# Patient Record
Sex: Female | Born: 1960 | Race: Black or African American | Hispanic: No | Marital: Single | Smoking: Former smoker
Health system: Southern US, Community
[De-identification: ages and names within clinical notes are randomized; demographics above are authoritative.]

## PROBLEM LIST (undated history)

## (undated) DIAGNOSIS — K08109 Complete loss of teeth, unspecified cause, unspecified class: Secondary | ICD-10-CM

## (undated) DIAGNOSIS — R002 Palpitations: Secondary | ICD-10-CM

## (undated) DIAGNOSIS — J449 Chronic obstructive pulmonary disease, unspecified: Secondary | ICD-10-CM

## (undated) DIAGNOSIS — I10 Essential (primary) hypertension: Secondary | ICD-10-CM

## (undated) DIAGNOSIS — D573 Sickle-cell trait: Secondary | ICD-10-CM

## (undated) DIAGNOSIS — R0602 Shortness of breath: Secondary | ICD-10-CM

## (undated) DIAGNOSIS — Z923 Personal history of irradiation: Secondary | ICD-10-CM

## (undated) DIAGNOSIS — J9620 Acute and chronic respiratory failure, unspecified whether with hypoxia or hypercapnia: Secondary | ICD-10-CM

## (undated) DIAGNOSIS — K219 Gastro-esophageal reflux disease without esophagitis: Secondary | ICD-10-CM

## (undated) DIAGNOSIS — C50919 Malignant neoplasm of unspecified site of unspecified female breast: Secondary | ICD-10-CM

## (undated) DIAGNOSIS — IMO0002 Reserved for concepts with insufficient information to code with codable children: Secondary | ICD-10-CM

## (undated) DIAGNOSIS — E663 Overweight: Secondary | ICD-10-CM

## (undated) DIAGNOSIS — D869 Sarcoidosis, unspecified: Secondary | ICD-10-CM

## (undated) DIAGNOSIS — D509 Iron deficiency anemia, unspecified: Secondary | ICD-10-CM

## (undated) DIAGNOSIS — K000 Anodontia: Secondary | ICD-10-CM

## (undated) DIAGNOSIS — F32A Depression, unspecified: Secondary | ICD-10-CM

## (undated) HISTORY — DX: Palpitations: R00.2

## (undated) HISTORY — PX: EYE SURGERY: SHX253

## (undated) HISTORY — DX: Reserved for concepts with insufficient information to code with codable children: IMO0002

## (undated) HISTORY — DX: Sarcoidosis, unspecified: D86.9

## (undated) HISTORY — DX: Essential (primary) hypertension: I10

## (undated) HISTORY — DX: Iron deficiency anemia, unspecified: D50.9

## (undated) HISTORY — DX: Overweight: E66.3

## (undated) HISTORY — DX: Acute and chronic respiratory failure, unspecified whether with hypoxia or hypercapnia: J96.20

---

## 1990-11-13 HISTORY — PX: OTHER SURGICAL HISTORY: SHX169

## 1998-08-31 ENCOUNTER — Encounter: Admission: RE | Admit: 1998-08-31 | Discharge: 1998-08-31 | Payer: Self-pay | Admitting: Family Medicine

## 1998-09-07 ENCOUNTER — Encounter: Admission: RE | Admit: 1998-09-07 | Discharge: 1998-09-07 | Payer: Self-pay | Admitting: Sports Medicine

## 1998-10-11 ENCOUNTER — Encounter: Admission: RE | Admit: 1998-10-11 | Discharge: 1998-10-11 | Payer: Self-pay | Admitting: Family Medicine

## 1999-01-12 ENCOUNTER — Encounter: Admission: RE | Admit: 1999-01-12 | Discharge: 1999-01-12 | Payer: Self-pay | Admitting: Family Medicine

## 1999-04-14 ENCOUNTER — Encounter: Admission: RE | Admit: 1999-04-14 | Discharge: 1999-04-14 | Payer: Self-pay | Admitting: Family Medicine

## 1999-04-16 ENCOUNTER — Emergency Department (HOSPITAL_COMMUNITY): Admission: EM | Admit: 1999-04-16 | Discharge: 1999-04-16 | Payer: Self-pay | Admitting: Emergency Medicine

## 1999-08-14 ENCOUNTER — Encounter: Payer: Self-pay | Admitting: Emergency Medicine

## 1999-08-14 ENCOUNTER — Emergency Department (HOSPITAL_COMMUNITY): Admission: EM | Admit: 1999-08-14 | Discharge: 1999-08-14 | Payer: Self-pay | Admitting: Emergency Medicine

## 1999-12-13 ENCOUNTER — Encounter: Admission: RE | Admit: 1999-12-13 | Discharge: 1999-12-13 | Payer: Self-pay | Admitting: Sports Medicine

## 2000-02-13 ENCOUNTER — Encounter: Admission: RE | Admit: 2000-02-13 | Discharge: 2000-02-13 | Payer: Self-pay | Admitting: Family Medicine

## 2000-07-18 ENCOUNTER — Encounter: Admission: RE | Admit: 2000-07-18 | Discharge: 2000-07-18 | Payer: Self-pay | Admitting: Family Medicine

## 2000-11-12 ENCOUNTER — Encounter: Payer: Self-pay | Admitting: Emergency Medicine

## 2000-11-12 ENCOUNTER — Emergency Department (HOSPITAL_COMMUNITY): Admission: EM | Admit: 2000-11-12 | Discharge: 2000-11-12 | Payer: Self-pay | Admitting: Emergency Medicine

## 2001-01-02 ENCOUNTER — Encounter: Admission: RE | Admit: 2001-01-02 | Discharge: 2001-01-02 | Payer: Self-pay | Admitting: Family Medicine

## 2001-03-21 ENCOUNTER — Encounter (INDEPENDENT_AMBULATORY_CARE_PROVIDER_SITE_OTHER): Payer: Self-pay | Admitting: *Deleted

## 2001-03-21 ENCOUNTER — Encounter: Admission: RE | Admit: 2001-03-21 | Discharge: 2001-03-21 | Payer: Self-pay | Admitting: Family Medicine

## 2001-03-22 ENCOUNTER — Other Ambulatory Visit: Admission: RE | Admit: 2001-03-22 | Discharge: 2001-03-22 | Payer: Self-pay | Admitting: Family Medicine

## 2001-06-24 ENCOUNTER — Emergency Department (HOSPITAL_COMMUNITY): Admission: EM | Admit: 2001-06-24 | Discharge: 2001-06-24 | Payer: Self-pay | Admitting: *Deleted

## 2001-06-25 ENCOUNTER — Encounter: Payer: Self-pay | Admitting: Emergency Medicine

## 2001-07-01 ENCOUNTER — Encounter: Admission: RE | Admit: 2001-07-01 | Discharge: 2001-07-01 | Payer: Self-pay | Admitting: Family Medicine

## 2001-08-20 ENCOUNTER — Encounter: Admission: RE | Admit: 2001-08-20 | Discharge: 2001-08-20 | Payer: Self-pay | Admitting: Family Medicine

## 2001-08-20 ENCOUNTER — Encounter: Payer: Self-pay | Admitting: *Deleted

## 2001-08-20 ENCOUNTER — Encounter: Admission: RE | Admit: 2001-08-20 | Discharge: 2001-08-20 | Payer: Self-pay | Admitting: *Deleted

## 2001-09-05 ENCOUNTER — Encounter: Admission: RE | Admit: 2001-09-05 | Discharge: 2001-09-05 | Payer: Self-pay | Admitting: Family Medicine

## 2001-11-22 ENCOUNTER — Encounter: Admission: RE | Admit: 2001-11-22 | Discharge: 2001-11-22 | Payer: Self-pay | Admitting: Family Medicine

## 2001-12-15 ENCOUNTER — Emergency Department (HOSPITAL_COMMUNITY): Admission: EM | Admit: 2001-12-15 | Discharge: 2001-12-16 | Payer: Self-pay | Admitting: Emergency Medicine

## 2001-12-25 ENCOUNTER — Encounter: Admission: RE | Admit: 2001-12-25 | Discharge: 2001-12-25 | Payer: Self-pay | Admitting: Family Medicine

## 2002-04-10 ENCOUNTER — Encounter: Admission: RE | Admit: 2002-04-10 | Discharge: 2002-04-10 | Payer: Self-pay | Admitting: Family Medicine

## 2002-04-22 ENCOUNTER — Encounter: Admission: RE | Admit: 2002-04-22 | Discharge: 2002-04-22 | Payer: Self-pay | Admitting: Family Medicine

## 2002-05-14 ENCOUNTER — Encounter: Admission: RE | Admit: 2002-05-14 | Discharge: 2002-05-14 | Payer: Self-pay | Admitting: Family Medicine

## 2002-06-10 ENCOUNTER — Encounter: Admission: RE | Admit: 2002-06-10 | Discharge: 2002-06-10 | Payer: Self-pay | Admitting: Sports Medicine

## 2002-10-28 ENCOUNTER — Encounter: Admission: RE | Admit: 2002-10-28 | Discharge: 2002-10-28 | Payer: Self-pay | Admitting: Family Medicine

## 2002-11-01 ENCOUNTER — Encounter: Payer: Self-pay | Admitting: Emergency Medicine

## 2002-11-01 ENCOUNTER — Emergency Department (HOSPITAL_COMMUNITY): Admission: EM | Admit: 2002-11-01 | Discharge: 2002-11-01 | Payer: Self-pay

## 2003-04-01 ENCOUNTER — Encounter: Admission: RE | Admit: 2003-04-01 | Discharge: 2003-04-01 | Payer: Self-pay | Admitting: Family Medicine

## 2003-05-15 ENCOUNTER — Encounter: Admission: RE | Admit: 2003-05-15 | Discharge: 2003-05-15 | Payer: Self-pay | Admitting: Family Medicine

## 2003-08-05 ENCOUNTER — Emergency Department (HOSPITAL_COMMUNITY): Admission: EM | Admit: 2003-08-05 | Discharge: 2003-08-05 | Payer: Self-pay

## 2003-08-05 ENCOUNTER — Encounter: Payer: Self-pay | Admitting: Emergency Medicine

## 2003-08-11 ENCOUNTER — Encounter: Admission: RE | Admit: 2003-08-11 | Discharge: 2003-08-11 | Payer: Self-pay | Admitting: Sports Medicine

## 2004-01-01 ENCOUNTER — Encounter: Admission: RE | Admit: 2004-01-01 | Discharge: 2004-01-01 | Payer: Self-pay | Admitting: Family Medicine

## 2004-01-11 ENCOUNTER — Encounter: Admission: RE | Admit: 2004-01-11 | Discharge: 2004-01-11 | Payer: Self-pay | Admitting: Family Medicine

## 2004-06-06 ENCOUNTER — Encounter: Admission: RE | Admit: 2004-06-06 | Discharge: 2004-06-06 | Payer: Self-pay | Admitting: Family Medicine

## 2004-06-16 ENCOUNTER — Encounter: Admission: RE | Admit: 2004-06-16 | Discharge: 2004-06-16 | Payer: Self-pay | Admitting: Family Medicine

## 2004-07-07 ENCOUNTER — Encounter: Admission: RE | Admit: 2004-07-07 | Discharge: 2004-07-07 | Payer: Self-pay | Admitting: Sports Medicine

## 2004-10-17 ENCOUNTER — Ambulatory Visit: Payer: Self-pay | Admitting: Family Medicine

## 2004-10-25 ENCOUNTER — Emergency Department (HOSPITAL_COMMUNITY): Admission: EM | Admit: 2004-10-25 | Discharge: 2004-10-25 | Payer: Self-pay | Admitting: Family Medicine

## 2004-11-17 ENCOUNTER — Ambulatory Visit: Payer: Self-pay | Admitting: Internal Medicine

## 2004-11-25 ENCOUNTER — Ambulatory Visit: Payer: Self-pay | Admitting: Family Medicine

## 2005-07-25 ENCOUNTER — Ambulatory Visit: Payer: Self-pay | Admitting: Family Medicine

## 2005-08-04 ENCOUNTER — Ambulatory Visit (HOSPITAL_COMMUNITY): Admission: RE | Admit: 2005-08-04 | Discharge: 2005-08-04 | Payer: Self-pay | Admitting: Sports Medicine

## 2005-08-24 ENCOUNTER — Ambulatory Visit: Payer: Self-pay | Admitting: Family Medicine

## 2005-09-26 ENCOUNTER — Ambulatory Visit (HOSPITAL_COMMUNITY): Admission: RE | Admit: 2005-09-26 | Discharge: 2005-09-26 | Payer: Self-pay | Admitting: Obstetrics & Gynecology

## 2005-10-02 ENCOUNTER — Ambulatory Visit: Payer: Self-pay | Admitting: Internal Medicine

## 2005-10-31 ENCOUNTER — Ambulatory Visit (HOSPITAL_COMMUNITY): Admission: RE | Admit: 2005-10-31 | Discharge: 2005-10-31 | Payer: Self-pay | Admitting: Obstetrics & Gynecology

## 2005-11-16 ENCOUNTER — Ambulatory Visit: Payer: Self-pay | Admitting: Internal Medicine

## 2005-11-30 ENCOUNTER — Ambulatory Visit: Payer: Self-pay | Admitting: Family Medicine

## 2005-12-20 ENCOUNTER — Ambulatory Visit: Payer: Self-pay | Admitting: Internal Medicine

## 2005-12-20 ENCOUNTER — Ambulatory Visit (HOSPITAL_COMMUNITY): Admission: RE | Admit: 2005-12-20 | Discharge: 2005-12-20 | Payer: Self-pay | Admitting: Obstetrics & Gynecology

## 2006-01-24 ENCOUNTER — Ambulatory Visit: Payer: Self-pay | Admitting: Family Medicine

## 2006-01-26 ENCOUNTER — Encounter: Admission: RE | Admit: 2006-01-26 | Discharge: 2006-01-26 | Payer: Self-pay | Admitting: Family Medicine

## 2006-02-05 ENCOUNTER — Ambulatory Visit: Payer: Self-pay | Admitting: Family Medicine

## 2006-03-13 ENCOUNTER — Ambulatory Visit: Payer: Self-pay | Admitting: Family Medicine

## 2006-03-20 ENCOUNTER — Ambulatory Visit: Payer: Self-pay | Admitting: Sports Medicine

## 2006-03-20 ENCOUNTER — Ambulatory Visit (HOSPITAL_COMMUNITY): Admission: RE | Admit: 2006-03-20 | Discharge: 2006-03-20 | Payer: Self-pay | Admitting: Family Medicine

## 2006-04-02 ENCOUNTER — Ambulatory Visit: Payer: Self-pay | Admitting: Family Medicine

## 2006-04-05 ENCOUNTER — Ambulatory Visit: Payer: Self-pay | Admitting: Sports Medicine

## 2006-06-22 ENCOUNTER — Ambulatory Visit: Payer: Self-pay | Admitting: Family Medicine

## 2006-08-10 ENCOUNTER — Ambulatory Visit: Payer: Self-pay | Admitting: Family Medicine

## 2006-10-18 ENCOUNTER — Ambulatory Visit: Payer: Self-pay | Admitting: Family Medicine

## 2006-10-19 ENCOUNTER — Encounter (INDEPENDENT_AMBULATORY_CARE_PROVIDER_SITE_OTHER): Payer: Self-pay | Admitting: *Deleted

## 2006-10-19 LAB — CONVERTED CEMR LAB

## 2006-12-07 ENCOUNTER — Ambulatory Visit: Payer: Self-pay | Admitting: Family Medicine

## 2006-12-07 ENCOUNTER — Encounter: Payer: Self-pay | Admitting: Family Medicine

## 2006-12-21 ENCOUNTER — Ambulatory Visit: Payer: Self-pay | Admitting: Family Medicine

## 2007-01-10 DIAGNOSIS — D869 Sarcoidosis, unspecified: Secondary | ICD-10-CM | POA: Insufficient documentation

## 2007-01-10 DIAGNOSIS — D259 Leiomyoma of uterus, unspecified: Secondary | ICD-10-CM | POA: Insufficient documentation

## 2007-01-10 DIAGNOSIS — M545 Low back pain, unspecified: Secondary | ICD-10-CM | POA: Insufficient documentation

## 2007-01-10 DIAGNOSIS — I10 Essential (primary) hypertension: Secondary | ICD-10-CM | POA: Insufficient documentation

## 2007-01-10 DIAGNOSIS — D509 Iron deficiency anemia, unspecified: Secondary | ICD-10-CM | POA: Insufficient documentation

## 2007-01-10 DIAGNOSIS — J309 Allergic rhinitis, unspecified: Secondary | ICD-10-CM | POA: Insufficient documentation

## 2007-01-10 DIAGNOSIS — J449 Chronic obstructive pulmonary disease, unspecified: Secondary | ICD-10-CM | POA: Insufficient documentation

## 2007-01-11 ENCOUNTER — Encounter (INDEPENDENT_AMBULATORY_CARE_PROVIDER_SITE_OTHER): Payer: Self-pay | Admitting: *Deleted

## 2007-02-11 ENCOUNTER — Telehealth: Payer: Self-pay | Admitting: *Deleted

## 2007-02-21 ENCOUNTER — Telehealth: Payer: Self-pay | Admitting: *Deleted

## 2007-03-07 ENCOUNTER — Encounter: Payer: Self-pay | Admitting: Family Medicine

## 2007-03-07 ENCOUNTER — Ambulatory Visit: Payer: Self-pay | Admitting: Sports Medicine

## 2007-03-07 LAB — CONVERTED CEMR LAB
BUN: 8 mg/dL (ref 6–23)
Blood in Urine, dipstick: NEGATIVE
Calcium: 9.3 mg/dL (ref 8.4–10.5)
Creatinine, Ser: 0.73 mg/dL (ref 0.40–1.20)
Glucose, Urine, Semiquant: NEGATIVE
Ketones, urine, test strip: NEGATIVE
Specific Gravity, Urine: 1.01
pH: 7.5

## 2007-03-18 ENCOUNTER — Encounter: Payer: Self-pay | Admitting: Family Medicine

## 2007-04-23 ENCOUNTER — Telehealth (INDEPENDENT_AMBULATORY_CARE_PROVIDER_SITE_OTHER): Payer: Self-pay | Admitting: *Deleted

## 2007-04-23 ENCOUNTER — Telehealth: Payer: Self-pay | Admitting: *Deleted

## 2007-05-15 ENCOUNTER — Ambulatory Visit: Payer: Self-pay | Admitting: Family Medicine

## 2007-05-15 LAB — CONVERTED CEMR LAB: Whiff Test: NEGATIVE

## 2007-06-10 ENCOUNTER — Ambulatory Visit: Payer: Self-pay | Admitting: Family Medicine

## 2007-06-27 ENCOUNTER — Telehealth (INDEPENDENT_AMBULATORY_CARE_PROVIDER_SITE_OTHER): Payer: Self-pay | Admitting: *Deleted

## 2007-07-22 ENCOUNTER — Telehealth: Payer: Self-pay | Admitting: *Deleted

## 2007-07-24 ENCOUNTER — Emergency Department (HOSPITAL_COMMUNITY): Admission: EM | Admit: 2007-07-24 | Discharge: 2007-07-25 | Payer: Self-pay | Admitting: Emergency Medicine

## 2007-08-05 ENCOUNTER — Ambulatory Visit: Payer: Self-pay | Admitting: Family Medicine

## 2007-08-16 ENCOUNTER — Encounter: Admission: RE | Admit: 2007-08-16 | Discharge: 2007-08-16 | Payer: Self-pay | Admitting: Sports Medicine

## 2007-08-19 ENCOUNTER — Encounter (INDEPENDENT_AMBULATORY_CARE_PROVIDER_SITE_OTHER): Payer: Self-pay | Admitting: Family Medicine

## 2007-09-30 ENCOUNTER — Telehealth: Payer: Self-pay | Admitting: *Deleted

## 2007-10-25 ENCOUNTER — Ambulatory Visit: Payer: Self-pay | Admitting: Family Medicine

## 2007-11-25 ENCOUNTER — Ambulatory Visit: Payer: Self-pay | Admitting: Sports Medicine

## 2007-11-25 ENCOUNTER — Encounter (INDEPENDENT_AMBULATORY_CARE_PROVIDER_SITE_OTHER): Payer: Self-pay | Admitting: Family Medicine

## 2007-11-27 ENCOUNTER — Encounter: Admission: RE | Admit: 2007-11-27 | Discharge: 2007-11-27 | Payer: Self-pay | Admitting: Family Medicine

## 2007-11-28 ENCOUNTER — Telehealth: Payer: Self-pay | Admitting: *Deleted

## 2007-11-28 DIAGNOSIS — N840 Polyp of corpus uteri: Secondary | ICD-10-CM | POA: Insufficient documentation

## 2007-12-02 ENCOUNTER — Encounter (INDEPENDENT_AMBULATORY_CARE_PROVIDER_SITE_OTHER): Payer: Self-pay | Admitting: *Deleted

## 2007-12-18 ENCOUNTER — Ambulatory Visit: Payer: Self-pay | Admitting: Internal Medicine

## 2007-12-24 ENCOUNTER — Encounter: Payer: Self-pay | Admitting: Internal Medicine

## 2007-12-25 ENCOUNTER — Emergency Department (HOSPITAL_COMMUNITY): Admission: EM | Admit: 2007-12-25 | Discharge: 2007-12-25 | Payer: Self-pay | Admitting: Emergency Medicine

## 2008-01-02 ENCOUNTER — Encounter: Payer: Self-pay | Admitting: Internal Medicine

## 2008-01-03 ENCOUNTER — Encounter (INDEPENDENT_AMBULATORY_CARE_PROVIDER_SITE_OTHER): Payer: Self-pay | Admitting: Family Medicine

## 2008-01-08 ENCOUNTER — Encounter (INDEPENDENT_AMBULATORY_CARE_PROVIDER_SITE_OTHER): Payer: Self-pay | Admitting: Family Medicine

## 2008-02-19 ENCOUNTER — Ambulatory Visit: Payer: Self-pay | Admitting: Internal Medicine

## 2008-03-26 ENCOUNTER — Ambulatory Visit: Payer: Self-pay | Admitting: Family Medicine

## 2008-03-26 ENCOUNTER — Encounter: Payer: Self-pay | Admitting: Family Medicine

## 2008-03-26 ENCOUNTER — Telehealth: Payer: Self-pay | Admitting: *Deleted

## 2008-03-27 ENCOUNTER — Telehealth: Payer: Self-pay | Admitting: *Deleted

## 2008-03-27 LAB — CONVERTED CEMR LAB
BUN: 9 mg/dL (ref 6–23)
MCHC: 31.5 g/dL (ref 30.0–36.0)
Potassium: 4.1 meq/L (ref 3.5–5.3)
RDW: 17.5 % — ABNORMAL HIGH (ref 11.5–15.5)
Sodium: 139 meq/L (ref 135–145)

## 2008-04-01 ENCOUNTER — Ambulatory Visit: Payer: Self-pay | Admitting: Internal Medicine

## 2008-04-15 ENCOUNTER — Encounter (INDEPENDENT_AMBULATORY_CARE_PROVIDER_SITE_OTHER): Payer: Self-pay | Admitting: Family Medicine

## 2008-04-15 ENCOUNTER — Ambulatory Visit: Payer: Self-pay | Admitting: Family Medicine

## 2008-04-15 DIAGNOSIS — D573 Sickle-cell trait: Secondary | ICD-10-CM | POA: Insufficient documentation

## 2008-04-15 LAB — CONVERTED CEMR LAB: Hemoglobin: 10.4 g/dL

## 2008-05-01 ENCOUNTER — Encounter (INDEPENDENT_AMBULATORY_CARE_PROVIDER_SITE_OTHER): Payer: Self-pay | Admitting: Family Medicine

## 2008-05-06 ENCOUNTER — Telehealth: Payer: Self-pay | Admitting: *Deleted

## 2008-06-11 ENCOUNTER — Telehealth: Payer: Self-pay | Admitting: *Deleted

## 2008-06-17 ENCOUNTER — Ambulatory Visit: Payer: Self-pay | Admitting: Family Medicine

## 2008-06-17 LAB — CONVERTED CEMR LAB: Hemoglobin: 11 g/dL

## 2008-06-23 ENCOUNTER — Telehealth (INDEPENDENT_AMBULATORY_CARE_PROVIDER_SITE_OTHER): Payer: Self-pay | Admitting: Family Medicine

## 2008-06-25 ENCOUNTER — Encounter: Payer: Self-pay | Admitting: Internal Medicine

## 2008-06-25 ENCOUNTER — Ambulatory Visit: Payer: Self-pay | Admitting: Internal Medicine

## 2008-06-26 ENCOUNTER — Ambulatory Visit: Payer: Self-pay | Admitting: Internal Medicine

## 2008-11-10 ENCOUNTER — Encounter (INDEPENDENT_AMBULATORY_CARE_PROVIDER_SITE_OTHER): Payer: Self-pay | Admitting: Family Medicine

## 2008-11-10 ENCOUNTER — Ambulatory Visit: Payer: Self-pay | Admitting: Family Medicine

## 2008-11-10 LAB — CONVERTED CEMR LAB: HDL: 60 mg/dL (ref >39)

## 2008-11-23 ENCOUNTER — Encounter (INDEPENDENT_AMBULATORY_CARE_PROVIDER_SITE_OTHER): Payer: Self-pay | Admitting: Family Medicine

## 2008-11-23 ENCOUNTER — Ambulatory Visit: Payer: Self-pay | Admitting: Gastroenterology

## 2008-11-30 ENCOUNTER — Ambulatory Visit: Payer: Self-pay | Admitting: Gastroenterology

## 2008-12-01 ENCOUNTER — Ambulatory Visit: Payer: Self-pay | Admitting: Family Medicine

## 2008-12-01 ENCOUNTER — Encounter (INDEPENDENT_AMBULATORY_CARE_PROVIDER_SITE_OTHER): Payer: Self-pay | Admitting: Family Medicine

## 2008-12-01 ENCOUNTER — Telehealth (INDEPENDENT_AMBULATORY_CARE_PROVIDER_SITE_OTHER): Payer: Self-pay | Admitting: Family Medicine

## 2008-12-02 LAB — CONVERTED CEMR LAB
HCT: 35.6 % — ABNORMAL LOW (ref 36.0–46.0)
MCV: 74 fL — ABNORMAL LOW (ref 78.0–100.0)
Platelets: 386 10*3/uL (ref 150–400)
WBC: 8.7 10*3/uL (ref 4.0–10.5)

## 2008-12-03 ENCOUNTER — Telehealth (INDEPENDENT_AMBULATORY_CARE_PROVIDER_SITE_OTHER): Payer: Self-pay | Admitting: *Deleted

## 2008-12-08 ENCOUNTER — Ambulatory Visit: Payer: Self-pay | Admitting: Internal Medicine

## 2009-01-01 ENCOUNTER — Encounter (INDEPENDENT_AMBULATORY_CARE_PROVIDER_SITE_OTHER): Payer: Self-pay | Admitting: Family Medicine

## 2009-01-01 ENCOUNTER — Ambulatory Visit: Payer: Self-pay | Admitting: Family Medicine

## 2009-01-04 ENCOUNTER — Telehealth (INDEPENDENT_AMBULATORY_CARE_PROVIDER_SITE_OTHER): Payer: Self-pay | Admitting: Family Medicine

## 2009-01-04 LAB — CONVERTED CEMR LAB
Calcium: 9.3 mg/dL (ref 8.4–10.5)
Chloride: 103 meq/L (ref 96–112)
Creatinine, Ser: 0.63 mg/dL (ref 0.40–1.20)
HCT: 37.8 % (ref 36.0–46.0)
MCHC: 31.2 g/dL (ref 30.0–36.0)
MCV: 77.3 fL — ABNORMAL LOW (ref 78.0–100.0)
Platelets: 373 10*3/uL (ref 150–400)
RDW: 17.3 % — ABNORMAL HIGH (ref 11.5–15.5)

## 2009-01-19 ENCOUNTER — Ambulatory Visit: Payer: Self-pay | Admitting: Family Medicine

## 2009-01-19 ENCOUNTER — Encounter (INDEPENDENT_AMBULATORY_CARE_PROVIDER_SITE_OTHER): Payer: Self-pay | Admitting: *Deleted

## 2009-01-19 ENCOUNTER — Encounter (INDEPENDENT_AMBULATORY_CARE_PROVIDER_SITE_OTHER): Payer: Self-pay | Admitting: Family Medicine

## 2009-01-26 ENCOUNTER — Encounter (INDEPENDENT_AMBULATORY_CARE_PROVIDER_SITE_OTHER): Payer: Self-pay | Admitting: Family Medicine

## 2009-02-16 ENCOUNTER — Ambulatory Visit: Payer: Self-pay | Admitting: Family Medicine

## 2009-02-16 ENCOUNTER — Encounter (INDEPENDENT_AMBULATORY_CARE_PROVIDER_SITE_OTHER): Payer: Self-pay | Admitting: Family Medicine

## 2009-02-16 ENCOUNTER — Encounter: Payer: Self-pay | Admitting: Family Medicine

## 2009-02-16 DIAGNOSIS — R8789 Other abnormal findings in specimens from female genital organs: Secondary | ICD-10-CM | POA: Insufficient documentation

## 2009-02-25 ENCOUNTER — Encounter: Payer: Self-pay | Admitting: Family Medicine

## 2009-03-15 ENCOUNTER — Encounter: Payer: Self-pay | Admitting: Family Medicine

## 2009-03-16 ENCOUNTER — Telehealth (INDEPENDENT_AMBULATORY_CARE_PROVIDER_SITE_OTHER): Payer: Self-pay | Admitting: *Deleted

## 2009-04-02 ENCOUNTER — Ambulatory Visit: Payer: Self-pay | Admitting: Internal Medicine

## 2009-04-16 ENCOUNTER — Ambulatory Visit: Payer: Self-pay | Admitting: Family Medicine

## 2009-04-28 ENCOUNTER — Telehealth: Payer: Self-pay | Admitting: Internal Medicine

## 2009-06-16 ENCOUNTER — Ambulatory Visit: Payer: Self-pay | Admitting: Family Medicine

## 2009-08-31 ENCOUNTER — Encounter: Payer: Self-pay | Admitting: Family Medicine

## 2009-08-31 ENCOUNTER — Ambulatory Visit: Payer: Self-pay | Admitting: Family Medicine

## 2009-09-02 ENCOUNTER — Emergency Department (HOSPITAL_COMMUNITY): Admission: EM | Admit: 2009-09-02 | Discharge: 2009-09-02 | Payer: Self-pay | Admitting: Emergency Medicine

## 2009-09-06 ENCOUNTER — Encounter: Payer: Self-pay | Admitting: Family Medicine

## 2009-09-07 ENCOUNTER — Telehealth: Payer: Self-pay | Admitting: *Deleted

## 2009-09-13 ENCOUNTER — Encounter: Payer: Self-pay | Admitting: Family Medicine

## 2009-09-13 ENCOUNTER — Ambulatory Visit: Payer: Self-pay | Admitting: Family Medicine

## 2009-09-13 LAB — CONVERTED CEMR LAB
CO2: 32 meq/L (ref 19–32)
Glucose, Bld: 79 mg/dL (ref 70–99)
Potassium: 3.5 meq/L (ref 3.5–5.3)
Sodium: 141 meq/L (ref 135–145)

## 2009-09-14 ENCOUNTER — Encounter: Payer: Self-pay | Admitting: Family Medicine

## 2009-10-11 ENCOUNTER — Encounter: Payer: Self-pay | Admitting: Family Medicine

## 2009-10-12 ENCOUNTER — Ambulatory Visit: Payer: Self-pay | Admitting: Internal Medicine

## 2009-10-28 ENCOUNTER — Ambulatory Visit: Payer: Self-pay | Admitting: Family Medicine

## 2009-10-28 ENCOUNTER — Encounter: Payer: Self-pay | Admitting: Family Medicine

## 2009-11-01 ENCOUNTER — Telehealth (INDEPENDENT_AMBULATORY_CARE_PROVIDER_SITE_OTHER): Payer: Self-pay | Admitting: *Deleted

## 2010-01-12 ENCOUNTER — Emergency Department (HOSPITAL_COMMUNITY): Admission: EM | Admit: 2010-01-12 | Discharge: 2010-01-12 | Payer: Self-pay | Admitting: Family Medicine

## 2010-01-13 ENCOUNTER — Telehealth: Payer: Self-pay | Admitting: Family Medicine

## 2010-01-18 ENCOUNTER — Encounter: Payer: Self-pay | Admitting: Family Medicine

## 2010-01-18 ENCOUNTER — Ambulatory Visit: Payer: Self-pay | Admitting: Internal Medicine

## 2010-03-02 ENCOUNTER — Ambulatory Visit: Payer: Self-pay | Admitting: Internal Medicine

## 2010-03-02 ENCOUNTER — Encounter: Payer: Self-pay | Admitting: Internal Medicine

## 2010-03-11 ENCOUNTER — Encounter: Payer: Self-pay | Admitting: Family Medicine

## 2010-03-11 ENCOUNTER — Observation Stay (HOSPITAL_COMMUNITY): Admission: EM | Admit: 2010-03-11 | Discharge: 2010-03-12 | Payer: Self-pay | Admitting: Emergency Medicine

## 2010-03-11 ENCOUNTER — Ambulatory Visit: Payer: Self-pay | Admitting: Family Medicine

## 2010-03-11 ENCOUNTER — Encounter: Payer: Self-pay | Admitting: Internal Medicine

## 2010-03-14 ENCOUNTER — Ambulatory Visit: Payer: Self-pay | Admitting: Family Medicine

## 2010-03-14 LAB — CONVERTED CEMR LAB
CO2: 32 meq/L
Chloride: 103 meq/L
Glucose, Bld: 70 mg/dL
Platelets: 342 10*3/uL
Potassium: 3.2 meq/L
Sodium: 140 meq/L

## 2010-03-17 ENCOUNTER — Encounter: Admission: RE | Admit: 2010-03-17 | Discharge: 2010-03-17 | Payer: Self-pay | Admitting: Family Medicine

## 2010-04-12 ENCOUNTER — Telehealth: Payer: Self-pay | Admitting: Internal Medicine

## 2010-05-12 ENCOUNTER — Telehealth: Payer: Self-pay | Admitting: Family Medicine

## 2010-05-13 ENCOUNTER — Ambulatory Visit: Payer: Self-pay | Admitting: Family Medicine

## 2010-05-13 DIAGNOSIS — R300 Dysuria: Secondary | ICD-10-CM | POA: Insufficient documentation

## 2010-05-13 DIAGNOSIS — N3000 Acute cystitis without hematuria: Secondary | ICD-10-CM | POA: Insufficient documentation

## 2010-05-13 LAB — CONVERTED CEMR LAB
Glucose, Urine, Semiquant: NEGATIVE
Ketones, urine, test strip: NEGATIVE
Specific Gravity, Urine: 1.015
pH: 7

## 2010-05-24 ENCOUNTER — Telehealth (INDEPENDENT_AMBULATORY_CARE_PROVIDER_SITE_OTHER): Payer: Self-pay | Admitting: *Deleted

## 2010-06-08 ENCOUNTER — Ambulatory Visit: Payer: Self-pay | Admitting: Internal Medicine

## 2010-06-15 ENCOUNTER — Telehealth (INDEPENDENT_AMBULATORY_CARE_PROVIDER_SITE_OTHER): Payer: Self-pay | Admitting: *Deleted

## 2010-06-17 ENCOUNTER — Ambulatory Visit: Payer: Self-pay | Admitting: Family Medicine

## 2010-06-17 DIAGNOSIS — N898 Other specified noninflammatory disorders of vagina: Secondary | ICD-10-CM | POA: Insufficient documentation

## 2010-06-27 ENCOUNTER — Telehealth (INDEPENDENT_AMBULATORY_CARE_PROVIDER_SITE_OTHER): Payer: Self-pay | Admitting: *Deleted

## 2010-07-20 ENCOUNTER — Telehealth (INDEPENDENT_AMBULATORY_CARE_PROVIDER_SITE_OTHER): Payer: Self-pay | Admitting: *Deleted

## 2010-08-11 ENCOUNTER — Telehealth (INDEPENDENT_AMBULATORY_CARE_PROVIDER_SITE_OTHER): Payer: Self-pay | Admitting: *Deleted

## 2010-10-01 ENCOUNTER — Emergency Department (HOSPITAL_COMMUNITY)
Admission: EM | Admit: 2010-10-01 | Discharge: 2010-10-01 | Payer: Self-pay | Source: Home / Self Care | Admitting: Emergency Medicine

## 2010-10-04 ENCOUNTER — Telehealth: Payer: Self-pay | Admitting: Internal Medicine

## 2010-10-27 ENCOUNTER — Ambulatory Visit: Payer: Self-pay | Admitting: Internal Medicine

## 2010-11-08 ENCOUNTER — Encounter: Payer: Self-pay | Admitting: Family Medicine

## 2010-11-08 ENCOUNTER — Ambulatory Visit
Admission: RE | Admit: 2010-11-08 | Discharge: 2010-11-08 | Payer: Self-pay | Source: Home / Self Care | Attending: Family Medicine | Admitting: Family Medicine

## 2010-11-08 DIAGNOSIS — N760 Acute vaginitis: Secondary | ICD-10-CM | POA: Insufficient documentation

## 2010-11-08 LAB — CONVERTED CEMR LAB
Chlamydia, DNA Probe: NEGATIVE
GC Probe Amp, Genital: NEGATIVE

## 2010-11-18 ENCOUNTER — Telehealth: Payer: Self-pay | Admitting: Internal Medicine

## 2010-12-03 ENCOUNTER — Encounter: Payer: Self-pay | Admitting: Obstetrics & Gynecology

## 2010-12-04 ENCOUNTER — Encounter: Payer: Self-pay | Admitting: Sports Medicine

## 2010-12-04 ENCOUNTER — Encounter: Payer: Self-pay | Admitting: Family Medicine

## 2010-12-09 ENCOUNTER — Telehealth (INDEPENDENT_AMBULATORY_CARE_PROVIDER_SITE_OTHER): Payer: Self-pay | Admitting: *Deleted

## 2010-12-13 NOTE — Progress Notes (Signed)
  Phone Note Other Incoming   Request: Send information Action Taken: Software engineer of Call: Request for records received from [facility]. Request forwarded to [facility] for records for the last 5 years.

## 2010-12-13 NOTE — Progress Notes (Signed)
Summary: samples of symbicort and spiriva  Phone Note Call from Patient   Caller: Patient Call For: Kennadie Brenner Summary of Call: pt would like samples of symbicort and spiriva. Initial call taken by: Rickard Patience,  October 04, 2010 9:27 AM  Follow-up for Phone Call        pt advised enough sampes at front to last until upcoming appt on 10-27-10.Carron Curie CMA  October 04, 2010 9:57 AM

## 2010-12-13 NOTE — Assessment & Plan Note (Signed)
Summary: Pulmonary/ ext ov with hfa teaching 75%   Primary Provider/Referring Provider:  Asher Muir MD  CC:  Acute visit.  Pt c/o increased SOB with or without exertion x 1wk.  She states that was was seen at urgent care 6 days ago and was given zpack and medrol dose pack.  She also c/o prod cough with green sputum x 1 wk.  .  History of Present Illness: 50  yowbf  quit smoking 12/05 diagnosed by transbronchial biopsy 5/92 with NCG inflammatory change consistent with sarcoid.  Since that time she's been off and on prednisone several times with symptoms of coughing dyspnea and skin involvement.  The last time she was off prednisone was in 2007 but she flared up with skin changes, anorexia and weight loss and therefore is been on prednisone ever since 2007 having tapered down now to 5 mg per day.  Overall she's been doing much better since she stopped smoking 12/05 and much better since change to Symbicort 160/4.5 2 two times a day, able to do adl's ok without limitations including some steps. No nocturnal exac, excess am sputum, nasal co's or over reflux symptoms.   June 26, 2008 returns for PFT's still prednisone 5mg  one daily plus symbicort 160, no new problems.  Apr 02, 2009 taper prednisone 5 mg one a/w a half found out she got nausea and weak for 2 weeks. no rash, no increase sob or need for rescue.  never able to go below 5 mg every other day.  October 12, 2009 ov 6 month followup.  Pt c/o wheezing x 3 wks.  She states that "clears up and then comes right back".  Also she c/o some increased SOIB since ran out of her symbicort 3 days ago.  no ocular or articular co's .  no rash. rec resume symbicort and prednsione 5mg  every other day  January 18, 2010 new doe x steps  c/o feeling dizzy and lightheaded x 4 days.  He also c/o slight headache.  Pt states that she has some tightness in chest that comes and goes and SOB with exertion. sputum is yellow to green in am with lots of congestion  but minimal sleeep disruption.  Pt denies any significant sore throat, dysphagia, itching, sneezing,  nasal congestion or excess secretions,  fever, chills, sweats, unintended wt loss, pleuritic or exertional cp, hempoptysis,  orthopnea pnd or leg swelling    Current Medications (verified): 1)  Symbicort 160-4.5 Mcg/act  Aero (Budesonide-Formoterol Fumarate) .... 2 Puffs Each Am and 2 Each Pm 2)  Hydrochlorothiazide 25 Mg Tabs (Hydrochlorothiazide) .... Take 1 Tablet By Mouth Once A Day 3)  Os-Cal 500 + D 500-400 Mg-Unit  Chew (Calcium Carbonate-Vitamin D) .Marland Kitchen.. 1 Tab By Mouth Two Times A Day 4)  Ferrous Sulfate 325 (65 Fe) Mg  Tabs (Ferrous Sulfate) .... Take One Tablet By Mouth Every Other Day 5)  Proair Hfa 108 (90 Base) Mcg/act  Aers (Albuterol Sulfate) .Marland Kitchen.. 1-2 Puffs Every 4-6 Hours As Needed 6)  Prednisone 5 Mg Tabs (Prednisone) .... As Directed 7)  Advil 200 Mg Tabs (Ibuprofen) .... As Needed 8)  Flonase 50 Mcg/act Susp (Fluticasone Propionate) .... 2 Sprays Each Nostril Daily As Needed For Sinus Pain and Swelling and Drainage 9)  Ibuprofen 800 Mg Tabs (Ibuprofen) .Marland Kitchen.. 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 Marga Melnick     -Daily prednisone since 2007 with ? adrenal insufficiency iatrogenic ? 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 (ICD-280.9)    Vital Signs:  Patient profile:   50 year old female Weight:      175 pounds O2 Sat:      93 % on Room air Temp:     98.0 degrees F oral Pulse rate:   99 / minute BP sitting:   122 / 82  (left arm)  Vitals Entered By: Vernie Murders (January 18, 2010 10:35 AM)  O2 Flow:  Room air  Physical Exam  Additional Exam:  wt 160 > 166 October 12, 2009 > 175 January 18, 2010  amb bf nad HEENT mild turbinate edema.  Oropharynx no thrush or excess pnd or cobblestoning.  No JVD or cervical adenopathy. Mild accessory muscle hypertrophy. Trachea midline, nl thryroid. Chest was hyperinflated by percussion with diminished breath sounds and moderate increased exp time without wheeze. Hoover sign positive at mid inspiration. Regular rate and rhythm without murmur gallop or rub or increase P2 or edema.  Abd: no hsm, nl excursion. Ext warm without cyanosis or clubbing.       CXR  Procedure date:  01/18/2010  Findings:      Comparison: 06/26/2008   Findings: The cardiac silhouette, mediastinal and hilar contours are within normal limits.  There are chronic interstitial scarring type changes/pulmonary fibrosis but no acute overlying pulmonary process.   IMPRESSION: Chronic lung changes/pulmonary fibrosis without definite acute overlying pulmonary findings.    Impression & Recommendations:  Problem # 1:  COPD (ICD-496) DDX of  difficult airways managment all start with A and  include Adherence, Ace Inhibitors, Acid Reflux, Active Sinus Disease, Alpha 1 Antitripsin deficiency, Anxiety masquerading as Airways dz,  ABPA,  allergy(esp in young), Aspiration (esp in elderly), Adverse effects of DPI,  Active smokers, plus one B  = Beta blocker use.Marland Kitchen    Active sinustis:  augmentin and short burst higher dose pred  Adherence:  I spent extra time with the patient today explaining optimal mdi  technique.  This improved from   50-75%  Acid Reflux: consider adding emprically if not improving.  Problem # 2:  SARCOIDOSIS (ICD-135) The goal with a chronic steroid dependent illness is always arriving at the lowest effective dose that controls the disease/symptoms and not accepting a set "formula" which is based on statistics that don't take into accound individual variability or the natural hx of the dz in every individual patient, which may well vary over time.   Try 10 as ceiling as 5 mg every other day as floor  Problem # 3:  RHINITIS, ALLERGIC (ICD-477.9) See instructions for specific recommendations  Her updated medication list for this problem includes:    Flonase 50 Mcg/act Susp (Fluticasone propionate) .Marland Kitchen... 2 sprays each nostril daily as needed for sinus pain and swelling and drainage  Medications Added to Medication List This Visit: 1)  Prednisone 5 Mg Tabs (Prednisone) .Marland KitchenMarland KitchenMarland Kitchen  5 mg every other day 2)  Augmentin 875-125 Mg Tabs (Amoxicillin-pot clavulanate) .... By mouth twice daily  Other Orders: T-2 View CXR (71020TC) T-2 View CXR (71020TC) Est. Patient Level IV (16109)  Patient Instructions: 1)  I emphasized that Flonase has no immediate benefit in terms of improving symptoms.  To help them reached the target tissue, the patient should use Afrin two puffs every 12 hours applied one min before using the nasal steroids.  Afrin should be stopped after no more than 5 days.  If the symptoms worsen, Afrin can be restarted after 5 days off of therapy to prevent rebound congestion from overuse of Afrin.  I also emphasized that in no way are nasal steroids a concern in terms of "addiction". 2)  Prednisone ceiling 5 mg 2 daily until better then wean to 5 mg every other day as a floor 3)  Augmentin should turn the mucus white, if not call back 4)  Please schedule a follow-up appointment in 6 weeks, sooner if needed with pft's on return 5)  Work on inhaler technique:  relax and blow all the way out then take a nice  smooth deep breath back in, triggering the inhaler at same time you start breathing in and hold breath for a few secs 6)    Prescriptions: AUGMENTIN 875-125 MG  TABS (AMOXICILLIN-POT CLAVULANATE) By mouth twice daily  #20 x 0   Entered and Authorized by:   Nyoka Cowden MD   Signed by:   Nyoka Cowden MD on 01/18/2010   Method used:   Electronically to        [facility]* (retail)       [address]       [place], [place]  [id number]       Ph: [phone]       Fax: [phone]   RxID:   [id number]    CXR  Procedure date:  01/18/2010  Findings:      Comparison: 06/26/2008   Findings: The cardiac silhouette, mediastinal and hilar contours are within normal limits.  There are chronic interstitial scarring type changes/pulmonary fibrosis but no acute overlying pulmonary process.   IMPRESSION: Chronic lung changes/pulmonary fibrosis without definite acute overlying pulmonary findings.

## 2010-12-13 NOTE — Assessment & Plan Note (Signed)
Summary: Pulmonary/ ext f/u ov, trial of spiriva   Primary Provider/Referring Provider:  Asher Muir MD  CC:  6 wk followup with PFT's.  Pt states that her breathing is much better.  She states that she has notices some wheezing with exertion and relates this to pollen allergy.Marland Kitchen  History of Present Illness: 49  yowbf  quit smoking 12/05 diagnosed by transbronchial biopsy 5/92 with NCG inflammatory change consistent with sarcoid.  Since that time she's been off and on prednisone several times with symptoms of coughing dyspnea and skin involvement.  The last time she was off prednisone was in 2007 but she flared up with skin changes, anorexia and weight loss and therefore is been on prednisone ever since 2007 having tapered down now to 5 mg per day.  Overall she's been doing much better since she stopped smoking 12/05 and much better since change to Symbicort 160/4.5 2 two times a day, able to do adl's ok without limitations including some steps. No nocturnal exac, excess am sputum, nasal co's or over reflux symptoms.   June 26, 2008 returns for PFT's still prednisone 5mg  one daily plus symbicort 160, no new problems.  Apr 02, 2009 taper prednisone 5 mg one a/w a half found out she got nausea and weak for 2 weeks. no rash, no increase sob or need for rescue.  never able to go below 5 mg every other day.  October 12, 2009 ov 6 month followup.  Pt c/o wheezing x 3 wks.  She states that "clears up and then comes right back".  Also she c/o some increased SOIB since ran out of her symbicort 3 days ago.  no ocular or articular co's .  no rash. rec resume symbicort and prednsione 5mg  every other day  March 02, 2010  6 wk followup with PFT's.  Pt states that her breathing is much better.  She states that she has notices some wheezing with exertion and relates this to pollen allergy. no sweats, unintended wt loss, pleuritic or exertional cp, hempoptysis,  orthopnea pnd or leg swelling  no rash, eyes  ok no new arthritis.Marland Kitchen  limited can't walk fast or exercies. Pt also denies any obvious fluctuation in symptoms with weather or environmental change or other alleviating or aggravating factors.       Current Medications (verified): 1)  Symbicort 160-4.5 Mcg/act  Aero (Budesonide-Formoterol Fumarate) .... 2 Puffs Each Am and 2 Each Pm 2)  Hydrochlorothiazide 25 Mg Tabs (Hydrochlorothiazide) .... Take 1 Tablet By Mouth Once A Day 3)  Os-Cal 500 + D 500-400 Mg-Unit  Chew (Calcium Carbonate-Vitamin D) .Marland Kitchen.. 1 Tab By Mouth Two Times A Day 4)  Ferrous Sulfate 325 (65 Fe) Mg  Tabs (Ferrous Sulfate) .... Take One Tablet By Mouth Every Other Day 5)  Proair Hfa 108 (90 Base) Mcg/act  Aers (Albuterol Sulfate) .Marland Kitchen.. 1-2 Puffs Every 4-6 Hours As Needed 6)  Prednisone 5 Mg Tabs (Prednisone) .... 5 Mg Every Other Day 7)  Advil 200 Mg Tabs (Ibuprofen) .... As Needed 8)  Flonase 50 Mcg/act Susp (Fluticasone Propionate) .... 2 Sprays Each Nostril Daily As Needed For Sinus Pain and Swelling and Drainage 9)  Ibuprofen 800 Mg Tabs (Ibuprofen) .Marland Kitchen.. 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 Marga Melnick     -Daily prednisone since 2007 with ? adrenal insufficiency iatrogenic ? 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 (ICD-280.9)    Vital Signs:  Patient profile:   50 year old female Weight:      166 pounds O2 Sat:      96 % on Room air Temp:     98.2 degrees F oral Pulse rate:   95 / minute BP sitting:   142 / 86  (left arm)  Vitals Entered By: Vernie Murders (March 02, 2010 11:13 AM)  O2 Flow:  Room air  Physical Exam  Additional Exam:  wt  166 October 12, 2009 > 175 January 18, 2010  amb bf nad HEENT mild turbinate edema.  Oropharynx no thrush or excess pnd or cobblestoning.  No JVD or cervical adenopathy. Mild accessory muscle hypertrophy. Trachea midline, nl thryroid. Chest was hyperinflated by percussion with diminished breath sounds and moderate increased exp time without wheeze. Hoover sign positive at mid inspiration. Regular rate and rhythm without murmur gallop or rub or increase P2 or edema.  Abd: no hsm, nl excursion. Ext warm without cyanosis or clubbing.     Impression & Recommendations:  Problem # 1:  COPD (ICD-496) GOLD III with no sign asthmatic component demonstrated while on symbicort so try adding spiriva  I spent extra time with the patient today explaining optimal MDI  technique.  This improved from  50-75%  I spent extra time with the patient today explaining optimal DPI  technique.  This improved from   75-90%.   Each maintenance medication was reviewed in detail including most importantly the difference between maintenance and as needed and under what circumstances the prns are to be used. See instructions for specific recommendations   Medications Added to Medication List This Visit: 1)  Spiriva Handihaler 18 Mcg Caps (Tiotropium bromide monohydrate) .... Two puffs in handihaler daily  Other Orders: Est. Patient Level IV (16109)  Patient Instructions: 1)  I think of spiriva in this setting like purchasing high octane fuel for an older car with lots of miles on it. It may help the  perfomance enough to warrant the purchase, but it won't change the longevity of the car or make it any easier parking it. It should improve peak performance if the patient is patient and lets the medicine work the way it's intended  - improving activity tolerance where limits on the mechanical ventilatory ceiling causing dynamic hyperinflation is the problem.  2)  If you like the benefit of spiriva, fill the prescription 3)  Return to office in 3 months, sooner if needed  Prescriptions: SPIRIVA HANDIHALER 18 MCG  CAPS (TIOTROPIUM BROMIDE MONOHYDRATE) Two puffs in handihaler daily  #34 x 11   Entered and Authorized by:   Nyoka Cowden MD   Signed by:   Nyoka Cowden MD on 03/02/2010   Method used:   Print then Give to Patient  RxID:   [id number]

## 2010-12-13 NOTE — Progress Notes (Signed)
Summary: samples  Phone Note Call from Patient   Caller: Patient Call For: wert Summary of Call: need samples of spiriva and symbicort leave message if no answer Initial call taken by: Rickard Patience,  May 24, 2010 2:08 PM  Follow-up for Phone Call        1 sample of each left out front lm for pt to p/u Follow-up by: Philipp Deputy CMA,  May 24, 2010 2:56 PM

## 2010-12-13 NOTE — Miscellaneous (Signed)
Summary: Orders Update pft charges  Clinical Lists Changes  Orders: Added new Service order of Carbon Monoxide diffusing w/capacity (94720) - Signed Added new Service order of Lung Volumes (94240) - Signed Added new Service order of Spirometry (Pre & Post) (94060) - Signed 

## 2010-12-13 NOTE — Assessment & Plan Note (Signed)
Summary: yeast inf?,df   Vital Signs:  Patient profile:   50 year old female Weight:      172.9 pounds Temp:     98.3 degrees F oral Pulse rate:   76 / minute Pulse rhythm:   regular BP sitting:   134 / 82  (left arm) Cuff size:   regular  Vitals Entered By: Loralee Pacas CMA (June 17, 2010 3:47 PM) CC: ?yeast infection   Primary Care Provider:  Asher Muir MD  CC:  ?yeast infection.  History of Present Illness: Vag discharge and itching.  Recent antibiotic therapy.  Not STD risk.  No  recent partners  Hx of repeated bacterial vaginosis.  Current Medications (verified): 1)  Symbicort 160-4.5 Mcg/act  Aero (Budesonide-Formoterol Fumarate) .... 2 Puffs Each Am and 2 Each Pm 2)  Hydrochlorothiazide 25 Mg Tabs (Hydrochlorothiazide) .... Take 1 Tablet By Mouth Once A Day 3)  Os-Cal 500 + D 500-400 Mg-Unit  Chew (Calcium Carbonate-Vitamin D) .Marland Kitchen.. 1 Tab By Mouth Two Times A Day 4)  Ferrous Sulfate 325 (65 Fe) Mg  Tabs (Ferrous Sulfate) .... Take One Tablet By Mouth Every Other Day 5)  Proair Hfa 108 (90 Base) Mcg/act  Aers (Albuterol Sulfate) .Marland Kitchen.. 1-2 Puffs Every 4-6 Hours As Needed 6)  Prednisone 5 Mg Tabs (Prednisone) .... 5 Mg Every Other Day 7)  Advil 200 Mg Tabs (Ibuprofen) .... As Needed 8)  Flonase 50 Mcg/act Susp (Fluticasone Propionate) .... 2 Sprays Each Nostril Daily As Needed For Sinus Pain and Swelling and Drainage 9)  Ibuprofen 800 Mg Tabs (Ibuprofen) .Marland Kitchen.. 1 Tab By Mouth Every 8 Hours As Needed For Pain and Swelling 10)  Spiriva Handihaler 18 Mcg  Caps (Tiotropium Bromide Monohydrate) .... Two Puffs in Handihaler Daily 11)  Tramadol Hcl 50 Mg Tabs (Tramadol Hcl) .Marland Kitchen.. 1 Tab By Mouth Q 6 Hours As Needed Pain 12)  Omeprazole 40 Mg Cpdr (Omeprazole) .Marland Kitchen.. 1 Tab By Mouth Daily For Stomach 13)  Diflucan 150 Mg Tabs (Fluconazole) .... Once Daily 14)  Metronidazole 500 Mg  Tabs (Metronidazole) .... One By Mouth Two Times A Day  Allergies (verified): No Known Drug  Allergies  Physical Exam  General:  Well-developed,well-nourished,in no acute distress; alert,appropriate and cooperative throughout examination Abdomen:  Bowel sounds positive,abdomen soft and non-tender without masses, organomegaly or hernias noted.   Impression & Recommendations:  Problem # 1:  VAGINAL DISCHARGE (ICD-623.5) Wet prep confirms bacterial vaginosis.  I will rx for yeast too, given itching and recent antibiotic exposure. Orders: Wet Prep- [facility] (29562) [facility]- Est Level  3 (13086)  Complete Medication List: 1)  Symbicort 160-4.5 Mcg/act Aero (Budesonide-formoterol fumarate) .... 2 puffs each am and 2 each pm 2)  Hydrochlorothiazide 25 Mg Tabs (Hydrochlorothiazide) .... Take 1 tablet by mouth once a day 3)  Os-cal 500 + D 500-400 Mg-unit Chew (Calcium carbonate-vitamin d) .Marland Kitchen.. 1 tab by mouth two times a day 4)  Ferrous Sulfate 325 (65 Fe) Mg Tabs (Ferrous sulfate) .... Take one tablet by mouth every other day 5)  Proair Hfa 108 (90 Base) Mcg/act Aers (Albuterol sulfate) .Marland Kitchen.. 1-2 puffs every 4-6 hours as needed 6)  Prednisone 5 Mg Tabs (Prednisone) .... 5 mg every other day 7)  Advil 200 Mg Tabs (Ibuprofen) .... As needed 8)  Flonase 50 Mcg/act Susp (Fluticasone propionate) .... 2 sprays each nostril daily as needed for sinus pain and swelling and drainage 9)  Ibuprofen 800 Mg Tabs (Ibuprofen) .Marland Kitchen.. 1 tab by mouth every 8  hours as needed for pain and swelling 10)  Spiriva Handihaler 18 Mcg Caps (Tiotropium bromide monohydrate) .... Two puffs in handihaler daily 11)  Tramadol Hcl 50 Mg Tabs (Tramadol hcl) .Marland Kitchen.. 1 tab by mouth q 6 hours as needed pain 12)  Omeprazole 40 Mg Cpdr (Omeprazole) .Marland Kitchen.. 1 tab by mouth daily for stomach 13)  Diflucan 150 Mg Tabs (Fluconazole) .... Once daily 14)  Metronidazole 500 Mg Tabs (Metronidazole) .... One by mouth two times a day Prescriptions: HYDROCHLOROTHIAZIDE 25 MG TABS (HYDROCHLOROTHIAZIDE) Take 1 tablet by mouth once a day  #30 x 12    Entered and Authorized by:   Doralee Albino MD   Signed by:   Doralee Albino MD on 06/17/2010   Method used:   Electronically to        [facility] [phone]* (retail)       [address]       [place], [place]  [address]       Ph: [phone]       Fax: [phone]   RxID:   [id number] METRONIDAZOLE 500 MG  TABS (METRONIDAZOLE) one by mouth two times a day  #20 x 0   Entered and Authorized by:   Doralee Albino MD   Signed by:   Doralee Albino MD on 06/17/2010   Method used:   Electronically to        [facility] [phone]* (retail)       [address]       [place], [place]  [address]       Ph: [phone]       Fax: [phone]   RxID:   [id number] DIFLUCAN 150 MG TABS (FLUCONAZOLE) once daily  #1 x 0   Entered and Authorized by:   Doralee Albino MD   Signed by:   Doralee Albino MD on 06/17/2010   Method used:   Electronically to        [facility] [phone]* (retail)       [address]       [place], [place]  [address]       Ph: [phone]       Fax: [phone]   RxID:   [id number]   Laboratory Results  Date/Time Received: June 17, 2010 3:52 PM  Date/Time Reported: June 17, 2010 3:59 PM   [employer] Source: vaginal WBC/hpf: 1-5 Bacteria/hpf: 3+  Cocci Clue cells/hpf: few  Positive whiff Yeast/hpf: none Trichomonas/hpf: none Comments: ...........test performed by...........Marland KitchenTerese Door, CMA

## 2010-12-13 NOTE — Progress Notes (Signed)
Summary: samples  Phone Note Call from Patient   Caller: Patient Call For: [facility] Summary of Call: pt requests samples of symbicort and spiriva. [phone]. Pt is at work so asks that you just leave a msg.  Initial call taken by: Tivis Ringer, CNA,  July 20, 2010 9:16 AM  Follow-up for Phone Call        1 sample of spiriva and symbibort 160 at front desk for pt to pick up -- left message on pt's VM as requested informing her samples at front desk to pick up and call office back for futher questions.  Spiriva: Lot # [id number], Exp Date July 2012 Symbicort 160: Lot # [id number], eXP dATE 09/2011 Follow-up by: Gweneth Dimitri RN,  July 20, 2010 9:58 AM

## 2010-12-13 NOTE — Assessment & Plan Note (Signed)
Summary: bladder inf,tcb   Vital Signs:  Patient profile:   50 year old female Height:      65.5 inches Weight:      174.8 pounds BMI:     28.75 Temp:     98.4 degrees F oral Pulse rate:   88 / minute BP sitting:   143 / 85  (left arm) Cuff size:   regular  Vitals Entered By: Gladstone Pih (May 13, 2010 3:15 PM) CC: C/O dysuria and voiding small amts Is Patient Diabetic? No Pain Assessment Patient in pain? no        Primary Provider:  Asher Muir MD  CC:  C/O dysuria and voiding small amts.  History of Present Illness: 1. UTI symptoms- Pt has had urinary frequency and hesitancy for a couple of days.  Also admits to dysuria and suprapubic pain.  She has not had an UTI in a long time, but states this feels like it did the last time she had one.  She does have some slight white discharge and states she took diflucan yesterday for this.  She has tried OTC NSAIDS for pain as well as cranberry pills.  She denies fever/chills, flank or back pain, n/v/d.  Habits & Providers  Alcohol-Tobacco-Diet     Tobacco Status: quit     Year Quit: 2005  Current Medications (verified): 1)  Symbicort 160-4.5 Mcg/act  Aero (Budesonide-Formoterol Fumarate) .... 2 Puffs Each Am and 2 Each Pm 2)  Hydrochlorothiazide 25 Mg Tabs (Hydrochlorothiazide) .... Take 1 Tablet By Mouth Once A Day 3)  Os-Cal 500 + D 500-400 Mg-Unit  Chew (Calcium Carbonate-Vitamin D) .Marland Kitchen.. 1 Tab By Mouth Two Times A Day 4)  Ferrous Sulfate 325 (65 Fe) Mg  Tabs (Ferrous Sulfate) .... Take One Tablet By Mouth Every Other Day 5)  Proair Hfa 108 (90 Base) Mcg/act  Aers (Albuterol Sulfate) .Marland Kitchen.. 1-2 Puffs Every 4-6 Hours As Needed 6)  Prednisone 5 Mg Tabs (Prednisone) .... 5 Mg Every Other Day 7)  Advil 200 Mg Tabs (Ibuprofen) .... As Needed 8)  Flonase 50 Mcg/act Susp (Fluticasone Propionate) .... 2 Sprays Each Nostril Daily As Needed For Sinus Pain and Swelling and Drainage 9)  Ibuprofen 800 Mg Tabs (Ibuprofen) .Marland Kitchen.. 1 Tab By  Mouth Every 8 Hours As Needed For Pain and Swelling 10)  Spiriva Handihaler 18 Mcg  Caps (Tiotropium Bromide Monohydrate) .... Two Puffs in Handihaler Daily 11)  Tramadol Hcl 50 Mg Tabs (Tramadol Hcl) .Marland Kitchen.. 1 Tab By Mouth Q 6 Hours As Needed Pain 12)  Omeprazole 40 Mg Cpdr (Omeprazole) .Marland Kitchen.. 1 Tab By Mouth Daily For Stomach 13)  Bactrim Ds 800-160 Mg Tabs (Sulfamethoxazole-Trimethoprim) .Marland Kitchen.. 1 By Mouth Bid  Allergies (verified): No Known Drug Allergies  Social History: Smoking Status:  quit  Review of Systems       Pertinent positives and negatives noted in HPI, Vitals signs noted   Physical Exam  General:  alert, well-developed, and well-nourished.   Lungs:  CTAB, normal respiratory effort.   Heart:  normal rate, regular rhythm, no murmur, no gallop, and no rub.   Abdomen:  soft with some mild suprapubic tenderness.  No CVA tenderness.     Impression & Recommendations:  Problem # 1:  ACUTE CYSTITIS (ICD-595.0)  Given prescription for bactrim.  UA and microscopy also shows component of possible BV.  If she doesn't improve suggest treating the BV once UTI is clear. Her updated medication list for this problem includes:  Bactrim Ds 800-160 Mg Tabs (Sulfamethoxazole-trimethoprim) .Marland Kitchen... 1 by mouth bid  Orders: [facility]- Est Level  3 (14782)  Complete Medication List: 1)  Symbicort 160-4.5 Mcg/act Aero (Budesonide-formoterol fumarate) .... 2 puffs each am and 2 each pm 2)  Hydrochlorothiazide 25 Mg Tabs (Hydrochlorothiazide) .... Take 1 tablet by mouth once a day 3)  Os-cal 500 + D 500-400 Mg-unit Chew (Calcium carbonate-vitamin d) .Marland Kitchen.. 1 tab by mouth two times a day 4)  Ferrous Sulfate 325 (65 Fe) Mg Tabs (Ferrous sulfate) .... Take one tablet by mouth every other day 5)  Proair Hfa 108 (90 Base) Mcg/act Aers (Albuterol sulfate) .Marland Kitchen.. 1-2 puffs every 4-6 hours as needed 6)  Prednisone 5 Mg Tabs (Prednisone) .... 5 mg every other day 7)  Advil 200 Mg Tabs (Ibuprofen) .... As  needed 8)  Flonase 50 Mcg/act Susp (Fluticasone propionate) .... 2 sprays each nostril daily as needed for sinus pain and swelling and drainage 9)  Ibuprofen 800 Mg Tabs (Ibuprofen) .Marland Kitchen.. 1 tab by mouth every 8 hours as needed for pain and swelling 10)  Spiriva Handihaler 18 Mcg Caps (Tiotropium bromide monohydrate) .... Two puffs in handihaler daily 11)  Tramadol Hcl 50 Mg Tabs (Tramadol hcl) .Marland Kitchen.. 1 tab by mouth q 6 hours as needed pain 12)  Omeprazole 40 Mg Cpdr (Omeprazole) .Marland Kitchen.. 1 tab by mouth daily for stomach 13)  Bactrim Ds 800-160 Mg Tabs (Sulfamethoxazole-trimethoprim) .Marland Kitchen.. 1 by mouth bid  Other Orders: Urinalysis-[facility] (00000)  Patient Instructions: 1)  You have a urinary tract infection.  Your prescription for bactrim ds has been sent to the [facility] pharmacy. Please take all of your antibiotic.  If you develop fever, chills, or feel like you are getting worse please call back.   Prescriptions: BACTRIM DS 800-160 MG TABS (SULFAMETHOXAZOLE-TRIMETHOPRIM) 1 by mouth bid  #14 x 0   Entered and Authorized by:   Denny Levy MD   Signed by:   Denny Levy MD on 05/13/2010   Method used:   Electronically to        [facility] [phone]* (retail)       [address]       [place], [place]  [address]       Ph: [phone]       Fax: [phone]   RxID:   [id number]   Laboratory Results   Urine Tests  Date/Time Received: May 13, 2010 3:18 PM  Date/Time Reported: May 13, 2010 3:49 PM   Routine Urinalysis   Color: yellow Appearance: Clear Glucose: negative   (Normal Range: Negative) Bilirubin: negative   (Normal Range: Negative) Ketone: negative   (Normal Range: Negative) Spec. Gravity: 1.015   (Normal Range: 1.003-1.035) Blood: small   (Normal Range: Negative) pH: 7.0   (Normal Range: 5.0-8.0) Protein: negative   (Normal Range: Negative) Urobilinogen: 0.2   (Normal Range: 0-1) Nitrite: negative   (Normal Range: Negative) Leukocyte Esterace: small   (Normal Range:  Negative)  Urine Microscopic WBC/HPF: 10-20 RBC/HPF: 0-4 Bacteria/HPF: 3+ Epithelial/HPF: 1-5 with few clue cells    Comments: ...............test performed by......Marland KitchenBonnie A. [place], MLS (ASCP)cm

## 2010-12-13 NOTE — Assessment & Plan Note (Signed)
Summary: hives & HFU,df   Vital Signs:  Patient profile:   50 year old female Height:      65.5 inches Weight:      171.4 pounds BMI:     28.19 Temp:     98.1 degrees F oral Pulse rate:   94 / minute BP sitting:   129 / 77  (left arm) Cuff size:   regular  Vitals Entered By: Gladstone Pih (Mar 14, 2010 10:59 AM) CC: Hosp F/U, for chest pain, htn, hives Is Patient Diabetic? No Pain Assessment Patient in pain? no        Primary Care Provider:  Asher Muir MD  CC:  Hosp F/U, for chest pain, htn, and hives.  History of Present Illness: 1.  hospital follow up for chest pain--hospitalized for chest pain, which responded to GI cocktail and nitro.  she states the GI cocktail actually helped more.  cardiac enzymes were negative and EKGs did not show signs of ischemia.  did not get a prescription for a PPI on discharge.  has had a few episodes of pain since that time.  not currently having pain.    2.  blood pressure--prescribed metoprolol.  did not take it b/c she thought it may have caused hives. bp great today at 129/77 on only hctz.    3.  hives--broke out saturday evening (4/30) after coming home from the hospital.  no trouble breathing.  benedryl helped and it resolved sunday evening.  she wondered if it was from the new meds prescribed during her hospital stay (tramadol and metoprolol).    Habits & Providers  Alcohol-Tobacco-Diet     Tobacco Status: current     Tobacco Counseling: to quit use of tobacco products     Year Quit: 2005  Current Medications (verified): 1)  Symbicort 160-4.5 Mcg/act  Aero (Budesonide-Formoterol Fumarate) .... 2 Puffs Each Am and 2 Each Pm 2)  Hydrochlorothiazide 25 Mg Tabs (Hydrochlorothiazide) .... Take 1 Tablet By Mouth Once A Day 3)  Os-Cal 500 + D 500-400 Mg-Unit  Chew (Calcium Carbonate-Vitamin D) .Marland Kitchen.. 1 Tab By Mouth Two Times A Day 4)  Ferrous Sulfate 325 (65 Fe) Mg  Tabs (Ferrous Sulfate) .... Take One Tablet By Mouth Every Other  Day 5)  Proair Hfa 108 (90 Base) Mcg/act  Aers (Albuterol Sulfate) .Marland Kitchen.. 1-2 Puffs Every 4-6 Hours As Needed 6)  Prednisone 5 Mg Tabs (Prednisone) .... 5 Mg Every Other Day 7)  Advil 200 Mg Tabs (Ibuprofen) .... As Needed 8)  Flonase 50 Mcg/act Susp (Fluticasone Propionate) .... 2 Sprays Each Nostril Daily As Needed For Sinus Pain and Swelling and Drainage 9)  Ibuprofen 800 Mg Tabs (Ibuprofen) .Marland Kitchen.. 1 Tab By Mouth Every 8 Hours As Needed For Pain and Swelling 10)  Spiriva Handihaler 18 Mcg  Caps (Tiotropium Bromide Monohydrate) .... Two Puffs in Handihaler Daily 11)  Tramadol Hcl 50 Mg Tabs (Tramadol Hcl) .Marland Kitchen.. 1 Tab By Mouth Q 6 Hours As Needed Pain  Allergies: No Known Drug Allergies  Social History: Smoking Status:  current  Review of Systems General:  Denies fever and loss of appetite. CV:  Complains of chest pain or discomfort; denies difficulty breathing at night, shortness of breath with exertion, and swelling of feet. Derm:  Denies dryness.  Physical Exam  General:  Well-developed,well-nourished,in no acute distress; alert,appropriate and cooperative throughout examination Lungs:  lungs clear to auscultation.  no increased work of breathing.  slightly prolonged expiration Heart:  Normal rate and regular  rhythm. S1 and S2 normal without gallop, murmur, click, rub or other extra sounds. Skin:  Intact without suspicious lesions or rashes Additional Exam:  vital signs reviewed    Impression & Recommendations:  Problem # 1:  CHEST PAIN (ICD-786.50) Assessment Improved  trial of PPI.  if not significantly improved, send to cardiology for stress test  Orders: [facility]- Est  Level 4 ([address]86)  Problem # 2:  HYPERTENSION, BENIGN SYSTEMIC (ICD-401.1) Assessment: Unchanged  at goal today on only hctz.  daughter (who is a Engineer, civil (consulting)) is to check her bp periodically.  pt to call if high.  for now hold off on the metop Her updated medication list for this problem includes:     Hydrochlorothiazide 25 Mg Tabs (Hydrochlorothiazide) .Marland Kitchen... Take 1 tablet by mouth once a day  Orders: [facility]- Est  Level 4 (57846)  Problem # 3:  URTICARIA (ICD-708.9) Assessment: New  resolved at this time.  not sure what the precipitant was.  she will call us if it happens again.    Orders: [facility]- Est  Level 4 ([address]295)  Problem # 4:  Preventive Health Care (ICD-V70.0) gave mammogram sheet.  past due for pap.  she plans to go to Dr Tamela Oddi at [facility] for pap.    Complete Medication List: 1)  Symbicort 160-4.5 Mcg/act Aero (Budesonide-formoterol fumarate) .... 2 puffs each am and 2 each pm 2)  Hydrochlorothiazide 25 Mg Tabs (Hydrochlorothiazide) .... Take 1 tablet by mouth once a day 3)  Os-cal 500 + D 500-400 Mg-unit Chew (Calcium carbonate-vitamin d) .Marland Kitchen.. 1 tab by mouth two times a day 4)  Ferrous Sulfate 325 (65 Fe) Mg Tabs (Ferrous sulfate) .... Take one tablet by mouth every other day 5)  Proair Hfa 108 (90 Base) Mcg/act Aers (Albuterol sulfate) .Marland Kitchen.. 1-2 puffs every 4-6 hours as needed 6)  Prednisone 5 Mg Tabs (Prednisone) .... 5 mg every other day 7)  Advil 200 Mg Tabs (Ibuprofen) .... As needed 8)  Flonase 50 Mcg/act Susp (Fluticasone propionate) .... 2 sprays each nostril daily as needed for sinus pain and swelling and drainage 9)  Ibuprofen 800 Mg Tabs (Ibuprofen) .Marland Kitchen.. 1 tab by mouth every 8 hours as needed for pain and swelling 10)  Spiriva Handihaler 18 Mcg Caps (Tiotropium bromide monohydrate) .... Two puffs in handihaler daily 11)  Tramadol Hcl 50 Mg Tabs (Tramadol hcl) .Marland Kitchen.. 1 tab by mouth q 6 hours as needed pain 12)  Omeprazole 40 Mg Cpdr (Omeprazole) .Marland Kitchen.. 1 tab by mouth daily for stomach 13)  Fluconazole 150 Mg Tabs (Fluconazole) .Marland Kitchen.. 1 tab by mouth x 1  Other Orders: Mammogram (Screening) (Mammo)  Patient Instructions: 1)  It was nice to see you today. 2)  Your blood pressure is great today.  Have your daughter check it periodically.  Call me if it is >140/90.    3)  For your pain, start taking omeprazole every day. 4)  Get your mammogram. 5)  Schedule your pap smear with Dr. Tamela Oddi. 6)  Please schedule a follow-up appointment in 1 month to make sure your chest pain is getting better.  Prescriptions: FLUCONAZOLE 150 MG TABS (FLUCONAZOLE) 1 tab by mouth X 1  #1 x 0   Entered and Authorized by:   Asher Muir MD   Signed by:   Asher Muir MD on 03/14/2010   Method used:   Electronically to        [facility] [address] (retail)       [address]       [place], [place]  [id number]       Ph: [phone]       Fax: [phone]   RxID:   [id number] OMEPRAZOLE 40 MG CPDR (OMEPRAZOLE) 1 tab by mouth daily for stomach  #30 x 3   Entered and Authorized by:   Asher Muir MD   Signed by:   Asher Muir MD on 03/14/2010   Method used:   Electronically to        [facility] (retail)       [address]       [place], [place]  [id number]       Ph: [phone]       Fax: [phone]   RxID:   [id number]    Prevention & Chronic Care Immunizations   Influenza vaccine: Fluvax 3+  (10/25/2007)   Influenza vaccine due: 10/24/2008    Tetanus booster: 06/13/2004: Done.   Tetanus booster due: 06/13/2014    Pneumococcal vaccine: Not documented  Colorectal Screening   Hemoccult: Not documented    Colonoscopy: Location:  [facility].    (11/30/2008)   Colonoscopy due: 12/2018  Other Screening   Pap smear: ATYPICAL SQUAMOUS CELLS OF UNDETERMINED SIGNIFICANCE  (02/16/2009)   Pap smear due: 10/20/2007    Mammogram: normal  (08/16/2007)   Mammogram action/deferral: Ordered  (03/14/2010)   Mammogram due: 08/15/2008   Smoking status: current  (03/14/2010)  Lipids   Total Cholesterol: 199  (11/10/2008)   LDL: 107  (03/14/2010)   LDL Direct: Not documented   HDL: 63  (03/14/2010)   Triglycerides: 63  (03/14/2010)  Hypertension   Last Blood Pressure: 129 /  77  (03/14/2010)   Serum creatinine: 0.67  (03/14/2010)   Serum potassium 3.2  (03/14/2010)    Hypertension flowsheet reviewed?: Yes   Progress toward BP goal: At goal  Self-Management Support :    Hypertension self-management support: Not documented    Hypertension self-management support not done because: Good outcomes  (03/14/2010)   Nursing Instructions: Schedule screening mammogram (see order)

## 2010-12-13 NOTE — Progress Notes (Signed)
Summary: samples of spiriva  Phone Note Call from Patient Call back at [phone]   Caller: Patient Call For: wert Summary of Call: samples of symbicort and spiriva leave mgs because pt is at work Initial call taken by: Rickard Patience,  August 11, 2010 1:57 PM  Follow-up for Phone Call        no samples avail of Symbicort.  Left 2 boxes of Samples of Spiriva at the front desk.  LMOM informing pt of this.  Arman Filter LPN  August 11, 2010 2:35 PM   Spiriva lot # [id number]   exp Feb 2012

## 2010-12-13 NOTE — Progress Notes (Signed)
Summary: triage   Phone Note Call from Patient Call back at Home Phone [phone]   Caller: Patient Summary of Call: Pt thinks she has a bladder infection and has made a work in appt for tomorrow, but can we leave a message on her voice mail at home suggesting something she can do in the meantime.   Initial call taken by: Clydell Hakim,  May 12, 2010 1:46 PM  Follow-up for Phone Call        LM to drink plenty of water & take otc pain reliever if in pain. if this is bad, see md at [facility] today. if she can wait, we will see her tomorrow Follow-up by: Golden Circle RN,  May 12, 2010 1:48 PM  Additional Follow-up for Phone Call Additional follow up Details #1::        agreed.  Thanks! Additional Follow-up by: Delbert Harness MD,  May 12, 2010 4:53 PM

## 2010-12-13 NOTE — Progress Notes (Signed)
Summary: triage   Phone Note Call from Patient Call back at Home Phone [phone]   Caller: Patient Summary of Call: Pt lost pill for yeast infection and wonders if this can be called back in or does she have to come in?   Initial call taken by: Clydell Hakim,  June 27, 2010 9:36 AM  Follow-up for Phone Call        LM. this was called in. Follow-up by: Golden Circle RN,  June 27, 2010 9:52 AM

## 2010-12-13 NOTE — Progress Notes (Signed)
Summary: samples  Phone Note Call from Patient Call back at Home Phone [phone]   Caller: Patient Call For: Samual Beals Reason for Call: Talk to Nurse Summary of Call: pt wants to know if she can get Symbicort inhakler and Spiriva Samples?  She's at work...leave a message if she doesn't answer. Initial call taken by: Eugene Gavia,  Apr 12, 2010 9:16 AM  Follow-up for Phone Call        1 box of Spriva and 2 boxes of Symbicort left at front office. LMOM advising pt of same. Zackery Barefoot CMA  Apr 12, 2010 9:25 AM     Prescriptions: SPIRIVA HANDIHALER 18 MCG  CAPS (TIOTROPIUM BROMIDE MONOHYDRATE) Two puffs in handihaler daily  #1 x 0   Entered by:   Zackery Barefoot CMA   Authorized by:   Nyoka Cowden MD   Signed by:   Zackery Barefoot CMA on 04/12/2010   Method used:   Samples Given   RxID:   [id number] SYMBICORT 160-4.5 MCG/ACT  AERO (BUDESONIDE-FORMOTEROL FUMARATE) 2 puffs each am and 2 each pm  #2 x 0   Entered by:   Zackery Barefoot CMA   Authorized by:   Nyoka Cowden MD   Signed by:   Zackery Barefoot CMA on 04/12/2010   Method used:   Samples Given   RxID:   [id number]

## 2010-12-13 NOTE — Progress Notes (Signed)
   Phone Note Outgoing Call   Summary of Call: would you mind calling Dr. Marcia Brash office to see if ms Kail showed for her appt?  if yes, can they send [place] records.  thanks Initial call taken by: Asher Muir MD,  January 13, 2010 3:47 PM  Follow-up for Phone Call        [place] and office answering service picked up...office closes at 12 on Fridays Follow-up by: Gladstone Pih,  January 14, 2010 12:14 PM

## 2010-12-13 NOTE — Letter (Signed)
Summary: Out of Work  [facility]  [address]   [place], [place] [address]   Phone: [phone]  Fax: [phone]    June 17, 2010   Employee:  [name]    To Whom It May Concern:   For Medical reasons, please excuse the above named employee from work for the following dates:  Start:   Friday 06/17/10  End:   Return Monday 06/20/10  If you need additional information, please feel free to contact our office.         Sincerely,    Doralee Albino MD

## 2010-12-13 NOTE — Miscellaneous (Signed)
Summary: CP   Clinical Lists Changes started last night.c/o back pain. now has CP & pressure on sternum. feels like a weight on her chest. SOB.denies nausea or sweatiness. due to description of her complaints & hx, sent to ED. she is in the car with her dtr now & on her way.Golden Circle RN  March 11, 2010 2:18 PM

## 2010-12-13 NOTE — Letter (Signed)
Summary: Out of Work  [employer]  [address]   [place], [place] [address]   Phone: [phone]  Fax: [phone]    March 02, 2010   Employee:  [name]    To Whom It May Concern:   For Medical reasons, please excuse the above named employee from work for the following dates:  Start:   03/02/10  End:   03/03/10  If you need additional information, please feel free to contact our office.         Sincerely,    Sandrea Hughs, MD

## 2010-12-13 NOTE — Assessment & Plan Note (Signed)
Summary: Pulmonary/ acute  ov with acute exac ? sinusitis   Primary Provider/Referring Provider:  Asher Muir MD  CC:  3 month followup.  Pt states that for the past 3 days she has had prod cough with green sputum in the am.  She states that she feels fine during the day and has not had any diss with breathing.Marland Kitchen  History of Present Illness: 49  yowbf  quit smoking 12/05 diagnosed by transbronchial biopsy 5/92 with NCG inflammatory change consistent with sarcoid.  Since that time she's been off and on prednisone several times with symptoms of coughing dyspnea and skin involvement.  The last time she was off prednisone was in 2007 but she flared up with skin changes, anorexia and weight loss and therefore is been on prednisone ever since 2007 having tapered down now to 5 mg per day.  Overall she's been doing much better since she stopped smoking 12/05 and much better since change to Symbicort 160/4.5 2 two times a day, able to do adl's ok without limitations including some steps. No nocturnal exac, excess am sputum, nasal co's or over reflux symptoms.   June 26, 2008 returns for PFT's still prednisone 5mg  one daily plus symbicort 160, no new problems.  Apr 02, 2009 taper prednisone 5 mg one a/w a half found out she got nausea and weak for 2 weeks. no rash, no increase sob or need for rescue.  never able to go below 5 mg every other day.  October 12, 2009 ov 6 month followup.  Pt c/o wheezing x 3 wks.  She states that "clears up and then comes right back".  Also she c/o some increased SOIB since ran out of her symbicort 3 days ago.  no ocular or articular co's .  no rash. rec resume symbicort and prednsione 5mg  every other day  March 02, 2010  6 wk followup with PFT's.  Pt states that her breathing is much better.   rec add spriva  July 27, 2011cc overall better on spiriva but for past 3 days she has had prod cough with green sputum in the am.  She states that she feels fine during the day  and has not had any problem with breathing.  Pt denies any significant sore throat, dysphagia, itching, sneezing,  fever, chills, sweats, unintended wt loss, pleuritic or exertional cp, hempoptysis, change in activity tolerance  orthopnea pnd or leg swelling. not using nasal steroids as rec  Current Medications (verified): 1)  Symbicort 160-4.5 Mcg/act  Aero (Budesonide-Formoterol Fumarate) .... 2 Puffs Each Am and 2 Each Pm 2)  Hydrochlorothiazide 25 Mg Tabs (Hydrochlorothiazide) .... Take 1 Tablet By Mouth Once A Day 3)  Os-Cal 500 + D 500-400 Mg-Unit  Chew (Calcium Carbonate-Vitamin D) .Marland Kitchen.. 1 Tab By Mouth Two Times A Day 4)  Ferrous Sulfate 325 (65 Fe) Mg  Tabs (Ferrous Sulfate) .... Take One Tablet By Mouth Every Other Day 5)  Proair Hfa 108 (90 Base) Mcg/act  Aers (Albuterol Sulfate) .Marland Kitchen.. 1-2 Puffs Every 4-6 Hours As Needed 6)  Prednisone 5 Mg Tabs (Prednisone) .... 5 Mg Every Other Day 7)  Advil 200 Mg Tabs (Ibuprofen) .... As Needed 8)  Flonase 50 Mcg/act Susp (Fluticasone Propionate) .... 2 Sprays Each Nostril Daily As Needed For Sinus Pain and Swelling and Drainage 9)  Ibuprofen 800 Mg Tabs (Ibuprofen) .Marland Kitchen.. 1 Tab By Mouth Every 8 Hours As Needed For Pain and Swelling 10)  Spiriva Handihaler 18 Mcg  Caps (Tiotropium Bromide Monohydrate) .Marland KitchenMarland KitchenMarland Kitchen  Two Puffs in Handihaler Daily 11)  Tramadol Hcl 50 Mg Tabs (Tramadol Hcl) .Marland Kitchen.. 1 Tab By Mouth Q 6 Hours As Needed Pain 12)  Omeprazole 40 Mg Cpdr (Omeprazole) .Marland Kitchen.. 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 Marga Melnick     -Daily prednisone since 2007 with ? adrenal insufficiency iatrogenic ? 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 (ICD-280.9)    Vital Signs:  Patient profile:   50 year old female Weight:      177.50 pounds O2 Sat:      94 % on Room air Temp:     98.2 degrees F oral Pulse rate:   78 / minute BP sitting:   114 / 80  (left arm)  Vitals Entered By: Vernie Murders (June 08, 2010 12:03 PM)  O2 Flow:  Room air  Physical Exam  Additional Exam:  wt  166 October 12, 2009 > 175 January 18, 2010 > 177 June 08, 2010  amb bf nad HEENT mild turbinate edema.  Oropharynx no thrush or excess pnd or cobblestoning.  No JVD or cervical adenopathy. Mild accessory muscle hypertrophy. Trachea midline, nl thryroid. Chest was hyperinflated by percussion with diminished breath sounds and moderate increased exp time without wheeze. Hoover sign positive at mid inspiration. Regular rate and rhythm without murmur gallop or rub or increase P2 or edema.  Abd: no hsm, nl excursion. Ext warm without cyanosis or clubbing.     Impression & Recommendations:  Problem # 1:  SARCOIDOSIS (ICD-135)  The goal with a chronic steroid dependent illness is always arriving at the lowest effective dose that controls the disease/symptoms and not accepting a set "formula" which is based on statistics that don't take into accound individual variability or the natural hx of the dz in every individual patient, which may well vary over time.   Try 10 as ceiling as 5 mg every other day as floor  Problem # 2:  COPD (ICD-496) better on spiriva, mild uri ? underlying rhinitis / sinusitis  non-adherent to nasal steroids  See instructions for specific recommendations   Medications Added to Medication List This Visit: 1)  Augmentin 875-125 Mg Tabs (Amoxicillin-pot clavulanate) .... One twice daily x 7 days with bfast and supper and a glass of water 2)  Diflucan 100 Mg Tabs (Fluconazole) .... One at end of augmentin course  Other Orders: Est. Patient Level III (03474)  Patient Instructions: 1)  Augmentin twice daily x 7 days and diflucan at end of treatment 2)  Return to office in 3 months, sooner if needed  Prescriptions: PREDNISONE 5 MG TABS (PREDNISONE) 5 mg every other day  #100 x 0   Entered and Authorized by:   Nyoka Cowden MD  Signed by:   Nyoka Cowden MD on 06/08/2010   Method used:   Electronically to        [facility] [phone]* (retail)       [address]       [place], [place]  [address]       Ph: [phone]       Fax: [phone]   RxID:   [id number] DIFLUCAN 100 MG TABS (FLUCONAZOLE) one at end of augmentin course  #1 x 0   Entered and Authorized by:   Nyoka Cowden MD   Signed by:   Nyoka Cowden MD on 06/08/2010   Method used:   Electronically to        [facility] [phone]* (retail)       [address]       [place], [place]  [address]       Ph: [phone]       Fax: [phone]   RxID:   [id number] AUGMENTIN 875-125 MG  TABS (AMOXICILLIN-POT CLAVULANATE) one twice daily x 7 days with bfast and supper and a glass of water  #14 x 0   Entered and Authorized by:   Nyoka Cowden MD   Signed by:   Nyoka Cowden MD on 06/08/2010   Method used:   Electronically to        [facility] [phone]* (retail)       [address]       [place], [place]  [address]       Ph: [phone]       Fax: [phone]   RxID:   [id number]

## 2010-12-13 NOTE — Letter (Signed)
Summary: Generic Letter  [facility]  [address]   [place], [place] [address]   Phone: [phone]  Fax: [phone]    01/18/2010  [name] [address] [place], [place]  [address]  Dear [name],   I just wanted to remind you to follow up with Dr. Tamela Oddi about your abnormal pap smear if you have not already done so.  Please call me if you have any questions or concerns.           Sincerely,   Erin Muir MD  Appended Document: Generic Letter mailed certified.  Appended Document: Generic Letter Letter Returned unable to forward.

## 2010-12-13 NOTE — Miscellaneous (Signed)
Summary: Hosp Adm + R3 Addendum: CP rule out   [name]   PCP: Lafonda Mosses  CC: CP  HPI: 50 YOF w/ PMHx/o sarcoidosis, HTN, COPD w/ 1 day hx/o CP. Pt states CP started yesterday evening after dinner. Pt states that CP persisted into early am where CP continued while pt was at work. Pt describes CP as intermittent in nature; occurring predominantly with exertion, not relieved by rest. Also describes chest pressure, like "elephant on my chest". CP is in central chest per pt with radiation to R arm and to back. Pt denies CP w/ deep inspiration. Pt denies nausea, diaphoresis, radiation to jaw/neck, dyspnea ass'd w/ CP. Pt denies cardiac history or history of similar symptoms ass'd w/ eating or COPD flares. Pt initially called [facility] about symptoms with referral to [facility] for further evaluation. In ED, CP relieved by NTG, no ischemic changes noted on EKG, negative CEs x1.   Allergies:  No Known Drug Allergies   Past Medical History: 1.   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   SARCOIDOSIS  with skin involvement (Dr. [name]) a.   -Positive transbronchial biopsy 02/19/91 by Dr. Marga Melnick b.   -Daily prednisone since 2007 with ? adrenal insufficiency iatrogenic 3.   ? Hx/o DVT 4.   Vertigo/Dizziness 5.   Hx/o Uterine fibroids 6.   Hx/o endometrial polyps 7.   Allergic Rhinitis 8.   HTN 9.   COPD  10.   Low back pain 11.   Iron deficiency anemia   Past Surgical History: Tubal ligation - 02/11/1985  Family History: crohn`s in daughter, M-lupus, htn, asthma, no Ca, DM, CAD  Social History: lives with twin children and grandson.  single;  >20 pack year history, quit in 2005; no EtOH  Risk Factors: Smoking Status: quit > 6 months (09/13/2009)  Medications: 1.   Symbicort 160-4.5 Mcg/act Aero (Budesonide-formoterol fumarate) .... 2 puffs each am and 2 each pm 2.   Hydrochlorothiazide 25 Mg Tabs (Hydrochlorothiazide) .... Take 1 tablet by mouth once a day 3.   Os-cal 500 + D  500-400 Mg-unit Chew (Calcium carbonate-vitamin d) .Marland Kitchen.. 1 tab by mouth two times a day 4.   Ferrous Sulfate 325 (65 Fe) Mg Tabs (Ferrous sulfate) .... Take one tablet by mouth every other day 5.   Proair Hfa 108 (90 Base) Mcg/act Aers (Albuterol sulfate) .Marland Kitchen.. 1-2 puffs every 4-6 hours as needed 6.   Prednisone 5 Mg Tabs (Prednisone) .... 5 mg every other day 7.   Advil 200 Mg Tabs (Ibuprofen) .... As needed 8.   Flonase 50 Mcg/act Susp (Fluticasone propionate) .... 2 sprays each nostril daily as needed for sinus pain and swelling and drainage 9.   Ibuprofen 800 Mg Tabs (Ibuprofen) .Marland Kitchen.. 1 tab by mouth every 8 hours as needed for pain and swelling 10.   Spiriva Handihaler 18 Mcg Caps (Tiotropium bromide monohydrate) .... Two puffs in handihaler daily  PE:  T-98.1  HR- 82   RR-18   BP- 147/86   SaO2 98% RA 1.   GEN: In bed, NAD 2.   HEENT: NCAT, EOMI 3.   CV: RRR, no rubs, gallops, murmurs auscultated 4.   PULM: Diffuse expiratory coarse breath sounds, no increased WOB noted 5.   ABD: S/NT/ND +bowel sounds 6.   EXT: 2+ peripheral pulses 7.   NEURO: CN II-XII grossly intact, grossly normal exam  LABS AND STUDIES:  Sodium (NA)  140               135-145          mEq/L  Potassium (K)                            3.2        l      3.5-5.1          mEq/L  Chloride                                 103               96-112           mEq/L  CO2                                      32                19-32            mEq/L  Glucose                                  70                70-99            mg/dL  BUN                                      6                 6-23             mg/dL  Creatinine                               0.67      Hemoglobin (HGB)                         12.3              12.0-15.0        g/dL  Hematocrit (HCT)                         36.2              36.0-46.0        %  MCV                                      81.7              78.0-100.0       fL   MCHC                                     33.8              30.0-36.0  g/dL  RDW                                      16.5       h      11.5-15.5        %  Platelet Count (PLT)                     342      1.   CE neg x 1 set (POC CE also negative)  2.   CXR: Stable chronic lung disease.  No superimposed acute findings are identified.  3.   EKG: NSR; normal axis and intervals; no ST elevation/depression or T wave inversion (03/11/2010 17:28)  A/P: 50 YOF w/ Typical CP 1.   CP:  Pt presenting w/ symptoms of typical CP (substernal pain, provoked by exertion, relieved by NTG). TIMI score 1. Plan to rule out MI in pt w/ cycling of CEs as well as repeat EKG in AM as well as repeat CXR in am. Also plan to risk stratify pt w/ TSH and FLP. Will also start pt on ASA and metoprolol for cardiac protection. PRN NTG for CP. Pt will likely need outpatient stress testing after discharge.  2.   Sarcoidosis/COPD: Plan to continue spiriva, symbicort, albuterol. Coarse breath sounds on exam-baseline, currently no concern for COPD exacerbation. Cont home prednisone.  3.   HTN: Continue HCTZ 4.   Iron Deficiency Anemia: Baseline Hgb 10.5-11.9; Hgb 12.3 on admission. Plan to continue ferrous sulfate.  5.   Allergic Rhinitis: Cont flonase 6.   FEN/GI: HLIV 7.   PPX: Heparin subq TID 8.   Dispo: Pending further evaluation.   Problems: Assessed HYPERTENSION, BENIGN SYSTEMIC as comment only - Mildly hypertensive in ED. Start beta blocker and follow while ruling out.  Her updated medication list for this problem includes:    Hydrochlorothiazide 25 Mg Tabs (Hydrochlorothiazide) .Marland Kitchen... Take 1 tablet by mouth once a day  Assessed SARCOIDOSIS as comment only - cotinue home dose of prednisone. CXR consistent with chronic lung disease.  Observations: Added new observation of GEN APPEAR: 98.1  147/86  82  98% RA (03/11/2010 17:28) Added new observation of PEADULT: Erin Soman  MD ~Additional Exam`PE comments  ~General`Gen appear (03/11/2010 17:28) Added new observation of PE COMMENTS: Sodium (NA)                              140               135-145          mEq/L  Potassium (K)                            3.2        l      3.5-5.1          mEq/L  Chloride                                 103               96-112           mEq/L  CO2  32                19-32            mEq/L  Glucose                                  70                70-99            mg/dL  BUN                                      6                 6-23             mg/dL  Creatinine                               0.67      Hemoglobin (HGB)                         12.3              12.0-15.0        g/dL  Hematocrit (HCT)                         36.2              36.0-46.0        %  MCV                                      81.7              78.0-100.0       fL  MCHC                                     33.8              30.0-36.0        g/dL  RDW                                      16.5       h      11.5-15.5        %  Platelet Count (PLT)                     342      CE neg x 1 set (POC CE also negative)  CXR: Stable chronic lung disease.  No superimposed acute findings are identified.  EKG: NSR; normal axis and intervals; no ST elevation/depression or T wave inversion (03/11/2010 17:28) Added new observation of HPI: R3 addendum -- agree with Dr. Mauro Kaufmann H&P -- see his note for further details  Briefly -- this is a 50 yo female with steroid-dependent sacroidosis and a history of HTN who presents to the ED with chest pain.  FPTS is called  to admit the patient for rule out of MI.  (03/11/2010 17:28) Added new observation of PRIMARY MD: Asher Muir MD (03/11/2010 17:28)      Primary Care Provider:  Asher Muir MD   History of Present Illness: R3 addendum -- agree with Dr. Mauro Kaufmann H&P -- see his note for further details  Briefly -- this is a 50 yo female with steroid-dependent  sacroidosis and a history of HTN who presents to the ED with chest pain.  FPTS is called to admit the patient for rule out of MI.    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 Marga Melnick     -Daily prednisone since 2007 with ? adrenal insufficiency iatrogenic ? 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 (ICD-280.9)    Past Surgical History: Last updated: 06/10/2007 Tubal ligation - 02/11/1985  Family History: Last updated: 11/25/2007 crohn`s in daughter, M-lupus, htn, asthma, no Ca, DM, CAD  Social History: Last updated: 12/18/2007 lives with twin children and grandson.  single;  >20 pack year history, quit in 2005; no EtOH   Physical Exam  General:  98.1  147/86  82  98% RA Additional Exam:  Sodium (NA)                              140               135-145          mEq/L  Potassium (K)                            3.2        l      3.5-5.1          mEq/L  Chloride                                 103               96-112           mEq/L  CO2                                      32                19-32            mEq/L  Glucose                                  70  70-99            mg/dL  BUN                                      6                 6-23             mg/dL   Creatinine                               0.67      Hemoglobin (HGB)                         12.3              12.0-15.0        g/dL  Hematocrit (HCT)                         36.2              36.0-46.0        %  MCV                                      81.7              78.0-100.0       fL  MCHC                                     33.8              30.0-36.0        g/dL  RDW                                      16.5       h      11.5-15.5        %  Platelet Count (PLT)                     342      CE neg x 1 set (POC CE also negative)  CXR: Stable chronic lung disease.  No superimposed acute findings are identified.  EKG: NSR; normal axis and intervals; no ST elevation/depression or T wave inversion   Impression & Recommendations:  Problem # 1:  chest pain received ASA and NTG in ED. will admit to observation on tele and cycle cardiac enzymes. EKG unremarkable and will repeat in the morning. Check TSH; low risk for DVT so will not order D-dimer. low-dose beta blocker at least while ruling out. FLP in am for further risk stratification. If rules out, may consider stress testing as an outpatient.   Problem # 2:  HYPERTENSION, BENIGN SYSTEMIC (ICD-401.1) Mildly hypertensive in ED. Start beta blocker and follow while ruling out.  Her updated medication list for this problem includes:    Hydrochlorothiazide 25 Mg Tabs (Hydrochlorothiazide) .Marland Kitchen... Take 1 tablet by mouth once a day  Problem # 3:  SARCOIDOSIS (ICD-135) cotinue home dose  of prednisone. CXR consistent with chronic lung disease.   Problem # 4:  FEN/GI HLIV, full diet.  Problem # 5:  Proph: Heparin three times a day for DVT proph.  Problem # 6:  Dispo:  23 hr obs on tele. Hopefully rules out; further w/u if rules in for MI.   Complete Medication List: 1)  Symbicort 160-4.5 Mcg/act Aero (Budesonide-formoterol fumarate) .... 2 puffs each am and 2 each pm 2)  Hydrochlorothiazide 25 Mg Tabs (Hydrochlorothiazide) .... Take 1  tablet by mouth once a day 3)  Os-cal 500 + D 500-400 Mg-unit Chew (Calcium carbonate-vitamin d) .Marland Kitchen.. 1 tab by mouth two times a day 4)  Ferrous Sulfate 325 (65 Fe) Mg Tabs (Ferrous sulfate) .... Take one tablet by mouth every other day 5)  Proair Hfa 108 (90 Base) Mcg/act Aers (Albuterol sulfate) .Marland Kitchen.. 1-2 puffs every 4-6 hours as needed 6)  Prednisone 5 Mg Tabs (Prednisone) .... 5 mg every other day 7)  Advil 200 Mg Tabs (Ibuprofen) .... As needed 8)  Flonase 50 Mcg/act Susp (Fluticasone propionate) .... 2 sprays each nostril daily as needed for sinus pain and swelling and drainage 9)  Ibuprofen 800 Mg Tabs (Ibuprofen) .Marland Kitchen.. 1 tab by mouth every 8 hours as needed for pain and swelling 10)  Spiriva Handihaler 18 Mcg Caps (Tiotropium bromide monohydrate) .... Two puffs in handihaler daily              X---------------------------------- Doree Albee MD [phone]    Appended Document: Hosp Adm + R3 Addendum: CP rule out R3: Erin Soman MD

## 2010-12-13 NOTE — Miscellaneous (Signed)
Summary: Consent for Medical Procedure  Consent for Medical Procedure   Imported By: Knox Royalty 12/03/2009 13:20:57  _____________________________________________________________________  External Attachment:    Type:   Image     Comment:   External Document

## 2010-12-14 ENCOUNTER — Telehealth (INDEPENDENT_AMBULATORY_CARE_PROVIDER_SITE_OTHER): Payer: Self-pay | Admitting: *Deleted

## 2010-12-15 NOTE — Letter (Signed)
Summary: [facility]  [address]   [place], [place] [address]   Phone: [phone]  Fax: [phone]    10/27/2010  AREYA LEMMERMAN [address] [place], [place]  [address]  Dear Ms. Hollingshed,  It was nicing seeing you today in the pulmonary clinic.  Ok to return to work October 26, 2010      Sincerely,   Sandrea Hughs MD

## 2010-12-15 NOTE — Progress Notes (Signed)
Summary: rx/ sinus  Phone Note Call from Patient Call back at Home Phone [phone]   Caller: Patient Call For: wert Summary of Call: pt wants new rx for flonase/ sinus congestion. [facility] [address].  Initial call taken by: Tivis Ringer, CNA,  December 09, 2010 10:38 AM  Follow-up for Phone Call        Pt informed that rx for Flonase was sent to Pharmacy. Abigail Miyamoto RN  December 09, 2010 12:01 PM     Prescriptions: Aleda Grana 50 MCG/ACT SUSP (FLUTICASONE PROPIONATE) 2 sprays each nostril daily as needed for sinus pain and swelling and drainage  #1 x 6   Entered by:   Abigail Miyamoto RN   Authorized by:   Nyoka Cowden MD   Signed by:   Abigail Miyamoto RN on 12/09/2010   Method used:   Electronically to        [facility] [phone]* (retail)       [address]       [place], [place]  [address]       Ph: [phone]       Fax: [phone]   RxID:   [id number]

## 2010-12-15 NOTE — Assessment & Plan Note (Signed)
Summary: ?bacterial infection/briscoe/bmc   Vital Signs:  Patient profile:   50 year old female Height:      65.5 inches Weight:      170.4 pounds BMI:     28.03 Temp:     97.9 degrees F oral Pulse rate:   88 / minute BP sitting:   133 / 77  (left arm) Cuff size:   large  Vitals Entered By: Jimmy Footman, CMA (November 08, 2010 12:02 PM) CC: vaginal itching, Vaginal discharge Is Patient Diabetic? No Pain Assessment Patient in pain? no        Primary Care Provider:  Asher Muir MD  CC:  vaginal itching and Vaginal discharge.  History of Present Illness:  Vaginal discharge      This is a 50 year old woman who presents with Vaginal discharge.  The patient complains of itching and vaginal burning, but denies burning on urination, fever, and pelvic pain.  The discharge is described as white.  The patient denies the following symptoms: genital sores, unusual vaginal bleeding, and painful intercourse.  Prior treatment has included prescription antifungals.    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  (ICD-280.9)  Current Medications (verified): 1)  Symbicort 160-4.5 Mcg/act  Aero (Budesonide-Formoterol Fumarate) .... 2 Puffs Each Am and 2 Each Pm 2)  Hydrochlorothiazide 25 Mg Tabs  (Hydrochlorothiazide) .... Take 1 Tablet By Mouth Once A Day 3)  Os-Cal 500 + D 500-400 Mg-Unit  Chew (Calcium Carbonate-Vitamin D) .Marland Kitchen.. 1 Tab By Mouth Two Times A Day 4)  Ferrous Sulfate 325 (65 Fe) Mg  Tabs (Ferrous Sulfate) .... Take One Tablet By Mouth Every Other Day 5)  Proair Hfa 108 (90 Base) Mcg/act  Aers (Albuterol Sulfate) .Marland Kitchen.. 1-2 Puffs Every 4-6 Hours As Needed 6)  Prednisone 5 Mg Tabs (Prednisone) .... 5 Mg  One Half Every Other Day 7)  Advil 200 Mg Tabs (Ibuprofen) .... As Needed 8)  Flonase 50 Mcg/act Susp (Fluticasone Propionate) .... 2 Sprays Each Nostril Daily As Needed For Sinus Pain and Swelling and Drainage 9)  Ibuprofen 800 Mg Tabs (Ibuprofen) .Marland Kitchen.. 1 Tab By Mouth Every 8 Hours As Needed For Pain and Swelling 10)  Spiriva Handihaler 18 Mcg  Caps (Tiotropium Bromide Monohydrate) .... Two Puffs in Handihaler Daily 11)  Tramadol Hcl 50 Mg Tabs (Tramadol Hcl) .Marland Kitchen.. 1 Tab By Mouth Q 6 Hours As Needed Pain 12)  Omeprazole 40 Mg Cpdr (Omeprazole) .Marland Kitchen.. 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 Marga Melnick     -Daily prednisone since 2007 with ? adrenal insufficiency iatrogenic ? 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 (ICD-280.9)    Past Surgical History: Last updated: 06/10/2007 Tubal ligation - 02/11/1985  Family History: Last updated: 11/25/2007 crohn`s in daughter, M-lupus, htn, asthma, no Ca, DM, CAD  Social History: Last updated: 12/18/2007 lives with twin children and grandson.  single;  >20 pack year history, quit in 2005; no EtOH  Risk Factors: Smoking Status: quit (05/13/2010)  Review of Systems  The patient denies anorexia, weight loss, weight gain, decreased hearing, chest pain, dyspnea on exertion, headaches, and abdominal pain.    Physical Exam  General:  alert, well-developed, and well-nourished.   Neck:  supple.   Abdomen:  soft and non-tender.   Genitalia:  normal introitus, mucosa pink and moist, no vaginal or cervical lesions, no friaility or hemorrhage, normal uterus size and position, no adnexal masses or tenderness, and vaginal discharge.     Impression & Recommendations:  Problem # 1:  VAGINAL TRICHOMONIASIS (ICD-131.01)  Treatment for pt.  Advised partner rx.  GC/Chlam testing done today.  Orders: [facility]- Est Level  3 (16109)  Complete Medication List: 1)  Symbicort 160-4.5 Mcg/act Aero (Budesonide-formoterol fumarate) .... 2 puffs each am and 2 each pm 2)  Hydrochlorothiazide 25 Mg Tabs (Hydrochlorothiazide) .... Take 1 tablet by mouth once a day 3)  Os-cal 500 + D 500-400 Mg-unit Chew (Calcium carbonate-vitamin d) .Marland Kitchen.. 1 tab by mouth two times a day 4)  Ferrous Sulfate 325 (65 Fe) Mg Tabs (Ferrous sulfate) .... Take one tablet by mouth every other day 5)  Proair Hfa 108 (90 Base) Mcg/act Aers (Albuterol sulfate) .Marland Kitchen.. 1-2 puffs every 4-6 hours as needed 6)  Prednisone 5 Mg Tabs (Prednisone) .... 5 mg  one half every other day 7)  Advil 200 Mg Tabs (Ibuprofen) .... As needed 8)  Flonase 50 Mcg/act Susp (Fluticasone propionate) .... 2 sprays each nostril daily as needed for sinus pain and swelling and drainage 9)   Ibuprofen 800 Mg Tabs (Ibuprofen) .Marland Kitchen.. 1 tab by mouth every 8 hours as needed for pain and swelling 10)  Spiriva Handihaler 18 Mcg Caps (Tiotropium bromide monohydrate) .... Two puffs in handihaler daily 11)  Tramadol Hcl 50 Mg Tabs (Tramadol hcl) .Marland Kitchen.. 1 tab by mouth q 6 hours as needed pain 12)  Omeprazole 40 Mg Cpdr (Omeprazole) .Marland Kitchen.. 1 tab by mouth daily for stomach 13)  Metronidazole 500 Mg Tabs (Metronidazole) .... 2 by mouth q am, and 2 bo q pm x 1 day  Other Orders: GC/Chlamydia-[facility] (87591/87491) Wet Prep- [facility] (60454) Prescriptions: METRONIDAZOLE 500 MG TABS (METRONIDAZOLE) 2 by mouth q am, and 2 bo q pm x 1 day  #4 x 0   Entered and Authorized by:   Tinnie Gens MD   Signed by:   Tinnie Gens MD on 11/08/2010   Method used:   Electronically to        [facility] [phone]* (retail)       [address]  [place], [place]  [address]       Ph: [phone]       Fax: [phone]   RxID:   [id number]    Orders Added: 1)  GC/Chlamydia-[facility] [87591/87491] 2)  Mellody Drown Prep- [facility] [34742] 3)  [facility]- Est Level  3 [59563]  Appended Document: Mellody Drown Prep Results     Lab Visit  Laboratory Results  Date/Time Received: November 08, 2010 12:30 PM  Date/Time Reported: November 08, 2010 1:00 PM   [employer] Source: Vaginal WBC/hpf: 5-10 Bacteria/hpf: 2+  Rods Clue cells/hpf: rare  Negative whiff Yeast/hpf: none Trichomonas/hpf: many Comments: ...............test performed by......Marland KitchenBonnie A. [place], MLS (ASCP)cm/ Milinda Antis MD   Orders Today:

## 2010-12-15 NOTE — Progress Notes (Signed)
Summary: Ok for hysterectomy  Phone Note From Other Clinic   Caller: frances w/ dr Ilene Qua moore Call For: wert Summary of Call: dr Tamela Oddi requests to speak to dr wert today if possible (no need to pull him out of a room) re: pt. dr's # [phone] Initial call taken by: Tivis Ringer, CNA,  November 18, 2010 10:10 AM  Follow-up for Phone Call        walking up to 2 miles per day so ok for hysterectomy Follow-up by: Nyoka Cowden MD,  November 18, 2010 1:10 PM

## 2010-12-15 NOTE — Assessment & Plan Note (Signed)
Summary: Pulmonary/ f/u sarcoid, try pred 2.5 mg qod   Primary Provider/Referring Provider:  Asher Muir MD  CC:  Cough- much better.  History of Present Illness: 49  yowbf  quit smoking 12/05 diagnosed by transbronchial biopsy 5/92 with NCG inflammatory change consistent with sarcoid.  Since that time she's been off and on prednisone several times with symptoms of coughing dyspnea and skin involvement.  The last time she was off prednisone was in 2007 but she flared up with skin changes, anorexia and weight loss and therefore is been on prednisone ever since 2007 having tapered down now to 5 mg per day.  Overall she's been doing much better since she stopped smoking 12/05 and much better since change to Symbicort 160/4.5 2 two times a day, able to do adl's ok without limitations including some steps. No nocturnal exac, excess am sputum, nasal co's or over reflux symptoms.   June 26, 2008 returns for PFT's still prednisone 5mg  one daily plus symbicort 160, no new problems.  Apr 02, 2009 taper prednisone 5 mg one a/w a half found out she got nausea and weak for 2 weeks. no rash, no increase sob or need for rescue.  never able to go below 5 mg every other day.  October 12, 2009 ov 6 month followup.  Pt c/o wheezing x 3 wks.  She states that "clears up and then comes right back".  Also she c/o some increased SOIB since ran out of her symbicort 3 days ago.  no ocular or articular co's .  no rash. rec resume symbicort and prednsione 5mg  every other day  March 02, 2010  6 wk followup with PFT's.  Pt states that her breathing is much better.   rec add spriva  July 27, 2011cc overall better on spiriva but for past 3 days she has had prod cough with green sputum in the am.   rec augemnt and continue pred 5 mg every other day  October 27, 2010 ov  @ 5 mg/day cc cough better, breathing better walking up to 2 miles a day on the job . Pt denies any significant sore throat, dysphagia, itching,  sneezing,  nasal congestion or excess secretions,  fever, chills, sweats, unintended wt loss, pleuritic or exertional cp, hempoptysis, change in activity tolerance  orthopnea pnd or leg swelling.   Current Medications (verified): 1)  Symbicort 160-4.5 Mcg/act  Aero (Budesonide-Formoterol Fumarate) .... 2 Puffs Each Am and 2 Each Pm 2)  Hydrochlorothiazide 25 Mg Tabs (Hydrochlorothiazide) .... Take 1 Tablet By Mouth Once A Day 3)  Os-Cal 500 + D 500-400 Mg-Unit  Chew (Calcium Carbonate-Vitamin D) .Marland Kitchen.. 1 Tab By Mouth Two Times A Day 4)  Ferrous Sulfate 325 (65 Fe) Mg  Tabs (Ferrous Sulfate) .... Take One Tablet By Mouth Every Other Day 5)  Proair Hfa 108 (90 Base) Mcg/act  Aers (Albuterol Sulfate) .Marland Kitchen.. 1-2 Puffs Every 4-6 Hours As Needed 6)  Prednisone 5 Mg Tabs (Prednisone) .... 5 Mg Every Other Day 7)  Advil 200 Mg Tabs (Ibuprofen) .... As Needed 8)  Flonase 50 Mcg/act Susp (Fluticasone Propionate) .... 2 Sprays Each Nostril Daily As Needed For Sinus Pain and Swelling and Drainage 9)  Ibuprofen 800 Mg Tabs (Ibuprofen) .Marland Kitchen.. 1 Tab By Mouth Every 8 Hours As Needed For Pain and Swelling 10)  Spiriva Handihaler 18 Mcg  Caps (Tiotropium Bromide Monohydrate) .... Two Puffs in Handihaler Daily 11)  Tramadol Hcl 50 Mg Tabs (Tramadol Hcl) .Marland Kitchen.. 1 Tab By Mouth  Q 6 Hours As Needed Pain 12)  Omeprazole 40 Mg Cpdr (Omeprazole) .Marland Kitchen.. 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 Marga Melnick     -Daily prednisone since 2007 with ? adrenal insufficiency iatrogenic ? 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 (ICD-280.9)    Vital Signs:  Patient profile:   50 year old female Weight:      170 pounds O2 Sat:      96 % on Room air Temp:     98.5 degrees F oral Pulse rate:   74 / minute BP sitting:   120 / 84  (left arm)  Vitals Entered By: Vernie Murders (October 27, 2010 11:27 AM)  O2 Flow:  Room air  Physical Exam  Additional Exam:  wt  166 October 12, 2009 > 175 January 18, 2010 > 177 June 08, 2010 > 170 October 28, 2010  amb bf nad HEENT mild turbinate edema.  Oropharynx no thrush or excess pnd or cobblestoning.  No JVD or cervical adenopathy. Mild accessory muscle hypertrophy. Trachea midline, nl thryroid. Chest was hyperinflated by percussion with diminished breath sounds and moderate increased exp time without wheeze. Hoover sign positive at mid inspiration. Regular rate and rhythm without murmur gallop or rub or increase P2 or edema.  Abd: no hsm, nl excursion. Ext warm without cyanosis or clubbing.     Impression & Recommendations:  Problem # 1:  SARCOIDOSIS (ICD-135)  The goal with a chronic steroid dependent illness is always arriving at the lowest effective dose that controls the disease/symptoms and not accepting a set "formula" which is based on statistics that don't take into accound individual variability or the natural hx of the dz in every individual patient, which may well vary over time.    2.5 mg every other day as new floor, watching for symptoms of adrenal insufficiency as well as sarcoid flare.   Each maintenance medication was reviewed in detail including most importantly the difference between  maintenance and as needed and under what circumstances the prns are to be used. See instructions for specific recommendations   Medications Added to Medication List This Visit: 1)  Prednisone 5 Mg Tabs (Prednisone) .... 5 mg  one half every other day  Other Orders: Est. Patient Level III (84132)  Patient Instructions: 1)  Prednisone 5 mg one half odd days 2)  Return to office in 3 months, sooner if needed  Prescriptions: SYMBICORT 160-4.5 MCG/ACT  AERO (BUDESONIDE-FORMOTEROL FUMARATE) 2 puffs each am and 2 each pm  #1 x 11   Entered and Authorized by:   Nyoka Cowden MD   Signed by:   Nyoka Cowden MD on 10/28/2010   Method used:   Electronically to  [facility] [address] [phone]* (retail)       [address]       [place], [place]  [address]       Ph: [phone]       Fax: [phone]   RxID:   [id number]

## 2010-12-21 NOTE — Progress Notes (Signed)
Summary: samples  Phone Note Call from Patient Call back at Home Phone [phone]   Caller: Patient Call For: wert Summary of Call: pt wants samples of pro air / symbicort and spiriva.  Initial call taken by: Tivis Ringer, CNA,  December 14, 2010 1:26 PM  Follow-up for Phone Call        Spoke with pt and advised will leave sample of symbicort and spiriva, but there is no proair available.  I sent in rx to her pharmacy for this.  Follow-up by: Vernie Murders,  December 14, 2010 3:21 PM    Prescriptions: PROAIR HFA 108 (90 BASE) MCG/ACT  AERS (ALBUTEROL SULFATE) 1-2 puffs every 4-6 hours as needed  #1 x 3   Entered by:   Vernie Murders   Authorized by:   Nyoka Cowden MD   Signed by:   Vernie Murders on 12/14/2010   Method used:   Electronically to        [facility] [phone]* (retail)       [address]       [place], [place]  [address]       Ph: [phone]       Fax: [phone]   RxID:   [id number]

## 2010-12-23 ENCOUNTER — Encounter: Payer: Self-pay | Admitting: Adult Health

## 2010-12-23 ENCOUNTER — Ambulatory Visit (INDEPENDENT_AMBULATORY_CARE_PROVIDER_SITE_OTHER): Payer: BC Managed Care – PPO | Admitting: Adult Health

## 2010-12-23 DIAGNOSIS — J309 Allergic rhinitis, unspecified: Secondary | ICD-10-CM

## 2010-12-23 DIAGNOSIS — D869 Sarcoidosis, unspecified: Secondary | ICD-10-CM

## 2010-12-27 ENCOUNTER — Other Ambulatory Visit: Payer: Self-pay | Admitting: Anesthesiology

## 2010-12-27 ENCOUNTER — Other Ambulatory Visit: Payer: Self-pay | Admitting: Obstetrics & Gynecology

## 2010-12-27 ENCOUNTER — Encounter (HOSPITAL_COMMUNITY): Payer: BC Managed Care – PPO

## 2010-12-27 ENCOUNTER — Ambulatory Visit (HOSPITAL_COMMUNITY)
Admission: RE | Admit: 2010-12-27 | Discharge: 2010-12-27 | Disposition: A | Discharge status: Home / Self Care | Payer: BC Managed Care – PPO | Source: Ambulatory Visit | Attending: Obstetrics & Gynecology | Admitting: Obstetrics & Gynecology

## 2010-12-27 DIAGNOSIS — Z01812 Encounter for preprocedural laboratory examination: Secondary | ICD-10-CM | POA: Insufficient documentation

## 2010-12-27 DIAGNOSIS — Z01818 Encounter for other preprocedural examination: Secondary | ICD-10-CM

## 2010-12-27 DIAGNOSIS — Z79899 Other long term (current) drug therapy: Secondary | ICD-10-CM | POA: Insufficient documentation

## 2010-12-27 DIAGNOSIS — D259 Leiomyoma of uterus, unspecified: Secondary | ICD-10-CM | POA: Insufficient documentation

## 2010-12-27 DIAGNOSIS — J984 Other disorders of lung: Secondary | ICD-10-CM | POA: Insufficient documentation

## 2010-12-27 DIAGNOSIS — I10 Essential (primary) hypertension: Secondary | ICD-10-CM | POA: Insufficient documentation

## 2010-12-27 LAB — BASIC METABOLIC PANEL
BUN: 6 mg/dL (ref 6–23)
Calcium: 9.4 mg/dL (ref 8.4–10.5)
Creatinine, Ser: 0.65 mg/dL (ref 0.4–1.2)
GFR calc non Af Amer: >60 mL/min (ref >60)
Glucose, Bld: 81 mg/dL (ref 70–99)
Potassium: 3.3 mEq/L — ABNORMAL LOW (ref 3.5–5.1)

## 2010-12-27 LAB — CBC
Hemoglobin: 11.1 g/dL — ABNORMAL LOW (ref 12.0–15.0)
MCHC: 32.6 g/dL (ref 30.0–36.0)
Platelets: 413 10*3/uL — ABNORMAL HIGH (ref 150–400)
RBC: 4.72 MIL/uL (ref 3.87–5.11)

## 2010-12-27 LAB — ABO/RH: ABO/RH(D): O POS

## 2010-12-27 LAB — HCG, SERUM, QUALITATIVE: Preg, Serum: NEGATIVE

## 2010-12-27 LAB — TYPE AND SCREEN: ABO/RH(D): O POS

## 2010-12-29 NOTE — Letter (Signed)
Summary: Out of Work  [facility]  [address]   [place], [place] [address]   Phone: [phone]  Fax: [phone]    December 23, 2010   Employee:  [name]    To Whom It May Concern:   For Medical reasons, please excuse the above named employee from work for the following dates:  Start:   Friday December 23, 2010  End:   Monday December 26, 2010 - may return to work on this date.  If you need additional information, please feel free to contact our office.         Sincerely,        Tammy Parrett, N.P.

## 2010-12-29 NOTE — Assessment & Plan Note (Signed)
Summary: Acute NP office visit - cough   Primary Provider/Referring Provider:  Asher Muir MD  CC:  cough occasionally producing clear mucus, tickle in throat, and spells cause SOB x3-4days - finished amoxicillin 4days ago given by NP at her work for sinus infection.  History of Present Illness: 80   yowbf  quit smoking 12/05 diagnosed by transbronchial biopsy 5/92 with NCG inflammatory change consistent with sarcoid.  Since that time she's been off and on prednisone several times with symptoms of coughing dyspnea and skin involvement.  The last time she was off prednisone was in 2007 but she flared up with skin changes, anorexia and weight loss and therefore is been on prednisone ever since 2007 having tapered down now to 5 mg per day.  Overall she's been doing much better since she stopped smoking 12/05 and much better since change to Symbicort 160/4.5 2 two times a day, able to do adl's ok without limitations including some steps. No nocturnal exac, excess am sputum, nasal co's or over reflux symptoms.   June 26, 2008 returns for PFT's still prednisone 5mg  one daily plus symbicort 160, no new problems.  Apr 02, 2009 taper prednisone 5 mg one a/w a half found out she got nausea and weak for 2 weeks. no rash, no increase sob or need for rescue.  never able to go below 5 mg every other day.  October 12, 2009 ov 6 month followup.  Pt c/o wheezing x 3 wks.  She states that "clears up and then comes right back".  Also she c/o some increased SOIB since ran out of her symbicort 3 days ago.  no ocular or articular co's .  no rash. rec resume symbicort and prednsione 5mg  every other day  March 02, 2010  6 wk followup with PFT's.  Pt states that her breathing is much better.   rec add spriva  July 27, 2011cc overall better on spiriva but for past 3 days she has had prod cough with green sputum in the am.   rec augemnt and continue pred 5 mg every other day  October 27, 2010 ov  @ 5 mg/day cc  cough better, breathing better walking up to 2 miles a day on the job .  >>rec pred 5mg  1/2 every other day  December 23, 2010--Presents for an acute office visit.Complains of cough occasionally producing clear mucus, tickle in throat, spells cause SOB x3-4days - Finished amoxicillin 4days ago given by NP at her work for sinus infection. Has drianage in throat feels constant tickle-worse at night. No discolored mucus. no fever. Denies chest pain,  orthopnea, hemoptysis, fever, n/v/d, edema, headache.  Medications Prior to Update: 1)  Spiriva Handihaler 18 Mcg  Caps (Tiotropium Bromide Monohydrate) .... Two Puffs in Handihaler Daily 2)  Symbicort 160-4.5 Mcg/act  Aero (Budesonide-Formoterol Fumarate) .... 2 Puffs Each Am and 2 Each Pm 3)  Hydrochlorothiazide 25 Mg Tabs (Hydrochlorothiazide) .... Take 1 Tablet By Mouth Once A Day 4)  Os-Cal 500 + D 500-400 Mg-Unit  Chew (Calcium Carbonate-Vitamin D) .Marland Kitchen.. 1 Tab By Mouth Two Times A Day 5)  Ferrous Sulfate 325 (65 Fe) Mg  Tabs (Ferrous Sulfate) .... Take One Tablet By Mouth Every Other Day 6)  Proair Hfa 108 (90 Base) Mcg/act  Aers (Albuterol Sulfate) .Marland Kitchen.. 1-2 Puffs Every 4-6 Hours As Needed 7)  Prednisone 5 Mg Tabs (Prednisone) .... 5 Mg  One Half Every Other Day 8)  Advil 200 Mg Tabs (Ibuprofen) .... As Needed 9)  Ibuprofen 800 Mg Tabs (Ibuprofen) .Marland Kitchen.. 1 Tab By Mouth Every 8 Hours As Needed For Pain and Swelling 10)  Flonase 50 Mcg/act Susp (Fluticasone Propionate) .... 2 Sprays Each Nostril Daily As Needed For Sinus Pain and Swelling and Drainage 11)  Tramadol Hcl 50 Mg Tabs (Tramadol Hcl) .Marland Kitchen.. 1 Tab By Mouth Q 6 Hours As Needed Pain 12)  Omeprazole 40 Mg Cpdr (Omeprazole) .Marland Kitchen.. 1 Tab By Mouth Daily For Stomach 13)  Metronidazole 500 Mg Tabs (Metronidazole) .... 2 By Mouth Q Am, and 2 Bo Q Pm X 1 Day  Current Medications (verified): 1)  Symbicort 160-4.5 Mcg/act  Aero (Budesonide-Formoterol Fumarate) .... 2 Puffs Each Am and 2 Each Pm 2)   Hydrochlorothiazide 25 Mg Tabs (Hydrochlorothiazide) .... Take 1 Tablet By Mouth Once A Day 3)  Os-Cal 500 + D 500-400 Mg-Unit  Chew (Calcium Carbonate-Vitamin D) .Marland Kitchen.. 1 Tab By Mouth Two Times A Day 4)  Ferrous Sulfate 325 (65 Fe) Mg  Tabs (Ferrous Sulfate) .... Take One Tablet By Mouth Every Other Day 5)  Proair Hfa 108 (90 Base) Mcg/act  Aers (Albuterol Sulfate) .Marland Kitchen.. 1-2 Puffs Every 4-6 Hours As Needed 6)  Prednisone 5 Mg Tabs (Prednisone) .... 5 Mg  One Half Every Other Day 7)  Advil 200 Mg Tabs (Ibuprofen) .... As Needed 8)  Flonase 50 Mcg/act Susp (Fluticasone Propionate) .... 2 Sprays Each Nostril Daily As Needed For Sinus Pain and Swelling and Drainage 9)  Spiriva Handihaler 18 Mcg  Caps (Tiotropium Bromide Monohydrate) .... Two Puffs in Handihaler Daily 10)  Omeprazole 40 Mg Cpdr (Omeprazole) .Marland Kitchen.. 1 Tab By Mouth Daily For Stomach  Allergies (verified): No Known Drug Allergies  Past History:  Family History: Last updated: 11/25/2007 crohn`s in daughter, M-lupus, htn, asthma, no Ca, DM, CAD  Social History: Last updated: 12/23/2010 lives with twin children and grandson.  single;  >20 pack year history, quit in 2005; no EtOH works for [employer]  Risk Factors: Smoking Status: quit (05/13/2010)  Social History: lives with twin children and grandson.  single;  >20 pack year history, quit in 2005; no EtOH works for [employer]  Vital Signs:  Patient profile:   50 year old female Height:      65.5 inches Weight:      173.25 pounds BMI:     28.49 O2 Sat:      94 % on Room air Temp:     98.8 degrees F oral Pulse rate:   82 / minute BP sitting:   114 / 66  (right arm) Cuff size:   regular  Vitals Entered By: Boone Master CNA/MA (December 23, 2010 10:36 AM)  O2 Flow:  Room air CC: cough occasionally producing clear mucus, tickle in throat, spells cause SOB x3-4days - finished amoxicillin 4days ago given by NP at her work for sinus infection Is  Patient Diabetic? No Comments Medications reviewed with patient Daytime contact number verified with patient. Boone Master CNA/MA  December 23, 2010 10:35 AM     Impression & Recommendations:  Problem # 1:  RHINITIS, ALLERGIC (ICD-477.9)  Flare  Plan: Saline nasal rinses  as needed  Flonase 2 puffs in am.  Begin Astepro 2 puffs at bedtime -finish sample. Zyrtec 10mg  at bedtime for 1 week and as needed drainage.  Continue on Prednisone 5mg   2 once daily for 5 days, then 1 daily for 5 days then 1/2 once daily for 5 days and back to 1/2 every other  day .  Please contact office for sooner follow up if symptoms do not improve or worsen  follow up Dr. [name] as planned and as needed Her updated medication list for this problem includes:    Flonase 50 Mcg/act Susp (Fluticasone propionate) .Marland Kitchen... 2 sprays each nostril daily as needed for sinus pain and swelling and drainage  Orders: Est. Patient Level III ([address])  Medications Added to Medication List This Visit: 1)  Prednisone 5 Mg Tabs (Prednisone) .... Taper as directed and then back  one half every other day  Patient Instructions: 1)  Saline nasal rinses  as needed  2)  Flonase 2 puffs in am.  3)  Begin Astepro 2 puffs at bedtime -finish sample. 4)  Zyrtec 10mg  at bedtime for 1 week and as needed drainage.  5)  Continue on Prednisone 5mg   2 once daily for 5 days, then 1 daily for 5 days then 1/2 once daily for 5 days and back to 1/2 every other day .  6)  Please contact office for sooner follow up if symptoms do not improve or worsen  7)  follow up Dr. [name] as planned and as needed Prescriptions: PREDNISONE 5 MG TABS (PREDNISONE) taper as directed and then back  one half every other day  #40 x 0   Entered and Authorized by:   Rubye Oaks NP   Signed by:   Rubye Oaks NP on 12/23/2010   Method used:   Electronically to        [facility] [phone] (retail)       [address]       [place], [place]  [address]       Ph:  [phone]       Fax: [phone]   RxID:   [id number]    Immunization History:  Influenza Immunization History:    Influenza:  historical (08/13/2010)  Pneumovax Immunization History:    Pneumovax:  historical (11/14/2007)

## 2010-12-30 ENCOUNTER — Other Ambulatory Visit: Payer: Self-pay | Admitting: Obstetrics & Gynecology

## 2010-12-30 ENCOUNTER — Ambulatory Visit (HOSPITAL_COMMUNITY)
Admission: RE | Admit: 2010-12-30 | Discharge: 2011-01-01 | Disposition: A | Discharge status: Home / Self Care | Payer: BC Managed Care – PPO | Attending: Obstetrics & Gynecology | Admitting: Obstetrics & Gynecology

## 2010-12-30 DIAGNOSIS — D259 Leiomyoma of uterus, unspecified: Secondary | ICD-10-CM | POA: Insufficient documentation

## 2010-12-30 DIAGNOSIS — J449 Chronic obstructive pulmonary disease, unspecified: Secondary | ICD-10-CM | POA: Insufficient documentation

## 2010-12-30 DIAGNOSIS — Z79899 Other long term (current) drug therapy: Secondary | ICD-10-CM | POA: Insufficient documentation

## 2010-12-30 DIAGNOSIS — Z01812 Encounter for preprocedural laboratory examination: Secondary | ICD-10-CM | POA: Insufficient documentation

## 2010-12-30 DIAGNOSIS — D573 Sickle-cell trait: Secondary | ICD-10-CM | POA: Insufficient documentation

## 2010-12-30 DIAGNOSIS — I10 Essential (primary) hypertension: Secondary | ICD-10-CM | POA: Insufficient documentation

## 2010-12-30 DIAGNOSIS — J4489 Other specified chronic obstructive pulmonary disease: Secondary | ICD-10-CM | POA: Insufficient documentation

## 2010-12-30 DIAGNOSIS — D869 Sarcoidosis, unspecified: Secondary | ICD-10-CM | POA: Insufficient documentation

## 2010-12-30 HISTORY — PX: OTHER SURGICAL HISTORY: SHX169

## 2010-12-31 LAB — BASIC METABOLIC PANEL
BUN: 12 mg/dL (ref 6–23)
CO2: 26 mEq/L (ref 19–32)
Chloride: 99 mEq/L (ref 96–112)
Glucose, Bld: 161 mg/dL — ABNORMAL HIGH (ref 70–99)
Potassium: 3.9 mEq/L (ref 3.5–5.1)

## 2010-12-31 LAB — CBC
HCT: 30.9 % — ABNORMAL LOW (ref 36.0–46.0)
Hemoglobin: 9.7 g/dL — ABNORMAL LOW (ref 12.0–15.0)
MCV: 72.7 fL — ABNORMAL LOW (ref 78.0–100.0)
RBC: 4.25 MIL/uL (ref 3.87–5.11)
RDW: 17.2 % — ABNORMAL HIGH (ref 11.5–15.5)
WBC: 16.6 10*3/uL — ABNORMAL HIGH (ref 4.0–10.5)

## 2011-01-01 NOTE — Op Note (Signed)
[name], [name]                ACCOUNT NO.:  [id number]  MEDICAL RECORD NO.:  [id number]           PATIENT TYPE:  O  LOCATION:  1528                         FACILITY:  [facility]  PHYSICIAN:  Roseanna Rainbow, M.D.DATE OF BIRTH:  03-Aug-1961  DATE OF PROCEDURE:  12/30/2010 DATE OF DISCHARGE:                              OPERATIVE REPORT   PREOPERATIVE DIAGNOSIS:  Symptomatic uterine fibroids.  POSTOPERATIVE DIAGNOSIS:  Symptomatic uterine fibroids.  PROCEDURE:  Total robotic assisted laparoscopic hysterectomy.  ANESTHESIA:  General endotracheal.  ESTIMATED BLOOD LOSS:  100 cc.  FLUIDS AND URINE OUTPUT:  As per anesthesiology.  COMPLICATIONS:  None.  SURGEON:  Roseanna Rainbow, M.D.  ASSISTANTS: 1. Charles A. Clearance Coots, M.D. 2. Telford Nab, R.N.  FINDINGS:  At the time of laparoscopy, there was a 6 cm to 8 cm in diameter pedunculated myoma with a fairly broad base emanating from the posterior cornual region on the left side.  The mid isthmic portions of the tubes were attenuated consistent with the history of the previous tubal ligation.  There were small, 5 mm in diameter hemorrhagic blebs near the prior tubal ligation on the left side.  The remainder of the anatomy was normal in appearance.  DESCRIPTION OF PROCEDURE:  The patient was taken to the operating room and placed in the supine position with the arms tucked.  General anesthesia was then induced.  She was placed in the semi-lithotomy position in McDonald stirrups with all the appropriate precautions. Shoulder blocks were placed.  The abdomen was prepped in the usual sterile fashion.  A time-out was performed to confirm the patient, procedure, antibiotic and allergy status.  The perineum and vagina were prepped with Betadine.  A Foley catheter was placed in the vagina.  A sterile speculum was placed in the vagina.  The anterior lip of the cervix was then grasped with a single-tooth tenaculum.  The cervix  was then dilated with [facility] dilators.  The ZUMI uterine manipulator with a medium colpotomizer ring was then placed.  A pneumo occluder was then placed over the uterine manipulator.  An OG tube was placed by anesthesiology.  A local anesthetic was then injected 3 cm below the costal margin in the midclavicular line in the left upper quadrant. Using a 5 mm Optiview, the abdomen was then entered under direct visualization.  The abdomen was then insufflated with CO2 gas.  The patient's abdominal pressure was appropriately maintained throughout the surgery.  She was then placed in steep Trendelenburg.  The incision sites were marked.  A 12-mm supraumbilical port was placed.  Bilateral 8- mm ports were then placed 10 cm lateral from the camera port.  The left upper quadrant port was then removed and the incision was extended to accommodate a 10-mm trocar and sleeve which were advanced under direct visualization.  The robot was then docked in the usual fashion. Starting on the patient's right, the round ligament was then transected.The anterior and posterior leaflets of the broad ligament were opened. A window was made in the broad ligament and the fallopian tube was then cauterized and transected.  The utero-ovarian ligament  was then cauterized and transected.  A bladder flap was created anteriorly from the patient's right side.  The uterine vessels were then skeletonized and transected.  A C-loop was created in the usual fashion.  Attention was then drawn to the left side.  The round ligament was then transected with cautery.  The anterior and posterior leaflets of the broad ligament were opened.  The bladder flap was then completed anteriorly.  A window was made in the broad ligament.  The fallopian tube was then cauterized and transected.  The utero-ovarian ligament was then cauterized and transected.  The uterine vessels were skeletonized.  The uterine vessels were then coagulated and were  then sealed with the bipolar instrument and transected.  The pneumo occluder was then insufflated.  A C-loop was created on the patient's left side in the usual fashion using monopolar cautery.  A colpotomy was started at 12 o'clock continued to 5 o'clock. The uterus was then deviated to the patient's left side and the colpotomy was completed using monopolar cautery.  The uterus was then delivered into the vagina.  A suture cut needle guide was then inserted and using a #0 Vicryl suture, the vaginal cuff was closed in a running fashion.  The abdomen and pelvis were then irrigated.  All the pedicles were inspected and were noted to be hemostatic.  The instruments were then removed and the port sites were noted to be hemostatic.  The robot was undocked in the usual fashion.  The subcutaneous layers for the port incisions that were larger than or equal to 10 mm were then reapproximated using figure-of-eight sutures of 0 Vicryl on a UR-6 needle.  The skin incisions were closed in a subcuticular fashion using 3-0 Monocryl.  A skin adhesive was then applied.  The pneumo occluder was removed from the vagina.  A sponge stick was then used to swab the vagina.  There was no active bleeding noted.  All the instrument and needle counts were correct x2 including the colpotomy ring.  The patient tolerated the procedure well and taken to the PACU awake and in stable condition.     Roseanna Rainbow, M.D.     Judee Clara  D:  12/30/2010  T:  12/30/2010  Job:  [id number]  Electronically Signed by Antionette Char M.D. on 01/01/2011 02:41:48 PM

## 2011-01-02 NOTE — Discharge Summary (Signed)
  [name], [name]                ACCOUNT NO.:  [id number]  MEDICAL RECORD NO.:  [id number]           PATIENT TYPE:  O  LOCATION:  1528                         FACILITY:  [facility]  PHYSICIAN:  Charles A. Clearance Coots, M.D.DATE OF BIRTH:  [birth year]  DATE OF ADMISSION:  12/30/2010 DATE OF DISCHARGE:  01/01/2011                              DISCHARGE SUMMARY   ADMITTING DIAGNOSIS:  Symptomatic uterine fibroids.  DISCHARGE DIAGNOSIS:  Symptomatic uterine fibroids, status post robotic- assisted total laparoscopic hysterectomy.  CONDITION ON DISCHARGE:  Discharged home in good condition.  REASON FOR ADMISSION:  A 50 year old black female, history of heavy painful periods presents for robotic-assisted total laparoscopic hysterectomy.  PAST MEDICAL HISTORY/ILLNESSES:  Sarcoidosis, hypertension, COPD.  MEDICATIONS:  Prednisone, albuterol inhaler, ibuprofen, Augmentin, Flonase, Symbicort, Spiriva, hydrochlorothiazide.  ALLERGIES:  No known drug allergies  SOCIAL HISTORY:  Positive tobacco.  Negative alcohol or recreational drug use.  REVIEW OF SYSTEMS:  PULMONARY:  Significant for occasional wheezing with history of sarcoidosis and chronic obstructive pulmonary disease. GENITOURINARY:  Painful heavy periods.  PHYSICAL EXAM:  GENERAL:  Well-nourished, well-developed female in no acute distress. VITAL SIGNS:  Afebrile.  Vital signs are Stable. LUNGS:  Clear to auscultation bilaterally. HEART:  Regular rate and rhythm. ABDOMEN:  Soft, nontender. PELVIC EXAM:  Uterus approximately 10 weeks size, irregular contour.  ADMITTING LABORATORY DATA:  Hemoglobin 11.1, hematocrit 34.1, white blood cell count 8700, platelets 413,000.  Sodium 138, potassium 3.3, creatinine 0.65, BUN 6, glucose 81.  HOSPITAL COURSE:  The patient underwent a robotic-assisted total laparoscopic hysterectomy on December 30, 2010.  There were no intraoperative complications.  Postoperative course was  uncomplicated. The patient was discharged home on postop day #2 in good condition.  DISCHARGE LABORATORY DATA:  Hemoglobin 9.7, hematocrit 30.9, white blood cell count 16,600, platelets 364,000.  Sodium 134, potassium 3.9, BUN 12, creatinine 0.81, glucose 161.  DISCHARGE DISPOSITION:  Medications, continue medications they were taking prior to admission to the hospital.  Percocet and ibuprofen were prescribed for pain.  Routine written instructions were given for discharge after laparoscopic hysterectomy.  The patient is to call office for followup appointment in 2 weeks.     Charles A. Clearance Coots, M.D.     CAH/MEDQ  D:  01/01/2011  T:  01/01/2011  Job:  [id number]  cc:   Telford Nab, R.N. [address] [place], [place] [address]  Roseanna Rainbow, M.D. Fax: [phone]  Electronically Signed by Coral Ceo M.D. on 01/02/2011 09:48:59 AM

## 2011-01-31 LAB — CBC
HCT: 36.2 % (ref 36.0–46.0)
MCHC: 33.8 g/dL (ref 30.0–36.0)
MCV: 81.7 fL (ref 78.0–100.0)
Platelets: 342 10*3/uL (ref 150–400)

## 2011-01-31 LAB — CARDIAC PANEL(CRET KIN+CKTOT+MB+TROPI)
CK, MB: 0.8 ng/mL (ref 0.3–4.0)
CK, MB: 0.8 ng/mL (ref 0.3–4.0)
Total CK: 63 U/L (ref 7–177)
Troponin I: 0.02 ng/mL (ref 0.00–0.06)
Troponin I: <0.01 ng/mL (ref 0.00–0.06)

## 2011-01-31 LAB — BASIC METABOLIC PANEL
BUN: 6 mg/dL (ref 6–23)
CO2: 32 mEq/L (ref 19–32)
Chloride: 103 mEq/L (ref 96–112)
Creatinine, Ser: 0.67 mg/dL (ref 0.4–1.2)
Glucose, Bld: 70 mg/dL (ref 70–99)
Potassium: 3.2 mEq/L — ABNORMAL LOW (ref 3.5–5.1)

## 2011-01-31 LAB — POCT CARDIAC MARKERS
CKMB, poc: <1 ng/mL — ABNORMAL LOW (ref 1.0–8.0)
Myoglobin, poc: 83.6 ng/mL (ref 12–200)
Troponin i, poc: <0.05 ng/mL (ref 0.00–0.09)

## 2011-01-31 LAB — LIPID PANEL
Cholesterol: 183 mg/dL (ref 0–200)
LDL Cholesterol: 107 mg/dL — ABNORMAL HIGH (ref 0–99)
Triglycerides: 63 mg/dL (ref <150)

## 2011-01-31 LAB — DIFFERENTIAL
Lymphs Abs: 1.4 10*3/uL (ref 0.7–4.0)
Monocytes Relative: 9 % (ref 3–12)
Neutro Abs: 6.5 10*3/uL (ref 1.7–7.7)
Neutrophils Relative %: 73 % (ref 43–77)

## 2011-02-02 ENCOUNTER — Telehealth: Payer: Self-pay | Admitting: Internal Medicine

## 2011-02-02 NOTE — Telephone Encounter (Signed)
Pt aware sample left upfront for pick up  [name], MA

## 2011-02-03 NOTE — H&P (Signed)
[name], [name]                ACCOUNT NO.:  [id number]  MEDICAL RECORD NO.:  [id number]           PATIENT TYPE:  O  LOCATION:  PADM                         FACILITY:  [facility]  PHYSICIAN:  Roseanna Rainbow, M.D.DATE OF BIRTH:  10-17-1961  DATE OF ADMISSION:  12/27/2010 DATE OF DISCHARGE:                             HISTORY & PHYSICAL   CHIEF COMPLAINT:  The patient is a 50 year old with symptomatic uterine fibroids, who presents for a total robotic hysterectomy.  HISTORY OF PRESENT ILLNESS:  The patient has a long history of menorrhagia.  She also carries a diagnosis of uterine fibroids for number of years.  In the context of the bleeding, there is a history of iron deficiency anemia.  Workup to date has included serial ultrasounds dating back to 2006 that have essentially shown a stable and large pedunculated fibroid.  A recent ultrasound showed this fibroid to be approximately 6 cm in diameter.  A recent hemoglobin on October 28, 2011 was 11.5.  The patient also complains of pelvic pain.  PAST GYNECOLOGIC HISTORY:  Please see the above.  There is also a history of biopsy-proven mild cervical dysplasia. There is a history of a bilateral tubal ligation.  Menstrual history, please see the above. Menstrual periods are described as heavy.  The flow is heavy.  There is moderate dysmenorrhea.  PAST MEDICAL HISTORY: 1. There is a history of sarcoidosis. 2. Chronic hypertension. 3. She also has a history of COPD.  SOCIAL HISTORY:  There is a remote history of tobacco use, status post successful smoking cessation in December 2005.  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  Denies illicit drug use.  ALLERGIES:  None known.  FAMILY HISTORY:  Remarkable for a mother with hypertension and asthma as well as a maternal grandmother.  There is also a family history of SLE.  PAST OBSTETRICAL HISTORY:  There is a history of a previous  cesarean delivery and a bilateral tubal ligation.  The patient has been pregnant 3 times.  She has 4 living children.  There is a history of 1 cesarean delivery.  MEDICATIONS: 1. Hydrochlorothiazide 25 mg. 2. Iron. 3. Multivitamin. 4. ProAir HFA inhalation aerosol solution 108 mcg. 5. Symbicort inhalation aerosol 160/4.5 mcg/ACT. 6. Prednisone oral tablet 5 mg. 7. Caltrate 600 plus D plus oral tablet chewable 600-400 mg/unit.  REVIEW OF SYSTEMS:  GU:  Please see the above.  PHYSICAL EXAMINATION:  VITAL SIGNS:  Temperature 99.1, pulse 87, blood pressure 146/81. GENERAL:  No apparent distress.  Well-developed, well-nourished African American female, in no apparent distress. ABDOMEN:  Nontender.  No organomegaly. GYN:  On bimanual exam, the uterus is anteverted, irregular in shape approximately 10 weeks and aggregate size.  There is a left-sided adnexal mass, mobile, slightly tender, firm.  ASSESSMENT:  Symptomatic uterine fibroids, possible intermittent torsion of the pedunculated myoma.  The patient also has a complicated pulmonary history with a history of sarcoidosis.  She is followed by Dr. [name] of Pulmonology.  The patient has been cleared for surgery by Dr. [name].  PLAN:  A total robotic  hysterectomy is planned.  The risks, benefits, and alternative forms of management were reviewed with the patient including but not limited to a possible laparotomy, bleeding, infection, possible injury to the abdominopelvic viscera or a possible thromboembolic complication.     Roseanna Rainbow, M.D.     [name]  D:  12/28/2010  T:  12/29/2010  Job:  [id number]  Electronically Signed by Antionette Char M.D. on 01/01/2011 02:43:03 PM

## 2011-02-08 ENCOUNTER — Encounter (HOSPITAL_COMMUNITY): Payer: Self-pay | Admitting: Radiology

## 2011-02-08 ENCOUNTER — Emergency Department (HOSPITAL_COMMUNITY): Payer: BC Managed Care – PPO

## 2011-02-08 ENCOUNTER — Emergency Department (HOSPITAL_COMMUNITY)
Admission: EM | Admit: 2011-02-08 | Discharge: 2011-02-08 | Disposition: A | Discharge status: Home / Self Care | Payer: BC Managed Care – PPO | Attending: Emergency Medicine | Admitting: Emergency Medicine

## 2011-02-08 DIAGNOSIS — I10 Essential (primary) hypertension: Secondary | ICD-10-CM | POA: Insufficient documentation

## 2011-02-08 DIAGNOSIS — D869 Sarcoidosis, unspecified: Secondary | ICD-10-CM | POA: Insufficient documentation

## 2011-02-08 DIAGNOSIS — R209 Unspecified disturbances of skin sensation: Secondary | ICD-10-CM | POA: Insufficient documentation

## 2011-02-08 DIAGNOSIS — J99 Respiratory disorders in diseases classified elsewhere: Secondary | ICD-10-CM | POA: Insufficient documentation

## 2011-02-08 DIAGNOSIS — R0602 Shortness of breath: Secondary | ICD-10-CM | POA: Insufficient documentation

## 2011-02-08 DIAGNOSIS — R002 Palpitations: Secondary | ICD-10-CM | POA: Insufficient documentation

## 2011-02-08 DIAGNOSIS — I498 Other specified cardiac arrhythmias: Secondary | ICD-10-CM | POA: Insufficient documentation

## 2011-02-08 DIAGNOSIS — I491 Atrial premature depolarization: Secondary | ICD-10-CM | POA: Insufficient documentation

## 2011-02-08 DIAGNOSIS — J984 Other disorders of lung: Secondary | ICD-10-CM | POA: Insufficient documentation

## 2011-02-08 HISTORY — DX: Essential (primary) hypertension: I10

## 2011-02-08 LAB — CBC
HCT: 33.6 % — ABNORMAL LOW (ref 36.0–46.0)
Hemoglobin: 11 g/dL — ABNORMAL LOW (ref 12.0–15.0)
RDW: 17.5 % — ABNORMAL HIGH (ref 11.5–15.5)
WBC: 7.4 10*3/uL (ref 4.0–10.5)

## 2011-02-08 LAB — POCT I-STAT, CHEM 8
Creatinine, Ser: 0.8 mg/dL (ref 0.4–1.2)
Hemoglobin: 12.2 g/dL (ref 12.0–15.0)
Potassium: 3.3 mEq/L — ABNORMAL LOW (ref 3.5–5.1)
Sodium: 139 mEq/L (ref 135–145)

## 2011-02-08 LAB — POCT CARDIAC MARKERS
CKMB, poc: <1 ng/mL — ABNORMAL LOW (ref 1.0–8.0)
Myoglobin, poc: 120 ng/mL (ref 12–200)

## 2011-02-08 MED ORDER — IOHEXOL 300 MG/ML  SOLN
100.0000 mL | Freq: Once | INTRAMUSCULAR | Status: AC | PRN
  Administered 2011-02-08: 100 mL via INTRAVENOUS

## 2011-02-16 LAB — POCT I-STAT, CHEM 8
Calcium, Ion: 1.21 mmol/L (ref 1.12–1.32)
Glucose, Bld: 85 mg/dL (ref 70–99)
HCT: 37 % (ref 36.0–46.0)
Hemoglobin: 12.6 g/dL (ref 12.0–15.0)
Potassium: 3.3 mEq/L — ABNORMAL LOW (ref 3.5–5.1)
TCO2: 30 mmol/L (ref 0–100)

## 2011-03-14 ENCOUNTER — Telehealth: Payer: Self-pay | Admitting: Internal Medicine

## 2011-03-14 NOTE — Telephone Encounter (Signed)
lmomtcb x1. Sample of each left upfront for pick up

## 2011-03-14 NOTE — Telephone Encounter (Signed)
erro- duplicate msg. Tivis Ringer

## 2011-03-14 NOTE — Telephone Encounter (Signed)
Pt called back and is aware samples up front for pick up.

## 2011-03-29 ENCOUNTER — Ambulatory Visit (INDEPENDENT_AMBULATORY_CARE_PROVIDER_SITE_OTHER): Payer: BC Managed Care – PPO | Admitting: Family Medicine

## 2011-03-29 ENCOUNTER — Encounter: Payer: Self-pay | Admitting: Family Medicine

## 2011-03-29 VITALS — BP 133/82 | HR 77 | Temp 97.7°F | Ht 64.0 in | Wt 170.4 lb

## 2011-03-29 DIAGNOSIS — M545 Low back pain, unspecified: Secondary | ICD-10-CM

## 2011-03-29 DIAGNOSIS — D869 Sarcoidosis, unspecified: Secondary | ICD-10-CM

## 2011-03-29 DIAGNOSIS — I10 Essential (primary) hypertension: Secondary | ICD-10-CM

## 2011-03-29 DIAGNOSIS — J4489 Other specified chronic obstructive pulmonary disease: Secondary | ICD-10-CM

## 2011-03-29 DIAGNOSIS — R42 Dizziness and giddiness: Secondary | ICD-10-CM

## 2011-03-29 DIAGNOSIS — J449 Chronic obstructive pulmonary disease, unspecified: Secondary | ICD-10-CM

## 2011-03-29 NOTE — Patient Instructions (Signed)
Follow-up in 1-2 weeksDizziness Dizziness is a common problem with numerous causes. Some of these causes are:  Middle ear problems.   Side effects of infections.   Aging.   Side effects of medicines.   Problems with circulation.  You have been examined and no life-threatening reasons were found for the dizziness. None of the problems listed above were found to be a cause that would require further hospital observation. Your caregiver feels it is safe for you to go home and to be observed by a family member or friend. Your caregiver feels nothing that is happening is a danger to you. HOME CARE INSTRUCTIONS  Drink enough water and fluids to keep your urine clear or pale yellow. This is especially important in very hot weather. As you grow older, it is also important in cold weather.   If you are dizzy from medicines, take them as directed. When taking blood pressure medicines, it is especially important to get up slowly.   Rise slowly from chairs and steady yourself until you feel okay.   In the morning, first sit up on the side of the bed. When this seems okay, stand slowly while holding onto something until you know your balance is fine.   If you get dizzy from standing still in one place for too long, be sure to move the legs often. Tighten and relax the muscles in the legs while standing.   If dizziness continues to be a problem, have someone stay with you for a day or two. Do this until you feel you are doing well enough to stay alone. Have that person call your caregiver if he or she notices changes in you that are concerning.  SEEK IMMEDIATE MEDICAL CARE IF:  Your dizziness or lightheadedness gets worse.   You feel sick to your stomach (nauseous) or throw up (vomit).   You develop problems with talking, walking, weakness, or using the arms, hands, or legs.   You are not thinking clearly or has difficulty forming sentences. It may take a friend or family member to determine if  your thinking is normal.   You develop chest pain, belly (abdominal) pain, or shortness of breath.   Your vision changes.   There are side effects from medicine which seem to be getting worse rather than better.  MAKE SURE YOU:  Understand these instructions.   Will watch your condition.   Will get help right away if you are not doing well or get worse.  Document Released: 04/25/2001 Document Re-Released: 01/24/2010 [facility] Patient Information 2011 [place], [place].

## 2011-03-29 NOTE — Progress Notes (Signed)
  Subjective:    Patient ID: [name], female    DOB: 1961/06/24, 50 y.o.   MRN: [id number]  HPI  HYPERTENSION  BP Readings from Last 3 Encounters:  03/29/11 135/86  12/23/10 114/66  11/08/10 133/77    Hypertension ROS: taking medications as instructed, no medication side effects noted, home BP monitoring in range of 130's systolic over 70's diastolic, no TIA's, no chest pain on exertion, no dyspnea on exertion, no swelling of ankles and no intermittent claudication symptoms   Dizziness:  Notes feeling dizzy for the past two weeks, when checked BP during an episode- was in 150's.  Dizziness lasts all day.  Described as lightheaded, rooms spinning.  No headache, fever, nausea, vomiting, change in vision, ringing in ears. No change with standing or rolling over in bed.  Self resolves.  Of note has hysterectomy 3 months ago and has histroy of iron def anemia.  Back pain:  Right upper back back x 2 days.  Onset upon awakening one morning.  No recent injury or overuse.  No numbess, weakness, tingling, change bowel or bladder habits.  No problams with arm movements.  Has taken some advil with some relief.      Review of Systems see HPI     Objective:   Physical Exam  Constitutional: She is oriented to person, place, and time. She appears well-developed and well-nourished.  HENT:  Head: Normocephalic and atraumatic.  Mouth/Throat: Oropharynx is clear and moist. No oropharyngeal exudate.  Eyes: Conjunctivae and EOM are normal. Pupils are equal, round, and reactive to light.  Neck: Normal range of motion. Neck supple. No JVD present. No thyromegaly present.  Cardiovascular: Normal rate and regular rhythm.   No murmur heard. Pulmonary/Chest: Effort normal and breath sounds normal. No respiratory distress. She has no wheezes.  Abdominal: Soft. Bowel sounds are normal.  Musculoskeletal: She exhibits no edema.       Normal ROM of shoulder and neck.  Tender to palpation over left  trapezius near shoulder blade.  Neurological: She is alert and oriented to person, place, and time. She has normal reflexes. She displays normal reflexes. She exhibits normal muscle tone. Coordination normal.       negative DIX-HALLPIKE  Psychiatric: She has a normal mood and affect.          Assessment & Plan:

## 2011-03-31 ENCOUNTER — Encounter: Payer: Self-pay | Admitting: Family Medicine

## 2011-03-31 DIAGNOSIS — R42 Dizziness and giddiness: Secondary | ICD-10-CM | POA: Insufficient documentation

## 2011-03-31 NOTE — Assessment & Plan Note (Signed)
Mild dizziness without signs of syncope or presyncope.  Exam today not consistent with orthostasis, BPPV, cerebella, or anemia.  Advised increase hydration with water, will follow-up in 1 week to reassess.

## 2011-03-31 NOTE — Assessment & Plan Note (Addendum)
Acute muscle strain.  Advised continue ibuprofen.

## 2011-03-31 NOTE — Assessment & Plan Note (Signed)
BP ok today continue current management

## 2011-04-17 ENCOUNTER — Other Ambulatory Visit: Payer: Self-pay | Admitting: Internal Medicine

## 2011-04-18 ENCOUNTER — Emergency Department (HOSPITAL_COMMUNITY)
Admission: EM | Admit: 2011-04-18 | Discharge: 2011-04-18 | Disposition: A | Discharge status: Home / Self Care | Payer: BC Managed Care – PPO | Attending: Emergency Medicine | Admitting: Emergency Medicine

## 2011-04-18 DIAGNOSIS — Z79899 Other long term (current) drug therapy: Secondary | ICD-10-CM | POA: Insufficient documentation

## 2011-04-18 DIAGNOSIS — I10 Essential (primary) hypertension: Secondary | ICD-10-CM | POA: Insufficient documentation

## 2011-04-18 DIAGNOSIS — R51 Headache: Secondary | ICD-10-CM | POA: Insufficient documentation

## 2011-04-18 DIAGNOSIS — R42 Dizziness and giddiness: Secondary | ICD-10-CM | POA: Insufficient documentation

## 2011-04-18 DIAGNOSIS — D869 Sarcoidosis, unspecified: Secondary | ICD-10-CM | POA: Insufficient documentation

## 2011-04-19 ENCOUNTER — Telehealth: Payer: Self-pay | Admitting: Internal Medicine

## 2011-04-19 NOTE — Telephone Encounter (Signed)
Spoke with pt and notified that 1 sample of spiriva and symbicort left up front for pick up.

## 2011-04-19 NOTE — Telephone Encounter (Signed)
Pat last seen by MW 12.15.11, upcoming appt 6.20.12.    LMOM TCB to verify the meds that pt is requesting samples of.

## 2011-04-24 ENCOUNTER — Ambulatory Visit (INDEPENDENT_AMBULATORY_CARE_PROVIDER_SITE_OTHER): Payer: BC Managed Care – PPO | Admitting: Family Medicine

## 2011-04-24 ENCOUNTER — Encounter: Payer: Self-pay | Admitting: Family Medicine

## 2011-04-24 VITALS — BP 120/80 | HR 89 | Temp 97.7°F | Wt 170.0 lb

## 2011-04-24 DIAGNOSIS — R42 Dizziness and giddiness: Secondary | ICD-10-CM

## 2011-04-24 LAB — CBC
MCH: 22.8 pg — ABNORMAL LOW (ref 26.0–34.0)
MCHC: 31.4 g/dL (ref 30.0–36.0)
Platelets: 386 10*3/uL (ref 150–400)
RBC: 4.87 MIL/uL (ref 3.87–5.11)
RDW: 17.6 % — ABNORMAL HIGH (ref 11.5–15.5)

## 2011-04-24 LAB — COMPREHENSIVE METABOLIC PANEL
ALT: 8 U/L (ref 0–35)
AST: 15 U/L (ref 0–37)
Alkaline Phosphatase: 72 U/L (ref 39–117)
Creat: 0.64 mg/dL (ref 0.50–1.10)
Total Bilirubin: 0.3 mg/dL (ref 0.3–1.2)

## 2011-04-24 LAB — TSH: TSH: 0.641 u[IU]/mL (ref 0.350–4.500)

## 2011-04-24 NOTE — Progress Notes (Signed)
  Subjective:    Patient ID: [name], female    DOB: 02/02/1961, 50 y.o.   MRN: [id number]  HPI Here for re-evaluation of dizziness.  Was seen in office several weeks ago with neg dix hallpike, described as room spinning at that time, thought to be due to mild dehydration.  Went to ER 6/5 was dx with vertigo, unclear etiology, normal orthostatics.  Has taken meclizine which helps, but causes too much sedation.  Feels overall a little better.  No longer feels like room is spinning, now "lightheaded" when she stands up or when she sits too long.  No trouble when turning her head, rolling over in bed  No weakness, numbness, tingling, ear ringing, change in hearing, nausea, vomiting, or headache.  Dizziness is constant every day.  Somewhat better but   Still disabling for work.  .reviewed pmh, psh, surgical history, medications, allergies. Reviewed ER discharge summary. Marland Kitchenpm  Review of Systems  Constitutional: Negative for fever and chills.  HENT: Negative for ear pain, neck pain and tinnitus.   Respiratory: Negative for cough and shortness of breath.   Cardiovascular: Negative for chest pain and palpitations.  Gastrointestinal: Negative for nausea, diarrhea, constipation and abdominal distention.  Musculoskeletal: Negative for myalgias and gait problem.  Skin: Negative for rash.  Neurological: Positive for dizziness and light-headedness. Negative for weakness, numbness and headaches.  Psychiatric/Behavioral: The patient is not nervous/anxious.        Objective:   Physical Exam  Constitutional: She is oriented to person, place, and time. She appears well-developed and well-nourished. No distress.  HENT:  Head: Normocephalic and atraumatic.  Right Ear: External ear normal.  Left Ear: External ear normal.  Mouth/Throat: Oropharynx is clear and moist. No oropharyngeal exudate.  Eyes: Conjunctivae are normal. Pupils are equal, round, and reactive to light.       Left amblyopia, from  childhood  Neck: Neck supple. No thyromegaly present.  Cardiovascular: Normal rate and regular rhythm.   No murmur heard. Pulmonary/Chest: Effort normal and breath sounds normal. No respiratory distress. She has no wheezes.  Abdominal: Soft. Bowel sounds are normal.  Musculoskeletal: Normal range of motion. She exhibits no edema.  Neurological: She is alert and oriented to person, place, and time. She has normal strength and normal reflexes. She displays no tremor and normal reflexes. No cranial nerve deficit. She exhibits normal muscle tone. She displays a negative Romberg sign. Coordination and gait normal.  Psychiatric: She has a normal mood and affect.   Neg pronator drift.       Assessment & Plan:

## 2011-04-24 NOTE — Assessment & Plan Note (Addendum)
Non-specific etiology.  No evidence of CVA, or central process.  Neuro exam normal.  No evidence of increased anxiety/stress.  Advised lower dose of meclizine- 10 mg daily or half of her previous  25 mg pill before bed.  Given red flags for return.  Otherwise expect to continue to improve slowly.  Will check TSH, CBC, CMET today.  Do not think she needs imaging given lack of neurological findings today.

## 2011-04-24 NOTE — Patient Instructions (Signed)
Taker half dose of meclizine or try bonine available OTC.  Follow-up if no improvement   Dizziness Dizziness is a common problem with numerous causes. Some of these causes are:  Middle ear problems.   Side effects of infections.   Aging.   Side effects of medicines.   Problems with circulation.  You have been examined and no life-threatening reasons were found for the dizziness. None of the problems listed above were found to be a cause that would require further hospital observation. Your caregiver feels it is safe for you to go home and to be observed by a family member or friend. Your caregiver feels nothing that is happening is a danger to you. HOME CARE INSTRUCTIONS  Drink enough water and fluids to keep your urine clear or pale yellow. This is especially important in very hot weather. As you grow older, it is also important in cold weather.   If you are dizzy from medicines, take them as directed. When taking blood pressure medicines, it is especially important to get up slowly.   Rise slowly from chairs and steady yourself until you feel okay.   In the morning, first sit up on the side of the bed. When this seems okay, stand slowly while holding onto something until you know your balance is fine.   If you get dizzy from standing still in one place for too long, be sure to move the legs often. Tighten and relax the muscles in the legs while standing.   If dizziness continues to be a problem, have someone stay with you for a day or two. Do this until you feel you are doing well enough to stay alone. Have that person call your caregiver if he or she notices changes in you that are concerning.  SEEK IMMEDIATE MEDICAL CARE IF:  Your dizziness or lightheadedness gets worse.   You feel sick to your stomach (nauseous) or throw up (vomit).   You develop problems with talking, walking, weakness, or using the arms, hands, or legs.   You are not thinking clearly or has difficulty  forming sentences. It may take a friend or family member to determine if your thinking is normal.   You develop chest pain, belly (abdominal) pain, or shortness of breath.   Your vision changes.   There are side effects from medicine which seem to be getting worse rather than better.  MAKE SURE YOU:  Understand these instructions.   Will watch your condition.   Will get help right away if you are not doing well or get worse.  Document Released: 04/25/2001 Document Re-Released: 01/24/2010 [facility] Patient Information 2011 [place], [place].

## 2011-04-28 ENCOUNTER — Encounter: Payer: Self-pay | Admitting: Internal Medicine

## 2011-04-28 ENCOUNTER — Encounter: Payer: Self-pay | Admitting: Family Medicine

## 2011-05-03 ENCOUNTER — Ambulatory Visit (INDEPENDENT_AMBULATORY_CARE_PROVIDER_SITE_OTHER)
Admission: RE | Admit: 2011-05-03 | Discharge: 2011-05-03 | Disposition: A | Discharge status: Home / Self Care | Payer: BC Managed Care – PPO | Source: Ambulatory Visit | Attending: Internal Medicine | Admitting: Internal Medicine

## 2011-05-03 ENCOUNTER — Encounter: Payer: Self-pay | Admitting: Internal Medicine

## 2011-05-03 ENCOUNTER — Encounter: Payer: Self-pay | Admitting: *Deleted

## 2011-05-03 ENCOUNTER — Ambulatory Visit (INDEPENDENT_AMBULATORY_CARE_PROVIDER_SITE_OTHER): Payer: BC Managed Care – PPO | Admitting: Internal Medicine

## 2011-05-03 VITALS — BP 130/84 | HR 83 | Temp 98.1°F | Ht 64.0 in | Wt 169.0 lb

## 2011-05-03 DIAGNOSIS — J449 Chronic obstructive pulmonary disease, unspecified: Secondary | ICD-10-CM

## 2011-05-03 DIAGNOSIS — D869 Sarcoidosis, unspecified: Secondary | ICD-10-CM

## 2011-05-03 NOTE — Patient Instructions (Addendum)
We will call you with your cxr results  Please schedule a follow up visit in 3 months but call sooner if needed   Ok to try off spiriva to see if it makes any difference with your breathing with exertion and if so restart it

## 2011-05-03 NOTE — Progress Notes (Signed)
Subjective:     Patient ID: [name], female   DOB: Sep 02, 1961, 50 y.o.   MRN: [id number]  HPI  38 yobf quit smoking 10/2004 dx by transbronchial biopsy 5/92 with NCG inflammatory change consistent with sarcoid. Since that time she's been off and on prednisone several times with symptoms of coughing dyspnea and skin involvement.    Overall she's been doing much better since she stopped smoking 12/05 and much better since change to Symbicort 160/4.5 2 two times a day, able to do adl's ok without limitations including some steps.  June 26, 2008 returns for PFT's still prednisone 5mg  one daily plus symbicort 160, no new problems.    March 02, 2010 6 wk followup with PFT's. Pt states that her breathing is much better. rec add spriva   July 27, 2011cc overall better on spiriva then prod cough x 3 d  with green sputum in the am. rec augemnt and continue pred 5 mg every other day   October 27, 2010 ov @ 5 mg/day cc cough better, breathing better walking up to 2 miles a day on the job . >>rec pred 5mg  1/2 every other day   Stopped prednisone even the qod late Feb 2012 > no flare of symptoms or nausea/fatigue so remained off   05/03/2011 ov/ Wert no flare of cough or sob off prednisone.  No ocular or articular symptoms, no rash  Sleeping ok without nocturnal  or early am exac of resp c/o's or need for noct saba. Pt denies any significant sore throat, dysphagia, itching, sneezing,  nasal congestion or excess/ purulent secretions,  fever, chills, sweats, unintended wt loss, pleuritic or exertional cp, hempoptysis, orthopnea pnd or leg swelling.    Also denies any obvious fluctuation of symptoms with weather or environmental changes or other aggravating or alleviating factors.      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 Marga Melnick  -Daily prednisone since 2007 with ? adrenal insufficiency iatrogenic > off completely Feb 2012 with no problem ? 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 (ICD-280.9)    Family History:  crohn`s in daughter, M-lupus, htn, asthma,  no Ca, DM, CAD   Social History:   lives with twin children and grandson. single; >20 pack year history, quit in 2005; no EtOH  works for [employer]  Risk Factors:  Smoking Status: quit (05/13/2010)          Review of Systems     Objective:   Physical Exam wt 166 October 12, 2009 > 175 January 18, 2010 > 177 June 08, 2010 > 170 October 28, 2010 >  169 05/03/11 amb bf nad  HEENT mild turbinate edema. Oropharynx no thrush or excess pnd or cobblestoning. No JVD or cervical adenopathy. Mild accessory muscle hypertrophy. Trachea midline, nl thryroid. Chest was hyperinflated by percussion with diminished breath sounds and moderate increased exp time without wheeze. Hoover sign positive at mid inspiration. Regular rate and rhythm without murmur  gallop or rub or increase P2 or edema. Abd: no hsm, nl excursion. Ext warm without cyanosis or clubbing.     cxr 05/03/11 Stable areas of scarring throughout the lungs. No active disease.   Assessment:         Plan:

## 2011-05-04 ENCOUNTER — Telehealth: Payer: Self-pay | Admitting: Internal Medicine

## 2011-05-04 NOTE — Telephone Encounter (Signed)
Notes Recorded by Vernie Murders, CMA on 05/04/2011 at 11:13 AM [email or web address] Notes Recorded by Sandrea Hughs, MD on 05/04/2011 at 8:53 AM Call pt: Reviewed cxr and no acute change so no change in recommendations made at [facility] Notes Recorded by Sandrea Hughs, MD on 05/04/2011 at 6:13 AM Call pt: Reviewed cxr and no acute change so no change in recommendations made at ov- no evidence active sarcoid    Called, spoke with pt.  She was informed of CXR results and recs as stated above per MW.  She verbalized understanding of this.

## 2011-05-04 NOTE — Progress Notes (Signed)
Quick Note:  Spoke with pt. She was informed of CXR results and recs per MW. She verbalized understanding of this. ______

## 2011-05-05 ENCOUNTER — Encounter: Payer: Self-pay | Admitting: Internal Medicine

## 2011-05-05 NOTE — Assessment & Plan Note (Addendum)
Ok off steroids since Feb 2012  The goal with a chronic steroid dependent illness is always arriving at the lowest effective dose that controls the disease/symptoms and not accepting a set "formula" which is based on statistics or guidelines that don't always take into account patient  variability or the natural hx of the dz in every individual patient, which may well vary over time.  For now therefore I recommend the patient maintain  Off steroids completely

## 2011-05-05 NOTE — Assessment & Plan Note (Addendum)
GOLD III but well compensated on present rx, reviewed optimal rx    Each maintenance medication was reviewed in detail including most importantly the difference between maintenance and as needed and under what circumstances the prns are to be used.  Please see instructions for details which were reviewed in writing and the patient given a copy.  See instructions for specific recommendations which were reviewed directly with the patient who was given a copy with highlighter outlining the key components.

## 2011-06-05 ENCOUNTER — Telehealth: Payer: Self-pay | Admitting: Internal Medicine

## 2011-06-05 ENCOUNTER — Encounter: Payer: Self-pay | Admitting: Internal Medicine

## 2011-06-05 ENCOUNTER — Encounter: Payer: Self-pay | Admitting: *Deleted

## 2011-06-05 ENCOUNTER — Ambulatory Visit (INDEPENDENT_AMBULATORY_CARE_PROVIDER_SITE_OTHER): Payer: BC Managed Care – PPO | Admitting: Internal Medicine

## 2011-06-05 DIAGNOSIS — J449 Chronic obstructive pulmonary disease, unspecified: Secondary | ICD-10-CM

## 2011-06-05 DIAGNOSIS — J4489 Other specified chronic obstructive pulmonary disease: Secondary | ICD-10-CM

## 2011-06-05 DIAGNOSIS — D869 Sarcoidosis, unspecified: Secondary | ICD-10-CM

## 2011-06-05 MED ORDER — PREDNISONE (PAK) 10 MG PO TABS: ORAL_TABLET | ORAL | Status: AC

## 2011-06-05 MED ORDER — AZITHROMYCIN 250 MG PO TABS: ORAL_TABLET | ORAL | Status: AC

## 2011-06-05 NOTE — Telephone Encounter (Signed)
Ov me or Tammy NP asap, bring all meds with her

## 2011-06-05 NOTE — Assessment & Plan Note (Addendum)
Acute flare in setting up uri with rhinitis/ ? Sinusitis (doubt)  Explained natural history of uri and why it's necessary in patients at risk to treat GERD aggressively  at least  short term   to reduce risk of evolving cyclical cough initially  triggered by epithelial injury and a heightened sensitivty to the effects of any upper airway irritants,  most importantly acid - related.  That is, the more sensitive the epithelium damaged for virus, the more the cough, the more the secondary reflux (especially in those prone to reflux) the more the irritation of the sensitive mucosa and so on in a cyclical pattern.   The proper method of use, as well as anticipated side effects, of this metered-dose inhaler are discussed and demonstrated to the patient. Improved from 50> 75% with coaching.    Each maintenance medication was reviewed in detail including most importantly the difference between maintenance and as needed and under what circumstances the prns are to be used.  Please see instructions for details which were reviewed in writing and the patient given a copy.

## 2011-06-05 NOTE — Progress Notes (Signed)
Subjective:     Patient ID: [name], female   DOB: 1961/07/25, 50 y.o.   MRN: [id number]  HPI  83 yobf quit smoking 10/2004 dx by transbronchial biopsy 5/92 with NCG inflammatory change consistent with sarcoid. Since that time she's been off and on prednisone several times with symptoms of coughing dyspnea and skin involvement.    Overall she's been doing much better since she stopped smoking 12/05 and much better since change to Symbicort 160/4.5 2 two times a day, able to do adl's ok without limitations including some steps.  June 26, 2008 returns for PFT's still prednisone 5mg  one daily plus symbicort 160, no new problems.    March 02, 2010 6 wk followup with PFT's. Pt states that her breathing is much better. rec add spriva   July 27, 2011cc overall better on spiriva then prod cough x 3 d  with green sputum in the am. rec augemnt and continue pred 5 mg every other day   October 27, 2010 ov @ 5 mg/day cc cough better, breathing better walking up to 2 miles a day on the job . >>rec pred 5mg  1/2 every other day   Stopped prednisone even the qod late Feb 2012 > no flare of symptoms or nausea/fatigue so remained off   05/03/2011 ov/ Wert no flare of cough or sob off prednisone.  No ocular or articular symptoms, no rash.  rec try off spiriva and just use the proaire.  06/05/2011 ov/Wert worse off spiriva but did not restart it,  Using proaire daily then sick x 3-4 days with green sputum and increased need for proaire for sob. Not using hfa optimally. Assoc with nasal congestion and ear pain bilaterally. No longer using ppi but denies overt HB  Pt denies any significant sore throat, dysphagia, itching, sneezing,   ,  fever, chills, sweats, unintended wt loss, pleuritic or exertional cp, hempoptysis, orthopnea pnd or leg swelling.    Also denies any obvious fluctuation of symptoms with weather or environmental changes or other aggravating or alleviating factors.       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 Marga Melnick  -Daily prednisone since 2007 with ? adrenal insufficiency iatrogenic > off completely Feb 2012 with no problem ? 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 (ICD-280.9)    Family History:  crohn`s in daughter, M-lupus, htn, asthma,  no Ca, DM, CAD   Social History:   lives with twin children and grandson. single; >20 pack year history, quit in 2005; no EtOH  works for [employer]  Risk Factors:  Smoking Status: quit (05/13/2010)          Review of Systems     Objective:   Physical Exam wt 166 October 12, 2009 >   170 October 28, 2010 >  169 05/03/11 > 165 06/05/2011  amb bf nad  HEENT mild turbinate edema. Oropharynx no thrush or excess pnd or cobblestoning. No JVD or cervical adenopathy. Mild accessory  muscle hypertrophy. Trachea midline, nl thryroid. Chest was hyperinflated by percussion with diminished breath sounds and moderate increased exp time without wheeze. Hoover sign positive at mid inspiration. Regular rate and rhythm without murmur gallop or rub or increase P2 or edema. Abd: no hsm, nl excursion. Ext warm without cyanosis or clubbing.     cxr 05/03/11 Stable areas of scarring throughout the lungs. No active disease.   Assessment:         Plan:

## 2011-06-05 NOTE — Telephone Encounter (Signed)
Called and spoke with pt. Pt c/o coughing up green sputum, ear ache, nasal congestion, increased sob, wheezing and tightness in chest.  Denies a temp.  Symptoms started approx 3 to 4 days ago.  Hasn't tried anything OTC yet.  Pt is requesting MW's recs.  Please advise. Thank you  NKDA  Pharmacy: [facility] on [address]

## 2011-06-05 NOTE — Telephone Encounter (Signed)
Pt set to see MW today at 11:30. Pt aware to bring all her meds. Carron Curie, CMA

## 2011-06-05 NOTE — Patient Instructions (Addendum)
I emphasized that nasal steroids(flonase)  have no immediate benefit in terms of improving symptoms.  To help them reach  the target tissue, the patient should use Afrin two puffs every 12 hours applied one min before using the nasal steroids.  Afrin should be stopped after no more than 5 days.  If the symptoms worsen, Afrin can be restarted after 5 days off of therapy to prevent rebound congestion from overuse of Afrin.  I also emphasized that in no way are nasal steroids a concern in terms of "addiction".   Prednisone 10 mg take  4 each am x 2 days,   2 each am x 2 days,  1 each am x2days and stop Zpack called in  Work on inhaler technique:  relax and gently blow all the way out then take a nice smooth deep breath back in, triggering the inhaler at same time you start breathing in.  Hold for up to 5 seconds if you can and out through nose.   Rinse and gargle with water when done  Leave off spiriva for now   Try prilosec 20mg   Take 30-60 min before first meal of the day and Pepcid 20 mg one bedtime until cough is completely gone for at least a week without the need for cough suppression  I think of reflux for chronic cough like I do oxygen for fire (doesn't cause the fire but once you get the oxygen suppressed it usually goes away regardless of the exact cause).   Please schedule a follow up visit in 3 months but call sooner if needed

## 2011-06-26 ENCOUNTER — Telehealth: Payer: Self-pay | Admitting: Internal Medicine

## 2011-06-26 NOTE — Telephone Encounter (Signed)
[id number] 

## 2011-06-27 NOTE — Telephone Encounter (Signed)
Pt returning call wants to know if she can be called back around 2p if possible.[name]

## 2011-06-27 NOTE — Telephone Encounter (Signed)
Pt c/o wheezing throughout the day. She is using her Proair several times during the day that gives slight relief. She also c/o chest tightness. 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 [name] as well. Pls advise.

## 2011-06-27 NOTE — Telephone Encounter (Signed)
Spoke with pt and advised if she saw another doctor and they gave her instructions she should follow them until she sees TP. Pt staets understanding. Carron Curie, CMA

## 2011-06-27 NOTE — Telephone Encounter (Signed)
LMOMTCB x 1 

## 2011-06-27 NOTE — Telephone Encounter (Signed)
Ov with all meds /inhalers/neb solutions etc in hand either me or Tammy NP ASAP

## 2011-06-27 NOTE — Telephone Encounter (Signed)
PT RETURNED CALL. I HAVE MADE HER AN APPT FOR THIS THURS W/ MW. I ALSO ADVISED HER TO BRING ALL MEDS/ INHALERS/ NASAL SPRAYS, ETC. AT TIME OF OV. PT SAYS THAT HER NP "AT HER WORKPLACE" SAW HER TODAY AND GAVE HER A RX FOR PREDNISONE. PT WANTS RECS RE: IF SHE SHOULD TAKE THIS "IN THE MEANTIME" BEFORE SEEING DR. [name] Thursday. Hazel Sams

## 2011-06-28 NOTE — Progress Notes (Signed)
Subjective:     Patient ID: [name], female   DOB: 08-08-1961, 50 y.o.   MRN: [id number]  HPI  62 yobf quit smoking 10/2004 dx by transbronchial biopsy 5/92 with NCG inflammatory change consistent with sarcoid. Since that time she's been off and on prednisone multiple  times with symptoms of coughing dyspnea and skin involvement.    Overall she's been doing much better since she stopped smoking 10/2004 and much better since change to Symbicort 160/4.5 2 two times a day, able to do adl's ok without limitations including some steps.  June 26, 2008 returns for PFT's still prednisone 5mg  one daily plus symbicort 160, no new problems > no change rx    March 02, 2010 6 wk followup with PFT's. Pt states that her breathing is much better. rec add spriva trial.   October 27, 2010 ov @ 5 mg/day cc cough better, breathing better walking up to 2 miles a day on the job . >>rec pred 5mg  1/2 every other day   Stopped prednisone even the qod late Feb 2012 > no flare of symptoms or nausea/fatigue so remained off   05/03/2011 ov/ [name] no flare of cough or sob off prednisone.  No ocular or articular symptoms, no rash.  rec try off spiriva and just use the proaire prn.  06/05/2011 ov/[name] worse off spiriva but did not restart it,  Using proaire daily then sick x 3-4 days with green sputum and increased need for proaire for sob. Not using hfa optimally. Assoc with nasal congestion and ear pain bilaterally. No longer using ppi but denies overt HB  rec approp use of nasal steroids reviewed Prednisone 10 mg take  4 each am x 2 days,   2 each am x 2 days,  1 each am x2days and stop Zpack called in Work on inhaler technique:   Leave off spiriva for now  Try prilosec 20mg   Take 30-60 min before first meal of the day and Pepcid 20 mg one bedtime  06/29/2011 f/u ov/[name] cc variable sob esp in am but no longer green sputum, sinuses feel clear.  Back on prednisone since 8/15 for predominantly rhinitis flare. No  more cough and overall back to baseline x occ sob not reproducible with exercise.  Sleeping ok without nocturnal  or early am exac of resp c/o's or need for noct saba. Pt denies any significant sore throat, dysphagia, itching, sneezing,  nasal congestion or excess/ purulent secretions,  fever, chills, sweats, unintended wt loss, pleuritic or exertional cp, hempoptysis, orthopnea pnd or leg swelling.    Also denies any obvious fluctuation of symptoms with weather or environmental changes or other aggravating or alleviating factors.       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 Marga Melnick  -h/o Daily prednisone since 2007   > off completely Feb 2012 with no problem ? 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 (ICD-280.9)  Health Maintenance.......................................................   [facility]  Family History:  crohn`s in daughter, M-lupus, htn, asthma,  no Ca, DM, CAD   Social History:   lives with twin children and grandson. single; >20 pack year history, quit in 2005; no EtOH  works for [employer]           Review of Systems     Objective:   Physical Exam wt 166 October 12, 2009 >   170 October 28, 2010 >  169 05/03/11 > 165 06/05/2011 >  169 06/29/2011  amb bf nad  HEENT mild turbinate edema. Oropharynx no thrush or excess pnd or cobblestoning. No JVD or  cervical adenopathy. Mild accessory muscle hypertrophy. Trachea midline, nl thryroid. Chest was hyperinflated by percussion with diminished breath sounds and moderate increased exp time without wheeze. Hoover sign positive at mid inspiration. Regular rate and rhythm without murmur gallop or rub or increase P2 or edema. Abd: no hsm, nl excursion. Ext warm without cyanosis or clubbing.     cxr 05/03/11 Stable areas of scarring throughout the lungs. No active disease.   Assessment:         Plan:

## 2011-06-29 ENCOUNTER — Encounter: Payer: Self-pay | Admitting: *Deleted

## 2011-06-29 ENCOUNTER — Other Ambulatory Visit: Payer: Self-pay | Admitting: Family Medicine

## 2011-06-29 ENCOUNTER — Ambulatory Visit (INDEPENDENT_AMBULATORY_CARE_PROVIDER_SITE_OTHER): Payer: BC Managed Care – PPO | Admitting: Internal Medicine

## 2011-06-29 ENCOUNTER — Encounter: Payer: Self-pay | Admitting: Internal Medicine

## 2011-06-29 ENCOUNTER — Ambulatory Visit: Payer: BC Managed Care – PPO | Admitting: Adult Health

## 2011-06-29 VITALS — BP 146/90 | HR 76 | Temp 98.2°F | Ht 65.0 in | Wt 169.4 lb

## 2011-06-29 DIAGNOSIS — D869 Sarcoidosis, unspecified: Secondary | ICD-10-CM

## 2011-06-29 NOTE — Patient Instructions (Signed)
Pepcid 20 mg one at bedtime  GERD (REFLUX)  is an extremely common cause of respiratory symptoms, many times with no significant heartburn at all.    It can be treated with medication, but also with lifestyle changes including avoidance of late meals, excessive alcohol, smoking cessation, and avoid fatty foods, chocolate, peppermint, colas, red wine, and acidic juices such as orange juice.  NO MINT OR MENTHOL PRODUCTS SO NO COUGH DROPS  USE SUGARLESS CANDY INSTEAD (jolley ranchers or Stover's)  NO OIL BASED VITAMINS (no fish oil)  Please schedule a follow up office visit in 4 weeks, sooner if needed with PFT's

## 2011-06-29 NOTE — Telephone Encounter (Signed)
Refill request

## 2011-06-29 NOTE — Assessment & Plan Note (Signed)
The goal with a chronic steroid dependent illness is always arriving at the lowest effective dose that controls the disease/symptoms and not accepting a set "formula" which is based on statistics or guidelines that don't always take into account patient  variability or the natural hx of the dz in every individual patient, which may well vary over time.  For now therefore I recommend the patient maintain  Off chronic prednisone  DDX of  difficult airways managment all start with A and  include Adherence, Ace Inhibitors, Acid Reflux, Active Sinus Disease, Alpha 1 Antitripsin deficiency, Anxiety masquerading as Airways dz,  ABPA,  allergy(esp in young), Aspiration (esp in elderly), Adverse effects of DPI,  Active smokers, plus two Bs  = Bronchiectasis and Beta blocker use..and one C= CHF   Adherence is always the initial "prime suspect" and is a multilayered concern that requires a "trust but verify" approach in every patient - starting with knowing how to use medications, especially inhalers, correctly, keeping up with refills and understanding the fundamental difference between maintenance and prns vs those medications only taken for a very short course and then stopped and not refilled. The proper method of use, as well as anticipated side effects, of this metered-dose inhaler are discussed and demonstrated to the patient. Improved to 75% with coaching.  ? Acid reflux > reviewed diet and hs h2  ? Active sinus dz > sinus ct if flares off prednisone

## 2011-07-05 ENCOUNTER — Encounter: Payer: Self-pay | Admitting: Family Medicine

## 2011-07-05 ENCOUNTER — Ambulatory Visit (INDEPENDENT_AMBULATORY_CARE_PROVIDER_SITE_OTHER): Payer: BC Managed Care – PPO | Admitting: Family Medicine

## 2011-07-05 VITALS — BP 126/80 | HR 73 | Ht 65.0 in | Wt 169.0 lb

## 2011-07-05 DIAGNOSIS — A630 Anogenital (venereal) warts: Secondary | ICD-10-CM | POA: Insufficient documentation

## 2011-07-05 DIAGNOSIS — D869 Sarcoidosis, unspecified: Secondary | ICD-10-CM

## 2011-07-05 DIAGNOSIS — N760 Acute vaginitis: Secondary | ICD-10-CM | POA: Insufficient documentation

## 2011-07-05 DIAGNOSIS — I10 Essential (primary) hypertension: Secondary | ICD-10-CM

## 2011-07-05 LAB — POCT WET PREP (WET MOUNT): Yeast Wet Prep HPF POC: NEGATIVE

## 2011-07-05 MED ORDER — IMIQUIMOD 5 % EX CREA: TOPICAL_CREAM | CUTANEOUS | Status: DC

## 2011-07-05 MED ORDER — METRONIDAZOLE 500 MG PO TABS: 500.0000 mg | ORAL_TABLET | Freq: Two times a day (BID) | ORAL | Status: AC

## 2011-07-05 NOTE — Assessment & Plan Note (Signed)
Positive for BV. Will treat with flagyl. Follow up prn.

## 2011-07-05 NOTE — Assessment & Plan Note (Signed)
At goal today without medication. Patient checks daily and taking HCTZ prn only. Will not make changes as her BP is typically at goal and hypotension causes side effects.

## 2011-07-05 NOTE — Assessment & Plan Note (Signed)
First outbreak according to patient. Possibly a complication of immunosuppressant therapy with prednisone. No recent sexual contacts (>1 year). Discussed treatment options being podophyllin or cryo in office. Will start with topical aldara three times weekly. Follow up in 3-4 weeks if not improved.

## 2011-07-05 NOTE — Assessment & Plan Note (Signed)
Off steroids and immunosuppression currently. No signs of exacerbation. Continue routine follow up per pulmonology.

## 2011-07-05 NOTE — Patient Instructions (Signed)
Nice to meet you. I will call your work with results tomorrow. Try aldara on your warts. Call for another appointment in 3 weeks if not improved. Your blood pressure is good today. Goal is <140/90.   Genital Warts Genital warts are caused by a virus called Human Papilloma Virus (HPV). HPV is the most common sexually transmitted disease, STD, and infection of the sex organs. There are about 100 different types of the HPV's and 4 of these can cause cancer of the cervix. Ninety percent of genital warts are caused by the HPV.  CAUSES This infection is spread by unprotected sex with an infected person. It can be spread by vaginal, anal, and oral sex. Many people will not know they are infected. They may be infected for years with little or no problems (symptoms). They can still unknowingly pass the infection to sexual partners. The most serious problem is that certain types of HPV is associated with cervical cancer but not genital warts. You are more likely to have HPV if you have other sexually transmitted disease. SYMPTOMS  Itching and irritation in the genital area.   Warts that bleed.   Painful sexual intercourse due to warts.  DIAGNOSIS  Warts are usually recognized with the naked eye in the vagina, on the vulva, the perineum, the anus and in the rectum.   With a Pap test.   With a biopsy.   With colposcopy (an instrument that magnifies the HPV cells that change color with certain solutions).  TREATMENT Warts can be removed by applying certain chemicals to the warts such as:  Podophllin.   Bichloroacedic acid.   Trichloroacedic acid.  Other treatments include:  Interferon injections.   Freezing (cryotherapy).   Laser treatment.   Removal of warts by burning them with electrified probe (electrocautery).   Surgery.  PREVENTION HPV vaccination can help prevent HPV infections that cause genital warts and that cause cancer of the cervix. It is recommended that the vaccination  be given to people between the ages 54 to 28 years old. The vaccine might not work as well or might not work at all if you already have HPV. It should not be given to pregnant women. HOME CARE INSTRUCTIONS  It is important to follow your caregiver's instructions. The warts will not go away without treatment. Repeat treatments often are needed to get rid of the warts. Even after it appears that the warts are gone, the normal tissue underneath often remains infected.   DO NOT try to treat genital warts with medicine used to treat hand warts. This type of medicine is strong and can burn the skin in the genital area, causing more damage.   WARNING: This infection is contagious. Tell your sexual partner(s) with whom you had sex before treatment that you have genital warts. They may be infected also and need treatment.   WARNING: Avoid sexual contact while being treated. Following treatment the use of condoms will help prevent reinfection. This virus is hard to wipe out. You may always be contagious.   DO NOT touch or scratch the warts. This is a viral infection and may spread to other parts of your body (auto-inoculation).   Women with genital warts should have a cervical cancer check (pap smear) at least once a year. This type of cancer is slow-growing and can be cured if found early. Chances of developing cervical cancer are increased with HPV.   Inform your obstetrician in the event of pregnancy. This virus can be passed  to the baby's respiratory tract. Discuss this with your caregiver.   Use a condom during sexual intercourse.   There are over-the-counter anti-itch creams you can use for itching, ask your caregiver before using it.   Ask your caregiver about HPV vaccination.   Have only one sex partner. Make sure your partner has only one sex partner.  SEEK MEDICAL CARE IF:  The treated skin becomes red, swollen, or painful.   An oral temperature above 101 or higher develops.   You feel  generally ill.   You feel little lumps (pimple-like) in and around your genital area.   You are bleeding or have painful sexual intercourse.  MAKE SURE YOU:   Understand these instructions.   Will watch your condition.   Will get help right away if you are not doing well or get worse.  Document Released: 10/27/2000 Document Re-Released: 04/19/2010 [facility] Patient Information 2011 [place], [place].

## 2011-07-05 NOTE — Progress Notes (Signed)
  Subjective:    Patient ID: [name], female    DOB: 15-May-1961, 50 y.o.   MRN: [id number]  HPI  1. Rectal irritation. Noticed some irritation and itching in rectal area soon after she finished a prednisone treatment. Has gotten vaginal candidiasis before and thinks this feels similar, but symptoms more in anal area. She feels bumps around the rectum while wiping. No pain with defecation.  Mild vaginal discharge. Denies pain or abnormal bleeding from rectum. Last sexual intercourse was over one year ago. Has not used any medication or ointments. Denies any history of STD. Had a hysterectomy for fibroids last year.  2. HTN. Checks BP at home q1-2 days. ONly takes HCTZ if BP is >140/90. Normally this value is in 120s/80s. Lowest has been 100s systolic, and she felt dizzy.   3. Sarcoid. Recently put on steroid taper for some sinus problems. Finished this course several days ago and no longer taking prednisone.  Review of Systems See HPI. Denies weight changes, fevers, chills, abnormal bleeding, new sexual contact, dysuria, vaginal pain.     Objective:   Physical Exam  Constitutional: She is oriented to person, place, and time. She appears well-developed and well-nourished. No distress.  HENT:  Head: Normocephalic and atraumatic.  Pulmonary/Chest: Effort normal. No respiratory distress.  Genitourinary: Vaginal discharge found.       Thick white vaginal discharge. Mucosa normal. Several peri-anal verrucous lesions. No ulcers or skin breakdown.  Anoscope exam reveal normal mucosa, no internal lesions or fissure.   Neurological: She is alert and oriented to person, place, and time. No cranial nerve deficit.          Assessment & Plan:

## 2011-08-01 ENCOUNTER — Ambulatory Visit (INDEPENDENT_AMBULATORY_CARE_PROVIDER_SITE_OTHER): Payer: BC Managed Care – PPO | Admitting: Internal Medicine

## 2011-08-01 ENCOUNTER — Encounter: Payer: Self-pay | Admitting: *Deleted

## 2011-08-01 DIAGNOSIS — D869 Sarcoidosis, unspecified: Secondary | ICD-10-CM

## 2011-08-01 DIAGNOSIS — J4489 Other specified chronic obstructive pulmonary disease: Secondary | ICD-10-CM

## 2011-08-01 DIAGNOSIS — J449 Chronic obstructive pulmonary disease, unspecified: Secondary | ICD-10-CM

## 2011-08-01 LAB — PULMONARY FUNCTION TEST

## 2011-08-01 MED ORDER — BUDESONIDE-FORMOTEROL FUMARATE 160-4.5 MCG/ACT IN AERO: 2.0000 | INHALATION_SPRAY | Freq: Two times a day (BID) | RESPIRATORY_TRACT | Status: DC

## 2011-08-01 NOTE — Progress Notes (Signed)
Subjective:     Patient ID: [name], female   DOB: 12-Oct-1961, 50 y.o.   MRN: [id number]  HPI  34 yobf quit smoking 10/2004 dx by transbronchial biopsy 5/92 with NCG inflammatory change consistent with sarcoid. Since that time she's been off and on prednisone multiple  times with symptoms of coughing dyspnea and skin involvement.    Overall she's been doing much better since she stopped smoking 10/2004 and much better since change to Symbicort 160/4.5 2 two times a day, able to do adl's ok without limitations including some steps.     June 26, 2008 returns for PFT's still prednisone 5mg  one daily plus symbicort 160, no new problems > no change rx    March 02, 2010 6 wk followup with PFT's. Pt states that her breathing is much better. rec add spriva trial.   October 27, 2010 ov @ 5 mg/day cc cough better, breathing better walking up to 2 miles a day on the job . >>rec pred 5mg  1/2 every other day   Stopped prednisone even the qod late Feb 2012 > no flare of symptoms or nausea/fatigue so remained off   05/03/2011 ov/ Arnet Hofferber no flare of cough or sob off prednisone.  No ocular or articular symptoms, no rash.  rec try off spiriva and just use the proaire prn.  06/05/2011 ov/Kenard Morawski worse off spiriva but did not restart it,  Using proaire daily then sick x 3-4 days with green sputum and increased need for proaire for sob. Not using hfa optimally. Assoc with nasal congestion and ear pain bilaterally. No longer using ppi but denies overt HB  rec approp use of nasal steroids reviewed Prednisone 10 mg take  4 each am x 2 days,   2 each am x 2 days,  1 each am x2days and stop Zpack called in Work on inhaler technique:   Leave off spiriva for now  Try prilosec 20mg   Take 30-60 min before first meal of the day and Pepcid 20 mg one bedtime  06/29/2011 f/u ov/Sugey Trevathan cc variable sob esp in am but no longer green sputum, sinuses feel clear.  Back on prednisone since 8/15 for predominantly rhinitis flare.  No more cough and overall back to baseline x occ sob not reproducible with exercise. rec Pepcid 20 mg one at bedtime GERD diet     08/01/2011 f/u ov/Djeneba Barsch cc breathing the best it's been in a long time. No limitations or cough or daytime saba rescue  Sleeping ok without nocturnal  or early am exac of resp c/o's or need for noct saba. Pt denies any significant sore throat, dysphagia, itching, sneezing,  nasal congestion or excess/ purulent secretions,  fever, chills, sweats, unintended wt loss, pleuritic or exertional cp, hempoptysis, orthopnea pnd or leg swelling.    Also denies any obvious fluctuation of symptoms with weather or environmental changes or other aggravating or alleviating factors.       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 Marga Melnick  -h/o Daily prednisone since 2007   > off completely Feb 2012 with no problem ? 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 (ICD-280.9)  Health Maintenance.......................................................   [facility]   Family History:  crohn`s in daughter, M-lupus, htn, asthma,  no Ca, DM, CAD   Social History:   lives with twin children and grandson. single; >20 pack year history, quit in 2005; no EtOH  works for [employer]               Objective:   Physical  Exam wt 166 October 12, 2009 >   170 October 28, 2010 > 169 06/29/2011 > 08/01/2011 168 amb bf nad  HEENT mild turbinate edema. Oropharynx no thrush or excess pnd or cobblestoning. No JVD or cervical adenopathy. Mild accessory muscle hypertrophy. Trachea midline, nl thryroid. Chest was hyperinflated by percussion with diminished breath sounds and moderate increased exp time without wheeze. Hoover sign positive at mid inspiration. Regular rate and rhythm without murmur gallop or rub or increase P2 or edema. Abd: no hsm, nl excursion. Ext warm without cyanosis or clubbing.     cxr 05/03/11 Stable areas of scarring throughout the lungs. No active disease.   Assessment:         Plan:

## 2011-08-01 NOTE — Progress Notes (Signed)
PFT done today. 

## 2011-08-01 NOTE — Patient Instructions (Signed)
Please schedule a follow up visit in 6 months but call sooner if needed  

## 2011-08-03 ENCOUNTER — Encounter: Payer: Self-pay | Admitting: Internal Medicine

## 2011-08-03 NOTE — Assessment & Plan Note (Signed)
Adequate control on present rx, reviewed  

## 2011-08-03 NOTE — Assessment & Plan Note (Addendum)
The goal with a chronic steroid dependent illness is always arriving at the lowest effective dose that controls the disease/symptoms and not accepting a set "formula" which is based on statistics or guidelines that don't always take into account patient  variability or the natural hx of the dz in every individual patient, which may well vary over time.  For now therefore I recommend the patient maintain  A floor of "Zero" systemic steroids while maintaining on an optimal topical rx in the form of symbicort 160 2bid     Each maintenance medication was reviewed in detail including most importantly the difference between maintenance and as needed and under what circumstances the prns are to be used.  Please see instructions for details which were reviewed in writing and the patient given a copy.

## 2011-08-04 LAB — COMPREHENSIVE METABOLIC PANEL
ALT: 10
AST: 20
Albumin: 2.8 — ABNORMAL LOW
Alkaline Phosphatase: 62
BUN: 7
CO2: 29
Calcium: 9
Chloride: 102
Creatinine, Ser: 0.71
GFR calc Af Amer: >60
GFR calc non Af Amer: >60
Glucose, Bld: 81
Potassium: 3.3 — ABNORMAL LOW
Sodium: 139
Total Bilirubin: 0.5
Total Protein: 7.1

## 2011-08-04 LAB — URINALYSIS, ROUTINE W REFLEX MICROSCOPIC
Hgb urine dipstick: NEGATIVE
Specific Gravity, Urine: 1.014
Urobilinogen, UA: 0.2
pH: 6.5

## 2011-08-04 LAB — DIFFERENTIAL
Basophils Relative: 0
Eosinophils Absolute: 0.2
Monocytes Absolute: 0.8
Monocytes Relative: 8

## 2011-08-04 LAB — CBC
Hemoglobin: 11.7 — ABNORMAL LOW
Platelets: 436 — ABNORMAL HIGH
RDW: 18 — ABNORMAL HIGH
WBC: 10

## 2011-08-04 LAB — POCT PREGNANCY, URINE
Operator id: 198171
Preg Test, Ur: NEGATIVE

## 2011-08-14 ENCOUNTER — Encounter: Payer: Self-pay | Admitting: Internal Medicine

## 2011-08-24 ENCOUNTER — Encounter: Payer: Self-pay | Admitting: Adult Health

## 2011-08-24 ENCOUNTER — Encounter: Payer: Self-pay | Admitting: *Deleted

## 2011-08-24 ENCOUNTER — Ambulatory Visit (INDEPENDENT_AMBULATORY_CARE_PROVIDER_SITE_OTHER): Payer: BC Managed Care – PPO | Admitting: Adult Health

## 2011-08-24 ENCOUNTER — Telehealth: Payer: Self-pay | Admitting: Internal Medicine

## 2011-08-24 DIAGNOSIS — J449 Chronic obstructive pulmonary disease, unspecified: Secondary | ICD-10-CM

## 2011-08-24 DIAGNOSIS — J4489 Other specified chronic obstructive pulmonary disease: Secondary | ICD-10-CM

## 2011-08-24 MED ORDER — PREDNISONE 10 MG PO TABS: ORAL_TABLET | ORAL | Status: AC

## 2011-08-24 NOTE — Patient Instructions (Signed)
Remain on Symbicort regularly everyday w/ no missed doses.  Prednisone taper over next week.  Please contact office for sooner follow up if symptoms do not improve or worsen or seek emergency care  follow up Dr. [name]  In 6 weeks

## 2011-08-24 NOTE — Assessment & Plan Note (Signed)
Mild flare   Plan;  Remain on Symbicort regularly everyday w/ no missed doses.  Prednisone taper over next week.  Please contact office for sooner follow up if symptoms do not improve or worsen or seek emergency care  follow up Dr. [name]  In 6 weeks

## 2011-08-24 NOTE — Progress Notes (Signed)
Subjective:     Patient ID: [name], female   DOB: 02-10-1961, 50 y.o.   MRN: [id number]  HPI 25 yobf quit smoking 10/2004 dx by transbronchial biopsy 5/92 with NCG inflammatory change consistent with sarcoid. Since that time she's been off and on prednisone multiple  times with symptoms of coughing dyspnea and skin involvement.    Overall she's been doing much better since she stopped smoking 10/2004 and much better since change to Symbicort 160/4.5 2 two times a day, able to do adl's ok without limitations including some steps.     June 26, 2008 returns for PFT's still prednisone 5mg  one daily plus symbicort 160, no new problems > no change rx    March 02, 2010 6 wk followup with PFT's. Pt states that her breathing is much better. rec add spriva trial.   October 27, 2010 ov @ 5 mg/day cc cough better, breathing better walking up to 2 miles a day on the job . >>rec pred 5mg  1/2 every other day   Stopped prednisone even the qod late Feb 2012 > no flare of symptoms or nausea/fatigue so remained off   05/03/2011 ov/ Wert no flare of cough or sob off prednisone.  No ocular or articular symptoms, no rash.  rec try off spiriva and just use the proaire prn.  06/05/2011 ov/Wert worse off spiriva but did not restart it,  Using proaire daily then sick x 3-4 days with green sputum and increased need for proaire for sob. Not using hfa optimally. Assoc with nasal congestion and ear pain bilaterally. No longer using ppi but denies overt HB  rec approp use of nasal steroids reviewed Prednisone 10 mg take  4 each am x 2 days,   2 each am x 2 days,  1 each am x2days and stop Zpack called in Work on inhaler technique:   Leave off spiriva for now  Try prilosec 20mg   Take 30-60 min before first meal of the day and Pepcid 20 mg one bedtime  06/29/2011 f/u ov/Wert cc variable sob esp in am but no longer green sputum, sinuses feel clear.  Back on prednisone since 8/15 for predominantly rhinitis flare. No  more cough and overall back to baseline x occ sob not reproducible with exercise. rec Pepcid 20 mg one at bedtime GERD diet     08/01/2011 f/u ov/Wert cc breathing the best it's been in a long time. No limitations or cough or daytime saba rescue >no changes   08/24/2011 Acute OV  PT complains of wheezing x 2 days.  sob with activity x 2 days--no cough but some sneezing. Some drainage at times. No fever or discolored mucus. Does not take symbicort on regular basis.  No otc used. No chest pain or edema.       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 Marga Melnick  -h/o Daily prednisone since 2007   > off completely Feb 2012 with no problem ? 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 (ICD-280.9)  Health Maintenance.......................................................   [facility]   Family History:  crohn`s in daughter, M-lupus, htn, asthma,  no Ca, DM, CAD   Social History:   lives with twin children and grandson. single; >20 pack year history, quit in 2005; no EtOH  works for [employer]               Objective:   Physical Exam wt 166 October 12, 2009 >   170 October 28, 2010 > 169 06/29/2011 > 08/01/2011 168 >>171 08/24/2011  amb bf nad  HEENT mild turbinate edema. Oropharynx no thrush or excess pnd or  cobblestoning. No JVD or cervical adenopathy. Mild accessory muscle hypertrophy. Trachea midline, nl thryroid. Lungs Coarse BS w/ faint exp wheeze . Regular rate and rhythm without murmur gallop or rub or increase P2 or edema. Abd: no hsm, nl excursion. Ext warm without cyanosis or clubbing.     cxr 05/03/11 Stable areas of scarring throughout the lungs. No active disease.   Assessment:         Plan:

## 2011-08-24 NOTE — Telephone Encounter (Signed)
Spoke with pt. She is c/o increased SOB and wheezing since weather change. OV with TP at 3:45 today

## 2011-08-25 LAB — CBC
HCT: 36.2
Hemoglobin: 11.9 — ABNORMAL LOW
MCV: 72.6 — ABNORMAL LOW
RBC: 4.98
WBC: 7.4

## 2011-08-25 LAB — POCT CARDIAC MARKERS: Myoglobin, poc: 94.7

## 2011-08-25 LAB — DIFFERENTIAL
Eosinophils Absolute: 0.1
Eosinophils Relative: 2
Lymphs Abs: 1.1
Monocytes Relative: 13 — ABNORMAL HIGH

## 2011-08-25 LAB — I-STAT 8, (EC8 V) (CONVERTED LAB)
Acid-Base Excess: 13 — ABNORMAL HIGH
Potassium: 3.7
TCO2: 39
pH, Ven: 7.539 — ABNORMAL HIGH

## 2011-08-25 LAB — POCT I-STAT CREATININE
Creatinine, Ser: 0.9
Operator id: 270651

## 2011-10-09 ENCOUNTER — Ambulatory Visit: Payer: BC Managed Care – PPO | Admitting: Internal Medicine

## 2012-01-02 ENCOUNTER — Other Ambulatory Visit: Payer: Self-pay | Admitting: Internal Medicine

## 2012-01-03 ENCOUNTER — Telehealth: Payer: Self-pay | Admitting: Internal Medicine

## 2012-01-03 MED ORDER — FLUTICASONE PROPIONATE 50 MCG/ACT NA SUSP: NASAL | Status: DC

## 2012-01-03 NOTE — Telephone Encounter (Signed)
Left message that Flonase Rx has been sent to pharmacy but pt needs to KEEP appt with MW for refills.

## 2012-01-08 ENCOUNTER — Encounter: Payer: Self-pay | Admitting: Sports Medicine

## 2012-01-08 ENCOUNTER — Ambulatory Visit (INDEPENDENT_AMBULATORY_CARE_PROVIDER_SITE_OTHER): Payer: BC Managed Care – PPO | Admitting: Sports Medicine

## 2012-01-08 VITALS — BP 120/78 | Temp 98.5°F | Ht 65.0 in | Wt 172.5 lb

## 2012-01-08 DIAGNOSIS — D573 Sickle-cell trait: Secondary | ICD-10-CM

## 2012-01-08 LAB — CBC
HCT: 37.7 % (ref 36.0–46.0)
MCH: 27 pg (ref 26.0–34.0)
MCHC: 33.2 g/dL (ref 30.0–36.0)
MCV: 81.4 fL (ref 78.0–100.0)
RDW: 15 % (ref 11.5–15.5)

## 2012-01-08 NOTE — Patient Instructions (Signed)
It was nice to meet you today.  You have viral upper respiratory infection.  At this point your body has everything it needs to fight off this infection.  You can continue to drink warm tea with honey, use other the counter ibuprofen, benadryl, and/or Sudafed for symptomatic relief.   I will check your hemoglobin and white blood cell count today due to your dizziness.  Please follow up as previously scheduled unless you have any issues as described as below.  Please let us know if there is anything else we can do.    If you are not better or your breathing worsens please contact us to be seen.  Upper Respiratory Infection, Adult An upper respiratory infection (URI) is also sometimes known as the common cold. The upper respiratory tract includes the nose, sinuses, throat, trachea, and bronchi. Bronchi are the airways leading to the lungs. Most people improve within 1 week, but symptoms can last up to 2 weeks. A residual cough may last even longer.   CAUSES Many different viruses can infect the tissues lining the upper respiratory tract. The tissues become irritated and inflamed and often become very moist. Mucus production is also common. A cold is contagious. You can easily spread the virus to others by oral contact. This includes kissing, sharing a glass, coughing, or sneezing. Touching your mouth or nose and then touching a surface, which is then touched by another person, can also spread the virus. SYMPTOMS   Symptoms typically develop 1 to 3 days after you come in contact with a cold virus. Symptoms vary from person to person. They may include:  Runny nose.     Sneezing.    Nasal congestion.     Sinus irritation.     Sore throat.     Loss of voice (laryngitis).     Cough.    Fatigue.    Muscle aches.     Loss of appetite.     Headache.    Low-grade fever.  DIAGNOSIS   You might diagnose your own cold based on familiar symptoms, since most people get a cold 2 to 3 times a  year. Your caregiver can confirm this based on your exam. Most importantly, your caregiver can check that your symptoms are not due to another disease such as strep throat, sinusitis, pneumonia, asthma, or epiglottitis. Blood tests, throat tests, and X-rays are not necessary to diagnose a common cold, but they may sometimes be helpful in excluding other more serious diseases. Your caregiver will decide if any further tests are required. RISKS AND COMPLICATIONS   You may be at risk for a more severe case of the common cold if you smoke cigarettes, have chronic heart disease (such as heart failure) or lung disease (such as asthma), or if you have a weakened immune system. The very young and very old are also at risk for more serious infections. Bacterial sinusitis, middle ear infections, and bacterial pneumonia can complicate the common cold. The common cold can worsen asthma and chronic obstructive pulmonary disease (COPD). Sometimes, these complications can require emergency medical care and may be life-threatening. PREVENTION   The best way to protect against getting a cold is to practice good hygiene. Avoid oral or hand contact with people with cold symptoms. Wash your hands often if contact occurs. There is no clear evidence that vitamin C, vitamin E, echinacea, or exercise reduces the chance of developing a cold. However, it is always recommended to get plenty of rest and practice  good nutrition. TREATMENT   Treatment is directed at relieving symptoms. There is no cure. Antibiotics are not effective, because the infection is caused by a virus, not by bacteria. Treatment may include:  Increased fluid intake. Sports drinks offer valuable electrolytes, sugars, and fluids.     Breathing heated mist or steam (vaporizer or shower).     Eating chicken soup or other clear broths, and maintaining good nutrition.     Getting plenty of rest.     Using gargles or lozenges for comfort.     Controlling  fevers with ibuprofen or acetaminophen as directed by your caregiver.     Increasing usage of your inhaler if you have asthma.  Zinc gel and zinc lozenges, taken in the first 24 hours of the common cold, can shorten the duration and lessen the severity of symptoms. Pain medicines may help with fever, muscle aches, and throat pain. A variety of non-prescription medicines are available to treat congestion and runny nose. Your caregiver can make recommendations and may suggest nasal or lung inhalers for other symptoms.   HOME CARE INSTRUCTIONS    Only take over-the-counter or prescription medicines for pain, discomfort, or fever as directed by your caregiver.     Use a warm mist humidifier or inhale steam from a shower to increase air moisture. This may keep secretions moist and make it easier to breathe.     Drink enough water and fluids to keep your urine clear or pale yellow.     Rest as needed.     Return to work when your temperature has returned to normal or as your caregiver advises. You may need to stay home longer to avoid infecting others. You can also use a face mask and careful hand washing to prevent spread of the virus.  SEEK MEDICAL CARE IF:    After the first few days, you feel you are getting worse rather than better.     You need your caregiver's advice about medicines to control symptoms.     You develop chills, worsening shortness of breath, or brown or red sputum. These may be signs of pneumonia.     You develop yellow or brown nasal discharge or pain in the face, especially when you bend forward. These may be signs of sinusitis.     You develop a fever, swollen neck glands, pain with swallowing, or white areas in the back of your throat. These may be signs of strep throat.  SEEK IMMEDIATE MEDICAL CARE IF:    You have a fever.     You develop severe or persistent headache, ear pain, sinus pain, or chest pain.     You develop wheezing, a prolonged cough, cough up  blood, or have a change in your usual mucus (if you have chronic lung disease).     You develop sore muscles or a stiff neck.  Document Released: 04/25/2001 Document Revised: 07/12/2011 Document Reviewed: 03/03/2011 [facility] Patient Information 2012 [place], [place].

## 2012-01-09 ENCOUNTER — Encounter: Payer: Self-pay | Admitting: Sports Medicine

## 2012-01-12 ENCOUNTER — Telehealth: Payer: Self-pay | Admitting: Internal Medicine

## 2012-01-12 MED ORDER — PREDNISONE 10 MG PO TABS: ORAL_TABLET | ORAL | Status: DC

## 2012-01-12 MED ORDER — AZITHROMYCIN 250 MG PO TABS: ORAL_TABLET | ORAL | Status: AC

## 2012-01-12 NOTE — Telephone Encounter (Signed)
Called, spoke with pt.  She c/o PND, chest congestion, cough, increased SOB, wheezing, slight chest tightness, and difficultly sleeping.  Symptoms started on Saturday with a sore throat which has now resolved.  States cough is prod with yellow mucus but it is very difficult to get mucus up.  She was seen by PCP, Dr. Berline Chough, on Monday and was told she had a viral resp infection.  Wasn't given any meds.  She doesn't feel any better and believes cough is worse.  She is using delsym cough drops with only some relief and started using albuterol inhaler yesterday which is helping.  There are no openings in office today -- pt would like something called in if possible d/t the weekend.  She did schedule f/u with Dr. [name] on Monday at 10:45 am and aware she will need to keep this.  Advised I will ask Dr. [name].    [facility]da - verified [facility] [address]

## 2012-01-12 NOTE — Telephone Encounter (Signed)
zpack Prednisone 10 mg take  4 each am x 2 days,   2 each am x 2 days,  1 each am x2days and stop  mucinex dm otc Can use albuterol up to every 2 hours but just over the weekend and come in 3/4 to regroup

## 2012-01-12 NOTE — Telephone Encounter (Signed)
Called spoke with patient, advised of MW's recs.  Pt okay with these recommendations and verbalized her understanding.  Pt will keep 3.4.13 appt with MW.  rx's sent to verified pharmacy.

## 2012-01-15 ENCOUNTER — Encounter: Payer: Self-pay | Admitting: Internal Medicine

## 2012-01-15 ENCOUNTER — Ambulatory Visit (INDEPENDENT_AMBULATORY_CARE_PROVIDER_SITE_OTHER): Payer: BC Managed Care – PPO | Admitting: Internal Medicine

## 2012-01-15 ENCOUNTER — Encounter: Payer: Self-pay | Admitting: Allergy

## 2012-01-15 DIAGNOSIS — D869 Sarcoidosis, unspecified: Secondary | ICD-10-CM

## 2012-01-15 DIAGNOSIS — J449 Chronic obstructive pulmonary disease, unspecified: Secondary | ICD-10-CM

## 2012-01-15 DIAGNOSIS — J4489 Other specified chronic obstructive pulmonary disease: Secondary | ICD-10-CM

## 2012-01-15 NOTE — Assessment & Plan Note (Addendum)
-   PFT's 06/26/08 FEV1 1.09 ratio  - PFTs 03/02/10 FEV1 1.06 ratio 34    PFT's 08/01/2011 FEV1 1.6 (42%)  41 ratio  DLCO 55 corrects 77 - HFA 75% 01/15/2012  - Spiriva trial March 02, 2010 > better but not worse off it 04/2011  GOLD III with aecopd so by definition difficult to control and high risk  DDX of  difficult airways managment all start with A and  include Adherence, Ace Inhibitors, Acid Reflux, Active Sinus Disease, Alpha 1 Antitripsin deficiency, Anxiety masquerading as Airways dz,  ABPA,  allergy(esp in young), Aspiration (esp in elderly), Adverse effects of DPI,  Active smokers, plus two Bs  = Bronchiectasis and Beta blocker use..and one C= CHF  Adherence is always the initial "prime suspect" and is a multilayered concern that requires a "trust but verify" approach in every patient - starting with knowing how to use medications, especially inhalers, correctly, keeping up with refills and understanding the fundamental difference between maintenance and prns vs those medications only taken for a very short course and then stopped and not refilled. The proper method of use, as well as anticipated side effects, of this metered-dose inhaler are discussed and demonstrated to the patient. Improve to 75% with coaching  See instructions for specific recommendations which were reviewed directly with the patient who was given a copy with highlighter outlining the key components.

## 2012-01-15 NOTE — Progress Notes (Signed)
Subjective:     Patient ID: [name], female   DOB: 08-28-1961    MRN: [id number]  Brief patient profile:  51 yobf quit smoking 10/2004 dx by transbronchial biopsy 5/92 with NCG inflammatory change consistent with sarcoid. Since that time she's been off and on prednisone multiple  times with symptoms of coughing dyspnea and skin involvement > weaned off chronic prednisone Feb 2012.   Overall she's been doing much better since she stopped smoking 10/2004 and much better since change to Symbicort 160/4.5 2 two times a day, able to do adl's ok without limitations including some steps.     June 26, 2008 returns for PFT's still prednisone 5mg  one daily plus symbicort 160, no new problems > no change rx    March 02, 2010 6 wk followup with PFT's. Pt states that her breathing is much better. rec add spriva trial.   October 27, 2010 ov @ 5 mg/day cc cough better, breathing better walking up to 2 miles a day on the job . >>rec pred 5mg  1/2 every other day   Stopped prednisone even the qod late Feb 2012 > no flare of symptoms or nausea/fatigue so remained off   05/03/2011 ov/ Conn Trombetta no flare of cough or sob off prednisone.  No ocular or articular symptoms, no rash.  rec try off spiriva and just use the proaire prn.  06/05/2011 ov/Kendra Woolford worse off spiriva but did not restart it,  Using proaire daily then sick x 3-4 days with green sputum and increased need for proaire for sob. Not using hfa optimally. Assoc with nasal congestion and ear pain bilaterally. No longer using ppi but denies overt HB  rec approp use of nasal steroids reviewed Prednisone 10 mg take  4 each am x 2 days,   2 each am x 2 days,  1 each am x2days and stop Zpack called in Work on inhaler technique:   Leave off spiriva for now  Try prilosec 20mg   Take 30-60 min before first meal of the day and Pepcid 20 mg one bedtime  06/29/2011 f/u ov/Charlot Gouin cc variable sob esp in am but no longer green sputum, sinuses feel clear.  Back on  prednisone since 8/15 for predominantly rhinitis flare. No more cough and overall back to baseline x occ sob not reproducible with exercise. rec Pepcid 20 mg one at bedtime GERD diet     08/01/2011 f/u ov/Ismail Graziani cc breathing the best it's been in a long time. No limitations or cough or daytime saba rescue >no changes   08/24/2011 Acute OV  PT complains of wheezing x 2 days.  sob with activity x 2 days--no cough but some sneezing. Some drainage at times. No fever or discolored mucus. Does not take symbicort on regular basis.  No otc used. No chest pain or edema.   rec Remain on Symbicort regularly everyday w/ no missed doses.  Prednisone taper over next week.  Please contact office for sooner follow up if symptoms do not improve or worsen or seek emergency care  follow up Dr. [name]  In 6 weeks> did not make appt  01/15/2012 f/u ov/Hye Trawick cc Pt c/o prod cough with thick, green sputum x 1 wk. She also states has slight increase SOB and chest tightness. Called in zpak and prednisone > Using rescue q 4-5 hours but improving vs onset and plans tooth extraction on 3/7. No fever or lateralizing cp, sputum turning llighter.  Sleeping ok without nocturnal  or early am exacerbation  of respiratory  c/o's or need for noct saba. Also denies any obvious fluctuation of symptoms with weather or environmental changes or other aggravating or alleviating factors except as outlined above   ROS  At present neg for  any significant sore throat, dysphagia, itching, sneezing,  nasal congestion or excess/ purulent secretions,  fever, chills, sweats, unintended wt loss, pleuritic or exertional cp, hempoptysis, orthopnea pnd or leg swelling.  Also denies presyncope, palpitations, heartburn, abdominal pain, nausea, vomiting, diarrhea  or change in bowel or urinary habits, dysuria,hematuria,  rash, arthralgias, visual complaints, headache, numbness weakness or ataxia.        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 Marga Melnick  -h/o Daily prednisone since 2007   > off completely Feb 2012 with no problem ? 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 (ICD-280.9)  Health Maintenance.......................................................   [facility]   Family History:  crohn`s in daughter, M-lupus, htn, asthma,  no Ca, DM, CAD   Social History:   lives with twin children and grandson. single; >20 pack year history, quit in 2005; no EtOH  works for [employer]               Objective:   Physical Exam wt 166 Oct 12, 2009 >170 October 28, 2010 > 169 06/29/2011 >>171 08/24/2011 > 01/15/2012  171  amb bf nad  HEENT mild turbinate edema. Oropharynx no thrush or excess pnd or cobblestoning. No JVD or cervical adenopathy. Mild accessory muscle hypertrophy. Trachea midline, nl thryroid. Lungs Coarse BS w/ faint exp wheeze . Regular rate and rhythm without murmur gallop or rub or increase P2 or edema. Abd: no hsm, nl excursion. Ext warm without cyanosis or clubbing.     cxr 05/03/11 Stable areas of scarring throughout the lungs. No active disease.   Assessment:         Plan:

## 2012-01-15 NOTE — Patient Instructions (Signed)
Cough medicine is mucinex dm  Work on inhaler technique:  relax and gently blow all the way out then take a nice smooth deep breath back in, triggering the inhaler at same time you start breathing in.  Hold for up to 5 seconds if you can.  Rinse and gargle with water when done   If your mouth or throat starts to bother you,   I suggest you time the inhaler to your dental care and after using the inhaler(s) brush teeth and tongue with a baking soda containing toothpaste and when you rinse this out, gargle with it first to see if this helps your mouth and throat.     Please schedule a follow up visit in 3 months but call sooner if needed

## 2012-01-15 NOTE — Assessment & Plan Note (Signed)
Positive transbronchial biopsy 02/19/91 by Dr. Marga Melnick  -Daily prednisone since 2007 with ? adrenal insufficiency iatrogenic > off completely Feb 2012 with no problem - PFT's 06/26/08 FEV1  1.09 ratio  - PFTs  03/02/10 FEV1  1.06 (42%) ratio 34 and no better p B2,  DLC0 48% corrects to 64% - PFT's 08/01/2011 FEV1 1.6 (42%)  41 ratio  DLCO 55 corrects 77 - HFA 50% 06/05/2011  > 75% 06/29/2011   No evidence of sarcoid activity, no need for chronic prednisone rx

## 2012-01-19 NOTE — Progress Notes (Signed)
HPI:  [name] is a 51 y.o. female who complains of congestion, sore throat, nasal blockage and post nasal drip for 3 days. She denies a history of chest pain, chills, fevers, shortness of breath and cough and has a history of sarcoidosis. Patient has hx of smoking cigarettes.  She reports this is typical for her infections.  She additionally reports dizziness and relates this to being anemic.   OBJECTIVE: She appears well, vital signs are as noted. Ears normal.  Throat and pharynx normal.  Neck supple. No adenopathy in the neck. Nose is congested. Sinuses non tender. The chest is clear, without wheezes or rales.  ASSESSMENT:  viral upper respiratory illness  PLAN: Symptomatic therapy suggested: push fluids, rest and return office visit prn if symptoms persist or worsen. Lack of antibiotic effectiveness discussed with her. Call or return to clinic prn if these symptoms worsen or fail to improve as anticipated.  Aware of patient's respiratory history and concerns she will worsen into a upper respiratory infection will require antibiotics however no acute indication at this time.   Dizziness likely due to acute viral illness however will check CBC

## 2012-01-25 ENCOUNTER — Ambulatory Visit: Payer: BC Managed Care – PPO | Admitting: Internal Medicine

## 2012-02-16 ENCOUNTER — Other Ambulatory Visit: Payer: Self-pay | Admitting: Family Medicine

## 2012-03-14 ENCOUNTER — Encounter: Payer: Self-pay | Admitting: Family Medicine

## 2012-03-14 ENCOUNTER — Ambulatory Visit (INDEPENDENT_AMBULATORY_CARE_PROVIDER_SITE_OTHER): Payer: BC Managed Care – PPO | Admitting: Family Medicine

## 2012-03-14 VITALS — BP 130/72 | Temp 98.7°F | Ht 65.0 in | Wt 167.0 lb

## 2012-03-14 DIAGNOSIS — R062 Wheezing: Secondary | ICD-10-CM

## 2012-03-14 DIAGNOSIS — A630 Anogenital (venereal) warts: Secondary | ICD-10-CM

## 2012-03-14 DIAGNOSIS — Z1231 Encounter for screening mammogram for malignant neoplasm of breast: Secondary | ICD-10-CM

## 2012-03-14 DIAGNOSIS — Z01419 Encounter for gynecological examination (general) (routine) without abnormal findings: Secondary | ICD-10-CM

## 2012-03-14 DIAGNOSIS — I10 Essential (primary) hypertension: Secondary | ICD-10-CM

## 2012-03-14 DIAGNOSIS — Z0000 Encounter for general adult medical examination without abnormal findings: Secondary | ICD-10-CM

## 2012-03-14 MED ORDER — HYDROCHLOROTHIAZIDE 25 MG PO TABS: 12.5000 mg | ORAL_TABLET | Freq: Every day | ORAL | Status: DC

## 2012-03-14 MED ORDER — PREDNISONE 10 MG PO TABS: ORAL_TABLET | ORAL | Status: DC

## 2012-03-14 MED ORDER — OMEPRAZOLE 20 MG PO CPDR: 20.0000 mg | DELAYED_RELEASE_CAPSULE | Freq: Every day | ORAL | Status: DC | PRN

## 2012-03-14 MED ORDER — PODOFILOX 0.5 % EX SOLN: Freq: Two times a day (BID) | CUTANEOUS | Status: AC

## 2012-03-14 MED ORDER — FLUTICASONE PROPIONATE 50 MCG/ACT NA SUSP: NASAL | Status: DC

## 2012-03-14 NOTE — Assessment & Plan Note (Addendum)
Airway reactivity may be related to sarcoidosis vs COPD. No focal sign or symptoms of pneumonia currently, therefore imaging not ordered. Patient to continue symbicort, albuterol prn. Will give short prednisone taper and advised to f/u with PCP or Dr. [name] if symptoms do not improve, or seek emergency care if having dyspnea, worsening of symptoms, fever, chest pain.

## 2012-03-14 NOTE — Progress Notes (Signed)
  Subjective:    Patient ID: [name], female    DOB: January 16, 1961, 51 y.o.   MRN: [id number]  HPI present for a physical today, but has several complaints.  1. Wheezing. Has sarcoid and dx of COPD. Taking albuterol prn but not helping much. She was treated with a prednisone taper last month given by pulmonologist Dr. [name]. States this typically helps her symptoms. Currently going on for a few days. She denies any fever, chills, productive cough, rhinorrhea, congestion, chest pain, nausea, dizziness, fatigue.   2. Genital warts. Tried aldara previously that did not help. Has one area outside of rectum she would like treated. Not painful or irritated. Asks if anything can be tried topically at home.  3. HTN. Has not taken HCTZ in 3 weeks. Only takes this prn when she feels a headache, tired or is >150 systolic. Denies edema.  4. HM. Due for mammogram.   Review of Systems See HPI otherwise negative. Nonsmoker.    Objective:   Physical Exam  Vitals reviewed. Constitutional: She is oriented to person, place, and time. She appears well-developed and well-nourished. No distress.  HENT:  Head: Normocephalic and atraumatic.  Mouth/Throat: Oropharynx is clear and moist.  Eyes: EOM are normal. Pupils are equal, round, and reactive to light.  Cardiovascular: Normal rate, regular rhythm, normal heart sounds and intact distal pulses.   No murmur heard. Pulmonary/Chest: Effort normal. No respiratory distress. She has wheezes. She has no rales. She exhibits no tenderness.       Bilateral faint exp wheezes and general diminished BS.  Abdominal: Soft.  Musculoskeletal: She exhibits no edema and no tenderness.  Neurological: She is alert and oriented to person, place, and time. Coordination normal.  Skin: No rash noted.  Psychiatric: She has a normal mood and affect.       Assessment & Plan:

## 2012-03-14 NOTE — Assessment & Plan Note (Signed)
Trial of topical at home podophilox (failed aldara). Advised patient to return to care for TCA treatment prn.

## 2012-03-14 NOTE — Patient Instructions (Signed)
Your wheezing may be due to sarcoidosis or COPD. If prednisone does not help, make sure to call your doctor.  You may try podophilox on anal wart only, do not put on vaginal area.  Make an appointment in 2-3 weeks for follow up as we can treat wart in the office also. Make sure to schedule mammogram. Make an appointment for check up in 6 months.  Only take half tablet HCTZ for blood pressure.

## 2012-03-14 NOTE — Assessment & Plan Note (Signed)
At goal today. Advised patient she may only require half tablet HCTZ if desired; however she has not been taking this regularly so I did not make strong recommendations. F/u labs at next visit.

## 2012-03-18 ENCOUNTER — Telehealth: Payer: Self-pay | Admitting: Family Medicine

## 2012-03-18 MED ORDER — FLUCONAZOLE 150 MG PO TABS: 150.0000 mg | ORAL_TABLET | Freq: Once | ORAL | Status: AC

## 2012-03-18 NOTE — Telephone Encounter (Signed)
Returned call to patient and informed of info from Dr. Tye Savoy.  Gaylene Brooks, RN

## 2012-03-18 NOTE — Telephone Encounter (Signed)
Diflucan sent to [facility] today.  If no improvement, please inform patient that she should schedule appointment with PCP.

## 2012-03-18 NOTE — Telephone Encounter (Signed)
Pt was given abx and now has yeast inf.  Wants to know if she can have something called in for this.  [facility] [address]

## 2012-03-18 NOTE — Telephone Encounter (Addendum)
Returned call to patient.  Was seen last week and given abx.  Now having vaginal itching with white discharge.  Denies odor.  Also, using Symbicort inhaler and rinses mouth out after each use.  Patient thinks she may have thrush and states her mouth "looks like what babies get with redness and white coating."  Wants to know if something can be called in for both symptoms.  Dr. Cristal Ford not in office and will route note to Dr. Tye Savoy.    Gaylene Brooks, RN

## 2012-03-19 ENCOUNTER — Ambulatory Visit (HOSPITAL_COMMUNITY)
Admission: RE | Admit: 2012-03-19 | Discharge: 2012-03-19 | Disposition: A | Discharge status: Home / Self Care | Payer: BC Managed Care – PPO | Source: Ambulatory Visit | Attending: Family Medicine | Admitting: Family Medicine

## 2012-03-19 ENCOUNTER — Telehealth: Payer: Self-pay | Admitting: Family Medicine

## 2012-03-19 DIAGNOSIS — Z1231 Encounter for screening mammogram for malignant neoplasm of breast: Secondary | ICD-10-CM | POA: Insufficient documentation

## 2012-03-19 NOTE — Telephone Encounter (Signed)
Spoke with patient and she took the Diflucan yesterday around 2:00 PM. Vaginal itching has improved but mouth irritation continues. Consulted with Dr. [place] and advised that should improve within 24 hours after diflucan . Appointment scheduled tomorrow for evaluation.

## 2012-03-19 NOTE — Telephone Encounter (Signed)
Patient is calling to speak to the nurse about sending in an Rx for something on her lips that is irritating.  She would like to speak to the nurse about whether this is something she needs to be seen for.

## 2012-03-20 ENCOUNTER — Ambulatory Visit: Payer: BC Managed Care – PPO | Admitting: Family Medicine

## 2012-04-04 ENCOUNTER — Telehealth: Payer: Self-pay | Admitting: Family Medicine

## 2012-04-04 NOTE — Telephone Encounter (Signed)
Will forward message to Dr. Cristal Ford  to please advise.

## 2012-04-04 NOTE — Telephone Encounter (Signed)
Please have patient come in for a MD visit.

## 2012-04-04 NOTE — Telephone Encounter (Signed)
Is going to have eye surgery and needs an EKG & CXR - wants to know if she needs to see doctor or can she just come and have this done by nurse?  He also wanted labs and she had labs done here in Feb.

## 2012-04-05 NOTE — Telephone Encounter (Signed)
LMOM for patient to call back to inform of below and have appointment scheduled for her

## 2012-04-11 ENCOUNTER — Ambulatory Visit (HOSPITAL_COMMUNITY)
Admission: RE | Admit: 2012-04-11 | Discharge: 2012-04-11 | Disposition: A | Discharge status: Home / Self Care | Payer: BC Managed Care – PPO | Source: Ambulatory Visit | Attending: Family Medicine | Admitting: Family Medicine

## 2012-04-11 ENCOUNTER — Ambulatory Visit (INDEPENDENT_AMBULATORY_CARE_PROVIDER_SITE_OTHER): Payer: BC Managed Care – PPO | Admitting: Family Medicine

## 2012-04-11 ENCOUNTER — Ambulatory Visit
Admission: RE | Admit: 2012-04-11 | Discharge: 2012-04-11 | Disposition: A | Discharge status: Home / Self Care | Payer: BC Managed Care – PPO | Source: Ambulatory Visit | Attending: Family Medicine | Admitting: Family Medicine

## 2012-04-11 ENCOUNTER — Encounter: Payer: Self-pay | Admitting: Family Medicine

## 2012-04-11 VITALS — BP 143/89 | HR 73 | Temp 97.7°F | Ht 65.0 in | Wt 174.9 lb

## 2012-04-11 DIAGNOSIS — Z01818 Encounter for other preprocedural examination: Secondary | ICD-10-CM

## 2012-04-11 DIAGNOSIS — D869 Sarcoidosis, unspecified: Secondary | ICD-10-CM

## 2012-04-11 DIAGNOSIS — Z419 Encounter for procedure for purposes other than remedying health state, unspecified: Secondary | ICD-10-CM

## 2012-04-11 DIAGNOSIS — I10 Essential (primary) hypertension: Secondary | ICD-10-CM

## 2012-04-11 DIAGNOSIS — B370 Candidal stomatitis: Secondary | ICD-10-CM

## 2012-04-11 DIAGNOSIS — Z01810 Encounter for preprocedural cardiovascular examination: Secondary | ICD-10-CM | POA: Insufficient documentation

## 2012-04-11 MED ORDER — FLUCONAZOLE 150 MG PO TABS: 150.0000 mg | ORAL_TABLET | Freq: Once | ORAL | Status: AC

## 2012-04-11 NOTE — Assessment & Plan Note (Signed)
Borderline, at goal. Consider increase in HCTZ or addition of CCB or ACEi at next visit if worsened.

## 2012-04-11 NOTE — Assessment & Plan Note (Signed)
No active exacerbations. Stable on inhaled medications.

## 2012-04-11 NOTE — Assessment & Plan Note (Signed)
Likely a very mild case of thrush with inhaled steroid use. Discussed using topical nystatin, however patient wishes to try diflucan as she also describes some vaginal irritation and itching consistent with candidiasis.

## 2012-04-11 NOTE — Assessment & Plan Note (Addendum)
Patient is low risk for surgery. EKG performed and reviewed today normal limits . No concerns for arrhythmia or ACS. We'll order chest x-ray per request of surgeon and forward most recent lab results to their office.

## 2012-04-11 NOTE — Patient Instructions (Signed)
Good luck with eye surgery. Will fax results to Dr. Karleen Hampshire. Make appointment for check up in 4-6 months. I have sent diflucan to [facility] for your thrush.

## 2012-04-11 NOTE — Progress Notes (Signed)
  Subjective:    Patient ID: [name], female    DOB: 1961/10/13, 51 y.o.   MRN: [id number]  HPI  1. Pre-operative clearance for eye malalignment/double vision. Patient is scheduled for eye surgery to improve alignment of her eyes in 6 days. She is to undergo general anesthesia. Her ophthalmologist request clearance with CBC, EKG and chest x-ray. Patient feels well recently. No history of arrhythmia or coronary artery disease.  Denies any exertional dyspnea, chest pain, palpitations, leg edema.   2. mouth irritation. Patient thinks she may have some mild thrush with intermittent symptoms of sandy gritty feeling in her in her buccal mucosa. At times this appears white. She admits to not rinsing her mouth after using Symbicort on a daily basis. Denies bleeding, ulceration.  3. Sarcoidosis. This is well controlled and not currently taking any immunosuppressive therapies. Stable on symbicort and albuterol as needed. Denies cough, chest pain, fevers.   4. HTN. Taking HCTZ half tablet. No side effects. BP Readings from Last 3 Encounters:  04/11/12 143/89  03/14/12 130/72  01/15/12 138/92    Patient Active Problem List  Diagnoses  . Sarcoidosis  . ANEMIA, IRON DEFICIENCY, UNSPEC.  Marland Kitchen SICKLE-CELL TRAIT  . HYPERTENSION, BENIGN SYSTEMIC  . RHINITIS, ALLERGIC  . COPD  . BACK PAIN, LOW  . Dizziness  . Anal wart  . Preoperative examination  . Thrush   Home medications reviewed.  Review of Systems See HPI otherwise negative.  reports that she quit smoking about 8 years ago. She does not have any smokeless tobacco history on file.    Objective:   Physical Exam  Vitals reviewed. Constitutional: She is oriented to person, place, and time. She appears well-developed and well-nourished. No distress.  HENT:  Head: Normocephalic and atraumatic.       Inner lining of the buccal mucosa with some very faint irritation noticed, no white plaques or ulcerations.  Eyes: EOM are normal. Pupils are  equal, round, and reactive to light.  Neck: Normal range of motion. Neck supple.  Cardiovascular: Normal rate, regular rhythm, normal heart sounds and intact distal pulses.  Exam reveals no gallop.   No murmur heard. Pulmonary/Chest: Effort normal and breath sounds normal. No respiratory distress. She has no wheezes. She has no rales. She exhibits no tenderness.  Abdominal: Soft.  Musculoskeletal: She exhibits no edema and no tenderness.  Neurological: She is alert and oriented to person, place, and time.  Skin: No rash noted.  Psychiatric: She has a normal mood and affect.    EKG: NSR, 71 bpm, normal intervals, mild atrial enlargement, no ischemic changes     Assessment & Plan:

## 2012-04-18 ENCOUNTER — Telehealth: Payer: Self-pay | Admitting: Internal Medicine

## 2012-04-18 NOTE — Telephone Encounter (Signed)
Sample up front and the pt made aware.

## 2012-04-24 ENCOUNTER — Ambulatory Visit (INDEPENDENT_AMBULATORY_CARE_PROVIDER_SITE_OTHER): Payer: BC Managed Care – PPO | Admitting: Internal Medicine

## 2012-04-24 ENCOUNTER — Encounter: Payer: Self-pay | Admitting: Internal Medicine

## 2012-04-24 VITALS — BP 160/96 | HR 88 | Temp 98.2°F | Ht 65.0 in | Wt 176.2 lb

## 2012-04-24 DIAGNOSIS — D869 Sarcoidosis, unspecified: Secondary | ICD-10-CM

## 2012-04-24 DIAGNOSIS — B370 Candidal stomatitis: Secondary | ICD-10-CM

## 2012-04-24 MED ORDER — FLUCONAZOLE 100 MG PO TABS: 100.0000 mg | ORAL_TABLET | Freq: Every day | ORAL | Status: AC

## 2012-04-24 NOTE — Patient Instructions (Addendum)
Diflucan 100 mg daily x 7 days - if mouth not better see Dr Warren Danes  Reduce symbicort 160 to 2 puffs each am but increase back to twice daily if breathing or coughing or need for rescue inhaler worsen  Work on inhaler technique:  relax and gently blow all the way out then take a nice smooth deep breath back in, triggering the inhaler at same time you start breathing in.  Hold for up to 5 seconds if you can.  Rinse and gargle with water when done   If your mouth or throat starts to bother you,   I suggest you time the inhaler to your dental care and after using the inhaler(s) brush teeth and tongue with a baking soda containing toothpaste and when you rinse this out, gargle with it first to see if this helps your mouth and throat.      Please schedule a follow up visit in 3 months but call sooner if needed

## 2012-04-24 NOTE — Progress Notes (Signed)
Subjective:     Patient ID: [name], female   DOB: 04-21-1961    MRN: [id number]  Brief patient profile:  51 yobf quit smoking 10/2004 dx by transbronchial biopsy 5/92 with NCG inflammatory change consistent with sarcoid. Since that time she's been off and on prednisone multiple  times with symptoms of coughing dyspnea and skin involvement > weaned off chronic prednisone Feb 2012.   Overall she's been doing much better since she stopped smoking 10/2004 and much better since change to Symbicort 160/4.5 2 two times a day, able to do adl's ok without limitations including some steps.     June 26, 2008 returns for PFT's still prednisone 5mg  one daily plus symbicort 160, no new problems > no change rx    March 02, 2010 6 wk followup with PFT's. Pt states that her breathing is much better. rec add spriva trial.   October 27, 2010 ov @ 5 mg/day cc cough better, breathing better walking up to 2 miles a day on the job . >>rec pred 5mg  1/2 every other day   Stopped prednisone even the qod late Feb 2012 > no flare of symptoms or nausea/fatigue so remained off   05/03/2011 ov/ [name] no flare of cough or sob off prednisone.  No ocular or articular symptoms, no rash.  rec try off spiriva and just use the proaire prn.  06/05/2011 ov/[name] worse off spiriva but did not restart it,  Using proaire daily then sick x 3-4 days with green sputum and increased need for proaire for sob. Not using hfa optimally. Assoc with nasal congestion and ear pain bilaterally. No longer using ppi but denies overt HB  rec approp use of nasal steroids reviewed Prednisone 10 mg take  4 each am x 2 days,   2 each am x 2 days,  1 each am x2days and stop Zpack called in Work on inhaler technique:   Leave off spiriva for now  Try prilosec 20mg   Take 30-60 min before first meal of the day and Pepcid 20 mg one bedtime  06/29/2011 f/u ov/Lendell Gallick cc variable sob esp in am but no longer green sputum, sinuses feel clear.  Back on  prednisone since 8/15 for predominantly rhinitis flare. No more cough and overall back to baseline x occ sob not reproducible with exercise. rec Pepcid 20 mg one at bedtime GERD diet     08/01/2011 f/u ov/[name] cc breathing the best it's been in a long time. No limitations or cough or daytime saba rescue >no changes   08/24/2011 Acute OV  PT complains of wheezing x 2 days.  sob with activity x 2 days--no cough but some sneezing. Some drainage at times. No fever or discolored mucus. Does not take symbicort on regular basis.  No otc used. No chest pain or edema.   rec Remain on Symbicort regularly everyday w/ no missed doses.  Prednisone taper over next week.  Please contact office for sooner follow up if symptoms do not improve or worsen or seek emergency care  follow up Dr. [name]  In 6 weeks> did not make appt  01/15/2012 f/u ov/[name] cc Pt c/o prod cough with thick, green sputum x 1 wk. She also states has slight increase SOB and chest tightness. Called in zpak and prednisone > Using rescue q 4-5 hours but improving vs onset and plans tooth extraction on 3/7. No fever or lateralizing cp, sputum turning llighter. rec Inhaler technique poor > reviewed   04/24/2012 f/u ov/[name]  cc sore mouth x weeks, indolent ponset/ persistent, given diflucan pill x one minimally better,  No sob/ cough.   Sleeping ok without nocturnal  or early am exacerbation  of respiratory  c/o's or need for noct saba. Also denies any obvious fluctuation of symptoms with weather or environmental changes or other aggravating or alleviating factors except as outlined above   ROS  At present neg for  any significant sore throat, dysphagia, itching, sneezing,  nasal congestion or excess/ purulent secretions,  fever, chills, sweats, unintended wt loss, pleuritic or exertional cp, hempoptysis, orthopnea pnd or leg swelling.  Also denies presyncope, palpitations, heartburn, abdominal pain, nausea, vomiting, diarrhea  or change in bowel  or urinary habits, dysuria,hematuria,  rash, arthralgias, visual complaints, headache, numbness weakness or ataxia.        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 Marga Melnick  -h/o Daily prednisone since 2007   > off completely Feb 2012 with no problem ? 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 (ICD-280.9)  Health Maintenance.......................................................   [facility]   Family History:  crohn`s in daughter, M-lupus, htn, asthma,  no Ca, DM, CAD   Social History:   lives with twin children and grandson. single; >20 pack year history, quit in 2005; no EtOH  works for [employer]               Objective:   Physical Exam wt 166 Oct 12, 2009 >170 October 28, 2010 > 169 06/29/2011 >>171 08/24/2011 > 01/15/2012  171 > 04/24/2012 176 amb bf nad  HEENT mild turbinate edema. Oropharynx moderate thrush but no excess pnd or cobblestoning. No JVD or cervical adenopathy. Mild accessory muscle hypertrophy. Trachea midline, nl thryroid. Lungs good air movement no wheeze.  Regular rate and rhythm without murmur gallop or rub or increase P2 or edema. Abd: no hsm, nl excursion. Ext warm without cyanosis or clubbing.   cxr 04/11/12 Stable chronic change. No active lung disease    Assessment:           Plan:

## 2012-04-25 NOTE — Assessment & Plan Note (Signed)
Only moderate clinically rx diflucan 100 x 7 days then ? Try spacer or lower dose of ICS

## 2012-04-25 NOTE — Assessment & Plan Note (Addendum)
Positive transbronchial biopsy 02/19/91 by Dr. Marga Melnick  -Daily prednisone since 2007 with ? adrenal insufficiency iatrogenic > off completely Feb 2012 with no problem - PFT's 06/26/08 FEV1  1.09 ratio  - PFTs  03/02/10 FEV1  1.06 (42%) ratio 34 and no better p B2,  DLC0 48% corrects to 64% - PFT's 08/01/2011 FEV1 1.6 (42%)  41 ratio  DLCO 55 corrects 77 - HFA  90% 04/24/2012   Very good control of symptoms s need for systemic steroids on max dose symbicort with only issue being mod thrush assoc with suboptimal hfa  The proper method of use, as well as anticipated side effects, of a metered-dose inhaler are discussed and demonstrated to the patient. Improved effectiveness after extensive coaching during this visit to a level of approximately  90%.  If not improving next step is try spacer.

## 2012-04-30 ENCOUNTER — Encounter: Payer: Self-pay | Admitting: Family Medicine

## 2012-04-30 ENCOUNTER — Other Ambulatory Visit (HOSPITAL_COMMUNITY)
Admission: RE | Admit: 2012-04-30 | Discharge: 2012-04-30 | Disposition: A | Discharge status: Home / Self Care | Payer: BC Managed Care – PPO | Source: Ambulatory Visit | Attending: Family Medicine | Admitting: Family Medicine

## 2012-04-30 ENCOUNTER — Ambulatory Visit (INDEPENDENT_AMBULATORY_CARE_PROVIDER_SITE_OTHER): Payer: BC Managed Care – PPO | Admitting: Family Medicine

## 2012-04-30 VITALS — BP 151/92 | HR 92 | Temp 98.2°F | Ht 65.0 in | Wt 174.9 lb

## 2012-04-30 DIAGNOSIS — Z113 Encounter for screening for infections with a predominantly sexual mode of transmission: Secondary | ICD-10-CM | POA: Insufficient documentation

## 2012-04-30 DIAGNOSIS — B9689 Other specified bacterial agents as the cause of diseases classified elsewhere: Secondary | ICD-10-CM

## 2012-04-30 DIAGNOSIS — A499 Bacterial infection, unspecified: Secondary | ICD-10-CM

## 2012-04-30 DIAGNOSIS — N760 Acute vaginitis: Secondary | ICD-10-CM

## 2012-04-30 LAB — POCT WET PREP (WET MOUNT)
Clue Cells Wet Prep Whiff POC: POSITIVE
WBC, Wet Prep HPF POC: <5

## 2012-04-30 MED ORDER — METRONIDAZOLE 500 MG PO TABS: 500.0000 mg | ORAL_TABLET | Freq: Two times a day (BID) | ORAL | Status: DC

## 2012-04-30 NOTE — Patient Instructions (Addendum)
Will call you on your cell phone with positive results [phone]

## 2012-04-30 NOTE — Assessment & Plan Note (Signed)
Metronidazole 500 bid x 7 days.

## 2012-04-30 NOTE — Progress Notes (Signed)
  Subjective:    Patient ID: [name], female    DOB: 1961-01-08, 51 y.o.   MRN: [id number]  HPI  Work in for vaginal discharge  White thin, 1 week.  No abdominal pain, pelvic pain, fever, dysuria.  No recent intercourse.  No antibiotics use.  Review of Systems See HPI    Objective:   Physical Exam GEN: NAD Pelvic Exam:        External: normal female genitalia without lesions or masses        Vagina: normal without lesions or masses        Samples for Wet prep, GC/Chlamydia obtained         Assessment & Plan:

## 2012-05-03 ENCOUNTER — Encounter: Payer: Self-pay | Admitting: Family Medicine

## 2012-05-10 ENCOUNTER — Encounter (HOSPITAL_BASED_OUTPATIENT_CLINIC_OR_DEPARTMENT_OTHER): Payer: Self-pay | Admitting: *Deleted

## 2012-05-13 ENCOUNTER — Encounter (HOSPITAL_BASED_OUTPATIENT_CLINIC_OR_DEPARTMENT_OTHER): Payer: Self-pay | Admitting: *Deleted

## 2012-05-13 NOTE — Progress Notes (Signed)
NPO AFTER MN. ARRIVES AT 0715. NEEDS ISTAT. CURRENT CXR AND EKG IN EPIC AND CHART. WILL TAKE PRILOSEC AND DO SYMBICORT INHALER AM OF SURG. WITH  SIPS OF WATER.

## 2012-05-21 NOTE — H&P (Signed)
Erin SAGONA is an 51 y.o. 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    Chief Complaint: Double vision and head tilt. HPI:  89 year/old BFc a h/o pulmonary sarcoidosis and controlled HTN  presents for elective repair of L 4th N Paresis via strabismus surgery to ablate diplopia and correct ocular alligment .  Past Medical History  Diagnosis Date  . Hypertension   . Sarcoidosis STABLE PER CXR JUNE 2013  . Long-term current use of steroids SYMBICORT INHALER  . Allergic rhinitis   . HTN (hypertension)   . Iron deficiency anemia   . COPD, mild FOLLOWED BY DR [name]  . Sickle cell trait   . 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 Cesarean section 1986    W/ BILATERAL TUBAL LIGATION  . Total robotic assisted laparoscopic hysterectomy 12-30-2010    SYMPTOMATIC UTERINE FIBROIDS  . Upper teeth extraction's 1992    Family History  Problem Relation Age of Onset  . Hyperlipidemia Mother   . Asthma Mother   . Lupus Mother   . Hypertension Father   . Hyperlipidemia Father   . Hypertension Sister   . Hypertension Brother   . Cancer Neg Hx   . Diabetes Neg Hx   . Coronary artery disease Neg Hx    Social History:  reports that she quit smoking about 8 years ago. She has never used smokeless tobacco. She reports that she does not drink alcohol or use illicit drugs.  Allergies: No Known Allergies  No prescriptions prior to admission    No results found for this or any previous visit (from the past 48 hour(s)). No results found.  Review of Systems  Constitutional: Negative.   HENT: Negative.   Eyes: Positive for double vision.       Chronic Left 4 th  Nerve palsy  Respiratory:       Sarcoidosis Pulmonary dz  Cardiovascular: Negative.   Gastrointestinal: Positive for heartburn.  Genitourinary: Negative.   Musculoskeletal: Positive for back pain.  Skin: Negative.   Neurological: Negative.     Endo/Heme/Allergies: Negative.   Psychiatric/Behavioral: Negative.     Height 5\' 5"  (1.651 m), weight 79.379 kg (175 lb). Physical Exam  Constitutional: She appears well-developed and well-nourished.  HENT:  Head: Normocephalic and atraumatic.  Eyes:         Diplopia : LHT and XT  Neck: Normal range of motion. Neck supple.  Cardiovascular: Normal rate.   Respiratory: Effort normal and breath sounds normal.  GI: Soft. Bowel sounds are normal.  Genitourinary: Vaginal discharge found.  Musculoskeletal: Normal range of motion.  Neurological: She is alert.  Skin: Skin is warm.  Psychiatric: She has a normal mood and affect. Her behavior is normal. Judgment and thought content normal.     Assessment/Plan L 4th N Palsy : L Superior oblique UA :LIO OA : LHT:  Compensatory torticollis. Schedule for : LSO tuck:  LIO myectomy : RIR recession on AS.  Alita Waldren A 05/21/2012, 6:32 PM

## 2012-05-22 ENCOUNTER — Encounter (HOSPITAL_BASED_OUTPATIENT_CLINIC_OR_DEPARTMENT_OTHER)
Admission: RE | Disposition: A | Discharge status: Home / Self Care | Payer: Self-pay | Source: Ambulatory Visit | Attending: Ophthalmology

## 2012-05-22 ENCOUNTER — Encounter (HOSPITAL_BASED_OUTPATIENT_CLINIC_OR_DEPARTMENT_OTHER): Payer: Self-pay | Admitting: Anesthesiology

## 2012-05-22 ENCOUNTER — Encounter (HOSPITAL_BASED_OUTPATIENT_CLINIC_OR_DEPARTMENT_OTHER): Payer: Self-pay | Admitting: *Deleted

## 2012-05-22 ENCOUNTER — Ambulatory Visit (HOSPITAL_BASED_OUTPATIENT_CLINIC_OR_DEPARTMENT_OTHER): Payer: BC Managed Care – PPO | Admitting: Anesthesiology

## 2012-05-22 ENCOUNTER — Ambulatory Visit (HOSPITAL_BASED_OUTPATIENT_CLINIC_OR_DEPARTMENT_OTHER)
Admission: RE | Admit: 2012-05-22 | Discharge: 2012-05-22 | Disposition: A | Discharge status: Home / Self Care | Payer: BC Managed Care – PPO | Source: Ambulatory Visit | Attending: Ophthalmology | Admitting: Ophthalmology

## 2012-05-22 DIAGNOSIS — A630 Anogenital (venereal) warts: Secondary | ICD-10-CM

## 2012-05-22 DIAGNOSIS — Z01818 Encounter for other preprocedural examination: Secondary | ICD-10-CM

## 2012-05-22 DIAGNOSIS — I10 Essential (primary) hypertension: Secondary | ICD-10-CM

## 2012-05-22 DIAGNOSIS — IMO0002 Reserved for concepts with insufficient information to code with codable children: Secondary | ICD-10-CM | POA: Insufficient documentation

## 2012-05-22 DIAGNOSIS — D509 Iron deficiency anemia, unspecified: Secondary | ICD-10-CM

## 2012-05-22 DIAGNOSIS — N760 Acute vaginitis: Secondary | ICD-10-CM

## 2012-05-22 DIAGNOSIS — M545 Low back pain, unspecified: Secondary | ICD-10-CM

## 2012-05-22 DIAGNOSIS — J449 Chronic obstructive pulmonary disease, unspecified: Secondary | ICD-10-CM

## 2012-05-22 DIAGNOSIS — J309 Allergic rhinitis, unspecified: Secondary | ICD-10-CM

## 2012-05-22 DIAGNOSIS — H532 Diplopia: Secondary | ICD-10-CM | POA: Insufficient documentation

## 2012-05-22 DIAGNOSIS — J4489 Other specified chronic obstructive pulmonary disease: Secondary | ICD-10-CM | POA: Insufficient documentation

## 2012-05-22 DIAGNOSIS — D573 Sickle-cell trait: Secondary | ICD-10-CM

## 2012-05-22 DIAGNOSIS — R42 Dizziness and giddiness: Secondary | ICD-10-CM

## 2012-05-22 DIAGNOSIS — D869 Sarcoidosis, unspecified: Secondary | ICD-10-CM

## 2012-05-22 DIAGNOSIS — B370 Candidal stomatitis: Secondary | ICD-10-CM

## 2012-05-22 DIAGNOSIS — H4910 Fourth [trochlear] nerve palsy, unspecified eye: Secondary | ICD-10-CM | POA: Insufficient documentation

## 2012-05-22 HISTORY — DX: Sickle-cell trait: D57.3

## 2012-05-22 HISTORY — PX: MEDIAN RECTUS REPAIR: SHX5301

## 2012-05-22 HISTORY — PX: ADJUSTABLE SUTURE MANIPULATION: SHX5290

## 2012-05-22 HISTORY — DX: Complete loss of teeth, unspecified cause, unspecified class: K08.109

## 2012-05-22 HISTORY — DX: Anodontia: K00.0

## 2012-05-22 HISTORY — DX: Chronic obstructive pulmonary disease, unspecified: J44.9

## 2012-05-22 LAB — POCT I-STAT 4, (NA,K, GLUC, HGB,HCT)
Glucose, Bld: 103 mg/dL — ABNORMAL HIGH (ref 70–99)
Hemoglobin: 14.6 g/dL (ref 12.0–15.0)
Potassium: 3.2 mEq/L — ABNORMAL LOW (ref 3.5–5.1)
Sodium: 144 mEq/L (ref 135–145)

## 2012-05-22 SURGERY — REPAIR, MUSCLE, MEDIAL RECTUS
Anesthesia: General | Site: Eye | Laterality: Bilateral | Wound class: Clean

## 2012-05-22 SURGERY — ADJUSTABLE SUTURE MANIPULATION
Anesthesia: Choice | Site: Eye | Laterality: Right | Wound class: Clean

## 2012-05-22 MED ORDER — ONDANSETRON HCL 4 MG/2ML IJ SOLN
INTRAMUSCULAR | Status: DC | PRN
  Administered 2012-05-22: 4 mg via INTRAVENOUS

## 2012-05-22 MED ORDER — IBUPROFEN 400 MG PO TABS
400.0000 mg | ORAL_TABLET | Freq: Four times a day (QID) | ORAL | Status: DC | PRN
  Administered 2012-05-22: 400 mg via ORAL

## 2012-05-22 MED ORDER — TOBRAMYCIN 0.3 % OP OINT
TOPICAL_OINTMENT | OPHTHALMIC | Status: DC | PRN
  Administered 2012-05-22: 1 via OPHTHALMIC

## 2012-05-22 MED ORDER — FENTANYL CITRATE 0.05 MG/ML IJ SOLN
INTRAMUSCULAR | Status: DC | PRN
  Administered 2012-05-22: 50 ug via INTRAVENOUS
  Administered 2012-05-22: 25 ug via INTRAVENOUS
  Administered 2012-05-22: 50 ug via INTRAVENOUS

## 2012-05-22 MED ORDER — PHENYLEPHRINE HCL 2.5 % OP SOLN
OPHTHALMIC | Status: DC | PRN
  Administered 2012-05-22: 2 [drp] via OPHTHALMIC

## 2012-05-22 MED ORDER — MIDAZOLAM HCL 5 MG/5ML IJ SOLN
INTRAMUSCULAR | Status: DC | PRN
  Administered 2012-05-22: 1 mg via INTRAVENOUS

## 2012-05-22 MED ORDER — LACTATED RINGERS IV SOLN
INTRAVENOUS | Status: DC
  Administered 2012-05-22 (×2): via INTRAVENOUS

## 2012-05-22 MED ORDER — PROPOFOL 10 MG/ML IV EMUL
INTRAVENOUS | Status: DC | PRN
  Administered 2012-05-22: 200 mg via INTRAVENOUS

## 2012-05-22 MED ORDER — BSS IO SOLN
INTRAOCULAR | Status: DC | PRN
  Administered 2012-05-22: 15 mL via INTRAOCULAR

## 2012-05-22 MED ORDER — TOBRADEX 0.3-0.1 % OP OINT: TOPICAL_OINTMENT | Freq: Two times a day (BID) | OPHTHALMIC | Status: AC

## 2012-05-22 MED ORDER — BSS IO SOLN
INTRAOCULAR | Status: DC | PRN
  Administered 2012-05-22: 30 mL via INTRAOCULAR

## 2012-05-22 MED ORDER — HYDROCODONE-ACETAMINOPHEN 7.5-750 MG PO TABS: 1.0000 | ORAL_TABLET | Freq: Four times a day (QID) | ORAL | Status: AC | PRN

## 2012-05-22 MED ORDER — GLYCOPYRROLATE 0.2 MG/ML IJ SOLN
INTRAMUSCULAR | Status: DC | PRN
  Administered 2012-05-22: 0.2 mg via INTRAVENOUS

## 2012-05-22 MED ORDER — FENTANYL CITRATE 0.05 MG/ML IJ SOLN
25.0000 ug | INTRAMUSCULAR | Status: DC | PRN
  Administered 2012-05-22: 25 ug via INTRAVENOUS

## 2012-05-22 MED ORDER — KETOROLAC TROMETHAMINE 30 MG/ML IJ SOLN
INTRAMUSCULAR | Status: DC | PRN
  Administered 2012-05-22: 30 mg via INTRAVENOUS

## 2012-05-22 MED ORDER — TETRACAINE HCL 0.5 % OP SOLN
OPHTHALMIC | Status: DC | PRN
  Administered 2012-05-22: 2 [drp] via OPHTHALMIC

## 2012-05-22 MED ORDER — DEXAMETHASONE SODIUM PHOSPHATE 4 MG/ML IJ SOLN
INTRAMUSCULAR | Status: DC | PRN
  Administered 2012-05-22: 10 mg via INTRAVENOUS

## 2012-05-22 MED ORDER — LIDOCAINE HCL (CARDIAC) 20 MG/ML IV SOLN
INTRAVENOUS | Status: DC | PRN
  Administered 2012-05-22: 60 mg via INTRAVENOUS

## 2012-05-22 SURGICAL SUPPLY — 29 items
6-0 ×1 IMPLANT
6-0 MERSILENE ×1 IMPLANT
APL SRG 3 HI ABS STRL LF PLS (MISCELLANEOUS) ×1
APPLICATOR DR MATTHEWS STRL (MISCELLANEOUS) ×2 IMPLANT
CAUTERY EYE LOW TEMP 1300F FIN (OPHTHALMIC RELATED) ×2 IMPLANT
CLOTH BEACON ORANGE TIMEOUT ST (SAFETY) ×2 IMPLANT
CORDS BIPOLAR (ELECTRODE) IMPLANT
COVER MAYO STAND STRL (DRAPES) ×2 IMPLANT
COVER TABLE BACK 60X90 (DRAPES) ×2 IMPLANT
DRAPE LG THREE QUARTER DISP (DRAPES) ×2 IMPLANT
DRAPE SURG 17X23 STRL (DRAPES) ×6 IMPLANT
GLOVE BIOGEL PI IND STRL 6.5 (GLOVE) IMPLANT
GLOVE BIOGEL PI INDICATOR 6.5 (GLOVE) ×1
GLOVE ECLIPSE 6.0 STRL STRAW (GLOVE) ×1 IMPLANT
GLOVE SURG SIGNA 7.5 PF LTX (GLOVE) ×2 IMPLANT
GOWN PREVENTION PLUS LG XLONG (DISPOSABLE) ×2 IMPLANT
GOWN SURGICAL LARGE (GOWNS) ×1 IMPLANT
INDICATOR STEAM BIO RAPID (STERILIZATION PRODUCTS) IMPLANT
NS IRRIG 500ML POUR BTL (IV SOLUTION) ×1 IMPLANT
PACK BASIN DAY SURGERY FS (CUSTOM PROCEDURE TRAY) ×2 IMPLANT
PAD EYE OVAL STERILE LF (GAUZE/BANDAGES/DRESSINGS) IMPLANT
SPEAR EYE SURGICAL ST (MISCELLANEOUS) ×2 IMPLANT
STRIP CLOSURE SKIN 1/2X4 (GAUZE/BANDAGES/DRESSINGS) ×2 IMPLANT
SUT VICRYL 6 0 S 29 12 (SUTURE) ×4 IMPLANT
SUT VICRYL 7 0 TG140 8 (SUTURE) IMPLANT
SUT VICRYL 8 0 TG140 8 (SUTURE) IMPLANT
TOWEL OR 17X24 6PK STRL BLUE (TOWEL DISPOSABLE) ×2 IMPLANT
TRAY DSU PREP LF (CUSTOM PROCEDURE TRAY) ×2 IMPLANT
WATER STERILE IRR 500ML POUR (IV SOLUTION) ×2 IMPLANT

## 2012-05-22 SURGICAL SUPPLY — 13 items
APL SRG 3 HI ABS STRL LF PLS (MISCELLANEOUS) ×1
APPLICATOR DR MATTHEWS STRL (MISCELLANEOUS) ×2 IMPLANT
CLOTH BEACON ORANGE TIMEOUT ST (SAFETY) ×2 IMPLANT
COVER MAYO STAND STRL (DRAPES) ×2 IMPLANT
GAUZE SPONGE 4X4 16PLY XRAY LF (GAUZE/BANDAGES/DRESSINGS) ×2 IMPLANT
GLOVE LITE  25/BX (GLOVE) ×2 IMPLANT
GLOVE SURG SIGNA 7.5 PF LTX (GLOVE) ×2 IMPLANT
MARKER SKIN DUAL TIP RULER LAB (MISCELLANEOUS) ×2 IMPLANT
NS IRRIG 500ML POUR BTL (IV SOLUTION) IMPLANT
PACK ICE SM (MISCELLANEOUS) ×2 IMPLANT
SUT VICRYL 8 0 TG140 8 (SUTURE) IMPLANT
SWAB ALCOHOL LATEX FREE (MISCELLANEOUS) ×12 IMPLANT
SYR 3ML 23GX1 SAFETY (SYRINGE) ×2 IMPLANT

## 2012-05-22 NOTE — Anesthesia Postprocedure Evaluation (Signed)
  Anesthesia Post-op Note  Patient: [name]  Procedure(s) Performed: Procedure(s) (LRB): MEDIAN RECTUS REPAIR (Bilateral)  Patient Location: PACU  Anesthesia Type: General  Level of Consciousness: oriented and sedated  Airway and Oxygen Therapy: Patient Spontanous Breathing  Post-op Pain: mild  Post-op Assessment: Post-op Vital signs reviewed, Patient's Cardiovascular Status Stable, Respiratory Function Stable and Patent Airway  Post-op Vital Signs: stable  Complications: No apparent anesthesia complications

## 2012-05-22 NOTE — Anesthesia Procedure Notes (Signed)
Procedure Name: LMA Insertion Date/Time: 05/22/2012 8:41 AM Performed by: Norva Pavlov Pre-anesthesia Checklist: Patient identified, Emergency Drugs available, Suction available and Patient being monitored Patient Re-evaluated:Patient Re-evaluated prior to inductionOxygen Delivery Method: Circle System Utilized Preoxygenation: Pre-oxygenation with 100% oxygen Intubation Type: IV induction Ventilation: Mask ventilation without difficulty LMA: LMA flexible inserted LMA Size: 4.0 Number of attempts: 1 Airway Equipment and Method: bite block Placement Confirmation: positive ETCO2 Tube secured with: Tape Dental Injury: Teeth and Oropharynx as per pre-operative assessment

## 2012-05-22 NOTE — Anesthesia Preprocedure Evaluation (Signed)
Anesthesia Evaluation  Patient identified by MRN, date of birth, ID band Patient awake    Reviewed: Allergy & Precautions, H&P , NPO status , Patient's Chart, lab work & pertinent test results, reviewed documented beta blocker date and time   Airway Mallampati: II TM Distance: >3 FB Neck ROM: Full    Dental  (+) Edentulous Upper and Edentulous Lower   Pulmonary former smoker,  Sarcoidosis breath sounds clear to auscultation        Cardiovascular hypertension, Pt. on medications negative cardio ROS  Rhythm:Regular Rate:Normal  Denies cardiac symptoms   Neuro/Psych negative neurological ROS  negative psych ROS   GI/Hepatic negative GI ROS, Neg liver ROS,   Endo/Other  negative endocrine ROS  Renal/GU negative Renal ROS  negative genitourinary   Musculoskeletal negative musculoskeletal ROS (+)   Abdominal   Peds negative pediatric ROS (+)  Hematology negative hematology ROS (+)   Anesthesia Other Findings   Reproductive/Obstetrics negative OB ROS                           Anesthesia Physical Anesthesia Plan  ASA: III  Anesthesia Plan: General   Post-op Pain Management:    Induction: Intravenous  Airway Management Planned: LMA  Additional Equipment:   Intra-op Plan:   Post-operative Plan: Extubation in OR  Informed Consent: I have reviewed the patients History and Physical, chart, labs and discussed the procedure including the risks, benefits and alternatives for the proposed anesthesia with the patient or authorized representative who has indicated his/her understanding and acceptance.     Plan Discussed with: CRNA and Surgeon  Anesthesia Plan Comments:         Anesthesia Quick Evaluation

## 2012-05-22 NOTE — Interval H&P Note (Signed)
History and Physical Interval Note:  05/22/2012 8:29 AM  [name]  has presented today for surgery, with the diagnosis of DIPLOPIA  AND HYPERTROPIA BOTH EYES  The various methods of treatment have been discussed with the patient and family. After consideration of risks, benefits and other options for treatment, the patient has consented to  Procedure(s) (LRB): EOMS repair  (Bilateral) as a surgical intervention .  The patient's history has been reviewed, patient examined, no change in status, stable for surgery.  I have reviewed the patients' chart and labs.  Questions were answered to the patient's satisfaction.     Acy Orsak A

## 2012-05-22 NOTE — Transfer of Care (Signed)
Immediate Anesthesia Transfer of Care Note  Patient: [name]  Procedure(s) Performed: Procedure(s) (LRB): MEDIAN RECTUS REPAIR (Bilateral)  Patient Location: PACU  Anesthesia Type: General  Level of Consciousness: awake, alert  and oriented  Airway & Oxygen Therapy: Patient Spontanous Breathing and Patient connected to face mask oxygen  Post-op Assessment: Report given to PACU RN and Post -op Vital signs reviewed and stable  Post vital signs: Reviewed and stable  Complications: No apparent anesthesia complications

## 2012-05-22 NOTE — Progress Notes (Signed)
Daughter left and patient had to wait for daughter to return.

## 2012-05-22 NOTE — Progress Notes (Signed)
Pt taken back to OR via stretcher for Dr Karleen Hampshire to adjust eye sutures.

## 2012-05-22 NOTE — Brief Op Note (Signed)
05/22/2012  10:32 AM  PATIENT:  [name]  51 y.o. female  PRE-OPERATIVE DIAGNOSIS:  DIPLOPIA  AND HYPERTROPIA  POST-OPERATIVE DIAGNOSIS:  S/P Strabismus repair ou   Procedures:   Left Superior oblique Tuck: LIO recess: RIR recession on Adjustable sutures: ADJUSTABLE SUTURE MANIPULATION: os   SURGEON:  Surgeon(s) and Role:    * Corinda Gubler, MD - Primary  PHYSICIAN ASSISTANT:   ASSISTANTS: none   ANESTHESIA:   general  EBL:  Total I/O In: 1000 [I.V.:1000] Out: -   BLOOD ADMINISTERED:none  DRAINS: none   LOCAL MEDICATIONS USED:  NONE  SPECIMEN:  No Specimen  DISPOSITION OF SPECIMEN:  N/A  COUNTS:  YES  TOURNIQUET:  * No tourniquets in log *  DICTATION: .Other Dictation: Dictation Number [id number]  PLAN OF CARE: Discharge to home after PACU  PATIENT DISPOSITION:  Short Stay   Delay start of Pharmacological VTE agent (>24hrs) due to surgical blood loss or risk of bleeding: no

## 2012-05-23 ENCOUNTER — Encounter (HOSPITAL_BASED_OUTPATIENT_CLINIC_OR_DEPARTMENT_OTHER): Payer: Self-pay

## 2012-05-23 ENCOUNTER — Encounter (HOSPITAL_BASED_OUTPATIENT_CLINIC_OR_DEPARTMENT_OTHER): Payer: Self-pay | Admitting: Ophthalmology

## 2012-05-23 NOTE — Op Note (Signed)
[name], [name]:  [id number]  MEDICAL RECORD NO.:  [id number]  LOCATION:                               FACILITY:  [facility]  PHYSICIAN:  Tyrone Apple. 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  PROCEDURE:  Left superior oblique tuck of 8 mm, left inferior oblique recession of 10 mm, right inferior rectus recession of 5 mm on adjustable suture.  SURGEON:  Tyrone Apple. Karleen Hampshire, MD  ANESTHESIA:  General with laryngeal mask airway.  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  This procedure is indicated to restore alignment of visual axis and restore single binocular vision.  The risks and benefits of the procedure explained to the patient's family prior to procedure.  Informed consent was obtained.  DESCRIPTION OF TECHNIQUE:  The patient was taken into the operating room, placed in supine position.  The entire face was prepped and draped in usual sterile fashion after induction of general anesthesia and establishment of laryngeal mask airway.  My attention was first directed to the left eye.  A lid speculum was placed.  Forced duction tests were performed and found to be negative.  The globe was held in superior temporal quadrant.  The eye was depressed and adducted.  An incision was made in the superior temporal fornix, taken down to the posterior subtenons space.  The left superior rectus tendon was then isolated on a Stevens hook and subsequently [employer].  This was used to hold globe in a depressed and adducted position.  Next, the superior oblique tendon was  isolated under direct visualization with 2 Stevens hooks, sequestered on a Green hook, and this was used to hold the globe in abducted and depressed position.  The superior oblique tendon was then carefully dissected free from its overlying muscle fascia and intermuscular septae.  A mark was then placed on the tendon at 8 mm from its insertion and sutures then passed at the 8 mm point and tendon was imbricated on 6- 0 Mersilene suture at this point and an 8 mm superior oblique tuck was then performed using the pre-placed sutures.  The sutures tied securely and the conjunctiva was repositioned.  My attention was then directed to the ipsilateral left inferior oblique tendon.  The globe was held in the inferior temporal quadrant.  An incision was made through the inferior temporal fornix, taken down to the posterior subtenons space, and the left inferior oblique muscle was isolated, coursing from its origin in the anterior floor of the orbit to its insertion in the posterior inferior temporal quadrant of the globe.  It was then carefully sequestered on 2 Stevens hooks and placed on Green hooks.  It was carefully dissected free from its overlying muscle fascia and intermuscular septum.  It was imbricated at its insertion of 6-0 Vicryl suture, taking  2 locking bites in the medial and temporal apices.  It was then detached from its insertion and recessed at 3 mm posterior to the insertion of the ipsilateral inferior rectus and 2 mm lateral and  was then reattached to the globe at this position using the pre-placed sutures.  Sutures tied securely and conjunctiva was repositioned.  My attention was then directed to the right inferior rectus tendon.  A lid speculum was placed.  Forced duction tests were performed and found to be negative.  The globe was then held in the inferior temporal quadrant. The eye was elevated and abducted and incision was made in the inferior temporal fornix, taken down to  the posterior subtenons space and the left inferior rectus tendon and was then isolated on 2 Stevens hooks, Subsequently a Green hook.  It was then carefully dissected free from its overlying muscle fascia and intermuscular septae were transected.  The tendon was then carefully dissected free from its capsulopalpebral fascia for a distance of approximately 18 mm to the insertion of the vortex vein.  It was then imbricated on 6-0 Vicryl sutures at its insertion, detached from the globe and recessed exactly 5 mm and placed on adjustable sutures for later adjustment.   A double pressure patch was applied to the left eye and the patient was awakened from anesthesia and transferred to recovery room awake and in stable condition.  The patient was subsequently allowed to recover for approximately 30 minutes and then was transferred back to the recovery room suite for adjustment of the sutures to orthophoria.   There were no apparent complications.     Casimiro Needle A. Karleen Hampshire, M.D.     MAS/MEDQ  D:  05/22/2012  T:  05/22/2012  Job:  [id number]

## 2012-06-17 ENCOUNTER — Telehealth: Payer: Self-pay | Admitting: Family Medicine

## 2012-06-17 NOTE — Telephone Encounter (Signed)
Advised patient that she will need to be seen for continuing symptoms. Appointment scheduled for tomorrow.

## 2012-06-17 NOTE — Telephone Encounter (Signed)
States that she was seen a few weeks ago for BV and she is still having symptoms and wants to know if something else can be called in.

## 2012-06-18 ENCOUNTER — Ambulatory Visit (INDEPENDENT_AMBULATORY_CARE_PROVIDER_SITE_OTHER): Payer: BC Managed Care – PPO | Admitting: Family Medicine

## 2012-06-18 ENCOUNTER — Encounter: Payer: Self-pay | Admitting: Family Medicine

## 2012-06-18 VITALS — BP 156/85 | HR 87 | Ht 65.0 in | Wt 175.0 lb

## 2012-06-18 DIAGNOSIS — N760 Acute vaginitis: Secondary | ICD-10-CM

## 2012-06-18 DIAGNOSIS — Z2089 Contact with and (suspected) exposure to other communicable diseases: Secondary | ICD-10-CM

## 2012-06-18 DIAGNOSIS — B370 Candidal stomatitis: Secondary | ICD-10-CM

## 2012-06-18 DIAGNOSIS — A499 Bacterial infection, unspecified: Secondary | ICD-10-CM

## 2012-06-18 DIAGNOSIS — Z20828 Contact with and (suspected) exposure to other viral communicable diseases: Secondary | ICD-10-CM

## 2012-06-18 DIAGNOSIS — I10 Essential (primary) hypertension: Secondary | ICD-10-CM

## 2012-06-18 DIAGNOSIS — Z202 Contact with and (suspected) exposure to infections with a predominantly sexual mode of transmission: Secondary | ICD-10-CM

## 2012-06-18 DIAGNOSIS — N898 Other specified noninflammatory disorders of vagina: Secondary | ICD-10-CM

## 2012-06-18 LAB — POCT WET PREP (WET MOUNT): Trichomonas Wet Prep HPF POC: NEGATIVE

## 2012-06-18 MED ORDER — NYSTATIN 100000 UNIT/ML MT SUSP: OROMUCOSAL | Status: DC

## 2012-06-18 MED ORDER — METRONIDAZOLE 0.75 % VA GEL: 1.0000 | VAGINAL | Status: AC

## 2012-06-18 MED ORDER — METRONIDAZOLE 500 MG PO TABS: 500.0000 mg | ORAL_TABLET | Freq: Two times a day (BID) | ORAL | Status: DC

## 2012-06-18 MED ORDER — HYDROCHLOROTHIAZIDE 25 MG PO TABS: 25.0000 mg | ORAL_TABLET | Freq: Every day | ORAL | Status: DC

## 2012-06-18 NOTE — Assessment & Plan Note (Signed)
Difficult to interpret Wet prep today due to residual cream.  Has several documented cases consistent with recurrent BV.  Will rx metronidazole bid x 1 week, the metrogel twice weekly for prophylaxis.  Gave patient edu handout on BV.

## 2012-06-18 NOTE — Progress Notes (Signed)
Subjective:     Patient ID: [name], female   DOB: 1961/07/21, 51 y.o.   MRN: [id number] Examined with Ms3, agree and have edited documentation as below:Kim Earnest Bailey, M.D.  HPI [name] is a 51 year old woman with a history of bacterial vaginosis who comes to clinic today complaining of vaginal itching.   She states she thought it was a yeast infection, and took monistat over the weekend. She used Monistat once on Saturday but has not noticed any improvement. She has had BV twice in the past 12 months and says that this feels very similar.   HTN:  Needs refill on HCTZ 12.5 mg daily  Thrush:  Notes oral thrush for past several days.  No pain.  Used inhaled corticosteroids, forgets to rinse sometimes but has never had ap roblem before.  Has noted no vaginal discharge, no pain with urination, no urgency, and no blood in her urine.   Review of Systems As per above.     Objective:   Physical Exam General: Well appearing woman in no acute distress GU: Normal appearing external vagina, white vaginal discharge noted, cervix was non-erythematous, non-friable.     Assessment:  1. Vaginal itching: BV v. Yeast infections. Will await results from wet prep and then call with instructions.

## 2012-06-18 NOTE — Assessment & Plan Note (Signed)
Was treated with oral fluconazole by pulm, seems to have recurred in setting of inhaled CS.  Advised diligence in rinsing mouth after use.  Will screen for HIV given unprotected sex.  Will rx nystatin oral suspension.

## 2012-06-18 NOTE — Assessment & Plan Note (Signed)
Elevated today.  Needs refill.  Will refill at higher dose- 25 mg- will return to see PCP in 1 month

## 2012-06-18 NOTE — Patient Instructions (Addendum)
Will call you with results of today's test  Also checked yoru HIV and syphilis test

## 2012-06-19 ENCOUNTER — Encounter: Payer: Self-pay | Admitting: Family Medicine

## 2012-06-19 LAB — HIV ANTIBODY (ROUTINE TESTING W REFLEX): HIV: NONREACTIVE

## 2012-06-28 ENCOUNTER — Ambulatory Visit (INDEPENDENT_AMBULATORY_CARE_PROVIDER_SITE_OTHER): Payer: BC Managed Care – PPO | Admitting: Internal Medicine

## 2012-06-28 ENCOUNTER — Encounter: Payer: Self-pay | Admitting: Internal Medicine

## 2012-06-28 VITALS — BP 130/68 | HR 82 | Temp 98.3°F | Ht 65.0 in | Wt 175.0 lb

## 2012-06-28 DIAGNOSIS — J449 Chronic obstructive pulmonary disease, unspecified: Secondary | ICD-10-CM

## 2012-06-28 MED ORDER — OMEPRAZOLE 20 MG PO CPDR: DELAYED_RELEASE_CAPSULE | ORAL | Status: DC

## 2012-06-28 MED ORDER — PREDNISONE (PAK) 10 MG PO TABS: ORAL_TABLET | ORAL | Status: AC

## 2012-06-28 NOTE — Progress Notes (Signed)
Subjective:     Patient ID: [name], female   DOB: 04-21-1961    MRN: [id number]  Brief patient profile:  51 yobf quit smoking 10/2004 dx by transbronchial biopsy 5/92 with NCG inflammatory change consistent with sarcoid. Since that time she's been off and on prednisone multiple  times with symptoms of coughing dyspnea and skin involvement > weaned off chronic prednisone Feb 2012.   Overall she's been doing much better since she stopped smoking 10/2004 and much better since change to Symbicort 160/4.5 2 two times a day, able to do adl's ok without limitations including some steps.     June 26, 2008 returns for PFT's still prednisone 5mg  one daily plus symbicort 160, no new problems > no change rx    March 02, 2010 6 wk followup with PFT's. Pt states that her breathing is much better. rec add spriva trial.   October 27, 2010 ov @ 5 mg/day cc cough better, breathing better walking up to 2 miles a day on the job . >>rec pred 5mg  1/2 every other day   Stopped prednisone even the qod late Feb 2012 > no flare of symptoms or nausea/fatigue so remained off   05/03/2011 ov/ [name] no flare of cough or sob off prednisone.  No ocular or articular symptoms, no rash.  rec try off spiriva and just use the proaire prn.  06/05/2011 ov/[name] worse off spiriva but did not restart it,  Using proaire daily then sick x 3-4 days with green sputum and increased need for proaire for sob. Not using hfa optimally. Assoc with nasal congestion and ear pain bilaterally. No longer using ppi but denies overt HB  rec approp use of nasal steroids reviewed Prednisone 10 mg take  4 each am x 2 days,   2 each am x 2 days,  1 each am x2days and stop Zpack called in Work on inhaler technique:   Leave off spiriva for now  Try prilosec 20mg   Take 30-60 min before first meal of the day and Pepcid 20 mg one bedtime  06/29/2011 f/u ov/[name] cc variable sob esp in am but no longer green sputum, sinuses feel clear.  Back on  prednisone since 8/15 for predominantly rhinitis flare. No more cough and overall back to baseline x occ sob not reproducible with exercise. rec Pepcid 20 mg one at bedtime GERD diet     08/01/2011 f/u ov/[name] cc breathing the best it's been in a long time. No limitations or cough or daytime saba rescue >no changes   08/24/2011 Acute OV  PT complains of wheezing x 2 days.  sob with activity x 2 days--no cough but some sneezing. Some drainage at times. No fever or discolored mucus. Does not take symbicort on regular basis.  No otc used. No chest pain or edema.   rec Remain on Symbicort regularly everyday w/ no missed doses.  Prednisone taper over next week.  Please contact office for sooner follow up if symptoms do not improve or worsen or seek emergency care  follow up Dr. [name]  In 6 weeks> did not make appt  01/15/2012 f/u ov/[name] cc Pt c/o prod cough with thick, green sputum x 1 wk. She also states has slight increase SOB and chest tightness. Called in zpak and prednisone > Using rescue q 4-5 hours but improving vs onset and plans tooth extraction on 3/7. No fever or lateralizing cp, sputum turning llighter. rec Inhaler technique poor > reviewed   04/24/2012 f/u ov/[name]  cc sore mouth x weeks, indolent ponset/ persistent, given diflucan pill x one minimally better,  No sob/ cough.  rec Diflucan 100 mg daily x 7 days - if mouth not better see Dr Warren Danes Reduce symbicort 160 to 2 puffs each am but increase back to twice daily if breathing or coughing or need for rescue inhaler worsen Work on inhaler technique   06/28/2012 f/u ov/[name] cc pt c/o wheezing, and increased SOB x 25month since eye surg.. pt states that she is having to use her HFA more frequently > up to twice daily.  No unusual cough, purulent sputum or sinus/hb symptoms on present rx. Throat feels irritated since put to sleep for eye surgery and not using ppi as rec.      Sleeping ok without nocturnal  or early am  exacerbation  of respiratory  c/o's or need for noct saba. Also denies any obvious fluctuation of symptoms with weather or environmental changes or other aggravating or alleviating factors except as outlined above   ROS  At present neg for  any significant sore throat, dysphagia, itching, sneezing,  nasal congestion or excess/ purulent secretions,  fever, chills, sweats, unintended wt loss, pleuritic or exertional cp, hempoptysis, orthopnea pnd or leg swelling.  Also denies presyncope, palpitations, heartburn, abdominal pain, nausea, vomiting, diarrhea  or change in bowel or urinary habits, dysuria,hematuria,  rash, arthralgias, visual complaints, headache, numbness weakness or ataxia.        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 Marga Melnick  -h/o Daily prednisone since 2007   > off completely Feb 2012 with no problem ? 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 (ICD-280.9)  Health Maintenance.......................................................   [facility]   Family History:  crohn`s in daughter, M-lupus, htn, asthma,  no Ca, DM, CAD   Social History:   lives with twin children and grandson. single; >20 pack year history, quit in 2005; no EtOH  works for [employer]               Objective:   Physical Exam wt 166  Oct 12, 2009 >170 October 28, 2010 > 169 06/29/2011 >>171 08/24/2011 > 01/15/2012  171 > 04/24/2012 176 > 06/28/2012  175 amb bf nad  HEENT mild turbinate edema. Oropharynx clear  but no excess pnd or cobblestoning. No JVD or cervical adenopathy. Mild accessory muscle hypertrophy. Trachea midline, nl thryroid. Lungs good air movement no wheeze.  Regular rate and rhythm without murmur gallop or rub or increase P2 or edema. Abd: no hsm, nl excursion. Ext warm without cyanosis or clubbing.   CXR  06/28/2012 :      Assessment:         Plan:

## 2012-06-28 NOTE — Assessment & Plan Note (Signed)
-   PFT's 06/26/08 FEV1 1.09 ratio  - PFTs 03/02/10 FEV1 1.06 ratio 34    PFT's 08/01/2011 FEV1 1.6 (42%)  41 ratio  DLCO 55 corrects 77 - HFA 75% 01/15/2012  - Spiriva trial March 02, 2010 > better but not worse off it 04/2011  DDX of  difficult airways managment all start with A and  include Adherence, Ace Inhibitors, Acid Reflux, Active Sinus Disease, Alpha 1 Antitripsin deficiency, Anxiety masquerading as Airways dz,  ABPA,  allergy(esp in young), Aspiration (esp in elderly), Adverse effects of DPI,  Active smokers, plus two Bs  = Bronchiectasis and Beta blocker use..and one C= CHF  Adherence is always the initial "prime suspect" and is a multilayered concern that requires a "trust but verify" approach in every patient - starting with knowing how to use medications, especially inhalers, correctly, keeping up with refills and understanding the fundamental difference between maintenance and prns vs those medications only taken for a very short course and then stopped and not refilled. The proper method of use, as well as anticipated side effects, of this metered-dose inhaler are discussed and demonstrated to the patient. 90%  ? Acid reflux > non adherent with rx and most likely present exac started with ET Trauma to the upper airway with GERD fueling the fire of upper airways inflammation > max rx reviewed

## 2012-06-28 NOTE — Patient Instructions (Addendum)
Restart prilosec (omeprazole) Take 30- 60 min before your first and last meals of the day   Prednisone 10 mg take  4 each am x 2 days,   2 each am x 2 days,  1 each am x2days and stop   GERD (REFLUX)  is an extremely common cause of respiratory symptoms, many times with no significant heartburn at all.    It can be treated with medication, but also with lifestyle changes including avoidance of late meals, excessive alcohol, smoking cessation, and avoid fatty foods, chocolate, peppermint, colas, red wine, and acidic juices such as orange juice.  NO MINT OR MENTHOL PRODUCTS SO NO COUGH DROPS  USE SUGARLESS CANDY INSTEAD (jolley ranchers or Stover's)  NO OIL BASED VITAMINS - use powdered substitutes.    Keep appt for September

## 2012-07-19 ENCOUNTER — Ambulatory Visit (INDEPENDENT_AMBULATORY_CARE_PROVIDER_SITE_OTHER): Payer: BC Managed Care – PPO | Admitting: Family Medicine

## 2012-07-19 ENCOUNTER — Encounter: Payer: Self-pay | Admitting: Family Medicine

## 2012-07-19 VITALS — BP 122/84 | HR 81 | Temp 98.1°F | Ht 65.0 in | Wt 167.0 lb

## 2012-07-19 DIAGNOSIS — I10 Essential (primary) hypertension: Secondary | ICD-10-CM

## 2012-07-19 DIAGNOSIS — B370 Candidal stomatitis: Secondary | ICD-10-CM

## 2012-07-19 DIAGNOSIS — R7301 Impaired fasting glucose: Secondary | ICD-10-CM

## 2012-07-19 DIAGNOSIS — K141 Geographic tongue: Secondary | ICD-10-CM

## 2012-07-19 DIAGNOSIS — R42 Dizziness and giddiness: Secondary | ICD-10-CM

## 2012-07-19 DIAGNOSIS — D509 Iron deficiency anemia, unspecified: Secondary | ICD-10-CM

## 2012-07-19 LAB — CBC
HCT: 39.2 % (ref 36.0–46.0)
MCHC: 33.9 g/dL (ref 30.0–36.0)
MCV: 80.2 fL (ref 78.0–100.0)
Platelets: 367 10*3/uL (ref 150–400)
RDW: 15 % (ref 11.5–15.5)
WBC: 6.6 10*3/uL (ref 4.0–10.5)

## 2012-07-19 LAB — BASIC METABOLIC PANEL
BUN: 11 mg/dL (ref 6–23)
Chloride: 105 mEq/L (ref 96–112)
Creat: 0.63 mg/dL (ref 0.50–1.10)
Potassium: 3.7 mEq/L (ref 3.5–5.3)

## 2012-07-19 LAB — GLUCOSE, CAPILLARY: Glucose-Capillary: 83 mg/dL (ref 70–99)

## 2012-07-19 NOTE — Patient Instructions (Signed)
Please start taking a multivitamin and a Vitamin C supplement.  Also, you can try over the counter dry mouth wash.    Please keep a log of your blood pressures, and make an appointment to see Dr. Cristal Ford in one month.

## 2012-07-22 DIAGNOSIS — K141 Geographic tongue: Secondary | ICD-10-CM | POA: Insufficient documentation

## 2012-07-22 NOTE — Assessment & Plan Note (Signed)
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

## 2012-07-22 NOTE — Assessment & Plan Note (Signed)
Orthostatics negative here in clinic, blood sugar is normal.  Suspect this is more likely to be related to BP, but will check BMET and CBC.

## 2012-07-22 NOTE — Assessment & Plan Note (Signed)
Reassured her- have asked her to take vitamin supplements and to use over the counter dry mouth wash.

## 2012-07-22 NOTE — Progress Notes (Signed)
  Subjective:    Patient ID: [name], female    DOB: October 22, 1961, 51 y.o.   MRN: [id number]  HPI  [name] comes in today with several complaints.   She has been having dizzy episodes for the past few days.  She has had these before.  She denies true vertigo, describes light headedness.  She denies vision changes or nausea/vomiting.  She also has headaches with these episodes.  She has thought maybe her blood pressure was running high when this happened. She denies chest pain or palpitations. She has anemia, and says she is supposed to take iron supplements but does not always remember.   Mouth- she has had white spots on her tongue for several months.  She has been treated with fluconazole and nystatin, which has not helped.  She describes a strange feeling in her mouth, and complains of dry mouth.  Past Medical History  Diagnosis Date  . Hypertension   . Sarcoidosis STABLE PER CXR JUNE 2013  . Long-term current use of steroids SYMBICORT INHALER  . Allergic rhinitis   . HTN (hypertension)   . Iron deficiency anemia   . COPD, mild FOLLOWED BY DR [name]  . Sickle cell trait   . 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 Hyperlipidemia Mother   . Asthma Mother   . Lupus Mother   . Hypertension Father   . Hyperlipidemia Father   . Hypertension Sister   . Hypertension Brother   . Cancer Neg Hx   . Diabetes Neg Hx   . Coronary artery disease Neg Hx    History  Substance Use Topics  . Smoking status: Former Smoker -- 1.0 packs/day for 20 years    Quit date: 11/14/2003  . Smokeless tobacco: Never Used  . 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 Lungs: clear to auscultation  bilaterally Heart: regular rate and rhythm, S1, S2 normal, no murmur, click, rub or gallop Extremities: extremities normal, atraumatic, no cyanosis or edema Neurologic: Cranial nerves: normal       Assessment & Plan:

## 2012-07-22 NOTE — Assessment & Plan Note (Signed)
First reading was elevated, but orthostatics negative, and normotensive.  Will ask her to keep log of BP's and take BP if dizzy, f/u next week with Dr. Cristal Ford.

## 2012-07-24 ENCOUNTER — Encounter: Payer: Self-pay | Admitting: Family Medicine

## 2012-07-30 ENCOUNTER — Ambulatory Visit: Payer: BC Managed Care – PPO | Admitting: Internal Medicine

## 2012-08-13 ENCOUNTER — Encounter: Payer: BC Managed Care – PPO | Admitting: Internal Medicine

## 2012-08-13 NOTE — Progress Notes (Signed)
 This encounter was created in error - please disregard.

## 2012-08-22 ENCOUNTER — Ambulatory Visit (INDEPENDENT_AMBULATORY_CARE_PROVIDER_SITE_OTHER): Payer: Self-pay | Admitting: Family Medicine

## 2012-08-22 ENCOUNTER — Encounter: Payer: Self-pay | Admitting: Family Medicine

## 2012-08-22 VITALS — BP 165/87 | HR 80 | Temp 99.5°F | Wt 174.0 lb

## 2012-08-22 DIAGNOSIS — N898 Other specified noninflammatory disorders of vagina: Secondary | ICD-10-CM

## 2012-08-22 DIAGNOSIS — N760 Acute vaginitis: Secondary | ICD-10-CM

## 2012-08-22 DIAGNOSIS — B9689 Other specified bacterial agents as the cause of diseases classified elsewhere: Secondary | ICD-10-CM

## 2012-08-22 DIAGNOSIS — D869 Sarcoidosis, unspecified: Secondary | ICD-10-CM

## 2012-08-22 DIAGNOSIS — L293 Anogenital pruritus, unspecified: Secondary | ICD-10-CM

## 2012-08-22 DIAGNOSIS — I10 Essential (primary) hypertension: Secondary | ICD-10-CM

## 2012-08-22 DIAGNOSIS — Z23 Encounter for immunization: Secondary | ICD-10-CM

## 2012-08-22 DIAGNOSIS — A499 Bacterial infection, unspecified: Secondary | ICD-10-CM

## 2012-08-22 LAB — POCT WET PREP (WET MOUNT)

## 2012-08-22 MED ORDER — FLUCONAZOLE 150 MG PO TABS: 150.0000 mg | ORAL_TABLET | Freq: Once | ORAL | Status: DC

## 2012-08-22 NOTE — Patient Instructions (Signed)
Nice to see you. Lets treat for yeast as the cause of itching.  Start taking hydrochlorothiazide every day. IF your BP stays over 140/90, will need another medication. Make appointment for check up in 1-2 months. Make sure to get the orange card application, this will help financially.

## 2012-08-23 NOTE — Assessment & Plan Note (Signed)
Elevated today without taking her medication for 2 days. Seems to have been controlled earlier in the month with monotherapy. Advised that she should take this daily for best results. Discussed followup in one to 2 months, consider addition of calcium channel blocker if this remains elevated.

## 2012-08-23 NOTE — Assessment & Plan Note (Signed)
She is off prednisone currently and doing well with inhalers. Patient to apply for orange card and hopefully may continue being followed by pulmonology clinic.

## 2012-08-23 NOTE — Assessment & Plan Note (Signed)
She did have clue cells and a history of recurrent BV. His typical symptoms arise, we discussed using prophylactic MetroGel weekly in the future.

## 2012-08-23 NOTE — Progress Notes (Signed)
  Subjective:    Patient ID: [name], female    DOB: Feb 18, 1961, 51 y.o.   MRN: [id number]  HPI  1. HTN. Recently control had improved since she started taking 25 mg hydrochlorothiazide. Unfortunately she has been taking this as needed and she checks her blood pressure early in the morning time when it is low she does not take the medication. Has not taken in 2 days and now BP is elevated. She denies any side effects of the medication. Denies any headaches, vision changes, edema, exertional dyspnea. BP Readings from Last 3 Encounters:  08/22/12 165/87  07/19/12 122/84  06/28/12 130/68   2. vaginal itching. Patient thinks she has another BV infection. Her only complaint is itching. This comes and goes over the past several weeks. It is a recurrent problem, she is having her blood glucose checked multiple times to rule out diabetes and have been negative. She denies any pelvic pain, odor, abdominal pain, dysuria, vaginal discharge, vaginal bleeding. She is postmenopausal.   3. COPD/sarcoidosis. Patient thinks her symptoms are controlled currently on her inhaler medications. Unfortunately she lost her insurance recently therefore had to no show her pulmonology appointment. She does continue plan of intermittent thrush, though no lesions in her mouth.  Review of Systems See HPI otherwise negative.  reports that she quit smoking about 8 years ago. She has never used smokeless tobacco.     Objective:   Physical Exam  Vitals reviewed. Constitutional: She is oriented to person, place, and time. She appears well-developed and well-nourished. No distress.  HENT:  Head: Normocephalic and atraumatic.  Mouth/Throat: Oropharynx is clear and moist.  Neck: Normal range of motion. Neck supple. No thyromegaly present.  Cardiovascular: Normal rate, regular rhythm and normal heart sounds.   No murmur heard. Pulmonary/Chest: Effort normal and breath sounds normal. No respiratory distress. She has no  wheezes. She has no rales.  Abdominal: Soft. Bowel sounds are normal. She exhibits no distension. There is no tenderness.  Genitourinary: Vagina normal and uterus normal.       Mild dry skin on labia majora. No erythema, ulcerations, lymphadenopathy, lesions noted.  Musculoskeletal: She exhibits no edema and no tenderness.  Neurological: She is alert and oriented to person, place, and time.  Skin: No rash noted. She is not diaphoretic.  Psychiatric: She has a normal mood and affect.        Assessment & Plan:

## 2012-08-23 NOTE — Assessment & Plan Note (Signed)
Wet prep shows only clue cells, though clinically does not sound like BP. Will give one-time fluconazole treatment in case this is another yeast infection. Advised patient that she may benefit from moisturizing in avoiding any triggers for atopic reaction in this area. Followup if not improved.

## 2012-08-30 ENCOUNTER — Encounter: Payer: Self-pay | Admitting: Internal Medicine

## 2012-08-30 ENCOUNTER — Ambulatory Visit (INDEPENDENT_AMBULATORY_CARE_PROVIDER_SITE_OTHER): Payer: Self-pay | Admitting: Internal Medicine

## 2012-08-30 VITALS — BP 124/90 | HR 83 | Temp 98.3°F | Ht 65.0 in | Wt 173.8 lb

## 2012-08-30 DIAGNOSIS — J449 Chronic obstructive pulmonary disease, unspecified: Secondary | ICD-10-CM

## 2012-08-30 MED ORDER — PREDNISONE (PAK) 10 MG PO TABS: ORAL_TABLET | ORAL | Status: DC

## 2012-08-30 MED ORDER — OMEPRAZOLE 20 MG PO CPDR: 20.0000 mg | DELAYED_RELEASE_CAPSULE | Freq: Every day | ORAL | Status: DC

## 2012-08-30 NOTE — Patient Instructions (Addendum)
Whenever you start having problems with your breathing/coughing/wheezing go ahead and increase the prilosec to Take 30- 60 min before your first and last meals of the day   Prednisone 10 mg take  4 each am x 2 days,   2 each am x 2 days,  1 each am x2days and stop   For cough use mucinex   Please schedule a follow up visit in 3 months but call sooner if needed

## 2012-08-30 NOTE — Progress Notes (Signed)
Subjective:     Patient ID: [name], female   DOB: 04-21-1961    MRN: [id number]  Brief patient profile:  51 yobf quit smoking 10/2004 dx by transbronchial biopsy 5/92 with NCG inflammatory change consistent with sarcoid. Since that time she's been off and on prednisone multiple  times with symptoms of coughing dyspnea and skin involvement > weaned off chronic prednisone Feb 2012.   Overall she's been doing much better since she stopped smoking 10/2004 and much better since change to Symbicort 160/4.5 2 two times a day, able to do adl's ok without limitations including some steps.     June 26, 2008 returns for PFT's still prednisone 5mg  one daily plus symbicort 160, no new problems > no change rx    March 02, 2010 6 wk followup with PFT's. Pt states that her breathing is much better. rec add spriva trial.   October 27, 2010 ov @ 5 mg/day cc cough better, breathing better walking up to 2 miles a day on the job . >>rec pred 5mg  1/2 every other day   Stopped prednisone even the qod late Feb 2012 > no flare of symptoms or nausea/fatigue so remained off   05/03/2011 ov/ Sakura Denis no flare of cough or sob off prednisone.  No ocular or articular symptoms, no rash.  rec try off spiriva and just use the proaire prn.  06/05/2011 ov/Allessandra Bernardi worse off spiriva but did not restart it,  Using proaire daily then sick x 3-4 days with green sputum and increased need for proaire for sob. Not using hfa optimally. Assoc with nasal congestion and ear pain bilaterally. No longer using ppi but denies overt HB  rec approp use of nasal steroids reviewed Prednisone 10 mg take  4 each am x 2 days,   2 each am x 2 days,  1 each am x2days and stop Zpack called in Work on inhaler technique:   Leave off spiriva for now  Try prilosec 20mg   Take 30-60 min before first meal of the day and Pepcid 20 mg one bedtime  06/29/2011 f/u ov/Geary Rufo cc variable sob esp in am but no longer green sputum, sinuses feel clear.  Back on  prednisone since 8/15 for predominantly rhinitis flare. No more cough and overall back to baseline x occ sob not reproducible with exercise. rec Pepcid 20 mg one at bedtime GERD diet     08/01/2011 f/u ov/Vera Furniss cc breathing the best it's been in a long time. No limitations or cough or daytime saba rescue >no changes   08/24/2011 Acute OV  PT complains of wheezing x 2 days.  sob with activity x 2 days--no cough but some sneezing. Some drainage at times. No fever or discolored mucus. Does not take symbicort on regular basis.  No otc used. No chest pain or edema.   rec Remain on Symbicort regularly everyday w/ no missed doses.  Prednisone taper over next week.  Please contact office for sooner follow up if symptoms do not improve or worsen or seek emergency care  follow up Dr. [name]  In 6 weeks> did not make appt  01/15/2012 f/u ov/Sary Bogie cc Pt c/o prod cough with thick, green sputum x 1 wk. She also states has slight increase SOB and chest tightness. Called in zpak and prednisone > Using rescue q 4-5 hours but improving vs onset and plans tooth extraction on 3/7. No fever or lateralizing cp, sputum turning llighter. rec Inhaler technique poor > reviewed   04/24/2012 f/u ov/Lorry Furber  cc sore mouth x weeks, indolent ponset/ persistent, given diflucan pill x one minimally better,  No sob/ cough.  rec Diflucan 100 mg daily x 7 days - if mouth not better see Dr Warren Danes Reduce symbicort 160 to 2 puffs each am but increase back to twice daily if breathing or coughing or need for rescue inhaler worsen Work on inhaler technique   06/28/2012 f/u ov/Tyrique Sporn cc pt c/o wheezing, and increased SOB x 48month since eye surg.. pt states that she is having to use her HFA more frequently > up to twice daily.  No unusual cough, purulent sputum or sinus/hb symptoms on present rx. Throat feels irritated since put to sleep for eye surgery and not using ppi as rec.  rec Restart prilosec (omeprazole) Take 30- 60 min before  your first and last meals of the day  Prednisone 10 mg take  4 each am x 2 days,   2 each am x 2 days,  1 each am x2days and stop  GERD diet  08/13/2012 f/u ov/Kraven Calk cc missed appt  08/30/2012 f/u ov/Danisha Brassfield cc better since last ov until 2 days prior to OV  With increase wheeze and cough and not using ppi qd ac. No obvious daytime variabilty or assoc chronic cough or cp or chest tightness,   overt sinus or hb symptoms. No unusual exp hx. Some better p saba   Sleeping ok without nocturnal  or early am exacerbation  of respiratory  c/o's or need for noct saba. Also denies any obvious fluctuation of symptoms with weather or environmental changes or other aggravating or alleviating factors except as outlined above   ROS  At present neg for  any significant sore throat, dysphagia, itching, sneezing,  nasal congestion or excess/ purulent secretions,  fever, chills, sweats, unintended wt loss, pleuritic or exertional cp, hempoptysis, orthopnea pnd or leg swelling.  Also denies presyncope, palpitations, heartburn, abdominal pain, nausea, vomiting, diarrhea  or change in bowel or urinary habits, dysuria,hematuria,  rash, arthralgias, visual complaints, headache, numbness weakness or ataxia.        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 Marga Melnick  -h/o Daily prednisone since 2007   > off completely Feb 2012 with no problem ? 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 (ICD-280.9)  Health Maintenance.......................................................   [facility]   Family History:  crohn`s in daughter, M-lupus, htn, asthma,  no Ca, DM, CAD   Social History:   lives with twin children and grandson. single; >20 pack year history, quit in 2005; no EtOH  Laid off  from works for [employer] Sept 2013              Objective:   Physical Exam wt 166 Oct 12, 2009 >170 October 28, 2010 > 169 06/29/2011 >>171 08/24/2011 > 01/15/2012  171 > 04/24/2012 176 > 06/28/2012  175> 08/13/2012 >  08/30/2012  173 amb bf nad  HEENT mild turbinate edema. Oropharynx clear  but no excess pnd or cobblestoning. No JVD or cervical adenopathy. Mild accessory muscle hypertrophy. Trachea midline, nl thryroid. Lungs good air movement no wheeze.  Regular rate and rhythm without murmur gallop or rub or increase P2 or edema. Abd: no hsm, nl excursion. Ext warm without cyanosis or clubbing.   CXR  04/11/12 Stable chronic change. No active lung disease.       Assessment:         Plan:

## 2012-08-31 NOTE — Assessment & Plan Note (Signed)
-   PFT's 06/26/08 FEV1 1.09 ratio  - PFTs 03/02/10 FEV1 1.06 ratio 34    PFT's 08/01/2011 FEV1 1.6 (42%)  41 ratio  DLCO 55 corrects 77 - HFA 75% 01/15/2012  - Spiriva trial March 02, 2010 > better but not worse off it 04/2011  Mild flare of copd ? Related to non-adherence to ppi rx rec  Prednisone 10 mg take  4 each am x 2 days,   2 each am x 2 days,  1 each am x2days and stop

## 2012-10-08 ENCOUNTER — Other Ambulatory Visit (HOSPITAL_COMMUNITY): Payer: Self-pay

## 2012-10-08 ENCOUNTER — Ambulatory Visit (HOSPITAL_COMMUNITY): Payer: Self-pay | Attending: Family Medicine

## 2012-10-08 ENCOUNTER — Ambulatory Visit (HOSPITAL_COMMUNITY)
Admission: RE | Admit: 2012-10-08 | Discharge: 2012-10-08 | Disposition: A | Discharge status: Home / Self Care | Payer: Self-pay | Source: Ambulatory Visit | Attending: Family Medicine | Admitting: Family Medicine

## 2012-10-08 ENCOUNTER — Ambulatory Visit (INDEPENDENT_AMBULATORY_CARE_PROVIDER_SITE_OTHER): Payer: Self-pay | Admitting: Family Medicine

## 2012-10-08 ENCOUNTER — Encounter: Payer: Self-pay | Admitting: Family Medicine

## 2012-10-08 VITALS — BP 131/79 | HR 79 | Temp 98.1°F | Wt 172.0 lb

## 2012-10-08 DIAGNOSIS — R079 Chest pain, unspecified: Secondary | ICD-10-CM

## 2012-10-08 DIAGNOSIS — J029 Acute pharyngitis, unspecified: Secondary | ICD-10-CM

## 2012-10-08 DIAGNOSIS — R0789 Other chest pain: Secondary | ICD-10-CM

## 2012-10-08 DIAGNOSIS — I10 Essential (primary) hypertension: Secondary | ICD-10-CM | POA: Insufficient documentation

## 2012-10-08 MED ORDER — OMEPRAZOLE 20 MG PO CPDR: 20.0000 mg | DELAYED_RELEASE_CAPSULE | Freq: Two times a day (BID) | ORAL | Status: DC

## 2012-10-08 MED ORDER — FAMOTIDINE 40 MG PO TABS: 40.0000 mg | ORAL_TABLET | Freq: Two times a day (BID) | ORAL | Status: DC

## 2012-10-08 NOTE — Patient Instructions (Addendum)
Nice to see you. Your pain is most likely from reflux again. Restart your omeprazole and blood pressure medications (on discount [facility] list). I do not suspect a bacterial infection in your throat. Make appointment in 1-2 weeks for follow up. Make appointment with Britta Mccreedy to get [facility] card for financial assistance.  If you have worsened chest pain, shortness of breath or other concerning symptoms then go to the ER.   Diet for Gastroesophageal Reflux Disease, Adult Reflux (acid reflux) is when acid from your stomach flows up into the esophagus. When acid comes in contact with the esophagus, the acid causes irritation and soreness (inflammation) in the esophagus. When reflux happens often or so severely that it causes damage to the esophagus, it is called gastroesophageal reflux disease (GERD). Nutrition therapy can help ease the discomfort of GERD. FOODS OR DRINKS TO AVOID OR LIMIT  Smoking or chewing tobacco. Nicotine is one of the most potent stimulants to acid production in the gastrointestinal tract.  Caffeinated and decaffeinated coffee and black tea.  Regular or low-calorie carbonated beverages or energy drinks (caffeine-free carbonated beverages are allowed).   Strong spices, such as black pepper, white pepper, red pepper, cayenne, curry powder, and chili powder.  Peppermint or spearmint.  Chocolate.  High-fat foods, including meats and fried foods. Extra added fats including oils, butter, salad dressings, and nuts. Limit these to less than 8 tsp per day.  Fruits and vegetables if they are not tolerated, such as citrus fruits or tomatoes.  Alcohol.  Any food that seems to aggravate your condition. If you have questions regarding your diet, call your caregiver or a registered dietitian. OTHER THINGS THAT MAY HELP GERD INCLUDE:   Eating your meals slowly, in a relaxed setting.  Eating 5 to 6 small meals per day instead of 3 large meals.  Eliminating food for a period of  time if it causes distress.  Not lying down until 3 hours after eating a meal.  Keeping the head of your bed raised 6 to 9 inches (15 to 23 cm) by using a foam wedge or blocks under the legs of the bed. Lying flat may make symptoms worse.  Being physically active. Weight loss may be helpful in reducing reflux in overweight or obese adults.  Wear loose fitting clothing EXAMPLE MEAL PLAN This meal plan is approximately 2,000 calories based on https://www.bernard.org/ meal planning guidelines. Breakfast   cup cooked oatmeal.  1 cup strawberries.  1 cup low-fat milk.  1 oz almonds. Snack  1 cup cucumber slices.  6 oz yogurt (made from low-fat or fat-free milk). Lunch  2 slice whole-wheat bread.  2 oz sliced [place].  2 tsp mayonnaise.  1 cup blueberries.  1 cup snap peas. Snack  6 whole-wheat crackers.  1 oz string cheese. Dinner   cup brown rice.  1 cup mixed veggies.  1 tsp olive oil.  3 oz grilled fish. Document Released: 10/30/2005 Document Revised: 01/22/2012 Document Reviewed: 09/15/2011 [facility] Patient Information 2013 [place], [place].

## 2012-10-09 DIAGNOSIS — R0789 Other chest pain: Secondary | ICD-10-CM | POA: Insufficient documentation

## 2012-10-09 DIAGNOSIS — J029 Acute pharyngitis, unspecified: Secondary | ICD-10-CM | POA: Insufficient documentation

## 2012-10-09 MED ORDER — HYDROCHLOROTHIAZIDE 25 MG PO TABS: 25.0000 mg | ORAL_TABLET | Freq: Every day | ORAL | Status: DC

## 2012-10-09 NOTE — Assessment & Plan Note (Signed)
Negative rapid strep and no true indication of bacterial infection on exam. Advised supportive care including fluids, salt water gargles and rest. The atypical chest discomfort has no typical or cardiac features and has been absent for many hours with a negative EKG today. I suspect this is related to her being off her PPI and using motrin recently perhaps some pill gastritis/esophagitis. Advised patient to restart either PPI or pepcid (given generic rx which may be cheaper). Advised if she experiences any worsening symptoms, fever, dyspnea or return of chest pain to seek emergency care or to call MD.

## 2012-10-09 NOTE — Assessment & Plan Note (Signed)
Resolved spontaneously after 2 hours, non-exertional or related to rest.  TIMI score is 0, very low risk of cardiac, normal EKG. While off PPI and taking motrin. Most likely a pill esophagitis or gastritis. Discussed avoiding NSAIDs and restart PPI or H2 blocker. Follow up if returns or becomes problematic.

## 2012-10-09 NOTE — Assessment & Plan Note (Signed)
Borderline above goal recently, perhaps worsened with motrin. [facility] as patient states she can afford 4$ list meds. Provided with info to contact Britta Mccreedy and get [facility] card approved for medication assistance. Advised f/u in 1-2 months, sooner if needed.

## 2012-10-09 NOTE — Progress Notes (Signed)
  Subjective:    Patient ID: [name], female    DOB: 1961/02/28, 51 y.o.   MRN: [id number]  HPI  1. Sore throat, ear pain. For past one week patient having sore throat, pain with swallowing and nasal congestion. Symptoms are not worsening, they are stable. She is using motrin for sore throat.  Also notes a mild discomfort in her right ear with swallowing. The reason she finally decided to come in was that she felt a discomfort in her right chest and to her right shouler this morning upon awakening. Felt intermittently for 2 hours then resolved 4-5 hours ago. Was not related to exertion nor relieved by rest. Notably patient has been off her medications because she lost insurance and cannot afford PPI.  Endorses some belching and indigestion recently since she has been off omeprazole. Also notes her BP to fluctuate between 130/70-150/90.  Patient had a previous episode of chest pain in 2012 hospitalized with diagnosis of GI related and resolved with PPI.   Review of Systems She denies any neck/face swelling, vision problems, ear drainage, dyspnea, wheezing, cough, fevers, palpitations, neck pain, active chest pain, leg swelling, nausea, diaphoresis, exertional angina episodes.     Objective:   Physical Exam  Vitals reviewed. Constitutional: She is oriented to person, place, and time. She appears well-developed and well-nourished. No distress.  HENT:  Head: Normocephalic and atraumatic.  Right Ear: External ear normal.  Left Ear: External ear normal.  Mouth/Throat: Oropharynx is clear and moist.       Mild nasal congestion and hoarseness. Slight pharyngeal irritation, no exudates. No LAD. Bilateral TM are normal, no pain in manipulation of pinna or facial tenderness.  No purulent drainage or sinus TTP.  Eyes: Conjunctivae normal and EOM are normal. Right eye exhibits no discharge. Left eye exhibits no discharge.  Neck: Normal range of motion. Neck supple. No thyromegaly present.    Cardiovascular: Normal rate, regular rhythm and normal heart sounds.  Exam reveals no gallop.   No murmur heard. Pulmonary/Chest: Effort normal and breath sounds normal. No respiratory distress. She has no wheezes. She has no rales. She exhibits no tenderness.  Abdominal: Soft. Bowel sounds are normal. She exhibits no distension. There is no tenderness. There is no rebound and no guarding.  Musculoskeletal: She exhibits no edema and no tenderness.  Lymphadenopathy:    She has no cervical adenopathy.  Neurological: She is alert and oriented to person, place, and time.  Skin: No rash noted. She is not diaphoretic.  Psychiatric: She has a normal mood and affect.   EKG: normal EKG 67 bpm, normal sinus rhythm, no ischemic changes. Borderline PR 188.      Assessment & Plan:

## 2012-10-16 ENCOUNTER — Encounter: Payer: Self-pay | Admitting: *Deleted

## 2012-10-16 NOTE — Progress Notes (Signed)
[facility]  Clinical Social Work  Clinical Social Work met with patient and patient's mother (who is a patient at cancer center) in CSW office to complete healthcare advance directives.  [name] designated her daughter, [name], as her primary healthcare agent.  [name] also completed her healthcare living will and expressed that she would not like life-prolonging measures in the scenarios indicated in the document.  CSW will send documents to medical records to be scanned into patient chart.  To access these documents, go to Chart Review >Media Tab > look under Advance Directives.  CSW encouraged patient to contact CSW with any additional questions or concerns.  Kathrin Penner, MSW, LCSW Clinical Social Worker [facility] [phone]

## 2012-10-22 ENCOUNTER — Ambulatory Visit (INDEPENDENT_AMBULATORY_CARE_PROVIDER_SITE_OTHER): Payer: Self-pay | Admitting: Family Medicine

## 2012-10-22 ENCOUNTER — Encounter: Payer: Self-pay | Admitting: Family Medicine

## 2012-10-22 VITALS — BP 110/70 | HR 90 | Temp 98.9°F | Ht 65.0 in | Wt 170.9 lb

## 2012-10-22 DIAGNOSIS — J329 Chronic sinusitis, unspecified: Secondary | ICD-10-CM

## 2012-10-22 DIAGNOSIS — R0789 Other chest pain: Secondary | ICD-10-CM

## 2012-10-22 DIAGNOSIS — I10 Essential (primary) hypertension: Secondary | ICD-10-CM

## 2012-10-22 MED ORDER — AMOXICILLIN 500 MG PO CAPS: 500.0000 mg | ORAL_CAPSULE | Freq: Three times a day (TID) | ORAL | Status: DC

## 2012-10-22 MED ORDER — FLUCONAZOLE 150 MG PO TABS: 150.0000 mg | ORAL_TABLET | Freq: Once | ORAL | Status: DC

## 2012-10-22 NOTE — Assessment & Plan Note (Signed)
Not bothersome today. Will try to get OTC acid suppression when money comes in.

## 2012-10-22 NOTE — Patient Instructions (Addendum)
You may have a bacterial infection that I cannot see. [facility]. Use diflucan if needed. Will make appointment with Britta Mccreedy for orange card. This will help with medications at health department. If you have fever, do not improve, have trouble swallowing or any concerns then call MD and schedule visit. If you have trouble breathing, go to ER. You can try OTC pepcid (famotidine) or omeprazole (prilosec) for generic also.

## 2012-10-22 NOTE — Assessment & Plan Note (Signed)
Improved control on HCTZ.

## 2012-10-22 NOTE — Assessment & Plan Note (Signed)
Throat irritation, green drainage and hoarseness now >14 days. Rapid strep negative last week. No indication of abscess formation or pulmonary disease. Will treat for bacterial sinusitis with amoxicillin. Patient uninsured and cannot afford augmentin currently. Advised continued supportive care also, nasal saline, anti-histamine. RTC if not improving in next few days or worsening, has dyspnea, fever, facial pain.  She is working on applying for orange card-to make appt with Britta Mccreedy.

## 2012-10-22 NOTE — Progress Notes (Signed)
  Subjective:    Patient ID: [name], female    DOB: 1961/02/29, 51 y.o.   MRN: [id number]  HPI  1. Throat pain. Going on over >14 days currently, feels irritation in right throat with swallowing that radiates to right ear, green sinus drainage currently. No improvement. She found an old amoxicillin rx and took 3 pills which seemed to improve slightly. She has noted small amount of wheezing intermittently, but no dyspnea. Has sarcoidosis and requires frequent prednisone bursts, does not feel like current symptoms warrant prednisone today. Denies fever, chills, chest pain, eye pain, temporal pain, abd pain, nausea.  2. GERD. Has not been able to afford rx for acid therapy. Is exploring option for OTC meds and waiting for orange card to come through.   Review of Systems See HPI otherwise negative.  reports that she quit smoking about 8 years ago. She has never used smokeless tobacco.     Objective:   Physical Exam  Vitals reviewed. Constitutional: She is oriented to person, place, and time. She appears well-developed. No distress.  HENT:  Head: Normocephalic and atraumatic.  Right Ear: External ear normal.  Left Ear: External ear normal.  Nose: Nose normal.  Mouth/Throat: Oropharynx is clear and moist. No oropharyngeal exudate.       Slight tonsillar hypertrophy. B TMs wnl. No dullness or erythema. No pain with manipulation of pinna. Ext canal wnl. No LAD or swelling noted.  NO sinus TTP. Slight hoarseness detected. No stridor or distress. No temporal TTP.  Eyes: EOM are normal. Pupils are equal, round, and reactive to light.       Slightly injected bilateral conj.  Neck: Normal range of motion. Neck supple. No thyromegaly present.  Cardiovascular: Normal rate, regular rhythm and normal heart sounds.   Pulmonary/Chest: Effort normal and breath sounds normal. No respiratory distress. She has no wheezes. She has no rales.  Musculoskeletal: She exhibits no edema.  Lymphadenopathy:     She has no cervical adenopathy.  Neurological: She is alert and oriented to person, place, and time.  Skin: No rash noted. She is not diaphoretic.  Psychiatric: She has a normal mood and affect.       Assessment & Plan:

## 2012-10-29 ENCOUNTER — Telehealth: Payer: Self-pay | Admitting: Internal Medicine

## 2012-10-29 NOTE — Telephone Encounter (Signed)
Pt aware we do not have any samples at this time. Nothing further was needed

## 2012-11-28 ENCOUNTER — Ambulatory Visit (INDEPENDENT_AMBULATORY_CARE_PROVIDER_SITE_OTHER): Payer: Self-pay | Admitting: Internal Medicine

## 2012-11-28 ENCOUNTER — Encounter: Payer: Self-pay | Admitting: Internal Medicine

## 2012-11-28 VITALS — BP 110/70 | HR 86 | Temp 98.1°F | Ht 65.0 in | Wt 170.6 lb

## 2012-11-28 DIAGNOSIS — D869 Sarcoidosis, unspecified: Secondary | ICD-10-CM

## 2012-11-28 DIAGNOSIS — J449 Chronic obstructive pulmonary disease, unspecified: Secondary | ICD-10-CM

## 2012-11-28 MED ORDER — BUDESONIDE-FORMOTEROL FUMARATE 160-4.5 MCG/ACT IN AERO: 2.0000 | INHALATION_SPRAY | Freq: Two times a day (BID) | RESPIRATORY_TRACT | Status: DC

## 2012-11-28 NOTE — Assessment & Plan Note (Addendum)
-   PFT's 06/26/08 FEV1 1.09 ratio  - PFTs 03/02/10 FEV1 1.06 ratio 34    PFT's 08/01/2011 FEV1 1.6 (42%)  41 ratio  DLCO 55 corrects 77 - HFA 75% 01/15/2012  - Spiriva trial March 02, 2010 > better but not worse off it 04/2011  GOLD III with tendency to flare of ab related to poorly controlled rhinitis > rx prednisone x 6 days and restart nasal steroids (samples given until can afford it)    Each maintenance medication was reviewed in detail including most importantly the difference between maintenance and as needed and under what circumstances the prns are to be used.  Please see instructions for details which were reviewed in writing and the patient given a copy.    F/u can be through [facility] practice with pulmonary f/u prn

## 2012-11-28 NOTE — Assessment & Plan Note (Signed)
Positive transbronchial biopsy 02/19/91 by Dr. Marga Melnick  -Daily prednisone since 2007 with ? adrenal insufficiency iatrogenic > off completely Feb 2012 with no problem - PFT's 06/26/08 FEV1  1.09 ratio  - PFTs  03/02/10 FEV1  1.06 (42%) ratio 34 and no better p B2,  DLC0 48% corrects to 64% - PFT's 08/01/2011 FEV1 1.6 (42%)  41 ratio  DLCO 55 corrects 77 - HFA  90% 04/24/2012   No evidence of active dz off pred since Feb 2012 so pulmonary f/u prn

## 2012-11-28 NOTE — Progress Notes (Signed)
Subjective:     Patient ID: [name], female   DOB: 1961-09-19    MRN: [id number]  Brief patient profile:  51 yobf quit smoking 10/2004 dx by transbronchial biopsy 5/92 with NCG inflammatory change consistent with sarcoid. Since that time she's been off and on prednisone multiple  times with symptoms of coughing dyspnea and skin involvement > weaned off chronic prednisone Feb 2012.   Overall she's been doing much better since she stopped smoking 10/2004 and much better since change to Symbicort 160/4.5 2 two times a day, able to do adl's ok without limitations including some steps.     June 26, 2008 returns for PFT's still prednisone 5mg  one daily plus symbicort 160, no new problems > no change rx    March 02, 2010 6 wk followup with PFT's. Pt states that her breathing is much better. rec add spriva trial.   October 27, 2010 ov @ 5 mg/day cc cough better, breathing better walking up to 2 miles a day on the job . >>rec pred 5mg  1/2 every other day   Stopped prednisone even the qod late Feb 2012 > no flare of symptoms or nausea/fatigue so remained off   05/03/2011 ov/ [name] no flare of cough or sob off prednisone.  No ocular or articular symptoms, no rash.  rec try off spiriva and just use the proaire prn.  06/05/2011 ov/[name] worse off spiriva but did not restart it,  Using proaire daily then sick x 3-4 days with green sputum and increased need for proaire for sob. Not using hfa optimally. Assoc with nasal congestion and ear pain bilaterally. No longer using ppi but denies overt HB  rec approp use of nasal steroids reviewed Prednisone 10 mg take  4 each am x 2 days,   2 each am x 2 days,  1 each am x2days and stop Zpack called in Work on inhaler technique:   Leave off spiriva for now  Try prilosec 20mg   Take 30-60 min before first meal of the day and Pepcid 20 mg one bedtime  06/29/2011 f/u ov/Casaundra Takacs cc variable sob esp in am but no longer green sputum, sinuses feel clear.  Back on  prednisone since 8/15 for predominantly rhinitis flare. No more cough and overall back to baseline x occ sob not reproducible with exercise. rec Pepcid 20 mg one at bedtime GERD diet     08/01/2011 f/u ov/[name] cc breathing the best it's been in a long time. No limitations or cough or daytime saba rescue >no changes   08/24/2011 Acute OV  PT complains of wheezing x 2 days.  sob with activity x 2 days--no cough but some sneezing. Some drainage at times. No fever or discolored mucus. Does not take symbicort on regular basis.  No otc used. No chest pain or edema.   rec Remain on Symbicort regularly everyday w/ no missed doses.  Prednisone taper over next week.  Please contact office for sooner follow up if symptoms do not improve or worsen or seek emergency care  follow up Dr. [name]  In 6 weeks> did not make appt  01/15/2012 f/u ov/[name] cc Pt c/o prod cough with thick, green sputum x 1 wk. She also states has slight increase SOB and chest tightness. Called in zpak and prednisone > Using rescue q 4-5 hours but improving vs onset and plans tooth extraction on 3/7. No fever or lateralizing cp, sputum turning llighter. rec Inhaler technique poor > reviewed   04/24/2012 f/u ov/[name]  cc sore mouth x weeks, indolent ponset/ persistent, given diflucan pill x one minimally better,  No sob/ cough.  rec Diflucan 100 mg daily x 7 days - if mouth not better see Dr Warren Danes Reduce symbicort 160 to 2 puffs each am but increase back to twice daily if breathing or coughing or need for rescue inhaler worsen Work on inhaler technique   06/28/2012 f/u ov/[name] cc pt c/o wheezing, and increased SOB x 49month since eye surg.. pt states that she is having to use her HFA more frequently > up to twice daily.  No unusual cough, purulent sputum or sinus/hb symptoms on present rx. Throat feels irritated since put to sleep for eye surgery and not using ppi as rec.  rec Restart prilosec (omeprazole) Take 30- 60 min before  your first and last meals of the day  Prednisone 10 mg take  4 each am x 2 days,   2 each am x 2 days,  1 each am x2days and stop  GERD diet  08/13/2012 f/u ov/[name] cc missed appt  08/30/2012 f/u ov/[name] cc better since last ov until 2 days prior to OV  With increase wheeze and cough and not using ppi qd ac. rec Whenever you start having problems with your breathing/coughing/wheezing go ahead and increase the prilosec to Take 30- 60 min before your first and last meals of the day  Prednisone 10 mg take  4 each am x 2 days,   2 each am x 2 days,  1 each am x2days and stop  For cough use mucinex    11/28/2012 f/u ov/[name] cc Worse sob  x 3 days with wheezing and cough-occ prod with yellow sputum assoc with nasal congestion off flonase cause can't afford it, filing for disability.   No obvious daytime variabilty or assoc cp or chest tightness,   overt sinus or hb symptoms. No unusual exp hx. Some better p saba   Sleeping ok without nocturnal  or early am exacerbation  of respiratory  c/o's or need for noct saba. Also denies any obvious fluctuation of symptoms with weather or environmental changes or other aggravating or alleviating factors except as outlined above   ROS  The following are not active complaints unless bolded sore throat, dysphagia, dental problems, itching, sneezing,  nasal congestion worse off flonase or excess/ purulent secretions, ear ache,   fever, chills, sweats, unintended wt loss, pleuritic or exertional cp, hemoptysis,  orthopnea pnd or leg swelling, presyncope, palpitations, heartburn, abdominal pain, anorexia, nausea, vomiting, diarrhea  or change in bowel or urinary habits, change in stools or urine, dysuria,hematuria,  rash, arthralgias, visual complaints, headache, numbness weakness or ataxia or problems with walking or coordination,  change in mood/affect or memory.            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 Marga Melnick  -h/o Daily prednisone since 2007   > off completely Feb 2012 with no problem ? 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 (ICD-280.9)  Health Maintenance.......................................................   [facility]   Family History:  crohn`s in daughter, M-lupus, htn, asthma,  no Ca, DM, CAD   Social History:   lives with twin children and grandson. single; >20 pack year history, quit in 2005; no EtOH  Laid off  from works for [employer] Sept 2013              Objective:   Physical Exam wt 166 Oct 12, 2009 >170 October 28, 2010 > 169 06/29/2011 >>171 08/24/2011 > 01/15/2012  171 > 04/24/2012 176 > 06/28/2012  175> 08/13/2012 > 08/30/2012  173 > 170 11/28/2012  amb bf nad  HEENT mild turbinate edema. Oropharynx clear  but no excess pnd or cobblestoning. No JVD or cervical adenopathy. Mild accessory muscle hypertrophy. Trachea midline, nl thryroid. Lungs good air movement no wheeze.  Regular rate and rhythm without murmur gallop or rub or increase P2 or edema. Abd: no hsm, nl excursion. Ext warm without cyanosis or clubbing.   CXR  04/11/12 Stable chronic change. No active lung disease.       Assessment:         Plan:

## 2012-11-28 NOTE — Patient Instructions (Addendum)
Whenever you start having problems with your breathing/coughing/wheezing go ahead and increase the prilosec to Take 30- 60 min before your first and last meals of the day   Prednisone 10 mg take  4 each am x 2 days,   2 each am x 2 days,  1 each am x2days and stop   For cough use mucinex  Or mucinex dm  Please see patient coordinator before you leave today for [employer] assistance for Symbicort 160  Return as needed to pulmonary clinic

## 2012-12-03 ENCOUNTER — Telehealth: Payer: Self-pay | Admitting: Internal Medicine

## 2012-12-03 MED ORDER — PREDNISONE 10 MG PO TABS: ORAL_TABLET | ORAL | Status: DC

## 2012-12-03 NOTE — Telephone Encounter (Signed)
Pt aware that her prescription has been sent in. I apologized for any inconvenience.

## 2012-12-19 ENCOUNTER — Emergency Department (HOSPITAL_COMMUNITY): Payer: Self-pay

## 2012-12-19 ENCOUNTER — Encounter (HOSPITAL_COMMUNITY): Payer: Self-pay | Admitting: Cardiology

## 2012-12-19 ENCOUNTER — Observation Stay (HOSPITAL_COMMUNITY)
Admission: EM | Admit: 2012-12-19 | Discharge: 2012-12-21 | Disposition: A | Discharge status: Home / Self Care | Payer: Self-pay | Attending: Family Medicine | Admitting: Family Medicine

## 2012-12-19 ENCOUNTER — Telehealth: Payer: Self-pay | Admitting: Family Medicine

## 2012-12-19 DIAGNOSIS — R0602 Shortness of breath: Secondary | ICD-10-CM | POA: Insufficient documentation

## 2012-12-19 DIAGNOSIS — D573 Sickle-cell trait: Secondary | ICD-10-CM | POA: Insufficient documentation

## 2012-12-19 DIAGNOSIS — Z87891 Personal history of nicotine dependence: Secondary | ICD-10-CM | POA: Insufficient documentation

## 2012-12-19 DIAGNOSIS — E785 Hyperlipidemia, unspecified: Secondary | ICD-10-CM | POA: Insufficient documentation

## 2012-12-19 DIAGNOSIS — R110 Nausea: Secondary | ICD-10-CM | POA: Insufficient documentation

## 2012-12-19 DIAGNOSIS — K219 Gastro-esophageal reflux disease without esophagitis: Secondary | ICD-10-CM | POA: Insufficient documentation

## 2012-12-19 DIAGNOSIS — J449 Chronic obstructive pulmonary disease, unspecified: Secondary | ICD-10-CM

## 2012-12-19 DIAGNOSIS — J4489 Other specified chronic obstructive pulmonary disease: Secondary | ICD-10-CM | POA: Insufficient documentation

## 2012-12-19 DIAGNOSIS — D869 Sarcoidosis, unspecified: Secondary | ICD-10-CM | POA: Insufficient documentation

## 2012-12-19 DIAGNOSIS — R079 Chest pain, unspecified: Principal | ICD-10-CM | POA: Insufficient documentation

## 2012-12-19 DIAGNOSIS — R911 Solitary pulmonary nodule: Secondary | ICD-10-CM

## 2012-12-19 DIAGNOSIS — R61 Generalized hyperhidrosis: Secondary | ICD-10-CM | POA: Insufficient documentation

## 2012-12-19 DIAGNOSIS — I10 Essential (primary) hypertension: Secondary | ICD-10-CM | POA: Insufficient documentation

## 2012-12-19 DIAGNOSIS — R0789 Other chest pain: Secondary | ICD-10-CM

## 2012-12-19 HISTORY — DX: Shortness of breath: R06.02

## 2012-12-19 LAB — BASIC METABOLIC PANEL
BUN: 10 mg/dL (ref 6–23)
CO2: 33 mEq/L — ABNORMAL HIGH (ref 19–32)
Calcium: 9.5 mg/dL (ref 8.4–10.5)
Chloride: 99 mEq/L (ref 96–112)
Creatinine, Ser: 0.7 mg/dL (ref 0.50–1.10)
Glucose, Bld: 94 mg/dL (ref 70–99)

## 2012-12-19 LAB — CBC
HCT: 39.8 % (ref 36.0–46.0)
MCH: 26.9 pg (ref 26.0–34.0)
MCV: 79.9 fL (ref 78.0–100.0)
RBC: 4.98 MIL/uL (ref 3.87–5.11)
WBC: 9.2 10*3/uL (ref 4.0–10.5)

## 2012-12-19 LAB — POCT I-STAT TROPONIN I: Troponin i, poc: 0 ng/mL (ref 0.00–0.08)

## 2012-12-19 MED ORDER — KETOROLAC TROMETHAMINE 30 MG/ML IJ SOLN
30.0000 mg | Freq: Once | INTRAMUSCULAR | Status: AC
  Administered 2012-12-19: 30 mg via INTRAVENOUS
  Filled 2012-12-19: qty 1

## 2012-12-19 MED ORDER — ASPIRIN 325 MG PO TABS
325.0000 mg | ORAL_TABLET | Freq: Once | ORAL | Status: AC
  Administered 2012-12-19: 325 mg via ORAL
  Filled 2012-12-19: qty 1

## 2012-12-19 MED ORDER — NITROGLYCERIN 0.4 MG SL SUBL
0.4000 mg | SUBLINGUAL_TABLET | SUBLINGUAL | Status: DC | PRN
  Administered 2012-12-19: 0.4 mg via SUBLINGUAL

## 2012-12-19 MED ORDER — SODIUM CHLORIDE 0.9 % IV SOLN
INTRAVENOUS | Status: DC
  Administered 2012-12-19: 21:00:00 via INTRAVENOUS

## 2012-12-19 MED ORDER — GI COCKTAIL ~~LOC~~
30.0000 mL | Freq: Once | ORAL | Status: AC
  Administered 2012-12-19: 30 mL via ORAL
  Filled 2012-12-19: qty 30

## 2012-12-19 NOTE — ED Notes (Signed)
Pt reports she woke up with midsternal chest pain, tingling in her left arm and burning in the back of her neck. Also reports a headache. Denies ant SOB but is having some nausea.

## 2012-12-19 NOTE — ED Notes (Signed)
Robet Leu, friend, [phone]

## 2012-12-19 NOTE — ED Provider Notes (Signed)
History     CSN: [id number]  Arrival date & time 12/19/12  1725   First MD Initiated Contact with Patient 12/19/12 2002      Chief Complaint  Patient presents with  . Chest Pain  . Gastrophageal Reflux    (Consider location/radiation/quality/duration/timing/severity/associated sxs/prior treatment) HPI Comments: [name] is a 52 y.o. female with a history of hypertension, GERD, sarcoidosis and 20-pack-year history of smoking presents the emergency department complaining of chest pain.  Onset of symptoms began 2 days ago and were described as an intermittent chest pressure worse while working and lasting approximately 10-15 minutes in duration.  This morning the patient woke up with a worse and constant substernal chest pressure associated shortness of breath, nausea, and diaphoresis at 4 a.m.  Chest pain has not been relieved since.  No known alleviating factors and patient is unaware if exacerbated by positions or exertion.  Pain is not worsened by food.  Chest pressure radiates down left shoulder 2 left hand with tingling.  Patient denies ever being evaluated by a cardiologist in the past before and has not had a stress test, echo or coronary CT imaging.  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  In addition patient reports that this chest pressure is different in comparison to her acid reflux as it does not burn and is unrelieved by antiacid medications.  Patient denies fevers, night sweats, chills, orthopnea, PND, leg swelling, palpitations, syncope.  No known family history of cardiac issues.  The history is provided by the patient.    Past Medical History  Diagnosis Date  . Hypertension   . Sarcoidosis STABLE PER CXR JUNE 2013  . Long-term current use of steroids SYMBICORT INHALER  . Allergic rhinitis   . HTN (hypertension)   . Iron deficiency  anemia   . COPD, mild FOLLOWED BY DR [name]  . Sickle cell trait   . 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 Cesarean section 1986    W/ BILATERAL TUBAL LIGATION  . Total robotic assisted laparoscopic hysterectomy 12-30-2010    SYMPTOMATIC UTERINE FIBROIDS  . Upper teeth extraction's 1992  . Median rectus repair 05/22/2012    Procedure: MEDIAN RECTUS REPAIR;  Surgeon: Corinda Gubler, MD;  Location: [facility];  Service: Ophthalmology;  Laterality: Bilateral;  INFERIOR RECTUS RESECTION WITH ADJUSTIBLE SUTURES RIGHT EYE  CAPIRAR OBLIQUE TUCK OF LEFT EYE  INFERIOR OBLIQUE MYECTOMY LEFT EYE  . Adjustable suture manipulation 05/22/2012    Procedure: ADJUSTABLE SUTURE MANIPULATION;  Surgeon: Corinda Gubler, MD;  Location: [facility];  Service: Ophthalmology;  Laterality: Right;    Family History  Problem Relation Age of Onset  . Hyperlipidemia Mother   . Asthma Mother   . Lupus Mother   . Hypertension Father   . Hyperlipidemia Father   . Hypertension Sister   . Hypertension Brother   . Cancer Neg Hx   . Diabetes Neg Hx   . Coronary artery disease Neg Hx     History  Substance Use Topics  . Smoking status: Former Smoker -- 1.0 packs/day for 20 years    Quit date: 11/14/2003  . Smokeless tobacco: Never Used  . Alcohol Use: No    OB History    Grav Para Term Preterm Abortions TAB SAB Ect Mult  Living                  Review of Systems  Constitutional: Positive for diaphoresis and activity change. Negative for fever, chills, fatigue and unexpected weight change.  HENT: Negative for congestion, neck pain and neck stiffness.   Eyes: Negative for visual disturbance.  Respiratory: Positive for chest tightness and shortness of breath. Negative for apnea, cough, wheezing and stridor.   Cardiovascular: Positive for chest pain. Negative for palpitations and leg swelling.  Gastrointestinal: Positive for nausea. Negative for  vomiting, abdominal pain, diarrhea and blood in stool.  Genitourinary: Negative for dysuria, urgency, hematuria and flank pain.  Musculoskeletal: Negative for myalgias, back pain and gait problem.  Skin: Negative for pallor.  Neurological: Negative for dizziness, syncope, light-headedness and headaches.  All other systems reviewed and are negative.    Allergies  Codeine  Home Medications   Current Outpatient Rx  Name  Route  Sig  Dispense  Refill  . ALBUTEROL SULFATE HFA 108 (90 BASE) MCG/ACT IN AERS   Inhalation   Inhale 2 puffs into the lungs every 4 (four) hours as needed. For shortness of breath or wheezing         . BUDESONIDE-FORMOTEROL FUMARATE 160-4.5 MCG/ACT IN AERO   Inhalation   Inhale 2 puffs into the lungs 2 (two) times daily. 2 puffs each am and 2 each pm   3 Inhaler   3   . FLUTICASONE PROPIONATE 50 MCG/ACT NA SUSP   Nasal   Place 2 sprays into the nose daily as needed. For stuffiness         . HYDROCHLOROTHIAZIDE 25 MG PO TABS   Oral   Take 25 mg by mouth daily.         . IBUPROFEN 200 MG PO TABS   Oral   Take 600 mg by mouth 2 (two) times daily as needed. For pain         . ADULT MULTIVITAMIN W/MINERALS CH   Oral   Take 1 tablet by mouth daily.         Marland Kitchen OMEPRAZOLE 20 MG PO CPDR      Take 30- 60 min before your first meal of the day         . OVER THE COUNTER MEDICATION   Both Eyes   Place 1 drop into both eyes daily as needed. Eye drops for itchy eyes           BP 155/80  Pulse 102  Temp 98.7 F (37.1 C) (Oral)  Resp 18  SpO2 100%  Physical Exam  Nursing note and vitals reviewed. Constitutional: She appears well-developed and well-nourished. No distress.  HENT:  Head: Normocephalic and atraumatic.  Eyes: Conjunctivae normal and EOM are normal. Pupils are equal, round, and reactive to light.  Neck: Normal range of motion. Neck supple. Normal carotid pulses and no JVD present. Carotid bruit is not present. No rigidity.  Normal range of motion present.  Cardiovascular: Normal rate, regular rhythm, S1 normal, S2 normal, normal heart sounds, intact distal pulses and normal pulses.  Exam reveals no gallop and no friction rub.   No murmur heard.      No pitting edema bilaterally, RRR, no aberrant sounds on auscultations, distal pulses intact, no carotid bruit or JVD.   Pulmonary/Chest: Effort normal and breath sounds normal. No accessory muscle usage or stridor. No respiratory distress. She exhibits no tenderness and no bony tenderness.  Abdominal: Bowel sounds are normal.  Soft non tender. Non pulsatile aorta.   Skin: Skin is warm, dry and intact. No rash noted. She is not diaphoretic. No cyanosis. Nails show no clubbing.    ED Course  Procedures (including critical care time)  Labs Reviewed  BASIC METABOLIC PANEL - Abnormal; Notable for the following:    Potassium 3.4 (*)     CO2 33 (*)     All other components within normal limits  CBC  POCT I-STAT TROPONIN I  TROPONIN I   Dg Chest 2 View  12/19/2012  *RADIOLOGY REPORT*  Clinical Data: Chest pain  CHEST - 2 VIEW  Comparison: 04/11/2012  Findings: Stable chronic interstitial markings with bilateral lower lobe scarring.  No focal consolidation. No pleural effusion or pneumothorax.  Heart is normal in size.  Mild degenerative changes of the visualized thoracolumbar spine.  IMPRESSION: No evidence of acute cardiopulmonary disease.  Stable chronic interstitial markings/scarring.   Original Report Authenticated By: Charline Bills, M.D.     No diagnosis found.    MDM  Chest pressure, admission to r/o unstable angina  Patient is a 52 year old who presented to the emergency department complaining of substernal chest pressure associated primarily with nausea that has been gradually worsening and become constant today and associated with diaphoresis. Concern for cardiac etiology of Chest Pain. No known PMHx of cardiac issues and no known fam hx. Risk  factors positive for 20 pack year hx. Family practice consulted and will see patient in the ED for likely admit. Pt does not meet criteria for CP protocol as she is not CP free and a further evaluation is recommended. Pt has been re-evaluated prior to consult and VSS, NAD, heart RRR, lungs CTAB. No ST elevation found on EKG and first round of cardiac enzymes negative. This case was discussed with Dr. Lynelle Doctor who agrees with plan to admit.          Jaci Carrel, [place] 12/19/12 2131

## 2012-12-19 NOTE — Telephone Encounter (Signed)
Patient is concerned that some symptoms she is having might be from elevated BP and she doesn't know what she should do.  She woke up with some chest pain and a burning sensation on the back of her neck.

## 2012-12-19 NOTE — ED Provider Notes (Addendum)
Medical screening examination/treatment/procedure(s) were performed by non-physician practitioner and as supervising physician I was immediately available for consultation/collaboration.   Celene Kras, MD 12/19/12 1610  Celene Kras, MD 12/20/12 [phone]

## 2012-12-19 NOTE — Telephone Encounter (Addendum)
Patient reports for couple of days has experienced some chest discomfort and discomfort in back of neck. . This morning when she woke up she had chest pain. She took BP this AM  but doesn't remember what it was. Not real high she states.  She did  take usual BP medication. Denies any chest pain now. Consulted with Dr. Deirdre Priest and he advises come in tomorrow for evaluation of BP and chest discomfort. but if develops chest pain again needs to go to ED.

## 2012-12-20 ENCOUNTER — Ambulatory Visit: Payer: Self-pay | Admitting: Family Medicine

## 2012-12-20 ENCOUNTER — Encounter (HOSPITAL_COMMUNITY): Payer: Self-pay | Admitting: General Practice

## 2012-12-20 DIAGNOSIS — D869 Sarcoidosis, unspecified: Secondary | ICD-10-CM

## 2012-12-20 DIAGNOSIS — R0789 Other chest pain: Secondary | ICD-10-CM

## 2012-12-20 DIAGNOSIS — J449 Chronic obstructive pulmonary disease, unspecified: Secondary | ICD-10-CM

## 2012-12-20 DIAGNOSIS — I10 Essential (primary) hypertension: Secondary | ICD-10-CM

## 2012-12-20 DIAGNOSIS — R072 Precordial pain: Secondary | ICD-10-CM

## 2012-12-20 DIAGNOSIS — R079 Chest pain, unspecified: Secondary | ICD-10-CM

## 2012-12-20 LAB — TROPONIN I
Troponin I: <0.3 ng/mL (ref <0.30)
Troponin I: <0.3 ng/mL (ref <0.30)
Troponin I: <0.3 ng/mL (ref <0.30)

## 2012-12-20 LAB — TSH: TSH: 1.293 u[IU]/mL (ref 0.350–4.500)

## 2012-12-20 LAB — LIPID PANEL: Total CHOL/HDL Ratio: 3.5 RATIO

## 2012-12-20 LAB — BASIC METABOLIC PANEL
Chloride: 103 mEq/L (ref 96–112)
GFR calc Af Amer: >90 mL/min (ref >90)
Potassium: 3.5 mEq/L (ref 3.5–5.1)
Sodium: 140 mEq/L (ref 135–145)

## 2012-12-20 MED ORDER — SIMVASTATIN 20 MG PO TABS
20.0000 mg | ORAL_TABLET | Freq: Every day | ORAL | Status: DC
  Administered 2012-12-20 – 2012-12-21 (×2): 20 mg via ORAL
  Filled 2012-12-20 (×2): qty 1

## 2012-12-20 MED ORDER — ALBUTEROL SULFATE HFA 108 (90 BASE) MCG/ACT IN AERS
2.0000 | INHALATION_SPRAY | RESPIRATORY_TRACT | Status: DC | PRN
  Filled 2012-12-20: qty 6.7

## 2012-12-20 MED ORDER — HEPARIN SODIUM (PORCINE) 5000 UNIT/ML IJ SOLN
5000.0000 [IU] | Freq: Three times a day (TID) | INTRAMUSCULAR | Status: DC
  Administered 2012-12-20 – 2012-12-21 (×4): 5000 [IU] via SUBCUTANEOUS
  Filled 2012-12-20 (×7): qty 1

## 2012-12-20 MED ORDER — SODIUM CHLORIDE 0.9 % IJ SOLN
3.0000 mL | Freq: Two times a day (BID) | INTRAMUSCULAR | Status: DC
  Administered 2012-12-20 – 2012-12-21 (×3): 3 mL via INTRAVENOUS

## 2012-12-20 MED ORDER — PANTOPRAZOLE SODIUM 40 MG PO TBEC
40.0000 mg | DELAYED_RELEASE_TABLET | Freq: Every day | ORAL | Status: DC
  Administered 2012-12-20 – 2012-12-21 (×2): 40 mg via ORAL
  Filled 2012-12-20 (×2): qty 1

## 2012-12-20 MED ORDER — ASPIRIN EC 81 MG PO TBEC
81.0000 mg | DELAYED_RELEASE_TABLET | Freq: Every day | ORAL | Status: DC
  Administered 2012-12-20 – 2012-12-21 (×2): 81 mg via ORAL
  Filled 2012-12-20 (×2): qty 1

## 2012-12-20 MED ORDER — ADULT MULTIVITAMIN W/MINERALS CH
1.0000 | ORAL_TABLET | Freq: Every day | ORAL | Status: DC
  Administered 2012-12-20 – 2012-12-21 (×2): 1 via ORAL
  Filled 2012-12-20 (×2): qty 1

## 2012-12-20 MED ORDER — FLUTICASONE PROPIONATE 50 MCG/ACT NA SUSP: 2.0000 | Freq: Every day | NASAL | Status: DC | PRN

## 2012-12-20 MED ORDER — BUDESONIDE-FORMOTEROL FUMARATE 160-4.5 MCG/ACT IN AERO
2.0000 | INHALATION_SPRAY | Freq: Two times a day (BID) | RESPIRATORY_TRACT | Status: DC
  Administered 2012-12-20 – 2012-12-21 (×3): 2 via RESPIRATORY_TRACT
  Filled 2012-12-20: qty 6

## 2012-12-20 MED ORDER — HYDROCHLOROTHIAZIDE 25 MG PO TABS
25.0000 mg | ORAL_TABLET | Freq: Every day | ORAL | Status: DC
  Administered 2012-12-20 – 2012-12-21 (×2): 25 mg via ORAL
  Filled 2012-12-20 (×2): qty 1

## 2012-12-20 NOTE — Care Management Utilization Note (Addendum)
CARE MANAGE MENT UTILIZATION REVIEW NOTE 12/20/2012     Patient:  [name], [name]   Account Number:  [id number]  Documented by:  Roxy Manns Johathan Province   Per Ur Regulation Reviewed for med. necessity/level of care/duration of stay

## 2012-12-20 NOTE — H&P (Signed)
[facility] Attending Admit Note Patient seen and examined by me on date of admission (Dec 19, 2012 @1030pm ), I discussed case with admitting resident and I agree with Dr Janeece Riggers assessment and plan as documented in this note. 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  Has been associated with diaphoresis and nausea, denies associated dyspnea.  She reports at time of my exam that she is pain-free after receiving NTG in the ED. She has a significant tobacco history and is an active smoker, but she denies any known CAD in her relatives (parents or siblings). Denies any recent leg pain or swelling.  On exam she is comfortable-appearing and alert, awake, cooperative.  No point tenderness on sternum or RSB where she indicates the pain has been.  Regular S1S2 PULM Clear bilaterally. Abd soft LEs; No disparate calf girth; no erythema or cords.  No tenderness in popliteal area.   ECG with NSR, notable Q waves in inferior leads which are present at least as far back as 03/2012 (difficult to read earlier ECG in chart due to poor quality scan).  Agree with plan for admission and cycling TroponinI, monitoring.  I favor Cardiology consult in setting of intermittent pain coming on at rest and resolving with NTG; abnormal ECG which favors prior event.  Smoking cessation counseling. I have a low index of suspicion for PE given her exam and history of waxing and waning without dyspnea or hypoxia or tachycardia. Paula Compton, MD

## 2012-12-20 NOTE — Progress Notes (Signed)
Seen, examined and discussed in rounds.  Agree with Dr. Terald Sleeper documentation and management.

## 2012-12-20 NOTE — H&P (Signed)
Family Medicine Teaching [facility] H&P Service Pager: [phone]  Patient name: [name] Medical record number: [id number] Date of birth: [birth year] Age: 52 y.o. Gender: female  Primary Care Provider: Lloyd Huger, MD Consultants: None  CC  Chest Pain  HPI  [name] is a 52 y.o. year old female presenting with 3 day history of chest pain and dyspnea.  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  She reports that it would first occur while working on a sewing machine.  Pain became persistent as of yesterday morning then progressively intensified prompting her evaluation in the emergency department.  She reports that this pain is somewhat similar to prior episodes she had 2 years ago when she was diagnosed with GERD.  However it does seem slightly different especially regarding her respiratory status.  She reports that it does radiate to her left arm into her jaw into her back.  She denies any diaphoresis or vomiting.  The chest pressure is midsternal.  She does have some shortness of breath especially with exertion it seems that it has worsened over the past 3 days with his discomfort but she does have significant itching on exertion due to her sarcoid at baseline.  Nitroglycerin and seemed to help her chest tightness however cause her a headache as well as worsening of her arm pain.  Also increased some stomach discomfort that is mid umbilical in character.  This abdominal pain was exacerbated with a GI cocktail  ROS   Constitutional  had been feeling well up until 3 days ago.  No dizziness no lightheadedness   Infectious  no fevers no chills   Resp  some chronic cough, chronic dyspnea.  Worsened as above   Cardiac  no palpitations, chest pain in pressure as above   GI  some nausea, some of abdominal pain as above.  No vomiting.  No melena, no hematochezia   GU  no dysuria, frequency      HISTORY  PMHx:  Past Medical History  Diagnosis Date  .  Hypertension   . Sarcoidosis STABLE PER CXR JUNE 2013  . Long-term current use of steroids SYMBICORT INHALER  . Allergic rhinitis   . HTN (hypertension)   . Iron deficiency anemia   . COPD, mild FOLLOWED BY DR [name]  . Sickle cell trait   . 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     PSHx: Past Surgical History  Procedure Date  . Cesarean section 1986    W/ BILATERAL TUBAL LIGATION  . Total robotic assisted laparoscopic hysterectomy 12-30-2010    SYMPTOMATIC UTERINE FIBROIDS  . Upper teeth extraction's 1992  . Median rectus repair 05/22/2012    Procedure: MEDIAN RECTUS REPAIR;  Surgeon: Corinda Gubler, MD;  Location: [facility];  Service: Ophthalmology;  Laterality: Bilateral;  INFERIOR RECTUS RESECTION WITH ADJUSTIBLE SUTURES RIGHT EYE   . Adjustable suture manipulation 05/22/2012    Procedure: ADJUSTABLE SUTURE MANIPULATION;  Surgeon: Corinda Gubler, MD;  Location: [facility];  Service: Ophthalmology;  Laterality: Right;    Social Hx: History   Social History  . Marital Status: Single    Spouse Name: N/A    Number of Children: N/A  . Years of Education: N/A   Social History Main Topics  . Smoking status: Former Smoker -- 1.0 packs/day for 20 years    Quit date: 11/14/2003  . Smokeless tobacco: Never Used  . Alcohol Use: No  . Drug Use: No  . Sexually Active: None  Other Topics Concern  . None   Social History Narrative   Lives with mother and his 3 children.  Works as a Psychologist, educational for sewing Administrator, arts)    Family Hx: Family History  Problem Relation Age of Onset  . Hyperlipidemia Mother   . Asthma Mother   . Lupus Mother   . Hypertension Father   . Hyperlipidemia Father   . Hypertension Sister   . Hypertension Brother   . Cancer Neg Hx   . Diabetes Neg Hx   . Coronary artery disease Neg Hx     Allergies: Allergies  Allergen Reactions  . Codeine     Avoids-felt bad when took before    Home Medications: Prescriptions prior to  admission  Medication Sig Dispense Refill  . albuterol (PROVENTIL HFA;VENTOLIN HFA) 108 (90 BASE) MCG/ACT inhaler Inhale 2 puffs into the lungs every 4 (four) hours as needed. For shortness of breath or wheezing      . budesonide-formoterol (SYMBICORT) 160-4.5 MCG/ACT inhaler Inhale 2 puffs into the lungs 2 (two) times daily. 2 puffs each am and 2 each pm  3 Inhaler  3  . fluticasone (FLONASE) 50 MCG/ACT nasal spray Place 2 sprays into the nose daily as needed. For stuffiness      . hydrochlorothiazide (HYDRODIURIL) 25 MG tablet Take 25 mg by mouth daily.      . Multiple Vitamin (MULTIVITAMIN WITH MINERALS) TABS Take 1 tablet by mouth daily.      Marland Kitchen omeprazole (PRILOSEC) 20 MG capsule Take 30- 60 min before your first meal of the day        OBJECTIVE  Vitals: Temp:  [98.7 F (37.1 C)] 98.7 F (37.1 C) (02/06 1733) Pulse Rate:  [79-102] 79  (02/06 2352) Resp:  [14-18] 14  (02/06 2352) BP: (100-155)/(70-80) 100/70 mmHg (02/06 2352) SpO2:  [96 %-100 %] 96 % (02/06 2352)  Weight: Wt Readings from Last 3 Encounters:  11/28/12 170 lb 9.6 oz (77.384 kg)  10/22/12 170 lb 14.4 oz (77.52 kg)  10/08/12 172 lb (78.019 kg)    I&Os: Yesterday:   This shift:     PE: GENERAL:  Adult African American female. In no discomfort; no respiratory distress. PSYCH: Alert and appropriately interactive; Insight:Good   H&N: AT/[place], trachea midline EENT:  MMM, no scleral icterus, EOMi HEART: RRR, S1/S2 heard, no murmur LUNGS: CTA B, no wheezes, no crackles CHEST: no reproducible pain with sternal palpation Abdomen: no epigastric pain, +BS soft , non tender EXTREMITIES: Moves all 4 extremities spontaneously, warm well perfused, no edema, bilateral DP and PT pulses 2/4.     LABS: CBC BMET   Lab 12/19/12 1750  WBC 9.2  HGB 13.4  HCT 39.8  PLT 303    Lab 12/19/12 1750  NA 140  K 3.4*  CL 99  CO2 33*  BUN 10  CREATININE 0.70  GLUCOSE 94  CALCIUM 9.5     URINE  STUDIES: None  MICRO: None  IMAGING: Dg Chest 2 View  12/19/2012  *RADIOLOGY REPORT*  Clinical Data: Chest pain  CHEST - 2 VIEW  Comparison: 04/11/2012  Findings: Stable chronic interstitial markings with bilateral lower lobe scarring.  No focal consolidation. No pleural effusion or pneumothorax.  Heart is normal in size.  Mild degenerative changes of the visualized thoracolumbar spine.  IMPRESSION: No evidence of acute cardiopulmonary disease.  Stable chronic interstitial markings/scarring.   Original Report Authenticated By: Charline Bills, M.D.      12-19-2012: EKG:   Medications:      .  aspirin EC  81 mg Oral Daily  . budesonide-formoterol  2 puff Inhalation BID  . heparin  5,000 Units Subcutaneous Q8H  . hydrochlorothiazide  25 mg Oral Daily  . multivitamin with minerals  1 tablet Oral Daily  . pantoprazole  40 mg Oral Daily  . sodium chloride  3 mL Intravenous Q12H    Assessment & Plan  LOS: 30 52 y.o. year old female with a three-day onset of chest pain.  Negative cardiac troponin and no signs of active ischemia on EKG in the emergency department.  FMTS asked to admit for ACS evaluation.  # Chest pain: Patient is currently chest pain-free however has recurrence of pain with minimal exertion.  NitroglycerinX1 did seem to help alleviate her pain.  Currently she is chest pain-free however with minimal exertion can be brought on.  Undergoing ACS evaluation.    Risk stratification labs  Cardiac enzymes cycling.    EKG in the morning.  If patient continues to have chest pain will need to consider a referral to cardiology for inpatient stress test especially the setting of her start with disease  Will obtain a 2-D echo given her history of sarcoidosis to evaluate for any type of restrictive cardiac disease low likelihood given stable blood pressures  ASA  Less likely GERD component given not improved with GI cocktail however patient does report not having eaten anything throughout  the day and this seems to have made it worse  # Hypertension: We'll continue her home hydrochlorothiazide   # Sarcoid: Followed closely by pulmonology.  We will continue her home medications at this time  No oxygen requirements currently  continue budesonide/formoterol  # GERD :  continue PPI by mouth daily   --- FEN  *SL -Cardiac Diet --- PPx: Heparin, PPI    Disposition   patiently placed on the cardiac evaluation floor.  Psych of cardiac enzymes.  Risk stratify.  If still I. exertional chest pain in the morning will need to consider cardiology consult for further stress testing while in hospital.  Will obtain 2-D echo to evaluate for restrictive disease given history of sarcoidosis   Andrena Mews, DO [facility] Resident - PGY-2 12/20/2012 12:38 AM

## 2012-12-20 NOTE — Progress Notes (Signed)
  Echocardiogram 2D Echocardiogram has been performed.  Cathie Beams 12/20/2012, 12:08 PM

## 2012-12-20 NOTE — Consult Note (Signed)
Reason for Consult: Chest pain Referring Physician: Family medicine teaching service  [name] is an 52 y.o. female.  HPI: [name] is 52 year old female with past medical history significant for hypertension, sarcoidosis, GERD, chronic anemia, history of tobacco abuse 30 pack years quit in 2005 was admitted because of recurrent chest pain since last 3 days off and on last night chest pain woke her up around 4 AM took Prilosec without much relief chest pain described as pressure grade 4-5/10 radiating to left shoulder and associated with tingling in left arm so decided to come to the ER for further evaluation. Patient denies any exertional chest pain but does complain of exertional dyspnea associated with feeling tired and weak. Denies any nausea vomiting diaphoresis denies any palpitation lightheadedness or syncopal episode. Denies any recent cardiac workup. Denies any relation of chest pain to food breathing or movement.  Past Medical History  Diagnosis Date  . Hypertension   . Sarcoidosis STABLE PER CXR JUNE 2013  . Long-term current use of steroids SYMBICORT INHALER  . Allergic rhinitis   . HTN (hypertension)   . Iron deficiency anemia   . COPD, mild FOLLOWED BY DR [name]  . Sickle cell trait   . 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 Shortness of breath     Past Surgical History  Procedure Date  . Cesarean section 1986    W/ BILATERAL TUBAL LIGATION  . Total robotic assisted laparoscopic hysterectomy 12-30-2010    SYMPTOMATIC UTERINE FIBROIDS  . Upper teeth extraction's 1992  . Median rectus repair 05/22/2012    Procedure: MEDIAN RECTUS REPAIR;  Surgeon: Corinda Gubler, MD;  Location: [facility];  Service: Ophthalmology;  Laterality: Bilateral;  INFERIOR RECTUS RESECTION WITH ADJUSTIBLE SUTURES RIGHT EYE   . Adjustable suture manipulation 05/22/2012    Procedure: ADJUSTABLE SUTURE MANIPULATION;  Surgeon: Corinda Gubler, MD;  Location: [facility];  Service:  Ophthalmology;  Laterality: Right;  . Eye surgery     Family History  Problem Relation Age of Onset  . Hyperlipidemia Mother   . Asthma Mother   . Lupus Mother   . Hypertension Father   . Hyperlipidemia Father   . Hypertension Sister   . Hypertension Brother   . Cancer Neg Hx   . Diabetes Neg Hx   . Coronary artery disease Neg Hx     Social History:  reports that she quit smoking about 9 years ago. She has never used smokeless tobacco. She reports that she does not drink alcohol or use illicit drugs.  Allergies:  Allergies  Allergen Reactions  . Codeine     Avoids-felt bad when took before    Medications: I have reviewed the patient's current medications.  Results for orders placed during the hospital encounter of 12/19/12 (from the past 48 hour(s))  CBC     Status: Normal   Collection Time   12/19/12  5:50 PM      Component Value Range Comment   WBC 9.2  4.0 - 10.5 K/uL    RBC 4.98  3.87 - 5.11 MIL/uL    Hemoglobin 13.4  12.0 - 15.0 g/dL    HCT 96.0  45.4 - 09.8 %    MCV 79.9  78.0 - 100.0 fL    MCH 26.9  26.0 - 34.0 pg    MCHC 33.7  30.0 - 36.0 g/dL    RDW 11.9  14.7 - 82.9 %    Platelets 303  150 - 400  K/uL   BASIC METABOLIC PANEL     Status: Abnormal   Collection Time   12/19/12  5:50 PM      Component Value Range Comment   Sodium 140  135 - 145 mEq/L    Potassium 3.4 (*) 3.5 - 5.1 mEq/L    Chloride 99  96 - 112 mEq/L    CO2 33 (*) 19 - 32 mEq/L    Glucose, Bld 94  70 - 99 mg/dL    BUN 10  6 - 23 mg/dL    Creatinine, Ser 1.61  0.50 - 1.10 mg/dL    Calcium 9.5  8.4 - 09.6 mg/dL    GFR calc non Af Amer >90  >90 mL/min    GFR calc Af Amer >90  >90 mL/min   POCT I-STAT TROPONIN I     Status: Normal   Collection Time   12/19/12  6:36 PM      Component Value Range Comment   Troponin i, poc 0.00  0.00 - 0.08 ng/mL    Comment 3            TROPONIN I     Status: Normal   Collection Time   12/19/12  9:35 PM      Component Value Range Comment   Troponin I <0.30   <0.30 ng/mL   TSH     Status: Normal   Collection Time   12/20/12 12:45 AM      Component Value Range Comment   TSH 1.293  0.350 - 4.500 uIU/mL   TROPONIN I     Status: Normal   Collection Time   12/20/12 12:45 AM      Component Value Range Comment   Troponin I <0.30  <0.30 ng/mL   BASIC METABOLIC PANEL     Status: Normal   Collection Time   12/20/12  6:30 AM      Component Value Range Comment   Sodium 140  135 - 145 mEq/L    Potassium 3.5  3.5 - 5.1 mEq/L    Chloride 103  96 - 112 mEq/L    CO2 30  19 - 32 mEq/L    Glucose, Bld 94  70 - 99 mg/dL    BUN 14  6 - 23 mg/dL    Creatinine, Ser 0.45  0.50 - 1.10 mg/dL    Calcium 9.2  8.4 - 40.9 mg/dL    GFR calc non Af Amer >90  >90 mL/min    GFR calc Af Amer >90  >90 mL/min   TROPONIN I     Status: Normal   Collection Time   12/20/12  6:30 AM      Component Value Range Comment   Troponin I <0.30  <0.30 ng/mL   LIPID PANEL     Status: Abnormal   Collection Time   12/20/12  6:30 AM      Component Value Range Comment   Cholesterol 187  0 - 200 mg/dL    Triglycerides 811  <914 mg/dL    HDL 53  >78 mg/dL    Total CHOL/HDL Ratio 3.5      VLDL 28  0 - 40 mg/dL    LDL Cholesterol 295 (*) 0 - 99 mg/dL   TROPONIN I     Status: Normal   Collection Time   12/20/12 12:54 PM      Component Value Range Comment   Troponin I <0.30  <0.30 ng/mL     Dg Chest 2 View  12/19/2012  *RADIOLOGY REPORT*  Clinical Data: Chest pain  CHEST - 2 VIEW  Comparison: 04/11/2012  Findings: Stable chronic interstitial markings with bilateral lower lobe scarring.  No focal consolidation. No pleural effusion or pneumothorax.  Heart is normal in size.  Mild degenerative changes of the visualized thoracolumbar spine.  IMPRESSION: No evidence of acute cardiopulmonary disease.  Stable chronic interstitial markings/scarring.   Original Report Authenticated By: Charline Bills, M.D.     Review of Systems  Constitutional: Negative for fever and chills.  Eyes: Negative for  blurred vision and double vision.  Respiratory: Negative for cough, hemoptysis and sputum production.   Cardiovascular: Positive for chest pain. Negative for palpitations, orthopnea, claudication and leg swelling.  Gastrointestinal: Negative for nausea, vomiting and abdominal pain.  Genitourinary: Negative for dysuria.  Neurological: Negative for dizziness and headaches.   Blood pressure 120/71, pulse 92, temperature 98.5 F (36.9 C), temperature source Oral, resp. rate 18, height 5\' 5"  (1.651 m), weight 78.336 kg (172 lb 11.2 oz), SpO2 95.00%. Physical Exam  Constitutional: She is oriented to person, place, and time. She appears well-developed and well-nourished.  HENT:  Head: Normocephalic and atraumatic.  Eyes: Conjunctivae normal are normal. Pupils are equal, round, and reactive to light. Left eye exhibits no discharge. No scleral icterus.  Neck: Normal range of motion. Neck supple. No JVD present. No tracheal deviation present. No thyromegaly present.  Cardiovascular: Normal rate, regular rhythm, normal heart sounds and intact distal pulses.  Exam reveals no gallop and no friction rub.   No murmur heard. Respiratory: Effort normal and breath sounds normal. No respiratory distress. She has no wheezes. She has no rales.  GI: Soft. Bowel sounds are normal. She exhibits no distension. There is no tenderness. There is no rebound and no guarding.  Musculoskeletal: She exhibits no edema and no tenderness.  Lymphadenopathy:    She has no cervical adenopathy.  Neurological: She is alert and oriented to person, place, and time.    Assessment/Plan: Recurrent chest pain rule out coronary insufficiency MI ruled out  Hypertension Sarcoidosis GERD Anemia Remote tobacco abuse Plan Continue present management Consider adding low-dose beta blocker  will schedule for nuclear stress test in a.m.  Kouper Spinella N 12/20/2012, 9:00 PM

## 2012-12-20 NOTE — Care Management Note (Addendum)
  Page 1 of 1   12/20/2012     3:06:07 PM   CARE MANAGEMENT NOTE 12/20/2012  Patient:  [name], [name]   Account Number:  [id number]  Date Initiated:  12/20/2012  Documentation initiated by:  DAVIS,TYMEEKA  Subjective/Objective Assessment:   52 yo female admitted with chestpain. PTA pt independent, self employed.     Action/Plan:   Home when stable   Anticipated DC Date:  12/21/2012   Anticipated DC Plan:  HOME/SELF CARE      DC Planning Services  CM consult  Medication Assistance  MATCH Program      Choice offered to / List presented to:             Status of service:  Completed, signed off Medicare Important Message given?   (If response is "NO", the following Medicare IM given date fields will be blank) Date Medicare IM given:   Date Additional Medicare IM given:    Discharge Disposition:  HOME/SELF CARE  Per UR Regulation:  Reviewed for med. necessity/level of care/duration of stay  If discussed at Long Length of Stay Meetings, dates discussed:    Comments:  12-20-12 [address] [place], [place] [phone] CM did speak to pt and CM is agreeable for the [facility] program. Match Letter will be in the Shadow Chart. MD will need to write Rx's and pt is to go to a pharmacy listed on Match Letter. RN please give the pt the Match Letter on day fo d/c along with Rx's. Thanks. CM did call MD to see if they can set her up with person in office that assists with the [facility] card. Pt is aware that she can only use the Match Program 1 time a year. No further needs form CM at this time.   12/20/12 1108 Tymeeka Davis,RN,BSN [phone]

## 2012-12-20 NOTE — Progress Notes (Signed)
Family Medicine Teaching Service Daily Progress Note Service Page: [phone]  Subjective:  Feels well, notes 1 episode of chest pain this am that was like her pain last noight. No dyspnea, no other complaints. Also having some L hand pain and neck discomfort.   Objective: Temp:  [97.8 F (36.6 C)-98.7 F (37.1 C)] 98 F (36.7 C) (02/07 0626) Pulse Rate:  [79-102] 88  (02/07 0626) Resp:  [14-18] 18  (02/07 0626) BP: (100-155)/(70-80) 118/71 mmHg (02/07 0959) SpO2:  [93 %-100 %] 93 % (02/07 0626) Weight:  [166 lb 3.2 oz (75.388 kg)] 166 lb 3.2 oz (75.388 kg) (02/07 0015)  Exam: Gen: NAD, alert, cooperative with exam HEENT: NCAT, EOMI, PERRL CV: RRR, good S1/S2, no murmur Resp: CTABL, no wheezes, non-labored Abd: SNTND, BS present, no guarding or organomegaly Ext: No edema, warm Neuro: Alert and oriented, No gross deficits   I have reviewed the patient's medications, labs, imaging, and diagnostic testing.  Notable results are summarized below.  CBC BMET   Lab 12/19/12 1750  WBC 9.2  HGB 13.4  HCT 39.8  PLT 303    Lab 12/20/12 0630 12/19/12 1750  NA 140 140  K 3.5 3.4*  CL 103 99  CO2 30 33*  BUN 14 10  CREATININE 0.73 0.70  GLUCOSE 94 94  CALCIUM 9.2 9.5       12/20/2012 06:30  Cholesterol 187  Triglycerides 139  HDL 53  LDL (calc) 106 (H)  VLDL 28  Total CHOL/HDL Ratio 3.5  TSH: 1.29     Lab 12/20/12 0630 12/20/12 0045 12/19/12 2135  TROPONINI <0.30 <0.30 <0.30     Imaging/Diagnostic Tests: CXR 12/19/2012 IMPRESSION:  No evidence of acute cardiopulmonary disease.  Stable chronic interstitial markings/scarring.  Plan: 52 y.o. year old female with PMH of HTN and sarcoidosis with a three-day onset of chest pain. Negative cardiac troponin and no signs of active ischemia on EKG in the emergency department  Chest pain - atypical - Exertional and relieved by nitro, short episode this am similar to priors - Troponins negative X 3 - Consulted cardiology-  appreciate reccs, likely stress test tomorrow.  - Repeat EKG without ischemic changes.  - With sarcoid we will get a TTE to rule out restrictive cardiac disease, although unlikely with stable BP - LDL elevated - start statin- needs inexpensive so will use pravastatin op,  - Daily aspirin - follow for cards reccs  HTN - well controlled - continue home HCTZ  Sarcoid - followed by pulmonology OP, continue symbicort  GERD - This pain unlike previous GERD - Continue PPI  Access to meds- states she has a hard time affording meds- consult care mngmt for help.   FEN/GI: Heart healthy, SLIV PPx: heparin Dispo: Home after cardiac work up Code Status: Elwyn Reach, MD 12/20/2012, 12:15 PM

## 2012-12-21 ENCOUNTER — Observation Stay (HOSPITAL_COMMUNITY): Payer: Self-pay

## 2012-12-21 ENCOUNTER — Encounter (HOSPITAL_COMMUNITY): Payer: Self-pay | Admitting: *Deleted

## 2012-12-21 DIAGNOSIS — R911 Solitary pulmonary nodule: Secondary | ICD-10-CM

## 2012-12-21 DIAGNOSIS — K219 Gastro-esophageal reflux disease without esophagitis: Secondary | ICD-10-CM | POA: Diagnosis present

## 2012-12-21 DIAGNOSIS — E785 Hyperlipidemia, unspecified: Secondary | ICD-10-CM | POA: Diagnosis present

## 2012-12-21 MED ORDER — TECHNETIUM TC 99M SESTAMIBI GENERIC - CARDIOLITE
10.0000 | Freq: Once | INTRAVENOUS | Status: AC | PRN
  Administered 2012-12-21: 10 via INTRAVENOUS

## 2012-12-21 MED ORDER — SIMVASTATIN 20 MG PO TABS: 20.0000 mg | ORAL_TABLET | Freq: Every day | ORAL | Status: DC

## 2012-12-21 MED ORDER — METOPROLOL TARTRATE 25 MG PO TABS
25.0000 mg | ORAL_TABLET | Freq: Two times a day (BID) | ORAL | Status: DC
  Administered 2012-12-21: 25 mg via ORAL
  Filled 2012-12-21 (×2): qty 1

## 2012-12-21 MED ORDER — REGADENOSON 0.4 MG/5ML IV SOLN
0.4000 mg | Freq: Once | INTRAVENOUS | Status: AC
  Administered 2012-12-21: 0.4 mg via INTRAVENOUS

## 2012-12-21 MED ORDER — HYDROCHLOROTHIAZIDE 12.5 MG PO TABS: 12.5000 mg | ORAL_TABLET | Freq: Every day | ORAL | Status: DC

## 2012-12-21 MED ORDER — ASPIRIN 81 MG PO TBEC: 81.0000 mg | DELAYED_RELEASE_TABLET | Freq: Every day | ORAL | Status: DC

## 2012-12-21 MED ORDER — FLUTICASONE PROPIONATE 50 MCG/ACT NA SUSP: 2.0000 | Freq: Every day | NASAL | Status: DC | PRN

## 2012-12-21 MED ORDER — TECHNETIUM TC 99M SESTAMIBI GENERIC - CARDIOLITE
30.0000 | Freq: Once | INTRAVENOUS | Status: AC | PRN
  Administered 2012-12-21: 30 via INTRAVENOUS

## 2012-12-21 MED ORDER — OMEPRAZOLE 20 MG PO CPDR: 20.0000 mg | DELAYED_RELEASE_CAPSULE | Freq: Every day | ORAL | Status: DC

## 2012-12-21 MED ORDER — PRAVASTATIN SODIUM 40 MG PO TABS: 40.0000 mg | ORAL_TABLET | Freq: Every day | ORAL | Status: DC

## 2012-12-21 MED ORDER — ALBUTEROL SULFATE HFA 108 (90 BASE) MCG/ACT IN AERS: 2.0000 | INHALATION_SPRAY | RESPIRATORY_TRACT | Status: DC | PRN

## 2012-12-21 MED ORDER — METOPROLOL TARTRATE 25 MG PO TABS: 25.0000 mg | ORAL_TABLET | Freq: Two times a day (BID) | ORAL | Status: DC

## 2012-12-21 MED ORDER — BUDESONIDE-FORMOTEROL FUMARATE 160-4.5 MCG/ACT IN AERO: 2.0000 | INHALATION_SPRAY | Freq: Two times a day (BID) | RESPIRATORY_TRACT | Status: DC

## 2012-12-21 NOTE — Discharge Summary (Signed)
Physician Discharge Summary  Patient ID: [name] MRN: [id number] DOB: 06/10/1961 Age: 52 y.o.  Admit date: 12/19/2012 Discharge date: 12/21/2012  PCP: Lloyd Huger, MD  Consultants:Dr. Sharyn Lull of Cardiology    Discharge Diagnosis: Principal Problem:   Chest pain, unknown etiology Active Problems:   Sarcoidosis   HYPERTENSION, BENIGN SYSTEMIC   COPD   GERD (gastroesophageal reflux disease)   Hyperlipidemia    Hospital Course 52 y.o. year old female with PMH of HTN and sarcoidosis with a three-day onset of chest pain. Negative cardiac troponin and no signs of active ischemia on EKG in the emergency department   1. Chest pain - see H+P for full details. Patient had substernal chest pain, relieved by nitro, and initially related to exertion. Was evaluated by cardiology due to constellation of symptoms.  Patient chest pain free for 24 hours before discharge. No swelling in calves or tenderness to suggest DVT as source of PE causing pain-additionally, no hypoxia, tachypnea, or tachycardia.   - Troponins negative X 3, EKGs without ischemic changes.   - lexiscan myoview EF 71% and no reversible ischemia or infarction. Normal wall motion. Normal study.   -advised beta blocker which patient was started on at discharge.   -Echo with mild LVH and EF 60%. No restrictive disease noted (concern was due to sarcoid)   - Risk stratification   -LDL elevated - pravastatin started and to be continued outpatient.    -TSH wnl   -a1c not ordered-could consider outpatient.   - Daily aspirin   -at time of discharge, previous ct in March 2013 with 8mm pulmonary nodule noted  and recommendations were q6-12 month follow up.  2. HTN-well controlled on home HCTZ.  3. Sarcoidosis-followed by pulmonology OP, continued symbicort and albuterol prn. Did have intermittent SOB which these were helpful for.  4. GERD-continued PPI. Pain felt to be different from typical GERD pain.  5. Access to meds- states she has  a hard time affording meds- consulted care mngmt for help. Can get meds for $3 and patient given info by care management.    Procedures/Imaging:  Dg Chest 2 View  12/19/2012  *RADIOLOGY REPORT*  Clinical Data: Chest pain  CHEST - 2 VIEW  Comparison: 04/11/2012  Findings: Stable chronic interstitial markings with bilateral lower lobe scarring.  No focal consolidation. No pleural effusion or pneumothorax.  Heart is normal in size.  Mild degenerative changes of the visualized thoracolumbar spine.  IMPRESSION: No evidence of acute cardiopulmonary disease.  Stable chronic interstitial markings/scarring.   Original Report Authenticated By: Charline Bills, M.D.    Nm Myocar Multi W/spect W/wall Motion / Ef  12/21/2012  *RADIOLOGY REPORT*  Clinical Data:  39 real female with chest pain, hypertension and COPD  MYOCARDIAL IMAGING WITH SPECT (REST AND PHARMACOLOGIC-STRESS) GATED LEFT VENTRICULAR WALL MOTION STUDY LEFT VENTRICULAR EJECTION FRACTION  Technique:  Resting myocardial SPECT imaging was initially performed after intravenous administration of radiopharmaceutical. Myocardial SPECT was subsequently performed after additional radiopharmaceutical injection during pharmacologic-stress supervised by the Cardiology staff.  Quantitative gated imaging was also performed to evaluate left ventricular wall motion, and estimate left ventricular ejection fraction.  Radiopharmaceutical:  Tc-78m cardiolite at rest and during stress.  Comparison: none  Findings:  Technique: Study is adequate. There is gut activity adjacent to the left ventricle the  Perfusion:  There are no relative decreased counts on stress or rest to suggest reversible ischemia or infarction. There are decreased counts within the inferolateral wall on rest and stress  which are felt relate to gut activity.  Wall motion:  No focal wall motion abnormality.  Normal contractility.  Left ventricular ejection fraction:  Calculated left ventricular  ejection fraction =  71%  IMPRESSION:  1.  No reversible ischemia or infarction. 2.  Normal wall motion. 3.  Left ventricular ejection fraction equal 71%   Original Report Authenticated By: Genevive Bi, M.D.     Labs  CBC  Recent Labs Lab 12/19/12 1750  WBC 9.2  HGB 13.4  HCT 39.8  PLT 303   BMET  Recent Labs Lab 12/19/12 1750 12/20/12 0630  NA 140 140  K 3.4* 3.5  CL 99 103  CO2 33* 30  BUN 10 14  CREATININE 0.70 0.73  CALCIUM 9.5 9.2  GLUCOSE 94 94   Patient condition at time of discharge/disposition: stable  Disposition-home   Follow up issues: 1. Make sure patient compliant with new medicines-statin, aspirin, metoprolol 2. Follow up chest pain.  3. at time of discharge, previous ct in March 2013 with 8mm pulmonary nodule noted  and recommendations were q6-12 month follow up.  Discharge follow up:  Follow-up Information   Follow up with Lloyd Huger, MD. Schedule an appointment as soon as possible for a visit in 1 week. (for hospital follow up. Can also see Dr. Ermalinda Memos, Dr. Birdie Sons, or Dr. Pollie Meyer)    Contact information:   [address] [place] [address] [phone]      Discharge Orders   Future Appointments Provider Department Dept Phone   12/27/2012 3:30 PM Durwin Reges, MD [facility] [phone]   Future Orders Complete By Expires     Call MD for:  difficulty breathing, headache or visual disturbances  As directed     Call MD for:  persistant dizziness or light-headedness  As directed     Call MD for:  persistant nausea and vomiting  As directed     Call MD for:  severe uncontrolled pain  As directed     Diet - low sodium heart healthy  As directed     Increase activity slowly  As directed        Discharge Medications   Medication List    STOP taking these medications       ibuprofen 200 MG tablet  Commonly known as:  ADVIL,MOTRIN     OVER THE COUNTER MEDICATION      TAKE these medications        albuterol 108 (90 BASE) MCG/ACT inhaler  Commonly known as:  PROVENTIL HFA;VENTOLIN HFA  Inhale 2 puffs into the lungs every 4 (four) hours as needed. For shortness of breath or wheezing     aspirin 81 MG EC tablet  Take 1 tablet (81 mg total) by mouth daily.     budesonide-formoterol 160-4.5 MCG/ACT inhaler  Commonly known as:  SYMBICORT  Inhale 2 puffs into the lungs 2 (two) times daily. 2 puffs each am and 2 each pm     fluticasone 50 MCG/ACT nasal spray  Commonly known as:  FLONASE  Place 2 sprays into the nose daily as needed. For stuffiness     hydrochlorothiazide 25 MG tablet  Commonly known as:  HYDRODIURIL  Take 25 mg by mouth daily.     metoprolol tartrate 25 MG tablet  Commonly known as:  LOPRESSOR  Take 1 tablet (25 mg total) by mouth 2 (two) times daily.     multivitamin with minerals Tabs  Take 1 tablet by mouth  daily.     omeprazole 20 MG capsule  Commonly known as:  PRILOSEC  Take 30- 60 min before your first meal of the day     pravastatin 40 MG tablet  Commonly known as:  PRAVACHOL  Take 1 tablet (40 mg total) by mouth daily.       Tana Conch, MD, PGY2 of [facility] 12/21/2012 2:01 PM

## 2012-12-21 NOTE — Progress Notes (Signed)
All data recorded on 908 invalid documented on wrong pt.

## 2012-12-21 NOTE — Discharge Summary (Signed)
Seen and examined.  Agree with DC as outlined by Dr. Durene Cal.

## 2012-12-21 NOTE — Progress Notes (Signed)
Seen and examined.  Discussed with Dr. Durene Cal.  Agree with his documentation and management. Denies chest pain.  Myoview neg.  Will check with cards, but OK to DC from what I can tell.

## 2012-12-21 NOTE — Progress Notes (Signed)
Subjective:  Seen a nuclear medicine Department. Denies any chest pain or shortness of breath. Exercised on Bruce protocol for approximately 2 minutes became short of breath and could not continue EKG did not show any ischemic changes. Switched to [employer]. Tolerated well no acute ischemic changes noted. Myoview result still pending Objective:  Vital Signs in the last 24 hours: Temp:  [98.1 F (36.7 C)-98.5 F (36.9 C)] 98.1 F (36.7 C) (02/08 0500) Pulse Rate:  [77-122] 112 (02/08 1059) Resp:  [18] 18 (02/08 0500) BP: (115-150)/(71-96) 133/75 mmHg (02/08 1059) SpO2:  [95 %-96 %] 96 % (02/08 0724) Weight:  [78.336 kg (172 lb 11.2 oz)] 78.336 kg (172 lb 11.2 oz) (02/07 1404)  Intake/Output from previous day: 02/07 0701 - 02/08 0700 In: 1080 [P.O.:1080] Out: -  Intake/Output from this shift:    Physical Exam: Neck: no adenopathy, no carotid bruit, no JVD and supple, symmetrical, trachea midline Lungs: clear to auscultation bilaterally Heart: regular rate and rhythm, S1, S2 normal, no murmur, click, rub or gallop Abdomen: soft, non-tender; bowel sounds normal; no masses,  no organomegaly Extremities: extremities normal, atraumatic, no cyanosis or edema  Lab Results:  Recent Labs  12/19/12 1750  WBC 9.2  HGB 13.4  PLT 303    Recent Labs  12/19/12 1750 12/20/12 0630  NA 140 140  K 3.4* 3.5  CL 99 103  CO2 33* 30  GLUCOSE 94 94  BUN 10 14  CREATININE 0.70 0.73    Recent Labs  12/20/12 0630 12/20/12 1254  TROPONINI <0.30 <0.30   Hepatic Function Panel No results found for this basename: PROT, ALBUMIN, AST, ALT, ALKPHOS, BILITOT, BILIDIR, IBILI,  in the last 72 hours  Recent Labs  12/20/12 0630  CHOL 187   No results found for this basename: PROTIME,  in the last 72 hours  Imaging: Imaging results have been reviewed and Dg Chest 2 View  12/19/2012  *RADIOLOGY REPORT*  Clinical Data: Chest pain  CHEST - 2 VIEW  Comparison: 04/11/2012  Findings:  Stable chronic interstitial markings with bilateral lower lobe scarring.  No focal consolidation. No pleural effusion or pneumothorax.  Heart is normal in size.  Mild degenerative changes of the visualized thoracolumbar spine.  IMPRESSION: No evidence of acute cardiopulmonary disease.  Stable chronic interstitial markings/scarring.   Original Report Authenticated By: Charline Bills, M.D.     Cardiac Studies:  Assessment/Plan:  Status post Recurrent chest pain rule out coronary insufficiency MI ruled out  Hypertension  Sarcoidosis  GERD  Anemia  Remote tobacco abuse  Plan Continue present management Okay to discharge from cardiac point of view if Myoview scan shows no evidence of reversible ischemia  LOS: 2 days    [name] N 12/21/2012, 11:07 AM

## 2012-12-21 NOTE — Progress Notes (Signed)
Family Medicine Teaching Service Daily Progress Note Service Page: [phone]  Subjective:  No chest pain. Did get SOB when trying to exercise today for treadmill but otherwise no dyspnea, no other complaints.   Objective: Temp:  [98.1 F (36.7 C)-98.5 F (36.9 C)] 98.1 F (36.7 C) (02/08 0500) Pulse Rate:  [77-92] 77 (02/08 0500) Resp:  [18] 18 (02/08 0500) BP: (115-122)/(71-76) 122/76 mmHg (02/08 0500) SpO2:  [95 %-96 %] 96 % (02/08 0724) Weight:  [172 lb 11.2 oz (78.336 kg)] 172 lb 11.2 oz (78.336 kg) (02/07 1404)  Exam:  Gen: NAD, alert, cooperative with exam HEENT: NCAT, EOMI, PERRL CV: RRR, good S1/S2, no murmur Resp: CTABL, no wheezes, non-labored Abd: SNTND, BS present, no guarding or organomegaly Ext: No edema, warm Neuro: Alert and oriented, No gross deficits  .med I have reviewed the patient's medications, labs, imaging, and diagnostic testing.  Notable results are summarized below.  CBC BMET   Recent Labs Lab 12/19/12 1750  WBC 9.2  HGB 13.4  HCT 39.8  PLT 303    Recent Labs Lab 12/19/12 1750 12/20/12 0630  NA 140 140  K 3.4* 3.5  CL 99 103  CO2 33* 30  BUN 10 14  CREATININE 0.70 0.73  GLUCOSE 94 94  CALCIUM 9.5 9.2       12/20/2012 06:30  Cholesterol 187  Triglycerides 139  HDL 53  LDL (calc) 106 (H)  VLDL 28  Total CHOL/HDL Ratio 3.5  TSH: 1.29     Recent Labs Lab 12/19/12 2135 12/20/12 0045 12/20/12 0630 12/20/12 1254  TROPONINI <0.30 <0.30 <0.30 <0.30     Imaging/Diagnostic Tests: CXR 12/19/2012 IMPRESSION:  No evidence of acute cardiopulmonary disease.  Stable chronic interstitial markings/scarring.  Plan: 52 y.o. year old female with PMH of HTN and sarcoidosis with a three-day onset of chest pain. Negative cardiac troponin and no signs of active ischemia on EKG in the emergency department  Chest pain - atypical. Exertional and relieved by nitro, - Troponins negative X 3, EKGs without ischemic changes.  - Consulted  cardiology (appreciate recs)  - lexiscan myoview EF 71% and no reversible ischemia or infarction. Normal wall motion. Normal study.  -Echo with mild LVH and EF 60%. No restrictive disease noted (concern was due to sarcoid) - LDL elevated - pravastatin started and to be continued outpatient.  - Daily aspirin - on outpatient basis, patient with 8mm nodule and recommendations were q6-12 month follow up. Possible cause of chest pain though no large expanding nodule noted on CXR.   HTN - well controlled majority of the time.  - continue home HCTZ  Sarcoid - followed by pulmonology OP, continue symbicort and albuterol prn  GERD - This pain unlike previous GERD - Continue PPI  Access to meds- states she has a hard time affording meds- consulted care mngmt for help. Can get meds for $3 and patient given info by care management.   FEN/GI: Heart healthy, SLIV PPx: heparin Dispo: d/c today.  Code Status: Full  Tana Conch, MD 12/21/2012, 9:57 AM

## 2012-12-27 ENCOUNTER — Ambulatory Visit: Payer: Self-pay | Admitting: Family Medicine

## 2013-01-09 ENCOUNTER — Ambulatory Visit (INDEPENDENT_AMBULATORY_CARE_PROVIDER_SITE_OTHER): Payer: Self-pay | Admitting: Family Medicine

## 2013-01-09 VITALS — BP 129/81 | HR 71 | Temp 99.1°F | Ht 65.0 in | Wt 173.0 lb

## 2013-01-09 DIAGNOSIS — M25519 Pain in unspecified shoulder: Secondary | ICD-10-CM

## 2013-01-09 DIAGNOSIS — I10 Essential (primary) hypertension: Secondary | ICD-10-CM

## 2013-01-09 DIAGNOSIS — B370 Candidal stomatitis: Secondary | ICD-10-CM

## 2013-01-09 DIAGNOSIS — R911 Solitary pulmonary nodule: Secondary | ICD-10-CM

## 2013-01-09 DIAGNOSIS — M25512 Pain in left shoulder: Secondary | ICD-10-CM

## 2013-01-09 MED ORDER — TRAMADOL HCL 50 MG PO TABS: 50.0000 mg | ORAL_TABLET | Freq: Three times a day (TID) | ORAL | Status: DC | PRN

## 2013-01-09 MED ORDER — FLUCONAZOLE 150 MG PO TABS: 150.0000 mg | ORAL_TABLET | Freq: Once | ORAL | Status: DC

## 2013-01-09 MED ORDER — NYSTATIN 100000 UNIT/ML MT SUSP: OROMUCOSAL | Status: DC

## 2013-01-09 NOTE — Assessment & Plan Note (Addendum)
At goal today on beta blocker. No changes. Her meds were all sent to [facility] pharmacy, so I advised her to get refills there because on discount list after her current supply runs out.

## 2013-01-09 NOTE — Assessment & Plan Note (Signed)
Will plan to repeat CT pulmonary nodule study after she gets orange card. She has sarcoidosis but does have a remote history of cigarette smoking.

## 2013-01-09 NOTE — Progress Notes (Signed)
  Subjective:    Patient ID: [name], female    DOB: Mar 03, 1961, 52 y.o.   MRN: [id number]  HPI Hospital f/u DC 2/8 for chest pain.  1. CP. Has not recurred since discharge. She underwent a normal DEXA scan mild this stress test. She is now taking a daily baby aspirin, beta blocker and omeprazole. She wants to restart exercise, but has not done this yet. Denies severe reflux, nausea, epigastric pain, chest pain, dyspnea, cough.  2. pulmonary nodule. On review of her records during hospital stay, it was discovered that she had an 8 mm left pulmonary nodule on a CT scan done over one year ago. Recommended outpatient followup in 6-12 months. Patient does have known sarcoidosis and a remote history of smoking, quit 8 years ago. She is awaiting an orange card and does not have medical insurance currently.  3. Thrush and vaginal yeast. Patient restarted on Symbicort during hospital stay area she could not afford this without insurance previous to that, but was given a 3 daughter supply of her medications. She has not been rinsing her mouth well and does report some oral thrush. Notably she also has a itching and white vaginal discharge consistent with the infection. She states she took some leftover antibiotics for a cold that she had a home in the past 3 weeks and believes is triggered the vaginal yeast. She refuses STD screening and exam. She has never had an A1c performed, though her previous random glucose testing has all been less than 100. She denies polyuria, polydipsia, polyphagia, dysuria.  4. left shoulder pain. Patient mentioned some left shoulder pain she would like to be evaluated, but we discussed deferring this to a future time, when we have more time and can order imaging after she gets her orange card. 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 They gave her tramadol in hospital which seemed to help. She has full ROM.  Review of Systems See HPI otherwise negative.  reports that  she quit smoking about 9 years ago. She has never used smokeless tobacco.     Objective:   Physical Exam  Vitals reviewed. Constitutional: She appears well-developed and well-nourished. No distress.  HENT:  Head: Normocephalic and atraumatic.  Bilateral pedal mucosa has a few superficial white plaques.  Eyes: EOM are normal.  Neck: Neck supple.  Cardiovascular: Normal rate, regular rhythm and normal heart sounds.   No murmur heard. Pulmonary/Chest: Effort normal and breath sounds normal. No respiratory distress. She has no wheezes. She has no rales.  Musculoskeletal:  Left shoulder active ROM intact. Some crepitus in shoulder noted.   Skin: She is not diaphoretic.  Psychiatric: She has a normal mood and affect.          Assessment & Plan:

## 2013-01-09 NOTE — Assessment & Plan Note (Signed)
She has failed on OTC vaginal suppository and has oral thrush. Will treat with fluconazole at this point. Discussed meticulous rinsing after inhaled steroid. Do not suspect glucose intolerance is contributing given her low normal CBGs during hospitalization.

## 2013-01-09 NOTE — Assessment & Plan Note (Signed)
We discussed postponing further evaluation pending orange card. She demonstrated full ROM and advised her to try some tylenol or motrin (not daily) for pain as needed, or tramadol at its worst. F/u in 2-4 weeks for trial of injection or xray.

## 2013-01-09 NOTE — Patient Instructions (Addendum)
Keep trying to get orange card. [name] is doing her best. Pick up your medications at [facility] when you need them. Please call if prescriptions are mixed up. Try tramadol for shoulder pain. Try diflucan for your yeast infection.  Make appointment in one month for check of your shoulder.  Will get a CT scan of your chest to check when you get orange card. Will send to sports medicine or try shoulder injection when you get orange card.

## 2013-01-29 ENCOUNTER — Encounter: Payer: Self-pay | Admitting: Internal Medicine

## 2013-01-29 ENCOUNTER — Ambulatory Visit (INDEPENDENT_AMBULATORY_CARE_PROVIDER_SITE_OTHER): Payer: Self-pay | Admitting: Internal Medicine

## 2013-01-29 VITALS — BP 130/90 | HR 64 | Temp 98.5°F | Ht 65.0 in | Wt 176.0 lb

## 2013-01-29 DIAGNOSIS — J309 Allergic rhinitis, unspecified: Secondary | ICD-10-CM

## 2013-01-29 DIAGNOSIS — J449 Chronic obstructive pulmonary disease, unspecified: Secondary | ICD-10-CM

## 2013-01-29 DIAGNOSIS — J4489 Other specified chronic obstructive pulmonary disease: Secondary | ICD-10-CM

## 2013-01-29 MED ORDER — PREDNISONE (PAK) 10 MG PO TABS: ORAL_TABLET | ORAL | Status: DC

## 2013-01-29 MED ORDER — OMEPRAZOLE 20 MG PO CPDR: DELAYED_RELEASE_CAPSULE | ORAL | Status: DC

## 2013-01-29 NOTE — Patient Instructions (Addendum)
Whenever you start having problems with your breathing/coughing/wheezing/sinus/ ears/ chest pain go ahead and increase the prilosec to Take 30- 60 min before your first and last meals of the day   [email or web address] is your source for generic medications, cash only though(no insurance)  Prednisone 10 mg take  4 each am x 2 days,   2 each am x 2 days,  1 each am x2days and stop   For cough use mucinex  Or mucinex dm  I emphasized that nasal steroids have no immediate benefit in terms of improving symptoms.  To help them reached the target tissue, the patient should use Afrin two puffs every 12 hours applied one min before using the nasal steroids.  Afrin should be stopped after no more than 5 days.  If the symptoms worsen, Afrin can be restarted after 5 days off of therapy to prevent rebound congestion from overuse of Afrin.  I also emphasized that in no way are nasal steroids a concern in terms of "addiction".    Pulmonary follow up as needed

## 2013-01-29 NOTE — Progress Notes (Signed)
Subjective:     Patient ID: [name], female   DOB: 04-21-1961    MRN: [id number]  Brief patient profile:  51 yobf quit smoking 10/2004 dx by transbronchial biopsy 5/92 with NCG inflammatory change consistent with sarcoid. Since that time she's been off and on prednisone multiple  times with symptoms of coughing dyspnea and skin involvement > weaned off chronic prednisone Feb 2012.   Overall she's been doing much better since she stopped smoking 10/2004 and much better since change to Symbicort 160/4.5 2 two times a day, able to do adl's ok without limitations including some steps.     June 26, 2008 returns for PFT's still prednisone 5mg  one daily plus symbicort 160, no new problems > no change rx    March 02, 2010 6 wk followup with PFT's. Pt states that her breathing is much better. rec add spriva trial.   October 27, 2010 ov @ 5 mg/day cc cough better, breathing better walking up to 2 miles a day on the job . >>rec pred 5mg  1/2 every other day   Stopped prednisone even the qod late Feb 2012 > no flare of symptoms or nausea/fatigue so remained off   05/03/2011 ov/ Icholas Irby no flare of cough or sob off prednisone.  No ocular or articular symptoms, no rash.  rec try off spiriva and just use the proaire prn.  06/05/2011 ov/Dayane Hillenburg worse off spiriva but did not restart it,  Using proaire daily then sick x 3-4 days with green sputum and increased need for proaire for sob. Not using hfa optimally. Assoc with nasal congestion and ear pain bilaterally. No longer using ppi but denies overt HB  rec approp use of nasal steroids reviewed Prednisone 10 mg take  4 each am x 2 days,   2 each am x 2 days,  1 each am x2days and stop Zpack called in Work on inhaler technique:   Leave off spiriva for now  Try prilosec 20mg   Take 30-60 min before first meal of the day and Pepcid 20 mg one bedtime  06/29/2011 f/u ov/Leandre Wien cc variable sob esp in am but no longer green sputum, sinuses feel clear.  Back on  prednisone since 8/15 for predominantly rhinitis flare. No more cough and overall back to baseline x occ sob not reproducible with exercise. rec Pepcid 20 mg one at bedtime GERD diet     08/01/2011 f/u ov/Tekoa Amon cc breathing the best it's been in a long time. No limitations or cough or daytime saba rescue >no changes   08/24/2011 Acute OV  PT complains of wheezing x 2 days.  sob with activity x 2 days--no cough but some sneezing. Some drainage at times. No fever or discolored mucus. Does not take symbicort on regular basis.  No otc used. No chest pain or edema.   rec Remain on Symbicort regularly everyday w/ no missed doses.  Prednisone taper over next week.  Please contact office for sooner follow up if symptoms do not improve or worsen or seek emergency care  follow up Dr. [name]  In 6 weeks> did not make appt  01/15/2012 f/u ov/Theta Leaf cc Pt c/o prod cough with thick, green sputum x 1 wk. She also states has slight increase SOB and chest tightness. Called in zpak and prednisone > Using rescue q 4-5 hours but improving vs onset and plans tooth extraction on 3/7. No fever or lateralizing cp, sputum turning llighter. rec Inhaler technique poor > reviewed   04/24/2012 f/u ov/Anselm Aumiller  cc sore mouth x weeks, indolent ponset/ persistent, given diflucan pill x one minimally better,  No sob/ cough.  rec Diflucan 100 mg daily x 7 days - if mouth not better see Dr Warren Danes Reduce symbicort 160 to 2 puffs each am but increase back to twice daily if breathing or coughing or need for rescue inhaler worsen Work on inhaler technique   06/28/2012 f/u ov/Briyah Wheelwright cc pt c/o wheezing, and increased SOB x 59month since eye surg.. pt states that she is having to use her HFA more frequently > up to twice daily.  No unusual cough, purulent sputum or sinus/hb symptoms on present rx. Throat feels irritated since put to sleep for eye surgery and not using ppi as rec.  rec Restart prilosec (omeprazole) Take 30- 60 min before  your first and last meals of the day  Prednisone 10 mg take  4 each am x 2 days,   2 each am x 2 days,  1 each am x2days and stop  GERD diet  08/13/2012 f/u ov/Jorie Zee cc missed appt  08/30/2012 f/u ov/Exzavier Ruderman cc better since last ov until 2 days prior to OV  With increase wheeze and cough and not using ppi qd ac. rec Whenever you start having problems with your breathing/coughing/wheezing go ahead and increase the prilosec to Take 30- 60 min before your first and last meals of the day  Prednisone 10 mg take  4 each am x 2 days,   2 each am x 2 days,  1 each am x2days and stop  For cough use mucinex    11/28/2012 f/u ov/Michaell Grider cc Worse sob  x 3 days with wheezing and cough-occ prod with yellow sputum assoc with nasal congestion off flonase cause can't afford it, filing for disability.  rec Whenever you start having problems with your breathing/coughing/wheezing go ahead and increase the prilosec to Take 30- 60 min before your first and last meals of the day  Prednisone 10 mg take  4 each am x 2 days,   2 each am x 2 days,  1 each am x2days and stop  For cough use mucinex  Or mucinex dm Please see patient coordinator before you leave today for [employer] assistance for Symbicort 160   01/29/2013 f/u ov/Ananda Sitzer cc nasal congestion much better p prednisone and then worse off it with cp > neg cardiac w/u, did not activate the action plan prev outlined  No obvious daytime variabilty or assoc cp or chest tightness,   overt   hb symptoms. No unusual exp hx. Some better p saba   Sleeping ok without nocturnal  or early am exacerbation  of respiratory  c/o's or need for noct saba. Also denies any obvious fluctuation of symptoms with weather or environmental changes or other aggravating or alleviating factors except as outlined above   ROS  The following are not active complaints unless bolded sore throat, dysphagia, dental problems, itching, sneezing,  nasal congestion worse off flonase or excess/ purulent  secretions, ear ache,   fever, chills, sweats, unintended wt loss, pleuritic or exertional cp, hemoptysis,  orthopnea pnd or leg swelling, presyncope, palpitations, heartburn, abdominal pain, anorexia, nausea, vomiting, diarrhea  or change in bowel or urinary habits, change in stools or urine, dysuria,hematuria,  rash, arthralgias, visual complaints, headache, numbness weakness or ataxia or problems with walking or coordination,  change in mood/affect or memory.            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 Marga Melnick  -h/o Daily prednisone since 2007   > off completely Feb 2012 with no problem ? 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 (ICD-280.9)  Health Maintenance.......................................................   [facility]   Family History:  crohn`s in daughter, M-lupus, htn, asthma,  no Ca, DM, CAD   Social History:   lives with twin children and grandson. single; >20 pack year history, quit in 2005; no EtOH  Laid off  from works for [employer] Sept 2013              Objective:   Physical Exam wt 166 Oct 12, 2009 >170 October 28, 2010 > 169 06/29/2011 >>171 08/24/2011 > 01/15/2012  171 > 04/24/2012 176 > 06/28/2012  175> 08/13/2012 > 08/30/2012  173 > 170 11/28/2012 > 01/29/2013  176  amb bf nad  HEENT  mild turbinate edema. Oropharynx clear  but no excess pnd or cobblestoning. No JVD or cervical adenopathy. Mild accessory muscle hypertrophy. Trachea midline, nl thryroid. Lungs good air movement no wheeze.  Regular rate and rhythm without murmur gallop or rub or increase P2 or edema. Abd: no hsm, nl excursion. Ext warm without cyanosis or clubbing.     CXR  12/19/12  No evidence of acute cardiopulmonary disease.  Stable chronic interstitial markings/scarring.        Assessment:         Plan:

## 2013-01-30 NOTE — Assessment & Plan Note (Signed)
I emphasized that nasal steroids have no immediate benefit in terms of improving symptoms.  To help them reached the target tissue, the patient should use Afrin two puffs every 12 hours applied one min before using the nasal steroids.  Afrin should be stopped after no more than 5 days.  If the symptoms worsen, Afrin can be restarted after 5 days off of therapy to prevent rebound congestion from overuse of Afrin.  I also emphasized that in no way are nasal steroids a concern in terms of "addiction".  No further pulmonary f/u needed

## 2013-02-01 NOTE — Assessment & Plan Note (Signed)
-   PFT's 06/26/08 FEV1 1.09 ratio  - PFTs 03/02/10 FEV1 1.06 ratio 34    PFT's 08/01/2011 FEV1 1.6 (42%)  41 ratio  DLCO 55 corrects 77 - HFA 75% 01/15/2012  - Spiriva trial March 02, 2010 > ? better but not worse off it 04/2011  Adequate control on present rx, reviewed     Each maintenance medication was reviewed in detail including most importantly the difference between maintenance and as needed and under what circumstances the prns are to be used.  Please see instructions for details which were reviewed in writing and the patient given a copy.

## 2013-02-06 ENCOUNTER — Ambulatory Visit (INDEPENDENT_AMBULATORY_CARE_PROVIDER_SITE_OTHER): Payer: Self-pay | Admitting: Family Medicine

## 2013-02-06 ENCOUNTER — Encounter: Payer: Self-pay | Admitting: Family Medicine

## 2013-02-06 VITALS — BP 136/75 | HR 65 | Temp 99.2°F | Ht 65.0 in | Wt 178.0 lb

## 2013-02-06 DIAGNOSIS — R062 Wheezing: Secondary | ICD-10-CM

## 2013-02-06 DIAGNOSIS — I10 Essential (primary) hypertension: Secondary | ICD-10-CM

## 2013-02-06 DIAGNOSIS — K219 Gastro-esophageal reflux disease without esophagitis: Secondary | ICD-10-CM

## 2013-02-06 DIAGNOSIS — M25519 Pain in unspecified shoulder: Secondary | ICD-10-CM

## 2013-02-06 DIAGNOSIS — R911 Solitary pulmonary nodule: Secondary | ICD-10-CM

## 2013-02-06 DIAGNOSIS — D869 Sarcoidosis, unspecified: Secondary | ICD-10-CM

## 2013-02-06 DIAGNOSIS — M25512 Pain in left shoulder: Secondary | ICD-10-CM

## 2013-02-06 MED ORDER — ALBUTEROL SULFATE (2.5 MG/3ML) 0.083% IN NEBU
2.5000 mg | INHALATION_SOLUTION | Freq: Once | RESPIRATORY_TRACT | Status: AC
  Administered 2013-02-06: 2.5 mg via RESPIRATORY_TRACT

## 2013-02-06 MED ORDER — IPRATROPIUM BROMIDE 0.02 % IN SOLN
0.5000 mg | Freq: Once | RESPIRATORY_TRACT | Status: AC
  Administered 2013-02-06: 0.5 mg via RESPIRATORY_TRACT

## 2013-02-06 MED ORDER — OMEPRAZOLE 20 MG PO CPDR: DELAYED_RELEASE_CAPSULE | ORAL | Status: DC

## 2013-02-06 NOTE — Patient Instructions (Signed)
The wheeze went away when I listened. Your BP is good today. Goal is <140/90. If you develop fever, cough, shortness of breath then call for appointment.  Try to do more walking for exercise.  If you decide you want something for your shoulder, we can try a shot.

## 2013-02-07 NOTE — Assessment & Plan Note (Signed)
Will repeat CT surveillance pending insurance coverage, for left pulmonary incidental nodule. Not likely related to her intermittent wheeze. Had negative CXR recently, so not likely enlarging rapidly.

## 2013-02-07 NOTE — Assessment & Plan Note (Signed)
Printed rx for pt to take to a discount pharmacy.

## 2013-02-07 NOTE — Assessment & Plan Note (Signed)
Will defer further evaluation per patient request unless worsens.

## 2013-02-07 NOTE — Assessment & Plan Note (Signed)
At goal. F/u in 3-6 months.

## 2013-02-07 NOTE — Assessment & Plan Note (Signed)
Today had isolated wheeze that cleared with deep breathing, no respiratory distress or dyspnea. She requests a neb treatment today, which didn't change much. She will continue prednisone taper per Dr. [name]. Reassured patient, advised to f/u if symptoms worsen, or she develops sign of infection.

## 2013-02-07 NOTE — Progress Notes (Signed)
  Subjective:    Patient ID: [name], female    DOB: 1961/06/20, 52 y.o.   MRN: [id number]  HPI  1. F/u BP. Compliant with meds. Reports good control at home.   2. Wheeze/COPD. She reports a right side wheeze intermittently. Has albuterol at home, doesn't seem to change the mild wheeze. Overall she feels well. Compliant with inhalers, just saw Dr. [name] last week who recommended she increase her PPI to BID.  No chest pain, dyspnea, cough, fever, chills, abdominal pain, fatigue, weakness.  She has an incidental left pulmonary nodule 8 mm due for CT surveillance (awaiting orange card or insurance coverage prior to ordering the scan).  3. Left shoulder pain. Patient is coping with the mild pain symptoms. She does not wish to discuss further evaluation today, refuses consideration for injection or therapy.   Review of Systems See HPI otherwise negative.  reports that she quit smoking about 9 years ago. She has never used smokeless tobacco.     Objective:   Physical Exam  Vitals reviewed. Constitutional: She is oriented to person, place, and time. She appears well-developed and well-nourished. No distress.  HENT:  Head: Normocephalic and atraumatic.  Mouth/Throat: Oropharynx is clear and moist. No oropharyngeal exudate.  Neck: Neck supple.  Cardiovascular: Normal rate, regular rhythm and normal heart sounds.   No murmur heard. Pulmonary/Chest: Effort normal.  A right faint inspiratory wheeze noted, this clears spontaneously with repeated deep breaths.  Good air movement bilaterally.  Musculoskeletal: She exhibits no edema.  Lymphadenopathy:    She has no cervical adenopathy.  Neurological: She is alert and oriented to person, place, and time.  Skin: No rash noted. She is not diaphoretic.        Assessment & Plan:

## 2013-04-25 ENCOUNTER — Telehealth: Payer: Self-pay | Admitting: Internal Medicine

## 2013-04-25 NOTE — Telephone Encounter (Signed)
Spoke to pt.  She is aware that we do not have any samples at this time. Offered to send in a prescription but states it will cost her $200 and she can't afford that. I advised that we could talk to Dr. [name] and see if we could change the medication. States that she will check back with [place] next week to see if we have any samples. Advised her that she can't live off of samples because we might not have them when she needs them. She doesn't want to change inhalers, states that she will just call back.

## 2013-04-28 ENCOUNTER — Other Ambulatory Visit: Payer: Self-pay | Admitting: Family Medicine

## 2013-04-28 DIAGNOSIS — Z1231 Encounter for screening mammogram for malignant neoplasm of breast: Secondary | ICD-10-CM

## 2013-04-30 ENCOUNTER — Telehealth: Payer: Self-pay | Admitting: Internal Medicine

## 2013-04-30 MED ORDER — BUDESONIDE-FORMOTEROL FUMARATE 160-4.5 MCG/ACT IN AERO: 2.0000 | INHALATION_SPRAY | Freq: Two times a day (BID) | RESPIRATORY_TRACT | Status: DC

## 2013-04-30 NOTE — Telephone Encounter (Signed)
Spoke with patient, patient requesting sample of symbicort Sample placed upfront for pickup Nothing further needed at this time.

## 2013-05-06 ENCOUNTER — Ambulatory Visit (HOSPITAL_COMMUNITY)
Admission: RE | Admit: 2013-05-06 | Discharge: 2013-05-06 | Disposition: A | Discharge status: Home / Self Care | Payer: BC Managed Care – PPO | Source: Ambulatory Visit | Attending: Family Medicine | Admitting: Family Medicine

## 2013-05-06 DIAGNOSIS — Z1231 Encounter for screening mammogram for malignant neoplasm of breast: Secondary | ICD-10-CM | POA: Insufficient documentation

## 2013-05-08 ENCOUNTER — Other Ambulatory Visit (HOSPITAL_COMMUNITY)
Admission: RE | Admit: 2013-05-08 | Discharge: 2013-05-08 | Disposition: A | Discharge status: Home / Self Care | Payer: BC Managed Care – PPO | Source: Ambulatory Visit | Attending: Family Medicine | Admitting: Family Medicine

## 2013-05-08 ENCOUNTER — Ambulatory Visit (INDEPENDENT_AMBULATORY_CARE_PROVIDER_SITE_OTHER): Payer: BC Managed Care – PPO | Admitting: Family Medicine

## 2013-05-08 ENCOUNTER — Encounter: Payer: Self-pay | Admitting: Family Medicine

## 2013-05-08 VITALS — BP 120/69 | HR 72 | Temp 99.5°F | Ht 65.0 in | Wt 171.0 lb

## 2013-05-08 DIAGNOSIS — N898 Other specified noninflammatory disorders of vagina: Secondary | ICD-10-CM

## 2013-05-08 DIAGNOSIS — J449 Chronic obstructive pulmonary disease, unspecified: Secondary | ICD-10-CM

## 2013-05-08 DIAGNOSIS — E785 Hyperlipidemia, unspecified: Secondary | ICD-10-CM

## 2013-05-08 DIAGNOSIS — Z113 Encounter for screening for infections with a predominantly sexual mode of transmission: Secondary | ICD-10-CM | POA: Insufficient documentation

## 2013-05-08 LAB — POCT WET PREP (WET MOUNT)

## 2013-05-08 NOTE — Assessment & Plan Note (Addendum)
Check wet prep and GC/ch. F/u results with treatment as needed. No need for further pap with hysterectomy.

## 2013-05-08 NOTE — Assessment & Plan Note (Signed)
Calculated risk level based on new guidelines 10 yr risk was 3.3 percent, does not require statin med. Will DC due to cost and not indicated with normal stress test this year.

## 2013-05-08 NOTE — Assessment & Plan Note (Signed)
Could consider repeat bone density testing if she becomes more reliant on steroids, normal 2008. Recommend increase calcium and vitamin D intake for now.

## 2013-05-08 NOTE — Progress Notes (Signed)
  Subjective:     [name] is a 52 y.o. female and is here for a comprehensive physical exam. The patient reports problems - having white vaginal discharge. she used an intravaginal yeast treatment several weeks ago. she requests STD screening though thinks she is low risk. no pelvic pain or bleeding.   Also complains of some right ear pressure and irritation in right side throat, post nasal drip for past 6-7 days. No fever, chills, facial pain.  Has hx of CIN-1 and conization, also reports a total hysterctomy 3 years ago. Was told she didn't need paps anymore. Done by Dr Tamela Oddi. Had normal mammogram recently, colonoscopy in 2011, bone density in 2008.   Has pulm sarcodosis and treated with 3 x 1 week prednisone bursts in past year. Nonsmoker.  HLD. Stopped taking pravastatin due to cost. This was started during hospitalization for chest pain rule out which no cardiac abnormalities were discovered, had normal lexiscan Feb 2014. No further chest pain since then. Patient follows with Dr. [name] for pulmonary sarcoidosis.  History   Social History  . Marital Status: Single    Spouse Name: N/A    Number of Children: N/A  . Years of Education: N/A   Occupational History  . Not on file.   Social History Main Topics  . Smoking status: Former Smoker -- 1.00 packs/day for 20 years    Quit date: 11/14/2003  . Smokeless tobacco: Never Used  . Alcohol Use: No  . Drug Use: No  . Sexually Active: Not on file   Other Topics Concern  . Not on file   Social History Narrative   Lives with mother and his 3 children.  Works as a Psychologist, educational for sewing Administrator, arts)         Health Maintenance  Topic Date Due  . Influenza Vaccine  07/14/2013  . Pap Smear  03/28/2014  . Tetanus/tdap  06/13/2014  . Mammogram  05/07/2015  . Colonoscopy  03/29/2019    The following portions of the patient's history were reviewed and updated as appropriate: allergies, current medications, past family  history, past medical history, past social history, past surgical history and problem list.  Review of Systems Pertinent items are noted in HPI.   Objective:    BP 120/69  Pulse 72  Temp(Src) 99.5 F (37.5 C) (Oral)  Ht 5\' 5"  (1.651 m)  Wt 171 lb (77.565 kg)  BMI 28.46 kg/m2 General appearance: alert, cooperative, appears stated age and no distress Head: Normocephalic, without obvious abnormality, atraumatic Eyes: conjunctivae/corneas clear. PERRL, EOM's intact.  Ears: normal TM's and external ear canals both ears Nose: Nares normal. Septum midline. Mucosa normal. No drainage or sinus tenderness. Throat: abnormal findings: right>left pharyngeal erythema Neck: no adenopathy and thyroid not enlarged, symmetric, no tenderness/mass/nodules Lungs: clear to auscultation bilaterally Heart: regular rate and rhythm, S1, S2 normal, no murmur, click, rub or gallop Abdomen: soft, non-tender; bowel sounds normal; no masses,  no organomegaly Pelvic: external genitalia normal, rectovaginal septum normal, uterus normal size, shape, and consistency and vaginal cuff appears normal, copious white vaginal discharge Extremities: extremities normal, atraumatic, no cyanosis or edema Skin: Skin color, texture, turgor normal. No rashes or lesions Neurologic: Grossly normal    Assessment:    Healthy female exam. Pulmonary sarcoidosis. Hx of TAH. Viral sinusitis.      Plan:     See After Visit Summary for Counseling Recommendations

## 2013-05-08 NOTE — Patient Instructions (Addendum)
Make sure to take calcium 1200 mg daily and vitamin D [phone] mg daily.  Use salt water gargles and saline nasal spray three times daily. If your sinus problems worsen or fail to improve, follow up in clinic.  Make an appointment in 3-4 months for check up and lab work.  Health Maintenance, Females A healthy lifestyle and preventative care can promote health and wellness.  Maintain regular health, dental, and eye exams.  Eat a healthy diet. Foods like vegetables, fruits, whole grains, low-fat dairy products, and lean protein foods contain the nutrients you need without too many calories. Decrease your intake of foods high in solid fats, added sugars, and salt. Get information about a proper diet from your caregiver, if necessary.  Regular physical exercise is one of the most important things you can do for your health. Most adults should get at least 150 minutes of moderate-intensity exercise (any activity that increases your heart rate and causes you to sweat) each week. In addition, most adults need muscle-strengthening exercises on 2 or more days a week.   Maintain a healthy weight. The body mass index (BMI) is a screening tool to identify possible weight problems. It provides an estimate of body fat based on height and weight. Your caregiver can help determine your BMI, and can help you achieve or maintain a healthy weight. For adults 20 years and older:  A BMI below 18.5 is considered underweight.  A BMI of 18.5 to 24.9 is normal.  A BMI of 25 to 29.9 is considered overweight.  A BMI of 30 and above is considered obese.  Maintain normal blood lipids and cholesterol by exercising and minimizing your intake of saturated fat. Eat a balanced diet with plenty of fruits and vegetables. Blood tests for lipids and cholesterol should begin at age 46 and be repeated every 5 years. If your lipid or cholesterol levels are high, you are over 50, or you are a high risk for heart disease, you may  need your cholesterol levels checked more frequently.Ongoing high lipid and cholesterol levels should be treated with medicines if diet and exercise are not effective.  If you smoke, find out from your caregiver how to quit. If you do not use tobacco, do not start.  If you are pregnant, do not drink alcohol. If you are breastfeeding, be very cautious about drinking alcohol. If you are not pregnant and choose to drink alcohol, do not exceed 1 drink per day. One drink is considered to be 12 ounces (355 mL) of beer, 5 ounces (148 mL) of wine, or 1.5 ounces (44 mL) of liquor.  Avoid use of street drugs. Do not share needles with anyone. Ask for help if you need support or instructions about stopping the use of drugs.  High blood pressure causes heart disease and increases the risk of stroke. Blood pressure should be checked at least every 1 to 2 years. Ongoing high blood pressure should be treated with medicines, if weight loss and exercise are not effective.  If you are 69 to 53 years old, ask your caregiver if you should take aspirin to prevent strokes.  Diabetes screening involves taking a blood sample to check your fasting blood sugar level. This should be done once every 3 years, after age 50, if you are within normal weight and without risk factors for diabetes. Testing should be considered at a younger age or be carried out more frequently if you are overweight and have at least 1 risk factor for  diabetes.  Breast cancer screening is essential preventative care for women. You should practice "breast self-awareness." This means understanding the normal appearance and feel of your breasts and may include breast self-examination. Any changes detected, no matter how small, should be reported to a caregiver. Women in their 55s and 30s should have a clinical breast exam (CBE) by a caregiver as part of a regular health exam every 1 to 3 years. After age 20, women should have a CBE every year. Starting at  age 52, women should consider having a mammogram (breast X-ray) every year. Women who have a family history of breast cancer should talk to their caregiver about genetic screening. Women at a high risk of breast cancer should talk to their caregiver about having an MRI and a mammogram every year.  The Pap test is a screening test for cervical cancer. Women should have a Pap test starting at age 16. Between ages 51 and 81, Pap tests should be repeated every 2 years. Beginning at age 52, you should have a Pap test every 3 years as long as the past 3 Pap tests have been normal. If you had a hysterectomy for a problem that was not cancer or a condition that could lead to cancer, then you no longer need Pap tests. If you are between ages 38 and 63, and you have had normal Pap tests going back 10 years, you no longer need Pap tests. If you have had past treatment for cervical cancer or a condition that could lead to cancer, you need Pap tests and screening for cancer for at least 20 years after your treatment. If Pap tests have been discontinued, risk factors (such as a new sexual partner) need to be reassessed to determine if screening should be resumed. Some women have medical problems that increase the chance of getting cervical cancer. In these cases, your caregiver may recommend more frequent screening and Pap tests.  The human papillomavirus (HPV) test is an additional test that may be used for cervical cancer screening. The HPV test looks for the virus that can cause the cell changes on the cervix. The cells collected during the Pap test can be tested for HPV. The HPV test could be used to screen women aged 73 years and older, and should be used in women of any age who have unclear Pap test results. After the age of 17, women should have HPV testing at the same frequency as a Pap test.  Colorectal cancer can be detected and often prevented. Most routine colorectal cancer screening begins at the age of 20 and  continues through age 48. However, your caregiver may recommend screening at an earlier age if you have risk factors for colon cancer. On a yearly basis, your caregiver may provide home test kits to check for hidden blood in the stool. Use of a small camera at the end of a tube, to directly examine the colon (sigmoidoscopy or colonoscopy), can detect the earliest forms of colorectal cancer. Talk to your caregiver about this at age 27, when routine screening begins. Direct examination of the colon should be repeated every 5 to 10 years through age 31, unless early forms of pre-cancerous polyps or small growths are found.  Hepatitis C blood testing is recommended for all people born from [birth year] through 1966 and any individual with known risks for hepatitis C.  Practice safe sex. Use condoms and avoid high-risk sexual practices to reduce the spread of sexually transmitted infections (STIs). Sexually active  women aged 67 and younger should be checked for Chlamydia, which is a common sexually transmitted infection. Older women with new or multiple partners should also be tested for Chlamydia. Testing for other STIs is recommended if you are sexually active and at increased risk.  Osteoporosis is a disease in which the bones lose minerals and strength with aging. This can result in serious bone fractures. The risk of osteoporosis can be identified using a bone density scan. Women ages 66 and over and women at risk for fractures or osteoporosis should discuss screening with their caregivers. Ask your caregiver whether you should be taking a calcium supplement or vitamin D to reduce the rate of osteoporosis.  Menopause can be associated with physical symptoms and risks. Hormone replacement therapy is available to decrease symptoms and risks. You should talk to your caregiver about whether hormone replacement therapy is right for you.  Use sunscreen with a sun protection factor (SPF) of 30 or greater. Apply sunscreen  liberally and repeatedly throughout the day. You should seek shade when your shadow is shorter than you. Protect yourself by wearing long sleeves, pants, a wide-brimmed hat, and sunglasses year round, whenever you are outdoors.  Notify your caregiver of new moles or changes in moles, especially if there is a change in shape or color. Also notify your caregiver if a mole is larger than the size of a pencil eraser.  Stay current with your immunizations. Document Released: 05/15/2011 Document Revised: 01/22/2012 Document Reviewed: 05/15/2011 [facility] Patient Information 2014 [place], [place].

## 2013-05-09 ENCOUNTER — Other Ambulatory Visit: Payer: Self-pay | Admitting: Family Medicine

## 2013-05-09 MED ORDER — FLUCONAZOLE 150 MG PO TABS: 150.0000 mg | ORAL_TABLET | Freq: Once | ORAL | Status: DC

## 2013-05-19 ENCOUNTER — Ambulatory Visit (INDEPENDENT_AMBULATORY_CARE_PROVIDER_SITE_OTHER): Payer: Self-pay | Admitting: Family Medicine

## 2013-05-19 VITALS — BP 132/76 | HR 82 | Temp 99.3°F | Ht 65.0 in | Wt 171.0 lb

## 2013-05-19 DIAGNOSIS — R21 Rash and other nonspecific skin eruption: Secondary | ICD-10-CM | POA: Insufficient documentation

## 2013-05-19 DIAGNOSIS — H60391 Other infective otitis externa, right ear: Secondary | ICD-10-CM

## 2013-05-19 DIAGNOSIS — J069 Acute upper respiratory infection, unspecified: Secondary | ICD-10-CM

## 2013-05-19 DIAGNOSIS — H6091 Unspecified otitis externa, right ear: Secondary | ICD-10-CM

## 2013-05-19 DIAGNOSIS — H60399 Other infective otitis externa, unspecified ear: Secondary | ICD-10-CM

## 2013-05-19 MED ORDER — CIPROFLOXACIN-DEXAMETHASONE 0.3-0.1 % OT SUSP: 4.0000 [drp] | Freq: Two times a day (BID) | OTIC | Status: DC

## 2013-05-19 MED ORDER — BENZONATATE 100 MG PO CAPS: 200.0000 mg | ORAL_CAPSULE | Freq: Three times a day (TID) | ORAL | Status: DC | PRN

## 2013-05-19 MED ORDER — GUAIFENESIN 200 MG PO TABS: 400.0000 mg | ORAL_TABLET | ORAL | Status: DC | PRN

## 2013-05-19 NOTE — Assessment & Plan Note (Signed)
Signs of local infection, most likely secondary to manipulation. P/ Topical abx for 7 days F/u as needed Discussed signs of worsening condition that should prompt re-evaluation.

## 2013-05-19 NOTE — Assessment & Plan Note (Signed)
No fever, chills or toxic appearance. Non- productive cough P/ Symptomatic treatment with guaifenesin and tessalon pearls. Use Albuterol Q4-6 PRN. Low threshold for treating COPD exacerbation if pt does not improve.  F/u as needed

## 2013-05-19 NOTE — Assessment & Plan Note (Signed)
No evident lesion seen. Most likely pruritus from dry skin.  P/ Mosturizer Keep good hydration

## 2013-05-19 NOTE — Patient Instructions (Addendum)
Otitis Externa Otitis externa is a bacterial or fungal infection of the outer ear canal. This is the area from the eardrum to the outside of the ear. Otitis externa is sometimes called "swimmer's ear." CAUSES  Possible causes of infection include:  Swimming in dirty water.  Moisture remaining in the ear after swimming or bathing.  Mild injury (trauma) to the ear.  Objects stuck in the ear (foreign body).  Cuts or scrapes (abrasions) on the outside of the ear. SYMPTOMS  The first symptom of infection is often itching in the ear canal. Later signs and symptoms may include swelling and redness of the ear canal, ear pain, and yellowish-white fluid (pus) coming from the ear. The ear pain may be worse when pulling on the earlobe. DIAGNOSIS  Your caregiver will perform a physical exam. A sample of fluid may be taken from the ear and examined for bacteria or fungi. TREATMENT  Antibiotic ear drops are often given for 10 to 14 days. Treatment may also include pain medicine or corticosteroids to reduce itching and swelling. PREVENTION   Keep your ear dry. Use the corner of a towel to absorb water out of the ear canal after swimming or bathing.  Avoid scratching or putting objects inside your ear. This can damage the ear canal or remove the protective wax that lines the canal. This makes it easier for bacteria and fungi to grow.  Avoid swimming in lakes, polluted water, or poorly chlorinated pools.  You may use ear drops made of rubbing alcohol and vinegar after swimming. Combine equal parts of white vinegar and alcohol in a bottle. Put 3 or 4 drops into each ear after swimming. HOME CARE INSTRUCTIONS   Apply antibiotic ear drops to the ear canal as prescribed by your caregiver.  Only take over-the-counter or prescription medicines for pain, discomfort, or fever as directed by your caregiver.  If you have diabetes, follow any additional treatment instructions from your caregiver.  Keep all  follow-up appointments as directed by your caregiver. SEEK MEDICAL CARE IF:   You have a fever.  Your ear is still red, swollen, painful, or draining pus after 3 days.  Your redness, swelling, or pain gets worse.  You have a severe headache.  You have redness, swelling, pain, or tenderness in the area behind your ear. MAKE SURE YOU:   Understand these instructions.  Will watch your condition.  Will get help right away if you are not doing well or get worse. Document Released: 10/30/2005 Document Revised: 01/22/2012 Document Reviewed: 11/16/2011 [employer] Patient Information 2014 [employer].  

## 2013-05-19 NOTE — Progress Notes (Signed)
Family Medicine Office Visit Note   Subjective:   Patient ID: [name], female  DOB: 05/17/1961, 52 y.o.. MRN: [id number]   Pt that comes today for same-day appointment complaining of right ear pain for about 2 weeks. She reports started with URI symptoms and felt congested, she has been also feeling sensation of fullness of her right ear and has tried to "clean it". Denies ear discharge, fever ,chills, nausea, or vomiting.   She has PMHx of Sarcoidosis and reports her respiratory symptoms are nasal congestion that has progressed to  Dry no--productive cough and since yesterday she has noticed mild wheezing that has responded to Albuterol at home.  She also reports abdominal itching started yesterday. No other areas of pruritus, no skin rashes noted per pt.   Review of Systems:  Pt denies SOB, chest pain, palpitations, headaches, dizziness, numbness or weakness.  Objective:   Physical Exam: Gen:  NAD HEENT: Moist mucous membranes. Right ear canal  erythematous with yellowish debris present. TM is intact. No signs of effusion or bulging. Left ear normal   CV: Regular rate and rhythm, no murmurs rubs or gallops PULM: Clear to auscultation bilaterally. Mils scattered wheezes, no rales. ABD: Soft, non tender, non distended, normal bowel sounds EXT: No edema Neuro: Alert and oriented x3. No focalization Skin: signs of dry skin (scaly patches) generalized but more evident on abdomen and extremities. No erythema. Pt has stretch marks present in abdomen with same characteristics described above.  Assessment & Plan:

## 2013-05-22 ENCOUNTER — Telehealth: Payer: Self-pay | Admitting: Family Medicine

## 2013-05-22 NOTE — Telephone Encounter (Signed)
Patient is calling because the antibiotic was too expensive and she would like something more affordable called in for her.  Also the pharmacy did not receive the Rx for Guaifenesin.

## 2013-05-22 NOTE — Telephone Encounter (Signed)
I believe this message was for Dr. Paulina Fusi, not Leveda Anna

## 2013-05-23 NOTE — Telephone Encounter (Signed)
Hey Day,  Think you saw this patient on 7/7.  Thanks, Twana First. Amil Moseman, DO of [facility] 05/23/2013, 1:40 PM

## 2013-05-29 ENCOUNTER — Encounter: Payer: Self-pay | Admitting: Internal Medicine

## 2013-05-29 ENCOUNTER — Ambulatory Visit (INDEPENDENT_AMBULATORY_CARE_PROVIDER_SITE_OTHER): Payer: BC Managed Care – PPO | Admitting: Internal Medicine

## 2013-05-29 VITALS — BP 140/80 | HR 78 | Temp 98.4°F | Ht 64.5 in | Wt 174.2 lb

## 2013-05-29 DIAGNOSIS — J069 Acute upper respiratory infection, unspecified: Secondary | ICD-10-CM

## 2013-05-29 DIAGNOSIS — D869 Sarcoidosis, unspecified: Secondary | ICD-10-CM

## 2013-05-29 MED ORDER — FLUCONAZOLE 150 MG PO TABS: 150.0000 mg | ORAL_TABLET | Freq: Every day | ORAL | Status: DC

## 2013-05-29 MED ORDER — PREDNISONE (PAK) 10 MG PO TABS: ORAL_TABLET | ORAL | Status: DC

## 2013-05-29 MED ORDER — TRAMADOL HCL 50 MG PO TABS: ORAL_TABLET | ORAL | Status: DC

## 2013-05-29 MED ORDER — AMOXICILLIN-POT CLAVULANATE 875-125 MG PO TABS: 1.0000 | ORAL_TABLET | Freq: Two times a day (BID) | ORAL | Status: DC

## 2013-05-29 NOTE — Patient Instructions (Addendum)
Whenever you start having problems with your breathing/coughing/wheezing/sinus/ ears/ chest pain go ahead and increase the prilosec to Take 30- 60 min before your first and last meals of the day   [email or web address] is your source for generic medications, cash only though(no insurance)  Prednisone 10 mg take  4 each am x 2 days,   2 each am x 2 days,  1 each am x2days and stop   For cough use mucinex  Or mucinex dm 1200 mg every 12 hours and supplement with ultram / tramadol 50 mg every 4 hours  augmentin 857 mg twice daily x 10 days  I emphasized that nasal steroids have no immediate benefit in terms of improving symptoms.  To help them reached the target tissue, the patient should use Afrin two puffs every 12 hours applied one min before using the nasal steroids.  Afrin should be stopped after no more than 5 days.  If the symptoms worsen, Afrin can be restarted after 5 days off of therapy to prevent rebound congestion from overuse of Afrin.  I also emphasized that in no way are nasal steroids a concern in terms of "addiction".    See Tammy NP w/in 2 weeks with all your medications, even over the counter meds, separated in two separate bags, the ones you take no matter what vs the ones you stop once you feel better and take only as needed when you feel you need them.   Tammy  will generate for you a new user friendly medication calendar that will put [place] all on the same page re: your medication use.     Without this process, it simply isn't possible to assure that we are providing  your outpatient care  with  the attention to detail we feel you deserve.   If we cannot assure that you're getting that kind of care,  then we cannot manage your problem effectively from this clinic.  Once you have seen Tammy and we are sure that we're all on the same page with your medication use she will arrange follow up with me.

## 2013-05-29 NOTE — Progress Notes (Addendum)
Subjective:     Patient ID: [name], female   DOB: [birth year]    MRN: [id number]  Brief patient profile:  52 yobf quit smoking 10/2004 dx by transbronchial biopsy 5/92 with NCG inflammatory change consistent with sarcoid. Since that time she's been off and on prednisone multiple  times with symptoms of coughing dyspnea and skin involvement > weaned off chronic prednisone Feb 2012.   Overall she's been doing much better since she stopped smoking 10/2004 and much better since change to Symbicort 160/4.5 2 two times a day, able to do adl's ok without limitations including some steps.   Stopped prednisone even the qod late Feb 2012 > no flare of symptoms or nausea/fatigue so remained off   08/24/2011 Acute OV  PT complains of wheezing x 2 days.  sob with activity x 2 days--no cough but some sneezing. Some drainage at times. No fever or discolored mucus. Does not take symbicort on regular basis.  No otc used. No chest pain or edema.   rec Remain on Symbicort regularly everyday w/ no missed doses.  Prednisone taper over next week.  Please contact office for sooner follow up if symptoms do not improve or worsen or seek emergency care  follow up Dr. [name]  In 6 weeks> did not make appt   08/13/2012 f/u ov/Sayed Apostol cc missed appt  08/30/2012 f/u ov/Iseah Plouff cc better since last ov until 2 days prior to OV  With increase wheeze and cough and not using ppi qd ac. rec Whenever you start having problems with your breathing/coughing/wheezing go ahead and increase the prilosec to Take 30- 60 min before your first and last meals of the day  Prednisone 10 mg take  4 each am x 2 days,   2 each am x 2 days,  1 each am x2days and stop  For cough use mucinex    11/28/2012 f/u ov/Orva Riles cc Worse sob  x 3 days with wheezing and cough-occ prod with yellow sputum assoc with nasal congestion off flonase cause can't afford it, filing for disability.  rec Whenever you start having problems with your  breathing/coughing/wheezing go ahead and increase the prilosec to Take 30- 60 min before your first and last meals of the day  Prednisone 10 mg take  4 each am x 2 days,   2 each am x 2 days,  1 each am x2days and stop  For cough use mucinex  Or mucinex dm Please see patient coordinator before you leave today for [employer] assistance for Symbicort 160   01/29/2013 f/u ov/Verlene Glantz cc nasal congestion much better p prednisone and then worse off it with cp > neg cardiac w/u, did not activate the action plan prev outlined rec  Whenever you start having problems with your breathing/coughing/wheezing/sinus/ ears/ chest pain go ahead and increase the prilosec to Take 30- 60 min before your first and last meals of the day  [email or web address] is your source for generic medications, cash only though(no insurance) Prednisone 10 mg take  4 each am x 2 days,   2 each am x 2 days,  1 each am x2days and stop  For cough use mucinex  Or mucinex dm I emphasized that nasal steroids have no immediate benefit in terms of improving symptoms.  To help them reached the target tissue, the patient should use Afrin two puffs every 12 hours applied one min before using the nasal steroids.  Afrin should be stopped after no more than 5 days.  If  the symptoms worsen, Afrin can be restarted after 5 days off of therapy to prevent rebound congestion from overuse of Afrin.  I also emphasized that in no way are nasal steroids a concern in terms of "addiction".    05/29/2013 f/u ov/Kadarious Dikes re flare of resp symptoms, again did not active action plan Chief Complaint  Patient presents with  . Acute Visit    Pt c/o prod cough with yellow to clear sputum, increased SOB, wheezing and bilateral ear pain for approx 2 wks.     Sob only with exertion, responsive to saba with increased use over baseline including noct .  Symptoms were acute with good chronic control of symptoms on symbicort 160 2bid  No obvious daytime variabilty or assoc cp or  chest tightness,   overt   hb symptoms. No unusual exp hx. Some better p saba   . Also denies any obvious fluctuation of symptoms with weather or environmental changes or other aggravating or alleviating factors except as outlined above   ROS  The following are not active complaints unless bolded sore throat, dysphagia, dental problems, itching, sneezing,  nasal congestion worse off flonase or excess/ purulent secretions, ear ache,   fever, chills, sweats, unintended wt loss, pleuritic or exertional cp, hemoptysis,  orthopnea pnd or leg swelling, presyncope, palpitations, heartburn, abdominal pain, anorexia, nausea, vomiting, diarrhea  or change in bowel or urinary habits, change in stools or urine, dysuria,hematuria,  rash, arthralgias, visual complaints, headache, numbness weakness or ataxia or problems with walking or coordination,  change in mood/affect or memory.            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 Marga Melnick  -h/o Daily prednisone since 2007   > off completely Feb 2012 with no problem ? 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 (ICD-280.9)  Health Maintenance.......................................................   [facility]   Family History:  crohn`s in daughter, M-lupus, htn, asthma,  no Ca, DM, CAD   Social  History:   lives with twin children and grandson. single; >20 pack year history, quit in 2005; no EtOH  Laid off  from works for [employer] Sept 2013              Objective:   Physical Exam wt 166 Oct 12, 2009 >170 October 28, 2010 > 169 06/29/2011 >>171 08/24/2011 > 01/15/2012  171 > 04/24/2012 176 > 06/28/2012  175> 08/13/2012 > 08/30/2012  173 > 170 11/28/2012 > 01/29/2013  176 > 174  amb bf nad  HEENT mild turbinate edema. Oropharynx clear  but no excess pnd or cobblestoning. No JVD or cervical adenopathy. Mild accessory muscle hypertrophy. Trachea midline, nl thryroid. Lungs good air movement no wheeze.  Regular rate and rhythm without murmur gallop or rub or increase P2 or edema. Abd: no hsm, nl excursion. Ext warm without cyanosis or clubbing.  CXR  12/19/12  No evidence of acute cardiopulmonary disease.  Stable chronic interstitial markings/scarring.        Assessment:

## 2013-05-31 NOTE — Assessment & Plan Note (Signed)
Explained natural history of uri and why it's necessary in patients at risk to treat GERD aggressively  at least  short term   to reduce risk of evolving cyclical cough initially  triggered by epithelial injury and a heightened sensitivty to the effects of any upper airway irritants,  most importantly acid - related.  That is, the more sensitive the epithelium damaged for virus, the more the cough, the more the secondary reflux (especially in those prone to reflux) the more the irritation of the sensitive mucosa and so on in a cyclical pattern.  She continues to consistently  fail to use the action plans we've already been over multiples times and struggling with the concept of medication reconciliation.  To keep things simple, I have asked the patient to first separate medicines that are perceived as maintenance, that is to be taken daily "no matter what", from those medicines that are taken on only on an as-needed basis and I have given the patient examples of both, and then return to see our NP to generate a  detailed  medication calendar which should be followed until the next physician sees the patient and updates it.    See instructions for specific recommendations which were reviewed directly with the patient who was given a copy with highlighter outlining the key components.

## 2013-05-31 NOTE — Assessment & Plan Note (Addendum)
Positive transbronchial biopsy 02/19/91 by Dr. Marga Melnick  -Daily prednisone since 2007 with ? adrenal insufficiency iatrogenic > off completely Feb 2012 with no problem - PFT's 06/26/08 FEV1  1.09 ratio  - PFTs  03/02/10 FEV1  1.06 (42%) ratio 34 and no better p B2,  DLC0 48% corrects to 64% - PFT's 08/01/2011 FEV1 1.6 (42%)  41 ratio  DLCO 55 corrects 77 - HFA  90% 04/24/2012      No evidence of active dz off chronic steroids x 2 years > no change rx x ok to use pred short term to control rhinitis flare

## 2013-06-16 ENCOUNTER — Encounter: Payer: Self-pay | Admitting: Adult Health

## 2013-06-16 ENCOUNTER — Ambulatory Visit (INDEPENDENT_AMBULATORY_CARE_PROVIDER_SITE_OTHER): Payer: BC Managed Care – PPO | Admitting: Adult Health

## 2013-06-16 VITALS — BP 124/84 | HR 70 | Temp 98.3°F | Ht 64.0 in | Wt 173.0 lb

## 2013-06-16 DIAGNOSIS — J449 Chronic obstructive pulmonary disease, unspecified: Secondary | ICD-10-CM

## 2013-06-16 NOTE — Progress Notes (Signed)
Subjective:     Patient ID: [name], female   DOB: [birth year]    MRN: [id number]  Brief patient profile:  52 yobf quit smoking 10/2004 dx by transbronchial biopsy 5/92 with NCG inflammatory change consistent with sarcoid. Since that time she's been off and on prednisone multiple  times with symptoms of coughing dyspnea and skin involvement > weaned off chronic prednisone Feb 2012.   Overall she's been doing much better since she stopped smoking 10/2004 and much better since change to Symbicort 160/4.5 2 two times a day, able to do adl's ok without limitations including some steps.   Stopped prednisone even the qod late Feb 2012 > no flare of symptoms or nausea/fatigue so remained off   08/24/2011 Acute OV  PT complains of wheezing x 2 days.  sob with activity x 2 days--no cough but some sneezing. Some drainage at times. No fever or discolored mucus. Does not take symbicort on regular basis.  No otc used. No chest pain or edema.   rec Remain on Symbicort regularly everyday w/ no missed doses.  Prednisone taper over next week.  Please contact office for sooner follow up if symptoms do not improve or worsen or seek emergency care  follow up Dr. [name]  In 6 weeks> did not make appt   08/13/2012 f/u ov/Wert cc missed appt  08/30/2012 f/u ov/Wert cc better since last ov until 2 days prior to OV  With increase wheeze and cough and not using ppi qd ac. rec Whenever you start having problems with your breathing/coughing/wheezing go ahead and increase the prilosec to Take 30- 60 min before your first and last meals of the day  Prednisone 10 mg take  4 each am x 2 days,   2 each am x 2 days,  1 each am x2days and stop  For cough use mucinex    11/28/2012 f/u ov/Wert cc Worse sob  x 3 days with wheezing and cough-occ prod with yellow sputum assoc with nasal congestion off flonase cause can't afford it, filing for disability.  rec Whenever you start having problems with your  breathing/coughing/wheezing go ahead and increase the prilosec to Take 30- 60 min before your first and last meals of the day  Prednisone 10 mg take  4 each am x 2 days,   2 each am x 2 days,  1 each am x2days and stop  For cough use mucinex  Or mucinex dm Please see patient coordinator before you leave today for [employer] assistance for Symbicort 160   01/29/2013 f/u ov/Wert cc nasal congestion much better p prednisone and then worse off it with cp > neg cardiac w/u, did not activate the action plan prev outlined rec  Whenever you start having problems with your breathing/coughing/wheezing/sinus/ ears/ chest pain go ahead and increase the prilosec to Take 30- 60 min before your first and last meals of the day  [email or web address] is your source for generic medications, cash only though(no insurance) Prednisone 10 mg take  4 each am x 2 days,   2 each am x 2 days,  1 each am x2days and stop  For cough use mucinex  Or mucinex dm I emphasized that nasal steroids have no immediate benefit in terms of improving symptoms.  To help them reached the target tissue, the patient should use Afrin two puffs every 12 hours applied one min before using the nasal steroids.  Afrin should be stopped after no more than 5 days.  If  the symptoms worsen, Afrin can be restarted after 5 days off of therapy to prevent rebound congestion from overuse of Afrin.  I also emphasized that in no way are nasal steroids a concern in terms of "addiction".    05/29/2013 f/u ov/Wert re flare of resp symptoms, again did not active action plan Chief Complaint  Patient presents with  . Acute Visit    Pt c/o prod cough with yellow to clear sputum, increased SOB, wheezing and bilateral ear pain for approx 2 wks.     Sob only with exertion, responsive to saba with increased use over baseline including noct .  Symptoms were acute with good chronic control of symptoms on symbicort 160 2bid >>augmentin and steroid taper   06/16/2013  Follow and med review  Patient returns for a two-week followup and medication review. Reviewed all her medications organized them into a medication calendar with patient education. Patient was given a [employer] payment card to to help with co -payments for her symbicort inhaler.  Patient had a COPD exacerbation. Last visit. Was given Augmentin and a steroid taper. Patient reports that she is feeling much improved with decreased cough, congestion, and wheezing. She denies any hemoptysis, orthopnea, PND, or leg swelling  Med Cal-- pt reports breathing is doing well since last visit, infection sx are gone-- denies any other concerns at this time   ROS  The following are not active complaints unless bolded sore throat, dysphagia, dental problems, itching, sneezing,    or excess/ purulent secretions, ear ache,   fever, chills, sweats, unintended wt loss, pleuritic or exertional cp, hemoptysis,  orthopnea pnd or leg swelling, presyncope, palpitations, heartburn, abdominal pain, anorexia, nausea, vomiting, diarrhea  or change in bowel or urinary habits, change in stools or urine, dysuria,hematuria,  rash, arthralgias, visual complaints, headache, numbness weakness or ataxia or problems with walking or coordination,  change in mood/affect or memory.            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 Marga Melnick  -h/o Daily prednisone since 2007   > off completely Feb 2012 with no problem ? 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 (ICD-280.9)  Health Maintenance.......................................................   [facility]   Family History:  crohn`s in daughter, M-lupus, htn, asthma,  no Ca, DM, CAD   Social History:   lives with twin children and grandson. single; >20 pack year history, quit in 2005; no EtOH  Laid off  from works for [employer] Sept 2013              Objective:   Physical Exam wt 166 Oct 12, 2009 >170 October 28, 2010 > 169 06/29/2011 >>171 08/24/2011 > 01/15/2012  171 > 04/24/2012 176 > 06/28/2012  175> 08/13/2012 > 08/30/2012  173 > 170 11/28/2012 > 01/29/2013  176 > 174 >173 06/16/2013  amb bf nad  HEENT  mild turbinate edema. Oropharynx clear  but no excess pnd or cobblestoning. No JVD or cervical adenopathy. Mild accessory muscle hypertrophy. Trachea midline, nl thryroid. Lungs good air movement no wheeze.  Regular rate and rhythm without murmur gallop or rub or increase P2 or edema. Abd: no hsm, nl excursion. Ext warm without cyanosis or clubbing.     CXR  12/19/12  No evidence of acute cardiopulmonary disease.  Stable chronic interstitial markings/scarring.        Assessment:

## 2013-06-16 NOTE — Patient Instructions (Addendum)
Follow medication calendar closely and bring to each visit. Follow up Dr. [name] in 3 months and as needed

## 2013-06-16 NOTE — Assessment & Plan Note (Signed)
Recent exacerbation, now resolved Patient's medications were reviewed today and patient education was given. Computerized medication calendar was adjusted/completed  Plan  Patient continue on current regimen and follow back up Dr. [name] in 3 months and as needed

## 2013-07-01 NOTE — Addendum Note (Signed)
Addended by: Boone Master E on: 07/01/2013 05:19 PM   Modules accepted: Orders

## 2013-07-02 ENCOUNTER — Telehealth: Payer: Self-pay | Admitting: Internal Medicine

## 2013-07-02 NOTE — Telephone Encounter (Signed)
ATC patient, no answer LMOMTCB 

## 2013-07-03 MED ORDER — BUDESONIDE-FORMOTEROL FUMARATE 160-4.5 MCG/ACT IN AERO: 2.0000 | INHALATION_SPRAY | Freq: Two times a day (BID) | RESPIRATORY_TRACT | Status: DC

## 2013-07-03 NOTE — Telephone Encounter (Signed)
LMTCBx2 to see what inhaler the pt is needing. Carron Curie, CMA

## 2013-07-03 NOTE — Telephone Encounter (Signed)
Pt is needing sample of symbicort. I have left this at front and pt is aware. Carron Curie, CMA

## 2013-07-11 ENCOUNTER — Telehealth: Payer: Self-pay | Admitting: Family Medicine

## 2013-07-11 MED ORDER — HYDROCHLOROTHIAZIDE 12.5 MG PO TABS: 12.5000 mg | ORAL_TABLET | Freq: Every day | ORAL | Status: DC

## 2013-07-11 NOTE — Telephone Encounter (Signed)
Spoke with [employer].  Patient has 6 refills on HCTZ and Rx is ready to pick up.  Patient informed.  Gaylene Brooks, RN

## 2013-07-11 NOTE — Telephone Encounter (Signed)
[facility] at [address] has faxed over refill request for HCTZ for this patient and she is out of her meds and needs this refilled before the holiday weekend.

## 2013-07-15 ENCOUNTER — Other Ambulatory Visit: Payer: Self-pay | Admitting: *Deleted

## 2013-07-15 MED ORDER — METOPROLOL TARTRATE 25 MG PO TABS: 25.0000 mg | ORAL_TABLET | Freq: Two times a day (BID) | ORAL | Status: DC

## 2013-07-30 ENCOUNTER — Encounter: Payer: Self-pay | Admitting: Family Medicine

## 2013-07-30 ENCOUNTER — Ambulatory Visit (INDEPENDENT_AMBULATORY_CARE_PROVIDER_SITE_OTHER): Payer: BC Managed Care – PPO | Admitting: Family Medicine

## 2013-07-30 VITALS — BP 136/69 | HR 66 | Temp 98.7°F | Ht 65.0 in | Wt 171.0 lb

## 2013-07-30 DIAGNOSIS — I10 Essential (primary) hypertension: Secondary | ICD-10-CM

## 2013-07-30 DIAGNOSIS — Z23 Encounter for immunization: Secondary | ICD-10-CM

## 2013-07-30 NOTE — Progress Notes (Signed)
[name] is a 52 y.o. female who presents today for HTN.    Pt BP well controlled today at 136/69.  Currently compliant taking metoprolol and HCTZ w/o palpitations, edema, fatigue.    Past Medical History  Diagnosis Date  . Hypertension   . Sarcoidosis STABLE PER CXR JUNE 2013  . Long-term current use of steroids SYMBICORT INHALER  . Allergic rhinitis   . HTN (hypertension)   . Iron deficiency anemia   . COPD, mild FOLLOWED BY DR [name]  . Sickle cell trait   . 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 Shortness of breath     History  Smoking status  . Former Smoker -- 1.00 packs/day for 20 years  . Quit date: 11/14/2003  Smokeless tobacco  . Never Used    Family History  Problem Relation Age of Onset  . Hyperlipidemia Mother   . Asthma Mother   . Lupus Mother   . Hypertension Father   . Hyperlipidemia Father   . Hypertension Sister   . Hypertension Brother   . Cancer Neg Hx   . Diabetes Neg Hx   . Coronary artery disease Neg Hx     Current Outpatient Prescriptions on File Prior to Visit  Medication Sig Dispense Refill  . albuterol (PROVENTIL HFA;VENTOLIN HFA) 108 (90 BASE) MCG/ACT inhaler Inhale 2 puffs into the lungs every 4 (four) hours as needed. For shortness of breath or wheezing  1 Inhaler  5  . aspirin EC 81 MG EC tablet Take 1 tablet (81 mg total) by mouth daily.  30 tablet  11  . budesonide-formoterol (SYMBICORT) 160-4.5 MCG/ACT inhaler Inhale 2 puffs into the lungs 2 (two) times daily. 2 puffs each am and 2 each pm  1 Inhaler  0  . Calcium Carb-Cholecalciferol (CALCIUM-VITAMIN D) 600-400 MG-UNIT TABS Take 1 tablet by mouth every morning.      . ferrous sulfate 325 (65 FE) MG tablet Take 325 mg by mouth every other day.      . hydrochlorothiazide (HYDRODIURIL) 12.5 MG tablet Take 1 tablet (12.5 mg total) by mouth daily.  30 tablet  0  . metoprolol tartrate (LOPRESSOR) 25 MG tablet Take 1 tablet (25 mg total) by mouth 2 (two) times daily.  60 tablet  11  . mometasone  (NASONEX) 50 MCG/ACT nasal spray Place 2 sprays into the nose at bedtime as needed.      Marland Kitchen omeprazole (PRILOSEC) 20 MG capsule Take 30- 60 min before your first and last meals of the day  180 capsule  3  . traMADol (ULTRAM) 50 MG tablet 1-2 every 4 hours as needed for cough or pain  40 tablet  0   No current facility-administered medications on file prior to visit.    ROS: Per HPI.  All other systems reviewed and are negative.   Physical Exam Filed Vitals:   07/30/13 1428  BP: 136/69  Pulse: 66  Temp: 98.7 F (37.1 C)    Physical Examination: General appearance - alert, well appearing, and in no distress Chest - clear to auscultation, no wheezes, rales or rhonchi, symmetric air entry Heart - normal rate and regular rhythm, no murmurs noted

## 2013-07-30 NOTE — Assessment & Plan Note (Signed)
Continues to be at goal, continue on metoprolol and hydrodiuril.  Will see back in 6-12 months.  At that time consider BMET, Lipid Profile, and appropriate age CA screening.

## 2013-07-30 NOTE — Patient Instructions (Signed)
Ms. [name], it was nice seeing you today.  We will see you back as needed or in 6-12 months.  Continue to follow along with your pulmonologist.  Thank you, Dr. Paulina Fusi

## 2013-08-04 ENCOUNTER — Telehealth: Payer: Self-pay | Admitting: Internal Medicine

## 2013-08-04 MED ORDER — BUDESONIDE-FORMOTEROL FUMARATE 160-4.5 MCG/ACT IN AERO: 2.0000 | INHALATION_SPRAY | Freq: Two times a day (BID) | RESPIRATORY_TRACT | Status: DC

## 2013-08-04 NOTE — Telephone Encounter (Signed)
Pt aware 1 sample left upfront for pick up. Nothing further needed 

## 2013-08-26 ENCOUNTER — Telehealth: Payer: Self-pay | Admitting: Internal Medicine

## 2013-08-26 NOTE — Telephone Encounter (Signed)
Spoke w/ pt stated she needed sample of symbicort 160 Advised pt would leave sample at front desk for her to pick up

## 2013-09-10 ENCOUNTER — Ambulatory Visit (INDEPENDENT_AMBULATORY_CARE_PROVIDER_SITE_OTHER): Payer: Self-pay | Admitting: Family Medicine

## 2013-09-10 ENCOUNTER — Encounter: Payer: Self-pay | Admitting: Family Medicine

## 2013-09-10 VITALS — BP 159/89 | HR 75 | Temp 99.4°F | Ht 65.0 in | Wt 162.0 lb

## 2013-09-10 DIAGNOSIS — I10 Essential (primary) hypertension: Secondary | ICD-10-CM

## 2013-09-10 DIAGNOSIS — H538 Other visual disturbances: Secondary | ICD-10-CM | POA: Insufficient documentation

## 2013-09-10 LAB — BASIC METABOLIC PANEL
Chloride: 99 mEq/L (ref 96–112)
Potassium: 3.7 mEq/L (ref 3.5–5.3)

## 2013-09-10 LAB — POCT GLYCOSYLATED HEMOGLOBIN (HGB A1C): Hemoglobin A1C: 5.9

## 2013-09-10 NOTE — Patient Instructions (Signed)
[name], it was nice seeing you today.  We will get a few labs on you today to see if this is caused by high sugars or differences in your potassium or sodium.  As well, if this continues or gets worse, please call your eye doctor immediately and if you can not get in touch with him, please go to the emergency room.  We will see you back in 3-4 months or sooner if needed.  Thanks, Dr. Paulina Fusi

## 2013-09-10 NOTE — Assessment & Plan Note (Addendum)
Pt BP on repeat 142/70 today, which I doubt would be contributing to her blurred vision.  Continue currently on HCTZ and Metoprolol, will continue at current dose for now.

## 2013-09-10 NOTE — Assessment & Plan Note (Addendum)
Pt with blurred vision for the past 2 weeks, constant while awake, that is unchanged when has glasses on vs not on.  She had seen an ophthalmologist about two weeks ago, Dr. Aura Camps in [facility], who preformed a full exam on her including dilation with retinal and macular evaluation, glaucoma testing, and thorough evaluation which was all negative.  Her EOMI are intact today with no nystagmus, PERRLA B/L, no visible cataracts, no macular edema/retinal/venous occlusions present.  She does have sarcoidosis which could be manifesting as this as well, which would need ophtho evaluation again.  As well, denies any Amaurosis fugax concerning for ischemia, retinal artery occlusion/vein occlusion.  Her visual acuity today is 20/20 R eye, 20/50 L eye (normal for her), and 20/20 both eyes.  Lastly, will get A1C today to evaluate for possible diabetes causing her blurred vision and BMET for electrolyte disturbance, and Recommend visine and if continues to follow up with her eye doctor again, and to send myself the transactions.  Will f/u in the next three months, precepted the case with Dr. Renold Don.

## 2013-09-10 NOTE — Progress Notes (Signed)
[name] is a 52 y.o. female who presents today for blurred vision and HTN  HTN - Repeat at 142/70 today, compliant with medication.  Denies any palpitations, chest pain, edema, fatigue.    Blurred Vision - Pt with blurred vision for the past 2 weeks, constant while awake, that is unchanged when has glasses on vs not on.  She had seen an ophthalmologist about two weeks ago, Dr. Aura Camps in [facility], who preformed a full exam on her including dilation with retinal and macular evaluation, glaucoma testing, and thorough evaluation which was all negative.  Her EOMI are intact today with no nystagmus, PERRLA B/L, no visible cataracts, no macular edema/retinal/venous occlusions present.  She does have sarcoidosis which could be manifesting as this as well, which would need ophtho evaluation again.  As well, denies any Amaurosis fugax concerning for ischemia, retinal artery occlusion/vein occlusion.  Her visual acuity today is 20/20 R eye, 20/50 L eye (normal for her), and 20/20 both eyes.  Past Medical History  Diagnosis Date  . Hypertension   . Sarcoidosis STABLE PER CXR JUNE 2013  . Long-term current use of steroids SYMBICORT INHALER  . Allergic rhinitis   . HTN (hypertension)   . Iron deficiency anemia   . COPD, mild FOLLOWED BY DR [name]  . Sickle cell trait   . 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 Shortness of breath     History  Smoking status  . Former Smoker -- 1.00 packs/day for 20 years  . Quit date: 11/14/2003  Smokeless tobacco  . Never Used    Family History  Problem Relation Age of Onset  . Hyperlipidemia Mother   . Asthma Mother   . Lupus Mother   . Hypertension Father   . Hyperlipidemia Father   . Hypertension Sister   . Hypertension Brother   . Cancer Neg Hx   . Diabetes Neg Hx   . Coronary artery disease Neg Hx     Current Outpatient Prescriptions on File Prior to Visit  Medication Sig Dispense Refill  . albuterol (PROVENTIL HFA;VENTOLIN HFA) 108 (90 BASE)  MCG/ACT inhaler Inhale 2 puffs into the lungs every 4 (four) hours as needed. For shortness of breath or wheezing  1 Inhaler  5  . aspirin EC 81 MG EC tablet Take 1 tablet (81 mg total) by mouth daily.  30 tablet  11  . budesonide-formoterol (SYMBICORT) 160-4.5 MCG/ACT inhaler Inhale 2 puffs into the lungs 2 (two) times daily. 2 puffs each am and 2 each pm  1 Inhaler  0  . Calcium Carb-Cholecalciferol (CALCIUM-VITAMIN D) 600-400 MG-UNIT TABS Take 1 tablet by mouth every morning.      . ferrous sulfate 325 (65 FE) MG tablet Take 325 mg by mouth every other day.      . hydrochlorothiazide (HYDRODIURIL) 12.5 MG tablet Take 1 tablet (12.5 mg total) by mouth daily.  30 tablet  0  . metoprolol tartrate (LOPRESSOR) 25 MG tablet Take 1 tablet (25 mg total) by mouth 2 (two) times daily.  60 tablet  11  . mometasone (NASONEX) 50 MCG/ACT nasal spray Place 2 sprays into the nose at bedtime as needed.      Marland Kitchen omeprazole (PRILOSEC) 20 MG capsule Take 30- 60 min before your first and last meals of the day  180 capsule  3  . traMADol (ULTRAM) 50 MG tablet 1-2 every 4 hours as needed for cough or pain  40 tablet  0   No current  facility-administered medications on file prior to visit.    ROS: Per HPI.  All other systems reviewed and are negative.   Physical Exam Filed Vitals:   09/10/13 1616  BP: 159/89  Pulse: 75  Temp: 99.4 F (37.4 C)    Physical Examination: General appearance - alert, well appearing, and in no distress Mental status - alert, oriented to person, place, and time Eyes - funduscopic exam normal, discs flat and sharp, no background diabetic retinopathy is noted, PERRLA B/L, EOMI B/L, Visual Acuity 20/20 R eye, 20/50 L eye (she states normal for her), and 20/20 both eyes  Ears - bilateral TM's and external ear canals normal Nose - normal and patent, no erythema, discharge or polyps Mouth - Nml Neck - carotids upstroke normal bilaterally, no bruits Chest - clear to auscultation, no  wheezes, rales or rhonchi, symmetric air entry Heart - normal rate and regular rhythm, no murmurs noted    Chemistry      Component Value Date/Time   NA 140 12/20/2012 0630   K 3.5 12/20/2012 0630   CL 103 12/20/2012 0630   CO2 30 12/20/2012 0630   BUN 14 12/20/2012 0630   CREATININE 0.73 12/20/2012 0630   CREATININE 0.63 07/19/2012 1136      Component Value Date/Time   CALCIUM 9.2 12/20/2012 0630   ALKPHOS 72 04/24/2011 1610   AST 15 04/24/2011 1610   ALT 8 04/24/2011 1610   BILITOT 0.3 04/24/2011 1610

## 2013-09-18 ENCOUNTER — Encounter: Payer: Self-pay | Admitting: Internal Medicine

## 2013-09-18 ENCOUNTER — Ambulatory Visit (INDEPENDENT_AMBULATORY_CARE_PROVIDER_SITE_OTHER): Payer: Self-pay | Admitting: Internal Medicine

## 2013-09-18 VITALS — BP 120/80 | HR 67 | Temp 98.5°F | Ht 64.0 in | Wt 171.0 lb

## 2013-09-18 DIAGNOSIS — K219 Gastro-esophageal reflux disease without esophagitis: Secondary | ICD-10-CM

## 2013-09-18 DIAGNOSIS — J449 Chronic obstructive pulmonary disease, unspecified: Secondary | ICD-10-CM

## 2013-09-18 DIAGNOSIS — D869 Sarcoidosis, unspecified: Secondary | ICD-10-CM

## 2013-09-18 NOTE — Progress Notes (Signed)
Subjective:     Patient ID: [name], female   DOB: 08/16/1961    MRN: [id number]  Brief patient profile:  52 yobf quit smoking 10/2004 dx by transbronchial biopsy 5/92 with NCG inflammatory change consistent with sarcoid. Since that time she's been off and on prednisone multiple  times with symptoms of coughing dyspnea and skin involvement > weaned off chronic prednisone Feb 2012.   Overall she's been doing much better since she stopped smoking 10/2004 and much better since change to Symbicort 160/4.5 2 two times a day, able to do adl's ok without limitations including some steps. Documented GOLD III copd  07/2011     History of Present Illness  08/24/2011 Acute OV  PT complains of wheezing x 2 days.  sob with activity x 2 days--no cough but some sneezing. Some drainage at times. No fever or discolored mucus. Does not take symbicort on regular basis.  No otc used. No chest pain or edema.   rec Remain on Symbicort regularly everyday w/ no missed doses.  Prednisone taper over next week.  Please contact office for sooner follow up if symptoms do not improve or worsen or seek emergency care  follow up Dr. [name]  In 6 weeks> did not make appt   08/13/2012 f/u ov/Wert cc missed appt  08/30/2012 f/u ov/Wert cc better since last ov until 2 days prior to OV  With increase wheeze and cough and not using ppi qd ac. rec Whenever you start having problems with your breathing/coughing/wheezing go ahead and increase the prilosec to Take 30- 60 min before your first and last meals of the day  Prednisone 10 mg take  4 each am x 2 days,   2 each am x 2 days,  1 each am x2days and stop  For cough use mucinex    11/28/2012 f/u ov/Wert cc Worse sob  x 3 days with wheezing and cough-occ prod with yellow sputum assoc with nasal congestion off flonase cause can't afford it, filing for disability.  rec Whenever you start having problems with your breathing/coughing/wheezing go ahead and increase the prilosec  to Take 30- 60 min before your first and last meals of the day  Prednisone 10 mg take  4 each am x 2 days,   2 each am x 2 days,  1 each am x2days and stop  For cough use mucinex  Or mucinex dm Please see patient coordinator before you leave today for [employer] assistance for Symbicort 160   01/29/2013 f/u ov/Wert cc nasal congestion much better p prednisone and then worse off it with cp > neg cardiac w/u, did not activate the action plan prev outlined rec  Whenever you start having problems with your breathing/coughing/wheezing/sinus/ ears/ chest pain go ahead and increase the prilosec to Take 30- 60 min before your first and last meals of the day  [email or web address] is your source for generic medications, cash only though(no insurance) Prednisone 10 mg take  4 each am x 2 days,   2 each am x 2 days,  1 each am x2days and stop  For cough use mucinex  Or mucinex dm I emphasized that nasal steroids have no immediate benefit     05/29/2013 f/u ov/Wert re flare of resp symptoms, again did not active action plan Chief Complaint  Patient presents with  . Acute Visit    Pt c/o prod cough with yellow to clear sputum, increased SOB, wheezing and bilateral ear pain for approx 2 wks.  Sob only with exertion, responsive to saba with increased use over baseline including noct .  Symptoms were acute with good chronic control of symptoms on symbicort 160 2bid >>augmentin and steroid taper   06/16/2013 Follow and med review  Patient returns for a two-week followup and medication review. Reviewed all her medications organized them into a medication calendar with patient education. Patient was given a Co payment card to to help with co -payments for her symbicort inhaler.  Patient had a COPD exacerbation. Last visit. Was given Augmentin and a steroid taper. Patient reports that she is feeling much improved with decreased cough, congestion, and wheezing. She denies any hemoptysis, orthopnea, PND, or leg  swelling  Med Cal-- pt reports breathing is doing well since last visit, infection sx are gone-- denies any other concerns at this time Rec Follow medication calendar closely and bring to each visit.   09/18/2013  Acute  ov/Wert re: sore throat and ear ache  Chief Complaint  Patient presents with  . Follow-up    Pt states that her breathing is doing well. She c/o left side neck and ear pain x 6 days.   Hurts to swallow, no fever, did not activate action plan re ppi for resp and upper airway symptoms. No assoc nasal congestion, fever, discolored sputum    No obvious day to day or daytime variabilty or assoc chronic cough or cp or chest tightness, subjective wheeze overt sinus or hb symptoms. No unusual exp hx or h/o childhood pna/ asthma or knowledge of premature birth.  Sleeping ok without nocturnal  or early am exacerbation  of respiratory  c/o's or need for noct saba. Also denies any obvious fluctuation of symptoms with weather or environmental changes or other aggravating or alleviating factors except as outlined above   Current Medications, Allergies, Complete Past Medical History, Past Surgical History, Family History, and Social History were reviewed in [employer] record.  ROS  The following are not active complaints unless bolded sore throat, dysphagia, dental problems, itching, sneezing,  nasal congestion or excess/ purulent secretions, ear ache,   fever, chills, sweats, unintended wt loss, pleuritic or exertional cp, hemoptysis,  orthopnea pnd or leg swelling, presyncope, palpitations, heartburn, abdominal pain, anorexia, nausea, vomiting, diarrhea  or change in bowel or urinary habits, change in stools or urine, dysuria,hematuria,  rash, arthralgias, visual complaints, headache, numbness weakness or ataxia or problems with walking or coordination,  change in mood/affect or memory.                 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 Marga Melnick  -h/o Daily prednisone since 2007   > off completely Feb 2012 with no problem ? 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 (ICD-280.9)  Health Maintenance.......................................................   [facility]   Family History:  crohn`s in daughter, M-lupus, htn, asthma,  no Ca,  DM, CAD   Social History:   lives with twin children and grandson. single; >20 pack year history, quit in 2005; no EtOH  Laid off  from works for [employer] Sept 2013              Objective:   Physical Exam wt 166 Oct 12, 2009 >170 October 28, 2010 > 169 06/29/2011 >>171 08/24/2011 > 01/15/2012  171 > 04/24/2012 176 > 06/28/2012  175> 08/13/2012 > 08/30/2012  173 > 170 11/28/2012 > 01/29/2013  176 > 174 >173 06/16/2013 > 171 09/18/2013  amb bf nad  HEENT mild turbinate edema. Ears are nl bilaterally. Oropharynx clear  but no excess pnd or cobblestoning. No JVD or cervical adenopathy. Mild accessory muscle hypertrophy. Trachea midline, nl thryroid. Lungs good air movement trace end exp  wheeze.  Regular rate and rhythm without murmur gallop or rub or increase P2 or edema. Abd: no hsm, nl excursion. Ext warm without cyanosis or clubbing.     CXR  12/19/12  No evidence of acute cardiopulmonary disease.  Stable chronic interstitial  markings/scarring.        Assessment:

## 2013-09-18 NOTE — Assessment & Plan Note (Signed)
Positive transbronchial biopsy 02/19/91 by Dr. Marga Melnick  -Daily prednisone since 2007 with ? adrenal insufficiency iatrogenic > off completely Feb 2012 with no problem - PFT's 06/26/08 FEV1  1.09 ratio  - PFTs  03/02/10 FEV1  1.06 (42%) ratio 34 and no better p B2,  DLC0 48% corrects to 64% - PFT's 08/01/2011 FEV1 1.6 (42%)  41 ratio  DLCO 55 corrects 77 - HFA  90% 04/24/2012   No evidence active dz

## 2013-09-18 NOTE — Assessment & Plan Note (Signed)
-   PFT's 06/26/08 FEV1 1.09 ratio  - PFTs 03/02/10 FEV1 1.06 ratio 34    PFT's 08/01/2011 FEV1 1.6 (42%)  41 ratio  DLCO 55 corrects 77 - HFA 75% 01/15/2012  - Spiriva trial March 02, 2010 > ? better but not worse off it 04/2011 -med calendar 06/16/2013   Adequate control on present rx, reviewed > no change in rx needed

## 2013-09-18 NOTE — Assessment & Plan Note (Signed)
Flare of sore throat and ear ache with nl exam c/w gerd > repeated previous rec to double dose off ppi at the onset of any flare of resp or upper airway symptoms

## 2013-09-18 NOTE — Patient Instructions (Signed)
Whenever you start having problems with your breathing/coughing/wheezing/sinus/ ears/ chest pain go ahead and increase the prilosec to Take 30- 60 min before your first and last meals of the day  See calendar for specific medication instructions and bring it back for each and every office visit for every healthcare provider you see.  Without it,  you may not receive the best quality medical care that we feel you deserve.  You will note that the calendar groups together  your maintenance  medications that are timed at particular times of the day.  Think of this as your checklist for what your doctor has instructed you to do until your next evaluation to see what benefit  there is  to staying on a consistent group of medications intended to keep you well.  The other group at the bottom is entirely up to you to use as you see fit  for specific symptoms that may arise between visits that require you to treat them on an as needed basis.  Think of this as your action plan or "what if" list.   Separating the top medications from the bottom group is fundamental to providing you adequate care going forward.    Please schedule a follow up visit in 3 months but call sooner if needed to see Tammy

## 2013-09-30 ENCOUNTER — Encounter (HOSPITAL_COMMUNITY): Payer: Self-pay | Admitting: Emergency Medicine

## 2013-09-30 ENCOUNTER — Emergency Department (HOSPITAL_COMMUNITY)
Admission: EM | Admit: 2013-09-30 | Discharge: 2013-09-30 | Disposition: A | Discharge status: Home / Self Care | Payer: Self-pay | Attending: Emergency Medicine | Admitting: Emergency Medicine

## 2013-09-30 DIAGNOSIS — J029 Acute pharyngitis, unspecified: Secondary | ICD-10-CM | POA: Insufficient documentation

## 2013-09-30 DIAGNOSIS — R110 Nausea: Secondary | ICD-10-CM | POA: Insufficient documentation

## 2013-09-30 DIAGNOSIS — H53149 Visual discomfort, unspecified: Secondary | ICD-10-CM | POA: Insufficient documentation

## 2013-09-30 DIAGNOSIS — R519 Headache, unspecified: Secondary | ICD-10-CM

## 2013-09-30 DIAGNOSIS — R05 Cough: Secondary | ICD-10-CM | POA: Insufficient documentation

## 2013-09-30 DIAGNOSIS — IMO0002 Reserved for concepts with insufficient information to code with codable children: Secondary | ICD-10-CM | POA: Insufficient documentation

## 2013-09-30 DIAGNOSIS — Z7982 Long term (current) use of aspirin: Secondary | ICD-10-CM | POA: Insufficient documentation

## 2013-09-30 DIAGNOSIS — D509 Iron deficiency anemia, unspecified: Secondary | ICD-10-CM | POA: Insufficient documentation

## 2013-09-30 DIAGNOSIS — J069 Acute upper respiratory infection, unspecified: Secondary | ICD-10-CM | POA: Insufficient documentation

## 2013-09-30 DIAGNOSIS — Z87891 Personal history of nicotine dependence: Secondary | ICD-10-CM | POA: Insufficient documentation

## 2013-09-30 DIAGNOSIS — I10 Essential (primary) hypertension: Secondary | ICD-10-CM | POA: Insufficient documentation

## 2013-09-30 DIAGNOSIS — R059 Cough, unspecified: Secondary | ICD-10-CM | POA: Insufficient documentation

## 2013-09-30 DIAGNOSIS — R51 Headache: Secondary | ICD-10-CM | POA: Insufficient documentation

## 2013-09-30 DIAGNOSIS — J3489 Other specified disorders of nose and nasal sinuses: Secondary | ICD-10-CM | POA: Insufficient documentation

## 2013-09-30 DIAGNOSIS — Z79899 Other long term (current) drug therapy: Secondary | ICD-10-CM | POA: Insufficient documentation

## 2013-09-30 DIAGNOSIS — D573 Sickle-cell trait: Secondary | ICD-10-CM | POA: Insufficient documentation

## 2013-09-30 DIAGNOSIS — D869 Sarcoidosis, unspecified: Secondary | ICD-10-CM | POA: Insufficient documentation

## 2013-09-30 DIAGNOSIS — J4489 Other specified chronic obstructive pulmonary disease: Secondary | ICD-10-CM | POA: Insufficient documentation

## 2013-09-30 DIAGNOSIS — J449 Chronic obstructive pulmonary disease, unspecified: Secondary | ICD-10-CM | POA: Insufficient documentation

## 2013-09-30 MED ORDER — BUTALBITAL-APAP-CAFFEINE 50-325-40 MG PO TABS: 1.0000 | ORAL_TABLET | Freq: Four times a day (QID) | ORAL | Status: DC | PRN

## 2013-09-30 MED ORDER — DIPHENHYDRAMINE HCL 50 MG/ML IJ SOLN
25.0000 mg | Freq: Once | INTRAMUSCULAR | Status: AC
  Administered 2013-09-30: 25 mg via INTRAVENOUS
  Filled 2013-09-30: qty 1

## 2013-09-30 MED ORDER — ONDANSETRON 4 MG PO TBDP: 4.0000 mg | ORAL_TABLET | Freq: Three times a day (TID) | ORAL | Status: DC | PRN

## 2013-09-30 MED ORDER — SODIUM CHLORIDE 0.9 % IV BOLUS (SEPSIS)
1000.0000 mL | Freq: Once | INTRAVENOUS | Status: AC
  Administered 2013-09-30: 1000 mL via INTRAVENOUS

## 2013-09-30 MED ORDER — ONDANSETRON HCL 4 MG/2ML IJ SOLN: 4.0000 mg | Freq: Once | INTRAMUSCULAR | Status: DC

## 2013-09-30 MED ORDER — METOCLOPRAMIDE HCL 5 MG/ML IJ SOLN
10.0000 mg | Freq: Once | INTRAMUSCULAR | Status: AC
  Administered 2013-09-30: 10 mg via INTRAVENOUS
  Filled 2013-09-30: qty 2

## 2013-09-30 MED ORDER — SODIUM CHLORIDE 0.9 % IV BOLUS (SEPSIS)
500.0000 mL | Freq: Once | INTRAVENOUS | Status: AC
  Administered 2013-09-30: 500 mL via INTRAVENOUS

## 2013-09-30 NOTE — Progress Notes (Signed)
[facility] provided pt with a [facility] [employer] Card application and a list of primary care resources.  °

## 2013-09-30 NOTE — ED Notes (Signed)
Bed: WA20 Expected date:  Expected time:  Means of arrival:  Comments: EMS/52 yo female with HA/nausea

## 2013-09-30 NOTE — ED Provider Notes (Signed)
Patient reports she had a diffuse headache with frontal pressure and some rhinorrhea and mild cough. She states light can bother her eyes but she does not has noise sensitivity. She denies nausea or vomiting. She denies having pain behind her eyes. She states she's never had a headache like this before. Patient seen after her initial round of medications and she states her headache is improving and now is just in the frontal area which she describes as a pressure but is much improved. She's noted be moving her head freely without apparent neck discomfort.  Medical screening examination/treatment/procedure(s) were conducted as a shared visit with non-physician practitioner(s) and myself.  I personally evaluated the patient during the encounter.  EKG Interpretation   None        Devoria Albe, MD, Armando Gang   Ward Givens, MD 09/30/13 [id number]

## 2013-09-30 NOTE — ED Notes (Signed)
Per [facility] patient from home for headache and nausea. Pt denies emesis.

## 2013-09-30 NOTE — ED Provider Notes (Signed)
CSN: [id number]     Arrival date & time 09/30/13  4098 History   First MD Initiated Contact with Patient 09/30/13 0730     Chief Complaint  Patient presents with  . Headache  . Nausea   (Consider location/radiation/quality/duration/timing/severity/associated sxs/prior Treatment) HPI Comments: The patient is a 52 year-old female with a past medical history of HTN, Sarcoidosis, presenting the Emergency Department with a chief complaint of headache and nausea.   Patient is a 52 y.o. female presenting with headaches. The history is provided by the patient. No language interpreter was used.  Headache Pain location:  Frontal Quality:  Dull Radiates to:  Does not radiate Severity currently:  10/10 Onset quality:  Gradual Duration:  1 day Timing:  Constant Progression:  Worsening Chronicity:  New Similar to prior headaches: yes   Context: bright light   Context: not loud noise   Relieved by:  Nothing Worsened by:  Light Associated symptoms: congestion, cough, nausea, photophobia, sinus pressure, sore throat and URI   Associated symptoms: no abdominal pain, no back pain, no blurred vision, no diarrhea, no drainage, no ear pain, no pain, no fever, no hearing loss, no neck pain, no neck stiffness, no numbness, no paresthesias, no syncope, no visual change and no vomiting   Congestion:    Location:  Nasal   Interferes with eating/drinking: no   Cough:    Cough characteristics:  Productive   Sputum characteristics:  Yellow   Severity:  Moderate   Onset quality:  Gradual   Cough duration: several days.   Progression:  Worsening   Chronicity:  New Nausea:    Severity:  Moderate   Onset quality:  Gradual   Duration:  1 day   Timing:  Constant   Progression:  Unchanged Sore throat:    Severity:  Mild   Onset quality:  Gradual   Past Medical History  Diagnosis Date  . Hypertension   . Sarcoidosis STABLE PER CXR JUNE 2013  . Long-term current use of steroids SYMBICORT INHALER   . Allergic rhinitis   . HTN (hypertension)   . Iron deficiency anemia   . COPD, mild FOLLOWED BY DR [name]  . Sickle cell trait   . 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 Shortness of breath    Past Surgical History  Procedure Laterality Date  . Cesarean section  1986    W/ BILATERAL TUBAL LIGATION  . Total robotic assisted laparoscopic hysterectomy  12-30-2010    SYMPTOMATIC UTERINE FIBROIDS  . Upper teeth extraction's  1992  . Median rectus repair  05/22/2012    Procedure: MEDIAN RECTUS REPAIR;  Surgeon: Corinda Gubler, MD;  Location: [facility];  Service: Ophthalmology;  Laterality: Bilateral;  INFERIOR RECTUS RESECTION WITH ADJUSTIBLE SUTURES RIGHT EYE   . Adjustable suture manipulation  05/22/2012    Procedure: ADJUSTABLE SUTURE MANIPULATION;  Surgeon: Corinda Gubler, MD;  Location: [facility];  Service: Ophthalmology;  Laterality: Right;  . Eye surgery     Family History  Problem Relation Age of Onset  . Hyperlipidemia Mother   . Asthma Mother   . Lupus Mother   . Hypertension Father   . Hyperlipidemia Father   . Hypertension Sister   . Hypertension Brother   . Cancer Neg Hx   . Diabetes Neg Hx   . Coronary artery disease Neg Hx    History  Substance Use Topics  . Smoking status: Former Smoker -- 1.00 packs/day for 20 years  Quit date: 11/14/2003  . Smokeless tobacco: Never Used  . Alcohol Use: No   OB History   Grav Para Term Preterm Abortions TAB SAB Ect Mult Living                 Review of Systems  Constitutional: Negative for fever.  HENT: Positive for congestion, sinus pressure and sore throat. Negative for ear pain, hearing loss and postnasal drip.   Eyes: Positive for photophobia. Negative for blurred vision and pain.  Respiratory: Positive for cough.   Cardiovascular: Negative for syncope.  Gastrointestinal: Positive for nausea. Negative for vomiting, abdominal pain and diarrhea.  Musculoskeletal: Negative for back pain, neck  pain and neck stiffness.  Neurological: Positive for headaches. Negative for numbness and paresthesias.  All other systems reviewed and are negative.    Allergies  Codeine  Home Medications   Current Outpatient Rx  Name  Route  Sig  Dispense  Refill  . albuterol (PROVENTIL HFA;VENTOLIN HFA) 108 (90 BASE) MCG/ACT inhaler   Inhalation   Inhale 2 puffs into the lungs every 4 (four) hours as needed. For shortness of breath or wheezing   1 Inhaler   5   . aspirin EC 81 MG EC tablet   Oral   Take 1 tablet (81 mg total) by mouth daily.   30 tablet   11   . budesonide-formoterol (SYMBICORT) 160-4.5 MCG/ACT inhaler   Inhalation   Inhale 2 puffs into the lungs 2 (two) times daily. 2 puffs each am and 2 each pm   1 Inhaler   0   . Calcium Carb-Cholecalciferol (CALCIUM-VITAMIN D) 600-400 MG-UNIT TABS   Oral   Take 1 tablet by mouth every morning.         . ferrous sulfate 325 (65 FE) MG tablet   Oral   Take 325 mg by mouth every other day.         . fluticasone (FLONASE) 50 MCG/ACT nasal spray   Each Nare   Place 2 sprays into both nostrils daily.         . hydrochlorothiazide (HYDRODIURIL) 12.5 MG tablet   Oral   Take 1 tablet (12.5 mg total) by mouth daily.   30 tablet   0   . metoprolol tartrate (LOPRESSOR) 25 MG tablet   Oral   Take 1 tablet (25 mg total) by mouth 2 (two) times daily.   60 tablet   11   . omeprazole (PRILOSEC) 20 MG capsule      Take 30- 60 min before your first and last meals of the day   180 capsule   3   . oxymetazoline (AFRIN) 0.05 % nasal spray   Each Nare   Place 1 spray into both nostrils 2 (two) times daily as needed for congestion.         . traMADol (ULTRAM) 50 MG tablet      1-2 every 4 hours as needed for cough or pain   40 tablet   0    BP 134/70  Pulse 83  Temp(Src) 98.4 F (36.9 C) (Oral)  Resp 20  SpO2 94% Physical Exam  Nursing note and vitals reviewed. Constitutional: She is oriented to person, place,  and time. Vital signs are normal. She appears well-developed and well-nourished. No distress.  Patient with lights off in the room.  HENT:  Head: Normocephalic and atraumatic.  Right Ear: Tympanic membrane normal. Tympanic membrane is not retracted and not bulging.  Left Ear: Tympanic membrane  normal. Tympanic membrane is not retracted and not bulging.  Nose: Mucosal edema and rhinorrhea present. Right sinus exhibits maxillary sinus tenderness and frontal sinus tenderness. Left sinus exhibits maxillary sinus tenderness and frontal sinus tenderness.  Mouth/Throat: Uvula is midline and mucous membranes are normal. Posterior oropharyngeal erythema present. No oropharyngeal exudate or posterior oropharyngeal edema.  Eyes: Conjunctivae and EOM are normal. Pupils are equal, round, and reactive to light.  Neck: Neck supple. No rigidity.  Cardiovascular: Normal rate, regular rhythm and normal heart sounds.   Pulmonary/Chest: Effort normal and breath sounds normal. No accessory muscle usage. No respiratory distress. She has no decreased breath sounds. She has no wheezes. She has no rhonchi.  Abdominal: Soft. Bowel sounds are normal. She exhibits no distension. There is no tenderness. There is no rebound.  Neurological: She is alert and oriented to person, place, and time. No cranial nerve deficit.  Skin: Skin is warm and dry. She is not diaphoretic.    ED Course  Procedures (including critical care time) Labs Review Labs Reviewed - No data to display Imaging Review No results found.  EKG Interpretation   None       MDM   1. Headache   2. Viral upper respiratory illness    Patient reports a frontal headache with associated nausea. No neurologic deficitst on exam, afebrile, neck without ridgidity.  Fluids, reglan, and benadryl ordered.  Patient reports nausea has improved, currently at headache is 5/10. Fluids ordered. Discussed treatment plan with the patient.  She reports understanding  and no other concerns at this time.   Patient is stable for discharge at this time.   Meds given in ED:  Medications  sodium chloride 0.9 % bolus 1,000 mL (0 mLs Intravenous Stopped 09/30/13 1155)  metoCLOPramide (REGLAN) injection 10 mg (10 mg Intravenous Given 09/30/13 0801)  diphenhydrAMINE (BENADRYL) injection 25 mg (25 mg Intravenous Given 09/30/13 0801)  sodium chloride 0.9 % bolus 500 mL (0 mLs Intravenous Stopped 09/30/13 1155)    Discharge Medication List as of 09/30/2013 11:40 AM    START taking these medications   Details  butalbital-acetaminophen-caffeine (FIORICET) 50-325-40 MG per tablet Take 1-2 tablets by mouth every 6 (six) hours as needed for headache., Starting 09/30/2013, Until Wed 09/30/14, Print    ondansetron (ZOFRAN ODT) 4 MG disintegrating tablet Take 1 tablet (4 mg total) by mouth every 8 (eight) hours as needed for nausea or vomiting., Starting 09/30/2013, Until Discontinued, Print           Clabe Seal, PA-C 10/02/13 1948

## 2013-10-03 NOTE — ED Provider Notes (Signed)
See prior note   Ward Givens, MD 10/03/13 1500

## 2013-10-27 ENCOUNTER — Telehealth: Payer: Self-pay | Admitting: Internal Medicine

## 2013-10-27 NOTE — Telephone Encounter (Signed)
Pt requested sample of symbicort 160.  Advised pt would be at front desk for pick up. Nothing further is needed

## 2013-11-17 ENCOUNTER — Telehealth: Payer: Self-pay | Admitting: Internal Medicine

## 2013-11-17 NOTE — Telephone Encounter (Signed)
   If she has Pharmacist, community the new guarntee is 25 dollars limit on the cost of symbicort - come by and pick up the discount card  and should work fine  There is no cheaper alternative   If no commercial insurance then give one dulera 200 and schedule ov before this runs out to regroup

## 2013-11-17 NOTE — Telephone Encounter (Signed)
lmomtcb x1 

## 2013-11-17 NOTE — Telephone Encounter (Signed)
Called and spoke with pt and she stated that her insurance does not go into effect until 12/2013.  She stated that the symbicort costs her $290 and she cannot afford this.  We have no samples of the symbicort in the office.  Pt is requesting another cheaper alternative to the symbicort.  MW please advise. Thanks  Allergies  Allergen Reactions  . Codeine     Avoids-felt bad when took before     Current Outpatient Prescriptions on File Prior to Visit  Medication Sig Dispense Refill  . albuterol (PROVENTIL HFA;VENTOLIN HFA) 108 (90 BASE) MCG/ACT inhaler Inhale 2 puffs into the lungs every 4 (four) hours as needed for wheezing or shortness of breath. For shortness of breath or wheezing      . aspirin EC 81 MG EC tablet Take 1 tablet (81 mg total) by mouth daily.  30 tablet  11  . budesonide-formoterol (SYMBICORT) 160-4.5 MCG/ACT inhaler Inhale 2 puffs into the lungs 2 (two) times daily. 2 puffs each am and 2 each pm      . butalbital-acetaminophen-caffeine (FIORICET) 50-325-40 MG per tablet Take 1-2 tablets by mouth every 6 (six) hours as needed for headache.  20 tablet  0  . Calcium Carb-Cholecalciferol (CALCIUM-VITAMIN D) 600-400 MG-UNIT TABS Take 1 tablet by mouth every morning.      . ferrous sulfate 325 (65 FE) MG tablet Take 325 mg by mouth every other day.      . fluticasone (FLONASE) 50 MCG/ACT nasal spray Place 2 sprays into both nostrils daily.      . hydrochlorothiazide (HYDRODIURIL) 12.5 MG tablet Take 1 tablet (12.5 mg total) by mouth daily.  30 tablet  0  . metoprolol tartrate (LOPRESSOR) 25 MG tablet Take 1 tablet (25 mg total) by mouth 2 (two) times daily.  60 tablet  11  . omeprazole (PRILOSEC) 20 MG capsule Take 20 mg by mouth daily. Take 30- 60 min before your first and last meals of the day      . ondansetron (ZOFRAN ODT) 4 MG disintegrating tablet Take 1 tablet (4 mg total) by mouth every 8 (eight) hours as needed for nausea or vomiting.  20 tablet  0  . OVER THE COUNTER  MEDICATION Take 1 tablet by mouth 2 (two) times daily.      Marland Kitchen oxymetazoline (AFRIN) 0.05 % nasal spray Place 1 spray into both nostrils 2 (two) times daily as needed for congestion.       No current facility-administered medications on file prior to visit.

## 2013-11-18 MED ORDER — MOMETASONE FURO-FORMOTEROL FUM 200-5 MCG/ACT IN AERO: 2.0000 | INHALATION_SPRAY | Freq: Two times a day (BID) | RESPIRATORY_TRACT | Status: DC

## 2013-11-18 NOTE — Telephone Encounter (Signed)
Spoke with pt. She has no insurance at all. She was giving samples of the dulera and appt scheduled. Nothing further needed

## 2013-11-18 NOTE — Telephone Encounter (Signed)
lmtcb

## 2013-12-19 ENCOUNTER — Ambulatory Visit (INDEPENDENT_AMBULATORY_CARE_PROVIDER_SITE_OTHER): Payer: BC Managed Care – PPO | Admitting: Internal Medicine

## 2013-12-19 ENCOUNTER — Encounter: Payer: Self-pay | Admitting: *Deleted

## 2013-12-19 ENCOUNTER — Encounter: Payer: Self-pay | Admitting: Internal Medicine

## 2013-12-19 VITALS — BP 120/80 | HR 80 | Temp 98.1°F | Ht 65.0 in | Wt 177.0 lb

## 2013-12-19 DIAGNOSIS — J4489 Other specified chronic obstructive pulmonary disease: Secondary | ICD-10-CM

## 2013-12-19 DIAGNOSIS — J449 Chronic obstructive pulmonary disease, unspecified: Secondary | ICD-10-CM

## 2013-12-19 MED ORDER — ALBUTEROL SULFATE HFA 108 (90 BASE) MCG/ACT IN AERS
2.0000 | INHALATION_SPRAY | RESPIRATORY_TRACT | Status: DC | PRN
Start: 2013-12-19 — End: 2016-01-13

## 2013-12-19 MED ORDER — OMEPRAZOLE 20 MG PO CPDR
20.0000 mg | DELAYED_RELEASE_CAPSULE | Freq: Every day | ORAL | Status: DC
Start: 2013-12-19 — End: 2015-01-08

## 2013-12-19 MED ORDER — BUDESONIDE-FORMOTEROL FUMARATE 160-4.5 MCG/ACT IN AERO: 2.0000 | INHALATION_SPRAY | Freq: Two times a day (BID) | RESPIRATORY_TRACT | Status: DC

## 2013-12-19 NOTE — Progress Notes (Signed)
Subjective:     Patient ID: [name], female   DOB: 1961/03/07    MRN: [id number]  Brief patient profile:  72 yobf quit smoking 10/2004 dx by transbronchial biopsy 5/92 with NCG inflammatory change consistent with sarcoid. Since that time she's been off and on prednisone multiple  times with symptoms of coughing dyspnea and skin involvement > weaned off chronic prednisone Feb 2012.   Overall she's been doing much better since she stopped smoking 10/2004 and much better since change to Symbicort 160/4.5 2 two times a day, able to do adl's ok without limitations including some steps. Documented GOLD III copd  07/2011     History of Present Illness    11/28/2012 f/u ov/Clarke Amburn cc Worse sob  x 3 days with wheezing and cough-occ prod with yellow sputum assoc with nasal congestion off flonase cause can't afford it, filing for disability.  rec Whenever you start having problems with your breathing/coughing/wheezing go ahead and increase the prilosec to Take 30- 60 min before your first and last meals of the day  Prednisone 10 mg take  4 each am x 2 days,   2 each am x 2 days,  1 each am x2days and stop  For cough use mucinex  Or mucinex dm Please see patient coordinator before you leave today for [place] assistance for Symbicort 160   01/29/2013 f/u ov/Roque Schill cc nasal congestion much better p prednisone and then worse off it with cp > neg cardiac w/u, did not activate the action plan prev outlined rec  Whenever you start having problems with your breathing/coughing/wheezing/sinus/ ears/ chest pain go ahead and increase the prilosec to Take 30- 60 min before your first and last meals of the day  [email or web address] is your source for generic medications, cash only though(no insurance) Prednisone 10 mg take  4 each am x 2 days,   2 each am x 2 days,  1 each am x2days and stop  For cough use mucinex  Or mucinex dm I emphasized that nasal steroids have no immediate benefit     05/29/2013 f/u ov/Shalamar Crays  re flare of resp symptoms, again did not active action plan Chief Complaint  Patient presents with  . Acute Visit    Pt c/o prod cough with yellow to clear sputum, increased SOB, wheezing and bilateral ear pain for approx 2 wks.     Sob only with exertion, responsive to saba with increased use over baseline including noct .  Symptoms were acute with good chronic control of symptoms on symbicort 160 2bid >>augmentin and steroid taper   06/16/2013 Follow and med review  Patient returns for a two-week followup and medication review. Reviewed all her medications organized them into a medication calendar with patient education. Patient was given a [employer] payment card to to help with co -payments for her symbicort inhaler.  Patient had a COPD exacerbation. Last visit. Was given Augmentin and a steroid taper. Patient reports that she is feeling much improved with decreased cough, congestion, and wheezing. She denies any hemoptysis, orthopnea, PND, or leg swelling  Med Cal-- pt reports breathing is doing well since last visit, infection sx are gone-- denies any other concerns at this time Rec Follow medication calendar closely and bring to each visit.   12/19/2013 f/u ov/Sunny Gains re: copd / Gold III - no med calendar but says she's follows it Chief Complaint  Patient presents with  . Follow-up    Breathing unchnanged since last OV-- c/o: wheezing x1  week   did not increase ppi as rec on action plan on med calendar. Doing fine when she keeps up with refills and adheres to the plan outlined on the med calendar.    No obvious day to day or daytime variabilty or assoc chronic cough or cp or chest tightness, subjective wheeze overt sinus or hb symptoms. No unusual exp hx or h/o childhood pna/ asthma or knowledge of premature birth.  Sleeping ok without nocturnal  or early am exacerbation  of respiratory  c/o's or need for noct saba. Also denies any obvious fluctuation of symptoms with weather or environmental  changes or other aggravating or alleviating factors except as outlined above   Current Medications, Allergies, Complete Past Medical History, Past Surgical History, Family History, and Social History were reviewed in [employer] record.  ROS  The following are not active complaints unless bolded sore throat, dysphagia, dental problems, itching, sneezing,  nasal congestion or excess/ purulent secretions, ear ache,   fever, chills, sweats, unintended wt loss, pleuritic or exertional cp, hemoptysis,  orthopnea pnd or leg swelling, presyncope, palpitations, heartburn, abdominal pain, anorexia, nausea, vomiting, diarrhea  or change in bowel or urinary habits, change in stools or urine, dysuria,hematuria,  rash, arthralgias, visual complaints, headache, numbness weakness or ataxia or problems with walking or coordination,  change in mood/affect or memory.                 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 Unice Cobble  -h/o Daily prednisone since 2007   > off completely Feb 2012 with no problem ? 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 (ICD-280.9)  Health Maintenance.......................................................   [facility]   Family History:  crohn`s in daughter, M-lupus, htn, asthma,  no Ca, DM, CAD     Social History:   lives with twin children and grandson. single; >20 pack year history, quit in 2005; no EtOH  Laid off  from works for [employer] Sept 2013              Objective:   Physical Exam wt 166 Oct 12, 2009 >170 October 28, 2010 > 169 06/29/2011 >>171 08/24/2011 > 01/15/2012  171 > 04/24/2012 176 > 06/28/2012  175> 08/13/2012 > 08/30/2012  173 > 170 11/28/2012 > 01/29/2013  176 > 174 >173 06/16/2013 > 171 09/18/2013 > 12/19/2013 177   amb bf nad  HEENT mild turbinate edema. Ears are nl bilaterally. Oropharynx clear  but no excess pnd or cobblestoning. No JVD or cervical adenopathy. Mild accessory muscle hypertrophy. Trachea midline, nl thryroid. Lungs good air movement Erin end exp  wheeze.  Regular rate and rhythm without murmur gallop or rub or increase P2 or edema. Abd: no hsm, nl excursion. Ext warm without cyanosis or clubbing.     CXR  12/19/12  No evidence of acute cardiopulmonary disease.  Stable  chronic interstitial markings/scarring.        Assessment:

## 2013-12-19 NOTE — Patient Instructions (Addendum)
Continue symbicort 160 Take 2 puffs first thing in am and then another 2 puffs about 12 hours later.    Only use your albuterol as a rescue medication to be used if you can't catch your breath by resting or doing a relaxed purse lip breathing pattern.  - The less you use it, the better it will work when you need it. - Ok to use up to 2 puffs  every 4 hours if you must but call for immediate appointment if use goes up over your usual need - Don't leave home without it !!  (think of it like the spare tire for your car)   See calendar for specific medication instructions and bring it back for each and every office visit for every healthcare provider you see.  Without it,  you may not receive the best quality medical care that we feel you deserve.  You will note that the calendar groups together  your maintenance  medications that are timed at particular times of the day.  Think of this as your checklist for what your doctor has instructed you to do until your next evaluation to see what benefit  there is  to staying on a consistent group of medications intended to keep you well.  The other group at the bottom is entirely up to you to use as you see fit  for specific symptoms that may arise between visits that require you to treat them on an as needed basis.  Think of this as your action plan or "what if" list.   Separating the top medications from the bottom group is fundamental to providing you adequate care going forward.    Ok to follow up yearly for refills, call sooner if needed

## 2013-12-20 NOTE — Assessment & Plan Note (Signed)
-   PFT's 06/26/08 FEV1 1.09 ratio  - PFTs 03/02/10 FEV1 1.06 ratio 34    PFT's 08/01/2011 FEV1 1.6 (42%)  41 ratio  DLCO 55 corrects 77 - HFA 75% 01/15/2012  - Spiriva trial March 02, 2010 > ? better but not worse off it 04/2011 -med calendar 06/16/2013 > did not bring to office as requested 12/19/13   Despite issues with compliance,  Adequate control on present rx, reviewed > no change in rx needed      Each maintenance medication was reviewed in detail including most importantly the difference between maintenance and as needed and under what circumstances the prns are to be used. This was done in the context of a medication calendar review which provided the patient with a user-friendly unambiguous mechanism for medication administration and reconciliation and provides an action plan for all active problems. It is critical that this be shown to every doctor  for modification during the office visit if necessary so the patient can use it as a working document.

## 2014-01-02 ENCOUNTER — Other Ambulatory Visit: Payer: Self-pay | Admitting: *Deleted

## 2014-01-02 ENCOUNTER — Telehealth: Payer: Self-pay | Admitting: Family Medicine

## 2014-01-02 MED ORDER — HYDROCHLOROTHIAZIDE 12.5 MG PO TABS: 12.5000 mg | ORAL_TABLET | Freq: Every day | ORAL | Status: DC

## 2014-01-02 NOTE — Telephone Encounter (Signed)
Pt called because she needs a refill on her BP medication. She said that [facility] has sent [place] several request. jw

## 2014-01-19 ENCOUNTER — Ambulatory Visit (INDEPENDENT_AMBULATORY_CARE_PROVIDER_SITE_OTHER): Payer: BC Managed Care – PPO | Admitting: Family Medicine

## 2014-01-19 ENCOUNTER — Encounter: Payer: Self-pay | Admitting: Family Medicine

## 2014-01-19 VITALS — BP 141/69 | HR 75 | Ht 65.0 in | Wt 177.1 lb

## 2014-01-19 DIAGNOSIS — I10 Essential (primary) hypertension: Secondary | ICD-10-CM

## 2014-01-19 DIAGNOSIS — N951 Menopausal and female climacteric states: Secondary | ICD-10-CM | POA: Insufficient documentation

## 2014-01-19 NOTE — Progress Notes (Signed)
[name] is a 53 y.o. female who presents today for HTN and hot flashes, menopausal.  HTN - Well controlled, compliant with medications.    Menopausal hot flashes - Have increased in the past yr, worse especially over the past three months.  She has not tried anything for this.  Previously with hysterectomy w/o BSO performed in 2011.  Does not smoke, no CVD, no FHx or personal hx of Breast or ovarian CA.   Past Medical History  Diagnosis Date  . Hypertension   . Sarcoidosis STABLE PER CXR JUNE 2013  . Long-term current use of steroids SYMBICORT INHALER  . Allergic rhinitis   . HTN (hypertension)   . Iron deficiency anemia   . 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 Sickle cell trait   . 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 Shortness of breath     History  Smoking status  . Former Smoker -- 1.00 packs/day for 20 years  . Quit date: 11/14/2003  Smokeless tobacco  . Never Used    Family History  Problem Relation Age of Onset  . Hyperlipidemia Mother   . Asthma Mother   . Lupus Mother   . Hypertension Father   . Hyperlipidemia Father   . Hypertension Sister   . Hypertension Brother   . Cancer Neg Hx   . Diabetes Neg Hx   . Coronary artery disease Neg Hx     Current Outpatient Prescriptions on File Prior to Visit  Medication Sig Dispense Refill  . albuterol (PROVENTIL HFA;VENTOLIN HFA) 108 (90 BASE) MCG/ACT inhaler Inhale 2 puffs into the lungs every 4 (four) hours as needed for wheezing or shortness of breath. For shortness of breath or wheezing  1 Inhaler  1  . aspirin EC 81 MG EC tablet Take 1 tablet (81 mg total) by mouth daily.  30 tablet  11  . budesonide-formoterol (SYMBICORT) 160-4.5 MCG/ACT inhaler Inhale 2 puffs into the lungs 2 (two) times daily. 2 puffs each am and 2 each pm  1 Inhaler  11  . butalbital-acetaminophen-caffeine (FIORICET) 50-325-40 MG per tablet Take 1-2 tablets by mouth every 6 (six) hours as needed for headache.  20 tablet  0  . Calcium Carb-Cholecalciferol  (CALCIUM-VITAMIN D) 600-400 MG-UNIT TABS Take 1 tablet by mouth every morning.      . ferrous sulfate 325 (65 FE) MG tablet Take 325 mg by mouth every other day.      . fluticasone (FLONASE) 50 MCG/ACT nasal spray Place 2 sprays into both nostrils daily.      . hydrochlorothiazide (HYDRODIURIL) 12.5 MG tablet Take 1 tablet (12.5 mg total) by mouth daily.  90 tablet  3  . metoprolol tartrate (LOPRESSOR) 25 MG tablet Take 1 tablet (25 mg total) by mouth 2 (two) times daily.  60 tablet  11  . omeprazole (PRILOSEC) 20 MG capsule Take 1 capsule (20 mg total) by mouth daily. Take 30- 60 min before your first and last meals of the day  60 capsule  11  . ondansetron (ZOFRAN ODT) 4 MG disintegrating tablet Take 1 tablet (4 mg total) by mouth every 8 (eight) hours as needed for nausea or vomiting.  20 tablet  0  . OVER THE COUNTER MEDICATION Take 1 tablet by mouth 2 (two) times daily.      Marland Kitchen oxymetazoline (AFRIN) 0.05 % nasal spray Place 1 spray into both nostrils 2 (two) times daily as needed for congestion.       No current  facility-administered medications on file prior to visit.    ROS: Per HPI.  All other systems reviewed and are negative.   Physical Exam Filed Vitals:   01/19/14 1546  BP: 141/69  Pulse: 75    Physical Examination: General appearance - alert, well appearing, and in no distress Chest - clear to auscultation, no wheezes, rales or rhonchi, symmetric air entry Heart - normal rate and regular rhythm, no murmurs noted

## 2014-01-19 NOTE — Assessment & Plan Note (Addendum)
Pt with > 1 yr of Sx, worsening over the past 3 months.  Has not tried anything, she would like to try black cohosh at this time.  Next step would be SNRI/SSRI vs clonidine, and lastly short term Estrogen ( No uterus, no risk factors for CVD or hx of breast/ovarian/uterine CA).  Discussed options with pt and will f/u in 3-6 months.  >50% of 25 minute visit spent counseling and discussing options with pt.

## 2014-01-19 NOTE — Patient Instructions (Signed)
Menopause Menopause is the normal time of life when menstrual periods stop completely. Menopause is complete when you have missed 12 consecutive menstrual periods. It usually occurs between the ages of 8 years and 48 years. Very rarely does a woman develop menopause before the age of 60 years. At menopause, your ovaries stop producing the female hormones estrogen and progesterone. This can cause undesirable symptoms and also affect your health. Sometimes the symptoms may occur 4 5 years before the menopause begins. There is no relationship between menopause and:  Oral contraceptives.  Number of children you had.  Race.  The age your menstrual periods started (menarche). Heavy smokers and very thin women may develop menopause earlier in life. CAUSES  The ovaries stop producing the female hormones estrogen and progesterone.  Other causes include:  Surgery to remove both ovaries.  The ovaries stop functioning for no known reason.  Tumors of the pituitary gland in the brain.  Medical disease that affects the ovaries and hormone production.  Radiation treatment to the abdomen or pelvis.  Chemotherapy that affects the ovaries. SYMPTOMS   Hot flashes.  Night sweats.  Decrease in sex drive.  Vaginal dryness and thinning of the vagina causing painful intercourse.  Dryness of the skin and developing wrinkles.  Headaches.  Tiredness.  Irritability.  Memory problems.  Weight gain.  Bladder infections.  Hair growth of the face and chest.  Infertility. More serious symptoms include:  Loss of bone (osteoporosis) causing breaks (fractures).  Depression.  Hardening and narrowing of the arteries (atherosclerosis) causing heart attacks and strokes. DIAGNOSIS   When the menstrual periods have stopped for 12 straight months.  Physical exam.  Hormone studies of the blood. TREATMENT  There are many treatment choices and nearly as many questions about them. The  decisions to treat or not to treat menopausal changes is an individual choice made with your health care provider. Your health care provider can discuss the treatments with you. Together, you can decide which treatment will work best for you. Your treatment choices may include:   Hormone therapy (estrogen and progesterone).  Non-hormonal medicines.  Treating the individual symptoms with medicine (for example antidepressants for depression).  Herbal medicines that may help specific symptoms.  Counseling by a psychiatrist or psychologist.  Group therapy.  Lifestyle changes including:  Eating healthy.  Regular exercise.  Limiting caffeine and alcohol.  Stress management and meditation.  No treatment. HOME CARE INSTRUCTIONS   Take the medicine your health care provider gives you as directed.  Get plenty of sleep and rest.  Exercise regularly.  Eat a diet that contains calcium (good for the bones) and soy products (acts like estrogen hormone).  Avoid alcoholic beverages.  Do not smoke.  If you have hot flashes, dress in layers.  Take supplements, calcium, and vitamin D to strengthen bones.  You can use over-the-counter lubricants or moisturizers for vaginal dryness.  Group therapy is sometimes very helpful.  Acupuncture may be helpful in some cases. SEEK MEDICAL CARE IF:   You are not sure you are in menopause.  You are having menopausal symptoms and need advice and treatment.  You are still having menstrual periods after age 71 years.  You have pain with intercourse.  Menopause is complete (no menstrual period for 12 months) and you develop vaginal bleeding.  You need a referral to a specialist (gynecologist, psychiatrist, or psychologist) for treatment. SEEK IMMEDIATE MEDICAL CARE IF:   You have severe depression.  You have excessive vaginal bleeding.  You fell and think you have a broken bone.  You have pain when you urinate.  You develop leg or  chest pain.  You have a fast pounding heart beat (palpitations).  You have severe headaches.  You develop vision problems.  You feel a lump in your breast.  You have abdominal pain or severe indigestion. Document Released: 01/20/2004 Document Revised: 07/02/2013 Document Reviewed: 05/29/2013 [facility] Patient Information 2014 [place], [place].

## 2014-01-19 NOTE — Assessment & Plan Note (Signed)
Well controlled, continue current meds, f/u in 3-6 months, consider BMET at that time.

## 2014-03-18 ENCOUNTER — Encounter: Payer: Self-pay | Admitting: Family Medicine

## 2014-03-18 ENCOUNTER — Ambulatory Visit (INDEPENDENT_AMBULATORY_CARE_PROVIDER_SITE_OTHER): Payer: BC Managed Care – PPO | Admitting: Family Medicine

## 2014-03-18 VITALS — BP 118/60 | HR 60 | Temp 98.2°F | Ht 64.0 in | Wt 174.0 lb

## 2014-03-18 DIAGNOSIS — R42 Dizziness and giddiness: Secondary | ICD-10-CM

## 2014-03-18 DIAGNOSIS — I10 Essential (primary) hypertension: Secondary | ICD-10-CM

## 2014-03-18 NOTE — Progress Notes (Signed)
[name] is a 53 y.o. female who presents today for concern for hypotension.  Dizziness/Hypotension - Pt concerned about possible hypotension over the last 1-2 weeks.  It happens at any time, not related to position or getting out of bed/chair and does not necessarily get dizzy with standing.  No changes in her medications or travel over that time frame.  Only thing she has noted is decrease PO intake.  C/O occasional tinnitus in her ear but no fever, chills, ataxia, aphasia, blurred vision, diplopia, Chest pain or pressure, increased SOB, lower extremity edema.  She has never had this happen to her before, but does have Hx of vertigo which she states this feels different than those episodes.  Has not tried anything specific for this and cannot relate any position or activity that makes this worse.   Past Medical History  Diagnosis Date  . Hypertension   . Sarcoidosis STABLE PER CXR JUNE 2013  . Long-term current use of steroids SYMBICORT INHALER  . Allergic rhinitis   . HTN (hypertension)   . Iron deficiency anemia   . 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 Sickle cell trait   . 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 Shortness of breath     History  Smoking status  . Former Smoker -- 1.00 packs/day for 20 years  . Quit date: 11/14/2003  Smokeless tobacco  . Never Used    Family History  Problem Relation Age of Onset  . Hyperlipidemia Mother   . Asthma Mother   . Lupus Mother   . Hypertension Father   . Hyperlipidemia Father   . Hypertension Sister   . Hypertension Brother   . Cancer Neg Hx   . Diabetes Neg Hx   . Coronary artery disease Neg Hx     Current Outpatient Prescriptions on File Prior to Visit  Medication Sig Dispense Refill  . albuterol (PROVENTIL HFA;VENTOLIN HFA) 108 (90 BASE) MCG/ACT inhaler Inhale 2 puffs into the lungs every 4 (four) hours as needed for wheezing or shortness of breath. For shortness of breath or wheezing  1 Inhaler  1  . aspirin EC 81 MG EC tablet Take 1  tablet (81 mg total) by mouth daily.  30 tablet  11  . budesonide-formoterol (SYMBICORT) 160-4.5 MCG/ACT inhaler Inhale 2 puffs into the lungs 2 (two) times daily. 2 puffs each am and 2 each pm  1 Inhaler  11  . butalbital-acetaminophen-caffeine (FIORICET) 50-325-40 MG per tablet Take 1-2 tablets by mouth every 6 (six) hours as needed for headache.  20 tablet  0  . Calcium Carb-Cholecalciferol (CALCIUM-VITAMIN D) 600-400 MG-UNIT TABS Take 1 tablet by mouth every morning.      . ferrous sulfate 325 (65 FE) MG tablet Take 325 mg by mouth every other day.      . fluticasone (FLONASE) 50 MCG/ACT nasal spray Place 2 sprays into both nostrils daily.      . hydrochlorothiazide (HYDRODIURIL) 12.5 MG tablet Take 1 tablet (12.5 mg total) by mouth daily.  90 tablet  3  . metoprolol tartrate (LOPRESSOR) 25 MG tablet Take 1 tablet (25 mg total) by mouth 2 (two) times daily.  60 tablet  11  . omeprazole (PRILOSEC) 20 MG capsule Take 1 capsule (20 mg total) by mouth daily. Take 30- 60 min before your first and last meals of the day  60 capsule  11  . ondansetron (ZOFRAN ODT) 4 MG disintegrating tablet Take 1 tablet (4 mg total) by mouth  every 8 (eight) hours as needed for nausea or vomiting.  20 tablet  0  . OVER THE COUNTER MEDICATION Take 1 tablet by mouth 2 (two) times daily.      Marland Kitchen oxymetazoline (AFRIN) 0.05 % nasal spray Place 1 spray into both nostrils 2 (two) times daily as needed for congestion.       No current facility-administered medications on file prior to visit.    ROS: Per HPI.  All other systems reviewed and are negative.   Physical Exam Filed Vitals:   03/18/14 1525  BP: 118/60  Pulse: 60  Temp: 98.2 F (36.8 C)    Physical Examination: General appearance - alert, well appearing, and in no distress Mouth - mucous membranes moist, pharynx normal without lesions Neck - No JVD Chest - Mild wheezing bibasilar, no increased WOB, no accessory muscle use.   Heart - normal rate and regular  rhythm, no murmurs noted Extremities - no pedal edema noted, intact peripheral pulses    Chemistry      Component Value Date/Time   NA 140 09/10/2013 1706   K 3.7 09/10/2013 1706   CL 99 09/10/2013 1706   CO2 33* 09/10/2013 1706   BUN 9 09/10/2013 1706   CREATININE 0.70 09/10/2013 1706   CREATININE 0.73 12/20/2012 0630      Component Value Date/Time   CALCIUM 9.9 09/10/2013 1706   ALKPHOS 72 04/24/2011 1610   AST 15 04/24/2011 1610   ALT 8 04/24/2011 1610   BILITOT 0.3 04/24/2011 1610

## 2014-03-18 NOTE — Patient Instructions (Signed)
Please see Dr. Valentina Lucks in pharmacy clinic for a 24 hour blood pressure monitoring.  I will see you back in one month.  Make sure you get plenty of fluids with the increase in heat.   Thanks, Dr. Awanda Mink

## 2014-03-18 NOTE — Assessment & Plan Note (Signed)
Ongoing now for the last 1-2 weeks, thinks may be more dehydrated but no other changes in her hx.  Concern she may be having orthostatic Sx at times and will set up for 24 hr BP monitoring with Dr. Valentina Lucks, discussed the case with and appreciate recs.  As well, will check CBC for anemia, BMET for electrolytes.  Orthostatic pressures measured today and standing 116/70 in R arm and 3 minutes later standing, 110/80.  No Sx with standing.  Continue current med and f/u in pharmacy clinic.

## 2014-03-18 NOTE — Assessment & Plan Note (Signed)
Well controlled today, 116/80 in R arm.  Continue current regimen, will get BMET today as outlined in dizziness A/P.

## 2014-03-19 LAB — CBC WITH DIFFERENTIAL/PLATELET
BASOS ABS: 0 10*3/uL (ref 0.0–0.1)
BASOS PCT: 0 % (ref 0–1)
Eosinophils Absolute: 0.2 10*3/uL (ref 0.0–0.7)
Eosinophils Relative: 3 % (ref 0–5)
HEMATOCRIT: 36 % (ref 36.0–46.0)
Hemoglobin: 12.6 g/dL (ref 12.0–15.0)
Lymphocytes Relative: 21 % (ref 12–46)
Lymphs Abs: 1.4 10*3/uL (ref 0.7–4.0)
MCH: 25.9 pg — ABNORMAL LOW (ref 26.0–34.0)
MCHC: 35 g/dL (ref 30.0–36.0)
MCV: 74.1 fL — ABNORMAL LOW (ref 78.0–100.0)
MONO ABS: 0.9 10*3/uL (ref 0.1–1.0)
Monocytes Relative: 13 % — ABNORMAL HIGH (ref 3–12)
NEUTROS ABS: 4.2 10*3/uL (ref 1.7–7.7)
Neutrophils Relative %: 63 % (ref 43–77)
Platelets: 354 10*3/uL (ref 150–400)
RBC: 4.86 MIL/uL (ref 3.87–5.11)
RDW: 16 % — AB (ref 11.5–15.5)
WBC: 6.7 10*3/uL (ref 4.0–10.5)

## 2014-03-19 LAB — BASIC METABOLIC PANEL
BUN: 9 mg/dL (ref 6–23)
CO2: 32 mEq/L (ref 19–32)
CREATININE: 0.67 mg/dL (ref 0.50–1.10)
Calcium: 9.6 mg/dL (ref 8.4–10.5)
Chloride: 97 mEq/L (ref 96–112)
Glucose, Bld: 83 mg/dL (ref 70–99)
POTASSIUM: 3.9 meq/L (ref 3.5–5.3)
Sodium: 137 mEq/L (ref 135–145)

## 2014-04-02 ENCOUNTER — Ambulatory Visit (INDEPENDENT_AMBULATORY_CARE_PROVIDER_SITE_OTHER): Payer: BC Managed Care – PPO | Admitting: Pharmacist

## 2014-04-02 ENCOUNTER — Encounter: Payer: Self-pay | Admitting: Pharmacist

## 2014-04-02 VITALS — BP 137/91 | HR 86 | Ht 65.55 in | Wt 176.5 lb

## 2014-04-02 DIAGNOSIS — I10 Essential (primary) hypertension: Secondary | ICD-10-CM

## 2014-04-02 NOTE — Progress Notes (Signed)
S:    Patient arrives in good spirits.  She presents to the clinic for ambulatory blood pressure evaluation.    Medication compliance is reported to be good.  Discussed procedure for wearing the monitor and gave patient written instructions. Monitor was placed on non-dominant (right) arm with instructions to return in the morning.   Current BP Medications include:  Metoprolol and HCTZ  Patient returned to the clinic and reported NOT feeling well and reported that she had a very restless sleep.   She found the blood pressure monitor to be somewhat bothersome and was happy to turn it off this AM.   O:  Last 3 Office BP readings: 118/60 mmHg 141/69 mmHg 120/80 mmHg  Today's Office BP reading: 137/91 mmHg   ABPM Study Data: Arm Placement left arm   Overall Mean 24hr BP:   142/85 mmHg HR: 77   Daytime Mean BP:  147/87 mmHg HR: 77   Nighttime Mean BP:  122/74 mmHg HR: 75   Dipping Pattern: yes  Sys:   17.3%   Dia: 14.3%   [normal dipping ~10-20%]   Non-hypertensive ABPM thresholds: daytime BP <135/85 mmHg, sleeptime BP <120/70 mmHg NICE Hypertension Guidelines ([place]) using ABPM: Stage I: >135/85 mmHg, Stage 2: >150/95 mmHg)   BMET    Component Value Date/Time   NA 137 03/18/2014 1555   K 3.9 03/18/2014 1555   CL 97 03/18/2014 1555   CO2 32 03/18/2014 1555   GLUCOSE 83 03/18/2014 1555   BUN 9 03/18/2014 1555   CREATININE 0.67 03/18/2014 1555   CREATININE 0.73 12/20/2012 0630   CALCIUM 9.6 03/18/2014 1555   GFRNONAA >90 12/20/2012 0630   GFRAA >90 12/20/2012 0630    A/P: PICK 1 OF THE FOLLOWING 2 (DELETE the other AND THEN DELETE THIS LINE OF TEXT) Longstanding history of hypertension found to have mildly elevated BP overall.  Given 24-hour ambulatory blood pressure failed to identify any times when BP dropped significantly during the measurement day.  The evaluation demonstrates overall control with an average blood pressure of 142/85 mmHg, and a nocturnal dipping pattern that is normal.   Changes to medications were not made today secondary to her complaints of "NOT feeling well".   She was worked into a same day appointment due to her complaints of dyspnea and wheezing breathing.  Seen by Dr. Bridgett Larsson.  Results reviewed and written information provided.   F/U Clinic Visit with Dr.Hess

## 2014-04-03 ENCOUNTER — Ambulatory Visit (INDEPENDENT_AMBULATORY_CARE_PROVIDER_SITE_OTHER): Payer: BC Managed Care – PPO | Admitting: Emergency Medicine

## 2014-04-03 VITALS — BP 154/92 | HR 68 | Temp 98.8°F

## 2014-04-03 DIAGNOSIS — J449 Chronic obstructive pulmonary disease, unspecified: Secondary | ICD-10-CM

## 2014-04-03 MED ORDER — FLUCONAZOLE 150 MG PO TABS: 150.0000 mg | ORAL_TABLET | Freq: Once | ORAL | Status: DC

## 2014-04-03 MED ORDER — PREDNISONE 50 MG PO TABS: ORAL_TABLET | ORAL | Status: DC

## 2014-04-03 MED ORDER — DOXYCYCLINE HYCLATE 100 MG PO TABS: 100.0000 mg | ORAL_TABLET | Freq: Two times a day (BID) | ORAL | Status: DC

## 2014-04-03 NOTE — Assessment & Plan Note (Signed)
Longstanding history of hypertension found to have mildly elevated BP overall.  Given 24-hour ambulatory blood pressure failed to identify any times when BP dropped significantly during the measurement day.  The evaluation demonstrates overall control with an average blood pressure of 142/85 mmHg, and a nocturnal dipping pattern that is normal.  Changes to medications were not made today secondary to her complaints of "NOT feeling well".   She was worked into a same day appointment due to her complaints of dyspnea and wheezing breathing.  Seen by Dr. Bridgett Larsson.  Results reviewed and written information provided.   F/U Clinic Visit with Dr.Hess.

## 2014-04-03 NOTE — Patient Instructions (Signed)
It was nice to meet you!  It looks like you caught a virus and it is flaring up your lungs. Take prednisone 1 pill (50mg ) daily for 5 days. Take doxycycline (an antibiotics) 1 pill twice a day for 10 days. Use honey as needed for cough. Follow up next week if you are not feeling better.  If things get worse over the weekend, please go to Urgent Care or the ER.

## 2014-04-03 NOTE — Progress Notes (Signed)
   Subjective:    Patient ID: [name], female    DOB: [birth year], 53 y.o.   MRN: [id number]  HPI REIS PIENTA is here for not feeling well.  She states she just does not feel very well. This started on Monday with a scratchy throat, that turned into a sore throat on Tuesday. She's also had coughing with increased production of clear sputum. She's had some wheezing, but does respond to her albuterol. She will little more short of breath than normal.  Also reports chest congestion, nasal congestion, or rhinorrhea. She's not had any fevers. Denies sick contacts.  Current Outpatient Prescriptions on File Prior to Visit  Medication Sig Dispense Refill  . albuterol (PROVENTIL HFA;VENTOLIN HFA) 108 (90 BASE) MCG/ACT inhaler Inhale 2 puffs into the lungs every 4 (four) hours as needed for wheezing or shortness of breath. For shortness of breath or wheezing  1 Inhaler  1  . aspirin EC 81 MG EC tablet Take 1 tablet (81 mg total) by mouth daily.  30 tablet  11  . budesonide-formoterol (SYMBICORT) 160-4.5 MCG/ACT inhaler Inhale 2 puffs into the lungs 2 (two) times daily. 2 puffs each am and 2 each pm  1 Inhaler  11  . Calcium Carb-Cholecalciferol (CALCIUM-VITAMIN D) 600-400 MG-UNIT TABS Take 1 tablet by mouth every morning.      . ferrous sulfate 325 (65 FE) MG tablet Take 325 mg by mouth every other day.      . fluticasone (FLONASE) 50 MCG/ACT nasal spray Place 2 sprays into both nostrils daily.      . hydrochlorothiazide (HYDRODIURIL) 12.5 MG tablet Take 1 tablet (12.5 mg total) by mouth daily.  90 tablet  3  . metoprolol tartrate (LOPRESSOR) 25 MG tablet Take 1 tablet (25 mg total) by mouth 2 (two) times daily.  60 tablet  11  . Multiple Vitamin (MULTIVITAMIN) capsule Take 1 capsule by mouth daily.      Marland Kitchen omeprazole (PRILOSEC) 20 MG capsule Take 1 capsule (20 mg total) by mouth daily. Take 30- 60 min before your first and last meals of the day  60 capsule  11  . oxymetazoline (AFRIN) 0.05 % nasal  spray Place 1 spray into both nostrils daily as needed for congestion.        No current facility-administered medications on file prior to visit.    I have reviewed and updated the following as appropriate: allergies and current medications SHx: former smoker  Review of Systems See HPI    Objective:   Physical Exam BP 154/92  Pulse 68  Temp(Src) 98.8 F (37.1 C) (Oral)  SpO2 94% Gen: alert, cooperative, NAD HEENT: AT/[place], sclera white, MMM, no pharyngeal erythema or exudate; nasal mucosa mildly erythematous, TMs normal bilaterally Neck: supple CV: RRR, no murmurs Pulm: good air movement, mild scattered expiratory wheezes      Assessment & Plan:

## 2014-04-03 NOTE — Patient Instructions (Signed)
Overall BP control was good.  Sorry you are not feeling well.

## 2014-04-03 NOTE — Assessment & Plan Note (Signed)
Secondary to sarcoidosis per patient. Will treat as COPD exacerbation with prednisone and doxycycline. Recommended honey prn for cough. F/u is not improving next week. Reviewed reasons to go to ER with patient.

## 2014-04-08 NOTE — Progress Notes (Signed)
Patient ID: [name], female   DOB: 1961/10/25, 53 y.o.   MRN: [id number] Reviewed: Agree with Dr. Graylin Shiver management and documentation.

## 2014-04-13 ENCOUNTER — Ambulatory Visit (INDEPENDENT_AMBULATORY_CARE_PROVIDER_SITE_OTHER): Payer: BC Managed Care – PPO | Admitting: Adult Health

## 2014-04-13 ENCOUNTER — Ambulatory Visit (INDEPENDENT_AMBULATORY_CARE_PROVIDER_SITE_OTHER)
Admission: RE | Admit: 2014-04-13 | Discharge: 2014-04-13 | Disposition: A | Discharge status: Home / Self Care | Payer: BC Managed Care – PPO | Source: Ambulatory Visit | Attending: Adult Health | Admitting: Adult Health

## 2014-04-13 ENCOUNTER — Encounter: Payer: Self-pay | Admitting: Adult Health

## 2014-04-13 VITALS — BP 126/80 | HR 64 | Temp 98.3°F | Ht 64.0 in | Wt 179.8 lb

## 2014-04-13 DIAGNOSIS — J4489 Other specified chronic obstructive pulmonary disease: Secondary | ICD-10-CM

## 2014-04-13 DIAGNOSIS — J449 Chronic obstructive pulmonary disease, unspecified: Secondary | ICD-10-CM

## 2014-04-13 DIAGNOSIS — D869 Sarcoidosis, unspecified: Secondary | ICD-10-CM

## 2014-04-13 MED ORDER — HYDROCODONE-HOMATROPINE 5-1.5 MG/5ML PO SYRP: 5.0000 mL | ORAL_SOLUTION | Freq: Four times a day (QID) | ORAL | Status: DC | PRN

## 2014-04-13 MED ORDER — AMOXICILLIN-POT CLAVULANATE 875-125 MG PO TABS: 1.0000 | ORAL_TABLET | Freq: Two times a day (BID) | ORAL | Status: AC

## 2014-04-13 NOTE — Patient Instructions (Signed)
Augmentin 875mg  Twice daily  For 7 days  Mucinex DM Twice daily  As needed  Cough/congestion  Hydromet  1 tsp every 4-6 hr As needed  Cough, may make you sleepy Fluids and rest  Chest xray today  Please contact office for sooner follow up if symptoms do not improve or worsen or seek emergency care  follow up Dr. Melvyn Novas  In 6 weeks and As needed

## 2014-04-15 NOTE — Assessment & Plan Note (Signed)
Flare with URI  Check cxr   Plan  Augmentin 875mg  Twice daily  For 7 days  Mucinex DM Twice daily  As needed  Cough/congestion  Hydromet  1 tsp every 4-6 hr As needed  Cough, may make you sleepy Fluids and rest  Chest xray today  Please contact office for sooner follow up if symptoms do not improve or worsen or seek emergency care  follow up Dr. Melvyn Novas  In 6 weeks and As needed

## 2014-04-15 NOTE — Progress Notes (Signed)
Subjective:     Patient ID: [name], female   DOB: 1961/01/17    MRN: [id number]  Brief patient profile:  20 yobf quit smoking 10/2004 dx by transbronchial biopsy 5/92 with NCG inflammatory change consistent with sarcoid. Since that time she's been off and on prednisone multiple  times with symptoms of coughing dyspnea and skin involvement > weaned off chronic prednisone Feb 2012.   Overall she's been doing much better since she stopped smoking 10/2004 and much better since change to Symbicort 160/4.5 2 two times a day, able to do adl's ok without limitations including some steps. Documented GOLD III copd  07/2011     History of Present Illness    11/28/2012 f/u ov/Wert cc Worse sob  x 3 days with wheezing and cough-occ prod with yellow sputum assoc with nasal congestion off flonase cause can't afford it, filing for disability.  rec Whenever you start having problems with your breathing/coughing/wheezing go ahead and increase the prilosec to Take 30- 60 min before your first and last meals of the day  Prednisone 10 mg take  4 each am x 2 days,   2 each am x 2 days,  1 each am x2days and stop  For cough use mucinex  Or mucinex dm Please see patient coordinator before you leave today for [place] assistance for Symbicort 160   01/29/2013 f/u ov/Wert cc nasal congestion much better p prednisone and then worse off it with cp > neg cardiac w/u, did not activate the action plan prev outlined rec  Whenever you start having problems with your breathing/coughing/wheezing/sinus/ ears/ chest pain go ahead and increase the prilosec to Take 30- 60 min before your first and last meals of the day  [email or web address] is your source for generic medications, cash only though(no insurance) Prednisone 10 mg take  4 each am x 2 days,   2 each am x 2 days,  1 each am x2days and stop  For cough use mucinex  Or mucinex dm I emphasized that nasal steroids have no immediate benefit     05/29/2013 f/u ov/Wert  re flare of resp symptoms, again did not active action plan Chief Complaint  Patient presents with  . Acute Visit    Pt c/o prod cough with yellow to clear sputum, increased SOB, wheezing and bilateral ear pain for approx 2 wks.     Sob only with exertion, responsive to saba with increased use over baseline including noct .  Symptoms were acute with good chronic control of symptoms on symbicort 160 2bid >>augmentin and steroid taper   06/16/2013 Follow and med review  Patient returns for a two-week followup and medication review. Reviewed all her medications organized them into a medication calendar with patient education. Patient was given a Co payment card to to help with co -payments for her symbicort inhaler.  Patient had a COPD exacerbation. Last visit. Was given Augmentin and a steroid taper. Patient reports that she is feeling much improved with decreased cough, congestion, and wheezing. She denies any hemoptysis, orthopnea, PND, or leg swelling  Med Cal-- pt reports breathing is doing well since last visit, infection sx are gone-- denies any other concerns at this time Rec Follow medication calendar closely and bring to each visit.   12/19/2013 f/u ov/Wert re: copd / Gold III - no med calendar but says she's follows it Chief Complaint  Patient presents with  . Follow-up    Breathing unchnanged since last OV-- c/o: wheezing x1  week   did not increase ppi as rec on action plan on med calendar. Doing fine when she keeps up with refills and adheres to the plan outlined on the med calendar.   >no changes   04/13/14 Acute OV  Complains of productive cough, congestion and sore throat. Mucus is very thick , can not get up. Mixed clear and yellow. She denies any fever, or hemoptysis, chest pain, orthopnea, PND, or leg swelling. She denies any recent travel or antibiotic.  Remains on Symbicort 2 puffs twice daily. Says that cough is causing her to not sleep at night.   Current Medications,  Allergies, Complete Past Medical History, Past Surgical History, Family History, and Social History were reviewed in [employer] record.  ROS  The following are not active complaints unless bolded sore throat, dysphagia, dental problems, itching, sneezing,  nasal congestion or excess/ purulent secretions, ear ache,   fever, chills, sweats, unintended wt loss, pleuritic or exertional cp, hemoptysis,  orthopnea pnd or leg swelling, presyncope, palpitations, heartburn, abdominal pain, anorexia, nausea, vomiting, diarrhea  or change in bowel or urinary habits, change in stools or urine, dysuria,hematuria,  rash, arthralgias, visual complaints, headache, numbness weakness or ataxia or problems with walking or coordination,  change in mood/affect or memory.                 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 Unice Cobble  -h/o Daily prednisone since 2007   > off completely Feb 2012 with no problem ? 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 (ICD-280.9)  Health Maintenance.......................................................   [facility]   Family History:  crohn`s in daughter, M-lupus, htn, asthma,  no Ca, DM, CAD    Social History:   lives with twin children and grandson. single; >20 pack year history, quit in  2005; no EtOH  Laid off  from works for [employer] Sept 2013              Objective:   Physical Exam wt 166 Oct 12, 2009 >170 October 28, 2010 > 169 06/29/2011 >>171 08/24/2011 > 01/15/2012  171 > 04/24/2012 176 > 06/28/2012  175> 08/13/2012 > 08/30/2012  173 > 170 11/28/2012 > 01/29/2013  176 > 174 >173 06/16/2013 > 171 09/18/2013 > 12/19/2013 177 >179 6/1   amb bf nad  HEENT mild turbinate edema. Ears are nl bilaterally. Oropharynx clear  but no excess pnd or cobblestoning. No JVD or cervical adenopathy. Mild accessory muscle hypertrophy. Trachea midline, nl thryroid. Lungs good air movement trace end exp  wheeze.  Regular rate and rhythm without murmur gallop or rub or increase P2 or edema. Abd: no hsm, nl excursion. Ext warm without cyanosis or clubbing.     CXR  12/19/12  No evidence of acute cardiopulmonary disease.  Stable chronic interstitial markings/scarring.        Assessment:

## 2014-04-23 ENCOUNTER — Encounter: Payer: Self-pay | Admitting: Internal Medicine

## 2014-04-23 ENCOUNTER — Encounter (INDEPENDENT_AMBULATORY_CARE_PROVIDER_SITE_OTHER): Payer: Self-pay

## 2014-04-23 ENCOUNTER — Ambulatory Visit (INDEPENDENT_AMBULATORY_CARE_PROVIDER_SITE_OTHER): Payer: BC Managed Care – PPO | Admitting: Internal Medicine

## 2014-04-23 VITALS — BP 148/80 | HR 76 | Temp 98.4°F | Ht 64.0 in | Wt 177.0 lb

## 2014-04-23 DIAGNOSIS — J449 Chronic obstructive pulmonary disease, unspecified: Secondary | ICD-10-CM

## 2014-04-23 DIAGNOSIS — R05 Cough: Secondary | ICD-10-CM

## 2014-04-23 DIAGNOSIS — R059 Cough, unspecified: Secondary | ICD-10-CM

## 2014-04-23 DIAGNOSIS — J4489 Other specified chronic obstructive pulmonary disease: Secondary | ICD-10-CM

## 2014-04-23 DIAGNOSIS — D869 Sarcoidosis, unspecified: Secondary | ICD-10-CM

## 2014-04-23 DIAGNOSIS — R058 Other specified cough: Secondary | ICD-10-CM | POA: Insufficient documentation

## 2014-04-23 MED ORDER — PREDNISONE 10 MG PO TABS: ORAL_TABLET | ORAL | Status: DC

## 2014-04-23 NOTE — Patient Instructions (Addendum)
Go ahead and take your cough medication to completely suppress your cough  Prednisone 10 mg take  4 each am x 2 days,   2 each am x 2 days,  1 each am x 2 days and stop   Add pepcid ac 20 mg at bedtime and chlortrimeton 4 mg (over the counter) x 2 at bedtime until cough is 100% gone x 2 weeks  GERD (REFLUX)  is an extremely common cause of respiratory symptoms, many times with no significant heartburn at all.    It can be treated with medication, but also with lifestyle changes including avoidance of late meals, excessive alcohol, smoking cessation, and avoid fatty foods, chocolate, peppermint, colas, red wine, and acidic juices such as orange juice.  NO MINT OR MENTHOL PRODUCTS SO NO COUGH DROPS  USE SUGARLESS CANDY INSTEAD (jolley ranchers or Stover's)  NO OIL BASED VITAMINS - use powdered substitutes.    Please see patient coordinator before you leave today  to schedule sinus CT   If not better return in 4 weeks with all meds and med calendar

## 2014-04-23 NOTE — Progress Notes (Signed)
Subjective:    Patient ID: [name], female   DOB: April 22, 1961    MRN: [id number]    Brief patient profile:  1 yobf quit smoking 10/2004 dx by transbronchial biopsy 5/92 with NCG inflammatory change consistent with sarcoid. Since that time she's been off and on prednisone multiple  times with symptoms of coughing dyspnea and skin involvement > weaned off chronic prednisone Feb 2012.   Overall she's been doing much better since she stopped smoking 10/2004 and much better since change to Symbicort 160/4.5 2 two times a day, able to do adl's ok without limitations including some steps. Documented GOLD III copd  07/2011     History of Present Illness   11/28/2012 f/u ov/Wert cc Worse sob  x 3 days with wheezing and cough-occ prod with yellow sputum assoc with nasal congestion off flonase cause can't afford it, filing for disability.  rec Whenever you start having problems with your breathing/coughing/wheezing go ahead and increase the prilosec to Take 30- 60 min before your first and last meals of the day  Prednisone 10 mg take  4 each am x 2 days,   2 each am x 2 days,  1 each am x2days and stop  For cough use mucinex  Or mucinex dm Please see patient coordinator before you leave today for [place] assistance for Symbicort 160   01/29/2013 f/u ov/Wert cc nasal congestion much better p prednisone and then worse off it with cp > neg cardiac w/u, did not activate the action plan prev outlined rec  Whenever you start having problems with your breathing/coughing/wheezing/sinus/ ears/ chest pain go ahead and increase the prilosec to Take 30- 60 min before your first and last meals of the day  [email or web address] is your source for generic medications, cash only though(no insurance) Prednisone 10 mg take  4 each am x 2 days,   2 each am x 2 days,  1 each am x2days and stop  For cough use mucinex  Or mucinex dm I emphasized that nasal steroids have no immediate benefit     05/29/2013 f/u ov/Wert  re flare of resp symptoms, again did not active action plan Chief Complaint  Patient presents with  . Acute Visit    Pt c/o prod cough with yellow to clear sputum, increased SOB, wheezing and bilateral ear pain for approx 2 wks.     Sob only with exertion, responsive to saba with increased use over baseline including noct .  Symptoms were acute with good chronic control of symptoms on symbicort 160 2bid >>augmentin and steroid taper   06/16/2013 Follow and med review  Patient returns for a two-week followup and medication review. Reviewed all her medications organized them into a medication calendar with patient education. Patient was given a [employer] payment card to to help with co -payments for her symbicort inhaler.  Patient had a COPD exacerbation. Last visit. Was given Augmentin and a steroid taper. Patient reports that she is feeling much improved with decreased cough, congestion, and wheezing. She denies any hemoptysis, orthopnea, PND, or leg swelling  Med Cal-- pt reports breathing is doing well since last visit, infection sx are gone-- denies any other concerns at this time Rec Follow medication calendar closely and bring to each visit.   12/19/2013 f/u ov/Wert re: copd / Gold III - no med calendar but says she's follows it Chief Complaint  Patient presents with  . Follow-up    Breathing unchnanged since last OV-- c/o: wheezing x1  week   did not increase ppi as rec on action plan on med calendar. Doing fine when she keeps up with refills and adheres to the plan outlined on the med calendar.   >no change rx/ follow med calendar and bring to each ov   04/13/14 Acute OV  Complains of productive cough, congestion and sore throat. Mucus is very thick , can not get up. Mixed clear and yellow. She denies any fever, or hemoptysis, chest pain, orthopnea, PND, or leg swelling. She denies any recent travel or antibiotic.  Remains on Symbicort 2 puffs twice daily. Says that cough is causing her to not  sleep at night. rec Augmentin 875mg  Twice daily  For 7 days  Mucinex DM Twice daily  As needed  Cough/congestion  Hydromet  1 tsp every 4-6 hr As needed  Cough, may make you sleepy    04/23/2014 f/u ov/Wert re: cough since late May 2015  Chief Complaint  Patient presents with  . Follow-up    Pt reports cough only minimally improved since eval here on 04/13/14. Sometimes coughs until develops HA and vomiting.    Worse at hs and in am ,states already taking omeprazole 20 mg bid ac (though very hesitant with answers re meds and does not have med calendar with action plan as rec previously  rx doxy/ pred and "no better" but no purulent sputum at all now/ actually very little mucus production unless vomits from coughng Never took hycodan Not limited by breathing from desired activities     No obvious day to day or daytime variabilty or assoc   cp or chest tightness, subjective wheeze overt sinus or hb symptoms. No unusual exp hx or h/o childhood pna/ asthma or knowledge of premature birth.  Sleeping ok without nocturnal  or early am exacerbation  of respiratory  c/o's or need for noct saba. Also denies any obvious fluctuation of symptoms with weather or environmental changes or other aggravating or alleviating factors except as outlined above   Current Medications, Allergies, Complete Past Medical History, Past Surgical History, Family History, and Social History were reviewed in [employer] record.  ROS  The following are not active complaints unless bolded sore throat, dysphagia, dental problems, itching, sneezing,  nasal congestion or excess/ purulent secretions, ear ache,   fever, chills, sweats, unintended wt loss, pleuritic or exertional cp, hemoptysis,  orthopnea pnd or leg swelling, presyncope, palpitations, heartburn, abdominal pain, anorexia, nausea, vomiting, diarrhea  or change in bowel or urinary habits, change in stools or urine, dysuria,hematuria,  rash,  arthralgias, visual complaints, headache, numbness weakness or ataxia or problems with walking or coordination,  change in mood/affect or memory.                 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 Unice Cobble  -h/o Daily prednisone since 2007   > off completely Feb 2012 with no problem ? 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 (ICD-280.9)  Health Maintenance.......................................................   [facility]   Family History:  crohn`s in daughter, M-lupus, htn, asthma,  no Ca, DM, CAD    Social History:   lives with twin children and grandson. single; >20 pack year history, quit in 2005; no EtOH  Laid off  from works for [employer] Sept 2013              Objective:   Physical Exam wt 166 Oct 12, 2009 >170 October 28, 2010 > 169 06/29/2011 >>171 08/24/2011 > 01/15/2012  171 > 04/24/2012 176 > 06/28/2012  175> 08/13/2012 > 08/30/2012  173 > 170 11/28/2012 > 01/29/2013  176 > 174 >173 06/16/2013 > 171 09/18/2013 > 12/19/2013 177 >179 04/13/2014>  04/23/2014 177  amb bf nad  HEENT mild turbinate edema. Ears are nl bilaterally. Oropharynx clear  but no excess pnd or cobblestoning. No JVD or cervical adenopathy. Mild accessory muscle hypertrophy. Trachea midline, nl thryroid. Lungs good air movement trace  end exp  wheeze.  Regular rate and rhythm without murmur gallop or rub or increase P2 or edema. Abd: no hsm, nl excursion. Ext warm without cyanosis or clubbing.     CXR  04/13/14  Stable bilateral lung scarring. No evidence of acute superimposed  abnormality.  .        Assessment:

## 2014-04-24 NOTE — Assessment & Plan Note (Signed)
The most common causes of chronic cough in immunocompetent adults include the following: upper airway cough syndrome (UACS), previously referred to as postnasal drip syndrome (PNDS), which is caused by variety of rhinosinus conditions; (2) asthma; (3) GERD; (4) chronic bronchitis from cigarette smoking or other inhaled environmental irritants; (5) nonasthmatic eosinophilic bronchitis; and (6) bronchiectasis.   These conditions, singly or in combination, have accounted for up to 94% of the causes of chronic cough in prospective studies.   Other conditions have constituted no >6% of the causes in prospective studies These have included bronchogenic carcinoma, chronic interstitial pneumonia, sarcoidosis, left ventricular failure, ACEI-induced cough, and aspiration from a condition associated with pharyngeal dysfunction.    Chronic cough is often simultaneously caused by more than one condition. A single cause has been found from 38 to 82% of the time, multiple causes from 18 to 62%. Multiply caused cough has been the result of three diseases up to 42% of the time.       Based on hx and exam, this is most likely:  Classic Upper airway cough syndrome, so named because it's frequently impossible to sort out how much is  CR/sinusitis with freq throat clearing (which can be related to primary GERD)   vs  causing  secondary (" extra esophageal")  GERD from wide swings in gastric pressure that occur with throat clearing, often  promoting self use of mint and menthol lozenges that reduce the lower esophageal sphincter tone and exacerbate the problem further in a cyclical fashion.   These are the same pts (now being labeled as having "irritable larynx syndrome" by some cough centers) who not infrequently have a history of having failed to tolerate ace inhibitors,  dry powder inhalers or biphosphonates or report having atypical reflux symptoms that don't respond to standard doses of PPI , and are easily confused as  having aecopd or asthma flares by even experienced allergists/ pulmonologists.   The first step is to maximize acid suppression and eliminate cyclical coughing then regroup if the cough persists with med calendar and all meds to do a trust but verify approach here.   See instructions for specific recommendations which were reviewed directly with the patient who was given a copy with highlighter outlining the key components.

## 2014-04-24 NOTE — Assessment & Plan Note (Signed)
Positive transbronchial biopsy 02/19/91 by Dr. Unice Cobble  -Daily prednisone since 2007 with ? adrenal insufficiency iatrogenic > off completely Feb 2012 with no problem - PFT's 06/26/08 FEV1  1.09 ratio  - PFTs  03/02/10 FEV1  1.06 (42%) ratio 34 and no better p B2,  DLC0 48% corrects to 64% - PFT's 08/01/2011 FEV1 1.6 (42%)  41 ratio  DLCO 55 corrects 77  cxr reviewed, no evidence active sarcoid

## 2014-04-24 NOTE — Assessment & Plan Note (Signed)
-   PFT's 06/26/08 FEV1 1.09 ratio  - PFTs 03/02/10 FEV1 1.06 ratio 34    PFT's 08/01/2011 FEV1 1.6 (42%)  41 ratio  DLCO 55 corrects 77 - HFA 75% 01/15/2012  - Spiriva trial March 02, 2010 > ? better but not worse off it 04/2011 -med calendar 06/16/2013 > did not bring to office as requested 12/19/13 or 04/23/14  Cough is not typical at all for copd > see upper airway cough syndrome a/p and no change in rx

## 2014-04-27 ENCOUNTER — Inpatient Hospital Stay: Admission: RE | Admit: 2014-04-27 | Payer: BC Managed Care – PPO | Source: Ambulatory Visit

## 2014-05-01 ENCOUNTER — Ambulatory Visit: Payer: BC Managed Care – PPO | Admitting: Family Medicine

## 2014-05-04 ENCOUNTER — Encounter: Payer: Self-pay | Admitting: Family Medicine

## 2014-05-04 ENCOUNTER — Ambulatory Visit (INDEPENDENT_AMBULATORY_CARE_PROVIDER_SITE_OTHER): Payer: BC Managed Care – PPO | Admitting: Family Medicine

## 2014-05-04 VITALS — BP 120/68 | HR 80 | Temp 98.3°F | Ht 64.0 in | Wt 175.6 lb

## 2014-05-04 DIAGNOSIS — N951 Menopausal and female climacteric states: Secondary | ICD-10-CM

## 2014-05-04 MED ORDER — VENLAFAXINE HCL ER 75 MG PO CP24: 75.0000 mg | ORAL_CAPSULE | Freq: Every day | ORAL | Status: DC

## 2014-05-04 NOTE — Progress Notes (Signed)
EVANIA LYNE is a 53 y.o. female who presents today for  hot flashes, menopausal.   Menopausal hot flashes - Have increased in the past yr, worse especially over the past two weeks.  She has not tried anything for this.  Previously with hysterectomy w/o BSO performed in 2011.  Does not smoke, no CVD, no FHx or personal hx of Breast or ovarian CA.  She does state she has had insomnia 2/2 her hot flashes now and occasionally trouble during the day with functioning.   Past Medical History  Diagnosis Date  . Hypertension   . Sarcoidosis STABLE PER CXR JUNE 2013  . Long-term current use of steroids SYMBICORT INHALER  . Allergic rhinitis   . HTN (hypertension)   . Iron deficiency anemia   . 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 Sickle cell trait   . 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 Shortness of breath     History  Smoking status  . Former Smoker -- 1.00 packs/day for 20 years  . Quit date: 11/14/2003  Smokeless tobacco  . Never Used    Family History  Problem Relation Age of Onset  . Hyperlipidemia Mother   . Asthma Mother   . Lupus Mother   . Hypertension Father   . Hyperlipidemia Father   . Hypertension Sister   . Hypertension Brother   . Cancer Neg Hx   . Diabetes Neg Hx   . Coronary artery disease Neg Hx     Current Outpatient Prescriptions on File Prior to Visit  Medication Sig Dispense Refill  . albuterol (PROVENTIL HFA;VENTOLIN HFA) 108 (90 BASE) MCG/ACT inhaler Inhale 2 puffs into the lungs every 4 (four) hours as needed for wheezing or shortness of breath. For shortness of breath or wheezing  1 Inhaler  1  . aspirin EC 81 MG EC tablet Take 1 tablet (81 mg total) by mouth daily.  30 tablet  11  . budesonide-formoterol (SYMBICORT) 160-4.5 MCG/ACT inhaler Inhale 2 puffs into the lungs 2 (two) times daily. 2 puffs each am and 2 each pm  1 Inhaler  11  . Calcium Carb-Cholecalciferol (CALCIUM-VITAMIN D) 600-400 MG-UNIT TABS Take 1 tablet by mouth every morning.      . ferrous sulfate  325 (65 FE) MG tablet Take 325 mg by mouth every other day.      . fluticasone (FLONASE) 50 MCG/ACT nasal spray Place 2 sprays into both nostrils daily as needed.       . hydrochlorothiazide (HYDRODIURIL) 12.5 MG tablet Take 1 tablet (12.5 mg total) by mouth daily.  90 tablet  3  . HYDROcodone-homatropine (HYDROMET) 5-1.5 MG/5ML syrup Take 5 mLs by mouth every 6 (six) hours as needed for cough.  240 mL  0  . metoprolol tartrate (LOPRESSOR) 25 MG tablet Take 1 tablet (25 mg total) by mouth 2 (two) times daily.  60 tablet  11  . Multiple Vitamin (MULTIVITAMIN) capsule Take 1 capsule by mouth daily.      Marland Kitchen omeprazole (PRILOSEC) 20 MG capsule Take 1 capsule (20 mg total) by mouth daily. Take 30- 60 min before your first and last meals of the day  60 capsule  11  . oxymetazoline (AFRIN) 0.05 % nasal spray Place 1 spray into both nostrils daily as needed for congestion.       . predniSONE (DELTASONE) 10 MG tablet Take  4 each am x 2 days,   2 each am x 2 days,  1 each am x  2 days and stop  14 tablet  0   No current facility-administered medications on file prior to visit.    ROS: Per HPI.  All other systems reviewed and are negative.   Physical Exam Filed Vitals:   05/04/14 1608  BP: 120/68  Pulse:   Temp:     Physical Examination: General appearance - alert, well appearing, and in no distress Chest - clear to auscultation, no wheezes, rales or rhonchi, symmetric air entry Heart - normal rate and regular rhythm, no murmurs noted

## 2014-05-04 NOTE — Assessment & Plan Note (Signed)
Pt continuing to have recurrent Sx, having hard time with managing daily life and sleeping. Will start on Effexor 75 mg qd and f/u in two weeks.  If no improvement can consider increasing to 150 or changing to clonidine/HRT.  No FHx of Breast CA/Ovarian CA/Uterine CA and she had previous hysterectomy.

## 2014-05-04 NOTE — Patient Instructions (Signed)
Venlafaxine extended-release capsules What is this medicine? VENLAFAXINE(VEN la fax een) is used to treat depression, anxiety and panic disorder. This medicine may be used for other purposes; ask your health care [name] or pharmacist if you have questions. COMMON BRAND NAME(S): Effexor XR What should I tell my health care [name] before I take this medicine? They need to know if you have any of these conditions: -bleeding disorders -glaucoma -heart disease -high blood pressure -high cholesterol -kidney disease -liver disease -low levels of sodium in the blood -mania or bipolar disorder -seizures -suicidal thoughts, plans, or attempt; a previous suicide attempt by you or a family -take medicines that treat or prevent blood clots -thyroid disease -an unusual or allergic reaction to venlafaxine, desvenlafaxine, other medicines, foods, dyes, or preservatives -pregnant or trying to get pregnant -breast-feeding How should I use this medicine? Take this medicine by mouth with a full glass of water. Follow the directions on the prescription label. Do not cut, crush, or chew this medicine. Take it with food. If needed, the capsule may be carefully opened and the entire contents sprinkled on a spoonful of cool applesauce. Swallow the applesauce/pellet mixture right away without chewing and follow with a glass of water to ensure complete swallowing of the pellets. Try to take your medicine at about the same time each day. Do not take your medicine more often than directed. Do not stop taking this medicine suddenly except upon the advice of your doctor. Stopping this medicine too quickly may cause serious side effects or your condition may worsen. A special MedGuide will be given to you by the pharmacist with each prescription and refill. Be sure to read this information carefully each time. Talk to your pediatrician regarding the use of this medicine in children. Special care may be  needed. Overdosage: If you think you have taken too much of this medicine contact a poison control center or emergency room at once. NOTE: This medicine is only for you. Do not share this medicine with others. What if I miss a dose? If you miss a dose, take it as soon as you can. If it is almost time for your next dose, take only that dose. Do not take double or extra doses. What may interact with this medicine? Do not take this medicine with any of the following medications: -certain medicines for fungal infections like fluconazole, itraconazole, ketoconazole, posaconazole, voriconazole -cisapride -desvenlafaxine -dofetilide -dronedarone -duloxetine -levomilnacipran -linezolid -MAOIs like Carbex, Eldepryl, Marplan, Nardil, and Parnate -methylene blue (injected into a vein) -milnacipran -pimozide -thioridazine -ziprasidone This medicine may also interact with the following medications: -aspirin and aspirin-like medicines -certain medicines for depression, anxiety, or psychotic disturbances -certain medicines for migraine headaches like almotriptan, eletriptan, frovatriptan, naratriptan, rizatriptan, sumatriptan, zolmitriptan -certain medicines for sleep -certain medicines that treat or prevent blood clots like dalteparin, enoxaparin, warfarin -cimetidine -clozapine -diuretics -fentanyl -furazolidone -indinavir -isoniazid -lithium -metoprolol -NSAIDS, medicines for pain and inflammation, like ibuprofen or naproxen -other medicines that prolong the QT interval (cause an abnormal heart rhythm) -procarbazine -rasagiline -supplements like St. John's wort, kava kava, valerian -tramadol -tryptophan This list may not describe all possible interactions. Give your health care [name] a list of all the medicines, herbs, non-prescription drugs, or dietary supplements you use. Also tell them if you smoke, drink alcohol, or use illegal drugs. Some items may interact with your  medicine. What should I watch for while using this medicine? Tell your doctor if your symptoms do not get better or if they get worse.  Visit your doctor or health care professional for regular checks on your progress. Because it may take several weeks to see the full effects of this medicine, it is important to continue your treatment as prescribed by your doctor. Patients and their families should watch out for new or worsening thoughts of suicide or depression. Also watch out for sudden changes in feelings such as feeling anxious, agitated, panicky, irritable, hostile, aggressive, impulsive, severely restless, overly excited and hyperactive, or not being able to sleep. If this happens, especially at the beginning of treatment or after a change in dose, call your health care professional. This medicine can cause an increase in blood pressure. Check with your doctor for instructions on monitoring your blood pressure while taking this medicine. You may get drowsy or dizzy. Do not drive, use machinery, or do anything that needs mental alertness until you know how this medicine affects you. Do not stand or sit up quickly, especially if you are an older patient. This reduces the risk of dizzy or fainting spells. Alcohol may interfere with the effect of this medicine. Avoid alcoholic drinks. Your mouth may get dry. Chewing sugarless gum, sucking hard candy and drinking plenty of water will help. Contact your doctor if the problem does not go away or is severe. What side effects may I notice from receiving this medicine? Side effects that you should report to your doctor or health care professional as soon as possible: -allergic reactions like skin rash, itching or hives, swelling of the face, lips, or tongue -breathing problems -changes in vision -hallucination, loss of contact with reality -seizures -suicidal thoughts or other mood changes -trouble passing urine or change in the amount of urine -unusual  bleeding or bruising Side effects that usually do not require medical attention (report to your doctor or health care professional if they continue or are bothersome): -change in sex drive or performance -constipation -increased sweating -loss of appetite -nausea -tremors -weight loss This list may not describe all possible side effects. Call your doctor for medical advice about side effects. You may report side effects to FDA at 1-800-FDA-1088. Where should I keep my medicine? Keep out of the reach of children. Store at a controlled temperature between 20 and 25 degrees C (68 degrees and 77 degrees F), in a dry place. Throw away any unused medicine after the expiration date. NOTE: This sheet is a summary. It may not cover all possible information. If you have questions about this medicine, talk to your doctor, pharmacist, or health care [name].  2015, [employer]. (2013-05-27 12:46:03)

## 2014-05-05 ENCOUNTER — Inpatient Hospital Stay: Admission: RE | Admit: 2014-05-05 | Payer: BC Managed Care – PPO | Source: Ambulatory Visit

## 2014-05-13 ENCOUNTER — Other Ambulatory Visit: Payer: BC Managed Care – PPO

## 2014-05-25 ENCOUNTER — Ambulatory Visit: Payer: BC Managed Care – PPO | Admitting: Internal Medicine

## 2014-05-28 ENCOUNTER — Ambulatory Visit (INDEPENDENT_AMBULATORY_CARE_PROVIDER_SITE_OTHER): Payer: BC Managed Care – PPO | Admitting: Family Medicine

## 2014-05-28 ENCOUNTER — Encounter: Payer: Self-pay | Admitting: Family Medicine

## 2014-05-28 VITALS — BP 140/107 | HR 88 | Temp 98.7°F | Wt 175.6 lb

## 2014-05-28 DIAGNOSIS — N951 Menopausal and female climacteric states: Secondary | ICD-10-CM

## 2014-05-28 MED ORDER — GABAPENTIN 300 MG PO CAPS: 300.0000 mg | ORAL_CAPSULE | Freq: Every evening | ORAL | Status: DC | PRN

## 2014-05-28 MED ORDER — VENLAFAXINE HCL ER 150 MG PO CP24: 150.0000 mg | ORAL_CAPSULE | Freq: Every day | ORAL | Status: DC

## 2014-05-28 NOTE — Patient Instructions (Signed)
[name] it was nice seeing you today.  Please start taking the Effexor 150 mg once per day and the neurontin 300 mg at night.  We will see you back in 2-3 weeks.  Thanks, Dr. Awanda Mink

## 2014-05-28 NOTE — Progress Notes (Signed)
[name] is a 53 y.o. female who presents today for  hot flashes, menopausal.   Menopausal hot flashes - Have increased in the past yr, worse especially over the past 6 weeks.  She has now been on Effexor 75 mg qd for the past month with minimal relief.  Previously with hysterectomy w/o BSO performed in 2011.  Does not smoke, no CVD, no FHx or personal hx of Breast or ovarian CA.  She does state she has had insomnia 2/2 her hot flashes now and occasionally trouble during the day with functioning.   Past Medical History  Diagnosis Date  . Hypertension   . Sarcoidosis STABLE PER CXR JUNE 2013  . Long-term current use of steroids SYMBICORT INHALER  . Allergic rhinitis   . HTN (hypertension)   . Iron deficiency anemia   . 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 Sickle cell trait   . 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 Shortness of breath     History  Smoking status  . Former Smoker -- 1.00 packs/day for 20 years  . Quit date: 11/14/2003  Smokeless tobacco  . Never Used    Family History  Problem Relation Age of Onset  . Hyperlipidemia Mother   . Asthma Mother   . Lupus Mother   . Hypertension Father   . Hyperlipidemia Father   . Hypertension Sister   . Hypertension Brother   . Cancer Neg Hx   . Diabetes Neg Hx   . Coronary artery disease Neg Hx     Current Outpatient Prescriptions on File Prior to Visit  Medication Sig Dispense Refill  . albuterol (PROVENTIL HFA;VENTOLIN HFA) 108 (90 BASE) MCG/ACT inhaler Inhale 2 puffs into the lungs every 4 (four) hours as needed for wheezing or shortness of breath. For shortness of breath or wheezing  1 Inhaler  1  . aspirin EC 81 MG EC tablet Take 1 tablet (81 mg total) by mouth daily.  30 tablet  11  . budesonide-formoterol (SYMBICORT) 160-4.5 MCG/ACT inhaler Inhale 2 puffs into the lungs 2 (two) times daily. 2 puffs each am and 2 each pm  1 Inhaler  11  . Calcium Carb-Cholecalciferol (CALCIUM-VITAMIN D) 600-400 MG-UNIT TABS Take 1 tablet by mouth  every morning.      . ferrous sulfate 325 (65 FE) MG tablet Take 325 mg by mouth every other day.      . fluticasone (FLONASE) 50 MCG/ACT nasal spray Place 2 sprays into both nostrils daily as needed.       . hydrochlorothiazide (HYDRODIURIL) 12.5 MG tablet Take 1 tablet (12.5 mg total) by mouth daily.  90 tablet  3  . HYDROcodone-homatropine (HYDROMET) 5-1.5 MG/5ML syrup Take 5 mLs by mouth every 6 (six) hours as needed for cough.  240 mL  0  . metoprolol tartrate (LOPRESSOR) 25 MG tablet Take 1 tablet (25 mg total) by mouth 2 (two) times daily.  60 tablet  11  . Multiple Vitamin (MULTIVITAMIN) capsule Take 1 capsule by mouth daily.      Marland Kitchen omeprazole (PRILOSEC) 20 MG capsule Take 1 capsule (20 mg total) by mouth daily. Take 30- 60 min before your first and last meals of the day  60 capsule  11  . oxymetazoline (AFRIN) 0.05 % nasal spray Place 1 spray into both nostrils daily as needed for congestion.       . predniSONE (DELTASONE) 10 MG tablet Take  4 each am x 2 days,   2 each  am x 2 days,  1 each am x 2 days and stop  14 tablet  0  . venlafaxine XR (EFFEXOR XR) 75 MG 24 hr capsule Take 1 capsule (75 mg total) by mouth daily with breakfast.  30 capsule  1   No current facility-administered medications on file prior to visit.    ROS: Per HPI.  All other systems reviewed and are negative.   Physical Exam Filed Vitals:   05/28/14 1607  BP: 140/107  Pulse: 88  Temp: 98.7 F (37.1 C)    Physical Examination: General appearance - alert, well appearing, and in no distress Chest - clear to auscultation, no wheezes, rales or rhonchi, symmetric air entry Heart - normal rate and regular rhythm, no murmurs noted

## 2014-05-28 NOTE — Assessment & Plan Note (Signed)
Pt continuing to have recurrent Sx, having hard time with managing daily life and sleeping. Increase in Effexor to 150 mg qd and will add neurontin 300 mg qhs. F/U in 2-3 weeks and if minimal improvement, consider addition/switching to Clonidine/HRT. No FHx of Breast CA/Ovarian CA/Uterine CA and she had previous hysterectomy.

## 2014-06-01 ENCOUNTER — Ambulatory Visit: Payer: BC Managed Care – PPO | Admitting: Internal Medicine

## 2014-06-05 ENCOUNTER — Encounter: Payer: Self-pay | Admitting: Family Medicine

## 2014-06-05 ENCOUNTER — Ambulatory Visit (INDEPENDENT_AMBULATORY_CARE_PROVIDER_SITE_OTHER): Payer: BC Managed Care – PPO | Admitting: Family Medicine

## 2014-06-05 VITALS — BP 157/67 | HR 69 | Temp 99.0°F | Ht 64.0 in | Wt 172.0 lb

## 2014-06-05 DIAGNOSIS — B309 Viral conjunctivitis, unspecified: Secondary | ICD-10-CM | POA: Insufficient documentation

## 2014-06-05 NOTE — Assessment & Plan Note (Signed)
Most likely viral in origin as patient was exposed to daughter with it. No discharge, photophobia or pain with eye movements.  - patient is picking up some OTC eye drops.  - if not relief may call back  - should resolve within a week.  - given return precautions.

## 2014-06-05 NOTE — Progress Notes (Signed)
   Subjective:    Patient ID: [name], female    DOB: [birth year], 53 y.o.   MRN: [id number]  HPI  [name] is here for red eye.  History of sarcoidosis and hasn't been on prednisone for 2 months.    Red Eye   Pain: no Burning (mild, mod, severe): mild Hyperemia (diffuse/regional): diffuse  Discharge (purulent/watery): no Trauma: no Photophobia: no Tearing: no Itchingness: yes Unilateral/Bilaterial: unilateral  Anticoagulant use: no Decrease vision: no Diplopia: no Pain with eye movements: no Headache: no  Her daughter had pink eye about one week ago and has since resolved. 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 She denies any fevers.   Current Outpatient Prescriptions on File Prior to Visit  Medication Sig Dispense Refill  . albuterol (PROVENTIL HFA;VENTOLIN HFA) 108 (90 BASE) MCG/ACT inhaler Inhale 2 puffs into the lungs every 4 (four) hours as needed for wheezing or shortness of breath. For shortness of breath or wheezing  1 Inhaler  1  . aspirin EC 81 MG EC tablet Take 1 tablet (81 mg total) by mouth daily.  30 tablet  11  . budesonide-formoterol (SYMBICORT) 160-4.5 MCG/ACT inhaler Inhale 2 puffs into the lungs 2 (two) times daily. 2 puffs each am and 2 each pm  1 Inhaler  11  . Calcium Carb-Cholecalciferol (CALCIUM-VITAMIN D) 600-400 MG-UNIT TABS Take 1 tablet by mouth every morning.      . ferrous sulfate 325 (65 FE) MG tablet Take 325 mg by mouth every other day.      . fluticasone (FLONASE) 50 MCG/ACT nasal spray Place 2 sprays into both nostrils daily as needed.       . gabapentin (NEURONTIN) 300 MG capsule Take 1 capsule (300 mg total) by mouth at bedtime as needed.  30 capsule  0  . hydrochlorothiazide (HYDRODIURIL) 12.5 MG tablet Take 1 tablet (12.5 mg total) by mouth daily.  90 tablet  3  . metoprolol tartrate (LOPRESSOR) 25 MG tablet Take 1 tablet (25 mg total) by mouth 2 (two) times daily.  60 tablet  11  . Multiple  Vitamin (MULTIVITAMIN) capsule Take 1 capsule by mouth daily.      Marland Kitchen omeprazole (PRILOSEC) 20 MG capsule Take 1 capsule (20 mg total) by mouth daily. Take 30- 60 min before your first and last meals of the day  60 capsule  11  . predniSONE (DELTASONE) 10 MG tablet Take  4 each am x 2 days,   2 each am x 2 days,  1 each am x 2 days and stop  14 tablet  0  . venlafaxine XR (EFFEXOR-XR) 150 MG 24 hr capsule Take 1 capsule (150 mg total) by mouth daily with breakfast.  30 capsule  0   No current facility-administered medications on file prior to visit.   Review of Systems See HPI     Objective:   Physical Exam BP 157/67  Pulse 69  Temp(Src) 99 F (37.2 C) (Oral)  Ht 5\' 4"  (1.626 m)  Wt 172 lb (78.019 kg)  BMI 29.51 kg/m2 Gen: NAD, alert, cooperative with exam, well-appearing HEENT: EOMI, diffuse injected conjunctiva, no purulent discharge, no corneal abrasion.       Assessment & Plan:

## 2014-06-05 NOTE — Patient Instructions (Signed)
Thank you for coming in,   Please let me know if the eye drops that you pick up do not work. We can always send something in.   Please let me know if your redness doesn't get better in 5 days.   Please come back if you have any pain with eye movements or decreased vision.    Please feel free to call with any questions or concerns at any time, at [phone]. --Dr. Raeford Razor  Viral Conjunctivitis Conjunctivitis is an irritation (inflammation) of the clear membrane that covers the white part of the eye (the conjunctiva). The irritation can also happen on the underside of the eyelids. Conjunctivitis makes the eye red or pink in color. This is what is commonly known as pink eye. Viral conjunctivitis can spread easily (contagious). CAUSES   Infection from virus on the surface of the eye.  Infection from the irritation or injury of nearby tissues such as the eyelids or cornea.  More serious inflammation or infection on the inside of the eye.  Other eye diseases.  The use of certain eye medications. SYMPTOMS  The normally white color of the eye or the underside of the eyelid is usually pink or red in color. The pink eye is usually associated with irritation, tearing and some sensitivity to light. Viral conjunctivitis is often associated with a clear, watery discharge. If a discharge is present, there may also be some blurred vision in the affected eye. DIAGNOSIS  Conjunctivitis is diagnosed by an eye exam. The eye specialist looks for changes in the surface tissues of the eye which take on changes characteristic of the specific types of conjunctivitis. A sample of any discharge may be collected on a Q-Tip (sterile swap). The sample will be sent to a lab to see whether or not the inflammation is caused by bacterial or viral infection. TREATMENT  Viral conjunctivitis will not respond to medicines that kill germs (antibiotics). Treatment is aimed at stopping a bacterial infection on top of the viral  infection. The goal of treatment is to relieve symptoms (such as itching) with antihistamine drops or other eye medications.  HOME CARE INSTRUCTIONS   To ease discomfort, apply a cool, clean wash cloth to your eye for 10 to 20 minutes, 3 to 4 times a day.  Gently wipe away any drainage from the eye with a warm, wet washcloth or a cotton ball.  Wash your hands often with soap and use paper towels to dry.  Do not share towels or washcloths. This may spread the infection.  Change or wash your pillowcase every day.  You should not use eye make-up until the infection is gone.  Stop using contacts lenses. Ask your eye professional how to sterilize or replace them before using again. This depends on the type of contact lenses used.  Do not touch the edge of the eyelid with the eye drop bottle or ointment tube when applying medications to the affected eye. This will stop you from spreading the infection to the other eye or to others. SEEK IMMEDIATE MEDICAL CARE IF:   The infection has not improved within 3 days of beginning treatment.  A watery discharge from the eye develops.  Pain in the eye increases.  The redness is spreading.  Vision becomes blurred.  An oral temperature above 102 F (38.9 C) develops, or as your caregiver suggests.  Facial pain, redness or swelling develops.  Any problems that may be related to the prescribed medicine develop. MAKE SURE YOU:  Understand these instructions.  Will watch your condition.  Will get help right away if you are not doing well or get worse. Document Released: 10/30/2005 Document Revised: 01/22/2012 Document Reviewed: 06/18/2008 [facility] Patient Information 2015 [place], [place]. This information is not intended to replace advice given to you by your health care provider. Make sure you discuss any questions you have with your health care provider.

## 2014-06-08 ENCOUNTER — Ambulatory Visit (INDEPENDENT_AMBULATORY_CARE_PROVIDER_SITE_OTHER): Payer: BC Managed Care – PPO | Admitting: Family Medicine

## 2014-06-08 ENCOUNTER — Encounter: Payer: Self-pay | Admitting: Family Medicine

## 2014-06-08 VITALS — BP 162/86 | HR 68 | Temp 98.2°F | Ht 64.0 in | Wt 175.0 lb

## 2014-06-08 DIAGNOSIS — R040 Epistaxis: Secondary | ICD-10-CM

## 2014-06-08 LAB — CBC
HEMATOCRIT: 38.9 % (ref 36.0–46.0)
Hemoglobin: 13.1 g/dL (ref 12.0–15.0)
MCH: 25.9 pg — ABNORMAL LOW (ref 26.0–34.0)
MCHC: 33.7 g/dL (ref 30.0–36.0)
MCV: 76.9 fL — ABNORMAL LOW (ref 78.0–100.0)
Platelets: 327 10*3/uL (ref 150–400)
RBC: 5.06 MIL/uL (ref 3.87–5.11)
RDW: 16 % — AB (ref 11.5–15.5)
WBC: 7.4 10*3/uL (ref 4.0–10.5)

## 2014-06-08 NOTE — Progress Notes (Signed)
   Subjective:    Patient ID: [name], female    DOB: 06/27/1961, 53 y.o.   MRN: [id number]  HPI: Pt presents to clinic for SDA for nosebleeds, present for two days (started last night). She states she had a stream of blood from her left nostril for about 20 minutes, then it stopped and she blew a large clot out of her nose, with a slow smaller bleed for a few minutes. She woke up this morning and after her shower, she blew her nose and a large clot came out, and her bleeding started again for 5 minutes. She then had another clot blown out of her nose at work earlier this afternoon with another 5 of bleeding. She takes a daily aspirin but has not taken in about 1 week. She uses Flonase occasionally, but not for about 3 weeks. She felt slightly dizzy and lightheaded with the most recent bleed but did not feel any room-spinning. She denies subjective fever, SOB, cough, or coryza-type symptoms, but she does have some greenish phlegm production in the mornings for about 2 weeks (she feels this might be related to chronic sarcoid and smoking history); of note, pt was treated for a red eye about 1 week ago with over-the-counter allergy drops.  Of note, pt started Venlafaxine about 1 month ago in attempt to help with hot flashes, but it has not been helping. She feels like it may be causing her blood pressure to be elevated, and wonders if that could be contributing to her nosebleeds.  Review of Systems: As above.     Objective:   Physical Exam BP 188/98  Pulse 68  Temp(Src) 98.2 F (36.8 C) (Oral)  Ht 5\' 4"  (1.626 m)  Wt 175 lb (79.379 kg)  BMI 30.02 kg/m2 Manual recheck BP: 162/86 Gen: well-appearing adult female in NAD HEENT: Petroleum/AT, EOMI, PERRLA, TM's clear bilaterally  Nasal mucosae slightly red bilaterally, with small amount of dried blood in left nare; no active bleeding  No obvious septal hematoma noted though nasal vault not well-visualized  Conjunctivae slightly irritated but not  frankly red / draining or obviously infected Cardio: RRR, no murmur appreciated Pulm: CTAB, no wheezes Ext: warm, well-perfused, no LE edema Neuro: mentating well, alert / oriented without gross focal deficit; gait / station intact and normal     Assessment & Plan:  53yo with several episodes of epistaxis in the context of elevated BP and possible URI-type illness +/- allergic component - no active bleeding on exam or signs / symptoms to suggest frank anemia secondary to blood loss - exam otherwise unremarkable other than BP elevation  Plan: Will check CBC today given several episodes of bleed, though doubt frank anemia - discontinue venlafaxine for now given no help with hot flashes and possible contribution to HTN - continue regular medications otherwise, except HOLD ASA or nasal sprays for now - advised on self-care for nose bleeds, avoid blowing nose, etc - strongly recommended f/u with Dr. Awanda Mink as planned next week - reviewed red flags that would prompt return to clinic or presentation to the ED (worse / persistent bleed, s/s of infection or frank anemia, etc) - consider referral to ENT if bleeding becomes persistent or recurrent  Emmaline Kluver, MD PGY-3, [facility] 06/08/2014, 11:23 PM

## 2014-06-08 NOTE — Patient Instructions (Signed)
Thank you for coming in, today!  I think your nosebleeds could be related to your blood pressure. STOP taking the venlafaxine, then talk to Dr. Awanda Mink about it next Monday. DO NOT take any aspirin, Motrin, Aleve, or "NSAID" medication until you see Dr. Awanda Mink.  I will check your blood today to make sure you haven't lost too much blood, with these nose bleeds. Someone will call you if you need to do anything about it. If you don't hear from [place] tomorrow or the next day, just wait to follow up with Dr. Awanda Mink.  If you have ANY of the following, go to the emergency room: - continued bleeding more than another time or two, especially if it lasts longer than 3-5 minutes - fever, vomiting, redness in your nose or eyes or face - pain in your nose or head - chest pain, difficulty breathing, or weakness in any arms or legs or garble speaking  Come back to see Dr. Awanda Mink next Monday as scheduled. If you are still concerned through this week, come back sooner than that. Please feel free to call with any questions or concerns at any time, at [phone]. --Dr. Venetia Maxon

## 2014-06-12 ENCOUNTER — Emergency Department (HOSPITAL_COMMUNITY): Payer: BC Managed Care – PPO

## 2014-06-12 ENCOUNTER — Encounter (HOSPITAL_COMMUNITY): Payer: Self-pay | Admitting: Emergency Medicine

## 2014-06-12 ENCOUNTER — Emergency Department (HOSPITAL_COMMUNITY)
Admission: EM | Admit: 2014-06-12 | Discharge: 2014-06-12 | Disposition: A | Discharge status: Home / Self Care | Payer: BC Managed Care – PPO | Attending: Emergency Medicine | Admitting: Emergency Medicine

## 2014-06-12 DIAGNOSIS — Z7982 Long term (current) use of aspirin: Secondary | ICD-10-CM | POA: Insufficient documentation

## 2014-06-12 DIAGNOSIS — IMO0002 Reserved for concepts with insufficient information to code with codable children: Secondary | ICD-10-CM | POA: Insufficient documentation

## 2014-06-12 DIAGNOSIS — I10 Essential (primary) hypertension: Secondary | ICD-10-CM | POA: Insufficient documentation

## 2014-06-12 DIAGNOSIS — J4489 Other specified chronic obstructive pulmonary disease: Secondary | ICD-10-CM | POA: Insufficient documentation

## 2014-06-12 DIAGNOSIS — Z79899 Other long term (current) drug therapy: Secondary | ICD-10-CM | POA: Insufficient documentation

## 2014-06-12 DIAGNOSIS — R42 Dizziness and giddiness: Secondary | ICD-10-CM | POA: Insufficient documentation

## 2014-06-12 DIAGNOSIS — H9312 Tinnitus, left ear: Secondary | ICD-10-CM

## 2014-06-12 DIAGNOSIS — R0789 Other chest pain: Secondary | ICD-10-CM

## 2014-06-12 DIAGNOSIS — Z8619 Personal history of other infectious and parasitic diseases: Secondary | ICD-10-CM | POA: Insufficient documentation

## 2014-06-12 DIAGNOSIS — H9319 Tinnitus, unspecified ear: Secondary | ICD-10-CM | POA: Insufficient documentation

## 2014-06-12 DIAGNOSIS — R040 Epistaxis: Secondary | ICD-10-CM

## 2014-06-12 DIAGNOSIS — R209 Unspecified disturbances of skin sensation: Secondary | ICD-10-CM | POA: Insufficient documentation

## 2014-06-12 DIAGNOSIS — D509 Iron deficiency anemia, unspecified: Secondary | ICD-10-CM | POA: Insufficient documentation

## 2014-06-12 DIAGNOSIS — R202 Paresthesia of skin: Secondary | ICD-10-CM

## 2014-06-12 DIAGNOSIS — J449 Chronic obstructive pulmonary disease, unspecified: Secondary | ICD-10-CM | POA: Insufficient documentation

## 2014-06-12 DIAGNOSIS — R51 Headache: Secondary | ICD-10-CM | POA: Insufficient documentation

## 2014-06-12 DIAGNOSIS — Z87891 Personal history of nicotine dependence: Secondary | ICD-10-CM | POA: Insufficient documentation

## 2014-06-12 LAB — BASIC METABOLIC PANEL
Anion gap: 11 (ref 5–15)
BUN: 8 mg/dL (ref 6–23)
CO2: 29 meq/L (ref 19–32)
CREATININE: 0.59 mg/dL (ref 0.50–1.10)
Calcium: 9.7 mg/dL (ref 8.4–10.5)
Chloride: 103 mEq/L (ref 96–112)
GFR calc Af Amer: >90 mL/min (ref >90)
GLUCOSE: 110 mg/dL — AB (ref 70–99)
Potassium: 3.8 mEq/L (ref 3.7–5.3)
Sodium: 143 mEq/L (ref 137–147)

## 2014-06-12 LAB — URINALYSIS, ROUTINE W REFLEX MICROSCOPIC
BILIRUBIN URINE: NEGATIVE
Glucose, UA: NEGATIVE mg/dL
HGB URINE DIPSTICK: NEGATIVE
Ketones, ur: NEGATIVE mg/dL
Leukocytes, UA: NEGATIVE
Nitrite: NEGATIVE
Protein, ur: NEGATIVE mg/dL
SPECIFIC GRAVITY, URINE: 1.011 (ref 1.005–1.030)
Urobilinogen, UA: 0.2 mg/dL (ref 0.0–1.0)
pH: 8 (ref 5.0–8.0)

## 2014-06-12 LAB — CBC WITH DIFFERENTIAL/PLATELET
Basophils Absolute: 0 10*3/uL (ref 0.0–0.1)
Basophils Relative: 0 % (ref 0–1)
EOS ABS: 0.2 10*3/uL (ref 0.0–0.7)
Eosinophils Relative: 3 % (ref 0–5)
HEMATOCRIT: 39.6 % (ref 36.0–46.0)
Hemoglobin: 12.9 g/dL (ref 12.0–15.0)
LYMPHS PCT: 22 % (ref 12–46)
Lymphs Abs: 1.3 10*3/uL (ref 0.7–4.0)
MCH: 25.9 pg — AB (ref 26.0–34.0)
MCHC: 32.6 g/dL (ref 30.0–36.0)
MCV: 79.5 fL (ref 78.0–100.0)
MONO ABS: 0.4 10*3/uL (ref 0.1–1.0)
Monocytes Relative: 7 % (ref 3–12)
Neutro Abs: 4.1 10*3/uL (ref 1.7–7.7)
Neutrophils Relative %: 68 % (ref 43–77)
Platelets: 285 10*3/uL (ref 150–400)
RBC: 4.98 MIL/uL (ref 3.87–5.11)
RDW: 15 % (ref 11.5–15.5)
WBC: 6 10*3/uL (ref 4.0–10.5)

## 2014-06-12 LAB — I-STAT TROPONIN, ED: Troponin i, poc: 0 ng/mL (ref 0.00–0.08)

## 2014-06-12 MED ORDER — SODIUM CHLORIDE 0.9 % IV BOLUS (SEPSIS)
1000.0000 mL | Freq: Once | INTRAVENOUS | Status: AC
  Administered 2014-06-12: 1000 mL via INTRAVENOUS

## 2014-06-12 MED ORDER — ONDANSETRON HCL 4 MG/2ML IJ SOLN
4.0000 mg | Freq: Once | INTRAMUSCULAR | Status: AC
  Administered 2014-06-12: 4 mg via INTRAVENOUS
  Filled 2014-06-12: qty 2

## 2014-06-12 NOTE — Discharge Instructions (Signed)
Nosebleed Nosebleeds can be caused by many conditions, including trauma, infections, polyps, foreign bodies, dry mucous membranes or climate, medicines, and air conditioning. Most nosebleeds occur in the front of the nose. Because of this location, most nosebleeds can be controlled by pinching the nostrils gently and continuously for at least 10 to 20 minutes. The long, continuous pressure allows enough time for the blood to clot. If pressure is released during that 10 to 20 minute time period, the process may have to be started again. The nosebleed may stop by itself or quit with pressure, or it may need concentrated heating (cautery) or pressure from packing. HOME CARE INSTRUCTIONS   If your nose was packed, try to maintain the pack inside until your health care provider removes it. If a gauze pack was used and it starts to fall out, gently replace it or cut the end off. Do not cut if a balloon catheter was used to pack the nose. Otherwise, do not remove unless instructed.  Avoid blowing your nose for 12 hours after treatment. This could dislodge the pack or clot and start the bleeding again.  If the bleeding starts again, sit up and bend forward, gently pinching the front half of your nose continuously for 20 minutes.  If bleeding was caused by dry mucous membranes, use over-the-counter saline nasal spray or gel. This will keep the mucous membranes moist and allow them to heal. If you must use a lubricant, choose the water-soluble variety. Use it only sparingly and not within several hours of lying down.  Do not use petroleum jelly or mineral oil, as these may drip into the lungs and cause serious problems.  Maintain humidity in your home by using less air conditioning or by using a humidifier.  Do not use aspirin or medicines which make bleeding more likely. Your health care provider can give you recommendations on this.  Resume normal activities as you are able, but try to avoid straining,  lifting, or bending at the waist for several days.  If the nosebleeds become recurrent and the cause is unknown, your health care provider may suggest laboratory tests. SEEK MEDICAL CARE IF: You have a fever. SEEK IMMEDIATE MEDICAL CARE IF:   Bleeding recurs and cannot be controlled.  There is unusual bleeding from or bruising on other parts of the body.  Nosebleeds continue.  There is any worsening of the condition which originally brought you in.  You become light-headed, feel faint, become sweaty, or vomit blood. MAKE SURE YOU:   Understand these instructions.  Will watch your condition.  Will get help right away if you are not doing well or get worse. Document Released: 08/09/2005 Document Revised: 03/16/2014 Document Reviewed: 09/30/2009 [facility] Patient Information 2015 [place], [place]. This information is not intended to replace advice given to you by your health care provider. Make sure you discuss any questions you have with your health care provider.  Headaches, Frequently Asked Questions MIGRAINE HEADACHES Q: What is migraine? What causes it? How can I treat it? A: Generally, migraine headaches begin as a dull ache. Then they develop into a constant, throbbing, and pulsating pain. You may experience pain at the temples. You may experience pain at the front or back of one or both sides of the head. The pain is usually accompanied by a combination of:  Nausea.  Vomiting.  Sensitivity to light and noise. Some people (about 15%) experience an aura (see below) before an attack. The cause of migraine is believed to be  chemical reactions in the brain. Treatment for migraine may include over-the-counter or prescription medications. It may also include self-help techniques. These include relaxation training and biofeedback.  Q: What is an aura? A: About 15% of people with migraine get an "aura". This is a sign of neurological symptoms that occur before a migraine headache. You  may see wavy or jagged lines, dots, or flashing lights. You might experience tunnel vision or blind spots in one or both eyes. The aura can include visual or auditory hallucinations (something imagined). It may include disruptions in smell (such as strange odors), taste or touch. Other symptoms include:  Numbness.  A "pins and needles" sensation.  Difficulty in recalling or speaking the correct word. These neurological events may last as long as 60 minutes. These symptoms will fade as the headache begins. Q: What is a trigger? A: Certain physical or environmental factors can lead to or "trigger" a migraine. These include:  Foods.  Hormonal changes.  Weather.  Stress. It is important to remember that triggers are different for everyone. To help prevent migraine attacks, you need to figure out which triggers affect you. Keep a headache diary. This is a good way to track triggers. The diary will help you talk to your healthcare professional about your condition. Q: Does weather affect migraines? A: Bright sunshine, hot, humid conditions, and drastic changes in barometric pressure may lead to, or "trigger," a migraine attack in some people. But studies have shown that weather does not act as a trigger for everyone with migraines. Q: What is the link between migraine and hormones? A: Hormones start and regulate many of your body's functions. Hormones keep your body in balance within a constantly changing environment. The levels of hormones in your body are unbalanced at times. Examples are during menstruation, pregnancy, or menopause. That can lead to a migraine attack. In fact, about three quarters of all women with migraine report that their attacks are related to the menstrual cycle.  Q: Is there an increased risk of stroke for migraine sufferers? A: The likelihood of a migraine attack causing a stroke is very remote. That is not to say that migraine sufferers cannot have a stroke associated  with their migraines. In persons under age 65, the most common associated factor for stroke is migraine headache. But over the course of a person's normal life span, the occurrence of migraine headache may actually be associated with a reduced risk of dying from cerebrovascular disease due to stroke.  Q: What are acute medications for migraine? A: Acute medications are used to treat the pain of the headache after it has started. Examples over-the-counter medications, NSAIDs, ergots, and triptans.  Q: What are the triptans? A: Triptans are the newest class of abortive medications. They are specifically targeted to treat migraine. Triptans are vasoconstrictors. They moderate some chemical reactions in the brain. The triptans work on receptors in your brain. Triptans help to restore the balance of a neurotransmitter called serotonin. Fluctuations in levels of serotonin are thought to be a main cause of migraine.  Q: Are over-the-counter medications for migraine effective? A: Over-the-counter, or "OTC," medications may be effective in relieving mild to moderate pain and associated symptoms of migraine. But you should see your caregiver before beginning any treatment regimen for migraine.  Q: What are preventive medications for migraine? A: Preventive medications for migraine are sometimes referred to as "prophylactic" treatments. They are used to reduce the frequency, severity, and length of migraine attacks. Examples of preventive  medications include antiepileptic medications, antidepressants, beta-blockers, calcium channel blockers, and NSAIDs (nonsteroidal anti-inflammatory drugs). Q: Why are anticonvulsants used to treat migraine? A: During the past few years, there has been an increased interest in antiepileptic drugs for the prevention of migraine. They are sometimes referred to as "anticonvulsants". Both epilepsy and migraine may be caused by similar reactions in the brain.  Q: Why are antidepressants  used to treat migraine? A: Antidepressants are typically used to treat people with depression. They may reduce migraine frequency by regulating chemical levels, such as serotonin, in the brain.  Q: What alternative therapies are used to treat migraine? A: The term "alternative therapies" is often used to describe treatments considered outside the scope of conventional Western medicine. Examples of alternative therapy include acupuncture, acupressure, and yoga. Another common alternative treatment is herbal therapy. Some herbs are believed to relieve headache pain. Always discuss alternative therapies with your caregiver before proceeding. Some herbal products contain arsenic and other toxins. TENSION HEADACHES Q: What is a tension-type headache? What causes it? How can I treat it? A: Tension-type headaches occur randomly. They are often the result of temporary stress, anxiety, fatigue, or anger. Symptoms include soreness in your temples, a tightening band-like sensation around your head (a "vice-like" ache). Symptoms can also include a pulling feeling, pressure sensations, and contracting head and neck muscles. The headache begins in your forehead, temples, or the back of your head and neck. Treatment for tension-type headache may include over-the-counter or prescription medications. Treatment may also include self-help techniques such as relaxation training and biofeedback. CLUSTER HEADACHES Q: What is a cluster headache? What causes it? How can I treat it? A: Cluster headache gets its name because the attacks come in groups. The pain arrives with little, if any, warning. It is usually on one side of the head. A tearing or bloodshot eye and a runny nose on the same side of the headache may also accompany the pain. Cluster headaches are believed to be caused by chemical reactions in the brain. They have been described as the most severe and intense of any headache type. Treatment for cluster headache  includes prescription medication and oxygen. SINUS HEADACHES Q: What is a sinus headache? What causes it? How can I treat it? A: When a cavity in the bones of the face and skull (a sinus) becomes inflamed, the inflammation will cause localized pain. This condition is usually the result of an allergic reaction, a tumor, or an infection. If your headache is caused by a sinus blockage, such as an infection, you will probably have a fever. An x-ray will confirm a sinus blockage. Your caregiver's treatment might include antibiotics for the infection, as well as antihistamines or decongestants.  REBOUND HEADACHES Q: What is a rebound headache? What causes it? How can I treat it? A: A pattern of taking acute headache medications too often can lead to a condition known as "rebound headache." A pattern of taking too much headache medication includes taking it more than 2 days per week or in excessive amounts. That means more than the label or a caregiver advises. With rebound headaches, your medications not only stop relieving pain, they actually begin to cause headaches. Doctors treat rebound headache by tapering the medication that is being overused. Sometimes your caregiver will gradually substitute a different type of treatment or medication. Stopping may be a challenge. Regularly overusing a medication increases the potential for serious side effects. Consult a caregiver if you regularly use headache medications more than  2 days per week or more than the label advises. ADDITIONAL QUESTIONS AND ANSWERS Q: What is biofeedback? A: Biofeedback is a self-help treatment. Biofeedback uses special equipment to monitor your body's involuntary physical responses. Biofeedback monitors:  Breathing.  Pulse.  Heart rate.  Temperature.  Muscle tension.  Brain activity. Biofeedback helps you refine and perfect your relaxation exercises. You learn to control the physical responses that are related to stress. Once  the technique has been mastered, you do not need the equipment any more. Q: Are headaches hereditary? A: Four out of five (80%) of people that suffer report a family history of migraine. Scientists are not sure if this is genetic or a family predisposition. Despite the uncertainty, a child has a 50% chance of having migraine if one parent suffers. The child has a 75% chance if both parents suffer.  Q: Can children get headaches? A: By the time they reach high school, most young people have experienced some type of headache. Many safe and effective approaches or medications can prevent a headache from occurring or stop it after it has begun.  Q: What type of doctor should I see to diagnose and treat my headache? A: Start with your primary caregiver. Discuss his or her experience and approach to headaches. Discuss methods of classification, diagnosis, and treatment. Your caregiver may decide to recommend you to a headache specialist, depending upon your symptoms or other physical conditions. Having diabetes, allergies, etc., may require a more comprehensive and inclusive approach to your headache. The National [employer] will provide, upon request, a list of [facility] physician members in your state. Document Released: 01/20/2004 Document Revised: 01/22/2012 Document Reviewed: 06/29/2008 [facility] Patient Information 2015 [place], [place]. This information is not intended to replace advice given to you by your health care provider. Make sure you discuss any questions you have with your health care provider.  Chest Pain (Nonspecific) It is often hard to give a specific diagnosis for the cause of chest pain. There is always a chance that your pain could be related to something serious, such as a heart attack or a blood clot in the lungs. You need to follow up with your health care provider for further evaluation. CAUSES   Heartburn.  Pneumonia or bronchitis.  Anxiety or stress.  Inflammation around  your heart (pericarditis) or lung (pleuritis or pleurisy).  A blood clot in the lung.  A collapsed lung (pneumothorax). It can develop suddenly on its own (spontaneous pneumothorax) or from trauma to the chest.  Shingles infection (herpes zoster virus). The chest wall is composed of bones, muscles, and cartilage. Any of these can be the source of the pain.  The bones can be bruised by injury.  The muscles or cartilage can be strained by coughing or overwork.  The cartilage can be affected by inflammation and become sore (costochondritis). DIAGNOSIS  Lab tests or other studies may be needed to find the cause of your pain. Your health care provider may have you take a test called an ambulatory electrocardiogram (ECG). An ECG records your heartbeat patterns over a 24-hour period. You may also have other tests, such as:  Transthoracic echocardiogram (TTE). During echocardiography, sound waves are used to evaluate how blood flows through your heart.  Transesophageal echocardiogram (TEE).  Cardiac monitoring. This allows your health care provider to monitor your heart rate and rhythm in real time.  Holter monitor. This is a portable device that records your heartbeat and can help diagnose heart arrhythmias. It allows your  health care provider to track your heart activity for several days, if needed.  Stress tests by exercise or by giving medicine that makes the heart beat faster. TREATMENT   Treatment depends on what may be causing your chest pain. Treatment may include:  Acid blockers for heartburn.  Anti-inflammatory medicine.  Pain medicine for inflammatory conditions.  Antibiotics if an infection is present.  You may be advised to change lifestyle habits. This includes stopping smoking and avoiding alcohol, caffeine, and chocolate.  You may be advised to keep your head raised (elevated) when sleeping. This reduces the chance of acid going backward from your stomach into your  esophagus. Most of the time, nonspecific chest pain will improve within 2-3 days with rest and mild pain medicine.  HOME CARE INSTRUCTIONS   If antibiotics were prescribed, take them as directed. Finish them even if you start to feel better.  For the next few days, avoid physical activities that bring on chest pain. Continue physical activities as directed.  Do not use any tobacco products, including cigarettes, chewing tobacco, or electronic cigarettes.  Avoid drinking alcohol.  Only take medicine as directed by your health care provider.  Follow your health care provider's suggestions for further testing if your chest pain does not go away.  Keep any follow-up appointments you made. If you do not go to an appointment, you could develop lasting (chronic) problems with pain. If there is any problem keeping an appointment, call to reschedule. SEEK MEDICAL CARE IF:   Your chest pain does not go away, even after treatment.  You have a rash with blisters on your chest.  You have a fever. SEEK IMMEDIATE MEDICAL CARE IF:   You have increased chest pain or pain that spreads to your arm, neck, jaw, back, or abdomen.  You have shortness of breath.  You have an increasing cough, or you cough up blood.  You have severe back or abdominal pain.  You feel nauseous or vomit.  You have severe weakness.  You faint.  You have chills. This is an emergency. Do not wait to see if the pain will go away. Get medical help at once. Call your local emergency services (911 in [place] Do not drive yourself to the hospital. MAKE SURE YOU:   Understand these instructions.  Will watch your condition.  Will get help right away if you are not doing well or get worse. Document Released: 08/09/2005 Document Revised: 11/04/2013 Document Reviewed: 06/04/2008 [facility] Patient Information 2015 [place], [place]. This information is not intended to replace advice given to you by your health care provider.  Make sure you discuss any questions you have with your health care provider.

## 2014-06-12 NOTE — ED Provider Notes (Signed)
CSN: [id number]     Arrival date & time 06/12/14  0908 History   First MD Initiated Contact with Patient 06/12/14 [phone]     Chief Complaint  Patient presents with  . Headache  . Emesis     (Consider location/radiation/quality/duration/timing/severity/associated sxs/prior Treatment) HPI Comments:  multiple complaints including 5 days intermittent R nare bleeding, chest burning when laying down at night only for past 4-5 nights, 5 days intermittent R hand paresthesias, intermittent L ear pain/tinnitus. She has also had occasional lightheadedness with change of position. Nose has not bled since last night. No fever, chills, cough, SOB, leg swelling.  This is third visit to a medical provider in last 7 days and she also has PCP appt scheduled for 3 days.    Past Medical History  Diagnosis Date  . Hypertension   . Sarcoidosis STABLE PER CXR JUNE 2013  . Long-term current use of steroids SYMBICORT INHALER  . Allergic rhinitis   . HTN (hypertension)   . Iron deficiency anemia   . 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 Sickle cell trait   . 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 Shortness of breath    Past Surgical History  Procedure Laterality Date  . Cesarean section  1986    W/ BILATERAL TUBAL LIGATION  . Total robotic assisted laparoscopic hysterectomy  12-30-2010    SYMPTOMATIC UTERINE FIBROIDS  . Upper teeth extraction's  1992  . Median rectus repair  05/22/2012    Procedure: MEDIAN RECTUS REPAIR;  Surgeon: Dara Hoyer, MD;  Location: [facility];  Service: Ophthalmology;  Laterality: Bilateral;  INFERIOR RECTUS RESECTION WITH ADJUSTIBLE SUTURES RIGHT EYE   . Adjustable suture manipulation  05/22/2012    Procedure: ADJUSTABLE SUTURE MANIPULATION;  Surgeon: Dara Hoyer, MD;  Location: [facility];  Service: Ophthalmology;  Laterality: Right;  . Eye surgery     Family History  Problem Relation Age of Onset  . Hyperlipidemia Mother   . Asthma Mother   . Lupus  Mother   . Hypertension Father   . Hyperlipidemia Father   . Hypertension Sister   . Hypertension Brother   . Cancer Neg Hx   . Diabetes Neg Hx   . Coronary artery disease Neg Hx    History  Substance Use Topics  . Smoking status: Former Smoker -- 1.00 packs/day for 20 years    Quit date: 11/14/2003  . Smokeless tobacco: Never Used  . Alcohol Use: No   OB History   Grav Para Term Preterm Abortions TAB SAB Ect Mult Living                 Review of Systems  Constitutional: Negative for fever, chills, diaphoresis, activity change, appetite change and fatigue.  HENT: Positive for ear pain, nosebleeds and tinnitus. Negative for congestion, facial swelling, rhinorrhea and sore throat.   Eyes: Negative for photophobia and discharge.  Respiratory: Negative for cough, chest tightness and shortness of breath.   Cardiovascular: Positive for chest pain. Negative for palpitations and leg swelling.  Gastrointestinal: Negative for nausea, vomiting, abdominal pain and diarrhea.  Endocrine: Negative for polydipsia and polyuria.  Genitourinary: Negative for dysuria, frequency, difficulty urinating and pelvic pain.  Musculoskeletal: Negative for arthralgias, back pain, neck pain and neck stiffness.  Skin: Negative for color change and wound.  Allergic/Immunologic: Negative for immunocompromised state.  Neurological: Positive for light-headedness and headaches. Negative for weakness and numbness.  Hematological: Does not bruise/bleed easily.  Psychiatric/Behavioral: Negative  for confusion and agitation.      Allergies  Codeine  Home Medications   Prior to Admission medications   Medication Sig Start Date End Date Taking? Authorizing Provider  albuterol (PROVENTIL HFA;VENTOLIN HFA) 108 (90 BASE) MCG/ACT inhaler Inhale 2 puffs into the lungs every 4 (four) hours as needed for wheezing or shortness of breath. For shortness of breath or wheezing 12/19/13  Yes Tanda Rockers, MD  aspirin EC 81  MG EC tablet Take 1 tablet (81 mg total) by mouth daily. 12/21/12  Yes Marin Olp, MD  budesonide-formoterol [facility]) 160-4.5 MCG/ACT inhaler Inhale 2 puffs into the lungs 2 (two) times daily. 2 puffs each am and 2 each pm 12/19/13  Yes Tanda Rockers, MD  Calcium Carb-Cholecalciferol (CALCIUM-VITAMIN D) 600-400 MG-UNIT TABS Take 1 tablet by mouth every morning.   Yes Historical Provider, MD  ferrous sulfate 325 (65 FE) MG tablet Take 325 mg by mouth every other day.   Yes Historical Provider, MD  fluticasone (FLONASE) 50 MCG/ACT nasal spray Place 2 sprays into both nostrils daily as needed for allergies.    Yes Historical Provider, MD  hydrochlorothiazide (HYDRODIURIL) 12.5 MG tablet Take 1 tablet (12.5 mg total) by mouth daily. 01/02/14  Yes Bryan R Hess, DO  metoprolol tartrate (LOPRESSOR) 25 MG tablet Take 1 tablet (25 mg total) by mouth 2 (two) times daily. 07/15/13  Yes Nolon Rod, DO  Multiple Vitamin (MULTIVITAMIN) capsule Take 1 capsule by mouth daily.   Yes Historical Provider, MD  omeprazole (PRILOSEC) 20 MG capsule Take 1 capsule (20 mg total) by mouth daily. Take 30- 60 min before your first and last meals of the day 12/19/13  Yes Tanda Rockers, MD  venlafaxine XR (EFFEXOR-XR) 150 MG 24 hr capsule Take 1 capsule (150 mg total) by mouth daily with breakfast. 05/28/14  Yes Bryan R Hess, DO  gabapentin (NEURONTIN) 300 MG capsule Take 300 mg by mouth at bedtime as needed. 05/28/14   Bryan R Hess, DO   BP 148/83  Pulse 77  Temp(Src) 97.1 F (36.2 C) (Oral)  Resp 8  Ht 5\' 4"  (1.626 m)  Wt 175 lb (79.379 kg)  BMI 30.02 kg/m2  SpO2 97% Physical Exam  Constitutional: She is oriented to person, place, and time. She appears well-developed and well-nourished. No distress.  HENT:  Head: Normocephalic and atraumatic.  Mouth/Throat: No oropharyngeal exudate.  Eyes: Pupils are equal, round, and reactive to light.  Neck: Normal range of motion. Neck supple.  Cardiovascular: Normal rate,  regular rhythm and normal heart sounds.  Exam reveals no gallop and no friction rub.   No murmur heard. Pulmonary/Chest: Effort normal and breath sounds normal. No respiratory distress. She has no wheezes. She has no rales.  Abdominal: Soft. Bowel sounds are normal. She exhibits no distension and no mass. There is no tenderness. There is no rebound and no guarding.  Musculoskeletal: Normal range of motion. She exhibits no edema and no tenderness.  Neurological: She is alert and oriented to person, place, and time. She has normal strength. She displays no tremor. No cranial nerve deficit or sensory deficit. She displays a negative Romberg sign. Coordination normal. GCS eye subscore is 4. GCS verbal subscore is 5. GCS motor subscore is 6.  Skin: Skin is warm and dry.  Psychiatric: She has a normal mood and affect.    ED Course  Procedures (including critical care time) Labs Review Labs Reviewed  CBC WITH DIFFERENTIAL - Abnormal; Notable for  the following:    MCH 25.9 (*)    All other components within normal limits  BASIC METABOLIC PANEL - Abnormal; Notable for the following:    Glucose, Bld 110 (*)    All other components within normal limits  URINE CULTURE  URINALYSIS, ROUTINE W REFLEX MICROSCOPIC  I-STAT TROPOININ, ED    Imaging Review Ct Head Wo Contrast  06/12/2014   CLINICAL DATA:  Headache and nausea and frequent nosebleeds for the past 5 days. ; 08/2009 assess; numbness and tingling in the right hand  EXAM: CT HEAD WITHOUT CONTRAST  TECHNIQUE: Contiguous axial images were obtained from the base of the skull through the vertex without intravenous contrast.  COMPARISON:  None.  FINDINGS: The ventricles are normal in size and position. There is no intracranial hemorrhage nor intracranial mass effect. There is subtle decreased density in the deep white matter of both cerebral hemispheres consistent with chronic small vessel ischemic change. The cerebellum and brainstem are normal.  The  observed paranasal sinuses and mastoid air cells are clear. There is no skull fracture.  IMPRESSION: There is no acute intracranial hemorrhage nor other acute intracranial abnormality. There are mild changes of chronic small vessel ischemia.   Electronically Signed   By: David  Martinique   On: 06/12/2014 11:17     EKG Interpretation None      MDM   Final diagnoses:  Epistaxis  Tinnitus of left ear  Paresthesias in right hand  Atypical chest pain    Pt is a 53 y.o. female with Pmhx as above who presents with multiple complaints including 5 days intermittent R nare bleeding, chest burning when laying down at night only for past 4-5 nights, 5 days intermittent R hand paresthesias, intermittent L ear pain/tinnitus. On PE, VSS, pt in NAD. No current CP, no focal neuro findings on PE, HEENT exam benign except for irritation of R nasal mucosa. UA, CBC, BMP, trop non contributory. CXR and CT head nml. Doubt ACS and suspect GERD is cause of chest burning. Doubt CVA as cause of h/a, intermittent paresthesias and ear ringing. I feel she is safe to f/u with PCP as already scheduled on Monday for f/u. Return precautions given for new or worsening symptoms including worsening pain, fever, SOB.          Neta Ehlers, MD 06/12/14 [phone]

## 2014-06-12 NOTE — ED Notes (Signed)
Per [facility], pt from work for headache and nausea. States she has had nosebleeds for the past 5 days and has been to her PCP for HTN. Should f/u with MD on Monday. Reports ringing in her ears and numbness/tingling to her right hand also. First time vomited on arrival to the ER this morning.

## 2014-06-12 NOTE — ED Notes (Signed)
Family arrived to take pt home.

## 2014-06-12 NOTE — ED Notes (Signed)
PT states she is unable to urinate at this time

## 2014-06-13 LAB — URINE CULTURE

## 2014-06-15 ENCOUNTER — Ambulatory Visit (INDEPENDENT_AMBULATORY_CARE_PROVIDER_SITE_OTHER): Payer: BC Managed Care – PPO | Admitting: Family Medicine

## 2014-06-15 ENCOUNTER — Encounter: Payer: Self-pay | Admitting: Family Medicine

## 2014-06-15 VITALS — BP 140/90 | Temp 97.7°F | Ht 64.0 in | Wt 175.7 lb

## 2014-06-15 DIAGNOSIS — N951 Menopausal and female climacteric states: Secondary | ICD-10-CM

## 2014-06-15 DIAGNOSIS — Z23 Encounter for immunization: Secondary | ICD-10-CM

## 2014-06-15 MED ORDER — TETANUS-DIPHTH-ACELL PERTUSSIS 5-2.5-18.5 LF-MCG/0.5 IM SUSP
0.5000 mL | Freq: Once | INTRAMUSCULAR | Status: AC
  Administered 2014-06-15: 0.5 mL via INTRAMUSCULAR

## 2014-06-15 MED ORDER — ESTRADIOL 0.5 MG PO TABS: 0.5000 mg | ORAL_TABLET | Freq: Every day | ORAL | Status: DC

## 2014-06-15 NOTE — Patient Instructions (Signed)
[name], as always a pleasure to see you.  Please start taking the estrogen 0.5 mg daily and please schedule your mammogram as soon as possible.  We will see you back in 3-4 weeks.  Thanks, Dr. Awanda Mink

## 2014-06-15 NOTE — Assessment & Plan Note (Signed)
Pt has not done well on the Effexor and it had elevated her pressure as well.  Titrated upwards, and has previously been dietary modifications with no relief.  Discussed risk of HRT including clot, CAD, Breast CA and she understands the risks and benefits.  She is up to date with her screenings except mammogram (which she will schedule today) and she does not smoke, no FHx of clot or personal hx of clot, and no CAD.  F/U in 3-4 weeks and discussed call back if no improvement or trouble with side effects.

## 2014-06-15 NOTE — Progress Notes (Signed)
[name] is a 53 y.o. female who presents today for  hot flashes, menopausal.   Menopausal hot flashes - Have increased in the past yr, worse especially over the past 3 months.  She has now been on Effexor 150 mg with minimal relief.  Previously with hysterectomy w/o BSO performed in 2011.  Does not smoke, no CVD, no FHx or personal hx of Breast or ovarian CA.  She does state she has had increasing insomnia 2/2 her hot flashes now and occasionally trouble during the day with functioning.   Past Medical History  Diagnosis Date  . Hypertension   . Sarcoidosis STABLE PER CXR JUNE 2013  . Long-term current use of steroids SYMBICORT INHALER  . Allergic rhinitis   . HTN (hypertension)   . Iron deficiency anemia   . 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 Sickle cell trait   . 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 Shortness of breath     History  Smoking status  . Former Smoker -- 1.00 packs/day for 20 years  . Quit date: 11/14/2003  Smokeless tobacco  . Never Used    Family History  Problem Relation Age of Onset  . Hyperlipidemia Mother   . Asthma Mother   . Lupus Mother   . Hypertension Father   . Hyperlipidemia Father   . Hypertension Sister   . Hypertension Brother   . Cancer Neg Hx   . Diabetes Neg Hx   . Coronary artery disease Neg Hx     Current Outpatient Prescriptions on File Prior to Visit  Medication Sig Dispense Refill  . albuterol (PROVENTIL HFA;VENTOLIN HFA) 108 (90 BASE) MCG/ACT inhaler Inhale 2 puffs into the lungs every 4 (four) hours as needed for wheezing or shortness of breath. For shortness of breath or wheezing  1 Inhaler  1  . aspirin EC 81 MG EC tablet Take 1 tablet (81 mg total) by mouth daily.  30 tablet  11  . budesonide-formoterol (SYMBICORT) 160-4.5 MCG/ACT inhaler Inhale 2 puffs into the lungs 2 (two) times daily. 2 puffs each am and 2 each pm  1 Inhaler  11  . Calcium Carb-Cholecalciferol (CALCIUM-VITAMIN D) 600-400 MG-UNIT TABS Take 1 tablet by mouth every  morning.      . ferrous sulfate 325 (65 FE) MG tablet Take 325 mg by mouth every other day.      . fluticasone (FLONASE) 50 MCG/ACT nasal spray Place 2 sprays into both nostrils daily as needed for allergies.       Marland Kitchen gabapentin (NEURONTIN) 300 MG capsule Take 300 mg by mouth at bedtime as needed.      . hydrochlorothiazide (HYDRODIURIL) 12.5 MG tablet Take 1 tablet (12.5 mg total) by mouth daily.  90 tablet  3  . metoprolol tartrate (LOPRESSOR) 25 MG tablet Take 1 tablet (25 mg total) by mouth 2 (two) times daily.  60 tablet  11  . Multiple Vitamin (MULTIVITAMIN) capsule Take 1 capsule by mouth daily.      Marland Kitchen omeprazole (PRILOSEC) 20 MG capsule Take 1 capsule (20 mg total) by mouth daily. Take 30- 60 min before your first and last meals of the day  60 capsule  11  . venlafaxine XR (EFFEXOR-XR) 150 MG 24 hr capsule Take 1 capsule (150 mg total) by mouth daily with breakfast.  30 capsule  0   No current facility-administered medications on file prior to visit.    ROS: Per HPI.  All other systems reviewed and are  negative.   Physical Exam Filed Vitals:   06/15/14 1609  BP: 140/90  Temp: 97.7 F (36.5 C)    Physical Examination: General appearance - alert, well appearing, and in no distress Chest - clear to auscultation, no wheezes, rales or rhonchi, symmetric air entry Heart - normal rate and regular rhythm, no murmurs noted

## 2014-06-15 NOTE — Addendum Note (Signed)
Addended by: Leonia Corona R on: 06/15/2014 05:03 PM   Modules accepted: Orders

## 2014-06-19 ENCOUNTER — Other Ambulatory Visit: Payer: Self-pay

## 2014-06-19 DIAGNOSIS — Z1231 Encounter for screening mammogram for malignant neoplasm of breast: Secondary | ICD-10-CM

## 2014-06-26 ENCOUNTER — Ambulatory Visit
Admission: RE | Admit: 2014-06-26 | Discharge: 2014-06-26 | Disposition: A | Discharge status: Home / Self Care | Payer: BC Managed Care – PPO | Source: Ambulatory Visit

## 2014-06-26 DIAGNOSIS — Z1231 Encounter for screening mammogram for malignant neoplasm of breast: Secondary | ICD-10-CM

## 2014-06-30 ENCOUNTER — Encounter: Payer: Self-pay | Admitting: Family Medicine

## 2014-06-30 ENCOUNTER — Ambulatory Visit (INDEPENDENT_AMBULATORY_CARE_PROVIDER_SITE_OTHER): Payer: BC Managed Care – PPO | Admitting: Family Medicine

## 2014-06-30 VITALS — BP 118/70 | HR 80 | Temp 98.0°F | Ht 64.0 in | Wt 170.0 lb

## 2014-06-30 DIAGNOSIS — R300 Dysuria: Secondary | ICD-10-CM | POA: Diagnosis not present

## 2014-06-30 LAB — POCT UA - MICROSCOPIC ONLY

## 2014-06-30 LAB — POCT URINALYSIS DIPSTICK

## 2014-06-30 MED ORDER — NITROFURANTOIN MONOHYD MACRO 100 MG PO CAPS: 100.0000 mg | ORAL_CAPSULE | Freq: Two times a day (BID) | ORAL | Status: DC

## 2014-06-30 NOTE — Assessment & Plan Note (Signed)
Present for 4 days with no fevers, chills, or flank pain. UA consistent with UTI with large leukocytes, large RBC, loaded WBC, and 2+ bacteria. - Stay hydrated - Start macrobid twice daily for 7 days. - Return PRN no improvement or worsening. - Urine sent for culture. - Work note provided on request.

## 2014-06-30 NOTE — Patient Instructions (Addendum)
Good to see you. Your urine looks infected. Take nitrofurantoin twice daily for 7 days. Follow up immediately if you have fevers, chills, severe back pain, or other concerns. Otherwise, follow up with Dr Awanda Mink as scheduled. Stay hydrated.  Best,  Hilton Sinclair, MD

## 2014-06-30 NOTE — Progress Notes (Addendum)
Patient ID: [name], female   DOB: 1961-08-30, 53 y.o.   MRN: [id number] Subjective:   CC: Concern for UTI  HPI:   [name] is a 53 y.o. female with ~4 days of burning with urination that has been worsening despite taking 1 tablet of phenazopyridine. She denies fevers, chills, flank or back pain but she reports some blood in urine. Denies dyspareunia or abnormal vaginal discharge.  Review of Systems - Per HPI.   PMH: Reviewed MEdications: Recently started estradiol. Has f/u with Dr Awanda Mink for this tomorrow.    Objective:  Physical Exam BP 118/70  Pulse 80  Temp(Src) 98 F (36.7 C) (Oral)  Ht 5\' 4"  (1.626 m)  Wt 170 lb (77.111 kg)  BMI 29.17 kg/m2 GEN: NAD, pleasant ABD: Suprapubic mild tenderness    Assessment:     [name] is a 53 y.o. female here for dysuria.    Plan:     Dysuria Present for 4 days with no fevers, chills, or flank pain. UA consistent with UTI with large leukocytes, large RBC, loaded WBC, and 2+ bacteria. - Stay hydrated - Start macrobid twice daily for 7 days. - Return PRN no improvement or worsening. - Urine sent for culture. - Work note provided on request.   Hilton Sinclair, MD [place]

## 2014-07-01 ENCOUNTER — Ambulatory Visit: Payer: BC Managed Care – PPO | Admitting: Family Medicine

## 2014-07-03 LAB — URINE CULTURE

## 2014-07-09 ENCOUNTER — Ambulatory Visit (INDEPENDENT_AMBULATORY_CARE_PROVIDER_SITE_OTHER): Payer: BC Managed Care – PPO | Admitting: Family Medicine

## 2014-07-09 ENCOUNTER — Encounter: Payer: Self-pay | Admitting: Family Medicine

## 2014-07-09 VITALS — BP 139/84 | HR 69 | Ht 64.0 in | Wt 181.0 lb

## 2014-07-09 DIAGNOSIS — N951 Menopausal and female climacteric states: Secondary | ICD-10-CM

## 2014-07-09 MED ORDER — ESTRADIOL 0.5 MG PO TABS: 0.5000 mg | ORAL_TABLET | Freq: Every day | ORAL | Status: DC

## 2014-07-09 MED ORDER — FLUCONAZOLE 150 MG PO TABS: 150.0000 mg | ORAL_TABLET | Freq: Once | ORAL | Status: DC

## 2014-07-09 NOTE — Assessment & Plan Note (Signed)
Doing well, continue HRT, f/u in three months.  Risks of HRT discussed with pt previously and she reiterates understanding of these risks.

## 2014-07-09 NOTE — Progress Notes (Signed)
[name] is a 53 y.o. female who presents today for  hot flashes, menopausal.  Menopausal hot flashes - Have increased in the past yr, worse especially over the past 3 months. Previously on effexor with titration that did not help much for her.  Previously with hysterectomy w/o BSO performed in 2011.  Does not smoke, no CVD, no FHx or personal hx of Breast or ovarian CA, mammogram in August 2015 which was BIRADS 1.  She started estrogen therapy (HRT) in the beginning of august 2015 and medication started to help about 2 weeks into the course.  She has been doing well since that point and denies any further hot flashes, night sweats, or fatigue.    Past Medical History  Diagnosis Date  . Hypertension   . Sarcoidosis STABLE PER CXR JUNE 2013  . Long-term current use of steroids SYMBICORT INHALER  . Allergic rhinitis   . HTN (hypertension)   . Iron deficiency anemia   . 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 Sickle cell trait   . 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 Shortness of breath     History  Smoking status  . Former Smoker -- 1.00 packs/day for 20 years  . Quit date: 11/14/2003  Smokeless tobacco  . Never Used    Family History  Problem Relation Age of Onset  . Hyperlipidemia Mother   . Asthma Mother   . Lupus Mother   . Hypertension Father   . Hyperlipidemia Father   . Hypertension Sister   . Hypertension Brother   . Cancer Neg Hx   . Diabetes Neg Hx   . Coronary artery disease Neg Hx     Current Outpatient Prescriptions on File Prior to Visit  Medication Sig Dispense Refill  . albuterol (PROVENTIL HFA;VENTOLIN HFA) 108 (90 BASE) MCG/ACT inhaler Inhale 2 puffs into the lungs every 4 (four) hours as needed for wheezing or shortness of breath. For shortness of breath or wheezing  1 Inhaler  1  . aspirin EC 81 MG EC tablet Take 1 tablet (81 mg total) by mouth daily.  30 tablet  11  . budesonide-formoterol (SYMBICORT) 160-4.5 MCG/ACT inhaler Inhale 2 puffs into the lungs 2 (two) times  daily. 2 puffs each am and 2 each pm  1 Inhaler  11  . Calcium Carb-Cholecalciferol (CALCIUM-VITAMIN D) 600-400 MG-UNIT TABS Take 1 tablet by mouth every morning.      Marland Kitchen estradiol (ESTRACE) 0.5 MG tablet Take 1 tablet (0.5 mg total) by mouth daily.  30 tablet  1  . ferrous sulfate 325 (65 FE) MG tablet Take 325 mg by mouth every other day.      . fluticasone (FLONASE) 50 MCG/ACT nasal spray Place 2 sprays into both nostrils daily as needed for allergies.       Marland Kitchen gabapentin (NEURONTIN) 300 MG capsule Take 300 mg by mouth at bedtime as needed.      . hydrochlorothiazide (HYDRODIURIL) 12.5 MG tablet Take 1 tablet (12.5 mg total) by mouth daily.  90 tablet  3  . metoprolol tartrate (LOPRESSOR) 25 MG tablet Take 1 tablet (25 mg total) by mouth 2 (two) times daily.  60 tablet  11  . Multiple Vitamin (MULTIVITAMIN) capsule Take 1 capsule by mouth daily.      . nitrofurantoin, macrocrystal-monohydrate, (MACROBID) 100 MG capsule Take 1 capsule (100 mg total) by mouth 2 (two) times daily.  14 capsule  0  . omeprazole (PRILOSEC) 20 MG capsule Take 1 capsule (  20 mg total) by mouth daily. Take 30- 60 min before your first and last meals of the day  60 capsule  11   No current facility-administered medications on file prior to visit.    ROS: Per HPI.  All other systems reviewed and are negative.   Physical Exam Filed Vitals:   07/09/14 1558  BP: 139/84  Pulse: 69    Physical Examination: General appearance - alert, well appearing, and in no distress Chest - clear to auscultation, no wheezes, rales or rhonchi, symmetric air entry Heart - normal rate and regular rhythm, no murmurs noted

## 2014-07-09 NOTE — Patient Instructions (Signed)
Ms. Brymer, as always a pleasure to see you.  Please continue on the estrogen 0.5 mg daily.  We will see you back in about 3 months.  Thanks, Dr. Awanda Mink

## 2014-07-13 ENCOUNTER — Telehealth: Payer: Self-pay | Admitting: Internal Medicine

## 2014-07-13 NOTE — Telephone Encounter (Signed)
Called and spoke to pt. Informed pt that samples will be left up front for pick up. Pt verbalized understanding and denied any further questions or concerns at this time.

## 2014-07-26 ENCOUNTER — Other Ambulatory Visit: Payer: Self-pay | Admitting: Family Medicine

## 2014-08-10 ENCOUNTER — Telehealth: Payer: Self-pay | Admitting: Internal Medicine

## 2014-08-10 NOTE — Telephone Encounter (Signed)
I left 1 sample up front for pick up  Pt aware  ROV scheduled

## 2014-08-21 ENCOUNTER — Encounter: Payer: Self-pay | Admitting: Internal Medicine

## 2014-08-21 ENCOUNTER — Ambulatory Visit (INDEPENDENT_AMBULATORY_CARE_PROVIDER_SITE_OTHER): Payer: BC Managed Care – PPO | Admitting: Internal Medicine

## 2014-08-21 VITALS — BP 150/80 | HR 82 | Temp 98.6°F | Ht 64.0 in | Wt 183.6 lb

## 2014-08-21 DIAGNOSIS — J449 Chronic obstructive pulmonary disease, unspecified: Secondary | ICD-10-CM

## 2014-08-21 MED ORDER — FLUTICASONE PROPIONATE 50 MCG/ACT NA SUSP: 2.0000 | Freq: Every day | NASAL | Status: DC | PRN

## 2014-08-21 NOTE — Assessment & Plan Note (Addendum)
-   PFT's 06/26/08 FEV1 1.09 ratio  - PFTs 03/02/10 FEV1 1.06 ratio 34    PFT's 08/01/2011 FEV1 1.6 (42%)  41 ratio  DLCO 55 corrects 77 - Spiriva trial March 02, 2010 > ? better but not worse off it 04/2011  DDX of  difficult airways management all start with A and  include Adherence, Ace Inhibitors, Acid Reflux, Active Sinus Disease, Alpha 1 Antitripsin deficiency, Anxiety masquerading as Airways dz,  ABPA,  allergy(esp in young), Aspiration (esp in elderly), Adverse effects of DPI,  Active smokers, plus two Bs  = Bronchiectasis and Beta blocker use..and one C= CHF  Adherence is always the initial "prime suspect" and is a multilayered concern that requires a "trust but verify" approach in every patient - starting with knowing how to use medications, especially inhalers, correctly, keeping up with refills and understanding the fundamental difference between maintenance and prns vs those medications only taken for a very short course and then stopped and not refilled.  - no med calendar> needs new if wants to get the most out of outpt care thru this clinic - The proper method of use, as well as anticipated side effects, of a metered-dose inhaler are discussed and demonstrated to the patient. Improved effectiveness after extensive coaching during this visit to a level of approximately  90% from a baseline of < 25%   ? Anxiety > usually dx of exclusion but higher in this case    Each maintenance medication was reviewed in detail including most importantly the difference between maintenance and as needed and under what circumstances the prns are to be used.  Please see instructions for details which were reviewed in writing and the patient given a copy.

## 2014-08-21 NOTE — Patient Instructions (Addendum)
Please see patient coordinator before you leave today  to schedule pulmonary rehab  Work on maintaining optimal inhaler technique:  relax and gently blow all the way out then take a nice smooth deep breath back in, triggering the inhaler at same time you start breathing in.  Hold for up to 5 seconds if you can.  Rinse and gargle with water when done  See Tammy NP in 3 months with all your medications, even over the counter meds, separated in two separate bags, the ones you take no matter what vs the ones you stop once you feel better and take only as needed when you feel you need them.   Tammy  will generate for you a new user friendly medication calendar that will put [place] all on the same page re: your medication use.     Without this process, it simply isn't possible to assure that we are providing  your outpatient care  with  the attention to detail we feel you deserve.   If we cannot assure that you're getting that kind of care,  then we cannot manage your problem effectively from this clinic.  Once you have seen Tammy and we are sure that we're all on the same page with your medication use she will arrange follow up with me.

## 2014-08-21 NOTE — Progress Notes (Signed)
Subjective:    Patient ID: [name], female   DOB: April 22, 1961    MRN: [id number]    Brief patient profile:  1 yobf quit smoking 10/2004 dx by transbronchial biopsy 5/92 with NCG inflammatory change consistent with sarcoid. Since that time she's been off and on prednisone multiple  times with symptoms of coughing dyspnea and skin involvement > weaned off chronic prednisone Feb 2012.   Overall she's been doing much better since she stopped smoking 10/2004 and much better since change to Symbicort 160/4.5 2 two times a day, able to do adl's ok without limitations including some steps. Documented GOLD III copd  07/2011     History of Present Illness   11/28/2012 f/u ov/Wert cc Worse sob  x 3 days with wheezing and cough-occ prod with yellow sputum assoc with nasal congestion off flonase cause can't afford it, filing for disability.  rec Whenever you start having problems with your breathing/coughing/wheezing go ahead and increase the prilosec to Take 30- 60 min before your first and last meals of the day  Prednisone 10 mg take  4 each am x 2 days,   2 each am x 2 days,  1 each am x2days and stop  For cough use mucinex  Or mucinex dm Please see patient coordinator before you leave today for [place] assistance for Symbicort 160   01/29/2013 f/u ov/Wert cc nasal congestion much better p prednisone and then worse off it with cp > neg cardiac w/u, did not activate the action plan prev outlined rec  Whenever you start having problems with your breathing/coughing/wheezing/sinus/ ears/ chest pain go ahead and increase the prilosec to Take 30- 60 min before your first and last meals of the day  [email or web address] is your source for generic medications, cash only though(no insurance) Prednisone 10 mg take  4 each am x 2 days,   2 each am x 2 days,  1 each am x2days and stop  For cough use mucinex  Or mucinex dm I emphasized that nasal steroids have no immediate benefit     05/29/2013 f/u ov/Wert  re flare of resp symptoms, again did not active action plan Chief Complaint  Patient presents with  . Acute Visit    Pt c/o prod cough with yellow to clear sputum, increased SOB, wheezing and bilateral ear pain for approx 2 wks.     Sob only with exertion, responsive to saba with increased use over baseline including noct .  Symptoms were acute with good chronic control of symptoms on symbicort 160 2bid >>augmentin and steroid taper   06/16/2013 Follow and med review  Patient returns for a two-week followup and medication review. Reviewed all her medications organized them into a medication calendar with patient education. Patient was given a [employer] payment card to to help with co -payments for her symbicort inhaler.  Patient had a COPD exacerbation. Last visit. Was given Augmentin and a steroid taper. Patient reports that she is feeling much improved with decreased cough, congestion, and wheezing. She denies any hemoptysis, orthopnea, PND, or leg swelling  Med Cal-- pt reports breathing is doing well since last visit, infection sx are gone-- denies any other concerns at this time Rec Follow medication calendar closely and bring to each visit.   12/19/2013 f/u ov/Wert re: copd / Gold III - no med calendar but says she's follows it Chief Complaint  Patient presents with  . Follow-up    Breathing unchnanged since last OV-- c/o: wheezing x1  week   did not increase ppi as rec on action plan on med calendar. Doing fine when she keeps up with refills and adheres to the plan outlined on the med calendar.   >no change rx/ follow med calendar and bring to each ov   04/13/14 Acute OV  Complains of productive cough, congestion and sore throat. Mucus is very thick , can not get up. Mixed clear and yellow. She denies any fever, or hemoptysis, chest pain, orthopnea, PND, or leg swelling. She denies any recent travel or antibiotic.  Remains on Symbicort 2 puffs twice daily. Says that cough is causing her to not  sleep at night. rec Augmentin 875mg  Twice daily  For 7 days  Mucinex DM Twice daily  As needed  Cough/congestion  Hydromet  1 tsp every 4-6 hr As needed  Cough, may make you sleepy    04/23/2014 f/u ov/Wert re: cough since late May 2015  Chief Complaint  Patient presents with  . Follow-up    Pt reports cough only minimally improved since eval here on 04/13/14. Sometimes coughs until develops HA and vomiting.    Worse at hs and in am ,states already taking omeprazole 20 mg bid ac (though very hesitant with answers re meds and does not have med calendar with action plan as rec previously  rx doxy/ pred and "no better" but no purulent sputum at all now/ actually very little mucus production unless vomits from coughng Never took hycodan Not limited by breathing from desired activities   rec Go ahead and take your cough medication to completely suppress your cough Prednisone 10 mg take  4 each am x 2 days,   2 each am x 2 days,  1 each am x 2 days and stop  Add pepcid ac 20 mg at bedtime and chlortrimeton 4 mg (over the counter) x 2 at bedtime until cough is 100% gone x 2 weeks GERD  Please see patient coordinator before you leave today  to schedule sinus CT> not done   I f not better return in 4 weeks with all meds and med calendar   08/21/2014 f/u ov/Wert re:  GOLD III copd / no med calendar  Chief Complaint  Patient presents with  . Follow-up    Pt states having increased DOE just since this am. She was SOB walking from her car to our building today.     did not use saba on day of ov/ saba was clogged up   No obvious day to day or daytime variabilty or assoc cough   cp or chest tightness, subjective wheeze overt sinus or hb symptoms. No unusual exp hx or h/o childhood pna/ asthma or knowledge of premature birth.  Sleeping ok without nocturnal  or early am exacerbation  of respiratory  c/o's or need for noct saba. Also denies any obvious fluctuation of symptoms with weather or  environmental changes or other aggravating or alleviating factors except as outlined above   Current Medications, Allergies, Complete Past Medical History, Past Surgical History, Family History, and Social History were reviewed in [employer] record.  ROS  The following are not active complaints unless bolded sore throat, dysphagia, dental problems, itching, sneezing,  nasal congestion or excess/ purulent secretions, ear ache,   fever, chills, sweats, unintended wt loss, pleuritic or exertional cp, hemoptysis,  orthopnea pnd or leg swelling, presyncope, palpitations, heartburn, abdominal pain, anorexia, nausea, vomiting, diarrhea  or change in bowel or urinary habits, change in stools  or urine, dysuria,hematuria,  rash, arthralgias, visual complaints, headache, numbness weakness or ataxia or problems with walking or coordination,  change in mood/affect or memory.                 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 Unice Cobble  -h/o Daily prednisone since 2007   > off completely Feb 2012 with no problem ? 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 (ICD-280.9)  Health Maintenance.......................................................   [facility]   Family History:  crohn`s in daughter, M-lupus, htn, asthma,  no  Ca, DM, CAD    Social History:   lives with twin children and grandson. single; >20 pack year history, quit in 2005; no EtOH  Laid off  from works for [employer] Sept 2013              Objective:   Physical Exam wt 166 Oct 12, 2009 >170 October 28, 2010 > 169 06/29/2011 >>171 08/24/2011 > 01/15/2012  171 > 04/24/2012 176 > 06/28/2012  175> 08/13/2012 > 08/30/2012  173 > 170 11/28/2012 > 01/29/2013  176 > 174 >173 06/16/2013 > 171 09/18/2013 > 12/19/2013 177 >179 04/13/2014>  04/23/2014 177 > 08/21/2014 184   amb bf nad  HEENT mild turbinate edema. Ears are nl bilaterally. Oropharynx clear  but no excess pnd or cobblestoning. No JVD or cervical adenopathy. Mild accessory muscle hypertrophy. Trachea midline, nl thryroid. Lungs good air movement trace end exp  wheeze.  Regular rate and rhythm without murmur gallop or rub or increase P2 or edema. Abd: no hsm, nl excursion. Ext warm without cyanosis or clubbing.     CXR  04/13/14  Stable bilateral lung scarring. No evidence of acute superimposed  abnormality.  .        Assessment:

## 2014-08-24 ENCOUNTER — Telehealth (HOSPITAL_COMMUNITY): Payer: Self-pay

## 2014-08-24 NOTE — Telephone Encounter (Signed)
Called patient regarding entrance to Pulmonary Rehab.  Patient states that they are interested in attending the program.  [name] is going to verify insurance coverage and follow up.  Patient also states that she is going to talk to her employer regarding FMLA for three months so she can attend rehab because she states that she knows that she really needs it.

## 2014-09-10 ENCOUNTER — Telehealth (HOSPITAL_COMMUNITY): Payer: Self-pay

## 2014-09-10 NOTE — Telephone Encounter (Signed)
I have called and left a message with Lilliona to inquire about participation in Pulmonary Rehab. Will send letter in mail and follow up.

## 2014-09-14 ENCOUNTER — Encounter: Payer: Self-pay | Admitting: Family Medicine

## 2014-09-14 ENCOUNTER — Ambulatory Visit (INDEPENDENT_AMBULATORY_CARE_PROVIDER_SITE_OTHER): Payer: BC Managed Care – PPO | Admitting: Family Medicine

## 2014-09-14 VITALS — BP 152/78 | HR 58 | Ht 64.0 in | Wt 180.0 lb

## 2014-09-14 DIAGNOSIS — M75100 Unspecified rotator cuff tear or rupture of unspecified shoulder, not specified as traumatic: Secondary | ICD-10-CM | POA: Diagnosis not present

## 2014-09-14 DIAGNOSIS — N951 Menopausal and female climacteric states: Secondary | ICD-10-CM | POA: Diagnosis not present

## 2014-09-14 DIAGNOSIS — M7540 Impingement syndrome of unspecified shoulder: Secondary | ICD-10-CM

## 2014-09-14 MED ORDER — ESTRADIOL 0.5 MG PO TABS: 0.5000 mg | ORAL_TABLET | Freq: Every day | ORAL | Status: DC

## 2014-09-14 NOTE — Assessment & Plan Note (Signed)
Doing well, continue HRT, f/u in three months.  Risks of HRT discussed with pt previously and she reiterates understanding of these risks.

## 2014-09-14 NOTE — Assessment & Plan Note (Signed)
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  - Conservative management at this time with ice at night, heat prior to activity, Tylenol for pain (on HRT so avoid NSAIDS), RTC exercises - F/U in 2 months to see how doing.  If minimal improvement at that time, would consider 3 view shoulder x-rays B/L, US of the RTC, and possible subacromial injection - No weakness on testing today making partial RTC tear unlikely at this time

## 2014-09-14 NOTE — Patient Instructions (Signed)
Please perform the exercises for your shoulder at least 2 x per day. Please see [place] back in two months to discuss how your shoulder is doing   Thanks, Dr. Awanda Mink

## 2014-09-14 NOTE — Progress Notes (Signed)
[name] is a 53 y.o. female who presents today for  hot flashes, menopausal.  Menopausal hot flashes - Ongoing for the past yr. Previously on effexor with titration that did not help much for her.  Previously with hysterectomy w/o BSO performed in 2011.  Does not smoke, no CVD, no FHx or personal hx of Breast or ovarian CA, mammogram in August 2015 which was BIRADS 1.  She started estrogen therapy (HRT) in the beginning of august 2015 and medication started to help about 2 weeks into the course.  She has been doing well since that point and denies any further hot flashes, night sweats, or fatigue.    B/L Shoulder Pain - No inciting injury, R hand dominant, ongoing now for about 6 months, described as dull achy pain, worse with overhead movement.  Denies previous injury to either shoulder, has not tried anything for the pain, and denies any paresthesias or weakness in her hand.  No neck pain, no fever, chills, or sweats, no bone pain, denies muscle weakness in the shoulder.   Past Medical History  Diagnosis Date  . Hypertension   . Sarcoidosis STABLE PER CXR JUNE 2013  . Long-term current use of steroids SYMBICORT INHALER  . Allergic rhinitis   . HTN (hypertension)   . Iron deficiency anemia   . 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 Sickle cell trait   . 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 Shortness of breath     History  Smoking status  . Former Smoker -- 1.00 packs/day for 20 years  . Quit date: 11/14/2003  Smokeless tobacco  . Never Used    Family History  Problem Relation Age of Onset  . Hyperlipidemia Mother   . Asthma Mother   . Lupus Mother   . Hypertension Father   . Hyperlipidemia Father   . Hypertension Sister   . Hypertension Brother   . Cancer Neg Hx   . Diabetes Neg Hx   . Coronary artery disease Neg Hx     Current Outpatient Prescriptions on File Prior to Visit  Medication Sig Dispense Refill  . albuterol (PROVENTIL HFA;VENTOLIN HFA) 108 (90 BASE) MCG/ACT inhaler Inhale 2  puffs into the lungs every 4 (four) hours as needed for wheezing or shortness of breath. For shortness of breath or wheezing 1 Inhaler 1  . aspirin EC 81 MG EC tablet Take 1 tablet (81 mg total) by mouth daily. 30 tablet 11  . budesonide-formoterol (SYMBICORT) 160-4.5 MCG/ACT inhaler Inhale 2 puffs into the lungs 2 (two) times daily. 2 puffs each am and 2 each pm 1 Inhaler 11  . Calcium Carb-Cholecalciferol (CALCIUM-VITAMIN D) 600-400 MG-UNIT TABS Take 1 tablet by mouth every morning.    Marland Kitchen estradiol (ESTRACE) 0.5 MG tablet Take 1 tablet (0.5 mg total) by mouth daily. 30 tablet 2  . ferrous sulfate 325 (65 FE) MG tablet Take 325 mg by mouth every other day.    . fluticasone (FLONASE) 50 MCG/ACT nasal spray Place 2 sprays into both nostrils daily as needed for allergies. 16 g 11  . hydrochlorothiazide (HYDRODIURIL) 12.5 MG tablet Take 1 tablet (12.5 mg total) by mouth daily. 90 tablet 3  . metoprolol tartrate (LOPRESSOR) 25 MG tablet TAKE ONE TABLET BY MOUTH TWICE DAILY 60 tablet 2  . Multiple Vitamin (MULTIVITAMIN) capsule Take 1 capsule by mouth daily.    Marland Kitchen omeprazole (PRILOSEC) 20 MG capsule Take 1 capsule (20 mg total) by mouth daily. Take 30- 60 min  before your first and last meals of the day 60 capsule 11   No current facility-administered medications on file prior to visit.    ROS: Per HPI.  All other systems reviewed and are negative.   Physical Exam Filed Vitals:   09/14/14 1614  BP: 152/78  Pulse: 58    Physical Examination: General appearance - alert, well appearing, and in no distress Chest - clear to auscultation, no wheezes, rales or rhonchi, symmetric air entry Heart - normal rate and regular rhythm, no murmurs noted  Shoulder: Inspection reveals no abnormalities, atrophy or asymmetry. Palpation is normal with no tenderness over AC joint or bicipital groove. ROM is full in all planes. Rotator cuff strength normal throughout. + impingement signs with Neer and Hawkin's  tests, and painful arc at 100 degrees  Speeds and Yergason's tests normal. No labral pathology noted with negative Obrien's, negative clunk and good stability. Normal scapular function observed. No drop arm sign. No apprehension sign

## 2014-09-15 ENCOUNTER — Telehealth (HOSPITAL_COMMUNITY): Payer: Self-pay

## 2014-09-15 NOTE — Telephone Encounter (Signed)
Attempted to call patient regarding Pulmonary Rehab.  Message on phone stated that the person was no longer accepting calls.  Will send letter.

## 2014-09-22 ENCOUNTER — Telehealth: Payer: Self-pay | Admitting: Internal Medicine

## 2014-09-22 NOTE — Telephone Encounter (Signed)
No samples of symbicort  I spoke with the pt and made her aware  Nothing further needed

## 2014-09-28 ENCOUNTER — Telehealth (HOSPITAL_COMMUNITY): Payer: Self-pay | Admitting: *Deleted

## 2014-10-26 ENCOUNTER — Telehealth: Payer: Self-pay | Admitting: Internal Medicine

## 2014-10-26 MED ORDER — BUDESONIDE-FORMOTEROL FUMARATE 160-4.5 MCG/ACT IN AERO: 2.0000 | INHALATION_SPRAY | Freq: Two times a day (BID) | RESPIRATORY_TRACT | Status: DC

## 2014-10-26 NOTE — Telephone Encounter (Signed)
Called and spoke with pt and she is aware of samples of the symbicort that has been left up front and she will come by tomorrow to pick up.  Nothing further is needed.

## 2014-10-28 ENCOUNTER — Other Ambulatory Visit: Payer: Self-pay | Admitting: Family Medicine

## 2014-11-13 DIAGNOSIS — Z923 Personal history of irradiation: Secondary | ICD-10-CM

## 2014-11-13 DIAGNOSIS — C50919 Malignant neoplasm of unspecified site of unspecified female breast: Secondary | ICD-10-CM

## 2014-11-13 HISTORY — DX: Personal history of irradiation: Z92.3

## 2014-11-13 HISTORY — PX: BREAST LUMPECTOMY: SHX2

## 2014-11-13 HISTORY — DX: Malignant neoplasm of unspecified site of unspecified female breast: C50.919

## 2014-11-17 ENCOUNTER — Ambulatory Visit (INDEPENDENT_AMBULATORY_CARE_PROVIDER_SITE_OTHER): Payer: BLUE CROSS/BLUE SHIELD | Admitting: Family Medicine

## 2014-11-17 ENCOUNTER — Encounter: Payer: Self-pay | Admitting: Family Medicine

## 2014-11-17 VITALS — BP 149/81 | HR 78 | Temp 98.0°F | Ht 64.0 in | Wt 179.9 lb

## 2014-11-17 DIAGNOSIS — M75100 Unspecified rotator cuff tear or rupture of unspecified shoulder, not specified as traumatic: Secondary | ICD-10-CM | POA: Diagnosis not present

## 2014-11-17 DIAGNOSIS — N951 Menopausal and female climacteric states: Secondary | ICD-10-CM | POA: Diagnosis not present

## 2014-11-17 DIAGNOSIS — D869 Sarcoidosis, unspecified: Secondary | ICD-10-CM

## 2014-11-17 DIAGNOSIS — M7540 Impingement syndrome of unspecified shoulder: Secondary | ICD-10-CM

## 2014-11-17 NOTE — Patient Instructions (Signed)
Ms. Berninger, as always a pleasure to see you.  Please continue on the estrogen 0.5 mg daily.  We will see you back in about 3 months.  We have placed referrals for you for physical and respiratory therapy   Thanks, Dr. Awanda Mink

## 2014-11-17 NOTE — Progress Notes (Signed)
[name] is a 54 y.o. female who presents today for  hot flashes, menopausal.  Menopausal hot flashes - Ongoing for the past yr. Previously on effexor with titration that did not help much for her.  Previously with hysterectomy w/o BSO performed in 2011.  Does not smoke, no CVD, no FHx or personal hx of Breast or ovarian CA, mammogram in August 2015 which was BIRADS 1.  She started estrogen therapy (HRT) in the beginning of august 2015 and medication started to help about 2 weeks into the course.  She has been doing well since that point and denies any further hot flashes, night sweats, or fatigue.    B/L Shoulder Pain - No inciting injury, R hand dominant, ongoing now for about 7-8 months, described as dull achy pain, worse with overhead movement.  Denies previous injury to either shoulder, and denies any paresthesias or weakness in her hand.  No neck pain, no fever, chills, or sweats, no bone pain, denies muscle weakness in the shoulder.  Previously has tried Tylenol for pain, heat/ice to area, which has helped somewhat.  She has not been doing the RTC exercises very much but her pain has improved.    Past Medical History  Diagnosis Date  . Hypertension   . Sarcoidosis STABLE PER CXR JUNE 2013  . Long-term current use of steroids SYMBICORT INHALER  . Allergic rhinitis   . HTN (hypertension)   . Iron deficiency anemia   . 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 Sickle cell trait   . 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 Shortness of breath     History  Smoking status  . Former Smoker -- 1.00 packs/day for 20 years  . Quit date: 11/14/2003  Smokeless tobacco  . Never Used    Family History  Problem Relation Age of Onset  . Hyperlipidemia Mother   . Asthma Mother   . Lupus Mother   . Hypertension Father   . Hyperlipidemia Father   . Hypertension Sister   . Hypertension Brother   . Cancer Neg Hx   . Diabetes Neg Hx   . Coronary artery disease Neg Hx     Current Outpatient Prescriptions on File  Prior to Visit  Medication Sig Dispense Refill  . albuterol (PROVENTIL HFA;VENTOLIN HFA) 108 (90 BASE) MCG/ACT inhaler Inhale 2 puffs into the lungs every 4 (four) hours as needed for wheezing or shortness of breath. For shortness of breath or wheezing 1 Inhaler 1  . aspirin EC 81 MG EC tablet Take 1 tablet (81 mg total) by mouth daily. 30 tablet 11  . budesonide-formoterol (SYMBICORT) 160-4.5 MCG/ACT inhaler Inhale 2 puffs into the lungs 2 (two) times daily. 2 puffs each am and 2 each pm 2 Inhaler 0  . Calcium Carb-Cholecalciferol (CALCIUM-VITAMIN D) 600-400 MG-UNIT TABS Take 1 tablet by mouth every morning.    Marland Kitchen estradiol (ESTRACE) 0.5 MG tablet Take 1 tablet (0.5 mg total) by mouth daily. 30 tablet 2  . ferrous sulfate 325 (65 FE) MG tablet Take 325 mg by mouth every other day.    . fluticasone (FLONASE) 50 MCG/ACT nasal spray Place 2 sprays into both nostrils daily as needed for allergies. 16 g 11  . hydrochlorothiazide (HYDRODIURIL) 12.5 MG tablet Take 1 tablet (12.5 mg total) by mouth daily. 90 tablet 3  . metoprolol tartrate (LOPRESSOR) 25 MG tablet TAKE ONE TABLET BY MOUTH TWICE DAILY 60 tablet 5  . Multiple Vitamin (MULTIVITAMIN) capsule Take 1 capsule by  mouth daily.    Marland Kitchen omeprazole (PRILOSEC) 20 MG capsule Take 1 capsule (20 mg total) by mouth daily. Take 30- 60 min before your first and last meals of the day 60 capsule 11   No current facility-administered medications on file prior to visit.    ROS: Per HPI.  All other systems reviewed and are negative.   Physical Exam Filed Vitals:   11/17/14 1622  BP: 149/81  Pulse: 78  Temp: 98 F (36.7 C)    Physical Examination: General appearance - alert, well appearing, and in no distress Chest - clear to auscultation, no wheezes, rales or rhonchi, symmetric air entry Heart - normal rate and regular rhythm, no murmurs noted  Shoulder: Inspection reveals no abnormalities, atrophy or asymmetry. Palpation is normal with no  tenderness over AC joint or bicipital groove. ROM is full in all planes. Rotator cuff strength normal throughout. + impingement signs with Neer and Hawkin's tests, and painful arc at 100 degrees  Speeds and Yergason's tests normal. No labral pathology noted with negative Obrien's, negative clunk and good stability. Normal scapular function observed. No drop arm sign. No apprehension sign

## 2014-11-17 NOTE — Assessment & Plan Note (Signed)
Doing well with medication and conservative management - Will send to PT for formal RTC exercises - If starts to bug again, would consider 3 view of shoulders along with [place] and possible SA injection

## 2014-11-17 NOTE — Assessment & Plan Note (Signed)
Doing well, continue HRT, f/u in three months.  Risks of HRT discussed with pt previously and she reiterates understanding of these risks.  - At next 1 or 2 visits, would consider starting to try and wean as we have discussed about one year of tx.  Pt is amenable to this.

## 2014-11-27 ENCOUNTER — Ambulatory Visit (INDEPENDENT_AMBULATORY_CARE_PROVIDER_SITE_OTHER): Payer: BLUE CROSS/BLUE SHIELD | Admitting: Adult Health

## 2014-11-27 ENCOUNTER — Encounter: Payer: Self-pay | Admitting: Adult Health

## 2014-11-27 VITALS — BP 120/72 | HR 86 | Temp 98.6°F | Ht 65.0 in | Wt 182.0 lb

## 2014-11-27 DIAGNOSIS — D869 Sarcoidosis, unspecified: Secondary | ICD-10-CM

## 2014-11-27 DIAGNOSIS — J449 Chronic obstructive pulmonary disease, unspecified: Secondary | ICD-10-CM

## 2014-11-27 NOTE — Patient Instructions (Signed)
Continue on current regimen .  Follow up Dr. Wert  In 4 months and As needed   

## 2014-11-27 NOTE — Assessment & Plan Note (Signed)
No flare  Cont to monitor

## 2014-11-27 NOTE — Assessment & Plan Note (Signed)
Compensated without flare   Plan  Continue on current regimen  Follow up Dr. Melvyn Novas  In 4 months and As needed

## 2014-11-27 NOTE — Progress Notes (Signed)
Subjective:    Patient ID: [name], female   DOB: April 22, 1961    MRN: [id number]    Brief patient profile:  1 yobf quit smoking 10/2004 dx by transbronchial biopsy 5/92 with NCG inflammatory change consistent with sarcoid. Since that time she's been off and on prednisone multiple  times with symptoms of coughing dyspnea and skin involvement > weaned off chronic prednisone Feb 2012.   Overall she's been doing much better since she stopped smoking 10/2004 and much better since change to Symbicort 160/4.5 2 two times a day, able to do adl's ok without limitations including some steps. Documented GOLD III copd  07/2011     History of Present Illness   11/28/2012 f/u ov/Wert cc Worse sob  x 3 days with wheezing and cough-occ prod with yellow sputum assoc with nasal congestion off flonase cause can't afford it, filing for disability.  rec Whenever you start having problems with your breathing/coughing/wheezing go ahead and increase the prilosec to Take 30- 60 min before your first and last meals of the day  Prednisone 10 mg take  4 each am x 2 days,   2 each am x 2 days,  1 each am x2days and stop  For cough use mucinex  Or mucinex dm Please see patient coordinator before you leave today for [place] assistance for Symbicort 160   01/29/2013 f/u ov/Wert cc nasal congestion much better p prednisone and then worse off it with cp > neg cardiac w/u, did not activate the action plan prev outlined rec  Whenever you start having problems with your breathing/coughing/wheezing/sinus/ ears/ chest pain go ahead and increase the prilosec to Take 30- 60 min before your first and last meals of the day  [email or web address] is your source for generic medications, cash only though(no insurance) Prednisone 10 mg take  4 each am x 2 days,   2 each am x 2 days,  1 each am x2days and stop  For cough use mucinex  Or mucinex dm I emphasized that nasal steroids have no immediate benefit     05/29/2013 f/u ov/Wert  re flare of resp symptoms, again did not active action plan Chief Complaint  Patient presents with  . Acute Visit    Pt c/o prod cough with yellow to clear sputum, increased SOB, wheezing and bilateral ear pain for approx 2 wks.     Sob only with exertion, responsive to saba with increased use over baseline including noct .  Symptoms were acute with good chronic control of symptoms on symbicort 160 2bid >>augmentin and steroid taper   06/16/2013 Follow and med review  Patient returns for a two-week followup and medication review. Reviewed all her medications organized them into a medication calendar with patient education. Patient was given a [employer] payment card to to help with co -payments for her symbicort inhaler.  Patient had a COPD exacerbation. Last visit. Was given Augmentin and a steroid taper. Patient reports that she is feeling much improved with decreased cough, congestion, and wheezing. She denies any hemoptysis, orthopnea, PND, or leg swelling  Med Cal-- pt reports breathing is doing well since last visit, infection sx are gone-- denies any other concerns at this time Rec Follow medication calendar closely and bring to each visit.   12/19/2013 f/u ov/Wert re: copd / Gold III - no med calendar but says she's follows it Chief Complaint  Patient presents with  . Follow-up    Breathing unchnanged since last OV-- c/o: wheezing x1  week   did not increase ppi as rec on action plan on med calendar. Doing fine when she keeps up with refills and adheres to the plan outlined on the med calendar.   >no change rx/ follow med calendar and bring to each ov   04/13/14 Acute OV  Complains of productive cough, congestion and sore throat. Mucus is very thick , can not get up. Mixed clear and yellow. She denies any fever, or hemoptysis, chest pain, orthopnea, PND, or leg swelling. She denies any recent travel or antibiotic.  Remains on Symbicort 2 puffs twice daily. Says that cough is causing her to not  sleep at night. rec Augmentin 875mg  Twice daily  For 7 days  Mucinex DM Twice daily  As needed  Cough/congestion  Hydromet  1 tsp every 4-6 hr As needed  Cough, may make you sleepy    04/23/2014 f/u ov/Wert re: cough since late May 2015  Chief Complaint  Patient presents with  . Follow-up    Pt reports cough only minimally improved since eval here on 04/13/14. Sometimes coughs until develops HA and vomiting.    Worse at hs and in am ,states already taking omeprazole 20 mg bid ac (though very hesitant with answers re meds and does not have med calendar with action plan as rec previously  rx doxy/ pred and "no better" but no purulent sputum at all now/ actually very little mucus production unless vomits from coughng Never took hycodan Not limited by breathing from desired activities   rec Go ahead and take your cough medication to completely suppress your cough Prednisone 10 mg take  4 each am x 2 days,   2 each am x 2 days,  1 each am x 2 days and stop  Add pepcid ac 20 mg at bedtime and chlortrimeton 4 mg (over the counter) x 2 at bedtime until cough is 100% gone x 2 weeks GERD  Please see patient coordinator before you leave today  to schedule sinus CT> not done   I f not better return in 4 weeks with all meds and med calendar   08/21/2014 f/u ov/Wert re:  GOLD III copd / no med calendar  Chief Complaint  Patient presents with  . Follow-up    Pt states having increased DOE just since this am. She was SOB walking from her car to our building today.     did not use saba on day of ov/ saba was clogged up  >Pulm rehab   11/27/2014 Follow up GOLD III copd/Sarcoid  Patient returns for a three-month follow-up for COPD Overall, she says she's been doing okay. Has noticed that she's been having on and off wheezing with a cold air, but seems to be improving. She remains on Symbicort 2 puffs twice daily No increased use of her rescue inhaler She denies any chest pain, orthopnea, rash  PND or  leg swelling.     Current Medications, Allergies, Complete Past Medical History, Past Surgical History, Family History, and Social History were reviewed in [employer] record.  ROS  The following are not active complaints unless bolded sore throat, dysphagia, dental problems, itching, sneezing,  nasal congestion or excess/ purulent secretions, ear ache,   fever, chills, sweats, unintended wt loss, pleuritic or exertional cp, hemoptysis,  orthopnea pnd or leg swelling, presyncope, palpitations, heartburn, abdominal pain, anorexia, nausea, vomiting, diarrhea  or change in bowel or urinary habits, change in stools or urine, dysuria,hematuria,  rash, arthralgias, visual  complaints, headache, numbness weakness or ataxia or problems with walking or coordination,  change in mood/affect or memory.          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 Unice Cobble  -h/o Daily prednisone since 2007   > off completely Feb 2012 with no problem ? 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 (ICD-280.9)  Health Maintenance.......................................................   [facility]   Family History:  crohn`s in daughter, M-lupus, htn, asthma,  no Ca, DM, CAD    Social History:   lives with twin children and grandson. single;  >20 pack year history, quit in 2005; no EtOH  Laid off  from works for [employer] Sept 2013              Objective:   Physical Exam wt 166 Oct 12, 2009 >170 October 28, 2010 > 169 06/29/2011 >>171 08/24/2011 > 01/15/2012  171 > 04/24/2012 176 > 06/28/2012  175> 08/13/2012 > 08/30/2012  173 > 170 11/28/2012 > 01/29/2013  176 > 174 >173 06/16/2013 > 171 09/18/2013 > 12/19/2013 177 >179 04/13/2014>  04/23/2014 177 > 08/21/2014 184 >182 11/27/2014   amb bf nad  HEENT mild turbinate edema. Ears are nl bilaterally. Oropharynx clear  but no excess pnd or cobblestoning. No JVD or cervical adenopathy. Mild accessory muscle hypertrophy. Trachea midline, nl thryroid. Lungs , Decreased BS in bases , no wheezing   Regular rate and rhythm without murmur gallop or rub or increase P2 or edema. Abd: no hsm, nl excursion. Ext warm without cyanosis or clubbing.   CXR  04/13/14  Stable bilateral lung scarring. No evidence of acute superimposed abnormality.  .        Assessment:

## 2014-12-01 ENCOUNTER — Other Ambulatory Visit: Payer: Self-pay | Admitting: Family Medicine

## 2014-12-01 NOTE — Telephone Encounter (Signed)
Has been out since Jan 11. HCTZ -pharmacy says we havent responded to their requests for refill [facility]

## 2014-12-02 MED ORDER — HYDROCHLOROTHIAZIDE 12.5 MG PO TABS: 12.5000 mg | ORAL_TABLET | Freq: Every day | ORAL | Status: DC

## 2014-12-02 NOTE — Telephone Encounter (Signed)
I have not received any refill request for this patient since 10/2014.  Derl Barrow, RN

## 2014-12-20 ENCOUNTER — Other Ambulatory Visit: Payer: Self-pay | Admitting: Internal Medicine

## 2014-12-31 ENCOUNTER — Encounter: Payer: Self-pay | Admitting: Family Medicine

## 2014-12-31 ENCOUNTER — Ambulatory Visit (INDEPENDENT_AMBULATORY_CARE_PROVIDER_SITE_OTHER): Payer: BLUE CROSS/BLUE SHIELD | Admitting: Family Medicine

## 2014-12-31 VITALS — BP 158/82 | HR 71 | Temp 98.6°F | Ht 64.0 in | Wt 180.6 lb

## 2014-12-31 DIAGNOSIS — J069 Acute upper respiratory infection, unspecified: Secondary | ICD-10-CM | POA: Diagnosis not present

## 2014-12-31 MED ORDER — AZITHROMYCIN 250 MG PO TABS: ORAL_TABLET | ORAL | Status: DC

## 2014-12-31 NOTE — Patient Instructions (Signed)
This all appears to be viral in nature as your exam was unremarkable.  Continue the flonase/ibuprofen/tylenol.  If you fail to improve fill the antibiotic.  Take care  Dr. Lacinda Axon

## 2014-12-31 NOTE — Progress Notes (Signed)
   Subjective:    Patient ID: [name], female    DOB: December 10, 1960, 54 y.o.   MRN: [id number]  HPI 54 year old female with COPD and sarcoidosis presents for same day appointment with complaints of URI symptoms.  1) URI  Patient reports that she has had a one-week history of bilateral otalgia, cough, and sore throat.  Patient reports a cough is mildly productive of green sputum.  Patient reports a sore throat worsens with swallowing.  She states she's been using Flonase as well as ibuprofen and Tylenol for her symptoms.  She's had mild improvement with these treatments.  She also notes mild increased shortness of breath.  She been using albuterol 1 time daily.  No reported fevers, chills.  No reports of nausea, vomiting.  Patient does note sick contacts at work.  Review of Systems Per HPI    Objective:   Physical Exam Filed Vitals:   12/31/14 1530  BP: 158/82  Pulse: 71  Temp: 98.6 F (37 C)   Exam: General: appears fatigued; NAD.  HEENT: NCAT. Normal TMs bilaterally. Oropharynx mildly erythematous. Cardiovascular: RRR. No murmurs, rubs, or gallops. Respiratory: Decreased air movement. No adventitious sounds appreciated. Abdomen: soft, nontender, nondistended.    Assessment & Plan:  See problem list.

## 2014-12-31 NOTE — Assessment & Plan Note (Signed)
Likely viral in nature. Advised continued use of Flonase, PRN Tylenol/Motrin. Prescription for azithromycin given to be filled only if she fails to improve over the next few days.

## 2015-01-08 ENCOUNTER — Other Ambulatory Visit: Payer: Self-pay | Admitting: Internal Medicine

## 2015-01-11 ENCOUNTER — Other Ambulatory Visit: Payer: Self-pay | Admitting: Family Medicine

## 2015-01-11 NOTE — Telephone Encounter (Signed)
Needs refills on estradiol

## 2015-01-12 MED ORDER — ESTRADIOL 0.5 MG PO TABS: 0.5000 mg | ORAL_TABLET | Freq: Every day | ORAL | Status: DC

## 2015-01-22 ENCOUNTER — Other Ambulatory Visit: Payer: Self-pay | Admitting: Internal Medicine

## 2015-01-25 ENCOUNTER — Telehealth: Payer: Self-pay | Admitting: Internal Medicine

## 2015-01-25 NOTE — Telephone Encounter (Signed)
atc pt, vm full.  wcb

## 2015-01-26 NOTE — Telephone Encounter (Signed)
Called pt and went straight to VM. Mailbox full and not able to accept messages. WCB

## 2015-01-27 NOTE — Telephone Encounter (Signed)
atc pt, went straight to vm and mailbox full.  Will close per triage protocol.

## 2015-02-04 ENCOUNTER — Encounter: Payer: Self-pay | Admitting: Family Medicine

## 2015-02-04 ENCOUNTER — Ambulatory Visit (INDEPENDENT_AMBULATORY_CARE_PROVIDER_SITE_OTHER): Payer: BLUE CROSS/BLUE SHIELD | Admitting: Family Medicine

## 2015-02-04 VITALS — BP 168/85 | HR 72 | Temp 98.0°F | Ht 64.0 in | Wt 178.5 lb

## 2015-02-04 DIAGNOSIS — E785 Hyperlipidemia, unspecified: Secondary | ICD-10-CM | POA: Diagnosis not present

## 2015-02-04 DIAGNOSIS — N951 Menopausal and female climacteric states: Secondary | ICD-10-CM | POA: Diagnosis not present

## 2015-02-04 DIAGNOSIS — I10 Essential (primary) hypertension: Secondary | ICD-10-CM

## 2015-02-04 DIAGNOSIS — J309 Allergic rhinitis, unspecified: Secondary | ICD-10-CM | POA: Diagnosis not present

## 2015-02-04 LAB — COMPREHENSIVE METABOLIC PANEL
ALT: 8 U/L (ref 0–35)
AST: 15 U/L (ref 0–37)
Albumin: 3.9 g/dL (ref 3.5–5.2)
Alkaline Phosphatase: 66 U/L (ref 39–117)
BUN: 9 mg/dL (ref 6–23)
CHLORIDE: 99 meq/L (ref 96–112)
CO2: 34 mEq/L — ABNORMAL HIGH (ref 19–32)
Calcium: 9 mg/dL (ref 8.4–10.5)
Creat: 0.64 mg/dL (ref 0.50–1.10)
Glucose, Bld: 87 mg/dL (ref 70–99)
Potassium: 3.5 mEq/L (ref 3.5–5.3)
Sodium: 140 mEq/L (ref 135–145)
Total Bilirubin: 0.4 mg/dL (ref 0.2–1.2)
Total Protein: 7.1 g/dL (ref 6.0–8.3)

## 2015-02-04 LAB — LIPID PANEL
CHOL/HDL RATIO: 4.1 ratio
Cholesterol: 178 mg/dL (ref 0–200)
HDL: 43 mg/dL — AB (ref >=46)
LDL Cholesterol: 118 mg/dL — ABNORMAL HIGH (ref 0–99)
TRIGLYCERIDES: 84 mg/dL (ref <150)
VLDL: 17 mg/dL (ref 0–40)

## 2015-02-04 LAB — CBC
HCT: 37.9 % (ref 36.0–46.0)
Hemoglobin: 12.5 g/dL (ref 12.0–15.0)
MCH: 25.6 pg — AB (ref 26.0–34.0)
MCHC: 33 g/dL (ref 30.0–36.0)
MCV: 77.5 fL — ABNORMAL LOW (ref 78.0–100.0)
MPV: 9.3 fL (ref 8.6–12.4)
PLATELETS: 337 10*3/uL (ref 150–400)
RBC: 4.89 MIL/uL (ref 3.87–5.11)
RDW: 15.5 % (ref 11.5–15.5)
WBC: 6.5 10*3/uL (ref 4.0–10.5)

## 2015-02-04 LAB — POCT GLYCOSYLATED HEMOGLOBIN (HGB A1C): HEMOGLOBIN A1C: 5.9

## 2015-02-04 MED ORDER — FLUTICASONE PROPIONATE 50 MCG/ACT NA SUSP: 2.0000 | Freq: Every day | NASAL | Status: DC | PRN

## 2015-02-04 NOTE — Assessment & Plan Note (Signed)
Doing well, continue HRT, f/u in three months.  Risks of HRT discussed with pt previously and she reiterates understanding of these risks.  - At next visit, discussed weaning to 0.25 mg (1/2 tablet) per day.  Pt is amenable to this.

## 2015-02-04 NOTE — Progress Notes (Signed)
[name] is a 54 y.o. female who presents today for  hot flashes, menopausal/HTN/Physical   Menopausal hot flashes - Ongoing for the past yr. Previously on effexor with titration that did not help much for her.  Previously with hysterectomy w/o BSO performed in 2011.  Does not smoke, no CVD, no FHx or personal hx of Breast or ovarian CA, mammogram in August 2015 which was BIRADS 1.  She started estrogen therapy (HRT) in the beginning of august 2015 and medication started to help about 2 weeks into the course.  She has been doing well since that point and denies any further hot flashes, night sweats, or fatigue.    HTN - Compliant with metoprolol, HCTZ.  Denies palpitations, cough, weakness, edema, HA.    General Physical - Last Mammogram was august 2015, normal per her report.  Colonoscopy at age 53, no polyps/adenomas.  Did receive influenza shot this year.   Past Medical History  Diagnosis Date  . Hypertension   . Sarcoidosis STABLE PER CXR JUNE 2013  . Long-term current use of steroids SYMBICORT INHALER  . Allergic rhinitis   . HTN (hypertension)   . Iron deficiency anemia   . 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 Sickle cell trait   . 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 Shortness of breath     History  Smoking status  . Former Smoker -- 1.00 packs/day for 20 years  . Quit date: 11/14/2003  Smokeless tobacco  . Never Used    Family History  Problem Relation Age of Onset  . Hyperlipidemia Mother   . Asthma Mother   . Lupus Mother   . Hypertension Father   . Hyperlipidemia Father   . Hypertension Sister   . Hypertension Brother   . Cancer Neg Hx   . Diabetes Neg Hx   . Coronary artery disease Neg Hx     Current Outpatient Prescriptions on File Prior to Visit  Medication Sig Dispense Refill  . albuterol (PROVENTIL HFA;VENTOLIN HFA) 108 (90 BASE) MCG/ACT inhaler Inhale 2 puffs into the lungs every 4 (four) hours as needed for wheezing or shortness of breath. For shortness of breath or  wheezing 1 Inhaler 1  . aspirin EC 81 MG EC tablet Take 1 tablet (81 mg total) by mouth daily. 30 tablet 11  . azithromycin (ZITHROMAX) 250 MG tablet Take 2 tablets on day one then 1 tablet daily on days 2-5. 6 tablet 0  . budesonide-formoterol (SYMBICORT) 160-4.5 MCG/ACT inhaler Inhale 2 puffs into the lungs 2 (two) times daily. 2 puffs each am and 2 each pm 2 Inhaler 0  . Calcium Carb-Cholecalciferol (CALCIUM-VITAMIN D) 600-400 MG-UNIT TABS Take 1 tablet by mouth every morning.    Marland Kitchen estradiol (ESTRACE) 0.5 MG tablet Take 1 tablet (0.5 mg total) by mouth daily. 30 tablet 2  . fluticasone (FLONASE) 50 MCG/ACT nasal spray Place 2 sprays into both nostrils daily as needed for allergies. 16 g 11  . hydrochlorothiazide (HYDRODIURIL) 12.5 MG tablet Take 1 tablet (12.5 mg total) by mouth daily. 90 tablet 3  . metoprolol tartrate (LOPRESSOR) 25 MG tablet TAKE ONE TABLET BY MOUTH TWICE DAILY 60 tablet 5  . Multiple Vitamin (MULTIVITAMIN) capsule Take 1 capsule by mouth daily.    Marland Kitchen omeprazole (PRILOSEC) 20 MG capsule TAKE ONE CAPSULE BY MOUTH ONCE DAILY. TAKE CAPSULE 30- 60 MINUTES BEFORE YOUR FIRST AND LAST  MEALS OF THE DAY 60 capsule 3  . SYMBICORT 160-4.5 MCG/ACT inhaler INHALE  TWO PUFFS BY MOUTH TWICE DAILY IN THE MORNING AND EVENING 1 Inhaler 3   No current facility-administered medications on file prior to visit.    ROS: Per HPI.  All other systems reviewed and are negative.   Physical Exam Filed Vitals:   02/04/15 1522  BP: 168/85  Pulse: 72  Temp: 98 F (36.7 C)   Repeat BP L arm, seated, manual adult cuff: 138/70   Physical Examination: General appearance - alert, well appearing, and in no distress Chest - clear to auscultation, no wheezes, rales or rhonchi, symmetric air entry Heart - normal rate and regular rhythm, no murmurs noted

## 2015-02-04 NOTE — Assessment & Plan Note (Signed)
Check Lipid and A1C today

## 2015-02-04 NOTE — Assessment & Plan Note (Signed)
Well controlled today on repeat - Continue current regimen - Obtain CMET, Lipid, CBC, M:C (future), A1C

## 2015-02-04 NOTE — Addendum Note (Signed)
Addended by: Nolon Rod on: 02/04/2015 04:06 PM   Modules accepted: Orders

## 2015-02-08 ENCOUNTER — Telehealth: Payer: Self-pay | Admitting: Family Medicine

## 2015-02-08 ENCOUNTER — Encounter: Payer: Self-pay | Admitting: Family Medicine

## 2015-02-08 DIAGNOSIS — E785 Hyperlipidemia, unspecified: Secondary | ICD-10-CM

## 2015-02-08 NOTE — Telephone Encounter (Signed)
Tried calling pt with her results.  LVM to call us back when she gets that.  Please let her know everything looked good except her bad cholesterol was slightly elevated and her good cholesterol was slightly low.  Recommend going on low dose statin in near future to help with this.  Tamela Oddi Awanda Mink, DO of [facility] 02/08/2015, 9:24 AM

## 2015-02-11 NOTE — Telephone Encounter (Signed)
Spoke with pt.  She prefers to try diet and exercise first as she does not want to take any additional meds.  Advised I would let MD know and I would send some info on low cholesterol diet. Roni Friberg, Salome Spotted

## 2015-03-11 ENCOUNTER — Telehealth: Payer: Self-pay | Admitting: Pulmonary Disease

## 2015-03-11 NOTE — Telephone Encounter (Signed)
Called spoke with pt. She reports she does not like her ventolin inhaler and every inhaler she gets of ventolin it does not work right. She reports she only uses it PRN but when she does need it, she never can get the medication to come through the mouth piece. She reports it is like this with every ventolin inhaler she gets. She wants to know if [facility] has a sample of a diff rescue inhaler since she just paid $45 for her ventolin? If okay we can give coupon card for proair respicclick? Please advise thanks

## 2015-03-11 NOTE — Telephone Encounter (Signed)
Yes try respiclick but be sure has f/u ov and bring all meds including the ventolin and I will show her how to easily clear it but this happens with all inhalers when they are used as backkups and sit idle a lot

## 2015-03-12 NOTE — Telephone Encounter (Signed)
lmtcb for pt.  Respiclick coupon left up front for pick up. Pt will need to make appt with [facility]. Pt may need demo for respiclick.

## 2015-03-15 NOTE — Telephone Encounter (Signed)
lmomtcb x1 

## 2015-03-15 NOTE — Telephone Encounter (Signed)
LMTCb x 2 

## 2015-03-15 NOTE — Telephone Encounter (Signed)
Pt returned call [phone]

## 2015-03-16 NOTE — Telephone Encounter (Signed)
lmtcb X2 for pt.  

## 2015-03-17 NOTE — Telephone Encounter (Signed)
lmtcb for pt.  

## 2015-04-17 ENCOUNTER — Encounter (HOSPITAL_COMMUNITY): Payer: Self-pay | Admitting: *Deleted

## 2015-04-17 ENCOUNTER — Emergency Department (HOSPITAL_COMMUNITY)
Admission: EM | Admit: 2015-04-17 | Discharge: 2015-04-17 | Disposition: A | Discharge status: Home / Self Care | Payer: BLUE CROSS/BLUE SHIELD | Attending: Emergency Medicine | Admitting: Emergency Medicine

## 2015-04-17 ENCOUNTER — Emergency Department (HOSPITAL_COMMUNITY): Payer: BLUE CROSS/BLUE SHIELD

## 2015-04-17 DIAGNOSIS — N200 Calculus of kidney: Secondary | ICD-10-CM

## 2015-04-17 DIAGNOSIS — I10 Essential (primary) hypertension: Secondary | ICD-10-CM | POA: Diagnosis not present

## 2015-04-17 DIAGNOSIS — Z9889 Other specified postprocedural states: Secondary | ICD-10-CM | POA: Diagnosis not present

## 2015-04-17 DIAGNOSIS — Z7982 Long term (current) use of aspirin: Secondary | ICD-10-CM | POA: Diagnosis not present

## 2015-04-17 DIAGNOSIS — J449 Chronic obstructive pulmonary disease, unspecified: Secondary | ICD-10-CM | POA: Diagnosis not present

## 2015-04-17 DIAGNOSIS — Z862 Personal history of diseases of the blood and blood-forming organs and certain disorders involving the immune mechanism: Secondary | ICD-10-CM | POA: Insufficient documentation

## 2015-04-17 DIAGNOSIS — Z79899 Other long term (current) drug therapy: Secondary | ICD-10-CM | POA: Insufficient documentation

## 2015-04-17 DIAGNOSIS — R109 Unspecified abdominal pain: Secondary | ICD-10-CM

## 2015-04-17 DIAGNOSIS — R1030 Lower abdominal pain, unspecified: Secondary | ICD-10-CM | POA: Diagnosis present

## 2015-04-17 DIAGNOSIS — Z90710 Acquired absence of both cervix and uterus: Secondary | ICD-10-CM | POA: Diagnosis not present

## 2015-04-17 DIAGNOSIS — Z7952 Long term (current) use of systemic steroids: Secondary | ICD-10-CM | POA: Insufficient documentation

## 2015-04-17 DIAGNOSIS — Z87891 Personal history of nicotine dependence: Secondary | ICD-10-CM | POA: Insufficient documentation

## 2015-04-17 LAB — COMPREHENSIVE METABOLIC PANEL
ALT: 13 U/L — ABNORMAL LOW (ref 14–54)
ANION GAP: 6 (ref 5–15)
AST: 18 U/L (ref 15–41)
Albumin: 3.8 g/dL (ref 3.5–5.0)
Alkaline Phosphatase: 73 U/L (ref 38–126)
BILIRUBIN TOTAL: 0.5 mg/dL (ref 0.3–1.2)
BUN: 8 mg/dL (ref 6–20)
CALCIUM: 9.3 mg/dL (ref 8.9–10.3)
CO2: 33 mmol/L — ABNORMAL HIGH (ref 22–32)
CREATININE: 0.77 mg/dL (ref 0.44–1.00)
Chloride: 97 mmol/L — ABNORMAL LOW (ref 101–111)
GFR calc Af Amer: >60 mL/min (ref >60)
GFR calc non Af Amer: >60 mL/min (ref >60)
GLUCOSE: 97 mg/dL (ref 65–99)
POTASSIUM: 3.6 mmol/L (ref 3.5–5.1)
Sodium: 136 mmol/L (ref 135–145)
TOTAL PROTEIN: 7.5 g/dL (ref 6.5–8.1)

## 2015-04-17 LAB — CBC WITH DIFFERENTIAL/PLATELET
BASOS ABS: 0 10*3/uL (ref 0.0–0.1)
Basophils Relative: 0 % (ref 0–1)
Eosinophils Absolute: 0.2 10*3/uL (ref 0.0–0.7)
Eosinophils Relative: 2 % (ref 0–5)
HCT: 36.9 % (ref 36.0–46.0)
Hemoglobin: 12.3 g/dL (ref 12.0–15.0)
Lymphocytes Relative: 16 % (ref 12–46)
Lymphs Abs: 1.2 10*3/uL (ref 0.7–4.0)
MCH: 26.1 pg (ref 26.0–34.0)
MCHC: 33.3 g/dL (ref 30.0–36.0)
MCV: 78.2 fL (ref 78.0–100.0)
MONO ABS: 0.6 10*3/uL (ref 0.1–1.0)
MONOS PCT: 8 % (ref 3–12)
Neutro Abs: 5.3 10*3/uL (ref 1.7–7.7)
Neutrophils Relative %: 74 % (ref 43–77)
Platelets: 316 10*3/uL (ref 150–400)
RBC: 4.72 MIL/uL (ref 3.87–5.11)
RDW: 15.2 % (ref 11.5–15.5)
WBC: 7.2 10*3/uL (ref 4.0–10.5)

## 2015-04-17 LAB — URINE MICROSCOPIC-ADD ON

## 2015-04-17 LAB — URINALYSIS, ROUTINE W REFLEX MICROSCOPIC
Bilirubin Urine: NEGATIVE
Glucose, UA: NEGATIVE mg/dL
Ketones, ur: NEGATIVE mg/dL
NITRITE: NEGATIVE
PH: 7 (ref 5.0–8.0)
Protein, ur: NEGATIVE mg/dL
SPECIFIC GRAVITY, URINE: 1.012 (ref 1.005–1.030)
Urobilinogen, UA: 0.2 mg/dL (ref 0.0–1.0)

## 2015-04-17 LAB — LIPASE, BLOOD: Lipase: 29 U/L (ref 22–51)

## 2015-04-17 MED ORDER — ONDANSETRON 4 MG PO TBDP
ORAL_TABLET | ORAL | Status: AC
  Filled 2015-04-17: qty 2

## 2015-04-17 MED ORDER — ONDANSETRON 4 MG PO TBDP
8.0000 mg | ORAL_TABLET | Freq: Once | ORAL | Status: AC
  Administered 2015-04-17: 8 mg via ORAL

## 2015-04-17 MED ORDER — OXYCODONE-ACETAMINOPHEN 5-325 MG PO TABS: 1.0000 | ORAL_TABLET | ORAL | Status: DC | PRN

## 2015-04-17 MED ORDER — KETOROLAC TROMETHAMINE 60 MG/2ML IM SOLN
30.0000 mg | Freq: Once | INTRAMUSCULAR | Status: AC
  Administered 2015-04-17: 30 mg via INTRAMUSCULAR
  Filled 2015-04-17: qty 2

## 2015-04-17 MED ORDER — ONDANSETRON 4 MG PO TBDP: ORAL_TABLET | ORAL | Status: DC

## 2015-04-17 MED ORDER — ALBUTEROL SULFATE HFA 108 (90 BASE) MCG/ACT IN AERS
2.0000 | INHALATION_SPRAY | Freq: Once | RESPIRATORY_TRACT | Status: AC
  Administered 2015-04-17: 2 via RESPIRATORY_TRACT
  Filled 2015-04-17: qty 6.7

## 2015-04-17 NOTE — ED Notes (Signed)
Pt reports onset this am of right flank pain, denies hx of kidney stones. Denies urinary symptoms. Now having nausea.

## 2015-04-17 NOTE — Discharge Instructions (Signed)

## 2015-04-17 NOTE — ED Provider Notes (Signed)
CSN: [id number]     Arrival date & time 04/17/15  1520 History   First MD Initiated Contact with Patient 04/17/15 1646     Chief Complaint  Patient presents with  . Flank Pain     (Consider location/radiation/quality/duration/timing/severity/associated sxs/prior Treatment) Patient is a 54 y.o. female presenting with flank pain.  Flank Pain This is a new problem. The current episode started yesterday. The problem occurs constantly. The problem has not changed since onset.Associated symptoms include abdominal pain (RUQ). Pertinent negatives include no chest pain, no headaches and no shortness of breath. Nothing aggravates the symptoms. Nothing relieves the symptoms.    Past Medical History  Diagnosis Date  . Hypertension   . Sarcoidosis STABLE PER CXR JUNE 2013  . Long-term current use of steroids SYMBICORT INHALER  . Allergic rhinitis   . HTN (hypertension)   . Iron deficiency anemia   . 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 Sickle cell trait   . 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 Shortness of breath    Past Surgical History  Procedure Laterality Date  . Cesarean section  1986    W/ BILATERAL TUBAL LIGATION  . Total robotic assisted laparoscopic hysterectomy  12-30-2010    SYMPTOMATIC UTERINE FIBROIDS  . Upper teeth extraction's  1992  . Median rectus repair  05/22/2012    Procedure: MEDIAN RECTUS REPAIR;  Surgeon: Dara Hoyer, MD;  Location: [facility];  Service: Ophthalmology;  Laterality: Bilateral;  INFERIOR RECTUS RESECTION WITH ADJUSTIBLE SUTURES RIGHT EYE   . Adjustable suture manipulation  05/22/2012    Procedure: ADJUSTABLE SUTURE MANIPULATION;  Surgeon: Dara Hoyer, MD;  Location: [facility];  Service: Ophthalmology;  Laterality: Right;  . Eye surgery     Family History  Problem Relation Age of Onset  . Hyperlipidemia Mother   . Asthma Mother   . Lupus Mother   . Hypertension Father   . Hyperlipidemia Father   . Hypertension Sister   .  Hypertension Brother   . Cancer Neg Hx   . Diabetes Neg Hx   . Coronary artery disease Neg Hx    History  Substance Use Topics  . Smoking status: Former Smoker -- 1.00 packs/day for 20 years    Quit date: 11/14/2003  . Smokeless tobacco: Never Used  . Alcohol Use: No   OB History    No data available     Review of Systems  Respiratory: Negative for shortness of breath.   Cardiovascular: Negative for chest pain.  Gastrointestinal: Positive for abdominal pain (RUQ).  Genitourinary: Positive for flank pain.  Neurological: Negative for headaches.  All other systems reviewed and are negative.     Allergies  Codeine  Home Medications   Prior to Admission medications   Medication Sig Start Date End Date Taking? Authorizing Provider  albuterol (PROVENTIL HFA;VENTOLIN HFA) 108 (90 BASE) MCG/ACT inhaler Inhale 2 puffs into the lungs every 4 (four) hours as needed for wheezing or shortness of breath. For shortness of breath or wheezing 12/19/13  Yes Tanda Rockers, MD  aspirin EC 81 MG EC tablet Take 1 tablet (81 mg total) by mouth daily. 12/21/12  Yes Marin Olp, MD  estradiol (ESTRACE) 0.5 MG tablet Take 1 tablet (0.5 mg total) by mouth daily. 01/12/15  Yes Bryan R Hess, DO  fluticasone (FLONASE) 50 MCG/ACT nasal spray Place 2 sprays into both nostrils daily as needed for allergies. 02/04/15  Yes Nolon Rod, DO  hydrochlorothiazide (HYDRODIURIL) 12.5 MG tablet Take 1 tablet (12.5 mg total) by mouth daily. 12/02/14  Yes Bryan R Hess, DO  metoprolol tartrate (LOPRESSOR) 25 MG tablet TAKE ONE TABLET BY MOUTH TWICE DAILY 10/29/14  Yes Tamela Oddi Hess, DO  Multiple Vitamin (MULTIVITAMIN) capsule Take 1 capsule by mouth daily.   Yes Historical Provider, MD  omeprazole (PRILOSEC) 20 MG capsule TAKE ONE CAPSULE BY MOUTH ONCE DAILY. TAKE CAPSULE 30- 60 MINUTES BEFORE YOUR FIRST AND LAST  MEALS OF THE DAY 01/08/15  Yes Tanda Rockers, MD  SYMBICORT 160-4.5 MCG/ACT inhaler INHALE TWO PUFFS BY MOUTH  TWICE DAILY IN THE MORNING AND EVENING 01/25/15  Yes Tanda Rockers, MD  ondansetron (ZOFRAN ODT) 4 MG disintegrating tablet 4mg  ODT q4 hours prn nausea/vomit 04/17/15   Debby Freiberg, MD  oxyCODONE-acetaminophen (PERCOCET/ROXICET) 5-325 MG per tablet Take 1-2 tablets by mouth every 4 (four) hours as needed for severe pain. 04/17/15   Debby Freiberg, MD   BP 135/69 mmHg  Pulse 66  Temp(Src) 98.2 F (36.8 C) (Oral)  Resp 18  Ht 5\' 5"  (1.651 m)  Wt 174 lb (78.926 kg)  BMI 28.96 kg/m2  SpO2 92% Physical Exam  Constitutional: She is oriented to person, place, and time. She appears well-developed and well-nourished.  HENT:  Head: Normocephalic and atraumatic.  Right Ear: External ear normal.  Left Ear: External ear normal.  Eyes: Conjunctivae and EOM are normal. Pupils are equal, round, and reactive to light.  Neck: Normal range of motion. Neck supple.  Cardiovascular: Normal rate, regular rhythm, normal heart sounds and intact distal pulses.   Pulmonary/Chest: Effort normal and breath sounds normal.  Abdominal: Soft. Bowel sounds are normal. There is tenderness in the right upper quadrant. There is CVA tenderness (R).  Musculoskeletal: Normal range of motion.  Neurological: She is alert and oriented to person, place, and time.  Skin: Skin is warm and dry.  Vitals reviewed.   ED Course  Procedures (including critical care time) Labs Review Labs Reviewed  COMPREHENSIVE METABOLIC PANEL - Abnormal; Notable for the following:    Chloride 97 (*)    CO2 33 (*)    ALT 13 (*)    All other components within normal limits  URINALYSIS, ROUTINE W REFLEX MICROSCOPIC (NOT AT [facility]) - Abnormal; Notable for the following:    Hgb urine dipstick SMALL (*)    Leukocytes, UA TRACE (*)    All other components within normal limits  CBC WITH DIFFERENTIAL/PLATELET  LIPASE, BLOOD  URINE MICROSCOPIC-ADD ON    Imaging Review Ct Renal Stone Study  04/17/2015   CLINICAL DATA:  RIGHT flank and abdominal  pain, nausea, history hypertension, sarcoidosis, COPD, former smoker  EXAM: CT ABDOMEN AND PELVIS WITHOUT CONTRAST  TECHNIQUE: Multidetector CT imaging of the abdomen and pelvis was performed following the standard protocol without IV contrast. Sagittal and coronal MPR images reconstructed from axial data set. Oral contrast not administered for this indication.  COMPARISON:  None ; correlation CT chest 02/06/2011  FINDINGS: Posterior RIGHT diaphragmatic hernia containing fat.  Emphysematous changes with atelectasis and scarring at lung bases.  Area of questionable nodular density likely reflects scarring in LEFT lower lobe, unchanged.  Nonobstructing 5 mm RIGHT renal calculus image 24.  No hydronephrosis, ureteral dilatation or ureteral calcification.  Question slightly nodular hepatic margins raising question of cirrhosis.  Liver, spleen, pancreas, kidneys, and adrenal glands otherwise normal.  Scattered normal to mildly enlarged lymph nodes in upper abdomen including a 13 mm short  axis node peripancreatic image 15, question 13 mm node adjacent to upper pole of RIGHT kidney image 18, and a perigastric node 8 mm short axis image 23.  Observed nodes are of uncertain etiology but majority are stable since previous CT chest exam.  Scattered atherosclerotic calcifications aorta without aneurysm.  Appendix not visualized.  Stomach and bowel loops grossly unremarkable.  No mass, adenopathy, free air, or free fluid.  Uterus surgically absent with nonvisualization of ovaries.  Bones unremarkable.  IMPRESSION: Nonobstructing 5 mm RIGHT renal calculus.  No evidence of obstructing urinary tract calcification or dilatation.  Generally stable peripancreatic and perigastric lymph nodes, uncertain etiology, could potentially be related to patient's history of sarcoidosis.  Question subtle nodularity of hepatic margins, cannot exclude cirrhosis with this appearance.   Electronically Signed   By: Lavonia Dana M.D.   On: 04/17/2015  19:11     EKG Interpretation None      MDM   Final diagnoses:  Abdominal pain    54 y.o. female with pertinent PMH of HTN, sarcoidosis presents with R flank pain, constant, as above since yesterday, worsening today.  On arrival vitals and physical exam as above.    Elba Barman revealed nephrolithiasis.  Pt was given albuterol here for her chronic dyspnea, no acute dyspnea at this time.  DC home with standard medical expulsive therapy.  I have reviewed all laboratory and imaging studies if ordered as above  1. Abdominal pain    2. Nephrolithiasis     Debby Freiberg, MD 04/17/15 [phone]

## 2015-04-17 NOTE — ED Notes (Signed)
Main lab will add on lipase.

## 2015-05-01 ENCOUNTER — Other Ambulatory Visit: Payer: Self-pay | Admitting: Family Medicine

## 2015-05-03 ENCOUNTER — Telehealth: Payer: Self-pay | Admitting: Internal Medicine

## 2015-05-03 NOTE — Telephone Encounter (Signed)
Per 11/27/14 OV w/ MW: Patient Instructions       Continue on current regimen  Follow up Dr. Melvyn Novas  In 4 months and As needed   --   Pt aware 1 sample left for pick up. Nothing further needed

## 2015-05-07 ENCOUNTER — Encounter: Payer: Self-pay | Admitting: Family Medicine

## 2015-05-07 ENCOUNTER — Ambulatory Visit (INDEPENDENT_AMBULATORY_CARE_PROVIDER_SITE_OTHER): Payer: BLUE CROSS/BLUE SHIELD | Admitting: Family Medicine

## 2015-05-07 VITALS — BP 122/66 | HR 77 | Temp 98.1°F | Ht 65.0 in | Wt 174.0 lb

## 2015-05-07 DIAGNOSIS — I10 Essential (primary) hypertension: Secondary | ICD-10-CM | POA: Diagnosis not present

## 2015-05-07 DIAGNOSIS — N951 Menopausal and female climacteric states: Secondary | ICD-10-CM | POA: Diagnosis not present

## 2015-05-07 NOTE — Assessment & Plan Note (Signed)
Well controlled currently, continue regimen

## 2015-05-07 NOTE — Progress Notes (Signed)
[name] is a 54 y.o. female who presents today for hot flashes, menopausal/HTN   Menopausal hot flashes - Ongoing for the past yr. Previously on effexor with titration that did not help much for her.  Previously with hysterectomy w/o BSO performed in 2011.  Does not smoke, no CVD, no FHx or personal hx of Breast or ovarian CA, mammogram in August 2015 which was BIRADS 1.  She started estrogen therapy (HRT) in the beginning of august 2015 and medication started to help about 2 weeks into the course.  She has been doing well since that point and denies any further hot flashes, night sweats, or fatigue.    HTN - Compliant with metoprolol, HCTZ.  Denies palpitations, cough, weakness, edema, HA.     Past Medical History  Diagnosis Date  . Hypertension   . Sarcoidosis STABLE PER CXR JUNE 2013  . Long-term current use of steroids SYMBICORT INHALER  . Allergic rhinitis   . HTN (hypertension)   . Iron deficiency anemia   . 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 Sickle cell trait   . 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 Shortness of breath     History  Smoking status  . Former Smoker -- 1.00 packs/day for 20 years  . Quit date: 11/14/2003  Smokeless tobacco  . Never Used    Family History  Problem Relation Age of Onset  . Hyperlipidemia Mother   . Asthma Mother   . Lupus Mother   . Hypertension Father   . Hyperlipidemia Father   . Hypertension Sister   . Hypertension Brother   . Cancer Neg Hx   . Diabetes Neg Hx   . Coronary artery disease Neg Hx     Current Outpatient Prescriptions on File Prior to Visit  Medication Sig Dispense Refill  . albuterol (PROVENTIL HFA;VENTOLIN HFA) 108 (90 BASE) MCG/ACT inhaler Inhale 2 puffs into the lungs every 4 (four) hours as needed for wheezing or shortness of breath. For shortness of breath or wheezing 1 Inhaler 1  . aspirin EC 81 MG EC tablet Take 1 tablet (81 mg total) by mouth daily. 30 tablet 11  . estradiol (ESTRACE) 0.5 MG tablet TAKE ONE TABLET BY  MOUTH  DAILY 30 tablet 0  . fluticasone (FLONASE) 50 MCG/ACT nasal spray Place 2 sprays into both nostrils daily as needed for allergies. 16 g 11  . hydrochlorothiazide (HYDRODIURIL) 12.5 MG tablet Take 1 tablet (12.5 mg total) by mouth daily. 90 tablet 3  . metoprolol tartrate (LOPRESSOR) 25 MG tablet TAKE ONE TABLET BY MOUTH TWICE DAILY 60 tablet 0  . Multiple Vitamin (MULTIVITAMIN) capsule Take 1 capsule by mouth daily.    Marland Kitchen omeprazole (PRILOSEC) 20 MG capsule TAKE ONE CAPSULE BY MOUTH ONCE DAILY. TAKE CAPSULE 30- 60 MINUTES BEFORE YOUR FIRST AND LAST  MEALS OF THE DAY 60 capsule 3  . ondansetron (ZOFRAN ODT) 4 MG disintegrating tablet 4mg  ODT q4 hours prn nausea/vomit 15 tablet 0  . oxyCODONE-acetaminophen (PERCOCET/ROXICET) 5-325 MG per tablet Take 1-2 tablets by mouth every 4 (four) hours as needed for severe pain. 15 tablet 0  . SYMBICORT 160-4.5 MCG/ACT inhaler INHALE TWO PUFFS BY MOUTH TWICE DAILY IN THE MORNING AND EVENING 1 Inhaler 3   No current facility-administered medications on file prior to visit.    ROS: Per HPI.  All other systems reviewed and are negative.   Physical Exam Filed Vitals:   05/07/15 1613  BP: 122/66  Pulse: 77  Temp: 98.1 F (36.7 C)   Repeat BP L arm, seated, manual adult cuff: 138/70   Physical Examination: General appearance - alert, well appearing, and in no distress Chest - clear to auscultation, no wheezes, rales or rhonchi, symmetric air entry Heart - normal rate and regular rhythm, no murmurs noted

## 2015-05-07 NOTE — Assessment & Plan Note (Signed)
Doing well, continue HRT, f/u in three months.  Risks of HRT discussed with pt previously and she reiterates understanding of these risks.  - F/U in August 2016 (about one year after starting HRT), discussed weaning to 0.25 mg (1/2 tablet) per day at that time with hope of completely coming off of medication in near future.

## 2015-05-12 ENCOUNTER — Encounter: Payer: Self-pay | Admitting: *Deleted

## 2015-05-12 ENCOUNTER — Encounter: Payer: Self-pay | Admitting: Internal Medicine

## 2015-05-12 ENCOUNTER — Ambulatory Visit (INDEPENDENT_AMBULATORY_CARE_PROVIDER_SITE_OTHER): Payer: BLUE CROSS/BLUE SHIELD | Admitting: Internal Medicine

## 2015-05-12 ENCOUNTER — Encounter (INDEPENDENT_AMBULATORY_CARE_PROVIDER_SITE_OTHER): Payer: Self-pay

## 2015-05-12 VITALS — BP 126/74 | HR 80 | Ht 65.0 in | Wt 177.4 lb

## 2015-05-12 DIAGNOSIS — J449 Chronic obstructive pulmonary disease, unspecified: Secondary | ICD-10-CM | POA: Diagnosis not present

## 2015-05-12 DIAGNOSIS — D869 Sarcoidosis, unspecified: Secondary | ICD-10-CM

## 2015-05-12 NOTE — Patient Instructions (Addendum)
No change in medications  Try to work up to a minimum of 30 minutes without stopping 3 x weekly on your bike   If you are satisfied with your treatment plan,  let your doctor know and he/she can either refill your medications or you can return here when your prescription runs out.     If in any way you are not 100% satisfied,  please tell us.  If 100% better, tell your friends!  Pulmonary follow up is as needed   - good luck!!!

## 2015-05-12 NOTE — Progress Notes (Signed)
Subjective:    Patient ID: [name], female   DOB: 04-Mar-1961    MRN: [id number]    Brief patient profile:  80 yobf quit smoking 10/2004 dx by transbronchial biopsy 5/92 with NCG inflammatory change consistent with sarcoid. Since that time she's been off and on prednisone multiple  times with symptoms of coughing dyspnea and skin involvement > weaned off chronic prednisone Feb 2012.   Overall she's been doing much better since she stopped smoking 10/2004 and much better since change to Symbicort 160/4.5 2 two times a day, able to do adl's ok without limitations including some steps. Documented GOLD III copd  07/2011     History of Present Illness 04/23/2014 f/u ov/[name] re: cough since late May 2015  Chief Complaint  Patient presents with  . Follow-up    Pt reports cough only minimally improved since eval here on 04/13/14. Sometimes coughs until develops HA and vomiting.    Worse at hs and in am ,states already taking omeprazole 20 mg bid ac (though very hesitant with answers re meds and does not have med calendar with action plan as rec previously  rx doxy/ pred and "no better" but no purulent sputum at all now/ actually very little mucus production unless vomits from coughng Never took hycodan Not limited by breathing from desired activities   rec Go ahead and take your cough medication to completely suppress your cough Prednisone 10 mg take  4 each am x 2 days,   2 each am x 2 days,  1 each am x 2 days and stop  Add pepcid ac 20 mg at bedtime and chlortrimeton 4 mg (over the counter) x 2 at bedtime until cough is 100% gone x 2 weeks GERD  Please see patient coordinator before you leave today  to schedule sinus CT> not done   I f not better return in 4 weeks with all meds and med calendar   08/21/2014 f/u ov/[name] re:  GOLD III copd / no med calendar  Chief Complaint  Patient presents with  . Follow-up    Pt states having increased DOE just since this am. She was SOB walking from her  car to our building today.     did not use saba on day of ov/ saba was clogged up  > rec Pulm rehab   11/27/2014 NP  Follow up GOLD III copd/Sarcoid  Patient returns for a three-month follow-up for COPD Overall, she says she's been doing okay. Has noticed that she's been having on and off wheezing with cold air, but seems to be improving. She remains on Symbicort 2 puffs twice daily No increased use of her rescue inhaler rec No change rx   05/12/2015 f/u ov/[name] re: copd III / symbicort 160 2bid  Chief Complaint  Patient presents with  . Follow-up    Pt states her breathing is doing well overall. She has not needed her rescue inhaler.     Not limited by breathing from desired activities    No obvious day to day or daytime variabilty or assoc chronic cough or cp or chest tightness, subjective wheeze overt sinus or hb symptoms. No unusual exp hx or h/o childhood pna/ asthma or knowledge of premature birth.  Sleeping ok without nocturnal  or early am exacerbation  of respiratory  c/o's or need for noct saba. Also denies any obvious fluctuation of symptoms with weather or environmental changes or other aggravating or alleviating factors except as outlined above  Current Medications, Allergies, Complete Past Medical History, Past Surgical History, Family History, and Social History were reviewed in [employer] record.  ROS  The following are not active complaints unless bolded sore throat, dysphagia, dental problems, itching, sneezing,  nasal congestion or excess/ purulent secretions, ear ache,   fever, chills, sweats, unintended wt loss, pleuritic or exertional cp, hemoptysis,  orthopnea pnd or leg swelling, presyncope, palpitations, abdominal pain, anorexia, nausea, vomiting, diarrhea  or change in bowel or urinary habits, change in stools or urine, dysuria,hematuria,  rash, arthralgias, visual complaints, headache, numbness weakness or ataxia or problems with walking  or coordination,  change in mood/affect or memory.                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 Unice Cobble  -h/o Daily prednisone since 2007   > off completely Feb 2012 with no problem ? 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 (ICD-280.9)  Health Maintenance.......................................................   [facility]   Family History:  crohn`s in daughter, M-lupus, htn, asthma,  no Ca, DM, CAD    Social History:   lives with twin children and grandson. single; >20 pack year history, quit in 2005; no EtOH  Laid off  from works for [employer] Sept 2013              Objective:   Physical Exam wt 166 Oct 12, 2009 >170 October 28, 2010 > 169 06/29/2011 >>171 08/24/2011 > 01/15/2012  171 > 04/24/2012 176 > 06/28/2012  175> 08/13/2012 > 08/30/2012  173 > 170 11/28/2012 > 01/29/2013  176 > 174 >173 06/16/2013 > 171 09/18/2013 > 12/19/2013 177 >179 04/13/2014>  04/23/2014 177 > 08/21/2014 184 >182 11/27/2014 > 05/12/2015 177   amb bf nad   HEENT: nl dentition, turbinates, and orophanx. Nl external ear canals without cough reflex   NECK :  without JVD/Nodes/TM/ nl carotid upstrokes bilaterally   LUNGS: no acc muscle use, clear to A and P bilaterally without cough on insp or exp maneuvers   CV:  RRR  no s3 or  murmur or increase in P2, no edema   ABD:  soft and nontender with nl excursion in the supine position. No bruits or organomegaly, bowel sounds nl  MS:  warm without deformities, calf tenderness, cyanosis or clubbing  SKIN: warm and dry without lesions    NEURO:  alert, approp, no deficits       .        Assessment:    Outpatient Encounter Prescriptions as of 05/12/2015  Medication Sig  . albuterol (PROVENTIL HFA;VENTOLIN HFA) 108 (90 BASE) MCG/ACT inhaler Inhale 2 puffs into the lungs every 4 (four) hours as needed for wheezing or shortness of breath. For shortness of breath or wheezing  . aspirin EC 81 MG EC tablet Take 1 tablet (81 mg total) by  mouth daily.  Marland Kitchen estradiol (ESTRACE) 0.5 MG tablet TAKE ONE TABLET BY MOUTH  DAILY  . fluticasone (FLONASE) 50 MCG/ACT nasal spray Place 2 sprays into both nostrils daily as needed for allergies.  . hydrochlorothiazide (HYDRODIURIL) 12.5 MG tablet Take 1 tablet (12.5 mg total) by mouth daily.  . metoprolol tartrate (LOPRESSOR) 25 MG tablet TAKE ONE TABLET BY MOUTH TWICE DAILY  . Multiple Vitamin (MULTIVITAMIN) capsule Take 1 capsule by mouth daily.  Marland Kitchen omeprazole (PRILOSEC) 20 MG capsule TAKE ONE CAPSULE BY MOUTH ONCE DAILY. TAKE CAPSULE 30- 60 MINUTES BEFORE YOUR FIRST AND LAST  MEALS OF THE DAY  . SYMBICORT 160-4.5 MCG/ACT inhaler INHALE TWO PUFFS BY MOUTH TWICE DAILY IN THE MORNING AND EVENING  . [DISCONTINUED] ondansetron (ZOFRAN ODT) 4 MG disintegrating tablet 4mg  ODT q4 hours prn nausea/vomit (Patient not taking: Reported on 05/12/2015)  . [DISCONTINUED] oxyCODONE-acetaminophen (PERCOCET/ROXICET) 5-325 MG per tablet Take 1-2 tablets by mouth every 4 (four) hours as needed for severe pain. (Patient not taking: Reported on 05/12/2015)   No facility-administered encounter medications on file as of 05/12/2015.

## 2015-05-13 ENCOUNTER — Encounter: Payer: Self-pay | Admitting: Internal Medicine

## 2015-05-13 NOTE — Assessment & Plan Note (Signed)
Positive transbronchial biopsy 02/19/91 by Dr. Unice Cobble  -Daily prednisone since 2007 with ? adrenal insufficiency iatrogenic > off completely Feb 2012 with no recurrence  No need for further pulmonary clinic f/u

## 2015-05-13 NOTE — Assessment & Plan Note (Addendum)
Quit smoking 2005  - PFT's 06/26/08 FEV1 1.09 ratio  - PFTs 03/02/10 FEV1 1.06 ratio 34    PFT's 08/01/2011 FEV1 1.6 (42%)  41 ratio  DLCO 55 corrects 77 - Spiriva trial March 02, 2010 > ? better but not worse off it 04/2011 - 05/12/2015 p extensive coaching HFA effectiveness =    90%  - Referred to Rehab 08/21/2014 > could not arrange due to schedule  -med calendar 06/16/2013 > did not bring to office as requested 12/19/13 or 04/23/14  Or 08/21/2014    I had an extended final summary discussion with the patient reviewing all relevant studies completed to date and  lasting 15 to 20 minutes of a 25 minute visit on the following issues:    Although mod severe, she is much better since quit smoking and no longer tendency to aecopd  The proper method of use, as well as anticipated side effects, of a metered-dose inhaler are discussed and demonstrated to the patient. Improved effectiveness after extensive coaching during this visit to a level of approximately  90%   Each maintenance medication was reviewed in detail including most importantly the difference between maintenance and as needed and under what circumstances the prns are to be used.  Please see instructions for details which were reviewed in writing and the patient given a copy.

## 2015-05-30 ENCOUNTER — Other Ambulatory Visit: Payer: Self-pay | Admitting: Family Medicine

## 2015-06-02 ENCOUNTER — Encounter: Payer: Self-pay | Admitting: Family Medicine

## 2015-06-02 ENCOUNTER — Ambulatory Visit (INDEPENDENT_AMBULATORY_CARE_PROVIDER_SITE_OTHER): Payer: BLUE CROSS/BLUE SHIELD | Admitting: Family Medicine

## 2015-06-02 VITALS — BP 146/74 | HR 77 | Temp 98.5°F | Wt 175.8 lb

## 2015-06-02 DIAGNOSIS — M79662 Pain in left lower leg: Secondary | ICD-10-CM

## 2015-06-02 NOTE — Patient Instructions (Signed)
It was a pleasure seeing you today in our clinic. Today we discussed your leg pain. Here is the treatment plan we have discussed and agreed upon together:   - Pain should improve over the next 7-10 days. Some of these injuries to bone can take a long time to fully heal. - avoid taking any ibuprofen, Aleve, motrin, or other similar medications. - Tylenol is ideal to control the pain/discomfort (use as described on bottle) - Try to Ice, wrap, and elevate your leg at least 3 times a day to help control pain/swelling.

## 2015-06-02 NOTE — Progress Notes (Signed)
   HPI  CC: Lt lower leg pain Patient reports a 3wk hx of lower leg swelling and tenderness. Symptoms began about 3 weeks ago. She states she had "slammed" her leg on the side of her bed one night. At the time of injury she didn't appreciate any bleeding or skin swelling. The following day the area was tender and sore. Pain caused her to have a slight limp for 2-3 days and then eased up. Now, 3 weeks later, the pain and swelling is still present. She says her Lt leg and foot looks more swollen than the Rt, she has also noticed some ecchymosis on her medial heel (even though no pain or trauma was experienced here). Patient denies taking any medications for this. No weakness or sensation deficits. She is able to walk ok, but the site of trauma is still very TTP.  Review of Systems   See HPI for ROS. All other systems reviewed and are negative.  Past medical history and social history reviewed and updated in the EMR as appropriate.  Objective: BP 146/74 mmHg  Pulse 77  Temp(Src) 98.5 F (36.9 C) (Oral)  Wt 175 lb 12.8 oz (79.742 kg) Gen: NAD, alert, cooperative, and pleasant. CV: RRR, no murmur Resp: CTAB, no wheezes, non-labored Ext: pulses intact bilaterally, FROM in all 4 extremities. Strength 5/5 throughout. LLE notably more swollen at distal 1/2 of tibia and ankle. TTP over distal 1/3 of tibia. No ecchymosis or lesions noted here. Ecchymosis present at medial heel. No bony deformity appreciated. Gait normal. No pain w/ passive stretching Neuro: Alert and oriented, Speech clear, No gross deficits  Assessment and plan:  Tibial pain Patient experiencing tibial pain after blunt trauma to the distal end of the tibia. Signs/symptoms c/w subperiosteal hematoma. Ddx includes tibial fracture vs. microtrabecular fracture vs. muscular contusion vs. DVT. Patient is able to bear weight on LLE, she has no pain w/ muscle contraction, and pulses are present throughout, so these diagnoses are less  likely. - ACE wrap provided. Patient instructed to rest, Ice, Compress, and Elevate this limb when able. Proper technique discussed in clinic. - Patient not interested in small opiate prescription; instructed to take tylenol for pain and inflammation. Avoidance of NSAIDs discussed due to some evidence for delayed bony healing. - encouraged to return in 10-14 days if sxs do not improve.    Elberta Leatherwood, MD,MS,  PGY2 06/03/2015 5:44 PM

## 2015-06-03 DIAGNOSIS — M898X6 Other specified disorders of bone, lower leg: Secondary | ICD-10-CM | POA: Insufficient documentation

## 2015-06-03 NOTE — Assessment & Plan Note (Signed)
Patient experiencing tibial pain after blunt trauma to the distal end of the tibia. Signs/symptoms c/w subperiosteal hematoma. Ddx includes tibial fracture vs. microtrabecular fracture vs. muscular contusion vs. DVT. Patient is able to bear weight on LLE, she has no pain w/ muscle contraction, and pulses are present throughout, so these diagnoses are less likely. - ACE wrap provided. Patient instructed to rest, Ice, Compress, and Elevate this limb when able. Proper technique discussed in clinic. - Patient not interested in small opiate prescription; instructed to take tylenol for pain and inflammation. Avoidance of NSAIDs discussed due to some evidence for delayed bony healing. - encouraged to return in 10-14 days if sxs do not improve.

## 2015-06-04 ENCOUNTER — Encounter: Payer: Self-pay | Admitting: Family Medicine

## 2015-06-04 ENCOUNTER — Ambulatory Visit (INDEPENDENT_AMBULATORY_CARE_PROVIDER_SITE_OTHER): Payer: BLUE CROSS/BLUE SHIELD | Admitting: Family Medicine

## 2015-06-04 VITALS — BP 140/72 | HR 96 | Temp 98.5°F | Ht 65.0 in | Wt 173.2 lb

## 2015-06-04 DIAGNOSIS — H8110 Benign paroxysmal vertigo, unspecified ear: Secondary | ICD-10-CM

## 2015-06-04 MED ORDER — MECLIZINE HCL 25 MG PO TABS: 25.0000 mg | ORAL_TABLET | Freq: Three times a day (TID) | ORAL | Status: DC | PRN

## 2015-06-04 NOTE — Progress Notes (Signed)
Patient ID: [name], female   DOB: July 31, 1961, 54 y.o.   MRN: [id number]  HPI:  Pt presents for a same day appointment to discuss dizziness.  Began filling dizzy this morning. Felt okay when laying on R side, but when moved her head and tried to sit up got dizzy, like a spinning sensation. Feels nauseated but no vomiting. No fevers. No vision changes. No tinnitus or decreased hearing. No ear pain. Eating and drinking normally. No numbness/tingling/weakness in her body. No diarrhea or abdominal pain. No recent colds. Did not take any medication for this.   ROS: See HPI  [place]: hx anemia, COPD, GERD, HLD, HTN, sarcoidosis, sickle cell trait  PHYSICAL EXAM: BP 140/72 mmHg  Pulse 96  Temp(Src) 98.5 F (36.9 C) (Oral)  Ht 5\' 5"  (1.651 m)  Wt 173 lb 3.2 oz (78.563 kg)  BMI 28.82 kg/m2  Orthostatic VS for the past 24 hrs:  BP- Lying Pulse- Lying BP- Sitting Pulse- Sitting BP- Standing at 0 minutes Pulse- Standing at 0 minutes  06/04/15 1456 117/74 mmHg 80 127/80 mmHg 86 123/89 mmHg 97    Gen: NAD, pleasant, cooperative HEENT: NCAT, MMM, TM's clear bilat, nares patent Heart: RRR no murmur Lungs: CTAB NWOB Neuro: cranial nerves II-XII tested and intact. Speech normal. Full strength bilat upper and lower ext. Normal FNF. Negative homan's.  ASSESSMENT/PLAN:  1. Vertigo - neuro exam normal. No red flags. Orthostatics negative. Suspect BPPV. Will give trial of meclizine. Discussed nature of BPPV with patient and provided handout. She will f/u if not improving in the next week, at which time would consider referral for vestibular rehab.  FOLLOW UP: F/u as needed if symptoms worsen or do not improve.   [place]. Ardelia Mems, [place]

## 2015-06-04 NOTE — Patient Instructions (Signed)
Take meclizine 25mg  up to three times a day as needed for dizziness Use caution because this might make you sleepy.  Return next week if not better Go to ER if bad headache, vision changes, vomiting, fevers  Be well, Dr. Ardelia Mems   Benign Positional Vertigo Vertigo means you feel like you or your surroundings are moving when they are not. Benign positional vertigo is the most common form of vertigo. Benign means that the cause of your condition is not serious. Benign positional vertigo is more common in older adults. CAUSES  Benign positional vertigo is the result of an upset in the labyrinth system. This is an area in the middle ear that helps control your balance. This may be caused by a viral infection, head injury, or repetitive motion. However, often no specific cause is found. SYMPTOMS  Symptoms of benign positional vertigo occur when you move your head or eyes in different directions. Some of the symptoms may include:  Loss of balance and falls.  Vomiting.  Blurred vision.  Dizziness.  Nausea.  Involuntary eye movements (nystagmus). DIAGNOSIS  Benign positional vertigo is usually diagnosed by physical exam. If the specific cause of your benign positional vertigo is unknown, your caregiver may perform imaging tests, such as magnetic resonance imaging (MRI) or computed tomography (CT). TREATMENT  Your caregiver may recommend movements or procedures to correct the benign positional vertigo. Medicines such as meclizine, benzodiazepines, and medicines for nausea may be used to treat your symptoms. In rare cases, if your symptoms are caused by certain conditions that affect the inner ear, you may need surgery. HOME CARE INSTRUCTIONS   Follow your caregiver's instructions.  Move slowly. Do not make sudden body or head movements.  Avoid driving.  Avoid operating heavy machinery.  Avoid performing any tasks that would be dangerous to you or others during a vertigo  episode.  Drink enough fluids to keep your urine clear or pale yellow. SEEK IMMEDIATE MEDICAL CARE IF:   You develop problems with walking, weakness, numbness, or using your arms, hands, or legs.  You have difficulty speaking.  You develop severe headaches.  Your nausea or vomiting continues or gets worse.  You develop visual changes.  Your family or friends notice any behavioral changes.  Your condition gets worse.  You have a fever.  You develop a stiff neck or sensitivity to light. MAKE SURE YOU:   Understand these instructions.  Will watch your condition.  Will get help right away if you are not doing well or get worse. Document Released: 08/07/2006 Document Revised: 01/22/2012 Document Reviewed: 07/20/2011 [facility] Patient Information 2015 [place], [place]. This information is not intended to replace advice given to you by your health care provider. Make sure you discuss any questions you have with your health care provider.

## 2015-07-01 ENCOUNTER — Other Ambulatory Visit: Payer: Self-pay | Admitting: Family Medicine

## 2015-07-01 ENCOUNTER — Other Ambulatory Visit: Payer: Self-pay | Admitting: Internal Medicine

## 2015-07-01 DIAGNOSIS — Z1231 Encounter for screening mammogram for malignant neoplasm of breast: Secondary | ICD-10-CM

## 2015-07-06 ENCOUNTER — Encounter: Payer: Self-pay | Admitting: Family Medicine

## 2015-07-06 ENCOUNTER — Other Ambulatory Visit: Payer: Self-pay | Admitting: Family Medicine

## 2015-07-06 ENCOUNTER — Ambulatory Visit (INDEPENDENT_AMBULATORY_CARE_PROVIDER_SITE_OTHER): Payer: BLUE CROSS/BLUE SHIELD | Admitting: Family Medicine

## 2015-07-06 VITALS — BP 141/68 | HR 62 | Temp 98.1°F | Ht 65.0 in | Wt 174.6 lb

## 2015-07-06 DIAGNOSIS — I10 Essential (primary) hypertension: Secondary | ICD-10-CM

## 2015-07-06 DIAGNOSIS — R062 Wheezing: Secondary | ICD-10-CM

## 2015-07-06 DIAGNOSIS — N951 Menopausal and female climacteric states: Secondary | ICD-10-CM | POA: Diagnosis not present

## 2015-07-06 MED ORDER — ESTRADIOL 0.5 MG PO TABS: 0.5000 mg | ORAL_TABLET | Freq: Every day | ORAL | Status: DC

## 2015-07-06 NOTE — Patient Instructions (Addendum)
Thank you for coming to see me today. It was a pleasure. Today we talked about:   Hypertension: I will recheck it next time.  Wheezing: i could not hear any wheezing today.  Menopausal symptoms: I am refilling your estrogen  Please make an appointment to see me in 3 months for follow-up of blood pressure.  If you have any questions or concerns, please do not hesitate to call the office at [phone].  Sincerely,  Cordelia Poche, MD

## 2015-07-06 NOTE — Progress Notes (Signed)
    Subjective    [name] is a 54 y.o. female that presents for a follow-up visit for chronic issues.   1. Estrogen replacement: she reports no history of blood clots. She is scheduled for a mammogram tomorrow. She has a history of hysterectomy for fibroids. She reports no side effects with estrogen therapy. She has a family history of breast cancer in her mom. She is having some mild hot flashes at night, but is tolerable.  2. Wheezing: Symptoms started a few days ago. States that she hears it in her nose and sometimes in her chest. She reports chronic shortness of breath that is not worsened. No chest pain. She is adherent with Symbicort and Flonase. She has not used her albuterol for about one month.  3. Hypertension: She is adherent with metoprolol and hydrochlorothiazide. No chest pain and she has chronic shortness of breath.  Social History  Substance Use Topics  . Smoking status: Former Smoker -- 1.00 packs/day for 20 years    Quit date: 11/14/2003  . Smokeless tobacco: Never Used  . Alcohol Use: No    Allergies  Allergen Reactions  . Codeine Other (See Comments)    Avoids-felt bad when took before    No orders of the defined types were placed in this encounter.    ROS  Per HPI   Objective   BP 141/68 mmHg  Pulse 62  Temp(Src) 98.1 F (36.7 C) (Oral)  Ht 5\' 5"  (1.651 m)  Wt 174 lb 9.6 oz (79.198 kg)  BMI 29.05 kg/m2  General: Well appearing, no distress Extremities: No swelling or tenderness  Assessment and Plan   Please refer to problem based charting of assessment and plan  Wheezing: not heard on exam. Patient with chronic dyspnea that is not worsened. Reports sound more upper airway rather than lower airway  Follow-up PRN

## 2015-07-07 ENCOUNTER — Ambulatory Visit (HOSPITAL_COMMUNITY)
Admission: RE | Admit: 2015-07-07 | Discharge: 2015-07-07 | Disposition: A | Discharge status: Home / Self Care | Payer: BLUE CROSS/BLUE SHIELD | Source: Ambulatory Visit | Attending: Internal Medicine | Admitting: Internal Medicine

## 2015-07-07 DIAGNOSIS — Z1231 Encounter for screening mammogram for malignant neoplasm of breast: Secondary | ICD-10-CM

## 2015-07-07 NOTE — Assessment & Plan Note (Signed)
Slightly above goal of 140/90. No changes today.

## 2015-07-07 NOTE — Assessment & Plan Note (Signed)
Will continue at current dose. Follow-up in three months. May decrease dose at that time. Patient to obtain mammogram. No red flags.

## 2015-07-09 ENCOUNTER — Other Ambulatory Visit: Payer: Self-pay | Admitting: Internal Medicine

## 2015-07-09 DIAGNOSIS — R928 Other abnormal and inconclusive findings on diagnostic imaging of breast: Secondary | ICD-10-CM

## 2015-07-16 ENCOUNTER — Ambulatory Visit
Admission: RE | Admit: 2015-07-16 | Discharge: 2015-07-16 | Disposition: A | Discharge status: Home / Self Care | Payer: BLUE CROSS/BLUE SHIELD | Source: Ambulatory Visit | Attending: Internal Medicine | Admitting: Internal Medicine

## 2015-07-16 ENCOUNTER — Other Ambulatory Visit: Payer: Self-pay | Admitting: Internal Medicine

## 2015-07-16 DIAGNOSIS — R928 Other abnormal and inconclusive findings on diagnostic imaging of breast: Secondary | ICD-10-CM

## 2015-07-16 DIAGNOSIS — R921 Mammographic calcification found on diagnostic imaging of breast: Secondary | ICD-10-CM

## 2015-07-23 ENCOUNTER — Ambulatory Visit
Admission: RE | Admit: 2015-07-23 | Discharge: 2015-07-23 | Disposition: A | Discharge status: Home / Self Care | Payer: BLUE CROSS/BLUE SHIELD | Source: Ambulatory Visit | Attending: Internal Medicine | Admitting: Internal Medicine

## 2015-07-23 ENCOUNTER — Other Ambulatory Visit: Payer: Self-pay | Admitting: Internal Medicine

## 2015-07-23 DIAGNOSIS — R921 Mammographic calcification found on diagnostic imaging of breast: Secondary | ICD-10-CM

## 2015-07-29 ENCOUNTER — Telehealth: Payer: Self-pay | Admitting: Family Medicine

## 2015-07-29 NOTE — Telephone Encounter (Signed)
Attempted to call patient regarding results of breast biopsy. Left message for patient to call back. Will try again another time.

## 2015-07-29 NOTE — Telephone Encounter (Signed)
-----   Message from Madilyn Fireman, MD sent at 07/29/2015  9:50 AM EDT ----- Regarding: Your patient's result Please find results for surgical pathology of your patient attached. They were sent to my inbasket. Please acknowledge receipt.  Madilyn Fireman MD MPH 07/29/2015 9:51 AM  ----- Message -----    From: Lab in [facility] [place] [facility] Interface    Sent: 07/26/2015   9:47 AM      To: Madilyn Fireman, MD

## 2015-07-29 NOTE — Telephone Encounter (Signed)
Pt returned call. She was at work but is off now and can be reached

## 2015-07-30 NOTE — Telephone Encounter (Signed)
Pt is returning Dr. Lisbeth Ply call . She will be home the rest of the day. jw

## 2015-07-30 NOTE — Telephone Encounter (Signed)
Attempted to call patient again. Left message for patient to leave a best time to call.

## 2015-08-02 ENCOUNTER — Other Ambulatory Visit: Payer: Self-pay | Admitting: General Surgery

## 2015-08-02 ENCOUNTER — Other Ambulatory Visit: Payer: Self-pay | Admitting: Family Medicine

## 2015-08-02 DIAGNOSIS — C50411 Malignant neoplasm of upper-outer quadrant of right female breast: Secondary | ICD-10-CM

## 2015-08-03 ENCOUNTER — Telehealth: Payer: Self-pay | Admitting: *Deleted

## 2015-08-03 NOTE — Telephone Encounter (Signed)
Spoke with patient regarding recent diagnosis of intraductal carcinoma in situ. Patient has discontinued estrogen therapy. She is awaiting consultation with radiology and oncology. Answered questions.

## 2015-08-03 NOTE — Telephone Encounter (Signed)
Received referral from [facility].  Called pt and confirmed 08/04/15 appt w/ pt.  Unable to mail packet.  Gave verbal, direction, instructions and placed a note for an intake form to be given to the pt at time of check in.  Emailed Nilda Simmer and [place] at [facility] to make them aware.  Placed a copy of the records in Dr. Geralyn Flash box and took one to HIM to scan.

## 2015-08-04 ENCOUNTER — Encounter: Payer: Self-pay | Admitting: Genetic Counselor

## 2015-08-04 ENCOUNTER — Ambulatory Visit (HOSPITAL_BASED_OUTPATIENT_CLINIC_OR_DEPARTMENT_OTHER): Payer: BLUE CROSS/BLUE SHIELD | Admitting: Hematology and Oncology

## 2015-08-04 VITALS — BP 165/82 | HR 80 | Temp 98.2°F | Resp 19 | Ht 65.0 in | Wt 173.8 lb

## 2015-08-04 DIAGNOSIS — D869 Sarcoidosis, unspecified: Secondary | ICD-10-CM

## 2015-08-04 DIAGNOSIS — C50411 Malignant neoplasm of upper-outer quadrant of right female breast: Secondary | ICD-10-CM | POA: Diagnosis not present

## 2015-08-04 NOTE — Assessment & Plan Note (Signed)
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 After reviewing the pathology in detail, we proceeded to discuss the different treatment options between surgery, radiation, chemotherapy, antiestrogen therapies.  Recommendations: 1. Breast conserving surgery followed by 2. Oncotype DX testing to determine if chemotherapy would be of any benefit followed by 3. Adjuvant radiation therapy followed by 4. Adjuvant antiestrogen therapy  Oncotype counseling: I discussed Oncotype DX test. I explained to the patient that this is a 21 gene panel to evaluate patient tumors DNA to calculate recurrence score. This would help determine whether patient has high risk or intermediate risk or low risk breast cancer. She understands that if her tumor was found to be high risk, she would benefit from systemic chemotherapy. If low risk, no need of chemotherapy. If she was found to be intermediate risk, we would need to evaluate the score as well as other risk factors and determine if an abbreviated chemotherapy may be of benefit.  Return to clinic after surgery to discuss final pathology report and then determine if Oncotype DX testing will need to be sent.

## 2015-08-04 NOTE — Progress Notes (Signed)
[facility] CONSULT NOTE  Patient Care Team: Mariel Aloe, MD as PCP - General (Family Medicine)  CHIEF COMPLAINTS/PURPOSE OF CONSULTATION:  Newly diagnosed breast cancer  HISTORY OF PRESENTING ILLNESS:  [name] 54 y.o. female is here because of recent diagnosis of right breast cancer. She had a routine screening mammogram which revealed calcifications in the right breast. She then underwent stereotactic biopsy which came back as invasive ductal carcinoma with DCIS. ER/PR positive HER-2 negative. She had seen Dr. Barry Dienes who sent her over to [place] to discuss treatment plan. She has a past medical history of sarcoidosis and does have shortness of breath to moderate exertion. She is also seeing radiation oncology. Her mother is a patient of mine with metastatic breast cancer.  I reviewed her records extensively and collaborated the history with the patient.  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 Hypertension   . Sarcoidosis STABLE PER CXR JUNE 2013  . Long-term current use of steroids SYMBICORT INHALER  . Allergic rhinitis   . HTN (hypertension)   . Iron deficiency anemia   . 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 Sickle cell trait   . 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 Shortness of breath     SURGICAL HISTORY: Past Surgical History  Procedure Laterality Date  . Cesarean section  1986    W/ BILATERAL TUBAL LIGATION  . Total robotic assisted laparoscopic hysterectomy  12-30-2010    SYMPTOMATIC UTERINE FIBROIDS  . Upper teeth extraction's  1992  . Median  rectus repair  05/22/2012    Procedure: MEDIAN RECTUS REPAIR;  Surgeon: Dara Hoyer, MD;  Location: [facility];  Service: Ophthalmology;  Laterality: Bilateral;  INFERIOR RECTUS RESECTION WITH ADJUSTIBLE SUTURES RIGHT EYE   . Adjustable suture manipulation  05/22/2012    Procedure: ADJUSTABLE SUTURE MANIPULATION;  Surgeon: Dara Hoyer, MD;  Location: [facility];  Service: Ophthalmology;  Laterality: Right;  . Eye surgery      SOCIAL HISTORY: Social History   Social History  . Marital Status: Single    Spouse Name: N/A  . Number of Children: N/A  . Years of Education: N/A   Occupational History  . Not on file.   Social History Main Topics  . Smoking status: Former Smoker -- 1.00 packs/day for 20 years    Quit date: 11/14/2003  . Smokeless tobacco: Never Used  . Alcohol Use: No  . Drug Use: No  . Sexual Activity: Not on file   Other Topics Concern  . Not on file   Social History Narrative   Lives with mother and his 3 children.  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 Hyperlipidemia Mother   . Asthma Mother   . Lupus Mother   . Hypertension Father   . Hyperlipidemia Father   . Hypertension Sister   . Hypertension Brother   . Cancer Neg Hx   . Diabetes Neg Hx   .  Coronary artery disease Neg Hx   . Breast cancer Mother 59    ALLERGIES:  is allergic to codeine.  MEDICATIONS:  Current Outpatient Prescriptions  Medication Sig Dispense Refill  . albuterol (PROVENTIL HFA;VENTOLIN HFA) 108 (90 BASE) MCG/ACT inhaler Inhale 2 puffs into the lungs every 4 (four) hours as needed for wheezing or shortness of breath. For shortness of breath or wheezing 1 Inhaler 1  . aspirin EC 81 MG EC tablet Take 1 tablet (81 mg total) by mouth daily. 30 tablet 11  . fluticasone (FLONASE) 50 MCG/ACT nasal spray Place 2 sprays into both nostrils daily as needed for  allergies. 16 g 11  . hydrochlorothiazide (HYDRODIURIL) 12.5 MG tablet Take 1 tablet (12.5 mg total) by mouth daily. 90 tablet 3  . meclizine (ANTIVERT) 25 MG tablet Take 1 tablet (25 mg total) by mouth 3 (three) times daily as needed for dizziness. 30 tablet 0  . metoprolol tartrate (LOPRESSOR) 25 MG tablet TAKE ONE TABLET BY MOUTH TWICE DAILY 60 tablet 5  . Multiple Vitamin (MULTIVITAMIN) capsule Take 1 capsule by mouth daily.    Marland Kitchen omeprazole (PRILOSEC) 20 MG capsule TAKE ONE CAPSULE BY MOUTH ONCE DAILY. TAKE CAPSULE 30- 60 MINUTES BEFORE YOUR FIRST AND LAST  MEALS OF THE DAY 60 capsule 3  . SYMBICORT 160-4.5 MCG/ACT inhaler INHALE TWO PUFFS BY MOUTH TWICE DAILY IN THE MORNING AND EVENING 1 Inhaler 3   No current facility-administered medications for this visit.    REVIEW OF SYSTEMS:   Constitutional: Denies fevers, chills or abnormal night sweats Eyes: Denies blurriness of vision, double vision or watery eyes Ears, nose, mouth, throat, and face: Denies mucositis or sore throat Respiratory: Shortness of breath to exertion from sarcoidosis Cardiovascular: Denies palpitation, chest discomfort or lower extremity swelling Gastrointestinal:  Denies nausea, heartburn or change in bowel habits Skin: Denies abnormal skin rashes Lymphatics: Denies new lymphadenopathy or easy bruising Neurological:Denies numbness, tingling or new weaknesses Behavioral/Psych: Mood is stable, no new changes  Breast:  Denies any palpable lumps or discharge All other systems were reviewed with the patient and are negative.  PHYSICAL EXAMINATION: ECOG PERFORMANCE STATUS: 1 - Symptomatic but completely ambulatory  Filed Vitals:   08/04/15 1240  BP: 165/82  Pulse: 80  Temp: 98.2 F (36.8 C)  Resp: 19   Filed Weights   08/04/15 1240  Weight: 173 lb 12.8 oz (78.835 kg)    GENERAL:alert, no distress and comfortable SKIN: skin color, texture, turgor are normal, no rashes or significant lesions EYES: normal,  conjunctiva are pink and non-injected, sclera clear OROPHARYNX:no exudate, no erythema and lips, buccal mucosa, and tongue normal  NECK: supple, thyroid normal size, non-tender, without nodularity LYMPH:  no palpable lymphadenopathy in the cervical, axillary or inguinal LUNGS: clear to auscultation and percussion with normal breathing effort HEART: regular rate & rhythm and no murmurs and no lower extremity edema ABDOMEN:abdomen soft, non-tender and normal bowel sounds Musculoskeletal:no cyanosis of digits and no clubbing  PSYCH: alert & oriented x 3 with fluent speech NEURO: no focal motor/sensory deficits BREAST: Palpable nodule in the right breast outer quadrant where she had the biopsy. 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 After reviewing the pathology in detail, we proceeded to discuss the different treatment options between surgery, radiation, chemotherapy, antiestrogen therapies.  Recommendations: 1. Breast conserving surgery followed by 2. Oncotype DX testing to  determine if chemotherapy would be of any benefit followed by 3. Adjuvant radiation therapy followed by 4. Adjuvant antiestrogen therapy  Oncotype counseling: I discussed Oncotype DX test. I explained to the patient that this is a 21 gene panel to evaluate patient tumors DNA to calculate recurrence score. This would help determine whether patient has high risk or intermediate risk or low risk breast cancer. She understands that if her tumor was found to be high risk, she would benefit from systemic chemotherapy. If low risk, no need of chemotherapy. If she was found to be intermediate risk, we would need to evaluate the score as well as other risk factors and determine if an abbreviated chemotherapy may be of benefit.  Return to clinic after surgery to discuss final pathology report and then determine if Oncotype DX testing will need to be sent.  All questions were answered. The patient knows to call the clinic with any problems, questions or concerns.    Rulon Eisenmenger, MD 1:41 PM

## 2015-08-10 NOTE — Progress Notes (Signed)
Location of Breast Cancer: Right upper outer breast  Histology per Pathology Report:  07/23/15 Diagnosis Breast, right, needle core biopsy, upper outer - INVASIVE DUCTAL CARCINOMA. - DUCTAL CARCINOM IN SITU WITH CALCIFICATIONS. - LYMPHOVASCULAR INVASION IS IDENTIFIED.  Receptor Status: ER(100), PR (100), Her2-neu (neg), Ki-67(20%)  Did patient present with symptoms (if so, please note symptoms) or was this found on screening mammography?: routine screening mammogram which showed calcifications in right breast.  Past/Anticipated interventions by surgeon, if any: biopsy of right breast  Past/Anticipated interventions by medical oncology, if any: adjuvant antiestrogen therapy after radiation  Lymphedema issues, if any:   none  Pain issues, if any:  none  SAFETY ISSUES:  Prior radiation no  Pacemaker/ICD? no  Possible current pregnancy?no  Is the patient on methotrexate? no  Current Complaints / other details:  Pt has good range of motion.   Her advanced directives are already in epic [place], Stephani Police, RN 08/10/2015,10:15 AM

## 2015-08-11 ENCOUNTER — Ambulatory Visit
Admission: RE | Admit: 2015-08-11 | Discharge: 2015-08-11 | Disposition: A | Discharge status: Home / Self Care | Payer: BLUE CROSS/BLUE SHIELD | Source: Ambulatory Visit | Attending: Radiation Oncology | Admitting: Radiation Oncology

## 2015-08-11 ENCOUNTER — Encounter: Payer: Self-pay | Admitting: Radiation Oncology

## 2015-08-11 ENCOUNTER — Other Ambulatory Visit: Payer: Self-pay | Admitting: General Surgery

## 2015-08-11 ENCOUNTER — Ambulatory Visit: Payer: BLUE CROSS/BLUE SHIELD

## 2015-08-11 VITALS — BP 135/83 | HR 77 | Temp 98.2°F | Ht 65.0 in | Wt 174.7 lb

## 2015-08-11 DIAGNOSIS — J449 Chronic obstructive pulmonary disease, unspecified: Secondary | ICD-10-CM | POA: Diagnosis not present

## 2015-08-11 DIAGNOSIS — Z510 Encounter for antineoplastic radiation therapy: Secondary | ICD-10-CM | POA: Insufficient documentation

## 2015-08-11 DIAGNOSIS — D509 Iron deficiency anemia, unspecified: Secondary | ICD-10-CM | POA: Diagnosis not present

## 2015-08-11 DIAGNOSIS — Z7951 Long term (current) use of inhaled steroids: Secondary | ICD-10-CM | POA: Diagnosis not present

## 2015-08-11 DIAGNOSIS — C50411 Malignant neoplasm of upper-outer quadrant of right female breast: Secondary | ICD-10-CM | POA: Diagnosis present

## 2015-08-11 DIAGNOSIS — Z803 Family history of malignant neoplasm of breast: Secondary | ICD-10-CM | POA: Diagnosis not present

## 2015-08-11 DIAGNOSIS — C50911 Malignant neoplasm of unspecified site of right female breast: Secondary | ICD-10-CM

## 2015-08-11 DIAGNOSIS — Z170 Estrogen receptor positive status [ER+]: Secondary | ICD-10-CM | POA: Diagnosis not present

## 2015-08-11 DIAGNOSIS — D573 Sickle-cell trait: Secondary | ICD-10-CM | POA: Insufficient documentation

## 2015-08-11 DIAGNOSIS — D869 Sarcoidosis, unspecified: Secondary | ICD-10-CM | POA: Diagnosis not present

## 2015-08-11 DIAGNOSIS — Z8249 Family history of ischemic heart disease and other diseases of the circulatory system: Secondary | ICD-10-CM | POA: Insufficient documentation

## 2015-08-11 DIAGNOSIS — I10 Essential (primary) hypertension: Secondary | ICD-10-CM | POA: Insufficient documentation

## 2015-08-11 NOTE — Progress Notes (Signed)
See progress note under physcian encounter

## 2015-08-11 NOTE — Progress Notes (Signed)
Radiation Oncology         [phone] ________________________________  Initial outpatient Consultation  Name: [name] MRN: [id number]  Date: 08/11/2015  DOB: 09/28/1961  NA:TFTDD Erin Prude, MD  Stark Klein, MD   REFERRING PHYSICIAN: Stark Klein, MD  DIAGNOSIS:    ICD-9-CM ICD-10-CM   1. 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 female who presented with abnormality on screening mammogram,    (10m of suspicious calcifications).  Biopsy showed Invasive Ductal Carcinoma with DCIS, ER+ / PR+ / Her2neg, Grade 2-3.  She sees [name] for sarcoidosis and COPD; quit smoking 11 years ago. She stopped her HRT.  Reports hot flashes and wheezing today. Otherwise in her USOH.  Her mother has metastatic breast cancer. Anticipates lumpectomy and oncotype Dx testing.  PREVIOUS RADIATION THERAPY: No  PAST MEDICAL HISTORY:  has a past medical history of Hypertension; Sarcoidosis (STABLE PER CXR JUNE 2013); Long-term current use of steroids (SYMBICORT INHALER); Allergic rhinitis; HTN (hypertension); Iron deficiency anemia; COPD, mild (FOLLOWED BY [name]); Sickle cell trait; 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; and Shortness of breath.    PAST SURGICAL HISTORY: Past Surgical History  Procedure Laterality Date  . Cesarean section  1986    W/ BILATERAL TUBAL LIGATION  . Total robotic assisted laparoscopic hysterectomy  12-30-2010    SYMPTOMATIC UTERINE FIBROIDS  . Upper teeth extraction's  1992  . Median rectus repair  05/22/2012    Procedure: MEDIAN RECTUS REPAIR;  Surgeon: Erin Hoyer, MD;  Location: [facility];  Service: Ophthalmology;  Laterality: Bilateral;  INFERIOR RECTUS RESECTION WITH ADJUSTIBLE SUTURES RIGHT EYE   . Adjustable suture manipulation  05/22/2012    Procedure: ADJUSTABLE SUTURE  MANIPULATION;  Surgeon: Erin Hoyer, MD;  Location: [facility];  Service: Ophthalmology;  Laterality: Right;  . Eye surgery      FAMILY HISTORY: family history includes Asthma in her mother; Breast cancer (age of onset: 56) in her mother; Hyperlipidemia in her father and mother; Hypertension in her brother, father, and sister; Lupus in her mother. There is no history of Cancer, Diabetes, or Coronary artery disease.  SOCIAL HISTORY:  reports that she quit smoking about 11 years ago. She has never used smokeless tobacco. She reports that she does not drink alcohol or use illicit drugs.  ALLERGIES: Codeine  MEDICATIONS:  Current Outpatient Prescriptions  Medication Sig Dispense Refill  . albuterol (PROVENTIL HFA;VENTOLIN HFA) 108 (90 BASE) MCG/ACT inhaler Inhale 2 puffs into the lungs every 4 (four) hours as needed for wheezing or shortness of breath. For shortness of breath or wheezing 1 Inhaler 1  . fluticasone (FLONASE) 50 MCG/ACT nasal spray Place 2 sprays into both nostrils daily as needed for allergies. 16 g 11  . hydrochlorothiazide (HYDRODIURIL) 12.5 MG tablet Take 1 tablet (12.5 mg total) by mouth daily. 90 tablet 3  . metoprolol tartrate (LOPRESSOR) 25 MG tablet TAKE ONE TABLET BY MOUTH TWICE DAILY 60 tablet 5  . Multiple Vitamin (MULTIVITAMIN) capsule Take 1 capsule by mouth daily.    Marland Kitchen omeprazole (PRILOSEC) 20 MG capsule TAKE ONE CAPSULE BY MOUTH ONCE DAILY. TAKE CAPSULE 30- 60 MINUTES BEFORE YOUR FIRST AND LAST  MEALS OF THE DAY 60 capsule 3  . SYMBICORT 160-4.5 MCG/ACT inhaler INHALE TWO PUFFS BY MOUTH TWICE  DAILY IN THE MORNING AND EVENING 1 Inhaler 3  . aspirin EC 81 MG EC tablet Take 1 tablet (81 mg total) by mouth daily. (Patient not taking: Reported on 08/11/2015) 30 tablet 11  . meclizine (ANTIVERT) 25 MG tablet Take 1 tablet (25 mg total) by mouth 3 (three) times daily as needed for dizziness. (Patient not taking: Reported on 08/11/2015) 30 tablet 0   No  current facility-administered medications for this encounter.    REVIEW OF SYSTEMS:  Notable for that above.   PHYSICAL EXAM:  height is 5\' 5"  (1.651 m) and weight is 174 lb 11.2 oz (79.243 kg). Her temperature is 98.2 F (36.8 C). Her blood pressure is 135/83 and her pulse is 77.   General: Alert and oriented, in no acute distress HEENT: Head is normocephalic. Extraocular movements are intact. Oropharynx is clear. Neck: Neck is supple, no palpable cervical or supraclavicular lymphadenopathy. Heart: Regular in rate and rhythm with no murmurs, rubs, or gallops. Chest: Clear to auscultation bilaterally, with no rhonchi, wheezes, or rales. Abdomen: Soft, nontender, nondistended, with no rigidity or guarding. Extremities: No cyanosis or edema. Lymphatics: see Neck Exam Skin: No concerning lesions. Musculoskeletal: symmetric strength and muscle tone throughout. Neurologic: Cranial nerves II through XII are grossly intact. No obvious focalities. Speech is fluent. Coordination is intact. Psychiatric: Judgment and insight are intact. Affect is appropriate. Breasts: 2cm palpable lump in UOQ, right breast. No other masses appreciated in breasts or axillae    ECOG = 0  0 - Asymptomatic (Fully active, able to carry on all predisease activities without restriction)  1 - Symptomatic but completely ambulatory (Restricted in physically strenuous activity but ambulatory and able to carry out work of a light or sedentary nature. For example, light housework, office work)  2 - Symptomatic, <50% in bed during the day (Ambulatory and capable of all self care but unable to carry out any work activities. Up and about more than 50% of waking hours)  3 - Symptomatic, >50% in bed, but not bedbound (Capable of only limited self-care, confined to bed or chair 50% or more of waking hours)  4 - Bedbound (Completely disabled. Cannot carry on any self-care. Totally confined to bed or chair)  5 - Death   [name]  MM, Creech RH, Tormey DC, et al. [phone]). "Toxicity and response criteria of the [facility]". [place] Oncol. 5 (6): 649-55   LABORATORY DATA:  Lab Results  Component Value Date   WBC 7.2 04/17/2015   HGB 12.3 04/17/2015   HCT 36.9 04/17/2015   MCV 78.2 04/17/2015   PLT 316 04/17/2015   CMP     Component Value Date/Time   NA 136 04/17/2015 1611   K 3.6 04/17/2015 1611   CL 97* 04/17/2015 1611   CO2 33* 04/17/2015 1611   GLUCOSE 97 04/17/2015 1611   BUN 8 04/17/2015 1611   CREATININE 0.77 04/17/2015 1611   CREATININE 0.64 02/04/2015 1619   CALCIUM 9.3 04/17/2015 1611   PROT 7.5 04/17/2015 1611   ALBUMIN 3.8 04/17/2015 1611   AST 18 04/17/2015 1611   ALT 13* 04/17/2015 1611   ALKPHOS 73 04/17/2015 1611   BILITOT 0.5 04/17/2015 1611   GFRNONAA >60 04/17/2015 1611   GFRAA >60 04/17/2015 1611         RADIOGRAPHY: Mm Digital Diagnostic Unilat R  07/23/2015   CLINICAL DATA:  Status post stereotactic guided biopsy of right breast calcifications earlier today.  EXAM: DIAGNOSTIC RIGHT MAMMOGRAM POST  STEREOTACTIC BIOPSY  COMPARISON:  Previous exam(s).  FINDINGS: Mammographic images were obtained following stereotactic guided biopsy of right breast calcifications. An X shaped clip was placed at the conclusion of the procedure. The clip is well positioned at the site of the biopsied calcifications in the upper-outer quadrant of the right breast.  IMPRESSION: Postprocedure mammogram for clip placement. Clip is well positioned at the site of biopsied calcifications in the upper-outer quadrant of the right breast.  Final Assessment: Post Procedure Mammograms for Marker Placement   Electronically Signed   By: Franki Cabot M.D.   On: 07/23/2015 12:33   Mm Digital Diagnostic Unilat R  07/16/2015   CLINICAL DATA:  Screening recall for right breast calcifications.  EXAM: DIGITAL DIAGNOSTIC RIGHT MAMMOGRAM  COMPARISON:  Previous exam(s).  ACR Breast Density Category c: The  breast tissue is heterogeneously dense, which may obscure small masses.  FINDINGS: Spot compression magnification views were performed over the upper outer far posterior right breast demonstrating grouped coarse heterogeneous calcifications spanning a distance of approximately 11 mm.  IMPRESSION: Suspicious right breast calcifications.  RECOMMENDATION: Stereotactic guided biopsy of the suspicious calcifications in the upper outer far posterior right breast is recommended. Given the difficult far posterior upper-outer location, the patient is scheduled for biopsy on the [facility] unit. This is scheduled for 07/23/2015 at 10:45 a.m.  I have discussed the findings and recommendations with the patient. Results were also provided in writing at the conclusion of the visit. If applicable, a reminder letter will be sent to the patient regarding the next appointment.  BI-RADS CATEGORY  4: Suspicious.   Electronically Signed   By: Everlean Alstrom M.D.   On: 07/16/2015 14:53   Mm Rt Breast Bx W Loc Dev 1st Lesion Image Bx Spec Stereo Guide  07/26/2015   ADDENDUM REPORT: 07/26/2015 11:06  ADDENDUM: Pathology revealed grade II to III invasive ductal carcinoma and ductal carcinoma in situ with calcifications and lymphovascular invasion in the right breast. This was found to be concordant by Erin. Franki Cabot. Pathology was discussed with the patient by telephone. She reported doing well after the biopsy with tenderness and minimal bruising at the site. Post biopsy instructions and care were reviewed and her questions were answered. Surgical consultation has been scheduled with Erin. Stark Klein at [facility] on August 02, 2015. She was encouraged to come to [facility] for educational materials. My number was provided for additional questions and concerns.  Pathology results reported by Susa Raring RN, BSN on July 26, 2015.   Electronically Signed   By: Franki Cabot  M.D.   On: 07/26/2015 11:06   07/26/2015   CLINICAL DATA:  Patient with right breast calcifications presents today stereotactic - guided biopsy.  EXAM: RIGHT BREAST STEREOTACTIC CORE NEEDLE BIOPSY  COMPARISON:  Previous exams.  FINDINGS: The patient and I discussed the procedure of stereotactic-guided biopsy including benefits and alternatives. We discussed the high likelihood of a successful procedure. We discussed the risks of the procedure including infection, bleeding, tissue injury, clip migration, and inadequate sampling. Informed written consent was given. The usual time out protocol was performed immediately prior to the procedure.  Using sterile technique and 1% lidocaine as local anesthetic, under stereotactic guidance, a 9 gauge vacuum assisted biopsy device was used to perform core needle biopsy of calcifications in the upper-outer quadrant of the right breast using a lateral approach. Specimen radiograph was performed showing calcifications. Specimens with calcifications are identified for  pathology.  At the conclusion of the procedure, a X shaped tissue marker clip was deployed into the biopsy cavity. Follow-up 2-view mammogram was performed and dictated separately.  IMPRESSION: Stereotactic-guided biopsy of grouped calcifications in the upper-outer quadrant of the right breast. No apparent complications.  Electronically Signed: By: Franki Cabot M.D. On: 07/23/2015 12:32      IMPRESSION/PLAN: Right breast cancer.  I talked to her about the option of a mastectomy and informed her that her expected overall survival would be equivalent between mastectomy and breast conservation, based upon randomized controlled data. She is enthusiastic about breast conservation.  I do think she is a good candidate for radiotherapy to the right breast.  I would try to limit her lung dose particularly in light of her underlying comorbidities.  It was a pleasure meeting the patient today. We discussed the risks,  benefits, and side effects of radiotherapy. We discussed that radiation would take approximately 6 weeks to complete and that I would give the patient a few weeks to heal following surgery before starting treatment planning. We spoke about acute effects including skin irritation and fatigue as well as much less common late effects including lung irritation. We spoke about the latest technology that is used to minimize the risk of late effects for breast cancer patients undergoing radiotherapy. No guarantees of treatment were given. The patient is enthusiastic about proceeding with treatment. I look forward to participating in the patient's care.    __________________________________________   Eppie Gibson, MD

## 2015-08-12 ENCOUNTER — Encounter (HOSPITAL_BASED_OUTPATIENT_CLINIC_OR_DEPARTMENT_OTHER): Payer: Self-pay | Admitting: *Deleted

## 2015-08-13 ENCOUNTER — Other Ambulatory Visit: Payer: Self-pay | Admitting: General Surgery

## 2015-08-13 ENCOUNTER — Encounter (HOSPITAL_BASED_OUTPATIENT_CLINIC_OR_DEPARTMENT_OTHER)
Admission: RE | Admit: 2015-08-13 | Discharge: 2015-08-13 | Disposition: A | Discharge status: Home / Self Care | Payer: BLUE CROSS/BLUE SHIELD | Source: Ambulatory Visit | Attending: General Surgery | Admitting: General Surgery

## 2015-08-13 DIAGNOSIS — C50411 Malignant neoplasm of upper-outer quadrant of right female breast: Secondary | ICD-10-CM | POA: Diagnosis not present

## 2015-08-13 DIAGNOSIS — Z01818 Encounter for other preprocedural examination: Secondary | ICD-10-CM | POA: Insufficient documentation

## 2015-08-13 LAB — CBC
HCT: 38.3 % (ref 36.0–46.0)
Hemoglobin: 12.5 g/dL (ref 12.0–15.0)
MCH: 26 pg (ref 26.0–34.0)
MCHC: 32.6 g/dL (ref 30.0–36.0)
MCV: 79.6 fL (ref 78.0–100.0)
PLATELETS: 326 10*3/uL (ref 150–400)
RBC: 4.81 MIL/uL (ref 3.87–5.11)
RDW: 14.6 % (ref 11.5–15.5)
WBC: 6.6 10*3/uL (ref 4.0–10.5)

## 2015-08-13 LAB — POCT I-STAT, CHEM 8
BUN: 8 mg/dL (ref 6–20)
CALCIUM ION: 1.18 mmol/L (ref 1.12–1.23)
Chloride: 99 mmol/L — ABNORMAL LOW (ref 101–111)
Creatinine, Ser: 0.8 mg/dL (ref 0.44–1.00)
GLUCOSE: 78 mg/dL (ref 65–99)
HCT: 42 % (ref 36.0–46.0)
HEMOGLOBIN: 14.3 g/dL (ref 12.0–15.0)
Potassium: 3.6 mmol/L (ref 3.5–5.1)
Sodium: 142 mmol/L (ref 135–145)
TCO2: 30 mmol/L (ref 0–100)

## 2015-08-13 NOTE — Progress Notes (Signed)
Patient in for labs and EKG. States taking meds for COPD as prescribed. No home use of 02. Denies shortness of breath or recent illness. Reviewed with Dr. Glennon Mac since stable no consult needed prior to sx. Takes meds and use inhalers as prescribed prior to sx bring all meds day of sx.Patient verbalized understanding.

## 2015-08-16 ENCOUNTER — Ambulatory Visit
Admission: RE | Admit: 2015-08-16 | Discharge: 2015-08-16 | Disposition: A | Discharge status: Home / Self Care | Payer: BLUE CROSS/BLUE SHIELD | Source: Ambulatory Visit | Attending: General Surgery | Admitting: General Surgery

## 2015-08-16 ENCOUNTER — Telehealth: Payer: Self-pay | Admitting: Hematology and Oncology

## 2015-08-16 ENCOUNTER — Other Ambulatory Visit: Payer: Self-pay | Admitting: *Deleted

## 2015-08-16 DIAGNOSIS — C50411 Malignant neoplasm of upper-outer quadrant of right female breast: Secondary | ICD-10-CM

## 2015-08-16 NOTE — Telephone Encounter (Signed)
s.w. pt and advised on OCT appt....pt ok and aware °

## 2015-08-18 ENCOUNTER — Encounter (HOSPITAL_BASED_OUTPATIENT_CLINIC_OR_DEPARTMENT_OTHER): Payer: Self-pay | Admitting: *Deleted

## 2015-08-18 ENCOUNTER — Encounter (HOSPITAL_BASED_OUTPATIENT_CLINIC_OR_DEPARTMENT_OTHER)
Admission: RE | Disposition: A | Discharge status: Home / Self Care | Payer: Self-pay | Source: Ambulatory Visit | Attending: General Surgery

## 2015-08-18 ENCOUNTER — Ambulatory Visit (HOSPITAL_COMMUNITY)
Admission: RE | Admit: 2015-08-18 | Discharge: 2015-08-18 | Disposition: A | Discharge status: Home / Self Care | Payer: BLUE CROSS/BLUE SHIELD | Source: Ambulatory Visit | Attending: General Surgery | Admitting: General Surgery

## 2015-08-18 ENCOUNTER — Ambulatory Visit (HOSPITAL_BASED_OUTPATIENT_CLINIC_OR_DEPARTMENT_OTHER): Payer: BLUE CROSS/BLUE SHIELD | Admitting: Anesthesiology

## 2015-08-18 ENCOUNTER — Ambulatory Visit
Admission: RE | Admit: 2015-08-18 | Discharge: 2015-08-18 | Disposition: A | Discharge status: Home / Self Care | Payer: BLUE CROSS/BLUE SHIELD | Source: Ambulatory Visit | Attending: General Surgery | Admitting: General Surgery

## 2015-08-18 ENCOUNTER — Ambulatory Visit (HOSPITAL_BASED_OUTPATIENT_CLINIC_OR_DEPARTMENT_OTHER)
Admission: RE | Admit: 2015-08-18 | Discharge: 2015-08-18 | Disposition: A | Discharge status: Home / Self Care | Payer: BLUE CROSS/BLUE SHIELD | Source: Ambulatory Visit | Attending: General Surgery | Admitting: General Surgery

## 2015-08-18 DIAGNOSIS — Z87891 Personal history of nicotine dependence: Secondary | ICD-10-CM | POA: Insufficient documentation

## 2015-08-18 DIAGNOSIS — Z803 Family history of malignant neoplasm of breast: Secondary | ICD-10-CM | POA: Insufficient documentation

## 2015-08-18 DIAGNOSIS — C50411 Malignant neoplasm of upper-outer quadrant of right female breast: Secondary | ICD-10-CM

## 2015-08-18 DIAGNOSIS — I10 Essential (primary) hypertension: Secondary | ICD-10-CM | POA: Diagnosis not present

## 2015-08-18 DIAGNOSIS — J449 Chronic obstructive pulmonary disease, unspecified: Secondary | ICD-10-CM | POA: Diagnosis not present

## 2015-08-18 DIAGNOSIS — D0511 Intraductal carcinoma in situ of right breast: Secondary | ICD-10-CM | POA: Diagnosis not present

## 2015-08-18 HISTORY — DX: Gastro-esophageal reflux disease without esophagitis: K21.9

## 2015-08-18 HISTORY — PX: RADIOACTIVE SEED GUIDED PARTIAL MASTECTOMY WITH AXILLARY SENTINEL LYMPH NODE BIOPSY: SHX6520

## 2015-08-18 SURGERY — RADIOACTIVE SEED GUIDED PARTIAL MASTECTOMY WITH AXILLARY SENTINEL LYMPH NODE BIOPSY
Anesthesia: Regional | Site: Breast | Laterality: Right

## 2015-08-18 MED ORDER — MIDAZOLAM HCL 2 MG/2ML IJ SOLN
1.0000 mg | INTRAMUSCULAR | Status: DC | PRN
  Administered 2015-08-18: 1 mg via INTRAVENOUS
  Administered 2015-08-18: 2 mg via INTRAVENOUS

## 2015-08-18 MED ORDER — ONDANSETRON HCL 4 MG/2ML IJ SOLN
INTRAMUSCULAR | Status: DC | PRN
Start: 2015-08-18 — End: 2015-08-18
  Administered 2015-08-18: 4 mg via INTRAVENOUS

## 2015-08-18 MED ORDER — BUPIVACAINE HCL (PF) 0.25 % IJ SOLN
INTRAMUSCULAR | Status: AC
  Filled 2015-08-18: qty 30

## 2015-08-18 MED ORDER — LIDOCAINE-EPINEPHRINE (PF) 1 %-1:200000 IJ SOLN
INTRAMUSCULAR | Status: DC | PRN
Start: 2015-08-18 — End: 2015-08-18
  Administered 2015-08-18: 15 mL

## 2015-08-18 MED ORDER — MEPERIDINE HCL 25 MG/ML IJ SOLN: 6.2500 mg | INTRAMUSCULAR | Status: DC | PRN

## 2015-08-18 MED ORDER — FENTANYL CITRATE (PF) 100 MCG/2ML IJ SOLN
INTRAMUSCULAR | Status: AC
  Filled 2015-08-18: qty 2

## 2015-08-18 MED ORDER — SODIUM CHLORIDE 0.9 % IJ SOLN
INTRAMUSCULAR | Status: DC | PRN
  Administered 2015-08-18: 14:00:00

## 2015-08-18 MED ORDER — PROPOFOL 500 MG/50ML IV EMUL
INTRAVENOUS | Status: AC
  Filled 2015-08-18: qty 50

## 2015-08-18 MED ORDER — ACETAMINOPHEN 650 MG RE SUPP: 650.0000 mg | RECTAL | Status: DC | PRN

## 2015-08-18 MED ORDER — METHYLENE BLUE 1 % INJ SOLN
INTRAMUSCULAR | Status: AC
  Filled 2015-08-18: qty 10

## 2015-08-18 MED ORDER — TECHNETIUM TC 99M SULFUR COLLOID FILTERED
1.0000 | Freq: Once | INTRAVENOUS | Status: AC | PRN
  Administered 2015-08-18: 1 via INTRADERMAL

## 2015-08-18 MED ORDER — GLYCOPYRROLATE 0.2 MG/ML IJ SOLN: 0.2000 mg | Freq: Once | INTRAMUSCULAR | Status: DC | PRN

## 2015-08-18 MED ORDER — MIDAZOLAM HCL 2 MG/2ML IJ SOLN
INTRAMUSCULAR | Status: AC
  Filled 2015-08-18: qty 4

## 2015-08-18 MED ORDER — SODIUM CHLORIDE 0.9 % IV SOLN: 250.0000 mL | INTRAVENOUS | Status: DC | PRN

## 2015-08-18 MED ORDER — PHENYLEPHRINE HCL 10 MG/ML IJ SOLN
INTRAMUSCULAR | Status: AC
Start: 2015-08-18 — End: 2015-08-18
  Filled 2015-08-18: qty 1

## 2015-08-18 MED ORDER — CEFAZOLIN SODIUM 1-5 GM-% IV SOLN
INTRAVENOUS | Status: DC | PRN
  Administered 2015-08-18: 2 g via INTRAVENOUS

## 2015-08-18 MED ORDER — LACTATED RINGERS IV SOLN
INTRAVENOUS | Status: DC
  Administered 2015-08-18 (×2): via INTRAVENOUS

## 2015-08-18 MED ORDER — MIDAZOLAM HCL 2 MG/2ML IJ SOLN
INTRAMUSCULAR | Status: AC
  Filled 2015-08-18: qty 2

## 2015-08-18 MED ORDER — OXYCODONE HCL 5 MG/5ML PO SOLN: 5.0000 mg | Freq: Once | ORAL | Status: DC | PRN

## 2015-08-18 MED ORDER — CEFAZOLIN SODIUM-DEXTROSE 2-3 GM-% IV SOLR
INTRAVENOUS | Status: AC
  Filled 2015-08-18: qty 50

## 2015-08-18 MED ORDER — SODIUM CHLORIDE 0.9 % IJ SOLN
INTRAMUSCULAR | Status: AC
  Filled 2015-08-18: qty 10

## 2015-08-18 MED ORDER — DEXAMETHASONE SODIUM PHOSPHATE 4 MG/ML IJ SOLN
INTRAMUSCULAR | Status: DC | PRN
  Administered 2015-08-18: 10 mg via INTRAVENOUS

## 2015-08-18 MED ORDER — DEXAMETHASONE SODIUM PHOSPHATE 10 MG/ML IJ SOLN
INTRAMUSCULAR | Status: AC
  Filled 2015-08-18: qty 1

## 2015-08-18 MED ORDER — ONDANSETRON HCL 4 MG/2ML IJ SOLN
INTRAMUSCULAR | Status: AC
  Filled 2015-08-18: qty 2

## 2015-08-18 MED ORDER — SODIUM CHLORIDE 0.9 % IJ SOLN: 3.0000 mL | INTRAMUSCULAR | Status: DC | PRN

## 2015-08-18 MED ORDER — OXYCODONE HCL 5 MG PO TABS: 5.0000 mg | ORAL_TABLET | ORAL | Status: DC | PRN

## 2015-08-18 MED ORDER — EPHEDRINE SULFATE 50 MG/ML IJ SOLN
INTRAMUSCULAR | Status: DC | PRN
  Administered 2015-08-18: 10 mg via INTRAVENOUS

## 2015-08-18 MED ORDER — BUPIVACAINE-EPINEPHRINE (PF) 0.5% -1:200000 IJ SOLN
INTRAMUSCULAR | Status: DC | PRN
  Administered 2015-08-18: 25 mL via PERINEURAL

## 2015-08-18 MED ORDER — HYDROMORPHONE HCL 1 MG/ML IJ SOLN
INTRAMUSCULAR | Status: AC
Start: 2015-08-18 — End: 2015-08-18
  Filled 2015-08-18: qty 1

## 2015-08-18 MED ORDER — SUCCINYLCHOLINE CHLORIDE 20 MG/ML IJ SOLN
INTRAMUSCULAR | Status: AC
  Filled 2015-08-18: qty 1

## 2015-08-18 MED ORDER — FENTANYL CITRATE (PF) 100 MCG/2ML IJ SOLN
INTRAMUSCULAR | Status: AC
  Filled 2015-08-18: qty 4

## 2015-08-18 MED ORDER — SODIUM CHLORIDE 0.9 % IJ SOLN: 3.0000 mL | Freq: Two times a day (BID) | INTRAMUSCULAR | Status: DC

## 2015-08-18 MED ORDER — LIDOCAINE HCL (CARDIAC) 20 MG/ML IV SOLN
INTRAVENOUS | Status: DC | PRN
  Administered 2015-08-18: 80 mg via INTRAVENOUS

## 2015-08-18 MED ORDER — LIDOCAINE HCL (CARDIAC) 20 MG/ML IV SOLN
INTRAVENOUS | Status: AC
  Filled 2015-08-18: qty 5

## 2015-08-18 MED ORDER — SCOPOLAMINE 1 MG/3DAYS TD PT72
MEDICATED_PATCH | TRANSDERMAL | Status: AC
  Filled 2015-08-18: qty 1

## 2015-08-18 MED ORDER — PROPOFOL 10 MG/ML IV BOLUS
INTRAVENOUS | Status: DC | PRN
  Administered 2015-08-18: 200 mg via INTRAVENOUS

## 2015-08-18 MED ORDER — OXYCODONE-ACETAMINOPHEN 5-325 MG PO TABS: 1.0000 | ORAL_TABLET | ORAL | Status: DC | PRN

## 2015-08-18 MED ORDER — CEFAZOLIN SODIUM-DEXTROSE 2-3 GM-% IV SOLR
2.0000 g | INTRAVENOUS | Status: AC
  Administered 2015-08-18: 2 g via INTRAVENOUS

## 2015-08-18 MED ORDER — SCOPOLAMINE 1 MG/3DAYS TD PT72
1.0000 | MEDICATED_PATCH | Freq: Once | TRANSDERMAL | Status: DC | PRN
  Administered 2015-08-18: 1.5 mg via TRANSDERMAL

## 2015-08-18 MED ORDER — OXYCODONE HCL 5 MG PO TABS: 5.0000 mg | ORAL_TABLET | Freq: Once | ORAL | Status: DC | PRN

## 2015-08-18 MED ORDER — PHENYLEPHRINE HCL 10 MG/ML IJ SOLN
INTRAMUSCULAR | Status: DC | PRN
  Administered 2015-08-18: 40 ug via INTRAVENOUS
  Administered 2015-08-18: 80 ug via INTRAVENOUS

## 2015-08-18 MED ORDER — HYDROMORPHONE HCL 1 MG/ML IJ SOLN
0.2500 mg | INTRAMUSCULAR | Status: DC | PRN
  Administered 2015-08-18 (×2): 0.5 mg via INTRAVENOUS

## 2015-08-18 MED ORDER — ACETAMINOPHEN 325 MG PO TABS: 650.0000 mg | ORAL_TABLET | ORAL | Status: DC | PRN

## 2015-08-18 MED ORDER — FENTANYL CITRATE (PF) 100 MCG/2ML IJ SOLN
50.0000 ug | INTRAMUSCULAR | Status: DC | PRN
  Administered 2015-08-18: 100 ug via INTRAVENOUS
  Administered 2015-08-18: 50 ug via INTRAVENOUS

## 2015-08-18 SURGICAL SUPPLY — 67 items
ADH SKN CLS APL DERMABOND .7 (GAUZE/BANDAGES/DRESSINGS) ×1
APPLIER CLIP 9.375 MED OPEN (MISCELLANEOUS)
APR CLP MED 9.3 20 MLT OPN (MISCELLANEOUS)
BINDER BREAST LRG (GAUZE/BANDAGES/DRESSINGS) IMPLANT
BINDER BREAST MEDIUM (GAUZE/BANDAGES/DRESSINGS) IMPLANT
BINDER BREAST XLRG (GAUZE/BANDAGES/DRESSINGS) ×2 IMPLANT
BINDER BREAST XXLRG (GAUZE/BANDAGES/DRESSINGS) IMPLANT
BLADE HEX COATED 2.75 (ELECTRODE) ×2 IMPLANT
BLADE SURG 10 STRL SS (BLADE) ×2 IMPLANT
BLADE SURG 15 STRL LF DISP TIS (BLADE) ×1 IMPLANT
BLADE SURG 15 STRL SS (BLADE) ×2
BNDG COHESIVE 4X5 TAN STRL (GAUZE/BANDAGES/DRESSINGS) ×2 IMPLANT
CANISTER SUC SOCK COL 7IN (MISCELLANEOUS) IMPLANT
CANISTER SUCT 1200ML W/VALVE (MISCELLANEOUS) ×2 IMPLANT
CHLORAPREP W/TINT 26ML (MISCELLANEOUS) ×2 IMPLANT
CLIP APPLIE 9.375 MED OPEN (MISCELLANEOUS) IMPLANT
CLIP TI LARGE 6 (CLIP) ×4 IMPLANT
CLIP TI MEDIUM 6 (CLIP) ×4 IMPLANT
CLIP TI WIDE RED SMALL 6 (CLIP) IMPLANT
COVER MAYO STAND STRL (DRAPES) ×2 IMPLANT
COVER PROBE W GEL 5X96 (DRAPES) ×2 IMPLANT
DECANTER SPIKE VIAL GLASS SM (MISCELLANEOUS) IMPLANT
DERMABOND ADVANCED (GAUZE/BANDAGES/DRESSINGS) ×1
DERMABOND ADVANCED .7 DNX12 (GAUZE/BANDAGES/DRESSINGS) ×1 IMPLANT
DEVICE DUBIN W/COMP PLATE 8390 (MISCELLANEOUS) ×2 IMPLANT
DRAPE UTILITY XL STRL (DRAPES) ×2 IMPLANT
DRSG PAD ABDOMINAL 8X10 ST (GAUZE/BANDAGES/DRESSINGS) IMPLANT
ELECT REM PT RETURN 9FT ADLT (ELECTROSURGICAL) ×2
ELECTRODE REM PT RTRN 9FT ADLT (ELECTROSURGICAL) ×1 IMPLANT
GLOVE BIO SURGEON STRL SZ 6 (GLOVE) ×2 IMPLANT
GLOVE BIO SURGEON STRL SZ 6.5 (GLOVE) ×2 IMPLANT
GLOVE BIOGEL PI IND STRL 6.5 (GLOVE) ×1 IMPLANT
GLOVE BIOGEL PI INDICATOR 6.5 (GLOVE) ×1
GLOVE SURG SS PI 7.0 STRL IVOR (GLOVE) ×1 IMPLANT
GOWN STRL REUS W/ TWL LRG LVL3 (GOWN DISPOSABLE) ×1 IMPLANT
GOWN STRL REUS W/TWL 2XL LVL3 (GOWN DISPOSABLE) ×2 IMPLANT
GOWN STRL REUS W/TWL LRG LVL3 (GOWN DISPOSABLE) ×2
KIT MARKER MARGIN INK (KITS) ×2 IMPLANT
LIGHT WAVEGUIDE WIDE FLAT (MISCELLANEOUS) ×2 IMPLANT
NDL HYPO 25X1 1.5 SAFETY (NEEDLE) ×1 IMPLANT
NDL SAFETY ECLIPSE 18X1.5 (NEEDLE) IMPLANT
NEEDLE HYPO 18GX1.5 SHARP (NEEDLE)
NEEDLE HYPO 25X1 1.5 SAFETY (NEEDLE) ×2 IMPLANT
NS IRRIG 1000ML POUR BTL (IV SOLUTION) IMPLANT
PACK BASIN DAY SURGERY FS (CUSTOM PROCEDURE TRAY) ×2 IMPLANT
PACK UNIVERSAL I (CUSTOM PROCEDURE TRAY) ×2 IMPLANT
PENCIL BUTTON HOLSTER BLD 10FT (ELECTRODE) ×2 IMPLANT
SLEEVE SCD COMPRESS KNEE MED (MISCELLANEOUS) ×2 IMPLANT
SPONGE GAUZE 4X4 12PLY STER LF (GAUZE/BANDAGES/DRESSINGS) ×2 IMPLANT
SPONGE LAP 18X18 X RAY DECT (DISPOSABLE) ×2 IMPLANT
STAPLER VISISTAT 35W (STAPLE) IMPLANT
STOCKINETTE IMPERVIOUS LG (DRAPES) ×2 IMPLANT
STRIP CLOSURE SKIN 1/2X4 (GAUZE/BANDAGES/DRESSINGS) ×2 IMPLANT
SUT ETHILON 2 0 FS 18 (SUTURE) IMPLANT
SUT MNCRL AB 4-0 PS2 18 (SUTURE) ×2 IMPLANT
SUT MON AB 5-0 PS2 18 (SUTURE) IMPLANT
SUT SILK 2 0 SH (SUTURE) IMPLANT
SUT VIC AB 2-0 SH 27 (SUTURE) ×2
SUT VIC AB 2-0 SH 27XBRD (SUTURE) ×1 IMPLANT
SUT VIC AB 3-0 SH 27 (SUTURE) ×2
SUT VIC AB 3-0 SH 27X BRD (SUTURE) ×1 IMPLANT
SUT VIC AB 5-0 PS2 18 (SUTURE) IMPLANT
SYR CONTROL 10ML LL (SYRINGE) ×2 IMPLANT
TOWEL OR 17X24 6PK STRL BLUE (TOWEL DISPOSABLE) ×2 IMPLANT
TOWEL OR NON WOVEN STRL DISP B (DISPOSABLE) ×2 IMPLANT
TUBE CONNECTING 20X1/4 (TUBING) ×2 IMPLANT
YANKAUER SUCT BULB TIP NO VENT (SUCTIONS) IMPLANT

## 2015-08-18 NOTE — Progress Notes (Signed)
Assisted Dr. Crews with right, ultrasound guided, pectoralis block. Side rails up, monitors on throughout procedure. See vital signs in flow sheet. Tolerated Procedure well. 

## 2015-08-18 NOTE — Discharge Instructions (Addendum)
[facility] °Office Phone Number [phone] ° °BREAST BIOPSY/ PARTIAL MASTECTOMY: POST OP INSTRUCTIONS ° °Always review your discharge instruction sheet given to you by the facility where your surgery was performed. ° °IF YOU HAVE DISABILITY OR FAMILY LEAVE FORMS, YOU MUST BRING THEM TO THE OFFICE FOR PROCESSING.  DO NOT GIVE THEM TO YOUR DOCTOR. ° °1. A prescription for pain medication may be given to you upon discharge.  Take your pain medication as prescribed, if needed.  If narcotic pain medicine is not needed, then you may take acetaminophen (Tylenol) or ibuprofen (Advil) as needed. °2. Take your usually prescribed medications unless otherwise directed °3. If you need a refill on your pain medication, please contact your pharmacy.  They will contact our office to request authorization.  Prescriptions will not be filled after 5pm or on week-ends. °4. You should eat very light the first 24 hours after surgery, such as soup, crackers, pudding, etc.  Resume your normal diet the day after surgery. °5. Most patients will experience some swelling and bruising in the breast.  Ice packs and a good support bra will help.  Swelling and bruising can take several days to resolve.  °6. It is common to experience some constipation if taking pain medication after surgery.  Increasing fluid intake and taking a stool softener will usually help or prevent this problem from occurring.  A mild laxative (Milk of Magnesia or Miralax) should be taken according to package directions if there are no bowel movements after 48 hours. °7. Unless discharge instructions indicate otherwise, you may remove your bandages 48 hours after surgery, and you may shower at that time.  You may have steri-strips (small skin tapes) in place directly over the incision.  These strips should be left on the skin for 7-10 days.   Any sutures or staples will be removed at the office during your follow-up visit. °8. ACTIVITIES:  You may resume  regular daily activities (gradually increasing) beginning the next day.  Wearing a good support bra or sports bra (or the breast binder) minimizes pain and swelling.  You may have sexual intercourse when it is comfortable. °a. You may drive when you no longer are taking prescription pain medication, you can comfortably wear a seatbelt, and you can safely maneuver your car and apply brakes. °b. RETURN TO WORK:  __________1 week_______________ °9. You should see your doctor in the office for a follow-up appointment approximately two weeks after your surgery.  Your doctor’s nurse will typically make your follow-up appointment when she calls you with your pathology report.  Expect your pathology report 2-3 business days after your surgery.  You may call to check if you do not hear from us after three days. ° ° °WHEN TO CALL YOUR DOCTOR: °1. Fever over 101.0 °2. Nausea and/or vomiting. °3. Extreme swelling or bruising. °4. Continued bleeding from incision. °5. Increased pain, redness, or drainage from the incision. ° °The clinic staff is available to answer your questions during regular business hours.  Please don’t hesitate to call and ask to speak to one of the nurses for clinical concerns.  If you have a medical emergency, go to the nearest emergency room or call 911.  A surgeon from [facility] is always on call at the hospital. ° °For further questions, please visit [email or web address]  ° ° °Post Anesthesia Home Care Instructions ° °Activity: °Get plenty of rest for the remainder of the day. A responsible adult should stay with you for 24   hours following the procedure.  °For the next 24 hours, DO NOT: °-Drive a car °-Operate machinery °-Drink alcoholic beverages °-Take any medication unless instructed by your physician °-Make any legal decisions or sign important papers. ° °Meals: °Start with liquid foods such as gelatin or soup. Progress to regular foods as tolerated. Avoid greasy, spicy, heavy  foods. If nausea and/or vomiting occur, drink only clear liquids until the nausea and/or vomiting subsides. Call your physician if vomiting continues. ° °Special Instructions/Symptoms: °Your throat may feel dry or sore from the anesthesia or the breathing tube placed in your throat during surgery. If this causes discomfort, gargle with warm salt water. The discomfort should disappear within 24 hours. ° °If you had a scopolamine patch placed behind your ear for the management of post- operative nausea and/or vomiting: ° °1. The medication in the patch is effective for 72 hours, after which it should be removed.  Wrap patch in a tissue and discard in the trash. Wash hands thoroughly with soap and water. °2. You may remove the patch earlier than 72 hours if you experience unpleasant side effects which may include dry mouth, dizziness or visual disturbances. °3. Avoid touching the patch. Wash your hands with soap and water after contact with the patch. °  ° °

## 2015-08-18 NOTE — Op Note (Signed)
Right Breast Radioactive seed localized lumpectomy and sentinel lymph node biopsy  Indications: This patient presents with history of right breast cancer  Pre-operative Diagnosis: [id number] right breast cancer, upper outer quadrant  Post-operative Diagnosis: Same  Surgeon: Stark Klein   Anesthesia: General endotracheal anesthesia  ASA Class: 2  Procedure Details  The patient was seen in the Holding Room. The risks, benefits, complications, treatment options, and expected outcomes were discussed with the patient. The possibilities of bleeding, infection, the need for additional procedures, failure to diagnose a condition, and creating a complication requiring transfusion or operation were discussed with the patient. The patient concurred with the proposed plan, giving informed consent.  The site of surgery properly noted/marked. The patient was taken to Operating Room # 1, identified, and the procedure verified as Right Breast Seed localized partial mastectomy and sentinel lymph node biopsy. A Time Out was held and the above information confirmed.  The right arm, breast, and chest were prepped and draped in standard fashion. The lumpectomy was performed by creating an transverse incision over the upper outer quadrant of the breast over the previously placed radioactive seed.  Dissection was carried down to around the point of maximum signal intensity. The cautery was used to perform the dissection.  Hemostasis was achieved with cautery. The edges of the cavity were marked with large clips, with one each medial, lateral, inferior and superior, and two clips posteriorly.   The specimen was inked with the margin marker paint kit.    Specimen radiography confirmed inclusion of the mammographic lesion, the clip, and the seed.  The background signal in the breast was zero.  The wound was irrigated and closed with 3-0 vicryl in layers and 4-0 monocryl subcuticular suture.    Using a hand-held gamma probe,  right axillary sentinel nodes were identified transcutaneously.  An oblique incision was created below the axillary hairline.  Dissection was carried through the clavipectoral fascia.  Two level 2 axillary sentinel nodes were removed.  Counts per second were 91 and 199.    The background count was 14 cps.  The wound was irrigated.  Hemostasis was achieved with cautery.  The axillary incision was closed with a 3-0 vicryl deep dermal interrupted sutures and a 4-0 monocryl subcuticular closure.    Sterile dressings were applied. At the end of the operation, all sponge, instrument, and needle counts were correct.  Findings: grossly clear surgical margins and no adenopathy, posterior margin is pectoralis, lateral and anterior margins are skin  Estimated Blood Loss:  min         Specimens: right breast partial mastectomy and 2 right axillary sentinel lymph nodes.             Complications:  None; patient tolerated the procedure well.         Disposition: PACU - hemodynamically stable.         Condition: stable

## 2015-08-18 NOTE — Transfer of Care (Signed)
Immediate Anesthesia Transfer of Care Note  Patient: [name]  Procedure(s) Performed: Procedure(s): RADIOACTIVE SEED GUIDED PARTIAL MASTECTOMY WITH AXILLARY SENTINEL LYMPH NODE BIOPSY (Right)  Patient Location: PACU  Anesthesia Type:General  Level of Consciousness: sedated  Airway & Oxygen Therapy: Patient Spontanous Breathing and Patient connected to face mask oxygen  Post-op Assessment: Report given to RN and Post -op Vital signs reviewed and stable  Post vital signs: Reviewed and stable  Last Vitals:  Filed Vitals:   08/18/15 1305  BP: 146/79  Pulse:   Temp:   Resp: 11    Complications: No apparent anesthesia complications

## 2015-08-18 NOTE — Anesthesia Procedure Notes (Addendum)
Anesthesia Regional Block:  Pectoralis block  Pre-Anesthetic Checklist: ,, timeout performed, Correct Patient, Correct Site, Correct Laterality, Correct Procedure, Correct Position, site marked, Risks and benefits discussed,  Surgical consent,  Pre-op evaluation,  At surgeon's request and post-op pain management  Laterality: Right and Upper  Prep: chloraprep       Needles:  Injection technique: Single-shot  Needle Type: Echogenic Needle     Needle Length: 9cm 9 cm Needle Gauge: 21 and 21 G    Additional Needles:  Procedures: ultrasound guided (picture in chart) Pectoralis block Narrative:  Start time: 08/18/2015 12:56 PM End time: 08/18/2015 1:01 PM Injection made incrementally with aspirations every 5 mL.  Performed by: Personally  Anesthesiologist: CREWS, DAVID   Procedure Name: LMA Insertion Date/Time: 08/18/2015 2:08 PM Performed by: Maryella Shivers Pre-anesthesia Checklist: Patient identified, Emergency Drugs available, Suction available and Patient being monitored Patient Re-evaluated:Patient Re-evaluated prior to inductionOxygen Delivery Method: Circle System Utilized Preoxygenation: Pre-oxygenation with 100% oxygen Intubation Type: IV induction Ventilation: Mask ventilation without difficulty LMA: LMA inserted LMA Size: 4.0 Number of attempts: 1 Airway Equipment and Method: Bite block Placement Confirmation: positive ETCO2 Tube secured with: Tape Dental Injury: Teeth and Oropharynx as per pre-operative assessment

## 2015-08-18 NOTE — Anesthesia Postprocedure Evaluation (Signed)
  Anesthesia Post-op Note  Patient: [name]  Procedure(s) Performed: Procedure(s): RADIOACTIVE SEED GUIDED PARTIAL MASTECTOMY WITH AXILLARY SENTINEL LYMPH NODE BIOPSY (Right)  Patient Location: PACU  Anesthesia Type: General, Regional   Level of Consciousness: awake, alert  and oriented  Airway and Oxygen Therapy: Patient Spontanous Breathing  Post-op Pain: mild  Post-op Assessment: Post-op Vital signs reviewed  Post-op Vital Signs: Reviewed  Last Vitals:  Filed Vitals:   08/18/15 1630  BP: 155/78  Pulse: 74  Temp:   Resp: 12    Complications: No apparent anesthesia complications

## 2015-08-18 NOTE — H&P (Signed)
[name] 08/02/2015 3:05 PM Location: [facility] Patient #: [id number] DOB: 24-Dec-1960 Single / Language: Cleophus Molt / Race: Black or African American Female  History of Present Illness Stark Klein MD; 08/15/2015 2:42 PM) The patient is a 54 year old female who presents with breast cancer. Patient is a 54 yo F who is referred by Dr. Enriqueta Shutter of [facility] and Dr. [name] from [facility] for a new diagnosis of right breast cancer. She presented with a screening detected calcifications in the right breast. She then got a diagnostic mammogram demonstrating an 11 mm area of suspicious calcs in the upper outer quadrant. Core needle biopsy was positive for invasive breast cancer.  She had menarche at age 35. First child was between age 27 and 22, and she had 4 children. She used oral contraceptives. She has irregular periods, and is s/p hysterectomy. Mother had breast cancer.  Dx mammogram FINDINGS: Spot compression magnification views were performed over the upper outer far posterior right breast demonstrating grouped coarse heterogeneous calcifications spanning a distance of approximately 11 mm. Pathology Diagnosis Breast, right, needle core biopsy, upper outer - INVASIVE DUCTAL CARCINOMA. - DUCTAL CARCINOM IN SITU WITH CALCIFICATIONS. - LYMPHOVASCULAR INVASION IS IDENTIFIED. - SEE COMMENT   Other Problems Elbert Ewings, CMA; 08/02/2015 3:05 PM) Gastroesophageal Reflux Disease High blood pressure Other disease, cancer, significant illness  Past Surgical History Elbert Ewings, CMA; 08/02/2015 3:05 PM) Cesarean Section - 1 Hysterectomy (not due to cancer) - Partial Oral Surgery  Diagnostic Studies History Elbert Ewings, CMA; 08/02/2015 3:05 PM) Colonoscopy 5-10 years ago Mammogram within last year Pap Smear >5 years ago  Allergies Elbert Ewings, CMA; 08/02/2015 3:05 PM) Codeine Phosphate *ANALGESICS - OPIOID*  Medication History Elbert Ewings, CMA;  08/02/2015 3:07 PM) Albuterol Sulfate HFA (108 (90 Base)MCG/ACT Aerosol Soln, Inhalation) Active. Aspirin (81MG  Tablet, Oral) Active. Estrace (0.5MG  Tablet, Oral) Active. Flonase (50MCG/ACT Suspension, Nasal) Active. Hydrochlorothiazide (12.5MG  Tablet, Oral) Active. Antivert (25MG  Tablet, Oral) Active. Metoprolol Tartrate (25MG  Tablet, Oral) Active. Multiple Vitamin (Oral) Active. PriLOSEC (20MG  Capsule DR, Oral) Active. Symbicort (160-4.5MCG/ACT Aerosol, Inhalation) Active. Medications Reconciled  Social History Elbert Ewings, [place]; 08/02/2015 3:05 PM) Caffeine use Carbonated beverages, Coffee, Tea. No alcohol use No drug use Tobacco use Former smoker.  Family History Elbert Ewings, [place]; 08/02/2015 3:05 PM) Arthritis Mother. Bleeding disorder Daughter, Mother. Breast Cancer Mother. Depression Daughter. Hypertension Daughter, Father, Mother, Sister.  Pregnancy / Birth History Elbert Ewings, CMA; 08/02/2015 3:05 PM) Age at menarche 81 years. Contraceptive History Oral contraceptives. Gravida 3 Irregular periods Maternal age 90-20 Para 4  Review of Systems Elbert Ewings CMA; 08/02/2015 3:05 PM) General Present- Night Sweats. Not Present- Appetite Loss, Chills, Fatigue, Fever, Weight Gain and Weight Loss. Skin Present- Dryness. Not Present- Change in Wart/Mole, Hives, Jaundice, New Lesions, Non-Healing Wounds, Rash and Ulcer. HEENT Present- Seasonal Allergies and Wears glasses/contact lenses. Not Present- Earache, Hearing Loss, Hoarseness, Nose Bleed, Oral Ulcers, Ringing in the Ears, Sinus Pain, Sore Throat, Visual Disturbances and Yellow Eyes. Cardiovascular Not Present- Chest Pain, Difficulty Breathing Lying Down, Leg Cramps, Palpitations, Rapid Heart Rate, Shortness of Breath and Swelling of Extremities. Gastrointestinal Not Present- Abdominal Pain, Bloating, Bloody Stool, Change in Bowel Habits, Chronic diarrhea, Constipation, Difficulty Swallowing, Excessive  gas, Gets full quickly at meals, Hemorrhoids, Indigestion, Nausea, Rectal Pain and Vomiting. Female Genitourinary Not Present- Frequency, Nocturia, Painful Urination, Pelvic Pain and Urgency. Neurological Not Present- Decreased Memory, Fainting, Headaches, Numbness, Seizures, Tingling, Tremor, Trouble walking and Weakness. Endocrine Present- Hot flashes. Not  Present- Cold Intolerance, Excessive Hunger, Hair Changes, Heat Intolerance and New Diabetes. Hematology Not Present- Easy Bruising, Excessive bleeding, Gland problems, HIV and Persistent Infections.   Vitals Elbert Ewings CMA; 08/02/2015 3:08 PM) 08/02/2015 3:07 PM Weight: 176 lb Height: 65in Body Surface Area: 1.91 m Body Mass Index: 29.29 kg/m Temp.: 97.9F(Temporal)  Pulse: 80 (Regular)  BP: 128/80 (Sitting, Left Arm, Standard)    Physical Exam Stark Klein MD; 08/15/2015 2:43 PM) General Mental Status-Alert. General Appearance-Consistent with stated age. Hydration-Well hydrated. Voice-Normal.  Head and Neck Head-normocephalic, atraumatic with no lesions or palpable masses. Trachea-midline. Thyroid Gland Characteristics - normal size and consistency.  Eye Eyeball - Bilateral-Extraocular movements intact. Sclera/Conjunctiva - Bilateral-No scleral icterus.  Chest and Lung Exam Chest and lung exam reveals -quiet, even and easy respiratory effort with no use of accessory muscles and on auscultation, normal breath sounds, no adventitious sounds and normal vocal resonance. Inspection Chest Wall - Normal. Back - normal.  Breast Note: no palpable mass in either breast. No nipple retraction or discharge. No skin dimpling or erythema. No palpable LAD.   Cardiovascular Cardiovascular examination reveals -normal heart sounds, regular rate and rhythm with no murmurs and normal pedal pulses bilaterally.  Abdomen Inspection Inspection of the abdomen reveals - No  Hernias. Palpation/Percussion Palpation and Percussion of the abdomen reveal - Soft, Non Tender, No Rebound tenderness, No Rigidity (guarding) and No hepatosplenomegaly. Auscultation Auscultation of the abdomen reveals - Bowel sounds normal.  Neurologic Neurologic evaluation reveals -alert and oriented x 3 with no impairment of recent or remote memory. Mental Status-Normal.  Musculoskeletal Global Assessment -Note: no gross deformities.  Normal Exam - Left-Upper Extremity Strength Normal and Lower Extremity Strength Normal. Normal Exam - Right-Upper Extremity Strength Normal and Lower Extremity Strength Normal.  Lymphatic Head & Neck  General Head & Neck Lymphatics: Bilateral - Description - Normal. Axillary  General Axillary Region: Bilateral - Description - Normal. Tenderness - Non Tender. Femoral & Inguinal  Generalized Femoral & Inguinal Lymphatics: Bilateral - Description - No Generalized lymphadenopathy.    Assessment & Plan Stark Klein MD; 08/15/2015 2:45 PM) CARCINOMA OF UPPER-OUTER QUADRANT OF FEMALE BREAST, RIGHT (C50.411) Impression: Pt has new diagnosis of cT1cN0 right breast cancer. We will treat this with a seed localized lumpectomy and sentinel lymph node biopsy. She will need referrals to oncology and radiation oncology.  The surgical procedure was described to the patient. I discussed the incision type and location and that we would need radiology involved on with a seed marker and sentinel node mapping injection.  The risks and benefits of the procedure were described to the patient and she wishes to proceed.  We discussed the risks bleeding, infection, damage to other structures, need for further procedures/surgeries. We discussed the risk of seroma. The patient was advised that we may need to go back to surgery for additional tissue to obtain negative margins or for additional lymph nodes. The patient was advised that these are the most common  complications, but that others can occur as well. They were advised against taking aspirin or other anti-inflammatory agents/blood thinners the week before surgery. Current Plans  You are being scheduled for surgery - Our schedulers will call you.  You should hear from our office's scheduling department within 5 working days about the location, date, and time of surgery. We try to make accommodations for patient's preferences in scheduling surgery, but sometimes the OR schedule or the surgeon's schedule prevents [place] from making those accommodations.  If you have not  heard from our office [phone]) in 5 working days, call the office and ask for your surgeon's nurse.  If you have other questions about your diagnosis, plan, or surgery, call the office and ask for your surgeon's nurse. Pt Education - flb breast cancer surgery: discussed with patient and provided information. Referred to Radiation Oncology, for evaluation and follow up (Radiation Oncology). Referred to Oncology, for evaluation and follow up (Oncology).   Signed by Stark Klein, MD (08/15/2015 2:45 PM)

## 2015-08-18 NOTE — Interval H&P Note (Signed)
History and Physical Interval Note:  08/18/2015 1:46 PM  [name]  has presented today for surgery, with the diagnosis of RIGHT BREAST CANCER  The various methods of treatment have been discussed with the patient and family. After consideration of risks, benefits and other options for treatment, the patient has consented to  Procedure(s): RADIOACTIVE SEED GUIDED PARTIAL MASTECTOMY WITH AXILLARY SENTINEL LYMPH NODE BIOPSY (Right) as a surgical intervention .  The patient's history has been reviewed, patient examined, no change in status, stable for surgery.  I have reviewed the patient's chart and labs.  Questions were answered to the patient's satisfaction.     [name]

## 2015-08-18 NOTE — Anesthesia Preprocedure Evaluation (Signed)
Anesthesia Evaluation  Patient identified by MRN, date of birth, ID band Patient awake    Reviewed: Allergy & Precautions, NPO status , Patient's Chart, lab work & pertinent test results  Airway Mallampati: I  TM Distance: >3 FB Neck ROM: Full    Dental  (+) Teeth Intact, Dental Advisory Given   Pulmonary COPD,  COPD inhaler, former smoker,    breath sounds clear to auscultation       Cardiovascular hypertension, Pt. on medications  Rhythm:Regular Rate:Normal     Neuro/Psych    GI/Hepatic GERD  Medicated and Controlled,  Endo/Other    Renal/GU      Musculoskeletal   Abdominal   Peds  Hematology   Anesthesia Other Findings   Reproductive/Obstetrics                             Anesthesia Physical Anesthesia Plan  ASA: II  Anesthesia Plan: General and Regional   Post-op Pain Management:    Induction: Intravenous  Airway Management Planned: LMA  Additional Equipment:   Intra-op Plan:   Post-operative Plan: Extubation in OR  Informed Consent: I have reviewed the patients History and Physical, chart, labs and discussed the procedure including the risks, benefits and alternatives for the proposed anesthesia with the patient or authorized representative who has indicated his/her understanding and acceptance.   Dental advisory given  Plan Discussed with: CRNA, Anesthesiologist and Surgeon  Anesthesia Plan Comments:         Anesthesia Quick Evaluation

## 2015-08-19 ENCOUNTER — Encounter (HOSPITAL_BASED_OUTPATIENT_CLINIC_OR_DEPARTMENT_OTHER): Payer: Self-pay | Admitting: General Surgery

## 2015-08-20 ENCOUNTER — Telehealth: Payer: Self-pay | Admitting: Hematology and Oncology

## 2015-08-20 ENCOUNTER — Encounter: Payer: Self-pay | Admitting: *Deleted

## 2015-08-20 NOTE — Telephone Encounter (Signed)
s.w. pt and advised on OCT 19 moved to 10.14 .Marland Kitchen..pt ok and aware

## 2015-08-20 NOTE — Progress Notes (Signed)
Quick Note:  Please let patient know that margins and LN are negative! And make sure she has a follow up appt. tx FB ______

## 2015-08-25 ENCOUNTER — Telehealth: Payer: Self-pay | Admitting: Internal Medicine

## 2015-08-25 MED ORDER — BUDESONIDE-FORMOTEROL FUMARATE 160-4.5 MCG/ACT IN AERO: 2.0000 | INHALATION_SPRAY | Freq: Two times a day (BID) | RESPIRATORY_TRACT | Status: DC

## 2015-08-25 NOTE — Telephone Encounter (Signed)
Left message for pt to call back  °

## 2015-08-25 NOTE — Telephone Encounter (Signed)
Last seen on 6.29.16 by MW No change in medications  Try to work up to a minimum of 30 minutes without stopping 3 x weekly on your bike  If you are satisfied with your treatment plan, let your doctor know and he/she can either refill your medications or you can return here when your prescription runs out.   If in any way you are not 100% satisfied, please tell us. If 100% better, tell your friends!  Pulmonary follow up is as needed - good luck!!!  Called and spoke with pt. Verified pharmacy was [facility] on [address].  Informed her that two samples of symbicort were at the front office at our [facility] location and that rx was sent to pharmacy Pt voiced understanding and had no further questions Nothing further needed, will sign off on message

## 2015-08-27 ENCOUNTER — Encounter: Payer: Self-pay | Admitting: Hematology and Oncology

## 2015-08-27 ENCOUNTER — Telehealth: Payer: Self-pay | Admitting: *Deleted

## 2015-08-27 ENCOUNTER — Ambulatory Visit (HOSPITAL_BASED_OUTPATIENT_CLINIC_OR_DEPARTMENT_OTHER): Payer: BLUE CROSS/BLUE SHIELD | Admitting: Hematology and Oncology

## 2015-08-27 VITALS — BP 133/67 | HR 70 | Temp 98.1°F | Resp 18 | Ht 65.0 in | Wt 174.0 lb

## 2015-08-27 DIAGNOSIS — C50411 Malignant neoplasm of upper-outer quadrant of right female breast: Secondary | ICD-10-CM | POA: Diagnosis not present

## 2015-08-27 DIAGNOSIS — N951 Menopausal and female climacteric states: Secondary | ICD-10-CM

## 2015-08-27 MED ORDER — VENLAFAXINE HCL ER 37.5 MG PO CP24: 37.5000 mg | ORAL_CAPSULE | Freq: Every day | ORAL | Status: DC

## 2015-08-27 NOTE — Assessment & Plan Note (Signed)
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 The final tumor size appeared to be much smaller than what we initially saw on the biopsy.  Recommendations: 1. Oncotype DX testing to determine if chemotherapy would be of any benefit followed by 2. Adjuvant radiation therapy followed by 3. Adjuvant antiestrogen therapy  Return to clinic based upon the Oncotype DX score.

## 2015-08-27 NOTE — Progress Notes (Signed)
Patient Care Team: Mariel Aloe, MD as PCP - General (Family Medicine)  DIAGNOSIS: No matching staging information was found for the patient.  SUMMARY OF ONCOLOGIC HISTORY:   Breast cancer of upper-outer quadrant of right female breast ([place])   07/08/2015 Mammogram Right breast calcifications 11 mm span   08/04/2015 Initial Diagnosis Afirm biopsy right breast: Invasive ductal carcinoma with DCIS with calcifications, lymphovascular invasion is identified, grade 2-3, ER 100%, PR 100% Ki-67 20%, HER-2 negative ratio 1.46D: T1b N0 stage I a clinical stage   08/18/2015 Surgery Right breast invasive ductal carcinoma, grade 2, 0.6 cm, with DCIS high-grade, margins negative, 0/2 lymph nodes negative, ER 100%, PR positive, HER-2 negative, Ki-67 20%, T1 BN 0 stage IA    CHIEF COMPLIANT:  Follow-up after right lumpectomy  INTERVAL HISTORY: [name] is a  54 year old with above-mentioned history of right-sided breast cancer treated with lumpectomy and is here today to discuss the pathology report. She is healing quite well from the effect of surgery. She has some numbness and soreness  In the axilla.   REVIEW OF SYSTEMS:   Constitutional: Denies fevers, chills or abnormal weight loss Eyes: Denies blurriness of vision Ears, nose, mouth, throat, and face: Denies mucositis or sore throat Respiratory: Denies cough, dyspnea or wheezes Cardiovascular: Denies palpitation, chest discomfort or lower extremity swelling Gastrointestinal:  Denies nausea, heartburn or change in bowel habits Skin: Denies abnormal skin rashes Lymphatics: Denies new lymphadenopathy or easy bruising Neurological:Denies numbness, tingling or new weaknesses Behavioral/Psych: Mood is stable, no new changes  Breast:   Soreness from recent surgery All other systems were reviewed with the patient and are negative.  I have reviewed the past medical history, past surgical history, social history and family history with the patient and  they are unchanged from previous note.  ALLERGIES:  is allergic to codeine.  MEDICATIONS:  Current Outpatient Prescriptions  Medication Sig Dispense Refill  . albuterol (PROVENTIL HFA;VENTOLIN HFA) 108 (90 BASE) MCG/ACT inhaler Inhale 2 puffs into the lungs every 4 (four) hours as needed for wheezing or shortness of breath. For shortness of breath or wheezing 1 Inhaler 1  . aspirin EC 81 MG EC tablet Take 1 tablet (81 mg total) by mouth daily. 30 tablet 11  . budesonide-formoterol (SYMBICORT) 160-4.5 MCG/ACT inhaler Inhale 2 puffs into the lungs 2 (two) times daily. 1 Inhaler 4  . fluticasone (FLONASE) 50 MCG/ACT nasal spray Place 2 sprays into both nostrils daily as needed for allergies. 16 g 11  . hydrochlorothiazide (HYDRODIURIL) 12.5 MG tablet Take 1 tablet (12.5 mg total) by mouth daily. 90 tablet 3  . meclizine (ANTIVERT) 25 MG tablet Take 1 tablet (25 mg total) by mouth 3 (three) times daily as needed for dizziness. (Patient not taking: Reported on 08/11/2015) 30 tablet 0  . metoprolol tartrate (LOPRESSOR) 25 MG tablet TAKE ONE TABLET BY MOUTH TWICE DAILY 60 tablet 5  . Multiple Vitamin (MULTIVITAMIN) capsule Take 1 capsule by mouth daily.    Marland Kitchen omeprazole (PRILOSEC) 20 MG capsule TAKE ONE CAPSULE BY MOUTH ONCE DAILY. TAKE CAPSULE 30- 60 MINUTES BEFORE YOUR FIRST AND LAST  MEALS OF THE DAY 60 capsule 3  . oxyCODONE-acetaminophen (ROXICET) 5-325 MG tablet Take 1-2 tablets by mouth every 4 (four) hours as needed for severe pain. 30 tablet 0   No current facility-administered medications for this visit.    PHYSICAL EXAMINATION: ECOG PERFORMANCE STATUS: 1 - Symptomatic but completely ambulatory  Filed Vitals:   08/27/15 1125  BP:  133/67  Pulse: 70  Temp: 98.1 F (36.7 C)  Resp: 18   Filed Weights   08/27/15 1125  Weight: 174 lb (78.926 kg)    GENERAL:alert, no distress and comfortable SKIN: skin color, texture, turgor are normal, no rashes or significant lesions EYES: normal,  Conjunctiva are pink and non-injected, sclera clear OROPHARYNX:no exudate, no erythema and lips, buccal mucosa, and tongue normal  NECK: supple, thyroid normal size, non-tender, without nodularity LYMPH:  no palpable lymphadenopathy in the cervical, axillary or inguinal LUNGS: clear to auscultation and percussion with normal breathing effort HEART: regular rate & rhythm and no murmurs and no lower extremity edema ABDOMEN:abdomen soft, non-tender and normal bowel sounds Musculoskeletal:no cyanosis of digits and no clubbing  NEURO: alert & oriented x 3 with fluent speech, no focal motor/sensory deficits  LABORATORY DATA:  I have reviewed the data as listed   Chemistry      Component Value Date/Time   NA 142 08/13/2015 1542   K 3.6 08/13/2015 1542   CL 99* 08/13/2015 1542   CO2 33* 04/17/2015 1611   BUN 8 08/13/2015 1542   CREATININE 0.80 08/13/2015 1542   CREATININE 0.64 02/04/2015 1619      Component Value Date/Time   CALCIUM 9.3 04/17/2015 1611   ALKPHOS 73 04/17/2015 1611   AST 18 04/17/2015 1611   ALT 13* 04/17/2015 1611   BILITOT 0.5 04/17/2015 1611       Lab Results  Component Value Date   WBC 6.6 08/13/2015   HGB 14.3 08/13/2015   HCT 42.0 08/13/2015   MCV 79.6 08/13/2015   PLT 326 08/13/2015   NEUTROABS 5.3 04/17/2015   ASSESSMENT & PLAN:  Breast cancer of upper-outer quadrant of right female breast 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 The final tumor size appeared to be much smaller than what we initially saw on the biopsy.  Recommendations: 1. Oncotype DX testing to determine if chemotherapy would be of any benefit followed by 2. Adjuvant radiation therapy followed by 3. Adjuvant  antiestrogen therapy  Return to clinic based upon the Oncotype DX score. Severe hot flashes: I prescribed her Effexor XR. Patient understands risks and benefits of this treatment.  No orders of the defined types were placed in this encounter.   The patient has a good understanding of the overall plan. she agrees with it. she will call with any problems that may develop before the next visit here.   Rulon Eisenmenger, MD 08/27/2015

## 2015-08-27 NOTE — Telephone Encounter (Signed)
Received order for oncotype testing per Dr. Lindi Adie. Requisition sent to pathology. Received by Alyse Low. PAC sent to [facility]

## 2015-08-30 ENCOUNTER — Ambulatory Visit (INDEPENDENT_AMBULATORY_CARE_PROVIDER_SITE_OTHER): Payer: BLUE CROSS/BLUE SHIELD | Admitting: Family Medicine

## 2015-08-30 ENCOUNTER — Encounter: Payer: Self-pay | Admitting: Family Medicine

## 2015-08-30 VITALS — BP 132/70 | HR 77 | Temp 98.9°F | Wt 173.0 lb

## 2015-08-30 DIAGNOSIS — N3000 Acute cystitis without hematuria: Secondary | ICD-10-CM | POA: Diagnosis not present

## 2015-08-30 DIAGNOSIS — R300 Dysuria: Secondary | ICD-10-CM

## 2015-08-30 LAB — POCT UA - MICROSCOPIC ONLY: WBC, Ur, HPF, POC: >20

## 2015-08-30 LAB — POCT URINALYSIS DIPSTICK
Bilirubin, UA: NEGATIVE
GLUCOSE UA: NEGATIVE
KETONES UA: NEGATIVE
NITRITE UA: NEGATIVE
PH UA: 7
Protein, UA: NEGATIVE
SPEC GRAV UA: 1.01
Urobilinogen, UA: 0.2

## 2015-08-30 MED ORDER — CEPHALEXIN 500 MG PO CAPS
500.0000 mg | ORAL_CAPSULE | Freq: Two times a day (BID) | ORAL | Status: DC
Start: 2015-08-30 — End: 2015-09-10

## 2015-08-30 NOTE — Progress Notes (Signed)
   HPI  CC: Dysuria Complaining of urinary frequency and dysuria for 1 week. Surgery on October 5th for Breast CA. Had sex just prior to the sx onset. No dyspareunia. No vaginal discharge. No foul odor. Urine "looks yellow".   ROS: Denies fevers, chills, flank pain, hematuria, nausea/vomiting/diarrhea, diaphoresis, lightheadedness, weakness, fatigue.  Objective: BP 132/70 mmHg  Pulse 77  Temp(Src) 98.9 F (37.2 C) (Oral)  Wt 173 lb (78.472 kg) Gen: NAD, alert, cooperative, and pleasant. CV: RRR, no murmur Resp: CTAB, no wheezes, non-labored Abd: SNTND, BS present, no guarding or organomegaly, no CVA tenderness Ext: No edema, warm Neuro: Alert and oriented, Speech clear, No gross deficits  Assessment and plan:  UTI (urinary tract infection) Patient signs and symptoms most consistent with acute cystitis secondary to intercourse. Urinalysis/microscopic assessment consistent with this. Differential includes complicated UTI secondary to catheterization. This is unlikely due to the fact that symptom onset was a little over a week after her surgery and began 24-48 hours after she had intercourse. - Keflex 500 mg twice a day 5 days. For adequate treatment of uncomplicated UTI. - Follow-up as needed or if symptoms persist/worsen over the next week to 2 weeks.    Orders Placed This Encounter  Procedures  . POCT urinalysis dipstick  . POCT UA - Microscopic Only    Meds ordered this encounter  Medications  . cephALEXin (KEFLEX) 500 MG capsule    Sig: Take 1 capsule (500 mg total) by mouth 2 (two) times daily.    Dispense:  10 capsule    Refill:  0     Elberta Leatherwood, MD,MS,  PGY2 08/31/2015 6:51 PM

## 2015-08-30 NOTE — Patient Instructions (Signed)

## 2015-08-31 DIAGNOSIS — N390 Urinary tract infection, site not specified: Secondary | ICD-10-CM | POA: Insufficient documentation

## 2015-08-31 NOTE — Assessment & Plan Note (Signed)
Patient signs and symptoms most consistent with acute cystitis secondary to intercourse. Urinalysis/microscopic assessment consistent with this. Differential includes complicated UTI secondary to catheterization. This is unlikely due to the fact that symptom onset was a little over a week after her surgery and began 24-48 hours after she had intercourse. - Keflex 500 mg twice a day 5 days. For adequate treatment of uncomplicated UTI. - Follow-up as needed or if symptoms persist/worsen over the next week to 2 weeks.

## 2015-09-01 ENCOUNTER — Ambulatory Visit: Payer: BLUE CROSS/BLUE SHIELD | Admitting: Hematology and Oncology

## 2015-09-03 ENCOUNTER — Telehealth: Payer: Self-pay | Admitting: *Deleted

## 2015-09-03 NOTE — Telephone Encounter (Signed)
Called pt to inform that there was insufficient tissue to perform the oncotype test and she would not need chemotherapy. Pt relate doing well and without complaints. Referral placed for appt with Dr. Isidore Moos for xrt.

## 2015-09-06 ENCOUNTER — Other Ambulatory Visit: Payer: Self-pay | Admitting: Internal Medicine

## 2015-09-07 NOTE — Progress Notes (Addendum)
Location of Breast Cancer: Right Breast  Histology per Pathology Report:   08/18/15 Diagnosis 1. Breast, lumpectomy, Right - INVASIVE DUCTAL CARCINOMA, GRADE 2/3, SPANNING 0.6 CM. - DUCTAL CARCINOMA IN SITU, HIGH GRADE. - THE SURGICAL RESECTION MARGINS ARE NEGATIVE FOR CARCINOMA.  2. Lymph node, sentinel, biopsy, right axillary #1 - THERE IS NO EVIDENCE OF CARCINOMA IN 1 OF 1 LYMPH NODE (0/1) 3. Lymph node, sentinel, biopsy, right axillary #2 - THERE IS NO EVIDENCE OF CARCINOMA IN 1 OF 1 LYMPH NODE (0/1)  Receptor Status: ER(100%), PR (POS), Her2-neu (NEG)  Did patient present with symptoms or was this found on screening mammography?  She presented with a screening detected calcifications in the right breast. She then got a diagnostic mammogram demonstrating an 11 mm area of suspicious calcs in the upper outer quadrant.   Past/Anticipated interventions by surgeon, if any: RADIOACTIVE SEED GUIDED PARTIAL MASTECTOMY WITH AXILLARY SENTINEL LYMPH NODE BIOPSY (Right) as a surgical intervention by Dr. Barry Dienes 08/18/2015.  Past/Anticipated interventions by medical oncology, if any: Dr. Lindi Adie reccommended Adjuvant antiestrogen therapy, and the patient was told 09/03/15 via phone there was insufficient tissue to perform the oncotype test and she would not need chemotherapy.  Lymphedema issues, if any: No  Pain issues, if any:  She denies pain at this time, but mentioned if she moves her right arm the wrong way or while she is sleeping she does experience pain at her surgical site in her axilla area.   SAFETY ISSUES:  Prior radiation? No  Pacemaker/ICD? No  Possible current pregnancy? No  Is the patient on methotrexate? No  Current Complaints / other details: She denies any other problems at this time. She has two incision sites at her axilla area and her right lateral breast, they are well healed without any redness.  BP 141/72 mmHg  Pulse 60  Temp(Src) 98.4 F (36.9 C)  Ht _0   (1.651 m)  Wt 175 lb 4.8 oz (79.516 kg)  BMI 29.17 kg/m2    Cloyce Blankenhorn, Stephani Police, RN 09/07/2015,9:29 AM

## 2015-09-10 ENCOUNTER — Ambulatory Visit
Admission: RE | Admit: 2015-09-10 | Discharge: 2015-09-10 | Disposition: A | Discharge status: Home / Self Care | Payer: BLUE CROSS/BLUE SHIELD | Source: Ambulatory Visit | Attending: Radiation Oncology | Admitting: Radiation Oncology

## 2015-09-10 ENCOUNTER — Encounter: Payer: Self-pay | Admitting: Radiation Oncology

## 2015-09-10 VITALS — BP 141/72 | HR 60 | Temp 98.4°F | Ht 65.0 in | Wt 175.3 lb

## 2015-09-10 DIAGNOSIS — C50411 Malignant neoplasm of upper-outer quadrant of right female breast: Secondary | ICD-10-CM

## 2015-09-10 DIAGNOSIS — Z510 Encounter for antineoplastic radiation therapy: Secondary | ICD-10-CM | POA: Diagnosis not present

## 2015-09-10 NOTE — Addendum Note (Signed)
Encounter addended by: Ernst Spell, RN on: 09/10/2015  1:01 PM<BR>     Documentation filed: Charges VN

## 2015-09-10 NOTE — Progress Notes (Addendum)
Radiation Oncology         (336) [phone] ________________________________  Name: [name] MRN: [id number]  Date: 09/10/2015  DOB: 01/14/1961  Follow-Up Visit Note  Outpatient  CC: Cordelia Poche, MD  Stark Klein, MD  Diagnosis:      ICD-9-CM ICD-10-CM   1. 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     Since consultation, she underwent right lumpectomy and axillary sentinel lymph node biopsy on 08/18/15 with Dr. Barry Dienes. Her tumor was 0.6 cm and her 2 sent nodes were negative. All margins are greater than 0.2 cm. Pathology notable for that described in the diagnosis.  She denies pain at this time, but report pain if she moves her right arm the wrong way or while she is sleeping. She does experience some pain at her surgical site in her right axilla.   She will not receive chemotherapy per med/onc.  ALLERGIES:  is allergic to codeine.  Meds: Current Outpatient Prescriptions  Medication Sig Dispense Refill  . albuterol (PROVENTIL HFA;VENTOLIN HFA) 108 (90 BASE) MCG/ACT inhaler Inhale 2 puffs into the lungs every 4 (four) hours as needed for wheezing or shortness of breath. For shortness of breath or wheezing 1 Inhaler 1  . budesonide-formoterol (SYMBICORT) 160-4.5 MCG/ACT inhaler Inhale 2 puffs into the lungs 2 (two) times daily. 1 Inhaler 4  . fluticasone (FLONASE) 50 MCG/ACT nasal spray Place 2 sprays into both nostrils daily as needed for allergies. 16 g 11  . hydrochlorothiazide (HYDRODIURIL) 12.5 MG tablet Take 1 tablet (12.5 mg total) by mouth daily. 90 tablet 3  . metoprolol tartrate (LOPRESSOR) 25 MG tablet TAKE ONE TABLET BY MOUTH TWICE DAILY 60 tablet 5  . Multiple Vitamin (MULTIVITAMIN) capsule Take 1 capsule by mouth  daily.    Marland Kitchen omeprazole (PRILOSEC) 20 MG capsule TAKE ONE CAPSULE BY MOUTH ONCE DAILY. TAKE 30 TO 60 MINUTES BEFORE YOUR FIRST AND LAST MEALS OF THE DAY. 60 capsule 0  . venlafaxine XR (EFFEXOR-XR) 37.5 MG 24 hr capsule Take 1 capsule (37.5 mg total) by mouth daily with breakfast. 30 capsule 11  . aspirin EC 81 MG EC tablet Take 1 tablet (81 mg total) by mouth daily. (Patient not taking: Reported on 09/10/2015) 30 tablet 11  . traMADol (ULTRAM) 50 MG tablet Take by mouth.     No current facility-administered medications for this encounter.    Physical Findings:  height is _0  (1.651 m) and weight is 175 lb 4.8 oz (79.516 kg). Her temperature is 98.4 F (36.9 C). Her blood pressure is 141/72 and her pulse is 60. Marland Kitchen     General: Alert and oriented, in no acute distress HEENT: Head is normocephalic. Neck: Neck is supple, no palpable cervical or supraclavicular lymphadenopathy. Heart: Regular in rate and rhythm with no murmurs, rubs, or gallops. Chest: Clear to auscultation bilaterally, with no rhonchi, wheezes, or rales. Extremities: No cyanosis or edema. Lymphatics: see Neck Exam Neurologic: No obvious focalities. Speech is fluent.  Psychiatric: Judgment and insight are intact. Affect is appropriate. Breast exam reveals right lumpectomy and axillary scars have healed well.  Lab Findings: Lab Results  Component Value Date   WBC 6.6 08/13/2015   HGB 14.3 08/13/2015   HCT 42.0 08/13/2015   MCV 79.6  08/13/2015   PLT 326 08/13/2015    CMP     Component Value Date/Time   NA 142 08/13/2015 1542   K 3.6 08/13/2015 1542   CL 99* 08/13/2015 1542   CO2 33* 04/17/2015 1611   GLUCOSE 78 08/13/2015 1542   BUN 8 08/13/2015 1542   CREATININE 0.80 08/13/2015 1542   CREATININE 0.64 02/04/2015 1619   CALCIUM 9.3 04/17/2015 1611   PROT 7.5 04/17/2015 1611   ALBUMIN 3.8 04/17/2015 1611   AST 18 04/17/2015 1611   ALT 13* 04/17/2015 1611   ALKPHOS 73 04/17/2015 1611   BILITOT 0.5 04/17/2015  1611   GFRNONAA >60 04/17/2015 1611   GFRAA >60 04/17/2015 1611      Radiographic Findings: Nm Sentinel Node Inj-no Rpt (breast)  08/18/2015  CLINICAL DATA: right breast cancer Sulfur colloid was injected intradermally by the nuclear medicine technologist for breast cancer sentinel node localization.   Mm Breast Surgical Specimen  08/18/2015  CLINICAL DATA:  Patient status post right breast lumpectomy. EXAM: SPECIMEN RADIOGRAPH OF THE RIGHT BREAST COMPARISON:  Previous exam(s). FINDINGS: Status post excision of the right breast. The radioactive seed and biopsy marker clip are present, completely intact, and were marked for pathology. IMPRESSION: Specimen radiograph of the right breast. Electronically Signed   By: Lovey Newcomer M.D.   On: 08/18/2015 14:56   Mm Rt Radioactive Seed Loc Mammo Guide  08/16/2015  CLINICAL DATA:  Recent diagnosis of right breast cancer following stereotactic biopsy of calcifications. Radioactive seed localization prior to lumpectomy was requested. EXAM: MAMMOGRAPHIC GUIDED RADIOACTIVE SEED LOCALIZATION OF THE RIGHT BREAST COMPARISON:  Previous exam(s). FINDINGS: Patient presents for radioactive seed localization prior to lumpectomy. I met with the patient and we discussed the procedure of seed localization including benefits and alternatives. We discussed the high likelihood of a successful procedure. We discussed the risks of the procedure including infection, bleeding, tissue injury and further surgery. We discussed the low dose of radioactivity involved in the procedure. Informed, written consent was given. The usual time-out protocol was performed immediately prior to the procedure. Using mammographic guidance, sterile technique, 2% lidocaine and an I-125 radioactive seed, an X shaped biopsy marker and several residual microcalcifications were localized using a lateral to medial approach. The follow-up mammogram images confirm the seed in the expected location and were  marked for Dr. Barry Dienes. Follow-up survey of the patient confirms presence of the radioactive seed. Order number of I-125 seed:  [id number]. Total activity:  0.255 mCi  Reference Date: 22 July 2015 The patient tolerated the procedure well and was released from the [facility]. She was given instructions regarding seed removal. IMPRESSION: Radioactive seed localization right breast. No apparent complications. Electronically Signed   By: Curlene Dolphin M.D.   On: 08/16/2015 14:27    Impression/Plan: We discussed adjuvant radiotherapy today.  I recommend radiotherapy to her right breast in order to decrease her chance of locoregional recurrence by two-thirds.  The risks, benefits and side effects of this treatment were discussed in detail.  She understands that radiotherapy is associated with skin irritation and fatigue in the acute setting. Late effects can include cosmetic changes and rare injury to internal organs.   She is enthusiastic about proceeding with treatment. A consent form has been signed and placed in her chart. Simulation for this Mon 10-31.  Of note - at tumor board discussion, she does not need a pre-RT mammogram per radiology's opinion.  I spent 25 minutes minutes face to face with the  patient and more than 50% of that time was spent in counseling and/or coordination of care.  This document serves as a record of services personally performed by Eppie Gibson, MD. It was created on her behalf by Darcus Austin, a trained medical scribe. The creation of this record is based on the scribe's personal observations and the provider's statements to them. This document has been checked and approved by the attending provider.     _____________________________________   Eppie Gibson, MD

## 2015-09-13 ENCOUNTER — Encounter: Payer: Self-pay | Admitting: *Deleted

## 2015-09-13 ENCOUNTER — Ambulatory Visit
Admission: RE | Admit: 2015-09-13 | Discharge: 2015-09-13 | Disposition: A | Discharge status: Home / Self Care | Payer: BLUE CROSS/BLUE SHIELD | Source: Ambulatory Visit | Attending: Radiation Oncology | Admitting: Radiation Oncology

## 2015-09-13 DIAGNOSIS — C50411 Malignant neoplasm of upper-outer quadrant of right female breast: Secondary | ICD-10-CM

## 2015-09-13 DIAGNOSIS — Z510 Encounter for antineoplastic radiation therapy: Secondary | ICD-10-CM | POA: Diagnosis not present

## 2015-09-13 NOTE — Addendum Note (Signed)
Encounter addended by: Eppie Gibson, MD on: 09/13/2015  7:49 AM<BR>     Documentation filed: Notes Section

## 2015-09-13 NOTE — Progress Notes (Addendum)
  Radiation Oncology         [phone]) [phone] ________________________________  Name: [name] MRN: [id number]  Date: 09/13/2015  DOB: 04/12/1961  SIMULATION AND TREATMENT PLANNING NOTE    Outpatient  DIAGNOSIS:     ICD-9-CM ICD-10-CM   1. Breast cancer of upper-outer quadrant of right female breast ([place]) 174.4 C50.411     NARRATIVE:  The patient was brought to the [facility].  Identity was confirmed.  All relevant records and images related to the planned course of therapy were reviewed.  The patient freely provided informed written consent to proceed with treatment after reviewing the details related to the planned course of therapy. The consent form was witnessed and verified by the simulation staff.    Then, the patient was set-up in a stable reproducible supine position for radiation therapy with her ipsilateral arm over her head, and her upper body secured in a custom-made Vac-lok device.  CT images were obtained.  Surface markings were placed.  The CT images were loaded into the planning software.    TREATMENT PLANNING NOTE: Treatment planning then occurred.  The radiation prescription was entered and confirmed.     A total of 3 medically necessary complex treatment devices were fabricated and supervised by me: 2 fields with MLCs for custom blocks to protect heart, and lungs;  and, a Vac-lok. MORE COMPLEX DEVICES MAY BE MADE IN DOSIMETRY FOR FIELD IN FIELD BEAMS FOR DOSE HOMOGENEITY.  I have requested : 3D Simulation  I have requested a DVH of the following structures: lungs, heart, lumpectomy cavity.    The patient will receive 40.05 Gy in 15 fractions to the right breast with 2 tangential fields.    This will   be followed by a boost.  Optical Surface Tracking Plan:  Since intensity modulated radiotherapy (IMRT) and 3D conformal radiation treatment methods are predicated on accurate and precise positioning for treatment, intrafraction motion monitoring is  medically necessary to ensure accurate and safe treatment delivery. The ability to quantify intrafraction motion without excessive ionizing radiation dose can only be performed with optical surface tracking. Accordingly, surface imaging offers the opportunity to obtain 3D measurements of patient position throughout IMRT and 3D treatments without excessive radiation exposure. I am ordering optical surface tracking for this patient's upcoming course of radiotherapy.  ________________________________   Reference:  Ursula Alert, J, et al. Surface imaging-based analysis of intrafraction motion for breast radiotherapy patients.Journal of [place], n. 6, nov. 2014. ISSN [id number].  Available at: <http://www.jacmp.org/index.php/jacmp/article/view/4957>.    -----------------------------------  Eppie Gibson, MD

## 2015-09-14 ENCOUNTER — Telehealth: Payer: Self-pay | Admitting: Hematology and Oncology

## 2015-09-14 ENCOUNTER — Ambulatory Visit: Payer: BLUE CROSS/BLUE SHIELD

## 2015-09-14 NOTE — Telephone Encounter (Signed)
Spoke with patient re appointment w/[facility] 11/22/15.

## 2015-09-15 DIAGNOSIS — Z510 Encounter for antineoplastic radiation therapy: Secondary | ICD-10-CM | POA: Diagnosis not present

## 2015-09-20 ENCOUNTER — Ambulatory Visit (INDEPENDENT_AMBULATORY_CARE_PROVIDER_SITE_OTHER): Payer: BLUE CROSS/BLUE SHIELD

## 2015-09-20 ENCOUNTER — Ambulatory Visit
Admission: RE | Admit: 2015-09-20 | Discharge: 2015-09-20 | Disposition: A | Discharge status: Home / Self Care | Payer: BLUE CROSS/BLUE SHIELD | Source: Ambulatory Visit | Attending: Radiation Oncology | Admitting: Radiation Oncology

## 2015-09-20 DIAGNOSIS — Z23 Encounter for immunization: Secondary | ICD-10-CM | POA: Diagnosis not present

## 2015-09-20 DIAGNOSIS — Z510 Encounter for antineoplastic radiation therapy: Secondary | ICD-10-CM | POA: Diagnosis not present

## 2015-09-21 ENCOUNTER — Ambulatory Visit: Payer: BLUE CROSS/BLUE SHIELD

## 2015-09-21 ENCOUNTER — Ambulatory Visit
Admission: RE | Admit: 2015-09-21 | Discharge: 2015-09-21 | Disposition: A | Discharge status: Home / Self Care | Payer: BLUE CROSS/BLUE SHIELD | Source: Ambulatory Visit | Attending: Radiation Oncology | Admitting: Radiation Oncology

## 2015-09-21 DIAGNOSIS — Z510 Encounter for antineoplastic radiation therapy: Secondary | ICD-10-CM | POA: Diagnosis not present

## 2015-09-22 ENCOUNTER — Ambulatory Visit
Admission: RE | Admit: 2015-09-22 | Discharge: 2015-09-22 | Disposition: A | Discharge status: Home / Self Care | Payer: BLUE CROSS/BLUE SHIELD | Source: Ambulatory Visit | Attending: Radiation Oncology | Admitting: Radiation Oncology

## 2015-09-22 DIAGNOSIS — Z510 Encounter for antineoplastic radiation therapy: Secondary | ICD-10-CM | POA: Diagnosis not present

## 2015-09-23 ENCOUNTER — Ambulatory Visit
Admission: RE | Admit: 2015-09-23 | Discharge: 2015-09-23 | Disposition: A | Discharge status: Home / Self Care | Payer: BLUE CROSS/BLUE SHIELD | Source: Ambulatory Visit | Attending: Radiation Oncology | Admitting: Radiation Oncology

## 2015-09-23 DIAGNOSIS — Z510 Encounter for antineoplastic radiation therapy: Secondary | ICD-10-CM | POA: Diagnosis not present

## 2015-09-24 ENCOUNTER — Ambulatory Visit
Admission: RE | Admit: 2015-09-24 | Discharge: 2015-09-24 | Disposition: A | Discharge status: Home / Self Care | Payer: BLUE CROSS/BLUE SHIELD | Source: Ambulatory Visit | Attending: Radiation Oncology | Admitting: Radiation Oncology

## 2015-09-24 DIAGNOSIS — Z510 Encounter for antineoplastic radiation therapy: Secondary | ICD-10-CM | POA: Diagnosis not present

## 2015-09-27 ENCOUNTER — Ambulatory Visit
Admission: RE | Admit: 2015-09-27 | Discharge: 2015-09-27 | Disposition: A | Discharge status: Home / Self Care | Payer: BLUE CROSS/BLUE SHIELD | Source: Ambulatory Visit | Attending: Radiation Oncology | Admitting: Radiation Oncology

## 2015-09-27 ENCOUNTER — Encounter: Payer: Self-pay | Admitting: Radiation Oncology

## 2015-09-27 ENCOUNTER — Ambulatory Visit
Admission: RE | Admit: 2015-09-27 | Payer: BLUE CROSS/BLUE SHIELD | Source: Ambulatory Visit | Admitting: Radiation Oncology

## 2015-09-27 VITALS — BP 136/76 | HR 73 | Temp 98.4°F | Resp 12 | Ht 65.0 in | Wt 177.7 lb

## 2015-09-27 DIAGNOSIS — C50411 Malignant neoplasm of upper-outer quadrant of right female breast: Secondary | ICD-10-CM

## 2015-09-27 DIAGNOSIS — Z510 Encounter for antineoplastic radiation therapy: Secondary | ICD-10-CM | POA: Diagnosis not present

## 2015-09-27 MED ORDER — ALRA NON-METALLIC DEODORANT (RAD-ONC)
1.0000 "application " | Freq: Once | TOPICAL | Status: AC
  Administered 2015-09-27: 1 via TOPICAL

## 2015-09-27 MED ORDER — RADIAPLEXRX EX GEL
Freq: Once | CUTANEOUS | Status: AC
  Administered 2015-09-27: 13:00:00 via TOPICAL

## 2015-09-27 NOTE — Progress Notes (Signed)
   Weekly Management Note:  Outpatient    ICD-9-CM ICD-10-CM   1. Breast cancer of upper-outer quadrant of right female breast ([place]) 174.4 C50.411     Current Dose:  13.35 Gy  Projected Dose: 50.05 Gy   Narrative:  The patient presents for routine under treatment assessment.  CBCT/MVCT images/Port film x-rays were reviewed.  The chart was checked. No new side effecs but has some social stressors including an ill daughter   Physical Findings:  height is 5\' 5"  (1.651 m) and weight is 177 lb 11.2 oz (80.604 kg). Her oral temperature is 98.4 F (36.9 C). Her blood pressure is 136/76 and her pulse is 73. Her respiration is 12.   Wt Readings from Last 3 Encounters:  09/27/15 177 lb 11.2 oz (80.604 kg)  09/10/15 175 lb 4.8 oz (79.516 kg)  08/30/15 173 lb (78.472 kg)   No skin changes over right breast  Impression:  The patient is tolerating radiotherapy.  Plan:  Continue radiotherapy as planned.  Refer to social work  ________________________________   Eppie Gibson, M.D.

## 2015-09-27 NOTE — Progress Notes (Signed)
[name] has completed 5 fractions to her right breast.  She denies pain and fatigue.  The skin on her right breast is intact.  BP 136/76 mmHg  Pulse 73  Temp(Src) 98.4 F (36.9 C) (Oral)  Resp 12  Ht 5\' 5"  (1.651 m)  Wt 177 lb 11.2 oz (80.604 kg)  BMI 29.57 kg/m2

## 2015-09-27 NOTE — Progress Notes (Signed)
Pt here for patient teaching.  Pt given Radiation and You booklet, skin care instructions, Alra deodorant and Radiaplex gel. Reviewed areas of pertinence such as fatigue and skin changes . Pt able to give teach back of to pat skin and use unscented/gentle soap,apply Radiaplex bid and avoid applying anything to skin within 4 hours of treatment. Pt demonstrated understanding and verbalizes understanding of information given and will contact nursing with any questions or concerns.        

## 2015-09-28 ENCOUNTER — Ambulatory Visit
Admission: RE | Admit: 2015-09-28 | Discharge: 2015-09-28 | Disposition: A | Discharge status: Home / Self Care | Payer: BLUE CROSS/BLUE SHIELD | Source: Ambulatory Visit | Attending: Radiation Oncology | Admitting: Radiation Oncology

## 2015-09-28 ENCOUNTER — Telehealth: Payer: Self-pay | Admitting: *Deleted

## 2015-09-28 DIAGNOSIS — Z510 Encounter for antineoplastic radiation therapy: Secondary | ICD-10-CM | POA: Diagnosis not present

## 2015-09-28 NOTE — Telephone Encounter (Signed)
Called pt to assess needs during xrt. Relate doing well and without complaints. Pt relate she is in need of financial assistance and has a cancer policy that she needs to discuss. Referral placed for SW and FC to reach out to pt for assistance. Discussed survivorship program and referral at the completion of xrt.  Encourage pt to call with needs or questions. Received verbal understanding.

## 2015-09-29 ENCOUNTER — Ambulatory Visit
Admission: RE | Admit: 2015-09-29 | Discharge: 2015-09-29 | Disposition: A | Discharge status: Home / Self Care | Payer: BLUE CROSS/BLUE SHIELD | Source: Ambulatory Visit | Attending: Radiation Oncology | Admitting: Radiation Oncology

## 2015-09-29 DIAGNOSIS — Z510 Encounter for antineoplastic radiation therapy: Secondary | ICD-10-CM | POA: Diagnosis not present

## 2015-09-30 ENCOUNTER — Ambulatory Visit
Admission: RE | Admit: 2015-09-30 | Discharge: 2015-09-30 | Disposition: A | Discharge status: Home / Self Care | Payer: BLUE CROSS/BLUE SHIELD | Source: Ambulatory Visit | Attending: Radiation Oncology | Admitting: Radiation Oncology

## 2015-09-30 ENCOUNTER — Encounter: Payer: Self-pay | Admitting: *Deleted

## 2015-09-30 DIAGNOSIS — Z510 Encounter for antineoplastic radiation therapy: Secondary | ICD-10-CM | POA: Diagnosis not present

## 2015-09-30 NOTE — Progress Notes (Signed)
[facility] Work  Clinical Social Work was referred by patient navigator for assessment of psychosocial needs.  Clinical Social Worker contacted patient at home to offer support and assess for needs. Pt reports to have financial difficulty due to not working while in treatment. CSW reviewed options for support and made referral to financial counselor. CSW will also meet with pt on 10/01/15 to review additional options for assistance. Pt appreciative of call and information. CSW to follow.    Clinical Social Work interventions: Resource assistance  Loren Racer, [place] Worker [place]  [place] Phone: [phone] Fax: [phone]

## 2015-10-01 ENCOUNTER — Encounter: Payer: Self-pay | Admitting: Radiation Oncology

## 2015-10-01 ENCOUNTER — Ambulatory Visit
Admission: RE | Admit: 2015-10-01 | Discharge: 2015-10-01 | Disposition: A | Discharge status: Home / Self Care | Payer: BLUE CROSS/BLUE SHIELD | Source: Ambulatory Visit | Attending: Radiation Oncology | Admitting: Radiation Oncology

## 2015-10-01 DIAGNOSIS — Z510 Encounter for antineoplastic radiation therapy: Secondary | ICD-10-CM | POA: Diagnosis not present

## 2015-10-04 ENCOUNTER — Ambulatory Visit
Admission: RE | Admit: 2015-10-04 | Discharge: 2015-10-04 | Disposition: A | Discharge status: Home / Self Care | Payer: BLUE CROSS/BLUE SHIELD | Source: Ambulatory Visit | Attending: Radiation Oncology | Admitting: Radiation Oncology

## 2015-10-04 ENCOUNTER — Encounter: Payer: Self-pay | Admitting: Radiation Oncology

## 2015-10-04 ENCOUNTER — Telehealth: Payer: Self-pay

## 2015-10-04 VITALS — BP 150/74 | HR 63 | Temp 98.2°F | Ht 65.0 in | Wt 177.0 lb

## 2015-10-04 DIAGNOSIS — Z510 Encounter for antineoplastic radiation therapy: Secondary | ICD-10-CM | POA: Diagnosis not present

## 2015-10-04 DIAGNOSIS — C50411 Malignant neoplasm of upper-outer quadrant of right female breast: Secondary | ICD-10-CM

## 2015-10-04 NOTE — Progress Notes (Signed)
[name] asked to speak with a financial counselor regarding some help with her bills. I have contacted Ailene Ravel who states she will give her a call to see what she needs.

## 2015-10-04 NOTE — Progress Notes (Signed)
   Weekly Management Note:  Outpatient    ICD-9-CM ICD-10-CM   1. Breast cancer of upper-outer quadrant of right female breast ([place]) 174.4 C50.411     Current Dose:  26.7 Gy  Projected Dose: 50.05 Gy   Narrative:  The patient presents for routine under treatment assessment.  CBCT/MVCT images/Port film x-rays were reviewed.  The chart was checked. She has some fatigue, decreased appetite. Weight stable  Physical Findings:  height is 5\' 5"  (1.651 m) and weight is 177 lb (80.287 kg). Her temperature is 98.2 F (36.8 C). Her blood pressure is 150/74 and her pulse is 63.   Wt Readings from Last 3 Encounters:  10/04/15 177 lb (80.287 kg)  09/27/15 177 lb 11.2 oz (80.604 kg)  09/10/15 175 lb 4.8 oz (79.516 kg)   Hyperpigmentation at inframammary fold on right. Skin intact.  Impression:  The patient is tolerating radiotherapy.  Plan:  Continue radiotherapy as planned.  Neosporin prn skin peeling in future ________________________________   Eppie Gibson, M.D.

## 2015-10-04 NOTE — Progress Notes (Signed)
[name] is here for her 10th fraction of radiation to her Right Breast. She admits to some fatigue, and is going to bed early every night. She also reports a decreased appetite and is only eating one meal a day sometimes. She reports she does try to drink several bottles of water a day. Her Right breast is slightly hyperpigmented and tender with certain movements. She is using the radiaplex cream as instructed.   BP 150/74 mmHg  Pulse 63  Temp(Src) 98.2 F (36.8 C)  Ht 5\' 5"  (1.651 m)  Wt 177 lb (80.287 kg)  BMI 29.45 kg/m2

## 2015-10-05 ENCOUNTER — Ambulatory Visit
Admission: RE | Admit: 2015-10-05 | Discharge: 2015-10-05 | Disposition: A | Discharge status: Home / Self Care | Payer: BLUE CROSS/BLUE SHIELD | Source: Ambulatory Visit | Attending: Radiation Oncology | Admitting: Radiation Oncology

## 2015-10-05 DIAGNOSIS — Z510 Encounter for antineoplastic radiation therapy: Secondary | ICD-10-CM | POA: Diagnosis not present

## 2015-10-06 ENCOUNTER — Ambulatory Visit
Admission: RE | Admit: 2015-10-06 | Discharge: 2015-10-06 | Disposition: A | Discharge status: Home / Self Care | Payer: BLUE CROSS/BLUE SHIELD | Source: Ambulatory Visit | Attending: Radiation Oncology | Admitting: Radiation Oncology

## 2015-10-06 DIAGNOSIS — Z510 Encounter for antineoplastic radiation therapy: Secondary | ICD-10-CM | POA: Diagnosis not present

## 2015-10-08 ENCOUNTER — Inpatient Hospital Stay (HOSPITAL_COMMUNITY)
Admission: EM | Admit: 2015-10-08 | Discharge: 2015-10-11 | DRG: 600 | Disposition: A | Discharge status: Home / Self Care | Payer: BLUE CROSS/BLUE SHIELD | Attending: Family Medicine | Admitting: Family Medicine

## 2015-10-08 ENCOUNTER — Emergency Department (HOSPITAL_COMMUNITY): Payer: BLUE CROSS/BLUE SHIELD

## 2015-10-08 DIAGNOSIS — L0390 Cellulitis, unspecified: Secondary | ICD-10-CM | POA: Diagnosis present

## 2015-10-08 DIAGNOSIS — Z9011 Acquired absence of right breast and nipple: Secondary | ICD-10-CM | POA: Diagnosis not present

## 2015-10-08 DIAGNOSIS — Z803 Family history of malignant neoplasm of breast: Secondary | ICD-10-CM

## 2015-10-08 DIAGNOSIS — E876 Hypokalemia: Secondary | ICD-10-CM | POA: Diagnosis present

## 2015-10-08 DIAGNOSIS — R6520 Severe sepsis without septic shock: Secondary | ICD-10-CM | POA: Diagnosis present

## 2015-10-08 DIAGNOSIS — A419 Sepsis, unspecified organism: Secondary | ICD-10-CM | POA: Diagnosis present

## 2015-10-08 DIAGNOSIS — Z8249 Family history of ischemic heart disease and other diseases of the circulatory system: Secondary | ICD-10-CM | POA: Diagnosis not present

## 2015-10-08 DIAGNOSIS — D869 Sarcoidosis, unspecified: Secondary | ICD-10-CM | POA: Diagnosis present

## 2015-10-08 DIAGNOSIS — R000 Tachycardia, unspecified: Secondary | ICD-10-CM | POA: Diagnosis present

## 2015-10-08 DIAGNOSIS — E785 Hyperlipidemia, unspecified: Secondary | ICD-10-CM | POA: Diagnosis present

## 2015-10-08 DIAGNOSIS — J449 Chronic obstructive pulmonary disease, unspecified: Secondary | ICD-10-CM | POA: Diagnosis present

## 2015-10-08 DIAGNOSIS — Z825 Family history of asthma and other chronic lower respiratory diseases: Secondary | ICD-10-CM

## 2015-10-08 DIAGNOSIS — Z923 Personal history of irradiation: Secondary | ICD-10-CM | POA: Diagnosis not present

## 2015-10-08 DIAGNOSIS — D573 Sickle-cell trait: Secondary | ICD-10-CM | POA: Diagnosis present

## 2015-10-08 DIAGNOSIS — C50411 Malignant neoplasm of upper-outer quadrant of right female breast: Secondary | ICD-10-CM | POA: Diagnosis present

## 2015-10-08 DIAGNOSIS — N61 Mastitis without abscess: Secondary | ICD-10-CM | POA: Diagnosis present

## 2015-10-08 DIAGNOSIS — K219 Gastro-esophageal reflux disease without esophagitis: Secondary | ICD-10-CM | POA: Diagnosis present

## 2015-10-08 DIAGNOSIS — I10 Essential (primary) hypertension: Secondary | ICD-10-CM | POA: Diagnosis present

## 2015-10-08 DIAGNOSIS — Z7951 Long term (current) use of inhaled steroids: Secondary | ICD-10-CM | POA: Diagnosis not present

## 2015-10-08 DIAGNOSIS — Z885 Allergy status to narcotic agent status: Secondary | ICD-10-CM

## 2015-10-08 DIAGNOSIS — Z87891 Personal history of nicotine dependence: Secondary | ICD-10-CM | POA: Diagnosis not present

## 2015-10-08 DIAGNOSIS — Z7982 Long term (current) use of aspirin: Secondary | ICD-10-CM | POA: Diagnosis not present

## 2015-10-08 DIAGNOSIS — Z7952 Long term (current) use of systemic steroids: Secondary | ICD-10-CM

## 2015-10-08 DIAGNOSIS — Z510 Encounter for antineoplastic radiation therapy: Secondary | ICD-10-CM | POA: Diagnosis present

## 2015-10-08 DIAGNOSIS — L304 Erythema intertrigo: Secondary | ICD-10-CM | POA: Diagnosis present

## 2015-10-08 DIAGNOSIS — Z170 Estrogen receptor positive status [ER+]: Secondary | ICD-10-CM | POA: Diagnosis not present

## 2015-10-08 DIAGNOSIS — D509 Iron deficiency anemia, unspecified: Secondary | ICD-10-CM | POA: Diagnosis not present

## 2015-10-08 LAB — CBC
HEMATOCRIT: 33.8 % — AB (ref 36.0–46.0)
Hemoglobin: 11.2 g/dL — ABNORMAL LOW (ref 12.0–15.0)
MCH: 26.5 pg (ref 26.0–34.0)
MCHC: 33.1 g/dL (ref 30.0–36.0)
MCV: 79.9 fL (ref 78.0–100.0)
Platelets: 257 10*3/uL (ref 150–400)
RBC: 4.23 MIL/uL (ref 3.87–5.11)
RDW: 15.8 % — AB (ref 11.5–15.5)
WBC: 15.1 10*3/uL — ABNORMAL HIGH (ref 4.0–10.5)

## 2015-10-08 LAB — CBC WITH DIFFERENTIAL/PLATELET
BASOS ABS: 0 10*3/uL (ref 0.0–0.1)
BASOS PCT: 0 %
EOS ABS: 0 10*3/uL (ref 0.0–0.7)
Eosinophils Relative: 0 %
HCT: 36.1 % (ref 36.0–46.0)
HEMOGLOBIN: 12.1 g/dL (ref 12.0–15.0)
Lymphocytes Relative: 3 %
Lymphs Abs: 0.4 10*3/uL — ABNORMAL LOW (ref 0.7–4.0)
MCH: 26.8 pg (ref 26.0–34.0)
MCHC: 33.5 g/dL (ref 30.0–36.0)
MCV: 79.9 fL (ref 78.0–100.0)
MONO ABS: 1.2 10*3/uL — AB (ref 0.1–1.0)
MONOS PCT: 7 %
NEUTROS ABS: 15.4 10*3/uL — AB (ref 1.7–7.7)
NEUTROS PCT: 90 %
Platelets: 299 10*3/uL (ref 150–400)
RBC: 4.52 MIL/uL (ref 3.87–5.11)
RDW: 15.6 % — AB (ref 11.5–15.5)
WBC: 17 10*3/uL — ABNORMAL HIGH (ref 4.0–10.5)

## 2015-10-08 LAB — COMPREHENSIVE METABOLIC PANEL
ALK PHOS: 72 U/L (ref 38–126)
ALT: 12 U/L — AB (ref 14–54)
ANION GAP: 6 (ref 5–15)
AST: 18 U/L (ref 15–41)
Albumin: 3.8 g/dL (ref 3.5–5.0)
BILIRUBIN TOTAL: 0.5 mg/dL (ref 0.3–1.2)
BUN: 9 mg/dL (ref 6–20)
CALCIUM: 9.1 mg/dL (ref 8.9–10.3)
CO2: 30 mmol/L (ref 22–32)
CREATININE: 0.62 mg/dL (ref 0.44–1.00)
Chloride: 101 mmol/L (ref 101–111)
Glucose, Bld: 110 mg/dL — ABNORMAL HIGH (ref 65–99)
Potassium: 3.7 mmol/L (ref 3.5–5.1)
SODIUM: 137 mmol/L (ref 135–145)
TOTAL PROTEIN: 7.8 g/dL (ref 6.5–8.1)

## 2015-10-08 LAB — CREATININE, SERUM: Creatinine, Ser: 0.72 mg/dL (ref 0.44–1.00)

## 2015-10-08 LAB — URINALYSIS, ROUTINE W REFLEX MICROSCOPIC
BILIRUBIN URINE: NEGATIVE
Glucose, UA: NEGATIVE mg/dL
HGB URINE DIPSTICK: NEGATIVE
KETONES UR: NEGATIVE mg/dL
Leukocytes, UA: NEGATIVE
Nitrite: NEGATIVE
PH: 8.5 — AB (ref 5.0–8.0)
Protein, ur: NEGATIVE mg/dL
SPECIFIC GRAVITY, URINE: 1.015 (ref 1.005–1.030)

## 2015-10-08 LAB — I-STAT CG4 LACTIC ACID, ED
Lactic Acid, Venous: 1.32 mmol/L (ref 0.5–2.0)
Lactic Acid, Venous: 0.38 mmol/L — ABNORMAL LOW (ref 0.5–2.0)

## 2015-10-08 MED ORDER — ACETAMINOPHEN 500 MG PO TABS
1000.0000 mg | ORAL_TABLET | Freq: Four times a day (QID) | ORAL | Status: DC | PRN
  Administered 2015-10-08: 1000 mg via ORAL
  Filled 2015-10-08: qty 2

## 2015-10-08 MED ORDER — PIPERACILLIN-TAZOBACTAM 3.375 G IVPB
3.3750 g | Freq: Three times a day (TID) | INTRAVENOUS | Status: DC
  Administered 2015-10-08 – 2015-10-10 (×6): 3.375 g via INTRAVENOUS
  Filled 2015-10-08 (×7): qty 50

## 2015-10-08 MED ORDER — TRAMADOL HCL 50 MG PO TABS
50.0000 mg | ORAL_TABLET | Freq: Every day | ORAL | Status: DC | PRN
  Administered 2015-10-08 – 2015-10-09 (×2): 50 mg via ORAL
  Filled 2015-10-08 (×2): qty 1

## 2015-10-08 MED ORDER — HYDROCHLOROTHIAZIDE 25 MG PO TABS
12.5000 mg | ORAL_TABLET | Freq: Every day | ORAL | Status: DC
  Administered 2015-10-08 – 2015-10-11 (×4): 12.5 mg via ORAL
  Filled 2015-10-08 (×5): qty 1

## 2015-10-08 MED ORDER — VENLAFAXINE HCL ER 37.5 MG PO CP24
37.5000 mg | ORAL_CAPSULE | Freq: Every day | ORAL | Status: DC
  Administered 2015-10-09 – 2015-10-11 (×3): 37.5 mg via ORAL
  Filled 2015-10-08 (×4): qty 1

## 2015-10-08 MED ORDER — ENOXAPARIN SODIUM 40 MG/0.4ML ~~LOC~~ SOLN
40.0000 mg | SUBCUTANEOUS | Status: DC
  Administered 2015-10-08 – 2015-10-10 (×3): 40 mg via SUBCUTANEOUS
  Filled 2015-10-08 (×2): qty 0.4

## 2015-10-08 MED ORDER — VANCOMYCIN HCL IN DEXTROSE 1-5 GM/200ML-% IV SOLN
1000.0000 mg | Freq: Two times a day (BID) | INTRAVENOUS | Status: DC
  Administered 2015-10-09 (×3): 1000 mg via INTRAVENOUS
  Filled 2015-10-08 (×4): qty 200

## 2015-10-08 MED ORDER — BUDESONIDE-FORMOTEROL FUMARATE 160-4.5 MCG/ACT IN AERO
2.0000 | INHALATION_SPRAY | Freq: Two times a day (BID) | RESPIRATORY_TRACT | Status: DC
  Administered 2015-10-08 – 2015-10-11 (×6): 2 via RESPIRATORY_TRACT
  Filled 2015-10-08: qty 6

## 2015-10-08 MED ORDER — VANCOMYCIN HCL IN DEXTROSE 1-5 GM/200ML-% IV SOLN
1000.0000 mg | Freq: Two times a day (BID) | INTRAVENOUS | Status: DC
  Filled 2015-10-08 (×2): qty 200

## 2015-10-08 MED ORDER — ASPIRIN EC 81 MG PO TBEC
81.0000 mg | DELAYED_RELEASE_TABLET | Freq: Every day | ORAL | Status: DC
  Administered 2015-10-08 – 2015-10-11 (×4): 81 mg via ORAL
  Filled 2015-10-08 (×4): qty 1

## 2015-10-08 MED ORDER — RADIAPLEXRX EX GEL
1.0000 "application " | Freq: Once | CUTANEOUS | Status: DC
  Filled 2015-10-08: qty 170

## 2015-10-08 MED ORDER — SODIUM CHLORIDE 0.9 % IV BOLUS (SEPSIS)
500.0000 mL | INTRAVENOUS | Status: AC
  Administered 2015-10-08: 500 mL via INTRAVENOUS

## 2015-10-08 MED ORDER — METOPROLOL TARTRATE 25 MG PO TABS
25.0000 mg | ORAL_TABLET | Freq: Two times a day (BID) | ORAL | Status: DC
  Administered 2015-10-08 – 2015-10-11 (×6): 25 mg via ORAL
  Filled 2015-10-08 (×6): qty 1

## 2015-10-08 MED ORDER — SODIUM CHLORIDE 0.9 % IV SOLN
INTRAVENOUS | Status: DC
  Administered 2015-10-08 – 2015-10-09 (×2): via INTRAVENOUS

## 2015-10-08 MED ORDER — PIPERACILLIN-TAZOBACTAM 3.375 G IVPB
3.3750 g | Freq: Three times a day (TID) | INTRAVENOUS | Status: DC
  Filled 2015-10-08: qty 50

## 2015-10-08 MED ORDER — ALBUTEROL SULFATE (2.5 MG/3ML) 0.083% IN NEBU: 3.0000 mL | INHALATION_SOLUTION | RESPIRATORY_TRACT | Status: DC | PRN

## 2015-10-08 MED ORDER — SODIUM CHLORIDE 0.9 % IV BOLUS (SEPSIS)
1000.0000 mL | INTRAVENOUS | Status: AC
  Administered 2015-10-08 (×2): 1000 mL via INTRAVENOUS

## 2015-10-08 MED ORDER — FLUTICASONE PROPIONATE 50 MCG/ACT NA SUSP
2.0000 | Freq: Every day | NASAL | Status: DC | PRN
  Filled 2015-10-08: qty 16

## 2015-10-08 MED ORDER — PIPERACILLIN-TAZOBACTAM 3.375 G IVPB 30 MIN
3.3750 g | Freq: Once | INTRAVENOUS | Status: AC
  Administered 2015-10-08: 3.375 g via INTRAVENOUS
  Filled 2015-10-08: qty 50

## 2015-10-08 MED ORDER — IBUPROFEN 200 MG PO TABS
600.0000 mg | ORAL_TABLET | Freq: Once | ORAL | Status: AC
  Administered 2015-10-08: 600 mg via ORAL
  Filled 2015-10-08: qty 3

## 2015-10-08 MED ORDER — PANTOPRAZOLE SODIUM 40 MG PO TBEC
40.0000 mg | DELAYED_RELEASE_TABLET | Freq: Every day | ORAL | Status: DC
  Administered 2015-10-08 – 2015-10-11 (×4): 40 mg via ORAL
  Filled 2015-10-08 (×4): qty 1

## 2015-10-08 MED ORDER — VANCOMYCIN HCL IN DEXTROSE 1-5 GM/200ML-% IV SOLN
1000.0000 mg | Freq: Once | INTRAVENOUS | Status: AC
  Administered 2015-10-08: 1000 mg via INTRAVENOUS
  Filled 2015-10-08: qty 200

## 2015-10-08 NOTE — ED Provider Notes (Signed)
CSN: [id number]     Arrival date & time 10/08/15  1037 History   First MD Initiated Contact with Patient 10/08/15 1057     Chief Complaint  Patient presents with  . Fever; Cellulitis      (Consider location/radiation/quality/duration/timing/severity/associated sxs/prior Treatment) HPI Comments: Patient presents with fever. She has a history of right-sided breast cancer status post lumpectomy in October and she is currently on radiation therapy. She is not on chemotherapy. She states that she started feeling hot and having chills last night. She was noted to have a fever today of 102. She denies any runny nose cough or chest congestion. She does have a little bit of soreness to her right breast. She denies any nausea vomiting or diarrhea. There is no abdominal pain. No sore throat. No urinary symptoms. No known wounds or rashes.  Patient is a 54 y.o. female presenting with fever.  Fever Associated symptoms: chills and nausea   Associated symptoms: no chest pain, no congestion, no cough, no diarrhea, no headaches, no rash, no rhinorrhea and no vomiting     Past Medical History  Diagnosis Date  . Hypertension   . Sarcoidosis ([place]) STABLE PER CXR JUNE 2013  . Long-term current use of steroids SYMBICORT INHALER  . Allergic rhinitis   . HTN (hypertension)   . Iron deficiency anemia   . COPD, mild ([place]) FOLLOWED BY DR Melvyn Novas  . Sickle cell trait ([place])   . 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 Shortness of breath   . GERD (gastroesophageal reflux disease)    Past Surgical History  Procedure Laterality Date  . Cesarean section  1986    W/ BILATERAL TUBAL LIGATION  . Total robotic assisted laparoscopic hysterectomy  12-30-2010    SYMPTOMATIC UTERINE FIBROIDS  . Upper teeth extraction's  1992  . Median rectus repair  05/22/2012    Procedure: MEDIAN RECTUS REPAIR;  Surgeon: Dara Hoyer, MD;  [facility];  Service: Ophthalmology;  Laterality: Bilateral;  INFERIOR RECTUS  RESECTION WITH ADJUSTIBLE SUTURES RIGHT EYE   . Adjustable suture manipulation  05/22/2012    Procedure: ADJUSTABLE SUTURE MANIPULATION;  Surgeon: Dara Hoyer, MD;  Location: [facility];  Service: Ophthalmology;  Laterality: Right;  . Eye surgery    . Radioactive seed guided mastectomy with axillary sentinel lymph node biopsy Right 08/18/2015    Procedure: RADIOACTIVE SEED GUIDED PARTIAL MASTECTOMY WITH AXILLARY SENTINEL LYMPH NODE BIOPSY;  Surgeon: Stark Klein, MD;  Location: [place];  Service: General;  Laterality: Right;   Family History  Problem Relation Age of Onset  . Hyperlipidemia Mother   . Asthma Mother   . Lupus Mother   . Hypertension Father   . Hyperlipidemia Father   . Hypertension Sister   . Hypertension Brother   . Cancer Neg Hx   . Diabetes Neg Hx   . Coronary artery disease Neg Hx   . Breast cancer Mother 62   Social History  Substance Use Topics  . Smoking status: Former Smoker -- 1.00 packs/day for 20 years    Quit date: 11/14/2003  . Smokeless tobacco: Never Used  . Alcohol Use: No   OB History    No data available     Review of Systems  Constitutional: Positive for fever, chills and fatigue. Negative for diaphoresis.  HENT: Negative for congestion, rhinorrhea and sneezing.   Eyes: Negative.   Respiratory: Negative for cough, chest tightness and shortness of breath.   Cardiovascular:  Negative for chest pain and leg swelling.  Gastrointestinal: Positive for nausea. Negative for vomiting, abdominal pain, diarrhea and blood in stool.  Genitourinary: Negative for frequency, hematuria, flank pain and difficulty urinating.  Musculoskeletal: Negative for back pain and arthralgias.  Skin: Negative for rash.  Neurological: Negative for dizziness, speech difficulty, weakness, numbness and headaches.      Allergies  Codeine  Home Medications   Prior to Admission medications   Medication Sig Start Date End Date  Taking? Authorizing Provider  acetaminophen (TYLENOL) 500 MG tablet Take 1,000 mg by mouth every 6 (six) hours as needed for fever.   Yes Historical Provider, MD  albuterol (PROVENTIL HFA;VENTOLIN HFA) 108 (90 BASE) MCG/ACT inhaler Inhale 2 puffs into the lungs every 4 (four) hours as needed for wheezing or shortness of breath. For shortness of breath or wheezing 12/19/13  Yes Tanda Rockers, MD  aspirin EC 81 MG EC tablet Take 1 tablet (81 mg total) by mouth daily. 12/21/12  Yes Marin Olp, MD  budesonide-formoterol [facility]) 160-4.5 MCG/ACT inhaler Inhale 2 puffs into the lungs 2 (two) times daily. 08/25/15  Yes Tanda Rockers, MD  fluticasone (FLONASE) 50 MCG/ACT nasal spray Place 2 sprays into both nostrils daily as needed for allergies. 02/04/15  Yes Bryan R Hess, DO  hyaluronate sodium (RADIAPLEXRX) GEL Apply 1 application topically once.   Yes Historical Provider, MD  hydrochlorothiazide (HYDRODIURIL) 12.5 MG tablet Take 1 tablet (12.5 mg total) by mouth daily. 12/02/14  Yes Bryan R Hess, DO  metoprolol tartrate (LOPRESSOR) 25 MG tablet TAKE ONE TABLET BY MOUTH TWICE DAILY 08/02/15  Yes Mariel Aloe, MD  Multiple Vitamin (MULTIVITAMIN) capsule Take 1 capsule by mouth daily.   Yes Historical Provider, MD  omeprazole (PRILOSEC) 20 MG capsule TAKE ONE CAPSULE BY MOUTH ONCE DAILY. TAKE 30 TO 60 MINUTES BEFORE YOUR FIRST AND LAST MEALS OF THE DAY. 09/06/15  Yes Tanda Rockers, MD  traMADol (ULTRAM) 50 MG tablet Take 50 mg by mouth daily as needed.    Yes Historical Provider, MD  venlafaxine XR (EFFEXOR-XR) 37.5 MG 24 hr capsule Take 1 capsule (37.5 mg total) by mouth daily with breakfast. 08/27/15  Yes Nicholas Lose, MD  non-metallic deodorant Jethro Poling) MISC Apply 1 application topically daily as needed.    Historical Provider, MD   BP 124/82 mmHg  Pulse 104  Temp(Src) 99.6 F (37.6 C) (Oral)  Resp 19  Ht 5\' 5"  (1.651 m)  Wt 177 lb (80.287 kg)  BMI 29.45 kg/m2  SpO2 97% Physical Exam   Constitutional: She is oriented to person, place, and time. She appears well-developed and well-nourished.  HENT:  Head: Normocephalic and atraumatic.  Right Ear: External ear normal.  Left Ear: External ear normal.  Mouth/Throat: Oropharynx is clear and moist.  Eyes: Pupils are equal, round, and reactive to light.  Neck: Normal range of motion. Neck supple.  Cardiovascular: Normal rate, regular rhythm and normal heart sounds.   Pulmonary/Chest: Effort normal and breath sounds normal. No respiratory distress. She has no wheezes. She has no rales. She exhibits no tenderness.  Abdominal: Soft. Bowel sounds are normal. There is no tenderness. There is no rebound and no guarding.  Musculoskeletal: Normal range of motion. She exhibits no edema.  Lymphadenopathy:    She has no cervical adenopathy.  Neurological: She is alert and oriented to person, place, and time.  Skin: Skin is warm and dry. No rash noted.  Diffuse redness to right breast.  No induration  or fluctuance.  Healed incision to lateral aspect of breast, no drainage.  Mild tenderness to upper breast area.  Psychiatric: She has a normal mood and affect.    ED Course  Procedures (including critical care time) Labs Review Results for orders placed or performed during the hospital encounter of 10/08/15  Blood Culture (routine x 2)  Result Value Ref Range   Specimen Description BLOOD LEFT HAND    Special Requests      BOTTLES DRAWN AEROBIC AND ANAEROBIC 5CC Performed at [facility]    Culture PENDING    Report Status PENDING   Comprehensive metabolic panel  Result Value Ref Range   Sodium 137 135 - 145 mmol/L   Potassium 3.7 3.5 - 5.1 mmol/L   Chloride 101 101 - 111 mmol/L   CO2 30 22 - 32 mmol/L   Glucose, Bld 110 (H) 65 - 99 mg/dL   BUN 9 6 - 20 mg/dL   Creatinine, Ser 0.62 0.44 - 1.00 mg/dL   Calcium 9.1 8.9 - 10.3 mg/dL   Total Protein 7.8 6.5 - 8.1 g/dL   Albumin 3.8 3.5 - 5.0 g/dL   AST 18 15 - 41 U/L    ALT 12 (L) 14 - 54 U/L   Alkaline Phosphatase 72 38 - 126 U/L   Total Bilirubin 0.5 0.3 - 1.2 mg/dL   GFR calc non Af Amer >60 >60 mL/min   GFR calc Af Amer >60 >60 mL/min   Anion gap 6 5 - 15  CBC WITH DIFFERENTIAL  Result Value Ref Range   WBC 17.0 (H) 4.0 - 10.5 K/uL   RBC 4.52 3.87 - 5.11 MIL/uL   Hemoglobin 12.1 12.0 - 15.0 g/dL   HCT 36.1 36.0 - 46.0 %   MCV 79.9 78.0 - 100.0 fL   MCH 26.8 26.0 - 34.0 pg   MCHC 33.5 30.0 - 36.0 g/dL   RDW 15.6 (H) 11.5 - 15.5 %   Platelets 299 150 - 400 K/uL   Neutrophils Relative % 90 %   Neutro Abs 15.4 (H) 1.7 - 7.7 K/uL   Lymphocytes Relative 3 %   Lymphs Abs 0.4 (L) 0.7 - 4.0 K/uL   Monocytes Relative 7 %   Monocytes Absolute 1.2 (H) 0.1 - 1.0 K/uL   Eosinophils Relative 0 %   Eosinophils Absolute 0.0 0.0 - 0.7 K/uL   Basophils Relative 0 %   Basophils Absolute 0.0 0.0 - 0.1 K/uL  Urinalysis, Routine w reflex microscopic (not at [facility])  Result Value Ref Range   Color, Urine YELLOW YELLOW   APPearance CLOUDY (A) CLEAR   Specific Gravity, Urine 1.015 1.005 - 1.030   pH 8.5 (H) 5.0 - 8.0   Glucose, UA NEGATIVE NEGATIVE mg/dL   Hgb urine dipstick NEGATIVE NEGATIVE   Bilirubin Urine NEGATIVE NEGATIVE   Ketones, ur NEGATIVE NEGATIVE mg/dL   Protein, ur NEGATIVE NEGATIVE mg/dL   Nitrite NEGATIVE NEGATIVE   Leukocytes, UA NEGATIVE NEGATIVE  I-Stat CG4 Lactic Acid, ED  (not at  [facility])  Result Value Ref Range   Lactic Acid, Venous 1.32 0.5 - 2.0 mmol/L  I-Stat CG4 Lactic Acid, ED  (not at  [facility])  Result Value Ref Range   Lactic Acid, Venous 0.38 (L) 0.5 - 2.0 mmol/L   Dg Chest 2 View  10/08/2015  CLINICAL DATA:  Fever EXAM: CHEST  2 VIEW COMPARISON:  04/13/2014 FINDINGS: Bibasilar scarring is unchanged. Negative for pneumonia. Negative for heart failure or pleural effusion. No  mass lesion Interval placement of surgical clips in the right axilla from lymph node resection. IMPRESSION: Chronic lung disease with moderately severe bilateral  scarring. No superimposed acute abnormality. Electronically Signed   By: Franchot Gallo M.D.   On: 10/08/2015 13:26      Imaging Review Dg Chest 2 View  10/08/2015  CLINICAL DATA:  Fever EXAM: CHEST  2 VIEW COMPARISON:  04/13/2014 FINDINGS: Bibasilar scarring is unchanged. Negative for pneumonia. Negative for heart failure or pleural effusion. No mass lesion Interval placement of surgical clips in the right axilla from lymph node resection. IMPRESSION: Chronic lung disease with moderately severe bilateral scarring. No superimposed acute abnormality. Electronically Signed   By: Franchot Gallo M.D.   On: 10/08/2015 13:26   I have personally reviewed and evaluated these images and lab results as part of my medical decision-making.   EKG Interpretation None      MDM   Final diagnoses:  Cellulitis of breast  Sepsis, due to unspecified organism [facility])    Patient presents with fever. She does meet criteria for sepsis given her tachycardia, fever, elevated white blood cell count. However lactate is normal. She initially was treated with sepsis protocol with antibiotics for likely cellulitis as origin. I don't find any other evidence of infection. Her blood pressure stable.  I spoke with the family medicine resident at [facility] who accepts the patient for [facility] for [facility]. Patient's PCP is a family practice center.  Malvin Johns, MD 10/08/15 [phone]

## 2015-10-08 NOTE — ED Notes (Signed)
Nurse drawing labs. 

## 2015-10-08 NOTE — ED Notes (Signed)
Per EMS pt is right Breast cancer pt on radiation for 3 weeks with 2 weeks left; nausea chills body aches and temp at 102; EMS 320 tylenol and tylenol 100mg  at 4am; notable weakness; hx  sarcoidosis

## 2015-10-08 NOTE — ED Notes (Signed)
EMS reported that they wanted to give the patient Tylenol 1000 mg ,but patient refused and only took 320 mg of Tylenol.

## 2015-10-08 NOTE — H&P (Signed)
[facility] Admission History and Physical Service Pager: [phone]  Patient name: [name] Medical record number: [id number] Date of birth: 02-Apr-1961 Age: 54 y.o. Gender: female  Primary Care Provider: Cordelia Poche, MD Consultants: none Code Status: FULL  Chief Complaint: fevers and body aches found to have cellulitis of R breast   Assessment and Plan: ZION LINT is a 54 y.o. female presenting from [facility] for cellulitis of the right breast in the setting of radiation therapy for history of breast cancer s/p lumpectomy 08/18/2015. 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  Cellulitis of Right Breast in the Setting of Radiation Therapy for Breast Cancer: No fluctuance noted on exam to be concerned for abscess. Meets SIRS criteria with leukocytosis (17K) with neutrophilia, tachycardia, and fever (102.8F) on initial presentation at [facility].  Patient's blood pressure is stable. Lactic acid normal. No other sources of infection as CXR shows no signs of acute infiltrates. UA was unremarkable for sign of infection. S/p 1.5 L IVF and Zosyn/Vanc x 1 at [place] - admit to med-surg, attending Dr. Mingo Amber.  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 - cont Hyaluronate gel at bedtime   COPD GOLD III: stable, lung exam normal - cont Symbicort -  Albuterol PRN   Sarcoidosis: lung exam unremarkable - not on any maintenance med currently   GERD: - Protonix  Hx Iron Deficiency Anemia: hgb nml at 12.1  FEN/GI: heart diet Prophylaxis: Lovenox  Disposition: Admit to teaching service for management of R breast cellulitis (meetin SIRS criteria),   History of Present Illness:  [name] a 54 43o. female presenting [facility] cell[facility]itis of the right breast in the setting of radiation therapy for history of breast cancer s/p lumpectomy 08/18/2015. Patient called EMS to her house the morning of admission because she had body aches and fever of 102.59F. Notes that she has been getting radiation therapy of her right breast, and states that this area has been warm and sore to touch for the past few days. Last radiation therapy on 11/23. Only took Tylenol for fever, which helped. Notes nausea, but denies vomiting or diarrhea. Notes intermittent shortness of breath for which she uses her inhaler; she attributes this to her chronic sarcoidosis. She does not have a home O2 requirement, nor did she have one on presentation.   Denies cough. Notes of headache which she thinks is due to not eating the entire day; she has an appetite and is currently hungry. She has not taken any medications today.   She also notes a skin tag on her mid chest that is painful to touch. She believe she may have snagged it on something, potentially while dressing  Patient gets radiation 5 days a week. Next treatment is 11/28.   Denies tobacco, alcohol, or other drug use. Lives with her brother.  Review Of Systems: Per HPI  Otherwise the remainder of the systems were negative.  Patient Active Problem List   Diagnosis Date Noted  . Cellulitis 10/08/2015  . UTI (urinary tract infection) 08/31/2015  . Breast cancer of upper-outer quadrant of right female breast ([place]) 08/04/2015  . Tibial pain 06/03/2015  . Rotator cuff impingement syndrome 09/14/2014  . Menopausal  syndrome (hot flashes) 01/19/2014  . GERD (gastroesophageal reflux disease) 12/21/2012  . Hyperlipidemia 12/21/2012  . SICKLE-CELL TRAIT 04/15/2008  . Sarcoidosis ([place]) 01/10/2007  . ANEMIA, IRON DEFICIENCY, UNSPEC. 01/10/2007  . HYPERTENSION, BENIGN SYSTEMIC 01/10/2007  . COPD  GOLD III 01/10/2007    Past Medical History: Past Medical History  Diagnosis Date  . Hypertension   . Sarcoidosis ([place]) STABLE PER CXR JUNE 2013  . Long-term current use of steroids SYMBICORT INHALER  . Allergic rhinitis   . HTN (hypertension)   . Iron deficiency anemia   . COPD, mild ([place]) FOLLOWED BY DR Melvyn Novas  . Sickle cell trait ([place])   . 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 Shortness of breath   . GERD (gastroesophageal reflux disease)     Past Surgical History: Past Surgical History  Procedure Laterality Date  . Cesarean section  1986    W/ BILATERAL TUBAL LIGATION  . Total robotic assisted laparoscopic hysterectomy  12-30-2010    SYMPTOMATIC UTERINE FIBROIDS  . Upper teeth extraction's  1992  . Median rectus repair  05/22/2012    Procedure: MEDIAN RECTUS REPAIR;  Surgeon: Dara Hoyer, MD;  Location: [facility];  Service: Ophthalmology;  Laterality: Bilateral;  INFERIOR RECTUS RESECTION WITH ADJUSTIBLE SUTURES RIGHT EYE   . Adjustable suture manipulation  05/22/2012    Procedure: ADJUSTABLE SUTURE MANIPULATION;  Surgeon: Dara Hoyer, MD;  Location: [facility];  Service: Ophthalmology;  Laterality: Right;  . Eye surgery    . Radioactive seed guided mastectomy with axillary sentinel lymph node biopsy Right 08/18/2015    Procedure: RADIOACTIVE SEED GUIDED PARTIAL MASTECTOMY WITH AXILLARY SENTINEL LYMPH NODE BIOPSY;  Surgeon: Stark Klein, MD;  Location: [place];  Service: General;  Laterality: Right;    Social History: Social History  Substance Use Topics  . Smoking status: Former Smoker -- 1.00 packs/day for 20 years    Quit date: 11/14/2003  .  Smokeless tobacco: Never Used  . Alcohol Use: No   Family History: Family History  Problem Relation Age of Onset  . Hyperlipidemia Mother   . Asthma Mother   . Lupus Mother   . Hypertension Father   . Hyperlipidemia Father   . Hypertension Sister   . Hypertension Brother   . Cancer Neg Hx   . Diabetes Neg Hx   . Coronary artery disease Neg Hx   . Breast cancer Mother 65   Allergies and Medications: Allergies  Allergen Reactions  . Codeine Other (See Comments)    Avoids-felt bad when took before   No current facility-administered medications on file prior to encounter.   Current Outpatient Prescriptions on File Prior to Encounter  Medication Sig Dispense Refill  . albuterol (PROVENTIL HFA;VENTOLIN HFA) 108 (90 BASE) MCG/ACT inhaler Inhale 2 puffs into the lungs every 4 (four) hours as needed for wheezing or shortness of breath. For shortness of breath or wheezing 1 Inhaler 1  . aspirin EC 81 MG EC tablet Take 1 tablet (81 mg total) by mouth daily. 30 tablet 11  . budesonide-formoterol (SYMBICORT) 160-4.5 MCG/ACT inhaler Inhale 2 puffs into  the lungs 2 (two) times daily. 1 Inhaler 4  . fluticasone (FLONASE) 50 MCG/ACT nasal spray Place 2 sprays into both nostrils daily as needed for allergies. 16 g 11  . hyaluronate sodium (RADIAPLEXRX) GEL Apply 1 application topically once.    . hydrochlorothiazide (HYDRODIURIL) 12.5 MG tablet Take 1 tablet (12.5 mg total) by mouth daily. 90 tablet 3  . metoprolol tartrate (LOPRESSOR) 25 MG tablet TAKE ONE TABLET BY MOUTH TWICE DAILY 60 tablet 5  . Multiple Vitamin (MULTIVITAMIN) capsule Take 1 capsule by mouth daily.    Marland Kitchen omeprazole (PRILOSEC) 20 MG capsule TAKE ONE CAPSULE BY MOUTH ONCE DAILY. TAKE 30 TO 60 MINUTES BEFORE YOUR FIRST AND LAST MEALS OF THE DAY. 60 capsule 0  . traMADol (ULTRAM) 50 MG tablet Take 50 mg by mouth daily as needed.     . venlafaxine XR (EFFEXOR-XR) 37.5 MG 24 hr capsule Take 1 capsule (37.5 mg total) by mouth daily  with breakfast. 30 capsule 11  . non-metallic deodorant (ALRA) MISC Apply 1 application topically daily as needed.      Objective: BP 145/84 mmHg  Pulse 106  Temp(Src) 99.6 F (37.6 C) (Oral)  Resp 18  Ht 5' 5"  (1.651 m)  Wt 182 lb 15.7 oz (83 kg)  BMI 30.45 kg/m2  SpO2 94% Exam: General: NAD Eyes: PERRL, EMOI ENTM: Dry MM Chest: Right breast with mild erythema on the anterior portion of breast (area demarcated at [place]), increased warmth to touch, some tenderness to touch; no sign of skin breakdown. No fluctuance noted on exam. Skin tag on central chest, with small area of separation at the base ad surrounding erythema, tender to touch Cardiovascular: RRR, no m/r/g; peripheral pulses intact  Respiratory: CTAB, normal effort, no crackles or wheezing  Abdomen: soft, NT, ND, + bowel sounds MSK: moves all extremities spontaneously, no LE edema, no calf tenderness Skin: warm and dry; R breast findings noted above PSYCH: Mood and affect euthymic, normal rate and volume of speech NEURO: Awake, alert, no focal deficits grossly, normal speech  Labs and Imaging: CBC BMET   Recent Labs Lab 10/08/15 1121  WBC 17.0*  HGB 12.1  HCT 36.1  PLT 299    Recent Labs Lab 10/08/15 1121  NA 137  K 3.7  CL 101  CO2 30  BUN 9  CREATININE 0.62  GLUCOSE 110*  CALCIUM 9.1      CXR 11/25   Chronic lung disease with moderately severe bilateral scarring. No superimposed acute abnormality.  Smiley Houseman, MD 10/08/2015, 6:22 PM PGY-1, [place] Intern pager: [phone], text pages welcome   I have seen and examined the patient. I have read and agree with the above note. My changes are noted in blue.  Zylon Creamer A. Lincoln Brigham MD, [place] [facility] Resident PGY-2 Pager [phone]

## 2015-10-08 NOTE — ED Notes (Signed)
Patient transported to X-ray 

## 2015-10-08 NOTE — ED Notes (Signed)
Area of cellulitis outlined with skin marker

## 2015-10-08 NOTE — Progress Notes (Signed)
ANTIBIOTIC CONSULT NOTE - INITIAL  Pharmacy Consult for Vancomycin, Zosyn Indication: Cellulitis  Allergies  Allergen Reactions  . Codeine Other (See Comments)    Avoids-felt bad when took before    Patient Measurements: Height: 5\' 5"  (165.1 cm) Weight: 177 lb (80.287 kg) IBW/kg (Calculated) : 57  Vital Signs: Temp: 102.2 F (39 C) (11/25 1102) Temp Source: Oral (11/25 1102) BP: 116/83 mmHg (11/25 1221) Pulse Rate: 118 (11/25 1221) Intake/Output from previous day:   Intake/Output from this shift:    Labs:  Recent Labs  10/08/15 1121  WBC 17.0*  HGB 12.1  PLT 299  CREATININE 0.62   Estimated Creatinine Clearance: 83.2 mL/min (by C-G formula based on Cr of 0.62). No results for input(s): VANCOTROUGH, VANCOPEAK, VANCORANDOM, GENTTROUGH, GENTPEAK, GENTRANDOM, TOBRATROUGH, TOBRAPEAK, TOBRARND, AMIKACINPEAK, AMIKACINTROU, AMIKACIN in the last 72 hours.   Microbiology: No results found for this or any previous visit (from the past 720 hour(s)).  Medical History: Past Medical History  Diagnosis Date  . Hypertension   . Sarcoidosis ([place]) STABLE PER CXR JUNE 2013  . Long-term current use of steroids SYMBICORT INHALER  . Allergic rhinitis   . HTN (hypertension)   . Iron deficiency anemia   . COPD, mild ([place]) FOLLOWED BY DR Melvyn Novas  . Sickle cell trait ([place])   . 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 Shortness of breath   . GERD (gastroesophageal reflux disease)     Assessment: 48 y/oF with PMH of right-sided breast cancer s/p lumpectomy in October currently on radiation therapy who presents to [facility] ED with fever, soreness and diffuse redness to her right breast. Pharmacy consulted to assist with dosing of Vancomycin and Zosyn for cellulitis.   11/25 >> Vancomycin >> 11/25 >> Zosyn >>    11/25 blood x 2: sent 11/25 urine: sent  Tmax: 102.16F WBC: elevated at 17K SCr 0.62 with CrCl ~ 83 ml/min CG Lactic Acid: 1.32  Goal of Therapy:  Vancomycin trough level 10-15 mcg/ml   Appropriate antibiotic dosing for renal function and indication Eradication of infection  Plan:   Vancomycin 1g IV q12h.  Plan for Vancomycin trough level at steady state.  Zosyn 3.375g IV x 1 over 30 minutes in the ED, then Zosyn 3.375g IV q8h (infuse over 4 hours).  Monitor renal function, cultures, clinical course.   Lindell Spar, PharmD, BCPS Pager: [phone] 10/08/2015 12:44 PM

## 2015-10-08 NOTE — Progress Notes (Signed)
Pt admitted to floor at 1815, pt was settled in and given even meds, fluids started.

## 2015-10-09 ENCOUNTER — Encounter (HOSPITAL_COMMUNITY): Payer: Self-pay

## 2015-10-09 DIAGNOSIS — E876 Hypokalemia: Secondary | ICD-10-CM | POA: Insufficient documentation

## 2015-10-09 DIAGNOSIS — A419 Sepsis, unspecified organism: Secondary | ICD-10-CM | POA: Insufficient documentation

## 2015-10-09 DIAGNOSIS — N61 Mastitis without abscess: Secondary | ICD-10-CM | POA: Insufficient documentation

## 2015-10-09 LAB — CBC
HEMATOCRIT: 34 % — AB (ref 36.0–46.0)
HEMOGLOBIN: 11 g/dL — AB (ref 12.0–15.0)
MCH: 25.9 pg — ABNORMAL LOW (ref 26.0–34.0)
MCHC: 32.4 g/dL (ref 30.0–36.0)
MCV: 80.2 fL (ref 78.0–100.0)
Platelets: 244 10*3/uL (ref 150–400)
RBC: 4.24 MIL/uL (ref 3.87–5.11)
RDW: 15.7 % — AB (ref 11.5–15.5)
WBC: 11.9 10*3/uL — AB (ref 4.0–10.5)

## 2015-10-09 LAB — URINE CULTURE

## 2015-10-09 LAB — BASIC METABOLIC PANEL
ANION GAP: 5 (ref 5–15)
BUN: 5 mg/dL — AB (ref 6–20)
CHLORIDE: 106 mmol/L (ref 101–111)
CO2: 27 mmol/L (ref 22–32)
Calcium: 8 mg/dL — ABNORMAL LOW (ref 8.9–10.3)
Creatinine, Ser: 0.66 mg/dL (ref 0.44–1.00)
GFR calc Af Amer: >60 mL/min (ref >60)
GFR calc non Af Amer: >60 mL/min (ref >60)
GLUCOSE: 95 mg/dL (ref 65–99)
POTASSIUM: 3.2 mmol/L — AB (ref 3.5–5.1)
SODIUM: 138 mmol/L (ref 135–145)

## 2015-10-09 MED ORDER — POTASSIUM CHLORIDE CRYS ER 20 MEQ PO TBCR
40.0000 meq | EXTENDED_RELEASE_TABLET | Freq: Two times a day (BID) | ORAL | Status: AC
  Administered 2015-10-09 (×2): 40 meq via ORAL
  Filled 2015-10-09 (×2): qty 2

## 2015-10-09 NOTE — Progress Notes (Signed)
Family Medicine Teaching Service Daily Progress Note Intern Pager: [phone]  Patient name: [name] Medical record number: [id number] Date of birth: 01/15/1961 Age: 54 y.o. Gender: female  Primary Care Provider: Ralph Nettey, MD Consultants: none Code Status: Full  Pt Overview and Major Events to Date:  11/25: admitted for Rt sided breast cellulitis, in the setting of radiation therapy in the same breast.  Assessment and Plan: Nikhita A Fassnacht is a 54 y.o. female presenting from [facility] for cellulitis of the right breast in the setting of radiation therapy for history of breast cancer s/p lumpectomy 08/18/2015. 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  Cellulitis of Right Breast in the Setting of Radiation Therapy for Breast Cancer: No fluctuance noted on exam to be concerned for abscess. Lactic acid normal.  S/p 1.5 L IVF and Zosyn/Vanc x 1 at [facility] - IV Vancomycin and Zosyn >> will transition to PO abx later today - f/u blood cultures and urine culture from [facility]  - will monitor demarcation of erythema  - KVO IVF - Tylenol PRN fever/HA - Tramadol PRN for pain   Sepsis 2/2 cellulits: Resolved; Met SIRS criteria w/ WBC 17.0, tachycardia, and fever. No other sources of infection as CXR shows no signs of acute infiltrates. UA was unremarkable for sign of infection. - Blood culture pending - On IV abx; will transition to PO today.  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 - cont Hyaluronate gel at bedtime  - Will contact Dr. Gudena  (patient's oncologist) to inform them of her hospitalization.   COPD GOLD III: stable, lung exam normal - cont Symbicort - Albuterol PRN   Sarcoidosis: lung exam unremarkable - not on any maintenance med currently   GERD: - Protonix  Hx Iron Deficiency Anemia: hgb nml at 12.1, MCV 80.2  FEN/GI: heart diet Prophylaxis: Lovenox  Disposition: Home likely tomorrow  Subjective:  Patient continues to have some discomfort at this time. Feels "a little better" than yesterday. No issues overnight.   Objective: Temp:  [98.3 F (36.8 C)-102.2 F (39 C)] 99.2 F (37.3 C) (11/26 0554) Pulse Rate:  [88-127] 100 (11/26 0554) Resp:  [15-20] 20 (11/26 0336) BP: (116-150)/(70-89) 121/75 mmHg (11/26 0554) SpO2:  [93 %-100 %] 97 % (11/26 0805) Weight:  [177 lb (80.287 kg)-182 lb 15.7 oz (83 kg)] 182 lb 15.7 oz (83 kg) (11/25 1810) Physical Exam: General: NAD, sitting comfortably in bed. Integument: Right breast with mild erythema on the anterior portion of breast (area demarcated at [facility] hospital), increased warmth and tenderness; no ulceration or fluctuance noted. Cardiovascular: RRR; peripheral pulses intact  Respiratory: CTAB  MSK: moves all extremities spontaneously, no LE edema, no calf tenderness  Laboratory:  Recent Labs Lab 10/08/15 1121 10/08/15 1927 10/09/15 0630  WBC 17.0* 15.1* 11.9*  HGB 12.1 11.2* 11.0*  HCT 36.1 33.8* 34.0*  PLT 299 257 244    Recent Labs Lab 10/08/15 1121 10/08/15 1927 10/09/15 0630  NA 137  --  138  K 3.7  --  3.2*  CL 101  --  106  CO2 30  --    27  BUN 9  --  5*  CREATININE 0.62 0.72 0.66  CALCIUM 9.1  --  8.0*  PROT 7.8  --   --   BILITOT 0.5  --   --   ALKPHOS 72  --   --   ALT 12*  --   --   AST 18  --   --   GLUCOSE 110*  --  95    Imaging/Diagnostic Tests: CXR 11/25 IMPRESSION: Chronic lung disease with moderately severe bilateral scarring. No superimposed acute abnormality.   Ian D McKeag, MD 10/09/2015, 9:59  AM PGY-2, [facility] FPTS Intern pager: [phone], text pages welcome  

## 2015-10-10 LAB — BASIC METABOLIC PANEL
Anion gap: 6 (ref 5–15)
BUN: <5 mg/dL — ABNORMAL LOW (ref 6–20)
CALCIUM: 8.6 mg/dL — AB (ref 8.9–10.3)
CHLORIDE: 103 mmol/L (ref 101–111)
CO2: 27 mmol/L (ref 22–32)
CREATININE: 0.67 mg/dL (ref 0.44–1.00)
GFR calc non Af Amer: >60 mL/min (ref >60)
GLUCOSE: 138 mg/dL — AB (ref 65–99)
Potassium: 3.8 mmol/L (ref 3.5–5.1)
Sodium: 136 mmol/L (ref 135–145)

## 2015-10-10 MED ORDER — NYSTATIN 100000 UNIT/GM EX POWD
Freq: Three times a day (TID) | CUTANEOUS | Status: DC
  Administered 2015-10-10 – 2015-10-11 (×3): via TOPICAL
  Filled 2015-10-10: qty 15

## 2015-10-10 MED ORDER — GENTIAN VIOLET 1 % EX SOLN
0.5000 mL | Freq: Three times a day (TID) | CUTANEOUS | Status: DC
  Filled 2015-10-10: qty 59

## 2015-10-10 MED ORDER — DOXYCYCLINE HYCLATE 100 MG PO TABS
100.0000 mg | ORAL_TABLET | Freq: Two times a day (BID) | ORAL | Status: DC
  Administered 2015-10-10: 100 mg via ORAL
  Filled 2015-10-10: qty 1

## 2015-10-10 MED ORDER — CLINDAMYCIN PHOSPHATE 600 MG/50ML IV SOLN
600.0000 mg | Freq: Three times a day (TID) | INTRAVENOUS | Status: DC
  Administered 2015-10-10 – 2015-10-11 (×3): 600 mg via INTRAVENOUS
  Filled 2015-10-10 (×3): qty 50

## 2015-10-10 NOTE — Progress Notes (Signed)
Family Medicine Teaching Service Daily Progress Note Intern Pager: [phone]  Patient name: [name] Medical record number: [id number] Date of birth: 02-08-1961 Age: 54 y.o. Gender: female  Primary Care Provider: Cordelia Poche, MD Consultants: none Code Status: Full  Assessment and Plan: 54 y.o. female presenting from [facility] for cellulitis of the right breast in the setting of radiation therapy for history of breast cancer s/p lumpectomy 08/18/2015. 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  # Cellulitis of Right Breast in the Setting of Radiation Therapy for Breast Cancer:  S/p 1.5 L IVF and Zosyn/Vanc x 1 at [facility]. WBC improving 17>15.1>11.9. - IV Vancomycin and Zosyn (11/25>>11/27). Transition to oral antibiotics today. - Urine and blood cultures without significant growth  - KVO IVF - Tylenol PRN fever/headache - Tramadol PRN for pain  - Gentian violet under right breast for intertrigo.  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 - Cont Hyaluronate gel at bedtime  - Will contact Dr. Lindi Adie (patient's oncologist) to inform them of her hospitalization on Monday  FEN/GI: heart diet Prophylaxis: Lovenox  Disposition: Home likely tomorrow  Subjective:  Notes mild improvement in swelling and pain, however still notes significant pain especially on lateral aspect. Unable to say if redness has improved. States the skin under her breast has started to burn and has become raw. Denies fevers. No further  concerns today.   Objective: Temp:  [98.1 F (36.7 C)-99.8 F (37.7 C)] 98.1 F (36.7 C) (11/27 0611) Pulse Rate:  [87-92] 87 (11/27 0611) Resp:  [16-19] 16 (11/26 2121) BP: (126-144)/(73-77) 144/77 mmHg (11/27 0611) SpO2:  [92 %-97 %] 92 % (11/27 0131) Physical Exam: General: NAD, sitting comfortably in bed. Integument: mild erythema of anterior and lateral breast improved from demarcated area,warmth and tenderness noted especially along lateral aspect; no ulceration or fluctuance noted. Cardiovascular: RRR, no murmurs noted; peripheral pulses intact  Respiratory: CTAB, no increased work of breathing noted MSK: moves all extremities spontaneously, no LE edema, no calf tenderness  Laboratory:  Recent Labs Lab 10/08/15 1121 10/08/15 1927 10/09/15 0630  WBC 17.0* 15.1* 11.9*  HGB 12.1 11.2* 11.0*  HCT 36.1 33.8* 34.0*  PLT 299 257 244    Recent Labs Lab 10/08/15 1121 10/08/15 1927 10/09/15 0630  NA 137  --  138  K 3.7  --  3.2*  CL 101  --  106  CO2 30  --  27  BUN 9  --  5*  CREATININE 0.62 0.72 0.66  CALCIUM 9.1  --  8.0*  PROT 7.8  --   --   BILITOT 0.5  --   --   ALKPHOS 72  --   --   ALT 12*  --   --   AST 18  --   --   GLUCOSE 110*  --  95    Imaging/Diagnostic Tests: CXR 11/25 IMPRESSION: Chronic lung disease with moderately severe bilateral scarring. No superimposed acute abnormality.  [place], [place] 10/10/2015, 7:26 AM PGY-2, [place] Intern pager: [phone], text pages welcome

## 2015-10-11 ENCOUNTER — Ambulatory Visit
Admission: RE | Admit: 2015-10-11 | Discharge: 2015-10-11 | Disposition: A | Discharge status: Home / Self Care | Payer: BLUE CROSS/BLUE SHIELD | Source: Ambulatory Visit | Attending: Radiation Oncology | Admitting: Radiation Oncology

## 2015-10-11 ENCOUNTER — Encounter: Payer: Self-pay | Admitting: Radiation Oncology

## 2015-10-11 VITALS — BP 151/84 | HR 103 | Temp 100.2°F

## 2015-10-11 DIAGNOSIS — C50411 Malignant neoplasm of upper-outer quadrant of right female breast: Secondary | ICD-10-CM

## 2015-10-11 DIAGNOSIS — Z510 Encounter for antineoplastic radiation therapy: Secondary | ICD-10-CM | POA: Diagnosis not present

## 2015-10-11 LAB — BASIC METABOLIC PANEL
Anion gap: 6 (ref 5–15)
CHLORIDE: 103 mmol/L (ref 101–111)
CO2: 31 mmol/L (ref 22–32)
CREATININE: 0.56 mg/dL (ref 0.44–1.00)
Calcium: 9 mg/dL (ref 8.9–10.3)
GFR calc Af Amer: >60 mL/min (ref >60)
GFR calc non Af Amer: >60 mL/min (ref >60)
Glucose, Bld: 101 mg/dL — ABNORMAL HIGH (ref 65–99)
Potassium: 3.5 mmol/L (ref 3.5–5.1)
SODIUM: 140 mmol/L (ref 135–145)

## 2015-10-11 LAB — CBC
HCT: 33.5 % — ABNORMAL LOW (ref 36.0–46.0)
Hemoglobin: 11.4 g/dL — ABNORMAL LOW (ref 12.0–15.0)
MCH: 27 pg (ref 26.0–34.0)
MCHC: 34 g/dL (ref 30.0–36.0)
MCV: 79.2 fL (ref 78.0–100.0)
PLATELETS: 271 10*3/uL (ref 150–400)
RBC: 4.23 MIL/uL (ref 3.87–5.11)
RDW: 15.4 % (ref 11.5–15.5)
WBC: 8 10*3/uL (ref 4.0–10.5)

## 2015-10-11 MED ORDER — CLINDAMYCIN HCL 300 MG PO CAPS: 300.0000 mg | ORAL_CAPSULE | Freq: Three times a day (TID) | ORAL | Status: DC

## 2015-10-11 NOTE — Progress Notes (Signed)
[name] is here for her radiation treatment. She was just released from the hospital today for an infection in her Right Breast. She has been on IV antibiotics in the hospital and now will start clindamycin by mouth. She feels very fatigued, and is not eating well, though she states she is drinking well. Her Right Breast is reddened, and was marked while she was in the hospital to evaluate for the redness spreading. The breast is also hyperpigmented anteriorly and underneath her breast in a small area. She is using her radiaplex cream and I enouraged her to use it underneath her breast.  She does have a temperature of 100.2 today, and looks like she is not feeling well.   BP 151/84 mmHg  Pulse 103  Temp(Src) 100.2 F (37.9 C)  SpO2 94%   Wt Readings from Last 3 Encounters:  10/08/15 182 lb 15.7 oz (83 kg)  10/04/15 177 lb (80.287 kg)  09/27/15 177 lb 11.2 oz (80.604 kg)

## 2015-10-11 NOTE — Progress Notes (Addendum)
   Weekly Management Note:  Outpatient    ICD-9-CM ICD-10-CM   1. Breast cancer of upper-outer quadrant of right female breast ([place]) 174.4 C50.411     Current Dose:  34.71 Gy  Projected Dose: 50.05 Gy   Narrative:  The patient presents for routine under treatment assessment.  CBCT/MVCT images/Port film x-rays were reviewed.  The chart was checked. I spoke with her hospitalist this AM due to admission over the weekend for fever and possible breast cellulitis.  discharged today on oral clindamycin.  Low grade temp now.  Skin has improved per MD and per patient. She reports swelling, pain at seroma site.  Physical Findings:  temperature is 100.2 F (37.9 C). Her blood pressure is 151/84 and her pulse is 103. Her oxygen saturation is 94%.   Wt Readings from Last 3 Encounters:  10/08/15 182 lb 15.7 oz (83 kg)  10/04/15 177 lb (80.287 kg)  09/27/15 177 lb 11.2 oz (80.604 kg)   Hyperpigmentation and erythema and warmth of right breast, but not excessive given history of radiation.   Seroma in UOQ of right breast, tender.  Impression:  The patient is tolerating radiotherapy.  Plan:  Continue radiotherapy as planned.  I talked with Dr. Barry Dienes, she will schedule patient for evaluation and possible seroma aspiration in case there is a persistent infection there.  ________________________________   Eppie Gibson, M.D.

## 2015-10-11 NOTE — Progress Notes (Signed)
Family Medicine Teaching Service Daily Progress Note Intern Pager: [phone]  Patient name: [name] Medical record number: [id number] Date of birth: Sep 27, 1961 Age: 54 y.o. Gender: female  Primary Care Provider: Cordelia Poche, MD Consultants: none Code Status: Full  Assessment and Plan: 54 y.o. female presenting from [facility] for cellulitis of the right breast in the setting of radiation therapy for history of breast cancer s/p lumpectomy 08/18/2015. 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  # Cellulitis of Right Breast in the Setting of Radiation Therapy for Breast Cancer:  Afebrile overnight. WBC have improved from 17.0 on admission to 8.0 today. On exam, the edges of the cellulitis have retracted from the marker borders. - IV Vancomycin and Zosyn (11/25>>11/27). Pt was transitioned to IV Clindamycin on 11/27. Will transition to PO Clindamycin today. - Urine and blood cultures without significant growth  - KVO IVF - Tylenol PRN fever/headache - Tramadol PRN for pain  - Nystatin topical powder under right breast for intertrigo.  # Hypokalemia: Pt noted to have a K of 3.2 on 11/26, Pt given K-Dur 71mq x 1. K today is 3.5. 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 - Cont Hyaluronate gel at bedtime  - Will contact Dr. GLindi Adie(patient's oncologist) today to inform them of her hospitalization. Pt is supposed to receive radiation today at 3:45pm.  FEN/GI: heart diet Prophylaxis: Lovenox  Disposition: Home likely today.  Subjective:  Pt states she has been having  nausea this morning. She also notes some shortness of breath for the last couple of days with cough productive of green sputum starting this morning. She also states that she heard some wheezing last night. She has no other concerns.  Objective: Temp:  [98.4 F (36.9 C)-99.5 F (37.5 C)] 99.4 F (37.4 C) (11/28 0603) Pulse Rate:  [80-91] 91 (11/28 0603) Resp:  [13-18] 14 (11/28 0603) BP: (143-150)/(73-86) 146/83 mmHg (11/28 0603) SpO2:  [92 %-95 %] 94 % (11/28 0603) Physical Exam: General: NAD, sitting comfortably in bed. Integument: mild erythema of anterior and lateral breast improved from demarcated area, warmth and tenderness noted especially along lateral aspect; no ulceration or fluctuance present Cardiovascular: RRR, no murmurs noted; peripheral pulses intact  Respiratory: Normal work of breathing, mild crackles in left lower lung base MSK: moves all extremities spontaneously, no LE edema, no calf tenderness  Laboratory:  Recent Labs Lab 10/08/15 1121 10/08/15 1927 10/09/15 0630  WBC 17.0* 15.1* 11.9*  HGB 12.1 11.2* 11.0*  HCT 36.1 33.8* 34.0*  PLT 299 257 244    Recent Labs Lab 10/08/15 1121 10/08/15 1927 10/09/15 0630 10/10/15 1034  NA 137  --  138 136  K 3.7  --  3.2* 3.8  CL 101  --  106 103  CO2 30  --  27 27  BUN 9  --  5* <5*  CREATININE 0.62 0.72 0.66 0.67  CALCIUM 9.1  --  8.0* 8.6*  PROT 7.8  --   --   --   BILITOT 0.5  --   --   --   ALKPHOS 72  --   --   --  ALT 12*  --   --   --   AST 18  --   --   --   GLUCOSE 110*  --  95 138*    Imaging/Diagnostic Tests: CXR 11/25 IMPRESSION: Chronic lung disease with moderately severe bilateral scarring. No superimposed acute abnormality.  Sela Hua, MD 10/11/2015, 7:14 AM PGY-1, [place] Intern pager: [phone], text pages welcome

## 2015-10-11 NOTE — Discharge Summary (Signed)
[facility] Discharge Summary  Patient name: [name] Medical record number: [id number] Date of birth: [birth year] Age: 54 y.o. Gender: female Date of Admission: 10/08/2015  Date of Discharge: 10/11/15 Admitting Physician: Dickie La, MD  Primary Care Provider: Cordelia Poche, MD Consultants: None  Indication for Hospitalization: Cellulitis of the right breast in the setting of radiation therapy for breast cancer.  Discharge Diagnoses/Problem List:  Right breast cellulitis Right breast cancer s/p lumpectomy, currently undergoing radiation therapy Sarcoidosis  Hypokalemia HTN COPD GOLD III HLD  Disposition: Home with follow-up at radiation oncology  Discharge Condition: Stable, improved  Discharge Exam:  General: NAD, sitting comfortably in bed. Cardiovascular: RRR, no murmurs noted; peripheral pulses intact  Respiratory: Normal work of breathing, mild crackles in left lower lung base MSK: moves all extremities spontaneously, no LE edema, no calf tenderness Skin: mild erythema of anterior and lateral breast improved from demarcated area, warmth and tenderness noted especially along lateral aspect; no ulceration or fluctuance present.  Brief Hospital Course:  [name] is a 54 year old female with a PMH of R breast cancer currently undergoing radiation who presented from [place] with cellulitis of the right breast. On admission, she had a temperature to 102F and endorsed body aches. She was started on Vanc/Zosyn per cellulitis protocol. Blood and urine cultures were drawn and showed no growth. Her cellulitis improved throughout hospitalization. She was transitioned to IV Clindamycin and then PO Clindamycin on the day of discharge. We spoke to her radiation oncologist, who thought it was possible that her cellulitis was actually inflammation secondary to the radiation therapy. She was discharged home with follow-up appointment that afternoon with  the radiation oncologist, who would decide if she should continue radiation or take a break to let the inflammation improved.  [name] was also noted to have hypokalemia to 3.2 during admission. We replaced her K with K-Dur 76mEq x 1. The rest of her chronic medical conditions were stable and no changes were made to her medications.  Issues for Follow Up:  1. Pt should complete a 10 day course of Clindamycin for the cellulitis. Please follow-up to make sure the cellulitis is continuing to improve.  Significant Procedures: None  Significant Labs and Imaging:   Recent Labs Lab 10/08/15 1927 10/09/15 0630 10/11/15 0752  WBC 15.1* 11.9* 8.0  HGB 11.2* 11.0* 11.4*  HCT 33.8* 34.0* 33.5*  PLT 257 244 271    Recent Labs Lab 10/08/15 1121 10/08/15 1927 10/09/15 0630 10/10/15 1034 10/11/15 0752  NA 137  --  138 136 140  K 3.7  --  3.2* 3.8 3.5  CL 101  --  106 103 103  CO2 30  --  27 27 31   GLUCOSE 110*  --  95 138* 101*  BUN 9  --  5* <5* <5*  CREATININE 0.62 0.72 0.66 0.67 0.56  CALCIUM 9.1  --  8.0* 8.6* 9.0  ALKPHOS 72  --   --   --   --   AST 18  --   --   --   --   ALT 12*  --   --   --   --   ALBUMIN 3.8  --   --   --   --    Lactic acid 1.32 > 0.38 UA: negative leukocytes, negative nitrites Blood culture: no growth Urine culture: 4,000 colonies, insignificant growth  CXR (11/25): Chronic lung disease with moderately severe bilateral scarring. No superimposed  acute abnormality.  Results/Tests Pending at Time of Discharge: None  Discharge Medications:    Medication List    TAKE these medications        acetaminophen 500 MG tablet  Commonly known as:  TYLENOL  Take 1,000 mg by mouth every 6 (six) hours as needed for fever.     albuterol 108 (90 BASE) MCG/ACT inhaler  Commonly known as:  PROVENTIL HFA;VENTOLIN HFA  Inhale 2 puffs into the lungs every 4 (four) hours as needed for wheezing or shortness of breath. For shortness of breath or wheezing      aspirin 81 MG EC tablet  Take 1 tablet (81 mg total) by mouth daily.     budesonide-formoterol 160-4.5 MCG/ACT inhaler  Commonly known as:  SYMBICORT  Inhale 2 puffs into the lungs 2 (two) times daily.     clindamycin 300 MG capsule  Commonly known as:  CLEOCIN  Take 1 capsule (300 mg total) by mouth 3 (three) times daily.     fluticasone 50 MCG/ACT nasal spray  Commonly known as:  FLONASE  Place 2 sprays into both nostrils daily as needed for allergies.     hyaluronate sodium Gel  Apply 1 application topically once.     hydrochlorothiazide 12.5 MG tablet  Commonly known as:  HYDRODIURIL  Take 1 tablet (12.5 mg total) by mouth daily.     metoprolol tartrate 25 MG tablet  Commonly known as:  LOPRESSOR  TAKE ONE TABLET BY MOUTH TWICE DAILY     multivitamin capsule  Take 1 capsule by mouth daily.     non-metallic deodorant Misc  Commonly known as:  ALRA  Apply 1 application topically daily as needed.     omeprazole 20 MG capsule  Commonly known as:  PRILOSEC  TAKE ONE CAPSULE BY MOUTH ONCE DAILY. TAKE 30 TO 60 MINUTES BEFORE YOUR FIRST AND LAST MEALS OF THE DAY.     traMADol 50 MG tablet  Commonly known as:  ULTRAM  Take 50 mg by mouth daily as needed.     venlafaxine XR 37.5 MG 24 hr capsule  Commonly known as:  EFFEXOR-XR  Take 1 capsule (37.5 mg total) by mouth daily with breakfast.        Discharge Instructions: Please refer to Patient Instructions section of EMR for full details.  Patient was counseled important signs and symptoms that should prompt return to medical care, changes in medications, dietary instructions, activity restrictions, and follow up appointments.   Follow-Up Appointments:     Follow-up Information    Follow up with Cordelia Poche, MD On 10/18/2015.   Specialty:  Family Medicine   Why:  hospital follow-up visit at 10:15am.   Contact information:   [place] [id number] [phone]       Sela Hua,  MD 10/11/2015, 5:35 PM PGY-1, [place]

## 2015-10-11 NOTE — Discharge Instructions (Signed)
You were hospitalized because we were concerned that you had a skin infection of the right breast. We gave you IV antibiotics and the skin infection improved. We spoke with the radiation oncologist today and she thinks the redness and pain may be caused by inflammation due to the radiation. She would like to see you over at the cancer center 30 minutes before your scheduled appointment so that she can take a look at your breast to determine if you should skip today's radiation or get the treatment. She would like you to check in and go directly to the nursing station. We will discharge you home with Clindamycin (an antibiotic). Please take this three times a day for 7 more days.

## 2015-10-11 NOTE — Progress Notes (Signed)
NURSING PROGRESS NOTE  [name] [id number] Discharge Data: 10/11/2015 1:54 PM Attending Provider: Alveda Reasons, MD [id number] Erin Prude, MD     [name] to be D/C'd Home per MD order.  Discussed with the patient the After Visit Summary and all questions fully answered. All IV's discontinued with no bleeding noted. All belongings returned to patient for patient to take home.   Last Vital Signs:  Blood pressure 136/64, pulse 84, temperature 99.4 F (37.4 C), temperature source Oral, resp. rate 14, height 5\' 5"  (1.651 m), weight 83 kg (182 lb 15.7 oz), SpO2 95 %.  Discharge Medication List   Medication List    TAKE these medications        acetaminophen 500 MG tablet  Commonly known as:  TYLENOL  Take 1,000 mg by mouth every 6 (six) hours as needed for fever.     albuterol 108 (90 BASE) MCG/ACT inhaler  Commonly known as:  PROVENTIL HFA;VENTOLIN HFA  Inhale 2 puffs into the lungs every 4 (four) hours as needed for wheezing or shortness of breath. For shortness of breath or wheezing     aspirin 81 MG EC tablet  Take 1 tablet (81 mg total) by mouth daily.     budesonide-formoterol 160-4.5 MCG/ACT inhaler  Commonly known as:  SYMBICORT  Inhale 2 puffs into the lungs 2 (two) times daily.     clindamycin 300 MG capsule  Commonly known as:  CLEOCIN  Take 1 capsule (300 mg total) by mouth 3 (three) times daily.     fluticasone 50 MCG/ACT nasal spray  Commonly known as:  FLONASE  Place 2 sprays into both nostrils daily as needed for allergies.     hyaluronate sodium Gel  Apply 1 application topically once.     hydrochlorothiazide 12.5 MG tablet  Commonly known as:  HYDRODIURIL  Take 1 tablet (12.5 mg total) by mouth daily.     metoprolol tartrate 25 MG tablet  Commonly known as:  LOPRESSOR  TAKE ONE TABLET BY MOUTH TWICE DAILY     multivitamin capsule  Take 1 capsule by mouth daily.     non-metallic deodorant Misc  Commonly known as:  ALRA  Apply 1  application topically daily as needed.     omeprazole 20 MG capsule  Commonly known as:  PRILOSEC  TAKE ONE CAPSULE BY MOUTH ONCE DAILY. TAKE 30 TO 60 MINUTES BEFORE YOUR FIRST AND LAST MEALS OF THE DAY.     traMADol 50 MG tablet  Commonly known as:  ULTRAM  Take 50 mg by mouth daily as needed.     venlafaxine XR 37.5 MG 24 hr capsule  Commonly known as:  EFFEXOR-XR  Take 1 capsule (37.5 mg total) by mouth daily with breakfast.         Charolette Child, RN

## 2015-10-12 ENCOUNTER — Ambulatory Visit
Admission: RE | Admit: 2015-10-12 | Discharge: 2015-10-12 | Disposition: A | Discharge status: Home / Self Care | Payer: BLUE CROSS/BLUE SHIELD | Source: Ambulatory Visit | Attending: Radiation Oncology | Admitting: Radiation Oncology

## 2015-10-12 DIAGNOSIS — D509 Iron deficiency anemia, unspecified: Secondary | ICD-10-CM | POA: Diagnosis not present

## 2015-10-12 DIAGNOSIS — Z8249 Family history of ischemic heart disease and other diseases of the circulatory system: Secondary | ICD-10-CM | POA: Diagnosis not present

## 2015-10-12 DIAGNOSIS — I10 Essential (primary) hypertension: Secondary | ICD-10-CM | POA: Diagnosis not present

## 2015-10-12 DIAGNOSIS — Z170 Estrogen receptor positive status [ER+]: Secondary | ICD-10-CM | POA: Diagnosis not present

## 2015-10-12 DIAGNOSIS — D869 Sarcoidosis, unspecified: Secondary | ICD-10-CM | POA: Diagnosis not present

## 2015-10-12 DIAGNOSIS — Z803 Family history of malignant neoplasm of breast: Secondary | ICD-10-CM | POA: Diagnosis not present

## 2015-10-12 DIAGNOSIS — Z7951 Long term (current) use of inhaled steroids: Secondary | ICD-10-CM | POA: Diagnosis not present

## 2015-10-12 DIAGNOSIS — Z510 Encounter for antineoplastic radiation therapy: Secondary | ICD-10-CM | POA: Diagnosis not present

## 2015-10-12 DIAGNOSIS — J449 Chronic obstructive pulmonary disease, unspecified: Secondary | ICD-10-CM | POA: Diagnosis not present

## 2015-10-12 DIAGNOSIS — C50411 Malignant neoplasm of upper-outer quadrant of right female breast: Secondary | ICD-10-CM | POA: Diagnosis present

## 2015-10-12 DIAGNOSIS — D573 Sickle-cell trait: Secondary | ICD-10-CM | POA: Diagnosis not present

## 2015-10-12 NOTE — Progress Notes (Signed)
Ms. [name] came to nursing after her treatment today to have her temperature checked. She was 99.2, and stated the last time she took Tylenol was this morning. She asked if Dr. Marlowe Aschoff office had been contacted to see her about her swelling in her Right Breast, and I told her Dr. Isidore Moos had contacted them yesterday and they should be contacting her soon. I told her that if the Tylenol was not able to keep her temperature down, to come to nursing again tomorrow to see a MD. She voiced her understanding of what to do.

## 2015-10-13 ENCOUNTER — Ambulatory Visit
Admission: RE | Admit: 2015-10-13 | Discharge: 2015-10-13 | Disposition: A | Discharge status: Home / Self Care | Payer: BLUE CROSS/BLUE SHIELD | Source: Ambulatory Visit | Attending: Radiation Oncology | Admitting: Radiation Oncology

## 2015-10-13 DIAGNOSIS — Z510 Encounter for antineoplastic radiation therapy: Secondary | ICD-10-CM | POA: Diagnosis not present

## 2015-10-13 LAB — CULTURE, BLOOD (ROUTINE X 2)
CULTURE: NO GROWTH
CULTURE: NO GROWTH

## 2015-10-14 ENCOUNTER — Encounter: Payer: Self-pay | Admitting: Radiation Oncology

## 2015-10-14 ENCOUNTER — Ambulatory Visit
Admission: RE | Admit: 2015-10-14 | Discharge: 2015-10-14 | Disposition: A | Discharge status: Home / Self Care | Payer: BLUE CROSS/BLUE SHIELD | Source: Ambulatory Visit | Attending: Radiation Oncology | Admitting: Radiation Oncology

## 2015-10-14 DIAGNOSIS — Z510 Encounter for antineoplastic radiation therapy: Secondary | ICD-10-CM | POA: Diagnosis not present

## 2015-10-15 ENCOUNTER — Ambulatory Visit
Admission: RE | Admit: 2015-10-15 | Discharge: 2015-10-15 | Disposition: A | Discharge status: Home / Self Care | Payer: BLUE CROSS/BLUE SHIELD | Source: Ambulatory Visit | Attending: Radiation Oncology | Admitting: Radiation Oncology

## 2015-10-15 DIAGNOSIS — Z510 Encounter for antineoplastic radiation therapy: Secondary | ICD-10-CM | POA: Diagnosis not present

## 2015-10-18 ENCOUNTER — Ambulatory Visit
Admission: RE | Admit: 2015-10-18 | Discharge: 2015-10-18 | Disposition: A | Discharge status: Home / Self Care | Payer: BLUE CROSS/BLUE SHIELD | Source: Ambulatory Visit | Attending: Radiation Oncology | Admitting: Radiation Oncology

## 2015-10-18 ENCOUNTER — Ambulatory Visit (INDEPENDENT_AMBULATORY_CARE_PROVIDER_SITE_OTHER): Payer: BLUE CROSS/BLUE SHIELD | Admitting: Family Medicine

## 2015-10-18 ENCOUNTER — Encounter: Payer: Self-pay | Admitting: Radiation Oncology

## 2015-10-18 VITALS — BP 117/71 | HR 95 | Temp 98.6°F | Ht 65.0 in | Wt 173.5 lb

## 2015-10-18 VITALS — BP 116/70 | HR 83 | Temp 98.1°F | Ht 65.0 in | Wt 170.6 lb

## 2015-10-18 DIAGNOSIS — N61 Mastitis without abscess: Secondary | ICD-10-CM | POA: Diagnosis not present

## 2015-10-18 DIAGNOSIS — Z510 Encounter for antineoplastic radiation therapy: Secondary | ICD-10-CM | POA: Diagnosis not present

## 2015-10-18 DIAGNOSIS — C50411 Malignant neoplasm of upper-outer quadrant of right female breast: Secondary | ICD-10-CM

## 2015-10-18 MED ORDER — FLUCONAZOLE 100 MG PO TABS: ORAL_TABLET | ORAL | Status: DC

## 2015-10-18 MED ORDER — HYDROCHLOROTHIAZIDE 12.5 MG PO TABS: 12.5000 mg | ORAL_TABLET | Freq: Every day | ORAL | Status: DC

## 2015-10-18 NOTE — Patient Instructions (Signed)
Thank you for coming to see me today. It was a pleasure. Today we talked about:   Breast infection/hospital stay: I am glad you are doing better. I will follow-up with you in 3 months  If you have any questions or concerns, please do not hesitate to call the office at [phone].  Sincerely,  Cordelia Poche, MD

## 2015-10-18 NOTE — Progress Notes (Signed)
Ms. Miano presents for her 18th fraction of radiation to her Right Breast. She admits to mild fatigue all the time. She states she has not been eating as well as she should, and relates her fatigue to this. She is trying to eat better.  She has one more day of clindamycin for cellulitis in her Right Breast and she is feeling much better. She states she had fluid removed 12/2 between her two incisions, but reports today "it feels as if the fluid has returned". Her Right breast is red, tender, and hyperpigmented. She has peeling underneath her breast that started this past Thursday or Friday which she put peroxide and neosporin on today. I told her to use neosporin, not peroxide. She has no other complaints at this time.   BP 117/71 mmHg  Pulse 95  Temp(Src) 98.6 F (37 C)  Ht 5\' 5"  (1.651 m)  Wt 173 lb 8 oz (78.699 kg)  BMI 28.87 kg/m2   Wt Readings from Last 3 Encounters:  10/18/15 173 lb 8 oz (78.699 kg)  10/18/15 170 lb 9.6 oz (77.384 kg)  10/08/15 182 lb 15.7 oz (83 kg)

## 2015-10-18 NOTE — Progress Notes (Signed)
    Subjective   [name] is a 54 y.o. female that presents for a same day visit  1. Hospital follow-up for cellulitis: Patient was recently admitted to the hospital for a right breast cellulitis. She has been using clindamycin for her infection. She followed up with her surgeon and had about 90cc of purulent fluid drained from a breast abscess. She reports her last day is tomorrow. She has been receiving radiation therapy for her right breast as adjuvant therapy. She reports her skin feeling raw underneath her breast. She has been using a cream that she was given which has not helped much. She was told she was supposed to keep her skin dry. She reports no fevers but has some nausea at times.   ROS Per HPI  Social History  Substance Use Topics  . Smoking status: Former Smoker -- 1.00 packs/day for 20 years    Quit date: 11/14/2003  . Smokeless tobacco: Never Used  . Alcohol Use: No    Allergies  Allergen Reactions  . Codeine Other (See Comments)    Avoids-felt bad when took before    Objective   BP 116/70 mmHg  Pulse 83  Temp(Src) 98.1 F (36.7 C) (Oral)  Ht 5\' 5"  (1.651 m)  Wt 170 lb 9.6 oz (77.384 kg)  BMI 28.39 kg/m2  General: Well appearing, no distress  Assessment and Plan   Meds ordered this encounter  Medications  . hydrochlorothiazide (HYDRODIURIL) 12.5 MG tablet    Sig: Take 1 tablet (12.5 mg total) by mouth daily.    Dispense:  90 tablet    Refill:  3    Cellulitis: patient is following-up with radiology oncologist for follow-up of this skin irritation. Patient is also following up with surgery regarding possible re-drain of breast abscess. Patient has been otherwise stable. I have nothing to add to this problem at this time, but offered assistance if ever needed

## 2015-10-18 NOTE — Progress Notes (Signed)
   Weekly Management Note:  Outpatient    ICD-9-CM ICD-10-CM   1. Breast cancer of upper-outer quadrant of right female breast ([facility]) 174.4 C50.411 fluconazole (DIFLUCAN) 100 MG tablet    Current Dose:  46.05 Gy  Projected Dose: 50.05 Gy   Narrative:  The patient presents for routine under treatment assessment.  CBCT/MVCT images/Port film x-rays were reviewed.  The chart was checked. Feeling better. Seroma was drained last week by Dr Barry Dienes. Afebrile. finishing ABX  Physical Findings:  height is 5\' 5"  (1.651 m) and weight is 173 lb 8 oz (78.699 kg). Her temperature is 98.6 F (37 C). Her blood pressure is 117/71 and her pulse is 95.   Wt Readings from Last 3 Encounters:  10/18/15 173 lb 8 oz (78.699 kg)  10/18/15 170 lb 9.6 oz (77.384 kg)  10/08/15 182 lb 15.7 oz (83 kg)   Hyperpigmented over right breast.  Seroma.  No excessive warmth or erythema or sign of infection.  Small area of desquamation at IM fold   Impression:  The patient is tolerating radiotherapy.  Plan:  Continue radiotherapy as planned. telfa pads for IM fold.  Neosporin at peeling area.  F/u in 38mo. Sooner, if needed.  ________________________________   Eppie Gibson, M.D.

## 2015-10-19 ENCOUNTER — Ambulatory Visit
Admission: RE | Admit: 2015-10-19 | Discharge: 2015-10-19 | Disposition: A | Discharge status: Home / Self Care | Payer: BLUE CROSS/BLUE SHIELD | Source: Ambulatory Visit | Attending: Radiation Oncology | Admitting: Radiation Oncology

## 2015-10-19 DIAGNOSIS — Z510 Encounter for antineoplastic radiation therapy: Secondary | ICD-10-CM | POA: Diagnosis not present

## 2015-10-20 ENCOUNTER — Ambulatory Visit
Admission: RE | Admit: 2015-10-20 | Discharge: 2015-10-20 | Disposition: A | Discharge status: Home / Self Care | Payer: BLUE CROSS/BLUE SHIELD | Source: Ambulatory Visit | Attending: Radiation Oncology | Admitting: Radiation Oncology

## 2015-10-20 ENCOUNTER — Ambulatory Visit: Payer: BLUE CROSS/BLUE SHIELD

## 2015-10-20 ENCOUNTER — Encounter: Payer: Self-pay | Admitting: Radiation Oncology

## 2015-10-20 DIAGNOSIS — Z510 Encounter for antineoplastic radiation therapy: Secondary | ICD-10-CM | POA: Diagnosis not present

## 2015-10-21 ENCOUNTER — Ambulatory Visit: Payer: BLUE CROSS/BLUE SHIELD

## 2015-10-22 ENCOUNTER — Ambulatory Visit: Payer: BLUE CROSS/BLUE SHIELD

## 2015-10-25 ENCOUNTER — Ambulatory Visit: Payer: BLUE CROSS/BLUE SHIELD | Admitting: Hematology and Oncology

## 2015-10-25 ENCOUNTER — Ambulatory Visit: Payer: BLUE CROSS/BLUE SHIELD

## 2015-10-26 ENCOUNTER — Ambulatory Visit: Payer: BLUE CROSS/BLUE SHIELD

## 2015-10-27 ENCOUNTER — Ambulatory Visit: Payer: BLUE CROSS/BLUE SHIELD

## 2015-10-28 ENCOUNTER — Ambulatory Visit: Payer: BLUE CROSS/BLUE SHIELD

## 2015-10-28 ENCOUNTER — Other Ambulatory Visit: Payer: Self-pay | Admitting: Adult Health

## 2015-10-28 DIAGNOSIS — C50411 Malignant neoplasm of upper-outer quadrant of right female breast: Secondary | ICD-10-CM

## 2015-10-29 ENCOUNTER — Telehealth: Payer: Self-pay | Admitting: Hematology and Oncology

## 2015-10-29 ENCOUNTER — Ambulatory Visit: Payer: BLUE CROSS/BLUE SHIELD

## 2015-10-29 NOTE — Telephone Encounter (Signed)
Appointments made and patient aware °

## 2015-11-01 ENCOUNTER — Telehealth: Payer: Self-pay | Admitting: Family Medicine

## 2015-11-01 ENCOUNTER — Ambulatory Visit: Payer: BLUE CROSS/BLUE SHIELD

## 2015-11-01 ENCOUNTER — Telehealth: Payer: Self-pay | Admitting: Internal Medicine

## 2015-11-01 NOTE — Telephone Encounter (Signed)
Needs refill on prilosec.

## 2015-11-01 NOTE — Addendum Note (Signed)
Encounter addended by: Eppie Gibson, MD on: 11/01/2015 11:08 AM<BR>     Documentation filed: Notes Section

## 2015-11-01 NOTE — Progress Notes (Signed)
Photon Boost Complex Simulation and Treatment Planning Note 10-01-15    ICD-9-CM ICD-10-CM   1. Breast cancer of upper-outer quadrant of right female breast ([place]) 174.4 C50.411     Diagnosis: see above  The patient's CT images from her free-breathing simulation were reviewed to plan her boost treatment to her right breast  lumpectomy cavity.  The boost to the lumpectomy cavity will be delivered with 2 photon fields using MLCs for custom blocks again heart and lungs, with 6 and 10 MV photon energy.  This constitutes 2 complex treatment devices. Isodose plan was reviewed and approved. 10 Gy in 5 fractions prescribed.  -----------------------------------  Eppie Gibson, MD

## 2015-11-01 NOTE — Progress Notes (Signed)
  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   She did develop cellulitis during treatment which was likely related to an infected seroma cavity.  Patient was referred back to surgery.  She did well after aspiration of the seroma and taking antibiotics.  Did not experience an excessive break from treatment.  Plan: The patient has completed radiation treatment. The patient will return to radiation oncology clinic for routine followup in one month. I advised them to call or return sooner if they have any questions or concerns related to their recovery or treatment.  -----------------------------------  Eppie Gibson, MD

## 2015-11-01 NOTE — Telephone Encounter (Signed)
OPENED IN ERROR.[name]

## 2015-11-01 NOTE — Progress Notes (Signed)
Simulation Verification Note    10-14-15 outpatient   ICD-9-CM ICD-10-CM   1. Breast cancer of upper-outer quadrant of right female breast ([place]) 174.4 C50.411     The patient was brought to the treatment unit and placed in the planned treatment position. The clinical setup was verified. Then port films were obtained and uploaded to the radiation oncology medical record software.  The treatment beams were carefully compared against the planned radiation fields. The position location and shape of the radiation fields was reviewed. They targeted volume of tissue appears to be appropriately covered by the radiation beams. Organs at risk appear to be excluded as planned.  Based on my personal review, I approved the simulation verification. The patient's treatment will proceed as planned.  -----------------------------------  Eppie Gibson, MD

## 2015-11-02 ENCOUNTER — Ambulatory Visit: Payer: BLUE CROSS/BLUE SHIELD

## 2015-11-02 ENCOUNTER — Telehealth: Payer: Self-pay | Admitting: *Deleted

## 2015-11-02 NOTE — Telephone Encounter (Signed)
Spoke with patient to follow up post radiation.  She is doing good.  Encouraged her to call with any needs or concerns.

## 2015-11-03 ENCOUNTER — Ambulatory Visit: Payer: BLUE CROSS/BLUE SHIELD

## 2015-11-04 MED ORDER — OMEPRAZOLE 20 MG PO CPDR: 20.0000 mg | DELAYED_RELEASE_CAPSULE | Freq: Every day | ORAL | Status: DC

## 2015-11-04 NOTE — Telephone Encounter (Signed)
Omeprazole refill sent to pharmacy 

## 2015-11-22 ENCOUNTER — Encounter: Payer: Self-pay | Admitting: Hematology and Oncology

## 2015-11-22 ENCOUNTER — Telehealth: Payer: Self-pay | Admitting: Hematology and Oncology

## 2015-11-22 ENCOUNTER — Ambulatory Visit (HOSPITAL_BASED_OUTPATIENT_CLINIC_OR_DEPARTMENT_OTHER): Payer: BLUE CROSS/BLUE SHIELD | Admitting: Hematology and Oncology

## 2015-11-22 VITALS — BP 164/78 | HR 82 | Temp 98.6°F | Resp 18 | Wt 177.2 lb

## 2015-11-22 DIAGNOSIS — C50411 Malignant neoplasm of upper-outer quadrant of right female breast: Secondary | ICD-10-CM | POA: Diagnosis not present

## 2015-11-22 DIAGNOSIS — N951 Menopausal and female climacteric states: Secondary | ICD-10-CM | POA: Diagnosis not present

## 2015-11-22 MED ORDER — GABAPENTIN 100 MG PO CAPS: 100.0000 mg | ORAL_CAPSULE | Freq: Every day | ORAL | Status: DC

## 2015-11-22 MED ORDER — ANASTROZOLE 1 MG PO TABS: 1.0000 mg | ORAL_TABLET | Freq: Every day | ORAL | Status: DC

## 2015-11-22 NOTE — Telephone Encounter (Signed)
Appointments made and avs printed for patient °

## 2015-11-22 NOTE — Addendum Note (Signed)
Addended by: Prentiss Bells on: 11/22/2015 05:16 PM   Modules accepted: Medications

## 2015-11-22 NOTE — Progress Notes (Signed)
Patient Care Team: Mariel Aloe, MD as PCP - General (Family Medicine)  DIAGNOSIS: No matching staging information was found for the patient.  SUMMARY OF ONCOLOGIC HISTORY:   Breast cancer of upper-outer quadrant of right female breast ([place])   07/08/2015 Mammogram Right breast calcifications 11 mm span   08/04/2015 Initial Diagnosis Afirm biopsy right breast: Invasive ductal carcinoma with DCIS with calcifications, lymphovascular invasion is identified, grade 2-3, ER 100%, PR 100% Ki-67 20%, HER-2 negative ratio 1.46D: T1b N0 stage I a clinical stage   08/18/2015 Surgery Right  lumpectomy : invasive ductal carcinoma, grade 2, 0.6 cm, with DCIS high-grade, margins negative, 0/2 lymph nodes negative, ER 100%, PR positive, HER-2 negative, Ki-67 20%, T1 BN 0 stage IA   09/21/2015 - 10/20/2015 Radiation Therapy Adjuvant radiation therapy   10/08/2015 - 10/11/2015 Hospital Admission cellulitis of the right breast    CHIEF COMPLIANT: complains of severe hot flashes  INTERVAL HISTORY: [name] is a 55 year old American lady with above-mentioned history of right breast cancer treated with lumpectomy and radiation. She has had profound hot flashes that were miserable. We stopped her Effexor. It appears that her symptoms have gotten better through radiation but once her radiation was discontinued, hot flashes returned with a vengeance. She thinks that Effexor is not working at all.  REVIEW OF SYSTEMS:   Constitutional: Denies fevers, chills or abnormal weight loss Eyes: Denies blurriness of vision Ears, nose, mouth, throat, and face: Denies mucositis or sore throat Respiratory: Denies cough, dyspnea or wheezes Cardiovascular: Denies palpitation, chest discomfort Gastrointestinal:  Denies nausea, heartburn or change in bowel habits Skin: Denies abnormal skin rashes Lymphatics: Denies new lymphadenopathy or easy bruising Neurological:Denies numbness, tingling or new  weaknesses Behavioral/Psych: Mood is stable, no new changes  Extremities: No lower extremity edema Breast:  denies any pain or lumps or nodules in either breasts All other systems were reviewed with the patient and are negative.  I have reviewed the past medical history, past surgical history, social history and family history with the patient and they are unchanged from previous note.  ALLERGIES:  is allergic to codeine.  MEDICATIONS:  Current Outpatient Prescriptions  Medication Sig Dispense Refill  . acetaminophen (TYLENOL) 500 MG tablet Take 1,000 mg by mouth every 6 (six) hours as needed for fever.    Marland Kitchen albuterol (PROVENTIL HFA;VENTOLIN HFA) 108 (90 BASE) MCG/ACT inhaler Inhale 2 puffs into the lungs every 4 (four) hours as needed for wheezing or shortness of breath. For shortness of breath or wheezing 1 Inhaler 1  . aspirin EC 81 MG EC tablet Take 1 tablet (81 mg total) by mouth daily. 30 tablet 11  . budesonide-formoterol (SYMBICORT) 160-4.5 MCG/ACT inhaler Inhale 2 puffs into the lungs 2 (two) times daily. 1 Inhaler 4  . clindamycin (CLEOCIN) 300 MG capsule Take 1 capsule (300 mg total) by mouth 3 (three) times daily. 22 capsule 0  . fluconazole (DIFLUCAN) 100 MG tablet Take 2 tablets today, then 1 tablet daily for 6 more days. 8 tablet 0  . fluticasone (FLONASE) 50 MCG/ACT nasal spray Place 2 sprays into both nostrils daily as needed for allergies. 16 g 11  . hyaluronate sodium (RADIAPLEXRX) GEL Apply 1 application topically once.    . hydrochlorothiazide (HYDRODIURIL) 12.5 MG tablet Take 1 tablet (12.5 mg total) by mouth daily. 90 tablet 3  . metoprolol tartrate (LOPRESSOR) 25 MG tablet TAKE ONE TABLET BY MOUTH TWICE DAILY 60 tablet 5  . Multiple Vitamin (MULTIVITAMIN) capsule Take  1 capsule by mouth daily.    . non-metallic deodorant Jethro Poling) MISC Apply 1 application topically daily as needed.    Marland Kitchen omeprazole (PRILOSEC) 20 MG capsule Take 1 capsule (20 mg total) by mouth daily. 30  capsule 0  . traMADol (ULTRAM) 50 MG tablet Take 50 mg by mouth daily as needed.     . venlafaxine XR (EFFEXOR-XR) 37.5 MG 24 hr capsule Take 1 capsule (37.5 mg total) by mouth daily with breakfast. 30 capsule 11   No current facility-administered medications for this visit.    PHYSICAL EXAMINATION: ECOG PERFORMANCE STATUS: 1 - Symptomatic but completely ambulatory  Filed Vitals:   11/22/15 1356  BP: 164/78  Pulse: 82  Temp: 98.6 F (37 C)  Resp: 18   Filed Weights   11/22/15 1356  Weight: 177 lb 3.2 oz (80.377 kg)    GENERAL:alert, no distress and comfortable SKIN: skin color, texture, turgor are normal, no rashes or significant lesions EYES: normal, Conjunctiva are pink and non-injected, sclera clear OROPHARYNX:no exudate, no erythema and lips, buccal mucosa, and tongue normal  NECK: supple, thyroid normal size, non-tender, without nodularity LYMPH:  no palpable lymphadenopathy in the cervical, axillary or inguinal LUNGS: clear to auscultation and percussion with normal breathing effort HEART: regular rate & rhythm and no murmurs and no lower extremity edema ABDOMEN:abdomen soft, non-tender and normal bowel sounds MUSCULOSKELETAL:no cyanosis of digits and no clubbing  NEURO: alert & oriented x 3 with fluent speech, no focal motor/sensory deficits EXTREMITIES: No lower extremity edema   LABORATORY DATA:  I have reviewed the data as listed   Chemistry      Component Value Date/Time   NA 140 10/11/2015 0752   K 3.5 10/11/2015 0752   CL 103 10/11/2015 0752   CO2 31 10/11/2015 0752   BUN <5* 10/11/2015 0752   CREATININE 0.56 10/11/2015 0752   CREATININE 0.64 02/04/2015 1619      Component Value Date/Time   CALCIUM 9.0 10/11/2015 0752   ALKPHOS 72 10/08/2015 1121   AST 18 10/08/2015 1121   ALT 12* 10/08/2015 1121   BILITOT 0.5 10/08/2015 1121       Lab Results  Component Value Date   WBC 8.0 10/11/2015   HGB 11.4* 10/11/2015   HCT 33.5* 10/11/2015   MCV  79.2 10/11/2015   PLT 271 10/11/2015   NEUTROABS 15.4* 10/08/2015   ASSESSMENT & PLAN:  Breast cancer of upper-outer quadrant of right female breast ([place]) Right breast invasive ductal carcinoma status post lumpectomy on 08/18/2015, grade 2, 0.6 cm, with DCIS high-grade, margins negative, 0/2 lymph nodes negative, ER 100%, PR positive, HER-2 negative, Ki-67 20%, T1bN0 stage IA,  Adjuvant radiation from 09/21/2015 to 10/20/2015  Recommendation: Adjuvant antiestrogen therapy with anastrozole 1 mg daily 5 years Severe hot flashes: Effexor XR not working. She has hot flashes almost every hour with drenching night sweats. She reported that for a few days to radiation her hot flashes have gotten better but once her radiation stopped they got significantly worse. I recommended discontinuing Effexor and starting her on Neurontin 100 mg at bedtime. I'm very concerned that once she stops antiestrogen therapy, the hot flashes could get markedly worse. So recommended that she start anastrozole to 3 weeks after her hot flashes got better on Neurontin.  Return to clinic in 3 months for toxicity check and follow up.  No orders of the defined types were placed in this encounter.   The patient has a good understanding of the overall  plan. she agrees with it. she will call with any problems that may develop before the next visit here.   Rulon Eisenmenger, MD 11/22/2015

## 2015-11-22 NOTE — Assessment & Plan Note (Signed)
Right breast invasive ductal carcinoma status post lumpectomy on 08/18/2015, grade 2, 0.6 cm, with DCIS high-grade, margins negative, 0/2 lymph nodes negative, ER 100%, PR positive, HER-2 negative, Ki-67 20%, T1bN0 stage IA,  Adjuvant radiation from 09/21/2015 to 10/20/2015  Recommendation: Adjuvant antiestrogen therapy with anastrozole 1 mg daily 5 years Severe hot flashes: Currently on Effexor XR  Return to clinic in 3 months for toxicity check and follow up.

## 2015-12-03 ENCOUNTER — Encounter: Payer: Self-pay | Admitting: Radiation Oncology

## 2015-12-03 ENCOUNTER — Ambulatory Visit
Admission: RE | Admit: 2015-12-03 | Discharge: 2015-12-03 | Disposition: A | Discharge status: Home / Self Care | Payer: BLUE CROSS/BLUE SHIELD | Source: Ambulatory Visit | Attending: Radiation Oncology | Admitting: Radiation Oncology

## 2015-12-03 VITALS — BP 120/77 | HR 92 | Temp 98.2°F | Ht 65.0 in | Wt 177.9 lb

## 2015-12-03 DIAGNOSIS — C50411 Malignant neoplasm of upper-outer quadrant of right female breast: Secondary | ICD-10-CM

## 2015-12-03 NOTE — Progress Notes (Addendum)
Radiation Oncology         [phone] ________________________________  Name: [name] MRN: [id number]  Date: 12/03/2015  DOB: 21-Feb-1961  Follow-Up Visit Note  Outpatient  CC: Cordelia Poche, MD  Nicholas Lose, MD  Diagnosis and Prior Radiotherapy:    ICD-9-CM ICD-10-CM   1. 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  Completed radiation on 09/21/2015-10/20/2015 to the right breast with 40.05 Gy in 15 fractions plus boost to the right breast with 10 Gy in 5 fractions.  Narrative:  The patient returns today for routine follow-up. She reports at times she feels short of breath, and relates this to gaining some weight recently and to her sarcoidosis. Her oxygen level is 97% on room air. She also states she feels dizzy at times during the day, but states it has improved recently. She denies any fatigue, but states she is sleepy today related to the rainy weather. The skin over her right breast is hyperpigmented. She has a fluid filled area above her incision, that has been drained 3 total times the last time being about 3 weeks to a month ago. She denies fever or cough. No calf tenderness.  ALLERGIES:  is allergic to codeine.  Meds: Current Outpatient Prescriptions  Medication Sig Dispense Refill  . acetaminophen (TYLENOL) 500 MG tablet Take 1,000 mg by mouth every 6 (six) hours as needed for fever.    Marland Kitchen albuterol (PROVENTIL HFA;VENTOLIN HFA) 108 (90 BASE) MCG/ACT inhaler Inhale 2 puffs into the lungs every 4 (four) hours as needed for wheezing or shortness of breath. For shortness of breath or wheezing 1 Inhaler 1  . aspirin EC 81 MG EC tablet Take 1 tablet (81 mg total) by mouth daily. 30 tablet 11  . budesonide-formoterol (SYMBICORT) 160-4.5 MCG/ACT inhaler Inhale 2 puffs into the lungs 2  (two) times daily. 1 Inhaler 4  . fluticasone (FLONASE) 50 MCG/ACT nasal spray Place 2 sprays into both nostrils daily as needed for allergies. 16 g 11  . gabapentin (NEURONTIN) 100 MG capsule Take 1 capsule (100 mg total) by mouth at bedtime. 30 capsule 3  . hydrochlorothiazide (HYDRODIURIL) 12.5 MG tablet Take 1 tablet (12.5 mg total) by mouth daily. 90 tablet 3  . metoprolol tartrate (LOPRESSOR) 25 MG tablet TAKE ONE TABLET BY MOUTH TWICE DAILY 60 tablet 5  . Multiple Vitamin (MULTIVITAMIN) capsule Take 1 capsule by mouth daily.    Marland Kitchen omeprazole (PRILOSEC) 20 MG capsule Take 1 capsule (20 mg total) by mouth daily. 30 capsule 0  . traMADol (ULTRAM) 50 MG tablet Take 50 mg by mouth daily as needed.     Marland Kitchen anastrozole (ARIMIDEX) 1 MG tablet Take 1 tablet (1 mg total) by mouth daily. (Patient not taking: Reported on 12/03/2015) 90 tablet 3   No current facility-administered medications for this encounter.    Physical Findings: The patient is in no acute distress. Patient is alert and oriented.  height is 5' 5"  (1.651 m) and weight is 177 lb 14.4 oz (80.695 kg). Her temperature is 98.2 F (36.8 C). Her blood pressure is 120/77 and her pulse is 92. .  Moderate seroma above the right upper outer quadrant by scar. Over the right breast demonstrates residual hyperpigmentation that has healed really well. Skin is intact.   Lab Findings: Lab Results  Component Value Date   WBC 8.0 10/11/2015   HGB 11.4* 10/11/2015   HCT 33.5* 10/11/2015   MCV 79.2 10/11/2015   PLT 271 10/11/2015    Radiographic Findings: No results found.  Impression/Plan: She has an appointment with Dr. Barry Dienes on 12/20/2015 to have the fluid filled area above her incision checked again. Plans to start Anastrazole in about a week and a half. I will follow up with her on an as needed basis. Continue yearly mammography and follow with med/onc. She knows she can call me if she has any issues or questions. I gave her some pamphlets  about the [facility] program and [facility].   Follow up with PCP re: SOB if continues for next week.  _____________________________________   Eppie Gibson, MD    This document serves as a record of services personally performed by Eppie Gibson, MD. It was created on her behalf by Lendon Collar, a trained medical scribe. The creation of this record is based on the scribe's personal observations and the provider's statements to them. This document has been checked and approved by the attending provider.

## 2015-12-03 NOTE — Progress Notes (Addendum)
[name] is here for follow up of radiation completed 10/20/15 to her Right Breast. She reports at times she feels short of breath, and relates this to gaining some weight recently. Her oxygen level is 97% on room air. She also states she feels d dizzy at times during the day, but states it has improved recently. She denies any fatigue, but states she is sleepy today related to the rainy weather. The skin over her Right breast is hyperpigmented. She has a fluid filled area above her incision, that has been drained 3 total times the last time being about 3 weeks to a month ago. She has an appointment with Dr. Barry Dienes on 12/20/15 to possibly have it drained again.   BP 120/77 mmHg  Pulse 92  Temp(Src) 98.2 F (36.8 C)  Ht 5\' 5"  (1.651 m)  Wt 177 lb 14.4 oz (80.695 kg)  BMI 29.60 kg/m2   Wt Readings from Last 3 Encounters:  12/03/15 177 lb 14.4 oz (80.695 kg)  11/22/15 177 lb 3.2 oz (80.377 kg)  10/18/15 173 lb 8 oz (78.699 kg)

## 2015-12-07 ENCOUNTER — Telehealth: Payer: Self-pay | Admitting: *Deleted

## 2015-12-07 ENCOUNTER — Telehealth: Payer: Self-pay | Admitting: Internal Medicine

## 2015-12-07 NOTE — Telephone Encounter (Signed)
Spoke with pt. There is no urgency needed for this message. Pt is not in distress. States that her oncologist started her on a new medication. Since starting this medication, pt has been experiencing SOB. Advised the pt that she needed to contact their office. Stated that she had but wanted to make an appointment with MW in case they told her that this certain medication should not cause SOB. OV has been made for 12/13/15. Again, pt was not in distress while I was speaking to her on the phone. Nothing further was needed.

## 2015-12-07 NOTE — Telephone Encounter (Signed)
1317 Voicemail: "I started a new medication, Gabapentin and have been short of breath for two weeks since I have been on this medication.  I'd lie to talk to a nurse."  No respiratory distress audible with this call retrieved at 1329 and forwarded at this time to [phone].

## 2015-12-13 ENCOUNTER — Ambulatory Visit: Payer: BLUE CROSS/BLUE SHIELD | Admitting: Internal Medicine

## 2015-12-14 ENCOUNTER — Encounter: Payer: Self-pay | Admitting: Gastroenterology

## 2015-12-15 NOTE — Telephone Encounter (Signed)
Per [facility] a Pulmonolgist appointment was scheduled for 12-13-2015.

## 2015-12-20 ENCOUNTER — Other Ambulatory Visit: Payer: Self-pay | Admitting: Family Medicine

## 2015-12-29 ENCOUNTER — Ambulatory Visit (INDEPENDENT_AMBULATORY_CARE_PROVIDER_SITE_OTHER): Payer: BLUE CROSS/BLUE SHIELD | Admitting: Family Medicine

## 2015-12-29 DIAGNOSIS — N3001 Acute cystitis with hematuria: Secondary | ICD-10-CM

## 2015-12-29 DIAGNOSIS — R1012 Left upper quadrant pain: Secondary | ICD-10-CM | POA: Diagnosis not present

## 2015-12-29 DIAGNOSIS — R109 Unspecified abdominal pain: Secondary | ICD-10-CM

## 2015-12-29 LAB — POCT URINALYSIS DIPSTICK
BILIRUBIN UA: NEGATIVE
Glucose, UA: NEGATIVE
KETONES UA: NEGATIVE
Nitrite, UA: NEGATIVE
PH UA: 7
Protein, UA: NEGATIVE
Spec Grav, UA: 1.01
Urobilinogen, UA: 0.2

## 2015-12-29 LAB — POCT UA - MICROSCOPIC ONLY

## 2015-12-29 MED ORDER — CEPHALEXIN 500 MG PO CAPS: 500.0000 mg | ORAL_CAPSULE | Freq: Three times a day (TID) | ORAL | Status: DC

## 2015-12-29 NOTE — Patient Instructions (Signed)
Thank you for coming in to clinic today.  1. You most likely have a Urinary Tract Infection - Keep drinking plenty of water to hydrate - It is gradually improving - Recommend starting Antibiotics - Keflex take 1 pill, 3 times a day for next 7 days, finish entire course - We sent urine for culture if it grows bacteria and we need to change, we will call you in a few days  2. For Dry mouth, start taking Fish Oil  Follow-up with Oncologist as discussed  Please schedule a follow-up appointment with Dr [name] within 1 week if UTI not improving, otherwise as scheduled within 1 month for Hot Flashes / Dry Mouth  If you have any other questions or concerns, please feel free to call the clinic to contact me. You may also schedule an earlier appointment if necessary.  However, if your symptoms get significantly worse, please go to the Emergency Department to seek immediate medical attention.  [name], [place]

## 2015-12-29 NOTE — Progress Notes (Signed)
   Subjective:    Patient ID: [name], female    DOB: 04-May-1961, 55 y.o.   MRN: [id number]  [name] is a 55 y.o. female presenting on 12/29/2015 for No chief complaint on file.   HPI   LEFT FLANK PAIN / URINARY FREQUENCY: - Patient reports symptoms started after waking up 2 days ago with Left flank pain, described as a "aching, throbbing" initially lasted about 1 hour then improved, then resolved and returned 2-4x over the past 2 days. Today improved - Also constipation, bought Milk of Magnesia but has not tried this, - Also complains of dry mouth and hotflashes, over the past few weeks - Recent UTI 08/2015, had significant dysuria at that time, treated with keflex 500 BID x 5 days with resolve - Has Tramadol and Tylenol - Admits mild suprapubic pain - Also complains dry mouth, with chemo anastrozole, has not tried fish oil - Denies any dysuria, hematuria, nausea, vomiting, abdominal pain  Social History  Substance Use Topics  . Smoking status: Former Smoker -- 1.00 packs/day for 20 years    Quit date: 11/14/2003  . Smokeless tobacco: Never Used  . Alcohol Use: No    Review of Systems Per HPI unless specifically indicated above     Objective:    There were no vitals taken for this visit.  Wt Readings from Last 3 Encounters:  12/03/15 177 lb 14.4 oz (80.695 kg)  11/22/15 177 lb 3.2 oz (80.377 kg)  10/18/15 173 lb 8 oz (78.699 kg)    Physical Exam  Constitutional: She is oriented to person, place, and time. She appears well-developed and well-nourished. No distress.  Well-appearing, comfortable  HENT:  Dry oral mucosa  Cardiovascular: Normal rate, regular rhythm, normal heart sounds and intact distal pulses.   No murmur heard. Pulmonary/Chest: Effort normal.  Abdominal: Soft. Bowel sounds are normal. She exhibits no distension and no mass. There is tenderness (mild suprapubic). There is no rebound and no guarding.  Musculoskeletal: She exhibits no edema.    Left Flank / Low Back - Non-tender to palpation, no CVAT  Neurological: She is alert and oriented to person, place, and time.  Skin: Skin is warm and dry. She is not diaphoretic.  Nursing note and vitals reviewed.      Assessment & Plan:   Problem List Items Addressed This Visit    UTI (urinary tract infection) - Primary    Consistent on UA and symptoms. Last UTI 08/2015 (culture negative, prior has had E Coli), afebrile and uncomplicated, some hematuria on micro  Plan: 1. Keflex 500 TID x 7 days 2. Urine culture - follow-up 3. Return criteria given      Relevant Medications   cephALEXin (KEFLEX) 500 MG capsule   Other Relevant Orders   Urine culture    Other Visit Diagnoses    Acute left flank pain        Relevant Orders    Urinalysis Dipstick (Completed)    POCT UA - Microscopic Only (Completed)       Meds ordered this encounter  Medications  . cephALEXin (KEFLEX) 500 MG capsule    Sig: Take 1 capsule (500 mg total) by mouth 3 (three) times daily.    Dispense:  21 capsule    Refill:  0      Follow up plan: Return in about 1 week (around 01/05/2016), or if symptoms worsen or fail to improve, for UTI.  Nobie Putnam, [place], PGY-3

## 2015-12-30 ENCOUNTER — Encounter: Payer: Self-pay | Admitting: Family Medicine

## 2015-12-30 NOTE — Assessment & Plan Note (Signed)
Consistent on UA and symptoms. Last UTI 08/2015 (culture negative, prior has had E Coli), afebrile and uncomplicated, some hematuria on micro  Plan: 1. Keflex 500 TID x 7 days 2. Urine culture - follow-up 3. Return criteria given

## 2015-12-31 LAB — URINE CULTURE: Colony Count: 50000

## 2016-01-13 ENCOUNTER — Encounter: Payer: Self-pay | Admitting: Family Medicine

## 2016-01-13 ENCOUNTER — Ambulatory Visit (INDEPENDENT_AMBULATORY_CARE_PROVIDER_SITE_OTHER): Payer: BLUE CROSS/BLUE SHIELD | Admitting: Family Medicine

## 2016-01-13 VITALS — BP 142/72 | HR 68 | Temp 98.4°F | Wt 178.0 lb

## 2016-01-13 DIAGNOSIS — N951 Menopausal and female climacteric states: Secondary | ICD-10-CM

## 2016-01-13 DIAGNOSIS — K5900 Constipation, unspecified: Secondary | ICD-10-CM | POA: Diagnosis not present

## 2016-01-13 DIAGNOSIS — J449 Chronic obstructive pulmonary disease, unspecified: Secondary | ICD-10-CM

## 2016-01-13 DIAGNOSIS — I10 Essential (primary) hypertension: Secondary | ICD-10-CM | POA: Diagnosis not present

## 2016-01-13 DIAGNOSIS — K219 Gastro-esophageal reflux disease without esophagitis: Secondary | ICD-10-CM

## 2016-01-13 MED ORDER — PREGABALIN 50 MG PO CAPS: 50.0000 mg | ORAL_CAPSULE | Freq: Every day | ORAL | Status: DC

## 2016-01-13 MED ORDER — POLYETHYLENE GLYCOL 3350 17 GM/SCOOP PO POWD: 17.0000 g | Freq: Every day | ORAL | Status: DC

## 2016-01-13 MED ORDER — OMEPRAZOLE 20 MG PO CPDR: 20.0000 mg | DELAYED_RELEASE_CAPSULE | Freq: Every day | ORAL | Status: DC

## 2016-01-13 MED ORDER — ALBUTEROL SULFATE HFA 108 (90 BASE) MCG/ACT IN AERS: 2.0000 | INHALATION_SPRAY | Freq: Four times a day (QID) | RESPIRATORY_TRACT | Status: DC | PRN

## 2016-01-13 NOTE — Assessment & Plan Note (Signed)
Trial miralax 17g daily until soft stools. May need to increase to 17g BID.

## 2016-01-13 NOTE — Patient Instructions (Signed)
Thank you for coming to see me today. It was a pleasure. Today we talked about:   Reflux: I will refill your omeprazole  COPD: I will refill your albuterol  Hypertension: No changes to your medications  Constipation: I will prescribe Miralax for your bowels  Please make an appointment to see me in 3 months for follow-up of hypertension and sooner for a physical.  If you have any questions or concerns, please do not hesitate to call the office at [phone].  Sincerely,  Cordelia Poche, MD   Pregabalin capsules What is this medicine? PREGABALIN (pre GAB a lin) is used to treat nerve pain from diabetes, shingles, spinal cord injury, and fibromyalgia. It is also used to control seizures in epilepsy. This medicine may be used for other purposes; ask your health care provider or pharmacist if you have questions. What should I tell my health care provider before I take this medicine? They need to know if you have any of these conditions: -bleeding problems -heart disease, including heart failure -history of alcohol or drug abuse -kidney disease -suicidal thoughts, plans, or attempt; a previous suicide attempt by you or a family member -an unusual or allergic reaction to pregabalin, gabapentin, other medicines, foods, dyes, or preservatives -pregnant or trying to get pregnant or trying to conceive with your partner -breast-feeding How should I use this medicine? Take this medicine by mouth with a glass of water. Follow the directions on the prescription label. You can take this medicine with or without food. Take your doses at regular intervals. Do not take your medicine more often than directed. Do not stop taking except on your doctor's advice. A special MedGuide will be given to you by the pharmacist with each prescription and refill. Be sure to read this information carefully each time. Talk to your pediatrician regarding the use of this medicine in children. Special care may be  needed. Overdosage: If you think you have taken too much of this medicine contact a poison control center or emergency room at once. NOTE: This medicine is only for you. Do not share this medicine with others. What if I miss a dose? If you miss a dose, take it as soon as you can. If it is almost time for your next dose, take only that dose. Do not take double or extra doses. What may interact with this medicine? -alcohol -certain medicines for blood pressure like captopril, enalapril, or lisinopril -certain medicines for diabetes, like pioglitazone or rosiglitazone -certain medicines for anxiety or sleep -narcotic medicines for pain This list may not describe all possible interactions. Give your health care provider a list of all the medicines, herbs, non-prescription drugs, or dietary supplements you use. Also tell them if you smoke, drink alcohol, or use illegal drugs. Some items may interact with your medicine. What should I watch for while using this medicine? Tell your doctor or healthcare professional if your symptoms do not start to get better or if they get worse. Visit your doctor or health care professional for regular checks on your progress. Do not stop taking except on your doctor's advice. You may develop a severe reaction. Your doctor will tell you how much medicine to take. Wear a medical identification bracelet or chain if you are taking this medicine for seizures, and carry a card that describes your disease and details of your medicine and dosage times. You may get drowsy or dizzy. Do not drive, use machinery, or do anything that needs mental alertness until  you know how this medicine affects you. Do not stand or sit up quickly, especially if you are an older patient. This reduces the risk of dizzy or fainting spells. Alcohol may interfere with the effect of this medicine. Avoid alcoholic drinks. If you have a heart condition, like congestive heart failure, and notice that you are  retaining water and have swelling in your hands or feet, contact your health care provider immediately. The use of this medicine may increase the chance of suicidal thoughts or actions. Pay special attention to how you are responding while on this medicine. Any worsening of mood, or thoughts of suicide or dying should be reported to your health care professional right away. This medicine has caused reduced sperm counts in some men. This may interfere with the ability to father a child. You should talk to your doctor or health care professional if you are concerned about your fertility. Women who become pregnant while using this medicine for seizures may enroll in the [place] Pregnancy Registry by calling [phone]. This registry collects information about the safety of antiepileptic drug use during pregnancy. What side effects may I notice from receiving this medicine? Side effects that you should report to your doctor or health care professional as soon as possible: -allergic reactions like skin rash, itching or hives, swelling of the face, lips, or tongue -breathing problems -changes in vision -chest pain -confusion -jerking or unusual movements of any part of your body -loss of memory -muscle pain, tenderness, or weakness -suicidal thoughts or other mood changes -swelling of the ankles, feet, hands -unusual bruising or bleeding Side effects that usually do not require medical attention (Report these to your doctor or health care professional if they continue or are bothersome.): -dizziness -drowsiness -dry mouth -headache -nausea -tremors -trouble sleeping -weight gain This list may not describe all possible side effects. Call your doctor for medical advice about side effects. You may report side effects to FDA at 1-800-FDA-1088. Where should I keep my medicine? Keep out of the reach of children. This medicine can be abused. Keep your medicine in a safe  place to protect it from theft. Do not share this medicine with anyone. Selling or giving away this medicine is dangerous and against the law. This medicine may cause accidental overdose and death if it taken by other adults, children, or pets. Mix any unused medicine with a substance like cat litter or coffee grounds. Then throw the medicine away in a sealed container like a sealed bag or a coffee can with a lid. Do not use the medicine after the expiration date. Store at room temperature between 15 and 30 degrees C (59 and 86 degrees F). NOTE: This sheet is a summary. It may not cover all possible information. If you have questions about this medicine, talk to your doctor, pharmacist, or health care provider.    2016, [employer]. (2013-12-26 15:38:53)

## 2016-01-13 NOTE — Assessment & Plan Note (Signed)
Controlled. Will refill omeprazole 20mg 

## 2016-01-13 NOTE — Progress Notes (Signed)
    Subjective    [name] is a 55 y.o. female that presents for a follow-up visit for:   1. GERD: Chronic issue. patient reports using omeprazole 20mg  daily, although she states taking it twice per day at times as directed by her pulmonologist.   2. COPD: She has been using her albuterol inhaler every day for the past week. She reports no coughing with intermittent dyspnea. Sometimes she experiences PND. She sleeps on one pillow. No orthopnea. She reports no swelling in her legs.   3. Hypertension: She is adherent with hydrochlorothiazide and metoprolol. Patient reports no chest pain. She has some dyspnea at times secondary to her COPD.   4. Constipation: she is having bowel movements at most once per day, but sometimes every other day. Her bowel movements are difficult and hard.   Social History  Substance Use Topics  . Smoking status: Former Smoker -- 1.00 packs/day for 20 years    Quit date: 11/14/2003  . Smokeless tobacco: Never Used  . Alcohol Use: No    Allergies  Allergen Reactions  . Codeine Other (See Comments)    Avoids-felt bad when took before    No orders of the defined types were placed in this encounter.    ROS  Per HPI   Objective   BP 142/72 mmHg  Pulse 68  Temp(Src) 98.4 F (36.9 C) (Oral)  Wt 178 lb (80.74 kg)  Vital signs reviewed  General: Well appearing, no distress Respiratory/Chest: Clear to auscultation bilaterally. Unlabored work of breathing. No wheezing or rales.  Assessment and Plan    COPD  GOLD III Patient reports increased use of albuterol. No coughing. Not currently in exacerbation. Likely secondary to weather changes. Discussed with patient. Refilled albuterol.  GERD (gastroesophageal reflux disease) Controlled. Will refill omeprazole 20mg   HYPERTENSION, BENIGN SYSTEMIC Slightly above goal today. Adherent with regimen. No symptoms. No changes today.  Menopausal syndrome (hot flashes) Patient did not tolerate Effexor or  gabapentin. Will trial Lyrica 50mg  qhs.  Constipation Trial miralax 17g daily until soft stools. May need to increase to 17g BID.

## 2016-01-13 NOTE — Assessment & Plan Note (Signed)
Slightly above goal today. Adherent with regimen. No symptoms. No changes today.

## 2016-01-13 NOTE — Assessment & Plan Note (Signed)
Patient reports increased use of albuterol. No coughing. Not currently in exacerbation. Likely secondary to weather changes. Discussed with patient. Refilled albuterol.

## 2016-01-13 NOTE — Assessment & Plan Note (Signed)
Patient did not tolerate Effexor or gabapentin. Will trial Lyrica 50mg  qhs.

## 2016-01-14 ENCOUNTER — Ambulatory Visit (HOSPITAL_BASED_OUTPATIENT_CLINIC_OR_DEPARTMENT_OTHER): Payer: BLUE CROSS/BLUE SHIELD | Admitting: Nurse Practitioner

## 2016-01-14 ENCOUNTER — Ambulatory Visit (HOSPITAL_COMMUNITY)
Admission: RE | Admit: 2016-01-14 | Discharge: 2016-01-14 | Disposition: A | Discharge status: Home / Self Care | Payer: BLUE CROSS/BLUE SHIELD | Source: Ambulatory Visit | Attending: Nurse Practitioner | Admitting: Nurse Practitioner

## 2016-01-14 ENCOUNTER — Encounter: Payer: Self-pay | Admitting: Hematology and Oncology

## 2016-01-14 ENCOUNTER — Encounter: Payer: Self-pay | Admitting: Nurse Practitioner

## 2016-01-14 VITALS — BP 141/71 | HR 74 | Temp 97.9°F | Resp 18 | Wt 181.2 lb

## 2016-01-14 DIAGNOSIS — C50411 Malignant neoplasm of upper-outer quadrant of right female breast: Secondary | ICD-10-CM | POA: Diagnosis not present

## 2016-01-14 DIAGNOSIS — R0600 Dyspnea, unspecified: Secondary | ICD-10-CM

## 2016-01-14 DIAGNOSIS — R918 Other nonspecific abnormal finding of lung field: Secondary | ICD-10-CM | POA: Diagnosis not present

## 2016-01-14 NOTE — Progress Notes (Signed)
t called requesting itemized statements for [id number] for a cancer policy.  I printed off bills for 08/04/15 & 08/27/15.  She will pick them up on 01/14/16.

## 2016-01-14 NOTE — Progress Notes (Signed)
CLINIC:  Cancer Survivorship   REASON FOR VISIT:  Routine follow-up post-treatment for a recent history of breast cancer.  BRIEF ONCOLOGIC HISTORY:    Breast cancer of upper-outer quadrant of right female breast ([place])   07/08/2015 Mammogram Right breast calcifications 11 mm span   08/04/2015 Initial Diagnosis Biopsy right breast: Invasive ductal carcinoma with DCIS with calcifications, lymphovascular invasion is identified, grade 2-3, ER 100%, PR 100% Ki-67 20%, HER-2 negative ratio 1.46   08/04/2015 Clinical Stage Stage IA: T1b N0   08/18/2015 Definitive Surgery Right  lumpectomy: invasive ductal carcinoma, grade 2, 0.6 cm, with DCIS high-grade, margins negative, 0/2 lymph nodes negative, ER 100%, PR positive, HER-2 negative, Ki-67 20%   08/18/2015 Pathologic Stage Stage IA: T1b N0   08/18/2015 Oncotype testing Insufficient tissue to perform   09/21/2015 - 10/20/2015 Radiation Therapy Adjuvant radiation therapy: 1) Right Breast / 40.05 Gy in 15 fractions 2) Right breast boost / 10 Gy in 5 fractions   10/08/2015 - 10/11/2015 Hospital Admission cellulitis of the right breast    Anti-estrogen oral therapy Anastrozole 1 mg daily once hot flashes better controlled. Planned duration of therapy 5 years.     INTERVAL HISTORY:  [name] presents to the Survivorship Clinic today for our initial meeting to review her survivorship care plan detailing her treatment course for breast cancer, as well as monitoring long-term side effects of that treatment, education regarding health maintenance, screening, and overall wellness and health promotion.     Overall, [name] reports feeling okay since completing her radiation therapy approximately three months ago aside from the shortness of breath that she developed recently.  She states that she first noted it after beginning the gabapentin for her hot flashes.  She states that she noticed a constant shortness of breath and chest pounding about a week after  starting therapy. She discontinued therapy and her symptoms improved.  She denies any chest pain or palpitations.  She states that she continues with dyspnea that she thinks may be a bit worse than her baseline dyspnea that she has related to her sarcoidosis.  She denies fever, chills, or productive cough.  She is continuing to take her anastrozole with significant hot flashes, but she is reluctant to change it as she knows is will help prevent her cancer from returning.  She saw Dr. [name] yesterday who gave her a prescription for pregabalin to use in place of the gabapentin. She did not see Dr. Melvyn Novas, her pulmonologist, at the end of January 2017.  She denies headache or bone pain.  She continues with fatigue.  She has a good appetite and is distressed about the weight she has gained since completing treatment.    REVIEW OF SYSTEMS:  General: Fatigue and hot flashes as above. Denies fever, chills, unintentional weight loss, or generalized fatigue.  HEENT: Denies visual changes, hearing loss, mouth sores or difficulty swallowing. Cardiac: Denies palpitations, chest pain, and lower extremity edema.  Respiratory: Dyspnea as above.  Breast: Denies any new nodularity, masses, tenderness, nipple changes, or nipple discharge.  GI: Occasional constipation. Denies abdominal pain, diarrhea, nausea, or vomiting.  GU: Denies dysuria, hematuria, vaginal bleeding, vaginal discharge, or vaginal dryness.  Musculoskeletal: Denies joint or bone pain.  Neuro: Denies recent fall or numbness / tingling in her extremities. Skin: Denies rash, pruritis, or open wounds.  Psych: Denies depression, anxiety, insomnia, or memory loss.   A 14-point review of systems was completed and was negative, except as noted above.  ONCOLOGY TREATMENT TEAM:  1. Surgeon:  Dr. Barry Dienes at [facility]  2. Medical Oncologist: Dr. Lindi Adie 3. Radiation Oncologist: Dr. Isidore Moos    PAST MEDICAL/SURGICAL HISTORY:  Past Medical  History  Diagnosis Date  . Hypertension   . Sarcoidosis ([place]) STABLE PER CXR JUNE 2013  . Long-term current use of steroids SYMBICORT INHALER  . Allergic rhinitis   . HTN (hypertension)   . Iron deficiency anemia   . COPD, mild ([place]) FOLLOWED BY DR Melvyn Novas  . Sickle cell trait ([place])   . 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 Shortness of breath   . GERD (gastroesophageal reflux disease)    Past Surgical History  Procedure Laterality Date  . Cesarean section  1986    W/ BILATERAL TUBAL LIGATION  . Total robotic assisted laparoscopic hysterectomy  12-30-2010    SYMPTOMATIC UTERINE FIBROIDS  . Upper teeth extraction's  1992  . Median rectus repair  05/22/2012    Procedure: MEDIAN RECTUS REPAIR;  Surgeon: Dara Hoyer, MD;  Location: [facility];  Service: Ophthalmology;  Laterality: Bilateral;  INFERIOR RECTUS RESECTION WITH ADJUSTIBLE SUTURES RIGHT EYE   . Adjustable suture manipulation  05/22/2012    Procedure: ADJUSTABLE SUTURE MANIPULATION;  Surgeon: Dara Hoyer, MD;  [facility];  Service: Ophthalmology;  Laterality: Right;  . Eye surgery    . Radioactive seed guided mastectomy with axillary sentinel lymph node biopsy Right 08/18/2015    Procedure: RADIOACTIVE SEED GUIDED PARTIAL MASTECTOMY WITH AXILLARY SENTINEL LYMPH NODE BIOPSY;  Surgeon: Stark Klein, MD;  Location: [place];  Service: General;  Laterality: Right;     ALLERGIES:  Allergies  Allergen Reactions  . Codeine Other (See Comments)    Avoids-felt bad when took before     CURRENT MEDICATIONS:  Current Outpatient Prescriptions on File Prior to Visit  Medication Sig Dispense Refill  . acetaminophen (TYLENOL) 500 MG tablet Take 1,000 mg by mouth every 6 (six) hours as needed for fever.    Marland Kitchen albuterol (PROVENTIL HFA;VENTOLIN HFA) 108 (90 Base) MCG/ACT inhaler Inhale 2 puffs into the lungs every 6 (six) hours as needed for wheezing or shortness of breath. 1 Inhaler 5  .  anastrozole (ARIMIDEX) 1 MG tablet Take 1 tablet (1 mg total) by mouth daily. 90 tablet 3  . aspirin EC 81 MG EC tablet Take 1 tablet (81 mg total) by mouth daily. 30 tablet 11  . budesonide-formoterol (SYMBICORT) 160-4.5 MCG/ACT inhaler Inhale 2 puffs into the lungs 2 (two) times daily. 1 Inhaler 4  . fluticasone (FLONASE) 50 MCG/ACT nasal spray Place 2 sprays into both nostrils daily as needed for allergies. 16 g 11  . hydrochlorothiazide (HYDRODIURIL) 12.5 MG tablet Take 1 tablet (12.5 mg total) by mouth daily. 90 tablet 3  . metoprolol tartrate (LOPRESSOR) 25 MG tablet TAKE ONE TABLET BY MOUTH TWICE DAILY 60 tablet 5  . Multiple Vitamin (MULTIVITAMIN) capsule Take 1 capsule by mouth daily.    Marland Kitchen omeprazole (PRILOSEC) 20 MG capsule Take 1 capsule (20 mg total) by mouth daily. 30 capsule 5  . polyethylene glycol powder (GLYCOLAX/MIRALAX) powder Take 17 g by mouth daily. Until daily soft stools 3350 g 1  . pregabalin (LYRICA) 50 MG capsule Take 1 capsule (50 mg total) by mouth at bedtime. (Patient not taking: Reported on 01/14/2016) 30 capsule 2   No current facility-administered medications on file prior to visit.     ONCOLOGIC FAMILY HISTORY:  Family History  Problem  Relation Age of Onset  . Hyperlipidemia Mother   . Asthma Mother   . Lupus Mother   . Hypertension Father   . Hyperlipidemia Father   . Hypertension Sister   . Hypertension Brother   . Cancer Neg Hx   . Diabetes Neg Hx   . Coronary artery disease Neg Hx   . Breast cancer Mother 32     GENETIC COUNSELING/TESTING: No    SOCIAL HISTORY:  AGNESS SIBRIAN is single and lives alone in [place], [place].  She has 3 children. [name] is currently working.  She is a former smoker and denies any current or history of alcohol or illicit drug use.     PHYSICAL EXAMINATION:  Vital Signs: Filed Vitals:   01/14/16 1324 01/14/16 1328  BP: 168/69 141/71  Pulse: 74   Temp: 97.9 F (36.6 C)   Resp: 18    ECOG  Performance Status: 0  General: Well-nourished, well-appearing female in no acute distress.  She is unaccompanied in clinic today.   HEENT: Head is atraumatic and normocephalic.  Pupils equal and reactive to light and accomodation. Conjunctivae clear without exudate.  Sclerae anicteric. Oral mucosa is pink, moist, and intact without lesions.  Oropharynx is pink without lesions or erythema.  Lymph: No cervical, supraclavicular, infraclavicular, or axillary lymphadenopathy noted on palpation.  Cardiovascular: Regular rate and rhythm without murmurs, rubs, or gallops. Respiratory: Chest expansion symmetric without accessory muscle use on inspiration or expiration. Decreased air movement throughout, but clear to ausculation. GI: Abdomen soft and round. No tenderness to palpation. Bowel sounds normoactive in 4 quadrants.    GU: Deferred.  Neuro: No focal deficits. Steady gait.  Psych: Mood and affect normal and appropriate for situation.  Extremities: No edema, cyanosis, or clubbing.  Skin: Warm and dry. No open lesions noted.   LABORATORY DATA:  None for this visit.  DIAGNOSTIC IMAGING:  None for this visit.     ASSESSMENT AND PLAN:   1. History of breast cancer: Stage IA invasive ductal carcinoma of the right breast (07/2015), ER positive, PR positive, HER2/neu negative, S/P lumpectomy (08/2015) followed by adjuvant radiation therapy (10/2015) now on adjuvant endocrine therapy with anastrozole which she began in early February 2017.  [name] is doing well without clinical symptoms worrisome for disease recurrence. She will follow-up with her medical oncologist,  Dr. Lindi Adie, in April 2017 with history and physical examination per surveillance protocol.  Her hot flashes have worsened since beginning the anastrozole (she had significant hot flashes at baseline).  Dr. [name] placed her on pregabalin yesterday. She is going to check with the pharmacy about her insurance coverage and if not cost  prohibitive, will plan to start this medication.  If she is unable to afford it or if it does not work, she will call to notify us and we can consider an additional agent such as clonidine to see if it can help improve her symptoms. She will continue her anti-estrogen therapy with anastrozole at this time and report any new or increased side effects of the medication. A comprehensive survivorship care plan and treatment summary was reviewed with the patient today detailing her breast cancer diagnosis, treatment course, potential late/long-term effects of treatment, appropriate follow-up care with recommendations for the future, and patient education resources.  A copy of this summary, along with a letter will be sent to the patient's primary care provider via in basket message after today's visit.  [name] is welcome to return to  the Survivorship Clinic in the future, as needed; no follow-up will be scheduled at this time.    2. Dyspnea It is reassuring that [name]'s dyspnea has improved with the discontinuation of the gabapentin, although dysnpea is not a commonly reported adverse effect in the literature.  I have elected to check a chest xray today to ensure that she does not have an acute process contributing to her dyspnea, and will call her with the results, once available.  She does see a pulmonologist, Dr. Melvyn Novas, and if needed, we can facilitate getting her back to see him.  3. Bone health:  Given [name]'s age/history of breast cancer, her current treatment regimen including endocrine therapy with anastrozole, and her medical history of sarcoidosis for which she has received steroids, she is at risk for bone demineralization.  Per her report, her last DEXA scan was more than two years ago and was normal. She will discuss this further with Dr. Lindi Adie at her appointment in April.  If we are able to better manage her hot flashes and she can tolerate the anastrozole, we will need to update her bone  density.  In the meantime, she was encouraged to increase her consumption of foods rich in calcium and vitamin D as well as to increase her weight-bearing activities.  She was given education on specific activities to promote bone health.  4. Cancer screening:  Due to [name] history and her age, she should receive screening for skin cancers, colon cancer, and gynecologic cancers.  The information and recommendations are listed on the patient's comprehensive care plan/treatment summary and were reviewed in detail with the patient.    5. Health maintenance and wellness promotion: [name] was encouraged to consume 5-7 servings of fruits and vegetables per day. We reviewed the "Nutrition Rainbow" handout, as well as discussed recommendations to maximize nutrition and minimize recurrence, such as increased intake of fruits, vegetables, lean proteins, and minimizing the intake of red meats and processed foods.  She was also encouraged to engage in moderate to vigorous exercise for 30 minutes per day most days of the week. We discussed the [employer] fitness program, which is designed for cancer survivors to help them become more physically fit after cancer treatments.  She was instructed to limit her alcohol consumption and continue to abstain from tobacco use.  A copy of the "Take Control of Your Health" brochure was given to her reinforcing these recommendations.   6. Support services/counseling: It is not uncommon for this period of the patient's cancer care trajectory to be one of many emotions and stressors.  We discussed an opportunity for her to participate in the next session of [facility] ("Finding Your New Normal") support group series designed for patients after they have completed treatment.   [name] was encouraged to take advantage of our many other support services programs, support groups, and/or counseling in coping with her new life as a cancer survivor after completing anti-cancer  treatment.  She was offered support today through active listening and expressive supportive counseling.  She was given information regarding our available services and encouraged to contact me with any questions or for help enrolling in any of our support group/programs.    A total of 50 minutes of face-to-face time was spent with this patient with greater than 50% of that time in counseling and care-coordination.   Sylvan Cheese, NP  Survivorship Program [place] [phone]   Note: PRIMARY CARE PROVIDER Deidre Ala  [name], [place] [phone]

## 2016-01-21 ENCOUNTER — Telehealth: Payer: Self-pay | Admitting: Family Medicine

## 2016-01-21 NOTE — Telephone Encounter (Signed)
Pt called because the prescription for Lyrica was not able to be filled since the doctor is not [place] certified. We need a new prescription with a faculty member to write. If we can not fax this or call this in please let patient know so that she can come and pick this up. jw

## 2016-02-01 ENCOUNTER — Telehealth: Payer: Self-pay | Admitting: Hematology and Oncology

## 2016-02-01 ENCOUNTER — Ambulatory Visit (HOSPITAL_BASED_OUTPATIENT_CLINIC_OR_DEPARTMENT_OTHER): Payer: BLUE CROSS/BLUE SHIELD | Admitting: Hematology and Oncology

## 2016-02-01 ENCOUNTER — Encounter: Payer: Self-pay | Admitting: Hematology and Oncology

## 2016-02-01 VITALS — BP 137/79 | HR 75 | Temp 97.6°F | Resp 18 | Wt 180.2 lb

## 2016-02-01 DIAGNOSIS — N951 Menopausal and female climacteric states: Secondary | ICD-10-CM

## 2016-02-01 DIAGNOSIS — R232 Flushing: Secondary | ICD-10-CM | POA: Insufficient documentation

## 2016-02-01 DIAGNOSIS — C50411 Malignant neoplasm of upper-outer quadrant of right female breast: Secondary | ICD-10-CM

## 2016-02-01 MED ORDER — GABAPENTIN 100 MG PO CAPS: 100.0000 mg | ORAL_CAPSULE | Freq: Every day | ORAL | Status: DC

## 2016-02-01 NOTE — Progress Notes (Signed)
Patient Care Team: Mariel Aloe, MD as PCP - General (Family Medicine) Nicholas Lose, MD as Consulting Physician (Hematology and Oncology) Eppie Gibson, MD as Attending Physician (Radiation Oncology) Stark Klein, MD as Consulting Physician (General Surgery) Sylvan Cheese, NP as Nurse Practitioner (Hematology and Oncology)  DIAGNOSIS: Breast cancer of upper-outer quadrant of right female breast [facility])   Staging form: Breast, AJCC 7th Edition     Clinical stage from 08/04/2015: Stage IA (T1b, N0, M0) - Unsigned     Pathologic stage from 08/18/2015: Stage IA (T1b, N0, cM0) - Unsigned  SUMMARY OF ONCOLOGIC HISTORY:   Breast cancer of upper-outer quadrant of right female breast ([place])   07/08/2015 Mammogram Right breast calcifications 11 mm span   08/04/2015 Initial Diagnosis Biopsy right breast: Invasive ductal carcinoma with DCIS with calcifications, lymphovascular invasion is identified, grade 2-3, ER 100%, PR 100% Ki-67 20%, HER-2 negative ratio 1.46   08/04/2015 Clinical Stage Stage IA: T1b N0   08/18/2015 Definitive Surgery Right  lumpectomy: invasive ductal carcinoma, grade 2, 0.6 cm, with DCIS high-grade, margins negative, 0/2 lymph nodes negative, ER 100%, PR positive, HER-2 negative, Ki-67 20%   08/18/2015 Pathologic Stage Stage IA: T1b N0   08/18/2015 Oncotype testing Insufficient tissue to perform   09/21/2015 - 10/20/2015 Radiation Therapy Adjuvant radiation therapy: 1) Right Breast / 40.05 Gy in 15 fractions 2) Right breast boost / 10 Gy in 5 fractions   10/08/2015 - 10/11/2015 Hospital Admission cellulitis of the right breast   11/16/2015 -  Anti-estrogen oral therapy Anastrozole 1 mg daily once hot flashes better controlled. Planned duration of therapy 5 years.     CHIEF COMPLIANT: Severe hot flashes  INTERVAL HISTORY: [name] is a 55 year old lady with above-mentioned history of right breast cancer currently on oral antiestrogen therapy with anastrozole. She has had  severe hot flashes even before starting antiestrogen therapy. At that time we have prescribed her Effexor but because of nightmares she stopped taking it. When she started antiestrogen therapy, I started on Neurontin after taking it for a few days she stopped that as well because she felt Neurontin was causing respiratory difficulties. She was recently placed on Lyrica by Dr. Barry Dienes and she reports to me that it does help her sleep better but overall no change in the hot flash symptoms. She gets a hot flashes almost every 45 minutes.  REVIEW OF SYSTEMS:   Constitutional: Denies fevers, chills or abnormal weight loss, severe hot flashes Eyes: Denies blurriness of vision Ears, nose, mouth, throat, and face: Denies mucositis or sore throat Respiratory: Denies cough, dyspnea or wheezes Cardiovascular: Denies palpitation, chest discomfort Gastrointestinal:  Denies nausea, heartburn or change in bowel habits Skin: Denies abnormal skin rashes Lymphatics: Denies new lymphadenopathy or easy bruising Neurological:Denies numbness, tingling or new weaknesses Behavioral/Psych: Mood is stable, no new changes  Extremities: No lower extremity edema Breast:  denies any pain or lumps or nodules in either breasts All other systems were reviewed with the patient and are negative.  I have reviewed the past medical history, past surgical history, social history and family history with the patient and they are unchanged from previous note.  ALLERGIES:  is allergic to codeine.  MEDICATIONS:  Current Outpatient Prescriptions  Medication Sig Dispense Refill  . acetaminophen (TYLENOL) 500 MG tablet Take 1,000 mg by mouth every 6 (six) hours as needed for fever.    Marland Kitchen albuterol (PROVENTIL HFA;VENTOLIN HFA) 108 (90 Base) MCG/ACT inhaler Inhale 2 puffs into the lungs  every 6 (six) hours as needed for wheezing or shortness of breath. 1 Inhaler 5  . anastrozole (ARIMIDEX) 1 MG tablet Take 1 tablet (1 mg total) by mouth  daily. 90 tablet 3  . aspirin EC 81 MG EC tablet Take 1 tablet (81 mg total) by mouth daily. 30 tablet 11  . budesonide-formoterol (SYMBICORT) 160-4.5 MCG/ACT inhaler Inhale 2 puffs into the lungs 2 (two) times daily. 1 Inhaler 4  . fluticasone (FLONASE) 50 MCG/ACT nasal spray Place 2 sprays into both nostrils daily as needed for allergies. 16 g 11  . gabapentin (NEURONTIN) 100 MG capsule Take 1 capsule (100 mg total) by mouth at bedtime. 30 capsule 3  . hydrochlorothiazide (HYDRODIURIL) 12.5 MG tablet Take 1 tablet (12.5 mg total) by mouth daily. 90 tablet 3  . metoprolol tartrate (LOPRESSOR) 25 MG tablet TAKE ONE TABLET BY MOUTH TWICE DAILY 60 tablet 5  . Multiple Vitamin (MULTIVITAMIN) capsule Take 1 capsule by mouth daily.    Marland Kitchen omeprazole (PRILOSEC) 20 MG capsule Take 1 capsule (20 mg total) by mouth daily. 30 capsule 5  . polyethylene glycol powder (GLYCOLAX/MIRALAX) powder Take 17 g by mouth daily. Until daily soft stools 3350 g 1  . pregabalin (LYRICA) 50 MG capsule Take 1 capsule (50 mg total) by mouth at bedtime. (Patient not taking: Reported on 01/14/2016) 30 capsule 2   No current facility-administered medications for this visit.    PHYSICAL EXAMINATION: ECOG PERFORMANCE STATUS: 1 - Symptomatic but completely ambulatory  Filed Vitals:   02/01/16 1456  BP: 137/79  Pulse: 75  Temp: 97.6 F (36.4 C)  Resp: 18   Filed Weights   02/01/16 1456  Weight: 180 lb 3.2 oz (81.738 kg)    GENERAL:alert, no distress and comfortable SKIN: skin color, texture, turgor are normal, no rashes or significant lesions EYES: normal, Conjunctiva are pink and non-injected, sclera clear OROPHARYNX:no exudate, no erythema and lips, buccal mucosa, and tongue normal  NECK: supple, thyroid normal size, non-tender, without nodularity LYMPH:  no palpable lymphadenopathy in the cervical, axillary or inguinal LUNGS: clear to auscultation and percussion with normal breathing effort HEART: regular rate &  rhythm and no murmurs and no lower extremity edema ABDOMEN:abdomen soft, non-tender and normal bowel sounds MUSCULOSKELETAL:no cyanosis of digits and no clubbing  NEURO: alert & oriented x 3 with fluent speech, no focal motor/sensory deficits EXTREMITIES: No lower extremity edema BREAST: No palpable masses or nodules in either right or left breasts. No palpable axillary supraclavicular or infraclavicular adenopathy no breast tenderness or nipple discharge. (exam performed in the presence of a chaperone)  LABORATORY DATA:  I have reviewed the data as listed   Chemistry      Component Value Date/Time   NA 140 10/11/2015 0752   K 3.5 10/11/2015 0752   CL 103 10/11/2015 0752   CO2 31 10/11/2015 0752   BUN <5* 10/11/2015 0752   CREATININE 0.56 10/11/2015 0752   CREATININE 0.64 02/04/2015 1619      Component Value Date/Time   CALCIUM 9.0 10/11/2015 0752   ALKPHOS 72 10/08/2015 1121   AST 18 10/08/2015 1121   ALT 12* 10/08/2015 1121   BILITOT 0.5 10/08/2015 1121       Lab Results  Component Value Date   WBC 8.0 10/11/2015   HGB 11.4* 10/11/2015   HCT 33.5* 10/11/2015   MCV 79.2 10/11/2015   PLT 271 10/11/2015   NEUTROABS 15.4* 10/08/2015   ASSESSMENT & PLAN:  Breast cancer of upper-outer quadrant of  right female breast ([place]) Right breast invasive ductal carcinoma status post lumpectomy on 08/18/2015, grade 2, 0.6 cm, with DCIS high-grade, margins negative, 0/2 lymph nodes negative, ER 100%, PR positive, HER-2 negative, Ki-67 20%, T1bN0 stage IA,  Adjuvant radiation from 09/21/2015 to 10/20/2015  Current treatment: Adjuvant antiestrogen therapy with anastrozole 1 mg daily 5 years started January 2017 Severe hot flashes: Effexor XR not working. I prescribed her Neurontin and she took it for a few weeks and stopped it because she felt that the breathing was worse. She had seen Dr. Barry Dienes recently who prescribed her Lyrica. She has been on for the past 3 days and tells that it  does help her go to sleep but it has not been very effective on the hot flashes. After lengthy discussion we decided to give Neurontin another chance. She will take Neurontin 100 mg at bedtime. I also discussed the possibility of taking Neurontin with Effexor. However she felt that the Effexor was giving her not bad nightmares and did not want to take it.  Return to clinic in 6 months for toxicity check and follow up.  No orders of the defined types were placed in this encounter.   The patient has a good understanding of the overall plan. she agrees with it. she will call with any problems that may develop before the next visit here.   Rulon Eisenmenger, MD 02/01/2016

## 2016-02-01 NOTE — Telephone Encounter (Signed)
Gave patient avs report and appointments for September.  °

## 2016-02-01 NOTE — Assessment & Plan Note (Signed)
Right breast invasive ductal carcinoma status post lumpectomy on 08/18/2015, grade 2, 0.6 cm, with DCIS high-grade, margins negative, 0/2 lymph nodes negative, ER 100%, PR positive, HER-2 negative, Ki-67 20%, T1bN0 stage IA,  Adjuvant radiation from 09/21/2015 to 10/20/2015  Current treatment: Adjuvant antiestrogen therapy with anastrozole 1 mg daily 5 years started January 2017 Severe hot flashes: Effexor XR not working. Currently on Neurontin 100 mg at bedtime.  Return to clinic in 6 months for toxicity check and follow up.

## 2016-02-17 ENCOUNTER — Ambulatory Visit: Payer: BLUE CROSS/BLUE SHIELD | Admitting: Hematology and Oncology

## 2016-02-21 ENCOUNTER — Other Ambulatory Visit: Payer: Self-pay | Admitting: Family Medicine

## 2016-02-21 ENCOUNTER — Other Ambulatory Visit: Payer: Self-pay | Admitting: Internal Medicine

## 2016-02-22 ENCOUNTER — Other Ambulatory Visit: Payer: Self-pay | Admitting: *Deleted

## 2016-02-22 DIAGNOSIS — J309 Allergic rhinitis, unspecified: Secondary | ICD-10-CM

## 2016-02-22 NOTE — Telephone Encounter (Signed)
Refill approved. Sent to pharmacy. 

## 2016-02-23 MED ORDER — FLUTICASONE PROPIONATE 50 MCG/ACT NA SUSP: 2.0000 | Freq: Every day | NASAL | Status: DC | PRN

## 2016-03-25 ENCOUNTER — Other Ambulatory Visit: Payer: Self-pay | Admitting: Family Medicine

## 2016-04-08 ENCOUNTER — Other Ambulatory Visit: Payer: Self-pay | Admitting: Internal Medicine

## 2016-04-11 ENCOUNTER — Other Ambulatory Visit: Payer: Self-pay | Admitting: Internal Medicine

## 2016-04-12 ENCOUNTER — Telehealth: Payer: Self-pay | Admitting: Internal Medicine

## 2016-04-13 NOTE — Telephone Encounter (Signed)
Made the pt an appt for 05/08/16 at 4:30 Dr Melvyn Novas [phone] Camelia Eng pt doesn't get off till 3:30pm

## 2016-04-13 NOTE — Telephone Encounter (Signed)
lmtcb x1 for pt. Pt needs OV for refill or sample.

## 2016-04-15 ENCOUNTER — Other Ambulatory Visit: Payer: Self-pay | Admitting: Internal Medicine

## 2016-04-19 ENCOUNTER — Other Ambulatory Visit: Payer: Self-pay | Admitting: Family Medicine

## 2016-05-08 ENCOUNTER — Ambulatory Visit: Payer: BLUE CROSS/BLUE SHIELD | Admitting: Internal Medicine

## 2016-05-10 ENCOUNTER — Encounter: Payer: Self-pay | Admitting: Family Medicine

## 2016-05-10 ENCOUNTER — Ambulatory Visit (INDEPENDENT_AMBULATORY_CARE_PROVIDER_SITE_OTHER): Payer: BLUE CROSS/BLUE SHIELD | Admitting: Family Medicine

## 2016-05-10 VITALS — BP 150/82 | HR 94 | Temp 98.7°F | Ht 65.0 in | Wt 175.6 lb

## 2016-05-10 DIAGNOSIS — I10 Essential (primary) hypertension: Secondary | ICD-10-CM

## 2016-05-10 DIAGNOSIS — E785 Hyperlipidemia, unspecified: Secondary | ICD-10-CM | POA: Diagnosis not present

## 2016-05-10 DIAGNOSIS — Z1159 Encounter for screening for other viral diseases: Secondary | ICD-10-CM

## 2016-05-10 DIAGNOSIS — IMO0001 Reserved for inherently not codable concepts without codable children: Secondary | ICD-10-CM

## 2016-05-10 DIAGNOSIS — Z0000 Encounter for general adult medical examination without abnormal findings: Secondary | ICD-10-CM | POA: Diagnosis not present

## 2016-05-10 DIAGNOSIS — E663 Overweight: Secondary | ICD-10-CM

## 2016-05-10 DIAGNOSIS — Z114 Encounter for screening for human immunodeficiency virus [HIV]: Secondary | ICD-10-CM

## 2016-05-10 MED ORDER — METOPROLOL TARTRATE 25 MG PO TABS: 25.0000 mg | ORAL_TABLET | Freq: Two times a day (BID) | ORAL | Status: DC

## 2016-05-10 NOTE — Progress Notes (Signed)
Subjective    [name] is a 55 y.o. female that presents for yearly physical exam.   Concerns:  None  Goals    None      No obstetric history on file. Wt Readings from Last 3 Encounters:  05/10/16 175 lb 9.6 oz (79.652 kg)  02/01/16 180 lb 3.2 oz (81.738 kg)  01/14/16 181 lb 3.2 oz (82.192 kg)   Last period:   Regular periods: No Heavy bleeding: No  Sexually active: Yes Birth control or hormonal therapy: N/A Hx of STD: Patient desires STD screening Dyspareunia: No Hot flashes: Yes Vaginal discharge: No Dysuria: No   Last mammogram: 07/23/2015 Breast mass or concerns: History of breast cancer, currently on treatment Last Pap: 2007  History of abnormal pap: No    Past Medical History  Diagnosis Date  . Hypertension   . Sarcoidosis ([place]) STABLE PER CXR JUNE 2013  . Long-term current use of steroids SYMBICORT INHALER  . Allergic rhinitis   . HTN (hypertension)   . Iron deficiency anemia   . COPD, mild ([place]) FOLLOWED BY DR Melvyn Novas  . Sickle cell trait ([place])   . 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 Shortness of breath   . GERD (gastroesophageal reflux disease)     Past Surgical History  Procedure Laterality Date  . Cesarean section  1986    W/ BILATERAL TUBAL LIGATION  . Total robotic assisted laparoscopic hysterectomy  12-30-2010    SYMPTOMATIC UTERINE FIBROIDS  . Upper teeth extraction's  1992  . Median rectus repair  05/22/2012    Procedure: MEDIAN RECTUS REPAIR;  Surgeon: Dara Hoyer, MD;  Location[facility];  Service: Ophthalmology;  Laterality: Bilateral;  INFERIOR RECTUS RESECTION WITH ADJUSTIBLE SUTURES RIGHT EYE   . Adjustable suture manipulation  05/22/2012    Procedure: ADJUSTABLE SUTURE MANIPULATION;  Surgeon: Dara Hoyer, MD;  Location: [facility];  Service: Ophthalmology;  Laterality: Right;  . Eye surgery    . Radioactive seed guided mastectomy with axillary sentinel lymph node biopsy Right 08/18/2015   Procedure: RADIOACTIVE SEED GUIDED PARTIAL MASTECTOMY WITH AXILLARY SENTINEL LYMPH NODE BIOPSY;  Surgeon: Stark Klein, MD;  Location: [place];  Service: General;  Laterality: Right;    Current Outpatient Prescriptions on File Prior to Visit  Medication Sig Dispense Refill  . acetaminophen (TYLENOL) 500 MG tablet Take 1,000 mg by mouth every 6 (six) hours as needed for fever.    Marland Kitchen albuterol (PROVENTIL HFA;VENTOLIN HFA) 108 (90 Base) MCG/ACT inhaler Inhale 2 puffs into the lungs every 6 (six) hours as needed for wheezing or shortness of breath. 1 Inhaler 5  . anastrozole (ARIMIDEX) 1 MG tablet Take 1 tablet (1 mg total) by mouth daily. 90 tablet 3  . aspirin EC 81 MG EC tablet Take 1 tablet (81 mg total) by mouth daily. 30 tablet 11  . fluticasone (FLONASE) 50 MCG/ACT nasal spray Place 2 sprays into both nostrils daily as needed for allergies. 16 g 11  . gabapentin (NEURONTIN) 100 MG capsule Take 1 capsule (100 mg total) by mouth at bedtime. 30 capsule 3  . hydrochlorothiazide (HYDRODIURIL) 12.5 MG tablet Take 1 tablet (12.5 mg total) by mouth daily. 90 tablet 3  . metoprolol tartrate (LOPRESSOR) 25 MG tablet TAKE ONE TABLET BY MOUTH TWICE DAILY 60 tablet 0  . Multiple Vitamin (MULTIVITAMIN) capsule Take 1 capsule by mouth daily.    Marland Kitchen omeprazole (PRILOSEC) 20 MG capsule Take 1 capsule (  20 mg total) by mouth daily. 30 capsule 5  . polyethylene glycol powder (GLYCOLAX/MIRALAX) powder Take 17 g by mouth daily. Until daily soft stools 3350 g 1  . pregabalin (LYRICA) 50 MG capsule Take 1 capsule (50 mg total) by mouth at bedtime. (Patient not taking: Reported on 01/14/2016) 30 capsule 2  . SYMBICORT 160-4.5 MCG/ACT inhaler INHALE TWO PUFFS BY MOUTH INTO THE LUNGS TWICE DAILY 1 Inhaler 0   No current facility-administered medications on file prior to visit.    Allergies  Allergen Reactions  . Codeine Other (See Comments)    Avoids-felt bad when took before    Social History    Social History  . Marital Status: Single    Spouse Name: N/A  . Number of Children: N/A  . Years of Education: N/A   Social History Main Topics  . Smoking status: Former Smoker -- 1.00 packs/day for 20 years    Quit date: 11/14/2003  . Smokeless tobacco: Never Used  . Alcohol Use: No  . Drug Use: No  . Sexual Activity: Not Asked   Other Topics Concern  . None   Social History Narrative   Lives with mother and his 3 children.  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 Hyperlipidemia Mother   . Asthma Mother   . Lupus Mother   . Hypertension Father   . Hyperlipidemia Father   . Hypertension Sister   . Hypertension Brother   . Cancer Neg Hx   . Diabetes Neg Hx   . Coronary artery disease Neg Hx   . Breast cancer Mother 87    ROS  Per HPI  Objective   BP 150/82 mmHg  Pulse 94  Temp(Src) 98.7 F (37.1 C) (Oral)  Ht 5\' 5"  (1.651 m)  Wt 175 lb 9.6 oz (79.652 kg)  BMI 29.22 kg/m2  General: Well appearing, no distress HEENT:   Head: Normocephalic  Eyes: Pupils equal and reactive to light/accomodation. Extraocular movements intact bilaterally.  Ears: Tympanic membranes normal bilaterally.  Nose/Throat: Nares patent bilaterally. Oropharnx clear and moist.  Neck: No cervical adenopathy bilaterally Respiratory/Chest: Clear to auscultation bilaterally. Unlabored work of breathing. No wheezing or rales. Cardiovascular: Regular rate and rhythm. Normal S1 and S2. No heart murmurs present. No extra heart sounds Gastrointestinal: Soft, non-tender, non-distended, no guarding, no rebound, no masses felt Genitourinary: Not examined    Musculoskeletal: No edema Neuro: Alert, oriented, CN intact, Reflexes symmetrical.  Dermatologic: some seborrheic keratoses Psychiatric: Full affect  Assessment and Plan    Meds ordered this encounter  Medications  . metoprolol tartrate (LOPRESSOR) 25 MG tablet    Sig: Take 1  tablet (25 mg total) by mouth 2 (two) times daily.    Dispense:  60 tablet    Refill:  11    Health Maintenance Due  Topic Date Due  . Hepatitis C Screening  Mar 07, 1961    Labs: Orders Placed This Encounter  Procedures  . Lipid panel    Standing Status: Future     Number of Occurrences:      Standing Expiration Date: 05/10/2017  . TSH    Standing Status: Future     Number of Occurrences:      Standing Expiration Date: 05/11/2017  . HIV antibody    Standing Status: Future     Number of Occurrences:      Standing Expiration Date: 05/11/2017  .  Hepatitis C antibody    Standing Status: Future     Number of Occurrences:      Standing Expiration Date: 05/11/2017   Overweight (BMI 25.0-29.9) Discussed weight loss methods, including increased exercise and diet modifications. Patient will try exercise modifications for now. Will follow-up on progress. She is comfortable with her weight, however, advised that although she may "look sickly" at closer to her ideal weight, she will not actually be sick.

## 2016-05-10 NOTE — Patient Instructions (Signed)
Thank you for coming to see me today. It was a pleasure. Today we talked about:   I want you to get your lipid panel next week. Make sure it is fasting.  Please make an appointment to see your new doctor in three to six months.  If you have any questions or concerns, please do not hesitate to call the office at [phone].  Sincerely,  Cordelia Poche, MD

## 2016-05-11 ENCOUNTER — Encounter: Payer: Self-pay | Admitting: *Deleted

## 2016-05-11 ENCOUNTER — Ambulatory Visit (INDEPENDENT_AMBULATORY_CARE_PROVIDER_SITE_OTHER): Payer: BLUE CROSS/BLUE SHIELD | Admitting: Internal Medicine

## 2016-05-11 ENCOUNTER — Encounter (INDEPENDENT_AMBULATORY_CARE_PROVIDER_SITE_OTHER): Payer: Self-pay

## 2016-05-11 ENCOUNTER — Encounter: Payer: Self-pay | Admitting: Internal Medicine

## 2016-05-11 VITALS — BP 112/70 | HR 74 | Ht 65.0 in | Wt 177.4 lb

## 2016-05-11 DIAGNOSIS — E663 Overweight: Secondary | ICD-10-CM | POA: Insufficient documentation

## 2016-05-11 DIAGNOSIS — J449 Chronic obstructive pulmonary disease, unspecified: Secondary | ICD-10-CM | POA: Diagnosis not present

## 2016-05-11 HISTORY — DX: Overweight: E66.3

## 2016-05-11 MED ORDER — BUDESONIDE-FORMOTEROL FUMARATE 160-4.5 MCG/ACT IN AERO: INHALATION_SPRAY | RESPIRATORY_TRACT | Status: DC

## 2016-05-11 MED ORDER — ALBUTEROL SULFATE HFA 108 (90 BASE) MCG/ACT IN AERS
2.0000 | INHALATION_SPRAY | Freq: Four times a day (QID) | RESPIRATORY_TRACT | Status: DC | PRN
Start: 2016-05-11 — End: 2017-09-03

## 2016-05-11 NOTE — Progress Notes (Signed)
Subjective:    Patient ID: [name], female   DOB: 06-30-1961    MRN: [id number]    Brief patient profile:  20 yobf quit smoking 10/2004 dx by transbronchial biopsy 5/92 with NCG inflammatory change consistent with sarcoid. Since that time she's been off and on prednisone multiple  times with symptoms of coughing dyspnea and skin involvement > weaned off chronic prednisone Feb 2012.   Overall she's been doing much better since she stopped smoking 10/2004 and much better since change to Symbicort 160/4.5 2 two times a day, able to do adl's ok without limitations including some steps. Documented GOLD III copd  07/2011     History of Present Illness 04/23/2014 f/u ov/Wert re: cough since late May 2015  Chief Complaint  Patient presents with  . Follow-up    Pt reports cough only minimally improved since eval here on 04/13/14. Sometimes coughs until develops HA and vomiting.    Worse at hs and in am ,states already taking omeprazole 20 mg bid ac (though very hesitant with answers re meds and does not have med calendar with action plan as rec previously  rx doxy/ pred and "no better" but no purulent sputum at all now/ actually very little mucus production unless vomits from coughng Never took hycodan Not limited by breathing from desired activities   rec Go ahead and take your cough medication to completely suppress your cough Prednisone 10 mg take  4 each am x 2 days,   2 each am x 2 days,  1 each am x 2 days and stop  Add pepcid ac 20 mg at bedtime and chlortrimeton 4 mg (over the counter) x 2 at bedtime until cough is 100% gone x 2 weeks GERD  Please see patient coordinator before you leave today  to schedule sinus CT> not done   I f not better return in 4 weeks with all meds and med calendar   08/21/2014 f/u ov/Wert re:  GOLD III copd / no med calendar  Chief Complaint  Patient presents with  . Follow-up    Pt states having increased DOE just since this am. She was SOB walking from her  car to our building today.     did not use saba on day of ov/ saba was clogged up  > rec Pulm rehab   11/27/2014 NP  Follow up GOLD III copd/Sarcoid  Patient returns for a three-month follow-up for COPD Overall, she says she's been doing okay. Has noticed that she's been having on and off wheezing with cold air, but seems to be improving. She remains on Symbicort 2 puffs twice daily No increased use of her rescue inhaler rec No change rx   05/12/2015 f/u ov/Wert re: copd III / symbicort 160 2bid  Chief Complaint  Patient presents with  . Follow-up    Pt states her breathing is doing well overall. She has not needed her rescue inhaler.    Not limited by breathing from desired activities   rec No change in medications Try to work up to a minimum of 30 minutes without stopping 3 x weekly on your bike    05/11/2016  f/u ov/Wert re: GOLD III / symb 160 2bid  Chief Complaint  Patient presents with  . Follow-up    Breathing is overall doing well and she rarely uses rescue inhaler. Needs refills on Symbicort.   working out at the gym but not much aerobics   No tendency to aecopd  No obvious day to day or daytime variabilty or assoc excess/ purulent sputum or mucus plugs  or cp or chest tightness, subjective wheeze overt sinus or hb symptoms. No unusual exp hx or h/o childhood pna/ asthma or knowledge of premature birth.  Sleeping ok without nocturnal  or early am exacerbation  of respiratory  c/o's or need for noct saba. Also denies any obvious fluctuation of symptoms with weather or environmental changes or other aggravating or alleviating factors except as outlined above   Current Medications, Allergies, Complete Past Medical History, Past Surgical History, Family History, and Social History were reviewed in [employer] record.  ROS  The following are not active complaints unless bolded sore throat, dysphagia, dental problems, itching, sneezing,  nasal  congestion or excess/ purulent secretions, ear ache,   fever, chills, sweats, unintended wt loss, pleuritic or exertional cp, hemoptysis,  orthopnea pnd or leg swelling, presyncope, palpitations, abdominal pain, anorexia, nausea, vomiting, diarrhea  or change in bowel or urinary habits, change in stools or urine, dysuria,hematuria,  rash, arthralgias, visual complaints, headache, numbness weakness or ataxia or problems with walking or coordination,  change in mood/affect or memory.                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 Unice Cobble  -h/o Daily prednisone since 2007   > off completely Feb 2012 with no problem ? 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 (ICD-280.9)  Health Maintenance.......................................................   [facility]   Family History:  crohn`s in daughter, M-lupus, htn, asthma,  no Ca, DM, CAD    Social History:   lives with twin children and grandson. single; >20 pack year history, quit in 2005; no EtOH  Laid off  from works for [employer] Sept 2013              Objective:   Physical Exam wt 166 Oct 12, 2009 >170 October 28, 2010 > 169 06/29/2011 >>171 08/24/2011 > 01/15/2012  171 > 04/24/2012 176 > 06/28/2012  175> 08/13/2012 > 08/30/2012  173 > 170 11/28/2012 > 01/29/2013   176 > 174 >173 06/16/2013 > 171 09/18/2013 > 12/19/2013 177 >179 04/13/2014>  04/23/2014 177 > 08/21/2014 184 >182 11/27/2014 > 05/12/2015 177  > 05/11/2016   177   amb bf nad / vital signs reviewed   HEENT: nl dentition, turbinates, and oropharynx. Nl external ear canals without cough reflex   NECK :  without JVD/Nodes/TM/ nl carotid upstrokes bilaterally   LUNGS: no acc muscle use, clear to A and P bilaterally without cough on insp or exp maneuvers   CV:  RRR  no s3 or murmur or increase in P2, no edema   ABD:  soft and nontender with nl excursion in the supine position. No bruits or organomegaly, bowel sounds nl  MS:  warm without deformities, calf  tenderness, cyanosis or clubbing  SKIN: warm and dry without lesions    NEURO:  alert, approp, no deficits       .        Assessment:

## 2016-05-11 NOTE — Patient Instructions (Signed)
No change in medications   Please schedule a follow up visit in 12  months but call sooner if needed  

## 2016-05-11 NOTE — Assessment & Plan Note (Signed)
Quit smoking 2005  - PFT's 06/26/08 FEV1 1.09 ratio  - PFTs 03/02/10 FEV1 1.06 ratio 34    PFT's 08/01/2011 FEV1 1.6 (42%)  41 ratio  DLCO 55 corrects 77 - Spiriva trial March 02, 2010 > ? better but not worse off it 04/2011 - 05/12/2015 p extensive coaching HFA effectiveness =    90%  - Referred to Rehab 08/21/2014 > could not arrange due to schedule  -med calendar 06/16/2013 > did not bring to office as requested 12/19/13 or 04/23/14  Or 08/21/2014  - 05/11/2016  After extensive coaching HFA effectiveness =    90%  Despite moderately severe airflow obst she's well compensated on just symbicort 2 bid with good hfa technique and no tendency to flares so no change in rx and f/u can be yearly  I had an extended discussion with the patient reviewing all relevant studies completed to date and  lasting 15 to 20 minutes of a 25 minute visit    Each maintenance medication was reviewed in detail including most importantly the difference between maintenance and prns and under what circumstances the prns are to be triggered using an action plan format that is not reflected in the computer generated alphabetically organized AVS.    Please see instructions for details which were reviewed in writing and the patient given a copy highlighting the part that I personally wrote and discussed at today's ov.

## 2016-05-11 NOTE — Assessment & Plan Note (Signed)
Discussed weight loss methods, including increased exercise and diet modifications. Patient will try exercise modifications for now. Will follow-up on progress. She is comfortable with her weight, however, advised that although she may "look sickly" at closer to her ideal weight, she will not actually be sick.

## 2016-06-26 DIAGNOSIS — H50112 Monocular exotropia, left eye: Secondary | ICD-10-CM | POA: Diagnosis not present

## 2016-06-26 DIAGNOSIS — H524 Presbyopia: Secondary | ICD-10-CM | POA: Diagnosis not present

## 2016-06-26 DIAGNOSIS — H179 Unspecified corneal scar and opacity: Secondary | ICD-10-CM | POA: Diagnosis not present

## 2016-07-05 NOTE — Telephone Encounter (Signed)
error 

## 2016-07-19 ENCOUNTER — Encounter: Payer: Self-pay | Admitting: Family Medicine

## 2016-07-19 ENCOUNTER — Ambulatory Visit (INDEPENDENT_AMBULATORY_CARE_PROVIDER_SITE_OTHER): Payer: BLUE CROSS/BLUE SHIELD | Admitting: Family Medicine

## 2016-07-19 VITALS — BP 159/71 | HR 63 | Temp 98.4°F | Wt 180.0 lb

## 2016-07-19 DIAGNOSIS — M7542 Impingement syndrome of left shoulder: Secondary | ICD-10-CM

## 2016-07-19 MED ORDER — DICLOFENAC SODIUM 75 MG PO TBEC: 75.0000 mg | DELAYED_RELEASE_TABLET | Freq: Two times a day (BID) | ORAL | 0 refills | Status: DC

## 2016-07-19 NOTE — Assessment & Plan Note (Addendum)
Apparently noted in 2016 but at that time was bilateral.  Today's exam notable for weakness of supraspinatus, +empty can and +crossover testing, suggestive of impingement.  No neurologic findings.  Patient to STOP Motrin and start Voltaren for the next couple of weeks.  If no improvement with at home exercises and oral medication, consider ultrasound +/- injection at [facility].  Information for [facility] given.  Return precautions reviewed.  Work note for today provided. Follow up as needed if no improvement.

## 2016-07-19 NOTE — Progress Notes (Signed)
   Subjective: CC: left shoulder [id number] [name] is Erin 55 y.o. female presenting to clinic today for same day appointment. PCP: Melina Schools, DO Concerns today include:  1. Left shoulder Patient reports that pain has been present along the rotator cuff for about 2-3 weeks.  This morning she woke up and was unable to raise her left arm up without significant pain.  She reports Erin constant aching pain that is worse with activity.  She reports she has been trying to keep shoulder mobile.  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 She uses her Left arm mostly for work.  She is right hand dominant.  No numbness or tingling.  No weakness.  No h/o injury to that shoulder.  She reports that she took Erin Motrin 600mg  without any relief this am.  Rubbed some biofreeze on it but this also did not help.  Does not wake her from sleep but she notes that it does hurt for her to sleep on it.  Social History Reviewed. FamHx and MedHx reviewed.  Please see EMR.  ROS: Per HPI  Objective: Office vital signs reviewed. BP (!) 159/71   Pulse 63   Temp 98.4 F (36.9 C) (Oral)   Wt 180 lb (81.6 kg)   SpO2 96%   BMI 29.95 kg/m   Physical Examination:  General: Awake, alert, well nourished, No acute distress Extremities: warm, well perfused, No edema, cyanosis or clubbing; +2 radial pulses bilaterally MSK: patient with full AROM (but pain noted at 160 degrees aBduction), 4/5 with subscapularis testing, otherwise 5/5 strength of bilateral UE.  +empty can, +crossover test Skin: dry, intact, no rashes or lesions Neuro: light touch sensation grossly intact  Assessment/ Plan: 55 y.o. female   Subacromial impingement of left shoulder Apparently noted in 2016 but at that time was bilateral.  Today's exam notable for weakness of supraspinatus, +empty can and +crossover testing, suggestive of impingement.  No neurologic findings.  Patient to STOP Motrin and start Voltaren for the next couple of weeks.  If no  improvement with at home exercises and oral medication, consider ultrasound +/- injection at [facility].  Information for [facility] given.  Return precautions reviewed.  Work note for today provided. Follow up as needed if no improvement.  Janora Norlander, DO PGY-3, [facility]

## 2016-07-19 NOTE — Patient Instructions (Signed)
I have provided you with Voltaren tablets to take twice daily with food and exercises to perform at home.  If things are not getting better in the next 10 days, consider calling [facility] for an appointment (Address: [address], [place], [place] [address] Phone: [phone]).

## 2016-08-03 ENCOUNTER — Ambulatory Visit (HOSPITAL_BASED_OUTPATIENT_CLINIC_OR_DEPARTMENT_OTHER): Payer: BLUE CROSS/BLUE SHIELD | Admitting: Hematology and Oncology

## 2016-08-03 ENCOUNTER — Encounter: Payer: Self-pay | Admitting: Hematology and Oncology

## 2016-08-03 DIAGNOSIS — Z79811 Long term (current) use of aromatase inhibitors: Secondary | ICD-10-CM | POA: Diagnosis not present

## 2016-08-03 DIAGNOSIS — C50411 Malignant neoplasm of upper-outer quadrant of right female breast: Secondary | ICD-10-CM | POA: Diagnosis not present

## 2016-08-03 NOTE — Assessment & Plan Note (Signed)
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 On Neurontin 100 mg at bedtime.  Breast Cancer Surveillance: 1. Breast exam 08/03/2016: Normal 2. Mammogram due this month    Return to clinic in 6 months for toxicity check and follow up.

## 2016-08-03 NOTE — Progress Notes (Signed)
Patient Care Team: Nicolette Bang, DO as PCP - General (Family Medicine) Nicholas Lose, MD as Consulting Physician (Hematology and Oncology) Eppie Gibson, MD as Attending Physician (Radiation Oncology) Stark Klein, MD as Consulting Physician (General Surgery) Sylvan Cheese, NP as Nurse Practitioner (Hematology and Oncology)  DIAGNOSIS: Breast cancer of upper-outer quadrant of right female breast [facility])   Staging form: Breast, AJCC 7th Edition   - Clinical stage from 08/04/2015: Stage IA (T1b, N0, M0) - Unsigned   - Pathologic stage from 08/18/2015: Stage IA (T1b, N0, cM0) - Unsigned  SUMMARY OF ONCOLOGIC HISTORY:   Breast cancer of upper-outer quadrant of right female breast ([place])   07/08/2015 Mammogram    Right breast calcifications 11 mm span      08/04/2015 Initial Diagnosis    Biopsy right breast: Invasive ductal carcinoma with DCIS with calcifications, lymphovascular invasion is identified, grade 2-3, ER 100%, PR 100% Ki-67 20%, HER-2 negative ratio 1.46      08/04/2015 Clinical Stage    Stage IA: T1b N0      08/18/2015 Definitive Surgery    Right  lumpectomy: invasive ductal carcinoma, grade 2, 0.6 cm, with DCIS high-grade, margins negative, 0/2 lymph nodes negative, ER 100%, PR positive, HER-2 negative, Ki-67 20%      08/18/2015 Pathologic Stage    Stage IA: T1b N0      08/18/2015 Oncotype testing    Insufficient tissue to perform      09/21/2015 - 10/20/2015 Radiation Therapy    Adjuvant radiation therapy: 1) Right Breast / 40.05 Gy in 15 fractions 2) Right breast boost / 10 Gy in 5 fractions      10/08/2015 - 10/11/2015 Hospital Admission    cellulitis of the right breast      11/16/2015 -  Anti-estrogen oral therapy    Anastrozole 1 mg daily once hot flashes better controlled. Planned duration of therapy 5 years.        CHIEF COMPLIANT: Profound hot flashes  INTERVAL HISTORY: [name] is a 55 year old with above-mentioned history of  right breast cancer treated with lumpectomy followed by radiation and is currently on anastrozole. She has persistent hot flashes and night sweats. This is been affecting her quality of life. She had previously tried Effexor which made her drowsy and confused. She is currently taking gabapentin at bedtime. She tells me that he does not do a whole lot. She is complaining of pain in the left arm and has an appointment with her PCP. Her mother recently passed with metastatic breast cancer. She is extremely emotional and sad. She complains of left shoulder pain.  REVIEW OF SYSTEMS:   Constitutional: Denies fevers, chills or abnormal weight loss Eyes: Denies blurriness of vision Ears, nose, mouth, throat, and face: Denies mucositis or sore throat Respiratory: Denies cough, dyspnea or wheezes Cardiovascular: Denies palpitation, chest discomfort Gastrointestinal:  Denies nausea, heartburn or change in bowel habits Skin: Denies abnormal skin rashes Lymphatics: Denies new lymphadenopathy or easy bruising Neurological:Denies numbness, tingling or new weaknesses Behavioral/Psych: Mood is stable, no new changes  Extremities: No lower extremity edema Breast:  denies any pain or lumps or nodules in either breasts All other systems were reviewed with the patient and are negative.  I have reviewed the past medical history, past surgical history, social history and family history with the patient and they are unchanged from previous note.  ALLERGIES:  is allergic to codeine.  MEDICATIONS:  Current Outpatient Prescriptions  Medication Sig Dispense Refill  .  acetaminophen (TYLENOL) 500 MG tablet Take 1,000 mg by mouth every 6 (six) hours as needed for fever. Reported on 05/10/2016    . albuterol (PROVENTIL HFA;VENTOLIN HFA) 108 (90 Base) MCG/ACT inhaler Inhale 2 puffs into the lungs every 6 (six) hours as needed for wheezing or shortness of breath. 1 Inhaler 5  . anastrozole (ARIMIDEX) 1 MG tablet Take 1  tablet (1 mg total) by mouth daily. 90 tablet 3  . aspirin EC 81 MG EC tablet Take 1 tablet (81 mg total) by mouth daily. 30 tablet 11  . budesonide-formoterol (SYMBICORT) 160-4.5 MCG/ACT inhaler Take 2 puffs first thing in am and then another 2 puffs about 12 hours later. 1 Inhaler 11  . diclofenac (VOLTAREN) 75 MG EC tablet Take 1 tablet (75 mg total) by mouth 2 (two) times daily. 30 tablet 0  . fluticasone (FLONASE) 50 MCG/ACT nasal spray Place 2 sprays into both nostrils daily as needed for allergies. 16 g 11  . gabapentin (NEURONTIN) 100 MG capsule Take 1 capsule (100 mg total) by mouth at bedtime. 30 capsule 3  . hydrochlorothiazide (HYDRODIURIL) 12.5 MG tablet Take 1 tablet (12.5 mg total) by mouth daily. 90 tablet 3  . metoprolol tartrate (LOPRESSOR) 25 MG tablet Take 1 tablet (25 mg total) by mouth 2 (two) times daily. 60 tablet 11  . Multiple Vitamin (MULTIVITAMIN) capsule Take 1 capsule by mouth daily.    Marland Kitchen omeprazole (PRILOSEC) 20 MG capsule Take 1 capsule (20 mg total) by mouth daily. 30 capsule 5   No current facility-administered medications for this visit.     PHYSICAL EXAMINATION: ECOG PERFORMANCE STATUS: 1 - Symptomatic but completely ambulatory  Vitals:   08/03/16 1521  BP: (!) 185/97  Pulse: 77  Resp: 18  Temp: 99.1 F (37.3 C)   Filed Weights   08/03/16 1521  Weight: 177 lb 9.6 oz (80.6 kg)    GENERAL:alert, no distress and comfortable SKIN: skin color, texture, turgor are normal, no rashes or significant lesions EYES: normal, Conjunctiva are pink and non-injected, sclera clear OROPHARYNX:no exudate, no erythema and lips, buccal mucosa, and tongue normal  NECK: supple, thyroid normal size, non-tender, without nodularity LYMPH:  no palpable lymphadenopathy in the cervical, axillary or inguinal LUNGS: clear to auscultation and percussion with normal breathing effort HEART: regular rate & rhythm and no murmurs and no lower extremity edema ABDOMEN:abdomen soft,  non-tender and normal bowel sounds MUSCULOSKELETAL:no cyanosis of digits and no clubbing  NEURO: alert & oriented x 3 with fluent speech, no focal motor/sensory deficits EXTREMITIES: No lower extremity edema  LABORATORY DATA:  I have reviewed the data as listed   Chemistry      Component Value Date/Time   NA 140 10/11/2015 0752   K 3.5 10/11/2015 0752   CL 103 10/11/2015 0752   CO2 31 10/11/2015 0752   BUN <5 (L) 10/11/2015 0752   CREATININE 0.56 10/11/2015 0752   CREATININE 0.64 02/04/2015 1619      Component Value Date/Time   CALCIUM 9.0 10/11/2015 0752   ALKPHOS 72 10/08/2015 1121   AST 18 10/08/2015 1121   ALT 12 (L) 10/08/2015 1121   BILITOT 0.5 10/08/2015 1121       Lab Results  Component Value Date   WBC 8.0 10/11/2015   HGB 11.4 (L) 10/11/2015   HCT 33.5 (L) 10/11/2015   MCV 79.2 10/11/2015   PLT 271 10/11/2015   NEUTROABS 15.4 (H) 10/08/2015     ASSESSMENT & PLAN:  Breast cancer of  upper-outer quadrant of right female breast ([place]) Right breast invasive ductal carcinoma status post lumpectomy on 08/18/2015, grade 2, 0.6 cm, with DCIS high-grade, margins negative, 0/2 lymph nodes negative, ER 100%, PR positive, HER-2 negative, Ki-67 20%, T1bN0 stage IA,  Adjuvant radiation from 09/21/2015 to 10/20/2015  Current treatment: Adjuvant antiestrogen therapy with anastrozole 1 mg daily 5 years started January 2017 Severe hot flashes: Effexor XR was not working. On Neurontin 100 mg at bedtime. I discussed with her about using essential oils to help decrease the hot flashes.  Breast Cancer Surveillance: 1. Breast exam 08/03/2016: Normal 2. Mammogram due this month: I sent an order today.    Return to clinic in 6 months for follow up and after that we can see her once a year..   Orders Placed This Encounter  Procedures  . MM DIAG BREAST TOMO BILATERAL    EPIC ORDER PF: 07/07/2015 [place]  NO NEEDS  CR/VONTA  BCBS  HX BR CA    Standing Status:   Future     Standing Expiration Date:   10/03/2017    Order Specific Question:   Reason for Exam (SYMPTOM  OR DIAGNOSIS REQUIRED)    Answer:   Annual follow up of breast cancer    Order Specific Question:   Is the patient pregnant?    Answer:   No    Order Specific Question:   Preferred imaging location?    Answer:   [facility]   The patient has a good understanding of the overall plan. she agrees with it. she will call with any problems that may develop before the next visit here.   Rulon Eisenmenger, MD 08/03/16

## 2016-08-07 ENCOUNTER — Ambulatory Visit (INDEPENDENT_AMBULATORY_CARE_PROVIDER_SITE_OTHER): Payer: BLUE CROSS/BLUE SHIELD | Admitting: Internal Medicine

## 2016-08-07 ENCOUNTER — Encounter: Payer: Self-pay | Admitting: Internal Medicine

## 2016-08-07 DIAGNOSIS — F418 Other specified anxiety disorders: Secondary | ICD-10-CM | POA: Insufficient documentation

## 2016-08-07 MED ORDER — ZOLPIDEM TARTRATE 5 MG PO TABS: 5.0000 mg | ORAL_TABLET | Freq: Every evening | ORAL | 1 refills | Status: DC | PRN

## 2016-08-07 MED ORDER — SERTRALINE HCL 50 MG PO TABS: 50.0000 mg | ORAL_TABLET | Freq: Every day | ORAL | 0 refills | Status: DC

## 2016-08-07 NOTE — Progress Notes (Signed)
Subjective:    [name] - 55 y.o. female MRN [id number]  Date of birth: 08/07/1961  HPI  [name] is here for depressed mood.  Presenting Issue: Concern for depression. Reports that her mother passed away 2 weeks ago from terminal cancer. Says although it was expected, she is still having difficulty with dealing with the grief. Also reports feelings of hopelessness, feeling down and  feeling tired with little energy since the patient herself was diagnosed with cancer about one year ago. These symptoms have worsened and she has also had additional problems with sleeping and poor appetite since the passing of her mother. She has taken a friend's Ativan 3 times in the past few days to help with anxiety and to help her sleep. Had what she believes to be a panic attack (symptoms of SOB and intense fear) on Friday. Has never had a panic attack prior.   Duration of CURRENT symptoms:One year, acutely worsened in the past 2 weeks  Age of onset of first mood disturbance: Reports some symptoms of depression/anxiety as a teenager  Impact on function: Moderate. Difficulty with functioning due to lack of sleep especially.   Psychiatric History - Diagnoses:None  - Hospitalizations:  None  - Pharmacotherapy: None - Outpatient therapy: None   Family history of psychiatric issues:Granddaughter with bipolar   Current and history of substance [id number] drug use and EtOH use.   PHQ-9:10 MDQ:0 [id number]   -  reports that she quit smoking about 12 years ago. She has a 20.00 pack-year smoking history. She has never used smokeless tobacco. - Review of Systems: Per HPI. - Past Medical History: Patient Active Problem List   Diagnosis Date Noted  . Depression with anxiety 08/07/2016  . Subacromial impingement of left shoulder 07/19/2016  . Overweight (BMI 25.0-29.9) 05/11/2016  . Hot flashes 02/01/2016  . Constipation 01/13/2016  . Cellulitis of breast   . Sepsis ([place])   . Hypokalemia   .  Cellulitis 10/08/2015  . Breast cancer of upper-outer quadrant of right female breast ([place]) 08/04/2015  . Tibial pain 06/03/2015  . Rotator cuff impingement syndrome 09/14/2014  . Menopausal syndrome (hot flashes) 01/19/2014  . GERD (gastroesophageal reflux disease) 12/21/2012  . Hyperlipidemia 12/21/2012  . SICKLE-CELL TRAIT 04/15/2008  . Sarcoidosis ([place]) 01/10/2007  . ANEMIA, IRON DEFICIENCY, UNSPEC. 01/10/2007  . HYPERTENSION, BENIGN SYSTEMIC 01/10/2007  . COPD  GOLD III 01/10/2007   - Medications: reviewed and updated    Objective:   Physical Exam BP (!) 151/90   Pulse 83   Temp 98.2 F (36.8 C) (Oral)   Ht 5\' 5"  (1.651 m)   Wt 176 lb 12.8 oz (80.2 kg)   BMI 29.42 kg/m  Gen: NAD, alert, cooperative with exam CV: RRR, good S1/S2 Psych: tearful at times, appropriate affect given situation, responds to questions appropriately        Assessment & Plan:   Depression with anxiety Suspect an acute on chronic presentation with worsening of symptoms given grief over recent loss of her mother. Patient appropriate for SSRI therapy given elevated PHQ-9 and GAD-7 with report of long-term symptoms. Discussed that Ativan is not a long term solution for her mood disturbances. Recommended appointment with integrated care clinic, which patient was agreeable to. Along with CBT, will start patient on Zoloft 50 mg. Patient to return in 1-2 weeks for follow up and increase of dose if needed. Patient has Melatonin at home which she has not yet tried. She wishes to try  this first. Have also given Ambien 5 mg #20 for short term treatment of insomnia during acute grief period if trial of Melatonin is not helpful with symptoms.    Phill Myron, D.O. 08/07/2016, 4:36 PM PGY-2, [place]

## 2016-08-07 NOTE — Assessment & Plan Note (Signed)
Suspect an acute on chronic presentation with worsening of symptoms given grief over recent loss of her mother. Patient appropriate for SSRI therapy given elevated PHQ-9 and GAD-7 with report of long-term symptoms. Discussed that Ativan is not a long term solution for her mood disturbances. Recommended appointment with integrated care clinic, which patient was agreeable to. Along with CBT, will start patient on Zoloft 50 mg. Patient to return in 1-2 weeks for follow up and increase of dose if needed. Patient has Melatonin at home which she has not yet tried. She wishes to try this first. Have also given Ambien 5 mg #20 for short term treatment of insomnia during acute grief period if trial of Melatonin is not helpful with symptoms.

## 2016-08-07 NOTE — Patient Instructions (Signed)
I am so sorry about the recent loss of your mother. That is very difficult to deal with. I would recommend making an appointment with the [facility] providers as you leave today.   I have prescribed you Zoloft to help with feelings of depression. If you have worsening of your mood or thoughts of suicide please call the clinic immediately.   I have also given you a short term prescription for Ambien to help you get some sleep at night. This is not a long term medication but can help until we get your anti-depressant at the correct dose and get you set up with counseling services during this time of grief.   Please schedule an appointment with Dr. Juleen China in the next 1-2 weeks for follow up.

## 2016-08-10 ENCOUNTER — Ambulatory Visit (INDEPENDENT_AMBULATORY_CARE_PROVIDER_SITE_OTHER): Payer: BLUE CROSS/BLUE SHIELD | Admitting: *Deleted

## 2016-08-10 ENCOUNTER — Ambulatory Visit
Admission: RE | Admit: 2016-08-10 | Discharge: 2016-08-10 | Disposition: A | Discharge status: Home / Self Care | Payer: BLUE CROSS/BLUE SHIELD | Source: Ambulatory Visit | Attending: Hematology and Oncology | Admitting: Hematology and Oncology

## 2016-08-10 DIAGNOSIS — Z23 Encounter for immunization: Secondary | ICD-10-CM

## 2016-08-10 DIAGNOSIS — C50411 Malignant neoplasm of upper-outer quadrant of right female breast: Secondary | ICD-10-CM

## 2016-08-10 DIAGNOSIS — R928 Other abnormal and inconclusive findings on diagnostic imaging of breast: Secondary | ICD-10-CM | POA: Diagnosis not present

## 2016-08-24 DIAGNOSIS — C50411 Malignant neoplasm of upper-outer quadrant of right female breast: Secondary | ICD-10-CM | POA: Diagnosis not present

## 2016-08-28 ENCOUNTER — Other Ambulatory Visit: Payer: Self-pay | Admitting: Hematology and Oncology

## 2016-08-28 DIAGNOSIS — N951 Menopausal and female climacteric states: Secondary | ICD-10-CM

## 2016-09-05 ENCOUNTER — Ambulatory Visit (INDEPENDENT_AMBULATORY_CARE_PROVIDER_SITE_OTHER): Payer: BLUE CROSS/BLUE SHIELD | Admitting: Internal Medicine

## 2016-09-05 ENCOUNTER — Encounter: Payer: Self-pay | Admitting: Internal Medicine

## 2016-09-05 DIAGNOSIS — F418 Other specified anxiety disorders: Secondary | ICD-10-CM | POA: Diagnosis not present

## 2016-09-05 MED ORDER — SERTRALINE HCL 50 MG PO TABS: 50.0000 mg | ORAL_TABLET | Freq: Every day | ORAL | 3 refills | Status: DC

## 2016-09-05 NOTE — Assessment & Plan Note (Signed)
Marked improvement of symptoms. PHQ-9 score improved from 10 to 3. GAD-7 score improved from 15 to 2.  -continue Zoloft 50 mg daily  -patient to alert PCP for any worsening of mood or SI  -discussed that behavioral health is always a good option for adjunctive therapy if she decides she wants to see them

## 2016-09-05 NOTE — Progress Notes (Signed)
   Subjective:    [name] - 55 y.o. female MRN [id number]  Date of birth: 27-Jan-1961  HPI  [name] is here for follow up for depression.  Depression: Has been taking Zoloft 50 mg daily for about 3 weeks. Reports improvement in her mood. No longer having difficulty sleeping. Has taken more interest in spending time with family and doing the things she used to take enjoyment in. Appetite is also starting to return. She did not see behavioral health since her last visit. States she plans to meet with the hospice coordinator who was involved in her late mother's care. She believes this will help give her some comfort and closure. Denies SI and HI. PHQ-9 score of 3 and GAD-7 score of 2.    Health Maintenance Due  Topic Date Due  . Hepatitis C Screening  [birth year]    -  reports that she quit smoking about 12 years ago. She has a 20.00 pack-year smoking history. She has never used smokeless tobacco. - Review of Systems: Per HPI. - Past Medical History: Patient Active Problem List   Diagnosis Date Noted  . Depression with anxiety 08/07/2016  . Subacromial impingement of left shoulder 07/19/2016  . Overweight (BMI 25.0-29.9) 05/11/2016  . Hot flashes 02/01/2016  . Constipation 01/13/2016  . Cellulitis of breast   . Sepsis ([place])   . Hypokalemia   . Cellulitis 10/08/2015  . Breast cancer of upper-outer quadrant of right female breast ([place]) 08/04/2015  . Tibial pain 06/03/2015  . Rotator cuff impingement syndrome 09/14/2014  . Menopausal syndrome (hot flashes) 01/19/2014  . GERD (gastroesophageal reflux disease) 12/21/2012  . Hyperlipidemia 12/21/2012  . SICKLE-CELL TRAIT 04/15/2008  . Sarcoidosis ([place]) 01/10/2007  . ANEMIA, IRON DEFICIENCY, UNSPEC. 01/10/2007  . HYPERTENSION, BENIGN SYSTEMIC 01/10/2007  . COPD  GOLD III 01/10/2007   - Medications: reviewed and updated    Objective:   Physical Exam BP 130/80   Pulse 65   Temp 98.3 F (36.8 C) (Oral)   Ht 5\' 5"   (1.651 m)   Wt 174 lb (78.9 kg)   BMI 28.96 kg/m  Gen: NAD, alert, cooperative with exam, well-appearing Psych: good insight, alert and oriented, appropriate affect      Assessment & Plan:   Depression with anxiety Marked improvement of symptoms. PHQ-9 score improved from 10 to 3. GAD-7 score improved from 15 to 2.  -continue Zoloft 50 mg daily  -patient to alert PCP for any worsening of mood or SI  -discussed that behavioral health is always a good option for adjunctive therapy if she decides she wants to see them     Phill Myron, D.O. 09/05/2016, 4:41 PM PGY-2, [place]

## 2016-09-14 ENCOUNTER — Ambulatory Visit: Payer: BLUE CROSS/BLUE SHIELD | Attending: General Surgery | Admitting: Occupational Therapy

## 2016-09-21 ENCOUNTER — Encounter: Payer: Self-pay | Admitting: Family Medicine

## 2016-09-21 ENCOUNTER — Ambulatory Visit (INDEPENDENT_AMBULATORY_CARE_PROVIDER_SITE_OTHER): Payer: BLUE CROSS/BLUE SHIELD | Admitting: Family Medicine

## 2016-09-21 VITALS — BP 146/82 | HR 90 | Temp 98.3°F | Wt 171.0 lb

## 2016-09-21 DIAGNOSIS — K219 Gastro-esophageal reflux disease without esophagitis: Secondary | ICD-10-CM | POA: Diagnosis not present

## 2016-09-21 DIAGNOSIS — J028 Acute pharyngitis due to other specified organisms: Secondary | ICD-10-CM

## 2016-09-21 DIAGNOSIS — F418 Other specified anxiety disorders: Secondary | ICD-10-CM

## 2016-09-21 DIAGNOSIS — B9789 Other viral agents as the cause of diseases classified elsewhere: Secondary | ICD-10-CM

## 2016-09-21 DIAGNOSIS — J029 Acute pharyngitis, unspecified: Secondary | ICD-10-CM

## 2016-09-21 MED ORDER — SERTRALINE HCL 50 MG PO TABS
50.0000 mg | ORAL_TABLET | Freq: Every day | ORAL | 3 refills | Status: DC
Start: 2016-09-21 — End: 2017-02-20

## 2016-09-21 MED ORDER — OMEPRAZOLE 20 MG PO CPDR: 20.0000 mg | DELAYED_RELEASE_CAPSULE | Freq: Every day | ORAL | 5 refills | Status: DC

## 2016-09-21 NOTE — Patient Instructions (Signed)

## 2016-09-21 NOTE — Progress Notes (Signed)
   Subjective:    Patient ID: [name] is a 55 y.o. female presenting with No chief complaint on file.  on 09/21/2016  HPI: 3 day h/o sore throat on the left side. Some associated ear pain, headache on left side. When swallows pain is worse. Notes knot on throat as well. Has noted recent weight loss of 9 pounds and loss of appetite. No sick contacts noted. Has had flu shot. Quit smoking 2005. Does note left eye running and runny nose. Some shoulder pain as well.  Review of Systems  Constitutional: Negative for chills and fever.  Respiratory: Negative for shortness of breath.   Cardiovascular: Negative for chest pain.  Gastrointestinal: Negative for abdominal pain, nausea and vomiting.  Genitourinary: Negative for dysuria.  Skin: Negative for rash.      Objective:    BP (!) 169/91   Pulse 90   Temp 98.3 F (36.8 C) (Oral)   Wt 171 lb (77.6 kg)   SpO2 95%   BMI 28.46 kg/m  Physical Exam  Constitutional: She is oriented to person, place, and time. She appears well-developed and well-nourished. No distress.  HENT:  Head: Normocephalic and atraumatic.  Mouth/Throat: No oropharyngeal exudate, posterior oropharyngeal edema or posterior oropharyngeal erythema.  Eyes: No scleral icterus.  Neck: Neck supple.  Possibly some mild swelling of the tracheal cartilage  Cardiovascular: Normal rate.   Pulmonary/Chest: Effort normal.  Abdominal: Soft.  Lymphadenopathy:    Cervical adenopathy: few shotty lymph nodes.  Neurological: She is alert and oriented to person, place, and time.  Skin: Skin is warm and dry.  Psychiatric: She has a normal mood and affect.        Assessment & Plan:   Problem List Items Addressed This Visit      Unprioritized   GERD (gastroesophageal reflux disease) - Primary    Continue Omeprazole      Relevant Medications   omeprazole (PRILOSEC) 20 MG capsule   Depression with anxiety    Continue Zoloft, rx given      Relevant Medications   sertraline (ZOLOFT) 50 MG tablet    Other Visit Diagnoses    Sore throat (viral)       Suspect PND      Total face-to-face time with patient: 15 minutes. Over 50% of encounter was spent on counseling and coordination of care. Return if symptoms worsen or fail to improve.  [name] 09/21/2016 3:50 PM

## 2016-09-22 NOTE — Assessment & Plan Note (Signed)
Continue Omeprazole ?

## 2016-09-22 NOTE — Assessment & Plan Note (Signed)
Continue Zoloft, rx given

## 2016-10-06 ENCOUNTER — Encounter (HOSPITAL_COMMUNITY): Payer: Self-pay | Admitting: Emergency Medicine

## 2016-10-06 ENCOUNTER — Emergency Department (HOSPITAL_COMMUNITY): Payer: BLUE CROSS/BLUE SHIELD

## 2016-10-06 ENCOUNTER — Inpatient Hospital Stay (HOSPITAL_COMMUNITY)
Admission: EM | Admit: 2016-10-06 | Discharge: 2016-10-10 | DRG: 190 | Disposition: A | Discharge status: Home / Self Care | Payer: BLUE CROSS/BLUE SHIELD | Attending: Internal Medicine | Admitting: Internal Medicine

## 2016-10-06 DIAGNOSIS — Z79899 Other long term (current) drug therapy: Secondary | ICD-10-CM

## 2016-10-06 DIAGNOSIS — R262 Difficulty in walking, not elsewhere classified: Secondary | ICD-10-CM

## 2016-10-06 DIAGNOSIS — E440 Moderate protein-calorie malnutrition: Secondary | ICD-10-CM | POA: Insufficient documentation

## 2016-10-06 DIAGNOSIS — Z87891 Personal history of nicotine dependence: Secondary | ICD-10-CM

## 2016-10-06 DIAGNOSIS — K219 Gastro-esophageal reflux disease without esophagitis: Secondary | ICD-10-CM | POA: Diagnosis present

## 2016-10-06 DIAGNOSIS — Z853 Personal history of malignant neoplasm of breast: Secondary | ICD-10-CM

## 2016-10-06 DIAGNOSIS — J9620 Acute and chronic respiratory failure, unspecified whether with hypoxia or hypercapnia: Secondary | ICD-10-CM

## 2016-10-06 DIAGNOSIS — Z825 Family history of asthma and other chronic lower respiratory diseases: Secondary | ICD-10-CM

## 2016-10-06 DIAGNOSIS — Z803 Family history of malignant neoplasm of breast: Secondary | ICD-10-CM | POA: Diagnosis not present

## 2016-10-06 DIAGNOSIS — I2721 Secondary pulmonary arterial hypertension: Secondary | ICD-10-CM | POA: Diagnosis present

## 2016-10-06 DIAGNOSIS — D860 Sarcoidosis of lung: Secondary | ICD-10-CM | POA: Diagnosis present

## 2016-10-06 DIAGNOSIS — J449 Chronic obstructive pulmonary disease, unspecified: Secondary | ICD-10-CM | POA: Diagnosis not present

## 2016-10-06 DIAGNOSIS — Z90710 Acquired absence of both cervix and uterus: Secondary | ICD-10-CM

## 2016-10-06 DIAGNOSIS — Z8249 Family history of ischemic heart disease and other diseases of the circulatory system: Secondary | ICD-10-CM | POA: Diagnosis not present

## 2016-10-06 DIAGNOSIS — D573 Sickle-cell trait: Secondary | ICD-10-CM | POA: Diagnosis present

## 2016-10-06 DIAGNOSIS — J441 Chronic obstructive pulmonary disease with (acute) exacerbation: Secondary | ICD-10-CM | POA: Diagnosis not present

## 2016-10-06 DIAGNOSIS — I10 Essential (primary) hypertension: Secondary | ICD-10-CM | POA: Diagnosis present

## 2016-10-06 DIAGNOSIS — Z9011 Acquired absence of right breast and nipple: Secondary | ICD-10-CM

## 2016-10-06 DIAGNOSIS — Z79811 Long term (current) use of aromatase inhibitors: Secondary | ICD-10-CM

## 2016-10-06 DIAGNOSIS — Z9981 Dependence on supplemental oxygen: Secondary | ICD-10-CM

## 2016-10-06 DIAGNOSIS — J9601 Acute respiratory failure with hypoxia: Secondary | ICD-10-CM | POA: Diagnosis present

## 2016-10-06 DIAGNOSIS — F418 Other specified anxiety disorders: Secondary | ICD-10-CM | POA: Diagnosis present

## 2016-10-06 DIAGNOSIS — J80 Acute respiratory distress syndrome: Secondary | ICD-10-CM | POA: Diagnosis not present

## 2016-10-06 DIAGNOSIS — D869 Sarcoidosis, unspecified: Secondary | ICD-10-CM | POA: Diagnosis not present

## 2016-10-06 DIAGNOSIS — J90 Pleural effusion, not elsewhere classified: Secondary | ICD-10-CM | POA: Diagnosis not present

## 2016-10-06 DIAGNOSIS — Z7982 Long term (current) use of aspirin: Secondary | ICD-10-CM | POA: Diagnosis not present

## 2016-10-06 DIAGNOSIS — Z6828 Body mass index (BMI) 28.0-28.9, adult: Secondary | ICD-10-CM | POA: Diagnosis not present

## 2016-10-06 DIAGNOSIS — R0602 Shortness of breath: Secondary | ICD-10-CM | POA: Diagnosis not present

## 2016-10-06 DIAGNOSIS — J9621 Acute and chronic respiratory failure with hypoxia: Secondary | ICD-10-CM | POA: Diagnosis not present

## 2016-10-06 LAB — URINALYSIS, ROUTINE W REFLEX MICROSCOPIC
BILIRUBIN URINE: NEGATIVE
Glucose, UA: NEGATIVE mg/dL
KETONES UR: NEGATIVE mg/dL
NITRITE: NEGATIVE
Protein, ur: NEGATIVE mg/dL
Specific Gravity, Urine: 1.011 (ref 1.005–1.030)
pH: 6 (ref 5.0–8.0)

## 2016-10-06 LAB — CBC
HCT: 35.7 % — ABNORMAL LOW (ref 36.0–46.0)
Hemoglobin: 11.7 g/dL — ABNORMAL LOW (ref 12.0–15.0)
MCH: 25.9 pg — ABNORMAL LOW (ref 26.0–34.0)
MCHC: 32.8 g/dL (ref 30.0–36.0)
MCV: 79.2 fL (ref 78.0–100.0)
Platelets: 401 10*3/uL — ABNORMAL HIGH (ref 150–400)
RBC: 4.51 MIL/uL (ref 3.87–5.11)
RDW: 15 % (ref 11.5–15.5)
WBC: 8.9 10*3/uL (ref 4.0–10.5)

## 2016-10-06 LAB — I-STAT TROPONIN, ED: TROPONIN I, POC: 0 ng/mL (ref 0.00–0.08)

## 2016-10-06 LAB — BASIC METABOLIC PANEL
Anion gap: 7 (ref 5–15)
BUN: 7 mg/dL (ref 6–20)
CALCIUM: 9.2 mg/dL (ref 8.9–10.3)
CO2: 31 mmol/L (ref 22–32)
CREATININE: 0.67 mg/dL (ref 0.44–1.00)
Chloride: 101 mmol/L (ref 101–111)
GFR calc Af Amer: >60 mL/min (ref >60)
GLUCOSE: 93 mg/dL (ref 65–99)
Potassium: 3.8 mmol/L (ref 3.5–5.1)
Sodium: 139 mmol/L (ref 135–145)

## 2016-10-06 LAB — URINE MICROSCOPIC-ADD ON

## 2016-10-06 LAB — TROPONIN I

## 2016-10-06 LAB — D-DIMER, QUANTITATIVE (NOT AT ARMC): D DIMER QUANT: 1.78 ug{FEU}/mL — AB (ref 0.00–0.50)

## 2016-10-06 LAB — BRAIN NATRIURETIC PEPTIDE: B Natriuretic Peptide: 52.1 pg/mL (ref 0.0–100.0)

## 2016-10-06 MED ORDER — ORAL CARE MOUTH RINSE
15.0000 mL | Freq: Two times a day (BID) | OROMUCOSAL | Status: DC
  Administered 2016-10-07 – 2016-10-09 (×4): 15 mL via OROMUCOSAL

## 2016-10-06 MED ORDER — SODIUM CHLORIDE 0.9 % IV SOLN
INTRAVENOUS | Status: AC
  Administered 2016-10-06 – 2016-10-07 (×2): via INTRAVENOUS

## 2016-10-06 MED ORDER — METOPROLOL TARTRATE 25 MG PO TABS
25.0000 mg | ORAL_TABLET | Freq: Two times a day (BID) | ORAL | Status: DC
  Administered 2016-10-06 – 2016-10-10 (×8): 25 mg via ORAL
  Filled 2016-10-06 (×8): qty 1

## 2016-10-06 MED ORDER — ACETAMINOPHEN 325 MG PO TABS
650.0000 mg | ORAL_TABLET | Freq: Four times a day (QID) | ORAL | Status: DC | PRN
  Administered 2016-10-07 – 2016-10-09 (×3): 650 mg via ORAL
  Filled 2016-10-06 (×3): qty 2

## 2016-10-06 MED ORDER — METHYLPREDNISOLONE SODIUM SUCC 125 MG IJ SOLR
125.0000 mg | Freq: Once | INTRAMUSCULAR | Status: AC
  Administered 2016-10-06: 125 mg via INTRAVENOUS
  Filled 2016-10-06: qty 2

## 2016-10-06 MED ORDER — ONDANSETRON HCL 4 MG/2ML IJ SOLN: 4.0000 mg | Freq: Four times a day (QID) | INTRAMUSCULAR | Status: DC | PRN

## 2016-10-06 MED ORDER — SODIUM CHLORIDE 0.9 % IV BOLUS (SEPSIS): 1000.0000 mL | Freq: Once | INTRAVENOUS | Status: DC

## 2016-10-06 MED ORDER — IOPAMIDOL (ISOVUE-370) INJECTION 76%
100.0000 mL | Freq: Once | INTRAVENOUS | Status: AC | PRN
  Administered 2016-10-06: 100 mL via INTRAVENOUS

## 2016-10-06 MED ORDER — IPRATROPIUM-ALBUTEROL 0.5-2.5 (3) MG/3ML IN SOLN: 3.0000 mL | RESPIRATORY_TRACT | Status: DC

## 2016-10-06 MED ORDER — ACETAMINOPHEN 650 MG RE SUPP: 650.0000 mg | Freq: Four times a day (QID) | RECTAL | Status: DC | PRN

## 2016-10-06 MED ORDER — SERTRALINE HCL 50 MG PO TABS
50.0000 mg | ORAL_TABLET | Freq: Every day | ORAL | Status: DC
  Administered 2016-10-06 – 2016-10-09 (×4): 50 mg via ORAL
  Filled 2016-10-06 (×4): qty 1

## 2016-10-06 MED ORDER — PANTOPRAZOLE SODIUM 40 MG PO TBEC
40.0000 mg | DELAYED_RELEASE_TABLET | Freq: Every day | ORAL | Status: DC
  Administered 2016-10-07 – 2016-10-10 (×4): 40 mg via ORAL
  Filled 2016-10-06 (×4): qty 1

## 2016-10-06 MED ORDER — SODIUM CHLORIDE 0.9% FLUSH
3.0000 mL | Freq: Two times a day (BID) | INTRAVENOUS | Status: DC
Start: 2016-10-06 — End: 2016-10-10
  Administered 2016-10-07 – 2016-10-10 (×7): 3 mL via INTRAVENOUS

## 2016-10-06 MED ORDER — IPRATROPIUM-ALBUTEROL 0.5-2.5 (3) MG/3ML IN SOLN: 3.0000 mL | RESPIRATORY_TRACT | Status: DC | PRN

## 2016-10-06 MED ORDER — ANASTROZOLE 1 MG PO TABS
1.0000 mg | ORAL_TABLET | Freq: Every day | ORAL | Status: DC
  Administered 2016-10-06 – 2016-10-09 (×4): 1 mg via ORAL
  Filled 2016-10-06 (×4): qty 1

## 2016-10-06 MED ORDER — ONDANSETRON HCL 4 MG PO TABS: 4.0000 mg | ORAL_TABLET | Freq: Four times a day (QID) | ORAL | Status: DC | PRN

## 2016-10-06 MED ORDER — HYDROCHLOROTHIAZIDE 12.5 MG PO CAPS
12.5000 mg | ORAL_CAPSULE | Freq: Every day | ORAL | Status: DC
  Administered 2016-10-07 – 2016-10-10 (×4): 12.5 mg via ORAL
  Filled 2016-10-06 (×4): qty 1

## 2016-10-06 MED ORDER — ALBUTEROL SULFATE (2.5 MG/3ML) 0.083% IN NEBU
5.0000 mg | INHALATION_SOLUTION | Freq: Once | RESPIRATORY_TRACT | Status: AC
  Administered 2016-10-06: 5 mg via RESPIRATORY_TRACT
  Filled 2016-10-06: qty 6

## 2016-10-06 MED ORDER — ALBUTEROL (5 MG/ML) CONTINUOUS INHALATION SOLN
10.0000 mg/h | INHALATION_SOLUTION | Freq: Once | RESPIRATORY_TRACT | Status: AC
  Administered 2016-10-06: 10 mg/h via RESPIRATORY_TRACT
  Filled 2016-10-06: qty 20

## 2016-10-06 MED ORDER — METHYLPREDNISOLONE SODIUM SUCC 125 MG IJ SOLR
60.0000 mg | Freq: Four times a day (QID) | INTRAMUSCULAR | Status: DC
  Administered 2016-10-07 – 2016-10-10 (×14): 60 mg via INTRAVENOUS
  Filled 2016-10-06 (×14): qty 2

## 2016-10-06 MED ORDER — IPRATROPIUM-ALBUTEROL 0.5-2.5 (3) MG/3ML IN SOLN
3.0000 mL | Freq: Three times a day (TID) | RESPIRATORY_TRACT | Status: DC
  Administered 2016-10-07: 3 mL via RESPIRATORY_TRACT
  Filled 2016-10-06: qty 3

## 2016-10-06 MED ORDER — IPRATROPIUM-ALBUTEROL 0.5-2.5 (3) MG/3ML IN SOLN
3.0000 mL | RESPIRATORY_TRACT | Status: AC
  Administered 2016-10-06 (×3): 3 mL via RESPIRATORY_TRACT
  Filled 2016-10-06: qty 9

## 2016-10-06 MED ORDER — MOMETASONE FURO-FORMOTEROL FUM 200-5 MCG/ACT IN AERO
2.0000 | INHALATION_SPRAY | Freq: Two times a day (BID) | RESPIRATORY_TRACT | Status: DC
  Administered 2016-10-06 – 2016-10-10 (×8): 2 via RESPIRATORY_TRACT
  Filled 2016-10-06: qty 8.8

## 2016-10-06 MED ORDER — GABAPENTIN 100 MG PO CAPS
100.0000 mg | ORAL_CAPSULE | Freq: Every day | ORAL | Status: DC
  Administered 2016-10-06 – 2016-10-09 (×4): 100 mg via ORAL
  Filled 2016-10-06 (×4): qty 1

## 2016-10-06 MED ORDER — ENSURE ENLIVE PO LIQD
237.0000 mL | Freq: Two times a day (BID) | ORAL | Status: DC
  Administered 2016-10-07 – 2016-10-10 (×8): 237 mL via ORAL

## 2016-10-06 MED ORDER — CHLORHEXIDINE GLUCONATE 0.12 % MT SOLN
15.0000 mL | Freq: Two times a day (BID) | OROMUCOSAL | Status: DC
  Administered 2016-10-06: 15 mL via OROMUCOSAL
  Filled 2016-10-06 (×3): qty 15

## 2016-10-06 MED ORDER — ASPIRIN EC 81 MG PO TBEC
81.0000 mg | DELAYED_RELEASE_TABLET | Freq: Every day | ORAL | Status: DC
  Administered 2016-10-07 – 2016-10-10 (×4): 81 mg via ORAL
  Filled 2016-10-06 (×4): qty 1

## 2016-10-06 MED ORDER — SODIUM CHLORIDE 0.9 % IV BOLUS (SEPSIS)
1000.0000 mL | Freq: Once | INTRAVENOUS | Status: AC
  Administered 2016-10-06: 1000 mL via INTRAVENOUS

## 2016-10-06 NOTE — ED Provider Notes (Addendum)
[facility] DEPT Provider Note   CSN: [id number] Arrival date & time: 10/06/16  1219     History   Chief Complaint Chief Complaint  Patient presents with  . Shortness of Breath  . Weakness    HPI [name] is a 55 y.o. female.  HPI Complains of shortness of breath onset 1-2 weeks ago, progressively worsening. Dyspnea is worse with exertion and improved with rest. Denies chest pain. Visit to mild cough, worse in the mornings.. She awakens in the morning feeling his of mucus is stuck in her throat. No other associated symptoms. Past Medical History:  Diagnosis Date  . Allergic rhinitis   . COPD, mild ([place]) FOLLOWED BY DR Melvyn Novas  . GERD (gastroesophageal reflux disease)   . HTN (hypertension)   . Hypertension   . Iron deficiency anemia   . Long-term current use of steroids SYMBICORT INHALER  . 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 Sarcoidosis ([place]) STABLE PER CXR JUNE 2013  . Shortness of breath   . Sickle cell trait [facility])    Patient not currently on steroids Patient Active Problem List   Diagnosis Date Noted  . Depression with anxiety 08/07/2016  . Subacromial impingement of left shoulder 07/19/2016  . Overweight (BMI 25.0-29.9) 05/11/2016  . Hot flashes 02/01/2016  . Constipation 01/13/2016  . Breast cancer of upper-outer quadrant of right female breast ([place]) 08/04/2015  . Tibial pain 06/03/2015  . Rotator cuff impingement syndrome 09/14/2014  . Menopausal syndrome (hot flashes) 01/19/2014  . GERD (gastroesophageal reflux disease) 12/21/2012  . Hyperlipidemia 12/21/2012  . SICKLE-CELL TRAIT 04/15/2008  . Sarcoidosis ([place]) 01/10/2007  . ANEMIA, IRON DEFICIENCY, UNSPEC. 01/10/2007  . HYPERTENSION, BENIGN SYSTEMIC 01/10/2007  . COPD  GOLD III 01/10/2007    Past Surgical History:  Procedure Laterality Date  . ADJUSTABLE SUTURE MANIPULATION  05/22/2012   Procedure: ADJUSTABLE SUTURE MANIPULATION;  Surgeon: Dara Hoyer, MD;  Location: [facility];   Service: Ophthalmology;  Laterality: Right;  . CESAREAN SECTION  1986   W/ BILATERAL TUBAL LIGATION  . EYE SURGERY    . MEDIAN RECTUS REPAIR  05/22/2012   Procedure: MEDIAN RECTUS REPAIR;  Surgeon: Dara Hoyer, MD;  Location: [facility];  Service: Ophthalmology;  Laterality: Bilateral;  INFERIOR RECTUS RESECTION WITH ADJUSTIBLE SUTURES RIGHT EYE   . RADIOACTIVE SEED GUIDED MASTECTOMY WITH AXILLARY SENTINEL LYMPH NODE BIOPSY Right 08/18/2015   Procedure: RADIOACTIVE SEED GUIDED PARTIAL MASTECTOMY WITH AXILLARY SENTINEL LYMPH NODE BIOPSY;  Surgeon: Stark Klein, MD;  Location: [facility];  Service: General;  Laterality: Right;  . TOTAL ROBOTIC ASSISTED LAPAROSCOPIC HYSTERECTOMY  12-30-2010   SYMPTOMATIC UTERINE FIBROIDS  . UPPER TEETH EXTRACTION'S  1992    OB History    No data available       Home Medications    Prior to Admission medications   Medication Sig Start Date End Date Taking? Authorizing Provider  acetaminophen (TYLENOL) 500 MG tablet Take 1,000 mg by mouth every 6 (six) hours as needed for fever. Reported on 05/10/2016   Yes Historical Provider, MD  albuterol (PROVENTIL HFA;VENTOLIN HFA) 108 (90 Base) MCG/ACT inhaler Inhale 2 puffs into the lungs every 6 (six) hours as needed for wheezing or shortness of breath. 05/11/16  Yes Tanda Rockers, MD  anastrozole (ARIMIDEX) 1 MG tablet Take 1 tablet (1 mg total) by mouth daily. Patient taking differently: Take 1 mg by mouth at bedtime.  11/22/15  Yes Nicholas Lose, MD  aspirin EC  81 MG EC tablet Take 1 tablet (81 mg total) by mouth daily. 12/21/12  Yes Marin Olp, MD  budesonide-formoterol [facility]) 160-4.5 MCG/ACT inhaler Take 2 puffs first thing in am and then another 2 puffs about 12 hours later. 05/11/16  Yes Tanda Rockers, MD  CALCIUM PO Take 1 tablet by mouth daily.   Yes Historical Provider, MD  fluticasone (FLONASE) 50 MCG/ACT nasal spray Place 2 sprays into both nostrils daily as needed  for allergies. 02/23/16  Yes Mariel Aloe, MD  gabapentin (NEURONTIN) 100 MG capsule Take 1 capsule (100 mg total) by mouth at bedtime. 02/01/16  Yes Nicholas Lose, MD  hydrochlorothiazide (HYDRODIURIL) 12.5 MG tablet Take 1 tablet (12.5 mg total) by mouth daily. 10/18/15  Yes Mariel Aloe, MD  metoprolol tartrate (LOPRESSOR) 25 MG tablet Take 1 tablet (25 mg total) by mouth 2 (two) times daily. 05/10/16  Yes Mariel Aloe, MD  Multiple Vitamin (MULTIVITAMIN) capsule Take 1 capsule by mouth daily.   Yes Historical Provider, MD  omeprazole (PRILOSEC) 20 MG capsule Take 1 capsule (20 mg total) by mouth daily. 09/21/16  Yes Donnamae Jude, MD  sertraline (ZOLOFT) 50 MG tablet Take 1 tablet (50 mg total) by mouth daily. Patient taking differently: Take 50 mg by mouth at bedtime.  09/21/16  Yes Donnamae Jude, MD  diclofenac (VOLTAREN) 75 MG EC tablet Take 1 tablet (75 mg total) by mouth 2 (two) times daily. Patient not taking: Reported on 10/06/2016 07/19/16   Janora Norlander, DO  zolpidem (AMBIEN) 5 MG tablet Take 1 tablet (5 mg total) by mouth at bedtime as needed for sleep. Patient not taking: Reported on 10/06/2016 08/07/16   Nicolette Bang, DO    Family History Family History  Problem Relation Age of Onset  . Hyperlipidemia Mother   . Asthma Mother   . Lupus Mother   . Breast cancer Mother 36  . Hypertension Father   . Hyperlipidemia Father   . Hypertension Sister   . Hypertension Brother   . Cancer Neg Hx   . Diabetes Neg Hx   . Coronary artery disease Neg Hx     Social History Social History  Substance Use Topics  . Smoking status: Former Smoker    Packs/day: 1.00    Years: 20.00    Quit date: 11/14/2003  . Smokeless tobacco: Never Used  . Alcohol use No  No illicit drug use   Allergies   Codeine   Review of Systems Review of Systems  Constitutional: Negative.   HENT: Negative.   Respiratory: Positive for shortness of breath.   Cardiovascular: Negative.     Gastrointestinal: Negative.   Musculoskeletal: Negative.   Skin: Negative.   Neurological: Negative.   Psychiatric/Behavioral: Negative.   All other systems reviewed and are negative.    Physical Exam Updated Vital Signs BP 149/95   Pulse 83   Temp 98.5 F (36.9 C)   Resp 14   Ht 5\' 5"  (1.651 m)   Wt 171 lb (77.6 kg)   SpO2 97%   BMI 28.46 kg/m   Physical Exam  Constitutional: She appears well-developed and well-nourished. No distress.  HENT:  Head: Normocephalic and atraumatic.  Eyes: Conjunctivae are normal. Pupils are equal, round, and reactive to light.  Neck: Neck supple. No tracheal deviation present. No thyromegaly present.  Cardiovascular: Normal rate and regular rhythm.   No murmur heard. Pulmonary/Chest: Effort normal. She has wheezes.  Minimal end expiratory wheezes  Abdominal: Soft. Bowel sounds are normal. She exhibits no distension. There is no tenderness.  Musculoskeletal: Normal range of motion. She exhibits no edema or tenderness.  Neurological: She is alert. Coordination normal.  Skin: Skin is warm and dry. No rash noted.  Psychiatric: She has a normal mood and affect.  Nursing note and vitals reviewed.    ED Treatments / Results  Labs (all labs ordered are listed, but only abnormal results are displayed) Labs Reviewed  CBC - Abnormal; Notable for the following:       Result Value   Hemoglobin 11.7 (*)    HCT 35.7 (*)    MCH 25.9 (*)    Platelets 401 (*)    All other components within normal limits  URINALYSIS, ROUTINE W REFLEX MICROSCOPIC (NOT AT [facility]) - Abnormal; Notable for the following:    Hgb urine dipstick TRACE (*)    Leukocytes, UA TRACE (*)    All other components within normal limits  URINE MICROSCOPIC-ADD ON - Abnormal; Notable for the following:    Squamous Epithelial / LPF 6-30 (*)    Bacteria, UA RARE (*)    Casts HYALINE CASTS (*)    All other components within normal limits  BASIC METABOLIC PANEL  BRAIN NATRIURETIC  PEPTIDE  D-DIMER, QUANTITATIVE (NOT AT [facility])  CBG MONITORING, ED  I-STAT TROPOININ, ED    EKG  EKG Interpretation  Date/Time:  Friday October 06 2016 12:33:35 EST Ventricular Rate:  93 PR Interval:    QRS Duration: 94 QT Interval:  383 QTC Calculation: 477 R Axis:   59 Text Interpretation:  Sinus rhythm Biatrial enlargement Baseline wander in lead(s) II III aVF since last tracing no significant change Confirmed by Eulis Foster  MD, Vira Agar [id number]) on 10/06/2016 1:19:57 PM     Chest x-ray viewed by me  Radiology Dg Chest 2 View  Result Date: 10/06/2016 CLINICAL DATA:  Shortness of breath today, COPD, history breast cancer post RIGHT lumpectomy, hypertension, sarcoidosis, COPD, GERD EXAM: CHEST  2 VIEW COMPARISON:  01/14/2016 FINDINGS: Normal heart size, mediastinal contours, and pulmonary vascularity. Chronic interstitial thickening throughout both lungs greater at the lower RIGHT chest, likely related to history of sarcoidosis. Tiny RIGHT pleural effusion, new. No definite acute infiltrate, LEFT pleural effusion, or pneumothorax. Surgical clips RIGHT breast and RIGHT axilla. No acute osseous findings. IMPRESSION: Chronic interstitial lung disease likely related to history of sarcoidosis, unchanged. Tiny new RIGHT pleural effusion. Electronically Signed   By: Lavonia Dana M.D.   On: 10/06/2016 13:11    Procedures Procedures (including critical care time)  Medications Ordered in ED Medications  albuterol (PROVENTIL) (2.5 MG/3ML) 0.083% nebulizer solution 5 mg (5 mg Nebulization Given 10/06/16 1243)  albuterol (PROVENTIL,VENTOLIN) solution continuous neb (10 mg/hr Nebulization Given 10/06/16 1509)  sodium chloride 0.9 % bolus 1,000 mL (1,000 mLs Intravenous New Bag/Given 10/06/16 1505)     Initial Impression / Assessment and Plan / ED Course  I have reviewed the triage vital signs and the nursing notes.  Pertinent labs & imaging results that were available during my care of the  patient were reviewed by me and considered in my medical decision making (see chart for details). Results for orders placed or performed during the hospital encounter of 123456  Basic metabolic panel  Result Value Ref Range   Sodium 139 135 - 145 mmol/L   Potassium 3.8 3.5 - 5.1 mmol/L   Chloride 101 101 - 111 mmol/L   CO2 31 22 - 32 mmol/L  Glucose, Bld 93 65 - 99 mg/dL   BUN 7 6 - 20 mg/dL   Creatinine, Ser 0.67 0.44 - 1.00 mg/dL   Calcium 9.2 8.9 - 10.3 mg/dL   GFR calc non Af Amer >60 >60 mL/min   GFR calc Af Amer >60 >60 mL/min   Anion gap 7 5 - 15  CBC  Result Value Ref Range   WBC 8.9 4.0 - 10.5 K/uL   RBC 4.51 3.87 - 5.11 MIL/uL   Hemoglobin 11.7 (L) 12.0 - 15.0 g/dL   HCT 35.7 (L) 36.0 - 46.0 %   MCV 79.2 78.0 - 100.0 fL   MCH 25.9 (L) 26.0 - 34.0 pg   MCHC 32.8 30.0 - 36.0 g/dL   RDW 15.0 11.5 - 15.5 %   Platelets 401 (H) 150 - 400 K/uL  Urinalysis, Routine w reflex microscopic  Result Value Ref Range   Color, Urine YELLOW YELLOW   APPearance CLEAR CLEAR   Specific Gravity, Urine 1.011 1.005 - 1.030   pH 6.0 5.0 - 8.0   Glucose, UA NEGATIVE NEGATIVE mg/dL   Hgb urine dipstick TRACE (A) NEGATIVE   Bilirubin Urine NEGATIVE NEGATIVE   Ketones, ur NEGATIVE NEGATIVE mg/dL   Protein, ur NEGATIVE NEGATIVE mg/dL   Nitrite NEGATIVE NEGATIVE   Leukocytes, UA TRACE (A) NEGATIVE  D-dimer, quantitative (not at [facility])  Result Value Ref Range   D-Dimer, Quant 1.78 (H) 0.00 - 0.50 ug/mL-FEU  Brain natriuretic peptide  Result Value Ref Range   B Natriuretic Peptide 52.1 0.0 - 100.0 pg/mL  Urine microscopic-add on  Result Value Ref Range   Squamous Epithelial / LPF 6-30 (A) NONE SEEN   WBC, UA 6-30 0 - 5 WBC/hpf   RBC / HPF 0-5 0 - 5 RBC/hpf   Bacteria, UA RARE (A) NONE SEEN   Casts HYALINE CASTS (A) NEGATIVE   Urine-Other MUCOUS PRESENT   I-stat troponin, ED  Result Value Ref Range   Troponin i, poc 0.00 0.00 - 0.08 ng/mL   Comment 3           Dg Chest 2  View  Result Date: 10/06/2016 CLINICAL DATA:  Shortness of breath today, COPD, history breast cancer post RIGHT lumpectomy, hypertension, sarcoidosis, COPD, GERD EXAM: CHEST  2 VIEW COMPARISON:  01/14/2016 FINDINGS: Normal heart size, mediastinal contours, and pulmonary vascularity. Chronic interstitial thickening throughout both lungs greater at the lower RIGHT chest, likely related to history of sarcoidosis. Tiny RIGHT pleural effusion, new. No definite acute infiltrate, LEFT pleural effusion, or pneumothorax. Surgical clips RIGHT breast and RIGHT axilla. No acute osseous findings. IMPRESSION: Chronic interstitial lung disease likely related to history of sarcoidosis, unchanged. Tiny new RIGHT pleural effusion. Electronically Signed   By: Lavonia Dana M.D.   On: 10/06/2016 13:11   Ct Angio Chest Pe W Or Wo Contrast  Result Date: 10/06/2016 CLINICAL DATA:  55 year old female with sarcoidosis presents with worsening shortness of breath and elevated D-dimer. EXAM: CT ANGIOGRAPHY CHEST WITH CONTRAST TECHNIQUE: Multidetector CT imaging of the chest was performed using the standard protocol during bolus administration of intravenous contrast. Multiplanar CT image reconstructions and MIPs were obtained to evaluate the vascular anatomy. CONTRAST:  100 cc Isovue-300 IV. COMPARISON:  Chest radiograph from earlier today. 02/08/2011 chest CT angiogram. FINDINGS: Cardiovascular: The study is moderate quality for the evaluation of pulmonary embolism, with evaluation of the segmental and subsegmental pulmonary artery branches degraded by motion. There are no filling defects in the central, lobar, segmental or subsegmental  pulmonary artery branches to suggest acute pulmonary embolism. Atherosclerotic nonaneurysmal thoracic aorta. Dilated main pulmonary artery (3.5 cm diameter), stable since 2012. Normal heart size. No significant pericardial fluid/thickening. Mediastinum/Nodes: No discrete thyroid nodules. Unremarkable  esophagus. Surgical clips are noted in the right axilla. No axillary adenopathy. Mildly enlarged 1.0 cm AP window node (series 4/image 52), previously 0.9 cm, not definitely changed. No additional pathologically enlarged mediastinal or hilar nodes. Lungs/Pleura: No pneumothorax. Small right pleural effusion. Apical left upper lobe 6 mm pulmonary nodule (series 11/ image 19) is stable since 02/08/2011 and considered benign. Lingular 9 mm solid pulmonary nodule (series 11/ image 62), previously 8 mm, not significantly changed, considered benign. No acute consolidative airspace disease or new significant pulmonary nodules. There is extensive patchy ground-glass attenuation and interlobular septal thickening throughout both lungs, most prominent in the upper lobes, increased since 02/08/2011. There is mild thickening of the central peribronchovascular interstitium and fine nodularity of the peripheral peribronchovascular interstitium and subpleural and perifissural lungs bilaterally, not convincingly changed since 2012. There are patchy regions of parenchymal banding and perilobular lines with associated mild traction bronchiolectasis, distortion and volume loss in both lungs, not convincingly changed. Upper abdomen: Partially visualized mild gastrohepatic ligament lymphadenopathy measuring up to 1.5 cm (series 4/image 98), stable. Partially visualized left upper para-aortic adenopathy measuring up to 1.5 cm (series 4/ image 104), stable. Musculoskeletal: No aggressive appearing focal osseous lesions. Mild thoracic spondylosis. Review of the MIP images confirms the above findings. IMPRESSION: 1. Motion degraded scan.  No evidence of pulmonary embolism. 2. Spectrum of findings consistent with pulmonary sarcoidosis, including central peribronchovascular interstitial thickening, fine perilymphatic distribution nodularity, interlobular septal thickening, patchy ground-glass attenuation and scattered areas of fibrosis,  mildly progressed since 02/08/2011, particularly the ground-glass attenuation and interlobular septal thickening. 3. Small right pleural effusion. 4. Mild mediastinal and upper retroperitoneal lymphadenopathy, stable, consistent with sarcoidosis. 5. Stable dilated main pulmonary artery, suggesting chronic pulmonary arterial hypertension. 6. Aortic atherosclerosis. Electronically Signed   By: Ilona Sorrel M.D.   On: 10/06/2016 17:56   Clinical Course     Felt somewhat improved after nebulized treatment. Oxygen removed. Pulse oximetry dropped to 78%, on room air consistent with hypoxia. Continuous nebulization ordered.  CT angiogram of chest ordered to check for pulmonary embolism in light of dyspnea and elevated d-dimer. Patient signed out to [place] 5:55 PM .Final Clinical Impressions(s) / ED Diagnoses   Final diagnoses:  None  DX ACUTE DYSPNEA  New Prescriptions New Prescriptions   No medications on file     Orlie Dakin, MD 10/06/16 [place], MD 10/06/16 Vernelle Emerald

## 2016-10-06 NOTE — ED Notes (Signed)
Pt treatment pt speaks some relief.

## 2016-10-06 NOTE — ED Provider Notes (Signed)
I assumed care of this patient from Dr. Winfred Leeds at 1800.  Please see their note for further details of Hx, PE.  Briefly patient is a 55 y.o. female with a Shortness of Breath and Weakness  that is concerning for COPD exacerbation. Patient also notable to have elevated Amer. Currently awaiting CT PE study. CT PE study without evidence of pneumonia, pulmonary embolism. Provided with breathing treatments with minimal improvement in her new oxygen requirement. Admitted to hospitalist for continued management.        Fatima Blank, MD 10/07/16 [phone]

## 2016-10-06 NOTE — H&P (Signed)
History and Physical    [name] [id number] DOB: 13-Sep-1961 DOA: 10/06/2016  PCP: Melina Schools, DO  Pulm: Dr. Melvyn Novas  Patient coming from: Home  Chief Complaint: Progressive shortness of breath  HPI: [name] is a 55 y.o. woman with a history of COPD, pulmonary sarcoidosis, HTN, breast cancer, and depression who presents to the ED for evaluation of progressive shortness of breath at rest and dyspnea on exertion.  Symptoms have been progressive over the past 1-2 weeks.  She has had intermittent cough with thick white sputum production (primarily in the mornings).  She denies fevers, chills, or sweats.  No light-headedness or syncope.  No swelling.  She has not taken anything over the counter or received any prescriptions as an outpatient for these symptoms.  She uses her Symbicort inhaler twice daily and she has an albuterol rescue inhaler at baseline.  She had a restless night due to shortness of breath and developed right sided chest heaviness this morning that was self limited.  When she could not walk from her bedroom to her restroom without feeling breathless, she decided to come to the ED.  ED Course: Upon arrival, she was found to be profoundly hypoxic with O2 sats in the upper 70's on RA.  She has felt better after being placed on 3L [place].  She has received duonebs, IV solumedrol 125 mg, and 1L NS bolus. Troponin negative.  BNP normal.  Basic labs fairly unremarkable.  Positive D-Dimer prompted CTA chest.  No PE but she has multiple findings consistent with her known history of sarcoidosis, mildy progressed since 2012.  She is clinically improved, but still has intermittent wheezing and an oxygen requirement which is new.  Hospitalist asked to place in observation.  Last seen by Dr. Melvyn Novas in June.  She has been off chronic steroids since 2012.  Review of Systems: As per HPI otherwise 10 point review of systems negative.    Past Medical History:  Diagnosis Date  .  Allergic rhinitis   . COPD, mild ([place]) FOLLOWED BY DR Melvyn Novas  . GERD (gastroesophageal reflux disease)   . HTN (hypertension)   . Hypertension   . Iron deficiency anemia   . Long-term current use of steroids SYMBICORT INHALER  . 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 Sarcoidosis ([place]) STABLE PER CXR JUNE 2013  . Shortness of breath   . Sickle cell trait [facility])     Past Surgical History:  Procedure Laterality Date  . ADJUSTABLE SUTURE MANIPULATION  05/22/2012   Procedure: ADJUSTABLE SUTURE MANIPULATION;  Surgeon: Dara Hoyer, MD;  Location: [facility];  Service: Ophthalmology;  Laterality: Right;  . CESAREAN SECTION  1986   W/ BILATERAL TUBAL LIGATION  . EYE SURGERY    . MEDIAN RECTUS REPAIR  05/22/2012   Procedure: MEDIAN RECTUS REPAIR;  Surgeon: Dara Hoyer, MD;  Location: [facility];  Service: Ophthalmology;  Laterality: Bilateral;  INFERIOR RECTUS RESECTION WITH ADJUSTIBLE SUTURES RIGHT EYE   . RADIOACTIVE SEED GUIDED MASTECTOMY WITH AXILLARY SENTINEL LYMPH NODE BIOPSY Right 08/18/2015   Procedure: RADIOACTIVE SEED GUIDED PARTIAL MASTECTOMY WITH AXILLARY SENTINEL LYMPH NODE BIOPSY;  Surgeon: Stark Klein, MD;  Location: [place];  Service: General;  Laterality: Right;  . TOTAL ROBOTIC ASSISTED LAPAROSCOPIC HYSTERECTOMY  12-30-2010   SYMPTOMATIC UTERINE FIBROIDS  . UPPER TEETH EXTRACTION'S  1992     reports that she quit smoking about 12 years ago. She has a 20.00 pack-year smoking  history. She has never used smokeless tobacco. She reports that she does not drink alcohol or use drugs.  Allergies  Allergen Reactions  . Codeine Other (See Comments)    Avoids-felt bad when took before    Family History  Problem Relation Age of Onset  . Hyperlipidemia Mother   . Asthma Mother   . Lupus Mother   . Breast cancer Mother 14  . Hypertension Father   . Hyperlipidemia Father   . Hypertension Sister   . Hypertension Brother   . Cancer Neg Hx    . Diabetes Neg Hx   . Coronary artery disease Neg Hx      Prior to Admission medications   Medication Sig Start Date End Date Taking? Authorizing Provider  acetaminophen (TYLENOL) 500 MG tablet Take 1,000 mg by mouth every 6 (six) hours as needed for fever. Reported on 05/10/2016   Yes Historical Provider, MD  albuterol (PROVENTIL HFA;VENTOLIN HFA) 108 (90 Base) MCG/ACT inhaler Inhale 2 puffs into the lungs every 6 (six) hours as needed for wheezing or shortness of breath. 05/11/16  Yes Tanda Rockers, MD  anastrozole (ARIMIDEX) 1 MG tablet Take 1 tablet (1 mg total) by mouth daily. Patient taking differently: Take 1 mg by mouth at bedtime.  11/22/15  Yes Nicholas Lose, MD  aspirin EC 81 MG EC tablet Take 1 tablet (81 mg total) by mouth daily. 12/21/12  Yes Marin Olp, MD  budesonide-formoterol [facility]) 160-4.5 MCG/ACT inhaler Take 2 puffs first thing in am and then another 2 puffs about 12 hours later. 05/11/16  Yes Tanda Rockers, MD  CALCIUM PO Take 1 tablet by mouth daily.   Yes Historical Provider, MD  fluticasone (FLONASE) 50 MCG/ACT nasal spray Place 2 sprays into both nostrils daily as needed for allergies. 02/23/16  Yes Mariel Aloe, MD  gabapentin (NEURONTIN) 100 MG capsule Take 1 capsule (100 mg total) by mouth at bedtime. 02/01/16  Yes Nicholas Lose, MD  hydrochlorothiazide (HYDRODIURIL) 12.5 MG tablet Take 1 tablet (12.5 mg total) by mouth daily. 10/18/15  Yes Mariel Aloe, MD  metoprolol tartrate (LOPRESSOR) 25 MG tablet Take 1 tablet (25 mg total) by mouth 2 (two) times daily. 05/10/16  Yes Mariel Aloe, MD  Multiple Vitamin (MULTIVITAMIN) capsule Take 1 capsule by mouth daily.   Yes Historical Provider, MD  omeprazole (PRILOSEC) 20 MG capsule Take 1 capsule (20 mg total) by mouth daily. 09/21/16  Yes Donnamae Jude, MD  sertraline (ZOLOFT) 50 MG tablet Take 1 tablet (50 mg total) by mouth daily. Patient taking differently: Take 50 mg by mouth at bedtime.  09/21/16  Yes Donnamae Jude, MD    Physical Exam: Vitals:   10/06/16 1832 10/06/16 1959 10/06/16 2004 10/06/16 2137  BP:   115/66   Pulse:   110   Resp:   22   Temp:   98.4 F (36.9 C)   TempSrc:   Oral   SpO2: (!) 86% 91% 91% 92%  Weight:      Height:          Constitutional: NAD, calm, comfortable Vitals:   10/06/16 1832 10/06/16 1959 10/06/16 2004 10/06/16 2137  BP:   115/66   Pulse:   110   Resp:   22   Temp:   98.4 F (36.9 C)   TempSrc:   Oral   SpO2: (!) 86% 91% 91% 92%  Weight:      Height:  Eyes: PERRL, lids and conjunctivae normal ENMT: Mucous membranes are slightly dry. Posterior pharynx clear of any exudate or lesions. Edentulous. Neck: normal appearance, supple Respiratory: Diminished bilaterally, intermittent wheezing, primarily on the left.  No crackles.  Normal respiratory effort. No accessory muscle use.  Cardiovascular: Normal rate, regular rhythm, no murmurs / rubs / gallops. No extremity edema. 2+ pedal pulses.  GI: abdomen is soft and compressible.  No distention.  No tenderness.  No masses palpated.  Bowel sounds are present. Musculoskeletal:  No joint deformity in upper and lower extremities. Good ROM, no contractures. Normal muscle tone.  Skin: no rashes, warm and dry Neurologic: No focal deficits. Psychiatric: Normal judgment and insight. Alert and oriented x 3. Normal mood.     Labs on Admission: I have personally reviewed following labs and imaging studies  CBC:  Recent Labs Lab 10/06/16 1249  WBC 8.9  HGB 11.7*  HCT 35.7*  MCV 79.2  PLT [id number]*   Basic Metabolic Panel:  Recent Labs Lab 10/06/16 1249  NA 139  K 3.8  CL 101  CO2 31  GLUCOSE 93  BUN 7  CREATININE 0.67  CALCIUM 9.2   GFR: Estimated Creatinine Clearance: 80.8 mL/min (by C-G formula based on SCr of 0.67 mg/dL).  Urine analysis:    Component Value Date/Time   COLORURINE YELLOW 10/06/2016 1423   APPEARANCEUR CLEAR 10/06/2016 1423   LABSPEC 1.011 10/06/2016 1423    PHURINE 6.0 10/06/2016 1423   GLUCOSEU NEGATIVE 10/06/2016 1423   HGBUR TRACE (A) 10/06/2016 1423   HGBUR small 05/13/2010 1505   BILIRUBINUR NEGATIVE 10/06/2016 1423   BILIRUBINUR NEG 12/29/2015 1444   KETONESUR NEGATIVE 10/06/2016 1423   PROTEINUR NEGATIVE 10/06/2016 1423   UROBILINOGEN 0.2 12/29/2015 1444   UROBILINOGEN 0.2 04/17/2015 1618   NITRITE NEGATIVE 10/06/2016 1423   LEUKOCYTESUR TRACE (A) 10/06/2016 1423    Radiological Exams on Admission: Dg Chest 2 View  Result Date: 10/06/2016 CLINICAL DATA:  Shortness of breath today, COPD, history breast cancer post RIGHT lumpectomy, hypertension, sarcoidosis, COPD, GERD EXAM: CHEST  2 VIEW COMPARISON:  01/14/2016 FINDINGS: Normal heart size, mediastinal contours, and pulmonary vascularity. Chronic interstitial thickening throughout both lungs greater at the lower RIGHT chest, likely related to history of sarcoidosis. Tiny RIGHT pleural effusion, new. No definite acute infiltrate, LEFT pleural effusion, or pneumothorax. Surgical clips RIGHT breast and RIGHT axilla. No acute osseous findings. IMPRESSION: Chronic interstitial lung disease likely related to history of sarcoidosis, unchanged. Tiny new RIGHT pleural effusion. Electronically Signed   By: Lavonia Dana M.D.   On: 10/06/2016 13:11   Ct Angio Chest Pe W Or Wo Contrast  Result Date: 10/06/2016 CLINICAL DATA:  55 year old female with sarcoidosis presents with worsening shortness of breath and elevated D-dimer. EXAM: CT ANGIOGRAPHY CHEST WITH CONTRAST TECHNIQUE: Multidetector CT imaging of the chest was performed using the standard protocol during bolus administration of intravenous contrast. Multiplanar CT image reconstructions and MIPs were obtained to evaluate the vascular anatomy. CONTRAST:  100 cc Isovue-300 IV. COMPARISON:  Chest radiograph from earlier today. 02/08/2011 chest CT angiogram. FINDINGS: Cardiovascular: The study is moderate quality for the evaluation of pulmonary  embolism, with evaluation of the segmental and subsegmental pulmonary artery branches degraded by motion. There are no filling defects in the central, lobar, segmental or subsegmental pulmonary artery branches to suggest acute pulmonary embolism. Atherosclerotic nonaneurysmal thoracic aorta. Dilated main pulmonary artery (3.5 cm diameter), stable since 2012. Normal heart size. No significant pericardial fluid/thickening. Mediastinum/Nodes: No discrete thyroid nodules.  Unremarkable esophagus. Surgical clips are noted in the right axilla. No axillary adenopathy. Mildly enlarged 1.0 cm AP window node (series 4/image 52), previously 0.9 cm, not definitely changed. No additional pathologically enlarged mediastinal or hilar nodes. Lungs/Pleura: No pneumothorax. Small right pleural effusion. Apical left upper lobe 6 mm pulmonary nodule (series 11/ image 19) is stable since 02/08/2011 and considered benign. Lingular 9 mm solid pulmonary nodule (series 11/ image 62), previously 8 mm, not significantly changed, considered benign. No acute consolidative airspace disease or new significant pulmonary nodules. There is extensive patchy ground-glass attenuation and interlobular septal thickening throughout both lungs, most prominent in the upper lobes, increased since 02/08/2011. There is mild thickening of the central peribronchovascular interstitium and fine nodularity of the peripheral peribronchovascular interstitium and subpleural and perifissural lungs bilaterally, not convincingly changed since 2012. There are patchy regions of parenchymal banding and perilobular lines with associated mild traction bronchiolectasis, distortion and volume loss in both lungs, not convincingly changed. Upper abdomen: Partially visualized mild gastrohepatic ligament lymphadenopathy measuring up to 1.5 cm (series 4/image 98), stable. Partially visualized left upper para-aortic adenopathy measuring up to 1.5 cm (series 4/ image 104), stable.  Musculoskeletal: No aggressive appearing focal osseous lesions. Mild thoracic spondylosis. Review of the MIP images confirms the above findings. IMPRESSION: 1. Motion degraded scan.  No evidence of pulmonary embolism. 2. Spectrum of findings consistent with pulmonary sarcoidosis, including central peribronchovascular interstitial thickening, fine perilymphatic distribution nodularity, interlobular septal thickening, patchy ground-glass attenuation and scattered areas of fibrosis, mildly progressed since 02/08/2011, particularly the ground-glass attenuation and interlobular septal thickening. 3. Small right pleural effusion. 4. Mild mediastinal and upper retroperitoneal lymphadenopathy, stable, consistent with sarcoidosis. 5. Stable dilated main pulmonary artery, suggesting chronic pulmonary arterial hypertension. 6. Aortic atherosclerosis. Electronically Signed   By: Ilona Sorrel M.D.   On: 10/06/2016 17:56    EKG: Independently reviewed. NSR.  No acute ST segment changes.  Assessment/Plan Principal Problem:   COPD exacerbation (HCC) Active Problems:   Sarcoidosis ([place])   HYPERTENSION, BENIGN SYSTEMIC   Depression with anxiety      COPD exacerbation with acute hypoxic respiratory failure complicated by known history of sarcoidosis with mildly progressive changes on CT scan of her chest. --Place in observation with telemetry monitoring and continuous pulse oximetry for now --Solumedrol 60mg  IV q6h --Duoneb q4h while awake and prn --Antibiotics deferred for now --incentive spirometry --Attempt to wean oxygen as tolerated --document ambulatory O2 sat in the AM --Based on response to initial therapies, consider pulmonary consult while here or refer back to Dr. Melvyn Novas at discharge --Continue formulary substitute for Symbicort --COPD gold pathway also activated  Atypical chest discomfort in the setting of hypoxia --Low index of suspicion for ACS at this point --No acute EKG changes; telemetry  monitoring --First troponin in the ED negative; will repeat and look for trend --She is on baby aspirin at baseline  HTN --Continue metoprolol, HCTZ  GERD --Continue PPI  History of breast cancer --Continue Arimidex  Depression --Continue Zoloft  DVT prophylaxis: SCDs Code Status: FULL Family Communication: Patient alone in the ED at time of admission. Disposition Plan: Expect she will go home. Consults called: None Admission status: Place in observation with telemetry monitoring and continuous pulse oximetry.   TIME SPENT: 60 minutes   Eber Jones MD [facility] Pager [phone]  If 7PM-7AM, please contact night-coverage [email or web address] Password [facility]  10/06/2016, 9:45 PM

## 2016-10-06 NOTE — ED Triage Notes (Signed)
Pt complaint of worsening SOB, weakness, and headache for a week. Pt denies CP, cough, or fever.

## 2016-10-07 DIAGNOSIS — Z9981 Dependence on supplemental oxygen: Secondary | ICD-10-CM | POA: Diagnosis not present

## 2016-10-07 DIAGNOSIS — R262 Difficulty in walking, not elsewhere classified: Secondary | ICD-10-CM

## 2016-10-07 DIAGNOSIS — J90 Pleural effusion, not elsewhere classified: Secondary | ICD-10-CM | POA: Diagnosis present

## 2016-10-07 DIAGNOSIS — D869 Sarcoidosis, unspecified: Secondary | ICD-10-CM | POA: Diagnosis not present

## 2016-10-07 DIAGNOSIS — Z87891 Personal history of nicotine dependence: Secondary | ICD-10-CM | POA: Diagnosis not present

## 2016-10-07 DIAGNOSIS — Z803 Family history of malignant neoplasm of breast: Secondary | ICD-10-CM | POA: Diagnosis not present

## 2016-10-07 DIAGNOSIS — J449 Chronic obstructive pulmonary disease, unspecified: Secondary | ICD-10-CM | POA: Diagnosis not present

## 2016-10-07 DIAGNOSIS — D860 Sarcoidosis of lung: Secondary | ICD-10-CM | POA: Diagnosis present

## 2016-10-07 DIAGNOSIS — Z90710 Acquired absence of both cervix and uterus: Secondary | ICD-10-CM | POA: Diagnosis not present

## 2016-10-07 DIAGNOSIS — F418 Other specified anxiety disorders: Secondary | ICD-10-CM | POA: Diagnosis not present

## 2016-10-07 DIAGNOSIS — D573 Sickle-cell trait: Secondary | ICD-10-CM | POA: Diagnosis present

## 2016-10-07 DIAGNOSIS — Z8249 Family history of ischemic heart disease and other diseases of the circulatory system: Secondary | ICD-10-CM | POA: Diagnosis not present

## 2016-10-07 DIAGNOSIS — Z853 Personal history of malignant neoplasm of breast: Secondary | ICD-10-CM | POA: Diagnosis not present

## 2016-10-07 DIAGNOSIS — K219 Gastro-esophageal reflux disease without esophagitis: Secondary | ICD-10-CM | POA: Diagnosis present

## 2016-10-07 DIAGNOSIS — Z7982 Long term (current) use of aspirin: Secondary | ICD-10-CM | POA: Diagnosis not present

## 2016-10-07 DIAGNOSIS — Z79899 Other long term (current) drug therapy: Secondary | ICD-10-CM | POA: Diagnosis not present

## 2016-10-07 DIAGNOSIS — I2721 Secondary pulmonary arterial hypertension: Secondary | ICD-10-CM | POA: Diagnosis not present

## 2016-10-07 DIAGNOSIS — J80 Acute respiratory distress syndrome: Secondary | ICD-10-CM | POA: Diagnosis not present

## 2016-10-07 DIAGNOSIS — J9621 Acute and chronic respiratory failure with hypoxia: Secondary | ICD-10-CM | POA: Diagnosis not present

## 2016-10-07 DIAGNOSIS — J441 Chronic obstructive pulmonary disease with (acute) exacerbation: Secondary | ICD-10-CM | POA: Diagnosis not present

## 2016-10-07 DIAGNOSIS — R0602 Shortness of breath: Secondary | ICD-10-CM | POA: Diagnosis present

## 2016-10-07 DIAGNOSIS — Z9011 Acquired absence of right breast and nipple: Secondary | ICD-10-CM | POA: Diagnosis not present

## 2016-10-07 DIAGNOSIS — Z825 Family history of asthma and other chronic lower respiratory diseases: Secondary | ICD-10-CM | POA: Diagnosis not present

## 2016-10-07 DIAGNOSIS — Z6828 Body mass index (BMI) 28.0-28.9, adult: Secondary | ICD-10-CM | POA: Diagnosis not present

## 2016-10-07 DIAGNOSIS — J9601 Acute respiratory failure with hypoxia: Secondary | ICD-10-CM | POA: Diagnosis not present

## 2016-10-07 DIAGNOSIS — Z79811 Long term (current) use of aromatase inhibitors: Secondary | ICD-10-CM | POA: Diagnosis not present

## 2016-10-07 DIAGNOSIS — I10 Essential (primary) hypertension: Secondary | ICD-10-CM | POA: Diagnosis not present

## 2016-10-07 DIAGNOSIS — E440 Moderate protein-calorie malnutrition: Secondary | ICD-10-CM | POA: Diagnosis not present

## 2016-10-07 LAB — CBC
HEMATOCRIT: 34.3 % — AB (ref 36.0–46.0)
Hemoglobin: 11 g/dL — ABNORMAL LOW (ref 12.0–15.0)
MCH: 25.7 pg — AB (ref 26.0–34.0)
MCHC: 32.1 g/dL (ref 30.0–36.0)
MCV: 80.1 fL (ref 78.0–100.0)
Platelets: 350 10*3/uL (ref 150–400)
RBC: 4.28 MIL/uL (ref 3.87–5.11)
RDW: 15.3 % (ref 11.5–15.5)
WBC: 8.1 10*3/uL (ref 4.0–10.5)

## 2016-10-07 LAB — TROPONIN I: Troponin I: <0.03 ng/mL (ref <0.03)

## 2016-10-07 LAB — COMPREHENSIVE METABOLIC PANEL
ALT: 10 U/L — ABNORMAL LOW (ref 14–54)
ANION GAP: 5 (ref 5–15)
AST: 14 U/L — ABNORMAL LOW (ref 15–41)
Albumin: 3.3 g/dL — ABNORMAL LOW (ref 3.5–5.0)
Alkaline Phosphatase: 70 U/L (ref 38–126)
BILIRUBIN TOTAL: 0.6 mg/dL (ref 0.3–1.2)
BUN: 12 mg/dL (ref 6–20)
CO2: 30 mmol/L (ref 22–32)
Calcium: 8.8 mg/dL — ABNORMAL LOW (ref 8.9–10.3)
Chloride: 105 mmol/L (ref 101–111)
Creatinine, Ser: 0.72 mg/dL (ref 0.44–1.00)
Glucose, Bld: 171 mg/dL — ABNORMAL HIGH (ref 65–99)
POTASSIUM: 4.3 mmol/L (ref 3.5–5.1)
Sodium: 140 mmol/L (ref 135–145)
TOTAL PROTEIN: 7.6 g/dL (ref 6.5–8.1)

## 2016-10-07 MED ORDER — IPRATROPIUM-ALBUTEROL 0.5-2.5 (3) MG/3ML IN SOLN
3.0000 mL | Freq: Two times a day (BID) | RESPIRATORY_TRACT | Status: DC
  Administered 2016-10-07 – 2016-10-10 (×6): 3 mL via RESPIRATORY_TRACT
  Filled 2016-10-07 (×6): qty 3

## 2016-10-07 NOTE — Progress Notes (Signed)
SATURATION QUALIFICATIONS: (This note is used to comply with regulatory documentation for home oxygen)  Patient Saturations on Room Air at Rest = 87%  Patient Saturations on Room Air while Ambulating = NT  Patient Saturations on 2 Liters of oxygen while Ambulating = 87%  Please briefly explain why patient needs home oxygen: Pt de-sats on room air

## 2016-10-07 NOTE — Progress Notes (Signed)
PROGRESS NOTE    [name]  [id number] DOB: 11/09/1961 DOA: 10/06/2016 PCP: Melina Schools, DO    Brief Narrative:  55 y.o. woman with a history of COPD, pulmonary sarcoidosis, HTN, breast cancer, and depression who presents to the ED for evaluation of progressive shortness of breath at rest and dyspnea on exertion.  Symptoms have been progressive over the past 1-2 weeks.  She has had intermittent cough with thick white sputum production (primarily in the mornings).  She denies fevers, chills, or sweats.  No light-headedness or syncope.  No swelling.  She has not taken anything over the counter or received any prescriptions as an outpatient for these symptoms.  She uses her Symbicort inhaler twice daily and she has an albuterol rescue inhaler at baseline  Assessment & Plan:   Principal Problem:   COPD exacerbation ([place]) Active Problems:   Sarcoidosis ([place])   HYPERTENSION, BENIGN SYSTEMIC   Depression with anxiety   COPD exacerbation with acute hypoxic respiratory failure complicated by known history of sarcoidosis with mildly progressive changes on CT scan of her chest. -Patient reports improvement overnight. -Patient remains hypoxic, descending to the 60s on ambulation in the room -No audible wheezing auscultated on my exam today, however patient does have decreased breath sounds throughout -Continue scheduled IV steroids, scheduled bronchodilator treatment -Continue Symbicort -Continue to wean O2 as tolerated  Atypical chest discomfort in the setting of hypoxia -Chest pain-free at the present -Labs reviewed, serial troponins are negative 3 -EKG reviewed, no skin changes noted -Suspect possible chest wall pain secondary to above COPD  HTN -Blood pressure currently stable -Continue metoprolol, HCTZ  GERD -Stable at present -Tinea PPI as tolerated  History of breast cancer --Continue Arimidex -Stable  Depression --Continue Zoloft -Stable at  present  DVT prophylaxis: SCDs Code Status: Full code Family Communication: Patient in room, family not at bedside Disposition Plan: Uncertain at this time  Consultants:     Procedures:     Antimicrobials: Anti-infectives    None      Subjective: Feeling better today  Objective: Vitals:   10/07/16 0504 10/07/16 0855 10/07/16 0858 10/07/16 1405  BP: 132/70   133/68  Pulse: 67   64  Resp: 14   20  Temp: 97.9 F (36.6 C)   98 F (36.7 C)  TempSrc: Oral   Oral  SpO2: 99% 99% 99% 95%  Weight:      Height:        Intake/Output Summary (Last 24 hours) at 10/07/16 1829 Last data filed at 10/07/16 0200  Gross per 24 hour  Intake              255 ml  Output                0 ml  Net              255 ml   Filed Weights   10/06/16 1238  Weight: 77.6 kg (171 lb)    Examination:  General exam: Appears calm and comfortable  Respiratory system: Normal respiratory effort, no audible wheezing, decreased breath sounds throughout Cardiovascular system: S1 & S2 heard, RRR. Gastrointestinal system: Abdomen is nondistended, soft and nontender. No organomegaly or masses felt. Normal bowel sounds heard. Central nervous system: Alert and oriented. No focal neurological deficits. Extremities: Symmetric 5 x 5 power. Skin: No rashes, lesions Psychiatry: Judgement and insight appear normal. Mood & affect appropriate.   Reviewed: I have personally reviewed following labs and imaging studies  CBC:  Recent Labs Lab 10/06/16 1249 10/07/16 0522  WBC 8.9 8.1  HGB 11.7* 11.0*  HCT 35.7* 34.3*  MCV 79.2 80.1  PLT 401* [id number]   Basic Metabolic Panel:  Recent Labs Lab 10/06/16 1249 10/07/16 0522  NA 139 140  K 3.8 4.3  CL 101 105  CO2 31 30  GLUCOSE 93 171*  BUN 7 12  CREATININE 0.67 0.72  CALCIUM 9.2 8.8*   GFR: Estimated Creatinine Clearance: 80.8 mL/min (by C-G formula based on SCr of 0.72 mg/dL). Liver Function Tests:  Recent Labs Lab 10/07/16 0522  AST 14*   ALT 10*  ALKPHOS 70  BILITOT 0.6  PROT 7.6  ALBUMIN 3.3*   No results for input(s): LIPASE, AMYLASE in the last 168 hours. No results for input(s): AMMONIA in the last 168 hours. Coagulation Profile: No results for input(s): INR, PROTIME in the last 168 hours. Cardiac Enzymes:  Recent Labs Lab 10/06/16 2317 10/07/16 0522 10/07/16 1015  TROPONINI <0.03 <0.03 <0.03   BNP (last 3 results) No results for input(s): PROBNP in the last 8760 hours. HbA1C: No results for input(s): HGBA1C in the last 72 hours. CBG: No results for input(s): GLUCAP in the last 168 hours. Lipid Profile: No results for input(s): CHOL, HDL, LDLCALC, TRIG, CHOLHDL, LDLDIRECT in the last 72 hours. Thyroid Function Tests: No results for input(s): TSH, T4TOTAL, FREET4, T3FREE, THYROIDAB in the last 72 hours. Anemia Panel: No results for input(s): VITAMINB12, FOLATE, FERRITIN, TIBC, IRON, RETICCTPCT in the last 72 hours. Sepsis Labs: No results for input(s): PROCALCITON, LATICACIDVEN in the last 168 hours.  No results found for this or any previous visit (from the past 240 hour(s)).   Radiology Studies: Dg Chest 2 View  Result Date: 10/06/2016 CLINICAL DATA:  Shortness of breath today, COPD, history breast cancer post RIGHT lumpectomy, hypertension, sarcoidosis, COPD, GERD EXAM: CHEST  2 VIEW COMPARISON:  01/14/2016 FINDINGS: Normal heart size, mediastinal contours, and pulmonary vascularity. Chronic interstitial thickening throughout both lungs greater at the lower RIGHT chest, likely related to history of sarcoidosis. Tiny RIGHT pleural effusion, new. No definite acute infiltrate, LEFT pleural effusion, or pneumothorax. Surgical clips RIGHT breast and RIGHT axilla. No acute osseous findings. IMPRESSION: Chronic interstitial lung disease likely related to history of sarcoidosis, unchanged. Tiny new RIGHT pleural effusion. Electronically Signed   By: Lavonia Dana M.D.   On: 10/06/2016 13:11   Ct Angio Chest Pe  W Or Wo Contrast  Result Date: 10/06/2016 CLINICAL DATA:  55 year old female with sarcoidosis presents with worsening shortness of breath and elevated D-dimer. EXAM: CT ANGIOGRAPHY CHEST WITH CONTRAST TECHNIQUE: Multidetector CT imaging of the chest was performed using the standard protocol during bolus administration of intravenous contrast. Multiplanar CT image reconstructions and MIPs were obtained to evaluate the vascular anatomy. CONTRAST:  100 cc Isovue-300 IV. COMPARISON:  Chest radiograph from earlier today. 02/08/2011 chest CT angiogram. FINDINGS: Cardiovascular: The study is moderate quality for the evaluation of pulmonary embolism, with evaluation of the segmental and subsegmental pulmonary artery branches degraded by motion. There are no filling defects in the central, lobar, segmental or subsegmental pulmonary artery branches to suggest acute pulmonary embolism. Atherosclerotic nonaneurysmal thoracic aorta. Dilated main pulmonary artery (3.5 cm diameter), stable since 2012. Normal heart size. No significant pericardial fluid/thickening. Mediastinum/Nodes: No discrete thyroid nodules. Unremarkable esophagus. Surgical clips are noted in the right axilla. No axillary adenopathy. Mildly enlarged 1.0 cm AP window node (series 4/image 52), previously 0.9 cm, not definitely changed. No additional pathologically  enlarged mediastinal or hilar nodes. Lungs/Pleura: No pneumothorax. Small right pleural effusion. Apical left upper lobe 6 mm pulmonary nodule (series 11/ image 19) is stable since 02/08/2011 and considered benign. Lingular 9 mm solid pulmonary nodule (series 11/ image 62), previously 8 mm, not significantly changed, considered benign. No acute consolidative airspace disease or new significant pulmonary nodules. There is extensive patchy ground-glass attenuation and interlobular septal thickening throughout both lungs, most prominent in the upper lobes, increased since 02/08/2011. There is mild  thickening of the central peribronchovascular interstitium and fine nodularity of the peripheral peribronchovascular interstitium and subpleural and perifissural lungs bilaterally, not convincingly changed since 2012. There are patchy regions of parenchymal banding and perilobular lines with associated mild traction bronchiolectasis, distortion and volume loss in both lungs, not convincingly changed. Upper abdomen: Partially visualized mild gastrohepatic ligament lymphadenopathy measuring up to 1.5 cm (series 4/image 98), stable. Partially visualized left upper para-aortic adenopathy measuring up to 1.5 cm (series 4/ image 104), stable. Musculoskeletal: No aggressive appearing focal osseous lesions. Mild thoracic spondylosis. Review of the MIP images confirms the above findings. IMPRESSION: 1. Motion degraded scan.  No evidence of pulmonary embolism. 2. Spectrum of findings consistent with pulmonary sarcoidosis, including central peribronchovascular interstitial thickening, fine perilymphatic distribution nodularity, interlobular septal thickening, patchy ground-glass attenuation and scattered areas of fibrosis, mildly progressed since 02/08/2011, particularly the ground-glass attenuation and interlobular septal thickening. 3. Small right pleural effusion. 4. Mild mediastinal and upper retroperitoneal lymphadenopathy, stable, consistent with sarcoidosis. 5. Stable dilated main pulmonary artery, suggesting chronic pulmonary arterial hypertension. 6. Aortic atherosclerosis. Electronically Signed   By: Ilona Sorrel M.D.   On: 10/06/2016 17:56    Scheduled Meds: . anastrozole  1 mg Oral QHS  . aspirin EC  81 mg Oral Daily  . feeding supplement (ENSURE ENLIVE)  237 mL Oral BID BM  . gabapentin  100 mg Oral QHS  . hydrochlorothiazide  12.5 mg Oral Daily  . ipratropium-albuterol  3 mL Nebulization BID  . mouth rinse  15 mL Mouth Rinse q12n4p  . methylPREDNISolone (SOLU-MEDROL) injection  60 mg Intravenous Q6H   . metoprolol tartrate  25 mg Oral BID  . mometasone-formoterol  2 puff Inhalation BID  . pantoprazole  40 mg Oral Daily  . sertraline  50 mg Oral QHS  . sodium chloride flush  3 mL Intravenous Q12H   Continuous Infusions:   LOS: 0 days   CHIU, Orpah Melter, MD [facility] Pager [phone]  If 7PM-7AM, please contact night-coverage [email or web address] Password [facility] 10/07/2016, 6:29 PM

## 2016-10-07 NOTE — Progress Notes (Signed)
Pt desated to 68 ambulating in room on RA.

## 2016-10-07 NOTE — Evaluation (Signed)
Physical Therapy Evaluation Patient Details Name: [name] MRN: [id number] DOB: [birth year] Today's Date: 10/07/2016   History of Present Illness  [name] is a 55 y.o. woman with a history of COPD, pulmonary sarcoidosis, HTN, breast cancer, and depression who presents to the ED for evaluation of progressive shortness of breath at rest and dyspnea on exertion.  Clinical Impression  Pt admitted with above diagnosis. Pt currently with functional limitations due to the deficits listed below (see PT Problem List). Pt will benefit from skilled PT to increase their independence and safety with mobility to allow discharge to the venue listed below.  Pt a bit wobbly initially with gait due to not being out of bed much, but became steadier as gait progressed.  o2 sat dropped to 87% on 2 L/min.  Will follow acutely, but do not feel she will need PT in post acute setting.     Follow Up Recommendations No PT follow up    Equipment Recommendations  None recommended by PT    Recommendations for Other Services       Precautions / Restrictions Precautions Precautions: Other (comment) Precaution Comments: monitor o2 sats      Mobility  Bed Mobility Overal bed mobility: Modified Independent                Transfers Overall transfer level: Modified independent                  Ambulation/Gait Ambulation/Gait assistance: Min guard;Supervision Ambulation Distance (Feet): 120 Feet Assistive device: None Gait Pattern/deviations: Step-through pattern     General Gait Details: o2 on 2 L/min dropped to 87%. Educated on pursed lip breathing.  Increased to 89%.  O2 up to 3 L/min and then o2 88-89% with remainder of gait. Initially a little unsteady due to being in the bed, but by end of gait she was steadier.  Stairs            Wheelchair Mobility    Modified Rankin (Stroke Patients Only)       Balance Overall balance assessment: No apparent balance deficits  (not formally assessed)                                           Pertinent Vitals/Pain Pain Assessment: No/denies pain    Home Living Family/patient expects to be discharged to:: Private residence Living Arrangements: Alone Available Help at Discharge: Family;Available PRN/intermittently Type of Home: Apartment Home Access: Stairs to enter Entrance Stairs-Rails: Right Entrance Stairs-Number of Steps: 5 Home Layout: One level Home Equipment: None      Prior Function Level of Independence: Independent         Comments: 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[place] [employer]        Extremity/Trunk Assessment   Upper Extremity Assessment: Overall WFL for tasks assessed           Lower Extremity Assessment: Overall WFL for tasks assessed         Communication   Communication: No difficulties  Cognition Arousal/Alertness: Awake/alert Behavior During Therapy: WFL for tasks assessed/performed Overall Cognitive Status: Within Functional Limits for tasks assessed                      General Comments      Exercises  Assessment/Plan    PT Assessment Patient needs continued PT services  PT Problem List Decreased activity tolerance;Cardiopulmonary status limiting activity;Decreased mobility          PT Treatment Interventions Gait training;Stair training;Functional mobility training;Therapeutic activities;Therapeutic exercise    PT Goals (Current goals can be found in the Care Plan section)  Acute Rehab PT Goals Patient Stated Goal: return to prior level PT Goal Formulation: With patient Time For Goal Achievement: 10/14/16 Potential to Achieve Goals: Good    Frequency Min 3X/week   Barriers to discharge        Co-evaluation               End of Session Equipment Utilized During Treatment: Gait belt;Oxygen Activity Tolerance: Treatment limited secondary to medical complications  (Comment) (de-sat) Patient left: in chair;with call bell/phone within reach Nurse Communication: Mobility status    Functional Assessment Tool Used: clinical judgement and objective findings Functional Limitation: Mobility: Walking and moving around Mobility: Walking and Moving Around Current Status [id number]): At least 1 percent but less than 20 percent impaired, limited or restricted Mobility: Walking and Moving Around Goal Status [phone]): At least 1 percent but less than 20 percent impaired, limited or restricted    Time: [id number] (no charge for MD visit mid session)-1155 PT Time Calculation (min) (ACUTE ONLY): 31 min   Charges:   PT Evaluation $PT Eval Low Complexity: 1 Procedure     PT G Codes:   PT G-Codes **NOT FOR INPATIENT CLASS** Functional Assessment Tool Used: clinical judgement and objective findings Functional Limitation: Mobility: Walking and moving around Mobility: Walking and Moving Around Current Status [id number]): At least 1 percent but less than 20 percent impaired, limited or restricted Mobility: Walking and Moving Around Goal Status [phone]): At least 1 percent but less than 20 percent impaired, limited or restricted    [facility] 10/07/2016, 1:10 PM

## 2016-10-07 NOTE — Progress Notes (Signed)
OT Cancellation Note  Patient Details Name: [name] MRN: [id number] DOB: 1961-10-20   Cancelled Treatment:    Reason Eval/Treat Not Completed: OT screened, no needs identified, will sign off -- Per discussion with PT, pt independent with ADLs. No OT needs at this time. Will sign off.  Mykell Genao A 10/07/2016, 11:55 AM

## 2016-10-08 NOTE — Progress Notes (Signed)
PROGRESS NOTE    [name]  [id number] DOB: [birth year] DOA: 10/06/2016 PCP: Melina Schools, DO    Brief Narrative:  55 y.o. woman with a history of COPD, pulmonary sarcoidosis, HTN, breast cancer, and depression who presents to the ED for evaluation of progressive shortness of breath at rest and dyspnea on exertion.  Symptoms have been progressive over the past 1-2 weeks.  She has had intermittent cough with thick white sputum production (primarily in the mornings).  She denies fevers, chills, or sweats.  No light-headedness or syncope.  No swelling.  She has not taken anything over the counter or received any prescriptions as an outpatient for these symptoms.  She uses her Symbicort inhaler twice daily and she has an albuterol rescue inhaler at baseline  Assessment & Plan:   Principal Problem:   COPD exacerbation ([place]) Active Problems:   Sarcoidosis ([place])   HYPERTENSION, BENIGN SYSTEMIC   Depression with anxiety   Difficulty in walking, not elsewhere classified   COPD exacerbation with acute hypoxic respiratory failure complicated by known history of sarcoidosis with mildly progressive changes on CT scan of her chest. -Patient reports feeling improved -Lungs sound clear, no audible wheezing. -At rest, patient able to maintain O2 sats over 94%, however with minimal exertion across room, patient noted to desat to the upper 80s even on supplemental O2 at 2 L -Patient is continued on IV steroids, scheduled bronchodilators and incentive spirometry  Atypical chest discomfort in the setting of hypoxia -Remains chest pain-free -Labs reviewed, serial troponins are negative 3 -EKG reviewed, no skin changes noted -Likely chest wall pain secondary to above COPD  HTN -Blood pressure presently remains stable -Continue metoprolol, HCTZ  GERD -Stable at present -Tinea PPI as tolerated  History of breast cancer --Continue Arimidex Main stable at  present  Depression --Continue Zoloft Fairly stable  DVT prophylaxis: SCDs Code Status: Full code Family Communication: Patient in room, family not at bedside Disposition Plan: Uncertain at this time, anticipate when able to wean off O2 if possible  Consultants:     Procedures:     Antimicrobials: Anti-infectives    None      Subjective: Reports feeling somewhat better today  Objective: Vitals:   10/08/16 0851 10/08/16 0852 10/08/16 1053 10/08/16 1434  BP:   (!) 141/71 (!) 155/79  Pulse:   65 64  Resp:    18  Temp:    98.3 F (36.8 C)  TempSrc:    Oral  SpO2: 96% 96%  98%  Weight:      Height:       No intake or output data in the 24 hours ending 10/08/16 1619 Filed Weights   10/06/16 1238  Weight: 77.6 kg (171 lb)    Examination:  General exam: Lying in bed, no acute distress  Respiratory system: Normal chest rise, no audible wheezing, clear Cardiovascular system: Regular rate, S1-S2 Gastrointestinal system: Positive bowel sounds, nondistended Central nervous system: CN II through XII grossly intact, sensation intact Extremities: Perfused, no clubbing Skin: Normal skin turgor, no notable skin lesions seen Psychiatry: Mood normal, no visual hallucinations   Reviewed: I have personally reviewed following labs and imaging studies  CBC:  Recent Labs Lab 10/06/16 1249 10/07/16 0522  WBC 8.9 8.1  HGB 11.7* 11.0*  HCT 35.7* 34.3*  MCV 79.2 80.1  PLT 401* [id number]   Basic Metabolic Panel:  Recent Labs Lab 10/06/16 1249 10/07/16 0522  NA 139 140  K 3.8 4.3  CL 101  105  CO2 31 30  GLUCOSE 93 171*  BUN 7 12  CREATININE 0.67 0.72  CALCIUM 9.2 8.8*   GFR: Estimated Creatinine Clearance: 80.8 mL/min (by C-G formula based on SCr of 0.72 mg/dL). Liver Function Tests:  Recent Labs Lab 10/07/16 0522  AST 14*  ALT 10*  ALKPHOS 70  BILITOT 0.6  PROT 7.6  ALBUMIN 3.3*   No results for input(s): LIPASE, AMYLASE in the last 168 hours. No  results for input(s): AMMONIA in the last 168 hours. Coagulation Profile: No results for input(s): INR, PROTIME in the last 168 hours. Cardiac Enzymes:  Recent Labs Lab 10/06/16 2317 10/07/16 0522 10/07/16 1015  TROPONINI <0.03 <0.03 <0.03   BNP (last 3 results) No results for input(s): PROBNP in the last 8760 hours. HbA1C: No results for input(s): HGBA1C in the last 72 hours. CBG: No results for input(s): GLUCAP in the last 168 hours. Lipid Profile: No results for input(s): CHOL, HDL, LDLCALC, TRIG, CHOLHDL, LDLDIRECT in the last 72 hours. Thyroid Function Tests: No results for input(s): TSH, T4TOTAL, FREET4, T3FREE, THYROIDAB in the last 72 hours. Anemia Panel: No results for input(s): VITAMINB12, FOLATE, FERRITIN, TIBC, IRON, RETICCTPCT in the last 72 hours. Sepsis Labs: No results for input(s): PROCALCITON, LATICACIDVEN in the last 168 hours.  No results found for this or any previous visit (from the past 240 hour(s)).   Radiology Studies: Ct Angio Chest Pe W Or Wo Contrast  Result Date: 10/06/2016 CLINICAL DATA:  55 year old female with sarcoidosis presents with worsening shortness of breath and elevated D-dimer. EXAM: CT ANGIOGRAPHY CHEST WITH CONTRAST TECHNIQUE: Multidetector CT imaging of the chest was performed using the standard protocol during bolus administration of intravenous contrast. Multiplanar CT image reconstructions and MIPs were obtained to evaluate the vascular anatomy. CONTRAST:  100 cc Isovue-300 IV. COMPARISON:  Chest radiograph from earlier today. 02/08/2011 chest CT angiogram. FINDINGS: Cardiovascular: The study is moderate quality for the evaluation of pulmonary embolism, with evaluation of the segmental and subsegmental pulmonary artery branches degraded by motion. There are no filling defects in the central, lobar, segmental or subsegmental pulmonary artery branches to suggest acute pulmonary embolism. Atherosclerotic nonaneurysmal thoracic aorta.  Dilated main pulmonary artery (3.5 cm diameter), stable since 2012. Normal heart size. No significant pericardial fluid/thickening. Mediastinum/Nodes: No discrete thyroid nodules. Unremarkable esophagus. Surgical clips are noted in the right axilla. No axillary adenopathy. Mildly enlarged 1.0 cm AP window node (series 4/image 52), previously 0.9 cm, not definitely changed. No additional pathologically enlarged mediastinal or hilar nodes. Lungs/Pleura: No pneumothorax. Small right pleural effusion. Apical left upper lobe 6 mm pulmonary nodule (series 11/ image 19) is stable since 02/08/2011 and considered benign. Lingular 9 mm solid pulmonary nodule (series 11/ image 62), previously 8 mm, not significantly changed, considered benign. No acute consolidative airspace disease or new significant pulmonary nodules. There is extensive patchy ground-glass attenuation and interlobular septal thickening throughout both lungs, most prominent in the upper lobes, increased since 02/08/2011. There is mild thickening of the central peribronchovascular interstitium and fine nodularity of the peripheral peribronchovascular interstitium and subpleural and perifissural lungs bilaterally, not convincingly changed since 2012. There are patchy regions of parenchymal banding and perilobular lines with associated mild traction bronchiolectasis, distortion and volume loss in both lungs, not convincingly changed. Upper abdomen: Partially visualized mild gastrohepatic ligament lymphadenopathy measuring up to 1.5 cm (series 4/image 98), stable. Partially visualized left upper para-aortic adenopathy measuring up to 1.5 cm (series 4/ image 104), stable. Musculoskeletal: No aggressive appearing focal  osseous lesions. Mild thoracic spondylosis. Review of the MIP images confirms the above findings. IMPRESSION: 1. Motion degraded scan.  No evidence of pulmonary embolism. 2. Spectrum of findings consistent with pulmonary sarcoidosis, including  central peribronchovascular interstitial thickening, fine perilymphatic distribution nodularity, interlobular septal thickening, patchy ground-glass attenuation and scattered areas of fibrosis, mildly progressed since 02/08/2011, particularly the ground-glass attenuation and interlobular septal thickening. 3. Small right pleural effusion. 4. Mild mediastinal and upper retroperitoneal lymphadenopathy, stable, consistent with sarcoidosis. 5. Stable dilated main pulmonary artery, suggesting chronic pulmonary arterial hypertension. 6. Aortic atherosclerosis. Electronically Signed   By: Ilona Sorrel M.D.   On: 10/06/2016 17:56    Scheduled Meds: . anastrozole  1 mg Oral QHS  . aspirin EC  81 mg Oral Daily  . feeding supplement (ENSURE ENLIVE)  237 mL Oral BID BM  . gabapentin  100 mg Oral QHS  . hydrochlorothiazide  12.5 mg Oral Daily  . ipratropium-albuterol  3 mL Nebulization BID  . mouth rinse  15 mL Mouth Rinse q12n4p  . methylPREDNISolone (SOLU-MEDROL) injection  60 mg Intravenous Q6H  . metoprolol tartrate  25 mg Oral BID  . mometasone-formoterol  2 puff Inhalation BID  . pantoprazole  40 mg Oral Daily  . sertraline  50 mg Oral QHS  . sodium chloride flush  3 mL Intravenous Q12H   Continuous Infusions:   LOS: 1 day   Ula Couvillon, Orpah Melter, MD [facility] Pager [phone]  If 7PM-7AM, please contact night-coverage [email or web address] Password [facility] 10/08/2016, 4:19 PM

## 2016-10-08 NOTE — Progress Notes (Signed)
Initial Nutrition Assessment  DOCUMENTATION CODES:   Non-severe (moderate) malnutrition in context of acute illness/injury  INTERVENTION:  Continue Ensure Enlive po BID, each supplement provides 350 kcal and 20 grams of protein.  Encouraged adequate intake of calories and protein through small, frequent meals and beverages. Discussed protein options patient will enjoy and that will be easy to prepare in setting of fatigue.   NUTRITION DIAGNOSIS:   Increased nutrient needs related to catabolic illness (COPD exacerbation) as evidenced by estimated needs.  GOAL:   Patient will meet greater than or equal to 90% of their needs  MONITOR:   PO intake, Supplement acceptance, Labs, Weight trends, I & O's  REASON FOR ASSESSMENT:   Consult Assessment of nutrition requirement/status  ASSESSMENT:   55 y.o.woman with a history of COPD, pulmonary sarcoidosis, HTN, breast cancer, and depression who presents to the ED for evaluation of progressive shortness of breath at rest and dyspnea on exertion. Found to have COPD exacerbation with acute hypoxic respiratory failure complicated by history of sarcoidosis.   -Regarding history of breast cancer, patient had mastectomy 08/18/2015. She subsequently received several cycles of radiation, but has not received any further treatment after that.  Spoke with patient at bedside. She reports poor appetite for about 1 month. Poor appetite mainly due to weakness but also endorses some nausea due to not eating enough food. Denies abdominal pain or constipation/diarrhea. Patient reports she has been eating much less than usual, with most days only having a banana at breakfast and maybe crackers with peanut butter later in the day. Some days she has not had anything to eat at all. Patient reports her mom passed away in Aug 23, 2023, which has also affected appetite.   UBW 180 lbs. Patient has lost 9 lbs (5% body weight) over almost 3 months, which is not  significant for time frame.  Medications reviewed and include: methylprednisolone 60 mg Q6hrs, pantoprazole.  Labs reviewed: Glucose 171, Albumin 3.3, AST 14, ALT 10.   Nutrition-Focused physical exam completed. Findings are no fat depletion, mild muscle depletion (temple region only), and no edema.   Patient meets criteria for moderate acute malnutrition in setting of intake <75% of estimated energy requirement for > 7 days and mild muscle depletion.    Diet Order:  Diet Heart Room service appropriate? Yes; Fluid consistency: Thin  Skin:  Reviewed, no issues  Last BM:  10/06/2016  Height:   Ht Readings from Last 1 Encounters:  10/06/16 5\' 5"  (1.651 m)    Weight:   Wt Readings from Last 1 Encounters:  10/06/16 171 lb (77.6 kg)    Ideal Body Weight:  56.82 kg  BMI:  Body mass index is 28.46 kg/m.  Estimated Nutritional Needs:   Kcal:  1645-1915 (MSJ x 1.2-1.4)  Protein:  93-110 grams (1.2-1.4 grams/kg)  Fluid:  >/= 1.9 L/day (25 ml/kg)  EDUCATION NEEDS:   Education needs addressed  Willey Blade, MS, RD, LDN Pager: [phone] After Hours Pager: [phone]

## 2016-10-09 ENCOUNTER — Inpatient Hospital Stay (HOSPITAL_COMMUNITY): Payer: BLUE CROSS/BLUE SHIELD

## 2016-10-09 DIAGNOSIS — J80 Acute respiratory distress syndrome: Secondary | ICD-10-CM

## 2016-10-09 DIAGNOSIS — J9620 Acute and chronic respiratory failure, unspecified whether with hypoxia or hypercapnia: Secondary | ICD-10-CM

## 2016-10-09 DIAGNOSIS — J9621 Acute and chronic respiratory failure with hypoxia: Secondary | ICD-10-CM

## 2016-10-09 HISTORY — DX: Acute and chronic respiratory failure, unspecified whether with hypoxia or hypercapnia: J96.20

## 2016-10-09 LAB — RESPIRATORY PANEL BY PCR
ADENOVIRUS-RVPPCR: NOT DETECTED
Bordetella pertussis: NOT DETECTED
CHLAMYDOPHILA PNEUMONIAE-RVPPCR: NOT DETECTED
CORONAVIRUS HKU1-RVPPCR: NOT DETECTED
CORONAVIRUS NL63-RVPPCR: NOT DETECTED
CORONAVIRUS OC43-RVPPCR: NOT DETECTED
Coronavirus 229E: NOT DETECTED
Influenza A: NOT DETECTED
Influenza B: NOT DETECTED
MYCOPLASMA PNEUMONIAE-RVPPCR: NOT DETECTED
Metapneumovirus: NOT DETECTED
PARAINFLUENZA VIRUS 1-RVPPCR: NOT DETECTED
PARAINFLUENZA VIRUS 4-RVPPCR: NOT DETECTED
Parainfluenza Virus 2: NOT DETECTED
Parainfluenza Virus 3: NOT DETECTED
Respiratory Syncytial Virus: NOT DETECTED
Rhinovirus / Enterovirus: NOT DETECTED

## 2016-10-09 LAB — ECHOCARDIOGRAM COMPLETE
HEIGHTINCHES: 65 in
WEIGHTICAEL: 2736 [oz_av]

## 2016-10-09 LAB — SEDIMENTATION RATE: SED RATE: 70 mm/h — AB (ref 0–22)

## 2016-10-09 NOTE — Progress Notes (Signed)
PROGRESS NOTE    [name]  [id number] DOB: Apr 12, 1961 DOA: 10/06/2016 PCP: Melina Schools, DO    Brief Narrative:  55 y.o. woman with a history of COPD, pulmonary sarcoidosis, HTN, breast cancer, and depression who presents to the ED for evaluation of progressive shortness of breath at rest and dyspnea on exertion.  Symptoms have been progressive over the past 1-2 weeks.  She has had intermittent cough with thick white sputum production (primarily in the mornings).  She denies fevers, chills, or sweats.  No light-headedness or syncope.  No swelling.  She has not taken anything over the counter or received any prescriptions as an outpatient for these symptoms.  She uses her Symbicort inhaler twice daily and she has an albuterol rescue inhaler at baseline  Assessment & Plan:   Principal Problem:   COPD exacerbation ([facility]) Active Problems:   Sarcoidosis ([place])   HYPERTENSION, BENIGN SYSTEMIC   Depression with anxiety   Difficulty in walking, not elsewhere classified   Acute on chronic respiratory failure ([facility])   COPD exacerbation with acute hypoxic respiratory failure complicated by known history of sarcoidosis with mildly progressive changes on CT scan of her chest. -Patient reports overall improvement since admission -Lungs remain clear without audible wheezing -patient remains stable, however continues to desaturate on minimal exertion -have consulted pulmonary for assistance -discussed case with pulmonary. Recommendations to follow-up on respiratory culture, 2-D echocardiogram, continue DuoNeb and steroids -Continue to wean as tolerated  Atypical chest discomfort in the setting of hypoxia -Remains chest pain-free -This admission, serial troponins were negative 3 -Presenting EKG unremarkable  -Likely chest wall pain secondary to COPD -Continue with analgesics as needed  HTN -Blood pressure remained stable -Patient continued on metoprolol and  hydrochlorothiazide  GERD -Remains stable currently -Continue pantoprazole  History of breast cancer --Continue Arimidex Currently stable  Depression --Continue Zoloft Appear stable at present  DVT prophylaxis: SCDs Code Status: Full code Family Communication: Patient in room, family not at bedside Disposition Plan: Uncertain at this time, anticipate when able to wean off O2 if possible  Consultants:   Pulmonary  Procedures:     Antimicrobials: Anti-infectives    None      Subjective: No complaints at present. Patient does report feeling short of breath on minimal exertion  Objective: Vitals:   10/09/16 1018 10/09/16 1020 10/09/16 1025 10/09/16 1500  BP:    (!) 146/78  Pulse:    70  Resp:    18  Temp:    98.2 F (36.8 C)  TempSrc:    Oral  SpO2: (!) 82% (!) 86% 94% 98%  Weight:      Height:        Intake/Output Summary (Last 24 hours) at 10/09/16 1621 Last data filed at 10/09/16 1409  Gross per 24 hour  Intake              720 ml  Output                0 ml  Net              720 ml   Filed Weights   10/06/16 1238  Weight: 77.6 kg (171 lb)    Examination:  General exam: Sitting in chair, conversant, no acute distress Respiratory system: Normal respiratory effort, clear, no audible wheezing Cardiovascular system: Regular rhythm, S1-S2 auscultated Gastrointestinal system: Soft, nondistended, no masses Central nervous system: No seizures, no tremors Extremities: No joint deformities, no cyanosis Skin: No rashes,  no pallor Psychiatry: Affect normal, no auditory hallucinations  Reviewed: I have personally reviewed following labs and imaging studies  CBC:  Recent Labs Lab 10/06/16 1249 10/07/16 0522  WBC 8.9 8.1  HGB 11.7* 11.0*  HCT 35.7* 34.3*  MCV 79.2 80.1  PLT 401* [id number]   Basic Metabolic Panel:  Recent Labs Lab 10/06/16 1249 10/07/16 0522  NA 139 140  K 3.8 4.3  CL 101 105  CO2 31 30  GLUCOSE 93 171*  BUN 7 12   CREATININE 0.67 0.72  CALCIUM 9.2 8.8*   GFR: Estimated Creatinine Clearance: 80.8 mL/min (by C-G formula based on SCr of 0.72 mg/dL). Liver Function Tests:  Recent Labs Lab 10/07/16 0522  AST 14*  ALT 10*  ALKPHOS 70  BILITOT 0.6  PROT 7.6  ALBUMIN 3.3*   No results for input(s): LIPASE, AMYLASE in the last 168 hours. No results for input(s): AMMONIA in the last 168 hours. Coagulation Profile: No results for input(s): INR, PROTIME in the last 168 hours. Cardiac Enzymes:  Recent Labs Lab 10/06/16 2317 10/07/16 0522 10/07/16 1015  TROPONINI <0.03 <0.03 <0.03   BNP (last 3 results) No results for input(s): PROBNP in the last 8760 hours. HbA1C: No results for input(s): HGBA1C in the last 72 hours. CBG: No results for input(s): GLUCAP in the last 168 hours. Lipid Profile: No results for input(s): CHOL, HDL, LDLCALC, TRIG, CHOLHDL, LDLDIRECT in the last 72 hours. Thyroid Function Tests: No results for input(s): TSH, T4TOTAL, FREET4, T3FREE, THYROIDAB in the last 72 hours. Anemia Panel: No results for input(s): VITAMINB12, FOLATE, FERRITIN, TIBC, IRON, RETICCTPCT in the last 72 hours. Sepsis Labs: No results for input(s): PROCALCITON, LATICACIDVEN in the last 168 hours.  Recent Results (from the past 240 hour(s))  Respiratory Panel by PCR     Status: None   Collection Time: 10/09/16  9:25 AM  Result Value Ref Range Status   Adenovirus NOT DETECTED NOT DETECTED Final   Coronavirus 229E NOT DETECTED NOT DETECTED Final   Coronavirus HKU1 NOT DETECTED NOT DETECTED Final   Coronavirus NL63 NOT DETECTED NOT DETECTED Final   Coronavirus OC43 NOT DETECTED NOT DETECTED Final   Metapneumovirus NOT DETECTED NOT DETECTED Final   Rhinovirus / Enterovirus NOT DETECTED NOT DETECTED Final   Influenza A NOT DETECTED NOT DETECTED Final   Influenza B NOT DETECTED NOT DETECTED Final   Parainfluenza Virus 1 NOT DETECTED NOT DETECTED Final   Parainfluenza Virus 2 NOT DETECTED NOT  DETECTED Final   Parainfluenza Virus 3 NOT DETECTED NOT DETECTED Final   Parainfluenza Virus 4 NOT DETECTED NOT DETECTED Final   Respiratory Syncytial Virus NOT DETECTED NOT DETECTED Final   Bordetella pertussis NOT DETECTED NOT DETECTED Final   Chlamydophila pneumoniae NOT DETECTED NOT DETECTED Final   Mycoplasma pneumoniae NOT DETECTED NOT DETECTED Final    Comment: Performed at [facility]     Radiology Studies: No results found.  Scheduled Meds: . anastrozole  1 mg Oral QHS  . aspirin EC  81 mg Oral Daily  . feeding supplement (ENSURE ENLIVE)  237 mL Oral BID BM  . gabapentin  100 mg Oral QHS  . hydrochlorothiazide  12.5 mg Oral Daily  . ipratropium-albuterol  3 mL Nebulization BID  . mouth rinse  15 mL Mouth Rinse q12n4p  . methylPREDNISolone (SOLU-MEDROL) injection  60 mg Intravenous Q6H  . metoprolol tartrate  25 mg Oral BID  . mometasone-formoterol  2 puff Inhalation BID  . pantoprazole  40  mg Oral Daily  . sertraline  50 mg Oral QHS  . sodium chloride flush  3 mL Intravenous Q12H   Continuous Infusions:   LOS: 2 days   Emigdio Wildeman, Orpah Melter, MD [facility] Pager [phone]  If 7PM-7AM, please contact night-coverage [email or web address] Password [email or web address] 10/09/2016, 4:21 PM

## 2016-10-09 NOTE — Progress Notes (Signed)
  Echocardiogram 2D Echocardiogram has been performed.  Jennette Dubin 10/09/2016, 4:23 PM

## 2016-10-09 NOTE — Progress Notes (Signed)
SATURATION QUALIFICATIONS: (This note is used to comply with regulatory documentation for home oxygen)  Patient Saturations on Room Air at Rest = 92%  Patient Saturations on Room Air while Ambulating = 82%  Patient Saturations on 2 Liters of oxygen while Ambulating = 94%  Please briefly explain why patient needs home oxygen:   

## 2016-10-09 NOTE — Consult Note (Signed)
PULMONARY / CRITICAL CARE MEDICINE   Name: [name] MRN: [id number] DOB: 24-Oct-1961    ADMISSION DATE:  10/06/2016 CONSULTATION DATE:  10/09/2016   REFERRING MD:  Dr Wyline Copas  CHIEF COMPLAINT:  New onset acute hypxoemic respiratory failure - baseline severe obstrucntion/sarcouid  HISTORY OF PRESENT ILLNESS:   [name] is 55 y.o. female of Not Hispanic or Latino bu african [place].  In 1992 per Hx of patient and review of Dr Melvyn Novas notes Tbbx diagnosed as sacoid. Quit smoking 2005.  On and off prednisone and weaned off in 2012 when had CT chest that showed bilateral UL > LL bronchocentric infiltrates c/w sarcoid.  Maintained on symbicort for many years. In 2014 had PFT that showed severe obstriuction ofev1 1L/45%, Ratio 41. No PFT afte rthat Doing ADLs and able to do treadmilll/exercise bike for 5-10 minute and go to work without dyspnea at baseline  In 2016: Breast cancer of upper-outer quadrant of right female breast [facility])   Staging form: Breast, AJCC 7th Edition   - Clinical stage from 08/04/2015: Stage IA (T1b, N0, M0) - Unsigned   - Pathologic stage from 08/18/2015: Stage IA (T1b, N0, cM0) - Unsigned  Last seen Dr Melvyn Novas pulmonary in  June 2017 and stable. Present in 10/06/2016 with 2 week history of progressive tiredness and shortness of breath. To me she denied any cough but review of the history and physical of present illness at admission showed she reported cough with thick white sputum. She denied fevers, chills or sweats or lightheadedness or syncope or edema. No fever. Reported ADLs started getting worse with dyspnea. When she arrived in the emergency department pulse ox was 77 room air. She improved with 3 L nasal cannula. Since admission treated with antibiotics and prednisone and feels "somewhat" improved but still "ways off" from her baseline. Therefore pulmonary critical care is been consulted. According to the bedside nurse patient is got normal oxygen status pulse ox on room  air at rest currently #27 2017 but does desaturate on exertion.  Admission CT angiogram chest is notable for increased infiltrates in the upper lobes compared to 2012. Stable micronodules since 2012. Stable lower lobe pulmonary infiltrates. Pattern overall consistent with pulmonary sarcoidosis. In addition there are findings of pulmonary arterial hypertension with his dilated pulmonary arterial trunk of 3.5 cm. This is stable since 2012. There is also coronary artery calcification but she did have a normal cardia stress test in 2014 with normal ejection fraction.  The other new finding in the CT chest currently is a right-sided pleural effusion. There is no further reports on Pulmicort function or echocardiogram since 2014.   PAST MEDICAL HISTORY :  She  has a past medical history of Allergic rhinitis; COPD, mild (HCC) (FOLLOWED BY DR Melvyn Novas); GERD (gastroesophageal reflux disease); HTN (hypertension); Hypertension; Iron deficiency anemia; Long-term current use of steroids (SYMBICORT INHALER); 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; Sarcoidosis ([place]) (STABLE PER CXR JUNE 2013); Shortness of breath; and Sickle cell trait ([place]).  PAST SURGICAL HISTORY: She  has a past surgical history that includes Cesarean section (1986); TOTAL ROBOTIC ASSISTED LAPAROSCOPIC HYSTERECTOMY (12-30-2010); UPPER TEETH EXTRACTION'S (1992); Median rectus repair (05/22/2012); Adjustable suture manipulation (05/22/2012); Eye surgery; and Radioactive seed guided mastectomy with axillary sentinel lymph node biopsy (Right, 08/18/2015).  Allergies  Allergen Reactions  . Codeine Other (See Comments)    Avoids-felt bad when took before    No current facility-administered medications on file prior to encounter.    Current Outpatient Prescriptions on File Prior  to Encounter  Medication Sig  . acetaminophen (TYLENOL) 500 MG tablet Take 1,000 mg by mouth every 6 (six) hours as needed for fever. Reported on 05/10/2016  . albuterol (PROVENTIL HFA;VENTOLIN  HFA) 108 (90 Base) MCG/ACT inhaler Inhale 2 puffs into the lungs every 6 (six) hours as needed for wheezing or shortness of breath.  . anastrozole (ARIMIDEX) 1 MG tablet Take 1 tablet (1 mg total) by mouth daily. (Patient taking differently: Take 1 mg by mouth at bedtime. )  . aspirin EC 81 MG EC tablet Take 1 tablet (81 mg total) by mouth daily.  . budesonide-formoterol (SYMBICORT) 160-4.5 MCG/ACT inhaler Take 2 puffs first thing in am and then another 2 puffs about 12 hours later.  . fluticasone (FLONASE) 50 MCG/ACT nasal spray Place 2 sprays into both nostrils daily as needed for allergies.  Marland Kitchen gabapentin (NEURONTIN) 100 MG capsule Take 1 capsule (100 mg total) by mouth at bedtime.  . hydrochlorothiazide (HYDRODIURIL) 12.5 MG tablet Take 1 tablet (12.5 mg total) by mouth daily.  . metoprolol tartrate (LOPRESSOR) 25 MG tablet Take 1 tablet (25 mg total) by mouth 2 (two) times daily.  . Multiple Vitamin (MULTIVITAMIN) capsule Take 1 capsule by mouth daily.  Marland Kitchen omeprazole (PRILOSEC) 20 MG capsule Take 1 capsule (20 mg total) by mouth daily.  . sertraline (ZOLOFT) 50 MG tablet Take 1 tablet (50 mg total) by mouth daily. (Patient taking differently: Take 50 mg by mouth at bedtime. )    FAMILY HISTORY:  Her indicated that her mother is alive. She indicated that her father is alive. She indicated that the status of her sister is unknown. She indicated that the status of her brother is unknown. She indicated that the status of her neg hx is unknown.    SOCIAL HISTORY: She  reports that she quit smoking about 12 years ago. She has a 20.00 pack-year smoking history. She has never used smokeless tobacco. She reports that she does not drink alcohol or use drugs.  REVIEW OF SYSTEMS:   According to history of present illness otherwise 11 point review of systems is negative.  VITAL SIGNS: BP (!) 160/84 (BP Location: Right Arm)   Pulse 66   Temp 98 F (36.7 C) (Oral)   Resp 16   Ht 5\' 5"  (1.651 m)    Wt 77.6 kg (171 lb)   SpO2 95%   BMI 28.46 kg/m   HEMODYNAMICS:    VENTILATOR SETTINGS:    INTAKE / OUTPUT: No intake/output data recorded.  PHYSICAL EXAMINATION: General:  Pleasant female lying in bed Neuro:  Alert and oriented 3. Speech normal. Moves all 4 extremity is normally HEENT:  Nasal cannula oxygen at 1 L. No neck nodes no elevated JVP Cardiovascular:  Normal heart sounds. Unclear if pulmonic component of second heart sound is elevated Lungs:  No respiratory distress. Bilateral basal crackles present no wheeze Abdomen:  Soft nontender no organomegaly Musculoskeletal:  No cyanosis no clubbing no edema Skin:  Intact on exposed areas  LABS: PULMONARY No results for input(s): PHART, PCO2ART, PO2ART, HCO3, TCO2, O2SAT in the last 168 hours.  Invalid input(s): PCO2, PO2  CBC  Recent Labs Lab 10/06/16 1249 10/07/16 0522  HGB 11.7* 11.0*  HCT 35.7* 34.3*  WBC 8.9 8.1  PLT 401* 350    COAGULATION No results for input(s): INR in the last 168 hours.  CARDIAC   Recent Labs Lab 10/06/16 2317 10/07/16 0522 10/07/16 1015  TROPONINI <0.03 <0.03 <0.03   No results  for input(s): PROBNP in the last 168 hours.   CHEMISTRY  Recent Labs Lab 10/06/16 1249 10/07/16 0522  NA 139 140  K 3.8 4.3  CL 101 105  CO2 31 30  GLUCOSE 93 171*  BUN 7 12  CREATININE 0.67 0.72  CALCIUM 9.2 8.8*   Estimated Creatinine Clearance: 80.8 mL/min (by C-G formula based on SCr of 0.72 mg/dL).   LIVER  Recent Labs Lab 10/07/16 0522  AST 14*  ALT 10*  ALKPHOS 70  BILITOT 0.6  PROT 7.6  ALBUMIN 3.3*     INFECTIOUS No results for input(s): LATICACIDVEN, PROCALCITON in the last 168 hours.   ENDOCRINE CBG (last 3)  No results for input(s): GLUCAP in the last 72 hours.       IMAGING x48h  - image(s) personally visualized  -   highlighted in bold No results found.      ASSESSMENT / PLAN:  PULMONARY A: - #Chronic problems - Chronic sarcoidosis  diagnosis based an African-American ethnicity and history of transbronchial biopsy and typical CT chest] maintain on some Symbicort. Based on pulmonary function tests in 2014 she has severe obstructive lung disease. Most likely she has stage 3/4. It appeared she did not need oxygen at this severe today. Last pulmonary function test was in 2014.  - Quit smoking 2005  - Low probability for alternate causes of interstitial lung disease  -There is evidence of pulmonary arterial hypertension on CT scan  #Acute problems - Acute hypoxemic respiratory failure with increase bilateral upper lobe infiltrates and a new onset left pleural effusion. Very likely a viral exacerbation with possibility of systolic/diastolic heart failure acute on chronic  - Slow improvement which could be just par for the course but need to rule out complicating associated factors  P:   Check respiratory virus panel Check echocardiogram to look at systolic and diastolic function and pulmonary arterial pressures  - At some point will need a right heart catheterization as outpatient because of potential therapeutic options in sarcoidosis related pulmonary arterial hypertension [she has baseline class II dyspnea] Check autoimmune panel for the sake of completeness even though this low probability for abnormalities Check ambulate her pulse ox on room air and document Continue DuoNeb and prednisone/Solu-Medrol at this point   [facility] M will follow  Dr. Brand Males, M.D., [facility].C.P Pulmonary and Critical Care Medicine Staff Physician [facility] Pager: [phone], If no answer or between  15:00h - 7:00h: call [phone]  10/09/2016 9:14 AM

## 2016-10-09 NOTE — Progress Notes (Signed)
Returned to pt room to complete adv dir w/notary and witnesses. Notary ([employer]) gave document to unit sec to copy and put in pt file. Pt was appreciative of visit and completion. Please page if additional assistance is needed. Chaplain Ernest Haber, M.Div.   10/07/16 1455  Clinical Encounter Type  Visited With Patient

## 2016-10-09 NOTE — Progress Notes (Signed)
Pt was lying in bed, awake and alert when [facility] arrived. She wanted to complete adv dir. She had a pkt bedside, and I went over it w/her. We discussed the differences btwn living will and healthcare power of atty. She wanted to complete both. Will return to complete w/witnesses and notary. Please page if assistance is needed prior to that time. Chaplain Ernest Haber, M.Div.   10/07/16 1425  Clinical Encounter Type  Visited With Patient     10/07/16 1425  Clinical Encounter Type  Visited With Patient

## 2016-10-10 ENCOUNTER — Inpatient Hospital Stay (HOSPITAL_COMMUNITY): Payer: BLUE CROSS/BLUE SHIELD

## 2016-10-10 ENCOUNTER — Other Ambulatory Visit (HOSPITAL_COMMUNITY): Payer: Self-pay | Admitting: Respiratory Therapy

## 2016-10-10 DIAGNOSIS — E440 Moderate protein-calorie malnutrition: Secondary | ICD-10-CM | POA: Insufficient documentation

## 2016-10-10 LAB — ANCA TITERS
C-ANCA: <1:20 {titer}
P-ANCA: <1:20 {titer}

## 2016-10-10 LAB — PULMONARY FUNCTION TEST
DL/VA % PRED: 51 %
DL/VA: 2.55 ml/min/mmHg/L
DLCO COR: 10.23 ml/min/mmHg
DLCO UNC % PRED: 36 %
DLCO UNC: 9.39 ml/min/mmHg
DLCO cor % pred: 39 %
FEF 25-75 PRE: 0.28 L/s
FEF2575-%PRED-PRE: 12 %
FEV1-%PRED-PRE: 38 %
FEV1-Pre: 0.87 L
FEV1FVC-%PRED-PRE: 51 %
FEV6-%PRED-PRE: 65 %
FEV6-PRE: 1.82 L
FEV6FVC-%PRED-PRE: 88 %
FVC-%Pred-Pre: 74 %
FVC-Pre: 2.12 L
PRE FEV6/FVC RATIO: 86 %
Pre FEV1/FVC ratio: 41 %

## 2016-10-10 LAB — ANTI-JO 1 ANTIBODY, IGG: Anti JO-1: <0.2 AI (ref 0.0–0.9)

## 2016-10-10 LAB — CYCLIC CITRUL PEPTIDE ANTIBODY, IGG/IGA: CCP Antibodies IgG/IgA: 7 units (ref 0–19)

## 2016-10-10 LAB — ANGIOTENSIN CONVERTING ENZYME: Angiotensin-Converting Enzyme: 45 U/L (ref 14–82)

## 2016-10-10 LAB — EXTRACTABLE NUCLEAR ANTIGEN ANTIBODY
ENA SM Ab Ser-aCnc: <0.2 AI (ref 0.0–0.9)
Ribonucleic Protein: 0.5 AI (ref 0.0–0.9)
SSA (Ro) (ENA) Antibody, IgG: <0.2 AI (ref 0.0–0.9)
SSB (La) (ENA) Antibody, IgG: <0.2 AI (ref 0.0–0.9)
Scleroderma (Scl-70) (ENA) Antibody, IgG: <0.2 AI (ref 0.0–0.9)
ds DNA Ab: <1 IU/mL (ref 0–9)

## 2016-10-10 LAB — MPO/PR-3 (ANCA) ANTIBODIES
ANCA Proteinase 3: <3.5 U/mL (ref 0.0–3.5)
Myeloperoxidase Abs: <9 U/mL (ref 0.0–9.0)

## 2016-10-10 LAB — RHEUMATOID FACTOR: Rhuematoid fact SerPl-aCnc: 11.4 IU/mL (ref 0.0–13.9)

## 2016-10-10 MED ORDER — FUROSEMIDE 10 MG/ML IJ SOLN
20.0000 mg | Freq: Once | INTRAMUSCULAR | Status: AC
  Administered 2016-10-10: 20 mg via INTRAVENOUS
  Filled 2016-10-10: qty 2

## 2016-10-10 MED ORDER — POTASSIUM CHLORIDE CRYS ER 20 MEQ PO TBCR
40.0000 meq | EXTENDED_RELEASE_TABLET | Freq: Once | ORAL | Status: AC
  Administered 2016-10-10: 40 meq via ORAL
  Filled 2016-10-10: qty 2

## 2016-10-10 MED ORDER — PREDNISONE 5 MG PO TABS: 5.0000 mg | ORAL_TABLET | Freq: Every day | ORAL | Status: DC

## 2016-10-10 NOTE — Progress Notes (Signed)
Physical Therapy Treatment Patient Details Name: TEEA EMBLER MRN: [id number] DOB: 1960/12/09 Today's Date: 10/10/2016    History of Present Illness QUADIRAH HOLDEN is a 55 y.o. woman with a history of COPD, pulmonary sarcoidosis, HTN, breast cancer, and depression who presents to the ED for evaluation of progressive shortness of breath at rest and dyspnea on exertion.    PT Comments    Pt able to ambulate 26' on room air with o2 sats in 90's before de-sating to 83% and o2 re-applied. Pt's o2 sat responds well to cues for breathing techniques. Will follow up for 1 more session if pt is still here in a few days.  Follow Up Recommendations  No PT follow up     Equipment Recommendations  None recommended by PT    Recommendations for Other Services       Precautions / Restrictions Precautions Precautions: Other (comment) Precaution Comments: monitor o2 sats    Mobility  Bed Mobility Overal bed mobility: Modified Independent                Transfers Overall transfer level: Modified independent                  Ambulation/Gait Ambulation/Gait assistance: Modified independent (Device/Increase time) Ambulation Distance (Feet): 250 Feet Assistive device: None (pushing dynamap) Gait Pattern/deviations: Step-through pattern     General Gait Details: Pt ambulated 85' on room air with o2 sats > 90% then they dropped down to 83%,  Donned o2 on 2 L/min and increased into 90's with cues for proper breathing techniques   Stairs Stairs: Yes Stairs assistance: Supervision Stair Management: One rail Left;Alternating pattern Number of Stairs: 3 General stair comments: Pt with no unsteadiness  Wheelchair Mobility    Modified Rankin (Stroke Patients Only)       Balance Overall balance assessment: No apparent balance deficits (not formally assessed)                                  Cognition Arousal/Alertness: Awake/alert Behavior During Therapy:  WFL for tasks assessed/performed Overall Cognitive Status: Within Functional Limits for tasks assessed                      Exercises      General Comments        Pertinent Vitals/Pain Pain Assessment: No/denies pain    Home Living                      Prior Function            PT Goals (current goals can now be found in the care plan section) Acute Rehab PT Goals Patient Stated Goal: return to prior level PT Goal Formulation: With patient Time For Goal Achievement: 10/14/16 Potential to Achieve Goals: Good Progress towards PT goals: Progressing toward goals    Frequency    Min 3X/week      PT Plan Current plan remains appropriate    Co-evaluation             End of Session Equipment Utilized During Treatment: Gait belt;Oxygen Activity Tolerance: Treatment limited secondary to medical complications (Comment) (de-sat) Patient left: in chair;with call bell/phone within reach     Time: 1307-1322 PT Time Calculation (min) (ACUTE ONLY): 15 min  Charges:  $Gait Training: 8-22 mins  G Codes:      Dajohn Ellender LUBECK 10/10/2016, 1:30 PM

## 2016-10-10 NOTE — Progress Notes (Signed)
SATURATION QUALIFICATIONS: (This note is used to comply with regulatory documentation for home oxygen)  Patient Saturations on Room Air at Rest = 93%  Patient Saturations on Room Air while Ambulating = 83%  Patient Saturations on 2 Liters of oxygen while Ambulating = 92%  Please briefly explain why patient needs home oxygen: De- sats on room air with functional gait distances.

## 2016-10-10 NOTE — Discharge Summary (Signed)
Physician Discharge Summary  [name] [id number] DOB: [birth year] DOA: 10/06/2016  PCP: Melina Schools, DO  Admit date: 10/06/2016 Discharge date: 10/10/2016  Admitted From: Home Disposition:  Home  Recommendations for Outpatient Follow-up:  1. Follow up with PCP in 1-2 weeks 2. Follow up with Pulmonary as scheduled  Equipment/Devices:Home O2    Discharge Condition:Stable CODE STATUS:Full Diet recommendation: Heart healthy   Brief/Interim Summary: 55 y.o.woman with a history of COPD, pulmonary sarcoidosis, HTN, breast cancer, and depression who presents to the ED for evaluation of progressive shortness of breath at rest and dyspnea on exertion. Symptoms have been progressive over the past 1-2 weeks. She has had intermittent cough with thick white sputum production (primarily in the mornings). She denies fevers, chills, or sweats. No light-headedness or syncope. No swelling. She has not taken anything over the counter or received any prescriptions as an outpatient for these symptoms. She uses her Symbicort inhaler twice daily and she has an albuterol rescue inhaler at baseline  COPD exacerbation with acute hypoxic respiratory failure complicated by known history of sarcoidosis with mildly progressive changes on CT scan of her chest. -Patient reports overall improvement since admission -Lungs remain clear without audible wheezing -patient remains stable, however continues to desaturate on minimal exertion -have consulted pulmonary for assistance -Patient found to have neg respiratory viral culture, unremarkable 2d echo -Pulmonary recommended trial of lasix x 1 dose with good urine output, however patient remains supplemental O2 dependent -Pulmonary recommends 2-3 week prednisone taper -See nursing documentation, Patient requires continuous home O2  Atypical chest discomfort in the setting of hypoxia -Remains chest pain-free -This admission, serial troponins were  negative 3 -Presenting EKG unremarkable  -Likely chest wall pain secondary to COPD  HTN -Blood pressure remained stable -Patient continued on metoprolol and hydrochlorothiazide  GERD -Remains stable currently -Continue pantoprazole  History of breast cancer --Continue Arimidex Currently stable  Depression --Continue Zoloft Appear stable at present  Discharge Diagnoses:  Principal Problem:   COPD exacerbation ([place]) Active Problems:   Sarcoidosis ([place])   HYPERTENSION, BENIGN SYSTEMIC   Depression with anxiety   Difficulty in walking, not elsewhere classified   Acute on chronic respiratory failure (HCC)   Malnutrition of moderate degree    Discharge Instructions     Medication List    TAKE these medications   acetaminophen 500 MG tablet Commonly known as:  TYLENOL Take 1,000 mg by mouth every 6 (six) hours as needed for fever. Reported on 05/10/2016   albuterol 108 (90 Base) MCG/ACT inhaler Commonly known as:  PROVENTIL HFA;VENTOLIN HFA Inhale 2 puffs into the lungs every 6 (six) hours as needed for wheezing or shortness of breath.   anastrozole 1 MG tablet Commonly known as:  ARIMIDEX Take 1 tablet (1 mg total) by mouth daily. What changed:  when to take this   aspirin 81 MG EC tablet Take 1 tablet (81 mg total) by mouth daily.   budesonide-formoterol 160-4.5 MCG/ACT inhaler Commonly known as:  SYMBICORT Take 2 puffs first thing in am and then another 2 puffs about 12 hours later.   CALCIUM PO Take 1 tablet by mouth daily.   fluticasone 50 MCG/ACT nasal spray Commonly known as:  FLONASE Place 2 sprays into both nostrils daily as needed for allergies.   gabapentin 100 MG capsule Commonly known as:  NEURONTIN Take 1 capsule (100 mg total) by mouth at bedtime.   hydrochlorothiazide 12.5 MG tablet Commonly known as:  HYDRODIURIL Take 1 tablet (12.5 mg total)  by mouth daily.   metoprolol tartrate 25 MG tablet Commonly known as:  LOPRESSOR Take  1 tablet (25 mg total) by mouth 2 (two) times daily.   multivitamin capsule Take 1 capsule by mouth daily.   omeprazole 20 MG capsule Commonly known as:  PRILOSEC Take 1 capsule (20 mg total) by mouth daily.   predniSONE 5 MG tablet Commonly known as:  DELTASONE Take 1 tablet (5 mg total) by mouth daily with breakfast.   sertraline 50 MG tablet Commonly known as:  ZOLOFT Take 1 tablet (50 mg total) by mouth daily. What changed:  when to take this            Durable Medical Equipment        Start     Ordered   10/10/16 1450  For home use only DME oxygen  Once    Question Answer Comment  Mode or (Route) Nasal cannula   Liters per Minute 2   Frequency Continuous (stationary and portable oxygen unit needed)   Oxygen delivery system Gas      10/10/16 1449      Allergies  Allergen Reactions  . Codeine Other (See Comments)    Avoids-felt bad when took before    Consultations:  Pulmonary  Procedures/Studies: Dg Chest 2 View  Result Date: 10/06/2016 CLINICAL DATA:  Shortness of breath today, COPD, history breast cancer post RIGHT lumpectomy, hypertension, sarcoidosis, COPD, GERD EXAM: CHEST  2 VIEW COMPARISON:  01/14/2016 FINDINGS: Normal heart size, mediastinal contours, and pulmonary vascularity. Chronic interstitial thickening throughout both lungs greater at the lower RIGHT chest, likely related to history of sarcoidosis. Tiny RIGHT pleural effusion, new. No definite acute infiltrate, LEFT pleural effusion, or pneumothorax. Surgical clips RIGHT breast and RIGHT axilla. No acute osseous findings. IMPRESSION: Chronic interstitial lung disease likely related to history of sarcoidosis, unchanged. Tiny new RIGHT pleural effusion. Electronically Signed   By: Lavonia Dana M.D.   On: 10/06/2016 13:11   Ct Angio Chest Pe W Or Wo Contrast  Result Date: 10/06/2016 CLINICAL DATA:  55 year old female with sarcoidosis presents with worsening shortness of breath and elevated  D-dimer. EXAM: CT ANGIOGRAPHY CHEST WITH CONTRAST TECHNIQUE: Multidetector CT imaging of the chest was performed using the standard protocol during bolus administration of intravenous contrast. Multiplanar CT image reconstructions and MIPs were obtained to evaluate the vascular anatomy. CONTRAST:  100 cc Isovue-300 IV. COMPARISON:  Chest radiograph from earlier today. 02/08/2011 chest CT angiogram. FINDINGS: Cardiovascular: The study is moderate quality for the evaluation of pulmonary embolism, with evaluation of the segmental and subsegmental pulmonary artery branches degraded by motion. There are no filling defects in the central, lobar, segmental or subsegmental pulmonary artery branches to suggest acute pulmonary embolism. Atherosclerotic nonaneurysmal thoracic aorta. Dilated main pulmonary artery (3.5 cm diameter), stable since 2012. Normal heart size. No significant pericardial fluid/thickening. Mediastinum/Nodes: No discrete thyroid nodules. Unremarkable esophagus. Surgical clips are noted in the right axilla. No axillary adenopathy. Mildly enlarged 1.0 cm AP window node (series 4/image 52), previously 0.9 cm, not definitely changed. No additional pathologically enlarged mediastinal or hilar nodes. Lungs/Pleura: No pneumothorax. Small right pleural effusion. Apical left upper lobe 6 mm pulmonary nodule (series 11/ image 19) is stable since 02/08/2011 and considered benign. Lingular 9 mm solid pulmonary nodule (series 11/ image 62), previously 8 mm, not significantly changed, considered benign. No acute consolidative airspace disease or new significant pulmonary nodules. There is extensive patchy ground-glass attenuation and interlobular septal thickening throughout both lungs, most prominent  in the upper lobes, increased since 02/08/2011. There is mild thickening of the central peribronchovascular interstitium and fine nodularity of the peripheral peribronchovascular interstitium and subpleural and  perifissural lungs bilaterally, not convincingly changed since 2012. There are patchy regions of parenchymal banding and perilobular lines with associated mild traction bronchiolectasis, distortion and volume loss in both lungs, not convincingly changed. Upper abdomen: Partially visualized mild gastrohepatic ligament lymphadenopathy measuring up to 1.5 cm (series 4/image 98), stable. Partially visualized left upper para-aortic adenopathy measuring up to 1.5 cm (series 4/ image 104), stable. Musculoskeletal: No aggressive appearing focal osseous lesions. Mild thoracic spondylosis. Review of the MIP images confirms the above findings. IMPRESSION: 1. Motion degraded scan.  No evidence of pulmonary embolism. 2. Spectrum of findings consistent with pulmonary sarcoidosis, including central peribronchovascular interstitial thickening, fine perilymphatic distribution nodularity, interlobular septal thickening, patchy ground-glass attenuation and scattered areas of fibrosis, mildly progressed since 02/08/2011, particularly the ground-glass attenuation and interlobular septal thickening. 3. Small right pleural effusion. 4. Mild mediastinal and upper retroperitoneal lymphadenopathy, stable, consistent with sarcoidosis. 5. Stable dilated main pulmonary artery, suggesting chronic pulmonary arterial hypertension. 6. Aortic atherosclerosis. Electronically Signed   By: Ilona Sorrel M.D.   On: 10/06/2016 17:56    Subjective: No complaints  Discharge Exam: Vitals:   10/10/16 0628 10/10/16 1009  BP: (!) 171/84 (!) 147/80  Pulse: 61 71  Resp: 10   Temp: 98.1 F (36.7 C)    Vitals:   10/09/16 2215 10/10/16 0628 10/10/16 0843 10/10/16 1009  BP: (!) 143/82 (!) 171/84  (!) 147/80  Pulse: 66 61  71  Resp: 17 10    Temp: 97.4 F (36.3 C) 98.1 F (36.7 C)    TempSrc: Oral Oral    SpO2: 96% 99% 96%   Weight:      Height:        General: Pt is alert, awake, not in acute distress Cardiovascular: RRR, S1/S2 +, no  rubs, no gallops Respiratory: CTA bilaterally, no wheezing, no rhonchi Abdominal: Soft, NT, ND, bowel sounds + Extremities: no edema, no cyanosis   The results of significant diagnostics from this hospitalization (including imaging, microbiology, ancillary and laboratory) are listed below for reference.     Microbiology: Recent Results (from the past 240 hour(s))  Respiratory Panel by PCR     Status: None   Collection Time: 10/09/16  9:25 AM  Result Value Ref Range Status   Adenovirus NOT DETECTED NOT DETECTED Final   Coronavirus 229E NOT DETECTED NOT DETECTED Final   Coronavirus HKU1 NOT DETECTED NOT DETECTED Final   Coronavirus NL63 NOT DETECTED NOT DETECTED Final   Coronavirus OC43 NOT DETECTED NOT DETECTED Final   Metapneumovirus NOT DETECTED NOT DETECTED Final   Rhinovirus / Enterovirus NOT DETECTED NOT DETECTED Final   Influenza A NOT DETECTED NOT DETECTED Final   Influenza B NOT DETECTED NOT DETECTED Final   Parainfluenza Virus 1 NOT DETECTED NOT DETECTED Final   Parainfluenza Virus 2 NOT DETECTED NOT DETECTED Final   Parainfluenza Virus 3 NOT DETECTED NOT DETECTED Final   Parainfluenza Virus 4 NOT DETECTED NOT DETECTED Final   Respiratory Syncytial Virus NOT DETECTED NOT DETECTED Final   Bordetella pertussis NOT DETECTED NOT DETECTED Final   Chlamydophila pneumoniae NOT DETECTED NOT DETECTED Final   Mycoplasma pneumoniae NOT DETECTED NOT DETECTED Final    Comment: Performed at [facility]: BNP (last 3 results)  Recent Labs  10/06/16 1447  BNP 52.1   Basic  Metabolic Panel:  Recent Labs Lab 10/06/16 1249 10/07/16 0522  NA 139 140  K 3.8 4.3  CL 101 105  CO2 31 30  GLUCOSE 93 171*  BUN 7 12  CREATININE 0.67 0.72  CALCIUM 9.2 8.8*   Liver Function Tests:  Recent Labs Lab 10/07/16 0522  AST 14*  ALT 10*  ALKPHOS 70  BILITOT 0.6  PROT 7.6  ALBUMIN 3.3*   No results for input(s): LIPASE, AMYLASE in the last 168 hours. No results  for input(s): AMMONIA in the last 168 hours. CBC:  Recent Labs Lab 10/06/16 1249 10/07/16 0522  WBC 8.9 8.1  HGB 11.7* 11.0*  HCT 35.7* 34.3*  MCV 79.2 80.1  PLT 401* 350   Cardiac Enzymes:  Recent Labs Lab 10/06/16 2317 10/07/16 0522 10/07/16 1015  TROPONINI <0.03 <0.03 <0.03   BNP: Invalid input(s): POCBNP CBG: No results for input(s): GLUCAP in the last 168 hours. D-Dimer No results for input(s): DDIMER in the last 72 hours. Hgb A1c No results for input(s): HGBA1C in the last 72 hours. Lipid Profile No results for input(s): CHOL, HDL, LDLCALC, TRIG, CHOLHDL, LDLDIRECT in the last 72 hours. Thyroid function studies No results for input(s): TSH, T4TOTAL, T3FREE, THYROIDAB in the last 72 hours.  Invalid input(s): FREET3 Anemia work up No results for input(s): VITAMINB12, FOLATE, FERRITIN, TIBC, IRON, RETICCTPCT in the last 72 hours. Urinalysis    Component Value Date/Time   COLORURINE YELLOW 10/06/2016 1423   APPEARANCEUR CLEAR 10/06/2016 1423   LABSPEC 1.011 10/06/2016 1423   PHURINE 6.0 10/06/2016 1423   GLUCOSEU NEGATIVE 10/06/2016 1423   HGBUR TRACE (A) 10/06/2016 1423   HGBUR small 05/13/2010 1505   BILIRUBINUR NEGATIVE 10/06/2016 1423   BILIRUBINUR NEG 12/29/2015 1444   KETONESUR NEGATIVE 10/06/2016 1423   PROTEINUR NEGATIVE 10/06/2016 1423   UROBILINOGEN 0.2 12/29/2015 1444   UROBILINOGEN 0.2 04/17/2015 1618   NITRITE NEGATIVE 10/06/2016 1423   LEUKOCYTESUR TRACE (A) 10/06/2016 1423   Sepsis Labs Invalid input(s): PROCALCITONIN,  WBC,  LACTICIDVEN Microbiology Recent Results (from the past 240 hour(s))  Respiratory Panel by PCR     Status: None   Collection Time: 10/09/16  9:25 AM  Result Value Ref Range Status   Adenovirus NOT DETECTED NOT DETECTED Final   Coronavirus 229E NOT DETECTED NOT DETECTED Final   Coronavirus HKU1 NOT DETECTED NOT DETECTED Final   Coronavirus NL63 NOT DETECTED NOT DETECTED Final   Coronavirus OC43 NOT DETECTED NOT  DETECTED Final   Metapneumovirus NOT DETECTED NOT DETECTED Final   Rhinovirus / Enterovirus NOT DETECTED NOT DETECTED Final   Influenza A NOT DETECTED NOT DETECTED Final   Influenza B NOT DETECTED NOT DETECTED Final   Parainfluenza Virus 1 NOT DETECTED NOT DETECTED Final   Parainfluenza Virus 2 NOT DETECTED NOT DETECTED Final   Parainfluenza Virus 3 NOT DETECTED NOT DETECTED Final   Parainfluenza Virus 4 NOT DETECTED NOT DETECTED Final   Respiratory Syncytial Virus NOT DETECTED NOT DETECTED Final   Bordetella pertussis NOT DETECTED NOT DETECTED Final   Chlamydophila pneumoniae NOT DETECTED NOT DETECTED Final   Mycoplasma pneumoniae NOT DETECTED NOT DETECTED Final    Comment: Performed at [facility]     SIGNED:   Donne Hazel, MD  [facility] 10/10/2016, 3:02 PM  If 7PM-7AM, please contact night-coverage [email or web address] Password [email or web address]

## 2016-10-10 NOTE — Care Management Note (Signed)
Case Management Note  Patient Details  Name: [name] MRN: [id number] Date of Birth: 1961/01/11  Subjective/Objective:  Qualified for home 02-[facility] dme rep Larene Beach aware to deliver to rm prior d/c.-awaiting home 02 order.                  Action/Plan:d/c home w/Home 02   Expected Discharge Date:   (unknown)               Expected Discharge Plan:  Home/Self Care  In-House Referral:     Discharge planning Services  CM Consult  Post Acute Care Choice:    Choice offered to:     DME Arranged:    DME Agency:     HH Arranged:    HH Agency:     Status of Service:     If discussed at [employer], dates discussed:    Additional Comments:  Dessa Phi, RN 10/10/2016, 1:54 PM

## 2016-10-10 NOTE — Consult Note (Addendum)
PULMONARY / CRITICAL CARE MEDICINE   Name: [name] MRN: [id number] DOB: 07-17-1961    ADMISSION DATE:  10/06/2016 CONSULTATION DATE:  10/09/2016   REFERRING MD:  Dr Wyline Copas  CHIEF COMPLAINT:  New onset acute hypxoemic respiratory failure - baseline severe obstrucntion/sarcouid  BRIEF [name] is 55 y.o. female of Not Hispanic or Latino bu african [place].  In 1992 per Hx of patient and review of Dr Melvyn Novas notes Tbbx diagnosed as sacoid. Quit smoking 2005.  On and off prednisone and weaned off in 2012 when had CT chest that showed bilateral UL > LL bronchocentric infiltrates c/w sarcoid.  Maintained on symbicort for many years. In 2014 had PFT that showed severe obstriuction ofev1 1L/45%, Ratio 41. No PFT afte rthat Doing ADLs and able to do treadmilll/exercise bike for 5-10 minute and go to work without dyspnea at baseline  In 2016: Breast cancer of upper-outer quadrant of right female breast [facility])   Staging form: Breast, AJCC 7th Edition   - Clinical stage from 08/04/2015: Stage IA (T1b, N0, M0) - Unsigned   - Pathologic stage from 08/18/2015: Stage IA (T1b, N0, cM0) - Unsigned  Last seen Dr Melvyn Novas pulmonary in  June 2017 and stable. Present in 10/06/2016 with 2 week history of progressive tiredness and shortness of breath. To me she denied any cough but review of the history and physical of present illness at admission showed she reported cough with thick white sputum. She denied fevers, chills or sweats or lightheadedness or syncope or edema. No fever. Reported ADLs started getting worse with dyspnea. When she arrived in the emergency department pulse ox was 77 room air. She improved with 3 L nasal cannula. Since admission treated with antibiotics and prednisone and feels "somewhat" improved but still "ways off" from her baseline. Therefore pulmonary critical care is been consulted. According to the bedside nurse patient is got normal oxygen status pulse ox on room air at rest currently  #27 2017 but does desaturate on exertion.  Admission CT angiogram chest is notable for increased infiltrates in the upper lobes compared to 2012. Stable micronodules since 2012. Stable lower lobe pulmonary infiltrates. Pattern overall consistent with pulmonary sarcoidosis. In addition there are findings of pulmonary arterial hypertension with his dilated pulmonary arterial trunk of 3.5 cm. This is stable since 2012. There is also coronary artery calcification but she did have a normal cardia stress test in 2014 with normal ejection fraction.  The other new finding in the CT chest currently is a right-sided pleural effusion. There is no further reports on Pulmicort function or echocardiogram since 2014.    SUBJECTIVE/OVERNIGHT/INTERVAL HX 10/10/2016 0 > RVP panel negative. ECHO normal - no report of diast dysfn, PFT pending. ESR 70. Autoimmune - RF negative rest pending. She feels some better. Did desat walking and needed 2L [place] to correct   VITAL SIGNS: BP (!) 171/84 (BP Location: Right Arm)   Pulse 61   Temp 98.1 F (36.7 C) (Oral)   Resp 10   Ht 5' 5"  (1.651 m)   Wt 171 lb (77.6 kg)   SpO2 96%   BMI 28.46 kg/m   HEMODYNAMICS:    VENTILATOR SETTINGS:    INTAKE / OUTPUT: I/O last 3 completed shifts: In: 84 [P.O.:960] Out: -   PHYSICAL EXAMINATION: General:  Pleasant female lying in bed Neuro:  Alert and oriented 3. Speech normal. Moves all 4 extremity is normally HEENT:  Nasal cannula oxygen at 1 L. No neck nodes no  elevated JVP Cardiovascular:  Normal heart sounds. Unclear if pulmonic component of second heart sound is elevated Lungs:  No respiratory distress. Bilateral basal crackles present and improved no wheeze Abdomen:  Soft nontender no organomegaly Musculoskeletal:  No cyanosis no clubbing no edema Skin:  Intact on exposed areas  LABS: PULMONARY No results for input(s): PHART, PCO2ART, PO2ART, HCO3, TCO2, O2SAT in the last 168 hours.  Invalid input(s): PCO2,  PO2  CBC  Recent Labs Lab 10/06/16 1249 10/07/16 0522  HGB 11.7* 11.0*  HCT 35.7* 34.3*  WBC 8.9 8.1  PLT 401* 350    COAGULATION No results for input(s): INR in the last 168 hours.  CARDIAC    Recent Labs Lab 10/06/16 2317 10/07/16 0522 10/07/16 1015  TROPONINI <0.03 <0.03 <0.03   No results for input(s): PROBNP in the last 168 hours.   CHEMISTRY  Recent Labs Lab 10/06/16 1249 10/07/16 0522  NA 139 140  K 3.8 4.3  CL 101 105  CO2 31 30  GLUCOSE 93 171*  BUN 7 12  CREATININE 0.67 0.72  CALCIUM 9.2 8.8*   Estimated Creatinine Clearance: 80.8 mL/min (by C-G formula based on SCr of 0.72 mg/dL).   LIVER  Recent Labs Lab 10/07/16 0522  AST 14*  ALT 10*  ALKPHOS 70  BILITOT 0.6  PROT 7.6  ALBUMIN 3.3*     INFECTIOUS No results for input(s): LATICACIDVEN, PROCALCITON in the last 168 hours.   ENDOCRINE CBG (last 3)  No results for input(s): GLUCAP in the last 72 hours.       IMAGING x48h  - image(s) personally visualized  -   highlighted in bold No results found.      ASSESSMENT / PLAN:  PULMONARY A: - #Chronic problems - Quit smoking 2005   - Chronic sarcoidosis diagnosis based an African-American ethnicity and history of transbronchial biopsy and typical CT chest] maintain on some Symbicort. Based on pulmonary function tests in 2014 she has severe obstructive lung disease. Most likely she has stage 3/4. It appeared she did not need oxygen in past Last pulmonary function test was in 2014.  - - Low probability for alternate causes of interstitial lung disease  -There is evidence of pulmonary arterial hypertension on CT scan but echo nov 2017 reports normal PA   #Acute problems - Acute hypoxemic respiratory failure with  -  increase bilateral upper lobe infiltrates and a new onset very small  left pleural effusion. Very likely a viral exacerbation but RVP negative.  - Echo with normal systolic/diastolic function  Per  report   - still with exertional desats 10/10/2016 slolwy improving with steroids   - Ddx is worsening sarcoid, mild ALI NOS, Resp virus (RVP false negative), acute dias chf esp in setting of effuson (echo false negative)   - Slow improvement which could be just par for the course but need to rule out complicating associated factors  P:   Give empiric lasix x 1 Await spirometry results Home with O2 Prednsione 2-3 week coruse taper Back to symbicort at [employer]  OPD fu  Future Appointments Date Time Provider [place]  10/18/2016 2:30 PM Magdalen Spatz, NP [facility] None  01/31/2017 3:30 PM Nicholas Lose, MD [facility] None  05/14/2017 4:15 PM Tanda Rockers, MD [facility] None    At [facility] with Ms Grocr 10/18/16 - recommend cxr and repeat walking desats. If worse, consideration for right heart cath  -> surgical lung bx. If better, can fu expectantly  Dr. Brand Males, M.D., [facility].C.P Pulmonary and Critical Care Medicine Staff Physician [place] [facility] Pager: [phone], If no answer or between  15:00h - 7:00h: call [phone]  10/10/2016 9:14 AM

## 2016-10-10 NOTE — Care Management Note (Signed)
Case Management Note  Patient Details  Name: [name] MRN: [id number] Date of Birth: May 24, 1961  Subjective/Objective:Noted 02 sats are placed-[facility] dme rep Larene Beach aware-awaiting home 02 order-Nurse paged attending.                    Action/Plan:d/c home/ home 02.   Expected Discharge Date:   (unknown)               Expected Discharge Plan:     In-House Referral:     Discharge planning Services     Post Acute Care Choice:    Choice offered to:     DME Arranged:    DME Agency:     HH Arranged:    HH Agency:     Status of Service:     If discussed at [employer], dates discussed:    Additional Comments:  Dessa Phi, RN 10/10/2016, 1:49 PM

## 2016-10-11 LAB — GLOMERULAR BASEMENT MEMBRANE ANTIBODIES: GBM Ab: 5 units (ref 0–20)

## 2016-10-12 ENCOUNTER — Telehealth: Payer: Self-pay | Admitting: Internal Medicine

## 2016-10-12 NOTE — Telephone Encounter (Signed)
Called and spoke with pt and she stated that she has turned her paper work into [employer] for her [place] and she stated that we need to send over to Nori Riis to let them know when her next appt is and to update them on the next move.  Pt has a pending appt with SG on 12/6.  MW please advise if this is ok to do.  thanks

## 2016-10-13 NOTE — Telephone Encounter (Signed)
Fax has been sent as pt requested to let them know when her next appt will be.  Nothing further is needed.

## 2016-10-13 NOTE — Telephone Encounter (Signed)
Fine with me

## 2016-10-17 ENCOUNTER — Telehealth: Payer: Self-pay | Admitting: Internal Medicine

## 2016-10-17 NOTE — Telephone Encounter (Signed)
Forms in MW's lookat

## 2016-10-18 ENCOUNTER — Encounter: Payer: Self-pay | Admitting: Acute Care

## 2016-10-18 ENCOUNTER — Ambulatory Visit (INDEPENDENT_AMBULATORY_CARE_PROVIDER_SITE_OTHER)
Admission: RE | Admit: 2016-10-18 | Discharge: 2016-10-18 | Disposition: A | Discharge status: Home / Self Care | Payer: BLUE CROSS/BLUE SHIELD | Source: Ambulatory Visit | Attending: Acute Care | Admitting: Acute Care

## 2016-10-18 ENCOUNTER — Ambulatory Visit (INDEPENDENT_AMBULATORY_CARE_PROVIDER_SITE_OTHER): Payer: BLUE CROSS/BLUE SHIELD | Admitting: Acute Care

## 2016-10-18 VITALS — BP 128/74 | HR 57 | Ht 65.0 in | Wt 178.4 lb

## 2016-10-18 DIAGNOSIS — J449 Chronic obstructive pulmonary disease, unspecified: Secondary | ICD-10-CM

## 2016-10-18 DIAGNOSIS — J441 Chronic obstructive pulmonary disease with (acute) exacerbation: Secondary | ICD-10-CM

## 2016-10-18 DIAGNOSIS — D869 Sarcoidosis, unspecified: Secondary | ICD-10-CM | POA: Diagnosis not present

## 2016-10-18 DIAGNOSIS — J189 Pneumonia, unspecified organism: Secondary | ICD-10-CM | POA: Diagnosis not present

## 2016-10-18 NOTE — Progress Notes (Addendum)
History of Present Illness [name] is a 55 y.o. female former smoker ( Quit 2005) with Gold III COPD and biopsy proven pulmonary sarcoidosis  Brief patient profile:  29 yobf quit smoking 10/2004 dx by transbronchial biopsy 5/92 with NCG inflammatory change consistent with sarcoid. Since that time she's been off and on prednisone multiple  times with symptoms of coughing dyspnea and skin involvement > weaned off chronic prednisone Feb 2012.   Overall she's been doing much better since she stopped smoking 10/2004 and much better since change to Symbicort 160/4.5 2 two times a day, able to do adl's ok without limitations including some steps. Documented GOLD III copd  07/2011   10/18/2016 Hospital Follow Up Visit: Pt. Presents for follow up appointment . She was admitted 10/06/2016 for progressive dyspnea at rest and exertion x 2 weeks PTA. Viral Culture was negative. Diagnosis for admission was COPD exacerbation, but may be multi-factorial as patient has biopsy confirmed pulmonary sarcoid also. She was treated with scheduled BD, IV steroids and improved with residual dyspnea on exertion/ desaturations.. She was seen by Dr. Chase Caller as an in patient consult. His diagnosis was worsening sarcoid, mild ALI, RVP negative, with acute diastolic heart failure ( false negative echo). He recommended Lasix x 1 dose, Spirometry, home oxygen, 2-3 week pred taper, and continue Symbicort BID. He stated if she did not improve on follow up she may need a right heart cath She was discharged home 10/10/2016 on  prednisone taper . PFT's were done on day of discharge, and show decline in pulmonary function: Severe Obstructive Disease. However, unsure if day of discharge was an appropriate time to get baseline PFT's.Will need to be repeated when patient is in a less acute situation.  She presents today stating she is doing better. She remains on oxygen at 2 L [place]. She is compliant with her Symbicort twice daily.She is  on her prednisone taper. She was advised to take 60 mg x 4 days, 40 mg x 4 days, 20 mg x 4 days, 10 mg x 4 days, then 5 mg x 4 days.She denies cough, increase in secretions, fever, chest pain, orthopnea or hemoptysis.She has not had to use her rescue inhaler or nasal spray since discharge.She states she has gained 7 pounds since starting on prednisone. She was hoping to be liberated form her oxygen. We did an ambulatory oximetry in the office today. Room Air Saturation at rest today was 92%. With ambulation of half lap of office , saturations dropped to 87%, and 84% with full lap. She was then placed on 2 L and rebounded to 90% within seconds. She will need to continue wearing her oxygen with exertion until follow up with Dr. Melvyn Novas in 4 weeks. CXR as below. She denies chest pain, fever, orthopnea, hemoptysis or leg swelling.  Tests CXR 10/18/2016  IMPRESSION: 1. Improved aeration since chest CT 10/06/2016. Right pleural effusion has resolved. 2. Scarring and hyperinflation in this patient with COPD and Sarcoidosis  10/09/2016: Echo Left ventricle: The cavity size was normal. Systolic function was   normal. The estimated ejection fraction was in the range of 60%   to 65%. Wall motion was normal; there were no regional wall   motion abnormalities. Left ventricular diastolic function   parameters were normal.  Past medical hx Past Medical History:  Diagnosis Date  . Allergic rhinitis   . COPD, mild ([place]) FOLLOWED BY DR Melvyn Novas  . GERD (gastroesophageal reflux disease)   . HTN (hypertension)   .  Hypertension   . Iron deficiency anemia   . Long-term current use of steroids SYMBICORT INHALER  . 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 Sarcoidosis ([place]) STABLE PER CXR JUNE 2013  . Shortness of breath   . Sickle cell trait (HCC)      Past surgical hx, Family hx, Social hx all reviewed.  Current Outpatient Prescriptions on File Prior to Visit  Medication Sig  . acetaminophen (TYLENOL) 500 MG tablet Take 1,000 mg  by mouth every 6 (six) hours as needed for fever. Reported on 05/10/2016  . albuterol (PROVENTIL HFA;VENTOLIN HFA) 108 (90 Base) MCG/ACT inhaler Inhale 2 puffs into the lungs every 6 (six) hours as needed for wheezing or shortness of breath.  . anastrozole (ARIMIDEX) 1 MG tablet Take 1 tablet (1 mg total) by mouth daily. (Patient taking differently: Take 1 mg by mouth at bedtime. )  . aspirin EC 81 MG EC tablet Take 1 tablet (81 mg total) by mouth daily.  . budesonide-formoterol (SYMBICORT) 160-4.5 MCG/ACT inhaler Take 2 puffs first thing in am and then another 2 puffs about 12 hours later.  Marland Kitchen CALCIUM PO Take 1 tablet by mouth daily.  . fluticasone (FLONASE) 50 MCG/ACT nasal spray Place 2 sprays into both nostrils daily as needed for allergies.  Marland Kitchen gabapentin (NEURONTIN) 100 MG capsule Take 1 capsule (100 mg total) by mouth at bedtime.  . hydrochlorothiazide (HYDRODIURIL) 12.5 MG tablet Take 1 tablet (12.5 mg total) by mouth daily.  . metoprolol tartrate (LOPRESSOR) 25 MG tablet Take 1 tablet (25 mg total) by mouth 2 (two) times daily.  . Multiple Vitamin (MULTIVITAMIN) capsule Take 1 capsule by mouth daily.  Marland Kitchen omeprazole (PRILOSEC) 20 MG capsule Take 1 capsule (20 mg total) by mouth daily.  . predniSONE (DELTASONE) 5 MG tablet Take 1 tablet (5 mg total) by mouth daily with breakfast.  . sertraline (ZOLOFT) 50 MG tablet Take 1 tablet (50 mg total) by mouth daily. (Patient taking differently: Take 50 mg by mouth at bedtime. )   No current facility-administered medications on file prior to visit.      Allergies  Allergen Reactions  . Codeine Other (See Comments)    Avoids-felt bad when took before    Review Of Systems:  Constitutional:   No  weight loss, night sweats,  Fevers, chills, + fatigue, or  lassitude.  HEENT:   No headaches,  Difficulty swallowing,  Tooth/dental problems, or  Sore throat,                No sneezing, itching, ear ache, nasal congestion, post nasal drip,   CV:  No  chest pain,  Orthopnea, PND, swelling in lower extremities, anasarca, dizziness, palpitations, syncope.   GI  No heartburn, indigestion, abdominal pain, nausea, vomiting, diarrhea, change in bowel habits, loss of appetite, bloody stools.   Resp: + shortness of breath with exertion or at rest.  No excess mucus, + productive cough,  No non-productive cough,  No coughing up of blood.  No change in color of mucus.  + wheezing.  No chest wall deformity  Skin: no rash or lesions.  GU: no dysuria, change in color of urine, no urgency or frequency.  No flank pain, no hematuria   MS:  No joint pain or swelling.  No decreased range of motion.  No back pain.  Psych:  No change in mood or affect. No depression or anxiety.  No memory loss.   Vital Signs BP 128/74 (BP Location: Left Arm, Cuff  Size: Normal)   Pulse (!) 57   Ht 5\' 5"  (1.651 m)   Wt 178 lb 6.4 oz (80.9 kg)   SpO2 97%   BMI 29.69 kg/m    Physical Exam:  General- No distress,  A&Ox3, pleasant ENT: No sinus tenderness, TM clear, pale nasal mucosa, no oral exudate,no post nasal drip, no LAN Cardiac: S1, S2, regular rate and rhythm, no murmur Chest: + wheeze/ no rales/ dullness; no accessory muscle use, no nasal flaring, no sternal retractions Abd.: Soft Non-tender Ext: No clubbing cyanosis, edema Neuro:  normal strength Skin: No rashes, warm and dry Psych: normal mood and behavior   Assessment/Plan  COPD exacerbation (HCC) Resolving mixed COPD/ sarcoid exacerbation Plan: Try some saline mist up both nares for nasal dryness. Please try the saline gel at night for nasal dryness. Continue your prednisone taper as prescribed. This will continue for 16 days. Continue your Symbicort twice daily.  Remember to use your rescue inhaler as needed. Remember to rinse your mouth after use. You need to continue wearing your oxygen with exertion. While at rest you can take the oxygen off. Oxygen saturation goals are 90-92%. Follow up  appointment with Dr. Melvyn Novas 3 weeks, after prednisone taper is complete.. Please contact office for sooner follow up if symptoms do not improve or worsen or seek emergency care    Sarcoidosis ([place]) Resolving Flare/ progressing disease status Continued hypoxia with exertion Plan: Oxygen at 2L with exertion Maintain saturation 88-93% Will need follow up PFT's once flare has resolved to stage worsening of sarcoid. Follow up with Dr. Melvyn Novas in 4 weeks  Consider Right Heart Cath if continued worsening dyspnea/ increasing oxygen needs to re-evaluate PAP.  Magdalen Spatz, NP 10/18/2016  5:57 PM

## 2016-10-18 NOTE — Patient Instructions (Addendum)
It is good to see you today. I am glad you are feeling better. Try some saline mist up both nares for nasal dryness. Please try the saline gel at night for nasal dryness. Continue your prednisone taper as prescribed. This will continue for 16 days. Continue your Symbicort twice daily.  Remember to use your rescue inhaler as needed. Remember to rinse your mouth after use. You need to continue wearing your oxygen with exertion. While at rest you can take the oxygen off. Oxygen saturation goals are 90-92%. Follow up appointment with Dr. Melvyn Novas 3 weeks, after prednisone taper is complete.. Please contact office for sooner follow up if symptoms do not improve or worsen or seek emergency care

## 2016-10-18 NOTE — Telephone Encounter (Signed)
Sarah and MW have discussed the [employer] forms  I believe that SG filled out most of the forms and they were returned to MW to review and sign

## 2016-10-18 NOTE — Assessment & Plan Note (Signed)
Resolving mixed COPD/ sarcoid exacerbation Plan: Try some saline mist up both nares for nasal dryness. Please try the saline gel at night for nasal dryness. Continue your prednisone taper as prescribed. This will continue for 16 days. Continue your Symbicort twice daily.  Remember to use your rescue inhaler as needed. Remember to rinse your mouth after use. You need to continue wearing your oxygen with exertion. While at rest you can take the oxygen off. Oxygen saturation goals are 90-92%. Follow up appointment with Dr. Melvyn Novas 3 weeks, after prednisone taper is complete.. Please contact office for sooner follow up if symptoms do not improve or worsen or seek emergency care

## 2016-10-18 NOTE — Assessment & Plan Note (Signed)
Resolving Flare/ progressing disease status Continued hypoxia with exertion Plan: Oxygen at 2L with exertion Maintain saturation 88-93% Will need follow up PFT's once flare has resolved to stage worsening of sarcoid. Follow up with Dr. Melvyn Novas in 4 weeks

## 2016-10-18 NOTE — Telephone Encounter (Signed)
Per MW- pt needs appt for these FMLA forms to be completed  I called pt to schedule appt  She has ov with SG today

## 2016-10-19 ENCOUNTER — Telehealth: Payer: Self-pay | Admitting: Internal Medicine

## 2016-10-19 NOTE — Telephone Encounter (Signed)
Rec'd FMLA and disability forms back from [place] completed disability forms and Eric Form completed FMLA  - fwd to [employer] via interoffice maile - 10/19/16 -pr

## 2016-10-19 NOTE — Progress Notes (Signed)
Chart and office note reviewed in detail  > agree with a/p as outlined    

## 2016-10-19 NOTE — Telephone Encounter (Signed)
done

## 2016-10-26 ENCOUNTER — Telehealth: Payer: Self-pay | Admitting: Internal Medicine

## 2016-10-26 ENCOUNTER — Other Ambulatory Visit: Payer: Self-pay | Admitting: Internal Medicine

## 2016-10-26 NOTE — Telephone Encounter (Signed)
That's fine but lets get her in asap/ ok to add to my schedule

## 2016-10-26 NOTE — Telephone Encounter (Signed)
Spoke with pt. States that she is having issues with her breathing. Reports increased SOB since decreasing her prednisone. She is currently taking 10mg  of prednisone. SOB is worse with activity. Denies chest tightness, wheezing, coughing or fever. Pt is currently wearing 2L of oxygen. Today her oxygen level got down to 80% on 2L with exertion. She is wanting to know if she can increase her prednisone back to 20mg  daily.  MW - please advise. Thanks.

## 2016-10-26 NOTE — Telephone Encounter (Signed)
Spoke with pt. She has been added to MW's schedule tomorrow at 10:30am. Advised her that we are working her in so she may have a little of a wait. She verbalized understanding. Nothing further was needed.

## 2016-10-26 NOTE — Telephone Encounter (Signed)
Pt called and needs a refill on her hydrochlorothiazide sent in. jw

## 2016-10-27 ENCOUNTER — Ambulatory Visit (INDEPENDENT_AMBULATORY_CARE_PROVIDER_SITE_OTHER): Payer: BLUE CROSS/BLUE SHIELD | Admitting: Internal Medicine

## 2016-10-27 ENCOUNTER — Encounter: Payer: Self-pay | Admitting: Internal Medicine

## 2016-10-27 VITALS — BP 140/90 | HR 66 | Ht 65.0 in | Wt 175.0 lb

## 2016-10-27 DIAGNOSIS — J9611 Chronic respiratory failure with hypoxia: Secondary | ICD-10-CM | POA: Diagnosis not present

## 2016-10-27 DIAGNOSIS — J449 Chronic obstructive pulmonary disease, unspecified: Secondary | ICD-10-CM | POA: Diagnosis not present

## 2016-10-27 DIAGNOSIS — D869 Sarcoidosis, unspecified: Secondary | ICD-10-CM | POA: Diagnosis not present

## 2016-10-27 MED ORDER — HYDROCHLOROTHIAZIDE 12.5 MG PO TABS: 12.5000 mg | ORAL_TABLET | Freq: Every day | ORAL | 3 refills | Status: DC

## 2016-10-27 MED ORDER — BUDESONIDE-FORMOTEROL FUMARATE 160-4.5 MCG/ACT IN AERO: INHALATION_SPRAY | RESPIRATORY_TRACT | 0 refills | Status: DC

## 2016-10-27 MED ORDER — PREDNISONE 10 MG PO TABS: ORAL_TABLET | ORAL | 2 refills | Status: DC

## 2016-10-27 NOTE — Progress Notes (Signed)
Subjective:    Patient ID: ESM… FLAMMER, female   DOB: [birth year]    MRN: [id number]    Brief patient profile:  88 yobf quit smoking 10/2004 dx by transbronchial biopsy 5/92 with NCG inflammatory change consistent with sarcoid. Since that time she's been off and on prednisone multiple  times with flares of coughing dyspnea and skin involvement > weaned off chronic prednisone Feb 2012.   Overall she's been doing much better since she stopped smoking 10/2004 and much better since change to Symbicort 160/4.5 2 two times a day, able to do adl's ok without limitations including some steps. Documented GOLD III copd  07/2011   Placed back on daily steroids for new resp failure 09/2016     History of Present Illness 04/23/2014 f/u ov/Wert re: cough since late May 2015  Chief Complaint  Patient presents with  . Follow-up    Pt reports cough only minimally improved since eval here on 04/13/14. Sometimes coughs until develops HA and vomiting.    Worse at hs and in am ,states already taking omeprazole 20 mg bid ac (though very hesitant with answers re meds and does not have med calendar with action plan as rec previously  rx doxy/ pred and "no better" but no purulent sputum at all now/ actually very little mucus production unless vomits from coughng Never took hycodan Not limited by breathing from desired activities   rec Go ahead and take your cough medication to completely suppress your cough Prednisone 10 mg take  4 each am x 2 days,   2 each am x 2 days,  1 each am x 2 days and stop  Add pepcid ac 20 mg at bedtime and chlortrimeton 4 mg (over the counter) x 2 at bedtime until cough is 100% gone x 2 weeks GERD  Please see patient coordinator before you leave today  to schedule sinus CT> not done   I f not better return in 4 weeks with all meds and med calendar   08/21/2014 f/u ov/Wert re:  GOLD III copd / no med calendar  Chief Complaint  Patient presents with  . Follow-up    Pt states having  increased DOE just since this am. She was SOB walking from her car to our building today.     did not use saba on day of ov/ saba was clogged up  > rec Pulm rehab> never rehab      05/11/2016  f/u ov/Wert re: GOLD III / symb 160 2bid  Chief Complaint  Patient presents with  . Follow-up    Breathing is overall doing well and she rarely uses rescue inhaler. Needs refills on Symbicort.   working out at the gym but not much aerobics   No tendency to aecopd  rec No change rx/ no need for prednisone   2 weeks prior to Tgiving 2017  progressive doe indolent onset some am cough    Admit date: 10/06/2016 Discharge date: 10/10/2016  Admitted From: Home Disposition:  Home  Recommendations for Outpatient Follow-up:  1. Follow up with PCP in 1-2 weeks 2. Follow up with Pulmonary as scheduled  Equipment/Devices:Home O2    Discharge Condition:Stable CODE STATUS:Full Diet recommendation: Heart healthy   Brief/Interim Summary: 55 y.o.woman with a history of COPD, pulmonary sarcoidosis, HTN, breast cancer, and depression who presents to the ED for evaluation of progressive shortness of breath at rest and dyspnea on exertion. Symptoms have been progressive over the past 1-2 weeks. She has had  intermittent cough with thick white sputum production (primarily in the mornings). She denies fevers, chills, or sweats. No light-headedness or syncope. No swelling. She has not taken anything over the counter or received any prescriptions as an outpatient for these symptoms. She uses her Symbicort inhaler twice daily and she has an albuterol rescue inhaler at baseline  COPD exacerbation with acute hypoxic respiratory failure complicated by known history of sarcoidosis with mildly progressive changes on CT scan of her chest. -Patient reports overall improvement since admission -Lungs remain clear without audible wheezing -patient remains stable, however continues to desaturate on minimal  exertion -have consulted pulmonary for assistance -Patient found to have neg respiratory viral culture, unremarkable 2d echo -Pulmonary recommended trial of lasix x 1 dose with good urine output, however patient remains supplemental O2 dependent -Pulmonary recommends 2-3 week prednisone taper -See nursing documentation, Patient requires continuous home O2  Atypical chest discomfort in the setting of hypoxia -Remains chest pain-free -This admission, serial troponins were negative 3 -Presenting EKG unremarkable  -Likely chest wall pain secondary to COPD  HTN -Blood pressure remained stable -Patient continued on metoprolol and hydrochlorothiazide  GERD -Remains stable currently -Continue pantoprazole  History of breast cancer --Continue Arimidex Currently stable  Depression --Continue Zoloft Appear stable at present  Discharge Diagnoses:    COPD exacerbation ([place])    Sarcoidosis ([place])   HYPERTENSION, BENIGN SYSTEMIC   Depression with anxiety   Difficulty in walking, not elsewhere classified   Acute on chronic respiratory failure ([place])   Malnutrition of moderate degree    10/27/2016 extended post hosp f/u ov/Wert/ transition of care  re: Sarcoid/  GOLD III copd/ symb 160 2bid  Chief Complaint  Patient presents with  . Follow-up    little troble breathing when decreased prednisone down to 10mg , hard to maneuver around the house even on oxygen at 2L  feels better on 02 and d/c on pred 60 > 20 and fine until reduced to 10 x 4 days then worse sob  Prior to reducing the 02 able to walk around home s 02 and now desat 70s on 02  Different from sarcoid because with sarcoid had assoc cough / similar to sarcoid in terms of appetite, worse off it/ better on it   No obvious day to day or daytime variabilty or assoc excess/ purulent sputum or mucus plugs  or cp or chest tightness, subjective wheeze overt sinus or hb symptoms. No unusual exp hx or h/o childhood pna/ asthma or  knowledge of premature birth.  Sleeping ok without nocturnal  or early am exacerbation  of respiratory  c/o's or need for noct saba. Also denies any obvious fluctuation of symptoms with weather or environmental changes or other aggravating or alleviating factors except as outlined above   Current Medications, Allergies, Complete Past Medical History, Past Surgical History, Family History, and Social History were reviewed in [employer] record.  ROS  The following are not active complaints unless bolded sore throat, dysphagia, dental problems, itching, sneezing,  nasal congestion or excess/ purulent secretions, ear ache,   fever, chills, sweats, unintended wt loss, pleuritic or exertional cp, hemoptysis,  orthopnea pnd or leg swelling, presyncope, palpitations, abdominal pain, anorexia, nausea, vomiting, diarrhea  or change in bowel or urinary habits, change in stools or urine, dysuria,hematuria,  rash, arthralgias, visual complaints, headache, numbness weakness or ataxia or problems with walking or coordination,  change in mood/affect or memory.  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 Unice Cobble  -h/o Daily prednisone since 2007   > off completely Feb 2012 with no problem ? 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 (ICD-280.9)  Health Maintenance.......................................................   [facility]   Family History:  crohn`s in daughter, M-lupus, htn, asthma,  no Ca, DM, CAD    Social History:   lives with twin children and grandson. single; >20 pack year history, quit in 2005; no EtOH  Laid off  from works for [employer] Sept 2013           Objective:   Physical Exam wt 166 Oct 12, 2009 >170 October 28, 2010 > 169 06/29/2011 >>171 08/24/2011 > 01/15/2012  171 > 04/24/2012 176 > 06/28/2012  175> 08/13/2012 > 08/30/2012  173 > 170 11/28/2012 > 01/29/2013  176 > 174 >173 06/16/2013 > 171 09/18/2013 > 12/19/2013 177 >179 04/13/2014>  04/23/2014 177 > 08/21/2014 184 >182 11/27/2014 > 05/12/2015 177  > 05/11/2016   177 > 10/27/2016  175   amb bf nad / vital signs reviewed   - Note on arrival 02 sats  97% on 2lpm     HEENT: nl dentition, turbinates, and oropharynx. Nl external ear canals without cough reflex   NECK :  without JVD/Nodes/TM/ nl carotid upstrokes bilaterally   LUNGS: no acc muscle use,  A few distant crackles in bases bilaterally/ min exp rhonchi s cough on insp/exp    CV:  RRR  no s3 or murmur or increase in P2, no edema   ABD:  soft and nontender with nl excursion in the supine position. No bruits or organomegaly, bowel sounds nl  MS:  warm without deformities, calf tenderness, cyanosis or clubbing  SKIN: warm and dry without lesions    NEURO:  alert, approp, no deficits       .  I personally reviewed images and agree with radiology impression as follows:  CXR:   10/18/16 1. Improved aeration since chest CT 10/06/2016. Right pleural effusion has resolved. 2. Scarring and hyperinflation in this patient with COPD and sarcoidosis.      Lab Results  Component Value Date   ESRSEDRATE 70 (H) 10/09/2016     Lab Results  Component Value Date   WBC 8.1 10/07/2016   HGB 11.0 (L) 10/07/2016   HCT 34.3 (L) 10/07/2016   MCV 80.1 10/07/2016    PLT 350 10/07/2016    . Assessment:

## 2016-10-27 NOTE — Telephone Encounter (Signed)
Refill for HCTZ sent in.   Phill Myron, D.O. 10/27/2016, 11:33 AM PGY-2, [place]

## 2016-10-27 NOTE — Telephone Encounter (Signed)
Pt informed. Judd Mccubbin T Yichen Gilardi, CMA  

## 2016-10-27 NOTE — Patient Instructions (Addendum)
Resume prednisone 20 mg daily until next office visit  Late add: If she is going to continue to need systemic steroids, a good option for her might be lama/laba s the the ics eg BEVESPI, will  Re-consider at next ov

## 2016-10-29 ENCOUNTER — Encounter: Payer: Self-pay | Admitting: Internal Medicine

## 2016-10-29 DIAGNOSIS — J9611 Chronic respiratory failure with hypoxia: Secondary | ICD-10-CM | POA: Insufficient documentation

## 2016-10-29 DIAGNOSIS — J9612 Chronic respiratory failure with hypercapnia: Secondary | ICD-10-CM

## 2016-10-29 NOTE — Assessment & Plan Note (Signed)
Quit smoking 2005  - PFT's 06/26/08 FEV1 1.09 ratio  - PFTs 03/02/10 FEV1 1.06 ratio 34    PFT's 08/01/2011 FEV1 1.6 (42%)  41 ratio  DLCO 55 corrects 77 - Spiriva trial March 02, 2010 > ? better but not worse off it 04/2011 - 05/12/2015 p extensive coaching HFA effectiveness =    90%  - Referred to Rehab 08/21/2014 > could not arrange due to schedule  -med calendar 06/16/2013 > did not bring to office as requested 12/19/13 or 04/23/14  Or 08/21/2014  - 05/11/2016  After extensive coaching HFA effectiveness =    90%  If she is going to continue to need systemic steroids, a good option for her might be lama/laba s the the ics eg BEVESPI, will  Re-consider at next ov

## 2016-10-29 NOTE — Assessment & Plan Note (Addendum)
Positive transbronchial biopsy 02/19/91 by Dr. Unice Cobble  -Daily prednisone since 2007 with ? adrenal insufficiency iatrogenic > off completely Feb 2012 with no recurrence - PFT's 06/26/08 FEV1  1.09 ratio  - PFTs  03/02/10 FEV1  1.06 (42%) ratio 34 and no better p B2,  DLC0 48% corrects to 64% - PFT's 08/01/2011 FEV1 1.6 (42%)  41 ratio  DLCO 55 corrects 77 - PFTs 10/10/16    FEV1  0.87(38)ratio 41 and DLCO 36/39c corrects to 51 for alv vol > placed back on daily prednisone - ESR 70  10/09/16    Although she has no cough this time, there is clearly a steroid responsive form on ILD here which could just be related to ALI from pna or other form of acute lung insult overlapping with sarcoid ILD (and 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  For now therefore I recommend the patient maintain  A ceiling of 20 mg daily and a floor of 10 mg and follow the esr serially  I had an extended discussion with the patient reviewing all relevant studies completed to date and  lasting 25 minutes of a 40  minute transition of care office  visit  re  non-specific but potentially very serious pulmonary symptoms of unknown etiology.  Each maintenance medication was reviewed in detail including most importantly the difference between maintenance and prns and under what circumstances the prns are to be triggered using an action plan format that is not reflected in the computer generated alphabetically organized AVS.    Please see AVS for unique  instructions that I personally wrote and verbalized to the the pt in detail and then reviewed with pt  by my nurse highlighting any  changes in therapy recommended at today's visit to their plan of care.

## 2016-10-29 NOTE — Assessment & Plan Note (Signed)
New start 09/2016 at admit see records - 10/18/16 Patient Saturations on Room Air at Rest = 92% Patient Saturations on Room Air while Ambulating = 84% Patient Saturations on 2 Liters of oxygen while Ambulating = 90%  Adequate control on present rx, reviewed in detail with pt > no change in rx needed  = 2lpm 24/7

## 2016-11-01 ENCOUNTER — Telehealth: Payer: Self-pay | Admitting: Internal Medicine

## 2016-11-01 ENCOUNTER — Inpatient Hospital Stay: Payer: BLUE CROSS/BLUE SHIELD | Admitting: Internal Medicine

## 2016-11-01 NOTE — Telephone Encounter (Signed)
Rec'd short term disability forms from [place] via fax. Fwd to [employer] via interoffice mail. - pr

## 2016-11-09 ENCOUNTER — Ambulatory Visit (INDEPENDENT_AMBULATORY_CARE_PROVIDER_SITE_OTHER): Payer: BLUE CROSS/BLUE SHIELD | Admitting: Internal Medicine

## 2016-11-09 ENCOUNTER — Other Ambulatory Visit (INDEPENDENT_AMBULATORY_CARE_PROVIDER_SITE_OTHER): Payer: BLUE CROSS/BLUE SHIELD

## 2016-11-09 ENCOUNTER — Encounter: Payer: Self-pay | Admitting: Internal Medicine

## 2016-11-09 ENCOUNTER — Ambulatory Visit (INDEPENDENT_AMBULATORY_CARE_PROVIDER_SITE_OTHER)
Admission: RE | Admit: 2016-11-09 | Discharge: 2016-11-09 | Disposition: A | Discharge status: Home / Self Care | Payer: BLUE CROSS/BLUE SHIELD | Source: Ambulatory Visit | Attending: Internal Medicine | Admitting: Internal Medicine

## 2016-11-09 ENCOUNTER — Encounter: Payer: Self-pay | Admitting: *Deleted

## 2016-11-09 ENCOUNTER — Telehealth: Payer: Self-pay | Admitting: Internal Medicine

## 2016-11-09 VITALS — BP 130/86 | HR 67 | Ht 65.0 in | Wt 182.0 lb

## 2016-11-09 DIAGNOSIS — R0609 Other forms of dyspnea: Secondary | ICD-10-CM

## 2016-11-09 DIAGNOSIS — R0600 Dyspnea, unspecified: Secondary | ICD-10-CM

## 2016-11-09 DIAGNOSIS — J449 Chronic obstructive pulmonary disease, unspecified: Secondary | ICD-10-CM

## 2016-11-09 DIAGNOSIS — J9611 Chronic respiratory failure with hypoxia: Secondary | ICD-10-CM

## 2016-11-09 DIAGNOSIS — D869 Sarcoidosis, unspecified: Secondary | ICD-10-CM

## 2016-11-09 DIAGNOSIS — J9612 Chronic respiratory failure with hypercapnia: Secondary | ICD-10-CM

## 2016-11-09 DIAGNOSIS — R0602 Shortness of breath: Secondary | ICD-10-CM | POA: Diagnosis not present

## 2016-11-09 LAB — BASIC METABOLIC PANEL
BUN: 18 mg/dL (ref 6–23)
CO2: 39 mEq/L — ABNORMAL HIGH (ref 19–32)
CREATININE: 0.79 mg/dL (ref 0.40–1.20)
Calcium: 9.9 mg/dL (ref 8.4–10.5)
Chloride: 98 mEq/L (ref 96–112)
GFR: 96.5 mL/min (ref >60.00)
Glucose, Bld: 112 mg/dL — ABNORMAL HIGH (ref 70–99)
Potassium: 4.1 mEq/L (ref 3.5–5.1)
Sodium: 142 mEq/L (ref 135–145)

## 2016-11-09 LAB — CBC WITH DIFFERENTIAL/PLATELET
BASOS ABS: 0 10*3/uL (ref 0.0–0.1)
Basophils Relative: 0.4 % (ref 0.0–3.0)
EOS ABS: 0.1 10*3/uL (ref 0.0–0.7)
Eosinophils Relative: 0.5 % (ref 0.0–5.0)
HEMATOCRIT: 35.8 % — AB (ref 36.0–46.0)
HEMOGLOBIN: 11.9 g/dL — AB (ref 12.0–15.0)
LYMPHS PCT: 10.2 % — AB (ref 12.0–46.0)
Lymphs Abs: 1.3 10*3/uL (ref 0.7–4.0)
MCHC: 33.3 g/dL (ref 30.0–36.0)
MCV: 78.8 fl (ref 78.0–100.0)
MONOS PCT: 1.5 % — AB (ref 3.0–12.0)
Monocytes Absolute: 0.2 10*3/uL (ref 0.1–1.0)
NEUTROS ABS: 11.2 10*3/uL — AB (ref 1.4–7.7)
Neutrophils Relative %: 87.4 % — ABNORMAL HIGH (ref 43.0–77.0)
PLATELETS: 362 10*3/uL (ref 150.0–400.0)
RBC: 4.55 Mil/uL (ref 3.87–5.11)
RDW: 17.2 % — ABNORMAL HIGH (ref 11.5–15.5)
WBC: 12.8 10*3/uL — AB (ref 4.0–10.5)

## 2016-11-09 LAB — SEDIMENTATION RATE: Sed Rate: 52 mm/hr — ABNORMAL HIGH (ref 0–30)

## 2016-11-09 LAB — BRAIN NATRIURETIC PEPTIDE: PRO B NATRI PEPTIDE: 40 pg/mL (ref 0.0–100.0)

## 2016-11-09 LAB — TSH: TSH: 0.66 u[IU]/mL (ref 0.35–4.50)

## 2016-11-09 MED ORDER — GLYCOPYRROLATE-FORMOTEROL 9-4.8 MCG/ACT IN AERO: 2.0000 | INHALATION_SPRAY | Freq: Two times a day (BID) | RESPIRATORY_TRACT | 0 refills | Status: DC

## 2016-11-09 NOTE — Patient Instructions (Addendum)
We will contact advanced to get your oxygen humidified   Please remember to go to the lab and x-ray department downstairs for your tests - we will call you with the results when they are available.     Stop symbicort and try bevespi Take 2 puffs first thing in am and then another 2 puffs about 12 hours later.   Only use your albuterol as a rescue medication to be used if you can't catch your breath by resting or doing a relaxed purse lip breathing pattern.  - The less you use it, the better it will work when you need it. - Ok to use up to 2 puffs  every 4 hours if you must but call for immediate appointment if use goes up over your usual need - Don't leave home without it !!  (think of it like the spare tire for your car)   See Tammy NP 4  weeks with all your medications, even over the counter meds, separated in two separate bags, the ones you take no matter what vs the ones you stop once you feel better and take only as needed when you feel you need them.   Tammy  will generate for you a new user friendly medication calendar that will put [place] all on the same page re: your medication use.     Without this process, it simply isn't possible to assure that we are providing  your outpatient care  with  the attention to detail we feel you deserve.   If we cannot assure that you're getting that kind of care,  then we cannot manage your problem effectively from this clinic.  Once you have seen Tammy and we are sure that we're all on the same page with your medication use she will arrange follow up with me.   No work Oct 04 2016 until re-revaluate next visit re ? Can you use a portable 02 system  there

## 2016-11-09 NOTE — Progress Notes (Signed)
Subjective:    Patient ID: [name], female   DOB: 12-01-1960    MRN: [id number]    Brief patient profile:  55 yobf quit smoking 10/2004 dx by transbronchial biopsy 5/92 with NCG inflammatory change consistent with sarcoid. Since that time she's been off and on prednisone multiple  times with flares of coughing dyspnea and skin involvement > weaned off chronic prednisone Feb 2012.   Overall she's been doing much better since she stopped smoking 10/2004 and much better since change to Symbicort 160/4.5 2 two times a day, able to do adl's ok without limitations including some steps. Documented GOLD III copd  07/2011   Placed back on daily steroids for new resp failure 09/2016     History of Present Illness 56/09/2014 f/u ov/Wert re: cough since late May 2015  Chief Complaint  Patient presents with  . Follow-up    Pt reports cough only minimally improved since eval here on 04/13/14. Sometimes coughs until develops HA and vomiting.    Worse at hs and in am ,states already taking omeprazole 20 mg bid ac (though very hesitant with answers re meds and does not have med calendar with action plan as rec previously  rx doxy/ pred and "no better" but no purulent sputum at all now/ actually very little mucus production unless vomits from coughng Never took hycodan Not limited by breathing from desired activities   rec Go ahead and take your cough medication to completely suppress your cough Prednisone 10 mg take  4 each am x 2 days,   2 each am x 2 days,  1 each am x 2 days and stop  Add pepcid ac 20 mg at bedtime and chlortrimeton 4 mg (over the counter) x 2 at bedtime until cough is 100% gone x 2 weeks GERD  Please see patient coordinator before you leave today  to schedule sinus CT> not done   I f not better return in 4 weeks with all meds and med calendar   08/21/2014 f/u ov/Wert re:  GOLD III copd / no med calendar  Chief Complaint  Patient presents with  . Follow-up    Pt states having  increased DOE just since this am. She was SOB walking from her car to our building today.     did not use saba on day of ov/ saba was clogged up  > rec Pulm rehab> never rehab      05/11/2016  f/u ov/Wert re: GOLD III / symb 160 2bid  Chief Complaint  Patient presents with  . Follow-up    Breathing is overall doing well and she rarely uses rescue inhaler. Needs refills on Symbicort.   working out at the gym but not much aerobics   No tendency to aecopd  rec No change rx/ no need for prednisone   2 weeks prior to Tgiving 2017  progressive doe indolent onset some am cough    Admit date: 10/06/2016 Discharge date: 10/10/2016  Admitted From: Home Disposition:  Home  Recommendations for Outpatient Follow-up:  1. Follow up with PCP in 1-2 weeks 2. Follow up with Pulmonary as scheduled  Equipment/Devices:Home O2    Discharge Condition:Stable CODE STATUS:Full Diet recommendation: Heart healthy   Brief/Interim Summary: 55 y.o.woman with a history of COPD, pulmonary sarcoidosis, HTN, breast cancer, and depression who presents to the ED for evaluation of progressive shortness of breath at rest and dyspnea on exertion. Symptoms have been progressive over the past 1-2 weeks. She has had  intermittent cough with thick white sputum production (primarily in the mornings). She denies fevers, chills, or sweats. No light-headedness or syncope. No swelling. She has not taken anything over the counter or received any prescriptions as an outpatient for these symptoms. She uses her Symbicort inhaler twice daily and she has an albuterol rescue inhaler at baseline  COPD exacerbation with acute hypoxic respiratory failure complicated by known history of sarcoidosis with mildly progressive changes on CT scan of her chest. -Patient reports overall improvement since admission -Lungs remain clear without audible wheezing -patient remains stable, however continues to desaturate on minimal  exertion -have consulted pulmonary for assistance -Patient found to have neg respiratory viral culture, unremarkable 2d echo -Pulmonary recommended trial of lasix x 1 dose with good urine output, however patient remains supplemental O2 dependent -Pulmonary recommends 2-3 week prednisone taper -See nursing documentation, Patient requires continuous home O2  Atypical chest discomfort in the setting of hypoxia -Remains chest pain-free -This admission, serial troponins were negative 3 -Presenting EKG unremarkable  -Likely chest wall pain secondary to COPD  HTN -Blood pressure remained stable -Patient continued on metoprolol and hydrochlorothiazide  GERD -Remains stable currently -Continue pantoprazole  History of breast cancer --Continue Arimidex Currently stable  Depression --Continue Zoloft Appear stable at present  Discharge Diagnoses:    COPD exacerbation ([place])    Sarcoidosis ([place])   HYPERTENSION, BENIGN SYSTEMIC   Depression with anxiety   Difficulty in walking, not elsewhere classified   Acute on chronic respiratory failure ([place])   Malnutrition of moderate degree    10/27/2016 extended post hosp f/u ov/Wert/ transition of care  re: Sarcoid/  GOLD III copd/ symb 160 2bid  Chief Complaint  Patient presents with  . Follow-up    little troble breathing when decreased prednisone down to 10mg , hard to maneuver around the house even on oxygen at 2L  feels better on 02 and d/c on pred 60 > 20 and fine until reduced to 10 x 4 days then worse sob  Prior to reducing the reducing the dose of prednisone able to walk around home s 02 and now desat 70s on 02  Different from sarcoid because with sarcoid had assoc cough / similar to sarcoid in terms of appetite, worse off it/ better on it  rec Resume prednisone 20 mg daily until next office visit Late add: If she is going to continue to need systemic steroids, a good option for her might be lama/laba s the the ics eg  BEVESPI, will  Re-consider at next ov    11/09/2016  f/u ov/Wert re:   Sarcoid / GOLD III pred still on 20 mg daily  Chief Complaint  Patient presents with  . Follow-up    Breathing is unchanged. She noticed some wheezing a day ago. She is using albuterol inhaler 2 x per wk on average.    doe = MMRC3 = can't walk 100 yards even at a slow pace at a flat grade s stopping due to sob  Even on 02   No obvious day to day or daytime variabilty or assoc excess/ purulent sputum or mucus plugs  or cp or chest tightness, subjective wheeze overt sinus or hb symptoms. No unusual exp hx or h/o childhood pna/ asthma or knowledge of premature birth.  Sleeping ok without nocturnal  or early am exacerbation  of respiratory  c/o's or need for noct saba. Also denies any obvious fluctuation of symptoms with weather or environmental changes or other aggravating or alleviating factors  except as outlined above   Current Medications, Allergies, Complete Past Medical History, Past Surgical History, Family History, and Social History were reviewed in [employer] record.  ROS  The following are not active complaints unless bolded sore throat, dysphagia, dental problems, itching, sneezing,  nasal congestion or excess/ purulent secretions, ear ache,   fever, chills, sweats, unintended wt loss, pleuritic or exertional cp, hemoptysis,  orthopnea pnd or leg swelling, presyncope, palpitations, abdominal pain, anorexia, nausea, vomiting, diarrhea  or change in bowel or urinary habits, change in stools or urine, dysuria,hematuria,  rash, arthralgias, visual complaints, headache, numbness weakness or ataxia or problems with walking or coordination,  change in mood/affect or memory.                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 Unice Cobble  -h/o Daily prednisone since 2007   > off completely Feb 2012 with no problem ? 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 (ICD-280.9)  Health Maintenance.......................................................   [facility]   Family History:  crohn`s in daughter, M-lupus, htn, asthma,  no Ca, DM, CAD    Social History:   lives with twin children and grandson. single; >20 pack year history, quit in 2005; no EtOH  Laid off  from works for [employer] Sept 2013           Objective:   Physical Exam wt 166 Oct 12, 2009 >170 October 28, 2010 > 169 06/29/2011 >>171 08/24/2011 > 01/15/2012  171 > 04/24/2012 176 > 06/28/2012  175> 08/13/2012 > 08/30/2012  173 > 170 11/28/2012 > 01/29/2013  176 > 174 >173 06/16/2013 > 171 09/18/2013 > 12/19/2013 177 >179 04/13/2014>  04/23/2014 177 > 08/21/2014 184 >182 11/27/2014 > 05/12/2015 177  > 05/11/2016   177 > 10/27/2016  175 > 11/09/2016 182   amb bf nad / vital signs reviewed   - Note on arrival 02 sats  96% on 2lpm     HEENT: nl dentition, turbinates, and oropharynx. Nl external ear canals without cough reflex   NECK :  without JVD/Nodes/TM/ nl carotid upstrokes bilaterally   LUNGS: no acc muscle use, very minimal crackles in bases bilaterally/ min exp rhonchi s cough on insp/exp    CV:  RRR  no s3 or murmur or increase in P2, no edema   ABD:  soft and nontender with nl excursion in the supine position. No bruits or organomegaly, bowel sounds  nl  MS:  warm without deformities, calf tenderness, cyanosis or clubbing  SKIN: warm and dry without lesions    NEURO:  alert, approp, no deficits      CXR PA and Lateral:   11/09/2016 :    I personally reviewed images and agree with radiology impression as follows:    Stable bibasilar scarring. No significant change compared to prior exam.  Labs ordered/ reviewed:      Chemistry      Component Value Date/Time   NA 142 11/09/2016 1243   K 4.1 11/09/2016 1243   CL 98 11/09/2016 1243   CO2 39 (H) 11/09/2016 1243   BUN 18 11/09/2016 1243   CREATININE 0.79 11/09/2016 1243   CREATININE 0.64 02/04/2015 1619      Component Value Date/Time   CALCIUM 9.9 11/09/2016 1243   ALKPHOS 70 10/07/2016 0522   AST 14 (L) 10/07/2016 0522   ALT 10 (L) 10/07/2016 0522   BILITOT 0.6 10/07/2016 0522        Lab Results  Component Value Date   WBC 12.8 (H) 11/09/2016   HGB 11.9 (L) 11/09/2016   HCT 35.8 (L) 11/09/2016   MCV 78.8 11/09/2016   PLT 362.0 11/09/2016     Lab Results  Component Value Date   DDIMER 0.34 11/09/2016      Lab Results  Component Value Date   TSH 0.66 11/09/2016     Lab Results  Component Value Date   PROBNP 40.0 11/09/2016       Lab Results  Component Value Date   ESRSEDRATE 52 (H) 11/09/2016   ESRSEDRATE 70 (H) 10/09/2016                 . Assessment:

## 2016-11-09 NOTE — Telephone Encounter (Signed)
Spoke with the pt to verify the msg  I have faxed letter to her employer  Nothing further needed

## 2016-11-10 LAB — D-DIMER, QUANTITATIVE (NOT AT ARMC): D DIMER QUANT: 0.34 ug{FEU}/mL (ref <0.50)

## 2016-11-11 NOTE — Assessment & Plan Note (Signed)
Quit smoking 2005  - PFT's 06/26/08 FEV1 1.09 ratio  - PFTs 03/02/10 FEV1 1.06 ratio 34    PFT's 08/01/2011 FEV1 1.6 (42%)  41 ratio  DLCO 55 corrects 77 - Spiriva trial March 02, 2010 > ? better but not worse off it 04/2011 - 05/12/2015 p extensive coaching HFA effectiveness =    90%  - Referred to Rehab 08/21/2014 > could not arrange due to schedule  -med calendar 06/16/2013 > did not bring to office as requested 12/19/13 or 04/23/14  Or 08/21/2014    - 11/09/2016   After extensive coaching HFA effectiveness = 90%   changed symb to bevespi since taking chronic pred for pf   Pt is Group B in terms of symptom/risk and laba/lama therefore appropriate rx at this point so approp for bevespi trial   I had an extended discussion with the patient reviewing all relevant studies completed to date and  lasting 15 to 20 minutes of a 25 minute visit    Each maintenance medication was reviewed in detail including most importantly the difference between maintenance and prns and under what circumstances the prns are to be triggered using an action plan format that is not reflected in the computer generated alphabetically organized AVS.    Please see AVS for unique instructions that I personally wrote and verbalized to the the pt in detail and then reviewed with pt  by my nurse highlighting any  changes in therapy recommended at today's visit to their plan of care.

## 2016-11-11 NOTE — Assessment & Plan Note (Signed)
Multifactorial but ruled out chf/ anemia/ thyroid dz and dvt/pe on today's labs / reviewed by phone

## 2016-11-11 NOTE — Assessment & Plan Note (Signed)
Positive transbronchial biopsy 02/19/91 by Dr. Unice Cobble  -Daily prednisone since 2007 with ? adrenal insufficiency iatrogenic > off completely Feb 2012 with no recurrence - PFT's 06/26/08 FEV1  1.09 ratio  - PFTs  03/02/10 FEV1  1.06 (42%) ratio 34 and no better p B2,  DLC0 48% corrects to 64% - PFT's 08/01/2011 FEV1 1.6 (42%)  41 ratio  DLCO 55 corrects 77 - PFTs 10/10/16    FEV1  0.87(38)ratio 41 and DLCO 36/39c corrects to 51 for alv vol > placed back on daily pred      The goal with a chronic steroid dependent illness is always arriving at the lowest effective dose that controls the disease/symptoms and not accepting a set "formula" which is based on statistics or guidelines that don't always take into account patient  variability or the natural hx of the dz in every individual patient, which may well vary over time.  For now therefore I recommend the patient maintain  20 mg per day until better then try 20/10 until returns (new floor for now)

## 2016-11-11 NOTE — Assessment & Plan Note (Addendum)
New start 09/2016 at admit see records - 10/18/16 Patient Saturations on Room Air at Rest = 92% Patient Saturations on Room Air while Ambulating = 84% Patient Saturations on 2 Liters of oxygen while Ambulating = 90% - HCO3  11/09/2016 = 39   Adequate control on present rx, reviewed in detail with pt > no change in rx needed  > goal is low 90's given tendency to retain co2 but as long as no am ha/ ams then ok to let run higher for now as appears to so well compensated   Will see if qualifies for POC/ return to work / also needs noct humidity

## 2016-11-14 ENCOUNTER — Telehealth: Payer: Self-pay | Admitting: Internal Medicine

## 2016-11-14 NOTE — Telephone Encounter (Signed)
Done

## 2016-11-14 NOTE — Telephone Encounter (Signed)
Forms in [place]  Will give back to Henry once done, thanks

## 2016-11-14 NOTE — Telephone Encounter (Signed)
Rec'd forms back from [place] completed forms to [employer] via interoffice mail - 11/14/2016 -pr

## 2016-11-15 ENCOUNTER — Telehealth: Payer: Self-pay | Admitting: Internal Medicine

## 2016-11-15 NOTE — Telephone Encounter (Signed)
Rec'd fax from [facility] for updated on short term disability. Fwd to [employer] via interoffice mail -11/15/16-pr

## 2016-12-07 ENCOUNTER — Ambulatory Visit: Payer: BLUE CROSS/BLUE SHIELD | Admitting: Adult Health

## 2016-12-07 ENCOUNTER — Telehealth: Payer: Self-pay | Admitting: Internal Medicine

## 2016-12-07 MED ORDER — GLYCOPYRROLATE-FORMOTEROL 9-4.8 MCG/ACT IN AERO: 2.0000 | INHALATION_SPRAY | Freq: Two times a day (BID) | RESPIRATORY_TRACT | 0 refills | Status: AC

## 2016-12-07 NOTE — Telephone Encounter (Signed)
Samples of bevespi has been placed up front for pick up. Pt aware and voiced her understanding. Nothing further needed.

## 2016-12-08 ENCOUNTER — Telehealth: Payer: Self-pay | Admitting: Internal Medicine

## 2016-12-08 MED ORDER — TRIAMTERENE-HCTZ 37.5-25 MG PO TABS: 1.0000 | ORAL_TABLET | Freq: Every day | ORAL | 1 refills | Status: DC

## 2016-12-08 NOTE — Telephone Encounter (Signed)
MW  Please Advise-  Pt. Is concerned that she has noticed her legs have been really swollen and has some tightness in them. She is still on prednisone that she was placed on when she got out the hospital. She is now down to 20mg  of prednisone daily. She wanted to know what should she do. She does have a follow up appointment with TP 12/12/16. She denies any issues with her breathing at this time.

## 2016-12-08 NOTE — Telephone Encounter (Signed)
D/c the hydordiuril and start maxzide 25 one daily until seen

## 2016-12-08 NOTE — Telephone Encounter (Signed)
Pt aware of MW's recommendations & voiced her understanding. Rx sent to preferred pharmacy. Nothing further needed.

## 2016-12-10 DIAGNOSIS — J449 Chronic obstructive pulmonary disease, unspecified: Secondary | ICD-10-CM | POA: Diagnosis not present

## 2016-12-12 ENCOUNTER — Ambulatory Visit (INDEPENDENT_AMBULATORY_CARE_PROVIDER_SITE_OTHER): Payer: BLUE CROSS/BLUE SHIELD | Admitting: Adult Health

## 2016-12-12 ENCOUNTER — Encounter: Payer: Self-pay | Admitting: *Deleted

## 2016-12-12 ENCOUNTER — Other Ambulatory Visit: Payer: Self-pay | Admitting: Hematology and Oncology

## 2016-12-12 ENCOUNTER — Encounter: Payer: Self-pay | Admitting: Adult Health

## 2016-12-12 DIAGNOSIS — C50411 Malignant neoplasm of upper-outer quadrant of right female breast: Secondary | ICD-10-CM

## 2016-12-12 DIAGNOSIS — J449 Chronic obstructive pulmonary disease, unspecified: Secondary | ICD-10-CM

## 2016-12-12 DIAGNOSIS — D869 Sarcoidosis, unspecified: Secondary | ICD-10-CM

## 2016-12-12 MED ORDER — GLYCOPYRROLATE-FORMOTEROL 9-4.8 MCG/ACT IN AERO: 2.0000 | INHALATION_SPRAY | Freq: Two times a day (BID) | RESPIRATORY_TRACT | 0 refills | Status: DC

## 2016-12-12 MED ORDER — GLYCOPYRROLATE-FORMOTEROL 9-4.8 MCG/ACT IN AERO: 2.0000 | INHALATION_SPRAY | Freq: Two times a day (BID) | RESPIRATORY_TRACT | 6 refills | Status: DC

## 2016-12-12 NOTE — Telephone Encounter (Signed)
I spoke with Diamond Nickel at [employer] and she did not receive a form from [place] via inter office mail.  Pt was in the office today stating that Christella Scheuermann needs further info to continue her disability.  Diamond Nickel is not aware of what else is needed. I left a message with Clarise Cruz at [employer] [phone]) to see what other info is needed.  Pt left forms with me  (they are at my desk). Neka in [employer] asked that we update her when we find out what else is needed.  Also need to update pt when we find out.

## 2016-12-12 NOTE — Assessment & Plan Note (Signed)
Improved sx control on BEVESPI   Plan  Patient Instructions  Alternate Prednisone 20mg  and 10mg  daily for 1 week then 10mg  daily and hold at this dose  Follow med calendar closely and bring to each visit.  Remain out of work until 01/17/17 .  Continue on Oxygen 2l/m with activity .  follow up Dr. Melvyn Novas  In 4 weeks and As needed   Please contact office for sooner follow up if symptoms do not improve or worsen or seek emergency care

## 2016-12-12 NOTE — Progress Notes (Signed)
@Patient  ID: [name], female    DOB: 04/02/1961, 56 y.o.   MRN: [id number]  Chief Complaint  Patient presents with  . Follow-up    COPD     Referring provider: Caryl Never*  HPI: 56 yo female former smoker followed for sarcoid and GOLD III COPD , O2 dependent RF   TEST  transbronchial biopsy 5/92 with NCG inflammatory change consistent with sarcoid PFT's 06/26/08 FEV1 1.09 ratio  - PFTs 03/02/10 FEV1 1.06 ratio 34    PFT's 08/01/2011 FEV1 1.6 (42%)  41 ratio  DLCO 55 corrects 77  PFTs 10/10/16    FEV1  0.87(38)ratio 41 and DLCO 36/39c corrects to 51 for alv vol > placed back on daily pred   12/12/2016 Follow up ; COPD/Sarcoid /O2 RF  Pt returns for 1 month follow up . Seen last ov with change to BEVESPI and continue on chronic steroids. She is currently on prednisone 20mg  daily .  She remains on O2 at 2l/m with act. She has not been able to go back to work and is on STD . She is waiting on paperwork to be filled out.  She gets sob with walking around but O2 helps her.  Feels new inhaler BEVESPI is helping as well.  We reviewed all her meds and organized them into a med calendar with pt education .  She has some dry cough . No fever or discolored mucus.    Allergies  Allergen Reactions  . Codeine Other (See Comments)    Avoids-felt bad when took before    Immunization History  Administered Date(s) Administered  . Influenza Split 08/22/2012, 08/13/2014  . Influenza Whole 10/25/2007, 08/13/2010, 07/30/2011  . Influenza,inj,Quad PF,36+ Mos 07/30/2013, 09/20/2015, 08/10/2016  . Pneumococcal Polysaccharide-23 11/14/2007  . Td 06/13/2004  . Tdap 06/15/2014    Past Medical History:  Diagnosis Date  . Allergic rhinitis   . COPD, mild ([place]) FOLLOWED BY DR Melvyn Novas  . GERD (gastroesophageal reflux disease)   . HTN (hypertension)   . Hypertension   . Iron deficiency anemia   . Long-term current use of steroids SYMBICORT INHALER  . 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  Sarcoidosis ([place]) STABLE PER CXR JUNE 2013  . Shortness of breath   . Sickle cell trait ([facility])     Tobacco History: History  Smoking Status  . Former Smoker  . Packs/day: 1.00  . Years: 20.00  . Quit date: 11/14/2003  Smokeless Tobacco  . Never Used   Counseling given: Not Answered   Outpatient Encounter Prescriptions as of 12/12/2016  Medication Sig  . acetaminophen (TYLENOL) 500 MG tablet Take 1,000 mg by mouth every 6 (six) hours as needed for fever. Reported on 05/10/2016  . albuterol (PROVENTIL HFA;VENTOLIN HFA) 108 (90 Base) MCG/ACT inhaler Inhale 2 puffs into the lungs every 6 (six) hours as needed for wheezing or shortness of breath.  . anastrozole (ARIMIDEX) 1 MG tablet Take 1 tablet (1 mg total) by mouth daily. (Patient taking differently: Take 1 mg by mouth at bedtime. )  . aspirin EC 81 MG EC tablet Take 1 tablet (81 mg total) by mouth daily.  Marland Kitchen CALCIUM PO Take 1 tablet by mouth daily.  . fluticasone (FLONASE) 50 MCG/ACT nasal spray Place 2 sprays into both nostrils daily as needed for allergies.  Marland Kitchen gabapentin (NEURONTIN) 100 MG capsule Take 1 capsule (100 mg total) by mouth at bedtime.  . Glycopyrrolate-Formoterol (BEVESPI AEROSPHERE) 9-4.8 MCG/ACT AERO Inhale 2 puffs into the lungs  2 (two) times daily.  . metoprolol tartrate (LOPRESSOR) 25 MG tablet Take 1 tablet (25 mg total) by mouth 2 (two) times daily.  . Multiple Vitamin (MULTIVITAMIN) capsule Take 1 capsule by mouth daily.  Marland Kitchen omeprazole (PRILOSEC) 20 MG capsule Take 1 capsule (20 mg total) by mouth daily.  . predniSONE (DELTASONE) 10 MG tablet Take  2 each am  . sertraline (ZOLOFT) 50 MG tablet Take 1 tablet (50 mg total) by mouth daily. (Patient taking differently: Take 50 mg by mouth at bedtime. )  . triamterene-hydrochlorothiazide (MAXZIDE-25) 37.5-25 MG tablet Take 1 tablet by mouth daily.  . [DISCONTINUED] Glycopyrrolate-Formoterol (BEVESPI AEROSPHERE) 9-4.8 MCG/ACT AERO Inhale 2 puffs into the lungs 2 (two)  times daily.  . [DISCONTINUED] Glycopyrrolate-Formoterol (BEVESPI AEROSPHERE) 9-4.8 MCG/ACT AERO Inhale 2 puffs into the lungs 2 (two) times daily.   No facility-administered encounter medications on file as of 12/12/2016.      Review of Systems  Constitutional:   No  weight loss, night sweats,  Fevers, chills,  +fatigue, or  lassitude.  HEENT:   No headaches,  Difficulty swallowing,  Tooth/dental problems, or  Sore throat,                No sneezing, itching, ear ache, + nasal congestion, post nasal drip,   CV:  No chest pain,  Orthopnea, PND, swelling in lower extremities, anasarca, dizziness, palpitations, syncope.   GI  No heartburn, indigestion, abdominal pain, nausea, vomiting, diarrhea, change in bowel habits, loss of appetite, bloody stools.   Resp:    No chest wall deformity  Skin: no rash or lesions.  GU: no dysuria, change in color of urine, no urgency or frequency.  No flank pain, no hematuria   MS:  No joint pain or swelling.  No decreased range of motion.  No back pain.    Physical Exam  BP (!) 150/80   Pulse (!) 109   Temp 97.4 F (36.3 C) (Oral)   Ht 5\' 5"  (1.651 m)   SpO2 94%   GEN: A/Ox3; pleasant , NAD   HEENT:  E[place]/AT,  EACs-clear, TMs-wnl, NOSE-clear, THROAT-clear, no lesions, no postnasal drip or exudate noted.   NECK:  Supple w/ fair ROM; no JVD; normal carotid impulses w/o bruits; no thyromegaly or nodules palpated; no lymphadenopathy.    RESP  Decreased BS in bases ; w/o, wheezes/ rales/ or rhonchi. no accessory muscle use, no dullness to percussion  CARD:  RRR, no m/r/g, no peripheral edema, pulses intact, no cyanosis or clubbing.  GI:   Soft & nt; nml bowel sounds; no organomegaly or masses detected.   Musco: Warm bil, no deformities or joint swelling noted.   Neuro: alert, no focal deficits noted.    Skin: Warm, no lesions or rashes  Psych:  No change in mood or affect. No depression or anxiety.  No memory loss.  Lab  Results:  CBC   BNP  Imaging: No results found.   Assessment & Plan:   Sarcoidosis ([place]) Recent flare -w/ increased changes on CT chest 11/20187 and  drop in FEV1 -now on chronic steroids  Try to decrease dose slowly   Plan  Patient Instructions  Alternate Prednisone 20mg  and 10mg  daily for 1 week then 10mg  daily and hold at this dose  Follow med calendar closely and bring to each visit.  Remain out of work until 01/17/17 .  Continue on Oxygen 2l/m with activity .  follow up Dr. Melvyn Novas  In 4 weeks and  As needed   Please contact office for sooner follow up if symptoms do not improve or worsen or seek emergency care      COPD  GOLD III Improved sx control on BEVESPI   Plan  Patient Instructions  Alternate Prednisone 20mg  and 10mg  daily for 1 week then 10mg  daily and hold at this dose  Follow med calendar closely and bring to each visit.  Remain out of work until 01/17/17 .  Continue on Oxygen 2l/m with activity .  follow up Dr. Melvyn Novas  In 4 weeks and As needed   Please contact office for sooner follow up if symptoms do not improve or worsen or seek emergency care         Rexene Edison, NP 12/12/2016

## 2016-12-12 NOTE — Patient Instructions (Addendum)
Alternate Prednisone 20mg  and 10mg  daily for 1 week then 10mg  daily and hold at this dose  Follow med calendar closely and bring to each visit.  Remain out of work until 01/17/17 .  Continue on Oxygen 2l/m with activity .  follow up Dr. Melvyn Novas  In 4 weeks and As needed   Please contact office for sooner follow up if symptoms do not improve or worsen or seek emergency care

## 2016-12-12 NOTE — Assessment & Plan Note (Signed)
Recent flare -w/ increased changes on CT chest 11/20187 and  drop in FEV1 -now on chronic steroids  Try to decrease dose slowly   Plan  Patient Instructions  Alternate Prednisone 20mg  and 10mg  daily for 1 week then 10mg  daily and hold at this dose  Follow med calendar closely and bring to each visit.  Remain out of work until 01/17/17 .  Continue on Oxygen 2l/m with activity .  follow up Dr. Melvyn Novas  In 4 weeks and As needed   Please contact office for sooner follow up if symptoms do not improve or worsen or seek emergency care

## 2016-12-13 ENCOUNTER — Other Ambulatory Visit: Payer: Self-pay | Admitting: Emergency Medicine

## 2016-12-13 DIAGNOSIS — C50411 Malignant neoplasm of upper-outer quadrant of right female breast: Secondary | ICD-10-CM

## 2016-12-13 MED ORDER — ANASTROZOLE 1 MG PO TABS: 1.0000 mg | ORAL_TABLET | Freq: Every day | ORAL | 3 refills | Status: DC

## 2016-12-13 NOTE — Progress Notes (Signed)
Chart and office note reviewed in detail  > agree with a/p as outlined    

## 2016-12-19 NOTE — Addendum Note (Signed)
Addended by: Parke Poisson E on: 12/19/2016 09:57 AM   Modules accepted: Orders

## 2016-12-28 ENCOUNTER — Other Ambulatory Visit: Payer: Self-pay | Admitting: Hematology and Oncology

## 2016-12-28 DIAGNOSIS — N951 Menopausal and female climacteric states: Secondary | ICD-10-CM

## 2017-01-09 ENCOUNTER — Encounter: Payer: Self-pay | Admitting: Internal Medicine

## 2017-01-09 ENCOUNTER — Ambulatory Visit (INDEPENDENT_AMBULATORY_CARE_PROVIDER_SITE_OTHER): Payer: BLUE CROSS/BLUE SHIELD | Admitting: Internal Medicine

## 2017-01-09 VITALS — BP 124/82 | HR 80 | Ht 65.0 in | Wt 198.0 lb

## 2017-01-09 DIAGNOSIS — J449 Chronic obstructive pulmonary disease, unspecified: Secondary | ICD-10-CM

## 2017-01-09 DIAGNOSIS — J9611 Chronic respiratory failure with hypoxia: Secondary | ICD-10-CM

## 2017-01-09 DIAGNOSIS — J9612 Chronic respiratory failure with hypercapnia: Secondary | ICD-10-CM | POA: Diagnosis not present

## 2017-01-09 DIAGNOSIS — D869 Sarcoidosis, unspecified: Secondary | ICD-10-CM | POA: Diagnosis not present

## 2017-01-09 NOTE — Progress Notes (Signed)
Subjective:    Patient ID: [name], female   DOB: 09-05-1961    MRN: [id number]    Brief patient profile:  56 yobf quit smoking 10/2004 dx by transbronchial biopsy 5/92 with NCG inflammatory change consistent with sarcoid. Since that time she's been off and on prednisone multiple  times with flares of coughing dyspnea and skin involvement > weaned off chronic prednisone Feb 2012.   Overall she's been doing much better since she stopped smoking 10/2004 and much better since change to Symbicort 160/4.5 2 two times a day, able to do adl's ok without limitations including some steps. Documented GOLD III copd  07/2011   Placed back on daily steroids for new resp failure 09/2016     History of Present Illness 04/23/2014 f/u ov/[name] re: cough since late May 2015  Chief Complaint  Patient presents with  . Follow-up    Pt reports cough only minimally improved since eval here on 04/13/14. Sometimes coughs until develops HA and vomiting.    Worse at hs and in am ,states already taking omeprazole 20 mg bid ac (though very hesitant with answers re meds and does not have med calendar with action plan as rec previously  rx doxy/ pred and "no better" but no purulent sputum at all now/ actually very little mucus production unless vomits from coughng Never took hycodan Not limited by breathing from desired activities   rec Go ahead and take your cough medication to completely suppress your cough Prednisone 10 mg take  4 each am x 2 days,   2 each am x 2 days,  1 each am x 2 days and stop  Add pepcid ac 20 mg at bedtime and chlortrimeton 4 mg (over the counter) x 2 at bedtime until cough is 100% gone x 2 weeks GERD  Please see patient coordinator before you leave today  to schedule sinus CT> not done   I f not better return in 4 weeks with all meds and med calendar   08/21/2014 f/u ov/[name] re:  GOLD III copd / no med calendar  Chief Complaint  Patient presents with  . Follow-up    Pt states  having increased DOE just since this am. She was SOB walking from her car to our building today.     did not use saba on day of ov/ saba was clogged up  > rec Pulm rehab> never rehab      05/11/2016  f/u ov/[name] re: GOLD III / symb 160 2bid  Chief Complaint  Patient presents with  . Follow-up    Breathing is overall doing well and she rarely uses rescue inhaler. Needs refills on Symbicort.   working out at the gym but not much aerobics   No tendency to aecopd  rec No change rx/ no need for prednisone   2 weeks prior to Tgiving 2017  progressive doe indolent onset some am cough    Admit date: 10/06/2016 Discharge date: 10/10/2016  Admitted From: Home Disposition:  Home  Recommendations for Outpatient Follow-up:  1. Follow up with PCP in 1-2 weeks 2. Follow up with Pulmonary as scheduled  Equipment/Devices:Home O2    Discharge Condition:Stable CODE STATUS:Full Diet recommendation: Heart healthy   Brief/Interim Summary: 56 y.o.woman with a history of COPD, pulmonary sarcoidosis, HTN, breast cancer, and depression who presents to the ED for evaluation of progressive shortness of breath at rest and dyspnea on exertion. Symptoms have been progressive over the past 1-2 weeks. She has had  intermittent cough with thick white sputum production (primarily in the mornings). She denies fevers, chills, or sweats. No light-headedness or syncope. No swelling. She has not taken anything over the counter or received any prescriptions as an outpatient for these symptoms. She uses her Symbicort inhaler twice daily and she has an albuterol rescue inhaler at baseline  COPD exacerbation with acute hypoxic respiratory failure complicated by known history of sarcoidosis with mildly progressive changes on CT scan of her chest. -Patient reports overall improvement since admission -Lungs remain clear without audible wheezing -patient remains stable, however continues to desaturate on  minimal exertion -have consulted pulmonary for assistance -Patient found to have neg respiratory viral culture, unremarkable 2d echo -Pulmonary recommended trial of lasix x 1 dose with good urine output, however patient remains supplemental O2 dependent -Pulmonary recommends 2-3 week prednisone taper -See nursing documentation, Patient requires continuous home O2  Atypical chest discomfort in the setting of hypoxia -Remains chest pain-free -This admission, serial troponins were negative 3 -Presenting EKG unremarkable  -Likely chest wall pain secondary to COPD  HTN -Blood pressure remained stable -Patient continued on metoprolol and hydrochlorothiazide  GERD -Remains stable currently -Continue pantoprazole  History of breast cancer --Continue Arimidex Currently stable  Depression --Continue Zoloft Appear stable at present  Discharge Diagnoses:    COPD exacerbation ([place])    Sarcoidosis ([place])   HYPERTENSION, BENIGN SYSTEMIC   Depression with anxiety   Difficulty in walking, not elsewhere classified   Acute on chronic respiratory failure ([place])   Malnutrition of moderate degree    10/27/2016 extended post hosp f/u ov/[name]/ transition of care  re: Sarcoid/  GOLD III copd/ symb 160 2bid  Chief Complaint  Patient presents with  . Follow-up    little troble breathing when decreased prednisone down to 10mg , hard to maneuver around the house even on oxygen at 2L  feels better on 02 and d/c on pred 60 > 20 and fine until reduced to 10 x 4 days then worse sob  Prior to reducing the reducing the dose of prednisone able to walk around home s 02 and now desat 70s on 02  Different from sarcoid because with sarcoid had assoc cough / similar to sarcoid in terms of appetite, worse off it/ better on it  rec Resume prednisone 20 mg daily until next office visit Late add: If she is going to continue to need systemic steroids, a good option for her might be lama/laba s the the ics  eg BEVESPI, will  Re-consider at next ov    11/09/2016  f/u ov/[name] re:   Sarcoid / GOLD III pred still on 20 mg daily  Chief Complaint  Patient presents with  . Follow-up    Breathing is unchanged. She noticed some wheezing a day ago. She is using albuterol inhaler 2 x per wk on average.    doe = MMRC3 = can't walk 100 yards even at a slow pace at a flat grade s stopping due to sob  Even on 02  rec We will contact advanced to get your oxygen humidified  Stop symbicort and try bevespi Take 2 puffs first thing in am and then another 2 puffs about 12 hours later.  Only use your albuterol as a rescue medication    01/09/2017  f/u ov/[name] re:  GOLD III/ pred 10 and bevespi 2 bid / alb avg  Chief Complaint  Patient presents with  . Follow-up    Pt states breathing is "a little rough for  me today".  She is concerned about wt gain.   doe still = MMRC3  No better p sab  No obvious day to day or daytime variabilty or assoc excess/ purulent sputum or mucus plugs  or cp or chest tightness, subjective wheeze overt sinus or hb symptoms. No unusual exp hx or h/o childhood pna/ asthma or knowledge of premature birth.  Sleeping ok without nocturnal  or early am exacerbation  of respiratory  c/o's or need for noct saba. Also denies any obvious fluctuation of symptoms with weather or environmental changes or other aggravating or alleviating factors except as outlined above   Current Medications, Allergies, Complete Past Medical History, Past Surgical History, Family History, and Social History were reviewed in [employer] record.  ROS  The following are not active complaints unless bolded sore throat, dysphagia, dental problems, itching, sneezing,  nasal congestion or excess/ purulent secretions, ear ache,   fever, chills, sweats, unintended wt loss, pleuritic or exertional cp, hemoptysis,  orthopnea pnd or leg swelling, presyncope, palpitations, abdominal pain, anorexia, nausea,  vomiting, diarrhea  or change in bowel or urinary habits, change in stools or urine, dysuria,hematuria,  rash, arthralgias, visual complaints, headache, numbness weakness or ataxia or problems with walking or coordination,  change in mood/affect or memory.                 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 Unice Cobble  -h/o Daily prednisone since 2007   > off completely Feb 2012 with no problem ? 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 (ICD-280.9)  Health Maintenance.......................................................   [facility]   Family History:  crohn`s in daughter, M-lupus, htn, asthma,  no Ca, DM, CAD    Social History:   lives with twin children and grandson. single; >20 pack year history, quit in 2005; no EtOH  Laid off  from works for [employer] Sept 2013           Objective:   Physical Exam wt 166 Oct 12, 2009 >170 October 28, 2010 > 169 06/29/2011 >>171 08/24/2011 > 01/15/2012  171 > 04/24/2012 176 > 06/28/2012  175> 08/13/2012 > 08/30/2012  173 > 170 11/28/2012 > 01/29/2013  176 > 174 >173 06/16/2013 > 171 09/18/2013 > 12/19/2013 177 >179 04/13/2014>  04/23/2014 177 > 08/21/2014 184 >182 11/27/2014 > 05/12/2015 177  > 05/11/2016   177 > 10/27/2016  175 > 11/09/2016 182 > 01/09/2017 198   amb bf nad / vital signs  reviewed   - Note on arrival 02 sats  94% on 2lpm   HEENT: nl   turbinates, and oropharynx. Nl external ear canals without cough reflex - edentulous    NECK :  without JVD/Nodes/TM/ nl carotid upstrokes bilaterally   LUNGS: no acc muscle use,    CV:  RRR  no s3 or murmur or  increase in P2, no edema   ABD:  soft and nontender with nl excursion in the supine position. No bruits or organomegaly, bowel sounds nl  MS:  warm without deformities, calf tenderness, cyanosis or clubbing  SKIN: warm and dry without lesions    NEURO:  alert, approp, no deficits               . Assessment:

## 2017-01-09 NOTE — Patient Instructions (Signed)
Add pepcid 20 mg at bedtime  Add 02 2lpm at bedtime and continue to adjust daytime to saturations greater than 90%   See calendar for specific medication instructions and bring it back for each and every office visit for every healthcare provider you see.  Without it,  you may not receive the best quality medical care that we feel you deserve.  You will note that the calendar groups together  your maintenance  medications that are timed at particular times of the day.  Think of this as your checklist for what your doctor has instructed you to do until your next evaluation to see what benefit  there is  to staying on a consistent group of medications intended to keep you well.  The other group at the bottom is entirely up to you to use as you see fit  for specific symptoms that may arise between visits that require you to treat them on an as needed basis.  Think of this as your action plan or "what if" list.   Separating the top medications from the bottom group is fundamental to providing you adequate care going forward.    Please schedule a follow up office visit in 6 weeks, call sooner if needed

## 2017-01-10 DIAGNOSIS — E669 Obesity, unspecified: Secondary | ICD-10-CM | POA: Insufficient documentation

## 2017-01-10 DIAGNOSIS — J449 Chronic obstructive pulmonary disease, unspecified: Secondary | ICD-10-CM | POA: Diagnosis not present

## 2017-01-10 NOTE — Assessment & Plan Note (Signed)
Quit smoking 2005  - PFT's 06/26/08 FEV1 1.09 ratio  - PFTs 03/02/10 FEV1 1.06 ratio 34    PFT's 08/01/2011 FEV1 1.6 (42%)  41 ratio  DLCO 55 corrects 77 - Spiriva trial March 02, 2010 > ? better but not worse off it 04/2011 - 05/12/2015 p extensive coaching HFA effectiveness =    90%  - Referred to Rehab 08/21/2014 > could not arrange due to schedule  -med calendar 06/16/2013 > did not bring to office as requested 12/19/13 or 04/23/14  Or 08/21/2014  - 11/09/2016   After extensive coaching HFA effectiveness = 90%   changed symb to bevespi since taking chronic pred for pf   Appears well compensated and note that no better on prednisone at higher doses nor with albuterol supplements so I believe she is on the right combination for now and needs to work harder on weight loss.  I had an extended discussion with the patient reviewing all relevant studies completed to date and  lasting 15 to 20 minutes of a 25 minute visit    Each maintenance medication was reviewed in detail including most importantly the difference between maintenance and prns and under what circumstances the prns are to be triggered using an action plan format that is not reflected in the computer generated alphabetically organized AVS but trather by a customized med calendar that reflects the AVS meds with confirmed 100% correlation.   In addition, Please see AVS for unique instructions that I personally wrote and verbalized to the the pt in detail and then reviewed with pt  by my nurse highlighting any  changes in therapy recommended at today's visit to their plan of care.  Marland Kitchen

## 2017-01-10 NOTE — Assessment & Plan Note (Signed)
New start 09/2016 at admit see records - 10/18/16 Patient Saturations on Room Air at Rest = 92% Patient Saturations on Room Air while Ambulating = 84% Patient Saturations on 2 Liters of oxygen while Ambulating = 90% - HCO3  11/09/2016 = 39   As of  01/09/2017 rec 2lpm sleeping and titrate with ambulation to maintain > 90%

## 2017-01-10 NOTE — Assessment & Plan Note (Signed)
Body mass index is 32.95  .  trending up probably partly due to prednisone  Lab Results  Component Value Date   TSH 0.66 11/09/2016     Contributing to gerd risk/ doe/reviewed the need and the process to achieve and maintain neg calorie balance > defer f/u primary care including intermittently monitoring thyroid status

## 2017-01-10 NOTE — Assessment & Plan Note (Signed)
Positive transbronchial biopsy 02/19/91 by Dr. Unice Cobble  -Daily prednisone since 2007 with ? adrenal insufficiency iatrogenic > off completely Feb 2012 with no recurrence - PFT's 06/26/08 FEV1  1.09 ratio  - PFTs  03/02/10 FEV1  1.06 (42%) ratio 34 and no better p B2,  DLC0 48% corrects to 64% - PFT's 08/01/2011 FEV1 1.6 (42%)  41 ratio  DLCO 55 corrects 77 - PFTs 10/10/16    FEV1  0.87(38)ratio 41 and DLCO 36/39c corrects to 51 for alv vol > placed back on daily pred   The goal with a chronic steroid dependent illness is always arriving at the lowest effective dose that controls the disease/symptoms and not accepting a set "formula" which is based on statistics or guidelines that don't always take into account patient  variability or the natural hx of the dz in every individual patient, which may well vary over time.  For now therefore I recommend the patient maintain  10 mg daily for now

## 2017-01-21 ENCOUNTER — Telehealth: Payer: Self-pay

## 2017-01-21 NOTE — Telephone Encounter (Signed)
Spoke with patient and she is aware oh her new appt due to 3/21 cme  [name]

## 2017-01-31 ENCOUNTER — Ambulatory Visit: Payer: BLUE CROSS/BLUE SHIELD | Admitting: Hematology and Oncology

## 2017-02-02 ENCOUNTER — Other Ambulatory Visit: Payer: Self-pay | Admitting: Internal Medicine

## 2017-02-02 ENCOUNTER — Telehealth: Payer: Self-pay | Admitting: Internal Medicine

## 2017-02-02 NOTE — Telephone Encounter (Signed)
Rec'd long term disability forms via fax from [place] to [employer] via [employer] -pr

## 2017-02-05 ENCOUNTER — Ambulatory Visit (INDEPENDENT_AMBULATORY_CARE_PROVIDER_SITE_OTHER): Payer: BLUE CROSS/BLUE SHIELD | Admitting: Internal Medicine

## 2017-02-05 ENCOUNTER — Ambulatory Visit: Payer: BLUE CROSS/BLUE SHIELD | Admitting: Hematology and Oncology

## 2017-02-05 ENCOUNTER — Encounter: Payer: Self-pay | Admitting: Internal Medicine

## 2017-02-05 VITALS — BP 142/84 | HR 86 | Temp 98.7°F | Ht 65.0 in | Wt 203.6 lb

## 2017-02-05 DIAGNOSIS — I10 Essential (primary) hypertension: Secondary | ICD-10-CM | POA: Diagnosis not present

## 2017-02-05 DIAGNOSIS — E785 Hyperlipidemia, unspecified: Secondary | ICD-10-CM

## 2017-02-05 DIAGNOSIS — R0981 Nasal congestion: Secondary | ICD-10-CM

## 2017-02-05 DIAGNOSIS — Z20828 Contact with and (suspected) exposure to other viral communicable diseases: Secondary | ICD-10-CM | POA: Diagnosis not present

## 2017-02-05 MED ORDER — OXYMETAZOLINE HCL 0.05 % NA SOLN: 1.0000 | Freq: Two times a day (BID) | NASAL | 0 refills | Status: DC

## 2017-02-05 MED ORDER — DIPHENHYDRAMINE HCL 25 MG PO TABS: 25.0000 mg | ORAL_TABLET | Freq: Four times a day (QID) | ORAL | 0 refills | Status: DC | PRN

## 2017-02-05 NOTE — Assessment & Plan Note (Signed)
LDL elevated in 2016 with elevated ASCVD risk score. Patient not currently on statin or ASA therapy. From chart review was previously on Pravastatin but this was discontinued in 2014 due to cost. Will check lipid panel today and re-calculate ASCVD risk score. Discussed possibility of needing statin/ASA therapy with patient.

## 2017-02-05 NOTE — Progress Notes (Signed)
Subjective:    [name] - 56 y.o. female MRN [id number]  Date of birth: 1961-03-10  HPI  [name] is here for annual exam.  HTN: Does not monitor BPs at home. Reports compliance with medications. No chest pain, LE edema, headaches or vision changes.   Nasal Congestion: Patient reports nasal congestion and clear rhinorrhea for past 3-4 days. Has associated facial pressure. Denies cough or fevers at home. No sick contacts. Has Flonase at home but has not been using daily. Has been taking Tylenol 200-400 mg daily without relief.   Health Maintenance Due  Topic Date Due  . Hepatitis C Screening  1961-10-15    -  reports that she quit smoking about 13 years ago. She has a 20.00 pack-year smoking history. She has never used smokeless tobacco. - Review of Systems: Per HPI. - Past Medical History: Patient Active Problem List   Diagnosis Date Noted  . Nasal congestion 02/05/2017  . Morbid obesity due to excess calories ([place]) 01/10/2017  . Dyspnea on exertion 11/09/2016  . Chronic respiratory failure with hypoxia and hypercapnia ([place]) 10/29/2016  . Malnutrition of moderate degree 10/10/2016  . Acute on chronic respiratory failure ([place]) 10/09/2016  . Difficulty in walking, not elsewhere classified   . COPD exacerbation ([place]) 10/06/2016  . Depression with anxiety 08/07/2016  . Subacromial impingement of left shoulder 07/19/2016  . Overweight (BMI 25.0-29.9) 05/11/2016  . Hot flashes 02/01/2016  . Constipation 01/13/2016  . Breast cancer of upper-outer quadrant of right female breast ([place]) 08/04/2015  . Tibial pain 06/03/2015  . Rotator cuff impingement syndrome 09/14/2014  . Menopausal syndrome (hot flashes) 01/19/2014  . GERD (gastroesophageal reflux disease) 12/21/2012  . Hyperlipidemia 12/21/2012  . SICKLE-CELL TRAIT 04/15/2008  . Sarcoidosis ([place]) 01/10/2007  . ANEMIA, IRON DEFICIENCY, UNSPEC. 01/10/2007  . HYPERTENSION, BENIGN SYSTEMIC 01/10/2007  . COPD  GOLD III  01/10/2007   - Medications: reviewed and updated   Objective:   Physical Exam BP (!) 142/84   Pulse 86   Temp 98.7 F (37.1 C) (Oral)   Ht 5\' 5"  (1.651 m)   Wt 203 lb 9.6 oz (92.4 kg)   SpO2 99% Comment: @2L   BMI 33.88 kg/m  Gen: NAD, alert, cooperative with exam, well-appearing HEENT: NCAT, PERRL, clear conjunctiva, oropharynx clear, TMs normal bilaterally, [place] in place, no maxillary or frontal TTP  CV: RRR, good S1/S2, no murmur, no edema, capillary refill brisk  Resp: CTABL, no wheezes, non-labored Abd: SNTND, BS present, no guarding or organomegaly Skin: no rashes, normal turgor  Neuro: no gross deficits.  Psych: good insight, alert and oriented  Assessment & Plan:   HYPERTENSION, BENIGN SYSTEMIC Essentially at goal today. Will continue with Triamterene-HCTZ and Metoprolol. Check BMET today and lipid panel for risk stratification.   Hyperlipidemia LDL elevated in 2016 with elevated ASCVD risk score. Patient not currently on statin or ASA therapy. From chart review was previously on Pravastatin but this was discontinued in 2014 due to cost. Will check lipid panel today and re-calculate ASCVD risk score. Discussed possibility of needing statin/ASA therapy with patient.   Nasal congestion Suspect related to viral URI. Due to time course, low suspicion for acute bacterial sinusitis warranting antibiotic therapy at present. Lung exam clear and patient satting well on home O2. Recommended Afrin x3 days and Benadryl. Patient to return if symptoms worsen/persist for >1 week after this visit or if she develops fevers or SOB.   Health Maintenance:  -Hep C ordered today  Phill Myron, D.O. 02/05/2017, 4:03 PM PGY-2, [place]

## 2017-02-05 NOTE — Assessment & Plan Note (Signed)
Essentially at goal today. Will continue with Triamterene-HCTZ and Metoprolol. Check BMET today and lipid panel for risk stratification.

## 2017-02-05 NOTE — Patient Instructions (Signed)
I will call you with your lab results.   If you start having fevers, shortness of breath or you facial pressure continues for more than another week please let us know. At this point I think your symptoms are from a virus and do not require an antibiotic. Use the Afrin for three days and continue to use the Flonase. I have also sent Benadryl. This may make you sleepy so do not take if driving.

## 2017-02-05 NOTE — Assessment & Plan Note (Signed)
Suspect related to viral URI. Due to time course, low suspicion for acute bacterial sinusitis warranting antibiotic therapy at present. Lung exam clear and patient satting well on home O2. Recommended Afrin x3 days and Benadryl. Patient to return if symptoms worsen/persist for >1 week after this visit or if she develops fevers or SOB.

## 2017-02-06 ENCOUNTER — Encounter: Payer: Self-pay | Admitting: Hematology and Oncology

## 2017-02-06 ENCOUNTER — Ambulatory Visit (HOSPITAL_BASED_OUTPATIENT_CLINIC_OR_DEPARTMENT_OTHER): Payer: BLUE CROSS/BLUE SHIELD | Admitting: Hematology and Oncology

## 2017-02-06 DIAGNOSIS — R232 Flushing: Secondary | ICD-10-CM

## 2017-02-06 DIAGNOSIS — Z79811 Long term (current) use of aromatase inhibitors: Secondary | ICD-10-CM

## 2017-02-06 DIAGNOSIS — C50411 Malignant neoplasm of upper-outer quadrant of right female breast: Secondary | ICD-10-CM

## 2017-02-06 DIAGNOSIS — Z170 Estrogen receptor positive status [ER+]: Secondary | ICD-10-CM | POA: Diagnosis not present

## 2017-02-06 LAB — COMPREHENSIVE METABOLIC PANEL
ALBUMIN: 4.4 g/dL (ref 3.5–5.5)
ALK PHOS: 88 IU/L (ref 39–117)
ALT: 12 IU/L (ref 0–32)
AST: 16 IU/L (ref 0–40)
Albumin/Globulin Ratio: 1.3 (ref 1.2–2.2)
BILIRUBIN TOTAL: 0.4 mg/dL (ref 0.0–1.2)
BUN / CREAT RATIO: 16 (ref 9–23)
BUN: 13 mg/dL (ref 6–24)
CHLORIDE: 96 mmol/L (ref 96–106)
CO2: 31 mmol/L — ABNORMAL HIGH (ref 18–29)
Calcium: 10.9 mg/dL — ABNORMAL HIGH (ref 8.7–10.2)
Creatinine, Ser: 0.82 mg/dL (ref 0.57–1.00)
GFR calc Af Amer: 92 mL/min/{1.73_m2} (ref >59)
GFR calc non Af Amer: 80 mL/min/{1.73_m2} (ref >59)
GLOBULIN, TOTAL: 3.3 g/dL (ref 1.5–4.5)
GLUCOSE: 92 mg/dL (ref 65–99)
Potassium: 4.5 mmol/L (ref 3.5–5.2)
SODIUM: 145 mmol/L — AB (ref 134–144)
Total Protein: 7.7 g/dL (ref 6.0–8.5)

## 2017-02-06 LAB — HEPATITIS C ANTIBODY (REFLEX)

## 2017-02-06 LAB — LIPID PANEL
CHOLESTEROL TOTAL: 247 mg/dL — AB (ref 100–199)
Chol/HDL Ratio: 3.1 ratio units (ref 0.0–4.4)
HDL: 80 mg/dL (ref >39)
LDL Calculated: 131 mg/dL — ABNORMAL HIGH (ref 0–99)
Triglycerides: 178 mg/dL — ABNORMAL HIGH (ref 0–149)
VLDL CHOLESTEROL CAL: 36 mg/dL (ref 5–40)

## 2017-02-06 LAB — HCV COMMENT:

## 2017-02-06 MED ORDER — GABAPENTIN 300 MG PO CAPS: 300.0000 mg | ORAL_CAPSULE | Freq: Every day | ORAL | 3 refills | Status: DC

## 2017-02-06 NOTE — Assessment & Plan Note (Signed)
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   Hospitalization: 10/06/2016-10/10/2016: COPD exacerbation with acute hypoxic respiratory failure complicated by sarcoidosis  Breast Cancer Surveillance: 1. Breast exam 02/06/2017: Normal 2. Mammogram 08/10/2016: Benign breast density category B  Return to clinic in 1 year for follow-up

## 2017-02-06 NOTE — Progress Notes (Signed)
Patient Care Team: Nicolette Bang, DO as PCP - General (Family Medicine) Nicholas Lose, MD as Consulting Physician (Hematology and Oncology) Eppie Gibson, MD as Attending Physician (Radiation Oncology) Stark Klein, MD as Consulting Physician (General Surgery) Sylvan Cheese, NP as Nurse Practitioner (Hematology and Oncology)  DIAGNOSIS:  Encounter Diagnoses  Name Primary?  . Malignant neoplasm of upper-outer quadrant of right breast in female, estrogen receptor positive ([place])   . Hot flashes     SUMMARY OF ONCOLOGIC HISTORY:   Breast cancer of upper-outer quadrant of right female breast ([place])   07/08/2015 Mammogram    Right breast calcifications 11 mm span      08/04/2015 Initial Diagnosis    Biopsy right breast: Invasive ductal carcinoma with DCIS with calcifications, lymphovascular invasion is identified, grade 2-3, ER 100%, PR 100% Ki-67 20%, HER-2 negative ratio 1.46      08/04/2015 Clinical Stage    Stage IA: T1b N0      08/18/2015 Definitive Surgery    Right  lumpectomy: invasive ductal carcinoma, grade 2, 0.6 cm, with DCIS high-grade, margins negative, 0/2 lymph nodes negative, ER 100%, PR positive, HER-2 negative, Ki-67 20%      08/18/2015 Pathologic Stage    Stage IA: T1b N0      08/18/2015 Oncotype testing    Insufficient tissue to perform      09/21/2015 - 10/20/2015 Radiation Therapy    Adjuvant radiation therapy: 1) Right Breast / 40.05 Gy in 15 fractions 2) Right breast boost / 10 Gy in 5 fractions      10/08/2015 - 10/11/2015 Hospital Admission    cellulitis of the right breast      11/16/2015 -  Anti-estrogen oral therapy    Anastrozole 1 mg daily once hot flashes better controlled. Planned duration of therapy 5 years.        CHIEF COMPLIANT:  Follow-up on anastrozole therapy, complaining of severe hot flashes  INTERVAL HISTORY: [name] is a  56 year old with above-mentioned history of right breast cancer treated with  lumpectomy and adjuvant radiation therapy. She is currently on anastrozole since January 2017. She continues to have hot flashes. Recently she was in the hospital in November 2017 with COPD exacerbation respiratory failure. She was started on steroid therapy and she is currently on prednisone. She was also noted to have sarcoidosis. Because of the steroids she thinks the hot flashes have gotten worse. Her last mammograms were in September 2017 which were normal. She denies any lumps or nodules in the breast.  REVIEW OF SYSTEMS:   Constitutional: Denies fevers, chills or abnormal weight loss Eyes: Denies blurriness of vision Ears, nose, mouth, throat, and face: Denies mucositis or sore throat Respiratory: Shortness of breath Cardiovascular: Denies palpitation, chest discomfort Gastrointestinal:  Denies nausea, heartburn or change in bowel habits Skin: Denies abnormal skin rashes Lymphatics: Denies new lymphadenopathy or easy bruising Neurological:Denies numbness, tingling or new weaknesses Behavioral/Psych: Mood is stable, no new changes  Extremities: No lower extremity edema Breast:  denies any pain or lumps or nodules in either breasts All other systems were reviewed with the patient and are negative.  I have reviewed the past medical history, past surgical history, social history and family history with the patient and they are unchanged from previous note.  ALLERGIES:  is allergic to codeine.  MEDICATIONS:  Current Outpatient Prescriptions  Medication Sig Dispense Refill  . acetaminophen (TYLENOL) 500 MG tablet Take 1,000 mg by mouth every 6 (six) hours as needed  for fever. Reported on 05/10/2016    . albuterol (PROVENTIL HFA;VENTOLIN HFA) 108 (90 Base) MCG/ACT inhaler Inhale 2 puffs into the lungs every 6 (six) hours as needed for wheezing or shortness of breath. 1 Inhaler 5  . anastrozole (ARIMIDEX) 1 MG tablet Take 1 tablet (1 mg total) by mouth at bedtime. 90 tablet 3  . CALCIUM PO  Take 1 tablet by mouth daily.    . diphenhydrAMINE (BENADRYL) 25 MG tablet Take 1 tablet (25 mg total) by mouth every 6 (six) hours as needed. 30 tablet 0  . fluticasone (FLONASE) 50 MCG/ACT nasal spray Place 2 sprays into both nostrils daily as needed for allergies. 16 g 11  . gabapentin (NEURONTIN) 300 MG capsule Take 1 capsule (300 mg total) by mouth at bedtime. 90 capsule 3  . Glycopyrrolate-Formoterol (BEVESPI AEROSPHERE) 9-4.8 MCG/ACT AERO Inhale 2 puffs into the lungs 2 (two) times daily. 2 Inhaler 0  . metoprolol tartrate (LOPRESSOR) 25 MG tablet Take 1 tablet (25 mg total) by mouth 2 (two) times daily. 60 tablet 11  . Multiple Vitamin (MULTIVITAMIN) capsule Take 1 capsule by mouth daily.    Marland Kitchen omeprazole (PRILOSEC) 20 MG capsule Take 1 capsule (20 mg total) by mouth daily. 30 capsule 5  . oxymetazoline (AFRIN NASAL SPRAY) 0.05 % nasal spray Place 1 spray into both nostrils 2 (two) times daily. 30 mL 0  . predniSONE (DELTASONE) 10 MG tablet Take 10 mg by mouth daily with breakfast.    . sertraline (ZOLOFT) 50 MG tablet Take 1 tablet (50 mg total) by mouth daily. (Patient taking differently: Take 50 mg by mouth at bedtime. ) 30 tablet 3  . triamterene-hydrochlorothiazide (MAXZIDE-25) 37.5-25 MG tablet TAKE ONE TABLET BY MOUTH ONCE DAILY 30 tablet 11   No current facility-administered medications for this visit.     PHYSICAL EXAMINATION: ECOG PERFORMANCE STATUS: 2 - Symptomatic, <50% confined to bed  Vitals:   02/06/17 0921  BP: 123/72  Pulse: 79  Resp: 18  Temp: 98.1 F (36.7 C)   Filed Weights   02/06/17 0921  Weight: 202 lb 1.6 oz (91.7 kg)    GENERAL:alert, no distress and comfortable SKIN: skin color, texture, turgor are normal, no rashes or significant lesions EYES: normal, Conjunctiva are pink and non-injected, sclera clear OROPHARYNX:no exudate, no erythema and lips, buccal mucosa, and tongue normal  NECK: supple, thyroid normal size, non-tender, without  nodularity LYMPH:  no palpable lymphadenopathy in the cervical, axillary or inguinal LUNGS:  Shortness of breath to minimal exertion, uses nasal cannula oxygen HEART: regular rate & rhythm and no murmurs and no lower extremity edema ABDOMEN:abdomen soft, non-tender and normal bowel sounds MUSCULOSKELETAL:no cyanosis of digits and no clubbing  NEURO: alert & oriented x 3 with fluent speech, no focal motor/sensory deficits EXTREMITIES: No lower extremity edema BREAST: No palpable masses or nodules in either right or left breasts. No palpable axillary supraclavicular or infraclavicular adenopathy no breast tenderness or nipple discharge. (exam performed in the presence of a chaperone)  LABORATORY DATA:  I have reviewed the data as listed   Chemistry      Component Value Date/Time   NA 145 (H) 02/05/2017 1530   K 4.5 02/05/2017 1530   CL 96 02/05/2017 1530   CO2 31 (H) 02/05/2017 1530   BUN 13 02/05/2017 1530   CREATININE 0.82 02/05/2017 1530   CREATININE 0.64 02/04/2015 1619      Component Value Date/Time   CALCIUM 10.9 (H) 02/05/2017 1530   ALKPHOS  88 02/05/2017 1530   AST 16 02/05/2017 1530   ALT 12 02/05/2017 1530   BILITOT 0.4 02/05/2017 1530       Lab Results  Component Value Date   WBC 12.8 (H) 11/09/2016   HGB 11.9 (L) 11/09/2016   HCT 35.8 (L) 11/09/2016   MCV 78.8 11/09/2016   PLT 362.0 11/09/2016   NEUTROABS 11.2 (H) 11/09/2016    ASSESSMENT & PLAN:  Breast cancer of upper-outer quadrant of right female breast ([place]) 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    Hospitalization: 10/06/2016-10/10/2016: COPD exacerbation with acute hypoxic respiratory failure complicated by sarcoidosis Patient uses oxygen by nasal cannula since the hospitalization.  Breast Cancer Surveillance: 1. Breast exam 02/06/2017: Normal 2. Mammogram 08/10/2016: Benign breast density category B  patient informed me that she is losing her insurance and has applied for Medicaid. She was interested in talking with our financial counselors.  Return to clinic in 1 year for follow-up  I spent 25 minutes talking to the patient of which more than half was spent in counseling and coordination of care.  No orders of the defined types were placed in this encounter.  The patient has a good understanding of the overall plan. she agrees with it. she will call with any problems that may develop before the next visit here.   Rulon Eisenmenger, MD 02/06/17

## 2017-02-07 DIAGNOSIS — J449 Chronic obstructive pulmonary disease, unspecified: Secondary | ICD-10-CM | POA: Diagnosis not present

## 2017-02-14 ENCOUNTER — Telehealth: Payer: Self-pay | Admitting: Internal Medicine

## 2017-02-14 MED ORDER — ATORVASTATIN CALCIUM 20 MG PO TABS: 20.0000 mg | ORAL_TABLET | Freq: Every day | ORAL | 3 refills | Status: DC

## 2017-02-14 NOTE — Telephone Encounter (Signed)
Called to discus lab results with patient.   ASCVD risk score calculated to be 6.0%. Patient agreeable to moderate intensity statin. Will prescribe Atorvastatin 20 mg daily.   Ca noted to be elevated to 10.9. Has previously been WNL. Albumin WNL on lab check. No past medical history that would affect Ca levels. May be related to HCTZ use. Patient reports taking Ca supplement. Advised her to d/c this supplement. PTH has never been checked.   Future order placed for repeat Ca level and to check PTH. Patient to make lab appointment for later this month.   Phill Myron, D.O. 02/14/2017, 11:40 AM PGY-2, [place]

## 2017-02-15 ENCOUNTER — Telehealth: Payer: Self-pay | Admitting: Internal Medicine

## 2017-02-15 NOTE — Telephone Encounter (Signed)
Placed in [place] to review and complete when he comes back in

## 2017-02-20 ENCOUNTER — Ambulatory Visit (INDEPENDENT_AMBULATORY_CARE_PROVIDER_SITE_OTHER): Payer: BLUE CROSS/BLUE SHIELD | Admitting: Internal Medicine

## 2017-02-20 ENCOUNTER — Encounter: Payer: Self-pay | Admitting: Internal Medicine

## 2017-02-20 VITALS — BP 130/64 | HR 77 | Ht 65.0 in | Wt 206.0 lb

## 2017-02-20 DIAGNOSIS — J449 Chronic obstructive pulmonary disease, unspecified: Secondary | ICD-10-CM | POA: Diagnosis not present

## 2017-02-20 DIAGNOSIS — J9612 Chronic respiratory failure with hypercapnia: Secondary | ICD-10-CM

## 2017-02-20 DIAGNOSIS — J9611 Chronic respiratory failure with hypoxia: Secondary | ICD-10-CM

## 2017-02-20 DIAGNOSIS — D869 Sarcoidosis, unspecified: Secondary | ICD-10-CM | POA: Diagnosis not present

## 2017-02-20 MED ORDER — GLYCOPYRROLATE-FORMOTEROL 9-4.8 MCG/ACT IN AERO: 2.0000 | INHALATION_SPRAY | Freq: Two times a day (BID) | RESPIRATORY_TRACT | 0 refills | Status: DC

## 2017-02-20 NOTE — Assessment & Plan Note (Signed)
Positive transbronchial biopsy 02/19/91 by Dr. Unice Cobble  -Daily prednisone since 2007 with ? adrenal insufficiency iatrogenic > off completely Feb 2012 with no recurrence - PFT's 06/26/08 FEV1  1.09 ratio  - PFTs  03/02/10 FEV1  1.06 (42%) ratio 34 and no better p B2,  DLC0 48% corrects to 64% - PFT's 08/01/2011 FEV1 1.6 (42%)  41 ratio  DLCO 55 corrects 77 - PFTs 10/10/16    FEV1  0.87(38)ratio 41 and DLCO 36/39c corrects to 51 for alv vol > placed back on daily pred  - 02/20/2017 decreased pred to 5 mg daily   The goal with a chronic steroid dependent illness is always arriving at the lowest effective dose that controls the disease/symptoms and not accepting a set "formula" which is based on statistics or guidelines that don't always take into account patient  variability or the natural hx of the dz in every individual patient, which may well vary over time.  For now therefore I recommend the patient maintain  A floor of 5 mg daily

## 2017-02-20 NOTE — Progress Notes (Signed)
Subjective:    Patient ID: [name], female   DOB: 09-05-1961    MRN: [id number]    Brief patient profile:  56 yobf quit smoking 10/2004 dx by transbronchial biopsy 5/92 with NCG inflammatory change consistent with sarcoid. Since that time she's been off and on prednisone multiple  times with flares of coughing dyspnea and skin involvement > weaned off chronic prednisone Feb 2012.   Overall she's been doing much better since she stopped smoking 10/2004 and much better since change to Symbicort 160/4.5 2 two times a day, able to do adl's ok without limitations including some steps. Documented GOLD III copd  07/2011   Placed back on daily steroids for new resp failure 09/2016     History of Present Illness 04/23/2014 f/u ov/Wert re: cough since late May 2015  Chief Complaint  Patient presents with  . Follow-up    Pt reports cough only minimally improved since eval here on 04/13/14. Sometimes coughs until develops HA and vomiting.    Worse at hs and in am ,states already taking omeprazole 20 mg bid ac (though very hesitant with answers re meds and does not have med calendar with action plan as rec previously  rx doxy/ pred and "no better" but no purulent sputum at all now/ actually very little mucus production unless vomits from coughng Never took hycodan Not limited by breathing from desired activities   rec Go ahead and take your cough medication to completely suppress your cough Prednisone 10 mg take  4 each am x 2 days,   2 each am x 2 days,  1 each am x 2 days and stop  Add pepcid ac 20 mg at bedtime and chlortrimeton 4 mg (over the counter) x 2 at bedtime until cough is 100% gone x 2 weeks GERD  Please see patient coordinator before you leave today  to schedule sinus CT> not done   I f not better return in 4 weeks with all meds and med calendar   08/21/2014 f/u ov/Wert re:  GOLD III copd / no med calendar  Chief Complaint  Patient presents with  . Follow-up    Pt states  having increased DOE just since this am. She was SOB walking from her car to our building today.     did not use saba on day of ov/ saba was clogged up  > rec Pulm rehab> never rehab      05/11/2016  f/u ov/Wert re: GOLD III / symb 160 2bid  Chief Complaint  Patient presents with  . Follow-up    Breathing is overall doing well and she rarely uses rescue inhaler. Needs refills on Symbicort.   working out at the gym but not much aerobics   No tendency to aecopd  rec No change rx/ no need for prednisone   2 weeks prior to Tgiving 2017  progressive doe indolent onset some am cough    Admit date: 10/06/2016 Discharge date: 10/10/2016  Admitted From: Home Disposition:  Home  Recommendations for Outpatient Follow-up:  1. Follow up with PCP in 1-2 weeks 2. Follow up with Pulmonary as scheduled  Equipment/Devices:Home O2    Discharge Condition:Stable CODE STATUS:Full Diet recommendation: Heart healthy   Brief/Interim Summary: 56 y.o.woman with a history of COPD, pulmonary sarcoidosis, HTN, breast cancer, and depression who presents to the ED for evaluation of progressive shortness of breath at rest and dyspnea on exertion. Symptoms have been progressive over the past 1-2 weeks. She has had  intermittent cough with thick white sputum production (primarily in the mornings). She denies fevers, chills, or sweats. No light-headedness or syncope. No swelling. She has not taken anything over the counter or received any prescriptions as an outpatient for these symptoms. She uses her Symbicort inhaler twice daily and she has an albuterol rescue inhaler at baseline  COPD exacerbation with acute hypoxic respiratory failure complicated by known history of sarcoidosis with mildly progressive changes on CT scan of her chest. -Patient reports overall improvement since admission -Lungs remain clear without audible wheezing -patient remains stable, however continues to desaturate on  minimal exertion -have consulted pulmonary for assistance -Patient found to have neg respiratory viral culture, unremarkable 2d echo -Pulmonary recommended trial of lasix x 1 dose with good urine output, however patient remains supplemental O2 dependent -Pulmonary recommends 2-3 week prednisone taper -See nursing documentation, Patient requires continuous home O2  Atypical chest discomfort in the setting of hypoxia -Remains chest pain-free -This admission, serial troponins were negative 3 -Presenting EKG unremarkable  -Likely chest wall pain secondary to COPD  HTN -Blood pressure remained stable -Patient continued on metoprolol and hydrochlorothiazide  GERD -Remains stable currently -Continue pantoprazole  History of breast cancer --Continue Arimidex Currently stable  Depression --Continue Zoloft Appear stable at present  Discharge Diagnoses:    COPD exacerbation ([place])    Sarcoidosis ([place])   HYPERTENSION, BENIGN SYSTEMIC   Depression with anxiety   Difficulty in walking, not elsewhere classified   Acute on chronic respiratory failure ([place])   Malnutrition of moderate degree    10/27/2016 extended post hosp f/u ov/Wert/ transition of care  re: Sarcoid/  GOLD III copd/ symb 160 2bid  Chief Complaint  Patient presents with  . Follow-up    little troble breathing when decreased prednisone down to 10mg , hard to maneuver around the house even on oxygen at 2L  feels better on 02 and d/c on pred 60 > 20 and fine until reduced to 10 x 4 days then worse sob  Prior to reducing the reducing the dose of prednisone able to walk around home s 02 and now desat 70s on 02  Different from sarcoid because with sarcoid had assoc cough / similar to sarcoid in terms of appetite, worse off it/ better on it  rec Resume prednisone 20 mg daily until next office visit Late add: If she is going to continue to need systemic steroids, a good option for her might be lama/laba s the the ics  eg BEVESPI, will  Re-consider at next ov    11/09/2016  f/u ov/Wert re:   Sarcoid / GOLD III pred still on 20 mg daily  Chief Complaint  Patient presents with  . Follow-up    Breathing is unchanged. She noticed some wheezing a day ago. She is using albuterol inhaler 2 x per wk on average.    doe = MMRC3 = can't walk 100 yards even at a slow pace at a flat grade s stopping due to sob  Even on 02  rec We will contact advanced to get your oxygen humidified  Stop symbicort and try bevespi Take 2 puffs first thing in am and then another 2 puffs about 12 hours later.  Only use your albuterol as a rescue medication    01/09/2017  f/u ov/Wert re:  GOLD III/ pred 10 and bevespi 2 bid / alb rare Chief Complaint  Patient presents with  . Follow-up    Pt states breathing is "a little rough for me  today".  She is concerned about wt gain.   doe still = MMRC3  No better p saba rec Add pepcid 20 mg at bedtime Add 02 2lpm at bedtime and continue to adjust daytime to saturations greater than 90%  See calendar for specific medication instructions    02/20/2017  f/u ov/Wert re:   GOLD III/ pred 10 mg daily and bevespi 2bid  and alb rare  Chief Complaint  Patient presents with  . Follow-up    6wk rov. pt states breathing is baseline. pt reports of sob with exertion & occ prod cough with green mucus mainly in the morning      Doe better :  [facility] = can't walk a nl pace on a flat grade s sob but does fine slow and flat eg shopping on  2lpm / ok at rest s 02  Uses 02 2lpm sleeping    No obvious day to day or daytime variability or assoc excess/ purulent sputum or mucus plugs or hemoptysis or cp or chest tightness, subjective wheeze or overt sinus or hb symptoms. No unusual exp hx or h/o childhood pna/ asthma or knowledge of premature birth.  Sleeping ok without nocturnal  or early am exacerbation  of respiratory  c/o's or need for noct saba. Also denies any obvious fluctuation of symptoms with weather  or environmental changes or other aggravating or alleviating factors except as outlined above   Current Medications, Allergies, Complete Past Medical History, Past Surgical History, Family History, and Social History were reviewed in [employer] record.  ROS  The following are not active complaints unless bolded sore throat, dysphagia, dental problems, itching, sneezing,  nasal congestion improved with afrin Asencion [employer] per action plan  or excess/ purulent secretions, ear ache,   fever, chills, sweats, unintended wt loss, classically pleuritic or exertional cp,  orthopnea pnd or leg swelling, presyncope, palpitations, abdominal pain, anorexia, nausea, vomiting, diarrhea  or change in bowel or bladder habits, change in stools or urine, dysuria,hematuria,  rash, arthralgias, visual complaints, headache, numbness, weakness or ataxia or problems with walking or coordination,  change in mood/affect or memory.                        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 Unice Cobble  -h/o Daily prednisone since 2007   > off completely Feb 2012 with no problem ? 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 (ICD-280.9)  Health Maintenance.......................................................   [facility]   Family History:   crohn`s in daughter, M-lupus, htn, asthma,  no Ca, DM, CAD    Social History:   lives with twin children and grandson. single; >20 pack year history, quit in 2005; no EtOH  Laid off  from works for [employer] Sept 2013  Objective:   Physical Exam wt 166 Oct 12, 2009 >170 October 28, 2010 > 169 06/29/2011 >>171 08/24/2011 > 01/15/2012  171 > 04/24/2012 176 > 06/28/2012  175> 08/13/2012 > 08/30/2012  173 > 170 11/28/2012 > 01/29/2013  176 > 174 >173 06/16/2013 > 171 09/18/2013 > 12/19/2013 177 >179 04/13/2014>  04/23/2014 177 > 08/21/2014 184 >182 11/27/2014 > 05/12/2015 177  > 05/11/2016   177 > 10/27/2016  175 > 11/09/2016 182 > 01/09/2017 198 > 02/20/2017  206   amb bf nad / vital signs reviewed   - Note on arrival 02 sats  95% on 2lpm POC pulsed   HEENT: nl  oropharynx. Nl external ear canals without cough reflex - edentulous / moderate bilateral non-specific turbinate edema     NECK :  without JVD/Nodes/TM/ nl carotid upstrokes bilaterally   LUNGS: no acc muscle use, lungs with distant bs bilaterally with  min exp rhonchi    CV:  RRR  no s3 or murmur or increase in P2, no edema   ABD:  soft and nontender with nl excursion in the supine position. No bruits or organomegaly, bowel sounds nl  MS:  warm without deformities, calf tenderness, cyanosis or clubbing  SKIN: warm and dry without lesions    NEURO:  alert, approp, no deficits         I personally reviewed images and agree with radiology impression as follows:  CXR:    11/09/16 Stable bibasilar scarring. No significant change compared to prior exam.     . Assessment:

## 2017-02-20 NOTE — Assessment & Plan Note (Signed)
Body mass index is 34.28 kg/m.  trending up on prednisone Lab Results  Component Value Date   TSH 0.66 11/09/2016     Contributing to gerd risk/ doe/reviewed the need and the process to achieve and maintain neg calorie balance > defer f/u primary care including intermittently monitoring thyroid status

## 2017-02-20 NOTE — Telephone Encounter (Signed)
LR please advise if this has been completed so this phone note may be signed.

## 2017-02-20 NOTE — Patient Instructions (Signed)
Drop prednisone to 10 mg one half daily   See calendar for specific medication instructions and bring it back for each and every office visit for every healthcare provider you see.  Without it,  you may not receive the best quality medical care that we feel you deserve.  You will note that the calendar groups together  your maintenance  medications that are timed at particular times of the day.  Think of this as your checklist for what your doctor has instructed you to do until your next evaluation to see what benefit  there is  to staying on a consistent group of medications intended to keep you well.  The other group at the bottom is entirely up to you to use as you see fit  for specific symptoms that may arise between visits that require you to treat them on an as needed basis.  Think of this as your action plan or "what if" list.   Separating the top medications from the bottom group is fundamental to providing you adequate care going forward.    Please schedule a follow up visit in 3 months but call sooner if needed

## 2017-02-20 NOTE — Assessment & Plan Note (Signed)
New start 09/2016 at admit see records - 10/18/16 Patient Saturations on Room Air at Rest = 92% Patient Saturations on Room Air while Ambulating = 84% Patient Saturations on 2 Liters of oxygen while Ambulating = 90% - HCO3  11/09/2016 = 39   As of  02/20/2017 rec 2lpm sleeping and titrate with ambulation to maintain > 90%

## 2017-02-20 NOTE — Assessment & Plan Note (Signed)
Quit smoking 2005  - PFT's 06/26/08 FEV1 1.09 ratio  - PFTs 03/02/10 FEV1 1.06 ratio 34    PFT's 08/01/2011 FEV1 1.6 (42%)  41 ratio  DLCO 55 corrects 77 - Spiriva trial March 02, 2010 > ? better but not worse off it 04/2011 - 05/12/2015 p extensive coaching HFA effectiveness =    90%  - Referred to Rehab 08/21/2014 > could not arrange due to schedule  -med calendar 06/16/2013 > did not bring to office as requested 12/19/13 or 04/23/14  Or 08/21/2014  - 11/09/2016    changed symb to bevespi since taking chronic pred for pf   - 02/20/2017  After extensive coaching HFA effectiveness =    90%  - 02/20/2017 decreased pred to 5 mg daily    Group D in terms of symptom/risk and laba/lama/ICS  therefore appropriate rx at this point but since she's on pred for pf related to sarcoid then should do just as well just on lama/laba and if flares as pred is tapered will change to symb/lama   I had an extended discussion with the patient reviewing all relevant studies completed to date and  lasting 15 to 20 minutes of a 25 minute visit    Each maintenance medication was reviewed in detail including most importantly the difference between maintenance and prns and under what circumstances the prns are to be triggered using an action plan format that is not reflected in the computer generated alphabetically organized AVS but trather by a customized med calendar that reflects the AVS meds with confirmed 100% correlation.   In addition, Please see AVS for unique instructions that I personally wrote and verbalized to the the pt in detail and then reviewed with pt  by my nurse highlighting any  changes in therapy recommended at today's visit to their plan of care.

## 2017-02-20 NOTE — Telephone Encounter (Signed)
Pt here for OV now and MW completing forms  Will give to [place] once done

## 2017-02-20 NOTE — Telephone Encounter (Signed)
Rec'd completed forms - fwd to [place] via interoffice mail - 02/20/17 -pr

## 2017-03-01 ENCOUNTER — Other Ambulatory Visit: Payer: BLUE CROSS/BLUE SHIELD

## 2017-03-06 ENCOUNTER — Other Ambulatory Visit: Payer: BLUE CROSS/BLUE SHIELD

## 2017-03-07 LAB — CALCIUM: Calcium: 9.8 mg/dL (ref 8.7–10.2)

## 2017-03-07 LAB — PARATHYROID HORMONE, INTACT (NO CA): PTH: 31 pg/mL (ref 15–65)

## 2017-03-10 DIAGNOSIS — J449 Chronic obstructive pulmonary disease, unspecified: Secondary | ICD-10-CM | POA: Diagnosis not present

## 2017-03-12 ENCOUNTER — Telehealth: Payer: Self-pay | Admitting: *Deleted

## 2017-03-12 ENCOUNTER — Telehealth: Payer: Self-pay | Admitting: Internal Medicine

## 2017-03-12 MED ORDER — ALBUTEROL SULFATE HFA 108 (90 BASE) MCG/ACT IN AERS: 2.0000 | INHALATION_SPRAY | Freq: Four times a day (QID) | RESPIRATORY_TRACT | 2 refills | Status: DC | PRN

## 2017-03-12 NOTE — Telephone Encounter (Signed)
Left detailed message for patient to make aware that new albuterol inhaler is ready for pick up at [facility]. Advised that if she has any further questions/concerns to give our office a call. Nothing further needed.

## 2017-03-12 NOTE — Telephone Encounter (Signed)
-----   Message from Nicolette Bang, DO sent at 03/12/2017 10:49 AM EDT ----- Please call patient and let her know Calcium and PTH were normal.   Phill Myron, D.O. 03/12/2017, 10:41 AM PGY-2, [place]

## 2017-03-12 NOTE — Telephone Encounter (Signed)
Pt states that her Proventil is no longer covered by her insurance -- needs alternative. Pt states that her benefits are being cut off at [place] and she needs to taken care of today. Alternative Ventolin phoned into [facility] - verified with pharmacist that the alternative is covered.   Pt states that she is not going to be able to afford her Bevespi since she is going to be getting a new insurance>>states that it is not covered on the new policy. Pt advised to contact our office once she has all of her new insurance information and we will figure out alternatives at that time.

## 2017-03-12 NOTE — Telephone Encounter (Signed)
Informed pt of the below message and she asked was she still supposed to take her cholesterol medicine.  Told her I would send message to PCP to find out. Katharina Caper, Katleen Carraway D, [place]

## 2017-03-13 ENCOUNTER — Ambulatory Visit (INDEPENDENT_AMBULATORY_CARE_PROVIDER_SITE_OTHER): Payer: Self-pay | Admitting: Obstetrics and Gynecology

## 2017-03-13 ENCOUNTER — Encounter: Payer: Self-pay | Admitting: Obstetrics and Gynecology

## 2017-03-13 VITALS — BP 110/62 | HR 80 | Temp 98.2°F | Wt 208.0 lb

## 2017-03-13 DIAGNOSIS — R350 Frequency of micturition: Secondary | ICD-10-CM | POA: Diagnosis not present

## 2017-03-13 DIAGNOSIS — N3000 Acute cystitis without hematuria: Secondary | ICD-10-CM

## 2017-03-13 LAB — POCT UA - MICROALBUMIN
CREATININE, POC: 200 mg/dL
Microalbumin Ur, POC: 10 mg/L

## 2017-03-13 MED ORDER — SULFAMETHOXAZOLE-TRIMETHOPRIM 800-160 MG PO TABS: 1.0000 | ORAL_TABLET | Freq: Two times a day (BID) | ORAL | 0 refills | Status: AC

## 2017-03-13 NOTE — Progress Notes (Signed)
   Subjective:   Patient ID: [name], female    DOB: 1961-04-29, 56 y.o.   MRN: [id number]  Patient presents for Same Day Appointment  Chief Complaint  Patient presents with  . Urinary Frequency  . Dysuria    HPI: # DYSURIA Pain urinating started this AM. Medications tried: took AZO this morning Any antibiotics in the last 30 days: no More than 3 UTIs in the last 12 months: no STD exposure: no Takes a fluid pill but this frequency is different   Symptoms Urgency: yes Frequency: yes Blood in urine: no Pain in back: no Fever: no Vaginal discharge: no  Review of Systems   See HPI for ROS.   History  Smoking Status  . Former Smoker  . Packs/day: 1.00  . Years: 20.00  . Quit date: 11/14/2003  Smokeless Tobacco  . Never Used    Objective:  BP 110/62   Pulse 80   Temp 98.2 F (36.8 C) (Oral)   Wt 208 lb (94.3 kg)   SpO2 98% Comment: 2L O2  BMI 34.61 kg/m  Vitals and nursing note reviewed  Physical Exam  Constitutional: She is well-developed, well-nourished, and in no distress.  Cardiovascular: Normal rate.   Pulmonary/Chest: Effort normal.  Abdominal: Soft. Normal appearance. There is tenderness in the suprapubic area. There is no guarding.    Assessment & Plan:  1. Acute cystitis without hematuria Exam consistent with a UTI. Unable to do urine dipstick due to orange urine from AZO treatment. Order placed to send urine off for culture and to look at under microscope. Will go ahead and empirically treat with Bactrim DS. Follow urine culture for speciation and susceptibility.   Diagnosis and plan along with any newly prescribed medication(s) were discussed in detail with this patient today. The patient verbalized understanding and agreed with the plan.   PATIENT EDUCATION PROVIDED: See AVS   Luiz Blare, DO 03/13/2017, 4:02 PM PGY-3, [place]

## 2017-03-13 NOTE — Patient Instructions (Signed)

## 2017-03-15 LAB — URINE CULTURE

## 2017-03-16 ENCOUNTER — Encounter (HOSPITAL_COMMUNITY): Payer: Self-pay | Admitting: Nurse Practitioner

## 2017-03-16 ENCOUNTER — Emergency Department (HOSPITAL_COMMUNITY)
Admission: EM | Admit: 2017-03-16 | Discharge: 2017-03-16 | Disposition: A | Discharge status: Home / Self Care | Payer: BLUE CROSS/BLUE SHIELD | Attending: Emergency Medicine | Admitting: Emergency Medicine

## 2017-03-16 ENCOUNTER — Emergency Department (HOSPITAL_COMMUNITY): Payer: BLUE CROSS/BLUE SHIELD

## 2017-03-16 ENCOUNTER — Other Ambulatory Visit: Payer: Self-pay

## 2017-03-16 DIAGNOSIS — Z87891 Personal history of nicotine dependence: Secondary | ICD-10-CM | POA: Diagnosis not present

## 2017-03-16 DIAGNOSIS — I10 Essential (primary) hypertension: Secondary | ICD-10-CM | POA: Insufficient documentation

## 2017-03-16 DIAGNOSIS — J441 Chronic obstructive pulmonary disease with (acute) exacerbation: Secondary | ICD-10-CM

## 2017-03-16 DIAGNOSIS — Z79899 Other long term (current) drug therapy: Secondary | ICD-10-CM | POA: Diagnosis not present

## 2017-03-16 DIAGNOSIS — R0902 Hypoxemia: Secondary | ICD-10-CM | POA: Diagnosis not present

## 2017-03-16 DIAGNOSIS — R0602 Shortness of breath: Secondary | ICD-10-CM | POA: Diagnosis not present

## 2017-03-16 DIAGNOSIS — Z853 Personal history of malignant neoplasm of breast: Secondary | ICD-10-CM | POA: Diagnosis not present

## 2017-03-16 LAB — BASIC METABOLIC PANEL
Anion gap: 6 (ref 5–15)
BUN: 13 mg/dL (ref 6–20)
CO2: 34 mmol/L — ABNORMAL HIGH (ref 22–32)
Calcium: 9.5 mg/dL (ref 8.9–10.3)
Chloride: 102 mmol/L (ref 101–111)
Creatinine, Ser: 0.94 mg/dL (ref 0.44–1.00)
GFR calc Af Amer: >60 mL/min (ref >60)
GLUCOSE: 117 mg/dL — AB (ref 65–99)
Potassium: 4.1 mmol/L (ref 3.5–5.1)
SODIUM: 142 mmol/L (ref 135–145)

## 2017-03-16 LAB — CBC WITH DIFFERENTIAL/PLATELET
BASOS ABS: 0 10*3/uL (ref 0.0–0.1)
Basophils Relative: 0 %
EOS ABS: 0.2 10*3/uL (ref 0.0–0.7)
EOS PCT: 2 %
HCT: 37.2 % (ref 36.0–46.0)
Hemoglobin: 11.6 g/dL — ABNORMAL LOW (ref 12.0–15.0)
LYMPHS PCT: 13 %
Lymphs Abs: 1 10*3/uL (ref 0.7–4.0)
MCH: 26.7 pg (ref 26.0–34.0)
MCHC: 31.2 g/dL (ref 30.0–36.0)
MCV: 85.5 fL (ref 78.0–100.0)
MONO ABS: 0.5 10*3/uL (ref 0.1–1.0)
Monocytes Relative: 7 %
Neutro Abs: 5.8 10*3/uL (ref 1.7–7.7)
Neutrophils Relative %: 78 %
PLATELETS: 294 10*3/uL (ref 150–400)
RBC: 4.35 MIL/uL (ref 3.87–5.11)
RDW: 14.8 % (ref 11.5–15.5)
WBC: 7.5 10*3/uL (ref 4.0–10.5)

## 2017-03-16 MED ORDER — ALBUTEROL SULFATE (2.5 MG/3ML) 0.083% IN NEBU
2.5000 mg | INHALATION_SOLUTION | Freq: Once | RESPIRATORY_TRACT | Status: AC
  Administered 2017-03-16: 2.5 mg via RESPIRATORY_TRACT
  Filled 2017-03-16: qty 3

## 2017-03-16 MED ORDER — ALBUTEROL SULFATE (2.5 MG/3ML) 0.083% IN NEBU: 5.0000 mg | INHALATION_SOLUTION | Freq: Once | RESPIRATORY_TRACT | Status: DC

## 2017-03-16 MED ORDER — DOXYCYCLINE HYCLATE 100 MG PO CAPS: 100.0000 mg | ORAL_CAPSULE | Freq: Two times a day (BID) | ORAL | 0 refills | Status: DC

## 2017-03-16 NOTE — ED Notes (Signed)
Bed: WA10 Expected date:  Expected time:  Means of arrival:  Comments: EMS-COPD 

## 2017-03-16 NOTE — ED Notes (Signed)
RN obtaining labs

## 2017-03-16 NOTE — Discharge Instructions (Signed)
Prednisone at home: 40mg  every day for 3 days, 20mg  every day for 3 days, 10 mg every day for 3 days, then resume your normal 5mg  per day.  Oxygen at 2 liters at rest. 4 Liters when walking

## 2017-03-16 NOTE — ED Triage Notes (Signed)
Pt is brought in by EMS who report pt called with c/o shortness of breath with a known hx of COPD O2 dependent at home 2Litres, but was most concerned with feeling lightheaded. Denies any pain, exhibiting mild labored breathing.

## 2017-03-16 NOTE — ED Notes (Signed)
Pt oxygen saturation dropped to 75% when ambulating on 2 L/m O2.

## 2017-03-17 NOTE — ED Provider Notes (Signed)
[place] DEPT Provider Note   CSN: [id number] Arrival date & time: 03/16/17  1537     History   Chief Complaint Chief Complaint  Patient presents with  . Shortness of Breath  . Dizziness    HPI [name] is a 56 y.o. female. Chief complaint:  dizzy HPI: Patient with a history of COPD. She's had a worsening cough over the last few days. Today when she got up to walk she felt dizzy. Denies vertigo. Felt lightheaded. She checks her pulse ox at home. Normally it is "above 90" on Her 2 liters. She is on 2 L at rest, and with ambulation.  Past Medical History:  Diagnosis Date  . Allergic rhinitis   . COPD, mild ([place]) FOLLOWED BY DR Melvyn Novas  . GERD (gastroesophageal reflux disease)   . HTN (hypertension)   . Hypertension   . Iron deficiency anemia   . Long-term current use of steroids SYMBICORT INHALER  . 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 Sarcoidosis STABLE PER CXR JUNE 2013  . Shortness of breath   . Sickle cell trait [facility])     Patient Active Problem List   Diagnosis Date Noted  . Morbid obesity due to excess calories ([place]) 01/10/2017  . Dyspnea on exertion 11/09/2016  . Chronic respiratory failure with hypoxia and hypercapnia ([place]) 10/29/2016  . Malnutrition of moderate degree 10/10/2016  . Acute on chronic respiratory failure ([place]) 10/09/2016  . Difficulty in walking, not elsewhere classified   . Depression with anxiety 08/07/2016  . Subacromial impingement of left shoulder 07/19/2016  . Overweight (BMI 25.0-29.9) 05/11/2016  . Hot flashes 02/01/2016  . Constipation 01/13/2016  . Breast cancer of upper-outer quadrant of right female breast ([place]) 08/04/2015  . Tibial pain 06/03/2015  . Rotator cuff impingement syndrome 09/14/2014  . Menopausal syndrome (hot flashes) 01/19/2014  . GERD (gastroesophageal reflux disease) 12/21/2012  . Hyperlipidemia 12/21/2012  . SICKLE-CELL TRAIT 04/15/2008  . Sarcoidosis ([place]) 01/10/2007  . ANEMIA, IRON DEFICIENCY, UNSPEC. 01/10/2007  .  HYPERTENSION, BENIGN SYSTEMIC 01/10/2007  . COPD  GOLD III 01/10/2007    Past Surgical History:  Procedure Laterality Date  . ADJUSTABLE SUTURE MANIPULATION  05/22/2012   Procedure: ADJUSTABLE SUTURE MANIPULATION;  Surgeon: Dara Hoyer, MD;  Location: [facility];  Service: Ophthalmology;  Laterality: Right;  . CESAREAN SECTION  1986   W/ BILATERAL TUBAL LIGATION  . EYE SURGERY    . MEDIAN RECTUS REPAIR  05/22/2012   Procedure: MEDIAN RECTUS REPAIR;  Surgeon: Dara Hoyer, MD;  Location: [facility];  Service: Ophthalmology;  Laterality: Bilateral;  INFERIOR RECTUS RESECTION WITH ADJUSTIBLE SUTURES RIGHT EYE   . RADIOACTIVE SEED GUIDED MASTECTOMY WITH AXILLARY SENTINEL LYMPH NODE BIOPSY Right 08/18/2015   Procedure: RADIOACTIVE SEED GUIDED PARTIAL MASTECTOMY WITH AXILLARY SENTINEL LYMPH NODE BIOPSY;  Surgeon: Stark Klein, MD;  Location: [facility];  Service: General;  Laterality: Right;  . TOTAL ROBOTIC ASSISTED LAPAROSCOPIC HYSTERECTOMY  12-30-2010   SYMPTOMATIC UTERINE FIBROIDS  . UPPER TEETH EXTRACTION'S  1992    OB History    No data available       Home Medications    Prior to Admission medications   Medication Sig Start Date End Date Taking? Authorizing Provider  acetaminophen (TYLENOL) 500 MG tablet Take 1,000 mg by mouth every 6 (six) hours as needed for fever. Reported on 05/10/2016   Yes Historical Provider, MD  albuterol (VENTOLIN HFA) 108 (90 Base) MCG/ACT inhaler Inhale 2 puffs  into the lungs every 6 (six) hours as needed for wheezing or shortness of breath. 03/12/17  Yes Tanda Rockers, MD  anastrozole (ARIMIDEX) 1 MG tablet Take 1 tablet (1 mg total) by mouth at bedtime. 12/13/16  Yes Nicholas Lose, MD  atorvastatin (LIPITOR) 20 MG tablet Take 1 tablet (20 mg total) by mouth daily. 02/14/17  Yes Nicolette Bang, DO  diphenhydrAMINE (BENADRYL) 25 MG tablet Take 1 tablet (25 mg total) by mouth every 6 (six) hours  as needed. 02/05/17  Yes Nicolette Bang, DO  fluticasone (FLONASE) 50 MCG/ACT nasal spray Place 2 sprays into both nostrils daily as needed for allergies. 02/23/16  Yes Mariel Aloe, MD  gabapentin (NEURONTIN) 300 MG capsule Take 1 capsule (300 mg total) by mouth at bedtime. 02/06/17  Yes Nicholas Lose, MD  Glycopyrrolate-Formoterol (BEVESPI AEROSPHERE) 9-4.8 MCG/ACT AERO Inhale 2 puffs into the lungs 2 (two) times daily. 02/20/17  Yes Tanda Rockers, MD  metoprolol tartrate (LOPRESSOR) 25 MG tablet Take 1 tablet (25 mg total) by mouth 2 (two) times daily. 05/10/16  Yes Mariel Aloe, MD  Multiple Vitamin (MULTIVITAMIN) capsule Take 1 capsule by mouth daily.   Yes Historical Provider, MD  omeprazole (PRILOSEC) 20 MG capsule Take 1 capsule (20 mg total) by mouth daily. 09/21/16  Yes Donnamae Jude, MD  oxymetazoline (AFRIN NASAL SPRAY) 0.05 % nasal spray Place 1 spray into both nostrils 2 (two) times daily. 02/05/17  Yes Nicolette Bang, DO  predniSONE (DELTASONE) 10 MG tablet Take 10 mg by mouth daily with breakfast.   Yes Historical Provider, MD  triamterene-hydrochlorothiazide (MAXZIDE-25) 37.5-25 MG tablet TAKE ONE TABLET BY MOUTH ONCE DAILY 02/02/17  Yes Tanda Rockers, MD  albuterol (PROVENTIL HFA;VENTOLIN HFA) 108 (90 Base) MCG/ACT inhaler Inhale 2 puffs into the lungs every 6 (six) hours as needed for wheezing or shortness of breath. 05/11/16   Tanda Rockers, MD  doxycycline (VIBRAMYCIN) 100 MG capsule Take 1 capsule (100 mg total) by mouth 2 (two) times daily. 03/16/17   Tanna Furry, MD    Family History Family History  Problem Relation Age of Onset  . Hyperlipidemia Mother   . Asthma Mother   . Lupus Mother   . Breast cancer Mother 60  . Hypertension Father   . Hyperlipidemia Father   . Hypertension Sister   . Hypertension Brother   . Cancer Neg Hx   . Diabetes Neg Hx   . Coronary artery disease Neg Hx     Social History Social History  Substance Use Topics  .  Smoking status: Former Smoker    Packs/day: 1.00    Years: 20.00    Quit date: 11/14/2003  . Smokeless tobacco: Never Used  . Alcohol use No     Allergies   Codeine   Review of Systems Review of Systems  Constitutional: Negative for appetite change, chills, diaphoresis, fatigue and fever.  HENT: Negative for mouth sores, sore throat and trouble swallowing.   Eyes: Negative for visual disturbance.  Respiratory: Positive for shortness of breath. Negative for cough, chest tightness and wheezing.   Cardiovascular: Negative for chest pain.  Gastrointestinal: Negative for abdominal distention, abdominal pain, diarrhea, nausea and vomiting.  Endocrine: Negative for polydipsia, polyphagia and polyuria.  Genitourinary: Negative for dysuria, frequency and hematuria.  Musculoskeletal: Negative for gait problem.  Skin: Negative for color change, pallor and rash.  Neurological: Positive for dizziness. Negative for syncope, light-headedness and headaches.  Hematological: Does not bruise/bleed easily.  Psychiatric/Behavioral: Negative for behavioral problems and confusion.     Physical Exam Updated Vital Signs BP (!) 107/54 (BP Location: Left Arm)   Pulse 73   Temp 98.1 F (36.7 C) (Oral)   Resp 19   Ht _0  (1.651 m)   Wt 206 lb (93.4 kg)   SpO2 97%   BMI 34.28 kg/m   Physical Exam  Constitutional: She is oriented to person, place, and time. She appears well-developed and well-nourished. No distress.  HENT:  Head: Normocephalic.  Eyes: Conjunctivae are normal. Pupils are equal, round, and reactive to light. No scleral icterus.  Neck: Normal range of motion. Neck supple. No thyromegaly present.  Cardiovascular: Normal rate and regular rhythm.  Exam reveals no gallop and no friction rub.   No murmur heard. Pulmonary/Chest: Effort normal. No respiratory distress. She has wheezes. She has no rales.  Abdominal: Soft. Bowel sounds are normal. She exhibits no distension. There is no  tenderness. There is no rebound.  Musculoskeletal: Normal range of motion.  Neurological: She is alert and oriented to person, place, and time.  Skin: Skin is warm and dry. No rash noted.  Psychiatric: She has a normal mood and affect. Her behavior is normal.     ED Treatments / Results  Labs (all labs ordered are listed, but only abnormal results are displayed) Labs Reviewed  BASIC METABOLIC PANEL - Abnormal; Notable for the following:       Result Value   CO2 34 (*)    Glucose, Bld 117 (*)    All other components within normal limits  CBC WITH DIFFERENTIAL/PLATELET - Abnormal; Notable for the following:    Hemoglobin 11.6 (*)    All other components within normal limits    EKG  EKG Interpretation  Date/Time:  Friday Mar 16 2017 15:57:38 EDT Ventricular Rate:  59 PR Interval:    QRS Duration: 98 QT Interval:  436 QTC Calculation: 432 R Axis:   49 Text Interpretation:  Sinus rhythm RSR' in V1 or V2, right VCD or RVH Abnormal T, consider ischemia, lateral leads Borderline ST elevation, anterolateral leads Baseline wander in lead(s) V1 V2 V3 V4 V5 V6 Confirmed by Jeneen Rinks  MD, [place]hall Nizhoni ([id number]) on 03/16/2017 4:25:31 PM       Radiology Dg Chest 2 View  Result Date: 03/16/2017 CLINICAL DATA:  Pt states she had an episode today of feeling hot and like she was going to pass out. Pt states she felt light headed. Hx COPD, htn, sarcoidosis EXAM: CHEST  2 VIEW COMPARISON:  10/06/2016 FINDINGS: Normal cardiac silhouette. There is a band of atelectasis and scarring in the RIGHT middle lobe unchanged from comparison CT. Lingular nodule seen on comparison CT is not appreciated on this scan. Emphysematous change in the upper lobes. IMPRESSION: 1. No acute cardiopulmonary findings. 2. Emphysematous change. 3. RIGHT mid lung scarring and atelectasis. Electronically Signed   By: Suzy Bouchard M.D.   On: 03/16/2017 17:12    Procedures Procedures (including critical care time)  Medications Ordered  in ED Medications  albuterol (PROVENTIL) (2.5 MG/3ML) 0.083% nebulizer solution 5 mg (5 mg Nebulization Not Given 03/16/17 1637)  albuterol (PROVENTIL) (2.5 MG/3ML) 0.083% nebulizer solution 2.5 mg (2.5 mg Nebulization Given 03/16/17 1637)     Initial Impression / Assessment and Plan / ED Course  I have reviewed the triage vital signs and the nursing notes.  Pertinent labs & imaging results that were available during my care of the patient were reviewed by me and  considered in my medical decision making (see chart for details).     Patient clears after single nebulized albuterol. Chest x-ray shows no acute abnormalities. Reassuring labs.  She was ambulated on HER-2 liters and drops into the low 80s. On her 4 L she maintains 90%. Restoril keep on 2 L at rest and at night into an bili 4 L. Primary care follow-up. Prednisone.  Final Clinical Impressions(s) / ED Diagnoses   Final diagnoses:  COPD exacerbation ([place])    New Prescriptions Discharge Medication List as of 03/16/2017 10:33 PM    START taking these medications   Details  doxycycline (VIBRAMYCIN) 100 MG capsule Take 1 capsule (100 mg total) by mouth 2 (two) times daily., Starting Fri 03/16/2017, Print         Tanna Furry, MD 03/17/17 0010

## 2017-03-28 ENCOUNTER — Other Ambulatory Visit: Payer: Self-pay | Admitting: Family Medicine

## 2017-03-28 DIAGNOSIS — K219 Gastro-esophageal reflux disease without esophagitis: Secondary | ICD-10-CM

## 2017-03-29 ENCOUNTER — Ambulatory Visit (INDEPENDENT_AMBULATORY_CARE_PROVIDER_SITE_OTHER): Payer: BLUE CROSS/BLUE SHIELD | Admitting: Family Medicine

## 2017-03-29 ENCOUNTER — Encounter: Payer: Self-pay | Admitting: Family Medicine

## 2017-03-29 ENCOUNTER — Ambulatory Visit
Admission: RE | Admit: 2017-03-29 | Discharge: 2017-03-29 | Disposition: A | Discharge status: Home / Self Care | Payer: BLUE CROSS/BLUE SHIELD | Source: Ambulatory Visit | Attending: Family Medicine | Admitting: Family Medicine

## 2017-03-29 VITALS — BP 122/78 | HR 89 | Temp 98.3°F | Ht 65.0 in | Wt 210.8 lb

## 2017-03-29 DIAGNOSIS — M25462 Effusion, left knee: Secondary | ICD-10-CM | POA: Diagnosis not present

## 2017-03-29 DIAGNOSIS — M25562 Pain in left knee: Secondary | ICD-10-CM

## 2017-03-29 DIAGNOSIS — M25571 Pain in right ankle and joints of right foot: Secondary | ICD-10-CM | POA: Diagnosis not present

## 2017-03-29 DIAGNOSIS — M25569 Pain in unspecified knee: Secondary | ICD-10-CM | POA: Insufficient documentation

## 2017-03-29 MED ORDER — IBUPROFEN 600 MG PO TABS: 600.0000 mg | ORAL_TABLET | Freq: Three times a day (TID) | ORAL | 0 refills | Status: DC | PRN

## 2017-03-29 NOTE — Assessment & Plan Note (Signed)
Unclear etiology has history of this pain is not indicative of any clear pathology. There is some swelling on the lateral aspect of her right ankle, this can represents a small effusion versus possible lipoma. Does not affect ambulation. -Conservative measures claudication (same as knee assessment and plan) -Follow-up in 2-3 weeks

## 2017-03-29 NOTE — Progress Notes (Signed)
Subjective:    Patient ID: [name] , female   DOB: 1961/07/06 , 56 y.o..   MRN: [id number]  HPI  [name] is here for a same day visit for Chief Complaint  Patient presents with  . Leg Pain    1. Left Knee Pain and swelling:  Patient notes that about 1 week ago she was walking in her house and it felt like her left knee "slid inwards". She felt a pop in her left knee and then immediately had pain afterwards. It was painful for her to ambulate afterwards although she was able to walk okay. The day after this happened she had swelling in all of her left knee. She notes that the pain as a 5 out of 10 now and feels "tight". She has never had knee problems in the past. Denies any fevers, chills, erythema of her joint.  2. Right ankle knot: Patient noticed this incidentally about 1 or 2 weeks ago. She notes that on the lateral aspect of her right ankle she developed "knot". This not is painful to touch but not painful with ambulation. She denies any trauma to this area.  Review of Systems: Per HPI.   Past Medical History: Patient Active Problem List   Diagnosis Date Noted  . Knee pain 03/29/2017  . Right ankle pain 03/29/2017  . Morbid obesity due to excess calories ([place]) 01/10/2017  . Dyspnea on exertion 11/09/2016  . Chronic respiratory failure with hypoxia and hypercapnia ([place]) 10/29/2016  . Malnutrition of moderate degree 10/10/2016  . Acute on chronic respiratory failure ([place]) 10/09/2016  . Difficulty in walking, not elsewhere classified   . Depression with anxiety 08/07/2016  . Subacromial impingement of left shoulder 07/19/2016  . Overweight (BMI 25.0-29.9) 05/11/2016  . Hot flashes 02/01/2016  . Constipation 01/13/2016  . Breast cancer of upper-outer quadrant of right female breast ([place]) 08/04/2015  . Tibial pain 06/03/2015  . Rotator cuff impingement syndrome 09/14/2014  . Menopausal syndrome (hot flashes) 01/19/2014  . GERD (gastroesophageal reflux disease)  12/21/2012  . Hyperlipidemia 12/21/2012  . SICKLE-CELL TRAIT 04/15/2008  . Sarcoidosis ([place]) 01/10/2007  . ANEMIA, IRON DEFICIENCY, UNSPEC. 01/10/2007  . HYPERTENSION, BENIGN SYSTEMIC 01/10/2007  . COPD  GOLD III 01/10/2007    Medications: reviewed   Social Hx:  reports that she quit smoking about 13 years ago. She has a 20.00 pack-year smoking history. She has never used smokeless tobacco.   Objective:   BP 122/78   Pulse 89   Temp 98.3 F (36.8 C) (Oral)   Ht 5\' 5"  (1.651 m)   Wt 210 lb 12.8 oz (95.6 kg)   SpO2 91%   BMI 35.08 kg/m  Physical Exam  Gen: NAD, alert, cooperative with exam, well-appearing MSK:  Right Knee: Normal to inspection with no erythema or effusion or obvious bony abnormalities. Palpation normal with no warmth, joint line tenderness, patellar tenderness, or condyle tenderness. ROM full in flexion and extension and lower leg rotation.  Left Knee: Upon inspection, effusion of the knee noted particularly on the medial aspect. No obvious bony abnormalities. Palpation is painful along the lateral and medial joint lines. No warmth, patellar tenderness, or condyle tenderness. ROM full in flexion and extension and lower leg rotation. Ligaments with solid consistent endpoints including ACL, PCL, LCL, MCL. Negative [place] tests. Non painful patellar compression. Patellar glide without crepitus. Patellar and quadriceps tendons unremarkable. Hamstring and quadriceps strength is normal.  Neurovascularly intact  Right Ankle: Visible swelling on  lateral aspect of ankle, no ecchymosis, erythema, ulcers, calluses, blister Full in plantarflexion, dorsiflexion, inversion, and eversion of the foot; flexion and extension of the toes Strength: 5/5 in all directions. Neurovascularly intact  Assessment & Plan:  Knee pain Acute left knee pain associated with effusion. Likely secondary to ligament strain when her knee bent while walking. Less likely tear as no  abnormalities on physical exam and testing the anterior cruciate ligament, PCL, LCL, and MCL. No signs of meniscus problems and physical exam either. No signs of septic arthritis. -We'll obtain a left knee x-ray as patient is on chronic steroids and at risk for fracture -Conservative measures including rest, ice, compression, elevation and ibuprofen when necessary -Follow-up in 2-3 weeks to see how she is doing -If pain and swelling do not improve, may need to refer to sports medicine  Right ankle pain Unclear etiology has history of this pain is not indicative of any clear pathology. There is some swelling on the lateral aspect of her right ankle, this can represents a small effusion versus possible lipoma. Does not affect ambulation. -Conservative measures claudication (same as knee assessment and plan) -Follow-up in 2-3 weeks   Meds ordered this encounter  Medications  . ibuprofen (ADVIL,MOTRIN) 600 MG tablet    Sig: Take 1 tablet (600 mg total) by mouth every 8 (eight) hours as needed.    Dispense:  30 tablet    Refill:  0    Smitty Cords, MD [place], PGY-2

## 2017-03-29 NOTE — Patient Instructions (Signed)
Thank you for coming in today, it was so nice to see you! Today we talked about:    Left knee pain: This may be due to a ligament strain. We will order an XRAY of your knee. You can go to [facility] for this. Additionally, you can take 600 mg of Ibuprofen every 8 hours as needed. I have sent a refill for this medication to your pharmacy.   Please follow up in 2-3 weeks for your knee pain. You can schedule this appointment at the front desk before you leave or call the clinic.  Bring in all your medications or supplements to each appointment for review.   If we ordered any tests today, you will be notified via telephone of any abnormalities. If everything is normal you will get a letter in the mail.   If you have any questions or concerns, please do not hesitate to call the office at [phone]. You can also message me directly via MyChart.   Sincerely,  Smitty Cords, MD   Knee Pain, Adult Many things can cause knee pain. The pain often goes away on its own with time and rest. If the pain does not go away, tests may be done to find out what is causing the pain. Follow these instructions at home: Activity   Rest your knee.  Do not do things that cause pain.  Avoid activities where both feet leave the ground at the same time (high-impact activities). Examples are running, jumping rope, and doing jumping jacks. General instructions   Take medicines only as told by your doctor.  Raise (elevate) your knee when you are resting. Make sure your knee is higher than your heart.  Sleep with a pillow under your knee.  If told, put ice on the knee:  Put ice in a plastic bag.  Place a towel between your skin and the bag.  Leave the ice on for 20 minutes, 2-3 times a day.  Ask your doctor if you should wear an elastic knee support.  Lose weight if you are overweight. Being overweight can make your knee hurt more.  Do not use any tobacco products. These include  cigarettes, chewing tobacco, or electronic cigarettes. If you need help quitting, ask your doctor. Smoking may slow down healing. Contact a doctor if:  The pain does not stop.  The pain changes or gets worse.  You have a fever along with knee pain.  Your knee gives out or locks up.  Your knee swells, and becomes worse. Get help right away if:  Your knee feels warm.  You cannot move your knee.  You have very bad knee pain.  You have chest pain.  You have trouble breathing. Summary  Many things can cause knee pain. The pain often goes away on its own with time and rest.  Avoid activities that put stress on your knee. These include running and jumping rope.  Get help right away if you cannot move your knee, or if your knee feels warm, or if you have trouble breathing. This information is not intended to replace advice given to you by your health care provider. Make sure you discuss any questions you have with your health care provider. Document Released: 01/26/2009 Document Revised: 10/24/2016 Document Reviewed: 10/24/2016 Elsevier Interactive Patient Education  2017 [employer].

## 2017-03-29 NOTE — Assessment & Plan Note (Addendum)
Acute left knee pain associated with effusion. Likely secondary to ligament strain when her knee bent while walking. Less likely tear as no abnormalities on physical exam and testing the anterior cruciate ligament, PCL, LCL, and MCL. No signs of meniscus problems and physical exam either. No signs of septic arthritis. -We'll obtain a left knee x-ray as patient is on chronic steroids and at risk for fracture -Conservative measures including rest, ice, compression, elevation and ibuprofen when necessary -Follow-up in 2-3 weeks to see how she is doing -If pain and swelling do not improve, may need to refer to sports medicine

## 2017-04-09 DIAGNOSIS — J449 Chronic obstructive pulmonary disease, unspecified: Secondary | ICD-10-CM | POA: Diagnosis not present

## 2017-04-12 ENCOUNTER — Encounter: Payer: Self-pay | Admitting: Internal Medicine

## 2017-04-12 ENCOUNTER — Ambulatory Visit (INDEPENDENT_AMBULATORY_CARE_PROVIDER_SITE_OTHER): Payer: BLUE CROSS/BLUE SHIELD | Admitting: Internal Medicine

## 2017-04-12 VITALS — BP 142/80 | HR 74 | Temp 98.8°F | Ht 65.0 in | Wt 209.4 lb

## 2017-04-12 DIAGNOSIS — M25471 Effusion, right ankle: Secondary | ICD-10-CM

## 2017-04-12 DIAGNOSIS — M25562 Pain in left knee: Secondary | ICD-10-CM | POA: Diagnosis not present

## 2017-04-12 NOTE — Progress Notes (Signed)
Subjective:     Patient ID: [name], female   DOB: [birth year], 56 y.o.   MRN: [id number]  HPI Left Knee pain and swelling: Patient was seen on 03/29/17 for 1 week of left knee pain after hearing a pop while attempting to exercise. Pain is still a 5/10 when walking but not painful at rest. Uses a stationary bike at home for 10 mins a day that has helped with pain. She took Ibuprofen twice that she felt alleviated some pain and swelling but would rather not take it. She attempted to use ice but was only able to maintain a few minutes of contact once. Denies any fever, chills, or erythema of knee joint.  Right ankle swelling: Patient was seen on 03/29/17 for 2 week history of swollen knot on the lateral aspect of her right ankle. She notes that there has been no decrease in swelling since last visit. Area is mildly tender to palpitation but not painful with ambulation or passive movement of foot. Denies any change in size, color, or characterization of knot.  Review of Systems as per HPI    Objective:   Physical Exam  Gen: NAD, alert, cooperative with exam, well-appearing MSK: Left Knee: Effusion of knee noted with inspection, no obvious bony deformities. Tender to palpitation along the medial joint line. No warmth or patellar tenderness. Full ROM in flexion and extension. Ligaments with solid consistent endpoints in ACL, PCL, MCL, LCL.  Mild tenderness with valgus force along the medial aspect of the knee. Non painful patellar compression.  Right Ankle:  Visible swelling along the lateral aspect of right ankle, no erythema, ulcers, blisters, or rash. Mildly tender to palpitation.  Full ROM in plantarflexion, dorsiflexion, inversion, and eversion of foot. Strength: 5/5 in a ll directions     Assessment/Plan:  Left knee: Pt presents with 3 weeks of continued knee pain with ambulation after hearing "pop" while working out. She has tenderness to palpation along medial joint line of left knee  and pain along medial aspect of knee with application of valgus force to leg. Given solid consistent endpoint of MCL along with other physical exam findings patient most likely has an MCL sprain.  -Patient advised to continue NSAIDs, ice, and rest.  -Referred to physical therapy  Right Ankle: Unclear etiology as patient is unaware of any trauma or even when pathology presented. Tenderness to palpitation of soft fluid like bulge on lateral aspect of foot.  -imaging for further evaluation

## 2017-04-12 NOTE — Patient Instructions (Addendum)
I suspect you have a ligament strain of your left inner knee after your injury. Please ice the area especially after use. Continue ibuprofen as needed. I have placed a referral to physical therapy.   I have ordered an xray of your right ankle. Please go to [facility] to have this done and I will call you with the results.

## 2017-04-17 ENCOUNTER — Ambulatory Visit
Admission: RE | Admit: 2017-04-17 | Discharge: 2017-04-17 | Disposition: A | Discharge status: Home / Self Care | Payer: BLUE CROSS/BLUE SHIELD | Source: Ambulatory Visit | Attending: Family Medicine | Admitting: Family Medicine

## 2017-04-17 ENCOUNTER — Telehealth: Payer: Self-pay | Admitting: *Deleted

## 2017-04-17 DIAGNOSIS — M25471 Effusion, right ankle: Secondary | ICD-10-CM

## 2017-04-17 DIAGNOSIS — R600 Localized edema: Secondary | ICD-10-CM | POA: Diagnosis not present

## 2017-04-17 NOTE — Telephone Encounter (Signed)
-----   Message from Nicolette Bang, DO sent at 04/17/2017 10:57 AM EDT ----- Please call patient to let her know that the ankle did not have any bony abnormalities. It did show mild soft tissue swelling consistent with what is present on her exam.

## 2017-04-17 NOTE — Telephone Encounter (Signed)
Pt informed of below. Zimmerman Rumple, April D, CMA  

## 2017-04-20 ENCOUNTER — Telehealth: Payer: Self-pay | Admitting: Internal Medicine

## 2017-04-20 MED ORDER — GLYCOPYRROLATE-FORMOTEROL 9-4.8 MCG/ACT IN AERO: 2.0000 | INHALATION_SPRAY | Freq: Two times a day (BID) | RESPIRATORY_TRACT | 0 refills | Status: DC

## 2017-04-20 NOTE — Telephone Encounter (Signed)
Spoke with the pt  She states that the Bevespi is not covered by her ins  I left her 2 samples of this  She states that the Symbicort is covered  She states ins said MW could do a PA if he felt like she needed Symbicort  Please advise thanks

## 2017-04-20 NOTE — Telephone Encounter (Signed)
symbiocort is fine unless she feels a great deal of difference (worse) on it vs bevespi in which case go back to bevespi samples until next ov and we'll regroup then

## 2017-04-23 ENCOUNTER — Encounter: Payer: Self-pay | Admitting: Physical Therapy

## 2017-04-23 ENCOUNTER — Ambulatory Visit: Payer: BLUE CROSS/BLUE SHIELD | Attending: Nurse Practitioner | Admitting: Physical Therapy

## 2017-04-23 DIAGNOSIS — M6281 Muscle weakness (generalized): Secondary | ICD-10-CM

## 2017-04-23 DIAGNOSIS — M25562 Pain in left knee: Secondary | ICD-10-CM | POA: Diagnosis not present

## 2017-04-23 DIAGNOSIS — R262 Difficulty in walking, not elsewhere classified: Secondary | ICD-10-CM

## 2017-04-23 NOTE — Telephone Encounter (Signed)
lmtcb for pt.  

## 2017-04-23 NOTE — Therapy (Signed)
[name], [place], [address] Phone: [phone]   Fax:  [phone]  Physical Therapy Evaluation  Patient Details  Name: [name] MRN: [id number] Date of Birth: 1961-03-14 Referring Provider: Nicolette Bang, DO  Encounter Date: 04/23/2017      PT End of Session - 04/23/17 0857    Visit Number 1   Number of Visits 9   Date for PT Re-Evaluation 05/25/17   Authorization Type BCBS- 30 visit limit   PT Start Time 6333  pt arrived late   PT Stop Time 0928   PT Time Calculation (min) 31 min   Activity Tolerance Patient tolerated treatment well;Other (comment)  Limited by oxygen saturation   Behavior During Therapy WFL for tasks assessed/performed      Past Medical History:  Diagnosis Date  . Allergic rhinitis   . COPD, mild ([place]) FOLLOWED BY DR Melvyn Novas  . GERD (gastroesophageal reflux disease)   . HTN (hypertension)   . Hypertension   . Iron deficiency anemia   . Long-term current use of steroids SYMBICORT INHALER  . 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 Sarcoidosis STABLE PER CXR JUNE 2013  . Shortness of breath   . Sickle cell trait [facility])     Past Surgical History:  Procedure Laterality Date  . ADJUSTABLE SUTURE MANIPULATION  05/22/2012   Procedure: ADJUSTABLE SUTURE MANIPULATION;  Surgeon: Dara Hoyer, MD;  Location: [facility];  Service: Ophthalmology;  Laterality: Right;  . CESAREAN SECTION  1986   W/ BILATERAL TUBAL LIGATION  . EYE SURGERY    . MEDIAN RECTUS REPAIR  05/22/2012   Procedure: MEDIAN RECTUS REPAIR;  Surgeon: Dara Hoyer, MD;  Location: [facility];  Service: Ophthalmology;  Laterality: Bilateral;  INFERIOR RECTUS RESECTION WITH ADJUSTIBLE SUTURES RIGHT EYE   . RADIOACTIVE SEED GUIDED MASTECTOMY WITH AXILLARY SENTINEL LYMPH NODE BIOPSY Right 08/18/2015   Procedure: RADIOACTIVE SEED GUIDED PARTIAL MASTECTOMY WITH AXILLARY SENTINEL LYMPH NODE  BIOPSY;  Surgeon: Stark Klein, MD;  Location: [place];  Service: General;  Laterality: Right;  . TOTAL ROBOTIC ASSISTED LAPAROSCOPIC HYSTERECTOMY  12-30-2010   SYMPTOMATIC UTERINE FIBROIDS  . UPPER TEETH EXTRACTION'S  1992    There were no vitals filed for this visit.       Subjective Assessment - 04/23/17 0901    Subjective Pt reports she did a jumping jack and came down on knee, felt a pop (about 1 month ago). Feels swollen on medial aspect of knee. Feels like the knee is going inward when walking. Knee might give out when standing up too fast.    How long can you stand comfortably? 15-20 min   How long can you walk comfortably? can walk grocery store   Patient Stated Goals decrease pain, walk more, no longer "feel funny" or give out   Currently in Pain? Yes   Pain Score 4    Pain Location Knee   Pain Orientation Left;Medial   Pain Descriptors / Indicators Aching   Aggravating Factors  getting up too quickly, walking long distances   Pain Relieving Factors ride stationary bike, prop leg up            [facility] PT Assessment - 04/23/17 0001      Assessment   Medical Diagnosis actue pain L knee   Referring Provider Nicolette Bang, DO   Onset Date/Surgical Date --  one month ago   Hand Dominance Right  Next MD Visit PRN   Prior Therapy none     Precautions   Precaution Comments to tolerance with O2     Restrictions   Weight Bearing Restrictions No     Balance Screen   Has the patient fallen in the past 6 months No     [place] residence   Living Arrangements Alone   Additional Comments 6 steps up to apartment     Prior Function   Level of Independence Independent     Cognition   Overall Cognitive Status Within Functional Limits for tasks assessed     Observation/Other Assessments   Focus on Therapeutic Outcomes (FOTO)  46% ability (goal 63%)     Sensation   Additional Comments WFL     ROM  / Strength   AROM / PROM / Strength Strength     Strength   Strength Assessment Site Knee;Hip   Right/Left Hip Right;Left   Right Hip Flexion 4-/5   Right Hip Extension 3/5   Right Hip ABduction 4-/5   Left Hip Flexion 4-/5   Left Hip Extension 3-/5   Left Hip ABduction 4-/5   Right/Left Knee Right;Left   Right Knee Flexion 5/5   Right Knee Extension 5/5   Left Knee Flexion 4/5   Left Knee Extension 5/5     Palpation   Patella mobility WFL   Palpation comment lateral knee mild TTP; bilateral varus laxity            Objective measurements completed on examination: See above findings.                  PT Education - 04/23/17 1118    Education provided Yes   Education Details anatomy of condition, POC, HEP, exercise form/rationale, ice, use of home bike, oxygen use in UE vs LE work   [employer]) Educated Patient   Methods Explanation;Tactile cues;Verbal cues;Handout   Comprehension Verbalized understanding;Verbal cues required;Tactile cues required;Need further instruction             PT Long Term Goals - 04/23/17 1128      PT LONG TERM GOAL #1   Title FOTO to 63% ability to indicate significant improvement in fucntional ability by 7/13   Baseline  46% at eval   Time 4   Period Weeks   Status New     PT LONG TERM GOAL #2   Title Pt will be able to ascend & descend stairs step over step with UE support   Baseline leads with the R at eval   Time 4   Period Weeks   Status New     PT LONG TERM GOAL #3   Title Pt will verbalize at least 75% improvement with knee "feeling funny" and "giving out"   Baseline feels this frequently at eval   Time 4   Period Weeks   Status New     PT LONG TERM GOAL #4   Title Average pain <=2/10 to decrease pain effects on daily activities   Baseline 4/10 at rest at eval   Time 4   Period Mentor - 04/23/17 3664    Clinical Impression Statement Pt presents to PT with  complaints of L knee pain after trying to do jumping jacks but felt a pop in her L knee. Pt has significant varus laxity in bilateral  knees and weakness in proximal hip musculature. Denies TTP or pain with special tests. Mild edema noted at L knee. Pt requires rest breaks due to poor oxygen saturation and is aware of when she needs it. Required rest break after MMT today to don oxygen. pt will benefit from skilled PT to improve proximal strength to provide support to knee joints and return her to a regular exercise program    History and Personal Factors relevant to plan of care: anemia, COPD, respiratory failure   Clinical Presentation Stable   Clinical Decision Making Low   Rehab Potential Good   PT Frequency 2x / week   PT Duration 4 weeks   PT Treatment/Interventions ADLs/Self Care Home Management;Cryotherapy;Electrical Stimulation;Iontophoresis 4mg /ml Dexamethasone;Functional mobility training;Stair training;Gait training;Ultrasound;Moist Heat;Therapeutic activities;Therapeutic exercise;Balance training;Neuromuscular re-education;Patient/family education;Passive range of motion;Manual techniques;Dry needling;Taping   PT Next Visit Plan nu step, hip strengthening, CKC mini squats   PT Home Exercise Plan hooklying clam greeen tband, ice 2/day 10-15 min, ride stationary bike 10 min/day   Consulted and Agree with Plan of Care Patient      Patient will benefit from skilled therapeutic intervention in order to improve the following deficits and impairments:  Abnormal gait, Difficulty walking, Decreased endurance, Decreased activity tolerance, Hypermobility, Pain, Improper body mechanics, Decreased balance, Decreased strength, Increased edema, Postural dysfunction  Visit Diagnosis: Acute pain of left knee - Plan: PT plan of care cert/re-cert  Difficulty in walking, not elsewhere classified - Plan: PT plan of care cert/re-cert  Muscle weakness (generalized) - Plan: PT plan of care  cert/re-cert     Problem List Patient Active Problem List   Diagnosis Date Noted  . Knee pain 03/29/2017  . Right ankle pain 03/29/2017  . Morbid obesity due to excess calories ([place]) 01/10/2017  . Dyspnea on exertion 11/09/2016  . Chronic respiratory failure with hypoxia and hypercapnia ([place]) 10/29/2016  . Malnutrition of moderate degree 10/10/2016  . Acute on chronic respiratory failure ([place]) 10/09/2016  . Difficulty in walking, not elsewhere classified   . Depression with anxiety 08/07/2016  . Subacromial impingement of left shoulder 07/19/2016  . Overweight (BMI 25.0-29.9) 05/11/2016  . Hot flashes 02/01/2016  . Constipation 01/13/2016  . Breast cancer of upper-outer quadrant of right female breast ([place]) 08/04/2015  . Tibial pain 06/03/2015  . Rotator cuff impingement syndrome 09/14/2014  . Menopausal syndrome (hot flashes) 01/19/2014  . GERD (gastroesophageal reflux disease) 12/21/2012  . Hyperlipidemia 12/21/2012  . SICKLE-CELL TRAIT 04/15/2008  . Sarcoidosis ([place]) 01/10/2007  . ANEMIA, IRON DEFICIENCY, UNSPEC. 01/10/2007  . HYPERTENSION, BENIGN SYSTEMIC 01/10/2007  . COPD  GOLD III 01/10/2007   Jesiah Grismer C. Christia Domke PT, DPT 04/23/17 11:35 AM   [facility] [address] [place], [place], [address] Phone: [phone]   Fax:  [phone]  Name: [name] MRN: [id number] Date of Birth: 07-Mar-1961

## 2017-04-24 NOTE — Telephone Encounter (Signed)
Pt returned phone call, pt contact # [phone].[name]

## 2017-04-24 NOTE — Telephone Encounter (Signed)
lmtcb x1 for pt. 

## 2017-04-24 NOTE — Telephone Encounter (Signed)
lmomtcb x 2 for the pt.  

## 2017-04-25 NOTE — Telephone Encounter (Signed)
Called and spoke with pt and she is aware of MW recs.  She stated that she has a symbicort that she can use and if she has any issues to call back and we can try the samples of bevespi and bring her in for another appt.  Pt voiced her understanding.

## 2017-04-30 ENCOUNTER — Ambulatory Visit: Payer: BLUE CROSS/BLUE SHIELD | Admitting: Physical Therapy

## 2017-04-30 DIAGNOSIS — M25562 Pain in left knee: Secondary | ICD-10-CM | POA: Diagnosis not present

## 2017-04-30 DIAGNOSIS — R262 Difficulty in walking, not elsewhere classified: Secondary | ICD-10-CM

## 2017-04-30 DIAGNOSIS — M6281 Muscle weakness (generalized): Secondary | ICD-10-CM

## 2017-04-30 NOTE — Patient Instructions (Signed)
Mini Squat: Double Leg    With feet shoulder width apart, reach forward for balance and do a mini squat. Keep knees in line with second toe. Knees do not go past toes. Repeat _10__ times per set. Rest _5__ seconds after set. Do _2__ sets per session.  http://plyo.exer.us/70   Copyright  VHI. All rights reserved.

## 2017-04-30 NOTE — Therapy (Signed)
[place], [place], [address] Phone: [phone]   Fax:  [phone]  Physical Therapy Treatment  Patient Details  Name: [name] MRN: [id number] Date of Birth: October 15, 1961 Referring Provider: Nicolette Bang, DO  Encounter Date: 04/30/2017      PT End of Session - 04/30/17 1159    Visit Number 2   Number of Visits 9   Date for PT Re-Evaluation 05/25/17   Authorization Type BCBS- 30 visit limit   PT Start Time 1140   PT Stop Time 1220   PT Time Calculation (min) 40 min      Past Medical History:  Diagnosis Date  . Allergic rhinitis   . COPD, mild ([place]) FOLLOWED BY DR Melvyn Novas  . GERD (gastroesophageal reflux disease)   . HTN (hypertension)   . Hypertension   . Iron deficiency anemia   . Long-term current use of steroids SYMBICORT INHALER  . 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 Sarcoidosis STABLE PER CXR JUNE 2013  . Shortness of breath   . Sickle cell trait [facility])     Past Surgical History:  Procedure Laterality Date  . ADJUSTABLE SUTURE MANIPULATION  05/22/2012   Procedure: ADJUSTABLE SUTURE MANIPULATION;  Surgeon: Dara Hoyer, MD;  Location: [facility];  Service: Ophthalmology;  Laterality: Right;  . CESAREAN SECTION  1986   W/ BILATERAL TUBAL LIGATION  . EYE SURGERY    . MEDIAN RECTUS REPAIR  05/22/2012   Procedure: MEDIAN RECTUS REPAIR;  Surgeon: Dara Hoyer, MD;  Location: [facility];  Service: Ophthalmology;  Laterality: Bilateral;  INFERIOR RECTUS RESECTION WITH ADJUSTIBLE SUTURES RIGHT EYE   . RADIOACTIVE SEED GUIDED MASTECTOMY WITH AXILLARY SENTINEL LYMPH NODE BIOPSY Right 08/18/2015   Procedure: RADIOACTIVE SEED GUIDED PARTIAL MASTECTOMY WITH AXILLARY SENTINEL LYMPH NODE BIOPSY;  Surgeon: Stark Klein, MD;  Location: [place];  Service: General;  Laterality: Right;  . TOTAL ROBOTIC ASSISTED LAPAROSCOPIC HYSTERECTOMY  12-30-2010   SYMPTOMATIC UTERINE FIBROIDS  . UPPER TEETH EXTRACTION'S  1992    There were no vitals filed for this visit.      Subjective Assessment - 04/30/17 1140    Subjective It was hurting a little bit yesterday. I rode my stationary bike two times.    Currently in Pain? Yes   Pain Score 2    Pain Location Knee   Pain Orientation Left   Pain Descriptors / Indicators Nagging   Aggravating Factors  being up to long    Pain Relieving Factors ride stationary bike                         [facility] Adult PT Treatment/Exercise - 04/30/17 0001               Knee/Hip Exercises: Aerobic   Nustep Aerobic L2 x 5 minutes    SP02 95%      Knee/Hip Exercises: Standing   Knee Flexion 10 reps;2 sets   Hip Abduction 10 reps;2 sets   Functional Squat Limitations mini squats 10 x 2      Knee/Hip Exercises: Seated   Long Arc Quad 20 reps   Hamstring Curl 10 reps;2 sets   Hamstring Limitations green     Knee/Hip Exercises: Supine   Quad Sets 10 reps   Bridges 10 reps   Straight Leg Raises 10 reps;Both   Straight Leg Raises Limitations cues to exhale with lift, neutral  spine    Other Supine Knee/Hip Exercises clam with green band x 20                 PT Education - 04/30/17 1226    Education provided Yes   Education Details Mini squat    Person(s) Educated Patient   Methods Explanation;Handout   Comprehension Verbalized understanding             PT Long Term Goals - 04/23/17 1128      PT LONG TERM GOAL #1   Title FOTO to 63% ability to indicate significant improvement in fucntional ability by 7/13   Baseline  46% at eval   Time 4   Period Weeks   Status New     PT LONG TERM GOAL #2   Title Pt will be able to ascend & descend stairs step over step with UE support   Baseline leads with the R at eval   Time 4   Period Weeks   Status New     PT LONG TERM GOAL #3   Title Pt will verbalize at least 75% improvement with knee "feeling funny" and "giving  out"   Baseline feels this frequently at eval   Time 4   Period Weeks   Status New     PT LONG TERM GOAL #4   Title Average pain <=2/10 to decrease pain effects on daily activities   Baseline 4/10 at rest at eval   Time 4   Period Weeks   Status New               Plan - 04/30/17 1233    Clinical Impression Statement Pt did not take theraband home to do HEP with. Reviewed HEP and gave pt theraband. Began Nustep and added to HEP. Monitored SPo2 throughout with cues for pursed lip breathing throughout. No increased pain.    PT Next Visit Plan nu step, hip strengthening, CKC mini squats   PT Home Exercise Plan hooklying clam greeen tband, ice 2/day 10-15 min, ride stationary bike 10 min/day   Consulted and Agree with Plan of Care Patient      Patient will benefit from skilled therapeutic intervention in order to improve the following deficits and impairments:     Visit Diagnosis: Acute pain of left knee  Difficulty in walking, not elsewhere classified  Muscle weakness (generalized)     Problem List Patient Active Problem List   Diagnosis Date Noted  . Knee pain 03/29/2017  . Right ankle pain 03/29/2017  . Morbid obesity due to excess calories ([place]) 01/10/2017  . Dyspnea on exertion 11/09/2016  . Chronic respiratory failure with hypoxia and hypercapnia ([place]) 10/29/2016  . Malnutrition of moderate degree 10/10/2016  . Acute on chronic respiratory failure ([place]) 10/09/2016  . Difficulty in walking, not elsewhere classified   . Depression with anxiety 08/07/2016  . Subacromial impingement of left shoulder 07/19/2016  . Overweight (BMI 25.0-29.9) 05/11/2016  . Hot flashes 02/01/2016  . Constipation 01/13/2016  . Breast cancer of upper-outer quadrant of right female breast ([place]) 08/04/2015  . Tibial pain 06/03/2015  . Rotator cuff impingement syndrome 09/14/2014  . Menopausal syndrome (hot flashes) 01/19/2014  . GERD (gastroesophageal reflux disease) 12/21/2012  .  Hyperlipidemia 12/21/2012  . SICKLE-CELL TRAIT 04/15/2008  . Sarcoidosis ([place]) 01/10/2007  . ANEMIA, IRON DEFICIENCY, UNSPEC. 01/10/2007  . HYPERTENSION, BENIGN SYSTEMIC 01/10/2007  . COPD  GOLD III 01/10/2007    Dorene Ar, PTA 04/30/2017, 1:51 PM  [place], [place], [address] Phone: [phone]   Fax:  [phone]  Name: [name] MRN: [id number] Date of Birth: 1961/04/21

## 2017-05-02 ENCOUNTER — Ambulatory Visit: Payer: BLUE CROSS/BLUE SHIELD | Admitting: Physical Therapy

## 2017-05-07 ENCOUNTER — Ambulatory Visit: Payer: BLUE CROSS/BLUE SHIELD | Admitting: Physical Therapy

## 2017-05-07 DIAGNOSIS — R262 Difficulty in walking, not elsewhere classified: Secondary | ICD-10-CM

## 2017-05-07 DIAGNOSIS — M25562 Pain in left knee: Secondary | ICD-10-CM | POA: Diagnosis not present

## 2017-05-07 DIAGNOSIS — M6281 Muscle weakness (generalized): Secondary | ICD-10-CM | POA: Diagnosis not present

## 2017-05-07 NOTE — Patient Instructions (Signed)
Straight Leg Raise    Tighten stomach and slowly raise locked leg from floor. Repeat _10___ times per set. Do ___1-2_ sets per session. Do _2___ sessions per day.  Bridge    Lie back, legs bent. Inhale, pressing hips up. Keeping ribs in, lengthen lower back. Exhale, rolling down along spine from top. Repeat __10__ times. Do __1-2__ sessions per day.  http://pm.exer.us/55   Copyright  VHI. All rights reserved.

## 2017-05-08 NOTE — Therapy (Signed)
[place], [place], [address] Phone: [phone]   Fax:  [phone]  Physical Therapy Treatment  Patient Details  Name: [name] MRN: [id number] Date of Birth: 05-03-1961 Referring Provider: Nicolette Bang, DO  Encounter Date: 05/07/2017      PT End of Session - 05/07/17 1141    Visit Number 3   Number of Visits 9   Date for PT Re-Evaluation 05/25/17   Authorization Type BCBS- 30 visit limit   PT Start Time 1100   PT Stop Time 1142   PT Time Calculation (min) 42 min      Past Medical History:  Diagnosis Date  . Allergic rhinitis   . COPD, mild ([place]) FOLLOWED BY DR Melvyn Novas  . GERD (gastroesophageal reflux disease)   . HTN (hypertension)   . Hypertension   . Iron deficiency anemia   . Long-term current use of steroids SYMBICORT INHALER  . 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 Sarcoidosis STABLE PER CXR JUNE 2013  . Shortness of breath   . Sickle cell trait [facility])     Past Surgical History:  Procedure Laterality Date  . ADJUSTABLE SUTURE MANIPULATION  05/22/2012   Procedure: ADJUSTABLE SUTURE MANIPULATION;  Surgeon: Dara Hoyer, MD;  Location[facility];  Service: Ophthalmology;  Laterality: Right;  . CESAREAN SECTION  1986   W/ BILATERAL TUBAL LIGATION  . EYE SURGERY    . MEDIAN RECTUS REPAIR  05/22/2012   Procedure: MEDIAN RECTUS REPAIR;  Surgeon: Dara Hoyer, MD;  Location[facility];  Service: Ophthalmology;  Laterality: Bilateral;  INFERIOR RECTUS RESECTION WITH ADJUSTIBLE SUTURES RIGHT EYE   . RADIOACTIVE SEED GUIDED MASTECTOMY WITH AXILLARY SENTINEL LYMPH NODE BIOPSY Right 08/18/2015   Procedure: RADIOACTIVE SEED GUIDED PARTIAL MASTECTOMY WITH AXILLARY SENTINEL LYMPH NODE BIOPSY;  Surgeon: Stark Klein, MD;  Location: [place];  Service: General;  Laterality: Right;  . TOTAL ROBOTIC ASSISTED LAPAROSCOPIC HYSTERECTOMY  12-30-2010   SYMPTOMATIC UTERINE FIBROIDS  . UPPER TEETH EXTRACTION'S  1992    There were no vitals filed for this visit.      Subjective Assessment - 05/07/17 1110    Subjective It was aching a little on saturday so I rested and iced. Donnald Garre been doing 10 minutes a day on the bike    Currently in Pain? No/denies                         [facility] Adult PT Treatment/Exercise - 05/07/17 0001      Knee/Hip Exercises: Aerobic   Nustep Aerobic L3 x 5 minutes    SP02 90%  and above      Knee/Hip Exercises: Standing   Knee Flexion 10 reps;2 sets   Knee Flexion Limitations 4#   Hip Abduction 10 reps   Abduction Limitations 4#   Hip Extension 10 reps;2 sets;Both   Extension Limitations 4#   Functional Squat Limitations mini squats 10 x 2      Knee/Hip Exercises: Seated   Marching Limitations 10x 2 each   Marching Weights 4 lbs.     Knee/Hip Exercises: Supine   Quad Sets 10 reps   Bridges with [employer] 10 reps   Straight Leg Raises 10 reps;Both   Straight Leg Raises Limitations cues to exhale with lift, neutral spine    Other Supine Knee/Hip Exercises clam with green band x 20  PT Education - 05/07/17 1141    Education provided Yes   Education Details HEP    Person(s) Educated Patient   Methods Explanation;Handout   Comprehension Verbalized understanding             PT Long Term Goals - 04/23/17 1128      PT LONG TERM GOAL #1   Title FOTO to 63% ability to indicate significant improvement in fucntional ability by 7/13   Baseline  46% at eval   Time 4   Period Weeks   Status New     PT LONG TERM GOAL #2   Title Pt will be able to ascend & descend stairs step over step with UE support   Baseline leads with the R at eval   Time 4   Period Weeks   Status New     PT LONG TERM GOAL #3   Title Pt will verbalize at least 75% improvement with knee "feeling funny" and "giving out"   Baseline feels this frequently at eval   Time 4   Period  Weeks   Status New     PT LONG TERM GOAL #4   Title Average pain <=2/10 to decrease pain effects on daily activities   Baseline 4/10 at rest at eval   Time 4   Period Weeks   Status New               Plan - 05/07/17 0741    Clinical Impression Statement Pt arrives after missing her last appointment. She has been doing mini squats at home and notes a little less knee pain. We worked on open and closed chain knee and hip strengthening while monitoring SP02%. Updated HEP. No increased pain during treatment.    PT Next Visit Plan nu step, hip strengthening, CKC mini squats   PT Home Exercise Plan hooklying clam greeen tband, ice 2/day 10-15 min, ride stationary bike 10 min/day, SLR, bridge    Consulted and Agree with Plan of Care Patient      Patient will benefit from skilled therapeutic intervention in order to improve the following deficits and impairments:  Abnormal gait, Difficulty walking, Decreased endurance, Decreased activity tolerance, Hypermobility, Pain, Improper body mechanics, Decreased balance, Decreased strength, Increased edema, Postural dysfunction  Visit Diagnosis: Acute pain of left knee  Difficulty in walking, not elsewhere classified  Muscle weakness (generalized)     Problem List Patient Active Problem List   Diagnosis Date Noted  . Knee pain 03/29/2017  . Right ankle pain 03/29/2017  . Morbid obesity due to excess calories ([place]) 01/10/2017  . Dyspnea on exertion 11/09/2016  . Chronic respiratory failure with hypoxia and hypercapnia ([place]) 10/29/2016  . Malnutrition of moderate degree 10/10/2016  . Acute on chronic respiratory failure ([place]) 10/09/2016  . Difficulty in walking, not elsewhere classified   . Depression with anxiety 08/07/2016  . Subacromial impingement of left shoulder 07/19/2016  . Overweight (BMI 25.0-29.9) 05/11/2016  . Hot flashes 02/01/2016  . Constipation 01/13/2016  . Breast cancer of upper-outer quadrant of right female  breast ([place]) 08/04/2015  . Tibial pain 06/03/2015  . Rotator cuff impingement syndrome 09/14/2014  . Menopausal syndrome (hot flashes) 01/19/2014  . GERD (gastroesophageal reflux disease) 12/21/2012  . Hyperlipidemia 12/21/2012  . SICKLE-CELL TRAIT 04/15/2008  . Sarcoidosis ([place]) 01/10/2007  . ANEMIA, IRON DEFICIENCY, UNSPEC. 01/10/2007  . HYPERTENSION, BENIGN SYSTEMIC 01/10/2007  . COPD  GOLD III 01/10/2007    Dorene Ar, PTA 05/08/2017, 7:44 AM  [place], [place], [address] Phone: [phone]   Fax:  [phone]  Name: [name] MRN: [id number] Date of Birth: Apr 08, 1961

## 2017-05-09 ENCOUNTER — Encounter: Payer: Self-pay | Admitting: Physical Therapy

## 2017-05-09 ENCOUNTER — Ambulatory Visit: Payer: BLUE CROSS/BLUE SHIELD | Admitting: Physical Therapy

## 2017-05-09 DIAGNOSIS — R262 Difficulty in walking, not elsewhere classified: Secondary | ICD-10-CM

## 2017-05-09 DIAGNOSIS — M6281 Muscle weakness (generalized): Secondary | ICD-10-CM | POA: Diagnosis not present

## 2017-05-09 DIAGNOSIS — M25562 Pain in left knee: Secondary | ICD-10-CM | POA: Diagnosis not present

## 2017-05-09 NOTE — Therapy (Signed)
[place], [place], [address] Phone: [phone]   Fax:  [phone]  Physical Therapy Treatment  Patient Details  Name: [name] MRN: [id number] Date of Birth: [birth year] Referring Provider: Nicolette Bang, DO  Encounter Date: 05/09/2017      PT End of Session - 05/09/17 1050    Visit Number 4   Number of Visits 9   Date for PT Re-Evaluation 05/25/17   Authorization Type BCBS- 30 visit limit   PT Start Time 1050   PT Stop Time 1135   PT Time Calculation (min) 45 min   Activity Tolerance Patient tolerated treatment well   Behavior During Therapy [facility] for tasks assessed/performed      Past Medical History:  Diagnosis Date  . Allergic rhinitis   . COPD, mild ([place]) FOLLOWED BY DR Melvyn Novas  . GERD (gastroesophageal reflux disease)   . HTN (hypertension)   . Hypertension   . Iron deficiency anemia   . Long-term current use of steroids SYMBICORT INHALER  . 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 Sarcoidosis STABLE PER CXR JUNE 2013  . Shortness of breath   . Sickle cell trait [facility])     Past Surgical History:  Procedure Laterality Date  . ADJUSTABLE SUTURE MANIPULATION  05/22/2012   Procedure: ADJUSTABLE SUTURE MANIPULATION;  Surgeon: Dara Hoyer, MD;  Location: [facility];  Service: Ophthalmology;  Laterality: Right;  . CESAREAN SECTION  1986   W/ BILATERAL TUBAL LIGATION  . EYE SURGERY    . MEDIAN RECTUS REPAIR  05/22/2012   Procedure: MEDIAN RECTUS REPAIR;  Surgeon: Dara Hoyer, MD;  Location: [facility];  Service: Ophthalmology;  Laterality: Bilateral;  INFERIOR RECTUS RESECTION WITH ADJUSTIBLE SUTURES RIGHT EYE   . RADIOACTIVE SEED GUIDED MASTECTOMY WITH AXILLARY SENTINEL LYMPH NODE BIOPSY Right 08/18/2015   Procedure: RADIOACTIVE SEED GUIDED PARTIAL MASTECTOMY WITH AXILLARY SENTINEL LYMPH NODE BIOPSY;  Surgeon: Stark Klein, MD;  Location: [place];  Service: General;  Laterality: Right;  . TOTAL ROBOTIC ASSISTED LAPAROSCOPIC HYSTERECTOMY  12-30-2010   SYMPTOMATIC UTERINE FIBROIDS  . UPPER TEETH EXTRACTION'S  1992    There were no vitals filed for this visit.      Subjective Assessment - 05/09/17 1050    Subjective Pt reports her knee is a little achey this morning, may have slept wrong. Felt like it was going to give out.    Patient Stated Goals decrease pain, walk more, no longer "feel funny" or give out   Currently in Pain? Yes   Pain Score 3    Pain Location Knee   Pain Orientation Left   Pain Descriptors / Indicators Aching                         [facility] Adult PT Treatment/Exercise - 05/09/17 0001      Exercises   Exercises Knee/Hip     Knee/Hip Exercises: Stretches   Passive Hamstring Stretch Limitations seated EOB with green strap   Hip Flexor Stretch Limitations thomas test position   Piriformis Stretch Limitations figure 4     Knee/Hip Exercises: Aerobic   Nustep 5 min L4     Knee/Hip Exercises: Machines for Strengthening   Total Gym Leg Press 20 lb x20     Knee/Hip Exercises: Standing   Heel Raises Limitations x10 neutral, x10 ext rot   SLS with mini squat 10s holds  Knee/Hip Exercises: Seated   Sit to Sand 10 reps  slow, eccentric lower     Knee/Hip Exercises: Supine   Short Arc Quad Sets 10 reps  5s holds   Short Arc Quad Sets Limitations 3lb   Straight Leg Raises 10 reps   Straight Leg Raises Limitations 3#   Straight Leg Raise with External Rotation 10 reps   Straight Leg Raise with External Rotation Limitations cues to keep hips flat on table                PT Education - 05/09/17 1053    Education provided Yes   Education Details exercise form/rationale, using full movement of rib cage for deep breathing rather than cervical musculature   Person(s) Educated Patient   Methods Explanation;Demonstration;Tactile cues;Verbal cues   Comprehension Verbalized  understanding;Returned demonstration;Verbal cues required;Tactile cues required;Need further instruction             PT Long Term Goals - 04/23/17 1128      PT LONG TERM GOAL #1   Title FOTO to 63% ability to indicate significant improvement in fucntional ability by 7/13   Baseline  46% at eval   Time 4   Period Weeks   Status New     PT LONG TERM GOAL #2   Title Pt will be able to ascend & descend stairs step over step with UE support   Baseline leads with the R at eval   Time 4   Period Weeks   Status New     PT LONG TERM GOAL #3   Title Pt will verbalize at least 75% improvement with knee "feeling funny" and "giving out"   Baseline feels this frequently at eval   Time 4   Period Weeks   Status New     PT LONG TERM GOAL #4   Title Average pain <=2/10 to decrease pain effects on daily activities   Baseline 4/10 at rest at eval   Time 4   Period Weeks   Status New               Plan - 05/09/17 1122    Clinical Impression Statement Pt tolerated exercises well, one incident required rested deep breathing to bring O2 back above 90%. Utilized table and UE support to challenge eccentric quadriceps strength.    PT Treatment/Interventions ADLs/Self Care Home Management;Cryotherapy;Electrical Stimulation;Iontophoresis 4mg /ml Dexamethasone;Functional mobility training;Stair training;Gait training;Ultrasound;Moist Heat;Therapeutic activities;Therapeutic exercise;Balance training;Neuromuscular re-education;Patient/family education;Passive range of motion;Manual techniques;Dry needling;Taping   PT Next Visit Plan CKC hip strengthening, side stepping, balance & uneven surfaces   PT Home Exercise Plan hooklying clam greeen tband, ice 2/day 10-15 min, ride stationary bike 10 min/day, SLR, bridge    Consulted and Agree with Plan of Care Patient      Patient will benefit from skilled therapeutic intervention in order to improve the following deficits and impairments:  Abnormal  gait, Difficulty walking, Decreased endurance, Decreased activity tolerance, Hypermobility, Pain, Improper body mechanics, Decreased balance, Decreased strength, Increased edema, Postural dysfunction  Visit Diagnosis: Acute pain of left knee  Difficulty in walking, not elsewhere classified  Muscle weakness (generalized)     Problem List Patient Active Problem List   Diagnosis Date Noted  . Knee pain 03/29/2017  . Right ankle pain 03/29/2017  . Morbid obesity due to excess calories ([place]) 01/10/2017  . Dyspnea on exertion 11/09/2016  . Chronic respiratory failure with hypoxia and hypercapnia ([place]) 10/29/2016  . Malnutrition of moderate degree 10/10/2016  . Acute  on chronic respiratory failure ([place]) 10/09/2016  . Difficulty in walking, not elsewhere classified   . Depression with anxiety 08/07/2016  . Subacromial impingement of left shoulder 07/19/2016  . Overweight (BMI 25.0-29.9) 05/11/2016  . Hot flashes 02/01/2016  . Constipation 01/13/2016  . Breast cancer of upper-outer quadrant of right female breast ([place]) 08/04/2015  . Tibial pain 06/03/2015  . Rotator cuff impingement syndrome 09/14/2014  . Menopausal syndrome (hot flashes) 01/19/2014  . GERD (gastroesophageal reflux disease) 12/21/2012  . Hyperlipidemia 12/21/2012  . SICKLE-CELL TRAIT 04/15/2008  . Sarcoidosis ([place]) 01/10/2007  . ANEMIA, IRON DEFICIENCY, UNSPEC. 01/10/2007  . HYPERTENSION, BENIGN SYSTEMIC 01/10/2007  . COPD  GOLD III 01/10/2007   Talvin Christianson C. Branston Halsted PT, DPT 05/09/17 11:40 AM   [facility] [address] [place], [place], [address] Phone: [phone]   Fax:  [phone]  Name: PRISILA DLOUHY MRN: [id number] Date of Birth: Mar 31, 1961

## 2017-05-10 ENCOUNTER — Other Ambulatory Visit: Payer: Self-pay | Admitting: *Deleted

## 2017-05-10 DIAGNOSIS — J449 Chronic obstructive pulmonary disease, unspecified: Secondary | ICD-10-CM | POA: Diagnosis not present

## 2017-05-10 MED ORDER — FLUTICASONE PROPIONATE 50 MCG/ACT NA SUSP: 2.0000 | Freq: Every day | NASAL | 11 refills | Status: DC | PRN

## 2017-05-14 ENCOUNTER — Encounter: Payer: BLUE CROSS/BLUE SHIELD | Admitting: Physical Therapy

## 2017-05-14 ENCOUNTER — Encounter: Payer: Self-pay | Admitting: Acute Care

## 2017-05-14 ENCOUNTER — Ambulatory Visit (INDEPENDENT_AMBULATORY_CARE_PROVIDER_SITE_OTHER): Payer: BLUE CROSS/BLUE SHIELD | Admitting: Acute Care

## 2017-05-14 ENCOUNTER — Ambulatory Visit: Payer: BLUE CROSS/BLUE SHIELD | Admitting: Internal Medicine

## 2017-05-14 VITALS — BP 130/78 | HR 76 | Ht 65.0 in | Wt 211.0 lb

## 2017-05-14 DIAGNOSIS — J449 Chronic obstructive pulmonary disease, unspecified: Secondary | ICD-10-CM | POA: Diagnosis not present

## 2017-05-14 DIAGNOSIS — J9611 Chronic respiratory failure with hypoxia: Secondary | ICD-10-CM | POA: Diagnosis not present

## 2017-05-14 DIAGNOSIS — D869 Sarcoidosis, unspecified: Secondary | ICD-10-CM

## 2017-05-14 MED ORDER — PREDNISONE 5 MG PO TABS: ORAL_TABLET | ORAL | 1 refills | Status: DC

## 2017-05-14 MED ORDER — BUDESONIDE-FORMOTEROL FUMARATE 160-4.5 MCG/ACT IN AERO: 2.0000 | INHALATION_SPRAY | Freq: Two times a day (BID) | RESPIRATORY_TRACT | 2 refills | Status: DC

## 2017-05-14 NOTE — Assessment & Plan Note (Signed)
Continue wearing oxygen at 1 L with rest and 2 L with exertion. Please contact office for sooner follow up if symptoms do not improve or worsen or seek emergency care

## 2017-05-14 NOTE — Progress Notes (Signed)
Chart and office note reviewed in detail  > agree with a/p as outlined    

## 2017-05-14 NOTE — Assessment & Plan Note (Signed)
Stable interval: Concerned about weight gain on prednisone. Plan: We will decrease your prednisone to 2.5 mg daily. We will prescribe 5 mg tablets. We will renew your prescription for Symbicort.  2 puffs twice daily. Rinse mouth after use. Follow up appointment with Dr. Melvyn Novas in 2 months. We will refer you to Pulmonary rehab. ( Dr. Melvyn Novas) Referral to a dietician for weight loss. Please contact office for sooner follow up if symptoms do not improve or worsen or seek emergency care

## 2017-05-14 NOTE — Progress Notes (Signed)
History of Present Illness [name] is a 56 y.o. female former smoker ( Quit 2005) with sarcoidosis. She was restarted on daily steroids 09/2016 for respiratory failure.She is followed by Dr. Melvyn Novas.   05/14/2017 $ Month Follow Up: Pt. Presents for follow up. She states she is doing well. She states she has gained 40 pounds since starting on her prednisone. She is down to 5 mg daily.She states her breathing is at baseline. She is using her oxygen 24/7. She states she does turn it off at times when she is watching TV and non-exertional. She monitors her saturations.She states she is using 1 L at rest and 2 L with exertion.She states she has no cough at all. She denies fever, chest pain, orthopnea . She is compliant with her prednisone and her Bevespie. She is transitioning to Symbicort for insurance reasons once she runs out of the [place].She has an eye appointment this month.  Test Results:   CBC Latest Ref Rng & Units 03/16/2017 11/09/2016 10/07/2016  WBC 4.0 - 10.5 K/uL 7.5 12.8(H) 8.1  Hemoglobin 12.0 - 15.0 g/dL 11.6(L) 11.9(L) 11.0(L)  Hematocrit 36.0 - 46.0 % 37.2 35.8(L) 34.3(L)  Platelets 150 - 400 K/uL 294 362.0 350    BMP Latest Ref Rng & Units 03/16/2017 03/06/2017 02/05/2017  Glucose 65 - 99 mg/dL 117(H) - 92  BUN 6 - 20 mg/dL 13 - 13  Creatinine 0.44 - 1.00 mg/dL 0.94 - 0.82  BUN/Creat Ratio 9 - 23 - - 16  Sodium 135 - 145 mmol/L 142 - 145(H)  Potassium 3.5 - 5.1 mmol/L 4.1 - 4.5  Chloride 101 - 111 mmol/L 102 - 96  CO2 22 - 32 mmol/L 34(H) - 31(H)  Calcium 8.9 - 10.3 mg/dL 9.5 9.8 10.9(H)    BNP    Component Value Date/Time   BNP 52.1 10/06/2016 1447    ProBNP    Component Value Date/Time   PROBNP 40.0 11/09/2016 1243    PFT    Component Value Date/Time   FEV1PRE 0.87 10/10/2016 0925   FVCPRE 2.12 10/10/2016 0925   DLCOUNC 9.39 10/10/2016 0925   PREFEV1FVCRT 41 10/10/2016 0925    Dg Ankle Complete Right  Result Date: 04/17/2017 CLINICAL DATA:  Right  ankle edema. EXAM: RIGHT ANKLE - COMPLETE 3+ VIEW COMPARISON:  No prior . FINDINGS: Mild soft tissue swelling. No acute bony or joint abnormality identified. IMPRESSION: No acute bony abnormality Electronically Signed   By: Marcello Moores  Register   On: 04/17/2017 10:37     Past medical hx Past Medical History:  Diagnosis Date  . Allergic rhinitis   . COPD, mild ([place]) FOLLOWED BY DR Melvyn Novas  . GERD (gastroesophageal reflux disease)   . HTN (hypertension)   . Hypertension   . Iron deficiency anemia   . Long-term current use of steroids SYMBICORT INHALER  . 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 Sarcoidosis STABLE PER CXR JUNE 2013  . Shortness of breath   . Sickle cell trait [facility])      Social History  Substance Use Topics  . Smoking status: Former Smoker    Packs/day: 1.00    Years: 20.00    Quit date: 11/14/2003  . Smokeless tobacco: Never Used  . Alcohol use No    Tobacco Cessation: Counseling given: Not Answered   Past surgical hx, Family hx, Social hx all reviewed.  Current Outpatient Prescriptions on File Prior to Visit  Medication Sig  . acetaminophen (TYLENOL) 500 MG tablet Take 1,000 mg  by mouth every 6 (six) hours as needed for fever. Reported on 05/10/2016  . albuterol (PROVENTIL HFA;VENTOLIN HFA) 108 (90 Base) MCG/ACT inhaler Inhale 2 puffs into the lungs every 6 (six) hours as needed for wheezing or shortness of breath.  Marland Kitchen albuterol (VENTOLIN HFA) 108 (90 Base) MCG/ACT inhaler Inhale 2 puffs into the lungs every 6 (six) hours as needed for wheezing or shortness of breath.  . anastrozole (ARIMIDEX) 1 MG tablet Take 1 tablet (1 mg total) by mouth at bedtime.  Marland Kitchen atorvastatin (LIPITOR) 20 MG tablet Take 1 tablet (20 mg total) by mouth daily.  . diphenhydrAMINE (BENADRYL) 25 MG tablet Take 1 tablet (25 mg total) by mouth every 6 (six) hours as needed.  . fluticasone (FLONASE) 50 MCG/ACT nasal spray Place 2 sprays into both nostrils daily as needed for allergies.  Marland Kitchen gabapentin (NEURONTIN) 300 MG  capsule Take 1 capsule (300 mg total) by mouth at bedtime.  . Glycopyrrolate-Formoterol (BEVESPI AEROSPHERE) 9-4.8 MCG/ACT AERO Inhale 2 puffs into the lungs 2 (two) times daily.  Marland Kitchen ibuprofen (ADVIL,MOTRIN) 600 MG tablet Take 1 tablet (600 mg total) by mouth every 8 (eight) hours as needed.  . metoprolol tartrate (LOPRESSOR) 25 MG tablet Take 1 tablet (25 mg total) by mouth 2 (two) times daily.  . Multiple Vitamin (MULTIVITAMIN) capsule Take 1 capsule by mouth daily.  Marland Kitchen omeprazole (PRILOSEC) 20 MG capsule TAKE ONE CAPSULE BY MOUTH  DAILY  . oxymetazoline (AFRIN NASAL SPRAY) 0.05 % nasal spray Place 1 spray into both nostrils 2 (two) times daily.  . predniSONE (DELTASONE) 10 MG tablet Take 10 mg by mouth daily with breakfast.  . triamterene-hydrochlorothiazide (MAXZIDE-25) 37.5-25 MG tablet TAKE ONE TABLET BY MOUTH ONCE DAILY  . doxycycline (VIBRAMYCIN) 100 MG capsule Take 1 capsule (100 mg total) by mouth 2 (two) times daily. (Patient not taking: Reported on 05/14/2017)   No current facility-administered medications on file prior to visit.      Allergies  Allergen Reactions  . Codeine Other (See Comments)    Avoids-felt bad when took before    Review Of Systems:  Constitutional:   No  weight loss, night sweats,  Fevers, chills, fatigue, or  lassitude.  HEENT:   No headaches,  Difficulty swallowing,  Tooth/dental problems, or  Sore throat,                No sneezing, itching, ear ache, nasal congestion, post nasal drip,   CV:  No chest pain,  Orthopnea, PND, swelling in lower extremities, anasarca, dizziness, palpitations, syncope.   GI  No heartburn, indigestion, abdominal pain, nausea, vomiting, diarrhea, change in bowel habits, loss of appetite, bloody stools.   Resp: No shortness of breath with exertion or at rest.  No excess mucus, no productive cough,  No non-productive cough,  No coughing up of blood.  No change in color of mucus.  No wheezing.  No chest wall deformity  Skin: no  rash or lesions.  GU: no dysuria, change in color of urine, no urgency or frequency.  No flank pain, no hematuria   MS:  No joint pain or swelling.  No decreased range of motion.  No back pain.  Psych:  No change in mood or affect. No depression or anxiety.  No memory loss.   Vital Signs BP 130/78 (BP Location: Left Arm, Cuff Size: Normal)   Pulse 76   Ht 5\' 5"  (1.651 m)   Wt 211 lb (95.7 kg)   SpO2 92%  BMI 35.11 kg/m    Physical Exam:  General- No distress,  A&Ox3, appropriate ENT: No sinus tenderness, TM clear, pale nasal mucosa, no oral exudate,no post nasal drip, no LAN Cardiac: S1, S2, regular rate and rhythm, no murmur Chest: No wheeze/ rales/ dullness; no accessory muscle use, no nasal flaring, no sternal retractions, coarse throughout. Abd.: Soft Non-tender, obese Ext: No clubbing cyanosis, edema Neuro:  normal strength Skin: No rashes, warm and dry Psych: normal mood and behavior, appropriate   Assessment/Plan  COPD  GOLD III Stable interval: Concerned about weight gain on prednisone. Plan: We will decrease your prednisone to 2.5 mg daily. We will prescribe 5 mg tablets. We will renew your prescription for Symbicort.  2 puffs twice daily. Rinse mouth after use. Follow up appointment with Dr. Melvyn Novas in 2 months. We will refer you to Pulmonary rehab. ( Dr. Melvyn Novas) Referral to a dietician for weight loss. Please contact office for sooner follow up if symptoms do not improve or worsen or seek emergency care    Chronic respiratory failure with hypoxia and hypercapnia (HCC) Continue wearing oxygen at 1 L with rest and 2 L with exertion. Please contact office for sooner follow up if symptoms do not improve or worsen or seek emergency care     Magdalen Spatz, NP 05/14/2017  11:27 AM

## 2017-05-14 NOTE — Patient Instructions (Signed)
It is good to see you today. We will decrease your prednisone to 2.5 mg daily. We will prescribe 5 mg tablets. We will renew your prescription for Symbicort.  2 puffs twice daily. Rinse mouth after use. Follow up appointment with Dr. Melvyn Novas in 2 months. We will refer you to Pulmonary rehab. ( Dr. Melvyn Novas) Referral to a dietician for weight loss. Please contact office for sooner follow up if symptoms do not improve or worsen or seek emergency care

## 2017-05-14 NOTE — Addendum Note (Signed)
Addended by: Jannette Spanner on: 05/14/2017 11:37 AM   Modules accepted: Orders

## 2017-05-15 ENCOUNTER — Ambulatory Visit: Payer: BLUE CROSS/BLUE SHIELD | Admitting: Physical Therapy

## 2017-05-17 ENCOUNTER — Encounter: Payer: Self-pay | Admitting: Physical Therapy

## 2017-05-17 ENCOUNTER — Ambulatory Visit: Payer: BLUE CROSS/BLUE SHIELD | Attending: Nurse Practitioner | Admitting: Physical Therapy

## 2017-05-17 DIAGNOSIS — M6281 Muscle weakness (generalized): Secondary | ICD-10-CM | POA: Insufficient documentation

## 2017-05-17 DIAGNOSIS — R262 Difficulty in walking, not elsewhere classified: Secondary | ICD-10-CM | POA: Diagnosis not present

## 2017-05-17 DIAGNOSIS — M25562 Pain in left knee: Secondary | ICD-10-CM | POA: Diagnosis not present

## 2017-05-17 NOTE — Therapy (Signed)
[place], [place], [address] Phone: [phone]   Fax:  [phone]  Physical Therapy Treatment  Patient Details  Name: [name] MRN: [id number] Date of Birth: [birth year] Referring Provider: Nicolette Bang, DO  Encounter Date: 05/17/2017      PT End of Session - 05/17/17 1327    Visit Number 5   Number of Visits 9   Date for PT Re-Evaluation 05/25/17   Authorization Type BCBS- 30 visit limit   PT Start Time 1331   PT Stop Time 1412   PT Time Calculation (min) 41 min   Activity Tolerance Patient tolerated treatment well   Behavior During Therapy [facility] for tasks assessed/performed      Past Medical History:  Diagnosis Date  . Allergic rhinitis   . COPD, mild ([place]) FOLLOWED BY DR Melvyn Novas  . GERD (gastroesophageal reflux disease)   . HTN (hypertension)   . Hypertension   . Iron deficiency anemia   . Long-term current use of steroids SYMBICORT INHALER  . 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 Sarcoidosis STABLE PER CXR JUNE 2013  . Shortness of breath   . Sickle cell trait [facility])     Past Surgical History:  Procedure Laterality Date  . ADJUSTABLE SUTURE MANIPULATION  05/22/2012   Procedure: ADJUSTABLE SUTURE MANIPULATION;  Surgeon: Dara Hoyer, MD;  Location: [facility];  Service: Ophthalmology;  Laterality: Right;  . CESAREAN SECTION  1986   W/ BILATERAL TUBAL LIGATION  . EYE SURGERY    . MEDIAN RECTUS REPAIR  05/22/2012   Procedure: MEDIAN RECTUS REPAIR;  Surgeon: Dara Hoyer, MD;  Location: [facility];  Service: Ophthalmology;  Laterality: Bilateral;  INFERIOR RECTUS RESECTION WITH ADJUSTIBLE SUTURES RIGHT EYE   . RADIOACTIVE SEED GUIDED MASTECTOMY WITH AXILLARY SENTINEL LYMPH NODE BIOPSY Right 08/18/2015   Procedure: RADIOACTIVE SEED GUIDED PARTIAL MASTECTOMY WITH AXILLARY SENTINEL LYMPH NODE BIOPSY;  Surgeon: Stark Klein, MD;  Location: [place];  Service: General;  Laterality: Right;  . TOTAL ROBOTIC ASSISTED LAPAROSCOPIC HYSTERECTOMY  12-30-2010   SYMPTOMATIC UTERINE FIBROIDS  . UPPER TEETH EXTRACTION'S  1992    There were no vitals filed for this visit.      Subjective Assessment - 05/17/17 1331    Subjective Stepped off her bed yesterday and twisted into a valgus collapse- has had medial knee pain since then. Reports compliance with HEP. Feels like her knee is going to give out on her going up the steps to her apartment.    Patient Stated Goals decrease pain, walk more, no longer "feel funny" or give out   Currently in Pain? Yes   Pain Score 3    Pain Location Knee   Pain Orientation Left;Medial   Pain Descriptors / Indicators Nagging   Aggravating Factors  stairs   Pain Relieving Factors rest, bike                         [facility] Adult PT Treatment/Exercise - 05/17/17 0001      Knee/Hip Exercises: Stretches   Passive Hamstring Stretch 2 reps;30 seconds;Both   Passive Hamstring Stretch Limitations seated EOB with green strap     Knee/Hip Exercises: Aerobic   Nustep 5 min L4     Knee/Hip Exercises: Standing   Heel Raises 20 reps   Heel Raises Limitations red tband around knees, no UE support   Forward Step Up Limitations  6 in step up, bilat UE support   SLS mini squats& glut set to stand, bilat     Knee/Hip Exercises: Seated   Sit to Sand 10 reps  red tband at knees, quick stand/slow sit     Knee/Hip Exercises: Sidelying   Clams x30 each red tband                     PT Long Term Goals - 04/23/17 1128      PT LONG TERM GOAL #1   Title FOTO to 63% ability to indicate significant improvement in fucntional ability by 7/13   Baseline  46% at eval   Time 4   Period Weeks   Status New     PT LONG TERM GOAL #2   Title Pt will be able to ascend & descend stairs step over step with UE support   Baseline leads with the R at eval   Time 4   Period Weeks   Status New      PT LONG TERM GOAL #3   Title Pt will verbalize at least 75% improvement with knee "feeling funny" and "giving out"   Baseline feels this frequently at eval   Time 4   Period Weeks   Status New     PT LONG TERM GOAL #4   Title Average pain <=2/10 to decrease pain effects on daily activities   Baseline 4/10 at rest at eval   Time 4   Period Weeks   Status New               Plan - 05/17/17 1348    Clinical Impression Statement Frequent giving out into valgus position indicates continued weakness in hip abductor musculature, added exercises to target abductor group today. Pt tolerated exercises well. Monitored rest breaks to increase O2 sat.    PT Treatment/Interventions ADLs/Self Care Home Management;Cryotherapy;Electrical Stimulation;Iontophoresis 4mg /ml Dexamethasone;Functional mobility training;Stair training;Gait training;Ultrasound;Moist Heat;Therapeutic activities;Therapeutic exercise;Balance training;Neuromuscular re-education;Patient/family education;Passive range of motion;Manual techniques;Dry needling;Taping   PT Next Visit Plan CK[facility] hip strengthening, side stepping, balance & uneven surfaces   PT Home Exercise Plan hooklying clam greeen tband, ice 2/day 10-15 min, ride stationary bike 10 min/day, SLR, bridge; sidelying clam red tband, LAQ.    Consulted and Agree with Plan of Care Patient      Patient will benefit from skilled therapeutic intervention in order to improve the following deficits and impairments:  Abnormal gait, Difficulty walking, Decreased endurance, Decreased activity tolerance, Hypermobility, Pain, Improper body mechanics, Decreased balance, Decreased strength, Increased edema, Postural dysfunction  Visit Diagnosis: Acute pain of left knee  Difficulty in walking, not elsewhere classified  Muscle weakness (generalized)     Problem List Patient Active Problem List   Diagnosis Date Noted  . Knee pain 03/29/2017  . Right ankle pain 03/29/2017  .  Morbid obesity due to excess calories ([place]) 01/10/2017  . Dyspnea on exertion 11/09/2016  . Chronic respiratory failure with hypoxia and hypercapnia ([place]) 10/29/2016  . Malnutrition of moderate degree 10/10/2016  . Acute on chronic respiratory failure ([place]) 10/09/2016  . Difficulty in walking, not elsewhere classified   . Depression with anxiety 08/07/2016  . Subacromial impingement of left shoulder 07/19/2016  . Overweight (BMI 25.0-29.9) 05/11/2016  . Hot flashes 02/01/2016  . Constipation 01/13/2016  . Breast cancer of upper-outer quadrant of right female breast ([place]) 08/04/2015  . Tibial pain 06/03/2015  . Rotator cuff impingement syndrome 09/14/2014  . Menopausal syndrome (hot flashes) 01/19/2014  .  GERD (gastroesophageal reflux disease) 12/21/2012  . Hyperlipidemia 12/21/2012  . SICKLE-CELL TRAIT 04/15/2008  . Sarcoidosis ([place]) 01/10/2007  . ANEMIA, IRON DEFICIENCY, UNSPEC. 01/10/2007  . HYPERTENSION, BENIGN SYSTEMIC 01/10/2007  . COPD  GOLD III 01/10/2007    Ernisha Sorn C. Akeel Reffner PT, DPT 05/17/17 2:19 PM   [facility] [address] [place], [place], [address] Phone: [phone]   Fax:  [phone]  Name: [name] MRN: [id number] Date of Birth: 1960/12/02

## 2017-05-21 ENCOUNTER — Telehealth: Payer: Self-pay | Admitting: Internal Medicine

## 2017-05-21 ENCOUNTER — Ambulatory Visit: Payer: BLUE CROSS/BLUE SHIELD | Admitting: Physical Therapy

## 2017-05-21 NOTE — Telephone Encounter (Signed)
Fine with me

## 2017-05-21 NOTE — Telephone Encounter (Signed)
Spoke with patient. She is requesting to have a 75mw in order to get an [employer] machine. Patient stated that she is already on a POC with [place] but [employer] stated that she will need a new one for them.   MW, is it ok for [place] to schedule patient for a 84mw? Please advise. Thanks!

## 2017-05-21 NOTE — Telephone Encounter (Signed)
Spoke with pt and informed her of MW's message. She wanted the first available appt,which has been made. She had no further questions at this time. Nothing further is needed.

## 2017-05-22 ENCOUNTER — Ambulatory Visit (INDEPENDENT_AMBULATORY_CARE_PROVIDER_SITE_OTHER): Payer: BLUE CROSS/BLUE SHIELD | Admitting: *Deleted

## 2017-05-22 ENCOUNTER — Other Ambulatory Visit (HOSPITAL_COMMUNITY): Payer: Self-pay

## 2017-05-22 ENCOUNTER — Ambulatory Visit: Payer: BLUE CROSS/BLUE SHIELD | Admitting: Internal Medicine

## 2017-05-22 DIAGNOSIS — R0609 Other forms of dyspnea: Secondary | ICD-10-CM | POA: Diagnosis not present

## 2017-05-22 DIAGNOSIS — D869 Sarcoidosis, unspecified: Secondary | ICD-10-CM

## 2017-05-22 DIAGNOSIS — R0600 Dyspnea, unspecified: Secondary | ICD-10-CM

## 2017-05-22 NOTE — Progress Notes (Signed)
Ambulatory walk for oxygen qualification.

## 2017-05-23 ENCOUNTER — Telehealth: Payer: Self-pay | Admitting: Physical Therapy

## 2017-05-23 ENCOUNTER — Ambulatory Visit: Payer: BLUE CROSS/BLUE SHIELD | Admitting: Physical Therapy

## 2017-05-23 NOTE — Telephone Encounter (Signed)
Spoke with pt who states she was unable to make her appointments this week due to portable oxygen machine being broken. Is having the company come to resolve the issue and would like to return when she is able to leave her home. Oleg Oleson C. [place] PT, DPT 05/23/17 11:30 AM

## 2017-05-24 DIAGNOSIS — J9611 Chronic respiratory failure with hypoxia: Secondary | ICD-10-CM | POA: Diagnosis not present

## 2017-05-24 DIAGNOSIS — J449 Chronic obstructive pulmonary disease, unspecified: Secondary | ICD-10-CM | POA: Diagnosis not present

## 2017-06-04 ENCOUNTER — Other Ambulatory Visit: Payer: Self-pay | Admitting: *Deleted

## 2017-06-04 MED ORDER — METOPROLOL TARTRATE 25 MG PO TABS: 25.0000 mg | ORAL_TABLET | Freq: Two times a day (BID) | ORAL | 11 refills | Status: DC

## 2017-06-05 ENCOUNTER — Ambulatory Visit: Payer: BLUE CROSS/BLUE SHIELD | Admitting: Dietician

## 2017-06-06 ENCOUNTER — Encounter: Payer: Self-pay | Admitting: Physical Therapy

## 2017-06-06 ENCOUNTER — Ambulatory Visit: Payer: BLUE CROSS/BLUE SHIELD | Admitting: Physical Therapy

## 2017-06-06 DIAGNOSIS — M25562 Pain in left knee: Secondary | ICD-10-CM | POA: Diagnosis not present

## 2017-06-06 DIAGNOSIS — R262 Difficulty in walking, not elsewhere classified: Secondary | ICD-10-CM | POA: Diagnosis not present

## 2017-06-06 DIAGNOSIS — M6281 Muscle weakness (generalized): Secondary | ICD-10-CM | POA: Diagnosis not present

## 2017-06-06 NOTE — Therapy (Signed)
[place], [place], [address] Phone: [phone]   Fax:  [phone]  Physical Therapy Treatment/Discharge Summary  Patient Details  Name: [name] MRN: [id number] Date of Birth: 1960-12-22 Referring Provider: Nicolette Bang, DO  Encounter Date: 06/06/2017      PT End of Session - 06/06/17 0849    Visit Number 6   Number of Visits 9   Authorization Type [employer]- 30 visit limit   PT Start Time 0849   PT Stop Time 0917   PT Time Calculation (min) 28 min   Activity Tolerance Patient tolerated treatment well   Behavior During Therapy [facility] for tasks assessed/performed      Past Medical History:  Diagnosis Date  . Allergic rhinitis   . COPD, mild ([place]) FOLLOWED BY DR Melvyn Novas  . GERD (gastroesophageal reflux disease)   . HTN (hypertension)   . Hypertension   . Iron deficiency anemia   . Long-term current use of steroids SYMBICORT INHALER  . 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 Sarcoidosis STABLE PER CXR JUNE 2013  . Shortness of breath   . Sickle cell trait [facility])     Past Surgical History:  Procedure Laterality Date  . ADJUSTABLE SUTURE MANIPULATION  05/22/2012   Procedure: ADJUSTABLE SUTURE MANIPULATION;  Surgeon: Dara Hoyer, MD;  Location: [facility];  Service: Ophthalmology;  Laterality: Right;  . CESAREAN SECTION  1986   W/ BILATERAL TUBAL LIGATION  . EYE SURGERY    . MEDIAN RECTUS REPAIR  05/22/2012   Procedure: MEDIAN RECTUS REPAIR;  Surgeon: Dara Hoyer, MD;  Location[facility];  Service: Ophthalmology;  Laterality: Bilateral;  INFERIOR RECTUS RESECTION WITH ADJUSTIBLE SUTURES RIGHT EYE   . RADIOACTIVE SEED GUIDED MASTECTOMY WITH AXILLARY SENTINEL LYMPH NODE BIOPSY Right 08/18/2015   Procedure: RADIOACTIVE SEED GUIDED PARTIAL MASTECTOMY WITH AXILLARY SENTINEL LYMPH NODE BIOPSY;  Surgeon: Stark Klein, MD;  Location: [place];  Service:  General;  Laterality: Right;  . TOTAL ROBOTIC ASSISTED LAPAROSCOPIC HYSTERECTOMY  12-30-2010   SYMPTOMATIC UTERINE FIBROIDS  . UPPER TEETH EXTRACTION'S  1992    There were no vitals filed for this visit.      Subjective Assessment - 06/06/17 0851    Subjective Knee has been doing well, doing HEP. Almost 100% better. Begins pulmonary rehab in 2 weeks.   How long can you stand comfortably? a long time   How long can you walk comfortably? not limited by knee pain, limited by breathing difficulty   Patient Stated Goals decrease pain, walk more, no longer "feel funny" or give out   Currently in Pain? No/denies            [facility] PT Assessment - 06/06/17 0001      Assessment   Medical Diagnosis actue pain L knee   Referring Provider Nicolette Bang, DO     Observation/Other Assessments   Focus on Therapeutic Outcomes (FOTO)  53% ability     Strength   Right Hip Flexion 5/5   Right Hip Extension 5/5   Right Hip ABduction 4+/5   Left Hip Flexion 5/5   Left Hip Extension 4/5   Left Hip ABduction 4+/5   Left Knee Flexion 5/5     Palpation   Palpation comment denies TTP  PT Education - 06/06/17 [phone]    Education provided Yes   Education Details discussion of HEP, importance of continued stretching & strengthening, incorporating stretches/strength exercises into pulm rehab, options for adding weights to exercises, progress toward goals & strength increases   Person(s) Educated Patient   Methods Explanation   Comprehension Verbalized understanding             PT Long Term Goals - 06/06/17 8588      PT LONG TERM GOAL #1   Title FOTO to 63% ability to indicate significant improvement in fucntional ability by 7/13   Baseline 57% ability, improved from 46% at eval  (reports more limitation by breathing than knee)   Status Partially Met     PT LONG TERM GOAL #2   Title Pt will be able to ascend & descend stairs  step over step with UE support   Baseline able without pain   Status Achieved     PT LONG TERM GOAL #3   Title Pt will verbalize at least 75% improvement with knee "feeling funny" and "giving out"   Baseline almost 100% better   Status Achieved     PT LONG TERM GOAL #4   Title Average pain <=2/10 to decrease pain effects on daily activities   Baseline 0/10   Status Achieved               Plan - 06/06/17 0920    Clinical Impression Statement Pt has made significant progress since beginning PT. Denies pain in knee over the time she was unable to attend due to oxygen equipment failure because she has been doing her exercises and riding her bike. Pt verbalized comfort and understanding with long term HEP and was instructed to contact us with any further questions or concerns.    PT Treatment/Interventions ADLs/Self Care Home Management;Cryotherapy;Electrical Stimulation;Iontophoresis 55m/ml Dexamethasone;Functional mobility training;Stair training;Gait training;Ultrasound;Moist Heat;Therapeutic activities;Therapeutic exercise;Balance training;Neuromuscular re-education;Patient/family education;Passive range of motion;Manual techniques;Dry needling;Taping   Consulted and Agree with Plan of Care Patient      Patient will benefit from skilled therapeutic intervention in order to improve the following deficits and impairments:  Abnormal gait, Difficulty walking, Decreased endurance, Decreased activity tolerance, Hypermobility, Pain, Improper body mechanics, Decreased balance, Decreased strength, Increased edema, Postural dysfunction  Visit Diagnosis: Acute pain of left knee - Plan: PT plan of care cert/re-cert  Difficulty in walking, not elsewhere classified - Plan: PT plan of care cert/re-cert  Muscle weakness (generalized) - Plan: PT plan of care cert/re-cert     Problem List Patient Active Problem List   Diagnosis Date Noted  . Knee pain 03/29/2017  . Right ankle pain  03/29/2017  . Morbid obesity due to excess calories ([place] 01/10/2017  . Dyspnea on exertion 11/09/2016  . Chronic respiratory failure with hypoxia and hypercapnia ([place] 10/29/2016  . Malnutrition of moderate degree 10/10/2016  . Acute on chronic respiratory failure ([place] 10/09/2016  . Difficulty in walking, not elsewhere classified   . Depression with anxiety 08/07/2016  . Subacromial impingement of left shoulder 07/19/2016  . Overweight (BMI 25.0-29.9) 05/11/2016  . Hot flashes 02/01/2016  . Constipation 01/13/2016  . Breast cancer of upper-outer quadrant of right female breast ([place] 08/04/2015  . Tibial pain 06/03/2015  . Rotator cuff impingement syndrome 09/14/2014  . Menopausal syndrome (hot flashes) 01/19/2014  . GERD (gastroesophageal reflux disease) 12/21/2012  . Hyperlipidemia 12/21/2012  . SICKLE-CELL TRAIT 04/15/2008  . Sarcoidosis ([place] 01/10/2007  . ANEMIA, IRON DEFICIENCY, UNSPEC. 01/10/2007  .  HYPERTENSION, BENIGN SYSTEMIC 01/10/2007  . COPD  GOLD III 01/10/2007    PHYSICAL THERAPY DISCHARGE SUMMARY  Visits from Start of Care: 6  Current functional level related to goals / functional outcomes: See above   Remaining deficits: See above   Education / Equipment: Anatomy of condition, POC, HEP, exercise form/[place]  Plan: Patient agrees to discharge.  Patient goals were partially met. Patient is being discharged due to meeting the stated rehab goals.  ?????    Haneen Bernales C. Matteus Mcnelly PT, DPT 06/06/17 9:25 AM    [facility] [address] [place], [place], [address] Phone: [phone]   Fax:  [phone]  Name: ELANIA CROWL MRN: [id number] Date of Birth: 03-Jan-1961

## 2017-06-09 DIAGNOSIS — J449 Chronic obstructive pulmonary disease, unspecified: Secondary | ICD-10-CM | POA: Diagnosis not present

## 2017-06-11 ENCOUNTER — Ambulatory Visit (HOSPITAL_COMMUNITY): Payer: BLUE CROSS/BLUE SHIELD

## 2017-06-18 ENCOUNTER — Encounter (HOSPITAL_COMMUNITY)
Admission: RE | Admit: 2017-06-18 | Discharge: 2017-06-18 | Disposition: A | Discharge status: Home / Self Care | Payer: BLUE CROSS/BLUE SHIELD | Source: Ambulatory Visit | Attending: Internal Medicine | Admitting: Internal Medicine

## 2017-06-18 ENCOUNTER — Encounter (HOSPITAL_COMMUNITY): Payer: Self-pay

## 2017-06-18 VITALS — BP 111/75 | HR 81 | Resp 18 | Ht 65.0 in | Wt 211.0 lb

## 2017-06-18 DIAGNOSIS — D860 Sarcoidosis of lung: Secondary | ICD-10-CM

## 2017-06-18 DIAGNOSIS — R0609 Other forms of dyspnea: Secondary | ICD-10-CM | POA: Insufficient documentation

## 2017-06-18 NOTE — Progress Notes (Addendum)
[name] 56 y.o. female Pulmonary Rehab Orientation Note Patient arrived today in Cardiac and Pulmonary Rehab for orientation to Pulmonary Rehab. She was transported from [employer] via wheel chair. She does carry portable oxygen. Per pt, she uses oxygen continuously. Color good, skin warm and dry. Patient is oriented to time and place. Patient's medical history, psychosocial health, and medications reviewed. Psychosocial assessment reveals pt lives in an appartment and her adult daughter and 2 grandsons live with her. They will be moving out by next month.. Pt is currently on long term medical leave from her job. 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 She enjoys her work and is really hoping she will be able to return some day. She is in the process of applying for permanent disability although she is not happy about doing so. Pt hobbies include watching TV. Pt reports her stress level is low. Areas of stress/anxiety include Health and her inability to drive related to the lack of a vehicle. She had to turn her car in when she went out on disability because she could not afford the payments. She has to rely on her family to take her places. She states they are more than willing but not always available unless it is an MD appointment.  Pt does not exhibit signs of depression. She does not sleep well and feels she is sleep deprived related to constant nighttime hot flashes. Her MD is titrating medication for this hoping to provide some relief. PHQ2/9 score 0/0. Pt shows good  coping skills with positive outlook. She is offered emotional support and reassurance. Will continue to monitor and evaluate progress toward psychosocial goal(s) of maintaining a sound mind as she continues her sleep depravation cycle. Physical assessment reveals heart rate is normal, breath sounds clear to auscultation, no wheezes, rales, or rhonchi, diminished. Grip strength equal, strong. Distal pulses palpable. Patient reports  she does take medications as prescribed. Patient states she follows a Regular diet. The patient reports no specific efforts to gain or lose weight. Patient's weight will be monitored closely. Demonstration and practice of PLB using pulse oximeter. Patient able to return demonstration satisfactorily. Safety and hand hygiene in the exercise area reviewed with patient. Patient voices understanding of the information reviewed. Department expectations discussed with patient and achievable goals were set. The patient shows enthusiasm about attending the program and we look forward to working with this nice lady. The patient is scheduled for a 6 min walk test on 06/21/17 and to begin exercise on 06/28/17 at 1030.   45 minutes was spent on a variety of activities such as assessment of the patient, obtaining baseline data including height, weight, BMI, and grip strength, verifying medical history, allergies, and current medications, and teaching patient strategies for performing tasks with less respiratory effort with emphasis on pursed lip breathing.

## 2017-06-21 ENCOUNTER — Telehealth (HOSPITAL_COMMUNITY): Payer: Self-pay | Admitting: Internal Medicine

## 2017-06-21 ENCOUNTER — Ambulatory Visit (HOSPITAL_COMMUNITY): Payer: BLUE CROSS/BLUE SHIELD

## 2017-06-21 ENCOUNTER — Encounter (HOSPITAL_COMMUNITY): Payer: Self-pay | Admitting: *Deleted

## 2017-06-24 DIAGNOSIS — J9611 Chronic respiratory failure with hypoxia: Secondary | ICD-10-CM | POA: Diagnosis not present

## 2017-06-24 DIAGNOSIS — J449 Chronic obstructive pulmonary disease, unspecified: Secondary | ICD-10-CM | POA: Diagnosis not present

## 2017-06-26 ENCOUNTER — Encounter (HOSPITAL_COMMUNITY)
Admission: RE | Admit: 2017-06-26 | Discharge: 2017-06-26 | Disposition: A | Discharge status: Home / Self Care | Payer: BLUE CROSS/BLUE SHIELD | Source: Ambulatory Visit | Attending: Internal Medicine | Admitting: Internal Medicine

## 2017-06-26 DIAGNOSIS — R0609 Other forms of dyspnea: Secondary | ICD-10-CM | POA: Diagnosis not present

## 2017-06-26 DIAGNOSIS — D860 Sarcoidosis of lung: Secondary | ICD-10-CM

## 2017-06-26 NOTE — Progress Notes (Signed)
Pulmonary Individual Treatment Plan  Patient Details  Name: [name] MRN: [id number] Date of Birth: [birth year] Referring Provider:     Pulmonary Rehab Walk Test from 06/26/2017 in [place]  Referring Provider  Dr. Melvyn Novas      Initial Encounter Date:    Pulmonary Rehab Walk Test from 06/26/2017 in [place]  Date  06/26/17  Referring Provider  Dr. Melvyn Novas      Visit Diagnosis: Sarcoidosis of lung ([place])  Patient's Home Medications on Admission:   Current Outpatient Prescriptions:  .  acetaminophen (TYLENOL) 500 MG tablet, Take 1,000 mg by mouth every 6 (six) hours as needed for fever. Reported on 05/10/2016, Disp: , Rfl:  .  albuterol (PROVENTIL HFA;VENTOLIN HFA) 108 (90 Base) MCG/ACT inhaler, Inhale 2 puffs into the lungs every 6 (six) hours as needed for wheezing or shortness of breath. (Patient not taking: Reported on 06/18/2017), Disp: 1 Inhaler, Rfl: 5 .  albuterol (VENTOLIN HFA) 108 (90 Base) MCG/ACT inhaler, Inhale 2 puffs into the lungs every 6 (six) hours as needed for wheezing or shortness of breath. (Patient not taking: Reported on 06/18/2017), Disp: 1 Inhaler, Rfl: 2 .  anastrozole (ARIMIDEX) 1 MG tablet, Take 1 tablet (1 mg total) by mouth at bedtime., Disp: 90 tablet, Rfl: 3 .  atorvastatin (LIPITOR) 20 MG tablet, Take 1 tablet (20 mg total) by mouth daily., Disp: 90 tablet, Rfl: 3 .  budesonide-formoterol (SYMBICORT) 160-4.5 MCG/ACT inhaler, Inhale 2 puffs into the lungs 2 (two) times daily., Disp: 1 Inhaler, Rfl: 2 .  diphenhydrAMINE (BENADRYL) 25 MG tablet, Take 1 tablet (25 mg total) by mouth every 6 (six) hours as needed. (Patient not taking: Reported on 06/18/2017), Disp: 30 tablet, Rfl: 0 .  fluticasone (FLONASE) 50 MCG/ACT nasal spray, Place 2 sprays into both nostrils daily as needed for allergies., Disp: 16 g, Rfl: 11 .  gabapentin (NEURONTIN) 300 MG capsule, Take 1 capsule (300 mg total) by mouth at  bedtime., Disp: 90 capsule, Rfl: 3 .  ibuprofen (ADVIL,MOTRIN) 600 MG tablet, Take 1 tablet (600 mg total) by mouth every 8 (eight) hours as needed., Disp: 30 tablet, Rfl: 0 .  metoprolol tartrate (LOPRESSOR) 25 MG tablet, Take 1 tablet (25 mg total) by mouth 2 (two) times daily., Disp: 60 tablet, Rfl: 11 .  Multiple Vitamin (MULTIVITAMIN) capsule, Take 1 capsule by mouth daily., Disp: , Rfl:  .  omeprazole (PRILOSEC) 20 MG capsule, TAKE ONE CAPSULE BY MOUTH  DAILY, Disp: 30 capsule, Rfl: 5 .  oxymetazoline (AFRIN NASAL SPRAY) 0.05 % nasal spray, Place 1 spray into both nostrils 2 (two) times daily. (Patient not taking: Reported on 06/18/2017), Disp: 30 mL, Rfl: 0 .  predniSONE (DELTASONE) 5 MG tablet, Take 2.5 mg by mouth daily with breakfast. Take 1/2 tablet daily (2.62m total), Disp: , Rfl:  .  triamterene-hydrochlorothiazide (MAXZIDE-25) 37.5-25 MG tablet, TAKE ONE TABLET BY MOUTH ONCE DAILY, Disp: 30 tablet, Rfl: 11  Past Medical History: Past Medical History:  Diagnosis Date  . Allergic rhinitis   . COPD, mild ([facility] FOLLOWED BY DR WMelvyn Novas . GERD (gastroesophageal reflux disease)   . HTN (hypertension)   . Hypertension   . Iron deficiency anemia   . Long-term current use of steroids SYMBICORT INHALER  . 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 Sarcoidosis STABLE PER CXR JUNE 2013  . Shortness of breath   . Sickle cell trait (HCC)     Tobacco Use: History  Smoking Status  . Former Smoker  . Packs/day: 1.00  . Years: 20.00  . Quit date: 11/14/2003  Smokeless Tobacco  . Never Used    Labs: Recent Review Flowsheet Data    Labs for ITP Cardiac and Pulmonary Rehab Latest Ref Rng & Units 12/20/2012 09/10/2013 02/04/2015 08/13/2015 02/05/2017   Cholestrol 100 - 199 mg/dL 187 - 178 - 247(H)   LDLCALC 0 - 99 mg/dL 106(H) - 118(H) - 131(H)   HDL >39 mg/dL 53 - 43(L) - 80   Trlycerides 0 - 149 mg/dL 139 - 84 - 178(H)   Hemoglobin A1c - - 5.9 5.9 - -   TCO2 0 - 100 mmol/L - - - 30 -      Capillary Blood  Glucose: Lab Results  Component Value Date   GLUCAP 83 07/19/2012     ADL [facility]:     Pulmonary Assessment Scores    Row Name 06/21/17 1113         ADL [facility]   ADL Phase Entry     SOB Score total 53       CAT Score   CAT Score 11  Entry        Pulmonary Function Assessment:     Pulmonary Function Assessment - 06/18/17 1436      Breath   Bilateral Breath Sounds Clear;Decreased   Shortness of Breath Yes;Limiting activity      Exercise Target Goals: Date: 06/26/17  Exercise Program Goal: Individual exercise prescription set with THRR, safety & activity barriers. Participant demonstrates ability to understand and report RPE using BORG scale, to self-measure pulse accurately, and to acknowledge the importance of the exercise prescription.  Exercise Prescription Goal: Starting with aerobic activity 30 plus minutes a day, 3 days per week for initial exercise prescription. Provide home exercise prescription and guidelines that participant acknowledges understanding prior to discharge.  Activity Barriers & Risk Stratification:   6 Minute Walk:     6 Minute Walk    Row Name 06/26/17 1634         6 Minute Walk   Phase Initial     Distance 1037 feet     Walk Time 5 minutes     # of Rest Breaks 1  1 minute     MPH 1.96     METS 2.53     RPE 11     Perceived Dyspnea  2     Symptoms No     Resting HR 78 bpm     Resting BP 120/79     Max Ex. HR 118 bpm     Max Ex. BP 175/78     2 Minute Post BP 122/85       Interval HR   Baseline HR 78     1 Minute HR 97     2 Minute HR 106     3 Minute HR 86     4 Minute HR 101     5 Minute HR 118     6 Minute HR 118     2 Minute Post HR 88     Interval Heart Rate? Yes       Interval Oxygen   Interval Oxygen? Yes     Baseline Oxygen Saturation % 94 %     Baseline Liters of Oxygen 2 L     1 Minute Oxygen Saturation % 91 %     1 Minute Liters of Oxygen 2 L     2 Minute  Oxygen Saturation % 87 %     2 Minute Liters  of Oxygen 2 L     3 Minute Oxygen Saturation % 93 %     3 Minute Liters of Oxygen 3 L     4 Minute Oxygen Saturation % 92 %     4 Minute Liters of Oxygen 3 L     5 Minute Oxygen Saturation % 89 %     5 Minute Liters of Oxygen 3 L     6 Minute Oxygen Saturation % 89 %     6 Minute Liters of Oxygen 3 L     2 Minute Post Oxygen Saturation % 96 %     2 Minute Post Liters of Oxygen 3 L        Oxygen Initial Assessment:     Oxygen Initial Assessment - 06/26/17 1633      Initial 6 min Walk   Oxygen Used Continuous;E-Tanks   Liters per minute 3   Resting Oxygen Saturation  during 6 min walk 93 %   Exercise Oxygen Saturation  during 6 min walk 87 %  87 on 2 liters, titrated to 3 liters     Program Oxygen Prescription   Program Oxygen Prescription Continuous;E-Tanks   Liters per minute 3      Oxygen Re-Evaluation:   Oxygen Discharge (Final Oxygen Re-Evaluation):   Initial Exercise Prescription:     Initial Exercise Prescription - 06/26/17 1600      Date of Initial Exercise RX and Referring Provider   Date 06/26/17   Referring Provider Dr. Melvyn Novas     Oxygen   Oxygen Continuous   Liters 3     Bike   Level 0.5   Minutes 17     NuStep   Level 2   Minutes 17   METs 1.4     Track   Laps 5   Minutes 17     Prescription Details   Frequency (times per week) 2   Duration Progress to 45 minutes of aerobic exercise without signs/symptoms of physical distress     Intensity   THRR 40-80% of Max Heartrate 65-131   Ratings of Perceived Exertion 11-13   Perceived Dyspnea 0-4     Progression   Progression Continue progressive overload as per policy without signs/symptoms or physical distress.     Resistance Training   Training Prescription Yes   Weight blue bands   Reps 10-15      Perform Capillary Blood Glucose checks as needed.  Exercise Prescription Changes:   Exercise Comments:   Exercise Goals and Review:     Exercise Goals    Row Name 06/18/17  1420             Exercise Goals   Increase Physical Activity Yes       Intervention Provide advice, education, support and counseling about physical activity/exercise needs.;Develop an individualized exercise prescription for aerobic and resistive training based on initial evaluation findings, risk stratification, comorbidities and participant's personal goals.       Expected Outcomes Achievement of increased cardiorespiratory fitness and enhanced flexibility, muscular endurance and strength shown through measurements of functional capacity and personal statement of participant.       Increase Strength and Stamina Yes       Intervention Provide advice, education, support and counseling about physical activity/exercise needs.;Develop an individualized exercise prescription for aerobic and resistive training based on initial evaluation findings, risk stratification, comorbidities and participant's personal goals.  Expected Outcomes Achievement of increased cardiorespiratory fitness and enhanced flexibility, muscular endurance and strength shown through measurements of functional capacity and personal statement of participant.          Exercise Goals Re-Evaluation :   Discharge Exercise Prescription (Final Exercise Prescription Changes):   Nutrition:  Target Goals: Understanding of nutrition guidelines, daily intake of sodium 1500mg , cholesterol 200mg , calories 30% from fat and 7% or less from saturated fats, daily to have 5 or more servings of fruits and vegetables.  Biometrics:     Pre Biometrics - 06/18/17 1447      Pre Biometrics   Grip Strength 29 kg       Nutrition Therapy Plan and Nutrition Goals:   Nutrition Discharge: Rate Your Plate Scores:   Nutrition Goals Re-Evaluation:   Nutrition Goals Discharge (Final Nutrition Goals Re-Evaluation):   Psychosocial: Target Goals: Acknowledge presence or absence of significant depression and/or stress, maximize  coping skills, provide positive support system. Participant is able to verbalize types and ability to use techniques and skills needed for reducing stress and depression.  Initial Review & Psychosocial Screening:     Initial Psych Review & Screening - 06/18/17 1438      Initial Review   Current issues with Current Sleep Concerns  sleep deprevation from hot flashes     Family Dynamics   Good Support System? Yes   Comments recent loss of mother     Barriers   Psychosocial barriers to participate in program There are no identifiable barriers or psychosocial needs.     Screening Interventions   Interventions Encouraged to exercise      Quality of Life Scores:   PHQ-9: Recent Review Flowsheet Data    Depression screen [facility] 2/9 06/18/2017 04/12/2017 03/29/2017 03/13/2017 02/05/2017   Decreased Interest 0 0 0 0 0   Down, Depressed, Hopeless 0 0 0 0 0   PHQ - 2 Score 0 0 0 0 0     Interpretation of Total Score  Total Score Depression Severity:  1-4 = Minimal depression, 5-9 = Mild depression, 10-14 = Moderate depression, 15-19 = Moderately severe depression, 20-27 = Severe depression   Psychosocial Evaluation and Intervention:     Psychosocial Evaluation - 06/18/17 1440      Psychosocial Evaluation & Interventions   Interventions Encouraged to exercise with the program and follow exercise prescription   Expected Outcomes patient will remain free from psychosocial barriers   Continue Psychosocial Services  Follow up required by staff      Psychosocial Re-Evaluation:   Psychosocial Discharge (Final Psychosocial Re-Evaluation):   Education: Education Goals: Education classes will be provided on a weekly basis, covering required topics. Participant will state understanding/return demonstration of topics presented.  Learning Barriers/Preferences:     Learning Barriers/Preferences - 06/18/17 1436      Learning Barriers/Preferences   Learning Barriers None   Learning  Preferences Computer/Internet;Group Instruction;Individual Instruction;Verbal Instruction;Written Material      Education Topics: Risk Factor Reduction:  -Group instruction that is supported by a PowerPoint presentation. Instructor discusses the definition of a risk factor, different risk factors for pulmonary disease, and how the heart and lungs work together.     Nutrition for Pulmonary Patient:  -Group instruction provided by PowerPoint slides, verbal discussion, and written materials to support subject matter. The instructor gives an explanation and review of healthy diet recommendations, which includes a discussion on weight management, recommendations for fruit and vegetable consumption, as well as protein, fluid, caffeine, fiber, sodium, sugar,  and alcohol. Tips for eating when patients are short of breath are discussed.   Pursed Lip Breathing:  -Group instruction that is supported by demonstration and informational handouts. Instructor discusses the benefits of pursed lip and diaphragmatic breathing and detailed demonstration on how to preform both.     Oxygen Safety:  -Group instruction provided by PowerPoint, verbal discussion, and written material to support subject matter. There is an overview of "What is Oxygen" and "Why do we need it".  Instructor also reviews how to create a safe environment for oxygen use, the importance of using oxygen as prescribed, and the risks of noncompliance. There is a brief discussion on traveling with oxygen and resources the patient may utilize.   Oxygen Equipment:  -Group instruction provided by [facility] Staff utilizing handouts, written materials, and equipment demonstrations.   Signs and Symptoms:  -Group instruction provided by written material and verbal discussion to support subject matter. Warning signs and symptoms of infection, stroke, and heart attack are reviewed and when to call the physician/911 reinforced. Tips for preventing the  spread of infection discussed.   Advanced Directives:  -Group instruction provided by verbal instruction and written material to support subject matter. Instructor reviews Advanced Directive laws and proper instruction for filling out document.   Pulmonary Video:  -Group video education that reviews the importance of medication and oxygen compliance, exercise, good nutrition, pulmonary hygiene, and pursed lip and diaphragmatic breathing for the pulmonary patient.   Exercise for the Pulmonary Patient:  -Group instruction that is supported by a PowerPoint presentation. Instructor discusses benefits of exercise, core components of exercise, frequency, duration, and intensity of an exercise routine, importance of utilizing pulse oximetry during exercise, safety while exercising, and options of places to exercise outside of rehab.     Pulmonary Medications:  -Verbally interactive group education provided by instructor with focus on inhaled medications and proper administration.   Anatomy and Physiology of the Respiratory System and Intimacy:  -Group instruction provided by PowerPoint, verbal discussion, and written material to support subject matter. Instructor reviews respiratory cycle and anatomical components of the respiratory system and their functions. Instructor also reviews differences in obstructive and restrictive respiratory diseases with examples of each. Intimacy, Sex, and Sexuality differences are reviewed with a discussion on how relationships can change when diagnosed with pulmonary disease. Common sexual concerns are reviewed.   MD DAY -A group question and answer session with a medical doctor that allows participants to ask questions that relate to their pulmonary disease state.   OTHER EDUCATION -Group or individual verbal, written, or video instructions that support the educational goals of the pulmonary rehab program.   Knowledge Questionnaire Score:     Knowledge  Questionnaire Score - 06/21/17 1112      Knowledge Questionnaire Score   Pre Score 10/13      Core Components/Risk Factors/Patient Goals at Admission:     Personal Goals and Risk Factors at Admission - 06/18/17 1436      Core Components/Risk Factors/Patient Goals on Admission    Weight Management Obesity;Yes   Intervention Weight Management: Develop a combined nutrition and exercise program designed to reach desired caloric intake, while maintaining appropriate intake of nutrient and fiber, sodium and fats, and appropriate energy expenditure required for the weight goal.;Weight Management/Obesity: Establish reasonable short term and long term weight goals.;Weight Management: Provide education and appropriate resources to help participant work on and attain dietary goals.;Obesity: Provide education and appropriate resources to help participant work on and attain dietary  goals.   Expected Outcomes Short Term: Continue to assess and modify interventions until short term weight is achieved;Long Term: Adherence to nutrition and physical activity/exercise program aimed toward attainment of established weight goal;Understanding of distribution of calorie intake throughout the day with the consumption of 4-5 meals/snacks;Understanding recommendations for meals to include 15-35% energy as protein, 25-35% energy from fat, 35-60% energy from carbohydrates, less than 200mg  of dietary cholesterol, 20-35 gm of total fiber daily;Weight Loss: Understanding of general recommendations for a balanced deficit meal plan, which promotes 1-2 lb weight loss per week and includes a negative energy balance of [phone] kcal/d   Improve shortness of breath with ADL's Yes   Intervention Provide education, individualized exercise plan and daily activity instruction to help decrease symptoms of SOB with activities of daily living.   Expected Outcomes Short Term: Achieves a reduction of symptoms when performing activities of  daily living.   Develop more efficient breathing techniques such as purse lipped breathing and diaphragmatic breathing; and practicing self-pacing with activity Yes   Intervention Provide education, demonstration and support about specific breathing techniuqes utilized for more efficient breathing. Include techniques such as pursed lipped breathing, diaphragmatic breathing and self-pacing activity.   Expected Outcomes Short Term: Participant will be able to demonstrate and use breathing techniques as needed throughout daily activities.   Increase knowledge of respiratory medications and ability to use respiratory devices properly  Yes   Intervention Provide education and demonstration as needed of appropriate use of medications, inhalers, and oxygen therapy.   Expected Outcomes Short Term: Achieves understanding of medications use. Understands that oxygen is a medication prescribed by physician. Demonstrates appropriate use of inhaler and oxygen therapy.      Core Components/Risk Factors/Patient Goals Review:    Core Components/Risk Factors/Patient Goals at Discharge (Final Review):    ITP Comments:   Comments:

## 2017-06-28 ENCOUNTER — Ambulatory Visit: Payer: BLUE CROSS/BLUE SHIELD | Admitting: Dietician

## 2017-06-28 ENCOUNTER — Encounter (HOSPITAL_COMMUNITY): Payer: BLUE CROSS/BLUE SHIELD

## 2017-07-03 ENCOUNTER — Encounter (HOSPITAL_COMMUNITY)
Admission: RE | Admit: 2017-07-03 | Discharge: 2017-07-03 | Disposition: A | Discharge status: Home / Self Care | Payer: BLUE CROSS/BLUE SHIELD | Source: Ambulatory Visit | Attending: Internal Medicine | Admitting: Internal Medicine

## 2017-07-03 VITALS — Wt 210.5 lb

## 2017-07-03 DIAGNOSIS — R0609 Other forms of dyspnea: Secondary | ICD-10-CM | POA: Diagnosis not present

## 2017-07-03 DIAGNOSIS — D860 Sarcoidosis of lung: Secondary | ICD-10-CM

## 2017-07-03 NOTE — Progress Notes (Signed)
Daily Session Note  Patient Details  Name: [name] MRN: [id number] Date of Birth: Dec 02, 1960 Referring Provider:     Pulmonary Rehab Walk Test from 06/26/2017 in [place]  Referring Provider  Dr. Melvyn Novas      Encounter Date: 07/03/2017  Check In:     Session Check In - 07/03/17 1209      Check-In   Location [facility]   Staff Present Su Hilt, MS, ACSM RCEP, Exercise Physiologist;Kanani Mowbray Ysidro Evert, RN;Portia Rollene Rotunda, RN, BSN;Other   Supervising physician immediately available to respond to emergencies [facility] immediately available   Physician(s) Dr. Wendee Beavers   Medication changes reported     No   Fall or balance concerns reported    No   Tobacco Cessation No Change   Warm-up and Cool-down Performed as group-led instruction   Resistance Training Performed Yes   VAD Patient? No     Pain Assessment   Currently in Pain? No/denies   Multiple Pain Sites No      Capillary Blood Glucose: No results found for this or any previous visit (from the past 24 hour(s)).      Exercise Prescription Changes - 07/03/17 1200      Response to Exercise   Blood Pressure (Admit) 118/68   Blood Pressure (Exercise) 126/72   Blood Pressure (Exit) 114/70   Heart Rate (Admit) 72 bpm   Heart Rate (Exercise) 104 bpm   Heart Rate (Exit) 75 bpm   Oxygen Saturation (Admit) 92 %   Oxygen Saturation (Exercise) 89 %   Oxygen Saturation (Exit) 99 %   Rating of Perceived Exertion (Exercise) 11   Perceived Dyspnea (Exercise) 2   Duration Progress to 45 minutes of aerobic exercise without signs/symptoms of physical distress   Intensity Other (comment)  40-80% of HRR     Progression   Progression Continue to progress workloads to maintain intensity without signs/symptoms of physical distress.     Resistance Training   Training Prescription Yes   Weight blue bands   Reps 10-15   Time 10 Minutes     Oxygen   Oxygen Continuous   Liters  3     Bike   Level 0.5   Minutes 17     NuStep   Level 2   Minutes 17   METs 1.9     Track   Laps 5   Minutes 17      History  Smoking Status  . Former Smoker  . Packs/day: 1.00  . Years: 20.00  . Quit date: 11/14/2003  Smokeless Tobacco  . Never Used    Goals Met:  Exercise tolerated well No report of cardiac concerns or symptoms Strength training completed today  Goals Unmet:  Not Applicable  Comments: Service time is from 1030 to 1200    Dr. Rush Farmer is Medical Director for Pulmonary Rehab at [facility].

## 2017-07-05 ENCOUNTER — Encounter (HOSPITAL_COMMUNITY)
Admission: RE | Admit: 2017-07-05 | Discharge: 2017-07-05 | Disposition: A | Discharge status: Home / Self Care | Payer: BLUE CROSS/BLUE SHIELD | Source: Ambulatory Visit | Attending: Internal Medicine | Admitting: Internal Medicine

## 2017-07-05 VITALS — Wt 211.4 lb

## 2017-07-05 DIAGNOSIS — D860 Sarcoidosis of lung: Secondary | ICD-10-CM

## 2017-07-05 DIAGNOSIS — R0609 Other forms of dyspnea: Secondary | ICD-10-CM | POA: Diagnosis not present

## 2017-07-05 NOTE — Progress Notes (Signed)
Daily Session Note  Patient Details  Name: [name] MRN: [id number] Date of Birth: Jun 23, 1961 Referring Provider:     Pulmonary Rehab Walk Test from 06/26/2017 in [place]  Referring Provider  Dr. Melvyn Novas      Encounter Date: 07/05/2017  Check In:     Session Check In - 07/05/17 1030      Check-In   Location [facility]   Staff Present Su Hilt, MS, ACSM RCEP, Exercise Physiologist;Lisa Ysidro Evert, RN;Portia Rollene Rotunda, RN, BSN;Other   Supervising physician immediately available to respond to emergencies [facility] immediately available   Physician(s) Dr. Clementeen Graham   Medication changes reported     No   Fall or balance concerns reported    No   Tobacco Cessation No Change   Warm-up and Cool-down Performed as group-led instruction   Resistance Training Performed Yes   VAD Patient? No     Pain Assessment   Currently in Pain? No/denies   Multiple Pain Sites No      Capillary Blood Glucose: No results found for this or any previous visit (from the past 24 hour(s)).      Exercise Prescription Changes - 07/05/17 1200      Response to Exercise   Blood Pressure (Admit) 138/80   Blood Pressure (Exercise) 134/84   Blood Pressure (Exit) 110/70   Heart Rate (Admit) 65 bpm   Heart Rate (Exercise) 104 bpm   Heart Rate (Exit) 80 bpm   Oxygen Saturation (Admit) 96 %   Oxygen Saturation (Exercise) 90 %   Oxygen Saturation (Exit) 98 %   Rating of Perceived Exertion (Exercise) 11   Perceived Dyspnea (Exercise) 1   Duration Progress to 45 minutes of aerobic exercise without signs/symptoms of physical distress   Intensity Other (comment)  40-80% of HRR     Progression   Progression Continue to progress workloads to maintain intensity without signs/symptoms of physical distress.     Resistance Training   Training Prescription Yes   Weight blue bands   Reps 10-15   Time 10 Minutes     Oxygen   Oxygen Continuous   Liters 3     NuStep   Level 2   Minutes 17   METs 2.1     Track   Laps 12   Minutes 17      History  Smoking Status  . Former Smoker  . Packs/day: 1.00  . Years: 20.00  . Quit date: 11/14/2003  Smokeless Tobacco  . Never Used    Goals Met:  Exercise tolerated well No report of cardiac concerns or symptoms Strength training completed today  Goals Unmet:  Not Applicable  Comments: Service time is from 10:30a to 12:35p    Dr. Rush Farmer is Medical Director for Pulmonary Rehab at [facility].

## 2017-07-09 NOTE — Progress Notes (Signed)
[name] 56 y.o. female  DOB: 08/19/1961 MRN: [id number]           Nutrition Brief Note 1. Sarcoidosis of lung [facility])    Past Medical History:  Diagnosis Date  . Allergic rhinitis   . COPD, mild ([place]) FOLLOWED BY DR Melvyn Novas  . GERD (gastroesophageal reflux disease)   . HTN (hypertension)   . Hypertension   . Iron deficiency anemia   . Long-term current use of steroids SYMBICORT INHALER  . 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 Sarcoidosis STABLE PER CXR JUNE 2013  . Shortness of breath   . Sickle cell trait ([place])    Meds reviewed. Deltasone noted  Ht: Ht Readings from Last 1 Encounters:  06/18/17 _0  (1.651 m)     Wt:  Wt Readings from Last 3 Encounters:  07/05/17 211 lb 6.7 oz (95.9 kg)  07/03/17 210 lb 8.6 oz (95.5 kg)  06/18/17 210 lb 15.7 oz (95.7 kg)     BMI: 35.0    Current tobacco use? No  Labs:  Lipid Panel     Component Value Date/Time   CHOL 247 (H) 02/05/2017 1528   TRIG 178 (H) 02/05/2017 1528   HDL 80 02/05/2017 1528   CHOLHDL 3.1 02/05/2017 1528   CHOLHDL 4.1 02/04/2015 1619   VLDL 17 02/04/2015 1619   LDLCALC 131 (H) 02/05/2017 1528   Nutrition Diagnosis ? Food-and nutrition-related knowledge deficit related to lack of exposure to information as related to diagnosis of pulmonary disease ? Obesity related to excessive energy intake as evidenced by a BMI of 35.0  Goal(s) 1. Identify food quantities necessary to achieve wt loss of 1-2 lb per week to a goal wt loss of 2.7-10.9 kg (6-24 lb) at graduation from pulmonary rehab. 2. Describe the benefit of including fruits, vegetables, whole grains, and low-fat dairy products in a healthy meal plan.  Plan:  Pt to attend Pulmonary Nutrition class - met 07/05/17 Will provide client-centered nutrition education as part of interdisciplinary care.   Monitor and evaluate progress toward nutrition goal with team.  Monitor and Evaluate progress toward nutrition goal with team.   Derek Mound, M.Ed, RD, LDN, CDE 07/09/2017  3:12 PM

## 2017-07-10 ENCOUNTER — Encounter (HOSPITAL_COMMUNITY)
Admission: RE | Admit: 2017-07-10 | Discharge: 2017-07-10 | Disposition: A | Discharge status: Home / Self Care | Payer: BLUE CROSS/BLUE SHIELD | Source: Ambulatory Visit | Attending: Internal Medicine | Admitting: Internal Medicine

## 2017-07-10 VITALS — Wt 211.2 lb

## 2017-07-10 DIAGNOSIS — R0609 Other forms of dyspnea: Secondary | ICD-10-CM | POA: Diagnosis not present

## 2017-07-10 DIAGNOSIS — D860 Sarcoidosis of lung: Secondary | ICD-10-CM

## 2017-07-10 NOTE — Progress Notes (Signed)
Daily Session Note  Patient Details  Name: [name] MRN: [id number] Date of Birth: 30-Jun-1961 Referring Provider:     Pulmonary Rehab Walk Test from 06/26/2017 in [place]  Referring Provider  Dr. Melvyn Novas      Encounter Date: 07/10/2017  Check In:     Session Check In - 07/10/17 1157      Check-In   Location [facility]   Staff Present Su Hilt, MS, ACSM RCEP, Exercise Physiologist;Other;Portia Rollene Rotunda, RN, BSN   Supervising physician immediately available to respond to emergencies [facility] immediately available   Physician(s) Dr. Clementeen Graham   Medication changes reported     No   Fall or balance concerns reported    No   Tobacco Cessation No Change   Warm-up and Cool-down Performed as group-led instruction   Resistance Training Performed Yes   VAD Patient? No     Pain Assessment   Currently in Pain? No/denies   Multiple Pain Sites No      Capillary Blood Glucose: No results found for this or any previous visit (from the past 24 hour(s)).      Exercise Prescription Changes - 07/10/17 1200      Response to Exercise   Blood Pressure (Admit) 110/68   Blood Pressure (Exercise) 132/68   Blood Pressure (Exit) 104/60   Heart Rate (Admit) 78 bpm   Heart Rate (Exercise) 108 bpm   Heart Rate (Exit) 71 bpm   Oxygen Saturation (Admit) 98 %   Oxygen Saturation (Exercise) 87 %   Oxygen Saturation (Exit) 98 %   Rating of Perceived Exertion (Exercise) 13   Perceived Dyspnea (Exercise) 3   Duration Progress to 45 minutes of aerobic exercise without signs/symptoms of physical distress   Intensity Other (comment)  40-80% of HRR     Progression   Progression Continue to progress workloads to maintain intensity without signs/symptoms of physical distress.     Resistance Training   Training Prescription Yes   Weight blue bands   Reps 10-15   Time 10 Minutes     Oxygen   Oxygen Continuous   Liters 3     Bike    Level 0.5   Minutes 17     NuStep   Level 3   Minutes 17   METs 2.1     Track   Laps 11   Minutes 17      History  Smoking Status  . Former Smoker  . Packs/day: 1.00  . Years: 20.00  . Quit date: 11/14/2003  Smokeless Tobacco  . Never Used    Goals Met:  Exercise tolerated well No report of cardiac concerns or symptoms Strength training completed today  Goals Unmet:  Not Applicable  Comments: Service time is from 10:30a to 12:10p    Dr. Rush Farmer is Medical Director for Pulmonary Rehab at [facility].

## 2017-07-11 NOTE — Progress Notes (Signed)
Pulmonary Individual Treatment Plan  Patient Details  Name: [name] MRN: [id number] Date of Birth: [birth year] Referring Provider:     Pulmonary Rehab Walk Test from 06/26/2017 in [facility]  Referring Provider  Dr. Melvyn Novas      Initial Encounter Date:    Pulmonary Rehab Walk Test from 06/26/2017 in [facility]  Date  06/26/17  Referring Provider  Dr. Melvyn Novas      Visit Diagnosis: Sarcoidosis of lung ([place])  Patient's Home Medications on Admission:   Current Outpatient Prescriptions:  .  acetaminophen (TYLENOL) 500 MG tablet, Take 1,000 mg by mouth every 6 (six) hours as needed for fever. Reported on 05/10/2016, Disp: , Rfl:  .  albuterol (PROVENTIL HFA;VENTOLIN HFA) 108 (90 Base) MCG/ACT inhaler, Inhale 2 puffs into the lungs every 6 (six) hours as needed for wheezing or shortness of breath. (Patient not taking: Reported on 06/18/2017), Disp: 1 Inhaler, Rfl: 5 .  albuterol (VENTOLIN HFA) 108 (90 Base) MCG/ACT inhaler, Inhale 2 puffs into the lungs every 6 (six) hours as needed for wheezing or shortness of breath. (Patient not taking: Reported on 06/18/2017), Disp: 1 Inhaler, Rfl: 2 .  anastrozole (ARIMIDEX) 1 MG tablet, Take 1 tablet (1 mg total) by mouth at bedtime., Disp: 90 tablet, Rfl: 3 .  atorvastatin (LIPITOR) 20 MG tablet, Take 1 tablet (20 mg total) by mouth daily., Disp: 90 tablet, Rfl: 3 .  budesonide-formoterol (SYMBICORT) 160-4.5 MCG/ACT inhaler, Inhale 2 puffs into the lungs 2 (two) times daily., Disp: 1 Inhaler, Rfl: 2 .  diphenhydrAMINE (BENADRYL) 25 MG tablet, Take 1 tablet (25 mg total) by mouth every 6 (six) hours as needed. (Patient not taking: Reported on 06/18/2017), Disp: 30 tablet, Rfl: 0 .  fluticasone (FLONASE) 50 MCG/ACT nasal spray, Place 2 sprays into both nostrils daily as needed for allergies., Disp: 16 g, Rfl: 11 .  gabapentin (NEURONTIN) 300 MG capsule, Take 1 capsule (300 mg total) by mouth at  bedtime., Disp: 90 capsule, Rfl: 3 .  ibuprofen (ADVIL,MOTRIN) 600 MG tablet, Take 1 tablet (600 mg total) by mouth every 8 (eight) hours as needed., Disp: 30 tablet, Rfl: 0 .  metoprolol tartrate (LOPRESSOR) 25 MG tablet, Take 1 tablet (25 mg total) by mouth 2 (two) times daily., Disp: 60 tablet, Rfl: 11 .  Multiple Vitamin (MULTIVITAMIN) capsule, Take 1 capsule by mouth daily., Disp: , Rfl:  .  omeprazole (PRILOSEC) 20 MG capsule, TAKE ONE CAPSULE BY MOUTH  DAILY, Disp: 30 capsule, Rfl: 5 .  oxymetazoline (AFRIN NASAL SPRAY) 0.05 % nasal spray, Place 1 spray into both nostrils 2 (two) times daily. (Patient not taking: Reported on 06/18/2017), Disp: 30 mL, Rfl: 0 .  predniSONE (DELTASONE) 5 MG tablet, Take 2.5 mg by mouth daily with breakfast. Take 1/2 tablet daily (2.62m total), Disp: , Rfl:  .  triamterene-hydrochlorothiazide (MAXZIDE-25) 37.5-25 MG tablet, TAKE ONE TABLET BY MOUTH ONCE DAILY, Disp: 30 tablet, Rfl: 11  Past Medical History: Past Medical History:  Diagnosis Date  . Allergic rhinitis   . COPD, mild ([facility] FOLLOWED BY DR WMelvyn Novas . GERD (gastroesophageal reflux disease)   . HTN (hypertension)   . Hypertension   . Iron deficiency anemia   . Long-term current use of steroids SYMBICORT INHALER  . 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 Sarcoidosis STABLE PER CXR JUNE 2013  . Shortness of breath   . Sickle cell trait (HCC)     Tobacco Use: History  Smoking Status  . Former Smoker  . Packs/day: 1.00  . Years: 20.00  . Quit date: 11/14/2003  Smokeless Tobacco  . Never Used    Labs: Recent Review Flowsheet Data    Labs for ITP Cardiac and Pulmonary Rehab Latest Ref Rng & Units 12/20/2012 09/10/2013 02/04/2015 08/13/2015 02/05/2017   Cholestrol 100 - 199 mg/dL 187 - 178 - 247(H)   LDLCALC 0 - 99 mg/dL 106(H) - 118(H) - 131(H)   HDL >39 mg/dL 53 - 43(L) - 80   Trlycerides 0 - 149 mg/dL 139 - 84 - 178(H)   Hemoglobin A1c - - 5.9 5.9 - -   TCO2 0 - 100 mmol/L - - - 30 -      Capillary Blood  Glucose: Lab Results  Component Value Date   GLUCAP 83 07/19/2012     Pulmonary Assessment Scores:     Pulmonary Assessment Scores    Row Name 06/21/17 1113         ADL UCSD   ADL Phase Entry     SOB Score total 53       CAT Score   CAT Score 11  Entry        Pulmonary Function Assessment:     Pulmonary Function Assessment - 06/18/17 1436      Breath   Bilateral Breath Sounds Clear;Decreased   Shortness of Breath Yes;Limiting activity      Exercise Target Goals:    Exercise Program Goal: Individual exercise prescription set with THRR, safety & activity barriers. Participant demonstrates ability to understand and report RPE using BORG scale, to self-measure pulse accurately, and to acknowledge the importance of the exercise prescription.  Exercise Prescription Goal: Starting with aerobic activity 30 plus minutes a day, 3 days per week for initial exercise prescription. Provide home exercise prescription and guidelines that participant acknowledges understanding prior to discharge.  Activity Barriers & Risk Stratification:   6 Minute Walk:     6 Minute Walk    Row Name 06/26/17 1634         6 Minute Walk   Phase Initial     Distance 1037 feet     Walk Time 5 minutes     # of Rest Breaks 1  1 minute     MPH 1.96     METS 2.53     RPE 11     Perceived Dyspnea  2     Symptoms No     Resting HR 78 bpm     Resting BP 120/79     Max Ex. HR 118 bpm     Max Ex. BP 175/78     2 Minute Post BP 122/85       Interval HR   Baseline HR (retired) 78     1 Minute HR 97     2 Minute HR 106     3 Minute HR 86     4 Minute HR 101     5 Minute HR 118     6 Minute HR 118     2 Minute Post HR 88     Interval Heart Rate? Yes       Interval Oxygen   Interval Oxygen? Yes     Baseline Oxygen Saturation % 94 %     Resting Liters of Oxygen 2 L     1 Minute Oxygen Saturation % 91 %     1 Minute Liters of Oxygen 2 L  2 Minute Oxygen Saturation % 87 %      2 Minute Liters of Oxygen 2 L     3 Minute Oxygen Saturation % 93 %     3 Minute Liters of Oxygen 3 L     4 Minute Oxygen Saturation % 92 %     4 Minute Liters of Oxygen 3 L     5 Minute Oxygen Saturation % 89 %     5 Minute Liters of Oxygen 3 L     6 Minute Oxygen Saturation % 89 %     6 Minute Liters of Oxygen 3 L     2 Minute Post Oxygen Saturation % 96 %     2 Minute Post Liters of Oxygen 3 L        Oxygen Initial Assessment:     Oxygen Initial Assessment - 06/26/17 1633      Initial 6 min Walk   Oxygen Used Continuous;E-Tanks   Liters per minute 3   Resting Oxygen Saturation  93 %   Exercise Oxygen Saturation  during 6 min walk 87 %  87 on 2 liters, titrated to 3 liters     Program Oxygen Prescription   Program Oxygen Prescription Continuous;E-Tanks   Liters per minute 3      Oxygen Re-Evaluation:     Oxygen Re-Evaluation    Row Name 07/11/17 1713             Program Oxygen Prescription   Program Oxygen Prescription Continuous;E-Tanks       Liters per minute 3         Home Oxygen   Home Oxygen Device Portable Concentrator;Home Concentrator       Sleep Oxygen Prescription Continuous       Liters per minute 2       Home Exercise Oxygen Prescription Continuous       Liters per minute 2       Home at Rest Exercise Oxygen Prescription Continuous       Liters per minute 2       Compliance with Home Oxygen Use Yes         Goals/Expected Outcomes   Short Term Goals To learn and exhibit compliance with exercise, home and travel O2 prescription;To learn and understand importance of monitoring SPO2 with pulse oximeter and demonstrate accurate use of the pulse oximeter.;To learn and understand importance of maintaining oxygen saturations>88%;To learn and demonstrate proper pursed lip breathing techniques or other breathing techniques.;To learn and demonstrate proper use of respiratory medications       Long  Term Goals Exhibits compliance with exercise, home and  travel O2 prescription;Verbalizes importance of monitoring SPO2 with pulse oximeter and return demonstration;Maintenance of O2 saturations>88%;Exhibits proper breathing techniques, such as pursed lip breathing or other method taught during program session;Compliance with respiratory medication;Demonstrates proper use of MDI's          Oxygen Discharge (Final Oxygen Re-Evaluation):     Oxygen Re-Evaluation - 07/11/17 1713      Program Oxygen Prescription   Program Oxygen Prescription Continuous;E-Tanks   Liters per minute 3     Home Oxygen   Home Oxygen Device Portable Concentrator;Home Concentrator   Sleep Oxygen Prescription Continuous   Liters per minute 2   Home Exercise Oxygen Prescription Continuous   Liters per minute 2   Home at Rest Exercise Oxygen Prescription Continuous   Liters per minute 2   Compliance with Home Oxygen Use Yes  Goals/Expected Outcomes   Short Term Goals To learn and exhibit compliance with exercise, home and travel O2 prescription;To learn and understand importance of monitoring SPO2 with pulse oximeter and demonstrate accurate use of the pulse oximeter.;To learn and understand importance of maintaining oxygen saturations>88%;To learn and demonstrate proper pursed lip breathing techniques or other breathing techniques.;To learn and demonstrate proper use of respiratory medications   Long  Term Goals Exhibits compliance with exercise, home and travel O2 prescription;Verbalizes importance of monitoring SPO2 with pulse oximeter and return demonstration;Maintenance of O2 saturations>88%;Exhibits proper breathing techniques, such as pursed lip breathing or other method taught during program session;Compliance with respiratory medication;Demonstrates proper use of MDI's      Initial Exercise Prescription:     Initial Exercise Prescription - 06/26/17 1600      Date of Initial Exercise RX and Referring Provider   Date 06/26/17   Referring Provider Dr.  Melvyn Novas     Oxygen   Oxygen Continuous   Liters 3     Bike   Level 0.5   Minutes 17     NuStep   Level 2   Minutes 17   METs 1.4     Track   Laps 5   Minutes 17     Prescription Details   Frequency (times per week) 2   Duration Progress to 45 minutes of aerobic exercise without signs/symptoms of physical distress     Intensity   THRR 40-80% of Max Heartrate 65-131   Ratings of Perceived Exertion 11-13   Perceived Dyspnea 0-4     Progression   Progression Continue progressive overload as per policy without signs/symptoms or physical distress.     Resistance Training   Training Prescription Yes   Weight blue bands   Reps 10-15      Perform Capillary Blood Glucose checks as needed.  Exercise Prescription Changes:     Exercise Prescription Changes    Row Name 07/03/17 1200 07/05/17 1200 07/10/17 1200         Response to Exercise   Blood Pressure (Admit) 118/68 138/80 110/68     Blood Pressure (Exercise) 126/72 134/84 132/68     Blood Pressure (Exit) 114/70 110/70 104/60     Heart Rate (Admit) 72 bpm 65 bpm 78 bpm     Heart Rate (Exercise) 104 bpm 104 bpm 108 bpm     Heart Rate (Exit) 75 bpm 80 bpm 71 bpm     Oxygen Saturation (Admit) 92 % 96 % 98 %     Oxygen Saturation (Exercise) 89 % 90 % 87 %     Oxygen Saturation (Exit) 99 % 98 % 98 %     Rating of Perceived Exertion (Exercise) 11 11 13      Perceived Dyspnea (Exercise) 2 1 3      Duration Progress to 45 minutes of aerobic exercise without signs/symptoms of physical distress Progress to 45 minutes of aerobic exercise without signs/symptoms of physical distress Progress to 45 minutes of aerobic exercise without signs/symptoms of physical distress     Intensity Other (comment)  40-80% of HRR Other (comment)  40-80% of HRR Other (comment)  40-80% of HRR       Progression   Progression Continue to progress workloads to maintain intensity without signs/symptoms of physical distress. Continue to progress  workloads to maintain intensity without signs/symptoms of physical distress. Continue to progress workloads to maintain intensity without signs/symptoms of physical distress.       Horticulturist, commercial  Prescription Yes Yes Yes     Weight blue bands blue bands blue bands     Reps 10-15 10-15 10-15     Time 10 Minutes 10 Minutes 10 Minutes       Oxygen   Oxygen Continuous Continuous Continuous     Liters 3 3 3        Bike   Level 0.5  - 0.5     Minutes 17  - 17       NuStep   Level 2 2 3      Minutes 17 17 17      METs 1.9 2.1 2.1       Track   Laps 5 12 11      Minutes 17 17 17         Exercise Comments:   Exercise Goals and Review:     Exercise Goals    Row Name 06/18/17 1420             Exercise Goals   Increase Physical Activity Yes       Intervention Provide advice, education, support and counseling about physical activity/exercise needs.;Develop an individualized exercise prescription for aerobic and resistive training based on initial evaluation findings, risk stratification, comorbidities and participant's personal goals.       Expected Outcomes Achievement of increased cardiorespiratory fitness and enhanced flexibility, muscular endurance and strength shown through measurements of functional capacity and personal statement of participant.       Increase Strength and Stamina Yes       Intervention Provide advice, education, support and counseling about physical activity/exercise needs.;Develop an individualized exercise prescription for aerobic and resistive training based on initial evaluation findings, risk stratification, comorbidities and participant's personal goals.       Expected Outcomes Achievement of increased cardiorespiratory fitness and enhanced flexibility, muscular endurance and strength shown through measurements of functional capacity and personal statement of participant.          Exercise Goals Re-Evaluation :     Exercise Goals  Re-Evaluation    Row Name 07/10/17 0822             Exercise Goal Re-Evaluation   Exercise Goals Review Increase Physical Activity;Increase Strength and Stamina       Comments Patient has only attended two exercise sessions. Will cont. to monitor and progress as able.        Expected Outcomes Through exercise and education at rehab and at home, patient will increase strength and stamina. Patient will also gain a better understanding of the need for physical activity on a daily basis and the effects it can have on quality of life.          Discharge Exercise Prescription (Final Exercise Prescription Changes):     Exercise Prescription Changes - 07/10/17 1200      Response to Exercise   Blood Pressure (Admit) 110/68   Blood Pressure (Exercise) 132/68   Blood Pressure (Exit) 104/60   Heart Rate (Admit) 78 bpm   Heart Rate (Exercise) 108 bpm   Heart Rate (Exit) 71 bpm   Oxygen Saturation (Admit) 98 %   Oxygen Saturation (Exercise) 87 %   Oxygen Saturation (Exit) 98 %   Rating of Perceived Exertion (Exercise) 13   Perceived Dyspnea (Exercise) 3   Duration Progress to 45 minutes of aerobic exercise without signs/symptoms of physical distress   Intensity Other (comment)  40-80% of HRR     Progression   Progression Continue to progress workloads to maintain intensity without  signs/symptoms of physical distress.     Resistance Training   Training Prescription Yes   Weight blue bands   Reps 10-15   Time 10 Minutes     Oxygen   Oxygen Continuous   Liters 3     Bike   Level 0.5   Minutes 17     NuStep   Level 3   Minutes 17   METs 2.1     Track   Laps 11   Minutes 17      Nutrition:  Target Goals: Understanding of nutrition guidelines, daily intake of sodium <1564m, cholesterol <2041m calories 30% from fat and 7% or less from saturated fats, daily to have 5 or more servings of fruits and vegetables.  Biometrics:     Pre Biometrics - 06/18/17 1447      Pre  Biometrics   Grip Strength 29 kg       Nutrition Therapy Plan and Nutrition Goals:     Nutrition Therapy & Goals - 07/09/17 1515      Nutrition Therapy   Diet Therapeutic Lifestyle Changes     Personal Nutrition Goals   Nutrition Goal Identify food quantities necessary to achieve wt loss of 1-2 lb per week to a goal wt loss of 2.7-10.9 kg (6-24 lb) at graduation from pulmonary rehab.   Personal Goal #2 Describe the benefit of including fruits, vegetables, whole grains, and low-fat dairy products in a healthy meal plan.     Intervention Plan   Intervention Prescribe, educate and counsel regarding individualized specific dietary modifications aiming towards targeted core components such as weight, hypertension, lipid management, diabetes, heart failure and other comorbidities.   Expected Outcomes Short Term Goal: Understand basic principles of dietary content, such as calories, fat, sodium, cholesterol and nutrients.;Long Term Goal: Adherence to prescribed nutrition plan.      Nutrition Discharge: Rate Your Plate Scores:     Nutrition Assessments - 07/09/17 1515      Rate Your Plate Scores   Pre Score 52      Nutrition Goals Re-Evaluation:   Nutrition Goals Discharge (Final Nutrition Goals Re-Evaluation):   Psychosocial: Target Goals: Acknowledge presence or absence of significant depression and/or stress, maximize coping skills, provide positive support system. Participant is able to verbalize types and ability to use techniques and skills needed for reducing stress and depression.  Initial Review & Psychosocial Screening:     Initial Psych Review & Screening - 06/18/17 1438      Initial Review   Current issues with Current Sleep Concerns  sleep deprevation from hot flashes     Family Dynamics   Good Support System? Yes   Comments recent loss of mother     Barriers   Psychosocial barriers to participate in program There are no identifiable barriers or  psychosocial needs.     Screening Interventions   Interventions Encouraged to exercise      Quality of Life Scores:   PHQ-9: Recent Review Flowsheet Data    [facility] 06/18/2017 04/12/2017 03/29/2017 03/13/2017 02/05/2017   Decreased Interest 0 0 0 0 0   Down, Depressed, Hopeless 0 0 0 0 0   PHQ - 2 Score 0 0 0 0 0     Interpretation of Total Score  Total Score Depression Severity:  1-4 = Minimal depression, 5-9 = Mild depression, 10-14 = Moderate depression, 15-19 = Moderately severe depression, 20-27 = Severe depression   Psychosocial Evaluation and Intervention:     Psychosocial  Evaluation - 06/18/17 1440      Psychosocial Evaluation & Interventions   Interventions Encouraged to exercise with the program and follow exercise prescription   Expected Outcomes patient will remain free from psychosocial barriers   Continue Psychosocial Services  Follow up required by staff      Psychosocial Re-Evaluation:     Psychosocial Re-Evaluation    [place] Name 07/11/17 1716             Psychosocial Re-Evaluation   Current issues with Current Sleep Concerns       Comments recent loss of mother       Expected Outcomes patient will remain free from psychosocial barriers to participation in pulmonary rehab       Interventions Encouraged to attend Pulmonary Rehabilitation for the exercise       Continue Psychosocial Services  Follow up required by staff          Psychosocial Discharge (Final Psychosocial Re-Evaluation):     Psychosocial Re-Evaluation - 07/11/17 1716      Psychosocial Re-Evaluation   Current issues with Current Sleep Concerns   Comments recent loss of mother   Expected Outcomes patient will remain free from psychosocial barriers to participation in pulmonary rehab   Interventions Encouraged to attend Pulmonary Rehabilitation for the exercise   Continue Psychosocial Services  Follow up required by staff      Education: Education Goals: Education  classes will be provided on a weekly basis, covering required topics. Participant will state understanding/return demonstration of topics presented.  Learning Barriers/Preferences:     Learning Barriers/Preferences - 06/18/17 1436      Learning Barriers/Preferences   Learning Barriers None   Learning Preferences Computer/Internet;Group Instruction;Individual Instruction;Verbal Instruction;Written Material      Education Topics: Risk Factor Reduction:  -Group instruction that is supported by a PowerPoint presentation. Instructor discusses the definition of a risk factor, different risk factors for pulmonary disease, and how the heart and lungs work together.     Nutrition for Pulmonary Patient:  -Group instruction provided by PowerPoint slides, verbal discussion, and written materials to support subject matter. The instructor gives an explanation and review of healthy diet recommendations, which includes a discussion on weight management, recommendations for fruit and vegetable consumption, as well as protein, fluid, caffeine, fiber, sodium, sugar, and alcohol. Tips for eating when patients are short of breath are discussed.   PULMONARY REHAB OTHER RESPIRATORY from 07/05/2017 in [facility]  Date  07/05/17  Educator  Nutritionist  Instruction Review Code  2- meets goals/outcomes      Pursed Lip Breathing:  -Group instruction that is supported by demonstration and informational handouts. Instructor discusses the benefits of pursed lip and diaphragmatic breathing and detailed demonstration on how to preform both.     Oxygen Safety:  -Group instruction provided by PowerPoint, verbal discussion, and written material to support subject matter. There is an overview of "What is Oxygen" and "Why do we need it".  Instructor also reviews how to create a safe environment for oxygen use, the importance of using oxygen as prescribed, and the risks of noncompliance.  There is a brief discussion on traveling with oxygen and resources the patient may utilize.   Oxygen Equipment:  -Group instruction provided by [facility] Staff utilizing handouts, written materials, and equipment demonstrations.   Signs and Symptoms:  -Group instruction provided by written material and verbal discussion to support subject matter. Warning signs and symptoms of infection, stroke, and heart attack  are reviewed and when to call the physician/911 reinforced. Tips for preventing the spread of infection discussed.   Advanced Directives:  -Group instruction provided by verbal instruction and written material to support subject matter. Instructor reviews Advanced Directive laws and proper instruction for filling out document.   Pulmonary Video:  -Group video education that reviews the importance of medication and oxygen compliance, exercise, good nutrition, pulmonary hygiene, and pursed lip and diaphragmatic breathing for the pulmonary patient.   Exercise for the Pulmonary Patient:  -Group instruction that is supported by a PowerPoint presentation. Instructor discusses benefits of exercise, core components of exercise, frequency, duration, and intensity of an exercise routine, importance of utilizing pulse oximetry during exercise, safety while exercising, and options of places to exercise outside of rehab.     Pulmonary Medications:  -Verbally interactive group education provided by instructor with focus on inhaled medications and proper administration.   Anatomy and Physiology of the Respiratory System and Intimacy:  -Group instruction provided by PowerPoint, verbal discussion, and written material to support subject matter. Instructor reviews respiratory cycle and anatomical components of the respiratory system and their functions. Instructor also reviews differences in obstructive and restrictive respiratory diseases with examples of each. Intimacy, Sex, and Sexuality  differences are reviewed with a discussion on how relationships can change when diagnosed with pulmonary disease. Common sexual concerns are reviewed.   MD DAY -A group question and answer session with a medical doctor that allows participants to ask questions that relate to their pulmonary disease state.   OTHER EDUCATION -Group or individual verbal, written, or video instructions that support the educational goals of the pulmonary rehab program.   Knowledge Questionnaire Score:     Knowledge Questionnaire Score - 06/21/17 1112      Knowledge Questionnaire Score   Pre Score 10/13      Core Components/Risk Factors/Patient Goals at Admission:     Personal Goals and Risk Factors at Admission - 06/18/17 1436      Core Components/Risk Factors/Patient Goals on Admission    Weight Management Obesity;Yes   Intervention Weight Management: Develop a combined nutrition and exercise program designed to reach desired caloric intake, while maintaining appropriate intake of nutrient and fiber, sodium and fats, and appropriate energy expenditure required for the weight goal.;Weight Management/Obesity: Establish reasonable short term and long term weight goals.;Weight Management: Provide education and appropriate resources to help participant work on and attain dietary goals.;Obesity: Provide education and appropriate resources to help participant work on and attain dietary goals.   Expected Outcomes Short Term: Continue to assess and modify interventions until short term weight is achieved;Long Term: Adherence to nutrition and physical activity/exercise program aimed toward attainment of established weight goal;Understanding of distribution of calorie intake throughout the day with the consumption of 4-5 meals/snacks;Understanding recommendations for meals to include 15-35% energy as protein, 25-35% energy from fat, 35-60% energy from carbohydrates, less than 265m of dietary cholesterol, 20-35 gm of  total fiber daily;Weight Loss: Understanding of general recommendations for a balanced deficit meal plan, which promotes 1-2 lb weight loss per week and includes a negative energy balance of [phone] kcal/d   Improve shortness of breath with ADL's Yes   Intervention Provide education, individualized exercise plan and daily activity instruction to help decrease symptoms of SOB with activities of daily living.   Expected Outcomes Short Term: Achieves a reduction of symptoms when performing activities of daily living.   Develop more efficient breathing techniques such as purse lipped breathing and diaphragmatic breathing; and  practicing self-pacing with activity Yes   Intervention Provide education, demonstration and support about specific breathing techniuqes utilized for more efficient breathing. Include techniques such as pursed lipped breathing, diaphragmatic breathing and self-pacing activity.   Expected Outcomes Short Term: Participant will be able to demonstrate and use breathing techniques as needed throughout daily activities.   Increase knowledge of respiratory medications and ability to use respiratory devices properly  Yes   Intervention Provide education and demonstration as needed of appropriate use of medications, inhalers, and oxygen therapy.   Expected Outcomes Short Term: Achieves understanding of medications use. Understands that oxygen is a medication prescribed by physician. Demonstrates appropriate use of inhaler and oxygen therapy.      Core Components/Risk Factors/Patient Goals Review:      Goals and Risk Factor Review    Row Name 07/11/17 1715             Core Components/Risk Factors/Patient Goals Review   Personal Goals Review Weight Management/Obesity;Develop more efficient breathing techniques such as purse lipped breathing and diaphragmatic breathing and practicing self-pacing with activity.;Improve shortness of breath with ADL's       Review Patient has attended 3  sessions since admission. She works hard and is tolerating small workload increases. it is too soon to evaluate progress towards goals. Will re-eval in 30 days       Expected Outcomes see admission expected outcomes          Core Components/Risk Factors/Patient Goals at Discharge (Final Review):      Goals and Risk Factor Review - 07/11/17 1715      Core Components/Risk Factors/Patient Goals Review   Personal Goals Review Weight Management/Obesity;Develop more efficient breathing techniques such as purse lipped breathing and diaphragmatic breathing and practicing self-pacing with activity.;Improve shortness of breath with ADL's   Review Patient has attended 3 sessions since admission. She works hard and is tolerating small workload increases. it is too soon to evaluate progress towards goals. Will re-eval in 30 days   Expected Outcomes see admission expected outcomes      ITP Comments:   Comments: ITP REVIEW It is too soon to evaluate patients progress toward pulmonary rehab goals after completing 3 sessions. Expect to see progress over next 30 days. Recommend continued exercise, life style modification, education, and utilization of breathing techniques to increase stamina and strength and decrease shortness of breath with exertion.

## 2017-07-12 ENCOUNTER — Encounter (HOSPITAL_COMMUNITY)
Admission: RE | Admit: 2017-07-12 | Discharge: 2017-07-12 | Disposition: A | Discharge status: Home / Self Care | Payer: BLUE CROSS/BLUE SHIELD | Source: Ambulatory Visit | Attending: Internal Medicine | Admitting: Internal Medicine

## 2017-07-12 VITALS — Wt 212.5 lb

## 2017-07-12 DIAGNOSIS — R0609 Other forms of dyspnea: Secondary | ICD-10-CM | POA: Diagnosis not present

## 2017-07-12 DIAGNOSIS — D860 Sarcoidosis of lung: Secondary | ICD-10-CM

## 2017-07-12 NOTE — Progress Notes (Signed)
Daily Session Note  Patient Details  Name: [name] MRN: [id number] Date of Birth: September 25, 1961 Referring Provider:     Pulmonary Rehab Walk Test from 06/26/2017 in [place]  Referring Provider  Dr. Melvyn Novas      Encounter Date: 07/12/2017  Check In:     Session Check In - 07/12/17 1030      Check-In   Location [facility]   Staff Present Ramon Dredge, RN, MHA;Kashia Brossard Ysidro Evert, RN;Portia Rollene Rotunda, RN, BSN   Supervising physician immediately available to respond to emergencies [facility] immediately available   Physician(s) Dr. Justus Memory   Medication changes reported     No   Fall or balance concerns reported    No   Tobacco Cessation No Change   Warm-up and Cool-down Performed as group-led instruction   Resistance Training Performed Yes   VAD Patient? No     Pain Assessment   Currently in Pain? No/denies   Multiple Pain Sites No      Capillary Blood Glucose: No results found for this or any previous visit (from the past 24 hour(s)).      Exercise Prescription Changes - 07/12/17 1200      Response to Exercise   Blood Pressure (Admit) 132/60   Blood Pressure (Exercise) 130/70   Blood Pressure (Exit) 116/70   Heart Rate (Admit) 67 bpm   Heart Rate (Exercise) 95 bpm   Heart Rate (Exit) 67 bpm   Oxygen Saturation (Admit) 98 %   Oxygen Saturation (Exercise) 89 %   Oxygen Saturation (Exit) 99 %   Rating of Perceived Exertion (Exercise) 13   Perceived Dyspnea (Exercise) 2   Duration Progress to 45 minutes of aerobic exercise without signs/symptoms of physical distress   Intensity THRR unchanged     Progression   Progression Continue to progress workloads to maintain intensity without signs/symptoms of physical distress.     Resistance Training   Training Prescription Yes   Weight blue bands   Reps 10-15   Time 10 Minutes     Oxygen   Oxygen Continuous   Liters 3     Bike   Level 0.5   Minutes 17     Track   Laps 11   Minutes 17      History  Smoking Status  . Former Smoker  . Packs/day: 1.00  . Years: 20.00  . Quit date: 11/14/2003  Smokeless Tobacco  . Never Used    Goals Met:  Exercise tolerated well No report of cardiac concerns or symptoms Strength training completed today  Goals Unmet:  Not Applicable  Comments: Service time is from 1030 to 1220    Dr. Rush Farmer is Medical Director for Pulmonary Rehab at [facility].

## 2017-07-17 ENCOUNTER — Encounter: Payer: Self-pay | Admitting: Internal Medicine

## 2017-07-17 ENCOUNTER — Encounter (HOSPITAL_COMMUNITY): Admission: RE | Admit: 2017-07-17 | Payer: BLUE CROSS/BLUE SHIELD | Source: Ambulatory Visit

## 2017-07-17 ENCOUNTER — Ambulatory Visit (INDEPENDENT_AMBULATORY_CARE_PROVIDER_SITE_OTHER): Payer: BLUE CROSS/BLUE SHIELD | Admitting: Internal Medicine

## 2017-07-17 VITALS — BP 120/82 | HR 87 | Ht 65.0 in | Wt 209.4 lb

## 2017-07-17 DIAGNOSIS — E669 Obesity, unspecified: Secondary | ICD-10-CM | POA: Diagnosis not present

## 2017-07-17 DIAGNOSIS — J9612 Chronic respiratory failure with hypercapnia: Secondary | ICD-10-CM

## 2017-07-17 DIAGNOSIS — Z23 Encounter for immunization: Secondary | ICD-10-CM | POA: Diagnosis not present

## 2017-07-17 DIAGNOSIS — D869 Sarcoidosis, unspecified: Secondary | ICD-10-CM | POA: Diagnosis not present

## 2017-07-17 DIAGNOSIS — J449 Chronic obstructive pulmonary disease, unspecified: Secondary | ICD-10-CM

## 2017-07-17 DIAGNOSIS — J9611 Chronic respiratory failure with hypoxia: Secondary | ICD-10-CM | POA: Diagnosis not present

## 2017-07-17 NOTE — Assessment & Plan Note (Signed)
Positive transbronchial biopsy 02/19/91 by Dr. Unice Cobble  -Daily prednisone since 2007 with ? adrenal insufficiency iatrogenic > off completely Feb 2012 with no recurrence - PFT's 06/26/08 FEV1  1.09 ratio  - PFTs  03/02/10 FEV1  1.06 (42%) ratio 34 and no better p B2,  DLC0 48% corrects to 64% - PFT's 08/01/2011 FEV1 1.6 (42%)  41 ratio  DLCO 55 corrects 77 - PFTs 10/10/16    FEV1  0.87(38)ratio 41 and DLCO 36/39c corrects to 51 for alv vol > placed back on daily pred  - 02/20/2017 decreased pred to 5 mg daily > to 2.5mg  daily as of 07/17/2017   - 07/17/2017 try 2.5 mg qod   Not clear whether pred needed for copd/ab or ILD from sarcoid or airways dz from sarcoid but clearly improved and ok to try physiologic doses if tolerates

## 2017-07-17 NOTE — Assessment & Plan Note (Signed)
Body mass index is 34.85 kg/m.  -  trending up / reviewed  Lab Results  Component Value Date   TSH 0.66 11/09/2016     Contributing to gerd risk/ doe/reviewed the need and the process to achieve and maintain neg calorie balance > defer f/u primary care including intermittently monitoring thyroid status    Strongly advised food diary for nutrition review at rehab

## 2017-07-17 NOTE — Assessment & Plan Note (Signed)
Quit smoking 2005  - PFT's 06/26/08 FEV1 1.09 ratio  - PFTs 03/02/10 FEV1 1.06 ratio 34    PFT's 08/01/2011 FEV1 1.6 (42%)  41 ratio  DLCO 55 corrects 77 - Spiriva trial March 02, 2010 > ? better but not worse off it 04/2011 - 05/12/2015 p extensive coaching HFA effectiveness =    90%  - Referred to Rehab 08/21/2014 > could not arrange due to schedule  -med calendar 06/16/2013 > did not bring to office as requested 12/19/13 or 04/23/14  Or 08/21/2014  - 11/09/2016    changed symb to bevespi since taking chronic pred for pf  - 02/20/2017  After extensive coaching HFA effectiveness =    90%  - 02/20/2017 decreased pred to 5 mg daily > weaned to 2.5 mg qd as of 07/17/2017  - started rehab mid Aug 2018  - 07/17/2017 changed to pred 2.5 even days    The goal with a chronic steroid dependent illness is always arriving at the lowest effective dose that controls the disease/symptoms and not accepting a set "formula" which is based on statistics or guidelines that don't always take into account patient  variability or the natural hx of the dz in every individual patient, which may well vary over time.  For now therefore I recommend the patient maintain  2.5 mg qod if tolerates    I had an extended discussion with the patient reviewing all relevant studies completed to date and  lasting 15 to 20 minutes of a 25 minute visit    Each maintenance medication was reviewed in detail including most importantly the difference between maintenance and prns and under what circumstances the prns are to be triggered using an action plan format that is not reflected in the computer generated alphabetically organized AVS but trather by a customized med calendar that reflects the AVS meds with confirmed 100% correlation.   In addition, Please see AVS for unique instructions that I personally wrote and verbalized to the the pt in detail and then reviewed with pt  by my nurse highlighting any  changes in therapy recommended at today's  visit to their plan of care.

## 2017-07-17 NOTE — Assessment & Plan Note (Signed)
New start 09/2016 at admit see records - 10/18/16 Patient Saturations on Room Air at Rest = 92% Patient Saturations on Room Air while Ambulating = 84% Patient Saturations on 2 Liters of oxygen while Ambulating = 90% - HCO3  11/09/2016 = 39  - HCO3   03/16/17        = 34   As of  07/17/2017 rec 2lpm sleeping and titrate with ambulation to maintain > 90% - no need at rest  Adequate control on present rx, reviewed in detail with pt > no change in rx needed

## 2017-07-17 NOTE — Patient Instructions (Signed)
Try prednisone 5 mg take one half on even days only  Make a food diary for your dietician to review (list everything you eat/drink x one week for her feedback on what you are doing right vs wrong)  See Tammy NP 3 months with all your medications, even over the counter meds, separated in two separate bags, the ones you take no matter what vs the ones you stop once you feel better and take only as needed when you feel you need them.   Tammy  will generate for you a new user friendly medication calendar that will put [place] all on the same page re: your medication use.     Without this process, it simply isn't possible to assure that we are providing  your outpatient care  with  the attention to detail we feel you deserve.   If we cannot assure that you're getting that kind of care,  then we cannot manage your problem effectively from this clinic.  Once you have seen Tammy and we are sure that we're all on the same page with your medication use she will arrange follow up with me.

## 2017-07-17 NOTE — Progress Notes (Signed)
Subjective:    Patient ID: [name], female   DOB: 1960-12-04    MRN: [id number]    Brief patient profile:  39 yobf quit smoking 10/2004 dx by transbronchial biopsy 5/92 with NCG inflammatory change consistent with sarcoid. Since that time she's been off and on prednisone multiple  times with flares of coughing dyspnea and skin involvement > weaned off chronic prednisone Feb 2012.   Overall she's been doing much better since she stopped smoking 10/2004 and much better since change to Symbicort 160/4.5 2 two times a day, able to do adl's ok without limitations including some steps. Documented GOLD III copd  07/2011   Placed back on daily steroids for new resp failure 09/2016     History of Present Illness 04/23/2014 f/u ov/Wert re: cough since late May 2015  Chief Complaint  Patient presents with  . Follow-up    Pt reports cough only minimally improved since eval here on 04/13/14. Sometimes coughs until develops HA and vomiting.    Worse at hs and in am ,states already taking omeprazole 20 mg bid ac (though very hesitant with answers re meds and does not have med calendar with action plan as rec previously  rx doxy/ pred and "no better" but no purulent sputum at all now/ actually very little mucus production unless vomits from coughng Never took hycodan Not limited by breathing from desired activities   rec Go ahead and take your cough medication to completely suppress your cough Prednisone 10 mg take  4 each am x 2 days,   2 each am x 2 days,  1 each am x 2 days and stop  Add pepcid ac 20 mg at bedtime and chlortrimeton 4 mg (over the counter) x 2 at bedtime until cough is 100% gone x 2 weeks GERD  Please see patient coordinator before you leave today  to schedule sinus CT> not done   I f not better return in 4 weeks with all meds and med calendar   08/21/2014 f/u ov/Wert re:  GOLD III copd / no med calendar  Chief Complaint  Patient presents with  . Follow-up    Pt states  having increased DOE just since this am. She was SOB walking from her car to our building today.     did not use saba on day of ov/ saba was clogged up  > rec Pulm rehab> never went to  rehab      05/11/2016  f/u ov/Wert re: GOLD III / symb 160 2bid  Chief Complaint  Patient presents with  . Follow-up    Breathing is overall doing well and she rarely uses rescue inhaler. Needs refills on Symbicort.   working out at the gym but not much aerobics   No tendency to aecopd  rec No change rx/ no need for prednisone   2 weeks prior to Tgiving 2017  progressive doe indolent onset some am cough    Admit date: 10/06/2016 Discharge date: 10/10/2016    Brief/Interim Summary: 56 y.o.woman with a history of COPD, pulmonary sarcoidosis, HTN, breast cancer, and depression who presents to the ED for evaluation of progressive shortness of breath at rest and dyspnea on exertion. Symptoms have been progressive over the past 1-2 weeks. She has had intermittent cough with thick white sputum production (primarily in the mornings). She denies fevers, chills, or sweats. No light-headedness or syncope. No swelling. She has not taken anything over the counter or received any prescriptions as an outpatient  for these symptoms. She uses her Symbicort inhaler twice daily and she has an albuterol rescue inhaler at baseline  COPD exacerbation with acute hypoxic respiratory failure complicated by known history of sarcoidosis with mildly progressive changes on CT scan of her chest. -Patient reports overall improvement since admission -Lungs remain clear without audible wheezing -patient remains stable, however continues to desaturate on minimal exertion -have consulted pulmonary for assistance -Patient found to have neg respiratory viral culture, unremarkable 2d echo -Pulmonary recommended trial of lasix x 1 dose with good urine output, however patient remains supplemental O2 dependent -Pulmonary recommends  2-3 week prednisone taper -See nursing documentation, Patient requires continuous home O2  Atypical chest discomfort in the setting of hypoxia -Remains chest pain-free -This admission, serial troponins were negative 3 -Presenting EKG unremarkable  -Likely chest wall pain secondary to COPD  HTN -Blood pressure remained stable -Patient continued on metoprolol and hydrochlorothiazide  GERD -Remains stable currently -Continue pantoprazole  History of breast cancer --Continue Arimidex Currently stable  Depression --Continue Zoloft Appear stable at present  Discharge Diagnoses:    COPD exacerbation ([place])    Sarcoidosis ([place])   HYPERTENSION, BENIGN SYSTEMIC   Depression with anxiety   Difficulty in walking, not elsewhere classified   Acute on chronic respiratory failure ([place])   Malnutrition of moderate degree    10/27/2016 extended post hosp f/u ov/Wert/ transition of care  re: Sarcoid/  GOLD III copd/ symb 160 2bid  Chief Complaint  Patient presents with  . Follow-up    little troble breathing when decreased prednisone down to 10mg , hard to maneuver around the house even on oxygen at 2L  feels better on 02 and d/c on pred 60 > 20 and fine until reduced to 10 x 4 days then worse sob  Prior to reducing the reducing the dose of prednisone able to walk around home s 02 and now desat 70s on 02  Different from sarcoid because with sarcoid had assoc cough / similar to sarcoid in terms of appetite, worse off it/ better on it  rec Resume prednisone 20 mg daily until next office visit Late add: If she is going to continue to need systemic steroids, a good option for her might be lama/laba s the the ics eg BEVESPI, will  Re-consider at next ov    11/09/2016  f/u ov/Wert re:   Sarcoid / GOLD III pred still on 20 mg daily  Chief Complaint  Patient presents with  . Follow-up    Breathing is unchanged. She noticed some wheezing a day ago. She is using albuterol inhaler 2 x per  wk on average.    doe = MMRC3 = can't walk 100 yards even at a slow pace at a flat grade s stopping due to sob  Even on 02  rec We will contact advanced to get your oxygen humidified  Stop symbicort and try bevespi Take 2 puffs first thing in am and then another 2 puffs about 12 hours later.  Only use your albuterol as a rescue medication    01/09/2017  f/u ov/Wert re:  GOLD III/ pred 10 and bevespi 2 bid / alb rare Chief Complaint  Patient presents with  . Follow-up    Pt states breathing is "a little rough for me today".  She is concerned about wt gain.   doe still = MMRC3  No better p saba rec Add pepcid 20 mg at bedtime Add 02 2lpm at bedtime and continue to adjust daytime to saturations  greater than 90%  See calendar for specific medication instructions    02/20/2017  f/u ov/Wert re:   GOLD III/ pred 10 mg daily and bevespi 2bid  and alb rare  Chief Complaint  Patient presents with  . Follow-up    6wk rov. pt states breathing is baseline. pt reports of sob with exertion & occ prod cough with green mucus mainly in the morning   Doe better :  [facility] = can't walk a nl pace on a flat grade s sob but does fine slow and flat eg shopping on  2lpm / ok at rest s 02  Uses 02 2lpm sleeping  rec Drop prednisone to 10 mg one half daily      NP eval 05/14/17 It is good to see you today. We will decrease your prednisone to 2.5 mg daily. We will prescribe 5 mg tablets. We will renew your prescription for Symbicort.  2 puffs twice daily. Rinse mouth after use. Follow up appointment with Dr. Melvyn Novas in 2 months. We will refer you to Pulmonary rehab > started Aug 2018  Referral to a dietician for weight loss >  at rehab     07/17/2017  f/u ov/Wert re:   Sarcoid/ ? Still steroid dep at 2.5mg  daily / 02 none at rest, 2lpm with activity and 3lpm ex  Chief Complaint  Patient presents with  . Follow-up    COPD, still using Symbicort, still SOB with exertion, she has to increase her oxygen to 3L    Doing 3lpm when exercising at rehab and no need for saba on symb 160 2bid  Keeping up with med calendar much better   No obvious day to day or daytime variability or assoc excess/ purulent sputum or mucus plugs or hemoptysis or cp or chest tightness, subjective wheeze or overt sinus or hb symptoms. No unusual exp hx or h/o childhood pna/ asthma or knowledge of premature birth.  Sleeping ok without nocturnal  or early am exacerbation  of respiratory  c/o's or need for noct saba. Also denies any obvious fluctuation of symptoms with weather or environmental changes or other aggravating or alleviating factors except as outlined above   Current Medications, Allergies, Complete Past Medical History, Past Surgical History, Family History, and Social History were reviewed in [employer] record.  ROS  The following are not active complaints unless bolded sore throat, dysphagia, dental problems, itching, sneezing,  nasal congestion or excess/ purulent secretions, ear ache,   fever, chills, sweats, unintended wt loss, classically pleuritic or exertional cp,  orthopnea pnd or leg swelling, presyncope, palpitations, abdominal pain, anorexia, nausea, vomiting, diarrhea  or change in bowel or bladder habits, change in stools or urine, dysuria,hematuria,  rash, arthralgias, visual complaints, headache, numbness, weakness or ataxia or problems with walking or coordination,  change in mood/affect or memory.            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 Unice Cobble  -h/o Daily prednisone since 2007   > off completely Feb 2012 with no problem> restarted  09/2016  ? of SUPERFICIAL VEIN THROMBOSIS (ICD-453.9)   ENDOMETRIAL POLYP (ICD-621.0)   UTERINE FIBROID (ICD-218.9)  RHINITIS, ALLERGIC (ICD-477.9)  HYPERTENSION, BENIGN  SYSTEMIC (ICD-401.1)  COPD (ICD-496)  - PFT's 06/26/08 FEV1 1.09 ratio  - PFTs 03/02/10 FEV1 1.06 ratio 34  - PFT's 08/01/2011 FEV1 1.6 (42%)  41 ratio  DLCO  55 corrects 77 - HFA  50% 06/05/2011  > 75% 06/29/2011  - Spiriva trial March 02, 2010 > better but no worse off 04/2011 - Rehab started mid aug 2018  BACK PAIN, LOW (ICD-724.2)  ANEMIA, IRON DEFICIENCY, UNSPEC. (ICD-280.9)  Health Maintenance.......................................................   [facility]   Family History:  crohn`s in daughter, M-lupus, htn, asthma,  no Ca, DM, CAD    Social History:   lives with twin children and grandson. single; >20 pack year history, quit in 2005; no EtOH  Laid off  from works for [employer] Sept 2013           Objective:   Physical Exam wt 166 Oct 12, 2009 >170 October 28, 2010 > 169 06/29/2011 >>171 08/24/2011 > 01/15/2012  171 > 04/24/2012 176 > 06/28/2012  175> 08/13/2012 > 08/30/2012  173 > 170 11/28/2012 > 01/29/2013  176 > 174 >173 06/16/2013 > 171 09/18/2013 > 12/19/2013 177 >179 04/13/2014>  04/23/2014 177 > 08/21/2014 184 >182 11/27/2014 > 05/12/2015 177  > 05/11/2016   177 > 10/27/2016  175 > 11/09/2016 182 > 01/09/2017 198 > 02/20/2017  206 >  07/17/2017   209   amb bf nad / vital signs reviewed   - Note on arrival 02 sats  95% on 2lpm POC pulsed   HEENT: nl  oropharynx. Nl external ear canals without cough reflex - edentulous / moderate bilateral non-specific turbinate edema     NECK :  without JVD/Nodes/TM/ nl carotid upstrokes bilaterally   LUNGS: no acc muscle use, lungs with distant bs bilaterally but no adventitial Breath sounds    CV:  RRR  no s3 or murmur or increase in P2, no edema   ABD:  soft and nontender with nl excursion in the supine position. No bruits or organomegaly, bowel sounds nl  MS:  warm without deformities, calf tenderness, cyanosis or clubbing  SKIN: warm and dry without lesions    NEURO:  alert, approp, no deficits       I personally  reviewed images and agree with radiology impression as follows:  CXR:   03/16/17 1. No acute cardiopulmonary findings. 2. Emphysematous change. 3. RIGHT mid lung scarring and atelectasis.     . Assessment:

## 2017-07-18 ENCOUNTER — Other Ambulatory Visit: Payer: Self-pay | Admitting: Hematology and Oncology

## 2017-07-18 DIAGNOSIS — Z853 Personal history of malignant neoplasm of breast: Secondary | ICD-10-CM

## 2017-07-19 ENCOUNTER — Encounter (HOSPITAL_COMMUNITY): Payer: BLUE CROSS/BLUE SHIELD

## 2017-07-24 ENCOUNTER — Encounter (HOSPITAL_COMMUNITY)
Admission: RE | Admit: 2017-07-24 | Discharge: 2017-07-24 | Disposition: A | Discharge status: Home / Self Care | Payer: BLUE CROSS/BLUE SHIELD | Source: Ambulatory Visit | Attending: Internal Medicine | Admitting: Internal Medicine

## 2017-07-24 VITALS — Wt 209.9 lb

## 2017-07-24 DIAGNOSIS — D860 Sarcoidosis of lung: Secondary | ICD-10-CM

## 2017-07-24 DIAGNOSIS — R0609 Other forms of dyspnea: Secondary | ICD-10-CM | POA: Insufficient documentation

## 2017-07-24 NOTE — Progress Notes (Signed)
Daily Session Note  Patient Details  Name: [name] MRN: [id number] Date of Birth: [birth year] Referring Provider:     Pulmonary Rehab Walk Test from 06/26/2017 in [place]  Referring Provider  Dr. Melvyn Novas      Encounter Date: 07/24/2017  Check In:     Session Check In - 07/24/17 1226      Check-In   Location [facility]   Staff Present Su Hilt, MS, ACSM RCEP, Exercise Physiologist;Portia Rollene Rotunda, RN, BSN;Joann Rion, RN, Marga Melnick, RN, BSN   Supervising physician immediately available to respond to emergencies [facility] immediately available   Physician(s) Dr. Bonner Puna   Medication changes reported     No   Fall or balance concerns reported    No   Tobacco Cessation No Change   Warm-up and Cool-down Performed as group-led instruction   Resistance Training Performed Yes   VAD Patient? No     Pain Assessment   Currently in Pain? No/denies   Multiple Pain Sites No      Capillary Blood Glucose: No results found for this or any previous visit (from the past 24 hour(s)).      Exercise Prescription Changes - 07/24/17 1200      Response to Exercise   Blood Pressure (Admit) 122/62   Blood Pressure (Exercise) 126/82   Blood Pressure (Exit) 102/64   Heart Rate (Admit) 78 bpm   Heart Rate (Exercise) 98 bpm   Heart Rate (Exit) 85 bpm   Oxygen Saturation (Admit) 100 %   Oxygen Saturation (Exercise) 96 %   Oxygen Saturation (Exit) 97 %   Rating of Perceived Exertion (Exercise) 13   Perceived Dyspnea (Exercise) 2   Duration Progress to 45 minutes of aerobic exercise without signs/symptoms of physical distress   Intensity THRR unchanged     Progression   Progression Continue to progress workloads to maintain intensity without signs/symptoms of physical distress.     Resistance Training   Training Prescription Yes   Weight blue bands   Reps 10-15   Time 10 Minutes     Oxygen   Oxygen Continuous   Liters 3     Bike   Level 0.5   Minutes 17     NuStep   Level 4   Minutes 17   METs 2.1      History  Smoking Status  . Former Smoker  . Packs/day: 1.00  . Years: 20.00  . Quit date: 11/14/2003  Smokeless Tobacco  . Never Used    Goals Met:  Exercise tolerated well No report of cardiac concerns or symptoms Strength training completed today  Goals Unmet:  Not Applicable  Comments: Service time is from 10:30a to 12:30p    Dr. Rush Farmer is Medical Director for Pulmonary Rehab at [facility].

## 2017-07-25 DIAGNOSIS — J9611 Chronic respiratory failure with hypoxia: Secondary | ICD-10-CM | POA: Diagnosis not present

## 2017-07-25 DIAGNOSIS — J449 Chronic obstructive pulmonary disease, unspecified: Secondary | ICD-10-CM | POA: Diagnosis not present

## 2017-07-26 ENCOUNTER — Telehealth: Payer: Self-pay | Admitting: Internal Medicine

## 2017-07-26 ENCOUNTER — Encounter (HOSPITAL_COMMUNITY)
Admission: RE | Admit: 2017-07-26 | Discharge: 2017-07-26 | Disposition: A | Discharge status: Home / Self Care | Payer: BLUE CROSS/BLUE SHIELD | Source: Ambulatory Visit | Attending: Internal Medicine | Admitting: Internal Medicine

## 2017-07-26 ENCOUNTER — Encounter (HOSPITAL_COMMUNITY): Payer: BLUE CROSS/BLUE SHIELD

## 2017-07-26 VITALS — Wt 208.8 lb

## 2017-07-26 DIAGNOSIS — D860 Sarcoidosis of lung: Secondary | ICD-10-CM

## 2017-07-26 DIAGNOSIS — R0609 Other forms of dyspnea: Secondary | ICD-10-CM | POA: Diagnosis not present

## 2017-07-26 NOTE — Progress Notes (Signed)
Daily Session Note  Patient Details  Name: [name] MRN: [id number] Date of Birth: September 07, 1961 Referring Provider:     Pulmonary Rehab Walk Test from 06/26/2017 in [place]  Referring Provider  Dr. Melvyn Novas      Encounter Date: 07/26/2017  Check In:     Session Check In - 07/26/17 1200      Check-In   [facility]   Staff Present Su Hilt, MS, ACSM RCEP, Exercise Physiologist;Portia Rollene Rotunda, RN, BSN   Supervising physician immediately available to respond to emergencies [facility] immediately available   Physician(s) Dr. Carolin Sicks   Medication changes reported     No   Fall or balance concerns reported    No   Tobacco Cessation No Change   Warm-up and Cool-down Performed as group-led instruction   Resistance Training Performed Yes   VAD Patient? No     Pain Assessment   Currently in Pain? No/denies   Multiple Pain Sites No      Capillary Blood Glucose: No results found for this or any previous visit (from the past 24 hour(s)).      Exercise Prescription Changes - 07/26/17 1200      Response to Exercise   Blood Pressure (Admit) 146/76   Blood Pressure (Exercise) 150/76   Blood Pressure (Exit) 122/80   Heart Rate (Admit) 78 bpm   Heart Rate (Exercise) 107 bpm   Heart Rate (Exit) 90 bpm   Oxygen Saturation (Admit) 93 %   Oxygen Saturation (Exercise) 88 %   Oxygen Saturation (Exit) 97 %   Rating of Perceived Exertion (Exercise) 13   Perceived Dyspnea (Exercise) 2   Duration Progress to 45 minutes of aerobic exercise without signs/symptoms of physical distress   Intensity THRR unchanged     Progression   Progression Continue to progress workloads to maintain intensity without signs/symptoms of physical distress.     Resistance Training   Training Prescription Yes   Weight blue bands   Reps 10-15   Time 10 Minutes     Oxygen   Oxygen Continuous   Liters 3     Bike   Level 0.5   Minutes 17     NuStep   Level 4   Minutes 17   METs 1.9     Track   Laps 9   Minutes 17      History  Smoking Status  . Former Smoker  . Packs/day: 1.00  . Years: 20.00  . Quit date: 11/14/2003  Smokeless Tobacco  . Never Used    Goals Met:  Exercise tolerated well No report of cardiac concerns or symptoms Strength training completed today  Goals Unmet:  Not Applicable  Comments: Service time is from 10:30a to 12:00p    Dr. Rush Farmer is Medical Director for Pulmonary Rehab at [facility]

## 2017-07-26 NOTE — Telephone Encounter (Signed)
Placed in MW's lookat

## 2017-07-30 NOTE — Telephone Encounter (Signed)
Rec'd completed paperwork- fwd to [employer] via interoffice mail 07/30/17 -pr

## 2017-07-31 ENCOUNTER — Encounter (HOSPITAL_COMMUNITY)
Admission: RE | Admit: 2017-07-31 | Discharge: 2017-07-31 | Disposition: A | Discharge status: Home / Self Care | Payer: BLUE CROSS/BLUE SHIELD | Source: Ambulatory Visit | Attending: Internal Medicine | Admitting: Internal Medicine

## 2017-07-31 DIAGNOSIS — R0609 Other forms of dyspnea: Secondary | ICD-10-CM | POA: Diagnosis not present

## 2017-07-31 DIAGNOSIS — D860 Sarcoidosis of lung: Secondary | ICD-10-CM

## 2017-07-31 NOTE — Progress Notes (Signed)
[name] 56 y.o. female  DOB: February 26, 1961 MRN: [id number]           Nutrition Note Dx: Sarcoidosis Nutrition Note Spoke with pt. Nutrition plan and survey reviewed with pt. There are some ways the pt can make her eating habits healthier according to pt's Rate Your Plate results. Pt has made most of the changes recommended for healthier eating habits. Barriers to healthy eating include pt's difficulty buying food and lack of transportation to get food. Pt wants to lose wt due to reported 40 lb wt gain on prednisone. Pt has been trying to lose wt by using an app on her phone/following a 1400 kcal diet. Per discussion, pt states she has a hard time getting 1400 kcal/d. Wt loss tips reviewed. Pt is on Deltasone. Pt states her calcium supplement was stopped due to hypercalcemia. Pt expressed understanding of the information reviewed. Pt aware of nutrition education classes offered.  Nutrition Intervention ? Pt's individual nutrition plan and goals reviewed with pt. ? Pt encouraged to discuss vitamin D supplement with her MD at her appointment on Fri 08/03/17 ? Pt given handouts for: ?  5-day, 1200 kcal menu ideas ? Community Food Resources  Nutrition Diagnosis ? Food-and nutrition-related knowledge deficit related to lack of exposure to information as related to diagnosis of pulmonary disease ? Obesity related to excessive energy intake as evidenced by a BMI of 35.0   Goal(s) 1. Identify food quantities necessary to achieve wt loss of 1-2 lb per week to a goal wt loss of 2.7-10.9 kg (6-24 lb) at graduation from pulmonary rehab. 2. Describe the benefit of including fruits, vegetables, whole grains, and low-fat dairy products in a healthy meal plan.  Plan:  Pt to attend Pulmonary Nutrition class - met 07/05/17 Will provide client-centered nutrition education as part of interdisciplinary care.   Monitor and evaluate progress toward nutrition goal with team.  Monitor and Evaluate progress toward  nutrition goal with team.   Derek Mound, M.Ed, RD, LDN, CDE 07/31/2017 11:53 AM

## 2017-07-31 NOTE — Progress Notes (Signed)
Daily Session Note  Patient Details  Name: [name] MRN: [id number] Date of Birth: 1961/05/01 Referring Provider:     Pulmonary Rehab Walk Test from 06/26/2017 in [place]  Referring Provider  Dr. Melvyn Novas      Encounter Date: 07/31/2017  Check In:     Session Check In - 07/31/17 1030      Check-In   Location [facility]   Staff Present Rodney Langton, RN;Molly diVincenzo, MS, ACSM RCEP, Exercise Physiologist;Portia Rollene Rotunda, RN, BSN   Supervising physician immediately available to respond to emergencies [facility] immediately available   Physician(s) Dr. Carolin Sicks   Medication changes reported     No   Fall or balance concerns reported    No   Tobacco Cessation No Change   Warm-up and Cool-down Performed as group-led instruction   Resistance Training Performed Yes   VAD Patient? No     Pain Assessment   Currently in Pain? No/denies   Multiple Pain Sites No      Capillary Blood Glucose: No results found for this or any previous visit (from the past 24 hour(s)).      Exercise Prescription Changes - 07/31/17 1600      Response to Exercise   Blood Pressure (Admit) 102/70   Blood Pressure (Exercise) 136/74   Blood Pressure (Exit) 120/70   Heart Rate (Admit) 85 bpm   Heart Rate (Exercise) 93 bpm   Heart Rate (Exit) 82 bpm   Oxygen Saturation (Admit) 96 %   Oxygen Saturation (Exercise) 88 %   Oxygen Saturation (Exit) 97 %   Rating of Perceived Exertion (Exercise) 12   Perceived Dyspnea (Exercise) 2   Duration Progress to 45 minutes of aerobic exercise without signs/symptoms of physical distress   Intensity THRR unchanged     Progression   Progression Continue to progress workloads to maintain intensity without signs/symptoms of physical distress.     Resistance Training   Training Prescription Yes   Weight b;ue bands   Reps 10-15   Time 10 Minutes     Bike   Level 0.5   Minutes 17     NuStep   Level 4    Minutes 17   METs 1.9      History  Smoking Status  . Former Smoker  . Packs/day: 1.00  . Years: 20.00  . Quit date: 11/14/2003  Smokeless Tobacco  . Never Used    Goals Met:  Exercise tolerated well No report of cardiac concerns or symptoms Strength training completed today  Goals Unmet:  Not Applicable  Comments: Service time is from 1030 to 1205    Dr. Rush Farmer is Medical Director for Pulmonary Rehab at [facility].

## 2017-08-02 ENCOUNTER — Encounter (HOSPITAL_COMMUNITY)
Admission: RE | Admit: 2017-08-02 | Discharge: 2017-08-02 | Disposition: A | Discharge status: Home / Self Care | Payer: BLUE CROSS/BLUE SHIELD | Source: Ambulatory Visit | Attending: Internal Medicine | Admitting: Internal Medicine

## 2017-08-02 VITALS — Wt 208.8 lb

## 2017-08-02 DIAGNOSIS — D860 Sarcoidosis of lung: Secondary | ICD-10-CM

## 2017-08-02 DIAGNOSIS — R0609 Other forms of dyspnea: Secondary | ICD-10-CM | POA: Diagnosis not present

## 2017-08-02 NOTE — Progress Notes (Signed)
Daily Session Note  Patient Details  Name: [name] MRN: [id number] Date of Birth: Apr 26, 1961 Referring Provider:     Pulmonary Rehab Walk Test from 06/26/2017 in [place]  Referring Provider  Dr. Melvyn Novas      Encounter Date: 08/02/2017  Check In:     Session Check In - 08/02/17 1055      Check-In   [facility]   Staff Present Su Hilt, MS, ACSM RCEP, Exercise Physiologist;Portia Rollene Rotunda, RN, BSN   Supervising physician immediately available to respond to emergencies [facility] immediately available   Physician(s) Dr. Wynelle Cleveland   Medication changes reported     No   Fall or balance concerns reported    No   Tobacco Cessation No Change   Warm-up and Cool-down Performed as group-led instruction   Resistance Training Performed Yes   VAD Patient? No     Pain Assessment   Currently in Pain? No/denies   Multiple Pain Sites No      Capillary Blood Glucose: No results found for this or any previous visit (from the past 24 hour(s)).      Exercise Prescription Changes - 08/02/17 1200      Response to Exercise   Blood Pressure (Admit) 122/76   Blood Pressure (Exercise) 130/64   Blood Pressure (Exit) 112/70   Heart Rate (Admit) 80 bpm   Heart Rate (Exercise) 110 bpm   Heart Rate (Exit) 81 bpm   Oxygen Saturation (Admit) 96 %   Oxygen Saturation (Exercise) 91 %   Oxygen Saturation (Exit) 98 %   Rating of Perceived Exertion (Exercise) 13   Perceived Dyspnea (Exercise) 2   Duration Progress to 45 minutes of aerobic exercise without signs/symptoms of physical distress   Intensity THRR unchanged     Progression   Progression Continue to progress workloads to maintain intensity without signs/symptoms of physical distress.     Resistance Training   Training Prescription Yes   Weight b;ue bands   Reps 10-15   Time 10 Minutes     Oxygen   Oxygen Continuous   Liters 4     Bike   Level 0.5   Minutes  17     Track   Laps 12   Minutes 17      History  Smoking Status  . Former Smoker  . Packs/day: 1.00  . Years: 20.00  . Quit date: 11/14/2003  Smokeless Tobacco  . Never Used    Goals Met:  Exercise tolerated well No report of cardiac concerns or symptoms Strength training completed today  Goals Unmet:  Not Applicable  Comments: Service time is from 10:30a to 12:20p    Dr. Rush Farmer is Medical Director for Pulmonary Rehab at [facility]

## 2017-08-03 ENCOUNTER — Ambulatory Visit (INDEPENDENT_AMBULATORY_CARE_PROVIDER_SITE_OTHER): Payer: BLUE CROSS/BLUE SHIELD | Admitting: Internal Medicine

## 2017-08-03 VITALS — BP 120/70 | Ht 65.0 in | Wt 209.0 lb

## 2017-08-03 DIAGNOSIS — K1370 Unspecified lesions of oral mucosa: Secondary | ICD-10-CM

## 2017-08-03 MED ORDER — IBUPROFEN 600 MG PO TABS: 600.0000 mg | ORAL_TABLET | Freq: Three times a day (TID) | ORAL | 0 refills | Status: DC | PRN

## 2017-08-03 NOTE — Progress Notes (Signed)
   Subjective:    BOBETTA KORF - 56 y.o. female MRN [id number]  Date of birth: 07-30-1961  HPI  ADAH STONEBERG is here for blisters in mouth. Reports they have been present for about 3-4 days. Present on bottom lip, upper lip, and on sides of mouth. They are not painful. She does not believe they have drained. She has been afebrile and without nausea and vomiting. No sore throat. Eating and drinking normally. 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 Uses blistex for this. Remote smoking history, quit >10 years ago. Never used chewing tobacco. No changes in medications or recent antibiotic use. Takes chronic steroids for Sarcoidosis.   -  reports that she quit smoking about 13 years ago. She has a 20.00 pack-year smoking history. She has never used smokeless tobacco. - Review of Systems: Per HPI. - Past Medical History: Patient Active Problem List   Diagnosis Date Noted  . Knee pain 03/29/2017  . Right ankle pain 03/29/2017  . Obesity (BMI 30-39.9) 01/10/2017  . Dyspnea on exertion 11/09/2016  . Chronic respiratory failure with hypoxia and hypercapnia ([place]) 10/29/2016  . Malnutrition of moderate degree 10/10/2016  . Acute on chronic respiratory failure ([place]) 10/09/2016  . Difficulty in walking, not elsewhere classified   . Depression with anxiety 08/07/2016  . Subacromial impingement of left shoulder 07/19/2016  . Overweight (BMI 25.0-29.9) 05/11/2016  . Hot flashes 02/01/2016  . Constipation 01/13/2016  . Breast cancer of upper-outer quadrant of right female breast ([place]) 08/04/2015  . Tibial pain 06/03/2015  . Rotator cuff impingement syndrome 09/14/2014  . Menopausal syndrome (hot flashes) 01/19/2014  . GERD (gastroesophageal reflux disease) 12/21/2012  . Hyperlipidemia 12/21/2012  . SICKLE-CELL TRAIT 04/15/2008  . Sarcoidosis ([place]) 01/10/2007  . ANEMIA, IRON DEFICIENCY, UNSPEC. 01/10/2007  . HYPERTENSION, BENIGN SYSTEMIC 01/10/2007  . COPD  GOLD III  01/10/2007   - Medications: reviewed and updated   Objective:   Physical Exam BP 120/70   Ht 5\' 5"  (1.651 m)   Wt 209 lb (94.8 kg)   BMI 34.78 kg/m  Gen: NAD, alert, cooperative with exam, well-appearing HEENT: white ruptured vesicles present on erythematous base on the upper and lower lip near [place] border, vesicles present on buccal mucosa on either side of mouth, no lesions present on tongue, oropharynx clear      Assessment & Plan:   1. 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 If lesions persist, would recommend biopsy to ensure not cancerous given history of smoking. Since lesions not painful, did not prescribe any medications today. Patient can continue to use salt water rinses prn. Patient seen by Dr. McDiarmid who helped develop this plan.   Phill Myron, D.O. 08/03/2017, 10:02 AM PGY-3, [place]

## 2017-08-03 NOTE — Patient Instructions (Signed)
Continue to use the warm salt water rinses. Avoid mouthwash as the alcohol content will likely be irritating. If these spots worsen or do not clear up in the next couple of weeks, please return.

## 2017-08-03 NOTE — Progress Notes (Signed)
Pulmonary Individual Treatment Plan  Patient Details  Name: [name] MRN: [id number] Date of Birth: 02-Oct-1961 Referring Provider:     Pulmonary Rehab Walk Test from 06/26/2017 in [place]  Referring Provider  Dr. Melvyn Novas      Initial Encounter Date:    Pulmonary Rehab Walk Test from 06/26/2017 in [place]  Date  06/26/17  Referring Provider  Dr. Melvyn Novas      Visit Diagnosis: Sarcoidosis of lung ([place])  Patient's Home Medications on Admission:   Current Outpatient Prescriptions:  .  acetaminophen (TYLENOL) 500 MG tablet, Take 1,000 mg by mouth every 6 (six) hours as needed for fever. Reported on 05/10/2016, Disp: , Rfl:  .  albuterol (PROVENTIL HFA;VENTOLIN HFA) 108 (90 Base) MCG/ACT inhaler, Inhale 2 puffs into the lungs every 6 (six) hours as needed for wheezing or shortness of breath., Disp: 1 Inhaler, Rfl: 5 .  albuterol (VENTOLIN HFA) 108 (90 Base) MCG/ACT inhaler, Inhale 2 puffs into the lungs every 6 (six) hours as needed for wheezing or shortness of breath., Disp: 1 Inhaler, Rfl: 2 .  anastrozole (ARIMIDEX) 1 MG tablet, Take 1 tablet (1 mg total) by mouth at bedtime., Disp: 90 tablet, Rfl: 3 .  atorvastatin (LIPITOR) 20 MG tablet, Take 1 tablet (20 mg total) by mouth daily., Disp: 90 tablet, Rfl: 3 .  budesonide-formoterol (SYMBICORT) 160-4.5 MCG/ACT inhaler, Inhale 2 puffs into the lungs 2 (two) times daily., Disp: 1 Inhaler, Rfl: 2 .  diphenhydrAMINE (BENADRYL) 25 MG tablet, Take 1 tablet (25 mg total) by mouth every 6 (six) hours as needed., Disp: 30 tablet, Rfl: 0 .  fluticasone (FLONASE) 50 MCG/ACT nasal spray, Place 2 sprays into both nostrils daily as needed for allergies., Disp: 16 g, Rfl: 11 .  gabapentin (NEURONTIN) 300 MG capsule, Take 1 capsule (300 mg total) by mouth at bedtime., Disp: 90 capsule, Rfl: 3 .  hydrochlorothiazide (HYDRODIURIL) 12.5 MG tablet, , Disp: , Rfl: 0 .  ibuprofen (ADVIL,MOTRIN)  600 MG tablet, Take 1 tablet (600 mg total) by mouth every 8 (eight) hours as needed., Disp: 30 tablet, Rfl: 0 .  metoprolol tartrate (LOPRESSOR) 25 MG tablet, Take 1 tablet (25 mg total) by mouth 2 (two) times daily., Disp: 60 tablet, Rfl: 11 .  Multiple Vitamin (MULTIVITAMIN) capsule, Take 1 capsule by mouth daily., Disp: , Rfl:  .  olopatadine (PATANOL) 0.1 % ophthalmic solution, , Disp: , Rfl: 1 .  omeprazole (PRILOSEC) 20 MG capsule, TAKE ONE CAPSULE BY MOUTH  DAILY, Disp: 30 capsule, Rfl: 5 .  oxymetazoline (AFRIN NASAL SPRAY) 0.05 % nasal spray, Place 1 spray into both nostrils 2 (two) times daily., Disp: 30 mL, Rfl: 0 .  predniSONE (DELTASONE) 5 MG tablet, Take 2.5 mg by mouth daily with breakfast. Take 1/2 tablet daily (2.63m total), Disp: , Rfl:  .  triamterene-hydrochlorothiazide (MAXZIDE-25) 37.5-25 MG tablet, TAKE ONE TABLET BY MOUTH ONCE DAILY, Disp: 30 tablet, Rfl: 11  Past Medical History: Past Medical History:  Diagnosis Date  . Allergic rhinitis   . COPD, mild [place] BY DR WMelvyn Novas . GERD (gastroesophageal reflux disease)   . HTN (hypertension)   . Hypertension   . Iron deficiency anemia   . Long-term current use of steroids SYMBICORT INHALER  . 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 Sarcoidosis STABLE PER CXR JUNE 2013  . Shortness of breath   . Sickle cell trait (H[facility])     Tobacco Use: History  Smoking Status  . Former Smoker  . Packs/day: 1.00  . Years: 20.00  . Quit date: 11/14/2003  Smokeless Tobacco  . Never Used    Labs: Recent Review Flowsheet Data    Labs for ITP Cardiac and Pulmonary Rehab Latest Ref Rng & Units 12/20/2012 09/10/2013 02/04/2015 08/13/2015 02/05/2017   Cholestrol 100 - 199 mg/dL 187 - 178 - 247(H)   LDLCALC 0 - 99 mg/dL 106(H) - 118(H) - 131(H)   HDL >39 mg/dL 53 - 43(L) - 80   Trlycerides 0 - 149 mg/dL 139 - 84 - 178(H)   Hemoglobin A1c - - 5.9 5.9 - -   TCO2 0 - 100 mmol/L - - - 30 -      Capillary Blood Glucose: Lab Results  Component Value  Date   GLUCAP 83 07/19/2012     Pulmonary Assessment Scores:     Pulmonary Assessment Scores    Row Name 06/21/17 1113         ADL UCSD   ADL Phase Entry     SOB Score total 53       CAT Score   CAT Score 11  Entry        Pulmonary Function Assessment:     Pulmonary Function Assessment - 06/18/17 1436      Breath   Bilateral Breath Sounds Clear;Decreased   Shortness of Breath Yes;Limiting activity      Exercise Target Goals:    Exercise Program Goal: Individual exercise prescription set with THRR, safety & activity barriers. Participant demonstrates ability to understand and report RPE using BORG scale, to self-measure pulse accurately, and to acknowledge the importance of the exercise prescription.  Exercise Prescription Goal: Starting with aerobic activity 30 plus minutes a day, 3 days per week for initial exercise prescription. Provide home exercise prescription and guidelines that participant acknowledges understanding prior to discharge.  Activity Barriers & Risk Stratification:   6 Minute Walk:     6 Minute Walk    Row Name 06/26/17 1634         6 Minute Walk   Phase Initial     Distance 1037 feet     Walk Time 5 minutes     # of Rest Breaks 1  1 minute     MPH 1.96     METS 2.53     RPE 11     Perceived Dyspnea  2     Symptoms No     Resting HR 78 bpm     Resting BP 120/79     Max Ex. HR 118 bpm     Max Ex. BP 175/78     2 Minute Post BP 122/85       Interval HR   Baseline HR (retired) 78     1 Minute HR 97     2 Minute HR 106     3 Minute HR 86     4 Minute HR 101     5 Minute HR 118     6 Minute HR 118     2 Minute Post HR 88     Interval Heart Rate? Yes       Interval Oxygen   Interval Oxygen? Yes     Baseline Oxygen Saturation % 94 %     Resting Liters of Oxygen 2 L     1 Minute Oxygen Saturation % 91 %     1 Minute Liters of Oxygen 2 L  2 Minute Oxygen Saturation % 87 %     2 Minute Liters of Oxygen 2 L     3  Minute Oxygen Saturation % 93 %     3 Minute Liters of Oxygen 3 L     4 Minute Oxygen Saturation % 92 %     4 Minute Liters of Oxygen 3 L     5 Minute Oxygen Saturation % 89 %     5 Minute Liters of Oxygen 3 L     6 Minute Oxygen Saturation % 89 %     6 Minute Liters of Oxygen 3 L     2 Minute Post Oxygen Saturation % 96 %     2 Minute Post Liters of Oxygen 3 L        Oxygen Initial Assessment:     Oxygen Initial Assessment - 06/26/17 1633      Initial 6 min Walk   Oxygen Used Continuous;E-Tanks   Liters per minute 3   Resting Oxygen Saturation  93 %   Exercise Oxygen Saturation  during 6 min walk 87 %  87 on 2 liters, titrated to 3 liters     Program Oxygen Prescription   Program Oxygen Prescription Continuous;E-Tanks   Liters per minute 3      Oxygen Re-Evaluation:     Oxygen Re-Evaluation    Row Name 07/11/17 1713 08/03/17 1150           Program Oxygen Prescription   Program Oxygen Prescription Continuous;E-Tanks Continuous;E-Tanks      Liters per minute 3 3        Home Oxygen   Home Oxygen Device Portable Concentrator;Home Concentrator Portable Concentrator;Home Concentrator      Sleep Oxygen Prescription Continuous Continuous      Liters per minute 2 2      Home Exercise Oxygen Prescription Continuous Continuous      Liters per minute 2 3      Home at Rest Exercise Oxygen Prescription Continuous Continuous      Liters per minute 2 2      Compliance with Home Oxygen Use Yes Yes        Goals/Expected Outcomes   Short Term Goals To learn and exhibit compliance with exercise, home and travel O2 prescription;To learn and understand importance of monitoring SPO2 with pulse oximeter and demonstrate accurate use of the pulse oximeter.;To learn and understand importance of maintaining oxygen saturations>88%;To learn and demonstrate proper pursed lip breathing techniques or other breathing techniques.;To learn and demonstrate proper use of respiratory medications To  learn and exhibit compliance with exercise, home and travel O2 prescription;To learn and understand importance of monitoring SPO2 with pulse oximeter and demonstrate accurate use of the pulse oximeter.;To learn and understand importance of maintaining oxygen saturations>88%;To learn and demonstrate proper pursed lip breathing techniques or other breathing techniques.;To learn and demonstrate proper use of respiratory medications      Long  Term Goals Exhibits compliance with exercise, home and travel O2 prescription;Verbalizes importance of monitoring SPO2 with pulse oximeter and return demonstration;Maintenance of O2 saturations>88%;Exhibits proper breathing techniques, such as pursed lip breathing or other method taught during program session;Compliance with respiratory medication;Demonstrates proper use of MDI's Exhibits compliance with exercise, home and travel O2 prescription;Verbalizes importance of monitoring SPO2 with pulse oximeter and return demonstration;Maintenance of O2 saturations>88%;Exhibits proper breathing techniques, such as pursed lip breathing or other method taught during program session;Compliance with respiratory medication;Demonstrates proper use of MDI's  Goals/Expected Outcomes  - patient compliant with home oxygen requirements and meets goals as stated above         Oxygen Discharge (Final Oxygen Re-Evaluation):     Oxygen Re-Evaluation - 08/03/17 1150      Program Oxygen Prescription   Program Oxygen Prescription Continuous;E-Tanks   Liters per minute 3     Home Oxygen   Home Oxygen Device Portable Concentrator;Home Concentrator   Sleep Oxygen Prescription Continuous   Liters per minute 2   Home Exercise Oxygen Prescription Continuous   Liters per minute 3   Home at Rest Exercise Oxygen Prescription Continuous   Liters per minute 2   Compliance with Home Oxygen Use Yes     Goals/Expected Outcomes   Short Term Goals To learn and exhibit compliance with  exercise, home and travel O2 prescription;To learn and understand importance of monitoring SPO2 with pulse oximeter and demonstrate accurate use of the pulse oximeter.;To learn and understand importance of maintaining oxygen saturations>88%;To learn and demonstrate proper pursed lip breathing techniques or other breathing techniques.;To learn and demonstrate proper use of respiratory medications   Long  Term Goals Exhibits compliance with exercise, home and travel O2 prescription;Verbalizes importance of monitoring SPO2 with pulse oximeter and return demonstration;Maintenance of O2 saturations>88%;Exhibits proper breathing techniques, such as pursed lip breathing or other method taught during program session;Compliance with respiratory medication;Demonstrates proper use of MDI's   Goals/Expected Outcomes patient compliant with home oxygen requirements and meets goals as stated above      Initial Exercise Prescription:     Initial Exercise Prescription - 06/26/17 1600      Date of Initial Exercise RX and Referring Provider   Date 06/26/17   Referring Provider Dr. Melvyn Novas     Oxygen   Oxygen Continuous   Liters 3     Bike   Level 0.5   Minutes 17     NuStep   Level 2   Minutes 17   METs 1.4     Track   Laps 5   Minutes 17     Prescription Details   Frequency (times per week) 2   Duration Progress to 45 minutes of aerobic exercise without signs/symptoms of physical distress     Intensity   THRR 40-80% of Max Heartrate 65-131   Ratings of Perceived Exertion 11-13   Perceived Dyspnea 0-4     Progression   Progression Continue progressive overload as per policy without signs/symptoms or physical distress.     Resistance Training   Training Prescription Yes   Weight blue bands   Reps 10-15      Perform Capillary Blood Glucose checks as needed.  Exercise Prescription Changes:     Exercise Prescription Changes    Row Name 07/03/17 1200 07/05/17 1200 07/10/17 1200  07/12/17 1200 07/24/17 1200     Response to Exercise   Blood Pressure (Admit) 118/68 138/80 110/68 132/60 122/62   Blood Pressure (Exercise) 126/72 134/84 132/68 130/70 126/82   Blood Pressure (Exit) 114/70 110/70 104/60 116/70 102/64   Heart Rate (Admit) 72 bpm 65 bpm 78 bpm 67 bpm 78 bpm   Heart Rate (Exercise) 104 bpm 104 bpm 108 bpm 95 bpm 98 bpm   Heart Rate (Exit) 75 bpm 80 bpm 71 bpm 67 bpm 85 bpm   Oxygen Saturation (Admit) 92 % 96 % 98 % 98 % 100 %   Oxygen Saturation (Exercise) 89 % 90 % 87 % 89 % 96 %   Oxygen Saturation (  Exit) 99 % 98 % 98 % 99 % 97 %   Rating of Perceived Exertion (Exercise) 11 11 13 13 13    Perceived Dyspnea (Exercise) 2 1 3 2 2    Duration Progress to 45 minutes of aerobic exercise without signs/symptoms of physical distress Progress to 45 minutes of aerobic exercise without signs/symptoms of physical distress Progress to 45 minutes of aerobic exercise without signs/symptoms of physical distress Progress to 45 minutes of aerobic exercise without signs/symptoms of physical distress Progress to 45 minutes of aerobic exercise without signs/symptoms of physical distress   Intensity Other (comment)  40-80% of HRR Other (comment)  40-80% of HRR Other (comment)  40-80% of HRR THRR unchanged THRR unchanged     Progression   Progression Continue to progress workloads to maintain intensity without signs/symptoms of physical distress. Continue to progress workloads to maintain intensity without signs/symptoms of physical distress. Continue to progress workloads to maintain intensity without signs/symptoms of physical distress. Continue to progress workloads to maintain intensity without signs/symptoms of physical distress. Continue to progress workloads to maintain intensity without signs/symptoms of physical distress.     Resistance Training   Training Prescription Yes Yes Yes Yes Yes   Weight blue bands blue bands blue bands blue bands blue bands   Reps 10-15 10-15  10-15 10-15 10-15   Time 10 Minutes 10 Minutes 10 Minutes 10 Minutes 10 Minutes     Oxygen   Oxygen Continuous Continuous Continuous Continuous Continuous   Liters 3 3 3 3 3      Bike   Level 0.5  - 0.5 0.5 0.5   Minutes 17  - 17 17 17      NuStep   Level 2 2 3   - 4   Minutes 17 17 17   - 17   METs 1.9 2.1 2.1  - 2.1     Track   Laps 5 12 11 11   -   Minutes 17 17 17 17   -   Row Name 07/26/17 1200 07/31/17 1600 08/02/17 1200         Response to Exercise   Blood Pressure (Admit) 146/76 102/70 122/76     Blood Pressure (Exercise) 150/76 136/74 130/64     Blood Pressure (Exit) 122/80 120/70 112/70     Heart Rate (Admit) 78 bpm 85 bpm 80 bpm     Heart Rate (Exercise) 107 bpm 93 bpm 110 bpm     Heart Rate (Exit) 90 bpm 82 bpm 81 bpm     Oxygen Saturation (Admit) 93 % 96 % 96 %     Oxygen Saturation (Exercise) 88 % 88 % 91 %     Oxygen Saturation (Exit) 97 % 97 % 98 %     Rating of Perceived Exertion (Exercise) 13 12 13      Perceived Dyspnea (Exercise) 2 2 2      Duration Progress to 45 minutes of aerobic exercise without signs/symptoms of physical distress Progress to 45 minutes of aerobic exercise without signs/symptoms of physical distress Progress to 45 minutes of aerobic exercise without signs/symptoms of physical distress     Intensity THRR unchanged THRR unchanged THRR unchanged       Progression   Progression Continue to progress workloads to maintain intensity without signs/symptoms of physical distress. Continue to progress workloads to maintain intensity without signs/symptoms of physical distress. Continue to progress workloads to maintain intensity without signs/symptoms of physical distress.       Resistance Training   Training Prescription Yes Yes Yes  Weight blue bands b;ue bands b;ue bands     Reps 10-15 10-15 10-15     Time 10 Minutes 10 Minutes 10 Minutes       Oxygen   Oxygen Continuous  - Continuous     Liters 3  - 4       Bike   Level 0.5 0.5 0.5      Minutes 17 17 17        NuStep   Level 4 4  -     Minutes 17 17  -     METs 1.9 1.9  -       Track   Laps 9  - 12     Minutes 17  - 17        Exercise Comments:   Exercise Goals and Review:     Exercise Goals    Row Name 06/18/17 1420             Exercise Goals   Increase Physical Activity Yes       Intervention Provide advice, education, support and counseling about physical activity/exercise needs.;Develop an individualized exercise prescription for aerobic and resistive training based on initial evaluation findings, risk stratification, comorbidities and participant's personal goals.       Expected Outcomes Achievement of increased cardiorespiratory fitness and enhanced flexibility, muscular endurance and strength shown through measurements of functional capacity and personal statement of participant.       Increase Strength and Stamina Yes       Intervention Provide advice, education, support and counseling about physical activity/exercise needs.;Develop an individualized exercise prescription for aerobic and resistive training based on initial evaluation findings, risk stratification, comorbidities and participant's personal goals.       Expected Outcomes Achievement of increased cardiorespiratory fitness and enhanced flexibility, muscular endurance and strength shown through measurements of functional capacity and personal statement of participant.          Exercise Goals Re-Evaluation :     Exercise Goals Re-Evaluation    Row Name 07/10/17 8938 07/30/17 1214           Exercise Goal Re-Evaluation   Exercise Goals Review Increase Physical Activity;Increase Strength and Stamina Increase Physical Activity;Increase Strength and Stamina;Able to understand and use Dyspnea scale;Able to understand and use rate of perceived exertion (RPE) scale;Knowledge and understanding of Target Heart Rate Range (THRR);Understanding of Exercise Prescription      Comments Patient has  only attended two exercise sessions. Will cont. to monitor and progress as able.  Patient is slowly progressing in program. Very excited when lost 1.1 pounds during last session. Very proud of her achievement. States that she is watching her diet. Will complete home exercise.        Expected Outcomes Through exercise and education at rehab and at home, patient will increase strength and stamina. Patient will also gain a better understanding of the need for physical activity on a daily basis and the effects it can have on quality of life. Through exercise and education at rehab and at home, patient will increase strength and stamina. Patient will also gain a better understanding of the need for physical activity on a daily basis and the effects it can have on quality of life.         Discharge Exercise Prescription (Final Exercise Prescription Changes):     Exercise Prescription Changes - 08/02/17 1200      Response to Exercise   Blood Pressure (Admit) 122/76   Blood Pressure (Exercise)  130/64   Blood Pressure (Exit) 112/70   Heart Rate (Admit) 80 bpm   Heart Rate (Exercise) 110 bpm   Heart Rate (Exit) 81 bpm   Oxygen Saturation (Admit) 96 %   Oxygen Saturation (Exercise) 91 %   Oxygen Saturation (Exit) 98 %   Rating of Perceived Exertion (Exercise) 13   Perceived Dyspnea (Exercise) 2   Duration Progress to 45 minutes of aerobic exercise without signs/symptoms of physical distress   Intensity THRR unchanged     Progression   Progression Continue to progress workloads to maintain intensity without signs/symptoms of physical distress.     Resistance Training   Training Prescription Yes   Weight b;ue bands   Reps 10-15   Time 10 Minutes     Oxygen   Oxygen Continuous   Liters 4     Bike   Level 0.5   Minutes 17     Track   Laps 12   Minutes 17      Nutrition:  Target Goals: Understanding of nutrition guidelines, daily intake of sodium <1587m, cholesterol <2040m calories  30% from fat and 7% or less from saturated fats, daily to have 5 or more servings of fruits and vegetables.  Biometrics:     Pre Biometrics - 06/18/17 1447      Pre Biometrics   Grip Strength 29 kg       Nutrition Therapy Plan and Nutrition Goals:     Nutrition Therapy & Goals - 07/09/17 1515      Nutrition Therapy   Diet Therapeutic Lifestyle Changes     Personal Nutrition Goals   Nutrition Goal Identify food quantities necessary to achieve wt loss of 1-2 lb per week to a goal wt loss of 2.7-10.9 kg (6-24 lb) at graduation from pulmonary rehab.   Personal Goal #2 Describe the benefit of including fruits, vegetables, whole grains, and low-fat dairy products in a healthy meal plan.     Intervention Plan   Intervention Prescribe, educate and counsel regarding individualized specific dietary modifications aiming towards targeted core components such as weight, hypertension, lipid management, diabetes, heart failure and other comorbidities.   Expected Outcomes Short Term Goal: Understand basic principles of dietary content, such as calories, fat, sodium, cholesterol and nutrients.;Long Term Goal: Adherence to prescribed nutrition plan.      Nutrition Discharge: Rate Your Plate Scores:     Nutrition Assessments - 07/09/17 1515      Rate Your Plate Scores   Pre Score 52      Nutrition Goals Re-Evaluation:   Nutrition Goals Discharge (Final Nutrition Goals Re-Evaluation):   Psychosocial: Target Goals: Acknowledge presence or absence of significant depression and/or stress, maximize coping skills, provide positive support system. Participant is able to verbalize types and ability to use techniques and skills needed for reducing stress and depression.  Initial Review & Psychosocial Screening:     Initial Psych Review & Screening - 06/18/17 1438      Initial Review   Current issues with Current Sleep Concerns  sleep deprevation from hot flashes     Family Dynamics    Good Support System? Yes   Comments recent loss of mother     Barriers   Psychosocial barriers to participate in program There are no identifiable barriers or psychosocial needs.     Screening Interventions   Interventions Encouraged to exercise      Quality of Life Scores:   PHQ-9: Recent Review Flowsheet Data    Depression screen PHQ  2/9 06/18/2017 04/12/2017 03/29/2017 03/13/2017 02/05/2017   Decreased Interest 0 0 0 0 0   Down, Depressed, Hopeless 0 0 0 0 0   PHQ - 2 Score 0 0 0 0 0     Interpretation of Total Score  Total Score Depression Severity:  1-4 = Minimal depression, 5-9 = Mild depression, 10-14 = Moderate depression, 15-19 = Moderately severe depression, 20-27 = Severe depression   Psychosocial Evaluation and Intervention:     Psychosocial Evaluation - 06/18/17 1440      Psychosocial Evaluation & Interventions   Interventions Encouraged to exercise with the program and follow exercise prescription   Expected Outcomes patient will remain free from psychosocial barriers   Continue Psychosocial Services  Follow up required by staff      Psychosocial Re-Evaluation:     Psychosocial Re-Evaluation    [place] Name 07/11/17 1716 08/03/17 1153           Psychosocial Re-Evaluation   Current issues with Current Sleep Concerns Current Sleep Concerns      Comments recent loss of mother recent loss of mother      Expected Outcomes patient will remain free from psychosocial barriers to participation in pulmonary rehab patient will remain free from psychosocial barriers to participation in pulmonary rehab      Interventions Encouraged to attend Pulmonary Rehabilitation for the exercise Encouraged to attend Pulmonary Rehabilitation for the exercise      Continue Psychosocial Services  Follow up required by staff Follow up required by staff         Psychosocial Discharge (Final Psychosocial Re-Evaluation):     Psychosocial Re-Evaluation - 08/03/17 1153      Psychosocial  Re-Evaluation   Current issues with Current Sleep Concerns   Comments recent loss of mother   Expected Outcomes patient will remain free from psychosocial barriers to participation in pulmonary rehab   Interventions Encouraged to attend Pulmonary Rehabilitation for the exercise   Continue Psychosocial Services  Follow up required by staff      Education: Education Goals: Education classes will be provided on a weekly basis, covering required topics. Participant will state understanding/return demonstration of topics presented.  Learning Barriers/Preferences:     Learning Barriers/Preferences - 06/18/17 1436      Learning Barriers/Preferences   Learning Barriers None   Learning Preferences Computer/Internet;Group Instruction;Individual Instruction;Verbal Instruction;Written Material      Education Topics: Risk Factor Reduction:  -Group instruction that is supported by a PowerPoint presentation. Instructor discusses the definition of a risk factor, different risk factors for pulmonary disease, and how the heart and lungs work together.     Nutrition for Pulmonary Patient:  -Group instruction provided by PowerPoint slides, verbal discussion, and written materials to support subject matter. The instructor gives an explanation and review of healthy diet recommendations, which includes a discussion on weight management, recommendations for fruit and vegetable consumption, as well as protein, fluid, caffeine, fiber, sodium, sugar, and alcohol. Tips for eating when patients are short of breath are discussed.   PULMONARY REHAB OTHER RESPIRATORY from 08/02/2017 in [place]  Date  07/05/17  Educator  Nutritionist  Instruction Review Code  2- meets goals/outcomes      Pursed Lip Breathing:  -Group instruction that is supported by demonstration and informational handouts. Instructor discusses the benefits of pursed lip and diaphragmatic breathing and detailed  demonstration on how to preform both.     Oxygen Safety:  -Group instruction provided by PowerPoint, verbal discussion,  and written material to support subject matter. There is an overview of "What is Oxygen" and "Why do we need it".  Instructor also reviews how to create a safe environment for oxygen use, the importance of using oxygen as prescribed, and the risks of noncompliance. There is a brief discussion on traveling with oxygen and resources the patient may utilize.   Oxygen Equipment:  -Group instruction provided by [facility] Staff utilizing handouts, written materials, and equipment demonstrations.   Signs and Symptoms:  -Group instruction provided by written material and verbal discussion to support subject matter. Warning signs and symptoms of infection, stroke, and heart attack are reviewed and when to call the physician/911 reinforced. Tips for preventing the spread of infection discussed.   PULMONARY REHAB OTHER RESPIRATORY from 08/02/2017 in [place]  Date  08/02/17  Educator  RN  Instruction Review Code  2- meets goals/outcomes      Advanced Directives:  -Group instruction provided by verbal instruction and written material to support subject matter. Instructor reviews Advanced Directive laws and proper instruction for filling out document.   Pulmonary Video:  -Group video education that reviews the importance of medication and oxygen compliance, exercise, good nutrition, pulmonary hygiene, and pursed lip and diaphragmatic breathing for the pulmonary patient.   Exercise for the Pulmonary Patient:  -Group instruction that is supported by a PowerPoint presentation. Instructor discusses benefits of exercise, core components of exercise, frequency, duration, and intensity of an exercise routine, importance of utilizing pulse oximetry during exercise, safety while exercising, and options of places to exercise outside of rehab.     PULMONARY  REHAB OTHER RESPIRATORY from 08/02/2017 in [place]  Date  07/24/17  Educator  ep  Instruction Review Code  2- meets goals/outcomes      Pulmonary Medications:  -Verbally interactive group education provided by instructor with focus on inhaled medications and proper administration.   PULMONARY REHAB OTHER RESPIRATORY from 08/02/2017 in [place]  Date  07/12/17  Educator  Pharmacist  Instruction Review Code  2- meets goals/outcomes      Anatomy and Physiology of the Respiratory System and Intimacy:  -Group instruction provided by PowerPoint, verbal discussion, and written material to support subject matter. Instructor reviews respiratory cycle and anatomical components of the respiratory system and their functions. Instructor also reviews differences in obstructive and restrictive respiratory diseases with examples of each. Intimacy, Sex, and Sexuality differences are reviewed with a discussion on how relationships can change when diagnosed with pulmonary disease. Common sexual concerns are reviewed.   MD DAY -A group question and answer session with a medical doctor that allows participants to ask questions that relate to their pulmonary disease state.   OTHER EDUCATION -Group or individual verbal, written, or video instructions that support the educational goals of the pulmonary rehab program.   Knowledge Questionnaire Score:     Knowledge Questionnaire Score - 06/21/17 1112      Knowledge Questionnaire Score   Pre Score 10/13      Core Components/Risk Factors/Patient Goals at Admission:     Personal Goals and Risk Factors at Admission - 06/18/17 1436      Core Components/Risk Factors/Patient Goals on Admission    Weight Management Obesity;Yes   Intervention Weight Management: Develop a combined nutrition and exercise program designed to reach desired caloric intake, while maintaining appropriate intake of  nutrient and fiber, sodium and fats, and appropriate energy expenditure required for  the weight goal.;Weight Management/Obesity: Establish reasonable short term and long term weight goals.;Weight Management: Provide education and appropriate resources to help participant work on and attain dietary goals.;Obesity: Provide education and appropriate resources to help participant work on and attain dietary goals.   Expected Outcomes Short Term: Continue to assess and modify interventions until short term weight is achieved;Long Term: Adherence to nutrition and physical activity/exercise program aimed toward attainment of established weight goal;Understanding of distribution of calorie intake throughout the day with the consumption of 4-5 meals/snacks;Understanding recommendations for meals to include 15-35% energy as protein, 25-35% energy from fat, 35-60% energy from carbohydrates, less than 238m of dietary cholesterol, 20-35 gm of total fiber daily;Weight Loss: Understanding of general recommendations for a balanced deficit meal plan, which promotes 1-2 lb weight loss per week and includes a negative energy balance of [phone] kcal/d   Improve shortness of breath with ADL's Yes   Intervention Provide education, individualized exercise plan and daily activity instruction to help decrease symptoms of SOB with activities of daily living.   Expected Outcomes Short Term: Achieves a reduction of symptoms when performing activities of daily living.   Develop more efficient breathing techniques such as purse lipped breathing and diaphragmatic breathing; and practicing self-pacing with activity Yes   Intervention Provide education, demonstration and support about specific breathing techniuqes utilized for more efficient breathing. Include techniques such as pursed lipped breathing, diaphragmatic breathing and self-pacing activity.   Expected Outcomes Short Term: Participant will be able to demonstrate and use breathing  techniques as needed throughout daily activities.   Increase knowledge of respiratory medications and ability to use respiratory devices properly  Yes   Intervention Provide education and demonstration as needed of appropriate use of medications, inhalers, and oxygen therapy.   Expected Outcomes Short Term: Achieves understanding of medications use. Understands that oxygen is a medication prescribed by physician. Demonstrates appropriate use of inhaler and oxygen therapy.      Core Components/Risk Factors/Patient Goals Review:      Goals and Risk Factor Review    Row Name 07/11/17 1715 08/03/17 1152           Core Components/Risk Factors/Patient Goals Review   Personal Goals Review Weight Management/Obesity;Develop more efficient breathing techniques such as purse lipped breathing and diaphragmatic breathing and practicing self-pacing with activity.;Improve shortness of breath with ADL's Weight Management/Obesity;Develop more efficient breathing techniques such as purse lipped breathing and diaphragmatic breathing and practicing self-pacing with activity.;Improve shortness of breath with ADL's      Review Patient has attended 3 sessions since admission. She works hard and is tolerating small workload increases. it is too soon to evaluate progress towards goals. Will re-eval in 30 days patient continues to work hard in pulmonary rehab. she has lost 1.1 kg at her last visit. she is watching her caloric intake and making smarter food choices. she is tolerating small workload increases and states she really enjoys coming to pulmonary rehab.      Expected Outcomes see admission expected outcomes see admission expected outcomes         Core Components/Risk Factors/Patient Goals at Discharge (Final Review):      Goals and Risk Factor Review - 08/03/17 1152      Core Components/Risk Factors/Patient Goals Review   Personal Goals Review Weight Management/Obesity;Develop more efficient breathing  techniques such as purse lipped breathing and diaphragmatic breathing and practicing self-pacing with activity.;Improve shortness of breath with ADL's   Review patient continues to work hard in pulmonary rehab.  she has lost 1.1 kg at her last visit. she is watching her caloric intake and making smarter food choices. she is tolerating small workload increases and states she really enjoys coming to pulmonary rehab.   Expected Outcomes see admission expected outcomes      ITP Comments:   Comments: ITP REVIEW Pt is making slow but expected progress toward pulmonary rehab goals after completing 8 sessions. Recommend continued exercise, life style modification, education, and utilization of breathing techniques to increase stamina and strength and decrease shortness of breath with exertion.

## 2017-08-07 ENCOUNTER — Encounter (HOSPITAL_COMMUNITY)
Admission: RE | Admit: 2017-08-07 | Discharge: 2017-08-07 | Disposition: A | Discharge status: Home / Self Care | Payer: BLUE CROSS/BLUE SHIELD | Source: Ambulatory Visit | Attending: Internal Medicine | Admitting: Internal Medicine

## 2017-08-07 VITALS — Wt 209.0 lb

## 2017-08-07 DIAGNOSIS — R0609 Other forms of dyspnea: Secondary | ICD-10-CM | POA: Diagnosis not present

## 2017-08-07 DIAGNOSIS — D860 Sarcoidosis of lung: Secondary | ICD-10-CM

## 2017-08-07 NOTE — Progress Notes (Signed)
Daily Session Note  Patient Details  Name: [name] MRN: [id number] Date of Birth: 1961/05/03 Referring Provider:     Pulmonary Rehab Walk Test from 06/26/2017 in [place]  Referring Provider  Dr. Melvyn Novas      Encounter Date: 08/07/2017  Check In:     Session Check In - 08/07/17 1030      Check-In   Location [facility]   Staff Present Rodney Langton, RN;Portia Rollene Rotunda, RN, BSN;Bruce Mayers, MS, ACSM RCEP, Exercise Physiologist   Supervising physician immediately available to respond to emergencies [facility] immediately available   Physician(s) Dr. Wynelle Cleveland   Medication changes reported     No   Fall or balance concerns reported    No   Tobacco Cessation No Change   Warm-up and Cool-down Performed as group-led instruction   Resistance Training Performed Yes   VAD Patient? No     Pain Assessment   Currently in Pain? No/denies   Multiple Pain Sites No      Capillary Blood Glucose: No results found for this or any previous visit (from the past 24 hour(s)).      Exercise Prescription Changes - 08/07/17 1200      Response to Exercise   Blood Pressure (Admit) 116/62   Blood Pressure (Exercise) 122/82   Blood Pressure (Exit) 114/75   Heart Rate (Admit) 77 bpm   Heart Rate (Exercise) 105 bpm   Heart Rate (Exit) 81 bpm   Oxygen Saturation (Admit) 95 %   Oxygen Saturation (Exercise) 90 %   Oxygen Saturation (Exit) 97 %   Rating of Perceived Exertion (Exercise) 13   Perceived Dyspnea (Exercise) 2   Duration Progress to 45 minutes of aerobic exercise without signs/symptoms of physical distress   Intensity THRR unchanged     Progression   Progression Continue to progress workloads to maintain intensity without signs/symptoms of physical distress.     Resistance Training   Training Prescription Yes   Weight b;ue bands   Reps 10-15   Time 10 Minutes     Oxygen   Oxygen Continuous   Liters 4     Bike   Level 0.5   Minutes 17     NuStep   Level 5   Minutes 17   METs 2.1     Track   Laps 9   Minutes 17      History  Smoking Status  . Former Smoker  . Packs/day: 1.00  . Years: 20.00  . Quit date: 11/14/2003  Smokeless Tobacco  . Never Used    Goals Met:  Exercise tolerated well No report of cardiac concerns or symptoms Strength training completed today  Goals Unmet:  Not Applicable  Comments: Service time is from 10:30a to 12:05p    Dr. Rush Farmer is Medical Director for Pulmonary Rehab at [facility].

## 2017-08-09 ENCOUNTER — Encounter (HOSPITAL_COMMUNITY)
Admission: RE | Admit: 2017-08-09 | Discharge: 2017-08-09 | Disposition: A | Discharge status: Home / Self Care | Payer: BLUE CROSS/BLUE SHIELD | Source: Ambulatory Visit | Attending: Internal Medicine | Admitting: Internal Medicine

## 2017-08-09 VITALS — Wt 209.9 lb

## 2017-08-09 DIAGNOSIS — D860 Sarcoidosis of lung: Secondary | ICD-10-CM

## 2017-08-09 DIAGNOSIS — R0609 Other forms of dyspnea: Secondary | ICD-10-CM | POA: Diagnosis not present

## 2017-08-09 NOTE — Progress Notes (Signed)
Daily Session Note  Patient Details  Name: [name] MRN: [id number] Date of Birth: 11/25/1960 Referring Provider:     Pulmonary Rehab Walk Test from 06/26/2017 in [facility]  Referring Provider  Dr. Wert      Encounter Date: 08/09/2017  Check In:     Session Check In - 08/09/17 1031      Check-In   [facility]   Staff Present Portia Payne, RN, BSN;Molly diVincenzo, MS, ACSM RCEP, Exercise Physiologist;Lisa Hughes, RN   Supervising physician immediately available to respond to emergencies [facility] immediately available   Physician(s) Dr. Vega   Medication changes reported     No   Fall or balance concerns reported    No   Tobacco Cessation No Change   Warm-up and Cool-down Performed as group-led instruction   Resistance Training Performed Yes   VAD Patient? No     Pain Assessment   Currently in Pain? No/denies   Multiple Pain Sites No      Capillary Blood Glucose: No results found for this or any previous visit (from the past 24 hour(s)).      Exercise Prescription Changes - 08/09/17 1200      Response to Exercise   Blood Pressure (Admit) 116/64   Blood Pressure (Exercise) 108/70   Blood Pressure (Exit) 104/68   Heart Rate (Admit) 77 bpm   Heart Rate (Exercise) 94 bpm   Heart Rate (Exit) 73 bpm   Oxygen Saturation (Admit) 97 %   Oxygen Saturation (Exercise) 93 %   Oxygen Saturation (Exit) 98 %   Rating of Perceived Exertion (Exercise) 9   Perceived Dyspnea (Exercise) 1   Duration Progress to 45 minutes of aerobic exercise without signs/symptoms of physical distress   Intensity THRR unchanged     Progression   Progression Continue to progress workloads to maintain intensity without signs/symptoms of physical distress.     Resistance Training   Training Prescription Yes   Weight b;ue bands   Reps 10-15   Time 10 Minutes     Oxygen   Oxygen Continuous   Liters 4     NuStep   Level  5   Minutes 17   METs 2     Track   Laps 10   Minutes 17      History  Smoking Status  . Former Smoker  . Packs/day: 1.00  . Years: 20.00  . Quit date: 11/14/2003  Smokeless Tobacco  . Never Used    Goals Met:  Exercise tolerated well No report of cardiac concerns or symptoms Strength training completed today  Goals Unmet:  Not Applicable  Comments: Service time is from 10:30A to 12:10P    Dr. Wesam G. Yacoub is Medical Director for Pulmonary Rehab at [facility]. 

## 2017-08-13 ENCOUNTER — Ambulatory Visit
Admission: RE | Admit: 2017-08-13 | Discharge: 2017-08-13 | Disposition: A | Discharge status: Home / Self Care | Payer: BLUE CROSS/BLUE SHIELD | Source: Ambulatory Visit | Attending: Hematology and Oncology | Admitting: Hematology and Oncology

## 2017-08-13 DIAGNOSIS — R928 Other abnormal and inconclusive findings on diagnostic imaging of breast: Secondary | ICD-10-CM | POA: Diagnosis not present

## 2017-08-13 DIAGNOSIS — Z853 Personal history of malignant neoplasm of breast: Secondary | ICD-10-CM

## 2017-08-13 HISTORY — DX: Personal history of irradiation: Z92.3

## 2017-08-13 HISTORY — DX: Malignant neoplasm of unspecified site of unspecified female breast: C50.919

## 2017-08-14 ENCOUNTER — Encounter (HOSPITAL_COMMUNITY)
Admission: RE | Admit: 2017-08-14 | Discharge: 2017-08-14 | Disposition: A | Discharge status: Home / Self Care | Payer: BLUE CROSS/BLUE SHIELD | Source: Ambulatory Visit | Attending: Internal Medicine | Admitting: Internal Medicine

## 2017-08-14 VITALS — Wt 209.0 lb

## 2017-08-14 DIAGNOSIS — R0609 Other forms of dyspnea: Secondary | ICD-10-CM | POA: Diagnosis not present

## 2017-08-14 DIAGNOSIS — D860 Sarcoidosis of lung: Secondary | ICD-10-CM

## 2017-08-14 NOTE — Progress Notes (Signed)
While exercising at Pulmonary Rehab, [name] consistently uses 4 liters of continuous flow oxygen. This is provided by Pulmonary Rehab [facility]. At home, the patient currently uses POC for their home oxygen system. While exercising at home, the patient was instructed to use 4 liters. In Pulmonary Rehab today, [name] walked the track utilizing their own home oxygen system. They used 4 liters pulsed which maintained their oxygen saturation above 89%.

## 2017-08-14 NOTE — Progress Notes (Signed)
I have reviewed a Home Exercise Prescription with [name] . [name] is not currently exercising at home.  The patient was advised to walk 2-3 days a week for 30 minutes.  Blanch Media and I discussed how to progress their exercise prescription.  The patient stated that their goals were to be able to walk longer distances, decrease shortness of breath, and be able to go back to work.  The patient stated that they understand the exercise prescription.  We reviewed exercise guidelines, target heart rate during exercise, oxygen use, weather, home pulse oximeter, endpoints for exercise, and goals.  Patient is encouraged to come to me with any questions. I will continue to follow up with the patient to assist them with progression and safety.

## 2017-08-14 NOTE — Progress Notes (Signed)
Daily Session Note  Patient Details  Name: [name] MRN: [id number] Date of Birth: 20-Jun-1961 Referring Provider:     Pulmonary Rehab Walk Test from 06/26/2017 in [place]  Referring Provider  Dr. Melvyn Novas      Encounter Date: 08/14/2017  Check In:     Session Check In - 08/14/17 1228      Check-In   [facility]   Staff Present Su Hilt, MS, ACSM RCEP, Exercise Physiologist;Lisa Ysidro Evert, RN;Other   Supervising physician immediately available to respond to emergencies [facility] immediately available   Physician(s) Dr. Wendee Beavers   Medication changes reported     No   Fall or balance concerns reported    No   Tobacco Cessation No Change   Warm-up and Cool-down Performed as group-led instruction   Resistance Training Performed Yes   VAD Patient? No     Pain Assessment   Currently in Pain? No/denies   Multiple Pain Sites No      Capillary Blood Glucose: No results found for this or any previous visit (from the past 24 hour(s)).      Exercise Prescription Changes - 08/14/17 1200      Response to Exercise   Blood Pressure (Admit) 118/62   Blood Pressure (Exercise) 138/80   Blood Pressure (Exit) 100/60   Heart Rate (Admit) 84 bpm   Heart Rate (Exercise) 102 bpm   Heart Rate (Exit) 89 bpm   Oxygen Saturation (Admit) 94 %   Oxygen Saturation (Exercise) 89 %   Oxygen Saturation (Exit) 98 %   Rating of Perceived Exertion (Exercise) 14   Perceived Dyspnea (Exercise) 2   Duration Progress to 45 minutes of aerobic exercise without signs/symptoms of physical distress   Intensity THRR unchanged     Progression   Progression Continue to progress workloads to maintain intensity without signs/symptoms of physical distress.     Resistance Training   Training Prescription Yes   Weight b;ue bands   Reps 10-15   Time 10 Minutes     Oxygen   Oxygen Continuous   Liters 4     Bike   Level 0.5   Minutes  17     NuStep   Level 5   Minutes 17   METs 2     Track   Laps 7   Minutes 17      History  Smoking Status  . Former Smoker  . Packs/day: 1.00  . Years: 20.00  . Quit date: 11/14/2003  Smokeless Tobacco  . Never Used    Goals Met:  Exercise tolerated well No report of cardiac concerns or symptoms Strength training completed today  Goals Unmet:  Not Applicable  Comments: Service time is from 10:30a to 12:10p    Dr. Rush Farmer is Medical Director for Pulmonary Rehab at [facility].

## 2017-08-16 ENCOUNTER — Telehealth (HOSPITAL_COMMUNITY): Payer: Self-pay | Admitting: Internal Medicine

## 2017-08-16 ENCOUNTER — Encounter (HOSPITAL_COMMUNITY): Payer: BLUE CROSS/BLUE SHIELD

## 2017-08-21 ENCOUNTER — Encounter (HOSPITAL_COMMUNITY)
Admission: RE | Admit: 2017-08-21 | Discharge: 2017-08-21 | Disposition: A | Discharge status: Home / Self Care | Payer: BLUE CROSS/BLUE SHIELD | Source: Ambulatory Visit | Attending: Internal Medicine | Admitting: Internal Medicine

## 2017-08-21 VITALS — Wt 206.6 lb

## 2017-08-21 DIAGNOSIS — D860 Sarcoidosis of lung: Secondary | ICD-10-CM

## 2017-08-21 DIAGNOSIS — R0609 Other forms of dyspnea: Secondary | ICD-10-CM | POA: Diagnosis not present

## 2017-08-21 NOTE — Progress Notes (Signed)
Daily Session Note  Patient Details  Name: [name] MRN: [id number] Date of Birth: 01-Jul-1961 Referring Provider:     Pulmonary Rehab Walk Test from 06/26/2017 in [place]  Referring Provider  Dr. Melvyn Novas      Encounter Date: 08/21/2017  Check In:     Session Check In - 08/21/17 1106      Check-In   Location [facility]   Staff Present Su Hilt, MS, ACSM RCEP, Exercise Physiologist;Lisa Ysidro Evert, RN;Portia Rollene Rotunda, RN, BSN   Supervising physician immediately available to respond to emergencies [facility] immediately available   Physician(s) Dr. Zigmund Daniel   Medication changes reported     No   Fall or balance concerns reported    No   Tobacco Cessation No Change   Warm-up and Cool-down Performed as group-led instruction   Resistance Training Performed Yes   VAD Patient? No     Pain Assessment   Currently in Pain? No/denies   Multiple Pain Sites No      Capillary Blood Glucose: No results found for this or any previous visit (from the past 24 hour(s)).      Exercise Prescription Changes - 08/21/17 1300      Response to Exercise   Blood Pressure (Admit) 142/84   Blood Pressure (Exercise) 142/84   Blood Pressure (Exit) 108/72   Heart Rate (Admit) 75 bpm   Heart Rate (Exercise) 103 bpm   Heart Rate (Exit) 81 bpm   Oxygen Saturation (Admit) 93 %   Oxygen Saturation (Exercise) 92 %   Oxygen Saturation (Exit) 99 %   Rating of Perceived Exertion (Exercise) 13   Perceived Dyspnea (Exercise) 2   Duration Progress to 45 minutes of aerobic exercise without signs/symptoms of physical distress   Intensity THRR unchanged     Progression   Progression Continue to progress workloads to maintain intensity without signs/symptoms of physical distress.     Resistance Training   Training Prescription Yes   Weight b;ue bands   Reps 10-15   Time 10 Minutes     Oxygen   Oxygen Continuous   Liters 4     NuStep   Level 5   Minutes 17   METs 1.9     Track   Laps 11   Minutes 17      History  Smoking Status  . Former Smoker  . Packs/day: 1.00  . Years: 20.00  . Quit date: 11/14/2003  Smokeless Tobacco  . Never Used    Goals Met:  Exercise tolerated well No report of cardiac concerns or symptoms Strength training completed today  Goals Unmet:  Not Applicable  Comments: Service time is from 10:30a to 12:30p    Dr. Rush Farmer is Medical Director for Pulmonary Rehab at [facility].

## 2017-08-23 ENCOUNTER — Encounter (HOSPITAL_COMMUNITY)
Admission: RE | Admit: 2017-08-23 | Discharge: 2017-08-23 | Disposition: A | Discharge status: Home / Self Care | Payer: BLUE CROSS/BLUE SHIELD | Source: Ambulatory Visit | Attending: Internal Medicine | Admitting: Internal Medicine

## 2017-08-23 ENCOUNTER — Encounter (HOSPITAL_COMMUNITY): Admission: RE | Admit: 2017-08-23 | Payer: BLUE CROSS/BLUE SHIELD | Source: Ambulatory Visit

## 2017-08-23 VITALS — Wt 207.2 lb

## 2017-08-23 DIAGNOSIS — D860 Sarcoidosis of lung: Secondary | ICD-10-CM

## 2017-08-23 DIAGNOSIS — R0609 Other forms of dyspnea: Secondary | ICD-10-CM | POA: Diagnosis not present

## 2017-08-23 NOTE — Progress Notes (Signed)
Daily Session Note  Patient Details  Name: [name] MRN: [id number] Date of Birth: [birth year] Referring Provider:     Pulmonary Rehab Walk Test from 06/26/2017 in [place]  Referring Provider  Dr. Melvyn Novas      Encounter Date: 08/23/2017  Check In:     Session Check In - 08/23/17 1210      Check-In   Location [facility]   Staff Present Su Hilt, MS, ACSM RCEP, Exercise Physiologist;Portia Rollene Rotunda, RN, Roque Cash, RN   Supervising physician immediately available to respond to emergencies [facility] immediately available   Physician(s) Dr. Verlon Au   Medication changes reported     No   Fall or balance concerns reported    No   Tobacco Cessation No Change   Warm-up and Cool-down Performed as group-led instruction   Resistance Training Performed Yes   VAD Patient? No     Pain Assessment   Currently in Pain? No/denies   Multiple Pain Sites No      Capillary Blood Glucose: No results found for this or any previous visit (from the past 24 hour(s)).      Exercise Prescription Changes - 08/23/17 1200      Response to Exercise   Blood Pressure (Admit) 118/80   Blood Pressure (Exercise) 102/60   Blood Pressure (Exit) 125/77   Heart Rate (Admit) 80 bpm   Heart Rate (Exercise) 104 bpm   Heart Rate (Exit) 88 bpm   Oxygen Saturation (Admit) 96 %   Oxygen Saturation (Exercise) 87 %   Oxygen Saturation (Exit) 96 %   Rating of Perceived Exertion (Exercise) 13   Perceived Dyspnea (Exercise) 2   Duration Progress to 45 minutes of aerobic exercise without signs/symptoms of physical distress   Intensity THRR unchanged     Progression   Progression Continue to progress workloads to maintain intensity without signs/symptoms of physical distress.     Resistance Training   Training Prescription Yes   Weight b;ue bands   Reps 10-15   Time 10 Minutes     Oxygen   Oxygen Continuous   Liters 4     Bike   Level 0.5   Minutes 17     NuStep   Level 5   Minutes 17   METs 2.1     Track   Laps 11   Minutes 17      History  Smoking Status  . Former Smoker  . Packs/day: 1.00  . Years: 20.00  . Quit date: 11/14/2003  Smokeless Tobacco  . Never Used    Goals Met:  Exercise tolerated well No report of cardiac concerns or symptoms Strength training completed today  Goals Unmet:  Not Applicable  Comments: Service time is from 10:30a to 12:10p    Dr. Rush Farmer is Medical Director for Pulmonary Rehab at [facility].

## 2017-08-24 DIAGNOSIS — J9611 Chronic respiratory failure with hypoxia: Secondary | ICD-10-CM | POA: Diagnosis not present

## 2017-08-24 DIAGNOSIS — J449 Chronic obstructive pulmonary disease, unspecified: Secondary | ICD-10-CM | POA: Diagnosis not present

## 2017-08-27 DIAGNOSIS — R232 Flushing: Secondary | ICD-10-CM | POA: Diagnosis not present

## 2017-08-27 DIAGNOSIS — C50411 Malignant neoplasm of upper-outer quadrant of right female breast: Secondary | ICD-10-CM | POA: Diagnosis not present

## 2017-08-27 DIAGNOSIS — T50905A Adverse effect of unspecified drugs, medicaments and biological substances, initial encounter: Secondary | ICD-10-CM | POA: Diagnosis not present

## 2017-08-28 ENCOUNTER — Telehealth: Payer: Self-pay | Admitting: Internal Medicine

## 2017-08-28 ENCOUNTER — Encounter (HOSPITAL_COMMUNITY)
Admission: RE | Admit: 2017-08-28 | Discharge: 2017-08-28 | Disposition: A | Discharge status: Home / Self Care | Payer: BLUE CROSS/BLUE SHIELD | Source: Ambulatory Visit | Attending: Internal Medicine | Admitting: Internal Medicine

## 2017-08-28 VITALS — Wt 207.5 lb

## 2017-08-28 DIAGNOSIS — R0609 Other forms of dyspnea: Secondary | ICD-10-CM | POA: Diagnosis not present

## 2017-08-28 DIAGNOSIS — D860 Sarcoidosis of lung: Secondary | ICD-10-CM

## 2017-08-28 NOTE — Telephone Encounter (Signed)
Placed forms in MW's lookat to be completed, thanks

## 2017-08-28 NOTE — Progress Notes (Signed)
Daily Session Note  Patient Details  Name: [name] MRN: [id number] Date of Birth: [birth year] Referring Provider:     Pulmonary Rehab Walk Test from 06/26/2017 in [place]  Referring Provider  Dr. Melvyn Novas      Encounter Date: 08/28/2017  Check In:     Session Check In - 08/28/17 1215      Check-In   Location [facility]   Staff Present Su Hilt, MS, ACSM RCEP, Exercise Physiologist;Lisa Ysidro Evert, RN;Portia Rollene Rotunda, RN, BSN   Supervising physician immediately available to respond to emergencies [facility] immediately available   Physician(s) Dr. Verlon Au   Medication changes reported     No   Fall or balance concerns reported    No   Tobacco Cessation No Change   Warm-up and Cool-down Performed as group-led instruction   Resistance Training Performed Yes   VAD Patient? No     Pain Assessment   Currently in Pain? No/denies   Multiple Pain Sites No      Capillary Blood Glucose: No results found for this or any previous visit (from the past 24 hour(s)).      Exercise Prescription Changes - 08/28/17 1200      Response to Exercise   Blood Pressure (Admit) 122/74   Blood Pressure (Exercise) 112/70   Blood Pressure (Exit) 106/64   Heart Rate (Admit) 80 bpm   Heart Rate (Exercise) 111 bpm   Heart Rate (Exit) 90 bpm   Oxygen Saturation (Admit) 95 %   Oxygen Saturation (Exercise) 92 %   Oxygen Saturation (Exit) 98 %   Rating of Perceived Exertion (Exercise) 14   Perceived Dyspnea (Exercise) 3   Duration Progress to 45 minutes of aerobic exercise without signs/symptoms of physical distress   Intensity THRR unchanged     Progression   Progression Continue to progress workloads to maintain intensity without signs/symptoms of physical distress.     Resistance Training   Training Prescription Yes   Weight b;ue bands   Reps 10-15   Time 10 Minutes     Oxygen   Oxygen Continuous   Liters 4     Bike   Level 0.8   Minutes 17     NuStep   Level 5   Minutes 17   METs 1.9     Track   Laps 7   Minutes 17      History  Smoking Status  . Former Smoker  . Packs/day: 1.00  . Years: 20.00  . Quit date: 11/14/2003  Smokeless Tobacco  . Never Used    Goals Met:  Exercise tolerated well No report of cardiac concerns or symptoms Strength training completed today  Goals Unmet:  Not Applicable  Comments: Service time is from 10:30a to 12:10p    Dr. Rush Farmer is Medical Director for Pulmonary Rehab at [facility].

## 2017-08-30 ENCOUNTER — Encounter (HOSPITAL_COMMUNITY)
Admission: RE | Admit: 2017-08-30 | Discharge: 2017-08-30 | Disposition: A | Discharge status: Home / Self Care | Payer: BLUE CROSS/BLUE SHIELD | Source: Ambulatory Visit | Attending: Internal Medicine | Admitting: Internal Medicine

## 2017-08-30 VITALS — Wt 207.5 lb

## 2017-08-30 DIAGNOSIS — D860 Sarcoidosis of lung: Secondary | ICD-10-CM

## 2017-08-30 DIAGNOSIS — R0609 Other forms of dyspnea: Secondary | ICD-10-CM | POA: Diagnosis not present

## 2017-08-30 NOTE — Telephone Encounter (Signed)
ATC patient. Call went straight to VM. Left message for patient to call back.

## 2017-08-30 NOTE — Telephone Encounter (Signed)
Cannot provide any support for her disability based on last exam > needs ov specifically to address her limitations now that she's been doing rehab

## 2017-08-30 NOTE — Telephone Encounter (Signed)
Called and spoke with pt and she is aware of appt with MW on Monday at 845.

## 2017-08-30 NOTE — Progress Notes (Signed)
Daily Session Note  Patient Details  Name: [name] MRN: [id number] Date of Birth: September 22, 1961 Referring Provider:     Pulmonary Rehab Walk Test from 06/26/2017 in [place]  Referring Provider  Dr. Melvyn Novas      Encounter Date: 08/30/2017  Check In:     Session Check In - 08/30/17 1030      Check-In   Location [facility]   Staff Present Su Hilt, MS, ACSM RCEP, Exercise Physiologist;Minami Arriaga Ysidro Evert, RN;Portia Rollene Rotunda, RN, BSN   Supervising physician immediately available to respond to emergencies [facility] immediately available   Physician(s) Dr. Horris Latino   Medication changes reported     No   Fall or balance concerns reported    No   Tobacco Cessation No Change   Warm-up and Cool-down Performed as group-led instruction   Resistance Training Performed Yes   VAD Patient? No     Pain Assessment   Currently in Pain? No/denies   Multiple Pain Sites No      Capillary Blood Glucose: No results found for this or any previous visit (from the past 24 hour(s)).      Exercise Prescription Changes - 08/30/17 1200      Response to Exercise   Blood Pressure (Admit) 116/66   Blood Pressure (Exercise) 120/70   Blood Pressure (Exit) 104/70   Heart Rate (Admit) 83 bpm   Heart Rate (Exercise) 99 bpm   Heart Rate (Exit) 88 bpm   Oxygen Saturation (Admit) 97 %   Oxygen Saturation (Exercise) 93 %   Oxygen Saturation (Exit) 99 %   Rating of Perceived Exertion (Exercise) 11   Perceived Dyspnea (Exercise) 2   Duration Progress to 45 minutes of aerobic exercise without signs/symptoms of physical distress   Intensity THRR unchanged     Progression   Progression Continue to progress workloads to maintain intensity without signs/symptoms of physical distress.     Resistance Training   Training Prescription Yes   Weight blue bands   Reps 10-15   Time 10 Minutes     Oxygen   Oxygen Continuous   Liters 4     Bike   Level 0.8   Minutes 17     NuStep   Level 5   Minutes 17   METs 1.8      History  Smoking Status  . Former Smoker  . Packs/day: 1.00  . Years: 20.00  . Quit date: 11/14/2003  Smokeless Tobacco  . Never Used    Goals Met:  Personal goals reviewed No report of cardiac concerns or symptoms Strength training completed today  Goals Unmet:  Not Applicable  Comments: Service time is from 1030 to 1235     Dr. Rush Farmer is Medical Director for Pulmonary Rehab at [facility].

## 2017-08-30 NOTE — Telephone Encounter (Signed)
Patient returned call - she can be reached at [phone] -pr

## 2017-09-03 ENCOUNTER — Ambulatory Visit (INDEPENDENT_AMBULATORY_CARE_PROVIDER_SITE_OTHER): Payer: BLUE CROSS/BLUE SHIELD | Admitting: Internal Medicine

## 2017-09-03 ENCOUNTER — Encounter: Payer: Self-pay | Admitting: Internal Medicine

## 2017-09-03 VITALS — BP 138/68 | HR 83 | Ht 65.0 in | Wt 210.0 lb

## 2017-09-03 DIAGNOSIS — J9611 Chronic respiratory failure with hypoxia: Secondary | ICD-10-CM

## 2017-09-03 DIAGNOSIS — J449 Chronic obstructive pulmonary disease, unspecified: Secondary | ICD-10-CM

## 2017-09-03 DIAGNOSIS — E669 Obesity, unspecified: Secondary | ICD-10-CM | POA: Diagnosis not present

## 2017-09-03 DIAGNOSIS — J9612 Chronic respiratory failure with hypercapnia: Secondary | ICD-10-CM

## 2017-09-03 MED ORDER — TIOTROPIUM BROMIDE-OLODATEROL 2.5-2.5 MCG/ACT IN AERS: 2.0000 | INHALATION_SPRAY | Freq: Every day | RESPIRATORY_TRACT | 11 refills | Status: DC

## 2017-09-03 MED ORDER — TIOTROPIUM BROMIDE-OLODATEROL 2.5-2.5 MCG/ACT IN AERS: 2.0000 | INHALATION_SPRAY | Freq: Every day | RESPIRATORY_TRACT | 0 refills | Status: DC

## 2017-09-03 NOTE — Patient Instructions (Addendum)
Plan A = Automatic =    Stiolto 2 puffs each am   Work on inhaler technique:  relax and gently blow all the way out then take a nice smooth deep breath back in, triggering the inhaler at same time you start breathing in.  Hold for up to 5 seconds if you can.  . Rinse and gargle with water when done  Let me know if too expensive on your insurance    Plan B = Backup Only use your albuterol as a rescue medication to be used if you can't catch your breath by resting or doing a relaxed purse lip breathing pattern.  - The less you use it, the better it will work when you need it. - Ok to use the inhaler up to 2 puffs  every 4 hours if you must but call for appointment if use goes up over your usual need - Don't leave home without it !!  (think of it like the spare tire for your car)    Try to build up to 30 min of ex on 4lpm on bike at bike adjust the speed and resistance    Keep appt with Tammy for 10/17/17

## 2017-09-03 NOTE — Progress Notes (Signed)
Pulmonary Individual Treatment Plan  Patient Details  Name: [name] MRN: [id number] Date of Birth: 1960/12/05 Referring Provider:     Pulmonary Rehab Walk Test from 06/26/2017 in [facility]  Referring Provider  Dr. Melvyn Novas      Initial Encounter Date:    Pulmonary Rehab Walk Test from 06/26/2017 in [facility]  Date  06/26/17  Referring Provider  Dr. Melvyn Novas      Visit Diagnosis: Sarcoidosis of lung ([place])  Patient's Home Medications on Admission:   Current Outpatient Prescriptions:  .  acetaminophen (TYLENOL) 500 MG tablet, Take 1,000 mg by mouth every 6 (six) hours as needed for fever. Reported on 05/10/2016, Disp: , Rfl:  .  albuterol (VENTOLIN HFA) 108 (90 Base) MCG/ACT inhaler, Inhale 2 puffs into the lungs every 6 (six) hours as needed for wheezing or shortness of breath., Disp: 1 Inhaler, Rfl: 2 .  anastrozole (ARIMIDEX) 1 MG tablet, Take 1 tablet (1 mg total) by mouth at bedtime., Disp: 90 tablet, Rfl: 3 .  atorvastatin (LIPITOR) 20 MG tablet, Take 1 tablet (20 mg total) by mouth daily., Disp: 90 tablet, Rfl: 3 .  budesonide-formoterol (SYMBICORT) 160-4.5 MCG/ACT inhaler, Inhale 2 puffs into the lungs 2 (two) times daily., Disp: 1 Inhaler, Rfl: 2 .  diphenhydrAMINE (BENADRYL) 25 MG tablet, Take 1 tablet (25 mg total) by mouth every 6 (six) hours as needed., Disp: 30 tablet, Rfl: 0 .  fluticasone (FLONASE) 50 MCG/ACT nasal spray, Place 2 sprays into both nostrils daily as needed for allergies., Disp: 16 g, Rfl: 11 .  gabapentin (NEURONTIN) 300 MG capsule, Take 1 capsule (300 mg total) by mouth at bedtime., Disp: 90 capsule, Rfl: 3 .  hydrochlorothiazide (HYDRODIURIL) 12.5 MG tablet, , Disp: , Rfl: 0 .  ibuprofen (ADVIL,MOTRIN) 600 MG tablet, Take 1 tablet (600 mg total) by mouth every 8 (eight) hours as needed., Disp: 30 tablet, Rfl: 0 .  metoprolol tartrate (LOPRESSOR) 25 MG tablet, Take 1 tablet (25 mg total) by mouth  2 (two) times daily., Disp: 60 tablet, Rfl: 11 .  Multiple Vitamin (MULTIVITAMIN) capsule, Take 1 capsule by mouth daily., Disp: , Rfl:  .  olopatadine (PATANOL) 0.1 % ophthalmic solution, , Disp: , Rfl: 1 .  omeprazole (PRILOSEC) 20 MG capsule, TAKE ONE CAPSULE BY MOUTH  DAILY, Disp: 30 capsule, Rfl: 5 .  OXYGEN, 2lpm with rest and 3-4 lpm with exertion, Disp: , Rfl:  .  oxymetazoline (AFRIN NASAL SPRAY) 0.05 % nasal spray, Place 1 spray into both nostrils 2 (two) times daily., Disp: 30 mL, Rfl: 0 .  predniSONE (DELTASONE) 5 MG tablet, Take 5 mg by mouth every other day. , Disp: , Rfl:  .  Tiotropium Bromide-Olodaterol (STIOLTO RESPIMAT) 2.5-2.5 MCG/ACT AERS, Inhale 2 puffs into the lungs daily., Disp: 1 Inhaler, Rfl: 11 .  triamterene-hydrochlorothiazide (MAXZIDE-25) 37.5-25 MG tablet, TAKE ONE TABLET BY MOUTH ONCE DAILY, Disp: 30 tablet, Rfl: 11  Past Medical History: Past Medical History:  Diagnosis Date  . Allergic rhinitis   . Breast cancer ([employer]) 2016   right breast  . COPD, mild ([place]) FOLLOWED BY DR Melvyn Novas  . GERD (gastroesophageal reflux disease)   . HTN (hypertension)   . Hypertension   . Iron deficiency anemia   . Long-term current use of steroids SYMBICORT INHALER  . 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 Personal history of radiation therapy 2016  . Sarcoidosis STABLE PER CXR JUNE 2013  . Shortness of  breath   . Sickle cell trait (H[facility])     Tobacco Use: History  Smoking Status  . Former Smoker  . Packs/day: 1.00  . Years: 20.00  . Quit date: 11/14/2003  Smokeless Tobacco  . Never Used    Labs: Recent Review Flowsheet Data    Labs for ITP Cardiac and Pulmonary Rehab Latest Ref Rng & Units 12/20/2012 09/10/2013 02/04/2015 08/13/2015 02/05/2017   Cholestrol 100 - 199 mg/dL 187 - 178 - 247(H)   LDLCALC 0 - 99 mg/dL 106(H) - 118(H) - 131(H)   HDL >39 mg/dL 53 - 43(L) - 80   Trlycerides 0 - 149 mg/dL 139 - 84 - 178(H)   Hemoglobin A1c - - 5.9 5.9 - -   TCO2 0 - 100 mmol/L - - - 30 -       Capillary Blood Glucose: Lab Results  Component Value Date   GLUCAP 83 07/19/2012     Pulmonary Assessment Scores:   Pulmonary Function Assessment:     Pulmonary Function Assessment - 06/18/17 1436      Breath   Bilateral Breath Sounds Clear;Decreased   Shortness of Breath Yes;Limiting activity      Exercise Target Goals:    Exercise Program Goal: Individual exercise prescription set with THRR, safety & activity barriers. Participant demonstrates ability to understand and report RPE using BORG scale, to self-measure pulse accurately, and to acknowledge the importance of the exercise prescription.  Exercise Prescription Goal: Starting with aerobic activity 30 plus minutes a day, 3 days per week for initial exercise prescription. Provide home exercise prescription and guidelines that participant acknowledges understanding prior to discharge.  Activity Barriers & Risk Stratification:   6 Minute Walk:     6 Minute Walk    Row Name 06/26/17 1634         6 Minute Walk   Phase Initial     Distance 1037 feet     Walk Time 5 minutes     # of Rest Breaks 1  1 minute     MPH 1.96     METS 2.53     RPE 11     Perceived Dyspnea  2     Symptoms No     Resting HR 78 bpm     Resting BP 120/79     Max Ex. HR 118 bpm     Max Ex. BP 175/78     2 Minute Post BP 122/85       Interval HR   Baseline HR (retired) 78     1 Minute HR 97     2 Minute HR 106     3 Minute HR 86     4 Minute HR 101     5 Minute HR 118     6 Minute HR 118     2 Minute Post HR 88     Interval Heart Rate? Yes       Interval Oxygen   Interval Oxygen? Yes     Baseline Oxygen Saturation % 94 %     Resting Liters of Oxygen 2 L     1 Minute Oxygen Saturation % 91 %     1 Minute Liters of Oxygen 2 L     2 Minute Oxygen Saturation % 87 %     2 Minute Liters of Oxygen 2 L     3 Minute Oxygen Saturation % 93 %     3 Minute Liters of Oxygen 3 L  4 Minute Oxygen Saturation % 92 %     4  Minute Liters of Oxygen 3 L     5 Minute Oxygen Saturation % 89 %     5 Minute Liters of Oxygen 3 L     6 Minute Oxygen Saturation % 89 %     6 Minute Liters of Oxygen 3 L     2 Minute Post Oxygen Saturation % 96 %     2 Minute Post Liters of Oxygen 3 L        Oxygen Initial Assessment:     Oxygen Initial Assessment - 06/26/17 1633      Initial 6 min Walk   Oxygen Used Continuous;E-Tanks   Liters per minute 3   Resting Oxygen Saturation  93 %   Exercise Oxygen Saturation  during 6 min walk 87 %  87 on 2 liters, titrated to 3 liters     Program Oxygen Prescription   Program Oxygen Prescription Continuous;E-Tanks   Liters per minute 3      Oxygen Re-Evaluation:     Oxygen Re-Evaluation    Row Name 07/11/17 1713 08/03/17 1150 08/31/17 1200         Program Oxygen Prescription   Program Oxygen Prescription Continuous;E-Tanks Continuous;E-Tanks Continuous;E-Tanks     Liters per minute 3 3 3        Home Oxygen   Home Oxygen Device Portable Concentrator;Home Concentrator Portable Concentrator;Home Concentrator Portable Concentrator;Home Concentrator     Sleep Oxygen Prescription Continuous Continuous Continuous     Liters per minute 2 2 2      Home Exercise Oxygen Prescription Continuous Continuous Continuous     Liters per minute 2 3 3      Home at Rest Exercise Oxygen Prescription Continuous Continuous Continuous     Liters per minute 2 2 2      Compliance with Home Oxygen Use Yes Yes Yes       Goals/Expected Outcomes   Short Term Goals To learn and exhibit compliance with exercise, home and travel O2 prescription;To learn and understand importance of monitoring SPO2 with pulse oximeter and demonstrate accurate use of the pulse oximeter.;To learn and understand importance of maintaining oxygen saturations>88%;To learn and demonstrate proper pursed lip breathing techniques or other breathing techniques.;To learn and demonstrate proper use of respiratory medications To learn  and exhibit compliance with exercise, home and travel O2 prescription;To learn and understand importance of monitoring SPO2 with pulse oximeter and demonstrate accurate use of the pulse oximeter.;To learn and understand importance of maintaining oxygen saturations>88%;To learn and demonstrate proper pursed lip breathing techniques or other breathing techniques.;To learn and demonstrate proper use of respiratory medications To learn and exhibit compliance with exercise, home and travel O2 prescription;To learn and understand importance of monitoring SPO2 with pulse oximeter and demonstrate accurate use of the pulse oximeter.;To learn and understand importance of maintaining oxygen saturations>88%;To learn and demonstrate proper pursed lip breathing techniques or other breathing techniques.;To learn and demonstrate proper use of respiratory medications     Long  Term Goals Exhibits compliance with exercise, home and travel O2 prescription;Verbalizes importance of monitoring SPO2 with pulse oximeter and return demonstration;Maintenance of O2 saturations>88%;Exhibits proper breathing techniques, such as pursed lip breathing or other method taught during program session;Compliance with respiratory medication;Demonstrates proper use of MDI's Exhibits compliance with exercise, home and travel O2 prescription;Verbalizes importance of monitoring SPO2 with pulse oximeter and return demonstration;Maintenance of O2 saturations>88%;Exhibits proper breathing techniques, such as pursed lip breathing or other method taught  during program session;Compliance with respiratory medication;Demonstrates proper use of MDI's Exhibits compliance with exercise, home and travel O2 prescription;Verbalizes importance of monitoring SPO2 with pulse oximeter and return demonstration;Maintenance of O2 saturations>88%;Exhibits proper breathing techniques, such as pursed lip breathing or other method taught during program session;Compliance with  respiratory medication;Demonstrates proper use of MDI's     Goals/Expected Outcomes  - patient compliant with home oxygen requirements and meets goals as stated above patient compliant with home oxygen requirements and meets goals as stated above        Oxygen Discharge (Final Oxygen Re-Evaluation):     Oxygen Re-Evaluation - 08/31/17 1200      Program Oxygen Prescription   Program Oxygen Prescription Continuous;E-Tanks   Liters per minute 3     Home Oxygen   Home Oxygen Device Portable Concentrator;Home Concentrator   Sleep Oxygen Prescription Continuous   Liters per minute 2   Home Exercise Oxygen Prescription Continuous   Liters per minute 3   Home at Rest Exercise Oxygen Prescription Continuous   Liters per minute 2   Compliance with Home Oxygen Use Yes     Goals/Expected Outcomes   Short Term Goals To learn and exhibit compliance with exercise, home and travel O2 prescription;To learn and understand importance of monitoring SPO2 with pulse oximeter and demonstrate accurate use of the pulse oximeter.;To learn and understand importance of maintaining oxygen saturations>88%;To learn and demonstrate proper pursed lip breathing techniques or other breathing techniques.;To learn and demonstrate proper use of respiratory medications   Long  Term Goals Exhibits compliance with exercise, home and travel O2 prescription;Verbalizes importance of monitoring SPO2 with pulse oximeter and return demonstration;Maintenance of O2 saturations>88%;Exhibits proper breathing techniques, such as pursed lip breathing or other method taught during program session;Compliance with respiratory medication;Demonstrates proper use of MDI's   Goals/Expected Outcomes patient compliant with home oxygen requirements and meets goals as stated above      Initial Exercise Prescription:     Initial Exercise Prescription - 06/26/17 1600      Date of Initial Exercise RX and Referring Provider   Date 06/26/17    Referring Provider Dr. Melvyn Novas     Oxygen   Oxygen Continuous   Liters 3     Bike   Level 0.5   Minutes 17     NuStep   Level 2   Minutes 17   METs 1.4     Track   Laps 5   Minutes 17     Prescription Details   Frequency (times per week) 2   Duration Progress to 45 minutes of aerobic exercise without signs/symptoms of physical distress     Intensity   THRR 40-80% of Max Heartrate 65-131   Ratings of Perceived Exertion 11-13   Perceived Dyspnea 0-4     Progression   Progression Continue progressive overload as per policy without signs/symptoms or physical distress.     Resistance Training   Training Prescription Yes   Weight blue bands   Reps 10-15      Perform Capillary Blood Glucose checks as needed.  Exercise Prescription Changes:     Exercise Prescription Changes    Row Name 07/03/17 1200 07/05/17 1200 07/10/17 1200 07/12/17 1200 07/24/17 1200     Response to Exercise   Blood Pressure (Admit) 118/68 138/80 110/68 132/60 122/62   Blood Pressure (Exercise) 126/72 134/84 132/68 130/70 126/82   Blood Pressure (Exit) 114/70 110/70 104/60 116/70 102/64   Heart Rate (Admit) 72 bpm 65 bpm 78  bpm 67 bpm 78 bpm   Heart Rate (Exercise) 104 bpm 104 bpm 108 bpm 95 bpm 98 bpm   Heart Rate (Exit) 75 bpm 80 bpm 71 bpm 67 bpm 85 bpm   Oxygen Saturation (Admit) 92 % 96 % 98 % 98 % 100 %   Oxygen Saturation (Exercise) 89 % 90 % 87 % 89 % 96 %   Oxygen Saturation (Exit) 99 % 98 % 98 % 99 % 97 %   Rating of Perceived Exertion (Exercise) 11 11 13 13 13    Perceived Dyspnea (Exercise) 2 1 3 2 2    Duration Progress to 45 minutes of aerobic exercise without signs/symptoms of physical distress Progress to 45 minutes of aerobic exercise without signs/symptoms of physical distress Progress to 45 minutes of aerobic exercise without signs/symptoms of physical distress Progress to 45 minutes of aerobic exercise without signs/symptoms of physical distress Progress to 45 minutes of aerobic  exercise without signs/symptoms of physical distress   Intensity Other (comment)  40-80% of HRR Other (comment)  40-80% of HRR Other (comment)  40-80% of HRR THRR unchanged THRR unchanged     Progression   Progression Continue to progress workloads to maintain intensity without signs/symptoms of physical distress. Continue to progress workloads to maintain intensity without signs/symptoms of physical distress. Continue to progress workloads to maintain intensity without signs/symptoms of physical distress. Continue to progress workloads to maintain intensity without signs/symptoms of physical distress. Continue to progress workloads to maintain intensity without signs/symptoms of physical distress.     Resistance Training   Training Prescription Yes Yes Yes Yes Yes   Weight blue bands blue bands blue bands blue bands blue bands   Reps 10-15 10-15 10-15 10-15 10-15   Time 10 Minutes 10 Minutes 10 Minutes 10 Minutes 10 Minutes     Oxygen   Oxygen Continuous Continuous Continuous Continuous Continuous   Liters 3 3 3 3 3      Bike   Level 0.5  - 0.5 0.5 0.5   Minutes 17  - 17 17 17      NuStep   Level 2 2 3   - 4   Minutes 17 17 17   - 17   METs 1.9 2.1 2.1  - 2.1     Track   Laps 5 12 11 11   -   Minutes 17 17 17 17   -   Row Name 07/26/17 1200 07/31/17 1600 08/02/17 1200 08/07/17 1200 08/09/17 1200     Response to Exercise   Blood Pressure (Admit) 146/76 102/70 122/76 116/62 116/64   Blood Pressure (Exercise) 150/76 136/74 130/64 122/82 108/70   Blood Pressure (Exit) 122/80 120/70 112/70 114/75 104/68   Heart Rate (Admit) 78 bpm 85 bpm 80 bpm 77 bpm 77 bpm   Heart Rate (Exercise) 107 bpm 93 bpm 110 bpm 105 bpm 94 bpm   Heart Rate (Exit) 90 bpm 82 bpm 81 bpm 81 bpm 73 bpm   Oxygen Saturation (Admit) 93 % 96 % 96 % 95 % 97 %   Oxygen Saturation (Exercise) 88 % 88 % 91 % 90 % 93 %   Oxygen Saturation (Exit) 97 % 97 % 98 % 97 % 98 %   Rating of Perceived Exertion (Exercise) 13 12 13 13  9    Perceived Dyspnea (Exercise) 2 2 2 2 1    Duration Progress to 45 minutes of aerobic exercise without signs/symptoms of physical distress Progress to 45 minutes of aerobic exercise without signs/symptoms of physical distress Progress to  45 minutes of aerobic exercise without signs/symptoms of physical distress Progress to 45 minutes of aerobic exercise without signs/symptoms of physical distress Progress to 45 minutes of aerobic exercise without signs/symptoms of physical distress   Intensity THRR unchanged THRR unchanged THRR unchanged THRR unchanged THRR unchanged     Progression   Progression Continue to progress workloads to maintain intensity without signs/symptoms of physical distress. Continue to progress workloads to maintain intensity without signs/symptoms of physical distress. Continue to progress workloads to maintain intensity without signs/symptoms of physical distress. Continue to progress workloads to maintain intensity without signs/symptoms of physical distress. Continue to progress workloads to maintain intensity without signs/symptoms of physical distress.     Resistance Training   Training Prescription Yes Yes Yes Yes Yes   Weight blue bands b;ue bands b;ue bands b;ue bands b;ue bands   Reps 10-15 10-15 10-15 10-15 10-15   Time 10 Minutes 10 Minutes 10 Minutes 10 Minutes 10 Minutes     Oxygen   Oxygen Continuous  - Continuous Continuous Continuous   Liters 3  - 4 4 4      Bike   Level 0.5 0.5 0.5 0.5  -   Minutes 17 17 17 17   -     NuStep   Level 4 4  - 5 5   Minutes 17 17  - 17 17   METs 1.9 1.9  - 2.1 2     Track   Laps 9  - 12 9 10    Minutes 17  - 17 17 17    Row Name 08/14/17 1200 08/21/17 1300 08/23/17 1200 08/28/17 1200 08/30/17 1200     Response to Exercise   Blood Pressure (Admit) 118/62 142/84 118/80 122/74 116/66   Blood Pressure (Exercise) 138/80 142/84 102/60 112/70 120/70   Blood Pressure (Exit) 100/60 108/72 125/77 106/64 104/70   Heart Rate  (Admit) 84 bpm 75 bpm 80 bpm 80 bpm 83 bpm   Heart Rate (Exercise) 102 bpm 103 bpm 104 bpm 111 bpm 99 bpm   Heart Rate (Exit) 89 bpm 81 bpm 88 bpm 90 bpm 88 bpm   Oxygen Saturation (Admit) 94 % 93 % 96 % 95 % 97 %   Oxygen Saturation (Exercise) 89 % 92 % 87 % 92 % 93 %   Oxygen Saturation (Exit) 98 % 99 % 96 % 98 % 99 %   Rating of Perceived Exertion (Exercise) 14 13 13 14 11    Perceived Dyspnea (Exercise) 2 2 2 3 2    Duration Progress to 45 minutes of aerobic exercise without signs/symptoms of physical distress Progress to 45 minutes of aerobic exercise without signs/symptoms of physical distress Progress to 45 minutes of aerobic exercise without signs/symptoms of physical distress Progress to 45 minutes of aerobic exercise without signs/symptoms of physical distress Progress to 45 minutes of aerobic exercise without signs/symptoms of physical distress   Intensity THRR unchanged THRR unchanged THRR unchanged THRR unchanged THRR unchanged     Progression   Progression Continue to progress workloads to maintain intensity without signs/symptoms of physical distress. Continue to progress workloads to maintain intensity without signs/symptoms of physical distress. Continue to progress workloads to maintain intensity without signs/symptoms of physical distress. Continue to progress workloads to maintain intensity without signs/symptoms of physical distress. Continue to progress workloads to maintain intensity without signs/symptoms of physical distress.     Resistance Training   Training Prescription Yes Yes Yes Yes Yes   Weight b;ue bands b;ue bands b;ue bands b;ue bands blue  bands   Reps 10-15 10-15 10-15 10-15 10-15   Time 10 Minutes 10 Minutes 10 Minutes 10 Minutes 10 Minutes     Oxygen   Oxygen Continuous Continuous Continuous Continuous Continuous   Liters 4 4 4 4 4      Bike   Level 0.5  - 0.5 0.8 0.8   Minutes 17  - 17 17 17      NuStep   Level 5 5 5 5 5    Minutes 17 17 17 17 17     METs 2 1.9 2.1 1.9 1.8     Track   Laps 7 11 11 7   -   Minutes 17 17 17 17   -     Home Exercise Plan   Plans to continue exercise at Home (comment)  -  -  -  -   Frequency Add 3 additional days to program exercise sessions.  -  -  -  -      Exercise Comments:     Exercise Comments    Row Name 08/14/17 1651           Exercise Comments home exercise completed          Exercise Goals and Review:     Exercise Goals    Row Name 06/18/17 1420             Exercise Goals   Increase Physical Activity Yes       Intervention Provide advice, education, support and counseling about physical activity/exercise needs.;Develop an individualized exercise prescription for aerobic and resistive training based on initial evaluation findings, risk stratification, comorbidities and participant's personal goals.       Expected Outcomes Achievement of increased cardiorespiratory fitness and enhanced flexibility, muscular endurance and strength shown through measurements of functional capacity and personal statement of participant.       Increase Strength and Stamina Yes       Intervention Provide advice, education, support and counseling about physical activity/exercise needs.;Develop an individualized exercise prescription for aerobic and resistive training based on initial evaluation findings, risk stratification, comorbidities and participant's personal goals.       Expected Outcomes Achievement of increased cardiorespiratory fitness and enhanced flexibility, muscular endurance and strength shown through measurements of functional capacity and personal statement of participant.          Exercise Goals Re-Evaluation :     Exercise Goals Re-Evaluation    Row Name 07/10/17 5009 07/30/17 1214 09/03/17 0803         Exercise Goal Re-Evaluation   Exercise Goals Review Increase Physical Activity;Increase Strength and Stamina Increase Physical Activity;Increase Strength and Stamina;Able to  understand and use Dyspnea scale;Able to understand and use rate of perceived exertion (RPE) scale;Knowledge and understanding of Target Heart Rate Range (THRR);Understanding of Exercise Prescription Increase Physical Activity;Increase Strength and Stamina;Able to understand and use Dyspnea scale;Able to understand and use rate of perceived exertion (RPE) scale;Knowledge and understanding of Target Heart Rate Range (THRR);Understanding of Exercise Prescription     Comments Patient has only attended two exercise sessions. Will cont. to monitor and progress as able.  Patient is slowly progressing in program. Very excited when lost 1.1 pounds during last session. Very proud of her achievement. States that she is watching her diet. Will complete home exercise.   Patient is progressing well in program. Attendance weighs heavily on getting a ride to rehab. Very motivated to make changes. Is exercising at home. Will cont. to monitor and progress as able.  Expected Outcomes Through exercise and education at rehab and at home, patient will increase strength and stamina. Patient will also gain a better understanding of the need for physical activity on a daily basis and the effects it can have on quality of life. Through exercise and education at rehab and at home, patient will increase strength and stamina. Patient will also gain a better understanding of the need for physical activity on a daily basis and the effects it can have on quality of life. Through exercise and education at rehab and at home, patient will increase strength and stamina. Patient will also gain a better understanding of the need for physical activity on a daily basis and the effects it can have on quality of life.        Discharge Exercise Prescription (Final Exercise Prescription Changes):     Exercise Prescription Changes - 08/30/17 1200      Response to Exercise   Blood Pressure (Admit) 116/66   Blood Pressure (Exercise) 120/70    Blood Pressure (Exit) 104/70   Heart Rate (Admit) 83 bpm   Heart Rate (Exercise) 99 bpm   Heart Rate (Exit) 88 bpm   Oxygen Saturation (Admit) 97 %   Oxygen Saturation (Exercise) 93 %   Oxygen Saturation (Exit) 99 %   Rating of Perceived Exertion (Exercise) 11   Perceived Dyspnea (Exercise) 2   Duration Progress to 45 minutes of aerobic exercise without signs/symptoms of physical distress   Intensity THRR unchanged     Progression   Progression Continue to progress workloads to maintain intensity without signs/symptoms of physical distress.     Resistance Training   Training Prescription Yes   Weight blue bands   Reps 10-15   Time 10 Minutes     Oxygen   Oxygen Continuous   Liters 4     Bike   Level 0.8   Minutes 17     NuStep   Level 5   Minutes 17   METs 1.8      Nutrition:  Target Goals: Understanding of nutrition guidelines, daily intake of sodium <1560m, cholesterol <2040m calories 30% from fat and 7% or less from saturated fats, daily to have 5 or more servings of fruits and vegetables.  Biometrics:     Pre Biometrics - 06/18/17 1447      Pre Biometrics   Grip Strength 29 kg       Nutrition Therapy Plan and Nutrition Goals:     Nutrition Therapy & Goals - 07/09/17 1515      Nutrition Therapy   Diet Therapeutic Lifestyle Changes     Personal Nutrition Goals   Nutrition Goal Identify food quantities necessary to achieve wt loss of 1-2 lb per week to a goal wt loss of 2.7-10.9 kg (6-24 lb) at graduation from pulmonary rehab.   Personal Goal #2 Describe the benefit of including fruits, vegetables, whole grains, and low-fat dairy products in a healthy meal plan.     Intervention Plan   Intervention Prescribe, educate and counsel regarding individualized specific dietary modifications aiming towards targeted core components such as weight, hypertension, lipid management, diabetes, heart failure and other comorbidities.   Expected Outcomes Short Term  Goal: Understand basic principles of dietary content, such as calories, fat, sodium, cholesterol and nutrients.;Long Term Goal: Adherence to prescribed nutrition plan.      Nutrition Discharge: Rate Your Plate Scores:     Nutrition Assessments - 07/09/17 1515      Rate Your Plate Scores  Pre Score 52      Nutrition Goals Re-Evaluation:   Nutrition Goals Discharge (Final Nutrition Goals Re-Evaluation):   Psychosocial: Target Goals: Acknowledge presence or absence of significant depression and/or stress, maximize coping skills, provide positive support system. Participant is able to verbalize types and ability to use techniques and skills needed for reducing stress and depression.  Initial Review & Psychosocial Screening:     Initial Psych Review & Screening - 06/18/17 1438      Initial Review   Current issues with Current Sleep Concerns  sleep deprevation from hot flashes     Family Dynamics   Good Support System? Yes   Comments recent loss of mother     Barriers   Psychosocial barriers to participate in program There are no identifiable barriers or psychosocial needs.     Screening Interventions   Interventions Encouraged to exercise      Quality of Life Scores:   PHQ-9: Recent Review Flowsheet Data    Depression screen [facility] 2/9 06/18/2017 04/12/2017 03/29/2017 03/13/2017 02/05/2017   Decreased Interest 0 0 0 0 0   Down, Depressed, Hopeless 0 0 0 0 0   PHQ - 2 Score 0 0 0 0 0     Interpretation of Total Score  Total Score Depression Severity:  1-4 = Minimal depression, 5-9 = Mild depression, 10-14 = Moderate depression, 15-19 = Moderately severe depression, 20-27 = Severe depression   Psychosocial Evaluation and Intervention:     Psychosocial Evaluation - 06/18/17 1440      Psychosocial Evaluation & Interventions   Interventions Encouraged to exercise with the program and follow exercise prescription   Expected Outcomes patient will remain free from  psychosocial barriers   Continue Psychosocial Services  Follow up required by staff      Psychosocial Re-Evaluation:     Psychosocial Re-Evaluation    [place] Name 07/11/17 1716 08/03/17 1153 08/31/17 1209         Psychosocial Re-Evaluation   Current issues with Current Sleep Concerns Current Sleep Concerns None Identified     Comments recent loss of mother recent loss of mother sleep concerns have resolved     Expected Outcomes patient will remain free from psychosocial barriers to participation in pulmonary rehab patient will remain free from psychosocial barriers to participation in pulmonary rehab patient will remain free from psychosocial barriers to participation in pulmonary rehab     Interventions Encouraged to attend Pulmonary Rehabilitation for the exercise Encouraged to attend Pulmonary Rehabilitation for the exercise Encouraged to attend Pulmonary Rehabilitation for the exercise     Continue Psychosocial Services  Follow up required by staff Follow up required by staff Follow up required by staff        Psychosocial Discharge (Final Psychosocial Re-Evaluation):     Psychosocial Re-Evaluation - 08/31/17 1209      Psychosocial Re-Evaluation   Current issues with None Identified   Comments sleep concerns have resolved   Expected Outcomes patient will remain free from psychosocial barriers to participation in pulmonary rehab   Interventions Encouraged to attend Pulmonary Rehabilitation for the exercise   Continue Psychosocial Services  Follow up required by staff      Education: Education Goals: Education classes will be provided on a weekly basis, covering required topics. Participant will state understanding/return demonstration of topics presented.  Learning Barriers/Preferences:     Learning Barriers/Preferences - 06/18/17 1436      Learning Barriers/Preferences   Learning Barriers None   Learning Preferences Computer/Internet;Group Instruction;Individual  Instruction;Verbal Instruction;Written Material      Education Topics: Risk Factor Reduction:  -Group instruction that is supported by a PowerPoint presentation. Instructor discusses the definition of a risk factor, different risk factors for pulmonary disease, and how the heart and lungs work together.     Nutrition for Pulmonary Patient:  -Group instruction provided by PowerPoint slides, verbal discussion, and written materials to support subject matter. The instructor gives an explanation and review of healthy diet recommendations, which includes a discussion on weight management, recommendations for fruit and vegetable consumption, as well as protein, fluid, caffeine, fiber, sodium, sugar, and alcohol. Tips for eating when patients are short of breath are discussed.   PULMONARY REHAB OTHER RESPIRATORY from 08/30/2017 in [place]  Date  08/30/17  Educator  Nutritionist  Instruction Review Code  R- Review/reinforce      Pursed Lip Breathing:  -Group instruction that is supported by demonstration and informational handouts. Instructor discusses the benefits of pursed lip and diaphragmatic breathing and detailed demonstration on how to preform both.     Oxygen Safety:  -Group instruction provided by PowerPoint, verbal discussion, and written material to support subject matter. There is an overview of "What is Oxygen" and "Why do we need it".  Instructor also reviews how to create a safe environment for oxygen use, the importance of using oxygen as prescribed, and the risks of noncompliance. There is a brief discussion on traveling with oxygen and resources the patient may utilize.   Oxygen Equipment:  -Group instruction provided by [facility] Staff utilizing handouts, written materials, and equipment demonstrations.   Signs and Symptoms:  -Group instruction provided by written material and verbal discussion to support subject matter. Warning signs and  symptoms of infection, stroke, and heart attack are reviewed and when to call the physician/911 reinforced. Tips for preventing the spread of infection discussed.   PULMONARY REHAB OTHER RESPIRATORY from 08/30/2017 in [place]  Date  08/02/17  Educator  RN  Instruction Review Code  2- meets goals/outcomes      Advanced Directives:  -Group instruction provided by verbal instruction and written material to support subject matter. Instructor reviews Advanced Directive laws and proper instruction for filling out document.   Pulmonary Video:  -Group video education that reviews the importance of medication and oxygen compliance, exercise, good nutrition, pulmonary hygiene, and pursed lip and diaphragmatic breathing for the pulmonary patient.   PULMONARY REHAB OTHER RESPIRATORY from 08/30/2017 in [place]  Date  08/09/17  Instruction Review Code  2- meets goals/outcomes      Exercise for the Pulmonary Patient:  -Group instruction that is supported by a PowerPoint presentation. Instructor discusses benefits of exercise, core components of exercise, frequency, duration, and intensity of an exercise routine, importance of utilizing pulse oximetry during exercise, safety while exercising, and options of places to exercise outside of rehab.     PULMONARY REHAB OTHER RESPIRATORY from 08/30/2017 in [place]  Date  07/24/17  Educator  ep  Instruction Review Code  2- meets goals/outcomes      Pulmonary Medications:  -Verbally interactive group education provided by instructor with focus on inhaled medications and proper administration.   PULMONARY REHAB OTHER RESPIRATORY from 08/30/2017 in [place]  Date  07/12/17  Educator  Pharmacist  Instruction Review Code  2- meets goals/outcomes      Anatomy and Physiology of the Respiratory  System and Intimacy:  -Group  instruction provided by PowerPoint, verbal discussion, and written material to support subject matter. Instructor reviews respiratory cycle and anatomical components of the respiratory system and their functions. Instructor also reviews differences in obstructive and restrictive respiratory diseases with examples of each. Intimacy, Sex, and Sexuality differences are reviewed with a discussion on how relationships can change when diagnosed with pulmonary disease. Common sexual concerns are reviewed.   MD DAY -A group question and answer session with a medical doctor that allows participants to ask questions that relate to their pulmonary disease state.   PULMONARY REHAB OTHER RESPIRATORY from 08/30/2017 in [place]  Date  08/21/17  Educator  yacoub  Instruction Review Code  2- meets goals/outcomes      OTHER EDUCATION -Group or individual verbal, written, or video instructions that support the educational goals of the pulmonary rehab program.   Knowledge Questionnaire Score:   Core Components/Risk Factors/Patient Goals at Admission:     Personal Goals and Risk Factors at Admission - 06/18/17 1436      Core Components/Risk Factors/Patient Goals on Admission    Weight Management Obesity;Yes   Intervention Weight Management: Develop a combined nutrition and exercise program designed to reach desired caloric intake, while maintaining appropriate intake of nutrient and fiber, sodium and fats, and appropriate energy expenditure required for the weight goal.;Weight Management/Obesity: Establish reasonable short term and long term weight goals.;Weight Management: Provide education and appropriate resources to help participant work on and attain dietary goals.;Obesity: Provide education and appropriate resources to help participant work on and attain dietary goals.   Expected Outcomes Short Term: Continue to assess and modify interventions until short term weight is  achieved;Long Term: Adherence to nutrition and physical activity/exercise program aimed toward attainment of established weight goal;Understanding of distribution of calorie intake throughout the day with the consumption of 4-5 meals/snacks;Understanding recommendations for meals to include 15-35% energy as protein, 25-35% energy from fat, 35-60% energy from carbohydrates, less than 230m of dietary cholesterol, 20-35 gm of total fiber daily;Weight Loss: Understanding of general recommendations for a balanced deficit meal plan, which promotes 1-2 lb weight loss per week and includes a negative energy balance of [phone] kcal/d   Improve shortness of breath with ADL's Yes   Intervention Provide education, individualized exercise plan and daily activity instruction to help decrease symptoms of SOB with activities of daily living.   Expected Outcomes Short Term: Achieves a reduction of symptoms when performing activities of daily living.   Develop more efficient breathing techniques such as purse lipped breathing and diaphragmatic breathing; and practicing self-pacing with activity Yes   Intervention Provide education, demonstration and support about specific breathing techniuqes utilized for more efficient breathing. Include techniques such as pursed lipped breathing, diaphragmatic breathing and self-pacing activity.   Expected Outcomes Short Term: Participant will be able to demonstrate and use breathing techniques as needed throughout daily activities.   Increase knowledge of respiratory medications and ability to use respiratory devices properly  Yes   Intervention Provide education and demonstration as needed of appropriate use of medications, inhalers, and oxygen therapy.   Expected Outcomes Short Term: Achieves understanding of medications use. Understands that oxygen is a medication prescribed by physician. Demonstrates appropriate use of inhaler and oxygen therapy.      Core Components/Risk  Factors/Patient Goals Review:      Goals and Risk Factor Review    Row Name 07/11/17 1715 08/03/17 1152 08/31/17 1207  Core Components/Risk Factors/Patient Goals Review   Personal Goals Review Weight Management/Obesity;Develop more efficient breathing techniques such as purse lipped breathing and diaphragmatic breathing and practicing self-pacing with activity.;Improve shortness of breath with ADL's Weight Management/Obesity;Develop more efficient breathing techniques such as purse lipped breathing and diaphragmatic breathing and practicing self-pacing with activity.;Improve shortness of breath with ADL's Weight Management/Obesity;Develop more efficient breathing techniques such as purse lipped breathing and diaphragmatic breathing and practicing self-pacing with activity.;Improve shortness of breath with ADL's     Review Patient has attended 3 sessions since admission. She works hard and is tolerating small workload increases. it is too soon to evaluate progress towards goals. Will re-eval in 30 days patient continues to work hard in pulmonary rehab. she has lost 1.1 kg at her last visit. she is watching her caloric intake and making smarter food choices. she is tolerating small workload increases and states she really enjoys coming to pulmonary rehab. patient continues to progress in pulmonary rehab. Her weight remains stable but she is not gaining weight. She continues to tolerate workload increases.     Expected Outcomes see admission expected outcomes see admission expected outcomes see admission expected outcomes        Core Components/Risk Factors/Patient Goals at Discharge (Final Review):      Goals and Risk Factor Review - 08/31/17 1207      Core Components/Risk Factors/Patient Goals Review   Personal Goals Review Weight Management/Obesity;Develop more efficient breathing techniques such as purse lipped breathing and diaphragmatic breathing and practicing self-pacing with  activity.;Improve shortness of breath with ADL's   Review patient continues to progress in pulmonary rehab. Her weight remains stable but she is not gaining weight. She continues to tolerate workload increases.   Expected Outcomes see admission expected outcomes      ITP Comments:   Comments: ITP REVIEW Pt is making expected progress toward pulmonary rehab goals after completing 15 sessions. Recommend continued exercise, life style modification, education, and utilization of breathing techniques to increase stamina and strength and decrease shortness of breath with exertion.

## 2017-09-03 NOTE — Progress Notes (Signed)
Subjective:    Patient ID: [name], female   DOB: May 05, 1961    MRN: [id number]    Brief patient profile:  56 yobf quit smoking 10/2004 dx by transbronchial biopsy 5/92 with NCG inflammatory change consistent with sarcoid. Since that time she's been off and on prednisone multiple  times with flares of coughing dyspnea and skin involvement > weaned off chronic prednisone Feb 2012.   Overall she's been doing much better since she stopped smoking 10/2004 and much better since change to Symbicort 160/4.5 2 two times a day, able to do adl's ok without limitations including some steps. Documented GOLD III copd  07/2011   Placed back on daily steroids for new resp failure 09/2016     History of Present Illness 04/23/2014 f/u ov/[name] re: cough since late May 2015  Chief Complaint  Patient presents with  . Follow-up    Pt reports cough only minimally improved since eval here on 04/13/14. Sometimes coughs until develops HA and vomiting.    Worse at hs and in am ,states already taking omeprazole 20 mg bid ac (though very hesitant with answers re meds and does not have med calendar with action plan as rec previously  rx doxy/ pred and "no better" but no purulent sputum at all now/ actually very little mucus production unless vomits from coughng Never took hycodan Not limited by breathing from desired activities   rec Go ahead and take your cough medication to completely suppress your cough Prednisone 10 mg take  4 each am x 2 days,   2 each am x 2 days,  1 each am x 2 days and stop  Add pepcid ac 20 mg at bedtime and chlortrimeton 4 mg (over the counter) x 2 at bedtime until cough is 100% gone x 2 weeks GERD  Please see patient coordinator before you leave today  to schedule sinus CT> not done   I f not better return in 4 weeks with all meds and med calendar   08/21/2014 f/u ov/Rishard Delange re:  GOLD III copd / no med calendar  Chief Complaint  Patient presents with  . Follow-up    Pt states  having increased DOE just since this am. She was SOB walking from her car to our building today.     did not use saba on day of ov/ saba was clogged up  > rec Pulm rehab> never went to  rehab      05/11/2016  f/u ov/[name] re: GOLD III / symb 160 2bid  Chief Complaint  Patient presents with  . Follow-up    Breathing is overall doing well and she rarely uses rescue inhaler. Needs refills on Symbicort.   working out at the gym but not much aerobics   No tendency to aecopd  rec No change rx/ no need for prednisone   2 weeks prior to Tgiving 2017  progressive doe indolent onset some am cough    Admit date: 10/06/2016 Discharge date: 10/10/2016    Brief/Interim Summary: 56 y.o.woman with a history of COPD, pulmonary sarcoidosis, HTN, breast cancer, and depression who presents to the ED for evaluation of progressive shortness of breath at rest and dyspnea on exertion. Symptoms have been progressive over the past 1-2 weeks. She has had intermittent cough with thick white sputum production (primarily in the mornings). She denies fevers, chills, or sweats. No light-headedness or syncope. No swelling. She has not taken anything over the counter or received any prescriptions as an outpatient  for these symptoms. She uses her Symbicort inhaler twice daily and she has an albuterol rescue inhaler at baseline  COPD exacerbation with acute hypoxic respiratory failure complicated by known history of sarcoidosis with mildly progressive changes on CT scan of her chest. -Patient reports overall improvement since admission -Lungs remain clear without audible wheezing -patient remains stable, however continues to desaturate on minimal exertion -have consulted pulmonary for assistance -Patient found to have neg respiratory viral culture, unremarkable 2d echo -Pulmonary recommended trial of lasix x 1 dose with good urine output, however patient remains supplemental O2 dependent -Pulmonary recommends  2-3 week prednisone taper -See nursing documentation, Patient requires continuous home O2  Atypical chest discomfort in the setting of hypoxia -Remains chest pain-free -This admission, serial troponins were negative 3 -Presenting EKG unremarkable  -Likely chest wall pain secondary to COPD  HTN -Blood pressure remained stable -Patient continued on metoprolol and hydrochlorothiazide  GERD -Remains stable currently -Continue pantoprazole  History of breast cancer --Continue Arimidex Currently stable  Depression --Continue Zoloft Appear stable at present  Discharge Diagnoses:    COPD exacerbation ([place])    Sarcoidosis ([place])   HYPERTENSION, BENIGN SYSTEMIC   Depression with anxiety   Difficulty in walking, not elsewhere classified   Acute on chronic respiratory failure ([place])   Malnutrition of moderate degree    10/27/2016 extended post hosp f/u ov/[name]/ transition of care  re: Sarcoid/  GOLD III copd/ symb 160 2bid  Chief Complaint  Patient presents with  . Follow-up    little troble breathing when decreased prednisone down to 10mg , hard to maneuver around the house even on oxygen at 2L  feels better on 02 and d/c on pred 60 > 20 and fine until reduced to 10 x 4 days then worse sob  Prior to reducing the reducing the dose of prednisone able to walk around home s 02 and now desat 70s on 02  Different from sarcoid because with sarcoid had assoc cough / similar to sarcoid in terms of appetite, worse off it/ better on it  rec Resume prednisone 20 mg daily until next office visit Late add: If she is going to continue to need systemic steroids, a good option for her might be lama/laba s the the ics eg BEVESPI, will  Re-consider at next ov    11/09/2016  f/u ov/[name] re:   Sarcoid / GOLD III pred still on 20 mg daily  Chief Complaint  Patient presents with  . Follow-up    Breathing is unchanged. She noticed some wheezing a day ago. She is using albuterol inhaler 2 x per  wk on average.    doe = MMRC3 = can't walk 100 yards even at a slow pace at a flat grade s stopping due to sob  Even on 02  rec We will contact advanced to get your oxygen humidified  Stop symbicort and try bevespi Take 2 puffs first thing in am and then another 2 puffs about 12 hours later.  Only use your albuterol as a rescue medication    01/09/2017  f/u ov/[name] re:  GOLD III/ pred 10 and bevespi 2 bid / alb rare Chief Complaint  Patient presents with  . Follow-up    Pt states breathing is "a little rough for me today".  She is concerned about wt gain.   doe still = MMRC3  No better p saba rec Add pepcid 20 mg at bedtime Add 02 2lpm at bedtime and continue to adjust daytime to saturations  greater than 90%  See calendar for specific medication instructions    02/20/2017  f/u ov/[name] re:   GOLD III/ pred 10 mg daily and bevespi 2bid  and alb rare  Chief Complaint  Patient presents with  . Follow-up    6wk rov. pt states breathing is baseline. pt reports of sob with exertion & occ prod cough with green mucus mainly in the morning   Doe better :  [facility] = can't walk a nl pace on a flat grade s sob but does fine slow and flat eg shopping on  2lpm / ok at rest s 02  Uses 02 2lpm sleeping  rec Drop prednisone to 10 mg one half daily      NP eval 05/14/17 It is good to see you today. We will decrease your prednisone to 2.5 mg daily. We will prescribe 5 mg tablets. We will renew your prescription for Symbicort.  2 puffs twice daily. Rinse mouth after use. Follow up appointment with Dr. Melvyn Novas in 2 months. We will refer you to Pulmonary rehab > started Aug 2018  Referral to a dietician for weight loss >  at rehab     07/17/2017  f/u ov/[name] re:   Sarcoid/ ? Still steroid dep at 2.5mg  daily / 02 none at rest, 2lpm with activity and 3lpm ex  Chief Complaint  Patient presents with  . Follow-up    COPD, still using Symbicort, still SOB with exertion, she has to increase her oxygen to 3L    Doing 3lpm when exercising at rehab and no need for saba on symb 160 2bid  Keeping up with med calendar much better  rec Try prednisone 5 mg take one half on even days only Make a food diary for your dietician to review (list everything you eat/drink x one week for her feedback on what you are doing right vs wrong) See Tammy NP 3 months with all your medications    09/03/2017  f/u ov/[name] re: disability paperwork on 5 mg even days with h/o sarcoid and severe copd  Chief Complaint  Patient presents with  . Follow-up    Pt states that her breathing has improved with pulmonary rehab. She is taking 5 mg pred every other day and using ventolin 3-4 x per wk.     last worked Oct 04 2016  02 2lpm sitting/ 3lpm with activity and 4lpm with rehab  Can do 15 min on new step machine  s stopping on "5"  And stationery bike at home x 15 min at "3"  Had worked [employer] / carrying 5lb to desk fiinish it  =  100 ft and handed to next person  Lint was bad at workplace and seemed to aggravate cough/ sob  maint symb 160 2bid bid and use saba qod when goes outside eg to mailbox   Has had independent eval x one month prior to OV     Most am's sob going to br before am symbicort/sleeping ok on  on one pillow    No obvious day to day or daytime variability or assoc excess/ purulent sputum or mucus plugs or hemoptysis or cp or chest tightness, subjective wheeze or overt sinus or hb symptoms. No unusual exp hx or h/o childhood pna/ asthma or knowledge of premature birth.  Sleeping ok flat without nocturnal  or early am exacerbation  of respiratory  c/o's or need for noct saba. Also denies any obvious fluctuation of symptoms with weather or environmental changes  or other aggravating or alleviating factors except as outlined above   Current Allergies, Complete Past Medical History, Past Surgical History, Family History, and Social History were reviewed in [employer] record.  ROS   The following are not active complaints unless bolded Hoarseness, sore throat, dysphagia, dental problems, itching, sneezing,  nasal congestion or discharge of excess mucus or purulent secretions, ear ache,   fever, chills, sweats, unintended wt loss or wt gain, classically pleuritic or exertional cp,  orthopnea pnd or leg swelling, presyncope, palpitations, abdominal pain, anorexia, nausea, vomiting, diarrhea  or change in bowel habits or change in bladder habits, change in stools or change in urine, dysuria, hematuria,  rash, arthralgias, visual complaints, headache, numbness, weakness or ataxia or problems with walking or coordination,  change in mood/affect or memory.        Current Meds - not able to verify/ did not bring med calendar   Medication Sig  . acetaminophen (TYLENOL) 500 MG tablet Take 1,000 mg by mouth every 6 (six) hours as needed for fever. Reported on 05/10/2016  . albuterol (VENTOLIN HFA) 108 (90 Base) MCG/ACT inhaler Inhale 2 puffs into the lungs every 6 (six) hours as needed for wheezing or shortness of breath.  . anastrozole (ARIMIDEX) 1 MG tablet Take 1 tablet (1 mg total) by mouth at bedtime.  Marland Kitchen atorvastatin (LIPITOR) 20 MG tablet Take 1 tablet (20 mg total) by mouth daily.  . budesonide-formoterol (SYMBICORT) 160-4.5 MCG/ACT inhaler Inhale 2 puffs into the lungs 2 (two) times daily.  . diphenhydrAMINE (BENADRYL) 25 MG tablet Take 1 tablet (25 mg total) by mouth every 6 (six) hours as needed.  . fluticasone (FLONASE) 50 MCG/ACT nasal spray Place 2 sprays into both nostrils daily as needed for allergies.  Marland Kitchen gabapentin (NEURONTIN) 300 MG capsule Take 1 capsule (300 mg total) by mouth at bedtime.  . hydrochlorothiazide (HYDRODIURIL) 12.5 MG tablet   . ibuprofen (ADVIL,MOTRIN) 600 MG tablet Take 1 tablet (600 mg total) by mouth every 8 (eight) hours as needed.  . metoprolol tartrate (LOPRESSOR) 25 MG tablet Take 1 tablet (25 mg total) by mouth 2 (two) times daily.  . Multiple  Vitamin (MULTIVITAMIN) capsule Take 1 capsule by mouth daily.  Marland Kitchen olopatadine (PATANOL) 0.1 % ophthalmic solution   . omeprazole (PRILOSEC) 20 MG capsule TAKE ONE CAPSULE BY MOUTH  DAILY  . OXYGEN 2lpm with rest and 3-4 lpm with exertion  . oxymetazoline (AFRIN NASAL SPRAY) 0.05 % nasal spray Place 1 spray into both nostrils 2 (two) times daily.  . predniSONE (DELTASONE) 5 MG tablet Take 5 mg by mouth every other day.   . 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 Unice Cobble  -h/o Daily prednisone since 2007   > off completely Feb 2012 with no problem> restarted  09/2016  ? of SUPERFICIAL VEIN THROMBOSIS (ICD-453.9)   ENDOMETRIAL POLYP (ICD-621.0)   UTERINE FIBROID (ICD-218.9)  RHINITIS, ALLERGIC (ICD-477.9)  HYPERTENSION, BENIGN SYSTEMIC (ICD-401.1)  COPD (ICD-496)  - PFT's 06/26/08 FEV1 1.09 ratio  - PFTs 03/02/10 FEV1 1.06 ratio 34  - PFT's 08/01/2011 FEV1 1.6 (42%)  41 ratio  DLCO 55 corrects 77 - HFA  50% 06/05/2011  > 75% 06/29/2011  - Spiriva trial March 02, 2010 > better but  no worse off 04/2011 - Rehab started mid aug 2018  BACK PAIN, LOW (ICD-724.2)  ANEMIA, IRON DEFICIENCY, UNSPEC. (ICD-280.9)  Health Maintenance.......................................................   [facility]   Family History:  crohn`s in daughter, M-lupus, htn, asthma,  no Ca, DM, CAD    Social History:   lives with twin children and grandson. single; >20 pack year history, quit in 2005; no EtOH  Laid off  from works for [employer] Sept 2013           Objective:   Physical Exam wt 166 Oct 12, 2009 >170 October 28, 2010 > 169 06/29/2011 >>171 08/24/2011 > 01/15/2012  171 > 04/24/2012 176 > 06/28/2012  175>  08/13/2012 > 08/30/2012  173 > 170 11/28/2012 > 01/29/2013  176 > 174 >173 06/16/2013 > 171 09/18/2013 > 12/19/2013 177 >179 04/13/2014>  04/23/2014 177 > 08/21/2014 184 >182 11/27/2014 > 05/12/2015 177  > 05/11/2016   177 > 10/27/2016  175 > 11/09/2016 182 > 01/09/2017 198 > 02/20/2017  206 >  07/17/2017   209 >  09/03/2017   210   amb bf nad / vital signs reviewed   -  Note on arrival 02 sats  95% on 2lpm POC pulsed   HEENT: nl  oropharynx. Nl external ear canals without cough reflex - edentulous / moderate bilateral non-specific turbinate edema     NECK :  without JVD/Nodes/TM/ nl carotid upstrokes bilaterally   LUNGS: no acc muscle use, lungs with distant bs bilaterally but no adventitial Breath sounds    CV:  RRR  no s3 or murmur or increase in P2, no edema   ABD:  soft and nontender with nl excursion in the supine position. No bruits or organomegaly, bowel sounds nl  MS:  warm without deformities, calf tenderness, cyanosis or clubbing  SKIN: warm and dry without lesions    NEURO:  alert, approp, no deficits       I personally reviewed images and agree with radiology impression as follows:  CXR:   03/16/17 1. No acute cardiopulmonary findings. 2. Emphysematous change. 3. RIGHT mid lung scarring and atelectasis.     . Assessment:

## 2017-09-04 ENCOUNTER — Encounter: Payer: Self-pay | Admitting: Internal Medicine

## 2017-09-04 ENCOUNTER — Encounter (HOSPITAL_COMMUNITY)
Admission: RE | Admit: 2017-09-04 | Discharge: 2017-09-04 | Disposition: A | Discharge status: Home / Self Care | Payer: BLUE CROSS/BLUE SHIELD | Source: Ambulatory Visit | Attending: Internal Medicine | Admitting: Internal Medicine

## 2017-09-04 VITALS — Wt 209.9 lb

## 2017-09-04 DIAGNOSIS — D860 Sarcoidosis of lung: Secondary | ICD-10-CM

## 2017-09-04 DIAGNOSIS — R0609 Other forms of dyspnea: Secondary | ICD-10-CM | POA: Diagnosis not present

## 2017-09-04 NOTE — Progress Notes (Signed)
Daily Session Note  Patient Details  Name: [name] MRN: [id number] Date of Birth: 19-Dec-1960 Referring Provider:     Pulmonary Rehab Walk Test from 06/26/2017 in [place]  Referring Provider  Dr. Melvyn Novas      Encounter Date: 09/04/2017  Check In:     Session Check In - 09/04/17 1030      Check-In   Location [facility]   Staff Present Rosebud Poles, RN, BSN;Merced Hanners Ysidro Evert, RN;Portia Rollene Rotunda, RN, BSN;Molly diVincenzo, MS, ACSM RCEP, Exercise Physiologist   Supervising physician immediately available to respond to emergencies [facility] immediately available   Physician(s) Dr. Horris Latino   Medication changes reported     No   Fall or balance concerns reported    No   Tobacco Cessation No Change   Warm-up and Cool-down Performed as group-led instruction   Resistance Training Performed Yes   VAD Patient? No     Pain Assessment   Currently in Pain? No/denies   Multiple Pain Sites No      Capillary Blood Glucose: No results found for this or any previous visit (from the past 24 hour(s)).      Exercise Prescription Changes - 09/04/17 1100      Response to Exercise   Blood Pressure (Admit) 102/60   Blood Pressure (Exercise) 104/70   Blood Pressure (Exit) 114/60   Heart Rate (Admit) 87 bpm   Heart Rate (Exercise) 104 bpm   Heart Rate (Exit) 83 bpm   Oxygen Saturation (Admit) 96 %   Oxygen Saturation (Exercise) 90 %   Oxygen Saturation (Exit) 98 %   Rating of Perceived Exertion (Exercise) 12   Perceived Dyspnea (Exercise) 2   Duration Progress to 45 minutes of aerobic exercise without signs/symptoms of physical distress   Intensity THRR unchanged     Progression   Progression Continue to progress workloads to maintain intensity without signs/symptoms of physical distress.     Resistance Training   Training Prescription Yes   Weight blue bands   Reps 10-15   Time 10 Minutes     Oxygen   Oxygen Continuous   Liters 4     Bike   Level 1   Minutes 17     NuStep   Level 6   Minutes 17   METs 1.9     Track   Laps 10   Minutes 17      History  Smoking Status  . Former Smoker  . Packs/day: 1.00  . Years: 20.00  . Quit date: 11/14/2003  Smokeless Tobacco  . Never Used    Goals Met:  Exercise tolerated well No report of cardiac concerns or symptoms Strength training completed today  Goals Unmet:  Not Applicable  Comments: Service time is from 1030 to 1200    Dr. Rush Farmer is Medical Director for Pulmonary Rehab at [facility].

## 2017-09-04 NOTE — Assessment & Plan Note (Addendum)
Quit smoking 2005  - PFT's 06/26/08 FEV1 1.09 ratio  - PFTs 03/02/10 FEV1 1.06 ratio 34    PFT's 08/01/2011 FEV1 1.6 (42%)  41 ratio  DLCO 55 corrects 77 - Spiriva trial March 02, 2010 > ? better but not worse off it 04/2011 - 05/12/2015 p extensive coaching HFA effectiveness =    90%  - Referred to Rehab 08/21/2014 > could not arrange due to schedule  -med calendar 06/16/2013 > did not bring to office as requested 12/19/13 or 04/23/14  Or 08/21/2014  - 11/09/2016    changed symb to bevespi since taking chronic pred for pf    - 02/20/2017 decreased pred to 5 mg daily > weaned to 2.5 mg qd as of 07/17/2017  - started rehab mid Aug 2018  - 07/17/2017 changed to pred 2.5 even days > did not tolerate so changed  Around  07/28/17 back to 5 mg qod    - 09/03/2017  After extensive coaching HFA effectiveness =    90% so try stiolto  - Spirometry 09/03/2017  FEV1 0.52 (23%)  Ratio 40 with typical curvature    Pt improving somewhat with rehab but still  Group B in terms of symptom/risk and laba/lama therefore appropriate rx at this point.   Will try stiolto and continue qod pred (for airways and sarcoid.  Formulary restrictions will be an ongoing challenge for the forseable future and I would be happy to pick an alternative if the pt will first  provide me a list of them but pt  will need to return here for training for any new device that is required eg dpi vs hfa vs respimat.    In meantime we can always provide samples so the patient never runs out of any needed respiratory medications.    I had an extended discussion with the patient reviewing all relevant studies completed to date and  lasting 15 to 20 minutes of a 25 minute visit    Each maintenance medication was reviewed in detail including most importantly the difference between maintenance and prns and under what circumstances the prns are to be triggered using an action plan format that is not reflected in the computer generated alphabetically organized  AVS.    Please see AVS for specific instructions unique to this visit that I personally wrote and verbalized to the the pt in detail and then reviewed with pt  by my nurse highlighting any  changes in therapy recommended at today's visit to their plan of care.

## 2017-09-04 NOTE — Assessment & Plan Note (Addendum)
Did not keep appt 06/28/17 for nutrition eval  - complicated by hbp/ hyperlipidemia    Body mass index is 34.95 kg/m.  -  trending  up Lab Results  Component Value Date   TSH 0.66 11/09/2016     Contributing to gerd risk/ doe/reviewed the need and the process to achieve and maintain neg calorie balance > defer f/u primary care including intermittently monitoring thyroid status

## 2017-09-04 NOTE — Assessment & Plan Note (Signed)
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

## 2017-09-05 NOTE — Telephone Encounter (Signed)
Rec'd uncompleted form back from [facility] - stating pt needs ov - don't see an appointment scheduled - fwd back to [facility] via interoffice mail - pr

## 2017-09-06 ENCOUNTER — Encounter (HOSPITAL_COMMUNITY)
Admission: RE | Admit: 2017-09-06 | Discharge: 2017-09-06 | Disposition: A | Discharge status: Home / Self Care | Payer: BLUE CROSS/BLUE SHIELD | Source: Ambulatory Visit | Attending: Internal Medicine | Admitting: Internal Medicine

## 2017-09-06 VITALS — Wt 208.8 lb

## 2017-09-06 DIAGNOSIS — D860 Sarcoidosis of lung: Secondary | ICD-10-CM

## 2017-09-06 DIAGNOSIS — R0609 Other forms of dyspnea: Secondary | ICD-10-CM | POA: Diagnosis not present

## 2017-09-06 NOTE — Progress Notes (Signed)
Daily Session Note  Patient Details  Name: [name] MRN: [id number] Date of Birth: 09/08/1961 Referring Provider:     Pulmonary Rehab Walk Test from 06/26/2017 in [place]  Referring Provider  Dr. Melvyn Novas      Encounter Date: 09/06/2017  Check In:     Session Check In - 09/06/17 1054      Check-In   Location [facility]   Staff Present Su Hilt, MS, ACSM RCEP, Exercise Physiologist;Joan Leonia Reeves, RN, BSN;Mekai Wilkinson Ysidro Evert, RN;Portia Rollene Rotunda, RN, BSN   Supervising physician immediately available to respond to emergencies [facility] immediately available   Physician(s) Dr. Wendee Beavers   Medication changes reported     No   Fall or balance concerns reported    No   Tobacco Cessation No Change   Warm-up and Cool-down Performed as group-led instruction   Resistance Training Performed Yes   VAD Patient? No     Pain Assessment   Currently in Pain? No/denies   Multiple Pain Sites No      Capillary Blood Glucose: No results found for this or any previous visit (from the past 24 hour(s)).      Exercise Prescription Changes - 09/06/17 1200      Response to Exercise   Blood Pressure (Admit) 118/60   Blood Pressure (Exercise) 120/76   Blood Pressure (Exit) 114/60   Heart Rate (Admit) 84 bpm   Heart Rate (Exercise) 110 bpm   Heart Rate (Exit) 86 bpm   Oxygen Saturation (Admit) 95 %   Oxygen Saturation (Exercise) 92 %   Oxygen Saturation (Exit) 97 %   Rating of Perceived Exertion (Exercise) 13   Perceived Dyspnea (Exercise) 2   Duration Progress to 45 minutes of aerobic exercise without signs/symptoms of physical distress   Intensity THRR unchanged     Progression   Progression Continue to progress workloads to maintain intensity without signs/symptoms of physical distress.     Resistance Training   Training Prescription Yes   Weight blue bands   Reps 10-15   Time 10 Minutes     Oxygen   Oxygen Continuous   Liters  4     Bike   Level 1   Minutes 17     Track   Laps 12   Minutes 17      History  Smoking Status  . Former Smoker  . Packs/day: 1.00  . Years: 20.00  . Quit date: 11/14/2003  Smokeless Tobacco  . Never Used    Goals Met:  Exercise tolerated well No report of cardiac concerns or symptoms Strength training completed today  Goals Unmet:  Not Applicable  Comments: Service time is from 1030 to 1230    Dr. Rush Farmer is Medical Director for Pulmonary Rehab at [facility].

## 2017-09-07 ENCOUNTER — Other Ambulatory Visit: Payer: Self-pay

## 2017-09-07 ENCOUNTER — Encounter (HOSPITAL_COMMUNITY): Payer: Self-pay | Admitting: *Deleted

## 2017-09-07 ENCOUNTER — Observation Stay (HOSPITAL_COMMUNITY)
Admission: EM | Admit: 2017-09-07 | Discharge: 2017-09-09 | Disposition: A | Discharge status: Home / Self Care | Payer: BLUE CROSS/BLUE SHIELD | Attending: Family Medicine | Admitting: Family Medicine

## 2017-09-07 ENCOUNTER — Emergency Department (HOSPITAL_COMMUNITY): Payer: BLUE CROSS/BLUE SHIELD

## 2017-09-07 DIAGNOSIS — Z79899 Other long term (current) drug therapy: Secondary | ICD-10-CM | POA: Diagnosis not present

## 2017-09-07 DIAGNOSIS — J449 Chronic obstructive pulmonary disease, unspecified: Secondary | ICD-10-CM | POA: Diagnosis not present

## 2017-09-07 DIAGNOSIS — R079 Chest pain, unspecified: Principal | ICD-10-CM | POA: Diagnosis present

## 2017-09-07 DIAGNOSIS — F329 Major depressive disorder, single episode, unspecified: Secondary | ICD-10-CM | POA: Diagnosis not present

## 2017-09-07 DIAGNOSIS — Z87891 Personal history of nicotine dependence: Secondary | ICD-10-CM | POA: Diagnosis not present

## 2017-09-07 DIAGNOSIS — D869 Sarcoidosis, unspecified: Secondary | ICD-10-CM | POA: Insufficient documentation

## 2017-09-07 DIAGNOSIS — I10 Essential (primary) hypertension: Secondary | ICD-10-CM | POA: Diagnosis not present

## 2017-09-07 DIAGNOSIS — R110 Nausea: Secondary | ICD-10-CM | POA: Diagnosis not present

## 2017-09-07 DIAGNOSIS — Z853 Personal history of malignant neoplasm of breast: Secondary | ICD-10-CM | POA: Insufficient documentation

## 2017-09-07 DIAGNOSIS — Z9981 Dependence on supplemental oxygen: Secondary | ICD-10-CM | POA: Insufficient documentation

## 2017-09-07 DIAGNOSIS — D573 Sickle-cell trait: Secondary | ICD-10-CM | POA: Diagnosis not present

## 2017-09-07 DIAGNOSIS — Z7982 Long term (current) use of aspirin: Secondary | ICD-10-CM | POA: Insufficient documentation

## 2017-09-07 DIAGNOSIS — F419 Anxiety disorder, unspecified: Secondary | ICD-10-CM | POA: Diagnosis not present

## 2017-09-07 DIAGNOSIS — R0789 Other chest pain: Secondary | ICD-10-CM | POA: Diagnosis not present

## 2017-09-07 DIAGNOSIS — F32A Depression, unspecified: Secondary | ICD-10-CM

## 2017-09-07 LAB — BASIC METABOLIC PANEL
Anion gap: 10 (ref 5–15)
BUN: 11 mg/dL (ref 6–20)
CO2: 32 mmol/L (ref 22–32)
CREATININE: 0.91 mg/dL (ref 0.44–1.00)
Calcium: 9.8 mg/dL (ref 8.9–10.3)
Chloride: 98 mmol/L — ABNORMAL LOW (ref 101–111)
GFR calc Af Amer: >60 mL/min (ref >60)
Glucose, Bld: 105 mg/dL — ABNORMAL HIGH (ref 65–99)
Potassium: 4.5 mmol/L (ref 3.5–5.1)
SODIUM: 140 mmol/L (ref 135–145)

## 2017-09-07 LAB — CBC
HCT: 35.9 % — ABNORMAL LOW (ref 36.0–46.0)
Hemoglobin: 11.4 g/dL — ABNORMAL LOW (ref 12.0–15.0)
MCH: 25.5 pg — ABNORMAL LOW (ref 26.0–34.0)
MCHC: 31.8 g/dL (ref 30.0–36.0)
MCV: 80.3 fL (ref 78.0–100.0)
PLATELETS: 303 10*3/uL (ref 150–400)
RBC: 4.47 MIL/uL (ref 3.87–5.11)
RDW: 14.7 % (ref 11.5–15.5)
WBC: 7.5 10*3/uL (ref 4.0–10.5)

## 2017-09-07 LAB — I-STAT TROPONIN, ED
TROPONIN I, POC: 0 ng/mL (ref 0.00–0.08)
Troponin i, poc: 0 ng/mL (ref 0.00–0.08)

## 2017-09-07 MED ORDER — UMECLIDINIUM BROMIDE 62.5 MCG/INH IN AEPB
1.0000 | INHALATION_SPRAY | Freq: Every day | RESPIRATORY_TRACT | Status: DC
  Administered 2017-09-09: 10:00:00 1 via RESPIRATORY_TRACT
  Filled 2017-09-07 (×2): qty 7

## 2017-09-07 MED ORDER — GI COCKTAIL ~~LOC~~
30.0000 mL | Freq: Once | ORAL | Status: AC
  Administered 2017-09-07: 30 mL via ORAL
  Filled 2017-09-07: qty 30

## 2017-09-07 MED ORDER — NITROGLYCERIN 0.4 MG SL SUBL
0.4000 mg | SUBLINGUAL_TABLET | SUBLINGUAL | Status: AC | PRN
  Administered 2017-09-07 (×3): 0.4 mg via SUBLINGUAL
  Filled 2017-09-07: qty 1

## 2017-09-07 MED ORDER — ASPIRIN 81 MG PO CHEW
324.0000 mg | CHEWABLE_TABLET | Freq: Once | ORAL | Status: AC
  Administered 2017-09-07: 324 mg via ORAL
  Filled 2017-09-07: qty 4

## 2017-09-07 MED ORDER — ARFORMOTEROL TARTRATE 15 MCG/2ML IN NEBU
15.0000 ug | INHALATION_SOLUTION | Freq: Two times a day (BID) | RESPIRATORY_TRACT | Status: DC
  Administered 2017-09-08 – 2017-09-09 (×3): 15 ug via RESPIRATORY_TRACT
  Filled 2017-09-07 (×4): qty 2

## 2017-09-07 NOTE — ED Triage Notes (Signed)
Pt reports L sided CP onset last night with SOB, pt uses 2 L [place] PRN with exertion at baseline, pt has sarcodosis and COPD, denies current n/v/d,  pt A&O x4

## 2017-09-07 NOTE — ED Provider Notes (Signed)
[facility] EMERGENCY DEPARTMENT Provider Note   CSN: [id number] Arrival date & time: 09/07/17  1503     History   Chief Complaint Chief Complaint  Patient presents with  . Chest Pain    HPI [name] is a 56 y.o. female.  Patient presents with intermittent chest pressure since last night.  This is different than her reflux or COPD history.  Patient on 2 L nasal cannula and chronic steroids.  No specific exertional symptoms.  No fevers or chills.  No significant cough.  No recent stress test.      Past Medical History:  Diagnosis Date  . Allergic rhinitis   . Breast cancer ([place]) 2016   right breast  . COPD, mild ([place]) FOLLOWED BY DR Melvyn Novas  . GERD (gastroesophageal reflux disease)   . HTN (hypertension)   . Hypertension   . Iron deficiency anemia   . Long-term current use of steroids SYMBICORT INHALER  . 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 Personal history of radiation therapy 2016  . Sarcoidosis STABLE PER CXR JUNE 2013  . Shortness of breath   . Sickle cell trait [facility])     Patient Active Problem List   Diagnosis Date Noted  . Knee pain 03/29/2017  . Right ankle pain 03/29/2017  . Obesity (BMI 30-39.9) 01/10/2017  . Dyspnea on exertion 11/09/2016  . Chronic respiratory failure with hypoxia and hypercapnia ([place]) 10/29/2016  . Malnutrition of moderate degree 10/10/2016  . Acute on chronic respiratory failure ([place]) 10/09/2016  . Difficulty in walking, not elsewhere classified   . Depression with anxiety 08/07/2016  . Subacromial impingement of left shoulder 07/19/2016  . Overweight (BMI 25.0-29.9) 05/11/2016  . Hot flashes 02/01/2016  . Constipation 01/13/2016  . Breast cancer of upper-outer quadrant of right female breast ([place]) 08/04/2015  . Tibial pain 06/03/2015  . Rotator cuff impingement syndrome 09/14/2014  . Menopausal syndrome (hot flashes) 01/19/2014  . GERD (gastroesophageal reflux disease) 12/21/2012  . Hyperlipidemia 12/21/2012  .  SICKLE-CELL TRAIT 04/15/2008  . Sarcoidosis ([place]) 01/10/2007  . ANEMIA, IRON DEFICIENCY, UNSPEC. 01/10/2007  . HYPERTENSION, BENIGN SYSTEMIC 01/10/2007  . 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 ADJUSTABLE SUTURE MANIPULATION  05/22/2012   Procedure: ADJUSTABLE SUTURE MANIPULATION;  Surgeon: Dara Hoyer, MD;  Location: [facility];  Service: Ophthalmology;  Laterality: Right;  . BREAST LUMPECTOMY Right 2016  . CESAREAN SECTION  1986   W/ BILATERAL TUBAL LIGATION  . EYE SURGERY    . MEDIAN RECTUS REPAIR  05/22/2012   Procedure: MEDIAN RECTUS REPAIR;  Surgeon: Dara Hoyer, MD;  Location: [facility]  Service: Ophthalmology;  Laterality: Bilateral;  INFERIOR RECTUS RESECTION WITH ADJUSTIBLE SUTURES RIGHT EYE   . RADIOACTIVE SEED GUIDED MASTECTOMY WITH AXILLARY SENTINEL LYMPH NODE BIOPSY Right 08/18/2015   Procedure: RADIOACTIVE SEED GUIDED PARTIAL MASTECTOMY WITH AXILLARY SENTINEL LYMPH NODE BIOPSY;  Surgeon: Stark Klein, MD;  Location: [facility];  Service: General;  Laterality: Right;  . TOTAL ROBOTIC ASSISTED LAPAROSCOPIC HYSTERECTOMY  12-30-2010   SYMPTOMATIC UTERINE FIBROIDS  . UPPER TEETH EXTRACTION'S  1992    OB History    No data available       Home Medications    Prior to Admission medications   Medication Sig Start Date End Date Taking? Authorizing Provider  acetaminophen (TYLENOL) 500 MG tablet Take 1,000 mg by mouth  every 6 (six) hours as needed for fever. Reported on 05/10/2016   Yes [provider]  albuterol (VENTOLIN HFA) 108 (90 Base) MCG/ACT inhaler Inhale 2 puffs into the lungs every 6 (six) hours as needed for wheezing or shortness of breath. 03/12/17  Yes Tanda Rockers, MD  anastrozole (ARIMIDEX) 1 MG tablet Take 1 tablet (1 mg total) by mouth at bedtime. Patient taking differently: Take 1 mg by mouth daily.   12/13/16  Yes Nicholas Lose, MD  aspirin EC 81 MG tablet Take 81 mg by mouth daily.   Yes [provider]  atorvastatin (LIPITOR) 20 MG tablet Take 1 tablet (20 mg total) by mouth daily. 02/14/17  Yes Nicolette Bang, DO  diphenhydrAMINE (BENADRYL) 25 MG tablet Take 1 tablet (25 mg total) by mouth every 6 (six) hours as needed. 02/05/17  Yes Nicolette Bang, DO  fluticasone (FLONASE) 50 MCG/ACT nasal spray Place 2 sprays into both nostrils daily as needed for allergies. 05/10/17  Yes Nicolette Bang, DO  gabapentin (NEURONTIN) 300 MG capsule Take 1 capsule (300 mg total) by mouth at bedtime. Patient taking differently: Take 600 mg by mouth at bedtime.  02/06/17  Yes Nicholas Lose, MD  ibuprofen (ADVIL,MOTRIN) 600 MG tablet Take 1 tablet (600 mg total) by mouth every 8 (eight) hours as needed. 08/03/17  Yes Nicolette Bang, DO  metoprolol tartrate (LOPRESSOR) 25 MG tablet Take 1 tablet (25 mg total) by mouth 2 (two) times daily. 06/04/17  Yes Nicolette Bang, DO  Multiple Vitamin (MULTIVITAMIN) capsule Take 1 capsule by mouth daily.   Yes [provider]  olopatadine (PATANOL) 0.1 % ophthalmic solution  06/07/17  Yes [provider]  omeprazole (PRILOSEC) 20 MG capsule TAKE ONE CAPSULE BY MOUTH  DAILY Patient taking differently: TAKE 20 mgCAPSULE BY MOUTH  DAILY 03/29/17  Yes Donnamae Jude, MD  OXYGEN 2lpm with rest and 3-4 lpm with exertion   Yes [provider]  oxymetazoline (AFRIN NASAL SPRAY) 0.05 % nasal spray Place 1 spray into both nostrils 2 (two) times daily. Patient taking differently: Place 1 spray into both nostrils daily as needed.  02/05/17  Yes Nicolette Bang, DO  predniSONE (DELTASONE) 5 MG tablet Take 5 mg by mouth every other day.    Yes [provider]  Tiotropium Bromide-Olodaterol (STIOLTO RESPIMAT) 2.5-2.5 MCG/ACT AERS Inhale 2 puffs into the lungs daily. 09/03/17  Yes Tanda Rockers, MD  triamterene-hydrochlorothiazide (MAXZIDE-25) 37.5-25 MG tablet TAKE ONE TABLET BY MOUTH ONCE DAILY 02/02/17  Yes Tanda Rockers, MD  budesonide-formoterol [facility]) 160-4.5 MCG/ACT inhaler Inhale 2 puffs into the lungs 2 (two) times daily. Patient not taking: Reported on 09/07/2017 05/14/17   Magdalen Spatz, NP    Family History Family History  Problem Relation Age of Onset  . Hyperlipidemia Mother   . Asthma Mother   . Lupus Mother   . Breast cancer Mother 65  . Hypertension Father   . Hyperlipidemia Father   . Hypertension Sister   . Hypertension Brother   . Cancer Neg Hx   . Diabetes Neg Hx   . Coronary artery disease Neg Hx     Social History Social History  Substance Use Topics  . Smoking status: Former Smoker    Packs/day: 1.00    Years: 20.00    Quit date: 11/14/2003  . Smokeless tobacco: Never Used  . Alcohol use No     Allergies   Codeine   Review  of Systems Review of Systems  Constitutional: Negative for chills and fever.  HENT: Negative for congestion.   Eyes: Negative for visual disturbance.  Respiratory: Positive for shortness of breath.   Cardiovascular: Positive for chest pain.  Gastrointestinal: Negative for abdominal pain and vomiting.  Genitourinary: Negative for dysuria and flank pain.  Musculoskeletal: Negative for back pain, neck pain and neck stiffness.  Skin: Negative for rash.  Neurological: Negative for light-headedness and headaches.     Physical Exam Updated Vital Signs BP 117/64   Pulse 70   Temp 98.7 F (37.1 C) (Oral)   Resp 19   Ht 5\' 5"  (1.651 m)   Wt 94.1 kg (207 lb 7 oz)   SpO2 97%   BMI 34.52 kg/m   Physical Exam  Constitutional: She appears well-developed and well-nourished. No distress.  HENT:  Head: Normocephalic and atraumatic.  Eyes: Conjunctivae are normal.  Neck: Neck supple.  Cardiovascular: Normal rate and regular rhythm.   No murmur heard. Pulmonary/Chest: Effort normal and breath sounds  normal. No respiratory distress.  Abdominal: Soft. There is no tenderness.  Musculoskeletal: She exhibits no edema.  Neurological: She is alert.  Skin: Skin is warm and dry.  Psychiatric: She has a normal mood and affect.  Nursing note and vitals reviewed.    ED Treatments / Results  Labs (all labs ordered are listed, but only abnormal results are displayed) Labs Reviewed  BASIC METABOLIC PANEL - Abnormal; Notable for the following:       Result Value   Chloride 98 (*)    Glucose, Bld 105 (*)    All other components within normal limits  CBC - Abnormal; Notable for the following:    Hemoglobin 11.4 (*)    HCT 35.9 (*)    MCH 25.5 (*)    All other components within normal limits  I-STAT TROPONIN, ED  I-STAT TROPONIN, ED    EKG  EKG Interpretation None       Radiology Dg Chest 2 View  Result Date: 09/07/2017 CLINICAL DATA:  Chest pain. EXAM: CHEST  2 VIEW COMPARISON:  Chest x-ray dated Mar 16, 2017. FINDINGS: The cardiomediastinal silhouette is normal in size. Normal pulmonary vascularity. Unchanged bibasilar and right middle lobe scarring/atelectasis. Emphysematous changes. No focal consolidation, pleural effusion, or pneumothorax. No acute osseous abnormality. IMPRESSION: No active cardiopulmonary disease. Electronically Signed   By: Titus Dubin M.D.   On: 09/07/2017 15:55    Procedures Procedures (including critical care time)  Medications Ordered in ED Medications  nitroGLYCERIN (NITROSTAT) SL tablet 0.4 mg (not administered)  aspirin chewable tablet 324 mg (324 mg Oral Given 09/07/17 1959)  gi cocktail (Maalox,Lidocaine,Donnatal) (30 mLs Oral Given 09/07/17 2000)     Initial Impression / Assessment and Plan / ED Course  I have reviewed the triage vital signs and the nursing notes.  Pertinent labs & imaging results that were available during my care of the patient were reviewed by me and considered in my medical decision making (see chart for details).      Patient presents with intermittent chest pressure since last night different than typical.  Patient has multiple risk factors with heart score approximately 5.  Discussed with family medicine for observation.    The patients results and plan were reviewed and discussed.   Any x-rays performed were independently reviewed by myself.   Differential diagnosis were considered with the presenting HPI.  Medications  nitroGLYCERIN (NITROSTAT) SL tablet 0.4 mg (not administered)  aspirin chewable tablet 324  mg (324 mg Oral Given 09/07/17 1959)  gi cocktail (Maalox,Lidocaine,Donnatal) (30 mLs Oral Given 09/07/17 2000)    Vitals:   09/07/17 1742 09/07/17 1842 09/07/17 1845 09/07/17 1900  BP: 126/79 138/72 122/70 117/64  Pulse: 75 76 72 70  Resp: 18 17 16 19   Temp:      TempSrc:      SpO2: 96% 98% 98% 97%  Weight:      Height:        Final diagnoses:  Acute chest pain    Admission/ observation were discussed with the admitting physician, patient and/or family and they are comfortable with the plan.    Final Clinical Impressions(s) / ED Diagnoses   Final diagnoses:  Acute chest pain    New Prescriptions New Prescriptions   No medications on file     Elnora Morrison, MD 09/07/17 2034

## 2017-09-07 NOTE — ED Notes (Signed)
EKG did not transmit was already printed in triage and signed by the doctor

## 2017-09-07 NOTE — ED Notes (Signed)
Report attempted x 1

## 2017-09-07 NOTE — H&P (Signed)
[facility] Admission History and Physical Service Pager: [phone]  Patient name: [name]      Medical record number: [id number] Date of birth: [birth year] Age: 56 y.o. Gender: female  Primary Care Provider: Nicolette Bang, DO Consultants: None Code Status: Full  Chief Complaint: substernal chest pain/pressure and heart fluttering  Assessment and Plan: [name] is a 56 y.o. female with a past medical history significant for Sarcoidosis, COPD GOLD IV, hypertension, GERD, hyperlipidemia, breast cancer status post lumpectomy (2016) who presents today is complaining of substernal chest pain and heart fluttering concerning for ACS.  #Substernal chest pain/pressure, acute, stable Patient presents with substernal chest pain/pressure and heart palpitation/flutter in the past 24 hours. Pain occurs at rest and does not worsen with exertion. No similar presentation in the past. On presentation to the ED, troponins are negative 2. EKG shows normal sinus rhythm with no signs of ischemia. Vital signs were within normal limits. Except for nausea there were no other associated symptoms such as shortness of breath or abdominal pain. Patient denies any radiation to arm, shoulder or neck. Patient has a HEART score of 4 making her moderate risk mostly secondary to hyperlipidemia, hypertension, and age. ASCVD 10 year risk is 5.5%. Patient has a history of sarcoidosis without any cardiac involvement. Last echo showed an EF of 60-65%, normal wall motion and normal systolic function. Differential diagnosis would include MSK though the pain is not reproducible on exam, GERD, sarcoidosis with cardiac involvement, low suspicion for PE. Patient has a history of atypical chest discomfort in the setting of hypoxia. Given her risk factors and atypical chest pain symptoms on presentation slightly suspicious for  ACS syndrome. Will admit for further workup. --Admit to FMTS,  admitting physician Dr. Ardelia Mems --Admit to telemetry --Consult cards in the morning --Trend troponin --Follow up on morning EKG --Follow-up on a.m. CBC and BMP --Acetaminophen 650 mg q4 prn --Zofran 4 mg q6 prn --Aspirin 325 mg completed in the ED --Monitor vitals --resume aspirin 81 mg daily --nitroglycerin as needed  #Heart fluttering, acute, new onset Patient reports heart fluttering associated with substernal chest pain. The symptoms are new to the patient. Patient has no history of arrhythmias. Cardiac monitoring and EKG in the past have shown normal sinus rhythm. Last echo did not show any valvular disease concerning for possible arrhythmias. --Follow-up on a.m. EKG  --Follow up on Cardiology consult --follow-up on TSH  #COPD (GOLD stage IV), severe, stable Patient has severe COPD (GOLD stage IV) followed by Dr.Wert her pulmonologist. Patient is currently on 2L oxygen with ambulation and while sleeping. Patient is also undergoing pulmonary (recently started August 2018) and cardiac rehabilitation. Patient was recently seen in clinic and was started [employer].former smoker quit date 2005. --Start patient on Brovana 15 mcg  1 puff twice a day --Start patient on Incruse Ellipta 62.5 mcg 1 puff daily --albuterol when necessary  #Hypertension, controlled On admission BP 132/81.patient has taken all of her blood pressure medication this morning except for her night dose of metoprolol.she is currently normotensive and slightly bradycardic. Will hold night dose metoprolol. --Continue metoprolol tartrate 25 mg twice a day --Continue triamterene-hydrochlorothiazide 37.5 mg-25 mg --Continue to monitor vitals  # Anemia, mild Patient has a history of iron deficit anemia. On admission, hemoglobin was 11.4. Patient is currently not on iron supplementation.  --follow-up on anemia panel  #Hyperlipidemia Last lipid panel (02/05/2017) cholesterol 247,HDL 80, LDL 171 triglycerides 178.  Well controlled on home regimen.  ASCD10 year risk  5.5%. 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 ER 100%, PR positive HER-2 negative. Completed radiation therapy 2016. Currently in remission. --Continue Anastrozole 1 mg daily --continue Gabapentin 600 mg daily  #GERD Appears well controlled with current home regimen. --Prescribed Protonix 40 mg daily while inpatient --Patient is usually on omeprazole 20 mg daily  #Sarcoidosis with interstitial lung disease Likely contributing to worsening lung function the setting of COPD in former smoker (quit date 2005). Patient has a history of adrenal insufficiency. She hasn't has been on prednisone since 2007 taken completely off in 2012. Patient has resumed prednisone in 2017. --Continue prednisone 5 mg q48hrs  FEN/GI: heart healthy diet Prophylaxis: Lovenox  Disposition: Place under observation for ACS rule out.  History of Present Illness:  [name] is a 56 y.o. female with a past medical history significant for Sarcoidosis, COPD GOLD IV, hypertension, GERD, hyperlipidemia, breast cancer status post lumpectomy (2016) who presents today is complaining of substernal chest pain and heart fluttering. Patient reports that symptoms started yesterday evening, she describes substernal chest pressure "like someone was sitting in the middle of my chest". She also reports heart fluttering associated with chest pressure. Patient rates chest pain/pressure 5 out of 10. Patient did not take any medication at the time. Pain occurs at rest and is not worse with exertion. This morning patient reports minimal chest pressure with some associated nausea but no vomiting. Patient reports that chest pressure returned around 2 PM complaint heart fluttering and did not let up which led her to come to the ED. Patient denies any similar episode  in the past. Patient denies any radiation to arm, shoulder or neck. Patient's denies any shortness of breath, abdominal pain, headache, dizziness, vomiting, diarrhea.  In the ED, patient received aspirin 325 mg once. Patient was also given a GI cocktail for nausea.Chest x-ray was unremarkable. Troponins were negative 2. CBC and BMP were within normal limits. EKG wasn't performed in the ED prior to family medicine teaching service consulted for admission. However cardiac monitoring, there was no sign of ischemia or arrhythmias. Given acuity of symptoms, as well as comorbidities teaching service was consulted for admission for ACS rule out.  Review Of Systems: Per HPI with the following additions:  Review of Systems  Constitutional: Negative.   HENT: Negative.   Eyes: Negative.   Respiratory: Negative.   Cardiovascular: Positive for chest pain and palpitations.  Gastrointestinal: Positive for nausea. Negative for vomiting.  Genitourinary: Negative.   Musculoskeletal: Negative.   Skin: Negative.   Neurological: Negative.   Endo/Heme/Allergies: Negative.   Psychiatric/Behavioral: Negative.     Patient Active Problem List   Diagnosis Date Noted  . Knee pain 03/29/2017  . Right ankle pain 03/29/2017  . Obesity (BMI 30-39.9) 01/10/2017  . Dyspnea on exertion 11/09/2016  . Chronic respiratory failure with hypoxia and hypercapnia ([place]) 10/29/2016  . Malnutrition of moderate degree 10/10/2016  . Acute on chronic respiratory failure ([place]) 10/09/2016  . Difficulty in walking, not elsewhere classified   . Depression with anxiety 08/07/2016  . Subacromial impingement of left shoulder 07/19/2016  . Overweight (BMI 25.0-29.9) 05/11/2016  . Hot flashes 02/01/2016  . Constipation 01/13/2016  . Breast cancer of upper-outer quadrant of right female breast ([place]) 08/04/2015  . Tibial pain 06/03/2015  . Rotator cuff impingement syndrome 09/14/2014  . Menopausal syndrome (hot flashes) 01/19/2014  .  GERD (gastroesophageal reflux disease) 12/21/2012  . Hyperlipidemia 12/21/2012  . SICKLE-CELL TRAIT 04/15/2008  . Sarcoidosis ([place]) 01/10/2007  . ANEMIA, IRON DEFICIENCY, UNSPEC. 01/10/2007  . HYPERTENSION, BENIGN SYSTEMIC 01/10/2007  . 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  01/10/2007    Past Medical History: Past Medical History:  Diagnosis Date  . Allergic rhinitis   . Breast cancer ([place]) 2016   right breast  . COPD, mild ([place]) FOLLOWED BY DR Melvyn Novas  . GERD (gastroesophageal reflux disease)   . HTN (hypertension)   . Hypertension   . Iron deficiency anemia   . Long-term current use of steroids SYMBICORT INHALER  . 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 Personal history of radiation therapy 2016  . Sarcoidosis STABLE PER CXR JUNE 2013  . Shortness of breath   . Sickle cell trait [facility])     Past Surgical History: Past Surgical History:  Procedure Laterality Date  . ADJUSTABLE SUTURE MANIPULATION  05/22/2012   Procedure: ADJUSTABLE SUTURE MANIPULATION;  Surgeon: Dara Hoyer, MD;  Location: [facility];  Service: Ophthalmology;  Laterality: Right;  . BREAST LUMPECTOMY Right 2016  . CESAREAN SECTION  1986   W/ BILATERAL TUBAL LIGATION  . EYE SURGERY    . MEDIAN RECTUS REPAIR  05/22/2012   Procedure: MEDIAN RECTUS REPAIR;  Surgeon: Dara Hoyer, MD;  Location: [facility];  Service: Ophthalmology;  Laterality: Bilateral;  INFERIOR RECTUS RESECTION WITH ADJUSTIBLE SUTURES RIGHT EYE   . RADIOACTIVE SEED GUIDED MASTECTOMY WITH AXILLARY SENTINEL LYMPH NODE BIOPSY Right 08/18/2015   Procedure: RADIOACTIVE SEED GUIDED PARTIAL MASTECTOMY WITH AXILLARY SENTINEL LYMPH NODE BIOPSY;  Surgeon: Stark Klein, MD;  Location: [facility];  Service: General;  Laterality: Right;  . TOTAL ROBOTIC ASSISTED LAPAROSCOPIC HYSTERECTOMY  12-30-2010   SYMPTOMATIC UTERINE FIBROIDS  . UPPER TEETH EXTRACTION'S  1992    Social History: Social  History  Substance Use Topics  . Smoking status: Former Smoker    Packs/day: 1.00    Years: 20.00    Quit date: 11/14/2003  . Smokeless tobacco: Never Used  . Alcohol use No   Additional social history:  Please also refer to relevant sections of EMR.  Family History: Family History  Problem Relation Age of Onset  . Hyperlipidemia Mother   . Asthma Mother   . Lupus Mother   . Breast cancer Mother 69  . Hypertension Father   . Hyperlipidemia Father   . Hypertension Sister   . Hypertension Brother   . Cancer Neg Hx   . Diabetes Neg Hx   . Coronary artery disease Neg Hx    (If not completed, MUST add something in)  Allergies and Medications: Allergies  Allergen Reactions  . Codeine Other (See Comments)    Avoids-felt bad when took before   No current facility-administered medications on file prior to encounter.    Current Outpatient Prescriptions on File Prior to Encounter  Medication Sig Dispense Refill  . acetaminophen (TYLENOL) 500 MG tablet Take 1,000 mg by mouth every 6 (six) hours as needed for fever. Reported on 05/10/2016    . albuterol (VENTOLIN HFA) 108 (90 Base) MCG/ACT inhaler Inhale 2 puffs into the lungs every 6 (six) hours as needed for wheezing or shortness of breath. 1 Inhaler 2  . anastrozole (ARIMIDEX) 1 MG tablet Take 1 tablet (1 mg total) by mouth at bedtime. (Patient taking differently: Take 1 mg by mouth daily. ) 90 tablet 3  . atorvastatin (  LIPITOR) 20 MG tablet Take 1 tablet (20 mg total) by mouth daily. 90 tablet 3  . diphenhydrAMINE (BENADRYL) 25 MG tablet Take 1 tablet (25 mg total) by mouth every 6 (six) hours as needed. 30 tablet 0  . fluticasone (FLONASE) 50 MCG/ACT nasal spray Place 2 sprays into both nostrils daily as needed for allergies. 16 g 11  . gabapentin (NEURONTIN) 300 MG capsule Take 1 capsule (300 mg total) by mouth at bedtime. (Patient taking differently: Take 600 mg by mouth at bedtime. ) 90 capsule 3  . ibuprofen (ADVIL,MOTRIN) 600  MG tablet Take 1 tablet (600 mg total) by mouth every 8 (eight) hours as needed. 30 tablet 0  . metoprolol tartrate (LOPRESSOR) 25 MG tablet Take 1 tablet (25 mg total) by mouth 2 (two) times daily. 60 tablet 11  . Multiple Vitamin (MULTIVITAMIN) capsule Take 1 capsule by mouth daily.    Marland Kitchen olopatadine (PATANOL) 0.1 % ophthalmic solution   1  . omeprazole (PRILOSEC) 20 MG capsule TAKE ONE CAPSULE BY MOUTH  DAILY (Patient taking differently: TAKE 20 mgCAPSULE BY MOUTH  DAILY) 30 capsule 5  . OXYGEN 2lpm with rest and 3-4 lpm with exertion    . oxymetazoline (AFRIN NASAL SPRAY) 0.05 % nasal spray Place 1 spray into both nostrils 2 (two) times daily. (Patient taking differently: Place 1 spray into both nostrils daily as needed. ) 30 mL 0  . predniSONE (DELTASONE) 5 MG tablet Take 5 mg by mouth every other day.     . Tiotropium Bromide-Olodaterol (STIOLTO RESPIMAT) 2.5-2.5 MCG/ACT AERS Inhale 2 puffs into the lungs daily. 1 Inhaler 11  . triamterene-hydrochlorothiazide (MAXZIDE-25) 37.5-25 MG tablet TAKE ONE TABLET BY MOUTH ONCE DAILY 30 tablet 11  . budesonide-formoterol (SYMBICORT) 160-4.5 MCG/ACT inhaler Inhale 2 puffs into the lungs 2 (two) times daily. (Patient not taking: Reported on 09/07/2017) 1 Inhaler 2    Objective: BP 117/64   Pulse 70   Temp 98.7 F (37.1 C) (Oral)   Resp 19   Ht _0  (1.651 m)   Wt 207 lb 7 oz (94.1 kg)   SpO2 97%   BMI 34.52 kg/m   Physical Exam: General: NAD, pleasant, able to participate in exam Cardiac: RRR, normal heart sounds, no murmurs. 2+ radial and PT pulses bilaterally Respiratory: CTAB, mildly decreased air movement, normal effort, No wheezes, rales or rhonchi Abdomen: soft, nontender, nondistended, no hepatic or splenomegaly, +BS Extremities: no edema or cyanosis. WWP. Skin: warm and dry, no rashes noted Neuro: alert and oriented x4, no focal deficits, strength and sensation intact bilaterally upper and lower extremity Psych: Normal affect and  mood  Labs and Imaging: CBC BMET   Recent Labs Lab 09/07/17 1516  WBC 7.5  HGB 11.4*  HCT 35.9*  PLT 303    Recent Labs Lab 09/07/17 1516  NA 140  K 4.5  CL 98*  CO2 32  BUN 11  CREATININE 0.91  GLUCOSE 105*  CALCIUM 9.8     Trop: 0.01 (x2)  Dg Chest 2 View  Result Date: 09/07/2017 CLINICAL DATA:  Chest pain. EXAM: CHEST  2 VIEW COMPARISON:  Chest x-ray dated Mar 16, 2017. FINDINGS: The cardiomediastinal silhouette is normal in size. Normal pulmonary vascularity. Unchanged bibasilar and right middle lobe scarring/atelectasis. Emphysematous changes. No focal consolidation, pleural effusion, or pneumothorax. No acute osseous abnormality. IMPRESSION: No active cardiopulmonary disease. Electronically Signed   By: Titus Dubin M.D.   On: 09/07/2017 15:55    Marjie Skiff, MD 09/07/2017,  8:25 PM PGY-2, [place] Intern pager: [phone], text pages welcome

## 2017-09-08 ENCOUNTER — Other Ambulatory Visit: Payer: Self-pay

## 2017-09-08 DIAGNOSIS — F329 Major depressive disorder, single episode, unspecified: Secondary | ICD-10-CM | POA: Diagnosis not present

## 2017-09-08 DIAGNOSIS — R110 Nausea: Secondary | ICD-10-CM | POA: Diagnosis not present

## 2017-09-08 DIAGNOSIS — I10 Essential (primary) hypertension: Secondary | ICD-10-CM | POA: Diagnosis not present

## 2017-09-08 DIAGNOSIS — R079 Chest pain, unspecified: Secondary | ICD-10-CM | POA: Diagnosis not present

## 2017-09-08 DIAGNOSIS — J449 Chronic obstructive pulmonary disease, unspecified: Secondary | ICD-10-CM | POA: Diagnosis not present

## 2017-09-08 DIAGNOSIS — D573 Sickle-cell trait: Secondary | ICD-10-CM | POA: Diagnosis not present

## 2017-09-08 LAB — CBC
HCT: 33.1 % — ABNORMAL LOW (ref 36.0–46.0)
HCT: 33.8 % — ABNORMAL LOW (ref 36.0–46.0)
HEMOGLOBIN: 10.7 g/dL — AB (ref 12.0–15.0)
Hemoglobin: 10.5 g/dL — ABNORMAL LOW (ref 12.0–15.0)
MCH: 25.2 pg — ABNORMAL LOW (ref 26.0–34.0)
MCH: 25.4 pg — AB (ref 26.0–34.0)
MCHC: 31.7 g/dL (ref 30.0–36.0)
MCHC: 31.7 g/dL (ref 30.0–36.0)
MCV: 79.5 fL (ref 78.0–100.0)
MCV: 80.1 fL (ref 78.0–100.0)
PLATELETS: 291 10*3/uL (ref 150–400)
Platelets: 286 10*3/uL (ref 150–400)
RBC: 4.13 MIL/uL (ref 3.87–5.11)
RBC: 4.25 MIL/uL (ref 3.87–5.11)
RDW: 15.1 % (ref 11.5–15.5)
RDW: 15.2 % (ref 11.5–15.5)
WBC: 6.1 10*3/uL (ref 4.0–10.5)
WBC: 6.2 10*3/uL (ref 4.0–10.5)

## 2017-09-08 LAB — CREATININE, SERUM
CREATININE: 0.87 mg/dL (ref 0.44–1.00)
GFR calc Af Amer: >60 mL/min (ref >60)

## 2017-09-08 LAB — TSH: TSH: 0.802 u[IU]/mL (ref 0.350–4.500)

## 2017-09-08 LAB — IRON AND TIBC
IRON: 51 ug/dL (ref 28–170)
Saturation Ratios: 18 % (ref 10.4–31.8)
TIBC: 276 ug/dL (ref 250–450)
UIBC: 225 ug/dL

## 2017-09-08 LAB — BASIC METABOLIC PANEL
Anion gap: 11 (ref 5–15)
BUN: 13 mg/dL (ref 6–20)
CHLORIDE: 97 mmol/L — AB (ref 101–111)
CO2: 32 mmol/L (ref 22–32)
CREATININE: 0.81 mg/dL (ref 0.44–1.00)
Calcium: 9.6 mg/dL (ref 8.9–10.3)
GFR calc Af Amer: >60 mL/min (ref >60)
GFR calc non Af Amer: >60 mL/min (ref >60)
Glucose, Bld: 94 mg/dL (ref 65–99)
POTASSIUM: 3.5 mmol/L (ref 3.5–5.1)
Sodium: 140 mmol/L (ref 135–145)

## 2017-09-08 LAB — TROPONIN I
Troponin I: <0.03 ng/mL (ref <0.03)
Troponin I: <0.03 ng/mL (ref <0.03)

## 2017-09-08 LAB — RETICULOCYTES
RBC.: 4.13 MIL/uL (ref 3.87–5.11)
RETIC COUNT ABSOLUTE: 57.8 10*3/uL (ref 19.0–186.0)
Retic Ct Pct: 1.4 % (ref 0.4–3.1)

## 2017-09-08 LAB — FERRITIN: FERRITIN: 58 ng/mL (ref 11–307)

## 2017-09-08 LAB — VITAMIN B12: VITAMIN B 12: 550 pg/mL (ref 180–914)

## 2017-09-08 LAB — FOLATE: FOLATE: 23 ng/mL (ref >5.9)

## 2017-09-08 MED ORDER — METOPROLOL TARTRATE 25 MG PO TABS
25.0000 mg | ORAL_TABLET | Freq: Two times a day (BID) | ORAL | Status: DC
  Administered 2017-09-08 – 2017-09-09 (×3): 25 mg via ORAL
  Filled 2017-09-08 (×3): qty 1

## 2017-09-08 MED ORDER — SERTRALINE HCL 50 MG PO TABS
50.0000 mg | ORAL_TABLET | Freq: Every day | ORAL | Status: DC
  Administered 2017-09-08: 50 mg via ORAL
  Filled 2017-09-08 (×2): qty 1

## 2017-09-08 MED ORDER — PANTOPRAZOLE SODIUM 40 MG PO TBEC
40.0000 mg | DELAYED_RELEASE_TABLET | Freq: Every day | ORAL | Status: DC
  Administered 2017-09-08 – 2017-09-09 (×2): 40 mg via ORAL
  Filled 2017-09-08 (×2): qty 1

## 2017-09-08 MED ORDER — ATORVASTATIN CALCIUM 20 MG PO TABS
20.0000 mg | ORAL_TABLET | Freq: Every day | ORAL | Status: DC
  Administered 2017-09-08 (×2): 20 mg via ORAL
  Filled 2017-09-08 (×2): qty 1

## 2017-09-08 MED ORDER — ENOXAPARIN SODIUM 40 MG/0.4ML ~~LOC~~ SOLN
40.0000 mg | Freq: Every day | SUBCUTANEOUS | Status: DC
  Administered 2017-09-08 – 2017-09-09 (×2): 40 mg via SUBCUTANEOUS
  Filled 2017-09-08: qty 0.4

## 2017-09-08 MED ORDER — PREDNISONE 5 MG PO TABS
5.0000 mg | ORAL_TABLET | Freq: Every day | ORAL | Status: DC
  Filled 2017-09-08: qty 1

## 2017-09-08 MED ORDER — OXYMETAZOLINE HCL 0.05 % NA SOLN: 1.0000 | Freq: Every day | NASAL | Status: DC | PRN

## 2017-09-08 MED ORDER — PREDNISONE 5 MG PO TABS: 5.0000 mg | ORAL_TABLET | ORAL | Status: DC

## 2017-09-08 MED ORDER — ALBUTEROL SULFATE (2.5 MG/3ML) 0.083% IN NEBU: 2.5000 mg | INHALATION_SOLUTION | Freq: Four times a day (QID) | RESPIRATORY_TRACT | Status: DC | PRN

## 2017-09-08 MED ORDER — ACETAMINOPHEN 325 MG PO TABS: 650.0000 mg | ORAL_TABLET | ORAL | Status: DC | PRN

## 2017-09-08 MED ORDER — GABAPENTIN 300 MG PO CAPS
600.0000 mg | ORAL_CAPSULE | Freq: Every day | ORAL | Status: DC
  Administered 2017-09-08 (×2): 600 mg via ORAL
  Filled 2017-09-08 (×3): qty 2

## 2017-09-08 MED ORDER — ASPIRIN EC 81 MG PO TBEC
81.0000 mg | DELAYED_RELEASE_TABLET | Freq: Every day | ORAL | Status: DC
Start: 2017-09-08 — End: 2017-09-09
  Administered 2017-09-08 – 2017-09-09 (×2): 81 mg via ORAL
  Filled 2017-09-08 (×2): qty 1

## 2017-09-08 MED ORDER — ONDANSETRON HCL 4 MG/2ML IJ SOLN: 4.0000 mg | Freq: Four times a day (QID) | INTRAMUSCULAR | Status: DC | PRN

## 2017-09-08 MED ORDER — TRIAMTERENE-HCTZ 37.5-25 MG PO TABS
1.0000 | ORAL_TABLET | Freq: Every day | ORAL | Status: DC
  Administered 2017-09-08 – 2017-09-09 (×2): 1 via ORAL
  Filled 2017-09-08 (×2): qty 1

## 2017-09-08 MED ORDER — ANASTROZOLE 1 MG PO TABS
1.0000 mg | ORAL_TABLET | Freq: Every day | ORAL | Status: DC
  Administered 2017-09-08 – 2017-09-09 (×2): 1 mg via ORAL
  Filled 2017-09-08 (×2): qty 1

## 2017-09-08 MED ORDER — NITROGLYCERIN 0.4 MG SL SUBL
0.4000 mg | SUBLINGUAL_TABLET | SUBLINGUAL | Status: DC | PRN
Start: 2017-09-08 — End: 2017-09-09

## 2017-09-08 NOTE — Progress Notes (Signed)
Family Medicine Teaching Service Daily Progress Note Intern Pager: [phone]  Patient name: [name] Medical record number: [id number] Date of birth: May 23, 1961 Age: 56 y.o. Gender: female  Primary Care Provider: Nicolette Bang, DO Consultants: Cardiology Code Status: Full  Pt Overview and Major Events to Date:  10/26 admitted to FMTS  Assessment and Plan: [name] is a 56 y.o. female with a past medical history significant for Sarcoidosis, COPD GOLD IV, hypertension, GERD, hyperlipidemia, breast cancer status post lumpectomy (2016) who presents today is complaining of substernal chest pain and heart fluttering concerning for ACS.  #Substernal chest pain/pressure, acute, improved Initial presentation concerning for ACS given presenting symptoms and HEART score of 4 and risk factors. Overnight, pain has resolved with only ASA 325 mg given in the ED. Atypical chest pain, likely will need stress test and possible repeat ECHO. Morning EK is not concerning and troponin have been negative (<0.03). Will follow up on Cardiology consult and recommendations.  --Admit to telemetry --Consult cards in the morning --Trend troponin --Acetaminophen 650 mg q4 prn --Zofran 4 mg q6 prn  #Heart fluttering, acute, new onset Patient reports heart fluttering associated with substernal chest pain. The symptoms are new to the patient. Patient has no history of arrhythmias. Cardiac monitoring and EKG in the past have shown normal sinus rhythm. Last echo did not show any valvular disease concerning for possible arrhythmias. Morning EKG is not concerning for any arrhythmias. TSH ios within normal limits. --Follow-up on a.m. EKG  --Follow up on Cardiology consult --follow-up on TSH  #COPD (GOLD stage IV), severe, stable Patient has severe COPD (GOLD stage IV) followed by Dr.Wert her pulmonologist. Patient is currently on 2L oxygen with ambulation and while sleeping. Patient is also undergoing  pulmonary (recently started August 2018) and cardiac rehabilitation. Patient was recently seen in clinic and was started [employer].former smoker quit date 2005. --Start patient on Brovana 15 mcg 1 puff twice a day --Start patient on Incruse Ellipta 62.5 mcg 1 puff daily --albuterol when necessary  #Hypertension, controlled On admission BP 132/81.patient has taken all of her blood pressure medication this morning except for her night dose of metoprolol.she is currently normotensive and slightly bradycardic. Will hold night dose metoprolol. --Continue metoprolol tartrate 25 mg twice a day --Continue triamterene-hydrochlorothiazide 37.5 mg-25 mg --Continue to monitor vitals  # Anemia, mild Patient has a history of iron deficit anemia. On admission, hemoglobin was 11.4. This morning Hbg 10.5.Patient is currently not on iron supplementation.  --Follow-up on anemia panel  #Hyperlipidemia Last lipid panel (02/05/2017) cholesterol 247,HDL 80, LDL 171 triglycerides 178. Well controlled on home regimen. ASCD10 year risk  5.5%. 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 ER 100%, PR positive HER-2 negative. Completed radiation therapy 2016. Currently in remission. --Continue Anastrozole 1 mg daily --continue Gabapentin 600 mg daily  #GERD Appears well controlled with current home regimen. --Prescribed Protonix 40 mg daily while inpatient --Patient is usually on omeprazole 20 mg daily  #Sarcoidosis with interstitial lung disease Likely contributing to worsening lung function the setting of COPD in former smoker (quit date 2005). Patient has a history of adrenal insufficiency. She hasn't has been on prednisone since 2007 taken completely off in 2012. Patient was restarted in 2017. --Continue prednisone 5 mg q48hrs   FEN/GI: Heart healthy Diet PPx:  Lovenox  Disposition: Likely home following cardiac workup  Subjective:  Patient is  feeling better this morning. Patient denies any chest pain, heart palpitations or nausea.  Objective: Temp:  [97.4 F (36.3 C)-98.7 F (37.1 C)] 97.4 F (36.3 C) (10/27 0452) Pulse Rate:  [56-85] 62 (10/27 0525) Resp:  [10-19] 17 (10/27 0452) BP: (113-138)/(58-84) 119/72 (10/27 0452) SpO2:  [94 %-100 %] 100 % (10/27 0525) Weight:  [204 lb 14.4 oz (92.9 kg)-207 lb 7 oz (94.1 kg)] 204 lb 14.4 oz (92.9 kg) (10/26 2356) Physical Exam: General: NAD, pleasant, able to participate in exam Cardiac: RRR, normal heart sounds, no murmurs. 2+ radial and PT pulses bilaterally Respiratory: CTAB, normal effort, No wheezes, rales or rhonchi Abdomen: soft, nontender, nondistended, no hepatic or splenomegaly, +BS Extremities: no edema or cyanosis. WWP. Skin: warm and dry, no rashes noted Neuro: alert and oriented x4, no focal deficits, strength and sensation intact bilaterally upper and lower extremity Psych: Normal affect and mood  Laboratory:  Recent Labs Lab 09/07/17 1516 09/08/17 0019  WBC 7.5 6.2  HGB 11.4* 10.7*  HCT 35.9* 33.8*  PLT 303 286    Recent Labs Lab 09/07/17 1516 09/08/17 0019  NA 140  --   K 4.5  --   CL 98*  --   CO2 32  --   BUN 11  --   CREATININE 0.91 0.87  CALCIUM 9.8  --   GLUCOSE 105*  --    Trop:<0.03  Imaging/Diagnostic Tests: Dg Chest 2 View  Result Date: 09/07/2017 CLINICAL DATA:  Chest pain. EXAM: CHEST  2 VIEW COMPARISON:  Chest x-ray dated Mar 16, 2017. FINDINGS: The cardiomediastinal silhouette is normal in size. Normal pulmonary vascularity. Unchanged bibasilar and right middle lobe scarring/atelectasis. Emphysematous changes. No focal consolidation, pleural effusion, or pneumothorax. No acute osseous abnormality. IMPRESSION: No active cardiopulmonary disease. Electronically Signed   By: Titus Dubin M.D.   On: 09/07/2017 15:55    Marjie Skiff,  MD 09/08/2017, 6:41 AM PGY-1, [place] Intern pager: [phone], text pages welcome

## 2017-09-09 ENCOUNTER — Other Ambulatory Visit: Payer: Self-pay

## 2017-09-09 DIAGNOSIS — D573 Sickle-cell trait: Secondary | ICD-10-CM | POA: Diagnosis not present

## 2017-09-09 DIAGNOSIS — F329 Major depressive disorder, single episode, unspecified: Secondary | ICD-10-CM | POA: Diagnosis not present

## 2017-09-09 DIAGNOSIS — R110 Nausea: Secondary | ICD-10-CM | POA: Diagnosis not present

## 2017-09-09 DIAGNOSIS — R0789 Other chest pain: Secondary | ICD-10-CM | POA: Diagnosis not present

## 2017-09-09 DIAGNOSIS — J449 Chronic obstructive pulmonary disease, unspecified: Secondary | ICD-10-CM | POA: Diagnosis not present

## 2017-09-09 DIAGNOSIS — I10 Essential (primary) hypertension: Secondary | ICD-10-CM | POA: Diagnosis not present

## 2017-09-09 DIAGNOSIS — E782 Mixed hyperlipidemia: Secondary | ICD-10-CM | POA: Diagnosis not present

## 2017-09-09 DIAGNOSIS — R079 Chest pain, unspecified: Secondary | ICD-10-CM | POA: Diagnosis not present

## 2017-09-09 MED ORDER — SERTRALINE HCL 50 MG PO TABS: 50.0000 mg | ORAL_TABLET | Freq: Every day | ORAL | 0 refills | Status: DC

## 2017-09-09 NOTE — Progress Notes (Signed)
Family Medicine Teaching Service Daily Progress Note Intern Pager: [phone]  Patient name: [name] Medical record number: [id number] Date of birth: [birth year] Age: 56 y.o. Gender: female  Primary Care Provider: Nicolette Bang, DO Consultants: Cardiology Code Status: Full  Pt Overview and Major Events to Date:  10/26 admitted to FMTS  Assessment and Plan: [name] is a 56 y.o. female with a past medical history significant for Sarcoidosis, COPD GOLD IV, hypertension, GERD, hyperlipidemia, breast cancer status post lumpectomy (2016) who presents today is complaining of substernal chest pain and heart fluttering concerning for ACS.  #Substernal chest pain/pressure, acute, improved Initial presentation concerning for ACS given presenting symptoms and HEART score of 4 and risk factors. Pain has resolved with only ASA 325 mg given in the ED. Atypical chest pain, likely will need stress test and possible repeat ECHO. Will follow up on Cardiology consult and recommendations.  --cards recs pending --Acetaminophen 650 mg q4 prn --Zofran 4 mg q6 prn  #COPD (GOLD stage IV), severe, stable Patient has severe COPD (GOLD stage IV) followed by Dr.Wert her pulmonologist. Patient is currently on 2L oxygen with ambulation and while sleeping. Patient is also undergoing pulmonary (recently started August 2018) and cardiac rehabilitation. Patient was recently seen in clinic and was started [employer]. Former smoker quit date 2005. --Start patient on Brovana 15 mcg 1 puff twice a day --Start patient on Incruse Ellipta 62.5 mcg 1 puff daily --albuterol when necessary  #Hypertension, controlled --Continue metoprolol tartrate 25 mg twice a day --Continue triamterene-hydrochlorothiazide 37.5 mg-25 mg  # Anemia, mild Patient has a history of iron deficit anemia. On admission, hemoglobin was 11.4. Patient is currently not on iron supplementation. Anemia panel equivocal for iron  deficiency vs ACD.  --monitor on CBC as PCP feels appropriate  #Hyperlipidemia Last lipid panel (02/05/2017) cholesterol 247,HDL 80, LDL 171 triglycerides 178. Well controlled on home regimen. ASCD10 year risk  5.5%. 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 ER 100%, PR positive HER-2 negative. Completed radiation therapy 2016. Currently in remission. --Continue Anastrozole 1 mg daily --continue Gabapentin 600 mg daily  #GERD Appears well controlled with current home regimen. --Prescribed Protonix 40 mg daily while inpatient --Patient is usually on omeprazole 20 mg daily  #Sarcoidosis with interstitial lung disease Likely contributing to worsening lung function the setting of COPD in former smoker (quit date 2005). Patient has a history of adrenal insufficiency. She hasn't has been on prednisone since 2007 taken completely off in 2012. Patient was restarted in 2017. --Continue prednisone 5 mg q48hrs  FEN/GI: Heart healthy Diet PPx: Lovenox  Disposition: Likely home following cardiac workup  Subjective:  Patient feels well this morning, eager to eat. Would like to go home.  Objective: Temp:  [98.5 F (36.9 C)-98.9 F (37.2 C)] 98.5 F (36.9 C) (10/28 0515) Pulse Rate:  [70-97] 70 (10/28 1019) Resp:  [16-18] 18 (10/28 1019) BP: (102-117)/(57-68) 117/62 (10/28 0515) SpO2:  [93 %-100 %] 93 % (10/28 1027) Weight:  [210 lb (95.3 kg)] 210 lb (95.3 kg) (10/28 0515) Physical Exam: General: NAD, pleasant, able to participate in exam Cardiac: RRR, normal heart sounds, no murmurs. 2+ radial and PT pulses bilaterally Respiratory: CTAB, normal effort, No wheezes, rales or rhonchi Abdomen: soft, nontender, nondistended, no hepatic or splenomegaly, +BS Extremities: no edema or cyanosis. WWP. Skin: warm and dry, no rashes noted Neuro: alert and oriented  x4, no focal deficits, strength and sensation intact bilaterally upper and lower extremity Psych: Normal affect and mood  Laboratory:  Recent Labs Lab 09/07/17 1516 09/08/17 0019 09/08/17 0649  WBC 7.5 6.2 6.1  HGB 11.4* 10.7* 10.5*  HCT 35.9* 33.8* 33.1*  PLT 303 286 291    Recent Labs Lab 09/07/17 1516 09/08/17 0019 09/08/17 0649  NA 140  --  140  K 4.5  --  3.5  CL 98*  --  97*  CO2 32  --  32  BUN 11  --  13  CREATININE 0.91 0.87 0.81  CALCIUM 9.8  --  9.6  GLUCOSE 105*  --  94   Trop:<0.03  Imaging/Diagnostic Tests: No results found.  Sela Hilding, MD 09/09/2017, 12:15 PM PGY-2, [place] Intern pager: [phone], text pages welcome

## 2017-09-09 NOTE — Plan of Care (Signed)
Problem: Pain Managment: Goal: General experience of comfort will improve Outcome: Completed/Met Date Met: 09/09/17 Patient denies pain and is ab;e to relay if she needs something or if something is wrong but has not c/o pain or discomfort all night.

## 2017-09-09 NOTE — Discharge Summary (Signed)
[facility] Discharge Summary  Patient name: [name] Medical record number: [id number] Date of birth: February 25, 1961 Age: 56 y.o. Gender: female Date of Admission: 09/07/2017  Date of Discharge: 09/09/17  Admitting Physician: Leeanne Rio, MD  Primary Care Provider: Nicolette Bang, DO Consultants: cardiology  Indication for Hospitalization: chest pain  Discharge Diagnoses/Problem List:  COPD GOLD stage IV HTN Anemia HLD Breast cancer s/p resection Sarcoidosis GERD  Disposition: home  Discharge Condition: improved  Discharge Exam: see progress note from day of discharge  Brief Hospital Course:  Patient presented with substernal acute chest pressure and subjective heart fluttering x 1 day. Atypical symptoms - occurred at rest and did not change with exertion. No hx of similar, non radiating. In the ED, patient received aspirin 325 mg once. Patient was also given a GI cocktail for nausea.Chest x-ray was unremarkable. Troponins were negative 2. CBC and BMP were within normal limits. EKG without signs of ischemia. Cardiology was consulted, recommended continued medical optimization and outpatient stress test. Mild anemia noted, anemia panel with borderline low ferritin and iron. Hx of iron deficiency anemia, not on supplementation. Given "heart fluttering," TSH was checked and normal. Patient mentioned depression and anxiety combined with stress of medical burden, desired starting SSRI. Zoloft was started as no hx of mania.   Issues for Follow Up:  1. ACS - cardiology (Dr. Einar Gip) recommended outpatient stress testing. Patient amenable to this.  2. Remainder of medications were continued from home. Patient recently changed to Stiolto per pulm, would ensure she is using this correctly.  3. Zoloft was started for depression, given 90 day supply.   Significant Procedures: none  Significant Labs and Imaging:   Recent Labs Lab  09/07/17 1516 09/08/17 0019 09/08/17 0649  WBC 7.5 6.2 6.1  HGB 11.4* 10.7* 10.5*  HCT 35.9* 33.8* 33.1*  PLT 303 286 291    Recent Labs Lab 09/07/17 1516 09/08/17 0019 09/08/17 0649  NA 140  --  140  K 4.5  --  3.5  CL 98*  --  97*  CO2 32  --  32  GLUCOSE 105*  --  94  BUN 11  --  13  CREATININE 0.91 0.87 0.81  CALCIUM 9.8  --  9.6   Trop neg x 3.   Results/Tests Pending at Time of Discharge: none  Discharge Medications:  Allergies as of 09/09/2017      Reactions   Codeine Other (See Comments)   Avoids-felt bad when took before      Medication List    STOP taking these medications   budesonide-formoterol 160-4.5 MCG/ACT inhaler Commonly known as:  SYMBICORT   diphenhydrAMINE 25 MG tablet Commonly known as:  BENADRYL   ibuprofen 600 MG tablet Commonly known as:  ADVIL,MOTRIN     TAKE these medications   acetaminophen 500 MG tablet Commonly known as:  TYLENOL Take 1,000 mg by mouth every 6 (six) hours as needed for fever. Reported on 05/10/2016   albuterol 108 (90 Base) MCG/ACT inhaler Commonly known as:  VENTOLIN HFA Inhale 2 puffs into the lungs every 6 (six) hours as needed for wheezing or shortness of breath.   anastrozole 1 MG tablet Commonly known as:  ARIMIDEX Take 1 tablet (1 mg total) by mouth at bedtime. What changed:  when to take this   aspirin EC 81 MG tablet Take 81 mg by mouth daily.   atorvastatin 20 MG tablet Commonly known as:  LIPITOR Take 1 tablet (  20 mg total) by mouth daily.   fluticasone 50 MCG/ACT nasal spray Commonly known as:  FLONASE Place 2 sprays into both nostrils daily as needed for allergies.   gabapentin 300 MG capsule Commonly known as:  NEURONTIN Take 1 capsule (300 mg total) by mouth at bedtime. What changed:  how much to take   metoprolol tartrate 25 MG tablet Commonly known as:  LOPRESSOR Take 1 tablet (25 mg total) by mouth 2 (two) times daily.   multivitamin capsule Take 1 capsule by mouth  daily.   olopatadine 0.1 % ophthalmic solution Commonly known as:  PATANOL   omeprazole 20 MG capsule Commonly known as:  PRILOSEC TAKE ONE CAPSULE BY MOUTH  DAILY What changed:  See the new instructions.   OXYGEN 2lpm with rest and 3-4 lpm with exertion   oxymetazoline 0.05 % nasal spray Commonly known as:  AFRIN NASAL SPRAY Place 1 spray into both nostrils 2 (two) times daily. What changed:  when to take this  reasons to take this   predniSONE 5 MG tablet Commonly known as:  DELTASONE Take 5 mg by mouth every other day.   sertraline 50 MG tablet Commonly known as:  ZOLOFT Take 1 tablet (50 mg total) by mouth daily.   Tiotropium Bromide-Olodaterol 2.5-2.5 MCG/ACT Aers Commonly known as:  STIOLTO RESPIMAT Inhale 2 puffs into the lungs daily.   triamterene-hydrochlorothiazide 37.5-25 MG tablet Commonly known as:  MAXZIDE-25 TAKE ONE TABLET BY MOUTH ONCE DAILY       Discharge Instructions: Please refer to Patient Instructions section of EMR for full details.  Patient was counseled important signs and symptoms that should prompt return to medical care, changes in medications, dietary instructions, activity restrictions, and follow up appointments.   Follow-Up Appointments: Follow-up Information    Nicolette Bang, DO. Go on 09/12/2017.   Specialty:  Family Medicine Why:  3:40pm for hospital follow up with Dr. Valentino Hue [facility] Contact information: [place] [address] [phone]           Sela Hilding, MD 09/09/2017, 1:37 PM PGY-2, [place]

## 2017-09-09 NOTE — Consult Note (Signed)
Reason for Consult: Chest pain Referring Physician: Marjie Skiff, MD  [name] is an 56 y.o. female.  HPI: Patient with history of sarcoidosis of the lung, told to be stable as per pulmonary, would been doing well until 2 days ago had developed chest tightness in the middle of the chest, no radiation, no exertional complaints, lasted for several hours.  Because of on and off chest discomfort, she decided to come to the emergency room as per family request.  Since yesterday she has not had any further chest pain.  States that she feels well.  She has chronic shortness of breath and dyspnea on exertion, uses continuous oxygen at 2 L/min and states that dyspnea has remained stable.  She denies any leg edema, denies painful swelling of the lower extremities, no PND or orthopnea.  No hemoptysis.  Past medical history significant for hyperlipidemia, hypertension, hyperglycemia, mild COPD along with sarcoidosis of the lung and prior history of tobacco use disorder.  She has had right lumpectomy followed by radiation therapy to her chest in 2011.  Past Medical History:  Diagnosis Date  . Allergic rhinitis   . Breast cancer ([place]) 2016   right breast  . COPD, mild ([place]) FOLLOWED BY DR Melvyn Novas  . GERD (gastroesophageal reflux disease)   . HTN (hypertension)   . Hypertension   . Iron deficiency anemia   . Long-term current use of steroids SYMBICORT INHALER  . 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 Personal history of radiation therapy 2016  . Sarcoidosis STABLE PER CXR JUNE 2013  . Shortness of breath   . Sickle cell trait [facility])     Past Surgical History:  Procedure Laterality Date  . ADJUSTABLE SUTURE MANIPULATION  05/22/2012   Procedure: ADJUSTABLE SUTURE MANIPULATION;  Surgeon: Dara Hoyer, MD;  Location: [facility];  Service: Ophthalmology;  Laterality: Right;  . BREAST LUMPECTOMY Right 2016  . CESAREAN SECTION  1986   W/ BILATERAL TUBAL LIGATION  . EYE SURGERY    . MEDIAN RECTUS  REPAIR  05/22/2012   Procedure: MEDIAN RECTUS REPAIR;  Surgeon: Dara Hoyer, MD;  Location: [facility];  Service: Ophthalmology;  Laterality: Bilateral;  INFERIOR RECTUS RESECTION WITH ADJUSTIBLE SUTURES RIGHT EYE   . RADIOACTIVE SEED GUIDED MASTECTOMY WITH AXILLARY SENTINEL LYMPH NODE BIOPSY Right 08/18/2015   Procedure: RADIOACTIVE SEED GUIDED PARTIAL MASTECTOMY WITH AXILLARY SENTINEL LYMPH NODE BIOPSY;  Surgeon: Stark Klein, MD;  Location: [facility];  Service: General;  Laterality: Right;  . TOTAL ROBOTIC ASSISTED LAPAROSCOPIC HYSTERECTOMY  12-30-2010   SYMPTOMATIC UTERINE FIBROIDS  . UPPER TEETH EXTRACTION'S  1992    Family History  Problem Relation Age of Onset  . Hyperlipidemia Mother   . Asthma Mother   . Lupus Mother   . Breast cancer Mother 50  . Hypertension Father   . Hyperlipidemia Father   . Hypertension Sister   . Hypertension Brother   . Cancer Neg Hx   . Diabetes Neg Hx   . Coronary artery disease Neg Hx     Social History:  reports that she quit smoking about 13 years ago. She has a 20.00 pack-year smoking history. She has never used smokeless tobacco. She reports that she does not drink alcohol or use drugs.  Allergies:  Allergies  Allergen Reactions  . Codeine Other (See Comments)    Avoids-felt bad when took before    Medications: I have reviewed the patient's current medications.  Results for orders  placed or performed during the hospital encounter of 09/07/17 (from the past 48 hour(s))  Basic metabolic panel     Status: Abnormal   Collection Time: 09/07/17  3:16 PM  Result Value Ref Range   Sodium 140 135 - 145 mmol/L   Potassium 4.5 3.5 - 5.1 mmol/L   Chloride 98 (L) 101 - 111 mmol/L   CO2 32 22 - 32 mmol/L   Glucose, Bld 105 (H) 65 - 99 mg/dL   BUN 11 6 - 20 mg/dL   Creatinine, Ser 0.91 0.44 - 1.00 mg/dL   Calcium 9.8 8.9 - 10.3 mg/dL   GFR calc non Af Amer >60 >60 mL/min   GFR calc Af Amer >60 >60 mL/min     Comment: (NOTE) The eGFR has been calculated using the CKD EPI equation. This calculation has not been validated in all clinical situations. eGFR's persistently <60 mL/min signify possible Chronic Kidney Disease.    Anion gap 10 5 - 15  CBC     Status: Abnormal   Collection Time: 09/07/17  3:16 PM  Result Value Ref Range   WBC 7.5 4.0 - 10.5 K/uL   RBC 4.47 3.87 - 5.11 MIL/uL   Hemoglobin 11.4 (L) 12.0 - 15.0 g/dL   HCT 35.9 (L) 36.0 - 46.0 %   MCV 80.3 78.0 - 100.0 fL   MCH 25.5 (L) 26.0 - 34.0 pg   MCHC 31.8 30.0 - 36.0 g/dL   RDW 14.7 11.5 - 15.5 %   Platelets 303 150 - 400 K/uL  I-stat troponin, ED     Status: None   Collection Time: 09/07/17  3:22 PM  Result Value Ref Range   Troponin i, poc 0.00 0.00 - 0.08 ng/mL   Comment 3            Comment: Due to the release kinetics of cTnI, a negative result within the first hours of the onset of symptoms does not rule out myocardial infarction with certainty. If myocardial infarction is still suspected, repeat the test at appropriate intervals.   I-stat troponin, ED     Status: None   Collection Time: 09/07/17  8:10 PM  Result Value Ref Range   Troponin i, poc 0.00 0.00 - 0.08 ng/mL   Comment 3            Comment: Due to the release kinetics of cTnI, a negative result within the first hours of the onset of symptoms does not rule out myocardial infarction with certainty. If myocardial infarction is still suspected, repeat the test at appropriate intervals.   Folate     Status: None   Collection Time: 09/08/17 12:19 AM  Result Value Ref Range   Folate 23.0 >5.9 ng/mL  CBC     Status: Abnormal   Collection Time: 09/08/17 12:19 AM  Result Value Ref Range   WBC 6.2 4.0 - 10.5 K/uL   RBC 4.25 3.87 - 5.11 MIL/uL   Hemoglobin 10.7 (L) 12.0 - 15.0 g/dL   HCT 33.8 (L) 36.0 - 46.0 %   MCV 79.5 78.0 - 100.0 fL   MCH 25.2 (L) 26.0 - 34.0 pg   MCHC 31.7 30.0 - 36.0 g/dL   RDW 15.2 11.5 - 15.5 %   Platelets 286 150 - 400 K/uL   Creatinine, serum     Status: None   Collection Time: 09/08/17 12:19 AM  Result Value Ref Range   Creatinine, Ser 0.87 0.44 - 1.00 mg/dL   GFR calc non Af  Amer >60 >60 mL/min   GFR calc Af Amer >60 >60 mL/min    Comment: (NOTE) The eGFR has been calculated using the CKD EPI equation. This calculation has not been validated in all clinical situations. eGFR's persistently <60 mL/min signify possible Chronic Kidney Disease.   TSH     Status: None   Collection Time: 09/08/17 12:19 AM  Result Value Ref Range   TSH 0.802 0.350 - 4.500 uIU/mL    Comment: Performed by a 3rd Generation assay with a functional sensitivity of <=0.01 uIU/mL.  Troponin I     Status: None   Collection Time: 09/08/17 12:19 AM  Result Value Ref Range   Troponin I <0.03 <0.03 ng/mL  Vitamin B12     Status: None   Collection Time: 09/08/17  6:49 AM  Result Value Ref Range   Vitamin B-12 550 180 - 914 pg/mL    Comment: (NOTE) This assay is not validated for testing neonatal or myeloproliferative syndrome specimens for Vitamin B12 levels.   Iron and TIBC     Status: None   Collection Time: 09/08/17  6:49 AM  Result Value Ref Range   Iron 51 28 - 170 ug/dL   TIBC 276 250 - 450 ug/dL   Saturation Ratios 18 10.4 - 31.8 %   UIBC 225 ug/dL  Ferritin     Status: None   Collection Time: 09/08/17  6:49 AM  Result Value Ref Range   Ferritin 58 11 - 307 ng/mL  Reticulocytes     Status: None   Collection Time: 09/08/17  6:49 AM  Result Value Ref Range   Retic Ct Pct 1.4 0.4 - 3.1 %   RBC. 4.13 3.87 - 5.11 MIL/uL   Retic Count, Absolute 57.8 19.0 - 186.0 K/uL  Troponin I     Status: None   Collection Time: 09/08/17  6:49 AM  Result Value Ref Range   Troponin I <0.03 <0.03 ng/mL  Basic metabolic panel     Status: Abnormal   Collection Time: 09/08/17  6:49 AM  Result Value Ref Range   Sodium 140 135 - 145 mmol/L   Potassium 3.5 3.5 - 5.1 mmol/L    Comment: DELTA CHECK NOTED   Chloride 97 (L) 101 - 111 mmol/L    CO2 32 22 - 32 mmol/L   Glucose, Bld 94 65 - 99 mg/dL   BUN 13 6 - 20 mg/dL   Creatinine, Ser 0.81 0.44 - 1.00 mg/dL   Calcium 9.6 8.9 - 10.3 mg/dL   GFR calc non Af Amer >60 >60 mL/min   GFR calc Af Amer >60 >60 mL/min    Comment: (NOTE) The eGFR has been calculated using the CKD EPI equation. This calculation has not been validated in all clinical situations. eGFR's persistently <60 mL/min signify possible Chronic Kidney Disease.    Anion gap 11 5 - 15  CBC     Status: Abnormal   Collection Time: 09/08/17  6:49 AM  Result Value Ref Range   WBC 6.1 4.0 - 10.5 K/uL   RBC 4.13 3.87 - 5.11 MIL/uL   Hemoglobin 10.5 (L) 12.0 - 15.0 g/dL   HCT 33.1 (L) 36.0 - 46.0 %   MCV 80.1 78.0 - 100.0 fL   MCH 25.4 (L) 26.0 - 34.0 pg   MCHC 31.7 30.0 - 36.0 g/dL   RDW 15.1 11.5 - 15.5 %   Platelets 291 150 - 400 K/uL  Troponin I     Status: None  Collection Time: 09/08/17 11:17 AM  Result Value Ref Range   Troponin I <0.03 <0.03 ng/mL    Lipid Panel     Component Value Date/Time   CHOL 247 (H) 02/05/2017 1528   TRIG 178 (H) 02/05/2017 1528   HDL 80 02/05/2017 1528   CHOLHDL 3.1 02/05/2017 1528   CHOLHDL 4.1 02/04/2015 1619   VLDL 17 02/04/2015 1619   LDLCALC 131 (H) 02/05/2017 1528     Dg Chest 2 View  Result Date: 09/07/2017 CLINICAL DATA:  Chest pain. EXAM: CHEST  2 VIEW COMPARISON:  Chest x-ray dated Mar 16, 2017. FINDINGS: The cardiomediastinal silhouette is normal in size. Normal pulmonary vascularity. Unchanged bibasilar and right middle lobe scarring/atelectasis. Emphysematous changes. No focal consolidation, pleural effusion, or pneumothorax. No acute osseous abnormality. IMPRESSION: No active cardiopulmonary disease. Electronically Signed   By: Titus Dubin M.D.   On: 09/07/2017 15:55    Review of Systems  Constitutional: Negative.   HENT: Negative.   Eyes: Negative.   Respiratory: Positive for shortness of breath. Negative for cough, hemoptysis and wheezing.    Cardiovascular: Positive for chest pain and palpitations. Negative for orthopnea, claudication, leg swelling and PND.  Gastrointestinal: Positive for heartburn. Negative for abdominal pain, diarrhea, nausea and vomiting.  Genitourinary: Negative.   Musculoskeletal: Negative.   Neurological: Negative.   Endo/Heme/Allergies: Negative.   Psychiatric/Behavioral: Negative.    Blood pressure 117/62, pulse 70, temperature 98.5 F (36.9 C), temperature source Oral, resp. rate 18, height 5' 5"  (1.651 m), weight 95.3 kg (210 lb), SpO2 93 %.   Physical Exam  Constitutional: She is oriented to person, place, and time. She appears well-developed and well-nourished.  Mildly obese  HENT:  Head: Atraumatic.  Eyes: Conjunctivae are normal.  Neck: Normal range of motion. Neck supple.  Cardiovascular: Normal rate, regular rhythm, normal heart sounds and intact distal pulses.  Exam reveals no gallop and no friction rub.   No murmur heard. Respiratory: Effort normal. No respiratory distress. She has rales (bilateral basal coarse crackles). She exhibits no tenderness.  GI: Soft. Bowel sounds are normal.  Musculoskeletal: Normal range of motion.  Neurological: She is alert and oriented to person, place, and time. She has normal reflexes.  Skin: Skin is warm and dry.  Psychiatric: She has a normal mood and affect.   Cardiac studies:  EKG 09/08/2017: Normal sinus rhythm.  No evidence of ischemia.  Normal EKG.  Echocardiogram 10/09/2016: Normal LV systolic function, EF 51-76%.  No significant valvular abnormalities.  Scheduled Meds: . anastrozole  1 mg Oral Daily  . arformoterol  15 mcg Nebulization BID   And  . umeclidinium bromide  1 puff Inhalation Daily  . aspirin EC  81 mg Oral Daily  . atorvastatin  20 mg Oral Daily  . enoxaparin (LOVENOX) injection  40 mg Subcutaneous Daily  . gabapentin  600 mg Oral QHS  . metoprolol tartrate  25 mg Oral BID  . pantoprazole  40 mg Oral Daily  . predniSONE   5 mg Oral QODAY  . sertraline  50 mg Oral Daily  . triamterene-hydrochlorothiazide  1 tablet Oral Daily   Continuous Infusions: PRN Meds:.acetaminophen, albuterol, nitroGLYCERIN, ondansetron (ZOFRAN) IV, oxymetazoline   Assessment/Plan: 1.  Atypical chest pain, cannot exclude angina pectoris. 2.  Hypertension 3.  Mixed hyperlipidemia 4.  Hyperglycemia 5.  History of tobacco use disorder 6.  History of sarcoidosis of the lung, told to be stable per Dr. Melvyn Novas, on continuous 2 L/min oxygen therapy. 7.  History  of breast cancer, right lumpectomy followed by radiation therapy, presently on oral chemotherapeutic agent.  Recommendation: Patient's EKG is completely normal, she is completely asymptomatic for greater than 24 hours, would recommend either inpatient or outpatient stress testing.  Discussed with the primary team, she can be discharged home today if patient felt comfortable.  They will discussed with her and let me know.  She does need continued risk factor modification.  Her dyspnea has remained stable, I do not suspect pulmonary hypertension.  She is on appropriate medical therapy, blood pressure is well controlled, lipids are being treated with Lipitor.  Adrian Prows 09/09/2017, 11:52 AM

## 2017-09-11 ENCOUNTER — Encounter (HOSPITAL_COMMUNITY): Admission: RE | Admit: 2017-09-11 | Payer: BLUE CROSS/BLUE SHIELD | Source: Ambulatory Visit

## 2017-09-11 DIAGNOSIS — F32A Depression, unspecified: Secondary | ICD-10-CM

## 2017-09-11 DIAGNOSIS — F329 Major depressive disorder, single episode, unspecified: Secondary | ICD-10-CM

## 2017-09-12 ENCOUNTER — Ambulatory Visit (INDEPENDENT_AMBULATORY_CARE_PROVIDER_SITE_OTHER): Payer: BLUE CROSS/BLUE SHIELD | Admitting: Internal Medicine

## 2017-09-12 ENCOUNTER — Encounter: Payer: Self-pay | Admitting: Internal Medicine

## 2017-09-12 VITALS — BP 104/62 | HR 70 | Temp 98.4°F | Ht 65.0 in | Wt 204.8 lb

## 2017-09-12 DIAGNOSIS — H1013 Acute atopic conjunctivitis, bilateral: Secondary | ICD-10-CM | POA: Diagnosis not present

## 2017-09-12 DIAGNOSIS — Z09 Encounter for follow-up examination after completed treatment for conditions other than malignant neoplasm: Secondary | ICD-10-CM

## 2017-09-12 DIAGNOSIS — R110 Nausea: Secondary | ICD-10-CM

## 2017-09-12 MED ORDER — ONDANSETRON HCL 4 MG PO TABS: 4.0000 mg | ORAL_TABLET | Freq: Three times a day (TID) | ORAL | 0 refills | Status: DC | PRN

## 2017-09-12 MED ORDER — OLOPATADINE HCL 0.2 % OP SOLN: 1.0000 [drp] | Freq: Every day | OPHTHALMIC | 5 refills | Status: DC

## 2017-09-12 NOTE — Patient Instructions (Signed)
Use the eye drops for the irritation. If you begin to have lots of purulent drainage that you can't seem to wipe away fast enough, vision changes, eye pain, etc you need to be seen again.   Take the Miralax for a few days to see if that helps with constipation. You can take the Zofran I have prescribed to see if it helps with nausea.   Please call Dr. Einar Gip to schedule your heart testing at [phone].

## 2017-09-13 ENCOUNTER — Encounter (HOSPITAL_COMMUNITY): Payer: BLUE CROSS/BLUE SHIELD

## 2017-09-13 NOTE — Progress Notes (Signed)
Subjective:    [name] - 56 y.o. female MRN [id number]  Date of birth: 03/29/1961  HPI  [name] is here for hospital follow-up. Patient was hospitalized from 10/26-10/28 for chest pain. Troponins were negative and EKG did not reveal any signs of ischemia. Cardiology was consulted. Recommended outpatient stress test. Additionally, due to depression and anxiety Zoloft was started during hospitalization.   Today, patient reports she has not had any chest pain or SOB. She does not yet have cardiology follow up. She does endorse constipation. Reporting that her last bowel movement was over 36 hours ago. It was quite small and she had to strain. She also reports associated nausea and requests medication to help with this symptom. She denies acid reflux type symptoms. Also reports bilateral itchy, red eyes. Has a history of allergic rhinitis and has had more nasal congestion than usual. Denies vision changes, eye pain, or pustular drainage from eyes. Eyes have been more watery than usual.   -  reports that she quit smoking about 13 years ago. She has a 20.00 pack-year smoking history. She has never used smokeless tobacco. - Review of Systems: Per HPI. - Past Medical History: Patient Active Problem List   Diagnosis Date Noted  . Depression   . Nausea without vomiting   . Acute chest pain 09/07/2017  . Knee pain 03/29/2017  . Right ankle pain 03/29/2017  . Obesity (BMI 30-39.9) 01/10/2017  . Dyspnea on exertion 11/09/2016  . Chronic respiratory failure with hypoxia and hypercapnia ([place]) 10/29/2016  . Malnutrition of moderate degree 10/10/2016  . Acute on chronic respiratory failure ([place]) 10/09/2016  . Difficulty in walking, not elsewhere classified   . Depression with anxiety 08/07/2016  . Subacromial impingement of left shoulder 07/19/2016  . Overweight (BMI 25.0-29.9) 05/11/2016  . Hot flashes 02/01/2016  . Constipation 01/13/2016  . Breast cancer of upper-outer quadrant of  right female breast ([place]) 08/04/2015  . Tibial pain 06/03/2015  . Rotator cuff impingement syndrome 09/14/2014  . Menopausal syndrome (hot flashes) 01/19/2014  . GERD (gastroesophageal reflux disease) 12/21/2012  . Hyperlipidemia 12/21/2012  . SICKLE-CELL TRAIT 04/15/2008  . Sarcoidosis ([place]) 01/10/2007  . ANEMIA, IRON DEFICIENCY, UNSPEC. 01/10/2007  . HYPERTENSION, BENIGN SYSTEMIC 01/10/2007  . 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  01/10/2007   - Medications: reviewed and updated   Objective:   Physical Exam BP 104/62   Pulse 70   Temp 98.4 F (36.9 C) (Oral)   Ht 5\' 5"  (1.651 m)   Wt 204 lb 12.8 oz (92.9 kg)   SpO2 97%   BMI 34.08 kg/m  Gen: NAD, alert, cooperative with exam, well-appearing HEENT: NCAT, PERRL, conjunctival injection bilaterally without drainage or foreign bodies identified, oropharynx clear, supple neck CV: RRR, good S1/S2, no murmur, no edema, capillary refill brisk  Resp: CTABL, no wheezes, non-labored Abd: SNTND, BS present, no guarding or organomegaly Skin: no rashes, normal turgor  Neuro: no gross deficits.  Psych: good insight, alert and oriented     Assessment & Plan:   1. Hospital discharge follow-up Patient has been asymptomatic since hospital discharge. Reports compliance with medications. Given Dr. Irven Shelling phone number and advised to call for stress test scheduling.   2. Nausea without vomiting Abdominal exam is benign and patient is afebrile. Low suspicion for something acute such as appendicitis. May be related to constipation which is likely due to reduced mobility during hospitalization. Have recommended resumption of home Miralax and lifestyle modifications  for constipation. Have prescribed Zofran for nausea. Return precautions discussed.   3. Allergic conjunctivitis of both eyes Suspect eye complaints most likely related to allergic conjunctivitis or viral conjunctivitis. Will treat symptoms with [place].  Recommended continued use of home Flonase. Low suspicion for bacterial conjunctivitis given was bilateral involvement from beginning and no significant drainage. Bilateral presentation and lack of pain makes something like acute glaucoma or corneal abrasion very doubtful.    Phill Myron, D.O. 09/13/2017, 3:07 PM PGY-3, [place]

## 2017-09-18 ENCOUNTER — Encounter (HOSPITAL_COMMUNITY): Payer: BLUE CROSS/BLUE SHIELD

## 2017-09-20 ENCOUNTER — Encounter (HOSPITAL_COMMUNITY): Payer: BLUE CROSS/BLUE SHIELD

## 2017-09-24 DIAGNOSIS — J9611 Chronic respiratory failure with hypoxia: Secondary | ICD-10-CM | POA: Diagnosis not present

## 2017-09-24 DIAGNOSIS — J449 Chronic obstructive pulmonary disease, unspecified: Secondary | ICD-10-CM | POA: Diagnosis not present

## 2017-09-24 DIAGNOSIS — R0789 Other chest pain: Secondary | ICD-10-CM | POA: Diagnosis not present

## 2017-09-25 ENCOUNTER — Encounter (HOSPITAL_COMMUNITY)
Admission: RE | Admit: 2017-09-25 | Discharge: 2017-09-25 | Disposition: A | Discharge status: Home / Self Care | Payer: BLUE CROSS/BLUE SHIELD | Source: Ambulatory Visit | Attending: Internal Medicine | Admitting: Internal Medicine

## 2017-09-25 DIAGNOSIS — D860 Sarcoidosis of lung: Secondary | ICD-10-CM

## 2017-09-25 DIAGNOSIS — R0609 Other forms of dyspnea: Secondary | ICD-10-CM | POA: Insufficient documentation

## 2017-09-26 ENCOUNTER — Telehealth: Payer: Self-pay | Admitting: Internal Medicine

## 2017-09-26 NOTE — Telephone Encounter (Signed)
Created in error

## 2017-09-27 ENCOUNTER — Encounter (HOSPITAL_COMMUNITY): Payer: BLUE CROSS/BLUE SHIELD

## 2017-09-27 DIAGNOSIS — R0789 Other chest pain: Secondary | ICD-10-CM | POA: Diagnosis not present

## 2017-09-27 DIAGNOSIS — R0609 Other forms of dyspnea: Secondary | ICD-10-CM | POA: Diagnosis not present

## 2017-09-27 DIAGNOSIS — D860 Sarcoidosis of lung: Secondary | ICD-10-CM | POA: Diagnosis not present

## 2017-09-27 DIAGNOSIS — E782 Mixed hyperlipidemia: Secondary | ICD-10-CM | POA: Diagnosis not present

## 2017-09-27 NOTE — Progress Notes (Signed)
Cardiac Individual Treatment Plan  Patient Details  Name: [name] MRN: [id number] Date of Birth: 02/16/1961 Referring Provider:     Pulmonary Rehab Walk Test from 06/26/2017 in [facility]  Referring Provider  Dr. Melvyn Novas      Initial Encounter Date:    Pulmonary Rehab Walk Test from 06/26/2017 in [facility]  Date  06/26/17  Referring Provider  Dr. Melvyn Novas      Visit Diagnosis: Sarcoidosis  Patient's Home Medications on Admission:  Current Outpatient Medications:  .  acetaminophen (TYLENOL) 500 MG tablet, Take 1,000 mg by mouth every 6 (six) hours as needed for fever. Reported on 05/10/2016, Disp: , Rfl:  .  albuterol (VENTOLIN HFA) 108 (90 Base) MCG/ACT inhaler, Inhale 2 puffs into the lungs every 6 (six) hours as needed for wheezing or shortness of breath., Disp: 1 Inhaler, Rfl: 2 .  aspirin EC 81 MG tablet, Take 81 mg by mouth daily., Disp: , Rfl:  .  atorvastatin (LIPITOR) 20 MG tablet, Take 1 tablet (20 mg total) by mouth daily., Disp: 90 tablet, Rfl: 3 .  fluticasone (FLONASE) 50 MCG/ACT nasal spray, Place 2 sprays into both nostrils daily as needed for allergies., Disp: 16 g, Rfl: 11 .  gabapentin (NEURONTIN) 300 MG capsule, Take 1 capsule (300 mg total) by mouth at bedtime. (Patient taking differently: Take 600 mg by mouth at bedtime. ), Disp: 90 capsule, Rfl: 3 .  metoprolol tartrate (LOPRESSOR) 25 MG tablet, Take 1 tablet (25 mg total) by mouth 2 (two) times daily., Disp: 60 tablet, Rfl: 11 .  Multiple Vitamin (MULTIVITAMIN) capsule, Take 1 capsule by mouth daily., Disp: , Rfl:  .  Olopatadine HCl 0.2 % SOLN, Apply 1 drop to eye daily., Disp: 2.5 mL, Rfl: 5 .  ondansetron (ZOFRAN) 4 MG tablet, Take 1 tablet (4 mg total) by mouth every 8 (eight) hours as needed for nausea or vomiting., Disp: 10 tablet, Rfl: 0 .  OXYGEN, 2lpm with rest and 3-4 lpm with exertion, Disp: , Rfl:  .  predniSONE (DELTASONE) 5 MG tablet,  Take 5 mg by mouth every other day. , Disp: , Rfl:  .  sertraline (ZOLOFT) 50 MG tablet, Take 1 tablet (50 mg total) by mouth daily., Disp: 90 tablet, Rfl: 0 .  Tiotropium Bromide-Olodaterol (STIOLTO RESPIMAT) 2.5-2.5 MCG/ACT AERS, Inhale 2 puffs into the lungs daily., Disp: 1 Inhaler, Rfl: 11 .  triamterene-hydrochlorothiazide (MAXZIDE-25) 37.5-25 MG tablet, TAKE ONE TABLET BY MOUTH ONCE DAILY, Disp: 30 tablet, Rfl: 11  Past Medical History: Past Medical History:  Diagnosis Date  . Allergic rhinitis   . Breast cancer ([facility]) 2016   right breast  . COPD, mild ([place]) FOLLOWED BY DR Melvyn Novas  . GERD (gastroesophageal reflux disease)   . HTN (hypertension)   . Hypertension   . Iron deficiency anemia   . Long-term current use of steroids SYMBICORT INHALER  . 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 Personal history of radiation therapy 2016  . Sarcoidosis STABLE PER CXR JUNE 2013  . Shortness of breath   . Sickle cell trait ([facility])     Tobacco Use: Social History   Tobacco Use  Smoking Status Former Smoker  . Packs/day: 1.00  . Years: 20.00  . Pack years: 20.00  . Last attempt to quit: 11/14/2003  . Years since quitting: 13.8  Smokeless Tobacco Never Used    Labs: Recent Review [facility] for [facility] Cardiac and  Pulmonary Rehab Latest Ref Rng & Units 12/20/2012 09/10/2013 02/04/2015 08/13/2015 02/05/2017   Cholestrol 100 - 199 mg/dL 187 - 178 - 247(H)   LDLCALC 0 - 99 mg/dL 106(H) - 118(H) - 131(H)   HDL >39 mg/dL 53 - 43(L) - 80   Trlycerides 0 - 149 mg/dL 139 - 84 - 178(H)   Hemoglobin A1c - - 5.9 5.9 - -   TCO2 0 - 100 mmol/L - - - 30 -      Capillary Blood Glucose: Lab Results  Component Value Date   GLUCAP 83 07/19/2012     Exercise Target Goals:    Exercise Program Goal: Individual exercise prescription set with THRR, safety & activity barriers. Participant demonstrates ability to understand and report RPE using BORG scale, to self-measure pulse accurately, and to acknowledge the  importance of the exercise prescription.  Exercise Prescription Goal: Starting with aerobic activity 30 plus minutes a day, 3 days per week for initial exercise prescription. Provide home exercise prescription and guidelines that participant acknowledges understanding prior to discharge.  Activity Barriers & Risk Stratification:   6 Minute Walk: 6 Minute Walk    Row Name 06/26/17 1634         6 Minute Walk   Phase  Initial     Distance  1037 feet     Walk Time  5 minutes     # of Rest Breaks  1 1 minute     MPH  1.96     METS  2.53     RPE  11     Perceived Dyspnea   2     Symptoms  No     Resting HR  78 bpm     Resting BP  120/79     Max Ex. HR  118 bpm     Max Ex. BP  175/78     2 Minute Post BP  122/85       Interval HR   Baseline HR (retired)  78     1 Minute HR  97     2 Minute HR  106     3 Minute HR  86     4 Minute HR  101     5 Minute HR  118     6 Minute HR  118     2 Minute Post HR  88     Interval Heart Rate?  Yes       Interval Oxygen   Interval Oxygen?  Yes     Baseline Oxygen Saturation %  94 %     Resting Liters of Oxygen  2 L     1 Minute Oxygen Saturation %  91 %     1 Minute Liters of Oxygen  2 L     2 Minute Oxygen Saturation %  87 %     2 Minute Liters of Oxygen  2 L     3 Minute Oxygen Saturation %  93 %     3 Minute Liters of Oxygen  3 L     4 Minute Oxygen Saturation %  92 %     4 Minute Liters of Oxygen  3 L     5 Minute Oxygen Saturation %  89 %     5 Minute Liters of Oxygen  3 L     6 Minute Oxygen Saturation %  89 %     6 Minute Liters of Oxygen  3 L  2 Minute Post Oxygen Saturation %  96 %     2 Minute Post Liters of Oxygen  3 L        Oxygen Initial Assessment: Oxygen Initial Assessment - 06/26/17 1633      Initial 6 min Walk   Oxygen Used  Continuous;E-Tanks    Liters per minute  3    Resting Oxygen Saturation   93 %    Exercise Oxygen Saturation  during 6 min walk  87 % 87 on 2 liters, titrated to 3 liters       Program Oxygen Prescription   Program Oxygen Prescription  Continuous;E-Tanks    Liters per minute  3       Oxygen Re-Evaluation: Oxygen Re-Evaluation    Row Name 07/11/17 1713 08/03/17 1150 08/31/17 1200 09/24/17 1207       Program Oxygen Prescription   Program Oxygen Prescription  Continuous;E-Tanks  Continuous;E-Tanks  Continuous;E-Tanks  Continuous;E-Tanks    Liters per minute  3  3  3  3       Home Oxygen   Home Oxygen Device  Portable Concentrator;Home Concentrator  Portable Concentrator;Home Concentrator  Portable Concentrator;Home Concentrator  Portable Concentrator;Home Concentrator    Sleep Oxygen Prescription  Continuous  Continuous  Continuous  Continuous    Liters per minute  2  2  2  2     Home Exercise Oxygen Prescription  Continuous  Continuous  Continuous  Continuous    Liters per minute  2  3  3  3     Home at Rest Exercise Oxygen Prescription  Continuous  Continuous  Continuous  Continuous    Liters per minute  2  2  2  2     Compliance with Home Oxygen Use  Yes  Yes  Yes  Yes      Goals/Expected Outcomes   Short Term Goals  To learn and exhibit compliance with exercise, home and travel O2 prescription;To learn and understand importance of monitoring SPO2 with pulse oximeter and demonstrate accurate use of the pulse oximeter.;To learn and understand importance of maintaining oxygen saturations>88%;To learn and demonstrate proper pursed lip breathing techniques or other breathing techniques.;To learn and demonstrate proper use of respiratory medications  To learn and exhibit compliance with exercise, home and travel O2 prescription;To learn and understand importance of monitoring SPO2 with pulse oximeter and demonstrate accurate use of the pulse oximeter.;To learn and understand importance of maintaining oxygen saturations>88%;To learn and demonstrate proper pursed lip breathing techniques or other breathing techniques.;To learn and demonstrate proper use of respiratory  medications  To learn and exhibit compliance with exercise, home and travel O2 prescription;To learn and understand importance of monitoring SPO2 with pulse oximeter and demonstrate accurate use of the pulse oximeter.;To learn and understand importance of maintaining oxygen saturations>88%;To learn and demonstrate proper pursed lip breathing techniques or other breathing techniques.;To learn and demonstrate proper use of respiratory medications  To learn and exhibit compliance with exercise, home and travel O2 prescription;To learn and understand importance of monitoring SPO2 with pulse oximeter and demonstrate accurate use of the pulse oximeter.;To learn and understand importance of maintaining oxygen saturations>88%;To learn and demonstrate proper pursed lip breathing techniques or other breathing techniques.;To learn and demonstrate proper use of respiratory medications    Long  Term Goals  Exhibits compliance with exercise, home and travel O2 prescription;Verbalizes importance of monitoring SPO2 with pulse oximeter and return demonstration;Maintenance of O2 saturations>88%;Exhibits proper breathing techniques, such as pursed lip breathing or other method taught during  program session;Compliance with respiratory medication;Demonstrates proper use of MDI's  Exhibits compliance with exercise, home and travel O2 prescription;Verbalizes importance of monitoring SPO2 with pulse oximeter and return demonstration;Maintenance of O2 saturations>88%;Exhibits proper breathing techniques, such as pursed lip breathing or other method taught during program session;Compliance with respiratory medication;Demonstrates proper use of MDI's  Exhibits compliance with exercise, home and travel O2 prescription;Verbalizes importance of monitoring SPO2 with pulse oximeter and return demonstration;Maintenance of O2 saturations>88%;Exhibits proper breathing techniques, such as pursed lip breathing or other method taught during  program session;Compliance with respiratory medication;Demonstrates proper use of MDI's  Exhibits compliance with exercise, home and travel O2 prescription;Verbalizes importance of monitoring SPO2 with pulse oximeter and return demonstration;Maintenance of O2 saturations>88%;Exhibits proper breathing techniques, such as pursed lip breathing or other method taught during program session;Compliance with respiratory medication;Demonstrates proper use of MDI's    Goals/Expected Outcomes  -  patient compliant with home oxygen requirements and meets goals as stated above  patient compliant with home oxygen requirements and meets goals as stated above  patient compliant with home oxygen requirements and meets goals as stated above       Oxygen Discharge (Final Oxygen Re-Evaluation): Oxygen Re-Evaluation - 09/24/17 1207      Program Oxygen Prescription   Program Oxygen Prescription  Continuous;E-Tanks    Liters per minute  3      Home Oxygen   Home Oxygen Device  Portable Concentrator;Home Concentrator    Sleep Oxygen Prescription  Continuous    Liters per minute  2    Home Exercise Oxygen Prescription  Continuous    Liters per minute  3    Home at Rest Exercise Oxygen Prescription  Continuous    Liters per minute  2    Compliance with Home Oxygen Use  Yes      Goals/Expected Outcomes   Short Term Goals  To learn and exhibit compliance with exercise, home and travel O2 prescription;To learn and understand importance of monitoring SPO2 with pulse oximeter and demonstrate accurate use of the pulse oximeter.;To learn and understand importance of maintaining oxygen saturations>88%;To learn and demonstrate proper pursed lip breathing techniques or other breathing techniques.;To learn and demonstrate proper use of respiratory medications    Long  Term Goals  Exhibits compliance with exercise, home and travel O2 prescription;Verbalizes importance of monitoring SPO2 with pulse oximeter and return  demonstration;Maintenance of O2 saturations>88%;Exhibits proper breathing techniques, such as pursed lip breathing or other method taught during program session;Compliance with respiratory medication;Demonstrates proper use of MDI's    Goals/Expected Outcomes  patient compliant with home oxygen requirements and meets goals as stated above       Initial Exercise Prescription: Initial Exercise Prescription - 06/26/17 1600      Date of Initial Exercise RX and Referring Provider   Date  06/26/17    Referring Provider  Dr. Melvyn Novas      Oxygen   Oxygen  Continuous    Liters  3      Bike   Level  0.5    Minutes  17      NuStep   Level  2    Minutes  17    METs  1.4      Track   Laps  5    Minutes  17      Prescription Details   Frequency (times per week)  2    Duration  Progress to 45 minutes of aerobic exercise without signs/symptoms of physical distress  Intensity   THRR 40-80% of Max Heartrate  65-131    Ratings of Perceived Exertion  11-13    Perceived Dyspnea  0-4      Progression   Progression  Continue progressive overload as per policy without signs/symptoms or physical distress.      Resistance Training   Training Prescription  Yes    Weight  blue bands    Reps  10-15       Perform Capillary Blood Glucose checks as needed.  Exercise Prescription Changes: Exercise Prescription Changes    Row Name 07/03/17 1200 07/05/17 1200 07/10/17 1200 07/12/17 1200 07/24/17 1200     Response to Exercise   Blood Pressure (Admit)  118/68  138/80  110/68  132/60  122/62   Blood Pressure (Exercise)  126/72  134/84  132/68  130/70  126/82   Blood Pressure (Exit)  114/70  110/70  104/60  116/70  102/64   Heart Rate (Admit)  72 bpm  65 bpm  78 bpm  67 bpm  78 bpm   Heart Rate (Exercise)  104 bpm  104 bpm  108 bpm  95 bpm  98 bpm   Heart Rate (Exit)  75 bpm  80 bpm  71 bpm  67 bpm  85 bpm   Oxygen Saturation (Admit)  92 %  96 %  98 %  98 %  100 %   Oxygen Saturation  (Exercise)  89 %  90 %  87 %  89 %  96 %   Oxygen Saturation (Exit)  99 %  98 %  98 %  99 %  97 %   Rating of Perceived Exertion (Exercise)  11  11  13  13  13    Perceived Dyspnea (Exercise)  2  1  3  2  2    Duration  Progress to 45 minutes of aerobic exercise without signs/symptoms of physical distress  Progress to 45 minutes of aerobic exercise without signs/symptoms of physical distress  Progress to 45 minutes of aerobic exercise without signs/symptoms of physical distress  Progress to 45 minutes of aerobic exercise without signs/symptoms of physical distress  Progress to 45 minutes of aerobic exercise without signs/symptoms of physical distress   Intensity  Other (comment) 40-80% of HRR  Other (comment) 40-80% of HRR  Other (comment) 40-80% of HRR  THRR unchanged  THRR unchanged     Progression   Progression  Continue to progress workloads to maintain intensity without signs/symptoms of physical distress.  Continue to progress workloads to maintain intensity without signs/symptoms of physical distress.  Continue to progress workloads to maintain intensity without signs/symptoms of physical distress.  Continue to progress workloads to maintain intensity without signs/symptoms of physical distress.  Continue to progress workloads to maintain intensity without signs/symptoms of physical distress.     Resistance Training   Training Prescription  Yes  Yes  Yes  Yes  Yes   Weight  blue bands  blue bands  blue bands  blue bands  blue bands   Reps  10-15  10-15  10-15  10-15  10-15   Time  10 Minutes  10 Minutes  10 Minutes  10 Minutes  10 Minutes     Oxygen   Oxygen  Continuous  Continuous  Continuous  Continuous  Continuous   Liters  3  3  3  3  3      Bike   Level  0.5  -  0.5  0.5  0.5  Minutes  17  -  17  17  17      NuStep   Level  2  2  3   -  4   Minutes  17  17  17   -  17   METs  1.9  2.1  2.1  -  2.1     Track   Laps  5  12  11  11   -   Minutes  17  17  17  17   -   Row Name  07/26/17 1200 07/31/17 1600 08/02/17 1200 08/07/17 1200 08/09/17 1200     Response to Exercise   Blood Pressure (Admit)  146/76  102/70  122/76  116/62  116/64   Blood Pressure (Exercise)  150/76  136/74  130/64  122/82  108/70   Blood Pressure (Exit)  122/80  120/70  112/70  114/75  104/68   Heart Rate (Admit)  78 bpm  85 bpm  80 bpm  77 bpm  77 bpm   Heart Rate (Exercise)  107 bpm  93 bpm  110 bpm  105 bpm  94 bpm   Heart Rate (Exit)  90 bpm  82 bpm  81 bpm  81 bpm  73 bpm   Oxygen Saturation (Admit)  93 %  96 %  96 %  95 %  97 %   Oxygen Saturation (Exercise)  88 %  88 %  91 %  90 %  93 %   Oxygen Saturation (Exit)  97 %  97 %  98 %  97 %  98 %   Rating of Perceived Exertion (Exercise)  13  12  13  13  9    Perceived Dyspnea (Exercise)  2  2  2  2  1    Duration  Progress to 45 minutes of aerobic exercise without signs/symptoms of physical distress  Progress to 45 minutes of aerobic exercise without signs/symptoms of physical distress  Progress to 45 minutes of aerobic exercise without signs/symptoms of physical distress  Progress to 45 minutes of aerobic exercise without signs/symptoms of physical distress  Progress to 45 minutes of aerobic exercise without signs/symptoms of physical distress   Intensity  THRR unchanged  THRR unchanged  THRR unchanged  THRR unchanged  THRR unchanged     Progression   Progression  Continue to progress workloads to maintain intensity without signs/symptoms of physical distress.  Continue to progress workloads to maintain intensity without signs/symptoms of physical distress.  Continue to progress workloads to maintain intensity without signs/symptoms of physical distress.  Continue to progress workloads to maintain intensity without signs/symptoms of physical distress.  Continue to progress workloads to maintain intensity without signs/symptoms of physical distress.     Resistance Training   Training Prescription  Yes  Yes  Yes  Yes  Yes   Weight  blue bands   b;ue bands  b;ue bands  b;ue bands  b;ue bands   Reps  10-15  10-15  10-15  10-15  10-15   Time  10 Minutes  10 Minutes  10 Minutes  10 Minutes  10 Minutes     Oxygen   Oxygen  Continuous  -  Continuous  Continuous  Continuous   Liters  3  -  4  4  4      Bike   Level  0.5  0.5  0.5  0.5  -   Minutes  17  17  17  17   -  NuStep   Level  4  4  -  5  5   Minutes  17  17  -  17  17   METs  1.9  1.9  -  2.1  2     Track   Laps  9  -  12  9  10    Minutes  17  -  17  17  17    Row Name 08/14/17 1200 08/21/17 1300 08/23/17 1200 08/28/17 1200 08/30/17 1200     Response to Exercise   Blood Pressure (Admit)  118/62  142/84  118/80  122/74  116/66   Blood Pressure (Exercise)  138/80  142/84  102/60  112/70  120/70   Blood Pressure (Exit)  100/60  108/72  125/77  106/64  104/70   Heart Rate (Admit)  84 bpm  75 bpm  80 bpm  80 bpm  83 bpm   Heart Rate (Exercise)  102 bpm  103 bpm  104 bpm  111 bpm  99 bpm   Heart Rate (Exit)  89 bpm  81 bpm  88 bpm  90 bpm  88 bpm   Oxygen Saturation (Admit)  94 %  93 %  96 %  95 %  97 %   Oxygen Saturation (Exercise)  89 %  92 %  87 %  92 %  93 %   Oxygen Saturation (Exit)  98 %  99 %  96 %  98 %  99 %   Rating of Perceived Exertion (Exercise)  14  13  13  14  11    Perceived Dyspnea (Exercise)  2  2  2  3  2    Duration  Progress to 45 minutes of aerobic exercise without signs/symptoms of physical distress  Progress to 45 minutes of aerobic exercise without signs/symptoms of physical distress  Progress to 45 minutes of aerobic exercise without signs/symptoms of physical distress  Progress to 45 minutes of aerobic exercise without signs/symptoms of physical distress  Progress to 45 minutes of aerobic exercise without signs/symptoms of physical distress   Intensity  THRR unchanged  THRR unchanged  THRR unchanged  THRR unchanged  THRR unchanged     Progression   Progression  Continue to progress workloads to maintain intensity without signs/symptoms of physical  distress.  Continue to progress workloads to maintain intensity without signs/symptoms of physical distress.  Continue to progress workloads to maintain intensity without signs/symptoms of physical distress.  Continue to progress workloads to maintain intensity without signs/symptoms of physical distress.  Continue to progress workloads to maintain intensity without signs/symptoms of physical distress.     Resistance Training   Training Prescription  Yes  Yes  Yes  Yes  Yes   Weight  b;ue bands  b;ue bands  b;ue bands  b;ue bands  blue bands   Reps  10-15  10-15  10-15  10-15  10-15   Time  10 Minutes  10 Minutes  10 Minutes  10 Minutes  10 Minutes     Oxygen   Oxygen  Continuous  Continuous  Continuous  Continuous  Continuous   Liters  4  4  4  4  4      Bike   Level  0.5  -  0.5  0.8  0.8   Minutes  17  -  17  17  17      NuStep   Level  5  5  5  5   5  Minutes  17  17  17  17  17    METs  2  1.9  2.1  1.9  1.8     Track   Laps  7  11  11  7   -   Minutes  17  17  17  17   -     Home Exercise Plan   Plans to continue exercise at  Home (comment)  -  -  -  -   Frequency  Add 3 additional days to program exercise sessions.  -  -  -  -   Row Name 09/04/17 1100 09/06/17 1200           Response to Exercise   Blood Pressure (Admit)  102/60  118/60      Blood Pressure (Exercise)  104/70  120/76      Blood Pressure (Exit)  114/60  114/60      Heart Rate (Admit)  87 bpm  84 bpm      Heart Rate (Exercise)  104 bpm  110 bpm      Heart Rate (Exit)  83 bpm  86 bpm      Oxygen Saturation (Admit)  96 %  95 %      Oxygen Saturation (Exercise)  90 %  92 %      Oxygen Saturation (Exit)  98 %  97 %      Rating of Perceived Exertion (Exercise)  12  13      Perceived Dyspnea (Exercise)  2  2      Duration  Progress to 45 minutes of aerobic exercise without signs/symptoms of physical distress  Progress to 45 minutes of aerobic exercise without signs/symptoms of physical distress      Intensity   THRR unchanged  THRR unchanged        Progression   Progression  Continue to progress workloads to maintain intensity without signs/symptoms of physical distress.  Continue to progress workloads to maintain intensity without signs/symptoms of physical distress.        Resistance Training   Training Prescription  Yes  Yes      Weight  blue bands  blue bands      Reps  10-15  10-15      Time  10 Minutes  10 Minutes        Oxygen   Oxygen  Continuous  Continuous      Liters  4  4        Bike   Level  1  1      Minutes  17  17        NuStep   Level  6  -      Minutes  17  -      METs  1.9  -        Track   Laps  10  12      Minutes  17  17         Exercise Comments: Exercise Comments    Row Name 08/14/17 1651           Exercise Comments  home exercise completed          Exercise Goals and Review: Exercise Goals    [place] Name 06/18/17 1420             Exercise Goals   Increase Physical Activity  Yes       Intervention  Provide advice, education, support and counseling about  physical activity/exercise needs.;Develop an individualized exercise prescription for aerobic and resistive training based on initial evaluation findings, risk stratification, comorbidities and participant's personal goals.       Expected Outcomes  Achievement of increased cardiorespiratory fitness and enhanced flexibility, muscular endurance and strength shown through measurements of functional capacity and personal statement of participant.       Increase Strength and Stamina  Yes       Intervention  Provide advice, education, support and counseling about physical activity/exercise needs.;Develop an individualized exercise prescription for aerobic and resistive training based on initial evaluation findings, risk stratification, comorbidities and participant's personal goals.       Expected Outcomes  Achievement of increased cardiorespiratory fitness and enhanced flexibility, muscular endurance and  strength shown through measurements of functional capacity and personal statement of participant.          Exercise Goals Re-Evaluation : Exercise Goals Re-Evaluation    Row Name 07/10/17 0962 07/30/17 1214 09/03/17 0803 09/27/17 1636       Exercise Goal Re-Evaluation   Exercise Goals Review  Increase Physical Activity;Increase Strength and Stamina  Increase Physical Activity;Increase Strength and Stamina;Able to understand and use Dyspnea scale;Able to understand and use rate of perceived exertion (RPE) scale;Knowledge and understanding of Target Heart Rate Range (THRR);Understanding of Exercise Prescription  Increase Physical Activity;Increase Strength and Stamina;Able to understand and use Dyspnea scale;Able to understand and use rate of perceived exertion (RPE) scale;Knowledge and understanding of Target Heart Rate Range (THRR);Understanding of Exercise Prescription  Increase Strength and Stamina;Increase Physical Activity;Able to understand and use Dyspnea scale;Able to understand and use rate of perceived exertion (RPE) scale;Knowledge and understanding of Target Heart Rate Range (THRR);Understanding of Exercise Prescription    Comments  Patient has only attended two exercise sessions. Will cont. to monitor and progress as able.   Patient is slowly progressing in program. Very excited when lost 1.1 pounds during last session. Very proud of her achievement. States that she is watching her diet. Will complete home exercise.    Patient is progressing well in program. Attendance weighs heavily on getting a ride to rehab. Very motivated to make changes. Is exercising at home. Will cont. to monitor and progress as able.   Patient has been out due to chest pain. Performed stress test-all was normal. Patient will return to rehab next week.     Expected Outcomes  Through exercise and education at rehab and at home, patient will increase strength and stamina. Patient will also gain a better understanding of  the need for physical activity on a daily basis and the effects it can have on quality of life.  Through exercise and education at rehab and at home, patient will increase strength and stamina. Patient will also gain a better understanding of the need for physical activity on a daily basis and the effects it can have on quality of life.  Through exercise and education at rehab and at home, patient will increase strength and stamina. Patient will also gain a better understanding of the need for physical activity on a daily basis and the effects it can have on quality of life.  Through exercise and education at rehab and at home, patient will increase strength and stamina. Patient will also gain a better understanding of the need for physical activity on a daily basis and the effects it can have on quality of life.        Discharge Exercise Prescription (Final Exercise Prescription Changes): Exercise Prescription Changes - 09/06/17  1200      Response to Exercise   Blood Pressure (Admit)  118/60    Blood Pressure (Exercise)  120/76    Blood Pressure (Exit)  114/60    Heart Rate (Admit)  84 bpm    Heart Rate (Exercise)  110 bpm    Heart Rate (Exit)  86 bpm    Oxygen Saturation (Admit)  95 %    Oxygen Saturation (Exercise)  92 %    Oxygen Saturation (Exit)  97 %    Rating of Perceived Exertion (Exercise)  13    Perceived Dyspnea (Exercise)  2    Duration  Progress to 45 minutes of aerobic exercise without signs/symptoms of physical distress    Intensity  THRR unchanged      Progression   Progression  Continue to progress workloads to maintain intensity without signs/symptoms of physical distress.      Resistance Training   Training Prescription  Yes    Weight  blue bands    Reps  10-15    Time  10 Minutes      Oxygen   Oxygen  Continuous    Liters  4      Bike   Level  1    Minutes  17      Track   Laps  12    Minutes  17       Nutrition:  Target Goals: Understanding of  nutrition guidelines, daily intake of sodium <1531m, cholesterol <2069m calories 30% from fat and 7% or less from saturated fats, daily to have 5 or more servings of fruits and vegetables.  Biometrics: Pre Biometrics - 06/18/17 1447      Pre Biometrics   Grip Strength  29 kg        Nutrition Therapy Plan and Nutrition Goals: Nutrition Therapy & Goals - 07/09/17 1515      Nutrition Therapy   Diet  Therapeutic Lifestyle Changes      Personal Nutrition Goals   Nutrition Goal  Identify food quantities necessary to achieve wt loss of 1-2 lb per week to a goal wt loss of 2.7-10.9 kg (6-24 lb) at graduation from pulmonary rehab.    Personal Goal #2  Describe the benefit of including fruits, vegetables, whole grains, and low-fat dairy products in a healthy meal plan.      Intervention Plan   Intervention  Prescribe, educate and counsel regarding individualized specific dietary modifications aiming towards targeted core components such as weight, hypertension, lipid management, diabetes, heart failure and other comorbidities.    Expected Outcomes  Short Term Goal: Understand basic principles of dietary content, such as calories, fat, sodium, cholesterol and nutrients.;Long Term Goal: Adherence to prescribed nutrition plan.       Nutrition Discharge: Nutrition Scores: Nutrition Assessments - 07/09/17 1515      Rate Your Plate Scores   Pre Score  52       Nutrition Goals Re-Evaluation:   Nutrition Goals Re-Evaluation:   Nutrition Goals Discharge (Final Nutrition Goals Re-Evaluation):   Psychosocial: Target Goals: Acknowledge presence or absence of significant depression and/or stress, maximize coping skills, provide positive support system. Participant is able to verbalize types and ability to use techniques and skills needed for reducing stress and depression.  Initial Review & Psychosocial Screening: Initial Psych Review & Screening - 06/18/17 1438      Initial Review    Current issues with  Current Sleep Concerns sleep deprevation from hot flashes  Family Dynamics   Good Support System?  Yes    Comments  recent loss of mother      Barriers   Psychosocial barriers to participate in program  There are no identifiable barriers or psychosocial needs.      Screening Interventions   Interventions  Encouraged to exercise       Quality of Life Scores:   PHQ-9: Recent Review Flowsheet Data    Depression screen [facility] 2/9 06/18/2017 04/12/2017 03/29/2017 03/13/2017 02/05/2017   Decreased Interest 0 0 0 0 0   Down, Depressed, Hopeless 0 0 0 0 0   PHQ - 2 Score 0 0 0 0 0     Interpretation of Total Score  Total Score Depression Severity:  1-4 = Minimal depression, 5-9 = Mild depression, 10-14 = Moderate depression, 15-19 = Moderately severe depression, 20-27 = Severe depression   Psychosocial Evaluation and Intervention: Psychosocial Evaluation - 06/18/17 1440      Psychosocial Evaluation & Interventions   Interventions  Encouraged to exercise with the program and follow exercise prescription    Expected Outcomes  patient will remain free from psychosocial barriers    Continue Psychosocial Services   Follow up required by staff       Psychosocial Re-Evaluation: Psychosocial Re-Evaluation    Barnes Name 07/11/17 1716 08/03/17 1153 08/31/17 1209 09/24/17 1210       Psychosocial Re-Evaluation   Current issues with  Current Sleep Concerns  Current Sleep Concerns  None Identified  None Identified    Comments  recent loss of mother  recent loss of mother  sleep concerns have resolved  -    Expected Outcomes  patient will remain free from psychosocial barriers to participation in pulmonary rehab  patient will remain free from psychosocial barriers to participation in pulmonary rehab  patient will remain free from psychosocial barriers to participation in pulmonary rehab  patient will remain free from psychosocial barriers to participation in pulmonary rehab     Interventions  Encouraged to attend Pulmonary Rehabilitation for the exercise  Encouraged to attend Pulmonary Rehabilitation for the exercise  Encouraged to attend Pulmonary Rehabilitation for the exercise  Encouraged to attend Pulmonary Rehabilitation for the exercise    Continue Psychosocial Services   Follow up required by staff  Follow up required by staff  Follow up required by staff  Follow up required by staff       Psychosocial Discharge (Final Psychosocial Re-Evaluation): Psychosocial Re-Evaluation - 09/24/17 1210      Psychosocial Re-Evaluation   Current issues with  None Identified    Expected Outcomes  patient will remain free from psychosocial barriers to participation in pulmonary rehab    Interventions  Encouraged to attend Pulmonary Rehabilitation for the exercise    Continue Psychosocial Services   Follow up required by staff       Vocational Rehabilitation: Provide vocational rehab assistance to qualifying candidates.   Vocational Rehab Evaluation & Intervention:   Education: Education Goals: Education classes will be provided on a weekly basis, covering required topics. Participant will state understanding/return demonstration of topics presented.  Learning Barriers/Preferences: Learning Barriers/Preferences - 06/18/17 1436      Learning Barriers/Preferences   Learning Barriers  None    Learning Preferences  Computer/Internet;Group Instruction;Individual Instruction;Verbal Instruction;Written Material       Education Topics: Count Your Pulse:  -Group instruction provided by verbal instruction, demonstration, patient participation and written materials to support subject.  Instructors address importance of being able to find your  pulse and how to count your pulse when at home without a heart monitor.  Patients get hands on experience counting their pulse with staff help and individually.   Heart Attack, Angina, and Risk Factor Modification:  -Group  instruction provided by verbal instruction, video, and written materials to support subject.  Instructors address signs and symptoms of angina and heart attacks.    Also discuss risk factors for heart disease and how to make changes to improve heart health risk factors.   Functional Fitness:  -Group instruction provided by verbal instruction, demonstration, patient participation, and written materials to support subject.  Instructors address safety measures for doing things around the house.  Discuss how to get up and down off the floor, how to pick things up properly, how to safely get out of a chair without assistance, and balance training.   Meditation and Mindfulness:  -Group instruction provided by verbal instruction, patient participation, and written materials to support subject.  Instructor addresses importance of mindfulness and meditation practice to help reduce stress and improve awareness.  Instructor also leads participants through a meditation exercise.    Stretching for Flexibility and Mobility:  -Group instruction provided by verbal instruction, patient participation, and written materials to support subject.  Instructors lead participants through series of stretches that are designed to increase flexibility thus improving mobility.  These stretches are additional exercise for major muscle groups that are typically performed during regular warm up and cool down.   Hands Only CPR:  -Group verbal, video, and participation provides a basic overview of AHA guidelines for community CPR. Role-play of emergencies allow participants the opportunity to practice calling for help and chest compression technique with discussion of AED use.   Hypertension: -Group verbal and written instruction that provides a basic overview of hypertension including the most recent diagnostic guidelines, risk factor reduction with self-care instructions and medication management.    Nutrition I class:  Heart Healthy Eating:  -Group instruction provided by PowerPoint slides, verbal discussion, and written materials to support subject matter. The instructor gives an explanation and review of the Therapeutic Lifestyle Changes diet recommendations, which includes a discussion on lipid goals, dietary fat, sodium, fiber, plant stanol/sterol esters, sugar, and the components of a well-balanced, healthy diet.   Nutrition II class: Lifestyle Skills:  -Group instruction provided by PowerPoint slides, verbal discussion, and written materials to support subject matter. The instructor gives an explanation and review of label reading, grocery shopping for heart health, heart healthy recipe modifications, and ways to make healthier choices when eating out.   Diabetes Question & Answer:  -Group instruction provided by PowerPoint slides, verbal discussion, and written materials to support subject matter. The instructor gives an explanation and review of diabetes co-morbidities, pre- and post-prandial blood glucose goals, pre-exercise blood glucose goals, signs, symptoms, and treatment of hypoglycemia and hyperglycemia, and foot care basics.   Diabetes Blitz:  -Group instruction provided by PowerPoint slides, verbal discussion, and written materials to support subject matter. The instructor gives an explanation and review of the physiology behind type 1 and type 2 diabetes, diabetes medications and rational behind using different medications, pre- and post-prandial blood glucose recommendations and Hemoglobin A1c goals, diabetes diet, and exercise including blood glucose guidelines for exercising safely.    Portion Distortion:  -Group instruction provided by PowerPoint slides, verbal discussion, written materials, and food models to support subject matter. The instructor gives an explanation of serving size versus portion size, changes in portions sizes over the last 20 years,  and what consists of a serving from  each food group.   Stress Management:  -Group instruction provided by verbal instruction, video, and written materials to support subject matter.  Instructors review role of stress in heart disease and how to cope with stress positively.     Exercising on Your Own:  -Group instruction provided by verbal instruction, power point, and written materials to support subject.  Instructors discuss benefits of exercise, components of exercise, frequency and intensity of exercise, and end points for exercise.  Also discuss use of nitroglycerin and activating EMS.  Review options of places to exercise outside of rehab.  Review guidelines for sex with heart disease.   Cardiac Drugs I:  -Group instruction provided by verbal instruction and written materials to support subject.  Instructor reviews cardiac drug classes: antiplatelets, anticoagulants, beta blockers, and statins.  Instructor discusses reasons, side effects, and lifestyle considerations for each drug class.   Cardiac Drugs II:  -Group instruction provided by verbal instruction and written materials to support subject.  Instructor reviews cardiac drug classes: angiotensin converting enzyme inhibitors (ACE-I), angiotensin II receptor blockers (ARBs), nitrates, and calcium channel blockers.  Instructor discusses reasons, side effects, and lifestyle considerations for each drug class.   Anatomy and Physiology of the Circulatory System:  Group verbal and written instruction and models provide basic cardiac anatomy and physiology, with the coronary electrical and arterial systems. Review of: AMI, Angina, Valve disease, Heart Failure, Peripheral Artery Disease, Cardiac Arrhythmia, Pacemakers, and the ICD.   Other Education:  -Group or individual verbal, written, or video instructions that support the educational goals of the cardiac rehab program.   Knowledge Questionnaire Score:   Core Components/Risk Factors/Patient Goals at  Admission: Personal Goals and Risk Factors at Admission - 06/18/17 1436      Core Components/Risk Factors/Patient Goals on Admission    Weight Management  Obesity;Yes    Intervention  Weight Management: Develop a combined nutrition and exercise program designed to reach desired caloric intake, while maintaining appropriate intake of nutrient and fiber, sodium and fats, and appropriate energy expenditure required for the weight goal.;Weight Management/Obesity: Establish reasonable short term and long term weight goals.;Weight Management: Provide education and appropriate resources to help participant work on and attain dietary goals.;Obesity: Provide education and appropriate resources to help participant work on and attain dietary goals.    Expected Outcomes  Short Term: Continue to assess and modify interventions until short term weight is achieved;Long Term: Adherence to nutrition and physical activity/exercise program aimed toward attainment of established weight goal;Understanding of distribution of calorie intake throughout the day with the consumption of 4-5 meals/snacks;Understanding recommendations for meals to include 15-35% energy as protein, 25-35% energy from fat, 35-60% energy from carbohydrates, less than 225m of dietary cholesterol, 20-35 gm of total fiber daily;Weight Loss: Understanding of general recommendations for a balanced deficit meal plan, which promotes 1-2 lb weight loss per week and includes a negative energy balance of [phone] kcal/d    Improve shortness of breath with ADL's  Yes    Intervention  Provide education, individualized exercise plan and daily activity instruction to help decrease symptoms of SOB with activities of daily living.    Expected Outcomes  Short Term: Achieves a reduction of symptoms when performing activities of daily living.    Develop more efficient breathing techniques such as purse lipped breathing and diaphragmatic breathing; and practicing  self-pacing with activity  Yes    Intervention  Provide education, demonstration and support about specific breathing techniuqes  utilized for more efficient breathing. Include techniques such as pursed lipped breathing, diaphragmatic breathing and self-pacing activity.    Expected Outcomes  Short Term: Participant will be able to demonstrate and use breathing techniques as needed throughout daily activities.    Increase knowledge of respiratory medications and ability to use respiratory devices properly   Yes    Intervention  Provide education and demonstration as needed of appropriate use of medications, inhalers, and oxygen therapy.    Expected Outcomes  Short Term: Achieves understanding of medications use. Understands that oxygen is a medication prescribed by physician. Demonstrates appropriate use of inhaler and oxygen therapy.       Core Components/Risk Factors/Patient Goals Review:  Goals and Risk Factor Review    Row Name 07/11/17 1715 08/03/17 1152 08/31/17 1207 09/24/17 1208       Core Components/Risk Factors/Patient Goals Review   Personal Goals Review  Weight Management/Obesity;Develop more efficient breathing techniques such as purse lipped breathing and diaphragmatic breathing and practicing self-pacing with activity.;Improve shortness of breath with ADL's  Weight Management/Obesity;Develop more efficient breathing techniques such as purse lipped breathing and diaphragmatic breathing and practicing self-pacing with activity.;Improve shortness of breath with ADL's  Weight Management/Obesity;Develop more efficient breathing techniques such as purse lipped breathing and diaphragmatic breathing and practicing self-pacing with activity.;Improve shortness of breath with ADL's  Weight Management/Obesity;Develop more efficient breathing techniques such as purse lipped breathing and diaphragmatic breathing and practicing self-pacing with activity.;Improve shortness of breath with ADL's    Review   Patient has attended 3 sessions since admission. She works hard and is tolerating small workload increases. it is too soon to evaluate progress towards goals. Will re-eval in 30 days  patient continues to work hard in pulmonary rehab. she has lost 1.1 kg at her last visit. she is watching her caloric intake and making smarter food choices. she is tolerating small workload increases and states she really enjoys coming to pulmonary rehab.  patient continues to progress in pulmonary rehab. Her weight remains stable but she is not gaining weight. She continues to tolerate workload increases.  patient was doing well in pulmonary rehab until she was placed on medical hold for chest discomfort. She is scheduled for a stress test on 11/12 and depending on results, medical hold will be removed.    Expected Outcomes  see admission expected outcomes  see admission expected outcomes  see admission expected outcomes  see admission expected outcomes       Core Components/Risk Factors/Patient Goals at Discharge (Final Review):  Goals and Risk Factor Review - 09/24/17 1208      Core Components/Risk Factors/Patient Goals Review   Personal Goals Review  Weight Management/Obesity;Develop more efficient breathing techniques such as purse lipped breathing and diaphragmatic breathing and practicing self-pacing with activity.;Improve shortness of breath with ADL's    Review  patient was doing well in pulmonary rehab until she was placed on medical hold for chest discomfort. She is scheduled for a stress test on 11/12 and depending on results, medical hold will be removed.    Expected Outcomes  see admission expected outcomes       ITP Comments:   Comments: ITP REVIEW Patient on medial hold. Unable to evaluate progress.

## 2017-09-28 ENCOUNTER — Ambulatory Visit (INDEPENDENT_AMBULATORY_CARE_PROVIDER_SITE_OTHER): Payer: BLUE CROSS/BLUE SHIELD | Admitting: Internal Medicine

## 2017-09-28 ENCOUNTER — Encounter: Payer: Self-pay | Admitting: Internal Medicine

## 2017-09-28 VITALS — BP 114/80 | HR 79 | Ht 65.0 in | Wt 206.0 lb

## 2017-09-28 DIAGNOSIS — J449 Chronic obstructive pulmonary disease, unspecified: Secondary | ICD-10-CM | POA: Diagnosis not present

## 2017-09-28 DIAGNOSIS — D869 Sarcoidosis, unspecified: Secondary | ICD-10-CM

## 2017-09-28 DIAGNOSIS — J9612 Chronic respiratory failure with hypercapnia: Secondary | ICD-10-CM

## 2017-09-28 DIAGNOSIS — J9611 Chronic respiratory failure with hypoxia: Secondary | ICD-10-CM | POA: Diagnosis not present

## 2017-09-28 MED ORDER — PREDNISONE 5 MG (21) PO TBPK: ORAL_TABLET | ORAL | 0 refills | Status: DC

## 2017-09-28 MED ORDER — AMOXICILLIN-POT CLAVULANATE 875-125 MG PO TABS: 1.0000 | ORAL_TABLET | Freq: Two times a day (BID) | ORAL | 0 refills | Status: AC

## 2017-09-28 NOTE — Progress Notes (Signed)
Subjective:    Patient ID: [name], female   DOB: [birth year]    MRN: [id number]    Brief patient profile:  60 yobf quit smoking 10/2004 dx by transbronchial biopsy 5/92 with NCG inflammatory change consistent with sarcoid. Since that time she's been off and on prednisone multiple  times with flares of coughing dyspnea and skin involvement > weaned off chronic prednisone Feb 2012 and placed back on 10/10/16 for deteriorating obstructive changes on pfts  With resp failure/ 02 dep        History of Present Illness   02/20/2017  f/u ov/Wert re:   GOLD III/ pred 10 mg daily and bevespi 2bid  and alb rare  Chief Complaint  Patient presents with  . Follow-up    6wk rov. pt states breathing is baseline. pt reports of sob with exertion & occ prod cough with green mucus mainly in the morning   Doe better :  [facility] = can't walk a nl pace on a flat grade s sob but does fine slow and flat eg shopping on  2lpm / ok at rest s 02  Uses 02 2lpm sleeping  rec Drop prednisone to 10 mg one half daily      NP eval 05/14/17  We will decrease your prednisone to 2.5 mg daily. We will prescribe 5 mg tablets. We will renew your prescription for Symbicort.  2 puffs twice daily. Rinse mouth after use. Follow up appointment with Dr. Melvyn Novas in 2 months. We will refer you to Pulmonary rehab > started Aug 2018  Referral to a dietician for weight loss >  at rehab     07/17/2017  f/u ov/Wert re:   Sarcoid/ ? Still steroid dep at 2.5mg  daily / 02 none at rest, 2lpm with activity and 3lpm ex  Chief Complaint  Patient presents with  . Follow-up    COPD, still using Symbicort, still SOB with exertion, she has to increase her oxygen to 3L   Doing 3lpm when exercising at rehab and no need for saba on symb 160 2bid  Keeping up with med calendar much better  rec Try prednisone 5 mg take one half on even days only Make a food diary for your dietician to review (list everything you eat/drink x one week for her  feedback on what you are doing right vs wrong) See Tammy NP 3 months with all your medications    09/03/2017  f/u ov/Wert re: disability paperwork on 5 mg even days with h/o sarcoid and severe copd  Chief Complaint  Patient presents with  . Follow-up    Pt states that her breathing has improved with pulmonary rehab. She is taking 5 mg pred every other day and using ventolin 3-4 x per wk.    last worked Oct 04 2016  02 2lpm sitting/ 3lpm with activity and 4lpm with rehab  Can do 15 min on new step machine  s stopping on "5"  And stationery bike at home x 15 min at "3"  Had worked [employer] / carrying 5lb to desk fiinish it  =  100 ft and handed to next person  Lint was bad at workplace and seemed to aggravate cough/ sob  maint symb 160 2bid bid and use saba qod when goes outside eg to mailbox  Has had independent eval x one month prior to OV   Most am's sob going to br before am symbicort/sleeping ok on  on one pillow  rec Plan A =  Automatic =    Stiolto 2 puffs each am  Work on inhaler technique:  Let me know if too expensive on your insurance Plan B = Backup Only use your albuterol as a rescue medication   Keep appt with Tammy for 10/17/17    Date of Admission: 09/07/2017                    Date of Discharge: 09/09/17    Discharge Diagnoses/Problem List:  COPD GOLD stage IV HTN Anemia HLD Breast cancer s/p resection Sarcoidosis GERD  Disposition: home  Discharge Condition: improved  Discharge Exam: see progress note from day of discharge  Brief Hospital Course:  Patient presented with substernal acute chest pressure and subjective heart fluttering x 1 day. Atypical symptoms - occurred at rest and did not change with exertion. No hx of similar, non radiating. In the ED, patient received aspirin 325 mg once. Patient was also given a GI cocktail for nausea.Chest x-ray was unremarkable. Troponins were negative 2. CBC and BMP were within normal limits. EKG without  signs of ischemia. Cardiology was consulted, recommended continued medical optimization and outpatient stress test. Mild anemia noted, anemia panel with borderline low ferritin and iron. Hx of iron deficiency anemia, not on supplementation. Given "heart fluttering," TSH was checked and normal. Patient mentioned depression and anxiety combined with stress of medical burden, desired starting SSRI. Zoloft was started as no hx of mania.   Issues for Follow Up:  1. ACS - cardiology (Dr. Einar Gip) recommended outpatient stress testing. Patient amenable to this.  2. Remainder of medications were continued from home. Patient recently changed to Stiolto per pulm, would ensure she is using this correctly.  3. Zoloft was started for depression, given 90 day supply.   Significant Procedures: none  Significant Labs and Imaging:   LastLabs   Recent Labs Lab 09/07/17 1516 09/08/17 0019 09/08/17 0649  WBC 7.5 6.2 6.1  HGB 11.4* 10.7* 10.5*  HCT 35.9* 33.8* 33.1*  PLT 303 286 291      LastLabs   Recent Labs Lab 09/07/17 1516 09/08/17 0019 09/08/17 0649  NA 140  --  140  K 4.5  --  3.5  CL 98*  --  97*  CO2 32  --  32  GLUCOSE 105*  --  94  BUN 11  --  13  CREATININE 0.91 0.87 0.81  CALCIUM 9.8  --  9.6     Trop neg x 3.   Results/Tests Pending at Time of Discharge: none  Discharge Medications:      Allergies as of 09/09/2017      Reactions   Codeine Other (See Comments)   Avoids-felt bad when took before              Medication List     STOP taking these medications   budesonide-formoterol 160-4.5 MCG/ACT inhaler Commonly known as:  SYMBICORT   diphenhydrAMINE 25 MG tablet Commonly known as:  BENADRYL   ibuprofen 600 MG tablet Commonly known as:  ADVIL,MOTRIN         09/28/2017  f/u ov/Wert re: post hosp f/u  02 2lpm 24/7  Chief Complaint  Patient presents with  . Follow-up    Pt states had to go to ED 09/07/17 with CP- resolved after  nitro. She c/o increased cough with thick, green sputum. She has some bumps under her nose and on both knees- itchy. She has had some blurred vision and redness- seen PCP and dxed  with pink eye- no better after txed with eye gtts they gave her.   on omeprazole 20 before bfast and Pepcid 20 mg at time of admit with dx "gerd induced cp" On the 2.5 qod dosing for sarcoid has had progressive decline and now on 02 24/7/ new skin changes typical for sarcoid under nose and on knees plus am cough x sev weeks green mucus assoc with nasal congestion    No obvious day to day or daytime variability or assoc e  mucus plugs or hemoptysis or cp or chest tightness, subjective wheeze or overt  hb symptoms. No unusual exp hx or h/o childhood pna/ asthma or knowledge of premature birth.  Sleeping ok flat without nocturnal  or early am exacerbation  of respiratory  c/o's or need for noct saba. Also denies any obvious fluctuation of symptoms with weather or environmental changes or other aggravating or alleviating factors except as outlined above   Current Allergies, Complete Past Medical History, Past Surgical History, Family History, and Social History were reviewed in [employer] record.  ROS  The following are not active complaints unless bolded Hoarseness, sore throat, dysphagia, dental problems, itching, sneezing,  nasal congestion or discharge of excess mucus or purulent secretions, ear ache,   fever, chills, sweats, unintended wt loss or wt gain, classically pleuritic or exertional cp,  orthopnea pnd or leg swelling, presyncope, palpitations, abdominal pain, anorexia, nausea, vomiting, diarrhea  or change in bowel habits or change in bladder habits, change in stools or change in urine, dysuria, hematuria,  rash, arthralgias, visual complaints, headache, numbness, weakness or ataxia or problems with walking or coordination,  change in mood/affect or memory.        Current Meds  Medication Sig   . acetaminophen (TYLENOL) 500 MG tablet Take 1,000 mg by mouth every 6 (six) hours as needed for fever. Reported on 05/10/2016  . albuterol (VENTOLIN HFA) 108 (90 Base) MCG/ACT inhaler Inhale 2 puffs into the lungs every 6 (six) hours as needed for wheezing or shortness of breath.  Marland Kitchen aspirin EC 81 MG tablet Take 81 mg by mouth daily.  Marland Kitchen atorvastatin (LIPITOR) 20 MG tablet Take 1 tablet (20 mg total) by mouth daily.  . fluticasone (FLONASE) 50 MCG/ACT nasal spray Place 2 sprays into both nostrils daily as needed for allergies.  Marland Kitchen gabapentin (NEURONTIN) 300 MG capsule Take 1 capsule (300 mg total) by mouth at bedtime. (Patient taking differently: Take 600 mg by mouth at bedtime. )  . metoprolol tartrate (LOPRESSOR) 25 MG tablet Take 1 tablet (25 mg total) by mouth 2 (two) times daily.  . Multiple Vitamin (MULTIVITAMIN) capsule Take 1 capsule by mouth daily.  . Olopatadine HCl 0.2 % SOLN Apply 1 drop to eye daily.  . ondansetron (ZOFRAN) 4 MG tablet Take 1 tablet (4 mg total) by mouth every 8 (eight) hours as needed for nausea or vomiting.  . OXYGEN 2lpm with rest and 3-4 lpm with exertion  . predniSONE (DELTASONE) 5 MG tablet Take 5 mg by mouth every other day.   . sertraline (ZOLOFT) 50 MG tablet Take 1 tablet (50 mg total) by mouth daily.  . Tiotropium Bromide-Olodaterol (STIOLTO RESPIMAT) 2.5-2.5 MCG/ACT AERS Inhale 2 puffs into the lungs daily.  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 Unice Cobble  -h/o Daily prednisone since 2007   > off completely Feb 2012 with no problem> restarted  09/2016  ? of SUPERFICIAL VEIN THROMBOSIS (ICD-453.9)   ENDOMETRIAL POLYP (ICD-621.0)   UTERINE FIBROID (ICD-218.9)  RHINITIS, ALLERGIC  (ICD-477.9)  HYPERTENSION, BENIGN SYSTEMIC (ICD-401.1)  COPD (ICD-496)  - PFT's 06/26/08 FEV1 1.09 ratio  - PFTs 03/02/10 FEV1 1.06 ratio 34  - PFT's 08/01/2011 FEV1 1.6 (42%)  41 ratio  DLCO 55 corrects 77 - HFA  50% 06/05/2011  > 75% 06/29/2011  - Spiriva trial March 02, 2010 > better but no worse off 04/2011 - Rehab started mid aug 2018  BACK PAIN, LOW (ICD-724.2)  ANEMIA, IRON DEFICIENCY, UNSPEC. (ICD-280.9)  Health Maintenance.......................................................   [facility]   Family History:  crohn`s in daughter, M-lupus, htn, asthma,  no Ca, DM, CAD    Social History:   lives with twin children and grandson. single; >20 pack year history, quit in 2005; no EtOH  Laid off  from works for [employer] Sept 2013           Objective:   Physical Exam wt 166 Oct 12, 2009 >170 October 28, 2010 > 169 06/29/2011 >>171 08/24/2011 > 01/15/2012  171 > 04/24/2012 176 > 06/28/2012  175> 08/13/2012 > 08/30/2012  173 > 170 11/28/2012 > 01/29/2013  176 > 174 >173 06/16/2013 > 171 09/18/2013 > 12/19/2013 177 >179 04/13/2014>  04/23/2014 177 > 08/21/2014 184 >182 11/27/2014 > 05/12/2015 177  > 05/11/2016   177 > 10/27/2016  175 > 11/09/2016 182 > 01/09/2017 198 > 02/20/2017  206 >  07/17/2017   209 >  09/03/2017   210 > 09/28/2017  206   amb bf nad / vital signs reviewed   -  Note on arrival 02 sats  95% on 2lpm POC pulsed      HEENT: nl dentition, turbinates bilaterally, and oropharynx. Nl external ear canals without cough reflex   NECK :  without JVD/Nodes/TM/ nl carotid upstrokes bilaterally   LUNGS: no acc muscle use,  Nl contour chest with minimal insp and exp rhonchi bilaterally    CV:  RRR  no s3 or murmur or increase in P2, and no edema   ABD:  soft and nontender with nl inspiratory excursion in the supine position. No bruits or organomegaly appreciated, bowel sounds nl  MS:  Nl gait/ ext warm without deformities, calf tenderness, cyanosis or clubbing No obvious  joint restrictions   SKIN: warm and dry with typical sarcoid slt hypopigmented  plaque under R nostril   NEURO:  alert, approp, nl sensorium with  no motor or cerebellar deficits apparent.        I personally reviewed images and agree with radiology impression as follows:  CXR:   09/07/17 The cardiomediastinal silhouette is normal in size. Normal pulmonary vascularity. Unchanged bibasilar and right middle lobe scarring/atelectasis. Emphysematous changes. No focal consolidation, pleural effusion, or pneumothorax. No acute osseous abnormality.        . Assessment:

## 2017-09-28 NOTE — Patient Instructions (Addendum)
With cough/ shortness/chest pain > omperzole 40 mg Take 30- 60 min before your first and last meals of the day and pepcid 20 mg at bedtime unitl better then back to 20 mg just taken 30 min before bfast    Augmentin 875 mg take one pill twice daily  X 10 days - take at breakfast and supper with large glass of water.  It would help reduce the usual side effects (diarrhea and yeast infections) if you ate cultured yogurt at lunch.   Prednisone 20 mg daily until better, then 10 mg daily x 5 days, then 5mg  daily until see Tammy with all your medications and pill boxes   See your eye doctor asap  Keep appt with Tammy NP - consider adding plaquenil

## 2017-09-30 ENCOUNTER — Other Ambulatory Visit: Payer: Self-pay | Admitting: Family Medicine

## 2017-09-30 DIAGNOSIS — K219 Gastro-esophageal reflux disease without esophagitis: Secondary | ICD-10-CM

## 2017-10-01 ENCOUNTER — Telehealth: Payer: Self-pay | Admitting: Internal Medicine

## 2017-10-01 ENCOUNTER — Encounter: Payer: Self-pay | Admitting: Internal Medicine

## 2017-10-01 NOTE — Telephone Encounter (Signed)
Called and spoke with the pharmacy and they will get this rx ready for the pt.  I called the pt and she is aware and nothing further is needed.

## 2017-10-01 NOTE — Assessment & Plan Note (Signed)
New start 09/2016 at admit see records - 10/18/16 Patient Saturations on Room Air at Rest = 92% Patient Saturations on Room Air while Ambulating = 84% Patient Saturations on 2 Liters of oxygen while Ambulating = 90% - HCO3  11/09/2016 = 39  - HCO3   03/16/17        = 34   As of  09/28/2017 rec 2lpm sleeping and titrate with ambulation to maintain > 90%

## 2017-10-01 NOTE — Assessment & Plan Note (Signed)
Quit smoking 2005  - PFT's 06/26/08 FEV1 1.09 ratio  - PFTs 03/02/10 FEV1 1.06 ratio 34    PFT's 08/01/2011 FEV1 1.6 (42%)  41 ratio  DLCO 55 corrects 77 - Spiriva trial March 02, 2010 > ? better but not worse off it 04/2011 - 05/12/2015 p extensive coaching HFA effectiveness =    90%  - Referred to Rehab 08/21/2014 > could not arrange due to schedule  -med calendar 06/16/2013 > did not bring to office as requested 12/19/13 or 04/23/14  Or 08/21/2014  - 11/09/2016    changed symb to bevespi since taking chronic pred for pf    - 02/20/2017 decreased pred to 5 mg daily > weaned to 2.5 mg qd as of 07/17/2017  - started rehab mid Aug 2018  - 07/17/2017 changed to pred 2.5 even days > did not tolerate so changed  Around  07/28/17 back to 5 mg qod  - 09/03/2017  After extensive coaching HFA effectiveness =    90% so try stiolto  - Spirometry 09/03/2017  FEV1 0.52 (23%)  Ratio 40 with typical curvature     No change rx = stiolto 2 pffs q am   rx acute worsening with pred 20 (to cover ? sarcoid airways also) augmentin for possible sinusitis clinically > f/u with sinus ct if not fully recovered

## 2017-10-01 NOTE — Assessment & Plan Note (Signed)
Positive transbronchial biopsy 02/19/91 by Dr. Unice Cobble  -Daily prednisone since 2007 with ? adrenal insufficiency iatrogenic > off completely Feb 2012 with no recurrence - PFT's 06/26/08 FEV1  1.09 ratio  - PFTs  03/02/10 FEV1  1.06 (42%) ratio 34 and no better p B2,  DLC0 48% corrects to 64% - PFT's 08/01/2011 FEV1 1.6 (42%)  41 ratio  DLCO 55 corrects 77 - PFTs 10/10/16    FEV1  0.87(38)ratio 41 and DLCO 36/39c corrects to 51 for alv vol > placed back on daily pred  - 02/20/2017 decreased pred to 5 mg daily > to 2.5mg  daily as of 07/17/2017  - 07/17/2017 try 2.5 mg qod > 09/28/2017 changed to 20 mg until better and new floor of 5 mg daily plus consider plaquenil due to new skin involvement on face    The goal with a chronic steroid dependent illness is always arriving at the lowest effective dose that controls the disease/symptoms and not accepting a set "formula" which is based on statistics or guidelines that don't always take into account patient  variability or the natural hx of the dz in every individual patient, which may well vary over time.  For now therefore I recommend the patient maintain  20 mg until better then work to new floor of 5 mg daily pending f/u for full med reconciliation/ new med calendar.   I had an extended discussion with the patient reviewing all relevant studies completed to date and  lasting 15 to 20 minutes of a 25 minute transition of care post hosp f/u office visit    Each maintenance medication was reviewed in detail including most importantly the difference between maintenance and prns and under what circumstances the prns are to be triggered using an action plan format that is not reflected in the computer generated alphabetically organized AVS.    Please see AVS for specific instructions unique to this visit that I personally wrote and verbalized to the the pt in detail and then reviewed with pt  by my nurse highlighting any  changes in therapy recommended at  today's visit to their plan of care.

## 2017-10-02 ENCOUNTER — Encounter (HOSPITAL_COMMUNITY)
Admission: RE | Admit: 2017-10-02 | Discharge: 2017-10-02 | Disposition: A | Discharge status: Home / Self Care | Payer: BLUE CROSS/BLUE SHIELD | Source: Ambulatory Visit | Attending: Internal Medicine | Admitting: Internal Medicine

## 2017-10-02 ENCOUNTER — Telehealth (HOSPITAL_COMMUNITY): Payer: Self-pay | Admitting: Internal Medicine

## 2017-10-09 ENCOUNTER — Encounter (HOSPITAL_COMMUNITY)
Admission: RE | Admit: 2017-10-09 | Discharge: 2017-10-09 | Disposition: A | Discharge status: Home / Self Care | Payer: BLUE CROSS/BLUE SHIELD | Source: Ambulatory Visit | Attending: Internal Medicine | Admitting: Internal Medicine

## 2017-10-09 VITALS — Wt 205.7 lb

## 2017-10-09 DIAGNOSIS — D860 Sarcoidosis of lung: Secondary | ICD-10-CM

## 2017-10-09 DIAGNOSIS — R0609 Other forms of dyspnea: Secondary | ICD-10-CM | POA: Diagnosis not present

## 2017-10-09 NOTE — Progress Notes (Signed)
Daily Session Note  Patient Details  Name: [name] MRN: [id number] Date of Birth: [birth year] Referring Provider:     Pulmonary Rehab Walk Test from 06/26/2017 in [place]  Referring Provider  Dr. Melvyn Novas      Encounter Date: 10/09/2017  Check In: Session Check In - 10/09/17 1030      Check-In   Location  [facility]    Staff Present  Rosebud Poles, RN, BSN;Molly diVincenzo, MS, ACSM RCEP, Exercise Physiologist;Portia Rollene Rotunda, RN, Roque Cash, RN    Supervising physician immediately available to respond to emergencies  [facility] immediately available    Physician(s)  Dr. Maylene Roes    Medication changes reported      No    Fall or balance concerns reported     No    Tobacco Cessation  No Change    Warm-up and Cool-down  Performed as group-led instruction    Resistance Training Performed  Yes    VAD Patient?  No      Pain Assessment   Currently in Pain?  No/denies    Multiple Pain Sites  No       Capillary Blood Glucose: No results found for this or any previous visit (from the past 24 hour(s)).  Exercise Prescription Changes - 10/09/17 1200      Response to Exercise   Blood Pressure (Admit)  112/64    Blood Pressure (Exercise)  96/64    Blood Pressure (Exit)  110/60    Heart Rate (Admit)  72 bpm    Heart Rate (Exercise)  112 bpm    Heart Rate (Exit)  81 bpm    Oxygen Saturation (Admit)  94 %    Oxygen Saturation (Exercise)  88 %    Oxygen Saturation (Exit)  99 %    Rating of Perceived Exertion (Exercise)  12    Perceived Dyspnea (Exercise)  1    Duration  Progress to 45 minutes of aerobic exercise without signs/symptoms of physical distress    Intensity  THRR unchanged      Progression   Progression  Continue to progress workloads to maintain intensity without signs/symptoms of physical distress.      Resistance Training   Training Prescription  Yes    Weight  blue bands    Reps  10-15    Time  10  Minutes      Oxygen   Oxygen  Continuous    Liters  4      Bike   Level  0.8    Minutes  17      NuStep   Level  6    Minutes  17    METs  2.3      Track   Laps  10    Minutes  17       Social History   Tobacco Use  Smoking Status Former Smoker  . Packs/day: 1.00  . Years: 20.00  . Pack years: 20.00  . Last attempt to quit: 11/14/2003  . Years since quitting: 13.9  Smokeless Tobacco Never Used    Goals Met:  Exercise tolerated well No report of cardiac concerns or symptoms Strength training completed today  Goals Unmet:  Not Applicable  Comments: Service time is from 1030 to 1215    Dr. Rush Farmer is Medical Director for Pulmonary Rehab at [facility].

## 2017-10-11 ENCOUNTER — Encounter (HOSPITAL_COMMUNITY)
Admission: RE | Admit: 2017-10-11 | Discharge: 2017-10-11 | Disposition: A | Discharge status: Home / Self Care | Payer: BLUE CROSS/BLUE SHIELD | Source: Ambulatory Visit | Attending: Internal Medicine | Admitting: Internal Medicine

## 2017-10-11 VITALS — Wt 206.1 lb

## 2017-10-11 DIAGNOSIS — R0609 Other forms of dyspnea: Secondary | ICD-10-CM | POA: Diagnosis not present

## 2017-10-11 DIAGNOSIS — D860 Sarcoidosis of lung: Secondary | ICD-10-CM

## 2017-10-11 NOTE — Progress Notes (Signed)
Daily Session Note  Patient Details  Name: [name] MRN: [id number] Date of Birth: 1961/08/16 Referring Provider:     Pulmonary Rehab Walk Test from 06/26/2017 in [place]  Referring Provider  Dr. Melvyn Novas      Encounter Date: 10/11/2017  Check In: Session Check In - 10/11/17 1024      Check-In   Location  [facility]    Staff Present  Rosebud Poles, RN, BSN;Molly diVincenzo, MS, ACSM RCEP, Exercise Physiologist;Portia Rollene Rotunda, RN, Roque Cash, RN    Supervising physician immediately available to respond to emergencies  [facility] immediately available    Physician(s)  Dr. Nevada Crane    Medication changes reported      No    Fall or balance concerns reported     No    Tobacco Cessation  No Change    Warm-up and Cool-down  Performed as group-led instruction    Resistance Training Performed  Yes    VAD Patient?  No      Pain Assessment   Currently in Pain?  No/denies    Multiple Pain Sites  No       Capillary Blood Glucose: No results found for this or any previous visit (from the past 24 hour(s)).  Exercise Prescription Changes - 10/11/17 1300      Response to Exercise   Blood Pressure (Admit)  104/66    Blood Pressure (Exercise)  128/64    Blood Pressure (Exit)  108/64    Heart Rate (Admit)  71 bpm    Heart Rate (Exercise)  93 bpm    Heart Rate (Exit)  69 bpm    Oxygen Saturation (Admit)  96 %    Oxygen Saturation (Exercise)  93 %    Oxygen Saturation (Exit)  95 %    Rating of Perceived Exertion (Exercise)  12    Perceived Dyspnea (Exercise)  1    Duration  Progress to 45 minutes of aerobic exercise without signs/symptoms of physical distress    Intensity  THRR unchanged      Progression   Progression  Continue to progress workloads to maintain intensity without signs/symptoms of physical distress.      Resistance Training   Training Prescription  Yes    Weight  blue bands    Reps  10-15    Time  10  Minutes      Oxygen   Oxygen  Continuous    Liters  2-3      Bike   Level  0.8    Minutes  17      NuStep   Level  6    Minutes  17    METs  2.1       Social History   Tobacco Use  Smoking Status Former Smoker  . Packs/day: 1.00  . Years: 20.00  . Pack years: 20.00  . Last attempt to quit: 11/14/2003  . Years since quitting: 13.9  Smokeless Tobacco Never Used    Goals Met:  Personal goals reviewed No report of cardiac concerns or symptoms Strength training completed today  Goals Unmet:  Not Applicable  Comments: Service time is from 1030 to 1235    Dr. Rush Farmer is Medical Director for Pulmonary Rehab at [facility].

## 2017-10-16 ENCOUNTER — Telehealth (HOSPITAL_COMMUNITY): Payer: Self-pay | Admitting: Internal Medicine

## 2017-10-16 ENCOUNTER — Encounter (HOSPITAL_COMMUNITY)
Admission: RE | Admit: 2017-10-16 | Discharge: 2017-10-16 | Disposition: A | Discharge status: Home / Self Care | Payer: BLUE CROSS/BLUE SHIELD | Source: Ambulatory Visit | Attending: Internal Medicine | Admitting: Internal Medicine

## 2017-10-16 DIAGNOSIS — R0609 Other forms of dyspnea: Secondary | ICD-10-CM | POA: Insufficient documentation

## 2017-10-17 ENCOUNTER — Encounter: Payer: BLUE CROSS/BLUE SHIELD | Admitting: Adult Health

## 2017-10-18 ENCOUNTER — Encounter (HOSPITAL_COMMUNITY)
Admission: RE | Admit: 2017-10-18 | Discharge: 2017-10-18 | Disposition: A | Discharge status: Home / Self Care | Payer: BLUE CROSS/BLUE SHIELD | Source: Ambulatory Visit | Attending: Internal Medicine | Admitting: Internal Medicine

## 2017-10-18 ENCOUNTER — Encounter (HOSPITAL_COMMUNITY): Payer: BLUE CROSS/BLUE SHIELD

## 2017-10-18 VITALS — Wt 205.9 lb

## 2017-10-18 DIAGNOSIS — R0609 Other forms of dyspnea: Secondary | ICD-10-CM | POA: Diagnosis not present

## 2017-10-18 DIAGNOSIS — D860 Sarcoidosis of lung: Secondary | ICD-10-CM

## 2017-10-18 NOTE — Progress Notes (Signed)
Daily Session Note  Patient Details  Name: [name] MRN: [id number] Date of Birth: 1960-11-28 Referring Provider:     Pulmonary Rehab Walk Test from 06/26/2017 in [place]  Referring Provider  Dr. Melvyn Novas      Encounter Date: 10/18/2017  Check In: Session Check In - 10/18/17 1049      Check-In   Location  [facility]    Staff Present  Trish Fountain, RN, BSN;Lisa Ysidro Evert, RN;Joan Virden, RN, BSN;Molly diVincenzo, MS, ACSM RCEP, Exercise Physiologist    Supervising physician immediately available to respond to emergencies  [facility] immediately available    Physician(s)  Dr. Starla Link    Medication changes reported      No    Fall or balance concerns reported     No    Tobacco Cessation  No Change    Warm-up and Cool-down  Performed as group-led instruction    Resistance Training Performed  Yes    VAD Patient?  No      Pain Assessment   Currently in Pain?  No/denies    Multiple Pain Sites  No       Capillary Blood Glucose: No results found for this or any previous visit (from the past 24 hour(s)).  Exercise Prescription Changes - 10/18/17 1200      Response to Exercise   Blood Pressure (Admit)  110/60    Blood Pressure (Exercise)  134/80    Blood Pressure (Exit)  100/64    Heart Rate (Admit)  87 bpm    Heart Rate (Exercise)  94 bpm    Heart Rate (Exit)  88 bpm    Oxygen Saturation (Admit)  94 %    Oxygen Saturation (Exercise)  94 %    Oxygen Saturation (Exit)  99 %    Rating of Perceived Exertion (Exercise)  12.5    Perceived Dyspnea (Exercise)  1    Duration  Progress to 45 minutes of aerobic exercise without signs/symptoms of physical distress    Intensity  THRR unchanged      Progression   Progression  Continue to progress workloads to maintain intensity without signs/symptoms of physical distress.      Resistance Training   Training Prescription  Yes    Weight  blue bands    Reps  10-15    Time  10  Minutes      Oxygen   Oxygen  Continuous    Liters  2-3      Bike   Level  0.8    Minutes  17      NuStep   Level  7    Minutes  17    METs  2       Social History   Tobacco Use  Smoking Status Former Smoker  . Packs/day: 1.00  . Years: 20.00  . Pack years: 20.00  . Last attempt to quit: 11/14/2003  . Years since quitting: 13.9  Smokeless Tobacco Never Used    Goals Met:  Independence with exercise equipment Improved SOB with ADL's Using PLB without cueing & demonstrates good technique Exercise tolerated well No report of cardiac concerns or symptoms Strength training completed today  Goals Unmet:  Not Applicable  Comments: Service time is from 1030 to 1230   Dr. Rush Farmer is Medical Director for Pulmonary Rehab at [facility].

## 2017-10-18 NOTE — Progress Notes (Signed)
Pulmonary Individual Treatment Plan  Patient Details  Name: [name] MRN: [id number] Date of Birth: Jul 16, 1961 Referring Provider:     Pulmonary Rehab Walk Test from 06/26/2017 in [facility]  Referring Provider  Dr. Melvyn Novas      Initial Encounter Date:    Pulmonary Rehab Walk Test from 06/26/2017 in [facility]  Date  06/26/17  Referring Provider  Dr. Melvyn Novas      Visit Diagnosis: Sarcoidosis of lung ([place])  Patient's Home Medications on Admission:   Current Outpatient Medications:  .  acetaminophen (TYLENOL) 500 MG tablet, Take 1,000 mg by mouth every 6 (six) hours as needed for fever. Reported on 05/10/2016, Disp: , Rfl:  .  albuterol (VENTOLIN HFA) 108 (90 Base) MCG/ACT inhaler, Inhale 2 puffs into the lungs every 6 (six) hours as needed for wheezing or shortness of breath., Disp: 1 Inhaler, Rfl: 2 .  aspirin EC 81 MG tablet, Take 81 mg by mouth daily., Disp: , Rfl:  .  atorvastatin (LIPITOR) 20 MG tablet, Take 1 tablet (20 mg total) by mouth daily., Disp: 90 tablet, Rfl: 3 .  fluticasone (FLONASE) 50 MCG/ACT nasal spray, Place 2 sprays into both nostrils daily as needed for allergies., Disp: 16 g, Rfl: 11 .  gabapentin (NEURONTIN) 300 MG capsule, Take 1 capsule (300 mg total) by mouth at bedtime. (Patient taking differently: Take 600 mg by mouth at bedtime. ), Disp: 90 capsule, Rfl: 3 .  metoprolol tartrate (LOPRESSOR) 25 MG tablet, Take 1 tablet (25 mg total) by mouth 2 (two) times daily., Disp: 60 tablet, Rfl: 11 .  Multiple Vitamin (MULTIVITAMIN) capsule, Take 1 capsule by mouth daily., Disp: , Rfl:  .  Olopatadine HCl 0.2 % SOLN, Apply 1 drop to eye daily., Disp: 2.5 mL, Rfl: 5 .  omeprazole (PRILOSEC) 20 MG capsule, TAKE 1 CAPSULE BY MOUTH ONCE DAILY, Disp: 30 capsule, Rfl: 5 .  ondansetron (ZOFRAN) 4 MG tablet, Take 1 tablet (4 mg total) by mouth every 8 (eight) hours as needed for nausea or vomiting., Disp: 10  tablet, Rfl: 0 .  OXYGEN, 2lpm with rest and 3-4 lpm with exertion, Disp: , Rfl:  .  predniSONE (DELTASONE) 5 MG tablet, Take 5 mg by mouth every other day. , Disp: , Rfl:  .  predniSONE (STERAPRED UNI-PAK 21 TAB) 5 MG (21) TBPK tablet, 20 mg daily until then 10 mg daily x 5 days then 5 mg daily, Disp: 100 tablet, Rfl: 0 .  sertraline (ZOLOFT) 50 MG tablet, Take 1 tablet (50 mg total) by mouth daily., Disp: 90 tablet, Rfl: 0 .  Tiotropium Bromide-Olodaterol (STIOLTO RESPIMAT) 2.5-2.5 MCG/ACT AERS, Inhale 2 puffs into the lungs daily., Disp: 1 Inhaler, Rfl: 11 .  triamterene-hydrochlorothiazide (MAXZIDE-25) 37.5-25 MG tablet, TAKE ONE TABLET BY MOUTH ONCE DAILY, Disp: 30 tablet, Rfl: 11  Past Medical History: Past Medical History:  Diagnosis Date  . Allergic rhinitis   . Breast cancer ([place]) 2016   right breast  . COPD, mild ([place]) FOLLOWED BY DR Melvyn Novas  . GERD (gastroesophageal reflux disease)   . HTN (hypertension)   . Hypertension   . Iron deficiency anemia   . Long-term current use of steroids SYMBICORT INHALER  . 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 Personal history of radiation therapy 2016  . Sarcoidosis STABLE PER CXR JUNE 2013  . Shortness of breath   . Sickle cell trait ([place])     Tobacco Use: Social History  Tobacco Use  Smoking Status Former Smoker  . Packs/day: 1.00  . Years: 20.00  . Pack years: 20.00  . Last attempt to quit: 11/14/2003  . Years since quitting: 13.9  Smokeless Tobacco Never Used    Labs: Recent Review Flowsheet Data    Labs for ITP Cardiac and Pulmonary Rehab Latest Ref Rng & Units 12/20/2012 09/10/2013 02/04/2015 08/13/2015 02/05/2017   Cholestrol 100 - 199 mg/dL 187 - 178 - 247(H)   LDLCALC 0 - 99 mg/dL 106(H) - 118(H) - 131(H)   HDL >39 mg/dL 53 - 43(L) - 80   Trlycerides 0 - 149 mg/dL 139 - 84 - 178(H)   Hemoglobin A1c - - 5.9 5.9 - -   TCO2 0 - 100 mmol/L - - - 30 -      Capillary Blood Glucose: Lab Results  Component Value Date   GLUCAP 83 07/19/2012      Pulmonary Assessment Scores:   Pulmonary Function Assessment: Pulmonary Function Assessment - 06/18/17 1436      Breath   Bilateral Breath Sounds  Clear;Decreased    Shortness of Breath  Yes;Limiting activity       Exercise Target Goals:    Exercise Program Goal: Individual exercise prescription set with THRR, safety & activity barriers. Participant demonstrates ability to understand and report RPE using BORG scale, to self-measure pulse accurately, and to acknowledge the importance of the exercise prescription.  Exercise Prescription Goal: Starting with aerobic activity 30 plus minutes a day, 3 days per week for initial exercise prescription. Provide home exercise prescription and guidelines that participant acknowledges understanding prior to discharge.  Activity Barriers & Risk Stratification:   6 Minute Walk: 6 Minute Walk    Row Name 06/26/17 1634         6 Minute Walk   Phase  Initial     Distance  1037 feet     Walk Time  5 minutes     # of Rest Breaks  1 1 minute     MPH  1.96     METS  2.53     RPE  11     Perceived Dyspnea   2     Symptoms  No     Resting HR  78 bpm     Resting BP  120/79     Max Ex. HR  118 bpm     Max Ex. BP  175/78     2 Minute Post BP  122/85       Interval HR   Baseline HR (retired)  78     1 Minute HR  97     2 Minute HR  106     3 Minute HR  86     4 Minute HR  101     5 Minute HR  118     6 Minute HR  118     2 Minute Post HR  88     Interval Heart Rate?  Yes       Interval Oxygen   Interval Oxygen?  Yes     Baseline Oxygen Saturation %  94 %     Resting Liters of Oxygen  2 L     1 Minute Oxygen Saturation %  91 %     1 Minute Liters of Oxygen  2 L     2 Minute Oxygen Saturation %  87 %     2 Minute Liters of Oxygen  2 L  3 Minute Oxygen Saturation %  93 %     3 Minute Liters of Oxygen  3 L     4 Minute Oxygen Saturation %  92 %     4 Minute Liters of Oxygen  3 L     5 Minute Oxygen Saturation %  89 %      5 Minute Liters of Oxygen  3 L     6 Minute Oxygen Saturation %  89 %     6 Minute Liters of Oxygen  3 L     2 Minute Post Oxygen Saturation %  96 %     2 Minute Post Liters of Oxygen  3 L        Oxygen Initial Assessment: Oxygen Initial Assessment - 06/26/17 1633      Initial 6 min Walk   Oxygen Used  Continuous;E-Tanks    Liters per minute  3    Resting Oxygen Saturation   93 %    Exercise Oxygen Saturation  during 6 min walk  87 % 87 on 2 liters, titrated to 3 liters      Program Oxygen Prescription   Program Oxygen Prescription  Continuous;E-Tanks    Liters per minute  3       Oxygen Re-Evaluation: Oxygen Re-Evaluation    Row Name 07/11/17 1713 08/03/17 1150 08/31/17 1200 09/24/17 1207 10/16/17 1410     Program Oxygen Prescription   Program Oxygen Prescription  Continuous;E-Tanks  Continuous;E-Tanks  Continuous;E-Tanks  Continuous;E-Tanks  Continuous;E-Tanks   Liters per minute  _0 Home Oxygen   Home Oxygen Device  Portable Concentrator;Home Concentrator  Portable Concentrator;Home Concentrator  Portable Concentrator;Home Concentrator  Portable Concentrator;Home Concentrator  Portable Concentrator;Home Concentrator   Sleep Oxygen Prescription  Continuous  Continuous  Continuous  Continuous  Continuous   Liters per minute  _1 Home Exercise Oxygen Prescription  Continuous  Continuous  Continuous  Continuous  Continuous   Liters per minute  _2 Home at Rest Exercise Oxygen Prescription  Continuous  Continuous  Continuous  Continuous  Continuous   Liters per minute  _3 Compliance with Home Oxygen Use  Yes  Yes  Yes  Yes  Yes     Goals/Expected Outcomes   Short Term Goals  To learn and exhibit compliance with exercise, home and travel O2 prescription;To learn and understand importance of monitoring SPO2 with pulse oximeter and demonstrate accurate use of the pulse oximeter.;To learn and understand importance of  maintaining oxygen saturations>88%;To learn and demonstrate proper pursed lip breathing techniques or other breathing techniques.;To learn and demonstrate proper use of respiratory medications  To learn and exhibit compliance with exercise, home and travel O2 prescription;To learn and understand importance of monitoring SPO2 with pulse oximeter and demonstrate accurate use of the pulse oximeter.;To learn and understand importance of maintaining oxygen saturations>88%;To learn and demonstrate proper pursed lip breathing techniques or other breathing techniques.;To learn and demonstrate proper use of respiratory medications  To learn and exhibit compliance with exercise, home and travel O2 prescription;To learn and understand importance of monitoring SPO2 with pulse oximeter and demonstrate accurate use of the pulse oximeter.;To learn and understand importance of maintaining oxygen saturations>88%;To learn and demonstrate proper pursed lip breathing techniques or other breathing techniques.;To learn and  demonstrate proper use of respiratory medications  To learn and exhibit compliance with exercise, home and travel O2 prescription;To learn and understand importance of monitoring SPO2 with pulse oximeter and demonstrate accurate use of the pulse oximeter.;To learn and understand importance of maintaining oxygen saturations>88%;To learn and demonstrate proper pursed lip breathing techniques or other breathing techniques.;To learn and demonstrate proper use of respiratory medications  To learn and exhibit compliance with exercise, home and travel O2 prescription;To learn and understand importance of monitoring SPO2 with pulse oximeter and demonstrate accurate use of the pulse oximeter.;To learn and understand importance of maintaining oxygen saturations>88%;To learn and demonstrate proper pursed lip breathing techniques or other breathing techniques.;To learn and demonstrate proper use of respiratory medications    Long  Term Goals  Exhibits compliance with exercise, home and travel O2 prescription;Verbalizes importance of monitoring SPO2 with pulse oximeter and return demonstration;Maintenance of O2 saturations>88%;Exhibits proper breathing techniques, such as pursed lip breathing or other method taught during program session;Compliance with respiratory medication;Demonstrates proper use of MDI's  Exhibits compliance with exercise, home and travel O2 prescription;Verbalizes importance of monitoring SPO2 with pulse oximeter and return demonstration;Maintenance of O2 saturations>88%;Exhibits proper breathing techniques, such as pursed lip breathing or other method taught during program session;Compliance with respiratory medication;Demonstrates proper use of MDI's  Exhibits compliance with exercise, home and travel O2 prescription;Verbalizes importance of monitoring SPO2 with pulse oximeter and return demonstration;Maintenance of O2 saturations>88%;Exhibits proper breathing techniques, such as pursed lip breathing or other method taught during program session;Compliance with respiratory medication;Demonstrates proper use of MDI's  Exhibits compliance with exercise, home and travel O2 prescription;Verbalizes importance of monitoring SPO2 with pulse oximeter and return demonstration;Maintenance of O2 saturations>88%;Exhibits proper breathing techniques, such as pursed lip breathing or other method taught during program session;Compliance with respiratory medication;Demonstrates proper use of MDI's  Exhibits compliance with exercise, home and travel O2 prescription;Verbalizes importance of monitoring SPO2 with pulse oximeter and return demonstration;Maintenance of O2 saturations>88%;Exhibits proper breathing techniques, such as pursed lip breathing or other method taught during program session;Compliance with respiratory medication;Demonstrates proper use of MDI's   Goals/Expected Outcomes  -  patient compliant with home  oxygen requirements and meets goals as stated above  patient compliant with home oxygen requirements and meets goals as stated above  patient compliant with home oxygen requirements and meets goals as stated above  patient compliant with home oxygen requirements and meets goals as stated above      Oxygen Discharge (Final Oxygen Re-Evaluation): Oxygen Re-Evaluation - 10/16/17 1410      Program Oxygen Prescription   Program Oxygen Prescription  Continuous;E-Tanks    Liters per minute  3      Home Oxygen   Home Oxygen Device  Portable Concentrator;Home Concentrator    Sleep Oxygen Prescription  Continuous    Liters per minute  2    Home Exercise Oxygen Prescription  Continuous    Liters per minute  3    Home at Rest Exercise Oxygen Prescription  Continuous    Liters per minute  2    Compliance with Home Oxygen Use  Yes      Goals/Expected Outcomes   Short Term Goals  To learn and exhibit compliance with exercise, home and travel O2 prescription;To learn and understand importance of monitoring SPO2 with pulse oximeter and demonstrate accurate use of the pulse oximeter.;To learn and understand importance of maintaining oxygen saturations>88%;To learn and demonstrate proper pursed lip breathing techniques or other breathing techniques.;To learn and demonstrate proper use of  respiratory medications    Long  Term Goals  Exhibits compliance with exercise, home and travel O2 prescription;Verbalizes importance of monitoring SPO2 with pulse oximeter and return demonstration;Maintenance of O2 saturations>88%;Exhibits proper breathing techniques, such as pursed lip breathing or other method taught during program session;Compliance with respiratory medication;Demonstrates proper use of MDI's    Goals/Expected Outcomes  patient compliant with home oxygen requirements and meets goals as stated above       Initial Exercise Prescription: Initial Exercise Prescription - 06/26/17 1600      Date of  Initial Exercise RX and Referring Provider   Date  06/26/17    Referring Provider  Dr. Melvyn Novas      Oxygen   Oxygen  Continuous    Liters  3      Bike   Level  0.5    Minutes  17      NuStep   Level  2    Minutes  17    METs  1.4      Track   Laps  5    Minutes  17      Prescription Details   Frequency (times per week)  2    Duration  Progress to 45 minutes of aerobic exercise without signs/symptoms of physical distress      Intensity   THRR 40-80% of Max Heartrate  65-131    Ratings of Perceived Exertion  11-13    Perceived Dyspnea  0-4      Progression   Progression  Continue progressive overload as per policy without signs/symptoms or physical distress.      Resistance Training   Training Prescription  Yes    Weight  blue bands    Reps  10-15       Perform Capillary Blood Glucose checks as needed.  Exercise Prescription Changes: Exercise Prescription Changes    Row Name 07/03/17 1200 07/05/17 1200 07/10/17 1200 07/12/17 1200 07/24/17 1200     Response to Exercise   Blood Pressure (Admit)  118/68  138/80  110/68  132/60  122/62   Blood Pressure (Exercise)  126/72  134/84  132/68  130/70  126/82   Blood Pressure (Exit)  114/70  110/70  104/60  116/70  102/64   Heart Rate (Admit)  72 bpm  65 bpm  78 bpm  67 bpm  78 bpm   Heart Rate (Exercise)  104 bpm  104 bpm  108 bpm  95 bpm  98 bpm   Heart Rate (Exit)  75 bpm  80 bpm  71 bpm  67 bpm  85 bpm   Oxygen Saturation (Admit)  92 %  96 %  98 %  98 %  100 %   Oxygen Saturation (Exercise)  89 %  90 %  87 %  89 %  96 %   Oxygen Saturation (Exit)  99 %  98 %  98 %  99 %  97 %   Rating of Perceived Exertion (Exercise)  _0 Perceived Dyspnea (Exercise)  _1 Duration  Progress to 45 minutes of aerobic exercise without signs/symptoms of physical distress  Progress to 45 minutes of aerobic exercise without signs/symptoms of physical distress  Progress to 45 minutes of aerobic exercise without  signs/symptoms of physical distress  Progress to 45 minutes of aerobic exercise without signs/symptoms of physical distress  Progress to 45 minutes of aerobic  exercise without signs/symptoms of physical distress   Intensity  Other (comment) 40-80% of HRR  Other (comment) 40-80% of HRR  Other (comment) 40-80% of HRR  THRR unchanged  THRR unchanged     Progression   Progression  Continue to progress workloads to maintain intensity without signs/symptoms of physical distress.  Continue to progress workloads to maintain intensity without signs/symptoms of physical distress.  Continue to progress workloads to maintain intensity without signs/symptoms of physical distress.  Continue to progress workloads to maintain intensity without signs/symptoms of physical distress.  Continue to progress workloads to maintain intensity without signs/symptoms of physical distress.     Resistance Training   Training Prescription  Yes  Yes  Yes  Yes  Yes   Weight  blue bands  blue bands  blue bands  blue bands  blue bands   Reps  10-15  10-15  10-15  10-15  10-15   Time  10 Minutes  10 Minutes  10 Minutes  10 Minutes  10 Minutes     Oxygen   Oxygen  Continuous  Continuous  Continuous  Continuous  Continuous   Liters  _0 Bike   Level  0.5  -  0.5  0.5  0.5   Minutes  17  -  _1 NuStep   Level  _2 -  4   Minutes  _3 -  17   METs  1.9  2.1  2.1  -  2.1     Track   Laps  _4 -   Minutes  _5 -   Row Name 07/26/17 1200 07/31/17 1600 08/02/17 1200 08/07/17 1200 08/09/17 1200     Response to Exercise   Blood Pressure (Admit)  146/76  102/70  122/76  116/62  116/64   Blood Pressure (Exercise)  150/76  136/74  130/64  122/82  108/70   Blood Pressure (Exit)  122/80  120/70  112/70  114/75  104/68   Heart Rate (Admit)  78 bpm  85 bpm  80 bpm  77 bpm  77 bpm   Heart Rate (Exercise)  107 bpm  93 bpm  110 bpm  105 bpm  94 bpm   Heart Rate (Exit)  90 bpm   82 bpm  81 bpm  81 bpm  73 bpm   Oxygen Saturation (Admit)  93 %  96 %  96 %  95 %  97 %   Oxygen Saturation (Exercise)  88 %  88 %  91 %  90 %  93 %   Oxygen Saturation (Exit)  97 %  97 %  98 %  97 %  98 %   Rating of Perceived Exertion (Exercise)  _6 Perceived Dyspnea (Exercise)  _7 Duration  Progress to 45 minutes of aerobic exercise without signs/symptoms of physical distress  Progress to 45 minutes of aerobic exercise without signs/symptoms of physical distress  Progress to 45 minutes of aerobic exercise without signs/symptoms of physical distress  Progress to 45 minutes of aerobic exercise without signs/symptoms of physical distress  Progress to 45 minutes of aerobic exercise without signs/symptoms of physical distress  Intensity  THRR unchanged  THRR unchanged  THRR unchanged  THRR unchanged  THRR unchanged     Progression   Progression  Continue to progress workloads to maintain intensity without signs/symptoms of physical distress.  Continue to progress workloads to maintain intensity without signs/symptoms of physical distress.  Continue to progress workloads to maintain intensity without signs/symptoms of physical distress.  Continue to progress workloads to maintain intensity without signs/symptoms of physical distress.  Continue to progress workloads to maintain intensity without signs/symptoms of physical distress.     Resistance Training   Training Prescription  Yes  Yes  Yes  Yes  Yes   Weight  blue bands  b;ue bands  b;ue bands  b;ue bands  b;ue bands   Reps  10-15  10-15  10-15  10-15  10-15   Time  10 Minutes  10 Minutes  10 Minutes  10 Minutes  10 Minutes     Oxygen   Oxygen  Continuous  -  Continuous  Continuous  Continuous   Liters  3  -  _0 Bike   Level  0.5  0.5  0.5  0.5  -   Minutes  _1 -     NuStep   Level  4  4  -  5  5   Minutes  17  17  -  17  17   METs  1.9  1.9  -  2.1  2     Track   Laps  9  -  _2 Minutes  17  -  _3 Row Name 08/14/17 1200 08/21/17 1300 08/23/17 1200 08/28/17 1200 08/30/17 1200     Response to Exercise   Blood Pressure (Admit)  118/62  142/84  118/80  122/74  116/66   Blood Pressure (Exercise)  138/80  142/84  102/60  112/70  120/70   Blood Pressure (Exit)  100/60  108/72  125/77  106/64  104/70   Heart Rate (Admit)  84 bpm  75 bpm  80 bpm  80 bpm  83 bpm   Heart Rate (Exercise)  102 bpm  103 bpm  104 bpm  111 bpm  99 bpm   Heart Rate (Exit)  89 bpm  81 bpm  88 bpm  90 bpm  88 bpm   Oxygen Saturation (Admit)  94 %  93 %  96 %  95 %  97 %   Oxygen Saturation (Exercise)  89 %  92 %  87 %  92 %  93 %   Oxygen Saturation (Exit)  98 %  99 %  96 %  98 %  99 %   Rating of Perceived Exertion (Exercise)  _4 Perceived Dyspnea (Exercise)  _5 Duration  Progress to 45 minutes of aerobic exercise without signs/symptoms of physical distress  Progress to 45 minutes of aerobic exercise without signs/symptoms of physical distress  Progress to 45 minutes of aerobic exercise without signs/symptoms of physical distress  Progress to 45 minutes of aerobic exercise without signs/symptoms of physical distress  Progress to 45 minutes of aerobic exercise without signs/symptoms of physical distress   Intensity  THRR unchanged  THRR unchanged  THRR unchanged  THRR unchanged  THRR unchanged  Progression   Progression  Continue to progress workloads to maintain intensity without signs/symptoms of physical distress.  Continue to progress workloads to maintain intensity without signs/symptoms of physical distress.  Continue to progress workloads to maintain intensity without signs/symptoms of physical distress.  Continue to progress workloads to maintain intensity without signs/symptoms of physical distress.  Continue to progress workloads to maintain intensity without signs/symptoms of physical distress.     Resistance Training   Training Prescription   Yes  Yes  Yes  Yes  Yes   Weight  b;ue bands  b;ue bands  b;ue bands  b;ue bands  blue bands   Reps  10-15  10-15  10-15  10-15  10-15   Time  10 Minutes  10 Minutes  10 Minutes  10 Minutes  10 Minutes     Oxygen   Oxygen  Continuous  Continuous  Continuous  Continuous  Continuous   Liters  _0 Bike   Level  0.5  -  0.5  0.8  0.8   Minutes  17  -  _1 NuStep   Level  _2 Minutes  _3 METs  2  1.9  2.1  1.9  1.8     Track   Laps  _4 -   Minutes  _5 -     Home Exercise Plan   Plans to continue exercise at  Home (comment)  -  -  -  -   Frequency  Add 3 additional days to program exercise sessions.  -  -  -  -   Row Name 09/04/17 1100 09/06/17 1200 10/09/17 1200 10/11/17 1300       Response to Exercise   Blood Pressure (Admit)  102/60  118/60  112/64  104/66    Blood Pressure (Exercise)  104/70  120/76  96/64  128/64    Blood Pressure (Exit)  114/60  114/60  110/60  108/64    Heart Rate (Admit)  87 bpm  84 bpm  72 bpm  71 bpm    Heart Rate (Exercise)  104 bpm  110 bpm  112 bpm  93 bpm    Heart Rate (Exit)  83 bpm  86 bpm  81 bpm  69 bpm    Oxygen Saturation (Admit)  96 %  95 %  94 %  96 %    Oxygen Saturation (Exercise)  90 %  92 %  88 %  93 %    Oxygen Saturation (Exit)  98 %  97 %  99 %  95 %    Rating of Perceived Exertion (Exercise)  _6 Perceived Dyspnea (Exercise)  _7 Duration  Progress to 45 minutes of aerobic exercise without signs/symptoms of physical distress  Progress to 45 minutes of aerobic exercise without signs/symptoms of physical distress  Progress to 45 minutes of aerobic exercise without signs/symptoms of physical distress  Progress to 45 minutes of aerobic exercise without signs/symptoms of physical distress    Intensity  THRR unchanged  THRR unchanged  THRR unchanged  THRR unchanged      Progression  Progression  Continue to progress workloads to  maintain intensity without signs/symptoms of physical distress.  Continue to progress workloads to maintain intensity without signs/symptoms of physical distress.  Continue to progress workloads to maintain intensity without signs/symptoms of physical distress.  Continue to progress workloads to maintain intensity without signs/symptoms of physical distress.      Resistance Training   Training Prescription  Yes  Yes  Yes  Yes    Weight  blue bands  blue bands  blue bands  blue bands    Reps  10-15  10-15  10-15  10-15    Time  10 Minutes  10 Minutes  10 Minutes  10 Minutes      Oxygen   Oxygen  Continuous  Continuous  Continuous  Continuous    Liters  _0 2-3      Bike   Level  1  1  0.8  0.8    Minutes  _1 NuStep   Level  6  -  6  6    Minutes  17  -  17  17    METs  1.9  -  2.3  2.1      Track   Laps  _2 -    Minutes  _3 -       Exercise Comments: Exercise Comments    Row Name 08/14/17 1651           Exercise Comments  home exercise completed          Exercise Goals and Review: Exercise Goals    [place] Name 06/18/17 1420             Exercise Goals   Increase Physical Activity  Yes       Intervention  Provide advice, education, support and counseling about physical activity/exercise needs.;Develop an individualized exercise prescription for aerobic and resistive training based on initial evaluation findings, risk stratification, comorbidities and participant's personal goals.       Expected Outcomes  Achievement of increased cardiorespiratory fitness and enhanced flexibility, muscular endurance and strength shown through measurements of functional capacity and personal statement of participant.       Increase Strength and Stamina  Yes       Intervention  Provide advice, education, support and counseling about physical activity/exercise needs.;Develop an individualized exercise prescription for aerobic and resistive training  based on initial evaluation findings, risk stratification, comorbidities and participant's personal goals.       Expected Outcomes  Achievement of increased cardiorespiratory fitness and enhanced flexibility, muscular endurance and strength shown through measurements of functional capacity and personal statement of participant.          Exercise Goals Re-Evaluation : Exercise Goals Re-Evaluation    Row Name 07/10/17 4967 07/30/17 1214 09/03/17 0803 09/27/17 1636 10/16/17 0752     Exercise Goal Re-Evaluation   Exercise Goals Review  Increase Physical Activity;Increase Strength and Stamina  Increase Physical Activity;Increase Strength and Stamina;Able to understand and use Dyspnea scale;Able to understand and use rate of perceived exertion (RPE) scale;Knowledge and understanding of Target Heart Rate Range (THRR);Understanding of Exercise Prescription  Increase Physical Activity;Increase Strength and Stamina;Able to understand and use Dyspnea scale;Able to understand and use rate of perceived exertion (RPE) scale;Knowledge and understanding of Target Heart Rate Range (THRR);Understanding of Exercise Prescription  Increase Strength and Stamina;Increase Physical  Activity;Able to understand and use Dyspnea scale;Able to understand and use rate of perceived exertion (RPE) scale;Knowledge and understanding of Target Heart Rate Range (THRR);Understanding of Exercise Prescription  Increase Strength and Stamina;Increase Physical Activity;Able to understand and use Dyspnea scale;Able to understand and use rate of perceived exertion (RPE) scale;Knowledge and understanding of Target Heart Rate Range (THRR);Understanding of Exercise Prescription   Comments  Patient has only attended two exercise sessions. Will cont. to monitor and progress as able.   Patient is slowly progressing in program. Very excited when lost 1.1 pounds during last session. Very proud of her achievement. States that she is watching her diet. Will  complete home exercise.    Patient is progressing well in program. Attendance weighs heavily on getting a ride to rehab. Very motivated to make changes. Is exercising at home. Will cont. to monitor and progress as able.   Patient has been out due to chest pain. Performed stress test-all was normal. Patient will return to rehab next week.   Patient has been approved to come back to rehab post stress test. Patient has attended two sessions. Is showing initiative and motivation. Will cont. to monitor and progress as able.    Expected Outcomes  Through exercise and education at rehab and at home, patient will increase strength and stamina. Patient will also gain a better understanding of the need for physical activity on a daily basis and the effects it can have on quality of life.  Through exercise and education at rehab and at home, patient will increase strength and stamina. Patient will also gain a better understanding of the need for physical activity on a daily basis and the effects it can have on quality of life.  Through exercise and education at rehab and at home, patient will increase strength and stamina. Patient will also gain a better understanding of the need for physical activity on a daily basis and the effects it can have on quality of life.  Through exercise and education at rehab and at home, patient will increase strength and stamina. Patient will also gain a better understanding of the need for physical activity on a daily basis and the effects it can have on quality of life.  Through exercise and education at rehab and at home, patient will increase strength and stamina. Patient will also gain a better understanding of the need for physical activity on a daily basis and the effects it can have on quality of life.      Discharge Exercise Prescription (Final Exercise Prescription Changes): Exercise Prescription Changes - 10/11/17 1300      Response to Exercise   Blood Pressure (Admit)   104/66    Blood Pressure (Exercise)  128/64    Blood Pressure (Exit)  108/64    Heart Rate (Admit)  71 bpm    Heart Rate (Exercise)  93 bpm    Heart Rate (Exit)  69 bpm    Oxygen Saturation (Admit)  96 %    Oxygen Saturation (Exercise)  93 %    Oxygen Saturation (Exit)  95 %    Rating of Perceived Exertion (Exercise)  12    Perceived Dyspnea (Exercise)  1    Duration  Progress to 45 minutes of aerobic exercise without signs/symptoms of physical distress    Intensity  THRR unchanged      Progression   Progression  Continue to progress workloads to maintain intensity without signs/symptoms of physical distress.      Resistance Training  Training Prescription  Yes    Weight  blue bands    Reps  10-15    Time  10 Minutes      Oxygen   Oxygen  Continuous    Liters  2-3      Bike   Level  0.8    Minutes  17      NuStep   Level  6    Minutes  17    METs  2.1       Nutrition:  Target Goals: Understanding of nutrition guidelines, daily intake of sodium <1569m, cholesterol <2057m calories 30% from fat and 7% or less from saturated fats, daily to have 5 or more servings of fruits and vegetables.  Biometrics: Pre Biometrics - 06/18/17 1447      Pre Biometrics   Grip Strength  29 kg        Nutrition Therapy Plan and Nutrition Goals: Nutrition Therapy & Goals - 07/09/17 1515      Nutrition Therapy   Diet  Therapeutic Lifestyle Changes      Personal Nutrition Goals   Nutrition Goal  Identify food quantities necessary to achieve wt loss of 1-2 lb per week to a goal wt loss of 2.7-10.9 kg (6-24 lb) at graduation from pulmonary rehab.    Personal Goal #2  Describe the benefit of including fruits, vegetables, whole grains, and low-fat dairy products in a healthy meal plan.      Intervention Plan   Intervention  Prescribe, educate and counsel regarding individualized specific dietary modifications aiming towards targeted core components such as weight, hypertension, lipid  management, diabetes, heart failure and other comorbidities.    Expected Outcomes  Short Term Goal: Understand basic principles of dietary content, such as calories, fat, sodium, cholesterol and nutrients.;Long Term Goal: Adherence to prescribed nutrition plan.       Nutrition Discharge: Rate Your Plate Scores: Nutrition Assessments - 07/09/17 1515      Rate Your Plate Scores   Pre Score  52       Nutrition Goals Re-Evaluation:   Nutrition Goals Discharge (Final Nutrition Goals Re-Evaluation):   Psychosocial: Target Goals: Acknowledge presence or absence of significant depression and/or stress, maximize coping skills, provide positive support system. Participant is able to verbalize types and ability to use techniques and skills needed for reducing stress and depression.  Initial Review & Psychosocial Screening: Initial Psych Review & Screening - 06/18/17 1438      Initial Review   Current issues with  Current Sleep Concerns sleep deprevation from hot flashes      Family Dynamics   Good Support System?  Yes    Comments  recent loss of mother      Barriers   Psychosocial barriers to participate in program  There are no identifiable barriers or psychosocial needs.      Screening Interventions   Interventions  Encouraged to exercise       Quality of Life Scores:   PHQ-9: Recent Review Flowsheet Data    [facility] 06/18/2017 04/12/2017 03/29/2017 03/13/2017 02/05/2017   Decreased Interest 0 0 0 0 0   Down, Depressed, Hopeless 0 0 0 0 0   PHQ - 2 Score 0 0 0 0 0     Interpretation of Total Score  Total Score Depression Severity:  1-4 = Minimal depression, 5-9 = Mild depression, 10-14 = Moderate depression, 15-19 = Moderately severe depression, 20-27 = Severe depression   Psychosocial Evaluation and Intervention: Psychosocial Evaluation -  06/18/17 1440      Psychosocial Evaluation & Interventions   Interventions  Encouraged to exercise with the program and  follow exercise prescription    Expected Outcomes  patient will remain free from psychosocial barriers    Continue Psychosocial Services   Follow up required by staff       Psychosocial Re-Evaluation: Psychosocial Re-Evaluation    [place] Name 07/11/17 1716 08/03/17 1153 08/31/17 1209 09/24/17 1210 10/16/17 1414     Psychosocial Re-Evaluation   Current issues with  Current Sleep Concerns  Current Sleep Concerns  None Identified  None Identified  None Identified   Comments  recent loss of mother  recent loss of mother  sleep concerns have resolved  -  sleep concerns have resolved   Expected Outcomes  patient will remain free from psychosocial barriers to participation in pulmonary rehab  patient will remain free from psychosocial barriers to participation in pulmonary rehab  patient will remain free from psychosocial barriers to participation in pulmonary rehab  patient will remain free from psychosocial barriers to participation in pulmonary rehab  patient will remain free from psychosocial barriers to participation in pulmonary rehab   Interventions  Encouraged to attend Pulmonary Rehabilitation for the exercise  Encouraged to attend Pulmonary Rehabilitation for the exercise  Encouraged to attend Pulmonary Rehabilitation for the exercise  Encouraged to attend Pulmonary Rehabilitation for the exercise  Encouraged to attend Pulmonary Rehabilitation for the exercise   Continue Psychosocial Services   Follow up required by staff  Follow up required by staff  Follow up required by staff  Follow up required by staff  Follow up required by staff      Psychosocial Discharge (Final Psychosocial Re-Evaluation): Psychosocial Re-Evaluation - 10/16/17 1414      Psychosocial Re-Evaluation   Current issues with  None Identified    Comments  sleep concerns have resolved    Expected Outcomes  patient will remain free from psychosocial barriers to participation in pulmonary rehab    Interventions  Encouraged to  attend Pulmonary Rehabilitation for the exercise    Continue Psychosocial Services   Follow up required by staff       Education: Education Goals: Education classes will be provided on a weekly basis, covering required topics. Participant will state understanding/return demonstration of topics presented.  Learning Barriers/Preferences: Learning Barriers/Preferences - 06/18/17 1436      Learning Barriers/Preferences   Learning Barriers  None    Learning Preferences  Computer/Internet;Group Instruction;Individual Instruction;Verbal Instruction;Written Material       Education Topics: Risk Factor Reduction:  -Group instruction that is supported by a PowerPoint presentation. Instructor discusses the definition of a risk factor, different risk factors for pulmonary disease, and how the heart and lungs work together.     Nutrition for Pulmonary Patient:  -Group instruction provided by PowerPoint slides, verbal discussion, and written materials to support subject matter. The instructor gives an explanation and review of healthy diet recommendations, which includes a discussion on weight management, recommendations for fruit and vegetable consumption, as well as protein, fluid, caffeine, fiber, sodium, sugar, and alcohol. Tips for eating when patients are short of breath are discussed.   PULMONARY REHAB OTHER RESPIRATORY from 10/11/2017 in [place]  Date  08/30/17  Educator  Nutritionist  Instruction Review Code  R- Review/reinforce      Pursed Lip Breathing:  -Group instruction that is supported by demonstration and informational handouts. Instructor discusses the benefits of pursed lip and diaphragmatic breathing  and detailed demonstration on how to preform both.     Oxygen Safety:  -Group instruction provided by PowerPoint, verbal discussion, and written material to support subject matter. There is an overview of "What is Oxygen" and "Why do we need  it".  Instructor also reviews how to create a safe environment for oxygen use, the importance of using oxygen as prescribed, and the risks of noncompliance. There is a brief discussion on traveling with oxygen and resources the patient may utilize.   PULMONARY REHAB OTHER RESPIRATORY from 10/11/2017 in [place]  Date  09/06/17  Educator  Truddie Crumble  Instruction Review Code  2- meets goals/outcomes      Oxygen Equipment:  -Group instruction provided by [employer] Staff utilizing handouts, written materials, and equipment demonstrations.   PULMONARY REHAB OTHER RESPIRATORY from 10/11/2017 in [place]  Date  10/11/17  Educator  [facility]  Instruction Review Code  2- meets goals/outcomes      Signs and Symptoms:  -Group instruction provided by written material and verbal discussion to support subject matter. Warning signs and symptoms of infection, stroke, and heart attack are reviewed and when to call the physician/911 reinforced. Tips for preventing the spread of infection discussed.   PULMONARY REHAB OTHER RESPIRATORY from 10/11/2017 in [place]  Date  08/02/17  Educator  RN  Instruction Review Code  2- meets goals/outcomes      Advanced Directives:  -Group instruction provided by verbal instruction and written material to support subject matter. Instructor reviews Advanced Directive laws and proper instruction for filling out document.   Pulmonary Video:  -Group video education that reviews the importance of medication and oxygen compliance, exercise, good nutrition, pulmonary hygiene, and pursed lip and diaphragmatic breathing for the pulmonary patient.   PULMONARY REHAB OTHER RESPIRATORY from 10/11/2017 in [place]  Date  08/09/17  Instruction Review Code  2- meets goals/outcomes      Exercise for the Pulmonary Patient:  -Group instruction  that is supported by a PowerPoint presentation. Instructor discusses benefits of exercise, core components of exercise, frequency, duration, and intensity of an exercise routine, importance of utilizing pulse oximetry during exercise, safety while exercising, and options of places to exercise outside of rehab.     PULMONARY REHAB OTHER RESPIRATORY from 10/11/2017 in [place]  Date  07/24/17  Educator  ep  Instruction Review Code  2- meets goals/outcomes      Pulmonary Medications:  -Verbally interactive group education provided by instructor with focus on inhaled medications and proper administration.   PULMONARY REHAB OTHER RESPIRATORY from 10/11/2017 in [place]  Date  07/12/17  Educator  Pharmacist  Instruction Review Code  2- meets goals/outcomes      Anatomy and Physiology of the Respiratory System and Intimacy:  -Group instruction provided by PowerPoint, verbal discussion, and written material to support subject matter. Instructor reviews respiratory cycle and anatomical components of the respiratory system and their functions. Instructor also reviews differences in obstructive and restrictive respiratory diseases with examples of each. Intimacy, Sex, and Sexuality differences are reviewed with a discussion on how relationships can change when diagnosed with pulmonary disease. Common sexual concerns are reviewed.   MD DAY -A group question and answer session with a medical doctor that allows participants to ask questions that relate to their pulmonary disease state.   PULMONARY REHAB OTHER RESPIRATORY from  10/11/2017 in [place]  Date  08/21/17  Educator  yacoub  Instruction Review Code  2- meets goals/outcomes      OTHER EDUCATION -Group or individual verbal, written, or video instructions that support the educational goals of the pulmonary rehab program.   Knowledge  Questionnaire Score:   Core Components/Risk Factors/Patient Goals at Admission: Personal Goals and Risk Factors at Admission - 06/18/17 1436      Core Components/Risk Factors/Patient Goals on Admission    Weight Management  Obesity;Yes    Intervention  Weight Management: Develop a combined nutrition and exercise program designed to reach desired caloric intake, while maintaining appropriate intake of nutrient and fiber, sodium and fats, and appropriate energy expenditure required for the weight goal.;Weight Management/Obesity: Establish reasonable short term and long term weight goals.;Weight Management: Provide education and appropriate resources to help participant work on and attain dietary goals.;Obesity: Provide education and appropriate resources to help participant work on and attain dietary goals.    Expected Outcomes  Short Term: Continue to assess and modify interventions until short term weight is achieved;Long Term: Adherence to nutrition and physical activity/exercise program aimed toward attainment of established weight goal;Understanding of distribution of calorie intake throughout the day with the consumption of 4-5 meals/snacks;Understanding recommendations for meals to include 15-35% energy as protein, 25-35% energy from fat, 35-60% energy from carbohydrates, less than '200mg'$  of dietary cholesterol, 20-35 gm of total fiber daily;Weight Loss: Understanding of general recommendations for a balanced deficit meal plan, which promotes 1-2 lb weight loss per week and includes a negative energy balance of [phone] kcal/d    Improve shortness of breath with ADL's  Yes    Intervention  Provide education, individualized exercise plan and daily activity instruction to help decrease symptoms of SOB with activities of daily living.    Expected Outcomes  Short Term: Achieves a reduction of symptoms when performing activities of daily living.    Develop more efficient breathing techniques such as  purse lipped breathing and diaphragmatic breathing; and practicing self-pacing with activity  Yes    Intervention  Provide education, demonstration and support about specific breathing techniuqes utilized for more efficient breathing. Include techniques such as pursed lipped breathing, diaphragmatic breathing and self-pacing activity.    Expected Outcomes  Short Term: Participant will be able to demonstrate and use breathing techniques as needed throughout daily activities.    Increase knowledge of respiratory medications and ability to use respiratory devices properly   Yes    Intervention  Provide education and demonstration as needed of appropriate use of medications, inhalers, and oxygen therapy.    Expected Outcomes  Short Term: Achieves understanding of medications use. Understands that oxygen is a medication prescribed by physician. Demonstrates appropriate use of inhaler and oxygen therapy.       Core Components/Risk Factors/Patient Goals Review:  Goals and Risk Factor Review    Row Name 07/11/17 1715 08/03/17 1152 08/31/17 1207 09/24/17 1208 10/16/17 1411     Core Components/Risk Factors/Patient Goals Review   Personal Goals Review  Weight Management/Obesity;Develop more efficient breathing techniques such as purse lipped breathing and diaphragmatic breathing and practicing self-pacing with activity.;Improve shortness of breath with ADL's  Weight Management/Obesity;Develop more efficient breathing techniques such as purse lipped breathing and diaphragmatic breathing and practicing self-pacing with activity.;Improve shortness of breath with ADL's  Weight Management/Obesity;Develop more efficient breathing techniques such as purse lipped breathing and diaphragmatic breathing and practicing self-pacing with activity.;Improve shortness of breath with ADL's  Weight  Management/Obesity;Develop more efficient breathing techniques such as purse lipped breathing and diaphragmatic breathing and  practicing self-pacing with activity.;Improve shortness of breath with ADL's  Weight Management/Obesity;Develop more efficient breathing techniques such as purse lipped breathing and diaphragmatic breathing and practicing self-pacing with activity.;Improve shortness of breath with ADL's   Review  Patient has attended 3 sessions since admission. She works hard and is tolerating small workload increases. it is too soon to evaluate progress towards goals. Will re-eval in 30 days  patient continues to work hard in pulmonary rehab. she has lost 1.1 kg at her last visit. she is watching her caloric intake and making smarter food choices. she is tolerating small workload increases and states she really enjoys coming to pulmonary rehab.  patient continues to progress in pulmonary rehab. Her weight remains stable but she is not gaining weight. She continues to tolerate workload increases.  patient was doing well in pulmonary rehab until she was placed on medical hold for chest discomfort. She is scheduled for a stress test on 11/12 and depending on results, medical hold will be removed.  patient has only attended 2 session since she has returned from medical hold. she was able to return at the same workload levels as her last session. She is still able to maintain and RPE of 11-13. She continues to use PLB. She was able to maintain the weightloss she had before her medical hold. will evaluate progression towards an improvement in her SOB over the next 30 days.   Expected Outcomes  see admission expected outcomes  see admission expected outcomes  see admission expected outcomes  see admission expected outcomes  see admission expected outcomes      Core Components/Risk Factors/Patient Goals at Discharge (Final Review):  Goals and Risk Factor Review - 10/16/17 1411      Core Components/Risk Factors/Patient Goals Review   Personal Goals Review  Weight Management/Obesity;Develop more efficient breathing techniques such  as purse lipped breathing and diaphragmatic breathing and practicing self-pacing with activity.;Improve shortness of breath with ADL's    Review  patient has only attended 2 session since she has returned from medical hold. she was able to return at the same workload levels as her last session. She is still able to maintain and RPE of 11-13. She continues to use PLB. She was able to maintain the weightloss she had before her medical hold. will evaluate progression towards an improvement in her SOB over the next 30 days.    Expected Outcomes  see admission expected outcomes       ITP Comments:   Comments: Patient has attended 20 visits since her admission.

## 2017-10-23 ENCOUNTER — Encounter (HOSPITAL_COMMUNITY): Payer: BLUE CROSS/BLUE SHIELD

## 2017-10-24 DIAGNOSIS — J449 Chronic obstructive pulmonary disease, unspecified: Secondary | ICD-10-CM | POA: Diagnosis not present

## 2017-10-24 DIAGNOSIS — J9611 Chronic respiratory failure with hypoxia: Secondary | ICD-10-CM | POA: Diagnosis not present

## 2017-10-25 ENCOUNTER — Encounter (HOSPITAL_COMMUNITY): Payer: BLUE CROSS/BLUE SHIELD

## 2017-10-29 ENCOUNTER — Encounter: Payer: Self-pay | Admitting: Adult Health

## 2017-10-29 ENCOUNTER — Ambulatory Visit: Payer: BLUE CROSS/BLUE SHIELD | Admitting: Adult Health

## 2017-10-29 DIAGNOSIS — J9611 Chronic respiratory failure with hypoxia: Secondary | ICD-10-CM

## 2017-10-29 DIAGNOSIS — D869 Sarcoidosis, unspecified: Secondary | ICD-10-CM | POA: Diagnosis not present

## 2017-10-29 DIAGNOSIS — J9612 Chronic respiratory failure with hypercapnia: Secondary | ICD-10-CM

## 2017-10-29 DIAGNOSIS — J449 Chronic obstructive pulmonary disease, unspecified: Secondary | ICD-10-CM | POA: Diagnosis not present

## 2017-10-29 NOTE — Assessment & Plan Note (Signed)
Recent flare with sarcoid now improving .  Patient's medications were reviewed today and patient education was given. Computerized medication calendar was adjusted/completed   Plan  Patient Instructions  Keep eye appointment this week as planned  Continue on Prednisone 5mg  daily  Will discuss Plaquenil on return  May add Mucinex Twice daily  As needed  Cough/congestion  Follow up Dr. Melvyn Novas  In 4 weeks and As needed   Please contact office for sooner follow up if symptoms do not improve or worsen or seek emergency care

## 2017-10-29 NOTE — Assessment & Plan Note (Signed)
Recent flare  Keep eye appointment  Discuss plaquenil on return .

## 2017-10-29 NOTE — Progress Notes (Signed)
@Patient  ID: [name], female    DOB: [birth year], 56 y.o.   MRN: [id number]  Chief Complaint  Patient presents with  . Follow-up    COPD     Referring provider: Caryl Never*  HPI: 56 yo female former smoker followed for sarcoid and GOLD III COPD , O2 dependent RF   TEST  transbronchial biopsy 5/92 with NCG inflammatory change consistent with sarcoid PFT's 06/26/08 FEV1 1.09 ratio  - PFTs 03/02/10 FEV1 1.06 ratio 34    PFT's 08/01/2011 FEV1 1.6 (42%)  41 ratio  DLCO 55 corrects 77  PFTs 10/10/16    FEV1  0.87(38)ratio 41 and DLCO 36/39c corrects to 51 for alv vol > placed back on daily pred  Spirometry 09/03/2017  FEV1 0.52 (23%)  Ratio 40 with typical curvature    10/29/2017 Follow up ; COPD /Sarcoid /O2 RF  Patient returns for a one-month follow-up.  Last visit was having a COPD/Sarcoid  exacerbation.  She was treated with Augmentin for 10 days along with prednisone burst.  Patient is on chronic prednisone.  Her prednisone was increased to 20 mg until better and then back to a baseline of 5 mg. Patient says she is feeling better with less cough . Still has some lingering congestion in am . Appetite is good. No increased wheezing . She is down to prednisone 5mg  .  She has eye appointment this week.  Patient remains on oxygen 2 L. She is in pulmonary rehab   Influenza vaccine is up-to-date  We reviewed all her meds and organized them into a med calendear with pt education .    Allergies  Allergen Reactions  . Codeine Other (See Comments)    Avoids-felt bad when took before    Immunization History  Administered Date(s) Administered  . Influenza Split 08/22/2012, 08/13/2014  . Influenza Whole 10/25/2007, 08/13/2010, 07/30/2011  . Influenza,inj,Quad PF,6+ Mos 07/30/2013, 09/20/2015, 08/10/2016, 07/17/2017  . Pneumococcal Polysaccharide-23 11/14/2007  . Td 06/13/2004  . Tdap 06/15/2014    Past Medical History:  Diagnosis Date  . Allergic rhinitis   .  Breast cancer ([place]) 2016   right breast  . COPD, mild ([place]) FOLLOWED BY DR Melvyn Novas  . GERD (gastroesophageal reflux disease)   . HTN (hypertension)   . Hypertension   . Iron deficiency anemia   . Long-term current use of steroids SYMBICORT INHALER  . 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 Personal history of radiation therapy 2016  . Sarcoidosis STABLE PER CXR JUNE 2013  . Shortness of breath   . Sickle cell trait ([place])     Tobacco History: Social History   Tobacco Use  Smoking Status Former Smoker  . Packs/day: 1.00  . Years: 20.00  . Pack years: 20.00  . Last attempt to quit: 11/14/2003  . Years since quitting: 13.9  Smokeless Tobacco Never Used   Counseling given: Not Answered   Outpatient Encounter Medications as of 10/29/2017  Medication Sig  . acetaminophen (TYLENOL) 500 MG tablet Take 1,000 mg by mouth every 6 (six) hours as needed for fever. Reported on 05/10/2016  . albuterol (VENTOLIN HFA) 108 (90 Base) MCG/ACT inhaler Inhale 2 puffs into the lungs every 6 (six) hours as needed for wheezing or shortness of breath.  Marland Kitchen aspirin EC 81 MG tablet Take 81 mg by mouth daily.  Marland Kitchen atorvastatin (LIPITOR) 20 MG tablet Take 1 tablet (20 mg total) by mouth daily.  . fluticasone (FLONASE) 50 MCG/ACT nasal spray Place 2 sprays into  both nostrils daily as needed for allergies.  Marland Kitchen gabapentin (NEURONTIN) 300 MG capsule Take 1 capsule (300 mg total) by mouth at bedtime. (Patient taking differently: Take 600 mg by mouth at bedtime. )  . metoprolol tartrate (LOPRESSOR) 25 MG tablet Take 1 tablet (25 mg total) by mouth 2 (two) times daily.  . Multiple Vitamin (MULTIVITAMIN) capsule Take 1 capsule by mouth daily.  . Olopatadine HCl 0.2 % SOLN Apply 1 drop to eye daily.  Marland Kitchen omeprazole (PRILOSEC) 20 MG capsule TAKE 1 CAPSULE BY MOUTH ONCE DAILY  . ondansetron (ZOFRAN) 4 MG tablet Take 1 tablet (4 mg total) by mouth every 8 (eight) hours as needed for nausea or vomiting.  . OXYGEN 2lpm with rest and 3-4 lpm with  exertion  . predniSONE (DELTASONE) 5 MG tablet Take 5 mg by mouth every other day.   . predniSONE (STERAPRED UNI-PAK 21 TAB) 5 MG (21) TBPK tablet 20 mg daily until then 10 mg daily x 5 days then 5 mg daily  . sertraline (ZOLOFT) 50 MG tablet Take 1 tablet (50 mg total) by mouth daily.  . Tiotropium Bromide-Olodaterol (STIOLTO RESPIMAT) 2.5-2.5 MCG/ACT AERS Inhale 2 puffs into the lungs daily.  Marland Kitchen triamterene-hydrochlorothiazide (MAXZIDE-25) 37.5-25 MG tablet TAKE ONE TABLET BY MOUTH ONCE DAILY   No facility-administered encounter medications on file as of 10/29/2017.      Review of Systems  Constitutional:   No  weight loss, night sweats,  Fevers, chills,  +fatigue, or  lassitude.  HEENT:   No headaches,  Difficulty swallowing,  Tooth/dental problems, or  Sore throat,                No sneezing, itching, ear ache, nasal congestion, post nasal drip,   CV:  No chest pain,  Orthopnea, PND, swelling in lower extremities, anasarca, dizziness, palpitations, syncope.   GI  No heartburn, indigestion, abdominal pain, nausea, vomiting, diarrhea, change in bowel habits, loss of appetite, bloody stools.   Resp:    No chest wall deformity  Skin: no rash or lesions.  GU: no dysuria, change in color of urine, no urgency or frequency.  No flank pain, no hematuria   MS:  No joint pain or swelling.  No decreased range of motion.  No back pain.    Physical Exam  BP 114/70 (BP Location: Left Arm, Cuff Size: Normal)   Pulse 73   Ht 5\' 5"  (1.651 m)   Wt 208 lb 6.4 oz (94.5 kg)   SpO2 96%   BMI 34.68 kg/m   GEN: A/Ox3; pleasant , NAD, on O2    HEENT:  [place]/AT,  EACs-clear, TMs-wnl, NOSE-clear, THROAT-clear, no lesions, no postnasal drip or exudate noted.   NECK:  Supple w/ fair ROM; no JVD; normal carotid impulses w/o bruits; no thyromegaly or nodules palpated; no lymphadenopathy.    RESP  Decreased BS in bases , ,  no accessory muscle use, no dullness to percussion  CARD:  RRR, no m/r/g,  no peripheral edema, pulses intact, no cyanosis or clubbing.  GI:   Soft & nt; nml bowel sounds; no organomegaly or masses detected.   Musco: Warm bil, no deformities or joint swelling noted.   Neuro: alert, no focal deficits noted.    Skin: Warm, no lesions or rashes    Lab Results:  CBC    Imaging: No results found.   Assessment & Plan:   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  Recent flare with sarcoid now  improving .  Patient's medications were reviewed today and patient education was given. Computerized medication calendar was adjusted/completed   Plan  Patient Instructions  Keep eye appointment this week as planned  Continue on Prednisone 5mg  daily  Will discuss Plaquenil on return  May add Mucinex Twice daily  As needed  Cough/congestion  Follow up Dr. Melvyn Novas  In 4 weeks and As needed   Please contact office for sooner follow up if symptoms do not improve or worsen or seek emergency care      Chronic respiratory failure with hypoxia and hypercapnia ([place]) c ont on o2 .   Sarcoidosis ([place]) Recent flare  Keep eye appointment  Discuss plaquenil on return .      Rexene Edison, NP 10/29/2017

## 2017-10-29 NOTE — Assessment & Plan Note (Signed)
c ont on o2 .

## 2017-10-29 NOTE — Progress Notes (Signed)
Chart and office note reviewed in detail  > agree with a/p as outlined    

## 2017-10-29 NOTE — Patient Instructions (Signed)
Keep eye appointment this week as planned  Continue on Prednisone 5mg  daily  Will discuss Plaquenil on return  May add Mucinex Twice daily  As needed  Cough/congestion  Follow up Dr. Melvyn Novas  In 4 weeks and As needed   Please contact office for sooner follow up if symptoms do not improve or worsen or seek emergency care

## 2017-10-30 ENCOUNTER — Encounter (HOSPITAL_COMMUNITY): Payer: BLUE CROSS/BLUE SHIELD

## 2017-10-30 ENCOUNTER — Encounter (HOSPITAL_COMMUNITY)
Admission: RE | Admit: 2017-10-30 | Discharge: 2017-10-30 | Disposition: A | Discharge status: Home / Self Care | Payer: BLUE CROSS/BLUE SHIELD | Source: Ambulatory Visit | Attending: Internal Medicine | Admitting: Internal Medicine

## 2017-10-30 VITALS — Wt 209.2 lb

## 2017-10-30 DIAGNOSIS — R0609 Other forms of dyspnea: Secondary | ICD-10-CM | POA: Diagnosis not present

## 2017-10-30 DIAGNOSIS — D860 Sarcoidosis of lung: Secondary | ICD-10-CM

## 2017-10-30 NOTE — Progress Notes (Signed)
Daily Session Note  Patient Details  Name: [name] MRN: [id number] Date of Birth: 01-02-1961 Referring Provider:     Pulmonary Rehab Walk Test from 06/26/2017 in [facility]  Referring Provider  Dr. Melvyn Novas      Encounter Date: 10/30/2017  Check In: Session Check In - 10/30/17 1225      Check-In   Location  [facility]    Staff Present  Su Hilt, MS, ACSM RCEP, Exercise Physiologist;Joan Leonia Reeves, RN, BSN;Lisa Hughes, RN;Portia Rollene Rotunda, RN, BSN    Supervising physician immediately available to respond to emergencies  [facility] immediately available    Physician(s)  Dr. Lucianne Lei    Medication changes reported      No    Fall or balance concerns reported     No    Tobacco Cessation  No Change    Warm-up and Cool-down  Performed as group-led instruction    Resistance Training Performed  Yes    VAD Patient?  No      Pain Assessment   Currently in Pain?  No/denies    Multiple Pain Sites  No       Capillary Blood Glucose: No results found for this or any previous visit (from the past 24 hour(s)).  Exercise Prescription Changes - 10/30/17 1200      Response to Exercise   Blood Pressure (Admit)  110/66    Blood Pressure (Exercise)  100/62    Blood Pressure (Exit)  102/60    Heart Rate (Admit)  71 bpm    Heart Rate (Exercise)  93 bpm    Heart Rate (Exit)  67 bpm    Oxygen Saturation (Admit)  94 %    Oxygen Saturation (Exercise)  93 %    Oxygen Saturation (Exit)  96 %    Rating of Perceived Exertion (Exercise)  13    Perceived Dyspnea (Exercise)  3    Duration  Progress to 45 minutes of aerobic exercise without signs/symptoms of physical distress    Intensity  THRR unchanged      Progression   Progression  Continue to progress workloads to maintain intensity without signs/symptoms of physical distress.      Resistance Training   Training Prescription  Yes    Weight  blue bands    Reps  10-15    Time  10  Minutes      Oxygen   Oxygen  Continuous    Liters  2-3      Bike   Level  0.8    Minutes  17      NuStep   Level  7    Minutes  17    METs  2.1      Track   Laps  8    Minutes  17       Social History   Tobacco Use  Smoking Status Former Smoker  . Packs/day: 1.00  . Years: 20.00  . Pack years: 20.00  . Last attempt to quit: 11/14/2003  . Years since quitting: 13.9  Smokeless Tobacco Never Used    Goals Met:  Exercise tolerated well No report of cardiac concerns or symptoms Strength training completed today  Goals Unmet:  Not Applicable  Comments: Service time is from 10:30a to 12:15p    Dr. Rush Farmer is Medical Director for Pulmonary Rehab at [facility].

## 2017-10-30 NOTE — Addendum Note (Signed)
Addended by: Parke Poisson E on: 10/30/2017 04:57 PM   Modules accepted: Orders

## 2017-11-01 ENCOUNTER — Encounter (HOSPITAL_COMMUNITY)
Admission: RE | Admit: 2017-11-01 | Discharge: 2017-11-01 | Disposition: A | Discharge status: Home / Self Care | Payer: BLUE CROSS/BLUE SHIELD | Source: Ambulatory Visit

## 2017-11-01 ENCOUNTER — Encounter (HOSPITAL_COMMUNITY): Payer: BLUE CROSS/BLUE SHIELD

## 2017-11-08 ENCOUNTER — Encounter (HOSPITAL_COMMUNITY)
Admission: RE | Admit: 2017-11-08 | Discharge: 2017-11-08 | Disposition: A | Discharge status: Home / Self Care | Payer: BLUE CROSS/BLUE SHIELD | Source: Ambulatory Visit

## 2017-11-08 ENCOUNTER — Encounter (HOSPITAL_COMMUNITY): Payer: BLUE CROSS/BLUE SHIELD

## 2017-11-08 DIAGNOSIS — D860 Sarcoidosis of lung: Secondary | ICD-10-CM

## 2017-11-08 NOTE — Progress Notes (Signed)
Pulmonary Individual Treatment Plan  Patient Details  Name: [name] MRN: [id number] Date of Birth: 20-Sep-1961 Referring Provider:     Pulmonary Rehab Walk Test from 06/26/2017 in [facility]  Referring Provider  Dr. Melvyn Novas      Initial Encounter Date:    Pulmonary Rehab Walk Test from 06/26/2017 in [facility]  Date  06/26/17  Referring Provider  Dr. Melvyn Novas      Visit Diagnosis: Sarcoidosis of lung ([place])  Patient's Home Medications on Admission:   Current Outpatient Medications:  .  acetaminophen (TYLENOL) 500 MG tablet, Per bottle as needed, Disp: , Rfl:  .  albuterol (VENTOLIN HFA) 108 (90 Base) MCG/ACT inhaler, Inhale 2 puffs into the lungs every 6 (six) hours as needed for wheezing or shortness of breath., Disp: 1 Inhaler, Rfl: 2 .  anastrozole (ARIMIDEX) 1 MG tablet, Take 1 mg by mouth daily., Disp: , Rfl:  .  aspirin EC 81 MG tablet, Take 81 mg by mouth daily., Disp: , Rfl:  .  atorvastatin (LIPITOR) 20 MG tablet, Take 1 tablet (20 mg total) by mouth daily., Disp: 90 tablet, Rfl: 3 .  famotidine (PEPCID) 20 MG tablet, Take 20 mg by mouth at bedtime., Disp: , Rfl:  .  fluticasone (FLONASE) 50 MCG/ACT nasal spray, Place 2 sprays into both nostrils daily as needed for allergies., Disp: 16 g, Rfl: 11 .  gabapentin (NEURONTIN) 300 MG capsule, 2 at bedtime, Disp: , Rfl:  .  guaiFENesin (MUCINEX) 600 MG 12 hr tablet, Take 600 mg by mouth 2 (two) times daily as needed., Disp: , Rfl:  .  metoprolol tartrate (LOPRESSOR) 25 MG tablet, Take 1 tablet (25 mg total) by mouth 2 (two) times daily., Disp: 60 tablet, Rfl: 11 .  Multiple Vitamin (MULTIVITAMIN) capsule, Take 1 capsule by mouth daily., Disp: , Rfl:  .  omeprazole (PRILOSEC) 20 MG capsule, TAKE 1 CAPSULE BY MOUTH ONCE DAILY, Disp: 30 capsule, Rfl: 5 .  ondansetron (ZOFRAN) 4 MG tablet, Take 1 tablet (4 mg total) by mouth every 8 (eight) hours as needed for nausea or  vomiting. (Patient not taking: Reported on 10/30/2017), Disp: 10 tablet, Rfl: 0 .  OXYGEN, 2lpm with rest and 3-4 lpm with exertion, Disp: , Rfl:  .  predniSONE (DELTASONE) 5 MG tablet, Take 5 mg by mouth daily with breakfast. , Disp: , Rfl:  .  sertraline (ZOLOFT) 50 MG tablet, Take 1 tablet (50 mg total) by mouth daily., Disp: 90 tablet, Rfl: 0 .  Tiotropium Bromide-Olodaterol (STIOLTO RESPIMAT) 2.5-2.5 MCG/ACT AERS, Inhale 2 puffs into the lungs daily., Disp: 1 Inhaler, Rfl: 11 .  triamterene-hydrochlorothiazide (MAXZIDE-25) 37.5-25 MG tablet, TAKE ONE TABLET BY MOUTH ONCE DAILY, Disp: 30 tablet, Rfl: 11  Past Medical History: Past Medical History:  Diagnosis Date  . Allergic rhinitis   . Breast cancer ([place]) 2016   right breast  . COPD, mild ([place]) FOLLOWED BY DR Melvyn Novas  . GERD (gastroesophageal reflux disease)   . HTN (hypertension)   . Hypertension   . Iron deficiency anemia   . Long-term current use of steroids SYMBICORT INHALER  . 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 Personal history of radiation therapy 2016  . Sarcoidosis STABLE PER CXR JUNE 2013  . Shortness of breath   . Sickle cell trait ([facility])     Tobacco Use: Social History   Tobacco Use  Smoking Status Former Smoker  . Packs/day: 1.00  . Years: 20.00  .  Pack years: 20.00  . Last attempt to quit: 11/14/2003  . Years since quitting: 13.9  Smokeless Tobacco Never Used    Labs: Recent Review Flowsheet Data    Labs for ITP Cardiac and Pulmonary Rehab Latest Ref Rng & Units 12/20/2012 09/10/2013 02/04/2015 08/13/2015 02/05/2017   Cholestrol 100 - 199 mg/dL 187 - 178 - 247(H)   LDLCALC 0 - 99 mg/dL 106(H) - 118(H) - 131(H)   HDL >39 mg/dL 53 - 43(L) - 80   Trlycerides 0 - 149 mg/dL 139 - 84 - 178(H)   Hemoglobin A1c - - 5.9 5.9 - -   TCO2 0 - 100 mmol/L - - - 30 -      Capillary Blood Glucose: Lab Results  Component Value Date   GLUCAP 83 07/19/2012     Pulmonary Assessment Scores:   Pulmonary Function Assessment: Pulmonary  Function Assessment - 06/18/17 1436      Breath   Bilateral Breath Sounds  Clear;Decreased    Shortness of Breath  Yes;Limiting activity       Exercise Target Goals:    Exercise Program Goal: Individual exercise prescription set with THRR, safety & activity barriers. Participant demonstrates ability to understand and report RPE using BORG scale, to self-measure pulse accurately, and to acknowledge the importance of the exercise prescription.  Exercise Prescription Goal: Starting with aerobic activity 30 plus minutes a day, 3 days per week for initial exercise prescription. Provide home exercise prescription and guidelines that participant acknowledges understanding prior to discharge.  Activity Barriers & Risk Stratification:   6 Minute Walk: 6 Minute Walk    Row Name 06/26/17 1634         6 Minute Walk   Phase  Initial     Distance  1037 feet     Walk Time  5 minutes     # of Rest Breaks  1 1 minute     MPH  1.96     METS  2.53     RPE  11     Perceived Dyspnea   2     Symptoms  No     Resting HR  78 bpm     Resting BP  120/79     Max Ex. HR  118 bpm     Max Ex. BP  175/78     2 Minute Post BP  122/85       Interval HR   Baseline HR (retired)  78     1 Minute HR  97     2 Minute HR  106     3 Minute HR  86     4 Minute HR  101     5 Minute HR  118     6 Minute HR  118     2 Minute Post HR  88     Interval Heart Rate?  Yes       Interval Oxygen   Interval Oxygen?  Yes     Baseline Oxygen Saturation %  94 %     Resting Liters of Oxygen  2 L     1 Minute Oxygen Saturation %  91 %     1 Minute Liters of Oxygen  2 L     2 Minute Oxygen Saturation %  87 %     2 Minute Liters of Oxygen  2 L     3 Minute Oxygen Saturation %  93 %     3 Minute Liters  of Oxygen  3 L     4 Minute Oxygen Saturation %  92 %     4 Minute Liters of Oxygen  3 L     5 Minute Oxygen Saturation %  89 %     5 Minute Liters of Oxygen  3 L     6 Minute Oxygen Saturation %  89 %     6  Minute Liters of Oxygen  3 L     2 Minute Post Oxygen Saturation %  96 %     2 Minute Post Liters of Oxygen  3 L        Oxygen Initial Assessment: Oxygen Initial Assessment - 06/26/17 1633      Initial 6 min Walk   Oxygen Used  Continuous;E-Tanks    Liters per minute  3    Resting Oxygen Saturation   93 %    Exercise Oxygen Saturation  during 6 min walk  87 % 87 on 2 liters, titrated to 3 liters      Program Oxygen Prescription   Program Oxygen Prescription  Continuous;E-Tanks    Liters per minute  3       Oxygen Re-Evaluation: Oxygen Re-Evaluation    Row Name 07/11/17 1713 08/03/17 1150 08/31/17 1200 09/24/17 1207 10/16/17 1410     Program Oxygen Prescription   Program Oxygen Prescription  Continuous;E-Tanks  Continuous;E-Tanks  Continuous;E-Tanks  Continuous;E-Tanks  Continuous;E-Tanks   Liters per minute  _0 Home Oxygen   Home Oxygen Device  Portable Concentrator;Home Concentrator  Portable Concentrator;Home Concentrator  Portable Concentrator;Home Concentrator  Portable Concentrator;Home Concentrator  Portable Concentrator;Home Concentrator   Sleep Oxygen Prescription  Continuous  Continuous  Continuous  Continuous  Continuous   Liters per minute  _1 Home Exercise Oxygen Prescription  Continuous  Continuous  Continuous  Continuous  Continuous   Liters per minute  _2 Home at Rest Exercise Oxygen Prescription  Continuous  Continuous  Continuous  Continuous  Continuous   Liters per minute  _3 Compliance with Home Oxygen Use  Yes  Yes  Yes  Yes  Yes     Goals/Expected Outcomes   Short Term Goals  To learn and exhibit compliance with exercise, home and travel O2 prescription;To learn and understand importance of monitoring SPO2 with pulse oximeter and demonstrate accurate use of the pulse oximeter.;To learn and understand importance of maintaining oxygen saturations>88%;To learn and demonstrate proper pursed lip breathing  techniques or other breathing techniques.;To learn and demonstrate proper use of respiratory medications  To learn and exhibit compliance with exercise, home and travel O2 prescription;To learn and understand importance of monitoring SPO2 with pulse oximeter and demonstrate accurate use of the pulse oximeter.;To learn and understand importance of maintaining oxygen saturations>88%;To learn and demonstrate proper pursed lip breathing techniques or other breathing techniques.;To learn and demonstrate proper use of respiratory medications  To learn and exhibit compliance with exercise, home and travel O2 prescription;To learn and understand importance of monitoring SPO2 with pulse oximeter and demonstrate accurate use of the pulse oximeter.;To learn and understand importance of maintaining oxygen saturations>88%;To learn and demonstrate proper pursed lip breathing techniques or other breathing techniques.;To learn and demonstrate proper use of respiratory medications  To learn and exhibit compliance with exercise, home  and travel O2 prescription;To learn and understand importance of monitoring SPO2 with pulse oximeter and demonstrate accurate use of the pulse oximeter.;To learn and understand importance of maintaining oxygen saturations>88%;To learn and demonstrate proper pursed lip breathing techniques or other breathing techniques.;To learn and demonstrate proper use of respiratory medications  To learn and exhibit compliance with exercise, home and travel O2 prescription;To learn and understand importance of monitoring SPO2 with pulse oximeter and demonstrate accurate use of the pulse oximeter.;To learn and understand importance of maintaining oxygen saturations>88%;To learn and demonstrate proper pursed lip breathing techniques or other breathing techniques.;To learn and demonstrate proper use of respiratory medications   Long  Term Goals  Exhibits compliance with exercise, home and travel O2  prescription;Verbalizes importance of monitoring SPO2 with pulse oximeter and return demonstration;Maintenance of O2 saturations>88%;Exhibits proper breathing techniques, such as pursed lip breathing or other method taught during program session;Compliance with respiratory medication;Demonstrates proper use of MDI's  Exhibits compliance with exercise, home and travel O2 prescription;Verbalizes importance of monitoring SPO2 with pulse oximeter and return demonstration;Maintenance of O2 saturations>88%;Exhibits proper breathing techniques, such as pursed lip breathing or other method taught during program session;Compliance with respiratory medication;Demonstrates proper use of MDI's  Exhibits compliance with exercise, home and travel O2 prescription;Verbalizes importance of monitoring SPO2 with pulse oximeter and return demonstration;Maintenance of O2 saturations>88%;Exhibits proper breathing techniques, such as pursed lip breathing or other method taught during program session;Compliance with respiratory medication;Demonstrates proper use of MDI's  Exhibits compliance with exercise, home and travel O2 prescription;Verbalizes importance of monitoring SPO2 with pulse oximeter and return demonstration;Maintenance of O2 saturations>88%;Exhibits proper breathing techniques, such as pursed lip breathing or other method taught during program session;Compliance with respiratory medication;Demonstrates proper use of MDI's  Exhibits compliance with exercise, home and travel O2 prescription;Verbalizes importance of monitoring SPO2 with pulse oximeter and return demonstration;Maintenance of O2 saturations>88%;Exhibits proper breathing techniques, such as pursed lip breathing or other method taught during program session;Compliance with respiratory medication;Demonstrates proper use of MDI's   Goals/Expected Outcomes  -  patient compliant with home oxygen requirements and meets goals as stated above  patient compliant with  home oxygen requirements and meets goals as stated above  patient compliant with home oxygen requirements and meets goals as stated above  patient compliant with home oxygen requirements and meets goals as stated above   Row Name 11/08/17 0806             Program Oxygen Prescription   Program Oxygen Prescription  Continuous;E-Tanks       Liters per minute  3         Home Oxygen   Home Oxygen Device  Portable Concentrator;Home Concentrator       Sleep Oxygen Prescription  Continuous       Liters per minute  2       Home Exercise Oxygen Prescription  Continuous       Liters per minute  3       Home at Rest Exercise Oxygen Prescription  Continuous       Liters per minute  2       Compliance with Home Oxygen Use  Yes         Goals/Expected Outcomes   Short Term Goals  To learn and exhibit compliance with exercise, home and travel O2 prescription;To learn and understand importance of monitoring SPO2 with pulse oximeter and demonstrate accurate use of the pulse oximeter.;To learn and understand importance of maintaining oxygen saturations>88%;To learn and demonstrate proper pursed  lip breathing techniques or other breathing techniques.;To learn and demonstrate proper use of respiratory medications       Long  Term Goals  Exhibits compliance with exercise, home and travel O2 prescription;Verbalizes importance of monitoring SPO2 with pulse oximeter and return demonstration;Maintenance of O2 saturations>88%;Exhibits proper breathing techniques, such as pursed lip breathing or other method taught during program session;Compliance with respiratory medication;Demonstrates proper use of MDI's       Goals/Expected Outcomes  patient compliant with home oxygen requirements and meets goals as stated above          Oxygen Discharge (Final Oxygen Re-Evaluation): Oxygen Re-Evaluation - 11/08/17 0806      Program Oxygen Prescription   Program Oxygen Prescription  Continuous;E-Tanks    Liters per minute   3      Home Oxygen   Home Oxygen Device  Portable Concentrator;Home Concentrator    Sleep Oxygen Prescription  Continuous    Liters per minute  2    Home Exercise Oxygen Prescription  Continuous    Liters per minute  3    Home at Rest Exercise Oxygen Prescription  Continuous    Liters per minute  2    Compliance with Home Oxygen Use  Yes      Goals/Expected Outcomes   Short Term Goals  To learn and exhibit compliance with exercise, home and travel O2 prescription;To learn and understand importance of monitoring SPO2 with pulse oximeter and demonstrate accurate use of the pulse oximeter.;To learn and understand importance of maintaining oxygen saturations>88%;To learn and demonstrate proper pursed lip breathing techniques or other breathing techniques.;To learn and demonstrate proper use of respiratory medications    Long  Term Goals  Exhibits compliance with exercise, home and travel O2 prescription;Verbalizes importance of monitoring SPO2 with pulse oximeter and return demonstration;Maintenance of O2 saturations>88%;Exhibits proper breathing techniques, such as pursed lip breathing or other method taught during program session;Compliance with respiratory medication;Demonstrates proper use of MDI's    Goals/Expected Outcomes  patient compliant with home oxygen requirements and meets goals as stated above       Initial Exercise Prescription: Initial Exercise Prescription - 06/26/17 1600      Date of Initial Exercise RX and Referring Provider   Date  06/26/17    Referring Provider  Dr. Melvyn Novas      Oxygen   Oxygen  Continuous    Liters  3      Bike   Level  0.5    Minutes  17      NuStep   Level  2    Minutes  17    METs  1.4      Track   Laps  5    Minutes  17      Prescription Details   Frequency (times per week)  2    Duration  Progress to 45 minutes of aerobic exercise without signs/symptoms of physical distress      Intensity   THRR 40-80% of Max Heartrate  65-131     Ratings of Perceived Exertion  11-13    Perceived Dyspnea  0-4      Progression   Progression  Continue progressive overload as per policy without signs/symptoms or physical distress.      Resistance Training   Training Prescription  Yes    Weight  blue bands    Reps  10-15       Perform Capillary Blood Glucose checks as needed.  Exercise Prescription Changes: Exercise Prescription Changes  Row Name 07/03/17 1200 07/05/17 1200 07/10/17 1200 07/12/17 1200 07/24/17 1200     Response to Exercise   Blood Pressure (Admit)  118/68  138/80  110/68  132/60  122/62   Blood Pressure (Exercise)  126/72  134/84  132/68  130/70  126/82   Blood Pressure (Exit)  114/70  110/70  104/60  116/70  102/64   Heart Rate (Admit)  72 bpm  65 bpm  78 bpm  67 bpm  78 bpm   Heart Rate (Exercise)  104 bpm  104 bpm  108 bpm  95 bpm  98 bpm   Heart Rate (Exit)  75 bpm  80 bpm  71 bpm  67 bpm  85 bpm   Oxygen Saturation (Admit)  92 %  96 %  98 %  98 %  100 %   Oxygen Saturation (Exercise)  89 %  90 %  87 %  89 %  96 %   Oxygen Saturation (Exit)  99 %  98 %  98 %  99 %  97 %   Rating of Perceived Exertion (Exercise)  _0 Perceived Dyspnea (Exercise)  _1 Duration  Progress to 45 minutes of aerobic exercise without signs/symptoms of physical distress  Progress to 45 minutes of aerobic exercise without signs/symptoms of physical distress  Progress to 45 minutes of aerobic exercise without signs/symptoms of physical distress  Progress to 45 minutes of aerobic exercise without signs/symptoms of physical distress  Progress to 45 minutes of aerobic exercise without signs/symptoms of physical distress   Intensity  Other (comment) 40-80% of HRR  Other (comment) 40-80% of HRR  Other (comment) 40-80% of HRR  THRR unchanged  THRR unchanged     Progression   Progression  Continue to progress workloads to maintain intensity without signs/symptoms of physical distress.  Continue to progress  workloads to maintain intensity without signs/symptoms of physical distress.  Continue to progress workloads to maintain intensity without signs/symptoms of physical distress.  Continue to progress workloads to maintain intensity without signs/symptoms of physical distress.  Continue to progress workloads to maintain intensity without signs/symptoms of physical distress.     Resistance Training   Training Prescription  Yes  Yes  Yes  Yes  Yes   Weight  blue bands  blue bands  blue bands  blue bands  blue bands   Reps  10-15  10-15  10-15  10-15  10-15   Time  10 Minutes  10 Minutes  10 Minutes  10 Minutes  10 Minutes     Oxygen   Oxygen  Continuous  Continuous  Continuous  Continuous  Continuous   Liters  _2 Bike   Level  0.5  -  0.5  0.5  0.5   Minutes  17  -  _3 NuStep   Level  _4 -  4   Minutes  _5 -  17   METs  1.9  2.1  2.1  -  2.1     Track   Laps  _6 -   Minutes  _7 -   Row Name 07/26/17 1200 07/31/17 1600 08/02/17  1200 08/07/17 1200 08/09/17 1200     Response to Exercise   Blood Pressure (Admit)  146/76  102/70  122/76  116/62  116/64   Blood Pressure (Exercise)  150/76  136/74  130/64  122/82  108/70   Blood Pressure (Exit)  122/80  120/70  112/70  114/75  104/68   Heart Rate (Admit)  78 bpm  85 bpm  80 bpm  77 bpm  77 bpm   Heart Rate (Exercise)  107 bpm  93 bpm  110 bpm  105 bpm  94 bpm   Heart Rate (Exit)  90 bpm  82 bpm  81 bpm  81 bpm  73 bpm   Oxygen Saturation (Admit)  93 %  96 %  96 %  95 %  97 %   Oxygen Saturation (Exercise)  88 %  88 %  91 %  90 %  93 %   Oxygen Saturation (Exit)  97 %  97 %  98 %  97 %  98 %   Rating of Perceived Exertion (Exercise)  _0 Perceived Dyspnea (Exercise)  _1 Duration  Progress to 45 minutes of aerobic exercise without signs/symptoms of physical distress  Progress to 45 minutes of aerobic exercise without signs/symptoms of physical  distress  Progress to 45 minutes of aerobic exercise without signs/symptoms of physical distress  Progress to 45 minutes of aerobic exercise without signs/symptoms of physical distress  Progress to 45 minutes of aerobic exercise without signs/symptoms of physical distress   Intensity  THRR unchanged  THRR unchanged  THRR unchanged  THRR unchanged  THRR unchanged     Progression   Progression  Continue to progress workloads to maintain intensity without signs/symptoms of physical distress.  Continue to progress workloads to maintain intensity without signs/symptoms of physical distress.  Continue to progress workloads to maintain intensity without signs/symptoms of physical distress.  Continue to progress workloads to maintain intensity without signs/symptoms of physical distress.  Continue to progress workloads to maintain intensity without signs/symptoms of physical distress.     Resistance Training   Training Prescription  Yes  Yes  Yes  Yes  Yes   Weight  blue bands  b;ue bands  b;ue bands  b;ue bands  b;ue bands   Reps  10-15  10-15  10-15  10-15  10-15   Time  10 Minutes  10 Minutes  10 Minutes  10 Minutes  10 Minutes     Oxygen   Oxygen  Continuous  -  Continuous  Continuous  Continuous   Liters  3  -  _2 Bike   Level  0.5  0.5  0.5  0.5  -   Minutes  _3 -     NuStep   Level  4  4  -  5  5   Minutes  17  17  -  17  17   METs  1.9  1.9  -  2.1  2     Track   Laps  9  -  _4 Minutes  17  -  _5 Row Name 08/14/17 1200 08/21/17 1300 08/23/17 1200 08/28/17 1200 08/30/17 1200     Response to Exercise   Blood Pressure (  Admit)  118/62  142/84  118/80  122/74  116/66   Blood Pressure (Exercise)  138/80  142/84  102/60  112/70  120/70   Blood Pressure (Exit)  100/60  108/72  125/77  106/64  104/70   Heart Rate (Admit)  84 bpm  75 bpm  80 bpm  80 bpm  83 bpm   Heart Rate (Exercise)  102 bpm  103 bpm  104 bpm  111 bpm  99 bpm   Heart Rate (Exit)   89 bpm  81 bpm  88 bpm  90 bpm  88 bpm   Oxygen Saturation (Admit)  94 %  93 %  96 %  95 %  97 %   Oxygen Saturation (Exercise)  89 %  92 %  87 %  92 %  93 %   Oxygen Saturation (Exit)  98 %  99 %  96 %  98 %  99 %   Rating of Perceived Exertion (Exercise)  _0 Perceived Dyspnea (Exercise)  _1 Duration  Progress to 45 minutes of aerobic exercise without signs/symptoms of physical distress  Progress to 45 minutes of aerobic exercise without signs/symptoms of physical distress  Progress to 45 minutes of aerobic exercise without signs/symptoms of physical distress  Progress to 45 minutes of aerobic exercise without signs/symptoms of physical distress  Progress to 45 minutes of aerobic exercise without signs/symptoms of physical distress   Intensity  THRR unchanged  THRR unchanged  THRR unchanged  THRR unchanged  THRR unchanged     Progression   Progression  Continue to progress workloads to maintain intensity without signs/symptoms of physical distress.  Continue to progress workloads to maintain intensity without signs/symptoms of physical distress.  Continue to progress workloads to maintain intensity without signs/symptoms of physical distress.  Continue to progress workloads to maintain intensity without signs/symptoms of physical distress.  Continue to progress workloads to maintain intensity without signs/symptoms of physical distress.     Resistance Training   Training Prescription  Yes  Yes  Yes  Yes  Yes   Weight  b;ue bands  b;ue bands  b;ue bands  b;ue bands  blue bands   Reps  10-15  10-15  10-15  10-15  10-15   Time  10 Minutes  10 Minutes  10 Minutes  10 Minutes  10 Minutes     Oxygen   Oxygen  Continuous  Continuous  Continuous  Continuous  Continuous   Liters  _2 Bike   Level  0.5  -  0.5  0.8  0.8   Minutes  17  -  _3 NuStep   Level  _4 Minutes  _5 METs  2  1.9  2.1  1.9  1.8     Track    Laps  _6 -   Minutes  _7 -     Home Exercise Plan   Plans to continue exercise at  Home (comment)  -  -  -  -   Frequency  Add 3 additional days to program exercise sessions.  -  -  -  -  Row Name 09/04/17 1100 09/06/17 1200 10/09/17 1200 10/11/17 1300 10/18/17 1200     Response to Exercise   Blood Pressure (Admit)  102/60  118/60  112/64  104/66  110/60   Blood Pressure (Exercise)  104/70  120/76  96/64  128/64  134/80   Blood Pressure (Exit)  114/60  114/60  110/60  108/64  100/64   Heart Rate (Admit)  87 bpm  84 bpm  72 bpm  71 bpm  87 bpm   Heart Rate (Exercise)  104 bpm  110 bpm  112 bpm  93 bpm  94 bpm   Heart Rate (Exit)  83 bpm  86 bpm  81 bpm  69 bpm  88 bpm   Oxygen Saturation (Admit)  96 %  95 %  94 %  96 %  94 %   Oxygen Saturation (Exercise)  90 %  92 %  88 %  93 %  94 %   Oxygen Saturation (Exit)  98 %  97 %  99 %  95 %  99 %   Rating of Perceived Exertion (Exercise)  _0 12.5   Perceived Dyspnea (Exercise)  _1 Duration  Progress to 45 minutes of aerobic exercise without signs/symptoms of physical distress  Progress to 45 minutes of aerobic exercise without signs/symptoms of physical distress  Progress to 45 minutes of aerobic exercise without signs/symptoms of physical distress  Progress to 45 minutes of aerobic exercise without signs/symptoms of physical distress  Progress to 45 minutes of aerobic exercise without signs/symptoms of physical distress   Intensity  THRR unchanged  THRR unchanged  THRR unchanged  THRR unchanged  THRR unchanged     Progression   Progression  Continue to progress workloads to maintain intensity without signs/symptoms of physical distress.  Continue to progress workloads to maintain intensity without signs/symptoms of physical distress.  Continue to progress workloads to maintain intensity without signs/symptoms of physical distress.  Continue to progress workloads to maintain intensity without  signs/symptoms of physical distress.  Continue to progress workloads to maintain intensity without signs/symptoms of physical distress.     Resistance Training   Training Prescription  Yes  Yes  Yes  Yes  Yes   Weight  blue bands  blue bands  blue bands  blue bands  blue bands   Reps  10-15  10-15  10-15  10-15  10-15   Time  10 Minutes  10 Minutes  10 Minutes  10 Minutes  10 Minutes     Oxygen   Oxygen  Continuous  Continuous  Continuous  Continuous  Continuous   Liters  _2 2-3  2-3     Bike   Level  1  1  0.8  0.8  0.8   Minutes  _3 NuStep   Level  6  -  _4 Minutes  17  -  _5 METs  1.9  -  2.3  2.1  2     Track   Laps  _6 -  -   Minutes  _7 -  -   Row Name 10/30/17 1200  Response to Exercise   Blood Pressure (Admit)  110/66       Blood Pressure (Exercise)  100/62       Blood Pressure (Exit)  102/60       Heart Rate (Admit)  71 bpm       Heart Rate (Exercise)  93 bpm       Heart Rate (Exit)  67 bpm       Oxygen Saturation (Admit)  94 %       Oxygen Saturation (Exercise)  93 %       Oxygen Saturation (Exit)  96 %       Rating of Perceived Exertion (Exercise)  13       Perceived Dyspnea (Exercise)  3       Duration  Progress to 45 minutes of aerobic exercise without signs/symptoms of physical distress       Intensity  THRR unchanged         Progression   Progression  Continue to progress workloads to maintain intensity without signs/symptoms of physical distress.         Resistance Training   Training Prescription  Yes       Weight  blue bands       Reps  10-15       Time  10 Minutes         Oxygen   Oxygen  Continuous       Liters  2-3         Bike   Level  0.8       Minutes  17         NuStep   Level  7       Minutes  17       METs  2.1         Track   Laps  8       Minutes  17          Exercise Comments: Exercise Comments    Row Name 08/14/17 1651            Exercise Comments  home exercise completed          Exercise Goals and Review: Exercise Goals    [place] Name 06/18/17 1420             Exercise Goals   Increase Physical Activity  Yes       Intervention  Provide advice, education, support and counseling about physical activity/exercise needs.;Develop an individualized exercise prescription for aerobic and resistive training based on initial evaluation findings, risk stratification, comorbidities and participant's personal goals.       Expected Outcomes  Achievement of increased cardiorespiratory fitness and enhanced flexibility, muscular endurance and strength shown through measurements of functional capacity and personal statement of participant.       Increase Strength and Stamina  Yes       Intervention  Provide advice, education, support and counseling about physical activity/exercise needs.;Develop an individualized exercise prescription for aerobic and resistive training based on initial evaluation findings, risk stratification, comorbidities and participant's personal goals.       Expected Outcomes  Achievement of increased cardiorespiratory fitness and enhanced flexibility, muscular endurance and strength shown through measurements of functional capacity and personal statement of participant.          Exercise Goals Re-Evaluation : Exercise Goals Re-Evaluation    Row Name 07/10/17 [id number] 07/30/17 1214 09/03/17 0803 09/27/17 1636 10/16/17 0752     Exercise Goal  Re-Evaluation   Exercise Goals Review  Increase Physical Activity;Increase Strength and Stamina  Increase Physical Activity;Increase Strength and Stamina;Able to understand and use Dyspnea scale;Able to understand and use rate of perceived exertion (RPE) scale;Knowledge and understanding of Target Heart Rate Range (THRR);Understanding of Exercise Prescription  Increase Physical Activity;Increase Strength and Stamina;Able to understand and use Dyspnea scale;Able to understand and  use rate of perceived exertion (RPE) scale;Knowledge and understanding of Target Heart Rate Range (THRR);Understanding of Exercise Prescription  Increase Strength and Stamina;Increase Physical Activity;Able to understand and use Dyspnea scale;Able to understand and use rate of perceived exertion (RPE) scale;Knowledge and understanding of Target Heart Rate Range (THRR);Understanding of Exercise Prescription  Increase Strength and Stamina;Increase Physical Activity;Able to understand and use Dyspnea scale;Able to understand and use rate of perceived exertion (RPE) scale;Knowledge and understanding of Target Heart Rate Range (THRR);Understanding of Exercise Prescription   Comments  Patient has only attended two exercise sessions. Will cont. to monitor and progress as able.   Patient is slowly progressing in program. Very excited when lost 1.1 pounds during last session. Very proud of her achievement. States that she is watching her diet. Will complete home exercise.    Patient is progressing well in program. Attendance weighs heavily on getting a ride to rehab. Very motivated to make changes. Is exercising at home. Will cont. to monitor and progress as able.   Patient has been out due to chest pain. Performed stress test-all was normal. Patient will return to rehab next week.   Patient has been approved to come back to rehab post stress test. Patient has attended two sessions. Is showing initiative and motivation. Will cont. to monitor and progress as able.    Expected Outcomes  Through exercise and education at rehab and at home, patient will increase strength and stamina. Patient will also gain a better understanding of the need for physical activity on a daily basis and the effects it can have on quality of life.  Through exercise and education at rehab and at home, patient will increase strength and stamina. Patient will also gain a better understanding of the need for physical activity on a daily basis and the  effects it can have on quality of life.  Through exercise and education at rehab and at home, patient will increase strength and stamina. Patient will also gain a better understanding of the need for physical activity on a daily basis and the effects it can have on quality of life.  Through exercise and education at rehab and at home, patient will increase strength and stamina. Patient will also gain a better understanding of the need for physical activity on a daily basis and the effects it can have on quality of life.  Through exercise and education at rehab and at home, patient will increase strength and stamina. Patient will also gain a better understanding of the need for physical activity on a daily basis and the effects it can have on quality of life.   [place] Name 11/01/17 0707             Exercise Goal Re-Evaluation   Exercise Goals Review  Increase Physical Activity;Increase Strength and Stamina;Able to understand and use Dyspnea scale;Able to understand and use rate of perceived exertion (RPE) scale;Understanding of Exercise Prescription;Knowledge and understanding of Target Heart Rate Range (THRR)       Comments  Patient states that she feels deconditioned due to her length of absense from rehab. Transportation is still an issue. Is  not exercising at home. Will re-encourage. Will cont. to monitor and motivate.        Expected Outcomes  Through exercise and education at rehab and at home, patient will increase strength and stamina. Patient will also gain a better understanding of the need for physical activity on a daily basis and the effects it can have on quality of life.          Discharge Exercise Prescription (Final Exercise Prescription Changes): Exercise Prescription Changes - 10/30/17 1200      Response to Exercise   Blood Pressure (Admit)  110/66    Blood Pressure (Exercise)  100/62    Blood Pressure (Exit)  102/60    Heart Rate (Admit)  71 bpm    Heart Rate (Exercise)  93 bpm     Heart Rate (Exit)  67 bpm    Oxygen Saturation (Admit)  94 %    Oxygen Saturation (Exercise)  93 %    Oxygen Saturation (Exit)  96 %    Rating of Perceived Exertion (Exercise)  13    Perceived Dyspnea (Exercise)  3    Duration  Progress to 45 minutes of aerobic exercise without signs/symptoms of physical distress    Intensity  THRR unchanged      Progression   Progression  Continue to progress workloads to maintain intensity without signs/symptoms of physical distress.      Resistance Training   Training Prescription  Yes    Weight  blue bands    Reps  10-15    Time  10 Minutes      Oxygen   Oxygen  Continuous    Liters  2-3      Bike   Level  0.8    Minutes  17      NuStep   Level  7    Minutes  17    METs  2.1      Track   Laps  8    Minutes  17       Nutrition:  Target Goals: Understanding of nutrition guidelines, daily intake of sodium <1578m, cholesterol <2025m calories 30% from fat and 7% or less from saturated fats, daily to have 5 or more servings of fruits and vegetables.  Biometrics: Pre Biometrics - 06/18/17 1447      Pre Biometrics   Grip Strength  29 kg        Nutrition Therapy Plan and Nutrition Goals: Nutrition Therapy & Goals - 07/09/17 1515      Nutrition Therapy   Diet  Therapeutic Lifestyle Changes      Personal Nutrition Goals   Nutrition Goal  Identify food quantities necessary to achieve wt loss of 1-2 lb per week to a goal wt loss of 2.7-10.9 kg (6-24 lb) at graduation from pulmonary rehab.    Personal Goal #2  Describe the benefit of including fruits, vegetables, whole grains, and low-fat dairy products in a healthy meal plan.      Intervention Plan   Intervention  Prescribe, educate and counsel regarding individualized specific dietary modifications aiming towards targeted core components such as weight, hypertension, lipid management, diabetes, heart failure and other comorbidities.    Expected Outcomes  Short Term Goal:  Understand basic principles of dietary content, such as calories, fat, sodium, cholesterol and nutrients.;Long Term Goal: Adherence to prescribed nutrition plan.       Nutrition Discharge: Rate Your Plate Scores: Nutrition Assessments - 07/09/17 1515      Rate Your Plate  Scores   Pre Score  52       Nutrition Goals Re-Evaluation:   Nutrition Goals Discharge (Final Nutrition Goals Re-Evaluation):   Psychosocial: Target Goals: Acknowledge presence or absence of significant depression and/or stress, maximize coping skills, provide positive support system. Participant is able to verbalize types and ability to use techniques and skills needed for reducing stress and depression.  Initial Review & Psychosocial Screening: Initial Psych Review & Screening - 06/18/17 1438      Initial Review   Current issues with  Current Sleep Concerns sleep deprevation from hot flashes      Family Dynamics   Good Support System?  Yes    Comments  recent loss of mother      Barriers   Psychosocial barriers to participate in program  There are no identifiable barriers or psychosocial needs.      Screening Interventions   Interventions  Encouraged to exercise       Quality of Life Scores:   PHQ-9: Recent Review Flowsheet Data    Depression screen [facility] 2/9 06/18/2017 04/12/2017 03/29/2017 03/13/2017 02/05/2017   Decreased Interest 0 0 0 0 0   Down, Depressed, Hopeless 0 0 0 0 0   PHQ - 2 Score 0 0 0 0 0     Interpretation of Total Score  Total Score Depression Severity:  1-4 = Minimal depression, 5-9 = Mild depression, 10-14 = Moderate depression, 15-19 = Moderately severe depression, 20-27 = Severe depression   Psychosocial Evaluation and Intervention: Psychosocial Evaluation - 06/18/17 1440      Psychosocial Evaluation & Interventions   Interventions  Encouraged to exercise with the program and follow exercise prescription    Expected Outcomes  patient will remain free from psychosocial  barriers    Continue Psychosocial Services   Follow up required by staff       Psychosocial Re-Evaluation: Psychosocial Re-Evaluation    [place] Name 07/11/17 1716 08/03/17 1153 08/31/17 1209 09/24/17 1210 10/16/17 1414     Psychosocial Re-Evaluation   Current issues with  Current Sleep Concerns  Current Sleep Concerns  None Identified  None Identified  None Identified   Comments  recent loss of mother  recent loss of mother  sleep concerns have resolved  -  sleep concerns have resolved   Expected Outcomes  patient will remain free from psychosocial barriers to participation in pulmonary rehab  patient will remain free from psychosocial barriers to participation in pulmonary rehab  patient will remain free from psychosocial barriers to participation in pulmonary rehab  patient will remain free from psychosocial barriers to participation in pulmonary rehab  patient will remain free from psychosocial barriers to participation in pulmonary rehab   Interventions  Encouraged to attend Pulmonary Rehabilitation for the exercise  Encouraged to attend Pulmonary Rehabilitation for the exercise  Encouraged to attend Pulmonary Rehabilitation for the exercise  Encouraged to attend Pulmonary Rehabilitation for the exercise  Encouraged to attend Pulmonary Rehabilitation for the exercise   Continue Psychosocial Services   Follow up required by staff  Follow up required by staff  Follow up required by staff  Follow up required by staff  Follow up required by staff   Row Name 11/08/17 0808             Psychosocial Re-Evaluation   Current issues with  None Identified       Comments  sleep concerns have resolved       Expected Outcomes  patient will remain free from  psychosocial barriers to participation in pulmonary rehab       Interventions  Encouraged to attend Pulmonary Rehabilitation for the exercise       Continue Psychosocial Services   Follow up required by staff          Psychosocial Discharge (Final  Psychosocial Re-Evaluation): Psychosocial Re-Evaluation - 11/08/17 0808      Psychosocial Re-Evaluation   Current issues with  None Identified    Comments  sleep concerns have resolved    Expected Outcomes  patient will remain free from psychosocial barriers to participation in pulmonary rehab    Interventions  Encouraged to attend Pulmonary Rehabilitation for the exercise    Continue Psychosocial Services   Follow up required by staff       Education: Education Goals: Education classes will be provided on a weekly basis, covering required topics. Participant will state understanding/return demonstration of topics presented.  Learning Barriers/Preferences: Learning Barriers/Preferences - 06/18/17 1436      Learning Barriers/Preferences   Learning Barriers  None    Learning Preferences  Computer/Internet;Group Instruction;Individual Instruction;Verbal Instruction;Written Material       Education Topics: Risk Factor Reduction:  -Group instruction that is supported by a PowerPoint presentation. Instructor discusses the definition of a risk factor, different risk factors for pulmonary disease, and how the heart and lungs work together.     Nutrition for Pulmonary Patient:  -Group instruction provided by PowerPoint slides, verbal discussion, and written materials to support subject matter. The instructor gives an explanation and review of healthy diet recommendations, which includes a discussion on weight management, recommendations for fruit and vegetable consumption, as well as protein, fluid, caffeine, fiber, sodium, sugar, and alcohol. Tips for eating when patients are short of breath are discussed.   PULMONARY REHAB OTHER RESPIRATORY from 10/18/2017 in [place]  Date  08/30/17  Educator  Nutritionist  Instruction Review Code  R- Review/reinforce      Pursed Lip Breathing:  -Group instruction that is supported by demonstration and informational  handouts. Instructor discusses the benefits of pursed lip and diaphragmatic breathing and detailed demonstration on how to preform both.     Oxygen Safety:  -Group instruction provided by PowerPoint, verbal discussion, and written material to support subject matter. There is an overview of "What is Oxygen" and "Why do we need it".  Instructor also reviews how to create a safe environment for oxygen use, the importance of using oxygen as prescribed, and the risks of noncompliance. There is a brief discussion on traveling with oxygen and resources the patient may utilize.   PULMONARY REHAB OTHER RESPIRATORY from 10/18/2017 in [place]  Date  09/06/17  Educator  Truddie Crumble  Instruction Review Code  2- meets goals/outcomes      Oxygen Equipment:  -Group instruction provided by [employer] Staff utilizing handouts, written materials, and equipment demonstrations.   PULMONARY REHAB OTHER RESPIRATORY from 10/18/2017 in [place]  Date  10/11/17  Educator  [facility]  Instruction Review Code  2- meets goals/outcomes      Signs and Symptoms:  -Group instruction provided by written material and verbal discussion to support subject matter. Warning signs and symptoms of infection, stroke, and heart attack are reviewed and when to call the physician/911 reinforced. Tips for preventing the spread of infection discussed.   PULMONARY REHAB OTHER RESPIRATORY from 10/18/2017 in [place]  Date  08/02/17  Educator  RN  Instruction Review Code  2- meets goals/outcomes      Advanced Directives:  -Group instruction provided by verbal instruction and written material to support subject matter. Instructor reviews Advanced Directive laws and proper instruction for filling out document.   Pulmonary Video:  -Group video education that reviews the importance of medication and oxygen compliance, exercise, good  nutrition, pulmonary hygiene, and pursed lip and diaphragmatic breathing for the pulmonary patient.   PULMONARY REHAB OTHER RESPIRATORY from 10/18/2017 in [place]  Date  08/09/17  Instruction Review Code  2- meets goals/outcomes      Exercise for the Pulmonary Patient:  -Group instruction that is supported by a PowerPoint presentation. Instructor discusses benefits of exercise, core components of exercise, frequency, duration, and intensity of an exercise routine, importance of utilizing pulse oximetry during exercise, safety while exercising, and options of places to exercise outside of rehab.     PULMONARY REHAB OTHER RESPIRATORY from 10/18/2017 in [place]  Date  07/24/17  Educator  ep  Instruction Review Code  2- meets goals/outcomes      Pulmonary Medications:  -Verbally interactive group education provided by instructor with focus on inhaled medications and proper administration.   PULMONARY REHAB OTHER RESPIRATORY from 10/18/2017 in [place]  Date  07/12/17  Educator  Pharmacist  Instruction Review Code  2- meets goals/outcomes      Anatomy and Physiology of the Respiratory System and Intimacy:  -Group instruction provided by PowerPoint, verbal discussion, and written material to support subject matter. Instructor reviews respiratory cycle and anatomical components of the respiratory system and their functions. Instructor also reviews differences in obstructive and restrictive respiratory diseases with examples of each. Intimacy, Sex, and Sexuality differences are reviewed with a discussion on how relationships can change when diagnosed with pulmonary disease. Common sexual concerns are reviewed.   MD DAY -A group question and answer session with a medical doctor that allows participants to ask questions that relate to their pulmonary disease state.   PULMONARY REHAB OTHER  RESPIRATORY from 10/18/2017 in [place]  Date  08/21/17  Educator  yacoub  Instruction Review Code  2- meets goals/outcomes      OTHER EDUCATION -Group or individual verbal, written, or video instructions that support the educational goals of the pulmonary rehab program.   Knowledge Questionnaire Score:   Core Components/Risk Factors/Patient Goals at Admission: Personal Goals and Risk Factors at Admission - 06/18/17 1436      Core Components/Risk Factors/Patient Goals on Admission    Weight Management  Obesity;Yes    Intervention  Weight Management: Develop a combined nutrition and exercise program designed to reach desired caloric intake, while maintaining appropriate intake of nutrient and fiber, sodium and fats, and appropriate energy expenditure required for the weight goal.;Weight Management/Obesity: Establish reasonable short term and long term weight goals.;Weight Management: Provide education and appropriate resources to help participant work on and attain dietary goals.;Obesity: Provide education and appropriate resources to help participant work on and attain dietary goals.    Expected Outcomes  Short Term: Continue to assess and modify interventions until short term weight is achieved;Long Term: Adherence to nutrition and physical activity/exercise program aimed toward attainment of established weight goal;Understanding of distribution of calorie intake throughout the day with the consumption of 4-5 meals/snacks;Understanding recommendations for meals to include 15-35% energy as protein, 25-35% energy from fat, 35-60% energy from carbohydrates, less than 283m of dietary  cholesterol, 20-35 gm of total fiber daily;Weight Loss: Understanding of general recommendations for a balanced deficit meal plan, which promotes 1-2 lb weight loss per week and includes a negative energy balance of [phone] kcal/d    Improve shortness of breath with ADL's  Yes     Intervention  Provide education, individualized exercise plan and daily activity instruction to help decrease symptoms of SOB with activities of daily living.    Expected Outcomes  Short Term: Achieves a reduction of symptoms when performing activities of daily living.    Develop more efficient breathing techniques such as purse lipped breathing and diaphragmatic breathing; and practicing self-pacing with activity  Yes    Intervention  Provide education, demonstration and support about specific breathing techniuqes utilized for more efficient breathing. Include techniques such as pursed lipped breathing, diaphragmatic breathing and self-pacing activity.    Expected Outcomes  Short Term: Participant will be able to demonstrate and use breathing techniques as needed throughout daily activities.    Increase knowledge of respiratory medications and ability to use respiratory devices properly   Yes    Intervention  Provide education and demonstration as needed of appropriate use of medications, inhalers, and oxygen therapy.    Expected Outcomes  Short Term: Achieves understanding of medications use. Understands that oxygen is a medication prescribed by physician. Demonstrates appropriate use of inhaler and oxygen therapy.       Core Components/Risk Factors/Patient Goals Review:  Goals and Risk Factor Review    Row Name 07/11/17 1715 08/03/17 1152 08/31/17 1207 09/24/17 1208 10/16/17 1411     Core Components/Risk Factors/Patient Goals Review   Personal Goals Review  Weight Management/Obesity;Develop more efficient breathing techniques such as purse lipped breathing and diaphragmatic breathing and practicing self-pacing with activity.;Improve shortness of breath with ADL's  Weight Management/Obesity;Develop more efficient breathing techniques such as purse lipped breathing and diaphragmatic breathing and practicing self-pacing with activity.;Improve shortness of breath with ADL's  Weight  Management/Obesity;Develop more efficient breathing techniques such as purse lipped breathing and diaphragmatic breathing and practicing self-pacing with activity.;Improve shortness of breath with ADL's  Weight Management/Obesity;Develop more efficient breathing techniques such as purse lipped breathing and diaphragmatic breathing and practicing self-pacing with activity.;Improve shortness of breath with ADL's  Weight Management/Obesity;Develop more efficient breathing techniques such as purse lipped breathing and diaphragmatic breathing and practicing self-pacing with activity.;Improve shortness of breath with ADL's   Review  Patient has attended 3 sessions since admission. She works hard and is tolerating small workload increases. it is too soon to evaluate progress towards goals. Will re-eval in 30 days  patient continues to work hard in pulmonary rehab. she has lost 1.1 kg at her last visit. she is watching her caloric intake and making smarter food choices. she is tolerating small workload increases and states she really enjoys coming to pulmonary rehab.  patient continues to progress in pulmonary rehab. Her weight remains stable but she is not gaining weight. She continues to tolerate workload increases.  patient was doing well in pulmonary rehab until she was placed on medical hold for chest discomfort. She is scheduled for a stress test on 11/12 and depending on results, medical hold will be removed.  patient has only attended 2 session since she has returned from medical hold. she was able to return at the same workload levels as her last session. She is still able to maintain and RPE of 11-13. She continues to use PLB. She was able to maintain the weightloss she had before her  medical hold. will evaluate progression towards an improvement in her SOB over the next 30 days.   Expected Outcomes  see admission expected outcomes  see admission expected outcomes  see admission expected outcomes  see admission  expected outcomes  see admission expected outcomes   Row Name 11/08/17 0806             Core Components/Risk Factors/Patient Goals Review   Personal Goals Review  Weight Management/Obesity;Develop more efficient breathing techniques such as purse lipped breathing and diaphragmatic breathing and practicing self-pacing with activity.;Improve shortness of breath with ADL's       Review  Patient is tolerating increases in workloads since returning from medical hold. She has missed all exercise sessions sicne 12/18. Hopefully patient will return today. Her weight remains stable. She has mastered the technique of PLB for shortness of breath.       Expected Outcomes  see admission expected outcomes          Core Components/Risk Factors/Patient Goals at Discharge (Final Review):  Goals and Risk Factor Review - 11/08/17 0806      Core Components/Risk Factors/Patient Goals Review   Personal Goals Review  Weight Management/Obesity;Develop more efficient breathing techniques such as purse lipped breathing and diaphragmatic breathing and practicing self-pacing with activity.;Improve shortness of breath with ADL's    Review  Patient is tolerating increases in workloads since returning from medical hold. She has missed all exercise sessions sicne 12/18. Hopefully patient will return today. Her weight remains stable. She has mastered the technique of PLB for shortness of breath.    Expected Outcomes  see admission expected outcomes       ITP Comments:   Comments: Patient has attended 22 session since admission to pulmonary rehab. She has been extended to 29 because of her extended absence related to medical hold.

## 2017-11-15 ENCOUNTER — Encounter (HOSPITAL_COMMUNITY): Payer: BLUE CROSS/BLUE SHIELD

## 2017-11-20 ENCOUNTER — Encounter (HOSPITAL_COMMUNITY): Payer: BLUE CROSS/BLUE SHIELD

## 2017-11-22 ENCOUNTER — Encounter (HOSPITAL_COMMUNITY)
Admission: RE | Admit: 2017-11-22 | Discharge: 2017-11-22 | Disposition: A | Discharge status: Home / Self Care | Payer: BLUE CROSS/BLUE SHIELD | Source: Ambulatory Visit | Attending: Internal Medicine | Admitting: Internal Medicine

## 2017-11-22 ENCOUNTER — Encounter (HOSPITAL_COMMUNITY): Payer: BLUE CROSS/BLUE SHIELD

## 2017-11-22 VITALS — Wt 207.7 lb

## 2017-11-22 DIAGNOSIS — R0609 Other forms of dyspnea: Secondary | ICD-10-CM | POA: Diagnosis not present

## 2017-11-22 DIAGNOSIS — D860 Sarcoidosis of lung: Secondary | ICD-10-CM

## 2017-11-22 NOTE — Progress Notes (Signed)
Daily Session Note  Patient Details  Name: [name] MRN: [id number] Date of Birth: Oct 08, 1961 Referring Provider:     Pulmonary Rehab Walk Test from 06/26/2017 in [place]  Referring Provider  Dr. Melvyn Novas      Encounter Date: 11/22/2017  Check In: Session Check In - 11/22/17 1106      Check-In   Location  [facility]    Staff Present  Rodney Langton, RN;Portia Rollene Rotunda, RN, BSN;Molly diVincenzo, MS, ACSM RCEP, Exercise Physiologist;Vedh Ptacek Leonia Reeves, RN, BSN    Supervising physician immediately available to respond to emergencies  [facility] immediately available    Physician(s)  [place]    Medication changes reported      No    Fall or balance concerns reported     No    Tobacco Cessation  No Change    Warm-up and Cool-down  Performed as group-led Higher education careers adviser Performed  Yes    VAD Patient?  No      Pain Assessment   Currently in Pain?  No/denies    Multiple Pain Sites  No       Capillary Blood Glucose: No results found for this or any previous visit (from the past 24 hour(s)).  Exercise Prescription Changes - 11/22/17 1200      Response to Exercise   Blood Pressure (Admit)  100/56    Blood Pressure (Exercise)  140/76    Blood Pressure (Exit)  118/78    Heart Rate (Admit)  72 bpm    Heart Rate (Exercise)  104 bpm    Heart Rate (Exit)  75 bpm    Oxygen Saturation (Admit)  95 %    Oxygen Saturation (Exercise)  89 %    Oxygen Saturation (Exit)  97 %    Rating of Perceived Exertion (Exercise)  13    Perceived Dyspnea (Exercise)  2    Duration  Progress to 45 minutes of aerobic exercise without signs/symptoms of physical distress    Intensity  THRR unchanged      Progression   Progression  Continue to progress workloads to maintain intensity without signs/symptoms of physical distress.      Resistance Training   Training Prescription  Yes    Weight  blue bands    Reps  10-15    Time  10  Minutes      Oxygen   Oxygen  Continuous    Liters  2-3      Bike   Level  0.8    Minutes  17      Track   Laps  10    Minutes  17       Social History   Tobacco Use  Smoking Status Former Smoker  . Packs/day: 1.00  . Years: 20.00  . Pack years: 20.00  . Last attempt to quit: 11/14/2003  . Years since quitting: 14.0  Smokeless Tobacco Never Used    Goals Met:  Exercise tolerated well Strength training completed today  Goals Unmet:  Not Applicable  Comments: Service time is from 1030 to 1230    Dr. Rush Farmer is Medical Director for Pulmonary Rehab at [facility].

## 2017-11-24 DIAGNOSIS — J9611 Chronic respiratory failure with hypoxia: Secondary | ICD-10-CM | POA: Diagnosis not present

## 2017-11-24 DIAGNOSIS — J449 Chronic obstructive pulmonary disease, unspecified: Secondary | ICD-10-CM | POA: Diagnosis not present

## 2017-11-26 ENCOUNTER — Ambulatory Visit: Payer: BLUE CROSS/BLUE SHIELD | Admitting: Internal Medicine

## 2017-11-26 ENCOUNTER — Encounter: Payer: Self-pay | Admitting: Internal Medicine

## 2017-11-26 VITALS — BP 150/80 | HR 75 | Ht 65.0 in | Wt 209.0 lb

## 2017-11-26 DIAGNOSIS — J9611 Chronic respiratory failure with hypoxia: Secondary | ICD-10-CM | POA: Diagnosis not present

## 2017-11-26 DIAGNOSIS — D869 Sarcoidosis, unspecified: Secondary | ICD-10-CM | POA: Diagnosis not present

## 2017-11-26 DIAGNOSIS — J449 Chronic obstructive pulmonary disease, unspecified: Secondary | ICD-10-CM | POA: Diagnosis not present

## 2017-11-26 DIAGNOSIS — J9612 Chronic respiratory failure with hypercapnia: Secondary | ICD-10-CM

## 2017-11-26 DIAGNOSIS — R05 Cough: Secondary | ICD-10-CM | POA: Diagnosis not present

## 2017-11-26 DIAGNOSIS — R053 Chronic cough: Secondary | ICD-10-CM

## 2017-11-26 DIAGNOSIS — R059 Cough, unspecified: Secondary | ICD-10-CM

## 2017-11-26 MED ORDER — HYDROXYCHLOROQUINE SULFATE 200 MG PO TABS: 200.0000 mg | ORAL_TABLET | Freq: Every day | ORAL | 11 refills | Status: DC

## 2017-11-26 NOTE — Progress Notes (Signed)
Subjective:    Patient ID: [name], female   DOB: May 15, 1961    MRN: [id number]    Brief patient profile:  88 yobf quit smoking 10/2004 dx by transbronchial biopsy 5/92 with NCG inflammatory change consistent with sarcoid. Since that time she's been off and on prednisone multiple  times with flares of coughing dyspnea and skin involvement > weaned off chronic prednisone Feb 2012 and placed back on 10/10/16 for deteriorating obstructive changes on pfts  With resp failure/ 02 dep        History of Present Illness   02/20/2017  f/u ov/[name] re:   GOLD III/ pred 10 mg daily and bevespi 2bid  and alb rare  Chief Complaint  Patient presents with  . Follow-up    6wk rov. pt states breathing is baseline. pt reports of sob with exertion & occ prod cough with green mucus mainly in the morning   Doe better :  [facility] = can't walk a nl pace on a flat grade s sob but does fine slow and flat eg shopping on  2lpm / ok at rest s 02  Uses 02 2lpm sleeping  rec Drop prednisone to 10 mg one half daily      NP eval 05/14/17  We will decrease your prednisone to 2.5 mg daily. We will prescribe 5 mg tablets. We will renew your prescription for Symbicort.  2 puffs twice daily. Rinse mouth after use. Follow up appointment with Dr. Melvyn Novas in 2 months. We will refer you to Pulmonary rehab > started Aug 2018  Referral to a dietician for weight loss >  at rehab     07/17/2017  f/u ov/[name] re:   Sarcoid/ ? Still steroid dep at 2.5mg  daily / 02 none at rest, 2lpm with activity and 3lpm ex  Chief Complaint  Patient presents with  . Follow-up    COPD, still using Symbicort, still SOB with exertion, she has to increase her oxygen to 3L   Doing 3lpm when exercising at rehab and no need for saba on symb 160 2bid  Keeping up with med calendar much better  rec Try prednisone 5 mg take one half on even days only Make a food diary for your dietician to review (list everything you eat/drink x one week for her  feedback on what you are doing right vs wrong) See Tammy NP 3 months with all your medications    09/03/2017  f/u ov/[name] re: disability paperwork on 5 mg even days with h/o sarcoid and severe copd  Chief Complaint  Patient presents with  . Follow-up    Pt states that her breathing has improved with pulmonary rehab. She is taking 5 mg pred every other day and using ventolin 3-4 x per wk.    last worked Oct 04 2016  02 2lpm sitting/ 3lpm with activity and 4lpm with rehab  Can do 15 min on new step machine  s stopping on "5"  And stationery bike at home x 15 min at "3"  Had worked [employer] / carrying 5lb to desk fiinish it  =  100 ft and handed to next person  Lint was bad at workplace and seemed to aggravate cough/ sob  maint symb 160 2bid bid and use saba qod when goes outside eg to mailbox  Has had independent eval x one month prior to OV   Most am's sob going to br before am symbicort/sleeping ok on  on one pillow  rec Plan A =  Automatic =    Stiolto 2 puffs each am  Work on inhaler technique:  Let me know if too expensive on your insurance Plan B = Backup Only use your albuterol as a rescue medication   Keep appt with Tammy for 10/17/17    Date of Admission: 09/07/2017                    Date of Discharge: 09/09/17    Discharge Diagnoses/Problem List:  COPD GOLD stage IV HTN Anemia HLD Breast cancer s/p resection Sarcoidosis GERD  Disposition: home  Discharge Condition: improved  Discharge Exam: see progress note from day of discharge  Brief Hospital Course:  Patient presented with substernal acute chest pressure and subjective heart fluttering x 1 day. Atypical symptoms - occurred at rest and did not change with exertion. No hx of similar, non radiating. In the ED, patient received aspirin 325 mg once. Patient was also given a GI cocktail for nausea.Chest x-ray was unremarkable. Troponins were negative 2. CBC and BMP were within normal limits. EKG without  signs of ischemia. Cardiology was consulted, recommended continued medical optimization and outpatient stress test. Mild anemia noted, anemia panel with borderline low ferritin and iron. Hx of iron deficiency anemia, not on supplementation. Given "heart fluttering," TSH was checked and normal. Patient mentioned depression and anxiety combined with stress of medical burden, desired starting SSRI. Zoloft was started as no hx of mania.   Issues for Follow Up:  1. ACS - cardiology (Dr. Einar Gip) recommended outpatient stress testing. Patient amenable to this.  2. Remainder of medications were continued from home. Patient recently changed to Stiolto per pulm, would ensure she is using this correctly.  3. Zoloft was started for depression, given 90 day supply.   Significant Procedures: none  Significant Labs and Imaging:   LastLabs   Recent Labs Lab 09/07/17 1516 09/08/17 0019 09/08/17 0649  WBC 7.5 6.2 6.1  HGB 11.4* 10.7* 10.5*  HCT 35.9* 33.8* 33.1*  PLT 303 286 291      LastLabs   Recent Labs Lab 09/07/17 1516 09/08/17 0019 09/08/17 0649  NA 140  --  140  K 4.5  --  3.5  CL 98*  --  97*  CO2 32  --  32  GLUCOSE 105*  --  94  BUN 11  --  13  CREATININE 0.91 0.87 0.81  CALCIUM 9.8  --  9.6     Trop neg x 3.   Results/Tests Pending at Time of Discharge: none  Discharge Medications:      Allergies as of 09/09/2017      Reactions   Codeine Other (See Comments)   Avoids-felt bad when took before              Medication List     STOP taking these medications   budesonide-formoterol 160-4.5 MCG/ACT inhaler Commonly known as:  SYMBICORT   diphenhydrAMINE 25 MG tablet Commonly known as:  BENADRYL   ibuprofen 600 MG tablet Commonly known as:  ADVIL,MOTRIN         09/28/2017  f/u ov/[name] re: post hosp f/u  02 2lpm 24/7  Chief Complaint  Patient presents with  . Follow-up    Pt states had to go to ED 09/07/17 with CP- resolved after  nitro. She c/o increased cough with thick, green sputum. She has some bumps under her nose and on both knees- itchy. She has had some blurred vision and redness- seen PCP and dxed  with pink eye- no better after txed with eye gtts they gave her.   on omeprazole 20 before bfast and Pepcid 20 mg at time of admit with dx "gerd induced cp" On the 2.5 qod dosing for sarcoid has had progressive decline and now on 02 24/7/ new skin changes typical for sarcoid under nose and on knees plus am cough x sev weeks green mucus assoc with nasal congestion   rec With cough/ shortness/chest pain > omperzole 40 mg Take 30- 60 min before your first and last meals of the day and pepcid 20 mg at bedtime  Augmentin 875 mg take one pill twice daily  X 10 days - Prednisone 20 mg daily until better, then 10 mg daily x 5 days, then 5mg  daily     11/26/2017  f/u ov/[name] re:  Sarcoid/skin involvement chronic cough ? Sinus dz/ using med calendar well / pednisone 10 mg daily  Chief Complaint  Patient presents with  . Follow-up    Cough has improved some, but still producing some greenish yellow sputum first thing in the am.  She has not had to use her albuterol inhaler. She c/o shakiness in her hands for the past couple of months.    less cough p augmentin but still am sputum green / no noct symptoms Doing well with rehab but sometimes has to turn 02 up to 4 lpm there but doesn't do so in other settings and concerned about wt gain   No obvious day to day or daytime variability or assoc  mucus plugs or hemoptysis or cp or chest tightness, subjective wheeze or overt sinus or hb symptoms. No unusual exposure hx or h/o childhood pna/ asthma or knowledge of premature birth.  Sleeping ok flat on 02  without nocturnal    exacerbation  of respiratory  c/o's or need for noct saba. Also denies any obvious fluctuation of symptoms with weather or environmental changes or other aggravating or alleviating factors except as outlined above    Current Allergies, Complete Past Medical History, Past Surgical History, Family History, and Social History were reviewed in [employer] record.  ROS  The following are not active complaints unless bolded Hoarseness, sore throat, dysphagia, dental problems, itching, sneezing,  nasal congestion or discharge of excess mucus or purulent secretions, ear ache,   fever, chills, sweats, unintended wt loss or wt gain, classically pleuritic or exertional cp,  orthopnea pnd or leg swelling, presyncope, palpitations, abdominal pain, anorexia, nausea, vomiting, diarrhea  or change in bowel habits or change in bladder habits, change in stools or change in urine, dysuria, hematuria,  rash, arthralgias, visual complaints, headache, numbness, weakness or ataxia or problems with walking or coordination,  change in mood/affect or memory.        Current Meds  Medication Sig  . acetaminophen (TYLENOL) 500 MG tablet Per bottle as needed  . albuterol (VENTOLIN HFA) 108 (90 Base) MCG/ACT inhaler Inhale 2 puffs into the lungs every 6 (six) hours as needed for wheezing or shortness of breath.  . anastrozole (ARIMIDEX) 1 MG tablet Take 1 mg by mouth daily.  Marland Kitchen aspirin EC 81 MG tablet Take 81 mg by mouth daily.  Marland Kitchen atorvastatin (LIPITOR) 20 MG tablet Take 1 tablet (20 mg total) by mouth daily.  . famotidine (PEPCID) 20 MG tablet Take 20 mg by mouth at bedtime.  . fluticasone (FLONASE) 50 MCG/ACT nasal spray Place 2 sprays into both nostrils daily as needed for allergies.  Marland Kitchen gabapentin (NEURONTIN)  300 MG capsule 2 at bedtime  . guaiFENesin (MUCINEX) 600 MG 12 hr tablet Take 600 mg by mouth 2 (two) times daily as needed.  . metoprolol tartrate (LOPRESSOR) 25 MG tablet Take 1 tablet (25 mg total) by mouth 2 (two) times daily.  . Multiple Vitamin (MULTIVITAMIN) capsule Take 1 capsule by mouth daily.  Marland Kitchen omeprazole (PRILOSEC) 20 MG capsule TAKE 1 CAPSULE BY MOUTH ONCE DAILY  . ondansetron (ZOFRAN) 4 MG  tablet Take 1 tablet (4 mg total) by mouth every 8 (eight) hours as needed for nausea or vomiting.  . OXYGEN 2lpm with rest and 3-4 lpm with exertion  . predniSONE (DELTASONE) 5 MG tablet Take 10 mg by mouth daily with breakfast.   . sertraline (ZOLOFT) 50 MG tablet Take 1 tablet (50 mg total) by mouth daily.  . Tiotropium Bromide-Olodaterol (STIOLTO RESPIMAT) 2.5-2.5 MCG/ACT AERS Inhale 2 puffs into the lungs daily.  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 Unice Cobble  -h/o Daily prednisone since 2007   > off completely Feb 2012 with no problem> restarted  09/2016  ? of SUPERFICIAL VEIN THROMBOSIS (ICD-453.9)   ENDOMETRIAL POLYP (ICD-621.0)   UTERINE FIBROID (ICD-218.9)  RHINITIS, ALLERGIC (ICD-477.9)  HYPERTENSION, BENIGN SYSTEMIC (ICD-401.1)  COPD (ICD-496)  - PFT's 06/26/08 FEV1 1.09 ratio  - PFTs 03/02/10 FEV1 1.06 ratio 34  - PFT's 08/01/2011 FEV1 1.6 (42%)  41 ratio  DLCO 55 corrects 77 - HFA  50% 06/05/2011  > 75% 06/29/2011  - Spiriva trial March 02, 2010 > better but no worse off 04/2011 - Rehab started mid aug 2018  BACK PAIN, LOW (ICD-724.2)  ANEMIA, IRON DEFICIENCY, UNSPEC. (ICD-280.9)  Health Maintenance.......................................................   [facility]   Family History:  crohn`s in daughter, M-lupus, htn, asthma,  no Ca, DM, CAD    Social History:   lives with twin children and grandson. single; >20 pack year history, quit in 2005; no EtOH  Laid off  from works for [employer] Sept 2013           Objective:   Physical Exam wt 166 Oct 12, 2009 >170 October 28, 2010 > 169 06/29/2011 >>171 08/24/2011 > 01/15/2012  171 > 04/24/2012 176 > 06/28/2012  175> 08/13/2012 >  08/30/2012  173 > 170 11/28/2012 > 01/29/2013  176 > 174 >173 06/16/2013 > 171 09/18/2013 > 12/19/2013 177 >179 04/13/2014>  04/23/2014 177 > 08/21/2014 184 >182 11/27/2014 > 05/12/2015 177  > 05/11/2016   177 > 10/27/2016  175 > 11/09/2016 182 > 01/09/2017 198 > 02/20/2017  206 >  07/17/2017   209 >  09/03/2017   210 > 09/28/2017  206  > 11/26/2017   209   amb bf nad   Vital signs reviewed - Note on arrival 02 sats  94% on 3lpm     HEENT: nl dentition, turbinates bilaterally, and oropharynx. Nl external ear canals without cough reflex   NECK :  without JVD/Nodes/TM/ nl carotid upstrokes bilaterally   LUNGS: no acc muscle use,  Nl contour chest which is clear to A and P bilaterally without cough on insp or exp maneuvers   CV:  RRR  no s3 or murmur or increase in P2, and no edema   ABD:  Obese/  soft and nontender with nl inspiratory excursion in the supine position. No bruits or organomegaly appreciated, bowel sounds nl  MS:  Nl gait/ ext warm without deformities, calf tenderness, cyanosis or clubbing No obvious joint restrictions   SKIN: warm and dry without lesions slt hyperpigmented plaque beneath R nostril  Also both knees/ shins   NEURO:  alert, approp, nl sensorium with  no motor or cerebellar deficits apparent.                           . Assessment:

## 2017-11-26 NOTE — Assessment & Plan Note (Signed)
Quit smoking 2005  - PFT's 06/26/08 FEV1 1.09 ratio  - PFTs 03/02/10 FEV1 1.06 ratio 34    PFT's 08/01/2011 FEV1 1.6 (42%)  41 ratio  DLCO 55 corrects 77 - Spiriva trial March 02, 2010 > ? better but not worse off it 04/2011 - 05/12/2015 p extensive coaching HFA effectiveness =    90%  - Referred to Rehab 08/21/2014 > could not arrange due to schedule  -med calendar 06/16/2013 > did not bring to office as requested 12/19/13 or 04/23/14  Or 08/21/2014  - 11/09/2016    changed symb to bevespi since taking chronic pred for pf    - 02/20/2017 decreased pred to 5 mg daily > weaned to 2.5 mg qd as of 07/17/2017  - started rehab mid Aug 2018  - 07/17/2017 changed to pred 2.5 even days > did not tolerate so changed  Around  07/28/17 back to 5 mg qod  - 09/03/2017  After extensive coaching HFA effectiveness =    90% so try stiolto  - Spirometry 09/03/2017  FEV1 0.52 (23%)  Ratio 40 with typical curvature     Improving overall despite wt gain from prednisone for sarcoid - no change rx needed

## 2017-11-26 NOTE — Assessment & Plan Note (Signed)
New start 09/2016 at admit see records - 10/18/16 Patient Saturations on Room Air at Rest = 92% Patient Saturations on Room Air while Ambulating = 84% Patient Saturations on 2 Liters of oxygen while Ambulating = 90% - HCO3  11/09/2016 = 39  - HCO3   03/16/17        = 34   As of  11/26/2017  rec 2lpm sleeping and titrate with ambulation to maintain > 90% just like she does at rehab   I had an extended discussion with the patient reviewing all relevant studies completed to date and  lasting 15 to 20 minutes of a 25 minute visit    Each maintenance medication was reviewed in detail including most importantly the difference between maintenance and prns and under what circumstances the prns are to be triggered using an action plan format that is not reflected in the computer generated alphabetically organized AVS but trather by a customized med calendar that reflects the AVS meds with confirmed 100% correlation.   In addition, Please see AVS for unique instructions that I personally wrote and verbalized to the the pt in detail and then reviewed with pt  by my nurse highlighting any  changes in therapy recommended at today's visit to their plan of care.

## 2017-11-26 NOTE — Patient Instructions (Addendum)
Start plaquenil 200 mg daily   Ok to adjust the prednisone 5mg   from 1-2 pills in am as per med calendar   See calendar for specific medication instructions and bring it back for each and every office visit for every healthcare provider you see.  Without it,  you may not receive the best quality medical care that we feel you deserve.  You will note that the calendar groups together  your maintenance  medications that are timed at particular times of the day.  Think of this as your checklist for what your doctor has instructed you to do until your next evaluation to see what benefit  there is  to staying on a consistent group of medications intended to keep you well.  The other group at the bottom is entirely up to you to use as you see fit  for specific symptoms that may arise between visits that require you to treat them on an as needed basis.  Think of this as your action plan or "what if" list.   Separating the top medications from the bottom group is fundamental to providing you adequate care going forward.

## 2017-11-26 NOTE — Assessment & Plan Note (Signed)
Sinus CT ordered   Improved with rx with augmentin but still am sputum > rec sinus CT then ? ent eval / hold further abx for now

## 2017-11-26 NOTE — Assessment & Plan Note (Signed)
Positive transbronchial biopsy 02/19/91 by Dr. Unice Cobble  -Daily prednisone since 2007 with ? adrenal insufficiency iatrogenic > off completely Feb 2012 with no recurrence - PFT's 06/26/08 FEV1  1.09 ratio  - PFTs  03/02/10 FEV1  1.06 (42%) ratio 34 and no better p B2,  DLC0 48% corrects to 64% - PFT's 08/01/2011 FEV1 1.6 (42%)  41 ratio  DLCO 55 corrects 77 - PFTs 10/10/16    FEV1  0.87(38)ratio 41 and DLCO 36/39c corrects to 51 for alv vol > placed back on daily pred  - 02/20/2017 decreased pred to 5 mg daily > to 2.5mg  daily as of 07/17/2017  - 07/17/2017 try 2.5 mg qod > 09/28/2017 changed to 20 mg until better and new floor of 5 mg daily plus consider plaquenil due to new skin involvement on face  - opth eval neg 11/10/17 - Start plaquenil 200 mg daily 11/26/2017 and ceiling 10 / floor 5 mg daily   Discussed pathophysiology and The goal with a chronic steroid dependent illness is always arriving at the lowest effective dose that controls the disease/symptoms and not accepting a set "formula" which is based on statistics or guidelines that don't always take into account patient  variability or the natural hx of the dz in every individual patient, which may well vary over time.  For now therefore I recommend the patient maintain  Ceiling of 10 and floor of 5 mg daily while waiting on plaquenil addition to see to what extent can minimize pred to physiologic levels

## 2017-11-27 ENCOUNTER — Encounter (HOSPITAL_COMMUNITY): Payer: BLUE CROSS/BLUE SHIELD

## 2017-11-29 ENCOUNTER — Encounter (HOSPITAL_COMMUNITY): Payer: BLUE CROSS/BLUE SHIELD

## 2017-12-04 ENCOUNTER — Encounter (HOSPITAL_COMMUNITY)
Admission: RE | Admit: 2017-12-04 | Discharge: 2017-12-04 | Disposition: A | Discharge status: Home / Self Care | Payer: BLUE CROSS/BLUE SHIELD | Source: Ambulatory Visit

## 2017-12-04 ENCOUNTER — Encounter (HOSPITAL_COMMUNITY): Payer: BLUE CROSS/BLUE SHIELD

## 2017-12-04 DIAGNOSIS — D860 Sarcoidosis of lung: Secondary | ICD-10-CM

## 2017-12-04 NOTE — Progress Notes (Signed)
Pulmonary Individual Treatment Plan  Patient Details  Name: [name] MRN: [id number] Date of Birth: 1961/04/06 Referring Provider:     Pulmonary Rehab Walk Test from 06/26/2017 in [place]  Referring Provider  Dr. Melvyn Novas      Initial Encounter Date:    Pulmonary Rehab Walk Test from 06/26/2017 in [place]  Date  06/26/17  Referring Provider  Dr. Melvyn Novas      Visit Diagnosis: Sarcoidosis of lung ([place])  Patient's Home Medications on Admission:   Current Outpatient Medications:  .  acetaminophen (TYLENOL) 500 MG tablet, Per bottle as needed, Disp: , Rfl:  .  albuterol (VENTOLIN HFA) 108 (90 Base) MCG/ACT inhaler, Inhale 2 puffs into the lungs every 6 (six) hours as needed for wheezing or shortness of breath., Disp: 1 Inhaler, Rfl: 2 .  anastrozole (ARIMIDEX) 1 MG tablet, Take 1 mg by mouth daily., Disp: , Rfl:  .  aspirin EC 81 MG tablet, Take 81 mg by mouth daily., Disp: , Rfl:  .  atorvastatin (LIPITOR) 20 MG tablet, Take 1 tablet (20 mg total) by mouth daily., Disp: 90 tablet, Rfl: 3 .  famotidine (PEPCID) 20 MG tablet, Take 20 mg by mouth at bedtime., Disp: , Rfl:  .  fluticasone (FLONASE) 50 MCG/ACT nasal spray, Place 2 sprays into both nostrils daily as needed for allergies., Disp: 16 g, Rfl: 11 .  gabapentin (NEURONTIN) 300 MG capsule, 2 at bedtime, Disp: , Rfl:  .  guaiFENesin (MUCINEX) 600 MG 12 hr tablet, Take 600 mg by mouth 2 (two) times daily as needed., Disp: , Rfl:  .  hydroxychloroquine (PLAQUENIL) 200 MG tablet, Take 1 tablet (200 mg total) by mouth daily., Disp: 30 tablet, Rfl: 11 .  metoprolol tartrate (LOPRESSOR) 25 MG tablet, Take 1 tablet (25 mg total) by mouth 2 (two) times daily., Disp: 60 tablet, Rfl: 11 .  Multiple Vitamin (MULTIVITAMIN) capsule, Take 1 capsule by mouth daily., Disp: , Rfl:  .  omeprazole (PRILOSEC) 20 MG capsule, TAKE 1 CAPSULE BY MOUTH ONCE DAILY, Disp: 30 capsule, Rfl: 5 .   ondansetron (ZOFRAN) 4 MG tablet, Take 1 tablet (4 mg total) by mouth every 8 (eight) hours as needed for nausea or vomiting., Disp: 10 tablet, Rfl: 0 .  OXYGEN, 2lpm with rest and 3-4 lpm with exertion, Disp: , Rfl:  .  predniSONE (DELTASONE) 5 MG tablet, Take 10 mg by mouth daily with breakfast. , Disp: , Rfl:  .  sertraline (ZOLOFT) 50 MG tablet, Take 1 tablet (50 mg total) by mouth daily., Disp: 90 tablet, Rfl: 0 .  Tiotropium Bromide-Olodaterol (STIOLTO RESPIMAT) 2.5-2.5 MCG/ACT AERS, Inhale 2 puffs into the lungs daily., Disp: 1 Inhaler, Rfl: 11 .  triamterene-hydrochlorothiazide (MAXZIDE-25) 37.5-25 MG tablet, TAKE ONE TABLET BY MOUTH ONCE DAILY, Disp: 30 tablet, Rfl: 11  Past Medical History: Past Medical History:  Diagnosis Date  . Allergic rhinitis   . Breast cancer ([place]) 2016   right breast  . COPD, mild ([place]) FOLLOWED BY DR Melvyn Novas  . GERD (gastroesophageal reflux disease)   . HTN (hypertension)   . Hypertension   . Iron deficiency anemia   . Long-term current use of steroids SYMBICORT INHALER  . 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 Personal history of radiation therapy 2016  . Sarcoidosis STABLE PER CXR JUNE 2013  . Shortness of breath   . Sickle cell trait ([place])     Tobacco Use: Social History  Tobacco Use  Smoking Status Former Smoker  . Packs/day: 1.00  . Years: 20.00  . Pack years: 20.00  . Last attempt to quit: 11/14/2003  . Years since quitting: 14.0  Smokeless Tobacco Never Used    Labs: Recent Review Flowsheet Data    Labs for ITP Cardiac and Pulmonary Rehab Latest Ref Rng & Units 12/20/2012 09/10/2013 02/04/2015 08/13/2015 02/05/2017   Cholestrol 100 - 199 mg/dL 187 - 178 - 247(H)   LDLCALC 0 - 99 mg/dL 106(H) - 118(H) - 131(H)   HDL >39 mg/dL 53 - 43(L) - 80   Trlycerides 0 - 149 mg/dL 139 - 84 - 178(H)   Hemoglobin A1c - - 5.9 5.9 - -   TCO2 0 - 100 mmol/L - - - 30 -      Capillary Blood Glucose: Lab Results  Component Value Date   GLUCAP 83 07/19/2012      Pulmonary Assessment Scores:   Pulmonary Function Assessment: Pulmonary Function Assessment - 06/18/17 1436      Breath   Bilateral Breath Sounds  Clear;Decreased    Shortness of Breath  Yes;Limiting activity       Exercise Target Goals:    Exercise Program Goal: Individual exercise prescription set using results from initial 6 min walk test and THRR while considering  patient's activity barriers and safety.    Exercise Prescription Goal: Initial exercise prescription builds to 30-45 minutes a day of aerobic activity, 2-3 days per week.  Home exercise guidelines will be given to patient during program as part of exercise prescription that the participant will acknowledge.  Activity Barriers & Risk Stratification:   6 Minute Walk: 6 Minute Walk    Row Name 06/26/17 1634         6 Minute Walk   Phase  Initial     Distance  1037 feet     Walk Time  5 minutes     # of Rest Breaks  1 1 minute     MPH  1.96     METS  2.53     RPE  11     Perceived Dyspnea   2     Symptoms  No     Resting HR  78 bpm     Resting BP  120/79     Max Ex. HR  118 bpm     Max Ex. BP  175/78     2 Minute Post BP  122/85       Interval HR   Baseline HR (retired)  78     1 Minute HR  97     2 Minute HR  106     3 Minute HR  86     4 Minute HR  101     5 Minute HR  118     6 Minute HR  118     2 Minute Post HR  88     Interval Heart Rate?  Yes       Interval Oxygen   Interval Oxygen?  Yes     Baseline Oxygen Saturation %  94 %     Resting Liters of Oxygen  2 L     1 Minute Oxygen Saturation %  91 %     1 Minute Liters of Oxygen  2 L     2 Minute Oxygen Saturation %  87 %     2 Minute Liters of Oxygen  2 L     3  Minute Oxygen Saturation %  93 %     3 Minute Liters of Oxygen  3 L     4 Minute Oxygen Saturation %  92 %     4 Minute Liters of Oxygen  3 L     5 Minute Oxygen Saturation %  89 %     5 Minute Liters of Oxygen  3 L     6 Minute Oxygen Saturation %  89 %     6  Minute Liters of Oxygen  3 L     2 Minute Post Oxygen Saturation %  96 %     2 Minute Post Liters of Oxygen  3 L        Oxygen Initial Assessment: Oxygen Initial Assessment - 06/26/17 1633      Initial 6 min Walk   Oxygen Used  Continuous;E-Tanks    Liters per minute  3    Resting Oxygen Saturation   93 %    Exercise Oxygen Saturation  during 6 min walk  87 % 87 on 2 liters, titrated to 3 liters      Program Oxygen Prescription   Program Oxygen Prescription  Continuous;E-Tanks    Liters per minute  3       Oxygen Re-Evaluation: Oxygen Re-Evaluation    Row Name 07/11/17 1713 08/03/17 1150 08/31/17 1200 09/24/17 1207 10/16/17 1410     Program Oxygen Prescription   Program Oxygen Prescription  Continuous;E-Tanks  Continuous;E-Tanks  Continuous;E-Tanks  Continuous;E-Tanks  Continuous;E-Tanks   Liters per minute  _0 Home Oxygen   Home Oxygen Device  Portable Concentrator;Home Concentrator  Portable Concentrator;Home Concentrator  Portable Concentrator;Home Concentrator  Portable Concentrator;Home Concentrator  Portable Concentrator;Home Concentrator   Sleep Oxygen Prescription  Continuous  Continuous  Continuous  Continuous  Continuous   Liters per minute  _1 Home Exercise Oxygen Prescription  Continuous  Continuous  Continuous  Continuous  Continuous   Liters per minute  _2 Home at Rest Exercise Oxygen Prescription  Continuous  Continuous  Continuous  Continuous  Continuous   Liters per minute  _3 Compliance with Home Oxygen Use  Yes  Yes  Yes  Yes  Yes     Goals/Expected Outcomes   Short Term Goals  To learn and exhibit compliance with exercise, home and travel O2 prescription;To learn and understand importance of monitoring SPO2 with pulse oximeter and demonstrate accurate use of the pulse oximeter.;To learn and understand importance of maintaining oxygen saturations>88%;To learn and demonstrate proper pursed lip breathing  techniques or other breathing techniques.;To learn and demonstrate proper use of respiratory medications  To learn and exhibit compliance with exercise, home and travel O2 prescription;To learn and understand importance of monitoring SPO2 with pulse oximeter and demonstrate accurate use of the pulse oximeter.;To learn and understand importance of maintaining oxygen saturations>88%;To learn and demonstrate proper pursed lip breathing techniques or other breathing techniques.;To learn and demonstrate proper use of respiratory medications  To learn and exhibit compliance with exercise, home and travel O2 prescription;To learn and understand importance of monitoring SPO2 with pulse oximeter and demonstrate accurate use of the pulse oximeter.;To learn and understand importance of maintaining oxygen saturations>88%;To learn and demonstrate proper pursed lip breathing techniques or other breathing techniques.;To learn and demonstrate  proper use of respiratory medications  To learn and exhibit compliance with exercise, home and travel O2 prescription;To learn and understand importance of monitoring SPO2 with pulse oximeter and demonstrate accurate use of the pulse oximeter.;To learn and understand importance of maintaining oxygen saturations>88%;To learn and demonstrate proper pursed lip breathing techniques or other breathing techniques.;To learn and demonstrate proper use of respiratory medications  To learn and exhibit compliance with exercise, home and travel O2 prescription;To learn and understand importance of monitoring SPO2 with pulse oximeter and demonstrate accurate use of the pulse oximeter.;To learn and understand importance of maintaining oxygen saturations>88%;To learn and demonstrate proper pursed lip breathing techniques or other breathing techniques.;To learn and demonstrate proper use of respiratory medications   Long  Term Goals  Exhibits compliance with exercise, home and travel O2  prescription;Verbalizes importance of monitoring SPO2 with pulse oximeter and return demonstration;Maintenance of O2 saturations>88%;Exhibits proper breathing techniques, such as pursed lip breathing or other method taught during program session;Compliance with respiratory medication;Demonstrates proper use of MDI's  Exhibits compliance with exercise, home and travel O2 prescription;Verbalizes importance of monitoring SPO2 with pulse oximeter and return demonstration;Maintenance of O2 saturations>88%;Exhibits proper breathing techniques, such as pursed lip breathing or other method taught during program session;Compliance with respiratory medication;Demonstrates proper use of MDI's  Exhibits compliance with exercise, home and travel O2 prescription;Verbalizes importance of monitoring SPO2 with pulse oximeter and return demonstration;Maintenance of O2 saturations>88%;Exhibits proper breathing techniques, such as pursed lip breathing or other method taught during program session;Compliance with respiratory medication;Demonstrates proper use of MDI's  Exhibits compliance with exercise, home and travel O2 prescription;Verbalizes importance of monitoring SPO2 with pulse oximeter and return demonstration;Maintenance of O2 saturations>88%;Exhibits proper breathing techniques, such as pursed lip breathing or other method taught during program session;Compliance with respiratory medication;Demonstrates proper use of MDI's  Exhibits compliance with exercise, home and travel O2 prescription;Verbalizes importance of monitoring SPO2 with pulse oximeter and return demonstration;Maintenance of O2 saturations>88%;Exhibits proper breathing techniques, such as pursed lip breathing or other method taught during program session;Compliance with respiratory medication;Demonstrates proper use of MDI's   Goals/Expected Outcomes  -  patient compliant with home oxygen requirements and meets goals as stated above  patient compliant with  home oxygen requirements and meets goals as stated above  patient compliant with home oxygen requirements and meets goals as stated above  patient compliant with home oxygen requirements and meets goals as stated above   Row Name 11/08/17 0806 12/03/17 1608           Program Oxygen Prescription   Program Oxygen Prescription  Continuous;E-Tanks  Continuous;E-Tanks      Liters per minute  3  3        Home Oxygen   Home Oxygen Device  Portable Concentrator;Home Concentrator  Portable Concentrator;Home Concentrator      Sleep Oxygen Prescription  Continuous  Continuous      Liters per minute  2  2      Home Exercise Oxygen Prescription  Continuous  Continuous      Liters per minute  3  3      Home at Rest Exercise Oxygen Prescription  Continuous  Continuous      Liters per minute  2  2      Compliance with Home Oxygen Use  Yes  Yes        Goals/Expected Outcomes   Short Term Goals  To learn and exhibit compliance with exercise, home and travel O2 prescription;To learn and understand importance of  monitoring SPO2 with pulse oximeter and demonstrate accurate use of the pulse oximeter.;To learn and understand importance of maintaining oxygen saturations>88%;To learn and demonstrate proper pursed lip breathing techniques or other breathing techniques.;To learn and demonstrate proper use of respiratory medications  To learn and exhibit compliance with exercise, home and travel O2 prescription;To learn and understand importance of monitoring SPO2 with pulse oximeter and demonstrate accurate use of the pulse oximeter.;To learn and understand importance of maintaining oxygen saturations>88%;To learn and demonstrate proper pursed lip breathing techniques or other breathing techniques.;To learn and demonstrate proper use of respiratory medications      Long  Term Goals  Exhibits compliance with exercise, home and travel O2 prescription;Verbalizes importance of monitoring SPO2 with pulse oximeter and  return demonstration;Maintenance of O2 saturations>88%;Exhibits proper breathing techniques, such as pursed lip breathing or other method taught during program session;Compliance with respiratory medication;Demonstrates proper use of MDI's  Exhibits compliance with exercise, home and travel O2 prescription;Verbalizes importance of monitoring SPO2 with pulse oximeter and return demonstration;Maintenance of O2 saturations>88%;Exhibits proper breathing techniques, such as pursed lip breathing or other method taught during program session;Compliance with respiratory medication;Demonstrates proper use of MDI's      Goals/Expected Outcomes  patient compliant with home oxygen requirements and meets goals as stated above  patient compliant with home oxygen requirements and meets goals as stated above         Oxygen Discharge (Final Oxygen Re-Evaluation): Oxygen Re-Evaluation - 12/03/17 1608      Program Oxygen Prescription   Program Oxygen Prescription  Continuous;E-Tanks    Liters per minute  3      Home Oxygen   Home Oxygen Device  Portable Concentrator;Home Concentrator    Sleep Oxygen Prescription  Continuous    Liters per minute  2    Home Exercise Oxygen Prescription  Continuous    Liters per minute  3    Home at Rest Exercise Oxygen Prescription  Continuous    Liters per minute  2    Compliance with Home Oxygen Use  Yes      Goals/Expected Outcomes   Short Term Goals  To learn and exhibit compliance with exercise, home and travel O2 prescription;To learn and understand importance of monitoring SPO2 with pulse oximeter and demonstrate accurate use of the pulse oximeter.;To learn and understand importance of maintaining oxygen saturations>88%;To learn and demonstrate proper pursed lip breathing techniques or other breathing techniques.;To learn and demonstrate proper use of respiratory medications    Long  Term Goals  Exhibits compliance with exercise, home and travel O2  prescription;Verbalizes importance of monitoring SPO2 with pulse oximeter and return demonstration;Maintenance of O2 saturations>88%;Exhibits proper breathing techniques, such as pursed lip breathing or other method taught during program session;Compliance with respiratory medication;Demonstrates proper use of MDI's    Goals/Expected Outcomes  patient compliant with home oxygen requirements and meets goals as stated above       Initial Exercise Prescription: Initial Exercise Prescription - 06/26/17 1600      Date of Initial Exercise RX and Referring Provider   Date  06/26/17    Referring Provider  Dr. Melvyn Novas      Oxygen   Oxygen  Continuous    Liters  3      Bike   Level  0.5    Minutes  17      NuStep   Level  2    Minutes  17    METs  1.4      Track  Laps  5    Minutes  17      Prescription Details   Frequency (times per week)  2    Duration  Progress to 45 minutes of aerobic exercise without signs/symptoms of physical distress      Intensity   THRR 40-80% of Max Heartrate  65-131    Ratings of Perceived Exertion  11-13    Perceived Dyspnea  0-4      Progression   Progression  Continue progressive overload as per policy without signs/symptoms or physical distress.      Resistance Training   Training Prescription  Yes    Weight  blue bands    Reps  10-15       Perform Capillary Blood Glucose checks as needed.  Exercise Prescription Changes: Exercise Prescription Changes    Row Name 07/03/17 1200 07/05/17 1200 07/10/17 1200 07/12/17 1200 07/24/17 1200     Response to Exercise   Blood Pressure (Admit)  118/68  138/80  110/68  132/60  122/62   Blood Pressure (Exercise)  126/72  134/84  132/68  130/70  126/82   Blood Pressure (Exit)  114/70  110/70  104/60  116/70  102/64   Heart Rate (Admit)  72 bpm  65 bpm  78 bpm  67 bpm  78 bpm   Heart Rate (Exercise)  104 bpm  104 bpm  108 bpm  95 bpm  98 bpm   Heart Rate (Exit)  75 bpm  80 bpm  71 bpm  67 bpm  85 bpm    Oxygen Saturation (Admit)  92 %  96 %  98 %  98 %  100 %   Oxygen Saturation (Exercise)  89 %  90 %  87 %  89 %  96 %   Oxygen Saturation (Exit)  99 %  98 %  98 %  99 %  97 %   Rating of Perceived Exertion (Exercise)  _0 Perceived Dyspnea (Exercise)  _1 Duration  Progress to 45 minutes of aerobic exercise without signs/symptoms of physical distress  Progress to 45 minutes of aerobic exercise without signs/symptoms of physical distress  Progress to 45 minutes of aerobic exercise without signs/symptoms of physical distress  Progress to 45 minutes of aerobic exercise without signs/symptoms of physical distress  Progress to 45 minutes of aerobic exercise without signs/symptoms of physical distress   Intensity  Other (comment) 40-80% of HRR  Other (comment) 40-80% of HRR  Other (comment) 40-80% of HRR  THRR unchanged  THRR unchanged     Progression   Progression  Continue to progress workloads to maintain intensity without signs/symptoms of physical distress.  Continue to progress workloads to maintain intensity without signs/symptoms of physical distress.  Continue to progress workloads to maintain intensity without signs/symptoms of physical distress.  Continue to progress workloads to maintain intensity without signs/symptoms of physical distress.  Continue to progress workloads to maintain intensity without signs/symptoms of physical distress.     Resistance Training   Training Prescription  Yes  Yes  Yes  Yes  Yes   Weight  blue bands  blue bands  blue bands  blue bands  blue bands   Reps  10-15  10-15  10-15  10-15  10-15   Time  10 Minutes  10 Minutes  10 Minutes  10 Minutes  10 Minutes  Oxygen   Oxygen  Continuous  Continuous  Continuous  Continuous  Continuous   Liters  _0 Bike   Level  0.5  -  0.5  0.5  0.5   Minutes  17  -  _1 NuStep   Level  _2 -  4   Minutes  _3 -  17   METs  1.9  2.1  2.1  -  2.1      Track   Laps  _4 -   Minutes  _5 -   Row Name 07/26/17 1200 07/31/17 1600 08/02/17 1200 08/07/17 1200 08/09/17 1200     Response to Exercise   Blood Pressure (Admit)  146/76  102/70  122/76  116/62  116/64   Blood Pressure (Exercise)  150/76  136/74  130/64  122/82  108/70   Blood Pressure (Exit)  122/80  120/70  112/70  114/75  104/68   Heart Rate (Admit)  78 bpm  85 bpm  80 bpm  77 bpm  77 bpm   Heart Rate (Exercise)  107 bpm  93 bpm  110 bpm  105 bpm  94 bpm   Heart Rate (Exit)  90 bpm  82 bpm  81 bpm  81 bpm  73 bpm   Oxygen Saturation (Admit)  93 %  96 %  96 %  95 %  97 %   Oxygen Saturation (Exercise)  88 %  88 %  91 %  90 %  93 %   Oxygen Saturation (Exit)  97 %  97 %  98 %  97 %  98 %   Rating of Perceived Exertion (Exercise)  _6 Perceived Dyspnea (Exercise)  _7 Duration  Progress to 45 minutes of aerobic exercise without signs/symptoms of physical distress  Progress to 45 minutes of aerobic exercise without signs/symptoms of physical distress  Progress to 45 minutes of aerobic exercise without signs/symptoms of physical distress  Progress to 45 minutes of aerobic exercise without signs/symptoms of physical distress  Progress to 45 minutes of aerobic exercise without signs/symptoms of physical distress   Intensity  THRR unchanged  THRR unchanged  THRR unchanged  THRR unchanged  THRR unchanged     Progression   Progression  Continue to progress workloads to maintain intensity without signs/symptoms of physical distress.  Continue to progress workloads to maintain intensity without signs/symptoms of physical distress.  Continue to progress workloads to maintain intensity without signs/symptoms of physical distress.  Continue to progress workloads to maintain intensity without signs/symptoms of physical distress.  Continue to progress workloads to maintain intensity without signs/symptoms of physical distress.     Resistance Training    Training Prescription  Yes  Yes  Yes  Yes  Yes   Weight  blue bands  b;ue bands  b;ue bands  b;ue bands  b;ue bands   Reps  10-15  10-15  10-15  10-15  10-15   Time  10 Minutes  10 Minutes  10 Minutes  10 Minutes  10 Minutes     Oxygen   Oxygen  Continuous  -  Continuous  Continuous  Continuous  Liters  3  -  _0 Bike   Level  0.5  0.5  0.5  0.5  -   Minutes  _1 -     NuStep   Level  4  4  -  5  5   Minutes  17  17  -  17  17   METs  1.9  1.9  -  2.1  2     Track   Laps  9  -  _2 Minutes  17  -  _3 Row Name 08/14/17 1200 08/21/17 1300 08/23/17 1200 08/28/17 1200 08/30/17 1200     Response to Exercise   Blood Pressure (Admit)  118/62  142/84  118/80  122/74  116/66   Blood Pressure (Exercise)  138/80  142/84  102/60  112/70  120/70   Blood Pressure (Exit)  100/60  108/72  125/77  106/64  104/70   Heart Rate (Admit)  84 bpm  75 bpm  80 bpm  80 bpm  83 bpm   Heart Rate (Exercise)  102 bpm  103 bpm  104 bpm  111 bpm  99 bpm   Heart Rate (Exit)  89 bpm  81 bpm  88 bpm  90 bpm  88 bpm   Oxygen Saturation (Admit)  94 %  93 %  96 %  95 %  97 %   Oxygen Saturation (Exercise)  89 %  92 %  87 %  92 %  93 %   Oxygen Saturation (Exit)  98 %  99 %  96 %  98 %  99 %   Rating of Perceived Exertion (Exercise)  _4 Perceived Dyspnea (Exercise)  _5 Duration  Progress to 45 minutes of aerobic exercise without signs/symptoms of physical distress  Progress to 45 minutes of aerobic exercise without signs/symptoms of physical distress  Progress to 45 minutes of aerobic exercise without signs/symptoms of physical distress  Progress to 45 minutes of aerobic exercise without signs/symptoms of physical distress  Progress to 45 minutes of aerobic exercise without signs/symptoms of physical distress   Intensity  THRR unchanged  THRR unchanged  THRR unchanged  THRR unchanged  THRR unchanged     Progression   Progression  Continue to  progress workloads to maintain intensity without signs/symptoms of physical distress.  Continue to progress workloads to maintain intensity without signs/symptoms of physical distress.  Continue to progress workloads to maintain intensity without signs/symptoms of physical distress.  Continue to progress workloads to maintain intensity without signs/symptoms of physical distress.  Continue to progress workloads to maintain intensity without signs/symptoms of physical distress.     Resistance Training   Training Prescription  Yes  Yes  Yes  Yes  Yes   Weight  b;ue bands  b;ue bands  b;ue bands  b;ue bands  blue bands   Reps  10-15  10-15  10-15  10-15  10-15   Time  10 Minutes  10 Minutes  10 Minutes  10 Minutes  10 Minutes     Oxygen   Oxygen  Continuous  Continuous  Continuous  Continuous  Continuous   Liters  _6 Bike  Level  0.5  -  0.5  0.8  0.8   Minutes  17  -  _0 NuStep   Level  _1 Minutes  _2 METs  2  1.9  2.1  1.9  1.8     Track   Laps  _3 -   Minutes  _4 -     Home Exercise Plan   Plans to continue exercise at  Home (comment)  -  -  -  -   Frequency  Add 3 additional days to program exercise sessions.  -  -  -  -   Row Name 09/04/17 1100 09/06/17 1200 10/09/17 1200 10/11/17 1300 10/18/17 1200     Response to Exercise   Blood Pressure (Admit)  102/60  118/60  112/64  104/66  110/60   Blood Pressure (Exercise)  104/70  120/76  96/64  128/64  134/80   Blood Pressure (Exit)  114/60  114/60  110/60  108/64  100/64   Heart Rate (Admit)  87 bpm  84 bpm  72 bpm  71 bpm  87 bpm   Heart Rate (Exercise)  104 bpm  110 bpm  112 bpm  93 bpm  94 bpm   Heart Rate (Exit)  83 bpm  86 bpm  81 bpm  69 bpm  88 bpm   Oxygen Saturation (Admit)  96 %  95 %  94 %  96 %  94 %   Oxygen Saturation (Exercise)  90 %  92 %  88 %  93 %  94 %   Oxygen Saturation (Exit)  98 %  97 %  99 %  95 %  99 %   Rating of  Perceived Exertion (Exercise)  _5 12.5   Perceived Dyspnea (Exercise)  _6 Duration  Progress to 45 minutes of aerobic exercise without signs/symptoms of physical distress  Progress to 45 minutes of aerobic exercise without signs/symptoms of physical distress  Progress to 45 minutes of aerobic exercise without signs/symptoms of physical distress  Progress to 45 minutes of aerobic exercise without signs/symptoms of physical distress  Progress to 45 minutes of aerobic exercise without signs/symptoms of physical distress   Intensity  THRR unchanged  THRR unchanged  THRR unchanged  THRR unchanged  THRR unchanged     Progression   Progression  Continue to progress workloads to maintain intensity without signs/symptoms of physical distress.  Continue to progress workloads to maintain intensity without signs/symptoms of physical distress.  Continue to progress workloads to maintain intensity without signs/symptoms of physical distress.  Continue to progress workloads to maintain intensity without signs/symptoms of physical distress.  Continue to progress workloads to maintain intensity without signs/symptoms of physical distress.     Resistance Training   Training Prescription  Yes  Yes  Yes  Yes  Yes   Weight  blue bands  blue bands  blue bands  blue bands  blue bands   Reps  10-15  10-15  10-15  10-15  10-15   Time  10 Minutes  10 Minutes  10 Minutes  10 Minutes  10 Minutes     Oxygen   Oxygen  Continuous  Continuous  Continuous  Continuous  Continuous   Liters  _0 2-3  2-3     Bike   Level  1  1  0.8  0.8  0.8   Minutes  _1 NuStep   Level  6  -  _2 Minutes  17  -  _3 METs  1.9  -  2.3  2.1  2     Track   Laps  _4 -  -   Minutes  _5 -  -   Row Name 10/30/17 1200 11/22/17 1200           Response to Exercise   Blood Pressure (Admit)  110/66  100/56      Blood Pressure (Exercise)  100/62  140/76       Blood Pressure (Exit)  102/60  118/78      Heart Rate (Admit)  71 bpm  72 bpm      Heart Rate (Exercise)  93 bpm  104 bpm      Heart Rate (Exit)  67 bpm  75 bpm      Oxygen Saturation (Admit)  94 %  95 %      Oxygen Saturation (Exercise)  93 %  89 %      Oxygen Saturation (Exit)  96 %  97 %      Rating of Perceived Exertion (Exercise)  13  13      Perceived Dyspnea (Exercise)  3  2      Duration  Progress to 45 minutes of aerobic exercise without signs/symptoms of physical distress  Progress to 45 minutes of aerobic exercise without signs/symptoms of physical distress      Intensity  THRR unchanged  THRR unchanged        Progression   Progression  Continue to progress workloads to maintain intensity without signs/symptoms of physical distress.  Continue to progress workloads to maintain intensity without signs/symptoms of physical distress.        Resistance Training   Training Prescription  Yes  Yes      Weight  blue bands  blue bands      Reps  10-15  10-15      Time  10 Minutes  10 Minutes        Oxygen   Oxygen  Continuous  Continuous      Liters  2-3  2-3        Bike   Level  0.8  0.8      Minutes  17  17        NuStep   Level  7  -      Minutes  17  -      METs  2.1  -        Track   Laps  8  10      Minutes  17  17         Exercise Comments: Exercise Comments    Row Name 08/14/17 1651           Exercise Comments  home exercise completed          Exercise Goals and Review: Exercise Goals    Row Name 06/18/17 1420  Exercise Goals   Increase Physical Activity  Yes       Intervention  Provide advice, education, support and counseling about physical activity/exercise needs.;Develop an individualized exercise prescription for aerobic and resistive training based on initial evaluation findings, risk stratification, comorbidities and participant's personal goals.       Expected Outcomes  Achievement of increased cardiorespiratory fitness and  enhanced flexibility, muscular endurance and strength shown through measurements of functional capacity and personal statement of participant.       Increase Strength and Stamina  Yes       Intervention  Provide advice, education, support and counseling about physical activity/exercise needs.;Develop an individualized exercise prescription for aerobic and resistive training based on initial evaluation findings, risk stratification, comorbidities and participant's personal goals.       Expected Outcomes  Achievement of increased cardiorespiratory fitness and enhanced flexibility, muscular endurance and strength shown through measurements of functional capacity and personal statement of participant.          Exercise Goals Re-Evaluation : Exercise Goals Re-Evaluation    Row Name 07/10/17 2025 07/30/17 1214 09/03/17 0803 09/27/17 1636 10/16/17 0752     Exercise Goal Re-Evaluation   Exercise Goals Review  Increase Physical Activity;Increase Strength and Stamina  Increase Physical Activity;Increase Strength and Stamina;Able to understand and use Dyspnea scale;Able to understand and use rate of perceived exertion (RPE) scale;Knowledge and understanding of Target Heart Rate Range (THRR);Understanding of Exercise Prescription  Increase Physical Activity;Increase Strength and Stamina;Able to understand and use Dyspnea scale;Able to understand and use rate of perceived exertion (RPE) scale;Knowledge and understanding of Target Heart Rate Range (THRR);Understanding of Exercise Prescription  Increase Strength and Stamina;Increase Physical Activity;Able to understand and use Dyspnea scale;Able to understand and use rate of perceived exertion (RPE) scale;Knowledge and understanding of Target Heart Rate Range (THRR);Understanding of Exercise Prescription  Increase Strength and Stamina;Increase Physical Activity;Able to understand and use Dyspnea scale;Able to understand and use rate of perceived exertion (RPE)  scale;Knowledge and understanding of Target Heart Rate Range (THRR);Understanding of Exercise Prescription   Comments  Patient has only attended two exercise sessions. Will cont. to monitor and progress as able.   Patient is slowly progressing in program. Very excited when lost 1.1 pounds during last session. Very proud of her achievement. States that she is watching her diet. Will complete home exercise.    Patient is progressing well in program. Attendance weighs heavily on getting a ride to rehab. Very motivated to make changes. Is exercising at home. Will cont. to monitor and progress as able.   Patient has been out due to chest pain. Performed stress test-all was normal. Patient will return to rehab next week.   Patient has been approved to come back to rehab post stress test. Patient has attended two sessions. Is showing initiative and motivation. Will cont. to monitor and progress as able.    Expected Outcomes  Through exercise and education at rehab and at home, patient will increase strength and stamina. Patient will also gain a better understanding of the need for physical activity on a daily basis and the effects it can have on quality of life.  Through exercise and education at rehab and at home, patient will increase strength and stamina. Patient will also gain a better understanding of the need for physical activity on a daily basis and the effects it can have on quality of life.  Through exercise and education at rehab and at home, patient will increase strength and stamina. Patient  will also gain a better understanding of the need for physical activity on a daily basis and the effects it can have on quality of life.  Through exercise and education at rehab and at home, patient will increase strength and stamina. Patient will also gain a better understanding of the need for physical activity on a daily basis and the effects it can have on quality of life.  Through exercise and education at rehab and  at home, patient will increase strength and stamina. Patient will also gain a better understanding of the need for physical activity on a daily basis and the effects it can have on quality of life.   [place] Name 11/01/17 8675 12/03/17 1609           Exercise Goal Re-Evaluation   Exercise Goals Review  Increase Physical Activity;Increase Strength and Stamina;Able to understand and use Dyspnea scale;Able to understand and use rate of perceived exertion (RPE) scale;Understanding of Exercise Prescription;Knowledge and understanding of Target Heart Rate Range (THRR)  Increase Physical Activity;Increase Strength and Stamina;Able to understand and use Dyspnea scale;Able to understand and use rate of perceived exertion (RPE) scale;Understanding of Exercise Prescription;Knowledge and understanding of Target Heart Rate Range (THRR)      Comments  Patient states that she feels deconditioned due to her length of absense from rehab. Transportation is still an issue. Is not exercising at home. Will re-encourage. Will cont. to monitor and motivate.   Patient states that she feels deconditioned due to her length of absense from rehab. Transportation is still an issue. Is not exercising at home. Will re-encourage. Will cont. to monitor and motivate.       Expected Outcomes  Through exercise and education at rehab and at home, patient will increase strength and stamina. Patient will also gain a better understanding of the need for physical activity on a daily basis and the effects it can have on quality of life.  Through exercise and education at rehab and at home, patient will increase strength and stamina. Patient will also gain a better understanding of the need for physical activity on a daily basis and the effects it can have on quality of life.         Discharge Exercise Prescription (Final Exercise Prescription Changes): Exercise Prescription Changes - 11/22/17 1200      Response to Exercise   Blood Pressure  (Admit)  100/56    Blood Pressure (Exercise)  140/76    Blood Pressure (Exit)  118/78    Heart Rate (Admit)  72 bpm    Heart Rate (Exercise)  104 bpm    Heart Rate (Exit)  75 bpm    Oxygen Saturation (Admit)  95 %    Oxygen Saturation (Exercise)  89 %    Oxygen Saturation (Exit)  97 %    Rating of Perceived Exertion (Exercise)  13    Perceived Dyspnea (Exercise)  2    Duration  Progress to 45 minutes of aerobic exercise without signs/symptoms of physical distress    Intensity  THRR unchanged      Progression   Progression  Continue to progress workloads to maintain intensity without signs/symptoms of physical distress.      Resistance Training   Training Prescription  Yes    Weight  blue bands    Reps  10-15    Time  10 Minutes      Oxygen   Oxygen  Continuous    Liters  2-3  Bike   Level  0.8    Minutes  17      Track   Laps  10    Minutes  17       Nutrition:  Target Goals: Understanding of nutrition guidelines, daily intake of sodium '1500mg'$ , cholesterol '200mg'$ , calories 30% from fat and 7% or less from saturated fats, daily to have 5 or more servings of fruits and vegetables.  Biometrics: Pre Biometrics - 06/18/17 1447      Pre Biometrics   Grip Strength  29 kg        Nutrition Therapy Plan and Nutrition Goals: Nutrition Therapy & Goals - 07/09/17 1515      Nutrition Therapy   Diet  Therapeutic Lifestyle Changes      Personal Nutrition Goals   Nutrition Goal  Identify food quantities necessary to achieve wt loss of 1-2 lb per week to a goal wt loss of 2.7-10.9 kg (6-24 lb) at graduation from pulmonary rehab.    Personal Goal #2  Describe the benefit of including fruits, vegetables, whole grains, and low-fat dairy products in a healthy meal plan.      Intervention Plan   Intervention  Prescribe, educate and counsel regarding individualized specific dietary modifications aiming towards targeted core components such as weight, hypertension, lipid  management, diabetes, heart failure and other comorbidities.    Expected Outcomes  Short Term Goal: Understand basic principles of dietary content, such as calories, fat, sodium, cholesterol and nutrients.;Long Term Goal: Adherence to prescribed nutrition plan.       Nutrition Assessments: Nutrition Assessments - 07/09/17 1515      Rate Your Plate Scores   Pre Score  52       Nutrition Goals Re-Evaluation:   Nutrition Goals Discharge (Final Nutrition Goals Re-Evaluation):   Psychosocial: Target Goals: Acknowledge presence or absence of significant depression and/or stress, maximize coping skills, provide positive support system. Participant is able to verbalize types and ability to use techniques and skills needed for reducing stress and depression.  Initial Review & Psychosocial Screening: Initial Psych Review & Screening - 06/18/17 1438      Initial Review   Current issues with  Current Sleep Concerns sleep deprevation from hot flashes      Family Dynamics   Good Support System?  Yes    Comments  recent loss of mother      Barriers   Psychosocial barriers to participate in program  There are no identifiable barriers or psychosocial needs.      Screening Interventions   Interventions  Encouraged to exercise       Quality of Life Scores:  Scores of 19 and below usually indicate a poorer quality of life in these areas.  A difference of  2-3 points is a clinically meaningful difference.  A difference of 2-3 points in the total score of the Quality of Life Index has been associated with significant improvement in overall quality of life, self-image, physical symptoms, and general health in studies assessing change in quality of life.   PHQ-9: Recent Review Flowsheet Data    Depression screen [facility] 2/9 06/18/2017 04/12/2017 03/29/2017 03/13/2017 02/05/2017   Decreased Interest 0 0 0 0 0   Down, Depressed, Hopeless 0 0 0 0 0   PHQ - 2 Score 0 0 0 0 0     Interpretation of Total  Score  Total Score Depression Severity:  1-4 = Minimal depression, 5-9 = Mild depression, 10-14 = Moderate depression, 15-19 = Moderately  severe depression, 20-27 = Severe depression   Psychosocial Evaluation and Intervention: Psychosocial Evaluation - 06/18/17 1440      Psychosocial Evaluation & Interventions   Interventions  Encouraged to exercise with the program and follow exercise prescription    Expected Outcomes  patient will remain free from psychosocial barriers    Continue Psychosocial Services   Follow up required by staff       Psychosocial Re-Evaluation: Psychosocial Re-Evaluation    [place] Name 07/11/17 1716 08/03/17 1153 08/31/17 1209 09/24/17 1210 10/16/17 1414     Psychosocial Re-Evaluation   Current issues with  Current Sleep Concerns  Current Sleep Concerns  None Identified  None Identified  None Identified   Comments  recent loss of mother  recent loss of mother  sleep concerns have resolved  -  sleep concerns have resolved   Expected Outcomes  patient will remain free from psychosocial barriers to participation in pulmonary rehab  patient will remain free from psychosocial barriers to participation in pulmonary rehab  patient will remain free from psychosocial barriers to participation in pulmonary rehab  patient will remain free from psychosocial barriers to participation in pulmonary rehab  patient will remain free from psychosocial barriers to participation in pulmonary rehab   Interventions  Encouraged to attend Pulmonary Rehabilitation for the exercise  Encouraged to attend Pulmonary Rehabilitation for the exercise  Encouraged to attend Pulmonary Rehabilitation for the exercise  Encouraged to attend Pulmonary Rehabilitation for the exercise  Encouraged to attend Pulmonary Rehabilitation for the exercise   Continue Psychosocial Services   Follow up required by staff  Follow up required by staff  Follow up required by staff  Follow up required by staff  Follow up required  by staff   Row Name 11/08/17 0808 12/03/17 1510           Psychosocial Re-Evaluation   Current issues with  None Identified  None Identified      Comments  sleep concerns have resolved  -      Expected Outcomes  patient will remain free from psychosocial barriers to participation in pulmonary rehab  patient will remain free from psychosocial barriers to participation in pulmonary rehab      Interventions  Encouraged to attend Pulmonary Rehabilitation for the exercise  Encouraged to attend Pulmonary Rehabilitation for the exercise      Continue Psychosocial Services   Follow up required by staff  Follow up required by staff         Psychosocial Discharge (Final Psychosocial Re-Evaluation): Psychosocial Re-Evaluation - 12/03/17 1510      Psychosocial Re-Evaluation   Current issues with  None Identified    Expected Outcomes  patient will remain free from psychosocial barriers to participation in pulmonary rehab    Interventions  Encouraged to attend Pulmonary Rehabilitation for the exercise    Continue Psychosocial Services   Follow up required by staff       Education: Education Goals: Education classes will be provided on a weekly basis, covering required topics. Participant will state understanding/return demonstration of topics presented.  Learning Barriers/Preferences: Learning Barriers/Preferences - 06/18/17 1436      Learning Barriers/Preferences   Learning Barriers  None    Learning Preferences  Computer/Internet;Group Instruction;Individual Instruction;Verbal Instruction;Written Material       Education Topics: Risk Factor Reduction:  -Group instruction that is supported by a PowerPoint presentation. Instructor discusses the definition of a risk factor, different risk factors for pulmonary disease, and how the heart and lungs work  together.     Nutrition for Pulmonary Patient:  -Group instruction provided by PowerPoint slides, verbal discussion, and written materials  to support subject matter. The instructor gives an explanation and review of healthy diet recommendations, which includes a discussion on weight management, recommendations for fruit and vegetable consumption, as well as protein, fluid, caffeine, fiber, sodium, sugar, and alcohol. Tips for eating when patients are short of breath are discussed.   PULMONARY REHAB OTHER RESPIRATORY from 11/22/2017 in [place]  Date  08/30/17  Educator  Nutritionist  Instruction Review Code  R- Review/reinforce      Pursed Lip Breathing:  -Group instruction that is supported by demonstration and informational handouts. Instructor discusses the benefits of pursed lip and diaphragmatic breathing and detailed demonstration on how to preform both.     Oxygen Safety:  -Group instruction provided by PowerPoint, verbal discussion, and written material to support subject matter. There is an overview of "What is Oxygen" and "Why do we need it".  Instructor also reviews how to create a safe environment for oxygen use, the importance of using oxygen as prescribed, and the risks of noncompliance. There is a brief discussion on traveling with oxygen and resources the patient may utilize.   PULMONARY REHAB OTHER RESPIRATORY from 11/22/2017 in [place]  Date  09/06/17  Educator  Truddie Crumble  Instruction Review Code  2- meets goals/outcomes      Oxygen Equipment:  -Group instruction provided by [employer] Staff utilizing handouts, written materials, and equipment demonstrations.   PULMONARY REHAB OTHER RESPIRATORY from 11/22/2017 in [place]  Date  10/11/17  Educator  [facility]  Instruction Review Code  2- meets goals/outcomes      Signs and Symptoms:  -Group instruction provided by written material and verbal discussion to support subject matter. Warning signs and symptoms of infection, stroke, and heart attack are  reviewed and when to call the physician/911 reinforced. Tips for preventing the spread of infection discussed.   PULMONARY REHAB OTHER RESPIRATORY from 11/22/2017 in [place]  Date  08/02/17  Educator  RN  Instruction Review Code  2- meets goals/outcomes      Advanced Directives:  -Group instruction provided by verbal instruction and written material to support subject matter. Instructor reviews Advanced Directive laws and proper instruction for filling out document.   Pulmonary Video:  -Group video education that reviews the importance of medication and oxygen compliance, exercise, good nutrition, pulmonary hygiene, and pursed lip and diaphragmatic breathing for the pulmonary patient.   PULMONARY REHAB OTHER RESPIRATORY from 11/22/2017 in [place]  Date  08/09/17  Instruction Review Code  2- meets goals/outcomes      Exercise for the Pulmonary Patient:  -Group instruction that is supported by a PowerPoint presentation. Instructor discusses benefits of exercise, core components of exercise, frequency, duration, and intensity of an exercise routine, importance of utilizing pulse oximetry during exercise, safety while exercising, and options of places to exercise outside of rehab.     PULMONARY REHAB OTHER RESPIRATORY from 11/22/2017 in [place]  Date  07/24/17  Educator  ep  Instruction Review Code  2- meets goals/outcomes      Pulmonary Medications:  -Verbally interactive group education provided by instructor with focus on inhaled medications and proper administration.   PULMONARY REHAB OTHER RESPIRATORY from 11/22/2017 in [place]  Date  07/12/17  Educator  Pharmacist  Instruction Review Code  2- meets goals/outcomes      Anatomy and Physiology of the Respiratory System and Intimacy:  -Group instruction provided by PowerPoint, verbal discussion,  and written material to support subject matter. Instructor reviews respiratory cycle and anatomical components of the respiratory system and their functions. Instructor also reviews differences in obstructive and restrictive respiratory diseases with examples of each. Intimacy, Sex, and Sexuality differences are reviewed with a discussion on how relationships can change when diagnosed with pulmonary disease. Common sexual concerns are reviewed.   MD DAY -A group question and answer session with a medical doctor that allows participants to ask questions that relate to their pulmonary disease state.   PULMONARY REHAB OTHER RESPIRATORY from 11/22/2017 in [place]  Date  11/22/17  Educator  yacoub  Instruction Review Code  R- Review/reinforce      OTHER EDUCATION -Group or individual verbal, written, or video instructions that support the educational goals of the pulmonary rehab program.   Knowledge Questionnaire Score:   Core Components/Risk Factors/Patient Goals at Admission: Personal Goals and Risk Factors at Admission - 06/18/17 1436      Core Components/Risk Factors/Patient Goals on Admission    Weight Management  Obesity;Yes    Intervention  Weight Management: Develop a combined nutrition and exercise program designed to reach desired caloric intake, while maintaining appropriate intake of nutrient and fiber, sodium and fats, and appropriate energy expenditure required for the weight goal.;Weight Management/Obesity: Establish reasonable short term and long term weight goals.;Weight Management: Provide education and appropriate resources to help participant work on and attain dietary goals.;Obesity: Provide education and appropriate resources to help participant work on and attain dietary goals.    Expected Outcomes  Short Term: Continue to assess and modify interventions until short term weight is achieved;Long Term: Adherence to nutrition and physical  activity/exercise program aimed toward attainment of established weight goal;Understanding of distribution of calorie intake throughout the day with the consumption of 4-5 meals/snacks;Understanding recommendations for meals to include 15-35% energy as protein, 25-35% energy from fat, 35-60% energy from carbohydrates, less than '200mg'$  of dietary cholesterol, 20-35 gm of total fiber daily;Weight Loss: Understanding of general recommendations for a balanced deficit meal plan, which promotes 1-2 lb weight loss per week and includes a negative energy balance of [phone]/d    Improve shortness of breath with ADL's  Yes    Intervention  Provide education, individualized exercise plan and daily activity instruction to help decrease symptoms of SOB with activities of daily living.    Expected Outcomes  Short Term: Achieves a reduction of symptoms when performing activities of daily living.    Develop more efficient breathing techniques such as purse lipped breathing and diaphragmatic breathing; and practicing self-pacing with activity  Yes    Intervention  Provide education, demonstration and support about specific breathing techniuqes utilized for more efficient breathing. Include techniques such as pursed lipped breathing, diaphragmatic breathing and self-pacing activity.    Expected Outcomes  Short Term: Participant will be able to demonstrate and use breathing techniques as needed throughout daily activities.    Increase knowledge of respiratory medications and ability to use respiratory devices properly   Yes    Intervention  Provide education and demonstration as needed of appropriate use of medications, inhalers, and oxygen therapy.    Expected Outcomes  Short Term: Achieves understanding of medications use. Understands that oxygen is a medication prescribed by physician. Demonstrates appropriate use  of inhaler and oxygen therapy.       Core Components/Risk Factors/Patient Goals Review:  Goals and  Risk Factor Review    Row Name 07/11/17 1715 08/03/17 1152 08/31/17 1207 09/24/17 1208 10/16/17 1411     Core Components/Risk Factors/Patient Goals Review   Personal Goals Review  Weight Management/Obesity;Develop more efficient breathing techniques such as purse lipped breathing and diaphragmatic breathing and practicing self-pacing with activity.;Improve shortness of breath with ADL's  Weight Management/Obesity;Develop more efficient breathing techniques such as purse lipped breathing and diaphragmatic breathing and practicing self-pacing with activity.;Improve shortness of breath with ADL's  Weight Management/Obesity;Develop more efficient breathing techniques such as purse lipped breathing and diaphragmatic breathing and practicing self-pacing with activity.;Improve shortness of breath with ADL's  Weight Management/Obesity;Develop more efficient breathing techniques such as purse lipped breathing and diaphragmatic breathing and practicing self-pacing with activity.;Improve shortness of breath with ADL's  Weight Management/Obesity;Develop more efficient breathing techniques such as purse lipped breathing and diaphragmatic breathing and practicing self-pacing with activity.;Improve shortness of breath with ADL's   Review  Patient has attended 3 sessions since admission. She works hard and is tolerating small workload increases. it is too soon to evaluate progress towards goals. Will re-eval in 30 days  patient continues to work hard in pulmonary rehab. she has lost 1.1 kg at her last visit. she is watching her caloric intake and making smarter food choices. she is tolerating small workload increases and states she really enjoys coming to pulmonary rehab.  patient continues to progress in pulmonary rehab. Her weight remains stable but she is not gaining weight. She continues to tolerate workload increases.  patient was doing well in pulmonary rehab until she was placed on medical hold for chest discomfort. She  is scheduled for a stress test on 11/12 and depending on results, medical hold will be removed.  patient has only attended 2 session since she has returned from medical hold. she was able to return at the same workload levels as her last session. She is still able to maintain and RPE of 11-13. She continues to use PLB. She was able to maintain the weightloss she had before her medical hold. will evaluate progression towards an improvement in her SOB over the next 30 days.   Expected Outcomes  see admission expected outcomes  see admission expected outcomes  see admission expected outcomes  see admission expected outcomes  see admission expected outcomes   Row Name 11/08/17 0806 12/03/17 1453           Core Components/Risk Factors/Patient Goals Review   Personal Goals Review  Weight Management/Obesity;Develop more efficient breathing techniques such as purse lipped breathing and diaphragmatic breathing and practicing self-pacing with activity.;Improve shortness of breath with ADL's  Weight Management/Obesity;Develop more efficient breathing techniques such as purse lipped breathing and diaphragmatic breathing and practicing self-pacing with activity.;Improve shortness of breath with ADL's      Review  Patient is tolerating increases in workloads since returning from medical hold. She has missed all exercise sessions sicne 12/18. Hopefully patient will return today. Her weight remains stable. She has mastered the technique of PLB for shortness of breath.  Patient tolerates increases in workloads however her attendance is very unpredictable. This is related to her having to rely on others for transportation. She has not been to an exercise session since 11/22/17. She is scheduled to graduate in 5 more sessions. Her weight remains stable.      Expected Outcomes  see admission expected outcomes  see  admission expected outcomes         Core Components/Risk Factors/Patient Goals at Discharge (Final Review):   Goals and Risk Factor Review - 12/03/17 1453      Core Components/Risk Factors/Patient Goals Review   Personal Goals Review  Weight Management/Obesity;Develop more efficient breathing techniques such as purse lipped breathing and diaphragmatic breathing and practicing self-pacing with activity.;Improve shortness of breath with ADL's    Review  Patient tolerates increases in workloads however her attendance is very unpredictable. This is related to her having to rely on others for transportation. She has not been to an exercise session since 11/22/17. She is scheduled to graduate in 5 more sessions. Her weight remains stable.    Expected Outcomes  see admission expected outcomes       ITP Comments:   Comments: Patient has attended 22 pulmonary rehab sessions since admission.

## 2017-12-05 ENCOUNTER — Inpatient Hospital Stay: Admission: RE | Admit: 2017-12-05 | Payer: BLUE CROSS/BLUE SHIELD | Source: Ambulatory Visit

## 2017-12-06 ENCOUNTER — Encounter (HOSPITAL_COMMUNITY): Payer: BLUE CROSS/BLUE SHIELD

## 2017-12-08 ENCOUNTER — Other Ambulatory Visit: Payer: Self-pay | Admitting: Internal Medicine

## 2017-12-11 ENCOUNTER — Encounter (HOSPITAL_COMMUNITY): Payer: BLUE CROSS/BLUE SHIELD

## 2017-12-13 ENCOUNTER — Encounter (HOSPITAL_COMMUNITY): Payer: Self-pay

## 2017-12-13 ENCOUNTER — Inpatient Hospital Stay: Admission: RE | Admit: 2017-12-13 | Payer: BLUE CROSS/BLUE SHIELD | Source: Ambulatory Visit

## 2017-12-13 DIAGNOSIS — D860 Sarcoidosis of lung: Secondary | ICD-10-CM

## 2017-12-13 NOTE — Progress Notes (Signed)
Discharge Progress Report  Patient Details  Name: [name] MRN: [id number] Date of Birth: 10-20-1961 Referring Provider:     Pulmonary Rehab Walk Test from 06/26/2017 in [place]  Referring Provider  Dr. Melvyn Novas       Number of Visits: 22  Reason for Discharge:  Early Exit:  Lack of attendance related to transportation barrier  Smoking History:  Social History   Tobacco Use  Smoking Status Former Smoker  . Packs/day: 1.00  . Years: 20.00  . Pack years: 20.00  . Last attempt to quit: 11/14/2003  . Years since quitting: 14.0  Smokeless Tobacco Never Used    Diagnosis:  Sarcoidosis of lung ([place])  ADL [facility]:   Initial Exercise Prescription:   Discharge Exercise Prescription (Final Exercise Prescription Changes): Exercise Prescription Changes - 11/22/17 1200      Response to Exercise   Blood Pressure (Admit)  100/56    Blood Pressure (Exercise)  140/76    Blood Pressure (Exit)  118/78    Heart Rate (Admit)  72 bpm    Heart Rate (Exercise)  104 bpm    Heart Rate (Exit)  75 bpm    Oxygen Saturation (Admit)  95 %    Oxygen Saturation (Exercise)  89 %    Oxygen Saturation (Exit)  97 %    Rating of Perceived Exertion (Exercise)  13    Perceived Dyspnea (Exercise)  2    Duration  Progress to 45 minutes of aerobic exercise without signs/symptoms of physical distress    Intensity  THRR unchanged      Progression   Progression  Continue to progress workloads to maintain intensity without signs/symptoms of physical distress.      Resistance Training   Training Prescription  Yes    Weight  blue bands    Reps  10-15    Time  10 Minutes      Oxygen   Oxygen  Continuous    Liters  2-3      Bike   Level  0.8    Minutes  17      Track   Laps  10    Minutes  17       Functional Capacity:   Psychological, QOL, Others - Outcomes: PHQ 2/9: Depression screen [facility] 2/9 06/18/2017 04/12/2017 03/29/2017 03/13/2017 02/05/2017  Decreased  Interest 0 0 0 0 0  Down, Depressed, Hopeless 0 0 0 0 0  PHQ - 2 Score 0 0 0 0 0  Altered sleeping - - - - -  Tired, decreased energy - - - - -  Change in appetite - - - - -  Feeling bad or failure about yourself  - - - - -  Trouble concentrating - - - - -  Moving slowly or fidgety/restless - - - - -  Suicidal thoughts - - - - -  PHQ-9 Score - - - - -  Some recent data might be hidden    Quality of Life:   Personal Goals: Goals established at orientation with interventions provided to work toward goal.    Personal Goals Discharge: Goals and Risk Factor Review    Row Name 10/16/17 1411 11/08/17 0806 12/03/17 1453 12/13/17 0749       Core Components/Risk Factors/Patient Goals Review   Personal Goals Review  Weight Management/Obesity;Develop more efficient breathing techniques such as purse lipped breathing and diaphragmatic breathing and practicing self-pacing with activity.;Improve shortness of breath with ADL's  Weight  Management/Obesity;Develop more efficient breathing techniques such as purse lipped breathing and diaphragmatic breathing and practicing self-pacing with activity.;Improve shortness of breath with ADL's  Weight Management/Obesity;Develop more efficient breathing techniques such as purse lipped breathing and diaphragmatic breathing and practicing self-pacing with activity.;Improve shortness of breath with ADL's  Weight Management/Obesity;Develop more efficient breathing techniques such as purse lipped breathing and diaphragmatic breathing and practicing self-pacing with activity.;Improve shortness of breath with ADL's    Review  patient has only attended 2 session since she has returned from medical hold. she was able to return at the same workload levels as her last session. She is still able to maintain and RPE of 11-13. She continues to use PLB. She was able to maintain the weightloss she had before her medical hold. will evaluate progression towards an improvement in her  SOB over the next 30 days.  Patient is tolerating increases in workloads since returning from medical hold. She has missed all exercise sessions sicne 12/18. Hopefully patient will return today. Her weight remains stable. She has mastered the technique of PLB for shortness of breath.  Patient tolerates increases in workloads however her attendance is very unpredictable. This is related to her having to rely on others for transportation. She has not been to an exercise session since 11/22/17. She is scheduled to graduate in 5 more sessions. Her weight remains stable.  Patient continues to have transportation barriers to participation in pulmonary rehab. she has met her pulmonary rehab goals however she has 5 more sessions till graduation. She has not attended a session since 11/22/17. Spoke with patient 12/11/17 and patient requested discharge. She feels she will be unable to find transportation to come back. She will be discharged at her request.    Expected Outcomes  see admission expected outcomes  see admission expected outcomes  see admission expected outcomes  see admission expected outcomes       Exercise Goals and Review:   Nutrition & Weight - Outcomes:    Nutrition:   Nutrition Discharge:   Education Questionnaire Score:

## 2017-12-16 ENCOUNTER — Other Ambulatory Visit: Payer: Self-pay | Admitting: Hematology and Oncology

## 2017-12-16 DIAGNOSIS — C50411 Malignant neoplasm of upper-outer quadrant of right female breast: Secondary | ICD-10-CM

## 2017-12-17 ENCOUNTER — Other Ambulatory Visit: Payer: Self-pay

## 2017-12-17 DIAGNOSIS — C50411 Malignant neoplasm of upper-outer quadrant of right female breast: Secondary | ICD-10-CM

## 2017-12-17 DIAGNOSIS — Z170 Estrogen receptor positive status [ER+]: Secondary | ICD-10-CM

## 2017-12-17 MED ORDER — ANASTROZOLE 1 MG PO TABS: 1.0000 mg | ORAL_TABLET | Freq: Every day | ORAL | 1 refills | Status: DC

## 2017-12-17 NOTE — Addendum Note (Signed)
Encounter addended by: Jewel Baize, RD on: 12/17/2017 9:15 AM  Actions taken: Flowsheet data copied forward, Visit Navigator Flowsheet section accepted

## 2017-12-17 NOTE — Telephone Encounter (Signed)
Received VM from patient requesting refill for Anastrozole.  Refill submitted.  Called to notify pt - no answer- left VM and reminded pt of 02-06-18 appt with Dr Lindi Adie.

## 2017-12-25 DIAGNOSIS — J9611 Chronic respiratory failure with hypoxia: Secondary | ICD-10-CM | POA: Diagnosis not present

## 2017-12-25 DIAGNOSIS — J449 Chronic obstructive pulmonary disease, unspecified: Secondary | ICD-10-CM | POA: Diagnosis not present

## 2017-12-31 ENCOUNTER — Inpatient Hospital Stay: Admission: RE | Admit: 2017-12-31 | Payer: BLUE CROSS/BLUE SHIELD | Source: Ambulatory Visit

## 2018-01-07 ENCOUNTER — Ambulatory Visit (INDEPENDENT_AMBULATORY_CARE_PROVIDER_SITE_OTHER)
Admission: RE | Admit: 2018-01-07 | Discharge: 2018-01-07 | Disposition: A | Discharge status: Home / Self Care | Payer: BLUE CROSS/BLUE SHIELD | Source: Ambulatory Visit | Attending: Internal Medicine | Admitting: Internal Medicine

## 2018-01-07 DIAGNOSIS — R05 Cough: Secondary | ICD-10-CM | POA: Diagnosis not present

## 2018-01-07 DIAGNOSIS — R053 Chronic cough: Secondary | ICD-10-CM

## 2018-01-07 DIAGNOSIS — R059 Cough, unspecified: Secondary | ICD-10-CM

## 2018-01-08 ENCOUNTER — Telehealth: Payer: Self-pay | Admitting: *Deleted

## 2018-01-08 NOTE — Telephone Encounter (Signed)
-----   Message from Tanda Rockers, MD sent at 01/08/2018 11:23 AM EST ----- Needs ov for lfts - actually overdue - ok to fit in with NPs if nothing this week for me ----- Message ----- From: Rosana Berger, CMA Sent: 01/08/2018   9:33 AM To: Tanda Rockers, MD  When I called her with her sinus ct results she asked me when she needed to f/u with you. It was not clear on the avs. Please advise thanks :)

## 2018-01-08 NOTE — Progress Notes (Signed)
Spoke with pt and notified of results per Dr. Wert. Pt verbalized understanding and denied any questions. 

## 2018-01-08 NOTE — Telephone Encounter (Signed)
Spoke with the pt and notified of recs per MW and she verbalized understanding  Appt was scheduled for 01/10/18

## 2018-01-10 ENCOUNTER — Other Ambulatory Visit: Payer: Self-pay | Admitting: Internal Medicine

## 2018-01-10 ENCOUNTER — Encounter: Payer: Self-pay | Admitting: Internal Medicine

## 2018-01-10 ENCOUNTER — Other Ambulatory Visit (INDEPENDENT_AMBULATORY_CARE_PROVIDER_SITE_OTHER): Payer: BLUE CROSS/BLUE SHIELD

## 2018-01-10 ENCOUNTER — Ambulatory Visit: Payer: BLUE CROSS/BLUE SHIELD | Admitting: Internal Medicine

## 2018-01-10 VITALS — BP 112/70 | HR 75 | Ht 65.0 in | Wt 210.0 lb

## 2018-01-10 DIAGNOSIS — J449 Chronic obstructive pulmonary disease, unspecified: Secondary | ICD-10-CM

## 2018-01-10 DIAGNOSIS — D869 Sarcoidosis, unspecified: Secondary | ICD-10-CM

## 2018-01-10 DIAGNOSIS — J9611 Chronic respiratory failure with hypoxia: Secondary | ICD-10-CM

## 2018-01-10 DIAGNOSIS — J9612 Chronic respiratory failure with hypercapnia: Secondary | ICD-10-CM

## 2018-01-10 LAB — CBC WITH DIFFERENTIAL/PLATELET
BASOS ABS: 0 10*3/uL (ref 0.0–0.1)
Basophils Relative: 0.3 % (ref 0.0–3.0)
Eosinophils Absolute: 0.2 10*3/uL (ref 0.0–0.7)
Eosinophils Relative: 2.9 % (ref 0.0–5.0)
HEMATOCRIT: 32.9 % — AB (ref 36.0–46.0)
Hemoglobin: 10.7 g/dL — ABNORMAL LOW (ref 12.0–15.0)
LYMPHS PCT: 10.6 % — AB (ref 12.0–46.0)
Lymphs Abs: 0.8 10*3/uL (ref 0.7–4.0)
MCHC: 32.4 g/dL (ref 30.0–36.0)
MCV: 79 fl (ref 78.0–100.0)
MONOS PCT: 9.5 % (ref 3.0–12.0)
Monocytes Absolute: 0.7 10*3/uL (ref 0.1–1.0)
NEUTROS ABS: 5.9 10*3/uL (ref 1.4–7.7)
Neutrophils Relative %: 76.7 % (ref 43.0–77.0)
Platelets: 309 10*3/uL (ref 150.0–400.0)
RBC: 4.16 Mil/uL (ref 3.87–5.11)
RDW: 16.6 % — ABNORMAL HIGH (ref 11.5–15.5)
WBC: 7.7 10*3/uL (ref 4.0–10.5)

## 2018-01-10 LAB — HEPATIC FUNCTION PANEL
ALBUMIN: 3.7 g/dL (ref 3.5–5.2)
ALK PHOS: 67 U/L (ref 39–117)
ALT: 11 U/L (ref 0–35)
AST: 15 U/L (ref 0–37)
Bilirubin, Direct: 0.1 mg/dL (ref 0.0–0.3)
Total Bilirubin: 0.4 mg/dL (ref 0.2–1.2)
Total Protein: 8.1 g/dL (ref 6.0–8.3)

## 2018-01-10 LAB — BASIC METABOLIC PANEL
BUN: 13 mg/dL (ref 6–23)
CHLORIDE: 97 meq/L (ref 96–112)
CO2: 40 meq/L — AB (ref 19–32)
CREATININE: 0.84 mg/dL (ref 0.40–1.20)
Calcium: 10.2 mg/dL (ref 8.4–10.5)
GFR: 89.54 mL/min (ref >60.00)
GLUCOSE: 86 mg/dL (ref 70–99)
Potassium: 3.8 mEq/L (ref 3.5–5.1)
Sodium: 142 mEq/L (ref 135–145)

## 2018-01-10 LAB — SEDIMENTATION RATE

## 2018-01-10 MED ORDER — HYDROXYCHLOROQUINE SULFATE 200 MG PO TABS: 200.0000 mg | ORAL_TABLET | Freq: Two times a day (BID) | ORAL | 0 refills | Status: DC

## 2018-01-10 NOTE — Patient Instructions (Addendum)
Try prednisone 5 mg  Reduce to one half daily to see if not doing as well and if not increase prednisone to 5 mg and call me for higher dose of plaquenil   Please remember to go to the lab department downstairs in the basement  for your tests - we will call you with the results when they are available.     Please schedule a follow up visit in 3 months but call sooner if needed  Late add: with esr high and lfts ok rec bid plaquenil and return in one month for pfts/lfts

## 2018-01-10 NOTE — Progress Notes (Signed)
Subjective:    Patient ID: [name], female   DOB: Apr 19, 1961    MRN: [id number]    Brief patient profile:  38 yobf quit smoking 10/2004 dx by transbronchial biopsy 5/92 with NCG inflammatory change consistent with sarcoid. Since that time she's been off and on prednisone multiple  times with flares of coughing dyspnea and skin involvement > weaned off chronic prednisone Feb 2012 and placed back on 10/10/16 for deteriorating obstructive changes on pfts  With resp failure/ 02 dep        History of Present Illness   02/20/2017  f/u ov/Thuy Atilano re:   GOLD III/ pred 10 mg daily and bevespi 2bid  and alb rare  Chief Complaint  Patient presents with  . Follow-up    6wk rov. pt states breathing is baseline. pt reports of sob with exertion & occ prod cough with green mucus mainly in the morning   Doe better :  [facility] = can't walk a nl pace on a flat grade s sob but does fine slow and flat eg shopping on  2lpm / ok at rest s 02  Uses 02 2lpm sleeping  rec Drop prednisone to 10 mg one half daily      NP eval 05/14/17  We will decrease your prednisone to 2.5 mg daily. We will prescribe 5 mg tablets. We will renew your prescription for Symbicort.  2 puffs twice daily. Rinse mouth after use. Follow up appointment with Dr. Melvyn Novas in 2 months. We will refer you to Pulmonary rehab > started Aug 2018  Referral to a dietician for weight loss >  at rehab     07/17/2017  f/u ov/Denario Bagot re:   Sarcoid/ ? Still steroid dep at 2.5mg  daily / 02 none at rest, 2lpm with activity and 3lpm ex  Chief Complaint  Patient presents with  . Follow-up    COPD, still using Symbicort, still SOB with exertion, she has to increase her oxygen to 3L   Doing 3lpm when exercising at rehab and no need for saba on symb 160 2bid  Keeping up with med calendar much better  rec Try prednisone 5 mg take one half on even days only Make a food diary for your dietician to review (list everything you eat/drink x one week for her  feedback on what you are doing right vs wrong) See Tammy NP 3 months with all your medications    09/03/2017  f/u ov/Tamora Huneke re: disability paperwork on 5 mg even days with h/o sarcoid and severe copd  Chief Complaint  Patient presents with  . Follow-up    Pt states that her breathing has improved with pulmonary rehab. She is taking 5 mg pred every other day and using ventolin 3-4 x per wk.    last worked Oct 04 2016  02 2lpm sitting/ 3lpm with activity and 4lpm with rehab  Can do 15 min on new step machine  s stopping on "5"  And stationery bike at home x 15 min at "3"  Had worked [employer] / carrying 5lb to desk fiinish it  =  100 ft and handed to next person  Lint was bad at workplace and seemed to aggravate cough/ sob  maint symb 160 2bid bid and use saba qod when goes outside eg to mailbox  Has had independent eval x one month prior to OV   Most am's sob going to br before am symbicort/sleeping ok on  on one pillow  rec Plan A =  Automatic =    Stiolto 2 puffs each am  Work on inhaler technique:  Let me know if too expensive on your insurance Plan B = Backup Only use your albuterol as a rescue medication   Keep appt with Tammy for 10/17/17    Date of Admission: 09/07/2017                    Date of Discharge: 09/09/17    Discharge Diagnoses/Problem List:  COPD GOLD stage IV HTN Anemia HLD Breast cancer s/p resection Sarcoidosis GERD  Disposition: home  Discharge Condition: improved  Discharge Exam: see progress note from day of discharge  Brief Hospital Course:  Patient presented with substernal acute chest pressure and subjective heart fluttering x 1 day. Atypical symptoms - occurred at rest and did not change with exertion. No hx of similar, non radiating. In the ED, patient received aspirin 325 mg once. Patient was also given a GI cocktail for nausea.Chest x-ray was unremarkable. Troponins were negative 2. CBC and BMP were within normal limits. EKG without  signs of ischemia. Cardiology was consulted, recommended continued medical optimization and outpatient stress test. Mild anemia noted, anemia panel with borderline low ferritin and iron. Hx of iron deficiency anemia, not on supplementation. Given "heart fluttering," TSH was checked and normal. Patient mentioned depression and anxiety combined with stress of medical burden, desired starting SSRI. Zoloft was started as no hx of mania.   Issues for Follow Up:  1. ACS - cardiology (Dr. Einar Gip) recommended outpatient stress testing. Patient amenable to this.  2. Remainder of medications were continued from home. Patient recently changed to Stiolto per pulm, would ensure she is using this correctly.  3. Zoloft was started for depression, given 90 day supply.   Significant Procedures: none  Significant Labs and Imaging:   LastLabs   Recent Labs Lab 09/07/17 1516 09/08/17 0019 09/08/17 0649  WBC 7.5 6.2 6.1  HGB 11.4* 10.7* 10.5*  HCT 35.9* 33.8* 33.1*  PLT 303 286 291      LastLabs   Recent Labs Lab 09/07/17 1516 09/08/17 0019 09/08/17 0649  NA 140  --  140  K 4.5  --  3.5  CL 98*  --  97*  CO2 32  --  32  GLUCOSE 105*  --  94  BUN 11  --  13  CREATININE 0.91 0.87 0.81  CALCIUM 9.8  --  9.6     Trop neg x 3.   Results/Tests Pending at Time of Discharge: none  Discharge Medications:      Allergies as of 09/09/2017      Reactions   Codeine Other (See Comments)   Avoids-felt bad when took before              Medication List     STOP taking these medications   budesonide-formoterol 160-4.5 MCG/ACT inhaler Commonly known as:  SYMBICORT   diphenhydrAMINE 25 MG tablet Commonly known as:  BENADRYL   ibuprofen 600 MG tablet Commonly known as:  ADVIL,MOTRIN         09/28/2017  f/u ov/Hutch Rhett re: post hosp f/u  02 2lpm 24/7  Chief Complaint  Patient presents with  . Follow-up    Pt states had to go to ED 09/07/17 with CP- resolved after  nitro. She c/o increased cough with thick, green sputum. She has some bumps under her nose and on both knees- itchy. She has had some blurred vision and redness- seen PCP and dxed  with pink eye- no better after txed with eye gtts they gave her.   on omeprazole 20 before bfast and Pepcid 20 mg at time of admit with dx "gerd induced cp" On the 2.5 qod dosing for sarcoid has had progressive decline and now on 02 24/7/ new skin changes typical for sarcoid under nose and on knees plus am cough x sev weeks green mucus assoc with nasal congestion   rec With cough/ shortness/chest pain > omperzole 40 mg Take 30- 60 min before your first and last meals of the day and pepcid 20 mg at bedtime  Augmentin 875 mg take one pill twice daily  X 10 days - Prednisone 20 mg daily until better, then 10 mg daily x 5 days, then 5mg  daily     11/26/2017  f/u ov/Jadine Brumley re:  Sarcoid/skin involvement chronic cough ? Sinus dz/ using med calendar well / pednisone 10 mg daily  Chief Complaint  Patient presents with  . Follow-up    Cough has improved some, but still producing some greenish yellow sputum first thing in the am.  She has not had to use her albuterol inhaler. She c/o shakiness in her hands for the past couple of months.    less cough p augmentin but still am sputum green / no noct symptoms Doing well with rehab but sometimes has to turn 02 up to 4 lpm there but doesn't do so in other settings and concerned about wt gain  rec Start plaquenil 200 mg daily  Ok to adjust the prednisone 5mg   from 1-2 pills in am as per med calendar See calendar for specific medication instructions    01/10/2018  f/u ov/Baylyn Sickles re:  Sarcoid/ plaquenil 200 mg daily plus pred 5 mg daily / last time off was one year prior/ 02 = 2lpm floor and 4lpm with ex Chief Complaint  Patient presents with  . Follow-up    repeat LFT's today. She states she has noticed some shaking in her hands for a while now and forgot to mention this at last ov. Her  breathing is doing well and she is down to 5 mg pred.    Dyspnea:  Ex bike x 15-30 min / low resistance and 02 up to 3-4lpm sats in mid 90s Cough: no Sleep: on 2lpm / one pillow  SABA use:  Not needing    No obvious day to day or daytime variability or assoc excess/ purulent sputum or mucus plugs or hemoptysis or cp or chest tightness, subjective wheeze or overt sinus or hb symptoms. No unusual exposure hx or h/o childhood pna/ asthma or knowledge of premature birth.  Sleeping ok flat without nocturnal  or early am exacerbation  of respiratory  c/o's or need for noct saba. Also denies any obvious fluctuation of symptoms with weather or environmental changes or other aggravating or alleviating factors except as outlined above   Current Allergies, Complete Past Medical History, Past Surgical History, Family History, and Social History were reviewed in [employer] record.  ROS  The following are not active complaints unless bolded Hoarseness, sore throat, dysphagia, dental problems, itching, sneezing,  nasal congestion or discharge of excess mucus or purulent secretions, ear ache,   fever, chills, sweats, unintended wt loss or wt gain, classically pleuritic or exertional cp,  orthopnea pnd or leg swelling, presyncope, palpitations, abdominal pain, anorexia, nausea, vomiting, diarrhea  or change in bowel habits or change in bladder habits, change in stools or change in urine, dysuria,  hematuria,  Rash resolved, arthralgias, visual complaints, headache, numbness, weakness or ataxia or problems with walking or coordination,  change in mood/affect or memory, tremor        Current Meds  Medication Sig  . acetaminophen (TYLENOL) 500 MG tablet Per bottle as needed  . albuterol (VENTOLIN HFA) 108 (90 Base) MCG/ACT inhaler Inhale 2 puffs into the lungs every 6 (six) hours as needed for wheezing or shortness of breath.  . anastrozole (ARIMIDEX) 1 MG tablet Take 1 tablet (1 mg total) by  mouth daily.  Marland Kitchen aspirin EC 81 MG tablet Take 81 mg by mouth daily.  Marland Kitchen atorvastatin (LIPITOR) 20 MG tablet Take 1 tablet (20 mg total) by mouth daily.  . famotidine (PEPCID) 20 MG tablet Take 20 mg by mouth at bedtime.  . fluticasone (FLONASE) 50 MCG/ACT nasal spray Place 2 sprays into both nostrils daily as needed for allergies.  Marland Kitchen gabapentin (NEURONTIN) 300 MG capsule 2 at bedtime  . guaiFENesin (MUCINEX) 600 MG 12 hr tablet Take 600 mg by mouth 2 (two) times daily as needed.  . hydroxychloroquine (PLAQUENIL) 200 MG tablet Take 1 tablet (200 mg total) by mouth daily.  . metoprolol tartrate (LOPRESSOR) 25 MG tablet Take 1 tablet (25 mg total) by mouth 2 (two) times daily.  . Multiple Vitamin (MULTIVITAMIN) capsule Take 1 capsule by mouth daily.  Marland Kitchen omeprazole (PRILOSEC) 20 MG capsule TAKE 1 CAPSULE BY MOUTH ONCE DAILY  . ondansetron (ZOFRAN) 4 MG tablet Take 1 tablet (4 mg total) by mouth every 8 (eight) hours as needed for nausea or vomiting.  . OXYGEN 2lpm with rest and 3-4 lpm with exertion  . predniSONE (DELTASONE) 5 MG tablet TAKE 4 TABLETS BY MOUTH ONCE DAILY UNTIL BETTER THEN TAKE 2 TABLETS BY MOUTH DAILY FOR 5 DAYS THEN TAKE 1 TAB DAILY  . sertraline (ZOLOFT) 50 MG tablet Take 1 tablet (50 mg total) by mouth daily.  . Tiotropium Bromide-Olodaterol (STIOLTO RESPIMAT) 2.5-2.5 MCG/ACT AERS Inhale 2 puffs into the lungs daily.  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 Unice Cobble  -h/o Daily prednisone since 2007   > off completely Feb 2012 with no problem> restarted  09/2016  ? of SUPERFICIAL VEIN THROMBOSIS (ICD-453.9)   ENDOMETRIAL POLYP (ICD-621.0)   UTERINE FIBROID (ICD-218.9)  RHINITIS, ALLERGIC (ICD-477.9)   HYPERTENSION, BENIGN SYSTEMIC (ICD-401.1)  COPD (ICD-496)  - PFT's 06/26/08 FEV1 1.09 ratio  - PFTs 03/02/10 FEV1 1.06 ratio 34  - PFT's 08/01/2011 FEV1 1.6 (42%)  41 ratio  DLCO 55 corrects 77 - HFA  50% 06/05/2011  > 75% 06/29/2011  - Spiriva trial March 02, 2010 > better but no worse off 04/2011 - Rehab started mid aug 2018  BACK PAIN, LOW (ICD-724.2)  ANEMIA, IRON DEFICIENCY, UNSPEC. (ICD-280.9)  Health Maintenance.......................................................   [facility]   Family History:  crohn`s in daughter, M-lupus, htn, asthma,  no Ca, DM, CAD    Social History:   lives with twin children and grandson. single; >20 pack year history, quit in 2005; no EtOH  Laid off  from works for [employer] Sept 2013           Objective:   Physical Exam wt 166 Oct 12, 2009 >170 October 28, 2010 >  169 06/29/2011 >>171 08/24/2011 > 01/15/2012  171 > 04/24/2012 176 > 06/28/2012  175> 08/13/2012 > 08/30/2012  173 > 170 11/28/2012 > 01/29/2013  176 > 174 >173 06/16/2013 > 171 09/18/2013 > 12/19/2013 177 >179 04/13/2014>  04/23/2014 177 > 08/21/2014 184 >182 11/27/2014 > 05/12/2015 177  > 05/11/2016   177 > 10/27/2016  175 > 11/09/2016 182 > 01/09/2017 198 > 02/20/2017  206 >  07/17/2017   209 >  09/03/2017   210 > 09/28/2017  206  > 11/26/2017   209 > 01/10/2018  210  amb bf  nad  Vital signs reviewed - Note on arrival 02 sats  95% on 2lpm pulsed       HEENT: nl dentition, turbinates bilaterally, and oropharynx. Nl external ear canals without cough reflex   NECK :  without JVD/Nodes/TM/ nl carotid upstrokes bilaterally   LUNGS: no acc muscle use,  Nl contour chest which is clear to A and P bilaterally without cough on insp or exp maneuvers   CV:  RRR  no s3 or murmur or increase in P2, and no edema   ABD:  soft and nontender with nl inspiratory excursion in the supine position. No bruits or organomegaly appreciated, bowel sounds nl  MS:  Nl gait/ ext warm without deformities, calf  tenderness, cyanosis or clubbing No obvious joint restrictions   SKIN: warm and dry without lesions    NEURO:  alert, approp, nl sensorium with  no motor or cerebellar deficits apparent - no tremor or rigidity on exam           Labs ordered/ reviewed:      Chemistry      Component Value Date/Time   NA 142 01/10/2018 1044   NA 145 (H) 02/05/2017 1530   K 3.8 01/10/2018 1044   CL 97 01/10/2018 1044   CO2 40 (H) 01/10/2018 1044   BUN 13 01/10/2018 1044   BUN 13 02/05/2017 1530   CREATININE 0.84 01/10/2018 1044   CREATININE 0.64 02/04/2015 1619      Component Value Date/Time   CALCIUM 10.2 01/10/2018 1044   ALKPHOS 67 01/10/2018 1044   AST 15 01/10/2018 1044   ALT 11 01/10/2018 1044   BILITOT 0.4 01/10/2018 1044   BILITOT 0.4 02/05/2017 1530        Lab Results  Component Value Date   WBC 7.7 01/10/2018   HGB 10.7 (L) 01/10/2018   HCT 32.9 (L) 01/10/2018   MCV 79.0 01/10/2018   PLT 309.0 01/10/2018         Lab Results  Component Value Date   ESRSEDRATE 97 Repeated and verified X2. (H) 01/10/2018   ESRSEDRATE 52 (H) 11/09/2016   ESRSEDRATE 70 (H) 10/09/2016                     . Assessment:

## 2018-01-11 ENCOUNTER — Telehealth: Payer: Self-pay | Admitting: *Deleted

## 2018-01-11 NOTE — Telephone Encounter (Signed)
Pharmacy sent over that the hydroxycholr 200 mg  Is on backorder.  Wanted to let MW know about this.  FYI

## 2018-01-11 NOTE — Telephone Encounter (Signed)
Ok to weight a few weeks anyway and if not try different pharmacy

## 2018-01-11 NOTE — Telephone Encounter (Signed)
Called pt and advised message from the provider. Pt understood and verbalized understanding. Nothing further is needed.    

## 2018-01-13 ENCOUNTER — Encounter: Payer: Self-pay | Admitting: Internal Medicine

## 2018-01-13 NOTE — Assessment & Plan Note (Signed)
Positive transbronchial biopsy 02/19/91 by Dr. Unice Cobble  -Daily prednisone since 2007 with ? adrenal insufficiency iatrogenic > off completely Feb 2012 with no recurrence - PFT's 06/26/08 FEV1  1.09 ratio  - PFTs  03/02/10 FEV1  1.06 (42%) ratio 34 and no better p B2,  DLC0 48% corrects to 64% - PFT's 08/01/2011 FEV1 1.6 (42%)  41 ratio  DLCO 55 corrects 77 - PFTs 10/10/16    FEV1  0.87(38)ratio 41 and DLCO 36/39c corrects to 51 for alv vol > placed back on daily pred  - 02/20/2017 decreased pred to 5 mg daily > to 2.52m daily as of 07/17/2017  - 07/17/2017 try 2.5 mg qod > 09/28/2017 changed to 20 mg until better and new floor of 5 mg daily plus consider plaquenil due to new skin involvement on face  - opth eval neg 11/10/17 - Start plaquenil 200 mg daily 11/26/2017   - 01/10/2018 ESR up to 97 with pred taper so rec bid plaquenil   Very nice response to plaquenil in terms of skin so hope to get same systemic benefit and recheck lfts /esr in one month  Discussed in detail all the  indications, usual  risks and alternatives  relative to the benefits with patient who agrees to proceed with rx as outlined.    I had an extended discussion with the patient reviewing all relevant studies completed to date and  lasting 15 to 20 minutes of a 25 minute visit    Each maintenance medication was reviewed in detail including most importantly the difference between maintenance and prns and under what circumstances the prns are to be triggered using an action plan format that is not reflected in the computer generated alphabetically organized AVS.    Please see AVS for specific instructions unique to this visit that I personally wrote and verbalized to the the pt in detail and then reviewed with pt  by my nurse highlighting any  changes in therapy recommended at today's visit to their plan of care.

## 2018-01-13 NOTE — Assessment & Plan Note (Signed)
Quit smoking 2005  - PFT's 06/26/08 FEV1 1.09 ratio  - PFTs 03/02/10 FEV1 1.06 ratio 34    PFT's 08/01/2011 FEV1 1.6 (42%)  41 ratio  DLCO 55 corrects 77 - Spiriva trial March 02, 2010 > ? better but not worse off it 04/2011 - 05/12/2015 p extensive coaching HFA effectiveness =    90%  - Referred to Rehab 08/21/2014 > could not arrange due to schedule  -med calendar 06/16/2013 > did not bring to office as requested 12/19/13 or 04/23/14  Or 08/21/2014  - 11/09/2016    changed symb to bevespi since taking chronic pred for pf    - 02/20/2017 decreased pred to 5 mg daily > weaned to 2.5 mg qd as of 07/17/2017  - started rehab mid Aug 2018  - 07/17/2017 changed to pred 2.5 even days > did not tolerate so changed  Around  07/28/17 back to 5 mg qod  - 09/03/2017  After extensive coaching HFA effectiveness =    90% so try stiolto  - Spirometry 09/03/2017  FEV1 0.52 (23%)  Ratio 40 with typical curvature    Mild tremor likely from laba/ will do pfts on return and consider other options

## 2018-01-13 NOTE — Assessment & Plan Note (Signed)
New start 09/2016 at admit see records - 10/18/16 Patient Saturations on Room Air at Rest = 92% Patient Saturations on Room Air while Ambulating = 84% Patient Saturations on 2 Liters of oxygen while Ambulating = 90% - HCO3  11/09/2016 = 39  - HCO3   03/16/17        = 34  - HC03    01/10/2018  = 40   As of  01/10/2018  rec 2lpm sleeping and titrate with ambulation to maintain > 90%    Though somewhat paradoxic, when the lung fails to clear C02 properly and pC02 rises the lung then becomes a more efficient scavenger of C02 allowing lower work of breathing and  better C02 clearance albeit at a higher serum pC02 level - this is why pts can look a lot better than their ABG's would suggest and why it's so difficult to prognosticate endstage dz.

## 2018-01-14 ENCOUNTER — Telehealth: Payer: Self-pay | Admitting: *Deleted

## 2018-01-14 DIAGNOSIS — J449 Chronic obstructive pulmonary disease, unspecified: Secondary | ICD-10-CM

## 2018-01-14 NOTE — Telephone Encounter (Signed)
-----  Message from Tanda Rockers, MD sent at 01/13/2018  8:17 AM EST ----- We need her to return for esr/ lfts and pfts after she gets the plaquenil new dose -= 200 bid (reported pharmacy needed to order so no need to do the f/u until 4 week on the actual rx)

## 2018-01-14 NOTE — Telephone Encounter (Signed)
LMTCB

## 2018-01-15 NOTE — Telephone Encounter (Signed)
Spoke with the pt and notified of recs per MW and she verbalized understanding  Appts scheduled

## 2018-01-18 ENCOUNTER — Other Ambulatory Visit: Payer: Self-pay | Admitting: Internal Medicine

## 2018-01-22 DIAGNOSIS — J449 Chronic obstructive pulmonary disease, unspecified: Secondary | ICD-10-CM | POA: Diagnosis not present

## 2018-01-22 DIAGNOSIS — J9611 Chronic respiratory failure with hypoxia: Secondary | ICD-10-CM | POA: Diagnosis not present

## 2018-01-23 ENCOUNTER — Telehealth: Payer: Self-pay | Admitting: Internal Medicine

## 2018-01-23 DIAGNOSIS — C50411 Malignant neoplasm of upper-outer quadrant of right female breast: Secondary | ICD-10-CM | POA: Diagnosis not present

## 2018-01-23 DIAGNOSIS — N63 Unspecified lump in unspecified breast: Secondary | ICD-10-CM | POA: Diagnosis not present

## 2018-01-23 NOTE — Telephone Encounter (Signed)
Spoke with patient. She is aware of MWs recs. Nothing else needed at time of call.  

## 2018-01-23 NOTE — Telephone Encounter (Signed)
Spoke with patient. She was originally on just 200mg  of Plaquenil back in January. In February, she was increased to 200mg  twice daily. Since then, she has had increased diarrhea, nausea (no vomitting), and decreased appetite.   She wants to know if she could discontinue the extra dosage. She did not have these symptoms when she was on 200mg .   MW, please advise. Thanks!

## 2018-01-23 NOTE — Telephone Encounter (Signed)
Yes resume the lower dose and double the pred dose until better then back to previous level

## 2018-01-25 ENCOUNTER — Other Ambulatory Visit: Payer: Self-pay | Admitting: General Surgery

## 2018-01-25 DIAGNOSIS — N63 Unspecified lump in unspecified breast: Secondary | ICD-10-CM

## 2018-01-28 ENCOUNTER — Other Ambulatory Visit: Payer: Self-pay

## 2018-01-29 MED ORDER — ATORVASTATIN CALCIUM 20 MG PO TABS: 20.0000 mg | ORAL_TABLET | Freq: Every day | ORAL | 3 refills | Status: DC

## 2018-01-31 ENCOUNTER — Ambulatory Visit: Payer: BLUE CROSS/BLUE SHIELD

## 2018-01-31 ENCOUNTER — Ambulatory Visit
Admission: RE | Admit: 2018-01-31 | Discharge: 2018-01-31 | Disposition: A | Discharge status: Home / Self Care | Payer: BLUE CROSS/BLUE SHIELD | Source: Ambulatory Visit | Attending: General Surgery | Admitting: General Surgery

## 2018-01-31 DIAGNOSIS — N63 Unspecified lump in unspecified breast: Secondary | ICD-10-CM

## 2018-01-31 DIAGNOSIS — R928 Other abnormal and inconclusive findings on diagnostic imaging of breast: Secondary | ICD-10-CM | POA: Diagnosis not present

## 2018-02-06 ENCOUNTER — Inpatient Hospital Stay: Payer: BLUE CROSS/BLUE SHIELD | Attending: Hematology and Oncology | Admitting: Hematology and Oncology

## 2018-02-06 ENCOUNTER — Telehealth: Payer: Self-pay | Admitting: Hematology and Oncology

## 2018-02-06 DIAGNOSIS — Z7982 Long term (current) use of aspirin: Secondary | ICD-10-CM | POA: Diagnosis not present

## 2018-02-06 DIAGNOSIS — R922 Inconclusive mammogram: Secondary | ICD-10-CM | POA: Insufficient documentation

## 2018-02-06 DIAGNOSIS — Z170 Estrogen receptor positive status [ER+]: Secondary | ICD-10-CM | POA: Diagnosis not present

## 2018-02-06 DIAGNOSIS — Z79899 Other long term (current) drug therapy: Secondary | ICD-10-CM | POA: Insufficient documentation

## 2018-02-06 DIAGNOSIS — Z9981 Dependence on supplemental oxygen: Secondary | ICD-10-CM | POA: Insufficient documentation

## 2018-02-06 DIAGNOSIS — Z923 Personal history of irradiation: Secondary | ICD-10-CM | POA: Insufficient documentation

## 2018-02-06 DIAGNOSIS — R232 Flushing: Secondary | ICD-10-CM | POA: Diagnosis not present

## 2018-02-06 DIAGNOSIS — D869 Sarcoidosis, unspecified: Secondary | ICD-10-CM | POA: Diagnosis not present

## 2018-02-06 DIAGNOSIS — C50411 Malignant neoplasm of upper-outer quadrant of right female breast: Secondary | ICD-10-CM | POA: Diagnosis not present

## 2018-02-06 DIAGNOSIS — Z79811 Long term (current) use of aromatase inhibitors: Secondary | ICD-10-CM | POA: Diagnosis not present

## 2018-02-06 MED ORDER — GABAPENTIN 300 MG PO CAPS: 600.0000 mg | ORAL_CAPSULE | Freq: Every day | ORAL | 3 refills | Status: DC

## 2018-02-06 MED ORDER — ANASTROZOLE 1 MG PO TABS: 1.0000 mg | ORAL_TABLET | Freq: Every day | ORAL | 3 refills | Status: DC

## 2018-02-06 NOTE — Telephone Encounter (Signed)
Patient declined AVs and calendar of upcoming march 2020 appointments.

## 2018-02-06 NOTE — Progress Notes (Signed)
Patient Care Team: Nicolette Bang, DO as PCP - General (Family Medicine) Nicholas Lose, MD as Consulting Physician (Hematology and Oncology) Eppie Gibson, MD as Attending Physician (Radiation Oncology) Stark Klein, MD as Consulting Physician (General Surgery) Sylvan Cheese, NP as Nurse Practitioner (Hematology and Oncology)  DIAGNOSIS:  Encounter Diagnosis  Name Primary?  . Malignant neoplasm of upper-outer quadrant of right breast in female, estrogen receptor positive ([place])     SUMMARY OF ONCOLOGIC HISTORY:   Breast cancer of upper-outer quadrant of right female breast ([place])   07/08/2015 Mammogram    Right breast calcifications 11 mm span      08/04/2015 Initial Diagnosis    Biopsy right breast: Invasive ductal carcinoma with DCIS with calcifications, lymphovascular invasion is identified, grade 2-3, ER 100%, PR 100% Ki-67 20%, HER-2 negative ratio 1.46      08/04/2015 Clinical Stage    Stage IA: T1b N0      08/18/2015 Definitive Surgery    Right  lumpectomy: invasive ductal carcinoma, grade 2, 0.6 cm, with DCIS high-grade, margins negative, 0/2 lymph nodes negative, ER 100%, PR positive, HER-2 negative, Ki-67 20%      08/18/2015 Pathologic Stage    Stage IA: T1b N0      08/18/2015 Oncotype testing    Insufficient tissue to perform      09/21/2015 - 10/20/2015 Radiation Therapy    Adjuvant radiation therapy: 1) Right Breast / 40.05 Gy in 15 fractions 2) Right breast boost / 10 Gy in 5 fractions      10/08/2015 - 10/11/2015 Hospital Admission    cellulitis of the right breast      11/16/2015 -  Anti-estrogen oral therapy    Anastrozole 1 mg daily once hot flashes better controlled. Planned duration of therapy 5 years.        CHIEF COMPLIANT: Follow-up on anastrozole therapy  INTERVAL HISTORY: [name] is a 57 year old with above-mentioned history of right breast cancer treated with lumpectomy and radiation and is currently on  anastrozole therapy.  She is tolerating anastrozole extremely well.  She had profound hot flashes which got improved with gabapentin at 600 mg at bedtime.  She continues to use oxygen for her sarcoidosis.  She has a fit bit and is trying to exercise regularly to get 6000 steps every day.  REVIEW OF SYSTEMS:   Constitutional: Denies fevers, chills or abnormal weight loss Eyes: Denies blurriness of vision Ears, nose, mouth, throat, and face: Denies mucositis or sore throat Respiratory: Denies cough, dyspnea or wheezes Cardiovascular: Denies palpitation, chest discomfort Gastrointestinal:  Denies nausea, heartburn or change in bowel habits Skin: Denies abnormal skin rashes Lymphatics: Denies new lymphadenopathy or easy bruising Neurological:Denies numbness, tingling or new weaknesses Behavioral/Psych: Mood is stable, no new changes  Extremities: No lower extremity edema Breast: Complaining that the right breast is smaller than the left All other systems were reviewed with the patient and are negative.  I have reviewed the past medical history, past surgical history, social history and family history with the patient and they are unchanged from previous note.  ALLERGIES:  is allergic to codeine.  MEDICATIONS:  Current Outpatient Medications  Medication Sig Dispense Refill  . acetaminophen (TYLENOL) 500 MG tablet Per bottle as needed    . albuterol (VENTOLIN HFA) 108 (90 Base) MCG/ACT inhaler Inhale 2 puffs into the lungs every 6 (six) hours as needed for wheezing or shortness of breath. 1 Inhaler 2  . anastrozole (ARIMIDEX) 1 MG tablet Take 1  tablet (1 mg total) by mouth daily. 90 tablet 3  . aspirin EC 81 MG tablet Take 81 mg by mouth daily.    Marland Kitchen atorvastatin (LIPITOR) 20 MG tablet Take 1 tablet (20 mg total) by mouth daily. 90 tablet 3  . famotidine (PEPCID) 20 MG tablet Take 20 mg by mouth at bedtime.    . fluticasone (FLONASE) 50 MCG/ACT nasal spray Place 2 sprays into both nostrils  daily as needed for allergies. 16 g 11  . gabapentin (NEURONTIN) 300 MG capsule Take 2 capsules (600 mg total) by mouth at bedtime. 2 at bedtime 180 capsule 3  . hydroxychloroquine (PLAQUENIL) 200 MG tablet Take 1 tablet (200 mg total) by mouth daily. 30 tablet 11  . metoprolol tartrate (LOPRESSOR) 25 MG tablet Take 1 tablet (25 mg total) by mouth 2 (two) times daily. 60 tablet 11  . Multiple Vitamin (MULTIVITAMIN) capsule Take 1 capsule by mouth daily.    Marland Kitchen omeprazole (PRILOSEC) 20 MG capsule TAKE 1 CAPSULE BY MOUTH ONCE DAILY 30 capsule 5  . ondansetron (ZOFRAN) 4 MG tablet Take 1 tablet (4 mg total) by mouth every 8 (eight) hours as needed for nausea or vomiting. 10 tablet 0  . OXYGEN 2lpm with rest and 3-4 lpm with exertion    . predniSONE (DELTASONE) 5 MG tablet TAKE 4 TABLETS BY MOUTH ONCE DAILY UNTIL BETTER THEN TAKE 2 TABLETS BY MOUTH DAILY FOR 5 DAYS THEN TAKE 1 TAB DAILY 100 tablet 1  . sertraline (ZOLOFT) 50 MG tablet Take 1 tablet (50 mg total) by mouth daily. 90 tablet 0  . Tiotropium Bromide-Olodaterol (STIOLTO RESPIMAT) 2.5-2.5 MCG/ACT AERS Inhale 2 puffs into the lungs daily. 1 Inhaler 11  . triamterene-hydrochlorothiazide (MAXZIDE-25) 37.5-25 MG tablet TAKE 1 TABLET BY MOUTH ONCE DAILY 30 tablet 11   No current facility-administered medications for this visit.     PHYSICAL EXAMINATION: ECOG PERFORMANCE STATUS: 1 - Symptomatic but completely ambulatory  Vitals:   02/06/18 1355  BP: (!) 151/68  Pulse: 72  Resp: 17  Temp: 98.5 F (36.9 C)  SpO2: 92%   Filed Weights   02/06/18 1355  Weight: 207 lb 3.2 oz (94 kg)    GENERAL:alert, no distress and comfortable SKIN: skin color, texture, turgor are normal, no rashes or significant lesions EYES: normal, Conjunctiva are pink and non-injected, sclera clear OROPHARYNX:no exudate, no erythema and lips, buccal mucosa, and tongue normal  NECK: supple, thyroid normal size, non-tender, without nodularity LYMPH:  no palpable  lymphadenopathy in the cervical, axillary or inguinal LUNGS: clear to auscultation and percussion with normal breathing effort HEART: regular rate & rhythm and no murmurs and no lower extremity edema ABDOMEN:abdomen soft, non-tender and normal bowel sounds MUSCULOSKELETAL:no cyanosis of digits and no clubbing  NEURO: alert & oriented x 3 with fluent speech, no focal motor/sensory deficits EXTREMITIES: No lower extremity edema  LABORATORY DATA:  I have reviewed the data as listed CMP Latest Ref Rng & Units 01/10/2018 09/08/2017 09/08/2017  Glucose 70 - 99 mg/dL 86 94 -  BUN 6 - 23 mg/dL 13 13 -  Creatinine 0.40 - 1.20 mg/dL 0.84 0.81 0.87  Sodium 135 - 145 mEq/L 142 140 -  Potassium 3.5 - 5.1 mEq/L 3.8 3.5 -  Chloride 96 - 112 mEq/L 97 97(L) -  CO2 19 - 32 mEq/L 40(H) 32 -  Calcium 8.4 - 10.5 mg/dL 10.2 9.6 -  Total Protein 6.0 - 8.3 g/dL 8.1 - -  Total Bilirubin 0.2 - 1.2 mg/dL  0.4 - -  Alkaline Phos 39 - 117 U/L 67 - -  AST 0 - 37 U/L 15 - -  ALT 0 - 35 U/L 11 - -    Lab Results  Component Value Date   WBC 7.7 01/10/2018   HGB 10.7 (L) 01/10/2018   HCT 32.9 (L) 01/10/2018   MCV 79.0 01/10/2018   PLT 309.0 01/10/2018   NEUTROABS 5.9 01/10/2018    ASSESSMENT & PLAN:  Breast cancer of upper-outer quadrant of right female breast ([place]) Right breast invasive ductal carcinoma status post lumpectomy on 08/18/2015, grade 2, 0.6 cm, with DCIS high-grade, margins negative, 0/2 lymph nodes negative, ER 100%, PR positive, HER-2 negative, Ki-67 20%, T1bN0 stage IA,  Adjuvant radiation from 09/21/2015 to 10/20/2015  Current treatment: Adjuvant antiestrogen therapy with anastrozole 1 mg daily 5 years started January 2017  Severe hot flashes: On Neurontin 600 mg at bedtime.  This has controlled her hot flashes very well.  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 Breast exam 02/06/2017: Normal 2. Mammogram March  2019: Benign breast density category B  Return to clinic in 1 year for follow-up   I spent 25 minutes talking to the patient of which more than half was spent in counseling and coordination of care.  No orders of the defined types were placed in this encounter.  The patient has a good understanding of the overall plan. she agrees with it. she will call with any problems that may develop before the next visit here.   Harriette Ohara, MD 02/06/18

## 2018-02-06 NOTE — Assessment & Plan Note (Signed)
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  This has controlled her hot flashes very well.  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 Breast exam 02/06/2017: Normal 2. Mammogram March 2019: Benign breast density category B  Return to clinic in 1 year for follow-up

## 2018-02-08 ENCOUNTER — Encounter: Payer: BLUE CROSS/BLUE SHIELD | Admitting: Internal Medicine

## 2018-02-11 ENCOUNTER — Other Ambulatory Visit: Payer: Self-pay

## 2018-02-11 ENCOUNTER — Encounter: Payer: Self-pay | Admitting: Internal Medicine

## 2018-02-11 ENCOUNTER — Ambulatory Visit (INDEPENDENT_AMBULATORY_CARE_PROVIDER_SITE_OTHER): Payer: BLUE CROSS/BLUE SHIELD | Admitting: Internal Medicine

## 2018-02-11 VITALS — BP 128/64 | HR 90 | Temp 98.7°F | Ht 65.0 in | Wt 207.6 lb

## 2018-02-11 DIAGNOSIS — F329 Major depressive disorder, single episode, unspecified: Secondary | ICD-10-CM | POA: Diagnosis not present

## 2018-02-11 DIAGNOSIS — F32A Depression, unspecified: Secondary | ICD-10-CM

## 2018-02-11 DIAGNOSIS — Z0000 Encounter for general adult medical examination without abnormal findings: Secondary | ICD-10-CM

## 2018-02-11 NOTE — Progress Notes (Signed)
Subjective:    [name] - 57 y.o. female MRN [id number]  Date of birth: 11/02/1961  HPI  [name] is here for annual exam.  Depression: Patient reports that her mood has been very down for the past several months. She no longer has a car and is essentially confined to her home. Reports that she tries to keep busy with activities such as knitting but that she is getting "tired of looking at those four walls". Has struggled to make it to doctors' appointments and had to stop going to pulmonary rehab due to lack of transportation. Feels like ever since she became ill that people do not call or visit her. Realized that people were in her life because they needed something from her and now that she can't provide that, many people are no longer around in her life. Her sister does call each day. Finds a lot of strength in prayer. Began to fill out a PHQ-9 with score of 12 for questions 2-5; however, then declined to fill out the rest. Denies suicidal ideation. Takes Zoloft 50 mg nightly. Reports she thinks this prevents her from sleeping well.   Social Hx:  Exercises regularly with walking and stationary bike. Previous smoker.  No alcohol or drug use.   ROS:  Patient reports no  vision/ hearing changes,anorexia, weight change, fever ,adenopathy, persistant / recurrent hoarseness, swallowing issues, chest pain, edema,persistant / recurrent cough, hemoptysis, dyspnea(rest, exertional, paroxysmal nocturnal), gastrointestinal  bleeding (melena, rectal bleeding), abdominal pain, excessive heart burn, GU symptoms(dysuria, hematuria, pyuria, voiding/incontinence  Issues) syncope, focal weakness, severe memory loss, concerning skin lesions, anxiety, abnormal bruising/bleeding, major joint swelling, breast masses or abnormal vaginal bleeding.    Health Maintenance:  There are no preventive care reminders to display for this patient.  -  reports that she quit smoking about 14 years ago. She has a  20.00 pack-year smoking history. She has never used smokeless tobacco. - Review of Systems: Per HPI. - Past Medical History: Patient Active Problem List   Diagnosis Date Noted  . Depression   . Obesity (BMI 30-39.9) 01/10/2017  . Dyspnea on exertion 11/09/2016  . Chronic respiratory failure with hypoxia and hypercapnia ([place]) 10/29/2016  . Malnutrition of moderate degree 10/10/2016  . Acute on chronic respiratory failure ([place]) 10/09/2016  . Depression with anxiety 08/07/2016  . Overweight (BMI 25.0-29.9) 05/11/2016  . Breast cancer of upper-outer quadrant of right female breast ([place]) 08/04/2015  . GERD (gastroesophageal reflux disease) 12/21/2012  . Hyperlipidemia 12/21/2012  . SICKLE-CELL TRAIT 04/15/2008  . Sarcoidosis ([place]) 01/10/2007  . HYPERTENSION, BENIGN SYSTEMIC 01/10/2007  . 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  Psych: good insight, alert and oriented  Assessment & Plan:   1. Annual physical exam Patient is up to date on health maintenance topics. Reviewed appropriate counseling for age.  2. Depression, unspecified depression type Patient with several life stressors impacting mood. Discussed that it is an appopriate reaction to feel isolated due to lack of reliable transportation. Have asked patient to be seen by [facility] with [place]. Will assess for appropriateness of SCAT. Suspect  she would qualify due to need for supplemental O2. Have recommended changing Zoloft to taking in morning as AE of insomnia has been reported in 20% of patients. Could consider dose increase in the future if insomnia improves with change in medication schedule. No acute safety concerns at present.    Phill Myron, D.O. 02/11/2018, 10:18 AM PGY-3, [place]

## 2018-02-11 NOTE — Patient Instructions (Signed)
Thank you so much for coming in today. I am sorry to hear about your struggles with transportation and about feeling isolated. That is very hard. I am hopeful that talking with behavioral health today in our clinic will get you some resources. Try taking your Zoloft in the morning. This may help you sleep better at nighttime.

## 2018-02-19 ENCOUNTER — Ambulatory Visit (INDEPENDENT_AMBULATORY_CARE_PROVIDER_SITE_OTHER): Payer: BLUE CROSS/BLUE SHIELD | Admitting: Internal Medicine

## 2018-02-19 ENCOUNTER — Encounter: Payer: Self-pay | Admitting: Internal Medicine

## 2018-02-19 ENCOUNTER — Other Ambulatory Visit (INDEPENDENT_AMBULATORY_CARE_PROVIDER_SITE_OTHER): Payer: BLUE CROSS/BLUE SHIELD

## 2018-02-19 VITALS — BP 142/80 | HR 70 | Ht 65.0 in | Wt 206.0 lb

## 2018-02-19 DIAGNOSIS — R05 Cough: Secondary | ICD-10-CM

## 2018-02-19 DIAGNOSIS — J9611 Chronic respiratory failure with hypoxia: Secondary | ICD-10-CM | POA: Diagnosis not present

## 2018-02-19 DIAGNOSIS — J9612 Chronic respiratory failure with hypercapnia: Secondary | ICD-10-CM

## 2018-02-19 DIAGNOSIS — R053 Chronic cough: Secondary | ICD-10-CM

## 2018-02-19 DIAGNOSIS — J449 Chronic obstructive pulmonary disease, unspecified: Secondary | ICD-10-CM

## 2018-02-19 DIAGNOSIS — D869 Sarcoidosis, unspecified: Secondary | ICD-10-CM | POA: Diagnosis not present

## 2018-02-19 LAB — PULMONARY FUNCTION TEST
DL/VA % pred: 56 %
DL/VA: 2.78 ml/min/mmHg/L
DLCO unc % pred: 39 %
DLCO unc: 10.06 ml/min/mmHg
FEF 25-75 Post: 0.27 L/s
FEF 25-75 Pre: 0.24 L/s
FEF2575-%Change-Post: 11 %
FEF2575-%Pred-Post: 12 %
FEF2575-%Pred-Pre: 10 %
FEV1-%Change-Post: 1 %
FEV1-%Pred-Post: 29 %
FEV1-%Pred-Pre: 28 %
FEV1-Post: 0.65 L
FEV1-Pre: 0.64 L
FEV1FVC-%Change-Post: 1 %
FEV1FVC-%Pred-Pre: 53 %
FEV6-%Change-Post: 2 %
FEV6-%Pred-Post: 53 %
FEV6-%Pred-Pre: 52 %
FEV6-Post: 1.47 L
FEV6-Pre: 1.44 L
FEV6FVC-%Change-Post: 2 %
FEV6FVC-%Pred-Post: 100 %
FEV6FVC-%Pred-Pre: 98 %
FVC-%Change-Post: 0 %
FVC-%Pred-Post: 53 %
FVC-%Pred-Pre: 53 %
FVC-Post: 1.51 L
FVC-Pre: 1.51 L
Post FEV1/FVC ratio: 43 %
Post FEV6/FVC ratio: 97 %
Pre FEV1/FVC ratio: 42 %
Pre FEV6/FVC Ratio: 95 %

## 2018-02-19 LAB — HEPATIC FUNCTION PANEL
ALT: 14 U/L (ref 0–35)
AST: 20 U/L (ref 0–37)
Albumin: 3.8 g/dL (ref 3.5–5.2)
Alkaline Phosphatase: 66 U/L (ref 39–117)
Bilirubin, Direct: 0.1 mg/dL (ref 0.0–0.3)
Total Bilirubin: 0.3 mg/dL (ref 0.2–1.2)
Total Protein: 8.4 g/dL — ABNORMAL HIGH (ref 6.0–8.3)

## 2018-02-19 LAB — SEDIMENTATION RATE: Sed Rate: 94 mm/h — ABNORMAL HIGH (ref 0–30)

## 2018-02-19 NOTE — Assessment & Plan Note (Signed)
Quit smoking 2005  - PFT's 06/26/08 FEV1 1.09 ratio  - PFTs 03/02/10 FEV1 1.06 ratio 34    PFT's 08/01/2011 FEV1 1.6 (42%)  41 ratio  DLCO 55 corrects 77 - Spiriva trial March 02, 2010 > ? better but not worse off it 04/2011 - 05/12/2015 p extensive coaching HFA effectiveness =    90%  - Referred to Rehab 08/21/2014 > could not arrange due to schedule  -med calendar 06/16/2013 > did not bring to office as requested 12/19/13 or 04/23/14  Or 08/21/2014  - 11/09/2016    changed symb to bevespi since taking chronic pred for pf    - 02/20/2017 decreased pred to 5 mg daily > weaned to 2.5 mg qd as of 07/17/2017  - started rehab mid Aug 2018  - 07/17/2017 changed to pred 2.5 even days > did not tolerate so changed  Around  07/28/17 back to 5 mg qod  - 09/03/2017  After extensive coaching HFA effectiveness =    90% so try stiolto  - Spirometry 09/03/2017  FEV1 0.52 (23%)  Ratio 40 with typical curvature    - PFT's  02/19/2018  FEV1 0.65 (29 % ) ratio 43  p 1 % improvement from saba p stiolto/ pred 5 mg  prior to study with DLCO  39 % corrects to 56  % for alv volume    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  The goal with a chronic steroid dependent illness is always arriving at the lowest effective dose that controls the disease/symptoms and not accepting a set "formula" which is based on statistics or guidelines that don't always take into account patient  variability or the natural hx of the dz in every individual patient, which may well vary over time.  For now therefore I recommend the patient maintain  A ceiling of 10 mg per day and floor of 5 mg per day using plaquenil to reduce steroid dep if possible  I had an extended discussion with the patient reviewing all relevant studies completed to date and  lasting 15 to 20 minutes of a 25 minute visit    Each maintenance  medication was reviewed in detail including most importantly the difference between maintenance and prns and under what circumstances the prns are to be triggered using an action plan format that is not reflected in the computer generated alphabetically organized AVS but trather by a customized med calendar that reflects the AVS meds with confirmed 100% correlation.   In addition, Please see AVS for unique instructions that I personally wrote and verbalized to the the pt in detail and then reviewed with pt  by my nurse highlighting any  changes in therapy recommended at today's visit to their plan of care.

## 2018-02-19 NOTE — Assessment & Plan Note (Signed)
New start 09/2016 at admit see records - 10/18/16 Patient Saturations on Room Air at Rest = 92% Patient Saturations on Room Air while Ambulating = 84% Patient Saturations on 2 Liters of oxygen while Ambulating = 90% - HCO3  11/09/2016 = 39  - HCO3   03/16/17        = 34  - HC03    01/10/2018  = 40   As of  02/19/2018  rec 2lpm sleeping and titrate with ambulation to maintain > 90%

## 2018-02-19 NOTE — Assessment & Plan Note (Signed)
Sinus CT 01/08/2018 >>> wnl   Improved on gerd rx and rx for sarcoid

## 2018-02-19 NOTE — Patient Instructions (Signed)
Please remember to go to the lab department downstairs in the basement  for your tests - we will call you with the results when they are available.      See calendar for specific medication instructions and bring it back for each and every office visit for every healthcare provider you see.  Without it,  you may not receive the best quality medical care that we feel you deserve.  You will note that the calendar groups together  your maintenance  medications that are timed at particular times of the day.  Think of this as your checklist for what your doctor has instructed you to do until your next evaluation to see what benefit  there is  to staying on a consistent group of medications intended to keep you well.  The other group at the bottom is entirely up to you to use as you see fit  for specific symptoms that may arise between visits that require you to treat them on an as needed basis.  Think of this as your action plan or "what if" list.   Separating the top medications from the bottom group is fundamental to providing you adequate care going forward.    Please schedule a follow up visit in 3 months but call sooner if needed

## 2018-02-19 NOTE — Progress Notes (Signed)
PFT completed 02/19/18  

## 2018-02-19 NOTE — Progress Notes (Signed)
Subjective:    Patient ID: [name], female   DOB: [birth year]    MRN: [id number]    Brief patient profile:  57 yobf quit smoking 10/2004 dx by transbronchial biopsy 5/92 with NCG inflammatory change consistent with sarcoid. Since that time she's been off and on prednisone multiple  times with flares of coughing dyspnea and skin involvement > weaned off chronic prednisone Feb 2012 and placed back on 10/10/16 for deteriorating obstructive changes on pfts  With resp failure/ 02 dep        History of Present Illness   02/20/2017  f/u ov/ re:   GOLD III/ pred 10 mg daily and bevespi 2bid  and alb rare  Chief Complaint  Patient presents with  . Follow-up    6wk rov. pt states breathing is baseline. pt reports of sob with exertion & occ prod cough with green mucus mainly in the morning   Doe better :  [facility] = can't walk a nl pace on a flat grade s sob but does fine slow and flat eg shopping on  2lpm / ok at rest s 02  Uses 02 2lpm sleeping  rec Drop prednisone to 10 mg one half daily      NP eval 05/14/17  We will decrease your prednisone to 2.5 mg daily. We will prescribe 5 mg tablets. We will renew your prescription for Symbicort.  2 puffs twice daily. Rinse mouth after use. Follow up appointment with Dr. Melvyn Novas in 2 months. We will refer you to Pulmonary rehab > started Aug 2018  Referral to a dietician for weight loss >  at rehab     07/17/2017  f/u ov/ re:   Sarcoid/ ? Still steroid dep at 2.69m daily / 02 none at rest, 2lpm with activity and 3lpm ex  Chief Complaint  Patient presents with  . Follow-up    COPD, still using Symbicort, still SOB with exertion, she has to increase her oxygen to 3L   Doing 3lpm when exercising at rehab and no need for saba on symb 160 2bid  Keeping up with med calendar much better  rec Try prednisone 5 mg take one half on even days only Make a food diary for your dietician to review (list everything you eat/drink x one week for her  feedback on what you are doing right vs wrong) See Tammy NP 3 months with all your medications    09/03/2017  f/u ov/ re: disability paperwork on 5 mg even days with h/o sarcoid and severe copd  Chief Complaint  Patient presents with  . Follow-up    Pt states that her breathing has improved with pulmonary rehab. She is taking 5 mg pred every other day and using ventolin 3-4 x per wk.    last worked Oct 04 2016  02 2lpm sitting/ 3lpm with activity and 4lpm with rehab  Can do 15 min on new step machine  s stopping on "5"  And stationery bike at home x 15 min at "3"  Had worked [employer]/ carrying 5lb to desk fiinish it  =  100 ft and handed to next person  Lint was bad at workplace and seemed to aggravate cough/ sob  maint symb 160 2bid bid and use saba qod when goes outside eg to mailbox  Has had independent eval x one month prior to OV   Most am's sob going to br before am symbicort/sleeping ok on  on one pillow  rec Plan A =  Automatic =    Stiolto 2 puffs each am  Work on inhaler technique:  Let me know if too expensive on your insurance Plan B = Backup Only use your albuterol as a rescue medication   Keep appt with Tammy for 10/17/17    Date of Admission: 09/07/2017                    Date of Discharge: 09/09/17    Discharge Diagnoses/Problem List:  COPD GOLD stage IV HTN Anemia HLD Breast cancer s/p resection Sarcoidosis GERD  Disposition: home  Discharge Condition: improved  Discharge Exam: see progress note from day of discharge  Brief Hospital Course:  Patient presented with substernal acute chest pressure and subjective heart fluttering x 1 day. Atypical symptoms - occurred at rest and did not change with exertion. No hx of similar, non radiating. In the ED, patient received aspirin 325 mg once. Patient was also given a GI cocktail for nausea.Chest x-ray was unremarkable. Troponins were negative 2. CBC and BMP were within normal limits. EKG without  signs of ischemia. Cardiology was consulted, recommended continued medical optimization and outpatient stress test. Mild anemia noted, anemia panel with borderline low ferritin and iron. Hx of iron deficiency anemia, not on supplementation. Given "heart fluttering," TSH was checked and normal. Patient mentioned depression and anxiety combined with stress of medical burden, desired starting SSRI. Zoloft was started as no hx of mania.   Issues for Follow Up:  1. ACS - cardiology (Dr. Einar Gip) recommended outpatient stress testing. Patient amenable to this.  2. Remainder of medications were continued from home. Patient recently changed to Stiolto per pulm, would ensure she is using this correctly.  3. Zoloft was started for depression, given 90 day supply.   Significant Procedures: none  Significant Labs and Imaging:   LastLabs   Recent Labs Lab 09/07/17 1516 09/08/17 0019 09/08/17 0649  WBC 7.5 6.2 6.1  HGB 11.4* 10.7* 10.5*  HCT 35.9* 33.8* 33.1*  PLT 303 286 291      LastLabs   Recent Labs Lab 09/07/17 1516 09/08/17 0019 09/08/17 0649  NA 140  --  140  K 4.5  --  3.5  CL 98*  --  97*  CO2 32  --  32  GLUCOSE 105*  --  94  BUN 11  --  13  CREATININE 0.91 0.87 0.81  CALCIUM 9.8  --  9.6     Trop neg x 3.   Results/Tests Pending at Time of Discharge: none  Discharge Medications:      Allergies as of 09/09/2017      Reactions   Codeine Other (See Comments)   Avoids-felt bad when took before              Medication List     STOP taking these medications   budesonide-formoterol 160-4.5 MCG/ACT inhaler Commonly known as:  SYMBICORT   diphenhydrAMINE 25 MG tablet Commonly known as:  BENADRYL   ibuprofen 600 MG tablet Commonly known as:  ADVIL,MOTRIN         09/28/2017  f/u ov/ re: post hosp f/u  02 2lpm 24/7  Chief Complaint  Patient presents with  . Follow-up    Pt states had to go to ED 09/07/17 with CP- resolved after  nitro. She c/o increased cough with thick, green sputum. She has some bumps under her nose and on both knees- itchy. She has had some blurred vision and redness- seen PCP and dxed  with pink eye- no better after txed with eye gtts they gave her.   on omeprazole 20 before bfast and Pepcid 20 mg at time of admit with dx "gerd induced cp" On the 2.5 qod dosing for sarcoid has had progressive decline and now on 02 24/7/ new skin changes typical for sarcoid under nose and on knees plus am cough x sev weeks green mucus assoc with nasal congestion   rec With cough/ shortness/chest pain > omperzole 40 mg Take 30- 60 min before your first and last meals of the day and pepcid 20 mg at bedtime  Augmentin 875 mg take one pill twice daily  X 10 days - Prednisone 20 mg daily until better, then 10 mg daily x 5 days, then 103m daily     11/26/2017  f/u ov/ re:  Sarcoid/skin involvement chronic cough ? Sinus dz/ using med calendar well / pednisone 10 mg daily  Chief Complaint  Patient presents with  . Follow-up    Cough has improved some, but still producing some greenish yellow sputum first thing in the am.  She has not had to use her albuterol inhaler. She c/o shakiness in her hands for the past couple of months.    less cough p augmentin but still am sputum green / no noct symptoms Doing well with rehab but sometimes has to turn 02 up to 4 lpm there but doesn't do so in other settings and concerned about wt gain  rec Start plaquenil 200 mg daily  Ok to adjust the prednisone 553m from 1-2 pills in am as per med calendar See calendar for specific medication instructions    01/10/2018  f/u ov/ re:  Sarcoid/ plaquenil 200 mg daily plus pred 5 mg daily / last time off was one year prior/ 02 = 2lpm floor and 4lpm with ex Chief Complaint  Patient presents with  . Follow-up    repeat LFT's today. She states she has noticed some shaking in her hands for a while now and forgot to mention this at last ov. Her  breathing is doing well and she is down to 5 mg pred.    Dyspnea:  Ex bike x 15-30 min / low resistance and 02 up to 3-4lpm sats in mid 90s Cough: no Sleep: on 2lpm / one pillow  SABA use:  Not needing  rec Try prednisone 5 mg  Reduce to one half daily to see if not doing as well and if not increase prednisone to 5 mg and call me for higher dose of plaquenil  Late add: with esr high and lfts ok rec bid plaquenil and return in one month for pfts/lfts     02/19/2018  f/u ov/ re:  COPD GOLD IV/ 02 dep/ pred at 110m110maily for sarcoid / could not tol plaqenil  > 200m46m due to N and V Chief Complaint  Patient presents with  . Follow-up    PFT's done today. Breathing is unchanged. She is using her albuterol once daily on average.  Dyspnea:  [employer] slow pace on 3lpm and ex bike x 15 min 3lpm  Cough: some just in am's but better now Sleep: 2lpm one pillow SABA use:  If goes outside    No obvious day to day or daytime variability or assoc excess/ purulent sputum or mucus plugs or hemoptysis or cp or chest tightness, subjective wheeze or overt sinus or hb symptoms. No unusual exposure hx or h/o childhood pna/ asthma or  knowledge of premature birth.  Sleeping ok 1 pillow/ 2 lpm without nocturnal  or early am exacerbation  of respiratory  c/o's or need for noct saba. Also denies any obvious fluctuation of symptoms with weather or environmental changes or other aggravating or alleviating factors except as outlined above   Current Allergies, Complete Past Medical History, Past Surgical History, Family History, and Social History were reviewed in [employer] record.  ROS  The following are not active complaints unless bolded Hoarseness, sore throat, dysphagia, dental problems, itching, sneezing,  nasal congestion or discharge of excess mucus or purulent secretions, ear ache,   fever, chills, sweats, unintended wt loss or wt gain, classically pleuritic or exertional  cp,  orthopnea pnd or leg swelling, presyncope, palpitations, abdominal pain, anorexia, nausea, vomiting, diarrhea  or change in bowel habits or change in bladder habits, change in stools or change in urine, dysuria, hematuria,  rash, arthralgias, visual complaints, headache, numbness, weakness or ataxia or problems with walking or coordination,  change in mood/affect or memory.        Current Meds  Medication Sig  . acetaminophen (TYLENOL) 500 MG tablet Per bottle as needed  . albuterol (VENTOLIN HFA) 108 (90 Base) MCG/ACT inhaler Inhale 2 puffs into the lungs every 6 (six) hours as needed for wheezing or shortness of breath.  . anastrozole (ARIMIDEX) 1 MG tablet Take 1 tablet (1 mg total) by mouth daily.  Marland Kitchen aspirin EC 81 MG tablet Take 81 mg by mouth daily.  Marland Kitchen atorvastatin (LIPITOR) 20 MG tablet Take 1 tablet (20 mg total) by mouth daily.  . famotidine (PEPCID) 20 MG tablet Take 20 mg by mouth at bedtime.  . fluticasone (FLONASE) 50 MCG/ACT nasal spray Place 2 sprays into both nostrils daily as needed for allergies.  Marland Kitchen gabapentin (NEURONTIN) 300 MG capsule Take 2 capsules (600 mg total) by mouth at bedtime. 2 at bedtime  . hydroxychloroquine (PLAQUENIL) 200 MG tablet Take 1 tablet (200 mg total) by mouth daily.  . metoprolol tartrate (LOPRESSOR) 25 MG tablet Take 1 tablet (25 mg total) by mouth 2 (two) times daily.  . Multiple Vitamin (MULTIVITAMIN) capsule Take 1 capsule by mouth daily.  Marland Kitchen omeprazole (PRILOSEC) 20 MG capsule TAKE 1 CAPSULE BY MOUTH ONCE DAILY  . ondansetron (ZOFRAN) 4 MG tablet Take 1 tablet (4 mg total) by mouth every 8 (eight) hours as needed for nausea or vomiting.  . OXYGEN 2lpm with rest and 3-4 lpm with exertion  . predniSONE (DELTASONE) 5 MG tablet TAKE 4 TABLETS BY MOUTH ONCE DAILY UNTIL BETTER THEN TAKE 2 TABLETS BY MOUTH DAILY FOR 5 DAYS THEN TAKE 1 TAB DAILY (Patient taking differently: 5 mg daily)  . sertraline (ZOLOFT) 50 MG tablet Take 1 tablet (50 mg total) by  mouth daily.  . Tiotropium Bromide-Olodaterol (STIOLTO RESPIMAT) 2.5-2.5 MCG/ACT AERS Inhale 2 puffs into the lungs daily.  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 Unice Cobble  -h/o Daily prednisone since 2007   > off completely Feb 2012 with no problem> restarted  09/2016  ? of SUPERFICIAL VEIN THROMBOSIS (ICD-453.9)   ENDOMETRIAL POLYP (ICD-621.0)   UTERINE FIBROID (ICD-218.9)  RHINITIS, ALLERGIC (ICD-477.9)  HYPERTENSION, BENIGN SYSTEMIC (ICD-401.1)  COPD (ICD-496)  - PFT's 06/26/08 FEV1 1.09 ratio  -  PFTs 03/02/10 FEV1 1.06 ratio 34  - PFT's 08/01/2011 FEV1 1.6 (42%)  41 ratio  DLCO 55 corrects 77 - HFA  50% 06/05/2011  > 75% 06/29/2011  - Spiriva trial March 02, 2010 > better but no worse off 04/2011 - Rehab started mid aug 2018  BACK PAIN, LOW (ICD-724.2)  ANEMIA, IRON DEFICIENCY, UNSPEC. (ICD-280.9)  Health Maintenance.......................................................   [facility]   Family History:  crohn`s in daughter, M-lupus, htn, asthma,  no Ca, DM, CAD    Social History:   lives with twin children and grandson. single; >20 pack year history, quit in 2005; no EtOH  Laid off  from works for [employer] Sept 2013           Objective:   Physical Exam wt 166 Oct 12, 2009 >170 October 28, 2010 > 169 06/29/2011 >>171 08/24/2011 > 01/15/2012  171 > 04/24/2012 176 > 06/28/2012  175> 08/13/2012 > 08/30/2012  173 > 170 11/28/2012 > 01/29/2013  176 > 174 >173 06/16/2013 > 171 09/18/2013 > 12/19/2013 177 >179 04/13/2014>  04/23/2014 177 > 08/21/2014 184 >182 11/27/2014 > 05/12/2015 177  > 05/11/2016   177 > 10/27/2016  175 > 11/09/2016 182 > 01/09/2017 198 > 02/20/2017  206 >  07/17/2017   209 >  09/03/2017   210 >  09/28/2017  206  > 11/26/2017   209 > 01/10/2018  210> 02/19/2018  206  amb bf nad  Vital signs reviewed - Note on arrival 02 sats  97% on  3lpm    HEENT: nl dentition, turbinates bilaterally, and oropharynx. Nl external ear canals without cough reflex   NECK :  without JVD/Nodes/TM/ nl carotid upstrokes bilaterally   LUNGS: no acc muscle use,  Nl contour chest with coarsened bs bilaterally with minimal rhonchi bette with plm    CV:  RRR  no s3 or murmur or increase in P2, and no edema   ABD:  soft and nontender with nl inspiratory excursion in the supine position. No bruits or organomegaly appreciated, bowel sounds nl  MS:  Nl gait/ ext warm without deformities, calf tenderness, cyanosis or clubbing No obvious joint restrictions   SKIN: warm and dry without lesions    NEURO:  alert, approp, nl sensorium with  no motor or cerebellar deficits apparent.               Lab Results  Component Value Date   ALT 14 02/19/2018   AST 20 02/19/2018   ALKPHOS 66 02/19/2018   BILITOT 0.3 02/19/2018        Lab Results  Component Value Date   ESRSEDRATE 94 (H) 02/19/2018   ESRSEDRATE 97 Repeated and verified X2. (H) 01/10/2018   ESRSEDRATE 52 (H) 11/09/2016                     . Assessment:

## 2018-02-20 ENCOUNTER — Encounter: Payer: Self-pay | Admitting: Internal Medicine

## 2018-02-20 NOTE — Assessment & Plan Note (Signed)
Positive transbronchial biopsy 02/19/91 by Dr. Unice Cobble  -Daily prednisone since 2007 with ? adrenal insufficiency iatrogenic > off completely Feb 2012 with no recurrence - PFT's 06/26/08 FEV1  1.09 ratio  - PFTs  03/02/10 FEV1  1.06 (42%) ratio 34 and no better p B2,  DLC0 48% corrects to 64% - PFT's 08/01/2011 FEV1 1.6 (42%)  41 ratio  DLCO 55 corrects 77 - PFTs 10/10/16    FEV1  0.87(38)ratio 41 and DLCO 36/39c corrects to 51 for alv vol > placed back on daily pred  - 02/20/2017 decreased pred to 5 mg daily > to 2.93m daily as of 07/17/2017  - 07/17/2017 try 2.5 mg qod > 09/28/2017 changed to 20 mg until better and new floor of 5 mg daily plus consider plaquenil due to new skin involvement on face  - opth eval neg 11/10/17 - Start plaquenil 200 mg daily 11/26/2017  - 01/10/2018 ESR up to 97 with pred taper so rec bid plaquenil > could only tol 200 mg daily   - 02/19/2018 ESR = 94 which is nonspecific but does indicate active inflammation that may be sarcoid related. Unfortunately she cannot tolerate the higher dose of Plaquenil.

## 2018-02-20 NOTE — Progress Notes (Signed)
Spoke with pt and notified of results per Dr. Wert. Pt verbalized understanding and denied any questions. 

## 2018-02-21 ENCOUNTER — Other Ambulatory Visit: Payer: Self-pay

## 2018-02-21 ENCOUNTER — Encounter: Payer: Self-pay | Admitting: Student in an Organized Health Care Education/Training Program

## 2018-02-21 ENCOUNTER — Other Ambulatory Visit (HOSPITAL_COMMUNITY)
Admission: RE | Admit: 2018-02-21 | Discharge: 2018-02-21 | Disposition: A | Discharge status: Home / Self Care | Payer: BLUE CROSS/BLUE SHIELD | Source: Ambulatory Visit | Attending: Family Medicine | Admitting: Family Medicine

## 2018-02-21 ENCOUNTER — Ambulatory Visit: Payer: BLUE CROSS/BLUE SHIELD | Admitting: Student in an Organized Health Care Education/Training Program

## 2018-02-21 DIAGNOSIS — N898 Other specified noninflammatory disorders of vagina: Secondary | ICD-10-CM | POA: Diagnosis not present

## 2018-02-21 DIAGNOSIS — Z202 Contact with and (suspected) exposure to infections with a predominantly sexual mode of transmission: Secondary | ICD-10-CM | POA: Diagnosis not present

## 2018-02-21 DIAGNOSIS — A599 Trichomoniasis, unspecified: Secondary | ICD-10-CM

## 2018-02-21 LAB — POCT WET PREP (WET MOUNT): CLUE CELLS WET PREP WHIFF POC: NEGATIVE

## 2018-02-21 MED ORDER — METRONIDAZOLE 500 MG PO TABS: 500.0000 mg | ORAL_TABLET | Freq: Two times a day (BID) | ORAL | 0 refills | Status: DC

## 2018-02-21 NOTE — Assessment & Plan Note (Signed)
Noted on wet prep. - metroNIDAZOLE (FLAGYL) 500 MG tablet; Take 1 tablet (500 mg total) by mouth 2 (two) times daily.  Dispense: 14 tablet; Refill: 0

## 2018-02-21 NOTE — Patient Instructions (Signed)
It was a pleasure seeing you today in our clinic. Here is the treatment plan we have discussed and agreed upon together:  I sent antibiotics to your pharmacy for trichomoniasis.  We drew blood work at today's visit. I will call or send you a letter with these results. If you do not hear from me within the next week, please give our office a call.  Our clinic's number is [phone]. Please call with questions or concerns about what we discussed today.  Be well, Dr. Burr Medico

## 2018-02-21 NOTE — Progress Notes (Signed)
Subjective:    [name] - 57 y.o. female MRN [id number]  Date of birth: [birth year]  HPI  [name] is here for vaginal discharge.  Vaginal discharge - 1 week of vaginal discharge, white, not clumpy - light, white - no burning with urination - no itching - no vaginal pain - no vaginal bleeding - no fevers - no abdominal pain - she states she does not think she is at risk for STI, however has not been tested since prior to her last sexual encounter 4 months ago. - she endorses history of hysterectomy   Health Maintenance:  There are no preventive care reminders to display for this patient.  -  reports that she quit smoking about 14 years ago. She has a 20.00 pack-year smoking history. She has never used smokeless tobacco. - Review of Systems: Per HPI. - Past Medical History: Patient Active Problem List   Diagnosis Date Noted  . Trichimoniasis 02/21/2018  . Chronic cough 11/26/2017  . Depression   . Obesity (BMI 30-39.9) 01/10/2017  . Dyspnea on exertion 11/09/2016  . Chronic respiratory failure with hypoxia and hypercapnia ([place]) 10/29/2016  . Malnutrition of moderate degree 10/10/2016  . Acute on chronic respiratory failure ([place]) 10/09/2016  . Depression with anxiety 08/07/2016  . Overweight (BMI 25.0-29.9) 05/11/2016  . Breast cancer of upper-outer quadrant of right female breast ([place]) 08/04/2015  . Vaginal discharge 05/08/2013  . GERD (gastroesophageal reflux disease) 12/21/2012  . Hyperlipidemia 12/21/2012  . SICKLE-CELL TRAIT 04/15/2008  . Sarcoidosis ([place]) 01/10/2007  . HYPERTENSION, BENIGN SYSTEMIC 01/10/2007  . 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  01/10/2007   - Medications: reviewed and updated Current Outpatient Medications  Medication Sig Dispense Refill  . acetaminophen (TYLENOL) 500 MG tablet Per bottle as needed    . albuterol (VENTOLIN HFA) 108 (90 Base) MCG/ACT inhaler Inhale 2 puffs into the lungs every 6 (six)  hours as needed for wheezing or shortness of breath. 1 Inhaler 2  . anastrozole (ARIMIDEX) 1 MG tablet Take 1 tablet (1 mg total) by mouth daily. 90 tablet 3  . aspirin EC 81 MG tablet Take 81 mg by mouth daily.    Marland Kitchen atorvastatin (LIPITOR) 20 MG tablet Take 1 tablet (20 mg total) by mouth daily. 90 tablet 3  . famotidine (PEPCID) 20 MG tablet Take 20 mg by mouth at bedtime.    . fluticasone (FLONASE) 50 MCG/ACT nasal spray Place 2 sprays into both nostrils daily as needed for allergies. 16 g 11  . gabapentin (NEURONTIN) 300 MG capsule Take 2 capsules (600 mg total) by mouth at bedtime. 2 at bedtime 180 capsule 3  . hydroxychloroquine (PLAQUENIL) 200 MG tablet Take 1 tablet (200 mg total) by mouth daily. 30 tablet 11  . metoprolol tartrate (LOPRESSOR) 25 MG tablet Take 1 tablet (25 mg total) by mouth 2 (two) times daily. 60 tablet 11  . metroNIDAZOLE (FLAGYL) 500 MG tablet Take 1 tablet (500 mg total) by mouth 2 (two) times daily. 14 tablet 0  . Multiple Vitamin (MULTIVITAMIN) capsule Take 1 capsule by mouth daily.    Marland Kitchen omeprazole (PRILOSEC) 20 MG capsule TAKE 1 CAPSULE BY MOUTH ONCE DAILY 30 capsule 5  . ondansetron (ZOFRAN) 4 MG tablet Take 1 tablet (4 mg total) by mouth every 8 (eight) hours as needed for nausea or vomiting. 10 tablet 0  . OXYGEN 2lpm with rest and 3-4 lpm with exertion    . predniSONE (DELTASONE) 5 MG  tablet TAKE 4 TABLETS BY MOUTH ONCE DAILY UNTIL BETTER THEN TAKE 2 TABLETS BY MOUTH DAILY FOR 5 DAYS THEN TAKE 1 TAB DAILY (Patient taking differently: 5 mg daily) 100 tablet 1  . sertraline (ZOLOFT) 50 MG tablet Take 1 tablet (50 mg total) by mouth daily. 90 tablet 0  . Tiotropium Bromide-Olodaterol (STIOLTO RESPIMAT) 2.5-2.5 MCG/ACT AERS Inhale 2 puffs into the lungs daily. 1 Inhaler 11  . triamterene-hydrochlorothiazide (MAXZIDE-25) 37.5-25 MG tablet TAKE 1 TABLET BY MOUTH ONCE DAILY 30 tablet 11   No current facility-administered medications for this visit.     Review of  Systems See HPI     Objective:   Physical Exam BP 122/72   Pulse 70   Temp 98.3 F (36.8 C) (Oral)   Wt 206 lb (93.4 kg)   SpO2 97%   BMI 34.28 kg/m  Gen: NAD, alert, cooperative with exam, well-appearing  Female genitalia: Vulva: normal appearing vulva with no masses Vagina: appears normal, mild white discharge is think Cervix: surgically absent per op and pathology reports, though vagina pouches in a way that can appear like a cervix.    Assessment & Plan:   Trichimoniasis Noted on wet prep. - metroNIDAZOLE (FLAGYL) 500 MG tablet; Take 1 tablet (500 mg total) by mouth 2 (two) times daily.  Dispense: 14 tablet; Refill: 0   Vaginal discharge No vaginal symptoms other than discharge, however patient prefers to have an exam. She would like STI testing. Trich found on wet prep (see above). Will call patient with results. - POCT Wet Prep [facility]) - Cervicovaginal ancillary only - HIV antibody (with reflex) - RPR    Orders Placed This Encounter  Procedures  . HIV antibody (with reflex)  . RPR  . POCT Wet Prep [facility])    Meds ordered this encounter  Medications  . metroNIDAZOLE (FLAGYL) 500 MG tablet    Sig: Take 1 tablet (500 mg total) by mouth 2 (two) times daily.    Dispense:  14 tablet    Refill:  0    Erin Coombe, MD,MS,  PGY2 02/21/2018 5:13 PM

## 2018-02-21 NOTE — Assessment & Plan Note (Signed)
No vaginal symptoms other than discharge, however patient prefers to have an exam. She would like STI testing. Trich found on wet prep (see above). Will call patient with results. - POCT Wet Prep [facility]) - Cervicovaginal ancillary only - HIV antibody (with reflex) - RPR

## 2018-02-22 ENCOUNTER — Other Ambulatory Visit: Payer: Self-pay | Admitting: Internal Medicine

## 2018-02-22 DIAGNOSIS — J9611 Chronic respiratory failure with hypoxia: Secondary | ICD-10-CM | POA: Diagnosis not present

## 2018-02-22 DIAGNOSIS — J449 Chronic obstructive pulmonary disease, unspecified: Secondary | ICD-10-CM | POA: Diagnosis not present

## 2018-02-22 LAB — HIV ANTIBODY (ROUTINE TESTING W REFLEX): HIV Screen 4th Generation wRfx: NONREACTIVE

## 2018-02-22 LAB — RPR: RPR Ser Ql: NONREACTIVE

## 2018-02-25 LAB — CERVICOVAGINAL ANCILLARY ONLY
Chlamydia: NEGATIVE
Neisseria Gonorrhea: NEGATIVE

## 2018-03-06 ENCOUNTER — Other Ambulatory Visit: Payer: Self-pay

## 2018-03-07 MED ORDER — SERTRALINE HCL 50 MG PO TABS: 50.0000 mg | ORAL_TABLET | Freq: Every day | ORAL | 0 refills | Status: DC

## 2018-03-24 DIAGNOSIS — J449 Chronic obstructive pulmonary disease, unspecified: Secondary | ICD-10-CM | POA: Diagnosis not present

## 2018-03-24 DIAGNOSIS — J9611 Chronic respiratory failure with hypoxia: Secondary | ICD-10-CM | POA: Diagnosis not present

## 2018-03-28 IMAGING — DX DG CHEST 2V
2 series · 2 of 2 positions shown · non-contrast
Comparison: Chest x-ray dated March 16, 2017.

CLINICAL DATA: Chest pain.

EXAM:
CHEST  2 VIEW

[chest lat]
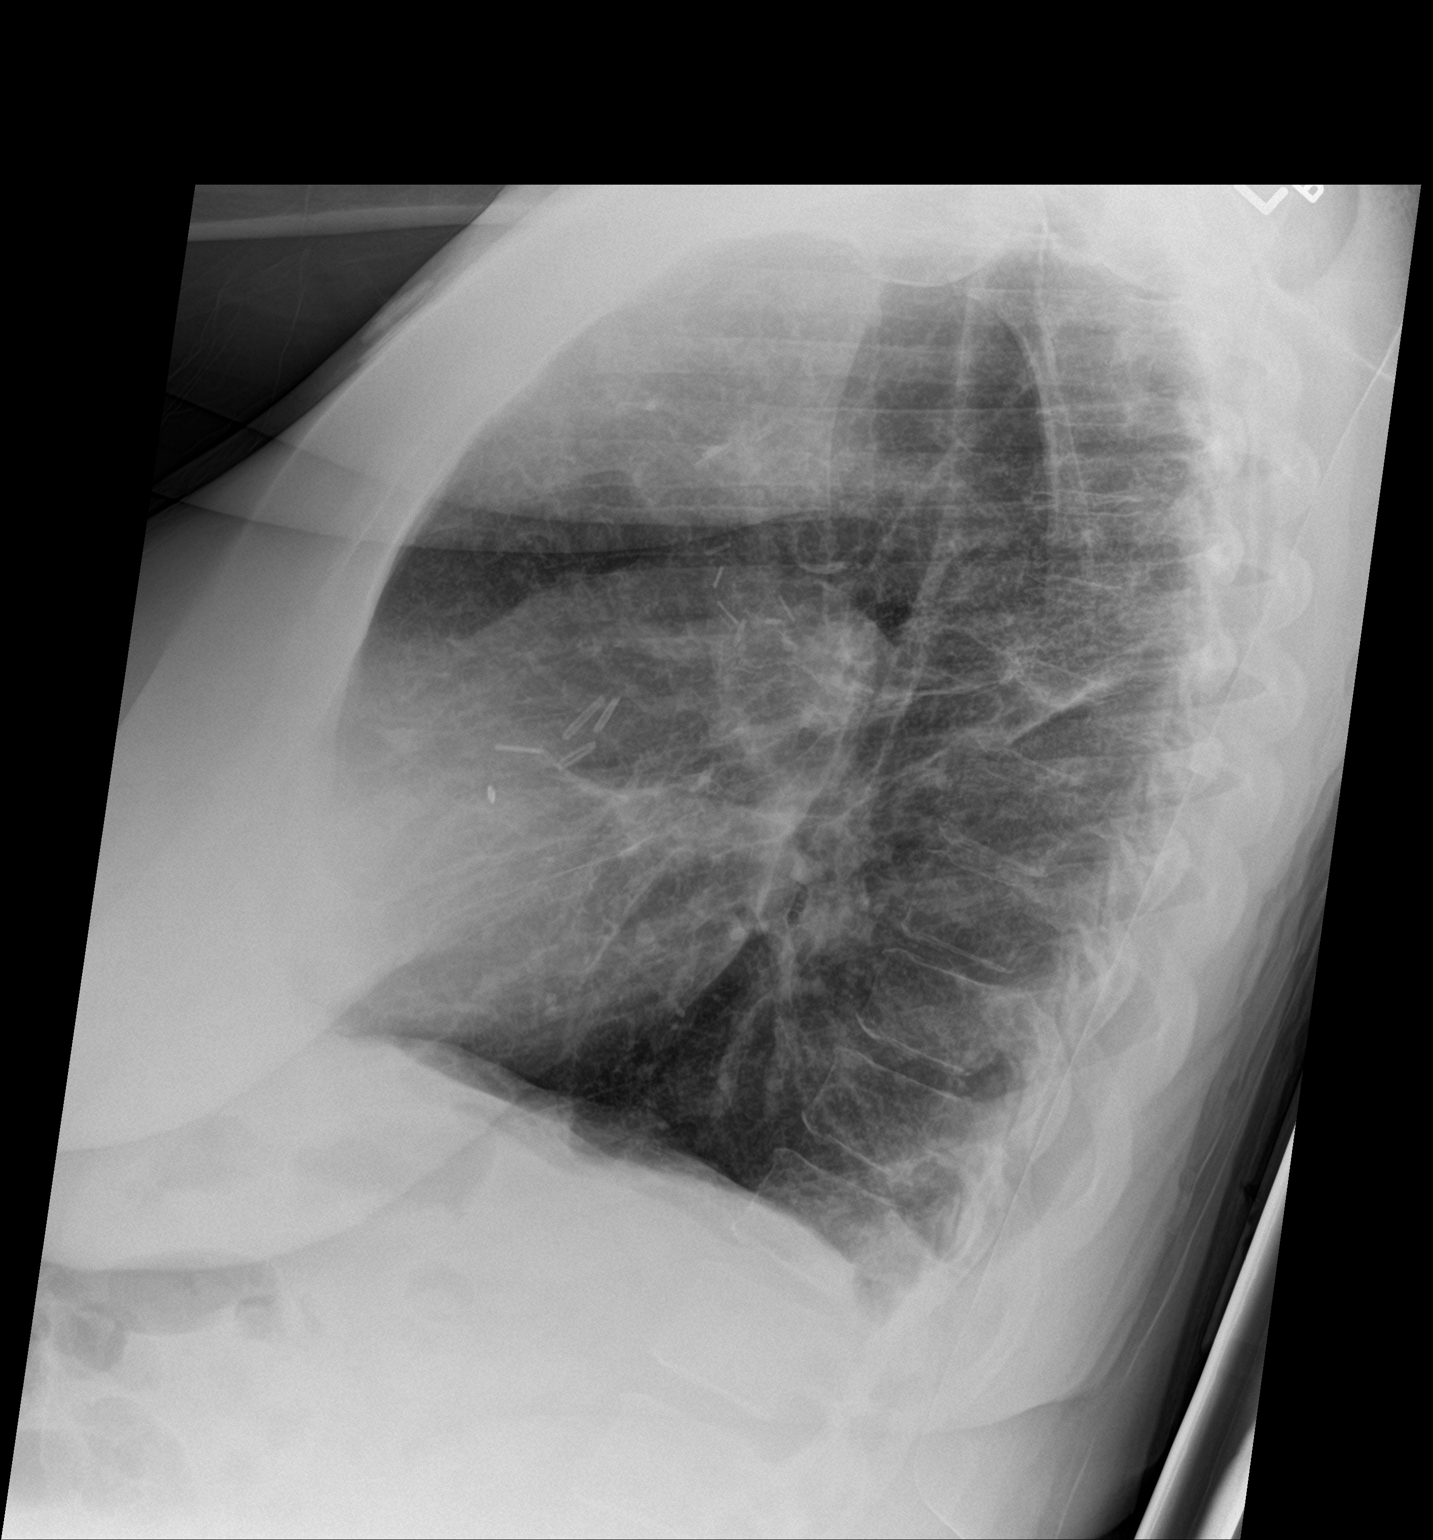

[chest ap]
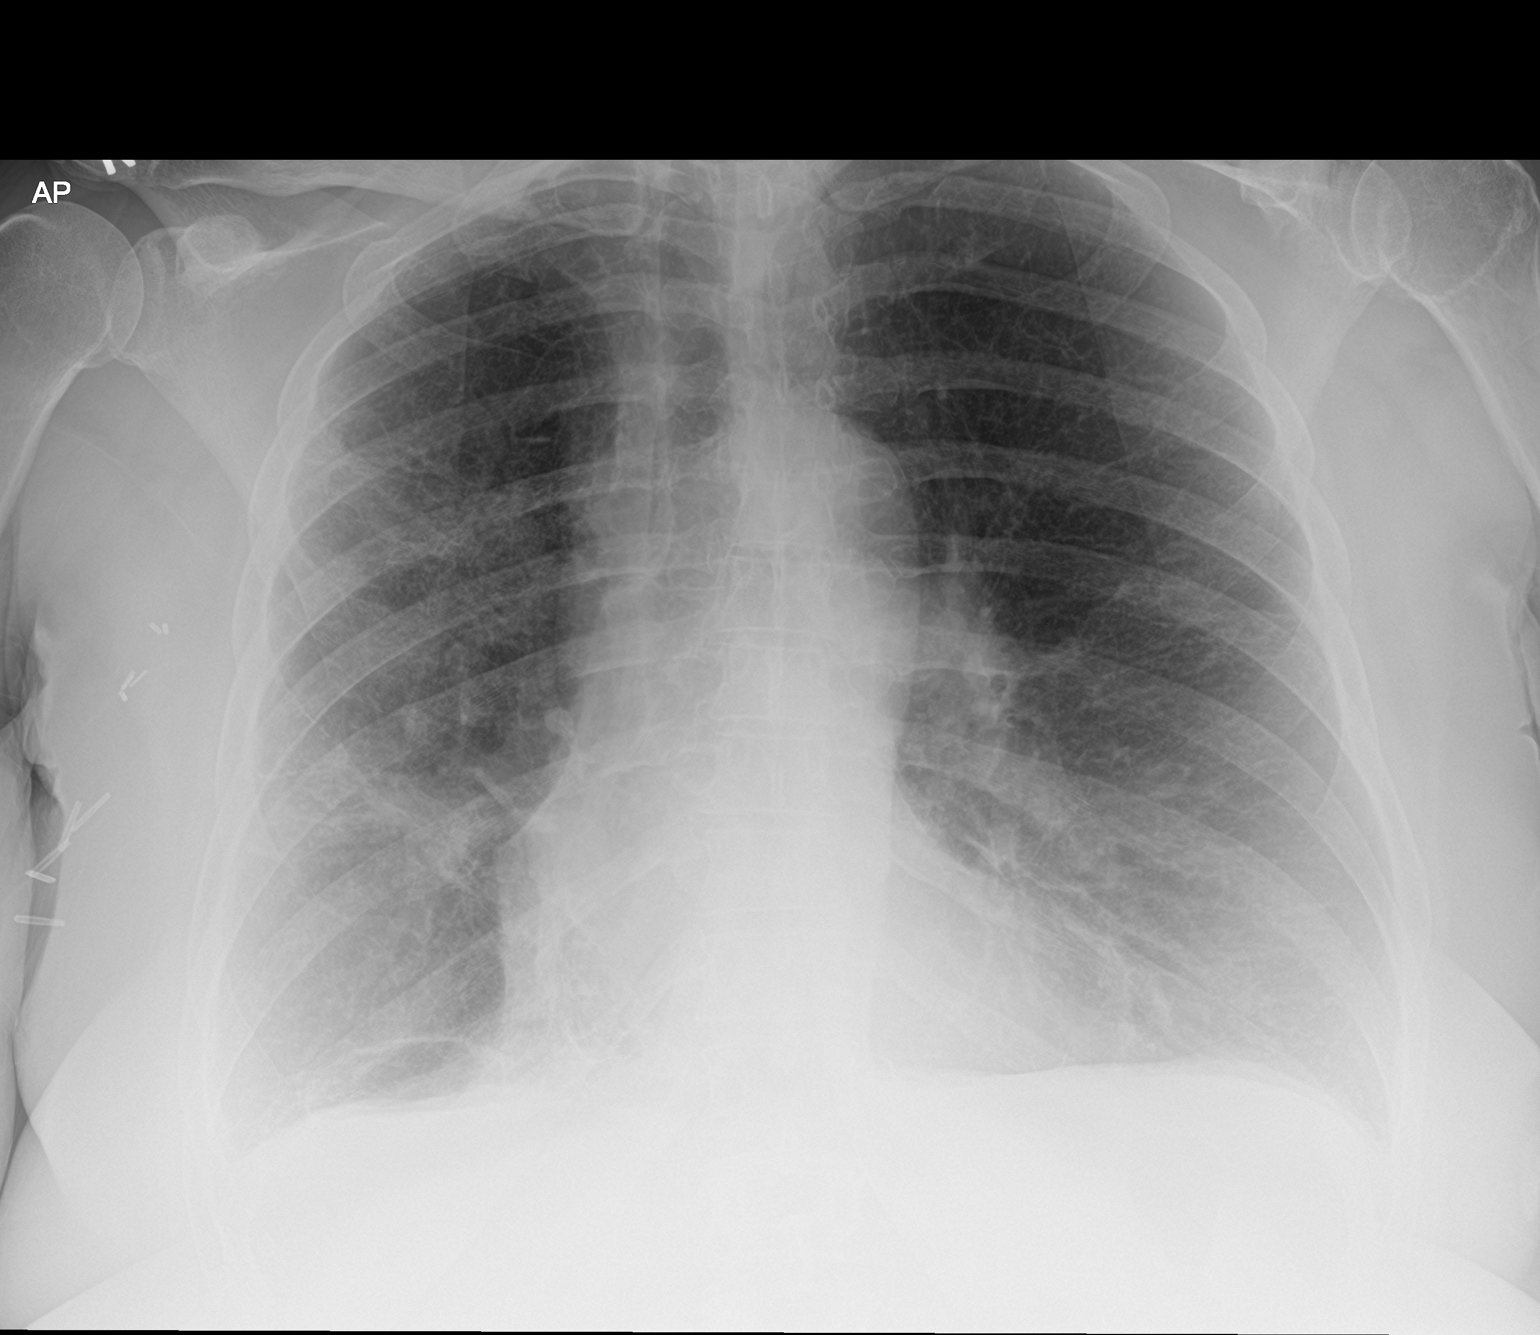

[2 of 2 positions shown; findings below may reference images not displayed]

FINDINGS: The cardiomediastinal silhouette is normal in size. Normal pulmonary
vascularity. Unchanged bibasilar and right middle lobe
scarring/atelectasis. Emphysematous changes. No focal consolidation,
pleural effusion, or pneumothorax. No acute osseous abnormality.
IMPRESSION: No active cardiopulmonary disease.

## 2018-04-09 ENCOUNTER — Ambulatory Visit: Payer: BLUE CROSS/BLUE SHIELD | Admitting: Internal Medicine

## 2018-04-15 ENCOUNTER — Other Ambulatory Visit: Payer: Self-pay | Admitting: Family Medicine

## 2018-04-15 DIAGNOSIS — K219 Gastro-esophageal reflux disease without esophagitis: Secondary | ICD-10-CM

## 2018-04-16 ENCOUNTER — Telehealth: Payer: Self-pay | Admitting: Internal Medicine

## 2018-04-16 NOTE — Telephone Encounter (Signed)
Received forms from [facility] folder, he wanted me to send the paperwork to [employer]. I placed in interoffice mail. Will leave encounter open.

## 2018-04-17 ENCOUNTER — Ambulatory Visit: Payer: BLUE CROSS/BLUE SHIELD | Admitting: Student

## 2018-04-22 NOTE — Telephone Encounter (Signed)
Dr. Melvyn Novas - please advise if these forms have been completed. Thanks.

## 2018-04-23 ENCOUNTER — Telehealth: Payer: Self-pay | Admitting: Internal Medicine

## 2018-04-23 NOTE — Telephone Encounter (Signed)
I called [employer] to get a status update on forms. Sonia Baller at [employer] has received the forms for this pt according to the notes. She sent the forms on 04/19/2018 for Dr. Melvyn Novas to sign. He is on vacation so we will keep this until he returns so we can have him sign and return to [employer].

## 2018-04-24 DIAGNOSIS — J9611 Chronic respiratory failure with hypoxia: Secondary | ICD-10-CM | POA: Diagnosis not present

## 2018-04-24 DIAGNOSIS — J449 Chronic obstructive pulmonary disease, unspecified: Secondary | ICD-10-CM | POA: Diagnosis not present

## 2018-04-24 NOTE — Telephone Encounter (Signed)
Forms in MW's lookat to complete when he returns to the office 04/30/18

## 2018-04-29 NOTE — Telephone Encounter (Signed)
Done

## 2018-04-29 NOTE — Telephone Encounter (Signed)
Forms have been given to [facility] to send back to [employer].   lmtcb for pt to make aware.

## 2018-04-29 NOTE — Telephone Encounter (Signed)
Forms have been returned to [facility] to send back to [employer] per office protocol.

## 2018-04-29 NOTE — Telephone Encounter (Signed)
Rec'd completed forms back from [place]. Sent paperwork to [employer] via interoffice mail -pr

## 2018-04-29 NOTE — Telephone Encounter (Signed)
done

## 2018-05-21 ENCOUNTER — Encounter: Payer: Self-pay | Admitting: Internal Medicine

## 2018-05-21 ENCOUNTER — Ambulatory Visit (INDEPENDENT_AMBULATORY_CARE_PROVIDER_SITE_OTHER): Payer: BLUE CROSS/BLUE SHIELD | Admitting: Internal Medicine

## 2018-05-21 VITALS — BP 116/70 | HR 84 | Ht 65.0 in | Wt 198.0 lb

## 2018-05-21 DIAGNOSIS — D869 Sarcoidosis, unspecified: Secondary | ICD-10-CM

## 2018-05-21 DIAGNOSIS — J9612 Chronic respiratory failure with hypercapnia: Secondary | ICD-10-CM | POA: Diagnosis not present

## 2018-05-21 DIAGNOSIS — J9611 Chronic respiratory failure with hypoxia: Secondary | ICD-10-CM

## 2018-05-21 DIAGNOSIS — J449 Chronic obstructive pulmonary disease, unspecified: Secondary | ICD-10-CM | POA: Diagnosis not present

## 2018-05-21 MED ORDER — ALBUTEROL SULFATE HFA 108 (90 BASE) MCG/ACT IN AERS: 2.0000 | INHALATION_SPRAY | Freq: Four times a day (QID) | RESPIRATORY_TRACT | 2 refills | Status: DC | PRN

## 2018-05-21 NOTE — Progress Notes (Signed)
Subjective:    Patient ID: [name], female   DOB: 01/19/1961    MRN: [id number]    Brief patient profile:  50 yobf quit smoking 10/2004 dx by transbronchial biopsy 5/92 with NCG inflammatory change consistent with sarcoid. Since that time she's been off and on prednisone multiple  times with flares of coughing dyspnea and skin involvement > weaned off chronic prednisone Feb 2012 and placed back on 10/10/16 for deteriorating obstructive changes on pfts  With resp failure/ 02 dep        History of Present Illness   02/20/2017  f/u ov/Kashena Novitski re:   GOLD III/ pred 10 mg daily and bevespi 2bid  and alb rare  Chief Complaint  Patient presents with  . Follow-up    6wk rov. pt states breathing is baseline. pt reports of sob with exertion & occ prod cough with green mucus mainly in the morning   Doe better :  [facility] = can't walk a nl pace on a flat grade s sob but does fine slow and flat eg shopping on  2lpm / ok at rest s 02  Uses 02 2lpm sleeping  rec Drop prednisone to 10 mg one half daily      NP eval 05/14/17  We will decrease your prednisone to 2.5 mg daily. We will prescribe 5 mg tablets. We will renew your prescription for Symbicort.  2 puffs twice daily. Rinse mouth after use. Follow up appointment with Dr. Melvyn Novas in 2 months. We will refer you to Pulmonary rehab > started Aug 2018  Referral to a dietician for weight loss >  at rehab     Date of Admission: 09/07/2017                    Date of Discharge: 09/09/17    Discharge Diagnoses/Problem List:  COPD GOLD stage IV HTN Anemia HLD Breast cancer s/p resection Sarcoidosis GERD     09/28/2017  f/u ov/Camila Maita re: post hosp f/u  02 2lpm 24/7  Chief Complaint  Patient presents with  . Follow-up    Pt states had to go to ED 09/07/17 with CP- resolved after nitro. She c/o increased cough with thick, green sputum. She has some bumps under her nose and on both knees- itchy. She has had some blurred vision and redness-  seen PCP and dxed with pink eye- no better after txed with eye gtts they gave her.   on omeprazole 20 before bfast and Pepcid 20 mg at time of admit with dx "gerd induced cp" On the 2.5 qod dosing for sarcoid has had progressive decline and now on 02 24/7/ new skin changes typical for sarcoid under nose and on knees plus am cough x sev weeks green mucus assoc with nasal congestion   rec With cough/ shortness/chest pain > omperzole 40 mg Take 30- 60 min before your first and last meals of the day and pepcid 20 mg at bedtime  Augmentin 875 mg take one pill twice daily  X 10 days - Prednisone 20 mg daily until better, then 10 mg daily x 5 days, then 20m daily     11/26/2017  f/u ov/Shaynah Hund re:  Sarcoid/skin involvement chronic cough ? Sinus dz/ using med calendar well / pednisone 10 mg daily  Chief Complaint  Patient presents with  . Follow-up    Cough has improved some, but still producing some greenish yellow sputum first thing in the am.  She has not had to use  her albuterol inhaler. She c/o shakiness in her hands for the past couple of months.    less cough p augmentin but still am sputum green / no noct symptoms Doing well with rehab but sometimes has to turn 02 up to 4 lpm there but doesn't do so in other settings and concerned about wt gain  rec Start plaquenil 200 mg daily  Ok to adjust the prednisone 103m  from 1-2 pills in am as per med calendar See calendar for specific medication instructions    01/10/2018  f/u ov/Maryori Weide re:  Sarcoid/ plaquenil 200 mg daily plus pred 5 mg daily / last time off was one year prior/ 02 = 2lpm floor and 4lpm with ex Chief Complaint  Patient presents with  . Follow-up    repeat LFT's today. She states she has noticed some shaking in her hands for a while now and forgot to mention this at last ov. Her breathing is doing well and she is down to 5 mg pred.    Dyspnea:  Ex bike x 15-30 min / low resistance and 02 up to 3-4lpm sats in mid 90s Cough: no Sleep:  on 2lpm / one pillow  SABA use:  Not needing  rec Try prednisone 5 mg  Reduce to one half daily to see if not doing as well and if not increase prednisone to 5 mg and call me for higher dose of plaquenil  Late add: with esr high and lfts ok rec bid plaquenil and return in one month for pfts/lfts> could not tol 400 mg daily due to n and v but ok on 200 mg daily      05/21/2018  f/u ov/Kentley Cedillo re:  codp GOLD IV/ 02 dep on pred 5 mg x 2  daily for sarcoid / intol of plaquenil > 200 mg (N/V) Using med cal well  Chief Complaint  Patient presents with  . Follow-up    Breathing unchanged since the last visit. She has prod cough in the am's with min green sputum. She is using her albuterol inhaler 1 x per wk on average.   Dyspnea:  On 3-4 lpm does [employer] / also able to do ex bike x 15 min  Cough: just in am , slt discolored mucus   SABA use: rare, usually if goes out in hear  02: 2lpm hs and 3-4 lpm daytime     No obvious day to day or daytime variability or assoc excess/ purulent sputum or mucus plugs or hemoptysis or cp or chest tightness, subjective wheeze or overt sinus or hb symptoms.   Sleeping: 2lpm on 1 pillow without nocturnal  or early am exacerbation  of respiratory  c/o's or need for noct saba. Also denies any obvious fluctuation of symptoms with weather or environmental changes or other aggravating or alleviating factors except as outlined above   No unusual exposure hx or h/o childhood pna/ asthma or knowledge of premature birth.  Current Allergies, Complete Past Medical History, Past Surgical History, Family History, and Social History were reviewed in [employer].  ROS  The following are not active complaints unless bolded Hoarseness, sore throat, dysphagia, dental problems, itching, sneezing,  nasal congestion or discharge of excess mucus or purulent secretions, ear ache,   fever, chills, sweats, unintended wt loss or wt gain, classically pleuritic or  exertional cp,  orthopnea pnd or arm/hand swelling  or leg swelling, presyncope, palpitations, abdominal pain, anorexia, nausea, vomiting, diarrhea  or change in bowel  habits or change in bladder habits, change in stools or change in urine, dysuria, hematuria,  rash, arthralgias, visual complaints, headache, numbness, weakness or ataxia or problems with walking or coordination,  change in mood or  memory.        Current Meds  Medication Sig  . acetaminophen (TYLENOL) 500 MG tablet Per bottle as needed  . albuterol (VENTOLIN HFA) 108 (90 Base) MCG/ACT inhaler Inhale 2 puffs into the lungs every 6 (six) hours as needed for wheezing or shortness of breath.  . anastrozole (ARIMIDEX) 1 MG tablet Take 1 tablet (1 mg total) by mouth daily.  Marland Kitchen aspirin EC 81 MG tablet Take 81 mg by mouth daily.  Marland Kitchen atorvastatin (LIPITOR) 20 MG tablet Take 1 tablet (20 mg total) by mouth daily.  . famotidine (PEPCID) 20 MG tablet Take 20 mg by mouth at bedtime.  . fluticasone (FLONASE) 50 MCG/ACT nasal spray Place 2 sprays into both nostrils daily as needed for allergies.  Marland Kitchen gabapentin (NEURONTIN) 300 MG capsule Take 2 capsules (600 mg total) by mouth at bedtime. 2 at bedtime  . Guaifenesin (MUCINEX MAXIMUM STRENGTH) 1200 MG TB12 Take 1,200 mg by mouth 2 (two) times daily as needed.  . hydroxychloroquine (PLAQUENIL) 200 MG tablet Take 1 tablet (200 mg total) by mouth daily.  . metoprolol tartrate (LOPRESSOR) 25 MG tablet Take 1 tablet (25 mg total) by mouth 2 (two) times daily.  . Multiple Vitamin (MULTIVITAMIN) capsule Take 1 capsule by mouth daily.  Marland Kitchen omeprazole (PRILOSEC) 20 MG capsule TAKE 1 CAPSULE BY MOUTH ONCE DAILY  . ondansetron (ZOFRAN) 4 MG tablet Take 1 tablet (4 mg total) by mouth every 8 (eight) hours as needed for nausea or vomiting.  . OXYGEN 2lpm with rest and 3-4 lpm with exertion  . predniSONE (DELTASONE) 5 MG tablet TAKE 4 TABLETS BY MOUTH ONCE DAILY UNTIL BETTER THEN TAKE 2 TABLETS BY MOUTH DAILY FOR 5  DAYS THEN TAKE 1 TAB DAILY (Patient taking differently: 5 mg daily)  . sertraline (ZOLOFT) 50 MG tablet Take 1 tablet (50 mg total) by mouth daily.  . Tiotropium Bromide-Olodaterol (STIOLTO RESPIMAT) 2.5-2.5 MCG/ACT AERS Inhale 2 puffs into the lungs daily.  Marland Kitchen triamterene-hydrochlorothiazide (MAXZIDE-25) 37.5-25 MG tablet TAKE 1 TABLET BY MOUTH ONCE DAILY  . [DISCONTINUED] albuterol (VENTOLIN HFA) 108 (90 Base) MCG/ACT inhaler Inhale 2 puffs into the lungs every 6 (six) hours as needed for wheezing or shortness of breath.        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 Unice Cobble  -h/o Daily prednisone since 2007   > off completely Feb 2012 with no problem> restarted  09/2016  ? 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 Some better but no worse off 04/2011 - Rehab started mid aug 2018  BACK PAIN, LOW (ICD-724.2)  ANEMIA, IRON DEFICIENCY, UNSPEC. (ICD-280.9)  Health Maintenance.......................................................   [facility]   Family History:  crohn`s in daughter, M-lupus, htn, asthma,  no Ca, DM, CAD    Social History:  lives with twin children and grandson. single; >20 pack year history, quit in 2005; no EtOH  Laid off  from works for [employer] Sept 2013           Objective:   Physical Exam wt 166 Oct 12, 2009 >170 October 28, 2010 > 169 06/29/2011 >>171 08/24/2011 > 01/15/2012  171 > 04/24/2012 176 > 06/28/2012  175>  08/13/2012 > 08/30/2012  173 > 170 11/28/2012 > 01/29/2013  176 > 174 >173 06/16/2013 > 171 09/18/2013 > 12/19/2013 177 >179 04/13/2014>  04/23/2014 177 > 08/21/2014 184 >182 11/27/2014 > 05/12/2015 177  > 05/11/2016   177 > 10/27/2016  175 > 11/09/2016 182 > 01/09/2017 198 > 02/20/2017  206 >  07/17/2017   209 >  09/03/2017   210 > 09/28/2017  206  > 11/26/2017   209 > 01/10/2018  210> 02/19/2018  206> 05/21/2018  198   Vital signs reviewed - Note on arrival 02 sats  97% on 2lpm      edentulous    coarsened bs bilaterally with minimal rhonchi better with plm    HEENT: nl   turbinates bilaterally, and oropharynx. Nl external ear canals without cough reflex - edentulous   NECK :  without JVD/Nodes/TM/ nl carotid upstrokes bilaterally   LUNGS: no acc muscle use,  Nl contour chest with coarsened bs bilaterally , minimal exp rhonchi better with plm    CV:  RRR  no s3 or murmur or increase in P2, and no edema   ABD:  soft and nontender with nl inspiratory excursion in the supine position. No bruits or organomegaly appreciated, bowel sounds nl  MS:  Nl gait/ ext warm without deformities, calf tenderness, cyanosis or clubbing No obvious joint restrictions   SKIN: warm and dry with  slt hyperpigmented plaque like changes under nose and ext surface of knees   NEURO:  alert, approp, nl sensorium with  no motor or cerebellar deficits apparent.                          . Assessment:

## 2018-05-21 NOTE — Patient Instructions (Signed)
Please see patient coordinator before you leave today  to schedule referral to dermatology   No change in medications except use prednisone 5 mg  X 2 when breathing/ coughing worse then 1 daily thereafter   Follow the medication calendar we reviewed today   Please schedule a follow up visit in 3 months but call sooner if needed

## 2018-05-22 ENCOUNTER — Encounter: Payer: Self-pay | Admitting: Internal Medicine

## 2018-05-22 ENCOUNTER — Other Ambulatory Visit: Payer: Self-pay

## 2018-05-22 MED ORDER — SERTRALINE HCL 50 MG PO TABS: 50.0000 mg | ORAL_TABLET | Freq: Every day | ORAL | 3 refills | Status: DC

## 2018-05-22 NOTE — Assessment & Plan Note (Signed)
Positive transbronchial biopsy 02/19/91 by Dr. William Hopper  -Daily prednisone since 2007 with ? adrenal insufficiency iatrogenic > off completely Feb 2012 with no recurrence - PFT's 06/26/08 FEV1  1.09 ratio  - PFTs  03/02/10 FEV1  1.06 (42%) ratio 34 and no better p B2,  DLC0 48% corrects to 64% - PFT's 08/01/2011 FEV1 1.6 (42%)  41 ratio  DLCO 55 corrects 77 - PFTs 10/10/16    FEV1  0.87(38)ratio 41 and DLCO 36/39c corrects to 51 for alv vol > placed back on daily pred  - 02/20/2017 decreased pred to 5 mg daily > to 2.5mg daily as of 07/17/2017  - 07/17/2017 try 2.5 mg qod > 09/28/2017 changed to 20 mg until better and new floor of 5 mg daily plus consider plaquenil due to new skin involvement on face  - opth eval neg 11/10/17 - Start plaquenil 200 mg daily 11/26/2017  - 01/10/2018 ESR up to 97 with pred taper so rec bid plaquenil > could only tol 200 mg daily  - 02/19/2018 ESR = 94 on 5mg daily   - 05/21/2018 referred to dermatology   The goal with a chronic steroid dependent illness is always arriving at the lowest effective dose that controls the disease/symptoms and not accepting a set "formula" which is based on statistics or guidelines that don't always take into account patient  variability or the natural hx of the dz in every individual patient, which may well vary over time.  For now therefore I recommend the patient maintain  10 mg until better then 5 mg daily as per med cal instructions  ? Try plaquenil again at 200 bid instead of 200 mg daily if can't get back to 5 mg dosing pending derm eval.      

## 2018-05-22 NOTE — Assessment & Plan Note (Signed)
New start 09/2016 at admit see records - 10/18/16 Patient Saturations on Room Air at Rest = 92% Patient Saturations on Room Air while Ambulating = 84% Patient Saturations on 2 Liters of oxygen while Ambulating = 90% - HCO3  11/09/2016 = 39  - HCO3   03/16/17        = 34  - HC03    01/10/2018  = 40    Adequate control on present rx, reviewed in detail with pt > no change in rx needed   As of  05/21/2018   rec 2lpm sleeping and titrate with ambulation to maintain > 90%

## 2018-05-24 DIAGNOSIS — J9611 Chronic respiratory failure with hypoxia: Secondary | ICD-10-CM | POA: Diagnosis not present

## 2018-05-24 DIAGNOSIS — J449 Chronic obstructive pulmonary disease, unspecified: Secondary | ICD-10-CM | POA: Diagnosis not present

## 2018-06-07 ENCOUNTER — Other Ambulatory Visit: Payer: Self-pay

## 2018-06-07 MED ORDER — METOPROLOL TARTRATE 25 MG PO TABS: 25.0000 mg | ORAL_TABLET | Freq: Two times a day (BID) | ORAL | 11 refills | Status: DC

## 2018-06-11 ENCOUNTER — Ambulatory Visit (INDEPENDENT_AMBULATORY_CARE_PROVIDER_SITE_OTHER): Payer: BLUE CROSS/BLUE SHIELD | Admitting: Family Medicine

## 2018-06-11 ENCOUNTER — Other Ambulatory Visit: Payer: Self-pay

## 2018-06-11 VITALS — BP 112/62 | HR 90 | Temp 99.3°F | Ht 65.0 in | Wt 199.2 lb

## 2018-06-11 DIAGNOSIS — J069 Acute upper respiratory infection, unspecified: Secondary | ICD-10-CM | POA: Diagnosis not present

## 2018-06-11 MED ORDER — AZITHROMYCIN 250 MG PO TABS: ORAL_TABLET | ORAL | 0 refills | Status: DC

## 2018-06-11 NOTE — Progress Notes (Signed)
     Subjective:   Patient ID: [name]    DOB: 16-Nov-1960, 57 y.o. female   MRN: [id number]  CC: sore throat, cough  HPI: [name] is a 57 y.o. female who presents to clinic today for the following issue.  Cough Patient describes her symptoms began on Friday with a sore throat which then progressed to a productive cough with thick green sputum. Did not check temp at home, but feels like she had a fever.  No chills.  She endorses a runny nose and congestion.  No rash noted.  No sick contacts or recent travel.  No abdominal pain, diarrhea, chest pain, shortness of breath.  She has had somewhat of a decreased appetite but is able to tolerate fluids and has been drinking water and soups.  She endorses nausea with one episode of vomiting after cough.  ROS: See HPI for pertinent ROS.  Social: Patient is a former smoker, quit 2005 Medications reviewed. Objective:   BP 112/62   Pulse 90   Temp 99.3 F (37.4 C) (Oral)   Ht 5\' 5"  (1.651 m)   Wt 199 lb 3.2 oz (90.4 kg)   SpO2 90% Comment: @2L   BMI 33.15 kg/m  Vitals and nursing note reviewed.  General: 57 year old female, NAD HEENT: NCAT, EOMI, PERRL, MMM, clear rhinorrhea present, oropharynx clear without tonsillar exudate or erythema Neck: supple, non-tender, normal ROM, no LAD  CV: RRR no MRG  Lungs: CTAB, normal effort  Abdomen: soft, NTND, no masses or organomegaly, +bs  Skin: warm, dry, no rash Extremities: warm and well perfused, normal tone Neuro: alert, oriented x3, no focal deficits   Assessment & Plan:   Upper respiratory tract infection Discussed with patient that this is likely viral however given her symptoms and severity, will treat with a course of antibiotics.  No red flags on exam.  Lung exam is unremarkable, she otherwise appears comfortable. -Recommend warm fluids and p.o. intake -Rx: Azithromycin, instructed patient to complete course as prescribed even if she begins to feel better -Tylenol as  needed -Return precautions discussed  Meds ordered this encounter  Medications  . azithromycin (ZITHROMAX) 250 MG tablet    Sig: Take 2 tablets with meals today, followed by 1 tab daily.    Dispense:  6 each    Refill:  0    Lovenia Kim, MD [place], PGY-3 06/18/2018 9:40 AM

## 2018-06-11 NOTE — Patient Instructions (Signed)
It was nice meeting you today.  You were seen in clinic for sore throat and a cough which is most likely due to a upper respiratory infection.  Given your severity of symptoms, I am prescribing you a course of antibiotics called azithromycin -take 2 tablets today followed by 1 tablet daily. You can take Tylenol as needed for pain and fever.  I would recommend drinking warm fluids as this will also help you to feel better faster.  If you have any new or worsening symptoms, I would like you to make an appointment to be seen by a provider.  Call clinic if you have any questions.  Be well, Lovenia Kim MD

## 2018-06-20 ENCOUNTER — Other Ambulatory Visit: Payer: Self-pay | Admitting: General Surgery

## 2018-06-20 DIAGNOSIS — Z9889 Other specified postprocedural states: Secondary | ICD-10-CM

## 2018-06-24 DIAGNOSIS — J9611 Chronic respiratory failure with hypoxia: Secondary | ICD-10-CM | POA: Diagnosis not present

## 2018-06-24 DIAGNOSIS — J449 Chronic obstructive pulmonary disease, unspecified: Secondary | ICD-10-CM | POA: Diagnosis not present

## 2018-07-13 ENCOUNTER — Other Ambulatory Visit: Payer: Self-pay | Admitting: Internal Medicine

## 2018-07-25 DIAGNOSIS — J9611 Chronic respiratory failure with hypoxia: Secondary | ICD-10-CM | POA: Diagnosis not present

## 2018-07-25 DIAGNOSIS — J449 Chronic obstructive pulmonary disease, unspecified: Secondary | ICD-10-CM | POA: Diagnosis not present

## 2018-08-05 ENCOUNTER — Other Ambulatory Visit: Payer: Self-pay

## 2018-08-05 ENCOUNTER — Telehealth: Payer: Self-pay | Admitting: Internal Medicine

## 2018-08-05 NOTE — Telephone Encounter (Signed)
Spoke with patient. She stated that she has an appt with MW on 10/9 but does not want to wait until then to see him. She has been experiencing some head and chest congestion for the past few days. She has been using Flonase and Mucinex with no help. When she is able to cough, her phlegm is green.   She has been scheduled as an acute visit with MW for tomorrow at 8. Patient could not come in today due to transportation issues. She is aware of appointment. Nothing further needed at time of call.

## 2018-08-06 ENCOUNTER — Ambulatory Visit (INDEPENDENT_AMBULATORY_CARE_PROVIDER_SITE_OTHER): Payer: BLUE CROSS/BLUE SHIELD | Admitting: Internal Medicine

## 2018-08-06 ENCOUNTER — Encounter: Payer: Self-pay | Admitting: Internal Medicine

## 2018-08-06 VITALS — BP 120/74 | HR 84 | Temp 98.8°F | Ht 65.0 in | Wt 197.8 lb

## 2018-08-06 DIAGNOSIS — D869 Sarcoidosis, unspecified: Secondary | ICD-10-CM | POA: Diagnosis not present

## 2018-08-06 DIAGNOSIS — J069 Acute upper respiratory infection, unspecified: Secondary | ICD-10-CM

## 2018-08-06 DIAGNOSIS — J449 Chronic obstructive pulmonary disease, unspecified: Secondary | ICD-10-CM

## 2018-08-06 DIAGNOSIS — J9611 Chronic respiratory failure with hypoxia: Secondary | ICD-10-CM | POA: Diagnosis not present

## 2018-08-06 DIAGNOSIS — J9612 Chronic respiratory failure with hypercapnia: Secondary | ICD-10-CM

## 2018-08-06 MED ORDER — FLUTICASONE PROPIONATE 50 MCG/ACT NA SUSP: 2.0000 | Freq: Every day | NASAL | 11 refills | Status: DC | PRN

## 2018-08-06 MED ORDER — AMOXICILLIN-POT CLAVULANATE 875-125 MG PO TABS: 1.0000 | ORAL_TABLET | Freq: Two times a day (BID) | ORAL | 0 refills | Status: AC

## 2018-08-06 NOTE — Assessment & Plan Note (Signed)
Acute flare x 5 days assoc with ? Sinusitis R maxillary giving her sense of ear pain on the R with purulent sputum and severe cough > rec  augmentin bid x 10d days/ mucinex dm up to 1200 mg bid and advised on approp use of afrin with nasal steroids

## 2018-08-06 NOTE — Assessment & Plan Note (Signed)
Quit smoking 2005  - PFT's 06/26/08 FEV1 1.09 ratio  - PFTs 03/02/10 FEV1 1.06 ratio 34    PFT's 08/01/2011 FEV1 1.6 (42%)  41 ratio  DLCO 55 corrects 77 - Spiriva trial March 02, 2010 > ? better but not worse off it 04/2011 - 05/12/2015 p extensive coaching HFA effectiveness =    90%  - Referred to Rehab 08/21/2014 > could not arrange due to schedule  -med calendar 06/16/2013 > did not bring to office as requested 12/19/13 or 04/23/14  Or 08/21/2014  - 11/09/2016    changed symb to bevespi since taking chronic pred for pf    - 02/20/2017 decreased pred to 5 mg daily > weaned to 2.5 mg qd as of 07/17/2017  - started rehab mid Aug 2018  - 07/17/2017 changed to pred 2.5 even days > did not tolerate so changed  Around  07/28/17 back to 5 mg qod  - 09/03/2017  After extensive coaching HFA effectiveness =    90% so try stiolto  - Spirometry 09/03/2017  FEV1 0.52 (23%)  Ratio 40 with typical curvature   - PFT's  02/19/2018  FEV1 0.65 (29 % ) ratio 43  p 1 % improvement from saba p stiolto/ pred 5 mg  prior to study with DLCO  39 % corrects to 56  % for alv volume     On pred for sarcoid anyway is now typical of GOLD B pt so lama/ laba appropriate here>continue stiolto and prn saba     I had an extended discussion with the patient reviewing all relevant studies completed to date and  lasting 15 to 20 minutes of a 25 minute visit    Each maintenance medication was reviewed in detail including most importantly the difference between maintenance and prns and under what circumstances the prns are to be triggered using an action plan format that is not reflected in the computer generated alphabetically organized AVS but trather by a customized med calendar that reflects the AVS meds with confirmed 100% correlation.   In addition, Please see AVS for unique instructions that I personally wrote and verbalized to the the pt in detail and then reviewed with pt  by my nurse highlighting any  changes in therapy recommended at  today's visit to their plan of care.

## 2018-08-06 NOTE — Assessment & Plan Note (Signed)
Positive transbronchial biopsy 02/19/91 by Dr. Unice Cobble  -Daily prednisone since 2007 with ? adrenal insufficiency iatrogenic > off completely Feb 2012 with no recurrence - PFT's 06/26/08 FEV1  1.09 ratio  - PFTs  03/02/10 FEV1  1.06 (42%) ratio 34 and no better p B2,  DLC0 48% corrects to 64% - PFT's 08/01/2011 FEV1 1.6 (42%)  41 ratio  DLCO 55 corrects 77 - PFTs 10/10/16    FEV1  0.87(38)ratio 41 and DLCO 36/39c corrects to 51 for alv vol > placed back on daily pred  - 02/20/2017 decreased pred to 5 mg daily > to 2.30m daily as of 07/17/2017  - 07/17/2017 try 2.5 mg qod > 09/28/2017 changed to 20 mg until better and new floor of 5 mg daily plus consider plaquenil due to new skin involvement on face  - opth eval neg 11/10/17 - Start plaquenil 200 mg daily 11/26/2017  - 01/10/2018 ESR up to 97 with pred taper so rec bid plaquenil > could only tol 200 mg daily  - 02/19/2018 ESR = 94 on 574mdaily  - 05/21/2018 referred to dermatology      Since chronically steroid dep rec double the dose of prednisone until better then one daily

## 2018-08-06 NOTE — Progress Notes (Signed)
Subjective:    Patient ID: [name], female   DOB: 06-28-1961    MRN: [id number]    Brief patient profile:  74 yobf quit smoking 10/2004 dx by transbronchial biopsy 5/92 with NCG inflammatory change consistent with sarcoid. Since that time she's been off and on prednisone multiple  times with flares of coughing dyspnea and skin involvement > weaned off chronic prednisone Feb 2012 and placed back on 10/10/16 for deteriorating obstructive changes on pfts  With resp failure/ 02 dep        History of Present Illness  02/20/2017  f/u ov/[name] re:   GOLD III/ pred 10 mg daily and bevespi 2bid  and alb rare  Chief Complaint  Patient presents with  . Follow-up    6wk rov. pt states breathing is baseline. pt reports of sob with exertion & occ prod cough with green mucus mainly in the morning   Doe better :  [facility] = can't walk a nl pace on a flat grade s sob but does fine slow and flat eg shopping on  2lpm / ok at rest s 02  Uses 02 2lpm sleeping  rec Drop prednisone to 10 mg one half daily      NP eval 05/14/17  We will decrease your prednisone to 2.5 mg daily. We will prescribe 5 mg tablets. We will renew your prescription for Symbicort.  2 puffs twice daily. Rinse mouth after use. Follow up appointment with Dr. Melvyn Novas in 2 months. We will refer you to Pulmonary rehab > started Aug 2018  Referral to a dietician for weight loss >  at rehab     Date of Admission: 09/07/2017                    Date of Discharge: 09/09/17    Discharge Diagnoses/Problem List:  COPD GOLD stage IV HTN Anemia HLD Breast cancer s/p resection Sarcoidosis GERD     09/28/2017  f/u ov/[name] re: post hosp f/u  02 2lpm 24/7  Chief Complaint  Patient presents with  . Follow-up    Pt states had to go to ED 09/07/17 with CP- resolved after nitro. She c/o increased cough with thick, green sputum. She has some bumps under her nose and on both knees- itchy. She has had some blurred vision and redness- seen  PCP and dxed with pink eye- no better after txed with eye gtts they gave her.   on omeprazole 20 before bfast and Pepcid 20 mg at time of admit with dx "gerd induced cp" On the 2.5 qod dosing for sarcoid has had progressive decline and now on 02 24/7/ new skin changes typical for sarcoid under nose and on knees plus am cough x sev weeks green mucus assoc with nasal congestion   rec With cough/ shortness/chest pain > omperzole 40 mg Take 30- 60 min before your first and last meals of the day and pepcid 20 mg at bedtime  Augmentin 875 mg take one pill twice daily  X 10 days - Prednisone 20 mg daily until better, then 10 mg daily x 5 days, then 60m daily     11/26/2017  f/u ov/[name] re:  Sarcoid/skin involvement chronic cough ? Sinus dz/ using med calendar well / pednisone 10 mg daily  Chief Complaint  Patient presents with  . Follow-up    Cough has improved some, but still producing some greenish yellow sputum first thing in the am.  She has not had to use her  albuterol inhaler. She c/o shakiness in her hands for the past couple of months.    less cough p augmentin but still am sputum green / no noct symptoms Doing well with rehab but sometimes has to turn 02 up to 4 lpm there but doesn't do so in other settings and concerned about wt gain  rec Start plaquenil 200 mg daily  Ok to adjust the prednisone 24m  from 1-2 pills in am as per med calendar See calendar for specific medication instructions    01/10/2018  f/u ov/[name] re:  Sarcoid/ plaquenil 200 mg daily plus pred 5 mg daily / last time off was one year prior/ 02 = 2lpm floor and 4lpm with ex Chief Complaint  Patient presents with  . Follow-up    repeat LFT's today. She states she has noticed some shaking in her hands for a while now and forgot to mention this at last ov. Her breathing is doing well and she is down to 5 mg pred.    Dyspnea:  Ex bike x 15-30 min / low resistance and 02 up to 3-4lpm sats in mid 90s Cough: no Sleep: on  2lpm / one pillow  SABA use:  Not needing  rec Try prednisone 5 mg  Reduce to one half daily to see if not doing as well and if not increase prednisone to 5 mg and call me for higher dose of plaquenil  Late add: with esr high and lfts ok rec bid plaquenil and return in one month for pfts/lfts> could not tol 400 mg daily due to n and v but ok on 200 mg daily      05/21/2018  f/u ov/[name] re:  codp GOLD IV/ 02 dep on pred 5 mg x 2  daily for sarcoid / intol of plaquenil > 200 mg (N/V) Using med cal well  Chief Complaint  Patient presents with  . Follow-up    Breathing unchanged since the last visit. She has prod cough in the am's with min green sputum. She is using her albuterol inhaler 1 x per wk on average.   Dyspnea:  On 3-4 lpm does [employer] / also able to do ex bike x 15 min  Cough: just in am , slt discolored mucus  SABA use: rare, usually if goes out in hear  02: 2lpm hs and 3-4 lpm daytime   rec Please see patient coordinator before you leave today  to schedule referral to dermatology  No change in medications except use prednisone 5 mg  X 2 when breathing/ coughing worse then 1 daily thereafter Follow the medication calendar we reviewed today     08/06/2018 acute extended ov/[name] re: copd/ Gold IV/ 02 dep  And pred 5 mg daily for sarcoid  Chief Complaint  Patient presents with  . Acute Visit    Pt c/o cough, runny nose and congestion x 5 days. Cough is prod with green sputum. She also c/o right ear pain.    02  2lpm and up 4 with activity  Acute onset 08/02/18 with ST, nasal congestion with greens mucus / R ear ache but no sob over baseline   No obvious day to day or daytime variability or assoc mucus plugs or hemoptysis or cp or chest tightness, subjective wheeze or overt sinus or hb symptoms.     Also denies any obvious fluctuation of symptoms with weather or environmental changes or other aggravating or alleviating factors except as outlined above   No unusual exposure  hx  or h/o childhood pna/ asthma or knowledge of premature birth.  Current Allergies, Complete Past Medical History, Past Surgical History, Family History, and Social History were reviewed in [employer] record.  ROS  The following are not active complaints unless bolded Hoarseness, sore throat, dysphagia, dental problems, itching, sneezing,  nasal congestion or discharge of excess mucus or purulent secretions, ear ache,   fever, chills, sweats, unintended wt loss or wt gain, classically pleuritic or exertional cp,  orthopnea pnd or arm/hand swelling  or leg swelling, presyncope, palpitations, abdominal pain, anorexia, nausea, vomiting, diarrhea  or change in bowel habits or change in bladder habits, change in stools or change in urine, dysuria, hematuria,  rash, arthralgias, visual complaints, headache, numbness, weakness or ataxia or problems with walking or coordination,  change in mood or  memory.        Current Meds  Medication Sig  . acetaminophen (TYLENOL) 500 MG tablet Per bottle as needed  . albuterol (VENTOLIN HFA) 108 (90 Base) MCG/ACT inhaler Inhale 2 puffs into the lungs every 6 (six) hours as needed for wheezing or shortness of breath.  . anastrozole (ARIMIDEX) 1 MG tablet Take 1 tablet (1 mg total) by mouth daily.  Marland Kitchen aspirin EC 81 MG tablet Take 81 mg by mouth daily.  Marland Kitchen atorvastatin (LIPITOR) 20 MG tablet Take 1 tablet (20 mg total) by mouth daily.  . famotidine (PEPCID) 20 MG tablet Take 20 mg by mouth at bedtime.  . fluticasone (FLONASE) 50 MCG/ACT nasal spray Place 2 sprays into both nostrils daily as needed for allergies.  Marland Kitchen gabapentin (NEURONTIN) 300 MG capsule Take 2 capsules (600 mg total) by mouth at bedtime. 2 at bedtime  . Guaifenesin (MUCINEX MAXIMUM STRENGTH) 1200 MG TB12 Take 1,200 mg by mouth 2 (two) times daily as needed.  . hydroxychloroquine (PLAQUENIL) 200 MG tablet Take 1 tablet (200 mg total) by mouth daily.  . metoprolol tartrate (LOPRESSOR)  25 MG tablet Take 1 tablet (25 mg total) by mouth 2 (two) times daily.  . Multiple Vitamin (MULTIVITAMIN) capsule Take 1 capsule by mouth daily.  Marland Kitchen omeprazole (PRILOSEC) 20 MG capsule TAKE 1 CAPSULE BY MOUTH ONCE DAILY  . ondansetron (ZOFRAN) 4 MG tablet Take 1 tablet (4 mg total) by mouth every 8 (eight) hours as needed for nausea or vomiting.  . OXYGEN 2lpm with rest and 3-4 lpm with exertion  . predniSONE (DELTASONE) 5 MG tablet Take 1 tablet (5 mg total) by mouth daily with breakfast. As directed  . sertraline (ZOLOFT) 50 MG tablet Take 1 tablet (50 mg total) by mouth daily.  . Tiotropium Bromide-Olodaterol (STIOLTO RESPIMAT) 2.5-2.5 MCG/ACT AERS Inhale 2 puffs into the lungs daily.  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 Unice Cobble  -h/o Daily prednisone since 2007   > off completely Feb 2012 with no problem> restarted  09/2016  ? of SUPERFICIAL VEIN THROMBOSIS (ICD-453.9)   ENDOMETRIAL POLYP (ICD-621.0)   UTERINE FIBROID (ICD-218.9)  RHINITIS, ALLERGIC (ICD-477.9)  HYPERTENSION, BENIGN SYSTEMIC (ICD-401.1)  COPD (ICD-496)  - PFT's 06/26/08 FEV1 1.09 ratio  - PFTs 03/02/10 FEV1 1.06 ratio 34  - PFT's 08/01/2011 FEV1 1.6 (42%)  41 ratio  DLCO 55 corrects 77 - HFA  50% 06/05/2011  >  75% 06/29/2011  - Spiriva trial March 02, 2010 > ? Some better but no worse off 04/2011 - Rehab started mid aug 2018  BACK PAIN, LOW (ICD-724.2)  ANEMIA, IRON DEFICIENCY, UNSPEC. (ICD-280.9)  Health Maintenance.......................................................   [facility]   Family History:  crohn`s in daughter, M-lupus, htn, asthma,  no Ca, DM, CAD    Social History:   lives with twin children and grandson. single;  >20 pack year history, quit in 2005; no EtOH  Laid off  from works for [employer] Sept 2013           Objective:   Physical Exam wt 166 Oct 12, 2009 >170 October 28, 2010 > 169 06/29/2011 >>171 08/24/2011 > 01/15/2012  171 > 04/24/2012 176 > 06/28/2012  175> 08/13/2012 > 08/30/2012  173 > 170 11/28/2012 > 01/29/2013  176 > 174 >173 06/16/2013 > 171 09/18/2013 > 12/19/2013 177 >179 04/13/2014>  04/23/2014 177 > 08/21/2014 184 >182 11/27/2014 > 05/12/2015 177  > 05/11/2016   177 > 10/27/2016  175 > 11/09/2016 182 > 01/09/2017 198 > 02/20/2017  206 >  07/17/2017   209 >  09/03/2017   210 > 09/28/2017  206  > 11/26/2017   209 > 01/10/2018  210> 02/19/2018  206> 05/21/2018  198 > 08/06/2018  197   amb bf very congested sounding cough     Vital signs reviewed - Note on arrival 02 sats  97% on 2lpm pulsed       HEENT: nl   oropharynx. Nl external ear canals without cough reflex - moderate bilateral non-specific turbinate edema  / edentulous   NECK :  without JVD/Nodes/TM/ nl carotid upstrokes bilaterally   LUNGS: no acc muscle use,  Nl contour chest which is clear to A and P bilaterally without cough on insp or exp maneuvers   CV:  RRR  no s3 or murmur or increase in P2, and no edema   ABD:  soft and nontender with nl inspiratory excursion in the supine position. No bruits or organomegaly appreciated, bowel sounds nl  MS:  Nl gait/ ext warm without deformities, calf tenderness, cyanosis or clubbing No obvious joint restrictions   SKIN: warm and dry with minimal scattered hyperpigmented plaques  NEURO:  alert, approp, nl sensorium with  no motor or cerebellar deficits apparent.         . Assessment:

## 2018-08-06 NOTE — Assessment & Plan Note (Signed)
New start 09/2016 at admit see records - 10/18/16 Patient Saturations on Room Air at Rest = 92% Patient Saturations on Room Air while Ambulating = 84% Patient Saturations on 2 Liters of oxygen while Ambulating = 90% - HCO3  11/09/2016 = 39  - HCO3   03/16/17        = 34  - HC03    01/10/2018  = 40   As of  08/06/2018  rec 2lpm sleeping and titrate with ambulation to maintain > 90%   Despite flare of uri > Adequate control on present rx, reviewed in detail with pt > no change in rx needed

## 2018-08-06 NOTE — Assessment & Plan Note (Addendum)
Quit smoking 2005  - PFT's 06/26/08 FEV1 1.09 ratio  - PFTs 03/02/10 FEV1 1.06 ratio 34    PFT's 08/01/2011 FEV1 1.6 (42%)  41 ratio  DLCO 55 corrects 77 - Spiriva trial March 02, 2010 > ? better but not worse off it 04/2011 - 05/12/2015 p extensive coaching HFA effectiveness =    90%  - Referred to Rehab 08/21/2014 > could not arrange due to schedule  -med calendar 06/16/2013 > did not bring to office as requested 12/19/13 or 04/23/14  Or 08/21/2014  - 11/09/2016    changed symb to bevespi since taking chronic pred for pf    - 02/20/2017 decreased pred to 5 mg daily > weaned to 2.5 mg qd as of 07/17/2017  - started rehab mid Aug 2018  - 07/17/2017 changed to pred 2.5 even days > did not tolerate so changed  Around  07/28/17 back to 5 mg qod  - 09/03/2017  After extensive coaching HFA effectiveness =    90% so try stiolto  - Spirometry 09/03/2017  FEV1 0.52 (23%)  Ratio 40 with typical curvature   - PFT's  02/19/2018  FEV1 0.65 (29 % ) ratio 43  p 1 % improvement from saba p stiolto/ pred 5 mg  prior to study with DLCO  39 % corrects to 56  % for alv volume     No evidence of copd flare, continue same rx with prns reviewed per med calendar    I had an extended discussion with the patient reviewing all relevant studies completed to date and  lasting 15 to 20 minutes of a 25 minute acute office visit    Each maintenance medication was reviewed in detail including most importantly the difference between maintenance and prns and under what circumstances the prns are to be triggered using an action plan format that is not reflected in the computer generated alphabetically organized AVS but trather by a customized med calendar that reflects the AVS meds with confirmed 100% correlation.   In addition, Please see AVS for unique instructions that I personally wrote and verbalized to the the pt in detail and then reviewed with pt  by my nurse highlighting any  changes in therapy recommended at today's visit to their plan  of care.

## 2018-08-06 NOTE — Patient Instructions (Addendum)
Prednisone should be 10 mg daily until better then resume the 5 mg daily per med calendar   Augmentin 875 mg take one pill twice daily  X 10 days - take at breakfast and supper with large glass of water.  It would help reduce the usual side effects (diarrhea and yeast infections) if you ate cultured yogurt at lunch.   For cough/ congestion > mucinex dm 1200 mg every 12 hours as needed   Remember afrin prior to flonase x maximum of 5 days    Keep prior appt  - call in meantime if tending

## 2018-08-15 ENCOUNTER — Ambulatory Visit
Admission: RE | Admit: 2018-08-15 | Discharge: 2018-08-15 | Disposition: A | Discharge status: Home / Self Care | Payer: BLUE CROSS/BLUE SHIELD | Source: Ambulatory Visit | Attending: General Surgery | Admitting: General Surgery

## 2018-08-15 DIAGNOSIS — Z9889 Other specified postprocedural states: Secondary | ICD-10-CM

## 2018-08-15 DIAGNOSIS — R928 Other abnormal and inconclusive findings on diagnostic imaging of breast: Secondary | ICD-10-CM | POA: Diagnosis not present

## 2018-08-15 DIAGNOSIS — Z853 Personal history of malignant neoplasm of breast: Secondary | ICD-10-CM | POA: Diagnosis not present

## 2018-08-17 ENCOUNTER — Other Ambulatory Visit: Payer: Self-pay | Admitting: Internal Medicine

## 2018-08-21 ENCOUNTER — Encounter: Payer: Self-pay | Admitting: Internal Medicine

## 2018-08-21 ENCOUNTER — Ambulatory Visit (INDEPENDENT_AMBULATORY_CARE_PROVIDER_SITE_OTHER): Payer: BLUE CROSS/BLUE SHIELD | Admitting: Internal Medicine

## 2018-08-21 ENCOUNTER — Other Ambulatory Visit (INDEPENDENT_AMBULATORY_CARE_PROVIDER_SITE_OTHER): Payer: BLUE CROSS/BLUE SHIELD

## 2018-08-21 VITALS — BP 118/72 | HR 80 | Temp 98.0°F | Ht 65.0 in | Wt 196.0 lb

## 2018-08-21 DIAGNOSIS — J9612 Chronic respiratory failure with hypercapnia: Secondary | ICD-10-CM

## 2018-08-21 DIAGNOSIS — D869 Sarcoidosis, unspecified: Secondary | ICD-10-CM

## 2018-08-21 DIAGNOSIS — J449 Chronic obstructive pulmonary disease, unspecified: Secondary | ICD-10-CM | POA: Diagnosis not present

## 2018-08-21 DIAGNOSIS — J9611 Chronic respiratory failure with hypoxia: Secondary | ICD-10-CM | POA: Diagnosis not present

## 2018-08-21 DIAGNOSIS — Z23 Encounter for immunization: Secondary | ICD-10-CM

## 2018-08-21 LAB — HEPATIC FUNCTION PANEL
ALBUMIN: 3.7 g/dL (ref 3.5–5.2)
ALT: 11 U/L (ref 0–35)
AST: 14 U/L (ref 0–37)
Alkaline Phosphatase: 67 U/L (ref 39–117)
BILIRUBIN DIRECT: 0.1 mg/dL (ref 0.0–0.3)
TOTAL PROTEIN: 8.1 g/dL (ref 6.0–8.3)
Total Bilirubin: 0.3 mg/dL (ref 0.2–1.2)

## 2018-08-21 LAB — SEDIMENTATION RATE: Sed Rate: 80 mm/hr — ABNORMAL HIGH (ref 0–30)

## 2018-08-21 NOTE — Patient Instructions (Signed)
No change recs   Please remember to go to the lab department downstairs in the basement  for your tests - we will call you with the results when they are available.      See calendar for specific medication instructions and bring it back for each and every office visit for every healthcare provider you see.  Without it,  you may not receive the best quality medical care that we feel you deserve.  You will note that the calendar groups together  your maintenance  medications that are timed at particular times of the day.  Think of this as your checklist for what your doctor has instructed you to do until your next evaluation to see what benefit  there is  to staying on a consistent group of medications intended to keep you well.  The other group at the bottom is entirely up to you to use as you see fit  for specific symptoms that may arise between visits that require you to treat them on an as needed basis.  Think of this as your action plan or "what if" list.   Separating the top medications from the bottom group is fundamental to providing you adequate care going forward.     Please schedule a follow up visit in 3 months but call sooner if needed

## 2018-08-21 NOTE — Progress Notes (Signed)
Subjective:    Patient ID: [name], female   DOB: [birth year]    MRN: [id number]    Brief patient profile:  52 yobf quit smoking 10/2004 dx by transbronchial biopsy 5/92 with NCG inflammatory change consistent with sarcoid. Since that time she's been off and on prednisone multiple  times with flares of coughing dyspnea and skin involvement > weaned off chronic prednisone Feb 2012 and placed back on 10/10/16 for deteriorating obstructive changes on pfts  With resp failure/ 02 dep        History of Present Illness  02/20/2017  f/u ov/[name] re:   GOLD III/ pred 10 mg daily and bevespi 2bid  and alb rare  Chief Complaint  Patient presents with  . Follow-up    6wk rov. pt states breathing is baseline. pt reports of sob with exertion & occ prod cough with green mucus mainly in the morning   Doe better :  [facility] = can't walk a nl pace on a flat grade s sob but does fine slow and flat eg shopping on  2lpm / ok at rest s 02  Uses 02 2lpm sleeping  rec Drop prednisone to 10 mg one half daily      NP eval 05/14/17  We will decrease your prednisone to 2.5 mg daily. We will prescribe 5 mg tablets. We will renew your prescription for Symbicort.  2 puffs twice daily. Rinse mouth after use. Follow up appointment with Dr. Melvyn Novas in 2 months. We will refer you to Pulmonary rehab > started Aug 2018  Referral to a dietician for weight loss >  at rehab     Date of Admission: 09/07/2017                    Date of Discharge: 09/09/17    Discharge Diagnoses/Problem List:  COPD GOLD stage IV HTN Anemia HLD Breast cancer s/p resection Sarcoidosis GERD     09/28/2017  f/u ov/[name] re: post hosp f/u  02 2lpm 24/7  Chief Complaint  Patient presents with  . Follow-up    Pt states had to go to ED 09/07/17 with CP- resolved after nitro. She c/o increased cough with thick, green sputum. She has some bumps under her nose and on both knees- itchy. She has had some blurred vision and redness- seen  PCP and dxed with pink eye- no better after txed with eye gtts they gave her.   on omeprazole 20 before bfast and Pepcid 20 mg at time of admit with dx "gerd induced cp" On the 2.5 qod dosing for sarcoid has had progressive decline and now on 02 24/7/ new skin changes typical for sarcoid under nose and on knees plus am cough x sev weeks green mucus assoc with nasal congestion   rec With cough/ shortness/chest pain > omperzole 40 mg Take 30- 60 min before your first and last meals of the day and pepcid 20 mg at bedtime  Augmentin 875 mg take one pill twice daily  X 10 days - Prednisone 20 mg daily until better, then 10 mg daily x 5 days, then 66m daily     11/26/2017  f/u ov/[name] re:  Sarcoid/skin involvement chronic cough ? Sinus dz/ using med calendar well / pednisone 10 mg daily  Chief Complaint  Patient presents with  . Follow-up    Cough has improved some, but still producing some greenish yellow sputum first thing in the am.  She has not had to use her  albuterol inhaler. She c/o shakiness in her hands for the past couple of months.    less cough p augmentin but still am sputum green / no noct symptoms Doing well with rehab but sometimes has to turn 02 up to 4 lpm there but doesn't do so in other settings and concerned about wt gain  rec Start plaquenil 200 mg daily  Ok to adjust the prednisone 55m  from 1-2 pills in am as per med calendar See calendar for specific medication instructions    01/10/2018  f/u ov/[name] re:  Sarcoid/ plaquenil 200 mg daily plus pred 5 mg daily / last time off was one year prior/ 02 = 2lpm floor and 4lpm with ex Chief Complaint  Patient presents with  . Follow-up    repeat LFT's today. She states she has noticed some shaking in her hands for a while now and forgot to mention this at last ov. Her breathing is doing well and she is down to 5 mg pred.    Dyspnea:  Ex bike x 15-30 min / low resistance and 02 up to 3-4lpm sats in mid 90s Cough: no Sleep: on  2lpm / one pillow  SABA use:  Not needing  rec Try prednisone 5 mg  Reduce to one half daily to see if not doing as well and if not increase prednisone to 5 mg and call me for higher dose of plaquenil  Late add: with esr high and lfts ok rec bid plaquenil and return in one month for pfts/lfts> could not tol 400 mg daily due to n and v but ok on 200 mg daily      05/21/2018  f/u ov/[name] re:  codp GOLD IV/ 02 dep on pred 5 mg x 2  daily for sarcoid / intol of plaquenil > 200 mg (N/V) Using med cal well  Chief Complaint  Patient presents with  . Follow-up    Breathing unchanged since the last visit. She has prod cough in the am's with min green sputum. She is using her albuterol inhaler 1 x per wk on average.   Dyspnea:  On 3-4 lpm does [employer] / also able to do ex bike x 15 min  Cough: just in am , slt discolored mucus  SABA use: rare, usually if goes out in hear  02: 2lpm hs and 3-4 lpm daytime   rec Please see patient coordinator before you leave today  to schedule referral to dermatology  No change in medications except use prednisone 5 mg  X 2 when breathing/ coughing worse then 1 daily thereafter Follow the medication calendar we reviewed today     08/06/2018 acute extended ov/[name] re: copd/ Gold IV/ 02 dep  And pred 5 mg daily for sarcoid  Chief Complaint  Patient presents with  . Acute Visit    Pt c/o cough, runny nose and congestion x 5 days. Cough is prod with green sputum. She also c/o right ear pain.    02  2lpm and up 4 with activity  Acute onset 08/02/18 with ST, nasal congestion with greens mucus / R ear ache but no sob over baseline rec Prednisone should be 10 mg daily until better then resume the 5 mg daily per med calendar  Augmentin 875 mg take one pill twice daily  X 10 days - take at breakfast and supper with large glass of water.  It would help reduce the usual side effects (diarrhea and yeast infections) if you ate cultured yogurt  at lunch.  For cough/ congestion  > mucinex dm 1200 mg every 12 hours as needed  Remember afrin prior to flonase x maximum of 5 days     08/21/2018  f/u ov/[name] re: GOLD IV  / 02 dep 2lpm rest and up to 4 lpm ex/ pred 10 mg  Chief Complaint  Patient presents with  . Follow-up    still coughing some and producing some green sputum. She is using her albuterol inhaler 2 x daily.    Dyspnea:  Improving and now = mb and back ok / [employer] ok on 3lpm Cough: better, minimal am discolored mucus  Sleeping: bed flat / 2 pillows  SABA use: less lately, usually only when over does it in heat with activity  02: 2lpm as high 4 ex usually in low 90s    No obvious day to day or daytime variability or assoc   mucus plugs or hemoptysis or cp or chest tightness, subjective wheeze or overt sinus or hb symptoms.   Sleeping as above  without nocturnal  or early am exacerbation  of respiratory  c/o's or need for noct saba. Also denies any obvious fluctuation of symptoms with weather or environmental changes or other aggravating or alleviating factors except as outlined above   No unusual exposure hx or h/o childhood pna/ asthma or knowledge of premature birth.  Current Allergies, Complete Past Medical History, Past Surgical History, Family History, and Social History were reviewed in [employer] record.  ROS  The following are not active complaints unless bolded Hoarseness, sore throat, dysphagia, dental problems, itching, sneezing,  nasal congestion or discharge of excess mucus or purulent secretions, ear ache,   fever, chills, sweats, unintended wt loss or wt gain, classically pleuritic or exertional cp,  orthopnea pnd or arm/hand swelling  or leg swelling, presyncope, palpitations, abdominal pain, anorexia, nausea, vomiting, diarrhea  or change in bowel habits or change in bladder habits, change in stools or change in urine, dysuria, hematuria,  rash, arthralgias, visual complaints, headache, numbness, weakness or ataxia  or problems with walking or coordination,  change in mood or  memory.        Current Meds  Medication Sig  . acetaminophen (TYLENOL) 500 MG tablet Per bottle as needed  . albuterol (VENTOLIN HFA) 108 (90 Base) MCG/ACT inhaler Inhale 2 puffs into the lungs every 6 (six) hours as needed for wheezing or shortness of breath.  . anastrozole (ARIMIDEX) 1 MG tablet Take 1 tablet (1 mg total) by mouth daily.  Marland Kitchen aspirin EC 81 MG tablet Take 81 mg by mouth daily.  Marland Kitchen atorvastatin (LIPITOR) 20 MG tablet Take 1 tablet (20 mg total) by mouth daily.  . famotidine (PEPCID) 20 MG tablet Take 20 mg by mouth at bedtime.  . fluticasone (FLONASE) 50 MCG/ACT nasal spray Place 2 sprays into both nostrils daily as needed for allergies.  Marland Kitchen gabapentin (NEURONTIN) 300 MG capsule Take 2 capsules (600 mg total) by mouth at bedtime. 2 at bedtime  . Guaifenesin (MUCINEX MAXIMUM STRENGTH) 1200 MG TB12 Take 1,200 mg by mouth 2 (two) times daily as needed.  . hydroxychloroquine (PLAQUENIL) 200 MG tablet Take 1 tablet (200 mg total) by mouth daily.  . metoprolol tartrate (LOPRESSOR) 25 MG tablet Take 1 tablet (25 mg total) by mouth 2 (two) times daily.  . Multiple Vitamin (MULTIVITAMIN) capsule Take 1 capsule by mouth daily.  Marland Kitchen omeprazole (PRILOSEC) 20 MG capsule TAKE 1 CAPSULE BY MOUTH ONCE DAILY  .  ondansetron (ZOFRAN) 4 MG tablet Take 1 tablet (4 mg total) by mouth every 8 (eight) hours as needed for nausea or vomiting.  . OXYGEN 2lpm with rest and 3-4 lpm with exertion  . predniSONE (DELTASONE) 5 MG tablet Take 1 tablet (5 mg total) by mouth daily with breakfast. As directed  . sertraline (ZOLOFT) 50 MG tablet Take 1 tablet (50 mg total) by mouth daily.  Marland Kitchen STIOLTO RESPIMAT 2.5-2.5 MCG/ACT AERS INHALE 2 PUFFS INTO THE LUNGS DAILY  . 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 Unice Cobble  -h/o Daily prednisone since 2007   > off completely Feb 2012 with no problem> restarted  09/2016  ? 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 Some better but no worse off 04/2011 - Rehab started mid aug 2018  BACK PAIN, LOW (ICD-724.2)  ANEMIA, IRON DEFICIENCY, UNSPEC. (ICD-280.9)  Health Maintenance.......................................................   [facility]   Family History:  crohn`s in daughter, M-lupus, htn, asthma,  no Ca, DM, CAD    Social History:   lives with twin children and grandson. single; >20 pack year history, quit in 2005; no EtOH  Laid off  from works for [employer] Sept 2013           Objective:   Physical Exam wt 166 Oct 12, 2009 >170 October 28, 2010 > 169 06/29/2011 >>171 08/24/2011 > 01/15/2012  171 > 04/24/2012 176 > 06/28/2012  175> 08/13/2012 > 08/30/2012  173 > 170 11/28/2012 > 01/29/2013  176 > 174 >173 06/16/2013 > 171 09/18/2013 > 12/19/2013 177 >179 04/13/2014>  04/23/2014 177 > 08/21/2014 184 >182 11/27/2014 > 05/12/2015 177  > 05/11/2016   177 > 10/27/2016  175 > 11/09/2016 182 > 01/09/2017 198 > 02/20/2017  206 >  07/17/2017   209 >  09/03/2017   210 > 09/28/2017  206  > 11/26/2017   209 > 01/10/2018  210> 02/19/2018  206> 05/21/2018  198 > 08/06/2018  197 > 08/21/2018  196   amb bf dry sounding cough    Vital signs reviewed - Note on arrival 02 sats  94% on 2lpm poc            HEENT: Edentulous , nl  urbinates bilaterally, and oropharynx.  Nl external ear canals without cough reflex   NECK :  without JVD/Nodes/TM/ nl carotid upstrokes bilaterally   LUNGS: no acc muscle use,  Nl contour chest which is clear to A and P bilaterally without cough on insp or exp maneuvers   CV:  RRR  no s3 or murmur or increase in P2, and no edema   ABD:  soft and nontender with nl inspiratory excursion in the supine position. No bruits or organomegaly appreciated, bowel sounds nl  MS:  Nl gait/ ext warm without deformities, calf tenderness, cyanosis or clubbing  No obvious joint restrictions   SKIN: warm and dry with typical very small hyperpigmented plaques under R nostril, both knees      NEURO:  alert, approp, nl sensorium with  no motor or cerebellar deficits apparent.       Lab Results  Component Value Date   ESRSEDRATE 80 (H) 08/21/2018   ESRSEDRATE 94 (H) 02/19/2018   ESRSEDRATE 97 Repeated and verified X2. (H) 01/10/2018       Chemistry      Component Value Date/Time                                                    Component Value Date/Time         ALKPHOS 67 08/21/2018 1440   AST 14 08/21/2018 1440   ALT 11 08/21/2018 1440   BILITOT 0.3 08/21/2018 1440   BILITOT 0.4 02/05/2017 1530        . Assessment:

## 2018-08-22 ENCOUNTER — Encounter: Payer: Self-pay | Admitting: Internal Medicine

## 2018-08-22 NOTE — Progress Notes (Signed)
Spoke with pt and notified of results per Dr. Wert. Pt verbalized understanding and denied any questions. 

## 2018-08-22 NOTE — Assessment & Plan Note (Signed)
Positive transbronchial biopsy 02/19/91 by Dr. Unice Cobble  -Daily prednisone since 2007 with ? adrenal insufficiency iatrogenic > off completely Feb 2012 with no recurrence - PFT's 06/26/08 FEV1  1.09 ratio  - PFTs  03/02/10 FEV1  1.06 (42%) ratio 34 and no better p B2,  DLC0 48% corrects to 64% - PFT's 08/01/2011 FEV1 1.6 (42%)  41 ratio  DLCO 55 corrects 77 - PFTs 10/10/16    FEV1  0.87(38)ratio 41 and DLCO 36/39c corrects to 51 for alv vol > placed back on daily pred  - 02/20/2017 decreased pred to 5 mg daily > to 2.6m daily as of 07/17/2017  - 07/17/2017 try 2.5 mg qod > 09/28/2017 changed to 20 mg until better and new floor of 5 mg daily plus consider plaquenil due to new skin involvement on face  - opth eval neg 11/10/17 - Start plaquenil 200 mg daily 11/26/2017  - 01/10/2018 ESR up to 97 with pred taper so rec bid plaquenil > could only tol 200 mg daily  - 02/19/2018 ESR = 94 on 564mdaily  - 05/21/2018 referred to dermatology   - 08/21/2018   ESR = 80  On prednisone 10 mg/ plaquenil 200 mg daily > no change rx   Could not tol higher doses of plaquenil in past and skin is minimally involved at this point so continue present rx  The goal with a chronic steroid dependent illness is always arriving at the lowest effective dose that controls the disease/symptoms and not accepting a set "formula" which is based on statistics or guidelines that don't always take into account patient  variability or the natural hx of the dz in every individual patient, which may well vary over time.  For now therefore I recommend the patient maintain  10 mg per day ceiling and 5 mg per day floor

## 2018-08-22 NOTE — Assessment & Plan Note (Signed)
Quit smoking 2005  - PFT's 06/26/08 FEV1 1.09 ratio  - PFTs 03/02/10 FEV1 1.06 ratio 34    PFT's 08/01/2011 FEV1 1.6 (42%)  41 ratio  DLCO 55 corrects 77 - Spiriva trial March 02, 2010 > ? better but not worse off it 04/2011 - 05/12/2015 p extensive coaching HFA effectiveness =    90%  - Referred to Rehab 08/21/2014 > could not arrange due to schedule  -med calendar 06/16/2013 > did not bring to office as requested 12/19/13 or 04/23/14  Or 08/21/2014  - 11/09/2016    changed symb to bevespi since taking chronic pred for pf    - 02/20/2017 decreased pred to 5 mg daily > weaned to 2.5 mg qd as of 07/17/2017  - started rehab mid Aug 2018  - 07/17/2017 changed to pred 2.5 even days > did not tolerate so changed  Around  07/28/17 back to 5 mg qod  - 09/03/2017  After extensive coaching HFA effectiveness =    90% rx stiolto  - Spirometry 09/03/2017  FEV1 0.52 (23%)  Ratio 40 with typical curvature   - PFT's  02/19/2018  FEV1 0.65 (29 % ) ratio 43  p 1 % improvement from saba p stiolto/ pred 5 mg  prior to study with DLCO  39 % corrects to 56  % for alv volume     Group D in terms of symptom/risk and laba/lama/ICS  therefore appropriate rx at this point ( the prednisone is needed systemically for sarcoid so no need for ics here unless airway symptoms flare at lower doses prednisone

## 2018-08-22 NOTE — Assessment & Plan Note (Signed)
New start 09/2016 at admit see records - 10/18/16 Patient Saturations on Room Air at Rest = 92% Patient Saturations on Room Air while Ambulating = 84% Patient Saturations on 2 Liters of oxygen while Ambulating = 90% - HCO3  11/09/2016 = 39  - HCO3   03/16/17        = 34  - HC03    01/10/2018  = 40   As of  08/21/2018  rec 2lpm sleeping and titrate with ambulation to maintain > 90%     Adequate control on present rx, reviewed in detail with pt > no change in rx needed     I had an extended discussion with the patient reviewing all relevant studies completed to date and  lasting 15 to 20 minutes of a 25 minute visit    Each maintenance medication was reviewed in detail including most importantly the difference between maintenance and prns and under what circumstances the prns are to be triggered using an action plan format that is not reflected in the computer generated alphabetically organized AVS but trather by a customized med calendar that reflects the AVS meds with confirmed 100% correlation.   In addition, Please see AVS for unique instructions that I personally wrote and verbalized to the the pt in detail and then reviewed with pt  by my nurse highlighting any  changes in therapy recommended at today's visit to their plan of care.

## 2018-08-24 DIAGNOSIS — J449 Chronic obstructive pulmonary disease, unspecified: Secondary | ICD-10-CM | POA: Diagnosis not present

## 2018-08-24 DIAGNOSIS — J9611 Chronic respiratory failure with hypoxia: Secondary | ICD-10-CM | POA: Diagnosis not present

## 2018-08-30 ENCOUNTER — Ambulatory Visit: Payer: BLUE CROSS/BLUE SHIELD | Admitting: Family Medicine

## 2018-08-30 ENCOUNTER — Other Ambulatory Visit: Payer: Self-pay

## 2018-08-30 ENCOUNTER — Encounter: Payer: Self-pay | Admitting: Family Medicine

## 2018-08-30 VITALS — BP 120/70 | HR 74 | Temp 98.5°F | Ht 65.0 in | Wt 196.0 lb

## 2018-08-30 DIAGNOSIS — E785 Hyperlipidemia, unspecified: Secondary | ICD-10-CM | POA: Diagnosis not present

## 2018-08-30 DIAGNOSIS — N898 Other specified noninflammatory disorders of vagina: Secondary | ICD-10-CM | POA: Diagnosis not present

## 2018-08-30 LAB — POCT WET PREP (WET MOUNT)
Clue Cells Wet Prep Whiff POC: NEGATIVE
TRICHOMONAS WET PREP HPF POC: ABSENT

## 2018-08-30 NOTE — Assessment & Plan Note (Addendum)
Patient complaining of vaginal itching and discharge.  Has been previously positive for trichomoniasis.  Self swab was performed in the clinic, which was negative.  No need to prescribe anything.

## 2018-08-30 NOTE — Assessment & Plan Note (Signed)
Patient is currently on 20mg  lipitor. No side effects such as muscle pain.  She has not had her cholesterol checked in 1.5 years. We will recheck it today and increase dosage if necessary.

## 2018-08-30 NOTE — Progress Notes (Signed)
   [facility] Phone: [phone]   cc: meet new provider, medication questions, vaginal itching  Subjective:  Patient wanted to meet her new provider and discuss some of her medications and if they are necessary.  We went through every medication that was listed for her and I explained what they were for.  We did not find any medication that was unnecessary. She understood why she was taking them and asked questions when appropriate.  Patient is taking lipitor and was questioning if she needed it.  She said she would like to get her cholesterol levels checked.  She denies having any muscle pains.    Patient said she has been having vaginal itching and some 'light' discharge since taking antibiotics.  Patient agreed to do a self swab in the clinic.    ROS: See HPI for pertinent positives and negatives   Objective: BP 120/70   Pulse 74   Temp 98.5 F (36.9 C) (Oral)   Ht 5\' 5"  (1.651 m)   Wt 196 lb (88.9 kg)   SpO2 96% Comment: on 2 liters  BMI 32.62 kg/m  Gen: NAD, alert and oriented, cooperative with exam HEENT: NCAT, EOMI, MMM CV: normal rate, regular rhythm. Distant heart sounds with a slight systolic murmur.   Resp: LCTAB, no wheezes, crackles. normal work of breathing GI: nontender to palpation, BS present, no guarding or organomegaly Msk: No edema, warm, normal tone, moves UE/LE spontaneously Neuro: CN II-XII grossly intact. no gross deficits Psych: Appropriate behavior.  Patient is not having depressive thoughts.  Full affect. Good eye contact.   Assessment/Plan: Vaginal discharge Patient complaining of vaginal itching and discharge.  Has been previously positive for trichomoniasis.  Self swab was performed in the clinic, which was negative.  No need to prescribe anything.   Hyperlipidemia Patient is currently on 20mg  lipitor. No side effects such as muscle pain.  She has not had her cholesterol checked in 1.5 years. We will recheck it today and  increase dosage if necessary.   Medication reconciliation We went over all of patient's listed medications. Of the medications listed she was taking, there were none that she did not need to be on.  The only possibility was gabapentin for hot flashes, which was prescribed by another doctor, so  I told patient to discuss that with him during her next appointment with him.   Clemetine Marker, MD PGY-1

## 2018-08-30 NOTE — Patient Instructions (Addendum)
It was nice to meet you today Ms. [name],   We discussed your medications and I could not find any medications you were taking which you did not need to be on.  You can discuss with Dr. Lindi Adie if you still need to be on the Gabapentin when you see him next time.    We tested your cholesterol levels and I will let you know what the results are.  If they are too high we can discuss increasing the dose of the Lipitor.    We tested you for an infection because you were having vaginal itching and discharge.  I will let you know what the results are and I will call in the prescription to your pharmacy.    Have a great day,   Clemetine Marker, MD

## 2018-08-31 LAB — LIPID PANEL
CHOL/HDL RATIO: 2.5 ratio (ref 0.0–4.4)
Cholesterol, Total: 157 mg/dL (ref 100–199)
HDL: 64 mg/dL (ref >39)
LDL Calculated: 76 mg/dL (ref 0–99)
Triglycerides: 84 mg/dL (ref 0–149)
VLDL Cholesterol Cal: 17 mg/dL (ref 5–40)

## 2018-09-02 ENCOUNTER — Encounter: Payer: Self-pay | Admitting: Family Medicine

## 2018-09-24 DIAGNOSIS — J449 Chronic obstructive pulmonary disease, unspecified: Secondary | ICD-10-CM | POA: Diagnosis not present

## 2018-09-24 DIAGNOSIS — J9611 Chronic respiratory failure with hypoxia: Secondary | ICD-10-CM | POA: Diagnosis not present

## 2018-09-26 ENCOUNTER — Telehealth: Payer: Self-pay | Admitting: Internal Medicine

## 2018-09-26 NOTE — Telephone Encounter (Signed)
Called and spoke with patient, I have advised patient that we have not received the forms. Patient stated that her insurance company told her they would be sending them to [place]. I verified fax number with patient, she is calling her insurance company to have them fax it over again. Nothing further needed.

## 2018-10-03 ENCOUNTER — Telehealth: Payer: Self-pay | Admitting: Internal Medicine

## 2018-10-04 NOTE — Telephone Encounter (Signed)
Rec'd completed forms - fwd to [employer] via interoffice mail -pr  °

## 2018-10-04 NOTE — Telephone Encounter (Signed)
Done and given to [facility]

## 2018-10-04 NOTE — Telephone Encounter (Signed)
I have placed this on Dr Gustavus Bryant desk to be signed

## 2018-10-04 NOTE — Telephone Encounter (Signed)
Done

## 2018-10-08 ENCOUNTER — Encounter: Payer: Self-pay | Admitting: Family Medicine

## 2018-10-09 ENCOUNTER — Other Ambulatory Visit: Payer: Self-pay | Admitting: Family Medicine

## 2018-10-09 DIAGNOSIS — K219 Gastro-esophageal reflux disease without esophagitis: Secondary | ICD-10-CM

## 2018-10-17 ENCOUNTER — Telehealth: Payer: Self-pay | Admitting: Internal Medicine

## 2018-10-17 NOTE — Telephone Encounter (Signed)
Called and spoke with [name] at [facility], she is requesting an update on paper work she faxed on 12/3. I asked that [name] refax as no papers are in [facility] box or with his nurse. [name] is to refax papers. Once received we will give to [employer] for completion.

## 2018-10-21 NOTE — Telephone Encounter (Signed)
Spoke with [name] and she has not received any paperwork on this patient for Dr. Melvyn Novas.   Will wait for fax

## 2018-10-22 NOTE — Telephone Encounter (Signed)
There have been no forms or fax received. Called Kim with [employer]. Asked for her to fax over form for [place] to provide MW. Verified fax number. She stated she will fax it over now. Will await form.

## 2018-10-22 NOTE — Telephone Encounter (Signed)
Forms given to patrice to send to [employer] per protocol

## 2018-10-23 NOTE — Telephone Encounter (Signed)
[name] if you could verify when forms are faxed then we can closet his message. Thank you

## 2018-10-24 DIAGNOSIS — J9611 Chronic respiratory failure with hypoxia: Secondary | ICD-10-CM | POA: Diagnosis not present

## 2018-10-24 DIAGNOSIS — J449 Chronic obstructive pulmonary disease, unspecified: Secondary | ICD-10-CM | POA: Diagnosis not present

## 2018-10-25 NOTE — Telephone Encounter (Signed)
Call made to [employer], they have received the paperwork and they should be faxing it today as they received it on the 4th. Call made to [facility] with Christella Scheuermann, left message informing her that the paperwork would be coming today and if she did not receive it to give [place] a call. Nothing further is needed at this time.

## 2018-10-28 DIAGNOSIS — D869 Sarcoidosis, unspecified: Secondary | ICD-10-CM | POA: Diagnosis not present

## 2018-10-28 DIAGNOSIS — D225 Melanocytic nevi of trunk: Secondary | ICD-10-CM | POA: Diagnosis not present

## 2018-10-28 DIAGNOSIS — L821 Other seborrheic keratosis: Secondary | ICD-10-CM | POA: Diagnosis not present

## 2018-11-01 ENCOUNTER — Telehealth: Payer: Self-pay | Admitting: Internal Medicine

## 2018-11-01 NOTE — Telephone Encounter (Signed)
Placed in Dr Gustavus Bryant lookat box

## 2018-11-04 NOTE — Telephone Encounter (Signed)
done

## 2018-11-04 NOTE — Telephone Encounter (Signed)
Given to patrice  

## 2018-11-04 NOTE — Telephone Encounter (Signed)
Rec'd completed forms - Fwd to [employer] via interoffice mail -pr  °

## 2018-11-22 ENCOUNTER — Encounter: Payer: Self-pay | Admitting: Internal Medicine

## 2018-11-22 ENCOUNTER — Ambulatory Visit: Payer: BLUE CROSS/BLUE SHIELD | Admitting: Internal Medicine

## 2018-11-22 DIAGNOSIS — D869 Sarcoidosis, unspecified: Secondary | ICD-10-CM | POA: Diagnosis not present

## 2018-11-22 DIAGNOSIS — J9612 Chronic respiratory failure with hypercapnia: Secondary | ICD-10-CM | POA: Diagnosis not present

## 2018-11-22 DIAGNOSIS — J449 Chronic obstructive pulmonary disease, unspecified: Secondary | ICD-10-CM

## 2018-11-22 DIAGNOSIS — J9611 Chronic respiratory failure with hypoxia: Secondary | ICD-10-CM | POA: Diagnosis not present

## 2018-11-22 LAB — BASIC METABOLIC PANEL
BUN: 20 mg/dL (ref 6–23)
CALCIUM: 12.1 mg/dL — AB (ref 8.4–10.5)
CHLORIDE: 96 meq/L (ref 96–112)
CO2: 38 meq/L — AB (ref 19–32)
CREATININE: 1.5 mg/dL — AB (ref 0.40–1.20)
GFR: 45.72 mL/min — ABNORMAL LOW (ref >60.00)
GLUCOSE: 108 mg/dL — AB (ref 70–99)
Potassium: 4.2 mEq/L (ref 3.5–5.1)
SODIUM: 139 meq/L (ref 135–145)

## 2018-11-22 LAB — CBC WITH DIFFERENTIAL/PLATELET
Basophils Absolute: 0 10*3/uL (ref 0.0–0.1)
Basophils Relative: 0.4 % (ref 0.0–3.0)
EOS PCT: 3.1 % (ref 0.0–5.0)
Eosinophils Absolute: 0.2 10*3/uL (ref 0.0–0.7)
HEMATOCRIT: 30.2 % — AB (ref 36.0–46.0)
HEMOGLOBIN: 9.8 g/dL — AB (ref 12.0–15.0)
LYMPHS PCT: 9.8 % — AB (ref 12.0–46.0)
Lymphs Abs: 0.7 10*3/uL (ref 0.7–4.0)
MCHC: 32.3 g/dL (ref 30.0–36.0)
MCV: 81.8 fl (ref 78.0–100.0)
Monocytes Absolute: 0.6 10*3/uL (ref 0.1–1.0)
Monocytes Relative: 7.9 % (ref 3.0–12.0)
NEUTROS ABS: 5.8 10*3/uL (ref 1.4–7.7)
Neutrophils Relative %: 78.8 % — ABNORMAL HIGH (ref 43.0–77.0)
PLATELETS: 303 10*3/uL (ref 150.0–400.0)
RBC: 3.69 Mil/uL — ABNORMAL LOW (ref 3.87–5.11)
RDW: 15.7 % — ABNORMAL HIGH (ref 11.5–15.5)
WBC: 7.4 10*3/uL (ref 4.0–10.5)

## 2018-11-22 LAB — SEDIMENTATION RATE: Sed Rate: 113 mm/hr — ABNORMAL HIGH (ref 0–30)

## 2018-11-22 LAB — HEPATIC FUNCTION PANEL
ALBUMIN: 3.7 g/dL (ref 3.5–5.2)
ALT: 11 U/L (ref 0–35)
AST: 16 U/L (ref 0–37)
Alkaline Phosphatase: 67 U/L (ref 39–117)
Bilirubin, Direct: 0.1 mg/dL (ref 0.0–0.3)
TOTAL PROTEIN: 8 g/dL (ref 6.0–8.3)
Total Bilirubin: 0.3 mg/dL (ref 0.2–1.2)

## 2018-11-22 NOTE — Progress Notes (Signed)
Spoke with pt and notified of results per Dr. Wert. Pt verbalized understanding and denied any questions. 

## 2018-11-22 NOTE — Progress Notes (Signed)
Subjective:    Patient ID: [name], female   DOB: [birth year]    MRN: [id number]    Brief patient profile:  57 yobf quit smoking 10/2004 dx by transbronchial biopsy 5/92 with NCG  consistent with sarcoid. Since that time she's been off and on prednisone multiple  times with flares of coughing dyspnea and skin involvement > weaned off chronic prednisone Feb 2012 and placed back on 10/10/16 for deteriorating obstructive changes on pfts  With resp failure/ 02 dep        History of Present Illness  02/20/2017  f/u ov/Wert re:   GOLD III/ pred 10 mg daily and bevespi 2bid  and alb rare  Chief Complaint  Patient presents with  . Follow-up    6wk rov. pt states breathing is baseline. pt reports of sob with exertion & occ prod cough with green mucus mainly in the morning   Doe better :  [name] = can't walk a nl pace on a flat grade s sob but does fine slow and flat eg shopping on  2lpm / ok at rest s 02  Uses 02 2lpm sleeping  rec Drop prednisone to 10 mg one half daily      NP eval 05/14/17  We will decrease your prednisone to 2.5 mg daily. We will prescribe 5 mg tablets. We will renew your prescription for Symbicort.  2 puffs twice daily. Rinse mouth after use. Follow up appointment with Dr. Melvyn Novas in 2 months. We will refer you to Pulmonary rehab > started Aug 2018  Referral to a dietician for weight loss >  at rehab     Date of Admission: 09/07/2017                    Date of Discharge: 09/09/17    Discharge Diagnoses/Problem List:  COPD GOLD stage IV HTN Anemia HLD Breast cancer s/p resection Sarcoidosis GERD      11/26/2017  f/u ov/Wert re:  Sarcoid/skin involvement chronic cough ? Sinus dz/ using med calendar well / pednisone 10 mg daily  Chief Complaint  Patient presents with  . Follow-up    Cough has improved some, but still producing some greenish yellow sputum first thing in the am.  She has not had to use her albuterol inhaler. She c/o shakiness in her  hands for the past couple of months.    less cough p augmentin but still am sputum green / no noct symptoms Doing well with rehab but sometimes has to turn 02 up to 4 lpm there but doesn't do so in other settings and concerned about wt gain  rec Start plaquenil 200 mg daily  Ok to adjust the prednisone 58m  from 1-2 pills in am as per med calendar See calendar for specific medication instructions    01/10/2018  f/u ov/Wert re:  Sarcoid/ plaquenil 200 mg daily plus pred 5 mg daily / last time off was one year prior/ 02 = 2lpm floor and 4lpm with ex Chief Complaint  Patient presents with  . Follow-up    repeat LFT's today. She states she has noticed some shaking in her hands for a while now and forgot to mention this at last ov. Her breathing is doing well and she is down to 5 mg pred.   Dyspnea:  Ex bike x 15-30 min / low resistance and 02 up to 3-4lpm sats in mid 90s Cough: no Sleep: on 2lpm / one pillow  SABA use:  Not  needing  rec Try prednisone 5 mg  Reduce to one half daily to see if not doing as well and if not increase prednisone to 5 mg and call me for higher dose of plaquenil  Late add: with esr high and lfts ok rec bid plaquenil and return in one month for pfts/lfts> could not tol 400 mg daily due to n and v but ok on 200 mg daily      05/21/2018  f/u ov/Sukhmani Fetherolf re:  codp GOLD IV/ 02 dep on pred 5 mg x 2  daily for sarcoid / intol of plaquenil > 200 mg (N/V) Using med cal well  Chief Complaint  Patient presents with  . Follow-up    Breathing unchanged since the last visit. She has prod cough in the am's with min green sputum. She is using her albuterol inhaler 1 x per wk on average.   Dyspnea:  On 3-4 lpm does [employer] / also able to do ex bike x 15 min  Cough: just in am , slt discolored mucus  SABA use: rare, usually if goes out in hear  02: 2lpm hs and 3-4 lpm daytime   rec Please see patient coordinator before you leave today  to schedule referral to dermatology  No change  in medications except use prednisone 5 mg  X 2 when breathing/ coughing worse then 1 daily thereafter Follow the medication calendar we reviewed today     08/06/2018 acute extended ov/Nicky Milhouse re: copd/ Gold IV/ 02 dep  And pred 5 mg daily for sarcoid  Chief Complaint  Patient presents with  . Acute Visit    Pt c/o cough, runny nose and congestion x 5 days. Cough is prod with green sputum. She also c/o right ear pain.    02  2lpm and up 4 with activity  Acute onset 08/02/18 with ST, nasal congestion with greens mucus / R ear ache but no sob over baseline rec Prednisone should be 10 mg daily until better then resume the 5 mg daily per med calendar  Augmentin 875 mg take one pill twice daily  X 10 days - take at breakfast and supper with large glass of water.  It would help reduce the usual side effects (diarrhea and yeast infections) if you ate cultured yogurt at lunch.  For cough/ congestion > mucinex dm 1200 mg every 12 hours as needed  Remember afrin prior to flonase x maximum of 5 days     08/21/2018  f/u ov/Kalden Wanke re: GOLD IV  / 02 dep 2lpm rest and up to 4 lpm ex/ pred 10 mg  Chief Complaint  Patient presents with  . Follow-up    still coughing some and producing some green sputum. She is using her albuterol inhaler 2 x daily.    Dyspnea:  Improving and now = mb and back ok / [employer] ok on 3lpm Cough: better, minimal am discolored mucus  Sleeping: bed flat / 2 pillows  SABA use: less lately, usually only when over does it in heat with activity  02: 2lpm as high 4 ex usually in low 90s  rec No change rx    11/22/2018  f/u ov/Saket Hellstrom re:  Sarcooid/ copd/ 02 dep resp failure/ indolent onset x 3  weeks no appetite fatigue  Increased sob on pred 5 mg daily  Chief Complaint  Patient presents with  . Follow-up    Pt c/o increased SOB and cough the past few days. Her cough is prod at times with  yellow to clear sputum. She states her mouth is very dry in the mornings. Her appetite is poor and  she is down 9 lbs since the last visit. She is using her albuterol inhaler 2 x per wk on average.    Dyspnea:  mb and back flat ok on 3lpm Pulsed when walks  Cough: none Sleeping: on side bed is horizontal SABA use: couple of times a week 02: 2lpm and up to 3lpm walk   No obvious day to day or daytime variability or assoc excess/ purulent sputum or mucus plugs or hemoptysis or cp or chest tightness, subjective wheeze or overt sinus or hb symptoms.   Sleeping as above  without nocturnal  or early am exacerbation  of respiratory  c/o's or need for noct saba. Also denies any obvious fluctuation of symptoms with weather or environmental changes or other aggravating or alleviating factors except as outlined above   No unusual exposure hx or h/o childhood pna/ asthma or knowledge of premature birth.  Current Allergies, Complete Past Medical History, Past Surgical History, Family History, and Social History were reviewed in [employer] record.  ROS  The following are not active complaints unless bolded Hoarseness, sore throat, dysphagia, dental problems, itching, sneezing,  nasal congestion or discharge of excess mucus or purulent secretions, ear ache,   fever, chills, sweats, unintended wt loss or wt gain, classically pleuritic or exertional cp,  orthopnea pnd or arm/hand swelling  or leg swelling, presyncope, palpitations, abdominal pain, anorexia, nausea, vomiting, diarrhea  or change in bowel habits or change in bladder habits, change in stools or change in urine, dysuria, hematuria,  rash, arthralgias, visual complaints, headache, numbness, weakness or ataxia or problems with walking or coordination,  change in mood or  memory.        Current Meds  Medication Sig  . acetaminophen (TYLENOL) 500 MG tablet Per bottle as needed  . albuterol (VENTOLIN HFA) 108 (90 Base) MCG/ACT inhaler Inhale 2 puffs into the lungs every 6 (six) hours as needed for wheezing or shortness of  breath.  . anastrozole (ARIMIDEX) 1 MG tablet Take 1 tablet (1 mg total) by mouth daily.  Marland Kitchen aspirin EC 81 MG tablet Take 81 mg by mouth daily.  Marland Kitchen atorvastatin (LIPITOR) 20 MG tablet Take 1 tablet (20 mg total) by mouth daily.  . fluticasone (FLONASE) 50 MCG/ACT nasal spray Place 2 sprays into both nostrils daily as needed for allergies.  Marland Kitchen gabapentin (NEURONTIN) 300 MG capsule Take 2 capsules (600 mg total) by mouth at bedtime. 2 at bedtime  . Guaifenesin (MUCINEX MAXIMUM STRENGTH) 1200 MG TB12 Take 1,200 mg by mouth 2 (two) times daily as needed.  . hydroxychloroquine (PLAQUENIL) 200 MG tablet Take 1 tablet (200 mg total) by mouth daily.  . metoprolol tartrate (LOPRESSOR) 25 MG tablet Take 1 tablet (25 mg total) by mouth 2 (two) times daily.  . Multiple Vitamin (MULTIVITAMIN) capsule Take 1 capsule by mouth daily.  Marland Kitchen omeprazole (PRILOSEC) 20 MG capsule TAKE 1 CAPSULE BY MOUTH ONCE DAILY  . OXYGEN 2lpm with rest and 3-4 lpm with exertion  . predniSONE (DELTASONE) 5 MG tablet Take 1 tablet (5 mg total) by mouth daily with breakfast. As directed  . sertraline (ZOLOFT) 50 MG tablet Take 1 tablet (50 mg total) by mouth daily.  Marland Kitchen STIOLTO RESPIMAT 2.5-2.5 MCG/ACT AERS INHALE 2 PUFFS INTO THE LUNGS DAILY  . 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 Unice Cobble  -h/o Daily prednisone since 2007   > off completely Feb 2012 with no problem> restarted  09/2016  ? 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 Some better but no worse off 04/2011 - Rehab started mid aug 2018  BACK PAIN, LOW (ICD-724.2)  ANEMIA, IRON DEFICIENCY, UNSPEC. (ICD-280.9)  Health Maintenance.......................................................   [facility]   Family History:  crohn`s in daughter, M-lupus, htn, asthma,  no Ca, DM, CAD    Social History:   lives with twin children and grandson. single; >20 pack year history, quit in 2005; no EtOH  Laid off  from works for [employer] Sept 2013           Objective:   Physical Exam wt 166 Oct 12, 2009 >170 October 28, 2010 > 169 06/29/2011 >>171 08/24/2011 > 01/15/2012  171 > 04/24/2012 176 > 06/28/2012  175> 08/13/2012 > 08/30/2012  173 > 170 11/28/2012 > 01/29/2013  176 > 174 >173 06/16/2013 > 171 09/18/2013 > 12/19/2013 177 >179 04/13/2014>  04/23/2014 177 > 08/21/2014 184 >182 11/27/2014 > 05/12/2015 177  > 05/11/2016   177 > 10/27/2016  175 > 11/09/2016 182 > 01/09/2017 198 > 02/20/2017  206 >  07/17/2017   209 >  09/03/2017   210 > 09/28/2017  206  > 11/26/2017   209 > 01/10/2018  210> 02/19/2018  206> 05/21/2018  198 > 08/06/2018  197 > 08/21/2018  196 > 11/22/2018  187    amb bf nad     Vital signs reviewed - Note on arrival 02 sats  95% on 2 lpm pulsed 02    HEENT: nl dentition, turbinates bilaterally, and oropharynx. Nl external ear canals without cough reflex   NECK :  without JVD/Nodes/TM/ nl carotid upstrokes bilaterally   LUNGS: no acc muscle use,  Nl contour chest which is distant bs  bilaterally without cough on insp or exp maneuvers   CV:  RRR  no s3 or murmur or increase in P2, and no edema   ABD:  soft and nontender with nl inspiratory excursion in the supine position. No bruits or organomegaly appreciated, bowel sounds nl  MS:  Nl gait/ ext warm without deformities, calf tenderness, cyanosis or clubbing No  obvious joint restrictions   SKIN: warm and dry with minimally pigmented plaque under R nostril and both knees.  NEURO:  alert, approp, nl sensorium with  no motor or cerebellar deficits apparent.    Labs ordered/ reviewed:      Chemistry      Component Value Date/Time   NA 139 11/22/2018 1433   NA 145 (H) 02/05/2017 1530   K 4.2 11/22/2018 1433   CL 96 11/22/2018 1433   CO2 38 (H) 11/22/2018 1433   BUN 20 11/22/2018 1433   BUN 13 02/05/2017 1530   CREATININE 1.50 (H) 11/22/2018 1433   CREATININE 0.64 02/04/2015 1619      Component Value Date/Time  CALCIUM 12.1 (H) 11/22/2018 1433   ALKPHOS 67 11/22/2018 1433   AST 16 11/22/2018 1433   ALT 11 11/22/2018 1433   BILITOT 0.3 11/22/2018 1433   BILITOT 0.4 02/05/2017 1530  Albumin 11/22/2018  = 3.7       Lab Results  Component Value Date   WBC 7.4 11/22/2018   HGB 9.8 (L) 11/22/2018   HCT 30.2 (L) 11/22/2018   MCV 81.8 11/22/2018   PLT 303.0 11/22/2018              Lab Results  Component Value Date   ESRSEDRATE 113 (H) 11/22/2018   ESRSEDRATE 80 (H) 08/21/2018   ESRSEDRATE 94 (H) 02/19/2018           Assessment:

## 2018-11-22 NOTE — Patient Instructions (Addendum)
Please remember to go to the lab department   for your tests - we will call you with the results when they are available.  See calendar for specific medication instructions and bring it back for each and every office visit for every healthcare provider you see.  Without it,  you may not receive the best quality medical care that we feel you deserve.  You will note that the calendar groups together  your maintenance  medications that are timed at particular times of the day.  Think of this as your checklist for what your doctor has instructed you to do until your next evaluation to see what benefit  there is  to staying on a consistent group of medications intended to keep you well.  The other group at the bottom is entirely up to you to use as you see fit  for specific symptoms that may arise between visits that require you to treat them on an as needed basis.  Think of this as your action plan or "what if" list.   Separating the top medications from the bottom group is fundamental to providing you adequate care going forward.        Take 2 prednisone daily x 3 days to see if you note any difference in any of your symptoms and if so remain on 10 mg daily   Please remember to go to the lab department   for your tests - we will call you with the results when they are available.       Please schedule a follow up visit in 3 months but call sooner if needed  Late Add:  Esr and calcium up so change to 20 ceiling/ 10 floor and f/u in 4 weeks

## 2018-11-23 ENCOUNTER — Encounter: Payer: Self-pay | Admitting: Internal Medicine

## 2018-11-23 NOTE — Assessment & Plan Note (Signed)
Positive transbronchial biopsy 02/19/91 by Dr. Unice Cobble  -Daily prednisone since 2007 with ? adrenal insufficiency iatrogenic > off completely Feb 2012 with no recurrence - PFT's 06/26/08 FEV1  1.09 ratio  - PFTs  03/02/10 FEV1  1.06 (42%) ratio 34 and no better p B2,  DLC0 48% corrects to 64% - PFT's 08/01/2011 FEV1 1.6 (42%)  41 ratio  DLCO 55 corrects 77 - PFTs 10/10/16    FEV1  0.87(38)ratio 41 and DLCO 36/39c corrects to 51 for alv vol > placed back on daily pred  - 02/20/2017 decreased pred to 5 mg daily > to 2.1m daily as of 07/17/2017  - 07/17/2017 try 2.5 mg qod > 09/28/2017 changed to 20 mg until better and new floor of 5 mg daily plus consider plaquenil due to new skin involvement on face  - opth eval neg 11/10/17 - Start plaquenil 200 mg daily 11/26/2017  - 01/10/2018 ESR up to 97 with pred taper so rec bid plaquenil > could only tol 200 mg daily  - 02/19/2018 ESR = 94 on 529mdaily  - 05/21/2018 referred to dermatology  - 08/21/2018   ESR = 80  On prednisone 10 mg/ plaquenil 200 mg daily > tapered to floor of 5 mg daily   - 11/22/2018  ESR  = 113 on pred 5 mg and plaqu 200 mg with calcium 12.1 so rec pred 20 mg ub then new floor of 10 mg daily   Clearly labs> hx (which is very non-specific)  suggest sarcoid flare on 5 mg daily. The goal with a chronic steroid dependent illness is always arriving at the lowest effective dose that controls the disease/symptoms and not accepting a set "formula" which is based on statistics or guidelines that don't always take into account patient  variability or the natural hx of the dz in every individual patient, which may well vary over time.  For now therefore I recommend the patient maintain  20 mg until better then 10 mg floor and f/u in 4 weeks

## 2018-11-23 NOTE — Assessment & Plan Note (Signed)
Quit smoking 2005  - PFT's 06/26/08 FEV1 1.09 ratio  - PFTs 03/02/10 FEV1 1.06 ratio 34    PFT's 08/01/2011 FEV1 1.6 (42%)  41 ratio  DLCO 55 corrects 77 - Spiriva trial March 02, 2010 > ? better but not worse off it 04/2011 - 05/12/2015 p extensive coaching HFA effectiveness =    90%  - Referred to Rehab 08/21/2014 > could not arrange due to schedule  -med calendar 06/16/2013 > did not bring to office as requested 12/19/13 or 04/23/14  Or 08/21/2014  - 11/09/2016    changed symb to bevespi since taking chronic pred for pf    - 02/20/2017 decreased pred to 5 mg daily > weaned to 2.5 mg qd as of 07/17/2017  - started rehab mid Aug 2018  - 07/17/2017 changed to pred 2.5 even days > did not tolerate so changed  Around  07/28/17 back to 5 mg qod  - 09/03/2017  After extensive coaching HFA effectiveness =    90% rx stiolto  - Spirometry 09/03/2017  FEV1 0.52 (23%)  Ratio 40 with typical curvature   - PFT's  02/19/2018  FEV1 0.65 (29 % ) ratio 43  p 1 % improvement from saba p stiolto/ pred 5 mg  prior to study with DLCO  39 % corrects to 56  % for alv volume     Clearly severe copd with steroid dep for sarcoid so reasonable to continue lama/laba s ics for now

## 2018-11-23 NOTE — Assessment & Plan Note (Signed)
New start 09/2016 at admit see records - 10/18/16 Patient Saturations on Room Air at Rest = 92% Patient Saturations on Room Air while Ambulating = 84% Patient Saturations on 2 Liters of oxygen while Ambulating = 90% - HCO3  11/09/2016 = 39  - HCO3   03/16/17        = 34  - HC03    01/10/2018  = 40  - HC03  11/22/2018     = 38   As of  11/22/2018  rec 2lpm sleeping and titrate with ambulation to maintain > 90%    Adequate control on present rx, reviewed in detail with pt > no change in rx needed     I had an extended discussion with the patient reviewing all relevant studies completed to date and  lasting 15 to 20 minutes of a 25 minute visit    Each maintenance medication was reviewed in detail including most importantly the difference between maintenance and prns and under what circumstances the prns are to be triggered using an action plan format that is not reflected in the computer generated alphabetically organized AVS but trather by a customized med calendar that reflects the AVS meds with confirmed 100% correlation.   In addition, Please see AVS for unique instructions that I personally wrote and verbalized to the the pt in detail and then reviewed with pt  by my nurse highlighting any  changes in therapy recommended at today's visit to their plan of care.

## 2018-12-10 ENCOUNTER — Telehealth: Payer: Self-pay | Admitting: Internal Medicine

## 2018-12-10 MED ORDER — TIOTROPIUM BROMIDE-OLODATEROL 2.5-2.5 MCG/ACT IN AERS: 2.0000 | INHALATION_SPRAY | Freq: Every day | RESPIRATORY_TRACT | 0 refills | Status: DC

## 2018-12-10 NOTE — Telephone Encounter (Signed)
Called and spoke with patient. I let her know a sample will be at the front desk for pick tomorrow 12/11/18. Patient verbalized understanding.  Nothing further needed.

## 2018-12-12 ENCOUNTER — Telehealth: Payer: Self-pay | Admitting: Internal Medicine

## 2018-12-12 DIAGNOSIS — R053 Chronic cough: Secondary | ICD-10-CM

## 2018-12-12 DIAGNOSIS — J449 Chronic obstructive pulmonary disease, unspecified: Secondary | ICD-10-CM

## 2018-12-12 DIAGNOSIS — R05 Cough: Secondary | ICD-10-CM

## 2018-12-12 NOTE — Telephone Encounter (Signed)
It could be so we need to get dme to provide a mask with the same flow, be sure her 02 is humidified, and refer to [facility] for eval  - also hold aspirin until no active bleeding x 3 days then ok to resume

## 2018-12-12 NOTE — Telephone Encounter (Addendum)
Patient  is okay with  dme referral.And also ENT referral. Nothing further needed at this time

## 2018-12-12 NOTE — Telephone Encounter (Signed)
Spoke with pt. States that she has been having nose bleeds for the past 2-3 nights. Her nose is sore, swollen and has a scab on the left nostril. She doesn't know if this could be related to her oxygen use. Pt is requesting MW's recommendations.  MW - please advise. Thanks.

## 2018-12-16 ENCOUNTER — Telehealth: Payer: Self-pay | Admitting: Internal Medicine

## 2018-12-16 NOTE — Telephone Encounter (Signed)
Called and spoke with Patient. She stated that she had appointment with MW, 12/23/18, but felt that she needed to come in sooner.  Patient is having increased SHOB, and cough.  She denies fever.  She stated that she has been having to use her Albuterol inhaler every 4 hours, with little response.  Appointment is scheduled with Dr Melvyn Novas, 12/17/18, at 2pm.

## 2018-12-17 ENCOUNTER — Ambulatory Visit: Payer: BLUE CROSS/BLUE SHIELD | Admitting: Internal Medicine

## 2018-12-17 ENCOUNTER — Other Ambulatory Visit: Payer: Self-pay

## 2018-12-17 ENCOUNTER — Encounter (HOSPITAL_COMMUNITY): Payer: Self-pay

## 2018-12-17 ENCOUNTER — Emergency Department (HOSPITAL_COMMUNITY): Payer: BLUE CROSS/BLUE SHIELD

## 2018-12-17 ENCOUNTER — Inpatient Hospital Stay (HOSPITAL_COMMUNITY)
Admission: EM | Admit: 2018-12-17 | Discharge: 2018-12-22 | DRG: 190 | Disposition: A | Discharge status: Home / Self Care | Payer: BLUE CROSS/BLUE SHIELD | Attending: Internal Medicine | Admitting: Internal Medicine

## 2018-12-17 DIAGNOSIS — K000 Anodontia: Secondary | ICD-10-CM | POA: Diagnosis present

## 2018-12-17 DIAGNOSIS — Z923 Personal history of irradiation: Secondary | ICD-10-CM

## 2018-12-17 DIAGNOSIS — K219 Gastro-esophageal reflux disease without esophagitis: Secondary | ICD-10-CM | POA: Diagnosis present

## 2018-12-17 DIAGNOSIS — T380X5A Adverse effect of glucocorticoids and synthetic analogues, initial encounter: Secondary | ICD-10-CM | POA: Diagnosis not present

## 2018-12-17 DIAGNOSIS — J189 Pneumonia, unspecified organism: Secondary | ICD-10-CM | POA: Diagnosis present

## 2018-12-17 DIAGNOSIS — Z832 Family history of diseases of the blood and blood-forming organs and certain disorders involving the immune mechanism: Secondary | ICD-10-CM | POA: Diagnosis not present

## 2018-12-17 DIAGNOSIS — Z79811 Long term (current) use of aromatase inhibitors: Secondary | ICD-10-CM

## 2018-12-17 DIAGNOSIS — J441 Chronic obstructive pulmonary disease with (acute) exacerbation: Secondary | ICD-10-CM | POA: Diagnosis present

## 2018-12-17 DIAGNOSIS — Z825 Family history of asthma and other chronic lower respiratory diseases: Secondary | ICD-10-CM | POA: Diagnosis not present

## 2018-12-17 DIAGNOSIS — D869 Sarcoidosis, unspecified: Secondary | ICD-10-CM | POA: Diagnosis present

## 2018-12-17 DIAGNOSIS — Z6829 Body mass index (BMI) 29.0-29.9, adult: Secondary | ICD-10-CM

## 2018-12-17 DIAGNOSIS — Z853 Personal history of malignant neoplasm of breast: Secondary | ICD-10-CM | POA: Diagnosis not present

## 2018-12-17 DIAGNOSIS — D72829 Elevated white blood cell count, unspecified: Secondary | ICD-10-CM | POA: Diagnosis not present

## 2018-12-17 DIAGNOSIS — Z803 Family history of malignant neoplasm of breast: Secondary | ICD-10-CM | POA: Diagnosis not present

## 2018-12-17 DIAGNOSIS — Z7952 Long term (current) use of systemic steroids: Secondary | ICD-10-CM

## 2018-12-17 DIAGNOSIS — J309 Allergic rhinitis, unspecified: Secondary | ICD-10-CM | POA: Diagnosis present

## 2018-12-17 DIAGNOSIS — J9621 Acute and chronic respiratory failure with hypoxia: Secondary | ICD-10-CM | POA: Diagnosis present

## 2018-12-17 DIAGNOSIS — J440 Chronic obstructive pulmonary disease with acute lower respiratory infection: Secondary | ICD-10-CM | POA: Diagnosis not present

## 2018-12-17 DIAGNOSIS — F329 Major depressive disorder, single episode, unspecified: Secondary | ICD-10-CM | POA: Diagnosis present

## 2018-12-17 DIAGNOSIS — D573 Sickle-cell trait: Secondary | ICD-10-CM | POA: Diagnosis present

## 2018-12-17 DIAGNOSIS — I10 Essential (primary) hypertension: Secondary | ICD-10-CM | POA: Diagnosis present

## 2018-12-17 DIAGNOSIS — Z9981 Dependence on supplemental oxygen: Secondary | ICD-10-CM

## 2018-12-17 DIAGNOSIS — N179 Acute kidney failure, unspecified: Secondary | ICD-10-CM | POA: Diagnosis present

## 2018-12-17 DIAGNOSIS — Z87891 Personal history of nicotine dependence: Secondary | ICD-10-CM

## 2018-12-17 DIAGNOSIS — Z885 Allergy status to narcotic agent status: Secondary | ICD-10-CM

## 2018-12-17 DIAGNOSIS — Z8249 Family history of ischemic heart disease and other diseases of the circulatory system: Secondary | ICD-10-CM

## 2018-12-17 DIAGNOSIS — E663 Overweight: Secondary | ICD-10-CM | POA: Diagnosis present

## 2018-12-17 DIAGNOSIS — Z79899 Other long term (current) drug therapy: Secondary | ICD-10-CM

## 2018-12-17 DIAGNOSIS — J9620 Acute and chronic respiratory failure, unspecified whether with hypoxia or hypercapnia: Secondary | ICD-10-CM | POA: Diagnosis present

## 2018-12-17 DIAGNOSIS — Z7982 Long term (current) use of aspirin: Secondary | ICD-10-CM

## 2018-12-17 LAB — CBC WITH DIFFERENTIAL/PLATELET
Abs Immature Granulocytes: 0.06 10*3/uL (ref 0.00–0.07)
Basophils Absolute: 0 10*3/uL (ref 0.0–0.1)
Basophils Relative: 0 %
Eosinophils Absolute: 0.3 10*3/uL (ref 0.0–0.5)
Eosinophils Relative: 4 %
HEMATOCRIT: 31.8 % — AB (ref 36.0–46.0)
Hemoglobin: 9.5 g/dL — ABNORMAL LOW (ref 12.0–15.0)
Immature Granulocytes: 1 %
LYMPHS ABS: 1.7 10*3/uL (ref 0.7–4.0)
Lymphocytes Relative: 18 %
MCH: 26 pg (ref 26.0–34.0)
MCHC: 29.9 g/dL — ABNORMAL LOW (ref 30.0–36.0)
MCV: 86.9 fL (ref 80.0–100.0)
Monocytes Absolute: 0.6 10*3/uL (ref 0.1–1.0)
Monocytes Relative: 6 %
Neutro Abs: 6.9 10*3/uL (ref 1.7–7.7)
Neutrophils Relative %: 71 %
Platelets: 296 10*3/uL (ref 150–400)
RBC: 3.66 MIL/uL — ABNORMAL LOW (ref 3.87–5.11)
RDW: 15.4 % (ref 11.5–15.5)
WBC: 9.6 10*3/uL (ref 4.0–10.5)
nRBC: 0 % (ref 0.0–0.2)

## 2018-12-17 LAB — INFLUENZA PANEL BY PCR (TYPE A & B)
Influenza A By PCR: NEGATIVE
Influenza B By PCR: NEGATIVE

## 2018-12-17 LAB — BASIC METABOLIC PANEL
Anion gap: 8 (ref 5–15)
BUN: 21 mg/dL — AB (ref 6–20)
CHLORIDE: 98 mmol/L (ref 98–111)
CO2: 34 mmol/L — ABNORMAL HIGH (ref 22–32)
Calcium: 10 mg/dL (ref 8.9–10.3)
Creatinine, Ser: 1.42 mg/dL — ABNORMAL HIGH (ref 0.44–1.00)
GFR calc non Af Amer: 40 mL/min — ABNORMAL LOW (ref >60)
GFR, EST AFRICAN AMERICAN: 47 mL/min — AB (ref >60)
Glucose, Bld: 153 mg/dL — ABNORMAL HIGH (ref 70–99)
Potassium: 3.2 mmol/L — ABNORMAL LOW (ref 3.5–5.1)
Sodium: 140 mmol/L (ref 135–145)

## 2018-12-17 MED ORDER — SORBITOL 70 % SOLN: 30.0000 mL | Freq: Every day | Status: DC | PRN

## 2018-12-17 MED ORDER — SODIUM CHLORIDE 0.9 % IV SOLN
1.0000 g | INTRAVENOUS | Status: DC
  Administered 2018-12-18 – 2018-12-21 (×4): 1 g via INTRAVENOUS
  Filled 2018-12-17 (×4): qty 1

## 2018-12-17 MED ORDER — ONDANSETRON HCL 4 MG PO TABS: 4.0000 mg | ORAL_TABLET | Freq: Four times a day (QID) | ORAL | Status: DC | PRN

## 2018-12-17 MED ORDER — ALBUTEROL SULFATE (2.5 MG/3ML) 0.083% IN NEBU
5.0000 mg | INHALATION_SOLUTION | Freq: Once | RESPIRATORY_TRACT | Status: AC
  Administered 2018-12-17: 5 mg via RESPIRATORY_TRACT
  Filled 2018-12-17: qty 6

## 2018-12-17 MED ORDER — ANASTROZOLE 1 MG PO TABS
1.0000 mg | ORAL_TABLET | Freq: Every day | ORAL | Status: DC
  Administered 2018-12-18 – 2018-12-22 (×5): 1 mg via ORAL
  Filled 2018-12-17 (×5): qty 1

## 2018-12-17 MED ORDER — SERTRALINE HCL 50 MG PO TABS
50.0000 mg | ORAL_TABLET | Freq: Every day | ORAL | Status: DC
  Administered 2018-12-18 – 2018-12-22 (×5): 50 mg via ORAL
  Filled 2018-12-17 (×5): qty 1

## 2018-12-17 MED ORDER — ENOXAPARIN SODIUM 40 MG/0.4ML ~~LOC~~ SOLN
40.0000 mg | SUBCUTANEOUS | Status: DC
  Administered 2018-12-17 – 2018-12-21 (×5): 40 mg via SUBCUTANEOUS
  Filled 2018-12-17 (×5): qty 0.4

## 2018-12-17 MED ORDER — ALBUTEROL SULFATE (2.5 MG/3ML) 0.083% IN NEBU: 2.5000 mg | INHALATION_SOLUTION | Freq: Four times a day (QID) | RESPIRATORY_TRACT | Status: DC | PRN

## 2018-12-17 MED ORDER — POLYETHYLENE GLYCOL 3350 17 G PO PACK: 17.0000 g | PACK | Freq: Every day | ORAL | Status: DC | PRN

## 2018-12-17 MED ORDER — PANTOPRAZOLE SODIUM 40 MG PO TBEC
40.0000 mg | DELAYED_RELEASE_TABLET | Freq: Every day | ORAL | Status: DC
  Administered 2018-12-18 – 2018-12-22 (×5): 40 mg via ORAL
  Filled 2018-12-17 (×5): qty 1

## 2018-12-17 MED ORDER — ONDANSETRON HCL 4 MG/2ML IJ SOLN: 4.0000 mg | Freq: Four times a day (QID) | INTRAMUSCULAR | Status: DC | PRN

## 2018-12-17 MED ORDER — IPRATROPIUM-ALBUTEROL 0.5-2.5 (3) MG/3ML IN SOLN
3.0000 mL | Freq: Four times a day (QID) | RESPIRATORY_TRACT | Status: DC
  Administered 2018-12-17: 3 mL via RESPIRATORY_TRACT
  Filled 2018-12-17: qty 3

## 2018-12-17 MED ORDER — ASPIRIN EC 81 MG PO TBEC
81.0000 mg | DELAYED_RELEASE_TABLET | Freq: Every day | ORAL | Status: DC
  Administered 2018-12-18 – 2018-12-22 (×5): 81 mg via ORAL
  Filled 2018-12-17 (×5): qty 1

## 2018-12-17 MED ORDER — METHYLPREDNISOLONE SODIUM SUCC 125 MG IJ SOLR
60.0000 mg | Freq: Two times a day (BID) | INTRAMUSCULAR | Status: DC
  Administered 2018-12-17 – 2018-12-20 (×6): 60 mg via INTRAVENOUS
  Filled 2018-12-17 (×6): qty 2

## 2018-12-17 MED ORDER — SODIUM CHLORIDE 0.9 % IV SOLN
100.0000 mg | Freq: Two times a day (BID) | INTRAVENOUS | Status: AC
  Administered 2018-12-18 – 2018-12-21 (×8): 100 mg via INTRAVENOUS
  Filled 2018-12-17 (×8): qty 100

## 2018-12-17 MED ORDER — POTASSIUM CHLORIDE CRYS ER 20 MEQ PO TBCR
40.0000 meq | EXTENDED_RELEASE_TABLET | Freq: Once | ORAL | Status: AC
  Administered 2018-12-17: 40 meq via ORAL
  Filled 2018-12-17: qty 2

## 2018-12-17 MED ORDER — ATORVASTATIN CALCIUM 20 MG PO TABS
20.0000 mg | ORAL_TABLET | Freq: Every day | ORAL | Status: DC
  Administered 2018-12-18 – 2018-12-22 (×5): 20 mg via ORAL
  Filled 2018-12-17 (×5): qty 1

## 2018-12-17 MED ORDER — SODIUM CHLORIDE 0.9 % IV SOLN
INTRAVENOUS | Status: DC
  Administered 2018-12-17 – 2018-12-20 (×3): via INTRAVENOUS

## 2018-12-17 MED ORDER — SODIUM CHLORIDE 0.9 % IV SOLN: 1.0000 g | Freq: Once | INTRAVENOUS | Status: DC

## 2018-12-17 MED ORDER — FLEET ENEMA 7-19 GM/118ML RE ENEM: 1.0000 | ENEMA | Freq: Once | RECTAL | Status: DC | PRN

## 2018-12-17 MED ORDER — HYDROXYCHLOROQUINE SULFATE 200 MG PO TABS
200.0000 mg | ORAL_TABLET | Freq: Every day | ORAL | Status: DC
  Administered 2018-12-18 – 2018-12-22 (×5): 200 mg via ORAL
  Filled 2018-12-17 (×5): qty 1

## 2018-12-17 MED ORDER — GUAIFENESIN ER 600 MG PO TB12
1200.0000 mg | ORAL_TABLET | Freq: Two times a day (BID) | ORAL | Status: DC
  Administered 2018-12-17 – 2018-12-22 (×10): 1200 mg via ORAL
  Filled 2018-12-17 (×10): qty 2

## 2018-12-17 MED ORDER — SODIUM CHLORIDE 0.9 % IV BOLUS
500.0000 mL | Freq: Once | INTRAVENOUS | Status: AC
  Administered 2018-12-17: 500 mL via INTRAVENOUS

## 2018-12-17 MED ORDER — SENNA 8.6 MG PO TABS
1.0000 | ORAL_TABLET | Freq: Two times a day (BID) | ORAL | Status: DC
  Administered 2018-12-17 – 2018-12-22 (×8): 8.6 mg via ORAL
  Filled 2018-12-17 (×10): qty 1

## 2018-12-17 MED ORDER — SODIUM CHLORIDE 0.9 % IV SOLN
2.0000 g | Freq: Once | INTRAVENOUS | Status: AC
  Administered 2018-12-17: 2 g via INTRAVENOUS
  Filled 2018-12-17: qty 2

## 2018-12-17 MED ORDER — FLUTICASONE PROPIONATE 50 MCG/ACT NA SUSP
2.0000 | Freq: Every day | NASAL | Status: DC | PRN
  Administered 2018-12-18 – 2018-12-19 (×2): 2 via NASAL
  Filled 2018-12-17: qty 16

## 2018-12-17 MED ORDER — VANCOMYCIN HCL IN DEXTROSE 1-5 GM/200ML-% IV SOLN
1000.0000 mg | Freq: Once | INTRAVENOUS | Status: DC
  Filled 2018-12-17: qty 200

## 2018-12-17 MED ORDER — METOPROLOL TARTRATE 25 MG PO TABS
25.0000 mg | ORAL_TABLET | Freq: Two times a day (BID) | ORAL | Status: DC
  Administered 2018-12-17 – 2018-12-21 (×8): 25 mg via ORAL
  Filled 2018-12-17 (×8): qty 1

## 2018-12-17 MED ORDER — SODIUM CHLORIDE 0.9 % IV SOLN: 500.0000 mg | Freq: Once | INTRAVENOUS | Status: DC

## 2018-12-17 MED ORDER — IPRATROPIUM-ALBUTEROL 0.5-2.5 (3) MG/3ML IN SOLN
3.0000 mL | Freq: Four times a day (QID) | RESPIRATORY_TRACT | Status: DC
  Administered 2018-12-18 – 2018-12-19 (×5): 3 mL via RESPIRATORY_TRACT
  Filled 2018-12-17 (×5): qty 3

## 2018-12-17 MED ORDER — GABAPENTIN 300 MG PO CAPS
600.0000 mg | ORAL_CAPSULE | Freq: Every day | ORAL | Status: DC
  Administered 2018-12-17 – 2018-12-21 (×5): 600 mg via ORAL
  Filled 2018-12-17 (×5): qty 2

## 2018-12-17 MED ORDER — SODIUM CHLORIDE 0.9% FLUSH
3.0000 mL | Freq: Two times a day (BID) | INTRAVENOUS | Status: DC
  Administered 2018-12-17 – 2018-12-21 (×6): 3 mL via INTRAVENOUS

## 2018-12-17 NOTE — H&P (Signed)
[facility] History and Physical  KENYADA DOSCH GBE:[id number] DOB: [birth year] DOA: 12/17/2018 Referring physician: ED PCP: Benay Pike, MD  Chief Complaint: Shortness of breath ------------------------------------------------------------------------------------------------------ Assessment/Plan Active Problems:   Acute on chronic respiratory failure ([place])  Multifocal pneumonia - clinically, patient does not look as sick as chest x-ray suggests.  However, she is immunocompromised on chronic steroids and Plaquenil.  She was given IV cefepime and IV vancomycin in the ED.  I would start on IV Rocephin and IV doxycycline.  Monitor WBC and temperature trend.  Acute exacerbation of COPD - secondary to pneumonia.  Has bilateral mild wheezing.  Patient takes 10 mg of prednisone daily at home.  For now, I will switch her to IV Solu-Medrol 60 mg twice daily.  Taper down gradually.  Start on Mucinex, flutter valve, inhalers and duo nebs.  Acute on chronic respiratory with hypoxia -uses 2 L via nasal at home.  Currently going 3 to 4 L.  Wean down as tolerated.  Patient states that she gets more short of breath on ambulation.  Ambulate in the hallway tomorrow and check oxygen dependence.  Sarcoidosis - takes Plaquenil 20 mg daily and prednisone 10 mg daily at home.  Currently on Solu-Medrol.  Continue Plaquenil.  Acute kidney injury - creatinine was normal till February 2019, last evaluated creatinine from 11/22/2018 was elevated to 1.5.  Creatinine today 1.42.  This may still be acute kidney injury.  Will hydrate and reassess.  Hypertension -home meds consist of metoprolol and Maxzide.  Continue metoprolol.  Keep Maxide on hold while getting hydrated.  History of right breast cancer -status post lumpectomy and radiation.  Continue anastrozole.  Depression -sertraline  Denies history of coronary disease.  Home list shows aspirin and statin.  Mobility: Encourage ambulation Diet: Heart  healthy diet DVT prophylaxis: Lovenox Code Status:  Full code Family Communication:  Patient's daughter who is a Marine scientist at [employer] was available at the bedside and participated in the conversation. Disposition Plan:  Anticipate discharge to home in next 2 to 3 days.  ----------------------------------------------------------------------------------------------------- HPI: [name] is a 58 y.o. female who presented to the ED with complaint worsening shortness of breath for last 5 days. Patient has history of COPD on 2 L oxygen via nasal cannula, sarcoidosis on prednisone, history of right breast cancer status post lumpectomy and radiation in 2016, hypertension. 5 days ago, patient started experiencing shortness of breath.  It progressively worsened and patient is now not able even to walk to the bathroom.  She also has been waking up more phlegm than before.  No fever.  No chest pain.  No dizziness. No history of cardiac disease.  No history of blood clots. Quit smoking 15 years ago. Pulmonologist Dr. Melvyn Novas  ED Course: In the ED, patient was afebrile, heart rate mostly in 90s, on 3 to 4 L oxygen by nasal cannula to maintain oxygen saturation more than 90%. Labs showed CBC normal at 9.6, hemoglobin 9.5, platelet 296, potassium low at 3.2, serum bicarb 34, serum creatinine elevated to 1.52. Chest x-ray showed multifocal pneumonia. Areas of scattered scarring bilaterally.   EKG: Personally reviewed by me which shows normal sinus rhythm at 90 bpm, biatrial enlargement, no significant ST-T wave changes, QTC 453 ms.  Review of Systems:  All systems were reviewed and were negative unless otherwise mentioned in the HPI  Past Medical History:  Diagnosis Date  . Acute on chronic respiratory failure ([place]) 10/09/2016  . Allergic rhinitis   .  Breast cancer ([place]) 2016   right breast  . COPD, mild ([place]) FOLLOWED BY DR Melvyn Novas  . GERD (gastroesophageal reflux disease)   . HTN (hypertension)   .  Hypertension   . Iron deficiency anemia   . Long-term current use of steroids SYMBICORT INHALER  . 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 Overweight (BMI 25.0-29.9) 05/11/2016  . Personal history of radiation therapy 2016  . Sarcoidosis STABLE PER CXR JUNE 2013  . Shortness of breath   . Sickle cell trait [facility])    Past Surgical History:  Procedure Laterality Date  . ADJUSTABLE SUTURE MANIPULATION  05/22/2012   Procedure: ADJUSTABLE SUTURE MANIPULATION;  Surgeon: Dara Hoyer, MD;  Location: [facility];  Service: Ophthalmology;  Laterality: Right;  . BREAST LUMPECTOMY Right 2016  . CESAREAN SECTION  1986   W/ BILATERAL TUBAL LIGATION  . EYE SURGERY    . MEDIAN RECTUS REPAIR  05/22/2012   Procedure: MEDIAN RECTUS REPAIR;  Surgeon: Dara Hoyer, MD;  Location: [facility];  Service: Ophthalmology;  Laterality: Bilateral;  INFERIOR RECTUS RESECTION WITH ADJUSTIBLE SUTURES RIGHT EYE   . RADIOACTIVE SEED GUIDED PARTIAL MASTECTOMY WITH AXILLARY SENTINEL LYMPH NODE BIOPSY Right 08/18/2015   Procedure: RADIOACTIVE SEED GUIDED PARTIAL MASTECTOMY WITH AXILLARY SENTINEL LYMPH NODE BIOPSY;  Surgeon: Stark Klein, MD;  Location: [place];  Service: General;  Laterality: Right;  . TOTAL ROBOTIC ASSISTED LAPAROSCOPIC HYSTERECTOMY  12-30-2010   SYMPTOMATIC UTERINE FIBROIDS  . UPPER TEETH EXTRACTION'S  1992    Social History:  reports that she quit smoking about 15 years ago. She has a 20.00 pack-year smoking history. She has never used smokeless tobacco. She reports that she does not drink alcohol or use drugs.  Allergies  Allergen Reactions  . Codeine Other (See Comments)    Avoids-felt bad when took before    Family History  Problem Relation Age of Onset  . Hyperlipidemia Mother   . Asthma Mother   . Lupus Mother   . Breast cancer Mother 25  . Hypertension Father   . Hyperlipidemia Father   . Hypertension Sister   . Hypertension Brother   . Cancer  Neg Hx   . Diabetes Neg Hx   . Coronary artery disease Neg Hx      Prior to Admission medications   Medication Sig Start Date End Date Taking? Authorizing Provider  albuterol (VENTOLIN HFA) 108 (90 Base) MCG/ACT inhaler Inhale 2 puffs into the lungs every 6 (six) hours as needed for wheezing or shortness of breath. 05/21/18  Yes Tanda Rockers, MD  anastrozole (ARIMIDEX) 1 MG tablet Take 1 tablet (1 mg total) by mouth daily. 02/06/18  Yes Nicholas Lose, MD  aspirin EC 81 MG tablet Take 81 mg by mouth daily.   Yes [provider]  atorvastatin (LIPITOR) 20 MG tablet Take 1 tablet (20 mg total) by mouth daily. 01/29/18  Yes Nicolette Bang, DO  erythromycin ophthalmic ointment Place 1 application into both eyes at bedtime.   Yes [provider]  fluticasone (FLONASE) 50 MCG/ACT nasal spray Place 2 sprays into both nostrils daily as needed for allergies. 08/06/18  Yes Benay Pike, MD  gabapentin (NEURONTIN) 300 MG capsule Take 2 capsules (600 mg total) by mouth at bedtime. 2 at bedtime 02/06/18  Yes Nicholas Lose, MD  Guaifenesin (MUCINEX MAXIMUM STRENGTH) 1200 MG TB12 Take 1,200 mg by mouth 2 (two) times daily as needed.   Yes [provider]  hydroxychloroquine (PLAQUENIL) 200 MG tablet Take 1 tablet (200 mg total) by mouth daily. 11/26/17  Yes Tanda Rockers, MD  metoprolol tartrate (LOPRESSOR) 25 MG tablet Take 1 tablet (25 mg total) by mouth 2 (two) times daily. 06/07/18  Yes Benay Pike, MD  Multiple Vitamin (MULTIVITAMIN) capsule Take 1 capsule by mouth daily.   Yes [provider]  omeprazole (PRILOSEC) 20 MG capsule TAKE 1 CAPSULE BY MOUTH ONCE DAILY 10/12/18  Yes Donnamae Jude, MD  OXYGEN 2lpm with rest and 3-4 lpm with exertion   Yes [provider]  predniSONE (DELTASONE) 5 MG tablet Take 1 tablet (5 mg total) by mouth daily with breakfast. As directed Patient taking differently: Take 10 mg by mouth daily with breakfast. As  directed 07/16/18  Yes Tanda Rockers, MD  sertraline (ZOLOFT) 50 MG tablet Take 1 tablet (50 mg total) by mouth daily. 05/22/18  Yes Benay Pike, MD  STIOLTO RESPIMAT 2.5-2.5 MCG/ACT AERS INHALE 2 PUFFS INTO THE LUNGS DAILY Patient taking differently: Inhale 2 puffs into the lungs daily.  08/19/18  Yes Tanda Rockers, MD  triamterene-hydrochlorothiazide (MAXZIDE-25) 37.5-25 MG tablet TAKE 1 TABLET BY MOUTH ONCE DAILY 01/18/18  Yes Tanda Rockers, MD  Tiotropium Bromide-Olodaterol (STIOLTO RESPIMAT) 2.5-2.5 MCG/ACT AERS Inhale 2 puffs into the lungs daily. Patient not taking: Reported on 12/17/2018 12/10/18   Tanda Rockers, MD    Physical Exam: Vitals:   12/17/18 1600 12/17/18 1621 12/17/18 1630 12/17/18 1700  BP: 124/77 124/77 139/78 138/80  Pulse: 80 85 85 95  Resp: 15 18 13 17   Temp:      SpO2: 97% 96% 96% 95%  Weight:      Height:       Wt Readings from Last 3 Encounters:  12/17/18 81.6 kg  11/22/18 84.8 kg  08/30/18 88.9 kg   Body mass index is 29.95 kg/m.  General: Pleasant middle-aged African-American female, propped up in bed.  Not in distress at rest. HENT: Normocephalic, pupils equally reacting to light and accommodation.  No scleral pallor or icterus noted. Oral mucosa is moist.  Chest: Mild end expiratory wheezing bilaterally.  No crackles CVS: S1 &S2 heard. No murmur.  Regular rate and rhythm. Abdomen: Soft, nontender, nondistended.  Bowel sounds are heard.  Liver is not palpable, no abdominal mass palpated Extremities: No cyanosis, clubbing or edema.  Peripheral pulses are palpable. Psych: Alert, awake and oriented, normal mood CNS: Alert, awake more x3 Skin: Warm and dry.  No rashes noted.  Labs on Admission:   CBC: Recent Labs  Lab 12/17/18 1352  WBC 9.6  NEUTROABS 6.9  HGB 9.5*  HCT 31.8*  MCV 86.9  PLT 347    Basic Metabolic Panel: Recent Labs  Lab 12/17/18 1352  NA 140  K 3.2*  CL 98  CO2 34*  GLUCOSE 153*  BUN 21*  CREATININE 1.42*    CALCIUM 10.0    Liver Function Tests: No results for input(s): AST, ALT, ALKPHOS, BILITOT, PROT, ALBUMIN in the last 168 hours. No results for input(s): LIPASE, AMYLASE in the last 168 hours. No results for input(s): AMMONIA in the last 168 hours.  Cardiac Enzymes: No results for input(s): CKTOTAL, CKMB, CKMBINDEX, TROPONINI in the last 168 hours.  BNP (last 3 results) No results for input(s): BNP in the last 8760 hours.  ProBNP (last 3 results) No results for input(s): PROBNP in the last 8760 hours.  CBG: No results for input(s): GLUCAP in the last  168 hours.  Lipase     Component Value Date/Time   LIPASE 29 04/17/2015 1611     Urinalysis    Component Value Date/Time   COLORURINE YELLOW 10/06/2016 1423   APPEARANCEUR CLEAR 10/06/2016 1423   LABSPEC 1.011 10/06/2016 1423   PHURINE 6.0 10/06/2016 1423   GLUCOSEU NEGATIVE 10/06/2016 1423   HGBUR TRACE (A) 10/06/2016 1423   HGBUR small 05/13/2010 1505   BILIRUBINUR NEGATIVE 10/06/2016 1423   BILIRUBINUR NEG 12/29/2015 1444   KETONESUR NEGATIVE 10/06/2016 1423   PROTEINUR NEGATIVE 10/06/2016 1423   UROBILINOGEN 0.2 12/29/2015 1444   UROBILINOGEN 0.2 04/17/2015 1618   NITRITE NEGATIVE 10/06/2016 1423   LEUKOCYTESUR TRACE (A) 10/06/2016 1423     Drugs of Abuse  No results found for: LABOPIA, COCAINSCRNUR, LABBENZ, AMPHETMU, THCU, LABBARB    Radiological Exams on Admission: Dg Chest 2 View  Result Date: 12/17/2018 CLINICAL DATA:  Cough and shortness of breath EXAM: CHEST - 2 VIEW COMPARISON:  September 07, 2017 FINDINGS: There is patchy infiltrate in portions of the lingula and right middle lobe. There is also patchy infiltrate in the posterior segment right upper lobe. There are scattered areas of scarring elsewhere. Heart size and pulmonary vascular normal. No adenopathy. There are surgical clips in the right lateral breast region. IMPRESSION: Multifocal pneumonia. Areas of scattered scarring bilaterally. Heart size  normal. No adenopathy evident. Electronically Signed   By: Lowella Grip III M.D.   On: 12/17/2018 14:34   Severity of Illness: The appropriate patient status for this patient is INPATIENT. Inpatient status is judged to be reasonable and necessary in order to provide the required intensity of service to ensure the patient's safety. The patient's presenting symptoms, physical exam findings, and initial radiographic and laboratory data in the context of their chronic comorbidities is felt to place them at high risk for further clinical deterioration. Furthermore, it is not anticipated that the patient will be medically stable for discharge from the hospital within 2 midnights of admission. The following factors support the patient status of inpatient.   " The patient's presenting symptoms include shortness of breath. " The worrisome physical exam findings include bilateral wheezing. " The initial radiographic and laboratory data are worrisome because of multifocal pneumonia on chest x-ray. " The chronic co-morbidities include sarcoidosis, immunocompromised status.   * I certify that at the point of admission it is my clinical judgment that the patient will require inpatient hospital care spanning beyond 2 midnights from the point of admission due to high intensity of service, high risk for further deterioration and high frequency of surveillance required.* Signed, Terrilee Croak, MD [facility] 12/17/2018

## 2018-12-17 NOTE — Progress Notes (Signed)
Call to ED to attempt to get report on patient; RN "can't talk to you right now". He will call me back as soon as he is able to give report. Donne Hazel, RN

## 2018-12-17 NOTE — Progress Notes (Signed)
A consult was received from an ED physician for vancomycin and cefepime per pharmacy dosing.  The patient's profile has been reviewed for ht/wt/allergies/indication/available labs.   A one time order has been placed for vancomycin 1000 mg and cefepime 2 g iv once.  Further antibiotics/pharmacy consults should be ordered by admitting physician if indicated.                       Thank you, Napoleon Form 12/17/2018  3:45 PM

## 2018-12-17 NOTE — ED Provider Notes (Signed)
[name] Provider Note   CSN: [id number] Arrival date & time: 12/17/18  1305     History   Chief Complaint Chief Complaint  Patient presents with  . Shortness of Breath    HPI [name] is a 58 y.o. female.  HPI  Patient is a 58 year old female with a history of breast cancer, COPD, hypertension, sarcoidosis, sickle cell trait, who presents to the emergency department today for evaluation of shortness of breath.  Patient states that 5 days ago she woke up feeling short of breath.  Symptoms were accompanied by productive cough with green/yellow sputum, wheezing, sore throat, bilateral ear pain, rhinorrhea and congestion.  Also complaining of body aches.  She denies chest pain or fevers.  She denies bilateral lower extremity swelling.  States she did receive her flu shot this year.  She follows with Dr. Melvyn Novas with pulmonology for her sarcoid.  She is on chronic 2 L O2 at home however had to increase to 3 L 5 days ago.  EMS noted patient to be satting 84% on room air.  Respiratory rate elevated at 24, heart rate elevated 100, blood pressure normal at 110/61.  They state that patient try to use her albuterol inhaler at home without relief.  They gave 10 mg albuterol, 1 of Atrovent and 125 Solu-Medrol in route. She states she feels somewhat improved after treatment from EMS.   Past Medical History:  Diagnosis Date  . Acute on chronic respiratory failure ([place]) 10/09/2016  . Allergic rhinitis   . Breast cancer ([place]) 2016   right breast  . COPD, mild ([place]) FOLLOWED BY DR Melvyn Novas  . GERD (gastroesophageal reflux disease)   . HTN (hypertension)   . Hypertension   . Iron deficiency anemia   . Long-term current use of steroids SYMBICORT INHALER  . 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 Overweight (BMI 25.0-29.9) 05/11/2016  . Personal history of radiation therapy 2016  . Sarcoidosis STABLE PER CXR JUNE 2013  . Shortness of breath   . Sickle cell trait [facility])      Patient Active Problem List   Diagnosis Date Noted  . Acute on chronic respiratory failure ([place]) 12/17/2018  . Trichimoniasis 02/21/2018  . Chronic cough 11/26/2017  . Depression   . Obesity (BMI 30-39.9) 01/10/2017  . Dyspnea on exertion 11/09/2016  . Chronic respiratory failure with hypoxia and hypercapnia ([place]) 10/29/2016  . Depression with anxiety 08/07/2016  . Breast cancer of upper-outer quadrant of right female breast ([place]) 08/04/2015  . Vaginal discharge 05/08/2013  . GERD (gastroesophageal reflux disease) 12/21/2012  . Hyperlipidemia 12/21/2012  . SICKLE-CELL TRAIT 04/15/2008  . Sarcoidosis ([place]) 01/10/2007  . HYPERTENSION, BENIGN SYSTEMIC 01/10/2007  . 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 ADJUSTABLE SUTURE MANIPULATION  05/22/2012   Procedure: ADJUSTABLE SUTURE MANIPULATION;  Surgeon: Dara Hoyer, MD;  Location: [facility];  Service: Ophthalmology;  Laterality: Right;  . BREAST LUMPECTOMY Right 2016  . CESAREAN SECTION  1986   W/ BILATERAL TUBAL LIGATION  . EYE SURGERY    . MEDIAN RECTUS REPAIR  05/22/2012   Procedure: MEDIAN RECTUS REPAIR;  Surgeon: Dara Hoyer, MD;  Location: [facility];  Service: Ophthalmology;  Laterality: Bilateral;  INFERIOR RECTUS RESECTION WITH ADJUSTIBLE SUTURES RIGHT EYE   . RADIOACTIVE SEED GUIDED PARTIAL MASTECTOMY WITH AXILLARY SENTINEL LYMPH NODE BIOPSY Right 08/18/2015  Procedure: RADIOACTIVE SEED GUIDED PARTIAL MASTECTOMY WITH AXILLARY SENTINEL LYMPH NODE BIOPSY;  Surgeon: Stark Klein, MD;  Location: [facility];  Service: General;  Laterality: Right;  . TOTAL ROBOTIC ASSISTED LAPAROSCOPIC HYSTERECTOMY  12-30-2010   SYMPTOMATIC UTERINE FIBROIDS  . UPPER TEETH EXTRACTION'S  1992     OB History   No obstetric history on file.      Home Medications    Prior to Admission  medications   Medication Sig Start Date End Date Taking? Authorizing Provider  albuterol (VENTOLIN HFA) 108 (90 Base) MCG/ACT inhaler Inhale 2 puffs into the lungs every 6 (six) hours as needed for wheezing or shortness of breath. 05/21/18  Yes Tanda Rockers, MD  anastrozole (ARIMIDEX) 1 MG tablet Take 1 tablet (1 mg total) by mouth daily. 02/06/18  Yes Nicholas Lose, MD  aspirin EC 81 MG tablet Take 81 mg by mouth daily.   Yes [provider]  atorvastatin (LIPITOR) 20 MG tablet Take 1 tablet (20 mg total) by mouth daily. 01/29/18  Yes Nicolette Bang, DO  erythromycin ophthalmic ointment Place 1 application into both eyes at bedtime.   Yes [provider]  fluticasone (FLONASE) 50 MCG/ACT nasal spray Place 2 sprays into both nostrils daily as needed for allergies. 08/06/18  Yes Benay Pike, MD  gabapentin (NEURONTIN) 300 MG capsule Take 2 capsules (600 mg total) by mouth at bedtime. 2 at bedtime 02/06/18  Yes Nicholas Lose, MD  Guaifenesin (MUCINEX MAXIMUM STRENGTH) 1200 MG TB12 Take 1,200 mg by mouth 2 (two) times daily as needed.   Yes [provider]  hydroxychloroquine (PLAQUENIL) 200 MG tablet Take 1 tablet (200 mg total) by mouth daily. 11/26/17  Yes Tanda Rockers, MD  metoprolol tartrate (LOPRESSOR) 25 MG tablet Take 1 tablet (25 mg total) by mouth 2 (two) times daily. 06/07/18  Yes Benay Pike, MD  Multiple Vitamin (MULTIVITAMIN) capsule Take 1 capsule by mouth daily.   Yes [provider]  omeprazole (PRILOSEC) 20 MG capsule TAKE 1 CAPSULE BY MOUTH ONCE DAILY 10/12/18  Yes Donnamae Jude, MD  OXYGEN 2lpm with rest and 3-4 lpm with exertion   Yes [provider]  predniSONE (DELTASONE) 5 MG tablet Take 1 tablet (5 mg total) by mouth daily with breakfast. As directed Patient taking differently: Take 10 mg by mouth daily with breakfast. As directed 07/16/18  Yes Tanda Rockers, MD  sertraline (ZOLOFT) 50 MG tablet Take 1 tablet (50 mg  total) by mouth daily. 05/22/18  Yes Benay Pike, MD  STIOLTO RESPIMAT 2.5-2.5 MCG/ACT AERS INHALE 2 PUFFS INTO THE LUNGS DAILY Patient taking differently: Inhale 2 puffs into the lungs daily.  08/19/18  Yes Tanda Rockers, MD  triamterene-hydrochlorothiazide (MAXZIDE-25) 37.5-25 MG tablet TAKE 1 TABLET BY MOUTH ONCE DAILY 01/18/18  Yes Tanda Rockers, MD  Tiotropium Bromide-Olodaterol (STIOLTO RESPIMAT) 2.5-2.5 MCG/ACT AERS Inhale 2 puffs into the lungs daily. Patient not taking: Reported on 12/17/2018 12/10/18   Tanda Rockers, MD    Family History Family History  Problem Relation Age of Onset  . Hyperlipidemia Mother   . Asthma Mother   . Lupus Mother   . Breast cancer Mother 8  . Hypertension Father   . Hyperlipidemia Father   . Hypertension Sister   . Hypertension Brother   . Cancer Neg Hx   . Diabetes Neg Hx   . Coronary artery disease Neg Hx     Social History Social History  Tobacco Use  . Smoking status: Former Smoker    Packs/day: 1.00    Years: 20.00    Pack years: 20.00    Last attempt to quit: 11/14/2003    Years since quitting: 15.1  . Smokeless tobacco: Never Used  Substance Use Topics  . Alcohol use: No  . Drug use: No     Allergies   Codeine   Review of Systems Review of Systems  Constitutional: Negative for chills and fever.  HENT: Positive for congestion, ear pain, rhinorrhea and sore throat.   Eyes: Negative for visual disturbance.  Respiratory: Positive for cough, shortness of breath and wheezing.   Cardiovascular: Negative for chest pain and leg swelling.  Gastrointestinal: Negative for abdominal pain, constipation, diarrhea, nausea and vomiting.  Genitourinary: Negative for flank pain.  Musculoskeletal: Positive for myalgias.  Skin: Negative for rash.  Neurological: Negative for headaches.  All other systems reviewed and are negative.    Physical Exam Updated Vital Signs BP 124/77   Pulse 85   Temp 98.4 F (36.9 C)   Resp 18    Ht 5\' 5"  (1.651 m)   Wt 81.6 kg   SpO2 96%   BMI 29.95 kg/m   Physical Exam Vitals signs and nursing note reviewed.  Constitutional:      General: She is not in acute distress.    Appearance: She is well-developed.  HENT:     Head: Normocephalic and atraumatic.     Right Ear: Tympanic membrane normal.     Left Ear: Tympanic membrane normal.     Nose: Nose normal.     Mouth/Throat:     Mouth: Mucous membranes are moist.     Pharynx: No pharyngeal swelling, oropharyngeal exudate or posterior oropharyngeal erythema.  Eyes:     Conjunctiva/sclera: Conjunctivae normal.  Neck:     Musculoskeletal: Neck supple.  Cardiovascular:     Rate and Rhythm: Normal rate and regular rhythm.     Heart sounds: No murmur.  Pulmonary:     Effort: Pulmonary effort is normal. Tachypnea present. No respiratory distress.     Breath sounds: Wheezing present. No decreased breath sounds, rhonchi or rales.     Comments: Speaking in full sentences. Currently receiving nebulizer tx. Abdominal:     General: Bowel sounds are normal.     Palpations: Abdomen is soft.     Tenderness: There is no abdominal tenderness. There is no guarding or rebound.  Musculoskeletal:        General: No tenderness.     Right lower leg: No edema.     Left lower leg: No edema.  Skin:    General: Skin is warm and dry.  Neurological:     Mental Status: She is alert.  Psychiatric:        Mood and Affect: Mood normal.      ED Treatments / Results  Labs (all labs ordered are listed, but only abnormal results are displayed) Labs Reviewed  CBC WITH DIFFERENTIAL/PLATELET - Abnormal; Notable for the following components:      Result Value   RBC 3.66 (*)    Hemoglobin 9.5 (*)    HCT 31.8 (*)    MCHC 29.9 (*)    All other components within normal limits  BASIC METABOLIC PANEL - Abnormal; Notable for the following components:   Potassium 3.2 (*)    CO2 34 (*)    Glucose, Bld 153 (*)    BUN 21 (*)    Creatinine, Ser 1.42  (*)  GFR calc non Af Amer 40 (*)    GFR calc Af Amer 47 (*)    All other components within normal limits  CULTURE, BLOOD (ROUTINE X 2)  CULTURE, BLOOD (ROUTINE X 2)  INFLUENZA PANEL BY PCR (TYPE A & B)    EKG EKG Interpretation  Date/Time:  Tuesday December 17 2018 13:47:50 EST Ventricular Rate:  90 PR Interval:    QRS Duration: 97 QT Interval:  370 QTC Calculation: 453 R Axis:   66 Text Interpretation:  Sinus rhythm LAE, consider biatrial enlargement RSR' in V1 or V2, right VCD or RVH Baseline wander in lead(s) V1 Confirmed by Dene Gentry [phone]) on 12/17/2018 3:04:54 PM   Radiology Dg Chest 2 View  Result Date: 12/17/2018 CLINICAL DATA:  Cough and shortness of breath EXAM: CHEST - 2 VIEW COMPARISON:  September 07, 2017 FINDINGS: There is patchy infiltrate in portions of the lingula and right middle lobe. There is also patchy infiltrate in the posterior segment right upper lobe. There are scattered areas of scarring elsewhere. Heart size and pulmonary vascular normal. No adenopathy. There are surgical clips in the right lateral breast region. IMPRESSION: Multifocal pneumonia. Areas of scattered scarring bilaterally. Heart size normal. No adenopathy evident. Electronically Signed   By: Lowella Grip III M.D.   On: 12/17/2018 14:34    Procedures Procedures (including critical care time)  Medications Ordered in ED Medications  ceFEPIme (MAXIPIME) 2 g in sodium chloride 0.9 % 100 mL IVPB (2 g Intravenous New Bag/Given 12/17/18 1619)  vancomycin (VANCOCIN) IVPB 1000 mg/200 mL premix (has no administration in time range)  sodium chloride 0.9 % bolus 500 mL (0 mLs Intravenous Stopped 12/17/18 1543)  albuterol (PROVENTIL) (2.5 MG/3ML) 0.083% nebulizer solution 5 mg (5 mg Nebulization Given 12/17/18 1401)  potassium chloride SA (K-DUR,KLOR-CON) CR tablet 40 mEq (40 mEq Oral Given 12/17/18 1619)     Initial Impression / Assessment and Plan / ED Course  I have reviewed the triage vital  signs and the nursing notes.  Pertinent labs & imaging results that were available during my care of the patient were reviewed by me and considered in my medical decision making (see chart for details).     Final Clinical Impressions(s) / ED Diagnoses   Final diagnoses:  Multifocal pneumonia   Pt with a h/o sarcoidosis and COPD presenting to the ED with cough, sob, wheezing, sore throat, rhinorrhea, and congestion for the last 5 days. No fevers.  Arrived via ems and received 125 solumedrol, 10 albuterol, and 1 Atrovent en route.  On exam still has diffuse wheezing. Is mildly  tachypneic but able to converse in full sentences. No evidence of peripheral edema on exam.  Will obtain CXR and EKG. Will obtain flu swab and labs.  CBC without leukocytosis, anemia appears stable.  BMP with elevated co2 at 34, improved from 3 weeks ago. Elevated BUN and creatinine at 21 and 1.42 respectively. Creatinine improving from 1 week ago.   Influenza pending at time of admission.   CXR Multifocal pneumonia. Areas of scattered scarring bilaterally. Heart size normal. No adenopathy evident.  EKG with NSR. LAE, consider biatrial enlargement RSR' in V1 or V2, right VCD or RVH Baseline wander in lead(s) V1.  Given findings of multifocal pneumonia, increased oxygen requirement, new/worsening cough/wheezing will plan for admission for further tx. Broad spectrum abx given to cover multifocal pneumonia especially in setting of chronic steroid use and immunocompromised state.  3:45 PM Discussed case with Dr. Pietro Cassis who accepts  patient for admission.   ED Discharge Orders    None       Bishop [place] 12/17/18 1626    Valarie Merino, MD 12/21/18 [phone]

## 2018-12-17 NOTE — ED Triage Notes (Signed)
EMS reports from home, SOB and productive cough x 3 days. Took Albuterol inhaler at home with no relief. Hx of COPD.  BP 110/61 RR 24 HR 100 SP02 84 RA  10mg  Albuterol  1mg  Atrovent 125mg  Solumedrol Enroute  20ga LAC

## 2018-12-17 NOTE — ED Notes (Signed)
Bed: WA01 Expected date:  Expected time:  Means of arrival:  Comments: EMS-SOB 

## 2018-12-18 DIAGNOSIS — D869 Sarcoidosis, unspecified: Secondary | ICD-10-CM

## 2018-12-18 DIAGNOSIS — J9621 Acute and chronic respiratory failure with hypoxia: Secondary | ICD-10-CM

## 2018-12-18 LAB — CBC
HCT: 28.6 % — ABNORMAL LOW (ref 36.0–46.0)
Hemoglobin: 8.6 g/dL — ABNORMAL LOW (ref 12.0–15.0)
MCH: 26.3 pg (ref 26.0–34.0)
MCHC: 30.1 g/dL (ref 30.0–36.0)
MCV: 87.5 fL (ref 80.0–100.0)
Platelets: 238 10*3/uL (ref 150–400)
RBC: 3.27 MIL/uL — ABNORMAL LOW (ref 3.87–5.11)
RDW: 15.6 % — ABNORMAL HIGH (ref 11.5–15.5)
WBC: 9.8 10*3/uL (ref 4.0–10.5)
nRBC: 0 % (ref 0.0–0.2)

## 2018-12-18 LAB — BASIC METABOLIC PANEL
Anion gap: 7 (ref 5–15)
BUN: 23 mg/dL — ABNORMAL HIGH (ref 6–20)
CO2: 29 mmol/L (ref 22–32)
Calcium: 9.6 mg/dL (ref 8.9–10.3)
Chloride: 105 mmol/L (ref 98–111)
Creatinine, Ser: 1.27 mg/dL — ABNORMAL HIGH (ref 0.44–1.00)
GFR calc Af Amer: 53 mL/min — ABNORMAL LOW (ref >60)
GFR calc non Af Amer: 46 mL/min — ABNORMAL LOW (ref >60)
Glucose, Bld: 152 mg/dL — ABNORMAL HIGH (ref 70–99)
Potassium: 4.7 mmol/L (ref 3.5–5.1)
Sodium: 141 mmol/L (ref 135–145)

## 2018-12-18 NOTE — Care Management Note (Signed)
Case Management Note  Patient Details  Name: [name] MRN: [id number] Date of Birth: May 20, 1961  Subjective/Objective:Acute on chronic resp failure. From home. CM referral-affording meds. Patient has pcp,pharmacy,has applied for medicaid,states she will get medicare end of the month, &health insurance also. Concerns about transp to pick up meds-informed of mail order @ local pharmacy,once she has medicare-[place] service is available to apply for. Conerned about affording [facility] program available to use if needed-she will wait & see if she is d/c on that med then decide if she wants to use the service-informed of 1x/use/12 calendar months/select pharmacies/$3 co pay. Patient voiced understanding.                   Action/Plan: Dc plan home.  Expected Discharge Date:  (unknown)               Expected Discharge Plan:  Home/Self Care  In-House Referral:     Discharge planning Services  CM Consult  Post Acute Care Choice:  Durable Medical Equipment(inogen 02-travel tank) Choice offered to:     DME Arranged:    DME Agency:     HH Arranged:    HH Agency:     Status of Service:  In process, will continue to follow  If discussed at Long Length of Stay Meetings, dates discussed:    Additional Comments:  Dessa Phi, RN 12/18/2018, 10:34 AM

## 2018-12-18 NOTE — Progress Notes (Signed)
[facility] PROGRESS NOTE  [name] ALP:[id number] DOB: [birth year] DOA: 12/17/2018  PCP: Benay Pike, MD  Brief History/Interval Summary: 58 y.o. female who presented to the ED with complaint worsening shortness of breath for 5 days. Patient has history of COPD on 2 L oxygen via nasal cannula, sarcoidosis on prednisone, history of right breast cancer status post lumpectomy and radiation in 2016, hypertension.  Evaluation in the emergency department revealed concern for multifocal pneumonia.  She was also thought to have an element of COPD exacerbation.  She was hospitalized for further management.  Reason for Visit: Multifocal pneumonia.  COPD exacerbation.  Consultants: None  Procedures: None  Antibiotics: Ceftriaxone and doxycycline  Subjective/Interval History: Patient states that she continues to have a cough with greenish-yellow expectoration.  Continues to have wheezing.  Feels short of breath.  ROS: Denies any nausea or vomiting  Objective:  Vital Signs  Vitals:   12/17/18 2353 12/18/18 0625 12/18/18 0754 12/18/18 1250  BP: 131/69 (!) 162/87  (!) 162/84  Pulse: 75 77  73  Resp:  18  18  Temp:  98.6 F (37 C)  98.8 F (37.1 C)  TempSrc:  Oral  Oral  SpO2:  96% 97%   Weight:      Height:        Intake/Output Summary (Last 24 hours) at 12/18/2018 1302 Last data filed at 12/18/2018 1250 Gross per 24 hour  Intake 2693.62 ml  Output 2050 ml  Net 643.62 ml   Filed Weights   12/17/18 1314  Weight: 81.6 kg    General appearance: alert, cooperative, appears stated age and no distress Head: Normocephalic, without obvious abnormality, atraumatic Resp: Tachypneic.  End expiratory wheezing heard bilaterally.  Few rhonchi.  Crackles.  No use of accessory muscles. Cardio: regular rate and rhythm, S1, S2 normal, no murmur, click, rub or gallop GI: soft, non-tender; bowel sounds normal; no masses,  no organomegaly Extremities: extremities normal,  atraumatic, no cyanosis or edema Pulses: 2+ and symmetric Neurologic: No focal neurological deficits.  She is alert and oriented x3.  Lab Results:  Data Reviewed: I have personally reviewed following labs and imaging studies  CBC: Recent Labs  Lab 12/17/18 1352 12/18/18 0507  WBC 9.6 9.8  NEUTROABS 6.9  --   HGB 9.5* 8.6*  HCT 31.8* 28.6*  MCV 86.9 87.5  PLT 296 353    Basic Metabolic Panel: Recent Labs  Lab 12/17/18 1352 12/18/18 0507  NA 140 141  K 3.2* 4.7  CL 98 105  CO2 34* 29  GLUCOSE 153* 152*  BUN 21* 23*  CREATININE 1.42* 1.27*  CALCIUM 10.0 9.6    GFR: Estimated Creatinine Clearance: 50.3 mL/min (A) (by C-G formula based on SCr of 1.27 mg/dL (H)).    Radiology Studies: Dg Chest 2 View  Result Date: 12/17/2018 CLINICAL DATA:  Cough and shortness of breath EXAM: CHEST - 2 VIEW COMPARISON:  September 07, 2017 FINDINGS: There is patchy infiltrate in portions of the lingula and right middle lobe. There is also patchy infiltrate in the posterior segment right upper lobe. There are scattered areas of scarring elsewhere. Heart size and pulmonary vascular normal. No adenopathy. There are surgical clips in the right lateral breast region. IMPRESSION: Multifocal pneumonia. Areas of scattered scarring bilaterally. Heart size normal. No adenopathy evident. Electronically Signed   By: Lowella Grip III M.D.   On: 12/17/2018 14:34     Medications:  Scheduled: . anastrozole  1 mg Oral Daily  .  aspirin EC  81 mg Oral Daily  . atorvastatin  20 mg Oral Daily  . enoxaparin (LOVENOX) injection  40 mg Subcutaneous Q24H  . gabapentin  600 mg Oral QHS  . guaiFENesin  1,200 mg Oral BID  . hydroxychloroquine  200 mg Oral Daily  . ipratropium-albuterol  3 mL Nebulization QID  . methylPREDNISolone (SOLU-MEDROL) injection  60 mg Intravenous Q12H  . metoprolol tartrate  25 mg Oral BID  . pantoprazole  40 mg Oral Daily  . senna  1 tablet Oral BID  . sertraline  50 mg Oral  Daily  . sodium chloride flush  3 mL Intravenous Q12H   Continuous: . sodium chloride 75 mL/hr at 12/18/18 1005  . cefTRIAXone (ROCEPHIN)  IV Stopped (12/18/18 0311)  . doxycycline (VIBRAMYCIN) IV 100 mg (12/18/18 0636)  . vancomycin     TDV:VOHYWVPXT, fluticasone, ondansetron **OR** ondansetron (ZOFRAN) IV, polyethylene glycol, sodium phosphate, sorbitol    Assessment/Plan:   Multifocal pneumonia Patient is afebrile this morning.  WBC is normal.  She continues to have a cough however.  Continue with ceftriaxone and doxycycline.  Mucinex.  Acute COPD exacerbation Continue with the nebulizer treatment and steroids antibiotics.  Flutter valve.  If there is no appreciable improvement in the next 24 hours we will consider adding inhaled steroids as well.  Acute on chronic respiratory failure with hypoxia Patient is on 2 L of oxygen via nasal cannula at home.  Required 3 to 4 L during admission.  Continue to wean down as tolerated.  History of sarcoidosis on chronic steroids Patient is on Plaquenil and prednisone at home.  Currently on Solu-Medrol.  Acute kidney injury Creatinine was noted to be elevated at 1.42.  Improved to 1.27 today.  Monitor urine output.  Avoid nephrotoxic agents.  Essential hypertension Holding Maxide.  Continue metoprolol.  Monitor blood pressures  History of breast cancer She is status post lumpectomy and radiation treatment.  She is on anastrozole.  History of depression Continue sertraline.  Normocytic anemia Mild drop in hemoglobin is likely dilutional.  No evidence of overt bleeding.  Will need continued monitoring in the outpatient setting.  DVT Prophylaxis: Lovenox    Code Status: Full code Family Communication: Discussed with the patient Disposition Plan: Management as outlined above.  Mobilize as tolerated.    LOS: 1 day   [name] [employer] on [email or web address]  12/18/2018, 1:02 PM

## 2018-12-19 LAB — BASIC METABOLIC PANEL
Anion gap: 5 (ref 5–15)
BUN: 28 mg/dL — ABNORMAL HIGH (ref 6–20)
CO2: 28 mmol/L (ref 22–32)
Calcium: 9.8 mg/dL (ref 8.9–10.3)
Chloride: 108 mmol/L (ref 98–111)
Creatinine, Ser: 1.2 mg/dL — ABNORMAL HIGH (ref 0.44–1.00)
GFR calc Af Amer: 57 mL/min — ABNORMAL LOW (ref >60)
GFR calc non Af Amer: 49 mL/min — ABNORMAL LOW (ref >60)
Glucose, Bld: 126 mg/dL — ABNORMAL HIGH (ref 70–99)
POTASSIUM: 4.6 mmol/L (ref 3.5–5.1)
Sodium: 141 mmol/L (ref 135–145)

## 2018-12-19 LAB — CBC
HCT: 28.6 % — ABNORMAL LOW (ref 36.0–46.0)
Hemoglobin: 8.6 g/dL — ABNORMAL LOW (ref 12.0–15.0)
MCH: 26 pg (ref 26.0–34.0)
MCHC: 30.1 g/dL (ref 30.0–36.0)
MCV: 86.4 fL (ref 80.0–100.0)
Platelets: 248 10*3/uL (ref 150–400)
RBC: 3.31 MIL/uL — ABNORMAL LOW (ref 3.87–5.11)
RDW: 15.9 % — ABNORMAL HIGH (ref 11.5–15.5)
WBC: 14.8 10*3/uL — ABNORMAL HIGH (ref 4.0–10.5)
nRBC: 0 % (ref 0.0–0.2)

## 2018-12-19 MED ORDER — BUDESONIDE 0.25 MG/2ML IN SUSP
0.2500 mg | Freq: Two times a day (BID) | RESPIRATORY_TRACT | Status: DC
  Administered 2018-12-19 – 2018-12-22 (×7): 0.25 mg via RESPIRATORY_TRACT
  Filled 2018-12-19 (×7): qty 2

## 2018-12-19 MED ORDER — IPRATROPIUM-ALBUTEROL 0.5-2.5 (3) MG/3ML IN SOLN
3.0000 mL | Freq: Three times a day (TID) | RESPIRATORY_TRACT | Status: DC
  Administered 2018-12-19 – 2018-12-22 (×10): 3 mL via RESPIRATORY_TRACT
  Filled 2018-12-19 (×9): qty 3

## 2018-12-19 NOTE — Progress Notes (Signed)
[facility] PROGRESS NOTE  [name] WFU:[id number] DOB: 1960-12-26 DOA: 12/17/2018  PCP: Benay Pike, MD  Brief History/Interval Summary: 58 y.o. female who presented to the ED with complaint worsening shortness of breath for 5 days. Patient has history of COPD on 2 L oxygen via nasal cannula, sarcoidosis on prednisone, history of right breast cancer status post lumpectomy and radiation in 2016, hypertension.  Evaluation in the emergency department revealed concern for multifocal pneumonia.  She was also thought to have an element of COPD exacerbation.  She was hospitalized for further management.  Reason for Visit: Multifocal pneumonia.  COPD exacerbation.  Consultants: None  Procedures: None  Antibiotics: Ceftriaxone and doxycycline  Subjective/Interval History: Patient states that she continues to have a cough with greenish-yellow expectoration.  Wheezing is better than yesterday.  Still short of breath even with minimal exertion.    ROS: Denies any nausea or vomiting  Objective:  Vital Signs  Vitals:   12/18/18 2026 12/19/18 0602 12/19/18 0719 12/19/18 0722  BP: (!) 150/80 128/84    Pulse: 92 66    Resp: 18 18    Temp: 98.4 F (36.9 C) 98.3 F (36.8 C)    TempSrc: Oral Oral    SpO2: 99% 100% 98% 98%  Weight:      Height:        Intake/Output Summary (Last 24 hours) at 12/19/2018 1200 Last data filed at 12/19/2018 1100 Gross per 24 hour  Intake 2867.98 ml  Output 2900 ml  Net -32.02 ml   Filed Weights   12/17/18 1314  Weight: 81.6 kg   General appearance: Awake alert.  In no distress Resp: Mildly tachypneic.  No use of accessory muscles.  Continues to have wheezing bilaterally but less so compared to yesterday.  No rhonchi.  Few crackles at the bases.   Cardio: S1-S2 is normal regular.  No S3-S4.  No rubs murmurs or bruit GI: Abdomen is soft.  Nontender nondistended.  Bowel sounds are present normal.  No masses organomegaly Extremities: No  edema.  Full range of motion of lower extremities. Neurologic: Alert and oriented x3.  No focal neurological deficits.    Lab Results:  Data Reviewed: I have personally reviewed following labs and imaging studies  CBC: Recent Labs  Lab 12/17/18 1352 12/18/18 0507 12/19/18 0443  WBC 9.6 9.8 14.8*  NEUTROABS 6.9  --   --   HGB 9.5* 8.6* 8.6*  HCT 31.8* 28.6* 28.6*  MCV 86.9 87.5 86.4  PLT 296 238 202    Basic Metabolic Panel: Recent Labs  Lab 12/17/18 1352 12/18/18 0507 12/19/18 0443  NA 140 141 141  K 3.2* 4.7 4.6  CL 98 105 108  CO2 34* 29 28  GLUCOSE 153* 152* 126*  BUN 21* 23* 28*  CREATININE 1.42* 1.27* 1.20*  CALCIUM 10.0 9.6 9.8    GFR: Estimated Creatinine Clearance: 53.2 mL/min (A) (by C-G formula based on SCr of 1.2 mg/dL (H)).    Radiology Studies: Dg Chest 2 View  Result Date: 12/17/2018 CLINICAL DATA:  Cough and shortness of breath EXAM: CHEST - 2 VIEW COMPARISON:  September 07, 2017 FINDINGS: There is patchy infiltrate in portions of the lingula and right middle lobe. There is also patchy infiltrate in the posterior segment right upper lobe. There are scattered areas of scarring elsewhere. Heart size and pulmonary vascular normal. No adenopathy. There are surgical clips in the right lateral breast region. IMPRESSION: Multifocal pneumonia. Areas of scattered scarring bilaterally.  Heart size normal. No adenopathy evident. Electronically Signed   By: Lowella Grip III M.D.   On: 12/17/2018 14:34     Medications:  Scheduled: . anastrozole  1 mg Oral Daily  . aspirin EC  81 mg Oral Daily  . atorvastatin  20 mg Oral Daily  . enoxaparin (LOVENOX) injection  40 mg Subcutaneous Q24H  . gabapentin  600 mg Oral QHS  . guaiFENesin  1,200 mg Oral BID  . hydroxychloroquine  200 mg Oral Daily  . ipratropium-albuterol  3 mL Nebulization TID  . methylPREDNISolone (SOLU-MEDROL) injection  60 mg Intravenous Q12H  . metoprolol tartrate  25 mg Oral BID  .  pantoprazole  40 mg Oral Daily  . senna  1 tablet Oral BID  . sertraline  50 mg Oral Daily  . sodium chloride flush  3 mL Intravenous Q12H   Continuous: . sodium chloride 50 mL/hr at 12/19/18 0206  . cefTRIAXone (ROCEPHIN)  IV 1 g (12/19/18 0238)  . doxycycline (VIBRAMYCIN) IV 100 mg (12/19/18 3845)  . vancomycin     XMI:[id number], fluticasone, ondansetron **OR** ondansetron (ZOFRAN) IV, polyethylene glycol, sodium phosphate, sorbitol    Assessment/Plan:   Multifocal pneumonia Patient remains afebrile.  WBC noted to be elevated today which is most likely due to steroids.  Continues to have a cough.  Continue with the ceftriaxone and doxycycline for now.  Mucinex.    Acute COPD exacerbation Less wheezing heard today compared to yesterday.  Continue with systemic steroids, antibiotics and nebulizer treatments.  Flutter valve.  Add nebulized steroids.  Acute on chronic respiratory failure with hypoxia Patient is on 2 L of oxygen via nasal cannula at home.  Required 3 to 4 L during admission.  Stable.  Continue to wean down oxygen as tolerated.  Maintain sats around 89% or above.  History of sarcoidosis on chronic steroids Patient is on Plaquenil and prednisone at home.  Currently on Solu-Medrol.  Acute kidney injury Creatinine was noted to be elevated at 1.42.  Creatinine continues to improve.  Could be close to her baseline now.  Avoid nephrotoxic agents.  Monitor urine output.    Essential hypertension Holding Maxide.  Continue metoprolol.  Blood pressure noted to be better controlled today.  Monitor.  History of breast cancer She is status post lumpectomy and radiation treatment.  She is on anastrozole.  History of depression Continue sertraline.  Normocytic anemia Mild drop in hemoglobin is likely dilutional.  Is stable this morning.  No evidence of overt bleeding. Will need continued monitoring in the outpatient setting.  DVT Prophylaxis: Lovenox    Code Status: Full  code Family Communication: Discussed with the patient Disposition Plan: Continue to mobilize.  Management as outlined above.    LOS: 2 days   [facility] Pager on [email or web address]  12/19/2018, 12:00 PM

## 2018-12-20 LAB — CBC
HCT: 27.4 % — ABNORMAL LOW (ref 36.0–46.0)
Hemoglobin: 8.2 g/dL — ABNORMAL LOW (ref 12.0–15.0)
MCH: 26.1 pg (ref 26.0–34.0)
MCHC: 29.9 g/dL — ABNORMAL LOW (ref 30.0–36.0)
MCV: 87.3 fL (ref 80.0–100.0)
Platelets: 248 10*3/uL (ref 150–400)
RBC: 3.14 MIL/uL — ABNORMAL LOW (ref 3.87–5.11)
RDW: 16.1 % — ABNORMAL HIGH (ref 11.5–15.5)
WBC: 11.9 10*3/uL — ABNORMAL HIGH (ref 4.0–10.5)
nRBC: 0 % (ref 0.0–0.2)

## 2018-12-20 LAB — BASIC METABOLIC PANEL
Anion gap: 5 (ref 5–15)
BUN: 27 mg/dL — ABNORMAL HIGH (ref 6–20)
CALCIUM: 9.4 mg/dL (ref 8.9–10.3)
CHLORIDE: 109 mmol/L (ref 98–111)
CO2: 26 mmol/L (ref 22–32)
CREATININE: 1.05 mg/dL — AB (ref 0.44–1.00)
GFR calc Af Amer: >60 mL/min (ref >60)
GFR calc non Af Amer: 58 mL/min — ABNORMAL LOW (ref >60)
Glucose, Bld: 119 mg/dL — ABNORMAL HIGH (ref 70–99)
Potassium: 4.1 mmol/L (ref 3.5–5.1)
Sodium: 140 mmol/L (ref 135–145)

## 2018-12-20 MED ORDER — PREDNISONE 20 MG PO TABS
40.0000 mg | ORAL_TABLET | Freq: Two times a day (BID) | ORAL | Status: DC
  Administered 2018-12-20 – 2018-12-22 (×4): 40 mg via ORAL
  Filled 2018-12-20 (×4): qty 2

## 2018-12-20 NOTE — Progress Notes (Addendum)
[facility] PROGRESS NOTE  [name] LYY:[id number] DOB: 1961-03-09 DOA: 12/17/2018  PCP: Benay Pike, MD  Brief History/Interval Summary: 58 y.o. female who presented to the ED with complaint worsening shortness of breath for 5 days. Patient has history of COPD on 2 L oxygen via nasal cannula, sarcoidosis on prednisone, history of right breast cancer status post lumpectomy and radiation in 2016, hypertension.  Evaluation in the emergency department revealed concern for multifocal pneumonia.  She was also thought to have an element of COPD exacerbation.  She was hospitalized for further management.  Reason for Visit: Multifocal pneumonia.  COPD exacerbation.  Consultants: None  Procedures: None  Antibiotics: Ceftriaxone and doxycycline  Subjective/Interval History: Patient complains of feeling short of breath after she walked from the bathroom to her bed.  Denies any chest pain.  Overall her cough and wheezing have improved.     ROS: Denies any nausea or vomiting  Objective:  Vital Signs  Vitals:   12/20/18 0553 12/20/18 0852 12/20/18 0858 12/20/18 0920  BP: (!) 157/90   (!) 167/88  Pulse: 72   78  Resp: 16     Temp: 97.7 F (36.5 C)     TempSrc: Oral     SpO2: 97% 95% 95%   Weight:      Height:        Intake/Output Summary (Last 24 hours) at 12/20/2018 1106 Last data filed at 12/20/2018 1031 Gross per 24 hour  Intake 1968.99 ml  Output 3100 ml  Net -1131.01 ml   Filed Weights   12/17/18 1314  Weight: 81.6 kg   General appearance: Awake alert.  In no distress Resp: Noted to be tachypneic this morning.  No use of accessory muscles.  Improved aeration bilaterally.  Less wheezing compared to yesterday.  No rhonchi.  Few crackles at the bases.   Cardio: S1-S2 is normal regular.  No S3-S4.  No rubs murmurs or bruit GI: Abdomen is soft.  Nontender nondistended.  Bowel sounds are present normal.  No masses organomegaly Extremities: No edema.  Full range of  motion of lower extremities. Neurologic: Alert and oriented x3.  No focal neurological deficits.     Lab Results:  Data Reviewed: I have personally reviewed following labs and imaging studies  CBC: Recent Labs  Lab 12/17/18 1352 12/18/18 0507 12/19/18 0443 12/20/18 0447  WBC 9.6 9.8 14.8* 11.9*  NEUTROABS 6.9  --   --   --   HGB 9.5* 8.6* 8.6* 8.2*  HCT 31.8* 28.6* 28.6* 27.4*  MCV 86.9 87.5 86.4 87.3  PLT 296 238 248 127    Basic Metabolic Panel: Recent Labs  Lab 12/17/18 1352 12/18/18 0507 12/19/18 0443 12/20/18 0447  NA 140 141 141 140  K 3.2* 4.7 4.6 4.1  CL 98 105 108 109  CO2 34* 29 28 26   GLUCOSE 153* 152* 126* 119*  BUN 21* 23* 28* 27*  CREATININE 1.42* 1.27* 1.20* 1.05*  CALCIUM 10.0 9.6 9.8 9.4    GFR: Estimated Creatinine Clearance: 60.8 mL/min (A) (by C-G formula based on SCr of 1.05 mg/dL (H)).    Radiology Studies: No results found.   Medications:  Scheduled: . anastrozole  1 mg Oral Daily  . aspirin EC  81 mg Oral Daily  . atorvastatin  20 mg Oral Daily  . budesonide (PULMICORT) nebulizer solution  0.25 mg Nebulization BID  . enoxaparin (LOVENOX) injection  40 mg Subcutaneous Q24H  . gabapentin  600 mg Oral QHS  .  guaiFENesin  1,200 mg Oral BID  . hydroxychloroquine  200 mg Oral Daily  . ipratropium-albuterol  3 mL Nebulization TID  . methylPREDNISolone (SOLU-MEDROL) injection  60 mg Intravenous Q12H  . metoprolol tartrate  25 mg Oral BID  . pantoprazole  40 mg Oral Daily  . senna  1 tablet Oral BID  . sertraline  50 mg Oral Daily  . sodium chloride flush  3 mL Intravenous Q12H   Continuous: . sodium chloride Stopped (12/20/18 0513)  . cefTRIAXone (ROCEPHIN)  IV Stopped (12/20/18 0345)  . doxycycline (VIBRAMYCIN) IV 100 mg (12/20/18 0513)  . vancomycin     MEB:[id number], fluticasone, ondansetron **OR** ondansetron (ZOFRAN) IV, polyethylene glycol, sodium phosphate, sorbitol    Assessment/Plan:   Multifocal pneumonia She  remains afebrile.  Leukocytosis is most likely due to steroids.  Cough is improved.  Continue antibiotics intravenously for 1 more day.  Mucinex.     Acute COPD exacerbation Slow to improve.  However wheezing appears to be less today compared to yesterday.  Continue nebulizer treatment and systemic steroids.  Continue nebulized steroids.  Flutter valve.  Cut back on steroids.    Acute on chronic respiratory failure with hypoxia Patient is on 2 L of oxygen via nasal cannula at home.  Required 3 to 4 L during admission.  Stable.  Continue to wean down oxygen as tolerated.  Maintain sats around 89% or above.  History of sarcoidosis on chronic steroids Patient is on Plaquenil and prednisone at home.  Currently on Solu-Medrol.  Acute kidney injury Creatinine was noted to be elevated at 1.42.  Creatinine has improved and could be close to her baseline now.  Avoid nephrotoxic agents.  Good urine output is noted.    Essential hypertension Holding Maxide.  Continue metoprolol.  Continue to monitor.  History of breast cancer She is status post lumpectomy and radiation treatment.  She is on anastrozole.  History of depression Continue sertraline.  Normocytic anemia Mild drop in hemoglobin is likely dilutional. No evidence of overt bleeding. Will need continued monitoring in the outpatient setting.  DVT Prophylaxis: Lovenox    Code Status: Full code Family Communication: Discussed with the patient Disposition Plan: Mobilize as tolerated.  Change to oral steroids today.    LOS: 3 days   Farrell Pantaleo [employer] on [email or web address]  12/20/2018, 11:06 AM

## 2018-12-21 MED ORDER — CEFDINIR 300 MG PO CAPS
300.0000 mg | ORAL_CAPSULE | Freq: Two times a day (BID) | ORAL | Status: DC
  Administered 2018-12-21 – 2018-12-22 (×2): 300 mg via ORAL
  Filled 2018-12-21 (×3): qty 1

## 2018-12-21 MED ORDER — METOPROLOL TARTRATE 50 MG PO TABS
50.0000 mg | ORAL_TABLET | Freq: Two times a day (BID) | ORAL | Status: DC
  Administered 2018-12-21 – 2018-12-22 (×2): 50 mg via ORAL
  Filled 2018-12-21 (×2): qty 1

## 2018-12-21 MED ORDER — METOPROLOL TARTRATE 25 MG PO TABS
25.0000 mg | ORAL_TABLET | Freq: Once | ORAL | Status: AC
  Administered 2018-12-21: 25 mg via ORAL
  Filled 2018-12-21: qty 1

## 2018-12-21 NOTE — Progress Notes (Signed)
[facility] PROGRESS NOTE  [name] EHM:[id number] DOB: 1961-04-07 DOA: 12/17/2018  PCP: Benay Pike, MD  Brief History/Interval Summary: 58 y.o. female who presented to the ED with complaint worsening shortness of breath for 5 days. Patient has history of COPD on 2 L oxygen via nasal cannula, sarcoidosis on prednisone, history of right breast cancer status post lumpectomy and radiation in 2016, hypertension.  Evaluation in the emergency department revealed concern for multifocal pneumonia.  She was also thought to have an element of COPD exacerbation.  She was hospitalized for further management.  Reason for Visit: Multifocal pneumonia.  COPD exacerbation.  Consultants: None  Procedures: None  Antibiotics: Ceftriaxone and doxycycline  Subjective/Interval History: Patient states that she is starting to feel better this morning.  Not coughing as much.  Still getting short of breath with exertion.  Denies any nausea or vomiting.     ROS: Denies any headaches  Objective:  Vital Signs  Vitals:   12/20/18 2117 12/20/18 2148 12/21/18 0611 12/21/18 1032  BP:  (!) 168/83 (!) 186/89   Pulse:  72 73   Resp:  20 20   Temp:  98.3 F (36.8 C) 98.7 F (37.1 C)   TempSrc:  Oral Oral   SpO2: 97% 99% 99% 93%  Weight:      Height:        Intake/Output Summary (Last 24 hours) at 12/21/2018 1245 Last data filed at 12/21/2018 0900 Gross per 24 hour  Intake 955.5 ml  Output 1300 ml  Net -344.5 ml   Filed Weights   12/17/18 1314  Weight: 81.6 kg   General appearance: Awake alert.  In no distress Resp: Less tachypneic today.  Coarse breath sounds.  Less wheezing today.  No use of accessory muscles.  Few crackles at the bases.  No rhonchi.   Cardio: S1-S2 is normal regular.  No S3-S4.  No rubs murmurs or bruit GI: Abdomen is soft.  Nontender nondistended.  Bowel sounds are present normal.  No masses organomegaly Extremities: No edema.  Full range of motion of lower  extremities. Neurologic: Alert and oriented x3.  No focal neurological deficits.    Lab Results:  Data Reviewed: I have personally reviewed following labs and imaging studies  CBC: Recent Labs  Lab 12/17/18 1352 12/18/18 0507 12/19/18 0443 12/20/18 0447  WBC 9.6 9.8 14.8* 11.9*  NEUTROABS 6.9  --   --   --   HGB 9.5* 8.6* 8.6* 8.2*  HCT 31.8* 28.6* 28.6* 27.4*  MCV 86.9 87.5 86.4 87.3  PLT 296 238 248 366    Basic Metabolic Panel: Recent Labs  Lab 12/17/18 1352 12/18/18 0507 12/19/18 0443 12/20/18 0447  NA 140 141 141 140  K 3.2* 4.7 4.6 4.1  CL 98 105 108 109  CO2 34* 29 28 26   GLUCOSE 153* 152* 126* 119*  BUN 21* 23* 28* 27*  CREATININE 1.42* 1.27* 1.20* 1.05*  CALCIUM 10.0 9.6 9.8 9.4    GFR: Estimated Creatinine Clearance: 60.8 mL/min (A) (by C-G formula based on SCr of 1.05 mg/dL (H)).    Radiology Studies: No results found.   Medications:  Scheduled: . anastrozole  1 mg Oral Daily  . aspirin EC  81 mg Oral Daily  . atorvastatin  20 mg Oral Daily  . budesonide (PULMICORT) nebulizer solution  0.25 mg Nebulization BID  . enoxaparin (LOVENOX) injection  40 mg Subcutaneous Q24H  . gabapentin  600 mg Oral QHS  . guaiFENesin  1,200 mg Oral BID  . hydroxychloroquine  200 mg Oral Daily  . ipratropium-albuterol  3 mL Nebulization TID  . metoprolol tartrate  25 mg Oral Once  . metoprolol tartrate  50 mg Oral BID  . pantoprazole  40 mg Oral Daily  . predniSONE  40 mg Oral BID WC  . senna  1 tablet Oral BID  . sertraline  50 mg Oral Daily  . sodium chloride flush  3 mL Intravenous Q12H   Continuous: . cefTRIAXone (ROCEPHIN)  IV 1 g (12/21/18 0146)  . doxycycline (VIBRAMYCIN) IV 100 mg (12/21/18 0528)  . vancomycin     PQD:IYMEBRAXE, fluticasone, ondansetron **OR** ondansetron (ZOFRAN) IV, polyethylene glycol, sodium phosphate, sorbitol     Assessment/Plan:  Multifocal pneumonia Patient has been slow to improve but feels better this morning.   Cough is improved.  She remains afebrile.  Leukocytosis likely due to steroids.  She will complete 5 days of doxycycline today.  We will stop after today's dose.  Change ceftriaxone to Omnicef.  Mucinex.    Acute COPD exacerbation Patient slowly improving.  Aeration has improved.  Less wheezing.  Continue with systemic steroids.  Changed over to oral yesterday.    Acute on chronic respiratory failure with hypoxia Patient is on 2 L of oxygen via nasal cannula at home.  Required 3 to 4 L during admission.  Stable.  Decrease oxygen to 2 L/min by nasal cannula.  Sats have been greater than 95%.    History of sarcoidosis on chronic steroids Patient is on Plaquenil and prednisone at home.  Currently on Solu-Medrol.  Acute kidney injury Creatinine was noted to be elevated at 1.42.  Creatinine has improved and potentially back to her baseline.  Continue to avoid nephrotoxic agents.  Good urine output.     Essential hypertension Blood pressure is poorly controlled likely due to steroids.  We are also holding her Maxide.  Increase the dose of metoprolol.    History of breast cancer She is status post lumpectomy and radiation treatment.  She is on anastrozole.  History of depression Continue sertraline.  Normocytic anemia Mild drop in hemoglobin is likely dilutional. No evidence of overt bleeding. Will need continued monitoring in the outpatient setting.  DVT Prophylaxis: Lovenox    Code Status: Full code Family Communication: Discussed with the patient Disposition Plan: Continue to mobilize.  Change to oral antibiotics today.  Hopefully discharge tomorrow.    LOS: 4 days   Rorik Vespa [employer] on [email or web address]  12/21/2018, 12:45 PM

## 2018-12-22 LAB — CULTURE, BLOOD (ROUTINE X 2)
Culture: NO GROWTH
Culture: NO GROWTH
SPECIAL REQUESTS: ADEQUATE
Special Requests: ADEQUATE

## 2018-12-22 MED ORDER — SENNA 8.6 MG PO TABS: 1.0000 | ORAL_TABLET | Freq: Two times a day (BID) | ORAL | 0 refills | Status: DC

## 2018-12-22 MED ORDER — PREDNISONE 5 MG PO TABS: 10.0000 mg | ORAL_TABLET | Freq: Every day | ORAL | Status: DC

## 2018-12-22 MED ORDER — CEFDINIR 300 MG PO CAPS: 300.0000 mg | ORAL_CAPSULE | Freq: Two times a day (BID) | ORAL | 0 refills | Status: AC

## 2018-12-22 MED ORDER — ENALAPRILAT 1.25 MG/ML IV SOLN
1.2500 mg | Freq: Once | INTRAVENOUS | Status: DC
  Filled 2018-12-22: qty 1

## 2018-12-22 MED ORDER — PREDNISONE 20 MG PO TABS: ORAL_TABLET | ORAL | 0 refills | Status: DC

## 2018-12-22 MED ORDER — ALBUTEROL SULFATE (2.5 MG/3ML) 0.083% IN NEBU: 2.5000 mg | INHALATION_SOLUTION | Freq: Four times a day (QID) | RESPIRATORY_TRACT | 1 refills | Status: DC | PRN

## 2018-12-22 NOTE — Discharge Instructions (Signed)
Community-Acquired Pneumonia, Adult °Pneumonia is an infection of the lungs. There are different types of pneumonia. One type can develop while a person is in a hospital. A different type, called community-acquired pneumonia, develops in people who are not, or have not recently been, in the hospital or other health care facility. °What are the causes? ° °Pneumonia may be caused by bacteria, viruses, or funguses. Community-acquired pneumonia is often caused by Streptococcus pneumonia bacteria. These bacteria are often passed from one person to another by breathing in droplets from the cough or sneeze of an infected person. °What increases the risk? °The condition is more likely to develop in: °· People who have chronic diseases, such as chronic obstructive pulmonary disease (COPD), asthma, congestive heart failure, cystic fibrosis, diabetes, or kidney disease. °· People who have early-stage or late-stage HIV. °· People who have sickle cell disease. °· People who have had their spleen removed (splenectomy). °· People who have poor dental hygiene. °· People who have medical conditions that increase the risk of breathing in (aspirating) secretions their own mouth and nose. °· People who have a weakened immune system (immunocompromised). °· People who smoke. °· People who travel to areas where pneumonia-causing germs commonly exist. °· People who are around animal habitats or animals that have pneumonia-causing germs, including birds, bats, rabbits, cats, and farm animals. °What are the signs or symptoms? °Symptoms of this condition include: °· A dry cough. °· A wet (productive) cough. °· Fever. °· Sweating. °· Chest pain, especially when breathing deeply or coughing. °· Rapid breathing or difficulty breathing. °· Shortness of breath. °· Shaking chills. °· Fatigue. °· Muscle aches. °How is this diagnosed? °Your health care provider will take a medical history and perform a physical exam. You may also have other tests,  including: °· Imaging studies of your chest, including X-rays. °· Tests to check your blood oxygen level and other blood gases. °· Other tests on blood, mucus (sputum), fluid around your lungs (pleural fluid), and urine. °If your pneumonia is severe, other tests may be done to identify the specific cause of your illness. °How is this treated? °The type of treatment that you receive depends on many factors, such as the cause of your pneumonia, the medicines you take, and other medical conditions that you have. For most adults, treatment and recovery from pneumonia may occur at home. In some cases, treatment must happen in a hospital. Treatment may include: °· Antibiotic medicines, if the pneumonia was caused by bacteria. °· Antiviral medicines, if the pneumonia was caused by a virus. °· Medicines that are given by mouth or through an IV tube. °· Oxygen. °· Respiratory therapy. °Although rare, treating severe pneumonia may include: °· Mechanical ventilation. This is done if you are not breathing well on your own and you cannot maintain a safe blood oxygen level. °· Thoracentesis. This procedure removes fluid around one lung or both lungs to help you breathe better. °Follow these instructions at home: ° °· Take over-the-counter and prescription medicines only as told by your health care provider. °? Only take cough medicine if you are losing sleep. Understand that cough medicine can prevent your body’s natural ability to remove mucus from your lungs. °? If you were prescribed an antibiotic medicine, take it as told by your health care provider. Do not stop taking the antibiotic even if you start to feel better. °· Sleep in a semi-upright position at night. Try sleeping in a reclining chair, or place a few pillows under your head. °· Do not use tobacco products, including cigarettes, chewing tobacco, and e-cigarettes.   If you need help quitting, ask your health care provider. °· Drink enough water to keep your urine  clear or pale yellow. This will help to thin out mucus secretions in your lungs. °How is this prevented? °There are ways that you can decrease your risk of developing community-acquired pneumonia. Consider getting a pneumococcal vaccine if: °· You are older than 59 years of age. °· You are older than 59 years of age and are undergoing cancer treatment, have chronic lung disease, or have other medical conditions that affect your immune system. Ask your health care provider if this applies to you. °There are different types and schedules of pneumococcal vaccines. Ask your health care provider which vaccination option is best for you. °You may also prevent community-acquired pneumonia if you take these actions: °· Get an influenza vaccine every year. Ask your health care provider which type of influenza vaccine is best for you. °· Go to the dentist on a regular basis. °· Wash your hands often. Use hand sanitizer if soap and water are not available. °Contact a health care provider if: °· You have a fever. °· You are losing sleep because you cannot control your cough with cough medicine. °Get help right away if: °· You have worsening shortness of breath. °· You have increased chest pain. °· Your sickness becomes worse, especially if you are an older adult or have a weakened immune system. °· You cough up blood. °This information is not intended to replace advice given to you by your health care provider. Make sure you discuss any questions you have with your health care provider. °Document Released: 10/30/2005 Document Revised: 07/19/2017 Document Reviewed: 02/24/2015 °[employer] Interactive Patient Education © 2019 [employer] ° °

## 2018-12-22 NOTE — Discharge Summary (Signed)
[facility]  Physician Discharge Summary   Patient ID: [name] MRN: [id number] DOB/AGE: Feb 23, 1961 58 y.o.  Admit date: 12/17/2018 Discharge date: 12/22/2018  PCP: Benay Pike, MD  DISCHARGE DIAGNOSES:  Multifocal pneumonia likely community-acquired Acute COPD exacerbation, improved Acute on chronic respiratory failure with hypoxia History of sarcoidosis on chronic steroids Acute kidney injury, resolved Essential hypertension History of breast cancer  RECOMMENDATIONS FOR OUTPATIENT FOLLOW UP: 1. Outpatient follow-up with PCP and pulmonology 2. Patient discharged with nebulizer machine 3. Consider outpatient work-up for anemia if not done previously. 4. Check renal function in a week or so.  Home Health: None Equipment/Devices: Nebulizer machine  CODE STATUS: Full code  DISCHARGE CONDITION: fair  Diet recommendation: As before  INITIAL HISTORY:  58 y.o.femalewho presented to the ED with complaint worsening shortness of breath for 5 days. Patient has history of COPD on 2 L oxygen via nasal cannula, sarcoidosis on prednisone, history of right breast cancer status post lumpectomy and radiation in 2016, hypertension.  Evaluation in the emergency department revealed concern for multifocal pneumonia.  She was also thought to have an element of COPD exacerbation.  She was hospitalized for further management.   HOSPITAL COURSE:   Multifocal pneumonia Patient was admitted to the hospital.  She had higher oxygen requirements than at baseline.  She was started on ceftriaxone and doxycycline.  She started improving.  Cough improved.  She was transitioned to oral antibiotics.   Acute COPD exacerbation Patient was given nebulizer treatment and systemic steroids.  Patient is improving.  She will be written for home nebulizer machine.  She follows with Dr. Melvyn Novas for her COPD.     Acute on chronic respiratory failure with hypoxia Patient is on 2 L of oxygen via  nasal cannula at home.  Required 3 to 4 L during admission.  Stable.  Decrease oxygen to 2 L/min by nasal cannula.  Sats have been greater than 95%.    History of sarcoidosis on chronic steroids Patient is on Plaquenil and prednisone at home.  She may resume her usual home dose of prednisone after she completes steroid taper.  Acute kidney injury Creatinine was noted to be elevated at 1.42.  Creatinine has improved and potentially back to her baseline.   Essential hypertension May resume all of her home medications.  Outpatient follow-up.  History of breast cancer She is status post lumpectomy and radiation treatment.  She is on anastrozole.  History of depression Continue sertraline.  Normocytic anemia Mild drop in hemoglobin is likely dilutional. No evidence of overt bleeding. Will need continued monitoring in the outpatient setting.  Overall stable.  She was ambulating in the hallway without any difficulty.  Okay for discharge home today.   PERTINENT LABS:  The results of significant diagnostics from this hospitalization (including imaging, microbiology, ancillary and laboratory) are listed below for reference.    Microbiology: Recent Results (from the past 240 hour(s))  Blood Culture (routine x 2)     Status: None (Preliminary result)   Collection Time: 12/17/18  4:20 PM  Result Value Ref Range Status   Specimen Description   Final    BLOOD RIGHT ANTECUBITAL Performed at [facility] [address], [place], [place] [address]    Special Requests   Final    BOTTLES DRAWN AEROBIC AND ANAEROBIC Blood Culture adequate volume Performed at [facility] [address], [place], [place] [address]    Culture   Final  NO GROWTH 4 DAYS Performed at [facility], [place] [address] [place], [place] [address]    Report Status PENDING  Incomplete  Blood Culture (routine x 2)     Status: None (Preliminary result)   Collection  Time: 12/17/18  5:59 PM  Result Value Ref Range Status   Specimen Description   Final    BLOOD RIGHT HAND Performed at [facility] [address] [place], [place] [address]    Special Requests   Final    BOTTLES DRAWN AEROBIC AND ANAEROBIC Blood Culture adequate volume Performed at [facility] [address] [place], [place] [address]    Culture   Final    NO GROWTH 4 DAYS Performed at [facility], [place] [address] [place],  [address]    Report Status PENDING  Incomplete     Labs: Basic Metabolic Panel: Recent Labs  Lab 12/17/18 1352 12/18/18 0507 12/19/18 0443 12/20/18 0447  NA 140 141 141 140  K 3.2* 4.7 4.6 4.1  CL 98 105 108 109  CO2 34* 29 28 26   GLUCOSE 153* 152* 126* 119*  BUN 21* 23* 28* 27*  CREATININE 1.42* 1.27* 1.20* 1.05*  CALCIUM 10.0 9.6 9.8 9.4   CBC: Recent Labs  Lab 12/17/18 1352 12/18/18 0507 12/19/18 0443 12/20/18 0447  WBC 9.6 9.8 14.8* 11.9*  NEUTROABS 6.9  --   --   --   HGB 9.5* 8.6* 8.6* 8.2*  HCT 31.8* 28.6* 28.6* 27.4*  MCV 86.9 87.5 86.4 87.3  PLT 296 238 248 248    IMAGING STUDIES Dg Chest 2 View  Result Date: 12/17/2018 CLINICAL DATA:  Cough and shortness of breath EXAM: CHEST - 2 VIEW COMPARISON:  September 07, 2017 FINDINGS: There is patchy infiltrate in portions of the lingula and right middle lobe. There is also patchy infiltrate in the posterior segment right upper lobe. There are scattered areas of scarring elsewhere. Heart size and pulmonary vascular normal. No adenopathy. There are surgical clips in the right lateral breast region. IMPRESSION: Multifocal pneumonia. Areas of scattered scarring bilaterally. Heart size normal. No adenopathy evident. Electronically Signed   By: Lowella Grip III M.D.   On: 12/17/2018 14:34    DISCHARGE EXAMINATION: Vitals:   12/22/18 0003 12/22/18 0051 12/22/18 0546 12/22/18 0856  BP: (!) 183/98 (!) 156/87 (!) 174/97   Pulse: 71 67 74    Resp: 18  18   Temp:   98.7 F (37.1 C)   TempSrc:   Oral   SpO2: 97%  94% 95%  Weight:      Height:       General appearance: alert, cooperative, appears stated age and no distress Resp: Improved aeration bilaterally.  Very minimal wheezing appreciated.  Normal effort. Cardio: regular rate and rhythm, S1, S2 normal, no murmur, click, rub or gallop GI: soft, non-tender; bowel sounds normal; no masses,  no organomegaly  DISPOSITION: Home  Discharge Instructions    Call MD for:  difficulty breathing, headache or visual disturbances   Complete by:  As directed    Call MD for:  extreme fatigue   Complete by:  As directed    Call MD for:  persistant dizziness or light-headedness   Complete by:  As directed    Call MD for:  persistant nausea and vomiting   Complete by:  As directed    Call MD for:  severe uncontrolled pain   Complete by:  As directed  Call MD for:  temperature >100.4   Complete by:  As directed    Diet - low sodium heart healthy   Complete by:  As directed    Discharge instructions   Complete by:  As directed    Please be sure to follow-up with Dr. Melvyn Novas.  Take your medications as prescribed.  You were cared for by a hospitalist during your hospital stay. If you have any questions about your discharge medications or the care you received while you were in the hospital after you are discharged, you can call the unit and asked to speak with the hospitalist on call if the hospitalist that took care of you is not available. Once you are discharged, your primary care physician will handle any further medical issues. Please note that NO REFILLS for any discharge medications will be authorized once you are discharged, as it is imperative that you return to your primary care physician (or establish a relationship with a primary care physician if you do not have one) for your aftercare needs so that they can reassess your need for medications and monitor your lab values. If  you do not have a primary care physician, you can call [phone] for a physician referral.   Increase activity slowly   Complete by:  As directed         Allergies as of 12/22/2018      Reactions   Codeine Other (See Comments)   Avoids-felt bad when took before      Medication List    TAKE these medications   albuterol 108 (90 Base) MCG/ACT inhaler Commonly known as:  VENTOLIN HFA Inhale 2 puffs into the lungs every 6 (six) hours as needed for wheezing or shortness of breath. What changed:  Another medication with the same name was added. Make sure you understand how and when to take each.   albuterol (2.5 MG/3ML) 0.083% nebulizer solution Commonly known as:  PROVENTIL Take 3 mLs (2.5 mg total) by nebulization every 6 (six) hours as needed for wheezing or shortness of breath. What changed:  You were already taking a medication with the same name, and this prescription was added. Make sure you understand how and when to take each.   anastrozole 1 MG tablet Commonly known as:  ARIMIDEX Take 1 tablet (1 mg total) by mouth daily.   aspirin EC 81 MG tablet Take 81 mg by mouth daily.   atorvastatin 20 MG tablet Commonly known as:  LIPITOR Take 1 tablet (20 mg total) by mouth daily.   cefdinir 300 MG capsule Commonly known as:  OMNICEF Take 1 capsule (300 mg total) by mouth every 12 (twelve) hours for 2 days.   erythromycin ophthalmic ointment Place 1 application into both eyes at bedtime.   fluticasone 50 MCG/ACT nasal spray Commonly known as:  FLONASE Place 2 sprays into both nostrils daily as needed for allergies.   gabapentin 300 MG capsule Commonly known as:  NEURONTIN Take 2 capsules (600 mg total) by mouth at bedtime. 2 at bedtime   hydroxychloroquine 200 MG tablet Commonly known as:  PLAQUENIL Take 1 tablet (200 mg total) by mouth daily.   metoprolol tartrate 25 MG tablet Commonly known as:  LOPRESSOR Take 1 tablet (25 mg total) by mouth 2 (two) times daily.    MUCINEX MAXIMUM STRENGTH 1200 MG Tb12 Generic drug:  Guaifenesin Take 1,200 mg by mouth 2 (two) times daily as needed.   multivitamin capsule Take 1 capsule by mouth daily.  omeprazole 20 MG capsule Commonly known as:  PRILOSEC TAKE 1 CAPSULE BY MOUTH ONCE DAILY   OXYGEN 2lpm with rest and 3-4 lpm with exertion   predniSONE 5 MG tablet Commonly known as:  DELTASONE Take 2 tablets (10 mg total) by mouth daily with breakfast. Please resume after you have completed the prednisone taper. What changed:    how much to take  additional instructions   predniSONE 20 MG tablet Commonly known as:  DELTASONE Take 3 tablets orally daily for 3 days, then 2 tablets daily for 3 days, then 1 tablet daily for 3 days, then the usual dose of prednisone. What changed:  You were already taking a medication with the same name, and this prescription was added. Make sure you understand how and when to take each.   senna 8.6 MG Tabs tablet Commonly known as:  SENOKOT Take 1 tablet (8.6 mg total) by mouth 2 (two) times daily.   sertraline 50 MG tablet Commonly known as:  ZOLOFT Take 1 tablet (50 mg total) by mouth daily.   STIOLTO RESPIMAT 2.5-2.5 MCG/ACT Aers Generic drug:  Tiotropium Bromide-Olodaterol INHALE 2 PUFFS INTO THE LUNGS DAILY What changed:    See the new instructions.  Another medication with the same name was removed. Continue taking this medication, and follow the directions you see here.   triamterene-hydrochlorothiazide 37.5-25 MG tablet Commonly known as:  MAXZIDE-25 TAKE 1 TABLET BY MOUTH ONCE DAILY            Durable Medical Equipment  (From admission, onward)         Start     Ordered   12/22/18 1149  For home use only DME Nebulizer machine  Once    Question:  Patient needs a nebulizer to treat with the following condition  Answer:  Reactive airway disease   12/22/18 1148           Follow-up Information    [facility].  Schedule an appointment as soon as possible for a visit.   Contact information: [place]       Tanda Rockers, MD. Schedule an appointment as soon as possible for a visit in 1 week(s).   Specialty:  Pulmonary Disease Contact information: [place] [address] [phone]           TOTAL DISCHARGE TIME: 37 minutes  [facility] Pager on [email or web address]  12/22/2018, 11:49 AM

## 2018-12-22 NOTE — Care Management Note (Addendum)
Case Management Note  Patient Details  Name: DEBBIE YEARICK MRN: [id number] Date of Birth: 1961-09-08  Subjective/Objective:    Multifocal PNA, COPD exac                Action/Plan: Spoke to pt and provided with [facility] letter with $3 copay with a once per year use. Provided pt with info on [facility] or [facility] to call and schedule appt. Contacted [facility] for neb machine for home.    [facility] not needed. Pt has [employer] for insurance. Faxed to [facility] and admissions. Pt able to get meds and has PCP.    Expected Discharge Date:  12/22/18               Expected Discharge Plan:  Home/Self Care  In-House Referral:  NA  Discharge planning Services  CM Consult, [facility], Medication Assistance, [facility]  Post Acute Care Choice:  Durable Medical Equipment(inogen 02-travel tank) Choice offered to:  Patient  DME Arranged:  Nebulizer machine DME Agency:  [place]:  NA HH Agency:  NA  Status of Service:  Completed, signed off  If discussed at [facility] of Stay Meetings, dates discussed:    Additional Comments:  Erenest Rasher, RN 12/22/2018, 3:23 PM

## 2018-12-22 NOTE — Progress Notes (Addendum)
Assessment unchanged. Pt verbalized understanding of dc instructions through teach back including follow up care and when to call the doctor. No scripts. All needed equipment for home arrived earlier. Daughter brought pt's portable oxygen tank to travel home. Oxygen applied via cannula. Discharged via wc to front entrance accompanied by RN and daughter.

## 2018-12-23 ENCOUNTER — Ambulatory Visit: Payer: BLUE CROSS/BLUE SHIELD | Admitting: Internal Medicine

## 2018-12-30 ENCOUNTER — Telehealth: Payer: Self-pay | Admitting: *Deleted

## 2018-12-30 ENCOUNTER — Ambulatory Visit (INDEPENDENT_AMBULATORY_CARE_PROVIDER_SITE_OTHER): Payer: Self-pay | Admitting: Internal Medicine

## 2018-12-30 ENCOUNTER — Ambulatory Visit (INDEPENDENT_AMBULATORY_CARE_PROVIDER_SITE_OTHER)
Admission: RE | Admit: 2018-12-30 | Discharge: 2018-12-30 | Disposition: A | Discharge status: Home / Self Care | Payer: Self-pay | Source: Ambulatory Visit | Attending: Internal Medicine | Admitting: Internal Medicine

## 2018-12-30 ENCOUNTER — Encounter: Payer: Self-pay | Admitting: Internal Medicine

## 2018-12-30 VITALS — BP 124/66 | HR 75 | Ht 65.0 in | Wt 188.0 lb

## 2018-12-30 DIAGNOSIS — J9612 Chronic respiratory failure with hypercapnia: Secondary | ICD-10-CM

## 2018-12-30 DIAGNOSIS — J9611 Chronic respiratory failure with hypoxia: Secondary | ICD-10-CM

## 2018-12-30 DIAGNOSIS — J449 Chronic obstructive pulmonary disease, unspecified: Secondary | ICD-10-CM

## 2018-12-30 DIAGNOSIS — D869 Sarcoidosis, unspecified: Secondary | ICD-10-CM

## 2018-12-30 MED ORDER — TIOTROPIUM BROMIDE-OLODATEROL 2.5-2.5 MCG/ACT IN AERS: 2.0000 | INHALATION_SPRAY | Freq: Every day | RESPIRATORY_TRACT | 0 refills | Status: DC

## 2018-12-30 MED ORDER — PREDNISONE 10 MG PO TABS: ORAL_TABLET | ORAL | 1 refills | Status: DC

## 2018-12-30 NOTE — Telephone Encounter (Signed)
-----   Message from Benay Pike, MD sent at 12/28/2018  9:24 AM EST ----- Regarding: appointment Can we schedule her a follow up appointment?  She was discharged from a different hospital not long ago and doesn't have anything scheduled with  me.

## 2018-12-30 NOTE — Progress Notes (Signed)
Subjective:    Patient ID: [name], female   DOB: November 06, 1961    MRN: [id number]    Brief patient profile:  72 yobf quit smoking 10/2004 dx by transbronchial biopsy 5/92 with NCG  consistent with sarcoid. Since that time she's been off and on prednisone multiple  times with flares of coughing dyspnea and skin involvement > weaned off chronic prednisone Feb 2012 and placed back on 10/10/16 for deteriorating obstructive changes on pfts  With resp failure/ 02 dep        History of Present Illness  02/20/2017  f/u ov/[name] re:   GOLD III/ pred 10 mg daily and bevespi 2bid  and alb rare  Chief Complaint  Patient presents with  . Follow-up    6wk rov. pt states breathing is baseline. pt reports of sob with exertion & occ prod cough with green mucus mainly in the morning   Doe better :  [facility] = can't walk a nl pace on a flat grade s sob but does fine slow and flat eg shopping on  2lpm / ok at rest s 02  Uses 02 2lpm sleeping  rec Drop prednisone to 10 mg one half daily      NP eval 05/14/17  We will decrease your prednisone to 2.5 mg daily. We will prescribe 5 mg tablets. We will renew your prescription for Symbicort.  2 puffs twice daily. Rinse mouth after use. Follow up appointment with Dr. Melvyn Novas in 2 months. We will refer you to Pulmonary rehab > started Aug 2018  Referral to a dietician for weight loss >  at rehab     Date of Admission: 09/07/2017                    Date of Discharge: 09/09/17    Discharge Diagnoses/Problem List:  COPD GOLD stage IV HTN Anemia HLD Breast cancer s/p resection Sarcoidosis GERD      11/26/2017  f/u ov/[name] re:  Sarcoid/skin involvement chronic cough ? Sinus dz/ using med calendar well / pednisone 10 mg daily  Chief Complaint  Patient presents with  . Follow-up    Cough has improved some, but still producing some greenish yellow sputum first thing in the am.  She has not had to use her albuterol inhaler. She c/o shakiness in her  hands for the past couple of months.    less cough p augmentin but still am sputum green / no noct symptoms Doing well with rehab but sometimes has to turn 02 up to 4 lpm there but doesn't do so in other settings and concerned about wt gain  rec Start plaquenil 200 mg daily  Ok to adjust the prednisone 41m  from 1-2 pills in am as per med calendar See calendar for specific medication instructions    01/10/2018  f/u ov/[name] re:  Sarcoid/ plaquenil 200 mg daily plus pred 5 mg daily / last time off was one year prior/ 02 = 2lpm floor and 4lpm with ex Chief Complaint  Patient presents with  . Follow-up    repeat LFT's today. She states she has noticed some shaking in her hands for a while now and forgot to mention this at last ov. Her breathing is doing well and she is down to 5 mg pred.   Dyspnea:  Ex bike x 15-30 min / low resistance and 02 up to 3-4lpm sats in mid 90s Cough: no Sleep: on 2lpm / one pillow  SABA use:  Not  needing  rec Try prednisone 5 mg  Reduce to one half daily to see if not doing as well and if not increase prednisone to 5 mg and call me for higher dose of plaquenil  Late add: with esr high and lfts ok rec bid plaquenil and return in one month for pfts/lfts> could not tol 400 mg daily due to n and v but ok on 200 mg daily      05/21/2018  f/u ov/[name] re:  codp GOLD IV/ 02 dep on pred 5 mg x 2  daily for sarcoid / intol of plaquenil > 200 mg (N/V) Using med cal well  Chief Complaint  Patient presents with  . Follow-up    Breathing unchanged since the last visit. She has prod cough in the am's with min green sputum. She is using her albuterol inhaler 1 x per wk on average.   Dyspnea:  On 3-4 lpm does [employer] / also able to do ex bike x 15 min  Cough: just in am , slt discolored mucus  SABA use: rare, usually if goes out in hear  02: 2lpm hs and 3-4 lpm daytime   rec Please see patient coordinator before you leave today  to schedule referral to dermatology  No change  in medications except use prednisone 5 mg  X 2 when breathing/ coughing worse then 1 daily thereafter Follow the medication calendar we reviewed today    11/22/2018  f/u ov/[name] re:  Sarcooid/ copd/ 02 dep resp failure/ indolent onset x 3  weeks no appetite fatigue  Increased sob on pred 5 mg daily / no med calendar  Chief Complaint  Patient presents with  . Follow-up    Pt c/o increased SOB and cough the past few days. Her cough is prod at times with yellow to clear sputum. She states her mouth is very dry in the mornings. Her appetite is poor and she is down 9 lbs since the last visit. She is using her albuterol inhaler 2 x per wk on average.    Dyspnea:  mb and back flat ok on 3lpm Pulsed when walks  Cough: none Sleeping: on side bed is horizontal SABA use: couple of times a week 02: 2lpm and up to 3lpm walk rec Please remember to go to the lab department   for your tests - we will call you with the results when they are available. Take 20 prednisone daily until better then 10 mg per day and w/in 3 days got worse more /sob/ decreased sats so admit 12/17/18   Admit date: 12/17/2018 Discharge date: 12/22/2018    DISCHARGE DIAGNOSES:  Multifocal pneumonia likely community-acquired Acute COPD exacerbation, improved Acute on chronic respiratory failure with hypoxia History of sarcoidosis on chronic steroids Acute kidney injury, resolved Essential hypertension History of breast cancer   12/30/2018  f/u ov/[name] re: post hosp eval / sarcoid on plaquenil and floor of pred 5 mg daily/ 02 2lpm - 3lpm with activity / copd on stiolto Chief Complaint  Patient presents with  . Hospitalization Follow-up    Breathing has improved almost back to her normal baseline. She is coughing with minimal clear sputum. She has been using her albuterol inhaler about once per wk and she has used neb x 3 since hospital d/c.    Dyspnea:  MMR[facility] = can't walk 100 yards even at a slow pace at a flat grade s stopping  due to sob   Cough: minimal clear  Sleeping: bed is flat/  props up on 2 pillows SABA use: as abov3 02:  2lpm 24/7    No obvious day to day or daytime variability or assoc excess/ purulent sputum or mucus plugs or hemoptysis or cp or chest tightness, subjective wheeze or overt sinus or hb symptoms.   sleeping without nocturnal  or early am exacerbation  of respiratory  c/o's or need for noct saba. Also denies any obvious fluctuation of symptoms with weather or environmental changes or other aggravating or alleviating factors except as outlined above   No unusual exposure hx or h/o childhood pna/ asthma or knowledge of premature birth.  Current Allergies, Complete Past Medical History, Past Surgical History, Family History, and Social History were reviewed in [employer] record.  ROS  The following are not active complaints unless bolded Hoarseness, sore throat, dysphagia, dental problems, itching, sneezing,  nasal congestion or discharge of excess mucus or purulent secretions, ear ache,   fever, chills, sweats, unintended wt loss or wt gain, classically pleuritic or exertional cp,  orthopnea pnd or arm/hand swelling  or leg swelling, presyncope, palpitations, abdominal pain, anorexia, nausea, vomiting, diarrhea  or change in bowel habits or change in bladder habits, change in stools or change in urine, dysuria, hematuria,  rash, arthralgias, visual complaints, headache, numbness, weakness or ataxia or problems with walking or coordination,  change in mood or  memory.        Current Meds  Medication Sig  . albuterol (PROVENTIL) (2.5 MG/3ML) 0.083% nebulizer solution Take 3 mLs (2.5 mg total) by nebulization every 6 (six) hours as needed for wheezing or shortness of breath.  Marland Kitchen albuterol (VENTOLIN HFA) 108 (90 Base) MCG/ACT inhaler Inhale 2 puffs into the lungs every 6 (six) hours as needed for wheezing or shortness of breath.  . anastrozole (ARIMIDEX) 1 MG tablet Take 1  tablet (1 mg total) by mouth daily.  Marland Kitchen aspirin EC 81 MG tablet Take 81 mg by mouth daily.  Marland Kitchen atorvastatin (LIPITOR) 20 MG tablet Take 1 tablet (20 mg total) by mouth daily.  Marland Kitchen erythromycin ophthalmic ointment Place 1 application into both eyes at bedtime.  . fluticasone (FLONASE) 50 MCG/ACT nasal spray Place 2 sprays into both nostrils daily as needed for allergies.  Marland Kitchen gabapentin (NEURONTIN) 300 MG capsule Take 2 capsules (600 mg total) by mouth at bedtime. 2 at bedtime  . Guaifenesin (MUCINEX MAXIMUM STRENGTH) 1200 MG TB12 Take 1,200 mg by mouth 2 (two) times daily as needed.  . hydroxychloroquine (PLAQUENIL) 200 MG tablet Take 1 tablet (200 mg total) by mouth daily.  . metoprolol tartrate (LOPRESSOR) 25 MG tablet Take 1 tablet (25 mg total) by mouth 2 (two) times daily.  . Multiple Vitamin (MULTIVITAMIN) capsule Take 1 capsule by mouth daily.  Marland Kitchen omeprazole (PRILOSEC) 20 MG capsule TAKE 1 CAPSULE BY MOUTH ONCE DAILY  . OXYGEN 2lpm with rest and 3-4 lpm with exertion  . predniSONE (DELTASONE) 20 MG tablet Take 3 tablets orally daily for 3 days, then 2 tablets daily for 3 days, then 1 tablet daily for 3 days, then the usual dose of prednisone.  . senna (SENOKOT) 8.6 MG TABS tablet Take 1 tablet (8.6 mg total) by mouth 2 (two) times daily.  . sertraline (ZOLOFT) 50 MG tablet Take 1 tablet (50 mg total) by mouth daily.  Marland Kitchen STIOLTO RESPIMAT 2.5-2.5 MCG/ACT AERS INHALE 2 PUFFS INTO THE LUNGS DAILY (Patient taking differently: Inhale 2 puffs into the lungs daily. )  . 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 Unice Cobble  -h/o Daily prednisone since 2007   > off completely Feb 2012 with no problem> restarted  09/2016  ?  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 Some better but no worse off 04/2011 - Rehab started mid aug 2018  BACK PAIN, LOW (ICD-724.2)  ANEMIA, IRON DEFICIENCY, UNSPEC. (ICD-280.9)  Health Maintenance.......................................................   [facility]   Family History:  crohn`s in daughter, M-lupus, htn, asthma,  no Ca, DM, CAD    Social History:   lives with twin children and grandson. single; >20 pack year history, quit in 2005; no EtOH  Laid off  from works for [employer] Sept 2013           Objective:   Physical Exam wt 166 Oct 12, 2009 >170 October 28, 2010 > 169 06/29/2011 >>171 08/24/2011 > 01/15/2012  171 > 04/24/2012 176 > 06/28/2012  175> 08/13/2012 > 08/30/2012  173 > 170 11/28/2012 > 01/29/2013  176 > 174 >173 06/16/2013 > 171 09/18/2013 > 12/19/2013 177 >179 04/13/2014>  04/23/2014 177 > 08/21/2014 184 >182 11/27/2014 > 05/12/2015 177  > 05/11/2016   177 > 10/27/2016  175 > 11/09/2016 182 > 01/09/2017 198 > 02/20/2017  206 >  07/17/2017   209 >  09/03/2017   210 > 09/28/2017  206  > 11/26/2017   209 > 01/10/2018  210> 02/19/2018  206> 05/21/2018  198 > 08/06/2018  197 > 08/21/2018  196 > 11/22/2018  187 > 12/30/2018  188   amb bf nad    Vital signs reviewed - Note on arrival 02 sats  97% on 2lpm poc       HEENT: nl dentition / oropharynx. Nl external ear canals without cough reflex -  Mild bilateral non-specific turbinate edema     NECK :  without JVD/Nodes/TM/ nl carotid upstrokes bilaterally   LUNGS: no acc muscle use,  Mild barrel  contour chest wall with bilateral  Distant bs s audible wheeze and   without cough on insp or exp maneuver and mild  Hyperresonant  to  percussion bilaterally     CV:  RRR  no s3 or murmur or increase in P2, and no edema   ABD:  soft and nontender with pos late  insp Hoover's  in the supine position. No bruits or organomegaly appreciated, bowel sounds nl  MS:   Nl gait/  ext warm without deformities, calf tenderness, cyanosis or clubbing No obvious joint restrictions   SKIN: warm and dry without lesions    NEURO:  alert, approp, nl sensorium with  no motor or cerebellar deficits apparent.          CXR PA and Lateral:   12/30/2018 :    I personally reviewed images and   impression as follows:  Marked improvement in acute changes/ back to baseline bilateral coarse changes              Assessment:

## 2018-12-30 NOTE — Telephone Encounter (Signed)
Contacted pt and appointment scheduled for 01/10/2019. Katharina Caper, April D, [place]

## 2018-12-30 NOTE — Patient Instructions (Signed)
Prednisone 10 mg 2 daily until better, then 1daily   Please remember to go to the  x-ray department  for your tests - we will call you with the results when they are available    Keep previous appt

## 2018-12-31 ENCOUNTER — Encounter: Payer: Self-pay | Admitting: Internal Medicine

## 2018-12-31 NOTE — Progress Notes (Signed)
Spoke with pt and notified of results per Dr. Wert. Pt verbalized understanding and denied any questions. 

## 2018-12-31 NOTE — Assessment & Plan Note (Signed)
New start 09/2016 at admit see records - 10/18/16 Patient Saturations on Room Air at Rest = 92% Patient Saturations on Room Air while Ambulating = 84% Patient Saturations on 2 Liters of oxygen while Ambulating = 90% - HCO3  11/09/2016 = 39  - HCO3   03/16/17        = 34  - HC03    01/10/2018  = 40  - HC03  11/22/2018     = 38  - HC03  12/20/2018       = 26   As of  12/30/2018  rec 2lpm sleeping and titrate with ambulation to maintain > 90%    I had an extended discussion with the patient reviewing all relevant studies completed to date and  lasting 15 to 20 minutes of a 25 minute post hosp f/u office visit    See device teaching which extended face to face time for this visit.  Each maintenance medication was reviewed in detail including emphasizing most importantly the difference between maintenance and prns and under what circumstances the prns are to be triggered using an action plan format that is not reflected in the computer generated alphabetically organized AVS which I have not found useful in most complex patients, especially with respiratory illnesses  Please see AVS for specific instructions unique to this visit that I personally wrote and verbalized to the the pt in detail and then reviewed with pt  by my nurse highlighting any  changes in therapy recommended at today's visit to their plan of care.

## 2018-12-31 NOTE — Assessment & Plan Note (Addendum)
Quit smoking 2005  - PFT's 06/26/08 FEV1 1.09 ratio  - PFTs 03/02/10 FEV1 1.06 ratio 34    PFT's 08/01/2011 FEV1 1.6 (42%)  41 ratio  DLCO 55 corrects 77 - Spiriva trial March 02, 2010 > ? better but not worse off it 04/2011 - 05/12/2015 p extensive coaching HFA effectiveness =    90%  - Referred to Rehab 08/21/2014 > could not arrange due to schedule  -med calendar 06/16/2013 > did not bring to office as requested 12/19/13 or 04/23/14  Or 08/21/2014  - 11/09/2016    changed symb to bevespi since taking chronic pred for pf    - 02/20/2017 decreased pred to 5 mg daily > weaned to 2.5 mg qd as of 07/17/2017  - started rehab mid Aug 2018  - 07/17/2017 changed to pred 2.5 even days > did not tolerate so changed  Around  07/28/17 back to 5 mg qod  - Spirometry 09/03/2017  FEV1 0.52 (23%)  Ratio 40 with typical curvature   - PFT's  02/19/2018  FEV1 0.65 (29 % ) ratio 43  p 1 % improvement from saba p stiolto/ pred 5 mg  prior to study with DLCO  39 % corrects to 56  % for alv volume - 12/30/2018  After extensive coaching inhaler device,  effectiveness =    95%   With smi > continue stiolto     Group D in terms of symptom/risk and laba/lama/ICS  therefore appropriate rx at this point >>>  Continue stiolto and daily pred since needs the systemic dose anyway for sarcoid and likely iatrogenic adrenal insuff from HPA chronic suppression . The goal with a chronic steroid dependent illness is always arriving at the lowest effective dose that controls the disease/symptoms and not accepting a set "formula" which is based on statistics or guidelines that don't always take into account patient  variability or the natural hx of the dz in every individual patient, which may well vary over time.  For now therefore I recommend the patient maintain  Ceiling of 20 and a floor of 10 mg daily

## 2018-12-31 NOTE — Assessment & Plan Note (Signed)
Positive transbronchial biopsy 02/19/91 by Dr. Unice Cobble  -Daily prednisone since 2007 with ? adrenal insufficiency iatrogenic > off completely Feb 2012 with no recurrence - PFT's 06/26/08 FEV1  1.09 ratio  - PFTs  03/02/10 FEV1  1.06 (42%) ratio 34 and no better p B2,  DLC0 48% corrects to 64% - PFT's 08/01/2011 FEV1 1.6 (42%)  41 ratio  DLCO 55 corrects 77 - PFTs 10/10/16    FEV1  0.87(38)ratio 41 and DLCO 36/39c corrects to 51 for alv vol > placed back on daily pred  - 02/20/2017 decreased pred to 5 mg daily > to 2.65m daily as of 07/17/2017  - 07/17/2017 try 2.5 mg qod > 09/28/2017 changed to 20 mg until better and new floor of 5 mg daily plus consider plaquenil due to new skin involvement on face  - opth eval neg 11/10/17 - Start plaquenil 200 mg daily 11/26/2017  - 01/10/2018 ESR up to 97 with pred taper so rec bid plaquenil > could only tol 200 mg daily  - 02/19/2018 ESR = 94 on 513mdaily  - 05/21/2018 referred to dermatology  - 08/21/2018   ESR = 80  On prednisone 10 mg/ plaquenil 200 mg daily > tapered to floor of 5 mg daily  - 11/22/2018  ESR  = 113 on pred 5 mg and plaq 200 mg with calcium 12.1 so rec pred 20 mg ub then new floor of 10 mg daily     Continue floor dose of 10 mg daily

## 2019-01-10 ENCOUNTER — Encounter: Payer: Self-pay | Admitting: Family Medicine

## 2019-01-10 ENCOUNTER — Ambulatory Visit (INDEPENDENT_AMBULATORY_CARE_PROVIDER_SITE_OTHER): Payer: Self-pay | Admitting: Family Medicine

## 2019-01-10 ENCOUNTER — Other Ambulatory Visit: Payer: Self-pay

## 2019-01-10 VITALS — BP 118/62 | HR 92 | Temp 98.5°F | Ht 65.0 in | Wt 189.8 lb

## 2019-01-10 DIAGNOSIS — R05 Cough: Secondary | ICD-10-CM

## 2019-01-10 DIAGNOSIS — D638 Anemia in other chronic diseases classified elsewhere: Secondary | ICD-10-CM | POA: Insufficient documentation

## 2019-01-10 DIAGNOSIS — F32A Depression, unspecified: Secondary | ICD-10-CM

## 2019-01-10 DIAGNOSIS — D649 Anemia, unspecified: Secondary | ICD-10-CM

## 2019-01-10 DIAGNOSIS — F329 Major depressive disorder, single episode, unspecified: Secondary | ICD-10-CM

## 2019-01-10 DIAGNOSIS — R059 Cough, unspecified: Secondary | ICD-10-CM | POA: Insufficient documentation

## 2019-01-10 DIAGNOSIS — R7309 Other abnormal glucose: Secondary | ICD-10-CM

## 2019-01-10 LAB — POCT INFLUENZA A/B
Influenza A, POC: NEGATIVE
Influenza B, POC: NEGATIVE

## 2019-01-10 NOTE — Assessment & Plan Note (Signed)
Patient has had several point glucose checks in the abnormal range.  Patient is on chronic steroids, with increasing doses recently.  Will check A1c for possible diabetes.

## 2019-01-10 NOTE — Progress Notes (Signed)
   [facility] Phone: [phone]   cc: Hospital follow-up, pneumonia,  Subjective:  Recent pneumonia: Patient was recently discharged from the hospital for pneumonia.  She finished her course of antibiotics.  She is still taking prednisone 20 mg, per Dr. Shyrl Numbers her pulmonologist who is treating her for COPD, sarcoidosis.  Patient states she has been using supplemental oxygen during the day since November.  She states she did not have a cough before she had pneumonia.  She has had a cough since returning from the hospital.  She states this is sometimes productive of sputum that was initially clear and has become more yellowish.  She states that her grandson who lives with her has had the flu. she denies fevers.  Sometimes she has night sweats.  She states she has had hot flashes since she was 52.  She states she has been fatigued, but that she felt this way before she was hospitalized for pneumonia.  She is currently taking Stiolto and albuterol for her COPD and prednisone and Plaquenil for her sarcoidosis    Depression: Patient is on Zoloft.  She states she feels good on this.  She states she does not let things bother her like she used to.   ROS: See HPI for pertinent positives and negatives  Past Medical History  Family history reviewed for today's visit. No changes.  Social history- patient is a non smoker.  2005.    Objective: BP 118/62   Pulse 92   Temp 98.5 F (36.9 C) (Oral)   Ht 5\' 5"  (1.651 m)   Wt 189 lb 12.8 oz (86.1 kg)   SpO2 92%   BMI 31.58 kg/m  Gen: NAD, alert and oriented, cooperative with exam CV: normal rate, regular rhythm. No murmurs, no rubs.  Resp: Patient had a significant cough with productive sputum.  I did not appreciate any crackles or wheezing on lung exam.  Patient had portable O2 machine set to 2 L. Psych: Appropriate behavior.  Pleasant affect.  Makes eye contact.  Assessment/Plan: Cough Patient still retains cough from  recent pneumonia.  She is on 20 mg prednisone, per pulmonologist recommendations.  Patient states she lives with grandchild who was recently diagnosed with flu.  Flu swab in clinic was negative.  Given patient's sarcoidosis, COPD, and recent multifocal pneumonia she will likely have a prolonged recovery period.  Pulmonologist is managing her respiratory illness.  Anemia Patient was noted to have hemoglobin between 8 and 9 during her hospital admission.  Upon review, she has had consistent low hemoglobins in the past year.  Patient has been complaining of fatigue since before her pneumonia.  Anemia panel was ordered.  Other abnormal glucose Patient has had several point glucose checks in the abnormal range.  Patient is on chronic steroids, with increasing doses recently.  Will check A1c for possible diabetes.  Depression Patient states she is doing well on current dose of Zoloft.  She did not wish to fill out the PHQ 9.    Clemetine Marker, MD PGY-1

## 2019-01-10 NOTE — Assessment & Plan Note (Addendum)
Patient was noted to have hemoglobin between 8 and 9 during her hospital admission.  Upon review, she has had consistent low hemoglobins in the past year.  Patient has been complaining of fatigue since before her pneumonia.  Anemia panel was ordered.

## 2019-01-10 NOTE — Assessment & Plan Note (Signed)
Patient still retains cough from recent pneumonia.  She is on 20 mg prednisone, per pulmonologist recommendations.  Patient states she lives with grandchild who was recently diagnosed with flu.  Flu swab in clinic was negative.  Given patient's sarcoidosis, COPD, and recent multifocal pneumonia she will likely have a prolonged recovery period.  Pulmonologist is managing her respiratory illness.

## 2019-01-10 NOTE — Patient Instructions (Addendum)
It was great to see you again today,  Your cough should continue to improve.  It may take longer given that you have sarcoidosis and COPD on top of the recent pneumonia.  We have checked you for the flu.  If it is positive you can take Tamiflu for 5 days.  I have also checked your blood for anemia, because you had low hemoglobin during your last hospital admission.  I have also checked your A1c which which to test for diabetes.  I would like to see you again in 3 months.  Have a great day,  Addison Naegeli, MD

## 2019-01-10 NOTE — Assessment & Plan Note (Signed)
Patient states she is doing well on current dose of Zoloft.  She did not wish to fill out the PHQ 9.

## 2019-01-11 ENCOUNTER — Encounter: Payer: Self-pay | Admitting: Gastroenterology

## 2019-01-11 LAB — BASIC METABOLIC PANEL
BUN / CREAT RATIO: 18 (ref 9–23)
BUN: 21 mg/dL (ref 6–24)
CO2: 30 mmol/L — ABNORMAL HIGH (ref 20–29)
CREATININE: 1.19 mg/dL — AB (ref 0.57–1.00)
Calcium: 9.1 mg/dL (ref 8.7–10.2)
Chloride: 100 mmol/L (ref 96–106)
GFR calc Af Amer: 58 mL/min/{1.73_m2} — ABNORMAL LOW (ref >59)
GFR calc non Af Amer: 50 mL/min/{1.73_m2} — ABNORMAL LOW (ref >59)
Glucose: 97 mg/dL (ref 65–99)
Potassium: 4.1 mmol/L (ref 3.5–5.2)
SODIUM: 146 mmol/L — AB (ref 134–144)

## 2019-01-13 LAB — ANEMIA PANEL
Ferritin: 187 ng/mL — ABNORMAL HIGH (ref 15–150)
Folate, Hemolysate: 447 ng/mL
Folate, RBC: 1500 ng/mL (ref >498)
Hematocrit: 29.8 % — ABNORMAL LOW (ref 34.0–46.6)
Iron Saturation: 26 % (ref 15–55)
Iron: 60 ug/dL (ref 27–159)
RETIC CT PCT: 1.9 % (ref 0.6–2.6)
Total Iron Binding Capacity: 229 ug/dL — ABNORMAL LOW (ref 250–450)
UIBC: 169 ug/dL (ref 131–425)
Vitamin B-12: 650 pg/mL (ref 232–1245)

## 2019-01-14 ENCOUNTER — Encounter: Payer: Self-pay | Admitting: Gastroenterology

## 2019-01-22 ENCOUNTER — Telehealth: Payer: Self-pay

## 2019-01-22 NOTE — Telephone Encounter (Signed)
Called pt and left message to call our ofiice regarding if she was on O2.? She would need to schedule and OV. [name] in [facility]

## 2019-01-23 NOTE — Telephone Encounter (Signed)
Patient made office visit with Dennison Bulla for 02/04/2019. PV cancelled.

## 2019-01-27 ENCOUNTER — Telehealth: Payer: Self-pay | Admitting: Internal Medicine

## 2019-01-27 MED ORDER — TIOTROPIUM BROMIDE-OLODATEROL 2.5-2.5 MCG/ACT IN AERS: 2.0000 | INHALATION_SPRAY | Freq: Every day | RESPIRATORY_TRACT | 0 refills | Status: DC

## 2019-01-27 MED ORDER — DOXYCYCLINE HYCLATE 100 MG PO TABS: 100.0000 mg | ORAL_TABLET | Freq: Two times a day (BID) | ORAL | 0 refills | Status: DC

## 2019-01-27 NOTE — Telephone Encounter (Signed)
Primary Pulmonologist: Wert Last office visit and with whom: Wert, 12/30/18 What do we see them for (pulmonary problems): COPD Last OV assessment/plan:  Instructions   Prednisone 10 mg 2 daily until better, then 1daily   Please remember to go to the  x-ray department  for your tests - we will call you with the results when they are available    Keep previous appt         Was appointment offered to patient (explain)?  No per office protocol   Reason for call:  Called and spoke with Patient.  Patient stated that she is still having chest congestion, productive cough, with yellow phlegm. Patient denies fever, chills, or travel.  Patient stated she has been using her inhalers, nebs, and mucinex as recommended by Dr Melvyn Novas. Patient request any prescriptions to be sent to [facility].   Patient requested stiolto sample.  Sample placed at front desk for pick up.

## 2019-01-27 NOTE — Addendum Note (Signed)
Addended by: Elton Sin on: 01/27/2019 04:51 PM   Modules accepted: Orders

## 2019-01-27 NOTE — Telephone Encounter (Signed)
Called and spoke with Patient.  Patient denied fever, travel, or being around anyone sick.  Tammy P, NP, recommendations given. Understanding stated. Doxycycline prescription sent to requested pharmacy, [facility].  Nothing further at this time.

## 2019-01-27 NOTE — Telephone Encounter (Signed)
Please verify no travel or fever. Sick contacts.    Doxycycline 100mg  Twice daily  For 7days #14 no refills   Continue with ov recs from last ov  Fluids and rest   Social distancing .   If symptoms not improving will need ov   Please contact office for sooner follow up if symptoms do not improve or worsen or seek emergency care

## 2019-02-04 ENCOUNTER — Ambulatory Visit: Payer: Self-pay | Admitting: Physician Assistant

## 2019-02-06 ENCOUNTER — Inpatient Hospital Stay: Payer: BLUE CROSS/BLUE SHIELD | Attending: Hematology and Oncology | Admitting: Hematology and Oncology

## 2019-02-06 DIAGNOSIS — C50411 Malignant neoplasm of upper-outer quadrant of right female breast: Secondary | ICD-10-CM

## 2019-02-06 DIAGNOSIS — Z923 Personal history of irradiation: Secondary | ICD-10-CM | POA: Diagnosis not present

## 2019-02-06 DIAGNOSIS — Z79811 Long term (current) use of aromatase inhibitors: Secondary | ICD-10-CM

## 2019-02-06 DIAGNOSIS — Z170 Estrogen receptor positive status [ER+]: Secondary | ICD-10-CM

## 2019-02-06 MED ORDER — ANASTROZOLE 1 MG PO TABS: 1.0000 mg | ORAL_TABLET | Freq: Every day | ORAL | 3 refills | Status: DC

## 2019-02-06 NOTE — Assessment & Plan Note (Signed)
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  This has controlled her hot flashes very well.  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 Breast exam 02/06/2017: Normal 2. Mammogram March 2020: Benign breast density category B  Return to clinic in 1 year for follow-up

## 2019-02-06 NOTE — Progress Notes (Signed)
HEMATOLOGY-ONCOLOGY TELEPHONE VISIT PROGRESS NOTE  I connected with [name] on 02/06/19 at  2:00 PM EDT by telephone and verified that I am speaking with the correct person using two identifiers.  I discussed the limitations, risks, security and privacy concerns of performing an evaluation and management service by telephone and the availability of in person appointments.  I also discussed with the patient that there may be a patient responsible charge related to this service. The patient expressed understanding and agreed to proceed.   History of Present Illness: Recent hospitalization for pneumonia, recovering from the bronchitis.  She is tolerating anastrozole extremely well    Breast cancer of upper-outer quadrant of right female breast ([place])   07/08/2015 Mammogram    Right breast calcifications 11 mm span    08/04/2015 Initial Diagnosis    Biopsy right breast: Invasive ductal carcinoma with DCIS with calcifications, lymphovascular invasion is identified, grade 2-3, ER 100%, PR 100% Ki-67 20%, HER-2 negative ratio 1.46    08/04/2015 Clinical Stage    Stage IA: T1b N0    08/18/2015 Definitive Surgery    Right  lumpectomy: invasive ductal carcinoma, grade 2, 0.6 cm, with DCIS high-grade, margins negative, 0/2 lymph nodes negative, ER 100%, PR positive, HER-2 negative, Ki-67 20%    08/18/2015 Pathologic Stage    Stage IA: T1b N0    08/18/2015 Oncotype testing    Insufficient tissue to perform    09/21/2015 - 10/20/2015 Radiation Therapy    Adjuvant radiation therapy: 1) Right Breast / 40.05 Gy in 15 fractions 2) Right breast boost / 10 Gy in 5 fractions    10/08/2015 - 10/11/2015 Hospital Admission    cellulitis of the right breast    11/16/2015 -  Anti-estrogen oral therapy    Anastrozole 1 mg daily once hot flashes better controlled. Planned duration of therapy 5 years.      Observations/Objective: Denies any breast pain discomfort or lumps or nodules. Continues to have hot  flashes for which she takes Neurontin which appears to be helping her somewhat.   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  This has controlled her hot flashes very well.  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 Breast exam 02/06/2017: Normal 2. Mammogram March 2020: Benign breast density category B  Return to clinic in 1 year for follow-up    I discussed the assessment and treatment plan with the patient. The patient was provided an opportunity to ask questions and all were answered. The patient agreed with the plan and demonstrated an understanding of the instructions. The patient was advised to call back or seek an in-person evaluation if the symptoms worsen or if the condition fails to improve as anticipated.   I provided 15 minutes of non-face-to-face time during this encounter. Harriette Ohara, MD

## 2019-02-11 ENCOUNTER — Other Ambulatory Visit: Payer: Self-pay

## 2019-02-12 MED ORDER — ATORVASTATIN CALCIUM 20 MG PO TABS: 20.0000 mg | ORAL_TABLET | Freq: Every day | ORAL | 3 refills | Status: DC

## 2019-02-13 ENCOUNTER — Encounter: Payer: Self-pay | Admitting: Gastroenterology

## 2019-02-18 ENCOUNTER — Other Ambulatory Visit: Payer: Self-pay | Admitting: Internal Medicine

## 2019-02-18 DIAGNOSIS — D869 Sarcoidosis, unspecified: Secondary | ICD-10-CM

## 2019-02-19 ENCOUNTER — Other Ambulatory Visit: Payer: Self-pay

## 2019-02-19 ENCOUNTER — Emergency Department (HOSPITAL_COMMUNITY): Payer: BLUE CROSS/BLUE SHIELD

## 2019-02-19 ENCOUNTER — Encounter (HOSPITAL_COMMUNITY): Payer: Self-pay

## 2019-02-19 ENCOUNTER — Observation Stay (HOSPITAL_COMMUNITY)
Admission: EM | Admit: 2019-02-19 | Discharge: 2019-02-21 | Disposition: A | Discharge status: Home / Self Care | Payer: BLUE CROSS/BLUE SHIELD | Attending: Internal Medicine | Admitting: Internal Medicine

## 2019-02-19 DIAGNOSIS — K648 Other hemorrhoids: Secondary | ICD-10-CM | POA: Diagnosis not present

## 2019-02-19 DIAGNOSIS — Z791 Long term (current) use of non-steroidal anti-inflammatories (NSAID): Secondary | ICD-10-CM | POA: Insufficient documentation

## 2019-02-19 DIAGNOSIS — N179 Acute kidney failure, unspecified: Secondary | ICD-10-CM | POA: Insufficient documentation

## 2019-02-19 DIAGNOSIS — D638 Anemia in other chronic diseases classified elsewhere: Secondary | ICD-10-CM | POA: Diagnosis present

## 2019-02-19 DIAGNOSIS — D649 Anemia, unspecified: Secondary | ICD-10-CM | POA: Diagnosis not present

## 2019-02-19 DIAGNOSIS — N200 Calculus of kidney: Secondary | ICD-10-CM | POA: Insufficient documentation

## 2019-02-19 DIAGNOSIS — D573 Sickle-cell trait: Secondary | ICD-10-CM | POA: Diagnosis not present

## 2019-02-19 DIAGNOSIS — I700 Atherosclerosis of aorta: Secondary | ICD-10-CM | POA: Diagnosis not present

## 2019-02-19 DIAGNOSIS — K219 Gastro-esophageal reflux disease without esophagitis: Secondary | ICD-10-CM | POA: Diagnosis present

## 2019-02-19 DIAGNOSIS — K922 Gastrointestinal hemorrhage, unspecified: Principal | ICD-10-CM | POA: Insufficient documentation

## 2019-02-19 DIAGNOSIS — J449 Chronic obstructive pulmonary disease, unspecified: Secondary | ICD-10-CM | POA: Diagnosis present

## 2019-02-19 DIAGNOSIS — Z8249 Family history of ischemic heart disease and other diseases of the circulatory system: Secondary | ICD-10-CM | POA: Insufficient documentation

## 2019-02-19 DIAGNOSIS — Z7952 Long term (current) use of systemic steroids: Secondary | ICD-10-CM | POA: Insufficient documentation

## 2019-02-19 DIAGNOSIS — Z79899 Other long term (current) drug therapy: Secondary | ICD-10-CM | POA: Insufficient documentation

## 2019-02-19 DIAGNOSIS — K5730 Diverticulosis of large intestine without perforation or abscess without bleeding: Secondary | ICD-10-CM | POA: Insufficient documentation

## 2019-02-19 DIAGNOSIS — Z885 Allergy status to narcotic agent status: Secondary | ICD-10-CM | POA: Insufficient documentation

## 2019-02-19 DIAGNOSIS — Z9981 Dependence on supplemental oxygen: Secondary | ICD-10-CM | POA: Diagnosis not present

## 2019-02-19 DIAGNOSIS — E669 Obesity, unspecified: Secondary | ICD-10-CM | POA: Insufficient documentation

## 2019-02-19 DIAGNOSIS — F329 Major depressive disorder, single episode, unspecified: Secondary | ICD-10-CM | POA: Insufficient documentation

## 2019-02-19 DIAGNOSIS — I708 Atherosclerosis of other arteries: Secondary | ICD-10-CM | POA: Diagnosis not present

## 2019-02-19 DIAGNOSIS — Z825 Family history of asthma and other chronic lower respiratory diseases: Secondary | ICD-10-CM | POA: Insufficient documentation

## 2019-02-19 DIAGNOSIS — Z87891 Personal history of nicotine dependence: Secondary | ICD-10-CM | POA: Insufficient documentation

## 2019-02-19 DIAGNOSIS — K429 Umbilical hernia without obstruction or gangrene: Secondary | ICD-10-CM | POA: Insufficient documentation

## 2019-02-19 DIAGNOSIS — Z6833 Body mass index (BMI) 33.0-33.9, adult: Secondary | ICD-10-CM | POA: Insufficient documentation

## 2019-02-19 DIAGNOSIS — J9611 Chronic respiratory failure with hypoxia: Secondary | ICD-10-CM | POA: Insufficient documentation

## 2019-02-19 DIAGNOSIS — Z8489 Family history of other specified conditions: Secondary | ICD-10-CM | POA: Insufficient documentation

## 2019-02-19 DIAGNOSIS — D869 Sarcoidosis, unspecified: Secondary | ICD-10-CM | POA: Diagnosis not present

## 2019-02-19 DIAGNOSIS — Z90710 Acquired absence of both cervix and uterus: Secondary | ICD-10-CM | POA: Insufficient documentation

## 2019-02-19 DIAGNOSIS — D122 Benign neoplasm of ascending colon: Secondary | ICD-10-CM | POA: Diagnosis not present

## 2019-02-19 DIAGNOSIS — C50411 Malignant neoplasm of upper-outer quadrant of right female breast: Secondary | ICD-10-CM | POA: Diagnosis not present

## 2019-02-19 DIAGNOSIS — J9612 Chronic respiratory failure with hypercapnia: Secondary | ICD-10-CM | POA: Insufficient documentation

## 2019-02-19 DIAGNOSIS — D62 Acute posthemorrhagic anemia: Secondary | ICD-10-CM | POA: Diagnosis not present

## 2019-02-19 DIAGNOSIS — F419 Anxiety disorder, unspecified: Secondary | ICD-10-CM | POA: Insufficient documentation

## 2019-02-19 DIAGNOSIS — Z7982 Long term (current) use of aspirin: Secondary | ICD-10-CM | POA: Insufficient documentation

## 2019-02-19 DIAGNOSIS — I10 Essential (primary) hypertension: Secondary | ICD-10-CM | POA: Diagnosis not present

## 2019-02-19 DIAGNOSIS — Z803 Family history of malignant neoplasm of breast: Secondary | ICD-10-CM | POA: Insufficient documentation

## 2019-02-19 DIAGNOSIS — Z7951 Long term (current) use of inhaled steroids: Secondary | ICD-10-CM | POA: Insufficient documentation

## 2019-02-19 LAB — COMPREHENSIVE METABOLIC PANEL
ALT: 15 U/L (ref 0–44)
AST: 17 U/L (ref 15–41)
Albumin: 3.8 g/dL (ref 3.5–5.0)
Alkaline Phosphatase: 73 U/L (ref 38–126)
Anion gap: 7 (ref 5–15)
BUN: 16 mg/dL (ref 6–20)
CO2: 30 mmol/L (ref 22–32)
Calcium: 9.1 mg/dL (ref 8.9–10.3)
Chloride: 106 mmol/L (ref 98–111)
Creatinine, Ser: 1.12 mg/dL — ABNORMAL HIGH (ref 0.44–1.00)
GFR calc Af Amer: >60 mL/min (ref >60)
GFR calc non Af Amer: 54 mL/min — ABNORMAL LOW (ref >60)
Glucose, Bld: 139 mg/dL — ABNORMAL HIGH (ref 70–99)
Potassium: 3.8 mmol/L (ref 3.5–5.1)
Sodium: 143 mmol/L (ref 135–145)
Total Bilirubin: 0.4 mg/dL (ref 0.3–1.2)
Total Protein: 7.3 g/dL (ref 6.5–8.1)

## 2019-02-19 LAB — CBC WITH DIFFERENTIAL/PLATELET
Abs Immature Granulocytes: 0.07 10*3/uL (ref 0.00–0.07)
Basophils Absolute: 0 10*3/uL (ref 0.0–0.1)
Basophils Relative: 0 %
Eosinophils Absolute: 0.1 10*3/uL (ref 0.0–0.5)
Eosinophils Relative: 1 %
HCT: 29.6 % — ABNORMAL LOW (ref 36.0–46.0)
Hemoglobin: 9.1 g/dL — ABNORMAL LOW (ref 12.0–15.0)
Immature Granulocytes: 1 %
Lymphocytes Relative: 7 %
Lymphs Abs: 0.7 10*3/uL (ref 0.7–4.0)
MCH: 27 pg (ref 26.0–34.0)
MCHC: 30.7 g/dL (ref 30.0–36.0)
MCV: 87.8 fL (ref 80.0–100.0)
Monocytes Absolute: 0.5 10*3/uL (ref 0.1–1.0)
Monocytes Relative: 4 %
Neutro Abs: 9 10*3/uL — ABNORMAL HIGH (ref 1.7–7.7)
Neutrophils Relative %: 87 %
Platelets: 261 10*3/uL (ref 150–400)
RBC: 3.37 MIL/uL — ABNORMAL LOW (ref 3.87–5.11)
RDW: 15.7 % — ABNORMAL HIGH (ref 11.5–15.5)
WBC: 10.3 10*3/uL (ref 4.0–10.5)
nRBC: 0 % (ref 0.0–0.2)

## 2019-02-19 LAB — LIPASE, BLOOD: Lipase: 43 U/L (ref 11–51)

## 2019-02-19 LAB — TYPE AND SCREEN
ABO/RH(D): O POS
Antibody Screen: NEGATIVE

## 2019-02-19 LAB — POC OCCULT BLOOD, ED: Fecal Occult Bld: POSITIVE — AB

## 2019-02-19 MED ORDER — ANASTROZOLE 1 MG PO TABS
1.0000 mg | ORAL_TABLET | Freq: Every day | ORAL | Status: DC
  Administered 2019-02-20: 1 mg via ORAL
  Filled 2019-02-19 (×2): qty 1

## 2019-02-19 MED ORDER — FLUTICASONE PROPIONATE 50 MCG/ACT NA SUSP: 2.0000 | Freq: Every day | NASAL | Status: DC | PRN

## 2019-02-19 MED ORDER — SODIUM CHLORIDE 0.9 % IV SOLN
INTRAVENOUS | Status: AC
  Administered 2019-02-19 – 2019-02-20 (×2): via INTRAVENOUS

## 2019-02-19 MED ORDER — ALBUTEROL SULFATE (2.5 MG/3ML) 0.083% IN NEBU: 3.0000 mL | INHALATION_SOLUTION | Freq: Four times a day (QID) | RESPIRATORY_TRACT | Status: DC | PRN

## 2019-02-19 MED ORDER — ATORVASTATIN CALCIUM 10 MG PO TABS
20.0000 mg | ORAL_TABLET | Freq: Every day | ORAL | Status: DC
  Administered 2019-02-20: 20 mg via ORAL
  Filled 2019-02-19: qty 2
  Filled 2019-02-19 (×2): qty 1

## 2019-02-19 MED ORDER — SODIUM CHLORIDE (PF) 0.9 % IJ SOLN
INTRAMUSCULAR | Status: AC
  Filled 2019-02-19: qty 50

## 2019-02-19 MED ORDER — ACETAMINOPHEN 650 MG RE SUPP: 650.0000 mg | Freq: Four times a day (QID) | RECTAL | Status: DC | PRN

## 2019-02-19 MED ORDER — IOHEXOL 300 MG/ML  SOLN
100.0000 mL | Freq: Once | INTRAMUSCULAR | Status: AC | PRN
  Administered 2019-02-19: 100 mL via INTRAVENOUS

## 2019-02-19 MED ORDER — PANTOPRAZOLE SODIUM 40 MG IV SOLR
40.0000 mg | Freq: Two times a day (BID) | INTRAVENOUS | Status: DC
  Administered 2019-02-20 (×3): 40 mg via INTRAVENOUS
  Filled 2019-02-19 (×3): qty 40

## 2019-02-19 MED ORDER — SERTRALINE HCL 50 MG PO TABS
50.0000 mg | ORAL_TABLET | Freq: Every day | ORAL | Status: DC
  Administered 2019-02-20: 50 mg via ORAL
  Filled 2019-02-19: qty 1

## 2019-02-19 MED ORDER — ACETAMINOPHEN 325 MG PO TABS: 650.0000 mg | ORAL_TABLET | Freq: Four times a day (QID) | ORAL | Status: DC | PRN

## 2019-02-19 MED ORDER — HYDROXYCHLOROQUINE SULFATE 200 MG PO TABS
200.0000 mg | ORAL_TABLET | Freq: Two times a day (BID) | ORAL | Status: DC
  Administered 2019-02-20: 200 mg via ORAL
  Filled 2019-02-19 (×5): qty 1

## 2019-02-19 MED ORDER — METOPROLOL TARTRATE 25 MG PO TABS
25.0000 mg | ORAL_TABLET | Freq: Two times a day (BID) | ORAL | Status: DC
  Administered 2019-02-20: 25 mg via ORAL
  Filled 2019-02-19 (×2): qty 1

## 2019-02-19 MED ORDER — GABAPENTIN 300 MG PO CAPS
600.0000 mg | ORAL_CAPSULE | Freq: Every day | ORAL | Status: DC
  Administered 2019-02-20 (×2): 600 mg via ORAL
  Filled 2019-02-19 (×2): qty 2

## 2019-02-19 NOTE — ED Triage Notes (Signed)
Pt reports dark blood in stool starting this morning. She states that she has seen it 3 times today. Endorses some left flank pain. Denies vomiting. Pt states that she still has a dry cough from when she was admitted with pneumonia last month, but denies any new respiratory symptoms. She wears 2L at all times.

## 2019-02-19 NOTE — ED Notes (Signed)
ED TO INPATIENT HANDOFF REPORT  Name/Age/Gender [name] 58 y.o. female  Code Status    Code Status Orders  (From admission, onward)         Start     Ordered   02/19/19 2252  Full code  Continuous     02/19/19 2253        Code Status History    Date Active Date Inactive Code Status Order ID Comments User Context   12/17/2018 1806 12/22/2018 2111 Full Code [id number]  Terrilee Croak, MD Inpatient   09/08/2017 0012 09/09/2017 1821 Full Code [id number]  Marjie Skiff, MD Inpatient   10/06/2016 2241 10/10/2016 1928 Full Code [id number]  Lily Kocher, MD Inpatient   10/08/2015 1814 10/11/2015 1714 Full Code [id number]  Veatrice Bourbon, MD Inpatient      Home/SNF/Other Home  Chief Complaint Blood in Stool   Level of Care/Admitting Diagnosis ED Disposition    ED Disposition Condition [place]: [facility] [[id number]]  Level of Care: Telemetry [5]  Admit to tele based on following criteria: Monitor for Ischemic changes  Diagnosis: Acute GI bleeding [099833]  Admitting Physician: Rise Patience [phone]  Attending Physician: Rise Patience [phone]  PT Class (Do Not Modify): Observation [104]  PT Acc Code (Do Not Modify): Observation [10022]       Medical History Past Medical History:  Diagnosis Date  . Acute on chronic respiratory failure ([place]) 10/09/2016  . Allergic rhinitis   . Breast cancer ([place]) 2016   right breast  . COPD, mild ([place]) FOLLOWED BY DR Melvyn Novas  . GERD (gastroesophageal reflux disease)   . HTN (hypertension)   . Hypertension   . Iron deficiency anemia   . Long-term current use of steroids SYMBICORT INHALER  . 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 Overweight (BMI 25.0-29.9) 05/11/2016  . Personal history of radiation therapy 2016  . Sarcoidosis STABLE PER CXR JUNE 2013  . Shortness of breath   . Sickle cell trait ([facility])     Allergies Allergies  Allergen Reactions  . Codeine Other (See Comments)     Avoids-felt bad when took before    IV Location/Drains/Wounds Patient Lines/Drains/Airways Status   Active Line/Drains/Airways    Name:   Placement date:   Placement time:   Site:   Days:   Peripheral IV 02/19/19 Left Hand   02/19/19    2000    Hand   less than 1          Labs/Imaging Results for orders placed or performed during the hospital encounter of 02/19/19 (from the past 48 hour(s))  POC occult blood, ED Provider will collect     Status: Abnormal   Collection Time: 02/19/19  7:55 PM  Result Value Ref Range   Fecal Occult Bld POSITIVE (A) NEGATIVE  Comprehensive metabolic panel     Status: Abnormal   Collection Time: 02/19/19  8:05 PM  Result Value Ref Range   Sodium 143 135 - 145 mmol/L   Potassium 3.8 3.5 - 5.1 mmol/L   Chloride 106 98 - 111 mmol/L   CO2 30 22 - 32 mmol/L   Glucose, Bld 139 (H) 70 - 99 mg/dL   BUN 16 6 - 20 mg/dL   Creatinine, Ser 1.12 (H) 0.44 - 1.00 mg/dL   Calcium 9.1 8.9 - 10.3 mg/dL   Total Protein 7.3 6.5 - 8.1 g/dL   Albumin 3.8 3.5 - 5.0 g/dL   AST 17 15 -  41 U/L   ALT 15 0 - 44 U/L   Alkaline Phosphatase 73 38 - 126 U/L   Total Bilirubin 0.4 0.3 - 1.2 mg/dL   GFR calc non Af Amer 54 (L) >60 mL/min   GFR calc Af Amer >60 >60 mL/min   Anion gap 7 5 - 15    Comment: Performed at [facility], [place] [address] [place], [place] [address]  CBC with Differential     Status: Abnormal   Collection Time: 02/19/19  8:05 PM  Result Value Ref Range   WBC 10.3 4.0 - 10.5 K/uL   RBC 3.37 (L) 3.87 - 5.11 MIL/uL   Hemoglobin 9.1 (L) 12.0 - 15.0 g/dL   HCT 29.6 (L) 36.0 - 46.0 %   MCV 87.8 80.0 - 100.0 fL   MCH 27.0 26.0 - 34.0 pg   MCHC 30.7 30.0 - 36.0 g/dL   RDW 15.7 (H) 11.5 - 15.5 %   Platelets 261 150 - 400 K/uL   nRBC 0.0 0.0 - 0.2 %   Neutrophils Relative % 87 %   Neutro Abs 9.0 (H) 1.7 - 7.7 K/uL   Lymphocytes Relative 7 %   Lymphs Abs 0.7 0.7 - 4.0 K/uL   Monocytes Relative 4 %   Monocytes Absolute 0.5 0.1 - 1.0  K/uL   Eosinophils Relative 1 %   Eosinophils Absolute 0.1 0.0 - 0.5 K/uL   Basophils Relative 0 %   Basophils Absolute 0.0 0.0 - 0.1 K/uL   Immature Granulocytes 1 %   Abs Immature Granulocytes 0.07 0.00 - 0.07 K/uL    Comment: Performed at [facility], [place] [address], [place], [place] [address]  Lipase, blood     Status: None   Collection Time: 02/19/19  8:05 PM  Result Value Ref Range   Lipase 43 11 - 51 U/L    Comment: Performed at [facility] [place] [address], [place], [place] [address]  Type and screen [place]     Status: None   Collection Time: 02/19/19  8:05 PM  Result Value Ref Range   ABO/RH(D) O POS    Antibody Screen NEG    Sample Expiration      02/22/2019 Performed at [facility], [place] [address], [place],  [address]    Ct Abdomen Pelvis W Contrast  Result Date: 02/19/2019 CLINICAL DATA:  Abd pain, diverticulitis suspected. Patient reports dark blood in stool today. Left flank pain. EXAM: CT ABDOMEN AND PELVIS WITH CONTRAST TECHNIQUE: Multidetector CT imaging of the abdomen and pelvis was performed using the standard protocol following bolus administration of intravenous contrast. CONTRAST:  170mL OMNIPAQUE IOHEXOL 300 MG/ML  SOLN COMPARISON:  Abdominal CT 04/17/2015 FINDINGS: Lower chest: Irregular nodular opacities at the lung bases appear stable. To 2016 CT and are consistent with scarring. 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 No acute airspace disease or pleural effusion. Hepatobiliary: Slight nodular hepatic contours appear similar to prior exam. No focal hepatic abnormality. No calcified gallstone, biliary dilatation, or pericholecystic inflammation. Pancreas: No ductal dilatation or inflammation. Spleen: Normal in size without focal abnormality. Adrenals/Urinary Tract: Normal adrenal glands. No hydronephrosis or perinephric edema. Homogeneous renal enhancement with  symmetric excretion on delayed phase imaging. Right renal stone on prior CT is possibly identified but obscured by IV contrast small bilateral renal cysts. Urinary bladder is physiologically distended without wall thickening. Stomach/Bowel: Ingested material distends the stomach. No gastric wall thickening. No small  bowel obstruction, wall thickening, or inflammatory change. Slight fecalization of distal small bowel contents. Normal appendix. Moderate stool burden throughout the colon. Mild diverticulosis of the descending and sigmoid colon. No acute diverticulitis. No colonic wall thickening or inflammatory change. Vascular/Lymphatic: Aorta bi-iliac atherosclerosis. No aneurysm. Prominent lymph nodes in the upper abdomen are stable to diminished in the interim. Peripancreatic node measuring 10 mm, image 7 series 7 is previously 13 mm. Left periaortic node at the level of the mid kidney measures 11 mm image 24 series 2, previously 13 mm. Previous perigastric node has resolved and is no longer seen. Reproductive: Status post hysterectomy. No adnexal masses. Other: No free air, free fluid, or intra-abdominal fluid collection. Tiny fat containing umbilical hernia Musculoskeletal: There are no acute or suspicious osseous abnormalities. IMPRESSION: 1. Colonic diverticulosis without diverticulitis. No acute findings in the abdomen/pelvis. 2. Additional chronic findings are stable from 2016 exam as described. Aortic Atherosclerosis (ICD10-I70.0). Electronically Signed   By: Keith Rake M.D.   On: 02/19/2019 21:33    Pending Labs Unresulted Labs (From admission, onward)    Start     Ordered   02/20/19 0160  Basic metabolic panel  Tomorrow morning,   R     02/19/19 2253   02/19/19 2252  CBC  Now then every 4 hours,   R     02/19/19 2253          Vitals/Pain Today's Vitals   02/19/19 2030 02/19/19 2238 02/19/19 2239 02/19/19 2300  BP: 123/62 (!) 165/69  127/70  Pulse: 72  87 76  Resp: 17     Temp:       TempSrc:      SpO2: 100%  99% 100%  Weight:      Height:      PainSc:        Isolation Precautions No active isolations  Medications Medications  sodium chloride (PF) 0.9 % injection (has no administration in time range)  hydroxychloroquine (PLAQUENIL) tablet 200 mg (has no administration in time range)  anastrozole (ARIMIDEX) tablet 1 mg (has no administration in time range)  atorvastatin (LIPITOR) tablet 20 mg (has no administration in time range)  metoprolol tartrate (LOPRESSOR) tablet 25 mg (has no administration in time range)  sertraline (ZOLOFT) tablet 50 mg (has no administration in time range)  gabapentin (NEURONTIN) capsule 600 mg (has no administration in time range)  albuterol (PROVENTIL) (2.5 MG/3ML) 0.083% nebulizer solution 3 mL (has no administration in time range)  fluticasone (FLONASE) 50 MCG/ACT nasal spray 2 spray (has no administration in time range)  acetaminophen (TYLENOL) tablet 650 mg (has no administration in time range)    Or  acetaminophen (TYLENOL) suppository 650 mg (has no administration in time range)  0.9 %  sodium chloride infusion (has no administration in time range)  pantoprazole (PROTONIX) injection 40 mg (has no administration in time range)  iohexol (OMNIPAQUE) 300 MG/ML solution 100 mL (100 mLs Intravenous Contrast Given 02/19/19 2057)    Mobility walks

## 2019-02-19 NOTE — ED Notes (Signed)
Bed: PN30 Expected date:  Expected time:  Means of arrival:  Comments: RM 5

## 2019-02-19 NOTE — ED Provider Notes (Signed)
[facility] DEPT Provider Note   CSN: [id number] Arrival date & time: 02/19/19  1915    History   Chief Complaint Chief Complaint  Patient presents with  . Blood In Stools    HPI [name] is a 58 y.o. female past no history of GERD, hypertension, iron deficiency anemia, sarcoidosis, breast cancer who presents for evaluation of blood in stools that began today.  She states that today she has had 3 episodes where she has had dark bloody stools.  She states she has not noticed any bright red blood.  Patient states that last night, she started developing some left-sided abdominal pain.  She states since that is been constantly irritating.  She describes it as a "nagging" type pain.  She states she has not had any nausea or vomiting.  She does report she has had some decreased appetite secondary to her symptoms.  Patient states she is currently on aspirin but denies any other blood thinners.  Patient states she has never had dark stools before.  Patient states that she has had some residual dry cough from a recent admission for pneumonia in February 2020. She is on 2L of oxygen at baseline and has not had any increase in that. Patient denies any fevers, chest pain, difficulty breathing, dysuria, hematuria. She does not smoke. She denies any consistent NSAID use.  Patient reports she had a colonoscopy done at age 21.  She was scheduled to have a repeat colonoscopy this year but it got canceled due to COVID-19.     The history is provided by the patient.    Past Medical History:  Diagnosis Date  . Acute on chronic respiratory failure ([place]) 10/09/2016  . Allergic rhinitis   . Breast cancer ([place]) 2016   right breast  . COPD, mild ([place]) FOLLOWED BY DR Melvyn Novas  . GERD (gastroesophageal reflux disease)   . HTN (hypertension)   . Hypertension   . Iron deficiency anemia   . Long-term current use of steroids SYMBICORT INHALER  . 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 Overweight  (BMI 25.0-29.9) 05/11/2016  . Personal history of radiation therapy 2016  . Sarcoidosis STABLE PER CXR JUNE 2013  . Shortness of breath   . Sickle cell trait [facility])     Patient Active Problem List   Diagnosis Date Noted  . Cough 01/10/2019  . Anemia 01/10/2019  . Other abnormal glucose 01/10/2019  . Acute on chronic respiratory failure ([place]) 12/17/2018  . Multifocal pneumonia 12/17/2018  . Acute exacerbation of chronic obstructive pulmonary disease (COPD) ([place]) 12/17/2018  . Trichimoniasis 02/21/2018  . Chronic cough 11/26/2017  . Depression   . Obesity (BMI 30-39.9) 01/10/2017  . Dyspnea on exertion 11/09/2016  . Chronic respiratory failure with hypoxia and hypercapnia ([place]) 10/29/2016  . Depression with anxiety 08/07/2016  . Breast cancer of upper-outer quadrant of right female breast ([place]) 08/04/2015  . Vaginal discharge 05/08/2013  . GERD (gastroesophageal reflux disease) 12/21/2012  . Hyperlipidemia 12/21/2012  . SICKLE-CELL TRAIT 04/15/2008  . Sarcoidosis ([place]) 01/10/2007  . HYPERTENSION, BENIGN SYSTEMIC 01/10/2007  . 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 ADJUSTABLE SUTURE MANIPULATION  05/22/2012   Procedure: ADJUSTABLE SUTURE MANIPULATION;  Surgeon: Dara Hoyer, MD;  Location: [facility];  Service: Ophthalmology;  Laterality: Right;  . BREAST LUMPECTOMY Right 2016  . CESAREAN SECTION  1986   W/ BILATERAL  TUBAL LIGATION  . EYE SURGERY    . MEDIAN RECTUS REPAIR  05/22/2012   Procedure: MEDIAN RECTUS REPAIR;  Surgeon: Dara Hoyer, MD;  Location: [facility];  Service: Ophthalmology;  Laterality: Bilateral;  INFERIOR RECTUS RESECTION WITH ADJUSTIBLE SUTURES RIGHT EYE   . RADIOACTIVE SEED GUIDED PARTIAL MASTECTOMY WITH AXILLARY SENTINEL LYMPH NODE BIOPSY Right 08/18/2015   Procedure: RADIOACTIVE SEED GUIDED PARTIAL MASTECTOMY WITH AXILLARY  SENTINEL LYMPH NODE BIOPSY;  Surgeon: Stark Klein, MD;  Location: [facility];  Service: General;  Laterality: Right;  . TOTAL ROBOTIC ASSISTED LAPAROSCOPIC HYSTERECTOMY  12-30-2010   SYMPTOMATIC UTERINE FIBROIDS  . UPPER TEETH EXTRACTION'S  1992     OB History   No obstetric history on file.      Home Medications    Prior to Admission medications   Medication Sig Start Date End Date Taking? Authorizing Provider  albuterol (PROVENTIL) (2.5 MG/3ML) 0.083% nebulizer solution Take 3 mLs (2.5 mg total) by nebulization every 6 (six) hours as needed for wheezing or shortness of breath. 12/22/18  Yes Bonnielee Haff, MD  albuterol (VENTOLIN HFA) 108 (90 Base) MCG/ACT inhaler Inhale 2 puffs into the lungs every 6 (six) hours as needed for wheezing or shortness of breath. 05/21/18  Yes Tanda Rockers, MD  anastrozole (ARIMIDEX) 1 MG tablet Take 1 tablet (1 mg total) by mouth daily. 02/06/19  Yes Nicholas Lose, MD  aspirin EC 81 MG tablet Take 81 mg by mouth daily.   Yes [provider]  atorvastatin (LIPITOR) 20 MG tablet Take 1 tablet (20 mg total) by mouth daily. 02/12/19  Yes Benay Pike, MD  erythromycin ophthalmic ointment Place 1 application into both eyes at bedtime.   Yes [provider]  fluticasone (FLONASE) 50 MCG/ACT nasal spray Place 2 sprays into both nostrils daily as needed for allergies. 08/06/18  Yes Benay Pike, MD  gabapentin (NEURONTIN) 300 MG capsule Take 2 capsules (600 mg total) by mouth at bedtime. 2 at bedtime 02/06/18  Yes Nicholas Lose, MD  Guaifenesin (MUCINEX MAXIMUM STRENGTH) 1200 MG TB12 Take 1,200 mg by mouth 2 (two) times daily as needed (chest congestion).    Yes [provider]  hydroxychloroquine (PLAQUENIL) 200 MG tablet Take 1 tablet by mouth twice daily 02/18/19  Yes Tanda Rockers, MD  ibuprofen (ADVIL,MOTRIN) 200 MG tablet Take 400 mg by mouth daily as needed for moderate pain.   Yes [provider]   metoprolol tartrate (LOPRESSOR) 25 MG tablet Take 1 tablet (25 mg total) by mouth 2 (two) times daily. 06/07/18  Yes Benay Pike, MD  Multiple Vitamin (MULTIVITAMIN) capsule Take 1 capsule by mouth daily.   Yes [provider]  omeprazole (PRILOSEC) 20 MG capsule TAKE 1 CAPSULE BY MOUTH ONCE DAILY 10/12/18  Yes Donnamae Jude, MD  OXYGEN 2lpm with rest and 3-4 lpm with exertion   Yes [provider]  senna (SENOKOT) 8.6 MG TABS tablet Take 1 tablet (8.6 mg total) by mouth 2 (two) times daily. 12/22/18  Yes Bonnielee Haff, MD  sertraline (ZOLOFT) 50 MG tablet Take 1 tablet (50 mg total) by mouth daily. 05/22/18  Yes Benay Pike, MD  Tiotropium Bromide-Olodaterol (STIOLTO RESPIMAT) 2.5-2.5 MCG/ACT AERS Inhale 2 puffs into the lungs daily. 12/30/18  Yes Tanda Rockers, MD  triamterene-hydrochlorothiazide (TKWIOXB-35) 37.5-25 MG tablet TAKE 1 TABLET BY MOUTH ONCE DAILY Patient taking differently: Take 1 tablet by mouth daily.  01/18/18  Yes Christinia Gully  B, MD  doxycycline (VIBRA-TABS) 100 MG tablet Take 1 tablet (100 mg total) by mouth 2 (two) times daily. Patient not taking: Reported on 02/19/2019 01/27/19   Parrett, Fonnie Mu, NP  predniSONE (DELTASONE) 10 MG tablet 2 daily until better then 1 daily Patient not taking: Reported on 02/19/2019 12/30/18   Tanda Rockers, MD  predniSONE (DELTASONE) 20 MG tablet Take 3 tablets orally daily for 3 days, then 2 tablets daily for 3 days, then 1 tablet daily for 3 days, then the usual dose of prednisone. Patient not taking: Reported on 02/19/2019 12/22/18   Bonnielee Haff, MD  Tiotropium Bromide-Olodaterol (STIOLTO RESPIMAT) 2.5-2.5 MCG/ACT AERS Inhale 2 puffs into the lungs daily. Patient not taking: Reported on 02/19/2019 01/27/19   Tanda Rockers, MD    Family History Family History  Problem Relation Age of Onset  . Hyperlipidemia Mother   . Asthma Mother   . Lupus Mother   . Breast cancer Mother 18  . Hypertension Father   .  Hyperlipidemia Father   . Hypertension Sister   . Hypertension Brother   . Cancer Neg Hx   . Diabetes Neg Hx   . Coronary artery disease Neg Hx     Social History Social History   Tobacco Use  . Smoking status: Former Smoker    Packs/day: 1.00    Years: 20.00    Pack years: 20.00    Last attempt to quit: 11/14/2003    Years since quitting: 15.2  . Smokeless tobacco: Never Used  Substance Use Topics  . Alcohol use: No  . Drug use: No     Allergies   Codeine   Review of Systems Review of Systems  Constitutional: Negative for fever.  Respiratory: Positive for cough. Negative for shortness of breath.   Cardiovascular: Negative for chest pain.  Gastrointestinal: Positive for abdominal pain and blood in stool. Negative for nausea and vomiting.  Genitourinary: Negative for dysuria and hematuria.  Neurological: Negative for headaches.  All other systems reviewed and are negative.    Physical Exam Updated Vital Signs BP 123/62   Pulse 72   Temp 98.7 F (37.1 C) (Oral)   Resp 17   Ht 5\' 5"  (1.651 m)   Wt 90.4 kg   SpO2 100%   BMI 33.15 kg/m   Physical Exam Vitals signs and nursing note reviewed. Exam conducted with a chaperone present.  Constitutional:      Appearance: Normal appearance. She is well-developed.  HENT:     Head: Normocephalic and atraumatic.  Eyes:     General: Lids are normal.     Conjunctiva/sclera: Conjunctivae normal.     Pupils: Pupils are equal, round, and reactive to light.  Neck:     Musculoskeletal: Full passive range of motion without pain.  Cardiovascular:     Rate and Rhythm: Normal rate and regular rhythm.     Pulses: Normal pulses.     Heart sounds: Normal heart sounds. No murmur. No friction rub. No gallop.   Pulmonary:     Effort: Pulmonary effort is normal.     Breath sounds: Normal breath sounds.     Comments: Lungs clear to auscultation bilaterally.  Symmetric chest rise.  No wheezing, rales, rhonchi. Abdominal:      Palpations: Abdomen is soft. Abdomen is not rigid.     Tenderness: There is abdominal tenderness in the left lower quadrant. There is no guarding.     Comments: Abdomen is soft, nondistended.  Tenderness noted  to the left lower quadrant.  No rigidity, guarding.  Genitourinary:    Rectum: Guaiac result positive. External hemorrhoid present.     Comments: The exam was performed with a chaperone present.  Small nonthrombosed external hemorrhoid noted at 6:00 region.  DRE revealed no evidence of gross melena. Musculoskeletal: Normal range of motion.  Skin:    General: Skin is warm and dry.     Capillary Refill: Capillary refill takes less than 2 seconds.  Neurological:     Mental Status: She is alert and oriented to person, place, and time.  Psychiatric:        Speech: Speech normal.      ED Treatments / Results  Labs (all labs ordered are listed, but only abnormal results are displayed) Labs Reviewed  COMPREHENSIVE METABOLIC PANEL - Abnormal; Notable for the following components:      Result Value   Glucose, Bld 139 (*)    Creatinine, Ser 1.12 (*)    GFR calc non Af Amer 54 (*)    All other components within normal limits  CBC WITH DIFFERENTIAL/PLATELET - Abnormal; Notable for the following components:   RBC 3.37 (*)    Hemoglobin 9.1 (*)    HCT 29.6 (*)    RDW 15.7 (*)    Neutro Abs 9.0 (*)    All other components within normal limits  POC OCCULT BLOOD, ED - Abnormal; Notable for the following components:   Fecal Occult Bld POSITIVE (*)    All other components within normal limits  LIPASE, BLOOD  TYPE AND SCREEN    EKG None  Radiology Ct Abdomen Pelvis W Contrast  Result Date: 02/19/2019 CLINICAL DATA:  Abd pain, diverticulitis suspected. Patient reports dark blood in stool today. Left flank pain. EXAM: CT ABDOMEN AND PELVIS WITH CONTRAST TECHNIQUE: Multidetector CT imaging of the abdomen and pelvis was performed using the standard protocol following bolus administration  of intravenous contrast. CONTRAST:  150mL OMNIPAQUE IOHEXOL 300 MG/ML  SOLN COMPARISON:  Abdominal CT 04/17/2015 FINDINGS: Lower chest: Irregular nodular opacities at the lung bases appear stable. To 2016 CT and are consistent with scarring. 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 No acute airspace disease or pleural effusion. Hepatobiliary: Slight nodular hepatic contours appear similar to prior exam. No focal hepatic abnormality. No calcified gallstone, biliary dilatation, or pericholecystic inflammation. Pancreas: No ductal dilatation or inflammation. Spleen: Normal in size without focal abnormality. Adrenals/Urinary Tract: Normal adrenal glands. No hydronephrosis or perinephric edema. Homogeneous renal enhancement with symmetric excretion on delayed phase imaging. Right renal stone on prior CT is possibly identified but obscured by IV contrast small bilateral renal cysts. Urinary bladder is physiologically distended without wall thickening. Stomach/Bowel: Ingested material distends the stomach. No gastric wall thickening. No small bowel obstruction, wall thickening, or inflammatory change. Slight fecalization of distal small bowel contents. Normal appendix. Moderate stool burden throughout the colon. Mild diverticulosis of the descending and sigmoid colon. No acute diverticulitis. No colonic wall thickening or inflammatory change. Vascular/Lymphatic: Aorta bi-iliac atherosclerosis. No aneurysm. Prominent lymph nodes in the upper abdomen are stable to diminished in the interim. Peripancreatic node measuring 10 mm, image 7 series 7 is previously 13 mm. Left periaortic node at the level of the mid kidney measures 11 mm image 24 series 2, previously 13 mm. Previous perigastric node has resolved and is no longer seen. Reproductive: Status post hysterectomy. No adnexal masses. Other: No free air, free fluid, or intra-abdominal fluid collection. Tiny fat containing umbilical hernia Musculoskeletal: There  are no acute or suspicious osseous abnormalities. IMPRESSION: 1. Colonic diverticulosis without diverticulitis. No acute findings in the abdomen/pelvis. 2. Additional chronic findings are stable from 2016 exam as described. Aortic Atherosclerosis (ICD10-I70.0). Electronically Signed   By: Keith Rake M.D.   On: 02/19/2019 21:33    Procedures Procedures (including critical care time)  Medications Ordered in ED Medications  sodium chloride (PF) 0.9 % injection (has no administration in time range)  iohexol (OMNIPAQUE) 300 MG/ML solution 100 mL (100 mLs Intravenous Contrast Given 02/19/19 2057)     Initial Impression / Assessment and Plan / ED Course  I have reviewed the triage vital signs and the nursing notes.  Pertinent labs & imaging results that were available during my care of the patient were reviewed by me and considered in my medical decision making (see chart for details).        58 year old female who presents for evaluation of dark stools that began today as well as some left lower quadrant abdominal pain that began last night.  No fevers.  She is on aspirin but no other blood thinners.  She does not smoke.  No history of NSAID use. Patient is afebrile, non-toxic appearing, sitting comfortably on examination table. Vital signs reviewed and stable.  Concern for GI bleed versus diverticulitis versus hemorrhoids.  We will plan to check labs.  CMP shows a creatinine of 1.12.  Normal BUN.  CBC shows hemoglobin of 9.1.  Review of records show that she is consistently anemic and her hemoglobin ranges from 8-9.  Fecal occult was positive.  Lipase is normal.  CT abd/pelvis shows colonic diverticulosis with no evidence of diverticulitis. Otherwise no acute findings.   Discussed patient with Dr. Tyrone Nine. Given patient's age and risk factors, reasonable for admission for trending hemoglobins. Additionally might benefit from GI consult. She has previously seen [facility] and that's who she  was planning to get her colonoscopy with.    Discussed patient with Dr. Hal Hope (hospitalist). Will accept for admission.   Portions of this note were generated with [employer]. Dictation errors may occur despite best attempts at proofreading.   Final Clinical Impressions(s) / ED Diagnoses   Final diagnoses:  Lower GI bleed    ED Discharge Orders    None       Volanda Napoleon, PA-C 02/19/19 [place], [place], DO 02/19/19 2301

## 2019-02-19 NOTE — H&P (Addendum)
History and Physical    [name] KWI:[id number] DOB: 14-Jun-1961 DOA: 02/19/2019  PCP: Benay Pike, MD  Patient coming from: Home.  Chief Complaint: Rectal bleeding.  HPI: [name] is a 58 y.o. female with history of COPD, sarcoidosis, breast cancer, chronic anemia admitted in the month of February 2 months ago for multifocal pneumonia presents to the ER after patient had 3 episodes of rectal bleeding.  Patient states on the night of February 18, 2019 patient start developing some left quadrant pain.  This morning patient had progressively worsening rectal bleeding which was frank blood.  Had 3 episodes.  Denies any nausea vomiting or dizziness or loss of consciousness or chest pain or shortness of breath denies any fever chills or recent contact with COVID-19.  Had taken 2 tablets of Motrin for the left lower quadrant pain but states that she usually does not take NSAIDs.  Patient is on aspirin.  Patient states she is already scheduled to have a colonoscopy last month but was postponed because of recent pandemic.  ED Course: In the ER patient's lab work show hemoglobin of around 9.1 platelets 261 WBC 10.3.  Creatinine is 1.1 LFTs were normal.  Patient did not have any further episodes in the ER.  Mid for further observation.  Since patient had a left lower quadrant pain CAT scan was done which did not show anything acute except for diverticulosis.  Review of Systems: As per HPI, rest all negative.   Past Medical History:  Diagnosis Date  . Acute on chronic respiratory failure ([place]) 10/09/2016  . Allergic rhinitis   . Breast cancer ([place]) 2016   right breast  . COPD, mild ([place]) FOLLOWED BY DR Melvyn Novas  . GERD (gastroesophageal reflux disease)   . HTN (hypertension)   . Hypertension   . Iron deficiency anemia   . Long-term current use of steroids SYMBICORT INHALER  . 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 Overweight (BMI 25.0-29.9) 05/11/2016  . Personal history of radiation therapy 2016  .  Sarcoidosis STABLE PER CXR JUNE 2013  . Shortness of breath   . Sickle cell trait [facility])     Past Surgical History:  Procedure Laterality Date  . ADJUSTABLE SUTURE MANIPULATION  05/22/2012   Procedure: ADJUSTABLE SUTURE MANIPULATION;  Surgeon: Dara Hoyer, MD;  Location: [facility];  Service: Ophthalmology;  Laterality: Right;  . BREAST LUMPECTOMY Right 2016  . CESAREAN SECTION  1986   W/ BILATERAL TUBAL LIGATION  . EYE SURGERY    . MEDIAN RECTUS REPAIR  05/22/2012   Procedure: MEDIAN RECTUS REPAIR;  Surgeon: Dara Hoyer, MD;  Location: [facility];  Service: Ophthalmology;  Laterality: Bilateral;  INFERIOR RECTUS RESECTION WITH ADJUSTIBLE SUTURES RIGHT EYE   . RADIOACTIVE SEED GUIDED PARTIAL MASTECTOMY WITH AXILLARY SENTINEL LYMPH NODE BIOPSY Right 08/18/2015   Procedure: RADIOACTIVE SEED GUIDED PARTIAL MASTECTOMY WITH AXILLARY SENTINEL LYMPH NODE BIOPSY;  Surgeon: Stark Klein, MD;  Location: [facility];  Service: General;  Laterality: Right;  . TOTAL ROBOTIC ASSISTED LAPAROSCOPIC HYSTERECTOMY  12-30-2010   SYMPTOMATIC UTERINE FIBROIDS  . UPPER TEETH EXTRACTION'S  1992     reports that she quit smoking about 15 years ago. She has a 20.00 pack-year smoking history. She has never used smokeless tobacco. She reports that she does not drink alcohol or use drugs.  Allergies  Allergen Reactions  . Codeine Other (See Comments)    Avoids-felt bad when took before  Family History  Problem Relation Age of Onset  . Hyperlipidemia Mother   . Asthma Mother   . Lupus Mother   . Breast cancer Mother 60  . Hypertension Father   . Hyperlipidemia Father   . Hypertension Sister   . Hypertension Brother   . Cancer Neg Hx   . Diabetes Neg Hx   . Coronary artery disease Neg Hx     Prior to Admission medications   Medication Sig Start Date End Date Taking? Authorizing Provider  albuterol (PROVENTIL) (2.5 MG/3ML) 0.083% nebulizer  solution Take 3 mLs (2.5 mg total) by nebulization every 6 (six) hours as needed for wheezing or shortness of breath. 12/22/18  Yes Bonnielee Haff, MD  albuterol (VENTOLIN HFA) 108 (90 Base) MCG/ACT inhaler Inhale 2 puffs into the lungs every 6 (six) hours as needed for wheezing or shortness of breath. 05/21/18  Yes Tanda Rockers, MD  anastrozole (ARIMIDEX) 1 MG tablet Take 1 tablet (1 mg total) by mouth daily. 02/06/19  Yes Nicholas Lose, MD  aspirin EC 81 MG tablet Take 81 mg by mouth daily.   Yes [provider]  atorvastatin (LIPITOR) 20 MG tablet Take 1 tablet (20 mg total) by mouth daily. 02/12/19  Yes Benay Pike, MD  erythromycin ophthalmic ointment Place 1 application into both eyes at bedtime.   Yes [provider]  fluticasone (FLONASE) 50 MCG/ACT nasal spray Place 2 sprays into both nostrils daily as needed for allergies. 08/06/18  Yes Benay Pike, MD  gabapentin (NEURONTIN) 300 MG capsule Take 2 capsules (600 mg total) by mouth at bedtime. 2 at bedtime 02/06/18  Yes Nicholas Lose, MD  Guaifenesin (MUCINEX MAXIMUM STRENGTH) 1200 MG TB12 Take 1,200 mg by mouth 2 (two) times daily as needed (chest congestion).    Yes [provider]  hydroxychloroquine (PLAQUENIL) 200 MG tablet Take 1 tablet by mouth twice daily 02/18/19  Yes Tanda Rockers, MD  ibuprofen (ADVIL,MOTRIN) 200 MG tablet Take 400 mg by mouth daily as needed for moderate pain.   Yes [provider]  metoprolol tartrate (LOPRESSOR) 25 MG tablet Take 1 tablet (25 mg total) by mouth 2 (two) times daily. 06/07/18  Yes Benay Pike, MD  Multiple Vitamin (MULTIVITAMIN) capsule Take 1 capsule by mouth daily.   Yes [provider]  omeprazole (PRILOSEC) 20 MG capsule TAKE 1 CAPSULE BY MOUTH ONCE DAILY 10/12/18  Yes Donnamae Jude, MD  OXYGEN 2lpm with rest and 3-4 lpm with exertion   Yes [provider]  senna (SENOKOT) 8.6 MG TABS tablet Take 1 tablet (8.6 mg total) by mouth 2  (two) times daily. 12/22/18  Yes Bonnielee Haff, MD  sertraline (ZOLOFT) 50 MG tablet Take 1 tablet (50 mg total) by mouth daily. 05/22/18  Yes Benay Pike, MD  Tiotropium Bromide-Olodaterol (STIOLTO RESPIMAT) 2.5-2.5 MCG/ACT AERS Inhale 2 puffs into the lungs daily. 12/30/18  Yes Tanda Rockers, MD  triamterene-hydrochlorothiazide (IDPOEUM-35) 37.5-25 MG tablet TAKE 1 TABLET BY MOUTH ONCE DAILY Patient taking differently: Take 1 tablet by mouth daily.  01/18/18  Yes Tanda Rockers, MD  doxycycline (VIBRA-TABS) 100 MG tablet Take 1 tablet (100 mg total) by mouth 2 (two) times daily. Patient not taking: Reported on 02/19/2019 01/27/19   Parrett, Fonnie Mu, NP  predniSONE (DELTASONE) 10 MG tablet 2 daily until better then 1 daily Patient not taking: Reported on 02/19/2019 12/30/18   Tanda Rockers, MD  predniSONE (DELTASONE) 20 MG tablet Take 3  tablets orally daily for 3 days, then 2 tablets daily for 3 days, then 1 tablet daily for 3 days, then the usual dose of prednisone. Patient not taking: Reported on 02/19/2019 12/22/18   Bonnielee Haff, MD  Tiotropium Bromide-Olodaterol (STIOLTO RESPIMAT) 2.5-2.5 MCG/ACT AERS Inhale 2 puffs into the lungs daily. Patient not taking: Reported on 02/19/2019 01/27/19   Tanda Rockers, MD    Physical Exam: Vitals:   02/19/19 1947 02/19/19 2030 02/19/19 2238 02/19/19 2239  BP: (!) 156/83 123/62 (!) 165/69   Pulse:  72  87  Resp:  17    Temp:      TempSrc:      SpO2:  100%  99%  Weight:      Height:          Constitutional: Moderately built and nourished. Vitals:   02/19/19 1947 02/19/19 2030 02/19/19 2238 02/19/19 2239  BP: (!) 156/83 123/62 (!) 165/69   Pulse:  72  87  Resp:  17    Temp:      TempSrc:      SpO2:  100%  99%  Weight:      Height:       Eyes: Anicteric no pallor. ENMT: No discharge from the ears eyes nose and mouth. Neck: No mass or.  No neck rigidity. Respiratory: No rhonchi or crepitations. Cardiovascular: S1-S2 heard. Abdomen:  Soft mild left lower quadrant tenderness.  No guarding or rigidity. Musculoskeletal: No edema. Skin: No edema. Neurologic: No rash. Psychiatric: Alert awake oriented to time place and person.  Moves all extremities.   Labs on Admission: I have personally reviewed following labs and imaging studies  CBC: Recent Labs  Lab 02/19/19 2005  WBC 10.3  NEUTROABS 9.0*  HGB 9.1*  HCT 29.6*  MCV 87.8  PLT 665   Basic Metabolic Panel: Recent Labs  Lab 02/19/19 2005  NA 143  K 3.8  CL 106  CO2 30  GLUCOSE 139*  BUN 16  CREATININE 1.12*  CALCIUM 9.1   GFR: Estimated Creatinine Clearance: 60.1 mL/min (A) (by C-G formula based on SCr of 1.12 mg/dL (H)). Liver Function Tests: Recent Labs  Lab 02/19/19 2005  AST 17  ALT 15  ALKPHOS 73  BILITOT 0.4  PROT 7.3  ALBUMIN 3.8   Recent Labs  Lab 02/19/19 2005  LIPASE 43   No results for input(s): AMMONIA in the last 168 hours. Coagulation Profile: No results for input(s): INR, PROTIME in the last 168 hours. Cardiac Enzymes: No results for input(s): CKTOTAL, CKMB, CKMBINDEX, TROPONINI in the last 168 hours. BNP (last 3 results) No results for input(s): PROBNP in the last 8760 hours. HbA1C: No results for input(s): HGBA1C in the last 72 hours. CBG: No results for input(s): GLUCAP in the last 168 hours. Lipid Profile: No results for input(s): CHOL, HDL, LDLCALC, TRIG, CHOLHDL, LDLDIRECT in the last 72 hours. Thyroid Function Tests: No results for input(s): TSH, T4TOTAL, FREET4, T3FREE, THYROIDAB in the last 72 hours. Anemia Panel: No results for input(s): VITAMINB12, FOLATE, FERRITIN, TIBC, IRON, RETICCTPCT in the last 72 hours. Urine analysis:    Component Value Date/Time   COLORURINE YELLOW 10/06/2016 1423   APPEARANCEUR CLEAR 10/06/2016 1423   LABSPEC 1.011 10/06/2016 1423   PHURINE 6.0 10/06/2016 1423   GLUCOSEU NEGATIVE 10/06/2016 1423   HGBUR TRACE (A) 10/06/2016 1423   HGBUR small 05/13/2010 1505   BILIRUBINUR  NEGATIVE 10/06/2016 1423   BILIRUBINUR NEG 12/29/2015 1444   KETONESUR NEGATIVE 10/06/2016 1423  PROTEINUR NEGATIVE 10/06/2016 1423   UROBILINOGEN 0.2 12/29/2015 1444   UROBILINOGEN 0.2 04/17/2015 1618   NITRITE NEGATIVE 10/06/2016 1423   LEUKOCYTESUR TRACE (A) 10/06/2016 1423   Sepsis Labs: @LABRCNTIP (procalcitonin:4,lacticidven:4) )No results found for this or any previous visit (from the past 240 hour(s)).   Radiological Exams on Admission: Ct Abdomen Pelvis W Contrast  Result Date: 02/19/2019 CLINICAL DATA:  Abd pain, diverticulitis suspected. Patient reports dark blood in stool today. Left flank pain. EXAM: CT ABDOMEN AND PELVIS WITH CONTRAST TECHNIQUE: Multidetector CT imaging of the abdomen and pelvis was performed using the standard protocol following bolus administration of intravenous contrast. CONTRAST:  159mL OMNIPAQUE IOHEXOL 300 MG/ML  SOLN COMPARISON:  Abdominal CT 04/17/2015 FINDINGS: Lower chest: Irregular nodular opacities at the lung bases appear stable. To 2016 CT and are consistent with scarring. 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 No acute airspace disease or pleural effusion. Hepatobiliary: Slight nodular hepatic contours appear similar to prior exam. No focal hepatic abnormality. No calcified gallstone, biliary dilatation, or pericholecystic inflammation. Pancreas: No ductal dilatation or inflammation. Spleen: Normal in size without focal abnormality. Adrenals/Urinary Tract: Normal adrenal glands. No hydronephrosis or perinephric edema. Homogeneous renal enhancement with symmetric excretion on delayed phase imaging. Right renal stone on prior CT is possibly identified but obscured by IV contrast small bilateral renal cysts. Urinary bladder is physiologically distended without wall thickening. Stomach/Bowel: Ingested material distends the stomach. No gastric wall thickening. No small bowel obstruction, wall thickening, or inflammatory change. Slight fecalization  of distal small bowel contents. Normal appendix. Moderate stool burden throughout the colon. Mild diverticulosis of the descending and sigmoid colon. No acute diverticulitis. No colonic wall thickening or inflammatory change. Vascular/Lymphatic: Aorta bi-iliac atherosclerosis. No aneurysm. Prominent lymph nodes in the upper abdomen are stable to diminished in the interim. Peripancreatic node measuring 10 mm, image 7 series 7 is previously 13 mm. Left periaortic node at the level of the mid kidney measures 11 mm image 24 series 2, previously 13 mm. Previous perigastric node has resolved and is no longer seen. Reproductive: Status post hysterectomy. No adnexal masses. Other: No free air, free fluid, or intra-abdominal fluid collection. Tiny fat containing umbilical hernia Musculoskeletal: There are no acute or suspicious osseous abnormalities. IMPRESSION: 1. Colonic diverticulosis without diverticulitis. No acute findings in the abdomen/pelvis. 2. Additional chronic findings are stable from 2016 exam as described. Aortic Atherosclerosis (ICD10-I70.0). Electronically Signed   By: Keith Rake M.D.   On: 02/19/2019 21:33      Assessment/Plan Principal Problem:   Acute GI bleeding Active Problems:   Sarcoidosis ([place])   HYPERTENSION, BENIGN SYSTEMIC   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    GERD (gastroesophageal reflux disease)   Breast cancer of upper-outer quadrant of right female breast ([facility])   Anemia    1. Acute GI bleeding likely from lower GI given that patient states she had frank rectal bleeding.  CAT scan shows diverticulosis.  Will hold aspirin patient states she took NSAIDs this morning which she rarely takes.  Patient is on long-term prednisone.  I have placed patient on IV Protonix every 12 will check serial CBC may discuss with [facility] in the morning.  N.p.o. for now except medications. 2. COPD not actively wheezing. 3. Sarcoidosis on hydroxychloroquine and  prednisone.  For now I have dosed prednisone IV Solu-Medrol. 4. History of breast cancer on Arimidex. 5. Acute blood loss anemia has chronic complement.  Follow CBC.   DVT prophylaxis: SCDs.  Code Status: Full code. Family Communication: Discussed with patient. Disposition Plan: Home. Consults called: None. Admission status: Observation.   Rise Patience MD [facility] Pager [phone].  If 7PM-7AM, please contact night-coverage [email or web address] Password [facility]  02/19/2019, 10:53 PM

## 2019-02-20 DIAGNOSIS — J449 Chronic obstructive pulmonary disease, unspecified: Secondary | ICD-10-CM | POA: Diagnosis not present

## 2019-02-20 DIAGNOSIS — K922 Gastrointestinal hemorrhage, unspecified: Secondary | ICD-10-CM | POA: Diagnosis not present

## 2019-02-20 DIAGNOSIS — C50411 Malignant neoplasm of upper-outer quadrant of right female breast: Secondary | ICD-10-CM

## 2019-02-20 DIAGNOSIS — D869 Sarcoidosis, unspecified: Secondary | ICD-10-CM | POA: Diagnosis not present

## 2019-02-20 DIAGNOSIS — K921 Melena: Secondary | ICD-10-CM

## 2019-02-20 DIAGNOSIS — D649 Anemia, unspecified: Secondary | ICD-10-CM | POA: Diagnosis not present

## 2019-02-20 DIAGNOSIS — K219 Gastro-esophageal reflux disease without esophagitis: Secondary | ICD-10-CM

## 2019-02-20 DIAGNOSIS — Z170 Estrogen receptor positive status [ER+]: Secondary | ICD-10-CM

## 2019-02-20 LAB — IRON AND TIBC
Iron: 53 ug/dL (ref 28–170)
Saturation Ratios: 21 % (ref 10.4–31.8)
TIBC: 253 ug/dL (ref 250–450)
UIBC: 200 ug/dL

## 2019-02-20 LAB — CBC
HCT: 27.4 % — ABNORMAL LOW (ref 36.0–46.0)
HCT: 27.5 % — ABNORMAL LOW (ref 36.0–46.0)
HCT: 28.2 % — ABNORMAL LOW (ref 36.0–46.0)
Hemoglobin: 8 g/dL — ABNORMAL LOW (ref 12.0–15.0)
Hemoglobin: 8.2 g/dL — ABNORMAL LOW (ref 12.0–15.0)
Hemoglobin: 8.4 g/dL — ABNORMAL LOW (ref 12.0–15.0)
MCH: 26.2 pg (ref 26.0–34.0)
MCH: 26.5 pg (ref 26.0–34.0)
MCH: 26.6 pg (ref 26.0–34.0)
MCHC: 29.1 g/dL — ABNORMAL LOW (ref 30.0–36.0)
MCHC: 29.8 g/dL — ABNORMAL LOW (ref 30.0–36.0)
MCHC: 29.9 g/dL — ABNORMAL LOW (ref 30.0–36.0)
MCV: 88.7 fL (ref 80.0–100.0)
MCV: 89.2 fL (ref 80.0–100.0)
MCV: 90.2 fL (ref 80.0–100.0)
Platelets: 221 10*3/uL (ref 150–400)
Platelets: 234 10*3/uL (ref 150–400)
Platelets: 245 10*3/uL (ref 150–400)
RBC: 3.05 MIL/uL — ABNORMAL LOW (ref 3.87–5.11)
RBC: 3.09 MIL/uL — ABNORMAL LOW (ref 3.87–5.11)
RBC: 3.16 MIL/uL — ABNORMAL LOW (ref 3.87–5.11)
RDW: 15.6 % — ABNORMAL HIGH (ref 11.5–15.5)
RDW: 15.7 % — ABNORMAL HIGH (ref 11.5–15.5)
RDW: 15.7 % — ABNORMAL HIGH (ref 11.5–15.5)
WBC: 8.6 10*3/uL (ref 4.0–10.5)
WBC: 9.1 10*3/uL (ref 4.0–10.5)
WBC: 9.1 10*3/uL (ref 4.0–10.5)
nRBC: 0 % (ref 0.0–0.2)
nRBC: 0 % (ref 0.0–0.2)
nRBC: 0 % (ref 0.0–0.2)

## 2019-02-20 LAB — BASIC METABOLIC PANEL
Anion gap: 7 (ref 5–15)
BUN: 18 mg/dL (ref 6–20)
CO2: 30 mmol/L (ref 22–32)
Calcium: 8.7 mg/dL — ABNORMAL LOW (ref 8.9–10.3)
Chloride: 106 mmol/L (ref 98–111)
Creatinine, Ser: 1.05 mg/dL — ABNORMAL HIGH (ref 0.44–1.00)
GFR calc Af Amer: >60 mL/min (ref >60)
GFR calc non Af Amer: 58 mL/min — ABNORMAL LOW (ref >60)
Glucose, Bld: 101 mg/dL — ABNORMAL HIGH (ref 70–99)
Potassium: 3.6 mmol/L (ref 3.5–5.1)
Sodium: 143 mmol/L (ref 135–145)

## 2019-02-20 LAB — FERRITIN: Ferritin: 59 ng/mL (ref 11–307)

## 2019-02-20 LAB — VITAMIN B12: Vitamin B-12: 382 pg/mL (ref 180–914)

## 2019-02-20 LAB — RETICULOCYTES
Immature Retic Fract: 21.7 % — ABNORMAL HIGH (ref 2.3–15.9)
RBC.: 3.14 MIL/uL — ABNORMAL LOW (ref 3.87–5.11)
Retic Count, Absolute: 56.2 10*3/uL (ref 19.0–186.0)
Retic Ct Pct: 1.8 % (ref 0.4–3.1)

## 2019-02-20 LAB — FOLATE: Folate: 15.2 ng/mL (ref >5.9)

## 2019-02-20 MED ORDER — METHYLPREDNISOLONE SODIUM SUCC 40 MG IJ SOLR
20.0000 mg | Freq: Every day | INTRAMUSCULAR | Status: DC
  Administered 2019-02-20: 20 mg via INTRAVENOUS
  Filled 2019-02-20: qty 1

## 2019-02-20 MED ORDER — PEG-KCL-NACL-NASULF-NA ASC-C 100 G PO SOLR
0.5000 | ORAL | Status: AC
  Administered 2019-02-20 – 2019-02-21 (×2): 100 g via ORAL
  Filled 2019-02-20: qty 1

## 2019-02-20 NOTE — Consult Note (Addendum)
Referring Provider: [facility]   Primary Care Physician:  Benay Pike, MD Primary Gastroenterologist:  Previously Verl Blalock, MD    Reason for Consultation: GI bleed    ASSESSMENT / PLAN:    5.  58 year old female here with gastrointestinal bleeding, presumably lower and acute on chronic [place] anemia.  Hemoglobin 8.4 , down approximately 1 g from baseline.  She developed nagging left flank pain 1 day prior to the onset of bleeding which may or may not be related.  No evidence for ischemic colitis or other acute findings on CT scan with contrast.  This could be a diverticular hemorrhage .  Seems unlikely to be upper GI bleed with normal BUN.  Additionally she describes brown stools with dark red blood.  -Last colonoscopy was in 2010.  She is due for repeat colonoscopy which would need to be done in the hospital anyway as patient is on oxygen.  Will probably proceed with inpatient colonoscopy.  I will consent her for EGD as well in case colonoscopy negative.  -clear liquids -check iron studies  2. Sarcoidosis / COPD home O2.  She is on prednisone.  3.  AKI, resolving  4.  GERD, asymptomatic on daily PPI   HPI:      HPI: [name] is a 58 y.o. female with history of breast cancer, status post resection.  She is on Arimadex.  Sarcoidosis.  She is on home O2 and currently taking prednisone.  Colonoscopy by [place] 10 years ago for evaluation of anemia.  No findings to explain anemia.  Patient is due for another colonoscopy, our office contact her for previsit but had to postpone it secondary to COVID-19 pandemic.   Couple days ago patient developed left flank pain.  She describes the pain as a nagging discomfort not better or worse with eating.  Not positional.  Yesterday she had 3 brown stools with dark red blood.  She describes the blood is more of a burgundy color.  No upper abdominal pain, nausea or vomiting.  She took ibuprofen for the left flank pain but other than that  has not had any insight use.  No urinary symptoms.  She has a lingering cough since being patient admission for pneumonia.  [name] has a history of GERD.  She takes Prilosec on a daily basis with good control of symptoms.    Past Medical History:  Diagnosis Date  . Acute on chronic respiratory failure ([place]) 10/09/2016  . Allergic rhinitis   . Breast cancer ([place]) 2016   right breast  . COPD, mild ([place]) FOLLOWED BY DR Melvyn Novas  . GERD (gastroesophageal reflux disease)   . HTN (hypertension)   . Hypertension   . Iron deficiency anemia   . Long-term current use of steroids SYMBICORT INHALER  . 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 Overweight (BMI 25.0-29.9) 05/11/2016  . Personal history of radiation therapy 2016  . Sarcoidosis STABLE PER CXR JUNE 2013  . Shortness of breath   . Sickle cell trait [facility])     Past Surgical History:  Procedure Laterality Date  . ADJUSTABLE SUTURE MANIPULATION  05/22/2012   Procedure: ADJUSTABLE SUTURE MANIPULATION;  Surgeon: Dara Hoyer, MD;  Location: [facility]  Service: Ophthalmology;  Laterality: Right;  . BREAST LUMPECTOMY Right 2016  . CESAREAN SECTION  1986   W/ BILATERAL TUBAL LIGATION  . EYE SURGERY    . MEDIAN RECTUS REPAIR  05/22/2012   Procedure: MEDIAN RECTUS REPAIR;  Surgeon:  Dara Hoyer, MD;  Location: [facility];  Service: Ophthalmology;  Laterality: Bilateral;  INFERIOR RECTUS RESECTION WITH ADJUSTIBLE SUTURES RIGHT EYE   . RADIOACTIVE SEED GUIDED PARTIAL MASTECTOMY WITH AXILLARY SENTINEL LYMPH NODE BIOPSY Right 08/18/2015   Procedure: RADIOACTIVE SEED GUIDED PARTIAL MASTECTOMY WITH AXILLARY SENTINEL LYMPH NODE BIOPSY;  Surgeon: Stark Klein, MD;  Location: [place];  Service: General;  Laterality: Right;  . TOTAL ROBOTIC ASSISTED LAPAROSCOPIC HYSTERECTOMY  12-30-2010   SYMPTOMATIC UTERINE FIBROIDS  . UPPER TEETH EXTRACTION'S  1992    Prior to Admission medications   Medication Sig Start  Date End Date Taking? Authorizing Provider  albuterol (PROVENTIL) (2.5 MG/3ML) 0.083% nebulizer solution Take 3 mLs (2.5 mg total) by nebulization every 6 (six) hours as needed for wheezing or shortness of breath. 12/22/18  Yes Bonnielee Haff, MD  albuterol (VENTOLIN HFA) 108 (90 Base) MCG/ACT inhaler Inhale 2 puffs into the lungs every 6 (six) hours as needed for wheezing or shortness of breath. 05/21/18  Yes Tanda Rockers, MD  anastrozole (ARIMIDEX) 1 MG tablet Take 1 tablet (1 mg total) by mouth daily. 02/06/19  Yes Nicholas Lose, MD  aspirin EC 81 MG tablet Take 81 mg by mouth daily.   Yes [provider]  atorvastatin (LIPITOR) 20 MG tablet Take 1 tablet (20 mg total) by mouth daily. 02/12/19  Yes Benay Pike, MD  erythromycin ophthalmic ointment Place 1 application into both eyes at bedtime.   Yes [provider]  fluticasone (FLONASE) 50 MCG/ACT nasal spray Place 2 sprays into both nostrils daily as needed for allergies. 08/06/18  Yes Benay Pike, MD  gabapentin (NEURONTIN) 300 MG capsule Take 2 capsules (600 mg total) by mouth at bedtime. 2 at bedtime 02/06/18  Yes Nicholas Lose, MD  Guaifenesin (MUCINEX MAXIMUM STRENGTH) 1200 MG TB12 Take 1,200 mg by mouth 2 (two) times daily as needed (chest congestion).    Yes [provider]  hydroxychloroquine (PLAQUENIL) 200 MG tablet Take 1 tablet by mouth twice daily 02/18/19  Yes Tanda Rockers, MD  ibuprofen (ADVIL,MOTRIN) 200 MG tablet Take 400 mg by mouth daily as needed for moderate pain.   Yes [provider]  metoprolol tartrate (LOPRESSOR) 25 MG tablet Take 1 tablet (25 mg total) by mouth 2 (two) times daily. 06/07/18  Yes Benay Pike, MD  Multiple Vitamin (MULTIVITAMIN) capsule Take 1 capsule by mouth daily.   Yes [provider]  omeprazole (PRILOSEC) 20 MG capsule TAKE 1 CAPSULE BY MOUTH ONCE DAILY 10/12/18  Yes Donnamae Jude, MD  OXYGEN 2lpm with rest and 3-4 lpm with exertion   Yes  [provider]  senna (SENOKOT) 8.6 MG TABS tablet Take 1 tablet (8.6 mg total) by mouth 2 (two) times daily. 12/22/18  Yes Bonnielee Haff, MD  sertraline (ZOLOFT) 50 MG tablet Take 1 tablet (50 mg total) by mouth daily. 05/22/18  Yes Benay Pike, MD  Tiotropium Bromide-Olodaterol (STIOLTO RESPIMAT) 2.5-2.5 MCG/ACT AERS Inhale 2 puffs into the lungs daily. 12/30/18  Yes Tanda Rockers, MD  triamterene-hydrochlorothiazide (BJYNWGN-56) 37.5-25 MG tablet TAKE 1 TABLET BY MOUTH ONCE DAILY Patient taking differently: Take 1 tablet by mouth daily.  01/18/18  Yes Tanda Rockers, MD  doxycycline (VIBRA-TABS) 100 MG tablet Take 1 tablet (100 mg total) by mouth 2 (two) times daily. Patient not taking: Reported on 02/19/2019 01/27/19   Parrett, Fonnie Mu, NP  predniSONE (DELTASONE) 10 MG tablet 2 daily until better  then 1 daily Patient not taking: Reported on 02/19/2019 12/30/18   Tanda Rockers, MD  predniSONE (DELTASONE) 20 MG tablet Take 3 tablets orally daily for 3 days, then 2 tablets daily for 3 days, then 1 tablet daily for 3 days, then the usual dose of prednisone. Patient not taking: Reported on 02/19/2019 12/22/18   Bonnielee Haff, MD  Tiotropium Bromide-Olodaterol (STIOLTO RESPIMAT) 2.5-2.5 MCG/ACT AERS Inhale 2 puffs into the lungs daily. Patient not taking: Reported on 02/19/2019 01/27/19   Tanda Rockers, MD    Current Facility-Administered Medications  Medication Dose Route Frequency Provider Last Rate Last Dose  . 0.9 %  sodium chloride infusion   Intravenous Continuous Rise Patience, MD 75 mL/hr at 02/19/19 2353    . acetaminophen (TYLENOL) tablet 650 mg  650 mg Oral Q6H PRN Rise Patience, MD       Or  . acetaminophen (TYLENOL) suppository 650 mg  650 mg Rectal Q6H PRN Rise Patience, MD      . albuterol (PROVENTIL) (2.5 MG/3ML) 0.083% nebulizer solution 3 mL  3 mL Inhalation Q6H PRN Rise Patience, MD      . anastrozole (ARIMIDEX) tablet 1 mg  1 mg Oral Daily  Rise Patience, MD      . atorvastatin (LIPITOR) tablet 20 mg  20 mg Oral Daily Rise Patience, MD      . fluticasone (FLONASE) 50 MCG/ACT nasal spray 2 spray  2 spray Each Nare Daily PRN Rise Patience, MD      . gabapentin (NEURONTIN) capsule 600 mg  600 mg Oral QHS Rise Patience, MD   600 mg at 02/20/19 0006  . hydroxychloroquine (PLAQUENIL) tablet 200 mg  200 mg Oral BID Rise Patience, MD      . methylPREDNISolone sodium succinate (SOLU-MEDROL) 40 mg/mL injection 20 mg  20 mg Intravenous Daily Rise Patience, MD      . metoprolol tartrate (LOPRESSOR) tablet 25 mg  25 mg Oral BID Rise Patience, MD      . pantoprazole (PROTONIX) injection 40 mg  40 mg Intravenous Q12H Rise Patience, MD   40 mg at 02/20/19 0005  . sertraline (ZOLOFT) tablet 50 mg  50 mg Oral Daily Rise Patience, MD        Allergies as of 02/19/2019 - Review Complete 02/19/2019  Allergen Reaction Noted  . Codeine Other (See Comments) 05/22/2012    Family History  Problem Relation Age of Onset  . Hyperlipidemia Mother   . Asthma Mother   . Lupus Mother   . Breast cancer Mother 38  . Hypertension Father   . Hyperlipidemia Father   . Hypertension Sister   . Hypertension Brother   . Cancer Neg Hx   . Diabetes Neg Hx   . Coronary artery disease Neg Hx     Social History   Socioeconomic History  . Marital status: Single    Spouse name: Not on file  . Number of children: Not on file  . Years of education: Not on file  . Highest education level: Not on file  Occupational History  . Not on file  Social Needs  . Financial resource strain: Not on file  . Food insecurity:    Worry: Not on file    Inability: Not on file  . Transportation needs:    Medical: Not on file    Non-medical: Not on file  Tobacco Use  . Smoking status: Former Smoker  Packs/day: 1.00    Years: 20.00    Pack years: 20.00    Last attempt to quit: 11/14/2003    Years since  quitting: 15.2  . Smokeless tobacco: Never Used  Substance and Sexual Activity  . Alcohol use: No  . Drug use: No  . Sexual activity: Not on file  Lifestyle  . Physical activity:    Days per week: Not on file    Minutes per session: Not on file  . Stress: Not on file  Relationships  . Social connections:    Talks on phone: Not on file    Gets together: Not on file    Attends religious service: Not on file    Active member of club or organization: Not on file    Attends meetings of clubs or organizations: Not on file    Relationship status: Not on file  . Intimate partner violence:    Fear of current or ex partner: Not on file    Emotionally abused: Not on file    Physically abused: Not on file    Forced sexual activity: Not on file  Other Topics Concern  . Not on file  Social History Narrative   Lives with mother and his 3 children.  Works as a Clinical research associate for [employer], product/process development)          Review of Systems: All systems reviewed and negative except where noted in HPI.  Physical Exam: Vital signs in last 24 hours: Temp:  [97.7 F (36.5 C)-99 F (37.2 C)] 97.7 F (36.5 C) (04/09 0530) Pulse Rate:  [67-94] 67 (04/09 0530) Resp:  [12-18] 16 (04/09 0530) BP: (123-165)/(62-83) 136/83 (04/09 0530) SpO2:  [94 %-100 %] 100 % (04/09 0530) Weight:  [90.4 kg] 90.4 kg (04/08 1920) Last BM Date: 02/19/19 General:   Alert, well-developed,  female in NAD Psych:  Pleasant, cooperative. Normal mood and affect. Eyes:  Pupils equal, sclera clear, no icterus.   Conjunctiva pink. Ears:  Normal auditory acuity. Nose:  No deformity, discharge,  or lesions. Neck:  Supple; no masses Lungs:  Clear throughout to auscultation.   No wheezes, crackles, or rhonchi.  Heart:  Regular rate and rhythm; no murmurs, no lower extremity edema Abdomen:  Soft, non-distended, nontender, BS active, no palp mass    Rectal:  Deferred  Msk:  Symmetrical without gross deformities. . Neurologic:  Alert and   oriented x4;  grossly normal neurologically. Skin:  Intact without significant lesions or rashes.   Intake/Output from previous day: No intake/output data recorded. Intake/Output this shift: No intake/output data recorded.  Lab Results: Recent Labs    02/19/19 2354 02/20/19 0321 02/20/19 0652  WBC 9.1 9.1 8.6  HGB 8.0* 8.2* 8.4*  HCT 27.5* 27.4* 28.2*  PLT 221 245 234   BMET Recent Labs    02/19/19 2005 02/20/19 0321  NA 143 143  K 3.8 3.6  CL 106 106  CO2 30 30  GLUCOSE 139* 101*  BUN 16 18  CREATININE 1.12* 1.05*  CALCIUM 9.1 8.7*   LFT Recent Labs    02/19/19 2005  PROT 7.3  ALBUMIN 3.8  AST 17  ALT 15  ALKPHOS 73  BILITOT 0.4   PT/INR No results for input(s): LABPROT, INR in the last 72 hours. Hepatitis Panel No results for input(s): HEPBSAG, HCVAB, HEPAIGM, HEPBIGM in the last 72 hours.    Studies/Results: Ct Abdomen Pelvis W Contrast  Result Date: 02/19/2019 CLINICAL DATA:  Abd pain, diverticulitis suspected. Patient reports dark  blood in stool today. Left flank pain. EXAM: CT ABDOMEN AND PELVIS WITH CONTRAST TECHNIQUE: Multidetector CT imaging of the abdomen and pelvis was performed using the standard protocol following bolus administration of intravenous contrast. CONTRAST:  195mL OMNIPAQUE IOHEXOL 300 MG/ML  SOLN COMPARISON:  Abdominal CT 04/17/2015 FINDINGS: Lower chest: Irregular nodular opacities at the lung bases appear stable. To 2016 CT and are consistent with scarring. 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 No acute airspace disease or pleural effusion. Hepatobiliary: Slight nodular hepatic contours appear similar to prior exam. No focal hepatic abnormality. No calcified gallstone, biliary dilatation, or pericholecystic inflammation. Pancreas: No ductal dilatation or inflammation. Spleen: Normal in size without focal abnormality. Adrenals/Urinary Tract: Normal adrenal glands. No hydronephrosis or perinephric edema. Homogeneous renal  enhancement with symmetric excretion on delayed phase imaging. Right renal stone on prior CT is possibly identified but obscured by IV contrast small bilateral renal cysts. Urinary bladder is physiologically distended without wall thickening. Stomach/Bowel: Ingested material distends the stomach. No gastric wall thickening. No small bowel obstruction, wall thickening, or inflammatory change. Slight fecalization of distal small bowel contents. Normal appendix. Moderate stool burden throughout the colon. Mild diverticulosis of the descending and sigmoid colon. No acute diverticulitis. No colonic wall thickening or inflammatory change. Vascular/Lymphatic: Aorta bi-iliac atherosclerosis. No aneurysm. Prominent lymph nodes in the upper abdomen are stable to diminished in the interim. Peripancreatic node measuring 10 mm, image 7 series 7 is previously 13 mm. Left periaortic node at the level of the mid kidney measures 11 mm image 24 series 2, previously 13 mm. Previous perigastric node has resolved and is no longer seen. Reproductive: Status post hysterectomy. No adnexal masses. Other: No free air, free fluid, or intra-abdominal fluid collection. Tiny fat containing umbilical hernia Musculoskeletal: There are no acute or suspicious osseous abnormalities. IMPRESSION: 1. Colonic diverticulosis without diverticulitis. No acute findings in the abdomen/pelvis. 2. Additional chronic findings are stable from 2016 exam as described. Aortic Atherosclerosis (ICD10-I70.0). Electronically Signed   By: Keith Rake M.D.   On: 02/19/2019 21:33     Tye Savoy, NP-C @  02/20/2019, 9:22 AM     Attending physician's note   I have taken a history, examined the patient and reviewed the chart. I agree with the Advanced Practitioner's note, impression and recommendations.  86 yr F with sarcoidosis, COPD on home O2, admitted with hematochezia  Hgb baseline 10-11 dropped to 8  Denies Melena or abdominal pain. NSAID use  intermittently  Likely etiology lower GI bleed, differential includes diverticular hemorrhage, hemorrhoids, neoplastic lesion or AVM  Will plan to proceed with colonoscopy to identify source of bleeding, and therapeutic management.  The risks and benefits as well as alternatives of endoscopic procedure(s) have been discussed and reviewed. All questions answered. The patient agrees to proceed.  Bowel prep Clear liquids    Raliegh Ip Denzil Magnuson , MD [phone]

## 2019-02-20 NOTE — H&P (View-Only) (Signed)
Referring Provider: [facility]   Primary Care Physician:  Benay Pike, MD Primary Gastroenterologist:  Previously Verl Blalock, MD    Reason for Consultation: GI bleed    ASSESSMENT / PLAN:    21.  58 year old female here with gastrointestinal bleeding, presumably lower and acute on chronic [place] anemia.  Hemoglobin 8.4 , down approximately 1 g from baseline.  She developed nagging left flank pain 1 day prior to the onset of bleeding which may or may not be related.  No evidence for ischemic colitis or other acute findings on CT scan with contrast.  This could be a diverticular hemorrhage .  Seems unlikely to be upper GI bleed with normal BUN.  Additionally she describes brown stools with dark red blood.  -Last colonoscopy was in 2010.  She is due for repeat colonoscopy which would need to be done in the hospital anyway as patient is on oxygen.  Will probably proceed with inpatient colonoscopy.  I will consent her for EGD as well in case colonoscopy negative.  -clear liquids -check iron studies  2. Sarcoidosis / COPD home O2.  She is on prednisone.  3.  AKI, resolving  4.  GERD, asymptomatic on daily PPI   HPI:      HPI: [name] is a 58 y.o. female with history of breast cancer, status post resection.  She is on Arimadex.  Sarcoidosis.  She is on home O2 and currently taking prednisone.  Colonoscopy by [place] 10 years ago for evaluation of anemia.  No findings to explain anemia.  Patient is due for another colonoscopy, our office contact her for previsit but had to postpone it secondary to COVID-19 pandemic.   Couple days ago patient developed left flank pain.  She describes the pain as a nagging discomfort not better or worse with eating.  Not positional.  Yesterday she had 3 brown stools with dark red blood.  She describes the blood is more of a burgundy color.  No upper abdominal pain, nausea or vomiting.  She took ibuprofen for the left flank pain but other than that  has not had any insight use.  No urinary symptoms.  She has a lingering cough since being patient admission for pneumonia.  Ms. Northrop has a history of GERD.  She takes Prilosec on a daily basis with good control of symptoms.    Past Medical History:  Diagnosis Date  . Acute on chronic respiratory failure ([place]) 10/09/2016  . Allergic rhinitis   . Breast cancer ([place]) 2016   right breast  . COPD, mild ([place]) FOLLOWED BY DR Melvyn Novas  . GERD (gastroesophageal reflux disease)   . HTN (hypertension)   . Hypertension   . Iron deficiency anemia   . Long-term current use of steroids SYMBICORT INHALER  . 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 Overweight (BMI 25.0-29.9) 05/11/2016  . Personal history of radiation therapy 2016  . Sarcoidosis STABLE PER CXR JUNE 2013  . Shortness of breath   . Sickle cell trait [facility])     Past Surgical History:  Procedure Laterality Date  . ADJUSTABLE SUTURE MANIPULATION  05/22/2012   Procedure: ADJUSTABLE SUTURE MANIPULATION;  Surgeon: Dara Hoyer, MD;  Location: [facility];  Service: Ophthalmology;  Laterality: Right;  . BREAST LUMPECTOMY Right 2016  . CESAREAN SECTION  1986   W/ BILATERAL TUBAL LIGATION  . EYE SURGERY    . MEDIAN RECTUS REPAIR  05/22/2012   Procedure: MEDIAN RECTUS REPAIR;  Surgeon:  Dara Hoyer, MD;  Location: [facility];  Service: Ophthalmology;  Laterality: Bilateral;  INFERIOR RECTUS RESECTION WITH ADJUSTIBLE SUTURES RIGHT EYE   . RADIOACTIVE SEED GUIDED PARTIAL MASTECTOMY WITH AXILLARY SENTINEL LYMPH NODE BIOPSY Right 08/18/2015   Procedure: RADIOACTIVE SEED GUIDED PARTIAL MASTECTOMY WITH AXILLARY SENTINEL LYMPH NODE BIOPSY;  Surgeon: Stark Klein, MD;  Location: [place];  Service: General;  Laterality: Right;  . TOTAL ROBOTIC ASSISTED LAPAROSCOPIC HYSTERECTOMY  12-30-2010   SYMPTOMATIC UTERINE FIBROIDS  . UPPER TEETH EXTRACTION'S  1992    Prior to Admission medications   Medication Sig Start  Date End Date Taking? Authorizing Provider  albuterol (PROVENTIL) (2.5 MG/3ML) 0.083% nebulizer solution Take 3 mLs (2.5 mg total) by nebulization every 6 (six) hours as needed for wheezing or shortness of breath. 12/22/18  Yes Bonnielee Haff, MD  albuterol (VENTOLIN HFA) 108 (90 Base) MCG/ACT inhaler Inhale 2 puffs into the lungs every 6 (six) hours as needed for wheezing or shortness of breath. 05/21/18  Yes Tanda Rockers, MD  anastrozole (ARIMIDEX) 1 MG tablet Take 1 tablet (1 mg total) by mouth daily. 02/06/19  Yes Nicholas Lose, MD  aspirin EC 81 MG tablet Take 81 mg by mouth daily.   Yes [provider]  atorvastatin (LIPITOR) 20 MG tablet Take 1 tablet (20 mg total) by mouth daily. 02/12/19  Yes Benay Pike, MD  erythromycin ophthalmic ointment Place 1 application into both eyes at bedtime.   Yes [provider]  fluticasone (FLONASE) 50 MCG/ACT nasal spray Place 2 sprays into both nostrils daily as needed for allergies. 08/06/18  Yes Benay Pike, MD  gabapentin (NEURONTIN) 300 MG capsule Take 2 capsules (600 mg total) by mouth at bedtime. 2 at bedtime 02/06/18  Yes Nicholas Lose, MD  Guaifenesin (MUCINEX MAXIMUM STRENGTH) 1200 MG TB12 Take 1,200 mg by mouth 2 (two) times daily as needed (chest congestion).    Yes [provider]  hydroxychloroquine (PLAQUENIL) 200 MG tablet Take 1 tablet by mouth twice daily 02/18/19  Yes Tanda Rockers, MD  ibuprofen (ADVIL,MOTRIN) 200 MG tablet Take 400 mg by mouth daily as needed for moderate pain.   Yes [provider]  metoprolol tartrate (LOPRESSOR) 25 MG tablet Take 1 tablet (25 mg total) by mouth 2 (two) times daily. 06/07/18  Yes Benay Pike, MD  Multiple Vitamin (MULTIVITAMIN) capsule Take 1 capsule by mouth daily.   Yes [provider]  omeprazole (PRILOSEC) 20 MG capsule TAKE 1 CAPSULE BY MOUTH ONCE DAILY 10/12/18  Yes Donnamae Jude, MD  OXYGEN 2lpm with rest and 3-4 lpm with exertion   Yes  [provider]  senna (SENOKOT) 8.6 MG TABS tablet Take 1 tablet (8.6 mg total) by mouth 2 (two) times daily. 12/22/18  Yes Bonnielee Haff, MD  sertraline (ZOLOFT) 50 MG tablet Take 1 tablet (50 mg total) by mouth daily. 05/22/18  Yes Benay Pike, MD  Tiotropium Bromide-Olodaterol (STIOLTO RESPIMAT) 2.5-2.5 MCG/ACT AERS Inhale 2 puffs into the lungs daily. 12/30/18  Yes Tanda Rockers, MD  triamterene-hydrochlorothiazide (VQXIHWT-88) 37.5-25 MG tablet TAKE 1 TABLET BY MOUTH ONCE DAILY Patient taking differently: Take 1 tablet by mouth daily.  01/18/18  Yes Tanda Rockers, MD  doxycycline (VIBRA-TABS) 100 MG tablet Take 1 tablet (100 mg total) by mouth 2 (two) times daily. Patient not taking: Reported on 02/19/2019 01/27/19   Parrett, Fonnie Mu, NP  predniSONE (DELTASONE) 10 MG tablet 2 daily until better  then 1 daily Patient not taking: Reported on 02/19/2019 12/30/18   Tanda Rockers, MD  predniSONE (DELTASONE) 20 MG tablet Take 3 tablets orally daily for 3 days, then 2 tablets daily for 3 days, then 1 tablet daily for 3 days, then the usual dose of prednisone. Patient not taking: Reported on 02/19/2019 12/22/18   Bonnielee Haff, MD  Tiotropium Bromide-Olodaterol (STIOLTO RESPIMAT) 2.5-2.5 MCG/ACT AERS Inhale 2 puffs into the lungs daily. Patient not taking: Reported on 02/19/2019 01/27/19   Tanda Rockers, MD    Current Facility-Administered Medications  Medication Dose Route Frequency Provider Last Rate Last Dose  . 0.9 %  sodium chloride infusion   Intravenous Continuous Rise Patience, MD 75 mL/hr at 02/19/19 2353    . acetaminophen (TYLENOL) tablet 650 mg  650 mg Oral Q6H PRN Rise Patience, MD       Or  . acetaminophen (TYLENOL) suppository 650 mg  650 mg Rectal Q6H PRN Rise Patience, MD      . albuterol (PROVENTIL) (2.5 MG/3ML) 0.083% nebulizer solution 3 mL  3 mL Inhalation Q6H PRN Rise Patience, MD      . anastrozole (ARIMIDEX) tablet 1 mg  1 mg Oral Daily  Rise Patience, MD      . atorvastatin (LIPITOR) tablet 20 mg  20 mg Oral Daily Rise Patience, MD      . fluticasone (FLONASE) 50 MCG/ACT nasal spray 2 spray  2 spray Each Nare Daily PRN Rise Patience, MD      . gabapentin (NEURONTIN) capsule 600 mg  600 mg Oral QHS Rise Patience, MD   600 mg at 02/20/19 0006  . hydroxychloroquine (PLAQUENIL) tablet 200 mg  200 mg Oral BID Rise Patience, MD      . methylPREDNISolone sodium succinate (SOLU-MEDROL) 40 mg/mL injection 20 mg  20 mg Intravenous Daily Rise Patience, MD      . metoprolol tartrate (LOPRESSOR) tablet 25 mg  25 mg Oral BID Rise Patience, MD      . pantoprazole (PROTONIX) injection 40 mg  40 mg Intravenous Q12H Rise Patience, MD   40 mg at 02/20/19 0005  . sertraline (ZOLOFT) tablet 50 mg  50 mg Oral Daily Rise Patience, MD        Allergies as of 02/19/2019 - Review Complete 02/19/2019  Allergen Reaction Noted  . Codeine Other (See Comments) 05/22/2012    Family History  Problem Relation Age of Onset  . Hyperlipidemia Mother   . Asthma Mother   . Lupus Mother   . Breast cancer Mother 77  . Hypertension Father   . Hyperlipidemia Father   . Hypertension Sister   . Hypertension Brother   . Cancer Neg Hx   . Diabetes Neg Hx   . Coronary artery disease Neg Hx     Social History   Socioeconomic History  . Marital status: Single    Spouse name: Not on file  . Number of children: Not on file  . Years of education: Not on file  . Highest education level: Not on file  Occupational History  . Not on file  Social Needs  . Financial resource strain: Not on file  . Food insecurity:    Worry: Not on file    Inability: Not on file  . Transportation needs:    Medical: Not on file    Non-medical: Not on file  Tobacco Use  . Smoking status: Former Smoker  Packs/day: 1.00    Years: 20.00    Pack years: 20.00    Last attempt to quit: 11/14/2003    Years since  quitting: 15.2  . Smokeless tobacco: Never Used  Substance and Sexual Activity  . Alcohol use: No  . Drug use: No  . Sexual activity: Not on file  Lifestyle  . Physical activity:    Days per week: Not on file    Minutes per session: Not on file  . Stress: Not on file  Relationships  . Social connections:    Talks on phone: Not on file    Gets together: Not on file    Attends religious service: Not on file    Active member of club or organization: Not on file    Attends meetings of clubs or organizations: Not on file    Relationship status: Not on file  . Intimate partner violence:    Fear of current or ex partner: Not on file    Emotionally abused: Not on file    Physically abused: Not on file    Forced sexual activity: Not on file  Other Topics Concern  . Not on file  Social History Narrative   Lives with mother and his 3 children.  Works as a Clinical research associate for [employer], product/process development)          Review of Systems: All systems reviewed and negative except where noted in HPI.  Physical Exam: Vital signs in last 24 hours: Temp:  [97.7 F (36.5 C)-99 F (37.2 C)] 97.7 F (36.5 C) (04/09 0530) Pulse Rate:  [67-94] 67 (04/09 0530) Resp:  [12-18] 16 (04/09 0530) BP: (123-165)/(62-83) 136/83 (04/09 0530) SpO2:  [94 %-100 %] 100 % (04/09 0530) Weight:  [90.4 kg] 90.4 kg (04/08 1920) Last BM Date: 02/19/19 General:   Alert, well-developed,  female in NAD Psych:  Pleasant, cooperative. Normal mood and affect. Eyes:  Pupils equal, sclera clear, no icterus.   Conjunctiva pink. Ears:  Normal auditory acuity. Nose:  No deformity, discharge,  or lesions. Neck:  Supple; no masses Lungs:  Clear throughout to auscultation.   No wheezes, crackles, or rhonchi.  Heart:  Regular rate and rhythm; no murmurs, no lower extremity edema Abdomen:  Soft, non-distended, nontender, BS active, no palp mass    Rectal:  Deferred  Msk:  Symmetrical without gross deformities. . Neurologic:  Alert and   oriented x4;  grossly normal neurologically. Skin:  Intact without significant lesions or rashes.   Intake/Output from previous day: No intake/output data recorded. Intake/Output this shift: No intake/output data recorded.  Lab Results: Recent Labs    02/19/19 2354 02/20/19 0321 02/20/19 0652  WBC 9.1 9.1 8.6  HGB 8.0* 8.2* 8.4*  HCT 27.5* 27.4* 28.2*  PLT 221 245 234   BMET Recent Labs    02/19/19 2005 02/20/19 0321  NA 143 143  K 3.8 3.6  CL 106 106  CO2 30 30  GLUCOSE 139* 101*  BUN 16 18  CREATININE 1.12* 1.05*  CALCIUM 9.1 8.7*   LFT Recent Labs    02/19/19 2005  PROT 7.3  ALBUMIN 3.8  AST 17  ALT 15  ALKPHOS 73  BILITOT 0.4   PT/INR No results for input(s): LABPROT, INR in the last 72 hours. Hepatitis Panel No results for input(s): HEPBSAG, HCVAB, HEPAIGM, HEPBIGM in the last 72 hours.    Studies/Results: Ct Abdomen Pelvis W Contrast  Result Date: 02/19/2019 CLINICAL DATA:  Abd pain, diverticulitis suspected. Patient reports dark  blood in stool today. Left flank pain. EXAM: CT ABDOMEN AND PELVIS WITH CONTRAST TECHNIQUE: Multidetector CT imaging of the abdomen and pelvis was performed using the standard protocol following bolus administration of intravenous contrast. CONTRAST:  166mL OMNIPAQUE IOHEXOL 300 MG/ML  SOLN COMPARISON:  Abdominal CT 04/17/2015 FINDINGS: Lower chest: Irregular nodular opacities at the lung bases appear stable. To 2016 CT and are consistent with scarring. 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 No acute airspace disease or pleural effusion. Hepatobiliary: Slight nodular hepatic contours appear similar to prior exam. No focal hepatic abnormality. No calcified gallstone, biliary dilatation, or pericholecystic inflammation. Pancreas: No ductal dilatation or inflammation. Spleen: Normal in size without focal abnormality. Adrenals/Urinary Tract: Normal adrenal glands. No hydronephrosis or perinephric edema. Homogeneous renal  enhancement with symmetric excretion on delayed phase imaging. Right renal stone on prior CT is possibly identified but obscured by IV contrast small bilateral renal cysts. Urinary bladder is physiologically distended without wall thickening. Stomach/Bowel: Ingested material distends the stomach. No gastric wall thickening. No small bowel obstruction, wall thickening, or inflammatory change. Slight fecalization of distal small bowel contents. Normal appendix. Moderate stool burden throughout the colon. Mild diverticulosis of the descending and sigmoid colon. No acute diverticulitis. No colonic wall thickening or inflammatory change. Vascular/Lymphatic: Aorta bi-iliac atherosclerosis. No aneurysm. Prominent lymph nodes in the upper abdomen are stable to diminished in the interim. Peripancreatic node measuring 10 mm, image 7 series 7 is previously 13 mm. Left periaortic node at the level of the mid kidney measures 11 mm image 24 series 2, previously 13 mm. Previous perigastric node has resolved and is no longer seen. Reproductive: Status post hysterectomy. No adnexal masses. Other: No free air, free fluid, or intra-abdominal fluid collection. Tiny fat containing umbilical hernia Musculoskeletal: There are no acute or suspicious osseous abnormalities. IMPRESSION: 1. Colonic diverticulosis without diverticulitis. No acute findings in the abdomen/pelvis. 2. Additional chronic findings are stable from 2016 exam as described. Aortic Atherosclerosis (ICD10-I70.0). Electronically Signed   By: Keith Rake M.D.   On: 02/19/2019 21:33     Tye Savoy, NP-C @  02/20/2019, 9:22 AM     Attending physician's note   I have taken a history, examined the patient and reviewed the chart. I agree with the Advanced Practitioner's note, impression and recommendations.  36 yr F with sarcoidosis, COPD on home O2, admitted with hematochezia  Hgb baseline 10-11 dropped to 8  Denies Melena or abdominal pain. NSAID use  intermittently  Likely etiology lower GI bleed, differential includes diverticular hemorrhage, hemorrhoids, neoplastic lesion or AVM  Will plan to proceed with colonoscopy to identify source of bleeding, and therapeutic management.  The risks and benefits as well as alternatives of endoscopic procedure(s) have been discussed and reviewed. All questions answered. The patient agrees to proceed.  Bowel prep Clear liquids    Raliegh Ip Denzil Magnuson , MD [phone]

## 2019-02-20 NOTE — Progress Notes (Signed)
PROGRESS NOTE  TRENA DUNAVAN Cabrera:[id number] DOB: 30-Jan-1961 DOA: 02/19/2019 PCP: Benay Pike, MD   LOS: 0 days   Brief narrative: [name] is a 58 y.o. female with history of COPD, sarcoidosis, breast cancer, chronic anemia admitted in the month of February 2 months ago for multifocal pneumonia presented to the ER after patient had 3 episodes of rectal bleeding.  Patient states on the night of February 18, 2019 patient start developing some left quadrant pain.  This morning patient had progressively worsening rectal bleeding which was frank blood.  Had 3 episodes.  Denies any nausea vomiting or dizziness or loss of consciousness or chest pain or shortness of breath denies any fever chills or recent contact with COVID-19.  Had taken 2 tablets of Motrin for the left lower quadrant pain but states that she usually does not take NSAIDs.  Patient is on aspirin.  Patient states she is already scheduled to have a colonoscopy last month but was postponed because of recent pandemic.   In the ER, patient's lab work show hemoglobin of around 9.1 platelets 261 WBC 10.3.  Creatinine is 1.1 LFTs were normal.  Patient did not have any further episodes in the ER. Patient had a left lower quadrant pain CAT scan was done which did not show anything acute except for diverticulosis.  Subjective: Patient denies any further episodes of GI bleed.  She does complain of mild lower abdominal pain.  Denies any nausea vomiting, cough or shortness of breath.  Last bowel movement was yesterday  Assessment/Plan:  Principal Problem:   Acute GI bleeding Active Problems:   Sarcoidosis ([place])   HYPERTENSION, BENIGN SYSTEMIC   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    GERD (gastroesophageal reflux disease)   Breast cancer of upper-outer quadrant of right female breast (HCC)   Anemia  Acute GI bleeding likely lower GI bleed due to dark red bleeding.  CT scan showing diverticulosis.  Normal BUN.   Patient was supposed to get colonoscopy as outpatient with [facility].  I spoke with GI for consultation.  GI has recommended inpatient colonoscopy and possible endoscopy if needed.  Patient is hemodynamically stable.  She did have 1 g/dL hemoglobin drop overnight.  Will closely monitor.  If needed will transfuse.  Continue on Protonix IV twice a day.  Patient is chronically on prednisone and she took some NSAIDs for lower back pain recently.  She was advised against NSAIDs.    COPD/sarcoidosis on home oxygen.  Chronic hypoxic respiratory failure.  Not in acute exacerbation.  Not actively wheezing.  Continue oxygen, nebulizers as needed.  Sarcoidosis on hydroxychloroquine and prednisone.   Continue on Solu-Medrol for now.  History of breast cancer on Arimidex.  Acute blood loss anemia has chronic complement.  Follow CBC.  Will transfuse if necessary.  VTE Prophylaxis: Sequential compression device  Code Status: Full code  Family Communication: No one is available at bedside  Disposition Plan: Home likely in 1 to 2 days   Consultants:  [facility]  Procedures:  None  Antibiotics: Anti-infectives (From admission, onward)   Start     Dose/Rate Route Frequency Ordered Stop   02/20/19 0000  hydroxychloroquine (PLAQUENIL) tablet 200 mg     200 mg Oral 2 times daily 02/19/19 2253        Objective: Vitals:   02/19/19 2331 02/20/19 0530  BP: (!) 141/66 136/83  Pulse: 81 67  Resp: 12 16  Temp: 99 F (37.2 C)  97.7 F (36.5 C)  SpO2: 100% 100%    Intake/Output Summary (Last 24 hours) at 02/20/2019 1059 Last data filed at 02/20/2019 1030 Gross per 24 hour  Intake 120 ml  Output -  Net 120 ml   Filed Weights   02/19/19 1920  Weight: 90.4 kg   Body mass index is 33.15 kg/m.   Physical Exam: GENERAL: Patient is alert awake and oriented. Not in obvious distress.  Obese. HENT: No scleral pallor or icterus. Pupils equally reactive to light. Oral mucosa is moist NECK: is supple,  no palpable thyroid enlargement. CHEST: Clear to auscultation. No crackles or wheezes. Non tender on palpation. Diminished breath sounds bilaterally. CVS: S1 and S2 heard, no murmur. Regular rate and rhythm. No pericardial rub. ABDOMEN: Soft, mild tenderness over the left lower quadrant., bowel sounds are present. No palpable hepato-splenomegaly. EXTREMITIES: No edema. CNS: Cranial nerves are intact. No focal motor or sensory deficits. SKIN: warm and dry without rashes.  Data Review: I have personally reviewed the following laboratory data and studies,  CBC: Recent Labs  Lab 02/19/19 2005 02/19/19 2354 02/20/19 0321 02/20/19 0652  WBC 10.3 9.1 9.1 8.6  NEUTROABS 9.0*  --   --   --   HGB 9.1* 8.0* 8.2* 8.4*  HCT 29.6* 27.5* 27.4* 28.2*  MCV 87.8 90.2 88.7 89.2  PLT 261 221 245 440   Basic Metabolic Panel: Recent Labs  Lab 02/19/19 2005 02/20/19 0321  NA 143 143  K 3.8 3.6  CL 106 106  CO2 30 30  GLUCOSE 139* 101*  BUN 16 18  CREATININE 1.12* 1.05*  CALCIUM 9.1 8.7*   Liver Function Tests: Recent Labs  Lab 02/19/19 2005  AST 17  ALT 15  ALKPHOS 73  BILITOT 0.4  PROT 7.3  ALBUMIN 3.8   Recent Labs  Lab 02/19/19 2005  LIPASE 43   No results for input(s): AMMONIA in the last 168 hours. Cardiac Enzymes: No results for input(s): CKTOTAL, CKMB, CKMBINDEX, TROPONINI in the last 168 hours. BNP (last 3 results) No results for input(s): BNP in the last 8760 hours.  ProBNP (last 3 results) No results for input(s): PROBNP in the last 8760 hours.  CBG: No results for input(s): GLUCAP in the last 168 hours. No results found for this or any previous visit (from the past 240 hour(s)).   Studies: Ct Abdomen Pelvis W Contrast  Result Date: 02/19/2019 CLINICAL DATA:  Abd pain, diverticulitis suspected. Patient reports dark blood in stool today. Left flank pain. EXAM: CT ABDOMEN AND PELVIS WITH CONTRAST TECHNIQUE: Multidetector CT imaging of the abdomen and pelvis was  performed using the standard protocol following bolus administration of intravenous contrast. CONTRAST:  168mL OMNIPAQUE IOHEXOL 300 MG/ML  SOLN COMPARISON:  Abdominal CT 04/17/2015 FINDINGS: Lower chest: Irregular nodular opacities at the lung bases appear stable. To 2016 CT and are consistent with scarring. 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 No acute airspace disease or pleural effusion. Hepatobiliary: Slight nodular hepatic contours appear similar to prior exam. No focal hepatic abnormality. No calcified gallstone, biliary dilatation, or pericholecystic inflammation. Pancreas: No ductal dilatation or inflammation. Spleen: Normal in size without focal abnormality. Adrenals/Urinary Tract: Normal adrenal glands. No hydronephrosis or perinephric edema. Homogeneous renal enhancement with symmetric excretion on delayed phase imaging. Right renal stone on prior CT is possibly identified but obscured by IV contrast small bilateral renal cysts. Urinary bladder is physiologically distended without wall thickening. Stomach/Bowel: Ingested material distends the stomach. No gastric wall thickening.  No small bowel obstruction, wall thickening, or inflammatory change. Slight fecalization of distal small bowel contents. Normal appendix. Moderate stool burden throughout the colon. Mild diverticulosis of the descending and sigmoid colon. No acute diverticulitis. No colonic wall thickening or inflammatory change. Vascular/Lymphatic: Aorta bi-iliac atherosclerosis. No aneurysm. Prominent lymph nodes in the upper abdomen are stable to diminished in the interim. Peripancreatic node measuring 10 mm, image 7 series 7 is previously 13 mm. Left periaortic node at the level of the mid kidney measures 11 mm image 24 series 2, previously 13 mm. Previous perigastric node has resolved and is no longer seen. Reproductive: Status post hysterectomy. No adnexal masses. Other: No free air, free fluid, or intra-abdominal fluid  collection. Tiny fat containing umbilical hernia Musculoskeletal: There are no acute or suspicious osseous abnormalities. IMPRESSION: 1. Colonic diverticulosis without diverticulitis. No acute findings in the abdomen/pelvis. 2. Additional chronic findings are stable from 2016 exam as described. Aortic Atherosclerosis (ICD10-I70.0). Electronically Signed   By: Keith Rake M.D.   On: 02/19/2019 21:33    Scheduled Meds: . anastrozole  1 mg Oral Daily  . atorvastatin  20 mg Oral Daily  . gabapentin  600 mg Oral QHS  . hydroxychloroquine  200 mg Oral BID  . methylPREDNISolone (SOLU-MEDROL) injection  20 mg Intravenous Daily  . metoprolol tartrate  25 mg Oral BID  . pantoprazole (PROTONIX) IV  40 mg Intravenous Q12H  . sertraline  50 mg Oral Daily    Continuous Infusions: . sodium chloride 75 mL/hr at 02/19/19 2353     Flora Lipps, MD  [facility] 02/20/2019

## 2019-02-21 ENCOUNTER — Observation Stay (HOSPITAL_COMMUNITY): Payer: BLUE CROSS/BLUE SHIELD | Admitting: Anesthesiology

## 2019-02-21 ENCOUNTER — Encounter (HOSPITAL_COMMUNITY): Payer: Self-pay | Admitting: Internal Medicine

## 2019-02-21 ENCOUNTER — Encounter (HOSPITAL_COMMUNITY)
Admission: EM | Disposition: A | Discharge status: Home / Self Care | Payer: Self-pay | Source: Home / Self Care | Attending: Emergency Medicine

## 2019-02-21 DIAGNOSIS — C50411 Malignant neoplasm of upper-outer quadrant of right female breast: Secondary | ICD-10-CM | POA: Diagnosis not present

## 2019-02-21 DIAGNOSIS — J449 Chronic obstructive pulmonary disease, unspecified: Secondary | ICD-10-CM | POA: Diagnosis not present

## 2019-02-21 DIAGNOSIS — K5730 Diverticulosis of large intestine without perforation or abscess without bleeding: Secondary | ICD-10-CM

## 2019-02-21 DIAGNOSIS — K648 Other hemorrhoids: Secondary | ICD-10-CM

## 2019-02-21 DIAGNOSIS — D649 Anemia, unspecified: Secondary | ICD-10-CM | POA: Diagnosis not present

## 2019-02-21 DIAGNOSIS — K922 Gastrointestinal hemorrhage, unspecified: Secondary | ICD-10-CM | POA: Diagnosis not present

## 2019-02-21 DIAGNOSIS — D122 Benign neoplasm of ascending colon: Secondary | ICD-10-CM

## 2019-02-21 HISTORY — PX: COLONOSCOPY WITH PROPOFOL: SHX5780

## 2019-02-21 HISTORY — PX: POLYPECTOMY: SHX5525

## 2019-02-21 LAB — BASIC METABOLIC PANEL
Anion gap: 9 (ref 5–15)
BUN: 14 mg/dL (ref 6–20)
CO2: 27 mmol/L (ref 22–32)
Calcium: 9.2 mg/dL (ref 8.9–10.3)
Chloride: 108 mmol/L (ref 98–111)
Creatinine, Ser: 0.96 mg/dL (ref 0.44–1.00)
GFR calc Af Amer: >60 mL/min (ref >60)
GFR calc non Af Amer: >60 mL/min (ref >60)
Glucose, Bld: 84 mg/dL (ref 70–99)
Potassium: 3.5 mmol/L (ref 3.5–5.1)
Sodium: 144 mmol/L (ref 135–145)

## 2019-02-21 LAB — CBC
HCT: 28.6 % — ABNORMAL LOW (ref 36.0–46.0)
Hemoglobin: 8.6 g/dL — ABNORMAL LOW (ref 12.0–15.0)
MCH: 26.8 pg (ref 26.0–34.0)
MCHC: 30.1 g/dL (ref 30.0–36.0)
MCV: 89.1 fL (ref 80.0–100.0)
Platelets: 244 10*3/uL (ref 150–400)
RBC: 3.21 MIL/uL — ABNORMAL LOW (ref 3.87–5.11)
RDW: 15.7 % — ABNORMAL HIGH (ref 11.5–15.5)
WBC: 9 10*3/uL (ref 4.0–10.5)
nRBC: 0 % (ref 0.0–0.2)

## 2019-02-21 LAB — MAGNESIUM: Magnesium: 1.9 mg/dL (ref 1.7–2.4)

## 2019-02-21 SURGERY — COLONOSCOPY WITH PROPOFOL
Anesthesia: Monitor Anesthesia Care

## 2019-02-21 MED ORDER — PROPOFOL 10 MG/ML IV BOLUS
INTRAVENOUS | Status: DC | PRN
  Administered 2019-02-21: 20 mg via INTRAVENOUS

## 2019-02-21 MED ORDER — FERROUS SULFATE 325 (65 FE) MG PO TABS: 325.0000 mg | ORAL_TABLET | Freq: Every day | ORAL | 0 refills | Status: DC

## 2019-02-21 MED ORDER — PROPOFOL 10 MG/ML IV BOLUS
INTRAVENOUS | Status: AC
  Filled 2019-02-21: qty 40

## 2019-02-21 MED ORDER — PREDNISONE 10 MG PO TABS: 10.0000 mg | ORAL_TABLET | Freq: Every day | ORAL | Status: DC

## 2019-02-21 MED ORDER — PROPOFOL 500 MG/50ML IV EMUL
INTRAVENOUS | Status: DC | PRN
  Administered 2019-02-21: 150 ug/kg/min via INTRAVENOUS

## 2019-02-21 MED ORDER — ASPIRIN EC 81 MG PO TBEC: 81.0000 mg | DELAYED_RELEASE_TABLET | Freq: Every day | ORAL | Status: DC

## 2019-02-21 MED ORDER — LIDOCAINE 2% (20 MG/ML) 5 ML SYRINGE
INTRAMUSCULAR | Status: DC | PRN
  Administered 2019-02-21: 75 mg via INTRAVENOUS

## 2019-02-21 MED ORDER — LACTATED RINGERS IV SOLN
INTRAVENOUS | Status: DC
  Administered 2019-02-21: 09:00:00 via INTRAVENOUS

## 2019-02-21 SURGICAL SUPPLY — 22 items

## 2019-02-21 NOTE — Transfer of Care (Signed)
Immediate Anesthesia Transfer of Care Note  Patient: [name]  Procedure(s) Performed: COLONOSCOPY WITH PROPOFOL (N/A ) POLYPECTOMY  Patient Location: PACU  Anesthesia Type:MAC  Level of Consciousness: awake, alert  and oriented  Airway & Oxygen Therapy: Patient Spontanous Breathing and Patient connected to nasal cannula oxygen  Post-op Assessment: Report given to RN and Post -op Vital signs reviewed and stable  Post vital signs: Reviewed and stable  Last Vitals:  Vitals Value Taken Time  BP 102/45 02/21/2019  9:45 AM  Temp    Pulse 72 02/21/2019  9:46 AM  Resp 17 02/21/2019  9:46 AM  SpO2 100 % 02/21/2019  9:46 AM  Vitals shown include unvalidated device data.  Last Pain:  Vitals:   02/21/19 0816  TempSrc: Oral  PainSc: 0-No pain         Complications: No apparent anesthesia complications

## 2019-02-21 NOTE — Discharge Summary (Signed)
Physician Discharge Summary  [name] JIR:[id number] DOB: [birth year] DOA: 02/19/2019  PCP: Benay Pike, MD  Admit date: 02/19/2019 Discharge date: 02/21/2019  Admitted From: Home  Discharge disposition: home    Recommendations for Outpatient Follow-Up:    Follow up with your primary care provider in one week.    Discharge Diagnosis:   Principal Problem:   Acute GI bleeding Active Problems:   Sarcoidosis ([place])   HYPERTENSION, BENIGN SYSTEMIC   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    GERD (gastroesophageal reflux disease)   Breast cancer of upper-outer quadrant of right female breast (HCC)   Anemia   Benign neoplasm of ascending colon   Discharge Condition: Improved.  Diet recommendation:  Regular  Wound care: None.  Code status: Full.   History of Present Illness:   [name] a 58 y.o.femalewithhistory of COPD, sarcoidosis, breast cancer, chronic anemia admitted in the month of February 2 months ago for multifocal pneumonia, presented to the ER after patient had 3 episodes of rectal bleeding. Patient states on the night of February 18, 2019 patient start developing some left quadrant pain. Since then patient had progressively worsening rectal bleeding which was frank blood. Had 3 episodes. Denied any nausea vomiting or dizziness or loss of consciousness or chest pain or shortness of breath. Denied any fever, chills or recent contact with COVID-19. Had taken 2 tablets of Motrin for the left lower quadrant pain but states that she usually does not take NSAIDs. Patient is on aspirin. Patient states she is already scheduled to have a colonoscopy last month but was postponed because of recent pandemic.  In the ER, patient's lab work show hemoglobin of around 9.1 platelets 261 WBC 10.3. Creatinine is 1.1 LFTs were normal. Patient did not have any further episodes in the ER. Patient had a left lower quadrant pain so CT  scan was done  which did not show anything acute except for diverticulosis.   Hospital Course:   Acute GI bleeding likely lower GI bleed due to diverticulosis/internal hemorrhoids.  CT scan showed diverticulosis.  Normal BUN.    Status post colonoscopy on 02/21/2019 with findings of sigmoid diverticulosis, one 1 mm polyp in the ascending colon which was removed with cold snare, internal hemorrhoids. Patient remained hemodynamically stable.  Patient will continue PPI on discharge. Patient is chronically on prednisone and was advised against NSAIDs.    COPD/sarcoidosis on home oxygen.  Chronic hypoxic respiratory failure.  Not in acute exacerbation.    Continue oxygen, nebulizers as needed.  Has an appointment with pulmonary as outpatient.  Sarcoidosis on hydroxychloroquine and prednisone.  Continue on discharge  History of breast cancer on Arimidex.  Acute on chronic blood loss anemia likely from hemorrhoids and diverticulosis in the background of sickle cell trait.Marland Kitchen  She was advised to avoid NSAIDs.  Polypectomy was done and will need to follow-up with GI.  She was advised to seek medical attention for worsening symptoms.  Disposition. At this time, patient is stable for disposition home.   Medical Consultants:    GI   Subjective:   Today, patient feels ok.  No further bleeding.  Discharge Exam:   Vitals:   02/21/19 1000 02/21/19 1021  BP: 135/75 125/77  Pulse: 65 64  Resp: 10 20  Temp:  97.7 F (36.5 C)  SpO2: 100% 100%   Vitals:   02/21/19 0945 02/21/19 0950 02/21/19 1000 02/21/19 1021  BP: (!) 102/45 118/63 135/75 125/77  Pulse:  72 70 65 64  Resp: 15 15 10 20   Temp: 97.7 F (36.5 C)   97.7 F (36.5 C)  TempSrc: Oral     SpO2: 100% 100% 100% 100%  Weight:      Height:        General exam: Appears calm and comfortable ,Not in distress.  Obese HEENT:PERRL,Oral mucosa moist Respiratory system: Bilateral equal air entry, normal vesicular breath sounds, no wheezes or crackles   Cardiovascular system: S1 & S2 heard, RRR.  Gastrointestinal system: Abdomen is nondistended, soft and nontender. No organomegaly or masses felt. Normal bowel sounds heard. Central nervous system: Alert and oriented. No focal neurological deficits. Extremities: No edema, no clubbing ,no cyanosis, distal peripheral pulses palpable. Skin: No rashes, lesions or ulcers,no icterus ,no pallor MSK: Normal muscle bulk,tone ,power    Procedures:    Colonoscopy with polypectomy on 4/10/ 2020.  The results of significant diagnostics from this hospitalization (including imaging, microbiology, ancillary and laboratory) are listed below for reference.     Diagnostic Studies:   Ct Abdomen Pelvis W Contrast  Result Date: 02/19/2019 CLINICAL DATA:  Abd pain, diverticulitis suspected. Patient reports dark blood in stool today. Left flank pain. EXAM: CT ABDOMEN AND PELVIS WITH CONTRAST TECHNIQUE: Multidetector CT imaging of the abdomen and pelvis was performed using the standard protocol following bolus administration of intravenous contrast. CONTRAST:  154mL OMNIPAQUE IOHEXOL 300 MG/ML  SOLN COMPARISON:  Abdominal CT 04/17/2015 FINDINGS: Lower chest: Irregular nodular opacities at the lung bases appear stable. To 2016 CT and are consistent with scarring. 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 No acute airspace disease or pleural effusion. Hepatobiliary: Slight nodular hepatic contours appear similar to prior exam. No focal hepatic abnormality. No calcified gallstone, biliary dilatation, or pericholecystic inflammation. Pancreas: No ductal dilatation or inflammation. Spleen: Normal in size without focal abnormality. Adrenals/Urinary Tract: Normal adrenal glands. No hydronephrosis or perinephric edema. Homogeneous renal enhancement with symmetric excretion on delayed phase imaging. Right renal stone on prior CT is possibly identified but obscured by IV contrast small bilateral renal cysts. Urinary  bladder is physiologically distended without wall thickening. Stomach/Bowel: Ingested material distends the stomach. No gastric wall thickening. No small bowel obstruction, wall thickening, or inflammatory change. Slight fecalization of distal small bowel contents. Normal appendix. Moderate stool burden throughout the colon. Mild diverticulosis of the descending and sigmoid colon. No acute diverticulitis. No colonic wall thickening or inflammatory change. Vascular/Lymphatic: Aorta bi-iliac atherosclerosis. No aneurysm. Prominent lymph nodes in the upper abdomen are stable to diminished in the interim. Peripancreatic node measuring 10 mm, image 7 series 7 is previously 13 mm. Left periaortic node at the level of the mid kidney measures 11 mm image 24 series 2, previously 13 mm. Previous perigastric node has resolved and is no longer seen. Reproductive: Status post hysterectomy. No adnexal masses. Other: No free air, free fluid, or intra-abdominal fluid collection. Tiny fat containing umbilical hernia Musculoskeletal: There are no acute or suspicious osseous abnormalities. IMPRESSION: 1. Colonic diverticulosis without diverticulitis. No acute findings in the abdomen/pelvis. 2. Additional chronic findings are stable from 2016 exam as described. Aortic Atherosclerosis (ICD10-I70.0). Electronically Signed   By: Keith Rake M.D.   On: 02/19/2019 21:33     Labs:   Basic Metabolic Panel: Recent Labs  Lab 02/19/19 2005 02/20/19 0321 02/21/19 0504  NA 143 143 144  K 3.8 3.6 3.5  CL 106 106 108  CO2 30 30 27   GLUCOSE 139* 101* 84  BUN 16 18  14  CREATININE 1.12* 1.05* 0.96  CALCIUM 9.1 8.7* 9.2  MG  --   --  1.9   GFR Estimated Creatinine Clearance: 70 mL/min (by C-G formula based on SCr of 0.96 mg/dL). Liver Function Tests: Recent Labs  Lab 02/19/19 2005  AST 17  ALT 15  ALKPHOS 73  BILITOT 0.4  PROT 7.3  ALBUMIN 3.8   Recent Labs  Lab 02/19/19 2005  LIPASE 43   No results for  input(s): AMMONIA in the last 168 hours. Coagulation profile No results for input(s): INR, PROTIME in the last 168 hours.  CBC: Recent Labs  Lab 02/19/19 2005 02/19/19 2354 02/20/19 0321 02/20/19 0652 02/21/19 0504  WBC 10.3 9.1 9.1 8.6 9.0  NEUTROABS 9.0*  --   --   --   --   HGB 9.1* 8.0* 8.2* 8.4* 8.6*  HCT 29.6* 27.5* 27.4* 28.2* 28.6*  MCV 87.8 90.2 88.7 89.2 89.1  PLT 261 221 245 234 244   Cardiac Enzymes: No results for input(s): CKTOTAL, CKMB, CKMBINDEX, TROPONINI in the last 168 hours. BNP: Invalid input(s): POCBNP CBG: No results for input(s): GLUCAP in the last 168 hours. D-Dimer No results for input(s): DDIMER in the last 72 hours. Hgb A1c No results for input(s): HGBA1C in the last 72 hours. Lipid Profile No results for input(s): CHOL, HDL, LDLCALC, TRIG, CHOLHDL, LDLDIRECT in the last 72 hours. Thyroid function studies No results for input(s): TSH, T4TOTAL, T3FREE, THYROIDAB in the last 72 hours.  Invalid input(s): FREET3 Anemia work up Recent Labs    02/20/19 1034  VITAMINB12 382  FOLATE 15.2  FERRITIN 59  TIBC 253  IRON 53  RETICCTPCT 1.8   Microbiology No results found for this or any previous visit (from the past 240 hour(s)).   Discharge Instructions:   Discharge Instructions    Diet general   Complete by:  As directed    Discharge instructions   Complete by:  As directed    Start aspirin from 02/23/2019.  Try to avoid over-the-counter pain medication including Aleve Motrin.  Follow-up with your primary care physician in 1 week.  If you persist to have rectal bleeding, please seek medical attention.  Follow-up with your pulmonary physician as a scheduled by you.  Continue to take senna at home to keep your bowels soft.   Increase activity slowly   Complete by:  As directed      Allergies as of 02/21/2019      Reactions   Codeine Other (See Comments)   Avoids-felt bad when took before      Medication List    STOP taking these  medications   doxycycline 100 MG tablet Commonly known as:  VIBRA-TABS   ibuprofen 200 MG tablet Commonly known as:  ADVIL,MOTRIN     TAKE these medications   albuterol 108 (90 Base) MCG/ACT inhaler Commonly known as:  Ventolin HFA Inhale 2 puffs into the lungs every 6 (six) hours as needed for wheezing or shortness of breath.   albuterol (2.5 MG/3ML) 0.083% nebulizer solution Commonly known as:  PROVENTIL Take 3 mLs (2.5 mg total) by nebulization every 6 (six) hours as needed for wheezing or shortness of breath.   anastrozole 1 MG tablet Commonly known as:  ARIMIDEX Take 1 tablet (1 mg total) by mouth daily.   aspirin EC 81 MG tablet Take 1 tablet (81 mg total) by mouth daily. Start 02/23/19 What changed:  additional instructions   atorvastatin 20 MG tablet Commonly known as:  LIPITOR Take 1 tablet (20 mg total) by mouth daily.   erythromycin ophthalmic ointment Place 1 application into both eyes at bedtime.   ferrous sulfate 325 (65 FE) MG tablet Take 1 tablet (325 mg total) by mouth daily with breakfast.   fluticasone 50 MCG/ACT nasal spray Commonly known as:  FLONASE Place 2 sprays into both nostrils daily as needed for allergies.   gabapentin 300 MG capsule Commonly known as:  NEURONTIN Take 2 capsules (600 mg total) by mouth at bedtime. 2 at bedtime   hydroxychloroquine 200 MG tablet Commonly known as:  PLAQUENIL Take 1 tablet by mouth twice daily   metoprolol tartrate 25 MG tablet Commonly known as:  LOPRESSOR Take 1 tablet (25 mg total) by mouth 2 (two) times daily.   Mucinex Maximum Strength 1200 MG Tb12 Generic drug:  Guaifenesin Take 1,200 mg by mouth 2 (two) times daily as needed (chest congestion).   multivitamin capsule Take 1 capsule by mouth daily.   omeprazole 20 MG capsule Commonly known as:  PRILOSEC TAKE 1 CAPSULE BY MOUTH ONCE DAILY   OXYGEN 2lpm with rest and 3-4 lpm with exertion   predniSONE 10 MG tablet Commonly known as:   DELTASONE Take 1 tablet (10 mg total) by mouth daily with breakfast. 2 daily until better then 1 daily What changed:    how much to take  how to take this  when to take this  Another medication with the same name was removed. Continue taking this medication, and follow the directions you see here.   senna 8.6 MG Tabs tablet Commonly known as:  SENOKOT Take 1 tablet (8.6 mg total) by mouth 2 (two) times daily.   sertraline 50 MG tablet Commonly known as:  ZOLOFT Take 1 tablet (50 mg total) by mouth daily.   Tiotropium Bromide-Olodaterol 2.5-2.5 MCG/ACT Aers Commonly known as:  Stiolto Respimat Inhale 2 puffs into the lungs daily. What changed:  Another medication with the same name was removed. Continue taking this medication, and follow the directions you see here.   triamterene-hydrochlorothiazide 37.5-25 MG tablet Commonly known as:  MAXZIDE-25 TAKE 1 TABLET BY MOUTH ONCE DAILY      Time coordinating discharge: 39 minutes  Signed:  Icelyn Navarrete  [facility] 02/21/2019, 11:25 AM

## 2019-02-21 NOTE — Op Note (Signed)
[facility] Patient Name: [name] Procedure Date: 02/21/2019 MRN: [id number] Attending MD: Docia Chuck. Henrene Pastor , MD Date of Birth: [birth year] CSN: [id number] Age: 58 Admit Type: Inpatient Procedure:                Colonoscopy with cold snare polypectomy x1 Indications:              Rectal bleeding (Minor). Previous colonoscopy with                            Dr. Sharlett Iles 2010- for neoplasia Providers:                Docia Chuck. Henrene Pastor, MD, Burtis Junes, RN, Janie Billups,                            Technician, [facility], CRNA Referring MD:              Medicines:                Monitored Anesthesia Care Complications:            No immediate complications. Estimated blood loss:                            None. Estimated Blood Loss:     Estimated blood loss: none. Procedure:                Pre-Anesthesia Assessment:                           - Prior to the procedure, a History and Physical                            was performed, and patient medications and                            allergies were reviewed. The patient's tolerance of                            previous anesthesia was also reviewed. The risks                            and benefits of the procedure and the sedation                            options and risks were discussed with the patient.                            All questions were answered, and informed consent                            was obtained. Prior Anticoagulants: The patient has                            taken no previous anticoagulant or antiplatelet  agents. ASA Grade Assessment: II - A patient with                            mild systemic disease. After reviewing the risks                            and benefits, the patient was deemed in                            satisfactory condition to undergo the procedure.                           After obtaining informed consent, the colonoscope            was passed under direct vision. Throughout the                            procedure, the patient's blood pressure, pulse, and                            oxygen saturations were monitored continuously. The                            CF-HQ190L ([id number]) Olympus colonoscope was                            introduced through the anus and advanced to the the                            cecum, identified by appendiceal orifice and                            ileocecal valve. The ileocecal valve, appendiceal                            orifice, and rectum were photographed. The quality                            of the bowel preparation was excellent. The                            colonoscopy was performed without difficulty. The                            patient tolerated the procedure well. The bowel                            preparation used was SUPREP via split dose                            instruction. Scope In: 9:19:10 AM Scope Out: 9:35:48 AM Scope Withdrawal Time: 0 hours 10 minutes 42 seconds  Total Procedure Duration: 0 hours 16 minutes 38 seconds  Findings:      A 1 mm polyp was found in the  ascending colon. The polyp was removed       with a cold snare. Resection and retrieval were complete.      Multiple diverticula were found in the sigmoid colon.      Internal hemorrhoids were found during retroflexion. The hemorrhoids       were moderate.      The exam was otherwise without abnormality on direct and retroflexion       views. Impression:               - One 1 mm polyp in the ascending colon, removed                            with a cold snare. Resected and retrieved.                           - Diverticulosis in the sigmoid colon.                           - Internal hemorrhoids. This is the cause for                            bleeding which was self-limited.                           - The examination was otherwise normal on direct                            and  retroflexion views.                           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 Regular diet                           2. We will follow-up pathology. If polyp                            adenomatous then repeat colonoscopy in 7 to 10                            years with Dr. Henrene Pastor                           3. Okay for discharge home today. No routine GI                            follow-up at this time required.                           The procedure findings and recommendations were  reviewed with patient. As well, she was provided a                            copy of this procedure report. We will sign off Procedure Code(s):        --- Professional ---                           [phone], Colonoscopy, flexible; with removal of                            tumor(s), polyp(s), or other lesion(s) by snare                            technique Diagnosis Code(s):        --- Professional ---                           K63.5, Polyp of colon                           K64.8, Other hemorrhoids                           K62.5, Hemorrhage of anus and rectum                           K57.30, Diverticulosis of large intestine without                            perforation or abscess without bleeding CPT copyright 2019 American Medical Association. All rights reserved. The codes documented in this report are preliminary and upon coder review may  be revised to meet current compliance requirements. Docia Chuck. Henrene Pastor, MD 02/21/2019 9:50:38 AM This report has been signed electronically. Number of Addenda: 0

## 2019-02-21 NOTE — Anesthesia Postprocedure Evaluation (Signed)
Anesthesia Post Note  Patient: [name]  Procedure(s) Performed: COLONOSCOPY WITH PROPOFOL (N/A ) POLYPECTOMY     Patient location during evaluation: Endoscopy Anesthesia Type: MAC Level of consciousness: awake and alert Pain management: pain level controlled Vital Signs Assessment: post-procedure vital signs reviewed and stable Respiratory status: spontaneous breathing, nonlabored ventilation, respiratory function stable and patient connected to nasal cannula oxygen Cardiovascular status: stable and blood pressure returned to baseline Postop Assessment: no apparent nausea or vomiting Anesthetic complications: no    Last Vitals:  Vitals:   02/21/19 0950 02/21/19 1000  BP: 118/63 135/75  Pulse: 70 65  Resp: 15 10  Temp:    SpO2: 100% 100%    Last Pain:  Vitals:   02/21/19 0950  TempSrc:   PainSc: 0-No pain                 Delinda Malan,W. EDMOND

## 2019-02-21 NOTE — Anesthesia Preprocedure Evaluation (Addendum)
Anesthesia Evaluation  Patient identified by MRN, date of birth, ID band Patient awake    Reviewed: Allergy & Precautions, H&P , NPO status , Patient's Chart, lab work & pertinent test results, reviewed documented beta blocker date and time   Airway Mallampati: II  TM Distance: >3 FB Neck ROM: Full    Dental no notable dental hx. (+) Edentulous Upper, Edentulous Lower, Dental Advisory Given   Pulmonary COPD,  COPD inhaler, former smoker,    Pulmonary exam normal breath sounds clear to auscultation       Cardiovascular hypertension, Pt. on medications and Pt. on home beta blockers  Rhythm:Regular Rate:Normal     Neuro/Psych Anxiety Depression negative neurological ROS     GI/Hepatic Neg liver ROS, GERD  Medicated and Controlled,  Endo/Other  negative endocrine ROS  Renal/GU negative Renal ROS  negative genitourinary   Musculoskeletal   Abdominal   Peds  Hematology  (+) Blood dyscrasia, anemia ,   Anesthesia Other Findings   Reproductive/Obstetrics negative OB ROS                            Anesthesia Physical Anesthesia Plan  ASA: III  Anesthesia Plan: MAC   Post-op Pain Management:    Induction: Intravenous  PONV Risk Score and Plan: 2 and Propofol infusion and Ondansetron  Airway Management Planned: Nasal Cannula  Additional Equipment:   Intra-op Plan:   Post-operative Plan:   Informed Consent: I have reviewed the patients History and Physical, chart, labs and discussed the procedure including the risks, benefits and alternatives for the proposed anesthesia with the patient or authorized representative who has indicated his/her understanding and acceptance.     Dental advisory given  Plan Discussed with: CRNA  Anesthesia Plan Comments:         Anesthesia Quick Evaluation

## 2019-02-21 NOTE — Interval H&P Note (Signed)
History and Physical Interval Note:  02/21/2019 9:12 AM  [name]  has presented today for surgery, with the diagnosis of hematochezia.  The various methods of treatment have been discussed with the patient and family. After consideration of risks, benefits and other options for treatment, the patient has consented to  Procedure(s): COLONOSCOPY WITH PROPOFOL (N/A) as a surgical intervention.  The patient's history has been reviewed, patient examined, no change in status, stable for surgery.  I have reviewed the patient's chart and labs.  Questions were answered to the patient's satisfaction.     Scarlette Shorts

## 2019-02-23 ENCOUNTER — Encounter (HOSPITAL_COMMUNITY): Payer: Self-pay | Admitting: Internal Medicine

## 2019-02-24 ENCOUNTER — Other Ambulatory Visit: Payer: Self-pay

## 2019-02-24 ENCOUNTER — Encounter: Payer: Self-pay | Admitting: Family Medicine

## 2019-02-24 ENCOUNTER — Ambulatory Visit (INDEPENDENT_AMBULATORY_CARE_PROVIDER_SITE_OTHER): Payer: Self-pay | Admitting: Internal Medicine

## 2019-02-24 DIAGNOSIS — J9612 Chronic respiratory failure with hypercapnia: Secondary | ICD-10-CM

## 2019-02-24 DIAGNOSIS — J9611 Chronic respiratory failure with hypoxia: Secondary | ICD-10-CM

## 2019-02-24 DIAGNOSIS — D869 Sarcoidosis, unspecified: Secondary | ICD-10-CM

## 2019-02-24 DIAGNOSIS — J449 Chronic obstructive pulmonary disease, unspecified: Secondary | ICD-10-CM

## 2019-02-24 NOTE — Progress Notes (Signed)
Subjective:    Patient ID: [name], female   DOB: [birth year]    MRN: [id number]    Brief patient profile:  23 yobf quit smoking 10/2004 dx by transbronchial biopsy 5/92 with NCG  consistent with sarcoid. Since that time she's been off and on prednisone multiple  times with flares of coughing dyspnea and skin involvement > weaned off chronic prednisone Feb 2012 and placed back on 10/10/16 for deteriorating obstructive changes on pfts  With resp failure/ 02 dep        History of Present Illness  02/20/2017  f/u ov/Obera Stauch re:   GOLD III/ pred 10 mg daily and bevespi 2bid  and alb rare  Chief Complaint  Patient presents with  . Follow-up    6wk rov. pt states breathing is baseline. pt reports of sob with exertion & occ prod cough with green mucus mainly in the morning   Doe better :  [facility] = can't walk a nl pace on a flat grade s sob but does fine slow and flat eg shopping on  2lpm / ok at rest s 02  Uses 02 2lpm sleeping  rec Drop prednisone to 10 mg one half daily      NP eval 05/14/17  We will decrease your prednisone to 2.5 mg daily. We will prescribe 5 mg tablets. We will renew your prescription for Symbicort.  2 puffs twice daily. Rinse mouth after use. Follow up appointment with Dr. Melvyn Novas in 2 months. We will refer you to Pulmonary rehab > started Aug 2018  Referral to a dietician for weight loss >  at rehab     Date of Admission: 09/07/2017                    Date of Discharge: 09/09/17    Discharge Diagnoses/Problem List:  COPD GOLD stage IV HTN Anemia HLD Breast cancer s/p resection Sarcoidosis GERD      11/26/2017  f/u ov/Shalan Neault re:  Sarcoid/skin involvement chronic cough ? Sinus dz/ using med calendar well / pednisone 10 mg daily  Chief Complaint  Patient presents with  . Follow-up    Cough has improved some, but still producing some greenish yellow sputum first thing in the am.  She has not had to use her albuterol inhaler. She c/o shakiness in her  hands for the past couple of months.    less cough p augmentin but still am sputum green / no noct symptoms Doing well with rehab but sometimes has to turn 02 up to 4 lpm there but doesn't do so in other settings and concerned about wt gain  rec Start plaquenil 200 mg daily  Ok to adjust the prednisone 35m  from 1-2 pills in am as per med calendar See calendar for specific medication instructions    01/10/2018  f/u ov/Kriti Katayama re:  Sarcoid/ plaquenil 200 mg daily plus pred 5 mg daily / last time off was one year prior/ 02 = 2lpm floor and 4lpm with ex Chief Complaint  Patient presents with  . Follow-up    repeat LFT's today. She states she has noticed some shaking in her hands for a while now and forgot to mention this at last ov. Her breathing is doing well and she is down to 5 mg pred.   Dyspnea:  Ex bike x 15-30 min / low resistance and 02 up to 3-4lpm sats in mid 90s Cough: no Sleep: on 2lpm / one pillow  SABA use:  Not  needing  rec Try prednisone 5 mg  Reduce to one half daily to see if not doing as well and if not increase prednisone to 5 mg and call me for higher dose of plaquenil  Late add: with esr high and lfts ok rec bid plaquenil and return in one month for pfts/lfts> could not tol 400 mg daily due to n and v but ok on 200 mg daily      05/21/2018  f/u ov/Kaydee Magel re:  codp GOLD IV/ 02 dep on pred 5 mg x 2  daily for sarcoid / intol of plaquenil > 200 mg (N/V) Using med cal well  Chief Complaint  Patient presents with  . Follow-up    Breathing unchanged since the last visit. She has prod cough in the am's with min green sputum. She is using her albuterol inhaler 1 x per wk on average.   Dyspnea:  On 3-4 lpm does [employer] / also able to do ex bike x 15 min  Cough: just in am , slt discolored mucus  SABA use: rare, usually if goes out in hear  02: 2lpm hs and 3-4 lpm daytime   rec Please see patient coordinator before you leave today  to schedule referral to dermatology  No change  in medications except use prednisone 5 mg  X 2 when breathing/ coughing worse then 1 daily thereafter Follow the medication calendar we reviewed today    11/22/2018  f/u ov/Senora Lacson re:  Sarcooid/ copd/ 02 dep resp failure/ indolent onset x 3  weeks no appetite fatigue  Increased sob on pred 5 mg daily / no med calendar  Chief Complaint  Patient presents with  . Follow-up    Pt c/o increased SOB and cough the past few days. Her cough is prod at times with yellow to clear sputum. She states her mouth is very dry in the mornings. Her appetite is poor and she is down 9 lbs since the last visit. She is using her albuterol inhaler 2 x per wk on average.    Dyspnea:  mb and back flat ok on 3lpm Pulsed when walks  Cough: none Sleeping: on side bed is horizontal SABA use: couple of times a week 02: 2lpm and up to 3lpm walk rec Please remember to go to the lab department   for your tests - we will call you with the results when they are available. Take 20 prednisone daily until better then 10 mg per day and w/in 3 days got worse more /sob/ decreased sats so admit 12/17/18   Admit date: 12/17/2018 Discharge date: 12/22/2018    DISCHARGE DIAGNOSES:  Multifocal pneumonia likely community-acquired Acute COPD exacerbation, improved Acute on chronic respiratory failure with hypoxia History of sarcoidosis on chronic steroids Acute kidney injury, resolved Essential hypertension History of breast cancer   12/30/2018  f/u ov/Sohail Capraro re: post hosp eval / sarcoid on plaquenil and floor of pred 5 mg daily/ 02 2lpm - 3lpm with activity / copd on stiolto Chief Complaint  Patient presents with  . Hospitalization Follow-up    Breathing has improved almost back to her normal baseline. She is coughing with minimal clear sputum. She has been using her albuterol inhaler about once per wk and she has used neb x 3 since hospital d/c.   Dyspnea:  MMRC3 = can't walk 100 yards even at a slow pace at a flat grade s stopping  due to sob   Cough: minimal clear  Sleeping: bed is flat/ props  up on 2 pillows SABA use: as above 02:  2lpm 24/7  rec Prednisone 10 mg 2 daily until better, then 1daily   Phone care 01/27/19  rx doxy   Virtual Visit via Telephone Note 02/24/2019   I connected with [name] on 02/24/19 at  2:15 PM EDT by telephone and verified that I am speaking with the correct person using two identifiers.   I discussed the limitations, risks, security and privacy concerns of performing an evaluation and management service by telephone and the availability of in person appointments. I also discussed with the patient that there may be a patient responsible charge related to this service. The patient expressed understanding and agreed to proceed.   History of Present Illness: sarcoid on plaquenil and floor of pred 5 mg daily/ 02 2lpm - 3lpm with activity / copd on stiolto Dyspnea:  MMRC3 = can't walk 100 yards even at a slow pace at a flat grade s stopping due to sob even on 3lpm  Cough: minimal p supper not noct Sleeping: on side / bed is flat  SABA use: rarely  02: as above   No obvious day to day or daytime variability or assoc excess/ purulent sputum or mucus plugs or hemoptysis or cp or chest tightness, subjective wheeze or overt sinus or hb symptoms.    Also denies any obvious fluctuation of symptoms with weather or environmental changes or other aggravating or alleviating factors except as outlined above.   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 Unice Cobble  -h/o Daily prednisone since 2007   > off completely Feb 2012 with no problem> restarted  09/2016  ? of SUPERFICIAL VEIN THROMBOSIS (ICD-453.9)   ENDOMETRIAL POLYP (ICD-621.0)   UTERINE FIBROID  (ICD-218.9)  RHINITIS, ALLERGIC (ICD-477.9)  HYPERTENSION, BENIGN SYSTEMIC (ICD-401.1)  COPD (ICD-496)  - PFT's 06/26/08 FEV1 1.09 ratio  - PFTs 03/02/10 FEV1 1.06 ratio 34  - PFT's 08/01/2011 FEV1 1.6 (42%)  41 ratio  DLCO 55 corrects 77 - HFA  50% 06/05/2011  > 75% 06/29/2011  - Spiriva trial March 02, 2010 > ? Some better but no worse off 04/2011 - Rehab started mid aug 2018  BACK PAIN, LOW (ICD-724.2)  ANEMIA, IRON DEFICIENCY, UNSPEC. (ICD-280.9)  Health Maintenance.......................................................   [facility]   Family History:  crohn`s in daughter, M-lupus, htn, asthma,  no Ca, DM, CAD    Social History:   lives with twin children and grandson. single; >20 pack year history, quit in 2005; no EtOH  Laid off  from works for [employer] of the [place] Sept 2013        Observations/Objective: Phonation is fine / no air hunger apparent    Assessment and Plan: See problem list for active a/p's   Follow Up Instructions: See avs for instructions unique to this ov which includes revised/ updated med list     I discussed the assessment and treatment plan with the patient. The patient was provided an opportunity to ask questions and all were answered. The patient agreed with the plan and demonstrated an understanding of the instructions.   The patient was advised to call back or seek an in-person evaluation if the symptoms worsen or if the condition fails to improve as anticipated.  I provided 25  minutes of non-face-to-face time during this encounter.   Christinia Gully, MD

## 2019-02-24 NOTE — Patient Instructions (Signed)
Try taper prednisone to 10 mg daily alternating with 5 mg daily x 2 weeks and if do just as well on the lower dose then continue the 5 mg daily but if worse go back to 20 mg daily and start the taper over.    Please schedule a follow up visit in 3 months but call sooner if needed

## 2019-02-25 ENCOUNTER — Encounter: Payer: Self-pay | Admitting: Internal Medicine

## 2019-02-25 ENCOUNTER — Telehealth: Payer: Self-pay | Admitting: Internal Medicine

## 2019-02-25 NOTE — Telephone Encounter (Signed)
Medication name and strength: Plaquenil 200mg  Provider: MW [facility]acy: [facility] [address] Patient insurance ID: [id number] Phone: [phone] Fax:   Was the PA started on CMM?  Yes If yes, please enter the Key: [id number] Timeframe for approval/denial: Medication was approved instantly   Pharmacy is aware of approval. Will close this encounter.

## 2019-02-25 NOTE — Assessment & Plan Note (Signed)
New start 09/2016 at admit see records - 10/18/16 Patient Saturations on Room Air at Rest = 92% Patient Saturations on Room Air while Ambulating = 84% Patient Saturations on 2 Liters of oxygen while Ambulating = 90% - HCO3  11/09/2016 = 39  - HCO3   03/16/17        = 34  - HC03    01/10/2018  = 40  - HC03  11/22/2018     = 38  - HC03  12/20/2018       = 26   As of  02/24/2019  rec 2lpm sleeping and titrate with ambulation to maintain > 90%    Adequate control on present rx, reviewed in detail with pt > no change in rx needed     Each maintenance medication was reviewed in detail including most importantly the difference between maintenance and as needed and under what circumstances the prns are to be used.  Please see AVS for specific  Instructions which are unique to this visit and I personally typed out  which were reviewed in detail in writing with the patient and a copy provided.

## 2019-02-25 NOTE — Assessment & Plan Note (Signed)
Quit smoking 2005  - PFT's 06/26/08 FEV1 1.09 ratio  - PFTs 03/02/10 FEV1 1.06 ratio 34    PFT's 08/01/2011 FEV1 1.6 (42%)  41 ratio  DLCO 55 corrects 77 - Spiriva trial March 02, 2010 > ? better but not worse off it 04/2011 - 05/12/2015 p extensive coaching HFA effectiveness =    90%  - Referred to Rehab 08/21/2014 > could not arrange due to schedule  -med calendar 06/16/2013 > did not bring to office as requested 12/19/13 or 04/23/14  Or 08/21/2014  - 11/09/2016    changed symb to bevespi since taking chronic pred for pf    - 02/20/2017 decreased pred to 5 mg daily > weaned to 2.5 mg qd as of 07/17/2017  - started rehab mid Aug 2018  - 07/17/2017 changed to pred 2.5 even days > did not tolerate so changed  Around  07/28/17 back to 5 mg qod  - Spirometry 09/03/2017  FEV1 0.52 (23%)  Ratio 40 with typical curvature   - PFT's  02/19/2018  FEV1 0.65 (29 % ) ratio 43  p 1 % improvement from saba p stiolto/ pred 5 mg  prior to study with DLCO  39 % corrects to 56  % for alv volume - 12/30/2018  After extensive coaching inhaler device,  effectiveness =    95%   With smi > continue stiolto    Adequate control on present rx, reviewed in detail with pt > no change in rx needed  = Pt is Group B in terms of symptom/risk and laba/lama therefore appropriate rx at this point >>>  stiolto 2 puffs each am

## 2019-02-25 NOTE — Assessment & Plan Note (Signed)
Positive transbronchial biopsy 02/19/91 by Dr. Unice Cobble  -Daily prednisone since 2007 with ? adrenal insufficiency iatrogenic > off completely Feb 2012 with no recurrence - PFT's 06/26/08 FEV1  1.09 ratio  - PFTs  03/02/10 FEV1  1.06 (42%) ratio 34 and no better p B2,  DLC0 48% corrects to 64% - PFT's 08/01/2011 FEV1 1.6 (42%)  41 ratio  DLCO 55 corrects 77 - PFTs 10/10/16    FEV1  0.87(38)ratio 41 and DLCO 36/39c corrects to 51 for alv vol > placed back on daily pred  - 02/20/2017 decreased pred to 5 mg daily > to 2.24m daily as of 07/17/2017  - 07/17/2017 try 2.5 mg qod > 09/28/2017 changed to 20 mg until better and new floor of 5 mg daily plus consider plaquenil due to new skin involvement on face  - opth eval neg 11/10/17 - Start plaquenil 200 mg daily 11/26/2017  - 01/10/2018 ESR up to 97 with pred taper so rec bid plaquenil > could only tol 200 mg daily  - 02/19/2018 ESR = 94 on 536mdaily  - 05/21/2018 referred to dermatology  - 08/21/2018   ESR = 80  On prednisone 10 mg/ plaquenil 200 mg daily > tapered to floor of 5 mg daily  - 11/22/2018  ESR  = 113 on pred 5 mg and plaq 200 mg with calcium 12.1 so rec pred 20 mg ub then new floor of 10 mg daily   - 02/24/2019 clinically improved and doing fine on 10 mg daily so rec rechalleng with slow taper to 5 mg daily    The goal with a chronic steroid dependent illness is always arriving at the lowest effective dose that controls the disease/symptoms and not accepting a set "formula" which is based on statistics or guidelines that don't always take into account patient  variability or the natural hx of the dz in every individual patient, which may well vary over time.  For now therefore I recommend the patient maintain  20 mg ceiling and 5 mg floor

## 2019-03-04 ENCOUNTER — Telehealth: Payer: Self-pay | Admitting: Internal Medicine

## 2019-03-04 MED ORDER — AZITHROMYCIN 250 MG PO TABS: ORAL_TABLET | ORAL | 0 refills | Status: DC

## 2019-03-04 NOTE — Telephone Encounter (Signed)
So per my last instructions should already be back to 20 mg per day as breathing is worse with taper  For any fever, discolored mucus ok to order zpak

## 2019-03-04 NOTE — Telephone Encounter (Signed)
Called and spoke with pt in regards to the info stated by MW. Pt stated that she has had some discolored mucus and that was why she was requesting an abx to be called in. I stated to pt that we would send in zpak abx per MW. Pt expressed understanding. Verified pt's preferred pharmacy and sent abx in for pt. Nothing further needed.

## 2019-03-04 NOTE — Telephone Encounter (Signed)
Pt had tele-visit on 4/13:sarcoidosis Pt c/o congestion, slight wheezing and cough with yellow mucus. Pt thinks she made need another abx. She is currently alternating prednisone 10 m with 5 mg daily.  KFM:MCRF Try taper prednisone to 10 mg daily alternating with 5 mg daily x 2 weeks and if do just as well on the lower dose then continue the 5 mg daily but if worse go back to 20 mg daily and start the taper over.    Please schedule a follow up visit in 3 months but call sooner if needed

## 2019-03-25 ENCOUNTER — Other Ambulatory Visit: Payer: Self-pay | Admitting: Internal Medicine

## 2019-03-28 ENCOUNTER — Other Ambulatory Visit: Payer: Self-pay

## 2019-03-28 ENCOUNTER — Ambulatory Visit (INDEPENDENT_AMBULATORY_CARE_PROVIDER_SITE_OTHER): Payer: Self-pay | Admitting: Family Medicine

## 2019-03-28 VITALS — BP 122/78 | HR 88

## 2019-03-28 DIAGNOSIS — M545 Low back pain, unspecified: Secondary | ICD-10-CM | POA: Insufficient documentation

## 2019-03-28 MED ORDER — MELOXICAM 15 MG PO TABS: 15.0000 mg | ORAL_TABLET | Freq: Every day | ORAL | 0 refills | Status: AC

## 2019-03-28 NOTE — Assessment & Plan Note (Signed)
Patient presents with acute low back pain on the right side for the past few days.  No recent trauma or injury no history of low back pain.  No sciatica associated with back pain.  Recent CT while hospitalized did not show any osseous abnormalities.  Suspect low back pain is mostly muscular in nature possibly strain.  Patient has been pretty inactive for the past 3 months with COVID confinement.  Patient denies any knee pain or hip pain.  Low suspicion for osteoarthritis.  Will prescribe NSAIDs.  Will hold off on muscle relaxer and reassess in a few days.  Handout given for home exercises for low back pain.  Could consider imaging physical therapy if no improvement is noted. --Meloxicam 15 mg daily for 7 days --Follow-up as needed

## 2019-03-28 NOTE — Progress Notes (Signed)
Subjective:    Patient ID: [name], female    DOB: 12-20-1960, 58 y.o.   MRN: [id number]   CC: Low back pain  HPI: Patient is a 58 year old female who presents today complaining of right-sided low back pain.  Patient reports that pain has been traveling down to her knee.  Patient denies any recent trauma fall or injury.  No history of back pain.  Patient has not taken any medication for it.  Patient reports that she has been staying home for the past few weeks and has been inactive.  Patient denies any spasming.  Patient denies any fevers or chills.  Smoking status reviewed   ROS: all other systems were reviewed and are negative other than in the HPI   Past Medical History:  Diagnosis Date  . Acute on chronic respiratory failure ([place]) 10/09/2016  . Allergic rhinitis   . Breast cancer ([place]) 2016   right breast  . COPD, mild ([place]) FOLLOWED BY DR Melvyn Novas  . GERD (gastroesophageal reflux disease)   . HTN (hypertension)   . Hypertension   . Iron deficiency anemia   . Long-term current use of steroids SYMBICORT INHALER  . 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 Overweight (BMI 25.0-29.9) 05/11/2016  . Personal history of radiation therapy 2016  . Sarcoidosis STABLE PER CXR JUNE 2013  . Shortness of breath   . Sickle cell trait [facility])     Past Surgical History:  Procedure Laterality Date  . ADJUSTABLE SUTURE MANIPULATION  05/22/2012   Procedure: ADJUSTABLE SUTURE MANIPULATION;  Surgeon: Dara Hoyer, MD;  Location: [facility];  Service: Ophthalmology;  Laterality: Right;  . BREAST LUMPECTOMY Right 2016  . CESAREAN SECTION  1986   W/ BILATERAL TUBAL LIGATION  . COLONOSCOPY WITH PROPOFOL N/A 02/21/2019   Procedure: COLONOSCOPY WITH PROPOFOL;  Surgeon: Irene Shipper, MD;  Location: [facility];  Service: Endoscopy;  Laterality: N/A;  . EYE SURGERY    . MEDIAN RECTUS REPAIR  05/22/2012   Procedure: MEDIAN RECTUS REPAIR;  Surgeon: Dara Hoyer, MD;  Location[facility];  Service: Ophthalmology;  Laterality: Bilateral;  INFERIOR RECTUS RESECTION WITH ADJUSTIBLE SUTURES RIGHT EYE   . POLYPECTOMY  02/21/2019   Procedure: POLYPECTOMY;  Surgeon: Irene Shipper, MD;  Location: [facility];  Service: Endoscopy;;  . RADIOACTIVE SEED GUIDED PARTIAL MASTECTOMY WITH AXILLARY SENTINEL LYMPH NODE BIOPSY Right 08/18/2015   Procedure: RADIOACTIVE SEED GUIDED PARTIAL MASTECTOMY WITH AXILLARY SENTINEL LYMPH NODE BIOPSY;  Surgeon: Stark Klein, MD;  Location: [facility];  Service: General;  Laterality: Right;  . TOTAL ROBOTIC ASSISTED LAPAROSCOPIC HYSTERECTOMY  12-30-2010   SYMPTOMATIC UTERINE FIBROIDS  . UPPER TEETH EXTRACTION'S  1992    Past medical history, surgical, family, and social history reviewed and updated in the EMR as appropriate.  Objective:  BP 122/78   Pulse 88   SpO2 93% Comment: on 2 L of oxygen  Vitals and nursing note reviewed  General: NAD, pleasant, able to participate in exam Cardiac: RRR, normal heart sounds, no murmurs. 2+ radial and PT pulses bilaterally Respiratory: CTAB, normal effort, No wheezes, rales or rhonchi Abdomen: soft, nontender, nondistended, no hepatic or splenomegaly, +BS Low back exam: No lumbar lordosis, thoracic kyphosis, scoliosis, pelvic assymmetry/tilt. TTP right lumbar region. Full lumbar flexion/extension, strength Hip (adduction, flexion), unable to perform straight leg raise due to pain and body habitus, FABER, patellar and achilles reflex intact.  Extremities: no edema or cyanosis. WWP. Skin:  warm and dry, no rashes noted Neuro: alert and oriented x4, no focal deficits Psych: Normal affect and mood   Assessment & Plan:   Acute right-sided low back pain without sciatica Patient presents with acute low back pain on the right side for the past few days.  No recent trauma or injury no history of low back pain.  No sciatica associated with back pain.  Recent CT while hospitalized did not  show any osseous abnormalities.  Suspect low back pain is mostly muscular in nature possibly strain.  Patient has been pretty inactive for the past 3 months with COVID confinement.  Patient denies any knee pain or hip pain.  Low suspicion for osteoarthritis.  Will prescribe NSAIDs.  Will hold off on muscle relaxer and reassess in a few days.  Handout given for home exercises for low back pain.  Could consider imaging physical therapy if no improvement is noted. --Meloxicam 15 mg daily for 7 days --Follow-up as needed    Marjie Skiff, MD [place] PGY-3

## 2019-03-28 NOTE — Patient Instructions (Addendum)
It was great seeing you today! We have addressed the following issues today  1. I will prescribe Meloxicam you will take it once a day for a week. Make sure you take it with food. 2. I will also check you vitamin D level and will follow up on the results. 3. Make sure you stay active as discussed. Not moving will make your back pain worse.  If we did any lab work today, and the results require attention, either me or my nurse will get in touch with you. If everything is normal, you will get a letter in mail and a message via . If you don't hear from [place] in two weeks, please give [place] a call. Otherwise, we look forward to seeing you again at your next visit. If you have any questions or concerns before then, please call the clinic at [phone].  Please bring all your medications to every doctors visit  Sign up for My Chart to have easy access to your labs results, and communication with your Primary care physician. Please ask Front Desk for some assistance.   Please check-out at the front desk before leaving the clinic.    Take Care,   Dr. Andy Gauss   Back Exercises If you have pain in your back, do these exercises 2-3 times each day or as told by your doctor. When the pain goes away, do the exercises once each day, but repeat the steps more times for each exercise (do more repetitions). If you do not have pain in your back, do these exercises once each day or as told by your doctor. Exercises Single Knee to Chest Do these steps 3-5 times in a row for each leg: 1. Lie on your back on a firm bed or the floor with your legs stretched out. 2. Bring one knee to your chest. 3. Hold your knee to your chest by grabbing your knee or thigh. 4. Pull on your knee until you feel a gentle stretch in your lower back. 5. Keep doing the stretch for 10-30 seconds. 6. Slowly let go of your leg and straighten it. Pelvic Tilt Do these steps 5-10 times in a row: 1. Lie on your back on a firm bed or the  floor with your legs stretched out. 2. Bend your knees so they point up to the ceiling. Your feet should be flat on the floor. 3. Tighten your lower belly (abdomen) muscles to press your lower back against the floor. This will make your tailbone point up to the ceiling instead of pointing down to your feet or the floor. 4. Stay in this position for 5-10 seconds while you gently tighten your muscles and breathe evenly. Cat-Cow Do these steps until your lower back bends more easily: 1. Get on your hands and knees on a firm surface. Keep your hands under your shoulders, and keep your knees under your hips. You may put padding under your knees. 2. Let your head hang down, and make your tailbone point down to the floor so your lower back is round like the back of a cat. 3. Stay in this position for 5 seconds. 4. Slowly lift your head and make your tailbone point up to the ceiling so your back hangs low (sags) like the back of a cow. 5. Stay in this position for 5 seconds.  Press-Ups Do these steps 5-10 times in a row: 1. Lie on your belly (face-down) on the floor. 2. Place your hands near your head, about  shoulder-width apart. 3. While you keep your back relaxed and keep your hips on the floor, slowly straighten your arms to raise the top half of your body and lift your shoulders. Do not use your back muscles. To make yourself more comfortable, you may change where you place your hands. 4. Stay in this position for 5 seconds. 5. Slowly return to lying flat on the floor.  Bridges Do these steps 10 times in a row: 1. Lie on your back on a firm surface. 2. Bend your knees so they point up to the ceiling. Your feet should be flat on the floor. 3. Tighten your butt muscles and lift your butt off of the floor until your waist is almost as high as your knees. If you do not feel the muscles working in your butt and the back of your thighs, slide your feet 1-2 inches farther away from your butt. 4. Stay  in this position for 3-5 seconds. 5. Slowly lower your butt to the floor, and let your butt muscles relax. If this exercise is too easy, try doing it with your arms crossed over your chest. Belly Crunches Do these steps 5-10 times in a row: 4. Lie on your back on a firm bed or the floor with your legs stretched out. 5. Bend your knees so they point up to the ceiling. Your feet should be flat on the floor. 6. Cross your arms over your chest. 7. Tip your chin a little bit toward your chest but do not bend your neck. 8. Tighten your belly muscles and slowly raise your chest just enough to lift your shoulder blades a tiny bit off of the floor. 9. Slowly lower your chest and your head to the floor. Back Lifts Do these steps 5-10 times in a row: 1. Lie on your belly (face-down) with your arms at your sides, and rest your forehead on the floor. 2. Tighten the muscles in your legs and your butt. 3. Slowly lift your chest off of the floor while you keep your hips on the floor. Keep the back of your head in line with the curve in your back. Look at the floor while you do this. 4. Stay in this position for 3-5 seconds. 5. Slowly lower your chest and your face to the floor. Contact a doctor if:  Your back pain gets a lot worse when you do an exercise.  Your back pain does not lessen 2 hours after you exercise. If you have any of these problems, stop doing the exercises. Do not do them again unless your doctor says it is okay. Get help right away if:  You have sudden, very bad back pain. If this happens, stop doing the exercises. Do not do them again unless your doctor says it is okay. This information is not intended to replace advice given to you by your health care provider. Make sure you discuss any questions you have with your health care provider. Document Released: 12/02/2010 Document Revised: 07/24/2018 Document Reviewed: 12/24/2014 Elsevier Interactive Patient Education  [employer].

## 2019-03-29 LAB — VITAMIN D 25 HYDROXY (VIT D DEFICIENCY, FRACTURES): Vit D, 25-Hydroxy: 32.9 ng/mL (ref 30.0–100.0)

## 2019-04-15 ENCOUNTER — Other Ambulatory Visit: Payer: Self-pay | Admitting: Hematology and Oncology

## 2019-04-16 ENCOUNTER — Telehealth: Payer: Self-pay | Admitting: Internal Medicine

## 2019-04-16 ENCOUNTER — Telehealth (INDEPENDENT_AMBULATORY_CARE_PROVIDER_SITE_OTHER): Payer: BLUE CROSS/BLUE SHIELD | Admitting: Nurse Practitioner

## 2019-04-16 ENCOUNTER — Encounter: Payer: Self-pay | Admitting: Nurse Practitioner

## 2019-04-16 DIAGNOSIS — D869 Sarcoidosis, unspecified: Secondary | ICD-10-CM

## 2019-04-16 NOTE — Telephone Encounter (Signed)
If the abx didn't help should follow instrutions  "if worse go back to 20 mg daily" and if not better p a week of this dose then ov with all meds in hand to regroup

## 2019-04-16 NOTE — Assessment & Plan Note (Signed)
Patient has a tele-visit today for an acute visit.  She complains today of cough that is productive of green sputum.  Recently finished a round of antibiotics.  She was seen by Dr. Melvyn Novas through tele-visit on 02/24/2019 and was advised to try to taper prednisone to 10 mg daily alternating with 5 mg daily x 2 weeks and if do just as well on the lower dose then continue the 5 mg daily but if worse go back to 20 mg daily and start the taper over.   Patient has only been taking 5 mg of prednisone daily.  Patient recently completed 2 rounds of antibiotics.  She states that these did not help.  She states that her cough has been continuous since her hospitalization for pneumonia in February 2020.  Patient states that she has started a walking routine and walks daily.  Patient also wanted [place] to do that she will need an upcoming [place] walk for O2 per Medicare.    Patient Instructions  Increase prednisone back to 20 mg daily Continue Proventil as needed Continue Stiolto  Follow up: Follow-up with Dr. Melvyn Novas in 1 week and bring all medications to appointment.

## 2019-04-16 NOTE — Telephone Encounter (Signed)
Called and spoke with Patient. Patient stated she is still having a productive cough, with green phlegm.  Patient stated she has been on 2 antibiotics and competed both as directed.  Patient stated she has mucinex, but has not been taking it. Patient denies any OTC medications.  Patient denies any other symptoms.   Patient stated she wanted Dr Melvyn Novas to be aware of her recently receiving Medicare, and she is needing a qualifying walk to continue her home oxygen, but will need her productive cough cleared up first.   Last OV with MW, 02/24/19   Instructions   Try taper prednisone to 10 mg daily alternating with 5 mg daily x 2 weeks and if do just as well on the lower dose then continue the 5 mg daily but if worse go back to 20 mg daily and start the taper over.    Please schedule a follow up visit in 3 months but call sooner if needed        Message routed to Dr Melvyn Novas

## 2019-04-16 NOTE — Telephone Encounter (Signed)
Patient scheduled my chart virtual visit, with Tonya, NP 04/16/19, at 3:30pm.  Nothing further at this time.

## 2019-04-16 NOTE — Progress Notes (Signed)
Virtual Visit via Video Note  I connected with [name] on 04/16/19 at  3:30 PM EDT by a video enabled telemedicine application and verified that I am speaking with the correct person using two identifiers.  Location: Patient: home Provider: office   I discussed the limitations of evaluation and management by telemedicine and the availability of in person appointments. The patient expressed understanding and agreed to proceed.  History of Present Illness: 89 yobf quit smoking 10/2004 dx by transbronchial biopsy 5/92 with NCG  consistent with sarcoid. Since that time she's been off and on prednisone multiple  times with flares of coughing dyspnea and skin involvement > weaned off chronic prednisone Feb 2012 and placed back on 10/10/16 for deteriorating obstructive changes on pfts  With resp failure/ 02 dep she was seen by Dr. Laurance Flatten on 02/24/2019 through tele-visit and was advised to  Patient has a tele-visit today for an acute visit.  She complains today of cough that is productive of green sputum.  Recently finished a round of antibiotics.  She was seen by Dr. Melvyn Novas through tele-visit on 02/24/2019 and was advised to try to taper prednisone to 10 mg daily alternating with 5 mg daily x 2 weeks and if do just as well on the lower dose then continue the 5 mg daily but if worse go back to 20 mg daily and start the taper over.   Patient has only been taking 5 mg of prednisone daily.  Patient recently completed 2 rounds of antibiotics.  She states that these did not help.  She states that her cough has been continuous since her hospitalization for pneumonia in February 2020.  Patient states that she has started a walking routine and walks daily.  Patient also wanted [place] to do that she will need an upcoming [place] walk for O2 per Medicare.  Patient denies any recent fever. Denies f/c/s, n/v/d, hemoptysis, PND, leg swelling.    Observations/Objective: Positive transbronchial biopsy 02/19/91 by Dr.  Unice Cobble  -Daily prednisone since 2007 with ? adrenal insufficiency iatrogenic > off completely Feb 2012 with no recurrence - PFT's 06/26/08 FEV1  1.09 ratio  - PFTs  03/02/10 FEV1  1.06 (42%) ratio 34 and no better p B2,  DLC0 48% corrects to 64% - PFT's 08/01/2011 FEV1 1.6 (42%)  41 ratio  DLCO 55 corrects 77 - PFTs 10/10/16    FEV1  0.87(38)ratio 41 and DLCO 36/39c corrects to 51 for alv vol > placed back on daily pred  - 02/20/2017 decreased pred to 5 mg daily > to 2.76m daily as of 07/17/2017  - 07/17/2017 try 2.5 mg qod > 09/28/2017 changed to 20 mg until better and new floor of 5 mg daily plus consider plaquenil due to new skin involvement on face  - opth eval neg 11/10/17 - Start plaquenil 200 mg daily 11/26/2017  - 01/10/2018 ESR up to 97 with pred taper so rec bid plaquenil > could only tol 200 mg daily  - 02/19/2018 ESR = 94 on 542mdaily  - 05/21/2018 referred to dermatology  - 08/21/2018   ESR = 80  On prednisone 10 mg/ plaquenil 200 mg daily > tapered to floor of 5 mg daily  - 11/22/2018  ESR  = 113 on pred 5 mg and plaq 200 mg with calcium 12.1 so rec pred 20 mg ub then new floor of 10 mg daily   - 02/24/2019 clinically improved and doing fine on 10 mg daily so rec rechalleng with  slow taper to 5 mg daily   Last OV Dr. Melvyn Novas 02/24/19: Try taper prednisone to 10 mg daily alternating with 5 mg daily x 2 weeks and if do just as well on the lower dose then continue the 5 mg daily but if worse go back to 20 mg daily and start the taper over.   Assessment and Plan: Patient has a tele-visit today for an acute visit.  She complains today of cough that is productive of green sputum.  Recently finished a round of antibiotics.  She was seen by Dr. Melvyn Novas through tele-visit on 02/24/2019 and was advised to try to taper prednisone to 10 mg daily alternating with 5 mg daily x 2 weeks and if do just as well on the lower dose then continue the 5 mg daily but if worse go back to 20 mg daily and start the taper  over.   Patient has only been taking 5 mg of prednisone daily.  Patient recently completed 2 rounds of antibiotics.  She states that these did not help.  She states that her cough has been continuous since her hospitalization for pneumonia in February 2020.  Patient states that she has started a walking routine and walks daily.  Patient also wanted [place] to do that she will need an upcoming [place] walk for O2 per Medicare.    Patient Instructions  Increase prednisone back to 20 mg daily Continue Proventil as needed Continue Stiolto    Follow Up Instructions: Follow-up with Dr. Melvyn Novas only not NP in 1 week and bring all medications to appointment.    I discussed the assessment and treatment plan with the patient. The patient was provided an opportunity to ask questions and all were answered. The patient agreed with the plan and demonstrated an understanding of the instructions.   The patient was advised to call back or seek an in-person evaluation if the symptoms worsen or if the condition fails to improve as anticipated.  I provided 22 minutes of non-face-to-face time during this encounter.   Fenton Foy, NP

## 2019-04-16 NOTE — Patient Instructions (Addendum)
Increase prednisone back to 20 mg daily Continue Proventil as needed Continue Stiolto  Follow up: Follow-up with Dr. Melvyn Novas in 1 week and bring all medications to appointment.

## 2019-04-23 NOTE — Progress Notes (Signed)
Chart and office note reviewed in detail  > agree with a/p as outlined    

## 2019-05-05 ENCOUNTER — Encounter: Payer: Self-pay | Admitting: Student in an Organized Health Care Education/Training Program

## 2019-05-05 ENCOUNTER — Other Ambulatory Visit: Payer: Self-pay

## 2019-05-05 ENCOUNTER — Ambulatory Visit (INDEPENDENT_AMBULATORY_CARE_PROVIDER_SITE_OTHER): Payer: Medicare Other | Admitting: Student in an Organized Health Care Education/Training Program

## 2019-05-05 VITALS — BP 138/74 | HR 72 | Wt 213.2 lb

## 2019-05-05 DIAGNOSIS — G8929 Other chronic pain: Secondary | ICD-10-CM | POA: Diagnosis not present

## 2019-05-05 DIAGNOSIS — K219 Gastro-esophageal reflux disease without esophagitis: Secondary | ICD-10-CM | POA: Diagnosis not present

## 2019-05-05 DIAGNOSIS — M545 Low back pain: Secondary | ICD-10-CM | POA: Diagnosis not present

## 2019-05-05 MED ORDER — OMEPRAZOLE 20 MG PO CPDR: 20.0000 mg | DELAYED_RELEASE_CAPSULE | Freq: Every day | ORAL | 5 refills | Status: DC

## 2019-05-05 MED ORDER — MELOXICAM 7.5 MG PO TABS: 7.5000 mg | ORAL_TABLET | Freq: Every day | ORAL | 0 refills | Status: DC

## 2019-05-05 NOTE — Progress Notes (Signed)
   CC: thigh pain and back pain  HPI: [name] is a 58 y.o. female with PMH significant for sarcoidosis chronically on plaquenil and prednisone who presents to [facility] today with lumbar back pain and thigh pain of one month duration.   Bilateral upper thigh pain Patient was seen 2 weeks ago for lumbar back pain which was thought to be MSK and she was prescribed a course of meloxicam. She feels that the meloxicam helped her back pain in the short term, but now the pain is bothering her more in the bilateral posterior thighs. Pain is worst when she first gets up in the morning and persists throughout the day. No paresthesias or weakness. No loss of urine or feces. No lateral or anterior thigh pain. Patient chronically takes prednisone and gabapentin. She notes recent weight gain and deconditioning.  Review of Symptoms:  See HPI for ROS.   CC, SH/smoking status, and VS noted.  Objective: BP 138/74   Pulse 72   Wt 213 lb 3.2 oz (96.7 kg)   SpO2 97% Comment: @2L   BMI 35.48 kg/m  GEN: NAD, alert, cooperative, and pleasant. RESPIRATORY: Comfortable work of breathing, speaks in full sentences CV: Regular rate noted, distal extremities well perfused and warm without edema GI: Soft, nondistended SKIN: warm and dry, no rashes or lesions NEURO: II-XII grossly intact MSK: Moves 4 extremities equally PSYCH: AAOx3, appropriate affect MSK: mild tenderness and tightness in bilateral posterior upper legs. No point tenderness over piriformis. No anterolateral pain. Back Exam:  Inspection: Unremarkable  Palpable tenderness: None. Range of Motion:  Flexion 45 deg; Extension 45 deg; Side Bending to 45 deg bilaterally; Rotation to 45 deg bilaterally  Leg strength: Quad: 5/5 Hamstring: 5/5 Hip flexor: 5/5 Hip abductors: 5/5  Sensory change: Gross sensation intact to all lumbar and sacral dermatomes.  Reflexes: 2+ at both patellar tendons Gait unremarkable. SLR laying: Negative  XSLR laying: Negative   FABER: negative.    Assessment and plan:  Chronic bilateral low back pain without sciatica  Likely MSK. No red flag symptoms. Hold off muscle relaxer d/t chronic O2 use. Cautiously use NSAIDs given chronic prednisone and Hx GI bleed. Discussed this with the patient. Refilled prilosec.  - meloxicam (MOBIC) 7.5 MG tablet; Take 1 tablet (7.5 mg total) by mouth daily.  Dispense: 15 tablet; Refill: 0 - omeprazole (PRILOSEC) 20 MG capsule; Take 1 capsule (20 mg total) by mouth daily.  Dispense: 30 capsule; Refill: 5 - Ambulatory referral to Physical Therapy   Everrett Coombe, MD,MS,  PGY3 05/05/2019 12:11 PM

## 2019-05-05 NOTE — Patient Instructions (Signed)
It was a pleasure seeing you today in our clinic. Today we discussed thigh pain and back pain. Here is the treatment plan we have discussed and agreed upon together:  A consult was placed to PT at today's visit.  You will receive a call to schedule an appointment. If you do not receive a call within two weeks please call our office so we can place the consult again.  Our clinic's number is [phone]. Please call with questions or concerns about what we discussed today.  Be well, Dr. Burr Medico

## 2019-05-13 ENCOUNTER — Ambulatory Visit: Payer: Medicare Other | Admitting: Physical Therapy

## 2019-05-19 ENCOUNTER — Ambulatory Visit: Payer: Medicare Other | Attending: Physical Therapy | Admitting: Physical Therapy

## 2019-06-03 ENCOUNTER — Other Ambulatory Visit: Payer: Self-pay | Admitting: Internal Medicine

## 2019-06-03 ENCOUNTER — Other Ambulatory Visit: Payer: Self-pay | Admitting: *Deleted

## 2019-06-03 DIAGNOSIS — K219 Gastro-esophageal reflux disease without esophagitis: Secondary | ICD-10-CM

## 2019-06-03 MED ORDER — PREDNISONE 10 MG PO TABS: ORAL_TABLET | ORAL | 3 refills | Status: DC

## 2019-06-03 MED ORDER — FLUTICASONE PROPIONATE 50 MCG/ACT NA SUSP: 2.0000 | Freq: Every day | NASAL | 3 refills | Status: DC | PRN

## 2019-06-03 MED ORDER — ALBUTEROL SULFATE HFA 108 (90 BASE) MCG/ACT IN AERS: 2.0000 | INHALATION_SPRAY | Freq: Four times a day (QID) | RESPIRATORY_TRACT | 0 refills | Status: DC | PRN

## 2019-06-04 ENCOUNTER — Other Ambulatory Visit: Payer: Self-pay

## 2019-06-04 DIAGNOSIS — D869 Sarcoidosis, unspecified: Secondary | ICD-10-CM

## 2019-06-04 MED ORDER — HYDROXYCHLOROQUINE SULFATE 200 MG PO TABS: 200.0000 mg | ORAL_TABLET | Freq: Two times a day (BID) | ORAL | 5 refills | Status: DC

## 2019-06-04 MED ORDER — METOPROLOL TARTRATE 25 MG PO TABS: 25.0000 mg | ORAL_TABLET | Freq: Two times a day (BID) | ORAL | 11 refills | Status: DC

## 2019-06-04 MED ORDER — ATORVASTATIN CALCIUM 20 MG PO TABS: 20.0000 mg | ORAL_TABLET | Freq: Every day | ORAL | 3 refills | Status: DC

## 2019-06-04 MED ORDER — OMEPRAZOLE 20 MG PO CPDR: 20.0000 mg | DELAYED_RELEASE_CAPSULE | Freq: Every day | ORAL | 5 refills | Status: DC

## 2019-06-04 MED ORDER — SERTRALINE HCL 50 MG PO TABS: 50.0000 mg | ORAL_TABLET | Freq: Every day | ORAL | 3 refills | Status: DC

## 2019-06-04 MED ORDER — STIOLTO RESPIMAT 2.5-2.5 MCG/ACT IN AERS: 2.0000 | INHALATION_SPRAY | Freq: Every day | RESPIRATORY_TRACT | 6 refills | Status: DC

## 2019-06-04 MED ORDER — GABAPENTIN 300 MG PO CAPS: 600.0000 mg | ORAL_CAPSULE | Freq: Every day | ORAL | 0 refills | Status: DC

## 2019-06-05 ENCOUNTER — Ambulatory Visit: Payer: Medicare Other | Admitting: Physical Therapy

## 2019-06-11 ENCOUNTER — Ambulatory Visit: Payer: BLUE CROSS/BLUE SHIELD | Admitting: Family Medicine

## 2019-06-11 ENCOUNTER — Telehealth: Payer: Self-pay | Admitting: Internal Medicine

## 2019-06-11 MED ORDER — STIOLTO RESPIMAT 2.5-2.5 MCG/ACT IN AERS: 2.0000 | INHALATION_SPRAY | Freq: Every day | RESPIRATORY_TRACT | 2 refills | Status: DC

## 2019-06-11 MED ORDER — TRIAMTERENE-HCTZ 37.5-25 MG PO TABS: 1.0000 | ORAL_TABLET | Freq: Every day | ORAL | 2 refills | Status: DC

## 2019-06-11 NOTE — Telephone Encounter (Signed)
Received a call from [name] stating that pt is needing a refill on Stiolto and Maxzide.  Looking at pt's med list, the stiolto was prescribed by her PCP Dr.Olson but instead of it going to the pharmacy, it has an order class as sample.   Dr. Melvyn Novas, please advise if you are okay if we fix the Rx for Stiolto and send it to [facility] for pt. Also, please advise if it is okay to refill pt's Maxzide. Thanks!

## 2019-06-11 NOTE — Telephone Encounter (Signed)
That's fine

## 2019-06-11 NOTE — Telephone Encounter (Signed)
Rx's for maxide and [employer] sent to [facility]. Nothing further is needed.

## 2019-06-12 ENCOUNTER — Ambulatory Visit: Payer: Medicare Other | Admitting: Physical Therapy

## 2019-06-12 ENCOUNTER — Other Ambulatory Visit: Payer: Self-pay

## 2019-06-12 ENCOUNTER — Ambulatory Visit (INDEPENDENT_AMBULATORY_CARE_PROVIDER_SITE_OTHER): Payer: Medicare Other | Admitting: Family Medicine

## 2019-06-12 ENCOUNTER — Encounter: Payer: Self-pay | Admitting: Family Medicine

## 2019-06-12 DIAGNOSIS — M545 Low back pain, unspecified: Secondary | ICD-10-CM

## 2019-06-12 DIAGNOSIS — G8929 Other chronic pain: Secondary | ICD-10-CM

## 2019-06-12 MED ORDER — MELOXICAM 7.5 MG PO TABS: 7.5000 mg | ORAL_TABLET | Freq: Every day | ORAL | 0 refills | Status: DC

## 2019-06-12 NOTE — Assessment & Plan Note (Signed)
She reports no significant change from her previous visit with Dr. Burr Medico.  She continues to note sharp lower back pain with radiation to her popliteal fossa bilaterally.  She has not yet been seen by physical therapy.  No numbness noted.  CT from 02/2019 was reviewed with Dr. Erin Hearing - No obvious bony pathology.  She does have a distant history of breast cancer (2016).  No evidence of metastases on CT abdomen pelvis.  Further MR imaging would not likely influence therapy at this time. -Encouraged Tylenol use -Refilled meloxicam -Follow-up with physical therapy

## 2019-06-12 NOTE — Patient Instructions (Signed)
I am sorry that you have had this continued back pain.  I do not think that additional imaging is necessary at this time.  Continue taking Tylenol and Mobic for your pain.  Please go to physical therapy.  It will be very helpful in reducing your back pain.  I do not think that we will take away your pain entirely but I think that medication and therapy can improve you pain a lot.

## 2019-06-12 NOTE — Progress Notes (Signed)
    Subjective:  [name] is a 58 y.o. female who presents to the [facility] today with a chief complaint of chronic lower back pain.   HPI: Low back pain Ms. Tremain reports that she has been experiencing lower back pain for the past 3 months now.  She is previously been seen by 2 other providers.  She was last seen on 6/22.  At that time, she is told to begin taking Mobic and to be seen by physical therapy.  She has not been seen by physical therapy yet.  She has a visit scheduled with physical therapy on 8/13.  Since her previous visit, she reports no changes in the character of her pain that she feels subjectively worse.  She continues to note significant sharp lower back pain with radiation to the back of her knees bilaterally.  This seems to be present throughout the day but is worse with movements and exercise.  She reports that she is not able to stand at the sink to wash dishes.  She denies saddle paresthesias.  Chief Complaint noted Review of Symptoms - see HPI  Objective:  Physical Exam: BP 124/64   Pulse 75   Ht 5\' 5"  (1.651 m)   Wt 217 lb (98.4 kg)   SpO2 94%   BMI 36.11 kg/m    Gen: NAD, resting comfortably.  Able to stand up on her own without assistance and climb up onto the exam table with mild discomfort.  She is not able to lie flat on the exam table. MSK: no edema, cyanosis, or clubbing noted.  Straight leg raise negative bilaterally though she did note shooting pains reaching to her popliteal fossa with leg raise to 30 degrees bilaterally.  No paraspinal tenderness.  No step-offs examination of spine.  No tenderness to palpation of SI joints. Skin: warm, dry   No results found for this or any previous visit (from the past 72 hour(s)).   Assessment/Plan:  Acute right-sided low back pain without sciatica She reports no significant change from her previous visit with Dr. Burr Medico.  She continues to note sharp lower back pain with radiation to her popliteal fossa  bilaterally.  She has not yet been seen by physical therapy.  No numbness noted.  CT from 02/2019 was reviewed with Dr. Erin Hearing - No obvious bony pathology.  She does have a distant history of breast cancer (2016).  No evidence of metastases on CT abdomen pelvis.  Further MR imaging would not likely influence therapy at this time. -Encouraged Tylenol use -Refilled meloxicam -Follow-up with physical therapy

## 2019-06-17 ENCOUNTER — Other Ambulatory Visit: Payer: Self-pay

## 2019-06-17 DIAGNOSIS — Z170 Estrogen receptor positive status [ER+]: Secondary | ICD-10-CM

## 2019-06-17 DIAGNOSIS — C50411 Malignant neoplasm of upper-outer quadrant of right female breast: Secondary | ICD-10-CM

## 2019-06-17 MED ORDER — ANASTROZOLE 1 MG PO TABS: 1.0000 mg | ORAL_TABLET | Freq: Every day | ORAL | 3 refills | Status: DC

## 2019-06-26 ENCOUNTER — Ambulatory Visit: Payer: Medicare HMO | Attending: Family Medicine | Admitting: Physical Therapy

## 2019-06-26 ENCOUNTER — Encounter: Payer: Self-pay | Admitting: Physical Therapy

## 2019-06-26 ENCOUNTER — Other Ambulatory Visit: Payer: Self-pay

## 2019-06-26 VITALS — HR 96

## 2019-06-26 DIAGNOSIS — G8929 Other chronic pain: Secondary | ICD-10-CM

## 2019-06-26 DIAGNOSIS — M545 Low back pain: Secondary | ICD-10-CM | POA: Insufficient documentation

## 2019-06-26 NOTE — Therapy (Signed)
[place], [place], [address] Phone: [phone]   Fax:  [phone]  Physical Therapy Evaluation  Patient Details  Name: [name] MRN: [id number] Date of Birth: 1961/09/03 Referring Provider (PT): Lissa Morales, MD   Encounter Date: 06/26/2019  PT End of Session - 06/26/19 1222    Visit Number  1    Number of Visits  6    Date for PT Re-Evaluation  08/07/19    Authorization Type  [employer] MCR/BCBS    PT Start Time  1100    PT Stop Time  1148    PT Time Calculation (min)  48 min    Activity Tolerance  Patient tolerated treatment well    Behavior During Therapy  [facility] for tasks assessed/performed       Past Medical History:  Diagnosis Date  . Acute on chronic respiratory failure ([place]) 10/09/2016  . Allergic rhinitis   . Breast cancer ([place]) 2016   right breast  . COPD, mild ([place]) FOLLOWED BY DR Melvyn Novas  . GERD (gastroesophageal reflux disease)   . HTN (hypertension)   . Hypertension   . Iron deficiency anemia   . Long-term current use of steroids SYMBICORT INHALER  . 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 Overweight (BMI 25.0-29.9) 05/11/2016  . Personal history of radiation therapy 2016  . Sarcoidosis STABLE PER CXR JUNE 2013  . Shortness of breath   . Sickle cell trait [facility])     Past Surgical History:  Procedure Laterality Date  . ADJUSTABLE SUTURE MANIPULATION  05/22/2012   Procedure: ADJUSTABLE SUTURE MANIPULATION;  Surgeon: Dara Hoyer, MD;  Location: [facility];  Service: Ophthalmology;  Laterality: Right;  . BREAST LUMPECTOMY Right 2016  . CESAREAN SECTION  1986   W/ BILATERAL TUBAL LIGATION  . COLONOSCOPY WITH PROPOFOL N/A 02/21/2019   Procedure: COLONOSCOPY WITH PROPOFOL;  Surgeon: Irene Shipper, MD;  Location: [facility];  Service: Endoscopy;  Laterality: N/A;  . EYE SURGERY    . MEDIAN RECTUS REPAIR  05/22/2012   Procedure: MEDIAN RECTUS REPAIR;  Surgeon: Dara Hoyer,  MD;  Location: [facility];  Service: Ophthalmology;  Laterality: Bilateral;  INFERIOR RECTUS RESECTION WITH ADJUSTIBLE SUTURES RIGHT EYE   . POLYPECTOMY  02/21/2019   Procedure: POLYPECTOMY;  Surgeon: Irene Shipper, MD;  Location: [facility];  Service: Endoscopy;;  . RADIOACTIVE SEED GUIDED PARTIAL MASTECTOMY WITH AXILLARY SENTINEL LYMPH NODE BIOPSY Right 08/18/2015   Procedure: RADIOACTIVE SEED GUIDED PARTIAL MASTECTOMY WITH AXILLARY SENTINEL LYMPH NODE BIOPSY;  Surgeon: Stark Klein, MD;  Location: [place];  Service: General;  Laterality: Right;  . TOTAL ROBOTIC ASSISTED LAPAROSCOPIC HYSTERECTOMY  12-30-2010   SYMPTOMATIC UTERINE FIBROIDS  . UPPER TEETH EXTRACTION'S  1992    Vitals:   06/26/19 1106  Pulse: 96  SpO2: 93%     Subjective Assessment - 06/26/19 1106    Subjective  Pt. is a 58 y/o female referred to therapy for LBP with radiating into bilateral posterior thigh and knee region. She reports onset of symptoms about 3 1/2 months ago-she had gained weight but no overt mechanism of injury noted, "woke up" with pain one day. Symptoms are worse when getting up in the morning and with initial standing and walking after sitting down but ease somewhat with more walking. She reports limited standing tolerance for activities such as cooking. See PMH-pt. does have history breast CA 2016, CT from 4/20 was reviewed by  MD with no bony abnormalities noted. Pt. denies bowel or bladder changes, no saddle parasthesias.    Pertinent History  breast CA, COPD on 2-3L O2, sarcoidosis, sickle cell trait, obesity    Limitations  House hold activities;Lifting;Standing;Walking    How long can you sit comfortably?  30-40 minutes    How long can you stand comfortably?  10-15 minutes    How long can you walk comfortably?  10-15 minutes    Diagnostic tests  CT scan from 4/20 reviewed by MD    Patient Stated Goals  Improve pain and ability to exercise    Currently in Pain?   Yes    Pain Score  5     Pain Location  Back    Pain Orientation  Left;Right    Pain Descriptors / Indicators  Aching    Pain Type  Chronic pain    Pain Radiating Towards  bilat. posterior thigh distal to knee region    Pain Onset  More than a month ago    Pain Frequency  Constant    Aggravating Factors   worse when getting up in AM, worse with walking and standing    Pain Relieving Factors  Medication, sitting is better than standing or walking    Effect of Pain on Daily Activities  limits mobility tolerance for community ambulation, limited standing tolerance for activities such as cooking         [facility] PT Assessment - 06/26/19 0001      Assessment   Medical Diagnosis  Chronic LBP without sciatica    Referring Provider (PT)  Sherren Mocha McDiarmid, MD    Onset Date/Surgical Date  03/14/19   estimated per subjective report   Prior Therapy  none      Precautions   Precaution Comments  COPD on 2-3L O2, history of breast CA 2016      Restrictions   Weight Bearing Restrictions  No      Balance Screen   Has the patient fallen in the past 6 months  No      Home Environment   Additional Comments  daughter assists with transportation      Prior Function   Level of Independence  Independent with basic ADLs;Independent with community mobility without device      Cognition   Overall Cognitive Status  Within Functional Limits for tasks assessed      Observation/Other Assessments   Focus on Therapeutic Outcomes (FOTO)   53% limited      Sensation   Light Touch  Appears Intact   L2-S2 dermatomes     ROM / Strength   AROM / PROM / Strength  AROM;PROM;Strength      AROM   Overall AROM Comments  bilat. hip AROM/PROM grossly WFL, increased LBP with radiating into bilat. posterior thighs with lumbar extension    AROM Assessment Site  Lumbar    Lumbar Flexion  100    Lumbar Extension  20    Lumbar - Right Side Bend  37    Lumbar - Left Side Bend  40    Lumbar - Right Rotation  WFL     Lumbar - Left Rotation  [facility]      Strength   Strength Assessment Site  Hip;Knee;Ankle    Right/Left Hip  Right;Left    Right Hip Flexion  4/5    Right Hip External Rotation   4/5    Right Hip Internal Rotation  4+/5    Left Hip Flexion  4/5    Left Hip External Rotation  4/5    Left Hip Internal Rotation  4+/5    Right/Left Knee  Right;Left    Right Knee Flexion  5/5    Right Knee Extension  4+/5    Left Knee Flexion  5/5    Left Knee Extension  4+/5    Right/Left Ankle  Right;Left    Right Ankle Dorsiflexion  5/5    Right Ankle Inversion  5/5    Right Ankle Eversion  5/5    Left Ankle Dorsiflexion  5/5    Left Ankle Inversion  5/5    Left Ankle Eversion  5/5      Flexibility   Soft Tissue Assessment /Muscle Length  --   tight hamstrings bilat.     Palpation   Palpation comment  Tender to palpation with muscle spasm right lumbar paraspinals into posterolateral hip      Special Tests   Other special tests  SLR (-)                Objective measurements completed on examination: See above findings.      [place] Adult PT Treatment/Exercise - 06/26/19 0001      Exercises   Exercises  Lumbar      Lumbar Exercises: Stretches   Single Knee to Chest Stretch  Right;Left;5 reps    Single Knee to Chest Stretch Limitations  5-10 sec holds with therapist assist    Lower Trunk Rotation  5 reps    Lower Trunk Rotation Limitations  5-10 second holds      Lumbar Exercises: Supine   Pelvic Tilt  10 reps    Glut Set  10 reps    Clam Limitations  instructed in HEP    Bent Knee Raise  5 reps      Manual Therapy   Manual Therapy  Soft tissue mobilization    Soft tissue mobilization  STM right lumbar paraspinals and posterolateral hip in left sidelying             PT Education - 06/26/19 1222    Education Details  HEP, potential symptom etiology, POC-issued green [employer] for HEP    Person(s) Educated  Patient    Methods  Explanation;Demonstration;Tactile  cues;Verbal cues;Handout    Comprehension  Returned demonstration;Verbalized understanding          PT Long Term Goals - 06/26/19 1232      PT LONG TERM GOAL #1   Title  Independent with HEP    Baseline  needs HEP    Time  6    Period  Weeks    Status  New    Target Date  08/07/19      PT LONG TERM GOAL #2   Title  Improve FOTO outcome measure score to 39% or less impairment    Baseline  53% limited    Time  6    Period  Weeks    Status  New    Target Date  08/07/19      PT LONG TERM GOAL #3   Title  Tolerate standing for cooking, chores periods 20-30 minutes with pain 3/10 or less    Baseline  limited 10-15 minutes, pain 5/10 or higher    Time  6    Period  Weeks    Status  New    Target Date  08/07/19      PT LONG TERM GOAL #4   Title  Tolerate ambulation  for activities such as grocery shopping periods 20-30 minutes with pain 3/10 or less    Baseline  10-15 minutes with pain 5/10 or higher    Time  6    Period  Weeks    Status  New    Target Date  08/07/19      PT LONG TERM GOAL #5   Title  Increase bilat. hip and quad strength 1/2 MMT grade to improve ability for sit>stand from low chairs    Baseline  see objective, difficulty getting up    Time  6    Period  Weeks    Status  New    Target Date  08/07/19             Plan - 06/26/19 1223    Clinical Impression Statement  Pt. presents with LBP with radiating pain into posterior thigh and knee region. Differential diagnosis could include lumbar radiculopathy vs. muscular pain. Bilateral symptoms along with flexion bias ROM and symptoms worse with standing and walking could be consistent with stenosis/neurogenic claudication but pt. report of symptoms initially worse with standing/walking but eased with more prolonged walking would be atypical. Pt. would benefit from PT to help relieve pain and address current associated functional limitations. Due to her comorbidities with COPD and reported breathing  difficulty with wearing mask pt. wishes to limit her "in person" visits so plan focus HEP.    Personal Factors and Comorbidities  Comorbidity 3+;Transportation    Comorbidities  COPD, history breast CA, sarcoidosis, sickle cell, obesity    Examination-Activity Limitations  Hygiene/Grooming;Sleep;Squat;Lift;Bend;Stand;Locomotion Level    Examination-Participation Restrictions  Laundry;Cleaning;Meal Prep;Community Activity    Stability/Clinical Decision Making  Evolving/Moderate complexity    Clinical Decision Making  Moderate    Rehab Potential  Good    PT Frequency  --   6 visits   PT Duration  6 weeks    PT Treatment/Interventions  ADLs/Self Care Home Management;Cryotherapy;Electrical Stimulation;Moist Heat;Functional mobility training;Therapeutic activities;Therapeutic exercise;Manual techniques;Dry needling;Neuromuscular re-education    PT Next Visit Plan  review HEP as needed, check response flexion bias ROM and stretches/any centralization?-pending response continue flexion bias ROM and core strengthening/stabilization, stretch hamstrings as tolerated    PT Home Exercise Plan  pelvic titls, SKTC with strap, LTR, supine marches, glut set, clamshell, hamstring stretch in sitting    Consulted and Agree with Plan of Care  Patient       Patient will benefit from skilled therapeutic intervention in order to improve the following deficits and impairments:  Pain, Impaired flexibility, Decreased strength, Decreased activity tolerance, Decreased range of motion, Obesity, Difficulty walking, Increased muscle spasms  Visit Diagnosis: 1. Chronic bilateral low back pain without sciatica        Problem List Patient Active Problem List   Diagnosis Date Noted  . Acute right-sided low back pain without sciatica 03/28/2019  . Benign neoplasm of ascending colon   . Acute GI bleeding 02/19/2019  . Anemia 01/10/2019  . Acute on chronic respiratory failure ([place]) 12/17/2018  . Multifocal pneumonia  12/17/2018  . Acute exacerbation of chronic obstructive pulmonary disease (COPD) ([place]) 12/17/2018  . Chronic cough 11/26/2017  . Obesity (BMI 30-39.9) 01/10/2017  . Dyspnea on exertion 11/09/2016  . Chronic respiratory failure with hypoxia and hypercapnia ([place]) 10/29/2016  . Depression with anxiety 08/07/2016  . Breast cancer of upper-outer quadrant of right female breast ([place]) 08/04/2015  . GERD (gastroesophageal reflux disease) 12/21/2012  . Hyperlipidemia 12/21/2012  . SICKLE-CELL TRAIT 04/15/2008  . Sarcoidosis (  [place]) 01/10/2007  . HYPERTENSION, BENIGN SYSTEMIC 01/10/2007  . 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 [place], [place], [address] Phone: [phone]   Fax:  [phone]  Name: [name] MRN: [id number] Date of Birth: [birth year]

## 2019-07-02 ENCOUNTER — Other Ambulatory Visit: Payer: Self-pay | Admitting: General Surgery

## 2019-07-02 DIAGNOSIS — Z853 Personal history of malignant neoplasm of breast: Secondary | ICD-10-CM

## 2019-07-07 IMAGING — CR DG CHEST 2V
2 series · 2 of 2 positions shown · non-contrast
Comparison: September 07, 2017

CLINICAL DATA: Cough and shortness of breath

EXAM:
CHEST - 2 VIEW

[w chest lat]
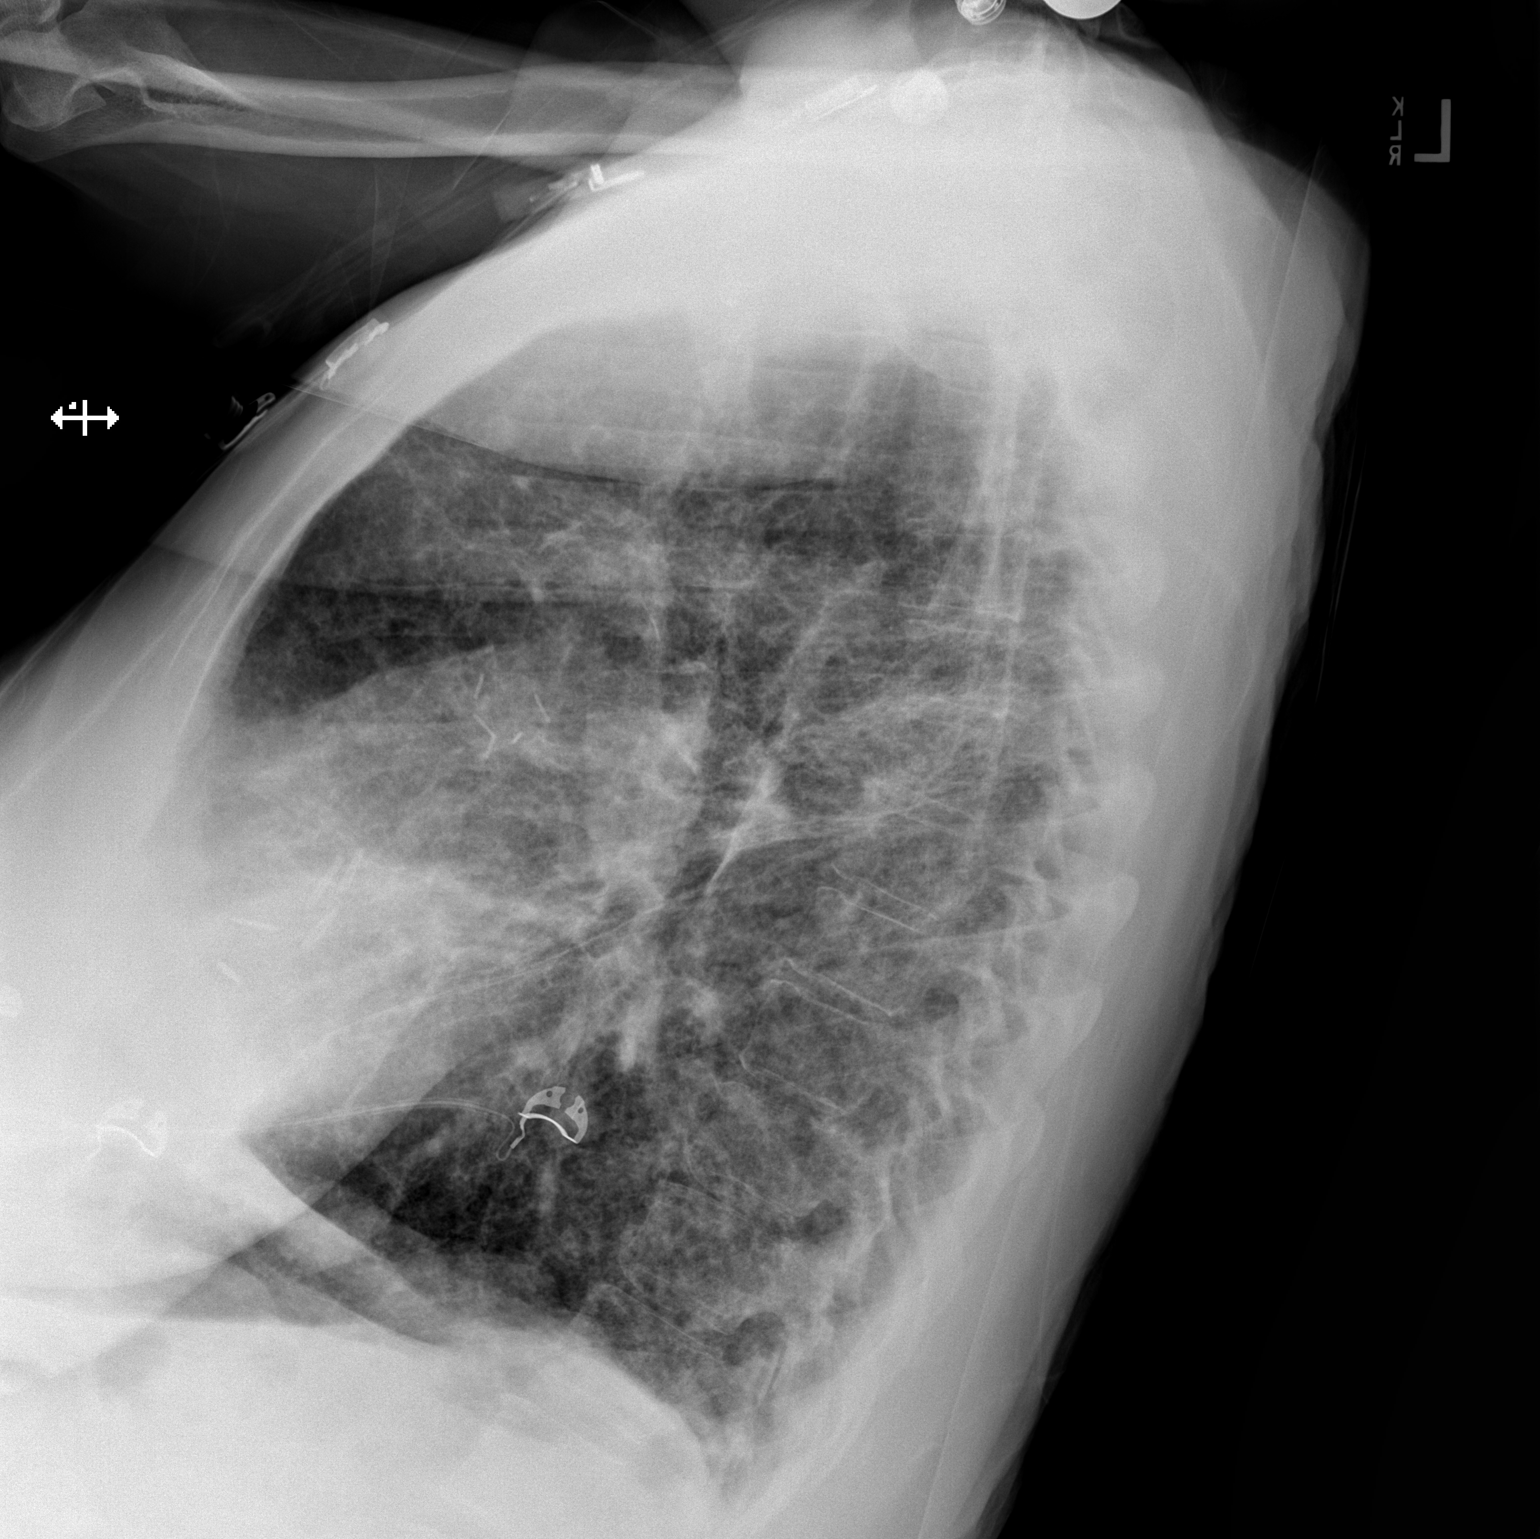

[x chest ap]
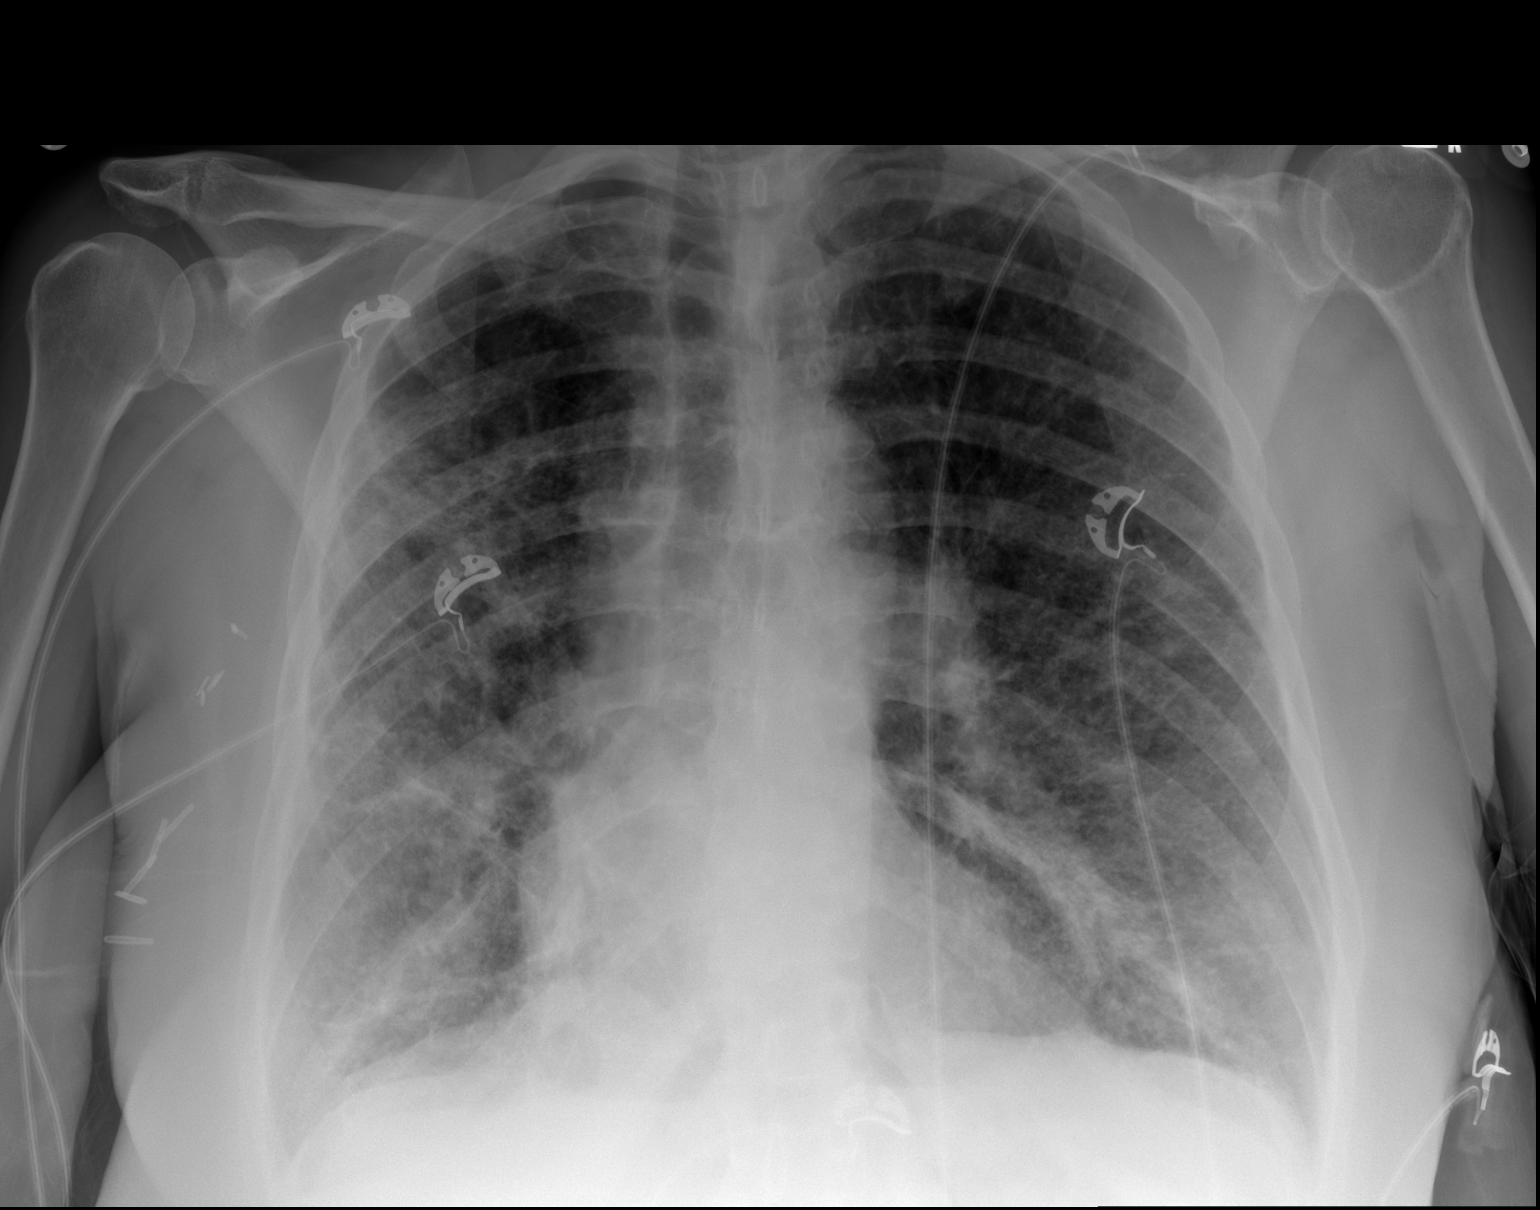

[2 of 2 positions shown; findings below may reference images not displayed]

FINDINGS: There is patchy infiltrate in portions of the lingula and right
middle lobe. There is also patchy infiltrate in the posterior
segment right upper lobe. There are scattered areas of scarring
elsewhere. Heart size and pulmonary vascular normal. No adenopathy.
There are surgical clips in the right lateral breast region.
IMPRESSION: Multifocal pneumonia. Areas of scattered scarring bilaterally. Heart
size normal. No adenopathy evident.

## 2019-07-17 ENCOUNTER — Ambulatory Visit: Payer: BLUE CROSS/BLUE SHIELD | Attending: Physical Therapy | Admitting: Physical Therapy

## 2019-07-18 ENCOUNTER — Ambulatory Visit (INDEPENDENT_AMBULATORY_CARE_PROVIDER_SITE_OTHER): Payer: Medicare HMO | Admitting: Family Medicine

## 2019-07-18 ENCOUNTER — Other Ambulatory Visit: Payer: Self-pay

## 2019-07-18 ENCOUNTER — Encounter: Payer: Self-pay | Admitting: Family Medicine

## 2019-07-18 DIAGNOSIS — E669 Obesity, unspecified: Secondary | ICD-10-CM

## 2019-07-18 DIAGNOSIS — G8929 Other chronic pain: Secondary | ICD-10-CM | POA: Diagnosis not present

## 2019-07-18 DIAGNOSIS — M545 Low back pain, unspecified: Secondary | ICD-10-CM

## 2019-07-18 DIAGNOSIS — Z23 Encounter for immunization: Secondary | ICD-10-CM

## 2019-07-18 MED ORDER — MELOXICAM 7.5 MG PO TABS: 7.5000 mg | ORAL_TABLET | Freq: Every day | ORAL | 0 refills | Status: DC

## 2019-07-18 NOTE — Patient Instructions (Addendum)
I have refilled your mobic prescription.  There are only 15 in the prescription, so please try to use them only if you need it. It is important to go to your physical therapy appointments and continue doing the exercises at home.    I would like you to stop eating the chips ahoy and ginger ale, or at the very least limit them to one serving a day for each.    I would like you to come back in three months.    Have a great day,   Clemetine Marker MD

## 2019-07-18 NOTE — Progress Notes (Signed)
   [facility] Phone: [phone]     [name] - 58 y.o. female MRN [id number]  Date of birth: 12/16/1960  Subjective:   cc: leg and back pain, weight  HPI:  Lower back and leg pain: patient states her lower back pain has resolved.  she still has leg pain though.  Sometimes both legs, sometimes one leg.  Worse in  the morning.  She is going to a Physical therapist, but she Forgot to go yesterday. She was on meloxicam, but hasn't taken it in a while because she ran out.  She takes tylenol 1000mg  once a day.    Weight gain - she says she has gained a lot of weight since the last time she was here.  She blames some of it on the prednisone she has to take for sarcoidosis. She also says her family brings her boxes of chips ahoy and she will eat have the box in a day.    ROS: See HPI for pertinent positives and negatives  Past Medical History  Family history reviewed for today's visit. No changes.  Health Maintenance:  -  There are no preventive care reminders to display for this patient.  -  reports that she quit smoking about 15 years ago. She has a 20.00 pack-year smoking history. She has never used smokeless tobacco.  Objective:   BP 104/86   Pulse 71   Wt 219 lb (99.3 kg)   SpO2 98%   BMI 36.44 kg/m  Gen: NAD, alert and oriented, cooperative with exam CV: normal rate, regular rhythm. No murmurs, no rubs.  Resp: LCTAB, no wheezes, crackles. normal work of breathing Msk: no spinal tenderness. No edema, warm, normal tone, moves UE/LE spontaneously Skin: No rashes, no lesions Psych: Appropriate behavior  Assessment/Plan:   Acute right-sided low back pain without sciatica Has gone to PT once.  Pain in back is better.  Only taking tylenol 1000mg  a day - increase to 1000mg  BID tylenol - refilled meloxicam PRN - encouraged to continue going to PT  Obesity (BMI 30-39.9) Has gained 20 lbs since last visit with me.  Discussed good eating habits.   Patient will stop drinking sodas and will limit her snacks/cookies to no more than one serving a day.     Orders Placed This Encounter  Procedures  . Flu Vaccine QUAD 36+ mos IM    Meds ordered this encounter  Medications  . meloxicam (MOBIC) 7.5 MG tablet    Sig: Take 1 tablet (7.5 mg total) by mouth daily.    Dispense:  15 tablet    Refill:  0    Clemetine Marker, MD PGY-2 [facility]

## 2019-07-20 NOTE — Assessment & Plan Note (Signed)
Has gone to PT once.  Pain in back is better.  Only taking tylenol 1000mg  a day - increase to 1000mg  BID tylenol - refilled meloxicam PRN - encouraged to continue going to PT

## 2019-07-20 NOTE — Assessment & Plan Note (Signed)
Has gained 20 lbs since last visit with me.  Discussed good eating habits.  Patient will stop drinking sodas and will limit her snacks/cookies to no more than one serving a day.

## 2019-08-04 ENCOUNTER — Other Ambulatory Visit: Payer: Self-pay | Admitting: Internal Medicine

## 2019-08-11 ENCOUNTER — Other Ambulatory Visit: Payer: Self-pay | Admitting: Internal Medicine

## 2019-08-18 ENCOUNTER — Other Ambulatory Visit: Payer: Self-pay

## 2019-08-18 ENCOUNTER — Ambulatory Visit
Admission: RE | Admit: 2019-08-18 | Discharge: 2019-08-18 | Disposition: A | Discharge status: Home / Self Care | Payer: Medicare HMO | Source: Ambulatory Visit | Attending: General Surgery | Admitting: General Surgery

## 2019-08-18 DIAGNOSIS — Z853 Personal history of malignant neoplasm of breast: Secondary | ICD-10-CM

## 2019-08-27 ENCOUNTER — Other Ambulatory Visit: Payer: Self-pay | Admitting: Internal Medicine

## 2019-09-03 NOTE — Therapy (Signed)
[place], [place], [address] Phone: [phone]   Fax:  [phone]  Physical Therapy Evaluation  Patient Details  Name: [name] MRN: [id number] Date of Birth: [birth year] Referring Provider (PT): Lissa Morales, MD   Encounter Date: 06/26/2019    Past Medical History:  Diagnosis Date  . Acute on chronic respiratory failure ([place]) 10/09/2016  . Allergic rhinitis   . Breast cancer ([place]) 2016   right breast  . COPD, mild ([place]) FOLLOWED BY DR Melvyn Novas  . GERD (gastroesophageal reflux disease)   . HTN (hypertension)   . Hypertension   . Iron deficiency anemia   . Long-term current use of steroids SYMBICORT INHALER  . 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 Overweight (BMI 25.0-29.9) 05/11/2016  . Personal history of radiation therapy 2016  . Sarcoidosis STABLE PER CXR JUNE 2013  . Shortness of breath   . Sickle cell trait [facility])     Past Surgical History:  Procedure Laterality Date  . ADJUSTABLE SUTURE MANIPULATION  05/22/2012   Procedure: ADJUSTABLE SUTURE MANIPULATION;  Surgeon: Dara Hoyer, MD;  Location: [facility];  Service: Ophthalmology;  Laterality: Right;  . BREAST LUMPECTOMY Right 2016  . CESAREAN SECTION  1986   W/ BILATERAL TUBAL LIGATION  . COLONOSCOPY WITH PROPOFOL N/A 02/21/2019   Procedure: COLONOSCOPY WITH PROPOFOL;  Surgeon: Irene Shipper, MD;  Location: [facility];  Service: Endoscopy;  Laterality: N/A;  . EYE SURGERY    . MEDIAN RECTUS REPAIR  05/22/2012   Procedure: MEDIAN RECTUS REPAIR;  Surgeon: Dara Hoyer, MD;  Location: [facility];  Service: Ophthalmology;  Laterality: Bilateral;  INFERIOR RECTUS RESECTION WITH ADJUSTIBLE SUTURES RIGHT EYE   . POLYPECTOMY  02/21/2019   Procedure: POLYPECTOMY;  Surgeon: Irene Shipper, MD;  Location: [facility];  Service: Endoscopy;;  . RADIOACTIVE SEED GUIDED PARTIAL MASTECTOMY WITH AXILLARY SENTINEL LYMPH NODE  BIOPSY Right 08/18/2015   Procedure: RADIOACTIVE SEED GUIDED PARTIAL MASTECTOMY WITH AXILLARY SENTINEL LYMPH NODE BIOPSY;  Surgeon: Stark Klein, MD;  Location: [facility];  Service: General;  Laterality: Right;  . TOTAL ROBOTIC ASSISTED LAPAROSCOPIC HYSTERECTOMY  12-30-2010   SYMPTOMATIC UTERINE FIBROIDS  . UPPER TEETH EXTRACTION'S  1992    Vitals:   06/26/19 1106  Pulse: 96  SpO2: 93%                    Objective measurements completed on examination: See above findings.                   PT Long Term Goals - 06/26/19 1232      PT LONG TERM GOAL #1   Title  Independent with HEP    Baseline  needs HEP    Time  6    Period  Weeks    Status  New    Target Date  08/07/19      PT LONG TERM GOAL #2   Title  Improve FOTO outcome measure score to 39% or less impairment    Baseline  53% limited    Time  6    Period  Weeks    Status  New    Target Date  08/07/19      PT LONG TERM GOAL #3   Title  Tolerate standing for cooking, chores periods 20-30 minutes with pain 3/10 or less    Baseline  limited 10-15 minutes, pain 5/10 or higher  Time  6    Period  Weeks    Status  New    Target Date  08/07/19      PT LONG TERM GOAL #4   Title  Tolerate ambulation for activities such as grocery shopping periods 20-30 minutes with pain 3/10 or less    Baseline  10-15 minutes with pain 5/10 or higher    Time  6    Period  Weeks    Status  New    Target Date  08/07/19      PT LONG TERM GOAL #5   Title  Increase bilat. hip and quad strength 1/2 MMT grade to improve ability for sit>stand from low chairs    Baseline  see objective, difficulty getting up    Time  6    Period  Weeks    Status  New    Target Date  08/07/19               Patient will benefit from skilled therapeutic intervention in order to improve the following deficits and impairments:  Pain, Impaired flexibility, Decreased strength, Decreased activity tolerance,  Decreased range of motion, Obesity, Difficulty walking, Increased muscle spasms  Visit Diagnosis: Chronic bilateral low back pain without sciatica - Plan: PT plan of care cert/re-cert     Problem List Patient Active Problem List   Diagnosis Date Noted  . Acute right-sided low back pain without sciatica 03/28/2019  . Benign neoplasm of ascending colon   . Acute GI bleeding 02/19/2019  . Anemia 01/10/2019  . Acute on chronic respiratory failure ([place]) 12/17/2018  . Multifocal pneumonia 12/17/2018  . Acute exacerbation of chronic obstructive pulmonary disease (COPD) ([place]) 12/17/2018  . Chronic cough 11/26/2017  . Obesity (BMI 30-39.9) 01/10/2017  . Dyspnea on exertion 11/09/2016  . Chronic respiratory failure with hypoxia and hypercapnia ([place]) 10/29/2016  . Depression with anxiety 08/07/2016  . Breast cancer of upper-outer quadrant of right female breast ([place]) 08/04/2015  . GERD (gastroesophageal reflux disease) 12/21/2012  . Hyperlipidemia 12/21/2012  . SICKLE-CELL TRAIT 04/15/2008  . Sarcoidosis ([place]) 01/10/2007  . HYPERTENSION, BENIGN SYSTEMIC 01/10/2007  . 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  01/10/2007      PHYSICAL THERAPY DISCHARGE SUMMARY  Visits from Start of Care: 1  Current functional level related to goals / functional outcomes: Current status unknown-patient did not return after initial evaluation visit 06/26/19.   Remaining deficits: NA   Education / Equipment: NA Plan:                                                    Patient goals were not met. Patient is being discharged due to not returning since the last visit.  ?????          Beaulah Dinning, PT, DPT 09/03/19 4:59 PM    [facility] [address] [place], [place], [address] Phone: [phone]   Fax:  [phone]  Name: [name] MRN: [id number] Date of Birth: Apr 01, 1961

## 2019-09-22 ENCOUNTER — Other Ambulatory Visit: Payer: Self-pay | Admitting: Internal Medicine

## 2019-10-15 ENCOUNTER — Other Ambulatory Visit: Payer: Self-pay | Admitting: Internal Medicine

## 2019-10-22 ENCOUNTER — Other Ambulatory Visit: Payer: Self-pay | Admitting: Family Medicine

## 2019-10-22 DIAGNOSIS — D869 Sarcoidosis, unspecified: Secondary | ICD-10-CM

## 2019-10-22 DIAGNOSIS — K219 Gastro-esophageal reflux disease without esophagitis: Secondary | ICD-10-CM

## 2019-10-28 ENCOUNTER — Ambulatory Visit: Payer: BLUE CROSS/BLUE SHIELD | Admitting: Internal Medicine

## 2019-10-30 ENCOUNTER — Telehealth: Payer: Self-pay

## 2019-10-30 MED ORDER — GABAPENTIN 300 MG PO CAPS: 600.0000 mg | ORAL_CAPSULE | Freq: Every day | ORAL | 0 refills | Status: DC

## 2019-10-30 NOTE — Telephone Encounter (Signed)
RN returned call, patient requesting refill to mail order pharmacy for Gabapentin.  RN reviewed with MD, MD verbal approval.  Refill sent to mail order pharmacy. Pt aware.

## 2019-11-10 ENCOUNTER — Other Ambulatory Visit: Payer: Self-pay | Admitting: Internal Medicine

## 2019-11-17 ENCOUNTER — Ambulatory Visit (INDEPENDENT_AMBULATORY_CARE_PROVIDER_SITE_OTHER): Payer: Medicare HMO | Admitting: Internal Medicine

## 2019-11-17 ENCOUNTER — Encounter: Payer: Self-pay | Admitting: Internal Medicine

## 2019-11-17 ENCOUNTER — Ambulatory Visit (INDEPENDENT_AMBULATORY_CARE_PROVIDER_SITE_OTHER): Payer: Medicare HMO

## 2019-11-17 ENCOUNTER — Other Ambulatory Visit: Payer: Self-pay

## 2019-11-17 DIAGNOSIS — D869 Sarcoidosis, unspecified: Secondary | ICD-10-CM

## 2019-11-17 DIAGNOSIS — J9612 Chronic respiratory failure with hypercapnia: Secondary | ICD-10-CM | POA: Diagnosis not present

## 2019-11-17 DIAGNOSIS — J449 Chronic obstructive pulmonary disease, unspecified: Secondary | ICD-10-CM

## 2019-11-17 DIAGNOSIS — J9611 Chronic respiratory failure with hypoxia: Secondary | ICD-10-CM | POA: Diagnosis not present

## 2019-11-17 LAB — HEPATIC FUNCTION PANEL
ALT: 11 U/L (ref 0–35)
AST: 15 U/L (ref 0–37)
Albumin: 4.1 g/dL (ref 3.5–5.2)
Alkaline Phosphatase: 78 U/L (ref 39–117)
Bilirubin, Direct: 0.1 mg/dL (ref 0.0–0.3)
Total Bilirubin: 0.3 mg/dL (ref 0.2–1.2)
Total Protein: 7.3 g/dL (ref 6.0–8.3)

## 2019-11-17 LAB — BASIC METABOLIC PANEL
BUN: 16 mg/dL (ref 6–23)
CO2: 37 mEq/L — ABNORMAL HIGH (ref 19–32)
Calcium: 9.7 mg/dL (ref 8.4–10.5)
Chloride: 98 mEq/L (ref 96–112)
Creatinine, Ser: 0.98 mg/dL (ref 0.40–1.20)
GFR: 70.06 mL/min (ref >60.00)
Glucose, Bld: 92 mg/dL (ref 70–99)
Potassium: 3.8 mEq/L (ref 3.5–5.1)
Sodium: 142 mEq/L (ref 135–145)

## 2019-11-17 LAB — SEDIMENTATION RATE: Sed Rate: 48 mm/hr — ABNORMAL HIGH (ref 0–30)

## 2019-11-17 MED ORDER — STIOLTO RESPIMAT 2.5-2.5 MCG/ACT IN AERS: 2.0000 | INHALATION_SPRAY | Freq: Every day | RESPIRATORY_TRACT | 11 refills | Status: DC

## 2019-11-17 NOTE — Progress Notes (Signed)
Subjective:    Patient ID: [name], female   DOB: Apr 05, 1961    MRN: [id number]    Brief patient profile:  44 yobf quit smoking 10/2004 dx by transbronchial biopsy 5/92 with NCG  consistent with sarcoid. Since that time she's been off and on prednisone multiple  times with flares of coughing dyspnea and skin involvement > weaned off chronic prednisone Feb 2012 and placed back on 10/10/16 for deteriorating obstructive changes on pfts  With resp failure/ 02 dep        History of Present Illness  02/20/2017  f/u ov/Ziomara Birenbaum re:   GOLD III/ pred 10 mg daily and bevespi 2bid  and alb rare  Chief Complaint  Patient presents with  . Follow-up    6wk rov. pt states breathing is baseline. pt reports of sob with exertion & occ prod cough with green mucus mainly in the morning   Doe better :  [employer] = can't walk a nl pace on a flat grade s sob but does fine slow and flat eg shopping on  2lpm / ok at rest s 02  Uses 02 2lpm sleeping  rec Drop prednisone to 10 mg one half daily      NP eval 05/14/17  We will decrease your prednisone to 2.5 mg daily. We will prescribe 5 mg tablets. We will renew your prescription for Symbicort.  2 puffs twice daily. Rinse mouth after use. Follow up appointment with Dr. Melvyn Novas in 2 months. We will refer you to Pulmonary rehab > started Aug 2018  Referral to a dietician for weight loss >  at rehab     Date of Admission: 09/07/2017                    Date of Discharge: 09/09/17    Discharge Diagnoses/Problem List:  COPD GOLD stage IV HTN Anemia HLD Breast cancer s/p resection Sarcoidosis GERD      11/26/2017  f/u ov/Jaymon Dudek re:  Sarcoid/skin involvement chronic cough ? Sinus dz/ using med calendar well / pednisone 10 mg daily  Chief Complaint  Patient presents with  . Follow-up    Cough has improved some, but still producing some greenish yellow sputum first thing in the am.  She has not had to use her albuterol inhaler. She c/o shakiness in her  hands for the past couple of months.    less cough p augmentin but still am sputum green / no noct symptoms Doing well with rehab but sometimes has to turn 02 up to 4 lpm there but doesn't do so in other settings and concerned about wt gain  rec Start plaquenil 200 mg daily  Ok to adjust the prednisone 38m  from 1-2 pills in am as per med calendar See calendar for specific medication instructions    01/10/2018  f/u ov/Kenidi Elenbaas re:  Sarcoid/ plaquenil 200 mg daily plus pred 5 mg daily / last time off was one year prior/ 02 = 2lpm floor and 4lpm with ex Chief Complaint  Patient presents with  . Follow-up    repeat LFT's today. She states she has noticed some shaking in her hands for a while now and forgot to mention this at last ov. Her breathing is doing well and she is down to 5 mg pred.   Dyspnea:  Ex bike x 15-30 min / low resistance and 02 up to 3-4lpm sats in mid 90s Cough: no Sleep: on 2lpm / one pillow  SABA use:  Not  needing  rec Try prednisone 5 mg  Reduce to one half daily to see if not doing as well and if not increase prednisone to 5 mg and call me for higher dose of plaquenil  Late add: with esr high and lfts ok rec bid plaquenil and return in one month for pfts/lfts> could not tol 400 mg daily due to n and v but ok on 200 mg daily      05/21/2018  f/u ov/Sharlena Kristensen re:  codp GOLD IV/ 02 dep on pred 5 mg x 2  daily for sarcoid / intol of plaquenil > 200 mg (N/V) Using med cal well  Chief Complaint  Patient presents with  . Follow-up    Breathing unchanged since the last visit. She has prod cough in the am's with min green sputum. She is using her albuterol inhaler 1 x per wk on average.   Dyspnea:  On 3-4 lpm does [employer] / also able to do ex bike x 15 min  Cough: just in am , slt discolored mucus  SABA use: rare, usually if goes out in hear  02: 2lpm hs and 3-4 lpm daytime   rec Please see patient coordinator before you leave today  to schedule referral to dermatology  No change  in medications except use prednisone 5 mg  X 2 when breathing/ coughing worse then 1 daily thereafter Follow the medication calendar we reviewed today    11/22/2018  f/u ov/Catrena Vari re:  Sarcooid/ copd/ 02 dep resp failure/ indolent onset x 3  weeks no appetite fatigue  Increased sob on pred 5 mg daily / no med calendar  Chief Complaint  Patient presents with  . Follow-up    Pt c/o increased SOB and cough the past few days. Her cough is prod at times with yellow to clear sputum. She states her mouth is very dry in the mornings. Her appetite is poor and she is down 9 lbs since the last visit. She is using her albuterol inhaler 2 x per wk on average.    Dyspnea:  mb and back flat ok on 3lpm Pulsed when walks  Cough: none Sleeping: on side bed is horizontal SABA use: couple of times a week 02: 2lpm and up to 3lpm walk rec Please remember to go to the lab department   for your tests - we will call you with the results when they are available. Take 20 prednisone daily until better then 10 mg per day and w/in 3 days got worse more /sob/ decreased sats so admit 12/17/18   Admit date: 12/17/2018 Discharge date: 12/22/2018    DISCHARGE DIAGNOSES:  Multifocal pneumonia likely community-acquired Acute COPD exacerbation, improved Acute on chronic respiratory failure with hypoxia History of sarcoidosis on chronic steroids Acute kidney injury, resolved Essential hypertension History of breast cancer   12/30/2018  f/u ov/Zhana Jeangilles re: post hosp eval / sarcoid on plaquenil and floor of pred 5 mg daily/ 02 2lpm - 3lpm with activity / copd on stiolto Chief Complaint  Patient presents with  . Hospitalization Follow-up    Breathing has improved almost back to her normal baseline. She is coughing with minimal clear sputum. She has been using her albuterol inhaler about once per wk and she has used neb x 3 since hospital d/c.    Dyspnea:  [employer] = can't walk 100 yards even at a slow pace at a flat grade s stopping  due to sob   Cough: minimal clear  Sleeping: bed is flat/  props up on 2 pillows SABA use: as abov3 02:  2lpm 24/7  rec Prednisone 10 mg 2 daily until better, then 1daily  Please remember to go to the  x-ray department  for your tests - we will call you with the results when they are available    11/17/2019  f/u ov/Jamyah Folk re: sarcoid on plaquenil 200 as could not tol 400/day and pred 5 mg daily  Chief Complaint  Patient presents with  . Follow-up    COPD, Sarcoidosis, SOB, cough   Dyspnea:  Not limited by breathing from desired activities / uses [employer] parking though and very slow pace on 02  = MMRC2 = can't walk a nl pace on a flat grade s sob but does fine slow and flat   Cough: some in ams  Sleeping: on back, bed is flat, 2 pillows  SABA use: none  02: 2lpm hs, sitting,  When walking 3lpm    No obvious day to day or daytime variability or assoc excess/ purulent sputum or mucus plugs or hemoptysis or cp or chest tightness, subjective wheeze or overt sinus or hb symptoms.   Sleeping as above without nocturnal  or early am exacerbation  of respiratory  c/o's or need for noct saba. Also denies any obvious fluctuation of symptoms with weather or environmental changes or other aggravating or alleviating factors except as outlined above   No unusual exposure hx or h/o childhood pna/ asthma or knowledge of premature birth.  Current Allergies, Complete Past Medical History, Past Surgical History, Family History, and Social History were reviewed in [employer] record.  ROS  The following are not active complaints unless bolded Hoarseness, sore throat, dysphagia, dental problems, itching, sneezing,  nasal congestion or discharge of excess mucus or purulent secretions, ear ache,   fever, chills, sweats, unintended wt loss or wt gain, classically pleuritic or exertional cp,  orthopnea pnd or arm/hand swelling  or leg swelling, presyncope, palpitations, abdominal pain, anorexia,  nausea, vomiting, diarrhea  or change in bowel habits or change in bladder habits, change in stools or change in urine, dysuria, hematuria,  rash, arthralgias, visual complaints, headache, numbness, weakness or ataxia or problems with walking or coordination,  change in mood or  memory.        Current Meds  Medication Sig  . albuterol (PROVENTIL) (2.5 MG/3ML) 0.083% nebulizer solution Take 3 mLs (2.5 mg total) by nebulization every 6 (six) hours as needed for wheezing or shortness of breath.  Marland Kitchen albuterol (VENTOLIN HFA) 108 (90 Base) MCG/ACT inhaler INHALE 2 PUFFS EVERY 6 HOURS AS NEEDED FOR WHEEZING OR SHORTNESS OF BREATH  . anastrozole (ARIMIDEX) 1 MG tablet Take 1 tablet (1 mg total) by mouth daily.  Marland Kitchen aspirin EC 81 MG tablet Take 1 tablet (81 mg total) by mouth daily. Start 02/23/19  . atorvastatin (LIPITOR) 20 MG tablet Take 1 tablet (20 mg total) by mouth daily.  Marland Kitchen azithromycin (ZITHROMAX) 250 MG tablet Take two today and then one daily until finished  . erythromycin ophthalmic ointment Place 1 application into both eyes at bedtime.  . ferrous sulfate 325 (65 FE) MG tablet Take 1 tablet (325 mg total) by mouth daily with breakfast.  . fluticasone (FLONASE) 50 MCG/ACT nasal spray Place 2 sprays into both nostrils daily as needed for allergies.  Marland Kitchen gabapentin (NEURONTIN) 300 MG capsule Take 2 capsules (600 mg total) by mouth at bedtime.  . Guaifenesin (MUCINEX MAXIMUM STRENGTH) 1200 MG TB12 Take 1,200 mg by  mouth 2 (two) times daily as needed (chest congestion).   . hydroxychloroquine (PLAQUENIL) 200 MG tablet TAKE 1 TABLET TWICE DAILY  . metoprolol tartrate (LOPRESSOR) 25 MG tablet Take 1 tablet (25 mg total) by mouth 2 (two) times daily.  . Multiple Vitamin (MULTIVITAMIN) capsule Take 1 capsule by mouth daily.  Marland Kitchen omeprazole (PRILOSEC) 20 MG capsule TAKE 1 CAPSULE (20 MG TOTAL) BY MOUTH DAILY.  Marland Kitchen OXYGEN 2lpm with rest and 3-4 lpm with exertion  . predniSONE (DELTASONE) 10 MG tablet 1/2 to 1  daily as directed  . senna (SENOKOT) 8.6 MG TABS tablet Take 1 tablet (8.6 mg total) by mouth 2 (two) times daily.  . sertraline (ZOLOFT) 50 MG tablet Take 1 tablet (50 mg total) by mouth daily.  Marland Kitchen STIOLTO RESPIMAT 2.5-2.5 MCG/ACT AERS INHALE 2 PUFFS INTO THE LUNGS DAILY.  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 Unice Cobble  -h/o Daily prednisone since 2007   > off completely Feb 2012 with no problem> restarted  09/2016  ? 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 Some better but no worse off 04/2011 - Rehab started mid aug 2018  BACK PAIN, LOW (ICD-724.2)  ANEMIA, IRON DEFICIENCY, UNSPEC. (ICD-280.9)  Health Maintenance.......................................................   [facility]   Family History:  crohn`s in daughter, M-lupus, htn, asthma,  no Ca, DM, CAD    Social History:   lives with twin children and grandson. single; >20 pack year history, quit in 2005; no EtOH  Laid off  from works for [employer] Sept 2013           Objective:   Physical Exam  11/17/2019     223   12/30/2018  188  wt 166 Oct 12, 2009 >170 October 28, 2010 > 169 06/29/2011 >>171 08/24/2011 > 01/15/2012  171 > 04/24/2012 176 > 06/28/2012  175> 08/13/2012 > 08/30/2012  173 > 170 11/28/2012 > 01/29/2013  176 > 174 >173 06/16/2013 > 171 09/18/2013 >  12/19/2013 177 >179 04/13/2014>  04/23/2014 177 > 08/21/2014 184 >182 11/27/2014 > 05/12/2015 177  > 05/11/2016   177 > 10/27/2016  175 > 11/09/2016 182 > 01/09/2017 198 > 02/20/2017  206 >  07/17/2017   209 >  09/03/2017   210 > 09/28/2017  206  > 11/26/2017   209 > 01/10/2018  210> 02/19/2018  206> 05/21/2018  198 > 08/06/2018  197 > 08/21/2018  196 > 11/22/2018  187    amb obese bf nad  Vital signs reviewed - Note on arrival 02 sats  100% on 2lpm POC       Reports edentulous HEENT : pt wearing mask not removed for exam due to covid - 19 concerns.    NECK :  without JVD/Nodes/TM/ nl carotid upstrokes bilaterally   LUNGS:  no acc muscle use,  Mild barrel  contour chest wall with bilateral  Distant bs s audible wheeze and  without cough on insp or exp maneuvers  and mild  Hyperresonant  to  percussion bilaterally     CV:  RRR  no s3 or murmur or increase in P2, and no edema   ABD:  soft and nontender with pos end  insp Hoover's  in the supine position. No bruits or organomegaly appreciated, bowel sounds nl  MS:   Nl gait/  ext warm without deformities, calf tenderness, cyanosis or clubbing No obvious joint restrictions   SKIN: warm and dry without lesions    NEURO:  alert, approp, nl sensorium with  no motor or cerebellar deficits apparent.            Labs ordered/ reviewed:      Chemistry      Component Value Date/Time   NA 142 11/17/2019 1058   NA 146 (H) 01/10/2019 1220   K 3.8 11/17/2019 1058   CL 98 11/17/2019 1058   CO2 37 (H) 11/17/2019 1058   BUN 16 11/17/2019 1058   BUN 21 01/10/2019 1220   CREATININE 0.98 11/17/2019 1058   CREATININE 0.64 02/04/2015 1619      Component Value Date/Time   CALCIUM 9.7 11/17/2019 1058   ALKPHOS 78 11/17/2019 1058   AST 15 11/17/2019 1058   ALT 11 11/17/2019 1058   BILITOT 0.3 11/17/2019 1058   BILITOT 0.4 02/05/2017 1530                     Lab Results  Component Value Date   ESRSEDRATE 48 (H) 11/17/2019   ESRSEDRATE 113 (H)  11/22/2018   ESRSEDRATE 80 (H) 08/21/2018                  Assessment:

## 2019-11-17 NOTE — Patient Instructions (Addendum)
We will certify you for 02 today   Add pepcid 20 mg at bedtime to see if helps the am cough   If cough or breathing worse, go to 10 mg per day dose until better then resume previous dose   Please remember to go to the lab and x-ray department   for your tests - we will call you with the results when they are available.     Please schedule a follow up visit in 6 months but call sooner if needed  Late add:  No need for 02 at rest if sats >90%

## 2019-11-18 NOTE — Progress Notes (Signed)
Spoke with pt and notified of results per Dr. Wert. Pt verbalized understanding and denied any questions. 

## 2019-11-19 ENCOUNTER — Encounter: Payer: Self-pay | Admitting: Internal Medicine

## 2019-11-19 NOTE — Assessment & Plan Note (Signed)
Quit smoking 2005  - PFT's 06/26/08 FEV1 1.09 ratio  - PFTs 03/02/10 FEV1 1.06 ratio 34    PFT's 08/01/2011 FEV1 1.6 (42%)  41 ratio  DLCO 55 corrects 77 - Spiriva trial March 02, 2010 > ? better but not worse off it 04/2011 - 05/12/2015 p extensive coaching HFA effectiveness =    90%  - Referred to Rehab 08/21/2014 > could not arrange due to schedule  -med calendar 06/16/2013 > did not bring to office as requested 12/19/13 or 04/23/14  Or 08/21/2014  - 11/09/2016    changed symb to bevespi since taking chronic pred for pf    - 02/20/2017 decreased pred to 5 mg daily > weaned to 2.5 mg qd as of 07/17/2017  - started rehab mid Aug 2018  - 07/17/2017 changed to pred 2.5 even days > did not tolerate so changed  Around  07/28/17 back to 5 mg qod  - Spirometry 09/03/2017  FEV1 0.52 (23%)  Ratio 40 with typical curvature   - PFT's  02/19/2018  FEV1 0.65 (29 % ) ratio 43  p 1 % improvement from saba p stiolto/ pred 5 mg  prior to study with DLCO  39 % corrects to 56  % for alv volume - 12/30/2018  After extensive coaching inhaler device,  effectiveness =    95%   With smi > continue stiolto     Group D in terms of symptom/risk and laba/lama/ICS  therefore appropriate rx at this point >>>  Continue stiolto plus low dose systemic steroids which cover the sarcoid so need for ics   Pt informed of the seriousness of COVID 19 infection as a direct risk to lung health  and safey and to close contacts and should continue to wear a facemask in public and minimize exposure to public locations but especially avoid any area or activity where non-close contacts are not observing distancing or wearing an appropriate face mask.  I strongly recommended vaccine when offered.

## 2019-11-19 NOTE — Assessment & Plan Note (Signed)
Positive transbronchial biopsy 02/19/91 by Dr. Unice Cobble  -Daily prednisone since 2007 with ? adrenal insufficiency iatrogenic > off completely Feb 2012 with no recurrence - PFT's 06/26/08 FEV1  1.09 ratio  - PFTs  03/02/10 FEV1  1.06 (42%) ratio 34 and no better p B2,  DLC0 48% corrects to 64% - PFT's 08/01/2011 FEV1 1.6 (42%)  41 ratio  DLCO 55 corrects 77 - PFTs 10/10/16    FEV1  0.87(38)ratio 41 and DLCO 36/39c corrects to 51 for alv vol > placed back on daily pred  - 02/20/2017 decreased pred to 5 mg daily > to 2.75m daily as of 07/17/2017  - 07/17/2017 try 2.5 mg qod > 09/28/2017 changed to 20 mg until better and new floor of 5 mg daily plus consider plaquenil due to new skin involvement on face  - opth eval neg 11/10/17 - Start plaquenil 200 mg daily 11/26/2017  - 01/10/2018 ESR up to 97 with pred taper so rec bid plaquenil > could only tol 200 mg daily  - 02/19/2018 ESR = 94 on 563mdaily  - 05/21/2018 referred to dermatology  - 08/21/2018   ESR = 80  On prednisone 10 mg/ plaquenil 200 mg daily > tapered to floor of 5 mg daily  - 11/22/2018  ESR  = 113 on pred 5 mg and plaq 200 mg with calcium 12.1 so rec pred 20 mg ub then new floor of 10 mg daily  - 02/24/2019 clinically improved and doing fine on Plaquenil 200 plus pred  10 mg daily so rec rechalleng with slow taper to 5 mg daily    The goal with a chronic steroid dependent illness is always arriving at the lowest effective dose that controls the disease/symptoms and not accepting a set "formula" which is based on statistics or guidelines that don't always take into account patient  variability or the natural hx of the dz in every individual patient, which may well vary over time.  For now therefore I recommend the patient maintain  10 mg ceiling and 5 mg floor as calcium is fine, esr is down nicely on 5 mg daily

## 2019-11-19 NOTE — Assessment & Plan Note (Addendum)
New start 09/2016 at admit see records - 10/18/16 Patient Saturations on Room Air at Rest = 92% Patient Saturations on Room Air while Ambulating = 84% Patient Saturations on 2 Liters of oxygen while Ambulating = 90% - HCO3  11/09/2016 = 39  - HCO3   03/16/17        = 34  - HC03    01/10/2018  = 40  - HC03  11/22/2018     = 38  - HC03  12/20/2018       = 26  - HC03  11/17/2019       = 37   11/17/2019 Patient Saturations on Room Air at Rest = 97%  Patient Saturations on Room Air while Ambulating = 79%  Patient Saturations on 2 Liters of oxygen while Ambulating = 90%    As of  11/17/2019  rec 2lpm sleeping  and titrate with ambulation to maintain > 90%   Advised: Make sure you check your oxygen saturations at highest level of activity to be sure it stays over 90% and adjust upward to maintain this level if needed but remember to turn it back to previous settings when you stop (to conserve your supply).           Each maintenance medication was reviewed in detail including emphasizing most importantly the difference between maintenance and prns and under what circumstances the prns are to be triggered using an action plan format that is not reflected in the computer generated alphabetically organized AVS which I have not found useful in most complex patients, especially with respiratory illnesses  Total time for H and P, chart review, counseling, observing rest/ex sats/ and generating AVS / charting =  30 min

## 2019-11-28 ENCOUNTER — Ambulatory Visit: Payer: BLUE CROSS/BLUE SHIELD | Admitting: Family Medicine

## 2019-11-28 NOTE — Progress Notes (Deleted)
   Subjective:    Patient ID: [name], female    DOB: [birth year], 59 y.o.   MRN: [id number]   CC:  HPI: Low back pain and leg pain Seen by PCP on 9/4 for chronic back and leg pain. Was advised to continue PT and take tylenol 1000mg  bid as well as meloxican PRN.   Smoking status reviewed  Review of Systems   Objective:  There were no vitals taken for this visit. Vitals and nursing note reviewed  General: well nourished, in no acute distress HEENT: normocephalic, TM's visualized bilaterally, no scleral icterus or conjunctival pallor, no nasal discharge, moist mucous membranes, good dentition without erythema or discharge noted in posterior oropharynx Neck: supple, non-tender, without lymphadenopathy Cardiac: RRR, clear S1 and S2, no murmurs, rubs, or gallops Respiratory: clear to auscultation bilaterally, no increased work of breathing Abdomen: soft, nontender, nondistended, no masses or organomegaly. Bowel sounds present Extremities: no edema or cyanosis. Warm, well perfused. 2+ radial and PT pulses bilaterally Skin: warm and dry, no rashes noted Neuro: alert and oriented, no focal deficits   Assessment & Plan:    No problem-specific Assessment & Plan notes found for this encounter.    No follow-ups on file.   Caroline More, DO, PGY-3

## 2019-12-05 ENCOUNTER — Ambulatory Visit: Payer: BLUE CROSS/BLUE SHIELD | Admitting: Family Medicine

## 2019-12-08 ENCOUNTER — Other Ambulatory Visit: Payer: Self-pay

## 2019-12-08 ENCOUNTER — Ambulatory Visit (INDEPENDENT_AMBULATORY_CARE_PROVIDER_SITE_OTHER): Payer: Medicare PPO | Admitting: Family Medicine

## 2019-12-08 ENCOUNTER — Encounter: Payer: Self-pay | Admitting: Family Medicine

## 2019-12-08 VITALS — BP 122/78 | HR 69 | Wt 224.4 lb

## 2019-12-08 DIAGNOSIS — M545 Low back pain, unspecified: Secondary | ICD-10-CM

## 2019-12-08 LAB — POCT URINALYSIS DIP (MANUAL ENTRY)
Bilirubin, UA: NEGATIVE
Glucose, UA: NEGATIVE mg/dL
Ketones, POC UA: NEGATIVE mg/dL
Leukocytes, UA: NEGATIVE
Nitrite, UA: NEGATIVE
Protein Ur, POC: NEGATIVE mg/dL
Spec Grav, UA: 1.015 (ref 1.010–1.025)
Urobilinogen, UA: 0.2 E.U./dL
pH, UA: 8 (ref 5.0–8.0)

## 2019-12-08 NOTE — Assessment & Plan Note (Signed)
Appears to be acute on chronic.  Since patient has not had imaging in several months, will obtain lumbar x-rays.  She is at higher risk of fracture given her chronic steroid use, but there is no evidence of tenderness to palpation of the spine.  Advised supportive care including Voltaren gel, Tylenol, and heating pad.  Encouraged patient to continue with her exercises at home.  I will be in touch with her regarding the results of her x-ray.

## 2019-12-08 NOTE — Patient Instructions (Signed)
It was nice meeting you today Ms. Reinholtz!  I am ordering x-rays of your back today and I will let you know what they show once they return.  I would recommend you taking 1000 mg of Tylenol up to 4 times per day for your pain and using Voltaren gel as well as a heating pad.  This pain should continue to improve with time, especially if you continue doing the exercises that you were given.  Please let me know if you do not see any improvement after a few weeks and we will take further steps.  If you have any questions or concerns, please feel free to call the clinic.   Be well,  Dr. Shan Levans

## 2019-12-08 NOTE — Progress Notes (Signed)
   Subjective:    [name] - 59 y.o. female MRN [id number]  Date of birth: 05/01/1961  CC:  [name] is here for back pain.  HPI: BACK PAIN  Back pain began one month ago. Pain is described as aching, throbbing, mostly on L lower back. Patient has tried meloxicam (not helpful), exercises (minimally helpful). Pain radiates down both legs. History of trauma or injury: no Patient believes might be causing their pain: perhaps impingement of sciatic nerve  Prior history of similar pain: no History of cancer: breast cancer in 2016, mammograms have been good since then Weak immune system:  Yes, iatrogenic History of IV drug use: no History of steroid use: yes, prednisone 5 mg daily  Symptoms Incontinence of bowel or bladder: has urinary incontinence as a chronic problem Numbness of leg: no Fever: no Rest or Night pain: yes Weight Loss: no Rash: no   ROS see HPI Smoking Status noted.   Health Maintenance:  There are no preventive care reminders to display for this patient.  -  reports that she quit smoking about 16 years ago. She has a 20.00 pack-year smoking history. She has never used smokeless tobacco. - Review of Systems: Per HPI. - Past Medical History: Patient Active Problem List   Diagnosis Date Noted  . Acute right-sided low back pain without sciatica 03/28/2019  . Benign neoplasm of ascending colon   . Acute GI bleeding 02/19/2019  . Anemia 01/10/2019  . Acute on chronic respiratory failure ([place]) 12/17/2018  . Multifocal pneumonia 12/17/2018  . Acute exacerbation of chronic obstructive pulmonary disease (COPD) ([place]) 12/17/2018  . Chronic cough 11/26/2017  . Obesity (BMI 30-39.9) 01/10/2017  . Dyspnea on exertion 11/09/2016  . Chronic respiratory failure with hypoxia and hypercapnia ([place]) 10/29/2016  . Depression with anxiety 08/07/2016  . Breast cancer of upper-outer quadrant of right female breast ([place]) 08/04/2015  . GERD (gastroesophageal reflux  disease) 12/21/2012  . Hyperlipidemia 12/21/2012  . SICKLE-CELL TRAIT 04/15/2008  . Sarcoidosis ([place]) 01/10/2007  . HYPERTENSION, BENIGN SYSTEMIC 01/10/2007  . 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 BACK PAIN, LOW 01/10/2007   - Medications: reviewed and updated   Objective:   Physical Exam BP 122/78   Pulse 69   Wt 224 lb 6.4 oz (101.8 kg)   SpO2 97%   BMI 37.34 kg/m  Gen: NAD, alert, cooperative with exam, well-appearing   Back: Normal skin, Spine with normal alignment and no deformity.  No tenderness to vertebral process palpation.  Paraspinous muscles are tender to palpation especially in the left lumbosacral area.  Range of motion is full at lumbar sacral regions.  Straight leg test positive on the left with pain radiating to the gluteal region.  Neurovascularly intact.        Assessment & Plan:   BACK PAIN, LOW Appears to be acute on chronic.  Since patient has not had imaging in several months, will obtain lumbar x-rays.  She is at higher risk of fracture given her chronic steroid use, but there is no evidence of tenderness to palpation of the spine.  Advised supportive care including Voltaren gel, Tylenol, and heating pad.  Encouraged patient to continue with her exercises at home.  I will be in touch with her regarding the results of her x-ray.    Maia Breslow, M.D. 12/09/2019, 7:29 AM PGY-3, [place]

## 2019-12-30 ENCOUNTER — Other Ambulatory Visit: Payer: Self-pay | Admitting: Family Medicine

## 2019-12-30 ENCOUNTER — Other Ambulatory Visit: Payer: Self-pay | Admitting: Internal Medicine

## 2019-12-30 NOTE — Telephone Encounter (Signed)
Dr. Melvyn Novas, please advise if you are okay with [place] refilling med for pt or if you want it deferred to pt's PCP.

## 2019-12-31 ENCOUNTER — Encounter: Payer: Self-pay | Admitting: Family Medicine

## 2020-01-16 ENCOUNTER — Telehealth: Payer: Self-pay | Admitting: Internal Medicine

## 2020-01-16 NOTE — Telephone Encounter (Signed)
[name], please advise if you have received CMN on pt?

## 2020-01-19 NOTE — Telephone Encounter (Signed)
I don't have anything on this patient. I have sent a message to Wells Guiles with [employer] to see if she can have the CMN refaxed to me

## 2020-01-27 ENCOUNTER — Ambulatory Visit: Payer: Medicare PPO | Admitting: Family Medicine

## 2020-02-18 ENCOUNTER — Emergency Department (HOSPITAL_COMMUNITY)
Admission: EM | Admit: 2020-02-18 | Discharge: 2020-02-18 | Disposition: A | Discharge status: Home / Self Care | Payer: Medicare PPO | Attending: Emergency Medicine | Admitting: Emergency Medicine

## 2020-02-18 ENCOUNTER — Emergency Department (HOSPITAL_COMMUNITY): Payer: Medicare PPO

## 2020-02-18 ENCOUNTER — Encounter (HOSPITAL_COMMUNITY): Payer: Self-pay

## 2020-02-18 DIAGNOSIS — M5442 Lumbago with sciatica, left side: Secondary | ICD-10-CM

## 2020-02-18 DIAGNOSIS — M545 Low back pain: Secondary | ICD-10-CM | POA: Diagnosis not present

## 2020-02-18 DIAGNOSIS — Z79899 Other long term (current) drug therapy: Secondary | ICD-10-CM | POA: Insufficient documentation

## 2020-02-18 DIAGNOSIS — M5489 Other dorsalgia: Secondary | ICD-10-CM | POA: Diagnosis not present

## 2020-02-18 DIAGNOSIS — R52 Pain, unspecified: Secondary | ICD-10-CM | POA: Diagnosis not present

## 2020-02-18 DIAGNOSIS — Z87891 Personal history of nicotine dependence: Secondary | ICD-10-CM | POA: Diagnosis not present

## 2020-02-18 DIAGNOSIS — M5441 Lumbago with sciatica, right side: Secondary | ICD-10-CM | POA: Diagnosis not present

## 2020-02-18 DIAGNOSIS — Z7982 Long term (current) use of aspirin: Secondary | ICD-10-CM | POA: Diagnosis not present

## 2020-02-18 DIAGNOSIS — J449 Chronic obstructive pulmonary disease, unspecified: Secondary | ICD-10-CM | POA: Insufficient documentation

## 2020-02-18 DIAGNOSIS — I10 Essential (primary) hypertension: Secondary | ICD-10-CM | POA: Diagnosis not present

## 2020-02-18 MED ORDER — METHOCARBAMOL 500 MG PO TABS: 500.0000 mg | ORAL_TABLET | Freq: Two times a day (BID) | ORAL | 0 refills | Status: DC

## 2020-02-18 MED ORDER — LIDOCAINE 5 % EX PTCH
2.0000 | MEDICATED_PATCH | CUTANEOUS | Status: DC
  Administered 2020-02-18: 2 via TRANSDERMAL
  Filled 2020-02-18: qty 2

## 2020-02-18 NOTE — ED Triage Notes (Addendum)
Patient arrived via [facility] from home.   C/O lower back pain and bilateral leg pain X1 week ago.  Ambulatory on scene.    BP-150/84 HR-80 RR-20 98% on 2 liters 02 and always on 2 liters at home

## 2020-02-18 NOTE — ED Provider Notes (Signed)
[facility] DEPT Provider Note   CSN: [id number] Arrival date & time: 02/18/20  1541     History Chief Complaint  Patient presents with  . Back Pain  . Leg Pain    NYYA DUSKIN is a 59 y.o. female.  59 year old female brought in by EMS from home for complaint of low back pain x3-1/2 months.  Patient states pain began in her left and right low back areas, radiates down posterior thighs and not to posterior ankles.  Pain is worse with walking, movement, palpation.  Patient went to her PCP at the end of February for same, was given Voltaren gel which is not helping.  Patient is also taking Tylenol without relief.  Patient's PCP ordered a lumbar x-ray which patient did not get done as she did not know this was ordered. Denies falls, injuries, no history of IVDA, reports long term steroid use, denies loss of bowel control (chronic urinary incontinence). Denies groin numbness, leg weakness.         Past Medical History:  Diagnosis Date  . Acute on chronic respiratory failure ([place]) 10/09/2016  . Allergic rhinitis   . Breast cancer ([place]) 2016   right breast  . COPD, mild ([place]) FOLLOWED BY DR Melvyn Novas  . GERD (gastroesophageal reflux disease)   . HTN (hypertension)   . Hypertension   . Iron deficiency anemia   . Long-term current use of steroids SYMBICORT INHALER  . 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 Overweight (BMI 25.0-29.9) 05/11/2016  . Personal history of radiation therapy 2016  . Sarcoidosis STABLE PER CXR JUNE 2013  . Shortness of breath   . Sickle cell trait [facility])     Patient Active Problem List   Diagnosis Date Noted  . Acute right-sided low back pain without sciatica 03/28/2019  . Benign neoplasm of ascending colon   . Anemia 01/10/2019  . Chronic cough 11/26/2017  . Obesity (BMI 30-39.9) 01/10/2017  . Dyspnea on exertion 11/09/2016  . Chronic respiratory failure with hypoxia and hypercapnia ([place]) 10/29/2016  . Depression with anxiety 08/07/2016  .  Hot flashes 02/01/2016  . Breast cancer of upper-outer quadrant of right female breast ([place]) 08/04/2015  . GERD (gastroesophageal reflux disease) 12/21/2012  . Hyperlipidemia 12/21/2012  . SICKLE-CELL TRAIT 04/15/2008  . Sarcoidosis ([place]) 01/10/2007  . HYPERTENSION, BENIGN SYSTEMIC 01/10/2007  . 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 BACK PAIN, LOW 01/10/2007    Past Surgical History:  Procedure Laterality Date  . ADJUSTABLE SUTURE MANIPULATION  05/22/2012   Procedure: ADJUSTABLE SUTURE MANIPULATION;  Surgeon: Dara Hoyer, MD;  [facility];  Service: Ophthalmology;  Laterality: Right;  . BREAST LUMPECTOMY Right 2016  . CESAREAN SECTION  1986   W/ BILATERAL TUBAL LIGATION  . COLONOSCOPY WITH PROPOFOL N/A 02/21/2019   Procedure: COLONOSCOPY WITH PROPOFOL;  Surgeon: Irene Shipper, MD;  Location: [facility] END[facility]COPY;  Service: Endoscopy;  Laterality: N/A;  . EYE SURGERY    . MEDIAN RECTUS REPAIR  05/22/2012   Procedure: MEDIAN RECTUS REPAIR;  Surgeon: Dara Hoyer, MD;  [facility];  Service: Ophthalmology;  Laterality: Bilateral;  INFERIOR RECTUS RESECTION WITH ADJUSTIBLE SUTURES RIGHT EYE   . POLYPECTOMY  02/21/2019   Procedure: POLYPECTOMY;  Surgeon: Irene Shipper, MD;  Location: [facility];  Service: Endoscopy;;  . RADIOACTIVE SEED GUIDED PARTIAL MASTECTOMY WITH AXILLARY SENTINEL LYMPH NODE BIOPSY Right 08/18/2015   Procedure: RADIOACTIVE SEED GUIDED  PARTIAL MASTECTOMY WITH AXILLARY SENTINEL LYMPH NODE BIOPSY;  Surgeon: Stark Klein, MD;  Location: [facility];  Service: General;  Laterality: Right;  . TOTAL ROBOTIC ASSISTED LAPAROSCOPIC HYSTERECTOMY  12-30-2010   SYMPTOMATIC UTERINE FIBROIDS  . UPPER TEETH EXTRACTION'S  1992     OB History   No obstetric history on file.     Family History  Problem Relation Age of Onset  . Hyperlipidemia Mother   . Asthma Mother   . Lupus  Mother   . Breast cancer Mother 23  . Hypertension Father   . Hyperlipidemia Father   . Hypertension Sister   . Hypertension Brother   . Cancer Neg Hx   . Diabetes Neg Hx   . Coronary artery disease Neg Hx     Social History   Tobacco Use  . Smoking status: Former Smoker    Packs/day: 1.00    Years: 20.00    Pack years: 20.00    Quit date: 11/14/2003    Years since quitting: 16.2  . Smokeless tobacco: Never Used  Substance Use Topics  . Alcohol use: No  . Drug use: No    Home Medications Prior to Admission medications   Medication Sig Start Date End Date Taking? Authorizing Provider  albuterol (PROVENTIL) (2.5 MG/3ML) 0.083% nebulizer solution Take 3 mLs (2.5 mg total) by nebulization every 6 (six) hours as needed for wheezing or shortness of breath. 12/22/18  Yes Bonnielee Haff, MD  albuterol (VENTOLIN HFA) 108 (90 Base) MCG/ACT inhaler INHALE 2 PUFFS EVERY 6 HOURS AS NEEDED FOR WHEEZING OR SHORTNESS OF BREATH 09/23/19  Yes Tanda Rockers, MD  anastrozole (ARIMIDEX) 1 MG tablet Take 1 tablet (1 mg total) by mouth daily. 06/17/19  Yes Nicholas Lose, MD  aspirin EC 81 MG tablet Take 1 tablet (81 mg total) by mouth daily. Start 02/23/19 02/21/19  Yes Pokhrel, Laxman, MD  atorvastatin (LIPITOR) 20 MG tablet Take 1 tablet (20 mg total) by mouth daily. 06/04/19  Yes Benay Pike, MD  ferrous sulfate 325 (65 FE) MG tablet Take 1 tablet (325 mg total) by mouth daily with breakfast. Patient taking differently: Take 325 mg by mouth every other day.  02/21/19  Yes Pokhrel, Laxman, MD  fluticasone (FLONASE) 50 MCG/ACT nasal spray Place 2 sprays into both nostrils daily as needed for allergies. 06/03/19  Yes Tanda Rockers, MD  gabapentin (NEURONTIN) 300 MG capsule TAKE 2 CAPSULES (600 MG TOTAL) BY MOUTH AT BEDTIME. 12/31/19  Yes Benay Pike, MD  Guaifenesin [facility] MAXIMUM STRENGTH) 1200 MG TB12 Take 1,200 mg by mouth 2 (two) times daily as needed (chest congestion).    Yes [provider]  hydroxychloroquine (PLAQUENIL) 200 MG tablet TAKE 1 TABLET TWICE DAILY Patient taking differently: Take 200 mg by mouth daily.  10/24/19  Yes Benay Pike, MD  metoprolol tartrate (LOPRESSOR) 25 MG tablet Take 1 tablet (25 mg total) by mouth 2 (two) times daily. 06/04/19  Yes Benay Pike, MD  Multiple Vitamin (MULTIVITAMIN) capsule Take 1 capsule by mouth daily.   Yes [provider]  omeprazole (PRILOSEC) 20 MG capsule TAKE 1 CAPSULE (20 MG TOTAL) BY MOUTH DAILY. 10/24/19  Yes Benay Pike, MD  OXYGEN 2lpm with rest and 3lpm with exertion   Yes [provider]  predniSONE (DELTASONE) 10 MG tablet 1/2 to 1 daily as directed Patient taking differently: Take 5 mg by mouth daily.  06/03/19  Yes Tanda Rockers, MD  sertraline (  ZOLOFT) 50 MG tablet Take 1 tablet (50 mg total) by mouth daily. 06/04/19  Yes Benay Pike, MD  Tiotropium Bromide-Olodaterol (STIOLTO RESPIMAT) 2.5-2.5 MCG/ACT AERS Inhale 2 puffs into the lungs daily. 11/17/19  Yes Tanda Rockers, MD  triamterene-hydrochlorothiazide (MAXZIDE-25) 37.5-25 MG tablet TAKE 1 TABLET EVERY DAY 12/30/19  Yes Tanda Rockers, MD  azithromycin [facility]) 250 MG tablet Take two today and then one daily until finished Patient not taking: Reported on 02/18/2020 03/04/19   Tanda Rockers, MD  meloxicam (MOBIC) 7.5 MG tablet Take 1 tablet (7.5 mg total) by mouth daily. Patient not taking: Reported on 11/17/2019 07/18/19   Benay Pike, MD  methocarbamol (ROBAXIN) 500 MG tablet Take 1 tablet (500 mg total) by mouth 2 (two) times daily. 02/18/20   Tacy Learn, PA-C  senna (SENOKOT) 8.6 MG TABS tablet Take 1 tablet (8.6 mg total) by mouth 2 (two) times daily. Patient not taking: Reported on 02/18/2020 12/22/18   Bonnielee Haff, MD    Allergies    Codeine  Review of Systems   Review of Systems  Constitutional: Negative for fever.  Gastrointestinal: Negative for abdominal pain, constipation and diarrhea.    Genitourinary: Negative for dysuria and urgency.  Musculoskeletal: Positive for back pain and gait problem.  Skin: Negative for wound.  Neurological: Negative for weakness and numbness.    Physical Exam Updated Vital Signs BP 125/72 (BP Location: Left Arm)   Pulse 73   Temp 99 F (37.2 C) (Oral)   Resp 18   SpO2 98%   Physical Exam Vitals and nursing note reviewed.  Constitutional:      General: She is not in acute distress.    Appearance: She is well-developed. She is not diaphoretic.  HENT:     Head: Normocephalic and atraumatic.  Cardiovascular:     Pulses: Normal pulses.  Pulmonary:     Effort: Pulmonary effort is normal.  Musculoskeletal:        General: Tenderness present. No swelling, deformity or signs of injury.     Thoracic back: No tenderness or bony tenderness.     Lumbar back: Tenderness present. No bony tenderness. Positive right straight leg raise test and positive left straight leg raise test.       Back:     Right lower leg: No edema.     Left lower leg: No edema.  Skin:    General: Skin is warm and dry.  Neurological:     Mental Status: She is alert and oriented to person, place, and time.     Sensory: No sensory deficit.     Motor: No weakness.     Gait: Gait normal.     Deep Tendon Reflexes: Reflexes normal. Babinski sign absent on the right side. Babinski sign absent on the left side.     Reflex Scores:      Patellar reflexes are 1+ on the right side and 1+ on the left side.      Achilles reflexes are 1+ on the right side and 1+ on the left side. Psychiatric:        Behavior: Behavior normal.     ED Results / Procedures / Treatments   Labs (all labs ordered are listed, but only abnormal results are displayed) Labs Reviewed - No data to display  EKG None  Radiology DG Lumbar Spine Complete  Result Date: 02/18/2020 CLINICAL DATA:  59 year old female with low back pain. EXAM: LUMBAR SPINE - COMPLETE 4+ VIEW COMPARISON:  CT abdomen pelvis  dated 02/19/2019. FINDINGS: Five lumbar type vertebra. There is no acute fracture or subluxation of the lumbar spine. Mild degenerative changes and bone spurring. The visualized posterior elements are intact. Mild degenerative changes of the side joints bilaterally. Atherosclerotic calcification of the aorta. The soft tissues are grossly unremarkable. IMPRESSION: No acute/traumatic lumbar spine pathology. Electronically Signed   By: Anner Crete M.D.   On: 02/18/2020 20:11    Procedures Procedures (including critical care time)  Medications Ordered in ED Medications  lidocaine (LIDODERM) 5 % 2 patch (has no administration in time range)    ED Course  I have reviewed the triage vital signs and the nursing notes.  Pertinent labs & imaging results that were available during my care of the patient were reviewed by me and considered in my medical decision making (see chart for details).  Clinical Course as of Feb 18 2040  Wed Feb 17, 4517  3157 59 year old female with complaint of low back pain radiating down both legs. Pain started almost 4 months ago, has been doing home exercises as well as taking Tylenol and applying Voltaren gel without improvement. Patient did go to her PCP at the end of January, did not follow-up for her x-rays. On exam has tenderness left and right lower back at SI joints. Reflexes symmetric, leg strength equal. X-ray lumbar spine unremarkable. Patient was given Lidoderm patch. Will discharge with prescription for Robaxin. Recommend that she follow-up with her PCP as scheduled next week to discuss further treatment.   [LM]    Clinical Course User Index [LM] Roque Lias   MDM Rules/Calculators/A&P                      Final Clinical Impression(s) / ED Diagnoses Final diagnoses:  Acute bilateral low back pain with bilateral sciatica    Rx / DC Orders ED Discharge Orders         Ordered    methocarbamol (ROBAXIN) 500 MG tablet  2 times daily      02/18/20 2015           Tacy Learn, PA-C 02/18/20 2041    Valarie Merino, MD 02/19/20 0040

## 2020-02-18 NOTE — Discharge Instructions (Addendum)
Take Robaxin twice daily as prescribed. Apply warm compresses to your low back followed by your gentle stretching exercises. Follow-up with your primary care provider as scheduled next Tuesday. Your x-rays today are normal.  As discussed, you may need additional physical therapy or referral to a spine doctor as you have not been improving with conservative treatment (medication and exercises).

## 2020-02-18 NOTE — ED Notes (Signed)
Patient calls out to state her 02 tank is running out. New o2 tank supplied for patient to use and set on 2 liters.

## 2020-02-19 ENCOUNTER — Ambulatory Visit: Payer: Medicare PPO

## 2020-02-23 DIAGNOSIS — J449 Chronic obstructive pulmonary disease, unspecified: Secondary | ICD-10-CM | POA: Diagnosis not present

## 2020-02-23 DIAGNOSIS — D869 Sarcoidosis, unspecified: Secondary | ICD-10-CM | POA: Diagnosis not present

## 2020-02-23 DIAGNOSIS — J9612 Chronic respiratory failure with hypercapnia: Secondary | ICD-10-CM | POA: Diagnosis not present

## 2020-02-24 ENCOUNTER — Ambulatory Visit: Payer: Medicare PPO | Admitting: Family Medicine

## 2020-02-24 NOTE — Progress Notes (Deleted)
    SUBJECTIVE:   CHIEF COMPLAINT / HPI:   Back pain: pt went to the ED on 4/7.  Was given robaxin bid.  Does not appear to have seen sports medicine ***.    Sarcoidosis:   Hyperlipidemia: ***  PERTINENT  PMH / PSH: ***  OBJECTIVE:   There were no vitals taken for this visit.  ***  ASSESSMENT/PLAN:   No problem-specific Assessment & Plan notes found for this encounter.     Benay Pike, MD [place]

## 2020-03-05 ENCOUNTER — Other Ambulatory Visit: Payer: Self-pay | Admitting: Internal Medicine

## 2020-03-07 IMAGING — MG MM DIGITAL DIAGNOSTIC BILAT W/ TOMO W/ CAD
6 of 10 series · 6 of 26 positions shown · non-contrast
Comparison: Previous exam(s).

CLINICAL DATA: History of treated right breast cancer, status post
lumpectomy in 7252.

EXAM:
DIGITAL DIAGNOSTIC BILATERAL MAMMOGRAM WITH CAD AND TOMO

[R XCCL (1 of 2)]
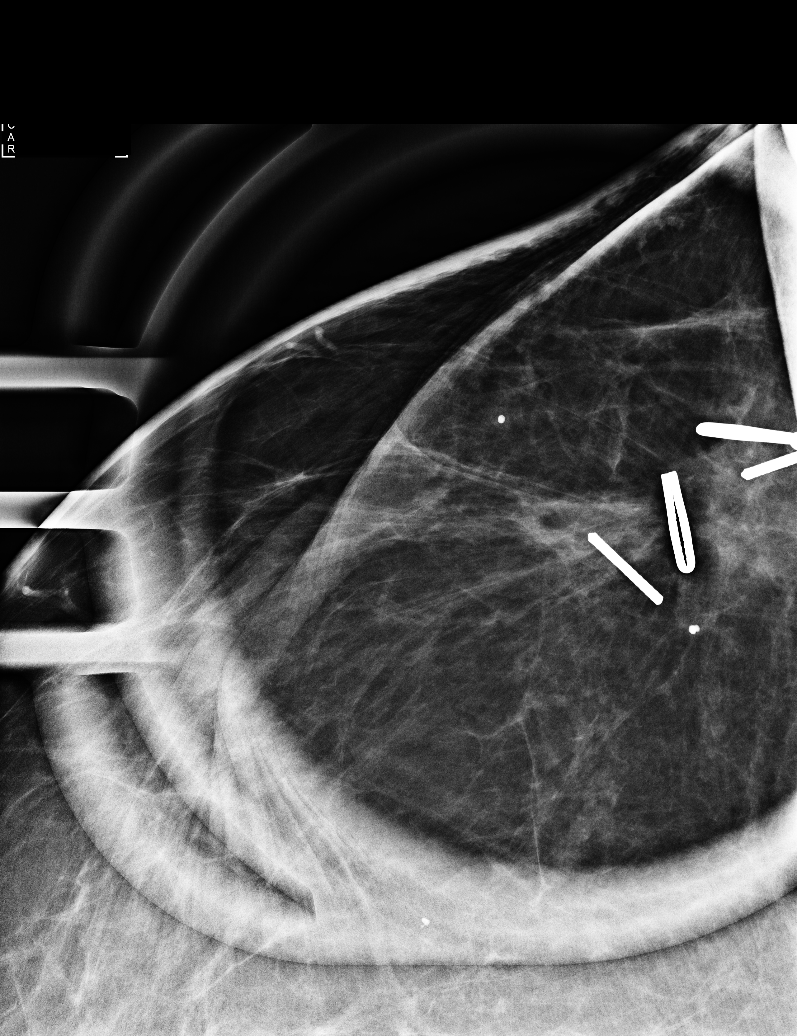

[R XCCL (2 of 2)]
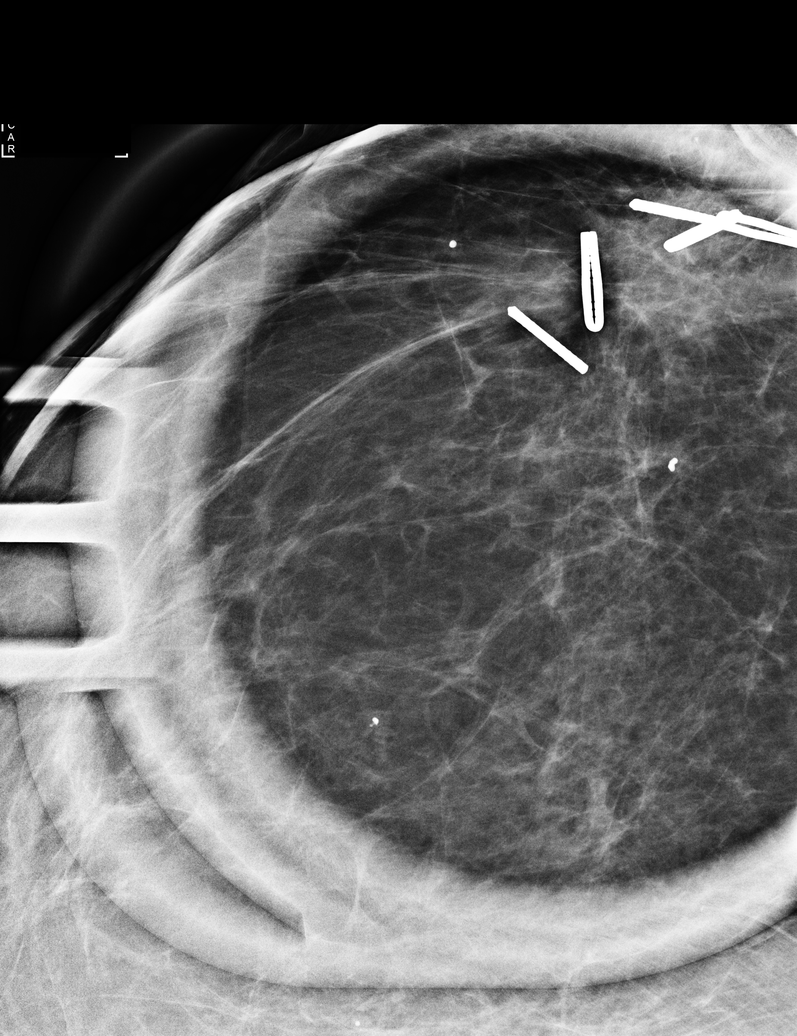

[R MLO synth-2D]
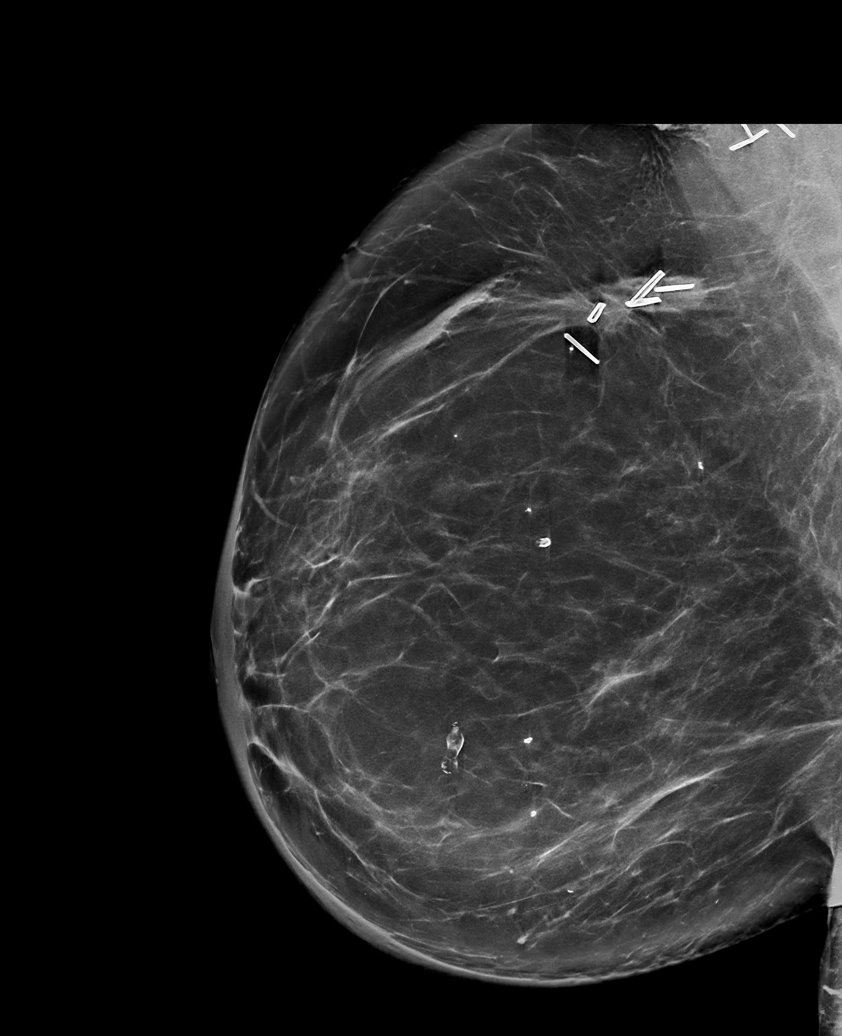

[L CC synth-2D]
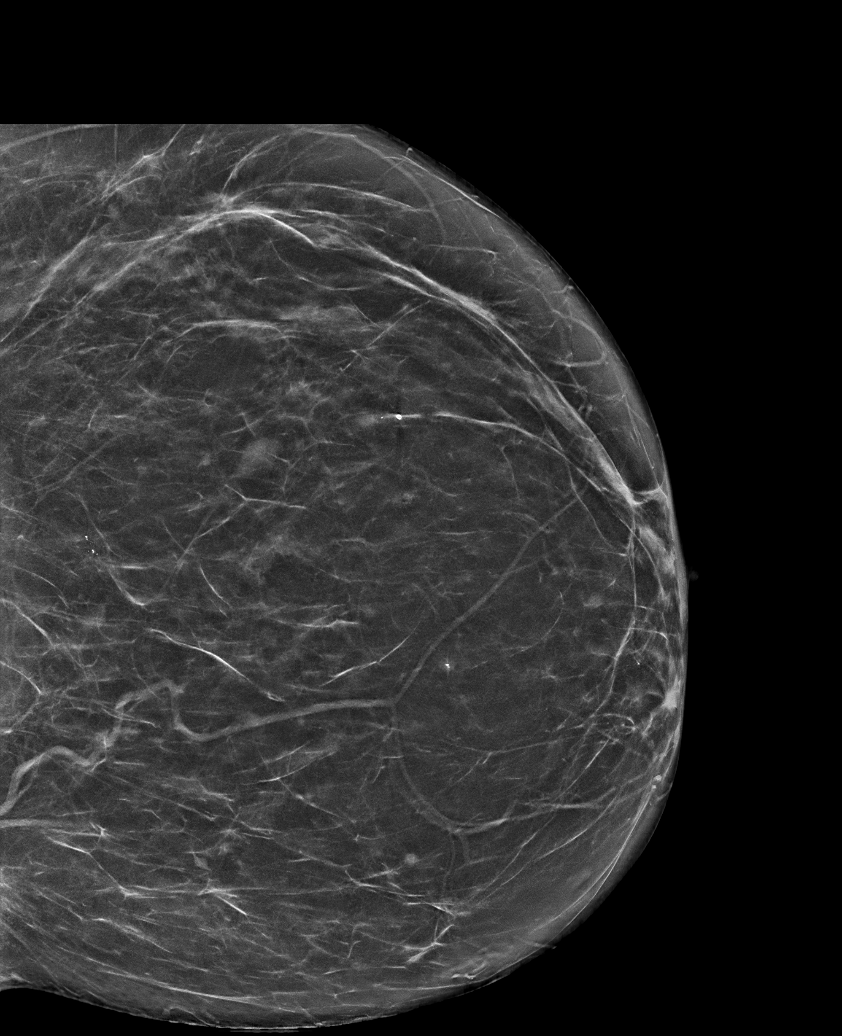

[R CC synth-2D]
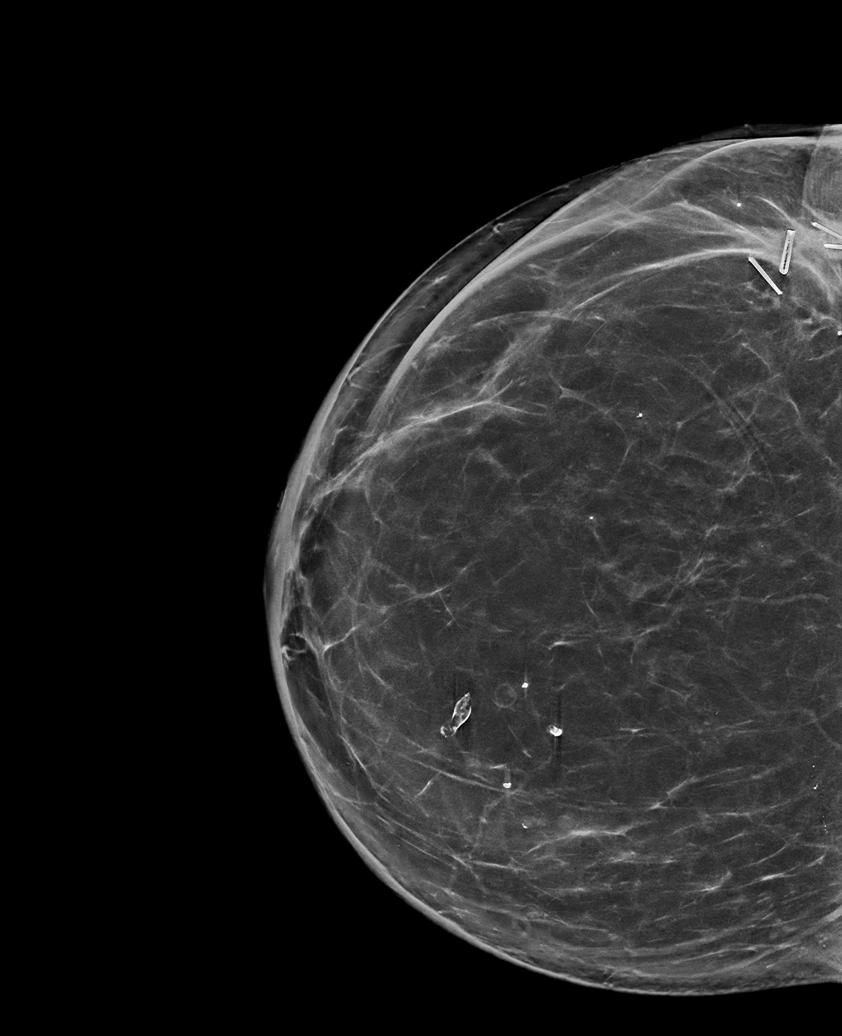

[L MLO synth-2D]
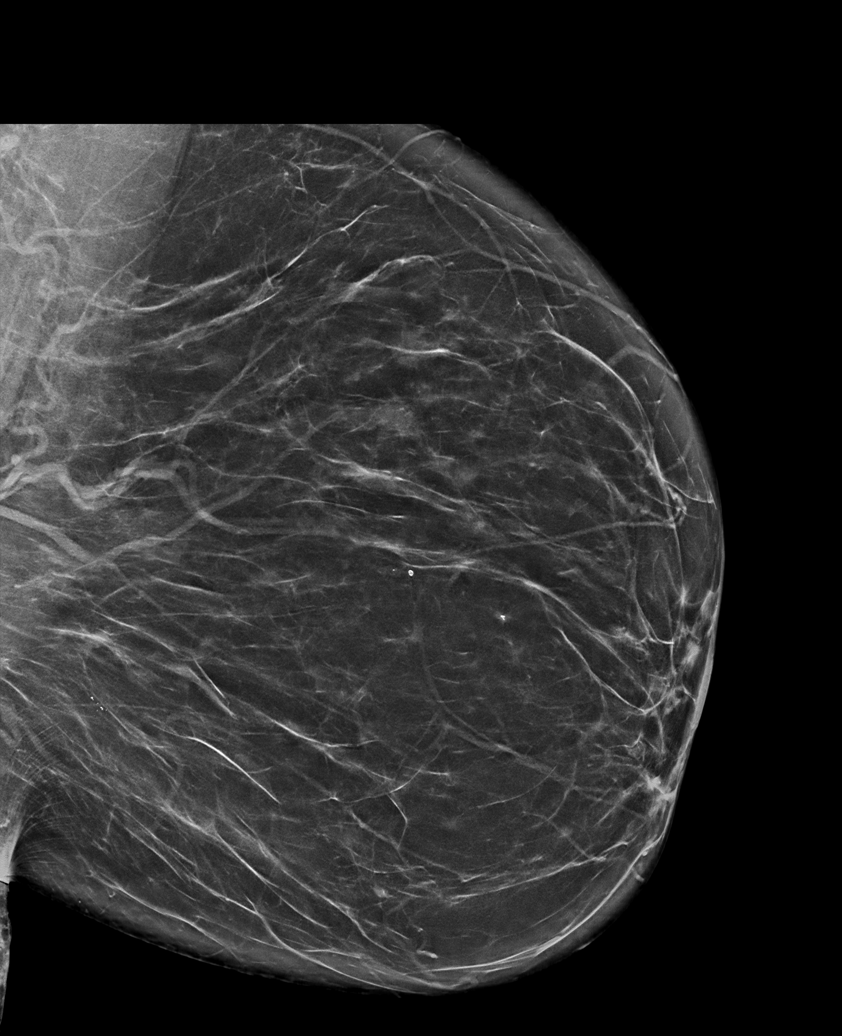

[6 of 26 positions shown; findings below may reference images not displayed]

ACR Breast Density Category b: There are scattered areas of
fibroglandular density.
FINDINGS: Mammographically, there are no suspicious masses, areas of
nonsurgical architectural distortion or new clustered
calcifications. Stable posttreatment changes in right.

Mammographic images were processed with CAD.
IMPRESSION: No mammographic evidence of malignancy in either breast, status post
right lumpectomy.

RECOMMENDATION:
Diagnostic mammogram is suggested in 1 year. (Code:[id number]-[id number])

I have discussed the findings and recommendations with the patient.
If applicable, a reminder letter will be sent to the patient
regarding the next appointment.

BI-RADS CATEGORY  2: Benign.

## 2020-03-10 ENCOUNTER — Other Ambulatory Visit: Payer: Self-pay | Admitting: Family Medicine

## 2020-03-10 DIAGNOSIS — K219 Gastro-esophageal reflux disease without esophagitis: Secondary | ICD-10-CM

## 2020-03-10 DIAGNOSIS — D869 Sarcoidosis, unspecified: Secondary | ICD-10-CM

## 2020-03-31 NOTE — Progress Notes (Signed)
Patient Care Team: Benay Pike, MD as PCP - Jeanine Luz, MD as Consulting Physician (Hematology and Oncology) Eppie Gibson, MD as Attending Physician (Radiation Oncology) Stark Klein, MD as Consulting Physician (General Surgery)  DIAGNOSIS:    ICD-10-CM   1. Malignant neoplasm of upper-outer quadrant of right breast in female, estrogen receptor positive ([place])  C50.411 gabapentin (NEURONTIN) 300 MG capsule   Z17.0 DG Bone Density    SUMMARY OF ONCOLOGIC HISTORY: Oncology History  Breast cancer of upper-outer quadrant of right female breast ([place])  07/08/2015 Mammogram   Right breast calcifications 11 mm span   08/04/2015 Initial Diagnosis   Biopsy right breast: Invasive ductal carcinoma with DCIS with calcifications, lymphovascular invasion is identified, grade 2-3, ER 100%, PR 100% Ki-67 20%, HER-2 negative ratio 1.46   08/04/2015 Clinical Stage   Stage IA: T1b N0   08/18/2015 Definitive Surgery   Right  lumpectomy: invasive ductal carcinoma, grade 2, 0.6 cm, with DCIS high-grade, margins negative, 0/2 lymph nodes negative, ER 100%, PR positive, HER-2 negative, Ki-67 20%   08/18/2015 Pathologic Stage   Stage IA: T1b N0   08/18/2015 Oncotype testing   Insufficient tissue to perform   09/21/2015 - 10/20/2015 Radiation Therapy   Adjuvant radiation therapy: 1) Right Breast / 40.05 Gy in 15 fractions 2) Right breast boost / 10 Gy in 5 fractions   10/08/2015 - 10/11/2015 Hospital Admission   cellulitis of the right breast   11/16/2015 -  Anti-estrogen oral therapy   Anastrozole 1 mg daily once hot flashes better controlled. Planned duration of therapy 5 years.      CHIEF COMPLIANT: Follow-up of right breast cancer on anastrozole therapy  INTERVAL HISTORY: [name] is a 59 y.o. with above-mentioned history of right breast cancer treated with lumpectomy, radiation, and who is currently on anastrozole therapy. Mammogram on 08/18/19 showed no evidence of malignancy  bilaterally.  She had breast density category B.   She is seeing her PCP regularly.  She is having sciatic back pain that is being managed by [employer].  She has undergone xrays.    ALLERGIES:  is allergic to codeine.  MEDICATIONS:  Current Outpatient Medications  Medication Sig Dispense Refill  . albuterol (PROVENTIL) (2.5 MG/3ML) 0.083% nebulizer solution Take 3 mLs (2.5 mg total) by nebulization every 6 (six) hours as needed for wheezing or shortness of breath. 75 mL 1  . albuterol (VENTOLIN HFA) 108 (90 Base) MCG/ACT inhaler INHALE 2 PUFFS EVERY 6 HOURS AS NEEDED FOR WHEEZING OR SHORTNESS OF BREATH 54 g 0  . anastrozole (ARIMIDEX) 1 MG tablet Take 1 tablet (1 mg total) by mouth daily. 90 tablet 3  . aspirin EC 81 MG tablet Take 1 tablet (81 mg total) by mouth daily. Start 02/23/19    . atorvastatin (LIPITOR) 20 MG tablet Take 1 tablet (20 mg total) by mouth daily. 90 tablet 3  . ferrous sulfate 325 (65 FE) MG tablet Take 1 tablet (325 mg total) by mouth daily with breakfast. (Patient taking differently: Take 325 mg by mouth every other day. ) 90 tablet 0  . fluticasone (FLONASE) 50 MCG/ACT nasal spray Place 2 sprays into both nostrils daily as needed for allergies. 48 g 3  . gabapentin (NEURONTIN) 300 MG capsule Take 2 capsules (600 mg total) by mouth at bedtime. 180 capsule 3  . Guaifenesin (MUCINEX MAXIMUM STRENGTH) 1200 MG TB12 Take 1,200 mg by mouth 2 (two) times daily as needed (chest congestion).     Marland Kitchen  hydroxychloroquine (PLAQUENIL) 200 MG tablet Take 1 tablet (200 mg total) by mouth daily. 90 tablet 2  . meloxicam (MOBIC) 7.5 MG tablet Take 1 tablet (7.5 mg total) by mouth daily. (Patient not taking: Reported on 11/17/2019) 15 tablet 0  . methocarbamol (ROBAXIN) 500 MG tablet Take 1 tablet (500 mg total) by mouth 2 (two) times daily. (Patient not taking: Reported on 04/01/2020) 20 tablet 0  . metoprolol tartrate (LOPRESSOR) 25 MG tablet Take 1 tablet (25 mg total) by mouth 2 (two)  times daily. 60 tablet 11  . Multiple Vitamin (MULTIVITAMIN) capsule Take 1 capsule by mouth daily.    Marland Kitchen omeprazole (PRILOSEC) 20 MG capsule TAKE 1 CAPSULE (20 MG TOTAL) BY MOUTH DAILY. 90 capsule 1  . OXYGEN 2lpm with rest and 3lpm with exertion    . predniSONE (DELTASONE) 10 MG tablet 1/2 to 1 daily as directed (Patient taking differently: Take 5 mg by mouth daily. ) 90 tablet 3  . senna (SENOKOT) 8.6 MG TABS tablet Take 1 tablet (8.6 mg total) by mouth 2 (two) times daily. (Patient not taking: Reported on 02/18/2020) 120 each 0  . sertraline (ZOLOFT) 50 MG tablet Take 1 tablet (50 mg total) by mouth daily. 90 tablet 3  . Tiotropium Bromide-Olodaterol (STIOLTO RESPIMAT) 2.5-2.5 MCG/ACT AERS Inhale 2 puffs into the lungs daily. 12 g 11  . triamterene-hydrochlorothiazide (MAXZIDE-25) 37.5-25 MG tablet TAKE 1 TABLET EVERY DAY 90 tablet 3   No current facility-administered medications for this visit.    PHYSICAL EXAMINATION: ECOG PERFORMANCE STATUS: 0 - Asymptomatic  Vitals:   04/01/20 1529  BP: 134/67  Pulse: 76  Resp: 18  Temp: 97.6 F (36.4 C)  SpO2: 98%   Filed Weights   04/01/20 1529  Weight: 222 lb 1.6 oz (100.7 kg)  GENERAL: Patient is a well appearing female in no acute distress HEENT:  Sclerae anicteric.  Oropharynx clear and moist. No ulcerations or evidence of oropharyngeal candidiasis. Neck is supple.  NODES:  No cervical, supraclavicular, or axillary lymphadenopathy palpated.  LUNGS:  Clear to auscultation bilaterally.  No wheezes or rhonchi. HEART:  Regular rate and rhythm. No murmur appreciated. ABDOMEN:  Soft, nontender.  Positive, normoactive bowel sounds. No organomegaly palpated. MSK:  No focal spinal tenderness to palpation. Full range of motion bilaterally in the upper extremities. EXTREMITIES:  No peripheral edema.   SKIN:  Clear with no obvious rashes or skin changes. No nail dyscrasia. NEURO:  Nonfocal. Well oriented.  Appropriate affect.   BREAST: No  palpable masses or nodules in either right or left breasts. No palpable axillary supraclavicular or infraclavicular adenopathy no breast tenderness or nipple discharge. (exam performed in the presence of a chaperone)  LABORATORY DATA:  I have reviewed the data as listed CMP Latest Ref Rng & Units 11/17/2019 02/21/2019 02/20/2019  Glucose 70 - 99 mg/dL 92 84 101(H)  BUN 6 - 23 mg/dL _0 Creatinine 0.40 - 1.20 mg/dL 0.98 0.96 1.05(H)  Sodium 135 - 145 mEq/L 142 144 143  Potassium 3.5 - 5.1 mEq/L 3.8 3.5 3.6  Chloride 96 - 112 mEq/L 98 108 106  CO2 19 - 32 mEq/L 37(H) 27 30  Calcium 8.4 - 10.5 mg/dL 9.7 9.2 8.7(L)  Total Protein 6.0 - 8.3 g/dL 7.3 - -  Total Bilirubin 0.2 - 1.2 mg/dL 0.3 - -  Alkaline Phos 39 - 117 U/L 78 - -  AST 0 - 37 U/L 15 - -  ALT 0 - 35 U/L 11 - -  Lab Results  Component Value Date   WBC 9.0 02/21/2019   HGB 8.6 (L) 02/21/2019   HCT 28.6 (L) 02/21/2019   MCV 89.1 02/21/2019   PLT 244 02/21/2019   NEUTROABS 9.0 (H) 02/19/2019    ASSESSMENT & PLAN:  Breast cancer of upper-outer quadrant of right female breast ([place]) Right breast invasive ductal carcinoma status post lumpectomy on 08/18/2015, grade 2, 0.6 cm, with DCIS high-grade, margins negative, 0/2 lymph nodes negative, ER 100%, PR positive, HER-2 negative, Ki-67 20%, T1bN0 stage IA,  Adjuvant radiation from 09/21/2015 to 10/20/2015  Current treatment: Adjuvant antiestrogen therapy with anastrozole 1 mg daily 5 years started January 2017  Severe hot flashes: On Neurontin 600 mg at bedtime.  This has controlled her hot flashes very well.  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 Breast exam today is normal 2. Mammogram October 2020: Benign breast density category B  She will continue to f/u with her PCP and continue on Anastrozole until she sees Dr. Lindi Adie in f/u.  She has  not yet completed a full 5 years of therapy.  She has COPD and meets homebound criteria.  I placed her on the list to receive the covid vaccination at home.  See the attached checklist with her questions, and criteria.    We will see Fronnie back in one year for f/u.  She was recommended to continue with the appropriate pandemic precautions. She knows to call for any questions that may arise between now and her next appointment.  We are happy to see her sooner if needed.   Return to clinic in 1 year for follow-up    Orders Placed This Encounter  Procedures  . DG Bone Density    [employer] Pf: 2008??? Wt: 222 Pt advised to stop clai- multi vitm 48 hrs :     Standing Status:   Future    Standing Expiration Date:   04/01/2021    Order Specific Question:   Reason for Exam (SYMPTOM  OR DIAGNOSIS REQUIRED)    Answer:   estrogen deficiency    Order Specific Question:   Is the patient pregnant?    Answer:   No    Order Specific Question:   Preferred imaging location?    Answer:   [facility]   Total encounter time: 20 minutes*  Wilber Bihari, NP 04/05/20 1:30 PM Medical Oncology and Hematology [facility] [place], [place] [address] Tel. [phone]    Fax. [phone]  *Total Encounter Time as defined by the Centers for Medicare and Medicaid Services includes, in addition to the face-to-face time of a patient visit (documented in the note above) non-face-to-face time: obtaining and reviewing outside history, ordering and reviewing medications, tests or procedures, care coordination (communications with other health care professionals or caregivers) and documentation in the medical record.

## 2020-04-01 ENCOUNTER — Other Ambulatory Visit: Payer: Self-pay

## 2020-04-01 ENCOUNTER — Encounter: Payer: Self-pay | Admitting: Adult Health

## 2020-04-01 ENCOUNTER — Inpatient Hospital Stay: Payer: Medicare PPO | Attending: Hematology and Oncology | Admitting: Adult Health

## 2020-04-01 VITALS — BP 134/67 | HR 76 | Temp 97.6°F | Resp 18 | Ht 65.0 in | Wt 222.1 lb

## 2020-04-01 DIAGNOSIS — Z170 Estrogen receptor positive status [ER+]: Secondary | ICD-10-CM

## 2020-04-01 DIAGNOSIS — Z7952 Long term (current) use of systemic steroids: Secondary | ICD-10-CM | POA: Diagnosis not present

## 2020-04-01 DIAGNOSIS — D869 Sarcoidosis, unspecified: Secondary | ICD-10-CM | POA: Insufficient documentation

## 2020-04-01 DIAGNOSIS — C50411 Malignant neoplasm of upper-outer quadrant of right female breast: Secondary | ICD-10-CM | POA: Insufficient documentation

## 2020-04-01 DIAGNOSIS — N951 Menopausal and female climacteric states: Secondary | ICD-10-CM | POA: Insufficient documentation

## 2020-04-01 DIAGNOSIS — Z9981 Dependence on supplemental oxygen: Secondary | ICD-10-CM | POA: Insufficient documentation

## 2020-04-01 DIAGNOSIS — Z7982 Long term (current) use of aspirin: Secondary | ICD-10-CM | POA: Diagnosis not present

## 2020-04-01 DIAGNOSIS — Z79811 Long term (current) use of aromatase inhibitors: Secondary | ICD-10-CM | POA: Diagnosis not present

## 2020-04-01 DIAGNOSIS — Z79899 Other long term (current) drug therapy: Secondary | ICD-10-CM | POA: Insufficient documentation

## 2020-04-01 DIAGNOSIS — Z791 Long term (current) use of non-steroidal anti-inflammatories (NSAID): Secondary | ICD-10-CM | POA: Insufficient documentation

## 2020-04-01 DIAGNOSIS — Z923 Personal history of irradiation: Secondary | ICD-10-CM | POA: Insufficient documentation

## 2020-04-01 MED ORDER — GABAPENTIN 300 MG PO CAPS: 600.0000 mg | ORAL_CAPSULE | Freq: Every day | ORAL | 3 refills | Status: DC

## 2020-04-01 NOTE — Progress Notes (Signed)
COVID vaccine  I connected by phone with [name] and/or patient's caregiver on 04/01/2020 at 3:47 PM to discuss the potential vaccination through our Homebound vaccination initiative.   Prevaccination Checklist for COVID-19 Vaccines  1.  Are you feeling sick today? no  2.  Have you ever received a dose of a COVID-19 vaccine?  no      If yes, which one? None   3.  Have you ever had an allergic reaction: (This would include a severe reaction [ e.g., anaphylaxis] that required treatment with epinephrine or EpiPen or that caused you to go to the hospital.  It would also include an allergic reaction that occurred within 4 hours that caused hives, swelling, or respiratory distress, including wheezing.) A.  A previous dose of COVID-19 vaccine. no  B.  A vaccine or injectable therapy that contains multiple components, one of which is a COVID-19 vaccine component, but it is not known which component elicited the immediate reaction. no  C.  Are you allergic to polyethylene glycol? no   4.  Have you ever had an allergic reaction to another vaccine (other than COVID-19 vaccine) or an injectable medication? (This would include a severe reaction [ e.g., anaphylaxis] that required treatment with epinephrine or EpiPen or that caused you to go to the hospital.  It would also include an allergic reaction that occurred within 4 hours that caused hives, swelling, or respiratory distress, including wheezing.)  no   5.  Have you ever had a severe allergic reaction (e.g., anaphylaxis) to something other than a component of the COVID-19 vaccine, or any vaccine or injectable medication?  This would include food, pet, venom, environmental, or oral medication allergies.  no   6.  Have you received any vaccine in the last 14 days? no   7.  Have you ever had a positive test for COVID-19 or has a doctor ever told you that you had COVID-19?  no   8.  Have you received passive antibody therapy (monoclonal antibodies  or convalescent serum) as a treatment for COVID-19? no   9.  Do you have a weakened immune system caused by something such as HIV infection or cancer or do you take immunosuppressive drugs or therapies?  yes  Has cancer, but ok to receive injection at any time per Wilber Bihari, NP  10.  Do you have a bleeding disorder or are you taking a blood thinner? no   11.  Are you pregnant or breast-feeding? no   12.  Do you have dermal fillers? no   __________________   This patient is a 59 y.o. female that meets the FDA criteria to receive homebound vaccination. Patient or parent/caregiver understands they have the option to accept or refuse homebound vaccination.  Patient passed the pre-screening checklist and would like to proceed with homebound vaccination.  Based on questionnaire above, I recommend the patient be observed for 15 minutes.  There are an estimated #1 of other household members/caregivers who are also interested in receiving the vaccine.        I will send the patient's information to our scheduling team who will reach out to schedule the patient and potential caregiver/family members for homebound vaccination.    Scot Dock 04/01/2020 3:47 PM

## 2020-04-02 ENCOUNTER — Telehealth: Payer: Self-pay | Admitting: Hematology and Oncology

## 2020-04-02 NOTE — Telephone Encounter (Signed)
Scheduled appts per 5/20 los. Pt confirmed appt date and time.  

## 2020-04-05 NOTE — Assessment & Plan Note (Signed)
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  This has controlled her hot flashes very well.  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 Breast exam today is normal 2. Mammogram October 2020: Benign breast density category B  She will continue to f/u with her PCP and continue on Anastrozole until she sees Dr. Lindi Adie in f/u.  She has not yet completed a full 5 years of therapy.  She has COPD and meets homebound criteria.  I placed her on the list to receive the covid vaccination at home.  See the attached checklist with her questions, and criteria.    We will see [name] back in one year for f/u.  She was recommended to continue with the appropriate pandemic precautions. She knows to call for any questions that may arise between now and her next appointment.  We are happy to see her sooner if needed.   Return to clinic in 1 year for follow-up

## 2020-04-07 ENCOUNTER — Other Ambulatory Visit: Payer: Self-pay | Admitting: Family Medicine

## 2020-04-13 ENCOUNTER — Ambulatory Visit: Payer: Medicare PPO | Attending: Critical Care Medicine

## 2020-04-13 DIAGNOSIS — Z23 Encounter for immunization: Secondary | ICD-10-CM

## 2020-04-13 NOTE — Progress Notes (Signed)
   Covid-19 Vaccination Clinic  Name:  [name]    MRN: [id number] DOB: Oct 17, 1961  04/13/2020  [name] was observed post Covid-19 immunization for 15 minutes without incident. She was provided with Vaccine Information Sheet and instruction to access the V-Safe system.   [name] was instructed to call 911 with any severe reactions post vaccine: Marland Kitchen Difficulty breathing  . Swelling of face and throat  . A fast heartbeat  . A bad rash all over body  . Dizziness and weakness   Immunizations Administered    Name Date Dose VIS Date Route   Moderna COVID-19 Vaccine 04/13/2020 10:17 AM 0.5 mL 10/2019 Intramuscular   Manufacturer: Moderna   Lot: [id number]   [id number]

## 2020-05-10 ENCOUNTER — Ambulatory Visit: Payer: Medicare PPO | Admitting: Primary Care

## 2020-05-10 ENCOUNTER — Other Ambulatory Visit: Payer: Self-pay | Admitting: Family Medicine

## 2020-05-11 ENCOUNTER — Ambulatory Visit: Payer: Medicare PPO | Attending: Critical Care Medicine

## 2020-05-11 ENCOUNTER — Ambulatory Visit: Payer: Medicare PPO

## 2020-05-11 DIAGNOSIS — Z23 Encounter for immunization: Secondary | ICD-10-CM

## 2020-05-11 NOTE — Progress Notes (Signed)
   Covid-19 Vaccination Clinic  Name:  [name]    MRN: [id number] DOB: September 06, 1961  05/11/2020  [name] was observed post Covid-19 immunization for 15 minutes without incident. She was provided with Vaccine Information Sheet and instruction to access the V-Safe system.   [name] was instructed to call 911 with any severe reactions post vaccine: Marland Kitchen Difficulty breathing  . Swelling of face and throat  . A fast heartbeat  . A bad rash all over body  . Dizziness and weakness   Immunizations Administered    Name Date Dose VIS Date Route   Moderna COVID-19 Vaccine 05/11/2020  3:55 PM 0.5 mL 10/2019 Intramuscular   Manufacturer: Moderna   Lot: [id number]   [place]: 35361-443-15

## 2020-05-12 ENCOUNTER — Ambulatory Visit (INDEPENDENT_AMBULATORY_CARE_PROVIDER_SITE_OTHER): Payer: Medicare PPO | Admitting: Primary Care

## 2020-05-12 ENCOUNTER — Other Ambulatory Visit: Payer: Self-pay

## 2020-05-12 ENCOUNTER — Telehealth: Payer: Self-pay | Admitting: Primary Care

## 2020-05-12 DIAGNOSIS — J9612 Chronic respiratory failure with hypercapnia: Secondary | ICD-10-CM

## 2020-05-12 DIAGNOSIS — D869 Sarcoidosis, unspecified: Secondary | ICD-10-CM

## 2020-05-12 DIAGNOSIS — J9611 Chronic respiratory failure with hypoxia: Secondary | ICD-10-CM

## 2020-05-12 DIAGNOSIS — J449 Chronic obstructive pulmonary disease, unspecified: Secondary | ICD-10-CM

## 2020-05-12 MED ORDER — PREDNISONE 5 MG PO TABS
ORAL_TABLET | ORAL | 1 refills | Status: DC
Start: 2020-05-12 — End: 2020-07-09

## 2020-05-12 NOTE — Telephone Encounter (Signed)
Please send in an order to change oxygen company to [employer]. She needs 2-3L oxygen on exertion. She wants a follow-up visit with Dr. Melvyn Novas scheduled in case they need her to come in for qualifying walk. She had no transportation which is why this was a televisit.    11/17/2019 Patient Saturations on Room Air at Rest = 97%  Patient Saturations on Room Air while Ambulating = 79%  Patient Saturations on 2 Liters of oxygen while Ambulating = 90%

## 2020-05-12 NOTE — Telephone Encounter (Signed)
Orders placed to DME for oxygen and DME change. Follow up appointment with Dr. Melvyn Novas made.

## 2020-05-12 NOTE — Progress Notes (Signed)
Virtual Visit via Telephone Note  I connected with [name] on 05/12/20 at  4:00 PM EDT by telephone and verified that I am speaking with the correct person using two identifiers.  Location: Patient: Home Provider: Office   I discussed the limitations, risks, security and privacy concerns of performing an evaluation and management service by telephone and the availability of in person appointments. I also discussed with the patient that there may be a patient responsible charge related to this service. The patient expressed understanding and agreed to proceed.   Brief patient profile:  58 yobf quit smoking 10/2004 dx by transbronchial biopsy 5/92 with NCG  consistent with sarcoid. Since that time she's been off and on prednisone multiple  times with flares of coughing dyspnea and skin involvement > weaned off chronic prednisone Feb 2012 and placed back on 10/10/16 for deteriorating obstructive changes on pfts  With resp failure/ 02 dep    Previous [facility] pulmonary encounters: 12/30/2018  f/u ov/Wert re: post hosp eval / sarcoid on plaquenil and floor of pred 5 mg daily/ 02 2lpm - 3lpm with activity / copd on stiolto Chief Complaint  Patient presents with  . Hospitalization Follow-up    Breathing has improved almost back to her normal baseline. She is coughing with minimal clear sputum. She has been using her albuterol inhaler about once per wk and she has used neb x 3 since hospital d/c.    Dyspnea:  MMRC3 = can't walk 100 yards even at a slow pace at a flat grade s stopping due to sob   Cough: minimal clear  Sleeping: bed is flat/ props up on 2 pillows SABA use: as abov3 02:  2lpm 24/7  rec Prednisone 10 mg 2 daily until better, then 1daily  Please remember to go to the  x-ray department  for your tests - we will call you with the results when they are available    11/17/2019  f/u ov/Wert re: sarcoid on plaquenil 200 as could not tol 400/day and pred 5 mg daily  Chief Complaint   Patient presents with  . Follow-up    COPD, Sarcoidosis, SOB, cough   Dyspnea:  Not limited by breathing from desired activities / uses HC parking though and very slow pace on 02  = MMRC2 = can't walk a nl pace on a flat grade s sob but does fine slow and flat   Cough: some in ams  Sleeping: on back, bed is flat, 2 pillows  SABA use: none  02: 2lpm hs, sitting,  When walking 3lpm         History of Present Illness 05/12/2020 Patient contacted today for televisit/ she is looking to change oxygen companies. States that her breathing has been fine. She has no acute respiratory complaints. She does not have a cough. She wants to change her oxygen company to [employer]. She is using [employer] right now but her [employer] is not covering her oxygen. She re-qualifed for oxygen during her last visit in January 2021 with Dr. Melvyn Novas. She is currently on 5mg  prednisone daily for sarcoidosis, goal is to come of this. She states that she wears 2L oxygen at rest and 3L on exertion   Observations/Objective:  - Appears well, no acute shortness of breath or wheezing  Assessment and Plan:  Sarcoidosis: - No acute symptoms - Plan taper prednisone 5mg  daily alternating with 2.5mg    Chronic respiratory failure - Patient qualified for oxygen during her last office visit  in January 2021 with Dr. Melvyn Novas - 1/4/2021Patient Saturations on Room Air at Rest = 97%;  Room Air while Ambulating = 79%; 2Liters of oxygen while Ambulating = 90% - Patient would like to change oxygen company to [employer] coverage    Follow Up Instructions:   - Patient has a follow-up visit scheduled for July 13th with Dr. Melvyn Novas  I discussed the assessment and treatment plan with the patient. The patient was provided an opportunity to ask questions and all were answered. The patient agreed with the plan and demonstrated an understanding of the instructions.   The patient was advised to call back or seek an in-person  evaluation if the symptoms worsen or if the condition fails to improve as anticipated.  I provided 18 minutes of non-face-to-face time during this encounter.   Martyn Ehrich, NP

## 2020-05-14 ENCOUNTER — Telehealth: Payer: Self-pay | Admitting: Internal Medicine

## 2020-05-14 DIAGNOSIS — D869 Sarcoidosis, unspecified: Secondary | ICD-10-CM

## 2020-05-14 DIAGNOSIS — J449 Chronic obstructive pulmonary disease, unspecified: Secondary | ICD-10-CM

## 2020-05-14 NOTE — Telephone Encounter (Signed)
Spoke with Estill Bamberg with [employer] regarding order for oxygen.  They need a new order for the oxygen.  New order placed.  Contacted patient to clarify what oxygen needs she has at this time.  She verified that she has a POC for use outside her home and a stationary concentrator in her home.  Nothing further needed.

## 2020-05-14 NOTE — Patient Instructions (Signed)
DME order to change oxygen company to [employer] Taper prednisone to 5mg  alternating 2.5mg  every other day Follow up with Dr. Melvyn Novas as scheduled

## 2020-05-18 ENCOUNTER — Ambulatory Visit: Payer: BLUE CROSS/BLUE SHIELD | Admitting: Internal Medicine

## 2020-05-25 ENCOUNTER — Ambulatory Visit: Payer: Medicare PPO | Admitting: Internal Medicine

## 2020-05-26 ENCOUNTER — Other Ambulatory Visit: Payer: Self-pay | Admitting: Family Medicine

## 2020-06-07 ENCOUNTER — Other Ambulatory Visit: Payer: Self-pay | Admitting: Primary Care

## 2020-06-07 ENCOUNTER — Other Ambulatory Visit: Payer: Self-pay | Admitting: Family Medicine

## 2020-06-07 DIAGNOSIS — K219 Gastro-esophageal reflux disease without esophagitis: Secondary | ICD-10-CM

## 2020-06-18 ENCOUNTER — Other Ambulatory Visit: Payer: Medicare PPO

## 2020-07-02 ENCOUNTER — Other Ambulatory Visit: Payer: Self-pay | Admitting: Primary Care

## 2020-07-07 NOTE — Telephone Encounter (Signed)
[name] is patient to continue prednisone as prescribed on 05/12/20?

## 2020-07-07 NOTE — Telephone Encounter (Signed)
She was supposed to have a follow up with Dr. Melvyn Novas. I can refill but want her to go down to 2.5mg  daily if stable

## 2020-07-08 NOTE — Telephone Encounter (Signed)
Called and spoke with patient to see if she still needed a refill on her prednisone. She states she needs refill on her 5mg  prednisone. I believe she is supposed to be tapering down. I also scheduled for for a follow up with Dr. Melvyn Novas on 07/29/20 at [facility] please advise on what you would like for [place] to send in for patient

## 2020-07-09 MED ORDER — PREDNISONE 5 MG PO TABS: ORAL_TABLET | ORAL | 1 refills | Status: DC

## 2020-07-09 NOTE — Addendum Note (Signed)
Addended by: Lia Foyer R on: 07/09/2020 02:16 PM   Modules accepted: Orders

## 2020-07-09 NOTE — Telephone Encounter (Signed)
Yes, I had stated if she was stable to decreased to 2.5mg  daily. We can send in refill for this. Nothing else needed.

## 2020-07-13 ENCOUNTER — Other Ambulatory Visit: Payer: Medicare PPO

## 2020-07-28 ENCOUNTER — Other Ambulatory Visit: Payer: Self-pay | Admitting: Hematology and Oncology

## 2020-07-28 ENCOUNTER — Other Ambulatory Visit: Payer: Self-pay | Admitting: Internal Medicine

## 2020-07-28 DIAGNOSIS — Z170 Estrogen receptor positive status [ER+]: Secondary | ICD-10-CM

## 2020-07-29 ENCOUNTER — Ambulatory Visit: Payer: Medicare PPO | Admitting: Internal Medicine

## 2020-08-16 ENCOUNTER — Other Ambulatory Visit: Payer: Self-pay | Admitting: General Surgery

## 2020-08-16 DIAGNOSIS — Z853 Personal history of malignant neoplasm of breast: Secondary | ICD-10-CM

## 2020-08-26 ENCOUNTER — Encounter: Payer: Self-pay | Admitting: Internal Medicine

## 2020-08-26 ENCOUNTER — Ambulatory Visit (INDEPENDENT_AMBULATORY_CARE_PROVIDER_SITE_OTHER): Payer: Medicare PPO | Admitting: Internal Medicine

## 2020-08-26 ENCOUNTER — Other Ambulatory Visit: Payer: Self-pay

## 2020-08-26 DIAGNOSIS — D869 Sarcoidosis, unspecified: Secondary | ICD-10-CM

## 2020-08-26 DIAGNOSIS — J449 Chronic obstructive pulmonary disease, unspecified: Secondary | ICD-10-CM

## 2020-08-26 DIAGNOSIS — J9612 Chronic respiratory failure with hypercapnia: Secondary | ICD-10-CM | POA: Diagnosis not present

## 2020-08-26 DIAGNOSIS — J9611 Chronic respiratory failure with hypoxia: Secondary | ICD-10-CM | POA: Diagnosis not present

## 2020-08-26 NOTE — Patient Instructions (Signed)
Make sure you check your oxygen saturations at highest level of activity to be sure it stays over 90% and adjust  02 flow upward to maintain this level if needed but remember to turn it back to previous settings when you stop (to conserve your supply).   Please schedule a follow up office visit in 6 months, call sooner if needed

## 2020-08-26 NOTE — Progress Notes (Signed)
Subjective:    Patient ID: [name], female   DOB: 1961/08/18    MRN: [id number]    Brief patient profile:  59yobf quit smoking 10/2004 dx by transbronchial biopsy 5/92 with NCG  consistent with sarcoid. Since that time she's been off and on prednisone multiple  times with flares of coughing dyspnea and skin involvement > weaned off chronic prednisone Feb 2012 and placed back on 10/10/16 for deteriorating obstructive changes on pfts  With resp failure/ 02 dep        History of Present Illness  02/20/2017  f/u ov/[name] re:   GOLD III/ pred 10 mg daily and bevespi 2bid  and alb rare  Chief Complaint  Patient presents with  . Follow-up    6wk rov. pt states breathing is baseline. pt reports of sob with exertion & occ prod cough with green mucus mainly in the morning   Doe better :  [facility] = can't walk a nl pace on a flat grade s sob but does fine slow and flat eg shopping on  2lpm / ok at rest s 02  Uses 02 2lpm sleeping  rec Drop prednisone to 10 mg one half daily      NP eval 05/14/17  We will decrease your prednisone to 2.5 mg daily. We will prescribe 5 mg tablets. We will renew your prescription for Symbicort.  2 puffs twice daily. Rinse mouth after use. Follow up appointment with Dr. Melvyn Novas in 2 months. We will refer you to Pulmonary rehab > started Aug 2018  Referral to a dietician for weight loss >  at rehab     Date of Admission: 09/07/2017                    Date of Discharge: 09/09/17    Discharge Diagnoses/Problem List:  COPD GOLD stage IV HTN Anemia HLD Breast cancer s/p resection Sarcoidosis GERD      11/26/2017  f/u ov/[name] re:  Sarcoid/skin involvement chronic cough ? Sinus dz/ using med calendar well / pednisone 10 mg daily  Chief Complaint  Patient presents with  . Follow-up    Cough has improved some, but still producing some greenish yellow sputum first thing in the am.  She has not had to use her albuterol inhaler. She c/o shakiness in her  hands for the past couple of months.    less cough p augmentin but still am sputum green / no noct symptoms Doing well with rehab but sometimes has to turn 02 up to 4 lpm there but doesn't do so in other settings and concerned about wt gain  rec Start plaquenil 200 mg daily  Ok to adjust the prednisone 44m  from 1-2 pills in am as per med calendar See calendar for specific medication instructions    01/10/2018  f/u ov/[name] re:  Sarcoid/ plaquenil 200 mg daily plus pred 5 mg daily / last time off was one year prior/ 02 = 2lpm floor and 4lpm with ex Chief Complaint  Patient presents with  . Follow-up    repeat LFT's today. She states she has noticed some shaking in her hands for a while now and forgot to mention this at last ov. Her breathing is doing well and she is down to 5 mg pred.   Dyspnea:  Ex bike x 15-30 min / low resistance and 02 up to 3-4lpm sats in mid 90s Cough: no Sleep: on 2lpm / one pillow  SABA use:  Not needing  rec Try prednisone 5 mg  Reduce to one half daily to see if not doing as well and if not increase prednisone to 5 mg and call me for higher dose of plaquenil  Late add: with esr high and lfts ok rec bid plaquenil and return in one month for pfts/lfts> could not tol 400 mg daily due to n and v but ok on 200 mg daily      05/21/2018  f/u ov/[name] re:  codp GOLD IV/ 02 dep on pred 5 mg x 2  daily for sarcoid / intol of plaquenil > 200 mg (N/V) Using med cal well  Chief Complaint  Patient presents with  . Follow-up    Breathing unchanged since the last visit. She has prod cough in the am's with min green sputum. She is using her albuterol inhaler 1 x per wk on average.   Dyspnea:  On 3-4 lpm does [employer] / also able to do ex bike x 15 min  Cough: just in am , slt discolored mucus  SABA use: rare, usually if goes out in hear  02: 2lpm hs and 3-4 lpm daytime   rec Please see patient coordinator before you leave today  to schedule referral to dermatology  No change  in medications except use prednisone 5 mg  X 2 when breathing/ coughing worse then 1 daily thereafter Follow the medication calendar we reviewed today    11/22/2018  f/u ov/[name] re:  Sarcooid/ copd/ 02 dep resp failure/ indolent onset x 3  weeks no appetite fatigue  Increased sob on pred 5 mg daily / no med calendar  Chief Complaint  Patient presents with  . Follow-up    Pt c/o increased SOB and cough the past few days. Her cough is prod at times with yellow to clear sputum. She states her mouth is very dry in the mornings. Her appetite is poor and she is down 9 lbs since the last visit. She is using her albuterol inhaler 2 x per wk on average.    Dyspnea:  mb and back flat ok on 3lpm Pulsed when walks  Cough: none Sleeping: on side bed is horizontal SABA use: couple of times a week 02: 2lpm and up to 3lpm walk rec Please remember to go to the lab department   for your tests - we will call you with the results when they are available. Take 20 prednisone daily until better then 10 mg per day and w/in 3 days got worse more /sob/ decreased sats so admit 12/17/18   Admit date: 12/17/2018 Discharge date: 12/22/2018    DISCHARGE DIAGNOSES:  Multifocal pneumonia likely community-acquired Acute COPD exacerbation, improved Acute on chronic respiratory failure with hypoxia History of sarcoidosis on chronic steroids Acute kidney injury, resolved Essential hypertension History of breast cancer   12/30/2018  f/u ov/[name] re: post hosp eval / sarcoid on plaquenil and floor of pred 5 mg daily/ 02 2lpm - 3lpm with activity / copd on stiolto Chief Complaint  Patient presents with  . Hospitalization Follow-up    Breathing has improved almost back to her normal baseline. She is coughing with minimal clear sputum. She has been using her albuterol inhaler about once per wk and she has used neb x 3 since hospital d/c.    Dyspnea:  MMRC3 = can't walk 100 yards even at a slow pace at a flat grade s stopping  due to sob   Cough: minimal clear  Sleeping: bed is flat/ props up  on 2 pillows SABA use: as abov3 02:  2lpm 24/7  rec Prednisone 10 mg 2 daily until better, then 1daily  Please remember to go to the  x-ray department  for your tests - we will call you with the results when they are available    11/17/2019  f/u ov/[name] re: sarcoid on plaquenil 200 as could not tol 400/day and pred 5 mg daily  Chief Complaint  Patient presents with  . Follow-up    COPD, Sarcoidosis, SOB, cough   Dyspnea:  Not limited by breathing from desired activities / uses HC parking though and very slow pace on 02  = MMRC2 = can't walk a nl pace on a flat grade s sob but does fine slow and flat   Cough: some in ams  Sleeping: on back, bed is flat, 2 pillows  SABA use: none  02: 2lpm hs, sitting,  When walking 3lpm  rec We will certify you for 02 today  Add pepcid 20 mg at bedtime to see if helps the am cough  If cough or breathing worse, go to 10 mg per day dose until better then resume previous dose  Late add:  No need for 02 at rest if sats >90%    08/26/2020  f/u ov/[name] re: sarcoid on plaquenil 200 mg pred 5/2.5  And gold iv copd/ 02 dep on stiolto  Chief Complaint  Patient presents with  . Follow-up    Breathing is overall doing well. She has occ cough in the am's- prod with clear sputum. She is using her albuterol inhaler about once per wk and has not used neb recently.   Dyspnea:  No outside walking  Cough: minimal throrat clearing only  Sleeping: bed is flat/ 2 pillows  SABA use: rarely any at all and all hfa/ no neb  02: 2lpm hs / up to 3lpm walking but not checking sats at all   No obvious day to day or daytime variability or assoc excess/ purulent sputum or mucus plugs or hemoptysis or cp or chest tightness, subjective wheeze or overt sinus or hb symptoms.   Sleeping  without nocturnal  or early am exacerbation  of respiratory  c/o's or need for noct saba. Also denies any obvious fluctuation of  symptoms with weather or environmental changes or other aggravating or alleviating factors except as outlined above   No unusual exposure hx or h/o childhood pna/ asthma or knowledge of premature birth.  Current Allergies, Complete Past Medical History, Past Surgical History, Family History, and Social History were reviewed in [employer] record.  ROS  The following are not active complaints unless bolded Hoarseness, sore throat, dysphagia, dental problems, itching, sneezing,  nasal congestion or discharge of excess mucus or purulent secretions, ear ache,   fever, chills, sweats, unintended wt loss or wt gain, classically pleuritic or exertional cp,  orthopnea pnd or arm/hand swelling  or leg swelling, presyncope, palpitations, abdominal pain, anorexia, nausea, vomiting, diarrhea  or change in bowel habits or change in bladder habits, change in stools or change in urine, dysuria, hematuria,  rash, arthralgias, visual complaints, headache, numbness, weakness or ataxia or problems with walking or coordination,  change in mood or  memory.        Current Meds  Medication Sig  . albuterol (PROVENTIL) (2.5 MG/3ML) 0.083% nebulizer solution Take 3 mLs (2.5 mg total) by nebulization every 6 (six) hours as needed for wheezing or shortness of breath.  Marland Kitchen albuterol (VENTOLIN HFA)  108 (90 Base) MCG/ACT inhaler INHALE 2 PUFFS EVERY 6 HOURS AS NEEDED FOR WHEEZING OR SHORTNESS OF BREATH  . anastrozole (ARIMIDEX) 1 MG tablet TAKE 1 TABLET EVERY DAY  . aspirin EC 81 MG tablet Take 1 tablet (81 mg total) by mouth daily. Start 02/23/19  . atorvastatin (LIPITOR) 20 MG tablet TAKE 1 TABLET (20 MG TOTAL) BY MOUTH DAILY.  . ferrous sulfate 325 (65 FE) MG tablet Take 1 tablet (325 mg total) by mouth daily with breakfast. (Patient taking differently: Take 325 mg by mouth every other day. )  . fluticasone (FLONASE) 50 MCG/ACT nasal spray USE 2 SPRAYS IN BOTH NOSTRILS DAILY AS NEEDED FOR ALLERGIES  .  gabapentin (NEURONTIN) 300 MG capsule Take 2 capsules (600 mg total) by mouth at bedtime.  . Guaifenesin (MUCINEX MAXIMUM STRENGTH) 1200 MG TB12 Take 1,200 mg by mouth 2 (two) times daily as needed (chest congestion).   . hydroxychloroquine (PLAQUENIL) 200 MG tablet Take 1 tablet (200 mg total) by mouth daily.  . meloxicam (MOBIC) 7.5 MG tablet Take 1 tablet (7.5 mg total) by mouth daily.  . methocarbamol (ROBAXIN) 500 MG tablet Take 1 tablet (500 mg total) by mouth 2 (two) times daily.  . metoprolol tartrate (LOPRESSOR) 25 MG tablet TAKE 1 TABLET (25 MG TOTAL) BY MOUTH 2 (TWO) TIMES DAILY.  . Multiple Vitamin (MULTIVITAMIN) capsule Take 1 capsule by mouth daily.  Marland Kitchen omeprazole (PRILOSEC) 20 MG capsule TAKE 1 CAPSULE EVERY DAY  . OXYGEN 2lpm with rest and 3lpm with exertion  . predniSONE (DELTASONE) 5 MG tablet Take 1 tab daily alternating with 1/2 tablet every other day  . senna (SENOKOT) 8.6 MG TABS tablet Take 1 tablet (8.6 mg total) by mouth 2 (two) times daily.  . sertraline (ZOLOFT) 50 MG tablet TAKE 1 TABLET EVERY DAY  . Tiotropium Bromide-Olodaterol (STIOLTO RESPIMAT) 2.5-2.5 MCG/ACT AERS Inhale 2 puffs into the lungs daily.  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 Unice Cobble  -h/o Daily prednisone since 2007   > off completely Feb 2012 with no problem> restarted  09/2016  ? of SUPERFICIAL VEIN THROMBOSIS (ICD-453.9)   ENDOMETRIAL POLYP (ICD-621.0)   UTERINE FIBROID (ICD-218.9)  RHINITIS, ALLERGIC (ICD-477.9)  HYPERTENSION, BENIGN SYSTEMIC (ICD-401.1)  COPD (ICD-496)  - PFT's 06/26/08 FEV1 1.09 ratio  - PFTs 03/02/10 FEV1 1.06 ratio 34  - PFT's 08/01/2011 FEV1 1.6 (42%)  41 ratio  DLCO 55 corrects 77 - HFA  50%  06/05/2011  > 75% 06/29/2011  - Spiriva trial March 02, 2010 > ? Some better but no worse off 04/2011 - Rehab started mid aug 2018  BACK PAIN, LOW (ICD-724.2)  ANEMIA, IRON DEFICIENCY, UNSPEC. (ICD-280.9)  Health Maintenance.......................................................   [facility]   Family History:  crohn`s in daughter, M-lupus, htn, asthma,  no Ca, DM, CAD    Social History:   lives with twin children and grandson. single; >20 pack year history, quit in 2005; no EtOH  Laid off  from works for [employer] Sept 2013           Objective:   Physical Exam   08/26/2020  219  11/17/2019     223   12/30/2018  188  wt 166  Oct 12, 2009 >170 October 28, 2010 > 169 06/29/2011 >>171 08/24/2011 > 01/15/2012  171 > 04/24/2012 176 > 06/28/2012  175> 08/13/2012 > 08/30/2012  173 > 170 11/28/2012 > 01/29/2013  176 > 174 >173 06/16/2013 > 171 09/18/2013 > 12/19/2013 177 >179 04/13/2014>  04/23/2014 177 > 08/21/2014 184 >182 11/27/2014 > 05/12/2015 177  > 05/11/2016   177 > 10/27/2016  175 > 11/09/2016 182 > 01/09/2017 198 > 02/20/2017  206 >  07/17/2017   209 >  09/03/2017   210 > 09/28/2017  206  > 11/26/2017   209 > 01/10/2018  210> 02/19/2018  206> 05/21/2018  198 > 08/06/2018  197 > 08/21/2018  196 > 11/22/2018  187    amb obese bf nad   Vital signs reviewed  08/26/2020  - Note at rest 02 sats  95% on 2lpm POC   Reports edentulous   HEENT : pt wearing mask not removed for exam due to covid - 19 concerns.    NECK :  without JVD/Nodes/TM/ nl carotid upstrokes bilaterally   LUNGS: no acc muscle use,  Mild barrel  contour chest wall with bilateral  Distant bs s audible wheeze and  without cough on insp or exp maneuvers  and mild  Hyperresonant  to  percussion bilaterally     CV:  RRR  no s3 or murmur or increase in P2, and no edema   ABD:  soft and nontender with pos end  insp Hoover's  in the supine position. No bruits or organomegaly appreciated, bowel sounds nl  MS:   Nl gait/  ext warm  without deformities, calf tenderness, cyanosis or clubbing No obvious joint restrictions   SKIN: warm and dry without lesions    NEURO:  alert, approp, nl sensorium with  no motor or cerebellar deficits apparent.             Assessment:

## 2020-08-27 ENCOUNTER — Encounter: Payer: Self-pay | Admitting: Internal Medicine

## 2020-08-27 NOTE — Assessment & Plan Note (Signed)
Quit smoking 2005  - PFT's 06/26/08 FEV1 1.09 ratio  - PFTs 03/02/10 FEV1 1.06 ratio 34    PFT's 08/01/2011 FEV1 1.6 (42%)  41 ratio  DLCO 55 corrects 77 - Spiriva trial March 02, 2010 > ? better but not worse off it 04/2011 - 05/12/2015 p extensive coaching HFA effectiveness =    90%  - Referred to Rehab 08/21/2014 > could not arrange due to schedule  -med calendar 06/16/2013 > did not bring to office as requested 12/19/13 or 04/23/14  Or 08/21/2014  - 11/09/2016    changed symb to bevespi since taking chronic pred for pf    - 02/20/2017 decreased pred to 5 mg daily > weaned to 2.5 mg qd as of 07/17/2017  - started rehab mid Aug 2018  - 07/17/2017 changed to pred 2.5 even days > did not tolerate so changed  Around  07/28/17 back to 5 mg qod  - Spirometry 09/03/2017  FEV1 0.52 (23%)  Ratio 40 with typical curvature   - PFT's  02/19/2018  FEV1 0.65 (29 % ) ratio 43  p 1 % improvement from saba p stiolto/ pred 5 mg  prior to study with DLCO  39 % corrects to 56  % for alv volume - 12/30/2018  After extensive coaching inhaler device,  effectiveness =    95%   With smi > continue stiolto    Pt is Group B in terms of symptom/risk and laba/lama therefore appropriate rx at this point >>>  Continue stiolto and prn saba    Re saba: I spent extra time with pt today reviewing appropriate use of albuterol for prn use on exertion with the following points: 1) saba is for relief of sob that does not improve by walking a slower pace or resting but rather if the pt does not improve after trying this first. 2) If the pt is convinced, as many are, that saba helps recover from activity faster then it's easy to tell if this is the case by re-challenging : ie stop, take the inhaler, then p 5 minutes try the exact same activity (intensity of workload) that just caused the symptoms and see if they are substantially diminished or not after saba 3) if there is an activity that reproducibly causes the symptoms, try the saba 15 min before  the activity on alternate days   If in fact the saba really does help, then fine to continue to use it prn but advised may need to look closer at the maintenance regimen being used to achieve better control of airways disease with exertion.

## 2020-08-27 NOTE — Assessment & Plan Note (Signed)
New start 09/2016 at admit see records - 10/18/16 Patient Saturations on Room Air at Rest = 92% Patient Saturations on Room Air while Ambulating = 84% Patient Saturations on 2 Liters of oxygen while Ambulating = 90% - HCO3  11/09/2016 = 39  - HCO3   03/16/17        = 34  - HC03    01/10/2018  = 40  - HC03  11/22/2018     = 38  - HC03  12/20/2018       = 26  - HC03  11/17/2019       = 37    11/17/2019 Patient Saturations on Room Air at Rest = 97% Patient Saturations on Room Air while Ambulating = 79% Patient Saturations on 2 Liters of oxygen while Ambulating = 90%  As of  08/26/2020  rec 2lpm sleeping  and titrate with ambulation to maintain > 90%   Again reminded: Make sure you check your oxygen saturations at highest level of activity to be sure it stays over 90% and adjust  02 flow upward to maintain this level if needed but remember to turn it back to previous settings when you stop (to conserve your supply).           Each maintenance medication was reviewed in detail including emphasizing most importantly the difference between maintenance and prns and under what circumstances the prns are to be triggered using an action plan format where appropriate.  Total time for H and P, chart review, counseling, reviewing saba/hfa device and generating customized AVS unique to this office visit / charting = 15 min

## 2020-08-27 NOTE — Assessment & Plan Note (Signed)
Positive transbronchial biopsy 02/19/91 by Dr. Unice Cobble  -Daily prednisone since 2007 with ? adrenal insufficiency iatrogenic > off completely Feb 2012 with no recurrence - PFT's 06/26/08 FEV1  1.09 ratio  - PFTs  03/02/10 FEV1  1.06 (42%) ratio 34 and no better p B2,  DLC0 48% corrects to 64% - PFT's 08/01/2011 FEV1 1.6 (42%)  41 ratio  DLCO 55 corrects 77 - PFTs 10/10/16    FEV1  0.87(38)ratio 41 and DLCO 36/39c corrects to 51 for alv vol > placed back on daily pred  - 02/20/2017 decreased pred to 5 mg daily > to 2.55m daily as of 07/17/2017  - 07/17/2017 try 2.5 mg qod > 09/28/2017 changed to 20 mg until better and new floor of 5 mg daily plus consider plaquenil due to new skin involvement on face  - opth eval neg 11/10/17 - Start plaquenil 200 mg daily 11/26/2017  - 01/10/2018 ESR up to 97 with pred taper so rec bid plaquenil > could only tol 200 mg daily  - 02/19/2018 ESR = 94 on 550mdaily  - 05/21/2018 referred to dermatology  - 08/21/2018   ESR = 80  On prednisone 10 mg/ plaquenil 200 mg daily > tapered to floor of 5 mg daily  - 11/22/2018  ESR  = 113 on pred 5 mg and plaq 200 mg with calcium 12.1 so rec pred 20 mg ub then new floor of 10 mg daily  - 02/24/2019 clinically improved and doing fine on Plaquenil 200 plus pred  10 mg daily so rec rechalleng with slow taper to 5 mg daily    Very little evidence of active dz on plaquenil 200 mg daily  and 5/2.5 so rec try 2.5 mg daily using the lowest effective dose approach

## 2020-09-08 ENCOUNTER — Ambulatory Visit: Payer: Medicare PPO

## 2020-09-10 ENCOUNTER — Other Ambulatory Visit: Payer: Self-pay | Admitting: Family Medicine

## 2020-09-15 ENCOUNTER — Ambulatory Visit: Payer: Medicare PPO

## 2020-09-19 ENCOUNTER — Other Ambulatory Visit: Payer: Self-pay | Admitting: Primary Care

## 2020-09-21 ENCOUNTER — Ambulatory Visit: Payer: Medicare PPO

## 2020-09-22 ENCOUNTER — Ambulatory Visit: Payer: Medicare PPO

## 2020-10-10 ENCOUNTER — Other Ambulatory Visit: Payer: Self-pay | Admitting: Family Medicine

## 2020-10-13 ENCOUNTER — Other Ambulatory Visit: Payer: Self-pay | Admitting: Hematology and Oncology

## 2020-10-13 DIAGNOSIS — Z170 Estrogen receptor positive status [ER+]: Secondary | ICD-10-CM

## 2020-10-25 ENCOUNTER — Other Ambulatory Visit: Payer: Self-pay | Admitting: Family Medicine

## 2020-10-25 DIAGNOSIS — D869 Sarcoidosis, unspecified: Secondary | ICD-10-CM

## 2020-11-21 ENCOUNTER — Other Ambulatory Visit: Payer: Self-pay | Admitting: Internal Medicine

## 2020-11-30 ENCOUNTER — Ambulatory Visit: Payer: Medicare PPO

## 2020-12-01 ENCOUNTER — Ambulatory Visit: Payer: Medicare PPO

## 2020-12-09 ENCOUNTER — Other Ambulatory Visit: Payer: Medicare PPO

## 2020-12-16 ENCOUNTER — Other Ambulatory Visit: Payer: Medicare PPO

## 2020-12-27 ENCOUNTER — Other Ambulatory Visit: Payer: Self-pay | Admitting: Hematology and Oncology

## 2020-12-27 DIAGNOSIS — Z170 Estrogen receptor positive status [ER+]: Secondary | ICD-10-CM

## 2020-12-27 DIAGNOSIS — C50411 Malignant neoplasm of upper-outer quadrant of right female breast: Secondary | ICD-10-CM

## 2021-01-01 ENCOUNTER — Other Ambulatory Visit: Payer: Self-pay | Admitting: Family Medicine

## 2021-01-01 DIAGNOSIS — K219 Gastro-esophageal reflux disease without esophagitis: Secondary | ICD-10-CM

## 2021-01-05 ENCOUNTER — Other Ambulatory Visit: Payer: Self-pay | Admitting: Internal Medicine

## 2021-01-11 ENCOUNTER — Inpatient Hospital Stay: Admission: RE | Admit: 2021-01-11 | Payer: Medicare PPO | Source: Ambulatory Visit

## 2021-01-24 ENCOUNTER — Other Ambulatory Visit: Payer: Self-pay | Admitting: Adult Health

## 2021-01-24 DIAGNOSIS — C50411 Malignant neoplasm of upper-outer quadrant of right female breast: Secondary | ICD-10-CM

## 2021-01-24 DIAGNOSIS — Z170 Estrogen receptor positive status [ER+]: Secondary | ICD-10-CM

## 2021-01-25 ENCOUNTER — Other Ambulatory Visit: Payer: Medicare PPO

## 2021-02-22 ENCOUNTER — Ambulatory Visit
Admission: RE | Admit: 2021-02-22 | Discharge: 2021-02-22 | Disposition: A | Discharge status: Home / Self Care | Payer: Medicare HMO | Source: Ambulatory Visit | Attending: General Surgery | Admitting: General Surgery

## 2021-02-22 ENCOUNTER — Other Ambulatory Visit: Payer: Self-pay

## 2021-02-22 DIAGNOSIS — Z853 Personal history of malignant neoplasm of breast: Secondary | ICD-10-CM

## 2021-02-24 ENCOUNTER — Ambulatory Visit: Payer: Medicare PPO

## 2021-03-03 ENCOUNTER — Ambulatory Visit: Payer: Medicare HMO

## 2021-03-09 ENCOUNTER — Other Ambulatory Visit: Payer: Self-pay | Admitting: *Deleted

## 2021-03-09 MED ORDER — METOPROLOL TARTRATE 25 MG PO TABS: 25.0000 mg | ORAL_TABLET | Freq: Two times a day (BID) | ORAL | 1 refills | Status: DC

## 2021-03-10 ENCOUNTER — Ambulatory Visit: Payer: Medicare HMO | Attending: Critical Care Medicine

## 2021-03-10 DIAGNOSIS — Z23 Encounter for immunization: Secondary | ICD-10-CM

## 2021-03-10 NOTE — Progress Notes (Signed)
   Covid-19 Vaccination Clinic  Name:  [name]    MRN: [id number] DOB: August 22, 1961  03/10/2021  Ms. [name] was observed post Covid-19 immunization for 15 minutes without incident. She was provided with Vaccine Information Sheet and instruction to access the V-Safe system.   Ms. [name] was instructed to call 911 with any severe reactions post vaccine: Marland Kitchen Difficulty breathing  . Swelling of face and throat  . A fast heartbeat  . A bad rash all over body  . Dizziness and weakness   Immunizations Administered    Name Date Dose VIS Date Route   Moderna Covid-19 Booster Vaccine 03/10/2021 12:58 AM 0.25 mL 09/01/2020 Intramuscular   Manufacturer: Moderna   Lot: [id number]   [place]: [id number]

## 2021-03-22 ENCOUNTER — Other Ambulatory Visit: Payer: Medicare HMO

## 2021-03-27 ENCOUNTER — Other Ambulatory Visit: Payer: Self-pay | Admitting: Family Medicine

## 2021-03-30 ENCOUNTER — Other Ambulatory Visit: Payer: Self-pay | Admitting: Hematology and Oncology

## 2021-03-30 ENCOUNTER — Other Ambulatory Visit: Payer: Self-pay | Admitting: Adult Health

## 2021-03-30 DIAGNOSIS — Z170 Estrogen receptor positive status [ER+]: Secondary | ICD-10-CM

## 2021-04-04 NOTE — Progress Notes (Signed)
Patient Care Team: Benay Pike, MD as PCP - Jeanine Luz, MD as Consulting Physician (Hematology and Oncology) Eppie Gibson, MD as Attending Physician (Radiation Oncology) Stark Klein, MD as Consulting Physician (General Surgery)  DIAGNOSIS:    ICD-10-CM   1. Malignant neoplasm of upper-outer quadrant of right breast in female, estrogen receptor positive ([place])  C50.411    Z17.0     SUMMARY OF ONCOLOGIC HISTORY: Oncology History  Breast cancer of upper-outer quadrant of right female breast ([place])  07/08/2015 Mammogram   Right breast calcifications 11 mm span   08/04/2015 Initial Diagnosis   Biopsy right breast: Invasive ductal carcinoma with DCIS with calcifications, lymphovascular invasion is identified, grade 2-3, ER 100%, PR 100% Ki-67 20%, HER-2 negative ratio 1.46   08/04/2015 Clinical Stage   Stage IA: T1b N0   08/18/2015 Definitive Surgery   Right  lumpectomy: invasive ductal carcinoma, grade 2, 0.6 cm, with DCIS high-grade, margins negative, 0/2 lymph nodes negative, ER 100%, PR positive, HER-2 negative, Ki-67 20%   08/18/2015 Pathologic Stage   Stage IA: T1b N0   08/18/2015 Oncotype testing   Insufficient tissue to perform   09/21/2015 - 10/20/2015 Radiation Therapy   Adjuvant radiation therapy: 1) Right Breast / 40.05 Gy in 15 fractions 2) Right breast boost / 10 Gy in 5 fractions   10/08/2015 - 10/11/2015 Hospital Admission   cellulitis of the right breast   11/16/2015 -  Anti-estrogen oral therapy   Anastrozole 1 mg daily once hot flashes better controlled. Planned duration of therapy 5 years.      CHIEF COMPLIANT: Follow-up of right breast cancer on anastrozole   INTERVAL HISTORY: [name] is a 60 y.o. with above-mentioned history of right breast cancer treated with lumpectomy, radiation, and who is currently on anastrozole therapy. Mammogram on 02/22/21 showed no evidence of malignancy bilaterally. She presents to the clinic today for  follow-up.   ALLERGIES:  is allergic to codeine.  MEDICATIONS:  Current Outpatient Medications  Medication Sig Dispense Refill  . atorvastatin (LIPITOR) 20 MG tablet TAKE 1 TABLET (20 MG TOTAL) BY MOUTH DAILY. 90 tablet 3  . hydroxychloroquine (PLAQUENIL) 200 MG tablet TAKE 1 TABLET EVERY DAY 90 tablet 2  . omeprazole (PRILOSEC) 20 MG capsule TAKE 1 CAPSULE EVERY DAY 90 capsule 1  . albuterol (PROVENTIL) (2.5 MG/3ML) 0.083% nebulizer solution Take 3 mLs (2.5 mg total) by nebulization every 6 (six) hours as needed for wheezing or shortness of breath. 75 mL 1  . albuterol (VENTOLIN HFA) 108 (90 Base) MCG/ACT inhaler INHALE 2 PUFFS EVERY 6 HOURS AS NEEDED FOR WHEEZING OR SHORTNESS OF BREATH 54 g 0  . anastrozole (ARIMIDEX) 1 MG tablet TAKE 1 TABLET EVERY DAY 90 tablet 0  . aspirin EC 81 MG tablet Take 1 tablet (81 mg total) by mouth daily. Start 02/23/19    . ferrous sulfate 325 (65 FE) MG tablet Take 1 tablet (325 mg total) by mouth daily with breakfast. (Patient taking differently: Take 325 mg by mouth every other day. ) 90 tablet 0  . fluticasone (FLONASE) 50 MCG/ACT nasal spray USE 2 SPRAYS IN BOTH NOSTRILS DAILY AS NEEDED FOR ALLERGIES 48 g 3  . gabapentin (NEURONTIN) 300 MG capsule TAKE 2 CAPSULES AT BEDTIME 180 capsule 3  . Guaifenesin (MUCINEX MAXIMUM STRENGTH) 1200 MG TB12 Take 1,200 mg by mouth 2 (two) times daily as needed (chest congestion).     . meloxicam (MOBIC) 7.5 MG tablet Take 1 tablet (7.5 mg  total) by mouth daily. 15 tablet 0  . methocarbamol (ROBAXIN) 500 MG tablet Take 1 tablet (500 mg total) by mouth 2 (two) times daily. 20 tablet 0  . metoprolol tartrate (LOPRESSOR) 25 MG tablet Take 1 tablet (25 mg total) by mouth 2 (two) times daily. 180 tablet 1  . Multiple Vitamin (MULTIVITAMIN) capsule Take 1 capsule by mouth daily.    . OXYGEN 2lpm with rest and 3lpm with exertion    . predniSONE (DELTASONE) 5 MG tablet TAKE 1 TAB DAILY ALTERNATING WITH 1/2 TABLET EVERY OTHER DAY 60  tablet 3  . senna (SENOKOT) 8.6 MG TABS tablet Take 1 tablet (8.6 mg total) by mouth 2 (two) times daily. 120 each 0  . sertraline (ZOLOFT) 50 MG tablet TAKE 1 TABLET EVERY DAY 90 tablet 1  . STIOLTO RESPIMAT 2.5-2.5 MCG/ACT AERS INHALE 2 PUFFS INTO THE LUNGS DAILY. 12 g 1  . triamterene-hydrochlorothiazide (MAXZIDE-25) 37.5-25 MG tablet TAKE 1 TABLET EVERY DAY 90 tablet 1   No current facility-administered medications for this visit.    PHYSICAL EXAMINATION: ECOG PERFORMANCE STATUS: 1 - Symptomatic but completely ambulatory  Vitals:   04/05/21 1341  BP: 136/70  Pulse: 78  Resp: 20  Temp: 97.7 F (36.5 C)  SpO2: 94%   Filed Weights   04/05/21 1341  Weight: 211 lb 1.6 oz (95.8 kg)    BREAST: No palpable masses or nodules in either right or left breasts. No palpable axillary supraclavicular or infraclavicular adenopathy no breast tenderness or nipple discharge. (exam performed in the presence of a chaperone)  LABORATORY DATA:  I have reviewed the data as listed CMP Latest Ref Rng & Units 11/17/2019 02/21/2019 02/20/2019  Glucose 70 - 99 mg/dL 92 84 101(H)  BUN 6 - 23 mg/dL _0 Creatinine 0.40 - 1.20 mg/dL 0.98 0.96 1.05(H)  Sodium 135 - 145 mEq/L 142 144 143  Potassium 3.5 - 5.1 mEq/L 3.8 3.5 3.6  Chloride 96 - 112 mEq/L 98 108 106  CO2 19 - 32 mEq/L 37(H) 27 30  Calcium 8.4 - 10.5 mg/dL 9.7 9.2 8.7(L)  Total Protein 6.0 - 8.3 g/dL 7.3 - -  Total Bilirubin 0.2 - 1.2 mg/dL 0.3 - -  Alkaline Phos 39 - 117 U/L 78 - -  AST 0 - 37 U/L 15 - -  ALT 0 - 35 U/L 11 - -    Lab Results  Component Value Date   WBC 9.0 02/21/2019   HGB 8.6 (L) 02/21/2019   HCT 28.6 (L) 02/21/2019   MCV 89.1 02/21/2019   PLT 244 02/21/2019   NEUTROABS 9.0 (H) 02/19/2019    ASSESSMENT & PLAN:  Breast cancer of upper-outer quadrant of right female breast ([place]) Right breast invasive ductal carcinoma status post lumpectomy on 08/18/2015, grade 2, 0.6 cm, with DCIS high-grade, margins negative,  0/2 lymph nodes negative, ER 100%, PR positive, HER-2 negative, Ki-67 20%, T1bN0 stage IA,  Adjuvant radiation from 09/21/2015 to 10/20/2015  Current treatment: Adjuvant antiestrogen therapy with anastrozole 1 mg daily 5 years started January 2017 I discussed the duration of antiestrogen therapy.  She completed 5 years and going on her sixth year.  Since she is tolerating it extremely well I recommended continuing for 7 years.   Severe hot flashes: On Neurontin600 mg at bedtime and 300 mg in a.m.This has controlled her hot flashes very well.  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 Breast  exam : 04/05/2021: Benign 2. Mammogram4/10/2021: Benign breast density category B Bone density scheduled for October 2022  Return to clinic in 1 year for follow-up    No orders of the defined types were placed in this encounter.  The patient has a good understanding of the overall plan. she agrees with it. she will call with any problems that may develop before the next visit here.  Total time spent: 20 mins including face to face time and time spent for planning, charting and coordination of care  Rulon Eisenmenger, MD, MPH 04/05/2021  I, Cloyde Reams Dorshimer, am acting as scribe for Dr. Nicholas Lose.  I have reviewed the above documentation for accuracy and completeness, and I agree with the above.

## 2021-04-05 ENCOUNTER — Other Ambulatory Visit: Payer: Self-pay

## 2021-04-05 ENCOUNTER — Inpatient Hospital Stay: Payer: Medicare HMO | Attending: Hematology and Oncology | Admitting: Hematology and Oncology

## 2021-04-05 DIAGNOSIS — Z923 Personal history of irradiation: Secondary | ICD-10-CM | POA: Diagnosis not present

## 2021-04-05 DIAGNOSIS — Z7952 Long term (current) use of systemic steroids: Secondary | ICD-10-CM | POA: Diagnosis not present

## 2021-04-05 DIAGNOSIS — Z79899 Other long term (current) drug therapy: Secondary | ICD-10-CM | POA: Diagnosis not present

## 2021-04-05 DIAGNOSIS — Z7982 Long term (current) use of aspirin: Secondary | ICD-10-CM | POA: Insufficient documentation

## 2021-04-05 DIAGNOSIS — C50411 Malignant neoplasm of upper-outer quadrant of right female breast: Secondary | ICD-10-CM

## 2021-04-05 DIAGNOSIS — Z170 Estrogen receptor positive status [ER+]: Secondary | ICD-10-CM | POA: Diagnosis not present

## 2021-04-05 DIAGNOSIS — J441 Chronic obstructive pulmonary disease with (acute) exacerbation: Secondary | ICD-10-CM | POA: Diagnosis not present

## 2021-04-05 DIAGNOSIS — Z79811 Long term (current) use of aromatase inhibitors: Secondary | ICD-10-CM | POA: Diagnosis not present

## 2021-04-05 MED ORDER — GABAPENTIN 300 MG PO CAPS: 2.0000 | ORAL_CAPSULE | Freq: Every day | ORAL | 3 refills | Status: DC

## 2021-04-05 MED ORDER — ANASTROZOLE 1 MG PO TABS: 1.0000 mg | ORAL_TABLET | Freq: Every day | ORAL | 3 refills | Status: DC

## 2021-04-05 NOTE — Assessment & Plan Note (Signed)
Right breast invasive ductal carcinoma status post lumpectomy on 08/18/2015, grade 2, 0.6 cm, with DCIS high-grade, margins negative, 0/2 lymph nodes negative, ER 100%, PR positive, HER-2 negative, Ki-67 20%, T1bN0 stage IA,  Adjuvant radiation from 09/21/2015 to 10/20/2015  Current treatment: Adjuvant antiestrogen therapy with anastrozole 1 mg daily 5 years started January 2017 I discussed the duration of antiestrogen therapy.  She completed 5 years and going on her sixth year.  Since she is tolerating it extremely well I recommended continuing for 7 years.   Severe hot flashes: On Neurontin600 mg at bedtime.This has controlled her hot flashes very well.  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 Breast exam : 04/05/2021: Benign 2. Mammogram4/10/2021: Benign breast density category B Bone density scheduled for October 2022  Return to clinic in 1 year for follow-up

## 2021-04-18 ENCOUNTER — Other Ambulatory Visit: Payer: Self-pay | Admitting: Internal Medicine

## 2021-04-21 ENCOUNTER — Other Ambulatory Visit: Payer: Self-pay | Admitting: *Deleted

## 2021-04-21 DIAGNOSIS — Z170 Estrogen receptor positive status [ER+]: Secondary | ICD-10-CM

## 2021-04-21 MED ORDER — GABAPENTIN 300 MG PO CAPS: 2.0000 | ORAL_CAPSULE | Freq: Every day | ORAL | 3 refills | Status: DC

## 2021-04-22 ENCOUNTER — Other Ambulatory Visit: Payer: Self-pay | Admitting: *Deleted

## 2021-04-22 DIAGNOSIS — Z170 Estrogen receptor positive status [ER+]: Secondary | ICD-10-CM

## 2021-05-26 ENCOUNTER — Other Ambulatory Visit: Payer: Medicare PPO

## 2021-06-02 ENCOUNTER — Other Ambulatory Visit: Payer: Self-pay | Admitting: Internal Medicine

## 2021-06-02 NOTE — Telephone Encounter (Signed)
Pt has upcoming appointment next week. Maxide refilled.

## 2021-06-02 NOTE — Telephone Encounter (Signed)
Patient overdue for f/u [facility]CT[facility] to schedule Needs Maxide refilled.

## 2021-06-03 ENCOUNTER — Other Ambulatory Visit: Payer: Self-pay | Admitting: Family Medicine

## 2021-06-03 DIAGNOSIS — D869 Sarcoidosis, unspecified: Secondary | ICD-10-CM

## 2021-06-06 ENCOUNTER — Ambulatory Visit: Payer: Medicare HMO | Admitting: Internal Medicine

## 2021-06-06 NOTE — Progress Notes (Deleted)
Subjective:    Patient ID: [name], female   DOB: 10-19-1961    MRN: [id number]    Brief patient profile:  59yobf quit smoking 10/2004 dx by transbronchial biopsy 5/92 with NCG  consistent with sarcoid. Since that time she's been off and on prednisone multiple  times with flares of coughing dyspnea and skin involvement > weaned off chronic prednisone Feb 2012 and placed back on 10/10/16 for deteriorating obstructive changes on pfts  With resp failure/ 02 dep        History of Present Illness  02/20/2017  f/u ov/Breck Hollinger re:   GOLD III/ pred 10 mg daily and bevespi 2bid  and alb rare  Chief Complaint  Patient presents with   Follow-up    6wk rov. pt states breathing is baseline. pt reports of sob with exertion & occ prod cough with green mucus mainly in the morning   Doe better :  [facility] = can't walk a nl pace on a flat grade s sob but does fine slow and flat eg shopping on  2lpm / ok at rest s 02  Uses 02 2lpm sleeping  rec Drop prednisone to 10 mg one half daily      NP eval 05/14/17  We will decrease your prednisone to 2.5 mg daily. We will prescribe 5 mg tablets. We will renew your prescription for Symbicort.  2 puffs twice daily. Rinse mouth after use. Follow up appointment with Dr. Melvyn Novas in 2 months. We will refer you to Pulmonary rehab > started Aug 2018  Referral to a dietician for weight loss >  at rehab     Date of Admission: 09/07/2017                    Date of Discharge: 09/09/17     Discharge Diagnoses/Problem List:  COPD GOLD stage IV HTN Anemia HLD Breast cancer s/p resection Sarcoidosis GERD       11/26/2017  f/u ov/Vincentina Sollers re:  Sarcoid/skin involvement chronic cough ? Sinus dz/ using med calendar well / pednisone 10 mg daily  Chief Complaint  Patient presents with   Follow-up    Cough has improved some, but still producing some greenish yellow sputum first thing in the am.  She has not had to use her albuterol inhaler. She c/o shakiness in her hands  for the past couple of months.    less cough p augmentin but still am sputum green / no noct symptoms Doing well with rehab but sometimes has to turn 02 up to 4 lpm there but doesn't do so in other settings and concerned about wt gain  rec Start plaquenil 200 mg daily  Ok to adjust the prednisone 22m  from 1-2 pills in am as per med calendar See calendar for specific medication instructions    01/10/2018  f/u ov/Mieshia Pepitone re:  Sarcoid/ plaquenil 200 mg daily plus pred 5 mg daily / last time off was one year prior/ 02 = 2lpm floor and 4lpm with ex Chief Complaint  Patient presents with   Follow-up    repeat LFT's today. She states she has noticed some shaking in her hands for a while now and forgot to mention this at last ov. Her breathing is doing well and she is down to 5 mg pred.   Dyspnea:  Ex bike x 15-30 min / low resistance and 02 up to 3-4lpm sats in mid 90s Cough: no Sleep: on 2lpm / one pillow  SABA use:  Not needing  rec Try prednisone 5 mg  Reduce to one half daily to see if not doing as well and if not increase prednisone to 5 mg and call me for higher dose of plaquenil  Late add: with esr high and lfts ok rec bid plaquenil and return in one month for pfts/lfts> could not tol 400 mg daily due to n and v but ok on 200 mg daily      05/21/2018  f/u ov/Miriam Liles re:  codp GOLD IV/ 02 dep on pred 5 mg x 2  daily for sarcoid / intol of plaquenil > 200 mg (N/V) Using med cal well  Chief Complaint  Patient presents with   Follow-up    Breathing unchanged since the last visit. She has prod cough in the am's with min green sputum. She is using her albuterol inhaler 1 x per wk on average.   Dyspnea:  On 3-4 lpm does [employer] / also able to do ex bike x 15 min  Cough: just in am , slt discolored mucus  SABA use: rare, usually if goes out in hear  02: 2lpm hs and 3-4 lpm daytime   rec Please see patient coordinator before you leave today  to schedule referral to dermatology  No change in  medications except use prednisone 5 mg  X 2 when breathing/ coughing worse then 1 daily thereafter Follow the medication calendar we reviewed today    11/22/2018  f/u ov/Deklan Minar re:  Sarcooid/ copd/ 02 dep resp failure/ indolent onset x 3  weeks no appetite fatigue  Increased sob on pred 5 mg daily / no med calendar  Chief Complaint  Patient presents with   Follow-up    Pt c/o increased SOB and cough the past few days. Her cough is prod at times with yellow to clear sputum. She states her mouth is very dry in the mornings. Her appetite is poor and she is down 9 lbs since the last visit. She is using her albuterol inhaler 2 x per wk on average.    Dyspnea:  mb and back flat ok on 3lpm Pulsed when walks  Cough: none Sleeping: on side bed is horizontal SABA use: couple of times a week 02: 2lpm and up to 3lpm walk rec Please remember to go to the lab department   for your tests - we will call you with the results when they are available. Take 20 prednisone daily until better then 10 mg per day and w/in 3 days got worse more /sob/ decreased sats so admit 12/17/18   Admit date: 12/17/2018 Discharge date: 12/22/2018     DISCHARGE DIAGNOSES:  Multifocal pneumonia likely community-acquired Acute COPD exacerbation, improved Acute on chronic respiratory failure with hypoxia History of sarcoidosis on chronic steroids Acute kidney injury, resolved Essential hypertension History of breast cancer   12/30/2018  f/u ov/Maryalice Pasley re: post hosp eval / sarcoid on plaquenil and floor of pred 5 mg daily/ 02 2lpm - 3lpm with activity / copd on stiolto Chief Complaint  Patient presents with   Hospitalization Follow-up    Breathing has improved almost back to her normal baseline. She is coughing with minimal clear sputum. She has been using her albuterol inhaler about once per wk and she has used neb x 3 since hospital d/c.    Dyspnea:  MMRC3 = can't walk 100 yards even at a slow pace at a flat grade s stopping due to  sob   Cough: minimal clear  Sleeping: bed  is flat/ props up on 2 pillows SABA use: as abov3 02:  2lpm 24/7  rec Prednisone 10 mg 2 daily until better, then 1daily  Please remember to go to the  x-ray department  for your tests - we will call you with the results when they are available    11/17/2019  f/u ov/Yesmin Mutch re: sarcoid on plaquenil 200 as could not tol 400/day and pred 5 mg daily  Chief Complaint  Patient presents with   Follow-up    COPD, Sarcoidosis, SOB, cough   Dyspnea:  Not limited by breathing from desired activities / uses HC parking though and very slow pace on 02  = MMRC2 = can't walk a nl pace on a flat grade s sob but does fine slow and flat   Cough: some in ams  Sleeping: on back, bed is flat, 2 pillows  SABA use: none  02: 2lpm hs, sitting,  When walking 3lpm  rec We will certify you for 02 today  Add pepcid 20 mg at bedtime to see if helps the am cough  If cough or breathing worse, go to 10 mg per day dose until better then resume previous dose  Late add:  No need for 02 at rest if sats >90%    08/26/2020  f/u ov/Ronae Noell re: sarcoid on plaquenil 200 mg pred 5/2.5  And gold iv copd/ 02 dep on stiolto  Chief Complaint  Patient presents with   Follow-up    Breathing is overall doing well. She has occ cough in the am's- prod with clear sputum. She is using her albuterol inhaler about once per wk and has not used neb recently.   Dyspnea:  No outside walking  Cough: minimal throrat clearing only  Sleeping: bed is flat/ 2 pillows  SABA use: rarely any at all and all hfa/ no neb  02: 2lpm hs / up to 3lpm walking but not checking sats at all  Rec Make sure you check your oxygen saturations at highest level of activity to be sure it stays over 90%   06/06/2021  f/u ov/Orpheus Hayhurst re:  No chief complaint on file.   Dyspnea:  *** Cough: *** Sleeping: *** SABA use: *** 02: *** Covid status:   ***   No obvious day to day or daytime variability or assoc excess/ purulent  sputum or mucus plugs or hemoptysis or cp or chest tightness, subjective wheeze or overt sinus or hb symptoms.   *** without nocturnal  or early am exacerbation  of respiratory  c/o's or need for noct saba. Also denies any obvious fluctuation of symptoms with weather or environmental changes or other aggravating or alleviating factors except as outlined above   No unusual exposure hx or h/o childhood pna/ asthma or knowledge of premature birth.  Current Allergies, Complete Past Medical History, Past Surgical History, Family History, and Social History were reviewed in [employer] record.  ROS  The following are not active complaints unless bolded Hoarseness, sore throat, dysphagia, dental problems, itching, sneezing,  nasal congestion or discharge of excess mucus or purulent secretions, ear ache,   fever, chills, sweats, unintended wt loss or wt gain, classically pleuritic or exertional cp,  orthopnea pnd or arm/hand swelling  or leg swelling, presyncope, palpitations, abdominal pain, anorexia, nausea, vomiting, diarrhea  or change in bowel habits or change in bladder habits, change in stools or change in urine, dysuria, hematuria,  rash, arthralgias, visual complaints, headache, numbness, weakness or ataxia or problems with  walking or coordination,  change in mood or  memory.        No outpatient medications have been marked as taking for the 06/06/21 encounter (Appointment) with Tanda Rockers, MD.               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 Unice Cobble  -h/o Daily prednisone since 2007   > off completely Feb 2012 with no problem> restarted  09/2016  ? of SUPERFICIAL VEIN THROMBOSIS (ICD-453.9)   ENDOMETRIAL POLYP (ICD-621.0)   UTERINE FIBROID (ICD-218.9)  RHINITIS, ALLERGIC (ICD-477.9)   HYPERTENSION, BENIGN SYSTEMIC (ICD-401.1)  COPD (ICD-496)  - PFT's 06/26/08 FEV1 1.09 ratio  - PFTs 03/02/10 FEV1 1.06 ratio 34  - PFT's 08/01/2011 FEV1 1.6 (42%)  41 ratio  DLCO 55 corrects 77 - HFA  50% 06/05/2011  > 75% 06/29/2011  - Spiriva trial March 02, 2010 > ? Some better but no worse off 04/2011 - Rehab started mid aug 2018  BACK PAIN, LOW (ICD-724.2)  ANEMIA, IRON DEFICIENCY, UNSPEC. (ICD-280.9)  Health Maintenance.......................................................   [facility]   Family History:  crohn`s in daughter, M-lupus, htn, asthma,  no Ca, DM, CAD    Social History:   lives with twin children and grandson. single; >20 pack year history, quit in 2005; no EtOH  Laid off  from works for [employer] Sept 2013           Objective:   Physical Exam  06/06/2021    *** 08/26/2020  219  11/17/2019     223   12/30/2018  188  wt 166 Oct 12, 2009 >170 October 28, 2010 > 169 06/29/2011 >>171 08/24/2011 > 01/15/2012  171 > 04/24/2012 176 > 06/28/2012  175> 08/13/2012 > 08/30/2012  173 > 170 11/28/2012 > 01/29/2013  176 > 174 >173 06/16/2013 > 171 09/18/2013 > 12/19/2013 177 >179 04/13/2014>  04/23/2014 177 > 08/21/2014 184 >182 11/27/2014 > 05/12/2015 177  > 05/11/2016   177 > 10/27/2016  175 > 11/09/2016 182 > 01/09/2017 198 > 02/20/2017  206 >  07/17/2017   209 >  09/03/2017   210 > 09/28/2017  206  > 11/26/2017   209 > 01/10/2018  210> 02/19/2018  206> 05/21/2018  198 > 08/06/2018  197 > 08/21/2018  196 > 11/22/2018  187     Vital signs reviewed  06/06/2021  - Note at rest 02 sats  ***% on ***   General appearance:    ***   Reports edentulous    Mild bar***       Assessment:

## 2021-06-14 ENCOUNTER — Other Ambulatory Visit: Payer: Self-pay

## 2021-06-14 DIAGNOSIS — Z170 Estrogen receptor positive status [ER+]: Secondary | ICD-10-CM

## 2021-06-14 DIAGNOSIS — C50411 Malignant neoplasm of upper-outer quadrant of right female breast: Secondary | ICD-10-CM

## 2021-06-14 MED ORDER — ANASTROZOLE 1 MG PO TABS: 1.0000 mg | ORAL_TABLET | Freq: Every day | ORAL | 3 refills | Status: DC

## 2021-06-14 MED ORDER — GABAPENTIN 300 MG PO CAPS: 600.0000 mg | ORAL_CAPSULE | Freq: Every day | ORAL | 3 refills | Status: DC

## 2021-06-25 ENCOUNTER — Other Ambulatory Visit: Payer: Self-pay | Admitting: *Deleted

## 2021-06-25 MED ORDER — FLUTICASONE PROPIONATE 50 MCG/ACT NA SUSP: NASAL | 0 refills | Status: DC

## 2021-07-28 ENCOUNTER — Encounter: Payer: Self-pay | Admitting: Internal Medicine

## 2021-07-28 ENCOUNTER — Ambulatory Visit (INDEPENDENT_AMBULATORY_CARE_PROVIDER_SITE_OTHER): Payer: Medicare Other

## 2021-07-28 ENCOUNTER — Ambulatory Visit (INDEPENDENT_AMBULATORY_CARE_PROVIDER_SITE_OTHER): Payer: Medicare Other | Admitting: Internal Medicine

## 2021-07-28 ENCOUNTER — Other Ambulatory Visit: Payer: Self-pay

## 2021-07-28 DIAGNOSIS — J449 Chronic obstructive pulmonary disease, unspecified: Secondary | ICD-10-CM | POA: Diagnosis not present

## 2021-07-28 DIAGNOSIS — J9612 Chronic respiratory failure with hypercapnia: Secondary | ICD-10-CM

## 2021-07-28 DIAGNOSIS — D869 Sarcoidosis, unspecified: Secondary | ICD-10-CM

## 2021-07-28 DIAGNOSIS — J9611 Chronic respiratory failure with hypoxia: Secondary | ICD-10-CM

## 2021-07-28 LAB — CBC WITH DIFFERENTIAL/PLATELET
Basophils Absolute: 0 10*3/uL (ref 0.0–0.1)
Basophils Relative: 0.2 % (ref 0.0–3.0)
Eosinophils Absolute: 0.3 10*3/uL (ref 0.0–0.7)
Eosinophils Relative: 3 % (ref 0.0–5.0)
HCT: 31.5 % — ABNORMAL LOW (ref 36.0–46.0)
Hemoglobin: 9.9 g/dL — ABNORMAL LOW (ref 12.0–15.0)
Lymphocytes Relative: 20 % (ref 12.0–46.0)
Lymphs Abs: 1.7 10*3/uL (ref 0.7–4.0)
MCHC: 31.6 g/dL (ref 30.0–36.0)
MCV: 77.8 fl — ABNORMAL LOW (ref 78.0–100.0)
Monocytes Absolute: 0.7 10*3/uL (ref 0.1–1.0)
Monocytes Relative: 8.2 % (ref 3.0–12.0)
Neutro Abs: 5.9 10*3/uL (ref 1.4–7.7)
Neutrophils Relative %: 68.6 % (ref 43.0–77.0)
Platelets: 282 10*3/uL (ref 150.0–400.0)
RBC: 4.05 Mil/uL (ref 3.87–5.11)
RDW: 16.4 % — ABNORMAL HIGH (ref 11.5–15.5)
WBC: 8.5 10*3/uL (ref 4.0–10.5)

## 2021-07-28 LAB — SEDIMENTATION RATE: Sed Rate: 69 mm/hr — ABNORMAL HIGH (ref 0–30)

## 2021-07-28 LAB — BASIC METABOLIC PANEL
BUN: 14 mg/dL (ref 6–23)
CO2: 37 mEq/L — ABNORMAL HIGH (ref 19–32)
Calcium: 9.3 mg/dL (ref 8.4–10.5)
Chloride: 98 mEq/L (ref 96–112)
Creatinine, Ser: 0.95 mg/dL (ref 0.40–1.20)
GFR: 64.62 mL/min (ref >60.00)
Glucose, Bld: 87 mg/dL (ref 70–99)
Potassium: 3.3 mEq/L — ABNORMAL LOW (ref 3.5–5.1)
Sodium: 142 mEq/L (ref 135–145)

## 2021-07-28 LAB — HEPATIC FUNCTION PANEL
ALT: 10 U/L (ref 0–35)
AST: 15 U/L (ref 0–37)
Albumin: 3.9 g/dL (ref 3.5–5.2)
Alkaline Phosphatase: 76 U/L (ref 39–117)
Bilirubin, Direct: 0 mg/dL (ref 0.0–0.3)
Total Bilirubin: 0.4 mg/dL (ref 0.2–1.2)
Total Protein: 7.6 g/dL (ref 6.0–8.3)

## 2021-07-28 MED ORDER — AZITHROMYCIN 250 MG PO TABS: ORAL_TABLET | ORAL | 0 refills | Status: DC

## 2021-07-28 MED ORDER — PREDNISONE 5 MG PO TABS: ORAL_TABLET | ORAL | 3 refills | Status: DC

## 2021-07-28 NOTE — Patient Instructions (Addendum)
Zpak should clear the green mucus   When breathing / cough bad > prednisone  5 mg x 2 until better then work back to 2.5 mg daily   Make sure you check your oxygen saturation  at your highest level of activity  to be sure it stays over 90% and adjust  02 flow upward to maintain this level if needed but remember to turn it back to previous settings when you stop (to conserve your supply).   Get the new booster for covid 19   Please remember to go to the lab and x-ray department  for your tests - we will call you with the results when they are available.        Please schedule a follow up visit in 6  months but call sooner if needed

## 2021-07-28 NOTE — Progress Notes (Signed)
Subjective:    Patient ID: [name], female   DOB: 1961-04-26    MRN: [id number]    Brief patient profile:  55  yobf quit smoking 10/2004 dx by transbronchial biopsy 5/92 with NCG  consistent with sarcoid. Since that time she's been off and on prednisone multiple  times with flares of coughing dyspnea and skin involvement > weaned off chronic prednisone Feb 2012 and placed back on 10/10/16 for deteriorating obstructive changes on pfts  With resp failure/ 02 dep        History of Present Illness  02/20/2017  f/u ov/[name] re:   GOLD III/ pred 10 mg daily and bevespi 2bid  and alb rare  Chief Complaint  Patient presents with   Follow-up    6wk rov. pt states breathing is baseline. pt reports of sob with exertion & occ prod cough with green mucus mainly in the morning   Doe better :  [facility] = can't walk a nl pace on a flat grade s sob but does fine slow and flat eg shopping on  2lpm / ok at rest s 02  Uses 02 2lpm sleeping  rec Drop prednisone to 10 mg one half daily      NP eval 05/14/17  We will decrease your prednisone to 2.5 mg daily. We will prescribe 5 mg tablets. We will renew your prescription for Symbicort.  2 puffs twice daily. Rinse mouth after use. Follow up appointment with Dr. Melvyn Novas in 2 months. We will refer you to Pulmonary rehab > started Aug 2018  Referral to a dietician for weight loss >  at rehab     Date of Admission: 09/07/2017                    Date of Discharge: 09/09/17     Discharge Diagnoses/Problem List:  COPD GOLD stage IV HTN Anemia HLD Breast cancer s/p resection Sarcoidosis GERD       11/26/2017  f/u ov/[name] re:  Sarcoid/skin involvement chronic cough ? Sinus dz/ using med calendar well / pednisone 10 mg daily  Chief Complaint  Patient presents with   Follow-up    Cough has improved some, but still producing some greenish yellow sputum first thing in the am.  She has not had to use her albuterol inhaler. She c/o shakiness in her  hands for the past couple of months.    less cough p augmentin but still am sputum green / no noct symptoms Doing well with rehab but sometimes has to turn 02 up to 4 lpm there but doesn't do so in other settings and concerned about wt gain  rec Start plaquenil 200 mg daily  Ok to adjust the prednisone 76m  from 1-2 pills in am as per med calendar See calendar for specific medication instructions    01/10/2018  f/u ov/[name] re:  Sarcoid/ plaquenil 200 mg daily plus pred 5 mg daily / last time off was one year prior/ 02 = 2lpm floor and 4lpm with ex Chief Complaint  Patient presents with   Follow-up    repeat LFT's today. She states she has noticed some shaking in her hands for a while now and forgot to mention this at last ov. Her breathing is doing well and she is down to 5 mg pred.   Dyspnea:  Ex bike x 15-30 min / low resistance and 02 up to 3-4lpm sats in mid 90s Cough: no Sleep: on 2lpm / one pillow  SABA  use:  Not needing  rec Try prednisone 5 mg  Reduce to one half daily to see if not doing as well and if not increase prednisone to 5 mg and call me for higher dose of plaquenil  Late add: with esr high and lfts ok rec bid plaquenil and return in one month for pfts/lfts> could not tol 400 mg daily due to n and v but ok on 200 mg daily      05/21/2018  f/u ov/[name] re:  codp GOLD IV/ 02 dep on pred 5 mg x 2  daily for sarcoid / intol of plaquenil > 200 mg (N/V) Using med cal well  Chief Complaint  Patient presents with   Follow-up    Breathing unchanged since the last visit. She has prod cough in the am's with min green sputum. She is using her albuterol inhaler 1 x per wk on average.   Dyspnea:  On 3-4 lpm does [employer] / also able to do ex bike x 15 min  Cough: just in am , slt discolored mucus  SABA use: rare, usually if goes out in hear  02: 2lpm hs and 3-4 lpm daytime   rec Please see patient coordinator before you leave today  to schedule referral to dermatology  No change  in medications except use prednisone 5 mg  X 2 when breathing/ coughing worse then 1 daily thereafter Follow the medication calendar we reviewed today    11/22/2018  f/u ov/[name] re:  Sarcooid/ copd/ 02 dep resp failure/ indolent onset x 3  weeks no appetite fatigue  Increased sob on pred 5 mg daily / no med calendar  Chief Complaint  Patient presents with   Follow-up    Pt c/o increased SOB and cough the past few days. Her cough is prod at times with yellow to clear sputum. She states her mouth is very dry in the mornings. Her appetite is poor and she is down 9 lbs since the last visit. She is using her albuterol inhaler 2 x per wk on average.    Dyspnea:  mb and back flat ok on 3lpm Pulsed when walks  Cough: none Sleeping: on side bed is horizontal SABA use: couple of times a week 02: 2lpm and up to 3lpm walk rec Please remember to go to the lab department   for your tests - we will call you with the results when they are available. Take 20 prednisone daily until better then 10 mg per day and w/in 3 days got worse more /sob/ decreased sats so admit 12/17/18   Admit date: 12/17/2018 Discharge date: 12/22/2018     DISCHARGE DIAGNOSES:  Multifocal pneumonia likely community-acquired Acute COPD exacerbation, improved Acute on chronic respiratory failure with hypoxia History of sarcoidosis on chronic steroids Acute kidney injury, resolved Essential hypertension History of breast cancer   12/30/2018  f/u ov/[name] re: post hosp eval / sarcoid on plaquenil and floor of pred 5 mg daily/ 02 2lpm - 3lpm with activity / copd on stiolto Chief Complaint  Patient presents with   Hospitalization Follow-up    Breathing has improved almost back to her normal baseline. She is coughing with minimal clear sputum. She has been using her albuterol inhaler about once per wk and she has used neb x 3 since hospital d/c.    Dyspnea:  MMRC3 = can't walk 100 yards even at a slow pace at a flat grade s stopping due  to sob   Cough: minimal clear  Sleeping: bed is flat/ props up on 2 pillows SABA use: as abov3 02:  2lpm 24/7  rec Prednisone 10 mg 2 daily until better, then 1daily  Please remember to go to the  x-ray department  for your tests - we will call you with the results when they are available    11/17/2019  f/u ov/[name] re: sarcoid on plaquenil 200 as could not tol 400/day and pred 5 mg daily  Chief Complaint  Patient presents with   Follow-up    COPD, Sarcoidosis, SOB, cough   Dyspnea:  Not limited by breathing from desired activities / uses HC parking though and very slow pace on 02  = MMRC2 = can't walk a nl pace on a flat grade s sob but does fine slow and flat   Cough: some in ams  Sleeping: on back, bed is flat, 2 pillows  SABA use: none  02: 2lpm hs, sitting,  When walking 3lpm  rec We will certify you for 02 today  Add pepcid 20 mg at bedtime to see if helps the am cough  If cough or breathing worse, go to 10 mg per day dose until better then resume previous dose  Late add:  No need for 02 at rest if sats >90%    08/26/2020  f/u ov/[name] re: sarcoid on plaquenil 200 mg pred 5/2.5  And gold iv copd/ 02 dep on stiolto  Chief Complaint  Patient presents with   Follow-up    Breathing is overall doing well. She has occ cough in the am's- prod with clear sputum. She is using her albuterol inhaler about once per wk and has not used neb recently.   Dyspnea:  No outside walking  Cough: minimal throrat clearing only  Sleeping: bed is flat/ 2 pillows  SABA use: rarely any at all and all hfa/ no neb  02: 2lpm hs / up to 3lpm walking but not checking sats at all  Rec Make sure you check your oxygen saturations at highest level of ex and adjust for sats > 90% goal    07/28/2021  f/u ov/[name] re: sarcoid / gold 4 copd/ 02 dep  maint on stiolto x 2/ pred 5-2.5 -5   Chief Complaint  Patient presents with   Follow-up    Breathing is overall doing well. She uses her albuterol inhaler about  1-2 x per week. She rarely uses neb. She has been having occ cough, esp in the am with thick, green sputum.   Dyspnea:  grocery shopping [employer] with sats  typically above 90% on 2 POC  Cough: green x one week, getting better with increase pred to 5 mg from 5-2.5-5  Sleeping: bed is flat, 2 pillows  SABA use: as above 02: 2lpm Covid status:   vax x 2, never infected  Mouth dry "feels like a rash"    No obvious day to day or daytime variability or assoc excess/ purulent sputum or mucus plugs or hemoptysis or cp or chest tightness, subjective wheeze or overt sinus or hb symptoms.   Sleeping as above without nocturnal  or early am exacerbation  of respiratory  c/o's or need for noct saba. Also denies any obvious fluctuation of symptoms with weather or environmental changes or other aggravating or alleviating factors except as outlined above   No unusual exposure hx or h/o childhood pna/ asthma or knowledge of premature birth.  Current Allergies, Complete Past Medical History, Past Surgical History, Family History, and Social History  were reviewed in [facility] record.  ROS  The following are not active complaints unless bolded Hoarseness, sore throat, dysphagia, dental problems, itching, sneezing,  nasal congestion or discharge of excess mucus or purulent secretions, ear ache,   fever, chills, sweats, unintended wt loss or wt gain, classically pleuritic or exertional cp,  orthopnea pnd or arm/hand swelling  or leg swelling, presyncope, palpitations, abdominal pain, anorexia, nausea, vomiting, diarrhea  or change in bowel habits or change in bladder habits, change in stools or change in urine, dysuria, hematuria,  rash, arthralgias, visual complaints, headache, numbness, weakness or ataxia or problems with walking or coordination,  change in mood or  memory.        Current Meds  Medication Sig   albuterol (PROVENTIL) (2.5 MG/3ML) 0.083% nebulizer solution Take 3 mLs (2.5  mg total) by nebulization every 6 (six) hours as needed for wheezing or shortness of breath.   albuterol (VENTOLIN HFA) 108 (90 Base) MCG/ACT inhaler INHALE 2 PUFFS EVERY 6 HOURS AS NEEDED FOR WHEEZING OR SHORTNESS OF BREATH   anastrozole (ARIMIDEX) 1 MG tablet Take 1 tablet (1 mg total) by mouth daily.   aspirin EC 81 MG tablet Take 1 tablet (81 mg total) by mouth daily. Start 02/23/19   atorvastatin (LIPITOR) 20 MG tablet TAKE 1 TABLET (20 MG TOTAL) BY MOUTH DAILY.   ferrous sulfate 325 (65 FE) MG tablet Take 1 tablet (325 mg total) by mouth daily with breakfast. (Patient taking differently: Take 325 mg by mouth every other day.)   fluticasone (FLONASE) 50 MCG/ACT nasal spray USE 2 SPRAYS IN BOTH NOSTRILS DAILY AS NEEDED FOR ALLERGIES   gabapentin (NEURONTIN) 300 MG capsule Take 2 capsules (600 mg total) by mouth at bedtime. 1 tab in Am and 2 tabs in PM   Guaifenesin 1200 MG TB12 Take 1,200 mg by mouth 2 (two) times daily as needed (chest congestion).    hydroxychloroquine (PLAQUENIL) 200 MG tablet TAKE 1 TABLET EVERY DAY   meloxicam (MOBIC) 7.5 MG tablet Take 1 tablet (7.5 mg total) by mouth daily.   methocarbamol (ROBAXIN) 500 MG tablet Take 1 tablet (500 mg total) by mouth 2 (two) times daily.   metoprolol tartrate (LOPRESSOR) 25 MG tablet Take 1 tablet (25 mg total) by mouth 2 (two) times daily.   Multiple Vitamin (MULTIVITAMIN) capsule Take 1 capsule by mouth daily.   omeprazole (PRILOSEC) 20 MG capsule TAKE 1 CAPSULE EVERY DAY   OXYGEN 2lpm with rest and 3lpm with exertion   predniSONE (DELTASONE) 5 MG tablet TAKE 1 TAB DAILY ALTERNATING WITH 1/2 TABLET EVERY OTHER DAY   sertraline (ZOLOFT) 50 MG tablet TAKE 1 TABLET EVERY DAY   STIOLTO RESPIMAT 2.5-2.5 MCG/ACT AERS INHALE 2 PUFFS INTO THE LUNGS DAILY.   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 Unice Cobble  -h/o Daily prednisone since 2007   > off completely Feb 2012 with no problem> restarted  09/2016  ? 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 Some better but no worse off 04/2011 - Rehab started mid aug 2018  BACK PAIN, LOW (ICD-724.2)  ANEMIA, IRON DEFICIENCY, UNSPEC. (ICD-280.9)  Health Maintenance.......................................................   [facility]   Family History:  crohn`s in daughter, M-lupus, htn, asthma,  no Ca, DM, CAD    Social History:   lives with twin children and grandson. single; >20 pack year history, quit in 2005; no EtOH  Laid off  from works for [employer] Sept 2013           Objective:   Physical Exam  07/28/2021    205  08/26/2020  219  11/17/2019      223   12/30/2018  188  wt 166 Oct 12, 2009 >170 October 28, 2010 > 169 06/29/2011 >>171 08/24/2011 > 01/15/2012  171 > 04/24/2012 176 > 06/28/2012  175> 08/13/2012 > 08/30/2012  173 > 170 11/28/2012 > 01/29/2013  176 > 174 >173 06/16/2013 > 171 09/18/2013 > 12/19/2013 177 >179 04/13/2014>  04/23/2014 177 > 08/21/2014 184 >182 11/27/2014 > 05/12/2015 177  > 05/11/2016   177 > 10/27/2016  175 > 11/09/2016 182 > 01/09/2017 198 > 02/20/2017  206 >  07/17/2017   209 >  09/03/2017   210 > 09/28/2017  206  > 11/26/2017   209 > 01/10/2018  210> 02/19/2018  206> 05/21/2018  198 > 08/06/2018  197 > 08/21/2018  196 > 11/22/2018  187     Vital signs reviewed  07/28/2021  - Note at rest 02 sats  95% on 2lpm POC    General appearance:    pleasant amb bf nad    Reports edentulous    HEENT : edentulous, no evidence of stomatitis/ candidiasis     NECK :  without JVD/Nodes/TM/ nl carotid upstrokes bilaterally   LUNGS: no acc muscle use,  Mild barrel  contour chest wall with bilateral distant exp  wheeze and  without cough on insp or exp maneuvers  and mild  Hyperresonant  to  percussion bilaterally     CV:  RRR  no s3 or murmur or increase in P2, and no edema   ABD:  soft and nontender with pos end  insp Hoover's  in the supine position. No bruits or organomegaly appreciated, bowel sounds nl  MS:   Nl gait/  ext warm without deformities, calf tenderness, cyanosis or clubbing No obvious joint restrictions   SKIN: warm and dry without lesions    NEURO:  alert, approp, nl sensorium with  no motor or cerebellar deficits apparent.         CXR PA and Lateral:   07/28/2021 :    I personally reviewed images and agree with radiology impression as follows:     Unchanged appearance of chest x-ray, with sequela of sarcoidosis/emphysema and no definite evidence of superimposed acute cardiopulmonary disease   Labs ordered/ reviewed:      Chemistry      Component Value Date/Time   NA 142 07/28/2021 1019   NA 146 (H) 01/10/2019 1220   K 3.3 (L) 07/28/2021 1019   CL 98 07/28/2021 1019   CO2 37 (H) 07/28/2021 1019   BUN 14 07/28/2021 1019   BUN 21 01/10/2019 1220   CREATININE 0.95 07/28/2021 1019   CREATININE 0.64 02/04/2015 1619  Component Value Date/Time   CALCIUM 9.3 07/28/2021 1019   ALKPHOS 76 07/28/2021 1019   AST 15 07/28/2021 1019   ALT 10 07/28/2021 1019   BILITOT 0.4 07/28/2021 1019   BILITOT 0.4 02/05/2017 1530        Lab Results  Component Value Date   WBC 8.5 07/28/2021   HGB 9.9 (L) 07/28/2021   HCT 31.5 (L) 07/28/2021   MCV 77.8 (L) 07/28/2021   PLT 282.0 07/28/2021         Lab Results  Component Value Date   ESRSEDRATE 69 (H) 07/28/2021   ESRSEDRATE  48 (H) 11/17/2019   ESRSEDRATE 113 (H) 11/22/2018       Assessment:

## 2021-07-29 ENCOUNTER — Encounter: Payer: Self-pay | Admitting: Internal Medicine

## 2021-07-29 LAB — ANGIOTENSIN CONVERTING ENZYME: Angiotensin-Converting Enzyme: 38 U/L (ref 9–67)

## 2021-07-29 NOTE — Assessment & Plan Note (Signed)
Positive transbronchial biopsy 02/19/91 by Dr. William Hopper  -Daily prednisone since 2007 with ? adrenal insufficiency iatrogenic > off completely Feb 2012 with no recurrence - PFT's 06/26/08 FEV1  1.09 ratio  - PFTs  03/02/10 FEV1  1.06 (42%) ratio 34 and no better p B2,  DLC0 48% corrects to 64% - PFT's 08/01/2011 FEV1 1.6 (42%)  41 ratio  DLCO 55 corrects 77 - PFTs 10/10/16    FEV1  0.87(38)ratio 41 and DLCO 36/39c corrects to 51 for alv vol > placed back on daily pred  - 02/20/2017 decreased pred to 5 mg daily > to 2.5mg daily as of 07/17/2017  - 07/17/2017 try 2.5 mg qod > 09/28/2017 changed to 20 mg until better and new floor of 5 mg daily plus consider plaquenil due to new skin involvement on face  - opth eval neg 11/10/17 - Start plaquenil 200 mg daily 11/26/2017  - 01/10/2018 ESR up to 97 with pred taper so rec bid plaquenil > could only tol 200 mg daily  - 02/19/2018 ESR = 94 on 5mg daily  - 05/21/2018 referred to dermatology  - 08/21/2018   ESR = 80  On prednisone 10 mg/ plaquenil 200 mg daily > tapered to floor of 5 mg daily  - 11/22/2018  ESR  = 113 on pred 5 mg and plaq 200 mg with calcium 12.1 so rec pred 20 mg ub then new floor of 10 mg daily  - 02/24/2019 clinically improved and doing fine on Plaquenil 200 plus pred  10 mg daily so rec rechalleng with slow taper to 5 mg daily    The goal with a chronic steroid dependent illness is always arriving at the lowest effective dose that controls the disease/symptoms and not accepting a set "formula" which is based on statistics or guidelines that don't always take into account patient  variability or the natural hx of the dz in every individual patient, which may well vary over time.  For now therefore I recommend the patient maintain  Ceiling of 10 mg daily and floor of 2.5 mg daily and continue plaquenil at 200 mg daily    >>> f/u q 6 m    

## 2021-07-29 NOTE — Assessment & Plan Note (Signed)
Quit smoking 2005  - PFT's 06/26/08 FEV1 1.09 ratio  - PFTs 03/02/10 FEV1 1.06 ratio 34    PFT's 08/01/2011 FEV1 1.6 (42%)  41 ratio  DLCO 55 corrects 77 - Spiriva trial March 02, 2010 > ? better but not worse off it 04/2011 - 05/12/2015 p extensive coaching HFA effectiveness =    90%  - Referred to Rehab 08/21/2014 > could not arrange due to schedule  -med calendar 06/16/2013 > did not bring to office as requested 12/19/13 or 04/23/14  Or 08/21/2014  - 11/09/2016    changed symb to bevespi since taking chronic pred for pf    - 02/20/2017 decreased pred to 5 mg daily > weaned to 2.5 mg qd as of 07/17/2017  - started rehab mid Aug 2018  - 07/17/2017 changed to pred 2.5 even days > did not tolerate so changed  Around  07/28/17 back to 5 mg qod  - Spirometry 09/03/2017  FEV1 0.52 (23%)  Ratio 40 with typical curvature   - PFT's  02/19/2018  FEV1 0.65 (29 % ) ratio 43  p 1 % improvement from saba p stiolto/ pred 5 mg  prior to study with DLCO  39 % corrects to 56  % for alv volume - 12/30/2018  After extensive coaching inhaler device,  effectiveness =    95%   With smi > continue stiolto   Mouth dryness may be stiolto but for now seems well tol - having mild bronchitis exac so rec zpak and f/u prn if not back to baseline and less need for saba

## 2021-07-29 NOTE — Progress Notes (Signed)
Spoke with pt and notified of results per Dr. Melvyn Novas. Pt verbalized understanding and denied any questions. Copy sent to Dr Dustin Folks.

## 2021-07-29 NOTE — Assessment & Plan Note (Signed)
New start 09/2016 at admit see records - 10/18/16 Patient Saturations on Room Air at Rest = 92% Patient Saturations on Room Air while Ambulating = 84% Patient Saturations on 2 Liters of oxygen while Ambulating = 90% - HCO3  11/09/2016 = 39  - HCO3   03/16/17        = 34  - HC03    01/10/2018  = 40  - HC03  11/22/2018     = 38  - HC03  12/20/2018       = 26  - HC03  11/17/2019       = 37    11/17/2019 Patient Saturations on Room Air at Rest = 97% Patient Saturations on Room Air while Ambulating = 79% Patient Saturations on 2 Liters of oxygen while Ambulating = 90%  As of  07/28/2021  rec 2lpm sleeping  and titrate with ambulation to maintain > 90%   No change rx    f/u aq 6 m      Each maintenance medication was reviewed in detail including emphasizing most importantly the difference between maintenance and prns and under what circumstances the prns are to be triggered using an action plan format where appropriate.  Total time for H and P, chart review, counseling, reviewing smi/02 device(s) and generating customized AVS unique to this office visit / same day charting = 31 min

## 2021-08-02 ENCOUNTER — Telehealth: Payer: Self-pay

## 2021-08-02 DIAGNOSIS — D869 Sarcoidosis, unspecified: Secondary | ICD-10-CM

## 2021-08-02 MED ORDER — HYDROXYCHLOROQUINE SULFATE 200 MG PO TABS: 200.0000 mg | ORAL_TABLET | Freq: Every day | ORAL | 3 refills | Status: DC

## 2021-08-02 NOTE — Telephone Encounter (Signed)
Refill request sent over from the pharmacy for Plaquenil 200 mg, 1 tablet daily. Originally filled by Dr. Zola Button. Is it ok to refill. Patient was seen in office on 07/28/21.  Patient uses [facility].  Dr. Melvyn Novas please advise  Thanks

## 2021-08-02 NOTE — Telephone Encounter (Signed)
Ok x one year supply

## 2021-08-05 ENCOUNTER — Telehealth: Payer: Self-pay | Admitting: Internal Medicine

## 2021-08-05 MED ORDER — AMOXICILLIN-POT CLAVULANATE 875-125 MG PO TABS: 1.0000 | ORAL_TABLET | Freq: Two times a day (BID) | ORAL | 0 refills | Status: DC

## 2021-08-05 NOTE — Telephone Encounter (Signed)
I called and spoke with patient who stated she finished Zpak and is still having lots of head and chest congestion. She is still coughing up green mucous as well and just feeling drained. She is currently taking prednisone 10mg  daily, Stiolto, Flonase and using neb treatment daily. She is wondering if she needs to go up on prednisone. She stated the mucous is clearer but still green looking. Will route to Dr. Melvyn Novas.  DR. Melvyn Novas, please advise. Thanks!

## 2021-08-05 NOTE — Telephone Encounter (Signed)
Called and spoke with patient. She verbalized understanding about the abx and prednisone. I will send the abx to [facility].   She is aware to call us back next week if she is not feeling any better.   Nothing further needed at time of call.

## 2021-08-05 NOTE — Telephone Encounter (Signed)
Augmentin 875 mg take one pill twice daily  X 10 days - take at breakfast and supper with large glass of water.  It would help reduce the usual side effects (diarrhea and yeast infections) if you ate cultured yogurt at lunch.   Double prednisone until better then back to prior dose

## 2021-08-15 ENCOUNTER — Other Ambulatory Visit: Payer: Self-pay | Admitting: Internal Medicine

## 2021-09-05 ENCOUNTER — Other Ambulatory Visit: Payer: Medicare HMO

## 2021-10-17 ENCOUNTER — Telehealth: Payer: Self-pay | Admitting: Internal Medicine

## 2021-10-18 NOTE — Telephone Encounter (Signed)
Called and spoke with Patient.  Patient stated she is due for a prednisone refill with [facility].  Patient stated she normally gets a refill for 100, with additional refills.    Message routed to Dr. Melvyn Novas to advise to continue prednisone # 100 with refills  LOV 07/28/21-  Instructions  Zpak should clear the green mucus    When breathing / cough bad > prednisone  5 mg x 2 until better then work back to 2.5 mg daily    Make sure you check your oxygen saturation  at your highest level of activity  to be sure it stays over 90% and adjust  02 flow upward to maintain this level if needed but remember to turn it back to previous settings when you stop (to conserve your supply).    Get the new booster for covid 19    Please remember to go to the lab and x-ray department  for your tests - we will call you with the results when they are available.

## 2021-10-18 NOTE — Telephone Encounter (Signed)
Ok to refill same rx

## 2021-10-19 MED ORDER — PREDNISONE 5 MG PO TABS: ORAL_TABLET | ORAL | 3 refills | Status: DC

## 2021-10-19 NOTE — Telephone Encounter (Signed)
I have sent refill on the pred  Spoke with the pt and notified that this was done

## 2021-10-26 ENCOUNTER — Telehealth: Payer: Self-pay | Admitting: Internal Medicine

## 2021-10-26 MED ORDER — STIOLTO RESPIMAT 2.5-2.5 MCG/ACT IN AERS: 2.0000 | INHALATION_SPRAY | Freq: Every day | RESPIRATORY_TRACT | 3 refills | Status: DC

## 2021-10-26 NOTE — Telephone Encounter (Signed)
Spoke with the pt  She is requesting refill on her stiolto  I have sent this in and nothing further needed

## 2021-11-16 ENCOUNTER — Other Ambulatory Visit: Payer: Self-pay | Admitting: *Deleted

## 2021-11-16 MED ORDER — STIOLTO RESPIMAT 2.5-2.5 MCG/ACT IN AERS: 2.0000 | INHALATION_SPRAY | Freq: Every day | RESPIRATORY_TRACT | 3 refills | Status: DC

## 2021-11-30 ENCOUNTER — Other Ambulatory Visit: Payer: Self-pay | Admitting: Hematology and Oncology

## 2021-11-30 DIAGNOSIS — Z170 Estrogen receptor positive status [ER+]: Secondary | ICD-10-CM

## 2021-11-30 DIAGNOSIS — C50411 Malignant neoplasm of upper-outer quadrant of right female breast: Secondary | ICD-10-CM

## 2022-01-16 ENCOUNTER — Ambulatory Visit: Payer: Medicare Other | Admitting: Internal Medicine

## 2022-01-16 NOTE — Progress Notes (Unsigned)
Subjective:    Patient ID: [name], female   DOB: [birth year]    MRN: [id number]    Brief patient profile:  75  yobf quit smoking 10/2004 dx by transbronchial biopsy 5/92 with NCG  consistent with sarcoid. Since that time she's been off and on prednisone multiple  times with flares of coughing dyspnea and skin involvement > weaned off chronic prednisone Feb 2012 and placed back on 10/10/16 for deteriorating obstructive changes on pfts  With resp failure/ 02 dep        History of Present Illness  02/20/2017  f/u ov/[name] re:   GOLD III/ pred 10 mg daily and bevespi 2bid  and alb rare  Chief Complaint  Patient presents with   Follow-up    6wk rov. pt states breathing is baseline. pt reports of sob with exertion & occ prod cough with green mucus mainly in the morning   Doe better :  [facility] = can't walk a nl pace on a flat grade s sob but does fine slow and flat eg shopping on  2lpm / ok at rest s 02  Uses 02 2lpm sleeping  rec Drop prednisone to 10 mg one half daily      NP eval 05/14/17  We will decrease your prednisone to 2.5 mg daily. We will prescribe 5 mg tablets. We will renew your prescription for Symbicort.  2 puffs twice daily. Rinse mouth after use. Follow up appointment with Dr. Melvyn Novas in 2 months. We will refer you to Pulmonary rehab > started Aug 2018  Referral to a dietician for weight loss >  at rehab     Date of Admission: 09/07/2017                    Date of Discharge: 09/09/17     Discharge Diagnoses/Problem List:  COPD GOLD stage IV HTN Anemia HLD Breast cancer s/p resection Sarcoidosis GERD       11/26/2017  f/u ov/[name] re:  Sarcoid/skin involvement chronic cough ? Sinus dz/ using med calendar well / pednisone 10 mg daily  Chief Complaint  Patient presents with   Follow-up    Cough has improved some, but still producing some greenish yellow sputum first thing in the am.  She has not had to use her albuterol inhaler. She c/o shakiness in her  hands for the past couple of months.    less cough p augmentin but still am sputum green / no noct symptoms Doing well with rehab but sometimes has to turn 02 up to 4 lpm there but doesn't do so in other settings and concerned about wt gain  rec Start plaquenil 200 mg daily  Ok to adjust the prednisone 61m  from 1-2 pills in am as per med calendar See calendar for specific medication instructions    01/10/2018  f/u ov/[name] re:  Sarcoid/ plaquenil 200 mg daily plus pred 5 mg daily / last time off was one year prior/ 02 = 2lpm floor and 4lpm with ex Chief Complaint  Patient presents with   Follow-up    repeat LFT's today. She states she has noticed some shaking in her hands for a while now and forgot to mention this at last ov. Her breathing is doing well and she is down to 5 mg pred.   Dyspnea:  Ex bike x 15-30 min / low resistance and 02 up to 3-4lpm sats in mid 90s Cough: no Sleep: on 2lpm / one pillow  SABA  use:  Not needing  rec Try prednisone 5 mg  Reduce to one half daily to see if not doing as well and if not increase prednisone to 5 mg and call me for higher dose of plaquenil  Late add: with esr high and lfts ok rec bid plaquenil and return in one month for pfts/lfts> could not tol 400 mg daily due to n and v but ok on 200 mg daily      05/21/2018  f/u ov/[name] re:  codp GOLD IV/ 02 dep on pred 5 mg x 2  daily for sarcoid / intol of plaquenil > 200 mg (N/V) Using med cal well  Chief Complaint  Patient presents with   Follow-up    Breathing unchanged since the last visit. She has prod cough in the am's with min green sputum. She is using her albuterol inhaler 1 x per wk on average.   Dyspnea:  On 3-4 lpm does [employer] / also able to do ex bike x 15 min  Cough: just in am , slt discolored mucus  SABA use: rare, usually if goes out in hear  02: 2lpm hs and 3-4 lpm daytime   rec Please see patient coordinator before you leave today  to schedule referral to dermatology  No change  in medications except use prednisone 5 mg  X 2 when breathing/ coughing worse then 1 daily thereafter Follow the medication calendar we reviewed today    11/22/2018  f/u ov/[name] re:  Sarcooid/ copd/ 02 dep resp failure/ indolent onset x 3  weeks no appetite fatigue  Increased sob on pred 5 mg daily / no med calendar  Chief Complaint  Patient presents with   Follow-up    Pt c/o increased SOB and cough the past few days. Her cough is prod at times with yellow to clear sputum. She states her mouth is very dry in the mornings. Her appetite is poor and she is down 9 lbs since the last visit. She is using her albuterol inhaler 2 x per wk on average.    Dyspnea:  mb and back flat ok on 3lpm Pulsed when walks  Cough: none Sleeping: on side bed is horizontal SABA use: couple of times a week 02: 2lpm and up to 3lpm walk rec Please remember to go to the lab department   for your tests - we will call you with the results when they are available. Take 20 prednisone daily until better then 10 mg per day and w/in 3 days got worse more /sob/ decreased sats so admit 12/17/18   Admit date: 12/17/2018 Discharge date: 12/22/2018     DISCHARGE DIAGNOSES:  Multifocal pneumonia likely community-acquired Acute COPD exacerbation, improved Acute on chronic respiratory failure with hypoxia History of sarcoidosis on chronic steroids Acute kidney injury, resolved Essential hypertension History of breast cancer   12/30/2018  f/u ov/[name] re: post hosp eval / sarcoid on plaquenil and floor of pred 5 mg daily/ 02 2lpm - 3lpm with activity / copd on stiolto Chief Complaint  Patient presents with   Hospitalization Follow-up    Breathing has improved almost back to her normal baseline. She is coughing with minimal clear sputum. She has been using her albuterol inhaler about once per wk and she has used neb x 3 since hospital d/c.    Dyspnea:  MMRC3 = can't walk 100 yards even at a slow pace at a flat grade s stopping due  to sob   Cough: minimal clear  Sleeping: bed is flat/ props up on 2 pillows SABA use: as abov3 02:  2lpm 24/7  rec Prednisone 10 mg 2 daily until better, then 1daily  Please remember to go to the  x-ray department  for your tests - we will call you with the results when they are available    11/17/2019  f/u ov/[name] re: sarcoid on plaquenil 200 as could not tol 400/day and pred 5 mg daily  Chief Complaint  Patient presents with   Follow-up    COPD, Sarcoidosis, SOB, cough   Dyspnea:  Not limited by breathing from desired activities / uses HC parking though and very slow pace on 02  = MMRC2 = can't walk a nl pace on a flat grade s sob but does fine slow and flat   Cough: some in ams  Sleeping: on back, bed is flat, 2 pillows  SABA use: none  02: 2lpm hs, sitting,  When walking 3lpm  rec We will certify you for 02 today  Add pepcid 20 mg at bedtime to see if helps the am cough  If cough or breathing worse, go to 10 mg per day dose until better then resume previous dose  Late add:  No need for 02 at rest if sats >90%    08/26/2020  f/u ov/[name] re: sarcoid on plaquenil 200 mg pred 5/2.5  And gold iv copd/ 02 dep on stiolto  Chief Complaint  Patient presents with   Follow-up    Breathing is overall doing well. She has occ cough in the am's- prod with clear sputum. She is using her albuterol inhaler about once per wk and has not used neb recently.   Dyspnea:  No outside walking  Cough: minimal throrat clearing only  Sleeping: bed is flat/ 2 pillows  SABA use: rarely any at all and all hfa/ no neb  02: 2lpm hs / up to 3lpm walking but not checking sats at all  Rec Make sure you check your oxygen saturations at highest level of ex and adjust for sats > 90% goal    07/28/2021  f/u ov/[name] re: sarcoid / gold 4 copd/ 02 dep  maint on stiolto x 2/ pred 5-2.5 -5   Chief Complaint  Patient presents with   Follow-up    Breathing is overall doing well. She uses her albuterol inhaler about  1-2 x per week. She rarely uses neb. She has been having occ cough, esp in the am with thick, green sputum.   Dyspnea:  grocery shopping [employer] with sats  typically above 90% on 2 POC  Cough: green x one week, getting better with increase pred to 5 mg from 5-2.5-5  Sleeping: bed is flat, 2 pillows  SABA use: as above 02: 2lpm Covid status:   vax x 2, never infected  Mouth dry "feels like a rash"  Rec Zpak should clear the green mucus  When breathing / cough bad > prednisone  5 mg x 2 until better then work back to 2.5 mg daily  Make sure you check your oxygen saturation  at your highest level of activity  to be sure it stays over 90%  Get the new booster for covid 19  Please remember to go to the lab     01/16/2022  f/u ov/[name] re: ***   maint on ***  No chief complaint on file.   Dyspnea:  *** Cough: *** Sleeping: *** SABA use: *** 02: *** Covid status:   ***  No obvious day to day or daytime variability or assoc excess/ purulent sputum or mucus plugs or hemoptysis or cp or chest tightness, subjective wheeze or overt sinus or hb symptoms.   *** without nocturnal  or early am exacerbation  of respiratory  c/o's or need for noct saba. Also denies any obvious fluctuation of symptoms with weather or environmental changes or other aggravating or alleviating factors except as outlined above   No unusual exposure hx or h/o childhood pna/ asthma or knowledge of premature birth.  Current Allergies, Complete Past Medical History, Past Surgical History, Family History, and Social History were reviewed in [employer] record.  ROS  The following are not active complaints unless bolded Hoarseness, sore throat, dysphagia, dental problems, itching, sneezing,  nasal congestion or discharge of excess mucus or purulent secretions, ear ache,   fever, chills, sweats, unintended wt loss or wt gain, classically pleuritic or exertional cp,  orthopnea pnd or arm/hand swelling  or  leg swelling, presyncope, palpitations, abdominal pain, anorexia, nausea, vomiting, diarrhea  or change in bowel habits or change in bladder habits, change in stools or change in urine, dysuria, hematuria,  rash, arthralgias, visual complaints, headache, numbness, weakness or ataxia or problems with walking or coordination,  change in mood or  memory.        No outpatient medications have been marked as taking for the 01/16/22 encounter (Appointment) with Tanda Rockers, MD.                         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 Unice Cobble  -h/o Daily prednisone since 2007   > off completely Feb 2012 with no problem> restarted  09/2016  ? 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 Some better but no worse off 04/2011 - Rehab started mid aug 2018  BACK PAIN, LOW (ICD-724.2)  ANEMIA, IRON DEFICIENCY, UNSPEC. (ICD-280.9)  Health Maintenance.......................................................   [facility]   Family History:  crohn`s in daughter, M-lupus, htn, asthma,  no Ca, DM, CAD    Social History:   lives with twin children and grandson. single; >20 pack year history, quit in 2005; no EtOH  Laid off  from works for [employer] Sept 2013           Objective:   Physical Exam  01/16/2022       ***  07/28/2021    205  08/26/2020  219  11/17/2019      223   12/30/2018  188  wt 166 Oct 12, 2009 >170 October 28, 2010 > 169 06/29/2011  >>171 08/24/2011 > 01/15/2012  171 > 04/24/2012 176 > 06/28/2012  175> 08/13/2012 > 08/30/2012  173 > 170 11/28/2012 > 01/29/2013  176 > 174 >173 06/16/2013 > 171 09/18/2013 > 12/19/2013 177 >179 04/13/2014>  04/23/2014 177 > 08/21/2014 184 >182 11/27/2014 > 05/12/2015 177  > 05/11/2016   177 > 10/27/2016  175 > 11/09/2016 182 > 01/09/2017  198 > 02/20/2017  206 >  07/17/2017   209 >  09/03/2017   210 > 09/28/2017  206  > 11/26/2017   209 > 01/10/2018  210> 02/19/2018  206> 05/21/2018  198 > 08/06/2018  197 > 08/21/2018  196 > 11/22/2018  187    Vital signs reviewed  01/16/2022  - Note at rest 02 sats  ***% on ***   General appearance:    ***   Reports edentulous     Mild bar***      Labs ordered/ reviewed:      Chemistry      Component Value Date/Time   NA 142 07/28/2021 1019   NA 146 (H) 01/10/2019 1220   K 3.3 (L) 07/28/2021 1019   CL 98 07/28/2021 1019   CO2 37 (H) 07/28/2021 1019   BUN 14 07/28/2021 1019   BUN 21 01/10/2019 1220   CREATININE 0.95 07/28/2021 1019   CREATININE 0.64 02/04/2015 1619      Component Value Date/Time   CALCIUM 9.3 07/28/2021 1019   ALKPHOS 76 07/28/2021 1019   AST 15 07/28/2021 1019   ALT 10 07/28/2021 1019   BILITOT 0.4 07/28/2021 1019   BILITOT 0.4 02/05/2017 1530        Lab Results  Component Value Date   WBC 8.5 07/28/2021   HGB 9.9 (L) 07/28/2021   HCT 31.5 (L) 07/28/2021   MCV 77.8 (L) 07/28/2021   PLT 282.0 07/28/2021         Lab Results  Component Value Date   ESRSEDRATE 69 (H) 07/28/2021   ESRSEDRATE 48 (H) 11/17/2019   ESRSEDRATE 113 (H) 11/22/2018       Assessment:

## 2022-01-18 ENCOUNTER — Ambulatory Visit: Payer: Medicare Other | Admitting: Internal Medicine

## 2022-01-18 NOTE — Progress Notes (Unsigned)
Subjective:    Patient ID: [name], female   DOB: 03/16/1961    MRN: [id number]    Brief patient profile:  70  yobf quit smoking 10/2004 dx by transbronchial biopsy 5/92 with NCG  consistent with sarcoid. Since that time she's been off and on prednisone multiple  times with flares of coughing dyspnea and skin involvement > weaned off chronic prednisone Feb 2012 and placed back on 10/10/16 for deteriorating obstructive changes on pfts  With resp failure/ 02 dep        History of Present Illness  02/20/2017  f/u ov/Carrieann Spielberg re:   GOLD III/ pred 10 mg daily and bevespi 2bid  and alb rare  Chief Complaint  Patient presents with   Follow-up    6wk rov. pt states breathing is baseline. pt reports of sob with exertion & occ prod cough with green mucus mainly in the morning   Doe better :  [facility] = can't walk a nl pace on a flat grade s sob but does fine slow and flat eg shopping on  2lpm / ok at rest s 02  Uses 02 2lpm sleeping  rec Drop prednisone to 10 mg one half daily      NP eval 05/14/17  We will decrease your prednisone to 2.5 mg daily. We will prescribe 5 mg tablets. We will renew your prescription for Symbicort.  2 puffs twice daily. Rinse mouth after use. Follow up appointment with Dr. Melvyn Novas in 2 months. We will refer you to Pulmonary rehab > started Aug 2018  Referral to a dietician for weight loss >  at rehab     Date of Admission: 09/07/2017                    Date of Discharge: 09/09/17     Discharge Diagnoses/Problem List:  COPD GOLD stage IV HTN Anemia HLD Breast cancer s/p resection Sarcoidosis GERD       11/26/2017  f/u ov/Shadi Sessler re:  Sarcoid/skin involvement chronic cough ? Sinus dz/ using med calendar well / pednisone 10 mg daily  Chief Complaint  Patient presents with   Follow-up    Cough has improved some, but still producing some greenish yellow sputum first thing in the am.  She has not had to use her albuterol inhaler. She c/o shakiness in her  hands for the past couple of months.    less cough p augmentin but still am sputum green / no noct symptoms Doing well with rehab but sometimes has to turn 02 up to 4 lpm there but doesn't do so in other settings and concerned about wt gain  rec Start plaquenil 200 mg daily  Ok to adjust the prednisone 39m  from 1-2 pills in am as per med calendar See calendar for specific medication instructions    01/10/2018  f/u ov/Safwan Tomei re:  Sarcoid/ plaquenil 200 mg daily plus pred 5 mg daily / last time off was one year prior/ 02 = 2lpm floor and 4lpm with ex Chief Complaint  Patient presents with   Follow-up    repeat LFT's today. She states she has noticed some shaking in her hands for a while now and forgot to mention this at last ov. Her breathing is doing well and she is down to 5 mg pred.   Dyspnea:  Ex bike x 15-30 min / low resistance and 02 up to 3-4lpm sats in mid 90s Cough: no Sleep: on 2lpm / one pillow  SABA  use:  Not needing  rec Try prednisone 5 mg  Reduce to one half daily to see if not doing as well and if not increase prednisone to 5 mg and call me for higher dose of plaquenil  Late add: with esr high and lfts ok rec bid plaquenil and return in one month for pfts/lfts> could not tol 400 mg daily due to n and v but ok on 200 mg daily      05/21/2018  f/u ov/Juriel Cid re:  codp GOLD IV/ 02 dep on pred 5 mg x 2  daily for sarcoid / intol of plaquenil > 200 mg (N/V) Using med cal well  Chief Complaint  Patient presents with   Follow-up    Breathing unchanged since the last visit. She has prod cough in the am's with min green sputum. She is using her albuterol inhaler 1 x per wk on average.   Dyspnea:  On 3-4 lpm does [employer] / also able to do ex bike x 15 min  Cough: just in am , slt discolored mucus  SABA use: rare, usually if goes out in hear  02: 2lpm hs and 3-4 lpm daytime   rec Please see patient coordinator before you leave today  to schedule referral to dermatology  No change  in medications except use prednisone 5 mg  X 2 when breathing/ coughing worse then 1 daily thereafter Follow the medication calendar we reviewed today    11/22/2018  f/u ov/Engelbert Sevin re:  Sarcooid/ copd/ 02 dep resp failure/ indolent onset x 3  weeks no appetite fatigue  Increased sob on pred 5 mg daily / no med calendar  Chief Complaint  Patient presents with   Follow-up    Pt c/o increased SOB and cough the past few days. Her cough is prod at times with yellow to clear sputum. She states her mouth is very dry in the mornings. Her appetite is poor and she is down 9 lbs since the last visit. She is using her albuterol inhaler 2 x per wk on average.    Dyspnea:  mb and back flat ok on 3lpm Pulsed when walks  Cough: none Sleeping: on side bed is horizontal SABA use: couple of times a week 02: 2lpm and up to 3lpm walk rec Please remember to go to the lab department   for your tests - we will call you with the results when they are available. Take 20 prednisone daily until better then 10 mg per day and w/in 3 days got worse more /sob/ decreased sats so admit 12/17/18   Admit date: 12/17/2018 Discharge date: 12/22/2018     DISCHARGE DIAGNOSES:  Multifocal pneumonia likely community-acquired Acute COPD exacerbation, improved Acute on chronic respiratory failure with hypoxia History of sarcoidosis on chronic steroids Acute kidney injury, resolved Essential hypertension History of breast cancer   12/30/2018  f/u ov/Hady Niemczyk re: post hosp eval / sarcoid on plaquenil and floor of pred 5 mg daily/ 02 2lpm - 3lpm with activity / copd on stiolto Chief Complaint  Patient presents with   Hospitalization Follow-up    Breathing has improved almost back to her normal baseline. She is coughing with minimal clear sputum. She has been using her albuterol inhaler about once per wk and she has used neb x 3 since hospital d/c.    Dyspnea:  MMRC3 = can't walk 100 yards even at a slow pace at a flat grade s stopping due  to sob   Cough: minimal clear  Sleeping: bed is flat/ props up on 2 pillows SABA use: as abov3 02:  2lpm 24/7  rec Prednisone 10 mg 2 daily until better, then 1daily  Please remember to go to the  x-ray department  for your tests - we will call you with the results when they are available    11/17/2019  f/u ov/Enedelia Martorelli re: sarcoid on plaquenil 200 as could not tol 400/day and pred 5 mg daily  Chief Complaint  Patient presents with   Follow-up    COPD, Sarcoidosis, SOB, cough   Dyspnea:  Not limited by breathing from desired activities / uses [facility] parking though and very slow pace on 02  = MMRC2 = can't walk a nl pace on a flat grade s sob but does fine slow and flat   Cough: some in ams  Sleeping: on back, bed is flat, 2 pillows  SABA use: none  02: 2lpm hs, sitting,  When walking 3lpm  rec We will certify you for 02 today  Add pepcid 20 mg at bedtime to see if helps the am cough  If cough or breathing worse, go to 10 mg per day dose until better then resume previous dose  Late add:  No need for 02 at rest if sats >90%    08/26/2020  f/u ov/Damareon Lanni re: sarcoid on plaquenil 200 mg pred 5/2.5  And gold iv copd/ 02 dep on stiolto  Chief Complaint  Patient presents with   Follow-up    Breathing is overall doing well. She has occ cough in the am's- prod with clear sputum. She is using her albuterol inhaler about once per wk and has not used neb recently.   Dyspnea:  No outside walking  Cough: minimal throrat clearing only  Sleeping: bed is flat/ 2 pillows  SABA use: rarely any at all and all hfa/ no neb  02: 2lpm hs / up to 3lpm walking but not checking sats at all  Rec Make sure you check your oxygen saturations at highest level of ex and adjust for sats > 90% goal    07/28/2021  f/u ov/Arella Blinder re: sarcoid / gold 4 copd/ 02 dep  maint on stiolto x 2/ pred 5-2.5 -5   Chief Complaint  Patient presents with   Follow-up    Breathing is overall doing well. She uses her albuterol inhaler about  1-2 x per week. She rarely uses neb. She has been having occ cough, esp in the am with thick, green sputum.   Dyspnea:  grocery shopping [employer] with sats  typically above 90% on 2 POC  Cough: green x one week, getting better with increase pred to 5 mg from 5-2.5-5  Sleeping: bed is flat, 2 pillows  SABA use: as above 02: 2lpm Covid status:   vax x 2, never infected  Mouth dry "feels like a rash"  Rec Zpak should clear the green mucus  When breathing / cough bad > prednisone  5 mg x 2 until better then work back to 2.5 mg daily  Make sure you check your oxygen saturation  at your highest level of activity  to be sure it stays over 90%  Get the new booster for covid 19  Please remember to go to the lab     01/18/2022  f/u ov/Kollin Udell re: ***   maint on ***  No chief complaint on file.   Dyspnea:  *** Cough: *** Sleeping: *** SABA use: *** 02: *** Covid status:   ***  No obvious day to day or daytime variability or assoc excess/ purulent sputum or mucus plugs or hemoptysis or cp or chest tightness, subjective wheeze or overt sinus or hb symptoms.   *** without nocturnal  or early am exacerbation  of respiratory  c/o's or need for noct saba. Also denies any obvious fluctuation of symptoms with weather or environmental changes or other aggravating or alleviating factors except as outlined above   No unusual exposure hx or h/o childhood pna/ asthma or knowledge of premature birth.  Current Allergies, Complete Past Medical History, Past Surgical History, Family History, and Social History were reviewed in [employer] record.  ROS  The following are not active complaints unless bolded Hoarseness, sore throat, dysphagia, dental problems, itching, sneezing,  nasal congestion or discharge of excess mucus or purulent secretions, ear ache,   fever, chills, sweats, unintended wt loss or wt gain, classically pleuritic or exertional cp,  orthopnea pnd or arm/hand swelling  or  leg swelling, presyncope, palpitations, abdominal pain, anorexia, nausea, vomiting, diarrhea  or change in bowel habits or change in bladder habits, change in stools or change in urine, dysuria, hematuria,  rash, arthralgias, visual complaints, headache, numbness, weakness or ataxia or problems with walking or coordination,  change in mood or  memory.        No outpatient medications have been marked as taking for the 01/18/22 encounter (Appointment) with Tanda Rockers, MD.                         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 Unice Cobble  -h/o Daily prednisone since 2007   > off completely Feb 2012 with no problem> restarted  09/2016  ? 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 Some better but no worse off 04/2011 - Rehab started mid aug 2018  BACK PAIN, LOW (ICD-724.2)  ANEMIA, IRON DEFICIENCY, UNSPEC. (ICD-280.9)  Health Maintenance.......................................................   [facility]   Family History:  crohn`s in daughter, M-lupus, htn, asthma,  no Ca, DM, CAD    Social History:   lives with twin children and grandson. single; >20 pack year history, quit in 2005; no EtOH  Laid off  from works for [employer] Sept 2013           Objective:   Physical Exam  01/18/2022       ***  07/28/2021    205  08/26/2020  219  11/17/2019      223   12/30/2018  188  wt 166 Oct 12, 2009 >170 October 28, 2010 > 169 06/29/2011  >>171 08/24/2011 > 01/15/2012  171 > 04/24/2012 176 > 06/28/2012  175> 08/13/2012 > 08/30/2012  173 > 170 11/28/2012 > 01/29/2013  176 > 174 >173 06/16/2013 > 171 09/18/2013 > 12/19/2013 177 >179 04/13/2014>  04/23/2014 177 > 08/21/2014 184 >182 11/27/2014 > 05/12/2015 177  > 05/11/2016   177 > 10/27/2016  175 > 11/09/2016 182 > 01/09/2017  198 > 02/20/2017  206 >  07/17/2017   209 >  09/03/2017   210 > 09/28/2017  206  > 11/26/2017   209 > 01/10/2018  210> 02/19/2018  206> 05/21/2018  198 > 08/06/2018  197 > 08/21/2018  196 > 11/22/2018  187    Vital signs reviewed  01/18/2022  - Note at rest 02 sats  ***% on ***   General appearance:    ***   Reports edentulous     Mild bar***      Labs ordered/ reviewed:      Chemistry      Component Value Date/Time   NA 142 07/28/2021 1019   NA 146 (H) 01/10/2019 1220   K 3.3 (L) 07/28/2021 1019   CL 98 07/28/2021 1019   CO2 37 (H) 07/28/2021 1019   BUN 14 07/28/2021 1019   BUN 21 01/10/2019 1220   CREATININE 0.95 07/28/2021 1019   CREATININE 0.64 02/04/2015 1619      Component Value Date/Time   CALCIUM 9.3 07/28/2021 1019   ALKPHOS 76 07/28/2021 1019   AST 15 07/28/2021 1019   ALT 10 07/28/2021 1019   BILITOT 0.4 07/28/2021 1019   BILITOT 0.4 02/05/2017 1530        Lab Results  Component Value Date   WBC 8.5 07/28/2021   HGB 9.9 (L) 07/28/2021   HCT 31.5 (L) 07/28/2021   MCV 77.8 (L) 07/28/2021   PLT 282.0 07/28/2021         Lab Results  Component Value Date   ESRSEDRATE 69 (H) 07/28/2021   ESRSEDRATE 48 (H) 11/17/2019   ESRSEDRATE 113 (H) 11/22/2018       Assessment:

## 2022-01-21 ENCOUNTER — Emergency Department (HOSPITAL_COMMUNITY)
Admission: EM | Admit: 2022-01-21 | Discharge: 2022-01-21 | Disposition: A | Discharge status: Home / Self Care | Payer: Medicare Other | Attending: Emergency Medicine | Admitting: Emergency Medicine

## 2022-01-21 ENCOUNTER — Other Ambulatory Visit: Payer: Self-pay

## 2022-01-21 ENCOUNTER — Emergency Department (HOSPITAL_COMMUNITY): Payer: Medicare Other

## 2022-01-21 ENCOUNTER — Encounter (HOSPITAL_COMMUNITY): Payer: Self-pay

## 2022-01-21 DIAGNOSIS — I10 Essential (primary) hypertension: Secondary | ICD-10-CM | POA: Insufficient documentation

## 2022-01-21 DIAGNOSIS — Z79899 Other long term (current) drug therapy: Secondary | ICD-10-CM | POA: Insufficient documentation

## 2022-01-21 DIAGNOSIS — R0602 Shortness of breath: Secondary | ICD-10-CM | POA: Insufficient documentation

## 2022-01-21 DIAGNOSIS — J449 Chronic obstructive pulmonary disease, unspecified: Secondary | ICD-10-CM | POA: Insufficient documentation

## 2022-01-21 DIAGNOSIS — Z853 Personal history of malignant neoplasm of breast: Secondary | ICD-10-CM | POA: Insufficient documentation

## 2022-01-21 DIAGNOSIS — R002 Palpitations: Secondary | ICD-10-CM | POA: Insufficient documentation

## 2022-01-21 DIAGNOSIS — Z7982 Long term (current) use of aspirin: Secondary | ICD-10-CM | POA: Insufficient documentation

## 2022-01-21 LAB — COMPREHENSIVE METABOLIC PANEL
ALT: 11 U/L (ref 0–44)
AST: 17 U/L (ref 15–41)
Albumin: 3.4 g/dL — ABNORMAL LOW (ref 3.5–5.0)
Alkaline Phosphatase: 65 U/L (ref 38–126)
Anion gap: 11 (ref 5–15)
BUN: 12 mg/dL (ref 8–23)
CO2: 31 mmol/L (ref 22–32)
Calcium: 9.3 mg/dL (ref 8.9–10.3)
Chloride: 98 mmol/L (ref 98–111)
Creatinine, Ser: 1.04 mg/dL — ABNORMAL HIGH (ref 0.44–1.00)
GFR, Estimated: >60 mL/min (ref >60)
Glucose, Bld: 104 mg/dL — ABNORMAL HIGH (ref 70–99)
Potassium: 3.7 mmol/L (ref 3.5–5.1)
Sodium: 140 mmol/L (ref 135–145)
Total Bilirubin: 0.5 mg/dL (ref 0.3–1.2)
Total Protein: 6.7 g/dL (ref 6.5–8.1)

## 2022-01-21 LAB — CBC WITH DIFFERENTIAL/PLATELET
Abs Immature Granulocytes: 0.02 10*3/uL (ref 0.00–0.07)
Basophils Absolute: 0 10*3/uL (ref 0.0–0.1)
Basophils Relative: 0 %
Eosinophils Absolute: 0.2 10*3/uL (ref 0.0–0.5)
Eosinophils Relative: 3 %
HCT: 32.7 % — ABNORMAL LOW (ref 36.0–46.0)
Hemoglobin: 9.8 g/dL — ABNORMAL LOW (ref 12.0–15.0)
Immature Granulocytes: 0 %
Lymphocytes Relative: 20 %
Lymphs Abs: 1.4 10*3/uL (ref 0.7–4.0)
MCH: 25.1 pg — ABNORMAL LOW (ref 26.0–34.0)
MCHC: 30 g/dL (ref 30.0–36.0)
MCV: 83.6 fL (ref 80.0–100.0)
Monocytes Absolute: 0.8 10*3/uL (ref 0.1–1.0)
Monocytes Relative: 10 %
Neutro Abs: 4.9 10*3/uL (ref 1.7–7.7)
Neutrophils Relative %: 67 %
Platelets: 257 10*3/uL (ref 150–400)
RBC: 3.91 MIL/uL (ref 3.87–5.11)
RDW: 15 % (ref 11.5–15.5)
WBC: 7.3 10*3/uL (ref 4.0–10.5)
nRBC: 0 % (ref 0.0–0.2)

## 2022-01-21 LAB — BRAIN NATRIURETIC PEPTIDE: B Natriuretic Peptide: 73.3 pg/mL (ref 0.0–100.0)

## 2022-01-21 LAB — TSH: TSH: 1.012 u[IU]/mL (ref 0.350–4.500)

## 2022-01-21 LAB — T4, FREE: Free T4: 0.88 ng/dL (ref 0.61–1.12)

## 2022-01-21 LAB — TROPONIN I (HIGH SENSITIVITY)
Troponin I (High Sensitivity): 5 ng/L (ref <18)
Troponin I (High Sensitivity): 5 ng/L (ref <18)

## 2022-01-21 LAB — MAGNESIUM: Magnesium: 1.8 mg/dL (ref 1.7–2.4)

## 2022-01-21 MED ORDER — IOHEXOL 350 MG/ML SOLN
66.0000 mL | Freq: Once | INTRAVENOUS | Status: AC | PRN
  Administered 2022-01-21: 66 mL via INTRAVENOUS

## 2022-01-21 NOTE — ED Provider Notes (Signed)
[facility] EMERGENCY DEPARTMENT Provider Note  CSN: [id number] Arrival date & time: 01/21/22 0013  Chief Complaint(s) Palpitations  HPI [name] is a 61 y.o. female with a past medical history listed below including sarcoidosis, hypertension, hyperlipidemia who presents to the emergency department with 2 days of a "funny feeling" in her left chest.  Patient describes palpitations.  She denies any overt chest pain.  She does endorse shortness of breath over the same timeframe which is worse with exertion.  She denies any recent fevers or infections.  No coughing or congestion.  No nausea or vomiting.  She reports mild abdominal discomfort earlier this morning in the epigastrium which subsided during the day.   She was brought in by EMS who noted patient was in bigeminy.   The history is provided by the patient.   Past Medical History Past Medical History:  Diagnosis Date   Acute on chronic respiratory failure ([place]) 10/09/2016   Allergic rhinitis    Breast cancer ([place]) 2016   right breast   COPD, mild (HCC) FOLLOWED BY DR ENID   GERD (gastroesophageal reflux disease)    HTN (hypertension)    Hypertension    Iron deficiency anemia    Long-term current use of steroids SYMBICORT INHALER   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    Overweight (BMI 25.0-29.9) 05/11/2016   Personal history of radiation therapy 2016   Sarcoidosis STABLE PER CXR JUNE 2013   Shortness of breath    Sickle cell trait ([place])    Patient Active Problem List   Diagnosis Date Noted   Acute right-sided low back pain without sciatica 03/28/2019   Benign neoplasm of ascending colon    Anemia 01/10/2019   Chronic cough 11/26/2017   Obesity (BMI 30-39.9) 01/10/2017   Dyspnea on exertion 11/09/2016   Chronic respiratory failure with hypoxia and hypercapnia ([place]) 10/29/2016   Depression with anxiety 08/07/2016   Hot flashes 02/01/2016   Breast cancer of upper-outer quadrant of right female breast ([place])  08/04/2015   GERD (gastroesophageal reflux disease) 12/21/2012   Hyperlipidemia 12/21/2012   SICKLE-CELL TRAIT 04/15/2008   Sarcoidosis ([place]) 01/10/2007   HYPERTENSION, BENIGN SYSTEMIC 01/10/2007   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 Authorizing Provider  albuterol (VENTOLIN HFA) 108 (90 Base) MCG/ACT inhaler INHALE 2 PUFFS EVERY 6 HOURS AS NEEDED FOR WHEEZING OR SHORTNESS OF BREATH 09/23/19  Yes Tanda Rockers, MD  anastrozole (ARIMIDEX) 1 MG tablet Take 1 tablet (1 mg total) by mouth daily. 06/14/21  Yes Nicholas Lose, MD  aspirin EC 81 MG tablet Take 1 tablet (81 mg total) by mouth daily. Start 02/23/19 02/21/19  Yes Pokhrel, Laxman, MD  atorvastatin (LIPITOR) 20 MG tablet TAKE 1 TABLET (20 MG TOTAL) BY MOUTH DAILY. 03/28/21  Yes Benay Pike, MD  ferrous sulfate 325 (65 FE) MG tablet Take 1 tablet (325 mg total) by mouth daily with breakfast. Patient taking differently: Take 325 mg by mouth every other day. 02/21/19  Yes Pokhrel, Laxman, MD  gabapentin (NEURONTIN) 300 MG capsule TAKE 1 CAPSULE BY MOUTH IN  THE MORNING AND 2 CAPSULES  IN THE EVENING 11/30/21  Yes Nicholas Lose, MD  hydroxychloroquine (PLAQUENIL) 200 MG tablet Take 1 tablet (200 mg total) by mouth daily. 08/02/21  Yes Tanda Rockers, MD  metoprolol tartrate (LOPRESSOR) 25 MG tablet  Take 1 tablet (25 mg total) by mouth 2 (two) times daily. 03/09/21  Yes Benay Pike, MD  Multiple Vitamin (MULTIVITAMIN) capsule Take 1 capsule by mouth daily.   Yes [provider]  omeprazole (PRILOSEC) 20 MG capsule TAKE 1 CAPSULE EVERY DAY Patient taking differently: Take 20 mg by mouth daily. 01/03/21  Yes Benay Pike, MD  predniSONE (DELTASONE) 5 MG tablet 5 mg take 2 until better then gradually taper to one half daily 10/19/21  Yes Tanda Rockers, MD  sertraline  (ZOLOFT) 50 MG tablet TAKE 1 TABLET EVERY DAY Patient taking differently: Take 50 mg by mouth daily. 09/11/20  Yes Benay Pike, MD  Tiotropium Bromide-Olodaterol (STIOLTO RESPIMAT) 2.5-2.5 MCG/ACT AERS Inhale 2 puffs into the lungs daily. 11/16/21  Yes Tanda Rockers, MD  triamterene-hydrochlorothiazide (FWYOVZC-58) 37.5-25 MG tablet TAKE 1 TABLET EVERY DAY Patient taking differently: Take 1 tablet by mouth daily. TAKE 1 TABLET EVERY DAY 06/02/21  Yes Tanda Rockers, MD  albuterol (PROVENTIL) (2.5 MG/3ML) 0.083% nebulizer solution Take 3 mLs (2.5 mg total) by nebulization every 6 (six) hours as needed for wheezing or shortness of breath. Patient not taking: Reported on 01/21/2022 12/22/18   Bonnielee Haff, MD  amoxicillin-clavulanate (AUGMENTIN) 875-125 MG tablet Take 1 tablet by mouth 2 (two) times daily. Patient not taking: Reported on 01/21/2022 08/05/21   Tanda Rockers, MD  azithromycin [facility]) 250 MG tablet 2 on day one then one daily until gone Patient not taking: Reported on 01/21/2022 07/28/21   Tanda Rockers, MD  fluticasone [facility]) 50 MCG/ACT nasal spray USE 2 SPRAYS IN BOTH  NOSTRILS DAILY AS NEEDED  FOR ALLERGIES Patient not taking: Reported on 01/21/2022 08/15/21   Tanda Rockers, MD  Guaifenesin 1200 MG TB12 Take 1,200 mg by mouth 2 (two) times daily as needed (chest congestion).  Patient not taking: Reported on 01/21/2022    [provider]  meloxicam (MOBIC) 7.5 MG tablet Take 1 tablet (7.5 mg total) by mouth daily. Patient not taking: Reported on 01/21/2022 07/18/19   Benay Pike, MD  methocarbamol (ROBAXIN) 500 MG tablet Take 1 tablet (500 mg total) by mouth 2 (two) times daily. Patient not taking: Reported on 01/21/2022 02/18/20   Tacy Learn, PA-C  OXYGEN 2lpm with rest and 3lpm with exertion    [provider]                                                                                                                                     Allergies Codeine  Review of Systems Review of Systems As noted in HPI  Physical Exam Vital Signs  I have reviewed the triage vital signs BP 109/68    Pulse 72    Temp 98.2 F (36.8 C) (Oral)    Resp 12    SpO2 100%   Physical Exam Vitals reviewed.  Constitutional:  General: She is not in acute distress.    Appearance: She is well-developed. She is not diaphoretic.  HENT:     Head: Normocephalic and atraumatic.     Nose: Nose normal.  Eyes:     General: No scleral icterus.       Right eye: No discharge.        Left eye: No discharge.     Conjunctiva/sclera: Conjunctivae normal.     Pupils: Pupils are equal, round, and reactive to light.  Cardiovascular:     Rate and Rhythm: Normal rate. Rhythm regularly irregular.     Heart sounds: No murmur heard.   No friction rub. No gallop.  Pulmonary:     Effort: Pulmonary effort is normal. No respiratory distress.     Breath sounds: Normal breath sounds. No stridor. No rales.  Abdominal:     General: There is no distension.     Palpations: Abdomen is soft.     Tenderness: There is no abdominal tenderness.  Musculoskeletal:        General: No tenderness.     Cervical back: Normal range of motion and neck supple.     Right lower leg: No edema.     Left lower leg: No edema.  Skin:    General: Skin is warm and dry.     Findings: No erythema or rash.  Neurological:     Mental Status: She is alert and oriented to person, place, and time.    ED Results and Treatments Labs (all labs ordered are listed, but only abnormal results are displayed) Labs Reviewed  CBC WITH DIFFERENTIAL/PLATELET - Abnormal; Notable for the following components:      Result Value   Hemoglobin 9.8 (*)    HCT 32.7 (*)    MCH 25.1 (*)    All other components within normal limits  COMPREHENSIVE METABOLIC PANEL - Abnormal; Notable for the following components:   Glucose, Bld 104 (*)    Creatinine, Ser 1.04 (*)    Albumin 3.4 (*)    All other  components within normal limits  BRAIN NATRIURETIC PEPTIDE  MAGNESIUM  TSH  T4, FREE  TROPONIN I (HIGH SENSITIVITY)  TROPONIN I (HIGH SENSITIVITY)                                                                                                                         EKG  EKG Interpretation  Date/Time:  Saturday January 21 2022 00:20:53 EST Ventricular Rate:  80 PR Interval:  174 QRS Duration: 100 QT Interval:  388 QTC Calculation: 448 R Axis:   51 Text Interpretation: Sinus rhythm Multiform ventricular premature complexes Abnormal R-wave progression, early transition Confirmed by Addison Lank [phone]) on 01/21/2022 1:29:34 AM       Radiology CT Angio Chest PE W and/or Wo Contrast  Result Date: 01/21/2022 CLINICAL DATA:  Pulmonary embolus suspected with high probability. Palpitations and left chest feels funny. EXAM: CT ANGIOGRAPHY CHEST WITH CONTRAST TECHNIQUE: Multidetector CT  imaging of the chest was performed using the standard protocol during bolus administration of intravenous contrast. Multiplanar CT image reconstructions and MIPs were obtained to evaluate the vascular anatomy. RADIATION DOSE REDUCTION: This exam was performed according to the departmental dose-optimization program which includes automated exposure control, adjustment of the mA and/or kV according to patient size and/or use of iterative reconstruction technique. CONTRAST:  30m OMNIPAQUE IOHEXOL 350 MG/ML SOLN COMPARISON:  10/06/2016 FINDINGS: Cardiovascular: Good opacification of the central and segmental pulmonary arteries. No focal filling defects. No evidence of significant pulmonary embolus. Normal heart size. Normal caliber thoracic aorta. No aortic dissection. Calcification of the aorta and coronary arteries. Great vessel origins are patent although there is evidence of moderate stenosis of the origin of the left common carotid artery. Mediastinum/Nodes: Thyroid gland is unremarkable. Esophagus is decompressed.  No significant lymphadenopathy. Lungs/Pleura: Prominent emphysematous changes in the lungs with scattered fibrosis. Peribronchial thickening and bronchiectasis consistent with chronic bronchitis. Spiculated mass in the right upper lung measuring 1.8 x 2.2 cm. This is new since the previous study. Scarring in the lung bases. No pleural effusions. Upper Abdomen: Cirrhotic changes suggested in the liver with nodular contour and enlargement of the lateral segment left lobe. No acute abnormalities demonstrated in the visualized upper abdomen. Musculoskeletal: Degenerative changes in the spine. No destructive bone lesions. Review of the MIP images confirms the above findings. IMPRESSION: 1. No evidence of significant pulmonary embolus. 2. 20 mm suspicious solid pulmonary nodule within the right upper lobe. Consider a non-contrast Chest CT at 3 months, a PET/CT, or tissue sampling. 3. Diffuse emphysema and scattered fibrosis in the lungs. 4. Probable hepatic cirrhosis. These guidelines do not apply to immunocompromised patients and patients with cancer. Follow up in patients with significant comorbidities as clinically warranted. For lung cancer screening, adhere to Lung-RADS guidelines. Reference: Radiology. 2017; 284(1):228-43. Electronically Signed   By: WLucienne CapersM.D.   On: 01/21/2022 03:40   DG Chest Port 1 View  Result Date: 01/21/2022 CLINICAL DATA:  Palpitation.  History of sarcoidosis. EXAM: PORTABLE CHEST 1 VIEW COMPARISON:  Chest radiograph dated 07/28/2021. FINDINGS: Background of emphysema and scarring similar to prior radiograph and likely sequela of known sarcoidosis. No new consolidation. There is no pleural effusion pneumothorax. The cardiac silhouette is within normal limits. No acute osseous pathology. IMPRESSION: No acute cardiopulmonary process. Stable appearance of the chest. Electronically Signed   By: AAnner CreteM.D.   On: 01/21/2022 01:14    Pertinent labs & imaging results that  were available during my care of the patient were reviewed by me and considered in my medical decision making (see MDM for details).  Medications Ordered in ED Medications  iohexol (OMNIPAQUE) 350 MG/ML injection 66 mL (66 mLs Intravenous Contrast Given 01/21/22 0328)  Procedures Procedures  (including critical care time)  Medical Decision Making / ED Course    Complexity of Problem:  Co-morbidities/SDOH that complicate the patient evaluation/care: Noted in HPI  Additional history obtained: none  Patient's presenting problem/concern and DDX listed below: Palpitations We will assess for any primary cardiac dysrhythmias, electrolyte derangements, renal insufficiency, anemia, HF. Lower concern for ACS or PE, but will rule out. Will also check thyroid panel.     Complexity of Data:   Cardiac Monitoring: The patient was maintained on a cardiac monitor.   I personally viewed and interpreted the cardiac monitored which showed an underlying rhythm of normal sinus rhythm with frequent PACs.  Resolved with position adjustment.  No other dysrhythmias or blocks. EKG without acute ischemic changes or evidence of pericarditis.  Notable for intermittent PVCs  Laboratory Tests ordered listed below with my independent interpretation: CBC without leukocytosis.  Stable hemoglobin Metabolic panel without significant electrolyte derangements or renal sufficiency Trop negative x2 BnP normal TSH within normal limits   Imaging Studies ordered listed below with my independent interpretation: On my read of the chest x-ray, there was no evidence suggestive of pneumonia, pneumothorax, pneumomediastinum, pulmonary edema concerning for new or exacerbation of heart failure, abnormal contour of the mediastinum to suggest dissection, and no evidence of acute injuries. CTA  negative for PE, pneumonia or pneumothorax Confirmed by radiology who also noted a 2 cm pulmonary nodule.  Patient made aware.      ED Course:    Hospitalization Considered:  Yes, if found to be in HF  Assessment, Intervention, and Reassessment: Palpitations  Ruled out for the above. Remained on telemetry without recurrence of her frequent PACs/PVCs. She ambulated on her baseline oxygen and maintain sats above 93%. Recommended PCP/cardiology follow-up.  Final Clinical Impression(s) / ED Diagnoses Final diagnoses:  Palpitations   The patient appears reasonably screened and/or stabilized for discharge and I doubt any other medical condition or other [facility] requiring further screening, evaluation, or treatment in the ED at this time prior to discharge. Safe for discharge with strict return precautions.  Disposition: Discharge  Condition: Good  I have discussed the results, Dx and Tx plan with the patient/family who expressed understanding and agree(s) with the plan. Discharge instructions discussed at length. The patient/family was given strict return precautions who verbalized understanding of the instructions. No further questions at time of discharge.    ED Discharge Orders     None         Follow Up: [address] [place] [address] [phone]  Call  to schedule an appointment for close follow up  [place] [address] [phone] Call  to schedule an appointment for close follow up           This chart was dictated using voice recognition software.  Despite best efforts to proofread,  errors can occur which can change the documentation meaning.    Fatima Blank, MD 01/21/22 [phone]

## 2022-01-21 NOTE — Discharge Instructions (Addendum)
During the workup we noted incidental findings on your imaging that would require you to follow-up with your regular doctor for further evaluation/management: ?2. 20 mm suspicious solid pulmonary nodule within the right upper  ?lobe. Consider a non-contrast Chest CT at 3 months, a PET/CT, or  ?tissue sampling  ? ?

## 2022-01-21 NOTE — ED Notes (Signed)
Ambulated pt with spo2, lowest reading 93. Pt had steady gait, but complained of slight trouble catching breath ?

## 2022-01-21 NOTE — ED Triage Notes (Signed)
Pt BIB EMS for palpitations and "left chest feeling funny" . Pt complains of feeling palpitations and her heart feeling like it was going to beat out of her chest. Pt in bigeminy on EMS arrival and converted to NSR for about 2-3 minutes en route before going back to bigeminy  ? ?Did not take ASA with EMS or at home  ? ?147/88 ?99 2L baseline ?84 bigeminy  ?72 when Nsr  ?18 RR  ? ?

## 2022-01-21 NOTE — ED Notes (Signed)
Patient transported to CT 

## 2022-01-25 ENCOUNTER — Ambulatory Visit: Payer: Medicare Other | Admitting: Internal Medicine

## 2022-01-27 ENCOUNTER — Ambulatory Visit: Payer: Medicare Other | Admitting: Interventional Cardiology

## 2022-02-02 ENCOUNTER — Ambulatory Visit: Payer: Medicare Other | Admitting: Cardiology

## 2022-02-07 ENCOUNTER — Ambulatory Visit: Payer: Medicare Other | Admitting: Internal Medicine

## 2022-02-08 ENCOUNTER — Ambulatory Visit: Payer: Medicare Other | Admitting: Internal Medicine

## 2022-02-08 NOTE — Progress Notes (Deleted)
?Cardiology Office Note:   ? ?Date:  02/08/2022  ? ?ID:  [name], DOB [birth year], MRN [id number] ? ?PCP:  [name]., FNP ?  ?[facility] Providers ?Cardiologist:  None { ?Click to update primary MD,subspecialty MD or APP then REFRESH:1}   ? ?Referring MD: [name],*  ? ?No chief complaint on file. ?Palpitaitons ? ?History of Present Illness:   ? ?[name] is a 61 y.o. female with a hx of *** ? ?Past Medical History:  ?Diagnosis Date  ? Acute on chronic respiratory failure ([name]) 10/09/2016  ? Allergic rhinitis   ? Breast cancer ([name]) 2016  ? right breast  ? COPD, mild ([name]) FOLLOWED BY DR Melvyn Novas  ? GERD (gastroesophageal reflux disease)   ? HTN (hypertension)   ? Hypertension   ? Iron deficiency anemia   ? Long-term current use of steroids SYMBICORT INHALER  ? 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   ? Overweight (BMI 25.0-29.9) 05/11/2016  ? Palpitations   ? Personal history of radiation therapy 2016  ? Sarcoidosis STABLE PER CXR JUNE 2013  ? Shortness of breath   ? Sickle cell trait ([name])   ? ? ?Past Surgical History:  ?Procedure Laterality Date  ? ADJUSTABLE SUTURE MANIPULATION  05/22/2012  ? Procedure: ADJUSTABLE SUTURE MANIPULATION;  Surgeon: Dara Hoyer, MD;  Location: [facility];  Service: Ophthalmology;  Laterality: Right;  ? BREAST LUMPECTOMY Right 2016  ? [place]  ? W/ BILATERAL TUBAL LIGATION  ? COLONOSCOPY WITH PROPOFOL N/A 02/21/2019  ? Procedure: COLONOSCOPY WITH PROPOFOL;  Surgeon: Irene Shipper, MD;  Location: [facility];  Service: Endoscopy;  Laterality: N/A;  ? EYE SURGERY    ? MEDIAN RECTUS REPAIR  05/22/2012  ? Procedure: MEDIAN RECTUS REPAIR;  Surgeon: Dara Hoyer, MD;  Location: [facility];  Service: Ophthalmology;  Laterality: Bilateral;  INFERIOR RECTUS RESECTION WITH ADJUSTIBLE SUTURES RIGHT EYE ?  ? POLYPECTOMY  02/21/2019  ? Procedure: POLYPECTOMY;  Surgeon: Irene Shipper, MD;  Location: [facility];  Service: Endoscopy;;   ? RADIOACTIVE SEED GUIDED PARTIAL MASTECTOMY WITH AXILLARY SENTINEL LYMPH NODE BIOPSY Right 08/18/2015  ? Procedure: RADIOACTIVE SEED GUIDED PARTIAL MASTECTOMY WITH AXILLARY SENTINEL LYMPH NODE BIOPSY;  Surgeon: Stark Klein, MD;  Location: [place];  Service: General;  Laterality: Right;  ? TOTAL ROBOTIC ASSISTED LAPAROSCOPIC HYSTERECTOMY  12-30-2010  ? SYMPTOMATIC UTERINE FIBROIDS  ? UPPER TEETH EXTRACTION'S  1992  ? ? ?Current Medications: ?No outpatient medications have been marked as taking for the 02/08/22 encounter (Appointment) with [name], MD.  ?  ? ?Allergies:   Codeine  ? ?Social History  ? ?Socioeconomic History  ? Marital status: Single  ?  Spouse name: Not on file  ? Number of children: Not on file  ? Years of education: Not on file  ? Highest education level: Not on file  ?Occupational History  ? Not on file  ?Tobacco Use  ? Smoking status: Former  ?  Packs/day: 1.00  ?  Years: 20.00  ?  Pack years: 20.00  ?  Types: Cigarettes  ?  Quit date: 11/14/2003  ?  Years since quitting: 18.2  ? Smokeless tobacco: Never  ?Vaping Use  ? Vaping Use: Never used  ?Substance and Sexual Activity  ? Alcohol use: No  ? Drug use: No  ? Sexual activity: Not on file  ?Other Topics Concern  ? Not on file  ?Social History Narrative  ?  Lives with mother and his 3 children.  Works as a Clinical research associate for [employer], product/process development)  ?   ?   ? ?Social Determinants of Health  ? ?Financial Resource Strain: Not on file  ?Food Insecurity: Not on file  ?Transportation Needs: Not on file  ?Physical Activity: Not on file  ?Stress: Not on file  ?Social Connections: Not on file  ?  ? ?Family History: ?The patient's ***family history includes Asthma in her mother; Breast cancer (age of onset: 23) in her mother; Hyperlipidemia in her father and mother; Hypertension in her brother, father, and sister; Lupus in her mother. There is no history of Cancer, Diabetes, or Coronary artery disease. ? ?ROS:   ?Please see the history of  present illness.    ?*** All other systems reviewed and are negative. ? ?EKGs/Labs/Other Studies Reviewed:   ? ?The following studies were reviewed today: ?*** ? ?EKG:  EKG is *** ordered today.  The ekg ordered today demonstrates *** ? ?Recent Labs: ?01/21/2022: ALT 11; B Natriuretic Peptide 73.3; BUN 12; Creatinine, Ser 1.04; Hemoglobin 9.8; Magnesium 1.8; Platelets 257; Potassium 3.7; Sodium 140; TSH 1.012  ?Recent Lipid Panel ?   ?Component Value Date/Time  ? CHOL 157 08/30/2018 1620  ? TRIG 84 08/30/2018 1620  ? HDL 64 08/30/2018 1620  ? CHOLHDL 2.5 08/30/2018 1620  ? CHOLHDL 4.1 02/04/2015 1619  ? VLDL 17 02/04/2015 1619  ? [place] 76 08/30/2018 1620  ? ? ? ?Risk Assessment/Calculations:   ?{Does this patient have ATRIAL FIBRILLATION?:[phone]} ? ?    ? ?Physical Exam:   ? ?VS:  There were no vitals taken for this visit.   ? ?Wt Readings from Last 3 Encounters:  ?07/28/21 205 lb (93 kg)  ?04/05/21 211 lb 1.6 oz (95.8 kg)  ?08/26/20 219 lb 9.6 oz (99.6 kg)  ?  ? ?GEN: *** Well nourished, well developed in no acute distress ?HEENT: Normal ?NECK: No JVD; No carotid bruits ?LYMPHATICS: No lymphadenopathy ?CARDIAC: ***RRR, no murmurs, rubs, gallops ?RESPIRATORY:  Clear to auscultation without rales, wheezing or rhonchi  ?ABDOMEN: Soft, non-tender, non-distended ?MUSCULOSKELETAL:  No edema; No deformity  ?SKIN: Warm and dry ?NEUROLOGIC:  Alert and oriented x 3 ?PSYCHIATRIC:  Normal affect  ? ?ASSESSMENT:   ? ?#Palpitations: will obtain cardiac event monitor to assess for arrhythmia. If she does have an arrhythmia will plan for cardiac MRI to assess for cardiac sarcoid vs PET if this is available. ? ?PLAN:   ? ?In order of problems listed above: ? ?*** ? ?   ? ?{Are you ordering a CV Procedure (e.g. stress test, cath, DCCV, TEE, etc)?   Press F2        :[id number]}  ? ? ?Medication Adjustments/Labs and Tests Ordered: ?Current medicines are reviewed at length with the patient today.  Concerns regarding medicines are  outlined above.  ?No orders of the defined types were placed in this encounter. ? ?No orders of the defined types were placed in this encounter. ? ? ?There are no Patient Instructions on file for this visit.  ? ?Signed, ?[name], MD  ?02/08/2022 2:19 PM    ?[place] ?

## 2022-02-13 ENCOUNTER — Ambulatory Visit: Payer: Medicare Other | Admitting: Adult Health

## 2022-02-13 ENCOUNTER — Other Ambulatory Visit: Payer: Self-pay | Admitting: Family

## 2022-02-13 DIAGNOSIS — R932 Abnormal findings on diagnostic imaging of liver and biliary tract: Secondary | ICD-10-CM

## 2022-02-17 ENCOUNTER — Inpatient Hospital Stay: Admission: RE | Admit: 2022-02-17 | Payer: Medicare Other | Source: Ambulatory Visit

## 2022-02-24 ENCOUNTER — Inpatient Hospital Stay: Admission: RE | Admit: 2022-02-24 | Payer: Medicare Other | Source: Ambulatory Visit

## 2022-03-01 ENCOUNTER — Ambulatory Visit
Admission: RE | Admit: 2022-03-01 | Discharge: 2022-03-01 | Disposition: A | Discharge status: Home / Self Care | Payer: Medicare Other | Source: Ambulatory Visit | Attending: Family | Admitting: Family

## 2022-03-01 DIAGNOSIS — R932 Abnormal findings on diagnostic imaging of liver and biliary tract: Secondary | ICD-10-CM

## 2022-03-02 ENCOUNTER — Other Ambulatory Visit: Payer: Self-pay | Admitting: Hematology and Oncology

## 2022-03-02 DIAGNOSIS — Z170 Estrogen receptor positive status [ER+]: Secondary | ICD-10-CM

## 2022-03-08 ENCOUNTER — Ambulatory Visit: Payer: Medicare Other | Admitting: Internal Medicine

## 2022-03-08 ENCOUNTER — Ambulatory Visit: Payer: Medicare Other | Admitting: Adult Health

## 2022-03-08 NOTE — Progress Notes (Deleted)
Subjective:    Patient ID: [name], female   DOB: [birth year]    MRN: [id number]    Brief patient profile:  74  yobf quit smoking 10/2004 dx by transbronchial biopsy 5/92 with NCG  consistent with sarcoid. Since that time she's been off and on prednisone multiple  times with flares of coughing dyspnea and skin involvement > weaned off chronic prednisone Feb 2012 and placed back on 10/10/16 for deteriorating obstructive changes on pfts  With resp failure/ 02 dep        History of Present Illness  02/20/2017  f/u ov/[name] re:   GOLD III/ pred 10 mg daily and bevespi 2bid  and alb rare  Chief Complaint  Patient presents with   Follow-up    6wk rov. pt states breathing is baseline. pt reports of sob with exertion & occ prod cough with green mucus mainly in the morning   Doe better :  [facility] = can't walk a nl pace on a flat grade s sob but does fine slow and flat eg shopping on  2lpm / ok at rest s 02  Uses 02 2lpm sleeping  rec Drop prednisone to 10 mg one half daily       11/26/2017  f/u ov/[name] re:  Sarcoid/skin involvement chronic cough ? Sinus dz/ using med calendar well / pednisone 10 mg daily  Chief Complaint  Patient presents with   Follow-up    Cough has improved some, but still producing some greenish yellow sputum first thing in the am.  She has not had to use her albuterol inhaler. She c/o shakiness in her hands for the past couple of months.    less cough p augmentin but still am sputum green / no noct symptoms Doing well with rehab but sometimes has to turn 02 up to 4 lpm there but doesn't do so in other settings and concerned about wt gain  rec Start plaquenil 200 mg daily  Ok to adjust the prednisone 73m  from 1-2 pills in am as per med calendar See calendar for specific medication instructions   11/22/2018  f/u ov/[name] re:  Sarcooid/ copd/ 02 dep resp failure/ indolent onset x 3  weeks no appetite fatigue  Increased sob on pred 5 mg daily / no med calendar  Chief  Complaint  Patient presents with   Follow-up    Pt c/o increased SOB and cough the past few days. Her cough is prod at times with yellow to clear sputum. She states her mouth is very dry in the mornings. Her appetite is poor and she is down 9 lbs since the last visit. She is using her albuterol inhaler 2 x per wk on average.    Dyspnea:  mb and back flat ok on 3lpm Pulsed when walks  Cough: none Sleeping: on side bed is horizontal SABA use: couple of times a week 02: 2lpm and up to 3lpm walk rec Please remember to go to the lab department   for your tests - we will call you with the results when they are available. 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 She uses her albuterol inhaler about 1-2 x per week. She rarely uses neb. She has been having occ cough, esp in the am with thick, green sputum.   Dyspnea:  grocery shopping [employer] with sats  typically above 90% on 2 POC  Cough: green x one week, getting better with increase pred to 5 mg from 5-2.5-5  Sleeping: bed is flat, 2 pillows  SABA use: as above 02: 2lpm Covid status:   vax x 2, never infected  Mouth dry "feels like a rash"  Rec Zpak should clear the green mucus  When breathing / cough bad > prednisone  5 mg x 2 until better then work back to 2.5 mg daily  Make sure you check your oxygen saturation  at your highest level of  activity Get the new booster for covid 19    03/08/2022  f/u ov/[name] re: ***   maint on ***  No chief complaint on file.   Dyspnea:  *** Cough: *** Sleeping: *** SABA use: *** 02: *** Covid status:   ***   No obvious day to day or daytime variability or assoc excess/ purulent sputum or mucus plugs or hemoptysis or cp or chest tightness, subjective wheeze or overt sinus or hb symptoms.   *** without nocturnal  or early am exacerbation  of respiratory  c/o's or need for noct saba. Also denies any obvious fluctuation of symptoms with weather or environmental changes or other aggravating or alleviating factors except as outlined above   No unusual exposure hx or h/o childhood pna/ asthma or knowledge of premature birth.  Current Allergies, Complete Past Medical History, Past Surgical History, Family History, and Social History were reviewed in [employer] record.  ROS  The following are not active complaints unless bolded Hoarseness, sore throat, dysphagia, dental problems, itching, sneezing,  nasal congestion or discharge of excess mucus or purulent secretions, ear ache,   fever, chills, sweats, unintended wt loss or wt gain, classically pleuritic or exertional cp,  orthopnea pnd or arm/hand swelling  or leg swelling, presyncope, palpitations, abdominal pain, anorexia, nausea, vomiting, diarrhea  or change in bowel habits or change in bladder habits, change in stools or change in urine, dysuria, hematuria,  rash, arthralgias, visual complaints, headache, numbness, weakness or ataxia or problems with walking or coordination,  change in mood or  memory.        No outpatient medications have been marked as taking for the 03/08/22 encounter (Appointment) with Tanda Rockers, MD.                      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 Unice Cobble  -h/o Daily prednisone since 2007   > off completely Feb 2012 with no problem> restarted  09/2016  ? of SUPERFICIAL VEIN THROMBOSIS (ICD-453.9)   ENDOMETRIAL POLYP (ICD-621.0)   UTERINE FIBROID (ICD-218.9)  RHINITIS, ALLERGIC (ICD-477.9)  HYPERTENSION, BENIGN SYSTEMIC (ICD-401.1)  COPD (ICD-496)  - PFT's 06/26/08 FEV1 1.09 ratio  - PFTs 03/02/10 FEV1 1.06 ratio 34  - PFT's 08/01/2011 FEV1 1.6 (42%)  41 ratio  DLCO 55 corrects 77 - HFA  50%  06/05/2011  > 75% 06/29/2011  - Spiriva trial March 02, 2010 > ? Some better but no worse off 04/2011 - Rehab started mid aug 2018  BACK PAIN, LOW (ICD-724.2)  ANEMIA, IRON DEFICIENCY, UNSPEC. (ICD-280.9)  Health Maintenance.......................................................   [facility]   Family History:  crohn`s in daughter, M-lupus, htn, asthma,  no Ca, DM, CAD    Social History:   lives with twin children and grandson. single; >20 pack year history, quit in 2005; no EtOH  Laid off  from works for [employer] Sept 2013           Objective:   Physical Exam  Wts  03/08/2022    ***  07/28/2021    205  08/26/2020  219  11/17/2019      223   12/30/2018  188  wt 166 Oct 12, 2009 >170 October 28, 2010 > 169 06/29/2011 >>171 08/24/2011 > 01/15/2012  171 > 04/24/2012 176 > 06/28/2012  175> 08/13/2012 > 08/30/2012  173 > 170 11/28/2012 > 01/29/2013  176 > 174 >173 06/16/2013 > 171 09/18/2013 > 12/19/2013 177 >179 04/13/2014>  04/23/2014 177 > 08/21/2014 184 >182 11/27/2014 > 05/12/2015 177  > 05/11/2016   177 > 10/27/2016  175 > 11/09/2016 182 > 01/09/2017 198 > 02/20/2017  206 >  07/17/2017   209 >  09/03/2017   210 > 09/28/2017  206  > 11/26/2017   209 > 01/10/2018  210> 02/19/2018  206> 05/21/2018  198 > 08/06/2018  197 > 08/21/2018  196 > 11/22/2018  187     Vital signs reviewed  03/08/2022  - Note at rest 02 sats  ***% on ***   General appearance:    ***   Reports edentulous***        Assessment:

## 2022-03-11 NOTE — Progress Notes (Deleted)
Cardiology Office Note:    Date:  03/11/2022   ID:  [name], DOB [birth year], MRN [id number]  PCP:  Erin Side., FNP   [facility] Providers Cardiologist:  None {  Referring MD: Erin Side., FNP    History of Present Illness:    [name] is a 61 y.o. female with a hx of HTN, COPD, Stage IA breast cancer of the right breast, and sarcoidosis who was referred by Erin Folks, FNP for further evaluation of palpitations.  Patient was seen in the ER on 01/21/22 with palpitations. Note reviewed. She had called EMS due to 2 days of palpitations and a "funny feeling" in her left chest. She was noted to be in bigeminy by EMS per report. In the ER, trop negative x2, HgB 9.8, TSH normal, Cr 1.04. CTA with no evidence of PE but demonstrated aortic and coronary calicification as well as 10m solid pulmonary nodule in the RUL, probable hepatic cirrhosis, and diffuse emphysema with fibrosis in the lungs. Work-up was reassuring and she was discharged home.  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    Overweight (BMI 25.0-29.9) 05/11/2016   Palpitations    Personal history of radiation therapy 2016   Sarcoidosis STABLE PER CXR JUNE 2013   Shortness of breath    Sickle cell trait ([facility]     Past Surgical History:  Procedure Laterality Date   ADJUSTABLE SUTURE MANIPULATION  05/22/2012   Procedure: ADJUSTABLE SUTURE MANIPULATION;  Surgeon: MDara Hoyer MD;  [facility]  Service: Ophthalmology;  Laterality: Right;   BREAST LUMPECTOMY Right 2016   CESAREAN SECTION  1986   W/ BILATERAL TUBAL LIGATION    COLONOSCOPY WITH PROPOFOL N/A 02/21/2019   Procedure: COLONOSCOPY WITH PROPOFOL;  Surgeon: PIrene Shipper MD;  Location: [facility];  Service: Endoscopy;  Laterality: N/A;   EYE SURGERY     MEDIAN RECTUS REPAIR  05/22/2012   Procedure: MEDIAN RECTUS REPAIR;  Surgeon: MDara Hoyer MD;  [facility]  Service: Ophthalmology;  Laterality: Bilateral;  INFERIOR RECTUS RESECTION WITH ADJUSTIBLE SUTURES RIGHT EYE    POLYPECTOMY  02/21/2019   Procedure: POLYPECTOMY;  Surgeon: PIrene Shipper MD;  Location: [facility];  Service: Endoscopy;;   RADIOACTIVE SEED GUIDED PARTIAL MASTECTOMY WITH AXILLARY SENTINEL LYMPH NODE BIOPSY Right 08/18/2015   Procedure: RADIOACTIVE SEED GUIDED PARTIAL MASTECTOMY WITH AXILLARY SENTINEL LYMPH NODE BIOPSY;  Surgeon: FStark Klein MD;  [place] [facility]  Service: General;  Laterality: Right;   TOTAL ROBOTIC ASSISTED LAPAROSCOPIC HYSTERECTOMY  12-30-2010   SYMPTOMATIC UTERINE FIBROIDS   UPPER TEETH EXTRACTION'S  1992    Current Medications: No outpatient medications have been marked as taking for the 03/15/22 encounter (Appointment) with PFreada Bergeron MD.     Allergies:   Codeine   Social History   Socioeconomic History   Marital status: Single    Spouse name: Not on file   Number of children: Not on file   Years of education: Not on file  Highest education level: Not on file  Occupational History   Not on file  Tobacco Use   Smoking status: Former    Packs/day: 1.00    Years: 20.00    Pack years: 20.00    Types: Cigarettes    Quit date: 11/14/2003    Years since quitting: 18.3   Smokeless tobacco: Never  Vaping Use   Vaping Use: Never used  Substance and Sexual Activity   Alcohol use: No   Drug use: No   Sexual activity: Not on file  Other Topics Concern   Not on file  Social History Narrative   Lives with mother and his 3 children.  Works as a Clinical research associate for [employer], product/process development)          Social Determinants of Radio broadcast assistant Strain: Not on file  Food Insecurity: Not on file  Transportation Needs: Not on file  Physical Activity: Not on file  Stress: Not on file  Social Connections: Not on file     Family History: The patient's ***family history includes Asthma in her mother; Breast cancer (age of onset: 16) in her mother; Hyperlipidemia in her father and mother; Hypertension in her brother, father, and sister; Lupus in her mother. There is no history of Cancer, Diabetes, or Coronary artery disease.  ROS:   Please see the history of present illness.    *** All other systems reviewed and are negative.  EKGs/Labs/Other Studies Reviewed:    The following studies were reviewed today: CTA 2022/02/15: FINDINGS: Cardiovascular: Good opacification of the central and segmental pulmonary arteries. No focal filling defects. No evidence of significant pulmonary embolus. Normal heart size. Normal caliber thoracic aorta. No aortic dissection. Calcification of the aorta and coronary arteries. Great vessel origins are patent although there is evidence of moderate stenosis of the origin of the left common carotid artery.   Mediastinum/Nodes: Thyroid gland is unremarkable. Esophagus is decompressed. No significant lymphadenopathy.   Lungs/Pleura: Prominent emphysematous changes in the lungs with scattered fibrosis. Peribronchial thickening and bronchiectasis consistent with chronic bronchitis. Spiculated mass in the right upper lung measuring 1.8 x 2.2 cm. This is new since the previous study. Scarring in the lung bases. No pleural effusions.   Upper Abdomen: Cirrhotic changes suggested in the liver with nodular contour and enlargement of the lateral segment left lobe. No acute abnormalities demonstrated in the visualized upper abdomen.   Musculoskeletal: Degenerative changes in the spine. No destructive bone lesions.   Review of the MIP images confirms the  above findings.   IMPRESSION: 1. No evidence of significant pulmonary embolus. 2. 20 mm suspicious solid pulmonary nodule within the right upper lobe. Consider a non-contrast Chest CT at 3 months, a PET/CT, or tissue sampling. 3. Diffuse emphysema and scattered fibrosis in the lungs. 4. Probable hepatic cirrhosis.   These guidelines do not apply to immunocompromised patients and patients with cancer. Follow up in patients with significant comorbidities as clinically warranted. For lung cancer screening, adhere to Lung-RADS guidelines. Reference: Radiology. 2017; 284(1):228-43.  EKG:  EKG is *** ordered today.  The ekg ordered today demonstrates ***  Recent Labs: 02-15-22: ALT 11; B Natriuretic Peptide 73.3; BUN 12; Creatinine, Ser 1.04; Hemoglobin 9.8; Magnesium 1.8; Platelets 257; Potassium 3.7; Sodium 140; TSH 1.012  Recent Lipid Panel    Component Value Date/Time   CHOL 157 08/30/2018 1620   TRIG 84 08/30/2018 1620   HDL 64 08/30/2018 1620   CHOLHDL 2.5 08/30/2018 1620   CHOLHDL 4.1 02/04/2015 1619  VLDL 17 02/04/2015 1619   LDLCALC 76 08/30/2018 1620     Risk Assessment/Calculations:   {Does this patient have ATRIAL FIBRILLATION?[phone]}       Physical Exam:    VS:  There were no vitals taken for this visit.    Wt Readings from Last 3 Encounters:  07/28/21 205 lb (93 kg)  04/05/21 211 lb 1.6 oz (95.8 kg)  08/26/20 219 lb 9.6 oz (99.6 kg)     GEN: *** Well nourished, well developed in no acute distress HEENT: Normal NECK: No JVD; No carotid bruits LYMPHATICS: No lymphadenopathy CARDIAC: ***RRR, no murmurs, rubs, gallops RESPIRATORY:  Clear to auscultation without rales, wheezing or rhonchi  ABDOMEN: Soft, non-tender, non-distended MUSCULOSKELETAL:  No edema; No deformity  SKIN: Warm and dry NEUROLOGIC:  Alert and oriented x 3 PSYCHIATRIC:  Normal affect   ASSESSMENT:    No diagnosis found. PLAN:    In order of problems listed  above:  #Palpitations: #Ventricular Bigeminy: Patient with recent ER visit in 01/2022 with 2 days of palpitations found to be in bigeminy per report by EMS. Symptoms resolved while in the ER and work-up reassuring with negative trop and no ischemic changes on ECG. Will check zio for further evaluation -Check cardiac monitor -Check TTE  #Coronary Calcification: -Check TTE as above -Continue lipitor '20mg'$  daily -Continue ASA '81mg'$  daily  #COPD: -Follow-up with Dr. Melvyn Novas  #Sarcoid: -On plaquenil -Follows with Pulm  #HTN: -Continue triamterene-HCTZ 37.5-'25mg'$  daily      {Are you ordering a CV Procedure (e.g. stress test, cath, DCCV, TEE, etc)?   Press F2        :[id number]}    Medication Adjustments/Labs and Tests Ordered: Current medicines are reviewed at length with the patient today.  Concerns regarding medicines are outlined above.  No orders of the defined types were placed in this encounter.  No orders of the defined types were placed in this encounter.   There are no Patient Instructions on file for this visit.   Signed, Freada Bergeron, MD  03/11/2022 3:17 PM    [place]

## 2022-03-14 ENCOUNTER — Ambulatory Visit (INDEPENDENT_AMBULATORY_CARE_PROVIDER_SITE_OTHER): Payer: Medicare Other | Admitting: Internal Medicine

## 2022-03-14 ENCOUNTER — Encounter: Payer: Self-pay | Admitting: Internal Medicine

## 2022-03-14 DIAGNOSIS — K7460 Unspecified cirrhosis of liver: Secondary | ICD-10-CM

## 2022-03-14 DIAGNOSIS — J449 Chronic obstructive pulmonary disease, unspecified: Secondary | ICD-10-CM | POA: Diagnosis not present

## 2022-03-14 DIAGNOSIS — J9611 Chronic respiratory failure with hypoxia: Secondary | ICD-10-CM

## 2022-03-14 DIAGNOSIS — R911 Solitary pulmonary nodule: Secondary | ICD-10-CM

## 2022-03-14 DIAGNOSIS — D869 Sarcoidosis, unspecified: Secondary | ICD-10-CM | POA: Diagnosis not present

## 2022-03-14 DIAGNOSIS — J9612 Chronic respiratory failure with hypercapnia: Secondary | ICD-10-CM

## 2022-03-14 MED ORDER — PREDNISONE 10 MG PO TABS: ORAL_TABLET | ORAL | 0 refills | Status: DC

## 2022-03-14 NOTE — Patient Instructions (Addendum)
We will be referring you to Dr Henrene Pastor re your ultraound findings ? ?Make sure you check your oxygen saturation  AT  your highest level of activity (not after you stop)   to be sure it stays over 90% and adjust  02 flow upward to maintain this level if needed but remember to turn it back to previous settings when you stop (to conserve your supply).  ? ?1st of June increase your prednisone to 20 mg daily until the CT scan mid  June  2023 and I'll call you the results - wean back to where you are are  ? ?Please schedule a follow up visit in 3 months but call sooner if needed  ? ? ? ? ? ? ?

## 2022-03-14 NOTE — Progress Notes (Signed)
?Subjective:  ?  ?Patient ID: [name], female   DOB: 05-Mar-1961    MRN: [id number] ? ? ? ?Brief patient profile:  ?51  yobf quit smoking 10/2004 dx by transbronchial biopsy 5/92 with NCG  consistent with sarcoid. Since that time she's been off and on prednisone multiple  times with flares of coughing dyspnea and skin involvement > weaned off chronic prednisone Feb 2012 and placed back on 10/10/16 for deteriorating obstructive changes on pfts  With resp failure/ 02 dep  ?  ?  ?  ?History of Present Illness ? 02/20/2017  f/u ov/Felicha Frayne re:   GOLD III/ pred 10 mg daily and bevespi 2bid  and alb rare  ?Chief Complaint  ?Patient presents with  ? Follow-up  ?  6wk rov. pt states breathing is baseline. pt reports of sob with exertion & occ prod cough with green mucus mainly in the morning  ? Doe better :  MMRC2 = can't walk a nl pace on a flat grade s sob but does fine slow and flat eg shopping on  2lpm / ok at rest s 02  ?Uses 02 2lpm sleeping  ?rec ?Drop prednisone to 10 mg one half daily  ? ? ?   ?11/26/2017  f/u ov/Megahn Killings re:  Sarcoid/skin involvement chronic cough ? Sinus dz/ using med calendar well / pednisone 10 mg daily  ?Chief Complaint  ?Patient presents with  ? Follow-up  ?  Cough has improved some, but still producing some greenish yellow sputum first thing in the am.  She has not had to use her albuterol inhaler. She c/o shakiness in her hands for the past couple of months.   ? less cough p augmentin but still am sputum green / no noct symptoms ?Doing well with rehab but sometimes has to turn 02 up to 4 lpm there but doesn't do so in other settings and concerned about wt gain  ?rec ?Start plaquenil 200 mg daily  ?Ok to adjust the prednisone '5mg'$   from 1-2 pills in am as per med calendar ?See calendar for specific medication instructions ?  ?11/22/2018  f/u ov/Joani Cosma re:  Sarcooid/ copd/ 02 dep resp failure/ indolent onset x 3  weeks no appetite fatigue  Increased sob on pred 5 mg daily / no med calendar  ?Chief  Complaint  ?Patient presents with  ? Follow-up  ?  Pt c/o increased SOB and cough the past few days. Her cough is prod at times with yellow to clear sputum. She states her mouth is very dry in the mornings. Her appetite is poor and she is down 9 lbs since the last visit. She is using her albuterol inhaler 2 x per wk on average.   ? Dyspnea:  mb and back flat ok on 3lpm Pulsed when walks  ?Cough: none ?Sleeping: on side bed is horizontal ?SABA use: couple of times a week ?02: 2lpm and up to 3lpm walk ?rec ?Please remember to go to the lab department   for your tests - we will call you with the results when they are available. ?Take 20 prednisone daily until better then 10 mg per day and w/in 3 days got worse more /sob/ decreased sats so admit 12/17/18 ? ? ?Admit date: 12/17/2018 ?Discharge date: 12/22/2018 ?  ?  ?DISCHARGE DIAGNOSES:  ?Multifocal pneumonia likely community-acquired ?Acute COPD exacerbation, improved ?Acute on chronic respiratory failure with hypoxia ?History of sarcoidosis on chronic steroids ?Acute kidney injury, resolved ?Essential hypertension ?History of breast cancer ? ? ?  07/28/2021  f/u ov/Benjie Ricketson re: sarcoid / gold 4 copd/ 02 dep  maint on stiolto x 2/ pred 5-2.5 -5   ?Chief Complaint  ?Patient presents with  ? Follow-up  ?  Breathing is overall doing well. She uses her albuterol inhaler about 1-2 x per week. She rarely uses neb. She has been having occ cough, esp in the am with thick, green sputum.   ?Dyspnea:  grocery shopping [employer] with sats  typically above 90% on 2 POC  ?Cough: green x one week, getting better with increase pred to 5 mg from 5-2.5-5  ?Sleeping: bed is flat, 2 pillows  ?SABA use: as above ?02: 2lpm ?Covid status:   vax x 2, never infected  ?Mouth dry "feels like a rash"  ?Rec ?Zpak should clear the green mucus  ?When breathing / cough bad > prednisone  5 mg x 2 until better then work back to 2.5 mg daily  ?Make sure you check your oxygen saturation  at your highest level of  activity ?Get the new booster for covid 19  ? ? ?03/14/2022  f/u ov/Merryn Thaker re: sarcoidosis/ copd gold 4 02 dep    maint on stiolto 2 puffs each am   ?Chief Complaint  ?Patient presents with  ? Follow-up  ?  ER in April, saw a nodule.  ?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 / not using hc parking  ?Cough: ok presently  ?Sleeping: flat bed 2 pillows  ?SABA use: once or twice a week  ?02: 2lpm hs ,  walking does 3lpm POC ?  ? ? ?No obvious day to day or daytime variability or assoc excess/ purulent sputum or mucus plugs or hemoptysis or cp or chest tightness, subjective wheeze or overt sinus or hb symptoms.  ? ?sleeping without nocturnal  or early am exacerbation  of respiratory  c/o's or need for noct saba. Also denies any obvious fluctuation of symptoms with weather or environmental changes or other aggravating or alleviating factors except as outlined above  ? ?No unusual exposure hx or h/o childhood pna/ asthma or knowledge of premature birth. ? ?Current Allergies, Complete Past Medical History, Past Surgical History, Family History, and Social History were reviewed in [employer] record. ? ?ROS  The following are not active complaints unless bolded ?Hoarseness, sore throat, dysphagia, dental problems, itching, sneezing,  nasal congestion or discharge of excess mucus or purulent secretions, ear ache,   fever, chills, sweats, unintended wt loss or wt gain, classically pleuritic or exertional cp,  orthopnea pnd or arm/hand swelling  or leg swelling, presyncope, palpitations, abdominal pain, anorexia, nausea, vomiting, diarrhea  or change in bowel habits or change in bladder habits, change in stools or change in urine, dysuria, hematuria,  rash, arthralgias, visual complaints, headache, numbness, weakness or ataxia or problems with walking or coordination,  change in mood or  memory. ?      ? ?Current Meds  ?Medication Sig  ? albuterol (PROVENTIL) (2.5 MG/3ML) 0.083% nebulizer  solution Take 3 mLs (2.5 mg total) by nebulization every 6 (six) hours as needed for wheezing or shortness of breath.  ? albuterol (VENTOLIN HFA) 108 (90 Base) MCG/ACT inhaler INHALE 2 PUFFS EVERY 6 HOURS AS NEEDED FOR WHEEZING OR SHORTNESS OF BREATH (Patient taking differently: Inhale 2 puffs into the lungs every 6 (six) hours as needed for shortness of breath or wheezing.)  ? anastrozole (ARIMIDEX) 1 MG tablet TAKE 1 TABLET BY MOUTH  DAILY  ?  aspirin EC 81 MG tablet Take 1 tablet (81 mg total) by mouth daily. Start 02/23/19  ? atorvastatin (LIPITOR) 20 MG tablet TAKE 1 TABLET (20 MG TOTAL) BY MOUTH DAILY.  ? ferrous sulfate 325 (65 FE) MG tablet Take 1 tablet (325 mg total) by mouth daily with breakfast. (Patient taking differently: Take 325 mg by mouth every other day.)  ? fluticasone (FLONASE) 50 MCG/ACT nasal spray USE 2 SPRAYS IN BOTH  NOSTRILS DAILY AS NEEDED  FOR ALLERGIES  ? gabapentin (NEURONTIN) 300 MG capsule TAKE 1 CAPSULE BY MOUTH IN  THE MORNING AND 2 CAPSULES  IN THE EVENING (Patient taking differently: Take 300 mg by mouth 3 (three) times daily. One cap in the morning 2 cap at night)  ? Guaifenesin 1200 MG TB12 Take 1,200 mg by mouth 2 (two) times daily as needed (chest congestion).  ? hydroxychloroquine (PLAQUENIL) 200 MG tablet Take 1 tablet (200 mg total) by mouth daily.  ? meloxicam (MOBIC) 7.5 MG tablet Take 1 tablet (7.5 mg total) by mouth daily.  ? methocarbamol (ROBAXIN) 500 MG tablet Take 1 tablet (500 mg total) by mouth 2 (two) times daily.  ? metoprolol tartrate (LOPRESSOR) 25 MG tablet Take 1 tablet (25 mg total) by mouth 2 (two) times daily.  ? Multiple Vitamin (MULTIVITAMIN) capsule Take 1 capsule by mouth daily.  ? omeprazole (PRILOSEC) 20 MG capsule TAKE 1 CAPSULE EVERY DAY (Patient taking differently: Take 20 mg by mouth daily.)  ? OXYGEN 2lpm with rest and 3lpm with exertion  ? predniSONE (DELTASONE) 5 MG tablet 5 mg take 2 until better then gradually taper to one half daily  ?  sertraline (ZOLOFT) 50 MG tablet TAKE 1 TABLET EVERY DAY (Patient taking differently: Take 50 mg by mouth daily.)  ? Tiotropium Bromide-Olodaterol (STIOLTO RESPIMAT) 2.5-2.5 MCG/ACT AERS Inhale 2 puffs into the lungs

## 2022-03-14 NOTE — Assessment & Plan Note (Signed)
New start 09/2016 at admit see records ?- 10/18/16 ?Patient Saturations on Room Air at Rest = 92% ?Patient Saturations on Room Air while Ambulating = 84% ?Patient Saturations on 2 Liters of oxygen while Ambulating = 90% ?- HCO3  11/09/2016 = 39  ?- HCO3   03/16/17        = 34  ?- HC03    01/10/2018  = 40  ?- HC03  11/22/2018     = 38  ?- HC03  12/20/2018       = 26  ?- HC03  11/17/2019       = 37  ? ? ?11/17/2019 Patient Saturations on Room Air at Rest = 97% ?Patient Saturations on Room Air while Ambulating = 79% ?Patient Saturations on 2 Liters of oxygen while Ambulating = 90% ? ?As of  03/14/2022   rec 2lpm sleeping  and titrate with ambulation to maintain > 90%  ? ?Again advised: ?Make sure you check your oxygen saturation  AT  your highest level of activity (not after you stop)   to be sure it stays over 90% and adjust  02 flow upward to maintain this level if needed but remember to turn it back to previous settings when you stop (to conserve your supply).  ?

## 2022-03-14 NOTE — Assessment & Plan Note (Signed)
Quit smoking 2005 ?- CT chest 01/21/22  RUL x 20 mm > rec f/u in 3 m with pred x 20 mg daily prior ? ?She is low but not "no" risk so rec w/u as above - very poor candidate for surgery but probably could do localized RT ? ?Discussed in detail all the  indications, usual  risks and alternatives  relative to the benefits with patient who agrees to proceed with w/u as outlined.    ?

## 2022-03-14 NOTE — Assessment & Plan Note (Signed)
Positive transbronchial biopsy 02/19/91 by Dr. Unice Cobble  ?-Daily prednisone since 2007 with ? adrenal insufficiency iatrogenic > off completely Feb 2012 with no recurrence ?- PFT's 06/26/08 FEV1  1.09 ratio  ?- PFTs  03/02/10 FEV1  1.06 (42%) ratio 34 and no better p B2,  DLC0 48% corrects to 64% ?- PFT's 08/01/2011 FEV1 1.6 (42%)  41 ratio  DLCO 55 corrects 77 ?- PFTs 10/10/16    FEV1  0.87(38)ratio 41 and DLCO 36/39c corrects to 51 for alv vol > placed back on daily pred  ?- 02/20/2017 decreased pred to 5 mg daily > to 2.70m daily as of 07/17/2017  ?- 07/17/2017 try 2.5 mg qod > 09/28/2017 changed to 20 mg until better and new floor of 5 mg daily plus consider plaquenil due to new skin involvement on face  ?- opth eval neg 11/10/17 ?- Start plaquenil 200 mg daily 11/26/2017  ?- 01/10/2018 ESR up to 97 with pred taper so rec bid plaquenil > could only tol 200 mg daily  ?- 02/19/2018 ESR = 94 on 591mdaily  ?- 05/21/2018 referred to dermatology  ?- 08/21/2018   ESR = 80  On prednisone 10 mg/ plaquenil 200 mg daily > tapered to floor of 5 mg daily  ?- 11/22/2018  ESR  = 113 on pred 5 mg and plaq 200 mg with calcium 12.1 so rec pred 20 mg ub then new floor of 10 mg daily  ?- 02/24/2019 clinically improved and doing fine on Plaquenil 200 plus pred  10 mg daily so rec rechalleng with slow taper to 5 mg daily   ?- 07/28/21   ACE  Level  38 (down from 45 x 4 y prior ) ? ?The goal with a chronic steroid dependent illness is always arriving at the lowest effective dose that controls the disease/symptoms and not accepting a set "formula" which is based on statistics or guidelines that don't always take into account patient  variability or the natural hx of the dz in every individual patient, which may well vary over time.  For now therefore I recommend the patient maintain  5 a/w 2.5 mg daily  ? ?Prior to f/u CT chest in mid June will increase pred to 20 mg daily x 14 days in hopes the nodule (see SPN) is inflammatory/ sarcoid related.  ?

## 2022-03-14 NOTE — Assessment & Plan Note (Signed)
Quit smoking 2005 ? - PFT's 06/26/08 FEV1 1.09 ratio  ?- PFTs 03/02/10 FEV1 1.06 ratio 34  ?  PFT's 08/01/2011 FEV1 1.6 (42%)  41 ratio  DLCO 55 corrects 77 ?- Spiriva trial March 02, 2010 > ? better but not worse off it 04/2011 ?- 05/12/2015 p extensive coaching HFA effectiveness =    90%  ?- Referred to Rehab 08/21/2014 > could not arrange due to schedule  ?-med calendar 06/16/2013 > did not bring to office as requested 12/19/13 or 04/23/14  Or 08/21/2014  ?- 11/09/2016    changed symb to bevespi since taking chronic pred for pf  ?  ?- 02/20/2017 decreased pred to 5 mg daily > weaned to 2.5 mg qd as of 07/17/2017  ?- started rehab mid Aug 2018  ?- 07/17/2017 changed to pred 2.5 even days > did not tolerate so changed  Around  07/28/17 back to 5 mg qod  ?- Spirometry 09/03/2017  FEV1 0.52 (23%)  Ratio 40 with typical curvature   ?- PFT's  02/19/2018  FEV1 0.65 (29 % ) ratio 43  p 1 % improvement from saba p stiolto/ pred 5 mg  prior to study with DLCO  39 % corrects to 56  % for alv volume ?- 12/30/2018  After extensive coaching inhaler device,  effectiveness =    95%   With smi > continue stiolto  ? ?Pt is Group B in terms of symptom/risk and laba/lama therefore appropriate rx at this point >>>  Continue stiolto and prn saba / using appropriately   ? ?  ? ?  ?

## 2022-03-15 ENCOUNTER — Ambulatory Visit: Payer: Medicare Other | Admitting: Cardiology

## 2022-04-03 ENCOUNTER — Emergency Department (HOSPITAL_COMMUNITY)
Admission: EM | Admit: 2022-04-03 | Discharge: 2022-04-03 | Disposition: A | Discharge status: Home / Self Care | Payer: Medicare Other | Attending: Emergency Medicine | Admitting: Emergency Medicine

## 2022-04-03 ENCOUNTER — Other Ambulatory Visit: Payer: Self-pay

## 2022-04-03 DIAGNOSIS — Z7982 Long term (current) use of aspirin: Secondary | ICD-10-CM | POA: Diagnosis not present

## 2022-04-03 DIAGNOSIS — Z7952 Long term (current) use of systemic steroids: Secondary | ICD-10-CM | POA: Diagnosis not present

## 2022-04-03 DIAGNOSIS — I10 Essential (primary) hypertension: Secondary | ICD-10-CM | POA: Insufficient documentation

## 2022-04-03 DIAGNOSIS — B029 Zoster without complications: Secondary | ICD-10-CM | POA: Insufficient documentation

## 2022-04-03 DIAGNOSIS — Z853 Personal history of malignant neoplasm of breast: Secondary | ICD-10-CM | POA: Diagnosis not present

## 2022-04-03 DIAGNOSIS — Z7951 Long term (current) use of inhaled steroids: Secondary | ICD-10-CM | POA: Insufficient documentation

## 2022-04-03 DIAGNOSIS — Z79899 Other long term (current) drug therapy: Secondary | ICD-10-CM | POA: Insufficient documentation

## 2022-04-03 DIAGNOSIS — R21 Rash and other nonspecific skin eruption: Secondary | ICD-10-CM | POA: Diagnosis present

## 2022-04-03 DIAGNOSIS — J449 Chronic obstructive pulmonary disease, unspecified: Secondary | ICD-10-CM | POA: Insufficient documentation

## 2022-04-03 MED ORDER — ACYCLOVIR 400 MG PO TABS: 800.0000 mg | ORAL_TABLET | Freq: Every day | ORAL | 0 refills | Status: AC

## 2022-04-03 MED ORDER — ACETAMINOPHEN 500 MG PO TABS: 1000.0000 mg | ORAL_TABLET | Freq: Once | ORAL | Status: DC

## 2022-04-03 MED ORDER — HYDROCODONE-ACETAMINOPHEN 5-325 MG PO TABS: 0.5000 | ORAL_TABLET | Freq: Three times a day (TID) | ORAL | 0 refills | Status: AC | PRN

## 2022-04-03 MED ORDER — ACYCLOVIR 200 MG PO CAPS
800.0000 mg | ORAL_CAPSULE | Freq: Once | ORAL | Status: AC
  Administered 2022-04-03: 800 mg via ORAL
  Filled 2022-04-03: qty 4

## 2022-04-03 MED ORDER — HYDROCODONE-ACETAMINOPHEN 5-325 MG PO TABS
0.5000 | ORAL_TABLET | Freq: Once | ORAL | Status: AC
  Administered 2022-04-03: 0.5 via ORAL
  Filled 2022-04-03: qty 1

## 2022-04-03 MED ORDER — ACETAMINOPHEN 325 MG PO TABS
650.0000 mg | ORAL_TABLET | Freq: Once | ORAL | Status: AC
  Administered 2022-04-03: 650 mg via ORAL
  Filled 2022-04-03: qty 2

## 2022-04-03 NOTE — ED Provider Notes (Signed)
[name] DEPT Provider Note  CSN: [id number] Arrival date & time: 04/03/22 0347  Chief Complaint(s) No chief complaint on file.  HPI [name] is a 61 y.o. female with a past medical history listed below who presents to the emergency department for several days of lower back and right lower extremity rash.  She reports that the rash began 3 to 4 days ago in her lower back.  She saw her PCP who prescribed her topical medicine.  At that time she reports being given shot for hip pain but is uncertain what medicine it is.  Since the rash spreads she thought this might of been an allergic reaction.  She is denying any significant shortness of breath, or swelling.  The rash is painful with burning sensation.  No alleviating or aggravating factor.  No associated swelling.  No other physical complaints.  The history is provided by the patient.   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    Overweight (BMI 25.0-29.9) 05/11/2016   Palpitations    Personal history of radiation therapy 2016   Sarcoidosis STABLE PER CXR JUNE 2013   Shortness of breath    Sickle cell trait ([place])    Patient Active Problem List   Diagnosis Date Noted   Cirrhosis ([place]) 03/14/2022   Acute right-sided low back pain without sciatica 03/28/2019   Benign neoplasm of ascending colon    Anemia 01/10/2019   Chronic cough 11/26/2017   Obesity (BMI 30-39.9) 01/10/2017   Dyspnea on exertion 11/09/2016   Chronic respiratory failure with hypoxia and hypercapnia ([place]) 10/29/2016   Depression with anxiety 08/07/2016   Hot flashes  02/01/2016   Breast cancer of upper-outer quadrant of right female breast ([place]) 08/04/2015   GERD (gastroesophageal reflux disease) 12/21/2012   Hyperlipidemia 12/21/2012   Solitary pulmonary nodule on lung CT 12/21/2012   SICKLE-CELL TRAIT 04/15/2008   Sarcoidosis ([place]) 01/10/2007   HYPERTENSION, BENIGN SYSTEMIC 01/10/2007   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 Authorizing Provider  acyclovir (ZOVIRAX) 400 MG tablet Take 2 tablets (800 mg total) by mouth 5 (five) times daily for 7 days. 04/03/22 04/10/22 Yes Micaela Stith, Grayce Sessions, MD  HYDROcodone-acetaminophen (NORCO/VICODIN) 5-325 MG tablet Take 0.5-1 tablets by mouth every 8 (eight) hours as needed for up to 5 days for severe pain (That is not improved by your scheduled acetaminophen regimen). Please do not exceed 4000 mg of acetaminophen (Tylenol) a 24-hour period. Please note that he may be prescribed additional medicine that contains acetaminophen. 04/03/22 04/08/22 Yes Debralee Braaksma, Grayce Sessions, MD  albuterol (PROVENTIL) (2.5 MG/3ML) 0.083% nebulizer solution Take 3 mLs (2.5 mg total) by nebulization every 6 (six) hours as needed for wheezing or shortness of breath. 12/22/18   Bonnielee Haff, MD  albuterol (VENTOLIN HFA) 108 (90 Base) MCG/ACT inhaler INHALE 2 PUFFS EVERY 6 HOURS AS NEEDED FOR WHEEZING OR SHORTNESS OF BREATH Patient taking differently: Inhale 2 puffs  into the lungs every 6 (six) hours as needed for shortness of breath or wheezing. 09/23/19   Tanda Rockers, MD  anastrozole (ARIMIDEX) 1 MG tablet TAKE 1 TABLET BY MOUTH  DAILY 03/02/22   Nicholas Lose, MD  aspirin EC 81 MG tablet Take 1 tablet (81 mg total) by mouth daily. Start 02/23/19 02/21/19   Pokhrel, Corrie Mckusick, MD  atorvastatin (LIPITOR) 20 MG tablet TAKE 1 TABLET (20 MG TOTAL) BY MOUTH DAILY. 03/28/21   Benay Pike,  MD  ferrous sulfate 325 (65 FE) MG tablet Take 1 tablet (325 mg total) by mouth daily with breakfast. Patient taking differently: Take 325 mg by mouth every other day. 02/21/19   Pokhrel, Corrie Mckusick, MD  fluticasone (FLONASE) 50 MCG/ACT nasal spray USE 2 SPRAYS IN BOTH  NOSTRILS DAILY AS NEEDED  FOR ALLERGIES 08/15/21   Tanda Rockers, MD  gabapentin (NEURONTIN) 300 MG capsule TAKE 1 CAPSULE BY MOUTH IN  THE MORNING AND 2 CAPSULES  IN THE EVENING Patient taking differently: Take 300 mg by mouth 3 (three) times daily. One cap in the morning 2 cap at night 11/30/21   Nicholas Lose, MD  Guaifenesin 1200 MG TB12 Take 1,200 mg by mouth 2 (two) times daily as needed (chest congestion).    [provider]  hydroxychloroquine (PLAQUENIL) 200 MG tablet Take 1 tablet (200 mg total) by mouth daily. 08/02/21   Tanda Rockers, MD  meloxicam (MOBIC) 7.5 MG tablet Take 1 tablet (7.5 mg total) by mouth daily. 07/18/19   Benay Pike, MD  methocarbamol (ROBAXIN) 500 MG tablet Take 1 tablet (500 mg total) by mouth 2 (two) times daily. 02/18/20   Tacy Learn, PA-C  metoprolol tartrate (LOPRESSOR) 25 MG tablet Take 1 tablet (25 mg total) by mouth 2 (two) times daily. 03/09/21   Benay Pike, MD  Multiple Vitamin (MULTIVITAMIN) capsule Take 1 capsule by mouth daily.    [provider]  omeprazole (PRILOSEC) 20 MG capsule TAKE 1 CAPSULE EVERY DAY Patient taking differently: Take 20 mg by mouth daily. 01/03/21   Benay Pike, MD  OXYGEN 2lpm with rest and 3lpm with exertion    [provider]  predniSONE (DELTASONE) 10 MG tablet 2 daily x 2 weeks 03/14/22   Tanda Rockers, MD  predniSONE (DELTASONE) 5 MG tablet 5 mg take 2 until better then gradually taper to one half daily 10/19/21   Tanda Rockers, MD  sertraline (ZOLOFT) 50 MG tablet TAKE 1 TABLET EVERY DAY Patient taking differently: Take 50 mg by mouth daily. 09/11/20   Benay Pike, MD  Tiotropium Bromide-Olodaterol (STIOLTO RESPIMAT)  2.5-2.5 MCG/ACT AERS Inhale 2 puffs into the lungs daily. 11/16/21   Tanda Rockers, MD  triamterene-hydrochlorothiazide (GQQPYPP-50) 37.5-25 MG tablet TAKE 1 TABLET EVERY DAY Patient taking differently: Take 1 tablet by mouth daily. TAKE 1 TABLET EVERY DAY 06/02/21   Tanda Rockers, MD  Allergies Codeine  Review of Systems Review of Systems As noted in HPI  Physical Exam Vital Signs  I have reviewed the triage vital signs BP 132/80   Pulse 68   Temp 99.7 F (37.6 C) (Oral)   Resp 17   Ht '5\' 5"'$  (1.651 m)   Wt 90.3 kg   SpO2 96%   BMI 33.12 kg/m   Physical Exam Vitals reviewed.  Constitutional:      General: She is not in acute distress.    Appearance: She is well-developed. She is not diaphoretic.  HENT:     Head: Normocephalic and atraumatic.     Right Ear: External ear normal.     Left Ear: External ear normal.     Nose: Nose normal.  Eyes:     General: No scleral icterus.    Conjunctiva/sclera: Conjunctivae normal.  Neck:     Trachea: Phonation normal.  Cardiovascular:     Rate and Rhythm: Normal rate and regular rhythm.  Pulmonary:     Effort: Pulmonary effort is normal. No respiratory distress.     Breath sounds: No stridor.  Abdominal:     General: There is no distension.  Musculoskeletal:        General: Normal range of motion.     Cervical back: Normal range of motion.  Skin:    Findings: Rash present. Rash is vesicular (from sacral region to right thigh. see images). Rash is not urticarial.  Neurological:     Mental Status: She is alert and oriented to person, place, and time.  Psychiatric:        Behavior: Behavior normal.         ED Results and Treatments Labs (all labs ordered are listed, but only abnormal results are displayed) Labs Reviewed - No data to display                                                                                                                        EKG  EKG Interpretation  Date/Time:    Ventricular Rate:    PR Interval:    QRS Duration:   QT Interval:    QTC Calculation:   R Axis:     Text Interpretation:         Radiology No results found.  Pertinent labs & imaging results that were available during my care of the patient were reviewed by me and considered in my medical decision making (see MDM for details).  Medications Ordered in ED Medications  acyclovir (ZOVIRAX) 200 MG capsule 800 mg (800 mg Oral Given 04/03/22 0429)  acetaminophen (TYLENOL) tablet 650 mg (650 mg Oral Given 04/03/22 0429)  HYDROcodone-acetaminophen (NORCO/VICODIN) 5-325 MG per tablet 0.5 tablet (0.5 tablets Oral Given 04/03/22 0430)  Procedures Procedures  (including critical care time)  Medical Decision Making / ED Course    Complexity of Problem:  Co-morbidities/SDOH that complicate the patient evaluation/care: COPD currently on steroids Sarcoidosis on Paxil  Patient's presenting problem/concern, DDX, and MDM listed below: Rash  Appears to be consistent with shingles. No superimposed infection. Not consistent with allergic reaction.  Hospitalization Considered:  No  Initial Intervention:  Acyclovir, Tylenol, and Norco.    Complexity of Data:     ED Course:    Assessment, Add'l Intervention, and Reassessment: Shingles Will DC with Acyclovir and analgesia PCP follow up recommended    Final Clinical Impression(s) / ED Diagnoses Final diagnoses:  Thigh shingles   The patient appears reasonably screened and/or stabilized for discharge and I doubt any other medical condition or other [facility] requiring further screening, evaluation, or treatment in the ED at this time prior to discharge. Safe for discharge with strict return  precautions.  Disposition: Discharge  Condition: Good  I have discussed the results, Dx and Tx plan with the patient/family who expressed understanding and agree(s) with the plan. Discharge instructions discussed at length. The patient/family was given strict return precautions who verbalized understanding of the instructions. No further questions at time of discharge.    ED Discharge Orders          Ordered    HYDROcodone-acetaminophen (NORCO/VICODIN) 5-325 MG tablet  Every 8 hours PRN        04/03/22 0637    acyclovir (ZOVIRAX) 400 MG tablet  5 times daily        04/03/22 5784             Follow Up: [address] [place] [address] [phone]  Call  to schedule an appointment for close follow up           This chart was dictated using voice recognition software.  Despite best efforts to proofread,  errors can occur which can change the documentation meaning.    Fatima Blank, MD 04/03/22 [phone]

## 2022-04-03 NOTE — ED Triage Notes (Signed)
BIB EMS from home for rash on Rt thigh were she received an injection for hip pain  '50mg'$  IV Benadryl 22 LAC

## 2022-04-03 NOTE — Discharge Instructions (Signed)
For pain control you may take at 1000 mg of Tylenol every 8 hours scheduled.  In addition you can take 0.5 to 1 tablet of Vicodin every 8 hours as needed for pain not controlled with the scheduled Tylenol.

## 2022-04-05 ENCOUNTER — Ambulatory Visit: Payer: Medicare HMO | Admitting: Hematology and Oncology

## 2022-04-07 ENCOUNTER — Telehealth: Payer: Self-pay | Admitting: Internal Medicine

## 2022-04-07 MED ORDER — STIOLTO RESPIMAT 2.5-2.5 MCG/ACT IN AERS
2.0000 | INHALATION_SPRAY | Freq: Every day | RESPIRATORY_TRACT | 0 refills | Status: DC
Start: 2022-04-07 — End: 2022-08-18

## 2022-04-07 NOTE — Addendum Note (Signed)
Addended by: Elby Beck R on: 04/07/2022 03:21 PM   Modules accepted: Orders

## 2022-04-07 NOTE — Telephone Encounter (Signed)
Called patient and she states she just wanted to ask Dr. Melvyn Novas if there were any  Stiolto sample available for pick up at the  [facility] office. Will check with [facility] staff and call patient back if any samples are available.

## 2022-04-07 NOTE — Telephone Encounter (Signed)
Called and notified patient that there are samples in [place] per [place]. She is aware office closes at 5pm and doesn't open again until Tuesday. She is going to send someone to pick up the sample since she has shingles.   Nothing further needed at this time.

## 2022-04-12 NOTE — Progress Notes (Deleted)
Cardiology Office Note:    Date:  04/12/2022   ID:  [name], DOB 1961-01-20, MRN [id number]  PCP:  Erin Side., FNP   [facility] Providers Cardiologist:  None {  Referring MD: [name],*    History of Present Illness:    [name] is a 61 y.o. female with a hx of HTN, COPD, Stage IA breast cancer of the right breast, and sarcoidosis who was referred by Erin Folks, FNP for further evaluation of palpitations.  Patient was seen in the ER on 01/21/22 with palpitations. Note reviewed. She had called EMS due to 2 days of palpitations and a "funny feeling" in her left chest. She was noted to be in bigeminy by EMS per report. In the ER, trop negative x2, HgB 9.8, TSH normal, Cr 1.04. CTA with no evidence of PE but demonstrated aortic and coronary calicification as well as 50m solid pulmonary nodule in the RUL, probable hepatic cirrhosis, and diffuse emphysema with fibrosis in the lungs. Work-up was reassuring and she was discharged home.  Today, ***  Past Medical History:  Diagnosis Date   [place]   Allergic rhinitis    [place]ast [place]   right breast   COPD, mild (HCC) FOLLOWED BY DR WXTKW  GERD (gastroesophageal reflux disease)    HTN (hypertension)    Hypertension    Iron deficiency anemia    Long-term current use of steroids SYMBICORT INHALER   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    Overweight (BMI 25.0-29.9) 05/11/2016   Palpitations    Personal history of radiation therapy 2016   Sarcoidosis STABLE PER CXR JUNE 2013   Shortness of breath    S[place]le cell trait [place]     Past Surgical History:  Procedure Laterality Date   ADJUSTABLE SUTURE MANIPULATION  05/22/2012   Procedure: ADJUSTABLE SUTURE MANIPULATION;  Surgeon: MDara Hoyer MD;  [facility]  Service: Ophthalmology;  Laterality: Right;   BREAST LUMPECTOMY Right 2016   CESAREAN SECTION  1986   W/ BILATERAL TUBAL  LIGATION   COLONOSCOPY WITH PROPOFOL N/A 02/21/2019   Procedure: COLONOSCOPY WITH PROPOFOL;  Surgeon: PIrene Shipper MD;  [facility];  Service: Endoscopy;  Laterality: N/A;   EYE SURGERY     MEDIAN RECTUS REPAIR  05/22/2012   Procedure: MEDIAN RECTUS REPAIR;  Surgeon: MDara Hoyer MD;  [facility]  Service: Ophthalmology;  Laterality: Bilateral;  INFERIOR RECTUS RESECTION WITH ADJUSTIBLE SUTURES RIGHT EYE    POLYPECTOMY  02/21/2019   Procedure: POLYPECTOMY;  Surgeon: PIrene Shipper MD;  [facility];  Service: Endoscopy;;   RADIOACTIVE SEED GUIDED PARTIAL MASTECTOMY WITH AXILLARY SENTINEL LYMPH NODE BIOPSY Right 08/18/2015   Procedure: RADIOACTIVE SEED GUIDED PARTIAL MASTECTOMY WITH AXILLARY SENTINEL LYMPH NODE BIOPSY;  Surgeon: FStark Klein MD;  [place]  Service: General;  Laterality: Right;   TOTAL ROBOTIC ASSISTED LAPAROSCOPIC HYSTERECTOMY  12-30-2010   SYMPTOMATIC UTERINE FIBROIDS   UPPER TEETH EXTRACTION'S  1992    Current Medications: No outpatient medications have been marked as taking for the 04/18/22 encounter (Appointment) with PFreada Bergeron MD.     Allergies:   Codeine   Social History   Socioeconomic History   Marital status: Single    Spouse name: Not on file   Number of children: Not on file   Years of education: Not on file   Highest education  level: Not on file  Occupational History   Not on file  Tobacco Use   Smoking status: Former    Packs/day: 1.00    Years: 20.00    Pack years: 20.00    Types: Cigarettes    Quit date: 11/14/2003    Years since quitting: 18.4   Smokeless tobacco: Never  Vaping Use   Vaping Use: Never used  Substance and Sexual Activity   Alcohol use: No   Drug use: No   Sexual activity: Not on file  Other Topics Concern   Not on file  Social History Narrative   Lives with mother and his 3 children.  Works as a Clinical research associate for [employer], product/process development)          Social Determinants of Radio broadcast assistant Strain: Not on file  Food Insecurity: Not on file  Transportation Needs: Not on file  Physical Activity: Not on file  Stress: Not on file  Social Connections: Not on file     Family History: The patient's ***family history includes Asthma in her mother; Breast cancer (age of onset: 59) in her mother; Hyperlipidemia in her father and mother; Hypertension in her brother, father, and sister; Lupus in her mother. There is no history of Cancer, Diabetes, or Coronary artery disease.  ROS:   Please see the history of present illness.    *** All other systems reviewed and are negative.  EKGs/Labs/Other Studies Reviewed:    The following studies were reviewed today: CTA 2022/02/13: FINDINGS: Cardiovascular: Good opacification of the central and segmental pulmonary arteries. No focal filling defects. No evidence of significant pulmonary embolus. Normal heart size. Normal caliber thoracic aorta. No aortic dissection. Calcification of the aorta and coronary arteries. Great vessel origins are patent although there is evidence of moderate stenosis of the origin of the left common carotid artery.   Mediastinum/Nodes: Thyroid gland is unremarkable. Esophagus is decompressed. No significant lymphadenopathy.   Lungs/Pleura: Prominent emphysematous changes in the lungs with scattered fibrosis. Peribronchial thickening and bronchiectasis consistent with chronic bronchitis. Spiculated mass in the right upper lung measuring 1.8 x 2.2 cm. This is new since the previous study. Scarring in the lung bases. No pleural effusions.   Upper Abdomen: Cirrhotic changes suggested in the liver with nodular contour and enlargement of the lateral segment left lobe. No acute abnormalities demonstrated in the visualized upper abdomen.   Musculoskeletal: Degenerative changes in the spine. No destructive bone lesions.   Review of the MIP images confirms the  above findings.   IMPRESSION: 1. No evidence of significant pulmonary embolus. 2. 20 mm suspicious solid pulmonary nodule within the right upper lobe. Consider a non-contrast Chest CT at 3 months, a PET/CT, or tissue sampling. 3. Diffuse emphysema and scattered fibrosis in the lungs. 4. Probable hepatic cirrhosis.   These guidelines do not apply to immunocompromised patients and patients with cancer. Follow up in patients with significant comorbidities as clinically warranted. For lung cancer screening, adhere to Lung-RADS guidelines. Reference: Radiology. 2017; 284(1):228-43.  EKG:  EKG is *** ordered today.  The ekg ordered today demonstrates ***  Recent Labs: 2022/02/13: ALT 11; B Natriuretic Peptide 73.3; BUN 12; Creatinine, Ser 1.04; Hemoglobin 9.8; Magnesium 1.8; Platelets 257; Potassium 3.7; Sodium 140; TSH 1.012  Recent Lipid Panel    Component Value Date/Time   CHOL 157 08/30/2018 1620   TRIG 84 08/30/2018 1620   HDL 64 08/30/2018 1620   CHOLHDL 2.5 08/30/2018 1620   CHOLHDL 4.1 02/04/2015 1619  VLDL 17 02/04/2015 1619   LDLCALC 76 08/30/2018 1620     Risk Assessment/Calculations:   {Does this patient have ATRIAL FIBRILLATION?:[id number]}       Physical Exam:    VS:  There were no vitals taken for this visit.    Wt Readings from Last 3 Encounters:  04/03/22 199 lb (90.3 kg)  03/14/22 199 lb 9.6 oz (90.5 kg)  07/28/21 205 lb (93 kg)     GEN: *** Well nourished, well developed in no acute distress HEENT: Normal NECK: No JVD; No carotid bruits LYMPHATICS: No lymphadenopathy CARDIAC: ***RRR, no murmurs, rubs, gallops RESPIRATORY:  Clear to auscultation without rales, wheezing or rhonchi  ABDOMEN: Soft, non-tender, non-distended MUSCULOSKELETAL:  No edema; No deformity  SKIN: Warm and dry NEUROLOGIC:  Alert and oriented x 3 PSYCHIATRIC:  Normal affect   ASSESSMENT:    No diagnosis found. PLAN:    In order of problems listed  above:  #Palpitations: #Ventricular Bigeminy: Patient with recent ER visit in 01/2022 with 2 days of palpitations found to be in bigeminy per report by EMS. Symptoms resolved while in the ER and work-up reassuring with negative trop and no ischemic changes on ECG. Will check zio for further evaluation -Check cardiac monitor -Check TTE  #Coronary Calcification: -Check TTE as above -Continue lipitor '20mg'$  daily -Continue ASA '81mg'$  daily  #COPD: -Follow-up with Dr. Melvyn Novas  #Sarcoid: -On plaquenil -Follows with Pulm  #HTN: -Continue triamterene-HCTZ 37.5-'25mg'$  daily      {Are you ordering a CV Procedure (e.g. stress test, cath, DCCV, TEE, etc)?   Press F2        :[id number]}    Medication Adjustments/Labs and Tests Ordered: Current medicines are reviewed at length with the patient today.  Concerns regarding medicines are outlined above.  No orders of the defined types were placed in this encounter.  No orders of the defined types were placed in this encounter.   There are no Patient Instructions on file for this visit.   Signed, Freada Bergeron, MD  04/12/2022 2:57 PM    [facility]

## 2022-04-18 ENCOUNTER — Ambulatory Visit: Payer: Medicare Other | Admitting: Cardiology

## 2022-05-02 NOTE — Progress Notes (Deleted)
Cardiology Office Note:    Date:  05/02/2022   ID:  [name], DOB 11-08-1961, MRN [id number]  PCP:  Erin Side., FNP   [facility] Providers Cardiologist:  None {  Referring MD: [name],*    History of Present Illness:    [name] is a 61 y.o. female with a hx of HTN, COPD, GERD, cirrhosis, sarcoidosis, sickle cell trait, HLD, stage IA breast cancer, and obesity who was referred by [name] for further evaluation of chest pain.  Patient was seen in the ER in 01/21/22 for a "funny feeling in her chest." Note reviewed. In the ER, the patient was noted to be in bigeminy. There, trop negative. BNP, TSH normal. K 3.7, Mg 1.8. CTA chest with no evidence of PE but showed coronary calcification and aortic atherosclerosis as well as a 39m suspicious nodule in RUL . She was discharged home with CV follow-up.  Of note, patient had prior SPECT in 2014 that was normal. TTE 2017 with LVEF 60-65%, no significant valve disease.   Today, ***  Past Medical History:  Diagnosis Date   [place] respiratory [place] 10/09/2016   Allergic rhinitis    [place]ast cancer ([place] 2016   right breast   COPD, mild (HCC) FOLLOWED BY DR WXTGG  GERD (gastroesophageal reflux disease)    HTN (hypertension)    Hypertension    Iron deficiency anemia    Long-term current use of steroids SYMBICORT INHALER   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    Overweight (BMI 25.0-29.9) 05/11/2016   Palpitations    Personal history of radiation therapy 2016   Sarcoidosis STABLE PER CXR JUNE 2013   Shortness of breath    Sickle cell trait ([place]     Past Surgical History:  Procedure Laterality Date   ADJUSTABLE SUTURE MANIPULATION  05/22/2012   Procedure: ADJUSTABLE SUTURE MANIPULATION;  Surgeon: MDara Hoyer MD;  [facility]  Service: Ophthalmology;  Laterality: Right;   BREAST LUMPECTOMY Right 2016   CESAREAN SECTION  1986   W/ BILATERAL TUBAL LIGATION    COLONOSCOPY WITH PROPOFOL N/A 02/21/2019   Procedure: COLONOSCOPY WITH PROPOFOL;  Surgeon: PIrene Shipper MD;  [facility];  Service: Endoscopy;  Laterality: N/A;   EYE SURGERY     MEDIAN RECTUS REPAIR  05/22/2012   Procedure: MEDIAN RECTUS REPAIR;  Surgeon: MDara Hoyer MD;  [facility]  Service: [facility]hthal[facility];  Laterality: Bilateral;  INFERIOR RECTUS RESECTION WITH ADJUSTIBLE SUTURES RIGHT EYE    POLYPECTOMY  02/21/2019   Procedure: POLYPECTOMY;  Surgeon: PIrene Shipper MD;  [facility];  Service: Endoscopy;;   RADIOACTIVE SEED GUIDED PARTIAL MASTECTOMY WITH AXILLARY SENTINEL LYMPH NODE BIOPSY Right 08/18/2015   Procedure: RADIOACTIVE SEED GUIDED PARTIAL MASTECTOMY WITH AXILLARY SENTINEL LYMPH NODE BIOPSY;  Surgeon: Erin Klein MD;  [place]  Service: General;  Laterality: Right;   TOTAL ROBOTIC ASSISTED LAPAROSCOPIC HYSTERECTOMY  12-30-2010   SYMPTOMATIC UTERINE FIBROIDS   UPPER TEETH EXTRACTION'S  1992    Current Medications: No outpatient medications have been marked as taking for the 05/04/22 encounter (Appointment) with PFreada Bergeron MD.     Allergies:   Codeine   Social History   Socioeconomic History   Marital status: Single    Spouse name: Not on file   Number of children: Not on file   Years of education: Not on file   Highest education  level: Not on file  Occupational History   Not on file  Tobacco Use   Smoking status: Former    Packs/day: 1.00    Years: 20.00    Total pack years: 20.00    Types: Cigarettes    Quit date: 11/14/2003    Years since quitting: 18.4   Smokeless tobacco: Never  Vaping Use   Vaping Use: Never used  Substance and Sexual Activity   Alcohol use: No   Drug use: No   Sexual activity: Not on file  Other Topics Concern   Not on file  Social History Narrative   Lives with mother and his 3 children.  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 There is no history of Cancer, Diabetes, or Coronary artery disease.  ROS:   Please see the history of present illness.    *** All other systems reviewed and are negative.  EKGs/Labs/Other Studies Reviewed:    The following studies were reviewed today: CTA 01-24-22: FINDINGS: Cardiovascular: Good opacification of the central and segmental pulmonary arteries. No focal filling defects. No evidence of significant pulmonary embolus. Normal heart size. Normal caliber thoracic aorta. No aortic dissection. Calcification of the aorta and coronary arteries. Great vessel origins are patent although there is evidence of moderate stenosis of the origin of the left common carotid artery.   Mediastinum/Nodes: Thyroid gland is unremarkable. Esophagus is decompressed. No significant lymphadenopathy.   Lungs/Pleura: Prominent emphysematous changes in the lungs with scattered fibrosis. Peribronchial thickening and bronchiectasis consistent with chronic bronchitis. Spiculated mass in the right upper lung measuring 1.8 x 2.2 cm. This is new since the previous study. Scarring in the lung bases. No pleural effusions.   Upper Abdomen: Cirrhotic changes suggested in the liver with nodular contour and enlargement of the lateral segment left lobe. No acute abnormalities demonstrated in the visualized upper abdomen.   Musculoskeletal: Degenerative changes in the spine. No destructive bone lesions.   Review of the MIP images confirms the  above findings.   IMPRESSION: 1. No evidence of significant pulmonary embolus. 2. 20 mm suspicious solid pulmonary nodule within the right upper lobe. Consider a non-contrast Chest CT at 3 months, a PET/CT, or tissue sampling. 3. Diffuse emphysema and scattered fibrosis in the lungs. 4. Probable hepatic cirrhosis.  TTE 2017: Study Conclusions   - Left ventricle: The cavity size was normal. Systolic function was    normal. The estimated ejection fraction was in the range of 60%    to 65%. Wall motion was normal; there were no regional wall    motion abnormalities. Left ventricular diastolic function    parameters were normal.  - Atrial septum: No defect or patent foramen ovale was identified.   SPECT 2014: IMPRESSION:   1.  No reversible ischemia or infarction.  2.  Normal wall motion.  3.  Left ventricular ejection fraction equal 71%   EKG:  EKG is *** ordered today.  The ekg ordered today demonstrates ***  Recent Labs: 01/21/2022: ALT 11; B Natriuretic Peptide 73.3; BUN 12; Creatinine, Ser 1.04; Hemoglobin 9.8; Magnesium 1.8; Platelets 257; Potassium 3.7; Sodium 140; TSH 1.012  Recent Lipid Panel    Component Value Date/Time   CHOL 157 08/30/2018 1620   TRIG 84 08/30/2018 1620   HDL 64 08/30/2018 1620   CHOLHDL 2.5 08/30/2018 1620   CHOLHDL 4.1 02/04/2015 1619   VLDL 17 02/04/2015 1619   LDLCALC 76 08/30/2018 1620     Risk Assessment/Calculations:   {Does this patient have ATRIAL FIBRILLATION?:[id number]}       Physical Exam:    VS:  There were no vitals taken for this visit.    Wt Readings from Last 3 Encounters:  04/03/22 199 lb (90.3 kg)  03/14/22 199 lb 9.6 oz (90.5 kg)  07/28/21 205 lb (93 kg)     GEN: *** Well nourished, well developed in no acute distress HEENT: Normal NECK: No JVD; No carotid bruits LYMPHATICS: No lymphadenopathy CARDIAC: ***RRR, no murmurs, rubs, gallops RESPIRATORY:  Clear to auscultation without rales, wheezing or rhonchi   ABDOMEN: Soft, non-tender, non-distended MUSCULOSKELETAL:  No edema; No deformity  SKIN: Warm and dry NEUROLOGIC:  Alert and oriented x 3 PSYCHIATRIC:  Normal affect   ASSESSMENT:    No diagnosis found. PLAN:    In order of problems listed above:  #Chest Pain: #Ventricular Bigeminy: Patient presented to ER in 01/2022 with chest discomfort found to be in bigeminy. ER work-up reassuring as detailed above. Currently **** -Check TTE -Continue metop '25mg'$  BID   #Coronary Artery Calcification: Noted on CT chest. Currently, ***  #HLD: -Continue lipitor '20mg'$  daily  #COPD: #Pulmonary Sarcoid: -Continue home inhalers -Followed by Dr. Westley Foots you ordering a CV Procedure (e.g. stress test, cath, DCCV, TEE, etc)?   Press F2        :[id number]}    Medication Adjustments/Labs and Tests Ordered: Current medicines are reviewed at length with the patient today.  Concerns regarding medicines are outlined above.  No orders of the defined types were placed in this encounter.  No orders of the defined types were placed in this encounter.   There are no Patient Instructions on file for this visit.   Signed, Erin Bergeron, MD  05/02/2022 7:48 PM    [place]

## 2022-05-04 ENCOUNTER — Ambulatory Visit: Payer: Medicare Other | Admitting: Cardiology

## 2022-05-04 NOTE — Progress Notes (Incomplete)
Cardiology Office Note:    Date:  05/04/2022   ID:  [name], DOB [birth year], MRN [id number]  PCP:  Sonia Side., FNP   [facility] Providers Cardiologist:  None {  Referring MD: Fatima Blank,*    History of Present Illness:    [name] is a 61 y.o. female with a hx of HTN, COPD, GERD, cirrhosis, sarcoidosis, sickle cell trait, HLD, stage IA breast cancer, and obesity who was referred by Dr. Leonette Monarch for further evaluation of chest pain.  Patient was seen in the ER in 01/21/22 for a "funny feeling in her chest." Note reviewed. In the ER, the patient was noted to be in bigeminy. There, trop negative. BNP, TSH normal. K 3.7, Mg 1.8. CTA chest with no evidence of PE but showed coronary calcification and aortic atherosclerosis as well as a 64m suspicious nodule in RUL . She was discharged home with CV follow-up.  Of note, patient had prior SPECT in 2014 that was normal. TTE 2017 with LVEF 60-65%, no significant valve disease.     Today, ***  The patient denies chest pain***, shortness of breath***, nocturnal dyspnea***, orthopnea*** or peripheral edema***.  There have been no palpitations***, lightheadedness*** or syncope***.  Complains of ***.   Past Medical History:  Diagnosis Date   Acute on chronic respiratory failure ([facility] 10/09/2016   Allergic rhinitis    Breast cancer ([facility] 2016   right breast   COPD, mild (HCC) FOLLOWED BY DR WDXAJ  GERD (gastroesophageal reflux disease)    HTN (hypertension)    Hypertension    Iron deficiency anemia    Long-term current use of steroids SYMBICORT INHALER   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    Overweight (BMI 25.0-29.9) 05/11/2016   Palpitations    Personal history of radiation therapy 2016   Sarcoidosis STABLE PER CXR JUNE 2013   Shortness of breath    Sickle cell trait ([facility]     Past Surgical History:  Procedure Laterality Date   ADJUSTABLE SUTURE MANIPULATION  05/22/2012   Procedure: ADJUSTABLE SUTURE MANIPULATION;   Surgeon: MDara Hoyer MD;  [facility]  Service: Ophthalmology;  Laterality: Right;   BREAST LUMPECTOMY Right 2016   CESAREAN SECTION  1986   W/ BILATERAL TUBAL LIGATION   COLONOSCOPY WITH PROPOFOL N/A 02/21/2019   Procedure: COLONOSCOPY WITH PROPOFOL;  Surgeon: PIrene Shipper MD;  [facility]: [facility];  Service: Endoscopy;  Laterality: N/A;   EYE SURGERY     MEDIAN RECTUS REPAIR  05/22/2012   Procedure: MEDIAN RECTUS REPAIR;  Surgeon: MDara Hoyer MD;  [facility]  Service: Ophthalmology;  Laterality: Bilateral;  INFERIOR RECTUS RESECTION WITH ADJUSTIBLE SUTURES RIGHT EYE    POLYPECTOMY  02/21/2019   Procedure: POLYPECTOMY;  Surgeon: PIrene Shipper MD;  Location: [facility];  Service: Endoscopy;;   RADIOACTIVE SEED GUIDED PARTIAL MASTECTOMY WITH AXILLARY SENTINEL LYMPH NODE BIOPSY Right 08/18/2015   Procedure: RADIOACTIVE SEED GUIDED PARTIAL MASTECTOMY WITH AXILLARY SENTINEL LYMPH NODE BIOPSY;  Surgeon: FStark Klein MD;  [facility]  Service: General;  Laterality: Right;   TOTAL ROBOTIC ASSISTED LAPAROSCOPIC HYSTERECTOMY  12-30-2010   SYMPTOMATIC UTERINE FIBROIDS   UPPER TEETH EXTRACTION'S  1992    Current Medications: No outpatient medications have been marked as taking for the 05/04/22 encounter (Appointment) with PFreada Bergeron MD.     Allergies:   Codeine   Social History   Socioeconomic History  Marital status: Single    Spouse name: Not on file   Number of children: Not on file   Years of education: Not on file   Highest education level: Not on file  Occupational History   Not on file  Tobacco Use   Smoking status: Former    Packs/day: 1.00    Years: 20.00    Total pack years: 20.00    Types: Cigarettes    Quit date: 11/14/2003    Years since quitting: 18.4   Smokeless tobacco: Never  Vaping Use   Vaping Use: Never used  Substance and Sexual Activity   Alcohol use: No    Drug use: No   Sexual activity: Not on file  Other Topics Concern   Not on file  Social History Narrative   Lives with mother and his 3 children.  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 There is no history of Cancer, Diabetes, or Coronary artery disease.  ROS:   Please see the history of present illness.    ***  All other systems reviewed and are negative.  EKGs/Labs/Other Studies Reviewed:    The following studies were reviewed today: CTA 02-17-2022: FINDINGS: Cardiovascular: Good opacification of the central and segmental pulmonary arteries. No focal filling defects. No evidence of significant pulmonary embolus. Normal heart size. Normal caliber thoracic aorta. No aortic dissection. Calcification of the aorta and coronary arteries. Great vessel origins are patent although there is evidence of moderate stenosis of the origin of the left common carotid artery.   Mediastinum/Nodes: Thyroid gland is unremarkable. Esophagus is decompressed. No significant lymphadenopathy.   Lungs/Pleura: Prominent emphysematous changes in the lungs with scattered fibrosis. Peribronchial thickening and bronchiectasis consistent with chronic bronchitis. Spiculated mass in the right upper lung measuring 1.8 x 2.2 cm. This is new since the previous study. Scarring in the lung bases. No pleural effusions.   Upper Abdomen: Cirrhotic changes suggested in the liver with nodular contour and enlargement of the lateral segment  left lobe. No acute abnormalities demonstrated in the visualized upper abdomen.   Musculoskeletal: Degenerative changes in the spine. No destructive bone lesions.   Review of the MIP images confirms the above findings.   IMPRESSION: 1. No evidence of significant pulmonary embolus. 2. 20 mm suspicious solid pulmonary nodule within the right upper lobe. Consider a non-contrast Chest CT at 3 months, a PET/CT, or tissue sampling. 3. Diffuse emphysema and scattered fibrosis in the lungs. 4. Probable hepatic cirrhosis.  TTE 2017: Study Conclusions   - Left ventricle: The cavity size was normal. Systolic function was    normal. The estimated ejection fraction was in the range of 60%    to 65%. Wall motion was normal; there were no regional wall    motion abnormalities. Left ventricular diastolic function    parameters were normal.  - Atrial septum: No defect or patent foramen ovale was identified.   SPECT 2014: IMPRESSION:   1.  No reversible ischemia or infarction.  2.  Normal wall motion.  3.  Left ventricular ejection fraction equal 71%   EKG:  EKG is personally reviewed.   05/04/2022 EKG: Rate ***. Sinus Rhythm ***.  Recent Labs: 01/21/2022: ALT 11; B Natriuretic Peptide 73.3; BUN 12; Creatinine, Ser 1.04; Hemoglobin 9.8; Magnesium 1.8; Platelets 257; Potassium 3.7; Sodium 140; TSH 1.012  Recent Lipid Panel    Component Value Date/Time   CHOL 157 08/30/2018 1620   TRIG 84 08/30/2018 1620   HDL 64 08/30/2018 1620   CHOLHDL 2.5 08/30/2018 1620   CHOLHDL 4.1 02/04/2015 1619   VLDL 17 02/04/2015 1619   LDLCALC 76 08/30/2018 1620     Risk Assessment/Calculations:   {Does this patient have ATRIAL FIBRILLATION?:[phone]}       Physical Exam:    VS:  There were no vitals taken for this visit.    Wt Readings from Last 3 Encounters:  04/03/22 199 lb (90.3 kg)  03/14/22 199 lb 9.6 oz (90.5 kg)  07/28/21 205 lb (93 kg)     GEN: *** Well nourished, well developed in  no acute distress HEENT: Normal NECK: No JVD; No carotid bruits LYMPHATICS: No lymphadenopathy CARDIAC: ***RRR, no murmurs, rubs, gallops RESPIRATORY:  Clear to auscultation without rales, wheezing or rhonchi  ABDOMEN: Soft, non-tender, non-distended MUSCULOSKELETAL:  No edema; No deformity  SKIN: Warm and dry NEUROLOGIC:  Alert and oriented x 3 PSYCHIATRIC:  Normal affect   ASSESSMENT:    No diagnosis found. PLAN:    In order of problems listed above:  #Chest Pain: #Ventricular Bigeminy: Patient presented to ER in 01/2022 with chest discomfort found to be in bigeminy. ER work-up reassuring as detailed above. Currently **** -Check TTE -Continue metop '25mg'$  BID   #Coronary Artery Calcification: Noted on CT chest. Currently, ***  #HLD: -Continue lipitor '20mg'$  daily  #COPD: #Pulmonary Sarcoid: -Continue home inhalers -Followed by Dr. Westley Foots you ordering a CV Procedure (e.g. stress test, cath, DCCV, TEE, etc)?   Press F2        :[id number]}  F/U in *** weeks/months/year   Medication Adjustments/Labs and Tests Ordered: Current medicines are reviewed at length with the patient today.  Concerns regarding medicines are outlined above.  No orders of the defined types were placed in this encounter.  No orders of the defined types were placed in this encounter.   There are no Patient Instructions on file for this visit.    ***

## 2022-06-02 ENCOUNTER — Encounter: Payer: Self-pay | Admitting: Internal Medicine

## 2022-06-03 ENCOUNTER — Other Ambulatory Visit: Payer: Self-pay | Admitting: Internal Medicine

## 2022-06-03 DIAGNOSIS — D869 Sarcoidosis, unspecified: Secondary | ICD-10-CM

## 2022-06-18 NOTE — Progress Notes (Deleted)
Subjective:    Patient ID: [name], female   DOB: [birth year]    MRN: [id number]    Brief patient profile:  74  yobf quit smoking 10/2004 dx by transbronchial biopsy 5/92 with NCG  consistent with sarcoid. Since that time she's been off and on prednisone multiple  times with flares of coughing dyspnea and skin involvement > weaned off chronic prednisone Feb 2012 and placed back on 10/10/16 for deteriorating obstructive changes on pfts  With resp failure/ 02 dep        History of Present Illness  02/20/2017  f/u ov/[name] re:   GOLD III/ pred 10 mg daily and bevespi 2bid  and alb rare  Chief Complaint  Patient presents with   Follow-up    6wk rov. pt states breathing is baseline. pt reports of sob with exertion & occ prod cough with green mucus mainly in the morning   Doe better :  [facility] = can't walk a nl pace on a flat grade s sob but does fine slow and flat eg shopping on  2lpm / ok at rest s 02  Uses 02 2lpm sleeping  rec Drop prednisone to 10 mg one half daily       11/26/2017  f/u ov/[name] re:  Sarcoid/skin involvement chronic cough ? Sinus dz/ using med calendar well / pednisone 10 mg daily  Chief Complaint  Patient presents with   Follow-up    Cough has improved some, but still producing some greenish yellow sputum first thing in the am.  She has not had to use her albuterol inhaler. She c/o shakiness in her hands for the past couple of months.    less cough p augmentin but still am sputum green / no noct symptoms Doing well with rehab but sometimes has to turn 02 up to 4 lpm there but doesn't do so in other settings and concerned about wt gain  rec Start plaquenil 200 mg daily  Ok to adjust the prednisone 73m  from 1-2 pills in am as per med calendar See calendar for specific medication instructions   11/22/2018  f/u ov/[name] re:  Sarcooid/ copd/ 02 dep resp failure/ indolent onset x 3  weeks no appetite fatigue  Increased sob on pred 5 mg daily / no med calendar  Chief  Complaint  Patient presents with   Follow-up    Pt c/o increased SOB and cough the past few days. Her cough is prod at times with yellow to clear sputum. She states her mouth is very dry in the mornings. Her appetite is poor and she is down 9 lbs since the last visit. She is using her albuterol inhaler 2 x per wk on average.    Dyspnea:  mb and back flat ok on 3lpm Pulsed when walks  Cough: none Sleeping: on side bed is horizontal SABA use: couple of times a week 02: 2lpm and up to 3lpm walk rec Please remember to go to the lab department   for your tests - we will call you with the results when they are available. 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 She uses her albuterol inhaler about 1-2 x per week. She rarely uses neb. She has been having occ cough, esp in the am with thick, green sputum.   Dyspnea:  grocery shopping [employer] with sats  typically above 90% on 2 POC  Cough: green x one week, getting better with increase pred to 5 mg from 5-2.5-5  Sleeping: bed is flat, 2 pillows  SABA use: as above 02: 2lpm Covid status:   vax x 2, never infected  Mouth dry "feels like a rash"  Rec Zpak should clear the green mucus  When breathing / cough bad > prednisone  5 mg x 2 until better then work back to 2.5 mg daily  Make sure you check your oxygen saturation  at your highest level of  activity Get the new booster for covid 19    03/14/2022  f/u ov/[name] re: sarcoidosis/ copd gold 4 02 dep    maint on stiolto 2 puffs each am   Chief Complaint  Patient presents with   Follow-up    ER in April, saw a nodule.  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 / not using [employer] parking  Cough: ok presently  Sleeping: flat bed 2 pillows  SABA use: once or twice a week  02: 2lpm hs ,  walking does 3lpm POC  Rec We will be referring you to Dr Henrene Pastor re your ultraound findings Make sure you check your oxygen saturation  AT  your highest level of activity (not after you stop)   to be sure it stays over 90%  1st of June increase your prednisone to 20 mg daily until the CT scan mid  June  2023 > not done     06/19/2022  f/u ov/[name] re: ***   maint on ***  No chief complaint on file.   Dyspnea:  *** Cough: *** Sleeping: *** SABA use: *** 02: *** Covid status:   ***   No obvious day to day or daytime variability or assoc excess/ purulent sputum or mucus plugs or hemoptysis or cp or chest tightness, subjective wheeze or overt sinus or hb symptoms.   *** without nocturnal  or early am exacerbation  of respiratory  c/o's or need for noct saba. Also denies any obvious fluctuation of symptoms with weather or environmental changes or other aggravating or alleviating factors except as outlined above   No unusual exposure hx or h/o childhood pna/ asthma or knowledge of premature birth.  Current Allergies, Complete Past Medical History, Past Surgical History, Family History, and Social History were reviewed in [employer] record.  ROS  The following are not active complaints unless bolded Hoarseness, sore throat, dysphagia, dental problems, itching, sneezing,  nasal congestion or discharge of excess mucus or purulent secretions, ear ache,   fever, chills, sweats, unintended wt loss or wt gain, classically pleuritic or exertional cp,  orthopnea pnd or arm/hand  swelling  or leg swelling, presyncope, palpitations, abdominal pain, anorexia, nausea, vomiting, diarrhea  or change in bowel habits or change in bladder habits, change in stools or change in urine, dysuria, hematuria,  rash, arthralgias, visual complaints, headache, numbness, weakness or ataxia or problems with walking or coordination,  change in mood or  memory.        No outpatient medications have been marked as taking for the 06/19/22 encounter (Appointment) with Tanda Rockers, MD.                      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 Unice Cobble  -h/o Daily prednisone since 2007   > off completely Feb 2012 with no problem> restarted  09/2016  ? 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 Some better but no worse off 04/2011 - Rehab started mid aug 2018  BACK PAIN, LOW (ICD-724.2)  ANEMIA, IRON DEFICIENCY, UNSPEC. (ICD-280.9)  Health Maintenance.......................................................   [facility]   Family History:  crohn`s in daughter, M-lupus, htn, asthma,  no Ca, DM, CAD    Social History:   lives with twin children and grandson. single; >20 pack year history, quit in 2005; no EtOH  Laid off  from works for [employer] Sept 2013           Objective:   Physical Exam  Wts  06/19/2022      ***  03/14/2022     199  07/28/2021    205  08/26/2020  219  11/17/2019      223   12/30/2018  188  wt 166 Oct 12, 2009 >170  October 28, 2010 > 169 06/29/2011 >>171 08/24/2011 > 01/15/2012  171 > 04/24/2012 176 > 06/28/2012  175> 08/13/2012 > 08/30/2012  173 > 170 11/28/2012 > 01/29/2013  176 > 174 >173 06/16/2013 > 171 09/18/2013 > 12/19/2013 177 >179 04/13/2014>  04/23/2014 177 > 08/21/2014 184 >182 11/27/2014 > 05/12/2015 177  > 05/11/2016   177 > 10/27/2016  175 > 11/09/2016 182 > 01/09/2017 198 > 02/20/2017  206 >  07/17/2017   209 >  09/03/2017   210 > 09/28/2017  206  > 11/26/2017   209 > 01/10/2018  210> 02/19/2018  206> 05/21/2018  198 > 08/06/2018  197 > 08/21/2018  196 > 11/22/2018  187   Vital signs reviewed  06/19/2022  - Note at rest 02 sats  ***% on ***   General appearance:    ***        Mod bar***        Assessment:

## 2022-06-19 ENCOUNTER — Ambulatory Visit: Payer: Medicare Other | Admitting: Internal Medicine

## 2022-07-06 ENCOUNTER — Ambulatory Visit: Payer: Medicare Other | Admitting: Internal Medicine

## 2022-07-06 NOTE — Progress Notes (Deleted)
Subjective:    Patient ID: [name], female   DOB: [birth year]    MRN: [id number]    Brief patient profile:  74  yobf quit smoking 10/2004 dx by transbronchial biopsy 5/92 with NCG  consistent with sarcoid. Since that time she's been off and on prednisone multiple  times with flares of coughing dyspnea and skin involvement > weaned off chronic prednisone Feb 2012 and placed back on 10/10/16 for deteriorating obstructive changes on pfts  With resp failure/ 02 dep        History of Present Illness  02/20/2017  f/u ov/[name] re:   GOLD III/ pred 10 mg daily and bevespi 2bid  and alb rare  Chief Complaint  Patient presents with   Follow-up    6wk rov. pt states breathing is baseline. pt reports of sob with exertion & occ prod cough with green mucus mainly in the morning   Doe better :  [facility] = can't walk a nl pace on a flat grade s sob but does fine slow and flat eg shopping on  2lpm / ok at rest s 02  Uses 02 2lpm sleeping  rec Drop prednisone to 10 mg one half daily       11/26/2017  f/u ov/[name] re:  Sarcoid/skin involvement chronic cough ? Sinus dz/ using med calendar well / pednisone 10 mg daily  Chief Complaint  Patient presents with   Follow-up    Cough has improved some, but still producing some greenish yellow sputum first thing in the am.  She has not had to use her albuterol inhaler. She c/o shakiness in her hands for the past couple of months.    less cough p augmentin but still am sputum green / no noct symptoms Doing well with rehab but sometimes has to turn 02 up to 4 lpm there but doesn't do so in other settings and concerned about wt gain  rec Start plaquenil 200 mg daily  Ok to adjust the prednisone 73m  from 1-2 pills in am as per med calendar See calendar for specific medication instructions   11/22/2018  f/u ov/[name] re:  Sarcooid/ copd/ 02 dep resp failure/ indolent onset x 3  weeks no appetite fatigue  Increased sob on pred 5 mg daily / no med calendar  Chief  Complaint  Patient presents with   Follow-up    Pt c/o increased SOB and cough the past few days. Her cough is prod at times with yellow to clear sputum. She states her mouth is very dry in the mornings. Her appetite is poor and she is down 9 lbs since the last visit. She is using her albuterol inhaler 2 x per wk on average.    Dyspnea:  mb and back flat ok on 3lpm Pulsed when walks  Cough: none Sleeping: on side bed is horizontal SABA use: couple of times a week 02: 2lpm and up to 3lpm walk rec Please remember to go to the lab department   for your tests - we will call you with the results when they are available. 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 She uses her albuterol inhaler about 1-2 x per week. She rarely uses neb. She has been having occ cough, esp in the am with thick, green sputum.   Dyspnea:  grocery shopping [employer] with sats  typically above 90% on 2 POC  Cough: green x one week, getting better with increase pred to 5 mg from 5-2.5-5  Sleeping: bed is flat, 2 pillows  SABA use: as above 02: 2lpm Covid status:   vax x 2, never infected  Mouth dry "feels like a rash"  Rec Zpak should clear the green mucus  When breathing / cough bad > prednisone  5 mg x 2 until better then work back to 2.5 mg daily  Make sure you check your oxygen saturation  at your highest level of  activity Get the new booster for covid 19    03/14/2022  f/u ov/[name] re: sarcoidosis/ copd gold 4 02 dep    maint on stiolto 2 puffs each am   Chief Complaint  Patient presents with   Follow-up    ER in April, saw a nodule.  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   Dyspnea:  *** Cough: *** Sleeping: *** SABA use: *** 02: *** Covid status:   ***   No obvious day to day or daytime variability or assoc excess/ purulent sputum or mucus plugs or hemoptysis or cp or chest tightness, subjective wheeze or overt sinus or hb symptoms.   *** without nocturnal  or early am exacerbation  of respiratory  c/o's or need for noct saba. Also denies any obvious fluctuation of symptoms with weather or environmental changes or other aggravating or alleviating factors except as outlined above   No unusual exposure hx or h/o childhood pna/ asthma or knowledge of premature birth.  Current Allergies, Complete Past Medical History, Past Surgical History, Family History, and Social History were reviewed in [employer] record.  ROS  The following are not active complaints unless bolded Hoarseness, sore throat, dysphagia, dental problems, itching, sneezing,  nasal congestion or discharge of excess mucus or purulent secretions, ear ache,   fever, chills, sweats, unintended wt loss or wt gain, classically pleuritic or exertional cp,  orthopnea pnd or arm/hand  swelling  or leg swelling, presyncope, palpitations, abdominal pain, anorexia, nausea, vomiting, diarrhea  or change in bowel habits or change in bladder habits, change in stools or change in urine, dysuria, hematuria,  rash, arthralgias, visual complaints, headache, numbness, weakness or ataxia or problems with walking or coordination,  change in mood or  memory.        No outpatient medications have been marked as taking for the 07/06/22 encounter (Appointment) with Tanda Rockers, MD.                      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 Unice Cobble  -h/o Daily prednisone since 2007   > off completely Feb 2012 with no problem> restarted  09/2016  ? 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 Some better but no worse off 04/2011 - Rehab started mid aug 2018  BACK PAIN, LOW (ICD-724.2)  ANEMIA, IRON DEFICIENCY, UNSPEC. (ICD-280.9)  Health Maintenance.......................................................   [facility]   Family History:  crohn`s in daughter, M-lupus, htn, asthma,  no Ca, DM, CAD    Social History:   lives with twin children and grandson. single; >20 pack year history, quit in 2005; no EtOH  Laid off  from works for [employer] Sept 2013           Objective:   Physical Exam  Wts  07/06/2022      ***  03/14/2022     199  07/28/2021    205  08/26/2020  219  11/17/2019      223   12/30/2018  188  wt 166 Oct 12, 2009  >170 October 28, 2010 > 169 06/29/2011 >>171 08/24/2011 > 01/15/2012  171 > 04/24/2012 176 > 06/28/2012  175> 08/13/2012 > 08/30/2012  173 > 170 11/28/2012 > 01/29/2013  176 > 174 >173 06/16/2013 > 171 09/18/2013 > 12/19/2013 177 >179 04/13/2014>  04/23/2014 177 > 08/21/2014 184 >182 11/27/2014 > 05/12/2015 177  > 05/11/2016   177 > 10/27/2016  175 > 11/09/2016 182 > 01/09/2017 198 > 02/20/2017  206 >  07/17/2017   209 >  09/03/2017   210 > 09/28/2017  206  > 11/26/2017   209 > 01/10/2018  210> 02/19/2018  206> 05/21/2018  198 > 08/06/2018  197 > 08/21/2018  196 > 11/22/2018  187   Vital signs reviewed  07/06/2022  - Note at rest 02 sats  ***% on ***   General appearance:    ***        Mod bar***        Assessment:

## 2022-08-18 ENCOUNTER — Encounter: Payer: Self-pay | Admitting: Internal Medicine

## 2022-08-18 ENCOUNTER — Ambulatory Visit (INDEPENDENT_AMBULATORY_CARE_PROVIDER_SITE_OTHER): Payer: Medicare Other

## 2022-08-18 ENCOUNTER — Ambulatory Visit (INDEPENDENT_AMBULATORY_CARE_PROVIDER_SITE_OTHER): Payer: Medicare Other | Admitting: Internal Medicine

## 2022-08-18 DIAGNOSIS — D869 Sarcoidosis, unspecified: Secondary | ICD-10-CM | POA: Diagnosis not present

## 2022-08-18 DIAGNOSIS — J9611 Chronic respiratory failure with hypoxia: Secondary | ICD-10-CM | POA: Diagnosis not present

## 2022-08-18 DIAGNOSIS — J9612 Chronic respiratory failure with hypercapnia: Secondary | ICD-10-CM | POA: Diagnosis not present

## 2022-08-18 DIAGNOSIS — J449 Chronic obstructive pulmonary disease, unspecified: Secondary | ICD-10-CM | POA: Diagnosis not present

## 2022-08-18 NOTE — Patient Instructions (Addendum)
Ceiling for prednisone is 10 mg daily and floor is 2.5 mg   Make sure you check your oxygen saturation  AT  your highest level of activity (not after you stop)   to be sure it stays over 90% and adjust  02 flow upward to maintain this level if needed but remember to turn it back to previous settings when you stop (to conserve your supply).   Please remember to go to the  x-ray department  for your tests - we will call you with the results when they are available    Please schedule a follow up visit in 3 months but call sooner if needed

## 2022-08-18 NOTE — Progress Notes (Signed)
Subjective:    Patient ID: [name], female   DOB: [birth year]    MRN: [id number]    Brief patient profile:  74  yobf quit smoking 10/2004 dx by transbronchial biopsy 5/92 with NCG  consistent with sarcoid. Since that time she's been off and on prednisone multiple  times with flares of coughing dyspnea and skin involvement > weaned off chronic prednisone Feb 2012 and placed back on 10/10/16 for deteriorating obstructive changes on pfts  With resp failure/ 02 dep        History of Present Illness  02/20/2017  f/u ov/Macrina Lehnert re:   GOLD III/ pred 10 mg daily and bevespi 2bid  and alb rare  Chief Complaint  Patient presents with   Follow-up    6wk rov. pt states breathing is baseline. pt reports of sob with exertion & occ prod cough with green mucus mainly in the morning   Doe better :  [facility] = can't walk a nl pace on a flat grade s sob but does fine slow and flat eg shopping on  2lpm / ok at rest s 02  Uses 02 2lpm sleeping  rec Drop prednisone to 10 mg one half daily       11/26/2017  f/u ov/Maggie Senseney re:  Sarcoid/skin involvement chronic cough ? Sinus dz/ using med calendar well / pednisone 10 mg daily  Chief Complaint  Patient presents with   Follow-up    Cough has improved some, but still producing some greenish yellow sputum first thing in the am.  She has not had to use her albuterol inhaler. She c/o shakiness in her hands for the past couple of months.    less cough p augmentin but still am sputum green / no noct symptoms Doing well with rehab but sometimes has to turn 02 up to 4 lpm there but doesn't do so in other settings and concerned about wt gain  rec Start plaquenil 200 mg daily  Ok to adjust the prednisone 73m  from 1-2 pills in am as per med calendar See calendar for specific medication instructions   11/22/2018  f/u ov/Tamakia Porto re:  Sarcooid/ copd/ 02 dep resp failure/ indolent onset x 3  weeks no appetite fatigue  Increased sob on pred 5 mg daily / no med calendar  Chief  Complaint  Patient presents with   Follow-up    Pt c/o increased SOB and cough the past few days. Her cough is prod at times with yellow to clear sputum. She states her mouth is very dry in the mornings. Her appetite is poor and she is down 9 lbs since the last visit. She is using her albuterol inhaler 2 x per wk on average.    Dyspnea:  mb and back flat ok on 3lpm Pulsed when walks  Cough: none Sleeping: on side bed is horizontal SABA use: couple of times a week 02: 2lpm and up to 3lpm walk rec Please remember to go to the lab department   for your tests - we will call you with the results when they are available. 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 She uses her albuterol inhaler about 1-2 x per week. She rarely uses neb. She has been having occ cough, esp in the am with thick, green sputum.   Dyspnea:  grocery shopping [employer] with sats  typically above 90% on 2 POC  Cough: green x one week, getting better with increase pred to 5 mg from 5-2.5-5  Sleeping: bed is flat, 2 pillows  SABA use: as above 02: 2lpm Covid status:   vax x 2, never infected  Mouth dry "feels like a rash"  Rec Zpak should clear the green mucus  When breathing / cough bad > prednisone  5 mg x 2 until better then work back to 2.5 mg daily  Make sure you check your oxygen saturation  at your highest level of  activity Get the new booster for covid 19    03/14/2022  f/u ov/Braxden Lovering re: sarcoidosis/ copd gold 4 02 dep  maint on stiolto 2 puffs each am   Chief Complaint  Patient presents with   Follow-up    ER in April, saw a nodule.  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 / not using [facility] parking  Cough: ok presently  Sleeping: flat bed 2 pillows  SABA use: once or twice a week  02: 2lpm hs ,  walking does 3lpm POC Rec We will be referring you to Dr Henrene Pastor re your ultraound findings Make sure you check your oxygen saturation  AT  your highest level of activity (not after you stop)   to be sure it stays over 90%  1st of June increase your prednisone to 20 mg daily until the CT scan mid  June  2023 and I'll call you the results - wean back to where you are are  Please schedule a follow up visit in 3 months but call sooner if needed    08/18/2022  f/u ov/Tahirah Sara re: sarcoidosis/copd  maint on stiolto /pred 10  per day  Chief Complaint  Patient presents with   Follow-up    Follow up for cirrhosis. Pt states that the Stiolto inhaler is working well for her with no issues notes. PRN Albuterol.    Dyspnea:  [employer] ok / not using  [employer] parking  Cough: min in am  Sleeping: flat bed 2 pillows  SABA use: one or twice  02: 2lpm hs/ 2-3 POC with walking  Covid status:   x 3      No obvious day to day or daytime variability or assoc excess/ purulent sputum or mucus plugs or hemoptysis or cp or chest tightness, subjective wheeze or overt sinus or hb symptoms.   Sleeping  without nocturnal  or early am exacerbation  of respiratory  c/o's or need for noct saba. Also denies any obvious fluctuation of symptoms with weather or environmental changes or other aggravating or alleviating factors except as outlined above   No unusual exposure hx or h/o childhood pna/ asthma or knowledge of premature birth.  Current Allergies, Complete Past Medical History, Past Surgical History, Family History, and  Social History were reviewed in [employer] record.  ROS  The following are not active complaints unless bolded Hoarseness, sore throat, dysphagia, dental problems, itching, sneezing,  nasal congestion or  discharge of excess mucus or purulent secretions, ear ache,   fever, chills, sweats, unintended wt loss or wt gain, classically pleuritic or exertional cp,  orthopnea pnd or arm/hand swelling  or leg swelling, presyncope, palpitations, abdominal pain, anorexia, nausea, vomiting, diarrhea  or change in bowel habits or change in bladder habits, change in stools or change in urine, dysuria, hematuria,  rash, arthralgias, visual complaints, headache, numbness, weakness or ataxia or problems with walking or coordination,  change in mood or  memory.        Current Meds  Medication Sig   albuterol (PROVENTIL) (2.5 MG/3ML) 0.083% nebulizer solution Take 3 mLs (2.5 mg total) by nebulization every 6 (six) hours as needed for wheezing or shortness of breath.   albuterol (VENTOLIN HFA) 108 (90 Base) MCG/ACT inhaler INHALE 2 PUFFS EVERY 6 HOURS AS NEEDED FOR WHEEZING OR SHORTNESS OF BREATH (Patient taking differently: Inhale 2 puffs into the lungs every 6 (six) hours as needed for shortness of breath or wheezing.)   anastrozole (ARIMIDEX) 1 MG tablet TAKE 1 TABLET BY MOUTH  DAILY   aspirin EC 81 MG tablet Take 1 tablet (81 mg total) by mouth daily. Start 02/23/19   atorvastatin (LIPITOR) 20 MG tablet TAKE 1 TABLET (20 MG TOTAL) BY MOUTH DAILY.   ferrous sulfate 325 (65 FE) MG tablet Take 1 tablet (325 mg total) by mouth daily with breakfast. (Patient taking differently: Take 325 mg by mouth every other day.)   fluticasone (FLONASE) 50 MCG/ACT nasal spray USE 2 SPRAYS IN BOTH  NOSTRILS DAILY AS NEEDED  FOR ALLERGIES   gabapentin (NEURONTIN) 300 MG capsule TAKE 1 CAPSULE BY MOUTH IN  THE MORNING AND 2 CAPSULES  IN THE EVENING (Patient taking differently: Take 300 mg by mouth 3 (three) times  daily. One cap in the morning 2 cap at night)   Guaifenesin 1200 MG TB12 Take 1,200 mg by mouth 2 (two) times daily as needed (chest congestion).   hydroxychloroquine (PLAQUENIL) 200 MG tablet TAKE 1 TABLET BY MOUTH  DAILY   meloxicam (MOBIC) 7.5 MG tablet Take 1 tablet (7.5 mg total) by mouth daily.   methocarbamol (ROBAXIN) 500 MG tablet Take 1 tablet (500 mg total) by mouth 2 (two) times daily.   metoprolol tartrate (LOPRESSOR) 25 MG tablet Take 1 tablet (25 mg total) by mouth 2 (two) times daily.   Multiple Vitamin (MULTIVITAMIN) capsule Take 1 capsule by mouth daily.   omeprazole (PRILOSEC) 20 MG capsule TAKE 1 CAPSULE EVERY DAY (Patient taking differently: Take 20 mg by mouth daily.)   OXYGEN 2lpm with rest and 3lpm with exertion   predniSONE (DELTASONE) 10 MG tablet 2 daily x 2 weeks   predniSONE (DELTASONE) 5 MG tablet 5 mg take 2 until better then gradually taper to one half daily   sertraline (ZOLOFT) 50 MG tablet TAKE 1 TABLET EVERY DAY (Patient taking differently: Take 50 mg by mouth daily.)   Tiotropium Bromide-Olodaterol (STIOLTO RESPIMAT) 2.5-2.5 MCG/ACT AERS Inhale 2 puffs into the lungs daily.   triamterene-hydrochlorothiazide (MAXZIDE-25) 37.5-25 MG tablet TAKE 1 TABLET EVERY DAY (Patient taking differently: Take 1 tablet by mouth daily. 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 Unice Cobble  -h/o Daily prednisone since 2007   >  off completely Feb 2012 with no problem> restarted  09/2016  ? of SUPERFICIAL VEIN THROMBOSIS (ICD-453.9)   ENDOMETRIAL POLYP (ICD-621.0)   UTERINE FIBROID (ICD-218.9)  RHINITIS, ALLERGIC (ICD-477.9)  HYPERTENSION, BENIGN SYSTEMIC (ICD-401.1)  COPD (ICD-496)  - PFT's 06/26/08 FEV1 1.09 ratio  - PFTs 03/02/10 FEV1 1.06 ratio 34  - PFT's  08/01/2011 FEV1 1.6 (42%)  41 ratio  DLCO 55 corrects 77 - HFA  50% 06/05/2011  > 75% 06/29/2011  - Spiriva trial March 02, 2010 > ? Some better but no worse off 04/2011 - Rehab started mid aug 2018  BACK PAIN, LOW (ICD-724.2)  ANEMIA, IRON DEFICIENCY, UNSPEC. (ICD-280.9)  Health Maintenance.......................................................   [facility]   Family History:  crohn`s in daughter, M-lupus, htn, asthma,  no Ca, DM, CAD    Social History:   lives with twin children and grandson. single; >20 pack year history, quit in 2005; no EtOH  Laid off  from works for [employer] Sept 2013           Objective:   Physical Exam  Wts  08/18/2022   187  03/14/2022     199  07/28/2021    205  08/26/2020  219  11/17/2019      223   12/30/2018  188  wt 166 Oct 12, 2009 >170 October 28, 2010 > 169 06/29/2011 >>171 08/24/2011 > 01/15/2012  171 > 04/24/2012 176 > 06/28/2012  175> 08/13/2012 > 08/30/2012  173 > 170 11/28/2012 > 01/29/2013  176 > 174 >173 06/16/2013 > 171 09/18/2013 > 12/19/2013 177 >179 04/13/2014>  04/23/2014 177 > 08/21/2014 184 >182 11/27/2014 > 05/12/2015 177  > 05/11/2016   177 > 10/27/2016  175 > 11/09/2016 182 > 01/09/2017 198 > 02/20/2017  206 >  07/17/2017   209 >  09/03/2017   210 > 09/28/2017  206  > 11/26/2017   209 > 01/10/2018  210> 02/19/2018  206> 05/21/2018  198 > 08/06/2018  197 > 08/21/2018  196 > 11/22/2018  187    Vital signs reviewed  08/18/2022  - Note at rest 02 sats  95% on 2lpm pulsed   General appearance:    amb bf wearing sketcher sneakers      HEENT :  Oropharynx  clear /edentulous Nasal turbinates nl   NECK :  without JVD/Nodes/TM/ nl carotid upstrokes bilaterally   LUNGS: no acc muscle use,  Mod barrel  contour chest wall with bilateral  Distant bs s audible wheeze and  without cough on insp or exp maneuvers and mod  Hyperresonant  to  percussion bilaterally     CV:  RRR  no s3 or murmur or increase in P2, and no edema   ABD:  soft and nontender with  pos mid insp Hoover's  in the supine position. No bruits or organomegaly appreciated, bowel sounds nl  MS:   Ext warm without deformities or   obvious joint restrictions , calf tenderness, cyanosis or clubbing  SKIN: warm and dry without lesions    NEURO:  alert, approp, nl sensorium with  no motor or cerebellar deficits apparent.             CXR PA and Lateral:   08/18/2022 :    I personally reviewed images and impression is as follows:     Improved aeration vs priors      Assessment:

## 2022-08-19 ENCOUNTER — Encounter: Payer: Self-pay | Admitting: Internal Medicine

## 2022-08-19 NOTE — Assessment & Plan Note (Signed)
New start 09/2016 at admit see records - 10/18/16 Patient Saturations on Room Air at Rest = 92% Patient Saturations on Room Air while Ambulating = 84% Patient Saturations on 2 Liters of oxygen while Ambulating = 90% - HCO3  11/09/2016 = 39  - HCO3   03/16/17        = 34  - HC03    01/10/2018  = 40  - HC03  11/22/2018     = 38  - HC03  12/20/2018       = 26  - HC03  11/17/2019       = 37  11/17/2019 Patient Saturations on Room Air at Rest = 97% Patient Saturations on Room Air while Ambulating = 79% Patient Saturations on 2 Liters of oxygen while Ambulating = 90% - 08/18/2022   Walked on 2lpm Pulsed  x  one  lap(s) =  approx 250  ft  @ mod pace, stopped due to hip pain  with lowest 02 sats 89% improved on 3lpm pulsed   As of 08/18/2022 rec 2lpm sleep/rest and at least 3lpm pulsed walking with further titration if needed with goal > 90%             Each maintenance medication was reviewed in detail including emphasizing most importantly the difference between maintenance and prns and under what circumstances the prns are to be triggered using an action plan format where appropriate.  Total time for H and P, chart review, counseling, reviewing smi/ 02/hfa/ neb device(s) , directly observing portions of ambulatory 02 saturation study/ and generating customized AVS unique to this office visit / same day charting = 26 min

## 2022-08-19 NOTE — Assessment & Plan Note (Signed)
Quit smoking 2005  - PFT's 06/26/08 FEV1 1.09 ratio  - PFTs 03/02/10 FEV1 1.06 ratio 34    PFT's 08/01/2011 FEV1 1.6 (42%)  41 ratio  DLCO 55 corrects 77 - Spiriva trial March 02, 2010 > ? better but not worse off it 04/2011 - 05/12/2015 p extensive coaching HFA effectiveness =    90%  - Referred to Rehab 08/21/2014 > could not arrange due to schedule  -med calendar 06/16/2013 > did not bring to office as requested 12/19/13 or 04/23/14  Or 08/21/2014  - 11/09/2016    changed symb to bevespi since taking chronic pred for pf    - 02/20/2017 decreased pred to 5 mg daily > weaned to 2.5 mg qd as of 07/17/2017  - started rehab mid Aug 2018  - 07/17/2017 changed to pred 2.5 even days > did not tolerate so changed  Around  07/28/17 back to 5 mg qod  - Spirometry 09/03/2017  FEV1 0.52 (23%)  Ratio 40 with typical curvature   - PFT's  02/19/2018  FEV1 0.65 (29 % ) ratio 43  p 1 % improvement from saba p stiolto/ pred 5 mg  prior to study with DLCO  39 % corrects to 56  % for alv volume - 12/30/2018  After extensive coaching inhaler device,  effectiveness =    95%   With smi > continue stiolto    Group D (now reclassified as E) in terms of symptom/risk and laba/lama/ICS  therefore appropriate rx at this point >>>  stiolto plus systemic steroids (being used for sarcoid anyway) appears to be effective here > continue.

## 2022-08-19 NOTE — Assessment & Plan Note (Signed)
Positive transbronchial biopsy 02/19/91 by Dr. Unice Cobble  -Daily prednisone since 2007 with ? adrenal insufficiency iatrogenic > off completely Feb 2012 with no recurrence - PFT's 06/26/08 FEV1  1.09 ratio  - PFTs  03/02/10 FEV1  1.06 (42%) ratio 34 and no better p B2,  DLC0 48% corrects to 64% - PFT's 08/01/2011 FEV1 1.6 (42%)  41 ratio  DLCO 55 corrects 77 - PFTs 10/10/16    FEV1  0.87(38)ratio 41 and DLCO 36/39c corrects to 51 for alv vol > placed back on daily pred  - 02/20/2017 decreased pred to 5 mg daily > to 2.47m daily as of 07/17/2017  - 07/17/2017 try 2.5 mg qod > 09/28/2017 changed to 20 mg until better and new floor of 5 mg daily plus consider plaquenil due to new skin involvement on face  - opth eval neg 11/10/17 - Start plaquenil 200 mg daily 11/26/2017  - 01/10/2018 ESR up to 97 with pred taper so rec bid plaquenil > could only tol 200 mg daily  - 02/19/2018 ESR = 94 on 570mdaily  - 05/21/2018 referred to dermatology  - 08/21/2018   ESR = 80  On prednisone 10 mg/ plaquenil 200 mg daily > tapered to floor of 5 mg daily  - 11/22/2018  ESR  = 113 on pred 5 mg and plaq 200 mg with calcium 12.1 so rec pred 20 mg ub then new floor of 10 mg daily  - 02/24/2019 clinically improved and doing fine on Plaquenil 200 plus pred  10 mg daily so rec rechalleng with slow taper to 5 mg daily   - 07/28/21   ACE  Level  38 (down from 45 x 4 y prior )  The goal with a chronic steroid dependent illness is always arriving at the lowest effective dose that controls the disease/symptoms and not accepting a set "formula" which is based on statistics or guidelines that don't always take into account patient  variability or the natural hx of the dz in every individual patient, which may well vary over time.  For now therefore I recommend the patient maintain ceiling of 10 mg and floor of 2.5 mg daily guided by doe/ cough symptom control

## 2022-09-07 ENCOUNTER — Other Ambulatory Visit: Payer: Self-pay | Admitting: Hematology and Oncology

## 2022-09-07 DIAGNOSIS — Z170 Estrogen receptor positive status [ER+]: Secondary | ICD-10-CM

## 2022-09-11 ENCOUNTER — Telehealth: Payer: Self-pay | Admitting: Internal Medicine

## 2022-09-11 MED ORDER — PREDNISONE 10 MG PO TABS: ORAL_TABLET | ORAL | 2 refills | Status: DC

## 2022-09-11 MED ORDER — PREDNISONE 5 MG PO TABS: ORAL_TABLET | ORAL | 2 refills | Status: DC

## 2022-09-11 NOTE — Telephone Encounter (Signed)
Increase prednisone to 10 mg x 2 until better then work back to previous dose of 2.5 mg daily   Give #160 of the pred 10 and # 60 of the pred 5 mg with 2 refills

## 2022-09-11 NOTE — Telephone Encounter (Signed)
Called and spoke with pt who states she started coughing 2 days ago and is getting up green phlegm. Pt also states that she is wheezing. Pt denies any complaints of fever.  States that she used her nebulizer once yesterday 10/29 and has used it once today 10/30.  Pt states that she does have some prednisone at home (93m and 150m but states that she is running low on both meds.  Pt wants to know what we can recommend to help with her symptoms. Dr. WeMelvyn Novasplease advise.    Assessment & Plan Note by WeTanda RockersMD at 08/19/2022 7:15 AM  Author: WeTanda RockersMD Author Type: Physician Filed: 08/19/2022  7:15 AM  Note Status: Written Cosign: Cosign Not Required Encounter Date: 08/18/2022  [facility] Editor: WeTanda RockersMD (Physician)               Positive transbronchial biopsy 02/19/91 by Dr. WiUnice Cobble-Daily prednisone since 2007 with ? adrenal insufficiency iatrogenic > off completely Feb 2012 with no recurrence - PFT's 06/26/08 FEV1  1.09 ratio  - PFTs  03/02/10 FEV1  1.06 (42%) ratio 34 and no better p B2,  DLC0 48% corrects to 64% - PFT's 08/01/2011 FEV1 1.6 (42%)  41 ratio  DLCO 55 corrects 77 - PFTs 10/10/16    FEV1  0.87(38)ratio 41 and DLCO 36/39c corrects to 51 for alv vol > placed back on daily pred  - 02/20/2017 decreased pred to 5 mg daily > to 2.9m94maily as of 07/17/2017  - 07/17/2017 try 2.5 mg qod > 09/28/2017 changed to 20 mg until better and new floor of 5 mg daily plus consider plaquenil due to new skin involvement on face  - opth eval neg 11/10/17 - Start plaquenil 200 mg daily 11/26/2017  - 01/10/2018 ESR up to 97 with pred taper so rec bid plaquenil > could only tol 200 mg daily  - 02/19/2018 ESR = 94 on 9mg23mily  - 05/21/2018 referred to dermatology  - 08/21/2018   ESR = 80  On prednisone 10 mg/ plaquenil 200 mg daily > tapered to floor of 5 mg daily  - 11/22/2018  ESR  = 113 on pred 5 mg and plaq 200 mg with calcium 12.1 so rec pred 20 mg ub then  new floor of 10 mg daily  - 02/24/2019 clinically improved and doing fine on Plaquenil 200 plus pred  10 mg daily so rec rechalleng with slow taper to 5 mg daily   - 07/28/21   ACE  Level  38 (down from 45 x 4 y prior )   The goal with a chronic steroid dependent illness is always arriving at the lowest effective dose that controls the disease/symptoms and not accepting a set "formula" which is based on statistics or guidelines that don't always take into account patient  variability or the natural hx of the dz in every individual patient, which may well vary over time.  For now therefore I recommend the patient maintain ceiling of 10 mg and floor of 2.5 mg daily guided by doe/ cough symptom control

## 2022-09-13 ENCOUNTER — Emergency Department (HOSPITAL_COMMUNITY)
Admission: EM | Admit: 2022-09-13 | Discharge: 2022-09-13 | Disposition: A | Discharge status: Home / Self Care | Payer: Medicare Other | Attending: Emergency Medicine | Admitting: Emergency Medicine

## 2022-09-13 ENCOUNTER — Other Ambulatory Visit: Payer: Self-pay

## 2022-09-13 ENCOUNTER — Encounter (HOSPITAL_COMMUNITY): Payer: Self-pay

## 2022-09-13 ENCOUNTER — Emergency Department (HOSPITAL_COMMUNITY): Payer: Medicare Other

## 2022-09-13 DIAGNOSIS — E876 Hypokalemia: Secondary | ICD-10-CM | POA: Diagnosis not present

## 2022-09-13 DIAGNOSIS — Z1152 Encounter for screening for COVID-19: Secondary | ICD-10-CM | POA: Insufficient documentation

## 2022-09-13 DIAGNOSIS — I10 Essential (primary) hypertension: Secondary | ICD-10-CM | POA: Insufficient documentation

## 2022-09-13 DIAGNOSIS — Z7951 Long term (current) use of inhaled steroids: Secondary | ICD-10-CM | POA: Diagnosis not present

## 2022-09-13 DIAGNOSIS — Z7982 Long term (current) use of aspirin: Secondary | ICD-10-CM | POA: Diagnosis not present

## 2022-09-13 DIAGNOSIS — Z79899 Other long term (current) drug therapy: Secondary | ICD-10-CM | POA: Insufficient documentation

## 2022-09-13 DIAGNOSIS — J449 Chronic obstructive pulmonary disease, unspecified: Secondary | ICD-10-CM | POA: Diagnosis not present

## 2022-09-13 DIAGNOSIS — R0602 Shortness of breath: Secondary | ICD-10-CM | POA: Diagnosis present

## 2022-09-13 DIAGNOSIS — D649 Anemia, unspecified: Secondary | ICD-10-CM | POA: Diagnosis not present

## 2022-09-13 LAB — CBC
HCT: 26.2 % — ABNORMAL LOW (ref 36.0–46.0)
Hemoglobin: 8.3 g/dL — ABNORMAL LOW (ref 12.0–15.0)
MCH: 25.9 pg — ABNORMAL LOW (ref 26.0–34.0)
MCHC: 31.7 g/dL (ref 30.0–36.0)
MCV: 81.6 fL (ref 80.0–100.0)
Platelets: 239 10*3/uL (ref 150–400)
RBC: 3.21 MIL/uL — ABNORMAL LOW (ref 3.87–5.11)
RDW: 14.6 % (ref 11.5–15.5)
WBC: 8.2 10*3/uL (ref 4.0–10.5)
nRBC: 0 % (ref 0.0–0.2)

## 2022-09-13 LAB — BASIC METABOLIC PANEL
Anion gap: 7 (ref 5–15)
BUN: 9 mg/dL (ref 8–23)
CO2: 30 mmol/L (ref 22–32)
Calcium: 7.8 mg/dL — ABNORMAL LOW (ref 8.9–10.3)
Chloride: 107 mmol/L (ref 98–111)
Creatinine, Ser: 0.83 mg/dL (ref 0.44–1.00)
GFR, Estimated: >60 mL/min (ref >60)
Glucose, Bld: 94 mg/dL (ref 70–99)
Potassium: 3 mmol/L — ABNORMAL LOW (ref 3.5–5.1)
Sodium: 144 mmol/L (ref 135–145)

## 2022-09-13 LAB — TROPONIN I (HIGH SENSITIVITY)
Troponin I (High Sensitivity): 5 ng/L (ref <18)
Troponin I (High Sensitivity): 7 ng/L (ref <18)

## 2022-09-13 LAB — RESP PANEL BY RT-PCR (FLU A&B, COVID) ARPGX2
Influenza A by PCR: NEGATIVE
Influenza B by PCR: NEGATIVE
SARS Coronavirus 2 by RT PCR: NEGATIVE

## 2022-09-13 MED ORDER — POTASSIUM CHLORIDE CRYS ER 20 MEQ PO TBCR
40.0000 meq | EXTENDED_RELEASE_TABLET | Freq: Once | ORAL | Status: AC
  Administered 2022-09-13: 40 meq via ORAL
  Filled 2022-09-13: qty 2

## 2022-09-13 MED ORDER — METHYLPREDNISOLONE SODIUM SUCC 125 MG IJ SOLR
80.0000 mg | Freq: Once | INTRAMUSCULAR | Status: AC
  Administered 2022-09-13: 80 mg via INTRAVENOUS
  Filled 2022-09-13: qty 2

## 2022-09-13 NOTE — Discharge Instructions (Addendum)
Follow up with Dr. Melvyn Novas for recheck.  Take prednisone as previously directed

## 2022-09-13 NOTE — ED Provider Notes (Signed)
[facility] Provider Note   CSN: [id number] Arrival date & time: 09/13/22  1253     History  Chief Complaint  Patient presents with   Chest Pain    [name] is a 61 y.o. female with past medical history significant for COPD, sarcoidosis, hypertension, acid reflux, hyperlipidemia, depression, cirrhosis, anemia who presents with concern for shortness of breath, chest pain with coughing, productive cough for the last 5 days.  On arrival EMS reports expiratory wheezing, she had 23 respirations a minute, they gave nebulizers on route, she was on she has been 100% on room air, she is on home oxygen secondary to her chronic sarcoidosis.  After ambulating treatment her respirations decreased to 18, she reports improvement of her condition.  Chest Pain Associated symptoms: cough and shortness of breath        Home Medications Prior to Admission medications   Medication Sig Start Date End Date Taking? Authorizing Provider  acetaminophen (TYLENOL) 325 MG tablet Take 650 mg by mouth every 6 (six) hours as needed for mild pain or moderate pain.   Yes [provider]  albuterol (PROVENTIL) (2.5 MG/3ML) 0.083% nebulizer solution Take 3 mLs (2.5 mg total) by nebulization every 6 (six) hours as needed for wheezing or shortness of breath. 12/22/18  Yes Bonnielee Haff, MD  albuterol (VENTOLIN HFA) 108 (90 Base) MCG/ACT inhaler INHALE 2 PUFFS EVERY 6 HOURS AS NEEDED FOR WHEEZING OR SHORTNESS OF BREATH Patient taking differently: Inhale 2 puffs into the lungs every 6 (six) hours as needed for shortness of breath or wheezing. 09/23/19  Yes Tanda Rockers, MD  anastrozole (ARIMIDEX) 1 MG tablet TAKE 1 TABLET BY MOUTH  DAILY Patient taking differently: Take 1 mg by mouth daily. 03/02/22  Yes Nicholas Lose, MD  aspirin EC 81 MG tablet Take 1 tablet (81 mg total) by mouth daily. Start 02/23/19 02/21/19  Yes Pokhrel, Laxman, MD  atorvastatin (LIPITOR) 20 MG  tablet TAKE 1 TABLET (20 MG TOTAL) BY MOUTH DAILY. 03/28/21  Yes Benay Pike, MD  ferrous sulfate 325 (65 FE) MG tablet Take 1 tablet (325 mg total) by mouth daily with breakfast. Patient taking differently: Take 325 mg by mouth every other day. 02/21/19  Yes Pokhrel, Laxman, MD  fluticasone (FLONASE) 50 MCG/ACT nasal spray USE 2 SPRAYS IN BOTH  NOSTRILS DAILY AS NEEDED  FOR ALLERGIES Patient taking differently: Place 2 sprays into both nostrils daily as needed for allergies. 08/15/21  Yes Tanda Rockers, MD  gabapentin (NEURONTIN) 300 MG capsule TAKE 1 CAPSULE BY MOUTH IN THE  MORNING AND 2 CAPSULES BY MOUTH  IN THE EVENING Patient taking differently: Take 300 mg by mouth See admin instructions. Take one capsule (300 mg) by mouth in the morning and two capsules (600 mg) by mouth in the evening 09/08/22  Yes Nicholas Lose, MD  hydroxychloroquine (PLAQUENIL) 200 MG tablet TAKE 1 TABLET BY MOUTH  DAILY 06/05/22  Yes Tanda Rockers, MD  metoprolol tartrate (LOPRESSOR) 25 MG tablet Take 1 tablet (25 mg total) by mouth 2 (two) times daily. Patient taking differently: Take 25 mg by mouth daily. 03/09/21  Yes Benay Pike, MD  montelukast (SINGULAIR) 10 MG tablet Take 10 mg by mouth at bedtime. 07/31/22  Yes [provider]  Multiple Vitamin (MULTIVITAMIN) capsule Take 1 capsule by mouth daily.   Yes [provider]  omeprazole (PRILOSEC) 20 MG capsule TAKE 1 CAPSULE EVERY DAY Patient taking differently: Take 20  mg by mouth daily. 01/03/21  Yes Benay Pike, MD  predniSONE (DELTASONE) 10 MG tablet 2 daily x 2 weeks 03/14/22  Yes Tanda Rockers, MD  predniSONE (DELTASONE) 10 MG tablet Increase prednisone to 10 mg x 2 until better then work back to previous dose of 2.5 mg daily 09/11/22  Yes Tanda Rockers, MD  predniSONE (DELTASONE) 5 MG tablet 5 mg take 2 until better then gradually taper to one half daily 10/19/21  Yes Tanda Rockers, MD  predniSONE (DELTASONE) 5 MG tablet After  completing 10 mg x2 until better, work back to previous dose of 2.5 mg daily 09/11/22  Yes Tanda Rockers, MD  sertraline (ZOLOFT) 50 MG tablet TAKE 1 TABLET EVERY DAY Patient taking differently: Take 50 mg by mouth daily. 09/11/20  Yes Benay Pike, MD  Tiotropium Bromide-Olodaterol (STIOLTO RESPIMAT) 2.5-2.5 MCG/ACT AERS Inhale 2 puffs into the lungs daily. 11/16/21  Yes Tanda Rockers, MD  triamterene-hydrochlorothiazide (DEYCXKG-81) 37.5-25 MG tablet TAKE 1 TABLET EVERY DAY Patient taking differently: Take 1 tablet by mouth daily. TAKE 1 TABLET EVERY DAY 06/02/21  Yes Tanda Rockers, MD  OXYGEN 2lpm with rest and 3lpm with exertion    [provider]      Allergies    Codeine    Review of Systems   Review of Systems  Respiratory:  Positive for cough and shortness of breath.   Cardiovascular:  Positive for chest pain.  All other systems reviewed and are negative.   Physical Exam Updated Vital Signs BP 138/64   Pulse 63   Temp 98 F (36.7 C) (Oral)   Resp 18   SpO2 99%  Physical Exam Vitals and nursing note reviewed.  Constitutional:      General: She is not in acute distress.    Appearance: Normal appearance. She is ill-appearing.     Comments: Chronically ill-appearing but in no acute distress  HENT:     Head: Normocephalic and atraumatic.  Eyes:     General:        Right eye: No discharge.        Left eye: No discharge.  Cardiovascular:     Rate and Rhythm: Normal rate and regular rhythm.     Heart sounds: No murmur heard.    No friction rub. No gallop.  Pulmonary:     Effort: Pulmonary effort is normal.     Breath sounds: Normal breath sounds.     Comments: Patient with some scattered crackles, biphasic end expiratory wheezing, no significant respiratory distress, tachypnea, focal consolidation noted. Abdominal:     General: Bowel sounds are normal.     Palpations: Abdomen is soft.  Skin:    General: Skin is warm and dry.     Capillary Refill:  Capillary refill takes less than 2 seconds.  Neurological:     Mental Status: She is alert and oriented to person, place, and time.  Psychiatric:        Mood and Affect: Mood normal.        Behavior: Behavior normal.     ED Results / Procedures / Treatments   Labs (all labs ordered are listed, but only abnormal results are displayed) Labs Reviewed  CBC - Abnormal; Notable for the following components:      Result Value   RBC 3.21 (*)    Hemoglobin 8.3 (*)    HCT 26.2 (*)    MCH 25.9 (*)    All other components  within normal limits  BASIC METABOLIC PANEL - Abnormal; Notable for the following components:   Potassium 3.0 (*)    Calcium 7.8 (*)    All other components within normal limits  RESP PANEL BY RT-PCR (FLU A&B, COVID) ARPGX2  TROPONIN I (HIGH SENSITIVITY)  TROPONIN I (HIGH SENSITIVITY)    EKG EKG Interpretation  Date/Time:  Wednesday September 13 2022 14:10:41 EDT Ventricular Rate:  67 PR Interval:  184 QRS Duration: 115 QT Interval:  425 QTC Calculation: 449 R Axis:   72 Text Interpretation: Sinus rhythm Left atrial enlargement Artifact in lead(s) I II III aVR aVL aVF V1 V2 V3 Confirmed by Godfrey Pick ([id number]) on 09/13/2022 4:13:10 PM  Radiology DG Chest 2 View  Result Date: 09/13/2022 CLINICAL DATA:  Shortness of breath, COPD, former smoker EXAM: CHEST - 2 VIEW COMPARISON:  08/18/2022 FINDINGS: Stable heart size and vascularity. Background hyperinflation and parenchymal scarring compatible with known COPD/emphysema. Right mid lung nodular opacity/scarring persist. No large effusion or pneumothorax. Trachea midline. Monitor leads overlie the chest. Right axillary surgical clips noted. IMPRESSION: 1. Stable COPD/emphysema and right mid lung scarring. 2. No interval change or superimposed acute process by plain radiography. Electronically Signed   By: Jerilynn Mages.  Shick M.D.   On: 09/13/2022 15:55    Procedures Procedures    Medications Ordered in ED Medications  potassium  chloride SA (KLOR-CON M) CR tablet 40 mEq (40 mEq Oral Given 09/13/22 1733)  methylPREDNISolone sodium succinate (SOLU-MEDROL) 125 mg/2 mL injection 80 mg (80 mg Intravenous Given 09/13/22 1924)    ED Course/ Medical Decision Making/ A&P                           Medical Decision Making Amount and/or Complexity of Data Reviewed Labs: ordered. Radiology: ordered.  Risk Prescription drug management.   This patient is a 61 y.o. female who presents to the ED for concern of shob, this involves an extensive number of treatment options, and is a complaint that carries with it a high risk of complications and morbidity. The emergent differential diagnosis prior to evaluation includes, but is not limited to,  asthma exacerbation, COPD exacerbation, acute upper respiratory infection, acute bronchitis, chronic bronchitis, interstitial lung disease, ARDS, PE, pneumonia, atypical ACS, carbon monoxide poisoning, spontaneous pneumothorax versus other .   This is not an exhaustive differential.   Past Medical History / Co-morbidities / Social History: Sarcoidosis, COPD Gold 4, chronic oxygen use with ambulation, hypertension, acid reflux  Additional history: Chart reviewed. Pertinent results include: Reviewed lab work, imaging from recent previous emergency department visits, outpatient pulmonology visits  Physical Exam: Physical exam performed. The pertinent findings include: On exam patient with scattered crackles, biphasic and expiratory wheezing, no significant respiratory distress, or tachypnea, no focal consolidation noted  Lab Tests: I ordered, and personally interpreted labs.  The pertinent results include: Her BMP is notable for a hypokalemia, potassium 3.0, we will orally replete, she also has a moderate hypocalcemia with calcium 7.8, is abnormal compared to her baseline, recommend PCP follow-up.  CBC is notable for anemia, hemoglobin 8.3, however patient is chronically anemic in the 8-9 range,  denies any recent dark tarry stool, or other blood loss, likely anemia of chronic disease   Cardiac Monitoring:  The patient was maintained on a cardiac monitor.  My attending physician Dr. Maylon Peppers viewed and interpreted the cardiac monitored which showed an underlying rhythm of: NSR. I agree with this interpretation.   Medications:  I ordered medication including potassium for hypokalemia, for Solu-Medrol for difficulty breathing. Reevaluation of the patient after these medicines showed that the patient improved.   9:59 AM Care of [name] transferred to [facility] and Dr. Doren Custard at the end of my shift as the patient will require reassessment once labs/imaging have resulted. Patient presentation, ED course, and plan of care discussed with review of all pertinent labs and imaging. Please see his/her note for further details regarding further ED course and disposition. Plan at time of handoff is patient is pending chest x-ray, and reevaluation of her respiratory status, based on her initial clinical assessment I think that she is likely safe to discharge with close PCP/pulmonology follow-up, but will require reevaluation and full assessment once all clinical data is returned. This may be altered or completely changed at the discretion of the oncoming team pending results of further workup.   I discussed this case with my attending physician Dr. Maylon Peppers who cosigned this note including patient's presenting symptoms, physical exam, and planned diagnostics and interventions. Attending physician stated agreement with plan or made changes to plan which were implemented.    Final Clinical Impression(s) / ED Diagnoses Final diagnoses:  Shortness of breath    Rx / DC Orders ED Discharge Orders     None         Anselmo Pickler, PA-C 09/15/22 [id number]    Kemper Durie, DO 09/15/22 1101

## 2022-09-13 NOTE — ED Triage Notes (Signed)
Pt BIB [facility] from home d/t CP, does have Hx of COPD & productive cough the last 5 days. EMS reports expiratory wheezing & 23 Resp before the nebulizers they gave while en route, 12 L WNL, 100% on RA entire time. After neb Tx resp decreased to 18, her breathing "felt better" (per pt), A/Ox4, 22g PIV in Lt hand, 136/70.

## 2022-09-13 NOTE — ED Notes (Signed)
Patient transported to X-ray 

## 2022-09-13 NOTE — ED Provider Notes (Signed)
Patient's care assumed at 3:30 PM.  Patient came to the emergency department complaining of some discomfort in her chest and some shortness of breath.  Patient is on home O2 2 L.  Patient reports that her pulmonary doctor has had her increase her home prednisone. Laboratory evaluations returned patient's hemoglobin is 8.3 this is at her baseline potassium is 3.0 patient's COVID and influenza test are negative.  Chest x-ray shows no sign of infection no acute change.  Patient reexamined patient is satting 95% on 2 L.  Patient is able to ambulate to the restroom without distress.  Patient given Solu-Medrol 80 mg IV.  Patient is advised to call Dr. Melvyn Novas to schedule for follow-up.    Sidney Ace 09/13/22 [id number]    Godfrey Pick, MD 09/14/22 [phone]

## 2022-09-28 ENCOUNTER — Telehealth: Payer: Self-pay | Admitting: Hematology and Oncology

## 2022-09-28 NOTE — Telephone Encounter (Signed)
Scheduled appointment per 10/27 staff message. Patient is aware.

## 2022-10-24 ENCOUNTER — Inpatient Hospital Stay: Payer: Medicare Other | Attending: Hematology and Oncology | Admitting: Hematology and Oncology

## 2022-10-24 NOTE — Progress Notes (Incomplete)
Patient Care Team: Sonia Side., FNP as PCP - General (Family Medicine) Nicholas Lose, MD as Consulting Physician (Hematology and Oncology) Eppie Gibson, MD as Attending Physician (Radiation Oncology) Stark Klein, MD as Consulting Physician (General Surgery)  DIAGNOSIS:  Encounter Diagnosis  Name Primary?   Malignant neoplasm of upper-outer quadrant of right breast in female, estrogen receptor positive ([place]) Yes    SUMMARY OF ONCOLOGIC HISTORY: Oncology History  Breast cancer of upper-outer quadrant of right female breast ([place])  07/08/2015 Mammogram   Right breast calcifications 11 mm span   08/04/2015 Initial Diagnosis   Biopsy right breast: Invasive ductal carcinoma with DCIS with calcifications, lymphovascular invasion is identified, grade 2-3, ER 100%, PR 100% Ki-67 20%, HER-2 negative ratio 1.46   08/04/2015 Clinical Stage   Stage IA: T1b N0   08/18/2015 Definitive Surgery   Right  lumpectomy: invasive ductal carcinoma, grade 2, 0.6 cm, with DCIS high-grade, margins negative, 0/2 lymph nodes negative, ER 100%, PR positive, HER-2 negative, Ki-67 20%   08/18/2015 Pathologic Stage   Stage IA: T1b N0   08/18/2015 Oncotype testing   Insufficient tissue to perform   09/21/2015 - 10/20/2015 Radiation Therapy   Adjuvant radiation therapy: 1) Right Breast / 40.05 Gy in 15 fractions 2) Right breast boost / 10 Gy in 5 fractions   10/08/2015 - 10/11/2015 Hospital Admission   cellulitis of the right breast   11/16/2015 -  Anti-estrogen oral therapy   Anastrozole 1 mg daily once hot flashes better controlled. Planned duration of therapy 5 years.      CHIEF COMPLIANT: Follow-up of right breast cancer on anastrozole     INTERVAL HISTORY: [name] is a 61 y.o. with above-mentioned history of right breast cancer treated with lumpectomy, radiation, and who is currently on anastrozole therapy. She presents to the clinic for a follow-up.    ALLERGIES:  is allergic to  codeine.  MEDICATIONS:  Current Outpatient Medications  Medication Sig Dispense Refill   acetaminophen (TYLENOL) 325 MG tablet Take 650 mg by mouth every 6 (six) hours as needed for mild pain or moderate pain.     albuterol (PROVENTIL) (2.5 MG/3ML) 0.083% nebulizer solution Take 3 mLs (2.5 mg total) by nebulization every 6 (six) hours as needed for wheezing or shortness of breath. 75 mL 1   albuterol (VENTOLIN HFA) 108 (90 Base) MCG/ACT inhaler INHALE 2 PUFFS EVERY 6 HOURS AS NEEDED FOR WHEEZING OR SHORTNESS OF BREATH (Patient taking differently: Inhale 2 puffs into the lungs every 6 (six) hours as needed for shortness of breath or wheezing.) 54 g 0   anastrozole (ARIMIDEX) 1 MG tablet TAKE 1 TABLET BY MOUTH  DAILY (Patient taking differently: Take 1 mg by mouth daily.) 100 tablet 2   aspirin EC 81 MG tablet Take 1 tablet (81 mg total) by mouth daily. Start 02/23/19     atorvastatin (LIPITOR) 20 MG tablet TAKE 1 TABLET (20 MG TOTAL) BY MOUTH DAILY. 90 tablet 3   ferrous sulfate 325 (65 FE) MG tablet Take 1 tablet (325 mg total) by mouth daily with breakfast. (Patient taking differently: Take 325 mg by mouth every other day.) 90 tablet 0   fluticasone (FLONASE) 50 MCG/ACT nasal spray USE 2 SPRAYS IN BOTH  NOSTRILS DAILY AS NEEDED  FOR ALLERGIES (Patient taking differently: Place 2 sprays into both nostrils daily as needed for allergies.) 48 g 11   gabapentin (NEURONTIN) 300 MG capsule TAKE 1 CAPSULE BY MOUTH IN THE  MORNING AND  2 CAPSULES BY MOUTH  IN THE EVENING (Patient taking differently: Take 300 mg by mouth See admin instructions. Take one capsule (300 mg) by mouth in the morning and two capsules (600 mg) by mouth in the evening) 300 capsule 0   hydroxychloroquine (PLAQUENIL) 200 MG tablet TAKE 1 TABLET BY MOUTH  DAILY 90 tablet 3   metoprolol tartrate (LOPRESSOR) 25 MG tablet Take 1 tablet (25 mg total) by mouth 2 (two) times daily. (Patient taking differently: Take 25 mg by mouth daily.) 180  tablet 1   montelukast (SINGULAIR) 10 MG tablet Take 10 mg by mouth at bedtime.     Multiple Vitamin (MULTIVITAMIN) capsule Take 1 capsule by mouth daily.     omeprazole (PRILOSEC) 20 MG capsule TAKE 1 CAPSULE EVERY DAY (Patient taking differently: Take 20 mg by mouth daily.) 90 capsule 1   OXYGEN 2lpm with rest and 3lpm with exertion     predniSONE (DELTASONE) 10 MG tablet 2 daily x 2 weeks 60 tablet 0   predniSONE (DELTASONE) 10 MG tablet Increase prednisone to 10 mg x 2 until better then work back to previous dose of 2.5 mg daily 160 tablet 2   predniSONE (DELTASONE) 5 MG tablet 5 mg take 2 until better then gradually taper to one half daily 100 tablet 3   predniSONE (DELTASONE) 5 MG tablet After completing 10 mg x2 until better, work back to previous dose of 2.5 mg daily 60 tablet 2   sertraline (ZOLOFT) 50 MG tablet TAKE 1 TABLET EVERY DAY (Patient taking differently: Take 50 mg by mouth daily.) 90 tablet 1   Tiotropium Bromide-Olodaterol (STIOLTO RESPIMAT) 2.5-2.5 MCG/ACT AERS Inhale 2 puffs into the lungs daily. 12 g 3   triamterene-hydrochlorothiazide (MAXZIDE-25) 37.5-25 MG tablet TAKE 1 TABLET EVERY DAY (Patient taking differently: Take 1 tablet by mouth daily. TAKE 1 TABLET EVERY DAY) 90 tablet 1   No current facility-administered medications for this visit.    PHYSICAL EXAMINATION: ECOG PERFORMANCE STATUS: {CHL ONC ECOG PS:[id number]}  There were no vitals filed for this visit. There were no vitals filed for this visit.  BREAST:*** No palpable masses or nodules in either right or left breasts. No palpable axillary supraclavicular or infraclavicular adenopathy no breast tenderness or nipple discharge. (exam performed in the presence of a chaperone)  LABORATORY DATA:  I have reviewed the data as listed    Latest Ref Rng & Units 09/13/2022    2:43 PM 01/21/2022    1:08 AM 07/28/2021   10:19 AM  CMP  Glucose 70 - 99 mg/dL 94  104  87   BUN 8 - 23 mg/dL _0 Creatinine  0.44 - 1.00 mg/dL 0.83  1.04  0.95   Sodium 135 - 145 mmol/L 144  140  142   Potassium 3.5 - 5.1 mmol/L 3.0  3.7  3.3   Chloride 98 - 111 mmol/L 107  98  98   CO2 22 - 32 mmol/L 30  31  37   Calcium 8.9 - 10.3 mg/dL 7.8  9.3  9.3   Total Protein 6.5 - 8.1 g/dL  6.7  7.6   Total Bilirubin 0.3 - 1.2 mg/dL  0.5  0.4   Alkaline Phos 38 - 126 U/L  65  76   AST 15 - 41 U/L  17  15   ALT 0 - 44 U/L  11  10     Lab Results  Component Value Date   WBC  8.2 09/13/2022   HGB 8.3 (L) 09/13/2022   HCT 26.2 (L) 09/13/2022   MCV 81.6 09/13/2022   PLT 239 09/13/2022   NEUTROABS 4.9 01/21/2022    ASSESSMENT & PLAN:  No problem-specific Assessment & Plan notes found for this encounter.    No orders of the defined types were placed in this encounter.  The patient has a good understanding of the overall plan. she agrees with it. she will call with any problems that may develop before the next visit here. Total time spent: 30 mins including face to face time and time spent for planning, charting and co-ordination of care   Suzzette Righter, [place] 10/24/22    I Gardiner Coins am scribing for Dr. Lindi Adie  ***

## 2022-10-24 NOTE — Assessment & Plan Note (Deleted)
Right breast invasive ductal carcinoma status post lumpectomy on 08/18/2015, grade 2, 0.6 cm, with DCIS high-grade, margins negative, 0/2 lymph nodes negative, ER 100%, PR positive, HER-2 negative, Ki-67 20%, T1bN0 stage IA,   Adjuvant radiation from 09/21/2015 to 10/20/2015   Current treatment: Adjuvant antiestrogen therapy with anastrozole 1 mg daily 5 years started January 2017 I discussed the duration of antiestrogen therapy.  She completed 5 years and going on her sixth year.  Since she is tolerating it extremely well I recommended continuing for 7 years.     Severe hot flashes: On Neurontin 600 mg at bedtime and 300 mg in a.m.  This has controlled her hot flashes very well.   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 Breast exam : 10/24/2022: Benign 2. Mammogram 02/22/2021: Benign breast density category B Bone density scheduled for October 2022   Return to clinic in 1 year for follow-up   

## 2022-11-01 ENCOUNTER — Other Ambulatory Visit: Payer: Self-pay | Admitting: Internal Medicine

## 2022-11-09 ENCOUNTER — Other Ambulatory Visit: Payer: Self-pay | Admitting: Internal Medicine

## 2022-11-09 ENCOUNTER — Telehealth: Payer: Self-pay | Admitting: Internal Medicine

## 2022-11-09 MED ORDER — PREDNISONE 10 MG PO TABS: ORAL_TABLET | ORAL | 2 refills | Status: DC

## 2022-11-09 MED ORDER — PREDNISONE 5 MG PO TABS: ORAL_TABLET | ORAL | 2 refills | Status: DC

## 2022-11-09 MED ORDER — AZITHROMYCIN 250 MG PO TABS: ORAL_TABLET | ORAL | 0 refills | Status: DC

## 2022-11-09 NOTE — Telephone Encounter (Signed)
Patient would like the nurse to call regarding coughing and wheezing she has had for about a week.  Please advise.  CB# [phone]

## 2022-11-09 NOTE — Telephone Encounter (Signed)
Called and spoke with pt letting her know recs per MW and she verbalized understanding. Verified preferred pharmacy and sent meds in for pt. Nothing further needed.

## 2022-11-09 NOTE — Telephone Encounter (Signed)
Zpak Increase prednisone to 20 mg until better then rapid taper back to 5 mg

## 2022-11-09 NOTE — Telephone Encounter (Signed)
Spoke with pt who states she had increased productive cough (thick green), chills and loss of appetite  and increased wheezing over last 3 - 4 days. Pt denies fever. Pt states albuterol nebulizer did help her sleep better when taken. Pt is currently on Prednisone 5 mg daily and Stiolto. Pt is update on flu shot, has not had RSV shot and is unsure if she has had currently Covid vaccine. Pt did take home Covid test which was negative. Preferred pharmacy is [facility] on [address]. Dr. Melvyn Novas please advise.

## 2022-11-16 ENCOUNTER — Other Ambulatory Visit: Payer: Self-pay | Admitting: Hematology and Oncology

## 2022-11-16 DIAGNOSIS — Z170 Estrogen receptor positive status [ER+]: Secondary | ICD-10-CM

## 2022-11-17 ENCOUNTER — Telehealth: Payer: Self-pay | Admitting: Hematology and Oncology

## 2022-11-17 NOTE — Telephone Encounter (Signed)
Scheduled appointment per 1/5 staff message. Patient is aware.

## 2022-12-09 ENCOUNTER — Other Ambulatory Visit: Payer: Self-pay | Admitting: Hematology and Oncology

## 2022-12-09 DIAGNOSIS — C50411 Malignant neoplasm of upper-outer quadrant of right female breast: Secondary | ICD-10-CM

## 2022-12-11 ENCOUNTER — Other Ambulatory Visit: Payer: Self-pay | Admitting: Hematology and Oncology

## 2022-12-11 DIAGNOSIS — C50411 Malignant neoplasm of upper-outer quadrant of right female breast: Secondary | ICD-10-CM

## 2022-12-29 NOTE — Progress Notes (Signed)
Patient Care Team: Sonia Side., FNP as PCP - General (Family Medicine) Nicholas Lose, MD as Consulting Physician (Hematology and Oncology) Eppie Gibson, MD as Attending Physician (Radiation Oncology) Stark Klein, MD as Consulting Physician (General Surgery)  DIAGNOSIS: No diagnosis found.  SUMMARY OF ONCOLOGIC HISTORY: Oncology History  Breast cancer of upper-outer quadrant of right female breast ([place])  07/08/2015 Mammogram   Right breast calcifications 11 mm span   08/04/2015 Initial Diagnosis   Biopsy right breast: Invasive ductal carcinoma with DCIS with calcifications, lymphovascular invasion is identified, grade 2-3, ER 100%, PR 100% Ki-67 20%, HER-2 negative ratio 1.46   08/04/2015 Clinical Stage   Stage IA: T1b N0   08/18/2015 Definitive Surgery   Right  lumpectomy: invasive ductal carcinoma, grade 2, 0.6 cm, with DCIS high-grade, margins negative, 0/2 lymph nodes negative, ER 100%, PR positive, HER-2 negative, Ki-67 20%   08/18/2015 Pathologic Stage   Stage IA: T1b N0   08/18/2015 Oncotype testing   Insufficient tissue to perform   09/21/2015 - 10/20/2015 Radiation Therapy   Adjuvant radiation therapy: 1) Right Breast / 40.05 Gy in 15 fractions 2) Right breast boost / 10 Gy in 5 fractions   10/08/2015 - 10/11/2015 Hospital Admission   cellulitis of the right breast   11/16/2015 -  Anti-estrogen oral therapy   Anastrozole 1 mg daily once hot flashes better controlled. Planned duration of therapy 5 years.      CHIEF COMPLIANT:   INTERVAL HISTORY: JIMMYA BOWERMASTER is a   ALLERGIES:  is allergic to codeine.  MEDICATIONS:  Current Outpatient Medications  Medication Sig Dispense Refill   acetaminophen (TYLENOL) 325 MG tablet Take 650 mg by mouth every 6 (six) hours as needed for mild pain or moderate pain.     albuterol (PROVENTIL) (2.5 MG/3ML) 0.083% nebulizer solution Take 3 mLs (2.5 mg total) by nebulization every 6 (six) hours as needed for wheezing or  shortness of breath. 75 mL 1   albuterol (VENTOLIN HFA) 108 (90 Base) MCG/ACT inhaler INHALE 2 PUFFS EVERY 6 HOURS AS NEEDED FOR WHEEZING OR SHORTNESS OF BREATH (Patient taking differently: Inhale 2 puffs into the lungs every 6 (six) hours as needed for shortness of breath or wheezing.) 54 g 0   anastrozole (ARIMIDEX) 1 MG tablet TAKE 1 TABLET BY MOUTH DAILY 30 tablet 0   aspirin EC 81 MG tablet Take 1 tablet (81 mg total) by mouth daily. Start 02/23/19     atorvastatin (LIPITOR) 20 MG tablet TAKE 1 TABLET (20 MG TOTAL) BY MOUTH DAILY. 90 tablet 3   azithromycin (ZITHROMAX) 250 MG tablet Take two today and then one daily until finished. 6 tablet 0   ferrous sulfate 325 (65 FE) MG tablet Take 1 tablet (325 mg total) by mouth daily with breakfast. (Patient taking differently: Take 325 mg by mouth every other day.) 90 tablet 0   fluticasone (FLONASE) 50 MCG/ACT nasal spray USE 2 SPRAYS IN BOTH  NOSTRILS DAILY AS NEEDED  FOR ALLERGIES (Patient taking differently: Place 2 sprays into both nostrils daily as needed for allergies.) 48 g 11   gabapentin (NEURONTIN) 300 MG capsule TAKE 1 CAPSULE BY MOUTH IN THE  MORNING AND 2 CAPSULES BY MOUTH  IN THE EVENING 90 capsule 0   hydroxychloroquine (PLAQUENIL) 200 MG tablet TAKE 1 TABLET BY MOUTH  DAILY 90 tablet 3   metoprolol tartrate (LOPRESSOR) 25 MG tablet Take 1 tablet (25 mg total) by mouth 2 (two) times daily. (Patient taking differently:  Take 25 mg by mouth daily.) 180 tablet 1   montelukast (SINGULAIR) 10 MG tablet Take 10 mg by mouth at bedtime.     Multiple Vitamin (MULTIVITAMIN) capsule Take 1 capsule by mouth daily.     omeprazole (PRILOSEC) 20 MG capsule TAKE 1 CAPSULE EVERY DAY (Patient taking differently: Take 20 mg by mouth daily.) 90 capsule 1   OXYGEN 2lpm with rest and 3lpm with exertion     predniSONE (DELTASONE) 10 MG tablet 2 daily x 2 weeks 60 tablet 0   predniSONE (DELTASONE) 10 MG tablet INCREASE TO 20 MG UNTIL BETTER THEN WORK BACK TO  PREVIOUS DOSE OF 5 MG DAILY 160 tablet 2   predniSONE (DELTASONE) 5 MG tablet 5 mg take 2 until better then gradually taper to one half daily 100 tablet 3   predniSONE (DELTASONE) 5 MG tablet After completing 47m until better, work back to previous dose of 5 mg daily 60 tablet 2   sertraline (ZOLOFT) 50 MG tablet TAKE 1 TABLET EVERY DAY (Patient taking differently: Take 50 mg by mouth daily.) 90 tablet 1   STIOLTO RESPIMAT 2.5-2.5 MCG/ACT AERS USE 2 INHALATIONS BY MOUTH DAILY 12 g 3   triamterene-hydrochlorothiazide (MAXZIDE-25) 37.5-25 MG tablet TAKE 1 TABLET EVERY DAY (Patient taking differently: Take 1 tablet by mouth daily. TAKE 1 TABLET EVERY DAY) 90 tablet 1   No current facility-administered medications for this visit.    PHYSICAL EXAMINATION: ECOG PERFORMANCE STATUS: {CHL ONC ECOG [phone]}  There were no vitals filed for this visit. There were no vitals filed for this visit.  BREAST:*** No palpable masses or nodules in either right or left breasts. No palpable axillary supraclavicular or infraclavicular adenopathy no breast tenderness or nipple discharge. (exam performed in the presence of a chaperone)  LABORATORY DATA:  I have reviewed the data as listed    Latest Ref Rng & Units 09/13/2022    2:43 PM 01/21/2022    1:08 AM 07/28/2021   10:19 AM  CMP  Glucose 70 - 99 mg/dL 94  104  87   BUN 8 - 23 mg/dL 9  12  14   $ Creatinine 0.44 - 1.00 mg/dL 0.83  1.04  0.95   Sodium 135 - 145 mmol/L 144  140  142   Potassium 3.5 - 5.1 mmol/L 3.0  3.7  3.3   Chloride 98 - 111 mmol/L 107  98  98   CO2 22 - 32 mmol/L 30  31  37   Calcium 8.9 - 10.3 mg/dL 7.8  9.3  9.3   Total Protein 6.5 - 8.1 g/dL  6.7  7.6   Total Bilirubin 0.3 - 1.2 mg/dL  0.5  0.4   Alkaline Phos 38 - 126 U/L  65  76   AST 15 - 41 U/L  17  15   ALT 0 - 44 U/L  11  10     Lab Results  Component Value Date   WBC 8.2 09/13/2022   HGB 8.3 (L) 09/13/2022   HCT 26.2 (L) 09/13/2022   MCV 81.6 09/13/2022   PLT  239 09/13/2022   NEUTROABS 4.9 01/21/2022    ASSESSMENT & PLAN:  No problem-specific Assessment & Plan notes found for this encounter.    No orders of the defined types were placed in this encounter.  The patient has a good understanding of the overall plan. she agrees with it. she will call with any problems that may develop before the next visit here. Total  time spent: 30 mins including face to face time and time spent for planning, charting and co-ordination of care   Suzzette Righter, [place] 12/29/22    I Gardiner Coins am acting as a Education administrator for [employer]  ***

## 2023-01-01 ENCOUNTER — Other Ambulatory Visit: Payer: Self-pay

## 2023-01-01 ENCOUNTER — Inpatient Hospital Stay: Payer: 59 | Attending: Hematology and Oncology | Admitting: Hematology and Oncology

## 2023-01-01 VITALS — BP 126/77 | HR 81 | Temp 97.5°F | Resp 16 | Ht 65.0 in | Wt 187.0 lb

## 2023-01-01 DIAGNOSIS — Z79811 Long term (current) use of aromatase inhibitors: Secondary | ICD-10-CM | POA: Insufficient documentation

## 2023-01-01 DIAGNOSIS — Z170 Estrogen receptor positive status [ER+]: Secondary | ICD-10-CM | POA: Diagnosis not present

## 2023-01-01 DIAGNOSIS — C50411 Malignant neoplasm of upper-outer quadrant of right female breast: Secondary | ICD-10-CM | POA: Diagnosis present

## 2023-01-01 DIAGNOSIS — Z79899 Other long term (current) drug therapy: Secondary | ICD-10-CM | POA: Insufficient documentation

## 2023-01-01 DIAGNOSIS — Z923 Personal history of irradiation: Secondary | ICD-10-CM | POA: Insufficient documentation

## 2023-01-01 NOTE — Assessment & Plan Note (Signed)
Right breast invasive ductal carcinoma status post lumpectomy on 08/18/2015, grade 2, 0.6 cm, with DCIS high-grade, margins negative, 0/2 lymph nodes negative, ER 100%, PR positive, HER-2 negative, Ki-67 20%, T1bN0 stage IA,   Adjuvant radiation from 09/21/2015 to 10/20/2015   Current treatment: Adjuvant antiestrogen therapy with anastrozole 1 mg daily 5 years started January 2017 She completed 7 years of antiestrogen therapy     Severe hot flashes: On Neurontin 600 mg at bedtime and 300 mg in a.m.  This has controlled her hot flashes very well.   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 Breast exam : 01/01/2023: Benign 2. Mammogram 02/22/2021: Benign breast density category B, patient needs a new mammogram. Bone density scheduled for October 2022 3.  CT angiogram 01/21/2022: 2 cm pulmonary nodule right upper lobe 4.  Ultrasound abdomen 03/02/2022: Cirrhosis of liver   Recommended that she follow-up with pulmonology regarding the lung nodule.  I will send a message to Dr. Melvyn Novas

## 2023-01-04 ENCOUNTER — Ambulatory Visit: Payer: 59 | Admitting: Internal Medicine

## 2023-01-04 NOTE — Progress Notes (Deleted)
Subjective:    Patient ID: [name], female   DOB: [birth year]    MRN: [id number]    Brief patient profile:  74  yobf quit smoking 10/2004 dx by transbronchial biopsy 5/92 with NCG  consistent with sarcoid. Since that time she's been off and on prednisone multiple  times with flares of coughing dyspnea and skin involvement > weaned off chronic prednisone Feb 2012 and placed back on 10/10/16 for deteriorating obstructive changes on pfts  With resp failure/ 02 dep        History of Present Illness  02/20/2017  f/u ov/[name] re:   GOLD III/ pred 10 mg daily and bevespi 2bid  and alb rare  Chief Complaint  Patient presents with   Follow-up    6wk rov. pt states breathing is baseline. pt reports of sob with exertion & occ prod cough with green mucus mainly in the morning   Doe better :  [facility] = can't walk a nl pace on a flat grade s sob but does fine slow and flat eg shopping on  2lpm / ok at rest s 02  Uses 02 2lpm sleeping  rec Drop prednisone to 10 mg one half daily       11/26/2017  f/u ov/[name] re:  Sarcoid/skin involvement chronic cough ? Sinus dz/ using med calendar well / pednisone 10 mg daily  Chief Complaint  Patient presents with   Follow-up    Cough has improved some, but still producing some greenish yellow sputum first thing in the am.  She has not had to use her albuterol inhaler. She c/o shakiness in her hands for the past couple of months.    less cough p augmentin but still am sputum green / no noct symptoms Doing well with rehab but sometimes has to turn 02 up to 4 lpm there but doesn't do so in other settings and concerned about wt gain  rec Start plaquenil 200 mg daily  Ok to adjust the prednisone 73m  from 1-2 pills in am as per med calendar See calendar for specific medication instructions   11/22/2018  f/u ov/[name] re:  Sarcooid/ copd/ 02 dep resp failure/ indolent onset x 3  weeks no appetite fatigue  Increased sob on pred 5 mg daily / no med calendar  Chief  Complaint  Patient presents with   Follow-up    Pt c/o increased SOB and cough the past few days. Her cough is prod at times with yellow to clear sputum. She states her mouth is very dry in the mornings. Her appetite is poor and she is down 9 lbs since the last visit. She is using her albuterol inhaler 2 x per wk on average.    Dyspnea:  mb and back flat ok on 3lpm Pulsed when walks  Cough: none Sleeping: on side bed is horizontal SABA use: couple of times a week 02: 2lpm and up to 3lpm walk rec Please remember to go to the lab department   for your tests - we will call you with the results when they are available. 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 She uses her albuterol inhaler about 1-2 x per week. She rarely uses neb. She has been having occ cough, esp in the am with thick, green sputum.   Dyspnea:  grocery shopping [employer] with sats  typically above 90% on 2 POC  Cough: green x one week, getting better with increase pred to 5 mg from 5-2.5-5  Sleeping: bed is flat, 2 pillows  SABA use: as above 02: 2lpm Covid status:   vax x 2, never infected  Mouth dry "feels like a rash"  Rec Zpak should clear the green mucus  When breathing / cough bad > prednisone  5 mg x 2 until better then work back to 2.5 mg daily  Make sure you check your oxygen saturation  at your highest level of  activity Get the new booster for covid 19    03/14/2022  f/u ov/[name] re: sarcoidosis/ copd gold 4 02 dep  maint on stiolto 2 puffs each am   Chief Complaint  Patient presents with   Follow-up    ER in April, saw a nodule.  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 / not using [employer] parking  Cough: ok presently  Sleeping: flat bed 2 pillows  SABA use: once or twice a week  02: 2lpm hs ,  walking does 3lpm POC Rec We will be referring you to Dr Henrene Pastor re your ultraound findings Make sure you check your oxygen saturation  AT  your highest level of activity (not after you stop)   to be sure it stays over 90%  1st of June increase your prednisone to 20 mg daily until the CT scan mid  June  2023 and I'll call you the results - wean back to where you are are  Please schedule a follow up visit in 3 months but call sooner if needed    08/18/2022  f/u ov/[name] re: sarcoidosis/copd  maint on stiolto /pred 10  per day  Chief Complaint  Patient presents with   Follow-up    Follow up for cirrhosis. Pt states that the Stiolto inhaler is working well for her with no issues notes. PRN Albuterol.    Dyspnea:  [employer] ok / not using  [employer] parking  Cough: min in am  Sleeping: flat bed 2 pillows  SABA use: one or twice  02: 2lpm hs/ 2-3 POC with walking  Covid status:   x 3  Rec Ceiling for prednisone is 10 mg daily and floor is 2.5 mg  Make sure you check your oxygen saturation  AT  your highest level of activity (not after you stop)   to be sure it stays over 90%   Please schedule a follow up visit in 3 months but call sooner if needed     01/04/2023  f/u ov/[name] re: ***   maint on ***  f/u SPN on CT 01/21/22  No chief complaint on file.   Dyspnea:  *** Cough: *** Sleeping: *** SABA use: *** 02: *** Covid status:   *** Lung cancer screening :  ***    No obvious day to day or daytime variability or assoc excess/ purulent sputum or mucus plugs or hemoptysis or cp or chest tightness,  subjective wheeze or overt  sinus or hb symptoms.   *** without nocturnal  or early am exacerbation  of respiratory  c/o's or need for noct saba. Also denies any obvious fluctuation of symptoms with weather or environmental changes or other aggravating or alleviating factors except as outlined above   No unusual exposure hx or h/o childhood pna/ asthma or knowledge of premature birth.  Current Allergies, Complete Past Medical History, Past Surgical History, Family History, and Social History were reviewed in [employer] record.  ROS  The following are not active complaints unless bolded Hoarseness, sore throat, dysphagia, dental problems, itching, sneezing,  nasal congestion or discharge of excess mucus or purulent secretions, ear ache,   fever, chills, sweats, unintended wt loss or wt gain, classically pleuritic or exertional cp,  orthopnea pnd or arm/hand swelling  or leg swelling, presyncope, palpitations, abdominal pain, anorexia, nausea, vomiting, diarrhea  or change in bowel habits or change in bladder habits, change in stools or change in urine, dysuria, hematuria,  rash, arthralgias, visual complaints, headache, numbness, weakness or ataxia or problems with walking or coordination,  change in mood or  memory.        No outpatient medications have been marked as taking for the 01/04/23 encounter (Appointment) with Tanda Rockers, MD.                    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 Unice Cobble  -h/o Daily prednisone since 2007   > off completely Feb 2012 with no problem> restarted  09/2016  ? 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 Some better but no worse off 04/2011 - Rehab started mid aug 2018  BACK PAIN, LOW (ICD-724.2)  ANEMIA, IRON DEFICIENCY, UNSPEC. (ICD-280.9)  Health Maintenance.......................................................   [facility]   Family History:  crohn`s in daughter, M-lupus, htn, asthma,  no Ca, DM, CAD    Social History:   lives with twin children and grandson. single; >20 pack year history, quit in 2005; no EtOH  Laid off  from works for [employer] Sept 2013           Objective:   Physical Exam  Wts  01/04/2023    ***  08/18/2022   187  03/14/2022     199  07/28/2021    205  08/26/2020  219  11/17/2019      223   12/30/2018  188  wt 166 Oct 12, 2009 >170 October 28, 2010 > 169 06/29/2011 >>171 08/24/2011 > 01/15/2012  171 > 04/24/2012 176 > 06/28/2012  175> 08/13/2012 > 08/30/2012  173 > 170 11/28/2012 > 01/29/2013  176 > 174 >173 06/16/2013 > 171 09/18/2013 > 12/19/2013 177 >179 04/13/2014>  04/23/2014 177 > 08/21/2014 184 >182 11/27/2014 > 05/12/2015 177  > 05/11/2016   177 > 10/27/2016  175 > 11/09/2016 182 > 01/09/2017 198 > 02/20/2017  206 >  07/17/2017   209 >  09/03/2017   210 > 09/28/2017  206  >  11/26/2017   209 > 01/10/2018  210> 02/19/2018  206> 05/21/2018  198 > 08/06/2018  197 > 08/21/2018  196 > 11/22/2018  187    Vital signs reviewed  01/04/2023  - Note at rest 02 sats  ***% on ***   General appearance:    ***        Mod bar***            Assessment:

## 2023-01-04 NOTE — Telephone Encounter (Signed)
No show 01/04/2023 but needs CT s contrast f/u nodule then I will call her with results

## 2023-01-18 ENCOUNTER — Ambulatory Visit: Payer: 59 | Admitting: Internal Medicine

## 2023-01-18 NOTE — Progress Notes (Deleted)
Subjective:    Patient ID: [name], female   DOB: [birth year]    MRN: [id number]    Brief patient profile:  75  yobf quit smoking 10/2004 dx by transbronchial biopsy 5/92 with NCG  consistent with sarcoid. Since that time she's been off and on prednisone multiple  times with flares of coughing dyspnea and skin involvement > weaned off chronic prednisone Feb 2012 and placed back on 10/10/16 for deteriorating obstructive changes on pfts  With resp failure/ 02 dep        History of Present Illness  02/20/2017  f/u ov/[name] re:   GOLD III/ pred 10 mg daily and bevespi 2bid  and alb rare  Chief Complaint  Patient presents with   Follow-up    6wk rov. pt states breathing is baseline. pt reports of sob with exertion & occ prod cough with green mucus mainly in the morning   Doe better :  [facility] = can't walk a nl pace on a flat grade s sob but does fine slow and flat eg shopping on  2lpm / ok at rest s 02  Uses 02 2lpm sleeping  rec Drop prednisone to 10 mg one half daily       11/26/2017  f/u ov/[name] re:  Sarcoid/skin involvement chronic cough ? Sinus dz/ using med calendar well / pednisone 10 mg daily  Chief Complaint  Patient presents with   Follow-up    Cough has improved some, but still producing some greenish yellow sputum first thing in the am.  She has not had to use her albuterol inhaler. She c/o shakiness in her hands for the past couple of months.    less cough p augmentin but still am sputum green / no noct symptoms Doing well with rehab but sometimes has to turn 02 up to 4 lpm there but doesn't do so in other settings and concerned about wt gain  rec Start plaquenil 200 mg daily  Ok to adjust the prednisone '5mg'$   from 1-2 pills in am as per med calendar See calendar for specific medication instructions     03/14/2022  f/u ov/[name] re: sarcoidosis/ copd gold 4 02 dep  maint on stiolto 2 puffs each am   Chief Complaint  Patient presents with   Follow-up    ER in April,  saw a nodule.  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 / not using [employer] parking  Cough: ok presently  Sleeping: flat bed 2 pillows  SABA use: once or twice a week  02: 2lpm hs ,  walking does 3lpm POC Rec We will be referring you to Dr Henrene Pastor re your ultraound findings Make sure you check your oxygen saturation  AT  your highest level of activity (not after you stop)   to be sure it stays over 90%  1st of June increase your prednisone to 20 mg daily until the CT scan mid  June  2023 and I'll call you the results - wean back to where you are are  Please schedule a follow up visit in 3 months but call sooner if needed    08/18/2022  f/u ov/[name] re: sarcoidosis/copd  maint on stiolto /pred 10  per day  Chief Complaint  Patient presents with   Follow-up    Follow up for cirrhosis. Pt states that the Stiolto inhaler is working well for her with no issues notes. PRN Albuterol.    Dyspnea:  [employer] ok /  not using  [employer] parking  Cough: min in am  Sleeping: flat bed 2 pillows  SABA use: one or twice  02: 2lpm hs/ 2-3 POC with walking  Covid status:   x 3  Rec Ceiling for prednisone is 10 mg daily and floor is 2.5 mg  Make sure you check your oxygen saturation  AT  your highest level of activity (not after you stop)   to be sure it stays over 90%      01/18/2023  f/u ov/[name] re: ***   maint on ***  f/u SPN on CT 01/21/22  No chief complaint on file.   Dyspnea:  *** Cough: *** Sleeping: *** SABA use: *** 02: *** Covid status:   *** Lung cancer screening :  ***    No obvious day to day or daytime variability or assoc excess/ purulent sputum or mucus plugs or hemoptysis or cp or chest tightness, subjective wheeze or overt sinus or hb symptoms.   *** without nocturnal  or early am exacerbation  of respiratory  c/o's or need for noct saba. Also denies any obvious fluctuation of symptoms with weather or environmental changes or other aggravating or alleviating factors except as  outlined above   No unusual exposure hx or h/o childhood pna/ asthma or knowledge of premature birth.  Current Allergies, Complete Past Medical History, Past Surgical History, Family History, and Social History were reviewed in [employer] record.  ROS  The following are not active complaints unless bolded Hoarseness, sore throat, dysphagia, dental problems, itching, sneezing,  nasal congestion or discharge of excess mucus or purulent secretions, ear ache,   fever, chills, sweats, unintended wt loss or wt gain, classically pleuritic or exertional cp,  orthopnea pnd or arm/hand swelling  or leg swelling, presyncope, palpitations, abdominal pain, anorexia, nausea, vomiting, diarrhea  or change in bowel habits or change in bladder habits, change in stools or change in urine, dysuria, hematuria,  rash, arthralgias, visual complaints, headache, numbness, weakness or ataxia or problems with walking or coordination,  change in mood or  memory.        No outpatient medications have been marked as taking for the 01/18/23 encounter (Appointment) with Tanda Rockers, MD.                    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 Unice Cobble  -h/o Daily prednisone since 2007   > off completely Feb 2012 with no problem> restarted  09/2016  ? 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 Some better but no worse off 04/2011 - Rehab started mid aug 2018  BACK PAIN,  LOW (ICD-724.2)  ANEMIA, IRON DEFICIENCY, UNSPEC. (ICD-280.9)  Health Maintenance.......................................................   [facility]   Family History:  crohn`s in daughter, M-lupus, htn, asthma,  no Ca, DM, CAD    Social History:   lives with twin children and grandson. single; >20 pack year history, quit in 2005; no EtOH  Laid off  from  works for [employer] Sept 2013           Objective:   Physical Exam  Wts  01/18/2023    ***  08/18/2022   187  03/14/2022     199  07/28/2021    205  08/26/2020  219  11/17/2019      223   12/30/2018  188  wt 166 Oct 12, 2009 >170 October 28, 2010 > 169 06/29/2011 >>171 08/24/2011 > 01/15/2012  171 > 04/24/2012 176 > 06/28/2012  175> 08/13/2012 > 08/30/2012  173 > 170 11/28/2012 > 01/29/2013  176 > 174 >173 06/16/2013 > 171 09/18/2013 > 12/19/2013 177 >179 04/13/2014>  04/23/2014 177 > 08/21/2014 184 >182 11/27/2014 > 05/12/2015 177  > 05/11/2016   177 > 10/27/2016  175 > 11/09/2016 182 > 01/09/2017 198 > 02/20/2017  206 >  07/17/2017   209 >  09/03/2017   210 > 09/28/2017  206  > 11/26/2017   209 > 01/10/2018  210> 02/19/2018  206> 05/21/2018  198 > 08/06/2018  197 > 08/21/2018  196 > 11/22/2018  187    Vital signs reviewed  01/18/2023  - Note at rest 02 sats  ***% on ***   General appearance:    ***        Mod bar***            Assessment:

## 2023-03-11 NOTE — Progress Notes (Deleted)
Subjective:    Patient ID: [name], female   DOB: [birth year]    MRN: [id number]    Brief patient profile:  74  yobf quit smoking 10/2004 dx by transbronchial biopsy 5/92 with NCG  consistent with sarcoid. Since that time she's been off and on prednisone multiple  times with flares of coughing dyspnea and skin involvement > weaned off chronic prednisone Feb 2012 and placed back on 10/10/16 for deteriorating obstructive changes on pfts  With resp failure/ 02 dep        History of Present Illness  02/20/2017  f/u ov/[name] re:   GOLD III/ pred 10 mg daily and bevespi 2bid  and alb rare  Chief Complaint  Patient presents with   Follow-up    6wk rov. pt states breathing is baseline. pt reports of sob with exertion & occ prod cough with green mucus mainly in the morning   Doe better :  [facility] = can't walk a nl pace on a flat grade s sob but does fine slow and flat eg shopping on  2lpm / ok at rest s 02  Uses 02 2lpm sleeping  rec Drop prednisone to 10 mg one half daily       11/26/2017  f/u ov/[name] re:  Sarcoid/skin involvement chronic cough ? Sinus dz/ using med calendar well / pednisone 10 mg daily  Chief Complaint  Patient presents with   Follow-up    Cough has improved some, but still producing some greenish yellow sputum first thing in the am.  She has not had to use her albuterol inhaler. She c/o shakiness in her hands for the past couple of months.    less cough p augmentin but still am sputum green / no noct symptoms Doing well with rehab but sometimes has to turn 02 up to 4 lpm there but doesn't do so in other settings and concerned about wt gain  rec Start plaquenil 200 mg daily  Ok to adjust the prednisone 73m  from 1-2 pills in am as per med calendar See calendar for specific medication instructions   11/22/2018  f/u ov/[name] re:  Sarcooid/ copd/ 02 dep resp failure/ indolent onset x 3  weeks no appetite fatigue  Increased sob on pred 5 mg daily / no med calendar  Chief  Complaint  Patient presents with   Follow-up    Pt c/o increased SOB and cough the past few days. Her cough is prod at times with yellow to clear sputum. She states her mouth is very dry in the mornings. Her appetite is poor and she is down 9 lbs since the last visit. She is using her albuterol inhaler 2 x per wk on average.    Dyspnea:  mb and back flat ok on 3lpm Pulsed when walks  Cough: none Sleeping: on side bed is horizontal SABA use: couple of times a week 02: 2lpm and up to 3lpm walk rec Please remember to go to the lab department   for your tests - we will call you with the results when they are available. 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 She uses her albuterol inhaler about 1-2 x per week. She rarely uses neb. She has been having occ cough, esp in the am with thick, green sputum.   Dyspnea:  grocery shopping [employer] with sats  typically above 90% on 2 POC  Cough: green x one week, getting better with increase pred to 5 mg from 5-2.5-5  Sleeping: bed is flat, 2 pillows  SABA use: as above 02: 2lpm Covid status:   vax x 2, never infected  Mouth dry "feels like a rash"  Rec Zpak should clear the green mucus  When breathing / cough bad > prednisone  5 mg x 2 until better then work back to 2.5 mg daily  Make sure you check your oxygen saturation  at your highest level of  activity Get the new booster for covid 19    03/14/2022  f/u ov/[name] re: sarcoidosis/ copd gold 4 02 dep  maint on stiolto 2 puffs each am   Chief Complaint  Patient presents with   Follow-up    ER in April, saw a nodule.  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 / not using [employer] parking  Cough: ok presently  Sleeping: flat bed 2 pillows  SABA use: once or twice a week  02: 2lpm hs ,  walking does 3lpm POC Rec We will be referring you to Dr Marina Goodell re your ultraound findings Make sure you check your oxygen saturation  AT  your highest level of activity (not after you stop)   to be sure it stays over 90%  1st of June increase your prednisone to 20 mg daily until the CT scan mid  June  2023 and I'll call you the results - wean back to where you are are  Please schedule a follow up visit in 3 months but call sooner if needed    08/18/2022  f/u ov/[name] re: sarcoidosis/copd  maint on stiolto /pred 10  per day  Chief Complaint  Patient presents with   Follow-up    Follow up for cirrhosis. Pt states that the Stiolto inhaler is working well for her with no issues notes. PRN Albuterol.    Dyspnea:  [employer] ok / not using  [employer] parking  Cough: min in am  Sleeping: flat bed 2 pillows  SABA use: one or twice  02: 2lpm hs/ 2-3 POC with walking  Covid status:   x 3  Rec Ceiling for prednisone is 10 mg daily and floor is 2.5 mg  Make sure you check your oxygen saturation  AT  your highest level of activity (not after you stop)   to be sure it stays over 90%     03/12/2023  f/u ov/[name] re: ***   maint on ***  No chief complaint on file.   Dyspnea:  *** Cough: *** Sleeping: *** SABA use: *** 02: *** Covid status:   *** Lung cancer screening :  ***    No obvious day to day or daytime variability or assoc excess/ purulent sputum or mucus plugs or hemoptysis or cp or chest tightness, subjective wheeze or overt sinus or hb symptoms.   *** without nocturnal  or early am  exacerbation  of respiratory  c/o's or need for  noct saba. Also denies any obvious fluctuation of symptoms with weather or environmental changes or other aggravating or alleviating factors except as outlined above   No unusual exposure hx or h/o childhood pna/ asthma or knowledge of premature birth.  Current Allergies, Complete Past Medical History, Past Surgical History, Family History, and Social History were reviewed in [employer] record.  ROS  The following are not active complaints unless bolded Hoarseness, sore throat, dysphagia, dental problems, itching, sneezing,  nasal congestion or discharge of excess mucus or purulent secretions, ear ache,   fever, chills, sweats, unintended wt loss or wt gain, classically pleuritic or exertional cp,  orthopnea pnd or arm/hand swelling  or leg swelling, presyncope, palpitations, abdominal pain, anorexia, nausea, vomiting, diarrhea  or change in bowel habits or change in bladder habits, change in stools or change in urine, dysuria, hematuria,  rash, arthralgias, visual complaints, headache, numbness, weakness or ataxia or problems with walking or coordination,  change in mood or  memory.        No outpatient medications have been marked as taking for the 03/12/23 encounter (Appointment) with Nyoka Cowden, MD.                  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 Marga Melnick  -h/o Daily prednisone since 2007   > off completely Feb 2012 with no problem> restarted  09/2016  ? 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 Some better but no worse off 04/2011 - Rehab started mid aug 2018  BACK PAIN, LOW (ICD-724.2)  ANEMIA, IRON DEFICIENCY, UNSPEC. (ICD-280.9)  Health Maintenance.......................................................   [facility]   Family History:  crohn`s in daughter, M-lupus, htn, asthma,  no Ca, DM, CAD    Social History:   lives with twin children and grandson. single; >20 pack year history, quit in 2005; no EtOH  Laid off  from works for [employer] Sept 2013           Objective:   Physical Exam  Wts  03/12/2023    ***  08/18/2022   187  03/14/2022     199  07/28/2021    205  08/26/2020  219  11/17/2019      223   12/30/2018  188  wt 166 Oct 12, 2009 >170 October 28, 2010 > 169 06/29/2011 >>171 08/24/2011 > 01/15/2012  171 > 04/24/2012 176 > 06/28/2012  175> 08/13/2012 > 08/30/2012  173 > 170 11/28/2012 > 01/29/2013  176 > 174 >173 06/16/2013 > 171 09/18/2013 > 12/19/2013 177 >179 04/13/2014>  04/23/2014 177 > 08/21/2014 184 >182 11/27/2014 > 05/12/2015 177  > 05/11/2016   177 > 10/27/2016  175 > 11/09/2016 182 > 01/09/2017 198 > 02/20/2017  206 >  07/17/2017   209 >  09/03/2017   210 > 09/28/2017  206  > 11/26/2017   209 > 01/10/2018  210> 02/19/2018  206> 05/21/2018  198 > 08/06/2018  197 > 08/21/2018  196 >  11/22/2018  187    Mod bar***               CXR PA and Lateral:   08/18/2022 :    I personally reviewed images and impression is as follows:     Improved aeration vs priors      Assessment:

## 2023-03-12 ENCOUNTER — Ambulatory Visit: Payer: 59 | Admitting: Internal Medicine

## 2023-03-14 NOTE — Progress Notes (Deleted)
Subjective:    Patient ID: [name], female   DOB: 09/06/1961    MRN: [id number]  Brief patient profile:  62  yobf quit smoking 10/2004 dx by transbronchial biopsy 5/92 with NCG  consistent with sarcoid. Since that time she's been off and on prednisone multiple  times with flares of coughing dyspnea and skin involvement > weaned off chronic prednisone Feb 2012 and placed back on 10/10/16 for deteriorating obstructive changes on pfts  With resp failure/ 02 dep       History of Present Illness  02/20/2017  f/u ov/[name] re:   GOLD III/ pred 10 mg daily and bevespi 2bid  and alb rare  Chief Complaint  Patient presents with   Follow-up    6wk rov. pt states breathing is baseline. pt reports of sob with exertion & occ prod cough with green mucus mainly in the morning   Doe better :  [facility] = can't walk a nl pace on a flat grade s sob but does fine slow and flat eg shopping on  2lpm / ok at rest s 02  Uses 02 2lpm sleeping  rec Drop prednisone to 10 mg one half daily    11/26/2017  f/u ov/[name] re:  Sarcoid/skin involvement chronic cough ? Sinus dz/ using med calendar well / pednisone 10 mg daily  Chief Complaint  Patient presents with   Follow-up    Cough has improved some, but still producing some greenish yellow sputum first thing in the am.  She has not had to use her albuterol inhaler. She c/o shakiness in her hands for the past couple of months.    less cough p augmentin but still am sputum green / no noct symptoms Doing well with rehab but sometimes has to turn 02 up to 4 lpm there but doesn't do so in other settings and concerned about wt gain  rec Start plaquenil 200 mg daily  Ok to adjust the prednisone 5mg   from 1-2 pills in am as per med calendar See calendar for specific medication instructions     Admit date: 12/17/2018 Discharge date: 12/22/2018 DISCHARGE DIAGNOSES:  Multifocal pneumonia likely community-acquired Acute COPD exacerbation, improved Acute on chronic  respiratory failure with hypoxia History of sarcoidosis on chronic steroids Acute kidney injury, resolved Essential hypertension History of breast cancer     08/18/2022  f/u ov/[name] re: sarcoidosis/copd  maint on stiolto /pred 10  per day  Chief Complaint  Patient presents with   Follow-up    Follow up for cirrhosis. Pt states that the Stiolto inhaler is working well for her with no issues notes. PRN Albuterol.    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   Dyspnea:  *** Cough: *** Sleeping: *** SABA use: *** 02: *** Covid status:   *** Lung cancer screening :  ***    No obvious day to day  or daytime variability or assoc excess/ purulent sputum or mucus plugs or hemoptysis or cp or chest tightness, subjective wheeze or overt sinus or hb symptoms.   *** without nocturnal  or early am exacerbation  of respiratory  c/o's or need for noct saba. Also denies any obvious fluctuation of symptoms with weather or environmental changes or other aggravating or alleviating factors except as outlined above   No unusual exposure hx or h/o childhood pna/ asthma or knowledge of premature birth.  Current Allergies, Complete Past Medical History, Past Surgical History, Family History, and Social History were reviewed in [employer] record.  ROS  The following are not active complaints unless bolded Hoarseness, sore throat, dysphagia, dental problems, itching, sneezing,  nasal congestion or discharge of excess mucus or purulent secretions, ear  ache,   fever, chills, sweats, unintended wt loss or wt gain, classically pleuritic or exertional cp,  orthopnea pnd or arm/hand swelling  or leg swelling, presyncope, palpitations, abdominal pain, anorexia, nausea, vomiting, diarrhea  or change in bowel habits or change in bladder habits, change in stools or change in urine, dysuria, hematuria,  rash, arthralgias, visual complaints, headache, numbness, weakness or ataxia or problems with walking or coordination,  change in mood or  memory.        No outpatient medications have been marked as taking for the 03/15/23 encounter (Appointment) with Nyoka Cowden, MD.                  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 Marga Melnick  -h/o Daily prednisone since 2007   > off completely Feb 2012 with no problem> restarted  09/2016  ? 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 Some better but no worse off 04/2011 - Rehab started mid aug 2018  BACK PAIN, LOW (ICD-724.2)  ANEMIA, IRON DEFICIENCY, UNSPEC. (ICD-280.9)  Health Maintenance.......................................................   [facility]   Family History:  crohn`s in daughter, M-lupus, htn, asthma,  no Ca, DM, CAD    Social History:   lives with twin children and grandson. single; >20 pack year history, quit in 2005; no EtOH  Laid off  from works for [employer] Sept 2013           Objective:   Physical Exam  Wts  03/15/2023    ***   08/18/2022   187  03/14/2022     199  07/28/2021    205  08/26/2020  219  11/17/2019      223   12/30/2018  188  wt 166 Oct 12, 2009 >170 October 28, 2010 > 169 06/29/2011 >>171 08/24/2011 > 01/15/2012  171 > 04/24/2012 176 > 06/28/2012  175> 08/13/2012 > 08/30/2012  173 > 170 11/28/2012 > 01/29/2013  176 > 174 >173 06/16/2013 > 171 09/18/2013 > 12/19/2013 177 >179 04/13/2014>  04/23/2014 177 > 08/21/2014 184 >182 11/27/2014 > 05/12/2015 177  > 05/11/2016   177 > 10/27/2016  175 > 11/09/2016 182 > 01/09/2017 198 >  02/20/2017  206 >  07/17/2017   209 >  09/03/2017   210 > 09/28/2017  206  > 11/26/2017   209 > 01/10/2018  210> 02/19/2018  206> 05/21/2018  198 > 08/06/2018  197 > 08/21/2018  196 > 11/22/2018  187    Mod bar***               CXR PA and Lateral:   08/18/2022 :    I personally reviewed images and impression is as follows:     Improved aeration vs priors      Assessment:

## 2023-03-15 ENCOUNTER — Ambulatory Visit: Payer: 59 | Admitting: Internal Medicine

## 2023-03-17 ENCOUNTER — Other Ambulatory Visit: Payer: Self-pay | Admitting: Internal Medicine

## 2023-03-17 DIAGNOSIS — D869 Sarcoidosis, unspecified: Secondary | ICD-10-CM

## 2023-03-30 ENCOUNTER — Encounter: Payer: Self-pay | Admitting: Internal Medicine

## 2023-04-30 ENCOUNTER — Telehealth: Payer: Self-pay | Admitting: Internal Medicine

## 2023-04-30 ENCOUNTER — Ambulatory Visit: Payer: 59 | Admitting: Adult Health

## 2023-04-30 MED ORDER — PREDNISONE 10 MG PO TABS: ORAL_TABLET | ORAL | 2 refills | Status: DC

## 2023-04-30 NOTE — Telephone Encounter (Signed)
Spoke to pt and gave her Dr Thurston Hole recommendations. I also sent in Prednisone to [facility] for her. Pt verbalized understanding nothing further needed.

## 2023-04-30 NOTE — Telephone Encounter (Signed)
Patient states having symptoms of cough and mucus. Pharmacy is [facility]. Patient phone number is [phone].

## 2023-04-30 NOTE — Telephone Encounter (Signed)
Spoke with patient. She complains of productive cough with clear phlegm, wheezing, chest congestion. Symptoms started over a week ago. No fever Patient has taken Dayquil and Nyquil. Also using Flonase and inhalers   Pharmacy [facility] on [facility]  Dr. [name] please advise?

## 2023-04-30 NOTE — Telephone Encounter (Signed)
Prednisone 20 mg daily until better then taper back down as before - ok to refill the 10's if needed to keep her supplied until next ov

## 2023-06-11 ENCOUNTER — Ambulatory Visit: Payer: 59 | Admitting: Adult Health

## 2023-06-26 ENCOUNTER — Inpatient Hospital Stay (HOSPITAL_COMMUNITY)
Admission: EM | Admit: 2023-06-26 | Discharge: 2023-06-28 | DRG: 081 | Disposition: A | Discharge status: Home / Self Care | Payer: 59 | Attending: Internal Medicine | Admitting: Internal Medicine

## 2023-06-26 ENCOUNTER — Emergency Department (HOSPITAL_COMMUNITY): Payer: 59

## 2023-06-26 ENCOUNTER — Other Ambulatory Visit: Payer: Self-pay

## 2023-06-26 ENCOUNTER — Encounter (HOSPITAL_COMMUNITY): Payer: Self-pay

## 2023-06-26 DIAGNOSIS — Z923 Personal history of irradiation: Secondary | ICD-10-CM | POA: Diagnosis not present

## 2023-06-26 DIAGNOSIS — Z7982 Long term (current) use of aspirin: Secondary | ICD-10-CM | POA: Diagnosis not present

## 2023-06-26 DIAGNOSIS — Z7952 Long term (current) use of systemic steroids: Secondary | ICD-10-CM | POA: Diagnosis not present

## 2023-06-26 DIAGNOSIS — Z825 Family history of asthma and other chronic lower respiratory diseases: Secondary | ICD-10-CM

## 2023-06-26 DIAGNOSIS — Z66 Do not resuscitate: Secondary | ICD-10-CM | POA: Diagnosis present

## 2023-06-26 DIAGNOSIS — R202 Paresthesia of skin: Principal | ICD-10-CM

## 2023-06-26 DIAGNOSIS — Z9981 Dependence on supplemental oxygen: Secondary | ICD-10-CM | POA: Diagnosis not present

## 2023-06-26 DIAGNOSIS — Z8249 Family history of ischemic heart disease and other diseases of the circulatory system: Secondary | ICD-10-CM

## 2023-06-26 DIAGNOSIS — C7951 Secondary malignant neoplasm of bone: Secondary | ICD-10-CM | POA: Diagnosis present

## 2023-06-26 DIAGNOSIS — J449 Chronic obstructive pulmonary disease, unspecified: Secondary | ICD-10-CM

## 2023-06-26 DIAGNOSIS — C7931 Secondary malignant neoplasm of brain: Secondary | ICD-10-CM | POA: Diagnosis present

## 2023-06-26 DIAGNOSIS — Z853 Personal history of malignant neoplasm of breast: Secondary | ICD-10-CM | POA: Diagnosis not present

## 2023-06-26 DIAGNOSIS — D869 Sarcoidosis, unspecified: Secondary | ICD-10-CM | POA: Diagnosis present

## 2023-06-26 DIAGNOSIS — J439 Emphysema, unspecified: Secondary | ICD-10-CM | POA: Diagnosis present

## 2023-06-26 DIAGNOSIS — E669 Obesity, unspecified: Secondary | ICD-10-CM | POA: Diagnosis present

## 2023-06-26 DIAGNOSIS — Z6831 Body mass index (BMI) 31.0-31.9, adult: Secondary | ICD-10-CM

## 2023-06-26 DIAGNOSIS — I10 Essential (primary) hypertension: Secondary | ICD-10-CM | POA: Diagnosis present

## 2023-06-26 DIAGNOSIS — Z79899 Other long term (current) drug therapy: Secondary | ICD-10-CM

## 2023-06-26 DIAGNOSIS — G936 Cerebral edema: Principal | ICD-10-CM | POA: Diagnosis present

## 2023-06-26 DIAGNOSIS — J479 Bronchiectasis, uncomplicated: Secondary | ICD-10-CM | POA: Diagnosis present

## 2023-06-26 DIAGNOSIS — Z885 Allergy status to narcotic agent status: Secondary | ICD-10-CM

## 2023-06-26 DIAGNOSIS — D573 Sickle-cell trait: Secondary | ICD-10-CM | POA: Diagnosis present

## 2023-06-26 DIAGNOSIS — Z83438 Family history of other disorder of lipoprotein metabolism and other lipidemia: Secondary | ICD-10-CM

## 2023-06-26 DIAGNOSIS — J9611 Chronic respiratory failure with hypoxia: Secondary | ICD-10-CM | POA: Diagnosis not present

## 2023-06-26 DIAGNOSIS — Z87891 Personal history of nicotine dependence: Secondary | ICD-10-CM

## 2023-06-26 DIAGNOSIS — Z8269 Family history of other diseases of the musculoskeletal system and connective tissue: Secondary | ICD-10-CM

## 2023-06-26 DIAGNOSIS — G9389 Other specified disorders of brain: Secondary | ICD-10-CM

## 2023-06-26 DIAGNOSIS — R918 Other nonspecific abnormal finding of lung field: Secondary | ICD-10-CM | POA: Diagnosis not present

## 2023-06-26 DIAGNOSIS — F32A Depression, unspecified: Secondary | ICD-10-CM | POA: Diagnosis present

## 2023-06-26 DIAGNOSIS — C7801 Secondary malignant neoplasm of right lung: Secondary | ICD-10-CM | POA: Diagnosis present

## 2023-06-26 DIAGNOSIS — Z862 Personal history of diseases of the blood and blood-forming organs and certain disorders involving the immune mechanism: Secondary | ICD-10-CM

## 2023-06-26 DIAGNOSIS — J9612 Chronic respiratory failure with hypercapnia: Secondary | ICD-10-CM | POA: Diagnosis present

## 2023-06-26 DIAGNOSIS — Z803 Family history of malignant neoplasm of breast: Secondary | ICD-10-CM | POA: Diagnosis not present

## 2023-06-26 DIAGNOSIS — Z90710 Acquired absence of both cervix and uterus: Secondary | ICD-10-CM

## 2023-06-26 DIAGNOSIS — R29818 Other symptoms and signs involving the nervous system: Secondary | ICD-10-CM

## 2023-06-26 LAB — BASIC METABOLIC PANEL
Anion gap: 14 (ref 5–15)
BUN: 16 mg/dL (ref 8–23)
CO2: 31 mmol/L (ref 22–32)
Calcium: 9.5 mg/dL (ref 8.9–10.3)
Chloride: 92 mmol/L — ABNORMAL LOW (ref 98–111)
Creatinine, Ser: 1.24 mg/dL — ABNORMAL HIGH (ref 0.44–1.00)
GFR, Estimated: 49 mL/min — ABNORMAL LOW (ref >60)
Glucose, Bld: 113 mg/dL — ABNORMAL HIGH (ref 70–99)
Potassium: 4.3 mmol/L (ref 3.5–5.1)
Sodium: 137 mmol/L (ref 135–145)

## 2023-06-26 LAB — CBC
HCT: 31 % — ABNORMAL LOW (ref 36.0–46.0)
Hemoglobin: 9.6 g/dL — ABNORMAL LOW (ref 12.0–15.0)
MCH: 24 pg — ABNORMAL LOW (ref 26.0–34.0)
MCHC: 31 g/dL (ref 30.0–36.0)
MCV: 77.5 fL — ABNORMAL LOW (ref 80.0–100.0)
Platelets: 384 10*3/uL (ref 150–400)
RBC: 4 MIL/uL (ref 3.87–5.11)
RDW: 14.8 % (ref 11.5–15.5)
WBC: 12.2 10*3/uL — ABNORMAL HIGH (ref 4.0–10.5)
nRBC: 0 % (ref 0.0–0.2)

## 2023-06-26 MED ORDER — HYDROXYCHLOROQUINE SULFATE 200 MG PO TABS
200.0000 mg | ORAL_TABLET | Freq: Every day | ORAL | Status: DC
  Administered 2023-06-27 – 2023-06-28 (×2): 200 mg via ORAL
  Filled 2023-06-26 (×2): qty 1

## 2023-06-26 MED ORDER — ATORVASTATIN CALCIUM 20 MG PO TABS
20.0000 mg | ORAL_TABLET | Freq: Every day | ORAL | Status: DC
  Administered 2023-06-27 – 2023-06-28 (×2): 20 mg via ORAL
  Filled 2023-06-26 (×2): qty 1

## 2023-06-26 MED ORDER — ACETAMINOPHEN 650 MG RE SUPP: 650.0000 mg | Freq: Four times a day (QID) | RECTAL | Status: DC | PRN

## 2023-06-26 MED ORDER — DEXAMETHASONE SODIUM PHOSPHATE 4 MG/ML IJ SOLN
4.0000 mg | Freq: Once | INTRAMUSCULAR | Status: AC
  Administered 2023-06-26: 4 mg via INTRAVENOUS
  Filled 2023-06-26: qty 1

## 2023-06-26 MED ORDER — ASPIRIN 81 MG PO TBEC
81.0000 mg | DELAYED_RELEASE_TABLET | Freq: Every day | ORAL | Status: DC
  Administered 2023-06-27 – 2023-06-28 (×2): 81 mg via ORAL
  Filled 2023-06-26 (×2): qty 1

## 2023-06-26 MED ORDER — ONDANSETRON HCL 4 MG PO TABS: 4.0000 mg | ORAL_TABLET | Freq: Four times a day (QID) | ORAL | Status: DC | PRN

## 2023-06-26 MED ORDER — METOPROLOL TARTRATE 25 MG PO TABS
25.0000 mg | ORAL_TABLET | Freq: Every day | ORAL | Status: DC
  Administered 2023-06-27 – 2023-06-28 (×2): 25 mg via ORAL
  Filled 2023-06-26 (×2): qty 1

## 2023-06-26 MED ORDER — IOHEXOL 350 MG/ML SOLN
50.0000 mL | Freq: Once | INTRAVENOUS | Status: AC | PRN
  Administered 2023-06-26: 50 mL via INTRAVENOUS

## 2023-06-26 MED ORDER — MELATONIN 5 MG PO TABS: 10.0000 mg | ORAL_TABLET | Freq: Every evening | ORAL | Status: DC | PRN

## 2023-06-26 MED ORDER — PANTOPRAZOLE SODIUM 40 MG PO TBEC
40.0000 mg | DELAYED_RELEASE_TABLET | Freq: Every day | ORAL | Status: DC
  Administered 2023-06-27 – 2023-06-28 (×2): 40 mg via ORAL
  Filled 2023-06-26 (×2): qty 1

## 2023-06-26 MED ORDER — UMECLIDINIUM BROMIDE 62.5 MCG/ACT IN AEPB
1.0000 | INHALATION_SPRAY | Freq: Every day | RESPIRATORY_TRACT | Status: DC
  Administered 2023-06-27 – 2023-06-28 (×2): 1 via RESPIRATORY_TRACT
  Filled 2023-06-26: qty 7

## 2023-06-26 MED ORDER — ACETAMINOPHEN 325 MG PO TABS: 650.0000 mg | ORAL_TABLET | Freq: Four times a day (QID) | ORAL | Status: DC | PRN

## 2023-06-26 MED ORDER — IPRATROPIUM-ALBUTEROL 0.5-2.5 (3) MG/3ML IN SOLN: 3.0000 mL | RESPIRATORY_TRACT | Status: DC | PRN

## 2023-06-26 MED ORDER — ARFORMOTEROL TARTRATE 15 MCG/2ML IN NEBU
15.0000 ug | INHALATION_SOLUTION | Freq: Two times a day (BID) | RESPIRATORY_TRACT | Status: DC
  Administered 2023-06-27 – 2023-06-28 (×3): 15 ug via RESPIRATORY_TRACT
  Filled 2023-06-26 (×3): qty 2

## 2023-06-26 MED ORDER — UMECLIDINIUM BROMIDE 62.5 MCG/ACT IN AEPB
1.0000 | INHALATION_SPRAY | Freq: Every day | RESPIRATORY_TRACT | Status: DC
  Filled 2023-06-26: qty 7

## 2023-06-26 MED ORDER — ONDANSETRON HCL 4 MG/2ML IJ SOLN: 4.0000 mg | Freq: Four times a day (QID) | INTRAMUSCULAR | Status: DC | PRN

## 2023-06-26 MED ORDER — ARFORMOTEROL TARTRATE 15 MCG/2ML IN NEBU: 15.0000 ug | INHALATION_SOLUTION | Freq: Two times a day (BID) | RESPIRATORY_TRACT | Status: DC

## 2023-06-26 MED ORDER — DEXAMETHASONE SODIUM PHOSPHATE 4 MG/ML IJ SOLN
4.0000 mg | Freq: Four times a day (QID) | INTRAMUSCULAR | Status: DC
  Administered 2023-06-27 – 2023-06-28 (×5): 4 mg via INTRAVENOUS
  Filled 2023-06-26 (×5): qty 1

## 2023-06-26 MED ORDER — TRIAMTERENE-HCTZ 37.5-25 MG PO TABS
1.0000 | ORAL_TABLET | Freq: Every day | ORAL | Status: DC
  Administered 2023-06-27 – 2023-06-28 (×2): 1 via ORAL
  Filled 2023-06-26 (×2): qty 1

## 2023-06-26 MED ORDER — SERTRALINE HCL 50 MG PO TABS
50.0000 mg | ORAL_TABLET | Freq: Every day | ORAL | Status: DC
  Administered 2023-06-27 – 2023-06-28 (×2): 50 mg via ORAL
  Filled 2023-06-26 (×2): qty 1

## 2023-06-26 NOTE — Assessment & Plan Note (Signed)
Continue with supplemental O2 

## 2023-06-26 NOTE — Assessment & Plan Note (Signed)
Chronic.  Not exacerbated.

## 2023-06-26 NOTE — Consult Note (Signed)
Reason for Consult: Bradycardia Referring Physician: Emergency department  [name] is an 62 y.o. female.  HPI: 62 year old female presents with left upper extremity numbness and some intermittent poor motor control.  Symptoms began over the past few days.  No precipitating event.  Patient denies headache.  She reports poor appetite with unexplained 25 pound weight loss over the past few months.  She has a remote history of breast cancer without any known metastatic disease.  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    Overweight (BMI 25.0-29.9) 05/11/2016   Palpitations    Personal history of radiation therapy 2016   Sarcoidosis STABLE PER CXR JUNE 2013   Shortness of breath    Sickle cell trait (HCC)     Past Surgical History:  Procedure Laterality Date   ADJUSTABLE SUTURE MANIPULATION  05/22/2012   Procedure: ADJUSTABLE SUTURE MANIPULATION;  Surgeon: Corinda Gubler, MD;  Location: [facility];  Service: Ophthalmology;  Laterality: Right;   BREAST LUMPECTOMY Right 2016   CESAREAN SECTION  1986   W/ BILATERAL TUBAL LIGATION   COLONOSCOPY WITH PROPOFOL N/A 02/21/2019   Procedure: COLONOSCOPY WITH PROPOFOL;  Surgeon: Hilarie Fredrickson, MD;  Location: [facility];  Service: Endoscopy;  Laterality: N/A;   EYE SURGERY     MEDIAN RECTUS REPAIR  05/22/2012   Procedure: MEDIAN RECTUS REPAIR;  Surgeon: Corinda Gubler, MD;  Location: [facility];  Service: Ophthalmology;  Laterality: Bilateral;  INFERIOR  RECTUS RESECTION WITH ADJUSTIBLE SUTURES RIGHT EYE    POLYPECTOMY  02/21/2019   Procedure: POLYPECTOMY;  Surgeon: Hilarie Fredrickson, MD;  Location: [facility];  Service: Endoscopy;;   RADIOACTIVE SEED GUIDED PARTIAL MASTECTOMY WITH AXILLARY SENTINEL LYMPH NODE BIOPSY Right 08/18/2015   Procedure: RADIOACTIVE SEED GUIDED PARTIAL MASTECTOMY WITH AXILLARY SENTINEL LYMPH NODE BIOPSY;  Surgeon: Almond Lint, MD;  Location: [facility];  Service: General;  Laterality: Right;   TOTAL ROBOTIC ASSISTED LAPAROSCOPIC HYSTERECTOMY  12-30-2010   SYMPTOMATIC UTERINE FIBROIDS   UPPER TEETH EXTRACTION'S  1992    Family History  Problem Relation Age of Onset   Hyperlipidemia Mother    Asthma Mother    Lupus Mother    Breast cancer Mother 28   Hypertension Father    Hyperlipidemia Father    Hypertension Sister    Hypertension Brother    Cancer Neg Hx    Diabetes Neg Hx    Coronary artery disease Neg Hx     Social History:  reports that she quit smoking about 19 years ago. Her smoking use included cigarettes. She started smoking about 39 years ago. She has a 20 pack-year smoking history. She has never used smokeless tobacco. She reports that she does not drink alcohol and does not use drugs.  Allergies:  Allergies  Allergen Reactions   Codeine Other (See Comments)    Avoids-felt bad when took before Pt claims she can have this but does not like  to take.    Medications: I have reviewed the patient's current medications.  Results for orders placed or performed during the hospital encounter of 06/26/23 (from the past 48 hour(s))  Basic metabolic panel     Status: Abnormal   Collection Time: 06/26/23  4:29 PM  Result Value Ref Range   Sodium 137 135 - 145 mmol/L   Potassium 4.3 3.5 - 5.1 mmol/L   Chloride 92 (L) 98 - 111 mmol/L   CO2 31 22 - 32 mmol/L   Glucose, Bld 113 (H) 70 - 99 mg/dL    Comment: Glucose reference range applies only to samples taken after fasting for at least 8  hours.   BUN 16 8 - 23 mg/dL   Creatinine, Ser 9.60 (H) 0.44 - 1.00 mg/dL   Calcium 9.5 8.9 - 45.4 mg/dL   GFR, Estimated 49 (L) >60 mL/min    Comment: (NOTE) Calculated using the CKD-EPI Creatinine Equation (2021)    Anion gap 14 5 - 15    Comment: Performed at [facility], [address] [place], [place] [address]  CBC     Status: Abnormal   Collection Time: 06/26/23  4:29 PM  Result Value Ref Range   WBC 12.2 (H) 4.0 - 10.5 K/uL   RBC 4.00 3.87 - 5.11 MIL/uL   Hemoglobin 9.6 (L) 12.0 - 15.0 g/dL   HCT 91.4 (L) 78.2 - 95.6 %   MCV 77.5 (L) 80.0 - 100.0 fL   MCH 24.0 (L) 26.0 - 34.0 pg   MCHC 31.0 30.0 - 36.0 g/dL   RDW 21.3 08.6 - 57.8 %   Platelets 384 150 - 400 K/uL   nRBC 0.0 0.0 - 0.2 %    Comment: Performed at [facility], [address] [place], [place] [address]    MR BRAIN WO CONTRAST  Result Date: 06/26/2023 CLINICAL DATA:  Initial evaluation for neuro deficit, stroke suspected. Left-sided numbness and tingling. EXAM: MRI HEAD WITHOUT CONTRAST TECHNIQUE: Multiplanar, multiecho pulse sequences of the brain and surrounding structures were obtained without intravenous contrast. COMPARISON:  Prior study from 06/12/2014. FINDINGS: Brain: Mild age-related cerebral atrophy. Mild chronic microvascular ischemic disease for age. No evidence for acute or subacute infarct. No areas of chronic cortical infarction. 1 cm round cystic mass seen involving the right parietal lobe, postcentral gyrus (series 13, image 21). Small focus of associated susceptibility artifact could reflect blood products and/or necrosis. Surrounding T2/FLAIR signal intensity consistent with vasogenic edema. No significant regional mass effect or midline shift. No other visible mass lesion. No hydrocephalus. No extra-axial fluid collection. Pituitary gland suprasellar region within normal limits. Vascular: Major intracranial vascular flow voids are maintained. Skull and upper cervical spine:  Craniocervical junction within normal limits. Bone marrow signal intensity normal. No scalp soft tissue abnormality. Sinuses/Orbits: Prior bilateral ocular lens replacement. Right gaze noted. Paranasal sinuses are clear. No significant mastoid effusion. Other: None. IMPRESSION: 1. 1 cm cystic mass involving the right parietal lobe, postcentral gyrus. Surrounding vasogenic edema without midline shift. Finding could reflect a solitary intracranial metastasis versus a primary CNS neoplasm. Follow-up examination with postcontrast MRI of the brain recommended for complete evaluation. 2. Underlying cerebral atrophy with mild chronic microvascular ischemic disease. Electronically Signed   By: Rise Mu M.D.   On: 06/26/2023 18:05    Pertinent items noted in HPI and remainder of comprehensive ROS otherwise negative. Blood pressure 117/67, pulse 60, temperature 98.1 F (36.7 C), temperature source Oral, resp. rate 18, height  5\' 5"  (1.651 m), weight 84.8 kg, SpO2 100%. Patient is awake and alert.  He is oriented and appropriate.  Speech is fluent.  Her judgment and insight are intact.  Cranial nerve function is normal bilaterally.  Motor examination reveals 5/5 motor strength in both upper and lower extremities without evidence of pronator drift.  Patient with diminished sensation to pinprick and light touch in her left upper extremity.  Otherwise sensory exam normal.  Reflexes normal.  Examination head ears eyes nose throat sore.  Chest and abdomen benign.  Extremities are free of major deformity.  Assessment/Plan: Patient with a subcortical mass in her right parietal region within the center of her postcentral gyrus with some surrounding edema.  Lesion most consistent with metastatic disease and imaging not particularly consistent with breast carcinoma.  I recommend admission to hospital for metastatic workup particularly concerned the patient may have unrecognized lung carcinoma.  Hopefully tissue can be  obtained to establish a primary diagnosis as this brain tumor is in a eloquent region of the brain and I would not recommend biopsy/resection but rather to treat this with SRS at a later time.  Recommend starting patient on IV Decadron 4 mg every 6 IV with p.o.  Sherilyn Cooter A  06/26/2023, 7:08 PM

## 2023-06-26 NOTE — ED Provider Notes (Signed)
[place] EMERGENCY DEPARTMENT AT [facility] Provider Note   CSN: [id number] Arrival date & time: 06/26/23  1604     History  Chief Complaint  Patient presents with   Neurologic Problem    [name] is a 62 y.o. female.  Pt with c/o left arm numbness/tingling in past day. Denies hx same. No neck pain or radicular pain. No headache. No chest pain or discomfort. No sob or unusual doe. No other numbness or weakness or loss of or change in normal functional ability. No change in speech or vision. No problems w balance or coordination. No change in meds. No recent other acute physical illness. No nvd.  No focal wrist, elbow or shoulder pain. No LUE swelling.   The history is provided by the patient, medical records and the EMS personnel.  Neurologic Problem Pertinent negatives include no chest pain, no abdominal pain, no headaches and no shortness of breath.       Home Medications Prior to Admission medications   Medication Sig Start Date End Date Taking? Authorizing Provider  acetaminophen (TYLENOL) 325 MG tablet Take 650 mg by mouth every 6 (six) hours as needed for mild pain or moderate pain.    [provider]  albuterol (PROVENTIL) (2.5 MG/3ML) 0.083% nebulizer solution Take 3 mLs (2.5 mg total) by nebulization every 6 (six) hours as needed for wheezing or shortness of breath. 12/22/18   Osvaldo Shipper, MD  albuterol (VENTOLIN HFA) 108 (90 Base) MCG/ACT inhaler INHALE 2 PUFFS EVERY 6 HOURS AS NEEDED FOR WHEEZING OR SHORTNESS OF BREATH Patient taking differently: Inhale 2 puffs into the lungs every 6 (six) hours as needed for shortness of breath or wheezing. 09/23/19   Nyoka Cowden, MD  aspirin EC 81 MG tablet Take 1 tablet (81 mg total) by mouth daily. Start 02/23/19 02/21/19   Pokhrel, Rebekah Chesterfield, MD  atorvastatin (LIPITOR) 20 MG tablet TAKE 1 TABLET (20 MG TOTAL) BY MOUTH DAILY. 03/28/21   Sandre Kitty, MD  ferrous sulfate 325 (65 FE) MG tablet Take 1  tablet (325 mg total) by mouth daily with breakfast. Patient taking differently: Take 325 mg by mouth every other day. 02/21/19   Pokhrel, Laxman, MD  fluticasone (FLONASE) 50 MCG/ACT nasal spray USE 2 SPRAYS IN BOTH  NOSTRILS DAILY AS NEEDED  FOR ALLERGIES Patient taking differently: Place 2 sprays into both nostrils daily as needed for allergies. 08/15/21   Nyoka Cowden, MD  gabapentin (NEURONTIN) 300 MG capsule TAKE 1 CAPSULE BY MOUTH IN THE  MORNING AND 2 CAPSULES BY MOUTH  IN THE EVENING 11/17/22   Serena Croissant, MD  hydroxychloroquine (PLAQUENIL) 200 MG tablet TAKE 1 TABLET BY MOUTH DAILY 03/19/23   Nyoka Cowden, MD  metoprolol tartrate (LOPRESSOR) 25 MG tablet Take 1 tablet (25 mg total) by mouth 2 (two) times daily. Patient taking differently: Take 25 mg by mouth daily. 03/09/21   Sandre Kitty, MD  montelukast (SINGULAIR) 10 MG tablet Take 10 mg by mouth at bedtime. 07/31/22   [provider]  Multiple Vitamin (MULTIVITAMIN) capsule Take 1 capsule by mouth daily.    [provider]  omeprazole (PRILOSEC) 20 MG capsule TAKE 1 CAPSULE EVERY DAY Patient taking differently: Take 20 mg by mouth daily. 01/03/21   Sandre Kitty, MD  OXYGEN 2lpm with rest and 3lpm with exertion    [provider]  predniSONE (DELTASONE) 10 MG tablet 2 daily x 2 weeks 03/14/22   Sandrea Hughs  B, MD  predniSONE (DELTASONE) 10 MG tablet INCREASE TO 20 MG UNTIL BETTER THEN WORK BACK TO PREVIOUS DOSE OF 5 MG DAILY 04/30/23   Nyoka Cowden, MD  predniSONE (DELTASONE) 5 MG tablet 5 mg take 2 until better then gradually taper to one half daily 10/19/21   Nyoka Cowden, MD  predniSONE (DELTASONE) 5 MG tablet After completing 20mg  until better, work back to previous dose of 5 mg daily 11/09/22   Nyoka Cowden, MD  sertraline (ZOLOFT) 50 MG tablet TAKE 1 TABLET EVERY DAY Patient taking differently: Take 50 mg by mouth daily. 09/11/20   Sandre Kitty, MD  STIOLTO RESPIMAT 2.5-2.5 MCG/ACT AERS  USE 2 INHALATIONS BY MOUTH DAILY 11/02/22   Nyoka Cowden, MD  triamterene-hydrochlorothiazide (MAXZIDE-25) 37.5-25 MG tablet TAKE 1 TABLET EVERY DAY Patient taking differently: Take 1 tablet by mouth daily. TAKE 1 TABLET EVERY DAY 06/02/21   Nyoka Cowden, MD      Allergies    Codeine    Review of Systems   Review of Systems  Constitutional:  Negative for chills and fever.  HENT:  Negative for sore throat.   Eyes:  Negative for visual disturbance.  Respiratory:  Negative for shortness of breath.   Cardiovascular:  Negative for chest pain.  Gastrointestinal:  Negative for abdominal pain, diarrhea and vomiting.  Genitourinary:  Negative for dysuria and flank pain.  Musculoskeletal:  Negative for back pain and neck pain.  Skin:  Negative for rash.  Neurological:  Positive for numbness. Negative for speech difficulty, weakness and headaches.  Hematological:  Does not bruise/bleed easily.  Psychiatric/Behavioral:  Negative for confusion.     Physical Exam Updated Vital Signs BP 117/67   Pulse 60   Temp 98.1 F (36.7 C) (Oral)   Resp 18   Ht 1.651 m (5\' 5" )   Wt 84.8 kg   SpO2 100%   BMI 31.11 kg/m  Physical Exam Vitals and nursing note reviewed.  Constitutional:      Appearance: Normal appearance. She is well-developed.  HENT:     Head: Atraumatic.     Nose: Nose normal.     Mouth/Throat:     Mouth: Mucous membranes are moist.  Eyes:     General: No scleral icterus.    Extraocular Movements: Extraocular movements intact.     Conjunctiva/sclera: Conjunctivae normal.     Pupils: Pupils are equal, round, and reactive to light.  Neck:     Vascular: No carotid bruit.     Trachea: No tracheal deviation.     Comments: Normal rom without pain.  Cardiovascular:     Rate and Rhythm: Normal rate and regular rhythm.     Pulses: Normal pulses.     Heart sounds: Normal heart sounds. No murmur heard.    No friction rub. No gallop.  Pulmonary:     Effort: Pulmonary effort  is normal. No respiratory distress.     Breath sounds: Normal breath sounds.  Abdominal:     General: There is no distension.     Palpations: Abdomen is soft.     Tenderness: There is no abdominal tenderness.  Musculoskeletal:        General: No swelling or tenderness.     Cervical back: Normal range of motion and neck supple. No rigidity or tenderness. No muscular tenderness.     Comments: C spine non tender, aligned, no step off. Normal rom. Good rom at left shoulder elbow and wrist without pain.  LUE of normal color and warmth. Radial pulse palp. No LUE edema.   Skin:    General: Skin is warm and dry.     Findings: No rash.  Neurological:     Mental Status: She is alert.     Comments: Alert, speech normal. Motor/sens grossly intact bil. Stre 5/5. No pronator drift. Sens grossly intact. Steady gait.   Psychiatric:        Mood and Affect: Mood normal.     ED Results / Procedures / Treatments   Labs (all labs ordered are listed, but only abnormal results are displayed) Results for orders placed or performed during the hospital encounter of 06/26/23  Basic metabolic panel  Result Value Ref Range   Sodium 137 135 - 145 mmol/L   Potassium 4.3 3.5 - 5.1 mmol/L   Chloride 92 (L) 98 - 111 mmol/L   CO2 31 22 - 32 mmol/L   Glucose, Bld 113 (H) 70 - 99 mg/dL   BUN 16 8 - 23 mg/dL   Creatinine, Ser 1.61 (H) 0.44 - 1.00 mg/dL   Calcium 9.5 8.9 - 09.6 mg/dL   GFR, Estimated 49 (L) >60 mL/min   Anion gap 14 5 - 15  CBC  Result Value Ref Range   WBC 12.2 (H) 4.0 - 10.5 K/uL   RBC 4.00 3.87 - 5.11 MIL/uL   Hemoglobin 9.6 (L) 12.0 - 15.0 g/dL   HCT 04.5 (L) 40.9 - 81.1 %   MCV 77.5 (L) 80.0 - 100.0 fL   MCH 24.0 (L) 26.0 - 34.0 pg   MCHC 31.0 30.0 - 36.0 g/dL   RDW 91.4 78.2 - 95.6 %   Platelets 384 150 - 400 K/uL   nRBC 0.0 0.0 - 0.2 %     EKG EKG Interpretation Date/Time:  Tuesday June 26 2023 16:25:59 EDT Ventricular Rate:  70 PR Interval:  159 QRS Duration:  174 QT  Interval:  434 QTC Calculation: 469 R Axis:   64  Text Interpretation: Sinus rhythm Nonspecific intraventricular conduction delay Non-specific ST-t changes No significant change since last tracing Confirmed by Cathren Laine ([id number]) on 06/26/2023 6:06:40 PM  Radiology MR BRAIN WO CONTRAST  Result Date: 06/26/2023 CLINICAL DATA:  Initial evaluation for neuro deficit, stroke suspected. Left-sided numbness and tingling. EXAM: MRI HEAD WITHOUT CONTRAST TECHNIQUE: Multiplanar, multiecho pulse sequences of the brain and surrounding structures were obtained without intravenous contrast. COMPARISON:  Prior study from 06/12/2014. FINDINGS: Brain: Mild age-related cerebral atrophy. Mild chronic microvascular ischemic disease for age. No evidence for acute or subacute infarct. No areas of chronic cortical infarction. 1 cm round cystic mass seen involving the right parietal lobe, postcentral gyrus (series 13, image 21). Small focus of associated susceptibility artifact could reflect blood products and/or necrosis. Surrounding T2/FLAIR signal intensity consistent with vasogenic edema. No significant regional mass effect or midline shift. No other visible mass lesion. No hydrocephalus. No extra-axial fluid collection. Pituitary gland suprasellar region within normal limits. Vascular: Major intracranial vascular flow voids are maintained. Skull and upper cervical spine: Craniocervical junction within normal limits. Bone marrow signal intensity normal. No scalp soft tissue abnormality. Sinuses/Orbits: Prior bilateral ocular lens replacement. Right gaze noted. Paranasal sinuses are clear. No significant mastoid effusion. Other: None. IMPRESSION: 1. 1 cm cystic mass involving the right parietal lobe, postcentral gyrus. Surrounding vasogenic edema without midline shift. Finding could reflect a solitary intracranial metastasis versus a primary CNS neoplasm. Follow-up examination with postcontrast MRI of the brain recommended for  complete evaluation. 2.  Underlying cerebral atrophy with mild chronic microvascular ischemic disease. Electronically Signed   By: Rise Mu M.D.   On: 06/26/2023 18:05    Procedures Procedures    Medications Ordered in ED Medications - No data to display  ED Course/ Medical Decision Making/ A&P                                 Medical Decision Making Problems Addressed: Acute focal neurological deficit: acute illness or injury with systemic symptoms that poses a threat to life or bodily functions Arm paresthesia, left: acute illness or injury with systemic symptoms History of breast cancer: chronic illness or injury that poses a threat to life or bodily functions History of sarcoidosis: chronic illness or injury that poses a threat to life or bodily functions Mass of right lung: chronic illness or injury with exacerbation, progression, or side effects of treatment that poses a threat to life or bodily functions    Details: Acute on chronic, enlarging.  Right parietal lobe mass: acute illness or injury with systemic symptoms that poses a threat to life or bodily functions    Details: Metastatic cancer suspected, w surrounding edema.   Amount and/or Complexity of Data Reviewed Independent Historian: EMS    Details: hx External Data Reviewed: radiology and notes. Labs: ordered. Decision-making details documented in ED Course. Radiology: ordered and independent interpretation performed. Decision-making details documented in ED Course. ECG/medicine tests: ordered and independent interpretation performed. Decision-making details documented in ED Course. Discussion of management or test interpretation with external provider(s): neurosurgery  Risk Prescription drug management. Decision regarding hospitalization.   Iv ns. Continuous pulse ox and cardiac monitoring. Labs ordered/sent. Imaging ordered.   Differential diagnosis includes cva, paresthesia, nerve impingement, etc.  Dispo decision including potential need for admission considered - will get labs and imaging and reassess.   Reviewed nursing notes and prior charts for additional history. External reports reviewed. Additional history from: EMS.  Cardiac monitor: sinus rhythm, rate 77.  Labs reviewed/interpreted by me - chem normal.   MRI reviewed/interpreted by me - no acute cva. +right brain mass ?met.   Of note, pt with remote hx breast cancer 7-8 yrs ago. In 01/2022, pt did have 2 cm right lung mass  - pt indicates was told that it was her sarcoid, but no biopsy done and no repeat CT imaging done.   Neurosurgery consulted. Discussed with Dr Jordan Likes who reviewed MRI - he will see in ED - indicates start decadron 4 mg iv q 6 and admit to medicine for workup for primary, and they will follow and make further recommendations.   Unassigned medicine consulted for admission.   CT reviewed/interpreted by me - enlarging right lung mass worrisome for ca.   CRITICAL CARE RE: acute neurologic symptoms with new right parietal brain mass/met with edema, requiring parenteral steroid therapy. Admission.  Performed by: Suzi Roots Total critical care time: 40 minutes Critical care time was exclusive of separately billable procedures and treating other patients. Critical care was necessary to treat or prevent imminent or life-threatening deterioration. Critical care was time spent personally by me on the following activities: development of treatment plan with patient and/or surrogate as well as nursing, discussions with consultants, evaluation of patient's response to treatment, examination of patient, obtaining history from patient or surrogate, ordering and performing treatments and interventions, ordering and review of laboratory studies, ordering and review of radiographic studies, pulse oximetry  and re-evaluation of patient's condition.          Final Clinical Impression(s) / ED Diagnoses Final diagnoses:   None    Rx / DC Orders ED Discharge Orders     None         Cathren Laine, MD 06/26/23 2043

## 2023-06-26 NOTE — ED Notes (Signed)
Pt gone to CT 

## 2023-06-26 NOTE — Assessment & Plan Note (Signed)
Chronic. 

## 2023-06-26 NOTE — Assessment & Plan Note (Addendum)
Has lung mass seen on CT in 01-2022. Seen in pulmonary office in 03-2022. F/u cxr in 08-2022 did not show any mass. May need lung biopsy. Keep NPO after MN. Hold on heparin/lovenox.

## 2023-06-26 NOTE — Assessment & Plan Note (Addendum)
Admit to [facility]. Oncology to see in the AM. Continue with IV decadron per neurosurgery. May need lung biopsy. Keep NPO after MN. Hold on heparin/lovenox. May need radiation oncology consult.

## 2023-06-26 NOTE — Subjective & Objective (Signed)
CC: left arm numbness HPI: 62 year old African-American female history of breast cancer status postlumpectomy status post XRT, history of COPD, chronic hypoxic respiratory failure on home oxygen, obesity, history of sarcoid, hypertension, presents to the ER today with an overnight history of left arm numbness and paresthesias.  She states that she woke up last night with her arm tingling having pins and needle sensation.  Denies any weakness.  She is describes some spasms in her hand.  Denied any facial drooping.  No trouble speaking.  Did not have any trouble walking.  Came to the ER for evaluation.  Patient with a long history of sarcoidosis.  She has multiple pulmonary reticular nodules.  In March 2023, she presented to the ER with palpitations.  She had a CT scan performed of her chest which showed a 1.8 x 2.2 spiculated mass in the right upper lobe lobe.  Also noted during that scan was prominent emphysematous changes with scattered fibrosis.  Patient followed up with pulmonology in May 2023.  Her CT scan from March 2023 was noted.  At that time, pulmonology's plan was to have the patient follow-up in 3 months.  She was to continue taking 20 mg of prednisone daily for her sarcoid.  Pulmonology noted that she was at low risk but not without "no risk".  Follow-up chest x-ray was ordered.  Patient did have a Chest x-ray performed in December 2023 ordered by Dr. [name].  The previously seen spiculated right apical nodule was not seen on chest x-ray in October.  On arrival to ER, temp 98.1 heart rate 77 blood pressure 123/72 satting 96% on 2 L.  White count 12.2, hemoglobin 9.6, platelets 384  Sodium 137, potassium 4.3, bicarb 31, BUN 16, creatinine 1.24  MRI of the brain showed a 1 cm cystic mass involving the right parietal lobe and postcentral gyrus.  There is vasogenic edema without midline shift.  CT chest with IV contrast was and showed a 6.4 x 4.9 cm lobulated right apical mass there is also  some suspicious lesion in T3 suspicious for osteo metastatic disease.  Neurosurgery was consulted.  Patient seen by Dr. [name].  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  Patient has been started on Decadron.  [facility] hospital[facility] consulted.

## 2023-06-26 NOTE — ED Triage Notes (Signed)
Intermittent numbness and tingling to left arm since 8pm last night. Pt has and episode today of about an hour. EMS reports symptoms resolved when they arrived on scene. No hx of stroke.   EMS VS: 112/88 76 22 RR  98% RA

## 2023-06-26 NOTE — Assessment & Plan Note (Signed)
Stable. BMI 31.1

## 2023-06-26 NOTE — Assessment & Plan Note (Signed)
Verified with pt that she wants to be a DNR/DNI. This was witnessed by her dtr Tomekia and  her grand-dtr at bedside.

## 2023-06-26 NOTE — H&P (Signed)
History and Physical    [name] MWU:[id number] DOB: [birth year] DOA: 06/26/2023  DOS: the patient was seen and examined on 06/26/2023  PCP: [name]., FNP   Patient coming from: Home  I have personally briefly reviewed patient's old medical records in [facility]  CC: left arm numbness HPI: 62 year old African-American female history of breast cancer status postlumpectomy status post XRT, history of COPD, chronic hypoxic respiratory failure on home oxygen, obesity, history of sarcoid, hypertension, presents to the ER today with an overnight history of left arm numbness and paresthesias.  She states that she woke up last night with her arm tingling having pins and needle sensation.  Denies any weakness.  She is describes some spasms in her hand.  Denied any facial drooping.  No trouble speaking.  Did not have any trouble walking.  Came to the ER for evaluation.  Patient with a long history of sarcoidosis.  She has multiple pulmonary reticular nodules.  In March 2023, she presented to the ER with palpitations.  She had a CT scan performed of her chest which showed a 1.8 x 2.2 spiculated mass in the right upper lobe lobe.  Also noted during that scan was prominent emphysematous changes with scattered fibrosis.  Patient followed up with pulmonology in May 2023.  Her CT scan from March 2023 was noted.  At that time, pulmonology's plan was to have the patient follow-up in 3 months.  She was to continue taking 20 mg of prednisone daily for her sarcoid.  Pulmonology noted that she was at low risk but not without "no risk".  Follow-up chest x-ray was ordered.  Patient did have a Chest x-ray performed in December 2023 ordered by Dr. [name].  The previously seen spiculated right apical nodule was not seen on chest x-ray in October.  On arrival to ER, temp 98.1 heart rate 77 blood pressure 123/72 satting 96% on 2 L.  White count 12.2, hemoglobin 9.6, platelets 384  Sodium 137,  potassium 4.3, bicarb 31, BUN 16, creatinine 1.24  MRI of the brain showed a 1 cm cystic mass involving the right parietal lobe and postcentral gyrus.  There is vasogenic edema without midline shift.  CT chest with IV contrast was and showed a 6.4 x 4.9 cm lobulated right apical mass there is also some suspicious lesion in T3 suspicious for osteo metastatic disease.  Neurosurgery was consulted.  Patient seen by Dr. [name].  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  Patient has been started on Decadron.  [facility] hospitalist consulted.    ED Course: MRI brain shows frontal mass with edema. CT chest shows RUL mass. Neurosurgery consulted.  Review of Systems:  Review of Systems  Constitutional: Negative.   HENT: Negative.    Eyes: Negative.   Respiratory: Negative.    Cardiovascular: Negative.   Gastrointestinal: Negative.   Genitourinary: Negative.   Musculoskeletal: Negative.   Skin: Negative.   Neurological:  Positive for sensory change.       Paresthesias with numbness and tingling in the left upper extremity from her fingertips to her shoulder.  Endo/Heme/Allergies: Negative.   Psychiatric/Behavioral: Negative.    All other systems reviewed and are negative.   Past Medical History:  Diagnosis Date   Acute on chronic respiratory failure (HCC) 10/09/2016   Allergic rhinitis    Breast cancer (HCC) 2016   right  breast   COPD, mild (HCC) FOLLOWED BY DR IHKV   GERD (gastroesophageal reflux disease)    HTN (hypertension)    Hypertension    Iron deficiency anemia    Long-term current use of steroids SYMBICORT INHALER   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    Overweight (BMI 25.0-29.9) 05/11/2016   Palpitations    Personal history of radiation therapy 2016   Sarcoidosis STABLE PER CXR JUNE 2013   Shortness of breath    Sickle cell trait (HCC)     Past Surgical History:  Procedure  Laterality Date   ADJUSTABLE SUTURE MANIPULATION  05/22/2012   Procedure: ADJUSTABLE SUTURE MANIPULATION;  Surgeon: Corinda Gubler, MD;  [facility];  Service: Ophthalmology;  Laterality: Right;   BREAST LUMPECTOMY Right 2016   CESAREAN SECTION  1986   W/ BILATERAL TUBAL LIGATION   COLONOSCOPY WITH PROPOFOL N/A 02/21/2019   Procedure: COLONOSCOPY WITH PROPOFOL;  Surgeon: Hilarie Fredrickson, MD;  Location: [facility];  Service: Endoscopy;  Laterality: N/A;   EYE SURGERY     MEDIAN RECTUS REPAIR  05/22/2012   Procedure: MEDIAN RECTUS REPAIR;  Surgeon: Corinda Gubler, MD;  Location: [facility];  Service: Ophthalmology;  Laterality: Bilateral;  INFERIOR RECTUS RESECTION WITH ADJUSTIBLE SUTURES RIGHT EYE    POLYPECTOMY  02/21/2019   Procedure: POLYPECTOMY;  Surgeon: Hilarie Fredrickson, MD;  Location: [facility];  Service: Endoscopy;;   RADIOACTIVE SEED GUIDED PARTIAL MASTECTOMY WITH AXILLARY SENTINEL LYMPH NODE BIOPSY Right 08/18/2015   Procedure: RADIOACTIVE SEED GUIDED PARTIAL MASTECTOMY WITH AXILLARY SENTINEL LYMPH NODE BIOPSY;  Surgeon: Almond Lint, MD;  Location: [facility];  Service: General;  Laterality: Right;   TOTAL ROBOTIC ASSISTED LAPAROSCOPIC HYSTERECTOMY  12-30-2010   SYMPTOMATIC UTERINE FIBROIDS   UPPER TEETH EXTRACTION'S  1992     reports that she quit smoking about 19 years ago. Her smoking use included cigarettes. She started smoking about 39 years ago. She has a 20 pack-year smoking history. She has never used smokeless tobacco. She reports that she does not drink alcohol and does not use drugs.  Allergies  Allergen Reactions   Codeine Other (See Comments)    Avoids-felt bad when took before Pt claims she can have this but does not like to take.    Family History  Problem Relation Age of Onset   Hyperlipidemia Mother    Asthma Mother    Lupus Mother    Breast cancer Mother 3   Hypertension Father    Hyperlipidemia  Father    Hypertension Sister    Hypertension Brother    Cancer Neg Hx    Diabetes Neg Hx    Coronary artery disease Neg Hx     Prior to Admission medications   Medication Sig Start Date End Date Taking? Authorizing Provider  acetaminophen (TYLENOL) 325 MG tablet Take 650 mg by mouth every 6 (six) hours as needed for mild pain or moderate pain.   Yes [provider]  albuterol (PROVENTIL) (2.5 MG/3ML) 0.083% nebulizer solution Take 3 mLs (2.5 mg total) by nebulization every 6 (six) hours as needed for wheezing or shortness of breath. 12/22/18  Yes Osvaldo Shipper, MD  albuterol (VENTOLIN HFA) 108 (90 Base) MCG/ACT inhaler INHALE 2 PUFFS EVERY 6 HOURS AS NEEDED FOR WHEEZING OR SHORTNESS OF BREATH Patient taking differently: Inhale 2 puffs into the lungs every 6 (six) hours as needed for shortness of breath or wheezing. 09/23/19  Yes Nyoka Cowden, MD  aspirin  EC 81 MG tablet Take 1 tablet (81 mg total) by mouth daily. Start 02/23/19 02/21/19  Yes Pokhrel, Laxman, MD  atorvastatin (LIPITOR) 20 MG tablet TAKE 1 TABLET (20 MG TOTAL) BY MOUTH DAILY. 03/28/21  Yes Sandre Kitty, MD  fluticasone (FLONASE) 50 MCG/ACT nasal spray USE 2 SPRAYS IN BOTH  NOSTRILS DAILY AS NEEDED  FOR ALLERGIES Patient taking differently: Place 2 sprays into both nostrils daily as needed for allergies. 08/15/21  Yes Nyoka Cowden, MD  hydroxychloroquine (PLAQUENIL) 200 MG tablet TAKE 1 TABLET BY MOUTH DAILY 03/19/23  Yes Nyoka Cowden, MD  metoprolol tartrate (LOPRESSOR) 25 MG tablet Take 1 tablet (25 mg total) by mouth 2 (two) times daily. Patient taking differently: Take 25 mg by mouth daily. 03/09/21  Yes Sandre Kitty, MD  montelukast (SINGULAIR) 10 MG tablet Take 10 mg by mouth at bedtime. 07/31/22  Yes [provider]  Multiple Vitamin (MULTIVITAMIN) capsule Take 1 capsule by mouth daily.   Yes [provider]  omeprazole (PRILOSEC) 20 MG capsule TAKE 1 CAPSULE EVERY DAY Patient taking  differently: Take 20 mg by mouth daily. 01/03/21  Yes Sandre Kitty, MD  predniSONE (DELTASONE) 5 MG tablet 5 mg take 2 until better then gradually taper to one half daily 10/19/21  Yes Nyoka Cowden, MD  sertraline (ZOLOFT) 50 MG tablet TAKE 1 TABLET EVERY DAY Patient taking differently: Take 50 mg by mouth daily. 09/11/20  Yes Sandre Kitty, MD  STIOLTO RESPIMAT 2.5-2.5 MCG/ACT AERS USE 2 INHALATIONS BY MOUTH DAILY 11/02/22  Yes Nyoka Cowden, MD  triamterene-hydrochlorothiazide (MAXZIDE-25) 37.5-25 MG tablet TAKE 1 TABLET EVERY DAY Patient taking differently: Take 1 tablet by mouth daily. TAKE 1 TABLET EVERY DAY 06/02/21  Yes Nyoka Cowden, MD  ferrous sulfate 325 (65 FE) MG tablet Take 1 tablet (325 mg total) by mouth daily with breakfast. Patient not taking: Reported on 06/26/2023 02/21/19   Pokhrel, Rebekah Chesterfield, MD  gabapentin (NEURONTIN) 300 MG capsule TAKE 1 CAPSULE BY MOUTH IN THE  MORNING AND 2 CAPSULES BY MOUTH  IN THE EVENING Patient not taking: Reported on 06/26/2023 11/17/22   Serena Croissant, MD  OXYGEN 2lpm with rest and 3lpm with exertion    [provider]  predniSONE (DELTASONE) 10 MG tablet 2 daily x 2 weeks Patient not taking: Reported on 06/26/2023 03/14/22   Nyoka Cowden, MD  predniSONE (DELTASONE) 10 MG tablet INCREASE TO 20 MG UNTIL BETTER THEN WORK BACK TO PREVIOUS DOSE OF 5 MG DAILY Patient not taking: Reported on 06/26/2023 04/30/23   Nyoka Cowden, MD  predniSONE (DELTASONE) 5 MG tablet After completing 20mg  until better, work back to previous dose of 5 mg daily Patient not taking: Reported on 06/26/2023 11/09/22   Nyoka Cowden, MD    Physical Exam: Vitals:   06/26/23 1611 06/26/23 1615 06/26/23 1830 06/26/23 2023  BP:  112/67 117/67   Pulse:  70 60   Resp:  16 18   Temp:    98.1 F (36.7 C)  TempSrc:    Oral  SpO2:  100% 100%   Weight: 84.8 kg     Height: 5\' 5"  (1.651 m)       Physical Exam Vitals and nursing note reviewed.  Constitutional:       General: She is not in acute distress.    Appearance: She is obese. She is not toxic-appearing or diaphoretic.  HENT:     Head: Normocephalic and atraumatic.  Nose: Nose normal.  Cardiovascular:     Rate and Rhythm: Normal rate and regular rhythm.  Pulmonary:     Effort: Pulmonary effort is normal. No respiratory distress.  Abdominal:     General: Bowel sounds are normal. There is no distension.     Palpations: Abdomen is soft.     Tenderness: There is no abdominal tenderness.  Musculoskeletal:     Right lower leg: No edema.     Left lower leg: No edema.  Skin:    General: Skin is warm.     Capillary Refill: Capillary refill takes less than 2 seconds.  Neurological:     General: No focal deficit present.     Mental Status: She is alert and oriented to person, place, and time.      Labs on Admission: I have personally reviewed following labs and imaging studies  CBC: Recent Labs  Lab 06/26/23 1629  WBC 12.2*  HGB 9.6*  HCT 31.0*  MCV 77.5*  PLT 384   Basic Metabolic Panel: Recent Labs  Lab 06/26/23 1629  NA 137  K 4.3  CL 92*  CO2 31  GLUCOSE 113*  BUN 16  CREATININE 1.24*  CALCIUM 9.5   GFR: Estimated Creatinine Clearance: 49.9 mL/min (A) (by C-G formula based on SCr of 1.24 mg/dL (H)).  Radiological Exams on Admission: I have personally reviewed images CT Chest W Contrast  Result Date: 06/26/2023 CLINICAL DATA:  New finding of brain mass for evaluation of the chest. Previously found to have right lung mass. * Tracking Code: BO * EXAM: CT CHEST WITH CONTRAST TECHNIQUE: Multidetector CT imaging of the chest was performed during intravenous contrast administration. RADIATION DOSE REDUCTION: This exam was performed according to the departmental dose-optimization program which includes automated exposure control, adjustment of the mA and/or kV according to patient size and/or use of iterative reconstruction technique. CONTRAST:  50mL OMNIPAQUE IOHEXOL 350 MG/ML  SOLN COMPARISON:  CTA chest dated 01/21/2022 FINDINGS: Cardiovascular: Normal heart size. No significant pericardial fluid/thickening. Great vessels are normal in course and caliber. No central pulmonary emboli. Aortic atherosclerosis. Mediastinum/Nodes: Imaged thyroid gland without nodules meeting criteria for imaging follow-up by size. Normal esophagus. No pathologically enlarged axillary, supraclavicular, mediastinal, or hilar lymph nodes. Rightward deviation of the upper mediastinum secondary to right lung volume loss. Lungs/Pleura: The central airways are patent. Lobulated right apical, heterogeneously enhancing mass measures 6.4 x 4.9 cm (7:40), increased in size from 3.4 x 2.0 cm on 01/21/2022. Surrounding ground-glass and tree-in-bud nodules within the right upper lobe. Upper lobe predominant centrilobular and paraseptal emphysema. Unchanged irregular 1.3 x 0.7 cm nodule in the subpleural left lower lobe (7:112). Unchanged bilateral lower lobe bronchiectasis and subsegmental atelectasis/scarring of the right middle lobe. No pneumothorax. No pleural effusion. Upper abdomen: Colonic diverticulosis without acute diverticulitis. Musculoskeletal: Increased diffuse sclerosis of T3. Surgical clips in the right breast. IMPRESSION: 1. Increased size of lobulated right apical, heterogeneously enhancing mass, suspicious for primary lung malignancy. Additional imaging evaluation or consultation with Pulmonology or Thoracic Surgery recommended. 2. Surrounding ground-glass and tree-in-bud nodules within the right upper lobe, likely infectious/inflammatory versus lymphangitic spread of disease. 3. Unchanged irregular 1.3 x 0.7 cm nodule in the subpleural left lower lobe. 4. Increased diffuse sclerosis of T3, suspicious for osseous metastatic disease. 5. Aortic Atherosclerosis (ICD10-I70.0) and Emphysema (ICD10-J43.9). Electronically Signed   By: Agustin Cree M.D.   On: 06/26/2023 20:24   MR BRAIN WO CONTRAST  Result Date:  06/26/2023 CLINICAL DATA:  Initial evaluation  for neuro deficit, stroke suspected. Left-sided numbness and tingling. EXAM: MRI HEAD WITHOUT CONTRAST TECHNIQUE: Multiplanar, multiecho pulse sequences of the brain and surrounding structures were obtained without intravenous contrast. COMPARISON:  Prior study from 06/12/2014. FINDINGS: Brain: Mild age-related cerebral atrophy. Mild chronic microvascular ischemic disease for age. No evidence for acute or subacute infarct. No areas of chronic cortical infarction. 1 cm round cystic mass seen involving the right parietal lobe, postcentral gyrus (series 13, image 21). Small focus of associated susceptibility artifact could reflect blood products and/or necrosis. Surrounding T2/FLAIR signal intensity consistent with vasogenic edema. No significant regional mass effect or midline shift. No other visible mass lesion. No hydrocephalus. No extra-axial fluid collection. Pituitary gland suprasellar region within normal limits. Vascular: Major intracranial vascular flow voids are maintained. Skull and upper cervical spine: Craniocervical junction within normal limits. Bone marrow signal intensity normal. No scalp soft tissue abnormality. Sinuses/Orbits: Prior bilateral ocular lens replacement. Right gaze noted. Paranasal sinuses are clear. No significant mastoid effusion. Other: None. IMPRESSION: 1. 1 cm cystic mass involving the right parietal lobe, postcentral gyrus. Surrounding vasogenic edema without midline shift. Finding could reflect a solitary intracranial metastasis versus a primary CNS neoplasm. Follow-up examination with postcontrast MRI of the brain recommended for complete evaluation. 2. Underlying cerebral atrophy with mild chronic microvascular ischemic disease. Electronically Signed   By: Rise Mu M.D.   On: 06/26/2023 18:05    EKG: My personal interpretation of EKG shows: NSR    Assessment/Plan Principal Problem:   Frontal mass of brain Active  Problems:   Mass of upper lobe of right lung   Sarcoidosis (HCC)   HYPERTENSION, BENIGN SYSTEMIC   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    Chronic respiratory failure with hypoxia and hypercapnia (HCC)   Obesity (BMI 30-39.9)   DNR (do not resuscitate)    Assessment and Plan: * Frontal mass of brain Admit to [facility]. Oncology to see in the AM. Continue with IV decadron per neurosurgery. May need lung biopsy. Keep NPO after MN. Hold on heparin/lovenox. May need radiation oncology consult.  Mass of upper lobe of right lung Has lung mass seen on CT in 01-2022. Seen in pulmonary office in 03-2022. F/u cxr in 08-2022 did not show any mass. May need lung biopsy. Keep NPO after MN. Hold on heparin/lovenox.  Obesity (BMI 30-39.9) Stable. BMI 31.1  Chronic respiratory failure with hypoxia and hypercapnia (HCC) Continue with supplemental O2.  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  Chronic. Not exacerbated.  HYPERTENSION, BENIGN SYSTEMIC Stable. Continue lopressor.  Sarcoidosis (HCC) Chronic.  DNR (do not resuscitate) Verified with pt that she wants to be a DNR/DNI. This was witnessed by her dtr Tomekia and  her grand-dtr at bedside.   DVT prophylaxis: SCDs Code Status: DNR/DNI(Do NOT Intubate) Family Communication: discussed with pt, dtr tomekia and grand-dtr  Disposition Plan: return home  Consults called: none  Admission status: Inpatient, Med-Surg   Carollee Herter, DO [facility] 06/26/2023, 9:31 PM

## 2023-06-26 NOTE — Assessment & Plan Note (Signed)
Stable. Continue lopressor.

## 2023-06-26 NOTE — ED Notes (Signed)
Pt tx to MRI

## 2023-06-27 ENCOUNTER — Other Ambulatory Visit: Payer: Self-pay | Admitting: Emergency Medicine

## 2023-06-27 DIAGNOSIS — R918 Other nonspecific abnormal finding of lung field: Secondary | ICD-10-CM

## 2023-06-27 DIAGNOSIS — G9389 Other specified disorders of brain: Secondary | ICD-10-CM | POA: Diagnosis not present

## 2023-06-27 DIAGNOSIS — Z862 Personal history of diseases of the blood and blood-forming organs and certain disorders involving the immune mechanism: Secondary | ICD-10-CM | POA: Diagnosis not present

## 2023-06-27 LAB — CBC
HCT: 29.6 % — ABNORMAL LOW (ref 36.0–46.0)
Hemoglobin: 9 g/dL — ABNORMAL LOW (ref 12.0–15.0)
MCH: 23.8 pg — ABNORMAL LOW (ref 26.0–34.0)
MCHC: 30.4 g/dL (ref 30.0–36.0)
MCV: 78.3 fL — ABNORMAL LOW (ref 80.0–100.0)
Platelets: 386 10*3/uL (ref 150–400)
RBC: 3.78 MIL/uL — ABNORMAL LOW (ref 3.87–5.11)
RDW: 15.1 % (ref 11.5–15.5)
WBC: 13 10*3/uL — ABNORMAL HIGH (ref 4.0–10.5)
nRBC: 0 % (ref 0.0–0.2)

## 2023-06-27 LAB — COMPREHENSIVE METABOLIC PANEL
ALT: 7 U/L (ref 0–44)
AST: 13 U/L — ABNORMAL LOW (ref 15–41)
Albumin: 3.2 g/dL — ABNORMAL LOW (ref 3.5–5.0)
Alkaline Phosphatase: 65 U/L (ref 38–126)
Anion gap: 12 (ref 5–15)
BUN: 24 mg/dL — ABNORMAL HIGH (ref 8–23)
CO2: 28 mmol/L (ref 22–32)
Calcium: 9.1 mg/dL (ref 8.9–10.3)
Chloride: 96 mmol/L — ABNORMAL LOW (ref 98–111)
Creatinine, Ser: 1.25 mg/dL — ABNORMAL HIGH (ref 0.44–1.00)
GFR, Estimated: 48 mL/min — ABNORMAL LOW (ref >60)
Glucose, Bld: 220 mg/dL — ABNORMAL HIGH (ref 70–99)
Potassium: 4.2 mmol/L (ref 3.5–5.1)
Sodium: 136 mmol/L (ref 135–145)
Total Bilirubin: 0.4 mg/dL (ref 0.3–1.2)
Total Protein: 7.2 g/dL (ref 6.5–8.1)

## 2023-06-27 NOTE — Consult Note (Addendum)
NAME:  [name], MRN:  [id number], DOB:  [birth year], LOS: 1 ADMISSION DATE:  06/26/2023, CONSULTATION DATE: 06/27/2023 REFERRING MD: Dr. Ashley Royalty, CHIEF COMPLAINT: Lung mass  History of Present Illness:  Patient with a history of sarcoidosis, emphysema Came in with complaints of left upper extremity numbness, intermittent weakness.  Symptoms lasted a few days Poor appetite, about 30 pound weight loss over the last few months remote history of breast cancer  She did have a CT scan performed in March 2023 that showed a right upper lobe nodule  Current CT does show a large right upper lobe mass lesion, emphysema, groundglass changes, some bronchiectasis at the bases  MRI with a right parietal lobe lesion  Reformed smoker quit in 2005 Sarcoidosis was diagnosed in 1992 -Has used steroids on and off over the years  Chronic hypoxemic respiratory failure  History of breast cancer 2016 had lumpectomy and adjuvant radiation treatment, anastrozole therapy  Pertinent  Medical History   Past Medical History:  Diagnosis Date   Acute on chronic respiratory failure (HCC) 10/09/2016   Allergic rhinitis    Breast cancer (HCC) 2016   right breast   COPD, mild (HCC) FOLLOWED BY DR [name]   GERD (gastroesophageal reflux disease)    HTN (hypertension)    Hypertension    Iron deficiency anemia    Long-term current use of steroids SYMBICORT INHALER   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    Overweight (BMI 25.0-29.9) 05/11/2016   Palpitations    Personal history of radiation therapy 2016   Sarcoidosis STABLE PER CXR JUNE 2013   Shortness of breath    Sickle cell trait (HCC)     Significant Hospital Events: Including procedures, antibiotic start and stop dates in addition to other pertinent events   8/13 CT chest-reviewed by myself showing large right upper lobe mass, no significant adenopathy 8/13 MRI brain  Interim History / Subjective:  Denies any significant symptoms, numbness in her arm is  better Denies any shortness of breath Denies any chest pains or chest discomfort at present  Objective   Blood pressure 118/74, pulse 60, temperature (!) 97.5 F (36.4 C), temperature source Oral, resp. rate 16, height 5\' 5"  (1.651 m), weight 75 kg, SpO2 100%.        Intake/Output Summary (Last 24 hours) at 06/27/2023 0912 Last data filed at 06/27/2023 0300 Gross per 24 hour  Intake 240 ml  Output --  Net 240 ml   Filed Weights   06/26/23 1611 06/26/23 2327 06/27/23 0500  Weight: 84.8 kg 75.8 kg 75 kg    Examination: General: Middle-age, does not appear to be in distress HENT: Moist oral mucosa Lungs: Clear breath sounds with decreased air movement at the bases Cardiovascular: S1-S2 appreciated Abdomen: Soft, bowel sounds appreciated Extremities: No clubbing, no edema Neuro: Alert and oriented x 3, moving all extremities GU:   Resolved Hospital Problem list     Assessment & Plan:  Lung mass -Will need navigational bronchoscopy for optimal biopsy -Will reach out to providers that can help schedule procedure -Options of treatment discussed with the patient -CT was reviewed with the patient -Discussed options of treatment with Dr. 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  The mass is apical Best Practice (right click  and "Reselect all SmartList Selections" daily)   Diet/type: Regular consistency (see orders) DVT prophylaxis: Okay yes with any sponges quickly give yeah yeah DL GI prophylaxis: N/A Lines: N/A Foley:  N/A Code Status:  DNR Last date of multidisciplinary goals of care discussion [discussed with patient at bedside]   Tentative  scheduling for 07/02/2023 with Dr. [name]  Discussed with Dr. Ashley Royalty Patient informed about scheduling  She can be discharged from a pulmonary perspective, to have procedure performed on 07/02/2023 as an outpatient  Virl Diamond, MD [facility] Pager: See Loretha Stapler

## 2023-06-27 NOTE — Evaluation (Signed)
Physical Therapy Evaluation Patient Details Name: [name] MRN: [id number] DOB: 1961-10-02 Today's Date: 06/27/2023  History of Present Illness  Pt is 62 yo female admitted 06/26/23 with L arm tingling. Pt found to have frontal lobe brain mass and mass in upper lobe of R lung. Pt started on IV decadron, non-surgical per neurosurgery Pt with hx including but not limited to breast CA, lumpectomy and XRT, COPD, resp failure on home O2, sarcoidosis, HTN  Clinical Impression  Pt admitted with above diagnosis. At baseline pt is independent.  She lives with her grandson who works.  Today, pt ambulated 400' and performed 4 steps with 1 rail similar to home set up.  She demonstrated good balance and mobility.  She did have the decreased sensation in medial L UE - reports some improvement.  Also reports R leg fatigues easier but reports at times is like this at baseline.  After walking R LE strength with slight decrease to 4+/5 ut still functional.  Pt was on 2 L with VSS.  Pt appears to be at baseline and no further PT needs.      If plan is discharge home, recommend the following:     Can travel by private vehicle        Equipment Recommendations None recommended by PT  Recommendations for Other Services       Functional Status Assessment Patient has not had a recent decline in their functional status     Precautions / Restrictions Precautions Precautions: None      Mobility  Bed Mobility Overal bed mobility: Independent                  Transfers Overall transfer level: Independent                 General transfer comment: Has been mobilizing in room independently , demonstrated safely with therapy    Ambulation/Gait Ambulation/Gait assistance: Supervision Gait Distance (Feet): 400 Feet Assistive device: None         General Gait Details: Slow but functional speed, steady gait, slight decrease in weight shift to right that increased with fatigue - reports  tends to sleep on R side and R leg fatigues baseline  Stairs Stairs: Yes Stairs assistance: Supervision Stair Management: One rail Right, Alternating pattern, Forwards Number of Stairs: 4    Wheelchair Mobility     Tilt Bed    Modified Rankin (Stroke Patients Only)       Balance Overall balance assessment: Needs assistance Sitting-balance support: No upper extremity supported Sitting balance-Leahy Scale: Normal     Standing balance support: No upper extremity supported Standing balance-Leahy Scale: Good                               Pertinent Vitals/Pain Pain Assessment Pain Assessment: No/denies pain    Home Living Family/patient expects to be discharged to:: Private residence Living Arrangements: Other (Comment) (grandson) Available Help at Discharge: Family;Available PRN/intermittently Type of Home: Apartment Home Access: Stairs to enter Entrance Stairs-Rails: Right Entrance Stairs-Number of Steps: 6   Home Layout: One level Home Equipment: None Additional Comments: On 2 L O2 at all times; does bump up if going to be up and cleaning but not for normal ambulation    Prior Function Prior Level of Function : Independent/Modified Independent;Driving             Mobility Comments: COmmunity ambulation without AD: denies falls  ADLs Comments: independent adls and iadls     Extremity/Trunk Assessment   Upper Extremity Assessment Upper Extremity Assessment: LUE deficits/detail;RUE deficits/detail RUE Deficits / Details: ROM WFL; MMT 5/5; sensation intact LUE Deficits / Details: ROM WFL; MMT 5/5; Sensation: dulled medial 4th digit, 5th digit, medial hand, medial forearm and medial just above elbow    Lower Extremity Assessment Lower Extremity Assessment: LLE deficits/detail;RLE deficits/detail RLE Deficits / Details: ROM WFL; MMT ankle DF, knee ext, hip abd in sitting, hip flex all 5/5 initially, after ambulation some fatigue and 4+/5 LLE  Deficits / Details: ROM WFL; MMT ankle DF, knee ext, hip abd in sitting, hip flex all 5/5    Cervical / Trunk Assessment Cervical / Trunk Assessment: Normal  Communication      Cognition Arousal: Alert Behavior During Therapy: WFL for tasks assessed/performed Overall Cognitive Status: Within Functional Limits for tasks assessed                                          General Comments General comments (skin integrity, edema, etc.): Pt on 2 L O2 with sats 98% rest and 92% ambulation.  Pt had been ambulating to restroom without O2, found extension tubes and connected so pt could ambulate to restoom on O2  Pt does report will occasionally get dizzy at home if she hops up from bed quickly, states typically when she waitstill last min to go to restroom.  Educated on slow transfers, monitoring symptoms in sitting prior to standing, and monitoring in standing prior to walking with return to sit if dizzy.     Exercises     Assessment/Plan    PT Assessment Patient does not need any further PT services  PT Problem List         PT Treatment Interventions      PT Goals (Current goals can be found in the Care Plan section)  Acute Rehab PT Goals Patient Stated Goal: return home PT Goal Formulation: All assessment and education complete, DC therapy    Frequency       Co-evaluation               AM-PAC PT "6 Clicks" Mobility  Outcome Measure Help needed turning from your back to your side while in a flat bed without using bedrails?: None Help needed moving from lying on your back to sitting on the side of a flat bed without using bedrails?: None Help needed moving to and from a bed to a chair (including a wheelchair)?: None Help needed standing up from a chair using your arms (e.g., wheelchair or bedside chair)?: None Help needed to walk in hospital room?: None Help needed climbing 3-5 steps with a railing? : A Little 6 Click Score: 23    End of Session  Equipment Utilized During Treatment: Gait belt Activity Tolerance: Patient tolerated treatment well Patient left: in bed;with call bell/phone within reach Nurse Communication: Mobility status PT Visit Diagnosis: Other abnormalities of gait and mobility (R26.89)    Time: 6578-4696 PT Time Calculation (min) (ACUTE ONLY): 24 min   Charges:   PT Evaluation $PT Eval Low Complexity: 1 Low PT Treatments $Gait Training: 8-22 mins PT General Charges $$ ACUTE PT VISIT: 1 Visit         Anise Salvo, PT Acute Rehab Services [facility] [phone]   Rayetta Humphrey 06/27/2023, 4:42 PM

## 2023-06-27 NOTE — Progress Notes (Signed)
Requesting endo slot for robotic nav

## 2023-06-27 NOTE — Progress Notes (Signed)
Mobility Specialist - Progress Note   06/27/23 1400  Oxygen Therapy  SpO2 (!) 87 %  O2 Device Nasal Cannula  O2 Flow Rate (L/min) 2 L/min  Patient Activity (if Appropriate) Ambulating  Mobility  Activity Ambulated independently in hallway  Level of Assistance Independent  Assistive Device None  Distance Ambulated (ft) 460 ft  Activity Response Tolerated well  Mobility Referral Yes  $Mobility charge 1 Mobility  Mobility Specialist Start Time (ACUTE ONLY) 0223  Mobility Specialist Stop Time (ACUTE ONLY) 0234  Mobility Specialist Time Calculation (min) (ACUTE ONLY) 11 min   Pt received in bed and agreeable to mobility. No complaints during session. Pt to EOB after session with all needs met.    During mobility: 87%SpO2 (2L [place]) Post-mobility: 90% SPO2 (2L )  Chief Technology Officer

## 2023-06-27 NOTE — Plan of Care (Signed)
Discuss and review plan of care with patient/family  

## 2023-06-27 NOTE — Progress Notes (Signed)
PROGRESS NOTE    [name]  ZOX:[id number] DOB: May 09, 1961 DOA: 06/26/2023 PCP: [name]., FNP   Brief Narrative: 62 year old African-American female history of breast cancer status postlumpectomy status post XRT, history of COPD, chronic hypoxic respiratory failure on home oxygen, obesity, history of sarcoid, hypertension, presents to the ER today with an overnight history of left arm numbness and paresthesias.  She states that she woke up last night with her arm tingling having pins and needle sensation.  Denies any weakness.  She is describes some spasms in her hand.  Denied any facial drooping.  No trouble speaking.  Did not have any trouble walking.  Came to the ER for evaluation.   Patient with a long history of sarcoidosis.  She has multiple pulmonary reticular nodules.   In March 2023, she presented to the ER with palpitations.  She had a CT scan performed of her chest which showed a 1.8 x 2.2 spiculated mass in the right upper lobe lobe.  Also noted during that scan was prominent emphysematous changes with scattered fibrosis.   Patient followed up with pulmonology in May 2023.  Her CT scan from March 2023 was noted.  At that time, pulmonology's plan was to have the patient follow-up in 3 months.  She was to continue taking 20 mg of prednisone daily for her sarcoid.  Pulmonology noted that she was at low risk but not without "no risk".  Follow-up chest x-ray was ordered. Patient did have a Chest x-ray performed in December 2023 ordered by Dr. [name].  The previously seen spiculated right apical nodule was not seen on chest x-ray in October. On arrival to ER, temp 98.1 heart rate 77 blood pressure 123/72 satting 96% on 2 L. White count 12.2, hemoglobin 9.6, platelets 384 Sodium 137, potassium 4.3, bicarb 31, BUN 16, creatinine 1.24 MRI of the brain showed a 1 cm cystic mass involving the right parietal lobe and postcentral gyrus.  There is vasogenic edema without midline shift. CT  chest with IV contrast was and showed a 6.4 x 4.9 cm lobulated right apical mass there is also some suspicious lesion in T3 suspicious for osteo metastatic disease. Neurosurgery was consulted.  Patient seen by Dr. [name].  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  Patient has been started on Decadron. 8/14 seen by PCCM ointment with Dr. [name] on 07/02/2023 for navigational bronchoscopy and biopsy   Assessment & Plan:   Principal Problem:   Frontal mass of brain Active Problems:   Mass of upper lobe of right lung   Sarcoidosis (HCC)   HYPERTENSION, BENIGN SYSTEMIC   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    Chronic respiratory failure with hypoxia and hypercapnia (HCC)   Obesity (BMI 30-39.9)   DNR (do not resuscitate)   1 right upper lobe lung mass/frontal mass right parietal lobe and postcentral gyrus these are new findings.  Seen by pulmonary has appointment with Dr. [name] on the 19th for navigational bronchoscopy and biopsy Oncology has been notified Neurosurgery recommends Decadron 4 mg IV every 6 Follow-up labs today Out of bed ambulate Possible DC home in a.m.  2 chronic hypoxic hypercapnic respiratory failure on supplemental oxygen at home Prior smoker  3 sarcoidosis followed by pulmonologist on and off steroids Currently on Decadron for  #1 Continue Plaquenil  4 hypertension on metoprolol tartrate 25 mg  daily, Maxide.  5 depression on Zoloft  Estimated body mass index is 27.51 kg/m as calculated from the following:   Height as of this encounter: 5\' 5"  (1.651 m).   Weight as of this encounter: 75 kg.  DVT prophylaxis: Lovenox Code Status: DNR  family Communication: Disposition Plan:  Status is: Inpatient   Consultants: PCCM Oncology  Procedures: None Antimicrobials: None  Subjective:  Resting in bed did not sleep well  denies pain denies headache Has oxygen at home 24/7 Objective: Vitals:   06/27/23 0500 06/27/23 0742 06/27/23 0745 06/27/23 0746  BP:    118/74  Pulse:    60  Resp:    16  Temp:    (!) 97.5 F (36.4 C)  TempSrc:    Oral  SpO2:  99% 99% 100%  Weight: 75 kg     Height:        Intake/Output Summary (Last 24 hours) at 06/27/2023 1052 Last data filed at 06/27/2023 0300 Gross per 24 hour  Intake 240 ml  Output --  Net 240 ml   Filed Weights   06/26/23 1611 06/26/23 2327 06/27/23 0500  Weight: 84.8 kg 75.8 kg 75 kg    Examination:  General exam: Appears in no acute distress Respiratory system: Few rhonchi to auscultation. Respiratory effort normal. Cardiovascular system: S1 & S2 heard, RRR. No JVD, murmurs, rubs, gallops or clicks. No pedal edema. Gastrointestinal system: Abdomen is nondistended, soft and nontender. No organomegaly or masses felt. Normal bowel sounds heard. Central nervous system: Alert and oriented. No focal neurological deficits. Extremities: No edema  Data Reviewed: I have personally reviewed following labs and imaging studies  CBC: Recent Labs  Lab 06/26/23 1629  WBC 12.2*  HGB 9.6*  HCT 31.0*  MCV 77.5*  PLT 384   Basic Metabolic Panel: Recent Labs  Lab 06/26/23 1629  NA 137  K 4.3  CL 92*  CO2 31  GLUCOSE 113*  BUN 16  CREATININE 1.24*  CALCIUM 9.5   GFR: Estimated Creatinine Clearance: 47.1 mL/min (A) (by C-G formula based on SCr of 1.24 mg/dL (H)). Liver Function Tests: No results for input(s): "AST", "ALT", "ALKPHOS", "BILITOT", "PROT", "ALBUMIN" in the last 168 hours. No results for input(s): "LIPASE", "AMYLASE" in the last 168 hours. No results for input(s): "AMMONIA" in the last 168 hours. Coagulation Profile: No results for input(s): "INR", "PROTIME" in the last 168 hours. Cardiac Enzymes: No results for input(s): "CKTOTAL", "CKMB", "CKMBINDEX", "TROPONINI" in the last 168 hours. BNP (last 3 results) No results for  input(s): "PROBNP" in the last 8760 hours. HbA1C: No results for input(s): "HGBA1C" in the last 72 hours. CBG: No results for input(s): "GLUCAP" in the last 168 hours. Lipid Profile: No results for input(s): "CHOL", "HDL", "LDLCALC", "TRIG", "CHOLHDL", "LDLDIRECT" in the last 72 hours. Thyroid Function Tests: No results for input(s): "TSH", "T4TOTAL", "FREET4", "T3FREE", "THYROIDAB" in the last 72 hours. Anemia Panel: No results for input(s): "VITAMINB12", "FOLATE", "FERRITIN", "TIBC", "IRON", "RETICCTPCT" in the last 72 hours. Sepsis Labs: No results for input(s): "PROCALCITON", "LATICACIDVEN" in the last 168 hours.  No results found for this or any previous visit (from the past 240 hour(s)).       Radiology Studies: CT Chest W Contrast  Result Date: 06/26/2023 CLINICAL DATA:  New finding of brain mass for evaluation of the chest. Previously found to have right lung mass. * Tracking Code: BO * EXAM: CT CHEST WITH CONTRAST TECHNIQUE: Multidetector CT imaging of the chest was performed during intravenous  contrast administration. RADIATION DOSE REDUCTION: This exam was performed according to the departmental dose-optimization program which includes automated exposure control, adjustment of the mA and/or kV according to patient size and/or use of iterative reconstruction technique. CONTRAST:  50mL OMNIPAQUE IOHEXOL 350 MG/ML SOLN COMPARISON:  CTA chest dated 01/21/2022 FINDINGS: Cardiovascular: Normal heart size. No significant pericardial fluid/thickening. Great vessels are normal in course and caliber. No central pulmonary emboli. Aortic atherosclerosis. Mediastinum/Nodes: Imaged thyroid gland without nodules meeting criteria for imaging follow-up by size. Normal esophagus. No pathologically enlarged axillary, supraclavicular, mediastinal, or hilar lymph nodes. Rightward deviation of the upper mediastinum secondary to right lung volume loss. Lungs/Pleura: The central airways are patent.  Lobulated right apical, heterogeneously enhancing mass measures 6.4 x 4.9 cm (7:40), increased in size from 3.4 x 2.0 cm on 01/21/2022. Surrounding ground-glass and tree-in-bud nodules within the right upper lobe. Upper lobe predominant centrilobular and paraseptal emphysema. Unchanged irregular 1.3 x 0.7 cm nodule in the subpleural left lower lobe (7:112). Unchanged bilateral lower lobe bronchiectasis and subsegmental atelectasis/scarring of the right middle lobe. No pneumothorax. No pleural effusion. Upper abdomen: Colonic diverticulosis without acute diverticulitis. Musculoskeletal: Increased diffuse sclerosis of T3. Surgical clips in the right breast. IMPRESSION: 1. Increased size of lobulated right apical, heterogeneously enhancing mass, suspicious for primary lung malignancy. Additional imaging evaluation or consultation with Pulmonology or Thoracic Surgery recommended. 2. Surrounding ground-glass and tree-in-bud nodules within the right upper lobe, likely infectious/inflammatory versus lymphangitic spread of disease. 3. Unchanged irregular 1.3 x 0.7 cm nodule in the subpleural left lower lobe. 4. Increased diffuse sclerosis of T3, suspicious for osseous metastatic disease. 5. Aortic Atherosclerosis (ICD10-I70.0) and Emphysema (ICD10-J43.9). Electronically Signed   By: Agustin Cree M.D.   On: 06/26/2023 20:24   MR BRAIN WO CONTRAST  Result Date: 06/26/2023 CLINICAL DATA:  Initial evaluation for neuro deficit, stroke suspected. Left-sided numbness and tingling. EXAM: MRI HEAD WITHOUT CONTRAST TECHNIQUE: Multiplanar, multiecho pulse sequences of the brain and surrounding structures were obtained without intravenous contrast. COMPARISON:  Prior study from 06/12/2014. FINDINGS: Brain: Mild age-related cerebral atrophy. Mild chronic microvascular ischemic disease for age. No evidence for acute or subacute infarct. No areas of chronic cortical infarction. 1 cm round cystic mass seen involving the right parietal  lobe, postcentral gyrus (series 13, image 21). Small focus of associated susceptibility artifact could reflect blood products and/or necrosis. Surrounding T2/FLAIR signal intensity consistent with vasogenic edema. No significant regional mass effect or midline shift. No other visible mass lesion. No hydrocephalus. No extra-axial fluid collection. Pituitary gland suprasellar region within normal limits. Vascular: Major intracranial vascular flow voids are maintained. Skull and upper cervical spine: Craniocervical junction within normal limits. Bone marrow signal intensity normal. No scalp soft tissue abnormality. Sinuses/Orbits: Prior bilateral ocular lens replacement. Right gaze noted. Paranasal sinuses are clear. No significant mastoid effusion. Other: None. IMPRESSION: 1. 1 cm cystic mass involving the right parietal lobe, postcentral gyrus. Surrounding vasogenic edema without midline shift. Finding could reflect a solitary intracranial metastasis versus a primary CNS neoplasm. Follow-up examination with postcontrast MRI of the brain recommended for complete evaluation. 2. Underlying cerebral atrophy with mild chronic microvascular ischemic disease. Electronically Signed   By: Rise Mu M.D.   On: 06/26/2023 18:05        Scheduled Meds:  arformoterol  15 mcg Nebulization BID   And   umeclidinium bromide  1 puff Inhalation Daily   aspirin EC  81 mg Oral Daily   atorvastatin  20 mg Oral Daily  dexamethasone (DECADRON) injection  4 mg Intravenous Q6H   hydroxychloroquine  200 mg Oral Daily   metoprolol tartrate  25 mg Oral Daily   pantoprazole  40 mg Oral Daily   sertraline  50 mg Oral Daily   triamterene-hydrochlorothiazide  1 tablet Oral Daily   Continuous Infusions:   LOS: 1 day    Time spent: 39 min Alwyn Ren, MD 06/27/2023, 10:52 AM

## 2023-06-28 DIAGNOSIS — G9389 Other specified disorders of brain: Secondary | ICD-10-CM | POA: Diagnosis not present

## 2023-06-28 MED ORDER — DEXAMETHASONE 4 MG PO TABS: ORAL_TABLET | ORAL | 1 refills | Status: DC

## 2023-06-28 MED ORDER — ONDANSETRON HCL 4 MG PO TABS: 4.0000 mg | ORAL_TABLET | Freq: Four times a day (QID) | ORAL | 0 refills | Status: DC | PRN

## 2023-06-28 NOTE — H&P (View-Only) (Signed)
 NAME:  [name], MRN:  [id number], DOB:  Oct 22, 1961, LOS: 2 ADMISSION DATE:  06/26/2023, CONSULTATION DATE: 06/27/2023 REFERRING MD: Dr. Ashley Royalty, CHIEF COMPLAINT: Lung mass  History of Present Illness:  Patient with a history of sarcoidosis, emphysema Came in with complaints of left upper extremity numbness, intermittent weakness.  Symptoms lasted a few days Poor appetite, about 30 pound weight loss over the last few months remote history of breast cancer   She did have a CT scan performed in March 2023 that showed a right upper lobe nodule   Current CT does show a large right upper lobe mass lesion, emphysema, groundglass changes, some bronchiectasis at the bases   MRI with a right parietal lobe lesion   Reformed smoker quit in 2005 Sarcoidosis was diagnosed in 1992 -Has used steroids on and off over the years   Chronic hypoxemic respiratory failure   History of breast cancer 2016 had lumpectomy and adjuvant radiation treatment, anastrozole therapy  Pertinent  Medical History   Past Medical History:  Diagnosis Date   Acute on chronic respiratory failure (HCC) 10/09/2016   Allergic rhinitis    Breast cancer (HCC) 2016   right breast   COPD, mild (HCC) FOLLOWED BY DR [name]   GERD (gastroesophageal reflux disease)    HTN (hypertension)    Hypertension    Iron deficiency anemia    Long-term current use of steroids SYMBICORT INHALER   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    Overweight (BMI 25.0-29.9) 05/11/2016   Palpitations    Personal history of radiation therapy 2016   Sarcoidosis STABLE PER CXR JUNE 2013   Shortness of breath    Sickle cell trait (HCC)      Significant Hospital Events: Including procedures, antibiotic start and stop dates in addition to other pertinent events   8/13 CT chest-reviewed by myself showing large right upper lobe mass, no significant adenopathy 8/13 MRI brain-parietal lesion  Interim History / Subjective:  Feels comfortable Numbness in her arm is  better Denies shortness of breath, denies any chest pains or chest discomfort  Objective   Blood pressure 129/85, pulse 64, temperature 97.7 F (36.5 C), temperature source Oral, resp. rate 16, height 5\' 5"  (1.651 m), weight 76.3 kg, SpO2 99%.        Intake/Output Summary (Last 24 hours) at 06/28/2023 1009 Last data filed at 06/28/2023 0810 Gross per 24 hour  Intake 960 ml  Output --  Net 960 ml   Filed Weights   06/26/23 2327 06/27/23 0500 06/28/23 0430  Weight: 75.8 kg 75 kg 76.3 kg    Examination: General: Middle-aged, does not appear to be in distress, HENT: Moist oral mucosa Lungs: Decreased air movement at the bases but generally clear Cardiovascular: S1-S2 appreciated Abdomen: Soft, bowel sounds appreciated Extremities: No clubbing, no edema Neuro: Alert and oriented x 3 GU:   Resolved Hospital Problem list     Assessment & Plan:  Lung mass -Scheduled for navigational bronchoscopy 07/02/2023 at 0900 hrs. -Procedure discussed with the patient and agreeable to proceed -Updated Dr. Ashley Royalty  Obstructive lung disease -Continue bronchodilator treatments -Was on Stiolto at home  Chronic hypoxemic respiratory failure -Continue oxygen supplementation  History of sarcoidosis for which she was on prednisone chronically  Parietal brain mass -On Decadron  Navigational bronchoscopy scheduled for 0900 hrs. 07/02/2023 From a pulmonary perspective, patient can be discharged home today to follow-up for a bronchoscopy  I will sign off at present  Virl Diamond, MD [place] [facility] Pager: See Loretha Stapler

## 2023-06-28 NOTE — Progress Notes (Signed)
NAME:  [name], MRN:  [id number], DOB:  Oct 22, 1961, LOS: 2 ADMISSION DATE:  06/26/2023, CONSULTATION DATE: 06/27/2023 REFERRING MD: Dr. Ashley Royalty, CHIEF COMPLAINT: Lung mass  History of Present Illness:  Patient with a history of sarcoidosis, emphysema Came in with complaints of left upper extremity numbness, intermittent weakness.  Symptoms lasted a few days Poor appetite, about 30 pound weight loss over the last few months remote history of breast cancer   She did have a CT scan performed in March 2023 that showed a right upper lobe nodule   Current CT does show a large right upper lobe mass lesion, emphysema, groundglass changes, some bronchiectasis at the bases   MRI with a right parietal lobe lesion   Reformed smoker quit in 2005 Sarcoidosis was diagnosed in 1992 -Has used steroids on and off over the years   Chronic hypoxemic respiratory failure   History of breast cancer 2016 had lumpectomy and adjuvant radiation treatment, anastrozole therapy  Pertinent  Medical History   Past Medical History:  Diagnosis Date   Acute on chronic respiratory failure (HCC) 10/09/2016   Allergic rhinitis    Breast cancer (HCC) 2016   right breast   COPD, mild (HCC) FOLLOWED BY DR [name]   GERD (gastroesophageal reflux disease)    HTN (hypertension)    Hypertension    Iron deficiency anemia    Long-term current use of steroids SYMBICORT INHALER   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    Overweight (BMI 25.0-29.9) 05/11/2016   Palpitations    Personal history of radiation therapy 2016   Sarcoidosis STABLE PER CXR JUNE 2013   Shortness of breath    Sickle cell trait (HCC)      Significant Hospital Events: Including procedures, antibiotic start and stop dates in addition to other pertinent events   8/13 CT chest-reviewed by myself showing large right upper lobe mass, no significant adenopathy 8/13 MRI brain-parietal lesion  Interim History / Subjective:  Feels comfortable Numbness in her arm is  better Denies shortness of breath, denies any chest pains or chest discomfort  Objective   Blood pressure 129/85, pulse 64, temperature 97.7 F (36.5 C), temperature source Oral, resp. rate 16, height 5\' 5"  (1.651 m), weight 76.3 kg, SpO2 99%.        Intake/Output Summary (Last 24 hours) at 06/28/2023 1009 Last data filed at 06/28/2023 0810 Gross per 24 hour  Intake 960 ml  Output --  Net 960 ml   Filed Weights   06/26/23 2327 06/27/23 0500 06/28/23 0430  Weight: 75.8 kg 75 kg 76.3 kg    Examination: General: Middle-aged, does not appear to be in distress, HENT: Moist oral mucosa Lungs: Decreased air movement at the bases but generally clear Cardiovascular: S1-S2 appreciated Abdomen: Soft, bowel sounds appreciated Extremities: No clubbing, no edema Neuro: Alert and oriented x 3 GU:   Resolved Hospital Problem list     Assessment & Plan:  Lung mass -Scheduled for navigational bronchoscopy 07/02/2023 at 0900 hrs. -Procedure discussed with the patient and agreeable to proceed -Updated Dr. Ashley Royalty  Obstructive lung disease -Continue bronchodilator treatments -Was on Stiolto at home  Chronic hypoxemic respiratory failure -Continue oxygen supplementation  History of sarcoidosis for which she was on prednisone chronically  Parietal brain mass -On Decadron  Navigational bronchoscopy scheduled for 0900 hrs. 07/02/2023 From a pulmonary perspective, patient can be discharged home today to follow-up for a bronchoscopy  I will sign off at present  Virl Diamond, MD [facility] Pager: See Loretha Stapler

## 2023-06-28 NOTE — Discharge Summary (Signed)
Physician Discharge Summary  [name] VHQ:[id number] DOB: 02/05/1961 DOA: 06/26/2023  PCP: [name]., FNP  Admit date: 06/26/2023 Discharge date: 06/28/2023  Admitted From: Home Disposition: Home  Recommendations for Outpatient Follow-up:  Follow up with PCP in 1-2 weeks Please obtain BMP/CBC in one week Please follow up with with PCCM Scheduled to have navigational bronchoscopy 07/02/2023 Dr. [name] Home Health: None  Equipment/Devices: None  Discharge Condition: Stable CODE STATUS: Full code Diet recommendation: Cardiac Brief/Interim Summary:  62 year old African-American female history of breast cancer status postlumpectomy status post XRT, history of COPD, chronic hypoxic respiratory failure on home oxygen, obesity, history of sarcoid, hypertension, presents to the ER today with an overnight history of left arm numbness and paresthesias.  She states that she woke up last night with her arm tingling having pins and needle sensation.  Denies any weakness.  She is describes some spasms in her hand.  Denied any facial drooping.  No trouble speaking.  Did not have any trouble walking. Patient with a long history of sarcoidosis.  She has multiple pulmonary reticular nodules. In March 2023, she presented to the ER with palpitations.  She had a CT scan performed of her chest which showed a 1.8 x 2.2 spiculated mass in the right upper lobe lobe.  Also noted during that scan was prominent emphysematous changes with scattered fibrosis. Patient followed up with pulmonology in May 2023.  Her CT scan from March 2023 was noted.  At that time, pulmonology's plan was to have the patient follow-up in 3 months.  She was to continue taking 20 mg of prednisone daily for her sarcoid.  Pulmonology noted that she was at low risk but not without "no risk".  Follow-up chest x-ray was ordered. Patient did have a Chest x-ray performed in December 2023 ordered by Dr. [name].  The previously seen spiculated  right apical nodule was not seen on chest x-ray in October. On arrival to ER, temp 98.1 heart rate 77 blood pressure 123/72 satting 96% on 2 L. White count 12.2, hemoglobin 9.6, platelets 384 Sodium 137, potassium 4.3, bicarb 31, BUN 16, creatinine 1.24 MRI of the brain showed a 1 cm cystic mass involving the right parietal lobe and postcentral gyrus.  There is vasogenic edema without midline shift. CT chest with IV contrast was and showed a 6.4 x 4.9 cm lobulated right apical mass there is also some suspicious lesion in T3 suspicious for osteo metastatic disease. Neurosurgery was consulted.  Patient seen by Dr. [name].  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  Patient has been started on Decadron. 8/14 seen by PCCM ointment with Dr. [name] on 07/02/2023 for navigational bronchoscopy and biopsy  Discharge Diagnoses:  Principal Problem:   Frontal mass of brain Active Problems:   Mass of upper lobe of right lung   Sarcoidosis (HCC)   HYPERTENSION, BENIGN SYSTEMIC   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    Chronic respiratory failure with hypoxia and hypercapnia (HCC)   Obesity (BMI 30-39.9)   DNR (do not resuscitate)    1 Right upper lobe lung mass/frontal mass right parietal lobe and postcentral gyrus these are new findings.  Seen by pulmonary has appointment with Dr. [name] on the 19th for navigational bronchoscopy and biopsy Oncology has been notified on admitted however since we did not have tissue biopsy patient was not seen  during the hospital stay.  Dr. [name] recommended Decadron and was thought to be not a surgical candidate.   2 chronic hypoxic hypercapnic respiratory failure on supplemental oxygen at home   3 sarcoidosis followed by pulmonologist on and off steroids Currently on Decadron for  #1 Continue Plaquenil Continue Decadron on  discharge.   4 hypertension on metoprolol tartrate 25 mg daily, Maxide.   5 depression on Zoloft  Estimated body mass index is 27.99 kg/m as calculated from the following:   Height as of this encounter: 5\' 5"  (1.651 m).   Weight as of this encounter: 76.3 kg.  Discharge Instructions  Discharge Instructions     Diet - low sodium heart healthy   Complete by: As directed    Increase activity slowly   Complete by: As directed       Allergies as of 06/28/2023       Reactions   Codeine Other (See Comments)   Avoids-felt bad when took before Pt claims she can have this but does not like to take.        Medication List     STOP taking these medications    ferrous sulfate 325 (65 FE) MG tablet   gabapentin 300 MG capsule Commonly known as: NEURONTIN   predniSONE 10 MG tablet Commonly known as: DELTASONE   predniSONE 5 MG tablet Commonly known as: DELTASONE       TAKE these medications    acetaminophen 325 MG tablet Commonly known as: TYLENOL Take 650 mg by mouth every 6 (six) hours as needed for mild pain or moderate pain.   albuterol (2.5 MG/3ML) 0.083% nebulizer solution Commonly known as: PROVENTIL Take 3 mLs (2.5 mg total) by nebulization every 6 (six) hours as needed for wheezing or shortness of breath. What changed: Another medication with the same name was changed. Make sure you understand how and when to take each.   albuterol 108 (90 Base) MCG/ACT inhaler Commonly known as: VENTOLIN HFA INHALE 2 PUFFS EVERY 6 HOURS AS NEEDED FOR WHEEZING OR SHORTNESS OF BREATH What changed: See the new instructions.   aspirin EC 81 MG tablet Take 1 tablet (81 mg total) by mouth daily. Start 02/23/19   atorvastatin 20 MG tablet Commonly known as: LIPITOR TAKE 1 TABLET (20 MG TOTAL) BY MOUTH DAILY.   dexamethasone 4 MG tablet Commonly known as: DECADRON Decadron 4 mg 4 times a day for 3 days Then Decadron 4 mg 3 times a day for 3 days Then stay on Decadron 4  mg twice a day till followed by PCP.   fluticasone 50 MCG/ACT nasal spray Commonly known as: FLONASE USE 2 SPRAYS IN BOTH  NOSTRILS DAILY AS NEEDED  FOR ALLERGIES What changed: See the new instructions.   hydroxychloroquine 200 MG tablet Commonly known as: PLAQUENIL TAKE 1 TABLET BY MOUTH DAILY   metoprolol tartrate 25 MG tablet Commonly known as: LOPRESSOR Take 1 tablet (25 mg total) by mouth 2 (two) times daily. What changed: when to take this   montelukast 10 MG tablet Commonly known as: SINGULAIR Take 10 mg by mouth at bedtime.   multivitamin capsule Take 1 capsule by mouth daily.   omeprazole 20 MG capsule Commonly known as: PRILOSEC TAKE 1 CAPSULE EVERY DAY   ondansetron 4 MG tablet Commonly known as: ZOFRAN Take 1 tablet (4 mg total) by mouth every 6 (six) hours as needed for nausea.   OXYGEN 2lpm with rest and 3lpm with exertion   sertraline 50 MG  tablet Commonly known as: ZOLOFT TAKE 1 TABLET EVERY DAY   Stiolto Respimat 2.5-2.5 MCG/ACT Aers Generic drug: Tiotropium Bromide-Olodaterol USE 2 INHALATIONS BY MOUTH DAILY   triamterene-hydrochlorothiazide 37.5-25 MG tablet Commonly known as: MAXZIDE-25 TAKE 1 TABLET EVERY DAY What changed: additional instructions        Follow-up Information     [name]., FNP Follow up.   Specialty: Family Medicine Contact information: [address] [place] [address] [phone]         Leslye Peer, MD Follow up.   Specialty: Pulmonary Disease Why: Follow-up appointment with Dr. [name] on 07/02/2023 at 9 AM Contact information: [address] [place] [address] [phone]         Tomma Lightning, MD Follow up.   Specialty: Pulmonary Disease Contact information: [address] [place] [address] [phone]                Allergies  Allergen Reactions   Codeine Other (See Comments)    Avoids-felt bad when took before Pt claims she can  have this but does not like to take.    Consultations: PCCM and neurosurgery   Procedures/Studies: CT Chest W Contrast  Result Date: 06/26/2023 CLINICAL DATA:  New finding of brain mass for evaluation of the chest. Previously found to have right lung mass. * Tracking Code: BO * EXAM: CT CHEST WITH CONTRAST TECHNIQUE: Multidetector CT imaging of the chest was performed during intravenous contrast administration. RADIATION DOSE REDUCTION: This exam was performed according to the departmental dose-optimization program which includes automated exposure control, adjustment of the mA and/or kV according to patient size and/or use of iterative reconstruction technique. CONTRAST:  50mL OMNIPAQUE IOHEXOL 350 MG/ML SOLN COMPARISON:  CTA chest dated 01/21/2022 FINDINGS: Cardiovascular: Normal heart size. No significant pericardial fluid/thickening. Great vessels are normal in course and caliber. No central pulmonary emboli. Aortic atherosclerosis. Mediastinum/Nodes: Imaged thyroid gland without nodules meeting criteria for imaging follow-up by size. Normal esophagus. No pathologically enlarged axillary, supraclavicular, mediastinal, or hilar lymph nodes. Rightward deviation of the upper mediastinum secondary to right lung volume loss. Lungs/Pleura: The central airways are patent. Lobulated right apical, heterogeneously enhancing mass measures 6.4 x 4.9 cm (7:40), increased in size from 3.4 x 2.0 cm on 01/21/2022. Surrounding ground-glass and tree-in-bud nodules within the right upper lobe. Upper lobe predominant centrilobular and paraseptal emphysema. Unchanged irregular 1.3 x 0.7 cm nodule in the subpleural left lower lobe (7:112). Unchanged bilateral lower lobe bronchiectasis and subsegmental atelectasis/scarring of the right middle lobe. No pneumothorax. No pleural effusion. Upper abdomen: Colonic diverticulosis without acute diverticulitis. Musculoskeletal: Increased diffuse sclerosis of T3. Surgical clips in the  right breast. IMPRESSION: 1. Increased size of lobulated right apical, heterogeneously enhancing mass, suspicious for primary lung malignancy. Additional imaging evaluation or consultation with Pulmonology or Thoracic Surgery recommended. 2. Surrounding ground-glass and tree-in-bud nodules within the right upper lobe, likely infectious/inflammatory versus lymphangitic spread of disease. 3. Unchanged irregular 1.3 x 0.7 cm nodule in the subpleural left lower lobe. 4. Increased diffuse sclerosis of T3, suspicious for osseous metastatic disease. 5. Aortic Atherosclerosis (ICD10-I70.0) and Emphysema (ICD10-J43.9). Electronically Signed   By: Agustin Cree M.D.   On: 06/26/2023 20:24   MR BRAIN WO CONTRAST  Result Date: 06/26/2023 CLINICAL DATA:  Initial evaluation for neuro deficit, stroke suspected. Left-sided numbness and tingling. EXAM: MRI HEAD WITHOUT CONTRAST TECHNIQUE: Multiplanar, multiecho pulse sequences of the brain and surrounding structures were obtained without intravenous  contrast. COMPARISON:  Prior study from 06/12/2014. FINDINGS: Brain: Mild age-related cerebral atrophy. Mild chronic microvascular ischemic disease for age. No evidence for acute or subacute infarct. No areas of chronic cortical infarction. 1 cm round cystic mass seen involving the right parietal lobe, postcentral gyrus (series 13, image 21). Small focus of associated susceptibility artifact could reflect blood products and/or necrosis. Surrounding T2/FLAIR signal intensity consistent with vasogenic edema. No significant regional mass effect or midline shift. No other visible mass lesion. No hydrocephalus. No extra-axial fluid collection. Pituitary gland suprasellar region within normal limits. Vascular: Major intracranial vascular flow voids are maintained. Skull and upper cervical spine: Craniocervical junction within normal limits. Bone marrow signal intensity normal. No scalp soft tissue abnormality. Sinuses/Orbits: Prior bilateral  ocular lens replacement. Right gaze noted. Paranasal sinuses are clear. No significant mastoid effusion. Other: None. IMPRESSION: 1. 1 cm cystic mass involving the right parietal lobe, postcentral gyrus. Surrounding vasogenic edema without midline shift. Finding could reflect a solitary intracranial metastasis versus a primary CNS neoplasm. Follow-up examination with postcontrast MRI of the brain recommended for complete evaluation. 2. Underlying cerebral atrophy with mild chronic microvascular ischemic disease. Electronically Signed   By: Rise Mu M.D.   On: 06/26/2023 18:05   (Echo, Carotid, EGD, Colonoscopy, ERCP)    Subjective:  Patient resting in bed ordering breakfast anxious to go home she is aware that she needs to follow-up with Dr. [name] on the 19th aware she needs to be n.p.o. after midnight on the 18 Discharge Exam: Vitals:   06/28/23 0427 06/28/23 0806  BP: 129/85   Pulse: 64   Resp: 16   Temp: 97.7 F (36.5 C)   SpO2: 98% 99%   Vitals:   06/27/23 2114 06/28/23 0427 06/28/23 0430 06/28/23 0806  BP: 130/70 129/85    Pulse: 66 64    Resp: 14 16    Temp: 98.6 F (37 C) 97.7 F (36.5 C)    TempSrc: Oral Oral    SpO2: 96% 98%  99%  Weight:   76.3 kg   Height:        General: Pt is alert, awake, not in acute distress Cardiovascular: RRR, S1/S2 +, no rubs, no gallops Respiratory: CTA bilaterally, no wheezing, no rhonchi Abdominal: Soft, NT, ND, bowel sounds + Extremities: no edema, no cyanosis    The results of significant diagnostics from this hospitalization (including imaging, microbiology, ancillary and laboratory) are listed below for reference.     Microbiology: No results found for this or any previous visit (from the past 240 hour(s)).   Labs: BNP (last 3 results) No results for input(s): "BNP" in the last 8760 hours. Basic Metabolic Panel: Recent Labs  Lab 06/26/23 1629 06/27/23 1252  NA 137 136  K 4.3 4.2  CL 92* 96*  CO2 31 28   GLUCOSE 113* 220*  BUN 16 24*  CREATININE 1.24* 1.25*  CALCIUM 9.5 9.1   Liver Function Tests: Recent Labs  Lab 06/27/23 1252  AST 13*  ALT 7  ALKPHOS 65  BILITOT 0.4  PROT 7.2  ALBUMIN 3.2*   No results for input(s): "LIPASE", "AMYLASE" in the last 168 hours. No results for input(s): "AMMONIA" in the last 168 hours. CBC: Recent Labs  Lab 06/26/23 1629 06/27/23 1252  WBC 12.2* 13.0*  HGB 9.6* 9.0*  HCT 31.0* 29.6*  MCV 77.5* 78.3*  PLT 384 386   Cardiac Enzymes: No results for input(s): "CKTOTAL", "CKMB", "CKMBINDEX", "TROPONINI" in the last 168 hours. BNP: Invalid input(s): "  POCBNP" CBG: No results for input(s): "GLUCAP" in the last 168 hours. D-Dimer No results for input(s): "DDIMER" in the last 72 hours. Hgb A1c No results for input(s): "HGBA1C" in the last 72 hours. Lipid Profile No results for input(s): "CHOL", "HDL", "LDLCALC", "TRIG", "CHOLHDL", "LDLDIRECT" in the last 72 hours. Thyroid function studies No results for input(s): "TSH", "T4TOTAL", "T3FREE", "THYROIDAB" in the last 72 hours.  Invalid input(s): "FREET3" Anemia work up No results for input(s): "VITAMINB12", "FOLATE", "FERRITIN", "TIBC", "IRON", "RETICCTPCT" in the last 72 hours. Urinalysis    Component Value Date/Time   COLORURINE YELLOW 10/06/2016 1423   APPEARANCEUR CLEAR 10/06/2016 1423   LABSPEC 1.011 10/06/2016 1423   PHURINE 6.0 10/06/2016 1423   GLUCOSEU NEGATIVE 10/06/2016 1423   HGBUR TRACE (A) 10/06/2016 1423   HGBUR small 05/13/2010 1505   BILIRUBINUR negative 12/08/2019 0000   BILIRUBINUR NEG 12/29/2015 1444   KETONESUR negative 12/08/2019 0000   KETONESUR NEGATIVE 10/06/2016 1423   PROTEINUR negative 12/08/2019 0000   PROTEINUR NEGATIVE 10/06/2016 1423   UROBILINOGEN 0.2 12/08/2019 0000   UROBILINOGEN 0.2 04/17/2015 1618   NITRITE Negative 12/08/2019 0000   NITRITE NEGATIVE 10/06/2016 1423   LEUKOCYTESUR Negative 12/08/2019 0000   Sepsis Labs Recent Labs  Lab  06/26/23 1629 06/27/23 1252  WBC 12.2* 13.0*   Microbiology No results found for this or any previous visit (from the past 240 hour(s)).   Time coordinating discharge: 39 minutes  SIGNED:   Alwyn Ren, MD  [facility] 06/28/2023, 5:42 PM

## 2023-07-05 ENCOUNTER — Other Ambulatory Visit: Payer: Self-pay

## 2023-07-05 ENCOUNTER — Encounter (HOSPITAL_COMMUNITY): Payer: Self-pay | Admitting: Emergency Medicine

## 2023-07-05 NOTE — Progress Notes (Signed)
Spoke with pt for pre-op call. Pt denies cardiac history and Diabetes. Pt is treated for HTN and COPD.   Shower instructions given to pt and she voiced understanding.

## 2023-07-09 ENCOUNTER — Ambulatory Visit (HOSPITAL_COMMUNITY)
Admission: RE | Admit: 2023-07-09 | Discharge: 2023-07-09 | Disposition: A | Discharge status: Home / Self Care | Payer: 59 | Attending: Emergency Medicine | Admitting: Emergency Medicine

## 2023-07-09 ENCOUNTER — Encounter (HOSPITAL_COMMUNITY): Payer: Self-pay | Admitting: Emergency Medicine

## 2023-07-09 ENCOUNTER — Other Ambulatory Visit: Payer: Self-pay

## 2023-07-09 ENCOUNTER — Other Ambulatory Visit: Payer: Self-pay | Admitting: Emergency Medicine

## 2023-07-09 ENCOUNTER — Ambulatory Visit (HOSPITAL_BASED_OUTPATIENT_CLINIC_OR_DEPARTMENT_OTHER): Payer: 59

## 2023-07-09 ENCOUNTER — Encounter (HOSPITAL_COMMUNITY)
Admission: RE | Disposition: A | Discharge status: Home / Self Care | Payer: Self-pay | Source: Home / Self Care | Attending: Emergency Medicine

## 2023-07-09 ENCOUNTER — Ambulatory Visit (HOSPITAL_COMMUNITY): Payer: 59

## 2023-07-09 DIAGNOSIS — R918 Other nonspecific abnormal finding of lung field: Secondary | ICD-10-CM | POA: Diagnosis present

## 2023-07-09 DIAGNOSIS — C3411 Malignant neoplasm of upper lobe, right bronchus or lung: Secondary | ICD-10-CM | POA: Insufficient documentation

## 2023-07-09 DIAGNOSIS — D869 Sarcoidosis, unspecified: Secondary | ICD-10-CM | POA: Diagnosis present

## 2023-07-09 DIAGNOSIS — J9611 Chronic respiratory failure with hypoxia: Secondary | ICD-10-CM | POA: Diagnosis not present

## 2023-07-09 DIAGNOSIS — K219 Gastro-esophageal reflux disease without esophagitis: Secondary | ICD-10-CM | POA: Insufficient documentation

## 2023-07-09 DIAGNOSIS — I10 Essential (primary) hypertension: Secondary | ICD-10-CM | POA: Insufficient documentation

## 2023-07-09 DIAGNOSIS — Z7952 Long term (current) use of systemic steroids: Secondary | ICD-10-CM | POA: Diagnosis not present

## 2023-07-09 DIAGNOSIS — D860 Sarcoidosis of lung: Secondary | ICD-10-CM | POA: Diagnosis not present

## 2023-07-09 DIAGNOSIS — E669 Obesity, unspecified: Secondary | ICD-10-CM | POA: Diagnosis not present

## 2023-07-09 DIAGNOSIS — Z87891 Personal history of nicotine dependence: Secondary | ICD-10-CM

## 2023-07-09 DIAGNOSIS — J439 Emphysema, unspecified: Secondary | ICD-10-CM | POA: Diagnosis not present

## 2023-07-09 DIAGNOSIS — Z853 Personal history of malignant neoplasm of breast: Secondary | ICD-10-CM | POA: Insufficient documentation

## 2023-07-09 DIAGNOSIS — D573 Sickle-cell trait: Secondary | ICD-10-CM | POA: Diagnosis not present

## 2023-07-09 DIAGNOSIS — Z923 Personal history of irradiation: Secondary | ICD-10-CM | POA: Diagnosis not present

## 2023-07-09 DIAGNOSIS — Z6829 Body mass index (BMI) 29.0-29.9, adult: Secondary | ICD-10-CM | POA: Diagnosis not present

## 2023-07-09 DIAGNOSIS — J440 Chronic obstructive pulmonary disease with acute lower respiratory infection: Secondary | ICD-10-CM

## 2023-07-09 HISTORY — PX: VIDEO BRONCHOSCOPY WITH RADIAL ENDOBRONCHIAL ULTRASOUND: SHX6849

## 2023-07-09 HISTORY — DX: Depression, unspecified: F32.A

## 2023-07-09 HISTORY — PX: BRONCHIAL BIOPSY: SHX5109

## 2023-07-09 HISTORY — PX: BRONCHIAL BRUSHINGS: SHX5108

## 2023-07-09 HISTORY — PX: BRONCHIAL NEEDLE ASPIRATION BIOPSY: SHX5106

## 2023-07-09 SURGERY — BRONCHOSCOPY, WITH BIOPSY USING ELECTROMAGNETIC NAVIGATION
Anesthesia: General | Laterality: Right

## 2023-07-09 MED ORDER — ONDANSETRON HCL 4 MG/2ML IJ SOLN: 4.0000 mg | Freq: Once | INTRAMUSCULAR | Status: DC | PRN

## 2023-07-09 MED ORDER — DEXAMETHASONE SODIUM PHOSPHATE 10 MG/ML IJ SOLN
INTRAMUSCULAR | Status: DC | PRN
  Administered 2023-07-09: 10 mg via INTRAVENOUS

## 2023-07-09 MED ORDER — FENTANYL CITRATE (PF) 100 MCG/2ML IJ SOLN: 25.0000 ug | INTRAMUSCULAR | Status: DC | PRN

## 2023-07-09 MED ORDER — ROCURONIUM BROMIDE 10 MG/ML (PF) SYRINGE
PREFILLED_SYRINGE | INTRAVENOUS | Status: DC | PRN
  Administered 2023-07-09: 50 mg via INTRAVENOUS

## 2023-07-09 MED ORDER — CHLORHEXIDINE GLUCONATE 0.12 % MT SOLN
OROMUCOSAL | Status: AC
  Administered 2023-07-09: 15 mL
  Filled 2023-07-09: qty 15

## 2023-07-09 MED ORDER — ACETAMINOPHEN 500 MG PO TABS
1000.0000 mg | ORAL_TABLET | Freq: Once | ORAL | Status: AC
  Administered 2023-07-09: 1000 mg via ORAL
  Filled 2023-07-09: qty 2

## 2023-07-09 MED ORDER — PROPOFOL 10 MG/ML IV BOLUS
INTRAVENOUS | Status: DC | PRN
Start: 2023-07-09 — End: 2023-07-09
  Administered 2023-07-09: 140 mg via INTRAVENOUS

## 2023-07-09 MED ORDER — PHENYLEPHRINE HCL-NACL 20-0.9 MG/250ML-% IV SOLN
INTRAVENOUS | Status: DC | PRN
  Administered 2023-07-09: 20 ug/min via INTRAVENOUS

## 2023-07-09 MED ORDER — LIDOCAINE 2% (20 MG/ML) 5 ML SYRINGE
INTRAMUSCULAR | Status: DC | PRN
  Administered 2023-07-09: 80 mg via INTRAVENOUS

## 2023-07-09 MED ORDER — MIDAZOLAM HCL 2 MG/2ML IJ SOLN
INTRAMUSCULAR | Status: DC | PRN
  Administered 2023-07-09: 2 mg via INTRAVENOUS

## 2023-07-09 MED ORDER — FENTANYL CITRATE (PF) 250 MCG/5ML IJ SOLN
INTRAMUSCULAR | Status: DC | PRN
  Administered 2023-07-09: 50 ug via INTRAVENOUS

## 2023-07-09 MED ORDER — PHENYLEPHRINE 80 MCG/ML (10ML) SYRINGE FOR IV PUSH (FOR BLOOD PRESSURE SUPPORT)
PREFILLED_SYRINGE | INTRAVENOUS | Status: DC | PRN
  Administered 2023-07-09 (×3): 160 ug via INTRAVENOUS

## 2023-07-09 MED ORDER — PROPOFOL 500 MG/50ML IV EMUL
INTRAVENOUS | Status: DC | PRN
  Administered 2023-07-09: 120 ug/kg/min via INTRAVENOUS

## 2023-07-09 MED ORDER — SUGAMMADEX SODIUM 200 MG/2ML IV SOLN
INTRAVENOUS | Status: DC | PRN
  Administered 2023-07-09: 200 mg via INTRAVENOUS

## 2023-07-09 MED ORDER — METOPROLOL TARTRATE 25 MG PO TABS: 25.0000 mg | ORAL_TABLET | Freq: Every day | ORAL | Status: DC

## 2023-07-09 MED ORDER — LACTATED RINGERS IV SOLN: INTRAVENOUS | Status: DC

## 2023-07-09 MED ORDER — ONDANSETRON HCL 4 MG/2ML IJ SOLN
INTRAMUSCULAR | Status: DC | PRN
  Administered 2023-07-09: 4 mg via INTRAVENOUS

## 2023-07-09 NOTE — Transfer of Care (Signed)
Immediate Anesthesia Transfer of Care Note  Patient: [name]  Procedure(s) Performed: ROBOTIC ASSISTED NAVIGATIONAL BRONCHOSCOPY (Right) BRONCHIAL BIOPSIES BRONCHIAL NEEDLE ASPIRATION BIOPSIES BRONCHIAL BRUSHINGS VIDEO BRONCHOSCOPY WITH RADIAL ENDOBRONCHIAL ULTRASOUND  Patient Location: PACU  Anesthesia Type:General  Level of Consciousness: drowsy  Airway & Oxygen Therapy: Patient Spontanous Breathing and Patient connected to face mask oxygen  Post-op Assessment: Report given to RN and Post -op Vital signs reviewed and stable  Post vital signs: Reviewed and stable  Last Vitals:  Vitals Value Taken Time  BP 122/74 07/09/23 0953  Temp    Pulse 75 07/09/23 0954  Resp 18 07/09/23 0954  SpO2 100 % 07/09/23 0954  Vitals shown include unfiled device data.  Last Pain:  Vitals:   07/09/23 0742  TempSrc:   PainSc: 0-No pain         Complications: No notable events documented.

## 2023-07-09 NOTE — Op Note (Signed)
Video Bronchoscopy with Robotic Assisted Bronchoscopic Navigation   Date of Operation: 07/09/2023   Pre-op Diagnosis: Right upper lobe mass  Post-op Diagnosis: Same  Surgeon: Levy Pupa  Assistants: None  Anesthesia: General endotracheal anesthesia  Operation: Flexible video fiberoptic bronchoscopy with robotic assistance and biopsies.  Estimated Blood Loss: Minimal  Complications: None  Indications and History: [name] is a 62 y.o. female with history of breast cancer, former tobacco use, sarcoidosis (diagnosed by transbronchial biopsies 1992).  Found to have a new brain mass, new right upper lobe mass and left lower lobe pulmonary nodule all concerning for primary lung cancer with metastatic disease.  Recommendation made to achieve a tissue diagnosis via robotic assisted navigational bronchoscopy.  The risks, benefits, complications, treatment options and expected outcomes were discussed with the patient.  The possibilities of pneumothorax, pneumonia, reaction to medication, pulmonary aspiration, perforation of a viscus, bleeding, failure to diagnose a condition and creating a complication requiring transfusion or operation were discussed with the patient who freely signed the consent.    Description of Procedure: The patient was seen in the Preoperative Area, was examined and was deemed appropriate to proceed.  The patient was taken to [facility] endoscopy room 3, identified as [name] and the procedure verified as Flexible Video Fiberoptic Bronchoscopy.  A Time Out was held and the above information confirmed.   Prior to the date of the procedure a high-resolution CT scan of the chest was performed. Utilizing ION software program a virtual tracheobronchial tree was generated to allow the creation of distinct navigation pathways to the patient's parenchymal abnormalities. After being taken to the operating room general anesthesia was initiated and the patient  was orally  intubated. The video fiberoptic bronchoscope was introduced via the endotracheal tube and a general inspection was performed which showed normal right and left lung anatomy. Aspiration of the bilateral mainstems was completed to remove any remaining secretions. Robotic catheter inserted into patient's endotracheal tube.   Target #1 right upper lobe mass: The distinct navigation pathways prepared prior to this procedure were then utilized to navigate to patient's lesion identified on CT scan. The robotic catheter was secured into place and the vision probe was withdrawn.  Lesion location was approximated using fluoroscopy and radial endobronchial ultrasound for peripheral targeting. Under fluoroscopic guidance transbronchial needle brushings, transbronchial needle biopsies, and transbronchial forceps biopsies were performed to be sent for cytology and pathology.  At the end of the procedure a general airway inspection was performed and there was no evidence of active bleeding. The bronchoscope was removed.  The patient tolerated the procedure well. There was no significant blood loss and there were no obvious complications. A post-procedural chest x-ray is pending.  Samples Target #1: 1. Transbronchial needle brushings from right upper lobe mass 2. Transbronchial Wang needle biopsies from right upper lobe mass 3. Transbronchial forceps biopsies from right upper lobe mass   Plans:  The patient will be discharged from the PACU to home when recovered from anesthesia and after chest x-ray is reviewed. We will review the cytology, pathology and microbiology results with the patient when they become available. Outpatient followup will be with Dr. [name] and with Oncology.    Levy Pupa, MD, PhD 07/09/2023, 9:47 AM [facility] [phone] or if no answer before 7:00PM call [phone] For any issues after 7:00PM please call eLink [phone]

## 2023-07-09 NOTE — Anesthesia Procedure Notes (Signed)
Procedure Name: Intubation Date/Time: 07/09/2023 9:04 AM  Performed by: Nadara Mustard, CRNAPre-anesthesia Checklist: Patient identified, Emergency Drugs available, Suction available and Patient being monitored Patient Re-evaluated:Patient Re-evaluated prior to induction Oxygen Delivery Method: Circle system utilized Preoxygenation: Pre-oxygenation with 100% oxygen Induction Type: IV induction Ventilation: Mask ventilation without difficulty Laryngoscope Size: Mac and 4 Grade View: Grade I Tube type: Oral Tube size: 8.5 mm Number of attempts: 1 Airway Equipment and Method: Stylet and Oral airway Placement Confirmation: ETT inserted through vocal cords under direct vision, positive ETCO2 and breath sounds checked- equal and bilateral Secured at: 1 cm Tube secured with: Tape Dental Injury: Teeth and Oropharynx as per pre-operative assessment

## 2023-07-09 NOTE — Anesthesia Preprocedure Evaluation (Signed)
Anesthesia Evaluation  Patient identified by MRN, date of birth, ID band Patient awake    Reviewed: Allergy & Precautions, NPO status , Patient's Chart, lab work & pertinent test results, reviewed documented beta blocker date and time   Airway Mallampati: II  TM Distance: >3 FB Neck ROM: Full    Dental  (+) Dental Advisory Given, Edentulous Lower, Edentulous Upper   Pulmonary COPD,  COPD inhaler and oxygen dependent, former smoker Pulmonary sarcoidosis  Right lung mass    Pulmonary exam normal breath sounds clear to auscultation       Cardiovascular hypertension, Pt. on home beta blockers Normal cardiovascular exam Rhythm:Regular Rate:Normal     Neuro/Psych  PSYCHIATRIC DISORDERS Anxiety Depression    negative neurological ROS     GI/Hepatic Neg liver ROS,GERD  Medicated,,  Endo/Other  negative endocrine ROS    Renal/GU negative Renal ROS     Musculoskeletal negative musculoskeletal ROS (+)    Abdominal   Peds  Hematology  (+) Blood dyscrasia, Sickle cell trait and anemia   Anesthesia Other Findings Day of surgery medications reviewed with the patient.  Right breast cancer   Reproductive/Obstetrics                             Anesthesia Physical Anesthesia Plan  ASA: 4  Anesthesia Plan: General   Post-op Pain Management: Tylenol PO (pre-op)*   Induction: Intravenous  PONV Risk Score and Plan: 3 and Dexamethasone and Ondansetron  Airway Management Planned: Oral ETT  Additional Equipment:   Intra-op Plan:   Post-operative Plan: Extubation in OR  Informed Consent: I have reviewed the patients History and Physical, chart, labs and discussed the procedure including the risks, benefits and alternatives for the proposed anesthesia with the patient or authorized representative who has indicated his/her understanding and acceptance.     Dental advisory given  Plan Discussed  with: CRNA  Anesthesia Plan Comments:        Anesthesia Quick Evaluation

## 2023-07-09 NOTE — Discharge Instructions (Signed)
Flexible Bronchoscopy, Care After This sheet gives you information about how to care for yourself after your test. Your doctor may also give you more specific instructions. If you have problems or questions, contact your doctor. Follow these instructions at home: Eating and drinking When your numbness is gone and your cough and gag reflexes have come back, you may: Eat only soft foods. Slowly drink liquids. The day after the test, go back to your normal diet. Driving Do not drive for 24 hours if you were given a medicine to help you relax (sedative). Do not drive or use heavy machinery while taking prescription pain medicine. General instructions  Take over-the-counter and prescription medicines only as told by your doctor. Return to your normal activities as told. Ask what activities are safe for you. Do not use any products that have nicotine or tobacco in them. This includes cigarettes and e-cigarettes. If you need help quitting, ask your doctor. Keep all follow-up visits as told by your doctor. This is important. It is very important if you had a tissue sample (biopsy) taken. Get help right away if: You have shortness of breath that gets worse. You get light-headed. You feel like you are going to pass out (faint). You have chest pain. You cough up: More than a little blood. More blood than before. Summary Do not eat or drink anything (not even water) for 2 hours after your test, or until your numbing medicine wears off. Do not use cigarettes. Do not use e-cigarettes. Get help right away if you have chest pain.  Please call our office for any questions or concerns.  [phone].  This information is not intended to replace advice given to you by your health care provider. Make sure you discuss any questions you have with your health care provider. Document Released: 08/27/2009 Document Revised: 10/12/2017 Document Reviewed: 11/17/2016 Elsevier Patient Education  2020 Elsevier  Inc.  

## 2023-07-09 NOTE — Progress Notes (Signed)
Need to establish office visit with Dr. Pamelia Hoit

## 2023-07-09 NOTE — Interval H&P Note (Signed)
History and Physical Interval Note:  07/09/2023 8:37 AM  [name]  has presented today for surgery, with the diagnosis of right lung mass.  The various methods of treatment have been discussed with the patient and family. After consideration of risks, benefits and other options for treatment, the patient has consented to  Procedure(s): ROBOTIC ASSISTED NAVIGATIONAL BRONCHOSCOPY (Right) as a surgical intervention.  The patient's history has been reviewed, patient examined, no change in status, stable for surgery.  I have reviewed the patient's chart and labs.  Questions were answered to the patient's satisfaction.     Leslye Peer

## 2023-07-10 ENCOUNTER — Encounter (HOSPITAL_COMMUNITY): Payer: Self-pay | Admitting: Emergency Medicine

## 2023-07-10 NOTE — Anesthesia Postprocedure Evaluation (Signed)
Anesthesia Post Note  Patient: JANASHA SULJIC  Procedure(s) Performed: ROBOTIC ASSISTED NAVIGATIONAL BRONCHOSCOPY (Right) BRONCHIAL BIOPSIES BRONCHIAL NEEDLE ASPIRATION BIOPSIES BRONCHIAL BRUSHINGS VIDEO BRONCHOSCOPY WITH RADIAL ENDOBRONCHIAL ULTRASOUND     Patient location during evaluation: PACU Anesthesia Type: General Level of consciousness: awake and alert Pain management: pain level controlled Vital Signs Assessment: post-procedure vital signs reviewed and stable Respiratory status: spontaneous breathing, nonlabored ventilation, respiratory function stable and patient connected to nasal cannula oxygen Cardiovascular status: blood pressure returned to baseline and stable Postop Assessment: no apparent nausea or vomiting Anesthetic complications: no   No notable events documented.  Last Vitals:  Vitals:   07/09/23 1045 07/09/23 1050  BP: (!) 92/54   Pulse: 69 73  Resp: 11 13  Temp:  (!) 36.1 C  SpO2: 96% 95%    Last Pain:  Vitals:   07/09/23 1045  TempSrc:   PainSc: 0-No pain                 Collene Schlichter

## 2023-07-11 LAB — CYTOLOGY - NON PAP

## 2023-07-13 ENCOUNTER — Telehealth: Payer: Self-pay | Admitting: Emergency Medicine

## 2023-07-13 NOTE — Telephone Encounter (Signed)
I discussed the bronchitis results with the patient by phone today.  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  I asked the office to get her set up with Dr. Pamelia Hoit with whom she has already worked in the past.  It does not look like that appointment has been set up yet.  She has an office visit with Dr. [name] next week.  I did ask her to discuss the oncology follow-up with Dr. [name] if the appointment does not get made before they see each other.   FYI to Dr. [name]

## 2023-07-16 NOTE — Progress Notes (Unsigned)
Subjective:    Patient ID: [name], female   DOB: 1961-06-30    MRN: [id number]    Brief patient profile:  62  yobf quit smoking 10/2004 dx by transbronchial biopsy 5/92 with NCG  consistent with sarcoid. Since that time she's been off and on prednisone multiple  times with flares of coughing dyspnea and skin involvement > weaned off chronic prednisone Feb 2012 and placed back on 10/10/16 for deteriorating obstructive changes on pfts  With resp failure/ 02 dep        History of Present Illness  02/20/2017  f/u ov/Calbert Hulsebus re:   GOLD III/ pred 10 mg daily and bevespi 2bid  and alb rare  Chief Complaint  Patient presents with   Follow-up    6wk rov. pt states breathing is baseline. pt reports of sob with exertion & occ prod cough with green mucus mainly in the morning   Doe better :  [facility] = can't walk a nl pace on a flat grade s sob but does fine slow and flat eg shopping on  2lpm / ok at rest s 02  Uses 02 2lpm sleeping  rec Drop prednisone to 10 mg one half daily       11/26/2017  f/u ov/[name] re:  Sarcoid/skin involvement chronic cough ? Sinus dz/ using med calendar well / pednisone 10 mg daily  Chief Complaint  Patient presents with   Follow-up    Cough has improved some, but still producing some greenish yellow sputum first thing in the am.  She has not had to use her albuterol inhaler. She c/o shakiness in her hands for the past couple of months.    less cough p augmentin but still am sputum green / no noct symptoms Doing well with rehab but sometimes has to turn 02 up to 4 lpm there but doesn't do so in other settings and concerned about wt gain  rec Start plaquenil 200 mg daily  Ok to adjust the prednisone 5mg   from 1-2 pills in am as per med calendar See calendar for specific medication instructions   11/22/2018  f/u ov/[name] re:  Sarcooid/ copd/ 02 dep resp failure/ indolent onset x 3  weeks no appetite fatigue  Increased sob on pred 5 mg daily / no med calendar  Chief  Complaint  Patient presents with   Follow-up    Pt c/o increased SOB and cough the past few days. Her cough is prod at times with yellow to clear sputum. She states her mouth is very dry in the mornings. Her appetite is poor and she is down 9 lbs since the last visit. She is using her albuterol inhaler 2 x per wk on average.    Dyspnea:  mb and back flat ok on 3lpm Pulsed when walks  Cough: none Sleeping: on side bed is horizontal SABA use: couple of times a week 02: 2lpm and up to 3lpm walk rec Please remember to go to the lab department   for your tests - we will call you with the results when they are available. 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 She uses her albuterol inhaler about 1-2 x per week. She rarely uses neb. She has been having occ cough, esp in the am with thick, green sputum.   Dyspnea:  grocery shopping [employer] with sats  typically above 90% on 2 POC  Cough: green x one week, getting better with increase pred to 5 mg from 5-2.5-5  Sleeping: bed is flat, 2 pillows  SABA use: as above 02: 2lpm Covid status:   vax x 2, never infected  Mouth dry "feels like a rash"  Rec Zpak should clear the green mucus  When breathing / cough bad > prednisone  5 mg x 2 until better then work back to 2.5 mg daily  Make sure you check your oxygen saturation  at your highest level of  activity Get the new booster for covid 19    03/14/2022  f/u ov/[name] re: sarcoidosis/ copd gold 4 02 dep    maint on stiolto 2 puffs each am   Chief Complaint  Patient presents with   Follow-up    ER in April, saw a nodule.  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 / not using [employer] parking  Cough: ok presently  Sleeping: flat bed 2 pillows  SABA use: once or twice a week  02: 2lpm hs ,  walking does 3lpm POC Rec Ceiling for prednisone is 10 mg daily and floor is 2.5 mg  Make sure you check your oxygen saturation  AT  your highest level of activity (not after you stop)     Please schedule a follow up visit in 3 months but call sooner if needed    07/17/2023 3 m  f/u ov/[name] re: sarcoid ? Met ca ?    maint on ***  No chief complaint on file.   Dyspnea:  *** Cough: *** Sleeping: *** resp cc  SABA use: *** 02: ***  Lung cancer screening :  ***    No obvious day to day or daytime variability or assoc excess/ purulent sputum or mucus plugs or hemoptysis or cp or chest tightness, subjective wheeze or overt sinus or hb symptoms.    Also denies any obvious fluctuation of symptoms with weather or environmental changes or other aggravating or alleviating factors except as outlined above   No unusual exposure hx or h/o childhood pna/ asthma or knowledge of premature birth.  Current Allergies, Complete Past Medical History, Past Surgical History, Family History, and Social History were reviewed in [employer] record.  ROS  The following are not active complaints unless bolded Hoarseness, sore throat, dysphagia, dental problems, itching, sneezing,  nasal congestion or discharge of excess mucus or purulent secretions, ear ache,   fever, chills, sweats, unintended wt loss or wt gain, classically pleuritic or exertional cp,  orthopnea pnd or arm/hand swelling  or leg swelling, presyncope, palpitations, abdominal pain, anorexia, nausea, vomiting, diarrhea  or  change in bowel habits or change in bladder habits, change in stools or change in urine, dysuria, hematuria,  rash, arthralgias, visual complaints, headache, numbness, weakness or ataxia or problems with walking or coordination,  change in mood or  memory.        No outpatient medications have been marked as taking for the 07/17/23  encounter (Appointment) with Nyoka Cowden, MD.                    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 Marga Melnick  -h/o Daily prednisone since 2007   > off completely Feb 2012 with no problem> restarted  09/2016  ? 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 Some better but no worse off 04/2011 - Rehab started mid aug 2018  BACK PAIN, LOW (ICD-724.2)  ANEMIA, IRON DEFICIENCY, UNSPEC. (ICD-280.9)  Health Maintenance.......................................................   [facility]   Family History:  crohn`s in daughter, M-lupus, htn, asthma,  no Ca, DM, CAD    Social History:   lives with twin children and grandson. single; >20 pack year history, quit in 2005; no EtOH  Laid off  from works for [employer] Sept 2013           Objective:   Physical Exam  Wts  07/17/2023      ***  03/14/2022     199  07/28/2021    205  08/26/2020  219  11/17/2019      223   12/30/2018  188  wt 166 Oct 12, 2009 >170 October 28, 2010 > 169 06/29/2011 >>171 08/24/2011 > 01/15/2012  171 > 04/24/2012 176 > 06/28/2012  175> 08/13/2012 >  08/30/2012  173 > 170 11/28/2012 > 01/29/2013  176 > 174 >173 06/16/2013 > 171 09/18/2013 > 12/19/2013 177 >179 04/13/2014>  04/23/2014 177 > 08/21/2014 184 >182 11/27/2014 > 05/12/2015 177  > 05/11/2016   177 > 10/27/2016  175 > 11/09/2016 182 > 01/09/2017 198 > 02/20/2017  206 >  07/17/2017   209 >  09/03/2017   210 > 09/28/2017  206  > 11/26/2017   209 > 01/10/2018  210> 02/19/2018  206> 05/21/2018  198 > 08/06/2018  197 > 08/21/2018  196 > 11/22/2018  187    Vital signs reviewed  07/17/2023  - Note at rest 02 sats  ***% on ***   General appearance:    ***     Mod bar***              Assessment:

## 2023-07-17 ENCOUNTER — Ambulatory Visit (INDEPENDENT_AMBULATORY_CARE_PROVIDER_SITE_OTHER): Payer: 59 | Admitting: Internal Medicine

## 2023-07-17 ENCOUNTER — Encounter: Payer: Self-pay | Admitting: Internal Medicine

## 2023-07-17 VITALS — BP 124/70 | HR 120 | Temp 100.0°F | Ht 65.0 in | Wt 164.2 lb

## 2023-07-17 DIAGNOSIS — D869 Sarcoidosis, unspecified: Secondary | ICD-10-CM | POA: Diagnosis not present

## 2023-07-17 DIAGNOSIS — C3491 Malignant neoplasm of unspecified part of right bronchus or lung: Secondary | ICD-10-CM | POA: Diagnosis not present

## 2023-07-17 DIAGNOSIS — J449 Chronic obstructive pulmonary disease, unspecified: Secondary | ICD-10-CM

## 2023-07-17 DIAGNOSIS — J9611 Chronic respiratory failure with hypoxia: Secondary | ICD-10-CM

## 2023-07-17 DIAGNOSIS — J9612 Chronic respiratory failure with hypercapnia: Secondary | ICD-10-CM

## 2023-07-17 MED ORDER — DEXAMETHASONE 4 MG PO TABS: ORAL_TABLET | ORAL | 2 refills | Status: DC

## 2023-07-17 NOTE — Assessment & Plan Note (Signed)
Navigational Bx  07/09/23  RUL > referred to oncology by Dr [name]  done again 07/17/2023   Head met would be unusual with sq cell ca of lung and she also has h/o breast ca and sarcoid so PET scan may not tell us much here > will await oncology opinion prior to further w/u and in meantime:  Dex 4 mg bid with meals until feeling better then 4 mg q am.   Discussed in detail all the  indications, usual  risks and alternatives  relative to the benefits with patient who agrees to proceed with Rx as outlined.

## 2023-07-17 NOTE — Assessment & Plan Note (Addendum)
New start 09/2016 at admit see records - 10/18/16 Patient Saturations on Room Air at Rest = 92% Patient Saturations on Room Air while Ambulating = 84% Patient Saturations on 2 Liters of oxygen while Ambulating = 90% - HCO3  11/09/2016 = 39  - HCO3   03/16/17        = 34  - HC03    01/10/2018  = 40  - HC03  11/22/2018     = 38  - HC03  12/20/2018       = 26  - HC03  11/17/2019       = 37  11/17/2019 Patient Saturations on Room Air at Rest = 97% Patient Saturations on Room Air while Ambulating = 79% Patient Saturations on 2 Liters of oxygen while Ambulating = 90% - 08/18/2022   Walked on 2lpm Pulsed  x  one  lap(s) =  approx 250  ft  @ mod pace, stopped due to hip pain  with lowest 02 sats 89% improved on 3lpm pulsed   As of 07/17/2023 2lpm hs and up to 3lpm with activity          Each maintenance medication was reviewed in detail including emphasizing most importantly the difference between maintenance and prns and under what circumstances the prns are to be triggered using an action plan format where appropriate.  Total time for H and P, chart review, counseling, reviewing hfa/ smi/ 02/ pulse ox  device(s) and generating customized AVS unique to this office visit / same day charting  > 30 min post hosp f/u ov

## 2023-07-17 NOTE — Assessment & Plan Note (Signed)
Quit smoking 2005  - PFT's 06/26/08 FEV1 1.09 ratio  - PFTs 03/02/10 FEV1 1.06 ratio 34    PFT's 08/01/2011 FEV1 1.6 (42%)  41 ratio  DLCO 55 corrects 77 - Spiriva trial March 02, 2010 > ? better but not worse off it 04/2011 - 05/12/2015 p extensive coaching HFA effectiveness =    90%  - Referred to Rehab 08/21/2014 > could not arrange due to schedule  -med calendar 06/16/2013 > did not bring to office as requested 12/19/13 or 04/23/14  Or 08/21/2014  - 11/09/2016    changed symb to bevespi since taking chronic pred for pf    - 02/20/2017 decreased pred to 5 mg daily > weaned to 2.5 mg qd as of 07/17/2017  - started rehab mid Aug 2018  - 07/17/2017 changed to pred 2.5 even days > did not tolerate so changed  Around  07/28/17 back to 5 mg qod  - Spirometry 09/03/2017  FEV1 0.52 (23%)  Ratio 40 with typical curvature   - PFT's  02/19/2018  FEV1 0.65 (29 % ) ratio 43  p 1 % improvement from saba p stiolto/ pred 5 mg  prior to study with DLCO  39 % corrects to 56  % for alv volume - 12/30/2018  After extensive coaching inhaler device,  effectiveness =    95%   With smi > continue stiolto   Pt is Group B in terms of symptom/risk and laba/lama therefore appropriate rx at this point >>>  continue stiolto 2 each am and approp saba

## 2023-07-17 NOTE — Patient Instructions (Signed)
Decadron 4 mg twice daily until better then 1 daily take with meals   My office will be contacting you by phone for referral to Oncology   - if you don't hear back from my office within one week please call us back or notify us thru MyChart and we'll address it right away.   Please schedule a follow up visit in 3 months but call sooner if needed

## 2023-07-17 NOTE — Assessment & Plan Note (Signed)
Positive transbronchial biopsy 02/19/91 by Dr. Marga Melnick  -Daily prednisone since 2007 with ? adrenal insufficiency iatrogenic > off completely Feb 2012 with no recurrence - PFT's 06/26/08 FEV1  1.09 ratio  - PFTs  03/02/10 FEV1  1.06 (42%) ratio 34 and no better p B2,  DLC0 48% corrects to 64% - PFT's 08/01/2011 FEV1 1.6 (42%)  41 ratio  DLCO 55 corrects 77 - PFTs 10/10/16    FEV1  0.87(38)ratio 41 and DLCO 36/39c corrects to 51 for alv vol > placed back on daily pred  - 02/20/2017 decreased pred to 5 mg daily > to 2.5mg  daily as of 07/17/2017  - 07/17/2017 try 2.5 mg qod > 09/28/2017 changed to 20 mg until better and new floor of 5 mg daily plus consider plaquenil due to new skin involvement on face  - opth eval neg 11/10/17 - Start plaquenil 200 mg daily 11/26/2017  - 01/10/2018 ESR up to 97 with pred taper so rec bid plaquenil > could only tol 200 mg daily  - 02/19/2018 ESR = 94 on 5mg  daily  - 05/21/2018 referred to dermatology  - 08/21/2018   ESR = 80  On prednisone 10 mg/ plaquenil 200 mg daily > tapered to floor of 5 mg daily  - 11/22/2018  ESR  = 113 on pred 5 mg and plaq 200 mg with calcium 12.1 so rec pred 20 mg ub then new floor of 10 mg daily  - 02/24/2019 clinically improved and doing fine on Plaquenil 200 plus pred  10 mg daily so rec rechalleng with slow taper to 5 mg daily   - 07/28/21   ACE  Level  38 (down from 45 x 4 y prior )  Rx for likely head met will be much higher than prev sarcoid rx  (see separate a/p) but explained wheterh dex or pred should always be on an effective floor of steroids to prevent addisonian crisis

## 2023-07-19 ENCOUNTER — Other Ambulatory Visit: Payer: Self-pay | Admitting: Radiation Therapy

## 2023-07-19 ENCOUNTER — Telehealth: Payer: Self-pay | Admitting: Radiation Therapy

## 2023-07-19 DIAGNOSIS — C7931 Secondary malignant neoplasm of brain: Secondary | ICD-10-CM

## 2023-07-19 NOTE — Telephone Encounter (Signed)
I spoke with the patient and her daughter, [name], about her upcoming brain MRI and consult with Dr. [name]. [name] is an oncology nurse and plans to attend the consult with her mother. One of her other daughters will take her to the brain MRI appointment.   Jalene Mullet R.T.(R)(T) Radiation Special Procedures Navigator

## 2023-07-20 ENCOUNTER — Other Ambulatory Visit: Payer: Self-pay

## 2023-07-20 ENCOUNTER — Ambulatory Visit (HOSPITAL_COMMUNITY)
Admission: RE | Admit: 2023-07-20 | Discharge: 2023-07-20 | Disposition: A | Discharge status: Home / Self Care | Payer: 59 | Source: Ambulatory Visit | Attending: Radiation Oncology | Admitting: Radiation Oncology

## 2023-07-20 DIAGNOSIS — C7931 Secondary malignant neoplasm of brain: Secondary | ICD-10-CM | POA: Diagnosis present

## 2023-07-20 MED ORDER — GADOBUTROL 1 MMOL/ML IV SOLN
7.0000 mL | Freq: Once | INTRAVENOUS | Status: AC | PRN
  Administered 2023-07-20: 7 mL via INTRAVENOUS

## 2023-07-20 NOTE — Progress Notes (Signed)
The proposed treatment discussed in conference is for discussion purpose only and is not a binding recommendation.  The patients have not been physically examined, or presented with their treatment options.  Therefore, final treatment plans cannot be decided.  

## 2023-07-20 NOTE — Progress Notes (Signed)
Location/Histology of Brain Tumor: Right postcentral gyrus    07/20/2023 Dr. Margaretmary Dys MR Brain with/without Contrast CLINICAL DATA: Metastatic disease, assess treatment response.  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 No new lesions.  Past or anticipated interventions, if any, per neurosurgery: {:18581}  Past or anticipated interventions, if any, per medical oncology: {:18581}  Dose of Decadron, if applicable: {:18581}  Recent neurologic symptoms, if any:  Seizures: {:18581} Headaches: {:18581} Nausea: {:18581} Dizziness/ataxia: {:18581} Difficulty with hand coordination: {:18581} Focal numbness/weakness: {:18581} Visual deficits/changes: {:18581} Confusion/Memory deficits: {:18581}  Painful bone metastases at present, if any: {:18581}  SAFETY ISSUES: Prior radiation? {:18581} Pacemaker/ICD? {:18581} Possible current pregnancy? Hysterectomy Is the patient on methotrexate? No  Additional Complaints / other details:

## 2023-07-23 ENCOUNTER — Ambulatory Visit
Admission: RE | Admit: 2023-07-23 | Discharge: 2023-07-23 | Disposition: A | Discharge status: Home / Self Care | Payer: 59 | Source: Ambulatory Visit | Attending: Radiation Oncology | Admitting: Radiation Oncology

## 2023-07-23 ENCOUNTER — Encounter: Payer: Self-pay | Admitting: Radiation Oncology

## 2023-07-23 VITALS — BP 135/69 | HR 65 | Temp 97.7°F | Resp 18 | Ht 65.0 in | Wt 166.2 lb

## 2023-07-23 DIAGNOSIS — C7931 Secondary malignant neoplasm of brain: Secondary | ICD-10-CM

## 2023-07-23 DIAGNOSIS — C3491 Malignant neoplasm of unspecified part of right bronchus or lung: Secondary | ICD-10-CM

## 2023-07-23 NOTE — Progress Notes (Signed)
[facility]         [phone] ________________________________  Initial Outpatient Consultation  Name: [name] MRN: [id number]  Date: 07/23/2023  DOB: July 07, 1961  REFERRING PHYSICIAN: Julio Sicks, MD  DIAGNOSIS: 62 y.o. 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   The primary encounter diagnosis was Squamous cell lung cancer, right (HCC). A diagnosis of Secondary cancer of brain [facility]) was also pertinent to this visit.    ICD-10-CM   1. Squamous cell lung cancer, right (HCC)  C34.91 NM PET Image Initial (PI) Skull Base To Thigh    2. Secondary cancer of brain (HCC)  C79.31 NM PET Image Initial (PI) Skull Base To Thigh      HISTORY OF PRESENT ILLNESS::[name] is a 62 y.o. female with a past medical history significant for sarcoidosis, breast cancer, and COPD who is being seen at the request of Dr. Jordan Likes. Patient has a long history of sarcoidosis with multiple pulmonary reticular nodules. In March of 2023 she had a CT of her chest which showed a 1.8 x 2.2 spiculated mass in the RUL. Pulmonology decided to follow-up with a chest X-ray in November 2023. CXR showed stable COPD/emphysema and right mid lung scaring, but no interval change.  Most recently, she presented to the ER on 06/26/23 with an overnight history of left arm numbness and paresthesias. MRI of the brain showed a 1 cm cystic mass involving the right parietal lobe and postcentral gyrus along with vasogenic edema without midline shift was seen. CT of the chest showed a 6.4 cm x 4.9 cm lobulated right apical mass with a T3 lesion suspicious for osteo metastatic disease. She was admitted for further workup. Neurosurgeon, Dr. [name], was consulted. He felt that the patient's brain lesion was most likely a metastatic lesion he did not feel that the patient's brain lesion was amenable to biopsy or resection. She was started on decadron.   Pulmonology was consulted and  recommended outpatient bronchoscopy. Patient underwent biopsy of the RUL lung mass on 07/09/23 under the care of Dr. [name]. Pathology revealed squamous cell carcinoma. Patient is scheduled to meet with Dr. Arbutus Ped on 07/26/2023 to discuss her treatment options.   Of note, patient underwent 3T MRI on 07/20/23. Imaging showed a 1.1 cm metastatic lesion in the right postcentral gyrus similar in size to the prior study with unchanged surrounding edema. No new lesions were seen.   We were kindly consulted to discuss radiation therapy options for the brain mass.    PREVIOUS RADIATION THERAPY: Yes   2016: Patient received 50.05 Gy in 20 fractions to her right breast under the care of Dr. Basilio Cairo   Past Medical History:  Diagnosis Date   Acute on chronic respiratory failure (HCC) 10/09/2016   Allergic rhinitis    Breast cancer (HCC) 2016   right breast   COPD, mild (HCC) FOLLOWED BY DR DUKG   Depression    GERD (gastroesophageal reflux disease)    HTN (hypertension)    Hypertension    Iron deficiency anemia    Long-term current use of steroids SYMBICORT INHALER   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    Overweight (BMI 25.0-29.9) 05/11/2016   Palpitations    Personal history of radiation therapy 2016   Sarcoidosis STABLE PER CXR JUNE 2013   Shortness of breath    Sickle cell trait (HCC)   :   Past Surgical History:  Procedure Laterality Date   ADJUSTABLE SUTURE MANIPULATION  05/22/2012   Procedure: ADJUSTABLE SUTURE MANIPULATION;  Surgeon: Corinda Gubler, MD;  Location: [facility];  Service: Ophthalmology;  Laterality: Right;   BREAST LUMPECTOMY Right 2016   BRONCHIAL BIOPSY  07/09/2023   Procedure: BRONCHIAL BIOPSIES;  Surgeon: Leslye Peer, MD;  Location[facility];  Service: Pulmonary;;   BRONCHIAL BRUSHINGS  07/09/2023   Procedure: BRONCHIAL BRUSHINGS;  Surgeon: Leslye Peer, MD;  Location[facility] ENDOSCOPY;  Service: Pulmonary;;   BRONCHIAL NEEDLE ASPIRATION BIOPSY  07/09/2023    Procedure: BRONCHIAL NEEDLE ASPIRATION BIOPSIES;  Surgeon: Leslye Peer, MD;  Location: [facility] ENDOSCOPY;  Service: Pulmonary;;   CESAREAN SECTION  1986   W/ BILATERAL TUBAL LIGATION   COLONOSCOPY WITH PROPOFOL N/A 02/21/2019   Procedure: COLONOSCOPY WITH PROPOFOL;  Surgeon: Hilarie Fredrickson, MD;  Location: [facility] ENDOSCOPY;  Service: Endoscopy;  Laterality: N/A;   EYE SURGERY     MEDIAN RECTUS REPAIR  05/22/2012   Procedure: MEDIAN RECTUS REPAIR;  Surgeon: Corinda Gubler, MD;  Location: [facility];  Service: Ophthalmology;  Laterality: Bilateral;  INFERIOR RECTUS RESECTION WITH ADJUSTIBLE SUTURES RIGHT EYE    POLYPECTOMY  02/21/2019   Procedure: POLYPECTOMY;  Surgeon: Hilarie Fredrickson, MD;  Location: [facility] ENDOSCOPY;  Service: Endoscopy;;   RADIOACTIVE SEED GUIDED PARTIAL MASTECTOMY WITH AXILLARY SENTINEL LYMPH NODE BIOPSY Right 08/18/2015   Procedure: RADIOACTIVE SEED GUIDED PARTIAL MASTECTOMY WITH AXILLARY SENTINEL LYMPH NODE BIOPSY;  Surgeon: Almond Lint, MD;  Location: [facility];  Service: General;  Laterality: Right;   TOTAL ROBOTIC ASSISTED LAPAROSCOPIC HYSTERECTOMY  12-30-2010   SYMPTOMATIC UTERINE FIBROIDS   UPPER TEETH EXTRACTION'S  1992   VIDEO BRONCHOSCOPY WITH RADIAL ENDOBRONCHIAL ULTRASOUND  07/09/2023   Procedure: VIDEO BRONCHOSCOPY WITH RADIAL ENDOBRONCHIAL ULTRASOUND;  Surgeon: Leslye Peer, MD;  Location: [facility] ENDOSCOPY;  Service: Pulmonary;;  :   Current Outpatient Medications:    acetaminophen (TYLENOL) 325 MG tablet, Take 650 mg by mouth every 6 (six) hours as needed for mild pain or moderate pain., Disp: , Rfl:    albuterol (PROVENTIL) (2.5 MG/3ML) 0.083% nebulizer solution, Take 3 mLs (2.5 mg total) by nebulization every 6 (six) hours as needed for wheezing or shortness of breath., Disp: 75 mL, Rfl: 1   albuterol (VENTOLIN HFA) 108 (90 Base) MCG/ACT inhaler, INHALE 2 PUFFS EVERY 6 HOURS AS NEEDED FOR WHEEZING OR SHORTNESS OF BREATH (Patient taking  differently: Inhale 2 puffs into the lungs every 6 (six) hours as needed for shortness of breath or wheezing.), Disp: 54 g, Rfl: 0   aspirin EC 81 MG tablet, Take 1 tablet (81 mg total) by mouth daily. Start 02/23/19, Disp: , Rfl:    atorvastatin (LIPITOR) 20 MG tablet, TAKE 1 TABLET (20 MG TOTAL) BY MOUTH DAILY., Disp: 90 tablet, Rfl: 3   dexamethasone (DECADRON) 4 MG tablet, One twice daily with food until better then one daily, Disp: 60 tablet, Rfl: 2   fluticasone (FLONASE) 50 MCG/ACT nasal spray, USE 2 SPRAYS IN BOTH  NOSTRILS DAILY AS NEEDED  FOR ALLERGIES (Patient taking differently: Place 2 sprays into both nostrils daily as needed for allergies.), Disp: 48 g, Rfl: 11   hydroxychloroquine (PLAQUENIL) 200 MG tablet, TAKE 1 TABLET BY MOUTH DAILY, Disp: 100 tablet, Rfl: 2   metoprolol tartrate (LOPRESSOR) 25 MG tablet, Take 1 tablet (25 mg total) by mouth daily., Disp: , Rfl:    montelukast (SINGULAIR) 10 MG tablet, Take 10 mg by mouth at bedtime., Disp: , Rfl:  Multiple Vitamin (MULTIVITAMIN) capsule, Take 1 capsule by mouth daily., Disp: , Rfl:    omeprazole (PRILOSEC) 20 MG capsule, TAKE 1 CAPSULE EVERY DAY (Patient taking differently: Take 20 mg by mouth daily.), Disp: 90 capsule, Rfl: 1   ondansetron (ZOFRAN) 4 MG tablet, Take 1 tablet (4 mg total) by mouth every 6 (six) hours as needed for nausea., Disp: 20 tablet, Rfl: 0   OXYGEN, 2lpm with rest and 3lpm with exertion, Disp: , Rfl:    sertraline (ZOLOFT) 50 MG tablet, TAKE 1 TABLET EVERY DAY (Patient taking differently: Take 50 mg by mouth daily.), Disp: 90 tablet, Rfl: 1   STIOLTO RESPIMAT 2.5-2.5 MCG/ACT AERS, USE 2 INHALATIONS BY MOUTH DAILY, Disp: 12 g, Rfl: 3   triamterene-hydrochlorothiazide (MAXZIDE-25) 37.5-25 MG tablet, TAKE 1 TABLET EVERY DAY (Patient taking differently: Take 1 tablet by mouth daily. TAKE 1 TABLET EVERY DAY), Disp: 90 tablet, Rfl: 1:   Allergies  Allergen Reactions   Codeine Other (See Comments)     Avoids-felt bad when took before Pt claims she can have this but does not like to take.  :   Family History  Problem Relation Age of Onset   Hyperlipidemia Mother    Asthma Mother    Lupus Mother    Breast cancer Mother 37   Hypertension Father    Hyperlipidemia Father    Hypertension Sister    Hypertension Brother    Cancer Neg Hx    Diabetes Neg Hx    Coronary artery disease Neg Hx   :   Social History   Socioeconomic History   Marital status: Single    Spouse name: Not on file   Number of children: Not on file   Years of education: Not on file   Highest education level: Not on file  Occupational History   Not on file  Tobacco Use   Smoking status: Former    Current packs/day: 0.00    Average packs/day: 1 pack/day for 20.0 years (20.0 ttl pk-yrs)    Types: Cigarettes    Start date: 11/14/1983    Quit date: 11/14/2003    Years since quitting: 19.7   Smokeless tobacco: Never  Vaping Use   Vaping status: Never Used  Substance and Sexual Activity   Alcohol use: No   Drug use: No   Sexual activity: Not on file  Other Topics Concern   Not on file  Social History Narrative   Lives with mother and his 3 children.  Works as a Psychologist, educational for sewing Administrator, arts)         Social Determinants of Corporate investment banker Strain: Not on file  Food Insecurity: No Food Insecurity (07/23/2023)   Hunger Vital Sign    Worried About Running Out of Food in the Last Year: Never true    Ran Out of Food in the Last Year: Never true  Transportation Needs: No Transportation Needs (07/23/2023)   PRAPARE - Administrator, Civil Service (Medical): No    Lack of Transportation (Non-Medical): No  Physical Activity: Not on file  Stress: Not on file  Social Connections: Not on file  Intimate Partner Violence: Not At Risk (07/23/2023)   Humiliation, Afraid, Rape, and Kick questionnaire    Fear of Current or Ex-Partner: No    Emotionally Abused: No    Physically Abused: No     Sexually Abused: No  :  REVIEW OF SYSTEMS:  Patient is experiencing left hand numbness. She denies any  headaches, visual or auditory disturbances, numbness, tingling, or weakness. She denies any cognitive or memory deficits. She denies any back pain.    PHYSICAL EXAM:  Blood pressure 135/69, pulse 65, temperature 97.7 F (36.5 C), temperature source Oral, resp. rate 18, height 5\' 5"  (1.651 m), weight 166 lb 3.2 oz (75.4 kg), SpO2 98%. In general this is a well appearing female in no acute distress. She's alert and oriented x4 and appropriate throughout the examination. Cardiopulmonary assessment is negative for acute distress and she exhibits normal effort.      KPS = 90  100 - Normal; no complaints; no evidence of disease. 90   - Able to carry on normal activity; minor signs or symptoms of disease. 80   - Normal activity with effort; some signs or symptoms of disease. 52   - Cares for self; unable to carry on normal activity or to do active work. 60   - Requires occasional assistance, but is able to care for most of his personal needs. 50   - Requires considerable assistance and frequent medical care. 40   - Disabled; requires special care and assistance. 30   - Severely disabled; hospital admission is indicated although death not imminent. 20   - Very sick; hospital admission necessary; active supportive treatment necessary. 10   - Moribund; fatal processes progressing rapidly. 0     - Dead  Karnofsky DA, Abelmann WH, Craver LS and [facility] [phone]) The use of the nitrogen mustards in the palliative treatment of carcinoma: with particular reference to bronchogenic carcinoma Cancer 1 634-56  LABORATORY DATA:  Lab Results  Component Value Date   WBC 13.0 (H) 06/27/2023   HGB 9.0 (L) 06/27/2023   HCT 29.6 (L) 06/27/2023   MCV 78.3 (L) 06/27/2023   PLT 386 06/27/2023   Lab Results  Component Value Date   NA 136 06/27/2023   K 4.2 06/27/2023   CL 96 (L) 06/27/2023   CO2 28  06/27/2023   Lab Results  Component Value Date   ALT 7 06/27/2023   AST 13 (L) 06/27/2023   ALKPHOS 65 06/27/2023   BILITOT 0.4 06/27/2023     RADIOGRAPHY: MR Brain W Wo Contrast  Result Date: 07/20/2023 CLINICAL DATA:  Metastatic disease, assess treatment response EXAM: MRI HEAD WITHOUT AND WITH CONTRAST TECHNIQUE: Multiplanar, multiecho pulse sequences of the brain and surrounding structures were obtained without and with intravenous contrast. CONTRAST:  7mL GADAVIST GADOBUTROL 1 MMOL/ML IV SOLN COMPARISON:  Brain MRI 06/26/2023 FINDINGS: Brain: There is ring-enhancing lesion in the right postcentral gyrus measuring 1.1 cm x 1.2 cm, not significantly changed compared to the prior study. There is surrounding vasogenic edema with mild swelling but no midline shift. There are no other lesions. There is no acute intracranial hemorrhage, extra-axial fluid collection, or acute infarct. Parenchymal volume is normal. The ventricles are normal in size. There is a minimal background chronic small-vessel ischemic change The pituitary and suprasellar region are normal. Vascular: Normal flow voids. Skull and upper cervical spine: Normal marrow signal. Sinuses/Orbits: The paranasal sinuses are clear. Bilateral lens implants are in place. The globes and orbits are otherwise unremarkable. Other: The mastoid air cells and middle ear cavities are clear. 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 No new lesions. Electronically Signed   By: Lesia Hausen M.D.   On: 07/20/2023 16:02   DG Chest [facility] 1 View  Result  Date: 07/09/2023 CLINICAL DATA:  Status post bronchoscopy with biopsy. EXAM: PORTABLE CHEST 1 VIEW COMPARISON:  September 13, 2022.  June 25, 2022. FINDINGS: Stable cardiomediastinal silhouette. Stable large right upper lobe mass is noted. No definite pneumothorax is noted status post bronchoscopy. Bony thorax is  unremarkable. IMPRESSION: No pneumothorax status post bronchoscopy. Large right upper lobe mass is noted as described on prior CT scan. Electronically Signed   By: Lupita Raider M.D.   On: 07/09/2023 10:43   DG C-ARM BRONCHOSCOPY  Result Date: 07/09/2023 C-ARM BRONCHOSCOPY: Fluoroscopy was utilized by the requesting physician.  No radiographic interpretation.   CT Chest W Contrast  Result Date: 06/26/2023 CLINICAL DATA:  New finding of brain mass for evaluation of the chest. Previously found to have right lung mass. * Tracking Code: BO * EXAM: CT CHEST WITH CONTRAST TECHNIQUE: Multidetector CT imaging of the chest was performed during intravenous contrast administration. RADIATION DOSE REDUCTION: This exam was performed according to the departmental dose-optimization program which includes automated exposure control, adjustment of the mA and/or kV according to patient size and/or use of iterative reconstruction technique. CONTRAST:  50mL OMNIPAQUE IOHEXOL 350 MG/ML SOLN COMPARISON:  CTA chest dated 01/21/2022 FINDINGS: Cardiovascular: Normal heart size. No significant pericardial fluid/thickening. Great vessels are normal in course and caliber. No central pulmonary emboli. Aortic atherosclerosis. Mediastinum/Nodes: Imaged thyroid gland without nodules meeting criteria for imaging follow-up by size. Normal esophagus. No pathologically enlarged axillary, supraclavicular, mediastinal, or hilar lymph nodes. Rightward deviation of the upper mediastinum secondary to right lung volume loss. Lungs/Pleura: The central airways are patent. Lobulated right apical, heterogeneously enhancing mass measures 6.4 x 4.9 cm (7:40), increased in size from 3.4 x 2.0 cm on 01/21/2022. Surrounding ground-glass and tree-in-bud nodules within the right upper lobe. Upper lobe predominant centrilobular and paraseptal emphysema. Unchanged irregular 1.3 x 0.7 cm nodule in the subpleural left lower lobe (7:112). Unchanged bilateral  lower lobe bronchiectasis and subsegmental atelectasis/scarring of the right middle lobe. No pneumothorax. No pleural effusion. Upper abdomen: Colonic diverticulosis without acute diverticulitis. Musculoskeletal: Increased diffuse sclerosis of T3. Surgical clips in the right breast. IMPRESSION: 1. Increased size of lobulated right apical, heterogeneously enhancing mass, suspicious for primary lung malignancy. Additional imaging evaluation or consultation with Pulmonology or Thoracic Surgery recommended. 2. Surrounding ground-glass and tree-in-bud nodules within the right upper lobe, likely infectious/inflammatory versus lymphangitic spread of disease. 3. Unchanged irregular 1.3 x 0.7 cm nodule in the subpleural left lower lobe. 4. Increased diffuse sclerosis of T3, suspicious for osseous metastatic disease. 5. Aortic Atherosclerosis (ICD10-I70.0) and Emphysema (ICD10-J43.9). Electronically Signed   By: Agustin Cree M.D.   On: 06/26/2023 20:24   MR BRAIN WO CONTRAST  Result Date: 06/26/2023 CLINICAL DATA:  Initial evaluation for neuro deficit, stroke suspected. Left-sided numbness and tingling. EXAM: MRI HEAD WITHOUT CONTRAST TECHNIQUE: Multiplanar, multiecho pulse sequences of the brain and surrounding structures were obtained without intravenous contrast. COMPARISON:  Prior study from 06/12/2014. FINDINGS: Brain: Mild age-related cerebral atrophy. Mild chronic microvascular ischemic disease for age. No evidence for acute or subacute infarct. No areas of chronic cortical infarction. 1 cm round cystic mass seen involving the right parietal lobe, postcentral gyrus (series 13, image 21). Small focus of associated susceptibility artifact could reflect blood products and/or necrosis. Surrounding T2/FLAIR signal intensity consistent with vasogenic edema. No significant regional mass effect or midline shift. No other visible mass lesion. No hydrocephalus. No extra-axial fluid collection. Pituitary gland suprasellar region  within normal limits. Vascular: Major intracranial vascular flow  voids are maintained. Skull and upper cervical spine: Craniocervical junction within normal limits. Bone marrow signal intensity normal. No scalp soft tissue abnormality. Sinuses/Orbits: Prior bilateral ocular lens replacement. Right gaze noted. Paranasal sinuses are clear. No significant mastoid effusion. Other: None. IMPRESSION: 1. 1 cm cystic mass involving the right parietal lobe, postcentral gyrus. Surrounding vasogenic edema without midline shift. Finding could reflect a solitary intracranial metastasis versus a primary CNS neoplasm. Follow-up examination with postcontrast MRI of the brain recommended for complete evaluation. 2. Underlying cerebral atrophy with mild chronic microvascular ischemic disease. Electronically Signed   By: Rise Mu M.D.   On: 06/26/2023 18:05      IMPRESSION: 62 y.o. 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   We personally reviewed the MRI results with the patient. She is a good candidate for stereotactic radiosurgery to the visualized 1.1 cm mass. We discussed the nature of brain metastases and the role radiation plays in treatment. We explained the logistics of treatment, along with the benefits, risks, and possible side effects. Side effects include, but are not limited to, hair loss, fatigue and edema.  Additionally, we spoke about radionecrosis that can result from treatment. We explained that stereotactic radiosurgery only treats the areas of gross disease while sparing the rest of the brain parenchyma. Patient expressed understanding of the treatment and would like to proceed.   PLAN: Patient is scheduled for CT simulation on 07/24/2023 and her treatment on 07/30/2023. PET scan ordered today to complete staging workup. She is scheduled to see Dr. Arbutus Ped on 07/26/2023.    I spent 60 minutes minutes face to face with the patient and more than 50% of that time  was spent in counseling and/or coordination of care.     Joyice Faster, PA-C    Margaretmary Dys, MD  [facility]  Radiation Oncology Direct Dial: [phone]  Fax: [phone] [email or web address]  Skype  LinkedIn

## 2023-07-24 ENCOUNTER — Ambulatory Visit
Admission: RE | Admit: 2023-07-24 | Discharge: 2023-07-24 | Disposition: A | Discharge status: Home / Self Care | Payer: 59 | Source: Ambulatory Visit | Attending: Radiation Oncology | Admitting: Radiation Oncology

## 2023-07-24 VITALS — BP 151/73 | HR 85 | Temp 98.1°F | Resp 18 | Wt 167.0 lb

## 2023-07-24 DIAGNOSIS — Z510 Encounter for antineoplastic radiation therapy: Secondary | ICD-10-CM | POA: Insufficient documentation

## 2023-07-24 DIAGNOSIS — C7931 Secondary malignant neoplasm of brain: Secondary | ICD-10-CM | POA: Diagnosis present

## 2023-07-24 DIAGNOSIS — C3411 Malignant neoplasm of upper lobe, right bronchus or lung: Secondary | ICD-10-CM | POA: Insufficient documentation

## 2023-07-24 DIAGNOSIS — C3490 Malignant neoplasm of unspecified part of unspecified bronchus or lung: Secondary | ICD-10-CM

## 2023-07-24 MED ORDER — SODIUM CHLORIDE 0.9% FLUSH
10.0000 mL | INTRAVENOUS | Status: DC | PRN
  Administered 2023-07-24: 10 mL via INTRAVENOUS

## 2023-07-24 NOTE — Progress Notes (Signed)
  Radiation Oncology         ([phone]) [phone] ________________________________  Name: [name] MRN: [id number]  Date: 07/24/2023  DOB: 05-05-1961  SIMULATION AND TREATMENT PLANNING NOTE    ICD-10-CM   1. 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)  C34.90    C79.31       DIAGNOSIS:  62 y.o. 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   NARRATIVE:  The patient was brought to the CT Simulation planning suite.  Identity was confirmed.  All relevant records and images related to the planned course of therapy were reviewed.  The patient freely provided informed written consent to proceed with treatment after reviewing the details related to the planned course of therapy. The consent form was witnessed and verified by the simulation staff. Intravenous access was established for contrast administration. Then, the patient was set-up in a stable reproducible supine position for radiation therapy.  A relocatable thermoplastic stereotactic head frame was fabricated for precise immobilization.  CT images were obtained.  Surface markings were placed.  The CT images were loaded into the planning software and fused with the patient's targeting MRI scan.  Then the target and avoidance structures were contoured.  Treatment planning then occurred.  The radiation prescription was entered and confirmed.  I have requested 3D planning  I have requested a DVH of the following structures: Brain stem, brain, left eye, right eye, lenses, optic chiasm, target volumes, uninvolved brain, and normal tissue.    SPECIAL TREATMENT PROCEDURE:  The planned course of therapy using radiation constitutes a special treatment procedure. Special care is required in the management of this patient for the following reasons. This treatment constitutes a Special Treatment Procedure for the following reason: High dose per fraction requiring special monitoring for increased toxicities of treatment  including daily imaging.  The special nature of the planned course of radiotherapy will require increased physician supervision and oversight to ensure patient's safety with optimal treatment outcomes.   PLAN:  The patient will receive 20 Gy in 1 fraction(s) SRS to the solitary brain metastasis in the right parietal lobe.  ________________________________  Artist Pais. [name], M.D.

## 2023-07-24 NOTE — Progress Notes (Signed)
Has armband been applied?  Yes.    Does patient have an allergy to IV contrast dye?: No.   Has patient ever received premedication for IV contrast dye?: No.   Does patient take metformin?: No.  If patient does take metformin when was the last dose: N/A  Date of lab work: June 26, 2023 BUN: 16 CR: 1.24 eGfr: 49  IV site: forearm left, condition patent and no redness  Has IV site been added to flowsheet?  Yes.    BP (!) 151/73   Pulse 85   Temp 98.1 F (36.7 C)   Resp 18   Wt 167 lb (75.8 kg)   SpO2 97%   BMI 27.79 kg/m   This concludes the interaction.  Ruel Favors, LPN

## 2023-07-25 ENCOUNTER — Other Ambulatory Visit: Payer: Self-pay | Admitting: Medical Oncology

## 2023-07-25 DIAGNOSIS — Z510 Encounter for antineoplastic radiation therapy: Secondary | ICD-10-CM | POA: Diagnosis not present

## 2023-07-25 DIAGNOSIS — C3490 Malignant neoplasm of unspecified part of unspecified bronchus or lung: Secondary | ICD-10-CM

## 2023-07-26 ENCOUNTER — Inpatient Hospital Stay: Payer: 59 | Attending: Internal Medicine | Admitting: Internal Medicine

## 2023-07-26 ENCOUNTER — Encounter: Payer: Self-pay | Admitting: Internal Medicine

## 2023-07-26 ENCOUNTER — Telehealth (HOSPITAL_COMMUNITY): Payer: Self-pay

## 2023-07-26 ENCOUNTER — Inpatient Hospital Stay: Payer: 59

## 2023-07-26 ENCOUNTER — Other Ambulatory Visit: Payer: Self-pay

## 2023-07-26 VITALS — BP 106/67 | HR 87 | Temp 98.5°F | Resp 16 | Ht 65.0 in | Wt 166.0 lb

## 2023-07-26 DIAGNOSIS — Z79899 Other long term (current) drug therapy: Secondary | ICD-10-CM | POA: Insufficient documentation

## 2023-07-26 DIAGNOSIS — C3411 Malignant neoplasm of upper lobe, right bronchus or lung: Secondary | ICD-10-CM | POA: Diagnosis present

## 2023-07-26 DIAGNOSIS — C3490 Malignant neoplasm of unspecified part of unspecified bronchus or lung: Secondary | ICD-10-CM

## 2023-07-26 DIAGNOSIS — Z853 Personal history of malignant neoplasm of breast: Secondary | ICD-10-CM | POA: Insufficient documentation

## 2023-07-26 DIAGNOSIS — Z7952 Long term (current) use of systemic steroids: Secondary | ICD-10-CM | POA: Diagnosis not present

## 2023-07-26 DIAGNOSIS — C3491 Malignant neoplasm of unspecified part of right bronchus or lung: Secondary | ICD-10-CM | POA: Diagnosis not present

## 2023-07-26 DIAGNOSIS — Z452 Encounter for adjustment and management of vascular access device: Secondary | ICD-10-CM | POA: Insufficient documentation

## 2023-07-26 DIAGNOSIS — C7951 Secondary malignant neoplasm of bone: Secondary | ICD-10-CM | POA: Insufficient documentation

## 2023-07-26 DIAGNOSIS — C7931 Secondary malignant neoplasm of brain: Secondary | ICD-10-CM | POA: Diagnosis present

## 2023-07-26 DIAGNOSIS — Z87891 Personal history of nicotine dependence: Secondary | ICD-10-CM | POA: Insufficient documentation

## 2023-07-26 LAB — CBC WITH DIFFERENTIAL (CANCER CENTER ONLY)
Abs Immature Granulocytes: 0.46 10*3/uL — ABNORMAL HIGH (ref 0.00–0.07)
Basophils Absolute: 0 10*3/uL (ref 0.0–0.1)
Basophils Relative: 0 %
Eosinophils Absolute: 0.3 10*3/uL (ref 0.0–0.5)
Eosinophils Relative: 2 %
HCT: 31.3 % — ABNORMAL LOW (ref 36.0–46.0)
Hemoglobin: 10.1 g/dL — ABNORMAL LOW (ref 12.0–15.0)
Immature Granulocytes: 3 %
Lymphocytes Relative: 9 %
Lymphs Abs: 1.4 10*3/uL (ref 0.7–4.0)
MCH: 25.1 pg — ABNORMAL LOW (ref 26.0–34.0)
MCHC: 32.3 g/dL (ref 30.0–36.0)
MCV: 77.7 fL — ABNORMAL LOW (ref 80.0–100.0)
Monocytes Absolute: 1.2 10*3/uL — ABNORMAL HIGH (ref 0.1–1.0)
Monocytes Relative: 8 %
Neutro Abs: 11.4 10*3/uL — ABNORMAL HIGH (ref 1.7–7.7)
Neutrophils Relative %: 78 %
Platelet Count: 416 10*3/uL — ABNORMAL HIGH (ref 150–400)
RBC: 4.03 MIL/uL (ref 3.87–5.11)
RDW: 17.2 % — ABNORMAL HIGH (ref 11.5–15.5)
WBC Count: 14.8 10*3/uL — ABNORMAL HIGH (ref 4.0–10.5)
nRBC: 0 % (ref 0.0–0.2)

## 2023-07-26 LAB — CMP (CANCER CENTER ONLY)
ALT: 10 U/L (ref 0–44)
AST: 11 U/L — ABNORMAL LOW (ref 15–41)
Albumin: 3.6 g/dL (ref 3.5–5.0)
Alkaline Phosphatase: 75 U/L (ref 38–126)
Anion gap: 6 (ref 5–15)
BUN: 22 mg/dL (ref 8–23)
CO2: 37 mmol/L — ABNORMAL HIGH (ref 22–32)
Calcium: 9.1 mg/dL (ref 8.9–10.3)
Chloride: 99 mmol/L (ref 98–111)
Creatinine: 1.02 mg/dL — ABNORMAL HIGH (ref 0.44–1.00)
GFR, Estimated: >60 mL/min (ref >60)
Glucose, Bld: 82 mg/dL (ref 70–99)
Potassium: 3.6 mmol/L (ref 3.5–5.1)
Sodium: 142 mmol/L (ref 135–145)
Total Bilirubin: 0.4 mg/dL (ref 0.3–1.2)
Total Protein: 6.6 g/dL (ref 6.5–8.1)

## 2023-07-26 MED ORDER — LIDOCAINE-PRILOCAINE 2.5-2.5 % EX CREA
TOPICAL_CREAM | CUTANEOUS | 3 refills | Status: DC
Start: 2023-08-01 — End: 2023-12-06

## 2023-07-26 MED ORDER — PROCHLORPERAZINE MALEATE 10 MG PO TABS: 10.0000 mg | ORAL_TABLET | Freq: Four times a day (QID) | ORAL | 1 refills | Status: DC | PRN

## 2023-07-26 NOTE — Progress Notes (Signed)
 START ON PATHWAY REGIMEN - Non-Small Cell Lung     A cycle is every 21 days:     Pembrolizumab       Paclitaxel       Carboplatin    **Always confirm dose/schedule in your pharmacy ordering system**  Patient Characteristics: Stage IV Metastatic, Squamous, Molecular Analysis Not Elected, PS = 0, 1, Initial Chemotherapy/Immunotherapy, Immunotherapy Candidate, PD-L1 Expression Positive 1-49% (TPS) / Negative / Not Tested / Awaiting Test Results Therapeutic Status: Stage IV Metastatic Histology: Squamous Cell ECOG Performance Status: 1 Chemotherapy/Immunotherapy Line of Therapy: Initial Chemotherapy/Immunotherapy Immunotherapy Candidate Status: Candidate for Immunotherapy PD-L1 Expression Status: Awaiting Test Results Intent of Therapy: Non-Curative / Palliative Intent, Discussed with Patient

## 2023-07-26 NOTE — Progress Notes (Signed)
[facility] [phone]   [phone]  CONSULT NOTE  REFERRING PHYSICIAN: Dr. Levy Pupa  REASON FOR CONSULTATION:  62 years old African-American female recently diagnosed with lung cancer.  HPI [name] is a 62 y.o. female with past medical history significant for COPD, hypertension, history of right breast cancer in 2016 status postlumpectomy followed by adjuvant radiotherapy and hormonal therapy with anastrozole, iron deficiency anemia, sarcoidosis, depression as well as sickle cell trait and long history of smoking.  The patient was found in March 2023 on CT scan of the chest for evaluation of shortness of breath at that time to have a 2.0 cm right upper lobe pulmonary nodule suspicious for malignancy.  She was followed by Dr. Pamelia Hoit for her breast cancer and Dr. [name] her pulmonologist.  For some reason the patient was lost to follow-up with no further evaluation of this pulmonary nodule.  The patient presented to the emergency department on June 26, 2023 complaining of neurological deficit with left-sided numbness and tingling's in the upper extremity.  This was suspicious for stroke.  She had MRI of the brain without contrast on 06/26/2023 and that showed 1.0 cm cystic mass involving the right parietal lobe, postcentral gyrus with surrounding vasogenic edema without midline shift.  The findings could reflect a solitary intracranial metastasis versus primary CNS neoplasm.  The patient had CT scan of the chest on 06/26/2023 for restaging workup and that showed lobulated right apical, heterogeneously enhancing mass measuring 6.4 x 4.9 cm increased in size from 3.4 x 2.0 cm on January 21, 2022.  There was unchanged irregular 1.3 x 0.7 cm nodule in the subpleural left lower lobe.  Scan also showed increased diffuse sclerosis of T3 suspicious for osseous metastatic disease.  On July 09, 2023 the patient underwent video bronchoscopy with robotic assistance  under the care of Dr. [name].  The final pathology ([id number]) of the right upper lobe lung mass fine-needle aspiration as well as brushing showed squamous cell carcinoma.  The patient was seen by Dr. [name] and she had repeat MRI of the brain with and without contrast on 07/20/2023 and that showed 1.0 cm metastatic lesion in the right postcentral gyrus similar in size to the previous study with unchanged surrounding edema and no new lesions.  She is expected to undergo SRS to the brain metastasis next week. She was referred to me today for evaluation and recommendation regarding treatment of her condition. When seen today the patient was a little bit tired and fatigued and sleepy at times.  She has shortness of breath and cough no significant chest pain or hemoptysis.  She denied having any nausea, vomiting, diarrhea but has constipation.  She lost around 30 pounds in the last 3 months.  She has no headache or visual changes. Family history significant for mother with breast cancer, dyslipidemia.  Father had hypertension and dyslipidemia.  Brother had stomach cancer. The patient is single and has 4 children.  She was accompanied today by her granddaughter Lajoyce Corners.  She has a history of smoking 1 pack/day.  She has a history of smoking 1 pack/day for around 30 years and quit in 2005.  She has no history of alcohol or drug abuse. HPI  Past Medical History:  Diagnosis Date   Acute on chronic respiratory failure (HCC) 10/09/2016   Allergic rhinitis    Breast cancer (HCC) 2016   right breast   COPD, mild (HCC) FOLLOWED BY DR [name]   Depression  GERD (gastroesophageal reflux disease)    HTN (hypertension)    Hypertension    Iron deficiency anemia    Long-term current use of steroids SYMBICORT INHALER   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    Overweight (BMI 25.0-29.9) 05/11/2016   Palpitations    Personal history of radiation therapy 2016   Sarcoidosis STABLE PER CXR JUNE 2013   Shortness of breath    Sickle  cell trait (HCC)     Past Surgical History:  Procedure Laterality Date   ADJUSTABLE SUTURE MANIPULATION  05/22/2012   Procedure: ADJUSTABLE SUTURE MANIPULATION;  Surgeon: Corinda Gubler, MD;  Location: [facility];  Service: Ophthalmology;  Laterality: Right;   BREAST LUMPECTOMY Right 2016   BRONCHIAL BIOPSY  07/09/2023   Procedure: BRONCHIAL BIOPSIES;  Surgeon: Leslye Peer, MD;  Location: [facility] ENDOSCOPY;  Service: Pulmonary;;   BRONCHIAL BRUSHINGS  07/09/2023   Procedure: BRONCHIAL BRUSHINGS;  Surgeon: Leslye Peer, MD;  Location: [facility] ENDOSCOPY;  Service: Pulmonary;;   BRONCHIAL NEEDLE ASPIRATION BIOPSY  07/09/2023   Procedure: BRONCHIAL NEEDLE ASPIRATION BIOPSIES;  Surgeon: Leslye Peer, MD;  Location: [facility] ENDOSCOPY;  Service: Pulmonary;;   CESAREAN SECTION  1986   W/ BILATERAL TUBAL LIGATION   COLONOSCOPY WITH PROPOFOL N/A 02/21/2019   Procedure: COLONOSCOPY WITH PROPOFOL;  Surgeon: Hilarie Fredrickson, MD;  Location: [facility] ENDOSCOPY;  Service: Endoscopy;  Laterality: N/A;   EYE SURGERY     MEDIAN RECTUS REPAIR  05/22/2012   Procedure: MEDIAN RECTUS REPAIR;  Surgeon: Corinda Gubler, MD;  Location: [facility];  Service: Ophthalmology;  Laterality: Bilateral;  INFERIOR RECTUS RESECTION WITH ADJUSTIBLE SUTURES RIGHT EYE    POLYPECTOMY  02/21/2019   Procedure: POLYPECTOMY;  Surgeon: Hilarie Fredrickson, MD;  Location: [facility] ENDOSCOPY;  Service: Endoscopy;;   RADIOACTIVE SEED GUIDED PARTIAL MASTECTOMY WITH AXILLARY SENTINEL LYMPH NODE BIOPSY Right 08/18/2015   Procedure: RADIOACTIVE SEED GUIDED PARTIAL MASTECTOMY WITH AXILLARY SENTINEL LYMPH NODE BIOPSY;  Surgeon: Almond Lint, MD;  Location: [facility];  Service: General;  Laterality: Right;   TOTAL ROBOTIC ASSISTED LAPAROSCOPIC HYSTERECTOMY  12-30-2010   SYMPTOMATIC UTERINE FIBROIDS   UPPER TEETH EXTRACTION'S  1992   VIDEO BRONCHOSCOPY WITH RADIAL ENDOBRONCHIAL ULTRASOUND  07/09/2023   Procedure: VIDEO  BRONCHOSCOPY WITH RADIAL ENDOBRONCHIAL ULTRASOUND;  Surgeon: Leslye Peer, MD;  Location: [facility] ENDOSCOPY;  Service: Pulmonary;;    Family History  Problem Relation Age of Onset   Hyperlipidemia Mother    Asthma Mother    Lupus Mother    Breast cancer Mother 77   Hypertension Father    Hyperlipidemia Father    Hypertension Sister    Hypertension Brother    Cancer Neg Hx    Diabetes Neg Hx    Coronary artery disease Neg Hx     Social History Social History   Tobacco Use   Smoking status: Former    Current packs/day: 0.00    Average packs/day: 1 pack/day for 20.0 years (20.0 ttl pk-yrs)    Types: Cigarettes    Start date: 11/14/1983    Quit date: 11/14/2003    Years since quitting: 19.7   Smokeless tobacco: Never  Vaping Use   Vaping status: Never Used  Substance Use Topics   Alcohol use: No   Drug use: No    Allergies  Allergen Reactions   Codeine Other (See Comments)    Avoids-felt bad when took before Pt claims she can have this but does not like to take.  Current Outpatient Medications  Medication Sig Dispense Refill   acetaminophen (TYLENOL) 325 MG tablet Take 650 mg by mouth every 6 (six) hours as needed for mild pain or moderate pain.     albuterol (PROVENTIL) (2.5 MG/3ML) 0.083% nebulizer solution Take 3 mLs (2.5 mg total) by nebulization every 6 (six) hours as needed for wheezing or shortness of breath. 75 mL 1   albuterol (VENTOLIN HFA) 108 (90 Base) MCG/ACT inhaler INHALE 2 PUFFS EVERY 6 HOURS AS NEEDED FOR WHEEZING OR SHORTNESS OF BREATH (Patient taking differently: Inhale 2 puffs into the lungs every 6 (six) hours as needed for shortness of breath or wheezing.) 54 g 0   aspirin EC 81 MG tablet Take 1 tablet (81 mg total) by mouth daily. Start 02/23/19     atorvastatin (LIPITOR) 20 MG tablet TAKE 1 TABLET (20 MG TOTAL) BY MOUTH DAILY. 90 tablet 3   dexamethasone (DECADRON) 4 MG tablet One twice daily with food until better then one daily 60 tablet 2    fluticasone (FLONASE) 50 MCG/ACT nasal spray USE 2 SPRAYS IN BOTH  NOSTRILS DAILY AS NEEDED  FOR ALLERGIES (Patient taking differently: Place 2 sprays into both nostrils daily as needed for allergies.) 48 g 11   hydroxychloroquine (PLAQUENIL) 200 MG tablet TAKE 1 TABLET BY MOUTH DAILY 100 tablet 2   metoprolol tartrate (LOPRESSOR) 25 MG tablet Take 1 tablet (25 mg total) by mouth daily.     montelukast (SINGULAIR) 10 MG tablet Take 10 mg by mouth at bedtime.     Multiple Vitamin (MULTIVITAMIN) capsule Take 1 capsule by mouth daily.     omeprazole (PRILOSEC) 20 MG capsule TAKE 1 CAPSULE EVERY DAY (Patient taking differently: Take 20 mg by mouth daily.) 90 capsule 1   ondansetron (ZOFRAN) 4 MG tablet Take 1 tablet (4 mg total) by mouth every 6 (six) hours as needed for nausea. 20 tablet 0   OXYGEN 2lpm with rest and 3lpm with exertion     sertraline (ZOLOFT) 50 MG tablet TAKE 1 TABLET EVERY DAY (Patient taking differently: Take 50 mg by mouth daily.) 90 tablet 1   STIOLTO RESPIMAT 2.5-2.5 MCG/ACT AERS USE 2 INHALATIONS BY MOUTH DAILY 12 g 3   triamterene-hydrochlorothiazide (MAXZIDE-25) 37.5-25 MG tablet TAKE 1 TABLET EVERY DAY (Patient taking differently: Take 1 tablet by mouth daily. TAKE 1 TABLET EVERY DAY) 90 tablet 1   No current facility-administered medications for this visit.    Review of Systems  Constitutional: positive for fatigue and weight loss Eyes: negative Ears, nose, mouth, throat, and face: negative Respiratory: positive for cough and dyspnea on exertion Cardiovascular: negative Gastrointestinal: negative Genitourinary:negative Integument/breast: negative Hematologic/lymphatic: negative Musculoskeletal:negative Neurological: negative Behavioral/Psych: negative Endocrine: negative Allergic/Immunologic: negative  Physical Exam  UJW:JXBJY, healthy, no distress, well nourished, and well developed SKIN: skin color, texture, turgor are normal, no rashes or significant  lesions HEAD: Normocephalic, No masses, lesions, tenderness or abnormalities EYES: normal, PERRLA, Conjunctiva are pink and non-injected EARS: External ears normal, Canals clear OROPHARYNX:no exudate, no erythema, and lips, buccal mucosa, and tongue normal  NECK: supple, no adenopathy, no JVD LYMPH:  no palpable lymphadenopathy, no hepatosplenomegaly BREAST:not examined LUNGS: clear to auscultation , and palpation HEART: regular rate & rhythm, no murmurs, and no gallops ABDOMEN:abdomen soft, non-tender, normal bowel sounds, and no masses or organomegaly BACK: Back symmetric, no curvature., No CVA tenderness EXTREMITIES:no joint deformities, effusion, or inflammation, no edema  NEURO: alert & oriented x 3 with fluent speech, no focal motor/sensory deficits  PERFORMANCE STATUS: ECOG 1  LABORATORY DATA: Lab Results  Component Value Date   WBC 14.8 (H) 07/26/2023   HGB 10.1 (L) 07/26/2023   HCT 31.3 (L) 07/26/2023   MCV 77.7 (L) 07/26/2023   PLT 416 (H) 07/26/2023      Chemistry      Component Value Date/Time   NA 142 07/26/2023 1347   NA 146 (H) 01/10/2019 1220   K 3.6 07/26/2023 1347   CL 99 07/26/2023 1347   CO2 37 (H) 07/26/2023 1347   BUN 22 07/26/2023 1347   BUN 21 01/10/2019 1220   CREATININE 1.02 (H) 07/26/2023 1347   CREATININE 0.64 02/04/2015 1619      Component Value Date/Time   CALCIUM 9.1 07/26/2023 1347   ALKPHOS 75 07/26/2023 1347   AST 11 (L) 07/26/2023 1347   ALT 10 07/26/2023 1347   BILITOT 0.4 07/26/2023 1347       RADIOGRAPHIC STUDIES: MR Brain W Wo Contrast  Result Date: 07/20/2023 CLINICAL DATA:  Metastatic disease, assess treatment response EXAM: MRI HEAD WITHOUT AND WITH CONTRAST TECHNIQUE: Multiplanar, multiecho pulse sequences of the brain and surrounding structures were obtained without and with intravenous contrast. CONTRAST:  7mL GADAVIST GADOBUTROL 1 MMOL/ML IV SOLN COMPARISON:  Brain MRI 06/26/2023 FINDINGS: Brain: There is ring-enhancing  lesion in the right postcentral gyrus measuring 1.1 cm x 1.2 cm, not significantly changed compared to the prior study. There is surrounding vasogenic edema with mild swelling but no midline shift. There are no other lesions. There is no acute intracranial hemorrhage, extra-axial fluid collection, or acute infarct. Parenchymal volume is normal. The ventricles are normal in size. There is a minimal background chronic small-vessel ischemic change The pituitary and suprasellar region are normal. Vascular: Normal flow voids. Skull and upper cervical spine: Normal marrow signal. Sinuses/Orbits: The paranasal sinuses are clear. Bilateral lens implants are in place. The globes and orbits are otherwise unremarkable. Other: The mastoid air cells and middle ear cavities are clear. 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 No new lesions. Electronically Signed   By: Lesia Hausen M.D.   On: 07/20/2023 16:02   DG Chest Port 1 View  Result Date: 07/09/2023 CLINICAL DATA:  Status post bronchoscopy with biopsy. EXAM: PORTABLE CHEST 1 VIEW COMPARISON:  September 13, 2022.  June 25, 2022. FINDINGS: Stable cardiomediastinal silhouette. Stable large right upper lobe mass is noted. No definite pneumothorax is noted status post bronchoscopy. Bony thorax is unremarkable. IMPRESSION: No pneumothorax status post bronchoscopy. Large right upper lobe mass is noted as described on prior CT scan. Electronically Signed   By: Lupita Raider M.D.   On: 07/09/2023 10:43   DG C-ARM BRONCHOSCOPY  Result Date: 07/09/2023 C-ARM BRONCHOSCOPY: Fluoroscopy was utilized by the requesting physician.  No radiographic interpretation.   CT Chest W Contrast  Result Date: 06/26/2023 CLINICAL DATA:  New finding of brain mass for evaluation of the chest. Previously found to have right lung mass. * Tracking Code: BO * EXAM: CT CHEST WITH CONTRAST  TECHNIQUE: Multidetector CT imaging of the chest was performed during intravenous contrast administration. RADIATION DOSE REDUCTION: This exam was performed according to the departmental dose-optimization program which includes automated exposure control, adjustment of the mA and/or kV according to patient size and/or use of iterative reconstruction technique. CONTRAST:  50mL OMNIPAQUE IOHEXOL 350 MG/ML SOLN COMPARISON:  CTA chest dated 01/21/2022 FINDINGS: Cardiovascular: Normal heart size. No  significant pericardial fluid/thickening. Great vessels are normal in course and caliber. No central pulmonary emboli. Aortic atherosclerosis. Mediastinum/Nodes: Imaged thyroid gland without nodules meeting criteria for imaging follow-up by size. Normal esophagus. No pathologically enlarged axillary, supraclavicular, mediastinal, or hilar lymph nodes. Rightward deviation of the upper mediastinum secondary to right lung volume loss. Lungs/Pleura: The central airways are patent. Lobulated right apical, heterogeneously enhancing mass measures 6.4 x 4.9 cm (7:40), increased in size from 3.4 x 2.0 cm on 01/21/2022. Surrounding ground-glass and tree-in-bud nodules within the right upper lobe. Upper lobe predominant centrilobular and paraseptal emphysema. Unchanged irregular 1.3 x 0.7 cm nodule in the subpleural left lower lobe (7:112). Unchanged bilateral lower lobe bronchiectasis and subsegmental atelectasis/scarring of the right middle lobe. No pneumothorax. No pleural effusion. Upper abdomen: Colonic diverticulosis without acute diverticulitis. Musculoskeletal: Increased diffuse sclerosis of T3. Surgical clips in the right breast. IMPRESSION: 1. Increased size of lobulated right apical, heterogeneously enhancing mass, suspicious for primary lung malignancy. Additional imaging evaluation or consultation with Pulmonology or Thoracic Surgery recommended. 2. Surrounding ground-glass and tree-in-bud nodules within the right upper  lobe, likely infectious/inflammatory versus lymphangitic spread of disease. 3. Unchanged irregular 1.3 x 0.7 cm nodule in the subpleural left lower lobe. 4. Increased diffuse sclerosis of T3, suspicious for osseous metastatic disease. 5. Aortic Atherosclerosis (ICD10-I70.0) and Emphysema (ICD10-J43.9). Electronically Signed   By: Agustin Cree M.D.   On: 06/26/2023 20:24   MR BRAIN WO CONTRAST  Result Date: 06/26/2023 CLINICAL DATA:  Initial evaluation for neuro deficit, stroke suspected. Left-sided numbness and tingling. EXAM: MRI HEAD WITHOUT CONTRAST TECHNIQUE: Multiplanar, multiecho pulse sequences of the brain and surrounding structures were obtained without intravenous contrast. COMPARISON:  Prior study from 06/12/2014. FINDINGS: Brain: Mild age-related cerebral atrophy. Mild chronic microvascular ischemic disease for age. No evidence for acute or subacute infarct. No areas of chronic cortical infarction. 1 cm round cystic mass seen involving the right parietal lobe, postcentral gyrus (series 13, image 21). Small focus of associated susceptibility artifact could reflect blood products and/or necrosis. Surrounding T2/FLAIR signal intensity consistent with vasogenic edema. No significant regional mass effect or midline shift. No other visible mass lesion. No hydrocephalus. No extra-axial fluid collection. Pituitary gland suprasellar region within normal limits. Vascular: Major intracranial vascular flow voids are maintained. Skull and upper cervical spine: Craniocervical junction within normal limits. Bone marrow signal intensity normal. No scalp soft tissue abnormality. Sinuses/Orbits: Prior bilateral ocular lens replacement. Right gaze noted. Paranasal sinuses are clear. No significant mastoid effusion. Other: None. IMPRESSION: 1. 1 cm cystic mass involving the right parietal lobe, postcentral gyrus. Surrounding vasogenic edema without midline shift. Finding could reflect a solitary intracranial metastasis  versus a primary CNS neoplasm. Follow-up examination with postcontrast MRI of the brain recommended for complete evaluation. 2. Underlying cerebral atrophy with mild chronic microvascular ischemic disease. Electronically Signed   By: Rise Mu M.D.   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  PLAN: I had a lengthy discussion with the patient and her granddaughter today about her current condition, prognosis and treatment options. I personally and independently reviewed the scan images and discussed the results with the patient and her granddaughter. We will complete the staging workup with a PET scan that is scheduled to be performed next week.  I will send her tissue biopsy for PD-L1 expression. I explained to the patient that she has incurable condition and all the treatment will be of palliative nature. I gave the patient the option of palliative care and hospice referral versus consideration of palliative systemic chemoimmunotherapy with carboplatin for AUC of 5, paclitaxel 175 Mg/M2 and Keytruda 200 Mg IV every 3 weeks with Neulasta support.  The patient is interested in treatment. I discussed with her the adverse effect of this treatment including but not limited to alopecia, myelosuppression, nausea and vomiting, peripheral neuropathy, liver or renal dysfunction as well as immunotherapy adverse effects. She is expected to start the first dose of her treatment next week.  The patient will have a chemotherapy education class before the first dose of her treatment. I will refer her to IR for Port-A-Cath placement. She will have a chemotherapy education class before the first dose of treatment. She will come back for follow-up visit in 2  weeks for evaluation and management of any adverse effect of her treatment. I will call her pharmacy with prescription for Compazine 10 mg p.o. every 6 hours as needed for nausea in addition to Emla cream to be applied to the Port-A-Cath site before treatment.  The patient voices understanding of current disease status and treatment options and is in agreement with the current care plan.  All questions were answered. The patient knows to call the clinic with any problems, questions or concerns. We can certainly see the patient much sooner if necessary.  Thank you so much for allowing me to participate in the care of [name]. I will continue to follow up the patient with you and assist in her care.  The total time spent in the appointment was 90 minutes.  Disclaimer: This note was dictated with voice recognition software. Similar sounding words can inadvertently be transcribed and may not be corrected upon review.   Lajuana Matte July 26, 2023, 2:30 PM

## 2023-07-26 NOTE — Telephone Encounter (Signed)
Called to give port instructions, no answer, left vm. AB

## 2023-07-26 NOTE — Progress Notes (Signed)
Request for pt's tissue be sent for PD-L1 testing emailed to Rudean Curt, [facility] pathology technician.

## 2023-07-27 ENCOUNTER — Telehealth: Payer: Self-pay | Admitting: Internal Medicine

## 2023-07-27 ENCOUNTER — Encounter: Payer: Self-pay | Admitting: Internal Medicine

## 2023-07-27 ENCOUNTER — Other Ambulatory Visit: Payer: Self-pay

## 2023-07-27 NOTE — Telephone Encounter (Signed)
Scheduled per los and work-queue, patient has been called and notified.

## 2023-07-27 NOTE — Progress Notes (Signed)
I met with the pt in person at her consult with Dr.mohamed. She was accompanied by her granddtr, octavia. The plan for the pt is 4 cycles of chemotherapy every 3 weeks with carbo/taxol/keytruda. Pt will require a port a cath. I reviewed the functions of a portacath and explained how to apply the emla cream. Pt is somewhat familiar with ports as her mother had one while going through her own cancer treatment. I explained that the chemo will need to go through a chemo education class with a nurse educator. At this time, pt only needs help with coordinating her appts. She has means of transportation from family and friends. I provided my business card to the pt and let her know to call me with any questions.  I was able to schedule the pt's PET scan for 9/20 and her port placement for 9/18.

## 2023-07-30 ENCOUNTER — Ambulatory Visit
Admission: RE | Admit: 2023-07-30 | Discharge: 2023-07-30 | Disposition: A | Discharge status: Home / Self Care | Payer: 59 | Source: Ambulatory Visit | Attending: Radiation Oncology | Admitting: Radiation Oncology

## 2023-07-30 ENCOUNTER — Other Ambulatory Visit: Payer: Self-pay

## 2023-07-30 VITALS — BP 131/79 | HR 58 | Temp 97.8°F | Resp 20

## 2023-07-30 DIAGNOSIS — Z510 Encounter for antineoplastic radiation therapy: Secondary | ICD-10-CM | POA: Diagnosis not present

## 2023-07-30 DIAGNOSIS — C3490 Malignant neoplasm of unspecified part of unspecified bronchus or lung: Secondary | ICD-10-CM

## 2023-07-30 LAB — RAD ONC ARIA SESSION SUMMARY
Course Elapsed Days: 0
Plan Fractions Treated to Date: 1
Plan Prescribed Dose Per Fraction: 20 Gy
Plan Total Fractions Prescribed: 1
Plan Total Prescribed Dose: 20 Gy
Reference Point Dosage Given to Date: 20 Gy
Reference Point Session Dosage Given: 20 Gy
Session Number: 1

## 2023-07-30 NOTE — Progress Notes (Signed)
Patient to nursing for 30 minute observation SRS Brain.  Patient alert and verbally responsive.  Denies headache, fatigue, visual changes, ringing in the ears, nausea, and no skin irritation.  Reports is taking Decadron 4 mg once a daily down from 4 mg BID.  Advised to avoid strenuous activity for 24 hours.  Call if any concerns [phone].  Vitals:  97.8-58-20-131/79 O2 sat 100% on oxygen 2 liters.

## 2023-07-30 NOTE — Op Note (Signed)
Name: [name]  MRN: [id number]  Date: 07/30/2023   DOB: [birth year]  Stereotactic Radiosurgery Operative Note  PRE-OPERATIVE DIAGNOSIS:  Solitary Brain Metastasis  POST-OPERATIVE DIAGNOSIS:  Solitary Brain Metastasis  PROCEDURE:  Stereotactic Radiosurgery  SURGEON:  Temple Pacini, MD  NARRATIVE: The patient underwent a radiation treatment planning session in the radiation oncology simulation suite under the care of the radiation oncology physician and physicist.  I participated closely in the radiation treatment planning afterwards. The patient underwent planning CT which was fused to 3T high resolution MRI with 1 mm axial slices.  These images were fused on the planning system.  We contoured the gross target volumes and subsequently expanded this to yield the Planning Target Volume. I actively participated in the planning process.  I helped to define and review the target contours and also the contours of the optic pathway, eyes, brainstem and selected nearby organs at risk.  All the dose constraints for critical structures were reviewed and compared to AAPM Task Group 101.  The prescription dose conformity was reviewed.  I approved the plan electronically.    Accordingly, [name] was brought to the TrueBeam stereotactic radiation treatment linac and placed in the custom immobilization mask.  The patient was aligned according to the IR fiducial markers with BrainLab Exactrac, then orthogonal x-rays were used in ExacTrac with the 6DOF robotic table and the shifts were made to align the patient  [name] received stereotactic radiosurgery uneventfully.    The detailed description of the procedure is recorded in the radiation oncology procedure note.  I was present for the duration of the procedure.  DISPOSITION:  Following delivery, the patient was transported to nursing in stable condition and monitored for possible acute effects to be discharged to home in stable condition with  follow-up in one month.  Temple Pacini, MD 07/30/2023 3:22 PM

## 2023-07-31 ENCOUNTER — Other Ambulatory Visit: Payer: Self-pay | Admitting: Radiology

## 2023-07-31 NOTE — Progress Notes (Signed)
Radiation Oncology         [phone]) [phone] ________________________________  Stereotactic Treatment Procedure Note  Name: [name] MRN: [id number]  Date: 07/30/2023  DOB: [birth year]  SPECIAL TREATMENT PROCEDURE    ICD-10-CM   1. 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  It showed 3-dimensional radiation distributions overlaid onto the planning CT/MRI image set.  The [facility] for the target structures as well as the organs at risk were reviewed. The documentation of the 3D plan and dosimetry are filed in the radiation oncology EMR.  NARRATIVE:  [name] was brought to the TrueBeam stereotactic radiation treatment machine and placed supine on the CT couch. The head frame was applied, and the patient was set up for stereotactic radiosurgery.  Neurosurgery was present for the set-up and delivery  SIMULATION VERIFICATION:  In the couch zero-angle position, the patient underwent Exactrac imaging using the Brainlab system with orthogonal KV images.  These were carefully aligned and repeated to confirm treatment position for each of the isocenters.  The Exactrac snap film verification was repeated at each couch angle.  PROCEDURE: [name] received stereotactic radiosurgery to the following targets: Right Parietal 12 mm target was treated using 4 Rapid Arc VMAT Beams to a prescription dose of 20 Gy.  ExacTrac registration was performed for each couch angle.  The 100% isodose line was prescribed.  6 MV X-rays were delivered in the flattening filter free beam mode.   STEREOTACTIC TREATMENT MANAGEMENT:  Following delivery, the patient was transported to nursing in stable condition and monitored for possible acute effects.  Vital signs were recorded BP 131/79 (BP Location: Left Arm, Patient Position:  Sitting, Cuff Size: Normal)   Pulse (!) 58   Temp 97.8 F (36.6 C)   Resp 20   SpO2 100%   PF (!) 2 L/min . The patient tolerated treatment without significant acute effects, and was discharged to home in stable condition.    PLAN: Follow-up in one month.  PET pending 9/20 to fully stage patient and determine extracranial status of oligometastatic versus widely metastatic disease.  ________________________________  Artist Pais. [name], M.D.

## 2023-07-31 NOTE — Radiation Completion Notes (Addendum)
Radiation Oncology         [phone] ________________________________  Name: [name] MRN: [id number]  Date: 07/30/2023  DOB: Jan 31, 1961  Patient Name: [name], [name] MRN: [id number] Date of Birth: 1961/06/05 Referring Physician: Julio Sicks, M.D. Date of Service: 2023-07-31 Radiation Oncologist: Margaretmary Bayley, M.D. [facility]     RADIATION ONCOLOGY END OF TREATMENT NOTE     Diagnosis: 62 y.o. 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   Intent: Palliative     ==========DELIVERED PLANS==========  First Treatment Date: 2023-07-30 - Last Treatment Date: 2023-07-30   Plan Name: Brain_SRS Site: Brain _PTV_1_Rt_Parietal_36mm Technique: SBRT/SRT-IMRT Mode: Photon Dose Per Fraction: 20 Gy Prescribed Dose (Delivered / Prescribed): 20 Gy / 20 Gy Prescribed Fxs (Delivered / Prescribed): 1 / 1     ==========ON TREATMENT VISIT DATES========== 2023-07-30   See weekly On Treatment Notes in Epic for details.  She tolerated the [facility] treatment well and was discharged home in stable condition.  The patient will receive a call in about one month from the radiation oncology department.  We will coordinate for a posttreatment MRI brain scan in December 2024 with a follow-up telephone visit thereafter to review results and recommendations.  She will continue follow up with her medical oncologist, Dr. Arbutus Ped, as well.  ------------------------------------------------   Margaretmary Dys, MD [facility]  Radiation Oncology Direct Dial: [phone]  Fax: [phone] [email or web address]  Skype  LinkedIn

## 2023-07-31 NOTE — Progress Notes (Signed)
Pharmacist Chemotherapy Monitoring - Initial Assessment    Anticipated start date: 08/07/23   The following has been reviewed per standard work regarding the patient's treatment regimen: The patient's diagnosis, treatment plan and drug doses, and organ/hematologic function Lab orders and baseline tests specific to treatment regimen  The treatment plan start date, drug sequencing, and pre-medications Prior authorization status  Patient's documented medication list, including drug-drug interaction screen and prescriptions for anti-emetics and supportive care specific to the treatment regimen The drug concentrations, fluid compatibility, administration routes, and timing of the medications to be used The patient's access for treatment and lifetime cumulative dose history, if applicable  The patient's medication allergies and previous infusion related reactions, if applicable   Changes made to treatment plan:  N/A  Follow up needed:  Pending authorization for treatment  for [name], [facility], 07/31/2023  4:32 PM

## 2023-08-01 ENCOUNTER — Encounter (HOSPITAL_COMMUNITY): Payer: Self-pay

## 2023-08-01 ENCOUNTER — Ambulatory Visit (HOSPITAL_COMMUNITY)
Admission: RE | Admit: 2023-08-01 | Discharge: 2023-08-01 | Disposition: A | Discharge status: Home / Self Care | Payer: 59 | Source: Ambulatory Visit | Attending: Internal Medicine | Admitting: Internal Medicine

## 2023-08-01 ENCOUNTER — Other Ambulatory Visit: Payer: Self-pay

## 2023-08-01 DIAGNOSIS — C50911 Malignant neoplasm of unspecified site of right female breast: Secondary | ICD-10-CM | POA: Insufficient documentation

## 2023-08-01 DIAGNOSIS — C7931 Secondary malignant neoplasm of brain: Secondary | ICD-10-CM | POA: Diagnosis not present

## 2023-08-01 DIAGNOSIS — D573 Sickle-cell trait: Secondary | ICD-10-CM | POA: Diagnosis not present

## 2023-08-01 DIAGNOSIS — J449 Chronic obstructive pulmonary disease, unspecified: Secondary | ICD-10-CM | POA: Diagnosis not present

## 2023-08-01 DIAGNOSIS — Z87891 Personal history of nicotine dependence: Secondary | ICD-10-CM | POA: Diagnosis not present

## 2023-08-01 DIAGNOSIS — Z8249 Family history of ischemic heart disease and other diseases of the circulatory system: Secondary | ICD-10-CM | POA: Diagnosis not present

## 2023-08-01 DIAGNOSIS — C3490 Malignant neoplasm of unspecified part of unspecified bronchus or lung: Secondary | ICD-10-CM | POA: Diagnosis present

## 2023-08-01 DIAGNOSIS — I10 Essential (primary) hypertension: Secondary | ICD-10-CM | POA: Insufficient documentation

## 2023-08-01 DIAGNOSIS — Z853 Personal history of malignant neoplasm of breast: Secondary | ICD-10-CM | POA: Diagnosis not present

## 2023-08-01 HISTORY — PX: IR IMAGING GUIDED PORT INSERTION: IMG5740

## 2023-08-01 LAB — MOLECULAR PATHOLOGY

## 2023-08-01 MED ORDER — FENTANYL CITRATE (PF) 100 MCG/2ML IJ SOLN
INTRAMUSCULAR | Status: AC | PRN
Start: 2023-08-01 — End: 2023-08-01
  Administered 2023-08-01: 50 ug via INTRAVENOUS

## 2023-08-01 MED ORDER — GELATIN ABSORBABLE 12-7 MM EX MISC
CUTANEOUS | Status: AC
  Filled 2023-08-01: qty 1

## 2023-08-01 MED ORDER — SODIUM CHLORIDE 0.9 % IV SOLN: INTRAVENOUS | Status: DC

## 2023-08-01 MED ORDER — MIDAZOLAM HCL 2 MG/2ML IJ SOLN
INTRAMUSCULAR | Status: AC
  Filled 2023-08-01: qty 2

## 2023-08-01 MED ORDER — LIDOCAINE-EPINEPHRINE 1 %-1:100000 IJ SOLN
INTRAMUSCULAR | Status: AC
  Filled 2023-08-01: qty 1

## 2023-08-01 MED ORDER — FENTANYL CITRATE (PF) 100 MCG/2ML IJ SOLN
INTRAMUSCULAR | Status: AC
  Filled 2023-08-01: qty 2

## 2023-08-01 MED ORDER — MIDAZOLAM HCL 2 MG/2ML IJ SOLN
INTRAMUSCULAR | Status: AC | PRN
Start: 2023-08-01 — End: 2023-08-01
  Administered 2023-08-01: 1 mg via INTRAVENOUS

## 2023-08-01 MED ORDER — HEPARIN SOD (PORK) LOCK FLUSH 100 UNIT/ML IV SOLN: 500.0000 [IU] | Freq: Once | INTRAVENOUS | Status: DC

## 2023-08-01 MED ORDER — LIDOCAINE-EPINEPHRINE 1 %-1:100000 IJ SOLN: 20.0000 mL | Freq: Once | INTRAMUSCULAR | Status: DC

## 2023-08-01 MED ORDER — HEPARIN SOD (PORK) LOCK FLUSH 100 UNIT/ML IV SOLN
INTRAVENOUS | Status: AC
  Filled 2023-08-01: qty 5

## 2023-08-01 NOTE — Consult Note (Signed)
Chief Complaint: Patient was seen in consultation today for Port-A Cathter Placement at the request of Mohamed,Mohamed  Supervising Physician: Gilmer Mor  Patient Status: [facility] - Out-pt  History of Present Illness:  [name] is a 62 y.o. female   Full Code Status per Patient COPD, HTN, Right Breast CA., Lung CA 9/6 MRI Brain:MPRESSION: 1.1 cm metastatic lesion in the right postcentral gyrus is similar in size to the prior study with unchanged surrounding edema. No new lesions  To start Chemotherapy in one week Scheduled now for Port-A-Catheter Placement in IR    Past Medical History:  Diagnosis Date   Acute on chronic respiratory failure (HCC) 10/09/2016   Allergic rhinitis    Breast cancer (HCC) 2016   right breast   COPD, mild (HCC) FOLLOWED BY DR PPIR   Depression    GERD (gastroesophageal reflux disease)    HTN (hypertension)    Hypertension    Iron deficiency anemia    Long-term current use of steroids SYMBICORT INHALER   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    Overweight (BMI 25.0-29.9) 05/11/2016   Palpitations    Personal history of radiation therapy 2016   Sarcoidosis STABLE PER CXR JUNE 2013   Shortness of breath    Sickle cell trait (HCC)     Past Surgical History:  Procedure Laterality Date   ADJUSTABLE SUTURE MANIPULATION  05/22/2012   Procedure: ADJUSTABLE SUTURE MANIPULATION;  Surgeon: Corinda Gubler, MD;  [facility];  Service: Ophthalmology;  Laterality: Right;   BREAST LUMPECTOMY Right 2016   BRONCHIAL BIOPSY  07/09/2023   Procedure: BRONCHIAL BIOPSIES;  Surgeon: Leslye Peer, MD;  Location[facility];  Service: Pulmonary;;   BRONCHIAL BRUSHINGS  07/09/2023   Procedure: BRONCHIAL BRUSHINGS;  Surgeon: Leslye Peer, MD;  Location: [facility] ENDOSCOPY;  Service: Pulmonary;;   BRONCHIAL NEEDLE ASPIRATION BIOPSY  07/09/2023   Procedure: BRONCHIAL NEEDLE ASPIRATION BIOPSIES;  Surgeon: Leslye Peer, MD;  Location: [facility] ENDOSCOPY;   Service: Pulmonary;;   CESAREAN SECTION  1986   W/ BILATERAL TUBAL LIGATION   COLONOSCOPY WITH PROPOFOL N/A 02/21/2019   Procedure: COLONOSCOPY WITH PROPOFOL;  Surgeon: Hilarie Fredrickson, MD;  Location: [facility] ENDOSCOPY;  Service: Endoscopy;  Laterality: N/A;   EYE SURGERY     MEDIAN RECTUS REPAIR  05/22/2012   Procedure: MEDIAN RECTUS REPAIR;  Surgeon: Corinda Gubler, MD;  Location: [facility];  Service: Ophthalmology;  Laterality: Bilateral;  INFERIOR RECTUS RESECTION WITH ADJUSTIBLE SUTURES RIGHT EYE    POLYPECTOMY  02/21/2019   Procedure: POLYPECTOMY;  Surgeon: Hilarie Fredrickson, MD;  Location: [facility] ENDOSCOPY;  Service: Endoscopy;;   RADIOACTIVE SEED GUIDED PARTIAL MASTECTOMY WITH AXILLARY SENTINEL LYMPH NODE BIOPSY Right 08/18/2015   Procedure: RADIOACTIVE SEED GUIDED PARTIAL MASTECTOMY WITH AXILLARY SENTINEL LYMPH NODE BIOPSY;  Surgeon: Almond Lint, MD;  Location: [facility];  Service: General;  Laterality: Right;   TOTAL ROBOTIC ASSISTED LAPAROSCOPIC HYSTERECTOMY  12-30-2010   SYMPTOMATIC UTERINE FIBROIDS   UPPER TEETH EXTRACTION'S  1992   VIDEO BRONCHOSCOPY WITH RADIAL ENDOBRONCHIAL ULTRASOUND  07/09/2023   Procedure: VIDEO BRONCHOSCOPY WITH RADIAL ENDOBRONCHIAL ULTRASOUND;  Surgeon: Leslye Peer, MD;  Location: [facility] ENDOSCOPY;  Service: Pulmonary;;    Allergies: Codeine  Medications: Prior to Admission medications   Medication Sig Start Date End Date Taking? Authorizing Provider  albuterol (VENTOLIN HFA) 108 (90 Base) MCG/ACT inhaler INHALE 2 PUFFS EVERY 6 HOURS AS NEEDED FOR WHEEZING OR SHORTNESS OF BREATH Patient taking  differently: Inhale 2 puffs into the lungs every 6 (six) hours as needed for shortness of breath or wheezing. 09/23/19  Yes Nyoka Cowden, MD  aspirin EC 81 MG tablet Take 1 tablet (81 mg total) by mouth daily. Start 02/23/19 02/21/19  Yes Pokhrel, Laxman, MD  atorvastatin (LIPITOR) 20 MG tablet TAKE 1 TABLET (20 MG TOTAL) BY MOUTH DAILY.  03/28/21  Yes Sandre Kitty, MD  dexamethasone (DECADRON) 4 MG tablet One twice daily with food until better then one daily 07/17/23  Yes Nyoka Cowden, MD  fluticasone (FLONASE) 50 MCG/ACT nasal spray USE 2 SPRAYS IN BOTH  NOSTRILS DAILY AS NEEDED  FOR ALLERGIES Patient taking differently: Place 2 sprays into both nostrils daily as needed for allergies. 08/15/21  Yes Nyoka Cowden, MD  hydroxychloroquine (PLAQUENIL) 200 MG tablet TAKE 1 TABLET BY MOUTH DAILY 03/19/23  Yes Nyoka Cowden, MD  metoprolol tartrate (LOPRESSOR) 25 MG tablet Take 1 tablet (25 mg total) by mouth daily. 07/09/23  Yes Leslye Peer, MD  montelukast (SINGULAIR) 10 MG tablet Take 10 mg by mouth at bedtime. 07/31/22  Yes [provider]  Multiple Vitamin (MULTIVITAMIN) capsule Take 1 capsule by mouth daily.   Yes [provider]  omeprazole (PRILOSEC) 20 MG capsule TAKE 1 CAPSULE EVERY DAY Patient taking differently: Take 20 mg by mouth daily. 01/03/21  Yes Sandre Kitty, MD  OXYGEN 2lpm with rest and 3lpm with exertion   Yes [provider]  sertraline (ZOLOFT) 50 MG tablet TAKE 1 TABLET EVERY DAY Patient taking differently: Take 50 mg by mouth daily. 09/11/20  Yes Sandre Kitty, MD  STIOLTO RESPIMAT 2.5-2.5 MCG/ACT AERS USE 2 INHALATIONS BY MOUTH DAILY 11/02/22  Yes Nyoka Cowden, MD  triamterene-hydrochlorothiazide (MAXZIDE-25) 37.5-25 MG tablet TAKE 1 TABLET EVERY DAY Patient taking differently: Take 1 tablet by mouth daily. TAKE 1 TABLET EVERY DAY 06/02/21  Yes Nyoka Cowden, MD  acetaminophen (TYLENOL) 325 MG tablet Take 650 mg by mouth every 6 (six) hours as needed for mild pain or moderate pain.    [provider]  albuterol (PROVENTIL) (2.5 MG/3ML) 0.083% nebulizer solution Take 3 mLs (2.5 mg total) by nebulization every 6 (six) hours as needed for wheezing or shortness of breath. 12/22/18   Osvaldo Shipper, MD  lidocaine-prilocaine (EMLA) cream Apply to affected area once  08/01/23   Si Gaul, MD  ondansetron (ZOFRAN) 4 MG tablet Take 1 tablet (4 mg total) by mouth every 6 (six) hours as needed for nausea. 06/28/23   Alwyn Ren, MD  prochlorperazine (COMPAZINE) 10 MG tablet Take 1 tablet (10 mg total) by mouth every 6 (six) hours as needed for nausea or vomiting. 08/01/23   Si Gaul, MD     Family History  Problem Relation Age of Onset   Hyperlipidemia Mother    Asthma Mother    Lupus Mother    Breast cancer Mother 78   Hypertension Father    Hyperlipidemia Father    Hypertension Sister    Hypertension Brother    Cancer Neg Hx    Diabetes Neg Hx    Coronary artery disease Neg Hx     Social History   Socioeconomic History   Marital status: Single    Spouse name: Not on file   Number of children: Not on file   Years of education: Not on file   Highest education level: Not on file  Occupational History   Not on file  Tobacco Use   Smoking status: Former    Current packs/day: 0.00    Average packs/day: 1 pack/day for 20.0 years (20.0 ttl pk-yrs)    Types: Cigarettes    Start date: 11/14/1983    Quit date: 11/14/2003    Years since quitting: 19.7   Smokeless tobacco: Never  Vaping Use   Vaping status: Never Used  Substance and Sexual Activity   Alcohol use: No   Drug use: No   Sexual activity: Not on file  Other Topics Concern   Not on file  Social History Narrative   Lives with mother and his 3 children.  Works as a Psychologist, educational for sewing Administrator, arts)         Social Determinants of Corporate investment banker Strain: Not on file  Food Insecurity: No Food Insecurity (07/23/2023)   Hunger Vital Sign    Worried About Running Out of Food in the Last Year: Never true    Ran Out of Food in the Last Year: Never true  Transportation Needs: No Transportation Needs (07/23/2023)   PRAPARE - Administrator, Civil Service (Medical): No    Lack of Transportation (Non-Medical): No  Physical Activity: Not on file   Stress: Not on file  Social Connections: Not on file      Review of Systems: A 12 point ROS discussed and pertinent positives are indicated in the HPI above.  All other systems are negative.  Review of Systems  Constitutional:  Positive for fever.  Respiratory:  Negative for shortness of breath.   Neurological:  Positive for numbness.       Numbness and Tingling Right upper extremities  Psychiatric/Behavioral:  Negative for confusion.     Vital Signs: BP 117/70   Pulse 81   Temp (!) 97.4 F (36.3 C) (Temporal)   Resp 19   Ht 5\' 5"  (1.651 m)   Wt 164 lb (74.4 kg)   SpO2 96%   BMI 27.29 kg/m     Physical Exam HENT:     Mouth/Throat:     Mouth: Mucous membranes are moist.  Cardiovascular:     Rate and Rhythm: Regular rhythm.  Pulmonary:     Effort: Pulmonary effort is normal.     Breath sounds: No wheezing.  Abdominal:     Palpations: Abdomen is soft.  Skin:    General: Skin is warm and dry.  Neurological:     Mental Status: She is alert.  Psychiatric:        Behavior: Behavior normal.     Imaging: MR Brain W Wo Contrast  Result Date: 07/20/2023 CLINICAL DATA:  Metastatic disease, assess treatment response EXAM: MRI HEAD WITHOUT AND WITH CONTRAST TECHNIQUE: Multiplanar, multiecho pulse sequences of the brain and surrounding structures were obtained without and with intravenous contrast. CONTRAST:  7mL GADAVIST GADOBUTROL 1 MMOL/ML IV SOLN COMPARISON:  Brain MRI 06/26/2023 FINDINGS: Brain: There is ring-enhancing lesion in the right postcentral gyrus measuring 1.1 cm x 1.2 cm, not significantly changed compared to the prior study. There is surrounding vasogenic edema with mild swelling but no midline shift. There are no other lesions. There is no acute intracranial hemorrhage, extra-axial fluid collection, or acute infarct. Parenchymal volume is normal. The ventricles are normal in size. There is a minimal background chronic small-vessel ischemic change The pituitary  and suprasellar region are normal. Vascular: Normal flow voids. Skull and upper cervical spine: Normal marrow signal. Sinuses/Orbits: The paranasal sinuses are clear. Bilateral lens implants are  in place. The globes and orbits are otherwise unremarkable. Other: The mastoid air cells and middle ear cavities are clear. 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 No new lesions. Electronically Signed   By: Lesia Hausen M.D.   On: 07/20/2023 16:02   DG Chest Port 1 View  Result Date: 07/09/2023 CLINICAL DATA:  Status post bronchoscopy with biopsy. EXAM: PORTABLE CHEST 1 VIEW COMPARISON:  September 13, 2022.  June 25, 2022. FINDINGS: Stable cardiomediastinal silhouette. Stable large right upper lobe mass is noted. No definite pneumothorax is noted status post bronchoscopy. Bony thorax is unremarkable. IMPRESSION: No pneumothorax status post bronchoscopy. Large right upper lobe mass is noted as described on prior CT scan. Electronically Signed   By: Lupita Raider M.D.   On: 07/09/2023 10:43   DG C-ARM BRONCHOSCOPY  Result Date: 07/09/2023 C-ARM BRONCHOSCOPY: Fluoroscopy was utilized by the requesting physician.  No radiographic interpretation.    Labs:  CBC: Recent Labs    09/13/22 1443 06/26/23 1629 06/27/23 1252 07/26/23 1347  WBC 8.2 12.2* 13.0* 14.8*  HGB 8.3* 9.6* 9.0* 10.1*  HCT 26.2* 31.0* 29.6* 31.3*  PLT 239 384 386 416*    COAGS: No results for input(s): "INR", "APTT" in the last 8760 hours.  BMP: Recent Labs    09/13/22 1443 06/26/23 1629 06/27/23 1252 07/26/23 1347  NA 144 137 136 142  K 3.0* 4.3 4.2 3.6  CL 107 92* 96* 99  CO2 30 31 28  37*  GLUCOSE 94 113* 220* 82  BUN 9 16 24* 22  CALCIUM 7.8* 9.5 9.1 9.1  CREATININE 0.83 1.24* 1.25* 1.02*  GFRNONAA >60 49* 48* >60    LIVER FUNCTION TESTS: Recent Labs    06/27/23 1252 07/26/23 1347  BILITOT 0.4 0.4  AST 13* 11*   ALT 7 10  ALKPHOS 65 75  PROT 7.2 6.6  ALBUMIN 3.2* 3.6    TUMOR MARKERS: No results for input(s): "AFPTM", "CEA", "CA199", "CHROMGRNA" in the last 8760 hours.  Assessment and Plan: Scheduled today for Port-A Catheter Placement  Risks and benefits of image guided port-a-catheter placement was discussed with the patient including, but not limited to bleeding, infection, pneumothorax, or fibrin sheath development and need for additional procedures.  All of the patient's questions were answered, patient is agreeable to proceed. Consent signed and in chart.  Thank you for this interesting consult.  I greatly enjoyed meeting [name] and look forward to participating in their care.  A copy of this report was sent to the requesting provider on this date.  Electronically Signed: Robet Leu, PA-C 08/01/2023, 9:45 AM   I spent a total of 20 Minutes    in face to face in clinical consultation, greater than 50% of which was counseling/coordinating care for Port-A-Catheter Placement

## 2023-08-01 NOTE — Procedures (Signed)
Interventional Radiology Procedure Note  Procedure: Placement of a right IJ approach single lumen PowerPort.  Tip is positioned at the superior cavoatrial junction and catheter is ready for immediate use.  Complications: None Recommendations:  - Ok to shower tomorrow - Do not submerge for 7 days - Routine line care  - advance diet - DC 1 hr  Signed,  Yvone Neu. Loreta Ave, DO

## 2023-08-02 ENCOUNTER — Other Ambulatory Visit: Payer: 59

## 2023-08-02 ENCOUNTER — Ambulatory Visit: Payer: 59

## 2023-08-03 ENCOUNTER — Encounter (HOSPITAL_COMMUNITY)
Admission: RE | Admit: 2023-08-03 | Discharge: 2023-08-03 | Disposition: A | Discharge status: Home / Self Care | Payer: 59 | Source: Ambulatory Visit | Attending: Radiation Oncology | Admitting: Radiation Oncology

## 2023-08-03 DIAGNOSIS — C3491 Malignant neoplasm of unspecified part of right bronchus or lung: Secondary | ICD-10-CM | POA: Insufficient documentation

## 2023-08-03 DIAGNOSIS — C7931 Secondary malignant neoplasm of brain: Secondary | ICD-10-CM | POA: Diagnosis present

## 2023-08-03 LAB — GLUCOSE, CAPILLARY: Glucose-Capillary: 88 mg/dL (ref 70–99)

## 2023-08-03 MED ORDER — FLUDEOXYGLUCOSE F - 18 (FDG) INJECTION
8.1400 | Freq: Once | INTRAVENOUS | Status: AC
  Administered 2023-08-03: 8.14 via INTRAVENOUS

## 2023-08-06 MED FILL — Fosaprepitant Dimeglumine For IV Infusion 150 MG (Base Eq): INTRAVENOUS | Qty: 5 | Status: AC

## 2023-08-06 MED FILL — Dexamethasone Sodium Phosphate Inj 100 MG/10ML: INTRAMUSCULAR | Qty: 1 | Status: AC

## 2023-08-07 ENCOUNTER — Inpatient Hospital Stay: Payer: 59

## 2023-08-07 ENCOUNTER — Other Ambulatory Visit: Payer: Self-pay | Admitting: Internal Medicine

## 2023-08-07 ENCOUNTER — Other Ambulatory Visit: Payer: Self-pay

## 2023-08-07 ENCOUNTER — Other Ambulatory Visit: Payer: Self-pay | Admitting: Physician Assistant

## 2023-08-07 ENCOUNTER — Telehealth: Payer: Self-pay | Admitting: Radiation Therapy

## 2023-08-07 ENCOUNTER — Encounter: Payer: Self-pay | Admitting: Internal Medicine

## 2023-08-07 DIAGNOSIS — Z95828 Presence of other vascular implants and grafts: Secondary | ICD-10-CM | POA: Insufficient documentation

## 2023-08-07 DIAGNOSIS — C3491 Malignant neoplasm of unspecified part of right bronchus or lung: Secondary | ICD-10-CM

## 2023-08-07 DIAGNOSIS — Z452 Encounter for adjustment and management of vascular access device: Secondary | ICD-10-CM | POA: Diagnosis not present

## 2023-08-07 LAB — TSH: TSH: 1.108 u[IU]/mL (ref 0.350–4.500)

## 2023-08-07 LAB — CBC WITH DIFFERENTIAL (CANCER CENTER ONLY)
Abs Immature Granulocytes: 0.13 10*3/uL — ABNORMAL HIGH (ref 0.00–0.07)
Basophils Absolute: 0.1 10*3/uL (ref 0.0–0.1)
Basophils Relative: 0 %
Eosinophils Absolute: 0.1 10*3/uL (ref 0.0–0.5)
Eosinophils Relative: 1 %
HCT: 29.5 % — ABNORMAL LOW (ref 36.0–46.0)
Hemoglobin: 9.5 g/dL — ABNORMAL LOW (ref 12.0–15.0)
Immature Granulocytes: 1 %
Lymphocytes Relative: 9 %
Lymphs Abs: 1.4 10*3/uL (ref 0.7–4.0)
MCH: 25.3 pg — ABNORMAL LOW (ref 26.0–34.0)
MCHC: 32.2 g/dL (ref 30.0–36.0)
MCV: 78.7 fL — ABNORMAL LOW (ref 80.0–100.0)
Monocytes Absolute: 0.9 10*3/uL (ref 0.1–1.0)
Monocytes Relative: 6 %
Neutro Abs: 13 10*3/uL — ABNORMAL HIGH (ref 1.7–7.7)
Neutrophils Relative %: 83 %
Platelet Count: 300 10*3/uL (ref 150–400)
RBC: 3.75 MIL/uL — ABNORMAL LOW (ref 3.87–5.11)
RDW: 17.5 % — ABNORMAL HIGH (ref 11.5–15.5)
WBC Count: 15.5 10*3/uL — ABNORMAL HIGH (ref 4.0–10.5)
nRBC: 0 % (ref 0.0–0.2)

## 2023-08-07 LAB — CMP (CANCER CENTER ONLY)
ALT: 9 U/L (ref 0–44)
AST: 10 U/L — ABNORMAL LOW (ref 15–41)
Albumin: 3.4 g/dL — ABNORMAL LOW (ref 3.5–5.0)
Alkaline Phosphatase: 69 U/L (ref 38–126)
Anion gap: 5 (ref 5–15)
BUN: 25 mg/dL — ABNORMAL HIGH (ref 8–23)
CO2: 36 mmol/L — ABNORMAL HIGH (ref 22–32)
Calcium: 9.1 mg/dL (ref 8.9–10.3)
Chloride: 105 mmol/L (ref 98–111)
Creatinine: 0.94 mg/dL (ref 0.44–1.00)
GFR, Estimated: >60 mL/min (ref >60)
Glucose, Bld: 82 mg/dL (ref 70–99)
Potassium: 3.7 mmol/L (ref 3.5–5.1)
Sodium: 146 mmol/L — ABNORMAL HIGH (ref 135–145)
Total Bilirubin: 0.3 mg/dL (ref 0.3–1.2)
Total Protein: 6.4 g/dL — ABNORMAL LOW (ref 6.5–8.1)

## 2023-08-07 MED ORDER — SODIUM CHLORIDE 0.9% FLUSH
10.0000 mL | Freq: Once | INTRAVENOUS | Status: AC
  Administered 2023-08-07: 10 mL

## 2023-08-07 MED ORDER — HEPARIN SOD (PORK) LOCK FLUSH 100 UNIT/ML IV SOLN
500.0000 [IU] | Freq: Once | INTRAVENOUS | Status: AC
  Administered 2023-08-07: 500 [IU]

## 2023-08-07 NOTE — Telephone Encounter (Signed)
I left a detailed message requesting a call back regarding her upcoming radiation treatment planning appointments.   Jalene Mullet R.T.(R)(T) Radiation Special Procedures Navigator

## 2023-08-08 ENCOUNTER — Ambulatory Visit
Admission: RE | Admit: 2023-08-08 | Discharge: 2023-08-08 | Disposition: A | Discharge status: Home / Self Care | Payer: 59 | Source: Ambulatory Visit | Attending: Urology | Admitting: Urology

## 2023-08-08 ENCOUNTER — Encounter: Payer: Self-pay | Admitting: Urology

## 2023-08-08 ENCOUNTER — Ambulatory Visit
Admission: RE | Admit: 2023-08-08 | Discharge: 2023-08-08 | Disposition: A | Discharge status: Home / Self Care | Payer: 59 | Source: Ambulatory Visit | Attending: Radiation Oncology

## 2023-08-08 ENCOUNTER — Ambulatory Visit: Payer: 59 | Admitting: Radiation Oncology

## 2023-08-08 VITALS — Ht 65.0 in | Wt 164.0 lb

## 2023-08-08 DIAGNOSIS — C3411 Malignant neoplasm of upper lobe, right bronchus or lung: Secondary | ICD-10-CM

## 2023-08-08 DIAGNOSIS — C3490 Malignant neoplasm of unspecified part of unspecified bronchus or lung: Secondary | ICD-10-CM | POA: Insufficient documentation

## 2023-08-08 DIAGNOSIS — C412 Malignant neoplasm of vertebral column: Secondary | ICD-10-CM

## 2023-08-08 DIAGNOSIS — C3491 Malignant neoplasm of unspecified part of right bronchus or lung: Secondary | ICD-10-CM

## 2023-08-08 DIAGNOSIS — C7951 Secondary malignant neoplasm of bone: Secondary | ICD-10-CM

## 2023-08-08 LAB — T4: T4, Total: 7 ug/dL (ref 4.5–12.0)

## 2023-08-08 NOTE — Progress Notes (Signed)
[facility]         [phone]) [phone] ________________________________  Outpatient Re-Consultation- Conducted via telephone due to current COVID-19 concerns for limiting patient exposure  Name: ALLANNA ARROLIGA MRN: [id number]  Date: 08/08/2023  DOB: 02/25/1961  REFERRING PHYSICIAN: Si Gaul, MD  DIAGNOSIS: 62 y.o. 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     ICD-10-CM   1. Squamous cell lung cancer, right (HCC)  C34.91     2. Malignant neoplasm of right upper lobe of lung (HCC)  C34.11     3. Malignant neoplasm of spine (HCC)  C41.2     4. 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 female with a past medical history significant for sarcoidosis, breast cancer, and COPD.  We initially met her on 07/23/2023 at the request of Dr. Jordan Likes, to discuss treatment of a solitary brain metastasis found at the time of her recent ER visit. She has a long history of sarcoidosis with multiple pulmonary reticular nodules. In March of 2023 she had a CT of her chest which showed a 1.8 x 2.2 spiculated mass in the RUL. Pulmonology decided to follow-up with a chest X-ray in November 2023. CXR showed stable COPD/emphysema and right mid lung scaring, but no interval change so the decision was made to continue to monitor.  More recently, she presented to the ER on 06/26/23 with an acute onset of left arm numbness and paresthesias. An MRI of the brain showed a 1 cm cystic mass involving the right parietal lobe and postcentral gyrus along with vasogenic edema without midline shift was seen. CT of the chest on admission showed a 6.4 cm x 4.9 cm lobulated right apical mass with a T3 lesion suspicious for osseous metastatic disease. She was admitted for further workup and seen by neurosurgeon, Dr. Jordan Likes who felt that the patient's brain lesion was most  likely a metastatic lesion and based on the location of the lesion being in an eloquent region of the brain, did not recommend biopsy or resection but rather treat this with [facility] as an outpatient. She was started on decadron and discharged home on 06/28/23.   Pulmonology was consulted and recommended outpatient bronchoscopy for biopsy of the RUL lung mass which was performed on 07/09/23 under the care of Dr. [name]. Final surgical pathology revealed squamous cell carcinoma.  She had a 3T MRI brain on 07/20/23 which confirmed a 1.1 cm metastatic lesion in the right postcentral gyrus similar in size to the prior study with unchanged surrounding edema and no new lesions seen. We met with the patient in consult on 07/23/23 and she elected to proceed with the recommended single fraction stereotactic radiosurgery [facility]).  She met with Dr. Arbutus Ped on 07/26/2023 to discuss her treatment options but her staging had not yet been completed as her PET scan was scheduled for 08/03/23. Her SRS brain treatment was completed on 07/30/23 and tolerated well. She has tapered off the decadron without issue and is currently without complaints. Her recent PET scan showed   Of note, patient underwent intense tracer avidity in the RUL lung mass, consistent with primary bronchogenic carcinoma but no tracer avid mediastinal or hilar lymph nodes.  There was a solitary tracer avid skeletal lesion involving the T1 vertebra, consistent with osseous metastatic disease but no  additional sites of metastatic disease identified.   We were kindly asked to reconsult the patient to discuss radiation therapy options for the lung mass and oligometastatic disease involving T1.    PREVIOUS RADIATION THERAPY: Yes  07/30/23: The solitary brain metastasis in the right postcentral gyrus was treated to 20 Gray in a single fraction of SRS.  2016: Patient received 50.05 Gy in 20 fractions to her right breast under the care of Dr. Basilio Cairo   Past Medical  History:  Diagnosis Date   Acute on chronic respiratory failure (HCC) 10/09/2016   Allergic rhinitis    Breast cancer (HCC) 2016   right breast   COPD, mild (HCC) FOLLOWED BY DR WUJW   Depression    GERD (gastroesophageal reflux disease)    HTN (hypertension)    Hypertension    Iron deficiency anemia    Long-term current use of steroids SYMBICORT INHALER   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    Overweight (BMI 25.0-29.9) 05/11/2016   Palpitations    Personal history of radiation therapy 2016   Sarcoidosis STABLE PER CXR JUNE 2013   Shortness of breath    Sickle cell trait (HCC)   :   Past Surgical History:  Procedure Laterality Date   ADJUSTABLE SUTURE MANIPULATION  05/22/2012   Procedure: ADJUSTABLE SUTURE MANIPULATION;  Surgeon: Corinda Gubler, MD;  Location: [facility];  Service: Ophthalmology;  Laterality: Right;   BREAST LUMPECTOMY Right 2016   BRONCHIAL BIOPSY  07/09/2023   Procedure: BRONCHIAL BIOPSIES;  Surgeon: Leslye Peer, MD;  Location: [facility] ENDOSCOPY;  Service: Pulmonary;;   BRONCHIAL BRUSHINGS  07/09/2023   Procedure: BRONCHIAL BRUSHINGS;  Surgeon: Leslye Peer, MD;  Location: [facility] ENDOSCOPY;  Service: Pulmonary;;   BRONCHIAL NEEDLE ASPIRATION BIOPSY  07/09/2023   Procedure: BRONCHIAL NEEDLE ASPIRATION BIOPSIES;  Surgeon: Leslye Peer, MD;  Location: [facility] ENDOSCOPY;  Service: Pulmonary;;   CESAREAN SECTION  1986   W/ BILATERAL TUBAL LIGATION   COLONOSCOPY WITH PROPOFOL N/A 02/21/2019   Procedure: COLONOSCOPY WITH PROPOFOL;  Surgeon: Hilarie Fredrickson, MD;  Location: [facility] ENDOSCOPY;  Service: Endoscopy;  Laterality: N/A;   EYE SURGERY     IR IMAGING GUIDED PORT INSERTION  08/01/2023   MEDIAN RECTUS REPAIR  05/22/2012   Procedure: MEDIAN RECTUS REPAIR;  Surgeon: Corinda Gubler, MD;  Location: [facility];  Service: Ophthalmology;  Laterality: Bilateral;  INFERIOR RECTUS RESECTION WITH ADJUSTIBLE SUTURES RIGHT EYE    POLYPECTOMY  02/21/2019   Procedure:  POLYPECTOMY;  Surgeon: Hilarie Fredrickson, MD;  Location: [facility] ENDOSCOPY;  Service: Endoscopy;;   RADIOACTIVE SEED GUIDED PARTIAL MASTECTOMY WITH AXILLARY SENTINEL LYMPH NODE BIOPSY Right 08/18/2015   Procedure: RADIOACTIVE SEED GUIDED PARTIAL MASTECTOMY WITH AXILLARY SENTINEL LYMPH NODE BIOPSY;  Surgeon: Almond Lint, MD;  Location: [facility];  Service: General;  Laterality: Right;   TOTAL ROBOTIC ASSISTED LAPAROSCOPIC HYSTERECTOMY  12-30-2010   SYMPTOMATIC UTERINE FIBROIDS   UPPER TEETH EXTRACTION'S  1992   VIDEO BRONCHOSCOPY WITH RADIAL ENDOBRONCHIAL ULTRASOUND  07/09/2023   Procedure: VIDEO BRONCHOSCOPY WITH RADIAL ENDOBRONCHIAL ULTRASOUND;  Surgeon: Leslye Peer, MD;  Location: [facility] ENDOSCOPY;  Service: Pulmonary;;  :   Current Outpatient Medications:    acetaminophen (TYLENOL) 325 MG tablet, Take 650 mg by mouth every 6 (six) hours as needed for mild pain or moderate pain., Disp: , Rfl:    albuterol (PROVENTIL) (2.5 MG/3ML) 0.083% nebulizer solution, Take 3 mLs (2.5 mg total) by nebulization every 6 (six) hours  as needed for wheezing or shortness of breath., Disp: 75 mL, Rfl: 1   albuterol (VENTOLIN HFA) 108 (90 Base) MCG/ACT inhaler, INHALE 2 PUFFS EVERY 6 HOURS AS NEEDED FOR WHEEZING OR SHORTNESS OF BREATH (Patient taking differently: Inhale 2 puffs into the lungs every 6 (six) hours as needed for shortness of breath or wheezing.), Disp: 54 g, Rfl: 0   aspirin EC 81 MG tablet, Take 1 tablet (81 mg total) by mouth daily. Start 02/23/19, Disp: , Rfl:    atorvastatin (LIPITOR) 20 MG tablet, TAKE 1 TABLET (20 MG TOTAL) BY MOUTH DAILY., Disp: 90 tablet, Rfl: 3   dexamethasone (DECADRON) 4 MG tablet, One twice daily with food until better then one daily, Disp: 60 tablet, Rfl: 2   fluticasone (FLONASE) 50 MCG/ACT nasal spray, USE 2 SPRAYS IN BOTH  NOSTRILS DAILY AS NEEDED  FOR ALLERGIES (Patient taking differently: Place 2 sprays into both nostrils daily as needed for allergies.), Disp: 48  g, Rfl: 11   hydroxychloroquine (PLAQUENIL) 200 MG tablet, TAKE 1 TABLET BY MOUTH DAILY, Disp: 100 tablet, Rfl: 2   lidocaine-prilocaine (EMLA) cream, Apply to affected area once, Disp: 30 g, Rfl: 3   metoprolol tartrate (LOPRESSOR) 25 MG tablet, Take 1 tablet (25 mg total) by mouth daily., Disp: , Rfl:    montelukast (SINGULAIR) 10 MG tablet, Take 10 mg by mouth at bedtime., Disp: , Rfl:    Multiple Vitamin (MULTIVITAMIN) capsule, Take 1 capsule by mouth daily., Disp: , Rfl:    omeprazole (PRILOSEC) 20 MG capsule, TAKE 1 CAPSULE EVERY DAY (Patient taking differently: Take 20 mg by mouth daily.), Disp: 90 capsule, Rfl: 1   ondansetron (ZOFRAN) 4 MG tablet, Take 1 tablet (4 mg total) by mouth every 6 (six) hours as needed for nausea., Disp: 20 tablet, Rfl: 0   OXYGEN, 2lpm with rest and 3lpm with exertion, Disp: , Rfl:    prochlorperazine (COMPAZINE) 10 MG tablet, Take 1 tablet (10 mg total) by mouth every 6 (six) hours as needed for nausea or vomiting., Disp: 30 tablet, Rfl: 1   sertraline (ZOLOFT) 50 MG tablet, TAKE 1 TABLET EVERY DAY (Patient taking differently: Take 50 mg by mouth daily.), Disp: 90 tablet, Rfl: 1   STIOLTO RESPIMAT 2.5-2.5 MCG/ACT AERS, USE 2 INHALATIONS BY MOUTH DAILY, Disp: 12 g, Rfl: 3   triamterene-hydrochlorothiazide (MAXZIDE-25) 37.5-25 MG tablet, TAKE 1 TABLET EVERY DAY (Patient taking differently: Take 1 tablet by mouth daily. TAKE 1 TABLET EVERY DAY), Disp: 90 tablet, Rfl: 1:   Allergies  Allergen Reactions   Codeine Other (See Comments)    Avoids-felt bad when took before Pt claims she can have this but does not like to take.  :   Family History  Problem Relation Age of Onset   Hyperlipidemia Mother    Asthma Mother    Lupus Mother    Breast cancer Mother 82   Hypertension Father    Hyperlipidemia Father    Hypertension Sister    Hypertension Brother    Cancer Neg Hx    Diabetes Neg Hx    Coronary artery disease Neg Hx   :   Social History    Socioeconomic History   Marital status: Single    Spouse name: Not on file   Number of children: Not on file   Years of education: Not on file   Highest education level: Not on file  Occupational History   Not on file  Tobacco Use   Smoking status: Former  Current packs/day: 0.00    Average packs/day: 1 pack/day for 20.0 years (20.0 ttl pk-yrs)    Types: Cigarettes    Start date: 11/14/1983    Quit date: 11/14/2003    Years since quitting: 19.7   Smokeless tobacco: Never  Vaping Use   Vaping status: Never Used  Substance and Sexual Activity   Alcohol use: No   Drug use: No   Sexual activity: Not on file  Other Topics Concern   Not on file  Social History Narrative   Lives with mother and his 3 children.  Works as a Psychologist, educational for sewing Administrator, arts)         Social Determinants of Corporate investment banker Strain: Not on file  Food Insecurity: No Food Insecurity (07/23/2023)   Hunger Vital Sign    Worried About Running Out of Food in the Last Year: Never true    Ran Out of Food in the Last Year: Never true  Transportation Needs: No Transportation Needs (07/23/2023)   PRAPARE - Administrator, Civil Service (Medical): No    Lack of Transportation (Non-Medical): No  Physical Activity: Not on file  Stress: Not on file  Social Connections: Not on file  Intimate Partner Violence: Not At Risk (07/23/2023)   Humiliation, Afraid, Rape, and Kick questionnaire    Fear of Current or Ex-Partner: No    Emotionally Abused: No    Physically Abused: No    Sexually Abused: No  :  REVIEW OF SYSTEMS:  Patient is experiencing left hand numbness. She denies any headaches, visual or auditory disturbances, numbness, tingling, or weakness. She denies any cognitive or memory deficits. She denies any back pain.    PHYSICAL EXAM:  Height 5\' 5"  (1.651 m), weight 164 lb (74.4 kg). Unable to assess due to telephone reconsult visit format.  KPS = 90  100 - Normal; no complaints; no  evidence of disease. 90   - Able to carry on normal activity; minor signs or symptoms of disease. 80   - Normal activity with effort; some signs or symptoms of disease. 9   - Cares for self; unable to carry on normal activity or to do active work. 60   - Requires occasional assistance, but is able to care for most of his personal needs. 50   - Requires considerable assistance and frequent medical care. 40   - Disabled; requires special care and assistance. 30   - Severely disabled; hospital admission is indicated although death not imminent. 20   - Very sick; hospital admission necessary; active supportive treatment necessary. 10   - Moribund; fatal processes progressing rapidly. 0     - Dead  Karnofsky DA, Abelmann WH, Craver LS and Burchenal [facility] [phone]) The use of the nitrogen mustards in the palliative treatment of carcinoma: with particular reference to bronchogenic carcinoma Cancer 1 634-56  LABORATORY DATA:  Lab Results  Component Value Date   WBC 15.5 (H) 08/07/2023   HGB 9.5 (L) 08/07/2023   HCT 29.5 (L) 08/07/2023   MCV 78.7 (L) 08/07/2023   PLT 300 08/07/2023   Lab Results  Component Value Date   NA 146 (H) 08/07/2023   K 3.7 08/07/2023   CL 105 08/07/2023   CO2 36 (H) 08/07/2023   Lab Results  Component Value Date   ALT 9 08/07/2023   AST 10 (L) 08/07/2023   ALKPHOS 69 08/07/2023   BILITOT 0.3 08/07/2023     RADIOGRAPHY: NM PET Image Initial (  PI) Skull Base To Thigh  Result Date: 08/06/2023 CLINICAL DATA:  Initial treatment strategy for lung cancer. EXAM: NUCLEAR MEDICINE PET SKULL BASE TO THIGH TECHNIQUE: 8.14 mCi F-18 FDG was injected intravenously. Full-ring PET imaging was performed from the skull base to thigh after the radiotracer. CT data was obtained and used for attenuation correction and anatomic localization. Fasting blood glucose: 88 mg/dl COMPARISON:  29/56/2130 FINDINGS: Mediastinal blood pool activity: SUV max 2.96 Liver activity: SUV max NA NECK: No  hypermetabolic lymph nodes in the neck. Incidental CT findings: None. CHEST: Right upper lobe lung mass measures 6.1 x 4.7 cm with SUV max of 25.10, image 54/4. Central photopenia within this mass reflects internal necrosis. No additional tracer avid nodule or mass identified. No tracer avid mediastinal or hilar lymph nodes. The nodule within the posterior left lower lobe measuring 9 mm does not show any significant tracer activity, image 87/4. Incidental CT findings: Scattered areas of parenchymal scarring and architectural distortion identified bilaterally. Emphysema. Aortic atherosclerosis. ABDOMEN/PELVIS: No abnormal hypermetabolic activity within the liver, pancreas, adrenal glands, or spleen. No hypermetabolic lymph nodes in the abdomen or pelvis. Incidental CT findings: Aortic atherosclerotic calcifications. SKELETON: Tracer avid lesion involving the T1 vertebra has an SUV max of 8.62, image 44 the fused PET-CT images. No corresponding lytic or sclerotic changes identified on the CT images. Incidental CT findings: None. IMPRESSION: 1. Right upper lobe lung mass is intensely tracer avid and consistent with primary bronchogenic carcinoma. 2. No tracer avid mediastinal or hilar lymph nodes. 3. Tracer avid lesion involving the T1 vertebra is consistent with osseous metastatic disease. 4. No additional sites of metastatic disease identified. 5. Aortic Atherosclerosis (ICD10-I70.0). Electronically Signed   By: Signa Kell M.D.   On: 08/06/2023 10:07   IR IMAGING GUIDED PORT INSERTION  Result Date: 08/01/2023 INDICATION: 62 year old female referred for port catheter EXAM: IMAGE GUIDED PORT CATHETER MEDICATIONS: None ANESTHESIA/SEDATION: Moderate (conscious) sedation was employed during this procedure. A total of Versed 1.0 mg and Fentanyl 50 mcg was administered intravenously. Moderate Sedation Time: 19 minutes. The patient's level of consciousness and vital signs were monitored continuously by radiology  nursing throughout the procedure under my direct supervision. FLUOROSCOPY TIME:  CT COMPLICATIONS: None PROCEDURE: Informed written consent was obtained from the patient after a discussion of the risks, benefits, and alternatives to treatment. Questions regarding the procedure were encouraged and answered. The right neck and chest were prepped with chlorhexidine in a sterile fashion, and a sterile drape was applied covering the operative field. Maximum barrier sterile technique with sterile gowns and gloves were used for the procedure. A timeout was performed prior to the initiation of the procedure. Ultrasound survey was performed with images stored and sent to PACs. Right IJ vein documented to be patent. The right neck and chest was prepped with chlorhexidine, and draped in the usual sterile fashion using maximum barrier technique (cap and mask, sterile gown, sterile gloves, large sterile sheet, hand hygiene and cutaneous antiseptic). Local anesthesia was attained by infiltration with 1% lidocaine without epinephrine. Ultrasound demonstrated patency of the right internal jugular vein, and this was documented with an image. Under real-time ultrasound guidance, this vein was accessed with a 21 gauge micropuncture needle and image documentation was performed. A small dermatotomy was made at the access site with an 11 scalpel. A 0.018" wire was advanced into the SVC and used to estimate the length of the internal catheter. The access needle exchanged for a 23F micropuncture vascular sheath. The 0.018" wire  was then removed and a 0.035" wire advanced into the IVC. An appropriate location for the subcutaneous reservoir was selected below the clavicle and an incision was made through the skin and underlying soft tissues. The subcutaneous tissues were then dissected using a combination of blunt and sharp surgical technique and a pocket was formed. A single lumen power injectable portacatheter was then tunneled through the  subcutaneous tissues from the pocket to the dermatotomy and the port reservoir placed within the subcutaneous pocket. The venous access site was then serially dilated and a peel away vascular sheath placed over the wire. The wire was removed and the port catheter advanced into position under fluoroscopic guidance. The catheter tip is positioned in the cavoatrial junction. This was documented with a spot image. The portacatheter was then tested and found to flush and aspirate well. The port was flushed with saline followed by 100 units/mL heparinized saline. The pocket was then closed in two layers using first subdermal inverted interrupted absorbable sutures followed by a running subcuticular suture. The epidermis was then sealed with Dermabond. The dermatotomy at the venous access site was also seal with Dermabond. Patient tolerated the procedure well and remained hemodynamically stable throughout. No complications encountered and no significant blood loss encountered IMPRESSION: Status post right IJ port catheter placement Signed, Yvone Neu. Miachel Roux, RPVI [facility] Electronically Signed   By: Gilmer Mor D.O.   On: 08/01/2023 12:29   MR Brain W Wo Contrast  Result Date: 07/20/2023 CLINICAL DATA:  Metastatic disease, assess treatment response EXAM: MRI HEAD WITHOUT AND WITH CONTRAST TECHNIQUE: Multiplanar, multiecho pulse sequences of the brain and surrounding structures were obtained without and with intravenous contrast. CONTRAST:  7mL GADAVIST GADOBUTROL 1 MMOL/ML IV SOLN COMPARISON:  Brain MRI 06/26/2023 FINDINGS: Brain: There is ring-enhancing lesion in the right postcentral gyrus measuring 1.1 cm x 1.2 cm, not significantly changed compared to the prior study. There is surrounding vasogenic edema with mild swelling but no midline shift. There are no other lesions. There is no acute intracranial hemorrhage, extra-axial fluid collection,  or acute infarct. Parenchymal volume is normal. The ventricles are normal in size. There is a minimal background chronic small-vessel ischemic change The pituitary and suprasellar region are normal. Vascular: Normal flow voids. Skull and upper cervical spine: Normal marrow signal. Sinuses/Orbits: The paranasal sinuses are clear. Bilateral lens implants are in place. The globes and orbits are otherwise unremarkable. Other: The mastoid air cells and middle ear cavities are clear. 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 No new lesions. Electronically Signed   By: Lesia Hausen M.D.   On: 07/20/2023 16:02      ASSESSMENT/PLAN: This visit was conducted via telephone to spare the patient unnecessary potential exposure in the healthcare setting during the current COVID-19 pandemic.    62 y.o. 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   Today, I talked to the patient about the findings and workup thus far. We discussed the natural history of oligometastatic non-small cell lung carcinoma and general treatment, highlighting the role of radiotherapy in the management. We discussed the available radiation techniques, and focused on the details and logistics of delivery.  After personally reviewing the PET imaging with Dr. [name], the recommendation is for an 8 fraction course of stereotactic body radiotherapy (SBRT) to the large  RUL lung lesion and a 5 fraction course of stereotactic body radiotherapy  (SBRT), delivered concurrently, to the solitary osseous metastasis at T1.  We reviewed the anticipated acute and late sequelae associated with radiation in this setting. The patient was encouraged to ask questions that were answered to her stated satisfaction and she is in agreement to proceed.   She is tentatively scheduled for CT simulation/treatment planning  on 08/09/2023 in anticipation of beginning her daily radiation treatments on 08/13/2023, as to complete the radiation prior to starting systemic chemotherapy.  We will share our discussion with Dr. Arbutus Ped and his team so that they are aware of our treatment timeline.  I enjoyed meeting with her today and we look forward to continuing to participate in her care.  Given current concerns for patient exposure during the COVID-19 pandemic, this encounter was conducted via telephone. The patient was notified in advance and was offered a WebEX meeting to allow for face to face communication but unfortunately reported that he/she did not have the appropriate resources/technology to support such a visit and instead preferred to proceed with telephone consult. The patient has given verbal consent for this type of encounter. The attendants for this meeting include Hadiyah Maricle PA-C, and patient, Avelina Donley. During the encounter, Tequilla Cousineau PA-C, was located at [facility] Radiation Oncology Department.  Patient, Ricketta Shackelton, was located at home.   I personally spent 45 minutes in this encounter including chart review, reviewing radiological studies, telephone conversation with the patient, entering orders, coordinating care and completing documentation.   Marguarite Arbour, MMS, PA-C [facility] Radiation Oncology Physician Assistant Direct Dial: [phone]  Fax: [phone]

## 2023-08-08 NOTE — Progress Notes (Addendum)
Nursing interview for Reconsult pertaining to the RUL and spine (T1). Patient identity verified x2.  Patient reports moderate insomnia but is otherwise well. No related issues conveyed at this time.  Meaningful use complete.  Vitals- Ht 5\' 5"  (1.651 m)   Wt 164 lb (74.4 kg)   BMI 27.29 kg/m   This concludes the interaction.  Ruel Favors, LPN

## 2023-08-09 ENCOUNTER — Ambulatory Visit
Admission: RE | Admit: 2023-08-09 | Discharge: 2023-08-09 | Disposition: A | Discharge status: Home / Self Care | Payer: 59 | Source: Ambulatory Visit | Attending: Radiation Oncology | Admitting: Radiation Oncology

## 2023-08-09 ENCOUNTER — Ambulatory Visit: Payer: 59

## 2023-08-09 DIAGNOSIS — C7951 Secondary malignant neoplasm of bone: Secondary | ICD-10-CM

## 2023-08-09 DIAGNOSIS — Z510 Encounter for antineoplastic radiation therapy: Secondary | ICD-10-CM | POA: Diagnosis not present

## 2023-08-09 DIAGNOSIS — C3491 Malignant neoplasm of unspecified part of right bronchus or lung: Secondary | ICD-10-CM

## 2023-08-09 NOTE — Progress Notes (Signed)
Radiation Oncology         [phone] ________________________________  Name: [name] MRN: [id number]  Date: 08/09/2023  DOB: 1961/04/21  STEREOTACTIC BODY RADIOTHERAPY SIMULATION AND TREATMENT PLANNING NOTE    ICD-10-CM   1. Squamous cell lung cancer, right (HCC)  C34.91     2. 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   NARRATIVE:  The patient was brought to the CT Simulation planning suite.  Identity was confirmed.  All relevant records and images related to the planned course of therapy were reviewed.  The patient freely provided informed written consent to proceed with treatment after reviewing the details related to the planned course of therapy. The consent form was witnessed and verified by the simulation staff.  Then, the patient was set-up in a stable reproducible  supine position for radiation therapy.  A BodyFix immobilization pillow was fabricated for reproducible positioning.  Then I personally applied the abdominal compression paddle to limit respiratory excursion.  4D respiratoy motion management CT images were obtained.  Surface markings were placed.  The CT images were loaded into the planning software.  Then, using Cine, MIP, and standard views, the internal target volume (ITV) and planning target volumes (PTV) were delinieated, and avoidance structures were contoured.  Treatment planning then occurred.  The radiation prescription was entered and confirmed.  A total of two complex treatment devices were fabricated in the form of the BodyFix immobilization pillow and a neck accuform cushion.  I have requested : 3D Simulation  I have requested a DVH of the following structures: Heart, Lungs, Esophagus, Chest Wall, Brachial Plexus, Major Blood Vessels, and targets.  SPECIAL TREATMENT PROCEDURE:  The planned course  of therapy using radiation constitutes a special treatment procedure. Special care is required in the management of this patient for the following reasons. This treatment constitutes a Special Treatment Procedure for the following reason: [ High dose per fraction requiring special monitoring for increased toxicities of treatment including daily imaging..  The special nature of the planned course of radiotherapy will require increased physician supervision and oversight to ensure patient's safety with optimal treatment outcomes.  This requires extended time and effort.    RESPIRATORY MOTION MANAGEMENT SIMULATION:  In order to account for effect of respiratory motion on target structures and other organs in the planning and delivery of radiotherapy, this patient underwent respiratory motion management simulation.  To accomplish this, when the patient was brought to the CT simulation planning suite, 4D respiratoy motion management CT images were obtained.  The CT images were loaded into the planning software.  Then, using a variety of tools including Cine, MIP, and standard views, the target volume and planning target volumes (PTV) were delineated.  Avoidance structures were contoured.  Treatment planning then occurred.  Dose volume histograms were generated and reviewed for each of the requested structure.  The resulting plan was carefully reviewed and approved today.  PLAN:  The patient will receive 60 Gy in 8 fractions to the RUL lung mass and 50 Gy in 5 fractions to the oligometastasis at T1.  ________________________________  Artist Pais. [name], M.D.

## 2023-08-10 DIAGNOSIS — Z510 Encounter for antineoplastic radiation therapy: Secondary | ICD-10-CM | POA: Diagnosis not present

## 2023-08-13 ENCOUNTER — Other Ambulatory Visit: Payer: Self-pay

## 2023-08-13 ENCOUNTER — Ambulatory Visit
Admission: RE | Admit: 2023-08-13 | Discharge: 2023-08-13 | Disposition: A | Discharge status: Home / Self Care | Payer: 59 | Source: Ambulatory Visit | Attending: Radiation Oncology

## 2023-08-13 DIAGNOSIS — Z510 Encounter for antineoplastic radiation therapy: Secondary | ICD-10-CM | POA: Diagnosis not present

## 2023-08-13 LAB — RAD ONC ARIA SESSION SUMMARY
Course Elapsed Days: 0
Plan Fractions Treated to Date: 1
Plan Fractions Treated to Date: 1
Plan Prescribed Dose Per Fraction: 10 Gy
Plan Prescribed Dose Per Fraction: 7.5 Gy
Plan Total Fractions Prescribed: 5
Plan Total Fractions Prescribed: 8
Plan Total Prescribed Dose: 50 Gy
Plan Total Prescribed Dose: 60 Gy
Reference Point Dosage Given to Date: 10 Gy
Reference Point Dosage Given to Date: 7.5 Gy
Reference Point Session Dosage Given: 10 Gy
Reference Point Session Dosage Given: 7.5 Gy
Session Number: 1

## 2023-08-14 ENCOUNTER — Ambulatory Visit: Payer: 59

## 2023-08-14 ENCOUNTER — Other Ambulatory Visit: Payer: 59

## 2023-08-14 ENCOUNTER — Other Ambulatory Visit: Payer: Self-pay

## 2023-08-14 ENCOUNTER — Ambulatory Visit
Admission: RE | Admit: 2023-08-14 | Discharge: 2023-08-14 | Disposition: A | Discharge status: Home / Self Care | Payer: 59 | Source: Ambulatory Visit | Attending: Radiation Oncology | Admitting: Radiation Oncology

## 2023-08-14 DIAGNOSIS — C3490 Malignant neoplasm of unspecified part of unspecified bronchus or lung: Secondary | ICD-10-CM | POA: Insufficient documentation

## 2023-08-14 DIAGNOSIS — G936 Cerebral edema: Secondary | ICD-10-CM | POA: Diagnosis present

## 2023-08-14 DIAGNOSIS — C7931 Secondary malignant neoplasm of brain: Secondary | ICD-10-CM | POA: Diagnosis present

## 2023-08-14 LAB — RAD ONC ARIA SESSION SUMMARY
Course Elapsed Days: 1
Plan Fractions Treated to Date: 2
Plan Prescribed Dose Per Fraction: 7.5 Gy
Plan Total Fractions Prescribed: 8
Plan Total Prescribed Dose: 60 Gy
Reference Point Dosage Given to Date: 15 Gy
Reference Point Session Dosage Given: 7.5 Gy
Session Number: 2

## 2023-08-15 ENCOUNTER — Other Ambulatory Visit: Payer: Self-pay

## 2023-08-15 ENCOUNTER — Ambulatory Visit
Admission: RE | Admit: 2023-08-15 | Discharge: 2023-08-15 | Disposition: A | Discharge status: Home / Self Care | Payer: 59 | Source: Ambulatory Visit | Attending: Radiation Oncology | Admitting: Radiation Oncology

## 2023-08-15 DIAGNOSIS — G936 Cerebral edema: Secondary | ICD-10-CM | POA: Diagnosis not present

## 2023-08-15 LAB — RAD ONC ARIA SESSION SUMMARY
Course Elapsed Days: 2
Plan Fractions Treated to Date: 2
Plan Fractions Treated to Date: 3
Plan Prescribed Dose Per Fraction: 10 Gy
Plan Prescribed Dose Per Fraction: 7.5 Gy
Plan Total Fractions Prescribed: 5
Plan Total Fractions Prescribed: 8
Plan Total Prescribed Dose: 50 Gy
Plan Total Prescribed Dose: 60 Gy
Reference Point Dosage Given to Date: 20 Gy
Reference Point Dosage Given to Date: 22.5 Gy
Reference Point Session Dosage Given: 10 Gy
Reference Point Session Dosage Given: 7.5 Gy
Session Number: 3

## 2023-08-16 ENCOUNTER — Ambulatory Visit: Payer: 59

## 2023-08-17 ENCOUNTER — Ambulatory Visit
Admission: RE | Admit: 2023-08-17 | Discharge: 2023-08-17 | Disposition: A | Discharge status: Home / Self Care | Payer: 59 | Source: Ambulatory Visit | Attending: Radiation Oncology

## 2023-08-17 ENCOUNTER — Other Ambulatory Visit: Payer: Self-pay

## 2023-08-17 DIAGNOSIS — G936 Cerebral edema: Secondary | ICD-10-CM | POA: Diagnosis not present

## 2023-08-17 LAB — RAD ONC ARIA SESSION SUMMARY
Course Elapsed Days: 4
Plan Fractions Treated to Date: 3
Plan Fractions Treated to Date: 4
Plan Prescribed Dose Per Fraction: 10 Gy
Plan Prescribed Dose Per Fraction: 7.5 Gy
Plan Total Fractions Prescribed: 5
Plan Total Fractions Prescribed: 8
Plan Total Prescribed Dose: 50 Gy
Plan Total Prescribed Dose: 60 Gy
Reference Point Dosage Given to Date: 30 Gy
Reference Point Dosage Given to Date: 30 Gy
Reference Point Session Dosage Given: 10 Gy
Reference Point Session Dosage Given: 7.5 Gy
Session Number: 4

## 2023-08-20 ENCOUNTER — Ambulatory Visit: Payer: 59

## 2023-08-21 ENCOUNTER — Other Ambulatory Visit: Payer: 59

## 2023-08-21 ENCOUNTER — Ambulatory Visit: Payer: 59

## 2023-08-22 ENCOUNTER — Other Ambulatory Visit: Payer: Self-pay

## 2023-08-22 ENCOUNTER — Ambulatory Visit: Payer: 59

## 2023-08-22 ENCOUNTER — Ambulatory Visit: Admission: RE | Admit: 2023-08-22 | Payer: 59 | Source: Ambulatory Visit

## 2023-08-22 ENCOUNTER — Ambulatory Visit
Admission: RE | Admit: 2023-08-22 | Discharge: 2023-08-22 | Discharge status: Home / Self Care | Payer: 59 | Source: Ambulatory Visit | Attending: Radiation Oncology

## 2023-08-22 DIAGNOSIS — G936 Cerebral edema: Secondary | ICD-10-CM | POA: Diagnosis not present

## 2023-08-22 LAB — RAD ONC ARIA SESSION SUMMARY
Course Elapsed Days: 9
Plan Fractions Treated to Date: 4
Plan Fractions Treated to Date: 5
Plan Prescribed Dose Per Fraction: 10 Gy
Plan Prescribed Dose Per Fraction: 7.5 Gy
Plan Total Fractions Prescribed: 5
Plan Total Fractions Prescribed: 8
Plan Total Prescribed Dose: 50 Gy
Plan Total Prescribed Dose: 60 Gy
Reference Point Dosage Given to Date: 37.5 Gy
Reference Point Dosage Given to Date: 40 Gy
Reference Point Session Dosage Given: 10 Gy
Reference Point Session Dosage Given: 7.5 Gy
Session Number: 5

## 2023-08-22 NOTE — Progress Notes (Deleted)
[facility] OFFICE PROGRESS NOTE  [name]., FNP [address] [place] [address]  DIAGNOSIS: stage IV (T3, N0, M1C) non-small cell lung cancer, squamous cell carcinoma presented with right upper lobe lung mass in addition to solitary brain metastasis and T1 bone metastasis.   PDL1: ***  PRIOR THERAPY:  1) SRS to the metastatic brain lesion on 07/23/2023 under the care of Dr. [name]. 2) SBRT to the right upper lobe lung mass and SBRT to the solitary osseous metastasis at T1. ~Last dose on ***  CURRENT THERAPY: Systemic chemotherapy and immunotherapy with carboplatin for an AUC of 5, paclitaxel 175 mg/m, Keytruda 200 mg IV every 3 weeks.  INTERVAL HISTORY: [name] 62 y.o. female returns to the clinic today for a follow-up visit.  She was last seen in the clinic by Dr. Arbutus Ped on 07/26/2023.  She was establishing care in the clinic that day.  The plan is to start systemic palliative chemotherapy and immunotherapy on 08/27/23.  She is scheduled to complete SBRT to T1 and the right upper lobe lung mass today.  She had her chemo education class and does not have any questions.  She also had a Port-A-Cath placed.  She denies any major changes in her health since she was last seen.  She continues to have fatigue.  She reports continued dyspnea on exertion and cough but denies any chest pain or hemoptysis.  Denies any nausea, vomiting, diarrhea.  She does occasionally of constipation and she takes ***.  She denies any headache or visual changes.  Denies any rashes or skin changes.  She recently had a PET scan performed.  She is here today for evaluation repeat blood work before undergoing cycle #1.   MEDICAL HISTORY: Past Medical History:  Diagnosis Date   Acute on chronic respiratory failure (HCC) 10/09/2016   Allergic rhinitis    Breast cancer (HCC) 2016   right breast   COPD, mild (HCC) FOLLOWED BY DR UEAV   Depression    GERD (gastroesophageal reflux  disease)    HTN (hypertension)    Hypertension    Iron deficiency anemia    Long-term current use of steroids SYMBICORT INHALER   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    Overweight (BMI 25.0-29.9) 05/11/2016   Palpitations    Personal history of radiation therapy 2016   Sarcoidosis STABLE PER CXR JUNE 2013   Shortness of breath    Sickle cell trait (HCC)     ALLERGIES:  is allergic to codeine.  MEDICATIONS:  Current Outpatient Medications  Medication Sig Dispense Refill   acetaminophen (TYLENOL) 325 MG tablet Take 650 mg by mouth every 6 (six) hours as needed for mild pain or moderate pain.     albuterol (PROVENTIL) (2.5 MG/3ML) 0.083% nebulizer solution Take 3 mLs (2.5 mg total) by nebulization every 6 (six) hours as needed for wheezing or shortness of breath. 75 mL 1   albuterol (VENTOLIN HFA) 108 (90 Base) MCG/ACT inhaler INHALE 2 PUFFS EVERY 6 HOURS AS NEEDED FOR WHEEZING OR SHORTNESS OF BREATH (Patient taking differently: Inhale 2 puffs into the lungs every 6 (six) hours as needed for shortness of breath or wheezing.) 54 g 0   aspirin EC 81 MG tablet Take 1 tablet (81 mg total) by mouth daily. Start 02/23/19     atorvastatin (LIPITOR) 20 MG tablet TAKE 1 TABLET (20 MG TOTAL) BY MOUTH DAILY. 90 tablet 3   dexamethasone (DECADRON) 4 MG tablet One twice daily with food  until better then one daily 60 tablet 2   fluticasone (FLONASE) 50 MCG/ACT nasal spray USE 2 SPRAYS IN BOTH  NOSTRILS DAILY AS NEEDED  FOR ALLERGIES (Patient taking differently: Place 2 sprays into both nostrils daily as needed for allergies.) 48 g 11   hydroxychloroquine (PLAQUENIL) 200 MG tablet TAKE 1 TABLET BY MOUTH DAILY 100 tablet 2   lidocaine-prilocaine (EMLA) cream Apply to affected area once 30 g 3   metoprolol tartrate (LOPRESSOR) 25 MG tablet Take 1 tablet (25 mg total) by mouth daily.     montelukast (SINGULAIR) 10 MG tablet Take 10 mg by mouth at bedtime.     Multiple Vitamin (MULTIVITAMIN) capsule Take 1 capsule by mouth  daily.     omeprazole (PRILOSEC) 20 MG capsule TAKE 1 CAPSULE EVERY DAY (Patient taking differently: Take 20 mg by mouth daily.) 90 capsule 1   ondansetron (ZOFRAN) 4 MG tablet Take 1 tablet (4 mg total) by mouth every 6 (six) hours as needed for nausea. 20 tablet 0   OXYGEN 2lpm with rest and 3lpm with exertion     prochlorperazine (COMPAZINE) 10 MG tablet Take 1 tablet (10 mg total) by mouth every 6 (six) hours as needed for nausea or vomiting. 30 tablet 1   sertraline (ZOLOFT) 50 MG tablet TAKE 1 TABLET EVERY DAY (Patient taking differently: Take 50 mg by mouth daily.) 90 tablet 1   STIOLTO RESPIMAT 2.5-2.5 MCG/ACT AERS USE 2 INHALATIONS BY MOUTH DAILY 12 g 3   triamterene-hydrochlorothiazide (MAXZIDE-25) 37.5-25 MG tablet TAKE 1 TABLET EVERY DAY (Patient taking differently: Take 1 tablet by mouth daily. TAKE 1 TABLET EVERY DAY) 90 tablet 1   No current facility-administered medications for this visit.    SURGICAL HISTORY:  Past Surgical History:  Procedure Laterality Date   ADJUSTABLE SUTURE MANIPULATION  05/22/2012   Procedure: ADJUSTABLE SUTURE MANIPULATION;  Surgeon: Corinda Gubler, MD;  Location: [facility];  Service: Ophthalmology;  Laterality: Right;   BREAST LUMPECTOMY Right 2016   BRONCHIAL BIOPSY  07/09/2023   Procedure: BRONCHIAL BIOPSIES;  Surgeon: Leslye Peer, MD;  Location: [facility] ENDOSCOPY;  Service: Pulmonary;;   BRONCHIAL BRUSHINGS  07/09/2023   Procedure: BRONCHIAL BRUSHINGS;  Surgeon: Leslye Peer, MD;  Location: [facility] ENDOSCOPY;  Service: Pulmonary;;   BRONCHIAL NEEDLE ASPIRATION BIOPSY  07/09/2023   Procedure: BRONCHIAL NEEDLE ASPIRATION BIOPSIES;  Surgeon: Leslye Peer, MD;  Location: [facility] ENDOSCOPY;  Service: Pulmonary;;   CESAREAN SECTION  1986   W/ BILATERAL TUBAL LIGATION   COLONOSCOPY WITH PROPOFOL N/A 02/21/2019   Procedure: COLONOSCOPY WITH PROPOFOL;  Surgeon: Hilarie Fredrickson, MD;  Location: [facility] ENDOSCOPY;  Service: Endoscopy;  Laterality: N/A;    EYE SURGERY     IR IMAGING GUIDED PORT INSERTION  08/01/2023   MEDIAN RECTUS REPAIR  05/22/2012   Procedure: MEDIAN RECTUS REPAIR;  Surgeon: Corinda Gubler, MD;  [facility];  Service: Ophthalmology;  Laterality: Bilateral;  INFERIOR RECTUS RESECTION WITH ADJUSTIBLE SUTURES RIGHT EYE    POLYPECTOMY  02/21/2019   Procedure: POLYPECTOMY;  Surgeon: Hilarie Fredrickson, MD;  Location: [facility] ENDOSCOPY;  Service: Endoscopy;;   RADIOACTIVE SEED GUIDED PARTIAL MASTECTOMY WITH AXILLARY SENTINEL LYMPH NODE BIOPSY Right 08/18/2015   Procedure: RADIOACTIVE SEED GUIDED PARTIAL MASTECTOMY WITH AXILLARY SENTINEL LYMPH NODE BIOPSY;  Surgeon: Almond Lint, MD;  Location: [facility];  Service: General;  Laterality: Right;   TOTAL ROBOTIC ASSISTED LAPAROSCOPIC HYSTERECTOMY  12-30-2010   SYMPTOMATIC UTERINE FIBROIDS  UPPER TEETH EXTRACTION'S  1992   VIDEO BRONCHOSCOPY WITH RADIAL ENDOBRONCHIAL ULTRASOUND  07/09/2023   Procedure: VIDEO BRONCHOSCOPY WITH RADIAL ENDOBRONCHIAL ULTRASOUND;  Surgeon: Leslye Peer, MD;  Location: [facility] ENDOSCOPY;  Service: Pulmonary;;    REVIEW OF SYSTEMS:   Review of Systems  Constitutional: Negative for appetite change, chills, fatigue, fever and unexpected weight change.  HENT:   Negative for mouth sores, nosebleeds, sore throat and trouble swallowing.   Eyes: Negative for eye problems and icterus.  Respiratory: Negative for cough, hemoptysis, shortness of breath and wheezing.   Cardiovascular: Negative for chest pain and leg swelling.  Gastrointestinal: Negative for abdominal pain, constipation, diarrhea, nausea and vomiting.  Genitourinary: Negative for bladder incontinence, difficulty urinating, dysuria, frequency and hematuria.   Musculoskeletal: Negative for back pain, gait problem, neck pain and neck stiffness.  Skin: Negative for itching and rash.  Neurological: Negative for dizziness, extremity weakness, gait problem, headaches,  light-headedness and seizures.  Hematological: Negative for adenopathy. Does not bruise/bleed easily.  Psychiatric/Behavioral: Negative for confusion, depression and sleep disturbance. The patient is not nervous/anxious.     PHYSICAL EXAMINATION:  There were no vitals taken for this visit.  ECOG PERFORMANCE STATUS: {CHL ONC ECOG [id number]  Physical Exam  Constitutional: Oriented to person, place, and time and well-developed, well-nourished, and in no distress. No distress.  HENT:  Head: Normocephalic and atraumatic.  Mouth/Throat: Oropharynx is clear and moist. No oropharyngeal exudate.  Eyes: Conjunctivae are normal. Right eye exhibits no discharge. Left eye exhibits no discharge. No scleral icterus.  Neck: Normal range of motion. Neck supple.  Cardiovascular: Normal rate, regular rhythm, normal heart sounds and intact distal pulses.   Pulmonary/Chest: Effort normal and breath sounds normal. No respiratory distress. No wheezes. No rales.  Abdominal: Soft. Bowel sounds are normal. Exhibits no distension and no mass. There is no tenderness.  Musculoskeletal: Normal range of motion. Exhibits no edema.  Lymphadenopathy:    No cervical adenopathy.  Neurological: Alert and oriented to person, place, and time. Exhibits normal muscle tone. Gait normal. Coordination normal.  Skin: Skin is warm and dry. No rash noted. Not diaphoretic. No erythema. No pallor.  Psychiatric: Mood, memory and judgment normal.  Vitals reviewed.  LABORATORY DATA: Lab Results  Component Value Date   WBC 15.5 (H) 08/07/2023   HGB 9.5 (L) 08/07/2023   HCT 29.5 (L) 08/07/2023   MCV 78.7 (L) 08/07/2023   PLT 300 08/07/2023      Chemistry      Component Value Date/Time   NA 146 (H) 08/07/2023 0833   NA 146 (H) 01/10/2019 1220   K 3.7 08/07/2023 0833   CL 105 08/07/2023 0833   CO2 36 (H) 08/07/2023 0833   BUN 25 (H) 08/07/2023 0833   BUN 21 01/10/2019 1220   CREATININE 0.94 08/07/2023 0833    CREATININE 0.64 02/04/2015 1619      Component Value Date/Time   CALCIUM 9.1 08/07/2023 0833   ALKPHOS 69 08/07/2023 0833   AST 10 (L) 08/07/2023 0833   ALT 9 08/07/2023 0833   BILITOT 0.3 08/07/2023 0833       RADIOGRAPHIC STUDIES:  NM PET Image Initial (PI) Skull Base To Thigh  Result Date: 08/06/2023 CLINICAL DATA:  Initial treatment strategy for lung cancer. EXAM: NUCLEAR MEDICINE PET SKULL BASE TO THIGH TECHNIQUE: 8.14 mCi F-18 FDG was injected intravenously. Full-ring PET imaging was performed from the skull base to thigh after the radiotracer. CT data was obtained and used for attenuation correction and  anatomic localization. Fasting blood glucose: 88 mg/dl COMPARISON:  96/02/5408 FINDINGS: Mediastinal blood pool activity: SUV max 2.96 Liver activity: SUV max NA NECK: No hypermetabolic lymph nodes in the neck. Incidental CT findings: None. CHEST: Right upper lobe lung mass measures 6.1 x 4.7 cm with SUV max of 25.10, image 54/4. Central photopenia within this mass reflects internal necrosis. No additional tracer avid nodule or mass identified. No tracer avid mediastinal or hilar lymph nodes. The nodule within the posterior left lower lobe measuring 9 mm does not show any significant tracer activity, image 87/4. Incidental CT findings: Scattered areas of parenchymal scarring and architectural distortion identified bilaterally. Emphysema. Aortic atherosclerosis. ABDOMEN/PELVIS: No abnormal hypermetabolic activity within the liver, pancreas, adrenal glands, or spleen. No hypermetabolic lymph nodes in the abdomen or pelvis. Incidental CT findings: Aortic atherosclerotic calcifications. SKELETON: Tracer avid lesion involving the T1 vertebra has an SUV max of 8.62, image 44 the fused PET-CT images. No corresponding lytic or sclerotic changes identified on the CT images. Incidental CT findings: None. IMPRESSION: 1. Right upper lobe lung mass is intensely tracer avid and consistent with primary  bronchogenic carcinoma. 2. No tracer avid mediastinal or hilar lymph nodes. 3. Tracer avid lesion involving the T1 vertebra is consistent with osseous metastatic disease. 4. No additional sites of metastatic disease identified. 5. Aortic Atherosclerosis (ICD10-I70.0). Electronically Signed   By: Signa Kell M.D.   On: 08/06/2023 10:07   IR IMAGING GUIDED PORT INSERTION  Result Date: 08/01/2023 INDICATION: 62 year old female referred for port catheter EXAM: IMAGE GUIDED PORT CATHETER MEDICATIONS: None ANESTHESIA/SEDATION: Moderate (conscious) sedation was employed during this procedure. A total of Versed 1.0 mg and Fentanyl 50 mcg was administered intravenously. Moderate Sedation Time: 19 minutes. The patient's level of consciousness and vital signs were monitored continuously by radiology nursing throughout the procedure under my direct supervision. FLUOROSCOPY TIME:  CT COMPLICATIONS: None PROCEDURE: Informed written consent was obtained from the patient after a discussion of the risks, benefits, and alternatives to treatment. Questions regarding the procedure were encouraged and answered. The right neck and chest were prepped with chlorhexidine in a sterile fashion, and a sterile drape was applied covering the operative field. Maximum barrier sterile technique with sterile gowns and gloves were used for the procedure. A timeout was performed prior to the initiation of the procedure. Ultrasound survey was performed with images stored and sent to PACs. Right IJ vein documented to be patent. The right neck and chest was prepped with chlorhexidine, and draped in the usual sterile fashion using maximum barrier technique (cap and mask, sterile gown, sterile gloves, large sterile sheet, hand hygiene and cutaneous antiseptic). Local anesthesia was attained by infiltration with 1% lidocaine without epinephrine. Ultrasound demonstrated patency of the right internal jugular vein, and this was documented with an image.  Under real-time ultrasound guidance, this vein was accessed with a 21 gauge micropuncture needle and image documentation was performed. A small dermatotomy was made at the access site with an 11 scalpel. A 0.018" wire was advanced into the SVC and used to estimate the length of the internal catheter. The access needle exchanged for a 44F micropuncture vascular sheath. The 0.018" wire was then removed and a 0.035" wire advanced into the IVC. An appropriate location for the subcutaneous reservoir was selected below the clavicle and an incision was made through the skin and underlying soft tissues. The subcutaneous tissues were then dissected using a combination of blunt and sharp surgical technique and a pocket was formed. A single lumen  power injectable portacatheter was then tunneled through the subcutaneous tissues from the pocket to the dermatotomy and the port reservoir placed within the subcutaneous pocket. The venous access site was then serially dilated and a peel away vascular sheath placed over the wire. The wire was removed and the port catheter advanced into position under fluoroscopic guidance. The catheter tip is positioned in the cavoatrial junction. This was documented with a spot image. The portacatheter was then tested and found to flush and aspirate well. The port was flushed with saline followed by 100 units/mL heparinized saline. The pocket was then closed in two layers using first subdermal inverted interrupted absorbable sutures followed by a running subcuticular suture. The epidermis was then sealed with Dermabond. The dermatotomy at the venous access site was also seal with Dermabond. Patient tolerated the procedure well and remained hemodynamically stable throughout. No complications encountered and no significant blood loss encountered IMPRESSION: Status post right IJ port catheter placement Signed, Yvone Neu. Miachel Roux, RPVI [facility]  Radiology Electronically Signed   By: Gilmer Mor D.O.   On: 08/01/2023 12:29     ASSESSMENT/PLAN:  This is a very pleasant 62 year old African-American female with stage IV (T3, N0, M1 C) non-small cell lung cancer, squamous cell carcinoma.  She presented with a right upper lobe lung mass in addition to a solitary brain metastasis and a T1 bone lesion.  She was diagnosed in September 2024.  ***Molecular studies *** The patient underwent SRS to the solitary brain lesion and is scheduled to complete radiation to T1 in the right upper lobe lung mass today on 08/27/2023.  The patient was seen with Dr. Arbutus Ped today.  Dr. Arbutus Ped personally independently reviewed the patient's PET scan and reviewed the results with the patient today.  The scan showed known RUL lung mass and T1 lesion without any other sites of disease.   Dr. Arbutus Ped still is recommending palliative systemic chemotherapy and immunotherapy with carboplatin for an AUC of 5, paclitaxel 175 mg/m, and Keytruda 20 mg IV every 3 weeks with Neulasta support.  She is scheduled for her first cycle of treatment today.  Labs were reviewed.  Recommend that she ***with cycle #1 today scheduled.  We will see her back for follow-up visit in 1 week for evaluation and a 1 week follow-up visit to manage any adverse side effects of treatment.  The patient was advised to call immediately if she has any concerning symptoms in the interval. The patient voices understanding of current disease status and treatment options and is in agreement with the current care plan. All questions were answered. The patient knows to call the clinic with any problems, questions or concerns. We can certainly see the patient much sooner if necessary      No orders of the defined types were placed in this encounter.    I spent {CHL ONC TIME VISIT - UJWJX:[id number]} counseling the patient face to face. The total time spent in the appointment was {CHL ONC TIME VISIT -  ZHYQM:[id number]}.  Zeinab Rodwell L Tunis Gentle, PA-C 08/22/23

## 2023-08-23 ENCOUNTER — Ambulatory Visit: Payer: 59

## 2023-08-23 ENCOUNTER — Ambulatory Visit
Admission: RE | Admit: 2023-08-23 | Discharge: 2023-08-23 | Disposition: A | Discharge status: Home / Self Care | Payer: 59 | Source: Ambulatory Visit | Attending: Radiation Oncology | Admitting: Radiation Oncology

## 2023-08-23 ENCOUNTER — Ambulatory Visit: Payer: 59 | Admitting: Physician Assistant

## 2023-08-23 ENCOUNTER — Other Ambulatory Visit: Payer: Self-pay

## 2023-08-23 ENCOUNTER — Other Ambulatory Visit: Payer: 59

## 2023-08-23 DIAGNOSIS — G936 Cerebral edema: Secondary | ICD-10-CM | POA: Diagnosis not present

## 2023-08-23 LAB — RAD ONC ARIA SESSION SUMMARY
Course Elapsed Days: 10
Plan Fractions Treated to Date: 6
Plan Prescribed Dose Per Fraction: 7.5 Gy
Plan Total Fractions Prescribed: 8
Plan Total Prescribed Dose: 60 Gy
Reference Point Dosage Given to Date: 45 Gy
Reference Point Session Dosage Given: 7.5 Gy
Session Number: 6

## 2023-08-23 NOTE — Addendum Note (Signed)
Encounter addended by: Marcello Fennel, PA-C on: 08/23/2023 4:10 PM  Actions taken: Clinical Note Signed

## 2023-08-24 ENCOUNTER — Ambulatory Visit
Admission: RE | Admit: 2023-08-24 | Discharge: 2023-08-24 | Disposition: A | Discharge status: Home / Self Care | Payer: 59 | Source: Ambulatory Visit | Attending: Radiation Oncology | Admitting: Radiation Oncology

## 2023-08-24 ENCOUNTER — Other Ambulatory Visit: Payer: Self-pay

## 2023-08-24 ENCOUNTER — Telehealth: Payer: Self-pay

## 2023-08-24 DIAGNOSIS — C7931 Secondary malignant neoplasm of brain: Secondary | ICD-10-CM

## 2023-08-24 DIAGNOSIS — G936 Cerebral edema: Secondary | ICD-10-CM | POA: Diagnosis not present

## 2023-08-24 LAB — RAD ONC ARIA SESSION SUMMARY
Course Elapsed Days: 11
Plan Fractions Treated to Date: 5
Plan Fractions Treated to Date: 7
Plan Prescribed Dose Per Fraction: 10 Gy
Plan Prescribed Dose Per Fraction: 7.5 Gy
Plan Total Fractions Prescribed: 5
Plan Total Fractions Prescribed: 8
Plan Total Prescribed Dose: 50 Gy
Plan Total Prescribed Dose: 60 Gy
Reference Point Dosage Given to Date: 50 Gy
Reference Point Dosage Given to Date: 52.5 Gy
Reference Point Session Dosage Given: 10 Gy
Reference Point Session Dosage Given: 7.5 Gy
Session Number: 7

## 2023-08-24 MED FILL — Dexamethasone Sodium Phosphate Inj 100 MG/10ML: INTRAMUSCULAR | Qty: 1 | Status: AC

## 2023-08-24 MED FILL — Fosaprepitant Dimeglumine For IV Infusion 150 MG (Base Eq): INTRAVENOUS | Qty: 5 | Status: AC

## 2023-08-24 NOTE — Telephone Encounter (Signed)
Called and spoke to [place]. Discussed the MRI scheduled for 10/24/2023 and the telephone follow up scheduled for 10/26/23. She expressed understanding for both dates and states she will call if needed for changes.

## 2023-08-25 ENCOUNTER — Ambulatory Visit: Payer: 59

## 2023-08-27 ENCOUNTER — Inpatient Hospital Stay: Payer: 59 | Attending: Internal Medicine | Admitting: Physician Assistant

## 2023-08-27 ENCOUNTER — Encounter (HOSPITAL_COMMUNITY): Payer: Self-pay

## 2023-08-27 ENCOUNTER — Inpatient Hospital Stay: Payer: 59

## 2023-08-27 ENCOUNTER — Emergency Department (HOSPITAL_COMMUNITY): Payer: 59

## 2023-08-27 ENCOUNTER — Other Ambulatory Visit: Payer: Self-pay

## 2023-08-27 ENCOUNTER — Inpatient Hospital Stay (HOSPITAL_COMMUNITY)
Admission: EM | Admit: 2023-08-27 | Discharge: 2023-09-01 | DRG: 193 | Disposition: A | Discharge status: Home / Self Care | Payer: 59 | Attending: Family Medicine | Admitting: Family Medicine

## 2023-08-27 ENCOUNTER — Ambulatory Visit: Payer: 59

## 2023-08-27 DIAGNOSIS — E669 Obesity, unspecified: Secondary | ICD-10-CM | POA: Diagnosis present

## 2023-08-27 DIAGNOSIS — I10 Essential (primary) hypertension: Secondary | ICD-10-CM | POA: Diagnosis present

## 2023-08-27 DIAGNOSIS — J440 Chronic obstructive pulmonary disease with acute lower respiratory infection: Secondary | ICD-10-CM | POA: Diagnosis present

## 2023-08-27 DIAGNOSIS — E860 Dehydration: Secondary | ICD-10-CM | POA: Diagnosis present

## 2023-08-27 DIAGNOSIS — N179 Acute kidney failure, unspecified: Principal | ICD-10-CM | POA: Diagnosis present

## 2023-08-27 DIAGNOSIS — Z923 Personal history of irradiation: Secondary | ICD-10-CM

## 2023-08-27 DIAGNOSIS — C7951 Secondary malignant neoplasm of bone: Secondary | ICD-10-CM | POA: Insufficient documentation

## 2023-08-27 DIAGNOSIS — C3490 Malignant neoplasm of unspecified part of unspecified bronchus or lung: Secondary | ICD-10-CM | POA: Diagnosis present

## 2023-08-27 DIAGNOSIS — Z6827 Body mass index (BMI) 27.0-27.9, adult: Secondary | ICD-10-CM

## 2023-08-27 DIAGNOSIS — J189 Pneumonia, unspecified organism: Secondary | ICD-10-CM | POA: Diagnosis not present

## 2023-08-27 DIAGNOSIS — Z79899 Other long term (current) drug therapy: Secondary | ICD-10-CM | POA: Insufficient documentation

## 2023-08-27 DIAGNOSIS — D509 Iron deficiency anemia, unspecified: Secondary | ICD-10-CM | POA: Insufficient documentation

## 2023-08-27 DIAGNOSIS — R1310 Dysphagia, unspecified: Secondary | ICD-10-CM

## 2023-08-27 DIAGNOSIS — J9612 Chronic respiratory failure with hypercapnia: Secondary | ICD-10-CM | POA: Diagnosis present

## 2023-08-27 DIAGNOSIS — C7931 Secondary malignant neoplasm of brain: Secondary | ICD-10-CM | POA: Insufficient documentation

## 2023-08-27 DIAGNOSIS — G9341 Metabolic encephalopathy: Secondary | ICD-10-CM | POA: Diagnosis present

## 2023-08-27 DIAGNOSIS — F32A Depression, unspecified: Secondary | ICD-10-CM | POA: Diagnosis present

## 2023-08-27 DIAGNOSIS — Z7952 Long term (current) use of systemic steroids: Secondary | ICD-10-CM | POA: Insufficient documentation

## 2023-08-27 DIAGNOSIS — G936 Cerebral edema: Secondary | ICD-10-CM | POA: Diagnosis present

## 2023-08-27 DIAGNOSIS — D869 Sarcoidosis, unspecified: Secondary | ICD-10-CM | POA: Diagnosis present

## 2023-08-27 DIAGNOSIS — K219 Gastro-esophageal reflux disease without esophagitis: Secondary | ICD-10-CM | POA: Diagnosis present

## 2023-08-27 DIAGNOSIS — Z885 Allergy status to narcotic agent status: Secondary | ICD-10-CM

## 2023-08-27 DIAGNOSIS — R627 Adult failure to thrive: Secondary | ICD-10-CM | POA: Diagnosis present

## 2023-08-27 DIAGNOSIS — C3491 Malignant neoplasm of unspecified part of right bronchus or lung: Secondary | ICD-10-CM | POA: Diagnosis present

## 2023-08-27 DIAGNOSIS — D630 Anemia in neoplastic disease: Secondary | ICD-10-CM | POA: Diagnosis present

## 2023-08-27 DIAGNOSIS — J309 Allergic rhinitis, unspecified: Secondary | ICD-10-CM | POA: Diagnosis present

## 2023-08-27 DIAGNOSIS — C3411 Malignant neoplasm of upper lobe, right bronchus or lung: Secondary | ICD-10-CM | POA: Diagnosis present

## 2023-08-27 DIAGNOSIS — Z9981 Dependence on supplemental oxygen: Secondary | ICD-10-CM

## 2023-08-27 DIAGNOSIS — E875 Hyperkalemia: Secondary | ICD-10-CM | POA: Diagnosis present

## 2023-08-27 DIAGNOSIS — Z7982 Long term (current) use of aspirin: Secondary | ICD-10-CM

## 2023-08-27 DIAGNOSIS — Z90710 Acquired absence of both cervix and uterus: Secondary | ICD-10-CM

## 2023-08-27 DIAGNOSIS — D72829 Elevated white blood cell count, unspecified: Secondary | ICD-10-CM

## 2023-08-27 DIAGNOSIS — R410 Disorientation, unspecified: Secondary | ICD-10-CM

## 2023-08-27 DIAGNOSIS — Z853 Personal history of malignant neoplasm of breast: Secondary | ICD-10-CM

## 2023-08-27 DIAGNOSIS — Z66 Do not resuscitate: Secondary | ICD-10-CM | POA: Diagnosis present

## 2023-08-27 DIAGNOSIS — Z8249 Family history of ischemic heart disease and other diseases of the circulatory system: Secondary | ICD-10-CM

## 2023-08-27 DIAGNOSIS — D573 Sickle-cell trait: Secondary | ICD-10-CM | POA: Diagnosis present

## 2023-08-27 DIAGNOSIS — Z87891 Personal history of nicotine dependence: Secondary | ICD-10-CM

## 2023-08-27 DIAGNOSIS — C7940 Secondary malignant neoplasm of unspecified part of nervous system: Secondary | ICD-10-CM

## 2023-08-27 DIAGNOSIS — J9611 Chronic respiratory failure with hypoxia: Secondary | ICD-10-CM | POA: Diagnosis present

## 2023-08-27 DIAGNOSIS — K1230 Oral mucositis (ulcerative), unspecified: Secondary | ICD-10-CM | POA: Diagnosis present

## 2023-08-27 DIAGNOSIS — J449 Chronic obstructive pulmonary disease, unspecified: Secondary | ICD-10-CM | POA: Diagnosis present

## 2023-08-27 DIAGNOSIS — Z1152 Encounter for screening for COVID-19: Secondary | ICD-10-CM

## 2023-08-27 LAB — CBC WITH DIFFERENTIAL/PLATELET
Abs Immature Granulocytes: 0.16 10*3/uL — ABNORMAL HIGH (ref 0.00–0.07)
Basophils Absolute: 0 10*3/uL (ref 0.0–0.1)
Basophils Relative: 0 %
Eosinophils Absolute: 0.2 10*3/uL (ref 0.0–0.5)
Eosinophils Relative: 2 %
HCT: 31.6 % — ABNORMAL LOW (ref 36.0–46.0)
Hemoglobin: 9.6 g/dL — ABNORMAL LOW (ref 12.0–15.0)
Immature Granulocytes: 1 %
Lymphocytes Relative: 3 %
Lymphs Abs: 0.4 10*3/uL — ABNORMAL LOW (ref 0.7–4.0)
MCH: 24.3 pg — ABNORMAL LOW (ref 26.0–34.0)
MCHC: 30.4 g/dL (ref 30.0–36.0)
MCV: 80 fL (ref 80.0–100.0)
Monocytes Absolute: 0.8 10*3/uL (ref 0.1–1.0)
Monocytes Relative: 6 %
Neutro Abs: 12.9 10*3/uL — ABNORMAL HIGH (ref 1.7–7.7)
Neutrophils Relative %: 88 %
Platelets: 489 10*3/uL — ABNORMAL HIGH (ref 150–400)
RBC: 3.95 MIL/uL (ref 3.87–5.11)
RDW: 16.2 % — ABNORMAL HIGH (ref 11.5–15.5)
WBC: 14.6 10*3/uL — ABNORMAL HIGH (ref 4.0–10.5)
nRBC: 0 % (ref 0.0–0.2)

## 2023-08-27 LAB — COMPREHENSIVE METABOLIC PANEL
ALT: 13 U/L (ref 0–44)
AST: 13 U/L — ABNORMAL LOW (ref 15–41)
Albumin: 2.9 g/dL — ABNORMAL LOW (ref 3.5–5.0)
Alkaline Phosphatase: 89 U/L (ref 38–126)
Anion gap: 17 — ABNORMAL HIGH (ref 5–15)
BUN: 29 mg/dL — ABNORMAL HIGH (ref 8–23)
CO2: 29 mmol/L (ref 22–32)
Calcium: 9.6 mg/dL (ref 8.9–10.3)
Chloride: 91 mmol/L — ABNORMAL LOW (ref 98–111)
Creatinine, Ser: 1.36 mg/dL — ABNORMAL HIGH (ref 0.44–1.00)
GFR, Estimated: 44 mL/min — ABNORMAL LOW (ref >60)
Glucose, Bld: 80 mg/dL (ref 70–99)
Potassium: 4.5 mmol/L (ref 3.5–5.1)
Sodium: 137 mmol/L (ref 135–145)
Total Bilirubin: 0.8 mg/dL (ref 0.3–1.2)
Total Protein: 7.7 g/dL (ref 6.5–8.1)

## 2023-08-27 LAB — URINALYSIS, ROUTINE W REFLEX MICROSCOPIC
Bilirubin Urine: NEGATIVE
Glucose, UA: NEGATIVE mg/dL
Ketones, ur: NEGATIVE mg/dL
Nitrite: NEGATIVE
Protein, ur: NEGATIVE mg/dL
Specific Gravity, Urine: 1.012 (ref 1.005–1.030)
pH: 5 (ref 5.0–8.0)

## 2023-08-27 LAB — RESP PANEL BY RT-PCR (RSV, FLU A&B, COVID)  RVPGX2
Influenza A by PCR: NEGATIVE
Influenza B by PCR: NEGATIVE
Resp Syncytial Virus by PCR: NEGATIVE
SARS Coronavirus 2 by RT PCR: NEGATIVE

## 2023-08-27 LAB — I-STAT CG4 LACTIC ACID, ED
Lactic Acid, Venous: 1.1 mmol/L (ref 0.5–1.9)
Lactic Acid, Venous: 1.2 mmol/L (ref 0.5–1.9)

## 2023-08-27 LAB — TROPONIN I (HIGH SENSITIVITY)
Troponin I (High Sensitivity): 7 ng/L (ref <18)
Troponin I (High Sensitivity): 8 ng/L (ref <18)

## 2023-08-27 LAB — GROUP A STREP BY PCR: Group A Strep by PCR: NOT DETECTED

## 2023-08-27 LAB — CBG MONITORING, ED: Glucose-Capillary: 76 mg/dL (ref 70–99)

## 2023-08-27 MED ORDER — PANTOPRAZOLE SODIUM 40 MG PO TBEC
40.0000 mg | DELAYED_RELEASE_TABLET | Freq: Every day | ORAL | Status: DC
  Administered 2023-08-28 – 2023-09-01 (×5): 40 mg via ORAL
  Filled 2023-08-27 (×5): qty 1

## 2023-08-27 MED ORDER — ACETAMINOPHEN 650 MG RE SUPP: 650.0000 mg | Freq: Four times a day (QID) | RECTAL | Status: DC | PRN

## 2023-08-27 MED ORDER — METOPROLOL TARTRATE 25 MG PO TABS
25.0000 mg | ORAL_TABLET | Freq: Every day | ORAL | Status: DC
  Administered 2023-08-28 – 2023-09-01 (×5): 25 mg via ORAL
  Filled 2023-08-27 (×5): qty 1

## 2023-08-27 MED ORDER — ONDANSETRON HCL 4 MG PO TABS: 4.0000 mg | ORAL_TABLET | Freq: Four times a day (QID) | ORAL | Status: DC | PRN

## 2023-08-27 MED ORDER — ENOXAPARIN SODIUM 40 MG/0.4ML IJ SOSY
40.0000 mg | PREFILLED_SYRINGE | INTRAMUSCULAR | Status: DC
  Administered 2023-08-27 – 2023-08-31 (×5): 40 mg via SUBCUTANEOUS
  Filled 2023-08-27 (×5): qty 0.4

## 2023-08-27 MED ORDER — SERTRALINE HCL 50 MG PO TABS
50.0000 mg | ORAL_TABLET | Freq: Every day | ORAL | Status: DC
  Administered 2023-08-28 – 2023-09-01 (×5): 50 mg via ORAL
  Filled 2023-08-27 (×5): qty 1

## 2023-08-27 MED ORDER — MONTELUKAST SODIUM 10 MG PO TABS
10.0000 mg | ORAL_TABLET | Freq: Every day | ORAL | Status: DC
  Administered 2023-08-27 – 2023-08-31 (×5): 10 mg via ORAL
  Filled 2023-08-27 (×5): qty 1

## 2023-08-27 MED ORDER — SODIUM CHLORIDE 0.9 % IV SOLN: INTRAVENOUS | Status: DC

## 2023-08-27 MED ORDER — TRAZODONE HCL 50 MG PO TABS
25.0000 mg | ORAL_TABLET | Freq: Every evening | ORAL | Status: DC | PRN
  Administered 2023-08-30: 25 mg via ORAL
  Filled 2023-08-27: qty 1

## 2023-08-27 MED ORDER — ALBUTEROL SULFATE (2.5 MG/3ML) 0.083% IN NEBU: 2.5000 mg | INHALATION_SOLUTION | RESPIRATORY_TRACT | Status: DC | PRN

## 2023-08-27 MED ORDER — ONDANSETRON HCL 4 MG/2ML IJ SOLN: 4.0000 mg | Freq: Four times a day (QID) | INTRAMUSCULAR | Status: DC | PRN

## 2023-08-27 MED ORDER — PHENOL 1.4 % MT LIQD
1.0000 | OROMUCOSAL | Status: DC | PRN
  Administered 2023-08-27: 1 via OROMUCOSAL
  Filled 2023-08-27: qty 177

## 2023-08-27 MED ORDER — SODIUM CHLORIDE 0.9 % IV SOLN: INTRAVENOUS | Status: AC

## 2023-08-27 MED ORDER — HYDROXYCHLOROQUINE SULFATE 200 MG PO TABS
200.0000 mg | ORAL_TABLET | Freq: Every day | ORAL | Status: DC
  Administered 2023-08-28 – 2023-09-01 (×5): 200 mg via ORAL
  Filled 2023-08-27 (×6): qty 1

## 2023-08-27 MED ORDER — SODIUM CHLORIDE 0.9 % IV BOLUS
1000.0000 mL | Freq: Once | INTRAVENOUS | Status: AC
  Administered 2023-08-27: 1000 mL via INTRAVENOUS

## 2023-08-27 MED ORDER — ACETAMINOPHEN 325 MG PO TABS
650.0000 mg | ORAL_TABLET | Freq: Four times a day (QID) | ORAL | Status: DC | PRN
  Administered 2023-08-28: 650 mg via ORAL
  Filled 2023-08-27: qty 2

## 2023-08-27 NOTE — ED Provider Notes (Signed)
[place]  EMERGENCY DEPARTMENT AT [facility] Provider Note   CSN: [id number] Arrival date & time: 08/27/23  1112     History  No chief complaint on file.   [name] is a 62 y.o. female.  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  She has a new lung mass and brain mass, currently undergoing radiation.  Remote history of breast cancer.  She said for the last 2 days she has had no appetite and feeling generally weak.  She denies any headache numbness focal weakness chest pain shortness of breath abdominal pain vomiting diarrhea or urinary symptoms.  No recent falls no fevers or chills.  She is on chronic oxygen.  The history is provided by the patient and the EMS personnel.  Weakness Severity:  Severe Onset quality:  Gradual Duration:  2 days Timing:  Constant Progression:  Unchanged Chronicity:  New Relieved by:  Nothing Worsened by:  Activity Ineffective treatments:  Rest Associated symptoms: no abdominal pain, no chest pain, no cough, no diarrhea, no dysuria, no falls, no fever, no foul-smelling urine, no headaches, no nausea, no shortness of breath and no vomiting        Home Medications Prior to Admission medications   Medication Sig Start Date End Date Taking? Authorizing Provider  acetaminophen (TYLENOL) 325 MG tablet Take 650 mg by mouth every 6 (six) hours as needed for mild pain or moderate pain.    [provider]  albuterol (PROVENTIL) (2.5 MG/3ML) 0.083% nebulizer solution Take 3 mLs (2.5 mg total) by nebulization every 6 (six) hours as needed for wheezing or shortness of breath. 12/22/18   Osvaldo Shipper, MD  albuterol (VENTOLIN HFA) 108 (90 Base) MCG/ACT inhaler INHALE 2 PUFFS EVERY 6 HOURS AS NEEDED FOR WHEEZING OR SHORTNESS OF BREATH Patient taking differently: Inhale 2 puffs into the lungs every 6 (six) hours as needed for shortness of breath or wheezing. 09/23/19   Nyoka Cowden, MD  aspirin EC 81 MG tablet Take 1 tablet (81 mg  total) by mouth daily. Start 02/23/19 02/21/19   Pokhrel, Rebekah Chesterfield, MD  atorvastatin (LIPITOR) 20 MG tablet TAKE 1 TABLET (20 MG TOTAL) BY MOUTH DAILY. 03/28/21   Sandre Kitty, MD  dexamethasone (DECADRON) 4 MG tablet One twice daily with food until better then one daily 07/17/23   Nyoka Cowden, MD  fluticasone (FLONASE) 50 MCG/ACT nasal spray USE 2 SPRAYS IN BOTH  NOSTRILS DAILY AS NEEDED  FOR ALLERGIES Patient taking differently: Place 2 sprays into both nostrils daily as needed for allergies. 08/15/21   Nyoka Cowden, MD  hydroxychloroquine (PLAQUENIL) 200 MG tablet TAKE 1 TABLET BY MOUTH DAILY 03/19/23   Nyoka Cowden, MD  lidocaine-prilocaine (EMLA) cream Apply to affected area once 08/01/23   Si Gaul, MD  metoprolol tartrate (LOPRESSOR) 25 MG tablet Take 1 tablet (25 mg total) by mouth daily. 07/09/23   Leslye Peer, MD  montelukast (SINGULAIR) 10 MG tablet Take 10 mg by mouth at bedtime. 07/31/22   [provider]  Multiple Vitamin (MULTIVITAMIN) capsule Take 1 capsule by mouth daily.    [provider]  omeprazole (PRILOSEC) 20 MG capsule TAKE 1 CAPSULE EVERY DAY Patient taking differently: Take 20 mg by mouth daily. 01/03/21   Sandre Kitty, MD  ondansetron (ZOFRAN) 4 MG tablet Take 1 tablet (4 mg total) by mouth every 6 (six) hours as needed for nausea. 06/28/23   Alwyn Ren, MD  OXYGEN 2lpm with  rest and 3lpm with exertion    [provider]  prochlorperazine (COMPAZINE) 10 MG tablet Take 1 tablet (10 mg total) by mouth every 6 (six) hours as needed for nausea or vomiting. 08/01/23   Si Gaul, MD  sertraline (ZOLOFT) 50 MG tablet TAKE 1 TABLET EVERY DAY Patient taking differently: Take 50 mg by mouth daily. 09/11/20   Sandre Kitty, MD  STIOLTO RESPIMAT 2.5-2.5 MCG/ACT AERS USE 2 INHALATIONS BY MOUTH DAILY 11/02/22   Nyoka Cowden, MD  triamterene-hydrochlorothiazide (MAXZIDE-25) 37.5-25 MG tablet TAKE 1 TABLET EVERY DAY Patient  taking differently: Take 1 tablet by mouth daily. TAKE 1 TABLET EVERY DAY 06/02/21   Nyoka Cowden, MD      Allergies    Codeine    Review of Systems   Review of Systems  Constitutional:  Positive for appetite change and fatigue. Negative for fever.  Eyes:  Negative for visual disturbance.  Respiratory:  Negative for cough and shortness of breath.   Cardiovascular:  Negative for chest pain.  Gastrointestinal:  Negative for abdominal pain, diarrhea, nausea and vomiting.  Genitourinary:  Negative for dysuria.  Musculoskeletal:  Negative for falls.  Neurological:  Positive for weakness. Negative for headaches.    Physical Exam Updated Vital Signs BP 102/71 (BP Location: Left Arm)   Pulse (!) 112   Temp 98.3 F (36.8 C) (Oral)   Resp 13   SpO2 97%  Physical Exam Vitals and nursing note reviewed.  Constitutional:      General: She is not in acute distress.    Appearance: Normal appearance. She is well-developed.  HENT:     Head: Normocephalic and atraumatic.  Eyes:     Conjunctiva/sclera: Conjunctivae normal.  Cardiovascular:     Rate and Rhythm: Regular rhythm. Tachycardia present.     Heart sounds: No murmur heard. Pulmonary:     Effort: Pulmonary effort is normal. No respiratory distress.     Breath sounds: Normal breath sounds.  Abdominal:     Palpations: Abdomen is soft.     Tenderness: There is no abdominal tenderness. There is no guarding or rebound.  Musculoskeletal:        General: No deformity. Normal range of motion.     Cervical back: Neck supple.  Skin:    General: Skin is warm and dry.     Capillary Refill: Capillary refill takes less than 2 seconds.  Neurological:     General: No focal deficit present.     Mental Status: She is alert.     Sensory: No sensory deficit.     Motor: No weakness.     ED Results / Procedures / Treatments   Labs (all labs ordered are listed, but only abnormal results are displayed) Labs Reviewed  COMPREHENSIVE  METABOLIC PANEL - Abnormal; Notable for the following components:      Result Value   Chloride 91 (*)    BUN 29 (*)    Creatinine, Ser 1.36 (*)    Albumin 2.9 (*)    AST 13 (*)    GFR, Estimated 44 (*)    Anion gap 17 (*)    All other components within normal limits  CBC WITH DIFFERENTIAL/PLATELET - Abnormal; Notable for the following components:   WBC 14.6 (*)    Hemoglobin 9.6 (*)    HCT 31.6 (*)    MCH 24.3 (*)    RDW 16.2 (*)    Platelets 489 (*)    Neutro Abs 12.9 (*)  Lymphs Abs 0.4 (*)    Abs Immature Granulocytes 0.16 (*)    All other components within normal limits  URINALYSIS, ROUTINE W REFLEX MICROSCOPIC - Abnormal; Notable for the following components:   Hgb urine dipstick SMALL (*)    Leukocytes,Ua MODERATE (*)    Bacteria, UA RARE (*)    All other components within normal limits  RESP PANEL BY RT-PCR (RSV, FLU A&B, COVID)  [id number]  GROUP A STREP BY PCR  BASIC METABOLIC PANEL  CBC  I-STAT CG4 LACTIC ACID, ED  CBG MONITORING, ED  I-STAT CG4 LACTIC ACID, ED  TROPONIN I (HIGH SENSITIVITY)  TROPONIN I (HIGH SENSITIVITY)    EKG EKG Interpretation Date/Time:  Monday August 27 2023 11:47:54 EDT Ventricular Rate:  111 PR Interval:  157 QRS Duration:  94 QT Interval:  371 QTC Calculation: 505 R Axis:   49  Text Interpretation: Sinus tachycardia Multiple premature complexes, vent & supraven LAE, consider biatrial enlargement Abnormal R-wave progression, early transition ST elevation, consider inferior injury Prolonged QT interval rate is faster than prior 8/24 Confirmed by Meridee Score [phone]) on 08/27/2023 11:51:14 AM  Radiology CT Head Wo Contrast  Result Date: 08/27/2023 CLINICAL DATA:  Mental status change, unknown cause EXAM: CT HEAD WITHOUT CONTRAST TECHNIQUE: Contiguous axial images were obtained from the base of the skull through the vertex without intravenous contrast. RADIATION DOSE REDUCTION: This exam was performed according to the departmental  dose-optimization program which includes automated exposure control, adjustment of the mA and/or kV according to patient size and/or use of iterative reconstruction technique. COMPARISON:  MRI 07/20/2023. FINDINGS: Brain: Hypodensity in the left occipital region appears new. Similar edema in the right frontoparietal region. No substantial mass effect. No midline shift. No hydrocephalus. No acute hemorrhage. Vascular: No hyperdense vessel. Skull: No acute fracture. Sinuses/Orbits: Clear sinuses.  No acute orbital findings. Other: No mastoid effusions. IMPRESSION: 1. Hypodensity in the left occipital region could represent interval (since 07/20/2023) infarct or metastasis. Recommend MRI head with contrast to further evaluate. 2. Grossly similar edema in the right frontoparietal region correlates with known metastatic lesion seen on prior MRI. No progressive mass effect. Electronically Signed   By: Feliberto Harts M.D.   On: 08/27/2023 17:12   DG Chest Port 1 View  Result Date: 08/27/2023 CLINICAL DATA:  Weakness.  History of lung cancer EXAM: PORTABLE CHEST 1 VIEW COMPARISON:  X-ray 07/09/2023.  PET-CT scan 08/03/2023 FINDINGS: Volume loss of the right hemithorax with slightly increasing opacity in the right upper lung compared to prior x-ray. Surgical clips in the right axillary region. Left lung is grossly clear. No pneumothorax, effusion or edema. Normal cardiopericardial silhouette. Interval placement of a right IJ chest port with the tip along the upper SVC. Overlapping cardiac leads. IMPRESSION: Chest port. Increasing parenchymal opacity in the right upper lung. Acute infiltrative process is possible. Please correlate for known history of a right upper lobe lung cancer. Electronically Signed   By: Karen Kays M.D.   On: 08/27/2023 14:33    Procedures Procedures    Medications Ordered in ED Medications  0.9 %  sodium chloride infusion (has no administration in time range)  hydroxychloroquine  (PLAQUENIL) tablet 200 mg (has no administration in time range)  metoprolol tartrate (LOPRESSOR) tablet 25 mg (has no administration in time range)  sertraline (ZOLOFT) tablet 50 mg (has no administration in time range)  pantoprazole (PROTONIX) EC tablet 40 mg (has no administration in time range)  montelukast (SINGULAIR) tablet 10 mg (has no  administration in time range)  enoxaparin (LOVENOX) injection 40 mg (has no administration in time range)  acetaminophen (TYLENOL) tablet 650 mg (has no administration in time range)    Or  acetaminophen (TYLENOL) suppository 650 mg (has no administration in time range)  traZODone (DESYREL) tablet 25 mg (has no administration in time range)  ondansetron (ZOFRAN) tablet 4 mg (has no administration in time range)    Or  ondansetron (ZOFRAN) injection 4 mg (has no administration in time range)  albuterol (PROVENTIL) (2.5 MG/3ML) 0.083% nebulizer solution 2.5 mg (has no administration in time range)  sodium chloride 0.9 % bolus 1,000 mL (1,000 mLs Intravenous New Bag/Given 08/27/23 1205)    ED Course/ Medical Decision Making/ A&P Clinical Course as of 08/27/23 1736  Mon Aug 27, 2023  1155 Patient has a new port on her right side, worsening right upper lobe mass similar to prior August 2024 [MB]  1247 Nursing had talked to patient's sister.  Has been steadily declining although much worse over the last few days.  Poor p.o. intake.  Weight loss episodes of confusion. [MB]  1633 Discussed with [facility] hospitalist who will evaluate patient for admission. [MB]    Clinical Course User Index [MB] Terrilee Files, MD                                 Medical Decision Making Amount and/or Complexity of Data Reviewed Labs: ordered. Radiology: ordered.  Risk Decision regarding hospitalization.   This patient complains of general weakness poor appetite slowed cognition; this involves an extensive number of treatment Options and is a complaint that carries  with it a high risk of complications and morbidity. The differential includes stroke, bleed, worsening metastases, dehydration, metabolic derangement, infection  I ordered, reviewed and interpreted labs, which included CBC with elevated white count low stable hemoglobin, chemistry with worsening renal function, urinalysis without signs of infection, COVID and flu negative, strep negative I ordered medication IV fluids and reviewed PMP when indicated. I ordered imaging studies which included chest x-ray and head CT and I independently    visualized and interpreted imaging which showed possible new metastasis versus stroke Previous records obtained and reviewed in epic including ED and oncology notes I consulted [facility] hospitalist Dr. Erenest Blank and discussed lab and imaging findings and discussed disposition.  Cardiac monitoring reviewed, sinus tachycardia Social determinants considered, no significant barriers Critical Interventions: None  After the interventions stated above, I reevaluated the patient and found patient to be nontoxic but still very slow in cognition Admission and further testing considered, she would benefit from admission to the hospital for further workup.  She is in agreement with plan.         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

## 2023-08-27 NOTE — ED Triage Notes (Signed)
The pt was [facility]. The pt came from home. The pts sister called and reports that pt has been FTT x2 days. Refuses everything PO. 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 The sister reports pt not knowing what year it is, but knows who she is and where she is. Some days she knows and some days she doesn't. Today she is A&Ox3. She has long excessive weight in the last month since diagnoses. She is on radiation which last tx was 08/24/23. The pt usually has a sore throat at the end of tx and the family thinks this contributes to poor intake the last couple of days. VS B/P 130/76, O2 Sats 97% O2@2L /Bent, R 16, CBG 88, T 97.3, P 82.

## 2023-08-27 NOTE — H&P (Signed)
History and Physical  [name] VFI:[id number] DOB: 11-12-1961 DOA: 08/27/2023  PCP: [name]., FNP   Chief Complaint: Weakness  HPI: [name] is a 62 y.o. 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  Patient is awake alert and oriented, per ER documentation, family has been concerned that the patient has not been eating or drinking anything for the last couple of days, and seems to have started getting some memory issues since starting radiation.  Patient says that she is intermittently slightly confused.  However, she knows where she is, and is a good historian.  She denies any fevers, chills, nausea, vomiting, cough, or worsening shortness of breath.  She last saw her oncologist Dr. Arbutus Ped 9/12, since that time has had a right anterior chest port placed but is not sure when her next appointment with oncology is.  She has been compliant with brain radiation, last on 10/11.  ED Course: On evaluation in the emergency department, she is afebrile and tachycardic.  Blood pressure 102/71.  Saturating 97% on her baseline oxygen.  Lab work reviewed, significant for WBC 15, hemoglobin 9.6, creatinine up to 1.36 last was 0.97 on 08/07/2023.  He was given 1 L normal saline bolus in the ER.  CT scan of the head was completed, radiologist interpretation is pending.  Review of Systems: Please see HPI for pertinent positives and negatives. A complete 10 system review of systems are otherwise negative.  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 ENDOSCOPY;  Service: Pulmonary;;   BRONCHIAL BRUSHINGS  07/09/2023   Procedure: BRONCHIAL BRUSHINGS;  Surgeon: Leslye Peer, MD;  Location: [facility] ENDOSCOPY;  Service: Pulmonary;;   BRONCHIAL NEEDLE ASPIRATION BIOPSY  07/09/2023   Procedure: BRONCHIAL NEEDLE ASPIRATION BIOPSIES;  Surgeon: Leslye Peer, MD;  Location: [facility] ENDOSCOPY;  Service: Pulmonary;;   CESAREAN SECTION  1986   W/ BILATERAL TUBAL LIGATION   COLONOSCOPY WITH PROPOFOL N/A 02/21/2019   Procedure: COLONOSCOPY WITH PROPOFOL;  Surgeon: Hilarie Fredrickson, MD;  Location: [facility] ENDOSCOPY;  Service: Endoscopy;  Laterality: N/A;   EYE SURGERY     IR IMAGING GUIDED PORT INSERTION  08/01/2023   MEDIAN RECTUS REPAIR  05/22/2012  Procedure: MEDIAN RECTUS REPAIR;  Surgeon: Corinda Gubler, MD;  Location: [facility];  Service: Ophthalmology;  Laterality: Bilateral;  INFERIOR RECTUS RESECTION WITH ADJUSTIBLE SUTURES RIGHT EYE    POLYPECTOMY  02/21/2019   Procedure: POLYPECTOMY;  Surgeon: Hilarie Fredrickson, MD;  Location: [facility] ENDOSCOPY;  Service: Endoscopy;;   RADIOACTIVE SEED GUIDED PARTIAL MASTECTOMY WITH AXILLARY SENTINEL LYMPH NODE  BIOPSY Right 08/18/2015   Procedure: RADIOACTIVE SEED GUIDED PARTIAL MASTECTOMY WITH AXILLARY SENTINEL LYMPH NODE BIOPSY;  Surgeon: Almond Lint, MD;  Location: [facility];  Service: General;  Laterality: Right;   TOTAL ROBOTIC ASSISTED LAPAROSCOPIC HYSTERECTOMY  12-30-2010   SYMPTOMATIC UTERINE FIBROIDS   UPPER TEETH EXTRACTION'S  1992   VIDEO BRONCHOSCOPY WITH RADIAL ENDOBRONCHIAL ULTRASOUND  07/09/2023   Procedure: VIDEO BRONCHOSCOPY WITH RADIAL ENDOBRONCHIAL ULTRASOUND;  Surgeon: Leslye Peer, MD;  Location: [facility] ENDOSCOPY;  Service: Pulmonary;;    Social History:  reports that she quit smoking about 19 years ago. Her smoking use included cigarettes. She started smoking about 39 years ago. She has a 20 pack-year smoking history. She has never used smokeless tobacco. She reports that she does not drink alcohol and does not use drugs.   Allergies  Allergen Reactions   Codeine Other (See Comments)    Avoids-felt bad when took before Pt claims she can have this but does not like to take.    Family History  Problem Relation Age of Onset   Hyperlipidemia Mother    Asthma Mother    Lupus Mother    Breast cancer Mother 29   Hypertension Father    Hyperlipidemia Father    Hypertension Sister    Hypertension Brother    Cancer Neg Hx    Diabetes Neg Hx    Coronary artery disease Neg Hx      Prior to Admission medications   Medication Sig Start Date End Date Taking? Authorizing Provider  acetaminophen (TYLENOL) 325 MG tablet Take 650 mg by mouth every 6 (six) hours as needed for mild pain or moderate pain.    [provider]  albuterol (PROVENTIL) (2.5 MG/3ML) 0.083% nebulizer solution Take 3 mLs (2.5 mg total) by nebulization every 6 (six) hours as needed for wheezing or shortness of breath. 12/22/18   Osvaldo Shipper, MD  albuterol (VENTOLIN HFA) 108 (90 Base) MCG/ACT inhaler INHALE 2 PUFFS EVERY 6 HOURS AS NEEDED FOR WHEEZING OR SHORTNESS OF BREATH Patient  taking differently: Inhale 2 puffs into the lungs every 6 (six) hours as needed for shortness of breath or wheezing. 09/23/19   Nyoka Cowden, MD  aspirin EC 81 MG tablet Take 1 tablet (81 mg total) by mouth daily. Start 02/23/19 02/21/19   Pokhrel, Rebekah Chesterfield, MD  atorvastatin (LIPITOR) 20 MG tablet TAKE 1 TABLET (20 MG TOTAL) BY MOUTH DAILY. 03/28/21   Sandre Kitty, MD  dexamethasone (DECADRON) 4 MG tablet One twice daily with food until better then one daily 07/17/23   Nyoka Cowden, MD  fluticasone (FLONASE) 50 MCG/ACT nasal spray USE 2 SPRAYS IN BOTH  NOSTRILS DAILY AS NEEDED  FOR ALLERGIES Patient taking differently: Place 2 sprays into both nostrils daily as needed for allergies. 08/15/21   Nyoka Cowden, MD  hydroxychloroquine (PLAQUENIL) 200 MG tablet TAKE 1 TABLET BY MOUTH DAILY 03/19/23   Nyoka Cowden, MD  lidocaine-prilocaine (EMLA) cream Apply to affected area once 08/01/23   Si Gaul, MD  metoprolol tartrate (LOPRESSOR) 25 MG tablet Take 1 tablet (25 mg  total) by mouth daily. 07/09/23   Leslye Peer, MD  montelukast (SINGULAIR) 10 MG tablet Take 10 mg by mouth at bedtime. 07/31/22   [provider]  Multiple Vitamin (MULTIVITAMIN) capsule Take 1 capsule by mouth daily.    [provider]  omeprazole (PRILOSEC) 20 MG capsule TAKE 1 CAPSULE EVERY DAY Patient taking differently: Take 20 mg by mouth daily. 01/03/21   Sandre Kitty, MD  ondansetron (ZOFRAN) 4 MG tablet Take 1 tablet (4 mg total) by mouth every 6 (six) hours as needed for nausea. 06/28/23   Alwyn Ren, MD  OXYGEN 2lpm with rest and 3lpm with exertion    [provider]  prochlorperazine (COMPAZINE) 10 MG tablet Take 1 tablet (10 mg total) by mouth every 6 (six) hours as needed for nausea or vomiting. 08/01/23   Si Gaul, MD  sertraline (ZOLOFT) 50 MG tablet TAKE 1 TABLET EVERY DAY Patient taking differently: Take 50 mg by mouth daily. 09/11/20   Sandre Kitty, MD   STIOLTO RESPIMAT 2.5-2.5 MCG/ACT AERS USE 2 INHALATIONS BY MOUTH DAILY 11/02/22   Nyoka Cowden, MD  triamterene-hydrochlorothiazide (MAXZIDE-25) 37.5-25 MG tablet TAKE 1 TABLET EVERY DAY Patient taking differently: Take 1 tablet by mouth daily. TAKE 1 TABLET EVERY DAY 06/02/21   Nyoka Cowden, MD    Physical Exam: BP 136/78 (BP Location: Left Arm)   Pulse (!) 112   Temp 98.3 F (36.8 C) (Oral)   Resp 16   Ht 5\' 5"  (1.651 m)   Wt 74.4 kg   SpO2 96%   BMI 27.29 kg/m   General:  Alert, oriented, calm, in no acute distress, looks very tired, pleasant and cooperative and a pretty good historian.  Overall she does look a little dehydrated. Cardiovascular: RRR, no murmurs or rubs, no peripheral edema  Respiratory: clear to auscultation bilaterally, no wheezes, no crackles  Abdomen: soft, nontender, nondistended, normal bowel tones heard  Skin: dry, no rashes  Musculoskeletal: no joint effusions, normal range of motion  Psychiatric: appropriate affect, normal speech  Neurologic: extraocular muscles intact, clear speech, moving all extremities with intact sensorium         Labs on Admission:  Basic Metabolic Panel: Recent Labs  Lab 08/27/23 1205  NA 137  K 4.5  CL 91*  CO2 29  GLUCOSE 80  BUN 29*  CREATININE 1.36*  CALCIUM 9.6   Liver Function Tests: Recent Labs  Lab 08/27/23 1205  AST 13*  ALT 13  ALKPHOS 89  BILITOT 0.8  PROT 7.7  ALBUMIN 2.9*   No results for input(s): "LIPASE", "AMYLASE" in the last 168 hours. No results for input(s): "AMMONIA" in the last 168 hours. CBC: Recent Labs  Lab 08/27/23 1205  WBC 14.6*  NEUTROABS 12.9*  HGB 9.6*  HCT 31.6*  MCV 80.0  PLT 489*   Cardiac Enzymes: No results for input(s): "CKTOTAL", "CKMB", "CKMBINDEX", "TROPONINI" in the last 168 hours.  BNP (last 3 results) No results for input(s): "BNP" in the last 8760 hours.  ProBNP (last 3 results) No results for input(s): "PROBNP" in the last 8760  hours.  CBG: Recent Labs  Lab 08/27/23 1137  GLUCAP 76    Radiological Exams on Admission: DG Chest Port 1 View  Result Date: 08/27/2023 CLINICAL DATA:  Weakness.  History of lung cancer EXAM: PORTABLE CHEST 1 VIEW COMPARISON:  X-ray 07/09/2023.  PET-CT scan 08/03/2023 FINDINGS: Volume loss of the right hemithorax with slightly increasing opacity in the right  upper lung compared to prior x-ray. Surgical clips in the right axillary region. Left lung is grossly clear. No pneumothorax, effusion or edema. Normal cardiopericardial silhouette. Interval placement of a right IJ chest port with the tip along the upper SVC. Overlapping cardiac leads. IMPRESSION: Chest port. Increasing parenchymal opacity in the right upper lung. Acute infiltrative process is possible. Please correlate for known history of a right upper lobe lung cancer. Electronically Signed   By: Karen Kays M.D.   On: 08/27/2023 14:33    Assessment/Plan [name] is a 62 y.o. 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  Acute renal failure-likely due to moderate hypotension, dehydration from poor oral intake the last few days in the setting of active malignancy and ongoing brain radiation. -Observation admission -Gentle IV fluid hydration -Avoid nephrotoxins, renally dose medications -Trend renal function with daily labs  Leukocytosis-I suspect this is reactive given her active malignancy and radiation, no signs or symptoms of active infection  Chronic anemia-stable  Hypertension-continue home Lopressor in the morning, hold other antihypertensives  Mild confusion-patient appears alert and oriented x 3  currently, may be related to intracranial metastasis and radiation.  Follow-up CT head  GERD-Protonix  Depression-Zoloft  DVT prophylaxis: Lovenox     Code Status: Limited: Do not attempt resuscitation (DNR) -DNR-LIMITED -Do Not Intubate/DNI .  Patient presents with goldenrod form, also confirms DNR/DNI status with me in the ER at the time of admission.  Consults called: None  Admission status: Observation  Time spent: 59 minutes  Percy Winterrowd Sharlette Dense MD [facility] Pager [phone]  If 7PM-7AM, please contact night-coverage [email or web address] Password [email or web address]  08/27/2023, 4:53 PM

## 2023-08-27 NOTE — ED Notes (Signed)
ED TO INPATIENT HANDOFF REPORT  Name/Age/Gender [name] 62 y.o. female  Code Status    Code Status Orders  (From admission, onward)           Start     Ordered   08/27/23 1653  Do not attempt resuscitation (DNR)- Limited -Do Not Intubate (DNI)  Continuous       Question Answer Comment  If pulseless and not breathing No CPR or chest compressions.   In Pre-Arrest Conditions (Patient Is Breathing and Has A Pulse) Do not intubate. Provide all appropriate non-invasive medical interventions. Avoid ICU transfer unless indicated or required.   Consent: Discussion documented in EHR or advanced directives reviewed      08/27/23 1653           Code Status History     Date Active Date Inactive Code Status Order ID Comments User Context   08/01/2023 1159 08/02/2023 0507 Full Code [id number]  Gilmer Mor, DO HOV   06/26/2023 2136 06/28/2023 1638 DNR [id number]  Carollee Herter, DO ED   06/26/2023 2102 06/26/2023 2136 DNR [id number]  Carollee Herter, DO ED   02/19/2019 2253 02/21/2019 1603 Full Code [id number]  Eduard Clos, MD ED   12/17/2018 1806 12/22/2018 2111 Full Code [id number]  Lorin Glass, MD Inpatient   09/08/2017 0012 09/09/2017 1821 Full Code [id number]  Lovena Neighbours, MD Inpatient   10/06/2016 2241 10/10/2016 1928 Full Code [id number]  Michael Litter, MD Inpatient   10/08/2015 1814 10/11/2015 1714 Full Code [id number]  Bonney Aid, MD Inpatient       Home/SNF/Other Home  Chief Complaint AKI (acute kidney injury) (HCC) [N17.9]  Level of Care/Admitting Diagnosis ED Disposition     ED Disposition  Admit   Condition  --   Comment  Hospital Area: [facility] [[id number]]  Level of Care: Progressive [102]  Admit to Progressive based on following criteria: MULTISYSTEM THREATS such as stable sepsis, metabolic/electrolyte imbalance with or without encephalopathy that is responding to early treatment.  May place patient in observation at [facility] or  [facility] if equivalent level of care is available:: Yes  Covid Evaluation: Asymptomatic - no recent exposure (last 10 days) testing not required  Diagnosis: AKI (acute kidney injury) [facility]) [[id number]]  Admitting Physician: Maryln Gottron [[id number]]  Attending Physician: Kirby Crigler, MIR [email or web address] [[id number]]          Medical History Past Medical History:  Diagnosis Date   Acute on chronic respiratory failure (HCC) 10/09/2016   Allergic rhinitis    Breast cancer (HCC) 2016   right breast   COPD, 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    IV Location/Drains/Wounds Patient Lines/Drains/Airways Status     Active Line/Drains/Airways     Name Placement date Placement time Site Days   Implanted Port 08/01/23 Right Chest 08/01/23  1141  Chest  26   Peripheral IV 07/20/23 20 G 1" Anterior;Left Forearm 07/20/23  1143  Forearm  38   Peripheral IV 07/24/23 24 G 1" Anterior;Left;Proximal Forearm 07/24/23  1035  Forearm  34   Peripheral IV 08/27/23 22 G 1" Left Antecubital 08/27/23  1205  Antecubital  less than 1   Wound / Incision (Open or Dehisced) 08/01/23 Puncture Neck Right 08/01/23  1138  Neck  26            Labs/Imaging Results for orders placed or performed during the hospital encounter of 08/27/23 (from the past 48 hour(s))  CBG monitoring, ED     Status: None   Collection Time: 08/27/23 11:37 AM  Result Value Ref Range    Glucose-Capillary 76 70 - 99 mg/dL    Comment: Glucose reference range applies only to samples taken after fasting for at least 8 hours.  Resp panel by RT-PCR (RSV, Flu A&B, Covid) Anterior Nasal Swab     Status: None   Collection Time: 08/27/23 11:50 AM   Specimen: Anterior Nasal Swab  Result Value Ref Range   SARS Coronavirus 2 by RT PCR NEGATIVE NEGATIVE    Comment: (NOTE) SARS-CoV-2 target nucleic acids are NOT DETECTED.  The SARS-CoV-2 RNA is generally detectable in upper respiratory specimens during the acute phase of infection. The lowest concentration of SARS-CoV-2 viral copies this assay can detect is 138 copies/mL. A negative result does not preclude SARS-Cov-2 infection and should not be used as the sole basis for treatment or other patient management decisions. A negative result may occur with  improper specimen collection/handling, submission of specimen other than nasopharyngeal swab, presence of viral mutation(s) within the areas targeted by this assay, and inadequate number of viral copies(<138 copies/mL). A negative result must be combined with clinical observations, patient history, and epidemiological information. The expected result is Negative.  Fact Sheet for Patients:  BloggerCourse.com  Fact Sheet for Healthcare Providers:  SeriousBroker.it  This test is no t yet approved or cleared by the [place] FDA and  has been authorized for detection and/or diagnosis of SARS-CoV-2 by FDA under an Emergency Use Authorization (EUA). This EUA will remain  in effect (meaning this test can be used) for the duration of the COVID-19 declaration under Section 564(b)(1) of the Act, 21 U.S.C.section 360bbb-3(b)(1), unless the authorization is terminated  or revoked sooner.       Influenza A by PCR NEGATIVE NEGATIVE   Influenza B by PCR NEGATIVE NEGATIVE    Comment: (NOTE) The Xpert Xpress SARS-CoV-2/FLU/RSV plus assay  is intended as an aid in the diagnosis of influenza from Nasopharyngeal swab specimens and should not be used as a sole basis for treatment. Nasal washings and aspirates are unacceptable for Xpert Xpress SARS-CoV-2/FLU/RSV testing.  Fact Sheet for Patients: BloggerCourse.com  Fact Sheet for Healthcare Providers: SeriousBroker.it  This test is not yet approved or cleared by the [place] FDA and has been authorized for detection and/or diagnosis of SARS-CoV-2 by FDA under an Emergency Use Authorization (EUA). This EUA will remain in effect (meaning this test can be used) for the duration of the COVID-19 declaration under Section 564(b)(1) of the Act, 21 U.S.C. section 360bbb-3(b)(1), unless the authorization is terminated or revoked.     Resp Syncytial Virus by PCR NEGATIVE NEGATIVE    Comment: (NOTE) Fact Sheet for Patients: BloggerCourse.com  Fact Sheet for Healthcare Providers: SeriousBroker.it  This test is not yet approved or cleared by the [place]  FDA and has been authorized for detection and/or diagnosis of SARS-CoV-2 by FDA under an Emergency Use Authorization (EUA). This EUA will remain in effect (meaning this test can be used) for the duration of the COVID-19 declaration under Section 564(b)(1) of the Act, 21 U.S.C. section 360bbb-3(b)(1), unless the authorization is terminated or revoked.  Performed at [facility], [address], [place], [place] [address]   Comprehensive metabolic panel     Status: Abnormal   Collection Time: 08/27/23 12:05 PM  Result Value Ref Range   Sodium 137 135 - 145 mmol/L   Potassium 4.5 3.5 - 5.1 mmol/L   Chloride 91 (L) 98 - 111 mmol/L   CO2 29 22 - 32 mmol/L   Glucose, Bld 80 70 - 99 mg/dL    Comment: Glucose reference range applies only to samples taken after fasting for at least 8 hours.   BUN 29 (H)  8 - 23 mg/dL   Creatinine, Ser 6.60 (H) 0.44 - 1.00 mg/dL   Calcium 9.6 8.9 - 63.0 mg/dL   Total Protein 7.7 6.5 - 8.1 g/dL   Albumin 2.9 (L) 3.5 - 5.0 g/dL   AST 13 (L) 15 - 41 U/L   ALT 13 0 - 44 U/L   Alkaline Phosphatase 89 38 - 126 U/L   Total Bilirubin 0.8 0.3 - 1.2 mg/dL   GFR, Estimated 44 (L) >60 mL/min    Comment: (NOTE) Calculated using the CKD-EPI Creatinine Equation (2021)    Anion gap 17 (H) 5 - 15    Comment: Performed at [facility], [address], [place], [place] [address]  Troponin I (High Sensitivity)     Status: None   Collection Time: 08/27/23 12:05 PM  Result Value Ref Range   Troponin I (High Sensitivity) 7 <18 ng/L    Comment: (NOTE) Elevated high sensitivity troponin I (hsTnI) values and significant  changes across serial measurements may suggest ACS but many other  chronic and acute conditions are known to elevate hsTnI results.  Refer to the "Links" section for chest pain algorithms and additional  guidance. Performed at [facility], [address] [place], [place] [address]   CBC with Differential     Status: Abnormal   Collection Time: 08/27/23 12:05 PM  Result Value Ref Range   WBC 14.6 (H) 4.0 - 10.5 K/uL   RBC 3.95 3.87 - 5.11 MIL/uL   Hemoglobin 9.6 (L) 12.0 - 15.0 g/dL   HCT 57.3 (L) 22.0 - 25.4 %   MCV 80.0 80.0 - 100.0 fL   MCH 24.3 (L) 26.0 - 34.0 pg   MCHC 30.4 30.0 - 36.0 g/dL   RDW 27.0 (H) 62.3 - 76.2 %   Platelets 489 (H) 150 - 400 K/uL   nRBC 0.0 0.0 - 0.2 %   Neutrophils Relative % 88 %   Neutro Abs 12.9 (H) 1.7 - 7.7 K/uL   Lymphocytes Relative 3 %   Lymphs Abs 0.4 (L) 0.7 - 4.0 K/uL   Monocytes Relative 6 %   Monocytes Absolute 0.8 0.1 - 1.0 K/uL   Eosinophils Relative 2 %   Eosinophils Absolute 0.2 0.0 - 0.5 K/uL   Basophils Relative 0 %   Basophils Absolute 0.0 0.0 - 0.1 K/uL   Immature Granulocytes 1 %   Abs Immature Granulocytes 0.16 (H) 0.00 - 0.07 K/uL    Comment:  Performed at [facility], [address], [place], [place] [address]  Urinalysis,  Routine w reflex microscopic -Urine, Clean Catch     Status: Abnormal   Collection Time: 08/27/23 12:07 PM  Result Value Ref Range   Color, Urine YELLOW YELLOW   APPearance CLEAR CLEAR   Specific Gravity, Urine 1.012 1.005 - 1.030   pH 5.0 5.0 - 8.0   Glucose, UA NEGATIVE NEGATIVE mg/dL   Hgb urine dipstick SMALL (A) NEGATIVE   Bilirubin Urine NEGATIVE NEGATIVE   Ketones, ur NEGATIVE NEGATIVE mg/dL   Protein, ur NEGATIVE NEGATIVE mg/dL   Nitrite NEGATIVE NEGATIVE   Leukocytes,Ua MODERATE (A) NEGATIVE   RBC / HPF 0-5 0 - 5 RBC/hpf   WBC, UA 0-5 0 - 5 WBC/hpf   Bacteria, UA RARE (A) NONE SEEN   Squamous Epithelial / HPF 0-5 0 - 5 /HPF   Mucus PRESENT    Hyaline Casts, UA PRESENT     Comment: Performed at [facility], [address] [place], [place] [address]  I-Stat Lactic Acid     Status: None   Collection Time: 08/27/23 12:18 PM  Result Value Ref Range   Lactic Acid, Venous 1.1 0.5 - 1.9 mmol/L  Troponin I (High Sensitivity)     Status: None   Collection Time: 08/27/23  2:51 PM  Result Value Ref Range   Troponin I (High Sensitivity) 8 <18 ng/L    Comment: (NOTE) Elevated high sensitivity troponin I (hsTnI) values and significant  changes across serial measurements may suggest ACS but many other  chronic and acute conditions are known to elevate hsTnI results.  Refer to the "Links" section for chest pain algorithms and additional  guidance. Performed at [facility], [address] [place], [place] [address]   I-Stat Lactic Acid     Status: None   Collection Time: 08/27/23  2:58 PM  Result Value Ref Range   Lactic Acid, Venous 1.2 0.5 - 1.9 mmol/L   *Note: Due to a large number of results and/or encounters for the requested time period, some results have not been displayed. A complete set of results can be found in Results Review.    DG Chest Port 1 View  Result Date: 08/27/2023 CLINICAL DATA:  Weakness.  History of lung cancer EXAM: PORTABLE CHEST 1 VIEW COMPARISON:  X-ray 07/09/2023.  PET-CT scan 08/03/2023 FINDINGS: Volume loss of the right hemithorax with slightly increasing opacity in the right upper lung compared to prior x-ray. Surgical clips in the right axillary region. Left lung is grossly clear. No pneumothorax, effusion or edema. Normal cardiopericardial silhouette. Interval placement of a right IJ chest port with the tip along the upper SVC. Overlapping cardiac leads. IMPRESSION: Chest port. Increasing parenchymal opacity in the right upper lung. Acute infiltrative process is possible. Please correlate for known history of a right upper lobe lung cancer. Electronically Signed   By: Karen Kays M.D.   On: 08/27/2023 14:33    Pending Labs Unresulted Labs (From admission, onward)     Start     Ordered   08/28/23 0500  Basic metabolic panel  Tomorrow morning,   R        08/27/23 1653   08/28/23 0500  CBC  Tomorrow morning,   R        08/27/23 1653   08/27/23 1216  Group A Strep by PCR  Once,   URGENT        08/27/23 1215            Vitals/Pain Today's Vitals   08/27/23  1129 08/27/23 1223 08/27/23 1525  BP: 102/71  136/78  Pulse: (!) 112  (!) 112  Resp: 13  16  Temp: 98.3 F (36.8 C)  98.3 F (36.8 C)  TempSrc: Oral  Oral  SpO2: 97%  96%  Weight:  164 lb (74.4 kg)   Height:  5\' 5"  (1.651 m)     Isolation Precautions No active isolations  Medications Medications  0.9 %  sodium chloride infusion (has no administration in time range)  hydroxychloroquine (PLAQUENIL) tablet 200 mg (has no administration in time range)  metoprolol tartrate (LOPRESSOR) tablet 25 mg (has no administration in time range)  sertraline (ZOLOFT) tablet 50 mg (has no administration in time range)  pantoprazole (PROTONIX) EC tablet 40 mg (has no administration in time range)  montelukast (SINGULAIR) tablet 10 mg (has  no administration in time range)  enoxaparin (LOVENOX) injection 40 mg (has no administration in time range)  acetaminophen (TYLENOL) tablet 650 mg (has no administration in time range)    Or  acetaminophen (TYLENOL) suppository 650 mg (has no administration in time range)  traZODone (DESYREL) tablet 25 mg (has no administration in time range)  ondansetron (ZOFRAN) tablet 4 mg (has no administration in time range)    Or  ondansetron (ZOFRAN) injection 4 mg (has no administration in time range)  albuterol (PROVENTIL) (2.5 MG/3ML) 0.083% nebulizer solution 2.5 mg (has no administration in time range)  sodium chloride 0.9 % bolus 1,000 mL (1,000 mLs Intravenous New Bag/Given 08/27/23 1205)    Mobility manual wheelchair

## 2023-08-28 ENCOUNTER — Ambulatory Visit
Admission: RE | Admit: 2023-08-28 | Discharge: 2023-08-28 | Disposition: A | Discharge status: Home / Self Care | Payer: 59 | Source: Ambulatory Visit | Attending: Radiation Oncology | Admitting: Radiation Oncology

## 2023-08-28 ENCOUNTER — Other Ambulatory Visit: Payer: Self-pay

## 2023-08-28 DIAGNOSIS — I10 Essential (primary) hypertension: Secondary | ICD-10-CM

## 2023-08-28 DIAGNOSIS — C3490 Malignant neoplasm of unspecified part of unspecified bronchus or lung: Secondary | ICD-10-CM

## 2023-08-28 DIAGNOSIS — F32A Depression, unspecified: Secondary | ICD-10-CM

## 2023-08-28 DIAGNOSIS — R1310 Dysphagia, unspecified: Secondary | ICD-10-CM

## 2023-08-28 DIAGNOSIS — N179 Acute kidney failure, unspecified: Secondary | ICD-10-CM | POA: Diagnosis not present

## 2023-08-28 DIAGNOSIS — C7940 Secondary malignant neoplasm of unspecified part of nervous system: Secondary | ICD-10-CM | POA: Diagnosis not present

## 2023-08-28 DIAGNOSIS — R410 Disorientation, unspecified: Secondary | ICD-10-CM

## 2023-08-28 DIAGNOSIS — J189 Pneumonia, unspecified organism: Secondary | ICD-10-CM | POA: Diagnosis present

## 2023-08-28 DIAGNOSIS — C7931 Secondary malignant neoplasm of brain: Secondary | ICD-10-CM

## 2023-08-28 DIAGNOSIS — D72829 Elevated white blood cell count, unspecified: Secondary | ICD-10-CM | POA: Diagnosis not present

## 2023-08-28 DIAGNOSIS — K219 Gastro-esophageal reflux disease without esophagitis: Secondary | ICD-10-CM

## 2023-08-28 LAB — BASIC METABOLIC PANEL
Anion gap: 14 (ref 5–15)
BUN: 24 mg/dL — ABNORMAL HIGH (ref 8–23)
CO2: 26 mmol/L (ref 22–32)
Calcium: 9 mg/dL (ref 8.9–10.3)
Chloride: 98 mmol/L (ref 98–111)
Creatinine, Ser: 1.11 mg/dL — ABNORMAL HIGH (ref 0.44–1.00)
GFR, Estimated: 56 mL/min — ABNORMAL LOW (ref >60)
Glucose, Bld: 92 mg/dL (ref 70–99)
Potassium: 3.8 mmol/L (ref 3.5–5.1)
Sodium: 138 mmol/L (ref 135–145)

## 2023-08-28 LAB — RAD ONC ARIA SESSION SUMMARY
Course Elapsed Days: 15
Plan Fractions Treated to Date: 8
Plan Prescribed Dose Per Fraction: 7.5 Gy
Plan Total Fractions Prescribed: 8
Plan Total Prescribed Dose: 60 Gy
Reference Point Dosage Given to Date: 60 Gy
Reference Point Session Dosage Given: 7.5 Gy
Session Number: 8

## 2023-08-28 LAB — CBC
HCT: 27.9 % — ABNORMAL LOW (ref 36.0–46.0)
Hemoglobin: 8.5 g/dL — ABNORMAL LOW (ref 12.0–15.0)
MCH: 24.7 pg — ABNORMAL LOW (ref 26.0–34.0)
MCHC: 30.5 g/dL (ref 30.0–36.0)
MCV: 81.1 fL (ref 80.0–100.0)
Platelets: 448 10*3/uL — ABNORMAL HIGH (ref 150–400)
RBC: 3.44 MIL/uL — ABNORMAL LOW (ref 3.87–5.11)
RDW: 16.4 % — ABNORMAL HIGH (ref 11.5–15.5)
WBC: 14.8 10*3/uL — ABNORMAL HIGH (ref 4.0–10.5)
nRBC: 0 % (ref 0.0–0.2)

## 2023-08-28 MED ORDER — SODIUM CHLORIDE 0.9 % IV SOLN: INTRAVENOUS | Status: DC

## 2023-08-28 MED ORDER — MAGIC MOUTHWASH W/LIDOCAINE
10.0000 mL | Freq: Three times a day (TID) | ORAL | Status: DC
  Administered 2023-08-28 – 2023-09-01 (×12): 10 mL via ORAL
  Filled 2023-08-28 (×13): qty 10

## 2023-08-28 MED ORDER — NYSTATIN 100000 UNIT/ML MT SUSP
5.0000 mL | Freq: Four times a day (QID) | OROMUCOSAL | Status: DC
  Administered 2023-08-28 – 2023-09-01 (×13): 500000 [IU] via ORAL
  Filled 2023-08-28 (×13): qty 5

## 2023-08-28 MED ORDER — SODIUM CHLORIDE 0.9 % IV SOLN
1.0000 g | Freq: Every day | INTRAVENOUS | Status: DC
  Administered 2023-08-28 – 2023-08-31 (×4): 1 g via INTRAVENOUS
  Filled 2023-08-28 (×4): qty 10

## 2023-08-28 NOTE — Care Management Obs Status (Signed)
MEDICARE OBSERVATION STATUS NOTIFICATION   Patient Details  Name: [name] MRN: [id number] Date of Birth: 07/20/1961   Medicare Observation Status Notification Given:       Howell Rucks, RN 08/28/2023, 11:40 AM

## 2023-08-28 NOTE — Progress Notes (Signed)
Pt complained of right chest pain. EKG done. VS taken. BP: 113/68; HR 109; Temp 98.1. O2 sat 100 On 2 lpm Oxygen, RR 22. Tylenol given as PRN for pain. Placed on tele monitoring. On call J. Garner Nash, NP notified. CN Lauren, RN made aware. Will monitor pt closely.

## 2023-08-28 NOTE — Evaluation (Signed)
Clinical/Bedside Swallow Evaluation Patient Details  Name: [name] MRN: [id number] Date of Birth: 06-07-1961  Today's Date: 08/28/2023 Time: SLP Start Time (ACUTE ONLY): 1036 SLP Stop Time (ACUTE ONLY): 1125 SLP Time Calculation (min) (ACUTE ONLY): 49 min  Past Medical History:  Past Medical History:  Diagnosis Date   Acute on chronic respiratory failure (HCC) 10/09/2016   Allergic rhinitis    Breast cancer (HCC) 2016   right breast   COPD, mild (HCC) FOLLOWED BY DR [name]   Depression    GERD (gastroesophageal reflux disease)    HTN (hypertension)    Hypertension    Iron deficiency anemia    Long-term current use of steroids SYMBICORT INHALER   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    Overweight (BMI 25.0-29.9) 05/11/2016   Palpitations    Personal history of radiation therapy 2016   Sarcoidosis STABLE PER CXR JUNE 2013   Shortness of breath    Sickle cell trait (HCC)    Past Surgical History:  Past Surgical History:  Procedure Laterality Date   ADJUSTABLE SUTURE MANIPULATION  05/22/2012   Procedure: ADJUSTABLE SUTURE MANIPULATION;  Surgeon: Corinda Gubler, MD;  [facility];  Service: Ophthalmology;  Laterality: Right;   BREAST LUMPECTOMY Right 2016   BRONCHIAL BIOPSY  07/09/2023   Procedure: BRONCHIAL BIOPSIES;  Surgeon: Leslye Peer, MD;  Location: [facility] ENDOSCOPY;  Service: Pulmonary;;   BRONCHIAL BRUSHINGS  07/09/2023   Procedure: BRONCHIAL BRUSHINGS;  Surgeon: Leslye Peer, MD;  Location: [facility] ENDOSCOPY;  Service: Pulmonary;;   BRONCHIAL NEEDLE ASPIRATION BIOPSY  07/09/2023   Procedure: BRONCHIAL NEEDLE ASPIRATION BIOPSIES;  Surgeon: Leslye Peer, MD;  Location: [facility] ENDOSCOPY;  Service: Pulmonary;;   CESAREAN SECTION  1986   W/ BILATERAL TUBAL LIGATION   COLONOSCOPY WITH PROPOFOL N/A 02/21/2019   Procedure: COLONOSCOPY WITH PROPOFOL;  Surgeon: Hilarie Fredrickson, MD;  Location: [facility] ENDOSCOPY;  Service: Endoscopy;  Laterality: N/A;   EYE SURGERY     IR IMAGING  GUIDED PORT INSERTION  08/01/2023   MEDIAN RECTUS REPAIR  05/22/2012   Procedure: MEDIAN RECTUS REPAIR;  Surgeon: Corinda Gubler, MD;  Location: [facility];  Service: Ophthalmology;  Laterality: Bilateral;  INFERIOR RECTUS RESECTION WITH ADJUSTIBLE SUTURES RIGHT EYE    POLYPECTOMY  02/21/2019   Procedure: POLYPECTOMY;  Surgeon: Hilarie Fredrickson, MD;  Location: [facility] ENDOSCOPY;  Service: Endoscopy;;   RADIOACTIVE SEED GUIDED PARTIAL MASTECTOMY WITH AXILLARY SENTINEL LYMPH NODE BIOPSY Right 08/18/2015   Procedure: RADIOACTIVE SEED GUIDED PARTIAL MASTECTOMY WITH AXILLARY SENTINEL LYMPH NODE BIOPSY;  Surgeon: Almond Lint, MD;  Location: [facility];  Service: General;  Laterality: Right;   TOTAL ROBOTIC ASSISTED LAPAROSCOPIC HYSTERECTOMY  12-30-2010   SYMPTOMATIC UTERINE FIBROIDS   UPPER TEETH EXTRACTION'S  1992   VIDEO BRONCHOSCOPY WITH RADIAL ENDOBRONCHIAL ULTRASOUND  07/09/2023   Procedure: VIDEO BRONCHOSCOPY WITH RADIAL ENDOBRONCHIAL ULTRASOUND;  Surgeon: Leslye Peer, MD;  Location: [facility] ENDOSCOPY;  Service: Pulmonary;;   HPI:  62 yo female adm to [facility] with weakness, dehydration and AKI.  Pt with PMH + for metastatic lung cancer and is undergoing intracranial radiation, also has h/o right breast cancer s/p lumpectomy and XRT, COPD, prior smoker - quit 19 years ago, GERD, allergic rhinitis.  Pt brain CT showed left occipital lobe hypodensity could be interval infarct or mets new since 07/20/2023. In addition, pt with right frontoparietal edema similar to prior.  Swallow eval ordered by MD due to pt report of odynophagia.  Pt reports throat pain ongoing for the last few days only and is receiving Chloraseptic.   Pt denies lingual pain to this SLP.  CXR showed increased opacity right upper, ? acute infiltrate RUL and pt has chest port.    Assessment / Plan / Recommendation  Clinical Impression  Patient appears with ulcerations on tongue - lateral and surface areas as well as  erythema on anterior tongue - She denies lingual discomfort but is wincing with swalloiwng.  Upon examination of oropharynx - she was clear - however concern present to potential ulcerations in pharynx contributing to pain.  Recommend medical management of potential ulcerations.  No focal CN deficits apparent.  Pt willing to try to consume eggs, grits, bacon, OJ, and coffee. RN also giving medications - After RN left room, pt overtly started coughing and expectorating secretions - requiring several minutes to recover.  She reported sensation of pill lodging *Pointing proximal in pharynx* but did not attempt to expectorate to clear. She also expectorated eggs that were too hard to manage - thus she will modify solids prn.  Understandably pt did not want more po following this event.  She is willing to take medications with applesauce - and SlP went through menu - marking out hard entrees that may be difficult for her to consume. Pt admitted "It felt like something went the wrong way".  Recommend continue diet - with precautions, SLP will follow up dysphagia management to determine if instrumental evaluation indicated. SLP Visit Diagnosis: Dysphagia, oropharyngeal phase (R13.12)    Aspiration Risk  Moderate aspiration risk;Mild aspiration risk    Diet Recommendation Dysphagia 3 (Mech soft);Thin liquid;Regular    Liquid Administration via: Cup;Straw Medication Administration: Whole meds with puree Supervision: Patient able to self feed Compensations: Slow rate;Small sips/bites Postural Changes: Seated upright at 90 degrees    Other  Recommendations Oral Care Recommendations: Oral care BID    Recommendations for follow up therapy are one component of a multi-disciplinary discharge planning process, led by the attending physician.  Recommendations may be updated based on patient status, additional functional criteria and insurance authorization.  Follow up Recommendations Follow physician's  recommendations for discharge plan and follow up therapies      Assistance Recommended at Discharge  TBD  Functional Status Assessment Patient has had a recent decline in their functional status and demonstrates the ability to make significant improvements in function in a reasonable and predictable amount of time.  Frequency and Duration min 1 x/week  1 week       Prognosis Prognosis for improved oropharyngeal function: Fair Barriers to Reach Goals: Severity of deficits      Swallow Study   General Date of Onset: 08/28/23 HPI: 62 yo female adm to [facility] with weakness, dehydration and AKI.  Pt with PMH + for metastatic lung cancer and is undergoing intracranial radiation, also has h/o right breast cancer s/p lumpectomy and XRT, COPD, prior smoker - quit 19 years ago, GERD, allergic rhinitis.  Pt brain CT showed left occipital lobe hypodensity could be interval infarct or mets new since 07/20/2023. In addition, pt with right frontoparietal edema similar to prior.  Swallow eval ordered by MD due to pt report of odynophagia.  Pt reports throat pain ongoing for the last few days only and is receiving Chloraseptic.   Pt denies lingual pain to this SLP.  CXR showed increased opacity right upper, ? acute infiltrate RUL and pt has chest port. Type of Study: Bedside Swallow Evaluation Diet Prior to  this Study: Regular;Thin liquids (Level 0) Temperature Spikes Noted: No Respiratory Status: Nasal cannula History of Recent Intubation: No Oral Cavity Assessment: Within Functional Limits Oral Care Completed by SLP: No Oral Cavity - Dentition: Edentulous Vision: Functional for self-feeding Self-Feeding Abilities: Able to feed self Patient Positioning: Upright in chair Baseline Vocal Quality: Low vocal intensity Volitional Cough: Weak Volitional Swallow: Unable to elicit    Oral/Motor/Sensory Function Overall Oral Motor/Sensory Function: Within functional limits   Ice Chips Ice chips: Not tested    Thin Liquid Thin Liquid: Impaired Presentation: Cup;Self Fed;Straw Pharyngeal  Phase Impairments: Suspected delayed Swallow;Cough - Immediate    Nectar Thick Nectar Thick Liquid: Not tested   Honey Thick Honey Thick Liquid: Not tested   Puree Puree: Within functional limits Presentation: Self Fed;Spoon   Solid     Solid: Impaired Presentation: Self Fed Oral Phase Impairments: Reduced lingual movement/coordination;Impaired mastication Oral Phase Functional Implications: Other (comment);Impaired mastication;Prolonged oral transit (expectorated egg boluse that had been poorly masticated)      Chales Abrahams 08/28/2023,11:49 AM Rolena Infante, MS [facility] SLP Acute Rehab Services Office [phone]

## 2023-08-28 NOTE — TOC Initial Note (Addendum)
Transition of Care [facility]) - Initial/Assessment Note    Patient Details  Name: [name] MRN: [id number] Date of Birth: January 22, 1961  Transition of Care [facility]) CM/SW Contact:    Howell Rucks, RN Phone Number: 08/28/2023, 11:45 AM  Clinical Narrative:  Met with pt at bedside to introduce role of TOC/NCM and review for dc planning, Pt reports she has an established PCP and pharmacy, reports no current home care services, reports only home DME is home 02, family will  bring POC to hospital for dc home, reports she feels safe returning home with support from her family. MOON completed. Pt reports fatigue during assessment. PT eval completed, recommendation for HH PT. TOC will follow up with pt when pt better rested and more appropriate time.                    Barriers to Discharge: Continued Medical Work up   Patient Goals and CMS Choice Patient states their goals for this hospitalization and ongoing recovery are:: return home with family support          Expected Discharge Plan and Services       Living arrangements for the past 2 months: Apartment                                      Prior Living Arrangements/Services Living arrangements for the past 2 months: Apartment Lives with:: Adult Children Patient language and need for interpreter reviewed:: Yes Do you feel safe going back to the place where you live?: Yes      Need for Family Participation in Patient Care: Yes (Comment) Care giver support system in place?: Yes (comment) Current home services: DME (Home 02) Criminal Activity/Legal Involvement Pertinent to Current Situation/Hospitalization: No - Comment as needed  Activities of Daily Living   ADL Screening (condition at time of admission) Independently performs ADLs?: Yes (appropriate for developmental age) Is the patient deaf or have difficulty hearing?: No Does the patient have difficulty seeing, even when wearing glasses/contacts?: No Does the  patient have difficulty concentrating, remembering, or making decisions?: No  Permission Sought/Granted                  Emotional Assessment Appearance:: Appears stated age Attitude/Demeanor/Rapport: Lethargic Affect (typically observed): Accepting Orientation: : Oriented to Self, Oriented to Place, Oriented to  Time Alcohol / Substance Use: Not Applicable Psych Involvement: No (comment)  Admission diagnosis:  AKI (acute kidney injury) (HCC) [N17.9] 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, metastatic to unspecified nervous system structure [facility]) [C79.40] Patient Active Problem List   Diagnosis Date Noted   AKI (acute kidney injury) (HCC) 08/27/2023   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) 08/08/2023   Port-A-Cath in place 08/07/2023   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) 07/24/2023   Squamous cell lung cancer, 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   Frontal mass of brain 06/26/2023   Mass of upper lobe of right lung 06/26/2023   DNR (do not resuscitate) 06/26/2023   Cirrhosis (HCC) 03/14/2022   Benign neoplasm of ascending colon    Anemia 01/10/2019   Chronic cough 11/26/2017   Obesity (BMI 30-39.9) 01/10/2017   Dyspnea on exertion 11/09/2016   Chronic respiratory failure with hypoxia and hypercapnia (HCC) 10/29/2016   Depression with anxiety 08/07/2016   Hot flashes 02/01/2016   Breast cancer of upper-outer quadrant of right female breast (  HCC) 08/04/2015   GERD (gastroesophageal reflux disease) 12/21/2012   Hyperlipidemia 12/21/2012   Solitary pulmonary nodule on lung CT 12/21/2012   SICKLE-CELL TRAIT 04/15/2008   Sarcoidosis (HCC) 01/10/2007   HYPERTENSION, BENIGN SYSTEMIC 01/10/2007   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 [place]  ([place]) [place] [address] Phone: [phone] Fax: [phone]     Social Determinants of Health (SDOH) Social History: SDOH Screenings   Food Insecurity: No Food Insecurity (08/27/2023)  Housing: Patient Declined (08/27/2023)  Transportation Needs: No Transportation Needs (08/27/2023)  Utilities: Not At Risk (08/27/2023)  Alcohol Screen: Low Risk  (07/23/2023)  Depression (PHQ2-9): Low Risk  (07/23/2023)  Tobacco Use: Medium Risk (08/27/2023)   SDOH Interventions:     Readmission Risk Interventions     No data to display

## 2023-08-28 NOTE — Evaluation (Signed)
Physical Therapy Evaluation Patient Details Name: [name] MRN: [id number] DOB: 11-09-1961 Today's Date: 08/28/2023  History of Present Illness  62 yo female admitted with AKI/acute renal failure. Hx of COPD-O2 dep at baseline, sarcoidosis, breast Ca, lumpectomy, met lung Ca, chronic resp failure  Clinical Impression  On eval, pt was CGA-Min A for mobility. Pt presents with general weakness, decreased activity tolerance, and impaired gait and balance. Pt reported R knee/LE discomfort during session. Remained on [place] O2 during session-92%, HR 123 with ambulation. Will plan to follow and progress activity as tolerated. Will recommend HHPT f/u and RW, if pt is agreeable.         If plan is discharge home, recommend the following: A little help with walking and/or transfers;A little help with bathing/dressing/bathroom;Assistance with cooking/housework;Assist for transportation;Help with stairs or ramp for entrance   Can travel by private vehicle        Equipment Recommendations Rolling walker (2 wheels) (depending on continued progress)  Recommendations for Other Services       Functional Status Assessment Patient has had a recent decline in their functional status and demonstrates the ability to make significant improvements in function in a reasonable and predictable amount of time.     Precautions / Restrictions Precautions Precautions: Fall Precaution Comments: O2 dep at baseline Restrictions Weight Bearing Restrictions: No      Mobility  Bed Mobility Overal bed mobility: Modified Independent                  Transfers Overall transfer level: Needs assistance   Transfers: Sit to/from Stand Sit to Stand: Contact guard assist           General transfer comment: Unsteady. Increased time.    Ambulation/Gait Ambulation/Gait assistance: Min assist Gait Distance (Feet): 15 Feet (x2) Assistive device: None Gait Pattern/deviations: Step-through pattern,  Decreased stride length       General Gait Details: Gait appeared antalgic-pt reports R knee/LE discomfort. "Furniture cruising" to/from bathroom. Unsteady. Remained on [place] O2-92%.  Stairs            Wheelchair Mobility     Tilt Bed    Modified Rankin (Stroke Patients Only)       Balance Overall balance assessment: Needs assistance         Standing balance support: During functional activity Standing balance-Leahy Scale: Fair                               Pertinent Vitals/Pain Pain Assessment Pain Assessment: Faces Faces Pain Scale: Hurts little more Pain Location: R knee, throat Pain Descriptors / Indicators: Discomfort, Sore Pain Intervention(s): Limited activity within patient's tolerance, Monitored during session, Repositioned    Home Living Family/patient expects to be discharged to:: Private residence Living Arrangements: Children Available Help at Discharge: Family Type of Home: Apartment Home Access: Stairs to enter Entrance Stairs-Rails: Can reach both Entrance Stairs-Number of Steps: 7   Home Layout: One level Home Equipment: None Additional Comments: On 2 L O2 at all times; does bump up if going to be up and cleaning but not for normal ambulation    Prior Function Prior Level of Function : Independent/Modified Independent             Mobility Comments: community ambulation without AD: denies falls ADLs Comments: independent adls and iadls     Extremity/Trunk Assessment   Upper Extremity Assessment Upper Extremity Assessment: Overall WFL for tasks assessed  Lower Extremity Assessment Lower Extremity Assessment: Generalized weakness    Cervical / Trunk Assessment Cervical / Trunk Assessment: Normal  Communication   Communication Communication: No apparent difficulties  Cognition Arousal: Alert Behavior During Therapy: WFL for tasks assessed/performed Overall Cognitive Status: Within Functional Limits for tasks  assessed                                          General Comments      Exercises     Assessment/Plan    PT Assessment Patient needs continued PT services  PT Problem List Decreased strength;Decreased activity tolerance;Decreased balance;Decreased mobility;Pain;Decreased knowledge of use of DME       PT Treatment Interventions DME instruction;Gait training;Functional mobility training;Therapeutic activities;Therapeutic exercise;Patient/family education;Balance training    PT Goals (Current goals can be found in the Care Plan section)  Acute Rehab PT Goals Patient Stated Goal: to feel better PT Goal Formulation: With patient Time For Goal Achievement: 09/11/23 Potential to Achieve Goals: Good    Frequency Min 1X/week     Co-evaluation               AM-PAC PT "6 Clicks" Mobility  Outcome Measure Help needed turning from your back to your side while in a flat bed without using bedrails?: None Help needed moving from lying on your back to sitting on the side of a flat bed without using bedrails?: None Help needed moving to and from a bed to a chair (including a wheelchair)?: A Little Help needed standing up from a chair using your arms (e.g., wheelchair or bedside chair)?: A Little Help needed to walk in hospital room?: A Little Help needed climbing 3-5 steps with a railing? : A Little 6 Click Score: 20    End of Session Equipment Utilized During Treatment: Oxygen Activity Tolerance: Patient limited by fatigue;Patient limited by pain Patient left: in chair;with call bell/phone within reach   PT Visit Diagnosis: Muscle weakness (generalized) (M62.81);Difficulty in walking, not elsewhere classified (R26.2)    Time: 1610-9604 PT Time Calculation (min) (ACUTE ONLY): 14 min   Charges:   PT Evaluation $PT Eval Low Complexity: 1 Low   PT General Charges $$ ACUTE PT VISIT: 1 Visit            Faye Ramsay, PT Acute Rehabilitation  Office:  [phone]

## 2023-08-28 NOTE — Progress Notes (Signed)
PROGRESS NOTE    [name]  ZOX:[id number] DOB: [birth year] DOA: 08/27/2023 PCP: [name]., FNP    No chief complaint on file.   Brief Narrative:  Patient pleasant 62 year old female history of breast cancer status postlumpectomy and XRT, chronic hypoxic respiratory failure on home O2, obesity, sarcoid on Plaquenil, hypertension, recent diagnosis of lung cancer with metastasis to the brain and thoracic spine currently undergoing intracranial radiation presented to the hospital with failure to thrive, AKI, generalized weakness.   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 -Creatinine on admission noted at 1.36. -Urinalysis done moderate leukocytes small hemoglobin, nitrite negative, rare bacteria, 0-5 WBCs. -Renal function improving with hydration. -Continue IV fluids, follow.  2.  Odynophagia -Patient with complaints of odynophagia. -Concern for mucositis. -Place on Magic mouthwash with lidocaine. -Also place on nystatin swish and swallow.  3.  Leukocytosis -Felt likely reactive to malignancy and radiation. -Patient with cough which she states occasionally is productive of a greenish sputum. -Chest x-ray done with increasing parenchymal opacity right upper lobe.  Patient with history of right upper lobe lung cancer.  Acute infiltrative process is possible. -Urinalysis with moderate leukocytes. -Check a urine culture. -Place empirically on IV Rocephin pending urine culture results.  4.  Hypertension -Continue home regimen Lopressor. -Continue to  hold other antihypertensive medications.  5.  GERD -PPI.  6.  Depression -Continue Zoloft.  7.  Mild confusion -Patient alert and oriented x 3. -Concern mild confusion may be related to intracranial mets with radiation. -CT head obtained with hypodensity in the left occipital region could represent interval infarct or metastasis.  Recommend MRI head with contrast to further evaluate.  Grossly similar edema in the left right frontoparietal region correlates with known metastatic lesion seen on prior MRI.  No progressive mass effect. -Get MRI head for follow-up on abnormal CT. -Continue hydration with IV fluids, supportive care.  8.  Recently diagnosed metastatic lung cancer/history of breast cancer -Informed via epic of patient's admission. -Outpatient follow-up.   DVT prophylaxis: Lovenox Code Status: DNR Family Communication: Updated patient.  No family at bedside. Disposition: TBD  Status is: Observation The patient remains OBS appropriate and will d/c before 2 midnights.   Consultants:  None  Procedures: CT head 08/27/2023 Chest x-ray 08/27/2023   Antimicrobials:  Anti-infectives (From admission, onward)    Start     Dose/Rate Route Frequency Ordered Stop   08/28/23 1500  cefTRIAXone (ROCEPHIN) 1 g in sodium chloride 0.9 % 100 mL IVPB        1 g 200 mL/hr over 30 Minutes Intravenous Daily 08/28/23 1405     08/28/23 1000  hydroxychloroquine (PLAQUENIL) tablet 200 mg        200 mg Oral Daily 08/27/23 1653           Subjective: Patient laying in bed.  Denies any chest pain or shortness of breath.  Denies any abdominal pain.  Patient also with complaints of some intermittent cough.  Complaining of oral pain which she states is the reason for her poor oral intake/decreased appetite.  Objective:  Vitals:   08/28/23 0139 08/28/23 0200 08/28/23 0441 08/28/23 1235  BP: 113/68  118/66 (!) 100/59  Pulse: (!) 109 (!) 109 96 86  Resp: (!) 22 20 (!) 21 18  Temp: 98.1 F  (36.7 C)  97.9 F (36.6 C) 98.2 F (36.8 C)  TempSrc: Oral  Oral Oral  SpO2: 100%  99% 100%  Weight:      Height:        Intake/Output Summary (Last 24 hours) at 08/28/2023 1835 Last data filed at 08/28/2023 1700 Gross per 24 hour  Intake 2667.99 ml  Output --  Net 2667.99 ml   Filed Weights   08/27/23 1223  Weight: 74.4 kg    Examination:  General exam: Appears calm and comfortable  Oropharynx: Some mucositis noted. Respiratory system: Clear to auscultation. Respiratory effort normal. Cardiovascular system: S1 & S2 heard, RRR. No JVD, murmurs, rubs, gallops or clicks. No pedal edema. Gastrointestinal system: Abdomen is nondistended, soft and nontender. No organomegaly or masses felt. Normal bowel sounds heard. Central nervous system: Alert and oriented. No focal neurological deficits. Extremities: Symmetric 5 x 5 power. Skin: No rashes, lesions or ulcers Psychiatry: Judgement and insight appear normal. Mood & affect appropriate.     Data Reviewed: I have personally reviewed following labs and imaging studies  CBC: Recent Labs  Lab 08/27/23 1205 08/28/23 0508  WBC 14.6* 14.8*  NEUTROABS 12.9*  --   HGB 9.6* 8.5*  HCT 31.6* 27.9*  MCV 80.0 81.1  PLT 489* 448*    Basic Metabolic Panel: Recent Labs  Lab 08/27/23 1205 08/28/23 0508  NA 137 138  K 4.5 3.8  CL 91* 98  CO2 29 26  GLUCOSE 80 92  BUN 29* 24*  CREATININE 1.36* 1.11*  CALCIUM 9.6 9.0    GFR: Estimated Creatinine Clearance: 52.4 mL/min (A) (by C-G formula based on SCr of 1.11 mg/dL (H)).  Liver Function Tests: Recent Labs  Lab 08/27/23 1205  AST 13*  ALT 13  ALKPHOS 89  BILITOT 0.8  PROT 7.7  ALBUMIN 2.9*    CBG: Recent Labs  Lab 08/27/23 1137  GLUCAP 76     Recent Results (from the past 240 hour(s))  Resp panel by RT-PCR (RSV, Flu A&B, Covid) Anterior Nasal Swab     Status: None   Collection Time: 08/27/23 11:50 AM   Specimen: Anterior Nasal Swab  Result Value Ref  Range Status   SARS Coronavirus 2 by RT PCR NEGATIVE NEGATIVE Final    Comment: (NOTE) SARS-CoV-2 target nucleic acids are NOT DETECTED.  The SARS-CoV-2 RNA is generally detectable in upper respiratory specimens during the acute phase of infection. The lowest concentration of SARS-CoV-2 viral copies this assay can detect is 138 copies/mL. A negative result does not preclude SARS-Cov-2 infection and should not be used as the sole basis for treatment or other patient management decisions. A negative result may occur with  improper specimen collection/handling, submission of specimen other than nasopharyngeal swab, presence of viral mutation(s) within the areas targeted by this assay, and inadequate number of viral copies(<138 copies/mL). A negative result must be combined with clinical observations, patient history, and epidemiological information. The expected result is Negative.  Fact Sheet for Patients:  BloggerCourse.com  Fact Sheet for Healthcare Providers:  SeriousBroker.it  This test is no t yet approved or cleared by the [place] FDA and  has been authorized for detection and/or diagnosis of SARS-CoV-2 by FDA under an Emergency Use Authorization (EUA). This EUA will remain  in effect (meaning this test can be used) for the duration of the COVID-19 declaration under Section 564(b)(1) of the Act, 21 U.S.C.section 360bbb-3(b)(1), unless the authorization is terminated  or revoked sooner.       Influenza A by PCR NEGATIVE NEGATIVE Final   Influenza B by PCR NEGATIVE NEGATIVE Final    Comment: (NOTE) The Xpert Xpress SARS-CoV-2/FLU/RSV plus assay is intended as an aid in the diagnosis of influenza from Nasopharyngeal swab specimens and should not be used as a sole basis for treatment. Nasal washings and aspirates are unacceptable for Xpert Xpress SARS-CoV-2/FLU/RSV testing.  Fact Sheet for  Patients: BloggerCourse.com  Fact Sheet for Healthcare Providers: SeriousBroker.it  This test is not yet approved or cleared by the [place] FDA and has been authorized for detection and/or diagnosis of SARS-CoV-2 by FDA under an Emergency Use Authorization (EUA). This EUA will remain in effect (meaning this test can be used) for the duration of the COVID-19 declaration under Section 564(b)(1) of the Act, 21 U.S.C. section 360bbb-3(b)(1), unless the authorization is terminated or revoked.     Resp Syncytial Virus by PCR NEGATIVE NEGATIVE Final    Comment: (NOTE) Fact Sheet for Patients: BloggerCourse.com  Fact Sheet for Healthcare Providers: SeriousBroker.it  This test is not yet approved or cleared by the [place] FDA and has been authorized for detection and/or diagnosis of SARS-CoV-2 by FDA under an Emergency Use Authorization (EUA). This EUA will remain in effect (meaning this test can be used) for the duration of the COVID-19 declaration under Section 564(b)(1) of the Act, 21 U.S.C. section 360bbb-3(b)(1), unless the authorization is terminated or revoked.  Performed at [facility], [address] [place], [place] [address]   Group A Strep by PCR     Status: None   Collection Time: 08/27/23  2:15 PM   Specimen: Throat; Sterile Swab  Result Value Ref Range Status   Group A Strep by PCR NOT DETECTED NOT DETECTED Final    Comment: Performed at [facility], [address], [place], [place] [address]         Radiology Studies: CT Head Wo Contrast  Result Date: 08/27/2023 CLINICAL DATA:  Mental status change, unknown cause EXAM: CT HEAD WITHOUT CONTRAST TECHNIQUE: Contiguous axial images were obtained from the base of the skull through the vertex without intravenous contrast. RADIATION DOSE REDUCTION: This exam was  performed according to the departmental dose-optimization program which includes automated exposure control, adjustment of the mA and/or kV according to patient size and/or use of iterative reconstruction technique. COMPARISON:  MRI 07/20/2023. FINDINGS: Brain: Hypodensity in the left occipital region appears new. Similar edema in the right frontoparietal region. No substantial mass effect. No midline shift. No hydrocephalus. No acute hemorrhage. Vascular: No hyperdense vessel. Skull: No acute fracture. Sinuses/Orbits: Clear sinuses.  No acute orbital findings. Other: No mastoid effusions. IMPRESSION: 1. Hypodensity in the left occipital region could represent interval (since 07/20/2023) infarct or metastasis. Recommend MRI head with contrast to further evaluate. 2. Grossly similar edema in the right frontoparietal region correlates with known metastatic lesion seen on prior MRI. No progressive mass effect. Electronically Signed   By: Feliberto Harts M.D.   On: 08/27/2023 17:12   DG Chest Port 1 View  Result Date: 08/27/2023 CLINICAL DATA:  Weakness.  History of lung cancer EXAM: PORTABLE CHEST 1 VIEW COMPARISON:  X-ray 07/09/2023.  PET-CT scan 08/03/2023 FINDINGS: Volume loss of the right hemithorax with slightly increasing opacity in the  right upper lung compared to prior x-ray. Surgical clips in the right axillary region. Left lung is grossly clear. No pneumothorax, effusion or edema. Normal cardiopericardial silhouette. Interval placement of a right IJ chest port with the tip along the upper SVC. Overlapping cardiac leads. IMPRESSION: Chest port. Increasing parenchymal opacity in the right upper lung. Acute infiltrative process is possible. Please correlate for known history of a right upper lobe lung cancer. Electronically Signed   By: Karen Kays M.D.   On: 08/27/2023 14:33        Scheduled Meds:  enoxaparin (LOVENOX) injection  40 mg Subcutaneous Q24H   hydroxychloroquine  200 mg Oral Daily    magic mouthwash w/lidocaine  10 mL Oral TID   metoprolol tartrate  25 mg Oral Daily   montelukast  10 mg Oral QHS   pantoprazole  40 mg Oral Daily   sertraline  50 mg Oral Daily   Continuous Infusions:  sodium chloride 100 mL/hr at 08/28/23 1427   cefTRIAXone (ROCEPHIN)  IV 1 g (08/28/23 1745)     LOS: 0 days    Time spent: 35 minutes    Ramiro Harvest, MD [facility]   To contact the attending provider between 7A-7P or the covering provider during after hours 7P-7A, please log into the web site [email or web address] and access using universal [place] password for that web site. If you do not have the password, please call the hospital operator.  08/28/2023, 6:35 PM

## 2023-08-29 ENCOUNTER — Inpatient Hospital Stay (HOSPITAL_COMMUNITY): Payer: 59

## 2023-08-29 ENCOUNTER — Other Ambulatory Visit: Payer: 59

## 2023-08-29 ENCOUNTER — Ambulatory Visit: Payer: 59

## 2023-08-29 DIAGNOSIS — Z1152 Encounter for screening for COVID-19: Secondary | ICD-10-CM | POA: Diagnosis not present

## 2023-08-29 DIAGNOSIS — D869 Sarcoidosis, unspecified: Secondary | ICD-10-CM

## 2023-08-29 DIAGNOSIS — J9612 Chronic respiratory failure with hypercapnia: Secondary | ICD-10-CM

## 2023-08-29 DIAGNOSIS — I159 Secondary hypertension, unspecified: Secondary | ICD-10-CM | POA: Diagnosis not present

## 2023-08-29 DIAGNOSIS — K1230 Oral mucositis (ulcerative), unspecified: Secondary | ICD-10-CM | POA: Diagnosis present

## 2023-08-29 DIAGNOSIS — E875 Hyperkalemia: Secondary | ICD-10-CM | POA: Diagnosis present

## 2023-08-29 DIAGNOSIS — N179 Acute kidney failure, unspecified: Secondary | ICD-10-CM | POA: Diagnosis present

## 2023-08-29 DIAGNOSIS — E860 Dehydration: Secondary | ICD-10-CM | POA: Diagnosis present

## 2023-08-29 DIAGNOSIS — G9341 Metabolic encephalopathy: Secondary | ICD-10-CM | POA: Diagnosis present

## 2023-08-29 DIAGNOSIS — J309 Allergic rhinitis, unspecified: Secondary | ICD-10-CM | POA: Diagnosis present

## 2023-08-29 DIAGNOSIS — Z66 Do not resuscitate: Secondary | ICD-10-CM | POA: Diagnosis present

## 2023-08-29 DIAGNOSIS — J440 Chronic obstructive pulmonary disease with acute lower respiratory infection: Secondary | ICD-10-CM | POA: Diagnosis present

## 2023-08-29 DIAGNOSIS — G936 Cerebral edema: Secondary | ICD-10-CM | POA: Diagnosis present

## 2023-08-29 DIAGNOSIS — C3411 Malignant neoplasm of upper lobe, right bronchus or lung: Secondary | ICD-10-CM | POA: Diagnosis present

## 2023-08-29 DIAGNOSIS — D630 Anemia in neoplastic disease: Secondary | ICD-10-CM | POA: Diagnosis present

## 2023-08-29 DIAGNOSIS — J9611 Chronic respiratory failure with hypoxia: Secondary | ICD-10-CM

## 2023-08-29 DIAGNOSIS — E669 Obesity, unspecified: Secondary | ICD-10-CM | POA: Diagnosis present

## 2023-08-29 DIAGNOSIS — J189 Pneumonia, unspecified organism: Secondary | ICD-10-CM | POA: Diagnosis present

## 2023-08-29 DIAGNOSIS — R627 Adult failure to thrive: Secondary | ICD-10-CM | POA: Diagnosis present

## 2023-08-29 DIAGNOSIS — K219 Gastro-esophageal reflux disease without esophagitis: Secondary | ICD-10-CM | POA: Diagnosis present

## 2023-08-29 DIAGNOSIS — C7931 Secondary malignant neoplasm of brain: Secondary | ICD-10-CM | POA: Diagnosis present

## 2023-08-29 DIAGNOSIS — J449 Chronic obstructive pulmonary disease, unspecified: Secondary | ICD-10-CM

## 2023-08-29 DIAGNOSIS — I10 Essential (primary) hypertension: Secondary | ICD-10-CM | POA: Diagnosis present

## 2023-08-29 DIAGNOSIS — F32A Depression, unspecified: Secondary | ICD-10-CM | POA: Diagnosis present

## 2023-08-29 DIAGNOSIS — C7951 Secondary malignant neoplasm of bone: Secondary | ICD-10-CM | POA: Diagnosis present

## 2023-08-29 DIAGNOSIS — D573 Sickle-cell trait: Secondary | ICD-10-CM | POA: Diagnosis present

## 2023-08-29 LAB — BASIC METABOLIC PANEL
Anion gap: 11 (ref 5–15)
BUN: 20 mg/dL (ref 8–23)
CO2: 28 mmol/L (ref 22–32)
Calcium: 8.7 mg/dL — ABNORMAL LOW (ref 8.9–10.3)
Chloride: 101 mmol/L (ref 98–111)
Creatinine, Ser: 1.02 mg/dL — ABNORMAL HIGH (ref 0.44–1.00)
GFR, Estimated: >60 mL/min (ref >60)
Glucose, Bld: 103 mg/dL — ABNORMAL HIGH (ref 70–99)
Potassium: 3.5 mmol/L (ref 3.5–5.1)
Sodium: 140 mmol/L (ref 135–145)

## 2023-08-29 LAB — EXPECTORATED SPUTUM ASSESSMENT W GRAM STAIN, RFLX TO RESP C

## 2023-08-29 LAB — CBC WITH DIFFERENTIAL/PLATELET
Abs Immature Granulocytes: 0.15 10*3/uL — ABNORMAL HIGH (ref 0.00–0.07)
Basophils Absolute: 0 10*3/uL (ref 0.0–0.1)
Basophils Relative: 0 %
Eosinophils Absolute: 0.3 10*3/uL (ref 0.0–0.5)
Eosinophils Relative: 2 %
HCT: 26.4 % — ABNORMAL LOW (ref 36.0–46.0)
Hemoglobin: 8 g/dL — ABNORMAL LOW (ref 12.0–15.0)
Immature Granulocytes: 1 %
Lymphocytes Relative: 3 %
Lymphs Abs: 0.4 10*3/uL — ABNORMAL LOW (ref 0.7–4.0)
MCH: 24.8 pg — ABNORMAL LOW (ref 26.0–34.0)
MCHC: 30.3 g/dL (ref 30.0–36.0)
MCV: 81.7 fL (ref 80.0–100.0)
Monocytes Absolute: 1.1 10*3/uL — ABNORMAL HIGH (ref 0.1–1.0)
Monocytes Relative: 8 %
Neutro Abs: 11.4 10*3/uL — ABNORMAL HIGH (ref 1.7–7.7)
Neutrophils Relative %: 86 %
Platelets: 417 10*3/uL — ABNORMAL HIGH (ref 150–400)
RBC: 3.23 MIL/uL — ABNORMAL LOW (ref 3.87–5.11)
RDW: 16.5 % — ABNORMAL HIGH (ref 11.5–15.5)
WBC: 13.3 10*3/uL — ABNORMAL HIGH (ref 4.0–10.5)
nRBC: 0 % (ref 0.0–0.2)

## 2023-08-29 MED ORDER — SODIUM CHLORIDE 0.9 % IV SOLN
500.0000 mg | INTRAVENOUS | Status: DC
  Administered 2023-08-29 – 2023-08-31 (×3): 500 mg via INTRAVENOUS
  Filled 2023-08-29 (×3): qty 5

## 2023-08-29 MED ORDER — POTASSIUM CHLORIDE IN NACL 20-0.9 MEQ/L-% IV SOLN
INTRAVENOUS | Status: AC
  Filled 2023-08-29 (×3): qty 1000

## 2023-08-29 MED ORDER — REVEFENACIN 175 MCG/3ML IN SOLN
175.0000 ug | Freq: Every day | RESPIRATORY_TRACT | Status: DC
  Administered 2023-08-29 – 2023-09-01 (×4): 175 ug via RESPIRATORY_TRACT
  Filled 2023-08-29 (×4): qty 3

## 2023-08-29 MED ORDER — GADOBUTROL 1 MMOL/ML IV SOLN
7.5000 mL | Freq: Once | INTRAVENOUS | Status: AC | PRN
  Administered 2023-08-29: 7.5 mL via INTRAVENOUS

## 2023-08-29 MED ORDER — ARFORMOTEROL TARTRATE 15 MCG/2ML IN NEBU
15.0000 ug | INHALATION_SOLUTION | Freq: Two times a day (BID) | RESPIRATORY_TRACT | Status: DC
  Administered 2023-08-29 – 2023-09-01 (×6): 15 ug via RESPIRATORY_TRACT
  Filled 2023-08-29 (×6): qty 2

## 2023-08-29 MED ORDER — SODIUM CHLORIDE 0.9% FLUSH
3.0000 mL | Freq: Two times a day (BID) | INTRAVENOUS | Status: DC
  Administered 2023-08-29 – 2023-09-01 (×4): 3 mL via INTRAVENOUS

## 2023-08-29 NOTE — Radiation Completion Notes (Addendum)
  Radiation Oncology         [phone] ________________________________  Name: [name] MRN: [id number]  Date: 08/28/2023  DOB: 1961-07-29  Referring Physician: Julio Sicks, M.D. Date of Service: 2023-08-29 Radiation Oncologist: Margaretmary Bayley, M.D. [facility]     RADIATION ONCOLOGY END OF TREATMENT NOTE     Diagnosis: 62 y.o. 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   Intent: Palliative     ==========DELIVERED PLANS==========  First Treatment Date: 2023-08-13 - Last Treatment Date: 2023-08-28   Plan Name: Lung_R_UHRT Site: Lung, Right Technique: IMRT Mode: Photon Dose Per Fraction: 7.5 Gy Prescribed Dose (Delivered / Prescribed): 60 Gy / 60 Gy Prescribed Fxs (Delivered / Prescribed): 8 / 8   Plan Name: Spine_T_SBRT Site: Thoracic Spine Technique: SBRT/SRT-IMRT Mode: Photon Dose Per Fraction: 10 Gy Prescribed Dose (Delivered / Prescribed): 50 Gy / 50 Gy Prescribed Fxs (Delivered / Prescribed): 5 / 5     ==========ON TREATMENT VISIT DATES========== 2023-08-13, 2023-08-15, 2023-08-17, 2023-08-17, 2023-08-22, 2023-08-24, 2023-08-24    See weekly On Treatment Notes in Epic for details.  She tolerated the radiation treatments well without any ill side effects.  The patient will receive a call in about one month from the radiation oncology department. She will continue follow up with Dr. Arbutus Ped as well.  ------------------------------------------------   Margaretmary Dys, MD [facility]  Radiation Oncology Direct Dial: [phone]  Fax: [phone] [email or web address]  Skype  LinkedIn

## 2023-08-29 NOTE — Assessment & Plan Note (Signed)
-  Continue home Plaquenil

## 2023-08-29 NOTE — Assessment & Plan Note (Signed)
Blood pressure soft - Continue metoprolol - Hold triamterene-HCTZ

## 2023-08-29 NOTE — TOC Progression Note (Addendum)
Transition of Care [facility]) - Progression Note    Patient Details  Name: [name] MRN: [id number] Date of Birth: Aug 23, 1961  Transition of Care [facility]) CM/SW Contact  Howell Rucks, RN Phone Number: 08/29/2023, 2:44 PM  Clinical Narrative: Met with pt at bedside to review dc planning, PT recommendation for [facility] PT, pt agreeable, no preference. NCM will assist in arranging HH. Teams chat sent to attending requesting HH order.      Barriers to Discharge: Continued Medical Work up  Expected Discharge Plan and Services       Living arrangements for the past 2 months: Apartment                                       Social Determinants of Health (SDOH) Interventions SDOH Screenings   Food Insecurity: No Food Insecurity (08/27/2023)  Housing: Patient Declined (08/27/2023)  Transportation Needs: No Transportation Needs (08/27/2023)  Utilities: Not At Risk (08/27/2023)  Alcohol Screen: Low Risk  (07/23/2023)  Depression (PHQ2-9): Low Risk  (07/23/2023)  Tobacco Use: Medium Risk (08/27/2023)    Readmission Risk Interventions     No data to display

## 2023-08-29 NOTE — Assessment & Plan Note (Signed)
Continue home O2.

## 2023-08-29 NOTE — Assessment & Plan Note (Addendum)
No wheezing on exam - Continue home LAMA/LABA, Singulair

## 2023-08-29 NOTE — Assessment & Plan Note (Signed)
Cerebral edema MRI brain shows new brain metastases with vasogenic edema.  I suspect this is responsible for her weakness, poor PO intake.  Discussed with Rad Onc, Med Oncc, they recommend outpatient follow up after stabilization - Start dexamethasone 4 mg BID

## 2023-08-29 NOTE — Hospital Course (Addendum)
[name] is a 62 y.o. 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    In the ER, creatinine elevated to 1.4, CXR showed worsening RUL infiltrate.  CT head showed new occipital lucency.  Admitted on fluids, antibiotics.

## 2023-08-29 NOTE — Assessment & Plan Note (Signed)
Worsening infiltrates, cough, leukocytosis, sputum. - Start azithromycin - Continue Rocephin - Obtain sputum culture

## 2023-08-29 NOTE — Progress Notes (Signed)
Progress Note   Patient: [name] [id number]:[id number] DOB: [birth year] DOA: 08/27/2023     0 DOS: the patient was seen and examined on 08/29/2023 at 9:23AM      Brief hospital course: Mrs. Keniston is a 62 y.o. 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    In the ER, creatinine elevated to 1.4, CXR showed worsening RUL infiltrate.  CT head showed new occipital lucency.  Admitted on fluids, antibiotics.     Assessment and Plan: * Community acquired pneumonia of right upper lobe of lung Worsening infiltrates, cough, leukocytosis, sputum. - Start azithromycin - Continue Rocephin - Obtain sputum culture    Chronic respiratory failure with hypoxia and hypercapnia (HCC) - Continue home O2  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  No wheezing on exam - Continue home LAMA/LABA, Singulair  Hypertension Blood pressure soft - Continue metoprolol - Hold triamterene-HCTZ  Sarcoidosis (HCC) - Continue home Plaquenil  AKI (acute kidney injury) (HCC) Cr 1.4 on admission, slightly better with fluids.  Not taking PO well - Continue IV fluids  Confusion Reportedly intermittent.  Seems oriented mostly but some obvious psychomotor slowing - MRI brain with and without - Standard delirium precautions: blinds open and lights on during day, TV off, minimize interruptions at night, glasses/hearing aids, PT/OT, avoiding Beers list medications     Odynophagia    Lung cancer metastatic to brain [facility]) CT head implies a new mass.  Unclear if she is having edema and encephalopathy from this. - Obtain MRI brain - Patient not taking dexamethasone at present, unclear from Rad Onc notes if that was intended or not  Squamous cell lung cancer, right  (HCC)    Depression - Continue sertraline          Subjective: Still coughing, still weak, not eating, no appetite.  No seizures, no frank confusion, not very attentive.     Physical Exam: BP 112/70 (BP Location: Right Arm)   Pulse (!) 103   Temp 99 F (37.2 C) (Oral)   Resp 16   Ht 5\' 5"  (1.651 m)   Wt 74.4 kg   SpO2 100%   BMI 27.29 kg/m   Elderly adult female, lying in bed, very tired and weak, responds to questions Tachycardic, regular, no murmurs, no LE edema Respiratory rate seems increased, out of breath with bed mobility, Lung sounds equal but diminished, no wheezing, coughing frequently Abdomen soft, no TTP, no ascites Attention diminished, psycomotor slowing noted, affect blunted, tired, upper extremity strength weakn but symmetric, oriented to person, place and time      Data Reviewed: Basic metabolic panel shows improved creatinine, sodium and potassium normal CBC shows slightly worse hemoglobin    Family Communication: None present    Disposition: The patient has pneumonia with PSI 108, risk class IV.  In addition she has ongoing tachycardia, tachypnea and should continue IV antibiotics and IV fluids         Author: Alberteen Sam, MD 08/29/2023 2:29 PM  For on call review [email or web address].

## 2023-08-29 NOTE — Progress Notes (Signed)
Physical Therapy Treatment Patient Details Name: [name] MRN: [id number] DOB: 1961-11-12 Today's Date: 08/29/2023   History of Present Illness 62 yo female admitted with AKI/acute renal failure. Hx of COPD-O2 dep at baseline, sarcoidosis, breast Ca, lumpectomy, met lung Ca, chronic resp failure    PT Comments  Pt agreeable to working with therapy. She appears weak and fatigues easily with minimal activity. Remained on [place] O2-2L-sats 91%. Pt did not feel able to ambulate any further. Assisted pt back to bed at her request. Will continue to follow and progress activity as tolerated.    If plan is discharge home, recommend the following: A little help with walking and/or transfers;A little help with bathing/dressing/bathroom;Assistance with cooking/housework;Assist for transportation;Help with stairs or ramp for entrance   Can travel by private vehicle        Equipment Recommendations  Rolling walker (2 wheels)    Recommendations for Other Services       Precautions / Restrictions Precautions Precautions: Fall Precaution Comments: O2 dep at baseline Restrictions Weight Bearing Restrictions: No     Mobility  Bed Mobility Overal bed mobility: Modified Independent             General bed mobility comments: increased time. rest break needed before standing    Transfers Overall transfer level: Needs assistance Equipment used: Rolling walker (2 wheels) Transfers: Sit to/from Stand Sit to Stand: Contact guard assist           General transfer comment: Unsteady. Increased time.    Ambulation/Gait Ambulation/Gait assistance: Contact guard assist Gait Distance (Feet): 15 Feet (x2) Assistive device: None Gait Pattern/deviations: Step-through pattern, Decreased stride length       General Gait Details: Gait appeared mildly antalgic-pt reports R knee/LE discomfort still present but better than last session. "Furniture cruising" to/from bathroom. Unsteady. Remained  on [place] O2-91%. Fatigues easily   Comptroller Bed    Modified Rankin (Stroke Patients Only)       Balance                                            Cognition Arousal: Alert Behavior During Therapy: Flat affect Overall Cognitive Status: Within Functional Limits for tasks assessed                                          Exercises      General Comments        Pertinent Vitals/Pain Pain Assessment Faces Pain Scale: Hurts a little bit Pain Location: knee Pain Descriptors / Indicators: Discomfort, Sore Pain Intervention(s): Limited activity within patient's tolerance, Monitored during session, Repositioned    Home Living                          Prior Function            PT Goals (current goals can now be found in the care plan section) Progress towards PT goals: Progressing toward goals    Frequency    Min 1X/week      PT Plan      Co-evaluation  AM-PAC PT "6 Clicks" Mobility   Outcome Measure  Help needed turning from your back to your side while in a flat bed without using bedrails?: None Help needed moving from lying on your back to sitting on the side of a flat bed without using bedrails?: None Help needed moving to and from a bed to a chair (including a wheelchair)?: A Little Help needed standing up from a chair using your arms (e.g., wheelchair or bedside chair)?: A Little Help needed to walk in hospital room?: A Little Help needed climbing 3-5 steps with a railing? : A Lot 6 Click Score: 19    End of Session   Activity Tolerance: Patient limited by fatigue Patient left: in bed;with call bell/phone within reach;with bed alarm set   PT Visit Diagnosis: Muscle weakness (generalized) (M62.81);Difficulty in walking, not elsewhere classified (R26.2)     Time: 4010-2725 PT Time Calculation (min) (ACUTE ONLY): 22 min  Charges:     $Gait Training: 8-22 mins PT General Charges $$ ACUTE PT VISIT: 1 Visit              Faye Ramsay, PT Acute Rehabilitation  Office: [phone]

## 2023-08-29 NOTE — Assessment & Plan Note (Signed)
Cr 1.4 on admission, slightly better with fluids.  Still not taking PO well - Continue IV fluids

## 2023-08-29 NOTE — Assessment & Plan Note (Addendum)
Reportedly intermittent.  Seems oriented mostly but some obvious psychomotor slowing - MRI brain with and without - Standard delirium precautions: blinds open and lights on during day, TV off, minimize interruptions at night, glasses/hearing aids, PT/OT, avoiding Beers list medications

## 2023-08-29 NOTE — Assessment & Plan Note (Signed)
Continue sertraline

## 2023-08-30 DIAGNOSIS — J189 Pneumonia, unspecified organism: Secondary | ICD-10-CM | POA: Diagnosis not present

## 2023-08-30 DIAGNOSIS — J9611 Chronic respiratory failure with hypoxia: Secondary | ICD-10-CM | POA: Diagnosis not present

## 2023-08-30 DIAGNOSIS — I159 Secondary hypertension, unspecified: Secondary | ICD-10-CM | POA: Diagnosis not present

## 2023-08-30 DIAGNOSIS — J449 Chronic obstructive pulmonary disease, unspecified: Secondary | ICD-10-CM | POA: Diagnosis not present

## 2023-08-30 LAB — CBC
HCT: 23.8 % — ABNORMAL LOW (ref 36.0–46.0)
Hemoglobin: 7.2 g/dL — ABNORMAL LOW (ref 12.0–15.0)
MCH: 24.7 pg — ABNORMAL LOW (ref 26.0–34.0)
MCHC: 30.3 g/dL (ref 30.0–36.0)
MCV: 81.8 fL (ref 80.0–100.0)
Platelets: 337 10*3/uL (ref 150–400)
RBC: 2.91 MIL/uL — ABNORMAL LOW (ref 3.87–5.11)
RDW: 17.1 % — ABNORMAL HIGH (ref 11.5–15.5)
WBC: 11.2 10*3/uL — ABNORMAL HIGH (ref 4.0–10.5)
nRBC: 0 % (ref 0.0–0.2)

## 2023-08-30 LAB — COMPREHENSIVE METABOLIC PANEL
ALT: 9 U/L (ref 0–44)
AST: 10 U/L — ABNORMAL LOW (ref 15–41)
Albumin: 2 g/dL — ABNORMAL LOW (ref 3.5–5.0)
Alkaline Phosphatase: 50 U/L (ref 38–126)
Anion gap: 12 (ref 5–15)
BUN: 11 mg/dL (ref 8–23)
CO2: 26 mmol/L (ref 22–32)
Calcium: 8.5 mg/dL — ABNORMAL LOW (ref 8.9–10.3)
Chloride: 105 mmol/L (ref 98–111)
Creatinine, Ser: 0.76 mg/dL (ref 0.44–1.00)
GFR, Estimated: >60 mL/min (ref >60)
Glucose, Bld: 77 mg/dL (ref 70–99)
Potassium: 3.8 mmol/L (ref 3.5–5.1)
Sodium: 143 mmol/L (ref 135–145)
Total Bilirubin: 0.8 mg/dL (ref 0.3–1.2)
Total Protein: 5.5 g/dL — ABNORMAL LOW (ref 6.5–8.1)

## 2023-08-30 LAB — URINE CULTURE: Culture: 30000 — AB

## 2023-08-30 MED ORDER — POTASSIUM CHLORIDE IN NACL 20-0.9 MEQ/L-% IV SOLN
INTRAVENOUS | Status: DC
  Filled 2023-08-30 (×2): qty 1000

## 2023-08-30 MED ORDER — DEXAMETHASONE 4 MG PO TABS
4.0000 mg | ORAL_TABLET | Freq: Two times a day (BID) | ORAL | Status: DC
  Administered 2023-08-30 – 2023-09-01 (×5): 4 mg via ORAL
  Filled 2023-08-30 (×5): qty 1

## 2023-08-30 NOTE — Progress Notes (Signed)
Radiation Oncology         [phone] ________________________________  Name: [name] MRN: [id number]  Date: 08/27/2023  DOB: August 20, 1961  INPATIENT  VIRTUAL SIMULATION AND TREATMENT PLANNING NOTE    ICD-10-CM   1. AKI (acute kidney injury) (HCC)  N17.9     2. 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  Identity was confirmed.  All relevant records and images related to the planned course of therapy were reviewed.  The patient will provide informed written consent to proceed with treatment after reviewing the details related to the planned course of therapy tomorrow. The consent form is to be witnessed and verified by the simulation staff. Intravenous access was established for contrast administration. Then, the patient was set-up in a stable reproducible supine position for radiation therapy.  Her previous relocatable thermoplastic stereotactic head frame was positioned for precise immobilization.  CT images were fused without a new CT study.  Surface markings were placed.  The images were loaded into the planning software and fused with the patient's targeting MRI scan.  Then the target and avoidance structures were contoured.  Treatment planning then occurred.  The radiation prescription was entered and confirmed.  I have requested 3D planning  I have requested a DVH of the following structures: Brain stem, brain, left eye, right eye, lenses, optic chiasm, target volumes, uninvolved brain, and normal tissue.    SPECIAL TREATMENT PROCEDURE:  The planned course of therapy using radiation constitutes a special treatment procedure. Special care is required in the management of this patient for the following reasons. This treatment constitutes a Special  Treatment Procedure for the following reason: High dose per fraction requiring special monitoring for increased toxicities of treatment including daily imaging.  The special nature of the planned course of radiotherapy will require increased physician supervision and oversight to ensure patient's safety with optimal treatment outcomes.  This requires extended time and effort.  PLAN:  The patient will receive 20 Gy in 1 fraction to each of the new lesions.  ________________________________  Artist Pais [name], M.D.      REFERENCE:  J Appl Clin Med Phys. 2017 Sep;18(5):251-258. doi: [email or web address]/[email or web address]. Epub 2017 Aug 3.  Do we need a new CT scan for retreatment of intracranial SRS patients?  Adah Perl 1, Margaretmary Dys 1, Susie Cassette 1, Martyn Ehrich 1, Coralie [place] 1, [place] 1, [place] 1, Janeece Fitting 1  [employer] PMID: [id number] PMCID: [id number] DOI: [email or web address]/[email or web address]  Abstract Purpose: To determine if the treatment planning computed tomography scan (CT) from an initial intracranial stereotactic radiosurgery [facility]) treatment can be used for repeat courses of SRS.  Methods and materials: Twenty-five patients with 40 brain metastases that received multiple courses of SRS were retrospectively studied. Magnetic resonance scans from repeat SRS (rMR) courses were registered to CT scans from the initial SRS (iCT) and repeat SRS (rCT). The CT scans were then registered to find the displacement of the rMR between iCT and rCT registrations. The distance from each target to proximal skull surface was measured in 16 directions on each CT scan after registration. The mutual information (MI) coefficients from the registration process were used to evaluate image set similarity. Targets and plans from the rCTs were transferred to the iCTs, and doses were  recalculated on the iCT for repeat plans. The two dose distributions were compared through 3D gamma analysis.  Results: The  magnitude of the mean linear translations from the MR registrations was 0.6  0.3 mm. The mean differences in distance from target to skull on a per target basis were 0.3  0.2 mm. The MI was 0.582  0.042. Registration between a comparison group of 30 CT scans that had the same data resampled and 30 scans that were intercompared with different patients gave MI = 0.721  0.055 and MI = 0.359  0.031, respectively. The mean gamma passing rates were 0.997  0.007 for 1 mm/1% criteria.  Conclusions: The rMR can be aligned to the iCT to accurately define targets. The skull shows minimal change between scans so the iCT can be used for set-up at repeat treatments. The dosimetry provided by the iCT dose calculation is adequate for repeat SRS. Treatment based on iCT is feasible.  Keywords: SRS; repeat CT; salvage; treatment planning.   2017 The Authors. Journal of Applied Clinical Medical Physics published by [employer]. on behalf of American Association of Physicists in Medicine.  JournalStream.pl.pdf

## 2023-08-30 NOTE — TOC Progression Note (Addendum)
Transition of Care [facility]) - Progression Note    Patient Details  Name: GRACEANNA THEISSEN MRN: [id number] Date of Birth: October 26, 1961  Transition of Care [facility]) CM/SW Contact  Howell Rucks, RN Phone Number: 08/30/2023, 9:34 AM  Clinical Narrative:   Cindie Laroche HH rep-Angela, accepted for [facility], request sent to attending to enter [facility] PT order and place face to face, added to AVS.   -2:14pm teams chat from attending " can't order the f14f until she's ready to go ". TOC will continue to follow for [facility] order   -2:28pm Order entered for [facility] PT/OT, need face to face entered prior to dc.     Barriers to Discharge: Continued Medical Work up  Expected Discharge Plan and Services       Living arrangements for the past 2 months: Apartment                                       Social Determinants of Health (SDOH) Interventions SDOH Screenings   Food Insecurity: No Food Insecurity (08/27/2023)  Housing: Patient Declined (08/27/2023)  Transportation Needs: No Transportation Needs (08/27/2023)  Utilities: Not At Risk (08/27/2023)  Alcohol Screen: Low Risk  (07/23/2023)  Depression (PHQ2-9): Low Risk  (07/23/2023)  Tobacco Use: Medium Risk (08/27/2023)    Readmission Risk Interventions     No data to display

## 2023-08-30 NOTE — Plan of Care (Signed)
Problem: Elimination: Goal: Will not experience complications related to urinary retention Outcome: Progressing   Problem: Pain Managment: Goal: General experience of comfort will improve Outcome: Progressing   Problem: Safety: Goal: Ability to remain free from injury will improve Outcome: Progressing

## 2023-08-30 NOTE — Progress Notes (Signed)
Progress Note   Patient: [name] JWJ:[id number] DOB: 1961/08/15 DOA: 08/27/2023     1 DOS: the patient was seen and examined on 08/30/2023 at 9:25 AM      Brief hospital course: [name] is a 62 y.o. 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    In the ER, creatinine elevated to 1.4, CXR showed worsening RUL infiltrate.  CT head showed new occipital lucency.  Admitted on fluids, antibiotics.     Assessment and Plan: * Community acquired pneumonia of right upper lobe of lung Worsening infiltrates, cough, leukocytosis, sputum. - Continue Rocephin and azithromycin - Sputum culture ordered, I have asked nursing a second time to collect    Chronic respiratory failure with hypoxia and hypercapnia (HCC) - Continue home O2  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  No wheezing on exam - Continue home LAMA/LABA, Singulair  Hypertension Blood pressure normal off meds - Continue metoprolol - Hold triamterene-HCTZ  Sarcoidosis (HCC) - Continue home Plaquenil  AKI (acute kidney injury) (HCC) Cr 1.4 on admission, slightly better with fluids.  Still not taking PO well - Continue IV fluids  Acute metabolic encephalopathy Remains sleepy and intermittently inattentive.   Likely due to new mets. - Start dexamethasone - Continue IV fluids   Lung cancer metastatic to brain [facility]) Cerebral edema MRI brain shows new brain metastases with vasogenic edema.  I suspect this is responsible for her weakness, poor PO intake.  Discussed with Rad Onc, Med Oncc, they recommend outpatient follow up after stabilization - Start dexamethasone 4 mg BID    Squamous cell lung cancer, right (HCC)    Depression - Continue  sertraline          Subjective: Patient's oral intake is still very poor.  She has no appetite.  She has severe generalized weakness.  She is very sleepy.  No other nursing concerns, no fever, no respiratory distress.     Physical Exam: BP 107/66 (BP Location: Left Arm)   Pulse 89   Temp 98.5 F (36.9 C) (Oral)   Resp 16   Ht 5\' 5"  (1.651 m)   Wt 74.4 kg   SpO2 96%   BMI 27.29 kg/m   Elderly adult female, somnolent, sitting up in bed, interactive, tray untouched in front of her RRR, no murmurs, no peripheral edema Respiratory rate normal, lung sounds diminished throughout, no rales or wheezes Abdomen soft without grimace to palpation Attention diminished, psychomotor slowing noted, affect constrained, judgment and insight appear impaired, severe generalized weakness    Data Reviewed: Discussed with oncology and radiation oncology Comprehensive metabolic panel shows hypoalbuminemia CBC shows leukocytosis improving, hemoglobin down to 7.2 MRI brain with and without contrast shows 2 new brain mets in the temporal and occipital lobes with vasogenic edema    Family Communication: None    Disposition: Status is: Inpatient         Author: Alberteen Sam, MD 08/30/2023 2:32 PM  For on call review [email or web address].

## 2023-08-30 NOTE — Plan of Care (Signed)
Problem: Education: Goal: Knowledge of General Education information will improve Description: Including pain rating scale, medication(s)/side effects and non-pharmacologic comfort measures Outcome: Progressing   Problem: Clinical Measurements: Goal: Cardiovascular complication will be avoided Outcome: Progressing   Problem: Nutrition: Goal: Adequate nutrition will be maintained Outcome: Progressing   Problem: Coping: Goal: Level of anxiety will decrease Outcome: Progressing   Problem: Elimination: Goal: Will not experience complications related to urinary retention Outcome: Progressing   Problem: Safety: Goal: Ability to remain free from injury will improve Outcome: Progressing

## 2023-08-31 ENCOUNTER — Ambulatory Visit
Admission: RE | Admit: 2023-08-31 | Discharge: 2023-08-31 | Disposition: A | Discharge status: Home / Self Care | Payer: 59 | Source: Ambulatory Visit | Attending: Radiation Oncology | Admitting: Radiation Oncology

## 2023-08-31 DIAGNOSIS — J9611 Chronic respiratory failure with hypoxia: Secondary | ICD-10-CM | POA: Diagnosis not present

## 2023-08-31 DIAGNOSIS — J449 Chronic obstructive pulmonary disease, unspecified: Secondary | ICD-10-CM | POA: Diagnosis not present

## 2023-08-31 DIAGNOSIS — J189 Pneumonia, unspecified organism: Secondary | ICD-10-CM | POA: Diagnosis not present

## 2023-08-31 DIAGNOSIS — G936 Cerebral edema: Secondary | ICD-10-CM | POA: Insufficient documentation

## 2023-08-31 DIAGNOSIS — I159 Secondary hypertension, unspecified: Secondary | ICD-10-CM | POA: Diagnosis not present

## 2023-08-31 DIAGNOSIS — C3490 Malignant neoplasm of unspecified part of unspecified bronchus or lung: Secondary | ICD-10-CM

## 2023-08-31 LAB — BASIC METABOLIC PANEL
Anion gap: 8 (ref 5–15)
BUN: 14 mg/dL (ref 8–23)
CO2: 25 mmol/L (ref 22–32)
Calcium: 8.2 mg/dL — ABNORMAL LOW (ref 8.9–10.3)
Chloride: 108 mmol/L (ref 98–111)
Creatinine, Ser: 0.91 mg/dL (ref 0.44–1.00)
GFR, Estimated: >60 mL/min (ref >60)
Glucose, Bld: 162 mg/dL — ABNORMAL HIGH (ref 70–99)
Potassium: 5.2 mmol/L — ABNORMAL HIGH (ref 3.5–5.1)
Sodium: 141 mmol/L (ref 135–145)

## 2023-08-31 LAB — CBC
HCT: 24.8 % — ABNORMAL LOW (ref 36.0–46.0)
Hemoglobin: 7.4 g/dL — ABNORMAL LOW (ref 12.0–15.0)
MCH: 24.8 pg — ABNORMAL LOW (ref 26.0–34.0)
MCHC: 29.8 g/dL — ABNORMAL LOW (ref 30.0–36.0)
MCV: 83.2 fL (ref 80.0–100.0)
Platelets: 378 10*3/uL (ref 150–400)
RBC: 2.98 MIL/uL — ABNORMAL LOW (ref 3.87–5.11)
RDW: 17.1 % — ABNORMAL HIGH (ref 11.5–15.5)
WBC: 12.1 10*3/uL — ABNORMAL HIGH (ref 4.0–10.5)
nRBC: 0 % (ref 0.0–0.2)

## 2023-08-31 MED ORDER — SODIUM CHLORIDE 0.9% FLUSH
3.0000 mL | Freq: Two times a day (BID) | INTRAVENOUS | Status: DC
  Administered 2023-08-31 – 2023-09-01 (×3): 3 mL via INTRAVENOUS

## 2023-08-31 MED ORDER — LEVOFLOXACIN 500 MG PO TABS
750.0000 mg | ORAL_TABLET | Freq: Every day | ORAL | Status: AC
  Administered 2023-09-01: 750 mg via ORAL
  Filled 2023-08-31: qty 2

## 2023-08-31 NOTE — Plan of Care (Signed)

## 2023-08-31 NOTE — Progress Notes (Signed)
Progress Note   Patient: [name] ZOX:[id number] DOB: April 18, 1961 DOA: 08/27/2023     2 DOS: the patient was seen and examined on 08/31/2023 at 11:35 AM      Brief hospital course: [name] is a 61 y.o. F with COPD, sarcoid on Plaquenil, chronic respiratory failure on 2L, HTN, BrCA s/p lumpectomy XRT and recently diagnosed squamous cell lung CA metastatic to brain who presented with encephalopathy, poor p.o. intake    Assessment and Plan: * Acute metabolic encephalopathy Alert and back to mental baseline - Continue dexamethasone  Community acquired pneumonia of right upper lobe of lung -Continue Rocephin and azithromycin, day 4 of 5  Cerebral edema (HCC) Lung cancer metastatic to brain (HCC) - Dexamethasone 4 mg twice daily  Chronic respiratory failure with hypoxia and hypercapnia (HCC) - Continue home O2  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  No wheezing on exam - Continue home LAMA/LABA, Singulair  Hypertension Blood pressure normal off meds - Continue metoprolol - Hold triamterene-HCTZ  Sarcoidosis (HCC) - Continue home Plaquenil  Hyperkalemia - Stop K-containing IV fluids        Subjective: Patient is feeling somewhat better today, but still quite weak and poor oral intake, no nursing concerns, no fever.     Physical Exam: BP 107/75 (BP Location: Left Arm)   Pulse 81   Temp 98 F (36.7 C) (Oral)   Resp 16   Ht 5\' 5"  (1.651 m)   Wt 74.4 kg   SpO2 100%   BMI 27.29 kg/m   Elderly adult female, sitting up on the edge of the bed, interactive RRR, no murmurs, no peripheral edema Respiratory normal, lung sounds diminished but no wheeze or wheezes Abdomen soft no grimace to palpation Attention normal, psychomotor slowing resolved, affect blunted but close to normal Judgment and insight appear normal  Data Reviewed: Hemoglobin up to 7.4, white blood cell count 12 Potassium 5.2 Creatinine 0.9  Family  Communication: None    Disposition: Status is: Inpatient Improving, will observe oral intake overnight and d/c tomorrow if okay        Author: Alberteen Sam, MD 08/31/2023 1:53 PM  For on call review [email or web address].

## 2023-08-31 NOTE — Assessment & Plan Note (Signed)
Dexamethasone

## 2023-08-31 NOTE — Plan of Care (Signed)
[facility] Tonsillectomy/Adenoidectomy, Postoperative PEDS care plan entered in error.

## 2023-08-31 NOTE — Progress Notes (Signed)
Physical Therapy Treatment Patient Details Name: [name] MRN: [id number] DOB: Feb 28, 1961 Today's Date: 08/31/2023   History of Present Illness 62 yo female admitted with AKI/acute renal failure. Hx of COPD-O2 dep at baseline, sarcoidosis, breast Ca, lumpectomy, met lung Ca, chronic resp failure    PT Comments  Pt seen for PT tx with pt agreeable to tx. Pt ambulates in room without AD with supervision, antalgic gait. Provided pt with rollator, pt notes she has one at home she just received. PT provides education throughout session re: rollator brake management. Pt is able to ambulate increased distances into hallway with rollator & supervision, taking seated rest break PRN 2/2 fatigue. Will continue to follow pt acutely to address balance, gait with LRAD, & endurance training.    If plan is discharge home, recommend the following: A little help with walking and/or transfers;A little help with bathing/dressing/bathroom;Assistance with cooking/housework;Assist for transportation;Help with stairs or ramp for entrance   Can travel by private vehicle        Equipment Recommendations  None recommended by PT (pt reports she has a rollator at home)    Recommendations for Other Services       Precautions / Restrictions Precautions Precautions: Fall Precaution Comments: O2 dep at baseline Restrictions Weight Bearing Restrictions: No     Mobility  Bed Mobility Overal bed mobility: Modified Independent Bed Mobility: Supine to Sit     Supine to sit: Modified independent (Device/Increase time), HOB elevated, Used rails          Transfers Overall transfer level: Needs assistance Equipment used: None Transfers: Sit to/from Stand             General transfer comment: STS from EOB, toilet (with grab bar) without assistance with supervision    Ambulation/Gait Ambulation/Gait assistance: Supervision Gait Distance (Feet): 20 Feet (+ 20 ft + 85 ft + 85 ft) Assistive device:  None, Rollator (4 wheels) Gait Pattern/deviations: Decreased stride length, Decreased step length - right, Decreased step length - left Gait velocity: decreased     General Gait Details: Pt ambulates bed<>bathroom without AD with supervision, antalgic gait. Pt then ambulated 85 ft x 2 with rollator & supervision with improved balance & gait.   Stairs             Wheelchair Mobility     Tilt Bed    Modified Rankin (Stroke Patients Only)       Balance Overall balance assessment: Needs assistance Sitting-balance support: Feet supported Sitting balance-Leahy Scale: Good     Standing balance support: No upper extremity supported, During functional activity Standing balance-Leahy Scale: Fair Standing balance comment: standing to perform hand hygiene without LOB with supervision, pt retrieves item from floor with supervision with 1 UE support on rollator                            Cognition Arousal: Alert Behavior During Therapy: Flat affect Overall Cognitive Status: Within Functional Limits for tasks assessed                                          Exercises      General Comments General comments (skin integrity, edema, etc.): Pt toilets during session. Pt on 2L/min via nasal cannula, lowest SpO2 87% but increases with rest, education re: pursed lip breathing.  Pertinent Vitals/Pain Pain Assessment Pain Assessment: No/denies pain    Home Living                          Prior Function            PT Goals (current goals can now be found in the care plan section) Acute Rehab PT Goals Patient Stated Goal: to feel better PT Goal Formulation: With patient Time For Goal Achievement: 09/11/23 Potential to Achieve Goals: Good Progress towards PT goals: Progressing toward goals    Frequency    Min 1X/week      PT Plan      Co-evaluation              AM-PAC PT "6 Clicks" Mobility   Outcome Measure   Help needed turning from your back to your side while in a flat bed without using bedrails?: None Help needed moving from lying on your back to sitting on the side of a flat bed without using bedrails?: None Help needed moving to and from a bed to a chair (including a wheelchair)?: A Little Help needed standing up from a chair using your arms (e.g., wheelchair or bedside chair)?: None Help needed to walk in hospital room?: A Little Help needed climbing 3-5 steps with a railing? : A Little 6 Click Score: 21    End of Session Equipment Utilized During Treatment: Oxygen Activity Tolerance: Patient tolerated treatment well Patient left: in chair;with call bell/phone within reach   PT Visit Diagnosis: Muscle weakness (generalized) (M62.81);Difficulty in walking, not elsewhere classified (R26.2);Other abnormalities of gait and mobility (R26.89)     Time: 1334-1400 PT Time Calculation (min) (ACUTE ONLY): 26 min  Charges:    $Therapeutic Activity: 23-37 mins PT General Charges $$ ACUTE PT VISIT: 1 Visit                     Aleda Grana, PT, DPT 08/31/23, 2:11 PM   Sandi Mariscal 08/31/2023, 2:09 PM

## 2023-08-31 NOTE — Plan of Care (Signed)
  Problem: Clinical Measurements: Goal: Cardiovascular complication will be avoided Outcome: Progressing   Problem: Activity: Goal: Risk for activity intolerance will decrease Outcome: Progressing   Problem: Pain Managment: Goal: General experience of comfort will improve Outcome: Progressing   Problem: Safety: Goal: Ability to remain free from injury will improve Outcome: Progressing   Problem: Skin Integrity: Goal: Risk for impaired skin integrity will decrease Outcome: Progressing

## 2023-09-01 DIAGNOSIS — J189 Pneumonia, unspecified organism: Secondary | ICD-10-CM | POA: Diagnosis not present

## 2023-09-01 MED ORDER — DEXAMETHASONE 4 MG PO TABS: 4.0000 mg | ORAL_TABLET | Freq: Two times a day (BID) | ORAL | 0 refills | Status: DC

## 2023-09-01 MED ORDER — OXYCODONE HCL 5 MG PO TABS
5.0000 mg | ORAL_TABLET | Freq: Three times a day (TID) | ORAL | 0 refills | Status: DC | PRN
Start: 2023-09-01 — End: 2023-09-21

## 2023-09-01 NOTE — Progress Notes (Signed)
CSW spoke to the patient about HH at this time  patient does not want home health. CSW asked patient about other resources that may be needed. At this time finance resources as well social services resources were added. No further TOC needs at this time.

## 2023-09-01 NOTE — Discharge Summary (Signed)
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    In the ER, creatinine elevated to 1.4, CXR showed worsening RUL infiltrate.  CT head showed new occipital lucency.  Admitted on fluids, antibiotics.     * Acute metabolic encephalopathy Cerebral edema [facility]) Patient's chief complaint was weakness, fatigue, malaise.  She was treated with fluids and antibiotics without improvement.  MRI brain was obtained that showed two new brain metastases, with surrounding vasogenic edema.  She was started on dexamethasone 4 mg BID and had improvement in mentation and appetite..  Case discussed with Oncology and Radiation Oncology who will arrange follow up care for directed radiation.      Community acquired pneumonia of right upper lobe of lung Treated with  antibiotics.  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  Sarcoidosis (HCC) On Plaquenil.  AKI (acute kidney injury) (HCC) Cr 1.4 on admission, improved with fludis.                The [facility] Controlled Substances Registry was reviewed for this patient prior to discharge.   Procedures performed: MRI brain  Disposition: Home Diet recommendation:  Regular diet  DISCHARGE MEDICATION: Allergies as of 09/01/2023       Reactions   Codeine Other (See Comments)   Avoids-felt bad when taken before Pt claims she can have this but does not like to take        Medication List     STOP taking these medications    triamterene-hydrochlorothiazide 37.5-25 MG tablet Commonly known as: MAXZIDE-25       TAKE these medications    acetaminophen 325 MG tablet Commonly known as: TYLENOL Take 650 mg by mouth every 6 (six) hours as needed for mild pain or moderate pain.   albuterol (2.5 MG/3ML) 0.083% nebulizer solution Commonly known as: PROVENTIL Take 3 mLs (2.5 mg total) by nebulization every 6 (six) hours as needed for wheezing or shortness of breath. What changed: Another medication with the same name was changed. Make sure you understand how and when to take each.   albuterol 108 (90 Base) MCG/ACT inhaler Commonly known as: VENTOLIN HFA INHALE  2 PUFFS EVERY 6 HOURS AS NEEDED FOR WHEEZING OR SHORTNESS OF BREATH What changed: See the new instructions.   aspirin EC 81 MG tablet Take 1 tablet (81 mg total) by mouth daily. Start 02/23/19   atorvastatin 20 MG tablet Commonly known as: LIPITOR TAKE 1 TABLET (20 MG TOTAL) BY MOUTH DAILY.   dexamethasone 4 MG tablet Commonly known as: DECADRON Take 1 tablet (4 mg total) by mouth 2 (two) times  daily. What changed:  how much to take how to take this when to take this additional instructions   fluticasone 50 MCG/ACT nasal spray Commonly known as: FLONASE USE 2 SPRAYS IN BOTH  NOSTRILS DAILY AS NEEDED  FOR ALLERGIES What changed: See the new instructions.   hydroxychloroquine 200 MG tablet Commonly known as: PLAQUENIL TAKE 1 TABLET BY MOUTH DAILY   lidocaine-prilocaine cream Commonly known as: EMLA Apply to affected area once What changed:  how much to take how to take this when to take this additional instructions   metoprolol tartrate 25 MG tablet Commonly known as: LOPRESSOR Take 1 tablet (25 mg total) by mouth daily.   montelukast 10 MG tablet Commonly known as: SINGULAIR Take 10 mg by mouth at bedtime.   multivitamin capsule Take 1 capsule by mouth daily with breakfast.   omeprazole 20 MG capsule Commonly known as: PRILOSEC TAKE 1 CAPSULE EVERY DAY What changed: when to take this   ondansetron 4 MG tablet Commonly known as: ZOFRAN Take 1 tablet (4 mg total) by mouth every 6 (six) hours as needed for nausea.   oxyCODONE 5 MG immediate release tablet Commonly known as: Roxicodone Take 1 tablet (5 mg total) by mouth every 8 (eight) hours as needed.   OXYGEN Inhale 2-3 L/min into the lungs See admin instructions. Inhale 2-3 L/min into the lungs CONTINUOUSLY; 2 L/min at rest and 3 L/min if exerted   prochlorperazine 10 MG tablet Commonly known as: COMPAZINE Take 1 tablet (10 mg total) by mouth every 6 (six) hours as needed for nausea or vomiting.   sertraline 50 MG tablet Commonly known as: ZOLOFT TAKE 1 TABLET EVERY DAY   Stiolto Respimat 2.5-2.5 MCG/ACT Aers Generic drug: Tiotropium Bromide-Olodaterol USE 2 INHALATIONS BY MOUTH DAILY What changed: See the new instructions.        Follow-up Information     [facility] [place], [place] Follow up.   Why: [facility]  Occupational Therapy Contact information: [address] [place] [address] [phone]         Si Gaul, MD Follow up.   Specialty: Oncology Contact information: [address] [place] [address] [phone]         Margaretmary Dys, MD .   Specialty: Radiation Oncology Contact information: [address] [place] [address] [phone]                 Discharge Instructions     Discharge instructions   Complete by: As directed    **IMPORTANT DISCHARGE INSTRUCTIONS**   From Dr. Maryfrances Bunnell: You were admitted for weakness and fatigue  This may have been from a pneumonia, but more likely it was from the new metastases in your brain (the new tumors from the lung cancer, spread to the brain)  You were treated with antibiotics here for possible pneumonia  For the brain spots, you were started on dexamethasone, a steroid to reduce inflammation  Take dexamethasone  4 mg twice daily  Go see Dr. [name] and Dr. Arbutus Ped.  Call their offices on Monday to ask when you need to be seen  For now, STOP triamterene-hydrochlorothiazide your blood pressure medicine Go see your primary doctor in the next month to ask if you should restart this   Increase activity slowly   Complete by: As directed        Discharge Exam: Filed Weights   08/27/23 1223  Weight: 74.4 kg    General: Pt is alert, awake, not in acute distress Cardiovascular: RRR, nl S1-S2, no murmurs appreciated.   No LE edema.   Respiratory: Normal respiratory rate and rhythm.  CTAB without rales or wheezes. Abdominal: Abdomen soft and non-tender.  No distension or HSM.   Neuro/Psych: Strength symmetric in upper and lower extremities.  Judgment and insight appear normal.   Condition at discharge: fair  The results of significant diagnostics from this hospitalization (including imaging, microbiology, ancillary and laboratory) are listed below for  reference.   Imaging Studies: MR BRAIN W WO CONTRAST  Result Date: 08/30/2023 CLINICAL DATA:  Metastatic disease evaluation EXAM: MRI HEAD WITHOUT AND WITH CONTRAST TECHNIQUE: Multiplanar, multiecho pulse sequences of the brain and surrounding structures were obtained without and with intravenous contrast. CONTRAST:  7.25mL GADAVIST GADOBUTROL 1 MMOL/ML IV SOLN COMPARISON:  07/20/2023 FINDINGS: Brain: Redemonstrated peripherally enhancing lesion in the right postcentral gyrus, which now measures up to 9 x 9 mm (series 16, image 127), previously 13 x 12 mm slightly decreased associated edema. New peripherally enhancing lesion in the left temporal lobe, which measures up to 10 x 8 mm (series 16, image 56). Additional peripherally enhancing lesion in the left occipital lobe measures up to 10 x 9 mm (series 16, image 56). The lesions are associated with moderate surrounding edema. No evidence of acute infarct chin, hemorrhage, mass effect, or midline shift. No hydrocephalus or extra-axial collection. Vascular: Normal arterial flow voids. Normal arterial and venous enhancement. Skull and upper cervical spine: Normal marrow signal. Sinuses/Orbits: Clear paranasal sinuses. Status post bilateral lens replacements. Other: The mastoids are well aerated. IMPRESSION: 1. New peripherally enhancing lesions in the left temporal and occipital lobes, consistent with new metastatic disease. 2. Slightly decrease in the size of a previously noted right postcentral gyrus lesion, with decreased associated edema. Electronically Signed   By: Wiliam Ke M.D.   On: 08/30/2023 04:19   CT Head Wo Contrast  Result Date: 08/27/2023 CLINICAL DATA:  Mental status change, unknown cause EXAM: CT HEAD WITHOUT CONTRAST TECHNIQUE: Contiguous axial images were obtained from the base of the skull through the vertex without intravenous contrast. RADIATION DOSE REDUCTION: This exam was performed according to the departmental dose-optimization  program which includes automated exposure control, adjustment of the mA and/or kV according to patient size and/or use of iterative reconstruction technique. COMPARISON:  MRI 07/20/2023. FINDINGS: Brain: Hypodensity in the left occipital region appears new. Similar edema in the right frontoparietal region. No substantial mass effect. No midline shift. No hydrocephalus. No acute hemorrhage. Vascular: No hyperdense vessel. Skull: No acute fracture. Sinuses/Orbits: Clear sinuses.  No acute orbital findings. Other: No mastoid effusions. IMPRESSION: 1. Hypodensity in the left occipital region could represent interval (since 07/20/2023) infarct or metastasis. Recommend MRI head with contrast to further evaluate. 2. Grossly similar edema in the right frontoparietal region correlates with known metastatic lesion seen on prior MRI. No progressive mass effect. Electronically Signed   By: Feliberto Harts M.D.   On: 08/27/2023 17:12  DG Chest Port 1 View  Result Date: 08/27/2023 CLINICAL DATA:  Weakness.  History of lung cancer EXAM: PORTABLE CHEST 1 VIEW COMPARISON:  X-ray 07/09/2023.  PET-CT scan 08/03/2023 FINDINGS: Volume loss of the right hemithorax with slightly increasing opacity in the right upper lung compared to prior x-ray. Surgical clips in the right axillary region. Left lung is grossly clear. No pneumothorax, effusion or edema. Normal cardiopericardial silhouette. Interval placement of a right IJ chest port with the tip along the upper SVC. Overlapping cardiac leads. IMPRESSION: Chest port. Increasing parenchymal opacity in the right upper lung. Acute infiltrative process is possible. Please correlate for known history of a right upper lobe lung cancer. Electronically Signed   By: Karen Kays M.D.   On: 08/27/2023 14:33   NM PET Image Initial (PI) Skull Base To Thigh  Result Date: 08/06/2023 CLINICAL DATA:  Initial treatment strategy for lung cancer. EXAM: NUCLEAR MEDICINE PET SKULL BASE TO THIGH  TECHNIQUE: 8.14 mCi F-18 FDG was injected intravenously. Full-ring PET imaging was performed from the skull base to thigh after the radiotracer. CT data was obtained and used for attenuation correction and anatomic localization. Fasting blood glucose: 88 mg/dl COMPARISON:  96/02/5408 FINDINGS: Mediastinal blood pool activity: SUV max 2.96 Liver activity: SUV max NA NECK: No hypermetabolic lymph nodes in the neck. Incidental CT findings: None. CHEST: Right upper lobe lung mass measures 6.1 x 4.7 cm with SUV max of 25.10, image 54/4. Central photopenia within this mass reflects internal necrosis. No additional tracer avid nodule or mass identified. No tracer avid mediastinal or hilar lymph nodes. The nodule within the posterior left lower lobe measuring 9 mm does not show any significant tracer activity, image 87/4. Incidental CT findings: Scattered areas of parenchymal scarring and architectural distortion identified bilaterally. Emphysema. Aortic atherosclerosis. ABDOMEN/PELVIS: No abnormal hypermetabolic activity within the liver, pancreas, adrenal glands, or spleen. No hypermetabolic lymph nodes in the abdomen or pelvis. Incidental CT findings: Aortic atherosclerotic calcifications. SKELETON: Tracer avid lesion involving the T1 vertebra has an SUV max of 8.62, image 44 the fused PET-CT images. No corresponding lytic or sclerotic changes identified on the CT images. Incidental CT findings: None. IMPRESSION: 1. Right upper lobe lung mass is intensely tracer avid and consistent with primary bronchogenic carcinoma. 2. No tracer avid mediastinal or hilar lymph nodes. 3. Tracer avid lesion involving the T1 vertebra is consistent with osseous metastatic disease. 4. No additional sites of metastatic disease identified. 5. Aortic Atherosclerosis (ICD10-I70.0). Electronically Signed   By: Signa Kell M.D.   On: 08/06/2023 10:07    Microbiology: Results for orders placed or performed during the hospital encounter of  08/27/23  Resp panel by RT-PCR (RSV, Flu A&B, Covid) Anterior Nasal Swab     Status: None   Collection Time: 08/27/23 11:50 AM   Specimen: Anterior Nasal Swab  Result Value Ref Range Status   SARS Coronavirus 2 by RT PCR NEGATIVE NEGATIVE Final    Comment: (NOTE) SARS-CoV-2 target nucleic acids are NOT DETECTED.  The SARS-CoV-2 RNA is generally detectable in upper respiratory specimens during the acute phase of infection. The lowest concentration of SARS-CoV-2 viral copies this assay can detect is 138 copies/mL. A negative result does not preclude SARS-Cov-2 infection and should not be used as the sole basis for treatment or other patient management decisions. A negative result may occur with  improper specimen collection/handling, submission of specimen other than nasopharyngeal swab, presence of viral mutation(s) within the areas targeted by this assay, and inadequate  number of viral copies(<138 copies/mL). A negative result must be combined with clinical observations, patient history, and epidemiological information. The expected result is Negative.  Fact Sheet for Patients:  BloggerCourse.com  Fact Sheet for Healthcare Providers:  SeriousBroker.it  This test is no t yet approved or cleared by the [place] FDA and  has been authorized for detection and/or diagnosis of SARS-CoV-2 by FDA under an Emergency Use Authorization (EUA). This EUA will remain  in effect (meaning this test can be used) for the duration of the COVID-19 declaration under Section 564(b)(1) of the Act, 21 U.S.C.section 360bbb-3(b)(1), unless the authorization is terminated  or revoked sooner.       Influenza A by PCR NEGATIVE NEGATIVE Final   Influenza B by PCR NEGATIVE NEGATIVE Final    Comment: (NOTE) The Xpert Xpress SARS-CoV-2/FLU/RSV plus assay is intended as an aid in the diagnosis of influenza from Nasopharyngeal swab specimens and should not  be used as a sole basis for treatment. Nasal washings and aspirates are unacceptable for Xpert Xpress SARS-CoV-2/FLU/RSV testing.  Fact Sheet for Patients: BloggerCourse.com  Fact Sheet for Healthcare Providers: SeriousBroker.it  This test is not yet approved or cleared by the [place] FDA and has been authorized for detection and/or diagnosis of SARS-CoV-2 by FDA under an Emergency Use Authorization (EUA). This EUA will remain in effect (meaning this test can be used) for the duration of the COVID-19 declaration under Section 564(b)(1) of the Act, 21 U.S.C. section 360bbb-3(b)(1), unless the authorization is terminated or revoked.     Resp Syncytial Virus by PCR NEGATIVE NEGATIVE Final    Comment: (NOTE) Fact Sheet for Patients: BloggerCourse.com  Fact Sheet for Healthcare Providers: SeriousBroker.it  This test is not yet approved or cleared by the [place] FDA and has been authorized for detection and/or diagnosis of SARS-CoV-2 by FDA under an Emergency Use Authorization (EUA). This EUA will remain in effect (meaning this test can be used) for the duration of the COVID-19 declaration under Section 564(b)(1) of the Act, 21 U.S.C. section 360bbb-3(b)(1), unless the authorization is terminated or revoked.  Performed at [facility], [address], [place], [place] [address]   Group A Strep by PCR     Status: None   Collection Time: 08/27/23  2:15 PM   Specimen: Throat; Sterile Swab  Result Value Ref Range Status   Group A Strep by PCR NOT DETECTED NOT DETECTED Final    Comment: Performed at [facility], [address] [place], [place] [address]  Urine Culture (for pregnant, neutropenic or urologic patients or patients with an indwelling urinary catheter)     Status: Abnormal   Collection Time: 08/28/23  9:26 AM   Specimen:  Urine, Clean Catch  Result Value Ref Range Status   Specimen Description   Final    URINE, CLEAN CATCH Performed at [facility], [address], [place], [place] [address]    Special Requests   Final    NONE Performed at [facility], [address], [place], [place] [address]    Culture 30,000 COLONIES/mL ESCHERICHIA COLI (A)  Final   Report Status 08/30/2023 FINAL  Final   Organism ID, Bacteria ESCHERICHIA COLI (A)  Final      Susceptibility   Escherichia coli - MIC*    AMPICILLIN >=32 RESISTANT Resistant     CEFAZOLIN 8 SENSITIVE Sensitive     CEFEPIME <=0.12 SENSITIVE Sensitive     CEFTRIAXONE 1 SENSITIVE  Sensitive     CIPROFLOXACIN <=0.25 SENSITIVE Sensitive     GENTAMICIN <=1 SENSITIVE Sensitive     IMIPENEM <=0.25 SENSITIVE Sensitive     NITROFURANTOIN <=16 SENSITIVE Sensitive     TRIMETH/SULFA <=20 SENSITIVE Sensitive     AMPICILLIN/SULBACTAM 16 INTERMEDIATE Intermediate     PIP/TAZO 8 SENSITIVE Sensitive ug/mL    * 30,000 COLONIES/mL ESCHERICHIA COLI  Expectorated Sputum Assessment w Gram Stain, Rflx to Resp Cult     Status: None   Collection Time: 08/29/23 12:00 PM   Specimen: Expectorated Sputum  Result Value Ref Range Status   Specimen Description EXPECTORATED SPUTUM  Final   Special Requests NONE  Final   Sputum evaluation   Final    Sputum specimen not acceptable for testing.  Please recollect.   Quintella Reichert, B ON 08/29/23 CAL  Performed at [facility], [address] [place], [place] [address]    Report Status 08/29/2023 FINAL  Final   *Note: Due to a large number of results and/or encounters for the requested time period, some results have not been displayed. A complete set of results can be found in Results Review.    Labs: CBC: Recent Labs  Lab 08/27/23 1205 08/28/23 0508 08/29/23 0451 08/30/23 0506 08/31/23 0634  WBC 14.6* 14.8* 13.3* 11.2* 12.1*  NEUTROABS 12.9*  --  11.4*  --   --   HGB  9.6* 8.5* 8.0* 7.2* 7.4*  HCT 31.6* 27.9* 26.4* 23.8* 24.8*  MCV 80.0 81.1 81.7 81.8 83.2  PLT 489* 448* 417* 337 378   Basic Metabolic Panel: Recent Labs  Lab 08/27/23 1205 08/28/23 0508 08/29/23 0451 08/30/23 0506 08/31/23 0634  NA 137 138 140 143 141  K 4.5 3.8 3.5 3.8 5.2*  CL 91* 98 101 105 108  CO2 29 26 28 26 25   GLUCOSE 80 92 103* 77 162*  BUN 29* 24* 20 11 14   CREATININE 1.36* 1.11* 1.02* 0.76 0.91  CALCIUM 9.6 9.0 8.7* 8.5* 8.2*   Liver Function Tests: Recent Labs  Lab 08/27/23 1205 08/30/23 0506  AST 13* 10*  ALT 13 9  ALKPHOS 89 50  BILITOT 0.8 0.8  PROT 7.7 5.5*  ALBUMIN 2.9* 2.0*   CBG: Recent Labs  Lab 08/27/23 1137  GLUCAP 76    Discharge time spent: approximately 25 minutes spent on discharge counseling, evaluation of patient on day of discharge, and coordination of discharge planning with nursing, social work, pharmacy and case management  Signed: Alberteen Sam, MD [facility] 09/01/2023

## 2023-09-01 NOTE — Plan of Care (Signed)
  Problem: Safety: Goal: Ability to remain free from injury will improve Outcome: Progressing   

## 2023-09-01 NOTE — Discharge Instructions (Signed)
Please follow up with Social Services for help with utilities

## 2023-09-03 ENCOUNTER — Other Ambulatory Visit: Payer: 59

## 2023-09-03 ENCOUNTER — Other Ambulatory Visit: Payer: 59 | Admitting: Radiation Oncology

## 2023-09-03 ENCOUNTER — Ambulatory Visit: Payer: 59 | Admitting: Radiology

## 2023-09-03 ENCOUNTER — Ambulatory Visit: Payer: 59

## 2023-09-03 ENCOUNTER — Telehealth: Payer: Self-pay

## 2023-09-03 NOTE — Telephone Encounter (Signed)
RN returning call to [name] (daughter) [name] regarding care.  She had received message regarding an appointment scheduled for 09/06/2023 radiation treatment.  [name] thought radiation treatment was completed on 08/28/2023.  RN explained to her that radiation treatment that was completed was to the lung.  [name] had recently been diagnosed with a new lesion to brain while inpatient and that's the appointment reminder they received for 09/06/2023.  RN expressed the need for her to have [name] here at the Cancer center by 3:15 pm to start on time at 3:30 pm.  She was a bit upset that there was a new lesion but verbalized that she would have her mother here to Cancer Center by 3:15 pm on 09/06/2023.  Radiation therapists were made aware.

## 2023-09-04 ENCOUNTER — Telehealth: Payer: Self-pay | Admitting: Physician Assistant

## 2023-09-04 DIAGNOSIS — C3490 Malignant neoplasm of unspecified part of unspecified bronchus or lung: Secondary | ICD-10-CM | POA: Diagnosis present

## 2023-09-04 DIAGNOSIS — G936 Cerebral edema: Secondary | ICD-10-CM | POA: Diagnosis present

## 2023-09-04 DIAGNOSIS — C7931 Secondary malignant neoplasm of brain: Secondary | ICD-10-CM | POA: Diagnosis present

## 2023-09-04 NOTE — Progress Notes (Unsigned)
[facility] OFFICE PROGRESS NOTE  [name]., FNP [address] [place] [address]  DIAGNOSIS: Stage IV (T3, N0, M1C) non-small cell lung cancer, squamous cell carcinoma presented with right upper lobe lung mass in addition to solitary brain metastasis and T1 bone metastasis. She was diagnosed in August 2023  PDL1 Expression: 20%  PRIOR THERAPY:  1) SRS to the metastatic brain lesion on 07/23/2023 under the care of Dr. [name]. 2) SBRT to the right upper lobe lung mass and SBRT to the solitary osseous metastasis at T1. Last dose on 08/27/23  CURRENT THERAPY:  1) palliative systemic chemoimmunotherapy with carboplatin for AUC of 5, paclitaxel 175 Mg/M2 and Keytruda 200 Mg IV every 3 weeks with Neulasta support. First dose expected on 09/17/23.  2) SRS to the new left temporal and occipital lobe metastatic brain lesion under the care of Dr. [name], expected on 09/06/23.    INTERVAL HISTORY: JASON HERBST 62 y.o. female returns to the clinic today for a follow-up visit. She was last seen in the clinic by Dr. Arbutus Ped on 07/26/2023.  She was establishing care in the clinic that day.  The plan was to start systemic palliative chemotherapy and immunotherapy on 08/27/23.  She completed SBRT to T1 and the right upper lobe lung mass. She also had a Port-A-Cath placed. She does not believe she had a chemo-education class. She has some transportation issues and is wondering if assistance with transportation can be arranged.   Unfortunately, she presented to the ER on 10/14 for weakness, intermittent confusion, and poor appetite (therefore, she was not seen to start chemo that day). Her CXR showed worsening RUL infiltrate. Her neuroimaging showed new metastatic brain lesions to left temporal and occipital lobe metastatic brain lesion. She received antibiotics. She is seeing Dr. [name] who is planning SRS to these new lesions on 09/06/23. She was started on dexamethasone 4 mg BID and  had improvement in mentation and appetite.   Overall, she states that she is feeling "all right".  Her weakness and fatigue is improving since she got discharged from the hospital.  She states that prior to the hospitalization she could barely sit upright.  She reports her baseline dyspnea on exertion is about the same.  She is on 2 L of supplemental oxygen at baseline.  Sometimes if she is exerting herself, she may be up to 3 or 4 L.  Denies any cough, chest pain, or hemoptysis.  Denies any headache or visual changes.  Denies any nausea or vomiting.  She has a little bit of diarrhea 3-4 times per day.  She has not tried taking any Imodium. She recently had a PET scan performed.  She is here today for evaluation repeat blood work.    MEDICAL HISTORY: Past Medical History:  Diagnosis Date   Acute on chronic respiratory failure (HCC) 10/09/2016   Allergic rhinitis    Breast cancer (HCC) 2016   right breast   COPD, mild (HCC) FOLLOWED BY DR MWUX   Depression    GERD (gastroesophageal reflux disease)    HTN (hypertension)    Hypertension    Iron deficiency anemia    Long-term current use of steroids SYMBICORT INHALER   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    Overweight (BMI 25.0-29.9) 05/11/2016   Palpitations    Personal history of radiation therapy 2016   Sarcoidosis STABLE PER CXR JUNE 2013   Shortness of breath    Sickle cell trait (HCC)  ALLERGIES:  is allergic to codeine.  MEDICATIONS:  Current Outpatient Medications  Medication Sig Dispense Refill   acetaminophen (TYLENOL) 325 MG tablet Take 650 mg by mouth every 6 (six) hours as needed for mild pain or moderate pain.     albuterol (PROVENTIL) (2.5 MG/3ML) 0.083% nebulizer solution Take 3 mLs (2.5 mg total) by nebulization every 6 (six) hours as needed for wheezing or shortness of breath. 75 mL 1   albuterol (VENTOLIN HFA) 108 (90 Base) MCG/ACT inhaler INHALE 2 PUFFS EVERY 6 HOURS AS NEEDED FOR WHEEZING OR SHORTNESS OF BREATH (Patient taking  differently: Inhale 2 puffs into the lungs every 6 (six) hours as needed for shortness of breath or wheezing.) 54 g 0   aspirin EC 81 MG tablet Take 1 tablet (81 mg total) by mouth daily. Start 02/23/19     atorvastatin (LIPITOR) 20 MG tablet TAKE 1 TABLET (20 MG TOTAL) BY MOUTH DAILY. 90 tablet 3   dexamethasone (DECADRON) 4 MG tablet Take 1 tablet (4 mg total) by mouth 2 (two) times daily. 60 tablet 0   fluticasone (FLONASE) 50 MCG/ACT nasal spray USE 2 SPRAYS IN BOTH  NOSTRILS DAILY AS NEEDED  FOR ALLERGIES (Patient taking differently: Place 2 sprays into both nostrils daily as needed for allergies.) 48 g 11   hydroxychloroquine (PLAQUENIL) 200 MG tablet TAKE 1 TABLET BY MOUTH DAILY 100 tablet 2   lidocaine-prilocaine (EMLA) cream Apply to affected area once (Patient taking differently: Apply 1 Application topically See admin instructions. Apply to affected area once as directed) 30 g 3   metoprolol tartrate (LOPRESSOR) 25 MG tablet Take 1 tablet (25 mg total) by mouth daily.     montelukast (SINGULAIR) 10 MG tablet Take 10 mg by mouth at bedtime.     Multiple Vitamin (MULTIVITAMIN) capsule Take 1 capsule by mouth daily with breakfast.     omeprazole (PRILOSEC) 20 MG capsule TAKE 1 CAPSULE EVERY DAY (Patient taking differently: Take 20 mg by mouth daily before breakfast.) 90 capsule 1   ondansetron (ZOFRAN) 4 MG tablet Take 1 tablet (4 mg total) by mouth every 6 (six) hours as needed for nausea. 20 tablet 0   oxyCODONE (ROXICODONE) 5 MG immediate release tablet Take 1 tablet (5 mg total) by mouth every 8 (eight) hours as needed. 12 tablet 0   OXYGEN Inhale 2-3 L/min into the lungs See admin instructions. Inhale 2-3 L/min into the lungs CONTINUOUSLY; 2 L/min at rest and 3 L/min if exerted     prochlorperazine (COMPAZINE) 10 MG tablet Take 1 tablet (10 mg total) by mouth every 6 (six) hours as needed for nausea or vomiting. 30 tablet 1   sertraline (ZOLOFT) 50 MG tablet TAKE 1 TABLET EVERY DAY  (Patient taking differently: Take 50 mg by mouth daily.) 90 tablet 1   STIOLTO RESPIMAT 2.5-2.5 MCG/ACT AERS USE 2 INHALATIONS BY MOUTH DAILY (Patient taking differently: Inhale 2 puffs into the lungs in the morning.) 12 g 3   No current facility-administered medications for this visit.   Facility-Administered Medications Ordered in Other Visits  Medication Dose Route Frequency Provider Last Rate Last Admin   heparin lock flush 100 unit/mL  500 Units Intracatheter Once Serena Croissant, MD       sodium chloride flush (NS) 0.9 % injection 10 mL  10 mL Intracatheter Once Serena Croissant, MD        SURGICAL HISTORY:  Past Surgical History:  Procedure Laterality Date   ADJUSTABLE SUTURE MANIPULATION  05/22/2012  Procedure: ADJUSTABLE SUTURE MANIPULATION;  Surgeon: Corinda Gubler, MD;  Location: [facility];  Service: Ophthalmology;  Laterality: Right;   BREAST LUMPECTOMY Right 2016   BRONCHIAL BIOPSY  07/09/2023   Procedure: BRONCHIAL BIOPSIES;  Surgeon: Leslye Peer, MD;  Location: [facility] ENDOSCOPY;  Service: Pulmonary;;   BRONCHIAL BRUSHINGS  07/09/2023   Procedure: BRONCHIAL BRUSHINGS;  Surgeon: Leslye Peer, MD;  Location: [facility] ENDOSCOPY;  Service: Pulmonary;;   BRONCHIAL NEEDLE ASPIRATION BIOPSY  07/09/2023   Procedure: BRONCHIAL NEEDLE ASPIRATION BIOPSIES;  Surgeon: Leslye Peer, MD;  Location: [facility] ENDOSCOPY;  Service: Pulmonary;;   CESAREAN SECTION  1986   W/ BILATERAL TUBAL LIGATION   COLONOSCOPY WITH PROPOFOL N/A 02/21/2019   Procedure: COLONOSCOPY WITH PROPOFOL;  Surgeon: Hilarie Fredrickson, MD;  Location: [facility] ENDOSCOPY;  Service: Endoscopy;  Laterality: N/A;   EYE SURGERY     IR IMAGING GUIDED PORT INSERTION  08/01/2023   MEDIAN RECTUS REPAIR  05/22/2012   Procedure: MEDIAN RECTUS REPAIR;  Surgeon: Corinda Gubler, MD;  Location: [facility];  Service: Ophthalmology;  Laterality: Bilateral;  INFERIOR RECTUS RESECTION WITH ADJUSTIBLE SUTURES RIGHT EYE     POLYPECTOMY  02/21/2019   Procedure: POLYPECTOMY;  Surgeon: Hilarie Fredrickson, MD;  Location: [facility] ENDOSCOPY;  Service: Endoscopy;;   RADIOACTIVE SEED GUIDED PARTIAL MASTECTOMY WITH AXILLARY SENTINEL LYMPH NODE BIOPSY Right 08/18/2015   Procedure: RADIOACTIVE SEED GUIDED PARTIAL MASTECTOMY WITH AXILLARY SENTINEL LYMPH NODE BIOPSY;  Surgeon: Almond Lint, MD;  Location: [facility];  Service: General;  Laterality: Right;   TOTAL ROBOTIC ASSISTED LAPAROSCOPIC HYSTERECTOMY  12-30-2010   SYMPTOMATIC UTERINE FIBROIDS   UPPER TEETH EXTRACTION'S  1992   VIDEO BRONCHOSCOPY WITH RADIAL ENDOBRONCHIAL ULTRASOUND  07/09/2023   Procedure: VIDEO BRONCHOSCOPY WITH RADIAL ENDOBRONCHIAL ULTRASOUND;  Surgeon: Leslye Peer, MD;  Location: [facility] ENDOSCOPY;  Service: Pulmonary;;    REVIEW OF SYSTEMS:   Review of Systems  Constitutional: Positive for improving fatigue and weakness.  Negative for appetite change, chills, fever and unexpected weight change.  HENT: Negative for mouth sores, nosebleeds, sore throat and trouble swallowing.   Eyes: Negative for eye problems and icterus.  Respiratory: Positive for baseline dyspnea. Negative for cough, hemoptysis, and wheezing.   Cardiovascular: Negative for chest pain and leg swelling.  Gastrointestinal: Negative for abdominal pain, constipation, diarrhea, nausea and vomiting.  Genitourinary: Negative for bladder incontinence, difficulty urinating, dysuria, frequency and hematuria.   Musculoskeletal: Negative for back pain, gait problem, neck pain and neck stiffness.  Skin: Negative for itching and rash.  Neurological: Negative for dizziness, extremity weakness, gait problem, headaches, light-headedness and seizures.  Hematological: Negative for adenopathy. Does not bruise/bleed easily.  Psychiatric/Behavioral: Negative for confusion, depression and sleep disturbance. The patient is not nervous/anxious.     PHYSICAL EXAMINATION:  There were no vitals taken for  this visit.  ECOG PERFORMANCE STATUS: 2  Physical Exam  Constitutional: Oriented to person, place, and time and chronically ill appearing female, and in no distress.  HENT:  Head: Normocephalic and atraumatic.  Mouth/Throat: Oropharynx is clear and moist. No oropharyngeal exudate.  Eyes: Conjunctivae are normal. Right eye exhibits no discharge. Left eye exhibits no discharge. No scleral icterus.  Neck: Normal range of motion. Neck supple.  Cardiovascular: Normal rate, regular rhythm, normal heart sounds and intact distal pulses.   Pulmonary/Chest: Effort normal and breath sounds normal. No respiratory distress. No wheezes. No rales. On supplemental oxygen.  Abdominal: Soft. Bowel sounds are normal. Exhibits  no distension and no mass. There is no tenderness.  Musculoskeletal: Normal range of motion. Exhibits no edema.  Lymphadenopathy:    No cervical adenopathy.  Neurological: Alert and oriented to person, place, and time. Exhibits muscle wasting. She was examined in the wheelchair.  Skin: Skin is warm and dry. No rash noted. Not diaphoretic. No erythema. No pallor.  Psychiatric: Mood, memory and judgment normal.  Vitals reviewed.  LABORATORY DATA: Lab Results  Component Value Date   WBC 12.1 (H) 08/31/2023   HGB 7.4 (L) 08/31/2023   HCT 24.8 (L) 08/31/2023   MCV 83.2 08/31/2023   PLT 378 08/31/2023      Chemistry      Component Value Date/Time   NA 141 08/31/2023 0634   NA 146 (H) 01/10/2019 1220   K 5.2 (H) 08/31/2023 0634   CL 108 08/31/2023 0634   CO2 25 08/31/2023 0634   BUN 14 08/31/2023 0634   BUN 21 01/10/2019 1220   CREATININE 0.91 08/31/2023 0634   CREATININE 0.94 08/07/2023 0833   CREATININE 0.64 02/04/2015 1619      Component Value Date/Time   CALCIUM 8.2 (L) 08/31/2023 0634   ALKPHOS 50 08/30/2023 0506   AST 10 (L) 08/30/2023 0506   AST 10 (L) 08/07/2023 0833   ALT 9 08/30/2023 0506   ALT 9 08/07/2023 0833   BILITOT 0.8 08/30/2023 0506   BILITOT 0.3  08/07/2023 0833       RADIOGRAPHIC STUDIES:  MR BRAIN W WO CONTRAST  Result Date: 08/30/2023 CLINICAL DATA:  Metastatic disease evaluation EXAM: MRI HEAD WITHOUT AND WITH CONTRAST TECHNIQUE: Multiplanar, multiecho pulse sequences of the brain and surrounding structures were obtained without and with intravenous contrast. CONTRAST:  7.63mL GADAVIST GADOBUTROL 1 MMOL/ML IV SOLN COMPARISON:  07/20/2023 FINDINGS: Brain: Redemonstrated peripherally enhancing lesion in the right postcentral gyrus, which now measures up to 9 x 9 mm (series 16, image 127), previously 13 x 12 mm slightly decreased associated edema. New peripherally enhancing lesion in the left temporal lobe, which measures up to 10 x 8 mm (series 16, image 56). Additional peripherally enhancing lesion in the left occipital lobe measures up to 10 x 9 mm (series 16, image 56). The lesions are associated with moderate surrounding edema. No evidence of acute infarct chin, hemorrhage, mass effect, or midline shift. No hydrocephalus or extra-axial collection. Vascular: Normal arterial flow voids. Normal arterial and venous enhancement. Skull and upper cervical spine: Normal marrow signal. Sinuses/Orbits: Clear paranasal sinuses. Status post bilateral lens replacements. Other: The mastoids are well aerated. IMPRESSION: 1. New peripherally enhancing lesions in the left temporal and occipital lobes, consistent with new metastatic disease. 2. Slightly decrease in the size of a previously noted right postcentral gyrus lesion, with decreased associated edema. Electronically Signed   By: Wiliam Ke M.D.   On: 08/30/2023 04:19   CT Head Wo Contrast  Result Date: 08/27/2023 CLINICAL DATA:  Mental status change, unknown cause EXAM: CT HEAD WITHOUT CONTRAST TECHNIQUE: Contiguous axial images were obtained from the base of the skull through the vertex without intravenous contrast. RADIATION DOSE REDUCTION: This exam was performed according to the departmental  dose-optimization program which includes automated exposure control, adjustment of the mA and/or kV according to patient size and/or use of iterative reconstruction technique. COMPARISON:  MRI 07/20/2023. FINDINGS: Brain: Hypodensity in the left occipital region appears new. Similar edema in the right frontoparietal region. No substantial mass effect. No midline shift. No hydrocephalus. No acute hemorrhage. Vascular: No hyperdense vessel.  Skull: No acute fracture. Sinuses/Orbits: Clear sinuses.  No acute orbital findings. Other: No mastoid effusions. IMPRESSION: 1. Hypodensity in the left occipital region could represent interval (since 07/20/2023) infarct or metastasis. Recommend MRI head with contrast to further evaluate. 2. Grossly similar edema in the right frontoparietal region correlates with known metastatic lesion seen on prior MRI. No progressive mass effect. Electronically Signed   By: Feliberto Harts M.D.   On: 08/27/2023 17:12   DG Chest Port 1 View  Result Date: 08/27/2023 CLINICAL DATA:  Weakness.  History of lung cancer EXAM: PORTABLE CHEST 1 VIEW COMPARISON:  X-ray 07/09/2023.  PET-CT scan 08/03/2023 FINDINGS: Volume loss of the right hemithorax with slightly increasing opacity in the right upper lung compared to prior x-ray. Surgical clips in the right axillary region. Left lung is grossly clear. No pneumothorax, effusion or edema. Normal cardiopericardial silhouette. Interval placement of a right IJ chest port with the tip along the upper SVC. Overlapping cardiac leads. IMPRESSION: Chest port. Increasing parenchymal opacity in the right upper lung. Acute infiltrative process is possible. Please correlate for known history of a right upper lobe lung cancer. Electronically Signed   By: Karen Kays M.D.   On: 08/27/2023 14:33      ASSESSMENT/PLAN:  This is a very pleasant 62 year old African-American female with stage IV (T3, N0, M1 C) non-small cell lung cancer, squamous cell carcinoma.   She presented with a right upper lobe lung mass in addition to a solitary brain metastasis and a T1 bone lesion.  She was diagnosed in September 2024. Her PDL1 expression is 20%.   The patient underwent SRS to the solitary brain lesion and is scheduled to complete radiation to T1 in the right upper lobe lung mass  on 08/27/2023.  She unfortunately developed two new brain lesions since her September 2024 MRI. She is expected to undergo SRS on 09/06/23 by Dr. [name].  I reviewed her schedule with her today and discussed the importance of making it to her radiation appointment tomorrow as scheduled.  The patient was seen with Dr. Arbutus Ped today.  Dr. Arbutus Ped personally independently reviewed the patient's PET scan and reviewed the results with the patient today.  The scan showed known RUL lung mass and T1 lesion without any other sites of disease on PET.   Dr. Arbutus Ped still is recommending palliative systemic chemotherapy and immunotherapy with carboplatin for an AUC of 5, paclitaxel 175 mg/m, and Keytruda 20 mg IV every 3 weeks with Neulasta support.  She is scheduled for her first cycle of treatment on 09/17/23.   Labs today show significant anemia with a hemoglobin of 7.3.  Per chart review does appear she may have sickle cell trait.  I would recommend units of blood.  We will call the patient and see if we could schedule this later this week.  I briefly told the patient during the appointment today prior to her labs coming back that she may need a blood transfusion and the rationale prior to starting chemotherapy as treatment will cause myelosuppression.  Have added on some anemia today with B12, folate, iron, and ferritin.  It does not appear that the patient has had her chemo education class.  I have help facilitate this to be scheduled for next week on 09/11/2023.  The patient has some transportation constraints.  We will reach out to the transportation coordinator to see if they can assist with  this.  The patient was advised to call immediately if she has any concerning  symptoms in the interval. The patient voices understanding of current disease status and treatment options and is in agreement with the current care plan. All questions were answered. The patient knows to call the clinic with any problems, questions or concerns. We can certainly see the patient much sooner if necessary   Orders Placed This Encounter  Procedures   CBC with Differential (Cancer Center Only)    Standing Status:   Future    Standing Expiration Date:   09/16/2024   CMP (Cancer Center only)    Standing Status:   Future    Standing Expiration Date:   09/16/2024   T4    Standing Status:   Future    Standing Expiration Date:   09/16/2024   TSH    Standing Status:   Future    Standing Expiration Date:   09/16/2024   Vitamin B12    Standing Status:   Future    Standing Expiration Date:   09/04/2024   Folate    Standing Status:   Future    Standing Expiration Date:   09/04/2024   Iron and Iron Binding Capacity ([facility] only)    Standing Status:   Future    Standing Expiration Date:   09/04/2024   Ferritin    Standing Status:   Future    Standing Expiration Date:   09/04/2024   Sample to Blood Bank    Standing Status:   Standing    Number of Occurrences:   10    Standing Expiration Date:   09/04/2024   ABO/RH    Standing Status:   Standing    Number of Occurrences:   1    Standing Expiration Date:   09/04/2024      Johnette Abraham Jalyric Kaestner, PA-C 09/05/23  ADDENDUM: Hematology/Oncology Attending:  I had a face-to-face encounter with the patient today.  I reviewed her records, lab, scan and recommended her care plan.  This is a very pleasant 62 years old African-American female diagnosed with a stage IV non-small cell lung cancer, squamous cell carcinoma presented with right upper lobe lung mass in addition to T1 pulm metastasis and recently diagnosed several brain metastasis diagnosed in  August 2024 and currently undergoing SRS to brain metastasis. The patient has PD-L1 expression of 20%. She was supposed to start systemic chemotherapy with carboplatin, paclitaxel and Keytruda few weeks ago but with the new brain metastasis, her treatment has been delayed. The patient is feeling much better today.  She is currently on a tapered dose of Decadron 4 mg p.o. twice daily for her brain metastasis. I recommended for the patient to continue with the taper dose of Decadron and we will delay the start of her systemic chemotherapy until September 17, 2023. The patient will come back for follow-up visit at that time. She was advised to call immediately if she has any other concerning symptoms in the interval. The total time spent in the appointment was 30 minutes. Disclaimer: This note was dictated with voice recognition software. Similar sounding words can inadvertently be transcribed and may be missed upon review. Lajuana Matte, MD

## 2023-09-05 ENCOUNTER — Other Ambulatory Visit: Payer: Self-pay | Admitting: *Deleted

## 2023-09-05 ENCOUNTER — Inpatient Hospital Stay (HOSPITAL_BASED_OUTPATIENT_CLINIC_OR_DEPARTMENT_OTHER): Payer: 59 | Admitting: Physician Assistant

## 2023-09-05 ENCOUNTER — Inpatient Hospital Stay: Payer: 59

## 2023-09-05 ENCOUNTER — Other Ambulatory Visit: Payer: Self-pay

## 2023-09-05 DIAGNOSIS — D649 Anemia, unspecified: Secondary | ICD-10-CM

## 2023-09-05 DIAGNOSIS — D509 Iron deficiency anemia, unspecified: Secondary | ICD-10-CM | POA: Diagnosis not present

## 2023-09-05 DIAGNOSIS — Z7952 Long term (current) use of systemic steroids: Secondary | ICD-10-CM | POA: Insufficient documentation

## 2023-09-05 DIAGNOSIS — Z79899 Other long term (current) drug therapy: Secondary | ICD-10-CM | POA: Diagnosis not present

## 2023-09-05 DIAGNOSIS — C7951 Secondary malignant neoplasm of bone: Secondary | ICD-10-CM | POA: Insufficient documentation

## 2023-09-05 DIAGNOSIS — C7931 Secondary malignant neoplasm of brain: Secondary | ICD-10-CM | POA: Diagnosis present

## 2023-09-05 DIAGNOSIS — C3411 Malignant neoplasm of upper lobe, right bronchus or lung: Secondary | ICD-10-CM | POA: Diagnosis present

## 2023-09-05 DIAGNOSIS — Z95828 Presence of other vascular implants and grafts: Secondary | ICD-10-CM

## 2023-09-05 DIAGNOSIS — C3491 Malignant neoplasm of unspecified part of right bronchus or lung: Secondary | ICD-10-CM

## 2023-09-05 LAB — CBC WITH DIFFERENTIAL (CANCER CENTER ONLY)
Abs Immature Granulocytes: 0.23 10*3/uL — ABNORMAL HIGH (ref 0.00–0.07)
Basophils Absolute: 0 10*3/uL (ref 0.0–0.1)
Basophils Relative: 0 %
Eosinophils Absolute: 0.1 10*3/uL (ref 0.0–0.5)
Eosinophils Relative: 1 %
HCT: 23.1 % — ABNORMAL LOW (ref 36.0–46.0)
Hemoglobin: 7.3 g/dL — ABNORMAL LOW (ref 12.0–15.0)
Immature Granulocytes: 2 %
Lymphocytes Relative: 2 %
Lymphs Abs: 0.3 10*3/uL — ABNORMAL LOW (ref 0.7–4.0)
MCH: 24.8 pg — ABNORMAL LOW (ref 26.0–34.0)
MCHC: 31.6 g/dL (ref 30.0–36.0)
MCV: 78.6 fL — ABNORMAL LOW (ref 80.0–100.0)
Monocytes Absolute: 0.3 10*3/uL (ref 0.1–1.0)
Monocytes Relative: 2 %
Neutro Abs: 12.7 10*3/uL — ABNORMAL HIGH (ref 1.7–7.7)
Neutrophils Relative %: 93 %
Platelet Count: 294 10*3/uL (ref 150–400)
RBC: 2.94 MIL/uL — ABNORMAL LOW (ref 3.87–5.11)
RDW: 18.1 % — ABNORMAL HIGH (ref 11.5–15.5)
WBC Count: 13.6 10*3/uL — ABNORMAL HIGH (ref 4.0–10.5)
nRBC: 0 % (ref 0.0–0.2)

## 2023-09-05 LAB — IRON AND IRON BINDING CAPACITY (CC-WL,HP ONLY)
Iron: 62 ug/dL (ref 28–170)
Saturation Ratios: 37 % — ABNORMAL HIGH (ref 10.4–31.8)
TIBC: 169 ug/dL — ABNORMAL LOW (ref 250–450)
UIBC: 107 ug/dL — ABNORMAL LOW (ref 148–442)

## 2023-09-05 LAB — CMP (CANCER CENTER ONLY)
ALT: 8 U/L (ref 0–44)
AST: 12 U/L — ABNORMAL LOW (ref 15–41)
Albumin: 3 g/dL — ABNORMAL LOW (ref 3.5–5.0)
Alkaline Phosphatase: 56 U/L (ref 38–126)
Anion gap: 5 (ref 5–15)
BUN: 9 mg/dL (ref 8–23)
CO2: 31 mmol/L (ref 22–32)
Calcium: 8.1 mg/dL — ABNORMAL LOW (ref 8.9–10.3)
Chloride: 105 mmol/L (ref 98–111)
Creatinine: 0.81 mg/dL (ref 0.44–1.00)
GFR, Estimated: >60 mL/min (ref >60)
Glucose, Bld: 112 mg/dL — ABNORMAL HIGH (ref 70–99)
Potassium: 3.4 mmol/L — ABNORMAL LOW (ref 3.5–5.1)
Sodium: 141 mmol/L (ref 135–145)
Total Bilirubin: 0.3 mg/dL (ref 0.3–1.2)
Total Protein: 6 g/dL — ABNORMAL LOW (ref 6.5–8.1)

## 2023-09-05 LAB — VITAMIN B12: Vitamin B-12: 592 pg/mL (ref 180–914)

## 2023-09-05 LAB — FOLATE: Folate: 6.1 ng/mL (ref >5.9)

## 2023-09-05 MED ORDER — SODIUM CHLORIDE 0.9% FLUSH: 10.0000 mL | Freq: Once | INTRAVENOUS | Status: DC

## 2023-09-05 MED ORDER — HEPARIN SOD (PORK) LOCK FLUSH 100 UNIT/ML IV SOLN: 500.0000 [IU] | Freq: Once | INTRAVENOUS | Status: DC

## 2023-09-05 NOTE — Progress Notes (Signed)
Pt is being referred for transportation.  Email sent to [employer].

## 2023-09-06 ENCOUNTER — Inpatient Hospital Stay: Payer: 59

## 2023-09-06 ENCOUNTER — Other Ambulatory Visit: Payer: Self-pay | Admitting: Physician Assistant

## 2023-09-06 ENCOUNTER — Ambulatory Visit: Payer: 59 | Admitting: Radiation Oncology

## 2023-09-06 ENCOUNTER — Telehealth: Payer: Self-pay

## 2023-09-06 ENCOUNTER — Ambulatory Visit: Payer: 59

## 2023-09-06 DIAGNOSIS — D649 Anemia, unspecified: Secondary | ICD-10-CM

## 2023-09-06 DIAGNOSIS — C3411 Malignant neoplasm of upper lobe, right bronchus or lung: Secondary | ICD-10-CM | POA: Diagnosis not present

## 2023-09-06 DIAGNOSIS — C3491 Malignant neoplasm of unspecified part of right bronchus or lung: Secondary | ICD-10-CM

## 2023-09-06 LAB — T4: T4, Total: 7.5 ug/dL (ref 4.5–12.0)

## 2023-09-06 LAB — FERRITIN: Ferritin: 274 ng/mL (ref 11–307)

## 2023-09-06 LAB — SAMPLE TO BLOOD BANK

## 2023-09-06 LAB — TSH: TSH: 0.576 u[IU]/mL (ref 0.350–4.500)

## 2023-09-06 LAB — ABO/RH: ABO/RH(D): O POS

## 2023-09-06 LAB — PREPARE RBC (CROSSMATCH)

## 2023-09-06 NOTE — Telephone Encounter (Signed)
Spoke with pt about lab appt today at 1500 and blood transfusion appt for Saturday morning at 0830.  Informed pt about blue arm band for blood and to make sure she has it for Saturday.  Pt verbalized understanding and was told to call with any concerns or questions.

## 2023-09-07 ENCOUNTER — Encounter: Payer: Self-pay | Admitting: Internal Medicine

## 2023-09-07 NOTE — Progress Notes (Signed)
Udenyca shortage; pts insur allows switch to Neulasta.   Will need to check if pt willing to use Neulasta Onpro on day of chemo tx & change orders, if so.  Ebony Hail, Pharm.D., CPP 09/07/2023@4 :23 PM

## 2023-09-08 ENCOUNTER — Inpatient Hospital Stay: Payer: 59

## 2023-09-08 DIAGNOSIS — C3411 Malignant neoplasm of upper lobe, right bronchus or lung: Secondary | ICD-10-CM | POA: Diagnosis not present

## 2023-09-08 DIAGNOSIS — D649 Anemia, unspecified: Secondary | ICD-10-CM

## 2023-09-08 MED ORDER — ACETAMINOPHEN 325 MG PO TABS
650.0000 mg | ORAL_TABLET | Freq: Once | ORAL | Status: AC
Start: 2023-09-08 — End: 2023-09-08
  Administered 2023-09-08: 650 mg via ORAL
  Filled 2023-09-08: qty 2

## 2023-09-08 MED ORDER — SODIUM CHLORIDE 0.9% FLUSH
10.0000 mL | INTRAVENOUS | Status: AC | PRN
  Administered 2023-09-08: 10 mL

## 2023-09-08 MED ORDER — DIPHENHYDRAMINE HCL 25 MG PO CAPS
25.0000 mg | ORAL_CAPSULE | Freq: Once | ORAL | Status: AC
Start: 2023-09-08 — End: 2023-09-08
  Administered 2023-09-08: 25 mg via ORAL
  Filled 2023-09-08: qty 1

## 2023-09-08 MED ORDER — HEPARIN SOD (PORK) LOCK FLUSH 100 UNIT/ML IV SOLN
500.0000 [IU] | Freq: Every day | INTRAVENOUS | Status: AC | PRN
Start: 2023-09-08 — End: 2023-09-08
  Administered 2023-09-08: 500 [IU]

## 2023-09-08 MED ORDER — SODIUM CHLORIDE 0.9% IV SOLUTION
250.0000 mL | INTRAVENOUS | Status: DC
  Administered 2023-09-08: 100 mL via INTRAVENOUS

## 2023-09-08 NOTE — Patient Instructions (Signed)

## 2023-09-10 ENCOUNTER — Ambulatory Visit
Admission: RE | Admit: 2023-09-10 | Discharge: 2023-09-10 | Disposition: A | Discharge status: Home / Self Care | Payer: 59 | Source: Ambulatory Visit | Attending: Radiation Oncology | Admitting: Radiation Oncology

## 2023-09-10 ENCOUNTER — Other Ambulatory Visit: Payer: Self-pay

## 2023-09-10 DIAGNOSIS — G936 Cerebral edema: Secondary | ICD-10-CM | POA: Diagnosis not present

## 2023-09-10 DIAGNOSIS — C3490 Malignant neoplasm of unspecified part of unspecified bronchus or lung: Secondary | ICD-10-CM

## 2023-09-10 LAB — RAD ONC ARIA SESSION SUMMARY
Course Elapsed Days: 0
Plan Fractions Treated to Date: 1
Plan Prescribed Dose Per Fraction: 20 Gy
Plan Total Fractions Prescribed: 1
Plan Total Prescribed Dose: 20 Gy
Reference Point Dosage Given to Date: 20 Gy
Reference Point Session Dosage Given: 20 Gy
Session Number: 1

## 2023-09-10 LAB — TYPE AND SCREEN
ABO/RH(D): O POS
Antibody Screen: NEGATIVE
Unit division: 0
Unit division: 0

## 2023-09-10 LAB — BPAM RBC
Blood Product Expiration Date: 202411212359
Blood Product Expiration Date: 202411212359
ISSUE DATE / TIME: 202410260834
ISSUE DATE / TIME: 202410260908
Unit Type and Rh: 5100
Unit Type and Rh: 5100

## 2023-09-10 MED ORDER — DEXAMETHASONE 4 MG PO TABS: ORAL_TABLET | ORAL | 0 refills | Status: DC

## 2023-09-10 NOTE — Addendum Note (Signed)
Encounter addended by: Julio Sicks, MD on: 09/10/2023 7:37 PM  Actions taken: Clinical Note Signed

## 2023-09-10 NOTE — Addendum Note (Signed)
Encounter addended by: Roel Cluck, RN on: 09/10/2023 2:39 PM  Actions taken: Clinical Note Signed

## 2023-09-10 NOTE — Progress Notes (Signed)
Radiation Oncology         [phone] ________________________________  Stereotactic Treatment Procedure Note  Name: AMBERLYN LEISE MRN: [id number]  Date: 09/10/2023  DOB: 1960/12/20  SPECIAL TREATMENT PROCEDURE    ICD-10-CM   1. Cerebral edema (HCC)  G93.6 dexamethasone (DECADRON) 4 MG tablet    2. 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  It showed 3-dimensional radiation distributions overlaid onto the planning CT/MRI image set.  The [facility] for the target structures as well as the organs at risk were reviewed. The documentation of the 3D plan and dosimetry are filed in the radiation oncology EMR.  NARRATIVE:  STEFANA VICENCIO was brought to the TrueBeam stereotactic radiation treatment machine and placed supine on the CT couch. The head frame was applied, and the patient was set up for stereotactic radiosurgery.  Neurosurgery was present for the set-up and delivery  SIMULATION VERIFICATION:  In the couch zero-angle position, the patient underwent Exactrac imaging using the Brainlab system with orthogonal KV images.  These were carefully aligned and repeated to confirm treatment position for each of the isocenters.  The Exactrac snap film verification was repeated at each couch angle.  PROCEDURE: [name] received stereotactic radiosurgery to the following targets:  These two targets were treated using a single isocenter with 5 Rapid Arc VMAT Beams to a prescription dose of 20 Gy.  ExacTrac registration was performed for each couch angle.  The 100% isodose line was prescribed.  6 MV X-rays were delivered in the flattening filter free beam mode.   STEREOTACTIC TREATMENT MANAGEMENT:  Following delivery, the patient was transported to nursing in stable condition and monitored for possible acute  effects.  Vital signs were recorded There were no vitals taken for this visit.. The patient tolerated treatment without significant acute effects, and was discharged to home in stable condition.    PLAN: Follow-up in one month.  ________________________________  Artist Pais. [name], M.D.

## 2023-09-10 NOTE — Progress Notes (Signed)
Radiation Oncology         [phone] ________________________________  Name: [name]  WJX:[id number]  Date of Service: 09/10/2023  DOB: [birth year]   Steroid Taper Instructions   You currently have a prescription for Dexamethasone 4 mg Tablets.   Beginning 10/11/2023  Take a 4 mg tablet twice a day  Beginning 09/17/2023: Take 1/2 of a tablet (which is 2 mg) twice a day  Beginning 09/24/2023: Take 1/2 of a tablet (which is 2 mg) once a day  Beginning 10/01/2023: Take 1/2 of a tablet (which is 2 mg) every other day and stop on 10/07/2023.   Please call our office if you have any headaches, visual changes, uncontrolled movements, extremity weakness, nausea or vomiting.

## 2023-09-10 NOTE — Progress Notes (Signed)
Patient to nursing for 30 minute observation SRS Brain 1 fraction completed.  Patient denies headache, fatigue, ringing in ears, visual changes, nausea, and no skin irritation.  Speech clear.  Patient was given Decadron taper verbal instructions and written copy.  Vitals:96.8/66/18/155/76 o2 sat 100% on 2 liters via nasal cannula.  Advised not to do anything strenuous for next 24 hours.  Call [phone] if need.

## 2023-09-10 NOTE — Op Note (Signed)
Name: [name]  MRN: [id number]  Date: 09/10/2023   DOB: 1961/08/30  Stereotactic Radiosurgery Operative Note  PRE-OPERATIVE DIAGNOSIS:  Multiple Brain Metastases  POST-OPERATIVE DIAGNOSIS:  Multiple Brain Metastases  PROCEDURE:  Stereotactic Radiosurgery  SURGEON:  Temple Pacini, MD  NARRATIVE: The patient underwent a radiation treatment planning session in the radiation oncology simulation suite under the care of the radiation oncology physician and physicist.  I participated closely in the radiation treatment planning afterwards. The patient underwent planning CT which was fused to 3T high resolution MRI with 1 mm axial slices.  These images were fused on the planning system.  We contoured the gross target volumes and subsequently expanded this to yield the Planning Target Volume. I actively participated in the planning process.  I helped to define and review the target contours and also the contours of the optic pathway, eyes, brainstem and selected nearby organs at risk.  All the dose constraints for critical structures were reviewed and compared to AAPM Task Group 101.  The prescription dose conformity was reviewed.  I approved the plan electronically.    Accordingly, [name] was brought to the TrueBeam stereotactic radiation treatment linac and placed in the custom immobilization mask.  The patient was aligned according to the IR fiducial markers with BrainLab Exactrac, then orthogonal x-rays were used in ExacTrac with the 6DOF robotic table and the shifts were made to align the patient  [name] received stereotactic radiosurgery uneventfully.    Lesions treated:  2   Complex lesions treated:  0 (>3.5 cm, <9mm of optic path, or within the brainstem)   The detailed description of the procedure is recorded in the radiation oncology procedure note.  I was present for the duration of the procedure.  DISPOSITION:  Following delivery, the patient was transported to nursing  in stable condition and monitored for possible acute effects to be discharged to home in stable condition with follow-up in one month.  Temple Pacini, MD 09/10/2023 7:37 PM

## 2023-09-11 ENCOUNTER — Other Ambulatory Visit: Payer: 59

## 2023-09-11 ENCOUNTER — Inpatient Hospital Stay: Payer: 59

## 2023-09-11 NOTE — Telephone Encounter (Signed)
Pt didn't show for appt today for chemo/immuno education.  Reached pt on second try & she slept in today & forgot appt & didn't have transportation.  Message to scheduler to move appt to thurs & also message to transportation to see if they can provide a ride.

## 2023-09-11 NOTE — Radiation Completion Notes (Addendum)
  Radiation Oncology         [phone] ________________________________  Name: [name] MRN: [id number]  Date: 09/10/2023  DOB: 1961/10/08  Referring Physician: Julio Sicks, M.D. Date of Service: 2023-09-11 Radiation Oncologist: Margaretmary Bayley, M.D. [facility]     RADIATION ONCOLOGY END OF TREATMENT NOTE     Diagnosis: 62 yo woman with two new brain metastases from right upper lung cancer   Intent: Palliative     ==========DELIVERED PLANS==========  First Treatment Date: 2023-09-10 - Last Treatment Date: 2023-09-10   Plan Name: Brain_SRS Site: Brain Technique: SBRT/SRT-IMRT Mode: Photon Dose Per Fraction: 20 Gy Prescribed Dose (Delivered / Prescribed): 20 Gy / 20 Gy Prescribed Fxs (Delivered / Prescribed): 1 / 1     ==========ON TREATMENT VISIT DATES========== 2023-09-10     See weekly On Treatment Notes in Epic for details.  She tolerated the [facility] treatment well and was discharged home in stable condition.  The patient will receive a call in about one month from the radiation oncology department. She will continue follow up with her medical oncologist, Dr. Arbutus Ped as well.  ------------------------------------------------   Margaretmary Dys, MD [facility]  Radiation Oncology Direct Dial: [phone]  Fax: [phone] [email or web address]  Skype  [email or web address]

## 2023-09-12 ENCOUNTER — Ambulatory Visit: Payer: 59

## 2023-09-12 ENCOUNTER — Ambulatory Visit: Payer: 59 | Admitting: Physician Assistant

## 2023-09-12 ENCOUNTER — Other Ambulatory Visit: Payer: 59

## 2023-09-13 ENCOUNTER — Inpatient Hospital Stay: Payer: 59

## 2023-09-13 ENCOUNTER — Other Ambulatory Visit: Payer: 59

## 2023-09-14 ENCOUNTER — Other Ambulatory Visit: Payer: Self-pay | Admitting: Internal Medicine

## 2023-09-14 ENCOUNTER — Ambulatory Visit: Payer: 59

## 2023-09-14 MED FILL — Fosaprepitant Dimeglumine For IV Infusion 150 MG (Base Eq): INTRAVENOUS | Qty: 5 | Status: AC

## 2023-09-17 ENCOUNTER — Emergency Department (HOSPITAL_COMMUNITY): Payer: 59

## 2023-09-17 ENCOUNTER — Telehealth: Payer: Self-pay

## 2023-09-17 ENCOUNTER — Inpatient Hospital Stay (HOSPITAL_COMMUNITY): Payer: 59

## 2023-09-17 ENCOUNTER — Ambulatory Visit: Payer: 59

## 2023-09-17 ENCOUNTER — Inpatient Hospital Stay (HOSPITAL_COMMUNITY)
Admission: EM | Admit: 2023-09-17 | Discharge: 2023-09-21 | DRG: 080 | Disposition: A | Discharge status: Home Health | Payer: 59 | Attending: Internal Medicine | Admitting: Internal Medicine

## 2023-09-17 ENCOUNTER — Other Ambulatory Visit: Payer: Self-pay

## 2023-09-17 ENCOUNTER — Inpatient Hospital Stay (HOSPITAL_COMMUNITY)
Admit: 2023-09-17 | Discharge: 2023-09-17 | Disposition: A | Discharge status: Home / Self Care | Payer: 59 | Attending: Internal Medicine | Admitting: Internal Medicine

## 2023-09-17 ENCOUNTER — Other Ambulatory Visit: Payer: 59

## 2023-09-17 ENCOUNTER — Encounter (HOSPITAL_COMMUNITY): Payer: Self-pay

## 2023-09-17 ENCOUNTER — Ambulatory Visit: Payer: 59 | Admitting: Internal Medicine

## 2023-09-17 ENCOUNTER — Encounter: Payer: Self-pay | Admitting: Internal Medicine

## 2023-09-17 DIAGNOSIS — Z803 Family history of malignant neoplasm of breast: Secondary | ICD-10-CM

## 2023-09-17 DIAGNOSIS — C3411 Malignant neoplasm of upper lobe, right bronchus or lung: Secondary | ICD-10-CM | POA: Diagnosis present

## 2023-09-17 DIAGNOSIS — G936 Cerebral edema: Principal | ICD-10-CM

## 2023-09-17 DIAGNOSIS — F418 Other specified anxiety disorders: Secondary | ICD-10-CM | POA: Diagnosis not present

## 2023-09-17 DIAGNOSIS — Z515 Encounter for palliative care: Secondary | ICD-10-CM | POA: Diagnosis not present

## 2023-09-17 DIAGNOSIS — Z66 Do not resuscitate: Secondary | ICD-10-CM

## 2023-09-17 DIAGNOSIS — E871 Hypo-osmolality and hyponatremia: Secondary | ICD-10-CM | POA: Diagnosis present

## 2023-09-17 DIAGNOSIS — C7931 Secondary malignant neoplasm of brain: Principal | ICD-10-CM | POA: Diagnosis present

## 2023-09-17 DIAGNOSIS — Z7189 Other specified counseling: Secondary | ICD-10-CM | POA: Diagnosis not present

## 2023-09-17 DIAGNOSIS — Z8249 Family history of ischemic heart disease and other diseases of the circulatory system: Secondary | ICD-10-CM

## 2023-09-17 DIAGNOSIS — I10 Essential (primary) hypertension: Secondary | ICD-10-CM | POA: Diagnosis present

## 2023-09-17 DIAGNOSIS — F419 Anxiety disorder, unspecified: Secondary | ICD-10-CM | POA: Diagnosis present

## 2023-09-17 DIAGNOSIS — C3490 Malignant neoplasm of unspecified part of unspecified bronchus or lung: Secondary | ICD-10-CM | POA: Diagnosis not present

## 2023-09-17 DIAGNOSIS — F32A Depression, unspecified: Secondary | ICD-10-CM | POA: Diagnosis present

## 2023-09-17 DIAGNOSIS — D869 Sarcoidosis, unspecified: Secondary | ICD-10-CM | POA: Diagnosis present

## 2023-09-17 DIAGNOSIS — Z923 Personal history of irradiation: Secondary | ICD-10-CM

## 2023-09-17 DIAGNOSIS — E872 Acidosis, unspecified: Secondary | ICD-10-CM | POA: Diagnosis present

## 2023-09-17 DIAGNOSIS — Z8269 Family history of other diseases of the musculoskeletal system and connective tissue: Secondary | ICD-10-CM

## 2023-09-17 DIAGNOSIS — J309 Allergic rhinitis, unspecified: Secondary | ICD-10-CM | POA: Diagnosis present

## 2023-09-17 DIAGNOSIS — F05 Delirium due to known physiological condition: Secondary | ICD-10-CM | POA: Diagnosis present

## 2023-09-17 DIAGNOSIS — Z170 Estrogen receptor positive status [ER+]: Secondary | ICD-10-CM

## 2023-09-17 DIAGNOSIS — R4701 Aphasia: Secondary | ICD-10-CM | POA: Diagnosis present

## 2023-09-17 DIAGNOSIS — R4182 Altered mental status, unspecified: Secondary | ICD-10-CM

## 2023-09-17 DIAGNOSIS — Z87891 Personal history of nicotine dependence: Secondary | ICD-10-CM

## 2023-09-17 DIAGNOSIS — Z825 Family history of asthma and other chronic lower respiratory diseases: Secondary | ICD-10-CM

## 2023-09-17 DIAGNOSIS — C7951 Secondary malignant neoplasm of bone: Secondary | ICD-10-CM | POA: Diagnosis present

## 2023-09-17 DIAGNOSIS — C50411 Malignant neoplasm of upper-outer quadrant of right female breast: Secondary | ICD-10-CM | POA: Diagnosis present

## 2023-09-17 DIAGNOSIS — Z90710 Acquired absence of both cervix and uterus: Secondary | ICD-10-CM

## 2023-09-17 DIAGNOSIS — Z9981 Dependence on supplemental oxygen: Secondary | ICD-10-CM

## 2023-09-17 DIAGNOSIS — Z79899 Other long term (current) drug therapy: Secondary | ICD-10-CM

## 2023-09-17 DIAGNOSIS — I159 Secondary hypertension, unspecified: Secondary | ICD-10-CM | POA: Diagnosis not present

## 2023-09-17 DIAGNOSIS — J449 Chronic obstructive pulmonary disease, unspecified: Secondary | ICD-10-CM | POA: Diagnosis present

## 2023-09-17 DIAGNOSIS — R29810 Facial weakness: Secondary | ICD-10-CM | POA: Diagnosis present

## 2023-09-17 DIAGNOSIS — R410 Disorientation, unspecified: Secondary | ICD-10-CM | POA: Diagnosis present

## 2023-09-17 DIAGNOSIS — K219 Gastro-esophageal reflux disease without esophagitis: Secondary | ICD-10-CM | POA: Diagnosis present

## 2023-09-17 DIAGNOSIS — E876 Hypokalemia: Secondary | ICD-10-CM | POA: Diagnosis present

## 2023-09-17 DIAGNOSIS — D573 Sickle-cell trait: Secondary | ICD-10-CM | POA: Diagnosis present

## 2023-09-17 DIAGNOSIS — J9611 Chronic respiratory failure with hypoxia: Secondary | ICD-10-CM | POA: Diagnosis present

## 2023-09-17 DIAGNOSIS — J9612 Chronic respiratory failure with hypercapnia: Secondary | ICD-10-CM | POA: Diagnosis present

## 2023-09-17 DIAGNOSIS — Z7982 Long term (current) use of aspirin: Secondary | ICD-10-CM

## 2023-09-17 DIAGNOSIS — Z885 Allergy status to narcotic agent status: Secondary | ICD-10-CM

## 2023-09-17 DIAGNOSIS — K7460 Unspecified cirrhosis of liver: Secondary | ICD-10-CM | POA: Diagnosis present

## 2023-09-17 DIAGNOSIS — G9341 Metabolic encephalopathy: Secondary | ICD-10-CM | POA: Diagnosis present

## 2023-09-17 DIAGNOSIS — R918 Other nonspecific abnormal finding of lung field: Secondary | ICD-10-CM | POA: Diagnosis not present

## 2023-09-17 DIAGNOSIS — Z7952 Long term (current) use of systemic steroids: Secondary | ICD-10-CM

## 2023-09-17 DIAGNOSIS — R569 Unspecified convulsions: Secondary | ICD-10-CM | POA: Diagnosis present

## 2023-09-17 DIAGNOSIS — C3491 Malignant neoplasm of unspecified part of right bronchus or lung: Secondary | ICD-10-CM

## 2023-09-17 DIAGNOSIS — Z853 Personal history of malignant neoplasm of breast: Secondary | ICD-10-CM

## 2023-09-17 DIAGNOSIS — D509 Iron deficiency anemia, unspecified: Secondary | ICD-10-CM | POA: Diagnosis present

## 2023-09-17 DIAGNOSIS — Z83438 Family history of other disorder of lipoprotein metabolism and other lipidemia: Secondary | ICD-10-CM

## 2023-09-17 DIAGNOSIS — Z9221 Personal history of antineoplastic chemotherapy: Secondary | ICD-10-CM

## 2023-09-17 DIAGNOSIS — G9389 Other specified disorders of brain: Secondary | ICD-10-CM | POA: Diagnosis not present

## 2023-09-17 LAB — I-STAT CHEM 8, ED
BUN: 11 mg/dL (ref 8–23)
Calcium, Ion: 1.01 mmol/L — ABNORMAL LOW (ref 1.15–1.40)
Chloride: 90 mmol/L — ABNORMAL LOW (ref 98–111)
Creatinine, Ser: 0.9 mg/dL (ref 0.44–1.00)
Glucose, Bld: 147 mg/dL — ABNORMAL HIGH (ref 70–99)
HCT: 36 % (ref 36.0–46.0)
Hemoglobin: 12.2 g/dL (ref 12.0–15.0)
Potassium: 3 mmol/L — ABNORMAL LOW (ref 3.5–5.1)
Sodium: 141 mmol/L (ref 135–145)
TCO2: 39 mmol/L — ABNORMAL HIGH (ref 22–32)

## 2023-09-17 LAB — CBC WITH DIFFERENTIAL/PLATELET
Abs Immature Granulocytes: 0.07 10*3/uL (ref 0.00–0.07)
Basophils Absolute: 0 10*3/uL (ref 0.0–0.1)
Basophils Relative: 0 %
Eosinophils Absolute: 0.1 10*3/uL (ref 0.0–0.5)
Eosinophils Relative: 0 %
HCT: 36.7 % (ref 36.0–46.0)
Hemoglobin: 11.3 g/dL — ABNORMAL LOW (ref 12.0–15.0)
Immature Granulocytes: 0 %
Lymphocytes Relative: 5 %
Lymphs Abs: 0.8 10*3/uL (ref 0.7–4.0)
MCH: 26.2 pg (ref 26.0–34.0)
MCHC: 30.8 g/dL (ref 30.0–36.0)
MCV: 85 fL (ref 80.0–100.0)
Monocytes Absolute: 1.3 10*3/uL — ABNORMAL HIGH (ref 0.1–1.0)
Monocytes Relative: 9 %
Neutro Abs: 13.5 10*3/uL — ABNORMAL HIGH (ref 1.7–7.7)
Neutrophils Relative %: 86 %
Platelets: 192 10*3/uL (ref 150–400)
RBC: 4.32 MIL/uL (ref 3.87–5.11)
RDW: 17.9 % — ABNORMAL HIGH (ref 11.5–15.5)
WBC: 15.7 10*3/uL — ABNORMAL HIGH (ref 4.0–10.5)
nRBC: 0 % (ref 0.0–0.2)

## 2023-09-17 LAB — COMPREHENSIVE METABOLIC PANEL
ALT: 14 U/L (ref 0–44)
AST: 30 U/L (ref 15–41)
Albumin: 3.2 g/dL — ABNORMAL LOW (ref 3.5–5.0)
Alkaline Phosphatase: 56 U/L (ref 38–126)
Anion gap: 18 — ABNORMAL HIGH (ref 5–15)
BUN: 10 mg/dL (ref 8–23)
CO2: 35 mmol/L — ABNORMAL HIGH (ref 22–32)
Calcium: 8.7 mg/dL — ABNORMAL LOW (ref 8.9–10.3)
Chloride: 93 mmol/L — ABNORMAL LOW (ref 98–111)
Creatinine, Ser: 1.04 mg/dL — ABNORMAL HIGH (ref 0.44–1.00)
GFR, Estimated: >60 mL/min (ref >60)
Glucose, Bld: 145 mg/dL — ABNORMAL HIGH (ref 70–99)
Potassium: 3.2 mmol/L — ABNORMAL LOW (ref 3.5–5.1)
Sodium: 146 mmol/L — ABNORMAL HIGH (ref 135–145)
Total Bilirubin: 1.4 mg/dL — ABNORMAL HIGH (ref <1.2)
Total Protein: 6.3 g/dL — ABNORMAL LOW (ref 6.5–8.1)

## 2023-09-17 LAB — URINALYSIS, ROUTINE W REFLEX MICROSCOPIC
Bilirubin Urine: NEGATIVE
Glucose, UA: NEGATIVE mg/dL
Ketones, ur: NEGATIVE mg/dL
Leukocytes,Ua: NEGATIVE
Nitrite: NEGATIVE
Protein, ur: 100 mg/dL — AB
Specific Gravity, Urine: 1.015 (ref 1.005–1.030)
pH: 5 (ref 5.0–8.0)

## 2023-09-17 LAB — I-STAT VENOUS BLOOD GAS, ED
Acid-Base Excess: 15 mmol/L — ABNORMAL HIGH (ref 0.0–2.0)
Bicarbonate: 42.3 mmol/L — ABNORMAL HIGH (ref 20.0–28.0)
Calcium, Ion: 1.02 mmol/L — ABNORMAL LOW (ref 1.15–1.40)
HCT: 37 % (ref 36.0–46.0)
Hemoglobin: 12.6 g/dL (ref 12.0–15.0)
O2 Saturation: 94 %
Potassium: 3.1 mmol/L — ABNORMAL LOW (ref 3.5–5.1)
Sodium: 141 mmol/L (ref 135–145)
TCO2: 44 mmol/L — ABNORMAL HIGH (ref 22–32)
pCO2, Ven: 63 mm[Hg] — ABNORMAL HIGH (ref 44–60)
pH, Ven: 7.435 — ABNORMAL HIGH (ref 7.25–7.43)
pO2, Ven: 70 mm[Hg] — ABNORMAL HIGH (ref 32–45)

## 2023-09-17 LAB — CREATININE, SERUM
Creatinine, Ser: 1 mg/dL (ref 0.44–1.00)
GFR, Estimated: >60 mL/min (ref >60)

## 2023-09-17 LAB — CBC
HCT: 36.5 % (ref 36.0–46.0)
Hemoglobin: 11.5 g/dL — ABNORMAL LOW (ref 12.0–15.0)
MCH: 26.2 pg (ref 26.0–34.0)
MCHC: 31.5 g/dL (ref 30.0–36.0)
MCV: 83.1 fL (ref 80.0–100.0)
Platelets: 162 10*3/uL (ref 150–400)
RBC: 4.39 MIL/uL (ref 3.87–5.11)
RDW: 17.8 % — ABNORMAL HIGH (ref 11.5–15.5)
WBC: 10.7 10*3/uL — ABNORMAL HIGH (ref 4.0–10.5)
nRBC: 0 % (ref 0.0–0.2)

## 2023-09-17 LAB — RAPID URINE DRUG SCREEN, HOSP PERFORMED
Amphetamines: NOT DETECTED
Barbiturates: NOT DETECTED
Benzodiazepines: NOT DETECTED
Cocaine: NOT DETECTED
Opiates: NOT DETECTED
Tetrahydrocannabinol: NOT DETECTED

## 2023-09-17 LAB — ETHANOL: Alcohol, Ethyl (B): <10 mg/dL (ref <10)

## 2023-09-17 LAB — LACTIC ACID, PLASMA
Lactic Acid, Venous: 0.9 mmol/L (ref 0.5–1.9)
Lactic Acid, Venous: 1.1 mmol/L (ref 0.5–1.9)

## 2023-09-17 LAB — CBG MONITORING, ED: Glucose-Capillary: 142 mg/dL — ABNORMAL HIGH (ref 70–99)

## 2023-09-17 LAB — AMMONIA: Ammonia: 40 umol/L — ABNORMAL HIGH (ref 9–35)

## 2023-09-17 LAB — I-STAT CG4 LACTIC ACID, ED: Lactic Acid, Venous: 3 mmol/L (ref 0.5–1.9)

## 2023-09-17 MED ORDER — LEVETIRACETAM IN NACL 500 MG/100ML IV SOLN
500.0000 mg | Freq: Two times a day (BID) | INTRAVENOUS | Status: DC
  Administered 2023-09-18 (×2): 500 mg via INTRAVENOUS
  Filled 2023-09-17 (×2): qty 100

## 2023-09-17 MED ORDER — ENOXAPARIN SODIUM 40 MG/0.4ML IJ SOSY
40.0000 mg | PREFILLED_SYRINGE | INTRAMUSCULAR | Status: DC
  Administered 2023-09-17 – 2023-09-21 (×5): 40 mg via SUBCUTANEOUS
  Filled 2023-09-17 (×5): qty 0.4

## 2023-09-17 MED ORDER — DEXAMETHASONE SODIUM PHOSPHATE 10 MG/ML IJ SOLN
10.0000 mg | Freq: Once | INTRAMUSCULAR | Status: AC
Start: 2023-09-17 — End: 2023-09-17

## 2023-09-17 MED ORDER — DEXAMETHASONE SODIUM PHOSPHATE 10 MG/ML IJ SOLN
INTRAMUSCULAR | Status: AC
  Administered 2023-09-17: 10 mg via INTRAVENOUS
  Filled 2023-09-17: qty 1

## 2023-09-17 MED ORDER — LEVETIRACETAM IN NACL 1500 MG/100ML IV SOLN
1500.0000 mg | Freq: Once | INTRAVENOUS | Status: AC
  Administered 2023-09-17: 1500 mg via INTRAVENOUS
  Filled 2023-09-17: qty 100

## 2023-09-17 MED ORDER — POTASSIUM CHLORIDE 10 MEQ/100ML IV SOLN
10.0000 meq | INTRAVENOUS | Status: AC
  Administered 2023-09-17 – 2023-09-18 (×2): 10 meq via INTRAVENOUS
  Filled 2023-09-17 (×2): qty 100

## 2023-09-17 MED ORDER — LACTATED RINGERS IV SOLN: INTRAVENOUS | Status: AC

## 2023-09-17 MED ORDER — ASPIRIN 81 MG PO TBEC: 81.0000 mg | DELAYED_RELEASE_TABLET | Freq: Every day | ORAL | Status: DC

## 2023-09-17 MED ORDER — IPRATROPIUM-ALBUTEROL 0.5-2.5 (3) MG/3ML IN SOLN: 3.0000 mL | Freq: Four times a day (QID) | RESPIRATORY_TRACT | Status: DC | PRN

## 2023-09-17 MED ORDER — ASPIRIN 300 MG RE SUPP
300.0000 mg | Freq: Every day | RECTAL | Status: DC
  Administered 2023-09-18: 300 mg via RECTAL
  Filled 2023-09-17 (×2): qty 1

## 2023-09-17 MED ORDER — IPRATROPIUM-ALBUTEROL 0.5-2.5 (3) MG/3ML IN SOLN
3.0000 mL | Freq: Once | RESPIRATORY_TRACT | Status: AC
  Administered 2023-09-17: 3 mL via RESPIRATORY_TRACT
  Filled 2023-09-17: qty 3

## 2023-09-17 MED ORDER — METOPROLOL TARTRATE 5 MG/5ML IV SOLN: 5.0000 mg | Freq: Four times a day (QID) | INTRAVENOUS | Status: DC | PRN

## 2023-09-17 MED ORDER — LORAZEPAM 2 MG/ML IJ SOLN
2.0000 mg | Freq: Once | INTRAMUSCULAR | Status: AC
Start: 2023-09-17 — End: 2023-09-17

## 2023-09-17 MED ORDER — DEXAMETHASONE SODIUM PHOSPHATE 4 MG/ML IJ SOLN
4.0000 mg | Freq: Four times a day (QID) | INTRAMUSCULAR | Status: DC
  Administered 2023-09-18 – 2023-09-19 (×7): 4 mg via INTRAVENOUS
  Filled 2023-09-17 (×8): qty 1

## 2023-09-17 MED ORDER — GADOBUTROL 1 MMOL/ML IV SOLN
7.0000 mL | Freq: Once | INTRAVENOUS | Status: AC | PRN
Start: 2023-09-17 — End: 2023-09-17
  Administered 2023-09-17: 7 mL via INTRAVENOUS

## 2023-09-17 MED ORDER — DEXAMETHASONE SODIUM PHOSPHATE 10 MG/ML IJ SOLN
10.0000 mg | Freq: Four times a day (QID) | INTRAMUSCULAR | Status: DC
  Administered 2023-09-17: 10 mg via INTRAVENOUS
  Filled 2023-09-17: qty 1

## 2023-09-17 MED ORDER — LORAZEPAM 2 MG/ML IJ SOLN
INTRAMUSCULAR | Status: AC
  Administered 2023-09-17: 2 mg via INTRAVENOUS
  Filled 2023-09-17: qty 1

## 2023-09-17 MED ORDER — PANTOPRAZOLE SODIUM 40 MG IV SOLR
40.0000 mg | INTRAVENOUS | Status: DC
  Administered 2023-09-17 – 2023-09-18 (×2): 40 mg via INTRAVENOUS
  Filled 2023-09-17 (×2): qty 10

## 2023-09-17 MED ORDER — SODIUM CHLORIDE 0.9 % IV SOLN
1.0000 g | INTRAVENOUS | Status: DC
  Administered 2023-09-17: 1 g via INTRAVENOUS
  Filled 2023-09-17: qty 10

## 2023-09-17 NOTE — Progress Notes (Signed)
Patient not available for EEG at the moment, (OTF to MRI then rm) will check with inpatient nurse once on assigned floor.

## 2023-09-17 NOTE — Telephone Encounter (Signed)
Called and spoke with pt's daughter about appts today. She reports her mom is not feeling well today and was aware of the appts.  Daughter reports pt is not eating and drinking like she should be and has to encourage her, fatigue, headache all weekend long. Per Dr. Kerry Fort, reschedule appts and if pt doesn't feel any better to go to the ED.  Informed pt's daughter to call us with any concerns or questions.   Sending scheduling a message to reschedule appts.

## 2023-09-17 NOTE — ED Triage Notes (Signed)
Pt from home with [facility] for AMS. Pt was LSN at 8pm last night when she went to bed. Daughter went to check on pt around 1030am this morning and noticed she was altered, nonverbal, drooling and has a right sided facial droop. Pt was c.o headache yesterday.   Pt arrives to ED alert, sitting up, drooling, nonverbal.

## 2023-09-17 NOTE — ED Notes (Signed)
Pt transported to MRI then going to room

## 2023-09-17 NOTE — Progress Notes (Signed)
EEG complete - results pending 

## 2023-09-17 NOTE — ED Provider Notes (Signed)
[facility] EMERGENCY DEPARTMENT AT [facility] Provider Note   CSN: [id number] Arrival date & time: 09/17/23  1129     History  Chief Complaint  Patient presents with   Altered Mental Status   Facial Droop    [name] is a 62 y.o. female.  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  Family noticed that she was last normal around 8 PM last night, they noticed that she is completely aphasic which is a new change compared to her baseline, they noticed some right sided facial droop.  These changes were acute and new as of last night.  No known fevers.  The patient was then brought by EMS to the emergency department where she was found with eyes tracking bilaterally, aphasic, nonverbal, sonorous respirations present.  Arrives GCS 6 however I spoke with the patient's daughter over the phone who confirmed that she is a DNR.  She was placed on a nonrebreather.  Home Medications Prior to Admission medications   Medication Sig Start Date End Date Taking? Authorizing Provider  acetaminophen (TYLENOL) 325 MG tablet Take 650 mg by mouth every 6 (six) hours as needed for mild pain or moderate pain.   Yes [provider]  albuterol (PROVENTIL) (2.5 MG/3ML) 0.083% nebulizer solution Take 3 mLs (2.5 mg total) by nebulization every 6 (six) hours as needed for wheezing or shortness of breath. 12/22/18  Yes Osvaldo Shipper, MD  albuterol (VENTOLIN HFA) 108 (90 Base) MCG/ACT inhaler INHALE 2 PUFFS EVERY 6 HOURS AS NEEDED FOR WHEEZING OR SHORTNESS OF BREATH Patient taking differently: Inhale 2 puffs into the lungs every 6 (six) hours as needed for shortness of breath or wheezing. 09/23/19  Yes Nyoka Cowden, MD  aspirin EC 81 MG tablet Take 1  tablet (81 mg total) by mouth daily. Start 02/23/19 02/21/19  Yes Pokhrel, Laxman, MD  atorvastatin (LIPITOR) 20 MG tablet TAKE 1 TABLET (20 MG TOTAL) BY MOUTH DAILY. 03/28/21  Yes Sandre Kitty, MD  dexamethasone (DECADRON) 4 MG tablet Take 1 tablet (4 mg total) by mouth 2 (two) times daily. 09/01/23  Yes Danford, Earl Lites, MD  fluticasone (FLONASE) 50 MCG/ACT nasal spray USE 2 SPRAYS IN BOTH  NOSTRILS DAILY AS NEEDED  FOR ALLERGIES Patient taking differently: Place 2 sprays into both nostrils daily as needed for allergies. 08/15/21  Yes Nyoka Cowden, MD  hydroxychloroquine (PLAQUENIL) 200 MG tablet TAKE 1 TABLET BY MOUTH DAILY 03/19/23  Yes Nyoka Cowden, MD  lidocaine-prilocaine (EMLA) cream Apply to affected area once Patient taking differently: Apply 1 Application topically See admin instructions. Apply to affected area once as directed 08/01/23  Yes Si Gaul, MD  metoprolol tartrate (LOPRESSOR) 25 MG tablet Take 1 tablet (25 mg total) by mouth daily. 07/09/23  Yes Leslye Peer, MD  montelukast (SINGULAIR) 10 MG tablet Take 10 mg by mouth at bedtime. 07/31/22  Yes [provider]  Multiple Vitamin (MULTIVITAMIN) capsule Take 1 capsule by mouth daily with breakfast.   Yes [provider]  omeprazole (PRILOSEC) 20 MG capsule TAKE 1 CAPSULE EVERY DAY Patient taking differently: Take 20 mg by mouth daily before breakfast. 01/03/21  Yes Sandre Kitty, MD  ondansetron (ZOFRAN) 4 MG tablet Take 1 tablet (4 mg total) by mouth every  6 (six) hours as needed for nausea. 06/28/23  Yes Alwyn Ren, MD  prochlorperazine (COMPAZINE) 10 MG tablet Take 1 tablet (10 mg total) by mouth every 6 (six) hours as needed for nausea or vomiting. 08/01/23  Yes Si Gaul, MD  sertraline (ZOLOFT) 50 MG tablet TAKE 1 TABLET EVERY DAY Patient taking differently: Take 50 mg by mouth daily. 09/11/20  Yes Sandre Kitty, MD  STIOLTO RESPIMAT 2.5-2.5 MCG/ACT AERS USE 2 INHALATIONS  BY MOUTH DAILY 09/17/23  Yes Nyoka Cowden, MD  dexamethasone (DECADRON) 4 MG tablet Take 1 tablet (4 mg total) by mouth 2 (two) times daily for 7 days, THEN 0.5 tablets (2 mg total) 2 (two) times daily for 7 days, THEN 0.5 tablets (2 mg total) daily for 7 days, THEN 0.5 tablets (2 mg total) every other day for 7 days. Patient not taking: Reported on 09/17/2023 09/10/23 10/08/23  Erven Colla, PA-C  oxyCODONE (ROXICODONE) 5 MG immediate release tablet Take 1 tablet (5 mg total) by mouth every 8 (eight) hours as needed. Patient not taking: Reported on 09/17/2023 09/01/23 08/31/24  Alberteen Sam, MD  OXYGEN Inhale 2-3 L/min into the lungs See admin instructions. Inhale 2-3 L/min into the lungs CONTINUOUSLY; 2 L/min at rest and 3 L/min if exerted    [provider]      Allergies    Codeine    Review of Systems   Review of Systems  Unable to perform ROS: Mental status change    Physical Exam Updated Vital Signs BP 117/79   Pulse 100   Temp 99.8 F (37.7 C) (Rectal)   Resp 19   Ht 5\' 5"  (1.651 m)   Wt 74.4 kg   SpO2 100%   BMI 27.29 kg/m  Physical Exam Vitals and nursing note reviewed.  Constitutional:      General: She is in acute distress.     Comments: GCS 6, sonorous respirations  HENT:     Head: Normocephalic and atraumatic.  Eyes:     Conjunctiva/sclera: Conjunctivae normal.     Pupils: Pupils are equal, round, and reactive to light.  Cardiovascular:     Rate and Rhythm: Regular rhythm. Tachycardia present.  Pulmonary:     Effort: Respiratory distress present.     Breath sounds: Rhonchi present.  Abdominal:     General: There is no distension.     Tenderness: There is no abdominal tenderness. There is no guarding.  Musculoskeletal:        General: No deformity or signs of injury.     Cervical back: Neck supple.  Skin:    Findings: No lesion or rash.  Neurological:     Comments: Right-sided facial droop, patient does not appear to be seizing as  her eyes are tracking bilaterally with purposeful movements, pupils reactive bilaterally, responds to painful stimuli all extremities     ED Results / Procedures / Treatments   Labs (all labs ordered are listed, but only abnormal results are displayed) Labs Reviewed  COMPREHENSIVE METABOLIC PANEL - Abnormal; Notable for the following components:      Result Value   Sodium 146 (*)    Potassium 3.2 (*)    Chloride 93 (*)    CO2 35 (*)    Glucose, Bld 145 (*)    Creatinine, Ser 1.04 (*)    Calcium 8.7 (*)    Total Protein 6.3 (*)    Albumin 3.2 (*)    Total Bilirubin 1.4 (*)  Anion gap 18 (*)    All other components within normal limits  CBC WITH DIFFERENTIAL/PLATELET - Abnormal; Notable for the following components:   WBC 15.7 (*)    Hemoglobin 11.3 (*)    RDW 17.9 (*)    Neutro Abs 13.5 (*)    Monocytes Absolute 1.3 (*)    All other components within normal limits  AMMONIA - Abnormal; Notable for the following components:   Ammonia 40 (*)    All other components within normal limits  URINALYSIS, ROUTINE W REFLEX MICROSCOPIC - Abnormal; Notable for the following components:   APPearance HAZY (*)    Hgb urine dipstick MODERATE (*)    Protein, ur 100 (*)    Bacteria, UA RARE (*)    All other components within normal limits  CBG MONITORING, ED - Abnormal; Notable for the following components:   Glucose-Capillary 142 (*)    All other components within normal limits  I-STAT CG4 LACTIC ACID, ED - Abnormal; Notable for the following components:   Lactic Acid, Venous 3.0 (*)    All other components within normal limits  I-STAT CHEM 8, ED - Abnormal; Notable for the following components:   Potassium 3.0 (*)    Chloride 90 (*)    Glucose, Bld 147 (*)    Calcium, Ion 1.01 (*)    TCO2 39 (*)    All other components within normal limits  I-STAT VENOUS BLOOD GAS, ED - Abnormal; Notable for the following components:   pH, Ven 7.435 (*)    pCO2, Ven 63.0 (*)    pO2, Ven 70 (*)     Bicarbonate 42.3 (*)    TCO2 44 (*)    Acid-Base Excess 15.0 (*)    Potassium 3.1 (*)    Calcium, Ion 1.02 (*)    All other components within normal limits  CULTURE, BLOOD (ROUTINE X 2)  CULTURE, BLOOD (ROUTINE X 2)  RAPID URINE DRUG SCREEN, HOSP PERFORMED  ETHANOL    EKG EKG Interpretation Date/Time:  Monday September 17 2023 13:23:27 EST Ventricular Rate:  101 PR Interval:  132 QRS Duration:  82 QT Interval:  369 QTC Calculation: 479 R Axis:   206  Text Interpretation: Right and left arm electrode reversal, interpretation assumes no reversal Sinus tachycardia Biatrial enlargement Probable lateral infarct, age indeterminate Confirmed by Ernie Avena ([id number]) on 09/17/2023 2:23:39 PM  Radiology DG Chest Port 1 View  Result Date: 09/17/2023 CLINICAL DATA:  Altered mental status.  Lung cancer. EXAM: PORTABLE CHEST 1 VIEW COMPARISON:  08/27/2023. FINDINGS: There is a right chest wall port a catheter with tip in the distal SVC. Stable cardiomediastinal contours. The right upper lobe treated lung mass is again noted with signs of central cavitary change. There is asymmetric right lung volume loss with increase opacification in the right lower lung compared with the previous exam. Left lung appears clear. IMPRESSION: 1. Increased opacification in the right lower lung compared with the previous exam. Atelectasis and or pleural effusion. Underlying pneumonia would be difficult to exclude. 2. Stable appearance of right upper lobe lung mass with central cavitation. Electronically Signed   By: Signa Kell M.D.   On: 09/17/2023 15:41   CT HEAD WO CONTRAST  Result Date: 09/17/2023 CLINICAL DATA:  Mental status change, unknown cause EXAM: CT HEAD WITHOUT CONTRAST TECHNIQUE: Contiguous axial images were obtained from the base of the skull through the vertex without intravenous contrast. RADIATION DOSE REDUCTION: This exam was performed according to the departmental dose-optimization program  which  includes automated exposure control, adjustment of the mA and/or kV according to patient size and/or use of iterative reconstruction technique. COMPARISON:  Brain MR 08/29/23 FINDINGS: Brain: No hemorrhage. No hydrocephalus. No extra-axial fluid collection. No CT evidence of an acute cortical infarct. 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 There is no definite evidence of new metastatic disease on this noncontrast enhanced exam. Vascular: No hyperdense vessel or unexpected calcification. Skull: Normal. Negative for fracture or focal lesion. Sinuses/Orbits: No acute finding. Other: None. IMPRESSION: 1. No CT evidence of an acute cortical infarct or intracranial hemorrhage. 2. Redemonstrated vasogenic edema at sites of known metastatic disease in the anterior left temporal lobe and in the left occipital lobe. There is no definite evidence of new metastatic disease on this noncontrast enhanced exam. If more detailed characterization for new metastatic disease is clinically warranted, further evaluation with repeat brain MRI recommended. Electronically Signed   By: Lorenza Cambridge M.D.   On: 09/17/2023 14:59    Procedures .Critical Care  Performed by: Ernie Avena, MD Authorized by: Ernie Avena, MD   Critical care provider statement:    Critical care time (minutes):  44   Critical care was time spent personally by me on the following activities:  Development of treatment plan with patient or surrogate, discussions with consultants, evaluation of patient's response to treatment, examination of patient, ordering and review of laboratory studies, ordering and review of radiographic studies, ordering and performing treatments and interventions, pulse oximetry, re-evaluation of patient's condition and review of old charts   Care discussed with: admitting provider       Medications Ordered in ED Medications   levETIRAcetam (KEPPRA) IVPB 1500 mg/ 100 mL premix (has no administration in time range)  levETIRAcetam (KEPPRA) IVPB 500 mg/100 mL premix (has no administration in time range)  dexamethasone (DECADRON) injection 10 mg (has no administration in time range)  LORazepam (ATIVAN) injection 2 mg (2 mg Intravenous Given 09/17/23 1139)  dexamethasone (DECADRON) injection 10 mg (10 mg Intravenous Given 09/17/23 1216)  ipratropium-albuterol (DUONEB) 0.5-2.5 (3) MG/3ML nebulizer solution 3 mL (3 mLs Nebulization Given 09/17/23 1331)    ED Course/ Medical Decision Making/ A&P                                 Medical Decision Making Amount and/or Complexity of Data Reviewed Labs: ordered. Radiology: ordered.  Risk Prescription drug management. Decision regarding hospitalization.    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  Family noticed that she was last normal around 8 PM last night, they noticed that she is completely aphasic which is a new change compared to her baseline, they noticed some right sided facial droop.  These changes were acute and new as of last night.  No known fevers.  The patient was then brought by EMS to the emergency department where she was found with eyes tracking bilaterally, aphasic, nonverbal, sonorous respirations present.  Arrives GCS 6 however I spoke with the patient's daughter over the phone who confirmed that she is a DNR.  She was placed on a nonrebreather.  On arrival, the patient was afebrile, tachycardic, hemodynamically stable, saturating 98% on  nasal cannula.  She arrives with sonorous respirations and I did have initial concern for aspiration given her change in mental status.    CBG on arrival was 142, ethanol levels normal,  CMP revealed mild electrolyte abnormalities with a sodium of 146, potassium 3.2, creatinine at baseline, LFTs normal, CBC with a nonspecific leukocytosis to 15.7, VBG did not reveal significant respiratory acidosis with a pH of 7.44, HCO3 42 and pCO2 of 63.  Urinalysis unconvincing for UTI, rare bacteria present, low 0-5 WBCs and a UDS was negative.  Given the patient's known history of metastatic lesions to the brain, the patient was administered 10 mg of IV Decadron.    CT head was obtained and revealed the following: IMPRESSION:  1. No CT evidence of an acute cortical infarct or intracranial  hemorrhage.  2. Redemonstrated vasogenic edema at sites of known metastatic  disease in the anterior left temporal lobe and in the left occipital  lobe. There is no definite evidence of new metastatic disease on  this noncontrast enhanced exam. If more detailed characterization  for new metastatic disease is clinically warranted, further  evaluation with repeat brain MRI recommended.   CXR: IMPRESSION:  1. Increased opacification in the right lower lung compared with the  previous exam. Atelectasis and or pleural effusion. Underlying  pneumonia would be difficult to exclude.  2. Stable appearance of right upper lobe lung mass with central  cavitation.     Radiation oncology consulted. Neurology consulted, Dr. Selina Cooley will see the patient in consultation.  Spoke with Dr. [name] of on-call neurosurgery who agreed with the plan of care and recommends Decadron 10 mg every 6 hours.  To admission on repeat assessment, the patient remains nonverbal however.  To be protecting her airway, appeared more comfortable, no longer in distress, remains DNR, hospitalist medicine consulted for admission, Dr. Lajuana Ripple accepting.   Final Clinical Impression(s) / ED Diagnoses Final diagnoses:  Altered mental status, unspecified altered mental status type  DNR (do not resuscitate)  NSCLC metastatic to brain [facility])   Vasogenic brain edema [facility])    Rx / DC Orders ED Discharge Orders     None         Ernie Avena, MD 09/17/23 1608

## 2023-09-17 NOTE — ED Notes (Signed)
Patient transported to CT by myself with no issue. Patient did become more arousable when moving her in CT, and remains more responsive.

## 2023-09-17 NOTE — Consult Note (Signed)
NEUROLOGY CONSULT NOTE   Date of service: September 17, 2023 Patient Name: [name] MRN:  [id number] DOB:  [birth year] Chief Complaint: "None" Requesting Provider: Ernie Avena, MD  History of Present Illness  [name] is a 62 y.o. 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  Patient's family states that she is normally alert and oriented x 4 with some forgetfulness since she was diagnosed with a brain metastases, ambulatory and able to care for herself at baseline.  She has been feeling unwell and has been easily fatigued over the past few days though.  Yesterday, she was able to carefully resolve although fatigued.  This morning when she awoke, she was nonverbal, nonresponsive had bilateral hand tremors and facial droop on the right with drooling from the right side of the mouth.  CT head reveals vasogenic edema at sites of known brain mets in anterior left temporal lobe and left occipital lobe.  On arrival to the ED, she was given 2 mg of Ativan IV and loaded with Keppra.  On exam today, patient arouses to loud voice and touch and grimaces to sternal rub but does not localize.  She is able to intermittently follow simple commands but is nonverbal.  Face appears symmetrical, and no shaking or twitching movements noted.   ROS   Unable to ascertain due to altered mental status.  Past History   Past Medical History:  Diagnosis Date   Acute on chronic respiratory failure (HCC) 10/09/2016   Allergic rhinitis    Breast cancer (HCC) 2016   right breast   COPD, mild (HCC) FOLLOWED BY DR WCBJ   Depression    GERD (gastroesophageal reflux disease)    HTN (hypertension)    Hypertension    Iron deficiency anemia     Long-term current use of steroids SYMBICORT INHALER   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    Overweight (BMI 25.0-29.9) 05/11/2016   Palpitations    Personal history of radiation therapy 2016   Sarcoidosis STABLE PER CXR JUNE 2013   Shortness of breath    Sickle cell trait (HCC)     Past Surgical History:  Procedure Laterality Date   ADJUSTABLE SUTURE MANIPULATION  05/22/2012   Procedure: ADJUSTABLE SUTURE MANIPULATION;  Surgeon: Corinda Gubler, MD;  Location: [facility];  Service: Ophthalmology;  Laterality: Right;   BREAST LUMPECTOMY Right 2016   BRONCHIAL BIOPSY  07/09/2023   Procedure: BRONCHIAL BIOPSIES;  Surgeon: Leslye Peer, MD;  Location[facility] ENDOSCOPY;  Service: Pulmonary;;   BRONCHIAL BRUSHINGS  07/09/2023   Procedure: BRONCHIAL BRUSHINGS;  Surgeon: Leslye Peer, MD;  Location: [facility] ENDOSCOPY;  Service: Pulmonary;;   BRONCHIAL NEEDLE ASPIRATION BIOPSY  07/09/2023   Procedure: BRONCHIAL NEEDLE ASPIRATION BIOPSIES;  Surgeon: Leslye Peer, MD;  Location: [facility] ENDOSCOPY;  Service: Pulmonary;;   CESAREAN SECTION  1986   W/ BILATERAL TUBAL LIGATION   COLONOSCOPY WITH PROPOFOL N/A 02/21/2019   Procedure: COLONOSCOPY WITH PROPOFOL;  Surgeon: Hilarie Fredrickson, MD;  Location: [facility] ENDOSCOPY;  Service: Endoscopy;  Laterality: N/A;   EYE SURGERY     IR IMAGING GUIDED PORT INSERTION  08/01/2023   MEDIAN RECTUS REPAIR  05/22/2012   Procedure: MEDIAN RECTUS REPAIR;  Surgeon: Corinda Gubler, MD;  Location: [facility];  Service: Ophthalmology;  Laterality: Bilateral;  INFERIOR RECTUS RESECTION WITH ADJUSTIBLE SUTURES RIGHT EYE    POLYPECTOMY  02/21/2019   Procedure: POLYPECTOMY;  Surgeon: Hilarie Fredrickson, MD;  Location: [facility] ENDOSCOPY;  Service: Endoscopy;;   RADIOACTIVE SEED GUIDED PARTIAL MASTECTOMY WITH AXILLARY SENTINEL LYMPH NODE BIOPSY Right 08/18/2015   Procedure: RADIOACTIVE SEED GUIDED PARTIAL MASTECTOMY WITH AXILLARY SENTINEL LYMPH NODE BIOPSY;  Surgeon: Almond Lint, MD;   Location: [facility];  Service: General;  Laterality: Right;   TOTAL ROBOTIC ASSISTED LAPAROSCOPIC HYSTERECTOMY  12-30-2010   SYMPTOMATIC UTERINE FIBROIDS   UPPER TEETH EXTRACTION'S  1992   VIDEO BRONCHOSCOPY WITH RADIAL ENDOBRONCHIAL ULTRASOUND  07/09/2023   Procedure: VIDEO BRONCHOSCOPY WITH RADIAL ENDOBRONCHIAL ULTRASOUND;  Surgeon: Leslye Peer, MD;  Location: [facility] ENDOSCOPY;  Service: Pulmonary;;    Family History: Family History  Problem Relation Age of Onset   Hyperlipidemia Mother    Asthma Mother    Lupus Mother    Breast cancer Mother 78   Hypertension Father    Hyperlipidemia Father    Hypertension Sister    Hypertension Brother    Cancer Neg Hx    Diabetes Neg Hx    Coronary artery disease Neg Hx     Social History  reports that she quit smoking about 19 years ago. Her smoking use included cigarettes. She started smoking about 39 years ago. She has a 20 pack-year smoking history. She has never used smokeless tobacco. She reports that she does not drink alcohol and does not use drugs.  Allergies  Allergen Reactions   Codeine Other (See Comments)    Avoids-felt bad when taken before Pt claims she can have this but does not like to take    Medications   Current Facility-Administered Medications:    levETIRAcetam (KEPPRA) IVPB 1500 mg/ 100 mL premix, 1,500 mg, Intravenous, Once, Ernie Avena, MD  Current Outpatient Medications:    acetaminophen (TYLENOL) 325 MG tablet, Take 650 mg by mouth every 6 (six) hours as needed for mild pain or moderate pain., Disp: , Rfl:    albuterol (PROVENTIL) (2.5 MG/3ML) 0.083% nebulizer solution, Take 3 mLs (2.5 mg total) by nebulization every 6 (six) hours as needed for wheezing or shortness of breath., Disp: 75 mL, Rfl: 1   albuterol (VENTOLIN HFA) 108 (90 Base) MCG/ACT inhaler, INHALE 2 PUFFS EVERY 6 HOURS AS NEEDED FOR WHEEZING OR SHORTNESS OF BREATH (Patient taking differently: Inhale 2 puffs into the lungs every  6 (six) hours as needed for shortness of breath or wheezing.), Disp: 54 g, Rfl: 0   aspirin EC 81 MG tablet, Take 1 tablet (81 mg total) by mouth daily. Start 02/23/19, Disp: , Rfl:    atorvastatin (LIPITOR) 20 MG tablet, TAKE 1 TABLET (20 MG TOTAL) BY MOUTH DAILY., Disp: 90 tablet, Rfl: 3   dexamethasone (DECADRON) 4 MG tablet, Take 1 tablet (4 mg total) by mouth 2 (two) times daily., Disp: 60 tablet, Rfl: 0   fluticasone (FLONASE) 50 MCG/ACT nasal spray, USE 2 SPRAYS IN BOTH  NOSTRILS DAILY AS NEEDED  FOR ALLERGIES (Patient taking differently: Place 2 sprays into both nostrils daily as needed for allergies.), Disp: 48 g, Rfl: 11   hydroxychloroquine (PLAQUENIL) 200 MG tablet, TAKE 1 TABLET BY MOUTH DAILY, Disp: 100 tablet, Rfl: 2   lidocaine-prilocaine (EMLA) cream, Apply to affected area once (Patient taking differently: Apply 1 Application topically See admin instructions. Apply to affected area once as directed), Disp: 30 g, Rfl: 3  metoprolol tartrate (LOPRESSOR) 25 MG tablet, Take 1 tablet (25 mg total) by mouth daily., Disp: , Rfl:    montelukast (SINGULAIR) 10 MG tablet, Take 10 mg by mouth at bedtime., Disp: , Rfl:    Multiple Vitamin (MULTIVITAMIN) capsule, Take 1 capsule by mouth daily with breakfast., Disp: , Rfl:    omeprazole (PRILOSEC) 20 MG capsule, TAKE 1 CAPSULE EVERY DAY (Patient taking differently: Take 20 mg by mouth daily before breakfast.), Disp: 90 capsule, Rfl: 1   ondansetron (ZOFRAN) 4 MG tablet, Take 1 tablet (4 mg total) by mouth every 6 (six) hours as needed for nausea., Disp: 20 tablet, Rfl: 0   prochlorperazine (COMPAZINE) 10 MG tablet, Take 1 tablet (10 mg total) by mouth every 6 (six) hours as needed for nausea or vomiting., Disp: 30 tablet, Rfl: 1   sertraline (ZOLOFT) 50 MG tablet, TAKE 1 TABLET EVERY DAY (Patient taking differently: Take 50 mg by mouth daily.), Disp: 90 tablet, Rfl: 1   STIOLTO RESPIMAT 2.5-2.5 MCG/ACT AERS, USE 2 INHALATIONS BY MOUTH DAILY, Disp:  12 g, Rfl: 3   dexamethasone (DECADRON) 4 MG tablet, Take 1 tablet (4 mg total) by mouth 2 (two) times daily for 7 days, THEN 0.5 tablets (2 mg total) 2 (two) times daily for 7 days, THEN 0.5 tablets (2 mg total) daily for 7 days, THEN 0.5 tablets (2 mg total) every other day for 7 days. (Patient not taking: Reported on 09/17/2023), Disp: 26 tablet, Rfl: 0   oxyCODONE (ROXICODONE) 5 MG immediate release tablet, Take 1 tablet (5 mg total) by mouth every 8 (eight) hours as needed. (Patient not taking: Reported on 09/17/2023), Disp: 12 tablet, Rfl: 0   OXYGEN, Inhale 2-3 L/min into the lungs See admin instructions. Inhale 2-3 L/min into the lungs CONTINUOUSLY; 2 L/min at rest and 3 L/min if exerted, Disp: , Rfl:   Vitals   Vitals:   09/17/23 1300 09/17/23 1306 09/17/23 1330 09/17/23 1400  BP: 111/86  (!) 130/100 117/79  Pulse: (!) 102  (!) 101 100  Resp: (!) 21  (!) 23 19  Temp:  99.8 F (37.7 C)    TempSrc:  Rectal    SpO2: 100%  100% 100%  Weight:      Height:        Body mass index is 27.29 kg/m.  Physical Exam   Constitutional: Ill-appearing, lethargic elderly patient in no acute distress Eyes: No scleral injection.  HENT: No OP obstruction.  Head: Normocephalic.  Cardiovascular: Normal rate and regular rhythm.  Respiratory: Effort normal, non-labored breathing on supplemental O2 with occasional snoring respirations Skin: WDI.   Neurologic Examination   Patient rests with eyes closed and will slightly open eyes to touch and loud voice.  She initially did not follow commands but did open eyes and grimace to sternal rub.  After that, she was able to intermittently follow simple commands to raise arms and wiggle toes.  She is nonverbal.  Pupils equal round and reactive to light, does not focus or track but doll's eyes reflex intact.  Sensation appears to be intact to noxious stimuli.  Labs   CBC:  Recent Labs  Lab 09/17/23 1139 09/17/23 1144 09/17/23 1146  WBC 15.7*  --   --    NEUTROABS 13.5*  --   --   HGB 11.3* 12.2 12.6  HCT 36.7 36.0 37.0  MCV 85.0  --   --   PLT 192  --   --     Basic  Metabolic Panel:  Lab Results  Component Value Date   NA 141 09/17/2023   K 3.1 (L) 09/17/2023   CO2 35 (H) 09/17/2023   GLUCOSE 147 (H) 09/17/2023   BUN 11 09/17/2023   CREATININE 0.90 09/17/2023   CALCIUM 8.7 (L) 09/17/2023   GFRNONAA >60 09/17/2023   GFRAA >60 02/21/2019   Lipid Panel:  Lab Results  Component Value Date   LDLCALC 76 08/30/2018   HgbA1c:  Lab Results  Component Value Date   HGBA1C 5.9 02/04/2015   Urine Drug Screen:     Component Value Date/Time   LABOPIA NONE DETECTED 09/17/2023 1306   COCAINSCRNUR NONE DETECTED 09/17/2023 1306   LABBENZ NONE DETECTED 09/17/2023 1306   AMPHETMU NONE DETECTED 09/17/2023 1306   THCU NONE DETECTED 09/17/2023 1306   LABBARB NONE DETECTED 09/17/2023 1306    Alcohol Level     Component Value Date/Time   ETH <10 09/17/2023 1140   INR No results found for: "INR" APTT No results found for: "APTT" AED levels: No results found for: "PHENYTOIN", "ZONISAMIDE", "LAMOTRIGINE", "LEVETIRACETA"   CT Head without contrast(Personally reviewed): Vasogenic edema at sites of known metastatic disease in the anterior left temporal lobe and left occipital lobe  MRI Brain(Personally reviewed): Pending  rEEG:  Pending  Impression   [name] is a 62 y.o. 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  Per family, patient normally is ambulatory, alert and oriented x 4 and able to care for herself.  She has been feeling unwell and has been easily fatigued over the past few days.  This morning, she was found to be nonverbal with altered mental status,  right-sided facial droop and bilateral hand tremors.  Family called EMS and had her brought to the ED, where head CT revealed vasogenic edema at sites of known metastatic disease in the brain.  She was given Ativan and loaded with Keppra for possible seizure activity.  Will obtain brain MRI with and without contrast to evaluate for infarct or possible new metastatic disease and connect patient long-term EEG after this is finished.  Recommendations  -MRI brain with and without contrast -Based on MRI results, will consider increasing dexamethasone -Keppra 500 mg IV twice daily -Long-term EEG ______________________________________________________________________  Patient seen by NP and then by MD, MD to edit note as needed. Cortney E Ernestina Columbia , MSN, AGACNP-BC [facility] See Amion for schedule and pager information 09/17/2023 3:53 PM    Attending Neurohospitalist Addendum Patient seen and examined with APP/Resident. Agree with the history and physical as documented above. Agree with the plan as documented, which I helped formulate. I have edited the note above to reflect my full findings and recommendations. I have independently reviewed the chart, obtained history, review of systems and examined the patient.I have personally reviewed pertinent head/neck/spine imaging (CT/MRI). Please feel free to call with any questions.  -- Bing Neighbors, MD [facility] [phone]  If 7pm- 7am, please page neurology on call as listed in AMION.

## 2023-09-17 NOTE — Progress Notes (Signed)
[facility] Acute Care Liaison Note:  This patient is currently enrolled in [facility] outpatient-based palliative care.   Please call for any outpatient based palliative care related questions or concerns.  Thank you,  Glenna Fellows, BSN, RN, OCN [facility] Liaison [phone]

## 2023-09-17 NOTE — ED Notes (Signed)
ED TO INPATIENT HANDOFF REPORT  ED Nurse Name and Phone #: Murlean Iba Paramedic [phone]  S Name/Age/Gender [name] 62 y.o. female Room/Bed: 030C/030C  Code Status   Code Status: Limited: Do not attempt resuscitation (DNR) -DNR-LIMITED -Do Not Intubate/DNI   Home/SNF/Other Home Patient not oriented  Is this baseline? No   Triage Complete: Triage complete  Chief Complaint Acute metabolic encephalopathy [G93.41]  Triage Note Pt from home with [facility] for AMS. Pt was LSN at 8pm last night when she went to bed. Daughter went to check on pt around 1030am this morning and noticed she was altered, nonverbal, drooling and has a right sided facial droop. Pt was c.o headache yesterday.   Pt arrives to ED alert, sitting up, drooling, nonverbal.    Allergies Allergies  Allergen Reactions   Codeine Other (See Comments)    Avoids-felt bad when taken before Pt claims she can have this but does not like to take    Level of Care/Admitting Diagnosis ED Disposition     ED Disposition  Admit   Condition  --   Comment  Hospital Area: [facility] [[id number]]  Level of Care: Telemetry Medical [[id number]]  May admit patient to [facility] or [facility] if equivalent level of care is available:: Yes  Covid Evaluation: Asymptomatic - no recent exposure (last 10 days) testing not required  Diagnosis: Acute metabolic encephalopathy [[id number]]  Admitting Physician: Alessandra Bevels [[id number]]  Attending Physician: Alessandra Bevels [[id number]]  Certification:: I certify this patient will need inpatient services for at least 2 midnights  Expected Medical Readiness: 09/20/2023          B Medical/Surgery History Past Medical History:  Diagnosis Date   Acute on chronic respiratory failure (HCC) 10/09/2016   Allergic rhinitis    Breast cancer (HCC) 2016   right breast   COPD, mild (HCC) FOLLOWED BY DR [name]   Depression    GERD (gastroesophageal reflux disease)    HTN  (hypertension)    Hypertension    Iron deficiency anemia    Long-term current use of steroids SYMBICORT INHALER   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    Overweight (BMI 25.0-29.9) 05/11/2016   Palpitations    Personal history of radiation therapy 2016   Sarcoidosis STABLE PER CXR JUNE 2013   Shortness of breath    Sickle cell trait (HCC)    Past Surgical History:  Procedure Laterality Date   ADJUSTABLE SUTURE MANIPULATION  05/22/2012   Procedure: ADJUSTABLE SUTURE MANIPULATION;  Surgeon: Corinda Gubler, MD;  Location: [facility];  Service: Ophthalmology;  Laterality: Right;   BREAST LUMPECTOMY Right 2016   BRONCHIAL BIOPSY  07/09/2023   Procedure: BRONCHIAL BIOPSIES;  Surgeon: Leslye Peer, MD;  Location[facility] ENDOSCOPY;  Service: Pulmonary;;   BRONCHIAL BRUSHINGS  07/09/2023   Procedure: BRONCHIAL BRUSHINGS;  Surgeon: Leslye Peer, MD;  Location: [facility] ENDOSCOPY;  Service: Pulmonary;;   BRONCHIAL NEEDLE ASPIRATION BIOPSY  07/09/2023   Procedure: BRONCHIAL NEEDLE ASPIRATION BIOPSIES;  Surgeon: Leslye Peer, MD;  Location: [facility] ENDOSCOPY;  Service: Pulmonary;;   CESAREAN SECTION  1986   W/ BILATERAL TUBAL LIGATION   COLONOSCOPY WITH PROPOFOL N/A 02/21/2019   Procedure: COLONOSCOPY WITH PROPOFOL;  Surgeon: Hilarie Fredrickson, MD;  Location: [facility] ENDOSCOPY;  Service: Endoscopy;  Laterality: N/A;   EYE SURGERY     IR IMAGING GUIDED PORT INSERTION  08/01/2023   MEDIAN RECTUS REPAIR  05/22/2012   Procedure: MEDIAN RECTUS REPAIR;  Surgeon:  Corinda Gubler, MD;  Location: [facility];  Service: Ophthalmology;  Laterality: Bilateral;  INFERIOR RECTUS RESECTION WITH ADJUSTIBLE SUTURES RIGHT EYE    POLYPECTOMY  02/21/2019   Procedure: POLYPECTOMY;  Surgeon: Hilarie Fredrickson, MD;  Location: [facility] ENDOSCOPY;  Service: Endoscopy;;   RADIOACTIVE SEED GUIDED PARTIAL MASTECTOMY WITH AXILLARY SENTINEL LYMPH NODE BIOPSY Right 08/18/2015   Procedure: RADIOACTIVE SEED GUIDED PARTIAL MASTECTOMY WITH  AXILLARY SENTINEL LYMPH NODE BIOPSY;  Surgeon: Almond Lint, MD;  Location: [facility];  Service: General;  Laterality: Right;   TOTAL ROBOTIC ASSISTED LAPAROSCOPIC HYSTERECTOMY  12-30-2010   SYMPTOMATIC UTERINE FIBROIDS   UPPER TEETH EXTRACTION'S  1992   VIDEO BRONCHOSCOPY WITH RADIAL ENDOBRONCHIAL ULTRASOUND  07/09/2023   Procedure: VIDEO BRONCHOSCOPY WITH RADIAL ENDOBRONCHIAL ULTRASOUND;  Surgeon: Leslye Peer, MD;  Location: [facility] ENDOSCOPY;  Service: Pulmonary;;     A IV Location/Drains/Wounds Patient Lines/Drains/Airways Status     Active Line/Drains/Airways     Name Placement date Placement time Site Days   Implanted Port 08/01/23 Right Chest 08/01/23  1141  Chest  47   Peripheral IV 09/17/23 20 G Anterior;Left;Proximal Forearm 09/17/23  1140  Forearm  less than 1   Peripheral IV 09/17/23 18 G Anterior;Proximal;Right Forearm 09/17/23  1209  Forearm  less than 1   Peripheral IV 09/17/23 20 G Anterior;Distal;Right Forearm 09/17/23  1209  Forearm  less than 1   Wound / Incision (Open or Dehisced) 08/01/23 Puncture Neck Right 08/01/23  1138  Neck  47            Intake/Output Last 24 hours  Intake/Output Summary (Last 24 hours) at 09/17/2023 1812 Last data filed at 09/17/2023 1624 Gross per 24 hour  Intake 100 ml  Output --  Net 100 ml    Labs/Imaging Results for orders placed or performed during the hospital encounter of 09/17/23 (from the past 48 hour(s))  Comprehensive metabolic panel     Status: Abnormal   Collection Time: 09/17/23 11:39 AM  Result Value Ref Range   Sodium 146 (H) 135 - 145 mmol/L   Potassium 3.2 (L) 3.5 - 5.1 mmol/L   Chloride 93 (L) 98 - 111 mmol/L   CO2 35 (H) 22 - 32 mmol/L   Glucose, Bld 145 (H) 70 - 99 mg/dL    Comment: Glucose reference range applies only to samples taken after fasting for at least 8 hours.   BUN 10 8 - 23 mg/dL   Creatinine, Ser 1.61 (H) 0.44 - 1.00 mg/dL   Calcium 8.7 (L) 8.9 - 10.3 mg/dL   Total Protein  6.3 (L) 6.5 - 8.1 g/dL   Albumin 3.2 (L) 3.5 - 5.0 g/dL   AST 30 15 - 41 U/L   ALT 14 0 - 44 U/L   Alkaline Phosphatase 56 38 - 126 U/L   Total Bilirubin 1.4 (H) <1.2 mg/dL   GFR, Estimated >09 >60 mL/min    Comment: (NOTE) Calculated using the CKD-EPI Creatinine Equation (2021)    Anion gap 18 (H) 5 - 15    Comment: Performed at [facility], [address], [place], [place] [address]  CBC with Differential/Platelet     Status: Abnormal   Collection Time: 09/17/23 11:39 AM  Result Value Ref Range   WBC 15.7 (H) 4.0 - 10.5 K/uL   RBC 4.32 3.87 - 5.11 MIL/uL   Hemoglobin 11.3 (L) 12.0 - 15.0 g/dL   HCT 81.1 91.4 - 78.2 %   MCV  85.0 80.0 - 100.0 fL   MCH 26.2 26.0 - 34.0 pg   MCHC 30.8 30.0 - 36.0 g/dL   RDW 02.5 (H) 85.2 - 77.8 %   Platelets 192 150 - 400 K/uL   nRBC 0.0 0.0 - 0.2 %   Neutrophils Relative % 86 %   Neutro Abs 13.5 (H) 1.7 - 7.7 K/uL   Lymphocytes Relative 5 %   Lymphs Abs 0.8 0.7 - 4.0 K/uL   Monocytes Relative 9 %   Monocytes Absolute 1.3 (H) 0.1 - 1.0 K/uL   Eosinophils Relative 0 %   Eosinophils Absolute 0.1 0.0 - 0.5 K/uL   Basophils Relative 0 %   Basophils Absolute 0.0 0.0 - 0.1 K/uL   Immature Granulocytes 0 %   Abs Immature Granulocytes 0.07 0.00 - 0.07 K/uL    Comment: Performed at [facility], [address], [place], [place] [address]  Ethanol     Status: None   Collection Time: 09/17/23 11:40 AM  Result Value Ref Range   Alcohol, Ethyl (B) <10 <10 mg/dL    Comment: (NOTE) Lowest detectable limit for serum alcohol is 10 mg/dL.  For medical purposes only. Performed at [facility], [address], [place], [place] [address]   CBG monitoring, ED     Status: Abnormal   Collection Time: 09/17/23 11:41 AM  Result Value Ref Range   Glucose-Capillary 142 (H) 70 - 99 mg/dL    Comment: Glucose reference range applies only to samples taken after fasting for at least 8 hours.  I-Stat Chem 8, ED     Status: Abnormal   Collection  Time: 09/17/23 11:44 AM  Result Value Ref Range   Sodium 141 135 - 145 mmol/L   Potassium 3.0 (L) 3.5 - 5.1 mmol/L   Chloride 90 (L) 98 - 111 mmol/L   BUN 11 8 - 23 mg/dL   Creatinine, Ser 3.15 0.44 - 1.00 mg/dL   Glucose, Bld 400 (H) 70 - 99 mg/dL    Comment: Glucose reference range applies only to samples taken after fasting for at least 8 hours.   Calcium, Ion 1.01 (L) 1.15 - 1.40 mmol/L   TCO2 39 (H) 22 - 32 mmol/L   Hemoglobin 12.2 12.0 - 15.0 g/dL   HCT 86.7 61.9 - 50.9 %  I-Stat Lactic Acid, ED     Status: Abnormal   Collection Time: 09/17/23 11:46 AM  Result Value Ref Range   Lactic Acid, Venous 3.0 (HH) 0.5 - 1.9 mmol/L   Comment NOTIFIED PHYSICIAN   I-Stat venous blood gas, ED     Status: Abnormal   Collection Time: 09/17/23 11:46 AM  Result Value Ref Range   pH, Ven 7.435 (H) 7.25 - 7.43   pCO2, Ven 63.0 (H) 44 - 60 mmHg   pO2, Ven 70 (H) 32 - 45 mmHg   Bicarbonate 42.3 (H) 20.0 - 28.0 mmol/L   TCO2 44 (H) 22 - 32 mmol/L   O2 Saturation 94 %   Acid-Base Excess 15.0 (H) 0.0 - 2.0 mmol/L   Sodium 141 135 - 145 mmol/L   Potassium 3.1 (L) 3.5 - 5.1 mmol/L   Calcium, Ion 1.02 (L) 1.15 - 1.40 mmol/L   HCT 37.0 36.0 - 46.0 %   Hemoglobin 12.6 12.0 - 15.0 g/dL   Sample type VENOUS   Ammonia     Status: Abnormal   Collection Time: 09/17/23 12:00 PM  Result Value Ref Range   Ammonia 40 (H)  9 - 35 umol/L    Comment: Performed at [facility], [address], [place], [place] [address]  Rapid urine drug screen (hospital performed)     Status: None   Collection Time: 09/17/23  1:06 PM  Result Value Ref Range   Opiates NONE DETECTED NONE DETECTED   Cocaine NONE DETECTED NONE DETECTED   Benzodiazepines NONE DETECTED NONE DETECTED   Amphetamines NONE DETECTED NONE DETECTED   Tetrahydrocannabinol NONE DETECTED NONE DETECTED   Barbiturates NONE DETECTED NONE DETECTED    Comment: (NOTE) DRUG SCREEN FOR MEDICAL PURPOSES ONLY.  IF CONFIRMATION IS NEEDED FOR ANY PURPOSE,  NOTIFY LAB WITHIN 5 DAYS.  LOWEST DETECTABLE LIMITS FOR URINE DRUG SCREEN Drug Class                     Cutoff (ng/mL) Amphetamine and metabolites    1000 Barbiturate and metabolites    200 Benzodiazepine                 200 Opiates and metabolites        300 Cocaine and metabolites        300 THC                            50 Performed at [facility], [address], [place], [place] [address]   Urinalysis, Routine w reflex microscopic -Urine, Clean Catch     Status: Abnormal   Collection Time: 09/17/23  1:06 PM  Result Value Ref Range   Color, Urine YELLOW YELLOW   APPearance HAZY (A) CLEAR   Specific Gravity, Urine 1.015 1.005 - 1.030   pH 5.0 5.0 - 8.0   Glucose, UA NEGATIVE NEGATIVE mg/dL   Hgb urine dipstick MODERATE (A) NEGATIVE   Bilirubin Urine NEGATIVE NEGATIVE   Ketones, ur NEGATIVE NEGATIVE mg/dL   Protein, ur 098 (A) NEGATIVE mg/dL   Nitrite NEGATIVE NEGATIVE   Leukocytes,Ua NEGATIVE NEGATIVE   RBC / HPF 11-20 0 - 5 RBC/hpf   WBC, UA 0-5 0 - 5 WBC/hpf   Bacteria, UA RARE (A) NONE SEEN   Squamous Epithelial / HPF 0-5 0 - 5 /HPF   Mucus PRESENT    Hyaline Casts, UA PRESENT     Comment: Performed at [facility], [address], [place], [place] [address]   *Note: Due to a large number of results and/or encounters for the requested time period, some results have not been displayed. A complete set of results can be found in Results Review.   DG Chest Port 1 View  Result Date: 09/17/2023 CLINICAL DATA:  Altered mental status.  Lung cancer. EXAM: PORTABLE CHEST 1 VIEW COMPARISON:  08/27/2023. FINDINGS: There is a right chest wall port a catheter with tip in the distal SVC. Stable cardiomediastinal contours. The right upper lobe treated lung mass is again noted with signs of central cavitary change. There is asymmetric right lung volume loss with increase opacification in the right lower lung compared with the previous exam. Left lung appears clear.  IMPRESSION: 1. Increased opacification in the right lower lung compared with the previous exam. Atelectasis and or pleural effusion. Underlying pneumonia would be difficult to exclude. 2. Stable appearance of right upper lobe lung mass with central cavitation. Electronically Signed   By: Signa Kell M.D.   On: 09/17/2023 15:41   CT HEAD WO CONTRAST  Result Date: 09/17/2023 CLINICAL DATA:  Mental  status change, unknown cause EXAM: CT HEAD WITHOUT CONTRAST TECHNIQUE: Contiguous axial images were obtained from the base of the skull through the vertex without intravenous contrast. RADIATION DOSE REDUCTION: This exam was performed according to the departmental dose-optimization program which includes automated exposure control, adjustment of the mA and/or kV according to patient size and/or use of iterative reconstruction technique. COMPARISON:  Brain MR 08/29/23 FINDINGS: Brain: No hemorrhage. No hydrocephalus. No extra-axial fluid collection. No CT evidence of an acute cortical infarct. 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 There is no definite evidence of new metastatic disease on this noncontrast enhanced exam. Vascular: No hyperdense vessel or unexpected calcification. Skull: Normal. Negative for fracture or focal lesion. Sinuses/Orbits: No acute finding. Other: None. IMPRESSION: 1. No CT evidence of an acute cortical infarct or intracranial hemorrhage. 2. Redemonstrated vasogenic edema at sites of known metastatic disease in the anterior left temporal lobe and in the left occipital lobe. There is no definite evidence of new metastatic disease on this noncontrast enhanced exam. If more detailed characterization for new metastatic disease is clinically warranted, further evaluation with repeat brain MRI recommended. Electronically Signed   By: Lorenza Cambridge M.D.   On: 09/17/2023 14:59    Pending  Labs Unresulted Labs (From admission, onward)     Start     Ordered   09/24/23 0500  Creatinine, serum  (enoxaparin (LOVENOX)    CrCl >/= 30 ml/min)  Weekly,   R     Comments: while on enoxaparin therapy    09/17/23 1756   09/17/23 1812  Lactic acid, plasma  (Lactic Acid)  STAT Now then every 3 hours,   R (with STAT occurrences)      09/17/23 1811   09/17/23 1754  CBC  (enoxaparin (LOVENOX)    CrCl >/= 30 ml/min)  Once,   R       Comments: Baseline for enoxaparin therapy IF NOT ALREADY DRAWN.  Notify MD if PLT < 100 K.    09/17/23 1756   09/17/23 1754  Creatinine, serum  (enoxaparin (LOVENOX)    CrCl >/= 30 ml/min)  Once,   R       Comments: Baseline for enoxaparin therapy IF NOT ALREADY DRAWN.    09/17/23 1756   09/17/23 1140  Blood Culture (Routine X 2)  BLOOD CULTURE X 2,   R (with STAT occurrences)      09/17/23 1140   09/17/23 0000  CBC with Differential (Cancer Center Only)  STAT        09/17/23 1648   09/17/23 0000  CMP (Cancer Center only)  STAT        09/17/23 1648   09/17/23 0000  T4  R        09/17/23 1648   09/17/23 0000  TSH  R        09/17/23 1648            Vitals/Pain Today's Vitals   09/17/23 1645 09/17/23 1658 09/17/23 1715 09/17/23 1730  BP: 115/83  103/83 108/79  Pulse: 96  98 92  Resp: 19  17 18   Temp:  99 F (37.2 C)    TempSrc:  Axillary    SpO2: 100%  100% 100%  Weight:      Height:        Isolation Precautions No active isolations  Medications Medications  levETIRAcetam (KEPPRA) IVPB 500 mg/100 mL premix (has  no administration in time range)  pantoprazole (PROTONIX) injection 40 mg (has no administration in time range)  ipratropium-albuterol (DUONEB) 0.5-2.5 (3) MG/3ML nebulizer solution 3 mL (has no administration in time range)  metoprolol tartrate (LOPRESSOR) injection 5 mg (has no administration in time range)  enoxaparin (LOVENOX) injection 40 mg (has no administration in time range)  lactated ringers infusion (has no  administration in time range)  aspirin suppository 300 mg (has no administration in time range)  dexamethasone (DECADRON) injection 4 mg (has no administration in time range)  cefTRIAXone (ROCEPHIN) 1 g in sodium chloride 0.9 % 100 mL IVPB (has no administration in time range)  LORazepam (ATIVAN) injection 2 mg (2 mg Intravenous Given 09/17/23 1139)  dexamethasone (DECADRON) injection 10 mg (10 mg Intravenous Given 09/17/23 1216)  ipratropium-albuterol (DUONEB) 0.5-2.5 (3) MG/3ML nebulizer solution 3 mL (3 mLs Nebulization Given 09/17/23 1331)  levETIRAcetam (KEPPRA) IVPB 1500 mg/ 100 mL premix (0 mg Intravenous Stopped 09/17/23 1624)    Mobility non-ambulatory     Focused Assessments Neuro Assessment Handoff:  Swallow screen pass? No          Neuro Assessment: Exceptions to [facility] Neuro Checks:      Has TPA been given? No If patient is a Neuro Trauma and patient is going to OR before floor call report to 4N Charge nurse: [phone] or [phone]   R Recommendations: See Admitting Provider Note  Report given to:   Additional Notes:

## 2023-09-17 NOTE — ED Notes (Signed)
Patient transported to CT with paramedic

## 2023-09-17 NOTE — H&P (Addendum)
History and Physical    DOA: 09/17/2023  PCP: [name]., FNP  Patient coming from: Home  Chief Complaint: Altered mental status  HPI: [name] is a 62 y.o. 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  Currently patient's niece, Shanda Bumps, at bedside and able to provide history of presenting illness.  Per niece, patient's daughter and several other family members including herself live close by and or involved in care of the patient.  Patient apparently has had several radiation treatments so far and her last RT was about a week back.  She apparently was supposed to have palliative chemo initiated with first dose expected today.  As such, patient's daughter and sister went to check on her this morning and found patient to be sitting on edge of the bed slumped over, unresponsive with frothing at the right side of her mouth.  According to the niece there was also concern for some left-sided weakness but no tonic-clonic movements or incontinence or tongue bite noted.  At baseline patient apparently communicative, oriented and able to perform basic ADLs.  She uses rollator walker on the days when she is feeling weak does not have any focal deficits. She did have new brain mets diagnosed by MRI on 08/29/2023 and was prescribed oral Decadron as outpatient which she has been taking. ED course: Afebrile, Tmax 99.8, pulse 96-1 22, respiratory rate 19, BP 91/72-1 36/74, O2 sat 96 to 100% on baseline 2 L O2.  Per bedside RN, on arrival patient noted to have rhythmic movements of her lower jaw with altered mental status and there was concern for seizures.   Patient received 2 mg IV Ativan after which she has been somnolent.  CT head obtained showed similar findings of prior MRI with multiple brain mets and surrounding edema.  Patient received 10 mg IV Decadron in the ED and Case was discussed with neurology as well as radiation oncologist, Dr. [name] who recommended admission with IV Keppra, IV Decadron and for stroke workup.   Review of Systems: As per HPI, otherwise review of systems negative.    Past Medical History:  Diagnosis Date   Acute on chronic respiratory failure (HCC) 10/09/2016   Allergic rhinitis    Breast cancer (HCC) 2016   right breast   COPD, mild (HCC) FOLLOWED BY DR GNFA   Depression    GERD (gastroesophageal reflux disease)    HTN (hypertension)    Hypertension    Iron deficiency anemia    Long-term current use of steroids SYMBICORT INHALER   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    Overweight (BMI 25.0-29.9) 05/11/2016   Palpitations    Personal history of radiation therapy 2016   Sarcoidosis STABLE PER CXR JUNE 2013   Shortness of breath    Sickle cell trait (HCC)     Past Surgical History:  Procedure Laterality Date   ADJUSTABLE SUTURE MANIPULATION  05/22/2012   Procedure: ADJUSTABLE SUTURE MANIPULATION;  Surgeon: Corinda Gubler, MD;  Location: [facility];  Service: Ophthalmology;  Laterality: Right;   BREAST LUMPECTOMY Right 2016   BRONCHIAL BIOPSY  07/09/2023   Procedure: BRONCHIAL BIOPSIES;  Surgeon: Leslye Peer, MD;  Location: [facility];  Service: Pulmonary;;   BRONCHIAL BRUSHINGS  07/09/2023   Procedure: BRONCHIAL BRUSHINGS;  Surgeon: Leslye Peer, MD;  Location: [facility];  Service: Pulmonary;;   BRONCHIAL NEEDLE ASPIRATION BIOPSY  07/09/2023   Procedure: BRONCHIAL NEEDLE ASPIRATION BIOPSIES;  Surgeon: Leslye Peer, MD;  Location: [facility]OSCOPY;  Service: Pulmonary;;   CESAREAN SECTION  1986   W/ BILATERAL TUBAL LIGATION   COLONOSCOPY WITH PROPOFOL N/A 02/21/2019   Procedure: COLONOSCOPY  WITH PROPOFOL;  Surgeon: Hilarie Fredrickson, MD;  Location: [facility];  Service: Endoscopy;  Laterality: N/A;   EYE SURGERY     IR IMAGING GUIDED PORT INSERTION  08/01/2023   MEDIAN RECTUS REPAIR  05/22/2012   Procedure: MEDIAN RECTUS REPAIR;  Surgeon: Corinda Gubler, MD;  Location: [facility];  Service: Ophthalmology;  Laterality: Bilateral;  INFERIOR RECTUS RESECTION WITH ADJUSTIBLE SUTURES RIGHT EYE    POLYPECTOMY  02/21/2019   Procedure: POLYPECTOMY;  Surgeon: Hilarie Fredrickson, MD;  Location: [facility];  Service: Endoscopy;;   RADIOACTIVE SEED GUIDED PARTIAL MASTECTOMY WITH AXILLARY SENTINEL LYMPH NODE BIOPSY Right 08/18/2015   Procedure: RADIOACTIVE SEED GUIDED PARTIAL MASTECTOMY WITH AXILLARY SENTINEL LYMPH NODE BIOPSY;  Surgeon: Almond Lint, MD;  Location: [facility];  Service: General;  Laterality: Right;   TOTAL ROBOTIC ASSISTED LAPAROSCOPIC HYSTERECTOMY  12-30-2010   SYMPTOMATIC UTERINE FIBROIDS   UPPER TEETH EXTRACTION'S  1992   VIDEO BRONCHOSCOPY WITH RADIAL ENDOBRONCHIAL ULTRASOUND  07/09/2023   Procedure: VIDEO BRONCHOSCOPY WITH RADIAL ENDOBRONCHIAL ULTRASOUND;  Surgeon: Leslye Peer, MD;  Location: [facility];  Service: Pulmonary;;    Social history:  reports that she quit smoking about 19 years ago. Her smoking use included cigarettes. She started smoking about 39 years ago. She has a 20 pack-year smoking history. She has never used smokeless tobacco. She reports that she does not drink alcohol and does not use drugs.   Allergies  Allergen Reactions   Codeine Other (See Comments)    Avoids-felt bad when taken before Pt claims she can have this but does not like to take    Family History  Problem Relation Age of Onset   Hyperlipidemia Mother    Asthma Mother    Lupus Mother    Breast cancer Mother 60   Hypertension Father    Hyperlipidemia Father    Hypertension Sister    Hypertension Brother    Cancer Neg Hx    Diabetes Neg Hx     Coronary artery disease Neg Hx       Prior to Admission medications   Medication Sig Start Date End Date Taking? Authorizing Provider  acetaminophen (TYLENOL) 325 MG tablet Take 650 mg by mouth every 6 (six) hours as needed for mild pain or moderate pain.   Yes [provider]  albuterol (PROVENTIL) (2.5 MG/3ML) 0.083% nebulizer solution Take 3 mLs (2.5 mg total) by nebulization every 6 (six) hours as needed for wheezing or shortness of breath. 12/22/18  Yes Osvaldo Shipper, MD  albuterol (VENTOLIN HFA) 108 (90 Base) MCG/ACT inhaler INHALE 2 PUFFS EVERY 6 HOURS AS NEEDED FOR WHEEZING OR SHORTNESS OF BREATH Patient taking differently: Inhale 2 puffs into the lungs every 6 (six) hours as needed for shortness of breath or wheezing. 09/23/19  Yes Nyoka Cowden, MD  aspirin EC 81 MG tablet Take 1 tablet (81 mg total) by mouth daily. Start 02/23/19 02/21/19  Yes Pokhrel, Laxman, MD  atorvastatin (LIPITOR) 20 MG tablet TAKE 1 TABLET (20 MG TOTAL)  BY MOUTH DAILY. 03/28/21  Yes Sandre Kitty, MD  dexamethasone (DECADRON) 4 MG tablet Take 1 tablet (4 mg total) by mouth 2 (two) times daily. 09/01/23  Yes Danford, Earl Lites, MD  fluticasone (FLONASE) 50 MCG/ACT nasal spray USE 2 SPRAYS IN BOTH  NOSTRILS DAILY AS NEEDED  FOR ALLERGIES Patient taking differently: Place 2 sprays into both nostrils daily as needed for allergies. 08/15/21  Yes Nyoka Cowden, MD  hydroxychloroquine (PLAQUENIL) 200 MG tablet TAKE 1 TABLET BY MOUTH DAILY 03/19/23  Yes Nyoka Cowden, MD  lidocaine-prilocaine (EMLA) cream Apply to affected area once Patient taking differently: Apply 1 Application topically See admin instructions. Apply to affected area once as directed 08/01/23  Yes Si Gaul, MD  metoprolol tartrate (LOPRESSOR) 25 MG tablet Take 1 tablet (25 mg total) by mouth daily. 07/09/23  Yes Leslye Peer, MD  montelukast (SINGULAIR) 10 MG tablet Take 10 mg by mouth at bedtime. 07/31/22  Yes [provider]  Multiple Vitamin (MULTIVITAMIN) capsule Take 1 capsule by mouth daily with breakfast.   Yes [provider]  omeprazole (PRILOSEC) 20 MG capsule TAKE 1 CAPSULE EVERY DAY Patient taking differently: Take 20 mg by mouth daily before breakfast. 01/03/21  Yes Sandre Kitty, MD  ondansetron (ZOFRAN) 4 MG tablet Take 1 tablet (4 mg total) by mouth every 6 (six) hours as needed for nausea. 06/28/23  Yes Alwyn Ren, MD  prochlorperazine (COMPAZINE) 10 MG tablet Take 1 tablet (10 mg total) by mouth every 6 (six) hours as needed for nausea or vomiting. 08/01/23  Yes Si Gaul, MD  sertraline (ZOLOFT) 50 MG tablet TAKE 1 TABLET EVERY DAY Patient taking differently: Take 50 mg by mouth daily. 09/11/20  Yes Sandre Kitty, MD  STIOLTO RESPIMAT 2.5-2.5 MCG/ACT AERS USE 2 INHALATIONS BY MOUTH DAILY 09/17/23  Yes Nyoka Cowden, MD  dexamethasone (DECADRON) 4 MG tablet Take 1 tablet (4 mg total) by mouth 2 (two) times daily for 7 days, THEN 0.5 tablets (2 mg total) 2 (two) times daily for 7 days, THEN 0.5 tablets (2 mg total) daily for 7 days, THEN 0.5 tablets (2 mg total) every other day for 7 days. Patient not taking: Reported on 09/17/2023 09/10/23 10/08/23  Erven Colla, PA-C  oxyCODONE (ROXICODONE) 5 MG immediate release tablet Take 1 tablet (5 mg total) by mouth every 8 (eight) hours as needed. Patient not taking: Reported on 09/17/2023 09/01/23 08/31/24  Alberteen Sam, MD  OXYGEN Inhale 2-3 L/min into the lungs See admin instructions. Inhale 2-3 L/min into the lungs CONTINUOUSLY; 2 L/min at rest and 3 L/min if exerted    [provider]    Physical Exam: Vitals:   09/17/23 1645 09/17/23 1658 09/17/23 1715 09/17/23 1730  BP: 115/83  103/83 108/79  Pulse: 96  98 92  Resp: 19  17 18   Temp:  99 F (37.2 C)    TempSrc:  Axillary    SpO2: 100%  100% 100%  Weight:      Height:        Constitutional: Patient sedated, no apparent discomfort,  hemodynamically stable.  2 L O2 nasal cannula Eyes: PERRL, lids and conjunctivae normal ENMT: Mucous membranes are moist.  Could not examine further Neck: normal, supple, no masses, no thyromegaly Respiratory: clear to auscultation bilaterally, no wheezing, no crackles. Normal respiratory effort. No accessory muscle use.  Cardiovascular: Regular rate and rhythm currently, no murmurs / rubs / gallops. No extremity edema.  2+ pedal pulses..  Abdomen: no tenderness, no masses palpated. No hepatosplenomegaly. Bowel sounds positive.  Musculoskeletal: no clubbing / cyanosis. No joint deformity upper and lower extremities. Good ROM, no contractures. Normal muscle tone.  Neurologic: Currently sedated after IV Ativan and IV Keppra, not responding to verbal or painful stimuli, according to niece patient was moving all extremities upon neurology team exam earlier.  Noted patient has her legs crossed over and Babinski's downgoing.  Could not test further. Psychiatric: Normal judgment and insight. Alert and oriented x 3. Normal mood.  SKIN/catheters: no rashes, lesions, ulcers.  Port-A-Cath in place  Labs on Admission: I have personally reviewed following labs and imaging studies  CBC: Recent Labs  Lab 09/17/23 1139 09/17/23 1144 09/17/23 1146  WBC 15.7*  --   --   NEUTROABS 13.5*  --   --   HGB 11.3* 12.2 12.6  HCT 36.7 36.0 37.0  MCV 85.0  --   --   PLT 192  --   --    Basic Metabolic Panel: Recent Labs  Lab 09/17/23 1139 09/17/23 1144 09/17/23 1146  NA 146* 141 141  K 3.2* 3.0* 3.1*  CL 93* 90*  --   CO2 35*  --   --   GLUCOSE 145* 147*  --   BUN 10 11  --   CREATININE 1.04* 0.90  --   CALCIUM 8.7*  --   --    GFR: Estimated Creatinine Clearance: 64.6 mL/min (by C-G formula based on SCr of 0.9 mg/dL). Recent Labs  Lab 09/17/23 1139 09/17/23 1146  WBC 15.7*  --   LATICACIDVEN  --  3.0*   Liver Function Tests: Recent Labs  Lab 09/17/23 1139  AST 30  ALT 14  ALKPHOS 56   BILITOT 1.4*  PROT 6.3*  ALBUMIN 3.2*   No results for input(s): "LIPASE", "AMYLASE" in the last 168 hours. Recent Labs  Lab 09/17/23 1200  AMMONIA 40*   Coagulation Profile: No results for input(s): "INR", "PROTIME" in the last 168 hours. Cardiac Enzymes: No results for input(s): "CKTOTAL", "CKMB", "CKMBINDEX", "TROPONINI" in the last 168 hours. BNP (last 3 results) No results for input(s): "PROBNP" in the last 8760 hours. HbA1C: No results for input(s): "HGBA1C" in the last 72 hours. CBG: Recent Labs  Lab 09/17/23 1141  GLUCAP 142*   Lipid Profile: No results for input(s): "CHOL", "HDL", "LDLCALC", "TRIG", "CHOLHDL", "LDLDIRECT" in the last 72 hours. Thyroid Function Tests: No results for input(s): "TSH", "T4TOTAL", "FREET4", "T3FREE", "THYROIDAB" in the last 72 hours. Anemia Panel: No results for input(s): "VITAMINB12", "FOLATE", "FERRITIN", "TIBC", "IRON", "RETICCTPCT" in the last 72 hours. Urine analysis:    Component Value Date/Time   COLORURINE YELLOW 09/17/2023 1306   APPEARANCEUR HAZY (A) 09/17/2023 1306   LABSPEC 1.015 09/17/2023 1306   PHURINE 5.0 09/17/2023 1306   GLUCOSEU NEGATIVE 09/17/2023 1306   HGBUR MODERATE (A) 09/17/2023 1306   HGBUR small 05/13/2010 1505   BILIRUBINUR NEGATIVE 09/17/2023 1306   BILIRUBINUR negative 12/08/2019 0000   BILIRUBINUR NEG 12/29/2015 1444   KETONESUR NEGATIVE 09/17/2023 1306   PROTEINUR 100 (A) 09/17/2023 1306   UROBILINOGEN 0.2 12/08/2019 0000   UROBILINOGEN 0.2 04/17/2015 1618   NITRITE NEGATIVE 09/17/2023 1306   LEUKOCYTESUR NEGATIVE 09/17/2023 1306    Radiological Exams on Admission: Personally reviewed  DG Chest Port 1 View  Result Date: 09/17/2023 CLINICAL DATA:  Altered mental status.  Lung cancer. EXAM: PORTABLE CHEST 1 VIEW COMPARISON:  08/27/2023. FINDINGS: There is a right  chest wall port a catheter with tip in the distal SVC. Stable cardiomediastinal contours. The right upper lobe treated lung mass is  again noted with signs of central cavitary change. There is asymmetric right lung volume loss with increase opacification in the right lower lung compared with the previous exam. Left lung appears clear. IMPRESSION: 1. Increased opacification in the right lower lung compared with the previous exam. Atelectasis and or pleural effusion. Underlying pneumonia would be difficult to exclude. 2. Stable appearance of right upper lobe lung mass with central cavitation. Electronically Signed   By: Signa Kell M.D.   On: 09/17/2023 15:41   CT HEAD WO CONTRAST  Result Date: 09/17/2023 CLINICAL DATA:  Mental status change, unknown cause EXAM: CT HEAD WITHOUT CONTRAST TECHNIQUE: Contiguous axial images were obtained from the base of the skull through the vertex without intravenous contrast. RADIATION DOSE REDUCTION: This exam was performed according to the departmental dose-optimization program which includes automated exposure control, adjustment of the mA and/or kV according to patient size and/or use of iterative reconstruction technique. COMPARISON:  Brain MR 08/29/23 FINDINGS: Brain: No hemorrhage. No hydrocephalus. No extra-axial fluid collection. No CT evidence of an acute cortical infarct. 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 There is no definite evidence of new metastatic disease on this noncontrast enhanced exam. Vascular: No hyperdense vessel or unexpected calcification. Skull: Normal. Negative for fracture or focal lesion. Sinuses/Orbits: No acute finding. Other: None. IMPRESSION: 1. No CT evidence of an acute cortical infarct or intracranial hemorrhage. 2. Redemonstrated vasogenic edema at sites of known metastatic disease in the anterior left temporal lobe and in the left occipital lobe. There is no definite evidence of new metastatic disease on this noncontrast enhanced exam. If more detailed characterization  for new metastatic disease is clinically warranted, further evaluation with repeat brain MRI recommended. Electronically Signed   By: Lorenza Cambridge M.D.   On: 09/17/2023 14:59    EKG: Independently reviewed.  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  Appreciate neurology input.  Noted that they have ordered IV Keppra and ordered MRI-pending.  Last MRI did show new parietal/occipital mets and patient initiated on tapering Decadron dosages on 10/28 by radiation oncology. Will order EEG and continue IV Keppra as ordered by neurology, will have IV Ativan as needed available for seizure activity.  Patient on aspirin daily at home-will order per rectum until more awake and alert.  N.p.o. and IV fluids ordered.  Continue neurochecks. Hold p.o. meds for now.   2.  Stage IV lung CA with brain mets: Patient received 10 mg IV Decadron in the ED, will order IV Decadron 4 mg every 6 for maintenance per discussion with Dr. [name]  3.  Chronic respiratory failure due to COPD/sarcoidosis: DuoNebs as needed, resume  home O2 2 L.  Can resume home inhaler therapy when more awake.    4.  Mild hyponatremia, hypokalemia: Replace potassium IV, monitor sodium levels with IV hydration.  5.  Hypertension: Currently blood pressure low normal, noted to be on metoprolol at home.   Will have IV metoprolol as needed available.  6.   Leukocytosis and lactic acidosis could be reactive secondary to possible seizure.  Patient is afebrile.  UA normal, chest x-ray did show known right upper lobe mass with cavitation, right lower lobe atelectasis versus pleural effusion, pneumonia could not be excluded.  Will give empiric antibiotics and cycle lactic acid although suspicion low as leukocytosis could be related to outpatient Decadron use  7.Cirrhosis: This is listed on her problem list.  Currently she does not appear to be decompensated.  According to niece, patient takes Tylenol 3 50 mg as needed for pain at home.  8.  GERD: IV PPI until more awake.  Per niece, patient complained of abdominal discomfort over the weekend and was relieved after taking PPI, acetaminophen.  8.  Depression/anxiety: Resume home meds when able to take p.o.  9.  Goals of care: Patient is a DNR.  Daughter did confirm CODE STATUS to be DNR with ED physician and currently niece who is also a caregiver confirm the same.  Discussed with niece regarding palliative care consultation in this hospitalization and niece stated family has been contemplating this given recent deterioration.  She stated family would be open to discuss care goals with palliative care in this hospitalization.  Consult placed.  DVT prophylaxis: Lovenox   Code Status: DNR.Health care proxy would be daughter, Edwina Barth  Patient/Family Communication: Discussed with patient and all questions answered to satisfaction.  Consults called: Neurology, radiation oncology, palliative care and oncology Dr. Gwenyth Bouillon made aware of patient's hospitalization. Admission status :I certify that at the point of admission it is my clinical judgment that the patient will require inpatient hospital care spanning beyond 2 midnights from the point of admission due to high intensity of service and high frequency of surveillance required.Inpatient status is judged to  be reasonable and necessary in order to provide the required intensity of service to ensure the patient's safety. The patient's presenting symptoms, physical exam findings, and initial radiographic and laboratory data in the context of their chronic comorbidities is felt to place them at high risk for further clinical deterioration. The following factors support the patient status of inpatient : Acute metabolic encephalopathy and metastatic cancer with very poor prognosis , anticipate greater than 2 midnight stay for IV treatments.     Alessandra Bevels MD [facility] Pager in [place]  If 7PM-7AM, please contact night-coverage [email or web address]   09/17/2023, 5:45 PM

## 2023-09-18 DIAGNOSIS — R569 Unspecified convulsions: Secondary | ICD-10-CM | POA: Diagnosis not present

## 2023-09-18 DIAGNOSIS — R4182 Altered mental status, unspecified: Secondary | ICD-10-CM | POA: Diagnosis not present

## 2023-09-18 DIAGNOSIS — Z7189 Other specified counseling: Secondary | ICD-10-CM

## 2023-09-18 DIAGNOSIS — R410 Disorientation, unspecified: Secondary | ICD-10-CM

## 2023-09-18 DIAGNOSIS — G936 Cerebral edema: Secondary | ICD-10-CM | POA: Diagnosis not present

## 2023-09-18 DIAGNOSIS — C3491 Malignant neoplasm of unspecified part of right bronchus or lung: Secondary | ICD-10-CM | POA: Diagnosis not present

## 2023-09-18 DIAGNOSIS — Z515 Encounter for palliative care: Secondary | ICD-10-CM

## 2023-09-18 DIAGNOSIS — C3490 Malignant neoplasm of unspecified part of unspecified bronchus or lung: Secondary | ICD-10-CM | POA: Diagnosis not present

## 2023-09-18 LAB — BASIC METABOLIC PANEL
Anion gap: 14 (ref 5–15)
BUN: 17 mg/dL (ref 8–23)
CO2: 33 mmol/L — ABNORMAL HIGH (ref 22–32)
Calcium: 8 mg/dL — ABNORMAL LOW (ref 8.9–10.3)
Chloride: 96 mmol/L — ABNORMAL LOW (ref 98–111)
Creatinine, Ser: 1.16 mg/dL — ABNORMAL HIGH (ref 0.44–1.00)
GFR, Estimated: 53 mL/min — ABNORMAL LOW (ref >60)
Glucose, Bld: 128 mg/dL — ABNORMAL HIGH (ref 70–99)
Potassium: 3.6 mmol/L (ref 3.5–5.1)
Sodium: 143 mmol/L (ref 135–145)

## 2023-09-18 MED ORDER — SODIUM CHLORIDE 0.9 % IV SOLN
2.0000 g | INTRAVENOUS | Status: DC
  Administered 2023-09-18 – 2023-09-20 (×3): 2 g via INTRAVENOUS
  Filled 2023-09-18 (×3): qty 20

## 2023-09-18 MED ORDER — LEVETIRACETAM IN NACL 1000 MG/100ML IV SOLN
1000.0000 mg | Freq: Two times a day (BID) | INTRAVENOUS | Status: DC
  Filled 2023-09-18: qty 100

## 2023-09-18 MED ORDER — SODIUM CHLORIDE 0.9 % IV SOLN
750.0000 mg | Freq: Two times a day (BID) | INTRAVENOUS | Status: DC
  Administered 2023-09-18: 750 mg via INTRAVENOUS
  Filled 2023-09-18 (×3): qty 7.5

## 2023-09-18 MED ORDER — LEVETIRACETAM IN NACL 500 MG/100ML IV SOLN
500.0000 mg | Freq: Once | INTRAVENOUS | Status: AC
  Administered 2023-09-18: 500 mg via INTRAVENOUS
  Filled 2023-09-18: qty 100

## 2023-09-18 MED ORDER — LACTULOSE 10 GM/15ML PO SOLN
10.0000 g | Freq: Two times a day (BID) | ORAL | Status: DC
  Administered 2023-09-18 – 2023-09-21 (×7): 10 g via ORAL
  Filled 2023-09-18 (×7): qty 15

## 2023-09-18 NOTE — Procedures (Signed)
Patient Name: [name]  MRN: [id number]  Epilepsy Attending: Charlsie Quest  Referring Physician/Provider: Marjorie Smolder, NP  Duration: 09/18/2023 0020 to 0830   Patient history:  62 y.o. 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 EEG to evaluate for seizure   Level of alertness:  comatose/ lethargic-->awake    AEDs during EEG study: LEV   Technical aspects: This EEG study was done with scalp electrodes positioned according to the 10-20 International system of electrode placement. Electrical activity was reviewed with band pass filter of 1-70Hz , sensitivity of 7 uV/mm, display speed of 72mm/sec with a 60Hz  notched filter applied as appropriate. EEG data were recorded continuously and digitally stored.  Video monitoring was available and reviewed as appropriate.   Description: EEG initially showed lateralized periodic discharges ( LPDs) with overriding fast activity were noted in left hemisphere with fluctuating frequency of 0.5 to 2 Hz, at times with overlying rhythmicity, without definite evolution. EEG also showed continuous generalized 3 to 6 Hz theta- delta slowing admixed with 12 to 15 Hz with activity.   Gradually, the morphology of LPDs improved and there was no overriding fast activity.  Additionally the frequency of LPDs improved to 0.25-0.5Hz .  EEG also showed posterior dominant rhythm of 8 Hz activity of moderate voltage (25-35 uV) seen predominantly in posterior head regions, symmetric and reactive to eye opening and eye closing.  Intermittent generalized and lateralized left hemisphere 3-6 Esti Deppen delta slowing was also noted.  Hyperventilation and photic stimulation were not performed.       ABNORMALITY - Lateralized periodic discharges ( LPD), left hemisphere - Continuous slow, generalized and lateralized left hemisphere   IMPRESSION: This study initially showed evidence of epileptogenicity arising from left hemisphere with increased risk of seizure recurrence.  Additionally there was moderate diffuse encephalopathy.    Gradually, EEG improved and was suggestive of cortical dysfunction in left hemisphere likely secondary to underlying structural abnormality as well as mild diffuse encephalopathy.    No seizures were seen during the study.   [name] Annabelle Harman

## 2023-09-18 NOTE — Procedures (Addendum)
Patient Name: [name]  MRN: [id number]  Epilepsy Attending: Charlsie Quest  Referring Physician/Provider: Alessandra Bevels, MD  Date: 09/17/2023 Duration: 26.50 mins  Patient history:  62 y.o. 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 EEG to evaluate for seizure  Level of alertness:  comatose/ lethargic   AEDs during EEG study: LEV  Technical aspects: This EEG study was done with scalp electrodes positioned according to the 10-20 International system of electrode placement. Electrical activity was reviewed with band pass filter of 1-70Hz , sensitivity of 7 uV/mm, display speed of 51mm/sec with a 60Hz  notched filter applied as appropriate. EEG data were recorded continuously and digitally stored.  Video monitoring was available and reviewed as appropriate.  Description: EEG showed continuous generalized 3 to 6 Hz theta- delta slowing admixed with 12 to 15 Hz with activity.  Lateralized periodic discharges with overriding fast activity were noted in left hemisphere with fluctuating frequency of 0.5 to 2 Hz, at times with overlying rhythmicity, without definite evolution. Hyperventilation and photic stimulation were not performed.     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  This EEG pattern is on the ictal-interictal continuum with increased risk for seizures.  Additionally there is moderate to severe diffuse encephalopathy.  No definite seizures were seen during the study.  Marquetta Weiskopf Annabelle Harman

## 2023-09-18 NOTE — Consult Note (Signed)
Palliative Medicine Inpatient Consult Note  Consulting Provider: Alessandra Bevels, MD   Reason for consult:   Palliative Care Consult Services Symptom Management Consult   Palliative Medicine Consult  Reason for Consult? care goals, advanced malignancy   09/18/2023  HPI:  Per intake H&P --> [name] is a 62 y.o. 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  The Palliative care team has been asked to get involved to further assist with goals of care conversations.   Clinical Assessment/Goals of Care:  *Please note that this is a verbal dictation therefore any spelling or grammatical errors are due to the "[employer]agon [employer] One" system interpretation.  I have reviewed medical records including EPIC notes, labs and imaging, received report from bedside RN, assessed the patient who is lying in bed responsive though words at times sound gargled.    I met with [name], her daughter [name] (on speaker-phone), and her granddaughter [name] at bedside to further discuss diagnosis prognosis, GOC, EOL wishes, disposition and options.   I introduced Palliative Medicine as specialized medical care for people living with serious illness. It focuses on providing relief from the symptoms and stress of a serious illness. The goal is to improve quality of life for both the patient and the family.  Medical History Review and Understanding:  Reviewed patient's past medical history significant for breast cancer, hypertension, COPD, sarcoidosis, non-small cell lung cancer with metastases to her spine and brain.  Social History:  [name] is from [facility].  She is not married.  She has 4 children-3 daughters and 1 son, 8 grandchildren, and 8 great-grandchildren.  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  She is identified to love spending time with her family, watching television, and knitting.  She is a woman of the [place] faith.  Functional and Nutritional State:  Preceding hospitalization [name] was living in an apartment with her grandson, [name].  She was able to perform all BADLs on her own.  She did have a fairly decent appetite.  Palliative Symptoms:  Patient denies shortness of breath, nausea, pain, and constipation.  Advance Directives:  A detailed discussion was had today regarding advanced directives.  Patient has formal advanced directives indicating that her daughters, [name] and [name] are her surrogate decision makers.  Code Status:  Concepts specific to code status, artifical feeding and hydration, continued IV antibiotics and rehospitalization was had.  The difference between a aggressive medical intervention path  and a palliative comfort care path for this patient at this time was had.   [name] is an established DO NOT RESUSCITATE DO NOT INTUBATE CODE STATUS  Discussion:  We discussed [name]'s non-small cell lung cancer.  We reviewed that she had already been through palliative radiation.  Patient's daughter shares that she would like the opportunity to talk more to [name] in regards to whether or not she wants to pursue chemotherapy for which she is supposed to have 4 treatments total.  We reviewed that chemotherapy can of benefits but can also have great  burdens  We reviewed if it is established that patient does not want chemotherapy then we can proceed with further conversations  related to hospice care.  Patient's daughter is an Char[employer] fundraiser and understands what hospice care is as does [name] herself.  Plan at this point in time is to continue treatment to see if [name]'s  mental state can clear further so additional goals of care decisions can be made.  Discussed the importance of continued conversation with family and their  medical providers regarding overall plan of care and treatment options, ensuring decisions are within the context of the patients values and GOCs.  Decision Maker: [name]: Daughter [phone]   SUMMARY OF RECOMMENDATIONS   DNAR/DNI  Open and honest conversations held in the setting of patient's disease burden  Allowing time to see if patient's mental state can clear further  Patient's daughter would like to discuss with patient once mentally clear whether or not she would like to pursue chemotherapy  Discussed hospice as a consideration patient does not desire chemotherapy  Ongoing palliative care support  Code Status/Advance Care Planning: DNAR/DNI   Symptom Management:   Palliative Prophylaxis:  Aspiration, Bowel Regimen, Delirium Protocol, Frequent Pain Assessment, Oral Care, Palliative Wound Care, and Turn Reposition  Additional Recommendations (Limitations, Scope, Preferences): Continue current care  Psycho-social/Spiritual:  Desire for further Chaplaincy support: Yes Additional Recommendations: Discussions on whether or not patient would like chemotherapy   Prognosis: Unclear at this time  Discharge Planning: Discharge plan is uncertain -PT and OT are pending  Vitals:   09/17/23 2025 09/18/23 0341  BP: 114/70 131/82  Pulse: (!) 102 96  Resp:  19  Temp:  97.9 F (36.6 C)  SpO2:  100%    Intake/Output Summary (Last 24 hours) at 09/18/2023 0750 Last data filed at 09/18/2023 0981 Gross per 24 hour  Intake 1151.62 ml  Output --  Net 1151.62 ml   Last Weight  Most recent update: 09/17/2023 11:42 AM    Weight  74.4 kg (164 lb)            Gen: Elderly African-American female in no acute distress HEENT: moist mucous membranes CV: Irregular rate and rhythm PULM: On 2 L nasal cannula  breathing is even and nonlabored ABD: soft/nontender EXT: No edema Neuro: Alert and oriented x2  PPS: 40%   This conversation/these recommendations were discussed with patient primary care team, Dr. Renford Dills  Billing based on MDM: High  Problems Addressed: One acute or chronic illness or injury that poses a threat to life or bodily function  Amount and/or Complexity of Data: Category 3:Discussion of management or test interpretation with external physician/other qualified health care professional/appropriate source (not separately reported)  Risks: Decision regarding hospitalization or escalation of hospital care and Decision not to resuscitate or to de-escalate care because of poor prognosis ______________________________________________________ Lamarr Lulas [place] Palliative Medicine Team Team Cell Phone: [phone] Please utilize secure chat with additional questions, if there is no response within 30 minutes please call the above phone number  Palliative Medicine Team providers are available by phone from 7am to 7pm daily and can be reached through the team cell phone.  Should this patient require assistance outside of these hours, please call the patient's attending physician.

## 2023-09-18 NOTE — Progress Notes (Signed)
Radiation Oncology         [phone] ________________________________  Name: [name] MRN: [id number]  Date: 09/17/2023  DOB: 1961/03/04  Chart Note: This patient is well known to our service and we were made aware of her recent admission. We reviewed this patient's most recent findings and wanted to take a minute to document our impression.  DIAGNOSIS: 62 y.o. 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   In summary, we initially met her on 07/23/2023 at the request of Dr. Jordan Likes, to discuss treatment of a solitary brain metastasis found at the time of her recent ER visit on 06/26/23 when she presented with an acute onset of left arm numbness and paresthesias. An MRI of the brain showed a 1 cm cystic mass involving the right parietal lobe and postcentral gyrus along with vasogenic edema without midline shift was seen. CT of the chest on admission showed a 6.4 cm x 4.9 cm lobulated right apical mass with a T3 lesion suspicious for osseous metastatic disease. She was admitted for further workup and seen by neurosurgeon, Dr. Jordan Likes who felt that the patient's brain lesion was most likely a metastatic lesion and based on the location of the lesion being in an eloquent region of the brain, did not recommend biopsy or resection but rather treat this with [facility] as an outpatient. She was started on decadron and discharged home on 06/28/23.    Pulmonology was consulted and recommended outpatient bronchoscopy for biopsy of the RUL lung mass which was performed on 07/09/23 under the care of Dr. [name]. Final surgical pathology revealed squamous cell carcinoma.  She had a 3T MRI brain on 07/20/23 which confirmed a 1.1 cm metastatic lesion in the right postcentral gyrus similar in size to the prior study with unchanged surrounding edema and no new lesions seen. We met with the patient in consult on 07/23/23 and she elected to proceed with the recommended  single fraction stereotactic radiosurgery [facility]).   She met with Dr. Arbutus Ped on 07/26/2023 to discuss her treatment options but her staging had not yet been completed as her PET scan was scheduled for 08/03/23. Her SRS brain treatment was completed on 07/30/23 and tolerated well. She tapered off the decadron without issue and is currently without complaints. Her PET scan showed intense tracer avidity in the RUL lung mass, consistent with primary bronchogenic carcinoma but no tracer avid mediastinal or hilar lymph nodes.  There was a solitary tracer avid skeletal lesion involving the T1 vertebra, consistent with osseous metastatic disease but no additional sites of metastatic disease identified. We met back with the patient on 08/08/23 to discuss the option for ultra hypo fractionated radiotherapy (UHRT) to the RUL lung lesion and stereotactic radiotherapy (SRT) to the T1 lesion prior to starting systemic therapy, which she was in agreement with. She completed an 8 fraction course of UHRT to the lung and 5 fraction SRT to T1 on 08/28/23.   Unfortunately, she was readmitted to the hospital on 08/27/23 with failure to thrive, confusion and AKI. MRI brain showed 2 new enhancing lesions in the left temporal and occipital lobes so she was started on Decadron and the lesions were subsequently treated with a single fraction of SRS on 09/10/23. She was given a steroid taper on 09/10/23 and expected to start systemic therapy on 09/17/23. However, she presented back to the ED 09/17/23 with altered mental status. The patient's daughter and sister went  to check on her that morning and found patient to be sitting on edge of the bed slumped over, unresponsive with frothing at the right side of her mouth. According to the niece there was also concern for some left-sided weakness but no tonic-clonic movements or incontinence or tongue bite noted. On arrival patient noted to have rhythmic movements of her lower jaw with altered mental  status and there was concern for seizures. Patient received 2 mg IV Ativan after which she has been somnolent. CT head obtained showed similar findings of prior MRI with multiple brain mets and surrounding edema. She was started on IV Decadron and Keppra and MRI brain showed an unchanged size of the previously treated contrast-enhancing masses of the left temporal lobe and left occipital lobe with progression of surrounding vasogenic edema and a slight decreased size of the previously treated right postcentral gyrus metastasis. There was noted to be an increased size of a 7 mm extra-axial contrast-enhancing mass at the left cerebellopontine angle, previously measuring 3 mm by this report but not mentioned on prior reports- likely an untreated metastasis.  PREVIOUS RADIATION THERAPY: Yes  09/10/23: SRS// The new lesions in the left temporal and left occipital lobes were treated to 20 Gy in a single fraction of SRS.  08/13/23 - 08/28/23: UHRT// The RUL lung mass was treated to 60 Gy in 8 fractions of 7.5 Gy each while simultaneously treating the TI lesion with SRT, delivering 50 Gy in 5 fractions of 10 Gy each  07/30/23: The solitary brain metastasis in the right postcentral gyrus was treated to 20 Gy in a single fraction of SRS.   2016: Patient received 50.05 Gy in 20 fractions to her right breast under the care of Dr. Basilio Cairo  Lab Findings: Lab Results  Component Value Date   WBC 10.7 (H) 09/17/2023   HGB 11.5 (L) 09/17/2023   HCT 36.5 09/17/2023   MCV 83.1 09/17/2023   PLT 162 09/17/2023    @LASTCHEM @  Radiographic Findings: Overnight EEG with video  Result Date: 09/18/2023 Charlsie Quest, MD     09/18/2023  8:37 AM Patient Name: [name] MRN: [id number] Epilepsy Attending: Charlsie Quest Referring Physician/Provider: Marjorie Smolder, NP Duration: 09/18/2023 0020 to 0830  Patient history:  62 y.o. female with history of stage IV non-small cell lung cancer with mets to the brain  status post stereotactic radiosurgery on 10/28, breast cancer, GERD, depression, COPD, hypertension, anemia, sarcoidosis and sickle cell trait who presents with altered mental status and decreased responsiveness as of this morning. EEG to evaluate for seizure  Level of alertness:  comatose/ lethargic-->awake  AEDs during EEG study: LEV  Technical aspects: This EEG study was done with scalp electrodes positioned according to the 10-20 International system of electrode placement. Electrical activity was reviewed with band pass filter of 1-70Hz , sensitivity of 7 uV/mm, display speed of 67mm/sec with a 60Hz  notched filter applied as appropriate. EEG data were recorded continuously and digitally stored.  Video monitoring was available and reviewed as appropriate.  Description: EEG initially showed lateralized periodic discharges ( LPDs) with overriding fast activity were noted in left hemisphere with fluctuating frequency of 0.5 to 2 Hz, at times with overlying rhythmicity, without definite evolution. EEG also showed continuous generalized 3 to 6 Hz theta- delta slowing admixed with 12 to 15 Hz with activity. Gradually, the morphology of LPDs improved and there was no overriding fast activity.  Additionally the frequency of LPDs improved to 0.25-0.5Hz .  EEG also showed posterior dominant rhythm of 8 Hz activity of moderate voltage (25-35 uV) seen predominantly in posterior head regions, symmetric and reactive to eye opening and eye closing.  Intermittent generalized and lateralized left hemisphere 3-6 Esti Deppen delta slowing was also noted.  Hyperventilation and photic stimulation were not performed.    ABNORMALITY - Lateralized periodic discharges ( LPD), left hemisphere - Continuous slow, generalized and lateralized left hemisphere  IMPRESSION: This study initially showed evidence of epileptogenicity arising from left hemisphere with increased risk of seizure recurrence.  Additionally there was moderate diffuse  encephalopathy.  Gradually, EEG improved and was suggestive of cortical dysfunction in left hemisphere likely secondary to underlying structural abnormality as well as mild diffuse encephalopathy.  No seizures were seen during the study.  Charlsie Quest   EEG adult  Result Date: 09/18/2023 Charlsie Quest, MD     09/18/2023  8:33 AM Patient Name: DARYL QUIROS MRN: [id number] Epilepsy Attending: Charlsie Quest Referring Physician/Provider: Alessandra Bevels, MD Date: 09/17/2023 Duration: 26.50 mins Patient history:  62 y.o. 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 EEG to evaluate for seizure Level of alertness:  comatose/ lethargic AEDs during EEG study: LEV Technical aspects: This EEG study was done with scalp electrodes positioned according to the 10-20 International system of electrode placement. Electrical activity was reviewed with band pass filter of 1-70Hz , sensitivity of 7 uV/mm, display speed of 60mm/sec with a 60Hz  notched filter applied as appropriate. EEG data were recorded continuously and digitally stored.  Video monitoring was available and reviewed as appropriate. Description: EEG showed continuous generalized 3 to 6 Hz theta- delta slowing admixed with 12 to 15 Hz with activity.  Lateralized periodic discharges with overriding fast activity were noted in left hemisphere with fluctuating frequency of 0.5 to 2 Hz, at times with overlying rhythmicity, without definite evolution. Hyperventilation and photic stimulation were not performed.   ABNORMALITY -Lateralized periodic discharges with overriding fast activity ( LPD+F), left hemisphere - Continuous slow, generalized IMPRESSION: This study showed evidence of epileptogenicity  arising from left hemisphere.  This EEG pattern is on the ictal-interictal continuum with increased risk for seizures.  Additionally there is moderate to severe diffuse encephalopathy.  No definite seizures were seen during the study. Charlsie Quest   MR BRAIN W WO CONTRAST  Result Date: 09/17/2023 CLINICAL DATA:  Altered mental status and seizure. Brain metastases. EXAM: MRI HEAD WITHOUT AND WITH CONTRAST TECHNIQUE: Multiplanar, multiecho pulse sequences of the brain and surrounding structures were obtained without and with intravenous contrast. CONTRAST:  7mL GADAVIST GADOBUTROL 1 MMOL/ML IV SOLN COMPARISON:  08/29/2023 FINDINGS: Brain: There is abnormal diffusion restriction within the medial left PCA territory, including the dorsomedial left thalamus. There is also diffusion restriction of the left insula. There is vasogenic edema within the anterior left temporal lobe and left occipital lobe, near the regions of diffusion restriction. There contrast-enhancing masses at both locations. The left temporal mass measures 10 mm, unchanged (series 19, image 27). Left occipital mass measures 10 mm, unchanged (image 23). Lesion of the right postcentral gyrus measures 7 mm, previously 9 mm (image 48). There is a focus of likely extra-axial contrast enhancement at the left cerebellopontine angle that measures 7 mm, previously 3 mm (series 19, image  18). The midline structures are normal. Vascular: Normal flow voids. Skull and upper cervical spine: Normal calvarium and skull base. Visualized upper cervical spine and soft tissues are normal. Sinuses/Orbits:No paranasal sinus fluid levels or advanced mucosal thickening. No mastoid or middle ear effusion. Normal orbits. IMPRESSION: 1. Unchanged size of contrast-enhancing masses of the left temporal lobe and left occipital lobe with progression of surrounding vasogenic edema. 2. Areas of diffusion restriction of the left insula and left PCA territory, near the above  described lesions, may be a postictal phenomenon. 3. Slight decrease in size of the right postcentral gyrus metastasis. 4. Increased size of a 7 mm extra-axial contrast-enhancing mass at the left cerebellopontine angle, which could be a meningioma or extra-axial metastasis. Electronically Signed   By: Deatra Robinson M.D.   On: 09/17/2023 19:51   DG Chest Port 1 View  Result Date: 09/17/2023 CLINICAL DATA:  Altered mental status.  Lung cancer. EXAM: PORTABLE CHEST 1 VIEW COMPARISON:  08/27/2023. FINDINGS: There is a right chest wall port a catheter with tip in the distal SVC. Stable cardiomediastinal contours. The right upper lobe treated lung mass is again noted with signs of central cavitary change. There is asymmetric right lung volume loss with increase opacification in the right lower lung compared with the previous exam. Left lung appears clear. IMPRESSION: 1. Increased opacification in the right lower lung compared with the previous exam. Atelectasis and or pleural effusion. Underlying pneumonia would be difficult to exclude. 2. Stable appearance of right upper lobe lung mass with central cavitation. Electronically Signed   By: Signa Kell M.D.   On: 09/17/2023 15:41   CT HEAD WO CONTRAST  Result Date: 09/17/2023 CLINICAL DATA:  Mental status change, unknown cause EXAM: CT HEAD WITHOUT CONTRAST TECHNIQUE: Contiguous axial images were obtained from the base of the skull through the vertex without intravenous contrast. RADIATION DOSE REDUCTION: This exam was performed according to the departmental dose-optimization program which includes automated exposure control, adjustment of the mA and/or kV according to patient size and/or use of iterative reconstruction technique. COMPARISON:  Brain MR 08/29/23 FINDINGS: Brain: No hemorrhage. No hydrocephalus. No extra-axial fluid collection. No CT evidence of an acute cortical infarct. Redemonstrated is vasogenic edema at sites of known metastatic disease in the  anterior left temporal lobe and in the left occipital lobe. There is no definite evidence of new metastatic disease on this noncontrast enhanced exam. Vascular: No hyperdense vessel or unexpected calcification. Skull: Normal. Negative for fracture or focal lesion. Sinuses/Orbits: No acute finding. Other: None. IMPRESSION: 1. No CT evidence of an acute cortical infarct or intracranial hemorrhage. 2. Redemonstrated vasogenic edema at sites of known metastatic disease in the anterior left temporal lobe and in the left occipital lobe. There is no definite evidence of new metastatic disease on this noncontrast enhanced exam. If more detailed characterization for new metastatic disease is clinically warranted, further evaluation with repeat brain MRI recommended. Electronically Signed   By: Lorenza Cambridge M.D.   On: 09/17/2023 14:59   MR BRAIN W WO CONTRAST  Result Date: 08/30/2023 CLINICAL DATA:  Metastatic disease evaluation EXAM: MRI HEAD WITHOUT AND WITH CONTRAST TECHNIQUE: Multiplanar, multiecho pulse sequences of the brain and surrounding structures were obtained without and with intravenous contrast. CONTRAST:  7.63mL GADAVIST GADOBUTROL 1 MMOL/ML IV SOLN COMPARISON:  07/20/2023 FINDINGS: Brain: Redemonstrated peripherally enhancing lesion in the right postcentral gyrus, which now measures up to 9 x 9 mm (series 16, image 127), previously 13 x 12 mm slightly  decreased associated edema. New peripherally enhancing lesion in the left temporal lobe, which measures up to 10 x 8 mm (series 16, image 56). Additional peripherally enhancing lesion in the left occipital lobe measures up to 10 x 9 mm (series 16, image 56). The lesions are associated with moderate surrounding edema. No evidence of acute infarct chin, hemorrhage, mass effect, or midline shift. No hydrocephalus or extra-axial collection. Vascular: Normal arterial flow voids. Normal arterial and venous enhancement. Skull and upper cervical spine: Normal marrow  signal. Sinuses/Orbits: Clear paranasal sinuses. Status post bilateral lens replacements. Other: The mastoids are well aerated. IMPRESSION: 1. New peripherally enhancing lesions in the left temporal and occipital lobes, consistent with new metastatic disease. 2. Slightly decrease in the size of a previously noted right postcentral gyrus lesion, with decreased associated edema. Electronically Signed   By: Wiliam Ke M.D.   On: 08/30/2023 04:19   CT Head Wo Contrast  Result Date: 08/27/2023 CLINICAL DATA:  Mental status change, unknown cause EXAM: CT HEAD WITHOUT CONTRAST TECHNIQUE: Contiguous axial images were obtained from the base of the skull through the vertex without intravenous contrast. RADIATION DOSE REDUCTION: This exam was performed according to the departmental dose-optimization program which includes automated exposure control, adjustment of the mA and/or kV according to patient size and/or use of iterative reconstruction technique. COMPARISON:  MRI 07/20/2023. FINDINGS: Brain: Hypodensity in the left occipital region appears new. Similar edema in the right frontoparietal region. No substantial mass effect. No midline shift. No hydrocephalus. No acute hemorrhage. Vascular: No hyperdense vessel. Skull: No acute fracture. Sinuses/Orbits: Clear sinuses.  No acute orbital findings. Other: No mastoid effusions. IMPRESSION: 1. Hypodensity in the left occipital region could represent interval (since 07/20/2023) infarct or metastasis. Recommend MRI head with contrast to further evaluate. 2. Grossly similar edema in the right frontoparietal region correlates with known metastatic lesion seen on prior MRI. No progressive mass effect. Electronically Signed   By: Feliberto Harts M.D.   On: 08/27/2023 17:12   DG Chest Port 1 View  Result Date: 08/27/2023 CLINICAL DATA:  Weakness.  History of lung cancer EXAM: PORTABLE CHEST 1 VIEW COMPARISON:  X-ray 07/09/2023.  PET-CT scan 08/03/2023 FINDINGS: Volume  loss of the right hemithorax with slightly increasing opacity in the right upper lung compared to prior x-ray. Surgical clips in the right axillary region. Left lung is grossly clear. No pneumothorax, effusion or edema. Normal cardiopericardial silhouette. Interval placement of a right IJ chest port with the tip along the upper SVC. Overlapping cardiac leads. IMPRESSION: Chest port. Increasing parenchymal opacity in the right upper lung. Acute infiltrative process is possible. Please correlate for known history of a right upper lobe lung cancer. Electronically Signed   By: Karen Kays M.D.   On: 08/27/2023 14:33    Impression/Plan:  Her systemic treatment has been significantly delayed to date due to treatment of new brain metastases and her current symptoms are likely secondary to the post-treatment edema, not the small, new lesion at the right CPA, so in light of this information, we recommend restaging imaging of the chest to assess for overall disease progression. If there is no significant disease progression in the chest, we could potentially proceed with single fraction SRS treatment of the new lesion prior to starting chemotherapy, which we would discuss with Dr. Arbutus Ped. However, if there is significant disease progression in the chest, we would recommend proceeding with starting systemic therapy and repeat imaging at a short interval, with intent to treat the new  small lesion in the future pending response to systemic therapy.    Marguarite Arbour, PA-C    Margaretmary Dys, MD  [facility]  Radiation Oncology Direct Dial: [phone]  Fax: [phone] [email or web address]  Skype  LinkedIn

## 2023-09-18 NOTE — Progress Notes (Signed)
PROGRESS NOTE  [name]  WUX:[id number] DOB: 11-08-1961 DOA: 09/17/2023 PCP: [name]., FNP   Brief Narrative: Patient is a 62 year old female with history of stage IV non-small cell lung cancer with metastasis currently in chemo/radiation therapies, breast cancer, sarcoidosis, COPD, hypertension  who presented with altered mental status from home.  She was supposed to have palliative chemo/radiation therapy initiated  on 11/4.  Patient slumped over the bed, became unresponsive, there was concern for left-sided weakness.  At baseline, she uses walker for ambulation.  Found to have new mets in the brain as per MRI on 08/29/2023 and was on oral Decadron.  On presentation, she was hemodynamically stable.  She was noted to have rhythmic movement of her lower jaw, she was confused on presentation, so it was  concerning for seizure.  CT head/MRI brain showed multiple brain mets with surrounding edema.  Started on IV Decadron.  Neurology consulted.    Radiation oncology, palliative care also following.  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  MRI showed unchanged size of contrast-enhancing masses of the left temporal lobe and left occipital lobe with progression of surrounding vasogenic edema.  Monitor mental status.  Mildly elevated ammonia level.  Mental status has significantly improved this morning.  She  is almost fully oriented but still has some degree of postictal confusion.  Started on dysphagia 3 diet  Metastatic lung cancer to brain: Imagings  above.  Finding of worsening edema.  Started on IV Decadron.  She was following with oncology/radiation oncology.  Was planned to start on palliative chemoradiation. Radiation oncology consulted.  Oncology, Dr. Arbutus Ped recommends outpatient follow-up  Seizure: EEG obtained.  Neurology following.  Currently on Keppra.EEG obtained which showed evidence of etiopathogenicity arising from left hemisphere.  Chronic respiratory failure/COPD/sarcoidosis: On 2 L of oxygen at home.  Continue bronchodilators.  Currently not in exacerbation  Leukocytosis/elevated lactate: Could be reactive.  Low suspicion for infectious process.  UA did not show any evidence of UTI, chest x-ray showed right upper lobe mass with cavitation, right lower lobe atelectasis.  Pneumonia not excluded.  Started on antibiotic for possible post obstructive pneumonia, on ceftriaxone. lactate sepsis resolved, leukocytosis improved  Liver cirrhosis: Does not appear to be compensated.  Has mild elevated ammonia, started on low-dose lactulose  Hypokalemia: Supplemented and corrected.  GERD: Continue PPI  Depression/Anxiety: Takes meds at home.  Currently on hold  Generalized weakness: PT/OT consulted  Goals of care: History of metastatic lung cancer to brain.  Very poor prognosis.  Palliative care consulted for goals of care and following.  Family open for goals of care discussion.         DVT prophylaxis:enoxaparin (LOVENOX) injection 40 mg Start: 09/17/23 1800     Code Status: Limited: Do not attempt resuscitation (DNR) -DNR-LIMITED -Do Not Intubate/DNI   Family Communication: Discussed with granddaughter at bedside.  Called and discussed with her daughter [name] on  phone  Patient status:Inpatient  Patient is from :home  Anticipated discharge to:not sure  Estimated DC  date:not sure   Consultants: Neurology, palliative care, oncology, radiation oncology  Procedures: EEG  Antimicrobials:  Anti-infectives (From admission, onward)    Start     Dose/Rate Route Frequency Ordered Stop   09/17/23 1815  cefTRIAXone (ROCEPHIN) 1 g in sodium chloride 0.9 % 100 mL IVPB        1 g 200 mL/hr over 30 Minutes Intravenous Every 24 hours 09/17/23 1810         Subjective: Patient seen and examined at bedside today.  Hemodynamically stable lying in bed.  Appears overall comfortable.  She is alert, awake and mostly oriented.  Still has some degree of confusion.  Obeys commands well.  Has generalized weakness.  Speaks well.  Denies shortness of breath, chest pain or abdominal pain.  Objective: Vitals:   09/17/23 1715 09/17/23 1730 09/17/23 2025 09/18/23 0341  BP: 103/83 108/79 114/70 131/82  Pulse: 98 92 (!) 102 96  Resp: 17 18  19   Temp:    97.9 F (36.6 C)  TempSrc:   Oral Oral  SpO2: 100% 100%  100%  Weight:      Height:        Intake/Output Summary (Last 24 hours) at 09/18/2023 0749 Last data filed at 09/18/2023 0606 Gross per 24 hour  Intake 1151.62 ml  Output --  Net 1151.62 ml   Filed Weights   09/17/23 1142  Weight: 74.4 kg    Examination:  General exam: Overall comfortable, not in distress, weak HEENT: PERRL Respiratory system:  no wheezes or crackles  Cardiovascular system: S1 & S2 heard, RRR.  Gastrointestinal system: Abdomen is nondistended, soft and nontender. Central nervous system: Alert and awake, mostly oriented, generalized weakness Extremities: No edema, no clubbing ,no cyanosis Skin: No rashes, no ulcers,no icterus     Data Reviewed: I have personally reviewed following labs and imaging studies  CBC: Recent Labs  Lab 09/17/23 1139 09/17/23 1144 09/17/23 1146 09/17/23 2006  WBC 15.7*  --   --  10.7*  NEUTROABS 13.5*  --   --   --   HGB 11.3* 12.2 12.6 11.5*  HCT 36.7 36.0 37.0 36.5  MCV 85.0  --   --  83.1  PLT  192  --   --  162   Basic Metabolic Panel: Recent Labs  Lab 09/17/23 1139 09/17/23 1144 09/17/23 1146 09/17/23 2006 09/18/23 0437  NA 146* 141 141  --  143  K 3.2* 3.0* 3.1*  --  3.6  CL 93* 90*  --   --  96*  CO2 35*  --   --   --  33*  GLUCOSE 145* 147*  --   --  128*  BUN 10 11  --   --  17  CREATININE 1.04* 0.90  --  1.00 1.16*  CALCIUM 8.7*  --   --   --  8.0*     No results found for this or any previous visit (from the past 240 hour(s)).   Radiology Studies: MR BRAIN W WO CONTRAST  Result Date: 09/17/2023 CLINICAL DATA:  Altered mental status and seizure. Brain metastases. EXAM: MRI HEAD WITHOUT AND WITH CONTRAST TECHNIQUE: Multiplanar, multiecho pulse sequences of the brain and surrounding structures were obtained without and with intravenous contrast. CONTRAST:  7mL GADAVIST GADOBUTROL 1 MMOL/ML IV SOLN COMPARISON:  08/29/2023 FINDINGS: Brain: There is abnormal diffusion restriction within the medial left  PCA territory, including the dorsomedial left thalamus. There is also diffusion restriction of the left insula. There is vasogenic edema within the anterior left temporal lobe and left occipital lobe, near the regions of diffusion restriction. There contrast-enhancing masses at both locations. The left temporal mass measures 10 mm, unchanged (series 19, image 27). Left occipital mass measures 10 mm, unchanged (image 23). Lesion of the right postcentral gyrus measures 7 mm, previously 9 mm (image 48). There is a focus of likely extra-axial contrast enhancement at the left cerebellopontine angle that measures 7 mm, previously 3 mm (series 19, image 18). The midline structures are normal. Vascular: Normal flow voids. Skull and upper cervical spine: Normal calvarium and skull base. Visualized upper cervical spine and soft tissues are normal. Sinuses/Orbits:No paranasal sinus fluid levels or advanced mucosal thickening. No mastoid or middle ear effusion. Normal orbits. IMPRESSION: 1.  Unchanged size of contrast-enhancing masses of the left temporal lobe and left occipital lobe with progression of surrounding vasogenic edema. 2. Areas of diffusion restriction of the left insula and left PCA territory, near the above described lesions, may be a postictal phenomenon. 3. Slight decrease in size of the right postcentral gyrus metastasis. 4. Increased size of a 7 mm extra-axial contrast-enhancing mass at the left cerebellopontine angle, which could be a meningioma or extra-axial metastasis. Electronically Signed   By: Deatra Robinson M.D.   On: 09/17/2023 19:51   DG Chest Port 1 View  Result Date: 09/17/2023 CLINICAL DATA:  Altered mental status.  Lung cancer. EXAM: PORTABLE CHEST 1 VIEW COMPARISON:  08/27/2023. FINDINGS: There is a right chest wall port a catheter with tip in the distal SVC. Stable cardiomediastinal contours. The right upper lobe treated lung mass is again noted with signs of central cavitary change. There is asymmetric right lung volume loss with increase opacification in the right lower lung compared with the previous exam. Left lung appears clear. IMPRESSION: 1. Increased opacification in the right lower lung compared with the previous exam. Atelectasis and or pleural effusion. Underlying pneumonia would be difficult to exclude. 2. Stable appearance of right upper lobe lung mass with central cavitation. Electronically Signed   By: Signa Kell M.D.   On: 09/17/2023 15:41   CT HEAD WO CONTRAST  Result Date: 09/17/2023 CLINICAL DATA:  Mental status change, unknown cause EXAM: CT HEAD WITHOUT CONTRAST TECHNIQUE: Contiguous axial images were obtained from the base of the skull through the vertex without intravenous contrast. RADIATION DOSE REDUCTION: This exam was performed according to the departmental dose-optimization program which includes automated exposure control, adjustment of the mA and/or kV according to patient size and/or use of iterative reconstruction technique.  COMPARISON:  Brain MR 08/29/23 FINDINGS: Brain: No hemorrhage. No hydrocephalus. No extra-axial fluid collection. No CT evidence of an acute cortical infarct. 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 There is no definite evidence of new metastatic disease on this noncontrast enhanced exam. Vascular: No hyperdense vessel or unexpected calcification. Skull: Normal. Negative for fracture or focal lesion. Sinuses/Orbits: No acute finding. Other: None. IMPRESSION: 1. No CT evidence of an acute cortical infarct or intracranial hemorrhage. 2. Redemonstrated vasogenic edema at sites of known metastatic disease in the anterior left temporal lobe and in the left occipital lobe. There is no definite evidence of new metastatic disease on this noncontrast enhanced exam. If more detailed characterization for new metastatic disease is clinically warranted, further evaluation with repeat  brain MRI recommended. Electronically Signed   By: Lorenza Cambridge M.D.   On: 09/17/2023 14:59    Scheduled Meds:  aspirin  300 mg Rectal Daily   dexamethasone (DECADRON) injection  4 mg Intravenous Q6H   enoxaparin (LOVENOX) injection  40 mg Subcutaneous Q24H   pantoprazole (PROTONIX) IV  40 mg Intravenous Q24H   Continuous Infusions:  cefTRIAXone (ROCEPHIN)  IV 1 g (09/17/23 2042)   lactated ringers 75 mL/hr at 09/17/23 2039   levETIRAcetam 500 mg (09/18/23 0250)     LOS: 1 day   Burnadette Pop, MD [facility] P11/03/2023, 7:49 AM

## 2023-09-18 NOTE — Progress Notes (Signed)
NEUROLOGY CONSULT FOLLOW UP NOTE   Date of service: September 18, 2023 Patient Name: [name] MRN:  [id number] DOB:  04/08/1961  Brief HPI  [name] is a 62 y.o. female with history of stage IV non-small cell lung cancer with mets to the brain status post stereotactic radiosurgery on 10/28, breast cancer, GERD, depression, COPD, hypertension, anemia, sarcoidosis and sickle cell trait who presents with altered mental status and decreased responsiveness.  According to family, she is normally alert and oriented x 4 with some forgetfulness and is able to care for herself and ambulate.  She had been feeling unwell and fatigued for a few days but yesterday morning was noted to be nonverbal and nonresponsive with bilateral hand tremors and right facial droop with drooling from the right side of the mouth.  CT head revealed vasogenic edema at site of known brain mets and left temporal lobe and left occipital lobe.  She was given Ativan and loaded with Keppra on arrival to the ED.     Interval Hx/subjective   Patient is awake, alert and oriented x 4 and conversant but not back to her baseline per family.  Long-term EEG shows left hemisphere LPD's, so Keppra will be increased.  Vitals   Vitals:   09/17/23 1715 09/17/23 1730 09/17/23 2025 09/18/23 0341  BP: 103/83 108/79 114/70 131/82  Pulse: 98 92 (!) 102 96  Resp: 17 18  19   Temp:    97.9 F (36.6 C)  TempSrc:   Oral Oral  SpO2: 100% 100%  100%  Weight:      Height:         Body mass index is 27.29 kg/m.  Physical Exam   Constitutional: Appears well-developed and well-nourished.  Psych: Affect appropriate to situation.  Eyes: No scleral injection.  HENT: No OP obstrucion.  Head: Normocephalic.  Respiratory: Effort normal, non-labored breathing.  Skin: WDI.   Neurologic Examination    NEURO:  Mental Status: Alert and oriented to person place time and situation, able to follow simple and two-step commands Speech/Language:  speech is with mild dysarthria  Cranial Nerves:  II: PERRL.  III, IV, VI: EOMI. Eyelids elevate symmetrically.  V: Sensation is intact to light touch and symmetrical to face.  VII: Smile is symmetrical.  VIII: hearing intact to voice. IX, X: Voice is mildly dysarthric YQ:[id number] shrug 5/5. XII: tongue is midline without fasciculations. Motor: 5/5 strength to all muscle groups tested.  Tone: is normal and bulk is normal Sensation- Intact to light touch bilaterally.  Coordination: FTN intact bilaterally.No drift.  Gait- deferred    Labs and Diagnostic Imaging   CBC:  Recent Labs  Lab 09/17/23 1139 09/17/23 1144 09/17/23 1146 09/17/23 2006  WBC 15.7*  --   --  10.7*  NEUTROABS 13.5*  --   --   --   HGB 11.3*   < > 12.6 11.5*  HCT 36.7   < > 37.0 36.5  MCV 85.0  --   --  83.1  PLT 192  --   --  162   < > = values in this interval not displayed.    Basic Metabolic Panel:  Lab Results  Component Value Date   NA 143 09/18/2023   K 3.6 09/18/2023   CO2 33 (H) 09/18/2023   GLUCOSE 128 (H) 09/18/2023   BUN 17 09/18/2023   CREATININE 1.16 (H) 09/18/2023   CALCIUM 8.0 (L) 09/18/2023   GFRNONAA 53 (L) 09/18/2023   GFRAA >60  02/21/2019   Lipid Panel:  Lab Results  Component Value Date   LDLCALC 76 08/30/2018   HgbA1c:  Lab Results  Component Value Date   HGBA1C 5.9 02/04/2015   Urine Drug Screen:     Component Value Date/Time   LABOPIA NONE DETECTED 09/17/2023 1306   COCAINSCRNUR NONE DETECTED 09/17/2023 1306   LABBENZ NONE DETECTED 09/17/2023 1306   AMPHETMU NONE DETECTED 09/17/2023 1306   THCU NONE DETECTED 09/17/2023 1306   LABBARB NONE DETECTED 09/17/2023 1306    Alcohol Level     Component Value Date/Time   ETH <10 09/17/2023 1140   INR No results found for: "INR" APTT No results found for: "APTT" AED levels: No results found for: "PHENYTOIN", "ZONISAMIDE", "LAMOTRIGINE", "LEVETIRACETA"  CT Head without contrast(Personally reviewed): Vasogenic  edema at sites of known metastatic disease in the anterior left temporal lobe and left occipital lobe   MRI Brain(Personally reviewed): Unchanged size of contrast enhancing masses in left temporal and occipital lobe with progression of surrounding vasogenic edema with areas of diffusion restriction in left insula and left PCA territory, possibly postictal phenomenon, slight decrease in size of right postcentral gyrus metastasis, increased size of 7 mm extra-axial contrast-enhancing mass of left cerebellopontine angle  rEEG:  LPD's in left hemisphere with continuous slow generalized and lateralized left hemisphere with epileptogenicity arising from left hemisphere with increased risk of seizure recurrence  Impression   [name] is a 62 y.o. female  with history of stage IV non-small cell lung cancer with mets to the brain status post stereotactic radiosurgery on 10/28, breast cancer, GERD, depression, COPD, hypertension, anemia, sarcoidosis and sickle cell trait who presents with altered mental status and decreased responsiveness along with bilateral hand tremors.  CT head and MRI brain showed progression of vasogenic edema at sites of known brain metastases as well as possible postictal diffusion restriction around known brain mets.  Patient was given Ativan in the ED and loaded with Keppra, and EEG demonstrates LPD's in the left hemisphere.  Will increase Keppra to 750 mg twice daily today given continued epileptogenic activity on EEG.  Patient's mental status is much improved today, she is alert and oriented to person place time and situation but not quite back to her baseline per family. Will d/c EEG given improvement in mental status and she may be followed clinically.  Recommendations  -Increase Keppra to 750 mg twice daily, continue this at discharge -Continue dexamethasone 4 mg every 6 hours -D/c LTM EEG - No driving x6 mos after last seizure - Outpatient f/u with neurology  We will  be available prn for questions going forward. ______________________________________________________________________   Thank you for the opportunity to take part in the care of this patient. If you have any further questions, please contact the neurology consultation team on call. Updated oncall schedule is listed on AMION.  Patient seen by NP and then by MD, MD to edit note as needed.  Signed,  Cortney E Ernestina Columbia , MSN, AGACNP-BC [facility] See Amion for schedule and pager information 09/18/2023 9:32 AM

## 2023-09-18 NOTE — TOC CM/SW Note (Addendum)
Transition of Care [facility]) - Inpatient Brief Assessment   Patient Details  Name: [name] MRN: [id number] Date of Birth: 1961/06/02  [facility] of [facility]) CM/SW Contact:    Tom-Johnson, Hershal Coria, RN Phone Number: 09/18/2023, 1:53 PM   Clinical Narrative:  Patient presented to the ED with Altered Mental Status after being found unresponsive, slumped over sitting on edge of the bed with frothing at the Rt side of her mouth.  Has hx of Breast Cancer stage IA, Stage IV Non-small Cell Lung Cancer (RUL ,solitary Brain and T1 Bone Metastasis) followed by Oncology for Palliative Chemo/Radiation Therapies, COPD Chronic Respiratory Failure requiring 2 L O2 at baseline from [facility].  On Continuous EEG for Seizures, Neurology following. Palliative following for GOC.      From home with grandson, has four supportive children. Independent at baseline but family assists as needed. Family transports to and from appointments.  Has a rollator and shower seat at home. Daughter, [facility] requesting Hospital bed and wheelchair at discharge. MD notified. Order called in to [facility] to deliver to patient's home. PCP is [name]., FNP and uses [facility] on [address].  Awaiting PT/OT eval for disposition.  Patient not Medically ready for discharge.  CM will continue to follow as patient progresses with care towards discharge.          Transition of Care Asessment: Insurance and Status: Insurance coverage has been reviewed Patient has primary care physician: Yes Home environment has been reviewed: Yes Prior level of function:: Modified Independent Prior/Current Home Services: No current home services Social Determinants of Health Reivew: SDOH reviewed no interventions necessary Readmission risk has been reviewed: Yes Transition of care needs: transition of care needs identified, TOC will continue to follow

## 2023-09-18 NOTE — Evaluation (Signed)
Physical Therapy Evaluation Patient Details Name: [name] MRN: [id number] DOB: 1961/01/21 Today's Date: 09/18/2023  History of Present Illness  62 yo female presenting to ED 11/4 with AMS, global aphasia, and R facial droop. EEG with evidence of  epileptogenicity arising from L hemisphere.  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, solitary brain and T1 bone metastasis)  Clinical Impression   Pt admitted secondary to problem above with deficits below. PTA patient was living with her children in a first floor apartment with 7 steps to enter. She uses a rollator and recently having difficulty with stairs.  Pt currently requires CGA for transfers and is modified independent with bed mobility. Unable to assess gait at this time. Will plan to see 09/19/23 to continue assessment and make final recommendations.  Anticipate patient will benefit from PT to address problems listed below.Will continue to follow acutely to maximize functional mobility independence and safety.           If plan is discharge home, recommend the following: A little help with walking and/or transfers;Direct supervision/assist for medications management;Direct supervision/assist for financial management;Assist for transportation;Help with stairs or ramp for entrance;Supervision due to cognitive status   Can travel by private vehicle        Equipment Recommendations Wheelchair (measurements PT);Hospital bed  Recommendations for Other Services  OT consult    Functional Status Assessment Patient has had a recent decline in their functional status and demonstrates the ability to make significant improvements in function in a reasonable and predictable amount of time.     Precautions / Restrictions Precautions Precautions: Fall Precaution Comments: O2 dependent at baseline Restrictions Weight Bearing  Restrictions: No      Mobility  Bed Mobility Overal bed mobility: Modified Independent Bed Mobility: Supine to Sit, Sit to Supine     Supine to sit: Modified independent (Device/Increase time) Sit to supine: Modified independent (Device/Increase time)   General bed mobility comments: incr time and effort    Transfers Overall transfer level: Needs assistance Equipment used: None Transfers: Sit to/from Stand, Bed to chair/wheelchair/BSC Sit to Stand: Contact guard assist   Step pivot transfers: Contact guard assist       General transfer comment: from bed to bsc to bed    Ambulation/Gait               General Gait Details: unable to assess as no RW in room and no portable O2 carriers on unit  Stairs            Wheelchair Mobility     Tilt Bed    Modified Rankin (Stroke Patients Only)       Balance Overall balance assessment: Needs assistance Sitting-balance support: No upper extremity supported, Feet supported Sitting balance-Leahy Scale: Good     Standing balance support: No upper extremity supported, During functional activity Standing balance-Leahy Scale: Fair                               Pertinent Vitals/Pain Pain Assessment Pain Assessment: No/denies pain    Home Living Family/patient expects to be discharged to:: Private residence Living Arrangements: Children Available Help at Discharge: Family Type of Home: Apartment Home Access: Stairs to enter Entrance Stairs-Rails: Can reach both Entrance Stairs-Number of Steps: 7   Home Layout: One level Home Equipment: Rollator (4 wheels);Shower seat Additional Comments:  on 2L at home    Prior Function Prior Level of Function : Needs assist             Mobility Comments: reports using rollator recently; stairs have become difficult, with a wheelchair there is a way they could go around through the grass and avoid the stairs in/out of apartment ADLs Comments: modified  independent showering (seat) and ind dressing; still cooking     Extremity/Trunk Assessment   Upper Extremity Assessment Upper Extremity Assessment: Defer to OT evaluation    Lower Extremity Assessment Lower Extremity Assessment: Generalized weakness (symmetric R vs L; grossly 4/5)    Cervical / Trunk Assessment Cervical / Trunk Assessment: Normal  Communication   Communication Communication: Difficulty communicating thoughts/reduced clarity of speech (dysarthric) Cueing Techniques: Verbal cues;Tactile cues  Cognition Arousal: Alert Behavior During Therapy: Flat affect Overall Cognitive Status: Impaired/Different from baseline Area of Impairment: Orientation, Attention, Memory, Problem solving                 Orientation Level: Time, Disoriented to (knew month, year took multiple tries to get correct) Current Attention Level: Sustained Memory:  (did not recognize her sister)       Problem Solving: Slow processing, Decreased initiation, Requires verbal cues, Requires tactile cues          General Comments General comments (skin integrity, edema, etc.): Sister present. Requested I call Rodney Booze, pt's daughter to get baseline functional status.    Exercises     Assessment/Plan    PT Assessment Patient needs continued PT services  PT Problem List Decreased strength;Decreased activity tolerance;Decreased balance;Decreased mobility;Decreased knowledge of use of DME;Decreased cognition;Cardiopulmonary status limiting activity       PT Treatment Interventions DME instruction;Gait training;Functional mobility training;Therapeutic activities;Therapeutic exercise;Patient/family education;Balance training;Cognitive remediation    PT Goals (Current goals can be found in the Care Plan section)  Acute Rehab PT Goals Patient Stated Goal: to go home PT Goal Formulation: With patient/family Time For Goal Achievement: 10/02/23 Potential to Achieve Goals: Good    Frequency  Min 1X/week     Co-evaluation               AM-PAC PT "6 Clicks" Mobility  Outcome Measure Help needed turning from your back to your side while in a flat bed without using bedrails?: None Help needed moving from lying on your back to sitting on the side of a flat bed without using bedrails?: None Help needed moving to and from a bed to a chair (including a wheelchair)?: A Little Help needed standing up from a chair using your arms (e.g., wheelchair or bedside chair)?: A Little Help needed to walk in hospital room?: A Little Help needed climbing 3-5 steps with a railing? : A Lot 6 Click Score: 19    End of Session Equipment Utilized During Treatment: Oxygen Activity Tolerance: Patient tolerated treatment well Patient left: in bed;with call bell/phone within reach;with bed alarm set;with family/visitor present Nurse Communication: Mobility status PT Visit Diagnosis: Muscle weakness (generalized) (M62.81);Difficulty in walking, not elsewhere classified (R26.2)    Time: 4132-4401 PT Time Calculation (min) (ACUTE ONLY): 31 min   Charges:   PT Evaluation $PT Eval Low Complexity: 1 Low PT Treatments $Therapeutic Activity: 8-22 mins PT General Charges $$ ACUTE PT VISIT: 1 Visit          Jerolyn Center, PT Acute Rehabilitation Services  Office [phone]   Zena Amos 09/18/2023, 4:38 PM

## 2023-09-18 NOTE — Progress Notes (Cosign Needed Addendum)
    Durable Medical Equipment  (From admission, onward)           Start     Ordered   09/18/23 1457  For home use only DME lightweight manual wheelchair with seat cushion  Once       Comments: Patient suffers from Lung Cancer which impairs their ability to perform daily activities like bathing, dressing, and toileting in the home.  A cane or walker will not resolve  issue with performing activities of daily living. A wheelchair will allow patient to safely perform daily activities. Patient is not able to propel themselves in the home using a standard weight wheelchair due to endurance and general weakness. Patient can self propel in the lightweight wheelchair. Length of need 12 months . Accessories: elevating leg rests (ELRs), wheel locks, extensions and anti-tippers.   09/18/23 1501   09/18/23 1432  For home use only DME Hospital bed  Once       Comments: Patient requires the head of bed to be elevated more than 30 degrees most of the time d/t Lung Ca with mets to the Brain, COPD, on 2L O2  Question Answer Comment  Length of Need 12 Months   Patient has (list medical condition): Stage IV Non-small Cell Lung Cancer with mets to the Brain, Sarcoidosis.   The above medical condition requires: Patient requires the ability to reposition frequently   Head must be elevated greater than: 45 degrees   Bed type Semi-electric   Support Surface: Low Air loss Mattress      09/18/23 1501

## 2023-09-18 NOTE — Plan of Care (Signed)

## 2023-09-18 NOTE — Evaluation (Signed)
Clinical/Bedside Swallow Evaluation Patient Details  Name: [name] MRN: [id number] Date of Birth: Dec 30, 1960  Today's Date: 09/18/2023 Time: SLP Start Time (ACUTE ONLY): 9528 SLP Stop Time (ACUTE ONLY): 0950 SLP Time Calculation (min) (ACUTE ONLY): 16 min  Past Medical History:  Past Medical History:  Diagnosis Date   Acute on chronic respiratory failure (HCC) 10/09/2016   Allergic rhinitis    Breast cancer (HCC) 2016   right breast   COPD, mild (HCC) FOLLOWED BY DR [name]   Depression    GERD (gastroesophageal reflux disease)    HTN (hypertension)    Hypertension    Iron deficiency anemia    Long-term current use of steroids SYMBICORT INHALER   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    Overweight (BMI 25.0-29.9) 05/11/2016   Palpitations    Personal history of radiation therapy 2016   Sarcoidosis STABLE PER CXR JUNE 2013   Shortness of breath    Sickle cell trait (HCC)    Past Surgical History:  Past Surgical History:  Procedure Laterality Date   ADJUSTABLE SUTURE MANIPULATION  05/22/2012   Procedure: ADJUSTABLE SUTURE MANIPULATION;  Surgeon: Corinda Gubler, MD;  [facility];  Service: Ophthalmology;  Laterality: Right;   BREAST LUMPECTOMY Right 2016   BRONCHIAL BIOPSY  07/09/2023   Procedure: BRONCHIAL BIOPSIES;  Surgeon: Leslye Peer, MD;  Location: [facility] ENDOSCOPY;  Service: Pulmonary;;   BRONCHIAL BRUSHINGS  07/09/2023   Procedure: BRONCHIAL BRUSHINGS;  Surgeon: Leslye Peer, MD;  Location: [facility] ENDOSCOPY;  Service: Pulmonary;;   BRONCHIAL NEEDLE ASPIRATION BIOPSY  07/09/2023   Procedure: BRONCHIAL NEEDLE ASPIRATION BIOPSIES;  Surgeon: Leslye Peer, MD;  Location: [facility] ENDOSCOPY;  Service: Pulmonary;;   CESAREAN SECTION  1986   W/ BILATERAL TUBAL LIGATION   COLONOSCOPY WITH PROPOFOL N/A 02/21/2019   Procedure: COLONOSCOPY WITH PROPOFOL;  Surgeon: Hilarie Fredrickson, MD;  Location: [facility] ENDOSCOPY;  Service: Endoscopy;  Laterality: N/A;   EYE SURGERY     IR IMAGING  GUIDED PORT INSERTION  08/01/2023   MEDIAN RECTUS REPAIR  05/22/2012   Procedure: MEDIAN RECTUS REPAIR;  Surgeon: Corinda Gubler, MD;  Location: [facility];  Service: Ophthalmology;  Laterality: Bilateral;  INFERIOR RECTUS RESECTION WITH ADJUSTIBLE SUTURES RIGHT EYE    POLYPECTOMY  02/21/2019   Procedure: POLYPECTOMY;  Surgeon: Hilarie Fredrickson, MD;  Location: [facility] ENDOSCOPY;  Service: Endoscopy;;   RADIOACTIVE SEED GUIDED PARTIAL MASTECTOMY WITH AXILLARY SENTINEL LYMPH NODE BIOPSY Right 08/18/2015   Procedure: RADIOACTIVE SEED GUIDED PARTIAL MASTECTOMY WITH AXILLARY SENTINEL LYMPH NODE BIOPSY;  Surgeon: Almond Lint, MD;  Location: [facility];  Service: General;  Laterality: Right;   TOTAL ROBOTIC ASSISTED LAPAROSCOPIC HYSTERECTOMY  12-30-2010   SYMPTOMATIC UTERINE FIBROIDS   UPPER TEETH EXTRACTION'S  1992   VIDEO BRONCHOSCOPY WITH RADIAL ENDOBRONCHIAL ULTRASOUND  07/09/2023   Procedure: VIDEO BRONCHOSCOPY WITH RADIAL ENDOBRONCHIAL ULTRASOUND;  Surgeon: Leslye Peer, MD;  Location: [facility] ENDOSCOPY;  Service: Pulmonary;;   HPI:  EARLY ORD is a 62 yo female presenting to ED 11/4 with AMS, global aphasia, and R facial droop. EEG with evidence of  epileptogenicity arising from L hemisphere. Seen 08/28/23 by SLP who recommended Dys 3 diet with thin liquids and f/u to assess need for instrumental swallow study. 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 Oral motor exam significant for mild R sided weakness and apparent R lingual deviation. Observed pt with trials of thin liquids, purees, and limited solids with no overt s/s of aspiration. She did have R anterior spillage as trials progressed but  presents with good awareness. Discussed preference for solids to be presented in bite-sized pieces due to edentulism. Recommend diet of Dys 3 textures and thin liquids. Will continue to follow. SLP Visit Diagnosis: Dysphagia, unspecified (R13.10)    Aspiration Risk  Mild aspiration risk    Diet Recommendation Dysphagia 3 (Mech soft);Thin liquid    Liquid Administration via: Cup;Straw Medication Administration: Whole meds with puree Supervision: Patient able to self feed Compensations: Slow rate;Small sips/bites Postural Changes: Seated upright at 90 degrees    Other  Recommendations Oral Care Recommendations: Oral care BID    Recommendations for follow up therapy are one component of a multi-disciplinary discharge planning process, led by the attending physician.  Recommendations may be updated based on patient status, additional functional criteria and insurance authorization.  Follow up Recommendations Home health SLP      Assistance Recommended at Discharge    Functional Status Assessment Patient has had a recent decline in their functional status and demonstrates the ability to make significant improvements in function in a reasonable and predictable amount of time.  Frequency and Duration min 2x/week  1 week       Prognosis Prognosis for improved oropharyngeal function: Fair Barriers to Reach Goals: Severity of deficits      Swallow Study   General HPI: [name] is a 62 yo female presenting to ED 11/4 with AMS, global aphasia, and R facial droop. EEG with evidence of  epileptogenicity arising from L hemisphere. Seen 08/28/23 by SLP who recommended Dys 3 diet with thin liquids and f/u to assess need for instrumental swallow study. 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, solitary brain and T1 bone metastasis) Type of Study: Bedside Swallow Evaluation Previous  Swallow Assessment: see HPI Diet Prior to this Study: Clear liquid diet Temperature Spikes Noted: No Respiratory Status: Nasal cannula History of Recent Intubation: No Behavior/Cognition: Alert;Cooperative;Pleasant mood Oral Cavity Assessment: Within Functional Limits Oral Care Completed by SLP: No Oral Cavity - Dentition: Edentulous Vision: Functional for self-feeding Self-Feeding Abilities: Able to feed self Patient Positioning: Upright in bed Baseline Vocal Quality: Normal Volitional Cough: Weak Volitional Swallow: Able to elicit    Oral/Motor/Sensory Function Overall Oral Motor/Sensory Function: Mild impairment Facial ROM: Within Functional Limits Facial Symmetry: Abnormal symmetry right Facial Strength: Within Functional Limits Facial Sensation: Within Functional Limits Lingual ROM: Reduced right Lingual Symmetry: Abnormal symmetry right   Ice Chips Ice chips: Not tested   Thin Liquid Thin Liquid: Within functional limits Presentation: Cup;Self Fed    Nectar Thick Nectar Thick Liquid: Not tested   Honey Thick Honey Thick Liquid: Not tested   Puree Puree: Within functional limits Presentation: Spoon;Self Fed   Solid     Solid: Within functional limits Presentation: Self Fed      Gwynneth Aliment, M.A., CF-SLP Speech Language Pathology, Acute Rehabilitation Services  Secure Chat preferred [phone]  09/18/2023,10:25 AM

## 2023-09-18 NOTE — Progress Notes (Signed)
LTM EEG hooked up and running - no initial skin breakdown - push button tested - [facility] monitoring.  

## 2023-09-18 NOTE — Progress Notes (Signed)
PT Cancellation Note  Patient Details Name: [name] MRN: [id number] DOB: Dec 28, 1960   Cancelled Treatment:    Reason Eval/Treat Not Completed: Patient at procedure or test/unavailable  Patient with EEG tech. Currently removing EEG.    Jerolyn Center, PT Acute Rehabilitation Services  Office [phone]  Zena Amos 09/18/2023, 3:36 PM

## 2023-09-18 NOTE — Progress Notes (Signed)
LTM D/C. No skin break down noted. [facility] notified.

## 2023-09-19 ENCOUNTER — Ambulatory Visit: Payer: 59

## 2023-09-19 ENCOUNTER — Other Ambulatory Visit: Payer: Self-pay | Admitting: Radiation Therapy

## 2023-09-19 ENCOUNTER — Other Ambulatory Visit: Payer: 59

## 2023-09-19 ENCOUNTER — Inpatient Hospital Stay (HOSPITAL_COMMUNITY): Payer: 59

## 2023-09-19 DIAGNOSIS — R410 Disorientation, unspecified: Secondary | ICD-10-CM | POA: Diagnosis not present

## 2023-09-19 DIAGNOSIS — Z515 Encounter for palliative care: Secondary | ICD-10-CM | POA: Diagnosis not present

## 2023-09-19 DIAGNOSIS — Z7189 Other specified counseling: Secondary | ICD-10-CM | POA: Diagnosis not present

## 2023-09-19 LAB — BASIC METABOLIC PANEL
Anion gap: 11 (ref 5–15)
BUN: 20 mg/dL (ref 8–23)
CO2: 35 mmol/L — ABNORMAL HIGH (ref 22–32)
Calcium: 7.9 mg/dL — ABNORMAL LOW (ref 8.9–10.3)
Chloride: 95 mmol/L — ABNORMAL LOW (ref 98–111)
Creatinine, Ser: 1.06 mg/dL — ABNORMAL HIGH (ref 0.44–1.00)
GFR, Estimated: 59 mL/min — ABNORMAL LOW (ref >60)
Glucose, Bld: 139 mg/dL — ABNORMAL HIGH (ref 70–99)
Potassium: 3.3 mmol/L — ABNORMAL LOW (ref 3.5–5.1)
Sodium: 141 mmol/L (ref 135–145)

## 2023-09-19 LAB — AMMONIA: Ammonia: 16 umol/L (ref 9–35)

## 2023-09-19 MED ORDER — ATORVASTATIN CALCIUM 10 MG PO TABS
20.0000 mg | ORAL_TABLET | Freq: Every day | ORAL | Status: DC
  Administered 2023-09-19 – 2023-09-21 (×3): 20 mg via ORAL
  Filled 2023-09-19 (×3): qty 2

## 2023-09-19 MED ORDER — POTASSIUM CHLORIDE CRYS ER 20 MEQ PO TBCR
40.0000 meq | EXTENDED_RELEASE_TABLET | Freq: Once | ORAL | Status: AC
Start: 2023-09-19 — End: 2023-09-19
  Administered 2023-09-19: 40 meq via ORAL
  Filled 2023-09-19: qty 2

## 2023-09-19 MED ORDER — DEXAMETHASONE 4 MG PO TABS
4.0000 mg | ORAL_TABLET | Freq: Four times a day (QID) | ORAL | Status: DC
  Administered 2023-09-19 – 2023-09-21 (×8): 4 mg via ORAL
  Filled 2023-09-19 (×8): qty 1

## 2023-09-19 MED ORDER — ASPIRIN 81 MG PO CHEW
81.0000 mg | CHEWABLE_TABLET | Freq: Every day | ORAL | Status: DC
  Administered 2023-09-20 – 2023-09-21 (×2): 81 mg via ORAL
  Filled 2023-09-19 (×3): qty 1

## 2023-09-19 MED ORDER — PANTOPRAZOLE SODIUM 40 MG PO TBEC
40.0000 mg | DELAYED_RELEASE_TABLET | Freq: Every day | ORAL | Status: DC
  Administered 2023-09-19 – 2023-09-21 (×3): 40 mg via ORAL
  Filled 2023-09-19 (×3): qty 1

## 2023-09-19 MED ORDER — SERTRALINE HCL 50 MG PO TABS
50.0000 mg | ORAL_TABLET | Freq: Every day | ORAL | Status: DC
  Administered 2023-09-19 – 2023-09-21 (×3): 50 mg via ORAL
  Filled 2023-09-19 (×3): qty 1

## 2023-09-19 MED ORDER — LEVETIRACETAM 500 MG PO TABS
750.0000 mg | ORAL_TABLET | Freq: Two times a day (BID) | ORAL | Status: DC
  Administered 2023-09-19 – 2023-09-21 (×5): 750 mg via ORAL
  Filled 2023-09-19 (×5): qty 1

## 2023-09-19 MED ORDER — IOHEXOL 350 MG/ML SOLN
50.0000 mL | Freq: Once | INTRAVENOUS | Status: AC | PRN
  Administered 2023-09-19: 50 mL via INTRAVENOUS

## 2023-09-19 NOTE — Progress Notes (Signed)
   Palliative Medicine Inpatient Follow Up Note HPI: [name] is a 62 y.o. 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  The Palliative care team has been asked to get involved to further assist with goals of care conversations.   Today's Discussion 09/19/2023  *Please note that this is a verbal dictation therefore any spelling or grammatical errors are due to the "[employer]agon [employer] One" system interpretation.  Chart reviewed inclusive of vital signs, progress notes, laboratory results, and diagnostic images.   I met with [name] at bedside this morning. She is noted to be working with the PT team. They had halted their exercise as patient had an increased heart rate. I shared that I would come back later in the afternoon.  I called patients daughter, [name] this morning. She shares that she has not spoken to [place] about whether or not she would desire continued chemotherapy or not. We agreed to meet tomorrow at [facility] for further conversations.   I met this afternoon with [name] in the presence of her niece. Created space and opportunity for patient to explore thoughts feelings and fears regarding current medical situation. We reviewed the plan to meet tomorrow to further discuss if patient would like to continue treatments for her cancer moving forward. We discussed the advantages and disadvantages. We reviewed the importance of better asserting patient wishes.  [name] denies symptoms of pain, nausea, or shortness of breath this afternoon.  Plan to meet with patient and her daughter tomorrow afternoon to further delineate goals.   Questions and  concerns addressed/Palliative Support Provided.   Objective Assessment: Vital Signs Vitals:   09/19/23 0434 09/19/23 0909  BP: 133/84 135/79  Pulse: 100 (!) 107  Resp: (!) 24 20  Temp: 98.4 F (36.9 C) 97.9 F (36.6 C)  SpO2: 100% 100%   No intake or output data in the 24 hours ending 09/19/23 1410 Last Weight  Most recent update: 09/17/2023 11:42 AM    Weight  74.4 kg (164 lb)            Gen: Elderly African-American female in no acute distress HEENT: moist mucous membranes CV: Irregular rate and rhythm PULM: On 2 L nasal cannula breathing is even and nonlabored ABD: soft/nontender EXT: No edema Neuro: Alert and oriented x2  SUMMARY OF RECOMMENDATIONS   DNAR/DNI   Plan for family meeting at [facility] tomorrow to further delineate if patient would desire additional chemo therapy or not. If patient denies additional treatments will discuss hospice in greater detail   Ongoing palliative care support  Billing based on MDM: High ______________________________________________________________________________________ Lamarr Lulas [facility] Palliative Medicine Team Team Cell Phone: [phone] Please utilize secure chat with additional questions, if there is no response within 30 minutes please call the above phone number  Palliative Medicine Team providers are available by phone from 7am to 7pm daily and can be reached through the team cell phone.  Should this patient require assistance outside of these hours, please call the patient's attending physician.

## 2023-09-19 NOTE — Evaluation (Signed)
Occupational Therapy Evaluation Patient Details Name: [name] MRN: [id number] DOB: [birth year] Today's Date: 09/19/2023   History of Present Illness 62 yo female presenting to ED 11/4 with AMS, global aphasia, and R facial droop. EEG with evidence of  epileptogenicity arising from L hemisphere.  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 Pt needing Max to setup assist for ADLs and CGA for transfers and STS, unable to take steps. HR elevate with activity, recommend ehr vision be further assessed, pt tracking across room. She is generally weak but AROM of BUEs WFL. Pt would benefit from continued acute skilled OT services to address deficits and help transition to next level of care. Patient would benefit from post acute Home OT services to help maximize functional independence in natural environment with pt progressing.          If plan is discharge home, recommend the following: A little help with walking and/or transfers;A little help with bathing/dressing/bathroom;Assistance with cooking/housework;Supervision due to cognitive status    Functional Status Assessment  Patient has had a recent decline in their functional status and demonstrates the ability to make significant improvements in function in a reasonable and predictable amount of time.  Equipment Recommendations  Wheelchair (measurements OT);Hospital bed;BSC/3in1    Recommendations for Other Services       Precautions / Restrictions Precautions Precautions: Fall Precaution Comments: O2 dependent at baseline Restrictions Weight Bearing Restrictions: No      Mobility Bed Mobility Overal bed mobility: Modified Independent             General bed  mobility comments: incr time and effort    Transfers Overall transfer level: Needs assistance Equipment used: None Transfers: Sit to/from Stand, Bed to chair/wheelchair/BSC Sit to Stand: Contact guard assist     Step pivot transfers: Contact guard assist     General transfer comment: not able to ambulate, HR elevated. Cues for hand placement, STSx2 from EOB. Sat back down abruptly after first STS      Balance Overall balance assessment: Needs assistance Sitting-balance support: No upper extremity supported, Feet supported Sitting balance-Leahy Scale: Good     Standing balance support: No upper extremity supported, During functional activity Standing balance-Leahy Scale: Fair Standing balance comment: static standing with HHA for wiping                           ADL either performed or assessed with clinical judgement   ADL Overall ADL's : Needs assistance/impaired Eating/Feeding: Set up;Bed level   Grooming: Wash/dry face;Sitting;Set up;Supervision/safety   Upper Body Bathing: Sitting;Supervision/ safety;Set up Upper Body Bathing Details (indicate cue type and reason): sitting EOB Lower Body Bathing: Sitting/lateral leans;Contact guard assist   Upper Body Dressing : Sitting;Minimal assistance Upper Body Dressing Details (indicate cue type and reason): doff/don gown Lower Body Dressing: Sitting/lateral leans;Minimal assistance   Toilet Transfer: BSC/3in1;Contact guard assist;Stand-pivot Statistician Details (indicate cue type and reason): HHA Toileting- Clothing Manipulation and Hygiene: Maximal assistance;Sit to/from stand Toileting - Clothing Manipulation Details (indicate cue type and reason): for rear pericare       General ADL Comments: attempted to progress pt with gait, limited by HR     Vision   Additional Comments:  Needs to be further assessed, tracking staff/family across room. No inattention noted     Perception         Praxis          Pertinent Vitals/Pain Pain Assessment Pain Assessment: No/denies pain     Extremity/Trunk Assessment Upper Extremity Assessment Upper Extremity Assessment: Generalized weakness   Lower Extremity Assessment Lower Extremity Assessment: Generalized weakness (symmetric R vs L; grossly 4/5)   Cervical / Trunk Assessment Cervical / Trunk Assessment: Normal   Communication Communication Communication: Difficulty communicating thoughts/reduced clarity of speech (dysarthric) Cueing Techniques: Verbal cues;Tactile cues   Cognition Arousal: Alert Behavior During Therapy: Flat affect Overall Cognitive Status: Impaired/Different from baseline                               Problem Solving: Slow processing, Requires verbal cues, Requires tactile cues       General Comments  Family present during session, reporting symptoms that are still off from her baseline such as the drooling and slurry speech    Exercises     Shoulder Instructions      Home Living Family/patient expects to be discharged to:: Private residence Living Arrangements: Children Available Help at Discharge: Family Type of Home: Apartment Home Access: Stairs to enter Secretary/administrator of Steps: 7 Entrance Stairs-Rails: Can reach both Home Layout: One level     Bathroom Shower/Tub: Chief Strategy Officer: Standard Bathroom Accessibility: Yes   Home Equipment: Rollator (4 wheels);Shower seat   Additional Comments: on 2L at home. Family reports making arrangements for 24/7 support      Prior Functioning/Environment Prior Level of Function : Needs assist             Mobility Comments: reports using rollator recently; stairs have become difficult, with a wheelchair there is a way they could go around through the grass and avoid the stairs in/out of apartment ADLs Comments: modified independent showering (seat) and ind dressing; still cooking        OT Problem List:  Impaired balance (sitting and/or standing);Decreased strength;Cardiopulmonary status limiting activity      OT Treatment/Interventions: Self-care/ADL training;Balance training;Therapeutic exercise;Therapeutic activities;Patient/family education    OT Goals(Current goals can be found in the care plan section) Acute Rehab OT Goals Patient Stated Goal: To get better; go home OT Goal Formulation: With patient/family Time For Goal Achievement: 10/03/23 Potential to Achieve Goals: Good ADL Goals Pt Will Perform Grooming: with supervision;standing Pt Will Perform Lower Body Bathing: with set-up;with supervision;sitting/lateral leans Pt Will Perform Upper Body Dressing: with supervision;sitting Pt Will Perform Lower Body Dressing: sit to/from stand;with supervision Pt Will Transfer to Toilet: with supervision;ambulating  OT Frequency: Min 1X/week    Co-evaluation              AM-PAC OT "6 Clicks" Daily Activity     Outcome Measure Help from another person eating meals?: A Little Help from another person taking care of personal grooming?: A Little Help from another person toileting, which includes using toliet, bedpan, or urinal?: A Lot Help from another person bathing (including washing, rinsing, drying)?: A Little Help from another person to put on and taking off regular upper body clothing?: A Little Help from another person to put on and taking off regular lower body clothing?: A Little 6 Click Score: 17   End of Session Equipment Utilized During Treatment: Gait belt;Oxygen (2L) Nurse Communication: Mobility status  Activity Tolerance: Patient  tolerated treatment well Patient left: in bed;with call bell/phone within reach;with family/visitor present;Other (comment) (transport in room to take to CT)  OT Visit Diagnosis: Unsteadiness on feet (R26.81);Cognitive communication deficit (R41.841);Muscle weakness (generalized) (M62.81) Symptoms and signs involving cognitive functions:   (not indicated at this time)                Time: 1610-9604 OT Time Calculation (min): 25 min Charges:  OT General Charges $OT Visit: 1 Visit OT Evaluation $OT Eval Moderate Complexity: 1 Mod OT Treatments $Self Care/Home Management : 8-22 mins  09/19/2023  AB, OTR/L  Acute Rehabilitation Services  Office: [phone]   Tristan Schroeder 09/19/2023, 10:28 AM

## 2023-09-19 NOTE — Progress Notes (Signed)
Speech Language Pathology Treatment: Dysphagia  Patient Details Name: [name] MRN: [id number] DOB: [birth year] Today's Date: 09/19/2023 Time: 2130-8657 SLP Time Calculation (min) (ACUTE ONLY): 12 min  Assessment / Plan / Recommendation Clinical Impression  Pt increasingly alert and participatory this date. Trials of thin liquids WFL. She demonstrated prolonged mastication and impulsivity with solids, attempting subsequent bites prior to swallowing. Pt responded to Min cueing to use a slow rate provided by SLP and family member. They do report that R facial weakness and resultant anterior spillage is an acute change. Recommend continuing current diet of Dys 3 textures and thin liquids and f/u with SLP on an OP basis for orofacial strengthening exercises PRN. SLP to s/o acutely.    HPI HPI: [name] is a 62 yo female presenting to ED 11/4 with AMS, global aphasia, and R facial droop. EEG with evidence of  epileptogenicity arising from L hemisphere. Seen 08/28/23 by SLP who recommended Dys 3 diet with thin liquids and f/u to assess need for instrumental swallow study. 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  Recommendations may be updated based on patient status, additional functional criteria and insurance authorization.    Recommendations  Diet recommendations: Dysphagia 3 (mechanical soft);Thin liquid Liquids provided via: Straw;Cup Medication Administration: Whole meds with puree Supervision: Patient able to self feed Compensations: Slow rate;Small sips/bites;Monitor for anterior loss Postural Changes and/or Swallow Maneuvers: Seated upright 90 degrees                   Oral care BID   PRN Dysphagia, unspecified (R13.10)     All goals met     Gwynneth Aliment, M.A., CF-SLP Speech Language Pathology, Acute Rehabilitation Services  Secure Chat preferred [phone]   09/19/2023, 1:10 PM

## 2023-09-19 NOTE — Progress Notes (Signed)
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  She was supposed to have palliative chemo/radiation therapy initiated  on 11/4.  Patient slumped over the bed, became unresponsive, there was concern for left-sided weakness.  At baseline, she uses walker for ambulation.  Found to have new mets in the brain as per MRI on 08/29/2023 and was on oral Decadron.  On presentation, she was hemodynamically stable.  She was noted to have rhythmic movement of her lower jaw, she was confused on presentation, so it was  concerning for seizure.  CT head/MRI brain showed multiple brain mets with surrounding edema.  Started on IV Decadron.  Neurology consulted.    Radiation oncology, palliative care also following.neurology/radiation oncology signed off saying outpatient follow-up.  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  MRI showed unchanged size of contrast-enhancing masses of the left temporal lobe and left occipital lobe with  progression of surrounding vasogenic edema. .  Mildly elevated ammonia level.  Mental status remains improved. she is mostly oriented, on dysphagia 3 diet.    Metastatic lung cancer to brain: Imagings  above.  Finding of worsening edema.  Started on IV Decadron.  She was following with oncology/radiation oncology.  Was planned to start on palliative chemoradiation. Radiation oncology consulted.  Oncology, Dr. Arbutus Ped recommends outpatient follow-up.  CT chest follow-up done today showed decrease size of right upper lobe lung mass.  Radiation oncology and oncology recommends outpatient follow-up for discharge.  As per radiation oncology, will keep her on Decadron 4 mg, 4 times a day  Seizure: EEG obtained.  EEG obtained which showed evidence of etiopathogenicity arising from left hemisphere.  Neurology recommended to continue 750 mg Keppra twice daily, no further workup recommended  Chronic respiratory failure/COPD/sarcoidosis: On 2 L of oxygen at home.  Continue bronchodilators.  Currently not in exacerbation  Leukocytosis/elevated lactate/suspected postoobstructive pneumonia:UA did not show any evidence of UTI, chest x-ray showed right upper lobe mass with cavitation, right lower lobe atelectasis.  Pneumonia not excluded.  Started on antibiotic for possible post obstructive pneumonia, on ceftriaxone. lactate sepsis resolved, leukocytosis improved.  Can change antibiotics to oral on discharge.  Plan for total course of 5 days  Liver cirrhosis: Does appear to be compensated.  Has mild elevated ammonia, started on low-dose lactulose  Hypokalemia: Supplemented   GERD: Continue PPI  Depression/Anxiety: On zoloft  Generalized weakness: PT/OT consulted, recommended home health  Goals of care: History of metastatic lung cancer to brain.  Very poor prognosis.  Palliative care consulted for goals of care and following.  Family open for goals of care discussion.  Family meeting with palliative care at 2 pm  tomorrow         DVT prophylaxis:enoxaparin (LOVENOX) injection 40 mg Start: 09/17/23 1800     Code Status: Limited: Do not attempt resuscitation (DNR) -DNR-LIMITED -Do Not Intubate/DNI   Family Communication:  Called and discussed daughter [name] on phone on 11/6  Patient status:Inpatient  Patient is from :home  Anticipated discharge MW:UXLK  Estimated DC date:1-2 days, awaiting goals of care discussion     Consultants: Neurology, palliative care, adiation oncology  Procedures: EEG  Antimicrobials:  Anti-infectives (From admission, onward)    Start     Dose/Rate Route Frequency Ordered Stop   09/18/23 1815  cefTRIAXone (ROCEPHIN) 2 g in sodium chloride 0.9 % 100 mL IVPB        2 g 200 mL/hr over 30 Minutes Intravenous Every 24 hours 09/18/23 1111 09/22/23 1814   09/17/23 1815  cefTRIAXone (ROCEPHIN) 1 g in sodium chloride 0.9 % 100 mL IVPB  Status:  Discontinued        1 g 200 mL/hr over 30 Minutes Intravenous Every 24 hours 09/17/23 1810 09/18/23 1111       Subjective: Patient seen and examined the bedside today.  Hemodynamically stable.  She was being cleaned.  Appears weak but overall comfortable, not in distress.  Alert and awake and mostly oriented.  Objective: Vitals:   09/18/23 1645 09/18/23 2000 09/19/23 0434 09/19/23 0909  BP: 129/84 122/78 133/84 135/79  Pulse: (!) 106 100 100 (!) 107  Resp: (!) 21  (!) 24 20  Temp: 98.3 F (36.8 C) 98.7 F (37.1 C) 98.4 F (36.9 C) 97.9 F (36.6 C)  TempSrc: Oral Oral Oral   SpO2: 100% 100% 100% 100%  Weight:      Height:        Intake/Output Summary (Last 24 hours) at 09/19/2023 1230 Last data filed at 09/18/2023 1300 Gross per 24 hour  Intake 100 ml  Output --  Net 100 ml   Filed Weights   09/17/23 1142  Weight: 74.4 kg    Examination:  General exam: Overall comfortable, not in distress, weak and deconditioned HEENT: PERRL Respiratory system:  no wheezes or crackles, diminished sounds on  bases Cardiovascular system: S1 & S2 heard, RRR.  Gastrointestinal system: Abdomen is nondistended, soft and nontender. Central nervous system: Alert and oriented, generalized weakness Extremities: No edema, no clubbing ,no cyanosis Skin: No rashes, no ulcers,no icterus      Data Reviewed: I have personally reviewed following labs and imaging studies  CBC: Recent Labs  Lab 09/17/23 1139 09/17/23 1144 09/17/23 1146 09/17/23 2006  WBC 15.7*  --   --  10.7*  NEUTROABS 13.5*  --   --   --   HGB 11.3* 12.2 12.6 11.5*  HCT 36.7 36.0 37.0 36.5  MCV 85.0  --   --  83.1  PLT 192  --   --  162   Basic Metabolic Panel: Recent Labs  Lab 09/17/23 1139 09/17/23 1144 09/17/23 1146 09/17/23 2006 09/18/23 0437 09/19/23 0419  NA 146* 141 141  --  143 141  K 3.2* 3.0* 3.1*  --  3.6 3.3*  CL 93* 90*  --   --  96* 95*  CO2 35*  --   --   --  33* 35*  GLUCOSE 145* 147*  --   --  128* 139*  BUN 10 11  --   --  17 20  CREATININE 1.04* 0.90  --  1.00 1.16* 1.06*  CALCIUM 8.7*  --   --   --  8.0* 7.9*     Recent Results (from the past 240 hour(s))  Blood Culture (Routine X 2)     Status: None (Preliminary result)   Collection Time: 09/17/23 12:00 PM   Specimen: BLOOD  Result Value Ref Range Status   Specimen Description BLOOD RIGHT ANTECUBITAL  Final   Special Requests   Final    BOTTLES DRAWN AEROBIC AND ANAEROBIC Blood Culture results may not be optimal due to an excessive volume of blood received in culture bottles   Culture   Final    NO GROWTH 2 DAYS Performed at [facility], [address], [place], [place] [address]    Report Status PENDING  Incomplete  Blood Culture (Routine X 2)     Status: None (Preliminary result)   Collection Time: 09/17/23 12:03 PM   Specimen: BLOOD RIGHT HAND  Result Value Ref Range Status   Specimen Description BLOOD RIGHT HAND  Final   Special Requests   Final    BOTTLES DRAWN AEROBIC AND ANAEROBIC Blood Culture results may not be optimal  due to an excessive volume of blood received in culture bottles   Culture   Final    NO GROWTH 2 DAYS Performed at [facility], [address], [place], [place] [address]    Report Status PENDING  Incomplete     Radiology Studies: CT CHEST W CONTRAST  Result Date: 09/19/2023 CLINICAL DATA:  Non-small cell lung cancer staging; * Tracking Code: BO * EXAM: CT CHEST WITH CONTRAST TECHNIQUE: Multidetector CT imaging of the chest was performed during intravenous contrast administration. RADIATION DOSE REDUCTION: This exam was performed according to the departmental dose-optimization program which includes automated exposure control, adjustment of the mA and/or kV according to patient size and/or use of iterative reconstruction technique. CONTRAST:  50mL OMNIPAQUE IOHEXOL 350 MG/ML SOLN COMPARISON:  PET-CT dated August 11, 2023; chest CT dated June 26, 2023 FINDINGS: Cardiovascular: Normal heart size. No pericardial effusion. Normal caliber thoracic aorta with moderate atherosclerotic disease. Mediastinum/Nodes: Esophagus and thyroid are unremarkable. No enlarged lymph nodes seen in the chest. Lungs/Pleura: Right upper lobe mass is decreased in size with new areas of cavitation measuring 4.8 x 3.6 cm, previously 6.3 x 5.1 cm. Stable irregular linear opacities of the bilateral lower lobes, likely due to scarring. New small right pleural effusion. Severe emphysema. Upper Abdomen: Stable cystic lesion of the anterior right kidney. No acute abnormality. Musculoskeletal: No chest wall abnormality. Unchanged diffuse sclerosis of the T3 vertebral body. IMPRESSION: 1. Right upper lobe mass is decreased in size with new areas of cavitation. 2. New small right pleural effusion. 3. Unchanged diffuse sclerosis of the T3 vertebral body. No evidence of progressive metastatic disease. 4. Aortic Atherosclerosis (ICD10-I70.0) and Emphysema (ICD10-J43.9). Electronically Signed   By: Allegra Lai M.D.   On:  09/19/2023 11:11   Overnight EEG with video  Result Date: 09/18/2023 Charlsie Quest, MD     09/18/2023  8:37 AM Patient Name: ROXIE KREEGER MRN: [id number] Epilepsy Attending: Charlsie Quest Referring Physician/Provider: Marjorie Smolder, NP Duration: 09/18/2023 0020 to 0830  Patient history:  62 y.o. female with history of stage IV non-small cell lung cancer with mets to the brain status post stereotactic radiosurgery on 10/28, breast cancer, GERD, depression, COPD, hypertension, anemia, sarcoidosis and sickle cell trait who presents  with altered mental status and decreased responsiveness as of this morning. EEG to evaluate for seizure  Level of alertness:  comatose/ lethargic-->awake  AEDs during EEG study: LEV  Technical aspects: This EEG study was done with scalp electrodes positioned according to the 10-20 International system of electrode placement. Electrical activity was reviewed with band pass filter of 1-70Hz , sensitivity of 7 uV/mm, display speed of 60mm/sec with a 60Hz  notched filter applied as appropriate. EEG data were recorded continuously and digitally stored.  Video monitoring was available and reviewed as appropriate.  Description: EEG initially showed lateralized periodic discharges ( LPDs) with overriding fast activity were noted in left hemisphere with fluctuating frequency of 0.5 to 2 Hz, at times with overlying rhythmicity, without definite evolution. EEG also showed continuous generalized 3 to 6 Hz theta- delta slowing admixed with 12 to 15 Hz with activity. Gradually, the morphology of LPDs improved and there was no overriding fast activity.  Additionally the frequency of LPDs improved to 0.25-0.5Hz .  EEG also showed posterior dominant rhythm of 8 Hz activity of moderate voltage (25-35 uV) seen predominantly in posterior head regions, symmetric and reactive to eye opening and eye closing.  Intermittent generalized and lateralized left hemisphere 3-6 Esti Deppen delta slowing was  also noted.  Hyperventilation and photic stimulation were not performed.    ABNORMALITY - Lateralized periodic discharges ( LPD), left hemisphere - Continuous slow, generalized and lateralized left hemisphere  IMPRESSION: This study initially showed evidence of epileptogenicity arising from left hemisphere with increased risk of seizure recurrence.  Additionally there was moderate diffuse encephalopathy.  Gradually, EEG improved and was suggestive of cortical dysfunction in left hemisphere likely secondary to underlying structural abnormality as well as mild diffuse encephalopathy.  No seizures were seen during the study.  Charlsie Quest   EEG adult  Result Date: 09/18/2023 Charlsie Quest, MD     09/18/2023  8:33 AM Patient Name: SHAMARI TROSTEL MRN: [id number] Epilepsy Attending: Charlsie Quest Referring Physician/Provider: Alessandra Bevels, MD Date: 09/17/2023 Duration: 26.50 mins Patient history:  62 y.o. 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 EEG to evaluate for seizure Level of alertness:  comatose/ lethargic AEDs during EEG study: LEV Technical aspects: This EEG study was done with scalp electrodes positioned according to the 10-20 International system of electrode placement. Electrical activity was reviewed with band pass filter of 1-70Hz , sensitivity of 7 uV/mm, display speed of 68mm/sec with a 60Hz  notched filter applied as appropriate. EEG data were recorded continuously and digitally stored.  Video monitoring was available and reviewed as appropriate. Description: EEG showed continuous generalized 3 to 6 Hz theta- delta slowing admixed with 12 to 15 Hz with activity.  Lateralized periodic discharges with overriding fast  activity were noted in left hemisphere with fluctuating frequency of 0.5 to 2 Hz, at times with overlying rhythmicity, without definite evolution. Hyperventilation and photic stimulation were not performed.   ABNORMALITY -Lateralized periodic discharges with overriding fast activity ( LPD+F), left hemisphere - Continuous slow, generalized IMPRESSION: This study showed evidence of epileptogenicity arising from left hemisphere.  This EEG pattern is on the ictal-interictal continuum with increased risk for seizures.  Additionally there is moderate to severe diffuse encephalopathy.  No definite seizures were seen during the study. Priyanka O  Melynda Ripple   MR BRAIN W WO CONTRAST  Result Date: 09/17/2023 CLINICAL DATA:  Altered mental status and seizure. Brain metastases. EXAM: MRI HEAD WITHOUT AND WITH CONTRAST TECHNIQUE: Multiplanar, multiecho pulse sequences of the brain and surrounding structures were obtained without and with intravenous contrast. CONTRAST:  7mL GADAVIST GADOBUTROL 1 MMOL/ML IV SOLN COMPARISON:  08/29/2023 FINDINGS: Brain: There is abnormal diffusion restriction within the medial left PCA territory, including the dorsomedial left thalamus. There is also diffusion restriction of the left insula. There is vasogenic edema within the anterior left temporal lobe and left occipital lobe, near the regions of diffusion restriction. There contrast-enhancing masses at both locations. The left temporal mass measures 10 mm, unchanged (series 19, image 27). Left occipital mass measures 10 mm, unchanged (image 23). Lesion of the right postcentral gyrus measures 7 mm, previously 9 mm (image 48). There is a focus of likely extra-axial contrast enhancement at the left cerebellopontine angle that measures 7 mm, previously 3 mm (series 19, image 18). The midline structures are normal. Vascular: Normal flow voids. Skull and upper cervical spine: Normal calvarium and skull base. Visualized upper cervical spine and soft  tissues are normal. Sinuses/Orbits:No paranasal sinus fluid levels or advanced mucosal thickening. No mastoid or middle ear effusion. Normal orbits. IMPRESSION: 1. Unchanged size of contrast-enhancing masses of the left temporal lobe and left occipital lobe with progression of surrounding vasogenic edema. 2. Areas of diffusion restriction of the left insula and left PCA territory, near the above described lesions, may be a postictal phenomenon. 3. Slight decrease in size of the right postcentral gyrus metastasis. 4. Increased size of a 7 mm extra-axial contrast-enhancing mass at the left cerebellopontine angle, which could be a meningioma or extra-axial metastasis. Electronically Signed   By: Deatra Robinson M.D.   On: 09/17/2023 19:51   DG Chest Port 1 View  Result Date: 09/17/2023 CLINICAL DATA:  Altered mental status.  Lung cancer. EXAM: PORTABLE CHEST 1 VIEW COMPARISON:  08/27/2023. FINDINGS: There is a right chest wall port a catheter with tip in the distal SVC. Stable cardiomediastinal contours. The right upper lobe treated lung mass is again noted with signs of central cavitary change. There is asymmetric right lung volume loss with increase opacification in the right lower lung compared with the previous exam. Left lung appears clear. IMPRESSION: 1. Increased opacification in the right lower lung compared with the previous exam. Atelectasis and or pleural effusion. Underlying pneumonia would be difficult to exclude. 2. Stable appearance of right upper lobe lung mass with central cavitation. Electronically Signed   By: Signa Kell M.D.   On: 09/17/2023 15:41   CT HEAD WO CONTRAST  Result Date: 09/17/2023 CLINICAL DATA:  Mental status change, unknown cause EXAM: CT HEAD WITHOUT CONTRAST TECHNIQUE: Contiguous axial images were obtained from the base of the skull through the vertex without intravenous contrast. RADIATION DOSE REDUCTION: This exam was performed according to the departmental  dose-optimization program which includes automated exposure control, adjustment of the mA and/or kV according to patient size and/or use of iterative reconstruction technique. COMPARISON:  Brain MR 08/29/23 FINDINGS: Brain: No hemorrhage. No hydrocephalus. No extra-axial fluid collection. No CT evidence of an acute cortical infarct. 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 There is no definite evidence of new metastatic disease on this noncontrast enhanced exam. Vascular: No hyperdense vessel or unexpected calcification. Skull: Normal. Negative for fracture or focal lesion.  Sinuses/Orbits: No acute finding. Other: None. IMPRESSION: 1. No CT evidence of an acute cortical infarct or intracranial hemorrhage. 2. Redemonstrated vasogenic edema at sites of known metastatic disease in the anterior left temporal lobe and in the left occipital lobe. There is no definite evidence of new metastatic disease on this noncontrast enhanced exam. If more detailed characterization for new metastatic disease is clinically warranted, further evaluation with repeat brain MRI recommended. Electronically Signed   By: Lorenza Cambridge M.D.   On: 09/17/2023 14:59    Scheduled Meds:  [START ON 09/20/2023] aspirin  81 mg Oral Daily   dexamethasone (DECADRON) injection  4 mg Intravenous Q6H   enoxaparin (LOVENOX) injection  40 mg Subcutaneous Q24H   lactulose  10 g Oral BID   levETIRAcetam  750 mg Oral BID   pantoprazole (PROTONIX) IV  40 mg Intravenous Q24H   Continuous Infusions:  cefTRIAXone (ROCEPHIN)  IV 2 g (09/18/23 1652)     LOS: 2 days   Burnadette Pop, MD [facility] P11/04/2023, 12:30 PM

## 2023-09-19 NOTE — Plan of Care (Signed)

## 2023-09-19 NOTE — Progress Notes (Signed)
Physical Therapy Treatment Patient Details Name: [name] MRN: [id number] DOB: [birth year] Today's Date: 09/19/2023   History of Present Illness 62 yo female presenting to ED 11/4 with AMS, global aphasia, and R facial droop. EEG with evidence of  epileptogenicity arising from L hemisphere.  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, solitary brain and T1 bone metastasis)    PT Comments  Continuing work on functional mobility and activity tolerance;  Session focused on activity tolerance, and progressive amb to help further discern dc plan; Pt walked with Rollator well, used 2 L supplemental O2 during walk and O2 sats remained greater than or equal to 93%; Nieces in the room, and we discussed using a wheelchair for getting into pt's apartment building -- that was she can wheel around to a different entrance, and not need to worry about getting up and down up to 7 steps; Rec HHPT/OT follow up    If plan is discharge home, recommend the following: A little help with walking and/or transfers;Direct supervision/assist for medications management;Direct supervision/assist for financial management;Assist for transportation;Help with stairs or ramp for entrance;Supervision due to cognitive status   Can travel by private vehicle        Equipment Recommendations  Wheelchair (measurements PT);Wheelchair cushion (measurements PT);Hospital bed;BSC/3in1    Recommendations for Other Services       Precautions / Restrictions Precautions Precautions: Fall Precaution Comments: O2 dependent, 2 L at baseline Restrictions Weight Bearing Restrictions: No     Mobility  Bed Mobility Overal bed mobility: Modified Independent             General bed mobility comments: incr time and effort    Transfers Overall transfer level: Needs assistance Equipment used: Rollator (4 wheels) Transfers:  Sit to/from Stand Sit to Stand: Contact guard assist           General transfer comment: Smooth transition into standing    Ambulation/Gait Ambulation/Gait assistance: Contact guard assist Gait Distance (Feet): 75 Feet Assistive device: Rollator (4 wheels) Gait Pattern/deviations: Decreased stride length, Decreased step length - right, Decreased step length - left       General Gait Details: Walked on 2 L supplemental O2 and O2 sats stayed at or above 93%; slow pace; encouraged pt to self-monitor for activity tolerance and sit if needed; pt did not opt to sit   Stairs             Wheelchair Mobility     Tilt Bed    Modified Rankin (Stroke Patients Only)       Balance     Sitting balance-Leahy Scale: Good       Standing balance-Leahy Scale: Fair                              Cognition Arousal: Alert Behavior During Therapy: Flat affect Overall Cognitive Status: Impaired/Different from baseline                               Problem Solving: Slow processing, Requires verbal cues, Requires tactile cues          Exercises      General Comments General comments (skin integrity, edema, etc.): Pt's nieces, [name] and [name], present and helpful during session      Pertinent Vitals/Pain Pain Assessment Pain  Assessment: No/denies pain    Home Living Family/patient expects to be discharged to:: Private residence Living Arrangements: Children Available Help at Discharge: Family Type of Home: Apartment Home Access: Stairs to enter Entrance Stairs-Rails: Can reach both Entrance Stairs-Number of Steps: 7   Home Layout: One level Home Equipment: Rollator (4 wheels);Shower seat Additional Comments: on 2L at home. Family reports making arrangements for 24/7 support    Prior Function            PT Goals (current goals can now be found in the care plan section) Acute Rehab PT Goals Patient Stated Goal: to go home PT Goal  Formulation: With patient/family Time For Goal Achievement: 10/02/23 Potential to Achieve Goals: Good Progress towards PT goals: Progressing toward goals    Frequency    Min 1X/week      PT Plan      Co-evaluation              AM-PAC PT "6 Clicks" Mobility   Outcome Measure  Help needed turning from your back to your side while in a flat bed without using bedrails?: None Help needed moving from lying on your back to sitting on the side of a flat bed without using bedrails?: None Help needed moving to and from a bed to a chair (including a wheelchair)?: A Little Help needed standing up from a chair using your arms (e.g., wheelchair or bedside chair)?: A Little Help needed to walk in hospital room?: A Little Help needed climbing 3-5 steps with a railing? : A Lot 6 Click Score: 19    End of Session Equipment Utilized During Treatment: Oxygen;Gait belt Activity Tolerance: Patient tolerated treatment well Patient left: in bed;with call bell/phone within reach;with bed alarm set;with family/visitor present Nurse Communication: Mobility status PT Visit Diagnosis: Muscle weakness (generalized) (M62.81);Difficulty in walking, not elsewhere classified (R26.2)     Time: 1191-4782 PT Time Calculation (min) (ACUTE ONLY): 28 min  Charges:    $Gait Training: 23-37 mins PT General Charges $$ ACUTE PT VISIT: 1 Visit                     Van Clines, PT  Acute Rehabilitation Services Office [phone] Secure Chat welcomed    [name] 09/19/2023, 12:56 PM

## 2023-09-20 ENCOUNTER — Other Ambulatory Visit: Payer: Self-pay

## 2023-09-20 DIAGNOSIS — G9389 Other specified disorders of brain: Secondary | ICD-10-CM

## 2023-09-20 DIAGNOSIS — R918 Other nonspecific abnormal finding of lung field: Secondary | ICD-10-CM

## 2023-09-20 DIAGNOSIS — R410 Disorientation, unspecified: Secondary | ICD-10-CM | POA: Diagnosis not present

## 2023-09-20 LAB — BASIC METABOLIC PANEL
Anion gap: 12 (ref 5–15)
BUN: 15 mg/dL (ref 8–23)
CO2: 33 mmol/L — ABNORMAL HIGH (ref 22–32)
Calcium: 7.9 mg/dL — ABNORMAL LOW (ref 8.9–10.3)
Chloride: 99 mmol/L (ref 98–111)
Creatinine, Ser: 0.95 mg/dL (ref 0.44–1.00)
GFR, Estimated: >60 mL/min (ref >60)
Glucose, Bld: 133 mg/dL — ABNORMAL HIGH (ref 70–99)
Potassium: 3.2 mmol/L — ABNORMAL LOW (ref 3.5–5.1)
Sodium: 144 mmol/L (ref 135–145)

## 2023-09-20 LAB — CBC
HCT: 31.4 % — ABNORMAL LOW (ref 36.0–46.0)
Hemoglobin: 9.8 g/dL — ABNORMAL LOW (ref 12.0–15.0)
MCH: 25.7 pg — ABNORMAL LOW (ref 26.0–34.0)
MCHC: 31.2 g/dL (ref 30.0–36.0)
MCV: 82.4 fL (ref 80.0–100.0)
Platelets: 202 10*3/uL (ref 150–400)
RBC: 3.81 MIL/uL — ABNORMAL LOW (ref 3.87–5.11)
RDW: 17.6 % — ABNORMAL HIGH (ref 11.5–15.5)
WBC: 9.5 10*3/uL (ref 4.0–10.5)
nRBC: 0 % (ref 0.0–0.2)

## 2023-09-20 MED ORDER — POTASSIUM CHLORIDE CRYS ER 20 MEQ PO TBCR
40.0000 meq | EXTENDED_RELEASE_TABLET | Freq: Once | ORAL | Status: AC
  Administered 2023-09-20: 40 meq via ORAL
  Filled 2023-09-20: qty 2

## 2023-09-20 NOTE — Plan of Care (Signed)
  Problem: Health Behavior/Discharge Planning: Goal: Ability to manage health-related needs will improve Outcome: Progressing   Problem: Clinical Measurements: Goal: Ability to maintain clinical measurements within normal limits will improve Outcome: Progressing   Problem: Nutrition: Goal: Adequate nutrition will be maintained Outcome: Progressing   Problem: Pain Management: Goal: General experience of comfort will improve Outcome: Progressing

## 2023-09-20 NOTE — Care Management Important Message (Signed)
Important Message  Patient Details  Name: [name] MRN: [id number] Date of Birth: 10/01/1961   Important Message Given:  Yes - Medicare IM     Dorena Bodo 09/20/2023, 2:54 PM

## 2023-09-20 NOTE — Progress Notes (Signed)
Palliative Medicine Inpatient Follow Up Note HPI: [name] is a 62 y.o. 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  The Palliative care team has been asked to get involved to further assist with goals of care conversations.   Today's Discussion 09/20/2023  *Please note that this is a verbal dictation therefore any spelling or grammatical errors are due to the "Dragon [employer] One" system interpretation.  Chart reviewed inclusive of vital signs, progress notes, laboratory results, and diagnostic images.   Patient assessed at the bedside. She appears comfortable, denies pain or distress and endorses fatigue. Her daughter [name] is present for our scheduled family meeting.  Created space and opportunity for patient and family's thoughts and feelings on her current illness.  At this time, patient is still uncertain about whether she would like to proceed with chemotherapy or not.  Encouraged her to consider both pros and cons to this decision, exploring her unique priorities and potential concerns.  What is most important to her is that she is happy and can spend as much time as possible with her family.  She states she is willing to try chemotherapy "if it will do me good," but she does not want to go through this if not.  Counseled on the importance of considering what a "good" outcome is to her specifically, such as relief of symptoms, improved functionality, improved prognosis. Patient describes a good outcome as being happy.  We reviewed input from oncology and expectations from chemotherapy, as well as appropriateness of hospice if patient is  ready and goals of care aligned.  Patient's daughter suggests trying 1 round of chemotherapy and then reassessing her goals of care/preferences based on how that goes.  [name] finds this reasonable and is willing to proceed.  She is not worried about it and has no reservations.  I recommended outpatient palliative care follow-up at the cancer center for ongoing goals of care discussions based on how this round of chemotherapy and/or additional rounds call.  Patient and family are agreeable.  Patient's daughter [name] confirms that the plan will be to go home with home health and she would like follow-up on DME previously requested (hospital bed, wheelchair).  Offered to reach out to [facility].  Questions and concerns addressed/Palliative Support Provided.   Objective Assessment: Vital Signs Vitals:   09/19/23 2132 09/20/23 0402  BP: (!) 157/86 (!) 138/94  Pulse: (!) 110 100  Resp: 18 16  Temp: 98.2 F (36.8 C) 98.5 F (36.9 C)  SpO2: 100% 97%     Gen: Elderly African-American female in no acute distress HEENT: moist mucous membranes CV: Irregular rate and rhythm PULM: On 2.5 L nasal cannula breathing is even and nonlabored ABD: soft/nontender EXT: No edema Neuro: Alert and oriented  SUMMARY OF RECOMMENDATIONS   -Continue DNAR/DNI -Plan is to proceed with 1 round of chemotherapy and then continue goals of care discussions based on how this goes -Goal is to prioritize quality of life and patient's happiness, time with family -Placed referral to outpatient palliative care clinic at [facility] cancer center -Updated TOC on family's request to discuss DME -Psychosocial and emotional support provided -Ongoing palliative care support as needed  Billing based  on MDM: High ______________________________________________________________________________________ Richardson Dopp, PA-C [place] Palliative Medicine Team Team Cell Phone: [phone] Please utilize secure chat with  additional questions, if there is no response within 30 minutes please call the above phone number  Palliative Medicine Team providers are available by phone from 7am to 7pm daily and can be reached through the team cell phone.  Should this patient require assistance outside of these hours, please call the patient's attending physician.

## 2023-09-20 NOTE — Progress Notes (Signed)
Mobility Specialist: Progress Note   09/20/23 1018  Mobility  Activity Ambulated with assistance to bathroom;Transferred to/from [facility];Ambulated with assistance in room  Level of Assistance Contact guard assist, steadying assist  Assistive Device Front wheel walker  Distance Ambulated (ft) 15 ft  Activity Response Tolerated well  Mobility Referral Yes  $Mobility charge 1 Mobility  Mobility Specialist Start Time (ACUTE ONLY) [id number]  Mobility Specialist Stop Time (ACUTE ONLY) 0933  Mobility Specialist Time Calculation (min) (ACUTE ONLY) 47 min    Pt was agreeable to mobility session - received in bed. Agreed to use RW in room, typically uses rollator. ModI for bed mobility and initial STS, CG for STS from toilet and down to [facility]. Requested to go the BR. Completed void and BM and performed peri care independently. Attempted to ambulate afterwards but stated she needed to use BR again and opted for [facility]. Began to have diarrhea. Further ambulation deferred, NT and MS assisted with cleanup. Left on [facility] with all needs met, call bell in reach. Son in room and NT notified.   Maurene Capes Mobility Specialist Please contact via SecureChat or Rehab office at [phone]

## 2023-09-20 NOTE — TOC Progression Note (Signed)
Transition of Care [facility]) - Progression Note    Patient Details  Name: [name] MRN: [id number] Date of Birth: [birth year]  Transition of Care [facility]) CM/SW Contact  Tom-Johnson, Hershal Coria, RN Phone Number: 09/20/2023, 11:49 AM  Clinical Narrative:     GOC meeting today at 2pm. Palliative following.   CM will continue to follow as patient progresses with care towards discharge.           Expected Discharge Plan and Services                         DME Arranged: Hospital bed, Wheelchair manual DME Agency: [employer] Date DME Agency Contacted: 09/18/23 Time DME Agency Contacted: 1350 Representative spoke with at DME Agency: Vaughan Basta             Social Determinants of Health (SDOH) Interventions SDOH Screenings   Food Insecurity: No Food Insecurity (09/17/2023)  Housing: Patient Declined (09/17/2023)  Transportation Needs: No Transportation Needs (09/17/2023)  Utilities: Not At Risk (09/17/2023)  Alcohol Screen: Low Risk  (07/23/2023)  Depression (PHQ2-9): Low Risk  (07/23/2023)  Tobacco Use: Medium Risk (09/17/2023)    Readmission Risk Interventions    09/18/2023    1:52 PM  Readmission Risk Prevention Plan  Transportation Screening Complete  Medication Review (RN Care Manager) Referral to Pharmacy  PCP or Specialist appointment within 3-5 days of discharge Complete  HRI or Home Care Consult Complete  SW Recovery Care/Counseling Consult Complete  Palliative Care Screening Complete  Skilled Nursing Facility Not Applicable

## 2023-09-20 NOTE — Progress Notes (Signed)
PROGRESS NOTE  [name]  GEX:[id number] DOB: 20-Feb-1961 DOA: 09/17/2023 PCP: [name]., FNP   Brief Narrative: Patient is a 62 year old female with history of stage IV non-small cell lung cancer with metastasis currently in chemo/radiation therapies, breast cancer, sarcoidosis, COPD, hypertension  who presented with altered mental status from home.  She was supposed to have palliative chemo/radiation therapy initiated  on 11/4.  Patient slumped over the bed, became unresponsive, there was concern for left-sided weakness.  At baseline, she uses walker for ambulation.  Found to have new mets in the brain as per MRI on 08/29/2023 and was on oral Decadron.  On presentation, she was hemodynamically stable.  She was noted to have rhythmic movement of her lower jaw, she was confused on presentation, so it was  concerning for seizure.  CT head/MRI brain showed multiple brain mets with surrounding edema.  Started on IV Decadron.  Neurology consulted.    Radiation oncology, palliative care also following.neurology/radiation oncology signed off saying outpatient follow-up.  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  MRI showed unchanged size of contrast-enhancing masses of the left temporal lobe and left occipital lobe with  progression of surrounding vasogenic edema. .  Mildly elevated ammonia level.  Mental status remains improved. she is mostly oriented, on dysphagia 3 diet.    Metastatic lung cancer to brain: Imagings  above.  Finding of worsening edema.  Started on IV Decadron.  She was following with oncology/radiation oncology.  Was planned to start on palliative chemoradiation. Radiation oncology consulted.  Oncology, Dr. Arbutus Ped recommends outpatient follow-up.  CT chest follow-up done today showed decrease size of right upper lobe lung mass.  Radiation oncology and oncology recommends outpatient follow-up for discharge.  As per radiation oncology, will keep her on Decadron 4 mg, 4 times a day  Seizure: EEG obtained.  EEG obtained which showed evidence of etiopathogenicity arising from left hemisphere.  Neurology recommended to continue 750 mg Keppra twice daily, no further workup recommended  Chronic respiratory failure/COPD/sarcoidosis: On 2 L of oxygen at home.  Continue bronchodilators.  Currently not in exacerbation  Leukocytosis/elevated lactate/suspected postoobstructive pneumonia:UA did not show any evidence of UTI, chest x-ray showed right upper lobe mass with cavitation, right lower lobe atelectasis.  Pneumonia not excluded.  Started on antibiotic for possible post obstructive pneumonia, on ceftriaxone. lactate sepsis resolved, leukocytosis improved.  Can change antibiotics to oral on discharge.  Plan for total course of 5 days  Liver cirrhosis: Does appear to be compensated.  Has mild elevated ammonia, started on low-dose lactulose  Hypokalemia: Supplemented   GERD: Continue PPI  Depression/Anxiety: On zoloft  Generalized weakness: PT/OT consulted, recommended home health  Goals of care: History of metastatic lung cancer to brain.  Very poor prognosis.  Palliative care consulted for goals of care and following.  Family open for goals of care discussion.  Family meeting with palliative care at 2 pm  tomorrow       DVT prophylaxis:enoxaparin (LOVENOX) injection 40 mg Start: 09/17/23 1800     Code Status: Limited: Do not attempt resuscitation (DNR) -DNR-LIMITED -Do Not Intubate/DNI   Family Communication:  None available  Patient status:Inpatient  Patient is from :home  Anticipated discharge WG:NFAO  Estimated DC date:1-2 days, awaiting goals of care discussion  Consultants: Neurology, palliative care, adiation oncology  Procedures: EEG  Antimicrobials:  Anti-infectives (From admission, onward)    Start     Dose/Rate Route Frequency Ordered Stop   09/18/23 1815  cefTRIAXone (ROCEPHIN) 2 g in sodium chloride 0.9 % 100 mL IVPB        2 g 200 mL/hr over 30 Minutes Intravenous Every 24 hours 09/18/23 1111 09/22/23 1814   09/17/23 1815  cefTRIAXone (ROCEPHIN) 1 g in sodium chloride 0.9 % 100 mL IVPB  Status:  Discontinued        1 g 200 mL/hr over 30 Minutes Intravenous Every 24 hours 09/17/23 1810 09/18/23 1111       Subjective: Patient is resting comfortably. No new complaints.  Objective: Vitals:   09/19/23 0909 09/19/23 2132 09/20/23 0402 09/20/23 1654  BP: 135/79 (!) 157/86 (!) 138/94 127/84  Pulse: (!) 107 (!) 110 100 86  Resp: 20 18 16 18   Temp: 97.9 F (36.6 C) 98.2 F (36.8 C) 98.5 F (36.9 C) 98.6 F (37 C)  TempSrc:  Oral Oral   SpO2: 100% 100% 97% 100%  Weight:      Height:        Intake/Output Summary (Last 24 hours) at 09/20/2023 1747 Last data filed at 09/20/2023 0400 Gross per 24 hour  Intake 100 ml  Output --  Net 100 ml   Filed Weights   09/17/23 1142  Weight: 74.4 kg    Examination:  Exam:  Constitutional:  The patient is rsting comfortably. No new complaints. Respiratory:  No increased work of breathing. No wheezes, rales, or rhonchi No tactile fremitus Cardiovascular:  Regular rate and rhythm No murmurs, ectopy, or gallups. No lateral PMI. No thrills. Abdomen:  Abdomen is soft, non-tender, non-distended No hernias,  masses, or organomegaly Normoactive bowel sounds.  Musculoskeletal:  No cyanosis, clubbing, or edema Skin:  No rashes, lesions, ulcers palpation of skin: no induration or nodules Neurologic:  The patient is unable to cooperate with exam. Psychiatric:  The patient is unable to cooperate with exam  Data Reviewed: I have personally reviewed following labs and imaging studies  CBC: Recent Labs  Lab 09/17/23 1139 09/17/23 1144 09/17/23 1146 09/17/23 2006 09/20/23 0437  WBC 15.7*  --   --  10.7* 9.5  NEUTROABS 13.5*  --   --   --   --   HGB 11.3* 12.2 12.6 11.5* 9.8*  HCT 36.7 36.0 37.0 36.5 31.4*  MCV 85.0  --   --  83.1 82.4  PLT 192  --   --  162 202   Basic Metabolic Panel: Recent Labs  Lab 09/17/23 1139 09/17/23 1144 09/17/23 1146 09/17/23 2006 09/18/23 0437 09/19/23 0419 09/20/23 0437  NA 146* 141 141  --  143 141 144  K 3.2* 3.0* 3.1*  --  3.6 3.3* 3.2*  CL 93* 90*  --   --  96* 95* 99  CO2 35*  --   --   --  33* 35* 33*  GLUCOSE 145* 147*  --   --  128* 139* 133*  BUN 10 11  --   --  17 20 15   CREATININE 1.04* 0.90  --  1.00 1.16* 1.06* 0.95  CALCIUM 8.7*  --   --   --  8.0* 7.9* 7.9*     Recent Results (from the past 240 hour(s))  Blood Culture (Routine X 2)     Status: None (Preliminary result)   Collection Time: 09/17/23 12:00 PM   Specimen: BLOOD  Result Value Ref Range Status   Specimen Description BLOOD RIGHT ANTECUBITAL  Final   Special Requests   Final    BOTTLES DRAWN AEROBIC AND ANAEROBIC Blood Culture results may not be optimal due to an excessive volume of blood received in culture bottles   Culture   Final    NO GROWTH 3 DAYS Performed at [facility], [address] [place], [place] [address]    Report Status PENDING  Incomplete  Blood Culture (Routine X 2)     Status: None (Preliminary result)   Collection Time: 09/17/23 12:03 PM   Specimen: BLOOD RIGHT HAND  Result Value Ref Range Status   Specimen Description BLOOD RIGHT  HAND  Final   Special Requests   Final    BOTTLES DRAWN AEROBIC AND ANAEROBIC Blood Culture results may not be optimal due to an excessive volume of blood received in culture bottles   Culture   Final    NO GROWTH 3 DAYS Performed at [facility], [address], [place], [place] [address]    Report Status PENDING  Incomplete     Radiology Studies: CT CHEST W CONTRAST  Result Date: 09/19/2023 CLINICAL DATA:  Non-small cell lung cancer staging; * Tracking Code: BO * EXAM: CT CHEST WITH CONTRAST TECHNIQUE: Multidetector CT imaging of the chest was performed during intravenous contrast administration. RADIATION DOSE REDUCTION: This exam was performed according to the departmental dose-optimization program which includes automated exposure control, adjustment of the mA and/or kV according to patient size and/or use of iterative reconstruction technique. CONTRAST:  50mL OMNIPAQUE IOHEXOL 350 MG/ML SOLN COMPARISON:  PET-CT dated August 11, 2023; chest CT dated June 26, 2023 FINDINGS: Cardiovascular: Normal heart size. No pericardial effusion. Normal caliber thoracic aorta with moderate atherosclerotic disease. Mediastinum/Nodes: Esophagus and thyroid are unremarkable. No enlarged lymph nodes seen in the chest. Lungs/Pleura: Right upper lobe mass is decreased in size with new areas of cavitation measuring 4.8 x 3.6 cm, previously 6.3 x 5.1 cm. Stable irregular linear opacities of the bilateral lower lobes, likely due to scarring. New small right pleural effusion. Severe emphysema. Upper Abdomen: Stable cystic lesion of the anterior right kidney. No acute abnormality. Musculoskeletal: No chest wall abnormality. Unchanged diffuse sclerosis of the T3 vertebral body. IMPRESSION: 1. Right upper lobe mass is decreased in size with new areas of cavitation. 2. New small right pleural effusion. 3. Unchanged diffuse sclerosis of the T3 vertebral body. No evidence of progressive metastatic disease. 4. Aortic  Atherosclerosis (ICD10-I70.0) and Emphysema (ICD10-J43.9). Electronically Signed   By: Allegra Lai M.D.   On: 09/19/2023 11:11    Scheduled Meds:  aspirin  81 mg Oral Daily   atorvastatin  20 mg Oral Daily   dexamethasone  4 mg Oral Q6H   enoxaparin (LOVENOX) injection  40 mg Subcutaneous Q24H   lactulose  10 g Oral BID   levETIRAcetam  750 mg Oral BID   pantoprazole  40 mg Oral Daily   sertraline  50 mg Oral Daily  Continuous Infusions:  cefTRIAXone (ROCEPHIN)  IV 2 g (09/20/23 1718)     LOS: 3 days   Fran Lowes, MD [facility] P11/05/2023, 5:47 PM

## 2023-09-21 DIAGNOSIS — R410 Disorientation, unspecified: Secondary | ICD-10-CM | POA: Diagnosis not present

## 2023-09-21 LAB — CBC WITH DIFFERENTIAL/PLATELET
Abs Immature Granulocytes: 0.05 10*3/uL (ref 0.00–0.07)
Basophils Absolute: 0 10*3/uL (ref 0.0–0.1)
Basophils Relative: 0 %
Eosinophils Absolute: 0 10*3/uL (ref 0.0–0.5)
Eosinophils Relative: 0 %
HCT: 36 % (ref 36.0–46.0)
Hemoglobin: 11 g/dL — ABNORMAL LOW (ref 12.0–15.0)
Immature Granulocytes: 1 %
Lymphocytes Relative: 1 %
Lymphs Abs: 0.1 10*3/uL — ABNORMAL LOW (ref 0.7–4.0)
MCH: 25.6 pg — ABNORMAL LOW (ref 26.0–34.0)
MCHC: 30.6 g/dL (ref 30.0–36.0)
MCV: 83.7 fL (ref 80.0–100.0)
Monocytes Absolute: 0.5 10*3/uL (ref 0.1–1.0)
Monocytes Relative: 5 %
Neutro Abs: 9 10*3/uL — ABNORMAL HIGH (ref 1.7–7.7)
Neutrophils Relative %: 93 %
Platelets: 204 10*3/uL (ref 150–400)
RBC: 4.3 MIL/uL (ref 3.87–5.11)
RDW: 17.6 % — ABNORMAL HIGH (ref 11.5–15.5)
WBC: 9.6 10*3/uL (ref 4.0–10.5)
nRBC: 0 % (ref 0.0–0.2)

## 2023-09-21 LAB — BASIC METABOLIC PANEL
Anion gap: 11 (ref 5–15)
BUN: 13 mg/dL (ref 8–23)
CO2: 34 mmol/L — ABNORMAL HIGH (ref 22–32)
Calcium: 8.4 mg/dL — ABNORMAL LOW (ref 8.9–10.3)
Chloride: 100 mmol/L (ref 98–111)
Creatinine, Ser: 0.92 mg/dL (ref 0.44–1.00)
GFR, Estimated: >60 mL/min (ref >60)
Glucose, Bld: 131 mg/dL — ABNORMAL HIGH (ref 70–99)
Potassium: 3.5 mmol/L (ref 3.5–5.1)
Sodium: 145 mmol/L (ref 135–145)

## 2023-09-21 MED ORDER — AMOXICILLIN-POT CLAVULANATE 875-125 MG PO TABS: 1.0000 | ORAL_TABLET | Freq: Two times a day (BID) | ORAL | 0 refills | Status: AC

## 2023-09-21 MED ORDER — DEXAMETHASONE 4 MG PO TABS: 4.0000 mg | ORAL_TABLET | Freq: Four times a day (QID) | ORAL | 0 refills | Status: DC

## 2023-09-21 MED ORDER — LEVETIRACETAM 750 MG PO TABS: 750.0000 mg | ORAL_TABLET | Freq: Two times a day (BID) | ORAL | 0 refills | Status: DC

## 2023-09-21 MED ORDER — ASPIRIN 81 MG PO CHEW: 81.0000 mg | CHEWABLE_TABLET | Freq: Every day | ORAL | 0 refills | Status: DC

## 2023-09-21 MED ORDER — PANTOPRAZOLE SODIUM 40 MG PO TBEC: 40.0000 mg | DELAYED_RELEASE_TABLET | Freq: Every day | ORAL | 0 refills | Status: DC

## 2023-09-21 NOTE — Progress Notes (Signed)
Occupational Therapy Treatment Patient Details Name: [name] MRN: [id number] DOB: 07/10/1961 Today's Date: 09/21/2023   History of present illness 62 yo female presenting to ED 11/4 with AMS, global aphasia, and R facial droop. EEG with evidence of  epileptogenicity arising from L hemisphere.  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, solitary brain and T1 bone metastasis)   OT comments  Pt with c/o dizziness during session, advised RN to notify family for safety with DC, Bp remained stable during session. Pt originally ambulating and completing ADLs with CGA, had 2 LOBs after onset of dizziness needing min A to recover and mitigate balance deficits. OT to continue to follow pt acutely to address deficits and help transition to next level of care. Patient would benefit from post acute Home OT services to help maximize functional independence in natural environment       If plan is discharge home, recommend the following:  A little help with walking and/or transfers;A little help with bathing/dressing/bathroom;Assistance with cooking/housework;Supervision due to cognitive status   Equipment Recommendations  Wheelchair (measurements OT);Hospital bed;BSC/3in1    Recommendations for Other Services      Precautions / Restrictions Precautions Precautions: Fall Precaution Comments: O2 dependent, 2 L at baseline Restrictions Weight Bearing Restrictions: No       Mobility Bed Mobility Overal bed mobility: Modified Independent                  Transfers Overall transfer level: Needs assistance Equipment used: Rolling walker (2 wheels) Transfers: Sit to/from Stand Sit to Stand: Contact guard assist                 Balance Overall balance assessment: Needs assistance Sitting-balance support: No upper extremity supported, Feet supported Sitting balance-Leahy  Scale: Good     Standing balance support: No upper extremity supported, During functional activity Standing balance-Leahy Scale: Fair                             ADL either performed or assessed with clinical judgement   ADL       Grooming: Wash/dry face;Standing;Contact guard assist           Upper Body Dressing : Sitting;Set up;Contact guard assist Upper Body Dressing Details (indicate cue type and reason): doff/don gown     Toilet Transfer: BSC/3in1;Contact guard assist;Ambulation;Rolling walker (2 wheels)   Toileting- Clothing Manipulation and Hygiene: Sit to/from stand;Contact guard assist Toileting - Clothing Manipulation Details (indicate cue type and reason): for rear pericare     Functional mobility during ADLs: Contact guard assist;Rolling walker (2 wheels)      Extremity/Trunk Assessment Upper Extremity Assessment Upper Extremity Assessment: Generalized weakness   Lower Extremity Assessment Lower Extremity Assessment: Generalized weakness   Cervical / Trunk Assessment Cervical / Trunk Assessment: Normal    Vision   Additional Comments: Visual tracking and confrontation testing intact   Perception     Praxis      Cognition Arousal: Alert Behavior During Therapy: Flat affect Overall Cognitive Status: Within Functional Limits for tasks assessed                                          Exercises      Shoulder Instructions  General Comments Pt with c/o dizziness, 2 LOBs while grooming at sink needing min A to help recover. BP assessed as 125/78(93) seated and 125/79(88) standing. RN notified of dizziness, advised him to warm family members of dizzines upon DC to reduce the risk of fall at home    Pertinent Vitals/ Pain       Pain Assessment Pain Assessment: Faces Faces Pain Scale: No hurt  Home Living                                          Prior Functioning/Environment               Frequency  Min 1X/week        Progress Toward Goals  OT Goals(current goals can now be found in the care plan section)  Progress towards OT goals: Progressing toward goals  Acute Rehab OT Goals Patient Stated Goal: To get better; go home OT Goal Formulation: With patient/family Time For Goal Achievement: 10/03/23 Potential to Achieve Goals: Good  Plan      Co-evaluation                 AM-PAC OT "6 Clicks" Daily Activity     Outcome Measure   Help from another person eating meals?: A Little Help from another person taking care of personal grooming?: A Little Help from another person toileting, which includes using toliet, bedpan, or urinal?: A Little Help from another person bathing (including washing, rinsing, drying)?: A Little Help from another person to put on and taking off regular upper body clothing?: A Little Help from another person to put on and taking off regular lower body clothing?: A Little 6 Click Score: 18    End of Session Equipment Utilized During Treatment: Gait belt;Rolling walker (2 wheels);Oxygen  OT Visit Diagnosis: Unsteadiness on feet (R26.81);Cognitive communication deficit (R41.841);Muscle weakness (generalized) (M62.81)   Activity Tolerance Patient tolerated treatment well   Patient Left in bed;with call bell/phone within reach;with bed alarm set;Other (comment) (sitting EOB with tray in front of her, alarm on)   Nurse Communication Mobility status        Time: 1450-1513 OT Time Calculation (min): 23 min  Charges: OT General Charges $OT Visit: 1 Visit OT Treatments $Self Care/Home Management : 8-22 mins $Therapeutic Activity: 8-22 mins  09/21/2023  AB, OTR/L  Acute Rehabilitation Services  Office: [phone]   [name] 09/21/2023, 3:23 PM

## 2023-09-21 NOTE — Plan of Care (Signed)
Patient alert/oriented X4. Patient remains on 2L nasal cannula and AVS discharge instructions explained in detail. PIV removed prior to discharge and VSS. Patient compliant with medication administration and turned Q2 hours. Personal belongings are packed up at bedside.  Problem: Education: Goal: Knowledge of General Education information will improve Description: Including pain rating scale, medication(s)/side effects and non-pharmacologic comfort measures Outcome: Adequate for Discharge   Problem: Health Behavior/Discharge Planning: Goal: Ability to manage health-related needs will improve Outcome: Adequate for Discharge   Problem: Clinical Measurements: Goal: Ability to maintain clinical measurements within normal limits will improve Outcome: Adequate for Discharge   Problem: Clinical Measurements: Goal: Will remain free from infection Outcome: Adequate for Discharge   Problem: Clinical Measurements: Goal: Diagnostic test results will improve Outcome: Adequate for Discharge   Problem: Clinical Measurements: Goal: Respiratory complications will improve Outcome: Adequate for Discharge   Problem: Clinical Measurements: Goal: Cardiovascular complication will be avoided Outcome: Adequate for Discharge   Problem: Activity: Goal: Risk for activity intolerance will decrease Outcome: Adequate for Discharge   Problem: Nutrition: Goal: Adequate nutrition will be maintained Outcome: Adequate for Discharge   Problem: Coping: Goal: Level of anxiety will decrease Outcome: Adequate for Discharge   Problem: Elimination: Goal: Will not experience complications related to bowel motility Outcome: Adequate for Discharge   Problem: Elimination: Goal: Will not experience complications related to urinary retention Outcome: Adequate for Discharge   Problem: Pain Management: Goal: General experience of comfort will improve Outcome: Adequate for Discharge   Problem: Safety: Goal:  Ability to remain free from injury will improve Outcome: Adequate for Discharge   Problem: Skin Integrity: Goal: Risk for impaired skin integrity will decrease Outcome: Adequate for Discharge

## 2023-09-21 NOTE — Progress Notes (Signed)
Family will be on their way to transport the patient home. Daughter [name] is contacting her other sister for transport home and to ensure that all home equipment was delivered today as planned. Awaiting a call back to confirm.

## 2023-09-21 NOTE — Plan of Care (Signed)
Patient alert/oriented X2. Patient does not recall the place or why she went to the hospital. AVS discharge instructions explained to daughter in detail and nephew notified to bring oxygen tank to hospital. Patient remains on 2L nasal cannula. Personal belongings packed up at bedside and PIV removed prior to discharge. VSS. No complaints at this time.  Problem: Education: Goal: Knowledge of General Education information will improve Description: Including pain rating scale, medication(s)/side effects and non-pharmacologic comfort measures 09/21/2023 1637 by Rodman Pickle, RN Outcome: Progressing 09/21/2023 1404 by Rodman Pickle, RN Outcome: Adequate for Discharge   Problem: Health Behavior/Discharge Planning: Goal: Ability to manage health-related needs will improve 09/21/2023 1637 by Rodman Pickle, RN Outcome: Progressing 09/21/2023 1404 by Rodman Pickle, RN Outcome: Adequate for Discharge   Problem: Clinical Measurements: Goal: Ability to maintain clinical measurements within normal limits will improve 09/21/2023 1637 by Rodman Pickle, RN Outcome: Progressing 09/21/2023 1404 by Rodman Pickle, RN Outcome: Adequate for Discharge   Problem: Clinical Measurements: Goal: Will remain free from infection 09/21/2023 1637 by Rodman Pickle, RN Outcome: Progressing 09/21/2023 1404 by Rodman Pickle, RN Outcome: Adequate for Discharge   Problem: Clinical Measurements: Goal: Diagnostic test results will improve 09/21/2023 1637 by Rodman Pickle, RN Outcome: Progressing 09/21/2023 1404 by Rodman Pickle, RN Outcome: Adequate for Discharge   Problem: Clinical Measurements: Goal: Respiratory complications will improve 09/21/2023 1637 by Rodman Pickle, RN Outcome: Progressing 09/21/2023 1404 by Rodman Pickle, RN Outcome: Adequate for Discharge   Problem: Clinical Measurements: Goal: Cardiovascular complication will be avoided 09/21/2023 1637 by Rodman Pickle, RN Outcome:  Progressing 09/21/2023 1404 by Rodman Pickle, RN Outcome: Adequate for Discharge   Problem: Activity: Goal: Risk for activity intolerance will decrease 09/21/2023 1637 by Rodman Pickle, RN Outcome: Progressing 09/21/2023 1404 by Rodman Pickle, RN Outcome: Adequate for Discharge   Problem: Nutrition: Goal: Adequate nutrition will be maintained 09/21/2023 1637 by Rodman Pickle, RN Outcome: Progressing 09/21/2023 1404 by Rodman Pickle, RN Outcome: Adequate for Discharge   Problem: Coping: Goal: Level of anxiety will decrease 09/21/2023 1637 by Rodman Pickle, RN Outcome: Progressing 09/21/2023 1404 by Rodman Pickle, RN Outcome: Adequate for Discharge   Problem: Elimination: Goal: Will not experience complications related to bowel motility 09/21/2023 1637 by Rodman Pickle, RN Outcome: Progressing 09/21/2023 1404 by Rodman Pickle, RN Outcome: Adequate for Discharge   Problem: Elimination: Goal: Will not experience complications related to urinary retention 09/21/2023 1637 by Rodman Pickle, RN Outcome: Progressing 09/21/2023 1404 by Rodman Pickle, RN Outcome: Adequate for Discharge   Problem: Pain Management: Goal: General experience of comfort will improve 09/21/2023 1637 by Rodman Pickle, RN Outcome: Progressing 09/21/2023 1404 by Rodman Pickle, RN Outcome: Adequate for Discharge   Problem: Safety: Goal: Ability to remain free from injury will improve 09/21/2023 1637 by Rodman Pickle, RN Outcome: Progressing 09/21/2023 1404 by Rodman Pickle, RN Outcome: Adequate for Discharge   Problem: Skin Integrity: Goal: Risk for impaired skin integrity will decrease 09/21/2023 1637 by Rodman Pickle, RN Outcome: Progressing 09/21/2023 1404 by Rodman Pickle, RN Outcome: Adequate for Discharge

## 2023-09-21 NOTE — TOC Progression Note (Signed)
Transition of Care [facility]) - Progression Note    Patient Details  Name: [name] MRN: [id number] Date of Birth: 11/05/1961  Transition of Care [facility]) CM/SW Contact  Tom-Johnson, Hershal Coria, RN Phone Number: 09/21/2023, 1:02 PM  Clinical Narrative:     CM spoke with patient and daughter, [name] at bedside about home health recommendation and outpatient palliative service. [name] states they have no preference for home health.  CM called in referral to [facility] and Tresa Endo voiced acceptance, info on AVS.  Patient is currently active with Palliative services with [facility]. Misty notified.  [employer] to deliver hospital bed and wheelchair to patient's home today.   CM will continue to follow as patient progresses with care towards discharge.            Expected Discharge Plan and Services                         DME Arranged: Hospital bed, Wheelchair manual DME Agency: [employer] Date DME Agency Contacted: 09/18/23 Time DME Agency Contacted: 1350 Representative spoke with at DME Agency: Vaughan Basta             Social Determinants of Health (SDOH) Interventions SDOH Screenings   Food Insecurity: No Food Insecurity (09/17/2023)  Housing: Patient Declined (09/17/2023)  Transportation Needs: No Transportation Needs (09/17/2023)  Utilities: Not At Risk (09/17/2023)  Alcohol Screen: Low Risk  (07/23/2023)  Depression (PHQ2-9): Low Risk  (07/23/2023)  Tobacco Use: Medium Risk (09/17/2023)    Readmission Risk Interventions    09/18/2023    1:52 PM  Readmission Risk Prevention Plan  Transportation Screening Complete  Medication Review (RN Care Manager) Referral to Pharmacy  PCP or Specialist appointment within 3-5 days of discharge Complete  HRI or Home Care Consult Complete  SW Recovery Care/Counseling Consult Complete  Palliative Care Screening Complete  Skilled Nursing Facility Not Applicable

## 2023-09-21 NOTE — TOC Transition Note (Signed)
Transition of Care [facility]) - CM/SW Discharge Note   Patient Details  Name: [name] MRN: [id number] Date of Birth: 04-20-1961  Transition of Care [facility]) CM/SW Contact:  Tom-Johnson, Hershal Coria, RN Phone Number: 09/21/2023, 1:16 PM   Clinical Narrative:     Patient is scheduled for discharge today.  Readmission Risk Assessment done. Home health info, Outpatient f/u, hospital f/u and discharge instructions on AVS. Daughter, [name] to transport at discharge.  No further TOC needs noted.           Final next level of care: Home w Home Health Services Barriers to Discharge: Barriers Resolved   Patient Goals and CMS Choice CMS Medicare.gov Compare Post Acute Care list provided to:: Patient Choice offered to / list presented to : Patient, Adult Children [name])  Discharge Placement                  Patient to be transferred to facility by: Daughter Name of family member notified: [facility]    Discharge Plan and Services Additional resources added to the After Visit Summary for     Discharge Planning Services: CM Consult            DME Arranged: Hospital bed, Wheelchair manual DME Agency: [employer] Date DME Agency Contacted: 09/18/23 Time DME Agency Contacted: 1350 Representative spoke with at DME Agency: Vaughan Basta HH Arranged: PT, RN, OT, Disease Management HH Agency: [facility] Date [facility] Agency Contacted: 09/21/23 Time HH Agency Contacted: 1142 Representative spoke with at [facility] Agency: Tresa Endo  Social Determinants of Health (SDOH) Interventions SDOH Screenings   Food Insecurity: No Food Insecurity (09/17/2023)  Housing: Patient Declined (09/17/2023)  Transportation Needs: No Transportation Needs (09/17/2023)  Utilities: Not At Risk (09/17/2023)  Alcohol Screen: Low Risk  (07/23/2023)  Depression (PHQ2-9): Low Risk  (07/23/2023)  Tobacco Use: Medium Risk (09/17/2023)     Readmission Risk Interventions    09/18/2023    1:52 PM   Readmission Risk Prevention Plan  Transportation Screening Complete  Medication Review (RN Care Manager) Referral to Pharmacy  PCP or Specialist appointment within 3-5 days of discharge Complete  HRI or Home Care Consult Complete  SW Recovery Care/Counseling Consult Complete  Palliative Care Screening Complete  Skilled Nursing Facility Not Applicable

## 2023-09-21 NOTE — Progress Notes (Signed)
Physical Therapy Treatment Patient Details Name: [name] MRN: [id number] DOB: 1961/10/13 Today's Date: 09/21/2023   History of Present Illness 62 yo female presenting to ED 11/4 with AMS, global aphasia, and R facial droop. EEG with evidence of  epileptogenicity arising from L hemisphere. Pt c/o dizziness 11/8 PT/OT sessions however BP stable and no visible nystagmus. 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, solitary brain and T1 bone metastasis)    PT Comments  Pt received seated EOB, pt agreeable to therapy session and with good participation in transfer and gait training with RW. Pt instructed on seated/standing/supine BLE exercises with visual demo and handout to reinforce. Pt pulling up to ~1250 on IS, given IS to take home. Pt needing up to CGA for safety with transfers/gait, distance limited due to pt c/o mild dizziness. VSS on RA, BP stable sitting, standing at 0 mins and standing at 3 minutes, no visible nystagmus, RN notified. Pt continues to benefit from PT services to progress toward functional mobility goals.     If plan is discharge home, recommend the following: A little help with walking and/or transfers;Direct supervision/assist for medications management;Direct supervision/assist for financial management;Assist for transportation;Help with stairs or ramp for entrance;Supervision due to cognitive status   Can travel by private vehicle        Equipment Recommendations  Wheelchair (measurements PT);Wheelchair cushion (measurements PT);Hospital bed;BSC/3in1    Recommendations for Other Services       Precautions / Restrictions Precautions Precautions: Fall Precaution Comments: O2 dependent, 2 L at baseline; seizure precs Restrictions Weight Bearing Restrictions: No     Mobility  Bed Mobility               General bed mobility comments: Pt received EOB,  wanting to remain sitting EOB at end of session.    Transfers Overall transfer level: Needs assistance Equipment used: Rolling walker (2 wheels) Transfers: Sit to/from Stand Sit to Stand: Contact guard assist           General transfer comment: from EOB<>RW, cues for safe UE placement needed    Ambulation/Gait Ambulation/Gait assistance: Contact guard assist Gait Distance (Feet): 20 Feet Assistive device: Rolling walker (2 wheels) Gait Pattern/deviations: Decreased stride length, Decreased step length - right, Decreased step length - left       General Gait Details: Walked on 2.5 L supplemental O2 and O2 sats WFL; encouraged pt to self-monitor for activity tolerance, distance limited due to pt upcoming DC and c/o mild dizziness. BP stable at 0 and 3 mins standing and no visible nystagmus, RN notified.   Stairs             Wheelchair Mobility     Tilt Bed    Modified Rankin (Stroke Patients Only)       Balance Overall balance assessment: Needs assistance Sitting-balance support: No upper extremity supported, Feet supported Sitting balance-Leahy Scale: Good     Standing balance support: Bilateral upper extremity supported, During functional activity Standing balance-Leahy Scale: Fair Standing balance comment: with RW support                            Cognition Arousal: Alert Behavior During Therapy: Flat affect Overall Cognitive Status: No family/caregiver present to determine baseline cognitive functioning Area of Impairment: Orientation, Attention, Memory, Problem solving  Orientation Level: Disoriented to, Place, Situation Current Attention Level: Sustained Memory: Decreased short-term memory       Problem Solving: Slow processing, Requires verbal cues          Exercises Other Exercises Other Exercises: HEP handout brought to room, reviewed supine/seated/standing BLE therex via verbal/visual demo Other  Exercises: IS x 5 reps pt achieves ~[phone] encouraged hourly use    General Comments General comments (skin integrity, edema, etc.): Gait belt brought to room and adjusted for pt while daughter present so she is aware to use it for pt/family safety as she had mild c/o dizziness during session and this increases her risk of falls.      Pertinent Vitals/Pain Pain Assessment Pain Assessment: No/denies pain Faces Pain Scale: No hurt Pain Intervention(s): Monitored during session    Home Living                          Prior Function            PT Goals (current goals can now be found in the care plan section) Acute Rehab PT Goals Patient Stated Goal: to go home PT Goal Formulation: With patient/family Time For Goal Achievement: 10/02/23 Progress towards PT goals: Progressing toward goals    Frequency    Min 1X/week      PT Plan      Co-evaluation              AM-PAC PT "6 Clicks" Mobility   Outcome Measure  Help needed turning from your back to your side while in a flat bed without using bedrails?: None Help needed moving from lying on your back to sitting on the side of a flat bed without using bedrails?: A Little Help needed moving to and from a bed to a chair (including a wheelchair)?: A Little Help needed standing up from a chair using your arms (e.g., wheelchair or bedside chair)?: A Little Help needed to walk in hospital room?: A Little Help needed climbing 3-5 steps with a railing? : A Lot 6 Click Score: 18    End of Session Equipment Utilized During Treatment: Gait belt;Oxygen Activity Tolerance: Patient tolerated treatment well;Treatment limited secondary to medical complications (Comment);Other (comment) (dizziness but VSS) Patient left: in bed;with call bell/phone within reach;with family/visitor present (bed alarm off as pt daughter present and pt preparing to get dressed for DC) Nurse Communication: Mobility status;Other (comment)  (daughter arrived for DC and pt requesting plastic belongings bag for items) PT Visit Diagnosis: Muscle weakness (generalized) (M62.81);Difficulty in walking, not elsewhere classified (R26.2)     Time: 1540-1600 PT Time Calculation (min) (ACUTE ONLY): 20 min  Charges:    $Therapeutic Activity: 8-22 mins PT General Charges $$ ACUTE PT VISIT: 1 Visit                     Kaianna Dolezal P., PTA Acute Rehabilitation Services Secure Chat Preferred 9a-5:30pm Office: [phone]    Dorathy Kinsman [facility] 09/21/2023, 5:01 PM

## 2023-09-21 NOTE — Discharge Summary (Signed)
Physician Discharge Summary   Patient: [name] MRN: [id number] DOB: Apr 21, 1961  Admit date:     09/17/2023  Discharge date: 09/21/23  Discharge Physician: Fran Lowes   PCP: [name]., FNP   Recommendations at discharge:    Discharge to home with home health, Cabrera bed, wheelchair Follow up with oncology as outpatient Follow up with neurology as outpatient Follow up with PCP in 7-10 days. Follow up with Palliative Care at the [facility] at [facility].  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 [name] Course: Patient is a 62 year old female with history of stage IV non-small cell lung cancer with metastasis currently in chemo/radiation therapies, breast cancer, sarcoidosis, COPD, hypertension  who presented with altered mental status from home.  She was supposed to have palliative chemo/radiation therapy initiated  on 11/4.  Patient slumped over the bed, became unresponsive, there was concern for left-sided weakness.  At baseline, she uses walker for ambulation.  Found to have new mets in the brain as per MRI on 08/29/2023 and was on oral Decadron.  On presentation, she was hemodynamically stable.  She was noted to have rhythmic movement of her lower jaw, she was confused on presentation, so it was  concerning for seizure.  CT head/MRI brain showed multiple brain mets with surrounding edema.  Started on IV Decadron.  Neurology  consulted.    Radiation oncology, palliative care also following.neurology/radiation oncology signed off saying outpatient follow-up.  The family has met with palliative care. They have decided that the patient will undergo at least 1 round of chemotherapy. Palliative care has referred the patient to outpatient palliative care with the Palliative Care Team at the [name] at [name].  She will be discharged to home with home health PT/RN, Wheelchair, Cabrera bed, and 3 in 1.   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  MRI showed unchanged size of contrast-enhancing masses of the left temporal lobe and left occipital lobe with progression of surrounding vasogenic edema. .  Mildly elevated ammonia level.  Mental status remains improved. she is mostly oriented, on dysphagia 3 diet.     Metastatic lung cancer to brain: Imagings  above.  Finding of worsening edema.  Started on IV Decadron.  She was following with oncology/radiation oncology.  Was planned to start on palliative chemoradiation. Radiation oncology consulted.  Oncology, Dr. Arbutus Ped recommends outpatient follow-up.  CT chest follow-up done today showed decrease size of right upper lobe lung mass.  Radiation oncology and oncology recommends outpatient follow-up for discharge.  As per radiation oncology, will keep her on Decadron  4 mg, 4 times a day   Seizure: EEG obtained.  EEG obtained which showed evidence of etiopathogenicity arising from left hemisphere.  Neurology recommended to continue 750 mg Keppra twice daily, no further workup recommended   Chronic respiratory failure/COPD/sarcoidosis: On 2 L of oxygen at home.  Continue bronchodilators.  Currently not in exacerbation   Leukocytosis/elevated lactate/suspected postoobstructive pneumonia:UA did not show any evidence of UTI, chest x-ray showed right upper lobe mass with cavitation, right lower lobe atelectasis.  Pneumonia not excluded.  Started on antibiotic for possible post obstructive pneumonia, on ceftriaxone. lactate sepsis resolved, leukocytosis improved.  Can change antibiotics to oral on discharge.  Plan for total course of 5 days   Liver cirrhosis: Does appear to be compensated.  Has mild elevated ammonia, started on low-dose lactulose   Hypokalemia: Supplemented    GERD: Continue PPI   Depression/Anxiety: On zoloft   Generalized weakness: PT/OT consulted, recommended home health   Goals of care: History of metastatic lung cancer to brain.  Very poor prognosis.  Palliative care consulted for goals of care and following.  Family open for goals of care discussion.  Family meeting with palliative care at 2 pm tomorrow      Consultants: neurology, palliative caer Procedures performed: none  Disposition: Home health Diet recommendation:  Discharge Diet Orders (From admission, onward)     Start     Ordered   09/21/23 0000  Diet - low sodium heart healthy        09/21/23 1311           Regular diet DISCHARGE MEDICATION: Allergies as of 09/21/2023       Reactions   Codeine Other (See Comments)   Avoids-felt bad when taken before Pt claims she can have this but does not like to take        Medication List     STOP taking these medications    aspirin EC 81 MG tablet Replaced by: aspirin 81 MG chewable tablet   hydroxychloroquine  200 MG tablet Commonly known as: PLAQUENIL   oxyCODONE 5 MG immediate release tablet Commonly known as: Roxicodone       TAKE these medications    acetaminophen 325 MG tablet Commonly known as: TYLENOL Take 650 mg by mouth every 6 (six) hours as needed for mild pain or moderate pain.   albuterol (2.5 MG/3ML) 0.083% nebulizer solution Commonly known as: PROVENTIL Take 3 mLs (2.5 mg total) by nebulization every 6 (six) hours as needed for wheezing or shortness of breath. What changed: Another medication with the same name was changed. Make sure you understand how and when to take each.   albuterol 108 (90 Base) MCG/ACT inhaler Commonly known as: VENTOLIN HFA INHALE 2 PUFFS EVERY 6 HOURS AS NEEDED FOR WHEEZING OR SHORTNESS OF BREATH What changed: See the new instructions.   amoxicillin-clavulanate 875-125 MG tablet Commonly known as: AUGMENTIN Take 1 tablet by mouth 2 (two) times daily for 6 days.   aspirin 81 MG chewable tablet Chew 1 tablet (81 mg total) by mouth daily. Start taking on: September 22, 2023 Replaces: aspirin EC 81 MG tablet   atorvastatin 20 MG tablet Commonly known as: LIPITOR TAKE 1 TABLET (20 MG TOTAL) BY MOUTH DAILY.  dexamethasone 4 MG tablet Commonly known as: DECADRON Take 1 tablet (4 mg total) by mouth every 6 (six) hours. What changed:  when to take this Another medication with the same name was removed. Continue taking this medication, and follow the directions you see here.   fluticasone 50 MCG/ACT nasal spray Commonly known as: FLONASE USE 2 SPRAYS IN BOTH  NOSTRILS DAILY AS NEEDED  FOR ALLERGIES What changed: See the new instructions.   levETIRAcetam 750 MG tablet Commonly known as: KEPPRA Take 1 tablet (750 mg total) by mouth 2 (two) times daily.   lidocaine-prilocaine cream Commonly known as: EMLA Apply to affected area once What changed:  how much to take how to take this when to take this additional instructions   metoprolol  tartrate 25 MG tablet Commonly known as: LOPRESSOR Take 1 tablet (25 mg total) by mouth daily.   montelukast 10 MG tablet Commonly known as: SINGULAIR Take 10 mg by mouth at bedtime.   multivitamin capsule Take 1 capsule by mouth daily with breakfast.   omeprazole 20 MG capsule Commonly known as: PRILOSEC TAKE 1 CAPSULE EVERY DAY What changed: when to take this   ondansetron 4 MG tablet Commonly known as: ZOFRAN Take 1 tablet (4 mg total) by mouth every 6 (six) hours as needed for nausea.   OXYGEN Inhale 2-3 L/min into the lungs See admin instructions. Inhale 2-3 L/min into the lungs CONTINUOUSLY; 2 L/min at rest and 3 L/min if exerted   prochlorperazine 10 MG tablet Commonly known as: COMPAZINE Take 1 tablet (10 mg total) by mouth every 6 (six) hours as needed for nausea or vomiting.   sertraline 50 MG tablet Commonly known as: ZOLOFT TAKE 1 TABLET EVERY DAY   Stiolto Respimat 2.5-2.5 MCG/ACT Aers Generic drug: Tiotropium Bromide-Olodaterol USE 2 INHALATIONS BY MOUTH DAILY               Durable Medical Equipment  (From admission, onward)           Start     Ordered   09/21/23 1304  DME 3-in-1  Once        09/21/23 1311   09/21/23 1304  DME standard manual wheelchair with seat cushion  Once       Comments: Patient suffers from DEbility which impairs their ability to perform daily activities like toileting in the home.  A walker will not resolve issue with performing activities of daily living. A wheelchair will allow patient to safely perform daily activities. Patient can safely propel the wheelchair in the home or has a caregiver who can provide assistance. Length of need Lifetime. Accessories: elevating leg rests (ELRs), wheel locks, extensions and anti-tippers.   09/21/23 1311   09/18/23 1510  For home use only DME Cabrera bed  Once       Comments: Patient requires the head of bed to be elevated more than 30 degrees most of the time d/t Lung Ca with mets  to the Brain, COPD, on 2L O2 with Therapeutic Mattress.  Question Answer Comment  Length of Need 12 Months   Patient has (list medical condition): Stage IV Non-small Cell Lung Cancer with mets to the Brain, Sarcoidosis.   The above medical condition requires: Patient requires the ability to reposition frequently   Head must be elevated greater than: 45 degrees   Bed type Semi-electric      09/18/23 1511   09/18/23 1457  For home use only DME lightweight manual wheelchair with seat cushion  Once       Comments: Patient suffers from Lung Cancer which impairs their ability to perform daily activities like bathing, dressing, and toileting in the home.  A cane or walker will not resolve  issue with performing activities of daily living. A wheelchair will allow patient to safely perform daily activities. Patient is not able to propel themselves in the home using a standard weight wheelchair due to endurance and general weakness. Patient can self propel in the lightweight wheelchair. Length of need 12 months . Accessories: elevating leg rests (ELRs), wheel locks, extensions and anti-tippers.   09/18/23 1501            Follow-up Information     [facility] Follow up.   Specialty: Home Health Services Why: Someone will call Contact information: [address] [place] [address] [phone]         [facility] Follow up.   Contact information: [address] [place] [address] [phone]               Discharge Exam: Ceasar Mons Weights   09/17/23 1142  Weight: 74.4 kg   Vitals:   09/21/23 0436 09/21/23 0904  BP: (!) 133/96 129/88  Pulse: 87 86  Resp: 16 17  Temp: 98 F (36.7 C) 97.6 F (36.4 C)  SpO2: 100% 100%    Exam:  Constitutional:  The patient is awake, alert, and oriented x 3. No acute distress. Respiratory:  No increased work of breathing. No wheezes, rales, or rhonchi No tactile fremitus Cardiovascular:   Regular rate and rhythm No murmurs, ectopy, or gallups. No lateral PMI. No thrills. Abdomen:  Abdomen is soft, non-tender, non-distended No hernias, masses, or organomegaly Normoactive bowel sounds.  Musculoskeletal:  No cyanosis, clubbing, or edema Skin:  No rashes, lesions, ulcers palpation of skin: no induration or nodules Neurologic:  CN 2-12 intact Sensation all 4 extremities intact Psychiatric:  Mental status Mood, affect appropriate Orientation to person, place, time  judgment and insight appear intact   Condition at discharge: fair  The results of significant diagnostics from this hospitalization (including imaging, microbiology, ancillary and laboratory) are listed below for reference.   Imaging Studies: CT CHEST W CONTRAST  Result Date: 09/19/2023 CLINICAL DATA:  Non-small cell lung cancer staging; * Tracking Code: BO * EXAM: CT CHEST WITH CONTRAST TECHNIQUE: Multidetector CT imaging of the chest was performed during intravenous contrast administration. RADIATION DOSE REDUCTION: This exam was performed according to the departmental dose-optimization program which includes automated exposure control, adjustment of the mA and/or kV according to patient size and/or use of iterative reconstruction technique. CONTRAST:  50mL OMNIPAQUE IOHEXOL 350 MG/ML SOLN COMPARISON:  PET-CT dated August 11, 2023; chest CT dated June 26, 2023 FINDINGS: Cardiovascular: Normal heart size. No pericardial effusion. Normal caliber thoracic aorta with moderate atherosclerotic disease. Mediastinum/Nodes: Esophagus and thyroid are unremarkable. No enlarged lymph nodes seen in the chest. Lungs/Pleura: Right upper lobe mass is decreased in size with new areas of cavitation measuring 4.8 x 3.6 cm, previously 6.3 x 5.1 cm. Stable irregular linear opacities of the bilateral lower lobes, likely due to scarring. New small right pleural effusion. Severe emphysema. Upper Abdomen: Stable cystic lesion of the  anterior right kidney. No acute abnormality. Musculoskeletal: No chest wall abnormality. Unchanged diffuse sclerosis of the T3 vertebral body. IMPRESSION: 1. Right upper lobe mass is decreased in size with new areas of cavitation. 2. New small right pleural effusion. 3. Unchanged diffuse sclerosis of  the T3 vertebral body. No evidence of progressive metastatic disease. 4. Aortic Atherosclerosis (ICD10-I70.0) and Emphysema (ICD10-J43.9). Electronically Signed   By: Allegra Lai M.D.   On: 09/19/2023 11:11   Overnight EEG with video  Result Date: 09/18/2023 Charlsie Quest, MD     09/18/2023  8:37 AM Patient Name: RASHAWNDA SAMFORD MRN: [id number] Epilepsy Attending: Charlsie Quest Referring Physician/Provider: Marjorie Smolder, NP Duration: 09/18/2023 0020 to 0830  Patient history:  62 y.o. 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 EEG to evaluate for seizure  Level of alertness:  comatose/ lethargic-->awake  AEDs during EEG study: LEV  Technical aspects: This EEG study was done with scalp electrodes positioned according to the 10-20 International system of electrode placement. Electrical activity was reviewed with band pass filter of 1-70Hz , sensitivity of 7 uV/mm, display speed of 15mm/sec with a 60Hz  notched filter applied as appropriate. EEG data were recorded continuously and digitally stored.  Video monitoring was available and reviewed as appropriate.  Description: EEG initially showed lateralized periodic discharges ( LPDs) with overriding fast activity were noted in left hemisphere with fluctuating frequency of 0.5 to 2 Hz, at times with overlying rhythmicity, without definite evolution. EEG also showed continuous generalized 3  to 6 Hz theta- delta slowing admixed with 12 to 15 Hz with activity. Gradually, the morphology of LPDs improved and there was no overriding fast activity.  Additionally the frequency of LPDs improved to 0.25-0.5Hz .  EEG also showed posterior dominant rhythm of 8 Hz activity of moderate voltage (25-35 uV) seen predominantly in posterior head regions, symmetric and reactive to eye opening and eye closing.  Intermittent generalized and lateralized left hemisphere 3-6 Esti Deppen delta slowing was also noted.  Hyperventilation and photic stimulation were not performed.    ABNORMALITY - Lateralized periodic discharges ( LPD), left hemisphere - Continuous slow, generalized and lateralized left hemisphere  IMPRESSION: This study initially showed evidence of epileptogenicity arising from left hemisphere with increased risk of seizure recurrence.  Additionally there was moderate diffuse encephalopathy.  Gradually, EEG improved and was suggestive of cortical dysfunction in left hemisphere likely secondary to underlying structural abnormality as well as mild diffuse encephalopathy.  No seizures were seen during the study.  Charlsie Quest   EEG adult  Result Date: 09/18/2023 Charlsie Quest, MD     09/18/2023  8:33 AM Patient Name: TAMEISHA ONNEN MRN: [id number] Epilepsy Attending: Charlsie Quest Referring Physician/Provider: Alessandra Bevels, MD Date: 09/17/2023 Duration: 26.50 mins Patient history:  62 y.o. 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 EEG to evaluate for seizure Level of alertness:  comatose/ lethargic AEDs during EEG study: LEV Technical aspects: This EEG study was done with scalp electrodes positioned according to the 10-20  International system of electrode placement. Electrical activity was reviewed with band pass filter of 1-70Hz , sensitivity of 7 uV/mm, display speed of 66mm/sec with a 60Hz  notched filter applied as appropriate. EEG data were recorded continuously and digitally stored.  Video monitoring was available and reviewed as appropriate.  Description: EEG showed continuous generalized 3 to 6 Hz theta- delta slowing admixed with 12 to 15 Hz with activity.  Lateralized periodic discharges with overriding fast activity were noted in left hemisphere with fluctuating frequency of 0.5 to 2 Hz, at times with overlying rhythmicity, without definite evolution. Hyperventilation and photic stimulation were not performed.   ABNORMALITY -Lateralized periodic discharges with overriding fast activity ( LPD+F), left hemisphere - Continuous slow, generalized IMPRESSION: This study showed evidence of epileptogenicity arising from left hemisphere.  This EEG pattern is on the ictal-interictal continuum with increased risk for seizures.  Additionally there is moderate to severe diffuse encephalopathy.  No definite seizures were seen during the study. Charlsie Quest   MR BRAIN W WO CONTRAST  Result Date: 09/17/2023 CLINICAL DATA:  Altered mental status and seizure. Brain metastases. EXAM: MRI HEAD WITHOUT AND WITH CONTRAST TECHNIQUE: Multiplanar, multiecho pulse sequences of the brain and surrounding structures were obtained without and with intravenous contrast. CONTRAST:  7mL GADAVIST GADOBUTROL 1 MMOL/ML IV SOLN COMPARISON:  08/29/2023 FINDINGS: Brain: There is abnormal diffusion restriction within the medial left PCA territory, including the dorsomedial left thalamus. There is also diffusion restriction of the left insula. There is vasogenic edema within the anterior left temporal lobe and left occipital lobe, near the regions of diffusion restriction. There contrast-enhancing masses at both locations. The left temporal mass measures 10  mm, unchanged (series 19, image 27). Left occipital mass measures 10 mm, unchanged (image 23). Lesion of the right postcentral gyrus measures 7 mm, previously 9 mm (image 48). There is a focus of likely extra-axial contrast enhancement at the left cerebellopontine angle that measures 7 mm, previously 3 mm (series 19, image 18). The midline structures are normal. Vascular: Normal flow voids. Skull and upper cervical spine: Normal calvarium and skull base. Visualized upper cervical spine and soft tissues are normal. Sinuses/Orbits:No paranasal sinus fluid levels or advanced mucosal thickening. No mastoid or middle ear effusion. Normal orbits. IMPRESSION: 1. Unchanged size of contrast-enhancing masses of the left temporal lobe and left occipital lobe with progression of surrounding vasogenic edema. 2. Areas of diffusion restriction of the left insula and left PCA territory, near the above described lesions, may be a postictal phenomenon. 3. Slight decrease in size of the right postcentral gyrus metastasis. 4. Increased size of a 7 mm extra-axial contrast-enhancing mass at the left cerebellopontine angle, which could be a meningioma or extra-axial metastasis. Electronically Signed   By: Deatra Robinson M.D.   On: 09/17/2023 19:51   DG Chest Port 1 View  Result Date: 09/17/2023 CLINICAL DATA:  Altered mental status.  Lung cancer. EXAM: PORTABLE CHEST 1 VIEW COMPARISON:  08/27/2023. FINDINGS: There is a right chest wall port a catheter with tip in the distal SVC. Stable cardiomediastinal contours. The right upper lobe treated lung mass is again noted with signs of central cavitary change. There is asymmetric right lung volume loss with increase opacification in the right lower lung compared with the previous exam. Left lung appears clear. IMPRESSION: 1. Increased opacification in the right lower lung compared with the previous exam. Atelectasis and or pleural effusion. Underlying pneumonia would be difficult to exclude.  2. Stable appearance of right upper lobe lung mass with central cavitation. Electronically Signed   By: Signa Kell M.D.   On: 09/17/2023 15:41   CT HEAD WO CONTRAST  Result Date: 09/17/2023 CLINICAL DATA:  Mental status change, unknown cause EXAM: CT HEAD WITHOUT CONTRAST TECHNIQUE: Contiguous axial images were obtained from  the base of the skull through the vertex without intravenous contrast. RADIATION DOSE REDUCTION: This exam was performed according to the departmental dose-optimization program which includes automated exposure control, adjustment of the mA and/or kV according to patient size and/or use of iterative reconstruction technique. COMPARISON:  Brain MR 08/29/23 FINDINGS: Brain: No hemorrhage. No hydrocephalus. No extra-axial fluid collection. No CT evidence of an acute cortical infarct. 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 There is no definite evidence of new metastatic disease on this noncontrast enhanced exam. Vascular: No hyperdense vessel or unexpected calcification. Skull: Normal. Negative for fracture or focal lesion. Sinuses/Orbits: No acute finding. Other: None. IMPRESSION: 1. No CT evidence of an acute cortical infarct or intracranial hemorrhage. 2. Redemonstrated vasogenic edema at sites of known metastatic disease in the anterior left temporal lobe and in the left occipital lobe. There is no definite evidence of new metastatic disease on this noncontrast enhanced exam. If more detailed characterization for new metastatic disease is clinically warranted, further evaluation with repeat brain MRI recommended. Electronically Signed   By: Lorenza Cambridge M.D.   On: 09/17/2023 14:59   MR BRAIN W WO CONTRAST  Result Date: 08/30/2023 CLINICAL DATA:  Metastatic disease evaluation EXAM: MRI HEAD WITHOUT AND WITH CONTRAST TECHNIQUE: Multiplanar, multiecho pulse sequences of the brain and  surrounding structures were obtained without and with intravenous contrast. CONTRAST:  7.84mL GADAVIST GADOBUTROL 1 MMOL/ML IV SOLN COMPARISON:  07/20/2023 FINDINGS: Brain: Redemonstrated peripherally enhancing lesion in the right postcentral gyrus, which now measures up to 9 x 9 mm (series 16, image 127), previously 13 x 12 mm slightly decreased associated edema. New peripherally enhancing lesion in the left temporal lobe, which measures up to 10 x 8 mm (series 16, image 56). Additional peripherally enhancing lesion in the left occipital lobe measures up to 10 x 9 mm (series 16, image 56). The lesions are associated with moderate surrounding edema. No evidence of acute infarct chin, hemorrhage, mass effect, or midline shift. No hydrocephalus or extra-axial collection. Vascular: Normal arterial flow voids. Normal arterial and venous enhancement. Skull and upper cervical spine: Normal marrow signal. Sinuses/Orbits: Clear paranasal sinuses. Status post bilateral lens replacements. Other: The mastoids are well aerated. IMPRESSION: 1. New peripherally enhancing lesions in the left temporal and occipital lobes, consistent with new metastatic disease. 2. Slightly decrease in the size of a previously noted right postcentral gyrus lesion, with decreased associated edema. Electronically Signed   By: Wiliam Ke M.D.   On: 08/30/2023 04:19   CT Head Wo Contrast  Result Date: 08/27/2023 CLINICAL DATA:  Mental status change, unknown cause EXAM: CT HEAD WITHOUT CONTRAST TECHNIQUE: Contiguous axial images were obtained from the base of the skull through the vertex without intravenous contrast. RADIATION DOSE REDUCTION: This exam was performed according to the departmental dose-optimization program which includes automated exposure control, adjustment of the mA and/or kV according to patient size and/or use of iterative reconstruction technique. COMPARISON:  MRI 07/20/2023. FINDINGS: Brain: Hypodensity in the left occipital  region appears new. Similar edema in the right frontoparietal region. No substantial mass effect. No midline shift. No hydrocephalus. No acute hemorrhage. Vascular: No hyperdense vessel. Skull: No acute fracture. Sinuses/Orbits: Clear sinuses.  No acute orbital findings. Other: No mastoid effusions. IMPRESSION: 1. Hypodensity in the left occipital region could represent interval (since 07/20/2023) infarct or metastasis. Recommend MRI head with contrast to further evaluate. 2. Grossly similar edema  in the right frontoparietal region correlates with known metastatic lesion seen on prior MRI. No progressive mass effect. Electronically Signed   By: Feliberto Harts M.D.   On: 08/27/2023 17:12   DG Chest Port 1 View  Result Date: 08/27/2023 CLINICAL DATA:  Weakness.  History of lung cancer EXAM: PORTABLE CHEST 1 VIEW COMPARISON:  X-ray 07/09/2023.  PET-CT scan 08/03/2023 FINDINGS: Volume loss of the right hemithorax with slightly increasing opacity in the right upper lung compared to prior x-ray. Surgical clips in the right axillary region. Left lung is grossly clear. No pneumothorax, effusion or edema. Normal cardiopericardial silhouette. Interval placement of a right IJ chest port with the tip along the upper SVC. Overlapping cardiac leads. IMPRESSION: Chest port. Increasing parenchymal opacity in the right upper lung. Acute infiltrative process is possible. Please correlate for known history of a right upper lobe lung cancer. Electronically Signed   By: Karen Kays M.D.   On: 08/27/2023 14:33    Microbiology: Results for orders placed or performed during the [facility] encounter of 09/17/23  Blood Culture (Routine X 2)     Status: None (Preliminary result)   Collection Time: 09/17/23 12:00 PM   Specimen: BLOOD  Result Value Ref Range Status   Specimen Description BLOOD RIGHT ANTECUBITAL  Final   Special Requests   Final    BOTTLES DRAWN AEROBIC AND ANAEROBIC Blood Culture results may not be optimal due  to an excessive volume of blood received in culture bottles   Culture   Final    NO GROWTH 4 DAYS Performed at [facility], [address] [place], [place] [address]    Report Status PENDING  Incomplete  Blood Culture (Routine X 2)     Status: None (Preliminary result)   Collection Time: 09/17/23 12:03 PM   Specimen: BLOOD RIGHT HAND  Result Value Ref Range Status   Specimen Description BLOOD RIGHT HAND  Final   Special Requests   Final    BOTTLES DRAWN AEROBIC AND ANAEROBIC Blood Culture results may not be optimal due to an excessive volume of blood received in culture bottles   Culture   Final    NO GROWTH 4 DAYS Performed at [facility], [address] [place], [place] [address]    Report Status PENDING  Incomplete   *Note: Due to a large number of results and/or encounters for the requested time period, some results have not been displayed. A complete set of results can be found in Results Review.    Labs: CBC: Recent Labs  Lab 09/17/23 1139 09/17/23 1144 09/17/23 1146 09/17/23 2006 09/20/23 0437 09/21/23 0928  WBC 15.7*  --   --  10.7* 9.5 9.6  NEUTROABS 13.5*  --   --   --   --  9.0*  HGB 11.3* 12.2 12.6 11.5* 9.8* 11.0*  HCT 36.7 36.0 37.0 36.5 31.4* 36.0  MCV 85.0  --   --  83.1 82.4 83.7  PLT 192  --   --  162 202 204   Basic Metabolic Panel: Recent Labs  Lab 09/17/23 1139 09/17/23 1144 09/17/23 1146 09/17/23 2006 09/18/23 0437 09/19/23 0419 09/20/23 0437 09/21/23 0928  NA 146* 141 141  --  143 141 144 145  K 3.2* 3.0* 3.1*  --  3.6 3.3* 3.2* 3.5  CL 93* 90*  --   --  96* 95* 99 100  CO2 35*  --   --   --  33* 35* 33* 34*  GLUCOSE 145* 147*  --   --  128* 139* 133* 131*  BUN 10 11  --   --  17 20 15 13   CREATININE 1.04* 0.90  --  1.00 1.16* 1.06* 0.95 0.92  CALCIUM 8.7*  --   --   --  8.0* 7.9* 7.9* 8.4*   Liver Function Tests: Recent Labs  Lab 09/17/23 1139  AST 30  ALT 14  ALKPHOS 56  BILITOT 1.4*  PROT 6.3*  ALBUMIN 3.2*    CBG: Recent Labs  Lab 09/17/23 1141  GLUCAP 142*    Discharge time spent: greater than 30 minutes.  Signed: Jacynda Brunke, DO [facility] 09/21/2023

## 2023-09-22 LAB — CULTURE, BLOOD (ROUTINE X 2)
Culture: NO GROWTH
Culture: NO GROWTH

## 2023-09-24 ENCOUNTER — Telehealth: Payer: Self-pay | Admitting: Radiation Therapy

## 2023-09-24 ENCOUNTER — Encounter: Payer: Self-pay | Admitting: Internal Medicine

## 2023-09-24 ENCOUNTER — Inpatient Hospital Stay: Payer: 59

## 2023-09-24 ENCOUNTER — Other Ambulatory Visit: Payer: Self-pay | Admitting: Radiation Therapy

## 2023-09-24 DIAGNOSIS — C7931 Secondary malignant neoplasm of brain: Secondary | ICD-10-CM

## 2023-09-24 NOTE — Telephone Encounter (Signed)
I spoke with [name] to inform her that Dr. [name] would like her to see neuro-oncologist, Dr. [name], for seizure management. A referral has been entered and she should expect a call from Dr. Liana Gerold team to set up a visit. [name] is happy to be back home from the hospital. She was thankful for the call and does not have any questions or concerns at this time.   Jalene Mullet R.T.(R)(T) Radiation Special Procedures Navigator

## 2023-09-25 ENCOUNTER — Telehealth: Payer: Self-pay | Admitting: Internal Medicine

## 2023-09-25 ENCOUNTER — Other Ambulatory Visit: Payer: 59

## 2023-09-25 NOTE — Progress Notes (Signed)
[facility] OFFICE PROGRESS NOTE  [name]., FNP [address] [place] [address]  DIAGNOSIS: Stage IV (T3, N0, M1C) non-small cell lung cancer, squamous cell carcinoma presented with right upper lobe lung mass in addition to solitary brain metastasis and T1 bone metastasis. She was diagnosed in August 2023   PDL1 Expression: 20%  PRIOR THERAPY:  1) SRS to the metastatic brain lesion on 07/23/2023 under the care of Erin. [name]. 2) SBRT to the right upper lobe lung mass and SBRT to the solitary osseous metastasis at T1. Last dose on 08/27/23 3) SRS to the new left temporal and occipital lobe metastatic brain lesion under the care of Erin. [name], expected on 09/06/23.  CURRENT THERAPY:  1) palliative systemic chemoimmunotherapy with carboplatin for AUC of 5, paclitaxel 175 Mg/M2 and Keytruda 200 Mg IV every 3 weeks with Neulasta support. First dose expected on 09/28/23.   INTERVAL HISTORY: [name] 62 y.o. female returns to the clinic today for follow-up visit accompanied by two of her daughters  In summary, the patient has stage IV lung cancer.  She was found to have metastatic disease to the brain and underwent SBRT, the most recent being on 09/06/2023.  She was supposed to start systemic chemotherapy in early November but presented to the emergency room with altered mental status.  Her family found her slumped over the bed unresponsive and with associated left-sided weakness.  A CT of the head/MRI of the brain showed multiple brain mets with surrounding edema and she was started on IV Decadron.  Radiation oncology evaluated the patient while in the hospital who recommended 4 mg of Decadron 4 times a day.  She also was put on Keppra for seizure prophylaxis and she is referred to Erin. [name] has an appointment on 11/26.  She was discharged home with home health PT and RN , a wheelchair, and a hospital bed.  Palliative care consulted the family in the hospital and the  patient and her family decided they wanted to try to undergo at least 1 round of chemotherapy.   Therefore her chemotherapy was rescheduled for today.  While in the hospital, she was started on antibiotics for possible postobstructive pneumonia.   We received a call yesterday from the family stating that the patient was not interested in treatment anymore but we kept the appointment today to discuss with her further.   Since being discharged, she is feeling fair.  Today except she sometimes has some numbness in her right foot without any pain and some insomnia due to the steroids.  She has control of her bowels and bladder. Denies leg pain or swelling.  Denies unusual back pain.  She denies any fever, chills, or night sweats.  She has a good appetite.  She reports her baseline dyspnea on exertion is about the same for which she is on 2 L of supplemental oxygen.  If she is exerting herself she may increase her oxygen to 3 to 4 L.  Denies any cough, chest pain, or hemoptysis.  She denies any nausea or vomiting.  She denies any diarrhea or constipation.  Denies any headaches or vision changes.  She is here today for evaluation and for consideration of starting cycle #1.   MEDICAL HISTORY: Past Medical History:  Diagnosis Date   Acute on chronic respiratory failure (HCC) 10/09/2016   Allergic rhinitis    Breast cancer (HCC) 2016   right breast   COPD, mild (HCC) FOLLOWED BY  [name]   Depression    GERD (gastroesophageal reflux disease)    HTN (hypertension)    Hypertension    Iron deficiency anemia    Long-term current use of steroids SYMBICORT INHALER   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    Overweight (BMI 25.0-29.9) 05/11/2016   Palpitations    Personal history of radiation therapy 2016   Sarcoidosis STABLE PER CXR JUNE 2013   Shortness of breath    Sickle cell trait (HCC)     ALLERGIES:  is allergic to codeine.  MEDICATIONS:  Current Outpatient Medications  Medication Sig Dispense Refill   potassium  chloride SA (KLOR-CON Cabrera) 20 MEQ tablet Take 1 tablet (20 mEq total) by mouth daily. 5 tablet 0   acetaminophen (TYLENOL) 325 MG tablet Take 650 mg by mouth every 6 (six) hours as needed for mild pain or moderate pain.     albuterol (PROVENTIL) (2.5 MG/3ML) 0.083% nebulizer solution Take 3 mLs (2.5 mg total) by nebulization every 6 (six) hours as needed for wheezing or shortness of breath. 75 mL 1   albuterol (VENTOLIN HFA) 108 (90 Base) MCG/ACT inhaler INHALE 2 PUFFS EVERY 6 HOURS AS NEEDED FOR WHEEZING OR SHORTNESS OF BREATH (Patient taking differently: Inhale 2 puffs into the lungs every 6 (six) hours as needed for shortness of breath or wheezing.) 54 g 0   amoxicillin-clavulanate (AUGMENTIN) 875-125 MG tablet Take 1 tablet by mouth 2 (two) times daily for 6 days. 12 tablet 0   aspirin 81 MG chewable tablet Chew 1 tablet (81 mg total) by mouth daily. 30 tablet 0   atorvastatin (LIPITOR) 20 MG tablet TAKE 1 TABLET (20 MG TOTAL) BY MOUTH DAILY. 90 tablet 3   dexamethasone (DECADRON) 4 MG tablet Take 1 tablet (4 mg total) by mouth every 6 (six) hours. 120 tablet 0   fluticasone (FLONASE) 50 MCG/ACT nasal spray USE 2 SPRAYS IN BOTH  NOSTRILS DAILY AS NEEDED  FOR ALLERGIES (Patient taking differently: Place 2 sprays into both nostrils daily as needed for allergies.) 48 g 11   levETIRAcetam (KEPPRA) 750 MG tablet Take 1 tablet (750 mg total) by mouth 2 (two) times daily. 60 tablet 0   lidocaine-prilocaine (EMLA) cream Apply to affected area once (Patient taking differently: Apply 1 Application topically See admin instructions. Apply to affected area once as directed) 30 g 3   metoprolol tartrate (LOPRESSOR) 25 MG tablet Take 1 tablet (25 mg total) by mouth daily.     montelukast (SINGULAIR) 10 MG tablet Take 10 mg by mouth at bedtime.     Multiple Vitamin (MULTIVITAMIN) capsule Take 1 capsule by mouth daily with breakfast.     omeprazole (PRILOSEC) 20 MG capsule TAKE 1 CAPSULE EVERY DAY (Patient taking  differently: Take 20 mg by mouth daily before breakfast.) 90 capsule 1   ondansetron (ZOFRAN) 4 MG tablet Take 1 tablet (4 mg total) by mouth every 6 (six) hours as needed for nausea. 20 tablet 0   OXYGEN Inhale 2-3 L/min into the lungs See admin instructions. Inhale 2-3 L/min into the lungs CONTINUOUSLY; 2 L/min at rest and 3 L/min if exerted     prochlorperazine (COMPAZINE) 10 MG tablet Take 1 tablet (10 mg total) by mouth every 6 (six) hours as needed for nausea or vomiting. 30 tablet 1   sertraline (ZOLOFT) 50 MG tablet TAKE 1 TABLET EVERY DAY (Patient taking differently: Take 50 mg by mouth daily.) 90 tablet 1   STIOLTO RESPIMAT 2.5-2.5 MCG/ACT AERS USE 2 INHALATIONS BY  MOUTH DAILY 12 g 3   No current facility-administered medications for this visit.    SURGICAL HISTORY:  Past Surgical History:  Procedure Laterality Date   ADJUSTABLE SUTURE MANIPULATION  05/22/2012   Procedure: ADJUSTABLE SUTURE MANIPULATION;  Surgeon: Erin Gubler, MD;  Location: [facility];  Service: Ophthalmology;  Laterality: Right;   BREAST LUMPECTOMY Right 2016   BRONCHIAL BIOPSY  07/09/2023   Procedure: BRONCHIAL BIOPSIES;  Surgeon: Erin Peer, MD;  Location[facility] ENDOSCOPY;  Service: Pulmonary;;   BRONCHIAL BRUSHINGS  07/09/2023   Procedure: BRONCHIAL BRUSHINGS;  Surgeon: Erin Peer, MD;  Location: [facility] ENDOSCOPY;  Service: Pulmonary;;   BRONCHIAL NEEDLE ASPIRATION BIOPSY  07/09/2023   Procedure: BRONCHIAL NEEDLE ASPIRATION BIOPSIES;  Surgeon: Erin Peer, MD;  Location: [facility] ENDOSCOPY;  Service: Pulmonary;;   CESAREAN SECTION  1986   W/ BILATERAL TUBAL LIGATION   COLONOSCOPY WITH PROPOFOL N/A 02/21/2019   Procedure: COLONOSCOPY WITH PROPOFOL;  Surgeon: Erin Fredrickson, MD;  Location: [facility] ENDOSCOPY;  Service: Endoscopy;  Laterality: N/A;   EYE SURGERY     IR IMAGING GUIDED PORT INSERTION  08/01/2023   MEDIAN RECTUS REPAIR  05/22/2012   Procedure: MEDIAN RECTUS REPAIR;  Surgeon: Erin Gubler, MD;  Location: [facility];  Service: Ophthalmology;  Laterality: Bilateral;  INFERIOR RECTUS RESECTION WITH ADJUSTIBLE SUTURES RIGHT EYE    POLYPECTOMY  02/21/2019   Procedure: POLYPECTOMY;  Surgeon: Erin Fredrickson, MD;  Location: [facility] ENDOSCOPY;  Service: Endoscopy;;   RADIOACTIVE SEED GUIDED PARTIAL MASTECTOMY WITH AXILLARY SENTINEL LYMPH NODE BIOPSY Right 08/18/2015   Procedure: RADIOACTIVE SEED GUIDED PARTIAL MASTECTOMY WITH AXILLARY SENTINEL LYMPH NODE BIOPSY;  Surgeon: Erin Lint, MD;  Location: [facility];  Service: General;  Laterality: Right;   TOTAL ROBOTIC ASSISTED LAPAROSCOPIC HYSTERECTOMY  12-30-2010   SYMPTOMATIC UTERINE FIBROIDS   UPPER TEETH EXTRACTION'S  1992   VIDEO BRONCHOSCOPY WITH RADIAL ENDOBRONCHIAL ULTRASOUND  07/09/2023   Procedure: VIDEO BRONCHOSCOPY WITH RADIAL ENDOBRONCHIAL ULTRASOUND;  Surgeon: Erin Peer, MD;  Location: [facility] ENDOSCOPY;  Service: Pulmonary;;    REVIEW OF SYSTEMS:   Review of Systems  Constitutional: Positive for stable fatigue. Negative for appetite change, chills, fatigue, fever and unexpected weight change.  HENT:  Negative for mouth sores, nosebleeds, sore throat and trouble swallowing.   Eyes: Negative for eye problems and icterus.  Respiratory: Positive for baseline dyspnea. Negative for cough, hemoptysis, and wheezing.  Cardiovascular: Negative for chest pain and leg swelling.  Gastrointestinal: Negative for abdominal pain, constipation, diarrhea, nausea and vomiting.  Genitourinary: Negative for bladder incontinence, difficulty urinating, dysuria, frequency and hematuria.   Musculoskeletal: Negative for back pain, gait problem, neck pain and neck stiffness.  Skin: Negative for itching and rash.  Neurological: Negative for dizziness, extremity weakness, gait problem, headaches, light-headedness and seizures.  Hematological: Negative for adenopathy. Does not bruise/bleed easily.   Psychiatric/Behavioral: Negative for confusion, depression and sleep disturbance. The patient is not nervous/anxious.     PHYSICAL EXAMINATION:  Blood pressure (!) 142/80, pulse 80, temperature 98 F (36.7 C), temperature source Temporal, resp. rate 14, weight 167 lb 8 oz (76 kg), SpO2 99%.  ECOG PERFORMANCE STATUS: 1  Physical Exam  Constitutional: Oriented to person, place, and time and chronically ill appearing female, and in no distress.  HENT:  Head: Normocephalic and atraumatic.  Mouth/Throat: Oropharynx is clear and moist. No oropharyngeal exudate.  Eyes: Conjunctivae are normal. Right eye exhibits no discharge. Left eye exhibits no  discharge. No scleral icterus.  Neck: Normal range of motion. Neck supple.  Cardiovascular: Normal rate, regular rhythm, normal heart sounds and intact distal pulses.   Pulmonary/Chest: Effort normal and breath sounds normal. No respiratory distress. No wheezes. No rales. On supplemental oxygen.  Abdominal: Soft. Bowel sounds are normal. Exhibits no distension and no mass. There is no tenderness.  Musculoskeletal: Normal range of motion. Exhibits no edema.  Lymphadenopathy:    No cervical adenopathy.  Neurological: Alert and oriented to person, place, and time. Exhibits muscle wasting. She was examined in the wheelchair.  Skin: Skin is warm and dry. No rash noted. Not diaphoretic. No erythema. No pallor.  Psychiatric: Mood, memory and judgment normal.  Vitals reviewed.  LABORATORY DATA: Lab Results  Component Value Date   WBC 10.1 09/27/2023   HGB 10.1 (L) 09/27/2023   HCT 32.6 (L) 09/27/2023   MCV 84.5 09/27/2023   PLT 299 09/27/2023      Chemistry      Component Value Date/Time   NA 148 (H) 09/27/2023 0918   NA 146 (H) 01/10/2019 1220   K 3.2 (L) 09/27/2023 0918   CL 107 09/27/2023 0918   CO2 37 (H) 09/27/2023 0918   BUN 15 09/27/2023 0918   BUN 21 01/10/2019 1220   CREATININE 0.76 09/27/2023 0918   CREATININE 0.64 02/04/2015  1619      Component Value Date/Time   CALCIUM 7.7 (L) 09/27/2023 0918   ALKPHOS 52 09/27/2023 0918   AST 14 (L) 09/27/2023 0918   ALT 20 09/27/2023 0918   BILITOT 0.3 09/27/2023 0918       RADIOGRAPHIC STUDIES:  CT CHEST W CONTRAST  Result Date: 09/19/2023 CLINICAL DATA:  Non-small cell lung cancer staging; * Tracking Code: BO * EXAM: CT CHEST WITH CONTRAST TECHNIQUE: Multidetector CT imaging of the chest was performed during intravenous contrast administration. RADIATION DOSE REDUCTION: This exam was performed according to the departmental dose-optimization program which includes automated exposure control, adjustment of the mA and/or kV according to patient size and/or use of iterative reconstruction technique. CONTRAST:  50mL OMNIPAQUE IOHEXOL 350 MG/ML SOLN COMPARISON:  PET-CT dated August 11, 2023; chest CT dated June 26, 2023 FINDINGS: Cardiovascular: Normal heart size. No pericardial effusion. Normal caliber thoracic aorta with moderate atherosclerotic disease. Mediastinum/Nodes: Esophagus and thyroid are unremarkable. No enlarged lymph nodes seen in the chest. Lungs/Pleura: Right upper lobe mass is decreased in size with new areas of cavitation measuring 4.8 x 3.6 cm, previously 6.3 x 5.1 cm. Stable irregular linear opacities of the bilateral lower lobes, likely due to scarring. New small right pleural effusion. Severe emphysema. Upper Abdomen: Stable cystic lesion of the anterior right kidney. No acute abnormality. Musculoskeletal: No chest wall abnormality. Unchanged diffuse sclerosis of the T3 vertebral body. IMPRESSION: 1. Right upper lobe mass is decreased in size with new areas of cavitation. 2. New small right pleural effusion. 3. Unchanged diffuse sclerosis of the T3 vertebral body. No evidence of progressive metastatic disease. 4. Aortic Atherosclerosis (ICD10-I70.0) and Emphysema (ICD10-J43.9). Electronically Signed   By: [name] Cabrera.D.   On: 09/19/2023 11:11    Overnight EEG with video  Result Date: 09/18/2023 Erin Quest, MD     09/18/2023  8:37 AM Patient Name: [name] MRN: [id number] Epilepsy Attending: Charlsie Cabrera Referring Physician/Provider: Marjorie Smolder, NP Duration: 09/18/2023 0020 to 0830  Patient history:  62 y.o. female with history of stage IV non-small cell lung cancer with mets to the  brain status post stereotactic radiosurgery on 10/28, breast cancer, GERD, depression, COPD, hypertension, anemia, sarcoidosis and sickle cell trait who presents with altered mental status and decreased responsiveness as of this morning. EEG to evaluate for seizure  Level of alertness:  comatose/ lethargic-->awake  AEDs during EEG study: LEV  Technical aspects: This EEG study was done with scalp electrodes positioned according to the 10-20 International system of electrode placement. Electrical activity was reviewed with band pass filter of 1-70Hz , sensitivity of 7 uV/mm, display speed of 84mm/sec with a 60Hz  notched filter applied as appropriate. EEG data were recorded continuously and digitally stored.  Video monitoring was available and reviewed as appropriate.  Description: EEG initially showed lateralized periodic discharges ( LPDs) with overriding fast activity were noted in left hemisphere with fluctuating frequency of 0.5 to 2 Hz, at times with overlying rhythmicity, without definite evolution. EEG also showed continuous generalized 3 to 6 Hz theta- delta slowing admixed with 12 to 15 Hz with activity. Gradually, the morphology of LPDs improved and there was no overriding fast activity.  Additionally the frequency of LPDs improved to 0.25-0.5Hz .  EEG also showed posterior dominant rhythm of 8 Hz activity of moderate voltage (25-35 uV) seen predominantly in posterior head regions, symmetric and reactive to eye opening and eye closing.  Intermittent generalized and lateralized left hemisphere 3-6 [name] delta slowing was also noted.   Hyperventilation and photic stimulation were not performed.    ABNORMALITY - Lateralized periodic discharges ( LPD), left hemisphere - Continuous slow, generalized and lateralized left hemisphere  IMPRESSION: This study initially showed evidence of epileptogenicity arising from left hemisphere with increased risk of seizure recurrence.  Additionally there was moderate diffuse encephalopathy.  Gradually, EEG improved and was suggestive of cortical dysfunction in left hemisphere likely secondary to underlying structural abnormality as well as mild diffuse encephalopathy.  No seizures were seen during the study.  [name]   EEG adult  Result Date: 09/18/2023 Erin Quest, MD     09/18/2023  8:33 AM Patient Name: [name] MRN: [id number] Epilepsy Attending: Charlsie Cabrera Referring Physician/Provider: Alessandra Bevels, MD Date: 09/17/2023 Duration: 26.50 mins Patient history:  62 y.o. 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 EEG to evaluate for seizure Level of alertness:  comatose/ lethargic AEDs during EEG study: LEV Technical aspects: This EEG study was done with scalp electrodes positioned according to the 10-20 International system of electrode placement. Electrical activity was reviewed with band pass filter of 1-70Hz , sensitivity of 7 uV/mm, display speed of 29mm/sec with a 60Hz  notched filter applied as appropriate. EEG data were recorded continuously and digitally stored.  Video monitoring was available and reviewed as appropriate. Description: EEG showed continuous generalized 3 to 6 Hz theta- delta slowing admixed with 12 to 15 Hz with activity.  Lateralized periodic discharges with overriding fast activity were noted  in left hemisphere with fluctuating frequency of 0.5 to 2 Hz, at times with overlying rhythmicity, without definite evolution. Hyperventilation and photic stimulation were not performed.   ABNORMALITY -Lateralized periodic discharges with overriding fast activity ( LPD+F), left hemisphere - Continuous slow, generalized IMPRESSION: This study showed evidence of epileptogenicity arising from left hemisphere.  This EEG pattern is on the ictal-interictal continuum with increased risk for  seizures.  Additionally there is moderate to severe diffuse encephalopathy.  No definite seizures were seen during the study. [name]   MR BRAIN W WO CONTRAST  Result Date: 09/17/2023 CLINICAL DATA:  Altered mental status and seizure. Brain metastases. EXAM: MRI HEAD WITHOUT AND WITH CONTRAST TECHNIQUE: Multiplanar, multiecho pulse sequences of the brain and surrounding structures were obtained without and with intravenous contrast. CONTRAST:  7mL GADAVIST GADOBUTROL 1 MMOL/ML IV SOLN COMPARISON:  08/29/2023 FINDINGS: Brain: There is abnormal diffusion restriction within the medial left PCA territory, including the dorsomedial left thalamus. There is also diffusion restriction of the left insula. There is vasogenic edema within the anterior left temporal lobe and left occipital lobe, near the regions of diffusion restriction. There contrast-enhancing masses at both locations. The left temporal mass measures 10 mm, unchanged (series 19, image 27). Left occipital mass measures 10 mm, unchanged (image 23). Lesion of the right postcentral gyrus measures 7 mm, previously 9 mm (image 48). There is a focus of likely extra-axial contrast enhancement at the left cerebellopontine angle that measures 7 mm, previously 3 mm (series 19, image 18). The midline structures are normal. Vascular: Normal flow voids. Skull and upper cervical spine: Normal calvarium and skull base. Visualized upper cervical spine and soft tissues are normal.  Sinuses/Orbits:No paranasal sinus fluid levels or advanced mucosal thickening. No mastoid or middle ear effusion. Normal orbits. IMPRESSION: 1. Unchanged size of contrast-enhancing masses of the left temporal lobe and left occipital lobe with progression of surrounding vasogenic edema. 2. Areas of diffusion restriction of the left insula and left PCA territory, near the above described lesions, may be a postictal phenomenon. 3. Slight decrease in size of the right postcentral gyrus metastasis. 4. Increased size of a 7 mm extra-axial contrast-enhancing mass at the left cerebellopontine angle, which could be a meningioma or extra-axial metastasis. Electronically Signed   By: [name] Cabrera.D.   On: 09/17/2023 19:51   DG Chest Port 1 View  Result Date: 09/17/2023 CLINICAL DATA:  Altered mental status.  Lung cancer. EXAM: PORTABLE CHEST 1 VIEW COMPARISON:  08/27/2023. FINDINGS: There is a right chest wall port a catheter with tip in the distal SVC. Stable cardiomediastinal contours. The right upper lobe treated lung mass is again noted with signs of central cavitary change. There is asymmetric right lung volume loss with increase opacification in the right lower lung compared with the previous exam. Left lung appears clear. IMPRESSION: 1. Increased opacification in the right lower lung compared with the previous exam. Atelectasis and or pleural effusion. Underlying pneumonia would be difficult to exclude. 2. Stable appearance of right upper lobe lung mass with central cavitation. Electronically Signed   By: [name] Cabrera.D.   On: 09/17/2023 15:41   CT HEAD WO CONTRAST  Result Date: 09/17/2023 CLINICAL DATA:  Mental status change, unknown cause EXAM: CT HEAD WITHOUT CONTRAST TECHNIQUE: Contiguous axial images were obtained from the base of the skull through the vertex without intravenous contrast. RADIATION DOSE REDUCTION: This exam was performed according to the departmental dose-optimization program which  includes automated exposure control, adjustment of the mA and/or kV according to patient size and/or use of iterative reconstruction technique. COMPARISON:  Brain MR 08/29/23 FINDINGS: Brain: No hemorrhage. No hydrocephalus. No extra-axial fluid collection. No CT evidence of an acute cortical infarct. 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 There is no definite evidence of new metastatic  disease on this noncontrast enhanced exam. Vascular: No hyperdense vessel or unexpected calcification. Skull: Normal. Negative for fracture or focal lesion. Sinuses/Orbits: No acute finding. Other: None. IMPRESSION: 1. No CT evidence of an acute cortical infarct or intracranial hemorrhage. 2. Redemonstrated vasogenic edema at sites of known metastatic disease in the anterior left temporal lobe and in the left occipital lobe. There is no definite evidence of new metastatic disease on this noncontrast enhanced exam. If more detailed characterization for new metastatic disease is clinically warranted, further evaluation with repeat brain MRI recommended. Electronically Signed   By: [name] Cabrera.D.   On: 09/17/2023 14:59   MR BRAIN W WO CONTRAST  Result Date: 08/30/2023 CLINICAL DATA:  Metastatic disease evaluation EXAM: MRI HEAD WITHOUT AND WITH CONTRAST TECHNIQUE: Multiplanar, multiecho pulse sequences of the brain and surrounding structures were obtained without and with intravenous contrast. CONTRAST:  7.99mL GADAVIST GADOBUTROL 1 MMOL/ML IV SOLN COMPARISON:  07/20/2023 FINDINGS: Brain: Redemonstrated peripherally enhancing lesion in the right postcentral gyrus, which now measures up to 9 x 9 mm (series 16, image 127), previously 13 x 12 mm slightly decreased associated edema. New peripherally enhancing lesion in the left temporal lobe, which measures up to 10 x 8 mm (series 16, image 56). Additional peripherally enhancing lesion  in the left occipital lobe measures up to 10 x 9 mm (series 16, image 56). The lesions are associated with moderate surrounding edema. No evidence of acute infarct chin, hemorrhage, mass effect, or midline shift. No hydrocephalus or extra-axial collection. Vascular: Normal arterial flow voids. Normal arterial and venous enhancement. Skull and upper cervical spine: Normal marrow signal. Sinuses/Orbits: Clear paranasal sinuses. Status post bilateral lens replacements. Other: The mastoids are well aerated. IMPRESSION: 1. New peripherally enhancing lesions in the left temporal and occipital lobes, consistent with new metastatic disease. 2. Slightly decrease in the size of a previously noted right postcentral gyrus lesion, with decreased associated edema. Electronically Signed   By: [name] Cabrera.D.   On: 08/30/2023 04:19     ASSESSMENT/PLAN:  This is a very pleasant 62 year old African-American female with stage IV (T3, N0, M1 C) non-small cell lung cancer, squamous cell carcinoma.  She presented with a right upper lobe lung mass in addition to a solitary brain metastasis and a T1 bone lesion.  She was diagnosed in September 2024. Her PDL1 expression is 20%.    The patient underwent SRS to the solitary brain lesion and is scheduled to complete radiation to T1 in the right upper lobe lung mass  on 08/27/2023.   She unfortunately developed two new brain lesions since her September 2024 MRI. She underwent SRS on 09/06/23 by Erin. [name].   She is currently on Decadron.  She is expected to follow-up with Erin. [name].  She is also on Keppra.  The patient was seen with Erin. Arbutus Ped today.  Erin. Arbutus Ped rediscussed that her condition is treatable but not curable.  The intent of treatment would be to prolong her life and control her cancer as long as possible.  Of course it is always an option to pursue palliative care and hospice in the setting of stage IV cancer.  She would undergo chemotherapy and immunotherapy  for the first 12 weeks followed by maintenance immunotherapy every 3 weeks which is more tolerable.  After discussion the patient would like to try 1 cycle of treatment.  She is scheduled for her first cycle of treatment tomorrow.  We will see her back for a 1 week  follow-up visit next week with labs.  Her cath.  She has her antiemetics at home.  I sent her potassium to take 1 tablet daily for 5 days.  She was also instructed to increase her calcium intake.  She was instructed to take Claritin daily for 4 to 7 days after Neulasta injection for possible myalgias and arthralgias.  I reprinted out information about her treatment to share with her daughters.  She will continue taking Decadron and following with neuro-oncology and radiation oncology.  Consider blood transfusion if her hemoglobin were less than 8.  The patient was advised to call immediately if she has any concerning symptoms in the interval. The patient voices understanding of current disease status and treatment options and is in agreement with the current care plan. All questions were answered. The patient knows to call the clinic with any problems, questions or concerns. We can certainly see the patient much sooner if necessary   No orders of the defined types were placed in this encounter.    Calen Geister L Mieshia Pepitone, PA-C 09/27/23  ADDENDUM: Hematology/Oncology Attending: I had a face-to-face encounter with the patient.  I reviewed her records, lab, scans and recommended her care plan.  This is a very pleasant 62 years old African-American female with a stage IV non-small cell lung cancer, squamous cell carcinoma presented with right upper lobe lung mass in addition to brain and bone metastasis diagnosed in August 2024.  The patient is status post SRS to brain metastasis on July 23, 2023 and September 06, 2023 as well as SBRT to the right upper lobe lung mass and the solitary osseous metastasis at T1.  The patient is here  today for evaluation with 2 family member.  She is feeling fine.  She was reluctant to consider systemic therapy at some point. I had a lengthy discussion with the patient and her family about her current condition and treatment options.  I discussed with the patient the option of palliative care and hospice referral versus consideration of palliative systemic chemotherapy with 4 cycles of carboplatin, paclitaxel and Keytruda followed by maintenance treatment with single agent Keytruda after the first cycle if she has no evidence for disease progression. I reminded the patient of the adverse effect of this treatment including but not limited to alopecia, myelosuppression, nausea and vomiting, peripheral neuropathy, liver or renal dysfunction. The patient decided to proceed with the treatment and try it at least once to see how she tolerated. She is expected to start the first cycle of this treatment tomorrow.  She will come back for follow-up visit in 1 week for evaluation and management of any adverse effect of her treatment. She was advised to call immediately if she has any other concerning symptoms in the interval. The total time spent in the appointment was 30 minutes. Disclaimer: This note was dictated with voice recognition software. Similar sounding words can inadvertently be transcribed and may be missed upon review.

## 2023-09-26 ENCOUNTER — Inpatient Hospital Stay: Payer: 59 | Attending: Internal Medicine

## 2023-09-26 ENCOUNTER — Inpatient Hospital Stay: Payer: 59

## 2023-09-26 ENCOUNTER — Telehealth: Payer: Self-pay | Admitting: *Deleted

## 2023-09-26 DIAGNOSIS — C7931 Secondary malignant neoplasm of brain: Secondary | ICD-10-CM | POA: Insufficient documentation

## 2023-09-26 DIAGNOSIS — Z5111 Encounter for antineoplastic chemotherapy: Secondary | ICD-10-CM | POA: Insufficient documentation

## 2023-09-26 DIAGNOSIS — C3411 Malignant neoplasm of upper lobe, right bronchus or lung: Secondary | ICD-10-CM | POA: Insufficient documentation

## 2023-09-26 DIAGNOSIS — Z5189 Encounter for other specified aftercare: Secondary | ICD-10-CM | POA: Insufficient documentation

## 2023-09-26 DIAGNOSIS — Z5112 Encounter for antineoplastic immunotherapy: Secondary | ICD-10-CM | POA: Insufficient documentation

## 2023-09-26 DIAGNOSIS — C7951 Secondary malignant neoplasm of bone: Secondary | ICD-10-CM | POA: Insufficient documentation

## 2023-09-26 DIAGNOSIS — Z923 Personal history of irradiation: Secondary | ICD-10-CM | POA: Insufficient documentation

## 2023-09-26 DIAGNOSIS — Z7982 Long term (current) use of aspirin: Secondary | ICD-10-CM | POA: Insufficient documentation

## 2023-09-26 DIAGNOSIS — Z7952 Long term (current) use of systemic steroids: Secondary | ICD-10-CM | POA: Insufficient documentation

## 2023-09-26 DIAGNOSIS — Z79899 Other long term (current) drug therapy: Secondary | ICD-10-CM | POA: Insufficient documentation

## 2023-09-26 NOTE — Telephone Encounter (Signed)
Patient's daughter [name] called office: Patient has decided not to go through with chemotherapy. She has appointments tomorrow for labs and to see C. Heilingoetter, PA.  She asked what are the next steps.Do they need to make appointment to discuss with Dr. Arbutus Ped?  Ms. Heilingoetter informed. She asked that patient be encouraged to keep appts tomorrow and can discuss at that time. Contacted daughter with Ms. Heilingoetter's recommendation. [name] said they will come tomorrow

## 2023-09-27 ENCOUNTER — Encounter: Payer: Self-pay | Admitting: Internal Medicine

## 2023-09-27 ENCOUNTER — Inpatient Hospital Stay: Payer: 59

## 2023-09-27 ENCOUNTER — Inpatient Hospital Stay (HOSPITAL_BASED_OUTPATIENT_CLINIC_OR_DEPARTMENT_OTHER): Payer: 59 | Admitting: Physician Assistant

## 2023-09-27 VITALS — BP 142/80 | HR 80 | Temp 98.0°F | Resp 14 | Wt 167.5 lb

## 2023-09-27 DIAGNOSIS — C3490 Malignant neoplasm of unspecified part of unspecified bronchus or lung: Secondary | ICD-10-CM

## 2023-09-27 DIAGNOSIS — Z5112 Encounter for antineoplastic immunotherapy: Secondary | ICD-10-CM | POA: Diagnosis present

## 2023-09-27 DIAGNOSIS — Z95828 Presence of other vascular implants and grafts: Secondary | ICD-10-CM

## 2023-09-27 DIAGNOSIS — Z7982 Long term (current) use of aspirin: Secondary | ICD-10-CM | POA: Diagnosis not present

## 2023-09-27 DIAGNOSIS — E876 Hypokalemia: Secondary | ICD-10-CM

## 2023-09-27 DIAGNOSIS — Z7189 Other specified counseling: Secondary | ICD-10-CM | POA: Diagnosis not present

## 2023-09-27 DIAGNOSIS — Z79899 Other long term (current) drug therapy: Secondary | ICD-10-CM | POA: Diagnosis not present

## 2023-09-27 DIAGNOSIS — Z923 Personal history of irradiation: Secondary | ICD-10-CM | POA: Diagnosis not present

## 2023-09-27 DIAGNOSIS — Z5189 Encounter for other specified aftercare: Secondary | ICD-10-CM | POA: Diagnosis not present

## 2023-09-27 DIAGNOSIS — Z7952 Long term (current) use of systemic steroids: Secondary | ICD-10-CM | POA: Diagnosis not present

## 2023-09-27 DIAGNOSIS — C3491 Malignant neoplasm of unspecified part of right bronchus or lung: Secondary | ICD-10-CM | POA: Diagnosis not present

## 2023-09-27 DIAGNOSIS — C3411 Malignant neoplasm of upper lobe, right bronchus or lung: Secondary | ICD-10-CM | POA: Diagnosis present

## 2023-09-27 DIAGNOSIS — Z5111 Encounter for antineoplastic chemotherapy: Secondary | ICD-10-CM | POA: Diagnosis present

## 2023-09-27 DIAGNOSIS — C7951 Secondary malignant neoplasm of bone: Secondary | ICD-10-CM | POA: Diagnosis not present

## 2023-09-27 DIAGNOSIS — C7931 Secondary malignant neoplasm of brain: Secondary | ICD-10-CM | POA: Diagnosis not present

## 2023-09-27 LAB — CBC WITH DIFFERENTIAL (CANCER CENTER ONLY)
Abs Immature Granulocytes: 0.11 10*3/uL — ABNORMAL HIGH (ref 0.00–0.07)
Basophils Absolute: 0 10*3/uL (ref 0.0–0.1)
Basophils Relative: 0 %
Eosinophils Absolute: 0 10*3/uL (ref 0.0–0.5)
Eosinophils Relative: 0 %
HCT: 32.6 % — ABNORMAL LOW (ref 36.0–46.0)
Hemoglobin: 10.1 g/dL — ABNORMAL LOW (ref 12.0–15.0)
Immature Granulocytes: 1 %
Lymphocytes Relative: 1 %
Lymphs Abs: 0.1 10*3/uL — ABNORMAL LOW (ref 0.7–4.0)
MCH: 26.2 pg (ref 26.0–34.0)
MCHC: 31 g/dL (ref 30.0–36.0)
MCV: 84.5 fL (ref 80.0–100.0)
Monocytes Absolute: 0.4 10*3/uL (ref 0.1–1.0)
Monocytes Relative: 4 %
Neutro Abs: 9.5 10*3/uL — ABNORMAL HIGH (ref 1.7–7.7)
Neutrophils Relative %: 94 %
Platelet Count: 299 10*3/uL (ref 150–400)
RBC: 3.86 MIL/uL — ABNORMAL LOW (ref 3.87–5.11)
RDW: 18 % — ABNORMAL HIGH (ref 11.5–15.5)
WBC Count: 10.1 10*3/uL (ref 4.0–10.5)
nRBC: 0 % (ref 0.0–0.2)

## 2023-09-27 LAB — CMP (CANCER CENTER ONLY)
ALT: 20 U/L (ref 0–44)
AST: 14 U/L — ABNORMAL LOW (ref 15–41)
Albumin: 3.2 g/dL — ABNORMAL LOW (ref 3.5–5.0)
Alkaline Phosphatase: 52 U/L (ref 38–126)
Anion gap: 4 — ABNORMAL LOW (ref 5–15)
BUN: 15 mg/dL (ref 8–23)
CO2: 37 mmol/L — ABNORMAL HIGH (ref 22–32)
Calcium: 7.7 mg/dL — ABNORMAL LOW (ref 8.9–10.3)
Chloride: 107 mmol/L (ref 98–111)
Creatinine: 0.76 mg/dL (ref 0.44–1.00)
GFR, Estimated: >60 mL/min (ref >60)
Glucose, Bld: 151 mg/dL — ABNORMAL HIGH (ref 70–99)
Potassium: 3.2 mmol/L — ABNORMAL LOW (ref 3.5–5.1)
Sodium: 148 mmol/L — ABNORMAL HIGH (ref 135–145)
Total Bilirubin: 0.3 mg/dL (ref <1.2)
Total Protein: 5.4 g/dL — ABNORMAL LOW (ref 6.5–8.1)

## 2023-09-27 LAB — TSH: TSH: 0.558 u[IU]/mL (ref 0.350–4.500)

## 2023-09-27 MED ORDER — POTASSIUM CHLORIDE CRYS ER 20 MEQ PO TBCR: 20.0000 meq | EXTENDED_RELEASE_TABLET | Freq: Every day | ORAL | 0 refills | Status: DC

## 2023-09-27 MED ORDER — SODIUM CHLORIDE 0.9% FLUSH
10.0000 mL | Freq: Once | INTRAVENOUS | Status: AC
  Administered 2023-09-27: 10 mL

## 2023-09-27 MED ORDER — HEPARIN SOD (PORK) LOCK FLUSH 100 UNIT/ML IV SOLN
500.0000 [IU] | Freq: Once | INTRAVENOUS | Status: AC
  Administered 2023-09-27: 500 [IU]

## 2023-09-27 MED FILL — Fosaprepitant Dimeglumine For IV Infusion 150 MG (Base Eq): INTRAVENOUS | Qty: 5 | Status: AC

## 2023-09-27 NOTE — Patient Instructions (Signed)
-  There are two main categories of lung cancer, they are named based on the size of the cancer cell. One is called Non-Small cell lung cancer. The other type is Small Cell Lung Cancer -The sample (biopsy) that they took of your tumor was consistent with a subtype of Non-small cell lung cancer called Squamous Cell Carcinoma.  -We covered a lot of important information at your appointment today regarding what the treatment plan is moving forward. Here are the the main points that were discussed at your office visit with [place] today:  -The treatment that you will receive consists of two chemotherapy drugs, called Carboplatin and Paclitaxel (also referred to as Taxol) and one immunotherapy drug called Keytruda (pembrolizumab).  -We are planning on starting your treatment next week on _/_/_ but before your start your treatment, -Your treatment will be given once every 3 weeks. We will check your labs once a week for the first ~5 treatments just to make sure that important components of your blood are in an acceptable range -You will need to return 2 days after your treatment to receive an injection. This injection is important because it boosts your infection fighting cells in your body and helps protect you from getting an infection.  -We will get a CT scan after 3 treatments to check on the progress of treatment  Medications:  -Compazine was sent to your pharmacy. This medication is for nausea. You may take this every 6 hours as needed if you feel nausous.   Referrals or Imaging:   Follow up:  -We will see you back for a follow up visit in about  __ to see how your first treatment went and to make sure you are not having any side effects from treatment.   If you need to contact our office, please do not hesitate, we are here to help. Our number is [phone]. When you call, ask to speak to Cassie's or Dr. Asa Lente nurse.

## 2023-09-28 ENCOUNTER — Other Ambulatory Visit: Payer: Self-pay

## 2023-09-28 ENCOUNTER — Inpatient Hospital Stay: Payer: 59

## 2023-09-28 ENCOUNTER — Encounter: Payer: Self-pay | Admitting: Internal Medicine

## 2023-09-28 ENCOUNTER — Encounter: Payer: Self-pay | Admitting: Hematology and Oncology

## 2023-09-28 LAB — T4: T4, Total: 5.4 ug/dL (ref 4.5–12.0)

## 2023-09-30 NOTE — Progress Notes (Unsigned)
[facility] OFFICE PROGRESS NOTE  [name]., FNP [address] [place] [address]  DIAGNOSIS: Stage IV (T3, N0, M1C) non-small cell lung cancer, squamous cell carcinoma presented with right upper lobe lung mass in addition to solitary brain metastasis and T1 bone metastasis. She was diagnosed in August 2023   PDL1 Expression: 20%  PRIOR THERAPY: 1) SRS to the metastatic brain lesion on 07/23/2023 under the care of Dr. [name]. 2) SBRT to the right upper lobe lung mass and SBRT to the solitary osseous metastasis at T1. Last dose on 08/27/23 3) SRS to the new left temporal and occipital lobe metastatic brain lesion under the care of Dr. [name], expected on 09/06/23  CURRENT THERAPY: 1) palliative systemic chemoimmunotherapy with carboplatin for AUC of 5, paclitaxel 175 Mg/M2 and Keytruda 200 Mg IV every 3 weeks with Neulasta support. First dose expected on 10/04/23  INTERVAL HISTORY: [name] 62 y.o. female returns to the clinic today for follow-up visit unaccompanied.  The patient was supposed to return to the clinic today for 1 week follow-up visit to manage any adverse side effects of treatment.  She was supposed undergo her first cycle of treatment on 09/28/2023.  It appears that the patient did not show up for treatment that day.  The patient does have some memory concerns and does not remember if she received treatment last week or not. She states that she has a poor memory.   In summary, the patient has stage IV lung cancer.  She was found to have metastatic disease to the brain and underwent SBRT, the most recent being on 09/06/2023.  She was supposed to start systemic chemotherapy in early November but presented to the emergency room with altered mental status.  Her family found her slumped over the bed unresponsive and with associated left-sided weakness.  A CT of the head/MRI of the brain showed the known treated metastatic disease to the brain with  surrounding edema and she was started on IV Decadron.  Radiation oncology evaluated the patient while in the hospital who recommended 4 mg of Decadron 4 times a day.  She also was put on Keppra for seizure prophylaxis and she is referred to Dr. [name] has an appointment on 11/26.  She was discharged home with home health PT and RN , a wheelchair, and a hospital bed.   The patient and her family went back and forth between pursuing hospice vs trying one cycle of treatment. When she was seen last week, she decided to pursue one cycle of treatment.  The patient is presently not driving but declined referral to social work for any transportation assistance.  She states between her family members that someone will drive her to her appointments.  She denies any changes in her health since she was last seen on 09/27/2023. She denies any fever, chills, or night sweats.  She has a good appetite.  She reports her baseline dyspnea on exertion is about the same for which she is on 2 L of supplemental oxygen.  If she is exerting herself she may increase her oxygen to 3 to 4 L.  Denies any cough, chest pain, or hemoptysis.  She denies any nausea or vomiting.  She denies any diarrhea or constipation.  Denies any headaches or vision changes.   MEDICAL HISTORY: Past Medical History:  Diagnosis Date   Acute on chronic respiratory failure (HCC) 10/09/2016   Allergic rhinitis    Breast cancer (HCC) 2016  right breast   COPD, mild (HCC) FOLLOWED BY DR ZOXW   Depression    GERD (gastroesophageal reflux disease)    HTN (hypertension)    Hypertension    Iron deficiency anemia    Long-term current use of steroids SYMBICORT INHALER   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    Overweight (BMI 25.0-29.9) 05/11/2016   Palpitations    Personal history of radiation therapy 2016   Sarcoidosis STABLE PER CXR JUNE 2013   Shortness of breath    Sickle cell trait (HCC)     ALLERGIES:  is allergic to codeine.  MEDICATIONS:  Current Outpatient  Medications  Medication Sig Dispense Refill   acetaminophen (TYLENOL) 325 MG tablet Take 650 mg by mouth every 6 (six) hours as needed for mild pain or moderate pain.     albuterol (PROVENTIL) (2.5 MG/3ML) 0.083% nebulizer solution Take 3 mLs (2.5 mg total) by nebulization every 6 (six) hours as needed for wheezing or shortness of breath. 75 mL 1   albuterol (VENTOLIN HFA) 108 (90 Base) MCG/ACT inhaler INHALE 2 PUFFS EVERY 6 HOURS AS NEEDED FOR WHEEZING OR SHORTNESS OF BREATH (Patient taking differently: Inhale 2 puffs into the lungs every 6 (six) hours as needed for shortness of breath or wheezing.) 54 g 0   aspirin 81 MG chewable tablet Chew 1 tablet (81 mg total) by mouth daily. 30 tablet 0   atorvastatin (LIPITOR) 20 MG tablet TAKE 1 TABLET (20 MG TOTAL) BY MOUTH DAILY. 90 tablet 3   dexamethasone (DECADRON) 4 MG tablet Take 1 tablet (4 mg total) by mouth every 6 (six) hours. 120 tablet 0   fluticasone (FLONASE) 50 MCG/ACT nasal spray USE 2 SPRAYS IN BOTH  NOSTRILS DAILY AS NEEDED  FOR ALLERGIES (Patient taking differently: Place 2 sprays into both nostrils daily as needed for allergies.) 48 g 11   levETIRAcetam (KEPPRA) 750 MG tablet Take 1 tablet (750 mg total) by mouth 2 (two) times daily. 60 tablet 0   lidocaine-prilocaine (EMLA) cream Apply to affected area once (Patient taking differently: Apply 1 Application topically See admin instructions. Apply to affected area once as directed) 30 g 3   metoprolol tartrate (LOPRESSOR) 25 MG tablet Take 1 tablet (25 mg total) by mouth daily.     montelukast (SINGULAIR) 10 MG tablet Take 10 mg by mouth at bedtime.     Multiple Vitamin (MULTIVITAMIN) capsule Take 1 capsule by mouth daily with breakfast.     omeprazole (PRILOSEC) 20 MG capsule TAKE 1 CAPSULE EVERY DAY (Patient taking differently: Take 20 mg by mouth daily before breakfast.) 90 capsule 1   ondansetron (ZOFRAN) 4 MG tablet Take 1 tablet (4 mg total) by mouth every 6 (six) hours as needed for  nausea. 20 tablet 0   OXYGEN Inhale 2-3 L/min into the lungs See admin instructions. Inhale 2-3 L/min into the lungs CONTINUOUSLY; 2 L/min at rest and 3 L/min if exerted     potassium chloride SA (KLOR-CON M) 20 MEQ tablet Take 1 tablet (20 mEq total) by mouth daily. 5 tablet 0   prochlorperazine (COMPAZINE) 10 MG tablet Take 1 tablet (10 mg total) by mouth every 6 (six) hours as needed for nausea or vomiting. 30 tablet 1   sertraline (ZOLOFT) 50 MG tablet TAKE 1 TABLET EVERY DAY (Patient taking differently: Take 50 mg by mouth daily.) 90 tablet 1   STIOLTO RESPIMAT 2.5-2.5 MCG/ACT AERS USE 2 INHALATIONS BY MOUTH DAILY 12 g 3   No current facility-administered medications for this  visit.    SURGICAL HISTORY:  Past Surgical History:  Procedure Laterality Date   ADJUSTABLE SUTURE MANIPULATION  05/22/2012   Procedure: ADJUSTABLE SUTURE MANIPULATION;  Surgeon: Corinda Gubler, MD;  Location: [facility];  Service: Ophthalmology;  Laterality: Right;   BREAST LUMPECTOMY Right 2016   BRONCHIAL BIOPSY  07/09/2023   Procedure: BRONCHIAL BIOPSIES;  Surgeon: Leslye Peer, MD;  Location: [facility] ENDOSCOPY;  Service: Pulmonary;;   BRONCHIAL BRUSHINGS  07/09/2023   Procedure: BRONCHIAL BRUSHINGS;  Surgeon: Leslye Peer, MD;  Location: [facility] ENDOSCOPY;  Service: Pulmonary;;   BRONCHIAL NEEDLE ASPIRATION BIOPSY  07/09/2023   Procedure: BRONCHIAL NEEDLE ASPIRATION BIOPSIES;  Surgeon: Leslye Peer, MD;  Location: [facility] ENDOSCOPY;  Service: Pulmonary;;   CESAREAN SECTION  1986   W/ BILATERAL TUBAL LIGATION   COLONOSCOPY WITH PROPOFOL N/A 02/21/2019   Procedure: COLONOSCOPY WITH PROPOFOL;  Surgeon: Hilarie Fredrickson, MD;  Location: [facility] ENDOSCOPY;  Service: Endoscopy;  Laterality: N/A;   EYE SURGERY     IR IMAGING GUIDED PORT INSERTION  08/01/2023   MEDIAN RECTUS REPAIR  05/22/2012   Procedure: MEDIAN RECTUS REPAIR;  Surgeon: Corinda Gubler, MD;  Location: [facility];  Service:  Ophthalmology;  Laterality: Bilateral;  INFERIOR RECTUS RESECTION WITH ADJUSTIBLE SUTURES RIGHT EYE    POLYPECTOMY  02/21/2019   Procedure: POLYPECTOMY;  Surgeon: Hilarie Fredrickson, MD;  Location: [facility] ENDOSCOPY;  Service: Endoscopy;;   RADIOACTIVE SEED GUIDED PARTIAL MASTECTOMY WITH AXILLARY SENTINEL LYMPH NODE BIOPSY Right 08/18/2015   Procedure: RADIOACTIVE SEED GUIDED PARTIAL MASTECTOMY WITH AXILLARY SENTINEL LYMPH NODE BIOPSY;  Surgeon: Almond Lint, MD;  Location: [facility];  Service: General;  Laterality: Right;   TOTAL ROBOTIC ASSISTED LAPAROSCOPIC HYSTERECTOMY  12-30-2010   SYMPTOMATIC UTERINE FIBROIDS   UPPER TEETH EXTRACTION'S  1992   VIDEO BRONCHOSCOPY WITH RADIAL ENDOBRONCHIAL ULTRASOUND  07/09/2023   Procedure: VIDEO BRONCHOSCOPY WITH RADIAL ENDOBRONCHIAL ULTRASOUND;  Surgeon: Leslye Peer, MD;  Location: [facility] ENDOSCOPY;  Service: Pulmonary;;    REVIEW OF SYSTEMS:   Constitutional: Positive for stable fatigue. Negative for appetite change, chills, fatigue, fever and unexpected weight change.  HENT:  Negative for mouth sores, nosebleeds, sore throat and trouble swallowing.   Eyes: Negative for eye problems and icterus.  Respiratory: Positive for baseline dyspnea. Negative for cough, hemoptysis, and wheezing.  Cardiovascular: Negative for chest pain and leg swelling.  Gastrointestinal: Negative for abdominal pain, constipation, diarrhea, nausea and vomiting.  Genitourinary: Negative for bladder incontinence, difficulty urinating, dysuria, frequency and hematuria.   Musculoskeletal: Negative for back pain, gait problem, neck pain and neck stiffness.  Skin: Negative for itching and rash.  Neurological: Negative for dizziness, extremity weakness, gait problem, headaches, light-headedness and seizures.  Hematological: Negative for adenopathy. Does not bruise/bleed easily.  Psychiatric/Behavioral: Positive for memory issues. Negative for confusion, depression and sleep  disturbance. The patient is not nervous/anxious.    PHYSICAL EXAMINATION:  Blood pressure (!) 149/85, pulse 65, temperature 97.8 F (36.6 C), temperature source Temporal, resp. rate 13, weight 170 lb (77.1 kg), SpO2 98%.  ECOG PERFORMANCE STATUS: 2  Physical Exam  Constitutional: Oriented to person, place, and time and well-developed, well-nourished, and in no distress. No distress.  HENT:  Head: Normocephalic and atraumatic.  Mouth/Throat: Oropharynx is clear and moist. No oropharyngeal exudate.  Eyes: Conjunctivae are normal. Right eye exhibits no discharge. Left eye exhibits no discharge. No scleral icterus.  Neck: Normal range of motion. Neck supple.  Cardiovascular: Normal rate,  regular rhythm, normal heart sounds and intact distal pulses.   Pulmonary/Chest: Effort normal and breath sounds normal. No respiratory distress. No wheezes. No rales.  Abdominal: Soft. Bowel sounds are normal. Exhibits no distension and no mass. There is no tenderness.  Musculoskeletal: Normal range of motion. Exhibits no edema.  Lymphadenopathy:    No cervical adenopathy.  Neurological: Alert and oriented to person, place, and time. Exhibits normal muscle tone. Gait normal. Coordination normal.  Skin: Skin is warm and dry. No rash noted. Not diaphoretic. No erythema. No pallor.  Psychiatric: Positive for memory issues. Mood and judgment normal.  Vitals reviewed.  LABORATORY DATA: Lab Results  Component Value Date   WBC 12.0 (H) 10/02/2023   HGB 9.5 (L) 10/02/2023   HCT 29.9 (L) 10/02/2023   MCV 84.7 10/02/2023   PLT 189 10/02/2023      Chemistry      Component Value Date/Time   NA 147 (H) 10/02/2023 1033   NA 146 (H) 01/10/2019 1220   K 3.4 (L) 10/02/2023 1033   CL 106 10/02/2023 1033   CO2 39 (H) 10/02/2023 1033   BUN 12 10/02/2023 1033   BUN 21 01/10/2019 1220   CREATININE 0.64 10/02/2023 1033   CREATININE 0.64 02/04/2015 1619      Component Value Date/Time   CALCIUM 8.1 (L)  10/02/2023 1033   ALKPHOS 59 10/02/2023 1033   AST 16 10/02/2023 1033   ALT 19 10/02/2023 1033   BILITOT 0.5 10/02/2023 1033       RADIOGRAPHIC STUDIES:  CT CHEST W CONTRAST  Result Date: 09/19/2023 CLINICAL DATA:  Non-small cell lung cancer staging; * Tracking Code: BO * EXAM: CT CHEST WITH CONTRAST TECHNIQUE: Multidetector CT imaging of the chest was performed during intravenous contrast administration. RADIATION DOSE REDUCTION: This exam was performed according to the departmental dose-optimization program which includes automated exposure control, adjustment of the mA and/or kV according to patient size and/or use of iterative reconstruction technique. CONTRAST:  50mL OMNIPAQUE IOHEXOL 350 MG/ML SOLN COMPARISON:  PET-CT dated August 11, 2023; chest CT dated June 26, 2023 FINDINGS: Cardiovascular: Normal heart size. No pericardial effusion. Normal caliber thoracic aorta with moderate atherosclerotic disease. Mediastinum/Nodes: Esophagus and thyroid are unremarkable. No enlarged lymph nodes seen in the chest. Lungs/Pleura: Right upper lobe mass is decreased in size with new areas of cavitation measuring 4.8 x 3.6 cm, previously 6.3 x 5.1 cm. Stable irregular linear opacities of the bilateral lower lobes, likely due to scarring. New small right pleural effusion. Severe emphysema. Upper Abdomen: Stable cystic lesion of the anterior right kidney. No acute abnormality. Musculoskeletal: No chest wall abnormality. Unchanged diffuse sclerosis of the T3 vertebral body. IMPRESSION: 1. Right upper lobe mass is decreased in size with new areas of cavitation. 2. New small right pleural effusion. 3. Unchanged diffuse sclerosis of the T3 vertebral body. No evidence of progressive metastatic disease. 4. Aortic Atherosclerosis (ICD10-I70.0) and Emphysema (ICD10-J43.9). Electronically Signed   By: Allegra Lai M.D.   On: 09/19/2023 11:11   Overnight EEG with video  Result Date: 09/18/2023 Charlsie Quest, MD     09/18/2023  8:37 AM Patient Name: PANHIA KREWSON MRN: [id number] Epilepsy Attending: Charlsie Quest Referring Physician/Provider: Marjorie Smolder, NP Duration: 09/18/2023 0020 to 0830  Patient history:  62 y.o. female with history of stage IV non-small cell lung cancer with mets to the brain status post stereotactic radiosurgery on 10/28, breast cancer, GERD, depression, COPD, hypertension, anemia, sarcoidosis and sickle  cell trait who presents with altered mental status and decreased responsiveness as of this morning. EEG to evaluate for seizure  Level of alertness:  comatose/ lethargic-->awake  AEDs during EEG study: LEV  Technical aspects: This EEG study was done with scalp electrodes positioned according to the 10-20 International system of electrode placement. Electrical activity was reviewed with band pass filter of 1-70Hz , sensitivity of 7 uV/mm, display speed of 60mm/sec with a 60Hz  notched filter applied as appropriate. EEG data were recorded continuously and digitally stored.  Video monitoring was available and reviewed as appropriate.  Description: EEG initially showed lateralized periodic discharges ( LPDs) with overriding fast activity were noted in left hemisphere with fluctuating frequency of 0.5 to 2 Hz, at times with overlying rhythmicity, without definite evolution. EEG also showed continuous generalized 3 to 6 Hz theta- delta slowing admixed with 12 to 15 Hz with activity. Gradually, the morphology of LPDs improved and there was no overriding fast activity.  Additionally the frequency of LPDs improved to 0.25-0.5Hz .  EEG also showed posterior dominant rhythm of 8 Hz activity of moderate voltage (25-35 uV) seen predominantly in posterior head regions, symmetric and reactive to eye opening and eye closing.  Intermittent generalized and lateralized left hemisphere 3-6 Esti Deppen delta slowing was also noted.  Hyperventilation and photic stimulation were not performed.    ABNORMALITY  - Lateralized periodic discharges ( LPD), left hemisphere - Continuous slow, generalized and lateralized left hemisphere  IMPRESSION: This study initially showed evidence of epileptogenicity arising from left hemisphere with increased risk of seizure recurrence.  Additionally there was moderate diffuse encephalopathy.  Gradually, EEG improved and was suggestive of cortical dysfunction in left hemisphere likely secondary to underlying structural abnormality as well as mild diffuse encephalopathy.  No seizures were seen during the study.  Charlsie Quest   EEG adult  Result Date: 09/18/2023 Charlsie Quest, MD     09/18/2023  8:33 AM Patient Name: SUSANN POKORSKI MRN: [id number] Epilepsy Attending: Charlsie Quest Referring Physician/Provider: Alessandra Bevels, MD Date: 09/17/2023 Duration: 26.50 mins Patient history:  62 y.o. 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 EEG to evaluate for seizure Level of alertness:  comatose/ lethargic AEDs during EEG study: LEV Technical aspects: This EEG study was done with scalp electrodes positioned according to the 10-20 International system of electrode placement. Electrical activity was reviewed with band pass filter of 1-70Hz , sensitivity of 7 uV/mm, display speed of 32mm/sec with a 60Hz  notched filter applied as appropriate. EEG data were recorded continuously and digitally stored.  Video monitoring was available and reviewed as appropriate. Description: EEG showed continuous generalized 3 to 6 Hz theta- delta slowing admixed with 12 to 15 Hz with activity.  Lateralized periodic discharges with overriding fast activity were noted in left hemisphere with fluctuating frequency of 0.5 to 2 Hz, at times with  overlying rhythmicity, without definite evolution. Hyperventilation and photic stimulation were not performed.   ABNORMALITY -Lateralized periodic discharges with overriding fast activity ( LPD+F), left hemisphere - Continuous slow, generalized IMPRESSION: This study showed evidence of epileptogenicity arising from left hemisphere.  This EEG pattern is on the ictal-interictal continuum with increased risk for seizures.  Additionally there is moderate to severe diffuse encephalopathy.  No definite seizures were seen during  the study. Charlsie Quest   MR BRAIN W WO CONTRAST  Result Date: 09/17/2023 CLINICAL DATA:  Altered mental status and seizure. Brain metastases. EXAM: MRI HEAD WITHOUT AND WITH CONTRAST TECHNIQUE: Multiplanar, multiecho pulse sequences of the brain and surrounding structures were obtained without and with intravenous contrast. CONTRAST:  7mL GADAVIST GADOBUTROL 1 MMOL/ML IV SOLN COMPARISON:  08/29/2023 FINDINGS: Brain: There is abnormal diffusion restriction within the medial left PCA territory, including the dorsomedial left thalamus. There is also diffusion restriction of the left insula. There is vasogenic edema within the anterior left temporal lobe and left occipital lobe, near the regions of diffusion restriction. There contrast-enhancing masses at both locations. The left temporal mass measures 10 mm, unchanged (series 19, image 27). Left occipital mass measures 10 mm, unchanged (image 23). Lesion of the right postcentral gyrus measures 7 mm, previously 9 mm (image 48). There is a focus of likely extra-axial contrast enhancement at the left cerebellopontine angle that measures 7 mm, previously 3 mm (series 19, image 18). The midline structures are normal. Vascular: Normal flow voids. Skull and upper cervical spine: Normal calvarium and skull base. Visualized upper cervical spine and soft tissues are normal. Sinuses/Orbits:No paranasal sinus fluid levels or advanced mucosal thickening.  No mastoid or middle ear effusion. Normal orbits. IMPRESSION: 1. Unchanged size of contrast-enhancing masses of the left temporal lobe and left occipital lobe with progression of surrounding vasogenic edema. 2. Areas of diffusion restriction of the left insula and left PCA territory, near the above described lesions, may be a postictal phenomenon. 3. Slight decrease in size of the right postcentral gyrus metastasis. 4. Increased size of a 7 mm extra-axial contrast-enhancing mass at the left cerebellopontine angle, which could be a meningioma or extra-axial metastasis. Electronically Signed   By: Deatra Robinson M.D.   On: 09/17/2023 19:51   DG Chest Port 1 View  Result Date: 09/17/2023 CLINICAL DATA:  Altered mental status.  Lung cancer. EXAM: PORTABLE CHEST 1 VIEW COMPARISON:  08/27/2023. FINDINGS: There is a right chest wall port a catheter with tip in the distal SVC. Stable cardiomediastinal contours. The right upper lobe treated lung mass is again noted with signs of central cavitary change. There is asymmetric right lung volume loss with increase opacification in the right lower lung compared with the previous exam. Left lung appears clear. IMPRESSION: 1. Increased opacification in the right lower lung compared with the previous exam. Atelectasis and or pleural effusion. Underlying pneumonia would be difficult to exclude. 2. Stable appearance of right upper lobe lung mass with central cavitation. Electronically Signed   By: Signa Kell M.D.   On: 09/17/2023 15:41   CT HEAD WO CONTRAST  Result Date: 09/17/2023 CLINICAL DATA:  Mental status change, unknown cause EXAM: CT HEAD WITHOUT CONTRAST TECHNIQUE: Contiguous axial images were obtained from the base of the skull through the vertex without intravenous contrast. RADIATION DOSE REDUCTION: This exam was performed according to the departmental dose-optimization program which includes automated exposure control, adjustment of the mA and/or kV according to  patient size and/or use of iterative reconstruction technique. COMPARISON:  Brain MR 08/29/23 FINDINGS: Brain: No hemorrhage. No hydrocephalus. No extra-axial fluid collection. No CT evidence of an acute cortical infarct. 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 There is no definite evidence of new metastatic disease on this noncontrast enhanced exam. Vascular: No hyperdense vessel or unexpected calcification. Skull: Normal. Negative for  fracture or focal lesion. Sinuses/Orbits: No acute finding. Other: None. IMPRESSION: 1. No CT evidence of an acute cortical infarct or intracranial hemorrhage. 2. Redemonstrated vasogenic edema at sites of known metastatic disease in the anterior left temporal lobe and in the left occipital lobe. There is no definite evidence of new metastatic disease on this noncontrast enhanced exam. If more detailed characterization for new metastatic disease is clinically warranted, further evaluation with repeat brain MRI recommended. Electronically Signed   By: Lorenza Cambridge M.D.   On: 09/17/2023 14:59     ASSESSMENT/PLAN:  This is a very pleasant 62 year old African-American female with stage IV (T3, N0, M1 C) non-small cell lung cancer, squamous cell carcinoma.  She presented with a right upper lobe lung mass in addition to a solitary brain metastasis and a T1 bone lesion.  She was diagnosed in September 2024. Her PDL1 expression is 20%.    The patient underwent SRS to the solitary brain lesion and is scheduled to complete radiation to T1 in the right upper lobe lung mass  on 08/27/2023.   She unfortunately developed two new brain lesions since her September 2024 MRI. She underwent SRS on 09/06/23 by Dr. [name].    She is currently on Decadron.  She is expected to follow-up with Dr. [name].  She is also on Keppra.   She inititally debated pursuing treatment. She eventually decided  to pursue one cycle of treatment of palliative systemic chemotherapy with carboplatin for an AUC of 5, Taxol 175 mg/m2, and keytruda 200 mg IV every 3 weeks with neulasta support.  She was last seen by Dr. Arbutus Ped and myself last week on 09/27/2023 and was supposed to have treatment on 09/28/2023 but did not show up to her appointment.  I clarified with the patient today if she still interested in pursuing treatment.  She states that she is interested in pursuing treatment.  Given her memory concern, I did discuss her chemotherapy doses with Dr. Arbutus Ped.  Dr. Arbutus Ped recommends keeping her doses of chemotherapy at the standard dose and we could reduce the doses with future cycles, if needed,  if she has any concerns with poor tolerance.   We will reach out to scheduling in the infusion room to work on updating her schedule which is currently incorrect.  She will be coming in on 10/04/2023 for her first cycle of treatment.  She will then need to return on 10/06/2023 for her Neulasta injection  She will require weekly labs and a 1 week follow-up visit to manage any adverse side effects of treatment.    She was previously instructed to take Claritin daily for 4 to 7 days after Neulasta injection for possible myalgias and arthralgias.  She will continue taking Decadron and following with neuro-oncology and radiation oncology.    We would consider her for a blood transfusion if her hemoglobin were less than 8. She has standing orders in for weekly sample to blood bank.  I inquired if she requires social work involvement with assistance getting to her appointments and she declined.  The patient was advised to call immediately if she has any concerning symptoms in the interval. The patient voices understanding of current disease status and treatment options and is in agreement with the current care plan. All questions were answered. The patient knows to call the clinic with any problems, questions or  concerns. We can certainly see the patient much sooner if necessary     Orders Placed This Encounter  Procedures  CBC with Differential (Cancer Center Only)    Standing Status:   Future    Standing Expiration Date:   10/03/2024   CMP (Cancer Center only)    Standing Status:   Future    Standing Expiration Date:   10/03/2024   T4    Standing Status:   Future    Standing Expiration Date:   10/03/2024   TSH    Standing Status:   Future    Standing Expiration Date:   10/03/2024     The total time spent in the appointment was 20-29 minutes  Jennavecia Schwier L Kanoelani Dobies, PA-C 10/02/23

## 2023-10-01 ENCOUNTER — Inpatient Hospital Stay: Payer: 59

## 2023-10-02 ENCOUNTER — Telehealth: Payer: Self-pay | Admitting: Physician Assistant

## 2023-10-02 ENCOUNTER — Inpatient Hospital Stay: Payer: 59

## 2023-10-02 ENCOUNTER — Inpatient Hospital Stay (HOSPITAL_BASED_OUTPATIENT_CLINIC_OR_DEPARTMENT_OTHER): Payer: 59 | Admitting: Physician Assistant

## 2023-10-02 VITALS — BP 149/85 | HR 65 | Temp 97.8°F | Resp 13 | Wt 170.0 lb

## 2023-10-02 DIAGNOSIS — C3491 Malignant neoplasm of unspecified part of right bronchus or lung: Secondary | ICD-10-CM

## 2023-10-02 DIAGNOSIS — Z5112 Encounter for antineoplastic immunotherapy: Secondary | ICD-10-CM | POA: Diagnosis not present

## 2023-10-02 DIAGNOSIS — D649 Anemia, unspecified: Secondary | ICD-10-CM

## 2023-10-02 DIAGNOSIS — Z95828 Presence of other vascular implants and grafts: Secondary | ICD-10-CM

## 2023-10-02 LAB — CMP (CANCER CENTER ONLY)
ALT: 19 U/L (ref 0–44)
AST: 16 U/L (ref 15–41)
Albumin: 3.1 g/dL — ABNORMAL LOW (ref 3.5–5.0)
Alkaline Phosphatase: 59 U/L (ref 38–126)
Anion gap: 2 — ABNORMAL LOW (ref 5–15)
BUN: 12 mg/dL (ref 8–23)
CO2: 39 mmol/L — ABNORMAL HIGH (ref 22–32)
Calcium: 8.1 mg/dL — ABNORMAL LOW (ref 8.9–10.3)
Chloride: 106 mmol/L (ref 98–111)
Creatinine: 0.64 mg/dL (ref 0.44–1.00)
GFR, Estimated: >60 mL/min (ref >60)
Glucose, Bld: 113 mg/dL — ABNORMAL HIGH (ref 70–99)
Potassium: 3.4 mmol/L — ABNORMAL LOW (ref 3.5–5.1)
Sodium: 147 mmol/L — ABNORMAL HIGH (ref 135–145)
Total Bilirubin: 0.5 mg/dL (ref <1.2)
Total Protein: 5.4 g/dL — ABNORMAL LOW (ref 6.5–8.1)

## 2023-10-02 LAB — SAMPLE TO BLOOD BANK

## 2023-10-02 LAB — CBC WITH DIFFERENTIAL (CANCER CENTER ONLY)
Abs Immature Granulocytes: 0.06 10*3/uL (ref 0.00–0.07)
Basophils Absolute: 0 10*3/uL (ref 0.0–0.1)
Basophils Relative: 0 %
Eosinophils Absolute: 0 10*3/uL (ref 0.0–0.5)
Eosinophils Relative: 0 %
HCT: 29.9 % — ABNORMAL LOW (ref 36.0–46.0)
Hemoglobin: 9.5 g/dL — ABNORMAL LOW (ref 12.0–15.0)
Immature Granulocytes: 1 %
Lymphocytes Relative: 2 %
Lymphs Abs: 0.2 10*3/uL — ABNORMAL LOW (ref 0.7–4.0)
MCH: 26.9 pg (ref 26.0–34.0)
MCHC: 31.8 g/dL (ref 30.0–36.0)
MCV: 84.7 fL (ref 80.0–100.0)
Monocytes Absolute: 0.3 10*3/uL (ref 0.1–1.0)
Monocytes Relative: 3 %
Neutro Abs: 11.4 10*3/uL — ABNORMAL HIGH (ref 1.7–7.7)
Neutrophils Relative %: 94 %
Platelet Count: 189 10*3/uL (ref 150–400)
RBC: 3.53 MIL/uL — ABNORMAL LOW (ref 3.87–5.11)
RDW: 18.1 % — ABNORMAL HIGH (ref 11.5–15.5)
WBC Count: 12 10*3/uL — ABNORMAL HIGH (ref 4.0–10.5)
nRBC: 0 % (ref 0.0–0.2)

## 2023-10-02 MED ORDER — SODIUM CHLORIDE 0.9% FLUSH
10.0000 mL | Freq: Once | INTRAVENOUS | Status: AC
  Administered 2023-10-02: 10 mL

## 2023-10-02 MED ORDER — HEPARIN SOD (PORK) LOCK FLUSH 100 UNIT/ML IV SOLN
500.0000 [IU] | Freq: Once | INTRAVENOUS | Status: AC
  Administered 2023-10-02: 500 [IU]

## 2023-10-02 NOTE — Telephone Encounter (Signed)
Spoke with patient sister confirming upcoming appointment  

## 2023-10-03 MED FILL — Fosaprepitant Dimeglumine For IV Infusion 150 MG (Base Eq): INTRAVENOUS | Qty: 5 | Status: AC

## 2023-10-04 ENCOUNTER — Inpatient Hospital Stay: Payer: 59

## 2023-10-04 VITALS — BP 160/87 | HR 80 | Temp 97.8°F | Resp 19 | Wt 169.5 lb

## 2023-10-04 DIAGNOSIS — Z5112 Encounter for antineoplastic immunotherapy: Secondary | ICD-10-CM | POA: Diagnosis not present

## 2023-10-04 DIAGNOSIS — C3491 Malignant neoplasm of unspecified part of right bronchus or lung: Secondary | ICD-10-CM

## 2023-10-04 MED ORDER — SODIUM CHLORIDE 0.9 % IV SOLN: Freq: Once | INTRAVENOUS | Status: AC

## 2023-10-04 MED ORDER — SODIUM CHLORIDE 0.9 % IV SOLN
200.0000 mg | Freq: Once | INTRAVENOUS | Status: AC
  Administered 2023-10-04: 200 mg via INTRAVENOUS
  Filled 2023-10-04: qty 200

## 2023-10-04 MED ORDER — SODIUM CHLORIDE 0.9 % IV SOLN
150.0000 mg | Freq: Once | INTRAVENOUS | Status: AC
  Administered 2023-10-04: 150 mg via INTRAVENOUS
  Filled 2023-10-04 (×3): qty 5
  Filled 2023-10-04: qty 150
  Filled 2023-10-04: qty 5

## 2023-10-04 MED ORDER — HEPARIN SOD (PORK) LOCK FLUSH 100 UNIT/ML IV SOLN
500.0000 [IU] | Freq: Once | INTRAVENOUS | Status: AC | PRN
  Administered 2023-10-04: 500 [IU]

## 2023-10-04 MED ORDER — SODIUM CHLORIDE 0.9% FLUSH
10.0000 mL | INTRAVENOUS | Status: DC | PRN
  Administered 2023-10-04: 10 mL

## 2023-10-04 MED ORDER — PALONOSETRON HCL INJECTION 0.25 MG/5ML
0.2500 mg | Freq: Once | INTRAVENOUS | Status: AC
  Administered 2023-10-04: 0.25 mg via INTRAVENOUS
  Filled 2023-10-04: qty 5

## 2023-10-04 MED ORDER — DEXAMETHASONE SODIUM PHOSPHATE 10 MG/ML IJ SOLN
10.0000 mg | Freq: Once | INTRAMUSCULAR | Status: AC
Start: 2023-10-04 — End: 2023-10-04
  Administered 2023-10-04: 10 mg via INTRAVENOUS
  Filled 2023-10-04: qty 1

## 2023-10-04 MED ORDER — FAMOTIDINE IN NACL 20-0.9 MG/50ML-% IV SOLN
20.0000 mg | Freq: Once | INTRAVENOUS | Status: AC
  Administered 2023-10-04: 20 mg via INTRAVENOUS
  Filled 2023-10-04: qty 50

## 2023-10-04 MED ORDER — DIPHENHYDRAMINE HCL 50 MG/ML IJ SOLN
50.0000 mg | Freq: Once | INTRAMUSCULAR | Status: AC
  Administered 2023-10-04: 50 mg via INTRAVENOUS
  Filled 2023-10-04: qty 1

## 2023-10-04 MED ORDER — SODIUM CHLORIDE 0.9 % IV SOLN
200.0000 mg/m2 | Freq: Once | INTRAVENOUS | Status: AC
  Administered 2023-10-04: 372 mg via INTRAVENOUS
  Filled 2023-10-04: qty 62

## 2023-10-04 MED ORDER — SODIUM CHLORIDE 0.9 % IV SOLN
552.6000 mg | Freq: Once | INTRAVENOUS | Status: AC
  Administered 2023-10-04: 550 mg via INTRAVENOUS
  Filled 2023-10-04: qty 55

## 2023-10-04 NOTE — Patient Instructions (Signed)
[facility] - A DEPT OF [facility]  Discharge Instructions: Thank you for choosing [facility] to provide your oncology and hematology care.   If you have a lab appointment with the Cancer Center, please go directly to the Cancer Center and check in at the registration area.   Wear comfortable clothing and clothing appropriate for easy access to any Portacath or PICC line.   We strive to give you quality time with your provider. You may need to reschedule your appointment if you arrive late (15 or more minutes).  Arriving late affects you and other patients whose appointments are after yours.  Also, if you miss three or more appointments without notifying the office, you may be dismissed from the clinic at the provider's discretion.      For prescription refill requests, have your pharmacy contact our office and allow 72 hours for refills to be completed.    Today you received the following chemotherapy and/or immunotherapy agents Keytruda, Paclitaxel, Carboplatin.      To help prevent nausea and vomiting after your treatment, we encourage you to take your nausea medication as directed.  BELOW ARE SYMPTOMS THAT SHOULD BE REPORTED IMMEDIATELY: *FEVER GREATER THAN 100.4 F (38 C) OR HIGHER *CHILLS OR SWEATING *NAUSEA AND VOMITING THAT IS NOT CONTROLLED WITH YOUR NAUSEA MEDICATION *UNUSUAL SHORTNESS OF BREATH *UNUSUAL BRUISING OR BLEEDING *URINARY PROBLEMS (pain or burning when urinating, or frequent urination) *BOWEL PROBLEMS (unusual diarrhea, constipation, pain near the anus) TENDERNESS IN MOUTH AND THROAT WITH OR WITHOUT PRESENCE OF ULCERS (sore throat, sores in mouth, or a toothache) UNUSUAL RASH, SWELLING OR PAIN  UNUSUAL VAGINAL DISCHARGE OR ITCHING   Items with * indicate a potential emergency and should be followed up as soon as possible or go to the Emergency Department if any problems should occur.  Please show the CHEMOTHERAPY  ALERT CARD or IMMUNOTHERAPY ALERT CARD at check-in to the Emergency Department and triage nurse.  Should you have questions after your visit or need to cancel or reschedule your appointment, please contact [facility] - A DEPT OF [facility]  Dept: [phone]  and follow the prompts.  Office hours are 8:00 a.m. to 4:30 p.m. Monday - Friday. Please note that voicemails left after 4:00 p.m. may not be returned until the following business day.  We are closed weekends and major holidays. You have access to a nurse at all times for urgent questions. Please call the main number to the clinic Dept: [phone] and follow the prompts.   For any non-urgent questions, you may also contact your provider using MyChart. We now offer e-Visits for anyone 82 and older to request care online for non-urgent symptoms. For details visit [email or web address].   Also download the MyChart app! Go to the app store, search "MyChart", open the app, select [place], and log in with your MyChart username and password.

## 2023-10-05 ENCOUNTER — Encounter (HOSPITAL_BASED_OUTPATIENT_CLINIC_OR_DEPARTMENT_OTHER): Payer: Self-pay | Admitting: Internal Medicine

## 2023-10-05 NOTE — Addendum Note (Signed)
Encounter addended by: Marcello Fennel, PA-C on: 10/05/2023 4:25 PM  Actions taken: Clinical Note Signed

## 2023-10-06 ENCOUNTER — Other Ambulatory Visit: Payer: Self-pay

## 2023-10-06 ENCOUNTER — Inpatient Hospital Stay: Payer: 59

## 2023-10-06 VITALS — BP 133/55 | HR 86 | Temp 97.7°F | Resp 16

## 2023-10-06 DIAGNOSIS — C3491 Malignant neoplasm of unspecified part of right bronchus or lung: Secondary | ICD-10-CM

## 2023-10-06 DIAGNOSIS — Z5112 Encounter for antineoplastic immunotherapy: Secondary | ICD-10-CM | POA: Diagnosis not present

## 2023-10-06 MED ORDER — PEGFILGRASTIM INJECTION 6 MG/0.6ML ~~LOC~~
6.0000 mg | PREFILLED_SYRINGE | Freq: Once | SUBCUTANEOUS | Status: AC
  Administered 2023-10-06: 6 mg via SUBCUTANEOUS

## 2023-10-08 ENCOUNTER — Inpatient Hospital Stay: Payer: 59

## 2023-10-08 ENCOUNTER — Other Ambulatory Visit: Payer: 59

## 2023-10-08 ENCOUNTER — Inpatient Hospital Stay: Payer: 59 | Admitting: Internal Medicine

## 2023-10-08 ENCOUNTER — Ambulatory Visit: Payer: 59

## 2023-10-08 ENCOUNTER — Ambulatory Visit: Payer: 59 | Admitting: Physician Assistant

## 2023-10-08 NOTE — Progress Notes (Deleted)
Palliative Medicine [facility]  Telephone:([phone]) [phone] Fax:([phone]) [phone]   Name: [name] Date: 10/08/2023 MRN: [id number]  DOB: 04/18/1961  Patient Care Team: [name]., FNP as PCP - General (Family Medicine) Serena Croissant, MD as Consulting Physician (Hematology and Oncology) Almond Lint, MD as Consulting Physician (General Surgery) Margaretmary Dys, MD as Consulting Physician (Radiation Oncology) Si Gaul, MD as Consulting Physician (Oncology)    REASON FOR CONSULTATION: [name] is a 62 y.o. 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 Patient also has a history of right breast cancer (07/2015), HTN, depression, and HLD.  Palliative ask to see for symptom management and goals of care.    SOCIAL HISTORY:     reports that she quit smoking about 19 years ago. Her smoking use included cigarettes. She started smoking about 39 years ago. She has a 20 pack-year smoking history. She has never used smokeless tobacco. She reports that she does not drink alcohol and does not use drugs.  ADVANCE DIRECTIVES:  Advanced directives on file name [name] as primary and [name] as secondary decision makers should the patient become unable to speak for themselves.   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   Depression    GERD (gastroesophageal reflux disease)    HTN (hypertension)    Hypertension    Iron deficiency anemia    Long-term current use of steroids SYMBICORT INHALER   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    Overweight (BMI 25.0-29.9) 05/11/2016   Palpitations    Personal history of radiation therapy 2016   Sarcoidosis STABLE PER CXR  JUNE 2013   Shortness of breath    Sickle cell trait (HCC)     PAST SURGICAL HISTORY:  Past Surgical History:  Procedure Laterality Date   ADJUSTABLE SUTURE MANIPULATION  05/22/2012   Procedure: ADJUSTABLE SUTURE MANIPULATION;  Surgeon: Corinda Gubler, MD;  Location: [facility];  Service: Ophthalmology;  Laterality: Right;   BREAST LUMPECTOMY Right 2016   BRONCHIAL BIOPSY  07/09/2023   Procedure: BRONCHIAL BIOPSIES;  Surgeon: Leslye Peer, MD;  Location: [facility] ENDOSCOPY;  Service: Pulmonary;;   BRONCHIAL BRUSHINGS  07/09/2023   Procedure: BRONCHIAL BRUSHINGS;  Surgeon: Leslye Peer, MD;  Location[facility] ENDOSCOPY;  Service: Pulmonary;;   BRONCHIAL NEEDLE ASPIRATION BIOPSY  07/09/2023   Procedure: BRONCHIAL NEEDLE ASPIRATION BIOPSIES;  Surgeon: Leslye Peer, MD;  Location: [facility] ENDOSCOPY;  Service: Pulmonary;;   CESAREAN SECTION  1986   W/ BILATERAL TUBAL LIGATION   COLONOSCOPY WITH PROPOFOL N/A 02/21/2019   Procedure: COLONOSCOPY WITH PROPOFOL;  Surgeon: Hilarie Fredrickson, MD;  Location: [facility] ENDOSCOPY;  Service: Endoscopy;  Laterality: N/A;   EYE SURGERY     IR IMAGING GUIDED PORT INSERTION  08/01/2023   MEDIAN RECTUS REPAIR  05/22/2012   Procedure: MEDIAN RECTUS REPAIR;  Surgeon: Corinda Gubler, MD;  Location: [facility];  Service: Ophthalmology;  Laterality: Bilateral;  INFERIOR RECTUS RESECTION WITH ADJUSTIBLE SUTURES RIGHT EYE    POLYPECTOMY  02/21/2019   Procedure: POLYPECTOMY;  Surgeon: Hilarie Fredrickson, MD;  Location: [facility] ENDOSCOPY;  Service: Endoscopy;;   RADIOACTIVE SEED GUIDED PARTIAL  MASTECTOMY WITH AXILLARY SENTINEL LYMPH NODE BIOPSY Right 08/18/2015   Procedure: RADIOACTIVE SEED GUIDED PARTIAL MASTECTOMY WITH AXILLARY SENTINEL LYMPH NODE BIOPSY;  Surgeon: Almond Lint, MD;  Location: [facility];  Service: General;  Laterality: Right;   TOTAL ROBOTIC ASSISTED LAPAROSCOPIC HYSTERECTOMY  12-30-2010   SYMPTOMATIC UTERINE FIBROIDS   UPPER  TEETH EXTRACTION'S  1992   VIDEO BRONCHOSCOPY WITH RADIAL ENDOBRONCHIAL ULTRASOUND  07/09/2023   Procedure: VIDEO BRONCHOSCOPY WITH RADIAL ENDOBRONCHIAL ULTRASOUND;  Surgeon: Leslye Peer, MD;  Location: [facility] ENDOSCOPY;  Service: Pulmonary;;    HEMATOLOGY/ONCOLOGY HISTORY:  Oncology History  Breast cancer of upper-outer quadrant of right female breast (HCC)  07/08/2015 Mammogram   Right breast calcifications 11 mm span   08/04/2015 Initial Diagnosis   Biopsy right breast: Invasive ductal carcinoma with DCIS with calcifications, lymphovascular invasion is identified, grade 2-3, ER 100%, PR 100% Ki-67 20%, HER-2 negative ratio 1.46   08/04/2015 Clinical Stage   Stage IA: T1b N0   08/18/2015 Definitive Surgery   Right  lumpectomy: invasive ductal carcinoma, grade 2, 0.6 cm, with DCIS high-grade, margins negative, 0/2 lymph nodes negative, ER 100%, PR positive, HER-2 negative, Ki-67 20%   08/18/2015 Pathologic Stage   Stage IA: T1b N0   08/18/2015 Oncotype testing   Insufficient tissue to perform   09/21/2015 - 10/20/2015 Radiation Therapy   Adjuvant radiation therapy: 1) Right Breast / 40.05 Gy in 15 fractions 2) Right breast boost / 10 Gy in 5 fractions   10/08/2015 - 10/11/2015 Hospital Admission   cellulitis of the right breast   11/16/2015 -  Anti-estrogen oral therapy   Anastrozole 1 mg daily once hot flashes better controlled. Planned duration of therapy 5 years.    Squamous cell lung cancer, right (HCC)  07/17/2023 Initial Diagnosis   Squamous cell lung cancer, right (HCC)   07/26/2023 Cancer Staging   Staging form: Lung, AJCC 8th Edition - Clinical: Stage IVB (cT3, cN0, cM1c) - Signed by Si Gaul, MD on 07/26/2023   10/04/2023 -  Chemotherapy   Patient is on Treatment Plan : LUNG NSCLC Carboplatin (6) + Paclitaxel (200) + Pembrolizumab (200) D1 q21d x 4 cycles / Pembrolizumab (200) Maintenance D1 q21d       ALLERGIES:  is allergic to codeine.  MEDICATIONS:  Current  Outpatient Medications  Medication Sig Dispense Refill   acetaminophen (TYLENOL) 325 MG tablet Take 650 mg by mouth every 6 (six) hours as needed for mild pain or moderate pain.     albuterol (PROVENTIL) (2.5 MG/3ML) 0.083% nebulizer solution Take 3 mLs (2.5 mg total) by nebulization every 6 (six) hours as needed for wheezing or shortness of breath. 75 mL 1   albuterol (VENTOLIN HFA) 108 (90 Base) MCG/ACT inhaler INHALE 2 PUFFS EVERY 6 HOURS AS NEEDED FOR WHEEZING OR SHORTNESS OF BREATH (Patient taking differently: Inhale 2 puffs into the lungs every 6 (six) hours as needed for shortness of breath or wheezing.) 54 g 0   aspirin 81 MG chewable tablet Chew 1 tablet (81 mg total) by mouth daily. 30 tablet 0   atorvastatin (LIPITOR) 20 MG tablet TAKE 1 TABLET (20 MG TOTAL) BY MOUTH DAILY. 90 tablet 3   dexamethasone (DECADRON) 4 MG tablet Take 1 tablet (4 mg total) by mouth every 6 (six) hours. 120 tablet 0   fluticasone (FLONASE) 50 MCG/ACT nasal spray USE 2 SPRAYS IN BOTH  NOSTRILS DAILY AS NEEDED  FOR ALLERGIES (Patient taking differently: Place 2 sprays into both nostrils daily as  needed for allergies.) 48 g 11   levETIRAcetam (KEPPRA) 750 MG tablet Take 1 tablet (750 mg total) by mouth 2 (two) times daily. 60 tablet 0   lidocaine-prilocaine (EMLA) cream Apply to affected area once (Patient taking differently: Apply 1 Application topically See admin instructions. Apply to affected area once as directed) 30 g 3   metoprolol tartrate (LOPRESSOR) 25 MG tablet Take 1 tablet (25 mg total) by mouth daily.     montelukast (SINGULAIR) 10 MG tablet Take 10 mg by mouth at bedtime.     Multiple Vitamin (MULTIVITAMIN) capsule Take 1 capsule by mouth daily with breakfast.     omeprazole (PRILOSEC) 20 MG capsule TAKE 1 CAPSULE EVERY DAY (Patient taking differently: Take 20 mg by mouth daily before breakfast.) 90 capsule 1   ondansetron (ZOFRAN) 4 MG tablet Take 1 tablet (4 mg total) by mouth every 6 (six) hours as  needed for nausea. 20 tablet 0   OXYGEN Inhale 2-3 L/min into the lungs See admin instructions. Inhale 2-3 L/min into the lungs CONTINUOUSLY; 2 L/min at rest and 3 L/min if exerted     potassium chloride SA (KLOR-CON M) 20 MEQ tablet Take 1 tablet (20 mEq total) by mouth daily. 5 tablet 0   prochlorperazine (COMPAZINE) 10 MG tablet Take 1 tablet (10 mg total) by mouth every 6 (six) hours as needed for nausea or vomiting. 30 tablet 1   sertraline (ZOLOFT) 50 MG tablet TAKE 1 TABLET EVERY DAY (Patient taking differently: Take 50 mg by mouth daily.) 90 tablet 1   STIOLTO RESPIMAT 2.5-2.5 MCG/ACT AERS USE 2 INHALATIONS BY MOUTH DAILY 12 g 3   No current facility-administered medications for this visit.    VITAL SIGNS: There were no vitals taken for this visit. There were no vitals filed for this visit.  Estimated body mass index is 28.21 kg/m as calculated from the following:   Height as of 09/17/23: 5\' 5"  (1.651 m).   Weight as of 10/04/23: 169 lb 8 oz (76.9 kg).  LABS: CBC:    Component Value Date/Time   WBC 12.0 (H) 10/02/2023 1033   WBC 9.6 09/21/2023 0928   HGB 9.5 (L) 10/02/2023 1033   HCT 29.9 (L) 10/02/2023 1033   HCT 29.8 (L) 01/10/2019 1219   PLT 189 10/02/2023 1033   MCV 84.7 10/02/2023 1033   NEUTROABS 11.4 (H) 10/02/2023 1033   LYMPHSABS 0.2 (L) 10/02/2023 1033   MONOABS 0.3 10/02/2023 1033   EOSABS 0.0 10/02/2023 1033   BASOSABS 0.0 10/02/2023 1033   Comprehensive Metabolic Panel:    Component Value Date/Time   NA 147 (H) 10/02/2023 1033   NA 146 (H) 01/10/2019 1220   K 3.4 (L) 10/02/2023 1033   CL 106 10/02/2023 1033   CO2 39 (H) 10/02/2023 1033   BUN 12 10/02/2023 1033   BUN 21 01/10/2019 1220   CREATININE 0.64 10/02/2023 1033   CREATININE 0.64 02/04/2015 1619   GLUCOSE 113 (H) 10/02/2023 1033   CALCIUM 8.1 (L) 10/02/2023 1033   AST 16 10/02/2023 1033   ALT 19 10/02/2023 1033   ALKPHOS 59 10/02/2023 1033   BILITOT 0.5 10/02/2023 1033   PROT 5.4 (L)  10/02/2023 1033   PROT 7.7 02/05/2017 1530   ALBUMIN 3.1 (L) 10/02/2023 1033   ALBUMIN 4.4 02/05/2017 1530    RADIOGRAPHIC STUDIES: CT CHEST W CONTRAST  Result Date: 09/19/2023 CLINICAL DATA:  Non-small cell lung cancer staging; * Tracking Code: BO * EXAM: CT CHEST WITH CONTRAST TECHNIQUE:  Multidetector CT imaging of the chest was performed during intravenous contrast administration. RADIATION DOSE REDUCTION: This exam was performed according to the departmental dose-optimization program which includes automated exposure control, adjustment of the mA and/or kV according to patient size and/or use of iterative reconstruction technique. CONTRAST:  50mL OMNIPAQUE IOHEXOL 350 MG/ML SOLN COMPARISON:  PET-CT dated August 11, 2023; chest CT dated June 26, 2023 FINDINGS: Cardiovascular: Normal heart size. No pericardial effusion. Normal caliber thoracic aorta with moderate atherosclerotic disease. Mediastinum/Nodes: Esophagus and thyroid are unremarkable. No enlarged lymph nodes seen in the chest. Lungs/Pleura: Right upper lobe mass is decreased in size with new areas of cavitation measuring 4.8 x 3.6 cm, previously 6.3 x 5.1 cm. Stable irregular linear opacities of the bilateral lower lobes, likely due to scarring. New small right pleural effusion. Severe emphysema. Upper Abdomen: Stable cystic lesion of the anterior right kidney. No acute abnormality. Musculoskeletal: No chest wall abnormality. Unchanged diffuse sclerosis of the T3 vertebral body. IMPRESSION: 1. Right upper lobe mass is decreased in size with new areas of cavitation. 2. New small right pleural effusion. 3. Unchanged diffuse sclerosis of the T3 vertebral body. No evidence of progressive metastatic disease. 4. Aortic Atherosclerosis (ICD10-I70.0) and Emphysema (ICD10-J43.9). Electronically Signed   By: Allegra Lai M.D.   On: 09/19/2023 11:11  MR BRAIN W WO CONTRAST  Result Date: 09/17/2023 CLINICAL DATA:  Altered mental status and  seizure. Brain metastases. EXAM: MRI HEAD WITHOUT AND WITH CONTRAST TECHNIQUE: Multiplanar, multiecho pulse sequences of the brain and surrounding structures were obtained without and with intravenous contrast. CONTRAST:  7mL GADAVIST GADOBUTROL 1 MMOL/ML IV SOLN COMPARISON:  08/29/2023 FINDINGS: Brain: There is abnormal diffusion restriction within the medial left PCA territory, including the dorsomedial left thalamus. There is also diffusion restriction of the left insula. There is vasogenic edema within the anterior left temporal lobe and left occipital lobe, near the regions of diffusion restriction. There contrast-enhancing masses at both locations. The left temporal mass measures 10 mm, unchanged (series 19, image 27). Left occipital mass measures 10 mm, unchanged (image 23). Lesion of the right postcentral gyrus measures 7 mm, previously 9 mm (image 48). There is a focus of likely extra-axial contrast enhancement at the left cerebellopontine angle that measures 7 mm, previously 3 mm (series 19, image 18). The midline structures are normal. Vascular: Normal flow voids. Skull and upper cervical spine: Normal calvarium and skull base. Visualized upper cervical spine and soft tissues are normal. Sinuses/Orbits:No paranasal sinus fluid levels or advanced mucosal thickening. No mastoid or middle ear effusion. Normal orbits. IMPRESSION: 1. Unchanged size of contrast-enhancing masses of the left temporal lobe and left occipital lobe with progression of surrounding vasogenic edema. 2. Areas of diffusion restriction of the left insula and left PCA territory, near the above described lesions, may be a postictal phenomenon. 3. Slight decrease in size of the right postcentral gyrus metastasis. 4. Increased size of a 7 mm extra-axial contrast-enhancing mass at the left cerebellopontine angle, which could be a meningioma or extra-axial metastasis. Electronically Signed   By: Deatra Robinson M.D.   On: 09/17/2023 19:51    PERFORMANCE STATUS (ECOG) : {CHL ONC ECOG NG:[id number]}  Review of Systems Unless otherwise noted, a complete review of systems is negative.  Physical Exam General: NAD Cardiovascular: regular rate and rhythm Pulmonary: clear ant fields Abdomen: soft, nontender, + bowel sounds Extremities: no edema, no joint deformities Skin: no rashes Neurological: Alert and oriented x3  IMPRESSION: *** I introduced myself, Watson Robarge RN,  and Palliative's role in collaboration with the oncology team. Concept of Palliative Care was introduced as specialized medical care for people and their families living with serious illness.  It focuses on providing relief from the symptoms and stress of a serious illness.  The goal is to improve quality of life for both the patient and the family. Values and goals of care important to patient and family were attempted to be elicited.    We discussed *** current illness and what it means in the larger context of *** on-going co-morbidities. Natural disease trajectory and expectations were discussed.  I discussed the importance of continued conversation with family and their medical providers regarding overall plan of care and treatment options, ensuring decisions are within the context of the patients values and GOCs.  PLAN: Established therapeutic relationship. Education provided on palliative's role in collaboration with their Oncology/Radiation team. I will plan to see patient back in 2-4 weeks in collaboration to other oncology appointments.    Patient expressed understanding and was in agreement with this plan. She also understands that She can call the clinic at any time with any questions, concerns, or complaints.   Thank you for your referral and allowing Palliative to assist in Mrs. Louie Boston Overstreet's care.   Number and complexity of problems addressed: ***HIGH - 1 or more chronic illnesses with SEVERE exacerbation, progression, or side effects of treatment  - advanced cancer, pain. Any controlled substances utilized were prescribed in the context of palliative care.   Visit consisted of counseling and education dealing with the complex and emotionally intense issues of symptom management and palliative care in the setting of serious and potentially life-threatening illness.  Signed by: Willette Alma, AGPCNP-BC Palliative Medicine Team/[facility]   *Please note that this is a verbal dictation therefore any spelling or grammatical errors are due to the "[employer]agon [employer] One" system interpretation.

## 2023-10-09 ENCOUNTER — Telehealth: Payer: Self-pay | Admitting: Medical Oncology

## 2023-10-09 ENCOUNTER — Inpatient Hospital Stay: Payer: 59

## 2023-10-09 ENCOUNTER — Inpatient Hospital Stay: Payer: 59 | Admitting: Nurse Practitioner

## 2023-10-09 ENCOUNTER — Ambulatory Visit
Admission: RE | Admit: 2023-10-09 | Discharge: 2023-10-09 | Disposition: A | Discharge status: Home / Self Care | Payer: 59 | Source: Ambulatory Visit | Attending: Radiation Oncology | Admitting: Radiation Oncology

## 2023-10-09 ENCOUNTER — Inpatient Hospital Stay: Payer: 59 | Admitting: Internal Medicine

## 2023-10-09 DIAGNOSIS — C3490 Malignant neoplasm of unspecified part of unspecified bronchus or lung: Secondary | ICD-10-CM | POA: Insufficient documentation

## 2023-10-09 DIAGNOSIS — C7931 Secondary malignant neoplasm of brain: Secondary | ICD-10-CM | POA: Insufficient documentation

## 2023-10-09 DIAGNOSIS — G936 Cerebral edema: Secondary | ICD-10-CM | POA: Insufficient documentation

## 2023-10-09 NOTE — Progress Notes (Signed)
  Radiation Oncology         [phone] ________________________________  Name: [name] MRN: [id number]  Date of Service: 10/09/2023  DOB: Oct 13, 1961  Post Treatment Telephone Note  Diagnosis:  62 yo woman with two new brain metastases from right upper lung cancer (as documented in provider EOT note)  The patient was available for call today.  The patient did  note fatigue during radiation but has since improved. The patient did  note hair loss or skin changes in the field of radiation during therapy. The patient is taking dexamethasone and has about 2-3 days left. The patient does not have symptoms of  weakness or loss of control of the extremities. The patient does not have symptoms of headache. The patient does not have symptoms of seizure or uncontrolled movement. The patient does not have symptoms of changes in vision. The patient does not have changes in speech. The patient does not have confusion.   The patient was counseled that she will be contacted by our brain and spine navigator to schedule surveillance imaging. The patient was encouraged to call if she have not received a call to schedule imaging, or if she develops concerns or questions regarding radiation. The patient will also continue to follow up with Dr. Arbutus Ped in medical oncology.   This concludes the interaction.  Ruel Favors, LPN

## 2023-10-09 NOTE — Telephone Encounter (Signed)
Dull , pain is in a few finger tips and a few toes, not in joints,.no numbness or tingling. She said it does not happen everyday and does not last all day. I told her to continue tylenol and apply heat to hands and feet. I  instructed her to call if it gets worse.

## 2023-10-09 NOTE — Telephone Encounter (Signed)
Pain ,diarrhea , [name] reported that [name] has sharp pain in her feet and hands that started yesterday and is having 7-8 ;liquid stools since yesterday .  Tylenol does not help the pain  .   I instructed dtr to get imodium otc and give her 2 tablets after the first diarrheal stool of the day , then 1 tablet after each successive diarrheal stool up to 6 /day.  [name] cancelled appts scheduled for today due to symptoms. Dr [name] and Palliative care notified. Schedule message sent to r/s port flush with labs to Friday   Will ask Cassie to advise re pain.

## 2023-10-10 ENCOUNTER — Encounter: Payer: Self-pay | Admitting: Internal Medicine

## 2023-10-10 ENCOUNTER — Ambulatory Visit: Payer: 59

## 2023-10-10 ENCOUNTER — Encounter: Payer: Self-pay | Admitting: Hematology and Oncology

## 2023-10-10 NOTE — Telephone Encounter (Signed)
F/u from yesterday pain/diarrhea. Pt stated " I'm fine today". She offered no concerns.

## 2023-10-12 ENCOUNTER — Inpatient Hospital Stay (HOSPITAL_COMMUNITY)
Admission: EM | Admit: 2023-10-12 | Discharge: 2023-10-19 | DRG: 871 | Disposition: A | Discharge status: Home Health | Payer: 59 | Attending: Internal Medicine | Admitting: Internal Medicine

## 2023-10-12 ENCOUNTER — Other Ambulatory Visit: Payer: Self-pay

## 2023-10-12 ENCOUNTER — Inpatient Hospital Stay: Payer: 59

## 2023-10-12 ENCOUNTER — Emergency Department (HOSPITAL_COMMUNITY): Payer: 59

## 2023-10-12 ENCOUNTER — Encounter (HOSPITAL_COMMUNITY): Payer: Self-pay

## 2023-10-12 DIAGNOSIS — W01198A Fall on same level from slipping, tripping and stumbling with subsequent striking against other object, initial encounter: Secondary | ICD-10-CM | POA: Diagnosis not present

## 2023-10-12 DIAGNOSIS — D61810 Antineoplastic chemotherapy induced pancytopenia: Secondary | ICD-10-CM | POA: Diagnosis present

## 2023-10-12 DIAGNOSIS — D6959 Other secondary thrombocytopenia: Secondary | ICD-10-CM | POA: Diagnosis present

## 2023-10-12 DIAGNOSIS — M48061 Spinal stenosis, lumbar region without neurogenic claudication: Secondary | ICD-10-CM | POA: Diagnosis not present

## 2023-10-12 DIAGNOSIS — I10 Essential (primary) hypertension: Secondary | ICD-10-CM | POA: Diagnosis present

## 2023-10-12 DIAGNOSIS — F419 Anxiety disorder, unspecified: Secondary | ICD-10-CM | POA: Diagnosis present

## 2023-10-12 DIAGNOSIS — N179 Acute kidney failure, unspecified: Secondary | ICD-10-CM | POA: Diagnosis not present

## 2023-10-12 DIAGNOSIS — D573 Sickle-cell trait: Secondary | ICD-10-CM | POA: Diagnosis present

## 2023-10-12 DIAGNOSIS — J9611 Chronic respiratory failure with hypoxia: Secondary | ICD-10-CM | POA: Diagnosis present

## 2023-10-12 DIAGNOSIS — E785 Hyperlipidemia, unspecified: Secondary | ICD-10-CM | POA: Diagnosis present

## 2023-10-12 DIAGNOSIS — E876 Hypokalemia: Secondary | ICD-10-CM | POA: Diagnosis present

## 2023-10-12 DIAGNOSIS — Z515 Encounter for palliative care: Secondary | ICD-10-CM

## 2023-10-12 DIAGNOSIS — C787 Secondary malignant neoplasm of liver and intrahepatic bile duct: Secondary | ICD-10-CM | POA: Diagnosis present

## 2023-10-12 DIAGNOSIS — J449 Chronic obstructive pulmonary disease, unspecified: Secondary | ICD-10-CM | POA: Diagnosis present

## 2023-10-12 DIAGNOSIS — Z8249 Family history of ischemic heart disease and other diseases of the circulatory system: Secondary | ICD-10-CM

## 2023-10-12 DIAGNOSIS — C7931 Secondary malignant neoplasm of brain: Secondary | ICD-10-CM | POA: Diagnosis present

## 2023-10-12 DIAGNOSIS — Z66 Do not resuscitate: Secondary | ICD-10-CM | POA: Diagnosis present

## 2023-10-12 DIAGNOSIS — Z8349 Family history of other endocrine, nutritional and metabolic diseases: Secondary | ICD-10-CM

## 2023-10-12 DIAGNOSIS — C7951 Secondary malignant neoplasm of bone: Secondary | ICD-10-CM | POA: Diagnosis present

## 2023-10-12 DIAGNOSIS — A419 Sepsis, unspecified organism: Principal | ICD-10-CM | POA: Diagnosis present

## 2023-10-12 DIAGNOSIS — D709 Neutropenia, unspecified: Secondary | ICD-10-CM | POA: Diagnosis not present

## 2023-10-12 DIAGNOSIS — Z7982 Long term (current) use of aspirin: Secondary | ICD-10-CM | POA: Diagnosis not present

## 2023-10-12 DIAGNOSIS — Z7952 Long term (current) use of systemic steroids: Secondary | ICD-10-CM

## 2023-10-12 DIAGNOSIS — D869 Sarcoidosis, unspecified: Secondary | ICD-10-CM | POA: Diagnosis present

## 2023-10-12 DIAGNOSIS — Z853 Personal history of malignant neoplasm of breast: Secondary | ICD-10-CM | POA: Diagnosis not present

## 2023-10-12 DIAGNOSIS — K521 Toxic gastroenteritis and colitis: Secondary | ICD-10-CM | POA: Diagnosis present

## 2023-10-12 DIAGNOSIS — R6510 Systemic inflammatory response syndrome (SIRS) of non-infectious origin without acute organ dysfunction: Secondary | ICD-10-CM

## 2023-10-12 DIAGNOSIS — R531 Weakness: Secondary | ICD-10-CM | POA: Diagnosis not present

## 2023-10-12 DIAGNOSIS — R5081 Fever presenting with conditions classified elsewhere: Secondary | ICD-10-CM | POA: Diagnosis not present

## 2023-10-12 DIAGNOSIS — Z043 Encounter for examination and observation following other accident: Secondary | ICD-10-CM | POA: Diagnosis present

## 2023-10-12 DIAGNOSIS — D61818 Other pancytopenia: Secondary | ICD-10-CM | POA: Diagnosis not present

## 2023-10-12 DIAGNOSIS — Z90710 Acquired absence of both cervix and uterus: Secondary | ICD-10-CM

## 2023-10-12 DIAGNOSIS — C799 Secondary malignant neoplasm of unspecified site: Secondary | ICD-10-CM

## 2023-10-12 DIAGNOSIS — R791 Abnormal coagulation profile: Secondary | ICD-10-CM | POA: Diagnosis present

## 2023-10-12 DIAGNOSIS — T451X5A Adverse effect of antineoplastic and immunosuppressive drugs, initial encounter: Secondary | ICD-10-CM | POA: Diagnosis present

## 2023-10-12 DIAGNOSIS — Z9981 Dependence on supplemental oxygen: Secondary | ICD-10-CM

## 2023-10-12 DIAGNOSIS — Z8589 Personal history of malignant neoplasm of other organs and systems: Secondary | ICD-10-CM

## 2023-10-12 DIAGNOSIS — Z885 Allergy status to narcotic agent status: Secondary | ICD-10-CM

## 2023-10-12 DIAGNOSIS — Z825 Family history of asthma and other chronic lower respiratory diseases: Secondary | ICD-10-CM

## 2023-10-12 DIAGNOSIS — K219 Gastro-esophageal reflux disease without esophagitis: Secondary | ICD-10-CM | POA: Diagnosis present

## 2023-10-12 DIAGNOSIS — Z1152 Encounter for screening for COVID-19: Secondary | ICD-10-CM

## 2023-10-12 DIAGNOSIS — M5030 Other cervical disc degeneration, unspecified cervical region: Secondary | ICD-10-CM | POA: Diagnosis not present

## 2023-10-12 DIAGNOSIS — Z85118 Personal history of other malignant neoplasm of bronchus and lung: Secondary | ICD-10-CM

## 2023-10-12 DIAGNOSIS — Z8269 Family history of other diseases of the musculoskeletal system and connective tissue: Secondary | ICD-10-CM

## 2023-10-12 DIAGNOSIS — K769 Liver disease, unspecified: Secondary | ICD-10-CM | POA: Diagnosis not present

## 2023-10-12 DIAGNOSIS — D62 Acute posthemorrhagic anemia: Secondary | ICD-10-CM | POA: Diagnosis not present

## 2023-10-12 DIAGNOSIS — Z803 Family history of malignant neoplasm of breast: Secondary | ICD-10-CM

## 2023-10-12 DIAGNOSIS — Z79899 Other long term (current) drug therapy: Secondary | ICD-10-CM

## 2023-10-12 DIAGNOSIS — F32A Depression, unspecified: Secondary | ICD-10-CM | POA: Diagnosis present

## 2023-10-12 DIAGNOSIS — D696 Thrombocytopenia, unspecified: Secondary | ICD-10-CM

## 2023-10-12 DIAGNOSIS — M47816 Spondylosis without myelopathy or radiculopathy, lumbar region: Secondary | ICD-10-CM | POA: Diagnosis not present

## 2023-10-12 DIAGNOSIS — C3491 Malignant neoplasm of unspecified part of right bronchus or lung: Secondary | ICD-10-CM

## 2023-10-12 DIAGNOSIS — Z87891 Personal history of nicotine dependence: Secondary | ICD-10-CM

## 2023-10-12 DIAGNOSIS — G936 Cerebral edema: Secondary | ICD-10-CM | POA: Diagnosis not present

## 2023-10-12 LAB — C DIFFICILE QUICK SCREEN W PCR REFLEX
C Diff antigen: NEGATIVE
C Diff interpretation: NOT DETECTED
C Diff toxin: NEGATIVE

## 2023-10-12 LAB — URINALYSIS, W/ REFLEX TO CULTURE (INFECTION SUSPECTED)
Bilirubin Urine: NEGATIVE
Glucose, UA: NEGATIVE mg/dL
Ketones, ur: 5 mg/dL — AB
Leukocytes,Ua: NEGATIVE
Nitrite: NEGATIVE
Protein, ur: 30 mg/dL — AB
Specific Gravity, Urine: 1.01 (ref 1.005–1.030)
pH: 7 (ref 5.0–8.0)

## 2023-10-12 LAB — CBC WITH DIFFERENTIAL/PLATELET
Abs Immature Granulocytes: 0 10*3/uL (ref 0.00–0.07)
Basophils Absolute: 0 10*3/uL (ref 0.0–0.1)
Basophils Relative: 1 %
Eosinophils Absolute: 0 10*3/uL (ref 0.0–0.5)
Eosinophils Relative: 5 %
HCT: 27.2 % — ABNORMAL LOW (ref 36.0–46.0)
Hemoglobin: 8.5 g/dL — ABNORMAL LOW (ref 12.0–15.0)
Immature Granulocytes: 0 %
Lymphocytes Relative: 12 %
Lymphs Abs: 0.1 10*3/uL — ABNORMAL LOW (ref 0.7–4.0)
MCH: 26.6 pg (ref 26.0–34.0)
MCHC: 31.3 g/dL (ref 30.0–36.0)
MCV: 85.3 fL (ref 80.0–100.0)
Monocytes Absolute: 0.1 10*3/uL (ref 0.1–1.0)
Monocytes Relative: 12 %
Neutro Abs: 0.6 10*3/uL — ABNORMAL LOW (ref 1.7–7.7)
Neutrophils Relative %: 70 %
Platelets: 21 10*3/uL — CL (ref 150–400)
RBC: 3.19 MIL/uL — ABNORMAL LOW (ref 3.87–5.11)
RDW: 16.4 % — ABNORMAL HIGH (ref 11.5–15.5)
WBC: 0.9 10*3/uL — CL (ref 4.0–10.5)
nRBC: 0 % (ref 0.0–0.2)

## 2023-10-12 LAB — RESP PANEL BY RT-PCR (RSV, FLU A&B, COVID)  RVPGX2
Influenza A by PCR: NEGATIVE
Influenza B by PCR: NEGATIVE
Resp Syncytial Virus by PCR: NEGATIVE
SARS Coronavirus 2 by RT PCR: NEGATIVE

## 2023-10-12 LAB — COMPREHENSIVE METABOLIC PANEL
ALT: 13 U/L (ref 0–44)
AST: 21 U/L (ref 15–41)
Albumin: 2.5 g/dL — ABNORMAL LOW (ref 3.5–5.0)
Alkaline Phosphatase: 47 U/L (ref 38–126)
Anion gap: 13 (ref 5–15)
BUN: 12 mg/dL (ref 8–23)
CO2: 35 mmol/L — ABNORMAL HIGH (ref 22–32)
Calcium: 7.5 mg/dL — ABNORMAL LOW (ref 8.9–10.3)
Chloride: 93 mmol/L — ABNORMAL LOW (ref 98–111)
Creatinine, Ser: 0.88 mg/dL (ref 0.44–1.00)
GFR, Estimated: >60 mL/min (ref >60)
Glucose, Bld: 76 mg/dL (ref 70–99)
Potassium: 3 mmol/L — ABNORMAL LOW (ref 3.5–5.1)
Sodium: 141 mmol/L (ref 135–145)
Total Bilirubin: 1.4 mg/dL — ABNORMAL HIGH (ref <1.2)
Total Protein: 5.1 g/dL — ABNORMAL LOW (ref 6.5–8.1)

## 2023-10-12 LAB — APTT: aPTT: 36 s (ref 24–36)

## 2023-10-12 LAB — PATHOLOGIST SMEAR REVIEW

## 2023-10-12 LAB — PROTIME-INR
INR: 1.6 — ABNORMAL HIGH (ref 0.8–1.2)
Prothrombin Time: 18.9 s — ABNORMAL HIGH (ref 11.4–15.2)

## 2023-10-12 LAB — MAGNESIUM: Magnesium: 1 mg/dL — ABNORMAL LOW (ref 1.7–2.4)

## 2023-10-12 LAB — I-STAT CG4 LACTIC ACID, ED: Lactic Acid, Venous: 1.2 mmol/L (ref 0.5–1.9)

## 2023-10-12 MED ORDER — PANTOPRAZOLE SODIUM 40 MG PO TBEC
40.0000 mg | DELAYED_RELEASE_TABLET | Freq: Every day | ORAL | Status: DC
  Administered 2023-10-13 – 2023-10-19 (×7): 40 mg via ORAL
  Filled 2023-10-12 (×7): qty 1

## 2023-10-12 MED ORDER — SODIUM CHLORIDE 0.9% FLUSH
10.0000 mL | Freq: Two times a day (BID) | INTRAVENOUS | Status: DC
  Administered 2023-10-12 – 2023-10-19 (×11): 10 mL via INTRAVENOUS

## 2023-10-12 MED ORDER — ACETAMINOPHEN 650 MG RE SUPP: 650.0000 mg | Freq: Four times a day (QID) | RECTAL | Status: DC | PRN

## 2023-10-12 MED ORDER — SODIUM CHLORIDE 0.9 % IV BOLUS (SEPSIS)
1000.0000 mL | Freq: Once | INTRAVENOUS | Status: AC
Start: 2023-10-12 — End: 2023-10-12
  Administered 2023-10-12: 1000 mL via INTRAVENOUS

## 2023-10-12 MED ORDER — ALBUTEROL SULFATE (2.5 MG/3ML) 0.083% IN NEBU: 3.0000 mL | INHALATION_SOLUTION | Freq: Four times a day (QID) | RESPIRATORY_TRACT | Status: DC | PRN

## 2023-10-12 MED ORDER — ASPIRIN 81 MG PO CHEW: 81.0000 mg | CHEWABLE_TABLET | Freq: Every day | ORAL | Status: DC

## 2023-10-12 MED ORDER — OXYCODONE-ACETAMINOPHEN 5-325 MG PO TABS
1.0000 | ORAL_TABLET | Freq: Four times a day (QID) | ORAL | Status: DC | PRN
  Administered 2023-10-12 – 2023-10-17 (×3): 1 via ORAL
  Filled 2023-10-12 (×5): qty 1

## 2023-10-12 MED ORDER — LEVETIRACETAM 750 MG PO TABS
750.0000 mg | ORAL_TABLET | Freq: Two times a day (BID) | ORAL | Status: DC
  Administered 2023-10-12 – 2023-10-19 (×14): 750 mg via ORAL
  Filled 2023-10-12 (×16): qty 1

## 2023-10-12 MED ORDER — POTASSIUM CHLORIDE 10 MEQ/100ML IV SOLN
10.0000 meq | INTRAVENOUS | Status: AC
Start: 2023-10-12 — End: 2023-10-12
  Administered 2023-10-12 (×3): 10 meq via INTRAVENOUS
  Filled 2023-10-12 (×3): qty 100

## 2023-10-12 MED ORDER — PIPERACILLIN-TAZOBACTAM 3.375 G IVPB 30 MIN
3.3750 g | Freq: Once | INTRAVENOUS | Status: AC
  Administered 2023-10-12: 3.375 g via INTRAVENOUS
  Filled 2023-10-12: qty 50

## 2023-10-12 MED ORDER — MONTELUKAST SODIUM 10 MG PO TABS
10.0000 mg | ORAL_TABLET | Freq: Every day | ORAL | Status: DC
  Administered 2023-10-12 – 2023-10-18 (×7): 10 mg via ORAL
  Filled 2023-10-12 (×7): qty 1

## 2023-10-12 MED ORDER — HYDROXYCHLOROQUINE SULFATE 200 MG PO TABS: 200.0000 mg | ORAL_TABLET | Freq: Every day | ORAL | Status: DC

## 2023-10-12 MED ORDER — MULTIVITAMINS PO CAPS: 1.0000 | ORAL_CAPSULE | Freq: Every day | ORAL | Status: DC

## 2023-10-12 MED ORDER — ATORVASTATIN CALCIUM 10 MG PO TABS
20.0000 mg | ORAL_TABLET | Freq: Every day | ORAL | Status: DC
  Administered 2023-10-13 – 2023-10-19 (×7): 20 mg via ORAL
  Filled 2023-10-12 (×7): qty 2

## 2023-10-12 MED ORDER — METOPROLOL TARTRATE 25 MG PO TABS
25.0000 mg | ORAL_TABLET | Freq: Every day | ORAL | Status: DC
  Administered 2023-10-12 – 2023-10-13 (×2): 25 mg via ORAL
  Filled 2023-10-12 (×2): qty 1

## 2023-10-12 MED ORDER — ONDANSETRON HCL 4 MG/2ML IJ SOLN
4.0000 mg | Freq: Four times a day (QID) | INTRAMUSCULAR | Status: DC | PRN
  Administered 2023-10-19: 4 mg via INTRAVENOUS
  Filled 2023-10-12: qty 2

## 2023-10-12 MED ORDER — SERTRALINE HCL 25 MG PO TABS
50.0000 mg | ORAL_TABLET | Freq: Every day | ORAL | Status: DC
  Administered 2023-10-13 – 2023-10-19 (×7): 50 mg via ORAL
  Filled 2023-10-12 (×7): qty 2

## 2023-10-12 MED ORDER — VANCOMYCIN HCL 1750 MG/350ML IV SOLN
1750.0000 mg | Freq: Once | INTRAVENOUS | Status: AC
  Administered 2023-10-12: 1750 mg via INTRAVENOUS
  Filled 2023-10-12: qty 350

## 2023-10-12 MED ORDER — METOPROLOL TARTRATE 25 MG PO TABS: 25.0000 mg | ORAL_TABLET | Freq: Every day | ORAL | Status: DC

## 2023-10-12 MED ORDER — SODIUM CHLORIDE 0.9 % IV SOLN
2.0000 g | Freq: Three times a day (TID) | INTRAVENOUS | Status: DC
  Administered 2023-10-12 – 2023-10-16 (×12): 2 g via INTRAVENOUS
  Filled 2023-10-12 (×12): qty 12.5

## 2023-10-12 MED ORDER — SENNOSIDES-DOCUSATE SODIUM 8.6-50 MG PO TABS: 1.0000 | ORAL_TABLET | Freq: Every evening | ORAL | Status: DC | PRN

## 2023-10-12 MED ORDER — POTASSIUM CHLORIDE CRYS ER 20 MEQ PO TBCR
40.0000 meq | EXTENDED_RELEASE_TABLET | Freq: Once | ORAL | Status: AC
  Administered 2023-10-12: 40 meq via ORAL
  Filled 2023-10-12: qty 2

## 2023-10-12 MED ORDER — ARFORMOTEROL TARTRATE 15 MCG/2ML IN NEBU
15.0000 ug | INHALATION_SOLUTION | Freq: Two times a day (BID) | RESPIRATORY_TRACT | Status: DC
  Administered 2023-10-13 – 2023-10-19 (×11): 15 ug via RESPIRATORY_TRACT
  Filled 2023-10-12 (×11): qty 2

## 2023-10-12 MED ORDER — METRONIDAZOLE 500 MG/100ML IV SOLN
500.0000 mg | Freq: Once | INTRAVENOUS | Status: AC
  Administered 2023-10-12: 500 mg via INTRAVENOUS
  Filled 2023-10-12: qty 100

## 2023-10-12 MED ORDER — ADULT MULTIVITAMIN W/MINERALS CH
1.0000 | ORAL_TABLET | Freq: Every day | ORAL | Status: DC
  Administered 2023-10-13 – 2023-10-19 (×8): 1 via ORAL
  Filled 2023-10-12 (×8): qty 1

## 2023-10-12 MED ORDER — SODIUM CHLORIDE 0.9 % IV SOLN
2.0000 g | Freq: Once | INTRAVENOUS | Status: DC
  Filled 2023-10-12: qty 20

## 2023-10-12 MED ORDER — UMECLIDINIUM BROMIDE 62.5 MCG/ACT IN AEPB
1.0000 | INHALATION_SPRAY | Freq: Every day | RESPIRATORY_TRACT | Status: DC
  Administered 2023-10-13 – 2023-10-19 (×7): 1 via RESPIRATORY_TRACT
  Filled 2023-10-12: qty 7

## 2023-10-12 MED ORDER — MAGNESIUM SULFATE 2 GM/50ML IV SOLN
2.0000 g | Freq: Once | INTRAVENOUS | Status: AC
  Administered 2023-10-12: 2 g via INTRAVENOUS
  Filled 2023-10-12: qty 50

## 2023-10-12 MED ORDER — SODIUM CHLORIDE 0.9 % IV BOLUS (SEPSIS)
500.0000 mL | Freq: Once | INTRAVENOUS | Status: AC
  Administered 2023-10-12: 500 mL via INTRAVENOUS

## 2023-10-12 MED ORDER — SODIUM CHLORIDE 0.9 % IV BOLUS (SEPSIS)
1000.0000 mL | Freq: Once | INTRAVENOUS | Status: AC
  Administered 2023-10-12: 1000 mL via INTRAVENOUS

## 2023-10-12 MED ORDER — ACETAMINOPHEN 325 MG PO TABS: 650.0000 mg | ORAL_TABLET | Freq: Four times a day (QID) | ORAL | Status: DC | PRN

## 2023-10-12 MED ORDER — ONDANSETRON HCL 4 MG PO TABS: 4.0000 mg | ORAL_TABLET | Freq: Four times a day (QID) | ORAL | Status: DC | PRN

## 2023-10-12 NOTE — ED Triage Notes (Signed)
[facility] reports pt coming from home. Family c/o fatigue. Family states it has been going on for a day. Pt without complaints. Pt lives with grandson.

## 2023-10-12 NOTE — Progress Notes (Signed)
[facility] is following this code sepsis ?

## 2023-10-12 NOTE — ED Notes (Signed)
ED TO INPATIENT HANDOFF REPORT  ED Nurse Name and Phone #: [phone]  S Name/Age/Gender [name] 62 y.o. female Room/Bed: 032C/032C  Code Status   Code Status: Do not attempt resuscitation (DNR) PRE-ARREST INTERVENTIONS DESIRED  Home/SNF/Other Home Patient oriented to: self, place, time, and situation Is this baseline? Yes   Triage Complete: Triage complete  Chief Complaint Neutropenic fever (HCC) [D70.9, R50.81]  Triage Note [facility] reports pt coming from home. Family c/o fatigue. Family states it has been going on for a day. Pt without complaints. Pt lives with grandson.   Allergies Allergies  Allergen Reactions   Codeine Other (See Comments)    Avoids-felt bad when taken before Pt claims she can have this but does not like to take    Level of Care/Admitting Diagnosis ED Disposition     ED Disposition  Admit   Condition  --   Comment  Hospital Area: [facility] [[id number]]  Level of Care: Telemetry Medical [104]  May admit patient to [facility] or [facility] if equivalent level of care is available:: No  Covid Evaluation: Confirmed COVID Negative  Diagnosis: Neutropenic fever [facility]) [[id number]]  Admitting Physician: Steffanie Rainwater [[id number]]  Attending Physician: Steffanie Rainwater [[id number]]  Certification:: I certify this patient will need inpatient services for at least 2 midnights  Expected Medical Readiness: 10/14/2023          B Medical/Surgery History Past Medical History:  Diagnosis Date   Acute on chronic respiratory failure (HCC) 10/09/2016   Allergic rhinitis    Breast cancer (HCC) 2016   right breast   COPD, mild (HCC) FOLLOWED BY DR [name]   Depression    GERD (gastroesophageal reflux disease)    HTN (hypertension)    Hypertension    Iron deficiency anemia    Long-term current use of steroids SYMBICORT INHALER   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    Overweight (BMI 25.0-29.9) 05/11/2016   Palpitations    Personal history of  radiation therapy 2016   Sarcoidosis STABLE PER CXR JUNE 2013   Shortness of breath    Sickle cell trait (HCC)    Past Surgical History:  Procedure Laterality Date   ADJUSTABLE SUTURE MANIPULATION  05/22/2012   Procedure: ADJUSTABLE SUTURE MANIPULATION;  Surgeon: Corinda Gubler, MD;  Location: [facility];  Service: Ophthalmology;  Laterality: Right;   BREAST LUMPECTOMY Right 2016   BRONCHIAL BIOPSY  07/09/2023   Procedure: BRONCHIAL BIOPSIES;  Surgeon: Leslye Peer, MD;  Location: [facility] ENDOSCOPY;  Service: Pulmonary;;   BRONCHIAL BRUSHINGS  07/09/2023   Procedure: BRONCHIAL BRUSHINGS;  Surgeon: Leslye Peer, MD;  Location: [facility] ENDOSCOPY;  Service: Pulmonary;;   BRONCHIAL NEEDLE ASPIRATION BIOPSY  07/09/2023   Procedure: BRONCHIAL NEEDLE ASPIRATION BIOPSIES;  Surgeon: Leslye Peer, MD;  Location: [facility] ENDOSCOPY;  Service: Pulmonary;;   CESAREAN SECTION  1986   W/ BILATERAL TUBAL LIGATION   COLONOSCOPY WITH PROPOFOL N/A 02/21/2019   Procedure: COLONOSCOPY WITH PROPOFOL;  Surgeon: Hilarie Fredrickson, MD;  Location: [facility] ENDOSCOPY;  Service: Endoscopy;  Laterality: N/A;   EYE SURGERY     IR IMAGING GUIDED PORT INSERTION  08/01/2023   MEDIAN RECTUS REPAIR  05/22/2012   Procedure: MEDIAN RECTUS REPAIR;  Surgeon: Corinda Gubler, MD;  Location: [facility];  Service: Ophthalmology;  Laterality: Bilateral;  INFERIOR RECTUS RESECTION WITH ADJUSTIBLE SUTURES RIGHT EYE    POLYPECTOMY  02/21/2019   Procedure: POLYPECTOMY;  Surgeon: Hilarie Fredrickson, MD;  Location: [facility] ENDOSCOPY;  Service: Endoscopy;;   RADIOACTIVE SEED GUIDED PARTIAL MASTECTOMY WITH AXILLARY SENTINEL LYMPH NODE BIOPSY Right 08/18/2015   Procedure: RADIOACTIVE SEED GUIDED PARTIAL MASTECTOMY WITH AXILLARY SENTINEL LYMPH NODE BIOPSY;  Surgeon: Almond Lint, MD;  Location: [facility];  Service: General;  Laterality: Right;   TOTAL ROBOTIC ASSISTED LAPAROSCOPIC HYSTERECTOMY  12-30-2010   SYMPTOMATIC  UTERINE FIBROIDS   UPPER TEETH EXTRACTION'S  1992   VIDEO BRONCHOSCOPY WITH RADIAL ENDOBRONCHIAL ULTRASOUND  07/09/2023   Procedure: VIDEO BRONCHOSCOPY WITH RADIAL ENDOBRONCHIAL ULTRASOUND;  Surgeon: Leslye Peer, MD;  Location: [facility] ENDOSCOPY;  Service: Pulmonary;;     A IV Location/Drains/Wounds Patient Lines/Drains/Airways Status     Active Line/Drains/Airways     Name Placement date Placement time Site Days   Implanted Port 08/01/23 Right Chest 08/01/23  1141  Chest  72   Peripheral IV 10/12/23 22 G 1" Right Antecubital 10/12/23  0815  Antecubital  less than 1   Peripheral IV 10/12/23 Posterior;Right Hand 10/12/23  1214  Hand  less than 1   Peripheral IV 10/12/23 20 G Left;Posterior Hand 10/12/23  1217  Hand  less than 1   Wound / Incision (Open or Dehisced) 08/01/23 Puncture Neck Right 08/01/23  1138  Neck  72            Intake/Output Last 24 hours No intake or output data in the 24 hours ending 10/12/23 1443  Labs/Imaging Results for orders placed or performed during the hospital encounter of 10/12/23 (from the past 48 hour(s))  Resp panel by RT-PCR (RSV, Flu A&B, Covid) Anterior Nasal Swab     Status: None   Collection Time: 10/12/23  8:29 AM   Specimen: Anterior Nasal Swab  Result Value Ref Range   SARS Coronavirus 2 by RT PCR NEGATIVE NEGATIVE   Influenza A by PCR NEGATIVE NEGATIVE   Influenza B by PCR NEGATIVE NEGATIVE    Comment: (NOTE) The Xpert Xpress SARS-CoV-2/FLU/RSV plus assay is intended as an aid in the diagnosis of influenza from Nasopharyngeal swab specimens and should not be used as a sole basis for treatment. Nasal washings and aspirates are unacceptable for Xpert Xpress SARS-CoV-2/FLU/RSV testing.  Fact Sheet for Patients: BloggerCourse.com  Fact Sheet for Healthcare Providers: SeriousBroker.it  This test is not yet approved or cleared by the [place] FDA and has been authorized for  detection and/or diagnosis of SARS-CoV-2 by FDA under an Emergency Use Authorization (EUA). This EUA will remain in effect (meaning this test can be used) for the duration of the COVID-19 declaration under Section 564(b)(1) of the Act, 21 U.S.C. section 360bbb-3(b)(1), unless the authorization is terminated or revoked.     Resp Syncytial Virus by PCR NEGATIVE NEGATIVE    Comment: (NOTE) Fact Sheet for Patients: BloggerCourse.com  Fact Sheet for Healthcare Providers: SeriousBroker.it  This test is not yet approved or cleared by the [place] FDA and has been authorized for detection and/or diagnosis of SARS-CoV-2 by FDA under an Emergency Use Authorization (EUA). This EUA will remain in effect (meaning this test can be used) for the duration of the COVID-19 declaration under Section 564(b)(1) of the Act, 21 U.S.C. section 360bbb-3(b)(1), unless the authorization is terminated or revoked.  Performed at [facility], [address] [place], [place] [address]   Comprehensive metabolic panel     Status: Abnormal   Collection Time: 10/12/23  8:45 AM  Result Value Ref Range   Sodium 141 135 - 145 mmol/L  Potassium 3.0 (L) 3.5 - 5.1 mmol/L   Chloride 93 (L) 98 - 111 mmol/L   CO2 35 (H) 22 - 32 mmol/L   Glucose, Bld 76 70 - 99 mg/dL    Comment: Glucose reference range applies only to samples taken after fasting for at least 8 hours.   BUN 12 8 - 23 mg/dL   Creatinine, Ser 1.61 0.44 - 1.00 mg/dL   Calcium 7.5 (L) 8.9 - 10.3 mg/dL   Total Protein 5.1 (L) 6.5 - 8.1 g/dL   Albumin 2.5 (L) 3.5 - 5.0 g/dL   AST 21 15 - 41 U/L   ALT 13 0 - 44 U/L   Alkaline Phosphatase 47 38 - 126 U/L   Total Bilirubin 1.4 (H) <1.2 mg/dL   GFR, Estimated >09 >60 mL/min    Comment: (NOTE) Calculated using the CKD-EPI Creatinine Equation (2021)    Anion gap 13 5 - 15    Comment: Performed at [facility], [address],  [place], [place] [address]  CBC with Differential     Status: Abnormal   Collection Time: 10/12/23  8:45 AM  Result Value Ref Range   WBC 0.9 (LL) 4.0 - 10.5 K/uL    Comment: REPEATED TO VERIFY THIS CRITICAL RESULT HAS VERIFIED AND BEEN CALLED TO Reine Just, RN BY LEAH KLAR ON 11 29 2024 AT 0946, AND HAS BEEN READ BACK.     RBC 3.19 (L) 3.87 - 5.11 MIL/uL   Hemoglobin 8.5 (L) 12.0 - 15.0 g/dL   HCT 81.1 (L) 91.4 - 78.2 %   MCV 85.3 80.0 - 100.0 fL   MCH 26.6 26.0 - 34.0 pg   MCHC 31.3 30.0 - 36.0 g/dL   RDW 95.6 (H) 21.3 - 08.6 %   Platelets 21 (LL) 150 - 400 K/uL    Comment: Immature Platelet Fraction may be clinically indicated, consider ordering this additional test [id number] REPEATED TO VERIFY THIS CRITICAL RESULT HAS VERIFIED AND BEEN CALLED TO CThreasa Beards, RN BY LEAH KLAR ON 11 29 2024 AT 0946, AND HAS BEEN READ BACK.     nRBC 0.0 0.0 - 0.2 %   Neutrophils Relative % 70 %   Neutro Abs 0.6 (L) 1.7 - 7.7 K/uL   Lymphocytes Relative 12 %   Lymphs Abs 0.1 (L) 0.7 - 4.0 K/uL   Monocytes Relative 12 %   Monocytes Absolute 0.1 0.1 - 1.0 K/uL   Eosinophils Relative 5 %   Eosinophils Absolute 0.0 0.0 - 0.5 K/uL   Basophils Relative 1 %   Basophils Absolute 0.0 0.0 - 0.1 K/uL   WBC Morphology MORPHOLOGY UNREMARKABLE    RBC Morphology MORPHOLOGY UNREMARKABLE    Smear Review MORPHOLOGY UNREMARKABLE    Immature Granulocytes 0 %   Abs Immature Granulocytes 0.00 0.00 - 0.07 K/uL    Comment: Performed at [facility], [address] [place], [place] [address]  Protime-INR     Status: Abnormal   Collection Time: 10/12/23  8:45 AM  Result Value Ref Range   Prothrombin Time 18.9 (H) 11.4 - 15.2 seconds   INR 1.6 (H) 0.8 - 1.2    Comment: (NOTE) INR goal varies based on device and disease states. Performed at [facility], [address] [place], [place] [address]   APTT     Status: None   Collection Time: 10/12/23  8:45 AM  Result Value Ref Range   aPTT 36 24 - 36 seconds  Comment: Performed at [facility], [address], [place], [place] [address]  I-Stat Lactic Acid, ED     Status: None   Collection Time: 10/12/23  9:06 AM  Result Value Ref Range   Lactic Acid, Venous 1.2 0.5 - 1.9 mmol/L  Urinalysis, w/ Reflex to Culture (Infection Suspected) -Urine, Clean Catch     Status: Abnormal   Collection Time: 10/12/23  9:30 AM  Result Value Ref Range   Specimen Source URINE, CLEAN CATCH    Color, Urine YELLOW YELLOW   APPearance HAZY (A) CLEAR   Specific Gravity, Urine 1.010 1.005 - 1.030   pH 7.0 5.0 - 8.0   Glucose, UA NEGATIVE NEGATIVE mg/dL   Hgb urine dipstick MODERATE (A) NEGATIVE   Bilirubin Urine NEGATIVE NEGATIVE   Ketones, ur 5 (A) NEGATIVE mg/dL   Protein, ur 30 (A) NEGATIVE mg/dL   Nitrite NEGATIVE NEGATIVE   Leukocytes,Ua NEGATIVE NEGATIVE   RBC / HPF 6-10 0 - 5 RBC/hpf   WBC, UA 0-5 0 - 5 WBC/hpf    Comment:        Reflex urine culture not performed if WBC <=10, OR if Squamous epithelial cells >5. If Squamous epithelial cells >5 suggest recollection.    Bacteria, UA RARE (A) NONE SEEN   Squamous Epithelial / HPF 6-10 0 - 5 /HPF   Mucus PRESENT     Comment: Performed at [facility], [address], [place], [place] [address]   *Note: Due to a large number of results and/or encounters for the requested time period, some results have not been displayed. A complete set of results can be found in Results Review.   DG Chest Port 1 View  Result Date: 10/12/2023 CLINICAL DATA:  Possible sepsis. EXAM: PORTABLE CHEST 1 VIEW COMPARISON:  09/17/2023 and older exams.  CT, 09/19/2023. FINDINGS: Cardiac silhouette is normal in size. No convincing mediastinal or hilar masses. Right anterior chest wall Port-A-Cath is stable. Right-sided volume loss with mediastinal shift, stable. There is an ill-defined masslike opacity in the right upper lobe, unchanged. Stable interstitial type opacities right mid lung and both lung bases. No new lung  abnormalities. No convincing pleural effusion.  No pneumothorax. Skeletal structures are grossly intact. Surgical vascular clips on the right reflect prior breast surgery, also stable IMPRESSION: 1. No significant change from the recent prior chest radiograph. 2. 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 3. Right-sided volume loss with mediastinal shift. 4. No new abnormalities. Electronically Signed   By: Amie Portland M.D.   On: 10/12/2023 08:45    Pending Labs Unresulted Labs (From admission, onward)     Start     Ordered   10/12/23 1119  Magnesium  Once,   STAT        10/12/23 1118   10/12/23 0845  Pathologist smear review  Once,   STAT        10/12/23 0845   10/12/23 0843  C Difficile Quick Screen w PCR reflex  (C Difficile quick screen w PCR reflex panel )  Once, for 24 hours,   URGENT       References:    CDiff Information Tool   10/12/23 0842   10/12/23 0829  Blood Culture (routine x 2)  (Septic presentation on arrival (screening labs, nursing and treatment orders for obvious sepsis))  BLOOD CULTURE X 2,   STAT      10/12/23 6440  Vitals/Pain Today's Vitals   10/12/23 1200 10/12/23 1230 10/12/23 1300 10/12/23 1330  BP: (!) 154/90 131/65 (!) 150/92 (!) 144/72  Pulse: (!) 110 99 (!) 107 (!) 53  Resp: 15 15 14 13   Temp: 98.8 F (37.1 C)     TempSrc:      SpO2: 100% 100% 100% 100%  PainSc:        Isolation Precautions Enteric precautions (UV disinfection)  Medications Medications  sodium chloride flush (NS) 0.9 % injection 10 mL (10 mLs Intravenous Not Given 10/12/23 1056)  potassium chloride 10 mEq in 100 mL IVPB (10 mEq Intravenous New Bag/Given 10/12/23 1406)  vancomycin (VANCOREADY) IVPB 1750 mg/350 mL (1,750 mg Intravenous New Bag/Given 10/12/23 1406)  acetaminophen (TYLENOL) tablet 650 mg (has no administration in time range)    Or  acetaminophen (TYLENOL) suppository 650 mg (has no administration in time range)   senna-docusate (Senokot-S) tablet 1 tablet (has no administration in time range)  ondansetron (ZOFRAN) tablet 4 mg (has no administration in time range)    Or  ondansetron (ZOFRAN) injection 4 mg (has no administration in time range)  sodium chloride 0.9 % bolus 1,000 mL (0 mLs Intravenous Stopped 10/12/23 1002)    And  sodium chloride 0.9 % bolus 1,000 mL (0 mLs Intravenous Stopped 10/12/23 1109)    And  sodium chloride 0.9 % bolus 500 mL (0 mLs Intravenous Stopped 10/12/23 0928)  metroNIDAZOLE (FLAGYL) IVPB 500 mg (0 mg Intravenous Stopped 10/12/23 1149)  piperacillin-tazobactam (ZOSYN) IVPB 3.375 g (0 g Intravenous Stopped 10/12/23 1312)  potassium chloride SA (KLOR-CON M) CR tablet 40 mEq (40 mEq Oral Given 10/12/23 1204)    Mobility manual wheelchair     Focused Assessments    R Recommendations: See Admitting Provider Note  Report given to:   Additional Notes:

## 2023-10-12 NOTE — H&P (Signed)
History and Physical    Patient: [name]:[id number] DOB: 1961-01-20 DOA: 10/12/2023 DOS: the patient was seen and examined on 10/12/2023 PCP: [name]., FNP  Patient coming from: Home  Chief Complaint:  Chief Complaint  Patient presents with   Fatigue   HPI: [name] is a 62 y.o. 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 Patient reports that she has had fatigue over the last few weeks.  She had chemotherapy on 11/23. Three days ago, she started having diarrhea with 3-4 watery stools per day. Everything she ate ran through her so she has had decreased p.o. intake over the last few days. She has generalized weakness, chills and abdominal discomfort but denies any nausea, vomiting, palpitations, chest pain, shortness of breath, bloody stools, cough or hematuria.  ED course: Tachycardic with HR in the 110s, febrile with a temp of 100.6, normal RR, O2 sat 100% on 4 L [place]. Labs showed WBC 0.9, Hgb 8.5, platelet 21, K+ 3.0, sodium 141, creatinine 0.88, calcium 7.5, BUN 2.5, PT/INR 18.9/1.6, lactic acid 1.2, magnesium 1.0, negative C. difficile panel, negative COVID and flu test, UA with moderate hemoglobinuria but no evidence of infection. CXR with no acute cardiopulmonary disease Sepsis protocol activated in the ED, patient received IV Zosyn and Flagyl and vancomycin TRH consulted for admission  Review of Systems: As mentioned in the history of present illness. All other systems reviewed and are negative. Past Medical History:  Diagnosis Date   Acute on chronic respiratory failure (HCC) 10/09/2016   Allergic rhinitis    Breast cancer (HCC) 2016   right breast   COPD, mild (HCC) FOLLOWED BY DR  [id number]   Depression    GERD (gastroesophageal reflux disease)    HTN (hypertension)    Hypertension    Iron deficiency anemia    Long-term current use of steroids SYMBICORT INHALER   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    Overweight (BMI 25.0-29.9) 05/11/2016   Palpitations    Personal history of radiation therapy 2016   Sarcoidosis STABLE PER CXR JUNE 2013   Shortness of breath    Sickle cell trait (HCC)    Past Surgical History:  Procedure Laterality Date   ADJUSTABLE SUTURE MANIPULATION  05/22/2012   Procedure: ADJUSTABLE SUTURE MANIPULATION;  Surgeon: Corinda Gubler, MD;  [facility];  Service: Ophthalmology;  Laterality: Right;   BREAST LUMPECTOMY Right 2016   BRONCHIAL BIOPSY  07/09/2023   Procedure: BRONCHIAL BIOPSIES;  Surgeon: Leslye Peer, MD;  Location: [facility] ENDOSCOPY;  Service: Pulmonary;;   BRONCHIAL BRUSHINGS  07/09/2023   Procedure: BRONCHIAL BRUSHINGS;  Surgeon: Leslye Peer, MD;  Location: [facility] ENDOSCOPY;  Service: Pulmonary;;   BRONCHIAL NEEDLE ASPIRATION BIOPSY  07/09/2023   Procedure: BRONCHIAL NEEDLE ASPIRATION BIOPSIES;  Surgeon: Leslye Peer, MD;  Location: [facility] ENDOSCOPY;  Service: Pulmonary;;   CESAREAN SECTION  1986   W/ BILATERAL TUBAL LIGATION   COLONOSCOPY WITH PROPOFOL N/A 02/21/2019   Procedure: COLONOSCOPY WITH PROPOFOL;  Surgeon: Hilarie Fredrickson, MD;  Location: [facility] ENDOSCOPY;  Service: Endoscopy;  Laterality: N/A;   EYE SURGERY     IR IMAGING GUIDED PORT INSERTION  08/01/2023   MEDIAN RECTUS REPAIR  05/22/2012   Procedure: MEDIAN RECTUS REPAIR;  Surgeon: Corinda Gubler, MD;  Location[facility];  Service: Ophthalmology;  Laterality: Bilateral;  INFERIOR RECTUS RESECTION WITH ADJUSTIBLE SUTURES RIGHT EYE    POLYPECTOMY  02/21/2019   Procedure: POLYPECTOMY;  Surgeon: Hilarie Fredrickson, MD;  Location: [facility] ENDOSCOPY;  Service: Endoscopy;;   RADIOACTIVE SEED GUIDED PARTIAL MASTECTOMY WITH AXILLARY SENTINEL LYMPH NODE BIOPSY Right  08/18/2015   Procedure: RADIOACTIVE SEED GUIDED PARTIAL MASTECTOMY WITH AXILLARY SENTINEL LYMPH NODE BIOPSY;  Surgeon: Almond Lint, MD;  Location: [facility];  Service: General;  Laterality: Right;   TOTAL ROBOTIC ASSISTED LAPAROSCOPIC HYSTERECTOMY  12-30-2010   SYMPTOMATIC UTERINE FIBROIDS   UPPER TEETH EXTRACTION'S  1992   VIDEO BRONCHOSCOPY WITH RADIAL ENDOBRONCHIAL ULTRASOUND  07/09/2023   Procedure: VIDEO BRONCHOSCOPY WITH RADIAL ENDOBRONCHIAL ULTRASOUND;  Surgeon: Leslye Peer, MD;  Location: [facility] ENDOSCOPY;  Service: Pulmonary;;   Social History:  reports that she quit smoking about 19 years ago. Her smoking use included cigarettes. She started smoking about 39 years ago. She has a 20 pack-year smoking history. She has never used smokeless tobacco. She reports that she does not drink alcohol and does not use drugs.  Allergies  Allergen Reactions   Codeine Other (See Comments)    Avoids-felt bad when taken before Pt claims she can have this but does not like to take    Family History  Problem Relation Age of Onset   Hyperlipidemia Mother    Asthma Mother    Lupus Mother    Breast cancer Mother 34   Hypertension Father    Hyperlipidemia Father    Hypertension Sister    Hypertension Brother    Cancer Neg Hx    Diabetes Neg Hx    Coronary artery disease Neg Hx     Prior to Admission medications   Medication Sig Start Date End Date Taking? Authorizing Provider  acetaminophen (TYLENOL) 325 MG tablet Take 650 mg by mouth every 6 (six) hours as needed for mild pain or moderate pain.   Yes [provider]  albuterol (VENTOLIN HFA) 108 (90 Base) MCG/ACT inhaler INHALE 2 PUFFS EVERY 6 HOURS AS NEEDED FOR WHEEZING OR SHORTNESS OF BREATH Patient taking differently: Inhale 2 puffs into the lungs every 6 (six) hours as needed for shortness of breath or wheezing. 09/23/19  Yes Nyoka Cowden, MD  aspirin 81 MG chewable tablet Chew 1 tablet (81 mg total) by mouth  daily. 09/22/23  Yes Swayze, Ava, DO  atorvastatin (LIPITOR) 20 MG tablet TAKE 1 TABLET (20 MG TOTAL) BY MOUTH DAILY. 03/28/21  Yes Sandre Kitty, MD  dexamethasone (DECADRON) 4 MG tablet Take 1 tablet (4 mg total) by mouth every 6 (six) hours. 09/21/23  Yes Swayze, Ava, DO  fluticasone (FLONASE) 50 MCG/ACT nasal spray USE 2 SPRAYS IN BOTH  NOSTRILS DAILY AS NEEDED  FOR ALLERGIES Patient taking differently: Place 2 sprays into both nostrils daily as needed for allergies. 08/15/21  Yes Nyoka Cowden, MD  hydroxychloroquine (PLAQUENIL) 200 MG tablet Take 200 mg by mouth daily. 09/24/23  Yes [provider]  levETIRAcetam (KEPPRA) 750 MG tablet Take 1 tablet (750 mg total) by mouth 2 (two) times daily. 09/21/23  Yes Swayze, Ava, DO  metoprolol tartrate (LOPRESSOR) 25 MG tablet Take 1 tablet (25 mg total) by mouth daily. 07/09/23  Yes Leslye Peer, MD  montelukast (SINGULAIR) 10 MG tablet Take 10 mg by mouth at bedtime. 07/31/22  Yes [provider]  Multiple Vitamin (MULTIVITAMIN) capsule  Take 1 capsule by mouth daily with breakfast.   Yes [provider]  omeprazole (PRILOSEC) 20 MG capsule TAKE 1 CAPSULE EVERY DAY Patient taking differently: Take 20 mg by mouth daily before breakfast. 01/03/21  Yes Sandre Kitty, MD  ondansetron (ZOFRAN) 4 MG tablet Take 1 tablet (4 mg total) by mouth every 6 (six) hours as needed for nausea. 06/28/23  Yes Alwyn Ren, MD  OXYGEN Inhale 2-3 L/min into the lungs See admin instructions. Inhale 2-3 L/min into the lungs CONTINUOUSLY; 2 L/min at rest and 3 L/min if exerted   Yes [provider]  pantoprazole (PROTONIX) 40 MG tablet Take 40 mg by mouth daily. 09/21/23  Yes [provider]  prochlorperazine (COMPAZINE) 10 MG tablet Take 1 tablet (10 mg total) by mouth every 6 (six) hours as needed for nausea or vomiting. 08/01/23  Yes Si Gaul, MD  sertraline (ZOLOFT) 50 MG tablet TAKE 1 TABLET EVERY DAY Patient  taking differently: Take 50 mg by mouth daily. 09/11/20  Yes Sandre Kitty, MD  STIOLTO RESPIMAT 2.5-2.5 MCG/ACT AERS USE 2 INHALATIONS BY MOUTH DAILY 09/17/23  Yes Nyoka Cowden, MD  albuterol (PROVENTIL) (2.5 MG/3ML) 0.083% nebulizer solution Take 3 mLs (2.5 mg total) by nebulization every 6 (six) hours as needed for wheezing or shortness of breath. Patient not taking: Reported on 10/12/2023 12/22/18   Osvaldo Shipper, MD  lidocaine-prilocaine (EMLA) cream Apply to affected area once Patient taking differently: Apply 1 Application topically See admin instructions. Apply to affected area once as directed 08/01/23   Si Gaul, MD    Physical Exam: Vitals:   10/12/23 1200 10/12/23 1230 10/12/23 1300 10/12/23 1330  BP: (!) 154/90 131/65 (!) 150/92 (!) 144/72  Pulse: (!) 110 99 (!) 107 (!) 53  Resp: 15 15 14 13   Temp: 98.8 F (37.1 C)     TempSrc:      SpO2: 100% 100% 100% 100%   General: Pleasant, chronically ill-appearing woman laying in bed. No acute distress. HEENT: [place]/AT. Anicteric sclera CV: RRR. No murmurs, rubs, or gallops. No LE edema Pulmonary: On 2 L [place]. Lungs CTAB. Normal effort. No wheezing or rales. Abdominal: Soft, nondistended. Mild ttp of the lower abdomen. Hypoactive bowel sounds. Extremities: Palpable radial and DP pulses. Normal ROM. Skin: Warm and dry. No obvious rash or lesions. Neuro: A&Ox3. Moves all extremities. Normal sensation to light touch. No focal deficit. Psych: Normal mood and affect  Data Reviewed:  Labs showed WBC 0.9, Hgb 8.5, platelet 21, K+ 3.0, sodium 141, creatinine 0.88, calcium 7.5, BUN 2.5, PT/INR 18.9/1.6, lactic acid 1.2, magnesium 1.0, negative C. difficile panel, COVID and flu test negative, UA with moderate hemoglobinuria but no evidence of infection. CXR with no acute cardiopulmonary disease EKG shows sinus tach with PACs and possible biatrial enlargement  Assessment and Plan: [name] is a 62 y.o. 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   # Neutropenic fever Pt with a history of metastatic lung cancer to the brain and spine currently on chemotherapy and radiation therapies presented with few days of fatigue and diarrhea. She is found to have a fever of 100.6 and a white count of 0.9. Chest x-ray is negative, COVID  and flu test negative C. difficile panel also negative. Patient recently had chemotherapy 6 days ago which is the likely cause of her neutropenic fever. S/p 2.5 L IV NS in the ED. -Continue IV vancomycin and Zosyn -Follow-up CT abdomen/pelvis -Follow-up blood culture -Trend CBC, fever curve  # Metastatic lung cancer Diagnosed in August 2024 with solitary brain metastasis and T1 bone metastasis. Has been receiving radiation therapy since September 2024. Further chemotherapy 10/04/23 with last chemo 10/06/23.  During her last hospitalization, she was evaluated by radiation oncology who recommended Decadron 4 times a day and Keppra for seizure prophylaxis. She reports being more fatigue over the last few days. -Oncology consulted, appreciate recs -Continue seizure prophylaxis with Keppra 750 mg twice daily -Hold Decadron for now -As needed Percocet for pain  # Severe thrombocytopenia Significant drop in platelet from 189 a week ago to 21 on admission.  Patient with no evidence of active bleeding or bruising. Thrombocytopenia secondary to recent chemotherapy. -Trend CBC  # Hypokalemia # Hypomagnesemia Patient with multiple electrolyte abnormalities on admission, K+ 3.0 and magnesium of 1.0, in the setting of recent diarrhea. -Repleting -Follow-up repeat K+ and mag  # COPD #  Sarcoidosis # Chronic hypoxic respiratory failure Patient remains on her baseline 2 L [place].  She denies any shortness of breath or wheezing. -Continue supplemental O2 -Resume home inhaler -Singulair 10 mg daily -Hold Plaquenil for now  # HTN BP elevated with SBP in the 130s to 150s -Metoprolol tartrate 25 mg daily  # GERD -Protonix 40 mg daily  # HLD -Atorvastatin 20 mg daily  # Anxiety and depression -Zoloft 50 mg daily    Advance Care Planning:   Code Status: Do not attempt resuscitation (DNR) PRE-ARREST INTERVENTIONS DESIRED   Consults: Oncology  Family Communication: No family at bedside  Severity of Illness: The appropriate patient status for this patient is INPATIENT. Inpatient status is judged to be reasonable and necessary in order to provide the required intensity of service to ensure the patient's safety. The patient's presenting symptoms, physical exam findings, and initial radiographic and laboratory data in the context of their chronic comorbidities is felt to place them at high risk for further clinical deterioration. Furthermore, it is not anticipated that the patient will be medically stable for discharge from the hospital within 2 midnights of admission.   * I certify that at the point of admission it is my clinical judgment that the patient will require inpatient hospital care spanning beyond 2 midnights from the point of admission due to high intensity of service, high risk for further deterioration and high frequency of surveillance required.*  Author: Steffanie Rainwater, MD 10/12/2023 2:32 PM  For on call review [email or web address].

## 2023-10-12 NOTE — Progress Notes (Signed)
ED Pharmacy Antibiotic Sign Off An antibiotic consult was received from an ED provider for vancomycin per pharmacy dosing for febrile neutropenia. A chart review was completed to assess appropriateness.   The following one time order(s) were placed:  Vancomycin 1750 mg IV x 1   Further antibiotic and/or antibiotic pharmacy consults should be ordered by the admitting provider if indicated.   Thank you for allowing pharmacy to be a part of this patient's care.   Griffin Dakin, [facility]  Clinical Pharmacist 10/12/23 11:59 AM

## 2023-10-12 NOTE — ED Provider Notes (Signed)
[place] EMERGENCY DEPARTMENT AT [facility] Provider Note   CSN: [id number] Arrival date & time: 10/12/23  0865     History  Chief Complaint  Patient presents with   Fatigue    [name] is a 62 y.o. female.  Patient complains of multiple episodes of diarrhea.  Patient is currently being treated by oncology for lung cancer with metastatic disease to her brain.  Patient complains of increasing weakness and fever.  Patient reports that she last had chemotherapy treatment on 11/23.  Patient complains of some lower abdominal soreness.  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  The history is provided by the patient. No language interpreter was used.       Home Medications Prior to Admission medications   Medication Sig Start Date End Date Taking? Authorizing Provider  acetaminophen (TYLENOL) 325 MG tablet Take 650 mg by mouth every 6 (six) hours as needed for mild pain or moderate pain.    [provider]  albuterol (PROVENTIL) (2.5 MG/3ML) 0.083% nebulizer solution Take 3 mLs (2.5 mg total) by nebulization every 6 (six) hours as needed for wheezing or shortness of breath. 12/22/18   Osvaldo Shipper, MD  albuterol (VENTOLIN HFA) 108 (90 Base) MCG/ACT inhaler INHALE 2 PUFFS EVERY 6 HOURS AS NEEDED FOR WHEEZING OR SHORTNESS OF BREATH Patient taking differently: Inhale 2 puffs into the lungs every 6 (six) hours as needed for shortness of breath or wheezing. 09/23/19   Nyoka Cowden, MD  aspirin 81 MG chewable tablet Chew 1 tablet (81 mg total) by mouth daily. 09/22/23   Swayze, Ava, DO  atorvastatin (LIPITOR) 20 MG tablet TAKE 1 TABLET (20 MG TOTAL) BY MOUTH DAILY. 03/28/21   Sandre Kitty, MD  dexamethasone (DECADRON) 4 MG tablet Take 1 tablet (4 mg total) by mouth every 6 (six) hours. 09/21/23   Swayze, Ava, DO  fluticasone (FLONASE) 50 MCG/ACT nasal spray USE 2  SPRAYS IN BOTH  NOSTRILS DAILY AS NEEDED  FOR ALLERGIES Patient taking differently: Place 2 sprays into both nostrils daily as needed for allergies. 08/15/21   Nyoka Cowden, MD  levETIRAcetam (KEPPRA) 750 MG tablet Take 1 tablet (750 mg total) by mouth 2 (two) times daily. 09/21/23   Swayze, Ava, DO  lidocaine-prilocaine (EMLA) cream Apply to affected area once Patient taking differently: Apply 1 Application topically See admin instructions. Apply to affected area once as directed 08/01/23   Si Gaul, MD  metoprolol tartrate (LOPRESSOR) 25 MG tablet Take 1 tablet (25 mg total) by mouth daily. 07/09/23   Leslye Peer, MD  montelukast (SINGULAIR) 10 MG tablet Take 10 mg by mouth at bedtime. 07/31/22   [provider]  Multiple Vitamin (MULTIVITAMIN) capsule Take 1 capsule by mouth daily with breakfast.    [provider]  omeprazole (PRILOSEC) 20 MG capsule TAKE 1 CAPSULE EVERY DAY Patient taking differently: Take 20 mg by mouth daily before breakfast. 01/03/21   Sandre Kitty, MD  ondansetron (ZOFRAN) 4 MG tablet Take 1 tablet (4 mg total) by mouth every 6 (six) hours as needed for nausea. 06/28/23   Alwyn Ren, MD  OXYGEN Inhale 2-3 L/min into the lungs See admin instructions. Inhale 2-3 L/min into the lungs CONTINUOUSLY; 2 L/min at rest and 3 L/min if exerted    [provider]  potassium chloride SA (KLOR-CON M) 20 MEQ tablet Take 1  tablet (20 mEq total) by mouth daily. 09/27/23   Heilingoetter, Cassandra L, PA-C  prochlorperazine (COMPAZINE) 10 MG tablet Take 1 tablet (10 mg total) by mouth every 6 (six) hours as needed for nausea or vomiting. 08/01/23   Si Gaul, MD  sertraline (ZOLOFT) 50 MG tablet TAKE 1 TABLET EVERY DAY Patient taking differently: Take 50 mg by mouth daily. 09/11/20   Sandre Kitty, MD  STIOLTO RESPIMAT 2.5-2.5 MCG/ACT AERS USE 2 INHALATIONS BY MOUTH DAILY 09/17/23   Nyoka Cowden, MD      Allergies    Codeine     Review of Systems   Review of Systems  Gastrointestinal:  Positive for diarrhea and nausea.  Neurological:  Positive for weakness.  All other systems reviewed and are negative.   Physical Exam Updated Vital Signs BP 136/86   Pulse (!) 119   Temp (!) 100.6 F (38.1 C) (Oral)   Resp 19   SpO2 100%  Physical Exam Vitals and nursing note reviewed.  Constitutional:      Appearance: She is well-developed.  HENT:     Head: Normocephalic and atraumatic.     Nose: Nose normal.     Mouth/Throat:     Mouth: Mucous membranes are moist.  Eyes:     Pupils: Pupils are equal, round, and reactive to light.  Cardiovascular:     Rate and Rhythm: Tachycardia present.  Pulmonary:     Effort: Pulmonary effort is normal.  Abdominal:     General: Abdomen is flat. There is no distension.     Tenderness: There is abdominal tenderness.     Comments: Abdomen minimally tender diffusely.  Musculoskeletal:        General: Normal range of motion.     Cervical back: Normal range of motion.  Skin:    General: Skin is warm.  Neurological:     General: No focal deficit present.     Mental Status: She is alert and oriented to person, place, and time.  Psychiatric:        Mood and Affect: Mood normal.     ED Results / Procedures / Treatments   Labs (all labs ordered are listed, but only abnormal results are displayed) Labs Reviewed  COMPREHENSIVE METABOLIC PANEL - Abnormal; Notable for the following components:      Result Value   Potassium 3.0 (*)    Chloride 93 (*)    CO2 35 (*)    Calcium 7.5 (*)    Total Protein 5.1 (*)    Albumin 2.5 (*)    Total Bilirubin 1.4 (*)    All other components within normal limits  CBC WITH DIFFERENTIAL/PLATELET - Abnormal; Notable for the following components:   WBC 0.9 (*)    RBC 3.19 (*)    Hemoglobin 8.5 (*)    HCT 27.2 (*)    RDW 16.4 (*)    Platelets 21 (*)    Neutro Abs 0.6 (*)    Lymphs Abs 0.1 (*)    All other components within normal  limits  PROTIME-INR - Abnormal; Notable for the following components:   Prothrombin Time 18.9 (*)    INR 1.6 (*)    All other components within normal limits  URINALYSIS, W/ REFLEX TO CULTURE (INFECTION SUSPECTED) - Abnormal; Notable for the following components:   APPearance HAZY (*)    Hgb urine dipstick MODERATE (*)    Ketones, ur 5 (*)    Protein, ur 30 (*)    Bacteria,  UA RARE (*)    All other components within normal limits  RESP PANEL BY RT-PCR (RSV, FLU A&B, COVID)  [id number]  CULTURE, BLOOD (ROUTINE X 2)  CULTURE, BLOOD (ROUTINE X 2)  C DIFFICILE QUICK SCREEN W PCR REFLEX    APTT  PATHOLOGIST SMEAR REVIEW  I-STAT CG4 LACTIC ACID, ED  I-STAT CG4 LACTIC ACID, ED    EKG EKG Interpretation Date/Time:  Friday October 12 2023 08:22:52 EST Ventricular Rate:  132 PR Interval:  78 QRS Duration:  110 QT Interval:  316 QTC Calculation: 467 R Axis:   59  Text Interpretation: Sinus tachycardia Atrial premature complexes Abnormal R-wave progression, early transition ST depr, consider ischemia, inferior leads Confirmed by Vonita Moss [id number]) on 10/12/2023 11:03:17 AM  Radiology DG Chest Port 1 View  Result Date: 10/12/2023 CLINICAL DATA:  Possible sepsis. EXAM: PORTABLE CHEST 1 VIEW COMPARISON:  09/17/2023 and older exams.  CT, 09/19/2023. FINDINGS: Cardiac silhouette is normal in size. No convincing mediastinal or hilar masses. Right anterior chest wall Port-A-Cath is stable. Right-sided volume loss with mediastinal shift, stable. There is an ill-defined masslike opacity in the right upper lobe, unchanged. Stable interstitial type opacities right mid lung and both lung bases. No new lung abnormalities. No convincing pleural effusion.  No pneumothorax. Skeletal structures are grossly intact. Surgical vascular clips on the right reflect prior breast surgery, also stable IMPRESSION: 1. No significant change from the recent prior chest radiograph. 2. Right upper lobe masslike opacity  consistent with known malignancy. 3. Right-sided volume loss with mediastinal shift. 4. No new abnormalities. Electronically Signed   By: Amie Portland M.D.   On: 10/12/2023 08:45    Procedures .Critical Care  Performed by: Elson Areas, PA-C Authorized by: Elson Areas, PA-C   Critical care provider statement:    Critical care time (minutes):  30   Critical care start time:  10/12/2023 9:00 AM   Critical care end time:  10/12/2023 11:25 AM   Critical care time was exclusive of:  Separately billable procedures and treating other patients and teaching time   Critical care was necessary to treat or prevent imminent or life-threatening deterioration of the following conditions:  Cardiac failure, circulatory failure, dehydration and sepsis   Critical care was time spent personally by me on the following activities:  Development of treatment plan with patient or surrogate, discussions with consultants, discussions with primary provider, pulse oximetry, re-evaluation of patient's condition, ordering and review of laboratory studies, ordering and performing treatments and interventions and ordering and review of radiographic studies   Care discussed with: admitting provider and accepting provider at another facility       Medications Ordered in ED Medications  sodium chloride flush (NS) 0.9 % injection 10 mL (10 mLs Intravenous Not Given 10/12/23 1056)  cefTRIAXone (ROCEPHIN) 2 g in sodium chloride 0.9 % 100 mL IVPB (2 g Intravenous Patient Refused/Not Given 10/12/23 0921)  piperacillin-tazobactam (ZOSYN) IVPB 3.375 g (has no administration in time range)  sodium chloride 0.9 % bolus 1,000 mL (0 mLs Intravenous Stopped 10/12/23 1002)    And  sodium chloride 0.9 % bolus 1,000 mL (0 mLs Intravenous Stopped 10/12/23 1109)    And  sodium chloride 0.9 % bolus 500 mL (0 mLs Intravenous Stopped 10/12/23 0928)  metroNIDAZOLE (FLAGYL) IVPB 500 mg (500 mg Intravenous New Bag/Given 10/12/23 3664)     ED Course/ Medical Decision Making/ A&P  Medical Decision Making Patient complains of weakness and diarrhea.  Patient is currently receiving chemotherapy for lung cancer with metastatic brain cancer.  Amount and/or Complexity of Data Reviewed External Data Reviewed: notes.    Details: Notes from hospitalization by hospitalist are reviewed Oncology notes reviewed  Labs: ordered. Decision-making details documented in ED Course.    Details: Labs ordered reviewed and interpreted.  White blood cell count is 0.9.  Platelets 21. Patient's potassium is 3.0 Radiology: ordered and independent interpretation performed. Decision-making details documented in ED Course.    Details: Chest x-ray ordered reviewed and interpreted chest x-ray shows pulmonary mass no acute findings. ECG/medicine tests: ordered and independent interpretation performed. Decision-making details documented in ED Course.    Details: KG shows sinus tachycardia Discussion of management or test interpretation with external provider(s): Spoke with oncology Dr.Pasam.  He advised WBC and platelet change expected from treatment.  Transfuse if hemoglobin below 7 or platelets below 20. He advised Vancomycin and zosyn.   Hospitalist consulted for admission   Risk Prescription drug management. Decision regarding hospitalization.  Critical Care Total time providing critical care: 30 minutes    Patient given IV fluid bolus.  Patient given Rocephin and metronidazole initially.  Patient had vancomycin ordered after assessment of laboratory evaluations.       Final Clinical Impression(s) / ED Diagnoses Final diagnoses:  Fever and neutropenia (HCC)  Weakness  Malignant neoplasm of right lung, unspecified part of lung (HCC)  Metastatic malignant neoplasm, unspecified site [facility])    Rx / DC Orders ED Discharge Orders     None         Osie Cheeks 10/12/23 1319    Rondel Baton, MD 10/12/23 [phone]

## 2023-10-12 NOTE — Plan of Care (Signed)

## 2023-10-13 ENCOUNTER — Inpatient Hospital Stay (HOSPITAL_COMMUNITY): Payer: 59

## 2023-10-13 DIAGNOSIS — A419 Sepsis, unspecified organism: Secondary | ICD-10-CM

## 2023-10-13 DIAGNOSIS — D61818 Other pancytopenia: Secondary | ICD-10-CM | POA: Diagnosis not present

## 2023-10-13 DIAGNOSIS — C3491 Malignant neoplasm of unspecified part of right bronchus or lung: Secondary | ICD-10-CM | POA: Diagnosis not present

## 2023-10-13 DIAGNOSIS — D696 Thrombocytopenia, unspecified: Secondary | ICD-10-CM | POA: Diagnosis not present

## 2023-10-13 DIAGNOSIS — D709 Neutropenia, unspecified: Secondary | ICD-10-CM | POA: Diagnosis not present

## 2023-10-13 DIAGNOSIS — R531 Weakness: Secondary | ICD-10-CM

## 2023-10-13 DIAGNOSIS — R6510 Systemic inflammatory response syndrome (SIRS) of non-infectious origin without acute organ dysfunction: Secondary | ICD-10-CM

## 2023-10-13 LAB — CBC WITH DIFFERENTIAL/PLATELET
Abs Immature Granulocytes: 0.12 10*3/uL — ABNORMAL HIGH (ref 0.00–0.07)
Basophils Absolute: 0 10*3/uL (ref 0.0–0.1)
Basophils Relative: 0 %
Eosinophils Absolute: 0 10*3/uL (ref 0.0–0.5)
Eosinophils Relative: 1 %
HCT: 23.3 % — ABNORMAL LOW (ref 36.0–46.0)
Hemoglobin: 7.2 g/dL — ABNORMAL LOW (ref 12.0–15.0)
Immature Granulocytes: 6 %
Lymphocytes Relative: 13 %
Lymphs Abs: 0.2 10*3/uL — ABNORMAL LOW (ref 0.7–4.0)
MCH: 26 pg (ref 26.0–34.0)
MCHC: 30.9 g/dL (ref 30.0–36.0)
MCV: 84.1 fL (ref 80.0–100.0)
Monocytes Absolute: 0.1 10*3/uL (ref 0.1–1.0)
Monocytes Relative: 3 %
Neutro Abs: 1.6 10*3/uL — ABNORMAL LOW (ref 1.7–7.7)
Neutrophils Relative %: 83 %
Platelets: 14 10*3/uL — CL (ref 150–400)
RBC: 2.77 MIL/uL — ABNORMAL LOW (ref 3.87–5.11)
RDW: 16.6 % — ABNORMAL HIGH (ref 11.5–15.5)
WBC: 1.9 10*3/uL — ABNORMAL LOW (ref 4.0–10.5)
nRBC: 0 % (ref 0.0–0.2)
nRBC: 0 /100{WBCs}

## 2023-10-13 LAB — COMPREHENSIVE METABOLIC PANEL
ALT: 14 U/L (ref 0–44)
AST: 15 U/L (ref 15–41)
Albumin: 2.2 g/dL — ABNORMAL LOW (ref 3.5–5.0)
Alkaline Phosphatase: 43 U/L (ref 38–126)
Anion gap: 9 (ref 5–15)
BUN: 11 mg/dL (ref 8–23)
CO2: 30 mmol/L (ref 22–32)
Calcium: 7.1 mg/dL — ABNORMAL LOW (ref 8.9–10.3)
Chloride: 103 mmol/L (ref 98–111)
Creatinine, Ser: 0.72 mg/dL (ref 0.44–1.00)
GFR, Estimated: >60 mL/min (ref >60)
Glucose, Bld: 77 mg/dL (ref 70–99)
Potassium: 2.7 mmol/L — CL (ref 3.5–5.1)
Sodium: 142 mmol/L (ref 135–145)
Total Bilirubin: 0.8 mg/dL (ref <1.2)
Total Protein: 4.8 g/dL — ABNORMAL LOW (ref 6.5–8.1)

## 2023-10-13 LAB — PHOSPHORUS: Phosphorus: 2.5 mg/dL (ref 2.5–4.6)

## 2023-10-13 LAB — MAGNESIUM: Magnesium: 1.6 mg/dL — ABNORMAL LOW (ref 1.7–2.4)

## 2023-10-13 MED ORDER — LOPERAMIDE HCL 2 MG PO CAPS
2.0000 mg | ORAL_CAPSULE | ORAL | Status: DC | PRN
  Administered 2023-10-13 (×2): 2 mg via ORAL
  Filled 2023-10-13 (×2): qty 1

## 2023-10-13 MED ORDER — POTASSIUM CHLORIDE CRYS ER 20 MEQ PO TBCR
40.0000 meq | EXTENDED_RELEASE_TABLET | Freq: Once | ORAL | Status: AC
  Administered 2023-10-13: 40 meq via ORAL
  Filled 2023-10-13: qty 2

## 2023-10-13 MED ORDER — IOHEXOL 300 MG/ML  SOLN
100.0000 mL | Freq: Once | INTRAMUSCULAR | Status: AC | PRN
  Administered 2023-10-13: 75 mL via INTRAVENOUS

## 2023-10-13 MED ORDER — METRONIDAZOLE 500 MG/100ML IV SOLN
500.0000 mg | Freq: Two times a day (BID) | INTRAVENOUS | Status: DC
  Administered 2023-10-13 – 2023-10-16 (×6): 500 mg via INTRAVENOUS
  Filled 2023-10-13 (×6): qty 100

## 2023-10-13 MED ORDER — DEXAMETHASONE 4 MG PO TABS
4.0000 mg | ORAL_TABLET | Freq: Three times a day (TID) | ORAL | Status: DC
  Administered 2023-10-13 – 2023-10-18 (×16): 4 mg via ORAL
  Filled 2023-10-13 (×16): qty 1

## 2023-10-13 MED ORDER — MAGNESIUM SULFATE 2 GM/50ML IV SOLN
2.0000 g | Freq: Once | INTRAVENOUS | Status: AC
  Administered 2023-10-13: 2 g via INTRAVENOUS
  Filled 2023-10-13: qty 50

## 2023-10-13 MED ORDER — VANCOMYCIN HCL IN DEXTROSE 1-5 GM/200ML-% IV SOLN
1000.0000 mg | Freq: Two times a day (BID) | INTRAVENOUS | Status: DC
  Administered 2023-10-13 – 2023-10-14 (×2): 1000 mg via INTRAVENOUS
  Filled 2023-10-13 (×2): qty 200

## 2023-10-13 MED ORDER — HYDRALAZINE HCL 25 MG PO TABS: 25.0000 mg | ORAL_TABLET | Freq: Four times a day (QID) | ORAL | Status: DC | PRN

## 2023-10-13 MED ORDER — POTASSIUM CHLORIDE CRYS ER 20 MEQ PO TBCR
40.0000 meq | EXTENDED_RELEASE_TABLET | ORAL | Status: AC
  Administered 2023-10-13 (×2): 40 meq via ORAL
  Filled 2023-10-13 (×2): qty 2

## 2023-10-13 MED ORDER — METOPROLOL TARTRATE 25 MG PO TABS
25.0000 mg | ORAL_TABLET | Freq: Two times a day (BID) | ORAL | Status: DC
  Administered 2023-10-13 – 2023-10-19 (×12): 25 mg via ORAL
  Filled 2023-10-13 (×12): qty 1

## 2023-10-13 NOTE — Progress Notes (Signed)
PROGRESS NOTE  [name] QMV:[id number] DOB: 1961/04/05   PCP: [name]., FNP  Patient is from: Home.  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  Last chemotherapy on 10/06/2023.  In ED, she was febrile 200.6 and tachycardic to 136.  WBC 0.9 with ANC of 0.6 and ALC of 0.1.  K3.0.  Total bili 1.4.  Hgb 8.5.  Platelet 21 (normal baseline).  COVID-19, influenza and RSV PCR nonreactive.  INR 1.6.  UA and CXR without acute finding.  Lactic acid 1.2.  Mg 1.0.  C. difficile, blood cultures and CT abdomen and pelvis ordered.  Patient was started on vancomycin and cefepime, and admitted with working diagnosis of neutropenic fever and diarrhea.  Subjective: Seen and examined earlier this morning.  No major events overnight of this morning.  Some loose bowel movement this morning.  No complaints other than feeling fatigued.  Denies runny nose, sore throat, chest pain, cough, shortness of breath, nausea, vomiting or abdominal pain.  Denies UTI symptoms.  Objective: Vitals:   10/13/23 0415 10/13/23 0419 10/13/23 0748 10/13/23 0903  BP:  (!) 154/82 (!) 152/79   Pulse:  78 79 84  Resp:  18 18 18   Temp:  98.7 F (37.1 C) 97.8 F (36.6 C)   TempSrc:  Oral Oral   SpO2:  100% 100%   Weight: 72.1 kg     Height:        Examination:  GENERAL: No apparent distress.  Nontoxic. HEENT: MMM.  Vision and hearing grossly intact.  NECK: Supple.  No apparent JVD.  RESP:  No IWOB.  Fair aeration bilaterally. CVS:  RRR. Heart sounds normal.  ABD/GI/GU: BS+. Abd soft, NTND.  MSK/EXT:  Moves extremities.  No apparent deformity. No edema.  SKIN: no apparent skin lesion or wound NEURO: Awake, alert and oriented appropriately.  No apparent focal neuro deficit. PSYCH: Calm. Normal affect.   Procedures:  None  Microbiology summarized: COVID-19, influenza and RSV PCR nonreactive Blood cultures NGTD C. difficile negative  Assessment and plan: Neutropenic fever: Likely due to recent chemotherapy.  Febrile to 100.6 and tachycardic to 136 on arrival.  Infectious workup unrevealing.  Leukocytosis and neutropenia improving.  Patient feels better as well. -Continue IV cefepime -Doubt need for IV vancomycin now neutropenia resolving and patient feeling better. -Follow CT abdomen and pelvis -Trend CBC -Follow oncology recommendation   Pancytopenia: On 11/19, WBC 12, Hgb 9.5 and platelet 189.  Likely due to recent chemotherapy Recent Labs  Lab 10/12/23 0845 10/13/23 0443  WBC 0.9* 1.9*  NEUTROABS 0.6* 1.6*  HGB 8.5* 7.2*  HCT 27.2* 23.3*  MCV 85.3 84.1  PLT 21* 14*  -Continue monitoring -Verbally consented for blood products if indicated. -Defer to hematology/oncology  Stage IV NSCLC with brain and osseous mets-on radiation and chemotherapy.  Last chemo on 11/23. -Continue home Keppra -Resume Decadron at 4 mg 3 times daily.  Takes 4 times daily at home. -Follow oncology recommendation -Pain control  Diarrhea: Likely from chemo.  Abdominal exam benign.  C. difficile negative -Imodium as needed -Follow CT abdomen and pelvis  Hypokalemia/hypomagnesemia: Likely due to diarrhea -Monitor replenish as appropriate  Fatigue: Multifactorial including chemotherapy and acute illness. -PT/OT   Chronic COPD/sarcoidosis/hypoxic RF: On 2 L at baseline.  Stable. -Continue home Singulair and breathing treatments -Agree with holding Plaquenil   Essential hypertension: Normotensive for most part. -Metoprolol 25 mg twice daily.  Not sure why she is taking once daily at home.   GERD -Protonix 40  mg daily   HLD -Atorvastatin 20 mg daily   Anxiety and depression: Stable -Zoloft 50 mg daily  History of breast cancer -Outpatient follow-up  Body mass index is 26.46 kg/m.           DVT prophylaxis:  SCDs Start: 10/12/23 1436  Code Status: DNR/DNI Family Communication: None at bedside Level of care: Telemetry Medical Status is: Inpatient Remains inpatient appropriate because: Neutropenic fever   Final disposition: Likely home Consultants:  Oncology/hematology  55 minutes with more than 50% spent in reviewing records, counseling patient/family and coordinating care.   Sch Meds:  Scheduled Meds:  arformoterol  15 mcg Nebulization BID   And   umeclidinium bromide  1 puff Inhalation Daily   atorvastatin  20 mg Oral Daily   dexamethasone  4 mg Oral Q8H   levETIRAcetam  750 mg Oral BID   metoprolol tartrate  25 mg Oral BID   montelukast  10 mg Oral QHS   multivitamin with minerals  1 tablet Oral Q breakfast   pantoprazole  40 mg Oral Daily   potassium chloride  40 mEq Oral Q4H   sertraline  50 mg Oral Daily   sodium chloride flush  10 mL Intravenous Q12H   Continuous Infusions:  ceFEPime (MAXIPIME) IV 2 g (10/13/23 0610)   PRN Meds:.acetaminophen **OR** acetaminophen, albuterol, hydrALAZINE, loperamide, ondansetron **OR** ondansetron (ZOFRAN) IV, oxyCODONE-acetaminophen, senna-docusate  Antimicrobials: Anti-infectives (From admission, onward)    Start     Dose/Rate Route Frequency Ordered Stop   10/13/23 1000  hydroxychloroquine (PLAQUENIL) tablet 200 mg  Status:  Discontinued        200 mg Oral Daily 10/12/23 1830 10/12/23 1922   10/12/23 2130  ceFEPIme (MAXIPIME) 2 g in sodium chloride 0.9 % 100 mL IVPB        2 g 200 mL/hr over 30 Minutes Intravenous Every 8 hours 10/12/23 2015     10/12/23 1215  vancomycin (VANCOREADY) IVPB 1750 mg/350 mL        1,750 mg 175 mL/hr over 120 Minutes Intravenous  Once 10/12/23 1203 10/12/23 1624   10/12/23 1115   piperacillin-tazobactam (ZOSYN) IVPB 3.375 g        3.375 g 100 mL/hr over 30 Minutes Intravenous  Once 10/12/23 1112 10/12/23 1312   10/12/23 0830  cefTRIAXone (ROCEPHIN) 2 g in sodium chloride 0.9 % 100 mL IVPB  Status:  Discontinued        2 g 200 mL/hr over 30 Minutes Intravenous  Once 10/12/23 0829 10/12/23 1150   10/12/23 0830  metroNIDAZOLE (FLAGYL) IVPB 500 mg        500 mg 100 mL/hr over 60 Minutes Intravenous  Once 10/12/23 0829 10/12/23 1149        I have personally reviewed the following labs and images: CBC: Recent Labs  Lab 10/12/23 0845 10/13/23 0443  WBC 0.9* 1.9*  NEUTROABS 0.6* 1.6*  HGB 8.5* 7.2*  HCT 27.2* 23.3*  MCV 85.3 84.1  PLT 21* 14*   BMP &GFR Recent Labs  Lab 10/12/23 0845 10/12/23 1622 10/13/23 0443  NA 141  --  142  K 3.0*  --  2.7*  CL 93*  --  103  CO2 35*  --  30  GLUCOSE 76  --  77  BUN 12  --  11  CREATININE 0.88  --  0.72  CALCIUM 7.5*  --  7.1*  MG  --  1.0* 1.6*  PHOS  --   --  2.5   Estimated Creatinine Clearance: 71.6 mL/min (by C-G formula based on SCr of 0.72 mg/dL). Liver & Pancreas: Recent Labs  Lab 10/12/23 0845 10/13/23 0443  AST 21 15  ALT 13 14  ALKPHOS 47 43  BILITOT 1.4* 0.8  PROT 5.1* 4.8*  ALBUMIN 2.5* 2.2*   No results for input(s): "LIPASE", "AMYLASE" in the last 168 hours. No results for input(s): "AMMONIA" in the last 168 hours. Diabetic: No results for input(s): "HGBA1C" in the last 72 hours. No results for input(s): "GLUCAP" in the last 168 hours. Cardiac Enzymes: No results for input(s): "CKTOTAL", "CKMB", "CKMBINDEX", "TROPONINI" in the last 168 hours. No results for input(s): "PROBNP" in the last 8760 hours. Coagulation Profile: Recent Labs  Lab 10/12/23 0845  INR 1.6*   Thyroid Function Tests: No results for input(s): "TSH", "T4TOTAL", "FREET4", "T3FREE", "THYROIDAB" in the last 72 hours. Lipid Profile: No results for input(s): "CHOL", "HDL", "LDLCALC", "TRIG", "CHOLHDL",  "LDLDIRECT" in the last 72 hours. Anemia Panel: No results for input(s): "VITAMINB12", "FOLATE", "FERRITIN", "TIBC", "IRON", "RETICCTPCT" in the last 72 hours. Urine analysis:    Component Value Date/Time   COLORURINE YELLOW 10/12/2023 0930   APPEARANCEUR HAZY (A) 10/12/2023 0930   LABSPEC 1.010 10/12/2023 0930   PHURINE 7.0 10/12/2023 0930   GLUCOSEU NEGATIVE 10/12/2023 0930   HGBUR MODERATE (A) 10/12/2023 0930   HGBUR small 05/13/2010 1505   BILIRUBINUR NEGATIVE 10/12/2023 0930   BILIRUBINUR negative 12/08/2019 0000   BILIRUBINUR NEG 12/29/2015 1444   KETONESUR 5 (A) 10/12/2023 0930   PROTEINUR 30 (A) 10/12/2023 0930   UROBILINOGEN 0.2 12/08/2019 0000   UROBILINOGEN 0.2 04/17/2015 1618   NITRITE NEGATIVE 10/12/2023 0930   LEUKOCYTESUR NEGATIVE 10/12/2023 0930   Sepsis Labs: Invalid input(s): "PROCALCITONIN", "LACTICIDVEN"  Microbiology: Recent Results (from the past 240 hour(s))  Resp panel by RT-PCR (RSV, Flu A&B, Covid) Anterior Nasal Swab     Status: None   Collection Time: 10/12/23  8:29 AM   Specimen: Anterior Nasal Swab  Result Value Ref Range Status   SARS Coronavirus 2 by RT PCR NEGATIVE NEGATIVE Final   Influenza A by PCR NEGATIVE NEGATIVE Final   Influenza B by PCR NEGATIVE NEGATIVE Final    Comment: (NOTE) The Xpert Xpress SARS-CoV-2/FLU/RSV plus assay is intended as an aid in the diagnosis of influenza from Nasopharyngeal swab specimens and should not be used as a sole basis for treatment. Nasal washings and aspirates are unacceptable for Xpert Xpress SARS-CoV-2/FLU/RSV testing.  Fact Sheet for Patients: BloggerCourse.com  Fact Sheet for Healthcare Providers: SeriousBroker.it  This test is not yet approved or cleared by the [place] FDA and has been authorized for detection and/or diagnosis of SARS-CoV-2 by FDA under an Emergency Use Authorization (EUA). This EUA will remain in effect (meaning this  test can be used) for the duration of the COVID-19 declaration under Section 564(b)(1) of the Act, 21 U.S.C. section 360bbb-3(b)(1), unless the authorization is terminated or revoked.     Resp Syncytial Virus by PCR NEGATIVE NEGATIVE Final    Comment: (NOTE) Fact Sheet for Patients: BloggerCourse.com  Fact Sheet for Healthcare Providers:  SeriousBroker.it  This test is not yet approved or cleared by the [place] and has been authorized for detection and/or diagnosis of SARS-CoV-2 by FDA under an Emergency Use Authorization (EUA). This EUA will remain in effect (meaning this test can be used) for the duration of the COVID-19 declaration under Section 564(b)(1) of the Act, 21 U.S.C. section 360bbb-3(b)(1), unless the authorization is terminated or revoked.  Performed at [facility], [address] [place], [place] [address]   Blood Culture (routine x 2)     Status: None (Preliminary result)   Collection Time: 10/12/23  8:29 AM   Specimen: BLOOD  Result Value Ref Range Status   Specimen Description BLOOD LEFT ANTECUBITAL  Final   Special Requests   Final    BOTTLES DRAWN AEROBIC AND ANAEROBIC Blood Culture results may not be optimal due to an inadequate volume of blood received in culture bottles   Culture   Final    NO GROWTH < 24 HOURS Performed at [facility], [address], [place], [place] [address]    Report Status PENDING  Incomplete  Blood Culture (routine x 2)     Status: None (Preliminary result)   Collection Time: 10/12/23  8:34 AM   Specimen: BLOOD  Result Value Ref Range Status   Specimen Description BLOOD BLOOD RIGHT HAND  Final   Special Requests   Final    BOTTLES DRAWN AEROBIC AND ANAEROBIC Blood Culture adequate volume   Culture   Final    NO GROWTH < 24 HOURS Performed at [facility], [address] [place], [place] [address]    Report Status PENDING  Incomplete  C  Difficile Quick Screen w PCR reflex     Status: None   Collection Time: 10/12/23  5:57 PM   Specimen: STOOL  Result Value Ref Range Status   C Diff antigen NEGATIVE NEGATIVE Final   C Diff toxin NEGATIVE NEGATIVE Final   C Diff interpretation No C. difficile detected.  Final    Comment: Performed at [facility], [address], [place], [place] [address]    Radiology Studies: No results found.    Maytte Jacot T. Lakesa Coste [facility]  If 7PM-7AM, please contact night-coverage [email or web address] 10/13/2023, 12:13 PM

## 2023-10-13 NOTE — Progress Notes (Signed)
Pharmacy Antibiotic Note  [name] is a 62 y.o. 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  Pharmacy has been consulted for vancomycin dosing; already on cefepime and metronidazole. Liver abscess is suspected.  She is afebrile, WBC are low at 1.9, and renal function is normal. She had vancomycin 1750 mg IV on 11/29 at 14:06.   Plan: Vancomycin 1000 mg IV q12h Goal AUC 400-550, eAUC 532.1, SCr used 0.8, Vd 0.72 Monitor renal function, clinical progress, cultures/sensitivities F/U LOT and de-escalate as able Vancomycin levels as clinically indicated   Height: 5\' 5"  (165.1 cm) Weight: 72.1 kg (159 lb) IBW/kg (Calculated) : 57  Temp (24hrs), Avg:98.1 F (36.7 C), Min:97.5 F (36.4 C), Max:98.7 F (37.1 C)  Recent Labs  Lab 10/12/23 0845 10/12/23 0906 10/13/23 0443  WBC 0.9*  --  1.9*  CREATININE 0.88  --  0.72  LATICACIDVEN  --  1.2  --     Estimated Creatinine Clearance: 71.6 mL/min (by C-G formula based on SCr of 0.72 mg/dL).    Allergies  Allergen Reactions   Codeine Other (See Comments)    Avoids-felt bad when taken before Pt claims she can have this but does not like to take    Antimicrobials this admission: Zosyn x1 11/29 vancomycin 11/29 >>  cefepime 11/29 >>  Metronidazole 11/29 >>  Dose adjustments this admission:   Microbiology results: Cdiff (-) 11/29 Bcx: ngtd 11/29 Resp panel (-)  Thank you for involving pharmacy in this patient's care.  Loura Back, PharmD, BCPS Clinical Pharmacist Clinical phone for 10/13/2023 is [phone] 10/13/2023 7:13 PM

## 2023-10-13 NOTE — Plan of Care (Signed)

## 2023-10-13 NOTE — Progress Notes (Signed)
[facility] night cross cover note:   I was notified by RN of updated lab values this morning that include potassium of 2.7 as well as platelet count of 14, with the latter slightly lower than most recent prior platelet count of 21.  I have ordered potassium chloride 40 meq po x 1 dose now and will defer platelet transfusion to day hospitalist.     Newton Pigg, DO Hospitalist

## 2023-10-13 NOTE — Evaluation (Signed)
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  Last chemotherapy on 10/06/2023.    Patient and admitted with working diagnosis of neutropenic fever and diarrhea.   Clinical Impression   Pt reports ind at baseline with ADLs and uses rollator for mobility, lives with family who can assist at d/c. Pt currently needing set up -min A for ADLs, CGA for transfers with RW. Pt on baseline 2L O2 throughout session. Pt able to [facility] for [facility] and keeps eyes closed frequently throughout session. Pt presenting with impairments listed below, will follow acutely. Recommend HHOT at d/c.        If plan is discharge home, recommend the following: A little help with walking and/or transfers;A little help with bathing/dressing/bathroom;Assistance with cooking/housework;Assist for transportation;Help with stairs or ramp for entrance    Functional Status Assessment  Patient has had a recent decline in their functional status and demonstrates the ability to make significant improvements in function in a reasonable and predictable amount of time.  Equipment Recommendations  BSC/3in1    Recommendations for Other Services PT consult     Precautions / Restrictions Precautions Precautions: Fall Precaution Comments: 2L o2 baseline Restrictions Weight Bearing Restrictions: No      Mobility Bed Mobility               General bed mobility comments: OOB in chair upon arrival    Transfers Overall transfer level: Needs assistance Equipment used: Rolling walker (2  wheels) Transfers: Sit to/from Stand Sit to Stand: Contact guard assist     Step pivot transfers: Contact guard assist            Balance Overall balance assessment: Needs assistance Sitting-balance support: No upper extremity supported, Feet supported Sitting balance-Leahy Scale: Good     Standing balance support: Bilateral upper extremity supported, During functional activity Standing balance-Leahy Scale: Fair Standing balance comment: with RW support                           ADL either performed or assessed with clinical judgement   ADL Overall ADL's : Needs assistance/impaired Eating/Feeding: Set up;Sitting   Grooming: Set up;Sitting   Upper Body Bathing: Minimal assistance;Sitting   Lower Body Bathing: Sitting/lateral leans;Minimal assistance   Upper Body Dressing : Minimal assistance;Sitting   Lower Body Dressing: Minimal assistance;Sitting/lateral leans   Toilet Transfer: Contact guard assist;Ambulation;BSC/3in1;Rolling walker (2 wheels)   Toileting- Clothing Manipulation and Hygiene: Total assistance       Functional mobility during ADLs: Contact guard assist;Rolling walker (2 wheels)       Vision   Vision Assessment?: No apparent visual deficits     Perception Perception: Not tested       Praxis Praxis: Not tested       Pertinent Vitals/Pain Pain Assessment Pain Assessment: Faces Pain Score: 4  Faces Pain Scale: Hurts a little bit Pain Location: stomach Pain Descriptors / Indicators: Discomfort Pain Intervention(s): Limited activity within patient's tolerance, Monitored during session, Repositioned     Extremity/Trunk Assessment Upper Extremity Assessment Upper Extremity Assessment: Generalized weakness   Lower  Extremity Assessment Lower Extremity Assessment: Defer to PT evaluation   Cervical / Trunk Assessment Cervical / Trunk Assessment: Normal   Communication Communication Communication: No apparent difficulties    Cognition Arousal: Alert Behavior During Therapy: Flat affect Overall Cognitive Status: No family/caregiver present to determine baseline cognitive functioning                                 General Comments: slowed processing, keeps eyes clsoed, aware of need to use the bathroom and follows commands appropriately     General Comments  VSS    Exercises     Shoulder Instructions      Home Living Family/patient expects to be discharged to:: Private residence Living Arrangements: Other relatives (grandson) Available Help at Discharge: Family Type of Home: Apartment Home Access: Stairs to enter Secretary/administrator of Steps: 7 Entrance Stairs-Rails: Can reach both Home Layout: One level     Bathroom Shower/Tub: Chief Strategy Officer: Standard Bathroom Accessibility: Yes   Home Equipment: Rollator (4 wheels);Shower seat;Wheelchair - manual;Hospital bed   Additional Comments: on 2L at home. Family reports making arrangements for 24/7 support      Prior Functioning/Environment Prior Level of Function : Needs assist             Mobility Comments: PRN use of w/c and rollator ADLs Comments: ind        OT Problem List: Impaired balance (sitting and/or standing);Decreased strength;Cardiopulmonary status limiting activity;Decreased range of motion;Decreased activity tolerance;Decreased safety awareness      OT Treatment/Interventions: Self-care/ADL training;Balance training;Therapeutic exercise;Therapeutic activities;Patient/family education    OT Goals(Current goals can be found in the care plan section) Acute Rehab OT Goals Patient Stated Goal: none stated OT Goal Formulation: With patient Time For Goal Achievement: 10/27/23 Potential to Achieve Goals: Good ADL Goals Pt Will Perform Upper Body Dressing: with modified independence;sitting Pt Will Perform Lower Body Dressing: with modified independence;sitting/lateral leans;sit to/from  stand Pt Will Transfer to Toilet: with modified independence;ambulating;regular height toilet Additional ADL Goal #1: pt will be able to stand x10 min for functional task in order to improve activity tolerance for ADLs  OT Frequency: Min 1X/week    Co-evaluation              AM-PAC OT "6 Clicks" Daily Activity     Outcome Measure Help from another person eating meals?: None Help from another person taking care of personal grooming?: A Little Help from another person toileting, which includes using toliet, bedpan, or urinal?: A Little Help from another person bathing (including washing, rinsing, drying)?: A Lot Help from another person to put on and taking off regular upper body clothing?: A Little Help from another person to put on and taking off regular lower body clothing?: A Lot 6 Click Score: 17   End of Session Equipment Utilized During Treatment: Gait belt;Rolling walker (2 wheels);Oxygen Nurse Communication: Mobility status  Activity Tolerance: Patient tolerated treatment well Patient left: in chair;with call bell/phone within reach;with chair alarm set  OT Visit Diagnosis: Unsteadiness on feet (R26.81);Cognitive communication deficit (R41.841);Muscle weakness (generalized) (M62.81)                Time: 2130-8657 OT Time Calculation (min): 23 min Charges:  OT General Charges $OT Visit: 1 Visit OT Evaluation $OT Eval Moderate Complexity: 1 Mod OT Treatments $Self Care/Home Management : 8-22 mins  [name], OTD, OTR/L SecureChat Preferred Acute Rehab ([phone])  [phone]   Dalphine Handing 10/13/2023, 2:19 PM

## 2023-10-13 NOTE — Consult Note (Signed)
[facility]  [phone]   HEMATOLOGY/ONCOLOGY IN-PATIENT CONSULTATION NOTE   PATIENT NAME: [name]   MR#: [id number] DOB: 12-10-1960 CSN#: [id number]   DATE OF SERVICE: 10/13/2023  Requesting Physician: [facility]   Patient Care Team: [name]., FNP as PCP - General (Family Medicine) Serena Croissant, MD as Consulting Physician (Hematology and Oncology) Almond Lint, MD as Consulting Physician (General Surgery) Margaretmary Dys, MD as Consulting Physician (Radiation Oncology) Si Gaul, MD as Consulting Physician (Oncology)  REASON FOR CONSULTATION:  Management decisions in a patient with stage IV squamous cell lung cancer, presenting with chemotherapy induced pancytopenia  HISTORY OF PRESENT ILLNESS  [name] is a 62 y.o. lady who is followed by Dr. Arbutus Ped in our clinic for Stage IV (T3, N0, M1C) non-small cell lung cancer, squamous cell carcinoma presented with right upper lobe lung mass in addition to solitary brain metastasis and T1 bone metastasis, diagnosed in August 2024 , currently receiving palliative systemic chemotherapy with carboplatin, paclitaxel and Keytruda, first dose on 10/04/2023.   She was found to have metastatic disease to the brain and underwent SBRT, the most recent being on 09/06/2023.  She was supposed to start systemic chemotherapy in early November but presented to the emergency room with altered mental status.  Her family found her slumped over the bed unresponsive and with associated left-sided weakness.  A CT of the head/MRI of the brain showed the known treated metastatic disease to the brain with surrounding edema and she was started on IV Decadron.  Radiation oncology evaluated the patient while in the hospital who recommended 4 mg of Decadron 4 times a day.  She also was put on Keppra for seizure prophylaxis and she was referred to Dr. [name] (Neuro-oncology).  She was discharged home with  home health PT and RN , a wheelchair, and a hospital bed.   The patient and her family went back and forth between pursuing hospice vs trying one cycle of treatment.  She eventually decided to pursue 1 cycle of palliative systemic chemotherapy with carboplatin for an AUC of 5, Taxol 175 mg/m2, and keytruda 200 mg IV every 3 weeks with neulasta support.  She received cycle 1 on 10/04/2023 and received Neulasta on 10/06/2023.  She presented to the ED on 10/12/2023 with complaints of fatigue and diarrhea.  She also reported decreased oral intake, generalized weakness, chills and abdominal discomfort.  In the ED she was found to be tachycardic in the 110's, febrile with temperature of 100.6 F.  White count was 900 with ANC of 400.  Platelet count was 21,000, hemoglobin 8.5.  Sepsis protocol activated and she was admitted to the hospital for further management.  We were consulted for any additional recommendations.  Patient seen and evaluated.  She reports persistent fatigue which has somewhat improved.  Also reports continued diarrhea.  Able to tolerate diet.  MEDICAL HISTORY Past Medical History:  Diagnosis Date   Acute on chronic respiratory failure (HCC) 10/09/2016   Allergic rhinitis    Breast cancer (HCC) 2016   right breast   COPD, mild (HCC) FOLLOWED BY DR OZDG   Depression    GERD (gastroesophageal reflux disease)    HTN (hypertension)    Hypertension    Iron deficiency anemia    Long-term current use of steroids SYMBICORT INHALER   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    Overweight (BMI 25.0-29.9) 05/11/2016   Palpitations    Personal history of radiation therapy 2016  Sarcoidosis STABLE PER CXR JUNE 2013   Shortness of breath    Sickle cell trait (HCC)      SURGICAL HISTORY Past Surgical History:  Procedure Laterality Date   ADJUSTABLE SUTURE MANIPULATION  05/22/2012   Procedure: ADJUSTABLE SUTURE MANIPULATION;  Surgeon: Corinda Gubler, MD;  Location: [facility];  Service:  Ophthalmology;  Laterality: Right;   BREAST LUMPECTOMY Right 2016   BRONCHIAL BIOPSY  07/09/2023   Procedure: BRONCHIAL BIOPSIES;  Surgeon: Leslye Peer, MD;  Location: [facility] ENDOSCOPY;  Service: Pulmonary;;   BRONCHIAL BRUSHINGS  07/09/2023   Procedure: BRONCHIAL BRUSHINGS;  Surgeon: Leslye Peer, MD;  Location: [facility] ENDOSCOPY;  Service: Pulmonary;;   BRONCHIAL NEEDLE ASPIRATION BIOPSY  07/09/2023   Procedure: BRONCHIAL NEEDLE ASPIRATION BIOPSIES;  Surgeon: Leslye Peer, MD;  Location: [facility] ENDOSCOPY;  Service: Pulmonary;;   CESAREAN SECTION  1986   W/ BILATERAL TUBAL LIGATION   COLONOSCOPY WITH PROPOFOL N/A 02/21/2019   Procedure: COLONOSCOPY WITH PROPOFOL;  Surgeon: Hilarie Fredrickson, MD;  Location: [facility] ENDOSCOPY;  Service: Endoscopy;  Laterality: N/A;   EYE SURGERY     IR IMAGING GUIDED PORT INSERTION  08/01/2023   MEDIAN RECTUS REPAIR  05/22/2012   Procedure: MEDIAN RECTUS REPAIR;  Surgeon: Corinda Gubler, MD;  Location: [facility];  Service: Ophthalmology;  Laterality: Bilateral;  INFERIOR RECTUS RESECTION WITH ADJUSTIBLE SUTURES RIGHT EYE    POLYPECTOMY  02/21/2019   Procedure: POLYPECTOMY;  Surgeon: Hilarie Fredrickson, MD;  Location: [facility] ENDOSCOPY;  Service: Endoscopy;;   RADIOACTIVE SEED GUIDED PARTIAL MASTECTOMY WITH AXILLARY SENTINEL LYMPH NODE BIOPSY Right 08/18/2015   Procedure: RADIOACTIVE SEED GUIDED PARTIAL MASTECTOMY WITH AXILLARY SENTINEL LYMPH NODE BIOPSY;  Surgeon: Almond Lint, MD;  Location: [facility];  Service: General;  Laterality: Right;   TOTAL ROBOTIC ASSISTED LAPAROSCOPIC HYSTERECTOMY  12-30-2010   SYMPTOMATIC UTERINE FIBROIDS   UPPER TEETH EXTRACTION'S  1992   VIDEO BRONCHOSCOPY WITH RADIAL ENDOBRONCHIAL ULTRASOUND  07/09/2023   Procedure: VIDEO BRONCHOSCOPY WITH RADIAL ENDOBRONCHIAL ULTRASOUND;  Surgeon: Leslye Peer, MD;  Location: [facility] ENDOSCOPY;  Service: Pulmonary;;     ALLERGIES  Allergies  Allergen Reactions   Codeine Other (See  Comments)    Avoids-felt bad when taken before Pt claims she can have this but does not like to take    FAMILY HISTORY  Family History  Problem Relation Age of Onset   Hyperlipidemia Mother    Asthma Mother    Lupus Mother    Breast cancer Mother 31   Hypertension Father    Hyperlipidemia Father    Hypertension Sister    Hypertension Brother    Cancer Neg Hx    Diabetes Neg Hx    Coronary artery disease Neg Hx      SOCIAL HISTORY   Social History   Socioeconomic History   Marital status: Single    Spouse name: Not on file   Number of children: Not on file   Years of education: Not on file   Highest education level: Not on file  Occupational History   Not on file  Tobacco Use   Smoking status: Former    Current packs/day: 0.00    Average packs/day: 1 pack/day for 20.0 years (20.0 ttl pk-yrs)    Types: Cigarettes    Start date: 11/14/1983    Quit date: 11/14/2003    Years since quitting: 19.9   Smokeless tobacco: Never  Vaping Use   Vaping status: Never Used  Substance and Sexual Activity   Alcohol use: No   Drug use: No   Sexual activity: Not on file  Other Topics Concern   Not on file  Social History Narrative   Lives with mother and his 3 children.  Works as a Psychologist, educational for sewing Administrator, arts)         Social Determinants of Corporate investment banker Strain: Not on file  Food Insecurity: No Food Insecurity (10/12/2023)   Hunger Vital Sign    Worried About Running Out of Food in the Last Year: Never true    Ran Out of Food in the Last Year: Never true  Transportation Needs: No Transportation Needs (10/12/2023)   PRAPARE - Administrator, Civil Service (Medical): No    Lack of Transportation (Non-Medical): No  Physical Activity: Not on file  Stress: Not on file  Social Connections: Not on file  Intimate Partner Violence: Not At Risk (10/12/2023)   Humiliation, Afraid, Rape, and Kick questionnaire    Fear of Current or Ex-Partner: No     Emotionally Abused: No    Physically Abused: No    Sexually Abused: No    CURRENT MEDICATIONS   Current Outpatient Medications  Medication Instructions   acetaminophen (TYLENOL) 650 mg, Oral, Every 6 hours PRN   albuterol (PROVENTIL) 2.5 mg, Nebulization, Every 6 hours PRN   albuterol (VENTOLIN HFA) 108 (90 Base) MCG/ACT inhaler INHALE 2 PUFFS EVERY 6 HOURS AS NEEDED FOR WHEEZING OR SHORTNESS OF BREATH   aspirin 81 mg, Oral, Daily   atorvastatin (LIPITOR) 20 mg, Oral, Daily   dexamethasone (DECADRON) 4 mg, Oral, Every 6 hours   fluticasone (FLONASE) 50 MCG/ACT nasal spray USE 2 SPRAYS IN BOTH  NOSTRILS DAILY AS NEEDED  FOR ALLERGIES   hydroxychloroquine (PLAQUENIL) 200 mg, Oral, Daily   levETIRAcetam (KEPPRA) 750 mg, Oral, 2 times daily   lidocaine-prilocaine (EMLA) cream Apply to affected area once   metoprolol tartrate (LOPRESSOR) 25 mg, Oral, Daily   montelukast (SINGULAIR) 10 mg, Oral, Daily at bedtime   Multiple Vitamin (MULTIVITAMIN) capsule 1 capsule, Oral, Daily with breakfast   omeprazole (PRILOSEC) 20 MG capsule TAKE 1 CAPSULE EVERY DAY   ondansetron (ZOFRAN) 4 mg, Oral, Every 6 hours PRN   OXYGEN 2-3 L/min, Inhalation, See admin instructions, Inhale 2-3 L/min into the lungs CONTINUOUSLY; 2 L/min at rest and 3 L/min if exerted   pantoprazole (PROTONIX) 40 mg, Oral, Daily   prochlorperazine (COMPAZINE) 10 mg, Oral, Every 6 hours PRN   sertraline (ZOLOFT) 50 MG tablet TAKE 1 TABLET EVERY DAY   STIOLTO RESPIMAT 2.5-2.5 MCG/ACT AERS USE 2 INHALATIONS BY MOUTH DAILY     REVIEW OF SYSTEMS   Review of Systems - Oncology  All other pertinent review of systems is negative except as mentioned above in HPI  PHYSICAL EXAMINATION  ECOG PERFORMANCE STATUS: 2 - Symptomatic, <50% confined to bed  Vitals:   10/13/23 0748 10/13/23 0903  BP: (!) 152/79   Pulse: 79 84  Resp: 18 18  Temp: 97.8 F (36.6 C)   SpO2: 100%    Filed Weights   10/12/23 1500 10/13/23 0415  Weight: 159  lb 11.2 oz (72.4 kg) 159 lb (72.1 kg)    Physical Exam Constitutional:      General: She is not in acute distress.    Appearance: Normal appearance.  HENT:     Head: Normocephalic and atraumatic.  Eyes:     General: No scleral icterus.  Conjunctiva/sclera: Conjunctivae normal.  Cardiovascular:     Rate and Rhythm: Normal rate and regular rhythm.     Heart sounds: Normal heart sounds.  Pulmonary:     Effort: Pulmonary effort is normal.     Breath sounds: Normal breath sounds.  Abdominal:     General: There is no distension.  Musculoskeletal:     Right lower leg: No edema.     Left lower leg: No edema.  Neurological:     General: No focal deficit present.     Mental Status: She is alert and oriented to person, place, and time.  Psychiatric:        Mood and Affect: Mood normal.        Behavior: Behavior normal.        Thought Content: Thought content normal.     LABORATORY DATA:   I have reviewed the data as listed  Results for orders placed or performed during the hospital encounter of 10/12/23 (from the past 24 hour(s))  Magnesium   Collection Time: 10/12/23  4:22 PM  Result Value Ref Range   Magnesium 1.0 (L) 1.7 - 2.4 mg/dL  C Difficile Quick Screen w PCR reflex   Collection Time: 10/12/23  5:57 PM   Specimen: STOOL  Result Value Ref Range   C Diff antigen NEGATIVE NEGATIVE   C Diff toxin NEGATIVE NEGATIVE   C Diff interpretation No C. difficile detected.   CBC with Differential/Platelet   Collection Time: 10/13/23  4:43 AM  Result Value Ref Range   WBC 1.9 (L) 4.0 - 10.5 K/uL   RBC 2.77 (L) 3.87 - 5.11 MIL/uL   Hemoglobin 7.2 (L) 12.0 - 15.0 g/dL   HCT 62.1 (L) 30.8 - 65.7 %   MCV 84.1 80.0 - 100.0 fL   MCH 26.0 26.0 - 34.0 pg   MCHC 30.9 30.0 - 36.0 g/dL   RDW 84.6 (H) 96.2 - 95.2 %   Platelets 14 (LL) 150 - 400 K/uL   nRBC 0.0 0.0 - 0.2 %   Neutrophils Relative % 83 %   Neutro Abs 1.6 (L) 1.7 - 7.7 K/uL   Lymphocytes Relative 13 %   Lymphs Abs  0.2 (L) 0.7 - 4.0 K/uL   Monocytes Relative 3 %   Monocytes Absolute 0.1 0.1 - 1.0 K/uL   Eosinophils Relative 1 %   Eosinophils Absolute 0.0 0.0 - 0.5 K/uL   Basophils Relative 0 %   Basophils Absolute 0.0 0.0 - 0.1 K/uL   WBC Morphology Moderate Left Shift (>5% metas and myelos)    nRBC 0 0 /100 WBC   Immature Granulocytes 6 %   Abs Immature Granulocytes 0.12 (H) 0.00 - 0.07 K/uL  Comprehensive metabolic panel   Collection Time: 10/13/23  4:43 AM  Result Value Ref Range   Sodium 142 135 - 145 mmol/L   Potassium 2.7 (LL) 3.5 - 5.1 mmol/L   Chloride 103 98 - 111 mmol/L   CO2 30 22 - 32 mmol/L   Glucose, Bld 77 70 - 99 mg/dL   BUN 11 8 - 23 mg/dL   Creatinine, Ser 8.41 0.44 - 1.00 mg/dL   Calcium 7.1 (L) 8.9 - 10.3 mg/dL   Total Protein 4.8 (L) 6.5 - 8.1 g/dL   Albumin 2.2 (L) 3.5 - 5.0 g/dL   AST 15 15 - 41 U/L   ALT 14 0 - 44 U/L   Alkaline Phosphatase 43 38 - 126 U/L   Total Bilirubin 0.8 <1.2 mg/dL  GFR, Estimated >60 >60 mL/min   Anion gap 9 5 - 15  Magnesium   Collection Time: 10/13/23  4:43 AM  Result Value Ref Range   Magnesium 1.6 (L) 1.7 - 2.4 mg/dL  Phosphorus   Collection Time: 10/13/23  4:43 AM  Result Value Ref Range   Phosphorus 2.5 2.5 - 4.6 mg/dL   *Note: Due to a large number of results and/or encounters for the requested time period, some results have not been displayed. A complete set of results can be found in Results Review.      RADIOGRAPHIC STUDIES:  I have personally reviewed the radiological images as listed and agree with the findings in the report.  DG Chest Port 1 View  Result Date: 10/12/2023 CLINICAL DATA:  Possible sepsis. EXAM: PORTABLE CHEST 1 VIEW COMPARISON:  09/17/2023 and older exams.  CT, 09/19/2023. FINDINGS: Cardiac silhouette is normal in size. No convincing mediastinal or hilar masses. Right anterior chest wall Port-A-Cath is stable. Right-sided volume loss with mediastinal shift, stable. There is an ill-defined masslike  opacity in the right upper lobe, unchanged. Stable interstitial type opacities right mid lung and both lung bases. No new lung abnormalities. No convincing pleural effusion.  No pneumothorax. Skeletal structures are grossly intact. Surgical vascular clips on the right reflect prior breast surgery, also stable IMPRESSION: 1. No significant change from the recent prior chest radiograph. 2. 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 3. Right-sided volume loss with mediastinal shift. 4. No new abnormalities. Electronically Signed   By: Amie Portland M.D.   On: 10/12/2023 08:45   CT CHEST W CONTRAST  Result Date: 09/19/2023 CLINICAL DATA:  Non-small cell lung cancer staging; * Tracking Code: BO * EXAM: CT CHEST WITH CONTRAST TECHNIQUE: Multidetector CT imaging of the chest was performed during intravenous contrast administration. RADIATION DOSE REDUCTION: This exam was performed according to the departmental dose-optimization program which includes automated exposure control, adjustment of the mA and/or kV according to patient size and/or use of iterative reconstruction technique. CONTRAST:  50mL OMNIPAQUE IOHEXOL 350 MG/ML SOLN COMPARISON:  PET-CT dated August 11, 2023; chest CT dated June 26, 2023 FINDINGS: Cardiovascular: Normal heart size. No pericardial effusion. Normal caliber thoracic aorta with moderate atherosclerotic disease. Mediastinum/Nodes: Esophagus and thyroid are unremarkable. No enlarged lymph nodes seen in the chest. Lungs/Pleura: Right upper lobe mass is decreased in size with new areas of cavitation measuring 4.8 x 3.6 cm, previously 6.3 x 5.1 cm. Stable irregular linear opacities of the bilateral lower lobes, likely due to scarring. New small right pleural effusion. Severe emphysema. Upper Abdomen: Stable cystic lesion of the anterior right kidney. No acute abnormality. Musculoskeletal: No chest wall abnormality. Unchanged diffuse sclerosis of the T3 vertebral body.  IMPRESSION: 1. Right upper lobe mass is decreased in size with new areas of cavitation. 2. New small right pleural effusion. 3. Unchanged diffuse sclerosis of the T3 vertebral body. No evidence of progressive metastatic disease. 4. Aortic Atherosclerosis (ICD10-I70.0) and Emphysema (ICD10-J43.9). Electronically Signed   By: Allegra Lai M.D.   On: 09/19/2023 11:11   Overnight EEG with video  Result Date: 09/18/2023 Charlsie Quest, MD     09/18/2023  8:37 AM Patient Name: NALAH HAAN MRN: [id number] Epilepsy Attending: Charlsie Quest Referring Physician/Provider: Marjorie Smolder, NP Duration: 09/18/2023 0020 to 0830  Patient history:  62 y.o. female with history of stage IV non-small cell lung cancer with mets to the brain status post stereotactic radiosurgery on  10/28, breast cancer, GERD, depression, COPD, hypertension, anemia, sarcoidosis and sickle cell trait who presents with altered mental status and decreased responsiveness as of this morning. EEG to evaluate for seizure  Level of alertness:  comatose/ lethargic-->awake  AEDs during EEG study: LEV  Technical aspects: This EEG study was done with scalp electrodes positioned according to the 10-20 International system of electrode placement. Electrical activity was reviewed with band pass filter of 1-70Hz , sensitivity of 7 uV/mm, display speed of 35mm/sec with a 60Hz  notched filter applied as appropriate. EEG data were recorded continuously and digitally stored.  Video monitoring was available and reviewed as appropriate.  Description: EEG initially showed lateralized periodic discharges ( LPDs) with overriding fast activity were noted in left hemisphere with fluctuating frequency of 0.5 to 2 Hz, at times with overlying rhythmicity, without definite evolution. EEG also showed continuous generalized 3 to 6 Hz theta- delta slowing admixed with 12 to 15 Hz with activity. Gradually, the morphology of LPDs improved and there was no overriding fast  activity.  Additionally the frequency of LPDs improved to 0.25-0.5Hz .  EEG also showed posterior dominant rhythm of 8 Hz activity of moderate voltage (25-35 uV) seen predominantly in posterior head regions, symmetric and reactive to eye opening and eye closing.  Intermittent generalized and lateralized left hemisphere 3-6 Esti Deppen delta slowing was also noted.  Hyperventilation and photic stimulation were not performed.    ABNORMALITY - Lateralized periodic discharges ( LPD), left hemisphere - Continuous slow, generalized and lateralized left hemisphere  IMPRESSION: This study initially showed evidence of epileptogenicity arising from left hemisphere with increased risk of seizure recurrence.  Additionally there was moderate diffuse encephalopathy.  Gradually, EEG improved and was suggestive of cortical dysfunction in left hemisphere likely secondary to underlying structural abnormality as well as mild diffuse encephalopathy.  No seizures were seen during the study.  Charlsie Quest   EEG adult  Result Date: 09/18/2023 Charlsie Quest, MD     09/18/2023  8:33 AM Patient Name: ETHELREDA KLITZKE MRN: [id number] Epilepsy Attending: Charlsie Quest Referring Physician/Provider: Alessandra Bevels, MD Date: 09/17/2023 Duration: 26.50 mins Patient history:  62 y.o. 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 EEG to evaluate for seizure Level of alertness:  comatose/ lethargic AEDs during EEG study: LEV Technical aspects: This EEG study was done with scalp electrodes positioned according to the 10-20 International system of electrode placement. Electrical activity was reviewed with band pass filter of 1-70Hz , sensitivity of 7 uV/mm, display speed of  64mm/sec with a 60Hz  notched filter applied as appropriate. EEG data were recorded continuously and digitally stored.  Video monitoring was available and reviewed as appropriate. Description: EEG showed continuous generalized 3 to 6 Hz theta- delta slowing admixed with 12 to 15 Hz with activity.  Lateralized periodic discharges with overriding fast activity were noted in left hemisphere with fluctuating frequency of 0.5 to 2 Hz, at times with overlying rhythmicity, without definite evolution. Hyperventilation and photic stimulation were not performed.   ABNORMALITY -Lateralized periodic discharges with overriding fast activity ( LPD+F), left hemisphere - Continuous slow, generalized IMPRESSION: This study showed evidence of epileptogenicity arising from left hemisphere.  This EEG pattern is on the ictal-interictal continuum with increased risk for seizures.  Additionally there is moderate  to severe diffuse encephalopathy.  No definite seizures were seen during the study. Charlsie Quest   MR BRAIN W WO CONTRAST  Result Date: 09/17/2023 CLINICAL DATA:  Altered mental status and seizure. Brain metastases. EXAM: MRI HEAD WITHOUT AND WITH CONTRAST TECHNIQUE: Multiplanar, multiecho pulse sequences of the brain and surrounding structures were obtained without and with intravenous contrast. CONTRAST:  7mL GADAVIST GADOBUTROL 1 MMOL/ML IV SOLN COMPARISON:  08/29/2023 FINDINGS: Brain: There is abnormal diffusion restriction within the medial left PCA territory, including the dorsomedial left thalamus. There is also diffusion restriction of the left insula. There is vasogenic edema within the anterior left temporal lobe and left occipital lobe, near the regions of diffusion restriction. There contrast-enhancing masses at both locations. The left temporal mass measures 10 mm, unchanged (series 19, image 27). Left occipital mass measures 10 mm, unchanged (image 23). Lesion of the right postcentral gyrus measures 7 mm,  previously 9 mm (image 48). There is a focus of likely extra-axial contrast enhancement at the left cerebellopontine angle that measures 7 mm, previously 3 mm (series 19, image 18). The midline structures are normal. Vascular: Normal flow voids. Skull and upper cervical spine: Normal calvarium and skull base. Visualized upper cervical spine and soft tissues are normal. Sinuses/Orbits:No paranasal sinus fluid levels or advanced mucosal thickening. No mastoid or middle ear effusion. Normal orbits. IMPRESSION: 1. Unchanged size of contrast-enhancing masses of the left temporal lobe and left occipital lobe with progression of surrounding vasogenic edema. 2. Areas of diffusion restriction of the left insula and left PCA territory, near the above described lesions, may be a postictal phenomenon. 3. Slight decrease in size of the right postcentral gyrus metastasis. 4. Increased size of a 7 mm extra-axial contrast-enhancing mass at the left cerebellopontine angle, which could be a meningioma or extra-axial metastasis. Electronically Signed   By: Deatra Robinson M.D.   On: 09/17/2023 19:51   DG Chest Port 1 View  Result Date: 09/17/2023 CLINICAL DATA:  Altered mental status.  Lung cancer. EXAM: PORTABLE CHEST 1 VIEW COMPARISON:  08/27/2023. FINDINGS: There is a right chest wall port a catheter with tip in the distal SVC. Stable cardiomediastinal contours. The right upper lobe treated lung mass is again noted with signs of central cavitary change. There is asymmetric right lung volume loss with increase opacification in the right lower lung compared with the previous exam. Left lung appears clear. IMPRESSION: 1. Increased opacification in the right lower lung compared with the previous exam. Atelectasis and or pleural effusion. Underlying pneumonia would be difficult to exclude. 2. Stable appearance of right upper lobe lung mass with central cavitation. Electronically Signed   By: Signa Kell M.D.   On: 09/17/2023 15:41    CT HEAD WO CONTRAST  Result Date: 09/17/2023 CLINICAL DATA:  Mental status change, unknown cause EXAM: CT HEAD WITHOUT CONTRAST TECHNIQUE: Contiguous axial images were obtained from the base of the skull through the vertex without intravenous contrast. RADIATION DOSE REDUCTION: This exam was performed according to the departmental dose-optimization program which includes automated exposure control, adjustment of the mA and/or kV according to patient size and/or use of iterative reconstruction technique. COMPARISON:  Brain MR 08/29/23 FINDINGS: Brain: No hemorrhage. No hydrocephalus. No extra-axial fluid collection. No CT evidence of an acute cortical infarct. 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 There is no definite evidence of new metastatic disease on this noncontrast enhanced exam.  Vascular: No hyperdense vessel or unexpected calcification. Skull: Normal. Negative for fracture or focal lesion. Sinuses/Orbits: No acute finding. Other: None. IMPRESSION: 1. No CT evidence of an acute cortical infarct or intracranial hemorrhage. 2. Redemonstrated vasogenic edema at sites of known metastatic disease in the anterior left temporal lobe and in the left occipital lobe. There is no definite evidence of new metastatic disease on this noncontrast enhanced exam. If more detailed characterization for new metastatic disease is clinically warranted, further evaluation with repeat brain MRI recommended. Electronically Signed   By: Lorenza Cambridge M.D.   On: 09/17/2023 14:59    ASSESSMENT & PLAN:   62 y.o. lady who is followed by Dr. Arbutus Ped in our clinic for Stage IV (T3, N0, M1C) non-small cell lung cancer, squamous cell carcinoma presented with right upper lobe lung mass in addition to solitary brain metastasis and T1 bone metastasis, diagnosed in August 2024 , currently receiving palliative systemic chemotherapy with  carboplatin, paclitaxel and Keytruda, first dose on 10/04/2023.  She did receive Neulasta on 10/06/2023.  She presented to the ED on 10/12/2023 with complaints of fatigue and diarrhea.  She also reported decreased oral intake, generalized weakness, chills and abdominal discomfort.  In the ED she was found to be tachycardic in the 110's, febrile with temperature of 100.6 F.  White count was 900 with ANC of 400.  Platelet count was 21,000, hemoglobin 8.5.  Sepsis protocol activated and she was admitted to the hospital for further management.  Agree with broad-spectrum antibiotic coverage with vancomycin and Zosyn for neutropenic fever.  She did receive Neulasta after last chemotherapy.  Expect neutrophil count and rest of the blood counts to slowly improve over the next 2 to 3 days.  She was previously on Decadron for her history of solitary brain mets.  We can hold this for now given neutropenic fever.  Continue Keppra for seizure prophylaxis.  Severe thrombocytopenia is related to recent myelosuppressive chemotherapy.  Expect platelet count to slowly improve over the next few days.  No need for extensive workup.  Transfuse as needed to maintain platelet count above 20,000.    Transfuse as needed to maintain hemoglobin above 8.  Continue to monitor electrolytes daily and replace as needed.  Rest of care as per primary team and other specialties.  Thanks for the opportunity to participate in the care of this patient. Please contact me if there are any questions.  Patient will eventually follow-up with Dr. Arbutus Ped after discharge from hospital for further treatment decisions in regards to continue treatment.  At this time, patient is planning to proceed with cycle 2 of chemoimmunotherapy, which will be due on 10/25/2023.  Meryl Crutch, MD Medical Oncology and Hematology 10/13/2023 12:05 PM    This document was completed utilizing speech recognition software. Grammatical errors, random word  insertions, pronoun errors, and incomplete sentences are an occasional consequence of this system due to software limitations, ambient noise, and hardware issues. Any formal questions or concerns about the content, text or information contained within the body of this dictation should be directly addressed to the provider for clarification.

## 2023-10-14 ENCOUNTER — Inpatient Hospital Stay (HOSPITAL_COMMUNITY): Payer: 59

## 2023-10-14 DIAGNOSIS — C3491 Malignant neoplasm of unspecified part of right bronchus or lung: Secondary | ICD-10-CM | POA: Diagnosis not present

## 2023-10-14 DIAGNOSIS — D61818 Other pancytopenia: Secondary | ICD-10-CM | POA: Diagnosis not present

## 2023-10-14 DIAGNOSIS — D696 Thrombocytopenia, unspecified: Secondary | ICD-10-CM | POA: Diagnosis not present

## 2023-10-14 DIAGNOSIS — D709 Neutropenia, unspecified: Secondary | ICD-10-CM | POA: Diagnosis not present

## 2023-10-14 LAB — COMPREHENSIVE METABOLIC PANEL
ALT: 12 U/L (ref 0–44)
AST: 12 U/L — ABNORMAL LOW (ref 15–41)
Albumin: 2.3 g/dL — ABNORMAL LOW (ref 3.5–5.0)
Alkaline Phosphatase: 50 U/L (ref 38–126)
Anion gap: 11 (ref 5–15)
BUN: 13 mg/dL (ref 8–23)
CO2: 25 mmol/L (ref 22–32)
Calcium: 7.8 mg/dL — ABNORMAL LOW (ref 8.9–10.3)
Chloride: 104 mmol/L (ref 98–111)
Creatinine, Ser: 1.12 mg/dL — ABNORMAL HIGH (ref 0.44–1.00)
GFR, Estimated: 55 mL/min — ABNORMAL LOW (ref >60)
Glucose, Bld: 144 mg/dL — ABNORMAL HIGH (ref 70–99)
Potassium: 4.5 mmol/L (ref 3.5–5.1)
Sodium: 140 mmol/L (ref 135–145)
Total Bilirubin: 0.8 mg/dL (ref <1.2)
Total Protein: 5.2 g/dL — ABNORMAL LOW (ref 6.5–8.1)

## 2023-10-14 LAB — CBC
HCT: 25.2 % — ABNORMAL LOW (ref 36.0–46.0)
Hemoglobin: 7.9 g/dL — ABNORMAL LOW (ref 12.0–15.0)
MCH: 26.5 pg (ref 26.0–34.0)
MCHC: 31.3 g/dL (ref 30.0–36.0)
MCV: 84.6 fL (ref 80.0–100.0)
Platelets: 23 10*3/uL — CL (ref 150–400)
RBC: 2.98 MIL/uL — ABNORMAL LOW (ref 3.87–5.11)
RDW: 16.7 % — ABNORMAL HIGH (ref 11.5–15.5)
WBC: 4 10*3/uL (ref 4.0–10.5)
nRBC: 0.5 % — ABNORMAL HIGH (ref 0.0–0.2)

## 2023-10-14 LAB — MAGNESIUM: Magnesium: 1.7 mg/dL (ref 1.7–2.4)

## 2023-10-14 MED ORDER — VANCOMYCIN HCL 1250 MG/250ML IV SOLN
1250.0000 mg | INTRAVENOUS | Status: DC
  Administered 2023-10-15 – 2023-10-16 (×2): 1250 mg via INTRAVENOUS
  Filled 2023-10-14 (×2): qty 250

## 2023-10-14 MED ORDER — GADOBUTROL 1 MMOL/ML IV SOLN
7.0000 mL | Freq: Once | INTRAVENOUS | Status: AC | PRN
  Administered 2023-10-14: 7 mL via INTRAVENOUS

## 2023-10-14 NOTE — Plan of Care (Signed)

## 2023-10-14 NOTE — Progress Notes (Signed)
Pharmacy Antibiotic Note  [name] is a 62 y.o. 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  Pharmacy has been consulted for vancomycin dosing; already on cefepime and metronidazole. Liver abscess is suspected.  She is afebrile, WBC up to 4, and SCr increased from 0.72 > 1.12.   Plan: Change vancomycin to 1250 mg IV q24h Goal AUC 400-550, eAUC 454.6, SCr used 1.12, Vd 0.72 Monitor renal function, clinical progress, cultures/sensitivities F/U LOT and de-escalate as able Vancomycin levels as clinically indicated   Height: 5\' 5"  (165.1 cm) Weight: 72.1 kg (159 lb) IBW/kg (Calculated) : 57  Temp (24hrs), Avg:98 F (36.7 C), Min:97.4 F (36.3 C), Max:98.5 F (36.9 C)  Recent Labs  Lab 10/12/23 0845 10/12/23 0906 10/13/23 0443 10/14/23 0817 10/14/23 0940  WBC 0.9*  --  1.9*  --  4.0  CREATININE 0.88  --  0.72 1.12*  --   LATICACIDVEN  --  1.2  --   --   --     Estimated Creatinine Clearance: 51.1 mL/min (A) (by C-G formula based on SCr of 1.12 mg/dL (H)).    Allergies  Allergen Reactions   Codeine Other (See Comments)    Avoids-felt bad when taken before Pt claims she can have this but does not like to take    Antimicrobials this admission: Zosyn x1 11/29 vancomycin 11/29 >>  cefepime 11/29 >>  Metronidazole 11/29 >>  Dose adjustments this admission: 12/1: adjust vancomycin to 1250 mg IV q24h  Microbiology results: Cdiff (-) 11/29 Bcx: ngtd 11/29 Resp panel (-)  Thank you for involving pharmacy in this patient's care.  Loura Back, PharmD, BCPS Clinical Pharmacist Clinical phone for 10/14/2023 is [phone] 10/14/2023 6:13 PM

## 2023-10-14 NOTE — Progress Notes (Signed)
PROGRESS NOTE  [name] QQV:[id number] DOB: [birth year]   PCP: [name]., FNP  Patient is from: Home.  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  Last chemotherapy on 10/06/2023.  In ED, she was febrile 200.6 and tachycardic to 136.  WBC 0.9 with ANC of 0.6 and ALC of 0.1.  K3.0.  Total bili 1.4.  Hgb 8.5.  Platelet 21 (normal baseline).  COVID-19, influenza and RSV PCR nonreactive.  INR 1.6.  UA and CXR without acute finding.  Lactic acid 1.2.  Mg 1.0.  C. difficile, blood cultures and CT abdomen and pelvis ordered.  Patient was started on vancomycin and cefepime, and admitted with working diagnosis of neutropenic fever and diarrhea.  CT abdomen and pelvis with new complex cystic areas of right hepatic lobe worrisome for potential abscess formation, indeterminate mildly complex small cystic area in the spleen and wall thickening with stranding along the descending colon.  Blood cultures NGTD.  Leukopenia/neutropenia resolved.  Remains on broad-spectrum antibiotics pending MRI abdomen due to concern for possible hepatic abscess.  Clinically improving.    Subjective: Seen and examined earlier this morning.  No major events overnight of this morning.  No complaints.  Reports feeling better.  Has no specific complaints other than feeling sleepy.  She is oriented x 4.  Objective: Vitals:   10/14/23 0323 10/14/23 0750 10/14/23 1200 10/14/23 1241  BP: 126/70  119/64 128/65  Pulse: 61  67 81  Resp: 18  16 18   Temp: (!) 97.4 F (36.3 C)  98.3 F (36.8 C) 98.2 F (36.8 C)  TempSrc: Oral   Oral Oral  SpO2: 100% 98% 100% 99%  Weight:      Height:        Examination:  GENERAL: No apparent distress.  Nontoxic. HEENT: MMM.  Vision and hearing grossly intact.  NECK: Supple.  No apparent JVD.  RESP:  No IWOB.  Fair aeration bilaterally. CVS:  RRR. Heart sounds normal.  ABD/GI/GU: BS+. Abd soft, NTND.  MSK/EXT:  Moves extremities. No apparent deformity. No edema.  SKIN: no apparent skin lesion or wound NEURO: Awake, alert and oriented appropriately.  No apparent focal neuro deficit. PSYCH: Calm. Normal affect.   Procedures:  None  Microbiology summarized: COVID-19, influenza and RSV PCR nonreactive Blood cultures NGTD C. difficile negative  Assessment and plan: Neutropenic fever: Likely due to recent chemotherapy.  Febrile to 100.6 and tachycardic to 136 on arrival.  Infectious workup unrevealing except for CT abdomen and pelvis concerning for possible hepatic and splenic abscess and colitis..  Leukopenia and neutropenia resolved.  Improving clinically. -Continue IV cefepime, vancomycin and Flagyl -Follow-up MRI abdomen -Appreciate help by oncology   Pancytopenia: On 11/19, WBC 12, Hgb 9.5 and platelet 189.  Likely due to recent chemo.  Improved Recent Labs  Lab 10/12/23 0845 10/13/23 0443 10/14/23 0940  WBC 0.9* 1.9* 4.0  NEUTROABS 0.6* 1.6*  --   HGB 8.5* 7.2* 7.9*  HCT 27.2* 23.3* 25.2*  MCV 85.3 84.1 84.6  PLT 21* 14* 23*  -Continue monitoring -Verbally consented for blood products if indicated. -Appreciate  help by hematology/oncology  Stage IV NSCLC with brain and osseous mets-on radiation and chemotherapy.  Last chemo on 11/23. -Continue home Keppra -Resume Decadron at 4 mg 3 times daily.  Takes 4 times daily at home. -Appreciate input by hematology/oncology -Pain control  Diarrhea/colitis: Likely from chemo.  Abdominal exam benign.  C. difficile negative.  CT abdomen and pelvis raises concern for colitis. -Antibiotics as above -Continue Imodium  as needed  Hypokalemia/hypomagnesemia: Likely due to diarrhea -Monitor replenish as appropriate  Fatigue: Multifactorial including chemotherapy and acute illness. -PT/OT   Chronic COPD/sarcoidosis/hypoxic RF: On 2 L at baseline.  Stable. -Continue home Singulair and breathing treatments -Agree with holding Plaquenil   Essential hypertension: Normotensive for most part. -Metoprolol 25 mg twice daily.  Not sure why she is taking once daily at home.   GERD -Protonix 40 mg daily   HLD -Atorvastatin 20 mg daily   Anxiety and depression: Stable -Zoloft 50 mg daily  History of breast cancer -Outpatient follow-up  Body mass index is 26.46 kg/m.           DVT prophylaxis:  SCDs Start: 10/12/23 1436  Code Status: DNR/DNI Family Communication: None at bedside Level of care: Telemetry Medical Status is: Inpatient Remains inpatient appropriate because: Neutropenic fever, possible hepatic abscess and colitis   Final disposition: Likely home Consultants:  Oncology/hematology  55 minutes with more than 50% spent in reviewing records, counseling patient/family and coordinating care.   Sch Meds:  Scheduled Meds:  arformoterol  15 mcg Nebulization BID   And   umeclidinium bromide  1 puff Inhalation Daily   atorvastatin  20 mg Oral Daily   dexamethasone  4 mg Oral Q8H   levETIRAcetam  750 mg Oral BID   metoprolol tartrate  25 mg Oral BID   montelukast  10 mg Oral QHS   multivitamin with minerals  1 tablet Oral Q breakfast   pantoprazole  40 mg Oral Daily   sertraline  50 mg Oral Daily   sodium chloride flush  10 mL Intravenous Q12H   Continuous Infusions:  ceFEPime (MAXIPIME) IV 2 g (10/14/23 1319)   metronidazole 500 mg (10/14/23 0945)   vancomycin 1,000 mg (10/14/23 0758)   PRN Meds:.acetaminophen **OR** acetaminophen, albuterol, hydrALAZINE, loperamide, ondansetron **OR** ondansetron (ZOFRAN) IV, oxyCODONE-acetaminophen,  senna-docusate  Antimicrobials: Anti-infectives (From admission, onward)    Start     Dose/Rate Route Frequency Ordered Stop   10/13/23 2000  metroNIDAZOLE (FLAGYL) IVPB 500 mg        500 mg 100 mL/hr over 60 Minutes Intravenous Every 12 hours 10/13/23 1904     10/13/23 2000  vancomycin (VANCOCIN) IVPB 1000 mg/200 mL premix        1,000 mg 200 mL/hr over 60 Minutes Intravenous Every 12 hours 10/13/23 1918     10/13/23 1000  hydroxychloroquine (PLAQUENIL) tablet 200 mg  Status:  Discontinued        200 mg Oral Daily 10/12/23 1830 10/12/23 1922   10/12/23 2130  ceFEPIme (MAXIPIME) 2 g in sodium chloride 0.9 % 100 mL IVPB        2 g 200 mL/hr over 30 Minutes Intravenous Every 8 hours 10/12/23 2015     10/12/23 1215  vancomycin (VANCOREADY) IVPB 1750 mg/350 mL        1,750 mg 175 mL/hr over 120 Minutes Intravenous  Once 10/12/23 1203 10/12/23 1624   10/12/23 1115  piperacillin-tazobactam (ZOSYN) IVPB 3.375 g        3.375 g 100 mL/hr  over 30 Minutes Intravenous  Once 10/12/23 1112 10/12/23 1312   10/12/23 0830  cefTRIAXone (ROCEPHIN) 2 g in sodium chloride 0.9 % 100 mL IVPB  Status:  Discontinued        2 g 200 mL/hr over 30 Minutes Intravenous  Once 10/12/23 0829 10/12/23 1150   10/12/23 0830  metroNIDAZOLE (FLAGYL) IVPB 500 mg        500 mg 100 mL/hr over 60 Minutes Intravenous  Once 10/12/23 0829 10/12/23 1149        I have personally reviewed the following labs and images: CBC: Recent Labs  Lab 10/12/23 0845 10/13/23 0443 10/14/23 0940  WBC 0.9* 1.9* 4.0  NEUTROABS 0.6* 1.6*  --   HGB 8.5* 7.2* 7.9*  HCT 27.2* 23.3* 25.2*  MCV 85.3 84.1 84.6  PLT 21* 14* 23*   BMP &GFR Recent Labs  Lab 10/12/23 0845 10/12/23 1622 10/13/23 0443 10/14/23 0817  NA 141  --  142 140  K 3.0*  --  2.7* 4.5  CL 93*  --  103 104  CO2 35*  --  30 25  GLUCOSE 76  --  77 144*  BUN 12  --  11 13  CREATININE 0.88  --  0.72 1.12*  CALCIUM 7.5*  --  7.1* 7.8*  MG  --  1.0* 1.6* 1.7   PHOS  --   --  2.5  --    Estimated Creatinine Clearance: 51.1 mL/min (A) (by C-G formula based on SCr of 1.12 mg/dL (H)). Liver & Pancreas: Recent Labs  Lab 10/12/23 0845 10/13/23 0443 10/14/23 0817  AST 21 15 12*  ALT 13 14 12   ALKPHOS 47 43 50  BILITOT 1.4* 0.8 0.8  PROT 5.1* 4.8* 5.2*  ALBUMIN 2.5* 2.2* 2.3*   No results for input(s): "LIPASE", "AMYLASE" in the last 168 hours. No results for input(s): "AMMONIA" in the last 168 hours. Diabetic: No results for input(s): "HGBA1C" in the last 72 hours. No results for input(s): "GLUCAP" in the last 168 hours. Cardiac Enzymes: No results for input(s): "CKTOTAL", "CKMB", "CKMBINDEX", "TROPONINI" in the last 168 hours. No results for input(s): "PROBNP" in the last 8760 hours. Coagulation Profile: Recent Labs  Lab 10/12/23 0845  INR 1.6*   Thyroid Function Tests: No results for input(s): "TSH", "T4TOTAL", "FREET4", "T3FREE", "THYROIDAB" in the last 72 hours. Lipid Profile: No results for input(s): "CHOL", "HDL", "LDLCALC", "TRIG", "CHOLHDL", "LDLDIRECT" in the last 72 hours. Anemia Panel: No results for input(s): "VITAMINB12", "FOLATE", "FERRITIN", "TIBC", "IRON", "RETICCTPCT" in the last 72 hours. Urine analysis:    Component Value Date/Time   COLORURINE YELLOW 10/12/2023 0930   APPEARANCEUR HAZY (A) 10/12/2023 0930   LABSPEC 1.010 10/12/2023 0930   PHURINE 7.0 10/12/2023 0930   GLUCOSEU NEGATIVE 10/12/2023 0930   HGBUR MODERATE (A) 10/12/2023 0930   HGBUR small 05/13/2010 1505   BILIRUBINUR NEGATIVE 10/12/2023 0930   BILIRUBINUR negative 12/08/2019 0000   BILIRUBINUR NEG 12/29/2015 1444   KETONESUR 5 (A) 10/12/2023 0930   PROTEINUR 30 (A) 10/12/2023 0930   UROBILINOGEN 0.2 12/08/2019 0000   UROBILINOGEN 0.2 04/17/2015 1618   NITRITE NEGATIVE 10/12/2023 0930   LEUKOCYTESUR NEGATIVE 10/12/2023 0930   Sepsis Labs: Invalid input(s): "PROCALCITONIN", "LACTICIDVEN"  Microbiology: Recent Results (from the past 240  hour(s))  Resp panel by RT-PCR (RSV, Flu A&B, Covid) Anterior Nasal Swab     Status: None   Collection Time: 10/12/23  8:29 AM   Specimen: Anterior Nasal Swab  Result Value Ref Range  Status   SARS Coronavirus 2 by RT PCR NEGATIVE NEGATIVE Final   Influenza A by PCR NEGATIVE NEGATIVE Final   Influenza B by PCR NEGATIVE NEGATIVE Final    Comment: (NOTE) The Xpert Xpress SARS-CoV-2/FLU/RSV plus assay is intended as an aid in the diagnosis of influenza from Nasopharyngeal swab specimens and should not be used as a sole basis for treatment. Nasal washings and aspirates are unacceptable for Xpert Xpress SARS-CoV-2/FLU/RSV testing.  Fact Sheet for Patients: BloggerCourse.com  Fact Sheet for Healthcare Providers: SeriousBroker.it  This test is not yet approved or cleared by the [place] FDA and has been authorized for detection and/or diagnosis of SARS-CoV-2 by FDA under an Emergency Use Authorization (EUA). This EUA will remain in effect (meaning this test can be used) for the duration of the COVID-19 declaration under Section 564(b)(1) of the Act, 21 U.S.C. section 360bbb-3(b)(1), unless the authorization is terminated or revoked.     Resp Syncytial Virus by PCR NEGATIVE NEGATIVE Final    Comment: (NOTE) Fact Sheet for Patients: BloggerCourse.com  Fact Sheet for Healthcare Providers: SeriousBroker.it  This test is not yet approved or cleared by the [place] FDA and has been authorized for detection and/or diagnosis of SARS-CoV-2 by FDA under an Emergency Use Authorization (EUA). This EUA will remain in effect (meaning this test can be used) for the duration of the COVID-19 declaration under Section 564(b)(1) of the Act, 21 U.S.C. section 360bbb-3(b)(1), unless the authorization is terminated or revoked.  Performed at [facility], [address], [place],  [place] [address]   Blood Culture (routine x 2)     Status: None (Preliminary result)   Collection Time: 10/12/23  8:29 AM   Specimen: BLOOD  Result Value Ref Range Status   Specimen Description BLOOD LEFT ANTECUBITAL  Final   Special Requests   Final    BOTTLES DRAWN AEROBIC AND ANAEROBIC Blood Culture results may not be optimal due to an inadequate volume of blood received in culture bottles   Culture   Final    NO GROWTH 2 DAYS Performed at [facility], [address], [place], [place] [address]    Report Status PENDING  Incomplete  Blood Culture (routine x 2)     Status: None (Preliminary result)   Collection Time: 10/12/23  8:34 AM   Specimen: BLOOD  Result Value Ref Range Status   Specimen Description BLOOD BLOOD RIGHT HAND  Final   Special Requests   Final    BOTTLES DRAWN AEROBIC AND ANAEROBIC Blood Culture adequate volume   Culture   Final    NO GROWTH 2 DAYS Performed at [facility], [address], [place], [place] [address]    Report Status PENDING  Incomplete  C Difficile Quick Screen w PCR reflex     Status: None   Collection Time: 10/12/23  5:57 PM   Specimen: STOOL  Result Value Ref Range Status   C Diff antigen NEGATIVE NEGATIVE Final   C Diff toxin NEGATIVE NEGATIVE Final   C Diff interpretation No C. difficile detected.  Final    Comment: Performed at [facility], [address], [place], [place] [address]    Radiology Studies: No results found.    Cicley Ganesh T. Temperence Zenor [facility]  If 7PM-7AM, please contact night-coverage [email or web address] 10/14/2023, 2:27 PM

## 2023-10-14 NOTE — Plan of Care (Signed)
Chart reviewed.  Blood counts are slowly improving.  White count is 4000 today.  Platelet 23,000.  CT abdomen and pelvis showed potential right hepatic abscess.  Additional workup pending with MRI.  She remains on broad-spectrum antibiotics.  No additional recommendations from oncology standpoint at this time.  Please call us with any questions or concerns.

## 2023-10-14 NOTE — Evaluation (Signed)
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  Last chemotherapy on 10/06/2023.    Patient and admitted with working diagnosis of neutropenic fever and diarrhea.  Clinical Impression   Pt admitted with above diagnosis. Lives at home with grandson, in a single-level home with 7 steps to enter; Prior to admission, pt was able to manage on 2 L supplemental O2, walking household distances, typically gets rides when she goes out; Has rollator, wc from last admission; Presents to PT with decr activity tolerance, generalized weakness;  Walked in her room today with contact guard assist; Rec re-starting HHPT; Pt currently with functional limitations due to the deficits listed below (see PT Problem List). Pt will benefit from skilled PT to increase their independence and safety with mobility to allow discharge to the venue listed below.           If plan is discharge home, recommend the following: A little help with walking and/or transfers;Direct supervision/assist for medications management;Direct supervision/assist for financial management;Assist for transportation;Help with stairs or ramp for entrance;Supervision due to cognitive status   Can travel by private vehicle        Equipment Recommendations None recommended by PT (I believe she is well-equipped from previous admission)  Recommendations for Other Services       Functional Status Assessment Patient has had a recent decline in their functional status and demonstrates the ability to make significant improvements in function in a  reasonable and predictable amount of time.     Precautions / Restrictions Precautions Precautions: Fall Precaution Comments: 2L o2 baseline      Mobility  Bed Mobility               General bed mobility comments: Sitting EOB upon PT arrival    Transfers Overall transfer level: Needs assistance Equipment used: 1 person hand held assist Transfers: Sit to/from Stand Sit to Stand: Contact guard assist           General transfer comment: Stood from EOB with contact gaurd assist for safety    Ambulation/Gait Ambulation/Gait assistance: Contact guard assist Gait Distance (Feet): 12 Feet (in room, around bed to recliner by window)) Assistive device: 1 person hand held assist         General Gait Details: Walked around bed on 2 L supplemental O2; no overt difficutly noted  Stairs            Wheelchair Mobility     Tilt Bed    Modified Rankin (Stroke Patients Only)       Balance     Sitting balance-Leahy Scale: Good       Standing balance-Leahy Scale: Fair                               Pertinent Vitals/Pain Pain Assessment Pain Assessment: No/denies pain Faces Pain Scale: No hurt Pain Intervention(s): Monitored during session    Home Living Family/patient expects to be discharged to:: Private residence Living Arrangements: Other relatives (grandson) Available Help at Discharge: Family Type of Home: Apartment Home Access: Stairs to enter Entrance  Stairs-Rails: Can reach both Entrance Stairs-Number of Steps: 7   Home Layout: One level Home Equipment: Rollator (4 wheels);Shower seat;Wheelchair - manual;Hospital bed Additional Comments: on 2L at home. Family reports making arrangements for 24/7 support    Prior Function Prior Level of Function : Needs assist             Mobility Comments: PRN use of w/c and rollator ADLs Comments: ind     Extremity/Trunk Assessment   Upper Extremity Assessment Upper Extremity  Assessment: Defer to OT evaluation    Lower Extremity Assessment Lower Extremity Assessment: Generalized weakness    Cervical / Trunk Assessment Cervical / Trunk Assessment: Normal  Communication   Communication Communication: No apparent difficulties  Cognition Arousal: Alert Behavior During Therapy: Flat affect Overall Cognitive Status: No family/caregiver present to determine baseline cognitive functioning                                 General Comments: slowed processing, keeps eyes clsoed, aware of need to use the bathroom and follows commands appropriately        General Comments General comments (skin integrity, edema, etc.): NAD on 2 L supplemental O2    Exercises     Assessment/Plan    PT Assessment Patient needs continued PT services  PT Problem List Decreased strength;Decreased activity tolerance;Decreased balance;Decreased mobility;Decreased knowledge of use of DME;Decreased cognition;Cardiopulmonary status limiting activity       PT Treatment Interventions DME instruction;Gait training;Functional mobility training;Therapeutic activities;Therapeutic exercise;Patient/family education;Balance training;Cognitive remediation    PT Goals (Current goals can be found in the Care Plan section)  Acute Rehab PT Goals Patient Stated Goal: to go home PT Goal Formulation: With patient/family Time For Goal Achievement: 10/28/23 Potential to Achieve Goals: Good    Frequency Min 1X/week     Co-evaluation               AM-PAC PT "6 Clicks" Mobility  Outcome Measure Help needed turning from your back to your side while in a flat bed without using bedrails?: None Help needed moving from lying on your back to sitting on the side of a flat bed without using bedrails?: None Help needed moving to and from a bed to a chair (including a wheelchair)?: A Little Help needed standing up from a chair using your arms (e.g., wheelchair or bedside chair)?: A  Little Help needed to walk in hospital room?: A Little Help needed climbing 3-5 steps with a railing? : A Lot 6 Click Score: 19    End of Session Equipment Utilized During Treatment: Gait belt;Oxygen Activity Tolerance: Patient tolerated treatment well Patient left: in chair;with call bell/phone within reach;with chair alarm set Nurse Communication: Mobility status;Other (comment) (pt requesting bed change) PT Visit Diagnosis: Muscle weakness (generalized) (M62.81);Difficulty in walking, not elsewhere classified (R26.2)    Time: 8657-8469 PT Time Calculation (min) (ACUTE ONLY): 10 min   Charges:   PT Evaluation $PT Eval Moderate Complexity: 1 Mod   PT General Charges $$ ACUTE PT VISIT: 1 Visit         Van Clines, PT  Acute Rehabilitation Services Office [phone] Secure Chat welcomed   Levi Aland 10/14/2023, 4:29 PM

## 2023-10-15 DIAGNOSIS — D696 Thrombocytopenia, unspecified: Secondary | ICD-10-CM | POA: Diagnosis not present

## 2023-10-15 DIAGNOSIS — D61818 Other pancytopenia: Secondary | ICD-10-CM | POA: Diagnosis not present

## 2023-10-15 DIAGNOSIS — D709 Neutropenia, unspecified: Secondary | ICD-10-CM | POA: Diagnosis not present

## 2023-10-15 DIAGNOSIS — C3491 Malignant neoplasm of unspecified part of right bronchus or lung: Secondary | ICD-10-CM | POA: Diagnosis not present

## 2023-10-15 LAB — CBC
HCT: 24.9 % — ABNORMAL LOW (ref 36.0–46.0)
Hemoglobin: 7.6 g/dL — ABNORMAL LOW (ref 12.0–15.0)
MCH: 25.5 pg — ABNORMAL LOW (ref 26.0–34.0)
MCHC: 30.5 g/dL (ref 30.0–36.0)
MCV: 83.6 fL (ref 80.0–100.0)
Platelets: 22 10*3/uL — CL (ref 150–400)
RBC: 2.98 MIL/uL — ABNORMAL LOW (ref 3.87–5.11)
RDW: 16.6 % — ABNORMAL HIGH (ref 11.5–15.5)
WBC: 6.3 10*3/uL (ref 4.0–10.5)
nRBC: 0 % (ref 0.0–0.2)

## 2023-10-15 LAB — RENAL FUNCTION PANEL
Albumin: 2.4 g/dL — ABNORMAL LOW (ref 3.5–5.0)
Anion gap: 8 (ref 5–15)
BUN: 12 mg/dL (ref 8–23)
CO2: 26 mmol/L (ref 22–32)
Calcium: 8.2 mg/dL — ABNORMAL LOW (ref 8.9–10.3)
Chloride: 105 mmol/L (ref 98–111)
Creatinine, Ser: 0.92 mg/dL (ref 0.44–1.00)
GFR, Estimated: >60 mL/min (ref >60)
Glucose, Bld: 130 mg/dL — ABNORMAL HIGH (ref 70–99)
Phosphorus: 1.8 mg/dL — ABNORMAL LOW (ref 2.5–4.6)
Potassium: 4 mmol/L (ref 3.5–5.1)
Sodium: 139 mmol/L (ref 135–145)

## 2023-10-15 LAB — APTT: aPTT: 34 s (ref 24–36)

## 2023-10-15 LAB — PROTIME-INR
INR: 1.6 — ABNORMAL HIGH (ref 0.8–1.2)
Prothrombin Time: 19.7 s — ABNORMAL HIGH (ref 11.4–15.2)

## 2023-10-15 LAB — MAGNESIUM: Magnesium: 1.6 mg/dL — ABNORMAL LOW (ref 1.7–2.4)

## 2023-10-15 MED ORDER — SODIUM PHOSPHATES 45 MMOLE/15ML IV SOLN
30.0000 mmol | Freq: Once | INTRAVENOUS | Status: AC
  Administered 2023-10-15: 30 mmol via INTRAVENOUS
  Filled 2023-10-15: qty 10

## 2023-10-15 MED ORDER — MAGNESIUM SULFATE 2 GM/50ML IV SOLN
2.0000 g | Freq: Once | INTRAVENOUS | Status: AC
  Administered 2023-10-15: 2 g via INTRAVENOUS
  Filled 2023-10-15: qty 50

## 2023-10-15 MED ORDER — PHYTONADIONE 5 MG PO TABS
5.0000 mg | ORAL_TABLET | Freq: Once | ORAL | Status: AC
  Administered 2023-10-15: 5 mg via ORAL
  Filled 2023-10-15: qty 1

## 2023-10-15 MED ORDER — CHLORHEXIDINE GLUCONATE CLOTH 2 % EX PADS
6.0000 | MEDICATED_PAD | Freq: Every day | CUTANEOUS | Status: DC
  Administered 2023-10-15 – 2023-10-19 (×5): 6 via TOPICAL

## 2023-10-15 NOTE — Progress Notes (Signed)
PROGRESS NOTE  HAGEN CANCILLA HYQ:[id number] DOB: 1961/02/29   PCP: [name]., FNP  Patient is from: Home.  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  Last chemotherapy on 10/06/2023.  In ED, she was febrile 200.6 and tachycardic to 136.  WBC 0.9 with ANC of 0.6 and ALC of 0.1.  K3.0.  Total bili 1.4.  Hgb 8.5.  Platelet 21 (normal baseline).  COVID-19, influenza and RSV PCR nonreactive.  INR 1.6.  UA and CXR without acute finding.  Lactic acid 1.2.  Mg 1.0.  C. difficile, blood cultures and CT abdomen and pelvis ordered.  Patient was started on vancomycin and cefepime, and admitted with working diagnosis of neutropenic fever and diarrhea.  CT abdomen and pelvis with new complex cystic areas of right hepatic lobe worrisome for potential abscess formation, indeterminate mildly complex small cystic area in the spleen and wall thickening with stranding along the descending colon.  Blood cultures NGTD.  MRI abdomen with 3 liver lesions with dominant focus in the right hepatic lobe that has rim enhancement as well as abnormal diffusion concerning for abscess over malignant lesion.  IR consulted.   Remains on broad-spectrum antibiotics pending MRI abdomen due to concern for possible hepatic abscess.  Leukopenia resolved.  Thrombocytopenia started to improve.  Anemia relatively stable    Subjective: Seen and examined earlier this morning.  No major events overnight of this morning.  Feels asleep.  Although she reports having good night sleep.  No other complaints.  Denies pain, nausea, vomiting or  respiratory symptoms.  Discussed CT and MRI finding and consultation with IR for possible biopsy.   Objective: Vitals:   10/15/23 0520 10/15/23 0749 10/15/23 0807 10/15/23 1048  BP: 137/81  126/72 123/70  Pulse: 68  70 90  Resp: 17   16  Temp: 98.2 F (36.8 C)  98.7 F (37.1 C)   TempSrc: Oral  Oral   SpO2: 100% 99% 100% 100%  Weight:      Height:        Examination:  GENERAL: No apparent distress.  Nontoxic.  Sitting on bedside chair. HEENT: MMM.  Vision and hearing grossly intact.  NECK: Supple.  No apparent JVD.  RESP:  No IWOB.  Fair aeration bilaterally. CVS:  RRR. Heart sounds normal.  ABD/GI/GU: BS+. Abd soft, NTND.  MSK/EXT:  Moves extremities. No apparent deformity. No edema.  SKIN: no apparent skin lesion or wound NEURO: Awake but not quite alert.  Oriented x 4.  No apparent focal neuro deficit. PSYCH: Calm. Normal affect.   Procedures:  None  Microbiology summarized: COVID-19, influenza and RSV PCR nonreactive Blood cultures NGTD C. difficile negative  Assessment and plan: Neutropenic fever: Likely due to recent chemotherapy.  Febrile to 100.6 and tachycardic to 136 on arrival.  Infectious workup unrevealing except for CT abdomen and pelvis concerning for possible hepatic and splenic abscess and colitis.  MRI abdomen also raises concern for possible liver abscess and/or lesion.  Leukopenia and neutropenia resolved.  Improving clinically. -Continue IV cefepime, vancomycin and Flagyl -IR consulted about liver abscess/lesion and planning biopsy  on 12/3. -May need platelet transfusion and vitamin K.  Will check coag labs today -Appreciate help by oncology   Pancytopenia: On 11/19, WBC 12, Hgb 9.5 and platelet 189.  Likely due to recent chemo.  Improved Recent Labs  Lab 10/12/23 0845 10/13/23 0443 10/14/23 0940 10/15/23 0744  WBC 0.9* 1.9* 4.0 6.3  NEUTROABS 0.6* 1.6*  --   --   HGB 8.5* 7.2* 7.9* 7.6*  HCT 27.2* 23.3* 25.2* 24.9*  MCV 85.3 84.1 84.6 83.6   PLT 21* 14* 23* 22*  -Continue monitoring -Verbally consented for blood products if indicated. -Appreciate help by hematology/oncology  Stage IV NSCLC with brain and osseous mets-on radiation and chemotherapy.  Last chemo on 11/23. -Appreciate input by hematology/oncology -Continue home Keppra -Resumed Decadron at 4 mg TID.  Takes QID at home. Oncology recommends holding but cannot stop abruptly.  -Pain control  Diarrhea/colitis: Likely from chemo.  Abdominal exam benign.  C. difficile negative.  CT abdomen and pelvis raises concern for colitis.  No further diarrhea. -Antibiotics as above -Continue Imodium as needed  Hypokalemia/hypomagnesemia: Likely due to diarrhea -Monitor replenish as appropriate  Fatigue: Multifactorial including chemotherapy and acute illness. -PT/OT   Chronic COPD/sarcoidosis/hypoxic RF: On 2 L at baseline.  Stable. -Continue home Singulair and breathing treatments -Agree with holding Plaquenil   Essential hypertension: Normotensive for most part. -Metoprolol 25 mg twice daily.  Not sure why she is taking once daily at home.   GERD -Protonix 40 mg daily   HLD -Atorvastatin 20 mg daily   Anxiety and depression: Stable -Zoloft 50 mg daily  History of breast cancer -Outpatient follow-up  Body mass index is 26.46 kg/m.           DVT prophylaxis:  SCDs Start: 10/12/23 1436  Code Status: DNR/DNI Family Communication: None at bedside Level of care: Telemetry Medical Status is: Inpatient Remains inpatient appropriate because: Neutropenic fever, possible hepatic abscess and colitis   Final disposition: Likely home Consultants:  Oncology/hematology Interventional radiology  55 minutes with more than 50% spent in reviewing records, counseling patient/family and coordinating care.   Sch Meds:  Scheduled Meds:  arformoterol  15 mcg Nebulization BID   And   umeclidinium bromide  1 puff Inhalation Daily   atorvastatin  20 mg Oral Daily    Chlorhexidine Gluconate Cloth  6 each Topical Daily   dexamethasone  4 mg Oral Q8H   levETIRAcetam  750 mg Oral BID   metoprolol tartrate  25 mg Oral BID   montelukast  10 mg Oral QHS   multivitamin with minerals  1 tablet Oral Q breakfast   pantoprazole  40 mg Oral Daily   sertraline  50 mg Oral Daily   sodium chloride flush  10 mL Intravenous Q12H   Continuous Infusions:  ceFEPime (MAXIPIME) IV 2 g (10/15/23 0537)   metronidazole 500 mg (10/15/23 0844)   sodium phosphate 30 mmol in dextrose 5 % 250 mL infusion 30 mmol (10/15/23 1226)   vancomycin 1,250 mg (10/15/23 1045)   PRN Meds:.acetaminophen **OR** acetaminophen, albuterol, hydrALAZINE, loperamide, ondansetron **OR** ondansetron (ZOFRAN) IV, oxyCODONE-acetaminophen, senna-docusate  Antimicrobials: Anti-infectives (From admission, onward)    Start     Dose/Rate Route Frequency Ordered Stop   10/15/23 0800  vancomycin (VANCOREADY) IVPB 1250 mg/250 mL        1,250 mg 166.7 mL/hr over 90 Minutes Intravenous Every 24 hours 10/14/23 1813     10/13/23 2000  metroNIDAZOLE (FLAGYL) IVPB 500 mg  500 mg 100 mL/hr over 60 Minutes Intravenous Every 12 hours 10/13/23 1904     10/13/23 2000  vancomycin (VANCOCIN) IVPB 1000 mg/200 mL premix  Status:  Discontinued        1,000 mg 200 mL/hr over 60 Minutes Intravenous Every 12 hours 10/13/23 1918 10/14/23 1813   10/13/23 1000  hydroxychloroquine (PLAQUENIL) tablet 200 mg  Status:  Discontinued        200 mg Oral Daily 10/12/23 1830 10/12/23 1922   10/12/23 2130  ceFEPIme (MAXIPIME) 2 g in sodium chloride 0.9 % 100 mL IVPB        2 g 200 mL/hr over 30 Minutes Intravenous Every 8 hours 10/12/23 2015     10/12/23 1215  vancomycin (VANCOREADY) IVPB 1750 mg/350 mL        1,750 mg 175 mL/hr over 120 Minutes Intravenous  Once 10/12/23 1203 10/12/23 1624   10/12/23 1115  piperacillin-tazobactam (ZOSYN) IVPB 3.375 g        3.375 g 100 mL/hr over 30 Minutes Intravenous  Once 10/12/23  1112 10/12/23 1312   10/12/23 0830  cefTRIAXone (ROCEPHIN) 2 g in sodium chloride 0.9 % 100 mL IVPB  Status:  Discontinued        2 g 200 mL/hr over 30 Minutes Intravenous  Once 10/12/23 0829 10/12/23 1150   10/12/23 0830  metroNIDAZOLE (FLAGYL) IVPB 500 mg        500 mg 100 mL/hr over 60 Minutes Intravenous  Once 10/12/23 0829 10/12/23 1149        I have personally reviewed the following labs and images: CBC: Recent Labs  Lab 10/12/23 0845 10/13/23 0443 10/14/23 0940 10/15/23 0744  WBC 0.9* 1.9* 4.0 6.3  NEUTROABS 0.6* 1.6*  --   --   HGB 8.5* 7.2* 7.9* 7.6*  HCT 27.2* 23.3* 25.2* 24.9*  MCV 85.3 84.1 84.6 83.6  PLT 21* 14* 23* 22*   BMP &GFR Recent Labs  Lab 10/12/23 0845 10/12/23 1622 10/13/23 0443 10/14/23 0817 10/15/23 0744  NA 141  --  142 140 139  K 3.0*  --  2.7* 4.5 4.0  CL 93*  --  103 104 105  CO2 35*  --  30 25 26   GLUCOSE 76  --  77 144* 130*  BUN 12  --  11 13 12   CREATININE 0.88  --  0.72 1.12* 0.92  CALCIUM 7.5*  --  7.1* 7.8* 8.2*  MG  --  1.0* 1.6* 1.7 1.6*  PHOS  --   --  2.5  --  1.8*   Estimated Creatinine Clearance: 62.2 mL/min (by C-G formula based on SCr of 0.92 mg/dL). Liver & Pancreas: Recent Labs  Lab 10/12/23 0845 10/13/23 0443 10/14/23 0817 10/15/23 0744  AST 21 15 12*  --   ALT 13 14 12   --   ALKPHOS 47 43 50  --   BILITOT 1.4* 0.8 0.8  --   PROT 5.1* 4.8* 5.2*  --   ALBUMIN 2.5* 2.2* 2.3* 2.4*   No results for input(s): "LIPASE", "AMYLASE" in the last 168 hours. No results for input(s): "AMMONIA" in the last 168 hours. Diabetic: No results for input(s): "HGBA1C" in the last 72 hours. No results for input(s): "GLUCAP" in the last 168 hours. Cardiac Enzymes: No results for input(s): "CKTOTAL", "CKMB", "CKMBINDEX", "TROPONINI" in the last 168 hours. No results for input(s): "PROBNP" in the last 8760 hours. Coagulation Profile: Recent Labs  Lab 10/12/23 0845  INR 1.6*   Thyroid Function  Tests: No results for  input(s): "TSH", "T4TOTAL", "FREET4", "T3FREE", "THYROIDAB" in the last 72 hours. Lipid Profile: No results for input(s): "CHOL", "HDL", "LDLCALC", "TRIG", "CHOLHDL", "LDLDIRECT" in the last 72 hours. Anemia Panel: No results for input(s): "VITAMINB12", "FOLATE", "FERRITIN", "TIBC", "IRON", "RETICCTPCT" in the last 72 hours. Urine analysis:    Component Value Date/Time   COLORURINE YELLOW 10/12/2023 0930   APPEARANCEUR HAZY (A) 10/12/2023 0930   LABSPEC 1.010 10/12/2023 0930   PHURINE 7.0 10/12/2023 0930   GLUCOSEU NEGATIVE 10/12/2023 0930   HGBUR MODERATE (A) 10/12/2023 0930   HGBUR small 05/13/2010 1505   BILIRUBINUR NEGATIVE 10/12/2023 0930   BILIRUBINUR negative 12/08/2019 0000   BILIRUBINUR NEG 12/29/2015 1444   KETONESUR 5 (A) 10/12/2023 0930   PROTEINUR 30 (A) 10/12/2023 0930   UROBILINOGEN 0.2 12/08/2019 0000   UROBILINOGEN 0.2 04/17/2015 1618   NITRITE NEGATIVE 10/12/2023 0930   LEUKOCYTESUR NEGATIVE 10/12/2023 0930   Sepsis Labs: Invalid input(s): "PROCALCITONIN", "LACTICIDVEN"  Microbiology: Recent Results (from the past 240 hour(s))  Resp panel by RT-PCR (RSV, Flu A&B, Covid) Anterior Nasal Swab     Status: None   Collection Time: 10/12/23  8:29 AM   Specimen: Anterior Nasal Swab  Result Value Ref Range Status   SARS Coronavirus 2 by RT PCR NEGATIVE NEGATIVE Final   Influenza A by PCR NEGATIVE NEGATIVE Final   Influenza B by PCR NEGATIVE NEGATIVE Final    Comment: (NOTE) The Xpert Xpress SARS-CoV-2/FLU/RSV plus assay is intended as an aid in the diagnosis of influenza from Nasopharyngeal swab specimens and should not be used as a sole basis for treatment. Nasal washings and aspirates are unacceptable for Xpert Xpress SARS-CoV-2/FLU/RSV testing.  Fact Sheet for Patients: BloggerCourse.com  Fact Sheet for Healthcare Providers: SeriousBroker.it  This test is not yet approved or cleared by the [place] FDA  and has been authorized for detection and/or diagnosis of SARS-CoV-2 by FDA under an Emergency Use Authorization (EUA). This EUA will remain in effect (meaning this test can be used) for the duration of the COVID-19 declaration under Section 564(b)(1) of the Act, 21 U.S.C. section 360bbb-3(b)(1), unless the authorization is terminated or revoked.     Resp Syncytial Virus by PCR NEGATIVE NEGATIVE Final    Comment: (NOTE) Fact Sheet for Patients: BloggerCourse.com  Fact Sheet for Healthcare Providers: SeriousBroker.it  This test is not yet approved or cleared by the [place] FDA and has been authorized for detection and/or diagnosis of SARS-CoV-2 by FDA under an Emergency Use Authorization (EUA). This EUA will remain in effect (meaning this test can be used) for the duration of the COVID-19 declaration under Section 564(b)(1) of the Act, 21 U.S.C. section 360bbb-3(b)(1), unless the authorization is terminated or revoked.  Performed at [facility], [address], [place], [place] [address]   Blood Culture (routine x 2)     Status: None (Preliminary result)   Collection Time: 10/12/23  8:29 AM   Specimen: BLOOD  Result Value Ref Range Status   Specimen Description BLOOD LEFT ANTECUBITAL  Final   Special Requests   Final    BOTTLES DRAWN AEROBIC AND ANAEROBIC Blood Culture results may not be optimal due to an inadequate volume of blood received in culture bottles   Culture   Final    NO GROWTH 3 DAYS Performed at [facility], [address], [place], [place] [address]    Report Status PENDING  Incomplete  Blood Culture (routine x 2)  Status: None (Preliminary result)   Collection Time: 10/12/23  8:34 AM   Specimen: BLOOD  Result Value Ref Range Status   Specimen Description BLOOD BLOOD RIGHT HAND  Final   Special Requests   Final    BOTTLES DRAWN AEROBIC AND ANAEROBIC Blood Culture adequate volume    Culture   Final    NO GROWTH 3 DAYS Performed at [facility], [address] [place], [place] [address]    Report Status PENDING  Incomplete  C Difficile Quick Screen w PCR reflex     Status: None   Collection Time: 10/12/23  5:57 PM   Specimen: STOOL  Result Value Ref Range Status   C Diff antigen NEGATIVE NEGATIVE Final   C Diff toxin NEGATIVE NEGATIVE Final   C Diff interpretation No C. difficile detected.  Final    Comment: Performed at [facility], [address], [place], [place] [address]    Radiology Studies: MR ABDOMEN W WO CONTRAST  Result Date: 10/14/2023 CLINICAL DATA:  Liver lesion EXAM: MRI ABDOMEN WITHOUT AND WITH CONTRAST TECHNIQUE: Multiplanar multisequence MR imaging of the abdomen was performed both before and after the administration of intravenous contrast. CONTRAST:  7mL GADAVIST GADOBUTROL 1 MMOL/ML IV SOLN COMPARISON:  CT 10/13/2023.  Older CT 02/19/2019. FINDINGS: Lower chest: Tiny left and small right pleural effusion. Heart enlarged is some linear signal changes in the lung bases. Atelectasis or scarring. Hepatobiliary: No biliary ductal dilatation. Gallbladder is nondilated. Patent portal vein. Complex lesion seen posteriorly in the right hepatic lobe segment 6/7 is again identified today. This lesion has a fluid-fluid level measures 16 by 12 mm. There is some marginal enhancement of the lesion as well as some mild adjacent edema. Again differential would include an abscess. Smaller lesion identified just anterior and superior to this lesion is dark on precontrast T1, bright on T2 and also has some marginal enhancement measuring 12 mm. The lesion seen in segment 7 more superiorly is less visible today but again there is significant breathing motion Pancreas: No mass, inflammatory changes, or other parenchymal abnormality identified. Spleen: Small splenic lesion again identified posteriorly which is dark on T1, moderately bright on T2. Assessment of  enhancement is difficult with motion. Again lesion measures 8 mm. Adrenals/Urinary Tract: Adrenal glands are preserved. Benign bilateral renal cystic foci are identified. Stomach/Bowel: Visualized bowel is nondilated. The stranding along the course of the mid descending colon is again noted. Vascular/Lymphatic: Mild atherosclerotic changes along the aorta. Normal caliber aorta and IVC. No discrete abnormal lymph node enlargement in the visualized abdomen. Other:  Evaluation significantly limited by motion. Musculoskeletal: Degenerative changes along the spine. IMPRESSION: Three liver lesions identified. Once again the dominant focus posteriorly in the right hepatic lobe has a rim enhancement as well as abnormal diffusion. This has concerning for abscess over a malignant lesion. The 2 other smaller lesions are more indeterminate and would recommend close follow-up. Indeterminate small splenic lesion.  Attention on follow-up. Small right and tiny left pleural effusion. Evaluation significantly limited by motion. Electronically Signed   By: Karen Kays M.D.   On: 10/14/2023 17:14      Camora Tremain T. Jerrold [facility]  If 7PM-7AM, please contact night-coverage [email or web address] 10/15/2023, 1:37 PM

## 2023-10-15 NOTE — Plan of Care (Signed)

## 2023-10-15 NOTE — Consult Note (Signed)
Chief Complaint: Liver lesion  Referring Provider(s): Candelaria Stagers  Supervising Physician: Ruel Favors  Patient Status: [facility] - In-pt  History of Present Illness: [name] is a 62 y.o. female  with history of stage IV NSCLC with brain mets currently on chemoradiation, Br Ca, sarcoidosis, COPD, chronic hypoxic RF on 2 L, HTN, anxiety and depression.  She presented to the ED on 10/12/23 with fatigue.  Workup = Labs on admission - WBC 0.9, K3.0.  Total bili 1.4.  Hgb 8.5.  Platelets 21.   INR 1.6.     CT abdomen and pelvis with new complex cystic areas of right hepatic lobe worrisome for potential abscess formation, indeterminate mildly complex small cystic area in the spleen and wall thickening with stranding along the descending colon.    MRI abdomen with 3 liver lesions with dominant focus in the right hepatic lobe that has rim enhancement as well as abnormal diffusion concerning for abscess over malignant lesion.   We are asked to perform biopsy/aspiration of a liver lesion.  Patient is DNR  Past Medical History:  Diagnosis Date   Acute on chronic respiratory failure (HCC) 10/09/2016   Allergic rhinitis    Breast cancer (HCC) 2016   right breast   COPD, mild (HCC) FOLLOWED BY DR ZOXW   Depression    GERD (gastroesophageal reflux disease)    HTN (hypertension)    Hypertension    Iron deficiency anemia    Long-term current use of steroids SYMBICORT INHALER   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    Overweight (BMI 25.0-29.9) 05/11/2016   Palpitations    Personal history of radiation therapy 2016   Sarcoidosis STABLE PER CXR JUNE 2013   Shortness of breath    Sickle cell trait (HCC)     Past Surgical History:  Procedure Laterality Date   ADJUSTABLE SUTURE MANIPULATION  05/22/2012   Procedure: ADJUSTABLE SUTURE MANIPULATION;  Surgeon: Corinda Gubler, MD;  Location: [facility];  Service: Ophthalmology;  Laterality: Right;   BREAST LUMPECTOMY Right 2016    BRONCHIAL BIOPSY  07/09/2023   Procedure: BRONCHIAL BIOPSIES;  Surgeon: Leslye Peer, MD;  Location: [facility] ENDOSCOPY;  Service: Pulmonary;;   BRONCHIAL BRUSHINGS  07/09/2023   Procedure: BRONCHIAL BRUSHINGS;  Surgeon: Leslye Peer, MD;  Location: [facility] ENDOSCOPY;  Service: Pulmonary;;   BRONCHIAL NEEDLE ASPIRATION BIOPSY  07/09/2023   Procedure: BRONCHIAL NEEDLE ASPIRATION BIOPSIES;  Surgeon: Leslye Peer, MD;  Location: [facility] ENDOSCOPY;  Service: Pulmonary;;   CESAREAN SECTION  1986   W/ BILATERAL TUBAL LIGATION   COLONOSCOPY WITH PROPOFOL N/A 02/21/2019   Procedure: COLONOSCOPY WITH PROPOFOL;  Surgeon: Hilarie Fredrickson, MD;  Location: [facility] ENDOSCOPY;  Service: Endoscopy;  Laterality: N/A;   EYE SURGERY     IR IMAGING GUIDED PORT INSERTION  08/01/2023   MEDIAN RECTUS REPAIR  05/22/2012   Procedure: MEDIAN RECTUS REPAIR;  Surgeon: Corinda Gubler, MD;  Location: [facility];  Service: Ophthalmology;  Laterality: Bilateral;  INFERIOR RECTUS RESECTION WITH ADJUSTIBLE SUTURES RIGHT EYE    POLYPECTOMY  02/21/2019   Procedure: POLYPECTOMY;  Surgeon: Hilarie Fredrickson, MD;  Location: [facility] ENDOSCOPY;  Service: Endoscopy;;   RADIOACTIVE SEED GUIDED PARTIAL MASTECTOMY WITH AXILLARY SENTINEL LYMPH NODE BIOPSY Right 08/18/2015   Procedure: RADIOACTIVE SEED GUIDED PARTIAL MASTECTOMY WITH AXILLARY SENTINEL LYMPH NODE BIOPSY;  Surgeon: Almond Lint, MD;  Location: [facility];  Service: General;  Laterality: Right;   TOTAL ROBOTIC ASSISTED LAPAROSCOPIC  HYSTERECTOMY  12-30-2010   SYMPTOMATIC UTERINE FIBROIDS   UPPER TEETH EXTRACTION'S  1992   VIDEO BRONCHOSCOPY WITH RADIAL ENDOBRONCHIAL ULTRASOUND  07/09/2023   Procedure: VIDEO BRONCHOSCOPY WITH RADIAL ENDOBRONCHIAL ULTRASOUND;  Surgeon: Leslye Peer, MD;  Location: [facility] ENDOSCOPY;  Service: Pulmonary;;    Allergies: Codeine  Medications: Prior to Admission medications   Medication Sig Start Date End Date Taking? Authorizing Provider   acetaminophen (TYLENOL) 325 MG tablet Take 650 mg by mouth every 6 (six) hours as needed for mild pain or moderate pain.   Yes [provider]  albuterol (VENTOLIN HFA) 108 (90 Base) MCG/ACT inhaler INHALE 2 PUFFS EVERY 6 HOURS AS NEEDED FOR WHEEZING OR SHORTNESS OF BREATH Patient taking differently: Inhale 2 puffs into the lungs every 6 (six) hours as needed for shortness of breath or wheezing. 09/23/19  Yes Nyoka Cowden, MD  aspirin 81 MG chewable tablet Chew 1 tablet (81 mg total) by mouth daily. 09/22/23  Yes Swayze, Ava, DO  atorvastatin (LIPITOR) 20 MG tablet TAKE 1 TABLET (20 MG TOTAL) BY MOUTH DAILY. 03/28/21  Yes Sandre Kitty, MD  dexamethasone (DECADRON) 4 MG tablet Take 1 tablet (4 mg total) by mouth every 6 (six) hours. 09/21/23  Yes Swayze, Ava, DO  fluticasone (FLONASE) 50 MCG/ACT nasal spray USE 2 SPRAYS IN BOTH  NOSTRILS DAILY AS NEEDED  FOR ALLERGIES Patient taking differently: Place 2 sprays into both nostrils daily as needed for allergies. 08/15/21  Yes Nyoka Cowden, MD  hydroxychloroquine (PLAQUENIL) 200 MG tablet Take 200 mg by mouth daily. 09/24/23  Yes [provider]  levETIRAcetam (KEPPRA) 750 MG tablet Take 1 tablet (750 mg total) by mouth 2 (two) times daily. 09/21/23  Yes Swayze, Ava, DO  metoprolol tartrate (LOPRESSOR) 25 MG tablet Take 1 tablet (25 mg total) by mouth daily. 07/09/23  Yes Leslye Peer, MD  montelukast (SINGULAIR) 10 MG tablet Take 10 mg by mouth at bedtime. 07/31/22  Yes [provider]  Multiple Vitamin (MULTIVITAMIN) capsule Take 1 capsule by mouth daily with breakfast.   Yes [provider]  omeprazole (PRILOSEC) 20 MG capsule TAKE 1 CAPSULE EVERY DAY Patient taking differently: Take 20 mg by mouth daily before breakfast. 01/03/21  Yes Sandre Kitty, MD  ondansetron (ZOFRAN) 4 MG tablet Take 1 tablet (4 mg total) by mouth every 6 (six) hours as needed for nausea. 06/28/23  Yes Alwyn Ren, MD  OXYGEN  Inhale 2-3 L/min into the lungs See admin instructions. Inhale 2-3 L/min into the lungs CONTINUOUSLY; 2 L/min at rest and 3 L/min if exerted   Yes [provider]  pantoprazole (PROTONIX) 40 MG tablet Take 40 mg by mouth daily. 09/21/23  Yes [provider]  prochlorperazine (COMPAZINE) 10 MG tablet Take 1 tablet (10 mg total) by mouth every 6 (six) hours as needed for nausea or vomiting. 08/01/23  Yes Si Gaul, MD  sertraline (ZOLOFT) 50 MG tablet TAKE 1 TABLET EVERY DAY Patient taking differently: Take 50 mg by mouth daily. 09/11/20  Yes Sandre Kitty, MD  STIOLTO RESPIMAT 2.5-2.5 MCG/ACT AERS USE 2 INHALATIONS BY MOUTH DAILY 09/17/23  Yes Nyoka Cowden, MD  albuterol (PROVENTIL) (2.5 MG/3ML) 0.083% nebulizer solution Take 3 mLs (2.5 mg total) by nebulization every 6 (six) hours as needed for wheezing or shortness of breath. Patient not taking: Reported on 10/12/2023 12/22/18   Osvaldo Shipper, MD  lidocaine-prilocaine (EMLA) cream Apply to affected area once Patient taking differently: Apply  1 Application topically See admin instructions. Apply to affected area once as directed 08/01/23   Si Gaul, MD     Family History  Problem Relation Age of Onset   Hyperlipidemia Mother    Asthma Mother    Lupus Mother    Breast cancer Mother 76   Hypertension Father    Hyperlipidemia Father    Hypertension Sister    Hypertension Brother    Cancer Neg Hx    Diabetes Neg Hx    Coronary artery disease Neg Hx     Social History   Socioeconomic History   Marital status: Single    Spouse name: Not on file   Number of children: Not on file   Years of education: Not on file   Highest education level: Not on file  Occupational History   Not on file  Tobacco Use   Smoking status: Former    Current packs/day: 0.00    Average packs/day: 1 pack/day for 20.0 years (20.0 ttl pk-yrs)    Types: Cigarettes    Start date: 11/14/1983    Quit date: 11/14/2003    Years since  quitting: 19.9   Smokeless tobacco: Never  Vaping Use   Vaping status: Never Used  Substance and Sexual Activity   Alcohol use: No   Drug use: No   Sexual activity: Not on file  Other Topics Concern   Not on file  Social History Narrative   Lives with mother and his 3 children.  Works as a Psychologist, educational for sewing Administrator, arts)         Social Determinants of Corporate investment banker Strain: Not on file  Food Insecurity: No Food Insecurity (10/12/2023)   Hunger Vital Sign    Worried About Running Out of Food in the Last Year: Never true    Ran Out of Food in the Last Year: Never true  Transportation Needs: No Transportation Needs (10/12/2023)   PRAPARE - Administrator, Civil Service (Medical): No    Lack of Transportation (Non-Medical): No  Physical Activity: Not on file  Stress: Not on file  Social Connections: Not on file     Review of Systems: A 12 point ROS discussed and pertinent positives are indicated in the HPI above.  All other systems are negative.   Vital Signs: BP 123/70 (BP Location: Right Arm)   Pulse 90   Temp 98.7 F (37.1 C) (Oral)   Resp 16   Ht 5\' 5"  (1.651 m)   Wt 159 lb (72.1 kg)   SpO2 100%   BMI 26.46 kg/m   Advance Care Plan: The advanced care place/surrogate decision maker was discussed at the time of visit and the patient did not wish to discuss or was not able to name a surrogate decision maker or provide an advance care plan.  Physical Exam Vitals reviewed.  Constitutional:      Appearance: Normal appearance.  HENT:     Head: Normocephalic and atraumatic.  Eyes:     Extraocular Movements: Extraocular movements intact.  Cardiovascular:     Rate and Rhythm: Normal rate and regular rhythm.  Pulmonary:     Effort: Pulmonary effort is normal. No respiratory distress.     Breath sounds: Normal breath sounds.  Abdominal:     Palpations: Abdomen is soft.  Musculoskeletal:        General: Normal range of motion.     Cervical  back: Normal range of motion.  Skin:  General: Skin is warm and dry.  Neurological:     General: No focal deficit present.     Mental Status: She is alert and oriented to person, place, and time.  Psychiatric:        Mood and Affect: Mood normal.        Behavior: Behavior normal.        Thought Content: Thought content normal.        Judgment: Judgment normal.     Imaging: CLINICAL DATA:  Liver lesion   EXAM: MRI ABDOMEN WITHOUT AND WITH CONTRAST   TECHNIQUE: Multiplanar multisequence MR imaging of the abdomen was performed both before and after the administration of intravenous contrast.   CONTRAST:  7mL GADAVIST GADOBUTROL 1 MMOL/ML IV SOLN   COMPARISON:  CT 10/13/2023.  Older CT 02/19/2019.   FINDINGS: Lower chest: Tiny left and small right pleural effusion. Heart enlarged is some linear signal changes in the lung bases. Atelectasis or scarring.   Hepatobiliary: No biliary ductal dilatation. Gallbladder is nondilated. Patent portal vein. Complex lesion seen posteriorly in the right hepatic lobe segment 6/7 is again identified today. This lesion has a fluid-fluid level measures 16 by 12 mm. There is some marginal enhancement of the lesion as well as some mild adjacent edema. Again differential would include an abscess. Smaller lesion identified just anterior and superior to this lesion is dark on precontrast T1, bright on T2 and also has some marginal enhancement measuring 12 mm. The lesion seen in segment 7 more superiorly is less visible today but again there is significant breathing motion   Pancreas: No mass, inflammatory changes, or other parenchymal abnormality identified.   Spleen: Small splenic lesion again identified posteriorly which is dark on T1, moderately bright on T2. Assessment of enhancement is difficult with motion. Again lesion measures 8 mm.   Adrenals/Urinary Tract: Adrenal glands are preserved. Benign bilateral renal cystic foci are  identified.   Stomach/Bowel: Visualized bowel is nondilated. The stranding along the course of the mid descending colon is again noted.   Vascular/Lymphatic: Mild atherosclerotic changes along the aorta. Normal caliber aorta and IVC. No discrete abnormal lymph node enlargement in the visualized abdomen.   Other:  Evaluation significantly limited by motion.   Musculoskeletal: Degenerative changes along the spine.   IMPRESSION: Three liver lesions identified. Once again the dominant focus posteriorly in the right hepatic lobe has a rim enhancement as well as abnormal diffusion. This has concerning for abscess over a malignant lesion. The 2 other smaller lesions are more indeterminate and would recommend close follow-up.   Indeterminate small splenic lesion.  Attention on follow-up.   Small right and tiny left pleural effusion.   Evaluation significantly limited by motion.     Electronically Signed   By: Karen Kays M.D.   On: 10/14/2023 17:14   CLINICAL DATA:  Lung cancer.  Sepsis.   EXAM: CT ABDOMEN AND PELVIS WITH CONTRAST   TECHNIQUE: Multidetector CT imaging of the abdomen and pelvis was performed using the standard protocol following bolus administration of intravenous contrast.   RADIATION DOSE REDUCTION: This exam was performed according to the departmental dose-optimization program which includes automated exposure control, adjustment of the mA and/or kV according to patient size and/or use of iterative reconstruction technique.   CONTRAST:  75mL OMNIPAQUE IOHEXOL 300 MG/ML  SOLN   COMPARISON:  PET-CT 08/03/2023. standard CT April 2020   FINDINGS: Lower chest: Bronchiectasis seen along the lung bases. Breathing motion. Dependent areas of atelectasis and  scarring. Trace pleural fluid on the right. Right-sided fat containing diaphragmatic hernia again seen.   Hepatobiliary: Nodular contours of the liver. Cystic lesion posterior along the right hepatic  lobe on series 3, image 21 has a capsule and some associated parenchymal edema surrounding lesion measuring 19 x 16 mm. Small focus just above this well but more simple appearing measuring 10 mm and a vague low-density area in segment 7 measuring 7 mm on series 3, image 10. These are not clearly seen on the previous examinations. Patent portal vein. Gallbladder is mildly distended.   Pancreas: Unremarkable. No pancreatic ductal dilatation or surrounding inflammatory changes.   Spleen: Spleen is nonenlarged. There is a cystic area posterior along the spleen which was not seen previously measuring 8 mm. Not clearly a simple cyst   Adrenals/Urinary Tract: Adrenal glands are preserved. Benign bilateral renal cystic foci identified, Bosniak 1 and 2 lesions. No enhancing mass or collecting system dilatation. The ureters have normal course and caliber extending down to the urinary bladder. Preserved contours of the urinary bladder.   Stomach/Bowel: Stomach is decompressed. Small bowel is nondilated. The large bowel has a normal course and caliber. Left-sided areas of wall thickening identified with diverticula and stranding. Please correlate for a colitis. Mild changes as well along the sigmoid and rectum. Fluid along the lumen of the right side of the colon normal appendix seen in the right hemipelvis. Posteromedial to the cecum.   Vascular/Lymphatic: Aortic atherosclerosis. No enlarged abdominal or pelvic lymph nodes.   Reproductive: Status post hysterectomy. No adnexal masses.   Other: Anasarca.   Musculoskeletal: Degenerative changes seen of the spine and pelvis particularly of the right hip.   IMPRESSION: New complex cystic areas in the right hepatic lobe. Based on appearance this has worrisome for potential abscess formation but there is a differential. Please correlate with clinical presentation. If needed MRI evaluation could be considered. Largest measures 19 mm in the  right hepatic lobe.   Small cystic area in the spleen is mildly complex and more indeterminate. This also could be assessed on follow-up.   Wall thickening with stranding along the descending colon greater than the rectosigmoid colon. Please correlate for a colitis. No obstruction. Recommend follow-up.   Tiny right pleural effusion.     Electronically Signed   By: Karen Kays M.D.   On: 10/13/2023 14:21    Labs:  CBC: Recent Labs    10/12/23 0845 10/13/23 0443 10/14/23 0940 10/15/23 0744  WBC 0.9* 1.9* 4.0 6.3  HGB 8.5* 7.2* 7.9* 7.6*  HCT 27.2* 23.3* 25.2* 24.9*  PLT 21* 14* 23* 22*    COAGS: Recent Labs    10/12/23 0845  INR 1.6*  APTT 36    BMP: Recent Labs    10/12/23 0845 10/13/23 0443 10/14/23 0817 10/15/23 0744  NA 141 142 140 139  K 3.0* 2.7* 4.5 4.0  CL 93* 103 104 105  CO2 35* 30 25 26   GLUCOSE 76 77 144* 130*  BUN 12 11 13 12   CALCIUM 7.5* 7.1* 7.8* 8.2*  CREATININE 0.88 0.72 1.12* 0.92  GFRNONAA >60 >60 55* >60    LIVER FUNCTION TESTS: Recent Labs    10/02/23 1033 10/12/23 0845 10/13/23 0443 10/14/23 0817 10/15/23 0744  BILITOT 0.5 1.4* 0.8 0.8  --   AST 16 21 15  12*  --   ALT 19 13 14 12   --   ALKPHOS 59 47 43 50  --   PROT 5.4* 5.1* 4.8* 5.2*  --  ALBUMIN 3.1* 2.5* 2.2* 2.3* 2.4*    TUMOR MARKERS: No results for input(s): "AFPTM", "CEA", "CA199", "CHROMGRNA" in the last 8760 hours.  Assessment and Plan:  Liver lesion concerning for metastatic disease.  Will proceed with image guided biopsy versus aspiration tomorrow by Dr. Lowella Dandy.  Risks and benefits of liver lesion biopsy/aspiration was discussed with the patient and/or patient's family including, but not limited to bleeding, infection, damage to adjacent structures or low yield requiring additional tests.  All of the questions were answered and there is agreement to proceed.  Consent signed and in chart.  Patient currently has DNR order in place. Discussion with  the patient regarding wishes.  The original DNR order is maintained and prior treatment limitations are upheld.  *Patient will need Platelet infusion prior to procedure* Dr. Alanda Slim is aware - will plan for procedure around 1000 so platelets can be given accordingly. He will also give Vitamin K in hopes of correcting slightly elevated INR (1.6).  NPO after MN order in place. INR order in place.  Thank you for allowing our service to participate in RUBYROSE FECTEAU 's care.  Electronically Signed: Gwynneth Macleod, PA-C   10/15/2023, 2:21 PM      I spent a total of 40 Minutes  in face to face in clinical consultation, greater than 50% of which was counseling/coordinating care for liver lesion biopsy/aspiration.

## 2023-10-15 NOTE — Progress Notes (Signed)
Physical Therapy Treatment Patient Details Name: [name] MRN: [id number] DOB: 05-11-1961 Today's Date: 10/15/2023   History of Present Illness 62 year old F admitted 11/29 with working diagnosis of neutropenic fever and diarrhea. 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  Last chemotherapy on 10/06/2023.    PT Comments  Progressing towards goals. Ambulated 80 feet with one standing rest break. CGA with rollator, good control. SpO2 96% on 2L supplemental O2. Amb in room on RA at 93% SpO2. She is very fatigued today. Struggled to stand from toilet, needing at least min assist with rail to pull on. May benefit from [facility] v riser at d/c. Will defer thoughts to OT during next visit; I've left a note for them. Pt declined practicing steps today; feels she will have adequate support from son and grandson to assist into home. Will continue to offer training for more independence when navigating steps. Patient will continue to benefit from skilled physical therapy services to further improve independence with functional mobility.     If plan is discharge home, recommend the following: A little help with walking and/or transfers;Direct supervision/assist for medications management;Direct supervision/assist for financial management;Assist for transportation;Help with stairs or ramp for entrance;Supervision due to cognitive status   Can travel by private vehicle        Equipment Recommendations  None recommended by PT    Recommendations for Other Services       Precautions / Restrictions Precautions Precautions: Fall Precaution Comments: 2L o2 baseline Restrictions Weight Bearing Restrictions: No     Mobility  Bed Mobility Overal bed mobility:  Modified Independent Bed Mobility: Supine to Sit, Sit to Supine     Supine to sit: Modified independent (Device/Increase time)     General bed mobility comments: Mod I, extra time to rise to EOB. Slow and effortful    Transfers Overall transfer level: Needs assistance Equipment used: Rollator (4 wheels) Transfers: Sit to/from Stand Sit to Stand: Contact guard assist, Min assist           General transfer comment: CGA to rise from bed when cued for hand placement, to push up from bed rather than pull through rollator. From toilet pt requires at least min assist, even with rail, for boost to stand, due to LE weakness from low surface.    Ambulation/Gait Ambulation/Gait assistance: Contact guard assist Gait Distance (Feet): 80 Feet Assistive device: Rollator (4 wheels) Gait Pattern/deviations: Decreased stride length, Decreased step length - right, Decreased step length - left Gait velocity: dec Gait velocity interpretation: <1.31 ft/sec, indicative of household ambulator   General Gait Details: Slow gait but good RW control. Delayed processing when following simple instructions for directions. Initially on RA, pt 93%, became fatigued and 2L applied to finish distance. Required 1 standing rest break to complete with rollator (familiar device used at home.) No buckling, mild sway, CGA for safety. SpO2 96% on 2L.   Stairs Stairs:  (Declines to practice; states her son and grandson plan to assist.)           Wheelchair Mobility     Tilt Bed    Modified Rankin (Stroke Patients Only)       Balance Overall balance assessment: Needs assistance Sitting-balance support: No upper extremity supported, Feet supported Sitting balance-Leahy Scale:  Good     Standing balance support: No upper extremity supported, Reliant on assistive device for balance, During functional activity Standing balance-Leahy Scale: Fair Standing balance comment: More stable with RW for support                             Cognition Arousal: Alert Behavior During Therapy: Flat affect Overall Cognitive Status: No family/caregiver present to determine baseline cognitive functioning                     Current Attention Level: Sustained           General Comments: slowed processing, aware of need to use the bathroom and follows commands appropriately        Exercises      General Comments General comments (skin integrity, edema, etc.): RA SpO2 95% at rest. Short bout in room on RA, 93%. Ambulating in hallway with 2L, 96%. HR 110s. Very fatigued with minimal activity.      Pertinent Vitals/Pain Pain Assessment Pain Assessment: No/denies pain Pain Intervention(s): Monitored during session    Home Living                          Prior Function            PT Goals (current goals can now be found in the care plan section) Acute Rehab PT Goals Patient Stated Goal: to go home PT Goal Formulation: With patient/family Time For Goal Achievement: 10/28/23 Potential to Achieve Goals: Good Progress towards PT goals: Progressing toward goals    Frequency    Min 1X/week      PT Plan      Co-evaluation              AM-PAC PT "6 Clicks" Mobility   Outcome Measure  Help needed turning from your back to your side while in a flat bed without using bedrails?: None Help needed moving from lying on your back to sitting on the side of a flat bed without using bedrails?: None Help needed moving to and from a bed to a chair (including a wheelchair)?: A Little Help needed standing up from a chair using your arms (e.g., wheelchair or bedside chair)?: A Little Help needed to walk in hospital room?: A Little Help needed climbing 3-5 steps with a railing? : A Lot 6 Click Score: 19    End of Session Equipment Utilized During Treatment: Gait belt;Oxygen Activity Tolerance: Patient tolerated treatment well Patient left: in chair;with call  bell/phone within reach;with chair alarm set   PT Visit Diagnosis: Muscle weakness (generalized) (M62.81);Difficulty in walking, not elsewhere classified (R26.2)     Time: 4098-1191 PT Time Calculation (min) (ACUTE ONLY): 27 min  Charges:    $Gait Training: 8-22 mins $Therapeutic Activity: 8-22 mins PT General Charges $$ ACUTE PT VISIT: 1 Visit                     Kathlyn Sacramento, PT, DPT [facility]  Rehabilitation Services Physical Therapist Office: [phone] Website: [email or web address]    Berton Mount 10/15/2023, 10:40 AM

## 2023-10-16 ENCOUNTER — Ambulatory Visit: Payer: 59 | Admitting: Physician Assistant

## 2023-10-16 DIAGNOSIS — K769 Liver disease, unspecified: Secondary | ICD-10-CM | POA: Diagnosis not present

## 2023-10-16 DIAGNOSIS — D61818 Other pancytopenia: Secondary | ICD-10-CM | POA: Diagnosis not present

## 2023-10-16 DIAGNOSIS — C3491 Malignant neoplasm of unspecified part of right bronchus or lung: Secondary | ICD-10-CM | POA: Diagnosis not present

## 2023-10-16 DIAGNOSIS — R5081 Fever presenting with conditions classified elsewhere: Secondary | ICD-10-CM | POA: Diagnosis not present

## 2023-10-16 DIAGNOSIS — D709 Neutropenia, unspecified: Secondary | ICD-10-CM | POA: Diagnosis not present

## 2023-10-16 DIAGNOSIS — D696 Thrombocytopenia, unspecified: Secondary | ICD-10-CM | POA: Diagnosis not present

## 2023-10-16 LAB — RENAL FUNCTION PANEL
Albumin: 2.3 g/dL — ABNORMAL LOW (ref 3.5–5.0)
Anion gap: 11 (ref 5–15)
BUN: 13 mg/dL (ref 8–23)
CO2: 25 mmol/L (ref 22–32)
Calcium: 8.2 mg/dL — ABNORMAL LOW (ref 8.9–10.3)
Chloride: 104 mmol/L (ref 98–111)
Creatinine, Ser: 1.28 mg/dL — ABNORMAL HIGH (ref 0.44–1.00)
GFR, Estimated: 47 mL/min — ABNORMAL LOW (ref >60)
Glucose, Bld: 100 mg/dL — ABNORMAL HIGH (ref 70–99)
Phosphorus: 2.9 mg/dL (ref 2.5–4.6)
Potassium: 3.3 mmol/L — ABNORMAL LOW (ref 3.5–5.1)
Sodium: 140 mmol/L (ref 135–145)

## 2023-10-16 LAB — CBC
HCT: 23.4 % — ABNORMAL LOW (ref 36.0–46.0)
Hemoglobin: 7.3 g/dL — ABNORMAL LOW (ref 12.0–15.0)
MCH: 26.1 pg (ref 26.0–34.0)
MCHC: 31.2 g/dL (ref 30.0–36.0)
MCV: 83.6 fL (ref 80.0–100.0)
Platelets: 21 10*3/uL — CL (ref 150–400)
RBC: 2.8 MIL/uL — ABNORMAL LOW (ref 3.87–5.11)
RDW: 16.7 % — ABNORMAL HIGH (ref 11.5–15.5)
WBC: 10.6 10*3/uL — ABNORMAL HIGH (ref 4.0–10.5)
nRBC: 0.2 % (ref 0.0–0.2)

## 2023-10-16 LAB — MAGNESIUM: Magnesium: 1.8 mg/dL (ref 1.7–2.4)

## 2023-10-16 LAB — PROTIME-INR
INR: 1.6 — ABNORMAL HIGH (ref 0.8–1.2)
Prothrombin Time: 19.1 s — ABNORMAL HIGH (ref 11.4–15.2)

## 2023-10-16 LAB — VANCOMYCIN, PEAK: Vancomycin Pk: >60 ug/mL (ref 30–40)

## 2023-10-16 MED ORDER — POTASSIUM CHLORIDE CRYS ER 20 MEQ PO TBCR
40.0000 meq | EXTENDED_RELEASE_TABLET | ORAL | Status: AC
  Administered 2023-10-16 (×2): 40 meq via ORAL
  Filled 2023-10-16 (×2): qty 2

## 2023-10-16 MED ORDER — SODIUM CHLORIDE 0.9% IV SOLUTION: Freq: Once | INTRAVENOUS | Status: AC

## 2023-10-16 MED ORDER — VITAMIN K1 10 MG/ML IJ SOLN
1.0000 mg | Freq: Once | INTRAVENOUS | Status: AC
  Administered 2023-10-16: 1 mg via INTRAVENOUS
  Filled 2023-10-16: qty 0.1

## 2023-10-16 MED ORDER — SODIUM CHLORIDE 0.9% FLUSH
10.0000 mL | Freq: Two times a day (BID) | INTRAVENOUS | Status: DC
Start: 2023-10-16 — End: 2023-10-19
  Administered 2023-10-16 – 2023-10-19 (×5): 10 mL

## 2023-10-16 MED ORDER — VANCOMYCIN HCL 1250 MG/250ML IV SOLN: 1250.0000 mg | INTRAVENOUS | Status: DC

## 2023-10-16 MED ORDER — SODIUM CHLORIDE 0.9 % IV SOLN
3.0000 g | Freq: Four times a day (QID) | INTRAVENOUS | Status: DC
  Administered 2023-10-16 – 2023-10-18 (×7): 3 g via INTRAVENOUS
  Filled 2023-10-16 (×6): qty 8

## 2023-10-16 MED ORDER — SODIUM CHLORIDE 0.9% FLUSH: 10.0000 mL | INTRAVENOUS | Status: DC | PRN

## 2023-10-16 NOTE — Plan of Care (Signed)

## 2023-10-16 NOTE — Progress Notes (Signed)
Pt has deaccessed her port a cath.  Huber needle found intact.  Pt keeps pulling her tele leads off and has pulled out another peripheral IV tonight.  She is alert and oriented x4 but states when she sleeps she does it and is not aware of what she is doing.  New 20g insert to rt forearm and wrapped in kerlix and secured with tape.  IV team consulted fro [place].  Tele leads replaced.

## 2023-10-16 NOTE — Progress Notes (Signed)
Port still in place.  Pt pulled and deaccessed the port.

## 2023-10-16 NOTE — Care Management Important Message (Signed)
Important Message  Patient Details  Name: SOON LUCADO MRN: [id number] Date of Birth: 1961-01-14   Important Message Given:  Yes - Medicare IM  Due to Illness patient could not sign/ A signed copy by me was left a the patient bedside.    Basia Mcginty 10/16/2023, 3:12 PM

## 2023-10-16 NOTE — Progress Notes (Signed)
   10/16/23 1139  What Happened  Was fall witnessed? No  Was patient injured? No  Patient found on floor  Provider Notification  Provider Name/Title Dr. Candelaria Stagers MD  Date Provider Notified 10/16/23  Time Provider Notified 1142  Method of Notification  (Secure chat)  Notification Reason Fall  Provider response No new orders  Follow Up  Family notified Yes - comment  Time family notified 1149  Additional tests No  Simple treatment Ice  Progress note created (see row info) Yes  Blank note created Yes  Adult Fall Risk Assessment  Risk Factor Category (scoring not indicated) High fall risk per protocol (document High fall risk)  Patient Fall Risk Level High fall risk  Adult Fall Risk Interventions  Required Bundle Interventions *See Row Information* High fall risk - low, moderate, and high requirements implemented  Additional Interventions Room near nurses station;Use of appropriate toileting equipment (bedpan, BSC, etc.);PT/OT need assessed if change in mobility from baseline  Fall intervention(s) refused/Patient educated regarding refusal Bed alarm;Nonskid socks;Open door if unsupervised;Supervision while toileting/edge of bed sitting  Screening for Fall Injury Risk (To be completed on HIGH fall risk patients) - Assessing Need for Floor Mats  Risk For Fall Injury- Criteria for Floor Mats Previous fall this admission;Bleeding risk-anticoagulation (not prophylaxis);Noncompliant with safety precautions  Will Implement Floor Mats Yes  Vitals  BP 136/78  MAP (mmHg) 91  Pulse Rate 75  Pulse Rate Source Dinamap  Oxygen Therapy  SpO2 100 %  O2 Device Nasal Cannula  O2 Flow Rate (L/min) 2 L/min  Pain Assessment  Pain Scale 0-10  Pain Score 7  Faces Pain Scale 0  Pain Type Acute pain  Pain Location Leg  Pain Orientation Left  Pain Descriptors / Indicators Aching  Patients Stated Pain Goal 5  Pain Intervention(s) MD notified (Comment);Refused;Repositioned  Multiple Pain Sites No   PCA/Epidural/Spinal Assessment  Respiratory Pattern Regular  Neurological  Neuro (WDL) WDL  Level of Consciousness Alert  Orientation Level Oriented X4  Cognition Impulsive;Poor judgement;Poor safety awareness  Speech Clear  R Pupil Size (mm) 3  R Pupil Shape Round  R Pupil Reaction Brisk  L Pupil Size (mm) 3  L Pupil Shape Round  L Pupil Reaction Brisk  Neuro Symptoms Fatigue;Forgetful  Neuro symptoms relieved by Rest  Glasgow Coma Scale  Eye Opening 4  Best Verbal Response (NON-intubated) 5  Best Motor Response 6  Glasgow Coma Scale Score 15  Musculoskeletal  Musculoskeletal (WDL) X  Assistive Device Front wheel walker  Generalized Weakness Yes  Weight Bearing Restrictions No  Integumentary  Integumentary (WDL) X  Skin Integrity Ecchymosis  Ecchymosis Location Arm  Ecchymosis Location Orientation Bilateral  Pain Assessment  Date Pain First Started 10/16/23  Result of Injury Yes  Pain Assessment  Work-Related Injury No  Pain Screening  Response to Interventions "My leg hurts" Then closed her eyes and went back to sleep.  Effect of Pain on Daily Activities None. Patient able to walk back to the bed to lay down.  Clinical Progression Not changed   Notified daughter [name].  Patient did have chair alarm on, staff was unable to get to the patient quick enough. Patient is now resting comfortably in bed, call light within reach, bed alarm on, and all needs met.

## 2023-10-16 NOTE — Progress Notes (Signed)
PROGRESS NOTE  [name] ZOX:[id number] DOB: [birth year]   PCP: [name]., FNP  Patient is from: Home.  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  Last chemotherapy on 10/06/2023.  In ED, she was febrile 200.6 and tachycardic to 136.  WBC 0.9 with ANC of 0.6 and ALC of 0.1.  K3.0.  Total bili 1.4.  Hgb 8.5.  Platelet 21 (normal baseline).  COVID-19, influenza and RSV PCR nonreactive.  INR 1.6.  UA and CXR without acute finding.  Lactic acid 1.2.  Mg 1.0.  C. difficile, blood cultures and CT abdomen and pelvis ordered.  Patient was started on vancomycin and cefepime, and admitted with working diagnosis of neutropenic fever and diarrhea.  CT abdomen and pelvis with new complex cystic areas of right hepatic lobe worrisome for potential abscess formation, indeterminate mildly complex small cystic area in the spleen and wall thickening with stranding along the descending colon.  Blood cultures NGTD.  MRI abdomen with 3 liver lesions with dominant focus in the right hepatic lobe that has rim enhancement as well as abnormal diffusion concerning for abscess over malignant lesion.  IR consulted for liver lesion/abscess biopsy.   Remains on broad-spectrum antibiotics pending MRI abdomen due to concern for possible hepatic abscess.  Leukopenia resolved.  Thrombocytopenia started to improve.  Anemia relatively stable.  ID consulted for guidance.  Subjective: Seen and examined earlier this morning.  No major events overnight.  Patient found on the floor later in the morning about 11:42 AM.  No significant  injury other than mild ecchymosis in bilateral arms.  No focal neurodeficit.  Fairly oriented but poor judgment and insight.  Objective: Vitals:   10/16/23 1241 10/16/23 1256 10/16/23 1351 10/16/23 1415  BP: (!) 151/75 133/73 130/69 121/62  Pulse: 70 61 68 72  Resp: 16 16 16 16   Temp: 98.7 F (37.1 C) 98.7 F (37.1 C) 99 F (37.2 C) 98.1 F (36.7 C)  TempSrc: Oral Oral Oral Oral  SpO2: 100% 100% 100% 100%  Weight:      Height:        Examination:  GENERAL: No apparent distress.  Nontoxic.  Sitting on bedside chair. HEENT: MMM.  Vision and hearing grossly intact.  NECK: Supple.  No apparent JVD.  RESP:  No IWOB.  Fair aeration bilaterally. CVS:  RRR. Heart sounds normal.  ABD/GI/GU: BS+. Abd soft, NTND.  MSK/EXT:  Moves extremities. No apparent deformity. No edema.  SKIN: no apparent skin lesion or wound NEURO: Awake but not quite alert.  Oriented x 4.  No apparent focal neuro deficit. PSYCH: Calm. Normal affect.   Procedures:  None  Microbiology summarized: COVID-19, influenza and RSV PCR nonreactive Blood cultures NGTD C. difficile negative  Assessment and plan: Neutropenic fever: Likely due to recent chemotherapy.  Febrile to 100.6 and tachycardic to 136 on arrival.  Infectious workup unrevealing except for CT abdomen and pelvis concerning for possible hepatic and splenic abscess and colitis.  MRI abdomen also raises concern for possible liver abscess and/or lesion.  Leukopenia and neutropenia resolved.  Now with a leukocytosis.  -Continue  IV cefepime, vancomycin and Flagyl -IR consulted about liver abscess/lesion.  Biopsy to be performed after platelet transfusion and INR reversal -Ordered 2 units of irradiated platelets and 1 mg of IV vitamin K -Consult ID -Appreciate help by oncology-signed off.   Pancytopenia: On 11/19, WBC 12, Hgb 9.5 and platelet 189.  Likely due to recent chemo.  Improved Recent Labs  Lab 10/12/23 0845 10/13/23 0443 10/14/23 0940  10/15/23 0744 10/16/23 0600  WBC 0.9* 1.9* 4.0 6.3 10.6*  NEUTROABS 0.6* 1.6*  --   --   --   HGB 8.5* 7.2* 7.9* 7.6* 7.3*  HCT 27.2* 23.3* 25.2* 24.9* 23.4*  MCV 85.3 84.1 84.6 83.6 83.6  PLT 21* 14* 23* 22* 21*  -Continue monitoring -Transfuse 2 units of irradiated platelets for liver biopsy procedure -Appreciate help by hematology/oncology  Stage IV NSCLC with brain and osseous mets-on radiation and chemotherapy.  Last chemo on 11/23. -Appreciate input by hematology/oncology -Continue home Keppra -Resumed Decadron at 4 mg TID.  Takes QID at home. Oncology recommends holding but cannot stop abruptly.  -Pain control  Diarrhea/colitis: Likely from chemo.  Abdominal exam benign.  C. difficile negative.  CT abdomen and pelvis raises concern for colitis.  No further diarrhea. -Antibiotics as above -Continue Imodium as needed  AKI: Baseline Cr 0.6-0.8.  Due to vancomycin? Recent Labs    09/19/23 0419 09/20/23 0437 09/21/23 0928 09/27/23 0918 10/02/23 1033 10/12/23 0845 10/13/23 0443 10/14/23 0817 10/15/23 0744 10/16/23 0600  BUN 20 15 13 15 12 12 11 13 12 13   CREATININE 1.06* 0.95 0.92 0.76 0.64 0.88 0.72 1.12* 0.92 1.28*  -Consulted ID for guidance on antibiotics. -Continue monitoring  Hypokalemia/hypomagnesemia: Likely due to diarrhea -Monitor replenish as appropriate  Fatigue: Multifactorial including chemotherapy and acute illness. -PT/OT  Fall in the hospital: Found down on the floor late morning of 12/3.  Has some old looking bruises to bilateral arm.  She is oriented x 4 but not quite alert even before fall.  No focal neurodeficit. -Frequent neurocheck -Fall precaution   Chronic COPD/sarcoidosis/hypoxic RF: On 2 L at baseline.  Stable. -Continue home Singulair and breathing treatments -Agree with holding Plaquenil   Essential hypertension: Normotensive for most part. -Metoprolol 25 mg twice daily.  Not sure why she is taking once daily at home.    GERD -Protonix 40 mg daily   HLD -Atorvastatin 20 mg daily   Anxiety and depression: Stable -Zoloft 50 mg daily  History of breast cancer -Outpatient follow-up  Body mass index is 26.46 kg/m.           DVT prophylaxis:  SCDs Start: 10/12/23 1436  Code Status: DNR/DNI Family Communication: None at bedside Level of care: Telemetry Medical Status is: Inpatient Remains inpatient appropriate because: Neutropenic fever, possible hepatic abscess and colitis   Final disposition: Likely home Consultants:  Oncology/hematology Interventional radiology Infectious disease  55 minutes with more than 50% spent in reviewing records, counseling patient/family and coordinating care.   Sch Meds:  Scheduled Meds:  arformoterol  15 mcg Nebulization BID   And   umeclidinium bromide  1 puff Inhalation Daily   atorvastatin  20 mg Oral Daily   Chlorhexidine Gluconate Cloth  6 each Topical Daily   dexamethasone  4 mg Oral Q8H   levETIRAcetam  750 mg Oral BID   metoprolol tartrate  25 mg Oral BID   montelukast  10 mg Oral QHS   multivitamin with minerals  1 tablet Oral Q breakfast   pantoprazole  40 mg Oral Daily   sertraline  50 mg Oral Daily   sodium chloride flush  10 mL Intravenous Q12H   sodium chloride flush  10-40 mL Intracatheter Q12H   Continuous Infusions:  ceFEPime (MAXIPIME) IV 2 g (10/16/23 1409)   metronidazole 500 mg (10/16/23 0816)   vancomycin 1,250 mg (10/16/23 1101)   PRN Meds:.acetaminophen **OR** acetaminophen, albuterol, hydrALAZINE, loperamide, ondansetron **OR** ondansetron (ZOFRAN) IV, oxyCODONE-acetaminophen, senna-docusate  Antimicrobials: Anti-infectives (From admission, onward)    Start     Dose/Rate Route Frequency Ordered Stop   10/15/23 0800  vancomycin (VANCOREADY) IVPB 1250 mg/250 mL        1,250 mg 166.7 mL/hr over 90 Minutes Intravenous Every 24 hours 10/14/23 1813     10/13/23 2000  metroNIDAZOLE (FLAGYL) IVPB 500 mg        500  mg 100 mL/hr over 60 Minutes Intravenous Every 12 hours 10/13/23 1904     10/13/23 2000  vancomycin (VANCOCIN) IVPB 1000 mg/200 mL premix  Status:  Discontinued        1,000 mg 200 mL/hr over 60 Minutes Intravenous Every 12 hours 10/13/23 1918 10/14/23 1813   10/13/23 1000  hydroxychloroquine (PLAQUENIL) tablet 200 mg  Status:  Discontinued        200 mg Oral Daily 10/12/23 1830 10/12/23 1922   10/12/23 2130  ceFEPIme (MAXIPIME) 2 g in sodium chloride 0.9 % 100 mL IVPB        2 g 200 mL/hr over 30 Minutes Intravenous Every 8 hours 10/12/23 2015     10/12/23 1215  vancomycin (VANCOREADY) IVPB 1750 mg/350 mL        1,750 mg 175 mL/hr over 120 Minutes Intravenous  Once 10/12/23 1203 10/12/23 1624   10/12/23 1115  piperacillin-tazobactam (ZOSYN) IVPB 3.375 g        3.375 g 100 mL/hr over 30 Minutes Intravenous  Once 10/12/23 1112 10/12/23 1312   10/12/23 0830  cefTRIAXone (ROCEPHIN) 2 g in sodium chloride 0.9 % 100 mL IVPB  Status:  Discontinued        2 g 200 mL/hr over 30 Minutes Intravenous  Once 10/12/23 0829 10/12/23 1150   10/12/23 0830  metroNIDAZOLE (FLAGYL) IVPB 500 mg        500 mg 100 mL/hr over 60 Minutes Intravenous  Once 10/12/23 0829 10/12/23 1149        I have personally reviewed the following labs and images: CBC: Recent Labs  Lab 10/12/23 0845 10/13/23 0443 10/14/23 0940 10/15/23 0744 10/16/23 0600  WBC 0.9* 1.9* 4.0 6.3 10.6*  NEUTROABS 0.6* 1.6*  --   --   --   HGB 8.5* 7.2* 7.9* 7.6* 7.3*  HCT 27.2* 23.3* 25.2* 24.9* 23.4*  MCV 85.3 84.1 84.6 83.6 83.6  PLT 21* 14* 23* 22* 21*   BMP &GFR Recent Labs  Lab 10/12/23 0845 10/12/23 1622 10/13/23 0443 10/14/23 0817 10/15/23 0744 10/16/23 0600  NA 141  --  142 140 139 140  K 3.0*  --  2.7* 4.5 4.0 3.3*  CL 93*  --  103 104 105 104  CO2 35*  --  30 25 26 25   GLUCOSE 76  --  77 144* 130* 100*  BUN 12  --  11 13 12 13   CREATININE 0.88  --  0.72 1.12* 0.92 1.28*  CALCIUM 7.5*  --  7.1* 7.8* 8.2* 8.2*   MG  --  1.0* 1.6* 1.7 1.6* 1.8  PHOS  --   --  2.5  --  1.8* 2.9   Estimated Creatinine Clearance: 44.7 mL/min (A) (by C-G formula based on SCr of 1.28 mg/dL (H)). Liver & Pancreas: Recent Labs  Lab 10/12/23 0845 10/13/23 0443 10/14/23 0817 10/15/23 0744 10/16/23 0600  AST 21 15 12*  --   --   ALT 13 14 12   --   --   ALKPHOS 47 43 50  --   --   BILITOT 1.4* 0.8 0.8  --   --   PROT 5.1* 4.8* 5.2*  --   --   ALBUMIN 2.5* 2.2* 2.3* 2.4* 2.3*   No results for input(s): "LIPASE", "AMYLASE" in the last 168 hours. No results for input(s): "AMMONIA" in the last 168 hours. Diabetic: No results for input(s): "HGBA1C" in the last 72 hours. No results for input(s): "GLUCAP" in the last 168 hours. Cardiac Enzymes: No results for input(s): "CKTOTAL", "CKMB", "CKMBINDEX", "TROPONINI" in the last 168 hours. No results for input(s): "PROBNP" in the last 8760 hours. Coagulation Profile: Recent Labs  Lab 10/12/23 0845 10/15/23 1516 10/16/23 0600  INR 1.6* 1.6* 1.6*   Thyroid Function Tests: No results for input(s): "TSH", "T4TOTAL", "FREET4", "T3FREE", "THYROIDAB" in the last 72 hours. Lipid Profile: No results for input(s): "CHOL", "HDL", "LDLCALC", "TRIG", "CHOLHDL", "LDLDIRECT" in the last 72 hours. Anemia Panel: No results for input(s): "VITAMINB12", "FOLATE", "FERRITIN", "TIBC", "IRON", "RETICCTPCT" in the last 72 hours. Urine analysis:    Component Value Date/Time   COLORURINE YELLOW 10/12/2023 0930   APPEARANCEUR HAZY (A) 10/12/2023 0930   LABSPEC 1.010 10/12/2023 0930   PHURINE 7.0 10/12/2023 0930   GLUCOSEU NEGATIVE 10/12/2023 0930   HGBUR MODERATE (A) 10/12/2023 0930   HGBUR small 05/13/2010 1505   BILIRUBINUR NEGATIVE 10/12/2023 0930   BILIRUBINUR negative 12/08/2019 0000   BILIRUBINUR NEG 12/29/2015 1444   KETONESUR 5 (A) 10/12/2023 0930   PROTEINUR 30 (A) 10/12/2023 0930   UROBILINOGEN 0.2 12/08/2019 0000   UROBILINOGEN 0.2 04/17/2015 1618   NITRITE NEGATIVE  10/12/2023 0930   LEUKOCYTESUR NEGATIVE 10/12/2023 0930   Sepsis Labs: Invalid input(s): "PROCALCITONIN", "LACTICIDVEN"  Microbiology: Recent Results (from the past 240 hour(s))  Resp panel by RT-PCR (RSV, Flu A&B, Covid) Anterior Nasal Swab     Status: None   Collection Time: 10/12/23  8:29 AM   Specimen: Anterior Nasal Swab  Result Value Ref Range Status   SARS Coronavirus 2 by RT PCR NEGATIVE NEGATIVE Final   Influenza A by PCR NEGATIVE NEGATIVE Final   Influenza B by PCR NEGATIVE NEGATIVE Final    Comment: (NOTE) The Xpert Xpress SARS-CoV-2/FLU/RSV plus assay is intended as an aid in the diagnosis of influenza from Nasopharyngeal swab specimens and should not be used as a sole basis for treatment. Nasal washings and aspirates are unacceptable for Xpert Xpress SARS-CoV-2/FLU/RSV testing.  Fact Sheet for Patients: BloggerCourse.com  Fact Sheet for Healthcare Providers: SeriousBroker.it  This test is not yet approved or cleared by the [place] FDA and has been authorized for detection and/or diagnosis of SARS-CoV-2 by FDA under an Emergency Use Authorization (EUA). This EUA will remain in effect (meaning this test can be used) for the duration of the COVID-19 declaration under Section 564(b)(1) of the Act, 21 U.S.C. section 360bbb-3(b)(1), unless the authorization is terminated or revoked.     Resp Syncytial Virus by PCR NEGATIVE NEGATIVE Final    Comment: (NOTE) Fact Sheet for Patients: BloggerCourse.com  Fact Sheet for Healthcare Providers: SeriousBroker.it  This test is not yet approved or cleared by the [place]  [place] FDA and has been authorized for detection and/or diagnosis of SARS-CoV-2 by FDA under an Emergency Use Authorization (EUA). This EUA will remain in effect (meaning this test can be used) for the duration of the COVID-19 declaration under Section  564(b)(1) of the Act, 21 U.S.C. section 360bbb-3(b)(1), unless the authorization is terminated or revoked.  Performed at [facility], [address], [place], [place] [address]   Blood Culture (routine x 2)     Status: None (Preliminary result)   Collection Time: 10/12/23  8:29 AM   Specimen: BLOOD  Result Value Ref Range Status   Specimen Description BLOOD LEFT ANTECUBITAL  Final   Special Requests   Final    BOTTLES DRAWN AEROBIC AND ANAEROBIC Blood Culture results may not be optimal due to an inadequate volume of blood received in culture bottles   Culture   Final    NO GROWTH 4 DAYS Performed at [facility], [address], [place], [place] [address]    Report Status PENDING  Incomplete  Blood Culture (routine x 2)     Status: None (Preliminary result)   Collection Time: 10/12/23  8:34 AM   Specimen: BLOOD  Result Value Ref Range Status   Specimen Description BLOOD BLOOD RIGHT HAND  Final   Special Requests   Final    BOTTLES DRAWN AEROBIC AND ANAEROBIC Blood Culture adequate volume   Culture   Final    NO GROWTH 4 DAYS Performed at [facility], [address], [place], [place] [address]    Report Status PENDING  Incomplete  C Difficile Quick Screen w PCR reflex     Status: None   Collection Time: 10/12/23  5:57 PM   Specimen: STOOL  Result Value Ref Range Status   C Diff antigen NEGATIVE NEGATIVE Final   C Diff toxin NEGATIVE NEGATIVE Final   C Diff interpretation No C. difficile detected.  Final    Comment: Performed at [facility], [address], [place], [place] [address]    Radiology Studies: No results found.    Jaiden Dinkins T. Jaylan Hinojosa [facility]  If 7PM-7AM, please contact night-coverage [email or web address] 10/16/2023, 2:32 PM

## 2023-10-16 NOTE — Progress Notes (Signed)
Occupational Therapy Treatment Patient Details Name: [name] MRN: [id number] DOB: August 21, 1961 Today's Date: 10/16/2023   History of present illness 62 year old F admitted 11/29 with working diagnosis of neutropenic fever and diarrhea. 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  Last chemotherapy on 10/06/2023.   OT comments  Pt progressing towards goals this session, able to ambulate to bathroom with RW on baseline 2L O2 with CGA. Pt mod I for bed mobility and completing ADLs with supervision - min A, able to stand for grooming tasks and completes seated UB/LB dressing task. Pt presenting with impairments listed below, will follow acutely. Continue to recommend HHOT at d/c.       If plan is discharge home, recommend the following:  A little help with walking and/or transfers;A little help with bathing/dressing/bathroom;Assistance with cooking/housework;Assist for transportation;Help with stairs or ramp for entrance   Equipment Recommendations  BSC/3in1    Recommendations for Other Services PT consult    Precautions / Restrictions Precautions Precautions: Fall Precaution Comments: 2L o2 baseline Restrictions Weight Bearing Restrictions: No       Mobility Bed Mobility Overal bed mobility: Modified Independent                  Transfers Overall transfer level: Needs assistance Equipment used: Rolling walker (2 wheels) Transfers: Sit to/from Stand Sit to Stand: Contact guard assist, Min assist                 Balance Overall balance assessment: Needs assistance Sitting-balance support: No upper extremity supported, Feet supported Sitting balance-Leahy Scale: Good     Standing balance support: No upper extremity  supported, Reliant on assistive device for balance, During functional activity Standing balance-Leahy Scale: Fair Standing balance comment: static standing at sink without AD                           ADL either performed or assessed with clinical judgement   ADL Overall ADL's : Needs assistance/impaired     Grooming: Oral care;Wash/dry hands;Standing           Upper Body Dressing : Minimal assistance;Sitting Upper Body Dressing Details (indicate cue type and reason): donning clean gown Lower Body Dressing: Set up Lower Body Dressing Details (indicate cue type and reason): donning socks Toilet Transfer: Contact guard Nurse, adult Details (indicate cue type and reason): BSC Over toilet Toileting- Clothing Manipulation and Hygiene: Supervision/safety Toileting - Clothing Manipulation Details (indicate cue type and reason): seated pericare     Functional mobility during ADLs: Contact guard assist;Rolling walker (2 wheels)      Extremity/Trunk Assessment Upper Extremity Assessment Upper Extremity Assessment: Generalized weakness   Lower Extremity Assessment Lower Extremity Assessment: Defer to PT evaluation        Vision   Vision Assessment?: No apparent visual deficits   Perception Perception Perception: Not tested   Praxis Praxis Praxis: Not tested    Cognition Arousal: Alert Behavior During Therapy: Flat affect Overall Cognitive Status: No family/caregiver present to determine baseline cognitive functioning                                 General Comments: slowed  processing, aware of need to use the bathroom and follows commands appropriately        Exercises      Shoulder Instructions       General Comments VSS on 2L O2    Pertinent Vitals/ Pain       Pain Assessment Pain Assessment: No/denies pain  Home Living                                          Prior  Functioning/Environment              Frequency  Min 1X/week        Progress Toward Goals  OT Goals(current goals can now be found in the care plan section)  Progress towards OT goals: Progressing toward goals  Acute Rehab OT Goals Patient Stated Goal: none stated OT Goal Formulation: With patient Time For Goal Achievement: 10/27/23 Potential to Achieve Goals: Good ADL Goals Pt Will Perform Upper Body Dressing: with modified independence;sitting Pt Will Perform Lower Body Dressing: with modified independence;sitting/lateral leans;sit to/from stand Pt Will Transfer to Toilet: with modified independence;ambulating;regular height toilet Additional ADL Goal #1: pt will be able to stand x10 min for functional task in order to improve activity tolerance for ADLs  Plan      Co-evaluation                 AM-PAC OT "6 Clicks" Daily Activity     Outcome Measure   Help from another person eating meals?: None Help from another person taking care of personal grooming?: A Little Help from another person toileting, which includes using toliet, bedpan, or urinal?: A Little Help from another person bathing (including washing, rinsing, drying)?: A Lot Help from another person to put on and taking off regular upper body clothing?: A Little Help from another person to put on and taking off regular lower body clothing?: A Little 6 Click Score: 18    End of Session Equipment Utilized During Treatment: Rolling walker (2 wheels);Oxygen  OT Visit Diagnosis: Unsteadiness on feet (R26.81);Cognitive communication deficit (R41.841);Muscle weakness (generalized) (M62.81)   Activity Tolerance Patient tolerated treatment well   Patient Left in chair;with call bell/phone within reach;with chair alarm set;with nursing/sitter in room   Nurse Communication Mobility status        Time: 4401-0272 OT Time Calculation (min): 24 min  Charges: OT General Charges $OT Visit: 1 Visit OT  Treatments $Self Care/Home Management : 23-37 mins  [name], OTD, OTR/L SecureChat Preferred Acute Rehab [phone]   [name] 10/16/2023, 12:12 PM

## 2023-10-16 NOTE — Consult Note (Signed)
[facility]       Reason for Consult: liver lesion    Referring Physician: Dr. Alanda Slim  Principal Problem:   Fever and neutropenia (HCC) Active Problems:   Weakness   Thrombocytopenia (HCC)   Malignant neoplasm of right lung (HCC)   Sepsis (HCC)    arformoterol  15 mcg Nebulization BID   And   umeclidinium bromide  1 puff Inhalation Daily   atorvastatin  20 mg Oral Daily   Chlorhexidine Gluconate Cloth  6 each Topical Daily   dexamethasone  4 mg Oral Q8H   levETIRAcetam  750 mg Oral BID   metoprolol tartrate  25 mg Oral BID   montelukast  10 mg Oral QHS   multivitamin with minerals  1 tablet Oral Q breakfast   pantoprazole  40 mg Oral Daily   sertraline  50 mg Oral Daily   sodium chloride flush  10 mL Intravenous Q12H   sodium chloride flush  10-40 mL Intracatheter Q12H    Recommendations: Will start amp/sulbactam Will stop other antibiotics   Assessment: She has a liver lesion with concern for abscess and awaiting IR aspiration for confirmation to see if antibiotics indicated.  Can treat as above.    HPI: [name] is a 62 y.o. female with a history of non-small cell lung cancer with metastasis to the brain on current treatment with chemotherapy and radiation came in 11/29 with fatigue and was febrile to 100.6. She was neutropenic with an ANC of 600 and started on broad antibiotics for neutropenic fever.  MRI of the liver reveals 3 hepatic lesions as a rim enhancing area with concern for abscess.  IR planning to aspirate once platelets are good enough.  Now also with rising creat while on vancomycin.  She is otherwise afebrile and no longer neutropenic.   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    Overweight (BMI 25.0-29.9) 05/11/2016   Palpitations    Personal history of radiation therapy 2016   Sarcoidosis STABLE PER CXR JUNE 2013   Shortness of breath    Sickle cell trait (HCC)     Social History   Tobacco Use   Smoking status: Former    Current packs/day: 0.00    Average packs/day: 1 pack/day for 20.0 years (20.0 ttl pk-yrs)    Types: Cigarettes    Start date: 11/14/1983    Quit date: 11/14/2003    Years since quitting: 19.9   Smokeless tobacco: Never  Vaping Use   Vaping status: Never Used  Substance Use Topics   Alcohol use: No   Drug use: No    Family History  Problem Relation Age of Onset   Hyperlipidemia Mother    Asthma Mother    Lupus Mother    Breast cancer Mother 12   Hypertension Father    Hyperlipidemia Father    Hypertension Sister    Hypertension Brother    Cancer Neg  Hx    Diabetes Neg Hx    Coronary artery disease Neg Hx     Allergies  Allergen Reactions   Codeine Other (See Comments)    Avoids-felt bad when taken before Pt claims she can have this but does not like to take    Physical Exam: Constitutional: in no apparent distress  Vitals:   10/16/23 1415 10/16/23 1517  BP: 121/62 118/69  Pulse: 72 72  Resp: 16 18  Temp: 98.1 F (36.7 C) 98.7 F (37.1 C)  SpO2: 100% 100%   EYES: anicteric Respiratory: normal respiratory effort   Lab Results  Component Value Date   WBC 10.6 (H) 10/16/2023   HGB 7.3 (L) 10/16/2023   HCT 23.4 (L) 10/16/2023   MCV 83.6 10/16/2023   PLT 21 (LL) 10/16/2023    Lab Results  Component Value Date   CREATININE 1.28 (H) 10/16/2023   BUN 13 10/16/2023   NA 140 10/16/2023   K 3.3 (L) 10/16/2023   CL 104 10/16/2023   CO2 25 10/16/2023    Lab Results  Component  Value Date   ALT 12 10/14/2023   AST 12 (L) 10/14/2023   ALKPHOS 50 10/14/2023     Microbiology: Recent Results (from the past 240 hour(s))  Resp panel by RT-PCR (RSV, Flu A&B, Covid) Anterior Nasal Swab     Status: None   Collection Time: 10/12/23  8:29 AM   Specimen: Anterior Nasal Swab  Result Value Ref Range Status   SARS Coronavirus 2 by RT PCR NEGATIVE NEGATIVE Final   Influenza A by PCR NEGATIVE NEGATIVE Final   Influenza B by PCR NEGATIVE NEGATIVE Final    Comment: (NOTE) The Xpert Xpress SARS-CoV-2/FLU/RSV plus assay is intended as an aid in the diagnosis of influenza from Nasopharyngeal swab specimens and should not be used as a sole basis for treatment. Nasal washings and aspirates are unacceptable for Xpert Xpress SARS-CoV-2/FLU/RSV testing.  Fact Sheet for Patients: BloggerCourse.com  Fact Sheet for Healthcare Providers: SeriousBroker.it  This test is not yet approved or cleared by the [place] FDA and has been authorized for detection and/or diagnosis of SARS-CoV-2 by FDA under an Emergency Use Authorization (EUA). This EUA will remain in effect (meaning this test can be used) for the duration of the COVID-19 declaration under Section 564(b)(1) of the Act, 21 U.S.C. section 360bbb-3(b)(1), unless the authorization is terminated or revoked.     Resp Syncytial Virus by PCR NEGATIVE NEGATIVE Final    Comment: (NOTE) Fact Sheet for Patients: BloggerCourse.com  Fact Sheet for Healthcare Providers: SeriousBroker.it  This test is not yet approved or cleared by the [place] FDA and has been authorized for detection and/or diagnosis of SARS-CoV-2 by FDA under an Emergency Use Authorization (EUA). This EUA will remain in effect (meaning this test can be used) for the duration of the COVID-19 declaration under Section 564(b)(1) of the Act, 21 U.S.C. section  360bbb-3(b)(1), unless the authorization is terminated or revoked.  Performed at [facility], [address], [place], [place] [address]   Blood Culture (routine x 2)     Status: None (Preliminary result)   Collection Time: 10/12/23  8:29 AM   Specimen: BLOOD  Result Value Ref Range Status   Specimen Description BLOOD LEFT ANTECUBITAL  Final   Special Requests   Final    BOTTLES DRAWN AEROBIC AND ANAEROBIC Blood Culture results may not be optimal due to an inadequate volume of blood received in culture bottles  Culture   Final    NO GROWTH 4 DAYS Performed at [facility], [address], [place], [place] [address]    Report Status PENDING  Incomplete  Blood Culture (routine x 2)     Status: None (Preliminary result)   Collection Time: 10/12/23  8:34 AM   Specimen: BLOOD  Result Value Ref Range Status   Specimen Description BLOOD BLOOD RIGHT HAND  Final   Special Requests   Final    BOTTLES DRAWN AEROBIC AND ANAEROBIC Blood Culture adequate volume   Culture   Final    NO GROWTH 4 DAYS Performed at [facility], [address] [place], [place] [address]    Report Status PENDING  Incomplete  C Difficile Quick Screen w PCR reflex     Status: None   Collection Time: 10/12/23  5:57 PM   Specimen: STOOL  Result Value Ref Range Status   C Diff antigen NEGATIVE NEGATIVE Final   C Diff toxin NEGATIVE NEGATIVE Final   C Diff interpretation No C. difficile detected.  Final    Comment: Performed at [facility], [address], [place], [place] [address]    Gardiner Barefoot, MD [email or web address] 10/16/2023, 3:39 PM

## 2023-10-17 ENCOUNTER — Ambulatory Visit: Payer: 59 | Admitting: Internal Medicine

## 2023-10-17 ENCOUNTER — Inpatient Hospital Stay (HOSPITAL_COMMUNITY): Payer: 59

## 2023-10-17 ENCOUNTER — Ambulatory Visit: Payer: 59

## 2023-10-17 ENCOUNTER — Other Ambulatory Visit: Payer: 59

## 2023-10-17 DIAGNOSIS — R5081 Fever presenting with conditions classified elsewhere: Secondary | ICD-10-CM | POA: Diagnosis not present

## 2023-10-17 DIAGNOSIS — D709 Neutropenia, unspecified: Secondary | ICD-10-CM | POA: Diagnosis not present

## 2023-10-17 LAB — BPAM PLATELET PHERESIS
Blood Product Expiration Date: 202412042359
Blood Product Expiration Date: 202412042359
ISSUE DATE / TIME: 202412031216
ISSUE DATE / TIME: 202412031340
ISSUE DATE / TIME: 202412031354
ISSUE DATE / TIME: 202412042359
Unit Type and Rh: 202412042359
Unit Type and Rh: 202412042359
Unit Type and Rh: 5100
Unit Type and Rh: 600
Unit Type and Rh: 6200

## 2023-10-17 LAB — PREPARE PLATELET PHERESIS
Unit division: 0
Unit division: 0
Unit division: 0

## 2023-10-17 LAB — CBC
HCT: 20.2 % — ABNORMAL LOW (ref 36.0–46.0)
Hemoglobin: 6.4 g/dL — CL (ref 12.0–15.0)
MCH: 26.3 pg (ref 26.0–34.0)
MCHC: 31.7 g/dL (ref 30.0–36.0)
MCV: 83.1 fL (ref 80.0–100.0)
Platelets: 52 10*3/uL — ABNORMAL LOW (ref 150–400)
RBC: 2.43 MIL/uL — ABNORMAL LOW (ref 3.87–5.11)
RDW: 17.2 % — ABNORMAL HIGH (ref 11.5–15.5)
WBC: 16.4 10*3/uL — ABNORMAL HIGH (ref 4.0–10.5)
nRBC: 0.2 % (ref 0.0–0.2)

## 2023-10-17 LAB — PROTIME-INR
INR: 1.6 — ABNORMAL HIGH (ref 0.8–1.2)
Prothrombin Time: 19.6 s — ABNORMAL HIGH (ref 11.4–15.2)

## 2023-10-17 LAB — COMPREHENSIVE METABOLIC PANEL
ALT: 16 U/L (ref 0–44)
AST: 18 U/L (ref 15–41)
Albumin: 2.2 g/dL — ABNORMAL LOW (ref 3.5–5.0)
Alkaline Phosphatase: 58 U/L (ref 38–126)
Anion gap: 6 (ref 5–15)
BUN: 11 mg/dL (ref 8–23)
CO2: 26 mmol/L (ref 22–32)
Calcium: 8.1 mg/dL — ABNORMAL LOW (ref 8.9–10.3)
Chloride: 111 mmol/L (ref 98–111)
Creatinine, Ser: 0.91 mg/dL (ref 0.44–1.00)
GFR, Estimated: >60 mL/min (ref >60)
Glucose, Bld: 112 mg/dL — ABNORMAL HIGH (ref 70–99)
Potassium: 3.5 mmol/L (ref 3.5–5.1)
Sodium: 143 mmol/L (ref 135–145)
Total Bilirubin: 0.7 mg/dL (ref <1.2)
Total Protein: 4.7 g/dL — ABNORMAL LOW (ref 6.5–8.1)

## 2023-10-17 LAB — MAGNESIUM: Magnesium: 1.5 mg/dL — ABNORMAL LOW (ref 1.7–2.4)

## 2023-10-17 LAB — CULTURE, BLOOD (ROUTINE X 2)
Culture: NO GROWTH
Culture: NO GROWTH
Special Requests: ADEQUATE

## 2023-10-17 LAB — PREPARE RBC (CROSSMATCH)

## 2023-10-17 LAB — HEMOGLOBIN AND HEMATOCRIT, BLOOD
HCT: 24.7 % — ABNORMAL LOW (ref 36.0–46.0)
Hemoglobin: 8 g/dL — ABNORMAL LOW (ref 12.0–15.0)

## 2023-10-17 MED ORDER — MIDAZOLAM HCL 2 MG/2ML IJ SOLN
INTRAMUSCULAR | Status: AC | PRN
  Administered 2023-10-17 (×3): .5 mg via INTRAVENOUS

## 2023-10-17 MED ORDER — MAGNESIUM SULFATE 4 GM/100ML IV SOLN
4.0000 g | Freq: Once | INTRAVENOUS | Status: AC
  Administered 2023-10-17: 4 g via INTRAVENOUS
  Filled 2023-10-17: qty 100

## 2023-10-17 MED ORDER — FENTANYL CITRATE (PF) 100 MCG/2ML IJ SOLN
INTRAMUSCULAR | Status: AC | PRN
  Administered 2023-10-17 (×2): 25 ug via INTRAVENOUS

## 2023-10-17 MED ORDER — FENTANYL CITRATE (PF) 100 MCG/2ML IJ SOLN
INTRAMUSCULAR | Status: AC
  Filled 2023-10-17: qty 4

## 2023-10-17 MED ORDER — LIDOCAINE HCL (PF) 1 % IJ SOLN
10.0000 mL | Freq: Once | INTRAMUSCULAR | Status: AC
  Administered 2023-10-17: 10 mL via INTRADERMAL

## 2023-10-17 MED ORDER — SODIUM CHLORIDE 0.9% IV SOLUTION: Freq: Once | INTRAVENOUS | Status: AC

## 2023-10-17 MED ORDER — MIDAZOLAM HCL 2 MG/2ML IJ SOLN
INTRAMUSCULAR | Status: AC
Start: 2023-10-17 — End: ?
  Filled 2023-10-17: qty 4

## 2023-10-17 NOTE — Plan of Care (Signed)

## 2023-10-17 NOTE — Progress Notes (Signed)
Pt pulled her PIV out of right forearm w/ cath intact.  Pt stated she must have done it while asleep again.  Port a cath remains accessed.

## 2023-10-17 NOTE — TOC Progression Note (Signed)
Transition of Care [facility]) - Progression Note    Patient Details  Name: [name] MRN: [id number] Date of Birth: [birth year]  [facility] of [facility]) CM/SW Contact  Janae Bridgeman, RN Phone Number: 10/17/2023, 4:20 PM  Clinical Narrative:    Jacklynn Bue with Care Management with [facility] called [phone] and will follow up with patient in the community for care management support.        Expected Discharge Plan and Services                                               Social Determinants of Health (SDOH) Interventions SDOH Screenings   Food Insecurity: No Food Insecurity (10/12/2023)  Housing: Patient Declined (10/12/2023)  Transportation Needs: No Transportation Needs (10/12/2023)  Utilities: Not At Risk (10/12/2023)  Alcohol Screen: Low Risk  (07/23/2023)  Depression (PHQ2-9): Low Risk  (07/23/2023)  Tobacco Use: Medium Risk (10/12/2023)    Readmission Risk Interventions    09/18/2023    1:52 PM  Readmission Risk Prevention Plan  Transportation Screening Complete  Medication Review (RN Care Manager) Referral to Pharmacy  PCP or Specialist appointment within 3-5 days of discharge Complete  HRI or Home Care Consult Complete  SW Recovery Care/Counseling Consult Complete  Palliative Care Screening Complete  Skilled Nursing Facility Not Applicable

## 2023-10-17 NOTE — Procedures (Signed)
Interventional Radiology Procedure Note  Procedure: US guided biopsy of liver lesion Complications: None Findings: Aspiration yielded no fluid.  Mx core biopsy.  EBL: None Recommendations: - Bedrest 2 hours, then ambulate per primary order.   - Routine wound care - Follow up pathology - Advance diet ok per primary order  Signed,  Gilmer Mor, DO

## 2023-10-17 NOTE — Progress Notes (Signed)
PT Cancellation Note  Patient Details Name: [name] MRN: [id number] DOB: [birth year]   Cancelled Treatment:    Reason Eval/Treat Not Completed: (P) Patient at procedure or test/unavailable (Transport arriving to take pt to ultrasound. Will follow up as able.)   Johny Shock 10/17/2023, 1:50 PM

## 2023-10-17 NOTE — Progress Notes (Signed)
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  She was admitted with working diagnosis of neutropenic fever and acute thrombocytopenia on broad-spectrum antibiotics.  Last chemotherapy was 10/06/2023.  In the emergency room temperature was 100.6, tachycardic with heart rate 136.  WBC 0.9 with ANC of 0.6.  Platelet 21 with normal baseline platelets.  COVID-19 influenza and RSV negative.  UA and chest x-ray negative.  Magnesium was 1.  She was admitted with broad-spectrum antibiotics with working diagnosis of neutropenic fever and diarrhea.  Remains in the hospital on antibiotics.  Subjective: Patient seen in the morning rounds.  Denied any complaints.  She was given 1 unit of PRBC early morning, repeat hemoglobin is 8.  Going to IR for liver biopsy. Assessment & Plan:   Neutropenic fever: Likely due to recent chemotherapy.  Presented with evidence of sepsis with fever, tachycardia and neutropenia.  CT scan abdomen pelvis concerning for possible hepatic and splenic abscess or colitis.  MRI abdomen concern for possible liver abscess or liver lesion.  Leukopenia and neutropenia has resolved.  She is now with leukocytosis. Patient on cefepime vancomycin Flagyl-changed to Unasyn on 12/3 by ID. IR consulted for liver biopsy, to be performed today. Oncology and ID following.  Pancytopenia, acute blood loss anemia: Patient received 2 units of platelets, platelets 52 today. Hemoglobin 6.4-1 unit of PRBC-hemoglobin 8 today.  Will recheck tomorrow morning.  Stage IV non-small cell lung cancer with brain and osseous mets: On radiation and  chemotherapy.  Last chemo 11/23.  Seen by oncology in the hospital.  Currently on Decadron 4 mg 3 times daily.  Diarrhea and colitis: Likely due to chemo.  C. difficile negative.  AKI: Creatinine back to baseline.  Hypomagnesemia: Severe and persistently low.  4 g mag rider today.  Potassium is replaced.  COPD, sarcoidosis, hypoxemia: On 2 L oxygen.  On Singulair and breathing treatments.     DVT prophylaxis: SCDs Start: 10/12/23 1436   Code Status: DNR/DNI Family Communication: None at the bedside Disposition Plan: Status is: Inpatient Remains inpatient appropriate because: Inpatient procedures planned     Consultants:  Hematology oncology Infectious disease IR  Procedures:  Plan for liver biopsy today  Antimicrobials:  Vancomycin, cefepime 11/29--12/3 Unasyn 12/3----     Objective: Vitals:   10/17/23 0513 10/17/23 0718 10/17/23 0815 10/17/23 0816  BP: (!) 155/56 (!) 141/72 (!) 156/72   Pulse: 73 (!) 59 (!) 53   Resp: 16 16 18    Temp: 98.5 F (36.9 C) 98.8 F (37.1 C) 98.2 F (36.8 C)   TempSrc: Oral Oral Oral   SpO2: 100% 100% 100% 100%  Weight:      Height:        Intake/Output Summary (Last 24 hours) at 10/17/2023 1325 Last data filed at 10/17/2023 0815 Gross per 24 hour  Intake 1427.44 ml  Output --  Net 1427.44 ml   Filed Weights   10/12/23 1500 10/13/23 0415  Weight: 72.4 kg 72.1 kg    Examination:  General exam: Chronically sick looking.  Not in any distress.  On room air today.  Older than his stated age. Respiratory system: Clear to  auscultation. Respiratory effort normal.  No added sounds. Cardiovascular system: S1 & S2 heard, RRR.  No edema. Gastrointestinal system: Soft.  Nontender.  Bowel sound present. Central nervous system: Alert and oriented. No focal neurological deficits. Extremities: Symmetric 5 x 5 power.  Generalized weakness.    Data Reviewed: I have personally reviewed following labs and imaging  studies  CBC: Recent Labs  Lab 10/12/23 0845 10/13/23 0443 10/14/23 0940 10/15/23 0744 10/16/23 0600 10/17/23 0404 10/17/23 1027  WBC 0.9* 1.9* 4.0 6.3 10.6* 16.4*  --   NEUTROABS 0.6* 1.6*  --   --   --   --   --   HGB 8.5* 7.2* 7.9* 7.6* 7.3* 6.4* 8.0*  HCT 27.2* 23.3* 25.2* 24.9* 23.4* 20.2* 24.7*  MCV 85.3 84.1 84.6 83.6 83.6 83.1  --   PLT 21* 14* 23* 22* 21* 52*  --    Basic Metabolic Panel: Recent Labs  Lab 10/13/23 0443 10/14/23 0817 10/15/23 0744 10/16/23 0600 10/17/23 0404  NA 142 140 139 140 143  K 2.7* 4.5 4.0 3.3* 3.5  CL 103 104 105 104 111  CO2 30 25 26 25 26   GLUCOSE 77 144* 130* 100* 112*  BUN 11 13 12 13 11   CREATININE 0.72 1.12* 0.92 1.28* 0.91  CALCIUM 7.1* 7.8* 8.2* 8.2* 8.1*  MG 1.6* 1.7 1.6* 1.8 1.5*  PHOS 2.5  --  1.8* 2.9  --    GFR: Estimated Creatinine Clearance: 62.9 mL/min (by C-G formula based on SCr of 0.91 mg/dL). Liver Function Tests: Recent Labs  Lab 10/12/23 0845 10/13/23 0443 10/14/23 0817 10/15/23 0744 10/16/23 0600 10/17/23 0404  AST 21 15 12*  --   --  18  ALT 13 14 12   --   --  16  ALKPHOS 47 43 50  --   --  58  BILITOT 1.4* 0.8 0.8  --   --  0.7  PROT 5.1* 4.8* 5.2*  --   --  4.7*  ALBUMIN 2.5* 2.2* 2.3* 2.4* 2.3* 2.2*   No results for input(s): "LIPASE", "AMYLASE" in the last 168 hours. No results for input(s): "AMMONIA" in the last 168 hours. Coagulation Profile: Recent Labs  Lab 10/12/23 0845 10/15/23 1516 10/16/23 0600 10/17/23 0404  INR 1.6* 1.6* 1.6* 1.6*   Cardiac Enzymes: No results for input(s): "CKTOTAL", "CKMB", "CKMBINDEX", "TROPONINI" in the last 168 hours. BNP (last 3 results) No results for input(s): "PROBNP" in the last 8760 hours. HbA1C: No results for input(s): "HGBA1C" in the last 72 hours. CBG: No results for input(s): "GLUCAP" in the last 168 hours. Lipid Profile: No results for input(s): "CHOL", "HDL", "LDLCALC", "TRIG", "CHOLHDL", "LDLDIRECT" in the last 72 hours. Thyroid  Function Tests: No results for input(s): "TSH", "T4TOTAL", "FREET4", "T3FREE", "THYROIDAB" in the last 72 hours. Anemia Panel: No results for input(s): "VITAMINB12", "FOLATE", "FERRITIN", "TIBC", "IRON", "RETICCTPCT" in the last 72 hours. Sepsis Labs: Recent Labs  Lab 10/12/23 0906  LATICACIDVEN 1.2    Recent Results (from the past 240 hour(s))  Resp panel by RT-PCR (RSV, Flu A&B, Covid) Anterior Nasal Swab     Status: None   Collection Time: 10/12/23  8:29 AM   Specimen: Anterior Nasal Swab  Result Value Ref Range Status   SARS Coronavirus 2 by RT PCR NEGATIVE NEGATIVE Final   Influenza A by PCR NEGATIVE NEGATIVE Final   Influenza B by PCR NEGATIVE NEGATIVE Final    Comment: (NOTE) The Xpert Xpress SARS-CoV-2/FLU/RSV plus assay is intended as an aid  in the diagnosis of influenza from Nasopharyngeal swab specimens and should not be used as a sole basis for treatment. Nasal washings and aspirates are unacceptable for Xpert Xpress SARS-CoV-2/FLU/RSV testing.  Fact Sheet for Patients: BloggerCourse.com  Fact Sheet for Healthcare Providers: SeriousBroker.it  This test is not yet approved or cleared by the [place] FDA and has been authorized for detection and/or diagnosis of SARS-CoV-2 by FDA under an Emergency Use Authorization (EUA). This EUA will remain in effect (meaning this test can be used) for the duration of the COVID-19 declaration under Section 564(b)(1) of the Act, 21 U.S.C. section 360bbb-3(b)(1), unless the authorization is terminated or revoked.     Resp Syncytial Virus by PCR NEGATIVE NEGATIVE Final    Comment: (NOTE) Fact Sheet for Patients: BloggerCourse.com  Fact Sheet for Healthcare Providers: SeriousBroker.it  This test is not yet approved or cleared by the [place] FDA and has been authorized for detection and/or diagnosis of SARS-CoV-2  by FDA under an Emergency Use Authorization (EUA). This EUA will remain in effect (meaning this test can be used) for the duration of the COVID-19 declaration under Section 564(b)(1) of the Act, 21 U.S.C. section 360bbb-3(b)(1), unless the authorization is terminated or revoked.  Performed at [facility], [address] [place], [place] [address]   Blood Culture (routine x 2)     Status: None   Collection Time: 10/12/23  8:29 AM   Specimen: BLOOD  Result Value Ref Range Status   Specimen Description BLOOD LEFT ANTECUBITAL  Final   Special Requests   Final    BOTTLES DRAWN AEROBIC AND ANAEROBIC Blood Culture results may not be optimal due to an inadequate volume of blood received in culture bottles   Culture   Final    NO GROWTH 5 DAYS Performed at [facility], [address] [place], [place] [address]    Report Status 10/17/2023 FINAL  Final  Blood Culture (routine x 2)     Status: None   Collection Time: 10/12/23  8:34 AM   Specimen: BLOOD  Result Value Ref Range Status   Specimen Description BLOOD BLOOD RIGHT HAND  Final   Special Requests   Final    BOTTLES DRAWN AEROBIC AND ANAEROBIC Blood Culture adequate volume   Culture   Final    NO GROWTH 5 DAYS Performed at [facility], [address], [place], [place] [address]    Report Status 10/17/2023 FINAL  Final  C Difficile Quick Screen w PCR reflex     Status: None   Collection Time: 10/12/23  5:57 PM   Specimen: STOOL  Result Value Ref Range Status   C Diff antigen NEGATIVE NEGATIVE Final   C Diff toxin NEGATIVE NEGATIVE Final   C Diff interpretation No C. difficile detected.  Final    Comment: Performed at [facility], [address] [place], [place] [address]         Radiology Studies: No results found.      Scheduled Meds:  arformoterol  15 mcg Nebulization BID   And   umeclidinium bromide  1 puff Inhalation Daily   atorvastatin  20 mg Oral Daily   Chlorhexidine  Gluconate Cloth  6 each Topical Daily   dexamethasone  4 mg Oral Q8H   levETIRAcetam  750 mg Oral BID   metoprolol tartrate  25 mg Oral BID   montelukast  10 mg Oral QHS   multivitamin with minerals  1  tablet Oral Q breakfast   pantoprazole  40 mg Oral Daily   sertraline  50 mg Oral Daily   sodium chloride flush  10 mL Intravenous Q12H   sodium chloride flush  10-40 mL Intracatheter Q12H   Continuous Infusions:  ampicillin-sulbactam (UNASYN) IV 3 g (10/17/23 1205)   magnesium sulfate bolus IVPB       LOS: 5 days    Time spent: 35 minutes    Dorcas Carrow, MD [facility]

## 2023-10-17 NOTE — Progress Notes (Signed)
  [id number] Liaison Note:  This patient is currently enrolled in [facility] outpatient-based palliative care.   Please call for any outpatient based palliative care related questions or concerns.  Thank you, Glenna Fellows, BSN, RN, OCN [facility] Liaison [phone]

## 2023-10-17 NOTE — Progress Notes (Signed)
[facility] night cross cover note:   I was notified by RN of the patient's updated hemoglobin this morning of 6.4, down slightly from yesterday's value of 7.3.  It appears that she has undergone platelet pheresis during this hospitalization, but has not yet required PRBC transfusion.  This morning CBC is also notable for platelet count of 52, up from yesterday's value of 21.  Heart rates in the 60s to 70s.  Most recent documented blood pressure 133/67.  She has an active type and screen checked within the last 24 hours.  I have subsequently ordered transfusion of 1 unit PRBC over 3 hours, with order for repeat CBC to be checked following completion of transfusion of this 1 unit PRBC.     Newton Pigg, DO Hospitalist

## 2023-10-18 ENCOUNTER — Inpatient Hospital Stay (HOSPITAL_COMMUNITY): Payer: 59

## 2023-10-18 DIAGNOSIS — R5081 Fever presenting with conditions classified elsewhere: Secondary | ICD-10-CM | POA: Diagnosis not present

## 2023-10-18 DIAGNOSIS — D709 Neutropenia, unspecified: Secondary | ICD-10-CM | POA: Diagnosis not present

## 2023-10-18 LAB — COMPREHENSIVE METABOLIC PANEL
ALT: 20 U/L (ref 0–44)
AST: 25 U/L (ref 15–41)
Albumin: 2.2 g/dL — ABNORMAL LOW (ref 3.5–5.0)
Alkaline Phosphatase: 62 U/L (ref 38–126)
Anion gap: 10 (ref 5–15)
BUN: 9 mg/dL (ref 8–23)
CO2: 26 mmol/L (ref 22–32)
Calcium: 8.4 mg/dL — ABNORMAL LOW (ref 8.9–10.3)
Chloride: 107 mmol/L (ref 98–111)
Creatinine, Ser: 0.8 mg/dL (ref 0.44–1.00)
GFR, Estimated: >60 mL/min (ref >60)
Glucose, Bld: 124 mg/dL — ABNORMAL HIGH (ref 70–99)
Potassium: 3.3 mmol/L — ABNORMAL LOW (ref 3.5–5.1)
Sodium: 143 mmol/L (ref 135–145)
Total Bilirubin: 0.7 mg/dL (ref <1.2)
Total Protein: 4.7 g/dL — ABNORMAL LOW (ref 6.5–8.1)

## 2023-10-18 LAB — TYPE AND SCREEN
ABO/RH(D): O POS
Antibody Screen: NEGATIVE
Unit division: 0

## 2023-10-18 LAB — CBC WITH DIFFERENTIAL/PLATELET
Abs Immature Granulocytes: 3.12 10*3/uL — ABNORMAL HIGH (ref 0.00–0.07)
Basophils Absolute: 0.2 10*3/uL — ABNORMAL HIGH (ref 0.0–0.1)
Basophils Relative: 1 %
Eosinophils Absolute: 0 10*3/uL (ref 0.0–0.5)
Eosinophils Relative: 0 %
HCT: 23.7 % — ABNORMAL LOW (ref 36.0–46.0)
Hemoglobin: 7.7 g/dL — ABNORMAL LOW (ref 12.0–15.0)
Immature Granulocytes: 15 %
Lymphocytes Relative: 2 %
Lymphs Abs: 0.5 10*3/uL — ABNORMAL LOW (ref 0.7–4.0)
MCH: 27.3 pg (ref 26.0–34.0)
MCHC: 32.5 g/dL (ref 30.0–36.0)
MCV: 84 fL (ref 80.0–100.0)
Monocytes Absolute: 1.3 10*3/uL — ABNORMAL HIGH (ref 0.1–1.0)
Monocytes Relative: 6 %
Neutro Abs: 15.5 10*3/uL — ABNORMAL HIGH (ref 1.7–7.7)
Neutrophils Relative %: 76 %
Platelets: 45 10*3/uL — ABNORMAL LOW (ref 150–400)
RBC: 2.82 MIL/uL — ABNORMAL LOW (ref 3.87–5.11)
RDW: 16.8 % — ABNORMAL HIGH (ref 11.5–15.5)
Smear Review: DECREASED
WBC: 20.6 10*3/uL — ABNORMAL HIGH (ref 4.0–10.5)
nRBC: 0.4 % — ABNORMAL HIGH (ref 0.0–0.2)

## 2023-10-18 LAB — BPAM RBC
Blood Product Expiration Date: 202412182359
ISSUE DATE / TIME: 202412040452
Unit Type and Rh: 5100

## 2023-10-18 LAB — MAGNESIUM: Magnesium: 2.2 mg/dL (ref 1.7–2.4)

## 2023-10-18 MED ORDER — DEXAMETHASONE 4 MG PO TABS
4.0000 mg | ORAL_TABLET | Freq: Two times a day (BID) | ORAL | Status: DC
  Administered 2023-10-18 – 2023-10-19 (×2): 4 mg via ORAL
  Filled 2023-10-18 (×2): qty 1

## 2023-10-18 MED ORDER — POTASSIUM CHLORIDE CRYS ER 20 MEQ PO TBCR
40.0000 meq | EXTENDED_RELEASE_TABLET | Freq: Two times a day (BID) | ORAL | Status: DC
  Administered 2023-10-18 – 2023-10-19 (×3): 40 meq via ORAL
  Filled 2023-10-18 (×3): qty 2

## 2023-10-18 NOTE — Progress Notes (Signed)
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  She was admitted with working diagnosis of neutropenic fever and acute thrombocytopenia on broad-spectrum antibiotics.  Last chemotherapy was 10/06/2023.  In the emergency room temperature was 100.6, tachycardic with heart rate 136.  WBC 0.9 with ANC of 0.6.  Platelet 21 with normal baseline platelets.  COVID-19 influenza and RSV negative.  UA and chest x-ray negative.  Magnesium was 1.  She was admitted with broad-spectrum antibiotics with working diagnosis of neutropenic fever and diarrhea.  Remains in the hospital on antibiotics. Patient is clinically improving.  Subjective: Patient seen and examined.  Her sister and daughter at the bedside.  Asymptomatic last 24 hours.  She denies any complaints.  Eating regular diet.  Remains afebrile. Biopsy did not yield any abscess. Plan to discontinue antibiotics and monitor, anticipate home tomorrow.   Assessment & Plan:   Neutropenic fever: Likely due to recent chemotherapy.  Presented with evidence of sepsis with fever, tachycardia and neutropenia.  CT scan abdomen pelvis concerning for possible hepatic and splenic abscess or colitis.  MRI abdomen concern for possible liver abscess or liver lesion.  Leukopenia and neutropenia has resolved.  She is now with leukocytosis. Patient on cefepime vancomycin Flagyl-changed to Unasyn on 12/3 by ID. ID recommended to discontinue antibiotics today and monitor off antibiotics. Underwent biopsy of the liver lesion 12/4, no abscess found.  Tissue biopsy pending.  Pancytopenia, acute blood loss anemia:  Patient received 2 units of platelets, platelets 5 today.. Hemoglobin 6.4-1 unit of PRBC-hemoglobin stable.  Recheck tomorrow morning.  Stage IV non-small cell lung cancer with brain and osseous mets: On radiation and chemotherapy.  Last chemo 11/23.  Seen by oncology in the hospital.  Currently on Decadron 4 mg 3 times daily. Will taper Decadron 4 mg twice daily and discharged on this doses.  Diarrhea and colitis: Likely due to chemo.  C. difficile negative.  AKI: Creatinine back to baseline.  Hypomagnesemia: Severe and persistently low.  Adequate today.  Replace potassium.  COPD, sarcoidosis, hypoxemia: On 2 L oxygen.  On Singulair and breathing treatments.  Continue to mobilize.  Discharge with family anticipate tomorrow.   DVT prophylaxis: SCDs Start: 10/12/23 1436   Code Status: DNR/DNI Family Communication: Daughter and sister at the bedside. Disposition Plan: Status is: Inpatient Remains inpatient appropriate because: IV antibiotics.     Consultants:  Hematology oncology Infectious disease IR  Procedures:  Plan for liver biopsy  Antimicrobials:  Vancomycin, cefepime 11/29--12/3 Unasyn 12/3----12/5     Objective: Vitals:   10/18/23 0450 10/18/23 0746 10/18/23 0819 10/18/23 0823  BP: 125/71 (!) 149/75    Pulse: 71 81    Resp: 18 16    Temp: 97.6 F (36.4 C)     TempSrc: Axillary     SpO2: 90% 91% 90% 90%  Weight:      Height:        Intake/Output Summary (Last 24 hours) at 10/18/2023 1158 Last data filed at 10/18/2023 0400 Gross per 24 hour  Intake 200 ml  Output --  Net 200 ml   Filed Weights   10/12/23 1500 10/13/23 0415  Weight:  72.4 kg 72.1 kg    Examination:  General exam: Chronically sick looking.  Pleasant to interaction.  On 2 L oxygen. Respiratory system: Clear to auscultation. Respiratory effort normal.  No added sounds. Cardiovascular system: S1 & S2 heard, RRR.  No edema. Gastrointestinal system: Soft.  Nontender.  Bowel sound  present. Central nervous system: Alert and oriented. No focal neurological deficits. Extremities: Symmetric 5 x 5 power.  Generalized weakness.    Data Reviewed: I have personally reviewed following labs and imaging studies  CBC: Recent Labs  Lab 10/12/23 0845 10/13/23 0443 10/14/23 0940 10/15/23 0744 10/16/23 0600 10/17/23 0404 10/17/23 1027 10/18/23 0219  WBC 0.9* 1.9* 4.0 6.3 10.6* 16.4*  --  20.6*  NEUTROABS 0.6* 1.6*  --   --   --   --   --  15.5*  HGB 8.5* 7.2* 7.9* 7.6* 7.3* 6.4* 8.0* 7.7*  HCT 27.2* 23.3* 25.2* 24.9* 23.4* 20.2* 24.7* 23.7*  MCV 85.3 84.1 84.6 83.6 83.6 83.1  --  84.0  PLT 21* 14* 23* 22* 21* 52*  --  45*   Basic Metabolic Panel: Recent Labs  Lab 10/13/23 0443 10/14/23 0817 10/15/23 0744 10/16/23 0600 10/17/23 0404 10/18/23 0219  NA 142 140 139 140 143 143  K 2.7* 4.5 4.0 3.3* 3.5 3.3*  CL 103 104 105 104 111 107  CO2 30 25 26 25 26 26   GLUCOSE 77 144* 130* 100* 112* 124*  BUN 11 13 12 13 11 9   CREATININE 0.72 1.12* 0.92 1.28* 0.91 0.80  CALCIUM 7.1* 7.8* 8.2* 8.2* 8.1* 8.4*  MG 1.6* 1.7 1.6* 1.8 1.5* 2.2  PHOS 2.5  --  1.8* 2.9  --   --    GFR: Estimated Creatinine Clearance: 71.6 mL/min (by C-G formula based on SCr of 0.8 mg/dL). Liver Function Tests: Recent Labs  Lab 10/12/23 0845 10/13/23 0443 10/14/23 0817 10/15/23 0744 10/16/23 0600 10/17/23 0404 10/18/23 0219  AST 21 15 12*  --   --  18 25  ALT 13 14 12   --   --  16 20  ALKPHOS 47 43 50  --   --  58 62  BILITOT 1.4* 0.8 0.8  --   --  0.7 0.7  PROT 5.1* 4.8* 5.2*  --   --  4.7* 4.7*  ALBUMIN 2.5* 2.2* 2.3* 2.4* 2.3* 2.2* 2.2*   No results for input(s): "LIPASE", "AMYLASE" in the last 168 hours. No results for input(s): "AMMONIA" in the last 168 hours. Coagulation Profile: Recent Labs  Lab 10/12/23 0845 10/15/23 1516 10/16/23 0600 10/17/23 0404  INR 1.6* 1.6* 1.6* 1.6*   Cardiac Enzymes: No results for input(s): "CKTOTAL", "CKMB", "CKMBINDEX", "TROPONINI" in  the last 168 hours. BNP (last 3 results) No results for input(s): "PROBNP" in the last 8760 hours. HbA1C: No results for input(s): "HGBA1C" in the last 72 hours. CBG: No results for input(s): "GLUCAP" in the last 168 hours. Lipid Profile: No results for input(s): "CHOL", "HDL", "LDLCALC", "TRIG", "CHOLHDL", "LDLDIRECT" in the last 72 hours. Thyroid Function Tests: No results for input(s): "TSH", "T4TOTAL", "FREET4", "T3FREE", "THYROIDAB" in the last 72 hours. Anemia Panel: No results for input(s): "VITAMINB12", "FOLATE", "FERRITIN", "TIBC", "IRON", "RETICCTPCT" in the last 72 hours. Sepsis Labs: Recent Labs  Lab 10/12/23 0906  LATICACIDVEN 1.2    Recent Results (from the past 240 hour(s))  Resp panel by RT-PCR (RSV, Flu A&B, Covid) Anterior Nasal Swab     Status: None   Collection Time: 10/12/23  8:29 AM  Specimen: Anterior Nasal Swab  Result Value Ref Range Status   SARS Coronavirus 2 by RT PCR NEGATIVE NEGATIVE Final   Influenza A by PCR NEGATIVE NEGATIVE Final   Influenza B by PCR NEGATIVE NEGATIVE Final    Comment: (NOTE) The Xpert Xpress SARS-CoV-2/FLU/RSV plus assay is intended as an aid in the diagnosis of influenza from Nasopharyngeal swab specimens and should not be used as a sole basis for treatment. Nasal washings and aspirates are unacceptable for Xpert Xpress SARS-CoV-2/FLU/RSV testing.  Fact Sheet for Patients: BloggerCourse.com  Fact Sheet for Healthcare Providers: SeriousBroker.it  This test is not yet approved or cleared by the [place] FDA and has been authorized for detection and/or diagnosis of SARS-CoV-2 by FDA under an Emergency Use Authorization (EUA). This EUA will remain in effect (meaning this test can be used) for the duration of the COVID-19 declaration under Section 564(b)(1) of the Act, 21 U.S.C. section 360bbb-3(b)(1), unless the authorization is terminated or revoked.     Resp  Syncytial Virus by PCR NEGATIVE NEGATIVE Final    Comment: (NOTE) Fact Sheet for Patients: BloggerCourse.com  Fact Sheet for Healthcare Providers: SeriousBroker.it  This test is not yet approved or cleared by the [place] FDA and has been authorized for detection and/or diagnosis of SARS-CoV-2 by FDA under an Emergency Use Authorization (EUA). This EUA will remain in effect (meaning this test can be used) for the duration of the COVID-19 declaration under Section 564(b)(1) of the Act, 21 U.S.C. section 360bbb-3(b)(1), unless the authorization is terminated or revoked.  Performed at [facility], [address] [place], [place] [address]   Blood Culture (routine x 2)     Status: None   Collection Time: 10/12/23  8:29 AM   Specimen: BLOOD  Result Value Ref Range Status   Specimen Description BLOOD LEFT ANTECUBITAL  Final   Special Requests   Final    BOTTLES DRAWN AEROBIC AND ANAEROBIC Blood Culture results may not be optimal due to an inadequate volume of blood received in culture bottles   Culture   Final    NO GROWTH 5 DAYS Performed at [facility], [address] [place], [place] [address]    Report Status 10/17/2023 FINAL  Final  Blood Culture (routine x 2)     Status: None   Collection Time: 10/12/23  8:34 AM   Specimen: BLOOD  Result Value Ref Range Status   Specimen Description BLOOD BLOOD RIGHT HAND  Final   Special Requests   Final    BOTTLES DRAWN AEROBIC AND ANAEROBIC Blood Culture adequate volume   Culture   Final    NO GROWTH 5 DAYS Performed at [facility], [address], [place], [place] [address]    Report Status 10/17/2023 FINAL  Final  C Difficile Quick Screen w PCR reflex     Status: None   Collection Time: 10/12/23  5:57 PM   Specimen: STOOL  Result Value Ref Range Status   C Diff antigen NEGATIVE NEGATIVE Final   C Diff toxin NEGATIVE NEGATIVE Final   C Diff interpretation  No C. difficile detected.  Final    Comment: Performed at [facility], [address] [place], [place] [address]         Radiology Studies: US BIOPSY (LIVER)  Result Date: 10/18/2023 INDICATION: 62 year old female with a history liver lesion, possible tumor versus abscess EXAM: ULTRASOUND-GUIDED LIVER LESION BIOPSY MEDICATIONS: None. ANESTHESIA/SEDATION: Moderate (conscious) sedation was employed  during this procedure. A total of Versed 1.5 mg and Fentanyl 50 mcg was administered intravenously by the radiology nurse. Total intra-service moderate Sedation Time: 18 minutes. The patient's level of consciousness and vital signs were monitored continuously by radiology nursing throughout the procedure under my direct supervision. COMPLICATIONS: NONE PROCEDURE: Informed written consent was obtained from the patient after a thorough discussion of the procedural risks, benefits and alternatives. All questions were addressed. Maximal Sterile Barrier Technique was utilized including caps, mask, sterile gowns, sterile gloves, sterile drape, hand hygiene and skin antiseptic. A timeout was performed prior to the initiation of the procedure. Ultrasound survey of the right liver lobe performed with images stored and sent to PACs. The lesion in segment 6 is hypoechoic and quite small. The only way to visualize the lesion for a possible biopsy was from a medial to lateral approach from the anterior. The right lower thorax/right upper abdomen was prepped with chlorhexidine in a sterile fashion, and a sterile drape was applied covering the operative field. A sterile gown and sterile gloves were used for the procedure. Local anesthesia was provided with 1% Lidocaine. The patient was prepped and draped sterilely and the skin and subcutaneous tissues were generously infiltrated with 1% lidocaine. A 17 gauge introducer needle was then advanced under ultrasound guidance in a subcostal location into the right liver lobe.  The stylet was removed, and aspiration was attempted. No aspirate returned. Multiple separate 18 gauge core biopsy were then retrieved. Samples were placed into formalin for transportation to the lab. The needle was removed, and a final ultrasound image was performed. The patient tolerated the procedure well and remained hemodynamically stable throughout. No complications were encountered and no significant blood loss was encounter. IMPRESSION: Status post ultrasound-guided biopsy of right liver lesion. Aspiration was attempted, with no aspirate achieved. Signed, Yvone Neu. Miachel Roux, RPVI [facility] Electronically Signed   By: Gilmer Mor D.O.   On: 10/18/2023 11:23        Scheduled Meds:  arformoterol  15 mcg Nebulization BID   And   umeclidinium bromide  1 puff Inhalation Daily   atorvastatin  20 mg Oral Daily   Chlorhexidine Gluconate Cloth  6 each Topical Daily   dexamethasone  4 mg Oral BID   levETIRAcetam  750 mg Oral BID   metoprolol tartrate  25 mg Oral BID   montelukast  10 mg Oral QHS   multivitamin with minerals  1 tablet Oral Q breakfast   pantoprazole  40 mg Oral Daily   potassium chloride  40 mEq Oral BID   sertraline  50 mg Oral Daily   sodium chloride flush  10 mL Intravenous Q12H   sodium chloride flush  10-40 mL Intracatheter Q12H   Continuous Infusions:     LOS: 6 days    Time spent: 35 minutes    Dorcas Carrow, MD [facility]

## 2023-10-18 NOTE — Progress Notes (Signed)
Transition of Care [facility]) - Inpatient Brief Assessment   Patient Details  Name: [name] MRN: [id number] Date of Birth: 11/07/1961  Transition of Care [facility]) CM/SW Contact:    Janae Bridgeman, RN Phone Number: 10/18/2023, 12:36 PM   Clinical Narrative: CM met with the patient at the bedside prior to discharge to home tomorrow.  The patient lives with her grandson at the home and plans to have her daughter provide transportation to home tomorrow by car.  Patient is active with [facility] for PT/OT.  HH orders are in place.  Patient has dme at the home including home oxygen, 3:1, Rolator, and WC.  Patient's oxygen company is [employer].  No other TOC needs and patient is set up to discharge home with family tomorrow by car.   Transition of Care Asessment: Insurance and Status: (P) Insurance coverage has been reviewed Patient has primary care physician: (P) Yes Home environment has been reviewed: (P) Home with grandson Prior level of function:: (P) family assistance Prior/Current Home Services: (P) Current home services (Active with [facility] for PT/OT) Social Determinants of Health Reivew: (P) SDOH reviewed interventions complete Readmission risk has been reviewed: (P) Yes Transition of care needs: (P) transition of care needs identified, TOC will continue to follow

## 2023-10-18 NOTE — Plan of Care (Signed)

## 2023-10-18 NOTE — Progress Notes (Signed)
Physical Therapy Treatment Patient Details Name: [name] MRN: [id number] DOB: July 02, 1961 Today's Date: 10/18/2023   History of Present Illness 62 year old F admitted 11/29 with working diagnosis of neutropenic fever and diarrhea. 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  Last chemotherapy on 10/06/2023.    PT Comments  Mod I with bed mobility, CGA to transfer with cues for technique, and supervision for gait with rollator. Distance limited to 40' foot bouts x2 this afternoon. Pt feels grossly fatigued today. Eager to go home. Reviewed multiple LE exercises. Encouraged OOB often with staff and routine HEP performance during admission. Has grandson to help at home. Patient will continue to benefit from skilled physical therapy services to further improve independence with functional mobility.     If plan is discharge home, recommend the following: A little help with walking and/or transfers;Direct supervision/assist for medications management;Direct supervision/assist for financial management;Assist for transportation;Help with stairs or ramp for entrance;Supervision due to cognitive status   Can travel by private vehicle        Equipment Recommendations  None recommended by PT    Recommendations for Other Services       Precautions / Restrictions Precautions Precautions: Fall Precaution Comments: 2L o2 baseline Restrictions Weight Bearing Restrictions: No     Mobility  Bed Mobility Overal bed mobility: Modified Independent Bed Mobility: Supine to Sit, Sit to Supine     Supine to sit: Modified independent (Device/Increase time) Sit to supine: Modified independent (Device/Increase time)   General bed mobility comments: Mod I,  extra time to rise to EOB. Slow and effortful    Transfers Overall transfer level: Needs assistance Equipment used: Rolling walker (2 wheels) Transfers: Sit to/from Stand Sit to Stand: Contact guard assist           General transfer comment: CGA to rise from bed when cued for hand placement, to push up from bed rather than pull through rollator.    Ambulation/Gait Ambulation/Gait assistance: Supervision Gait Distance (Feet): 40 Feet (+40) Assistive device: Rollator (4 wheels) Gait Pattern/deviations: Decreased stride length, Decreased step length - right, Decreased step length - left Gait velocity: dec Gait velocity interpretation: <1.31 ft/sec, indicative of household ambulator   General Gait Details: Controls RW adequately without LOB. Supervision for safety with some drift noted. Fatigued easily today, no DOE noted. Needed seated rest break on rollator at 40', then continued back to room. On 3L.   Stairs             Wheelchair Mobility     Tilt Bed    Modified Rankin (Stroke Patients Only)       Balance Overall balance assessment: Needs assistance Sitting-balance support: No upper extremity supported, Feet supported Sitting balance-Leahy Scale: Good     Standing balance support: No upper extremity supported, Reliant on assistive device for balance, During functional activity Standing balance-Leahy Scale: Fair Standing balance comment: More stable with UE support                            Cognition Arousal: Alert Behavior During Therapy: Flat affect Overall Cognitive Status: No family/caregiver present to determine baseline cognitive functioning Area of Impairment: Problem solving  Problem Solving: Slow processing, Requires verbal cues          Exercises General Exercises - Lower Extremity Ankle Circles/Pumps: AROM, Both, 10 reps, Seated, Supine Quad Sets: Strengthening, Both, 10 reps,  Supine Gluteal Sets: Strengthening, Both, 10 reps, Seated Long Arc Quad: Strengthening, Both, 10 reps, Seated Straight Leg Raises: Strengthening, Both, 10 reps, Supine Hip Flexion/Marching: Strengthening, Both, 5 reps, Seated    General Comments        Pertinent Vitals/Pain Pain Assessment Pain Assessment: No/denies pain    Home Living                          Prior Function            PT Goals (current goals can now be found in the care plan section) Acute Rehab PT Goals Patient Stated Goal: to go home PT Goal Formulation: With patient/family Time For Goal Achievement: 10/28/23 Potential to Achieve Goals: Good Progress towards PT goals: Progressing toward goals    Frequency    Min 1X/week      PT Plan      Co-evaluation              AM-PAC PT "6 Clicks" Mobility   Outcome Measure  Help needed turning from your back to your side while in a flat bed without using bedrails?: None Help needed moving from lying on your back to sitting on the side of a flat bed without using bedrails?: None Help needed moving to and from a bed to a chair (including a wheelchair)?: A Little Help needed standing up from a chair using your arms (e.g., wheelchair or bedside chair)?: A Little Help needed to walk in hospital room?: A Little Help needed climbing 3-5 steps with a railing? : A Lot 6 Click Score: 19    End of Session Equipment Utilized During Treatment: Gait belt;Oxygen Activity Tolerance: Patient tolerated treatment well Patient left: with call bell/phone within reach;in bed;with bed alarm set   PT Visit Diagnosis: Muscle weakness (generalized) (M62.81);Difficulty in walking, not elsewhere classified (R26.2)     Time: 0454-0981 PT Time Calculation (min) (ACUTE ONLY): 19 min  Charges:    $Gait Training: 8-22 mins PT General Charges $$ ACUTE PT VISIT: 1 Visit                     [name], PT, DPT [facility]  Rehabilitation  Services Physical Therapist Office: [phone] Website: [email or web address]    Berton Mount 10/18/2023, 4:22 PM

## 2023-10-18 NOTE — Plan of Care (Signed)

## 2023-10-18 NOTE — Progress Notes (Signed)
[facility]   Reason for visit: Follow up on fever  Interval History: IR aspiration with no fluid of liver lesion; WBC 20.6; afebrile since initial fever 11/29.      Physical Exam: Constitutional:  Vitals:   10/18/23 0819 10/18/23 0823  BP:    Pulse:    Resp:    Temp:    SpO2: 90% 90%   patient appears in NAD Respiratory: Normal respiratory effort  Review of Systems: Constitutional: negative for fevers and chills  Lab Results  Component Value Date   WBC 20.6 (H) 10/18/2023   HGB 7.7 (L) 10/18/2023   HCT 23.7 (L) 10/18/2023   MCV 84.0 10/18/2023   PLT 45 (L) 10/18/2023    Lab Results  Component Value Date   CREATININE 0.80 10/18/2023   BUN 9 10/18/2023   NA 143 10/18/2023   K 3.3 (L) 10/18/2023   CL 107 10/18/2023   CO2 26 10/18/2023    Lab Results  Component Value Date   ALT 20 10/18/2023   AST 25 10/18/2023   ALKPHOS 62 10/18/2023     Microbiology: Recent Results (from the past 240 hour(s))  Resp panel by RT-PCR (RSV, Flu A&B, Covid) Anterior Nasal Swab     Status: None   Collection Time: 10/12/23  8:29 AM   Specimen: Anterior Nasal Swab  Result Value Ref Range Status   SARS Coronavirus 2 by RT PCR NEGATIVE NEGATIVE Final   Influenza A by PCR NEGATIVE NEGATIVE Final   Influenza B by PCR NEGATIVE NEGATIVE Final    Comment: (NOTE) The Xpert Xpress SARS-CoV-2/FLU/RSV plus assay is intended as an aid in the diagnosis of influenza from Nasopharyngeal swab specimens and should not be used as a sole basis for treatment. Nasal washings and aspirates are unacceptable for Xpert Xpress SARS-CoV-2/FLU/RSV testing.  Fact Sheet for Patients: BloggerCourse.com  Fact Sheet for Healthcare Providers: SeriousBroker.it  This test is not yet approved or cleared by the [place] FDA and has been authorized for detection and/or diagnosis of SARS-CoV-2 by FDA under an Emergency Use  Authorization (EUA). This EUA will remain in effect (meaning this test can be used) for the duration of the COVID-19 declaration under Section 564(b)(1) of the Act, 21 U.S.C. section 360bbb-3(b)(1), unless the authorization is terminated or revoked.     Resp Syncytial Virus by PCR NEGATIVE NEGATIVE Final    Comment: (NOTE) Fact Sheet for Patients: BloggerCourse.com  Fact Sheet for Healthcare Providers: SeriousBroker.it  This test is not yet approved or cleared by the [place] FDA and has been authorized for detection and/or diagnosis of SARS-CoV-2 by FDA under an Emergency Use Authorization (EUA). This EUA will remain in effect (meaning this test can be used) for the duration of the COVID-19 declaration under Section 564(b)(1) of the Act, 21 U.S.C. section 360bbb-3(b)(1), unless the authorization is terminated or revoked.  Performed at [facility], [address], [place], [place] [address]   Blood Culture (routine x 2)     Status: None   Collection Time: 10/12/23  8:29 AM   Specimen: BLOOD  Result Value Ref Range Status   Specimen Description BLOOD LEFT ANTECUBITAL  Final   Special Requests   Final    BOTTLES DRAWN AEROBIC AND ANAEROBIC Blood Culture results may not be optimal due to an inadequate volume of blood received in culture bottles   Culture   Final    NO GROWTH 5 DAYS Performed at [facility], [address], [place], [place] [address]    Report Status 10/17/2023 FINAL  Final  Blood Culture (routine x 2)     Status: None   Collection Time: 10/12/23  8:34 AM   Specimen: BLOOD  Result Value Ref Range Status   Specimen Description BLOOD BLOOD RIGHT HAND  Final   Special Requests   Final    BOTTLES DRAWN AEROBIC AND ANAEROBIC Blood Culture adequate volume   Culture   Final    NO GROWTH 5 DAYS Performed at [facility], [address] [place], [place] [address]    Report Status 10/17/2023  FINAL  Final  C Difficile Quick Screen w PCR reflex     Status: None   Collection Time: 10/12/23  5:57 PM   Specimen: STOOL  Result Value Ref Range Status   C Diff antigen NEGATIVE NEGATIVE Final   C Diff toxin NEGATIVE NEGATIVE Final   C Diff interpretation No C. difficile detected.  Final    Comment: Performed at [facility], [address], [place], [place] [address]    Impression/Plan:  1. Liver lesions - c/w metastatic disease.  Pathology sent by IR.  No signs of infection on aspiration. Will stop antibiotics  2.  Neutropenic fever - has been afebrile and neutropenia resolved.  No further indication for antibiotics.  3.  Renal insufficiency - creat elevated on vancomycin which has been stopped and now creat today wnl.    I will sign off, call with questions.

## 2023-10-19 ENCOUNTER — Ambulatory Visit: Payer: 59

## 2023-10-19 ENCOUNTER — Other Ambulatory Visit: Payer: Self-pay

## 2023-10-19 ENCOUNTER — Emergency Department (HOSPITAL_COMMUNITY): Payer: 59

## 2023-10-19 ENCOUNTER — Emergency Department (HOSPITAL_COMMUNITY)
Admission: EM | Admit: 2023-10-19 | Discharge: 2023-10-20 | Disposition: A | Discharge status: Home / Self Care | Payer: 59 | Attending: Emergency Medicine | Admitting: Emergency Medicine

## 2023-10-19 ENCOUNTER — Other Ambulatory Visit: Payer: Self-pay | Admitting: Physician Assistant

## 2023-10-19 ENCOUNTER — Telehealth: Payer: Self-pay

## 2023-10-19 DIAGNOSIS — W01198A Fall on same level from slipping, tripping and stumbling with subsequent striking against other object, initial encounter: Secondary | ICD-10-CM | POA: Diagnosis not present

## 2023-10-19 DIAGNOSIS — I10 Essential (primary) hypertension: Secondary | ICD-10-CM | POA: Insufficient documentation

## 2023-10-19 DIAGNOSIS — Z7982 Long term (current) use of aspirin: Secondary | ICD-10-CM | POA: Insufficient documentation

## 2023-10-19 DIAGNOSIS — W19XXXA Unspecified fall, initial encounter: Secondary | ICD-10-CM

## 2023-10-19 DIAGNOSIS — Z853 Personal history of malignant neoplasm of breast: Secondary | ICD-10-CM | POA: Insufficient documentation

## 2023-10-19 DIAGNOSIS — Z043 Encounter for examination and observation following other accident: Secondary | ICD-10-CM | POA: Insufficient documentation

## 2023-10-19 DIAGNOSIS — G893 Neoplasm related pain (acute) (chronic): Secondary | ICD-10-CM

## 2023-10-19 DIAGNOSIS — G936 Cerebral edema: Secondary | ICD-10-CM | POA: Diagnosis not present

## 2023-10-19 DIAGNOSIS — M5030 Other cervical disc degeneration, unspecified cervical region: Secondary | ICD-10-CM | POA: Insufficient documentation

## 2023-10-19 DIAGNOSIS — R5081 Fever presenting with conditions classified elsewhere: Secondary | ICD-10-CM | POA: Diagnosis not present

## 2023-10-19 DIAGNOSIS — M48061 Spinal stenosis, lumbar region without neurogenic claudication: Secondary | ICD-10-CM | POA: Diagnosis not present

## 2023-10-19 DIAGNOSIS — M47816 Spondylosis without myelopathy or radiculopathy, lumbar region: Secondary | ICD-10-CM | POA: Insufficient documentation

## 2023-10-19 DIAGNOSIS — Z79899 Other long term (current) drug therapy: Secondary | ICD-10-CM | POA: Diagnosis not present

## 2023-10-19 DIAGNOSIS — J449 Chronic obstructive pulmonary disease, unspecified: Secondary | ICD-10-CM | POA: Diagnosis not present

## 2023-10-19 DIAGNOSIS — D709 Neutropenia, unspecified: Secondary | ICD-10-CM | POA: Diagnosis not present

## 2023-10-19 LAB — CBC WITH DIFFERENTIAL/PLATELET
Abs Immature Granulocytes: 3.36 10*3/uL — ABNORMAL HIGH (ref 0.00–0.07)
Basophils Absolute: 0.1 10*3/uL (ref 0.0–0.1)
Basophils Relative: 1 %
Eosinophils Absolute: 0 10*3/uL (ref 0.0–0.5)
Eosinophils Relative: 0 %
HCT: 23.6 % — ABNORMAL LOW (ref 36.0–46.0)
Hemoglobin: 7.5 g/dL — ABNORMAL LOW (ref 12.0–15.0)
Immature Granulocytes: 15 %
Lymphocytes Relative: 2 %
Lymphs Abs: 0.5 10*3/uL — ABNORMAL LOW (ref 0.7–4.0)
MCH: 27.4 pg (ref 26.0–34.0)
MCHC: 31.8 g/dL (ref 30.0–36.0)
MCV: 86.1 fL (ref 80.0–100.0)
Monocytes Absolute: 1.1 10*3/uL — ABNORMAL HIGH (ref 0.1–1.0)
Monocytes Relative: 5 %
Neutro Abs: 16.8 10*3/uL — ABNORMAL HIGH (ref 1.7–7.7)
Neutrophils Relative %: 77 %
Platelets: 44 10*3/uL — ABNORMAL LOW (ref 150–400)
RBC: 2.74 MIL/uL — ABNORMAL LOW (ref 3.87–5.11)
RDW: 17.4 % — ABNORMAL HIGH (ref 11.5–15.5)
WBC: 22 10*3/uL — ABNORMAL HIGH (ref 4.0–10.5)
nRBC: 0.4 % — ABNORMAL HIGH (ref 0.0–0.2)

## 2023-10-19 LAB — COMPREHENSIVE METABOLIC PANEL
ALT: 21 U/L (ref 0–44)
AST: 20 U/L (ref 15–41)
Albumin: 2.2 g/dL — ABNORMAL LOW (ref 3.5–5.0)
Alkaline Phosphatase: 74 U/L (ref 38–126)
Anion gap: 6 (ref 5–15)
BUN: 7 mg/dL — ABNORMAL LOW (ref 8–23)
CO2: 28 mmol/L (ref 22–32)
Calcium: 8.3 mg/dL — ABNORMAL LOW (ref 8.9–10.3)
Chloride: 108 mmol/L (ref 98–111)
Creatinine, Ser: 0.85 mg/dL (ref 0.44–1.00)
GFR, Estimated: >60 mL/min (ref >60)
Glucose, Bld: 102 mg/dL — ABNORMAL HIGH (ref 70–99)
Potassium: 4 mmol/L (ref 3.5–5.1)
Sodium: 142 mmol/L (ref 135–145)
Total Bilirubin: 0.6 mg/dL (ref <1.2)
Total Protein: 4.5 g/dL — ABNORMAL LOW (ref 6.5–8.1)

## 2023-10-19 LAB — MAGNESIUM: Magnesium: 1.8 mg/dL (ref 1.7–2.4)

## 2023-10-19 MED ORDER — HYDROCODONE-ACETAMINOPHEN 5-325 MG PO TABS: 1.0000 | ORAL_TABLET | Freq: Four times a day (QID) | ORAL | 0 refills | Status: DC | PRN

## 2023-10-19 MED ORDER — HEPARIN SOD (PORK) LOCK FLUSH 100 UNIT/ML IV SOLN
500.0000 [IU] | INTRAVENOUS | Status: AC | PRN
  Administered 2023-10-19: 500 [IU]

## 2023-10-19 MED ORDER — MAGNESIUM 400 MG PO CAPS: 400.0000 mg | ORAL_CAPSULE | Freq: Every day | ORAL | 0 refills | Status: DC

## 2023-10-19 MED ORDER — DEXAMETHASONE 4 MG PO TABS: 4.0000 mg | ORAL_TABLET | Freq: Two times a day (BID) | ORAL | Status: DC

## 2023-10-19 MED ORDER — POTASSIUM CHLORIDE CRYS ER 20 MEQ PO TBCR: 20.0000 meq | EXTENDED_RELEASE_TABLET | Freq: Every day | ORAL | 0 refills | Status: DC

## 2023-10-19 NOTE — ED Notes (Signed)
Patient transported to X-ray 

## 2023-10-19 NOTE — Telephone Encounter (Signed)
Pt LVM and said that she was in pain and needed some pain medication.  Spoke with patient and she states that her "legs hurt and its a nagging pain that comes and goes long with belly pain that comes and goes." Per Cassie, PA- sent in pain medication to get through the weekend until she sees Palliative on Monday 12/9.

## 2023-10-19 NOTE — Discharge Summary (Signed)
Physician Discharge Summary  [name] HYQ:[id number] DOB: 1961/08/22 DOA: 10/12/2023  PCP: [name]., FNP  Admit date: 10/12/2023 Discharge date: 10/19/2023  Admitted From: Home Disposition: Home with home health  Recommendations for Outpatient Follow-up:  Follow up with PCP in 1-2 weeks Please obtain BMP/CBC in one week Oncology to schedule follow-up  Home Health: PT/OT Equipment/Devices: Oxygen, available at home  Discharge Condition: Stable CODE STATUS: DNR with limited intervention Diet recommendation: Regular diet, nutritional supplements  Discharge summary: 62 year old with history of 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  She was admitted with working diagnosis of neutropenic fever and acute thrombocytopenia on broad-spectrum antibiotics.  Last chemotherapy was 10/06/2023.  In the emergency room temperature was 100.6, tachycardic with heart rate 136.  WBC 0.9 with ANC of 0.6.  Platelet 21 with normal baseline platelets.  COVID-19 influenza and RSV negative.  UA and chest x-ray negative.  Magnesium was 1.  She was admitted with broad-spectrum antibiotics with working diagnosis of neutropenic fever and diarrhea.  Patient remained in the hospital.  Underwent different procedures.  Ultimately no source of infection was found.  She is going home without antibiotics.  Clinically improving.  #1 Neutropenic fever:  Likely due to recent chemotherapy.  Presented with evidence of sepsis with fever, tachycardia and neutropenia.  CT scan abdomen pelvis concerning for possible hepatic and splenic abscess or colitis.  MRI abdomen concern for possible liver abscess or liver lesion.  Leukopenia and neutropenia has resolved.  She has now leukocytosis. Treated with different  antibiotics including cefepime, vancomycin and Flagyl and then ultimately with Unasyn.  With no evidence of bacterial infection, antibiotics were discontinued and patient was monitored in the hospital.  She was also followed by ID team.   Underwent liver lesion biopsy, no abscess was found.  Tissue biopsies pending.   WBC count is 22,000 which is likely due to her receiving Neulasta and also on high-dose steroids.  Will need to recheck in 1 week at cancer center.     Pancytopenia, acute blood loss anemia: Patient received 2 units of platelets and 1 unit of RBC. Fairly stable today. Hemoglobin 7.5, platelets 44 today.   Stage IV non-small cell lung cancer with brain and osseous mets: On radiation and chemotherapy.  Last chemo 11/23.  Seen by oncology in the hospital.  Currently on Decadron 4 mg 3 times daily. Will taper Decadron 4 mg twice daily and discharge on this doses as recommended by oncology.   Diarrhea and colitis: Likely due to chemo.  C. difficile negative.  Improved.   AKI: Creatinine back to baseline.   Hypomagnesemia: Severe and persistently low.  Adequate today.  Will discharge patient with scheduled doses of potassium and magnesium replacement.   COPD, sarcoidosis, hypoxemia: On 2 L oxygen.  On Singulair and breathing treatments.   Chronically sick, currently stable for discharge.    Discharge Diagnoses:  Principal Problem:   Fever and neutropenia (HCC) Active Problems:   Weakness   Thrombocytopenia (HCC)   Malignant neoplasm of right lung (HCC)   Sepsis [facility])    Discharge Instructions  Discharge Instructions     Diet general   Complete by: As directed    Increase activity slowly   Complete by: As directed    No wound care  Complete by: As directed       Allergies as of 10/19/2023       Reactions   Codeine Other (See Comments)   Avoids-felt bad when taken before Pt claims she can have this but does not like to take        Medication List      STOP taking these medications    hydroxychloroquine 200 MG tablet Commonly known as: PLAQUENIL   pantoprazole 40 MG tablet Commonly known as: PROTONIX       TAKE these medications    acetaminophen 325 MG tablet Commonly known as: TYLENOL Take 650 mg by mouth every 6 (six) hours as needed for mild pain or moderate pain.   albuterol 108 (90 Base) MCG/ACT inhaler Commonly known as: VENTOLIN HFA INHALE 2 PUFFS EVERY 6 HOURS AS NEEDED FOR WHEEZING OR SHORTNESS OF BREATH What changed:  See the new instructions. Another medication with the same name was removed. Continue taking this medication, and follow the directions you see here.   aspirin 81 MG chewable tablet Chew 1 tablet (81 mg total) by mouth daily.   atorvastatin 20 MG tablet Commonly known as: LIPITOR TAKE 1 TABLET (20 MG TOTAL) BY MOUTH DAILY.   dexamethasone 4 MG tablet Commonly known as: DECADRON Take 1 tablet (4 mg total) by mouth 2 (two) times daily. What changed: when to take this   fluticasone 50 MCG/ACT nasal spray Commonly known as: FLONASE USE 2 SPRAYS IN BOTH  NOSTRILS DAILY AS NEEDED  FOR ALLERGIES What changed: See the new instructions.   levETIRAcetam 750 MG tablet Commonly known as: KEPPRA Take 1 tablet (750 mg total) by mouth 2 (two) times daily.   lidocaine-prilocaine cream Commonly known as: EMLA Apply to affected area once What changed:  how much to take how to take this when to take this additional instructions   Magnesium 400 MG Caps Take 400 mg by mouth daily at 6 (six) AM.   metoprolol tartrate 25 MG tablet Commonly known as: LOPRESSOR Take 1 tablet (25 mg total) by mouth daily.   montelukast 10 MG tablet Commonly known as: SINGULAIR Take 10 mg by mouth at bedtime.   multivitamin capsule Take 1 capsule by mouth daily with breakfast.   omeprazole 20 MG capsule Commonly known as: PRILOSEC TAKE 1 CAPSULE EVERY DAY What changed: when to take this   ondansetron 4 MG  tablet Commonly known as: ZOFRAN Take 1 tablet (4 mg total) by mouth every 6 (six) hours as needed for nausea.   OXYGEN Inhale 2-3 L/min into the lungs See admin instructions. Inhale 2-3 L/min into the lungs CONTINUOUSLY; 2 L/min at rest and 3 L/min if exerted   potassium chloride SA 20 MEQ tablet Commonly known as: KLOR-CON M Take 1 tablet (20 mEq total) by mouth daily for 14 days.   prochlorperazine 10 MG tablet Commonly known as: COMPAZINE Take 1 tablet (10 mg total) by mouth every 6 (six) hours as needed for nausea or vomiting.   sertraline 50 MG tablet Commonly known as: ZOLOFT TAKE 1 TABLET EVERY DAY   Stiolto Respimat 2.5-2.5 MCG/ACT Aers Generic drug: Tiotropium Bromide-Olodaterol USE 2 INHALATIONS BY MOUTH DAILY        Follow-up Information     [facility]   Specialty: Home Health Services Why: [facility] will provide home health services.  They will call you in the next 24-48 hours to set up services. Contact information: [address] [place] [address] [phone]                Allergies  Allergen Reactions   Codeine Other (See Comments)    Avoids-felt bad when taken before Pt claims she can have this but does not like to take    Consultations: Oncology ID   Procedures/Studies: DG HIP UNILAT WITH PELVIS 2-3 VIEWS RIGHT  Result Date: 10/18/2023 CLINICAL DATA:  Fall EXAM: DG HIP (WITH OR WITHOUT PELVIS) 2-3V RIGHT COMPARISON:  CT 10/13/2023 FINDINGS: No fracture or malalignment.  Moderate hip arthritis. IMPRESSION: No acute osseous abnormality. Electronically Signed   By: Jasmine Pang M.D.   On: 10/18/2023 23:45   DG HIP UNILAT WITH PELVIS 2-3 VIEWS LEFT  Result Date: 10/18/2023 CLINICAL DATA:  Fall EXAM: DG HIP (WITH OR WITHOUT PELVIS) 2-3V LEFT COMPARISON:  CT 10/13/2023 FINDINGS: SI joints are non widened. Pubic symphysis and rami are intact. No acute fracture or malalignment. IMPRESSION: No  acute osseous abnormality Electronically Signed   By: Jasmine Pang M.D.   On: 10/18/2023 23:44   US BIOPSY (LIVER)  Result Date: 10/18/2023 INDICATION: 62 year old female with a history liver lesion, possible tumor versus abscess EXAM: ULTRASOUND-GUIDED LIVER LESION BIOPSY MEDICATIONS: None. ANESTHESIA/SEDATION: Moderate (conscious) sedation was employed during this procedure. A total of Versed 1.5 mg and Fentanyl 50 mcg was administered intravenously by the radiology nurse. Total intra-service moderate Sedation Time: 18 minutes. The patient's level of consciousness and vital signs were monitored continuously by radiology nursing throughout the procedure under my direct supervision. COMPLICATIONS: NONE PROCEDURE: Informed written consent was obtained from the patient after a thorough discussion of the procedural risks, benefits and alternatives. All questions were addressed. Maximal Sterile Barrier Technique was utilized including caps, mask, sterile gowns, sterile gloves, sterile drape, hand hygiene and skin antiseptic. A timeout was performed prior to the initiation of the procedure. Ultrasound survey of the right liver lobe performed with images stored and sent to PACs. The lesion in segment 6 is hypoechoic and quite small. The only way to visualize the lesion for a possible biopsy was from a medial to lateral approach from the anterior. The right lower thorax/right upper abdomen was prepped with chlorhexidine in a sterile fashion, and a sterile drape was applied covering the operative field. A sterile gown and sterile gloves were used for the procedure. Local anesthesia was provided with 1% Lidocaine. The patient was prepped and draped sterilely and the skin and subcutaneous tissues were generously infiltrated with 1% lidocaine. A 17 gauge introducer needle was then advanced under ultrasound guidance in a subcostal location into the right liver lobe. The stylet was removed, and aspiration was attempted. No  aspirate returned. Multiple separate 18 gauge core biopsy were then retrieved. Samples were placed into formalin for transportation to the lab. The needle was removed, and a final ultrasound image was performed. The patient tolerated the procedure well and remained hemodynamically stable throughout. No complications were encountered and no significant blood loss was encounter. IMPRESSION: Status post ultrasound-guided biopsy of right liver lesion. Aspiration was attempted, with no aspirate achieved. Signed, Yvone Neu. Miachel Roux, RPVI [facility] Electronically Signed   By: Gilmer Mor D.O.   On: 10/18/2023 11:23   MR ABDOMEN W WO CONTRAST  Result Date: 10/14/2023 CLINICAL DATA:  Liver lesion EXAM: MRI ABDOMEN WITHOUT AND WITH CONTRAST TECHNIQUE: Multiplanar multisequence MR imaging of the abdomen was performed both before and after the administration of intravenous contrast.  CONTRAST:  7mL GADAVIST GADOBUTROL 1 MMOL/ML IV SOLN COMPARISON:  CT 10/13/2023.  Older CT 02/19/2019. FINDINGS: Lower chest: Tiny left and small right pleural effusion. Heart enlarged is some linear signal changes in the lung bases. Atelectasis or scarring. Hepatobiliary: No biliary ductal dilatation. Gallbladder is nondilated. Patent portal vein. Complex lesion seen posteriorly in the right hepatic lobe segment 6/7 is again identified today. This lesion has a fluid-fluid level measures 16 by 12 mm. There is some marginal enhancement of the lesion as well as some mild adjacent edema. Again differential would include an abscess. Smaller lesion identified just anterior and superior to this lesion is dark on precontrast T1, bright on T2 and also has some marginal enhancement measuring 12 mm. The lesion seen in segment 7 more superiorly is less visible today but again there is significant breathing motion Pancreas: No mass, inflammatory changes, or other parenchymal abnormality  identified. Spleen: Small splenic lesion again identified posteriorly which is dark on T1, moderately bright on T2. Assessment of enhancement is difficult with motion. Again lesion measures 8 mm. Adrenals/Urinary Tract: Adrenal glands are preserved. Benign bilateral renal cystic foci are identified. Stomach/Bowel: Visualized bowel is nondilated. The stranding along the course of the mid descending colon is again noted. Vascular/Lymphatic: Mild atherosclerotic changes along the aorta. Normal caliber aorta and IVC. No discrete abnormal lymph node enlargement in the visualized abdomen. Other:  Evaluation significantly limited by motion. Musculoskeletal: Degenerative changes along the spine. IMPRESSION: Three liver lesions identified. Once again the dominant focus posteriorly in the right hepatic lobe has a rim enhancement as well as abnormal diffusion. This has concerning for abscess over a malignant lesion. The 2 other smaller lesions are more indeterminate and would recommend close follow-up. Indeterminate small splenic lesion.  Attention on follow-up. Small right and tiny left pleural effusion. Evaluation significantly limited by motion. Electronically Signed   By: Karen Kays M.D.   On: 10/14/2023 17:14   CT ABDOMEN PELVIS W CONTRAST  Result Date: 10/13/2023 CLINICAL DATA:  Lung cancer.  Sepsis. EXAM: CT ABDOMEN AND PELVIS WITH CONTRAST TECHNIQUE: Multidetector CT imaging of the abdomen and pelvis was performed using the standard protocol following bolus administration of intravenous contrast. RADIATION DOSE REDUCTION: This exam was performed according to the departmental dose-optimization program which includes automated exposure control, adjustment of the mA and/or kV according to patient size and/or use of iterative reconstruction technique. CONTRAST:  75mL OMNIPAQUE IOHEXOL 300 MG/ML  SOLN COMPARISON:  PET-CT 08/03/2023. standard CT April 2020 FINDINGS: Lower chest: Bronchiectasis seen along the lung  bases. Breathing motion. Dependent areas of atelectasis and scarring. Trace pleural fluid on the right. Right-sided fat containing diaphragmatic hernia again seen. Hepatobiliary: Nodular contours of the liver. Cystic lesion posterior along the right hepatic lobe on series 3, image 21 has a capsule and some associated parenchymal edema surrounding lesion measuring 19 x 16 mm. Small focus just above this well but more simple appearing measuring 10 mm and a vague low-density area in segment 7 measuring 7 mm on series 3, image 10. These are not clearly seen on the previous examinations. Patent portal vein. Gallbladder is mildly distended. Pancreas: Unremarkable. No pancreatic ductal dilatation or surrounding inflammatory changes. Spleen: Spleen is nonenlarged. There is a cystic area posterior along the spleen which was not seen previously measuring 8 mm. Not clearly a simple cyst Adrenals/Urinary Tract: Adrenal glands are preserved. Benign bilateral renal cystic foci identified, Bosniak 1 and 2 lesions. No enhancing mass or collecting system dilatation. The ureters  have normal course and caliber extending down to the urinary bladder. Preserved contours of the urinary bladder. Stomach/Bowel: Stomach is decompressed. Small bowel is nondilated. The large bowel has a normal course and caliber. Left-sided areas of wall thickening identified with diverticula and stranding. Please correlate for a colitis. Mild changes as well along the sigmoid and rectum. Fluid along the lumen of the right side of the colon normal appendix seen in the right hemipelvis. Posteromedial to the cecum. Vascular/Lymphatic: Aortic atherosclerosis. No enlarged abdominal or pelvic lymph nodes. Reproductive: Status post hysterectomy. No adnexal masses. Other: Anasarca. Musculoskeletal: Degenerative changes seen of the spine and pelvis particularly of the right hip. IMPRESSION: New complex cystic areas in the right hepatic lobe. Based on appearance this  has worrisome for potential abscess formation but there is a differential. Please correlate with clinical presentation. If needed MRI evaluation could be considered. Largest measures 19 mm in the right hepatic lobe. Small cystic area in the spleen is mildly complex and more indeterminate. This also could be assessed on follow-up. Wall thickening with stranding along the descending colon greater than the rectosigmoid colon. Please correlate for a colitis. No obstruction. Recommend follow-up. Tiny right pleural effusion. Electronically Signed   By: Karen Kays M.D.   On: 10/13/2023 14:21   DG Chest Port 1 View  Result Date: 10/12/2023 CLINICAL DATA:  Possible sepsis. EXAM: PORTABLE CHEST 1 VIEW COMPARISON:  09/17/2023 and older exams.  CT, 09/19/2023. FINDINGS: Cardiac silhouette is normal in size. No convincing mediastinal or hilar masses. Right anterior chest wall Port-A-Cath is stable. Right-sided volume loss with mediastinal shift, stable. There is an ill-defined masslike opacity in the right upper lobe, unchanged. Stable interstitial type opacities right mid lung and both lung bases. No new lung abnormalities. No convincing pleural effusion.  No pneumothorax. Skeletal structures are grossly intact. Surgical vascular clips on the right reflect prior breast surgery, also stable IMPRESSION: 1. No significant change from the recent prior chest radiograph. 2. 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 3. Right-sided volume loss with mediastinal shift. 4. No new abnormalities. Electronically Signed   By: Amie Portland M.D.   On: 10/12/2023 08:45   (Echo, Carotid, EGD, Colonoscopy, ERCP)    Subjective: Patient seen and examined.  Denies any complaints.  Last night she attempted to go to the bathroom and slid off the bed.  X-rays were negative.  Eager to go home.  Called and discussed, updated her daughter on the phone.   Discharge Exam: Vitals:   10/19/23 0743 10/19/23 0805  BP:  (!) 143/71   Pulse: 68   Resp: 16   Temp: 98.6 F (37 C)   SpO2: 100% 98%   Vitals:   10/18/23 1919 10/19/23 0024 10/19/23 0743 10/19/23 0805  BP: (!) 144/71 134/72 (!) 143/71   Pulse: 70 64 68   Resp: 18 18 16    Temp: 98.3 F (36.8 C) 98.7 F (37.1 C) 98.6 F (37 C)   TempSrc: Oral Oral Oral   SpO2: 98% 100% 100% 98%  Weight:      Height:        General: Pt is alert, awake, not in acute distress Chronically sick looking.  Not in any distress.  On 2 L oxygen. Cardiovascular: RRR, S1/S2 +, no rubs, no gallops Respiratory: CTA bilaterally, no wheezing, no rhonchi Abdominal: Soft, NT, ND, bowel sounds + Extremities: no edema, no cyanosis Generalized weakness.    The results of significant diagnostics from this hospitalization (including  imaging, microbiology, ancillary and laboratory) are listed below for reference.     Microbiology: Recent Results (from the past 240 hour(s))  Resp panel by RT-PCR (RSV, Flu A&B, Covid) Anterior Nasal Swab     Status: None   Collection Time: 10/12/23  8:29 AM   Specimen: Anterior Nasal Swab  Result Value Ref Range Status   SARS Coronavirus 2 by RT PCR NEGATIVE NEGATIVE Final   Influenza A by PCR NEGATIVE NEGATIVE Final   Influenza B by PCR NEGATIVE NEGATIVE Final    Comment: (NOTE) The Xpert Xpress SARS-CoV-2/FLU/RSV plus assay is intended as an aid in the diagnosis of influenza from Nasopharyngeal swab specimens and should not be used as a sole basis for treatment. Nasal washings and aspirates are unacceptable for Xpert Xpress SARS-CoV-2/FLU/RSV testing.  Fact Sheet for Patients: BloggerCourse.com  Fact Sheet for Healthcare Providers: SeriousBroker.it  This test is not yet approved or cleared by the [place] FDA and has been authorized for detection and/or diagnosis of SARS-CoV-2 by FDA under an Emergency Use Authorization (EUA). This EUA will remain in effect (meaning this  test can be used) for the duration of the COVID-19 declaration under Section 564(b)(1) of the Act, 21 U.S.C. section 360bbb-3(b)(1), unless the authorization is terminated or revoked.     Resp Syncytial Virus by PCR NEGATIVE NEGATIVE Final    Comment: (NOTE) Fact Sheet for Patients: BloggerCourse.com  Fact Sheet for Healthcare Providers: SeriousBroker.it  This test is not yet approved or cleared by the [place] FDA and has been authorized for detection and/or diagnosis of SARS-CoV-2 by FDA under an Emergency Use Authorization (EUA). This EUA will remain in effect (meaning this test can be used) for the duration of the COVID-19 declaration under Section 564(b)(1) of the Act, 21 U.S.C. section 360bbb-3(b)(1), unless the authorization is terminated or revoked.  Performed at [facility], [address], [place], [place] [address]   Blood Culture (routine x 2)     Status: None   Collection Time: 10/12/23  8:29 AM   Specimen: BLOOD  Result Value Ref Range Status   Specimen Description BLOOD LEFT ANTECUBITAL  Final   Special Requests   Final    BOTTLES DRAWN AEROBIC AND ANAEROBIC Blood Culture results may not be optimal due to an inadequate volume of blood received in culture bottles   Culture   Final    NO GROWTH 5 DAYS Performed at [facility], [address] [place], [place] [address]    Report Status 10/17/2023 FINAL  Final  Blood Culture (routine x 2)     Status: None   Collection Time: 10/12/23  8:34 AM   Specimen: BLOOD  Result Value Ref Range Status   Specimen Description BLOOD BLOOD RIGHT HAND  Final   Special Requests   Final    BOTTLES DRAWN AEROBIC AND ANAEROBIC Blood Culture adequate volume   Culture   Final    NO GROWTH 5 DAYS Performed at [facility], [address], [place], [place] [address]    Report Status 10/17/2023 FINAL  Final  C Difficile Quick Screen w PCR reflex     Status:  None   Collection Time: 10/12/23  5:57 PM   Specimen: STOOL  Result Value Ref Range Status   C Diff antigen NEGATIVE NEGATIVE Final   C Diff toxin NEGATIVE NEGATIVE Final   C Diff interpretation No C. difficile detected.  Final    Comment: Performed at [facility]  [facility], [address] [place], [place] [address]     Labs: BNP (last 3 results) No results for input(s): "BNP" in the last 8760 hours. Basic Metabolic Panel: Recent Labs  Lab 10/13/23 0443 10/14/23 0817 10/15/23 0744 10/16/23 0600 10/17/23 0404 10/18/23 0219 10/19/23 0252  NA 142   < > 139 140 143 143 142  K 2.7*   < > 4.0 3.3* 3.5 3.3* 4.0  CL 103   < > 105 104 111 107 108  CO2 30   < > 26 25 26 26 28   GLUCOSE 77   < > 130* 100* 112* 124* 102*  BUN 11   < > 12 13 11 9  7*  CREATININE 0.72   < > 0.92 1.28* 0.91 0.80 0.85  CALCIUM 7.1*   < > 8.2* 8.2* 8.1* 8.4* 8.3*  MG 1.6*   < > 1.6* 1.8 1.5* 2.2 1.8  PHOS 2.5  --  1.8* 2.9  --   --   --    < > = values in this interval not displayed.   Liver Function Tests: Recent Labs  Lab 10/13/23 0443 10/14/23 0817 10/15/23 0744 10/16/23 0600 10/17/23 0404 10/18/23 0219 10/19/23 0252  AST 15 12*  --   --  18 25 20   ALT 14 12  --   --  16 20 21   ALKPHOS 43 50  --   --  58 62 74  BILITOT 0.8 0.8  --   --  0.7 0.7 0.6  PROT 4.8* 5.2*  --   --  4.7* 4.7* 4.5*  ALBUMIN 2.2* 2.3* 2.4* 2.3* 2.2* 2.2* 2.2*   No results for input(s): "LIPASE", "AMYLASE" in the last 168 hours. No results for input(s): "AMMONIA" in the last 168 hours. CBC: Recent Labs  Lab 10/13/23 0443 10/14/23 0940 10/15/23 0744 10/16/23 0600 10/17/23 0404 10/17/23 1027 10/18/23 0219 10/19/23 0252  WBC 1.9*   < > 6.3 10.6* 16.4*  --  20.6* 22.0*  NEUTROABS 1.6*  --   --   --   --   --  15.5* 16.8*  HGB 7.2*   < > 7.6* 7.3* 6.4* 8.0* 7.7* 7.5*  HCT 23.3*   < > 24.9* 23.4* 20.2* 24.7* 23.7* 23.6*  MCV 84.1   < > 83.6 83.6 83.1  --  84.0 86.1  PLT 14*   < > 22* 21* 52*  --  45* 44*   < > =  values in this interval not displayed.   Cardiac Enzymes: No results for input(s): "CKTOTAL", "CKMB", "CKMBINDEX", "TROPONINI" in the last 168 hours. BNP: Invalid input(s): "POCBNP" CBG: No results for input(s): "GLUCAP" in the last 168 hours. D-Dimer No results for input(s): "DDIMER" in the last 72 hours. Hgb A1c No results for input(s): "HGBA1C" in the last 72 hours. Lipid Profile No results for input(s): "CHOL", "HDL", "LDLCALC", "TRIG", "CHOLHDL", "LDLDIRECT" in the last 72 hours. Thyroid function studies No results for input(s): "TSH", "T4TOTAL", "T3FREE", "THYROIDAB" in the last 72 hours.  Invalid input(s): "FREET3" Anemia work up No results for input(s): "VITAMINB12", "FOLATE", "FERRITIN", "TIBC", "IRON", "RETICCTPCT" in the last 72 hours. Urinalysis    Component Value Date/Time   COLORURINE YELLOW 10/12/2023 0930   APPEARANCEUR HAZY (A) 10/12/2023 0930   LABSPEC 1.010 10/12/2023 0930   PHURINE 7.0 10/12/2023 0930   GLUCOSEU NEGATIVE 10/12/2023 0930   HGBUR MODERATE (A) 10/12/2023 0930   HGBUR small 05/13/2010 1505   BILIRUBINUR NEGATIVE 10/12/2023 0930   BILIRUBINUR negative 12/08/2019  0000   BILIRUBINUR NEG 12/29/2015 1444   KETONESUR 5 (A) 10/12/2023 0930   PROTEINUR 30 (A) 10/12/2023 0930   UROBILINOGEN 0.2 12/08/2019 0000   UROBILINOGEN 0.2 04/17/2015 1618   NITRITE NEGATIVE 10/12/2023 0930   LEUKOCYTESUR NEGATIVE 10/12/2023 0930   Sepsis Labs Recent Labs  Lab 10/16/23 0600 10/17/23 0404 10/18/23 0219 10/19/23 0252  WBC 10.6* 16.4* 20.6* 22.0*   Microbiology Recent Results (from the past 240 hour(s))  Resp panel by RT-PCR (RSV, Flu A&B, Covid) Anterior Nasal Swab     Status: None   Collection Time: 10/12/23  8:29 AM   Specimen: Anterior Nasal Swab  Result Value Ref Range Status   SARS Coronavirus 2 by RT PCR NEGATIVE NEGATIVE Final   Influenza A by PCR NEGATIVE NEGATIVE Final   Influenza B by PCR NEGATIVE NEGATIVE Final    Comment: (NOTE) The  Xpert Xpress SARS-CoV-2/FLU/RSV plus assay is intended as an aid in the diagnosis of influenza from Nasopharyngeal swab specimens and should not be used as a sole basis for treatment. Nasal washings and aspirates are unacceptable for Xpert Xpress SARS-CoV-2/FLU/RSV testing.  Fact Sheet for Patients: BloggerCourse.com  Fact Sheet for Healthcare Providers: SeriousBroker.it  This test is not yet approved or cleared by the [place] FDA and has been authorized for detection and/or diagnosis of SARS-CoV-2 by FDA under an Emergency Use Authorization (EUA). This EUA will remain in effect (meaning this test can be used) for the duration of the COVID-19 declaration under Section 564(b)(1) of the Act, 21 U.S.C. section 360bbb-3(b)(1), unless the authorization is terminated or revoked.     Resp Syncytial Virus by PCR NEGATIVE NEGATIVE Final    Comment: (NOTE) Fact Sheet for Patients: BloggerCourse.com  Fact Sheet for Healthcare Providers: SeriousBroker.it  This test is not yet approved or cleared by the [place] FDA and has been authorized for detection and/or diagnosis of SARS-CoV-2 by FDA under an Emergency Use Authorization (EUA). This EUA will remain in effect (meaning this test can be used) for the duration of the COVID-19 declaration under Section 564(b)(1) of the Act, 21 U.S.C. section 360bbb-3(b)(1), unless the authorization is terminated or revoked.  Performed at [facility], [address] [place], [place] [address]   Blood Culture (routine x 2)     Status: None   Collection Time: 10/12/23  8:29 AM   Specimen: BLOOD  Result Value Ref Range Status   Specimen Description BLOOD LEFT ANTECUBITAL  Final   Special Requests   Final    BOTTLES DRAWN AEROBIC AND ANAEROBIC Blood Culture results may not be optimal due to an inadequate volume of blood received in culture  bottles   Culture   Final    NO GROWTH 5 DAYS Performed at [facility], [address] [place], [place] [address]    Report Status 10/17/2023 FINAL  Final  Blood Culture (routine x 2)     Status: None   Collection Time: 10/12/23  8:34 AM   Specimen: BLOOD  Result Value Ref Range Status   Specimen Description BLOOD BLOOD RIGHT HAND  Final   Special Requests   Final    BOTTLES DRAWN AEROBIC AND ANAEROBIC Blood Culture adequate volume   Culture   Final    NO GROWTH 5 DAYS Performed at [facility], [address] [place], [place] [address]    Report Status 10/17/2023 FINAL  Final  C Difficile Quick Screen w PCR reflex     Status:  None   Collection Time: 10/12/23  5:57 PM   Specimen: STOOL  Result Value Ref Range Status   C Diff antigen NEGATIVE NEGATIVE Final   C Diff toxin NEGATIVE NEGATIVE Final   C Diff interpretation No C. difficile detected.  Final    Comment: Performed at [facility], [address], [place], [place] [address]     Time coordinating discharge: 35 minutes  SIGNED:   Dorcas Carrow, MD  [facility] 10/19/2023, 10:44 AM

## 2023-10-19 NOTE — Progress Notes (Unsigned)
Palliative Medicine [facility]  [phone] [phone]   Name: ANTONIO SUPPA Date: 10/19/2023 MRN: [id number]  DOB: 1961/09/30  Patient Care Team: [name]., FNP as PCP - General (Family Medicine) Serena Croissant, MD as Consulting Physician (Hematology and Oncology) Almond Lint, MD as Consulting Physician (General Surgery) Margaretmary Dys, MD as Consulting Physician (Radiation Oncology) Si Gaul, MD as Consulting Physician (Oncology)    REASON FOR CONSULTATION: MARIDELL VOLMER is a 62 y.o. 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 Patient also has a history of right breast cancer (07/2015), HTN, depression, and HLD.  Palliative ask to see for symptom management and goals of care.    SOCIAL HISTORY:     reports that she quit smoking about 19 years ago. Her smoking use included cigarettes. She started smoking about 39 years ago. She has a 20 pack-year smoking history. She has never used smokeless tobacco. She reports that she does not drink alcohol and does not use drugs.  ADVANCE DIRECTIVES:  Advanced directives on file name Madai Hernandezgarci as primary and Lylli Mckeough as secondary decision makers should the patient become unable to speak for themselves.   CODE STATUS: DNR  PAST MEDICAL HISTORY: Past Medical History:  Diagnosis Date  . Acute on chronic respiratory failure (HCC) 10/09/2016  . Allergic rhinitis   . Breast cancer (HCC) 2016   right breast  . COPD, mild (HCC) FOLLOWED BY DR [name]  . Depression   . GERD (gastroesophageal reflux disease)   . HTN (hypertension)   . Hypertension   . Iron deficiency anemia   . Long-term current use of steroids SYMBICORT INHALER  . 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 Overweight (BMI 25.0-29.9) 05/11/2016  . Palpitations   . Personal history of radiation therapy 2016  . Sarcoidosis  STABLE PER CXR JUNE 2013  . Shortness of breath   . Sickle cell trait (HCC)     PAST SURGICAL HISTORY:  Past Surgical History:  Procedure Laterality Date  . ADJUSTABLE SUTURE MANIPULATION  05/22/2012   Procedure: ADJUSTABLE SUTURE MANIPULATION;  Surgeon: Corinda Gubler, MD;  Location: [facility];  Service: Ophthalmology;  Laterality: Right;  . BREAST LUMPECTOMY Right 2016  . BRONCHIAL BIOPSY  07/09/2023   Procedure: BRONCHIAL BIOPSIES;  Surgeon: Leslye Peer, MD;  Location: [facility] ENDOSCOPY;  Service: Pulmonary;;  . BRONCHIAL BRUSHINGS  07/09/2023   Procedure: BRONCHIAL BRUSHINGS;  Surgeon: Leslye Peer, MD;  Location: [facility] ENDOSCOPY;  Service: Pulmonary;;  . BRONCHIAL NEEDLE ASPIRATION BIOPSY  07/09/2023   Procedure: BRONCHIAL NEEDLE ASPIRATION BIOPSIES;  Surgeon: Leslye Peer, MD;  Location: [facility] ENDOSCOPY;  Service: Pulmonary;;  . CESAREAN SECTION  1986   W/ BILATERAL TUBAL LIGATION  . COLONOSCOPY WITH PROPOFOL N/A 02/21/2019   Procedure: COLONOSCOPY WITH PROPOFOL;  Surgeon: Hilarie Fredrickson, MD;  Location: [facility] ENDOSCOPY;  Service: Endoscopy;  Laterality: N/A;  . EYE SURGERY    . IR IMAGING GUIDED PORT INSERTION  08/01/2023  . MEDIAN RECTUS REPAIR  05/22/2012   Procedure: MEDIAN RECTUS REPAIR;  Surgeon: Corinda Gubler, MD;  Location: [facility];  Service: Ophthalmology;  Laterality: Bilateral;  INFERIOR RECTUS RESECTION WITH ADJUSTIBLE SUTURES RIGHT EYE   . POLYPECTOMY  02/21/2019   Procedure: POLYPECTOMY;  Surgeon: Hilarie Fredrickson, MD;  Location: [facility] ENDOSCOPY;  Service: Endoscopy;;  . RADIOACTIVE SEED GUIDED PARTIAL  MASTECTOMY WITH AXILLARY SENTINEL LYMPH NODE BIOPSY Right 08/18/2015   Procedure: RADIOACTIVE SEED GUIDED PARTIAL MASTECTOMY WITH AXILLARY SENTINEL LYMPH NODE BIOPSY;  Surgeon: Almond Lint, MD;  Location: [facility];  Service: General;  Laterality: Right;  . TOTAL ROBOTIC ASSISTED LAPAROSCOPIC HYSTERECTOMY  12-30-2010    SYMPTOMATIC UTERINE FIBROIDS  . UPPER TEETH EXTRACTION'S  1992  . VIDEO BRONCHOSCOPY WITH RADIAL ENDOBRONCHIAL ULTRASOUND  07/09/2023   Procedure: VIDEO BRONCHOSCOPY WITH RADIAL ENDOBRONCHIAL ULTRASOUND;  Surgeon: Leslye Peer, MD;  Location: [facility] ENDOSCOPY;  Service: Pulmonary;;    HEMATOLOGY/ONCOLOGY HISTORY:  Oncology History  Breast cancer of upper-outer quadrant of right female breast (HCC)  07/08/2015 Mammogram   Right breast calcifications 11 mm span   08/04/2015 Initial Diagnosis   Biopsy right breast: Invasive ductal carcinoma with DCIS with calcifications, lymphovascular invasion is identified, grade 2-3, ER 100%, PR 100% Ki-67 20%, HER-2 negative ratio 1.46   08/04/2015 Clinical Stage   Stage IA: T1b N0   08/18/2015 Definitive Surgery   Right  lumpectomy: invasive ductal carcinoma, grade 2, 0.6 cm, with DCIS high-grade, margins negative, 0/2 lymph nodes negative, ER 100%, PR positive, HER-2 negative, Ki-67 20%   08/18/2015 Pathologic Stage   Stage IA: T1b N0   08/18/2015 Oncotype testing   Insufficient tissue to perform   09/21/2015 - 10/20/2015 Radiation Therapy   Adjuvant radiation therapy: 1) Right Breast / 40.05 Gy in 15 fractions 2) Right breast boost / 10 Gy in 5 fractions   10/08/2015 - 10/11/2015 Hospital Admission   cellulitis of the right breast   11/16/2015 -  Anti-estrogen oral therapy   Anastrozole 1 mg daily once hot flashes better controlled. Planned duration of therapy 5 years.    Squamous cell lung cancer, right (HCC)  07/17/2023 Initial Diagnosis   Squamous cell lung cancer, right (HCC)   07/26/2023 Cancer Staging   Staging form: Lung, AJCC 8th Edition - Clinical: Stage IVB (cT3, cN0, cM1c) - Signed by Si Gaul, MD on 07/26/2023   10/04/2023 -  Chemotherapy   Patient is on Treatment Plan : LUNG NSCLC Carboplatin (6) + Paclitaxel (200) + Pembrolizumab (200) D1 q21d x 4 cycles / Pembrolizumab (200) Maintenance D1 q21d       ALLERGIES:  is allergic  to codeine.  MEDICATIONS:  Current Outpatient Medications  Medication Sig Dispense Refill  . acetaminophen (TYLENOL) 325 MG tablet Take 650 mg by mouth every 6 (six) hours as needed for mild pain or moderate pain.    Marland Kitchen albuterol (VENTOLIN HFA) 108 (90 Base) MCG/ACT inhaler INHALE 2 PUFFS EVERY 6 HOURS AS NEEDED FOR WHEEZING OR SHORTNESS OF BREATH (Patient taking differently: Inhale 2 puffs into the lungs every 6 (six) hours as needed for shortness of breath or wheezing.) 54 g 0  . aspirin 81 MG chewable tablet Chew 1 tablet (81 mg total) by mouth daily. 30 tablet 0  . atorvastatin (LIPITOR) 20 MG tablet TAKE 1 TABLET (20 MG TOTAL) BY MOUTH DAILY. 90 tablet 3  . dexamethasone (DECADRON) 4 MG tablet Take 1 tablet (4 mg total) by mouth 2 (two) times daily.    . fluticasone (FLONASE) 50 MCG/ACT nasal spray USE 2 SPRAYS IN BOTH  NOSTRILS DAILY AS NEEDED  FOR ALLERGIES (Patient taking differently: Place 2 sprays into both nostrils daily as needed for allergies.) 48 g 11  . HYDROcodone-acetaminophen (NORCO) 5-325 MG tablet Take 1 tablet by mouth every 6 (six) hours as needed for moderate pain (pain score 4-6). 10 tablet 0  .  levETIRAcetam (KEPPRA) 750 MG tablet Take 1 tablet (750 mg total) by mouth 2 (two) times daily. 60 tablet 0  . lidocaine-prilocaine (EMLA) cream Apply to affected area once (Patient taking differently: Apply 1 Application topically See admin instructions. Apply to affected area once as directed) 30 g 3  . Magnesium 400 MG CAPS Take 400 mg by mouth daily at 6 (six) AM. 30 capsule 0  . metoprolol tartrate (LOPRESSOR) 25 MG tablet Take 1 tablet (25 mg total) by mouth daily.    . montelukast (SINGULAIR) 10 MG tablet Take 10 mg by mouth at bedtime.    . Multiple Vitamin (MULTIVITAMIN) capsule Take 1 capsule by mouth daily with breakfast.    . omeprazole (PRILOSEC) 20 MG capsule TAKE 1 CAPSULE EVERY DAY (Patient taking differently: Take 20 mg by mouth daily before breakfast.) 90 capsule 1   . ondansetron (ZOFRAN) 4 MG tablet Take 1 tablet (4 mg total) by mouth every 6 (six) hours as needed for nausea. 20 tablet 0  . OXYGEN Inhale 2-3 L/min into the lungs See admin instructions. Inhale 2-3 L/min into the lungs CONTINUOUSLY; 2 L/min at rest and 3 L/min if exerted    . potassium chloride SA (KLOR-CON M) 20 MEQ tablet Take 1 tablet (20 mEq total) by mouth daily for 14 days. 14 tablet 0  . prochlorperazine (COMPAZINE) 10 MG tablet Take 1 tablet (10 mg total) by mouth every 6 (six) hours as needed for nausea or vomiting. 30 tablet 1  . sertraline (ZOLOFT) 50 MG tablet TAKE 1 TABLET EVERY DAY (Patient taking differently: Take 50 mg by mouth daily.) 90 tablet 1  . STIOLTO RESPIMAT 2.5-2.5 MCG/ACT AERS USE 2 INHALATIONS BY MOUTH DAILY 12 g 3   No current facility-administered medications for this visit.   Facility-Administered Medications Ordered in Other Visits  Medication Dose Route Frequency Provider Last Rate Last Admin  . acetaminophen (TYLENOL) tablet 650 mg  650 mg Oral Q6H PRN Steffanie Rainwater, MD       Or  . acetaminophen (TYLENOL) suppository 650 mg  650 mg Rectal Q6H PRN Steffanie Rainwater, MD      . albuterol (PROVENTIL) (2.5 MG/3ML) 0.083% nebulizer solution 3 mL  3 mL Inhalation Q6H PRN Steffanie Rainwater, MD      . arformoterol (BROVANA) nebulizer solution 15 mcg  15 mcg Nebulization BID Steffanie Rainwater, MD   15 mcg at 10/19/23 0804   And  . umeclidinium bromide (INCRUSE ELLIPTA) 62.5 MCG/ACT 1 puff  1 puff Inhalation Daily Steffanie Rainwater, MD   1 puff at 10/19/23 0804  . atorvastatin (LIPITOR) tablet 20 mg  20 mg Oral Daily Steffanie Rainwater, MD   20 mg at 10/19/23 0940  . Chlorhexidine Gluconate Cloth 2 % PADS 6 each  6 each Topical Daily Almon Hercules, MD   6 each at 10/19/23 0941  . dexamethasone (DECADRON) tablet 4 mg  4 mg Oral BID Dorcas Carrow, MD   4 mg at 10/19/23 0940  . hydrALAZINE (APRESOLINE) tablet 25 mg  25 mg Oral Q6H PRN Candelaria Stagers T, MD       . levETIRAcetam (KEPPRA) tablet 750 mg  750 mg Oral BID Steffanie Rainwater, MD   750 mg at 10/19/23 0939  . loperamide (IMODIUM) capsule 2 mg  2 mg Oral PRN Candelaria Stagers T, MD   2 mg at 10/13/23 1510  . metoprolol tartrate (LOPRESSOR) tablet 25 mg  25 mg Oral BID Candelaria Stagers  T, MD   25 mg at 10/19/23 0940  . montelukast (SINGULAIR) tablet 10 mg  10 mg Oral QHS Steffanie Rainwater, MD   10 mg at 10/18/23 2055  . multivitamin with minerals tablet 1 tablet  1 tablet Oral Q breakfast Steffanie Rainwater, MD   1 tablet at 10/19/23 0940  . ondansetron (ZOFRAN) tablet 4 mg  4 mg Oral Q6H PRN Steffanie Rainwater, MD       Or  . ondansetron [facility]) injection 4 mg  4 mg Intravenous Q6H PRN Steffanie Rainwater, MD   4 mg at 10/19/23 0310  . oxyCODONE-acetaminophen (PERCOCET/ROXICET) 5-325 MG per tablet 1 tablet  1 tablet Oral Q6H PRN Steffanie Rainwater, MD   1 tablet at 10/17/23 2235  . pantoprazole (PROTONIX) EC tablet 40 mg  40 mg Oral Daily Steffanie Rainwater, MD   40 mg at 10/19/23 0940  . potassium chloride SA (KLOR-CON M) CR tablet 40 mEq  40 mEq Oral BID Dorcas Carrow, MD   40 mEq at 10/19/23 0940  . senna-docusate (Senokot-S) tablet 1 tablet  1 tablet Oral QHS PRN Steffanie Rainwater, MD      . sertraline (ZOLOFT) tablet 50 mg  50 mg Oral Daily Steffanie Rainwater, MD   50 mg at 10/19/23 0940  . sodium chloride flush (NS) 0.9 % injection 10 mL  10 mL Intravenous Q12H Steffanie Rainwater, MD   10 mL at 10/19/23 0940  . sodium chloride flush (NS) 0.9 % injection 10-40 mL  10-40 mL Intracatheter Q12H Candelaria Stagers T, MD   10 mL at 10/19/23 0941    VITAL SIGNS: There were no vitals taken for this visit. There were no vitals filed for this visit.  Estimated body mass index is 26.46 kg/m as calculated from the following:   Height as of 10/12/23: 5\' 5"  (1.651 m).   Weight as of 10/13/23: 159 lb (72.1 kg).  LABS: CBC:    Component Value Date/Time   WBC 22.0 (H) 10/19/2023 0252   HGB 7.5 (L)  10/19/2023 0252   HGB 9.5 (L) 10/02/2023 1033   HCT 23.6 (L) 10/19/2023 0252   HCT 29.8 (L) 01/10/2019 1219   PLT 44 (L) 10/19/2023 0252   PLT 189 10/02/2023 1033   MCV 86.1 10/19/2023 0252   NEUTROABS 16.8 (H) 10/19/2023 0252   LYMPHSABS 0.5 (L) 10/19/2023 0252   MONOABS 1.1 (H) 10/19/2023 0252   EOSABS 0.0 10/19/2023 0252   BASOSABS 0.1 10/19/2023 0252   Comprehensive Metabolic Panel:    Component Value Date/Time   NA 142 10/19/2023 0252   NA 146 (H) 01/10/2019 1220   K 4.0 10/19/2023 0252   CL 108 10/19/2023 0252   CO2 28 10/19/2023 0252   BUN 7 (L) 10/19/2023 0252   BUN 21 01/10/2019 1220   CREATININE 0.85 10/19/2023 0252   CREATININE 0.64 10/02/2023 1033   CREATININE 0.64 02/04/2015 1619   GLUCOSE 102 (H) 10/19/2023 0252   CALCIUM 8.3 (L) 10/19/2023 0252   AST 20 10/19/2023 0252   AST 16 10/02/2023 1033   ALT 21 10/19/2023 0252   ALT 19 10/02/2023 1033   ALKPHOS 74 10/19/2023 0252   BILITOT 0.6 10/19/2023 0252   BILITOT 0.5 10/02/2023 1033   PROT 4.5 (L) 10/19/2023 0252   PROT 7.7 02/05/2017 1530   ALBUMIN 2.2 (L) 10/19/2023 0252   ALBUMIN 4.4 02/05/2017 1530    RADIOGRAPHIC STUDIES: CT CHEST W CONTRAST  Result Date: 09/19/2023 CLINICAL  DATA:  Non-small cell lung cancer staging; * Tracking Code: BO * EXAM: CT CHEST WITH CONTRAST TECHNIQUE: Multidetector CT imaging of the chest was performed during intravenous contrast administration. RADIATION DOSE REDUCTION: This exam was performed according to the departmental dose-optimization program which includes automated exposure control, adjustment of the mA and/or kV according to patient size and/or use of iterative reconstruction technique. CONTRAST:  50mL OMNIPAQUE IOHEXOL 350 MG/ML SOLN COMPARISON:  PET-CT dated August 11, 2023; chest CT dated June 26, 2023 FINDINGS: Cardiovascular: Normal heart size. No pericardial effusion. Normal caliber thoracic aorta with moderate atherosclerotic disease. Mediastinum/Nodes:  Esophagus and thyroid are unremarkable. No enlarged lymph nodes seen in the chest. Lungs/Pleura: Right upper lobe mass is decreased in size with new areas of cavitation measuring 4.8 x 3.6 cm, previously 6.3 x 5.1 cm. Stable irregular linear opacities of the bilateral lower lobes, likely due to scarring. New small right pleural effusion. Severe emphysema. Upper Abdomen: Stable cystic lesion of the anterior right kidney. No acute abnormality. Musculoskeletal: No chest wall abnormality. Unchanged diffuse sclerosis of the T3 vertebral body. IMPRESSION: 1. Right upper lobe mass is decreased in size with new areas of cavitation. 2. New small right pleural effusion. 3. Unchanged diffuse sclerosis of the T3 vertebral body. No evidence of progressive metastatic disease. 4. Aortic Atherosclerosis (ICD10-I70.0) and Emphysema (ICD10-J43.9). Electronically Signed   By: Allegra Lai M.D.   On: 09/19/2023 11:11  MR BRAIN W WO CONTRAST  Result Date: 09/17/2023 CLINICAL DATA:  Altered mental status and seizure. Brain metastases. EXAM: MRI HEAD WITHOUT AND WITH CONTRAST TECHNIQUE: Multiplanar, multiecho pulse sequences of the brain and surrounding structures were obtained without and with intravenous contrast. CONTRAST:  7mL GADAVIST GADOBUTROL 1 MMOL/ML IV SOLN COMPARISON:  08/29/2023 FINDINGS: Brain: There is abnormal diffusion restriction within the medial left PCA territory, including the dorsomedial left thalamus. There is also diffusion restriction of the left insula. There is vasogenic edema within the anterior left temporal lobe and left occipital lobe, near the regions of diffusion restriction. There contrast-enhancing masses at both locations. The left temporal mass measures 10 mm, unchanged (series 19, image 27). Left occipital mass measures 10 mm, unchanged (image 23). Lesion of the right postcentral gyrus measures 7 mm, previously 9 mm (image 48). There is a focus of likely extra-axial contrast enhancement at the  left cerebellopontine angle that measures 7 mm, previously 3 mm (series 19, image 18). The midline structures are normal. Vascular: Normal flow voids. Skull and upper cervical spine: Normal calvarium and skull base. Visualized upper cervical spine and soft tissues are normal. Sinuses/Orbits:No paranasal sinus fluid levels or advanced mucosal thickening. No mastoid or middle ear effusion. Normal orbits. IMPRESSION: 1. Unchanged size of contrast-enhancing masses of the left temporal lobe and left occipital lobe with progression of surrounding vasogenic edema. 2. Areas of diffusion restriction of the left insula and left PCA territory, near the above described lesions, may be a postictal phenomenon. 3. Slight decrease in size of the right postcentral gyrus metastasis. 4. Increased size of a 7 mm extra-axial contrast-enhancing mass at the left cerebellopontine angle, which could be a meningioma or extra-axial metastasis. Electronically Signed   By: Deatra Robinson M.D.   On: 09/17/2023 19:51   PERFORMANCE STATUS (ECOG) : {CHL ONC ECOG UY:[id number]}  Review of Systems Unless otherwise noted, a complete review of systems is negative.  Physical Exam General: NAD Cardiovascular: regular rate and rhythm Pulmonary: clear ant fields Abdomen: soft, nontender, + bowel sounds Extremities: no edema, no  joint deformities Skin: no rashes Neurological: Alert and oriented x3  IMPRESSION: *** I introduced myself, Maygan RN, and Palliative's role in collaboration with the oncology team. Concept of Palliative Care was introduced as specialized medical care for people and their families living with serious illness.  It focuses on providing relief from the symptoms and stress of a serious illness.  The goal is to improve quality of life for both the patient and the family. Values and goals of care important to patient and family were attempted to be elicited.    We discussed *** current illness and what it means in the  larger context of *** on-going co-morbidities. Natural disease trajectory and expectations were discussed.  I discussed the importance of continued conversation with family and their medical providers regarding overall plan of care and treatment options, ensuring decisions are within the context of the patients values and GOCs.  PLAN: Established therapeutic relationship. Education provided on palliative's role in collaboration with their Oncology/Radiation team. I will plan to see patient back in 2-4 weeks in collaboration to other oncology appointments.    Patient expressed understanding and was in agreement with this plan. She also understands that She can call the clinic at any time with any questions, concerns, or complaints.   Thank you for your referral and allowing Palliative to assist in Mrs. Louie Boston Unangst's care.   Number and complexity of problems addressed: ***HIGH - 1 or more chronic illnesses with SEVERE exacerbation, progression, or side effects of treatment - advanced cancer, pain. Any controlled substances utilized were prescribed in the context of palliative care.   Visit consisted of counseling and education dealing with the complex and emotionally intense issues of symptom management and palliative care in the setting of serious and potentially life-threatening illness.  Signed by: Willette Alma, AGPCNP-BC Palliative Medicine Team/[facility]   *Please note that this is a verbal dictation therefore any spelling or grammatical errors are due to the "[employer] One" system interpretation.

## 2023-10-19 NOTE — ED Triage Notes (Addendum)
Pt BIB EMS from home. Reports pt stood from toilet and fell, hit top of back on bathtub. Pt denies hitting head. A&Ox4  No thinners.   EMS VS 136/96, HR 74, 98% 2L 02 (baseline)

## 2023-10-19 NOTE — Care Management Important Message (Signed)
Important Message  Patient Details  Name: [name] MRN: [id number] Date of Birth: 10-29-1961   Important Message Given:  Yes - Medicare IM     Dorena Bodo 10/19/2023, 3:12 PM

## 2023-10-20 ENCOUNTER — Emergency Department (HOSPITAL_COMMUNITY): Payer: 59

## 2023-10-20 MED ORDER — KETOROLAC TROMETHAMINE 60 MG/2ML IM SOLN
30.0000 mg | Freq: Once | INTRAMUSCULAR | Status: AC
  Administered 2023-10-20: 30 mg via INTRAMUSCULAR
  Filled 2023-10-20: qty 2

## 2023-10-20 NOTE — ED Provider Notes (Signed)
[place] EMERGENCY DEPARTMENT AT [facility] Provider Note   CSN: [id number] Arrival date & time: 10/19/23  2253     History  Chief Complaint  Patient presents with   Marletta Lor    [name] is a 62 y.o. female.  The history is provided by the patient.  Fall This is a new problem. The problem occurs rarely. The problem has been resolved. Pertinent negatives include no chest pain, no abdominal pain, no headaches and no shortness of breath. Nothing aggravates the symptoms. Nothing relieves the symptoms. She has tried nothing for the symptoms. The treatment provided no relief.  Patient with cancer presents falling backwards and striking her low back. Did hit head on bath tub.  BIB EMS for evaluation.      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 Authorizing Provider  acetaminophen (TYLENOL) 325 MG tablet Take 650 mg by mouth every 6 (six) hours as needed for mild pain or moderate pain.    [provider]  albuterol (VENTOLIN HFA) 108 (90 Base) MCG/ACT inhaler INHALE 2 PUFFS EVERY 6 HOURS AS NEEDED FOR WHEEZING OR SHORTNESS OF BREATH Patient taking differently: Inhale 2 puffs into the lungs every 6 (six) hours as needed for shortness of breath or wheezing. 09/23/19   Nyoka Cowden, MD  aspirin 81 MG chewable tablet Chew 1  tablet (81 mg total) by mouth daily. 09/22/23   Swayze, Ava, DO  atorvastatin (LIPITOR) 20 MG tablet TAKE 1 TABLET (20 MG TOTAL) BY MOUTH DAILY. 03/28/21   Sandre Kitty, MD  dexamethasone (DECADRON) 4 MG tablet Take 1 tablet (4 mg total) by mouth 2 (two) times daily. 10/19/23   Dorcas Carrow, MD  fluticasone (FLONASE) 50 MCG/ACT nasal spray USE 2 SPRAYS IN BOTH  NOSTRILS DAILY AS NEEDED  FOR ALLERGIES Patient taking differently: Place 2 sprays into both nostrils daily as needed for allergies. 08/15/21   Nyoka Cowden, MD  HYDROcodone-acetaminophen (NORCO) 5-325 MG tablet Take 1 tablet by mouth every 6 (six) hours as needed for moderate pain (pain score 4-6). 10/19/23   Heilingoetter, Cassandra L, PA-C  levETIRAcetam (KEPPRA) 750 MG tablet Take 1 tablet (750 mg total) by mouth 2 (two) times daily. 09/21/23   Swayze, Ava, DO  lidocaine-prilocaine (EMLA) cream Apply to affected area once Patient taking differently: Apply 1 Application topically See admin instructions. Apply to affected area once as directed 08/01/23   Si Gaul, MD  Magnesium 400 MG CAPS Take 400 mg by mouth daily at 6 (six) AM. 10/19/23 11/18/23  Dorcas Carrow, MD  metoprolol tartrate (LOPRESSOR) 25 MG tablet Take 1 tablet (25 mg total) by mouth daily. 07/09/23   Leslye Peer, MD  montelukast (SINGULAIR) 10 MG tablet  Take 10 mg by mouth at bedtime. 07/31/22   [provider]  Multiple Vitamin (MULTIVITAMIN) capsule Take 1 capsule by mouth daily with breakfast.    [provider]  omeprazole (PRILOSEC) 20 MG capsule TAKE 1 CAPSULE EVERY DAY Patient taking differently: Take 20 mg by mouth daily before breakfast. 01/03/21   Sandre Kitty, MD  ondansetron (ZOFRAN) 4 MG tablet Take 1 tablet (4 mg total) by mouth every 6 (six) hours as needed for nausea. 06/28/23   Alwyn Ren, MD  OXYGEN Inhale 2-3 L/min into the lungs See admin instructions. Inhale 2-3 L/min into the lungs CONTINUOUSLY; 2 L/min at rest and 3  L/min if exerted    [provider]  potassium chloride SA (KLOR-CON M) 20 MEQ tablet Take 1 tablet (20 mEq total) by mouth daily for 14 days. 10/19/23 11/02/23  Dorcas Carrow, MD  prochlorperazine (COMPAZINE) 10 MG tablet Take 1 tablet (10 mg total) by mouth every 6 (six) hours as needed for nausea or vomiting. 08/01/23   Si Gaul, MD  sertraline (ZOLOFT) 50 MG tablet TAKE 1 TABLET EVERY DAY Patient taking differently: Take 50 mg by mouth daily. 09/11/20   Sandre Kitty, MD  STIOLTO RESPIMAT 2.5-2.5 MCG/ACT AERS USE 2 INHALATIONS BY MOUTH DAILY 09/17/23   Nyoka Cowden, MD      Allergies    Codeine    Review of Systems   Review of Systems  Constitutional:  Negative for fever.  HENT:  Negative for facial swelling.   Respiratory:  Negative for shortness of breath.   Cardiovascular:  Negative for chest pain.  Gastrointestinal:  Negative for abdominal pain.  Neurological:  Negative for seizures, facial asymmetry, speech difficulty, weakness and headaches.  All other systems reviewed and are negative.   Physical Exam Updated Vital Signs BP (!) 158/81   Pulse 78   Temp 98 F (36.7 C) (Oral)   Resp 18   Ht 5\' 5"  (1.651 m)   Wt 72.6 kg   SpO2 100%   BMI 26.63 kg/m  Physical Exam Vitals and nursing note reviewed.  Constitutional:      General: She is not in acute distress.    Appearance: Normal appearance. She is well-developed.  HENT:     Head: Normocephalic and atraumatic.     Nose: Nose normal.     Mouth/Throat:     Mouth: Mucous membranes are moist.     Pharynx: Oropharynx is clear.  Eyes:     Pupils: Pupils are equal, round, and reactive to light.  Cardiovascular:     Rate and Rhythm: Normal rate and regular rhythm.     Pulses: Normal pulses.     Heart sounds: Normal heart sounds.  Pulmonary:     Effort: Pulmonary effort is normal. No respiratory distress.     Breath sounds: Normal breath sounds.  Abdominal:     General: Bowel sounds are normal.  There is no distension.     Palpations: Abdomen is soft.     Tenderness: There is no abdominal tenderness. There is no guarding or rebound.  Musculoskeletal:        General: Normal range of motion.     Cervical back: Neck supple.  Skin:    General: Skin is warm and dry.     Capillary Refill: Capillary refill takes less than 2 seconds.     Findings: No erythema or rash.  Neurological:     General: No focal deficit present.  Mental Status: She is alert.     Deep Tendon Reflexes: Reflexes normal.  Psychiatric:        Mood and Affect: Mood normal.     ED Results / Procedures / Treatments   Labs (all labs ordered are listed, but only abnormal results are displayed) Labs Reviewed - No data to display  EKG None  Radiology DG Lumbar Spine Complete  Result Date: 10/20/2023 CLINICAL DATA:  Fall, low back pain EXAM: LUMBAR SPINE - COMPLETE 4+ VIEW COMPARISON:  02/18/2020, CT 10/13/2023 FINDINGS: Normal alignment. No fracture. Early degenerative changes in the lower lumbar spine with slight disc space narrowing and early spurring. Vascular calcifications. No visible aneurysm. IMPRESSION: No acute bony abnormality. Electronically Signed   By: Charlett Nose M.D.   On: 10/20/2023 03:34   CT Cervical Spine Wo Contrast  Result Date: 10/20/2023 CLINICAL DATA:  Polytrauma, blunt.  Fall. EXAM: CT CERVICAL SPINE WITHOUT CONTRAST TECHNIQUE: Multidetector CT imaging of the cervical spine was performed without intravenous contrast. Multiplanar CT image reconstructions were also generated. RADIATION DOSE REDUCTION: This exam was performed according to the departmental dose-optimization program which includes automated exposure control, adjustment of the mA and/or kV according to patient size and/or use of iterative reconstruction technique. COMPARISON:  None Available. FINDINGS: Alignment: Normal Skull base and vertebrae: No acute fracture. No primary bone lesion or focal pathologic process. Soft tissues  and spinal canal: No prevertebral fluid or swelling. No visible canal hematoma. Disc levels: Mild degenerative disc disease in the lower cervical spine. No disc herniation. Upper chest: Again partially visualized is the right upper lobe mass and layering large right pleural effusion. Other: None IMPRESSION: No acute bony abnormality. Known right apical mass and large layering right pleural effusion. Electronically Signed   By: Charlett Nose M.D.   On: 10/20/2023 02:08   CT Head Wo Contrast  Result Date: 10/20/2023 CLINICAL DATA:  Polytrauma, blunt.  Fall. EXAM: CT HEAD WITHOUT CONTRAST TECHNIQUE: Contiguous axial images were obtained from the base of the skull through the vertex without intravenous contrast. RADIATION DOSE REDUCTION: This exam was performed according to the departmental dose-optimization program which includes automated exposure control, adjustment of the mA and/or kV according to patient size and/or use of iterative reconstruction technique. COMPARISON:  None Available. FINDINGS: Brain: Again noted are areas of edema within the left temporal lobe and left occipital lobe in areas of prior known metastases. No areas of hemorrhage. No hydrocephalus. No midline shift., Improved since prior study Vascular: No hyperdense vessel or unexpected calcification. Skull: No acute calvarial abnormality. 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 No acute findings. Electronically Signed   By: Charlett Nose M.D.   On: 10/20/2023 02:05   DG HIP UNILAT WITH PELVIS 2-3 VIEWS RIGHT  Result Date: 10/18/2023 CLINICAL DATA:  Fall EXAM: DG HIP (WITH OR WITHOUT PELVIS) 2-3V RIGHT COMPARISON:  CT 10/13/2023 FINDINGS: No fracture or malalignment.  Moderate hip arthritis. IMPRESSION: No acute osseous abnormality. Electronically Signed   By: Jasmine Pang M.D.   On: 10/18/2023 23:45   DG HIP UNILAT WITH PELVIS  2-3 VIEWS LEFT  Result Date: 10/18/2023 CLINICAL DATA:  Fall EXAM: DG HIP (WITH OR WITHOUT PELVIS) 2-3V LEFT COMPARISON:  CT 10/13/2023 FINDINGS: SI joints are non widened. Pubic symphysis and rami are intact. No acute fracture or malalignment. IMPRESSION: No acute osseous abnormality Electronically Signed   By: Adrian Prows.D.  On: 10/18/2023 23:44    Procedures Procedures    Medications Ordered in ED Medications - No data to display  ED Course/ Medical Decision Making/ A&P                                 Medical Decision Making Patient who fell backwards striking head on bath tub   Amount and/or Complexity of Data Reviewed Independent Historian: EMS    Details: See above  External Data Reviewed: notes.    Details: Previous notes reviewed  Radiology: ordered and independent interpretation performed.    Details: Negative L spine and Head CT by me   Risk Risk Details: Well appearing.  Feeling well.  No acute injuries from fall.  Stable for discharge with close follow up.      Final Clinical Impression(s) / ED Diagnoses Final diagnoses:  Fall, initial encounter   Return for intractable cough, coughing up blood, fevers > 100.4 unrelieved by medication, shortness of breath, intractable vomiting, chest pain, shortness of breath, weakness, numbness, changes in speech, facial asymmetry, abdominal pain, passing out, Inability to tolerate liquids or food, cough, altered mental status or any concerns. No signs of systemic illness or infection. The patient is nontoxic-appearing on exam and vital signs are within normal limits.  I have reviewed the triage vital signs and the nursing notes. Pertinent labs & imaging results that were available during my care of the patient were reviewed by me and considered in my medical decision making (see chart for details). After history, exam, and medical workup I feel the patient has been appropriately medically screened and is safe for discharge  home. Pertinent diagnoses were discussed with the patient. Patient was given return precautions.    Rx / DC Orders ED Discharge Orders     None         Cong Hightower, MD 10/20/23 [phone]

## 2023-10-20 NOTE — ED Notes (Signed)
Patient transported to CT 

## 2023-10-20 NOTE — ED Notes (Signed)
 [facility] CALLED, NO ETA

## 2023-10-20 NOTE — ED Notes (Signed)
Patient transported to X-ray 

## 2023-10-20 NOTE — ED Notes (Signed)
Pt elects to return home with her daughter pov, although EMS did not bring her 02 tank with her, pt educated by this RN and expressed understanding and states "I live close by." Pt offered [facility] services, but declines. Pt wheel chaired by NT to lobby on [address]L [address] [place] as her ride arrives.

## 2023-10-20 NOTE — ED Notes (Signed)
[facility] CANCELLED

## 2023-10-22 ENCOUNTER — Encounter: Payer: Self-pay | Admitting: Nurse Practitioner

## 2023-10-22 ENCOUNTER — Inpatient Hospital Stay: Payer: 59

## 2023-10-22 ENCOUNTER — Inpatient Hospital Stay: Payer: 59 | Attending: Internal Medicine | Admitting: Nurse Practitioner

## 2023-10-22 ENCOUNTER — Telehealth: Payer: Self-pay | Admitting: Medical Oncology

## 2023-10-22 ENCOUNTER — Other Ambulatory Visit: Payer: Self-pay | Admitting: Medical Oncology

## 2023-10-22 VITALS — BP 154/84 | HR 86 | Temp 97.9°F | Resp 15 | Wt 169.5 lb

## 2023-10-22 DIAGNOSIS — Z170 Estrogen receptor positive status [ER+]: Secondary | ICD-10-CM | POA: Insufficient documentation

## 2023-10-22 DIAGNOSIS — C7951 Secondary malignant neoplasm of bone: Secondary | ICD-10-CM | POA: Diagnosis not present

## 2023-10-22 DIAGNOSIS — Z7982 Long term (current) use of aspirin: Secondary | ICD-10-CM | POA: Diagnosis not present

## 2023-10-22 DIAGNOSIS — Z87891 Personal history of nicotine dependence: Secondary | ICD-10-CM | POA: Insufficient documentation

## 2023-10-22 DIAGNOSIS — Z79811 Long term (current) use of aromatase inhibitors: Secondary | ICD-10-CM | POA: Diagnosis not present

## 2023-10-22 DIAGNOSIS — G893 Neoplasm related pain (acute) (chronic): Secondary | ICD-10-CM | POA: Diagnosis not present

## 2023-10-22 DIAGNOSIS — C7931 Secondary malignant neoplasm of brain: Secondary | ICD-10-CM | POA: Insufficient documentation

## 2023-10-22 DIAGNOSIS — Z5111 Encounter for antineoplastic chemotherapy: Secondary | ICD-10-CM | POA: Diagnosis present

## 2023-10-22 DIAGNOSIS — R530 Neoplastic (malignant) related fatigue: Secondary | ICD-10-CM | POA: Diagnosis not present

## 2023-10-22 DIAGNOSIS — Z95828 Presence of other vascular implants and grafts: Secondary | ICD-10-CM

## 2023-10-22 DIAGNOSIS — C3490 Malignant neoplasm of unspecified part of unspecified bronchus or lung: Secondary | ICD-10-CM

## 2023-10-22 DIAGNOSIS — Z923 Personal history of irradiation: Secondary | ICD-10-CM | POA: Diagnosis not present

## 2023-10-22 DIAGNOSIS — Z79899 Other long term (current) drug therapy: Secondary | ICD-10-CM | POA: Diagnosis not present

## 2023-10-22 DIAGNOSIS — Z515 Encounter for palliative care: Secondary | ICD-10-CM | POA: Diagnosis not present

## 2023-10-22 DIAGNOSIS — D649 Anemia, unspecified: Secondary | ICD-10-CM

## 2023-10-22 DIAGNOSIS — C787 Secondary malignant neoplasm of liver and intrahepatic bile duct: Secondary | ICD-10-CM | POA: Insufficient documentation

## 2023-10-22 DIAGNOSIS — Z5112 Encounter for antineoplastic immunotherapy: Secondary | ICD-10-CM | POA: Diagnosis present

## 2023-10-22 DIAGNOSIS — C3411 Malignant neoplasm of upper lobe, right bronchus or lung: Secondary | ICD-10-CM | POA: Diagnosis present

## 2023-10-22 DIAGNOSIS — C50411 Malignant neoplasm of upper-outer quadrant of right female breast: Secondary | ICD-10-CM | POA: Insufficient documentation

## 2023-10-22 DIAGNOSIS — Z7952 Long term (current) use of systemic steroids: Secondary | ICD-10-CM | POA: Insufficient documentation

## 2023-10-22 DIAGNOSIS — C3491 Malignant neoplasm of unspecified part of right bronchus or lung: Secondary | ICD-10-CM

## 2023-10-22 LAB — CMP (CANCER CENTER ONLY)
ALT: 16 U/L (ref 0–44)
AST: 16 U/L (ref 15–41)
Albumin: 3 g/dL — ABNORMAL LOW (ref 3.5–5.0)
Alkaline Phosphatase: 96 U/L (ref 38–126)
Anion gap: 3 — ABNORMAL LOW (ref 5–15)
BUN: 8 mg/dL (ref 8–23)
CO2: 35 mmol/L — ABNORMAL HIGH (ref 22–32)
Calcium: 8.4 mg/dL — ABNORMAL LOW (ref 8.9–10.3)
Chloride: 105 mmol/L (ref 98–111)
Creatinine: 0.77 mg/dL (ref 0.44–1.00)
GFR, Estimated: >60 mL/min (ref >60)
Glucose, Bld: 136 mg/dL — ABNORMAL HIGH (ref 70–99)
Potassium: 4.2 mmol/L (ref 3.5–5.1)
Sodium: 143 mmol/L (ref 135–145)
Total Bilirubin: 0.4 mg/dL (ref <1.2)
Total Protein: 5.3 g/dL — ABNORMAL LOW (ref 6.5–8.1)

## 2023-10-22 LAB — CBC WITH DIFFERENTIAL (CANCER CENTER ONLY)
Abs Immature Granulocytes: 4.34 10*3/uL — ABNORMAL HIGH (ref 0.00–0.07)
Basophils Absolute: 0.1 10*3/uL (ref 0.0–0.1)
Basophils Relative: 0 %
Eosinophils Absolute: 0 10*3/uL (ref 0.0–0.5)
Eosinophils Relative: 0 %
HCT: 24.5 % — ABNORMAL LOW (ref 36.0–46.0)
Hemoglobin: 7.8 g/dL — ABNORMAL LOW (ref 12.0–15.0)
Immature Granulocytes: 18 %
Lymphocytes Relative: 2 %
Lymphs Abs: 0.4 10*3/uL — ABNORMAL LOW (ref 0.7–4.0)
MCH: 27.7 pg (ref 26.0–34.0)
MCHC: 31.8 g/dL (ref 30.0–36.0)
MCV: 86.9 fL (ref 80.0–100.0)
Monocytes Absolute: 1 10*3/uL (ref 0.1–1.0)
Monocytes Relative: 4 %
Neutro Abs: 19 10*3/uL — ABNORMAL HIGH (ref 1.7–7.7)
Neutrophils Relative %: 76 %
Platelet Count: 99 10*3/uL — ABNORMAL LOW (ref 150–400)
RBC: 2.82 MIL/uL — ABNORMAL LOW (ref 3.87–5.11)
RDW: 17.9 % — ABNORMAL HIGH (ref 11.5–15.5)
Smear Review: DECREASED
WBC Count: 24.9 10*3/uL — ABNORMAL HIGH (ref 4.0–10.5)
nRBC: 0.1 % (ref 0.0–0.2)

## 2023-10-22 LAB — SAMPLE TO BLOOD BANK

## 2023-10-22 LAB — SURGICAL PATHOLOGY

## 2023-10-22 MED ORDER — SODIUM CHLORIDE 0.9% FLUSH
10.0000 mL | Freq: Once | INTRAVENOUS | Status: AC
  Administered 2023-10-22: 10 mL

## 2023-10-22 MED ORDER — HEPARIN SOD (PORK) LOCK FLUSH 100 UNIT/ML IV SOLN
500.0000 [IU] | Freq: Once | INTRAVENOUS | Status: AC
  Administered 2023-10-22: 500 [IU]

## 2023-10-22 MED ORDER — HYDROCODONE-ACETAMINOPHEN 5-325 MG PO TABS: 1.0000 | ORAL_TABLET | Freq: Four times a day (QID) | ORAL | 0 refills | Status: DC | PRN

## 2023-10-22 NOTE — Telephone Encounter (Signed)
LVM on [name] and her son's phone to return my call re hgb 7.8

## 2023-10-24 ENCOUNTER — Ambulatory Visit (HOSPITAL_COMMUNITY): Payer: 59 | Attending: Urology

## 2023-10-24 ENCOUNTER — Other Ambulatory Visit: Payer: Self-pay | Admitting: Radiation Therapy

## 2023-10-24 ENCOUNTER — Telehealth: Payer: Self-pay

## 2023-10-24 ENCOUNTER — Encounter (HOSPITAL_COMMUNITY): Payer: Self-pay

## 2023-10-24 ENCOUNTER — Other Ambulatory Visit (HOSPITAL_COMMUNITY): Payer: 59

## 2023-10-24 MED FILL — Fosaprepitant Dimeglumine For IV Infusion 150 MG (Base Eq): INTRAVENOUS | Qty: 5 | Status: AC

## 2023-10-24 NOTE — Telephone Encounter (Signed)
Tried to reach patient to see how she was doing in regards to hgb 7.8.  Also, noticed that she has a MRI scan today and has not shown up yet.  Asked for a return call.

## 2023-10-25 ENCOUNTER — Inpatient Hospital Stay: Payer: 59

## 2023-10-25 ENCOUNTER — Encounter: Payer: Self-pay | Admitting: Internal Medicine

## 2023-10-25 ENCOUNTER — Encounter: Payer: Self-pay | Admitting: Hematology and Oncology

## 2023-10-25 ENCOUNTER — Inpatient Hospital Stay: Payer: 59 | Admitting: Internal Medicine

## 2023-10-25 ENCOUNTER — Other Ambulatory Visit: Payer: Self-pay | Admitting: Medical Oncology

## 2023-10-25 VITALS — BP 154/80 | HR 80 | Temp 97.6°F | Resp 16 | Ht 65.0 in | Wt 163.4 lb

## 2023-10-25 DIAGNOSIS — C3491 Malignant neoplasm of unspecified part of right bronchus or lung: Secondary | ICD-10-CM

## 2023-10-25 DIAGNOSIS — Z95828 Presence of other vascular implants and grafts: Secondary | ICD-10-CM

## 2023-10-25 DIAGNOSIS — D649 Anemia, unspecified: Secondary | ICD-10-CM

## 2023-10-25 DIAGNOSIS — Z5112 Encounter for antineoplastic immunotherapy: Secondary | ICD-10-CM | POA: Diagnosis not present

## 2023-10-25 LAB — SAMPLE TO BLOOD BANK

## 2023-10-25 LAB — CBC WITH DIFFERENTIAL (CANCER CENTER ONLY)
Abs Immature Granulocytes: 2.45 10*3/uL — ABNORMAL HIGH (ref 0.00–0.07)
Basophils Absolute: 0.2 10*3/uL — ABNORMAL HIGH (ref 0.0–0.1)
Basophils Relative: 1 %
Eosinophils Absolute: 0 10*3/uL (ref 0.0–0.5)
Eosinophils Relative: 0 %
HCT: 25.3 % — ABNORMAL LOW (ref 36.0–46.0)
Hemoglobin: 8.2 g/dL — ABNORMAL LOW (ref 12.0–15.0)
Immature Granulocytes: 11 %
Lymphocytes Relative: 2 %
Lymphs Abs: 0.5 10*3/uL — ABNORMAL LOW (ref 0.7–4.0)
MCH: 28.3 pg (ref 26.0–34.0)
MCHC: 32.4 g/dL (ref 30.0–36.0)
MCV: 87.2 fL (ref 80.0–100.0)
Monocytes Absolute: 1.3 10*3/uL — ABNORMAL HIGH (ref 0.1–1.0)
Monocytes Relative: 6 %
Neutro Abs: 17.2 10*3/uL — ABNORMAL HIGH (ref 1.7–7.7)
Neutrophils Relative %: 80 %
Platelet Count: 199 10*3/uL (ref 150–400)
RBC: 2.9 MIL/uL — ABNORMAL LOW (ref 3.87–5.11)
RDW: 18.3 % — ABNORMAL HIGH (ref 11.5–15.5)
WBC Count: 21.7 10*3/uL — ABNORMAL HIGH (ref 4.0–10.5)
nRBC: 0.4 % — ABNORMAL HIGH (ref 0.0–0.2)

## 2023-10-25 LAB — CMP (CANCER CENTER ONLY)
ALT: 16 U/L (ref 0–44)
AST: 19 U/L (ref 15–41)
Albumin: 3.2 g/dL — ABNORMAL LOW (ref 3.5–5.0)
Alkaline Phosphatase: 99 U/L (ref 38–126)
Anion gap: 3 — ABNORMAL LOW (ref 5–15)
BUN: 8 mg/dL (ref 8–23)
CO2: 40 mmol/L — ABNORMAL HIGH (ref 22–32)
Calcium: 8.6 mg/dL — ABNORMAL LOW (ref 8.9–10.3)
Chloride: 101 mmol/L (ref 98–111)
Creatinine: 0.73 mg/dL (ref 0.44–1.00)
GFR, Estimated: >60 mL/min (ref >60)
Glucose, Bld: 86 mg/dL (ref 70–99)
Potassium: 3.9 mmol/L (ref 3.5–5.1)
Sodium: 144 mmol/L (ref 135–145)
Total Bilirubin: 0.6 mg/dL (ref <1.2)
Total Protein: 5.6 g/dL — ABNORMAL LOW (ref 6.5–8.1)

## 2023-10-25 MED ORDER — SODIUM CHLORIDE 0.9 % IV SOLN
150.0000 mg | Freq: Once | INTRAVENOUS | Status: AC
  Administered 2023-10-25: 150 mg via INTRAVENOUS
  Filled 2023-10-25: qty 150

## 2023-10-25 MED ORDER — HEPARIN SOD (PORK) LOCK FLUSH 100 UNIT/ML IV SOLN
500.0000 [IU] | Freq: Once | INTRAVENOUS | Status: AC | PRN
  Administered 2023-10-25: 500 [IU]

## 2023-10-25 MED ORDER — DEXAMETHASONE SODIUM PHOSPHATE 10 MG/ML IJ SOLN
10.0000 mg | Freq: Once | INTRAMUSCULAR | Status: AC
  Administered 2023-10-25: 10 mg via INTRAVENOUS
  Filled 2023-10-25: qty 1

## 2023-10-25 MED ORDER — SODIUM CHLORIDE 0.9% FLUSH
10.0000 mL | Freq: Once | INTRAVENOUS | Status: AC
  Administered 2023-10-25: 10 mL

## 2023-10-25 MED ORDER — SODIUM CHLORIDE 0.9% FLUSH
10.0000 mL | INTRAVENOUS | Status: DC | PRN
  Administered 2023-10-25: 10 mL

## 2023-10-25 MED ORDER — SODIUM CHLORIDE 0.9 % IV SOLN: Freq: Once | INTRAVENOUS | Status: AC

## 2023-10-25 MED ORDER — SODIUM CHLORIDE 0.9 % IV SOLN
175.0000 mg/m2 | Freq: Once | INTRAVENOUS | Status: AC
  Administered 2023-10-25: 324 mg via INTRAVENOUS
  Filled 2023-10-25: qty 54

## 2023-10-25 MED ORDER — FAMOTIDINE IN NACL 20-0.9 MG/50ML-% IV SOLN
20.0000 mg | Freq: Once | INTRAVENOUS | Status: AC
  Administered 2023-10-25: 20 mg via INTRAVENOUS
  Filled 2023-10-25: qty 50

## 2023-10-25 MED ORDER — SODIUM CHLORIDE 0.9 % IV SOLN
552.6000 mg | Freq: Once | INTRAVENOUS | Status: DC
Start: 2023-10-25 — End: 2023-10-25

## 2023-10-25 MED ORDER — SODIUM CHLORIDE 0.9 % IV SOLN
553.0000 mg | Freq: Once | INTRAVENOUS | Status: AC
  Administered 2023-10-25: 550 mg via INTRAVENOUS
  Filled 2023-10-25: qty 55

## 2023-10-25 MED ORDER — DIPHENHYDRAMINE HCL 50 MG/ML IJ SOLN
50.0000 mg | Freq: Once | INTRAMUSCULAR | Status: AC
  Administered 2023-10-25: 50 mg via INTRAVENOUS
  Filled 2023-10-25: qty 1

## 2023-10-25 MED ORDER — SODIUM CHLORIDE 0.9 % IV SOLN
200.0000 mg | Freq: Once | INTRAVENOUS | Status: AC
  Administered 2023-10-25: 200 mg via INTRAVENOUS
  Filled 2023-10-25: qty 200

## 2023-10-25 MED ORDER — PALONOSETRON HCL INJECTION 0.25 MG/5ML
0.2500 mg | Freq: Once | INTRAVENOUS | Status: AC
  Administered 2023-10-25: 0.25 mg via INTRAVENOUS
  Filled 2023-10-25: qty 5

## 2023-10-25 NOTE — Progress Notes (Signed)
[facility] [phone]   [phone]  OFFICE PROGRESS NOTE  Erin Mutton., FNP [address] [place] [address]  DIAGNOSIS: Stage IV (T3, N0, M1C) non-small cell lung cancer, squamous cell carcinoma presented with right upper lobe lung mass in addition to solitary brain metastasis and T1 bone metastasis. She was diagnosed in August 2024.  She also has liver metastasis in December  2024   PDL1 Expression: 20%   PRIOR THERAPY:  1) SRS to the metastatic brain lesion on 07/23/2023 under the care of Dr. [name]. 2) SBRT to the right upper lobe lung mass and SBRT to the solitary osseous metastasis at T1. Last dose on 08/27/23 3) SRS to the new left temporal and occipital lobe metastatic brain lesion under the care of Dr. [name], expected on 09/06/23.   CURRENT THERAPY:  1) palliative systemic chemoimmunotherapy with carboplatin for AUC of 5, paclitaxel 175 Mg/M2 and Keytruda 200 Mg IV every 3 weeks with Neulasta support. First dose expected on 09/28/23.   INTERVAL HISTORY: [name] 62 y.o. female returns to the clinic today for follow-up visit accompanied by her son.Discussed the use of AI scribe software for clinical note transcription with the patient, who gave verbal consent to proceed.  History of Present Illness   [name], a 62 year old patient with a history of stage 4 carcinoma, presented with complaints of fatigue and weakness. The patient had been started on a chemotherapy regimen of carboplatin, paclitaxel, and Keytruda approximately a month prior to the current visit. Following the initiation of chemotherapy, the patient experienced a significant decline in her health, necessitating hospitalization due to a low white blood cell count. During this hospital stay, the patient was treated with antibiotics.  The patient also reported a recent fall, which fortunately did not result in any fractures. However, during the  hospitalization two weeks prior, imaging revealed the presence of lesions in the liver. A subsequent biopsy confirmed the spread of the carcinoma to the liver.  The patient's first round of chemotherapy was marked by severe sickness, which she described as more than just fatigue. Despite these challenges, the patient was prepared to proceed with the second round of chemotherapy. The patient also reported excessive sleepiness and a persistent state of tiredness.  The patient's anemia continued to be a concern, with the possibility of a future blood transfusion if the condition worsened.       MEDICAL HISTORY: Past Medical History:  Diagnosis Date   Acute on chronic respiratory failure (HCC) 10/09/2016   Allergic rhinitis    Breast cancer (HCC) 2016   right breast   COPD, mild (HCC) FOLLOWED BY DR GMWN   Depression    GERD (gastroesophageal reflux disease)    HTN (hypertension)    Hypertension    Iron deficiency anemia    Long-term current use of steroids SYMBICORT INHALER   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    Overweight (BMI 25.0-29.9) 05/11/2016   Palpitations    Personal history of radiation therapy 2016   Sarcoidosis STABLE PER CXR JUNE 2013   Shortness of breath    Sickle cell trait (HCC)     ALLERGIES:  is allergic to codeine.  MEDICATIONS:  Current Outpatient Medications  Medication Sig Dispense Refill   acetaminophen (TYLENOL) 325 MG tablet Take 650 mg by mouth every 6 (six) hours as needed for mild pain or moderate pain.     albuterol (VENTOLIN HFA) 108 (90 Base) MCG/ACT inhaler INHALE  2 PUFFS EVERY 6 HOURS AS NEEDED FOR WHEEZING OR SHORTNESS OF BREATH (Patient taking differently: Inhale 2 puffs into the lungs every 6 (six) hours as needed for shortness of breath or wheezing.) 54 g 0   aspirin 81 MG chewable tablet Chew 1 tablet (81 mg total) by mouth daily. 30 tablet 0   atorvastatin (LIPITOR) 20 MG tablet TAKE 1 TABLET (20 MG TOTAL) BY MOUTH DAILY. 90 tablet 3   dexamethasone  (DECADRON) 4 MG tablet Take 1 tablet (4 mg total) by mouth 2 (two) times daily.     fluticasone (FLONASE) 50 MCG/ACT nasal spray USE 2 SPRAYS IN BOTH  NOSTRILS DAILY AS NEEDED  FOR ALLERGIES (Patient taking differently: Place 2 sprays into both nostrils daily as needed for allergies.) 48 g 11   HYDROcodone-acetaminophen (NORCO) 5-325 MG tablet Take 1 tablet by mouth every 6 (six) hours as needed for moderate pain (pain score 4-6). 45 tablet 0   levETIRAcetam (KEPPRA) 750 MG tablet Take 1 tablet (750 mg total) by mouth 2 (two) times daily. 60 tablet 0   lidocaine-prilocaine (EMLA) cream Apply to affected area once (Patient taking differently: Apply 1 Application topically See admin instructions. Apply to affected area once as directed) 30 g 3   Magnesium 400 MG CAPS Take 400 mg by mouth daily at 6 (six) AM. 30 capsule 0   metoprolol tartrate (LOPRESSOR) 25 MG tablet Take 1 tablet (25 mg total) by mouth daily.     montelukast (SINGULAIR) 10 MG tablet Take 10 mg by mouth at bedtime.     Multiple Vitamin (MULTIVITAMIN) capsule Take 1 capsule by mouth daily with breakfast.     omeprazole (PRILOSEC) 20 MG capsule TAKE 1 CAPSULE EVERY DAY (Patient taking differently: Take 20 mg by mouth daily before breakfast.) 90 capsule 1   ondansetron (ZOFRAN) 4 MG tablet Take 1 tablet (4 mg total) by mouth every 6 (six) hours as needed for nausea. 20 tablet 0   OXYGEN Inhale 2-3 L/min into the lungs See admin instructions. Inhale 2-3 L/min into the lungs CONTINUOUSLY; 2 L/min at rest and 3 L/min if exerted     potassium chloride SA (KLOR-CON M) 20 MEQ tablet Take 1 tablet (20 mEq total) by mouth daily for 14 days. 14 tablet 0   prochlorperazine (COMPAZINE) 10 MG tablet Take 1 tablet (10 mg total) by mouth every 6 (six) hours as needed for nausea or vomiting. 30 tablet 1   sertraline (ZOLOFT) 50 MG tablet TAKE 1 TABLET EVERY DAY (Patient taking differently: Take 50 mg by mouth daily.) 90 tablet 1   STIOLTO RESPIMAT  2.5-2.5 MCG/ACT AERS USE 2 INHALATIONS BY MOUTH DAILY 12 g 3   No current facility-administered medications for this visit.    SURGICAL HISTORY:  Past Surgical History:  Procedure Laterality Date   ADJUSTABLE SUTURE MANIPULATION  05/22/2012   Procedure: ADJUSTABLE SUTURE MANIPULATION;  Surgeon: Corinda Gubler, MD;  Location[facility];  Service: Ophthalmology;  Laterality: Right;   BREAST LUMPECTOMY Right 2016   BRONCHIAL BIOPSY  07/09/2023   Procedure: BRONCHIAL BIOPSIES;  Surgeon: Leslye Peer, MD;  Location: [facility] ENDOSCOPY;  Service: Pulmonary;;   BRONCHIAL BRUSHINGS  07/09/2023   Procedure: BRONCHIAL BRUSHINGS;  Surgeon: Leslye Peer, MD;  Location[facility] ENDOSCOPY;  Service: Pulmonary;;   BRONCHIAL NEEDLE ASPIRATION BIOPSY  07/09/2023   Procedure: BRONCHIAL NEEDLE ASPIRATION BIOPSIES;  Surgeon: Leslye Peer, MD;  Location: [facility] ENDOSCOPY;  Service: Pulmonary;;   CESAREAN SECTION  1986   W/ BILATERAL TUBAL LIGATION   COLONOSCOPY WITH PROPOFOL N/A 02/21/2019   Procedure: COLONOSCOPY WITH PROPOFOL;  Surgeon: Hilarie Fredrickson, MD;  Location: [facility] ENDOSCOPY;  Service: Endoscopy;  Laterality: N/A;   EYE SURGERY     IR IMAGING GUIDED PORT INSERTION  08/01/2023   MEDIAN RECTUS REPAIR  05/22/2012   Procedure: MEDIAN RECTUS REPAIR;  Surgeon: Corinda Gubler, MD;  Location: [facility];  Service: Ophthalmology;  Laterality: Bilateral;  INFERIOR RECTUS RESECTION WITH ADJUSTIBLE SUTURES RIGHT EYE    POLYPECTOMY  02/21/2019   Procedure: POLYPECTOMY;  Surgeon: Hilarie Fredrickson, MD;  Location: [facility] ENDOSCOPY;  Service: Endoscopy;;   RADIOACTIVE SEED GUIDED PARTIAL MASTECTOMY WITH AXILLARY SENTINEL LYMPH NODE BIOPSY Right 08/18/2015   Procedure: RADIOACTIVE SEED GUIDED PARTIAL MASTECTOMY WITH AXILLARY SENTINEL LYMPH NODE BIOPSY;  Surgeon: Almond Lint, MD;  Location: [facility];  Service: General;  Laterality: Right;   TOTAL ROBOTIC ASSISTED LAPAROSCOPIC  HYSTERECTOMY  12-30-2010   SYMPTOMATIC UTERINE FIBROIDS   UPPER TEETH EXTRACTION'S  1992   VIDEO BRONCHOSCOPY WITH RADIAL ENDOBRONCHIAL ULTRASOUND  07/09/2023   Procedure: VIDEO BRONCHOSCOPY WITH RADIAL ENDOBRONCHIAL ULTRASOUND;  Surgeon: Leslye Peer, MD;  Location: [facility] ENDOSCOPY;  Service: Pulmonary;;    REVIEW OF SYSTEMS:  Constitutional: positive for fatigue Eyes: negative Ears, nose, mouth, throat, and face: negative Respiratory: positive for dyspnea on exertion Cardiovascular: negative Gastrointestinal: positive for nausea Genitourinary:negative Integument/breast: negative Hematologic/lymphatic: negative Musculoskeletal:positive for arthralgias Neurological: negative Behavioral/Psych: negative Endocrine: negative Allergic/Immunologic: negative   PHYSICAL EXAMINATION: General appearance: alert, cooperative, fatigued, and no distress Head: Normocephalic, without obvious abnormality, atraumatic Neck: no adenopathy, no JVD, supple, symmetrical, trachea midline, and thyroid not enlarged, symmetric, no tenderness/mass/nodules Lymph nodes: Cervical, supraclavicular, and axillary nodes normal. Resp: clear to auscultation bilaterally Back: symmetric, no curvature. ROM normal. No CVA tenderness. Cardio: regular rate and rhythm, S1, S2 normal, no murmur, click, rub or gallop GI: soft, non-tender; bowel sounds normal; no masses,  no organomegaly Extremities: extremities normal, atraumatic, no cyanosis or edema Neurologic: Alert and oriented X 3, normal strength and tone. Normal symmetric reflexes. Normal coordination and gait  ECOG PERFORMANCE STATUS: 1 - Symptomatic but completely ambulatory  There were no vitals taken for this visit.  LABORATORY DATA: Lab Results  Component Value Date   WBC 24.9 (H) 10/22/2023   HGB 7.8 (L) 10/22/2023   HCT 24.5 (L) 10/22/2023   MCV 86.9 10/22/2023   PLT 99 (L) 10/22/2023      Chemistry      Component Value Date/Time   NA 143 10/22/2023  1147   NA 146 (H) 01/10/2019 1220   K 4.2 10/22/2023 1147   CL 105 10/22/2023 1147   CO2 35 (H) 10/22/2023 1147   BUN 8 10/22/2023 1147   BUN 21 01/10/2019 1220   CREATININE 0.77 10/22/2023 1147   CREATININE 0.64 02/04/2015 1619      Component Value Date/Time   CALCIUM 8.4 (L) 10/22/2023 1147   ALKPHOS 96 10/22/2023 1147   AST 16 10/22/2023 1147   ALT 16 10/22/2023 1147   BILITOT 0.4 10/22/2023 1147       RADIOGRAPHIC STUDIES: DG Lumbar Spine Complete Result Date: 10/20/2023 CLINICAL DATA:  Fall, low back pain EXAM: LUMBAR SPINE - COMPLETE 4+ VIEW COMPARISON:  02/18/2020, CT 10/13/2023 FINDINGS: Normal alignment. No fracture. Early degenerative changes in the lower lumbar spine with slight disc space narrowing and early spurring. Vascular calcifications. No visible aneurysm. IMPRESSION: No acute bony abnormality.  Electronically Signed   By: Charlett Nose M.D.   On: 10/20/2023 03:34   CT Cervical Spine Wo Contrast Result Date: 10/20/2023 CLINICAL DATA:  Polytrauma, blunt.  Fall. EXAM: CT CERVICAL SPINE WITHOUT CONTRAST TECHNIQUE: Multidetector CT imaging of the cervical spine was performed without intravenous contrast. Multiplanar CT image reconstructions were also generated. RADIATION DOSE REDUCTION: This exam was performed according to the departmental dose-optimization program which includes automated exposure control, adjustment of the mA and/or kV according to patient size and/or use of iterative reconstruction technique. COMPARISON:  None Available. FINDINGS: Alignment: Normal Skull base and vertebrae: No acute fracture. No primary bone lesion or focal pathologic process. Soft tissues and spinal canal: No prevertebral fluid or swelling. No visible canal hematoma. Disc levels: Mild degenerative disc disease in the lower cervical spine. No disc herniation. Upper chest: Again partially visualized is the right upper lobe mass and layering large right pleural effusion. Other: None IMPRESSION:  No acute bony abnormality. Known right apical mass and large layering right pleural effusion. Electronically Signed   By: Charlett Nose M.D.   On: 10/20/2023 02:08   CT Head Wo Contrast Result Date: 10/20/2023 CLINICAL DATA:  Polytrauma, blunt.  Fall. EXAM: CT HEAD WITHOUT CONTRAST TECHNIQUE: Contiguous axial images were obtained from the base of the skull through the vertex without intravenous contrast. RADIATION DOSE REDUCTION: This exam was performed according to the departmental dose-optimization program which includes automated exposure control, adjustment of the mA and/or kV according to patient size and/or use of iterative reconstruction technique. COMPARISON:  None Available. FINDINGS: Brain: Again noted are areas of edema within the left temporal lobe and left occipital lobe in areas of prior known metastases. No areas of hemorrhage. No hydrocephalus. No midline shift., Improved since prior study Vascular: No hyperdense vessel or unexpected calcification. Skull: No acute calvarial abnormality. 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 No acute findings. Electronically Signed   By: Charlett Nose M.D.   On: 10/20/2023 02:05   DG HIP UNILAT WITH PELVIS 2-3 VIEWS RIGHT Result Date: 10/18/2023 CLINICAL DATA:  Fall EXAM: DG HIP (WITH OR WITHOUT PELVIS) 2-3V RIGHT COMPARISON:  CT 10/13/2023 FINDINGS: No fracture or malalignment.  Moderate hip arthritis. IMPRESSION: No acute osseous abnormality. Electronically Signed   By: Jasmine Pang M.D.   On: 10/18/2023 23:45   DG HIP UNILAT WITH PELVIS 2-3 VIEWS LEFT Result Date: 10/18/2023 CLINICAL DATA:  Fall EXAM: DG HIP (WITH OR WITHOUT PELVIS) 2-3V LEFT COMPARISON:  CT 10/13/2023 FINDINGS: SI joints are non widened. Pubic symphysis and rami are intact. No acute fracture or malalignment. IMPRESSION: No acute osseous abnormality Electronically Signed   By:  Jasmine Pang M.D.   On: 10/18/2023 23:44   US BIOPSY (LIVER) Result Date: 10/18/2023 INDICATION: 62 year old female with a history liver lesion, possible tumor versus abscess EXAM: ULTRASOUND-GUIDED LIVER LESION BIOPSY MEDICATIONS: None. ANESTHESIA/SEDATION: Moderate (conscious) sedation was employed during this procedure. A total of Versed 1.5 mg and Fentanyl 50 mcg was administered intravenously by the radiology nurse. Total intra-service moderate Sedation Time: 18 minutes. The patient's level of consciousness and vital signs were monitored continuously by radiology nursing throughout the procedure under my direct supervision. COMPLICATIONS: NONE PROCEDURE: Informed written consent was obtained from the patient after a thorough discussion of the procedural risks, benefits and alternatives. All questions were addressed. Maximal Sterile Barrier Technique was utilized including caps, mask, sterile gowns, sterile gloves, sterile  drape, hand hygiene and skin antiseptic. A timeout was performed prior to the initiation of the procedure. Ultrasound survey of the right liver lobe performed with images stored and sent to PACs. The lesion in segment 6 is hypoechoic and quite small. The only way to visualize the lesion for a possible biopsy was from a medial to lateral approach from the anterior. The right lower thorax/right upper abdomen was prepped with chlorhexidine in a sterile fashion, and a sterile drape was applied covering the operative field. A sterile gown and sterile gloves were used for the procedure. Local anesthesia was provided with 1% Lidocaine. The patient was prepped and draped sterilely and the skin and subcutaneous tissues were generously infiltrated with 1% lidocaine. A 17 gauge introducer needle was then advanced under ultrasound guidance in a subcostal location into the right liver lobe. The stylet was removed, and aspiration was attempted. No aspirate returned. Multiple separate 18 gauge core  biopsy were then retrieved. Samples were placed into formalin for transportation to the lab. The needle was removed, and a final ultrasound image was performed. The patient tolerated the procedure well and remained hemodynamically stable throughout. No complications were encountered and no significant blood loss was encounter. IMPRESSION: Status post ultrasound-guided biopsy of right liver lesion. Aspiration was attempted, with no aspirate achieved. Signed, Yvone Neu. Miachel Roux, RPVI [facility] Electronically Signed   By: Gilmer Mor D.O.   On: 10/18/2023 11:23   MR ABDOMEN W WO CONTRAST Result Date: 10/14/2023 CLINICAL DATA:  Liver lesion EXAM: MRI ABDOMEN WITHOUT AND WITH CONTRAST TECHNIQUE: Multiplanar multisequence MR imaging of the abdomen was performed both before and after the administration of intravenous contrast. CONTRAST:  7mL GADAVIST GADOBUTROL 1 MMOL/ML IV SOLN COMPARISON:  CT 10/13/2023.  Older CT 02/19/2019. FINDINGS: Lower chest: Tiny left and small right pleural effusion. Heart enlarged is some linear signal changes in the lung bases. Atelectasis or scarring. Hepatobiliary: No biliary ductal dilatation. Gallbladder is nondilated. Patent portal vein. Complex lesion seen posteriorly in the right hepatic lobe segment 6/7 is again identified today. This lesion has a fluid-fluid level measures 16 by 12 mm. There is some marginal enhancement of the lesion as well as some mild adjacent edema. Again differential would include an abscess. Smaller lesion identified just anterior and superior to this lesion is dark on precontrast T1, bright on T2 and also has some marginal enhancement measuring 12 mm. The lesion seen in segment 7 more superiorly is less visible today but again there is significant breathing motion Pancreas: No mass, inflammatory changes, or other parenchymal abnormality identified. Spleen: Small splenic lesion again  identified posteriorly which is dark on T1, moderately bright on T2. Assessment of enhancement is difficult with motion. Again lesion measures 8 mm. Adrenals/Urinary Tract: Adrenal glands are preserved. Benign bilateral renal cystic foci are identified. Stomach/Bowel: Visualized bowel is nondilated. The stranding along the course of the mid descending colon is again noted. Vascular/Lymphatic: Mild atherosclerotic changes along the aorta. Normal caliber aorta and IVC. No discrete abnormal lymph node enlargement in the visualized abdomen. Other:  Evaluation significantly limited by motion. Musculoskeletal: Degenerative changes along the spine. IMPRESSION: Three liver lesions identified. Once again the dominant focus posteriorly in the right hepatic lobe has a rim enhancement as well as abnormal diffusion. This has concerning for abscess over a malignant lesion. The 2 other smaller lesions are more indeterminate and would recommend close follow-up. Indeterminate small splenic lesion.  Attention on follow-up. Small right  and tiny left pleural effusion. Evaluation significantly limited by motion. Electronically Signed   By: Karen Kays M.D.   On: 10/14/2023 17:14   CT ABDOMEN PELVIS W CONTRAST Result Date: 10/13/2023 CLINICAL DATA:  Lung cancer.  Sepsis. EXAM: CT ABDOMEN AND PELVIS WITH CONTRAST TECHNIQUE: Multidetector CT imaging of the abdomen and pelvis was performed using the standard protocol following bolus administration of intravenous contrast. RADIATION DOSE REDUCTION: This exam was performed according to the departmental dose-optimization program which includes automated exposure control, adjustment of the mA and/or kV according to patient size and/or use of iterative reconstruction technique. CONTRAST:  75mL OMNIPAQUE IOHEXOL 300 MG/ML  SOLN COMPARISON:  PET-CT 08/03/2023. standard CT April 2020 FINDINGS: Lower chest: Bronchiectasis seen along the lung bases. Breathing motion. Dependent areas of  atelectasis and scarring. Trace pleural fluid on the right. Right-sided fat containing diaphragmatic hernia again seen. Hepatobiliary: Nodular contours of the liver. Cystic lesion posterior along the right hepatic lobe on series 3, image 21 has a capsule and some associated parenchymal edema surrounding lesion measuring 19 x 16 mm. Small focus just above this well but more simple appearing measuring 10 mm and a vague low-density area in segment 7 measuring 7 mm on series 3, image 10. These are not clearly seen on the previous examinations. Patent portal vein. Gallbladder is mildly distended. Pancreas: Unremarkable. No pancreatic ductal dilatation or surrounding inflammatory changes. Spleen: Spleen is nonenlarged. There is a cystic area posterior along the spleen which was not seen previously measuring 8 mm. Not clearly a simple cyst Adrenals/Urinary Tract: Adrenal glands are preserved. Benign bilateral renal cystic foci identified, Bosniak 1 and 2 lesions. No enhancing mass or collecting system dilatation. The ureters have normal course and caliber extending down to the urinary bladder. Preserved contours of the urinary bladder. Stomach/Bowel: Stomach is decompressed. Small bowel is nondilated. The large bowel has a normal course and caliber. Left-sided areas of wall thickening identified with diverticula and stranding. Please correlate for a colitis. Mild changes as well along the sigmoid and rectum. Fluid along the lumen of the right side of the colon normal appendix seen in the right hemipelvis. Posteromedial to the cecum. Vascular/Lymphatic: Aortic atherosclerosis. No enlarged abdominal or pelvic lymph nodes. Reproductive: Status post hysterectomy. No adnexal masses. Other: Anasarca. Musculoskeletal: Degenerative changes seen of the spine and pelvis particularly of the right hip. IMPRESSION: New complex cystic areas in the right hepatic lobe. Based on appearance this has worrisome for potential abscess formation  but there is a differential. Please correlate with clinical presentation. If needed MRI evaluation could be considered. Largest measures 19 mm in the right hepatic lobe. Small cystic area in the spleen is mildly complex and more indeterminate. This also could be assessed on follow-up. Wall thickening with stranding along the descending colon greater than the rectosigmoid colon. Please correlate for a colitis. No obstruction. Recommend follow-up. Tiny right pleural effusion. Electronically Signed   By: Karen Kays M.D.   On: 10/13/2023 14:21   DG Chest Port 1 View Result Date: 10/12/2023 CLINICAL DATA:  Possible sepsis. EXAM: PORTABLE CHEST 1 VIEW COMPARISON:  09/17/2023 and older exams.  CT, 09/19/2023. FINDINGS: Cardiac silhouette is normal in size. No convincing mediastinal or hilar masses. Right anterior chest wall Port-A-Cath is stable. Right-sided volume loss with mediastinal shift, stable. There is an ill-defined masslike opacity in the right upper lobe, unchanged. Stable interstitial type opacities right mid lung and both lung bases. No new lung abnormalities. No convincing pleural effusion.  No  pneumothorax. Skeletal structures are grossly intact. Surgical vascular clips on the right reflect prior breast surgery, also stable IMPRESSION: 1. No significant change from the recent prior chest radiograph. 2. 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 3. Right-sided volume loss with mediastinal shift. 4. No new abnormalities. Electronically Signed   By: Amie Portland M.D.   On: 10/12/2023 08:45    ASSESSMENT AND PLAN: This is a very pleasant 62 years old African-American female with a stage IV (T3, N0, M1 C) non-small cell lung cancer, squamous cell carcinoma presented with right upper lobe lung mass in addition to solitary brain metastasis and T1 bone lesion diagnosed in August 2024 with PD-L1 expression of 20%.  She is status post SBRT to the right upper lobe lung mass as well as  the T1 bone metastasis and the brain metastasis under the care of Dr. [name].  The patient is currently undergoing systemic chemoimmunotherapy with carboplatin, paclitaxel and Keytruda first dose was given on September 28, 2023 with Neulasta support.  Status post 1 cycle.    Metastatic Carcinoma Carcinoma with metastasis to the liver confirmed by recent biopsy. Undergoing chemotherapy with carboplatin, paclitaxel, and Keytruda. Reports significant fatigue and weakness. Recent hospitalization for neutropenia, now resolved. Discussed chemotherapy risks (fatigue, neutropenia, hospitalization) and benefits (potential control of cancer spread). Alternatives include palliative care.  - Proceed with the second round of chemotherapy today - Continue weekly blood work to monitor anemia and white blood cell count - Administer blood transfusion if anemia worsens  Anemia Continued anemia, not yet severe enough for transfusion. Inquired about iron supplementation. - Continue iron supplements, one tablet daily with orange juice or vitamin C  Follow-up - Schedule follow-up appointment in three weeks for the third round of chemotherapy.   The patient was advised to call immediately if she has any other concerning symptoms in the interval. The patient voices understanding of current disease status and treatment options and is in agreement with the current care plan.  All questions were answered. The patient knows to call the clinic with any problems, questions or concerns. We can certainly see the patient much sooner if necessary.  The total time spent in the appointment was 30 minutes.  Disclaimer: This note was dictated with voice recognition software. Similar sounding words can inadvertently be transcribed and may not be corrected upon review.

## 2023-10-25 NOTE — Progress Notes (Signed)
Carboplatin dosing reduced to AUC=5 and paclitaxel dose reduced to 175mg /m2 per Dr. Arbutus Ped.

## 2023-10-25 NOTE — Patient Instructions (Signed)
[facility] - A DEPT OF [facility]  Discharge Instructions: Thank you for choosing [facility] to provide your oncology and hematology care.   If you have a lab appointment with the Cancer Center, please go directly to the Cancer Center and check in at the registration area.   Wear comfortable clothing and clothing appropriate for easy access to any Portacath or PICC line.   We strive to give you quality time with your provider. You may need to reschedule your appointment if you arrive late (15 or more minutes).  Arriving late affects you and other patients whose appointments are after yours.  Also, if you miss three or more appointments without notifying the office, you may be dismissed from the clinic at the provider's discretion.      For prescription refill requests, have your pharmacy contact our office and allow 72 hours for refills to be completed.    Today you received the following chemotherapy and/or immunotherapy agents: Keytruda/Taxol/Carboplatin      To help prevent nausea and vomiting after your treatment, we encourage you to take your nausea medication as directed.  BELOW ARE SYMPTOMS THAT SHOULD BE REPORTED IMMEDIATELY: *FEVER GREATER THAN 100.4 F (38 C) OR HIGHER *CHILLS OR SWEATING *NAUSEA AND VOMITING THAT IS NOT CONTROLLED WITH YOUR NAUSEA MEDICATION *UNUSUAL SHORTNESS OF BREATH *UNUSUAL BRUISING OR BLEEDING *URINARY PROBLEMS (pain or burning when urinating, or frequent urination) *BOWEL PROBLEMS (unusual diarrhea, constipation, pain near the anus) TENDERNESS IN MOUTH AND THROAT WITH OR WITHOUT PRESENCE OF ULCERS (sore throat, sores in mouth, or a toothache) UNUSUAL RASH, SWELLING OR PAIN  UNUSUAL VAGINAL DISCHARGE OR ITCHING   Items with * indicate a potential emergency and should be followed up as soon as possible or go to the Emergency Department if any problems should occur.  Please show the CHEMOTHERAPY ALERT CARD  or IMMUNOTHERAPY ALERT CARD at check-in to the Emergency Department and triage nurse.  Should you have questions after your visit or need to cancel or reschedule your appointment, please contact [facility] - A DEPT OF [facility]  Dept: [phone]  and follow the prompts.  Office hours are 8:00 a.m. to 4:30 p.m. Monday - Friday. Please note that voicemails left after 4:00 p.m. may not be returned until the following business day.  We are closed weekends and major holidays. You have access to a nurse at all times for urgent questions. Please call the main number to the clinic Dept: [phone] and follow the prompts.   For any non-urgent questions, you may also contact your provider using MyChart. We now offer e-Visits for anyone 43 and older to request care online for non-urgent symptoms. For details visit [email or web address].   Also download the MyChart app! Go to the app store, search "MyChart", open the app, select [place], and log in with your MyChart username and password.

## 2023-10-25 NOTE — Progress Notes (Unsigned)
Weekly sample to BB ordered.

## 2023-10-26 ENCOUNTER — Ambulatory Visit: Payer: 59 | Admitting: Radiology

## 2023-10-27 ENCOUNTER — Inpatient Hospital Stay: Payer: 59

## 2023-10-27 ENCOUNTER — Telehealth: Payer: Self-pay

## 2023-10-27 NOTE — Telephone Encounter (Signed)
Patient called in regards to scheduled injection appointment for 12/14 at 11:45 AM. Patient reports that she will not be able to come in for appointment today and will need to reschedule.  Patient's appointments updated and note added for port flush appointment on Monday, 12/16 at 1 PM to also give missed neulasta injection.  Patient reminded of Monday's appointments and to arrive 15 minutes early. Patient confirmed appointment dates and times and knows she can check her mychart to verify any appointments.

## 2023-10-29 ENCOUNTER — Encounter: Payer: Self-pay | Admitting: Internal Medicine

## 2023-10-29 ENCOUNTER — Telehealth: Payer: Self-pay | Admitting: *Deleted

## 2023-10-29 ENCOUNTER — Ambulatory Visit: Payer: 59 | Admitting: Internal Medicine

## 2023-10-29 ENCOUNTER — Inpatient Hospital Stay: Payer: 59

## 2023-10-29 ENCOUNTER — Telehealth: Payer: Self-pay

## 2023-10-29 ENCOUNTER — Ambulatory Visit: Payer: 59

## 2023-10-29 ENCOUNTER — Other Ambulatory Visit: Payer: 59

## 2023-10-29 ENCOUNTER — Ambulatory Visit: Payer: 59 | Admitting: Radiology

## 2023-10-29 DIAGNOSIS — C3491 Malignant neoplasm of unspecified part of right bronchus or lung: Secondary | ICD-10-CM

## 2023-10-29 NOTE — Telephone Encounter (Signed)
Spoke with patient in regards to missed appt today and she stated that she forgot about it.  Rescheduled her injection appt for tomorrow morning at 9 am. Pt verbalized understanding.

## 2023-10-29 NOTE — Telephone Encounter (Signed)
Patient stated that she is having transportation issues so I offered a referral for social work for transportation assistance and pt agreed. Referral placed to social work.  Pt verbalized understanding and reminded her of appt tomorrow at 9.

## 2023-10-29 NOTE — Telephone Encounter (Signed)
Attempted to reach patient to advise that Dr [name] needs her to have a repeat Brain MRI prior to seeing her.  She was a no show for the originally scheduled scan.    Left message for patient to call back to provide details of rescheduling her MRI.  Also canceled new patient visit tomorrow until MRI is rescheduled.

## 2023-10-30 ENCOUNTER — Ambulatory Visit: Payer: 59

## 2023-10-30 ENCOUNTER — Inpatient Hospital Stay: Payer: 59 | Admitting: Internal Medicine

## 2023-10-30 ENCOUNTER — Telehealth: Payer: Self-pay | Admitting: Radiation Therapy

## 2023-10-30 NOTE — Telephone Encounter (Signed)
I spoke with [name] about the rescheduled brain MRI on 12/27. She has the appt information written down and plans to attend.   Jalene Mullet R.T.(R)(T) Radiation Special Procedures Navigator

## 2023-10-31 ENCOUNTER — Inpatient Hospital Stay: Payer: 59 | Admitting: Licensed Clinical Social Worker

## 2023-10-31 ENCOUNTER — Ambulatory Visit: Payer: 59 | Admitting: Urology

## 2023-10-31 DIAGNOSIS — C3491 Malignant neoplasm of unspecified part of right bronchus or lung: Secondary | ICD-10-CM

## 2023-10-31 NOTE — Progress Notes (Signed)
[facility] CSW Progress Note  Clinical Child psychotherapist contacted patient by phone to discuss transportation concerns.  Pt reports she has family members that have assisted w/ transportation; however, most of her family works and can not consistently bring her to appointments.  As a result pt has missed medical appointments.  Referral sent to transportation w/ a request to call pt.  CSW to remain available as appropriate throughout duration of treatment.      Rachel Moulds, LCSW Clinical Social Worker [facility]

## 2023-11-03 ENCOUNTER — Emergency Department (HOSPITAL_COMMUNITY): Payer: 59

## 2023-11-03 ENCOUNTER — Other Ambulatory Visit: Payer: Self-pay

## 2023-11-03 ENCOUNTER — Encounter (HOSPITAL_COMMUNITY): Payer: Self-pay

## 2023-11-03 ENCOUNTER — Inpatient Hospital Stay (HOSPITAL_COMMUNITY)
Admission: EM | Admit: 2023-11-03 | Discharge: 2023-11-09 | DRG: 871 | Disposition: A | Discharge status: Home Health | Payer: 59 | Attending: Internal Medicine | Admitting: Internal Medicine

## 2023-11-03 DIAGNOSIS — E785 Hyperlipidemia, unspecified: Secondary | ICD-10-CM | POA: Diagnosis present

## 2023-11-03 DIAGNOSIS — M7989 Other specified soft tissue disorders: Secondary | ICD-10-CM | POA: Diagnosis not present

## 2023-11-03 DIAGNOSIS — Z79899 Other long term (current) drug therapy: Secondary | ICD-10-CM

## 2023-11-03 DIAGNOSIS — Z8269 Family history of other diseases of the musculoskeletal system and connective tissue: Secondary | ICD-10-CM

## 2023-11-03 DIAGNOSIS — K219 Gastro-esophageal reflux disease without esophagitis: Secondary | ICD-10-CM | POA: Diagnosis present

## 2023-11-03 DIAGNOSIS — D6959 Other secondary thrombocytopenia: Secondary | ICD-10-CM | POA: Diagnosis present

## 2023-11-03 DIAGNOSIS — Z87891 Personal history of nicotine dependence: Secondary | ICD-10-CM

## 2023-11-03 DIAGNOSIS — F32A Depression, unspecified: Secondary | ICD-10-CM | POA: Diagnosis present

## 2023-11-03 DIAGNOSIS — C3491 Malignant neoplasm of unspecified part of right bronchus or lung: Secondary | ICD-10-CM | POA: Diagnosis present

## 2023-11-03 DIAGNOSIS — Z90710 Acquired absence of both cervix and uterus: Secondary | ICD-10-CM

## 2023-11-03 DIAGNOSIS — A419 Sepsis, unspecified organism: Principal | ICD-10-CM

## 2023-11-03 DIAGNOSIS — Z7952 Long term (current) use of systemic steroids: Secondary | ICD-10-CM

## 2023-11-03 DIAGNOSIS — J159 Unspecified bacterial pneumonia: Secondary | ICD-10-CM | POA: Diagnosis present

## 2023-11-03 DIAGNOSIS — Z803 Family history of malignant neoplasm of breast: Secondary | ICD-10-CM

## 2023-11-03 DIAGNOSIS — I10 Essential (primary) hypertension: Secondary | ICD-10-CM | POA: Diagnosis present

## 2023-11-03 DIAGNOSIS — Z885 Allergy status to narcotic agent status: Secondary | ICD-10-CM

## 2023-11-03 DIAGNOSIS — Z9981 Dependence on supplemental oxygen: Secondary | ICD-10-CM

## 2023-11-03 DIAGNOSIS — Z1152 Encounter for screening for COVID-19: Secondary | ICD-10-CM | POA: Diagnosis not present

## 2023-11-03 DIAGNOSIS — Z66 Do not resuscitate: Secondary | ICD-10-CM | POA: Diagnosis present

## 2023-11-03 DIAGNOSIS — Z853 Personal history of malignant neoplasm of breast: Secondary | ICD-10-CM

## 2023-11-03 DIAGNOSIS — T451X5A Adverse effect of antineoplastic and immunosuppressive drugs, initial encounter: Secondary | ICD-10-CM | POA: Diagnosis present

## 2023-11-03 DIAGNOSIS — D701 Agranulocytosis secondary to cancer chemotherapy: Secondary | ICD-10-CM

## 2023-11-03 DIAGNOSIS — E876 Hypokalemia: Secondary | ICD-10-CM | POA: Diagnosis not present

## 2023-11-03 DIAGNOSIS — Z8249 Family history of ischemic heart disease and other diseases of the circulatory system: Secondary | ICD-10-CM

## 2023-11-03 DIAGNOSIS — Z825 Family history of asthma and other chronic lower respiratory diseases: Secondary | ICD-10-CM | POA: Diagnosis not present

## 2023-11-03 DIAGNOSIS — C7951 Secondary malignant neoplasm of bone: Secondary | ICD-10-CM | POA: Diagnosis present

## 2023-11-03 DIAGNOSIS — Z8349 Family history of other endocrine, nutritional and metabolic diseases: Secondary | ICD-10-CM

## 2023-11-03 DIAGNOSIS — E663 Overweight: Secondary | ICD-10-CM | POA: Diagnosis present

## 2023-11-03 DIAGNOSIS — J9611 Chronic respiratory failure with hypoxia: Secondary | ICD-10-CM | POA: Diagnosis present

## 2023-11-03 DIAGNOSIS — I272 Pulmonary hypertension, unspecified: Secondary | ICD-10-CM | POA: Diagnosis present

## 2023-11-03 DIAGNOSIS — J189 Pneumonia, unspecified organism: Secondary | ICD-10-CM

## 2023-11-03 DIAGNOSIS — J440 Chronic obstructive pulmonary disease with acute lower respiratory infection: Secondary | ICD-10-CM | POA: Diagnosis present

## 2023-11-03 DIAGNOSIS — D61818 Other pancytopenia: Secondary | ICD-10-CM | POA: Diagnosis present

## 2023-11-03 DIAGNOSIS — Z7982 Long term (current) use of aspirin: Secondary | ICD-10-CM

## 2023-11-03 DIAGNOSIS — Y95 Nosocomial condition: Secondary | ICD-10-CM | POA: Diagnosis present

## 2023-11-03 DIAGNOSIS — C7931 Secondary malignant neoplasm of brain: Secondary | ICD-10-CM | POA: Diagnosis present

## 2023-11-03 DIAGNOSIS — R040 Epistaxis: Secondary | ICD-10-CM | POA: Diagnosis not present

## 2023-11-03 DIAGNOSIS — Z6825 Body mass index (BMI) 25.0-25.9, adult: Secondary | ICD-10-CM

## 2023-11-03 DIAGNOSIS — D869 Sarcoidosis, unspecified: Secondary | ICD-10-CM | POA: Diagnosis present

## 2023-11-03 DIAGNOSIS — R109 Unspecified abdominal pain: Secondary | ICD-10-CM | POA: Diagnosis present

## 2023-11-03 DIAGNOSIS — D573 Sickle-cell trait: Secondary | ICD-10-CM | POA: Diagnosis present

## 2023-11-03 LAB — URINALYSIS, ROUTINE W REFLEX MICROSCOPIC
Bacteria, UA: NONE SEEN
Bilirubin Urine: NEGATIVE
Glucose, UA: NEGATIVE mg/dL
Ketones, ur: NEGATIVE mg/dL
Leukocytes,Ua: NEGATIVE
Nitrite: NEGATIVE
Protein, ur: NEGATIVE mg/dL
Specific Gravity, Urine: 1.01 (ref 1.005–1.030)
pH: 7 (ref 5.0–8.0)

## 2023-11-03 LAB — I-STAT CG4 LACTIC ACID, ED
Lactic Acid, Venous: 1.7 mmol/L (ref 0.5–1.9)
Lactic Acid, Venous: 1.6 mmol/L (ref 0.5–1.9)

## 2023-11-03 LAB — I-STAT CHEM 8, ED
BUN: 12 mg/dL (ref 8–23)
Calcium, Ion: 1.03 mmol/L — ABNORMAL LOW (ref 1.15–1.40)
Chloride: 95 mmol/L — ABNORMAL LOW (ref 98–111)
Creatinine, Ser: 0.8 mg/dL (ref 0.44–1.00)
Glucose, Bld: 73 mg/dL (ref 70–99)
HCT: 22 % — ABNORMAL LOW (ref 36.0–46.0)
Hemoglobin: 7.5 g/dL — ABNORMAL LOW (ref 12.0–15.0)
Potassium: 4 mmol/L (ref 3.5–5.1)
Sodium: 138 mmol/L (ref 135–145)
TCO2: 35 mmol/L — ABNORMAL HIGH (ref 22–32)

## 2023-11-03 LAB — CBC WITH DIFFERENTIAL/PLATELET
Abs Immature Granulocytes: 0.06 10*3/uL (ref 0.00–0.07)
Basophils Absolute: 0 10*3/uL (ref 0.0–0.1)
Basophils Relative: 1 %
Eosinophils Absolute: 0 10*3/uL (ref 0.0–0.5)
Eosinophils Relative: 0 %
HCT: 29 % — ABNORMAL LOW (ref 36.0–46.0)
Hemoglobin: 8.9 g/dL — ABNORMAL LOW (ref 12.0–15.0)
Immature Granulocytes: 5 %
Lymphocytes Relative: 12 %
Lymphs Abs: 0.2 10*3/uL — ABNORMAL LOW (ref 0.7–4.0)
MCH: 27.7 pg (ref 26.0–34.0)
MCHC: 30.7 g/dL (ref 30.0–36.0)
MCV: 90.3 fL (ref 80.0–100.0)
Monocytes Absolute: 0.1 10*3/uL (ref 0.1–1.0)
Monocytes Relative: 5 %
Neutro Abs: 0.9 10*3/uL — ABNORMAL LOW (ref 1.7–7.7)
Neutrophils Relative %: 77 %
Platelets: 65 10*3/uL — ABNORMAL LOW (ref 150–400)
RBC: 3.21 MIL/uL — ABNORMAL LOW (ref 3.87–5.11)
RDW: 16.9 % — ABNORMAL HIGH (ref 11.5–15.5)
WBC: 1.2 10*3/uL — CL (ref 4.0–10.5)
nRBC: 0 % (ref 0.0–0.2)

## 2023-11-03 LAB — PROTIME-INR
INR: 1.3 — ABNORMAL HIGH (ref 0.8–1.2)
Prothrombin Time: 16.1 s — ABNORMAL HIGH (ref 11.4–15.2)

## 2023-11-03 LAB — COMPREHENSIVE METABOLIC PANEL
ALT: 15 U/L (ref 0–44)
AST: 20 U/L (ref 15–41)
Albumin: 3 g/dL — ABNORMAL LOW (ref 3.5–5.0)
Alkaline Phosphatase: 60 U/L (ref 38–126)
Anion gap: 9 (ref 5–15)
BUN: 14 mg/dL (ref 8–23)
CO2: 35 mmol/L — ABNORMAL HIGH (ref 22–32)
Calcium: 8.5 mg/dL — ABNORMAL LOW (ref 8.9–10.3)
Chloride: 97 mmol/L — ABNORMAL LOW (ref 98–111)
Creatinine, Ser: 0.8 mg/dL (ref 0.44–1.00)
GFR, Estimated: >60 mL/min (ref >60)
Glucose, Bld: 76 mg/dL (ref 70–99)
Potassium: 4 mmol/L (ref 3.5–5.1)
Sodium: 141 mmol/L (ref 135–145)
Total Bilirubin: 0.8 mg/dL (ref <1.2)
Total Protein: 5.8 g/dL — ABNORMAL LOW (ref 6.5–8.1)

## 2023-11-03 LAB — TROPONIN I (HIGH SENSITIVITY)
Troponin I (High Sensitivity): 22 ng/L — ABNORMAL HIGH (ref <18)
Troponin I (High Sensitivity): 24 ng/L — ABNORMAL HIGH (ref <18)

## 2023-11-03 LAB — RESP PANEL BY RT-PCR (RSV, FLU A&B, COVID)  RVPGX2
Influenza A by PCR: NEGATIVE
Influenza B by PCR: NEGATIVE
Resp Syncytial Virus by PCR: NEGATIVE
SARS Coronavirus 2 by RT PCR: NEGATIVE

## 2023-11-03 LAB — LIPASE, BLOOD: Lipase: 22 U/L (ref 11–51)

## 2023-11-03 LAB — APTT: aPTT: 30 s (ref 24–36)

## 2023-11-03 MED ORDER — ACETAMINOPHEN 325 MG PO TABS
650.0000 mg | ORAL_TABLET | Freq: Once | ORAL | Status: AC
  Administered 2023-11-03: 650 mg via ORAL
  Filled 2023-11-03: qty 2

## 2023-11-03 MED ORDER — ACETAMINOPHEN 325 MG PO TABS
650.0000 mg | ORAL_TABLET | Freq: Four times a day (QID) | ORAL | Status: DC | PRN
  Administered 2023-11-03: 650 mg via ORAL
  Filled 2023-11-03: qty 2

## 2023-11-03 MED ORDER — DEXAMETHASONE 4 MG PO TABS
4.0000 mg | ORAL_TABLET | Freq: Two times a day (BID) | ORAL | Status: DC
  Administered 2023-11-03 – 2023-11-09 (×13): 4 mg via ORAL
  Filled 2023-11-03 (×13): qty 1

## 2023-11-03 MED ORDER — ASPIRIN 81 MG PO CHEW
81.0000 mg | CHEWABLE_TABLET | Freq: Every day | ORAL | Status: DC
  Administered 2023-11-03 – 2023-11-09 (×7): 81 mg via ORAL
  Filled 2023-11-03 (×7): qty 1

## 2023-11-03 MED ORDER — HYDRALAZINE HCL 20 MG/ML IJ SOLN
5.0000 mg | Freq: Four times a day (QID) | INTRAMUSCULAR | Status: DC | PRN
  Administered 2023-11-05: 5 mg via INTRAVENOUS
  Filled 2023-11-03: qty 1

## 2023-11-03 MED ORDER — HYDROCODONE-ACETAMINOPHEN 5-325 MG PO TABS: 1.0000 | ORAL_TABLET | Freq: Four times a day (QID) | ORAL | Status: DC | PRN

## 2023-11-03 MED ORDER — SODIUM CHLORIDE 0.9 % IV SOLN
2.0000 g | Freq: Once | INTRAVENOUS | Status: AC
  Administered 2023-11-03: 2 g via INTRAVENOUS
  Filled 2023-11-03: qty 12.5

## 2023-11-03 MED ORDER — MONTELUKAST SODIUM 10 MG PO TABS
10.0000 mg | ORAL_TABLET | Freq: Every day | ORAL | Status: DC
  Administered 2023-11-03 – 2023-11-08 (×6): 10 mg via ORAL
  Filled 2023-11-03 (×6): qty 1

## 2023-11-03 MED ORDER — ARFORMOTEROL TARTRATE 15 MCG/2ML IN NEBU
15.0000 ug | INHALATION_SOLUTION | Freq: Two times a day (BID) | RESPIRATORY_TRACT | Status: DC
  Administered 2023-11-03 – 2023-11-09 (×12): 15 ug via RESPIRATORY_TRACT
  Filled 2023-11-03 (×13): qty 2

## 2023-11-03 MED ORDER — LEVETIRACETAM 500 MG PO TABS
750.0000 mg | ORAL_TABLET | Freq: Two times a day (BID) | ORAL | Status: DC
  Administered 2023-11-03 – 2023-11-09 (×13): 750 mg via ORAL
  Filled 2023-11-03 (×13): qty 1

## 2023-11-03 MED ORDER — PANTOPRAZOLE SODIUM 40 MG PO TBEC
40.0000 mg | DELAYED_RELEASE_TABLET | Freq: Every day | ORAL | Status: DC
  Administered 2023-11-03 – 2023-11-09 (×7): 40 mg via ORAL
  Filled 2023-11-03 (×7): qty 1

## 2023-11-03 MED ORDER — TRIAMTERENE-HCTZ 37.5-25 MG PO TABS
1.0000 | ORAL_TABLET | Freq: Every day | ORAL | Status: DC
  Administered 2023-11-03 – 2023-11-09 (×7): 1 via ORAL
  Filled 2023-11-03 (×7): qty 1

## 2023-11-03 MED ORDER — FENTANYL CITRATE PF 50 MCG/ML IJ SOSY
25.0000 ug | PREFILLED_SYRINGE | Freq: Once | INTRAMUSCULAR | Status: DC
  Filled 2023-11-03: qty 1

## 2023-11-03 MED ORDER — SODIUM CHLORIDE 0.9 % IV SOLN
2.0000 g | Freq: Three times a day (TID) | INTRAVENOUS | Status: DC
  Administered 2023-11-03 – 2023-11-09 (×18): 2 g via INTRAVENOUS
  Filled 2023-11-03 (×19): qty 12.5

## 2023-11-03 MED ORDER — TRAZODONE HCL 50 MG PO TABS
25.0000 mg | ORAL_TABLET | Freq: Every evening | ORAL | Status: DC | PRN
Start: 2023-11-03 — End: 2023-11-09

## 2023-11-03 MED ORDER — ONDANSETRON HCL 4 MG/2ML IJ SOLN: 4.0000 mg | Freq: Four times a day (QID) | INTRAMUSCULAR | Status: DC | PRN

## 2023-11-03 MED ORDER — METOPROLOL TARTRATE 25 MG PO TABS
25.0000 mg | ORAL_TABLET | Freq: Every day | ORAL | Status: DC
Start: 2023-11-03 — End: 2023-11-09
  Administered 2023-11-03 – 2023-11-09 (×7): 25 mg via ORAL
  Filled 2023-11-03 (×7): qty 1

## 2023-11-03 MED ORDER — ATORVASTATIN CALCIUM 20 MG PO TABS
20.0000 mg | ORAL_TABLET | Freq: Every day | ORAL | Status: DC
Start: 2023-11-03 — End: 2023-11-09
  Administered 2023-11-03 – 2023-11-09 (×7): 20 mg via ORAL
  Filled 2023-11-03 (×5): qty 1
  Filled 2023-11-03: qty 2
  Filled 2023-11-03: qty 1

## 2023-11-03 MED ORDER — SODIUM CHLORIDE 0.9% FLUSH: 10.0000 mL | INTRAVENOUS | Status: DC | PRN

## 2023-11-03 MED ORDER — SODIUM CHLORIDE 0.9 % IV SOLN
1500.0000 mg | Freq: Once | INTRAVENOUS | Status: AC
  Administered 2023-11-03: 1500 mg via INTRAVENOUS
  Filled 2023-11-03: qty 30

## 2023-11-03 MED ORDER — IOHEXOL 350 MG/ML SOLN
100.0000 mL | Freq: Once | INTRAVENOUS | Status: AC | PRN
  Administered 2023-11-03: 100 mL via INTRAVENOUS

## 2023-11-03 MED ORDER — UMECLIDINIUM BROMIDE 62.5 MCG/ACT IN AEPB
1.0000 | INHALATION_SPRAY | Freq: Every day | RESPIRATORY_TRACT | Status: DC
  Administered 2023-11-04 – 2023-11-09 (×6): 1 via RESPIRATORY_TRACT
  Filled 2023-11-03: qty 7

## 2023-11-03 MED ORDER — METRONIDAZOLE 500 MG/100ML IV SOLN
500.0000 mg | Freq: Once | INTRAVENOUS | Status: AC
  Administered 2023-11-03: 500 mg via INTRAVENOUS
  Filled 2023-11-03: qty 100

## 2023-11-03 MED ORDER — LACTATED RINGERS IV BOLUS
1000.0000 mL | Freq: Once | INTRAVENOUS | Status: AC
  Administered 2023-11-03: 1000 mL via INTRAVENOUS

## 2023-11-03 MED ORDER — ENOXAPARIN SODIUM 40 MG/0.4ML IJ SOSY: 40.0000 mg | PREFILLED_SYRINGE | INTRAMUSCULAR | Status: DC

## 2023-11-03 MED ORDER — ALBUTEROL SULFATE (2.5 MG/3ML) 0.083% IN NEBU: 2.5000 mg | INHALATION_SOLUTION | RESPIRATORY_TRACT | Status: DC | PRN

## 2023-11-03 MED ORDER — LACTATED RINGERS IV SOLN
INTRAVENOUS | Status: AC
Start: 2023-11-03 — End: 2023-11-03

## 2023-11-03 MED ORDER — CHLORHEXIDINE GLUCONATE CLOTH 2 % EX PADS
6.0000 | MEDICATED_PAD | Freq: Every day | CUTANEOUS | Status: DC
  Administered 2023-11-03 – 2023-11-09 (×7): 6 via TOPICAL

## 2023-11-03 MED ORDER — VANCOMYCIN HCL 1.5 G IV SOLR
1500.0000 mg | INTRAVENOUS | Status: DC
  Administered 2023-11-04 – 2023-11-08 (×5): 1500 mg via INTRAVENOUS
  Filled 2023-11-03 (×6): qty 30

## 2023-11-03 MED ORDER — SERTRALINE HCL 50 MG PO TABS
50.0000 mg | ORAL_TABLET | Freq: Every day | ORAL | Status: DC
  Administered 2023-11-03 – 2023-11-09 (×7): 50 mg via ORAL
  Filled 2023-11-03 (×7): qty 1

## 2023-11-03 MED ORDER — MAGNESIUM OXIDE -MG SUPPLEMENT 400 (240 MG) MG PO TABS
400.0000 mg | ORAL_TABLET | Freq: Every day | ORAL | Status: DC
Start: 2023-11-03 — End: 2023-11-09
  Administered 2023-11-03 – 2023-11-09 (×6): 400 mg via ORAL
  Filled 2023-11-03 (×6): qty 1

## 2023-11-03 MED ORDER — LOPERAMIDE HCL 2 MG PO CAPS
2.0000 mg | ORAL_CAPSULE | ORAL | Status: DC | PRN
  Administered 2023-11-03: 2 mg via ORAL
  Filled 2023-11-03: qty 1

## 2023-11-03 MED ORDER — ONDANSETRON HCL 4 MG PO TABS: 4.0000 mg | ORAL_TABLET | Freq: Four times a day (QID) | ORAL | Status: DC | PRN

## 2023-11-03 MED ORDER — ACETAMINOPHEN 650 MG RE SUPP: 650.0000 mg | Freq: Four times a day (QID) | RECTAL | Status: DC | PRN

## 2023-11-03 NOTE — ED Notes (Signed)
Pt has swelling to just above the elbow on the inside of her upper arm on the right side.  No IV was present at this site upon viewing.

## 2023-11-03 NOTE — Progress Notes (Signed)
Pharmacy Antibiotic Note  [name] is a 62 y.o. female admitted on 11/03/2023 with pneumonia.  Pharmacy has been consulted for cefepime and vancomycin dosing.  Today, 11/03/23 WBC low, febrile Tmax 100.6 F SCr 0.8  Plan: Cefepime 2 g IV q8h Vancomycin 1500 mg loading dose followed by 1500 mg IV q24h for estimated AUC of 483 Goal AUC 400-550 Monitor MRSA PCR, culture data, renal function     Temp (24hrs), Avg:100.4 F (38 C), Min:100.1 F (37.8 C), Max:100.6 F (38.1 C)  Recent Labs  Lab 11/03/23 0313 11/03/23 0447 11/03/23 0452 11/03/23 0807  WBC 1.2*  --   --   --   CREATININE 0.80 0.80  --   --   LATICACIDVEN  --   --  1.7 1.6    Estimated Creatinine Clearance: 72.5 mL/min (by C-G formula based on SCr of 0.8 mg/dL).    Allergies  Allergen Reactions   Codeine Other (See Comments)    Avoids-felt bad when taken before Pt claims she can have this but does not like to take    Antimicrobials this admission: metronidazole 12/21 >>  cefepime 12/21 >> Vancomycin 12/21 >>   Dose adjustments this admission:  Microbiology results: 12/21 BCx:   Cindi Carbon, PharmD 11/03/2023 10:17 AM

## 2023-11-03 NOTE — ED Notes (Signed)
Found pt standing at her door pulling IV pump behind her with the pulse ox and bp cords and oxygen tubing all wrapped up and she had urinated in the floor and was asking for the bathroom.  Assisted pt with wheel chair to bathroom, cleaned up pts bed and changed all sheets and blankets out and after pt was finished in bathroom, placed her on clean linen and put a diaper on pt and secured her back in bed.

## 2023-11-03 NOTE — Progress Notes (Signed)
ED Pharmacy Antibiotic Sign Off An antibiotic consult was received from an ED provider for Cefepime per pharmacy dosing for intra-abdominal infection. A chart review was completed to assess appropriateness.   The following one time order(s) were placed:  Cefepime 2gm IV  Further antibiotic and/or antibiotic pharmacy consults should be ordered by the admitting provider if indicated.   Thank you for allowing pharmacy to be a part of this patient's care.   Maryellen Pile, [facility]  Clinical Pharmacist 11/03/23 3:25 AM

## 2023-11-03 NOTE — ED Provider Notes (Addendum)
[place] EMERGENCY DEPARTMENT AT [facility] Provider Note   CSN: [id number] Arrival date & time: 11/03/23  0240     History  Chief Complaint  Patient presents with   Abdominal Pain    [name] is a 62 y.o. 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  Hypertension.  She was recently admitted to the hospital for neutropenic fever discharge on 10/19/2023.Marland Kitchen  Patient appears disoriented, unsure why she is here.  Does continue to complain of abdominal pain. Level 5 caveat due to patient's confusion.  DNR on record. Patient has a port, but refusing access.  HPI     Home Medications Prior to Admission medications   Medication Sig Start Date End Date Taking? Authorizing Provider  MAGNESIUM-OXIDE 400 (240 Mg) MG tablet Take 1 tablet by mouth every morning. 10/19/23  Yes [provider]  triamterene-hydrochlorothiazide (MAXZIDE-25) 37.5-25 MG tablet Take 1 tablet by mouth daily. 09/07/23  Yes [provider]  acetaminophen (TYLENOL) 325 MG tablet Take 650 mg by mouth every 6 (six) hours as needed for mild pain or moderate pain.    [provider]  albuterol (VENTOLIN HFA) 108 (90 Base) MCG/ACT inhaler INHALE 2 PUFFS EVERY 6 HOURS AS NEEDED FOR WHEEZING OR SHORTNESS OF BREATH Patient taking differently: Inhale 2 puffs into the lungs every 6 (six) hours as needed for shortness of breath or wheezing. 09/23/19   Nyoka Cowden, MD  aspirin 81 MG chewable tablet Chew 1 tablet (81 mg total) by mouth daily. 09/22/23   Swayze, Ava, DO  atorvastatin (LIPITOR) 20 MG tablet TAKE 1 TABLET (20 MG TOTAL) BY MOUTH DAILY. 03/28/21   Sandre Kitty, MD  dexamethasone (DECADRON) 4 MG tablet Take 1 tablet (4 mg total) by mouth 2 (two) times daily. 10/19/23   Dorcas Carrow, MD  fluticasone (FLONASE) 50 MCG/ACT nasal spray USE 2 SPRAYS  IN BOTH  NOSTRILS DAILY AS NEEDED  FOR ALLERGIES Patient taking differently: Place 2 sprays into both nostrils daily as needed for allergies. 08/15/21   Nyoka Cowden, MD  HYDROcodone-acetaminophen (NORCO) 5-325 MG tablet Take 1 tablet by mouth every 6 (six) hours as needed for moderate pain (pain score 4-6). 10/22/23   Pickenpack-Cousar, Arty Baumgartner, NP  levETIRAcetam (KEPPRA) 750 MG tablet Take 1 tablet (750 mg total) by mouth 2 (two) times daily. 09/21/23   Swayze, Ava, DO  lidocaine-prilocaine (EMLA) cream Apply to affected area once Patient taking differently: Apply 1 Application topically See admin instructions. Apply to affected area once as directed 08/01/23   Si Gaul, MD  Magnesium 400 MG CAPS Take 400 mg by mouth daily at 6 (six) AM. 10/19/23 11/18/23  Dorcas Carrow, MD  metoprolol tartrate (LOPRESSOR) 25 MG tablet Take 1 tablet (25 mg total) by mouth daily. 07/09/23   Leslye Peer, MD  montelukast (SINGULAIR) 10 MG tablet Take 10 mg by mouth at bedtime. 07/31/22   [provider]  Multiple Vitamin (MULTIVITAMIN) capsule Take 1 capsule by mouth daily with breakfast.    [provider]  omeprazole (PRILOSEC) 20 MG capsule TAKE 1 CAPSULE EVERY DAY Patient taking differently: Take 20 mg by mouth daily before breakfast. 01/03/21   Sandre Kitty, MD  ondansetron (ZOFRAN) 4 MG tablet Take 1 tablet (4 mg total) by mouth every 6 (six) hours as needed for nausea. 06/28/23  Alwyn Ren, MD  OXYGEN Inhale 2-3 L/min into the lungs See admin instructions. Inhale 2-3 L/min into the lungs CONTINUOUSLY; 2 L/min at rest and 3 L/min if exerted    [provider]  potassium chloride SA (KLOR-CON M) 20 MEQ tablet Take 1 tablet (20 mEq total) by mouth daily for 14 days. 10/19/23 11/02/23  Dorcas Carrow, MD  prochlorperazine (COMPAZINE) 10 MG tablet Take 1 tablet (10 mg total) by mouth every 6 (six) hours as needed for nausea or vomiting. 08/01/23   Si Gaul, MD   sertraline (ZOLOFT) 50 MG tablet TAKE 1 TABLET EVERY DAY Patient taking differently: Take 50 mg by mouth daily. 09/11/20   Sandre Kitty, MD  STIOLTO RESPIMAT 2.5-2.5 MCG/ACT AERS USE 2 INHALATIONS BY MOUTH DAILY 09/17/23   Nyoka Cowden, MD      Allergies    Codeine    Review of Systems   Review of Systems  Unable to perform ROS: Mental status change  Gastrointestinal:  Positive for abdominal pain.    Physical Exam Updated Vital Signs BP 130/74 (BP Location: Left Arm)   Pulse (!) 119   Temp (!) 100.6 F (38.1 C) (Oral)   Resp 18   SpO2 100%  Physical Exam Vitals and nursing note reviewed.  Constitutional:      Appearance: She is obese. She is ill-appearing. She is not toxic-appearing.  HENT:     Head: Normocephalic and atraumatic.     Mouth/Throat:     Mouth: Mucous membranes are moist.     Pharynx: No oropharyngeal exudate or posterior oropharyngeal erythema.  Eyes:     General:        Right eye: No discharge.        Left eye: No discharge.     Conjunctiva/sclera: Conjunctivae normal.  Cardiovascular:     Rate and Rhythm: Regular rhythm. Tachycardia present.     Pulses: Normal pulses.  Pulmonary:     Effort: Pulmonary effort is normal. No respiratory distress.     Breath sounds: Normal breath sounds. No wheezing or rales.  Chest:     Chest wall: No mass, lacerations, deformity, swelling, tenderness, crepitus or edema.  Abdominal:     General: Bowel sounds are normal. There is no distension.     Palpations: Abdomen is soft.     Tenderness: There is generalized abdominal tenderness and tenderness in the epigastric area and periumbilical area. There is no right CVA tenderness, left CVA tenderness, guarding or rebound.  Musculoskeletal:        General: No deformity.     Cervical back: Neck supple.  Skin:    General: Skin is warm and dry.  Neurological:     Mental Status: She is alert. Mental status is at baseline. She is disoriented.  Psychiatric:         Mood and Affect: Mood normal.     ED Results / Procedures / Treatments   Labs (all labs ordered are listed, but only abnormal results are displayed) Labs Reviewed  CBC WITH DIFFERENTIAL/PLATELET - Abnormal; Notable for the following components:      Result Value   WBC 1.2 (*)    RBC 3.21 (*)    Hemoglobin 8.9 (*)    HCT 29.0 (*)    RDW 16.9 (*)    Platelets 65 (*)    Neutro Abs 0.9 (*)    Lymphs Abs 0.2 (*)    All other components within normal limits  COMPREHENSIVE METABOLIC PANEL -  Abnormal; Notable for the following components:   Chloride 97 (*)    CO2 35 (*)    Calcium 8.5 (*)    Total Protein 5.8 (*)    Albumin 3.0 (*)    All other components within normal limits  URINALYSIS, ROUTINE W REFLEX MICROSCOPIC - Abnormal; Notable for the following components:   Hgb urine dipstick SMALL (*)    All other components within normal limits  PROTIME-INR - Abnormal; Notable for the following components:   Prothrombin Time 16.1 (*)    INR 1.3 (*)    All other components within normal limits  I-STAT CHEM 8, ED - Abnormal; Notable for the following components:   Chloride 95 (*)    Calcium, Ion 1.03 (*)    TCO2 35 (*)    Hemoglobin 7.5 (*)    HCT 22.0 (*)    All other components within normal limits  TROPONIN I (HIGH SENSITIVITY) - Abnormal; Notable for the following components:   Troponin I (High Sensitivity) 22 (*)    All other components within normal limits  TROPONIN I (HIGH SENSITIVITY) - Abnormal; Notable for the following components:   Troponin I (High Sensitivity) 24 (*)    All other components within normal limits  RESP PANEL BY RT-PCR (RSV, FLU A&B, COVID)  RVPGX2  CULTURE, BLOOD (ROUTINE X 2)  CULTURE, BLOOD (ROUTINE X 2)  LIPASE, BLOOD  APTT  I-STAT CG4 LACTIC ACID, ED  I-STAT CG4 LACTIC ACID, ED    EKG None  Radiology No results found.  Procedures .Ultrasound ED Peripheral IV (Provider)  Date/Time: 11/03/2023 6:06 AM  Performed by: Paris Lore, PA-C Authorized by: Paris Lore, PA-C   Procedure details:    Indications: hydration, multiple failed IV attempts and poor IV access     Skin Prep: chlorhexidine gluconate     Location: R upper arm.   Angiocath:  20 G   Bedside Ultrasound Guided: Yes     Images: not archived     Patient tolerated procedure without complications: Yes     Dressing applied: Yes   .Critical Care  Performed by: Paris Lore, PA-C Authorized by: Paris Lore, PA-C   Critical care provider statement:    Critical care time (minutes):  45   Critical care was time spent personally by me on the following activities:  Development of treatment plan with patient or surrogate, discussions with consultants, evaluation of patient's response to treatment, examination of patient, obtaining history from patient or surrogate, ordering and performing treatments and interventions, ordering and review of laboratory studies, ordering and review of radiographic studies, pulse oximetry and re-evaluation of patient's condition     Medications Ordered in ED Medications  lactated ringers infusion (0 mLs Intravenous Paused 11/03/23 0407)  fentaNYL (SUBLIMAZE) injection 25 mcg (25 mcg Intravenous Not Given 11/03/23 0518)  lactated ringers bolus 1,000 mL (has no administration in time range)  acetaminophen (TYLENOL) tablet 650 mg (has no administration in time range)  ceFEPIme (MAXIPIME) 2 g in sodium chloride 0.9 % 100 mL IVPB (0 g Intravenous Stopped 11/03/23 0428)  metroNIDAZOLE (FLAGYL) IVPB 500 mg (0 mg Intravenous Stopped 11/03/23 0542)  iohexol (OMNIPAQUE) 350 MG/ML injection 100 mL (100 mLs Intravenous Contrast Given 11/03/23 0540)    ED Course/ Medical Decision Making/ A&P Clinical Course as of 11/03/23 0639  Sat Nov 03, 2023  0459 US guided IV 20 gauge in the Right upper arm, draw and flushes. Second set of blood cultures obtained from  this line by this provider.  [RS]  1610 CT tech Anson Fret messaged this provider to report that US guided IV blew during contrast administration for CTA. No images obtained and line is not usable.  [RS]  0630 EDP Dr. Wilkie Aye attempted to place USPIV, unsuccessful. IV team consult placed.  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  Pulmonary unremarkable, abdominal exam with generalized tenderness without focality.  No lower extremity edema.  Amount and/or Complexity of Data Reviewed Labs: ordered.    Details: CBC with pancytopenia.  WBC 1.2, neutrophils 0.9.  Thrombocytopenic with platelets of 65.  Anemia to 8.9.  CMP unremarkable.  Troponin mildly elevated to 22.  Lactic acid is normal. Radiology: ordered.  Risk OTC drugs. Prescription drug management.   Care of this patient signed out to oncoming ED provider J. Jerene Pitch, PA-C at time of shift change. All pertinent HPI, physical exam, and laboratory findings were discussed with them prior to my departure. Disposition of patient pending completion of workup, reevaluation, and clinical judgement of oncoming ED provider.   This chart was dictated using voice recognition software, Dragon. Despite the best efforts of this provider to proofread and correct errors, errors may still occur which can change documentation meaning.     Final Clinical Impression(s) / ED Diagnoses Final diagnoses:  None    Rx / DC Orders ED Discharge Orders     None          Sherrilee Gilles 11/03/23 9604    Shon Baton, MD 11/03/23 [phone]

## 2023-11-03 NOTE — ED Provider Notes (Signed)
Patient given in sign out by [employer], PA-C.  Please review their note for patient HPI, physical exam, workup.  At this time the plan is follow-up on labs and imaging anticipate admission for suspected neutropenic fever.  CTA shows pulmonary hypertension along with possible infection in the left upper lobe which patient is administering antibiotics for.  At this time patient is stable to be admitted.  Hospitalist consulted.  Consults: Erenest Blank, MD hospitalist  I spoke to the hospitalist and patient was accepted for admission.  Patient stable for admission at this time.   Netta Corrigan, PA-C 11/03/23 1003    Shon Baton, MD 11/08/23 2257

## 2023-11-03 NOTE — H&P (Signed)
History and Physical  ALUEL BUIST QMV:[id number] DOB: 1961-06-14 DOA: 11/03/2023  PCP: [name]., FNP   Chief Complaint: Abdominal pain, fever  HPI: [name] is a 62 y.o. 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  Patient is not a very good historian, seems to be mildly confused though she is completely oriented.  Tells me that she is here because of abdominal discomfort, had diarrhea when she was here last month.  Workup as below shows continued neutropenia, evidence of new pulmonary consolidation.  She was given broad-spectrum sepsis antibiotics, and admitted to the hospitalist service.  Review of Systems: Please see HPI for pertinent positives and negatives. A complete 10 system review of systems are otherwise negative.  Past Medical History:  Diagnosis Date   Acute on chronic respiratory failure (HCC) 10/09/2016   Allergic rhinitis    Breast cancer (HCC) 2016   right breast   COPD, mild (HCC) FOLLOWED BY DR MWUX   Depression    GERD (gastroesophageal reflux disease)    HTN (hypertension)    Hypertension    Iron deficiency anemia    Long-term current use of steroids SYMBICORT INHALER   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    Overweight (BMI 25.0-29.9) 05/11/2016   Palpitations    Personal history of radiation therapy 2016   Sarcoidosis STABLE PER CXR JUNE 2013   Shortness of breath    Sickle cell trait (HCC)    Past Surgical History:  Procedure Laterality Date   ADJUSTABLE SUTURE MANIPULATION  05/22/2012   Procedure: ADJUSTABLE SUTURE MANIPULATION;  Surgeon: Corinda Gubler, MD;  Location: [facility];  Service: Ophthalmology;  Laterality: Right;    BREAST LUMPECTOMY Right 2016   BRONCHIAL BIOPSY  07/09/2023   Procedure: BRONCHIAL BIOPSIES;  Surgeon: Leslye Peer, MD;  Location: [facility] ENDOSCOPY;  Service: Pulmonary;;   BRONCHIAL BRUSHINGS  07/09/2023   Procedure: BRONCHIAL BRUSHINGS;  Surgeon: Leslye Peer, MD;  Location: [facility] ENDOSCOPY;  Service: Pulmonary;;   BRONCHIAL NEEDLE ASPIRATION BIOPSY  07/09/2023   Procedure: BRONCHIAL NEEDLE ASPIRATION BIOPSIES;  Surgeon: Leslye Peer, MD;  Location: [facility] ENDOSCOPY;  Service: Pulmonary;;   CESAREAN SECTION  1986   W/ BILATERAL TUBAL LIGATION   COLONOSCOPY WITH PROPOFOL N/A 02/21/2019   Procedure: COLONOSCOPY WITH PROPOFOL;  Surgeon: Hilarie Fredrickson, MD;  Location: [facility] ENDOSCOPY;  Service: Endoscopy;  Laterality: N/A;   EYE SURGERY     IR IMAGING GUIDED PORT INSERTION  08/01/2023   MEDIAN RECTUS REPAIR  05/22/2012   Procedure: MEDIAN RECTUS REPAIR;  Surgeon: Corinda Gubler, MD;  Location: [facility];  Service: Ophthalmology;  Laterality: Bilateral;  INFERIOR RECTUS RESECTION WITH ADJUSTIBLE SUTURES RIGHT EYE    POLYPECTOMY  02/21/2019   Procedure: POLYPECTOMY;  Surgeon: Hilarie Fredrickson, MD;  Location: [facility] ENDOSCOPY;  Service: Endoscopy;;   RADIOACTIVE SEED GUIDED PARTIAL MASTECTOMY WITH AXILLARY SENTINEL LYMPH NODE BIOPSY Right 08/18/2015   Procedure: RADIOACTIVE SEED GUIDED PARTIAL MASTECTOMY WITH AXILLARY SENTINEL LYMPH NODE BIOPSY;  Surgeon: Almond Lint, MD;  Location: [facility];  Service: General;  Laterality: Right;   TOTAL ROBOTIC ASSISTED LAPAROSCOPIC HYSTERECTOMY  12-30-2010   SYMPTOMATIC UTERINE FIBROIDS   UPPER TEETH EXTRACTION'S  1992   VIDEO BRONCHOSCOPY WITH RADIAL ENDOBRONCHIAL ULTRASOUND  07/09/2023   Procedure: VIDEO BRONCHOSCOPY WITH RADIAL ENDOBRONCHIAL ULTRASOUND;  Surgeon: Leslye Peer, MD;  Location: [facility] ENDOSCOPY;  Service: Pulmonary;;   Social History:  reports that she quit smoking about 19 years ago. Her smoking use included cigarettes.  She started smoking about 39 years ago. She has a 20 pack-year smoking history. She has never used smokeless tobacco. She reports that she does not drink alcohol and does not use drugs.  Allergies  Allergen Reactions   Codeine Other (See Comments)    Avoids-felt bad when taken before Pt claims she can have this but does not like to take    Family History  Problem Relation Age of Onset   Hyperlipidemia Mother    Asthma Mother    Lupus Mother    Breast cancer Mother 35   Hypertension Father    Hyperlipidemia Father    Hypertension Sister    Hypertension Brother    Cancer Neg Hx    Diabetes Neg Hx    Coronary artery disease Neg Hx      Prior to Admission medications   Medication Sig Start Date End Date Taking? Authorizing Provider  MAGNESIUM-OXIDE 400 (240 Mg) MG tablet Take 1 tablet by mouth every morning. 10/19/23  Yes [provider]  triamterene-hydrochlorothiazide (MAXZIDE-25) 37.5-25 MG tablet Take 1 tablet by mouth daily. 09/07/23  Yes [provider]  acetaminophen (TYLENOL) 325 MG tablet Take 650 mg by mouth every 6 (six) hours as needed for mild pain or moderate pain.    [provider]  albuterol (VENTOLIN HFA) 108 (90 Base) MCG/ACT inhaler INHALE 2 PUFFS EVERY 6 HOURS AS NEEDED FOR WHEEZING OR SHORTNESS OF BREATH Patient taking differently: Inhale 2 puffs into the lungs every 6 (six) hours as needed for shortness of breath or wheezing. 09/23/19   Nyoka Cowden, MD  aspirin 81 MG chewable tablet Chew 1 tablet (81 mg total) by mouth daily. 09/22/23   Swayze, Ava, DO  atorvastatin (LIPITOR) 20 MG tablet TAKE 1 TABLET (20 MG TOTAL) BY MOUTH DAILY. 03/28/21   Sandre Kitty, MD  dexamethasone (DECADRON) 4 MG tablet Take 1 tablet (4 mg total) by mouth 2 (two) times daily. 10/19/23   Dorcas Carrow, MD  fluticasone (FLONASE) 50 MCG/ACT nasal spray USE 2 SPRAYS IN BOTH  NOSTRILS DAILY AS NEEDED  FOR ALLERGIES Patient taking differently: Place 2 sprays  into both nostrils daily as needed for allergies. 08/15/21   Nyoka Cowden, MD  HYDROcodone-acetaminophen (NORCO) 5-325 MG tablet Take 1 tablet by mouth every 6 (six) hours as needed for moderate pain (pain score 4-6). 10/22/23   Pickenpack-Cousar, Arty Baumgartner, NP  levETIRAcetam (KEPPRA) 750 MG tablet Take 1 tablet (750 mg total) by mouth 2 (two) times daily. 09/21/23   Swayze, Ava, DO  lidocaine-prilocaine (EMLA) cream Apply to affected area once Patient taking differently: Apply 1 Application topically See admin instructions. Apply to affected area once as directed 08/01/23   Si Gaul, MD  Magnesium 400 MG CAPS Take 400 mg by mouth daily at 6 (six) AM. 10/19/23 11/18/23  Dorcas Carrow, MD  metoprolol tartrate (LOPRESSOR) 25 MG tablet Take 1 tablet (25 mg total) by mouth daily. 07/09/23   Leslye Peer, MD  montelukast (SINGULAIR) 10 MG tablet Take 10 mg by mouth at bedtime. 07/31/22   [provider]  Multiple Vitamin (MULTIVITAMIN) capsule Take 1 capsule by mouth daily with breakfast.  [provider]  omeprazole (PRILOSEC) 20 MG capsule TAKE 1 CAPSULE EVERY DAY Patient taking differently: Take 20 mg by mouth daily before breakfast. 01/03/21   Sandre Kitty, MD  ondansetron (ZOFRAN) 4 MG tablet Take 1 tablet (4 mg total) by mouth every 6 (six) hours as needed for nausea. 06/28/23   Alwyn Ren, MD  OXYGEN Inhale 2-3 L/min into the lungs See admin instructions. Inhale 2-3 L/min into the lungs CONTINUOUSLY; 2 L/min at rest and 3 L/min if exerted    [provider]  potassium chloride SA (KLOR-CON M) 20 MEQ tablet Take 1 tablet (20 mEq total) by mouth daily for 14 days. 10/19/23 11/02/23  Dorcas Carrow, MD  prochlorperazine (COMPAZINE) 10 MG tablet Take 1 tablet (10 mg total) by mouth every 6 (six) hours as needed for nausea or vomiting. 08/01/23   Si Gaul, MD  sertraline (ZOLOFT) 50 MG tablet TAKE 1 TABLET EVERY DAY Patient taking differently: Take 50  mg by mouth daily. 09/11/20   Sandre Kitty, MD  STIOLTO RESPIMAT 2.5-2.5 MCG/ACT AERS USE 2 INHALATIONS BY MOUTH DAILY 09/17/23   Nyoka Cowden, MD    Physical Exam: BP 130/74 (BP Location: Left Arm)   Pulse (!) 119   Temp (!) 100.6 F (38.1 C) (Oral)   Resp 18   SpO2 100%  General:  Alert, oriented, calm, in no acute distress, right upper chest port is accessed Eyes: EOMI, clear conjuctivae, white sclerea Neck: supple, no masses, trachea mildline  Cardiovascular: RRR, no murmurs or rubs, no peripheral edema  Respiratory: clear to auscultation bilaterally, no wheezes, no crackles  Abdomen: soft, nontender, nondistended, normal bowel tones heard  Skin: dry, no rashes  Musculoskeletal: no joint effusions, normal range of motion  Psychiatric: appropriate affect, normal speech  Neurologic: extraocular muscles intact, clear speech, moving all extremities with intact sensorium         Labs on Admission:  Basic Metabolic Panel: Recent Labs  Lab 11/03/23 0313 11/03/23 0447  NA 141 138  K 4.0 4.0  CL 97* 95*  CO2 35*  --   GLUCOSE 76 73  BUN 14 12  CREATININE 0.80 0.80  CALCIUM 8.5*  --    Liver Function Tests: Recent Labs  Lab 11/03/23 0313  AST 20  ALT 15  ALKPHOS 60  BILITOT 0.8  PROT 5.8*  ALBUMIN 3.0*   Recent Labs  Lab 11/03/23 0313  LIPASE 22   No results for input(s): "AMMONIA" in the last 168 hours. CBC: Recent Labs  Lab 11/03/23 0313 11/03/23 0447  WBC 1.2*  --   NEUTROABS 0.9*  --   HGB 8.9* 7.5*  HCT 29.0* 22.0*  MCV 90.3  --   PLT 65*  --    Cardiac Enzymes: No results for input(s): "CKTOTAL", "CKMB", "CKMBINDEX", "TROPONINI" in the last 168 hours. BNP (last 3 results) No results for input(s): "BNP" in the last 8760 hours.  ProBNP (last 3 results) No results for input(s): "PROBNP" in the last 8760 hours.  CBG: No results for input(s): "GLUCAP" in the last 168 hours.  Radiological Exams on Admission: CT Angio Chest/Abd/Pel for  Dissection W and/or Wo Contrast Result Date: 11/03/2023 CLINICAL DATA:  Abdominal pain, tachycardia, pulsatile abdominal mass history of lung cancer * Tracking Code: BO * EXAM: CT ANGIOGRAPHY CHEST, ABDOMEN AND PELVIS TECHNIQUE: Non-contrast CT of the chest was initially obtained. Multidetector CT imaging through the chest, abdomen and pelvis was performed using the standard protocol during bolus  administration of intravenous contrast. Multiplanar reconstructed images and MIPs were obtained and reviewed to evaluate the vascular anatomy. RADIATION DOSE REDUCTION: This exam was performed according to the departmental dose-optimization program which includes automated exposure control, adjustment of the mA and/or kV according to patient size and/or use of iterative reconstruction technique. CONTRAST:  OMNIPAQUE IOHEXOL 350 MG/ML SOLN COMPARISON:  CT chest, 09/19/2023, CT abdomen pelvis, 10/13/2023 FINDINGS: CTA CHEST FINDINGS VASCULAR Aorta: Satisfactory opacification of the aorta. Normal contour and caliber of the thoracic aorta. No evidence of aneurysm, dissection, or other acute aortic pathology. Moderate aortic atherosclerosis Cardiovascular: Right chest port catheter. No evidence of pulmonary embolism on limited non-tailored examination. Normal heart size. Enlargement of the main pulmonary artery measuring up to 3.9 cm in caliber. No pericardial effusion. Review of the MIP images confirms the above findings. NON VASCULAR Mediastinum/Nodes: No enlarged mediastinal, hilar, or axillary lymph nodes. Thyroid gland, trachea, and esophagus demonstrate no significant findings. Lungs/Pleura: Small right pleural effusion, similar to prior examination. Perhaps slightly diminished size of a cavitary mass of the right upper lobe measuring 4.9 x 3.2 cm, previously 4.8 x 3.6 cm, with somewhat increased cavitation (series 12, image 34). New, dense nodular consolidation in the anterior left upper lobe measuring 2.4 x 1.8  cm (series 12, image 55). Diffuse bilateral bronchial wall thickening. Bandlike scarring of the bilateral lung bases. Musculoskeletal: No chest wall abnormality. No acute osseous findings. Review of the MIP images confirms the above findings. CTA ABDOMEN AND PELVIS FINDINGS VASCULAR Normal contour and caliber of the abdominal aorta. No evidence of aneurysm, dissection, or other acute aortic pathology. Standard branching pattern of the abdominal aorta with solitary bilateral renal arteries. Moderate aortic atherosclerosis. Review of the MIP images confirms the above findings. NON-VASCULAR Hepatobiliary: No significant change in hypodense lesions of the posterior liver, largest measuring 1.9 x 1.5 cm (series 22, image 163). No gallstones, gallbladder wall thickening, or biliary dilatation. Pancreas: Unremarkable. No pancreatic ductal dilatation or surrounding inflammatory changes. Spleen: Normal in size without significant abnormality. Adrenals/Urinary Tract: Adrenal glands are unremarkable. Simple, benign renal cortical cysts, for which no further follow-up or characterization is required. Kidneys are otherwise normal, without renal calculi, solid lesion, or hydronephrosis. Bladder is unremarkable. Stomach/Bowel: Stomach is within normal limits. Appendix not clearly visualized and may be surgically absent. No evidence of bowel wall thickening, distention, or inflammatory changes. Lymphatic: No enlarged abdominal or pelvic lymph nodes. Reproductive: Status post hysterectomy Other: No abdominal wall hernia or abnormality. No ascites. Musculoskeletal: No acute osseous findings. IMPRESSION: 1. Normal contour and caliber of the thoracic and abdominal aorta. No evidence of aneurysm, dissection, or other acute aortic pathology. Moderate aortic atherosclerosis. 2. Perhaps slightly diminished size of a cavitary mass of the right upper lobe with somewhat increased cavitation. Findings are consistent with known primary lung  malignancy. 3. New, dense nodular consolidation in the anterior left upper lobe measuring 2.4 x 1.8 cm. Findings are most consistent with infection rapid interval development. 4. Small right pleural effusion, similar to prior examination. 5. No significant change in hypodense lesions of the posterior liver, largest measuring 1.9 x 1.5 cm. These remain suspicious for metastases. 6. Enlargement of the main pulmonary artery, as can be seen in pulmonary hypertension. Aortic Atherosclerosis (ICD10-I70.0). Electronically Signed   By: Jearld Lesch M.D.   On: 11/03/2023 09:28   DG Chest Portable 1 View Result Date: 11/03/2023 CLINICAL DATA:  Sepsis. Abdominal pain for 3 days. History of non-small cell lung cancer. EXAM: PORTABLE CHEST  1 VIEW COMPARISON:  10/12/2023 FINDINGS: Right chest wall porta catheter is noted with tip in the distal SVC. Heart size and mediastinal contours are stable. Unchanged small right pleural effusion. Right apical cavitary lung mass with surrounding pleuroparenchymal scarring is unchanged. Left lung is clear. No acute abnormality. IMPRESSION: 1. No acute findings. 2. Persistent small right pleural effusion. 3. Unchanged right apical cavitary lung mass with surrounding pleuroparenchymal scarring. Electronically Signed   By: Signa Kell M.D.   On: 11/03/2023 08:16   Assessment/Plan [name] is a 62 y.o. 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    Sepsis due to healthcare acquired pneumonia-meeting criteria with tachycardia, fever, leukopenia (neutropenia), source is healthcare acquired pneumonia.  No evidence of endorgan dysfunction. -Inpatient admission to  progressive -Follow-up blood cultures -Empiric IV vancomycin and cefepime  COPD-stable on baseline 2 L, with no evidence of acute exacerbation.  Will continue her home inhalers and supplemental oxygen.  Hyperlipidemia-Lipitor  Thrombocytopenia-this has been an intermittent issue for her, presumably due to chemotherapy, etc.  Recurrent thrombocytopenia likely due to sepsis.  Will monitor closely with daily labs.  Chronic anemia-appears to be slightly worse than baseline, will trend with daily labs and transfuse as indicated to keep hemoglobin greater than 7  Stage IV non-small cell lung cancer-under the care of Dr. Arbutus Ped, Dr. [name], and Dr. [name] of radiation oncology.  No urgent oncologic issue, her primary oncologist Dr. Arbutus Ped has been added to treatment team.  Continue home dose Decadron  DVT prophylaxis: SCDs only due to thrombocytopenia    Code Status: Do not attempt resuscitation (DNR) PRE-ARREST INTERVENTIONS DESIRED  Consults called: None  Admission status: The appropriate patient status for this patient is INPATIENT. Inpatient status is judged to be reasonable and necessary in order to provide the required intensity of service to ensure the patient's safety. The patient's presenting symptoms, physical exam findings, and initial radiographic and laboratory data in the context of their chronic comorbidities is felt to place them at high risk for further clinical deterioration. Furthermore, it is not anticipated that the patient will be medically stable for discharge from the hospital within 2 midnights of admission.    I certify that at the point of admission it is my clinical judgment that the patient will require inpatient hospital care spanning beyond 2 midnights from the point of admission due to high intensity of service, high risk for further deterioration and high frequency of surveillance required  Time spent: 59 minutes  Jenesis Martin Sharlette Dense MD [facility] Pager  [phone]  If 7PM-7AM, please contact night-coverage [email or web address] Password [facility]  11/03/2023, 10:07 AM

## 2023-11-03 NOTE — ED Notes (Signed)
Pt difficult stick. Labs drawn but no cultures were collected ED provider notified. Will continue with antibiotics as ordered.

## 2023-11-03 NOTE — Plan of Care (Signed)

## 2023-11-03 NOTE — Sepsis Progress Note (Addendum)
Elink monitoring for the code sepsis protocol.   1610: Secure chat with bedside RN. Patient is a difficult stick, PA at bedside trying to get another IV access. LA and blood cultures not drawn prior to antibiotics being administered.

## 2023-11-03 NOTE — ED Notes (Signed)
Pt states she can not swallow pills and prefers liquid or crushed.

## 2023-11-03 NOTE — ED Triage Notes (Signed)
Pt BIB [facility] from home. Pt c/o abdominal pain for 3 days in the center of her stomach.  Pt tachycardic upon [facility] arrival 130/80 120HR 95% 2L [place] baseline 16RR

## 2023-11-03 NOTE — ED Notes (Signed)
Pt had her port access changed out for CT scan.  She has had diarrhea x 2 and she has gone for CT at this time.  Re-drew Dk green for lab.

## 2023-11-04 ENCOUNTER — Inpatient Hospital Stay (HOSPITAL_COMMUNITY): Payer: 59

## 2023-11-04 DIAGNOSIS — M7989 Other specified soft tissue disorders: Secondary | ICD-10-CM

## 2023-11-04 DIAGNOSIS — J189 Pneumonia, unspecified organism: Secondary | ICD-10-CM | POA: Diagnosis not present

## 2023-11-04 DIAGNOSIS — A419 Sepsis, unspecified organism: Secondary | ICD-10-CM | POA: Diagnosis not present

## 2023-11-04 LAB — PREPARE RBC (CROSSMATCH)

## 2023-11-04 LAB — CBC
HCT: 20 % — ABNORMAL LOW (ref 36.0–46.0)
Hemoglobin: 6.4 g/dL — CL (ref 12.0–15.0)
MCH: 27.9 pg (ref 26.0–34.0)
MCHC: 32 g/dL (ref 30.0–36.0)
MCV: 87.3 fL (ref 80.0–100.0)
Platelets: 50 10*3/uL — ABNORMAL LOW (ref 150–400)
RBC: 2.29 MIL/uL — ABNORMAL LOW (ref 3.87–5.11)
RDW: 16.5 % — ABNORMAL HIGH (ref 11.5–15.5)
WBC: 1 10*3/uL — CL (ref 4.0–10.5)
nRBC: 0 % (ref 0.0–0.2)

## 2023-11-04 LAB — BASIC METABOLIC PANEL
Anion gap: 9 (ref 5–15)
BUN: 15 mg/dL (ref 8–23)
CO2: 29 mmol/L (ref 22–32)
Calcium: 8.2 mg/dL — ABNORMAL LOW (ref 8.9–10.3)
Chloride: 98 mmol/L (ref 98–111)
Creatinine, Ser: 0.65 mg/dL (ref 0.44–1.00)
GFR, Estimated: >60 mL/min (ref >60)
Glucose, Bld: 108 mg/dL — ABNORMAL HIGH (ref 70–99)
Potassium: 3.8 mmol/L (ref 3.5–5.1)
Sodium: 136 mmol/L (ref 135–145)

## 2023-11-04 LAB — HEMOGLOBIN AND HEMATOCRIT, BLOOD
HCT: 21 % — ABNORMAL LOW (ref 36.0–46.0)
Hemoglobin: 7.3 g/dL — ABNORMAL LOW (ref 12.0–15.0)

## 2023-11-04 LAB — MRSA NEXT GEN BY PCR, NASAL: MRSA by PCR Next Gen: DETECTED — AB

## 2023-11-04 MED ORDER — SODIUM CHLORIDE 0.9% IV SOLUTION: Freq: Once | INTRAVENOUS | Status: AC

## 2023-11-04 NOTE — Progress Notes (Signed)
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  Patient is not a very good historian, seems to be mildly confused though she is completely oriented.  She c/o :  abdominal discomfort, had diarrhea when she was here last month.  Workup showed continued neutropenia, evidence of new pulmonary consolidation.  She was started on broad-spectrum sepsis antibiotics, and admitted to the hospitalist service.   Assessment & Plan:   Principal Problem:   Sepsis due to pneumonia (HCC)  Sepsis secondary to health care associated PNA: Patient presented with sepsis criteria ( tachycardia, fever, leukopenia (neutropenia), source is healthcare acquired pneumonia.  No evidence of endorgan dysfunction. Initiated on empiric antibiotics IV vancomycin and cefepime. Follow-up blood and urine cultures. Patient reports feeling improved.  Sepsis physiology is improving.  COPD: She remains stable on baseline 2 L, with no evidence of acute exacerbation.   Continue home inhalers, continue supplemental oxygen.   Hyperlipidemia: Continue Lipitor 20 mg daily.   Thrombocytopenia: This has been a chronic problem presumably due to chemotherapy, etc.   Recurrent thrombocytopenia likely due to sepsis. Monitor with labs.   Pancytopenia Chronic normochromic normocytic anemia: Likely due to lung cancer with mets and chemotherapy. Hemoglobin dropped to 6.4. Transfuse 1 unit PRBC. F/u Post transfusion CBC Monitor and keep hemoglobin above 7  Stage IV non-small cell lung  cancer: Patient is under the care of Dr. Arbutus Ped, Dr. [name], and Dr. [name] of radiation oncology.   No urgent oncologic issue, her primary oncologist Dr. Arbutus Ped has been added to treatment team.   Continue home dose Decadron     DVT prophylaxis:  SCDs Code Status: DNR Family Communication: No family at bed side Disposition Plan:   Consultants:  None  Procedures: None  Antimicrobials:  Anti-infectives (From admission, onward)    Start     Dose/Rate Route Frequency Ordered Stop   11/04/23 1100  Vancomycin (VANCOCIN) 1,500 mg in sodium chloride 0.9 % 500 mL IVPB        1,500 mg 250 mL/hr over 120 Minutes Intravenous Every 24 hours 11/03/23 1031     11/03/23 1200  ceFEPIme (MAXIPIME) 2 g in sodium chloride 0.9 % 100 mL IVPB        2 g 200 mL/hr over 30 Minutes Intravenous Every 8 hours 11/03/23 1031     11/03/23 1030  Vancomycin (VANCOCIN) 1,500 mg in sodium chloride 0.9 % 500 mL IVPB        1,500 mg 250 mL/hr over 120 Minutes Intravenous  Once 11/03/23 1027 11/03/23 1301   11/03/23 0330  ceFEPIme (MAXIPIME) 2 g in sodium chloride 0.9 % 100 mL IVPB        2 g 200 mL/hr over 30 Minutes Intravenous  Once 11/03/23 0318 11/03/23 0428   11/03/23 0330  metroNIDAZOLE (FLAGYL) IVPB 500 mg        500 mg 100 mL/hr over 60 Minutes Intravenous  Once 11/03/23 0318 11/03/23 0542      Subjective: Patient was seen and examined at bedside.  Overnight events noted. Patient seems severely deconditioned. She reports  feeling slightly better,  still feels tired and fatigued.  Objective: Vitals:   11/04/23 0419 11/04/23 0803 11/04/23 0956 11/04/23 1025  BP: 128/89  120/72 126/74  Pulse: 91  99 91  Resp: 20  14 14   Temp: 98.1 F (36.7 C)  97.7 F (36.5 C) 98.1 F (36.7 C)  TempSrc: Oral  Oral Oral  SpO2: 100% 93% 100% 100%  Weight:      Height:        Intake/Output Summary (Last 24 hours) at 11/04/2023 1204 Last data filed at 11/04/2023 0100 Gross per 24 hour  Intake 256.46 ml   Output 100 ml  Net 156.46 ml   Filed Weights   11/03/23 1318  Weight: 68.2 kg    Examination:  General exam: Appears calm and comfortable, deconditioned, not in any acute distress. Respiratory system: CTA bilaterally . Respiratory effort normal. RR 15 Cardiovascular system: S1 & S2 heard, RRR. No JVD, murmurs, rubs, gallops or clicks.  Gastrointestinal system: Abdomen is non distended, soft and non tender.Normal bowel sounds heard. Central nervous system: Alert and oriented X 3. No focal neurological deficits. Extremities: No edema, no cyanosis, no clubbing Skin: No rashes, lesions or ulcers Psychiatry: Judgement and insight appear normal. Mood & affect appropriate.     Data Reviewed: I have personally reviewed following labs and imaging studies  CBC: Recent Labs  Lab 11/03/23 0313 11/03/23 0447 11/04/23 0410  WBC 1.2*  --  1.0*  NEUTROABS 0.9*  --   --   HGB 8.9* 7.5* 6.4*  HCT 29.0* 22.0* 20.0*  MCV 90.3  --  87.3  PLT 65*  --  50*   Basic Metabolic Panel: Recent Labs  Lab 11/03/23 0313 11/03/23 0447 11/04/23 0410  NA 141 138 136  K 4.0 4.0 3.8  CL 97* 95* 98  CO2 35*  --  29  GLUCOSE 76 73 108*  BUN 14 12 15   CREATININE 0.80 0.80 0.65  CALCIUM 8.5*  --  8.2*   GFR: Estimated Creatinine Clearance: 64.8 mL/min (by C-G formula based on SCr of 0.65 mg/dL). Liver Function Tests: Recent Labs  Lab 11/03/23 0313  AST 20  ALT 15  ALKPHOS 60  BILITOT 0.8  PROT 5.8*  ALBUMIN 3.0*   Recent Labs  Lab 11/03/23 0313  LIPASE 22   No results for input(s): "AMMONIA" in the last 168 hours. Coagulation Profile: Recent Labs  Lab 11/03/23 0501  INR 1.3*   Cardiac Enzymes: No results for input(s): "CKTOTAL", "CKMB", "CKMBINDEX", "TROPONINI" in the last 168 hours. BNP (last 3 results) No results for input(s): "PROBNP" in the last 8760 hours. HbA1C: No results for input(s): "HGBA1C" in the last 72 hours. CBG: No results for input(s): "GLUCAP" in the last  168 hours. Lipid Profile: No results for input(s): "CHOL", "HDL", "LDLCALC", "TRIG", "CHOLHDL", "LDLDIRECT" in the last 72 hours. Thyroid Function Tests: No results for input(s): "TSH", "T4TOTAL", "FREET4", "T3FREE", "THYROIDAB" in the last 72 hours. Anemia Panel: No results for input(s): "VITAMINB12", "FOLATE", "FERRITIN", "TIBC", "IRON", "RETICCTPCT" in the last 72 hours. Sepsis Labs: Recent Labs  Lab 11/03/23 0452 11/03/23 0807  LATICACIDVEN 1.7 1.6    Recent Results (from the past 240 hours)  Resp panel by RT-PCR (RSV, Flu A&B, Covid) Anterior Nasal Swab     Status: None   Collection Time: 11/03/23  4:14 AM   Specimen: Anterior Nasal Swab  Result Value Ref Range Status   SARS Coronavirus 2 by RT PCR NEGATIVE NEGATIVE Final  Comment: (NOTE) SARS-CoV-2 target nucleic acids are NOT DETECTED.  The SARS-CoV-2 RNA is generally detectable in upper respiratory specimens during the acute phase of infection. The lowest concentration of SARS-CoV-2 viral copies this assay can detect is 138 copies/mL. A negative result does not preclude SARS-Cov-2 infection and should not be used as the sole basis for treatment or other patient management decisions. A negative result may occur with  improper specimen collection/handling, submission of specimen other than nasopharyngeal swab, presence of viral mutation(s) within the areas targeted by this assay, and inadequate number of viral copies(<138 copies/mL). A negative result must be combined with clinical observations, patient history, and epidemiological information. The expected result is Negative.  Fact Sheet for Patients:  BloggerCourse.com  Fact Sheet for Healthcare Providers:  SeriousBroker.it  This test is no t yet approved or cleared by the [place] FDA and  has been authorized for detection and/or diagnosis of SARS-CoV-2 by FDA under an Emergency Use Authorization (EUA). This  EUA will remain  in effect (meaning this test can be used) for the duration of the COVID-19 declaration under Section 564(b)(1) of the Act, 21 U.S.C.section 360bbb-3(b)(1), unless the authorization is terminated  or revoked sooner.       Influenza A by PCR NEGATIVE NEGATIVE Final   Influenza B by PCR NEGATIVE NEGATIVE Final    Comment: (NOTE) The Xpert Xpress SARS-CoV-2/FLU/RSV plus assay is intended as an aid in the diagnosis of influenza from Nasopharyngeal swab specimens and should not be used as a sole basis for treatment. Nasal washings and aspirates are unacceptable for Xpert Xpress SARS-CoV-2/FLU/RSV testing.  Fact Sheet for Patients: BloggerCourse.com  Fact Sheet for Healthcare Providers: SeriousBroker.it  This test is not yet approved or cleared by the [place] FDA and has been authorized for detection and/or diagnosis of SARS-CoV-2 by FDA under an Emergency Use Authorization (EUA). This EUA will remain in effect (meaning this test can be used) for the duration of the COVID-19 declaration under Section 564(b)(1) of the Act, 21 U.S.C. section 360bbb-3(b)(1), unless the authorization is terminated or revoked.     Resp Syncytial Virus by PCR NEGATIVE NEGATIVE Final    Comment: (NOTE) Fact Sheet for Patients: BloggerCourse.com  Fact Sheet for Healthcare Providers: SeriousBroker.it  This test is not yet approved or cleared by the [place] FDA and has been authorized for detection and/or diagnosis of SARS-CoV-2 by FDA under an Emergency Use Authorization (EUA). This EUA will remain in effect (meaning this test can be used) for the duration of the COVID-19 declaration under Section 564(b)(1) of the Act, 21 U.S.C. section 360bbb-3(b)(1), unless the authorization is terminated or revoked.  Performed at [facility], [address], [place], [place] [address]   Blood Culture (routine x 2)     Status: None (Preliminary result)   Collection Time: 11/03/23  4:57 AM   Specimen: BLOOD  Result Value Ref Range Status   Specimen Description   Final    BLOOD SITE NOT SPECIFIED Performed at [facility], [address] [place], [place] [address]    Special Requests   Final    BOTTLES DRAWN AEROBIC AND ANAEROBIC Blood Culture results may not be optimal due to an inadequate volume of blood received in culture bottles Performed at [facility], [address], [place], [place] [address]    Culture   Final    NO GROWTH 1 DAY Performed at [facility], [address] [place],  [place] [address]    Report Status PENDING  Incomplete  Blood Culture (routine x 2)     Status: None (Preliminary result)   Collection Time: 11/03/23  5:01 AM   Specimen: BLOOD LEFT HAND  Result Value Ref Range Status   Specimen Description   Final    BLOOD LEFT HAND Performed at [facility], [address] [place], [place] [address]    Special Requests   Final    BOTTLES DRAWN AEROBIC AND ANAEROBIC Blood Culture results may not be optimal due to an inadequate volume of blood received in culture bottles Performed at [facility], [address] [place], [place] [address]    Culture   Final    NO GROWTH 1 DAY Performed at [facility], [address], [place], [place] [address]    Report Status PENDING  Incomplete    Radiology Studies: CT Angio Chest/Abd/Pel for Dissection W and/or Wo Contrast Result Date: 11/03/2023 CLINICAL DATA:  Abdominal pain, tachycardia, pulsatile abdominal mass history of lung cancer * Tracking Code: BO * EXAM: CT ANGIOGRAPHY CHEST, ABDOMEN AND PELVIS TECHNIQUE: Non-contrast CT of the chest was initially obtained. Multidetector CT imaging through the chest, abdomen and pelvis was performed using the standard protocol during bolus administration  of intravenous contrast. Multiplanar reconstructed images and MIPs were obtained and reviewed to evaluate the vascular anatomy. RADIATION DOSE REDUCTION: This exam was performed according to the departmental dose-optimization program which includes automated exposure control, adjustment of the mA and/or kV according to patient size and/or use of iterative reconstruction technique. CONTRAST:  OMNIPAQUE IOHEXOL 350 MG/ML SOLN COMPARISON:  CT chest, 09/19/2023, CT abdomen pelvis, 10/13/2023 FINDINGS: CTA CHEST FINDINGS VASCULAR Aorta: Satisfactory opacification of the aorta. Normal contour and caliber of the thoracic aorta. No evidence of aneurysm, dissection, or other acute aortic pathology. Moderate aortic atherosclerosis Cardiovascular: Right chest port catheter. No evidence of pulmonary embolism on limited non-tailored examination. Normal heart size. Enlargement of the main pulmonary artery measuring up to 3.9 cm in caliber. No pericardial effusion. Review of the MIP images confirms the above findings. NON VASCULAR Mediastinum/Nodes: No enlarged mediastinal, hilar, or axillary lymph nodes. Thyroid gland, trachea, and esophagus demonstrate no significant findings. Lungs/Pleura: Small right pleural effusion, similar to prior examination. Perhaps slightly diminished size of a cavitary mass of the right upper lobe measuring 4.9 x 3.2 cm, previously 4.8 x 3.6 cm, with somewhat increased cavitation (series 12, image 34). New, dense nodular consolidation in the anterior left upper lobe measuring 2.4 x 1.8 cm (series 12, image 55). Diffuse bilateral bronchial wall thickening. Bandlike scarring of the bilateral lung bases. Musculoskeletal: No chest wall abnormality. No acute osseous findings. Review of the MIP images confirms the above findings. CTA ABDOMEN AND PELVIS FINDINGS VASCULAR Normal contour and caliber of the abdominal aorta. No evidence of aneurysm, dissection, or other acute aortic pathology. Standard  branching pattern of the abdominal aorta with solitary bilateral renal arteries. Moderate aortic atherosclerosis. Review of the MIP images confirms the above findings. NON-VASCULAR Hepatobiliary: No significant change in hypodense lesions of the posterior liver, largest measuring 1.9 x 1.5 cm (series 22, image 163). No gallstones, gallbladder wall thickening, or biliary dilatation. Pancreas: Unremarkable. No pancreatic ductal dilatation or surrounding inflammatory changes. Spleen: Normal in size without significant abnormality. Adrenals/Urinary Tract: Adrenal glands are unremarkable. Simple, benign renal cortical cysts, for which no further follow-up or characterization is required. Kidneys are otherwise normal, without renal calculi, solid lesion, or hydronephrosis.  Bladder is unremarkable. Stomach/Bowel: Stomach is within normal limits. Appendix not clearly visualized and may be surgically absent. No evidence of bowel wall thickening, distention, or inflammatory changes. Lymphatic: No enlarged abdominal or pelvic lymph nodes. Reproductive: Status post hysterectomy Other: No abdominal wall hernia or abnormality. No ascites. Musculoskeletal: No acute osseous findings. IMPRESSION: 1. Normal contour and caliber of the thoracic and abdominal aorta. No evidence of aneurysm, dissection, or other acute aortic pathology. Moderate aortic atherosclerosis. 2. Perhaps slightly diminished size of a cavitary mass of the right upper lobe with somewhat increased cavitation. Findings are consistent with known primary lung malignancy. 3. New, dense nodular consolidation in the anterior left upper lobe measuring 2.4 x 1.8 cm. Findings are most consistent with infection rapid interval development. 4. Small right pleural effusion, similar to prior examination. 5. No significant change in hypodense lesions of the posterior liver, largest measuring 1.9 x 1.5 cm. These remain suspicious for metastases. 6. Enlargement of the main pulmonary  artery, as can be seen in pulmonary hypertension. Aortic Atherosclerosis (ICD10-I70.0). Electronically Signed   By: Jearld Lesch M.D.   On: 11/03/2023 09:28   DG Chest Portable 1 View Result Date: 11/03/2023 CLINICAL DATA:  Sepsis. Abdominal pain for 3 days. History of non-small cell lung cancer. EXAM: PORTABLE CHEST 1 VIEW COMPARISON:  10/12/2023 FINDINGS: Right chest wall porta catheter is noted with tip in the distal SVC. Heart size and mediastinal contours are stable. Unchanged small right pleural effusion. Right apical cavitary lung mass with surrounding pleuroparenchymal scarring is unchanged. Left lung is clear. No acute abnormality. IMPRESSION: 1. No acute findings. 2. Persistent small right pleural effusion. 3. Unchanged right apical cavitary lung mass with surrounding pleuroparenchymal scarring. Electronically Signed   By: Signa Kell M.D.   On: 11/03/2023 08:16   Scheduled Meds:  arformoterol  15 mcg Nebulization BID   And   umeclidinium bromide  1 puff Inhalation Daily   aspirin  81 mg Oral Daily   atorvastatin  20 mg Oral Daily   Chlorhexidine Gluconate Cloth  6 each Topical Daily   dexamethasone  4 mg Oral BID   levETIRAcetam  750 mg Oral BID   magnesium oxide  400 mg Oral Q0600   metoprolol tartrate  25 mg Oral Daily   montelukast  10 mg Oral QHS   pantoprazole  40 mg Oral Daily   sertraline  50 mg Oral Daily   triamterene-hydrochlorothiazide  1 tablet Oral Daily   Continuous Infusions:  ceFEPime (MAXIPIME) IV 2 g (11/04/23 0419)   vancomycin       LOS: 1 day    Time spent: 50 Mins    Willeen Niece, MD [facility]   If 7PM-7AM, please contact night-coverage

## 2023-11-04 NOTE — Progress Notes (Signed)
Upper extremity venous duplex completed. Please see CV Procedures for preliminary results.  Shona Simpson, RVT 11/04/23 3:09 PM

## 2023-11-04 NOTE — Progress Notes (Signed)
Made Chinita Greenland Np aware that the pt's am labs are hgb 6.4, wbc 1.0. No new orders at this time plan of care ongoing.

## 2023-11-05 ENCOUNTER — Ambulatory Visit: Payer: 59 | Admitting: Internal Medicine

## 2023-11-05 ENCOUNTER — Other Ambulatory Visit: Payer: 59

## 2023-11-05 ENCOUNTER — Ambulatory Visit: Payer: 59

## 2023-11-05 DIAGNOSIS — A419 Sepsis, unspecified organism: Secondary | ICD-10-CM | POA: Diagnosis not present

## 2023-11-05 DIAGNOSIS — J189 Pneumonia, unspecified organism: Secondary | ICD-10-CM | POA: Diagnosis not present

## 2023-11-05 LAB — CBC
HCT: 21.5 % — ABNORMAL LOW (ref 36.0–46.0)
Hemoglobin: 7 g/dL — ABNORMAL LOW (ref 12.0–15.0)
MCH: 28.6 pg (ref 26.0–34.0)
MCHC: 32.6 g/dL (ref 30.0–36.0)
MCV: 87.8 fL (ref 80.0–100.0)
Platelets: 31 10*3/uL — ABNORMAL LOW (ref 150–400)
RBC: 2.45 MIL/uL — ABNORMAL LOW (ref 3.87–5.11)
RDW: 15.6 % — ABNORMAL HIGH (ref 11.5–15.5)
WBC: 0.4 10*3/uL — CL (ref 4.0–10.5)
nRBC: 0 % (ref 0.0–0.2)

## 2023-11-05 LAB — MAGNESIUM: Magnesium: 1.3 mg/dL — ABNORMAL LOW (ref 1.7–2.4)

## 2023-11-05 LAB — DIFFERENTIAL
Abs Immature Granulocytes: 0.01 10*3/uL (ref 0.00–0.07)
Basophils Absolute: 0 10*3/uL (ref 0.0–0.1)
Basophils Relative: 0 %
Eosinophils Absolute: 0 10*3/uL (ref 0.0–0.5)
Eosinophils Relative: 0 %
Immature Granulocytes: 2 %
Lymphocytes Relative: 23 %
Lymphs Abs: 0.1 10*3/uL — ABNORMAL LOW (ref 0.7–4.0)
Monocytes Absolute: 0.1 10*3/uL (ref 0.1–1.0)
Monocytes Relative: 12 %
Neutro Abs: 0.3 10*3/uL — CL (ref 1.7–7.7)
Neutrophils Relative %: 63 %

## 2023-11-05 LAB — HEMOGLOBIN AND HEMATOCRIT, BLOOD
HCT: 26.2 % — ABNORMAL LOW (ref 36.0–46.0)
Hemoglobin: 8.7 g/dL — ABNORMAL LOW (ref 12.0–15.0)

## 2023-11-05 LAB — PHOSPHORUS: Phosphorus: 2.6 mg/dL (ref 2.5–4.6)

## 2023-11-05 LAB — PREPARE RBC (CROSSMATCH)

## 2023-11-05 MED ORDER — FILGRASTIM 300 MCG/ML IJ SOLN
300.0000 ug | Freq: Every day | INTRAMUSCULAR | Status: DC
Start: 2023-11-05 — End: 2023-11-08
  Administered 2023-11-05 – 2023-11-08 (×4): 300 ug via SUBCUTANEOUS
  Filled 2023-11-05 (×4): qty 1

## 2023-11-05 MED ORDER — MAGNESIUM SULFATE 2 GM/50ML IV SOLN
2.0000 g | Freq: Once | INTRAVENOUS | Status: AC
  Administered 2023-11-05: 2 g via INTRAVENOUS
  Filled 2023-11-05: qty 50

## 2023-11-05 MED ORDER — CHLORHEXIDINE GLUCONATE CLOTH 2 % EX PADS
6.0000 | MEDICATED_PAD | Freq: Every day | CUTANEOUS | Status: AC
  Administered 2023-11-06 – 2023-11-09 (×4): 6 via TOPICAL

## 2023-11-05 MED ORDER — MUPIROCIN 2 % EX OINT
1.0000 | TOPICAL_OINTMENT | Freq: Two times a day (BID) | CUTANEOUS | Status: DC
  Administered 2023-11-05 – 2023-11-09 (×7): 1 via NASAL
  Filled 2023-11-05 (×3): qty 22

## 2023-11-05 MED ORDER — SODIUM CHLORIDE 0.9% IV SOLUTION: Freq: Once | INTRAVENOUS | Status: DC

## 2023-11-05 NOTE — Progress Notes (Signed)
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  Show started on broad-spectrum antibiotics.  Assessment & Plan:   Sepsis: Present on admission Healthcare associate pneumonia -Currently on cefepime and vancomycin.  Blood cultures are negative so far. -Hemodynamically improving.  COPD Chronic respiratory failure with hypoxia -Respiratory status remained stable at baseline 2 L oxygen via nasal cannula.  No evidence of COPD exacerbation -Continue current inhaled regimen  Stage IV non-small cell lung cancer Pancytopenia -Follows up with oncology/neuro-oncology and radiation oncology as an outpatient.  Continue home dose Decadron.  Currently on outpatient palliative chemotherapy overall prognosis is guarded to poor. -Received 1 unit of packed red cells on 11/04/2023 for hemoglobin of 6.4.  Hemoglobin 7 today.  Has persistent neutropenia and thrombocytopenia.  I have asked oncology to weigh in.  Follow recommendations.  Hypomagnesemia -Replace.  Repeat a.m. labs  Hypertension Hyperlipidemia -Continue current regimen  Depression--continue sertraline  Physical deconditioning -PT eval   DVT prophylaxis: SCDs Code Status: DNR Family Communication: None at bedside Disposition Plan: Status is: Inpatient Remains inpatient appropriate because: Of severity of illness  Consultants: Consult oncology  Procedures: None  Antimicrobials:  Anti-infectives (From admission, onward)    Start     Dose/Rate Route Frequency Ordered Stop   11/04/23 1100   Vancomycin (VANCOCIN) 1,500 mg in sodium chloride 0.9 % 500 mL IVPB        1,500 mg 250 mL/hr over 120 Minutes Intravenous Every 24 hours 11/03/23 1031     11/03/23 1200  ceFEPIme (MAXIPIME) 2 g in sodium chloride 0.9 % 100 mL IVPB        2 g 200 mL/hr over 30 Minutes Intravenous Every 8 hours 11/03/23 1031     11/03/23 1030  Vancomycin (VANCOCIN) 1,500 mg in sodium chloride 0.9 % 500 mL IVPB        1,500 mg 250 mL/hr over 120 Minutes Intravenous  Once 11/03/23 1027 11/03/23 1301   11/03/23 0330  ceFEPIme (MAXIPIME) 2 g in sodium chloride 0.9 % 100 mL IVPB        2 g 200 mL/hr over 30 Minutes Intravenous  Once 11/03/23 0318 11/03/23 0428   11/03/23 0330  metroNIDAZOLE (FLAGYL) IVPB 500 mg        500 mg 100 mL/hr over 60 Minutes Intravenous  Once 11/03/23 0318 11/03/23 0542        Subjective: Patient seen and examined at bedside.  Feels slightly better.  Still feels very weak.  No fever, seizures, vomiting or agitation reported.  Objective: Vitals:   11/04/23 2041 11/05/23 0409 11/05/23 0741 11/05/23 0953  BP:  (!) 131/92  (!) 131/92  Pulse:  78  78  Resp:  17    Temp:  98.3 F (36.8 C)    TempSrc:  Oral    SpO2: 99% 100% 100%   Weight:      Height:        Intake/Output Summary (Last 24 hours) at 11/05/2023 1057 Last data filed at 11/05/2023 0600 Gross per 24 hour  Intake 1021.43 ml  Output 600 ml  Net  421.43 ml   Filed Weights   11/03/23 1318  Weight: 68.2 kg    Examination:  General exam: Appears calm and comfortable.  On 2 L oxygen by nasal cannula.  Looks chronically ill and deconditioned. Respiratory system: Bilateral decreased breath sounds at bases with scattered crackles Cardiovascular system: S1 & S2 heard, Rate controlled Gastrointestinal system: Abdomen is nondistended, soft and nontender. Normal bowel sounds heard. Extremities: No cyanosis, clubbing, edema  Central nervous system: Awake, extremely slow to respond.  Poor historian.  No focal  neurological deficits. Moving extremities Skin: No rashes, lesions or ulcers Psychiatry: Flat affect.  Not agitated.   Data Reviewed: I have personally reviewed following labs and imaging studies  CBC: Recent Labs  Lab 11/03/23 0313 11/03/23 0447 11/04/23 0410 11/04/23 1605 11/05/23 0212  WBC 1.2*  --  1.0*  --  0.4*  NEUTROABS 0.9*  --   --   --   --   HGB 8.9* 7.5* 6.4* 7.3* 7.0*  HCT 29.0* 22.0* 20.0* 21.0* 21.5*  MCV 90.3  --  87.3  --  87.8  PLT 65*  --  50*  --  31*   Basic Metabolic Panel: Recent Labs  Lab 11/03/23 0313 11/03/23 0447 11/04/23 0410 11/05/23 0212  NA 141 138 136  --   K 4.0 4.0 3.8  --   CL 97* 95* 98  --   CO2 35*  --  29  --   GLUCOSE 76 73 108*  --   BUN 14 12 15   --   CREATININE 0.80 0.80 0.65  --   CALCIUM 8.5*  --  8.2*  --   MG  --   --   --  1.3*  PHOS  --   --   --  2.6   GFR: Estimated Creatinine Clearance: 64.8 mL/min (by C-G formula based on SCr of 0.65 mg/dL). Liver Function Tests: Recent Labs  Lab 11/03/23 0313  AST 20  ALT 15  ALKPHOS 60  BILITOT 0.8  PROT 5.8*  ALBUMIN 3.0*   Recent Labs  Lab 11/03/23 0313  LIPASE 22   No results for input(s): "AMMONIA" in the last 168 hours. Coagulation Profile: Recent Labs  Lab 11/03/23 0501  INR 1.3*   Cardiac Enzymes: No results for input(s): "CKTOTAL", "CKMB", "CKMBINDEX", "TROPONINI" in the last 168 hours. BNP (last 3 results) No results for input(s): "PROBNP" in the last 8760 hours. HbA1C: No results for input(s): "HGBA1C" in the last 72 hours. CBG: No results for input(s): "GLUCAP" in the last 168 hours. Lipid Profile: No results for input(s): "CHOL", "HDL", "LDLCALC", "TRIG", "CHOLHDL", "LDLDIRECT" in the last 72 hours. Thyroid Function Tests: No results for input(s): "TSH", "T4TOTAL", "FREET4", "T3FREE", "THYROIDAB" in the last 72 hours. Anemia Panel: No results for input(s): "VITAMINB12", "FOLATE", "FERRITIN", "TIBC", "IRON", "RETICCTPCT" in the last 72  hours. Sepsis Labs: Recent Labs  Lab 11/03/23 0452 11/03/23 0807  LATICACIDVEN 1.7 1.6    Recent Results (from the past 240 hours)  Resp panel by RT-PCR (RSV, Flu A&B, Covid) Anterior Nasal Swab     Status: None   Collection Time: 11/03/23  4:14 AM   Specimen: Anterior Nasal Swab  Result Value Ref Range Status   SARS Coronavirus 2 by RT PCR NEGATIVE NEGATIVE Final    Comment: (NOTE) SARS-CoV-2 target nucleic acids are NOT DETECTED.  The SARS-CoV-2 RNA is generally detectable in upper respiratory specimens during the acute phase of infection. The lowest concentration of SARS-CoV-2 viral copies this  assay can detect is 138 copies/mL. A negative result does not preclude SARS-Cov-2 infection and should not be used as the sole basis for treatment or other patient management decisions. A negative result may occur with  improper specimen collection/handling, submission of specimen other than nasopharyngeal swab, presence of viral mutation(s) within the areas targeted by this assay, and inadequate number of viral copies(<138 copies/mL). A negative result must be combined with clinical observations, patient history, and epidemiological information. The expected result is Negative.  Fact Sheet for Patients:  BloggerCourse.com  Fact Sheet for Healthcare Providers:  SeriousBroker.it  This test is no t yet approved or cleared by the [place] FDA and  has been authorized for detection and/or diagnosis of SARS-CoV-2 by FDA under an Emergency Use Authorization (EUA). This EUA will remain  in effect (meaning this test can be used) for the duration of the COVID-19 declaration under Section 564(b)(1) of the Act, 21 U.S.C.section 360bbb-3(b)(1), unless the authorization is terminated  or revoked sooner.       Influenza A by PCR NEGATIVE NEGATIVE Final   Influenza B by PCR NEGATIVE NEGATIVE Final    Comment: (NOTE) The Xpert Xpress  SARS-CoV-2/FLU/RSV plus assay is intended as an aid in the diagnosis of influenza from Nasopharyngeal swab specimens and should not be used as a sole basis for treatment. Nasal washings and aspirates are unacceptable for Xpert Xpress SARS-CoV-2/FLU/RSV testing.  Fact Sheet for Patients: BloggerCourse.com  Fact Sheet for Healthcare Providers: SeriousBroker.it  This test is not yet approved or cleared by the [place] FDA and has been authorized for detection and/or diagnosis of SARS-CoV-2 by FDA under an Emergency Use Authorization (EUA). This EUA will remain in effect (meaning this test can be used) for the duration of the COVID-19 declaration under Section 564(b)(1) of the Act, 21 U.S.C. section 360bbb-3(b)(1), unless the authorization is terminated or revoked.     Resp Syncytial Virus by PCR NEGATIVE NEGATIVE Final    Comment: (NOTE) Fact Sheet for Patients: BloggerCourse.com  Fact Sheet for Healthcare Providers: SeriousBroker.it  This test is not yet approved or cleared by the [place] FDA and has been authorized for detection and/or diagnosis of SARS-CoV-2 by FDA under an Emergency Use Authorization (EUA). This EUA will remain in effect (meaning this test can be used) for the duration of the COVID-19 declaration under Section 564(b)(1) of the Act, 21 U.S.C. section 360bbb-3(b)(1), unless the authorization is terminated or revoked.  Performed at [facility], [address] [place], [place] [address]   Blood Culture (routine x 2)     Status: None (Preliminary result)   Collection Time: 11/03/23  4:57 AM   Specimen: BLOOD  Result Value Ref Range Status   Specimen Description   Final    BLOOD SITE NOT SPECIFIED Performed at [facility], [address], [place], [place] [address]    Special Requests   Final    BOTTLES DRAWN  AEROBIC AND ANAEROBIC Blood Culture results may not be optimal due to an inadequate volume of blood received in culture bottles Performed at [facility], [address] [place], [place] [address]    Culture   Final    NO GROWTH 2 DAYS Performed at [facility], [address] [place], [place] [address]    Report Status PENDING  Incomplete  Blood Culture (routine x 2)     Status: None (Preliminary result)   Collection Time: 11/03/23  5:01 AM  Specimen: BLOOD LEFT HAND  Result Value Ref Range Status   Specimen Description   Final    BLOOD LEFT HAND Performed at [facility], [address], [place], [place] [address]    Special Requests   Final    BOTTLES DRAWN AEROBIC AND ANAEROBIC Blood Culture results may not be optimal due to an inadequate volume of blood received in culture bottles Performed at [facility], [address], [place], [place] [address]    Culture   Final    NO GROWTH 2 DAYS Performed at [facility], [address] [place], [place] [address]    Report Status PENDING  Incomplete  MRSA Next Gen by PCR, Nasal     Status: Abnormal   Collection Time: 11/04/23 10:48 AM   Specimen: Nasal Mucosa; Nasal Swab  Result Value Ref Range Status   MRSA by PCR Next Gen DETECTED (A) NOT DETECTED Final    Comment: CRITICAL RESULT CALLED TO, READ BACK BY AND VERIFIED WITH: Manuella Ghazi RN @ 1150 ON 11/04/23 CAL (NOTE) The GeneXpert MRSA Assay (FDA approved for NASAL specimens only), is one component of a comprehensive MRSA colonization surveillance program. It is not intended to diagnose MRSA infection nor to guide or monitor treatment for MRSA infections. Test performance is not FDA approved in patients less than 64 years old. Performed at [facility], [address] [place], [place] [address]          Radiology Studies: VAS [place] UPPER EXTREMITY VENOUS DUPLEX Result Date: 11/04/2023 UPPER  VENOUS STUDY  Patient Name:  [name]  Date of Exam:   11/04/2023 Medical Rec #: [id number]       Accession #:    [id number] Date of Birth: [birth year]        Patient Gender: F Patient Age:   20 years Exam Location:  [facility] Procedure:      VAS [place] UPPER EXTREMITY VENOUS DUPLEX Referring Phys: PARDEEP KHATRI --------------------------------------------------------------------------------  Indications: Swelling, and Pain Risk Factors: Cancer Breast past pregnancy. Comparison Study: None Performing Technologist: Shona Simpson  Examination Guidelines: A complete evaluation includes B-mode imaging, spectral Doppler, color Doppler, and power Doppler as needed of all accessible portions of each vessel. Bilateral testing is considered an integral part of a complete examination. Limited examinations for reoccurring indications may be performed as noted.  Right Findings: +----------+------------+---------+-----------+----------+-------+ RIGHT     CompressiblePhasicitySpontaneousPropertiesSummary +----------+------------+---------+-----------+----------+-------+ IJV           Full       Yes       Yes                      +----------+------------+---------+-----------+----------+-------+ Subclavian    Full       Yes       Yes                      +----------+------------+---------+-----------+----------+-------+ Axillary      Full       Yes       Yes                      +----------+------------+---------+-----------+----------+-------+ Brachial      Full       Yes       Yes                      +----------+------------+---------+-----------+----------+-------+ Radial  Full       Yes       Yes                      +----------+------------+---------+-----------+----------+-------+ Ulnar         Full       Yes       Yes                      +----------+------------+---------+-----------+----------+-------+ Cephalic      Full                 Yes                       +----------+------------+---------+-----------+----------+-------+ Basilic       Full                 Yes                      +----------+------------+---------+-----------+----------+-------+  Left Findings: +----------+------------+---------+-----------+----------+-------+ LEFT      CompressiblePhasicitySpontaneousPropertiesSummary +----------+------------+---------+-----------+----------+-------+ Subclavian    Full       Yes       Yes                      +----------+------------+---------+-----------+----------+-------+  Summary:  Right: No evidence of deep vein thrombosis in the upper extremity. No evidence of superficial vein thrombosis in the upper extremity.  Left: No evidence of thrombosis in the subclavian.  *See table(s) above for measurements and observations.  Diagnosing physician: Coral Else MD Electronically signed by Coral Else MD on 11/04/2023 at 7:21:59 PM.    Final         Scheduled Meds:  sodium chloride   Intravenous Once   arformoterol  15 mcg Nebulization BID   And   umeclidinium bromide  1 puff Inhalation Daily   aspirin  81 mg Oral Daily   atorvastatin  20 mg Oral Daily   Chlorhexidine Gluconate Cloth  6 each Topical Daily   Chlorhexidine Gluconate Cloth  6 each Topical Q0600   dexamethasone  4 mg Oral BID   levETIRAcetam  750 mg Oral BID   magnesium oxide  400 mg Oral Q0600   metoprolol tartrate  25 mg Oral Daily   montelukast  10 mg Oral QHS   mupirocin ointment  1 Application Nasal BID   pantoprazole  40 mg Oral Daily   sertraline  50 mg Oral Daily   triamterene-hydrochlorothiazide  1 tablet Oral Daily   Continuous Infusions:  ceFEPime (MAXIPIME) IV 2 g (11/05/23 0341)   vancomycin 1,500 mg (11/05/23 1012)          Glade Lloyd, MD [facility] 11/05/2023, 10:57 AM

## 2023-11-05 NOTE — TOC Initial Note (Signed)
Transition of Care [facility]) - Initial/Assessment Note    Patient Details  Name: RAMSHA COLLER MRN: [id number] Date of Birth: May 18, 1961  Transition of Care [facility]) CM/SW Contact:    Larrie Kass, LCSW Phone Number: 11/05/2023, 3:38 PM  Clinical Narrative:                 Pt is active with [facility] for HHPT, OT, RN. TOC to follow.   Expected Discharge Plan: Home w Home Health Services Barriers to Discharge: Continued Medical Work up   Patient Goals and CMS Choice            Expected Discharge Plan and Services                                              Prior Living Arrangements/Services                       Activities of Daily Living   ADL Screening (condition at time of admission) Independently performs ADLs?: Yes (appropriate for developmental age) Is the patient deaf or have difficulty hearing?: No Does the patient have difficulty seeing, even when wearing glasses/contacts?: No Does the patient have difficulty concentrating, remembering, or making decisions?: No  Permission Sought/Granted                  Emotional Assessment              Admission diagnosis:  Sepsis due to pneumonia (HCC) [J18.9, A41.9] Sepsis, due to unspecified organism, unspecified whether acute organ dysfunction present [facility]) [A41.9] Patient Active Problem List   Diagnosis Date Noted   Sepsis due to pneumonia (HCC) 11/03/2023   Weakness 10/13/2023   Thrombocytopenia (HCC) 10/13/2023   Malignant neoplasm of right lung (HCC) 10/13/2023   Sepsis (HCC) 10/13/2023   Fever and neutropenia (HCC) 10/12/2023   Goals of care, counseling/discussion 09/27/2023   Acute metabolic encephalopathy 09/17/2023   Cerebral edema (HCC) 08/31/2023   Acute metabolic encephalopathy 08/28/2023   Community acquired pneumonia of right upper lobe of lung 08/28/2023   AKI (acute kidney injury) (HCC) 08/27/2023   Lung cancer metastatic to brain (HCC) 08/08/2023    Port-A-Cath in place 08/07/2023   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) 07/24/2023   Squamous cell lung cancer, 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   Frontal mass of brain 06/26/2023   Mass of upper lobe of right lung 06/26/2023   DNR (do not resuscitate) 06/26/2023   Cirrhosis (HCC) 03/14/2022   Benign neoplasm of ascending colon    Anemia 01/10/2019   Chronic cough 11/26/2017   Depression    Obesity (BMI 30-39.9) 01/10/2017   Dyspnea on exertion 11/09/2016   Chronic respiratory failure with hypoxia and hypercapnia (HCC) 10/29/2016   Depression with anxiety 08/07/2016   Hot flashes 02/01/2016   Breast cancer of upper-outer quadrant of right female breast (HCC) 08/04/2015   GERD (gastroesophageal reflux disease) 12/21/2012   Hyperlipidemia 12/21/2012   Solitary pulmonary nodule on lung CT 12/21/2012   SICKLE-CELL TRAIT 04/15/2008   Sarcoidosis (HCC) 01/10/2007   Hypertension 01/10/2007   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  01/10/2007   BACK PAIN, LOW 01/10/2007   PCP:  [name]., FNP Pharmacy:   [id number] - [place] ([place]), [place] - [address] [place] (  [place]) [place] [address] Phone: [phone] Fax: [phone]  [facility] - [place], [place] - [address] [place] [address] Phone: [phone] Fax: [phone]     Social Drivers of Health (SDOH) Social History: SDOH Screenings   Food Insecurity: No Food Insecurity (11/03/2023)  Housing: Unknown (11/03/2023)  Transportation Needs: No Transportation Needs (11/03/2023)  Utilities: Not At Risk (11/03/2023)  Alcohol Screen: Low Risk  (07/23/2023)  Depression (PHQ2-9): Low Risk  (07/23/2023)  Tobacco Use: Medium Risk (11/03/2023)   SDOH Interventions:     Readmission Risk Interventions    10/18/2023   12:35 PM 09/18/2023    1:52 PM  Readmission Risk Prevention Plan  Transportation Screening  Complete Complete  Medication Review (RN Care Manager) Complete Referral to Pharmacy  PCP or Specialist appointment within 3-5 days of discharge Complete Complete  HRI or Home Care Consult Complete Complete  SW Recovery Care/Counseling Consult Complete Complete  Palliative Care Screening Complete Complete  Skilled Nursing Facility Not Applicable Not Applicable

## 2023-11-05 NOTE — Progress Notes (Signed)
Resumed care of patient from [place], [place]. Agree with her previous assessment. Patient currently sitting EOB with no complaints. Awaiting completion of IV abx, for blood admin. Patient updated as to plan of care.

## 2023-11-05 NOTE — Plan of Care (Signed)
  Problem: Clinical Measurements: Goal: Will remain free from infection Outcome: Progressing   Problem: Activity: Goal: Risk for activity intolerance will decrease Outcome: Progressing   Problem: Coping: Goal: Level of anxiety will decrease Outcome: Progressing   Problem: Pain Management: Goal: General experience of comfort will improve Outcome: Progressing   Problem: Safety: Goal: Ability to remain free from injury will improve Outcome: Progressing

## 2023-11-05 NOTE — Progress Notes (Addendum)
[name]   DOB:June 27, 1961   GE#:[id number]      ASSESSMENT & PLAN:  1.  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 -Currently receiving palliative systemic chemoimmunotherapy with carboplatin, paclitaxel, and Keytruda.  First dose was given 09/28/2023. - Status post SBRT to lung, bone, and brain. - Continue Decadron per home dose -Follows closely with medical oncology Dr. Shirline Frees and Dr. [name].  2.  Pancytopenia -Likely multifactorial due to to recent chemo and immunotherapy, malignancy, chronic disease -CBC done today 11/05/23:  0.4>7.0<31K  -WBC low 0.4 today, pending differential add-on with repeat in am.  Patient did not receive Neulasta as she was no-show for injection appt on 10/30/23.  -Administer Neupogen 300 mcg inj daily until ANC greather than 1500. Please give first dose today 11/05/23. -Transfuse PRBC for Hgb less than 7.0.  Status post 1 unit prbc given 12/22 for hgb 6.4. -Recommend additional 1 unit prbc transfusion today 11/05/23. -Transfuse platelets for counts and 20K -Monitor CBC with differential daily  3.  Pneumonia/sepsis -On broad-spectrum antibiotics with cefepime and vancomycin, continue as ordered -per CT angio chest done 11/03/23 new dense nodular consolidation in anterior LUL most consistent with infection.  -Monitor fever curve, afebrile temp 98.1 today  4.  History of breast carcinoma -Right breast cancer in 2016 and she is status post lumpectomy - Also received adjuvant radiotherapy and hormonal therapy with anastrozole - Followed Dr. Johny Shock medical oncology  5.  Hypertension - Continue antihypertensives - Monitor BP closely - Medicine following  Code Status DNR-INTERVENTIONS    Subjective:  Patient seen awake and alert sitting up in chair at bedside. Weak and  ill-appearing. Reports fatigue. No acute distress noted, no acute complaints offered.   Objective:  Vitals:   11/05/23 0741 11/05/23 0953  BP:  (!) 131/92  Pulse:  78  Resp:    Temp:    SpO2: 100%      Intake/Output Summary (Last 24 hours) at 11/05/2023 1144 Last data filed at 11/05/2023 0600 Gross per 24 hour  Intake 1021.43 ml  Output 600 ml  Net 421.43 ml     REVIEW OF SYSTEMS:   Constitutional: +fatigue, Denies fevers, chills or abnormal night sweats Eyes: Denies blurriness of vision, double vision or watery eyes Ears, nose, mouth, throat, and face: Denies mucositis or sore throat Respiratory: Denies cough, dyspnea or wheezes Cardiovascular: Denies palpitation, chest discomfort or lower extremity swelling Gastrointestinal:  Denies nausea, heartburn or change in bowel habits Skin: Denies abnormal skin rashes Lymphatics: Denies new lymphadenopathy or easy bruising Neurological: Denies numbness, tingling or new weaknesses Behavioral/Psych: Mood is stable, no new changes  All other systems were reviewed with the patient and are negative.  PHYSICAL EXAMINATION: ECOG PERFORMANCE STATUS: 3 - Symptomatic, >50% confined to bed  Vitals:   11/05/23 0741 11/05/23 0953  BP:  (!) 131/92  Pulse:  78  Resp:    Temp:    SpO2: 100%    Filed Weights   11/03/23 1318  Weight: 150 lb 5.7 oz (68.2 kg)    GENERAL: alert, +ill-appearing, no distress and comfortable SKIN: skin color, texture, turgor are normal, no rashes or significant lesions EYES: normal, conjunctiva are pink and non-injected, sclera clear OROPHARYNX: no exudate, no erythema and lips, buccal mucosa, and tongue normal  NECK: supple, thyroid normal size, non-tender, without nodularity LYMPH: no  palpable lymphadenopathy in the cervical, axillary or inguinal LUNGS: diminished to auscultation and percussion with normal breathing effort HEART: regular rate & rhythm and no murmurs and no lower extremity edema ABDOMEN:  abdomen soft, non-tender and normal bowel sounds MUSCULOSKELETAL: no cyanosis of digits and no clubbing  PSYCH: alert & oriented x 3 with fluent speech NEURO: no focal motor/sensory deficits   All questions were answered. The patient knows to call the clinic with any problems, questions or concerns.   The total time spent in the appointment was 30 minutes encounter with patient including review of chart and various tests results, discussions about plan of care and coordination of care plan  Dawson Bills, NP 11/05/2023 11:44 AM    Labs Reviewed:  Lab Results  Component Value Date   WBC 0.4 (LL) 11/05/2023   HGB 7.0 (L) 11/05/2023   HCT 21.5 (L) 11/05/2023   MCV 87.8 11/05/2023   PLT 31 (L) 11/05/2023   Recent Labs    10/22/23 1147 10/25/23 0848 11/03/23 0313 11/03/23 0447 11/04/23 0410  NA 143 144 141 138 136  K 4.2 3.9 4.0 4.0 3.8  CL 105 101 97* 95* 98  CO2 35* 40* 35*  --  29  GLUCOSE 136* 86 76 73 108*  BUN 8 8 14 12 15   CREATININE 0.77 0.73 0.80 0.80 0.65  CALCIUM 8.4* 8.6* 8.5*  --  8.2*  GFRNONAA >60 >60 >60  --  >60  PROT 5.3* 5.6* 5.8*  --   --   ALBUMIN 3.0* 3.2* 3.0*  --   --   AST 16 19 20   --   --   ALT 16 16 15   --   --   ALKPHOS 96 99 60  --   --   BILITOT 0.4 0.6 0.8  --   --     Studies Reviewed:  VAS [place] UPPER EXTREMITY VENOUS DUPLEX Result Date: 11/04/2023 UPPER VENOUS STUDY  Patient Name:  [name]  Date of Exam:   11/04/2023 Medical Rec #: [id number]       Accession #:    [id number] Date of Birth: [birth year]        Patient Gender: F Patient Age:   1 years Exam Location:  [facility] Procedure:      VAS [place] UPPER EXTREMITY VENOUS DUPLEX Referring Phys: PARDEEP KHATRI --------------------------------------------------------------------------------  Indications: Swelling, and Pain Risk Factors: Cancer Breast past pregnancy. Comparison Study: None Performing Technologist: Shona Simpson  Examination Guidelines: A complete evaluation includes  B-mode imaging, spectral Doppler, color Doppler, and power Doppler as needed of all accessible portions of each vessel. Bilateral testing is considered an integral part of a complete examination. Limited examinations for reoccurring indications may be performed as noted.  Right Findings: +----------+------------+---------+-----------+----------+-------+ RIGHT     CompressiblePhasicitySpontaneousPropertiesSummary +----------+------------+---------+-----------+----------+-------+ IJV           Full       Yes       Yes                      +----------+------------+---------+-----------+----------+-------+ Subclavian    Full       Yes       Yes                      +----------+------------+---------+-----------+----------+-------+ Axillary      Full       Yes       Yes                      +----------+------------+---------+-----------+----------+-------+  Brachial      Full       Yes       Yes                      +----------+------------+---------+-----------+----------+-------+ Radial        Full       Yes       Yes                      +----------+------------+---------+-----------+----------+-------+ Ulnar         Full       Yes       Yes                      +----------+------------+---------+-----------+----------+-------+ Cephalic      Full                 Yes                      +----------+------------+---------+-----------+----------+-------+ Basilic       Full                 Yes                      +----------+------------+---------+-----------+----------+-------+  Left Findings: +----------+------------+---------+-----------+----------+-------+ LEFT      CompressiblePhasicitySpontaneousPropertiesSummary +----------+------------+---------+-----------+----------+-------+ Subclavian    Full       Yes       Yes                      +----------+------------+---------+-----------+----------+-------+  Summary:  Right: No evidence of  deep vein thrombosis in the upper extremity. No evidence of superficial vein thrombosis in the upper extremity.  Left: No evidence of thrombosis in the subclavian.  *See table(s) above for measurements and observations.  Diagnosing physician: Coral Else MD Electronically signed by Coral Else MD on 11/04/2023 at 7:21:59 PM.    Final    CT Angio Chest/Abd/Pel for Dissection W and/or Wo Contrast Result Date: 11/03/2023 CLINICAL DATA:  Abdominal pain, tachycardia, pulsatile abdominal mass history of lung cancer * Tracking Code: BO * EXAM: CT ANGIOGRAPHY CHEST, ABDOMEN AND PELVIS TECHNIQUE: Non-contrast CT of the chest was initially obtained. Multidetector CT imaging through the chest, abdomen and pelvis was performed using the standard protocol during bolus administration of intravenous contrast. Multiplanar reconstructed images and MIPs were obtained and reviewed to evaluate the vascular anatomy. RADIATION DOSE REDUCTION: This exam was performed according to the departmental dose-optimization program which includes automated exposure control, adjustment of the mA and/or kV according to patient size and/or use of iterative reconstruction technique. CONTRAST:  OMNIPAQUE IOHEXOL 350 MG/ML SOLN COMPARISON:  CT chest, 09/19/2023, CT abdomen pelvis, 10/13/2023 FINDINGS: CTA CHEST FINDINGS VASCULAR Aorta: Satisfactory opacification of the aorta. Normal contour and caliber of the thoracic aorta. No evidence of aneurysm, dissection, or other acute aortic pathology. Moderate aortic atherosclerosis Cardiovascular: Right chest port catheter. No evidence of pulmonary embolism on limited non-tailored examination. Normal heart size. Enlargement of the main pulmonary artery measuring up to 3.9 cm in caliber. No pericardial effusion. Review of the MIP images confirms the above findings. NON VASCULAR Mediastinum/Nodes: No enlarged mediastinal, hilar, or axillary lymph nodes. Thyroid gland, trachea, and esophagus  demonstrate no significant findings. Lungs/Pleura: Small right pleural effusion, similar to prior examination. Perhaps slightly diminished size of a cavitary mass of the right upper lobe measuring 4.9 x 3.2 cm,  previously 4.8 x 3.6 cm, with somewhat increased cavitation (series 12, image 34). New, dense nodular consolidation in the anterior left upper lobe measuring 2.4 x 1.8 cm (series 12, image 55). Diffuse bilateral bronchial wall thickening. Bandlike scarring of the bilateral lung bases. Musculoskeletal: No chest wall abnormality. No acute osseous findings. Review of the MIP images confirms the above findings. CTA ABDOMEN AND PELVIS FINDINGS VASCULAR Normal contour and caliber of the abdominal aorta. No evidence of aneurysm, dissection, or other acute aortic pathology. Standard branching pattern of the abdominal aorta with solitary bilateral renal arteries. Moderate aortic atherosclerosis. Review of the MIP images confirms the above findings. NON-VASCULAR Hepatobiliary: No significant change in hypodense lesions of the posterior liver, largest measuring 1.9 x 1.5 cm (series 22, image 163). No gallstones, gallbladder wall thickening, or biliary dilatation. Pancreas: Unremarkable. No pancreatic ductal dilatation or surrounding inflammatory changes. Spleen: Normal in size without significant abnormality. Adrenals/Urinary Tract: Adrenal glands are unremarkable. Simple, benign renal cortical cysts, for which no further follow-up or characterization is required. Kidneys are otherwise normal, without renal calculi, solid lesion, or hydronephrosis. Bladder is unremarkable. Stomach/Bowel: Stomach is within normal limits. Appendix not clearly visualized and may be surgically absent. No evidence of bowel wall thickening, distention, or inflammatory changes. Lymphatic: No enlarged abdominal or pelvic lymph nodes. Reproductive: Status post hysterectomy Other: No abdominal wall hernia or abnormality. No ascites.  Musculoskeletal: No acute osseous findings. IMPRESSION: 1. Normal contour and caliber of the thoracic and abdominal aorta. No evidence of aneurysm, dissection, or other acute aortic pathology. Moderate aortic atherosclerosis. 2. Perhaps slightly diminished size of a cavitary mass of the right upper lobe with somewhat increased cavitation. Findings are consistent with known primary lung malignancy. 3. New, dense nodular consolidation in the anterior left upper lobe measuring 2.4 x 1.8 cm. Findings are most consistent with infection rapid interval development. 4. Small right pleural effusion, similar to prior examination. 5. No significant change in hypodense lesions of the posterior liver, largest measuring 1.9 x 1.5 cm. These remain suspicious for metastases. 6. Enlargement of the main pulmonary artery, as can be seen in pulmonary hypertension. Aortic Atherosclerosis (ICD10-I70.0). Electronically Signed   By: Jearld Lesch M.D.   On: 11/03/2023 09:28   DG Chest Portable 1 View Result Date: 11/03/2023 CLINICAL DATA:  Sepsis. Abdominal pain for 3 days. History of non-small cell lung cancer. EXAM: PORTABLE CHEST 1 VIEW COMPARISON:  10/12/2023 FINDINGS: Right chest wall porta catheter is noted with tip in the distal SVC. Heart size and mediastinal contours are stable. Unchanged small right pleural effusion. Right apical cavitary lung mass with surrounding pleuroparenchymal scarring is unchanged. Left lung is clear. No acute abnormality. IMPRESSION: 1. No acute findings. 2. Persistent small right pleural effusion. 3. Unchanged right apical cavitary lung mass with surrounding pleuroparenchymal scarring. Electronically Signed   By: Signa Kell M.D.   On: 11/03/2023 08:16   DG Lumbar Spine Complete Result Date: 10/20/2023 CLINICAL DATA:  Fall, low back pain EXAM: LUMBAR SPINE - COMPLETE 4+ VIEW COMPARISON:  02/18/2020, CT 10/13/2023 FINDINGS: Normal alignment. No fracture. Early degenerative changes in the lower  lumbar spine with slight disc space narrowing and early spurring. Vascular calcifications. No visible aneurysm. IMPRESSION: No acute bony abnormality. Electronically Signed   By: Charlett Nose M.D.   On: 10/20/2023 03:34   CT Cervical Spine Wo Contrast Result Date: 10/20/2023 CLINICAL DATA:  Polytrauma, blunt.  Fall. EXAM: CT CERVICAL SPINE WITHOUT CONTRAST TECHNIQUE: Multidetector CT imaging of the cervical spine was  performed without intravenous contrast. Multiplanar CT image reconstructions were also generated. RADIATION DOSE REDUCTION: This exam was performed according to the departmental dose-optimization program which includes automated exposure control, adjustment of the mA and/or kV according to patient size and/or use of iterative reconstruction technique. COMPARISON:  None Available. FINDINGS: Alignment: Normal Skull base and vertebrae: No acute fracture. No primary bone lesion or focal pathologic process. Soft tissues and spinal canal: No prevertebral fluid or swelling. No visible canal hematoma. Disc levels: Mild degenerative disc disease in the lower cervical spine. No disc herniation. Upper chest: Again partially visualized is the right upper lobe mass and layering large right pleural effusion. Other: None IMPRESSION: No acute bony abnormality. Known right apical mass and large layering right pleural effusion. Electronically Signed   By: Charlett Nose M.D.   On: 10/20/2023 02:08   CT Head Wo Contrast Result Date: 10/20/2023 CLINICAL DATA:  Polytrauma, blunt.  Fall. EXAM: CT HEAD WITHOUT CONTRAST TECHNIQUE: Contiguous axial images were obtained from the base of the skull through the vertex without intravenous contrast. RADIATION DOSE REDUCTION: This exam was performed according to the departmental dose-optimization program which includes automated exposure control, adjustment of the mA and/or kV according to patient size and/or use of iterative reconstruction technique. COMPARISON:  None Available.  FINDINGS: Brain: Again noted are areas of edema within the left temporal lobe and left occipital lobe in areas of prior known metastases. No areas of hemorrhage. No hydrocephalus. No midline shift., Improved since prior study Vascular: No hyperdense vessel or unexpected calcification. Skull: No acute calvarial abnormality. 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 No acute findings. Electronically Signed   By: Charlett Nose M.D.   On: 10/20/2023 02:05   DG HIP UNILAT WITH PELVIS 2-3 VIEWS RIGHT Result Date: 10/18/2023 CLINICAL DATA:  Fall EXAM: DG HIP (WITH OR WITHOUT PELVIS) 2-3V RIGHT COMPARISON:  CT 10/13/2023 FINDINGS: No fracture or malalignment.  Moderate hip arthritis. IMPRESSION: No acute osseous abnormality. Electronically Signed   By: Jasmine Pang M.D.   On: 10/18/2023 23:45   DG HIP UNILAT WITH PELVIS 2-3 VIEWS LEFT Result Date: 10/18/2023 CLINICAL DATA:  Fall EXAM: DG HIP (WITH OR WITHOUT PELVIS) 2-3V LEFT COMPARISON:  CT 10/13/2023 FINDINGS: SI joints are non widened. Pubic symphysis and rami are intact. No acute fracture or malalignment. IMPRESSION: No acute osseous abnormality Electronically Signed   By: Jasmine Pang M.D.   On: 10/18/2023 23:44   US BIOPSY (LIVER) Result Date: 10/18/2023 INDICATION: 62 year old female with a history liver lesion, possible tumor versus abscess EXAM: ULTRASOUND-GUIDED LIVER LESION BIOPSY MEDICATIONS: None. ANESTHESIA/SEDATION: Moderate (conscious) sedation was employed during this procedure. A total of Versed 1.5 mg and Fentanyl 50 mcg was administered intravenously by the radiology nurse. Total intra-service moderate Sedation Time: 18 minutes. The patient's level of consciousness and vital signs were monitored continuously by radiology nursing throughout the procedure under my direct supervision. COMPLICATIONS: NONE PROCEDURE: Informed written  consent was obtained from the patient after a thorough discussion of the procedural risks, benefits and alternatives. All questions were addressed. Maximal Sterile Barrier Technique was utilized including caps, mask, sterile gowns, sterile gloves, sterile drape, hand hygiene and skin antiseptic. A timeout was performed prior to the initiation of the procedure. Ultrasound survey of the right liver lobe performed with images stored and sent to PACs. The lesion in segment 6 is hypoechoic and quite small. The only way to  visualize the lesion for a possible biopsy was from a medial to lateral approach from the anterior. The right lower thorax/right upper abdomen was prepped with chlorhexidine in a sterile fashion, and a sterile drape was applied covering the operative field. A sterile gown and sterile gloves were used for the procedure. Local anesthesia was provided with 1% Lidocaine. The patient was prepped and draped sterilely and the skin and subcutaneous tissues were generously infiltrated with 1% lidocaine. A 17 gauge introducer needle was then advanced under ultrasound guidance in a subcostal location into the right liver lobe. The stylet was removed, and aspiration was attempted. No aspirate returned. Multiple separate 18 gauge core biopsy were then retrieved. Samples were placed into formalin for transportation to the lab. The needle was removed, and a final ultrasound image was performed. The patient tolerated the procedure well and remained hemodynamically stable throughout. No complications were encountered and no significant blood loss was encounter. IMPRESSION: Status post ultrasound-guided biopsy of right liver lesion. Aspiration was attempted, with no aspirate achieved. Signed, Yvone Neu. Miachel Roux, RPVI [facility] Electronically Signed   By: Gilmer Mor D.O.   On: 10/18/2023 11:23   MR ABDOMEN W WO CONTRAST Result Date:  10/14/2023 CLINICAL DATA:  Liver lesion EXAM: MRI ABDOMEN WITHOUT AND WITH CONTRAST TECHNIQUE: Multiplanar multisequence MR imaging of the abdomen was performed both before and after the administration of intravenous contrast. CONTRAST:  7mL GADAVIST GADOBUTROL 1 MMOL/ML IV SOLN COMPARISON:  CT 10/13/2023.  Older CT 02/19/2019. FINDINGS: Lower chest: Tiny left and small right pleural effusion. Heart enlarged is some linear signal changes in the lung bases. Atelectasis or scarring. Hepatobiliary: No biliary ductal dilatation. Gallbladder is nondilated. Patent portal vein. Complex lesion seen posteriorly in the right hepatic lobe segment 6/7 is again identified today. This lesion has a fluid-fluid level measures 16 by 12 mm. There is some marginal enhancement of the lesion as well as some mild adjacent edema. Again differential would include an abscess. Smaller lesion identified just anterior and superior to this lesion is dark on precontrast T1, bright on T2 and also has some marginal enhancement measuring 12 mm. The lesion seen in segment 7 more superiorly is less visible today but again there is significant breathing motion Pancreas: No mass, inflammatory changes, or other parenchymal abnormality identified. Spleen: Small splenic lesion again identified posteriorly which is dark on T1, moderately bright on T2. Assessment of enhancement is difficult with motion. Again lesion measures 8 mm. Adrenals/Urinary Tract: Adrenal glands are preserved. Benign bilateral renal cystic foci are identified. Stomach/Bowel: Visualized bowel is nondilated. The stranding along the course of the mid descending colon is again noted. Vascular/Lymphatic: Mild atherosclerotic changes along the aorta. Normal caliber aorta and IVC. No discrete abnormal lymph node enlargement in the visualized abdomen. Other:  Evaluation significantly limited by motion. Musculoskeletal: Degenerative changes along the spine. IMPRESSION: Three liver lesions  identified. Once again the dominant focus posteriorly in the right hepatic lobe has a rim enhancement as well as abnormal diffusion. This has concerning for abscess over a malignant lesion. The 2 other smaller lesions are more indeterminate and would recommend close follow-up. Indeterminate small splenic lesion.  Attention on follow-up. Small right and tiny left pleural effusion. Evaluation significantly limited by motion. Electronically Signed   By: Karen Kays M.D.   On: 10/14/2023 17:14   CT ABDOMEN PELVIS W CONTRAST Result Date: 10/13/2023 CLINICAL DATA:  Lung cancer.  Sepsis. EXAM: CT ABDOMEN AND PELVIS  WITH CONTRAST TECHNIQUE: Multidetector CT imaging of the abdomen and pelvis was performed using the standard protocol following bolus administration of intravenous contrast. RADIATION DOSE REDUCTION: This exam was performed according to the departmental dose-optimization program which includes automated exposure control, adjustment of the mA and/or kV according to patient size and/or use of iterative reconstruction technique. CONTRAST:  75mL OMNIPAQUE IOHEXOL 300 MG/ML  SOLN COMPARISON:  PET-CT 08/03/2023. standard CT April 2020 FINDINGS: Lower chest: Bronchiectasis seen along the lung bases. Breathing motion. Dependent areas of atelectasis and scarring. Trace pleural fluid on the right. Right-sided fat containing diaphragmatic hernia again seen. Hepatobiliary: Nodular contours of the liver. Cystic lesion posterior along the right hepatic lobe on series 3, image 21 has a capsule and some associated parenchymal edema surrounding lesion measuring 19 x 16 mm. Small focus just above this well but more simple appearing measuring 10 mm and a vague low-density area in segment 7 measuring 7 mm on series 3, image 10. These are not clearly seen on the previous examinations. Patent portal vein. Gallbladder is mildly distended. Pancreas: Unremarkable. No pancreatic ductal dilatation or surrounding inflammatory changes.  Spleen: Spleen is nonenlarged. There is a cystic area posterior along the spleen which was not seen previously measuring 8 mm. Not clearly a simple cyst Adrenals/Urinary Tract: Adrenal glands are preserved. Benign bilateral renal cystic foci identified, Bosniak 1 and 2 lesions. No enhancing mass or collecting system dilatation. The ureters have normal course and caliber extending down to the urinary bladder. Preserved contours of the urinary bladder. Stomach/Bowel: Stomach is decompressed. Small bowel is nondilated. The large bowel has a normal course and caliber. Left-sided areas of wall thickening identified with diverticula and stranding. Please correlate for a colitis. Mild changes as well along the sigmoid and rectum. Fluid along the lumen of the right side of the colon normal appendix seen in the right hemipelvis. Posteromedial to the cecum. Vascular/Lymphatic: Aortic atherosclerosis. No enlarged abdominal or pelvic lymph nodes. Reproductive: Status post hysterectomy. No adnexal masses. Other: Anasarca. Musculoskeletal: Degenerative changes seen of the spine and pelvis particularly of the right hip. IMPRESSION: New complex cystic areas in the right hepatic lobe. Based on appearance this has worrisome for potential abscess formation but there is a differential. Please correlate with clinical presentation. If needed MRI evaluation could be considered. Largest measures 19 mm in the right hepatic lobe. Small cystic area in the spleen is mildly complex and more indeterminate. This also could be assessed on follow-up. Wall thickening with stranding along the descending colon greater than the rectosigmoid colon. Please correlate for a colitis. No obstruction. Recommend follow-up. Tiny right pleural effusion. Electronically Signed   By: Karen Kays M.D.   On: 10/13/2023 14:21   DG Chest Port 1 View Result Date: 10/12/2023 CLINICAL DATA:  Possible sepsis. EXAM: PORTABLE CHEST 1 VIEW COMPARISON:  09/17/2023 and  older exams.  CT, 09/19/2023. FINDINGS: Cardiac silhouette is normal in size. No convincing mediastinal or hilar masses. Right anterior chest wall Port-A-Cath is stable. Right-sided volume loss with mediastinal shift, stable. There is an ill-defined masslike opacity in the right upper lobe, unchanged. Stable interstitial type opacities right mid lung and both lung bases. No new lung abnormalities. No convincing pleural effusion.  No pneumothorax. Skeletal structures are grossly intact. Surgical vascular clips on the right reflect prior breast surgery, also stable IMPRESSION: 1. No significant change from the recent prior chest radiograph. 2. 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 3. Right-sided volume loss with mediastinal shift.  4. No new abnormalities. Electronically Signed   By: Amie Portland M.D.   On: 10/12/2023 08:45   ADDENDUM: Hematology/Oncology Attending: The patient is seen and examined today.  I agree with the above note and care plan.  This is a very pleasant 62 years old African-American female with stage IV non-small cell lung cancer, squamous cell carcinoma diagnosed in August 2024 status post SRS to the brain in addition to SBRT to the lung and bone lesions.  The patient is currently undergoing systemic chemotherapy with carboplatin, paclitaxel and Keytruda status post 2 cycles.  She was supposed to get Neulasta injection after cycle #2 but the patient was admitted to the hospital before her injection.  She presented to the hospital with shortness of breath and significant fatigue.  She was diagnosed with pneumonia and currently on broad-spectrum antibiotics with cefepime and vancomycin.  She was found to have pancytopenia after her admission secondary to her most recent systemic chemotherapy. For the leukocytopenia, we will start the patient on Granix 300 mcg subcutaneously injection until her absolute neutrophil count is 1500 or higher for at least 2 days. For  the chemotherapy-induced anemia, I would recommend PRBCs transfusion to keep her hemoglobin above 8 if possible. For the chemotherapy-induced thrombocytopenia she will receive 1 unit of platelet transfusion to keep her platelet count over 20,000. Thank you so much for taking good care of Ms. Carchi, we will continue to monitor the patient with you and assist in her management on as-needed basis. Disclaimer: This note was dictated with voice recognition software. Similar sounding words can inadvertently be transcribed and may be missed upon review. Lajuana Matte, MD

## 2023-11-06 DIAGNOSIS — J189 Pneumonia, unspecified organism: Secondary | ICD-10-CM | POA: Diagnosis not present

## 2023-11-06 DIAGNOSIS — A419 Sepsis, unspecified organism: Secondary | ICD-10-CM | POA: Diagnosis not present

## 2023-11-06 LAB — TYPE AND SCREEN
ABO/RH(D): O POS
Antibody Screen: NEGATIVE
Unit division: 0
Unit division: 0

## 2023-11-06 LAB — CBC WITH DIFFERENTIAL/PLATELET
Abs Immature Granulocytes: 0 10*3/uL (ref 0.00–0.07)
Basophils Absolute: 0 10*3/uL (ref 0.0–0.1)
Basophils Relative: 0 %
Eosinophils Absolute: 0 10*3/uL (ref 0.0–0.5)
Eosinophils Relative: 0 %
HCT: 26.4 % — ABNORMAL LOW (ref 36.0–46.0)
Hemoglobin: 8.6 g/dL — ABNORMAL LOW (ref 12.0–15.0)
Immature Granulocytes: 0 %
Lymphocytes Relative: 17 %
Lymphs Abs: 0.1 10*3/uL — ABNORMAL LOW (ref 0.7–4.0)
MCH: 28.2 pg (ref 26.0–34.0)
MCHC: 32.6 g/dL (ref 30.0–36.0)
MCV: 86.6 fL (ref 80.0–100.0)
Monocytes Absolute: 0.1 10*3/uL (ref 0.1–1.0)
Monocytes Relative: 23 %
Neutro Abs: 0.3 10*3/uL — CL (ref 1.7–7.7)
Neutrophils Relative %: 60 %
Platelets: 18 10*3/uL — CL (ref 150–400)
RBC: 3.05 MIL/uL — ABNORMAL LOW (ref 3.87–5.11)
RDW: 14.8 % (ref 11.5–15.5)
WBC: 0.5 10*3/uL — CL (ref 4.0–10.5)
nRBC: 0 % (ref 0.0–0.2)

## 2023-11-06 LAB — COMPREHENSIVE METABOLIC PANEL
ALT: 12 U/L (ref 0–44)
AST: 12 U/L — ABNORMAL LOW (ref 15–41)
Albumin: 2.5 g/dL — ABNORMAL LOW (ref 3.5–5.0)
Alkaline Phosphatase: 48 U/L (ref 38–126)
Anion gap: 10 (ref 5–15)
BUN: 13 mg/dL (ref 8–23)
CO2: 27 mmol/L (ref 22–32)
Calcium: 8 mg/dL — ABNORMAL LOW (ref 8.9–10.3)
Chloride: 100 mmol/L (ref 98–111)
Creatinine, Ser: 0.71 mg/dL (ref 0.44–1.00)
GFR, Estimated: >60 mL/min (ref >60)
Glucose, Bld: 101 mg/dL — ABNORMAL HIGH (ref 70–99)
Potassium: 3.3 mmol/L — ABNORMAL LOW (ref 3.5–5.1)
Sodium: 137 mmol/L (ref 135–145)
Total Bilirubin: 0.8 mg/dL (ref <1.2)
Total Protein: 5.4 g/dL — ABNORMAL LOW (ref 6.5–8.1)

## 2023-11-06 LAB — BPAM RBC
Blood Product Expiration Date: 202501132359
Blood Product Expiration Date: 202501222359
ISSUE DATE / TIME: 202412221005
ISSUE DATE / TIME: 202412231434
Unit Type and Rh: 5100
Unit Type and Rh: 5100

## 2023-11-06 LAB — MAGNESIUM: Magnesium: 1.8 mg/dL (ref 1.7–2.4)

## 2023-11-06 MED ORDER — POTASSIUM CHLORIDE CRYS ER 20 MEQ PO TBCR
40.0000 meq | EXTENDED_RELEASE_TABLET | Freq: Once | ORAL | Status: AC
  Administered 2023-11-06: 40 meq via ORAL
  Filled 2023-11-06: qty 2

## 2023-11-06 MED ORDER — SODIUM CHLORIDE 0.9% IV SOLUTION: Freq: Once | INTRAVENOUS | Status: DC

## 2023-11-06 NOTE — Evaluation (Signed)
Physical Therapy Evaluation Patient Details Name: [name] MRN: [id number] DOB: August 28, 1961 Today's Date: 11/06/2023  History of Present Illness  62 yo female admitted with Pna, sepsis, pancytopenia. Hx of stage IV  NSCLC with mets, COPD-O2 dep, sarcoidosis, neutropenic fever, diarrhea, breast Ca  Clinical Impression  Limited bed level eval only. Pt not agreeable to OOB activity. Pt did allow therapist to assess UE/LE--strength at least 3/5 in UEs and LEs. RN reports pt has been OOB with some assistance. Discussed d/c plan-pt reports she plans to return home where she lives with family. Will plan to follow pt during hospital stay. Will progress activity as pt will allow. Recommend daily mobility with nursing and/or mobility team as able.       If plan is discharge home, recommend the following: A little help with walking and/or transfers;Direct supervision/assist for medications management;Direct supervision/assist for financial management;Assist for transportation;Help with stairs or ramp for entrance;Supervision due to cognitive status   Can travel by private vehicle        Equipment Recommendations None recommended by PT  Recommendations for Other Services       Functional Status Assessment Patient has had a recent decline in their functional status and demonstrates the ability to make significant improvements in function in a reasonable and predictable amount of time.     Precautions / Restrictions Precautions Precautions: Fall Precaution Comments: 2L o2 baseline Restrictions Weight Bearing Restrictions Per Provider Order: No      Mobility  Bed Mobility Overal bed mobility: Modified Independent             General bed mobility comments: pt able to raise trunk from bed surface almost sitting in long-sitting.    Transfers                   General transfer comment: NT-pt declined    Ambulation/Gait                  Stairs             Wheelchair Mobility     Tilt Bed    Modified Rankin (Stroke Patients Only)       Balance                                             Pertinent Vitals/Pain Pain Assessment Pain Assessment: No/denies pain    Home Living Family/patient expects to be discharged to:: Private residence Living Arrangements: Other relatives Available Help at Discharge: Family Type of Home: Apartment Home Access: Stairs to enter Entrance Stairs-Rails: Can reach both Entrance Stairs-Number of Steps: 7   Home Layout: One level Home Equipment: Rollator (4 wheels);Shower seat;Wheelchair - manual;Hospital bed      Prior Function Prior Level of Function : Needs assist             Mobility Comments: PRN use of w/c and rollator ADLs Comments: ind     Extremity/Trunk Assessment   Upper Extremity Assessment Upper Extremity Assessment: Generalized weakness    Lower Extremity Assessment Lower Extremity Assessment: Generalized weakness    Cervical / Trunk Assessment Cervical / Trunk Assessment: Normal  Communication   Communication Communication: No apparent difficulties  Cognition Arousal: Alert Behavior During Therapy: Flat affect Overall Cognitive Status: No family/caregiver present to determine baseline cognitive functioning  General Comments      Exercises     Assessment/Plan    PT Assessment Patient needs continued PT services  PT Problem List Decreased strength;Decreased activity tolerance;Decreased balance;Decreased mobility;Decreased knowledge of use of DME       PT Treatment Interventions DME instruction;Gait training;Functional mobility training;Therapeutic activities;Therapeutic exercise;Patient/family education;Balance training;Cognitive remediation    PT Goals (Current goals can be found in the Care Plan section)  Acute Rehab PT Goals Patient Stated Goal: to go home PT Goal  Formulation: With patient Time For Goal Achievement: 11/20/23 Potential to Achieve Goals: Good    Frequency Min 1X/week     Co-evaluation               AM-PAC PT "6 Clicks" Mobility  Outcome Measure Help needed turning from your back to your side while in a flat bed without using bedrails?: None Help needed moving from lying on your back to sitting on the side of a flat bed without using bedrails?: A Little Help needed moving to and from a bed to a chair (including a wheelchair)?: A Little Help needed standing up from a chair using your arms (e.g., wheelchair or bedside chair)?: A Little Help needed to walk in hospital room?: A Little Help needed climbing 3-5 steps with a railing? : A Lot 6 Click Score: 18    End of Session Equipment Utilized During Treatment: Oxygen Activity Tolerance: Patient tolerated treatment well Patient left: in bed;with call bell/phone within reach;with bed alarm set   PT Visit Diagnosis: Muscle weakness (generalized) (M62.81);Difficulty in walking, not elsewhere classified (R26.2)    Time: 5366-4403 PT Time Calculation (min) (ACUTE ONLY): 12 min   Charges:   PT Evaluation $PT Eval Low Complexity: 1 Low   PT General Charges $$ ACUTE PT VISIT: 1 Visit           Faye Ramsay, PT Acute Rehabilitation  Office: [phone]

## 2023-11-06 NOTE — Progress Notes (Addendum)
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  She was started on broad-spectrum antibiotics.  Oncology was consulted.  Assessment & Plan:   Sepsis: Present on admission Healthcare associate pneumonia -Currently on cefepime and vancomycin.  Blood cultures are negative so far. -Hemodynamically improving.  COPD Chronic respiratory failure with hypoxia -Respiratory status remained stable at baseline 2 L oxygen via nasal cannula.  No evidence of COPD exacerbation -Continue current inhaled regimen  Stage IV non-small cell lung cancer Pancytopenia -Follows up with oncology/neuro-oncology and radiation oncology as an outpatient.  Continue home dose Decadron.  Currently on outpatient palliative chemotherapy overall prognosis is guarded to poor. -Received 2 units of packed red cells transfusion since hospitalization.  Hemoglobin 8.6 today.  Has persistent neutropenia and thrombocytopenia.  She is on Granix currently as per oncology recommendations.  Platelets 18 today.  Will transfuse 1 unit of platelets today as per oncology recommendations.  Hypomagnesemia -Improved  Hypokalemia -Replace.  Repeat a.m. labs  Hypertension Hyperlipidemia -Continue current regimen  Depression--continue sertraline  Physical deconditioning -PT eval   DVT prophylaxis: SCDs Code Status: DNR Family Communication: None at bedside Disposition Plan: Status is: Inpatient Remains inpatient appropriate because: Of severity of illness  Consultants:  oncology  Procedures: None  Antimicrobials:  Anti-infectives (From admission, onward)    Start     Dose/Rate Route Frequency Ordered Stop   11/04/23 1100  Vancomycin (VANCOCIN) 1,500 mg in sodium chloride 0.9 % 500 mL IVPB        1,500 mg 250 mL/hr over 120 Minutes Intravenous Every 24 hours 11/03/23 1031     11/03/23 1200  ceFEPIme (MAXIPIME) 2 g in sodium chloride 0.9 % 100 mL IVPB        2 g 200 mL/hr over 30 Minutes Intravenous Every 8 hours 11/03/23 1031     11/03/23 1030  Vancomycin (VANCOCIN) 1,500 mg in sodium chloride 0.9 % 500 mL IVPB        1,500 mg 250 mL/hr over 120 Minutes Intravenous  Once 11/03/23 1027 11/03/23 1301   11/03/23 0330  ceFEPIme (MAXIPIME) 2 g in sodium chloride 0.9 % 100 mL IVPB        2 g 200 mL/hr over 30 Minutes Intravenous  Once 11/03/23 0318 11/03/23 0428   11/03/23 0330  metroNIDAZOLE (FLAGYL) IVPB 500 mg        500 mg 100 mL/hr over 60 Minutes Intravenous  Once 11/03/23 0318 11/03/23 0542        Subjective: Patient seen and examined at bedside.  Continues to feel weak.  No seizures, fever or vomiting reported.  Objective: Vitals:   11/05/23 2006 11/05/23 2033 11/06/23 0359 11/06/23 0421  BP: (!) 152/78  (!) 160/82 (!) 167/79  Pulse: 63  68 64  Resp: 15  16 18   Temp: 97.8 F (36.6 C)  98 F (36.7 C) 97.9 F (36.6 C)  TempSrc: Oral  Oral Oral  SpO2: 98% 99% 95% 97%  Weight:      Height:        Intake/Output Summary (Last 24 hours) at 11/06/2023  4782 Last data filed at 11/06/2023 0600 Gross per 24 hour  Intake 825 ml  Output --  Net 825 ml   Filed Weights   11/03/23 1318  Weight: 68.2 kg    Examination:  General: Currently on 2 L oxygen via nasal cannula.  No distress.  Chronically ill and deconditioned looking. ENT/neck: No thyromegaly.  JVD is not elevated  respiratory: Decreased breath sounds at bases bilaterally with some crackles; no wheezing  CVS: S1-S2 heard, rate controlled currently Abdominal: Soft, nontender,  slightly distended; no organomegaly, bowel sounds are heard Extremities: Trace lower extremity edema; no cyanosis  CNS: Awake; still very slow to respond.  Poor historian.  No focal neurologic deficit.  Moves extremities Lymph: No obvious lymphadenopathy Skin: No obvious ecchymosis/lesions  psych: Flat affect currently.  No signs of agitation  musculoskeletal: No obvious joint swelling/deformity    Data Reviewed: I have personally reviewed following labs and imaging studies  CBC: Recent Labs  Lab 11/03/23 0313 11/03/23 0447 11/04/23 0410 11/04/23 1605 11/05/23 0212 11/05/23 2006 11/06/23 0121  WBC 1.2*  --  1.0*  --  0.4*  --  0.5*  NEUTROABS 0.9*  --   --   --  0.3*  --  0.3*  HGB 8.9*   < > 6.4* 7.3* 7.0* 8.7* 8.6*  HCT 29.0*   < > 20.0* 21.0* 21.5* 26.2* 26.4*  MCV 90.3  --  87.3  --  87.8  --  86.6  PLT 65*  --  50*  --  31*  --  18*   < > = values in this interval not displayed.   Basic Metabolic Panel: Recent Labs  Lab 11/03/23 0313 11/03/23 0447 11/04/23 0410 11/05/23 0212 11/06/23 0121  NA 141 138 136  --  137  K 4.0 4.0 3.8  --  3.3*  CL 97* 95* 98  --  100  CO2 35*  --  29  --  27  GLUCOSE 76 73 108*  --  101*  BUN 14 12 15   --  13  CREATININE 0.80 0.80 0.65  --  0.71  CALCIUM 8.5*  --  8.2*  --  8.0*  MG  --   --   --  1.3* 1.8  PHOS  --   --   --  2.6  --    GFR: Estimated Creatinine Clearance: 64.8 mL/min (by C-G formula based on SCr of 0.71 mg/dL). Liver Function Tests: Recent Labs  Lab 11/03/23 0313 11/06/23 0121  AST 20 12*  ALT 15 12  ALKPHOS 60 48  BILITOT 0.8 0.8  PROT 5.8* 5.4*  ALBUMIN 3.0* 2.5*   Recent Labs  Lab 11/03/23 0313  LIPASE 22   No results for input(s): "AMMONIA" in the last 168 hours. Coagulation Profile: Recent Labs  Lab 11/03/23 0501  INR 1.3*   Cardiac Enzymes: No results for input(s): "CKTOTAL", "CKMB", "CKMBINDEX", "TROPONINI" in the last 168 hours. BNP (last 3 results) No results for input(s):  "PROBNP" in the last 8760 hours. HbA1C: No results for input(s): "HGBA1C" in the last 72 hours. CBG: No results for input(s): "GLUCAP" in the last 168 hours. Lipid Profile: No results for input(s): "CHOL", "HDL", "LDLCALC", "TRIG", "CHOLHDL", "LDLDIRECT" in the last 72 hours. Thyroid Function Tests: No results for input(s): "TSH", "T4TOTAL", "FREET4", "T3FREE", "THYROIDAB" in the last 72 hours. Anemia Panel: No results for input(s): "VITAMINB12", "FOLATE", "FERRITIN", "TIBC", "IRON", "RETICCTPCT" in the last 72 hours. Sepsis Labs: Recent Labs  Lab 11/03/23 [phone]  11/03/23 0807  LATICACIDVEN 1.7 1.6    Recent Results (from the past 240 hours)  Resp panel by RT-PCR (RSV, Flu A&B, Covid) Anterior Nasal Swab     Status: None   Collection Time: 11/03/23  4:14 AM   Specimen: Anterior Nasal Swab  Result Value Ref Range Status   SARS Coronavirus 2 by RT PCR NEGATIVE NEGATIVE Final    Comment: (NOTE) SARS-CoV-2 target nucleic acids are NOT DETECTED.  The SARS-CoV-2 RNA is generally detectable in upper respiratory specimens during the acute phase of infection. The lowest concentration of SARS-CoV-2 viral copies this assay can detect is 138 copies/mL. A negative result does not preclude SARS-Cov-2 infection and should not be used as the sole basis for treatment or other patient management decisions. A negative result may occur with  improper specimen collection/handling, submission of specimen other than nasopharyngeal swab, presence of viral mutation(s) within the areas targeted by this assay, and inadequate number of viral copies(<138 copies/mL). A negative result must be combined with clinical observations, patient history, and epidemiological information. The expected result is Negative.  Fact Sheet for Patients:  BloggerCourse.com  Fact Sheet for Healthcare Providers:  SeriousBroker.it  This test is no t yet approved or cleared  by the [place] FDA and  has been authorized for detection and/or diagnosis of SARS-CoV-2 by FDA under an Emergency Use Authorization (EUA). This EUA will remain  in effect (meaning this test can be used) for the duration of the COVID-19 declaration under Section 564(b)(1) of the Act, 21 U.S.C.section 360bbb-3(b)(1), unless the authorization is terminated  or revoked sooner.       Influenza A by PCR NEGATIVE NEGATIVE Final   Influenza B by PCR NEGATIVE NEGATIVE Final    Comment: (NOTE) The Xpert Xpress SARS-CoV-2/FLU/RSV plus assay is intended as an aid in the diagnosis of influenza from Nasopharyngeal swab specimens and should not be used as a sole basis for treatment. Nasal washings and aspirates are unacceptable for Xpert Xpress SARS-CoV-2/FLU/RSV testing.  Fact Sheet for Patients: BloggerCourse.com  Fact Sheet for Healthcare Providers: SeriousBroker.it  This test is not yet approved or cleared by the [place] FDA and has been authorized for detection and/or diagnosis of SARS-CoV-2 by FDA under an Emergency Use Authorization (EUA). This EUA will remain in effect (meaning this test can be used) for the duration of the COVID-19 declaration under Section 564(b)(1) of the Act, 21 U.S.C. section 360bbb-3(b)(1), unless the authorization is terminated or revoked.     Resp Syncytial Virus by PCR NEGATIVE NEGATIVE Final    Comment: (NOTE) Fact Sheet for Patients: BloggerCourse.com  Fact Sheet for Healthcare Providers: SeriousBroker.it  This test is not yet approved or cleared by the [place] FDA and has been authorized for detection and/or diagnosis of SARS-CoV-2 by FDA under an Emergency Use Authorization (EUA). This EUA will remain in effect (meaning this test can be used) for the duration of the COVID-19 declaration under Section 564(b)(1) of the Act, 21  U.S.C. section 360bbb-3(b)(1), unless the authorization is terminated or revoked.  Performed at [facility], [address], [place], [place] [address]   Blood Culture (routine x 2)     Status: None (Preliminary result)   Collection Time: 11/03/23  4:57 AM   Specimen: BLOOD  Result Value Ref Range Status   Specimen Description   Final    BLOOD SITE NOT SPECIFIED Performed at [facility], [address] [place], [place] [address]    Special  Requests   Final    BOTTLES DRAWN AEROBIC AND ANAEROBIC Blood Culture results may not be optimal due to an inadequate volume of blood received in culture bottles Performed at [facility], [address] [place], [place] [address]    Culture   Final    NO GROWTH 3 DAYS Performed at [facility], [address], [place], [place] [address]    Report Status PENDING  Incomplete  Blood Culture (routine x 2)     Status: None (Preliminary result)   Collection Time: 11/03/23  5:01 AM   Specimen: BLOOD LEFT HAND  Result Value Ref Range Status   Specimen Description   Final    BLOOD LEFT HAND Performed at [facility], [address] [place], [place] [address]    Special Requests   Final    BOTTLES DRAWN AEROBIC AND ANAEROBIC Blood Culture results may not be optimal due to an inadequate volume of blood received in culture bottles Performed at [facility], [address] [place], [place] [address]    Culture   Final    NO GROWTH 3 DAYS Performed at [facility], [address] [place], [place] [address]    Report Status PENDING  Incomplete  MRSA Next Gen by PCR, Nasal     Status: Abnormal   Collection Time: 11/04/23 10:48 AM   Specimen: Nasal Mucosa; Nasal Swab  Result Value Ref Range Status   MRSA by PCR Next Gen DETECTED (A) NOT DETECTED Final    Comment: CRITICAL RESULT CALLED TO, READ BACK BY AND VERIFIED WITH: Manuella Ghazi RN @ 1150 ON  11/04/23 CAL (NOTE) The GeneXpert MRSA Assay (FDA approved for NASAL specimens only), is one component of a comprehensive MRSA colonization surveillance program. It is not intended to diagnose MRSA infection nor to guide or monitor treatment for MRSA infections. Test performance is not FDA approved in patients less than 56 years old. Performed at [facility], [address], [place], [place] [address]          Radiology Studies: VAS [place] UPPER EXTREMITY VENOUS DUPLEX Result Date: 11/04/2023 UPPER VENOUS STUDY  Patient Name:  [name]  Date of Exam:   11/04/2023 Medical Rec #: [id number]       Accession #:    [id number] Date of Birth: May 28, 1961        Patient Gender: F Patient Age:   31 years Exam Location:  [facility] Procedure:      VAS [place] UPPER EXTREMITY VENOUS DUPLEX Referring Phys: PARDEEP KHATRI --------------------------------------------------------------------------------  Indications: Swelling, and Pain Risk Factors: Cancer Breast past pregnancy. Comparison Study: None Performing Technologist: Shona Simpson  Examination Guidelines: A complete evaluation includes B-mode imaging, spectral Doppler, color Doppler, and power Doppler as needed of all accessible portions of each vessel. Bilateral testing is considered an integral part of a complete examination. Limited examinations for reoccurring indications may be performed as noted.  Right Findings: +----------+------------+---------+-----------+----------+-------+ RIGHT     CompressiblePhasicitySpontaneousPropertiesSummary +----------+------------+---------+-----------+----------+-------+ IJV           Full       Yes       Yes                      +----------+------------+---------+-----------+----------+-------+ Subclavian    Full       Yes       Yes                      +----------+------------+---------+-----------+----------+-------+  Axillary      Full       Yes       Yes                       +----------+------------+---------+-----------+----------+-------+ Brachial      Full       Yes       Yes                      +----------+------------+---------+-----------+----------+-------+ Radial        Full       Yes       Yes                      +----------+------------+---------+-----------+----------+-------+ Ulnar         Full       Yes       Yes                      +----------+------------+---------+-----------+----------+-------+ Cephalic      Full                 Yes                      +----------+------------+---------+-----------+----------+-------+ Basilic       Full                 Yes                      +----------+------------+---------+-----------+----------+-------+  Left Findings: +----------+------------+---------+-----------+----------+-------+ LEFT      CompressiblePhasicitySpontaneousPropertiesSummary +----------+------------+---------+-----------+----------+-------+ Subclavian    Full       Yes       Yes                      +----------+------------+---------+-----------+----------+-------+  Summary:  Right: No evidence of deep vein thrombosis in the upper extremity. No evidence of superficial vein thrombosis in the upper extremity.  Left: No evidence of thrombosis in the subclavian.  *See table(s) above for measurements and observations.  Diagnosing physician: Coral Else MD Electronically signed by Coral Else MD on 11/04/2023 at 7:21:59 PM.    Final         Scheduled Meds:  sodium chloride   Intravenous Once   arformoterol  15 mcg Nebulization BID   And   umeclidinium bromide  1 puff Inhalation Daily   aspirin  81 mg Oral Daily   atorvastatin  20 mg Oral Daily   Chlorhexidine Gluconate Cloth  6 each Topical Daily   Chlorhexidine Gluconate Cloth  6 each Topical Q0600   dexamethasone  4 mg Oral BID   filgrastim  300 mcg Subcutaneous Daily   levETIRAcetam  750 mg Oral BID   magnesium oxide  400 mg  Oral Q0600   metoprolol tartrate  25 mg Oral Daily   montelukast  10 mg Oral QHS   mupirocin ointment  1 Application Nasal BID   pantoprazole  40 mg Oral Daily   sertraline  50 mg Oral Daily   triamterene-hydrochlorothiazide  1 tablet Oral Daily   Continuous Infusions:  ceFEPime (MAXIPIME) IV 2 g (11/06/23 0327)   vancomycin 1,500 mg (11/05/23 1012)          Glade Lloyd, MD [facility] 11/06/2023, 8:19 AM

## 2023-11-06 NOTE — Progress Notes (Signed)
[name]   DOB:[birth year]   UJ#:[id number]      ASSESSMENT & PLAN:  1.  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 -Currently receiving palliative systemic chemoimmunotherapy with carboplatin, paclitaxel, and Keytruda.  First dose was given 09/28/2023. - Status post SBRT to lung, bone, and brain. -Decadron continue per home dose -Follows closely with medical oncology Dr. Shirline Frees and Dr. [name].   2.  Pancytopenia -Likely multifactorial due to to recent chemo and immunotherapy, malignancy, chronic disease CBC done today 11/06/2023: 0.5>8.6<18   -Continue Neupogen 300 mcg daily to.  Please continue this until ANC is greater than 1500 x 2 days.  First dose was given on 11/05/2023. -Patient did not receive Neulasta as she was no-show for injection appt on 10/30/23.  -Transfuse PRBC for Hgb less than 7.0.  No PRBC transfusion required today -Transfuse platelets for counts and 20K.  Please transfuse 1 unit platelets today 11/06/2023. -Monitor CBC with differential daily   3.  Pneumonia/sepsis -On broad-spectrum antibiotics with cefepime and vancomycin, continue as ordered -per CT angio chest done 11/03/23 new dense nodular consolidation in anterior LUL most consistent with infection.  -Monitor fever curve, remains afebrile   4.  History of breast carcinoma -Right breast cancer in 2016 and she is status post lumpectomy - Also received adjuvant radiotherapy and hormonal therapy with anastrozole - Followed Dr. Johny Shock medical oncology   5.  Hypertension - Continue antihypertensives -Continue to monitor blood pressure closely - Medicine following  Code Status DNR-INTERVENTIONS    Subjective: Patient Patient seen awake laying in bed.  Appears more alert today than she did yesterday although weak appearing.   Patient admits to ongoing fatigue.  Denies active bleeding.  No acute distress noted.  Objective:  Vitals:   11/06/23 0829 11/06/23 0902  BP: (!) 151/79   Pulse: 75   Resp: 18   Temp: 97.8 F (36.6 C)   SpO2: 100% 99%     Intake/Output Summary (Last 24 hours) at 11/06/2023 1152 Last data filed at 11/06/2023 9562 Gross per 24 hour  Intake 945 ml  Output 600 ml  Net 345 ml     REVIEW OF SYSTEMS:   Constitutional: +fatigue, Denies fevers, chills or abnormal night sweats Eyes: Denies blurriness of vision, double vision or watery eyes Ears, nose, mouth, throat, and face: Denies mucositis or sore throat Respiratory: Denies cough, dyspnea or wheezes Cardiovascular: Denies palpitation, chest discomfort or lower extremity swelling Gastrointestinal:  Denies nausea, heartburn or change in bowel habits Skin: Denies abnormal skin rashes Lymphatics: Denies new lymphadenopathy or easy bruising Neurological: Denies numbness, tingling or new weaknesses Behavioral/Psych: Mood is stable, no new changes  All other systems were reviewed with the patient and are negative.  PHYSICAL EXAMINATION: ECOG PERFORMANCE STATUS: 3 - Symptomatic, >50% confined to bed  Vitals:   11/06/23 0829 11/06/23 0902  BP: (!) 151/79   Pulse: 75   Resp: 18   Temp: 97.8 F (36.6 C)   SpO2: 100% 99%   Filed Weights   11/03/23 1318  Weight: 150 lb 5.7 oz (68.2 kg)    GENERAL: alert, +weak appearing, no distress and comfortable SKIN: skin color, texture, turgor are normal, no rashes or significant lesions EYES: normal, conjunctiva are pink and non-injected, sclera clear OROPHARYNX: no exudate, no erythema and lips, buccal mucosa, and tongue normal  NECK: supple, thyroid normal size, non-tender, without nodularity LYMPH: no palpable lymphadenopathy in the cervical, axillary or inguinal LUNGS: clear to auscultation and percussion with normal breathing effort HEART: regular rate & rhythm and no murmurs and no  lower extremity edema ABDOMEN: abdomen soft, non-tender and normal bowel sounds MUSCULOSKELETAL: no cyanosis of digits and no clubbing  PSYCH: alert & oriented x 3 with fluent speech NEURO: no focal motor/sensory deficits   All questions were answered. The patient knows to call the clinic with any problems, questions or concerns.   The total time spent in the appointment was 40 minutes encounter with patient including review of chart and various tests results, discussions about plan of care and coordination of care plan  Dawson Bills, NP 11/06/2023 11:52 AM    Labs Reviewed:  Lab Results  Component Value Date   WBC 0.5 (LL) 11/06/2023   HGB 8.6 (L) 11/06/2023   HCT 26.4 (L) 11/06/2023   MCV 86.6 11/06/2023   PLT 18 (LL) 11/06/2023   Recent Labs    10/25/23 0848 11/03/23 0313 11/03/23 0447 11/04/23 0410 11/06/23 0121  NA 144 141 138 136 137  K 3.9 4.0 4.0 3.8 3.3*  CL 101 97* 95* 98 100  CO2 40* 35*  --  29 27  GLUCOSE 86 76 73 108* 101*  BUN 8 14 12 15 13   CREATININE 0.73 0.80 0.80 0.65 0.71  CALCIUM 8.6* 8.5*  --  8.2* 8.0*  GFRNONAA >60 >60  --  >60 >60  PROT 5.6* 5.8*  --   --  5.4*  ALBUMIN 3.2* 3.0*  --   --  2.5*  AST 19 20  --   --  12*  ALT 16 15  --   --  12  ALKPHOS 99 60  --   --  48  BILITOT 0.6 0.8  --   --  0.8    Studies Reviewed:  VAS [place] UPPER EXTREMITY VENOUS DUPLEX Result Date: 11/04/2023 UPPER VENOUS STUDY  Patient Name:  [name]  Date of Exam:   11/04/2023 Medical Rec #: [id number]       Accession #:    [id number] Date of Birth: [birth year]        Patient Gender: F Patient Age:   62 years Exam Location:  [facility] Procedure:      VAS [place] UPPER EXTREMITY VENOUS DUPLEX Referring Phys: PARDEEP KHATRI --------------------------------------------------------------------------------  Indications: Swelling, and Pain Risk Factors: Cancer Breast past pregnancy. Comparison Study: None Performing Technologist: Shona Simpson  Examination  Guidelines: A complete evaluation includes B-mode imaging, spectral Doppler, color Doppler, and power Doppler as needed of all accessible portions of each vessel. Bilateral testing is considered an integral part of a complete examination. Limited examinations for reoccurring indications may be performed as noted.  Right Findings: +----------+------------+---------+-----------+----------+-------+ RIGHT     CompressiblePhasicitySpontaneousPropertiesSummary +----------+------------+---------+-----------+----------+-------+ IJV           Full       Yes       Yes                      +----------+------------+---------+-----------+----------+-------+ Subclavian    Full       Yes       Yes                      +----------+------------+---------+-----------+----------+-------+ Axillary      Full       Yes  Yes                      +----------+------------+---------+-----------+----------+-------+ Brachial      Full       Yes       Yes                      +----------+------------+---------+-----------+----------+-------+ Radial        Full       Yes       Yes                      +----------+------------+---------+-----------+----------+-------+ Ulnar         Full       Yes       Yes                      +----------+------------+---------+-----------+----------+-------+ Cephalic      Full                 Yes                      +----------+------------+---------+-----------+----------+-------+ Basilic       Full                 Yes                      +----------+------------+---------+-----------+----------+-------+  Left Findings: +----------+------------+---------+-----------+----------+-------+ LEFT      CompressiblePhasicitySpontaneousPropertiesSummary +----------+------------+---------+-----------+----------+-------+ Subclavian    Full       Yes       Yes                       +----------+------------+---------+-----------+----------+-------+  Summary:  Right: No evidence of deep vein thrombosis in the upper extremity. No evidence of superficial vein thrombosis in the upper extremity.  Left: No evidence of thrombosis in the subclavian.  *See table(s) above for measurements and observations.  Diagnosing physician: Coral Else MD Electronically signed by Coral Else MD on 11/04/2023 at 7:21:59 PM.    Final    CT Angio Chest/Abd/Pel for Dissection W and/or Wo Contrast Result Date: 11/03/2023 CLINICAL DATA:  Abdominal pain, tachycardia, pulsatile abdominal mass history of lung cancer * Tracking Code: BO * EXAM: CT ANGIOGRAPHY CHEST, ABDOMEN AND PELVIS TECHNIQUE: Non-contrast CT of the chest was initially obtained. Multidetector CT imaging through the chest, abdomen and pelvis was performed using the standard protocol during bolus administration of intravenous contrast. Multiplanar reconstructed images and MIPs were obtained and reviewed to evaluate the vascular anatomy. RADIATION DOSE REDUCTION: This exam was performed according to the departmental dose-optimization program which includes automated exposure control, adjustment of the mA and/or kV according to patient size and/or use of iterative reconstruction technique. CONTRAST:  OMNIPAQUE IOHEXOL 350 MG/ML SOLN COMPARISON:  CT chest, 09/19/2023, CT abdomen pelvis, 10/13/2023 FINDINGS: CTA CHEST FINDINGS VASCULAR Aorta: Satisfactory opacification of the aorta. Normal contour and caliber of the thoracic aorta. No evidence of aneurysm, dissection, or other acute aortic pathology. Moderate aortic atherosclerosis Cardiovascular: Right chest port catheter. No evidence of pulmonary embolism on limited non-tailored examination. Normal heart size. Enlargement of the main pulmonary artery measuring up to 3.9 cm in caliber. No pericardial effusion. Review of the MIP images confirms the above findings. NON VASCULAR Mediastinum/Nodes: No  enlarged mediastinal, hilar, or axillary lymph nodes. Thyroid gland, trachea, and esophagus demonstrate no significant findings. Lungs/Pleura: Small right pleural  effusion, similar to prior examination. Perhaps slightly diminished size of a cavitary mass of the right upper lobe measuring 4.9 x 3.2 cm, previously 4.8 x 3.6 cm, with somewhat increased cavitation (series 12, image 34). New, dense nodular consolidation in the anterior left upper lobe measuring 2.4 x 1.8 cm (series 12, image 55). Diffuse bilateral bronchial wall thickening. Bandlike scarring of the bilateral lung bases. Musculoskeletal: No chest wall abnormality. No acute osseous findings. Review of the MIP images confirms the above findings. CTA ABDOMEN AND PELVIS FINDINGS VASCULAR Normal contour and caliber of the abdominal aorta. No evidence of aneurysm, dissection, or other acute aortic pathology. Standard branching pattern of the abdominal aorta with solitary bilateral renal arteries. Moderate aortic atherosclerosis. Review of the MIP images confirms the above findings. NON-VASCULAR Hepatobiliary: No significant change in hypodense lesions of the posterior liver, largest measuring 1.9 x 1.5 cm (series 22, image 163). No gallstones, gallbladder wall thickening, or biliary dilatation. Pancreas: Unremarkable. No pancreatic ductal dilatation or surrounding inflammatory changes. Spleen: Normal in size without significant abnormality. Adrenals/Urinary Tract: Adrenal glands are unremarkable. Simple, benign renal cortical cysts, for which no further follow-up or characterization is required. Kidneys are otherwise normal, without renal calculi, solid lesion, or hydronephrosis. Bladder is unremarkable. Stomach/Bowel: Stomach is within normal limits. Appendix not clearly visualized and may be surgically absent. No evidence of bowel wall thickening, distention, or inflammatory changes. Lymphatic: No enlarged abdominal or pelvic lymph nodes. Reproductive: Status  post hysterectomy Other: No abdominal wall hernia or abnormality. No ascites. Musculoskeletal: No acute osseous findings. IMPRESSION: 1. Normal contour and caliber of the thoracic and abdominal aorta. No evidence of aneurysm, dissection, or other acute aortic pathology. Moderate aortic atherosclerosis. 2. Perhaps slightly diminished size of a cavitary mass of the right upper lobe with somewhat increased cavitation. Findings are consistent with known primary lung malignancy. 3. New, dense nodular consolidation in the anterior left upper lobe measuring 2.4 x 1.8 cm. Findings are most consistent with infection rapid interval development. 4. Small right pleural effusion, similar to prior examination. 5. No significant change in hypodense lesions of the posterior liver, largest measuring 1.9 x 1.5 cm. These remain suspicious for metastases. 6. Enlargement of the main pulmonary artery, as can be seen in pulmonary hypertension. Aortic Atherosclerosis (ICD10-I70.0). Electronically Signed   By: Jearld Lesch M.D.   On: 11/03/2023 09:28   DG Chest Portable 1 View Result Date: 11/03/2023 CLINICAL DATA:  Sepsis. Abdominal pain for 3 days. History of non-small cell lung cancer. EXAM: PORTABLE CHEST 1 VIEW COMPARISON:  10/12/2023 FINDINGS: Right chest wall porta catheter is noted with tip in the distal SVC. Heart size and mediastinal contours are stable. Unchanged small right pleural effusion. Right apical cavitary lung mass with surrounding pleuroparenchymal scarring is unchanged. Left lung is clear. No acute abnormality. IMPRESSION: 1. No acute findings. 2. Persistent small right pleural effusion. 3. Unchanged right apical cavitary lung mass with surrounding pleuroparenchymal scarring. Electronically Signed   By: Signa Kell M.D.   On: 11/03/2023 08:16   DG Lumbar Spine Complete Result Date: 10/20/2023 CLINICAL DATA:  Fall, low back pain EXAM: LUMBAR SPINE - COMPLETE 4+ VIEW COMPARISON:  02/18/2020, CT 10/13/2023  FINDINGS: Normal alignment. No fracture. Early degenerative changes in the lower lumbar spine with slight disc space narrowing and early spurring. Vascular calcifications. No visible aneurysm. IMPRESSION: No acute bony abnormality. Electronically Signed   By: Charlett Nose M.D.   On: 10/20/2023 03:34   CT Cervical Spine Wo Contrast Result Date:  10/20/2023 CLINICAL DATA:  Polytrauma, blunt.  Fall. EXAM: CT CERVICAL SPINE WITHOUT CONTRAST TECHNIQUE: Multidetector CT imaging of the cervical spine was performed without intravenous contrast. Multiplanar CT image reconstructions were also generated. RADIATION DOSE REDUCTION: This exam was performed according to the departmental dose-optimization program which includes automated exposure control, adjustment of the mA and/or kV according to patient size and/or use of iterative reconstruction technique. COMPARISON:  None Available. FINDINGS: Alignment: Normal Skull base and vertebrae: No acute fracture. No primary bone lesion or focal pathologic process. Soft tissues and spinal canal: No prevertebral fluid or swelling. No visible canal hematoma. Disc levels: Mild degenerative disc disease in the lower cervical spine. No disc herniation. Upper chest: Again partially visualized is the right upper lobe mass and layering large right pleural effusion. Other: None IMPRESSION: No acute bony abnormality. Known right apical mass and large layering right pleural effusion. Electronically Signed   By: Charlett Nose M.D.   On: 10/20/2023 02:08   CT Head Wo Contrast Result Date: 10/20/2023 CLINICAL DATA:  Polytrauma, blunt.  Fall. EXAM: CT HEAD WITHOUT CONTRAST TECHNIQUE: Contiguous axial images were obtained from the base of the skull through the vertex without intravenous contrast. RADIATION DOSE REDUCTION: This exam was performed according to the departmental dose-optimization program which includes automated exposure control, adjustment of the mA and/or kV according to patient  size and/or use of iterative reconstruction technique. COMPARISON:  None Available. FINDINGS: Brain: Again noted are areas of edema within the left temporal lobe and left occipital lobe in areas of prior known metastases. No areas of hemorrhage. No hydrocephalus. No midline shift., Improved since prior study Vascular: No hyperdense vessel or unexpected calcification. Skull: No acute calvarial abnormality. 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 No acute findings. Electronically Signed   By: Charlett Nose M.D.   On: 10/20/2023 02:05   DG HIP UNILAT WITH PELVIS 2-3 VIEWS RIGHT Result Date: 10/18/2023 CLINICAL DATA:  Fall EXAM: DG HIP (WITH OR WITHOUT PELVIS) 2-3V RIGHT COMPARISON:  CT 10/13/2023 FINDINGS: No fracture or malalignment.  Moderate hip arthritis. IMPRESSION: No acute osseous abnormality. Electronically Signed   By: Jasmine Pang M.D.   On: 10/18/2023 23:45   DG HIP UNILAT WITH PELVIS 2-3 VIEWS LEFT Result Date: 10/18/2023 CLINICAL DATA:  Fall EXAM: DG HIP (WITH OR WITHOUT PELVIS) 2-3V LEFT COMPARISON:  CT 10/13/2023 FINDINGS: SI joints are non widened. Pubic symphysis and rami are intact. No acute fracture or malalignment. IMPRESSION: No acute osseous abnormality Electronically Signed   By: Jasmine Pang M.D.   On: 10/18/2023 23:44   US BIOPSY (LIVER) Result Date: 10/18/2023 INDICATION: 62 year old female with a history liver lesion, possible tumor versus abscess EXAM: ULTRASOUND-GUIDED LIVER LESION BIOPSY MEDICATIONS: None. ANESTHESIA/SEDATION: Moderate (conscious) sedation was employed during this procedure. A total of Versed 1.5 mg and Fentanyl 50 mcg was administered intravenously by the radiology nurse. Total intra-service moderate Sedation Time: 18 minutes. The patient's level of consciousness and vital signs were monitored continuously by radiology nursing throughout the  procedure under my direct supervision. COMPLICATIONS: NONE PROCEDURE: Informed written consent was obtained from the patient after a thorough discussion of the procedural risks, benefits and alternatives. All questions were addressed. Maximal Sterile Barrier Technique was utilized including caps, mask, sterile gowns, sterile gloves, sterile drape, hand hygiene and skin antiseptic. A timeout was performed prior to the initiation of the procedure. Ultrasound survey of the right liver  lobe performed with images stored and sent to PACs. The lesion in segment 6 is hypoechoic and quite small. The only way to visualize the lesion for a possible biopsy was from a medial to lateral approach from the anterior. The right lower thorax/right upper abdomen was prepped with chlorhexidine in a sterile fashion, and a sterile drape was applied covering the operative field. A sterile gown and sterile gloves were used for the procedure. Local anesthesia was provided with 1% Lidocaine. The patient was prepped and draped sterilely and the skin and subcutaneous tissues were generously infiltrated with 1% lidocaine. A 17 gauge introducer needle was then advanced under ultrasound guidance in a subcostal location into the right liver lobe. The stylet was removed, and aspiration was attempted. No aspirate returned. Multiple separate 18 gauge core biopsy were then retrieved. Samples were placed into formalin for transportation to the lab. The needle was removed, and a final ultrasound image was performed. The patient tolerated the procedure well and remained hemodynamically stable throughout. No complications were encountered and no significant blood loss was encounter. IMPRESSION: Status post ultrasound-guided biopsy of right liver lesion. Aspiration was attempted, with no aspirate achieved. Signed, Yvone Neu. Miachel Roux, RPVI [facility] Electronically Signed   By: Gilmer Mor  D.O.   On: 10/18/2023 11:23   MR ABDOMEN W WO CONTRAST Result Date: 10/14/2023 CLINICAL DATA:  Liver lesion EXAM: MRI ABDOMEN WITHOUT AND WITH CONTRAST TECHNIQUE: Multiplanar multisequence MR imaging of the abdomen was performed both before and after the administration of intravenous contrast. CONTRAST:  7mL GADAVIST GADOBUTROL 1 MMOL/ML IV SOLN COMPARISON:  CT 10/13/2023.  Older CT 02/19/2019. FINDINGS: Lower chest: Tiny left and small right pleural effusion. Heart enlarged is some linear signal changes in the lung bases. Atelectasis or scarring. Hepatobiliary: No biliary ductal dilatation. Gallbladder is nondilated. Patent portal vein. Complex lesion seen posteriorly in the right hepatic lobe segment 6/7 is again identified today. This lesion has a fluid-fluid level measures 16 by 12 mm. There is some marginal enhancement of the lesion as well as some mild adjacent edema. Again differential would include an abscess. Smaller lesion identified just anterior and superior to this lesion is dark on precontrast T1, bright on T2 and also has some marginal enhancement measuring 12 mm. The lesion seen in segment 7 more superiorly is less visible today but again there is significant breathing motion Pancreas: No mass, inflammatory changes, or other parenchymal abnormality identified. Spleen: Small splenic lesion again identified posteriorly which is dark on T1, moderately bright on T2. Assessment of enhancement is difficult with motion. Again lesion measures 8 mm. Adrenals/Urinary Tract: Adrenal glands are preserved. Benign bilateral renal cystic foci are identified. Stomach/Bowel: Visualized bowel is nondilated. The stranding along the course of the mid descending colon is again noted. Vascular/Lymphatic: Mild atherosclerotic changes along the aorta. Normal caliber aorta and IVC. No discrete abnormal lymph node enlargement in the visualized abdomen. Other:  Evaluation significantly limited by motion. Musculoskeletal:  Degenerative changes along the spine. IMPRESSION: Three liver lesions identified. Once again the dominant focus posteriorly in the right hepatic lobe has a rim enhancement as well as abnormal diffusion. This has concerning for abscess over a malignant lesion. The 2 other smaller lesions are more indeterminate and would recommend close follow-up. Indeterminate small splenic lesion.  Attention on follow-up. Small right and tiny left pleural effusion. Evaluation significantly limited by motion. Electronically Signed   By: Karen Kays M.D.   On: 10/14/2023  17:14   CT ABDOMEN PELVIS W CONTRAST Result Date: 10/13/2023 CLINICAL DATA:  Lung cancer.  Sepsis. EXAM: CT ABDOMEN AND PELVIS WITH CONTRAST TECHNIQUE: Multidetector CT imaging of the abdomen and pelvis was performed using the standard protocol following bolus administration of intravenous contrast. RADIATION DOSE REDUCTION: This exam was performed according to the departmental dose-optimization program which includes automated exposure control, adjustment of the mA and/or kV according to patient size and/or use of iterative reconstruction technique. CONTRAST:  75mL OMNIPAQUE IOHEXOL 300 MG/ML  SOLN COMPARISON:  PET-CT 08/03/2023. standard CT April 2020 FINDINGS: Lower chest: Bronchiectasis seen along the lung bases. Breathing motion. Dependent areas of atelectasis and scarring. Trace pleural fluid on the right. Right-sided fat containing diaphragmatic hernia again seen. Hepatobiliary: Nodular contours of the liver. Cystic lesion posterior along the right hepatic lobe on series 3, image 21 has a capsule and some associated parenchymal edema surrounding lesion measuring 19 x 16 mm. Small focus just above this well but more simple appearing measuring 10 mm and a vague low-density area in segment 7 measuring 7 mm on series 3, image 10. These are not clearly seen on the previous examinations. Patent portal vein. Gallbladder is mildly distended. Pancreas:  Unremarkable. No pancreatic ductal dilatation or surrounding inflammatory changes. Spleen: Spleen is nonenlarged. There is a cystic area posterior along the spleen which was not seen previously measuring 8 mm. Not clearly a simple cyst Adrenals/Urinary Tract: Adrenal glands are preserved. Benign bilateral renal cystic foci identified, Bosniak 1 and 2 lesions. No enhancing mass or collecting system dilatation. The ureters have normal course and caliber extending down to the urinary bladder. Preserved contours of the urinary bladder. Stomach/Bowel: Stomach is decompressed. Small bowel is nondilated. The large bowel has a normal course and caliber. Left-sided areas of wall thickening identified with diverticula and stranding. Please correlate for a colitis. Mild changes as well along the sigmoid and rectum. Fluid along the lumen of the right side of the colon normal appendix seen in the right hemipelvis. Posteromedial to the cecum. Vascular/Lymphatic: Aortic atherosclerosis. No enlarged abdominal or pelvic lymph nodes. Reproductive: Status post hysterectomy. No adnexal masses. Other: Anasarca. Musculoskeletal: Degenerative changes seen of the spine and pelvis particularly of the right hip. IMPRESSION: New complex cystic areas in the right hepatic lobe. Based on appearance this has worrisome for potential abscess formation but there is a differential. Please correlate with clinical presentation. If needed MRI evaluation could be considered. Largest measures 19 mm in the right hepatic lobe. Small cystic area in the spleen is mildly complex and more indeterminate. This also could be assessed on follow-up. Wall thickening with stranding along the descending colon greater than the rectosigmoid colon. Please correlate for a colitis. No obstruction. Recommend follow-up. Tiny right pleural effusion. Electronically Signed   By: Karen Kays M.D.   On: 10/13/2023 14:21   DG Chest Port 1 View Result Date: 10/12/2023 CLINICAL  DATA:  Possible sepsis. EXAM: PORTABLE CHEST 1 VIEW COMPARISON:  09/17/2023 and older exams.  CT, 09/19/2023. FINDINGS: Cardiac silhouette is normal in size. No convincing mediastinal or hilar masses. Right anterior chest wall Port-A-Cath is stable. Right-sided volume loss with mediastinal shift, stable. There is an ill-defined masslike opacity in the right upper lobe, unchanged. Stable interstitial type opacities right mid lung and both lung bases. No new lung abnormalities. No convincing pleural effusion.  No pneumothorax. Skeletal structures are grossly intact. Surgical vascular clips on the right reflect prior breast surgery, also stable IMPRESSION: 1. No significant change  from the recent prior chest radiograph. 2. 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 3. Right-sided volume loss with mediastinal shift. 4. No new abnormalities. Electronically Signed   By: Amie Portland M.D.   On: 10/12/2023 08:45

## 2023-11-06 NOTE — Progress Notes (Signed)
Pharmacy Antibiotic Note  [name] is a 61 y.o. female admitted on 11/03/2023 with pneumonia.  Pharmacy has been consulted for cefepime and vancomycin dosing.  Today, 11/06/23 D4 antibiotics Remains neutropenic Afebrile SCr 0.71, WNL/stable  Plan: Continue Cefepime 2g IV q8h Continue Vancomycin 1500mg  IV q24h to keep AUC 400-550 Monitor renal function, cultures, clinical course, and for de-escalation   Height: 5\' 5"  (165.1 cm) Weight: 68.2 kg (150 lb 5.7 oz) IBW/kg (Calculated) : 57  Temp (24hrs), Avg:98 F (36.7 C), Min:97.8 F (36.6 C), Max:98.3 F (36.8 C)  Recent Labs  Lab 11/03/23 0313 11/03/23 0447 11/03/23 0452 11/03/23 0807 11/04/23 0410 11/05/23 0212 11/06/23 0121  WBC 1.2*  --   --   --  1.0* 0.4* 0.5*  CREATININE 0.80 0.80  --   --  0.65  --  0.71  LATICACIDVEN  --   --  1.7 1.6  --   --   --     Estimated Creatinine Clearance: 64.8 mL/min (by C-G formula based on SCr of 0.71 mg/dL).    Allergies  Allergen Reactions   Codeine Other (See Comments)    Avoids-felt bad when taken before Pt claims she can have this but does not like to take    Antimicrobials this admission: 12/21 Metronidazole x 1 12/21 Vancomycin >>  12/21 Cefepime >>  Dose adjustments this admission: --  Microbiology results: 12/21 BCx: ngtd 12/22 MRSA PCR: detected    Greer Pickerel, PharmD, BCPS Clinical Pharmacist 11/06/2023 7:49 AM

## 2023-11-07 DIAGNOSIS — A419 Sepsis, unspecified organism: Secondary | ICD-10-CM | POA: Diagnosis not present

## 2023-11-07 DIAGNOSIS — J189 Pneumonia, unspecified organism: Secondary | ICD-10-CM | POA: Diagnosis not present

## 2023-11-07 LAB — BASIC METABOLIC PANEL
Anion gap: 4 — ABNORMAL LOW (ref 5–15)
BUN: 11 mg/dL (ref 8–23)
CO2: 32 mmol/L (ref 22–32)
Calcium: 8.2 mg/dL — ABNORMAL LOW (ref 8.9–10.3)
Chloride: 99 mmol/L (ref 98–111)
Creatinine, Ser: 0.72 mg/dL (ref 0.44–1.00)
GFR, Estimated: >60 mL/min (ref >60)
Glucose, Bld: 119 mg/dL — ABNORMAL HIGH (ref 70–99)
Potassium: 3.3 mmol/L — ABNORMAL LOW (ref 3.5–5.1)
Sodium: 135 mmol/L (ref 135–145)

## 2023-11-07 LAB — CBC WITH DIFFERENTIAL/PLATELET
Abs Immature Granulocytes: 0.36 10*3/uL — ABNORMAL HIGH (ref 0.00–0.07)
Basophils Absolute: 0 10*3/uL (ref 0.0–0.1)
Basophils Relative: 0 %
Eosinophils Absolute: 0 10*3/uL (ref 0.0–0.5)
Eosinophils Relative: 0 %
HCT: 24.8 % — ABNORMAL LOW (ref 36.0–46.0)
Hemoglobin: 8.4 g/dL — ABNORMAL LOW (ref 12.0–15.0)
Immature Granulocytes: 34 %
Lymphocytes Relative: 16 %
Lymphs Abs: 0.2 10*3/uL — ABNORMAL LOW (ref 0.7–4.0)
MCH: 29.3 pg (ref 26.0–34.0)
MCHC: 33.9 g/dL (ref 30.0–36.0)
MCV: 86.4 fL (ref 80.0–100.0)
Monocytes Absolute: 0.1 10*3/uL (ref 0.1–1.0)
Monocytes Relative: 11 %
Neutro Abs: 0.4 10*3/uL — CL (ref 1.7–7.7)
Neutrophils Relative %: 39 %
Platelets: 24 10*3/uL — CL (ref 150–400)
RBC: 2.87 MIL/uL — ABNORMAL LOW (ref 3.87–5.11)
RDW: 14.8 % (ref 11.5–15.5)
WBC: 1.1 10*3/uL — CL (ref 4.0–10.5)
nRBC: 0 % (ref 0.0–0.2)

## 2023-11-07 LAB — BPAM PLATELET PHERESIS
Blood Product Expiration Date: 202412262359
ISSUE DATE / TIME: 202412241440
Unit Type and Rh: 5100

## 2023-11-07 LAB — PREPARE PLATELET PHERESIS: Unit division: 0

## 2023-11-07 LAB — MAGNESIUM: Magnesium: 1.4 mg/dL — ABNORMAL LOW (ref 1.7–2.4)

## 2023-11-07 MED ORDER — OXYMETAZOLINE HCL 0.05 % NA SOLN
2.0000 | Freq: Four times a day (QID) | NASAL | Status: DC | PRN
  Administered 2023-11-07: 2 via NASAL
  Filled 2023-11-07: qty 15

## 2023-11-07 NOTE — Progress Notes (Addendum)
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  She was started on broad-spectrum antibiotics.  Oncology was consulted.  Assessment & Plan:   Sepsis: Present on admission Probable Healthcare associated bacterial pneumonia: Organism unknown -Currently on cefepime and vancomycin.  Blood cultures are negative so far. -Hemodynamically improving.  COPD Chronic respiratory failure with hypoxia -Respiratory status remained stable at baseline 2 L oxygen via nasal cannula.  No evidence of COPD exacerbation -Continue current inhaled regimen  Stage IV non-small cell lung cancer Pancytopenia -Follows up with oncology/neuro-oncology and radiation oncology as an outpatient.  Continue home dose Decadron.  Currently on outpatient palliative chemotherapy overall prognosis is guarded to poor. -Received 2 units of packed red cells transfusion since hospitalization.  Hemoglobin 8.4 today.  Has persistent neutropenia and thrombocytopenia.  She is on Granix currently as per oncology recommendations.  Received 1 unit of platelets on 11/06/2023 for platelets of 18.  Platelets 24 today.  Monitor.  Hypomagnesemia -Replace.  Repeat a.m. labs.  Hypokalemia -Replace.  Repeat a.m. labs  Hypertension Hyperlipidemia -Continue current regimen  Depression--continue sertraline  Physical deconditioning -PT recommended home health PT  DVT prophylaxis: SCDs Code Status: DNR Family Communication: None at bedside Disposition Plan: Status is: Inpatient Remains inpatient  appropriate because: Of severity of illness  Consultants: oncology  Procedures: None  Antimicrobials:  Anti-infectives (From admission, onward)    Start     Dose/Rate Route Frequency Ordered Stop   11/04/23 1100  Vancomycin (VANCOCIN) 1,500 mg in sodium chloride 0.9 % 500 mL IVPB        1,500 mg 250 mL/hr over 120 Minutes Intravenous Every 24 hours 11/03/23 1031     11/03/23 1200  ceFEPIme (MAXIPIME) 2 g in sodium chloride 0.9 % 100 mL IVPB        2 g 200 mL/hr over 30 Minutes Intravenous Every 8 hours 11/03/23 1031     11/03/23 1030  Vancomycin (VANCOCIN) 1,500 mg in sodium chloride 0.9 % 500 mL IVPB        1,500 mg 250 mL/hr over 120 Minutes Intravenous  Once 11/03/23 1027 11/03/23 1301   11/03/23 0330  ceFEPIme (MAXIPIME) 2 g in sodium chloride 0.9 % 100 mL IVPB        2 g 200 mL/hr over 30 Minutes Intravenous  Once 11/03/23 0318 11/03/23 0428   11/03/23 0330  metroNIDAZOLE (FLAGYL) IVPB 500 mg        500 mg 100 mL/hr over 60 Minutes Intravenous  Once 11/03/23 0318 11/03/23 0542        Subjective: Patient seen and examined at bedside.  Denies worsening shortness of breath, fever or vomiting. Objective: Vitals:   11/06/23 2051 11/07/23 0443 11/07/23 0455 11/07/23 0755  BP:  (!) 163/83    Pulse:  78    Resp:  17    Temp:  99.1 F (37.3 C)    TempSrc:  Oral    SpO2: 92% 100%  98%  Weight:   73.6 kg   Height:        Intake/Output Summary (Last 24 hours) at  11/07/2023 0809 Last data filed at 11/07/2023 0336 Gross per 24 hour  Intake 1458 ml  Output 1400 ml  Net 58 ml   Filed Weights   11/03/23 1318 11/07/23 0455  Weight: 68.2 kg 73.6 kg    Examination:  General: No acute distress.  Remains on 2 L oxygen via nasal cannula.  Chronically ill and deconditioned looking. ENT/neck: No palpable neck masses or JVD elevation noted  respiratory: Bilateral decreased breath sounds at bases with scattered crackles CVS: Rate mostly controlled; S1 and S2 are  heard Abdominal: Soft, nontender, distended mildly; no organomegaly, bowel sounds are heard normally Extremities: No clubbing; mild lower extremity edema present  CNS: Slow to respond.  Alert.  Poor historian.  No focal neurologic deficit.  Able to move extremities Lymph: No obvious palpable lymphadenopathy Skin: No obvious petechiae/rashes psych: Currently not agitated.  Affect is mostly flat  musculoskeletal: No obvious joint tenderness/deformity    Data Reviewed: I have personally reviewed following labs and imaging studies  CBC: Recent Labs  Lab 11/03/23 0313 11/03/23 0447 11/04/23 0410 11/04/23 1605 11/05/23 0212 11/05/23 2006 11/06/23 0121 11/07/23 0322  WBC 1.2*  --  1.0*  --  0.4*  --  0.5* 1.1*  NEUTROABS 0.9*  --   --   --  0.3*  --  0.3* 0.4*  HGB 8.9*   < > 6.4* 7.3* 7.0* 8.7* 8.6* 8.4*  HCT 29.0*   < > 20.0* 21.0* 21.5* 26.2* 26.4* 24.8*  MCV 90.3  --  87.3  --  87.8  --  86.6 86.4  PLT 65*  --  50*  --  31*  --  18* 24*   < > = values in this interval not displayed.   Basic Metabolic Panel: Recent Labs  Lab 11/03/23 0313 11/03/23 0447 11/04/23 0410 11/05/23 0212 11/06/23 0121 11/07/23 0322  NA 141 138 136  --  137 135  K 4.0 4.0 3.8  --  3.3* 3.3*  CL 97* 95* 98  --  100 99  CO2 35*  --  29  --  27 32  GLUCOSE 76 73 108*  --  101* 119*  BUN 14 12 15   --  13 11  CREATININE 0.80 0.80 0.65  --  0.71 0.72  CALCIUM 8.5*  --  8.2*  --  8.0* 8.2*  MG  --   --   --  1.3* 1.8 1.4*  PHOS  --   --   --  2.6  --   --    GFR: Estimated Creatinine Clearance: 72.3 mL/min (by C-G formula based on SCr of 0.72 mg/dL). Liver Function Tests: Recent Labs  Lab 11/03/23 0313 11/06/23 0121  AST 20 12*  ALT 15 12  ALKPHOS 60 48  BILITOT 0.8 0.8  PROT 5.8* 5.4*  ALBUMIN 3.0* 2.5*   Recent Labs  Lab 11/03/23 0313  LIPASE 22   No results for input(s): "AMMONIA" in the last 168 hours. Coagulation Profile: Recent Labs  Lab 11/03/23 0501  INR 1.3*    Cardiac Enzymes: No results for input(s): "CKTOTAL", "CKMB", "CKMBINDEX", "TROPONINI" in the last 168 hours. BNP (last 3 results) No results for input(s): "PROBNP" in the last 8760 hours. HbA1C: No results for input(s): "HGBA1C" in the last 72 hours. CBG: No results for input(s): "GLUCAP" in the last 168 hours. Lipid Profile: No results for input(s): "CHOL", "HDL", "LDLCALC", "TRIG", "CHOLHDL", "LDLDIRECT" in the last 72 hours. Thyroid Function Tests: No results for input(s): "TSH", "T4TOTAL", "  FREET4", "T3FREE", "THYROIDAB" in the last 72 hours. Anemia Panel: No results for input(s): "VITAMINB12", "FOLATE", "FERRITIN", "TIBC", "IRON", "RETICCTPCT" in the last 72 hours. Sepsis Labs: Recent Labs  Lab 11/03/23 0452 11/03/23 0807  LATICACIDVEN 1.7 1.6    Recent Results (from the past 240 hours)  Resp panel by RT-PCR (RSV, Flu A&B, Covid) Anterior Nasal Swab     Status: None   Collection Time: 11/03/23  4:14 AM   Specimen: Anterior Nasal Swab  Result Value Ref Range Status   SARS Coronavirus 2 by RT PCR NEGATIVE NEGATIVE Final    Comment: (NOTE) SARS-CoV-2 target nucleic acids are NOT DETECTED.  The SARS-CoV-2 RNA is generally detectable in upper respiratory specimens during the acute phase of infection. The lowest concentration of SARS-CoV-2 viral copies this assay can detect is 138 copies/mL. A negative result does not preclude SARS-Cov-2 infection and should not be used as the sole basis for treatment or other patient management decisions. A negative result may occur with  improper specimen collection/handling, submission of specimen other than nasopharyngeal swab, presence of viral mutation(s) within the areas targeted by this assay, and inadequate number of viral copies(<138 copies/mL). A negative result must be combined with clinical observations, patient history, and epidemiological information. The expected result is Negative.  Fact Sheet for Patients:   BloggerCourse.com  Fact Sheet for Healthcare Providers:  SeriousBroker.it  This test is no t yet approved or cleared by the [place] FDA and  has been authorized for detection and/or diagnosis of SARS-CoV-2 by FDA under an Emergency Use Authorization (EUA). This EUA will remain  in effect (meaning this test can be used) for the duration of the COVID-19 declaration under Section 564(b)(1) of the Act, 21 U.S.C.section 360bbb-3(b)(1), unless the authorization is terminated  or revoked sooner.       Influenza A by PCR NEGATIVE NEGATIVE Final   Influenza B by PCR NEGATIVE NEGATIVE Final    Comment: (NOTE) The Xpert Xpress SARS-CoV-2/FLU/RSV plus assay is intended as an aid in the diagnosis of influenza from Nasopharyngeal swab specimens and should not be used as a sole basis for treatment. Nasal washings and aspirates are unacceptable for Xpert Xpress SARS-CoV-2/FLU/RSV testing.  Fact Sheet for Patients: BloggerCourse.com  Fact Sheet for Healthcare Providers: SeriousBroker.it  This test is not yet approved or cleared by the [place] FDA and has been authorized for detection and/or diagnosis of SARS-CoV-2 by FDA under an Emergency Use Authorization (EUA). This EUA will remain in effect (meaning this test can be used) for the duration of the COVID-19 declaration under Section 564(b)(1) of the Act, 21 U.S.C. section 360bbb-3(b)(1), unless the authorization is terminated or revoked.     Resp Syncytial Virus by PCR NEGATIVE NEGATIVE Final    Comment: (NOTE) Fact Sheet for Patients: BloggerCourse.com  Fact Sheet for Healthcare Providers: SeriousBroker.it  This test is not yet approved or cleared by the [place] FDA and has been authorized for detection and/or diagnosis of SARS-CoV-2 by FDA under an Emergency Use  Authorization (EUA). This EUA will remain in effect (meaning this test can be used) for the duration of the COVID-19 declaration under Section 564(b)(1) of the Act, 21 U.S.C. section 360bbb-3(b)(1), unless the authorization is terminated or revoked.  Performed at [facility], [address] [place], [place] [address]   Blood Culture (routine x 2)     Status: None (Preliminary result)   Collection Time: 11/03/23  4:57 AM   Specimen: BLOOD  Result Value Ref  Range Status   Specimen Description   Final    BLOOD SITE NOT SPECIFIED Performed at [facility], [address], [place], [place] [address]    Special Requests   Final    BOTTLES DRAWN AEROBIC AND ANAEROBIC Blood Culture results may not be optimal due to an inadequate volume of blood received in culture bottles Performed at [facility], [address] [place], [place] [address]    Culture   Final    NO GROWTH 4 DAYS Performed at [facility], [address] [place], [place] [address]    Report Status PENDING  Incomplete  Blood Culture (routine x 2)     Status: None (Preliminary result)   Collection Time: 11/03/23  5:01 AM   Specimen: BLOOD LEFT HAND  Result Value Ref Range Status   Specimen Description   Final    BLOOD LEFT HAND Performed at [facility], [address] [place], [place] [address]    Special Requests   Final    BOTTLES DRAWN AEROBIC AND ANAEROBIC Blood Culture results may not be optimal due to an inadequate volume of blood received in culture bottles Performed at [facility], [address], [place], [place] [address]    Culture   Final    NO GROWTH 4 DAYS Performed at [facility], [address], [place], [place] [address]    Report Status PENDING  Incomplete  MRSA Next Gen by PCR, Nasal     Status: Abnormal   Collection Time: 11/04/23 10:48 AM   Specimen: Nasal Mucosa; Nasal Swab  Result Value Ref  Range Status   MRSA by PCR Next Gen DETECTED (A) NOT DETECTED Final    Comment: CRITICAL RESULT CALLED TO, READ BACK BY AND VERIFIED WITH: Manuella Ghazi RN @ 1150 ON 11/04/23 CAL (NOTE) The GeneXpert MRSA Assay (FDA approved for NASAL specimens only), is one component of a comprehensive MRSA colonization surveillance program. It is not intended to diagnose MRSA infection nor to guide or monitor treatment for MRSA infections. Test performance is not FDA approved in patients less than 100 years old. Performed at [facility], [address], [place], [place] [address]          Radiology Studies: No results found.       Scheduled Meds:  sodium chloride   Intravenous Once   sodium chloride   Intravenous Once   arformoterol  15 mcg Nebulization BID   And   umeclidinium bromide  1 puff Inhalation Daily   aspirin  81 mg Oral Daily   atorvastatin  20 mg Oral Daily   Chlorhexidine Gluconate Cloth  6 each Topical Daily   Chlorhexidine Gluconate Cloth  6 each Topical Q0600   dexamethasone  4 mg Oral BID   filgrastim  300 mcg Subcutaneous Daily   levETIRAcetam  750 mg Oral BID   magnesium oxide  400 mg Oral Q0600   metoprolol tartrate  25 mg Oral Daily   montelukast  10 mg Oral QHS   mupirocin ointment  1 Application Nasal BID   pantoprazole  40 mg Oral Daily   sertraline  50 mg Oral Daily   triamterene-hydrochlorothiazide  1 tablet Oral Daily   Continuous Infusions:  ceFEPime (MAXIPIME) IV 2 g (11/07/23 0336)   vancomycin 1,500 mg (11/06/23 0951)          Glade Lloyd, MD [facility] 11/07/2023, 8:09 AM

## 2023-11-08 ENCOUNTER — Inpatient Hospital Stay: Payer: 59

## 2023-11-08 ENCOUNTER — Ambulatory Visit: Payer: 59

## 2023-11-08 ENCOUNTER — Inpatient Hospital Stay: Payer: 59 | Admitting: Internal Medicine

## 2023-11-08 DIAGNOSIS — J189 Pneumonia, unspecified organism: Secondary | ICD-10-CM | POA: Diagnosis not present

## 2023-11-08 DIAGNOSIS — A419 Sepsis, unspecified organism: Secondary | ICD-10-CM | POA: Diagnosis not present

## 2023-11-08 LAB — CULTURE, BLOOD (ROUTINE X 2)
Culture: NO GROWTH
Culture: NO GROWTH

## 2023-11-08 LAB — CBC
HCT: 25.5 % — ABNORMAL LOW (ref 36.0–46.0)
Hemoglobin: 8.5 g/dL — ABNORMAL LOW (ref 12.0–15.0)
MCH: 29 pg (ref 26.0–34.0)
MCHC: 33.3 g/dL (ref 30.0–36.0)
MCV: 87 fL (ref 80.0–100.0)
Platelets: 17 10*3/uL — CL (ref 150–400)
RBC: 2.93 MIL/uL — ABNORMAL LOW (ref 3.87–5.11)
RDW: 14.6 % (ref 11.5–15.5)
WBC: 1.7 10*3/uL — ABNORMAL LOW (ref 4.0–10.5)
nRBC: 0 % (ref 0.0–0.2)

## 2023-11-08 LAB — BASIC METABOLIC PANEL
Anion gap: 8 (ref 5–15)
BUN: 13 mg/dL (ref 8–23)
CO2: 29 mmol/L (ref 22–32)
Calcium: 8.5 mg/dL — ABNORMAL LOW (ref 8.9–10.3)
Chloride: 100 mmol/L (ref 98–111)
Creatinine, Ser: 0.63 mg/dL (ref 0.44–1.00)
GFR, Estimated: >60 mL/min (ref >60)
Glucose, Bld: 119 mg/dL — ABNORMAL HIGH (ref 70–99)
Potassium: 3 mmol/L — ABNORMAL LOW (ref 3.5–5.1)
Sodium: 137 mmol/L (ref 135–145)

## 2023-11-08 LAB — CBC WITH DIFFERENTIAL/PLATELET
Abs Immature Granulocytes: 0.34 10*3/uL — ABNORMAL HIGH (ref 0.00–0.07)
Basophils Absolute: 0 10*3/uL (ref 0.0–0.1)
Basophils Relative: 1 %
Eosinophils Absolute: 0 10*3/uL (ref 0.0–0.5)
Eosinophils Relative: 0 %
HCT: 25.2 % — ABNORMAL LOW (ref 36.0–46.0)
Hemoglobin: 8.3 g/dL — ABNORMAL LOW (ref 12.0–15.0)
Immature Granulocytes: 18 %
Lymphocytes Relative: 13 %
Lymphs Abs: 0.2 10*3/uL — ABNORMAL LOW (ref 0.7–4.0)
MCH: 28.4 pg (ref 26.0–34.0)
MCHC: 32.9 g/dL (ref 30.0–36.0)
MCV: 86.3 fL (ref 80.0–100.0)
Monocytes Absolute: 0.2 10*3/uL (ref 0.1–1.0)
Monocytes Relative: 12 %
Neutro Abs: 1 10*3/uL — ABNORMAL LOW (ref 1.7–7.7)
Neutrophils Relative %: 56 %
Platelets: 15 10*3/uL — CL (ref 150–400)
RBC: 2.92 MIL/uL — ABNORMAL LOW (ref 3.87–5.11)
RDW: 14.5 % (ref 11.5–15.5)
WBC: 1.9 10*3/uL — ABNORMAL LOW (ref 4.0–10.5)
nRBC: 0 % (ref 0.0–0.2)

## 2023-11-08 LAB — TYPE AND SCREEN
ABO/RH(D): O POS
Antibody Screen: NEGATIVE

## 2023-11-08 LAB — MAGNESIUM: Magnesium: 1.2 mg/dL — ABNORMAL LOW (ref 1.7–2.4)

## 2023-11-08 LAB — HEMOGLOBIN AND HEMATOCRIT, BLOOD
HCT: 23.6 % — ABNORMAL LOW (ref 36.0–46.0)
Hemoglobin: 8 g/dL — ABNORMAL LOW (ref 12.0–15.0)

## 2023-11-08 MED ORDER — SODIUM CHLORIDE 0.9% IV SOLUTION: Freq: Once | INTRAVENOUS | Status: DC

## 2023-11-08 MED ORDER — POTASSIUM CHLORIDE CRYS ER 20 MEQ PO TBCR
40.0000 meq | EXTENDED_RELEASE_TABLET | ORAL | Status: AC
  Administered 2023-11-08 (×2): 40 meq via ORAL
  Filled 2023-11-08 (×2): qty 2

## 2023-11-08 MED ORDER — SODIUM CHLORIDE 0.9% IV SOLUTION: Freq: Once | INTRAVENOUS | Status: AC

## 2023-11-08 MED ORDER — TBO-FILGRASTIM 300 MCG/0.5ML ~~LOC~~ SOSY
300.0000 ug | PREFILLED_SYRINGE | Freq: Every day | SUBCUTANEOUS | Status: DC
  Administered 2023-11-09: 300 ug via SUBCUTANEOUS
  Filled 2023-11-08: qty 0.5

## 2023-11-08 MED ORDER — MAGNESIUM SULFATE 2 GM/50ML IV SOLN
2.0000 g | Freq: Once | INTRAVENOUS | Status: AC
  Administered 2023-11-08: 2 g via INTRAVENOUS
  Filled 2023-11-08: qty 50

## 2023-11-08 NOTE — Progress Notes (Signed)
IVETH HUTCHISON   DOB:05/28/1961   WU#:[id number]      ASSESSMENT & PLAN:  1.  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 -Has been receiving palliative systemic chemoimmunotherapy with carboplatin, paclitaxel, and Keytruda.  First dose was given 09/28/2023. - Status post SBRT to lung, bone, and brain. -Decadron continue per home dose -Follows closely with medical oncology Dr. Shirline Frees and Dr. [name].   2.  Pancytopenia -Likely multifactorial due to to recent chemo and immunotherapy, malignancy, chronic disease -CBC continues to be low; 1.9 > 8.3 < 15.     - Neupogen 300 mcg daily until ANC >1500 for 2 days.  Initiated on 11/05/2023.  Please continue today. -Patient did not receive Neulasta as she was no-show for injection appt on 10/30/23.  -Transfuse PRBC for Hgb less than 7.0.  No PRBC transfusion required today -Transfuse platelets for counts and 20K.  Platelets continue to be low, at 15K today, please transfuse platelets today 11/08/2023. -Monitor CBC with differential daily   3.  Pneumonia/sepsis -On broad-spectrum antibiotics with cefepime and vancomycin, continue as ordered -per CT angio chest done 11/03/23 new dense nodular consolidation in anterior LUL most consistent with infection.  -Monitor fever curve, remains afebrile.   4.  History of breast carcinoma -Right breast cancer in 2016 and she is status post lumpectomy - Also received adjuvant radiotherapy and hormonal therapy with anastrozole - Followed Dr. Johny Shock medical oncology   5.  Hypertension - Continue antihypertensives -Continue to monitor blood pressure closely - Medicine following  Code Status DNR-INTERVENTIONS   Subjective:  Patient seen awake alert oriented laying in bed.  Weak appearing.  Reports she is not hungry has not  eaten breakfast encouraged patient to eat and educated on importance of nutrition.  Denies new acute complaints.  Objective:  Vitals:   11/08/23 0638 11/08/23 0840  BP: (!) 149/77   Pulse: 78   Resp: 18   Temp: 98.2 F (36.8 C)   SpO2:  100%     Intake/Output Summary (Last 24 hours) at 11/08/2023 7253 Last data filed at 11/08/2023 6644 Gross per 24 hour  Intake 920 ml  Output 400 ml  Net 520 ml     REVIEW OF SYSTEMS:   Constitutional: + Fatigue, denies fevers, chills or abnormal night sweats Eyes: Denies blurriness of vision, double vision or watery eyes Ears, nose, mouth, throat, and face: Denies mucositis or sore throat Respiratory: Denies cough, dyspnea or wheezes Cardiovascular: Denies palpitation, chest discomfort or lower extremity swelling Gastrointestinal:  Denies nausea, heartburn or change in bowel habits Skin: Denies abnormal skin rashes Lymphatics: Denies new lymphadenopathy or easy bruising Neurological: Denies numbness, tingling or new weaknesses Behavioral/Psych: Mood is stable, no new changes  All other systems were reviewed with the patient and are negative.  PHYSICAL EXAMINATION: ECOG PERFORMANCE STATUS: 3 - Symptomatic, >50% confined to bed  Vitals:   11/08/23 0638 11/08/23 0840  BP: (!) 149/77   Pulse: 78   Resp: 18   Temp: 98.2 F (36.8 C)   SpO2:  100%   Filed Weights   11/03/23 1318 11/07/23 0455 11/08/23 0500  Weight: 150 lb 5.7 oz (68.2 kg) 162 lb 4.1 oz (73.6 kg) 163 lb 2.3 oz (74 kg)    GENERAL: alert, + weak appearing, no distress and comfortable SKIN: skin color, texture, turgor are  normal, no rashes or significant lesions EYES: normal, conjunctiva are pink and non-injected, sclera clear OROPHARYNX: no exudate, no erythema and lips, buccal mucosa, and tongue normal  NECK: supple, thyroid normal size, non-tender, without nodularity LYMPH: no palpable lymphadenopathy in the cervical, axillary or inguinal LUNGS: clear to auscultation  and percussion with normal breathing effort HEART: regular rate & rhythm and no murmurs and no lower extremity edema ABDOMEN: abdomen soft, non-tender and normal bowel sounds MUSCULOSKELETAL: no cyanosis of digits and no clubbing  PSYCH: alert & oriented x 3 with fluent speech NEURO: no focal motor/sensory deficits   All questions were answered. The patient knows to call the clinic with any problems, questions or concerns.   The total time spent in the appointment was 40 minutes encounter with patient including review of chart and various tests results, discussions about plan of care and coordination of care plan  Dawson Bills, NP 11/08/2023 9:06 AM    Labs Reviewed:  Lab Results  Component Value Date   WBC 1.9 (L) 11/08/2023   HGB 8.3 (L) 11/08/2023   HCT 25.2 (L) 11/08/2023   MCV 86.3 11/08/2023   PLT 15 (LL) 11/08/2023   Recent Labs    10/25/23 0848 11/03/23 0313 11/03/23 0447 11/06/23 0121 11/07/23 0322 11/08/23 0253  NA 144 141   < > 137 135 137  K 3.9 4.0   < > 3.3* 3.3* 3.0*  CL 101 97*   < > 100 99 100  CO2 40* 35*   < > 27 32 29  GLUCOSE 86 76   < > 101* 119* 119*  BUN 8 14   < > 13 11 13   CREATININE 0.73 0.80   < > 0.71 0.72 0.63  CALCIUM 8.6* 8.5*   < > 8.0* 8.2* 8.5*  GFRNONAA >60 >60   < > >60 >60 >60  PROT 5.6* 5.8*  --  5.4*  --   --   ALBUMIN 3.2* 3.0*  --  2.5*  --   --   AST 19 20  --  12*  --   --   ALT 16 15  --  12  --   --   ALKPHOS 99 60  --  48  --   --   BILITOT 0.6 0.8  --  0.8  --   --    < > = values in this interval not displayed.    Studies Reviewed:  VAS [place] UPPER EXTREMITY VENOUS DUPLEX Result Date: 11/04/2023 UPPER VENOUS STUDY  Patient Name:  HONORIA WINCHESTER  Date of Exam:   11/04/2023 Medical Rec #: [id number]       Accession #:    [id number] Date of Birth: [birth year]        Patient Gender: F Patient Age:   62 years Exam Location:  [facility] Procedure:      VAS [place] UPPER EXTREMITY VENOUS DUPLEX Referring Phys: PARDEEP  KHATRI --------------------------------------------------------------------------------  Indications: Swelling, and Pain Risk Factors: Cancer Breast past pregnancy. Comparison Study: None Performing Technologist: Shona Simpson  Examination Guidelines: A complete evaluation includes B-mode imaging, spectral Doppler, color Doppler, and power Doppler as needed of all accessible portions of each vessel. Bilateral testing is considered an integral part of a complete examination. Limited examinations for reoccurring indications may be performed as noted.  Right Findings: +----------+------------+---------+-----------+----------+-------+ RIGHT     CompressiblePhasicitySpontaneousPropertiesSummary +----------+------------+---------+-----------+----------+-------+ IJV           Full  Yes       Yes                      +----------+------------+---------+-----------+----------+-------+ Subclavian    Full       Yes       Yes                      +----------+------------+---------+-----------+----------+-------+ Axillary      Full       Yes       Yes                      +----------+------------+---------+-----------+----------+-------+ Brachial      Full       Yes       Yes                      +----------+------------+---------+-----------+----------+-------+ Radial        Full       Yes       Yes                      +----------+------------+---------+-----------+----------+-------+ Ulnar         Full       Yes       Yes                      +----------+------------+---------+-----------+----------+-------+ Cephalic      Full                 Yes                      +----------+------------+---------+-----------+----------+-------+ Basilic       Full                 Yes                      +----------+------------+---------+-----------+----------+-------+  Left Findings: +----------+------------+---------+-----------+----------+-------+ LEFT       CompressiblePhasicitySpontaneousPropertiesSummary +----------+------------+---------+-----------+----------+-------+ Subclavian    Full       Yes       Yes                      +----------+------------+---------+-----------+----------+-------+  Summary:  Right: No evidence of deep vein thrombosis in the upper extremity. No evidence of superficial vein thrombosis in the upper extremity.  Left: No evidence of thrombosis in the subclavian.  *See table(s) above for measurements and observations.  Diagnosing physician: Coral Else MD Electronically signed by Coral Else MD on 11/04/2023 at 7:21:59 PM.    Final    CT Angio Chest/Abd/Pel for Dissection W and/or Wo Contrast Result Date: 11/03/2023 CLINICAL DATA:  Abdominal pain, tachycardia, pulsatile abdominal mass history of lung cancer * Tracking Code: BO * EXAM: CT ANGIOGRAPHY CHEST, ABDOMEN AND PELVIS TECHNIQUE: Non-contrast CT of the chest was initially obtained. Multidetector CT imaging through the chest, abdomen and pelvis was performed using the standard protocol during bolus administration of intravenous contrast. Multiplanar reconstructed images and MIPs were obtained and reviewed to evaluate the vascular anatomy. RADIATION DOSE REDUCTION: This exam was performed according to the departmental dose-optimization program which includes automated exposure control, adjustment of the mA and/or kV according to patient size and/or use of iterative reconstruction technique. CONTRAST:  OMNIPAQUE IOHEXOL 350 MG/ML SOLN COMPARISON:  CT chest, 09/19/2023, CT abdomen pelvis, 10/13/2023 FINDINGS: CTA CHEST FINDINGS VASCULAR Aorta: Satisfactory opacification  of the aorta. Normal contour and caliber of the thoracic aorta. No evidence of aneurysm, dissection, or other acute aortic pathology. Moderate aortic atherosclerosis Cardiovascular: Right chest port catheter. No evidence of pulmonary embolism on limited non-tailored examination. Normal heart size.  Enlargement of the main pulmonary artery measuring up to 3.9 cm in caliber. No pericardial effusion. Review of the MIP images confirms the above findings. NON VASCULAR Mediastinum/Nodes: No enlarged mediastinal, hilar, or axillary lymph nodes. Thyroid gland, trachea, and esophagus demonstrate no significant findings. Lungs/Pleura: Small right pleural effusion, similar to prior examination. Perhaps slightly diminished size of a cavitary mass of the right upper lobe measuring 4.9 x 3.2 cm, previously 4.8 x 3.6 cm, with somewhat increased cavitation (series 12, image 34). New, dense nodular consolidation in the anterior left upper lobe measuring 2.4 x 1.8 cm (series 12, image 55). Diffuse bilateral bronchial wall thickening. Bandlike scarring of the bilateral lung bases. Musculoskeletal: No chest wall abnormality. No acute osseous findings. Review of the MIP images confirms the above findings. CTA ABDOMEN AND PELVIS FINDINGS VASCULAR Normal contour and caliber of the abdominal aorta. No evidence of aneurysm, dissection, or other acute aortic pathology. Standard branching pattern of the abdominal aorta with solitary bilateral renal arteries. Moderate aortic atherosclerosis. Review of the MIP images confirms the above findings. NON-VASCULAR Hepatobiliary: No significant change in hypodense lesions of the posterior liver, largest measuring 1.9 x 1.5 cm (series 22, image 163). No gallstones, gallbladder wall thickening, or biliary dilatation. Pancreas: Unremarkable. No pancreatic ductal dilatation or surrounding inflammatory changes. Spleen: Normal in size without significant abnormality. Adrenals/Urinary Tract: Adrenal glands are unremarkable. Simple, benign renal cortical cysts, for which no further follow-up or characterization is required. Kidneys are otherwise normal, without renal calculi, solid lesion, or hydronephrosis. Bladder is unremarkable. Stomach/Bowel: Stomach is within normal limits. Appendix not clearly  visualized and may be surgically absent. No evidence of bowel wall thickening, distention, or inflammatory changes. Lymphatic: No enlarged abdominal or pelvic lymph nodes. Reproductive: Status post hysterectomy Other: No abdominal wall hernia or abnormality. No ascites. Musculoskeletal: No acute osseous findings. IMPRESSION: 1. Normal contour and caliber of the thoracic and abdominal aorta. No evidence of aneurysm, dissection, or other acute aortic pathology. Moderate aortic atherosclerosis. 2. Perhaps slightly diminished size of a cavitary mass of the right upper lobe with somewhat increased cavitation. Findings are consistent with known primary lung malignancy. 3. New, dense nodular consolidation in the anterior left upper lobe measuring 2.4 x 1.8 cm. Findings are most consistent with infection rapid interval development. 4. Small right pleural effusion, similar to prior examination. 5. No significant change in hypodense lesions of the posterior liver, largest measuring 1.9 x 1.5 cm. These remain suspicious for metastases. 6. Enlargement of the main pulmonary artery, as can be seen in pulmonary hypertension. Aortic Atherosclerosis (ICD10-I70.0). Electronically Signed   By: Jearld Lesch M.D.   On: 11/03/2023 09:28   DG Chest Portable 1 View Result Date: 11/03/2023 CLINICAL DATA:  Sepsis. Abdominal pain for 3 days. History of non-small cell lung cancer. EXAM: PORTABLE CHEST 1 VIEW COMPARISON:  10/12/2023 FINDINGS: Right chest wall porta catheter is noted with tip in the distal SVC. Heart size and mediastinal contours are stable. Unchanged small right pleural effusion. Right apical cavitary lung mass with surrounding pleuroparenchymal scarring is unchanged. Left lung is clear. No acute abnormality. IMPRESSION: 1. No acute findings. 2. Persistent small right pleural effusion. 3. Unchanged right apical cavitary lung mass with surrounding pleuroparenchymal scarring. Electronically Signed   By: Ladona Ridgel  Bradly Chris M.D.    On: 11/03/2023 08:16   DG Lumbar Spine Complete Result Date: 10/20/2023 CLINICAL DATA:  Fall, low back pain EXAM: LUMBAR SPINE - COMPLETE 4+ VIEW COMPARISON:  02/18/2020, CT 10/13/2023 FINDINGS: Normal alignment. No fracture. Early degenerative changes in the lower lumbar spine with slight disc space narrowing and early spurring. Vascular calcifications. No visible aneurysm. IMPRESSION: No acute bony abnormality. Electronically Signed   By: Charlett Nose M.D.   On: 10/20/2023 03:34   CT Cervical Spine Wo Contrast Result Date: 10/20/2023 CLINICAL DATA:  Polytrauma, blunt.  Fall. EXAM: CT CERVICAL SPINE WITHOUT CONTRAST TECHNIQUE: Multidetector CT imaging of the cervical spine was performed without intravenous contrast. Multiplanar CT image reconstructions were also generated. RADIATION DOSE REDUCTION: This exam was performed according to the departmental dose-optimization program which includes automated exposure control, adjustment of the mA and/or kV according to patient size and/or use of iterative reconstruction technique. COMPARISON:  None Available. FINDINGS: Alignment: Normal Skull base and vertebrae: No acute fracture. No primary bone lesion or focal pathologic process. Soft tissues and spinal canal: No prevertebral fluid or swelling. No visible canal hematoma. Disc levels: Mild degenerative disc disease in the lower cervical spine. No disc herniation. Upper chest: Again partially visualized is the right upper lobe mass and layering large right pleural effusion. Other: None IMPRESSION: No acute bony abnormality. Known right apical mass and large layering right pleural effusion. Electronically Signed   By: Charlett Nose M.D.   On: 10/20/2023 02:08   CT Head Wo Contrast Result Date: 10/20/2023 CLINICAL DATA:  Polytrauma, blunt.  Fall. EXAM: CT HEAD WITHOUT CONTRAST TECHNIQUE: Contiguous axial images were obtained from the base of the skull through the vertex without intravenous contrast. RADIATION DOSE  REDUCTION: This exam was performed according to the departmental dose-optimization program which includes automated exposure control, adjustment of the mA and/or kV according to patient size and/or use of iterative reconstruction technique. COMPARISON:  None Available. FINDINGS: Brain: Again noted are areas of edema within the left temporal lobe and left occipital lobe in areas of prior known metastases. No areas of hemorrhage. No hydrocephalus. No midline shift., Improved since prior study Vascular: No hyperdense vessel or unexpected calcification. Skull: No acute calvarial abnormality. 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 No acute findings. Electronically Signed   By: Charlett Nose M.D.   On: 10/20/2023 02:05   DG HIP UNILAT WITH PELVIS 2-3 VIEWS RIGHT Result Date: 10/18/2023 CLINICAL DATA:  Fall EXAM: DG HIP (WITH OR WITHOUT PELVIS) 2-3V RIGHT COMPARISON:  CT 10/13/2023 FINDINGS: No fracture or malalignment.  Moderate hip arthritis. IMPRESSION: No acute osseous abnormality. Electronically Signed   By: Jasmine Pang M.D.   On: 10/18/2023 23:45   DG HIP UNILAT WITH PELVIS 2-3 VIEWS LEFT Result Date: 10/18/2023 CLINICAL DATA:  Fall EXAM: DG HIP (WITH OR WITHOUT PELVIS) 2-3V LEFT COMPARISON:  CT 10/13/2023 FINDINGS: SI joints are non widened. Pubic symphysis and rami are intact. No acute fracture or malalignment. IMPRESSION: No acute osseous abnormality Electronically Signed   By: Jasmine Pang M.D.   On: 10/18/2023 23:44   US BIOPSY (LIVER) Result Date: 10/18/2023 INDICATION: 62 year old female with a history liver lesion, possible tumor versus abscess EXAM: ULTRASOUND-GUIDED LIVER LESION BIOPSY MEDICATIONS: None. ANESTHESIA/SEDATION: Moderate (conscious) sedation was employed during this procedure. A total of Versed 1.5 mg and Fentanyl 50 mcg was administered intravenously by the radiology  nurse. Total  intra-service moderate Sedation Time: 18 minutes. The patient's level of consciousness and vital signs were monitored continuously by radiology nursing throughout the procedure under my direct supervision. COMPLICATIONS: NONE PROCEDURE: Informed written consent was obtained from the patient after a thorough discussion of the procedural risks, benefits and alternatives. All questions were addressed. Maximal Sterile Barrier Technique was utilized including caps, mask, sterile gowns, sterile gloves, sterile drape, hand hygiene and skin antiseptic. A timeout was performed prior to the initiation of the procedure. Ultrasound survey of the right liver lobe performed with images stored and sent to PACs. The lesion in segment 6 is hypoechoic and quite small. The only way to visualize the lesion for a possible biopsy was from a medial to lateral approach from the anterior. The right lower thorax/right upper abdomen was prepped with chlorhexidine in a sterile fashion, and a sterile drape was applied covering the operative field. A sterile gown and sterile gloves were used for the procedure. Local anesthesia was provided with 1% Lidocaine. The patient was prepped and draped sterilely and the skin and subcutaneous tissues were generously infiltrated with 1% lidocaine. A 17 gauge introducer needle was then advanced under ultrasound guidance in a subcostal location into the right liver lobe. The stylet was removed, and aspiration was attempted. No aspirate returned. Multiple separate 18 gauge core biopsy were then retrieved. Samples were placed into formalin for transportation to the lab. The needle was removed, and a final ultrasound image was performed. The patient tolerated the procedure well and remained hemodynamically stable throughout. No complications were encountered and no significant blood loss was encounter. IMPRESSION: Status post ultrasound-guided biopsy of right liver lesion. Aspiration was attempted,  with no aspirate achieved. Signed, Yvone Neu. Miachel Roux, RPVI [facility] Electronically Signed   By: Gilmer Mor D.O.   On: 10/18/2023 11:23   MR ABDOMEN W WO CONTRAST Result Date: 10/14/2023 CLINICAL DATA:  Liver lesion EXAM: MRI ABDOMEN WITHOUT AND WITH CONTRAST TECHNIQUE: Multiplanar multisequence MR imaging of the abdomen was performed both before and after the administration of intravenous contrast. CONTRAST:  7mL GADAVIST GADOBUTROL 1 MMOL/ML IV SOLN COMPARISON:  CT 10/13/2023.  Older CT 02/19/2019. FINDINGS: Lower chest: Tiny left and small right pleural effusion. Heart enlarged is some linear signal changes in the lung bases. Atelectasis or scarring. Hepatobiliary: No biliary ductal dilatation. Gallbladder is nondilated. Patent portal vein. Complex lesion seen posteriorly in the right hepatic lobe segment 6/7 is again identified today. This lesion has a fluid-fluid level measures 16 by 12 mm. There is some marginal enhancement of the lesion as well as some mild adjacent edema. Again differential would include an abscess. Smaller lesion identified just anterior and superior to this lesion is dark on precontrast T1, bright on T2 and also has some marginal enhancement measuring 12 mm. The lesion seen in segment 7 more superiorly is less visible today but again there is significant breathing motion Pancreas: No mass, inflammatory changes, or other parenchymal abnormality identified. Spleen: Small splenic lesion again identified posteriorly which is dark on T1, moderately bright on T2. Assessment of enhancement is difficult with motion. Again lesion measures 8 mm. Adrenals/Urinary Tract: Adrenal glands are preserved. Benign bilateral renal cystic foci are identified. Stomach/Bowel: Visualized bowel is nondilated. The stranding along the course of the mid descending colon is again noted. Vascular/Lymphatic: Mild atherosclerotic changes along the  aorta. Normal caliber aorta and IVC. No discrete abnormal lymph node enlargement in the visualized abdomen. Other:  Evaluation  significantly limited by motion. Musculoskeletal: Degenerative changes along the spine. IMPRESSION: Three liver lesions identified. Once again the dominant focus posteriorly in the right hepatic lobe has a rim enhancement as well as abnormal diffusion. This has concerning for abscess over a malignant lesion. The 2 other smaller lesions are more indeterminate and would recommend close follow-up. Indeterminate small splenic lesion.  Attention on follow-up. Small right and tiny left pleural effusion. Evaluation significantly limited by motion. Electronically Signed   By: Karen Kays M.D.   On: 10/14/2023 17:14   CT ABDOMEN PELVIS W CONTRAST Result Date: 10/13/2023 CLINICAL DATA:  Lung cancer.  Sepsis. EXAM: CT ABDOMEN AND PELVIS WITH CONTRAST TECHNIQUE: Multidetector CT imaging of the abdomen and pelvis was performed using the standard protocol following bolus administration of intravenous contrast. RADIATION DOSE REDUCTION: This exam was performed according to the departmental dose-optimization program which includes automated exposure control, adjustment of the mA and/or kV according to patient size and/or use of iterative reconstruction technique. CONTRAST:  75mL OMNIPAQUE IOHEXOL 300 MG/ML  SOLN COMPARISON:  PET-CT 08/03/2023. standard CT April 2020 FINDINGS: Lower chest: Bronchiectasis seen along the lung bases. Breathing motion. Dependent areas of atelectasis and scarring. Trace pleural fluid on the right. Right-sided fat containing diaphragmatic hernia again seen. Hepatobiliary: Nodular contours of the liver. Cystic lesion posterior along the right hepatic lobe on series 3, image 21 has a capsule and some associated parenchymal edema surrounding lesion measuring 19 x 16 mm. Small focus just above this well but more simple appearing measuring 10 mm and a vague low-density area in  segment 7 measuring 7 mm on series 3, image 10. These are not clearly seen on the previous examinations. Patent portal vein. Gallbladder is mildly distended. Pancreas: Unremarkable. No pancreatic ductal dilatation or surrounding inflammatory changes. Spleen: Spleen is nonenlarged. There is a cystic area posterior along the spleen which was not seen previously measuring 8 mm. Not clearly a simple cyst Adrenals/Urinary Tract: Adrenal glands are preserved. Benign bilateral renal cystic foci identified, Bosniak 1 and 2 lesions. No enhancing mass or collecting system dilatation. The ureters have normal course and caliber extending down to the urinary bladder. Preserved contours of the urinary bladder. Stomach/Bowel: Stomach is decompressed. Small bowel is nondilated. The large bowel has a normal course and caliber. Left-sided areas of wall thickening identified with diverticula and stranding. Please correlate for a colitis. Mild changes as well along the sigmoid and rectum. Fluid along the lumen of the right side of the colon normal appendix seen in the right hemipelvis. Posteromedial to the cecum. Vascular/Lymphatic: Aortic atherosclerosis. No enlarged abdominal or pelvic lymph nodes. Reproductive: Status post hysterectomy. No adnexal masses. Other: Anasarca. Musculoskeletal: Degenerative changes seen of the spine and pelvis particularly of the right hip. IMPRESSION: New complex cystic areas in the right hepatic lobe. Based on appearance this has worrisome for potential abscess formation but there is a differential. Please correlate with clinical presentation. If needed MRI evaluation could be considered. Largest measures 19 mm in the right hepatic lobe. Small cystic area in the spleen is mildly complex and more indeterminate. This also could be assessed on follow-up. Wall thickening with stranding along the descending colon greater than the rectosigmoid colon. Please correlate for a colitis. No obstruction. Recommend  follow-up. Tiny right pleural effusion. Electronically Signed   By: Karen Kays M.D.   On: 10/13/2023 14:21   DG Chest Port 1 View Result Date: 10/12/2023 CLINICAL DATA:  Possible sepsis. EXAM: PORTABLE CHEST 1 VIEW COMPARISON:  09/17/2023 and older exams.  CT, 09/19/2023. FINDINGS: Cardiac silhouette is normal in size. No convincing mediastinal or hilar masses. Right anterior chest wall Port-A-Cath is stable. Right-sided volume loss with mediastinal shift, stable. There is an ill-defined masslike opacity in the right upper lobe, unchanged. Stable interstitial type opacities right mid lung and both lung bases. No new lung abnormalities. No convincing pleural effusion.  No pneumothorax. Skeletal structures are grossly intact. Surgical vascular clips on the right reflect prior breast surgery, also stable IMPRESSION: 1. No significant change from the recent prior chest radiograph. 2. 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 3. Right-sided volume loss with mediastinal shift. 4. No new abnormalities. Electronically Signed   By: Amie Portland M.D.   On: 10/12/2023 08:45

## 2023-11-08 NOTE — Progress Notes (Signed)
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  She was started on broad-spectrum antibiotics.  Oncology was consulted.  Assessment & Plan:   Sepsis: Present on admission Probable Healthcare associated bacterial pneumonia: Organism unknown -Currently on cefepime and vancomycin.  Finish 7-day course of therapy.  Blood cultures are negative so far. -Hemodynamically improving.  COPD Chronic respiratory failure with hypoxia -Respiratory status remained stable at baseline 2 L oxygen via nasal cannula.  No evidence of COPD exacerbation -Continue current inhaled regimen  Stage IV non-small cell lung cancer Pancytopenia -Follows up with oncology/neuro-oncology and radiation oncology as an outpatient.  Continue home dose Decadron.  Currently on outpatient palliative chemotherapy overall prognosis is guarded to poor. -Received 2 units of packed red cells transfusion since hospitalization.  Hemoglobin 8.3 today.  WBCs improving.  She is on Granix currently as per oncology recommendations.  Has received 2 units of platelets so far.  Platelets 15 today.  Transfuse 1 more unit.  Monitor.  Hypomagnesemia -Replace.  Repeat a.m. labs.  Hypokalemia -Replace.  Repeat a.m. labs  Hypertension Hyperlipidemia -Continue current regimen  Depression--continue sertraline  Physical deconditioning -PT recommended home health PT  DVT prophylaxis: SCDs Code Status: DNR Family Communication: None at bedside Disposition Plan: Status is: Inpatient Remains inpatient  appropriate because: Of severity of illness  Consultants: oncology  Procedures: None  Antimicrobials:  Anti-infectives (From admission, onward)    Start     Dose/Rate Route Frequency Ordered Stop   11/04/23 1100  Vancomycin (VANCOCIN) 1,500 mg in sodium chloride 0.9 % 500 mL IVPB        1,500 mg 250 mL/hr over 120 Minutes Intravenous Every 24 hours 11/03/23 1031     11/03/23 1200  ceFEPIme (MAXIPIME) 2 g in sodium chloride 0.9 % 100 mL IVPB        2 g 200 mL/hr over 30 Minutes Intravenous Every 8 hours 11/03/23 1031     11/03/23 1030  Vancomycin (VANCOCIN) 1,500 mg in sodium chloride 0.9 % 500 mL IVPB        1,500 mg 250 mL/hr over 120 Minutes Intravenous  Once 11/03/23 1027 11/03/23 1301   11/03/23 0330  ceFEPIme (MAXIPIME) 2 g in sodium chloride 0.9 % 100 mL IVPB        2 g 200 mL/hr over 30 Minutes Intravenous  Once 11/03/23 0318 11/03/23 0428   11/03/23 0330  metroNIDAZOLE (FLAGYL) IVPB 500 mg        500 mg 100 mL/hr over 60 Minutes Intravenous  Once 11/03/23 0318 11/03/23 0542        Subjective: Patient seen and examined at bedside.  Continues to feel weak.  No fever, chest pain or shortness of breath reported.   Objective: Vitals:   11/08/23 0500 11/08/23 0508 11/08/23 0523 11/08/23 0638  BP:  (!) 166/85 (!) 154/88 (!) 149/77  Pulse:  73 71 78  Resp:  16 18 18   Temp:  98.3 F (36.8 C) 98.2 F (36.8 C) 98.2 F (36.8 C)  TempSrc:  Oral Oral Oral  SpO2:  Weight: 74 kg     Height:        Intake/Output Summary (Last 24 hours) at 11/08/2023 0751 Last data filed at 11/08/2023 0523 Gross per 24 hour  Intake 920 ml  Output 400 ml  Net 520 ml   Filed Weights   11/03/23 1318 11/07/23 0455 11/08/23 0500  Weight: 68.2 kg 73.6 kg 74 kg    Examination:  General: On 2 L oxygen via nasal cannula.  No distress.  Chronically ill and deconditioned looking. ENT/neck: No obvious JVD elevation or palpable thyromegaly noted  respiratory: Decreased breath sounds at  bases bilaterally with some crackles  CVS: S1-S2 heard; rate currently controlled Abdominal: Soft, nontender, remains slightly distended; no organomegaly, normal bowel sounds heard  extremities: Trace lower extremity edema present; no cyanosis CNS: Still slow to respond.  Awake.  No obvious focal deficits noted  lymph: No lymphadenopathy palpable  skin: No obvious lesions/ecchymosis  psych: Showing no signs of agitation.  Flat affect.   Musculoskeletal: No obvious joint erythema/tenderness   Data Reviewed: I have personally reviewed following labs and imaging studies  CBC: Recent Labs  Lab 11/03/23 0313 11/03/23 0447 11/05/23 0212 11/05/23 2006 11/06/23 0121 11/07/23 0322 11/07/23 2334 11/08/23 0253  WBC 1.2*   < > 0.4*  --  0.5* 1.1* 1.7* 1.9*  NEUTROABS 0.9*  --  0.3*  --  0.3* 0.4*  --  1.0*  HGB 8.9*   < > 7.0* 8.7* 8.6* 8.4* 8.5* 8.3*  HCT 29.0*   < > 21.5* 26.2* 26.4* 24.8* 25.5* 25.2*  MCV 90.3   < > 87.8  --  86.6 86.4 87.0 86.3  PLT 65*   < > 31*  --  18* 24* 17* 15*   < > = values in this interval not displayed.   Basic Metabolic Panel: Recent Labs  Lab 11/03/23 0313 11/03/23 0447 11/04/23 0410 11/05/23 0212 11/06/23 0121 11/07/23 0322 11/08/23 0253  NA 141 138 136  --  137 135 137  K 4.0 4.0 3.8  --  3.3* 3.3* 3.0*  CL 97* 95* 98  --  100 99 100  CO2 35*  --  29  --  27 32 29  GLUCOSE 76 73 108*  --  101* 119* 119*  BUN 14 12 15   --  13 11 13   CREATININE 0.80 0.80 0.65  --  0.71 0.72 0.63  CALCIUM 8.5*  --  8.2*  --  8.0* 8.2* 8.5*  MG  --   --   --  1.3* 1.8 1.4* 1.2*  PHOS  --   --   --  2.6  --   --   --    GFR: Estimated Creatinine Clearance: 72.5 mL/min (by C-G formula based on SCr of 0.63 mg/dL). Liver Function Tests: Recent Labs  Lab 11/03/23 0313 11/06/23 0121  AST 20 12*  ALT 15 12  ALKPHOS 60 48  BILITOT 0.8 0.8  PROT 5.8* 5.4*  ALBUMIN 3.0* 2.5*   Recent Labs  Lab 11/03/23 0313  LIPASE 22   No results for input(s):  "AMMONIA" in the last 168 hours. Coagulation Profile: Recent Labs  Lab 11/03/23 0501  INR 1.3*   Cardiac Enzymes: No results for input(s): "CKTOTAL", "CKMB", "CKMBINDEX", "TROPONINI" in the last 168 hours. BNP (last 3 results) No results for input(s): "PROBNP" in the last 8760 hours. HbA1C: No results for input(s): "HGBA1C" in the last 72 hours. CBG: No results for input(s): "GLUCAP" in the last 168 hours.  Lipid Profile: No results for input(s): "CHOL", "HDL", "LDLCALC", "TRIG", "CHOLHDL", "LDLDIRECT" in the last 72 hours. Thyroid Function Tests: No results for input(s): "TSH", "T4TOTAL", "FREET4", "T3FREE", "THYROIDAB" in the last 72 hours. Anemia Panel: No results for input(s): "VITAMINB12", "FOLATE", "FERRITIN", "TIBC", "IRON", "RETICCTPCT" in the last 72 hours. Sepsis Labs: Recent Labs  Lab 11/03/23 0452 11/03/23 0807  LATICACIDVEN 1.7 1.6    Recent Results (from the past 240 hours)  Resp panel by RT-PCR (RSV, Flu A&B, Covid) Anterior Nasal Swab     Status: None   Collection Time: 11/03/23  4:14 AM   Specimen: Anterior Nasal Swab  Result Value Ref Range Status   SARS Coronavirus 2 by RT PCR NEGATIVE NEGATIVE Final    Comment: (NOTE) SARS-CoV-2 target nucleic acids are NOT DETECTED.  The SARS-CoV-2 RNA is generally detectable in upper respiratory specimens during the acute phase of infection. The lowest concentration of SARS-CoV-2 viral copies this assay can detect is 138 copies/mL. A negative result does not preclude SARS-Cov-2 infection and should not be used as the sole basis for treatment or other patient management decisions. A negative result may occur with  improper specimen collection/handling, submission of specimen other than nasopharyngeal swab, presence of viral mutation(s) within the areas targeted by this assay, and inadequate number of viral copies(<138 copies/mL). A negative result must be combined with clinical observations, patient history, and  epidemiological information. The expected result is Negative.  Fact Sheet for Patients:  BloggerCourse.com  Fact Sheet for Healthcare Providers:  SeriousBroker.it  This test is no t yet approved or cleared by the [place] FDA and  has been authorized for detection and/or diagnosis of SARS-CoV-2 by FDA under an Emergency Use Authorization (EUA). This EUA will remain  in effect (meaning this test can be used) for the duration of the COVID-19 declaration under Section 564(b)(1) of the Act, 21 U.S.C.section 360bbb-3(b)(1), unless the authorization is terminated  or revoked sooner.       Influenza A by PCR NEGATIVE NEGATIVE Final   Influenza B by PCR NEGATIVE NEGATIVE Final    Comment: (NOTE) The Xpert Xpress SARS-CoV-2/FLU/RSV plus assay is intended as an aid in the diagnosis of influenza from Nasopharyngeal swab specimens and should not be used as a sole basis for treatment. Nasal washings and aspirates are unacceptable for Xpert Xpress SARS-CoV-2/FLU/RSV testing.  Fact Sheet for Patients: BloggerCourse.com  Fact Sheet for Healthcare Providers: SeriousBroker.it  This test is not yet approved or cleared by the [place] FDA and has been authorized for detection and/or diagnosis of SARS-CoV-2 by FDA under an Emergency Use Authorization (EUA). This EUA will remain in effect (meaning this test can be used) for the duration of the COVID-19 declaration under Section 564(b)(1) of the Act, 21 U.S.C. section 360bbb-3(b)(1), unless the authorization is terminated or revoked.     Resp Syncytial Virus by PCR NEGATIVE NEGATIVE Final    Comment: (NOTE) Fact Sheet for Patients: BloggerCourse.com  Fact Sheet for Healthcare Providers: SeriousBroker.it  This test is not yet approved or cleared by the [place] FDA and has been  authorized for detection and/or diagnosis of SARS-CoV-2 by FDA under an Emergency Use Authorization (EUA). This EUA will remain in effect (meaning this test can be used) for the duration of the COVID-19 declaration under Section 564(b)(1) of the Act, 21 U.S.C. section 360bbb-3(b)(1), unless the authorization is terminated or revoked.  Performed at [facility], [address], [place], [place] [address]   Blood Culture (routine  x 2)     Status: None   Collection Time: 11/03/23  4:57 AM   Specimen: BLOOD  Result Value Ref Range Status   Specimen Description   Final    BLOOD SITE NOT SPECIFIED Performed at [facility], [address], [place], [place] [address]    Special Requests   Final    BOTTLES DRAWN AEROBIC AND ANAEROBIC Blood Culture results may not be optimal due to an inadequate volume of blood received in culture bottles Performed at [facility], [address], [place], [place] [address]    Culture   Final    NO GROWTH 5 DAYS Performed at [facility], [address], [place], [place] [address]    Report Status 11/08/2023 FINAL  Final  Blood Culture (routine x 2)     Status: None   Collection Time: 11/03/23  5:01 AM   Specimen: BLOOD LEFT HAND  Result Value Ref Range Status   Specimen Description   Final    BLOOD LEFT HAND Performed at [facility], [address] [place], [place] [address]    Special Requests   Final    BOTTLES DRAWN AEROBIC AND ANAEROBIC Blood Culture results may not be optimal due to an inadequate volume of blood received in culture bottles Performed at [facility], [address] [place], [place] [address]    Culture   Final    NO GROWTH 5 DAYS Performed at [facility], [address], [place], [place] [address]    Report Status 11/08/2023 FINAL  Final  MRSA Next Gen by PCR, Nasal     Status: Abnormal   Collection Time: 11/04/23 10:48 AM    Specimen: Nasal Mucosa; Nasal Swab  Result Value Ref Range Status   MRSA by PCR Next Gen DETECTED (A) NOT DETECTED Final    Comment: CRITICAL RESULT CALLED TO, READ BACK BY AND VERIFIED WITH: Manuella Ghazi RN @ 1150 ON 11/04/23 CAL (NOTE) The GeneXpert MRSA Assay (FDA approved for NASAL specimens only), is one component of a comprehensive MRSA colonization surveillance program. It is not intended to diagnose MRSA infection nor to guide or monitor treatment for MRSA infections. Test performance is not FDA approved in patients less than 61 years old. Performed at [facility], [address], [place], [place] [address]          Radiology Studies: No results found.       Scheduled Meds:  sodium chloride   Intravenous Once   sodium chloride   Intravenous Once   sodium chloride   Intravenous Once   arformoterol  15 mcg Nebulization BID   And   umeclidinium bromide  1 puff Inhalation Daily   aspirin  81 mg Oral Daily   atorvastatin  20 mg Oral Daily   Chlorhexidine Gluconate Cloth  6 each Topical Daily   Chlorhexidine Gluconate Cloth  6 each Topical Q0600   dexamethasone  4 mg Oral BID   filgrastim  300 mcg Subcutaneous Daily   levETIRAcetam  750 mg Oral BID   magnesium oxide  400 mg Oral Q0600   metoprolol tartrate  25 mg Oral Daily   montelukast  10 mg Oral QHS   mupirocin ointment  1 Application Nasal BID   pantoprazole  40 mg Oral Daily   sertraline  50 mg Oral Daily   triamterene-hydrochlorothiazide  1 tablet Oral Daily   Continuous Infusions:  ceFEPime (MAXIPIME)  IV 2 g (11/08/23 0333)   vancomycin Stopped (11/07/23 1238)          Glade Lloyd, MD [facility] 11/08/2023, 7:51 AM

## 2023-11-08 NOTE — Progress Notes (Signed)
    Patient Name: [name]           DOB: 02-Nov-1961  MRN: [id number]      Admission Date: 11/03/2023  Attending Provider: Glade Lloyd, MD  Primary Diagnosis: Sepsis due to pneumonia [facility])   Level of care: Med-Surg    CROSS COVER NOTE   Date of Service   11/08/2023   [name], 62 y.o. female, was admitted on 11/03/2023 for Sepsis due to pneumonia [facility]).    HPI/Events of Note   Bedside RN reports patient had short episode of epistaxis. Patient has a history of pancytopenia, likely multifactorial due to recent chemo and immunotherapy, malignancy, chronic disease.   Has recent platelet level of 24 after receiving 1 unit platelets on 11/06/2023.  Per oncology recommendation, transfuse platelet if < 20K.   Addendum: Platelet -17K    Interventions/ Plan   Platelet transfusion, 1 unit Recheck CBC, transfuse as needed if platelet < 20K        Anthoney Harada, DNP, ACNPC- AG [facility]

## 2023-11-08 NOTE — Plan of Care (Signed)

## 2023-11-08 NOTE — Progress Notes (Signed)
Physical Therapy Treatment Patient Details Name: [name] MRN: [id number] DOB: [birth year] Today's Date: 11/08/2023   History of Present Illness 62 yo female admitted with Pna, sepsis, pancytopenia. Hx of stage IV  NSCLC with mets, COPD-O2 dep, sarcoidosis, neutropenic fever, diarrhea, breast Ca    PT Comments  Pt sitting EOB on arrival and requesting bed change.  Linen soaked with urine.  Pt assisted with donning new clean gown and transferred OOB to recliner.  NT notified.  Pt declined further activity at this time.     If plan is discharge home, recommend the following: A little help with walking and/or transfers;Direct supervision/assist for medications management;Direct supervision/assist for financial management;Assist for transportation;Help with stairs or ramp for entrance;Supervision due to cognitive status   Can travel by private vehicle        Equipment Recommendations  None recommended by PT    Recommendations for Other Services       Precautions / Restrictions Precautions Precautions: Fall Precaution Comments: 2L O2 baseline Restrictions Weight Bearing Restrictions Per Provider Order: No     Mobility  Bed Mobility               General bed mobility comments: sitting EOB on arrival    Transfers Overall transfer level: Needs assistance Equipment used: None Transfers: Sit to/from Stand Sit to Stand: Min assist, Contact guard assist   Step pivot transfers: Contact guard assist       General transfer comment: light assist to rise and stabilize, declined walker but utilized armrest of recliner, transfer OOB to recliner; pt declined further activity    Ambulation/Gait                   Stairs             Wheelchair Mobility     Tilt Bed    Modified Rankin (Stroke Patients Only)       Balance Overall balance assessment: Needs assistance Sitting-balance support: No upper extremity supported, Feet supported Sitting  balance-Leahy Scale: Good     Standing balance support: No upper extremity supported, During functional activity Standing balance-Leahy Scale: Fair                              Cognition Arousal: Alert Behavior During Therapy: Flat affect Overall Cognitive Status: No family/caregiver present to determine baseline cognitive functioning                               Problem Solving: Slow processing, Requires verbal cues General Comments: appears slow to process        Exercises      General Comments        Pertinent Vitals/Pain Pain Assessment Pain Assessment: No/denies pain    Home Living                          Prior Function            PT Goals (current goals can now be found in the care plan section) Progress towards PT goals: Progressing toward goals    Frequency    Min 1X/week      PT Plan      Co-evaluation              AM-PAC PT "6 Clicks" Mobility   Outcome Measure  Help needed turning  from your back to your side while in a flat bed without using bedrails?: None Help needed moving from lying on your back to sitting on the side of a flat bed without using bedrails?: A Little Help needed moving to and from a bed to a chair (including a wheelchair)?: A Little Help needed standing up from a chair using your arms (e.g., wheelchair or bedside chair)?: A Little Help needed to walk in hospital room?: A Little Help needed climbing 3-5 steps with a railing? : A Lot 6 Click Score: 18    End of Session Equipment Utilized During Treatment: Oxygen Activity Tolerance: Patient tolerated treatment well Patient left: in chair;with call bell/phone within reach (pt agreeable to use call bell, NT aware that pt not on alarm)   PT Visit Diagnosis: Muscle weakness (generalized) (M62.81);Difficulty in walking, not elsewhere classified (R26.2)     Time: 1349-1410 PT Time Calculation (min) (ACUTE ONLY): 21 min  Charges:     $Therapeutic Activity: 8-22 mins PT General Charges $$ ACUTE PT VISIT: 1 Visit                    Paulino Door, DPT Physical Therapist Acute Rehabilitation Services Office: [phone]    [name] 11/08/2023, 4:03 PM

## 2023-11-08 NOTE — Progress Notes (Signed)
PT Cancellation Note  Patient Details Name: [name] MRN: [id number] DOB: 05/12/1961   Cancelled Treatment:    Reason Eval/Treat Not Completed: Patient declined, no reason specified  Does not like her breakfast.  Pt looking over menu to order new food.   [name] 11/08/2023, 10:42 AM Paulino Door, DPT Physical Therapist Acute Rehabilitation Services Office: [phone]

## 2023-11-09 ENCOUNTER — Ambulatory Visit (HOSPITAL_COMMUNITY): Payer: 59

## 2023-11-09 DIAGNOSIS — J189 Pneumonia, unspecified organism: Secondary | ICD-10-CM | POA: Diagnosis not present

## 2023-11-09 DIAGNOSIS — A419 Sepsis, unspecified organism: Secondary | ICD-10-CM | POA: Diagnosis not present

## 2023-11-09 LAB — CBC WITH DIFFERENTIAL/PLATELET
Abs Immature Granulocytes: 1.34 10*3/uL — ABNORMAL HIGH (ref 0.00–0.07)
Basophils Absolute: 0.1 10*3/uL (ref 0.0–0.1)
Basophils Relative: 2 %
Eosinophils Absolute: 0 10*3/uL (ref 0.0–0.5)
Eosinophils Relative: 0 %
HCT: 27.6 % — ABNORMAL LOW (ref 36.0–46.0)
Hemoglobin: 9.1 g/dL — ABNORMAL LOW (ref 12.0–15.0)
Immature Granulocytes: 26 %
Lymphocytes Relative: 7 %
Lymphs Abs: 0.4 10*3/uL — ABNORMAL LOW (ref 0.7–4.0)
MCH: 28.4 pg (ref 26.0–34.0)
MCHC: 33 g/dL (ref 30.0–36.0)
MCV: 86.3 fL (ref 80.0–100.0)
Monocytes Absolute: 0.8 10*3/uL (ref 0.1–1.0)
Monocytes Relative: 15 %
Neutro Abs: 2.6 10*3/uL (ref 1.7–7.7)
Neutrophils Relative %: 50 %
Platelets: 42 10*3/uL — ABNORMAL LOW (ref 150–400)
RBC: 3.2 MIL/uL — ABNORMAL LOW (ref 3.87–5.11)
RDW: 14.6 % (ref 11.5–15.5)
WBC: 5.1 10*3/uL (ref 4.0–10.5)
nRBC: 0.6 % — ABNORMAL HIGH (ref 0.0–0.2)

## 2023-11-09 LAB — BPAM PLATELET PHERESIS
Blood Product Expiration Date: 202412282359
Blood Product Expiration Date: 202412292359
ISSUE DATE / TIME: 202412260458
ISSUE DATE / TIME: 202412262016
Unit Type and Rh: 600
Unit Type and Rh: 6200

## 2023-11-09 LAB — BASIC METABOLIC PANEL
Anion gap: 11 (ref 5–15)
BUN: 12 mg/dL (ref 8–23)
CO2: 28 mmol/L (ref 22–32)
Calcium: 8.8 mg/dL — ABNORMAL LOW (ref 8.9–10.3)
Chloride: 99 mmol/L (ref 98–111)
Creatinine, Ser: 0.71 mg/dL (ref 0.44–1.00)
GFR, Estimated: >60 mL/min (ref >60)
Glucose, Bld: 99 mg/dL (ref 70–99)
Potassium: 3.6 mmol/L (ref 3.5–5.1)
Sodium: 138 mmol/L (ref 135–145)

## 2023-11-09 LAB — PREPARE PLATELET PHERESIS
Unit division: 0
Unit division: 0

## 2023-11-09 LAB — MAGNESIUM: Magnesium: 1.7 mg/dL (ref 1.7–2.4)

## 2023-11-09 MED ORDER — HEPARIN SOD (PORK) LOCK FLUSH 100 UNIT/ML IV SOLN
500.0000 [IU] | INTRAVENOUS | Status: AC | PRN
  Administered 2023-11-09: 500 [IU]

## 2023-11-09 MED ORDER — POTASSIUM CHLORIDE CRYS ER 20 MEQ PO TBCR: 20.0000 meq | EXTENDED_RELEASE_TABLET | Freq: Every day | ORAL | 0 refills | Status: DC

## 2023-11-09 NOTE — TOC Transition Note (Signed)
Transition of Care [facility]) - Discharge Note   Patient Details  Name: [name] MRN: [id number] Date of Birth: 04-Oct-1961  [facility]) CM/SW Contact:  Larrie Kass, LCSW Phone Number: 11/09/2023, 12:07 PM   Clinical Narrative:     Pt to d/c home, CSW spoke with pt's daughter [name], she reports she will bring portable O2 tank and pick pt up upon d/c . Pt to d/c with [facility] home health PT, OT, RN. No further TOC need TOC sign off.   Final next level of care: Home w Home Health Services Barriers to Discharge: No Barriers Identified   Patient Goals and CMS Choice Patient states their goals for this hospitalization and ongoing recovery are:: retrun home with home health          Discharge Placement                  Name of family member notified: vonceil, sena (Daughter) [phone] Patient and family notified of of transfer: 11/09/23  Discharge Plan and Services Additional resources added to the After Visit Summary for                                       Social Drivers of Health (SDOH) Interventions SDOH Screenings   Food Insecurity: No Food Insecurity (11/03/2023)  Housing: Unknown (11/03/2023)  Transportation Needs: No Transportation Needs (11/03/2023)  Utilities: Not At Risk (11/03/2023)  Alcohol Screen: Low Risk  (07/23/2023)  Depression (PHQ2-9): Low Risk  (07/23/2023)  Tobacco Use: Medium Risk (11/03/2023)     Readmission Risk Interventions    10/18/2023   12:35 PM 09/18/2023    1:52 PM  Readmission Risk Prevention Plan  Transportation Screening Complete Complete  Medication Review [facility]) Complete Referral to Pharmacy  PCP or Specialist appointment within 3-5 days of discharge Complete Complete  HRI or Home Care Consult Complete Complete  SW Recovery Care/Counseling Consult Complete Complete  Palliative Care Screening Complete Complete  Skilled Nursing Facility Not Applicable Not Applicable

## 2023-11-09 NOTE — Plan of Care (Signed)

## 2023-11-09 NOTE — Discharge Summary (Signed)
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  She was started on broad-spectrum antibiotics.  Oncology was consulted.  During the hospitalization, her condition has improved.  She received 2 units of packed red cells and 3 units of platelets during the hospitalization.  She has also been treated with Granix as per oncology.  Cultures have remained negative so far.  She has received 7 days of IV antibiotics.  Her hemoglobin and platelets are currently stable and neutropenia has resolved.  She will be discharged home today with outpatient follow-up with PCP and oncology.  Discharge Diagnoses:   Sepsis: Present on admission: Resolved Probable Healthcare associated bacterial pneumonia: Organism unknown -Treated with 7 days of cefepime and vancomycin.  Blood cultures are negative so far. -Hemodynamically improving. -No need for further antibiotics.  Discharge patient home today.   COPD Chronic respiratory failure with hypoxia -Respiratory status remained stable at baseline 2 L oxygen via nasal cannula.  No evidence of COPD exacerbation -Continue  home regimen.  Stage IV non-small cell lung cancer Pancytopenia -Follows up with oncology/neuro-oncology and radiation oncology as an outpatient.  Continue home dose Decadron.  Currently on outpatient palliative chemotherapy overall prognosis is guarded to poor. -Received 2 units of packed red cells transfusion since hospitalization.  Hemoglobin 9.1 today.  WBCs improved and neutrophils have become normal.  She received IV Granix as per oncology during the hospitalization.  Has received 3 units of platelets so far.  Platelets 42 today.  Oncology has cleared the patient for discharge.  Outpatient follow-up with oncology.   Hypomagnesemia -Improved.  Hypokalemia -Improved  Hypertension Hyperlipidemia -Continue current regimen   Depression--continue sertraline   Physical deconditioning -PT recommended home health PT  Discharge Instructions  Discharge Instructions     Diet - low sodium heart healthy   Complete by: As directed    Increase activity slowly   Complete by: As directed    No wound care   Complete by: As directed       Allergies as of 11/09/2023       Reactions   Codeine Other (See Comments)   Avoids-felt bad when taken before Pt claims she can have this but does not like to take        Medication List     STOP taking these medications    Magnesium 400 MG Caps       TAKE these medications    acetaminophen 325 MG tablet Commonly known as: TYLENOL Take 650 mg by mouth every 6 (six) hours as needed for mild pain or moderate pain.   albuterol 108 (90 Base) MCG/ACT inhaler Commonly known as: VENTOLIN HFA INHALE 2 PUFFS EVERY 6 HOURS AS NEEDED FOR WHEEZING OR SHORTNESS OF BREATH  aspirin 81 MG chewable tablet Chew 1 tablet (81 mg total) by mouth daily.   atorvastatin 20 MG tablet Commonly known as: LIPITOR TAKE 1 TABLET (20 MG TOTAL) BY MOUTH DAILY.   dexamethasone 4 MG tablet Commonly known as: DECADRON Take 1 tablet (4 mg total) by mouth 2  (two) times daily.   fluticasone 50 MCG/ACT nasal spray Commonly known as: FLONASE USE 2 SPRAYS IN BOTH  NOSTRILS DAILY AS NEEDED  FOR ALLERGIES   HYDROcodone-acetaminophen 5-325 MG tablet Commonly known as: Norco Take 1 tablet by mouth every 6 (six) hours as needed for moderate pain (pain score 4-6).   levETIRAcetam 750 MG tablet Commonly known as: KEPPRA Take 1 tablet (750 mg total) by mouth 2 (two) times daily.   lidocaine-prilocaine cream Commonly known as: EMLA Apply to affected area once   MAGnesium-Oxide 400 (240 Mg) MG tablet Generic drug: magnesium oxide Take 1 tablet by mouth every morning.   metoprolol tartrate 25 MG tablet Commonly known as: LOPRESSOR Take 1 tablet (25 mg total) by mouth daily.   montelukast 10 MG tablet Commonly known as: SINGULAIR Take 10 mg by mouth at bedtime.   multivitamin capsule Take 1 capsule by mouth daily with breakfast.   omeprazole 20 MG capsule Commonly known as: PRILOSEC TAKE 1 CAPSULE EVERY DAY What changed: when to take this   ondansetron 4 MG tablet Commonly known as: ZOFRAN Take 1 tablet (4 mg total) by mouth every 6 (six) hours as needed for nausea.   OXYGEN Inhale 2-3 L/min into the lungs See admin instructions. Inhale 2-3 L/min into the lungs CONTINUOUSLY; 2 L/min at rest and 3 L/min if exerted   potassium chloride SA 20 MEQ tablet Commonly known as: KLOR-CON M Take 1 tablet (20 mEq total) by mouth daily for 14 days.   prochlorperazine 10 MG tablet Commonly known as: COMPAZINE Take 1 tablet (10 mg total) by mouth every 6 (six) hours as needed for nausea or vomiting.   sertraline 50 MG tablet Commonly known as: ZOLOFT TAKE 1 TABLET EVERY DAY   Stiolto Respimat 2.5-2.5 MCG/ACT Aers Generic drug: Tiotropium Bromide-Olodaterol USE 2 INHALATIONS BY MOUTH DAILY   triamterene-hydrochlorothiazide 37.5-25 MG tablet Commonly known as: MAXZIDE-25 Take 1 tablet by mouth daily.        Follow-up Information      [name]., FNP. Schedule an appointment as soon as possible for a visit in 1 week(s).   Specialty: Family Medicine Why: With repeat CBC/BMP Contact information: [address] [place] [address] [phone]         Si Gaul, MD. Schedule an appointment as soon as possible for a visit in 1 week(s).   Specialty: Oncology Contact information: [address] [place] [address] [phone]                Allergies  Allergen Reactions   Codeine Other (See Comments)    Avoids-felt bad when taken before Pt claims she can have this but does not like to take    Consultations: Oncology   Procedures/Studies: VAS [place] UPPER EXTREMITY VENOUS DUPLEX Result Date: 11/04/2023 UPPER VENOUS STUDY  Patient Name:  [name]  Date of Exam:   11/04/2023 Medical Rec #: [id number]       Accession #:    [id number] Date of Birth: May 31, 1961        Patient Gender: F Patient Age:   5 years Exam Location:  [facility] Procedure:  VAS [place] UPPER EXTREMITY VENOUS DUPLEX Referring Phys: PARDEEP KHATRI --------------------------------------------------------------------------------  Indications: Swelling, and Pain Risk Factors: Cancer Breast past pregnancy. Comparison Study: None Performing Technologist: Shona Simpson  Examination Guidelines: A complete evaluation includes B-mode imaging, spectral Doppler, color Doppler, and power Doppler as needed of all accessible portions of each vessel. Bilateral testing is considered an integral part of a complete examination. Limited examinations for reoccurring indications may be performed as noted.  Right Findings: +----------+------------+---------+-----------+----------+-------+ RIGHT     CompressiblePhasicitySpontaneousPropertiesSummary +----------+------------+---------+-----------+----------+-------+ IJV           Full       Yes       Yes                       +----------+------------+---------+-----------+----------+-------+ Subclavian    Full       Yes       Yes                      +----------+------------+---------+-----------+----------+-------+ Axillary      Full       Yes       Yes                      +----------+------------+---------+-----------+----------+-------+ Brachial      Full       Yes       Yes                      +----------+------------+---------+-----------+----------+-------+ Radial        Full       Yes       Yes                      +----------+------------+---------+-----------+----------+-------+ Ulnar         Full       Yes       Yes                      +----------+------------+---------+-----------+----------+-------+ Cephalic      Full                 Yes                      +----------+------------+---------+-----------+----------+-------+ Basilic       Full                 Yes                      +----------+------------+---------+-----------+----------+-------+  Left Findings: +----------+------------+---------+-----------+----------+-------+ LEFT      CompressiblePhasicitySpontaneousPropertiesSummary +----------+------------+---------+-----------+----------+-------+ Subclavian    Full       Yes       Yes                      +----------+------------+---------+-----------+----------+-------+  Summary:  Right: No evidence of deep vein thrombosis in the upper extremity. No evidence of superficial vein thrombosis in the upper extremity.  Left: No evidence of thrombosis in the subclavian.  *See table(s) above for measurements and observations.  Diagnosing physician: Coral Else MD Electronically signed by Coral Else MD on 11/04/2023 at 7:21:59 PM.    Final    CT Angio Chest/Abd/Pel for Dissection W and/or Wo Contrast Result Date: 11/03/2023 CLINICAL DATA:  Abdominal pain, tachycardia, pulsatile abdominal mass history of lung cancer * Tracking Code: BO * EXAM: CT  ANGIOGRAPHY CHEST, ABDOMEN  AND PELVIS TECHNIQUE: Non-contrast CT of the chest was initially obtained. Multidetector CT imaging through the chest, abdomen and pelvis was performed using the standard protocol during bolus administration of intravenous contrast. Multiplanar reconstructed images and MIPs were obtained and reviewed to evaluate the vascular anatomy. RADIATION DOSE REDUCTION: This exam was performed according to the departmental dose-optimization program which includes automated exposure control, adjustment of the mA and/or kV according to patient size and/or use of iterative reconstruction technique. CONTRAST:  OMNIPAQUE IOHEXOL 350 MG/ML SOLN COMPARISON:  CT chest, 09/19/2023, CT abdomen pelvis, 10/13/2023 FINDINGS: CTA CHEST FINDINGS VASCULAR Aorta: Satisfactory opacification of the aorta. Normal contour and caliber of the thoracic aorta. No evidence of aneurysm, dissection, or other acute aortic pathology. Moderate aortic atherosclerosis Cardiovascular: Right chest port catheter. No evidence of pulmonary embolism on limited non-tailored examination. Normal heart size. Enlargement of the main pulmonary artery measuring up to 3.9 cm in caliber. No pericardial effusion. Review of the MIP images confirms the above findings. NON VASCULAR Mediastinum/Nodes: No enlarged mediastinal, hilar, or axillary lymph nodes. Thyroid gland, trachea, and esophagus demonstrate no significant findings. Lungs/Pleura: Small right pleural effusion, similar to prior examination. Perhaps slightly diminished size of a cavitary mass of the right upper lobe measuring 4.9 x 3.2 cm, previously 4.8 x 3.6 cm, with somewhat increased cavitation (series 12, image 34). New, dense nodular consolidation in the anterior left upper lobe measuring 2.4 x 1.8 cm (series 12, image 55). Diffuse bilateral bronchial wall thickening. Bandlike scarring of the bilateral lung bases. Musculoskeletal: No chest wall abnormality. No acute osseous  findings. Review of the MIP images confirms the above findings. CTA ABDOMEN AND PELVIS FINDINGS VASCULAR Normal contour and caliber of the abdominal aorta. No evidence of aneurysm, dissection, or other acute aortic pathology. Standard branching pattern of the abdominal aorta with solitary bilateral renal arteries. Moderate aortic atherosclerosis. Review of the MIP images confirms the above findings. NON-VASCULAR Hepatobiliary: No significant change in hypodense lesions of the posterior liver, largest measuring 1.9 x 1.5 cm (series 22, image 163). No gallstones, gallbladder wall thickening, or biliary dilatation. Pancreas: Unremarkable. No pancreatic ductal dilatation or surrounding inflammatory changes. Spleen: Normal in size without significant abnormality. Adrenals/Urinary Tract: Adrenal glands are unremarkable. Simple, benign renal cortical cysts, for which no further follow-up or characterization is required. Kidneys are otherwise normal, without renal calculi, solid lesion, or hydronephrosis. Bladder is unremarkable. Stomach/Bowel: Stomach is within normal limits. Appendix not clearly visualized and may be surgically absent. No evidence of bowel wall thickening, distention, or inflammatory changes. Lymphatic: No enlarged abdominal or pelvic lymph nodes. Reproductive: Status post hysterectomy Other: No abdominal wall hernia or abnormality. No ascites. Musculoskeletal: No acute osseous findings. IMPRESSION: 1. Normal contour and caliber of the thoracic and abdominal aorta. No evidence of aneurysm, dissection, or other acute aortic pathology. Moderate aortic atherosclerosis. 2. Perhaps slightly diminished size of a cavitary mass of the right upper lobe with somewhat increased cavitation. Findings are consistent with known primary lung malignancy. 3. New, dense nodular consolidation in the anterior left upper lobe measuring 2.4 x 1.8 cm. Findings are most consistent with infection rapid interval development. 4.  Small right pleural effusion, similar to prior examination. 5. No significant change in hypodense lesions of the posterior liver, largest measuring 1.9 x 1.5 cm. These remain suspicious for metastases. 6. Enlargement of the main pulmonary artery, as can be seen in pulmonary hypertension. Aortic Atherosclerosis (ICD10-I70.0). Electronically Signed   By: Jearld Lesch M.D.   On: 11/03/2023 09:28  DG Chest Portable 1 View Result Date: 11/03/2023 CLINICAL DATA:  Sepsis. Abdominal pain for 3 days. History of non-small cell lung cancer. EXAM: PORTABLE CHEST 1 VIEW COMPARISON:  10/12/2023 FINDINGS: Right chest wall porta catheter is noted with tip in the distal SVC. Heart size and mediastinal contours are stable. Unchanged small right pleural effusion. Right apical cavitary lung mass with surrounding pleuroparenchymal scarring is unchanged. Left lung is clear. No acute abnormality. IMPRESSION: 1. No acute findings. 2. Persistent small right pleural effusion. 3. Unchanged right apical cavitary lung mass with surrounding pleuroparenchymal scarring. Electronically Signed   By: Signa Kell M.D.   On: 11/03/2023 08:16   DG Lumbar Spine Complete Result Date: 10/20/2023 CLINICAL DATA:  Fall, low back pain EXAM: LUMBAR SPINE - COMPLETE 4+ VIEW COMPARISON:  02/18/2020, CT 10/13/2023 FINDINGS: Normal alignment. No fracture. Early degenerative changes in the lower lumbar spine with slight disc space narrowing and early spurring. Vascular calcifications. No visible aneurysm. IMPRESSION: No acute bony abnormality. Electronically Signed   By: Charlett Nose M.D.   On: 10/20/2023 03:34   CT Cervical Spine Wo Contrast Result Date: 10/20/2023 CLINICAL DATA:  Polytrauma, blunt.  Fall. EXAM: CT CERVICAL SPINE WITHOUT CONTRAST TECHNIQUE: Multidetector CT imaging of the cervical spine was performed without intravenous contrast. Multiplanar CT image reconstructions were also generated. RADIATION DOSE REDUCTION: This exam was  performed according to the departmental dose-optimization program which includes automated exposure control, adjustment of the mA and/or kV according to patient size and/or use of iterative reconstruction technique. COMPARISON:  None Available. FINDINGS: Alignment: Normal Skull base and vertebrae: No acute fracture. No primary bone lesion or focal pathologic process. Soft tissues and spinal canal: No prevertebral fluid or swelling. No visible canal hematoma. Disc levels: Mild degenerative disc disease in the lower cervical spine. No disc herniation. Upper chest: Again partially visualized is the right upper lobe mass and layering large right pleural effusion. Other: None IMPRESSION: No acute bony abnormality. Known right apical mass and large layering right pleural effusion. Electronically Signed   By: Charlett Nose M.D.   On: 10/20/2023 02:08   CT Head Wo Contrast Result Date: 10/20/2023 CLINICAL DATA:  Polytrauma, blunt.  Fall. EXAM: CT HEAD WITHOUT CONTRAST TECHNIQUE: Contiguous axial images were obtained from the base of the skull through the vertex without intravenous contrast. RADIATION DOSE REDUCTION: This exam was performed according to the departmental dose-optimization program which includes automated exposure control, adjustment of the mA and/or kV according to patient size and/or use of iterative reconstruction technique. COMPARISON:  None Available. FINDINGS: Brain: Again noted are areas of edema within the left temporal lobe and left occipital lobe in areas of prior known metastases. No areas of hemorrhage. No hydrocephalus. No midline shift., Improved since prior study Vascular: No hyperdense vessel or unexpected calcification. Skull: No acute calvarial abnormality. 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 No acute findings. Electronically Signed   By: Charlett Nose M.D.   On: 10/20/2023  02:05   DG HIP UNILAT WITH PELVIS 2-3 VIEWS RIGHT Result Date: 10/18/2023 CLINICAL DATA:  Fall EXAM: DG HIP (WITH OR WITHOUT PELVIS) 2-3V RIGHT COMPARISON:  CT 10/13/2023 FINDINGS: No fracture or malalignment.  Moderate hip arthritis. IMPRESSION: No acute osseous abnormality. Electronically Signed   By: Jasmine Pang M.D.   On: 10/18/2023 23:45   DG HIP UNILAT WITH PELVIS 2-3 VIEWS LEFT Result Date: 10/18/2023 CLINICAL DATA:  Fall  EXAM: DG HIP (WITH OR WITHOUT PELVIS) 2-3V LEFT COMPARISON:  CT 10/13/2023 FINDINGS: SI joints are non widened. Pubic symphysis and rami are intact. No acute fracture or malalignment. IMPRESSION: No acute osseous abnormality Electronically Signed   By: Jasmine Pang M.D.   On: 10/18/2023 23:44   US BIOPSY (LIVER) Result Date: 10/18/2023 INDICATION: 62 year old female with a history liver lesion, possible tumor versus abscess EXAM: ULTRASOUND-GUIDED LIVER LESION BIOPSY MEDICATIONS: None. ANESTHESIA/SEDATION: Moderate (conscious) sedation was employed during this procedure. A total of Versed 1.5 mg and Fentanyl 50 mcg was administered intravenously by the radiology nurse. Total intra-service moderate Sedation Time: 18 minutes. The patient's level of consciousness and vital signs were monitored continuously by radiology nursing throughout the procedure under my direct supervision. COMPLICATIONS: NONE PROCEDURE: Informed written consent was obtained from the patient after a thorough discussion of the procedural risks, benefits and alternatives. All questions were addressed. Maximal Sterile Barrier Technique was utilized including caps, mask, sterile gowns, sterile gloves, sterile drape, hand hygiene and skin antiseptic. A timeout was performed prior to the initiation of the procedure. Ultrasound survey of the right liver lobe performed with images stored and sent to PACs. The lesion in segment 6 is hypoechoic and quite small. The only way to visualize the lesion for a possible biopsy  was from a medial to lateral approach from the anterior. The right lower thorax/right upper abdomen was prepped with chlorhexidine in a sterile fashion, and a sterile drape was applied covering the operative field. A sterile gown and sterile gloves were used for the procedure. Local anesthesia was provided with 1% Lidocaine. The patient was prepped and draped sterilely and the skin and subcutaneous tissues were generously infiltrated with 1% lidocaine. A 17 gauge introducer needle was then advanced under ultrasound guidance in a subcostal location into the right liver lobe. The stylet was removed, and aspiration was attempted. No aspirate returned. Multiple separate 18 gauge core biopsy were then retrieved. Samples were placed into formalin for transportation to the lab. The needle was removed, and a final ultrasound image was performed. The patient tolerated the procedure well and remained hemodynamically stable throughout. No complications were encountered and no significant blood loss was encounter. IMPRESSION: Status post ultrasound-guided biopsy of right liver lesion. Aspiration was attempted, with no aspirate achieved. Signed, Yvone Neu. Miachel Roux, RPVI [facility] Electronically Signed   By: Gilmer Mor D.O.   On: 10/18/2023 11:23   MR ABDOMEN W WO CONTRAST Result Date: 10/14/2023 CLINICAL DATA:  Liver lesion EXAM: MRI ABDOMEN WITHOUT AND WITH CONTRAST TECHNIQUE: Multiplanar multisequence MR imaging of the abdomen was performed both before and after the administration of intravenous contrast. CONTRAST:  7mL GADAVIST GADOBUTROL 1 MMOL/ML IV SOLN COMPARISON:  CT 10/13/2023.  Older CT 02/19/2019. FINDINGS: Lower chest: Tiny left and small right pleural effusion. Heart enlarged is some linear signal changes in the lung bases. Atelectasis or scarring. Hepatobiliary: No biliary ductal dilatation. Gallbladder is nondilated. Patent portal vein.  Complex lesion seen posteriorly in the right hepatic lobe segment 6/7 is again identified today. This lesion has a fluid-fluid level measures 16 by 12 mm. There is some marginal enhancement of the lesion as well as some mild adjacent edema. Again differential would include an abscess. Smaller lesion identified just anterior and superior to this lesion is dark on precontrast T1, bright on T2 and also has some marginal enhancement measuring 12 mm. The lesion seen in segment 7 more superiorly is less  visible today but again there is significant breathing motion Pancreas: No mass, inflammatory changes, or other parenchymal abnormality identified. Spleen: Small splenic lesion again identified posteriorly which is dark on T1, moderately bright on T2. Assessment of enhancement is difficult with motion. Again lesion measures 8 mm. Adrenals/Urinary Tract: Adrenal glands are preserved. Benign bilateral renal cystic foci are identified. Stomach/Bowel: Visualized bowel is nondilated. The stranding along the course of the mid descending colon is again noted. Vascular/Lymphatic: Mild atherosclerotic changes along the aorta. Normal caliber aorta and IVC. No discrete abnormal lymph node enlargement in the visualized abdomen. Other:  Evaluation significantly limited by motion. Musculoskeletal: Degenerative changes along the spine. IMPRESSION: Three liver lesions identified. Once again the dominant focus posteriorly in the right hepatic lobe has a rim enhancement as well as abnormal diffusion. This has concerning for abscess over a malignant lesion. The 2 other smaller lesions are more indeterminate and would recommend close follow-up. Indeterminate small splenic lesion.  Attention on follow-up. Small right and tiny left pleural effusion. Evaluation significantly limited by motion. Electronically Signed   By: Karen Kays M.D.   On: 10/14/2023 17:14   CT ABDOMEN PELVIS W CONTRAST Result Date: 10/13/2023 CLINICAL DATA:  Lung  cancer.  Sepsis. EXAM: CT ABDOMEN AND PELVIS WITH CONTRAST TECHNIQUE: Multidetector CT imaging of the abdomen and pelvis was performed using the standard protocol following bolus administration of intravenous contrast. RADIATION DOSE REDUCTION: This exam was performed according to the departmental dose-optimization program which includes automated exposure control, adjustment of the mA and/or kV according to patient size and/or use of iterative reconstruction technique. CONTRAST:  75mL OMNIPAQUE IOHEXOL 300 MG/ML  SOLN COMPARISON:  PET-CT 08/03/2023. standard CT April 2020 FINDINGS: Lower chest: Bronchiectasis seen along the lung bases. Breathing motion. Dependent areas of atelectasis and scarring. Trace pleural fluid on the right. Right-sided fat containing diaphragmatic hernia again seen. Hepatobiliary: Nodular contours of the liver. Cystic lesion posterior along the right hepatic lobe on series 3, image 21 has a capsule and some associated parenchymal edema surrounding lesion measuring 19 x 16 mm. Small focus just above this well but more simple appearing measuring 10 mm and a vague low-density area in segment 7 measuring 7 mm on series 3, image 10. These are not clearly seen on the previous examinations. Patent portal vein. Gallbladder is mildly distended. Pancreas: Unremarkable. No pancreatic ductal dilatation or surrounding inflammatory changes. Spleen: Spleen is nonenlarged. There is a cystic area posterior along the spleen which was not seen previously measuring 8 mm. Not clearly a simple cyst Adrenals/Urinary Tract: Adrenal glands are preserved. Benign bilateral renal cystic foci identified, Bosniak 1 and 2 lesions. No enhancing mass or collecting system dilatation. The ureters have normal course and caliber extending down to the urinary bladder. Preserved contours of the urinary bladder. Stomach/Bowel: Stomach is decompressed. Small bowel is nondilated. The large bowel has a normal course and caliber.  Left-sided areas of wall thickening identified with diverticula and stranding. Please correlate for a colitis. Mild changes as well along the sigmoid and rectum. Fluid along the lumen of the right side of the colon normal appendix seen in the right hemipelvis. Posteromedial to the cecum. Vascular/Lymphatic: Aortic atherosclerosis. No enlarged abdominal or pelvic lymph nodes. Reproductive: Status post hysterectomy. No adnexal masses. Other: Anasarca. Musculoskeletal: Degenerative changes seen of the spine and pelvis particularly of the right hip. IMPRESSION: New complex cystic areas in the right hepatic lobe. Based on appearance this has worrisome for potential abscess formation but there is a  differential. Please correlate with clinical presentation. If needed MRI evaluation could be considered. Largest measures 19 mm in the right hepatic lobe. Small cystic area in the spleen is mildly complex and more indeterminate. This also could be assessed on follow-up. Wall thickening with stranding along the descending colon greater than the rectosigmoid colon. Please correlate for a colitis. No obstruction. Recommend follow-up. Tiny right pleural effusion. Electronically Signed   By: Karen Kays M.D.   On: 10/13/2023 14:21   DG Chest Port 1 View Result Date: 10/12/2023 CLINICAL DATA:  Possible sepsis. EXAM: PORTABLE CHEST 1 VIEW COMPARISON:  09/17/2023 and older exams.  CT, 09/19/2023. FINDINGS: Cardiac silhouette is normal in size. No convincing mediastinal or hilar masses. Right anterior chest wall Port-A-Cath is stable. Right-sided volume loss with mediastinal shift, stable. There is an ill-defined masslike opacity in the right upper lobe, unchanged. Stable interstitial type opacities right mid lung and both lung bases. No new lung abnormalities. No convincing pleural effusion.  No pneumothorax. Skeletal structures are grossly intact. Surgical vascular clips on the right reflect prior breast surgery, also stable  IMPRESSION: 1. No significant change from the recent prior chest radiograph. 2. 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 3. Right-sided volume loss with mediastinal shift. 4. No new abnormalities. Electronically Signed   By: Amie Portland M.D.   On: 10/12/2023 08:45      Subjective: Patient seen and examined at bedside.  Feels okay to go home today.  No fever, vomiting, chest pain or shortness of breath reported.  Discharge Exam: Vitals:   11/09/23 0818 11/09/23 0938  BP:  137/69  Pulse:  94  Resp:  20  Temp:  98.2 F (36.8 C)  SpO2: 100% 100%    General: Pt is alert, awake, not in acute distress.  Slow to respond.  On 2 L oxygen by nasal cannula.  Looks chronically ill and deconditioned. Cardiovascular: rate controlled, S1/S2 + Respiratory: bilateral decreased breath sounds at bases with scattered crackles Abdominal: Soft, NT, ND, bowel sounds + Extremities: Trace lower extremity edema; no cyanosis    The results of significant diagnostics from this hospitalization (including imaging, microbiology, ancillary and laboratory) are listed below for reference.     Microbiology: Recent Results (from the past 240 hours)  Resp panel by RT-PCR (RSV, Flu A&B, Covid) Anterior Nasal Swab     Status: None   Collection Time: 11/03/23  4:14 AM   Specimen: Anterior Nasal Swab  Result Value Ref Range Status   SARS Coronavirus 2 by RT PCR NEGATIVE NEGATIVE Final    Comment: (NOTE) SARS-CoV-2 target nucleic acids are NOT DETECTED.  The SARS-CoV-2 RNA is generally detectable in upper respiratory specimens during the acute phase of infection. The lowest concentration of SARS-CoV-2 viral copies this assay can detect is 138 copies/mL. A negative result does not preclude SARS-Cov-2 infection and should not be used as the sole basis for treatment or other patient management decisions. A negative result may occur with  improper specimen collection/handling, submission  of specimen other than nasopharyngeal swab, presence of viral mutation(s) within the areas targeted by this assay, and inadequate number of viral copies(<138 copies/mL). A negative result must be combined with clinical observations, patient history, and epidemiological information. The expected result is Negative.  Fact Sheet for Patients:  BloggerCourse.com  Fact Sheet for Healthcare Providers:  SeriousBroker.it  This test is no t yet approved or cleared by the [place] FDA and  has been authorized for detection and/or  diagnosis of SARS-CoV-2 by FDA under an Emergency Use Authorization (EUA). This EUA will remain  in effect (meaning this test can be used) for the duration of the COVID-19 declaration under Section 564(b)(1) of the Act, 21 U.S.C.section 360bbb-3(b)(1), unless the authorization is terminated  or revoked sooner.       Influenza A by PCR NEGATIVE NEGATIVE Final   Influenza B by PCR NEGATIVE NEGATIVE Final    Comment: (NOTE) The Xpert Xpress SARS-CoV-2/FLU/RSV plus assay is intended as an aid in the diagnosis of influenza from Nasopharyngeal swab specimens and should not be used as a sole basis for treatment. Nasal washings and aspirates are unacceptable for Xpert Xpress SARS-CoV-2/FLU/RSV testing.  Fact Sheet for Patients: BloggerCourse.com  Fact Sheet for Healthcare Providers: SeriousBroker.it  This test is not yet approved or cleared by the [place] FDA and has been authorized for detection and/or diagnosis of SARS-CoV-2 by FDA under an Emergency Use Authorization (EUA). This EUA will remain in effect (meaning this test can be used) for the duration of the COVID-19 declaration under Section 564(b)(1) of the Act, 21 U.S.C. section 360bbb-3(b)(1), unless the authorization is terminated or revoked.     Resp Syncytial Virus by PCR NEGATIVE NEGATIVE  Final    Comment: (NOTE) Fact Sheet for Patients: BloggerCourse.com  Fact Sheet for Healthcare Providers: SeriousBroker.it  This test is not yet approved or cleared by the [place] FDA and has been authorized for detection and/or diagnosis of SARS-CoV-2 by FDA under an Emergency Use Authorization (EUA). This EUA will remain in effect (meaning this test can be used) for the duration of the COVID-19 declaration under Section 564(b)(1) of the Act, 21 U.S.C. section 360bbb-3(b)(1), unless the authorization is terminated or revoked.  Performed at [facility], [address] [place], [place] [address]   Blood Culture (routine x 2)     Status: None   Collection Time: 11/03/23  4:57 AM   Specimen: BLOOD  Result Value Ref Range Status   Specimen Description   Final    BLOOD SITE NOT SPECIFIED Performed at [facility], [address], [place], [place] [address]    Special Requests   Final    BOTTLES DRAWN AEROBIC AND ANAEROBIC Blood Culture results may not be optimal due to an inadequate volume of blood received in culture bottles Performed at [facility], [address], [place], [place] [address]    Culture   Final    NO GROWTH 5 DAYS Performed at [facility], [address], [place], [place] [address]    Report Status 11/08/2023 FINAL  Final  Blood Culture (routine x 2)     Status: None   Collection Time: 11/03/23  5:01 AM   Specimen: BLOOD LEFT HAND  Result Value Ref Range Status   Specimen Description   Final    BLOOD LEFT HAND Performed at [facility], [address], [place], [place] [address]    Special Requests   Final    BOTTLES DRAWN AEROBIC AND ANAEROBIC Blood Culture results may not be optimal due to an inadequate volume of blood received in culture bottles Performed at [facility], [address], [place], [place]  [address]    Culture   Final    NO GROWTH 5 DAYS Performed at [facility], [address], [place], [place] [address]    Report Status 11/08/2023 FINAL  Final  MRSA Next Gen by PCR,  Nasal     Status: Abnormal   Collection Time: 11/04/23 10:48 AM   Specimen: Nasal Mucosa; Nasal Swab  Result Value Ref Range Status   MRSA by PCR Next Gen DETECTED (A) NOT DETECTED Final    Comment: CRITICAL RESULT CALLED TO, READ BACK BY AND VERIFIED WITH: Manuella Ghazi RN @ 1150 ON 11/04/23 CAL (NOTE) The GeneXpert MRSA Assay (FDA approved for NASAL specimens only), is one component of a comprehensive MRSA colonization surveillance program. It is not intended to diagnose MRSA infection nor to guide or monitor treatment for MRSA infections. Test performance is not FDA approved in patients less than 62 years old. Performed at [facility], [address], [place], [place] [address]      Labs: BNP (last 3 results) No results for input(s): "BNP" in the last 8760 hours. Basic Metabolic Panel: Recent Labs  Lab 11/04/23 0410 11/05/23 0212 11/06/23 0121 11/07/23 0322 11/08/23 0253 11/09/23 0340  NA 136  --  137 135 137 138  K 3.8  --  3.3* 3.3* 3.0* 3.6  CL 98  --  100 99 100 99  CO2 29  --  27 32 29 28  GLUCOSE 108*  --  101* 119* 119* 99  BUN 15  --  13 11 13 12   CREATININE 0.65  --  0.71 0.72 0.63 0.71  CALCIUM 8.2*  --  8.0* 8.2* 8.5* 8.8*  MG  --  1.3* 1.8 1.4* 1.2* 1.7  PHOS  --  2.6  --   --   --   --    Liver Function Tests: Recent Labs  Lab 11/03/23 0313 11/06/23 0121  AST 20 12*  ALT 15 12  ALKPHOS 60 48  BILITOT 0.8 0.8  PROT 5.8* 5.4*  ALBUMIN 3.0* 2.5*   Recent Labs  Lab 11/03/23 0313  LIPASE 22   No results for input(s): "AMMONIA" in the last 168 hours. CBC: Recent Labs  Lab 11/05/23 0212 11/05/23 2006 11/06/23 0121 11/07/23 0322 11/07/23 2334 11/08/23 0253 11/08/23 1054 11/09/23 0340  WBC 0.4*  --  0.5* 1.1* 1.7* 1.9*  --  5.1   NEUTROABS 0.3*  --  0.3* 0.4*  --  1.0*  --  2.6  HGB 7.0*   < > 8.6* 8.4* 8.5* 8.3* 8.0* 9.1*  HCT 21.5*   < > 26.4* 24.8* 25.5* 25.2* 23.6* 27.6*  MCV 87.8  --  86.6 86.4 87.0 86.3  --  86.3  PLT 31*  --  18* 24* 17* 15*  --  42*   < > = values in this interval not displayed.   Cardiac Enzymes: No results for input(s): "CKTOTAL", "CKMB", "CKMBINDEX", "TROPONINI" in the last 168 hours. BNP: Invalid input(s): "POCBNP" CBG: No results for input(s): "GLUCAP" in the last 168 hours. D-Dimer No results for input(s): "DDIMER" in the last 72 hours. Hgb A1c No results for input(s): "HGBA1C" in the last 72 hours. Lipid Profile No results for input(s): "CHOL", "HDL", "LDLCALC", "TRIG", "CHOLHDL", "LDLDIRECT" in the last 72 hours. Thyroid function studies No results for input(s): "TSH", "T4TOTAL", "T3FREE", "THYROIDAB" in the last 72 hours.  Invalid input(s): "FREET3" Anemia work up No results for input(s): "VITAMINB12", "FOLATE", "FERRITIN", "TIBC", "IRON", "RETICCTPCT" in the last 72 hours. Urinalysis    Component Value Date/Time   COLORURINE YELLOW 11/03/2023 0601   APPEARANCEUR CLEAR 11/03/2023 0601   LABSPEC 1.010 11/03/2023 0601   PHURINE 7.0 11/03/2023 0601   GLUCOSEU NEGATIVE 11/03/2023 0601   HGBUR SMALL (  A) 11/03/2023 0601   HGBUR small 05/13/2010 1505   BILIRUBINUR NEGATIVE 11/03/2023 0601   BILIRUBINUR negative 12/08/2019 0000   BILIRUBINUR NEG 12/29/2015 1444   KETONESUR NEGATIVE 11/03/2023 0601   PROTEINUR NEGATIVE 11/03/2023 0601   UROBILINOGEN 0.2 12/08/2019 0000   UROBILINOGEN 0.2 04/17/2015 1618   NITRITE NEGATIVE 11/03/2023 0601   LEUKOCYTESUR NEGATIVE 11/03/2023 0601   Sepsis Labs Recent Labs  Lab 11/07/23 0322 11/07/23 2334 11/08/23 0253 11/09/23 0340  WBC 1.1* 1.7* 1.9* 5.1   Microbiology Recent Results (from the past 240 hours)  Resp panel by RT-PCR (RSV, Flu A&B, Covid) Anterior Nasal Swab     Status: None   Collection Time: 11/03/23  4:14 AM    Specimen: Anterior Nasal Swab  Result Value Ref Range Status   SARS Coronavirus 2 by RT PCR NEGATIVE NEGATIVE Final    Comment: (NOTE) SARS-CoV-2 target nucleic acids are NOT DETECTED.  The SARS-CoV-2 RNA is generally detectable in upper respiratory specimens during the acute phase of infection. The lowest concentration of SARS-CoV-2 viral copies this assay can detect is 138 copies/mL. A negative result does not preclude SARS-Cov-2 infection and should not be used as the sole basis for treatment or other patient management decisions. A negative result may occur with  improper specimen collection/handling, submission of specimen other than nasopharyngeal swab, presence of viral mutation(s) within the areas targeted by this assay, and inadequate number of viral copies(<138 copies/mL). A negative result must be combined with clinical observations, patient history, and epidemiological information. The expected result is Negative.  Fact Sheet for Patients:  BloggerCourse.com  Fact Sheet for Healthcare Providers:  SeriousBroker.it  This test is no t yet approved or cleared by the [place] FDA and  has been authorized for detection and/or diagnosis of SARS-CoV-2 by FDA under an Emergency Use Authorization (EUA). This EUA will remain  in effect (meaning this test can be used) for the duration of the COVID-19 declaration under Section 564(b)(1) of the Act, 21 U.S.C.section 360bbb-3(b)(1), unless the authorization is terminated  or revoked sooner.       Influenza A by PCR NEGATIVE NEGATIVE Final   Influenza B by PCR NEGATIVE NEGATIVE Final    Comment: (NOTE) The Xpert Xpress SARS-CoV-2/FLU/RSV plus assay is intended as an aid in the diagnosis of influenza from Nasopharyngeal swab specimens and should not be used as a sole basis for treatment. Nasal washings and aspirates are unacceptable for Xpert Xpress  SARS-CoV-2/FLU/RSV testing.  Fact Sheet for Patients: BloggerCourse.com  Fact Sheet for Healthcare Providers: SeriousBroker.it  This test is not yet approved or cleared by the [place] FDA and has been authorized for detection and/or diagnosis of SARS-CoV-2 by FDA under an Emergency Use Authorization (EUA). This EUA will remain in effect (meaning this test can be used) for the duration of the COVID-19 declaration under Section 564(b)(1) of the Act, 21 U.S.C. section 360bbb-3(b)(1), unless the authorization is terminated or revoked.     Resp Syncytial Virus by PCR NEGATIVE NEGATIVE Final    Comment: (NOTE) Fact Sheet for Patients: BloggerCourse.com  Fact Sheet for Healthcare Providers: SeriousBroker.it  This test is not yet approved or cleared by the [place] FDA and has been authorized for detection and/or diagnosis of SARS-CoV-2 by FDA under an Emergency Use Authorization (EUA). This EUA will remain in effect (meaning this test can be used) for the duration of the COVID-19 declaration under Section 564(b)(1) of the Act, 21 U.S.C. section 360bbb-3(b)(1), unless the authorization is terminated or  revoked.  Performed at [facility], [address] [place], [place] [address]   Blood Culture (routine x 2)     Status: None   Collection Time: 11/03/23  4:57 AM   Specimen: BLOOD  Result Value Ref Range Status   Specimen Description   Final    BLOOD SITE NOT SPECIFIED Performed at [facility], [address], [place], [place] [address]    Special Requests   Final    BOTTLES DRAWN AEROBIC AND ANAEROBIC Blood Culture results may not be optimal due to an inadequate volume of blood received in culture bottles Performed at [facility], [address] [place], [place] [address]    Culture   Final    NO GROWTH 5  DAYS Performed at [facility], [address], [place], [place] [address]    Report Status 11/08/2023 FINAL  Final  Blood Culture (routine x 2)     Status: None   Collection Time: 11/03/23  5:01 AM   Specimen: BLOOD LEFT HAND  Result Value Ref Range Status   Specimen Description   Final    BLOOD LEFT HAND Performed at [facility], [address], [place], [place] [address]    Special Requests   Final    BOTTLES DRAWN AEROBIC AND ANAEROBIC Blood Culture results may not be optimal due to an inadequate volume of blood received in culture bottles Performed at [facility], [address], [place], [place] [address]    Culture   Final    NO GROWTH 5 DAYS Performed at [facility], [address] [place], [place] [address]    Report Status 11/08/2023 FINAL  Final  MRSA Next Gen by PCR, Nasal     Status: Abnormal   Collection Time: 11/04/23 10:48 AM   Specimen: Nasal Mucosa; Nasal Swab  Result Value Ref Range Status   MRSA by PCR Next Gen DETECTED (A) NOT DETECTED Final    Comment: CRITICAL RESULT CALLED TO, READ BACK BY AND VERIFIED WITH: Manuella Ghazi RN @ 1150 ON 11/04/23 CAL (NOTE) The GeneXpert MRSA Assay (FDA approved for NASAL specimens only), is one component of a comprehensive MRSA colonization surveillance program. It is not intended to diagnose MRSA infection nor to guide or monitor treatment for MRSA infections. Test performance is not FDA approved in patients less than 16 years old. Performed at [facility], [address] [place], [place] [address]      Time coordinating discharge: 35 minutes  SIGNED:   Glade Lloyd, MD  [facility] 11/09/2023, 10:08 AM

## 2023-11-12 ENCOUNTER — Inpatient Hospital Stay: Payer: 59

## 2023-11-12 ENCOUNTER — Telehealth: Payer: Self-pay | Admitting: Radiation Therapy

## 2023-11-12 NOTE — Telephone Encounter (Signed)
I attempted to contact pt, but the phone number listed will not complete a call. I then left a voicemail on her daughter's line requesting a call back about rescheduling the brain MRI and visit with Dr. [name].   Jalene Mullet R.T.(R)(T) Radiation Special Procedures Lead

## 2023-11-13 ENCOUNTER — Inpatient Hospital Stay: Payer: 59 | Admitting: Internal Medicine

## 2023-11-13 ENCOUNTER — Other Ambulatory Visit: Payer: Self-pay

## 2023-11-13 ENCOUNTER — Emergency Department (HOSPITAL_COMMUNITY): Payer: 59

## 2023-11-13 ENCOUNTER — Encounter (HOSPITAL_COMMUNITY): Payer: Self-pay

## 2023-11-13 ENCOUNTER — Inpatient Hospital Stay (HOSPITAL_COMMUNITY)
Admission: EM | Admit: 2023-11-13 | Discharge: 2023-11-15 | DRG: 641 | Disposition: A | Discharge status: Home Health | Payer: 59 | Attending: Family Medicine | Admitting: Family Medicine

## 2023-11-13 DIAGNOSIS — Z9981 Dependence on supplemental oxygen: Secondary | ICD-10-CM

## 2023-11-13 DIAGNOSIS — R531 Weakness: Secondary | ICD-10-CM | POA: Diagnosis present

## 2023-11-13 DIAGNOSIS — Z90710 Acquired absence of both cervix and uterus: Secondary | ICD-10-CM

## 2023-11-13 DIAGNOSIS — Z885 Allergy status to narcotic agent status: Secondary | ICD-10-CM

## 2023-11-13 DIAGNOSIS — J449 Chronic obstructive pulmonary disease, unspecified: Secondary | ICD-10-CM | POA: Diagnosis present

## 2023-11-13 DIAGNOSIS — E876 Hypokalemia: Principal | ICD-10-CM | POA: Diagnosis present

## 2023-11-13 DIAGNOSIS — Z8269 Family history of other diseases of the musculoskeletal system and connective tissue: Secondary | ICD-10-CM

## 2023-11-13 DIAGNOSIS — Z825 Family history of asthma and other chronic lower respiratory diseases: Secondary | ICD-10-CM

## 2023-11-13 DIAGNOSIS — D573 Sickle-cell trait: Secondary | ICD-10-CM | POA: Diagnosis present

## 2023-11-13 DIAGNOSIS — C7931 Secondary malignant neoplasm of brain: Secondary | ICD-10-CM | POA: Diagnosis present

## 2023-11-13 DIAGNOSIS — D630 Anemia in neoplastic disease: Secondary | ICD-10-CM | POA: Diagnosis present

## 2023-11-13 DIAGNOSIS — F419 Anxiety disorder, unspecified: Secondary | ICD-10-CM | POA: Diagnosis present

## 2023-11-13 DIAGNOSIS — Z83438 Family history of other disorder of lipoprotein metabolism and other lipidemia: Secondary | ICD-10-CM

## 2023-11-13 DIAGNOSIS — Z8249 Family history of ischemic heart disease and other diseases of the circulatory system: Secondary | ICD-10-CM

## 2023-11-13 DIAGNOSIS — Z66 Do not resuscitate: Secondary | ICD-10-CM | POA: Diagnosis present

## 2023-11-13 DIAGNOSIS — Z803 Family history of malignant neoplasm of breast: Secondary | ICD-10-CM

## 2023-11-13 DIAGNOSIS — F32A Depression, unspecified: Secondary | ICD-10-CM | POA: Diagnosis present

## 2023-11-13 DIAGNOSIS — Z79899 Other long term (current) drug therapy: Secondary | ICD-10-CM

## 2023-11-13 DIAGNOSIS — Z7952 Long term (current) use of systemic steroids: Secondary | ICD-10-CM

## 2023-11-13 DIAGNOSIS — Z853 Personal history of malignant neoplasm of breast: Secondary | ICD-10-CM

## 2023-11-13 DIAGNOSIS — J9612 Chronic respiratory failure with hypercapnia: Secondary | ICD-10-CM | POA: Diagnosis present

## 2023-11-13 DIAGNOSIS — Z7982 Long term (current) use of aspirin: Secondary | ICD-10-CM

## 2023-11-13 DIAGNOSIS — R9431 Abnormal electrocardiogram [ECG] [EKG]: Secondary | ICD-10-CM | POA: Insufficient documentation

## 2023-11-13 DIAGNOSIS — R52 Pain, unspecified: Secondary | ICD-10-CM | POA: Diagnosis not present

## 2023-11-13 DIAGNOSIS — R7989 Other specified abnormal findings of blood chemistry: Secondary | ICD-10-CM | POA: Diagnosis present

## 2023-11-13 DIAGNOSIS — D61818 Other pancytopenia: Secondary | ICD-10-CM | POA: Diagnosis present

## 2023-11-13 DIAGNOSIS — Z923 Personal history of irradiation: Secondary | ICD-10-CM

## 2023-11-13 DIAGNOSIS — I10 Essential (primary) hypertension: Secondary | ICD-10-CM | POA: Diagnosis present

## 2023-11-13 DIAGNOSIS — C787 Secondary malignant neoplasm of liver and intrahepatic bile duct: Secondary | ICD-10-CM | POA: Diagnosis present

## 2023-11-13 DIAGNOSIS — Z87891 Personal history of nicotine dependence: Secondary | ICD-10-CM

## 2023-11-13 DIAGNOSIS — Z1152 Encounter for screening for COVID-19: Secondary | ICD-10-CM

## 2023-11-13 DIAGNOSIS — R627 Adult failure to thrive: Secondary | ICD-10-CM | POA: Insufficient documentation

## 2023-11-13 DIAGNOSIS — J9611 Chronic respiratory failure with hypoxia: Secondary | ICD-10-CM | POA: Diagnosis present

## 2023-11-13 DIAGNOSIS — D696 Thrombocytopenia, unspecified: Secondary | ICD-10-CM | POA: Diagnosis present

## 2023-11-13 DIAGNOSIS — D72829 Elevated white blood cell count, unspecified: Secondary | ICD-10-CM | POA: Diagnosis present

## 2023-11-13 DIAGNOSIS — C3490 Malignant neoplasm of unspecified part of unspecified bronchus or lung: Secondary | ICD-10-CM | POA: Diagnosis present

## 2023-11-13 LAB — URINALYSIS, W/ REFLEX TO CULTURE (INFECTION SUSPECTED)
Bilirubin Urine: NEGATIVE
Glucose, UA: NEGATIVE mg/dL
Ketones, ur: NEGATIVE mg/dL
Leukocytes,Ua: NEGATIVE
Nitrite: NEGATIVE
Protein, ur: 100 mg/dL — AB
Specific Gravity, Urine: 1.011 (ref 1.005–1.030)
pH: 5 (ref 5.0–8.0)

## 2023-11-13 LAB — CBC WITH DIFFERENTIAL/PLATELET
Abs Immature Granulocytes: 1.7 10*3/uL — ABNORMAL HIGH (ref 0.00–0.07)
Basophils Absolute: 0 10*3/uL (ref 0.0–0.1)
Basophils Relative: 0 %
Eosinophils Absolute: 0 10*3/uL (ref 0.0–0.5)
Eosinophils Relative: 0 %
HCT: 27.8 % — ABNORMAL LOW (ref 36.0–46.0)
Hemoglobin: 8.9 g/dL — ABNORMAL LOW (ref 12.0–15.0)
Lymphocytes Relative: 11 %
Lymphs Abs: 2.6 10*3/uL (ref 0.7–4.0)
MCH: 28.9 pg (ref 26.0–34.0)
MCHC: 32 g/dL (ref 30.0–36.0)
MCV: 90.3 fL (ref 80.0–100.0)
Metamyelocytes Relative: 7 %
Monocytes Absolute: 1.2 10*3/uL — ABNORMAL HIGH (ref 0.1–1.0)
Monocytes Relative: 5 %
Neutro Abs: 18.2 10*3/uL — ABNORMAL HIGH (ref 1.7–7.7)
Neutrophils Relative %: 77 %
Platelets: 49 10*3/uL — ABNORMAL LOW (ref 150–400)
RBC: 3.08 MIL/uL — ABNORMAL LOW (ref 3.87–5.11)
RDW: 15.4 % (ref 11.5–15.5)
WBC: 23.6 10*3/uL — ABNORMAL HIGH (ref 4.0–10.5)
nRBC: 0.2 % (ref 0.0–0.2)

## 2023-11-13 LAB — COMPREHENSIVE METABOLIC PANEL
ALT: 14 U/L (ref 0–44)
AST: 15 U/L (ref 15–41)
Albumin: 2.6 g/dL — ABNORMAL LOW (ref 3.5–5.0)
Alkaline Phosphatase: 80 U/L (ref 38–126)
Anion gap: 6 (ref 5–15)
BUN: 14 mg/dL (ref 8–23)
CO2: 34 mmol/L — ABNORMAL HIGH (ref 22–32)
Calcium: 8.1 mg/dL — ABNORMAL LOW (ref 8.9–10.3)
Chloride: 100 mmol/L (ref 98–111)
Creatinine, Ser: 0.8 mg/dL (ref 0.44–1.00)
GFR, Estimated: >60 mL/min (ref >60)
Glucose, Bld: 94 mg/dL (ref 70–99)
Potassium: 2.5 mmol/L — CL (ref 3.5–5.1)
Sodium: 140 mmol/L (ref 135–145)
Total Bilirubin: 0.7 mg/dL (ref 0.0–1.2)
Total Protein: 5.4 g/dL — ABNORMAL LOW (ref 6.5–8.1)

## 2023-11-13 LAB — TROPONIN I (HIGH SENSITIVITY)
Troponin I (High Sensitivity): 22 ng/L — ABNORMAL HIGH (ref <18)
Troponin I (High Sensitivity): 22 ng/L — ABNORMAL HIGH (ref <18)

## 2023-11-13 LAB — RESP PANEL BY RT-PCR (RSV, FLU A&B, COVID)  RVPGX2
Influenza A by PCR: NEGATIVE
Influenza B by PCR: NEGATIVE
Resp Syncytial Virus by PCR: NEGATIVE
SARS Coronavirus 2 by RT PCR: NEGATIVE

## 2023-11-13 LAB — LIPASE, BLOOD: Lipase: 23 U/L (ref 11–51)

## 2023-11-13 MED ORDER — ACETAMINOPHEN 325 MG PO TABS
650.0000 mg | ORAL_TABLET | Freq: Four times a day (QID) | ORAL | Status: DC | PRN
  Administered 2023-11-14 (×2): 650 mg via ORAL
  Filled 2023-11-13 (×2): qty 2

## 2023-11-13 MED ORDER — SODIUM CHLORIDE 0.9 % IV BOLUS
500.0000 mL | Freq: Once | INTRAVENOUS | Status: AC
  Administered 2023-11-13: 500 mL via INTRAVENOUS

## 2023-11-13 MED ORDER — POTASSIUM CHLORIDE 2 MEQ/ML IV SOLN
INTRAVENOUS | Status: DC
  Filled 2023-11-13: qty 1000

## 2023-11-13 MED ORDER — PROCHLORPERAZINE EDISYLATE 10 MG/2ML IJ SOLN: 5.0000 mg | Freq: Four times a day (QID) | INTRAMUSCULAR | Status: DC | PRN

## 2023-11-13 MED ORDER — POLYETHYLENE GLYCOL 3350 17 G PO PACK: 17.0000 g | PACK | Freq: Every day | ORAL | Status: DC | PRN

## 2023-11-13 MED ORDER — POTASSIUM CHLORIDE 10 MEQ/100ML IV SOLN
10.0000 meq | INTRAVENOUS | Status: AC
  Administered 2023-11-13 (×2): 10 meq via INTRAVENOUS
  Filled 2023-11-13 (×2): qty 100

## 2023-11-13 MED ORDER — PANTOPRAZOLE SODIUM 40 MG PO TBEC
40.0000 mg | DELAYED_RELEASE_TABLET | Freq: Every day | ORAL | Status: DC
  Administered 2023-11-14 – 2023-11-15 (×2): 40 mg via ORAL
  Filled 2023-11-13 (×2): qty 1

## 2023-11-13 MED ORDER — MELATONIN 5 MG PO TABS: 5.0000 mg | ORAL_TABLET | Freq: Every evening | ORAL | Status: DC | PRN

## 2023-11-13 MED ORDER — MONTELUKAST SODIUM 10 MG PO TABS
10.0000 mg | ORAL_TABLET | Freq: Every day | ORAL | Status: DC
  Administered 2023-11-14 (×2): 10 mg via ORAL
  Filled 2023-11-13 (×2): qty 1

## 2023-11-13 NOTE — H&P (Addendum)
 History and Physical  [name] FMW:[id number] DOB: 1960/12/13 DOA: 11/13/2023  Referring physician: Dr. Laurice, EDP  PCP: [name] Erin Mickey., FNP  Outpatient Specialists: Medical oncology. Patient coming from: Home.  Chief Complaint: Generalized body aches.  HPI: [name] is a 62 y.o. 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  Associated with decreased p.o. intake for the past few days.  No reported subjective fevers or chills.  In the ED, workup essentially unremarkable except for low potassium of 2.5.  Potassium replacement initiated in the ED.  TRH, hospitalist service, was asked to admit for further management.  ED Course: Temperature 98.5.  BP 139/77, pulse 108, respiration 17, also O2 sat 100% on 2 L.  Lab studies notable for serum potassium 2.5, calcium  8.1, albumin 2.6, WBC 23.6, involving 8.9, platelet count 49.    To note, the patient received IV Granix  few days ago by hematology/oncology.  Review of Systems: Review of systems as noted in the HPI. All other systems reviewed and are negative.   Past Medical History:  Diagnosis Date   Acute on chronic respiratory failure (HCC) 10/09/2016   Allergic rhinitis    Breast cancer (HCC) 2016   right breast   COPD, mild (HCC) FOLLOWED BY DR TZMU   Depression    GERD (gastroesophageal reflux disease)    HTN (hypertension)    Hypertension    Iron deficiency anemia    Long-term current use of steroids SYMBICORT  INHALER   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    Overweight (BMI 25.0-29.9) 05/11/2016   Palpitations    Personal history of radiation therapy 2016   Sarcoidosis STABLE PER CXR JUNE 2013   Shortness of breath    Sickle cell trait (HCC)    Past Surgical  History:  Procedure Laterality Date   ADJUSTABLE SUTURE MANIPULATION  05/22/2012   Procedure: ADJUSTABLE SUTURE MANIPULATION;  Surgeon: Ozell Erin Mirza, MD;  Location: [facility];  Service: Ophthalmology;  Laterality: Right;   BREAST LUMPECTOMY Right 2016   BRONCHIAL BIOPSY  07/09/2023   Procedure: BRONCHIAL BIOPSIES;  Surgeon: Shelah Lamar RAMAN, MD;  Location: [facility] ENDOSCOPY;  Service: Pulmonary;;   BRONCHIAL BRUSHINGS  07/09/2023   Procedure: BRONCHIAL BRUSHINGS;  Surgeon: Shelah Lamar RAMAN, MD;  Location: [facility] ENDOSCOPY;  Service: Pulmonary;;   BRONCHIAL NEEDLE ASPIRATION BIOPSY  07/09/2023   Procedure: BRONCHIAL NEEDLE ASPIRATION BIOPSIES;  Surgeon: Shelah Lamar RAMAN, MD;  Location: [facility] ENDOSCOPY;  Service: Pulmonary;;   CESAREAN SECTION  1986   W/ BILATERAL TUBAL LIGATION   COLONOSCOPY WITH PROPOFOL  N/A 02/21/2019   Procedure: COLONOSCOPY WITH PROPOFOL ;  Surgeon: Abran Norleen SAILOR, MD;  Location: [facility] ENDOSCOPY;  Service: Endoscopy;  Laterality: N/A;   EYE SURGERY     IR IMAGING GUIDED PORT INSERTION  08/01/2023   MEDIAN RECTUS REPAIR  05/22/2012   Procedure: MEDIAN RECTUS REPAIR;  Surgeon: Ozell Erin Mirza, MD;  Location: [facility];  Service: Ophthalmology;  Laterality: Bilateral;  INFERIOR RECTUS RESECTION WITH ADJUSTIBLE SUTURES RIGHT EYE    POLYPECTOMY  02/21/2019   Procedure: POLYPECTOMY;  Surgeon: Abran Norleen SAILOR, MD;  Location: [facility] ENDOSCOPY;  Service: Endoscopy;;   RADIOACTIVE SEED GUIDED PARTIAL MASTECTOMY WITH AXILLARY SENTINEL LYMPH NODE BIOPSY Right 08/18/2015   Procedure: RADIOACTIVE SEED GUIDED PARTIAL MASTECTOMY WITH  AXILLARY SENTINEL LYMPH NODE BIOPSY;  Surgeon: Jina Nephew, MD;  Location: [facility];  Service: General;  Laterality: Right;   TOTAL ROBOTIC ASSISTED LAPAROSCOPIC HYSTERECTOMY  12-30-2010   SYMPTOMATIC UTERINE FIBROIDS   UPPER TEETH EXTRACTION'S  1992   VIDEO BRONCHOSCOPY WITH RADIAL ENDOBRONCHIAL ULTRASOUND  07/09/2023   Procedure:  VIDEO BRONCHOSCOPY WITH RADIAL ENDOBRONCHIAL ULTRASOUND;  Surgeon: Shelah Lamar RAMAN, MD;  Location: [facility] ENDOSCOPY;  Service: Pulmonary;;    Social History:  reports that she quit smoking about 20 years ago. Her smoking use included cigarettes. She started smoking about 40 years ago. She has a 20 pack-year smoking history. She has never used smokeless tobacco. She reports that she does not drink alcohol and does not use drugs.   Allergies  Allergen Reactions   Codeine Other (See Comments)    Avoids-felt bad when taken before Pt claims she can have this but does not like to take    Family History  Problem Relation Age of Onset   Hyperlipidemia Mother    Asthma Mother    Lupus Mother    Breast cancer Mother 65   Hypertension Father    Hyperlipidemia Father    Hypertension Sister    Hypertension Brother    Cancer Neg Hx    Diabetes Neg Hx    Coronary artery disease Neg Hx       Prior to Admission medications   Medication Sig Start Date End Date Taking? Authorizing Provider  acetaminophen  (TYLENOL ) 325 MG tablet Take 650 mg by mouth every 6 (six) hours as needed for mild pain or moderate pain.    [provider]  albuterol  (VENTOLIN  HFA) 108 (90 Base) MCG/ACT inhaler INHALE 2 PUFFS EVERY 6 HOURS AS NEEDED FOR WHEEZING OR SHORTNESS OF BREATH 09/23/19   Darlean Ozell NOVAK, MD  aspirin  81 MG chewable tablet Chew 1 tablet (81 mg total) by mouth daily. 09/22/23   Swayze, Ava, DO  atorvastatin  (LIPITOR) 20 MG tablet TAKE 1 TABLET (20 MG TOTAL) BY MOUTH DAILY. 03/28/21   Chandra Toribio POUR, MD  dexamethasone  (DECADRON ) 4 MG tablet Take 1 tablet (4 mg total) by mouth 2 (two) times daily. 10/19/23   Raenelle Coria, MD  fluticasone  (FLONASE ) 50 MCG/ACT nasal spray USE 2 SPRAYS IN BOTH  NOSTRILS DAILY AS NEEDED  FOR ALLERGIES 08/15/21   Wert, Michael B, MD  HYDROcodone -acetaminophen  (NORCO) 5-325 MG tablet Take 1 tablet by mouth every 6 (six) hours as needed for moderate pain (pain score 4-6).  10/22/23   Pickenpack-Cousar, Athena N, NP  levETIRAcetam  (KEPPRA ) 750 MG tablet Take 1 tablet (750 mg total) by mouth 2 (two) times daily. 09/21/23   Swayze, Ava, DO  lidocaine -prilocaine  (EMLA ) cream Apply to affected area once Patient not taking: Reported on 11/03/2023 08/01/23   Sherrod Sherrod, MD  MAGNESIUM -OXIDE 400 (240 Mg) MG tablet Take 1 tablet by mouth every morning. 10/19/23   [provider]  metoprolol  tartrate (LOPRESSOR ) 25 MG tablet Take 1 tablet (25 mg total) by mouth daily. 07/09/23   Shelah Lamar RAMAN, MD  montelukast  (SINGULAIR ) 10 MG tablet Take 10 mg by mouth at bedtime. 07/31/22   [provider]  Multiple Vitamin (MULTIVITAMIN) capsule Take 1 capsule by mouth daily with breakfast.    [provider]  omeprazole  (PRILOSEC) 20 MG capsule TAKE 1 CAPSULE EVERY DAY Patient taking differently: Take 20 mg by mouth daily before breakfast. 01/03/21   Chandra Toribio POUR, MD  ondansetron  (ZOFRAN ) 4 MG tablet Take 1  tablet (4 mg total) by mouth every 6 (six) hours as needed for nausea. Patient not taking: Reported on 11/03/2023 06/28/23   Will Almarie MATSU, MD  OXYGEN  Inhale 2-3 L/min into the lungs See admin instructions. Inhale 2-3 L/min into the lungs CONTINUOUSLY; 2 L/min at rest and 3 L/min if exerted    [provider]  potassium chloride  SA (KLOR-CON  M) 20 MEQ tablet Take 1 tablet (20 mEq total) by mouth daily for 14 days. 11/09/23 11/23/23  Cheryle Page, MD  prochlorperazine  (COMPAZINE ) 10 MG tablet Take 1 tablet (10 mg total) by mouth every 6 (six) hours as needed for nausea or vomiting. Patient not taking: Reported on 11/03/2023 08/01/23   Sherrod Sherrod, MD  sertraline  (ZOLOFT ) 50 MG tablet TAKE 1 TABLET EVERY DAY 09/11/20   Chandra Toribio POUR, MD  STIOLTO RESPIMAT  2.5-2.5 MCG/ACT AERS USE 2 INHALATIONS BY MOUTH DAILY 09/17/23   Wert, Michael B, MD  triamterene -hydrochlorothiazide  (MAXZIDE -25) 37.5-25 MG tablet Take 1 tablet by mouth daily. 09/07/23    [provider]    Physical Exam: BP 122/77   Pulse 100   Temp 98.5 F (36.9 C) (Oral)   Resp 18   SpO2 97%   General: 62 y.o. year-old female well developed well nourished in no acute distress.  Alert and oriented x3. Cardiovascular: Regular rate and rhythm with no rubs or gallops.  No thyromegaly or JVD noted.  No lower extremity edema bilaterally. Respiratory: Clear to auscultation with no wheezes or rales.  Poor inspiratory effort. Abdomen: Soft nontender nondistended with normal bowel sounds x4 quadrants. Muskuloskeletal: No cyanosis, clubbing or edema noted bilaterally Neuro: CN II-XII intact, strength, sensation, reflexes Skin: No ulcerative lesions noted or rashes Psychiatry: Judgement and insight appear normal. Mood is appropriate for condition and setting          Labs on Admission:  Basic Metabolic Panel: Recent Labs  Lab 11/07/23 0322 11/08/23 0253 11/09/23 0340 11/13/23 1712  NA 135 137 138 140  K 3.3* 3.0* 3.6 2.5*  CL 99 100 99 100  CO2 32 29 28 34*  GLUCOSE 119* 119* 99 94  BUN 11 13 12 14   CREATININE 0.72 0.63 0.71 0.80  CALCIUM  8.2* 8.5* 8.8* 8.1*  MG 1.4* 1.2* 1.7  --    Liver Function Tests: Recent Labs  Lab 11/13/23 1712  AST 15  ALT 14  ALKPHOS 80  BILITOT 0.7  PROT 5.4*  ALBUMIN 2.6*   Recent Labs  Lab 11/13/23 1712  LIPASE 23   No results for input(s): AMMONIA in the last 168 hours. CBC: Recent Labs  Lab 11/07/23 0322 11/07/23 2334 11/08/23 0253 11/08/23 1054 11/09/23 0340 11/13/23 1629  WBC 1.1* 1.7* 1.9*  --  5.1 23.6*  NEUTROABS 0.4*  --  1.0*  --  2.6 18.2*  HGB 8.4* 8.5* 8.3* 8.0* 9.1* 8.9*  HCT 24.8* 25.5* 25.2* 23.6* 27.6* 27.8*  MCV 86.4 87.0 86.3  --  86.3 90.3  PLT 24* 17* 15*  --  42* 49*   Cardiac Enzymes: No results for input(s): CKTOTAL, CKMB, CKMBINDEX, TROPONINI in the last 168 hours.  BNP (last 3 results) No results for input(s): BNP in the last 8760 hours.  ProBNP (last 3  results) No results for input(s): PROBNP in the last 8760 hours.  CBG: No results for input(s): GLUCAP in the last 168 hours.  Radiological Exams on Admission: DG Chest Port 1 View Result Date: 11/13/2023 CLINICAL DATA:  Weakness.  History of lung cancer. EXAM:  PORTABLE CHEST 1 VIEW COMPARISON:  11/03/2023 radiograph and CT FINDINGS: Right chest port remains in place. Stable radiographic appearance of right lung volume loss, apical opacity and streaky right midlung opacity. The left upper lobe opacity on CT is not well seen by radiograph. Stable heart size and mediastinal contours. Slight blunting of right costophrenic angle with small effusion on CT. No pneumothorax. No pulmonary edema. IMPRESSION: 1. Stable radiographic appearance of the chest. Right lung findings consistent with known neoplasm. The left upper lobe opacity on prior CT is not well demonstrated by radiograph. 2. Small right pleural effusion, unchanged. Electronically Signed   By: Andrea Gasman M.D.   On: 11/13/2023 20:01    EKG: I independently viewed the EKG done and my findings are as followed: Sinus tachycardia rate of 103.  Nonspecific ST-T changes.  QTc 503.  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 Check magnesium  level Repeat BMP in the morning.  Elevated troponin, flat Suspect demand ischemia from sinus tachycardia. Troponin 22, 22. No reported anginal symptoms No evidence of acute  ischemia on twelve-lead EKG.  QTc prolongation QTc on admission 12 lead EKG 503 Avoid QTc prolonging agents Optimize magnesium  and potassium levels Monitor on telemetry.  Generalized weakness PT OT assessment Fall precautions  Anemia of chronic disease Hemoglobin 8.9 K No reported overt bleeding Monitor for now  Leukocytosis, recent use of IV Granix  Suspect iatrogenic No evidence of active infective process. UA negative for pyuria Chest x-ray nonacute.  Thrombocytopenia Platelet count 49 No reported overt bleeding at this time Hold off antiplatelets and anticoagulation for now  Brain metastasis from stage IV non-small cell lung cancer Resume home Decadron  and Keppra  Followed by Dr. Gatha medical oncology  Chronic anxiety/depression Resume home Zoloft .  Hypertension Resume home p.o. Lopressor  Monitor vital signs.  COPD Chronic hypoxia Stable No acute issues.   Time: 75 minutes.   DVT prophylaxis: SCDs due to thrombocytopenia and anemia.  Code Status: DNR  Family Communication: None at bedside.  Disposition Plan: Admitted to telemetry unit  Consults called: None.  Admission status: Observation status.   Status is: Observation    Terry LOISE Hurst MD [facility] Pager [phone]  If 7PM-7AM, please contact night-coverage [email or web address] Password [email or web address]  11/13/2023, 10:23 PM

## 2023-11-13 NOTE — ED Provider Notes (Signed)
 [place] EMERGENCY DEPARTMENT AT [facility] Provider Note   CSN: [id number] Arrival date & time: 11/13/23  1526     History  Chief Complaint  Patient presents with   Generalized Body Aches    [name] is a 62 y.o. female.  62 year old female with prior medical history as detailed below presents for evaluation.  Patient complains of generalized bodyaches, lack of appetite, weakness.  She also complains of loose diarrheal stool.  Many of her complaints appear to be chronic in nature.  She denies pain.  She denies shortness of breath.  She denies fever.  The history is provided by the patient and medical records.       Home Medications Prior to Admission medications   Medication Sig Start Date End Date Taking? Authorizing Provider  acetaminophen  (TYLENOL ) 325 MG tablet Take 650 mg by mouth every 6 (six) hours as needed for mild pain or moderate pain.    [provider]  albuterol  (VENTOLIN  HFA) 108 (90 Base) MCG/ACT inhaler INHALE 2 PUFFS EVERY 6 HOURS AS NEEDED FOR WHEEZING OR SHORTNESS OF BREATH 09/23/19   Darlean Ozell NOVAK, MD  aspirin  81 MG chewable tablet Chew 1 tablet (81 mg total) by mouth daily. 09/22/23   Swayze, Ava, DO  atorvastatin  (LIPITOR) 20 MG tablet TAKE 1 TABLET (20 MG TOTAL) BY MOUTH DAILY. 03/28/21   Chandra Toribio POUR, MD  dexamethasone  (DECADRON ) 4 MG tablet Take 1 tablet (4 mg total) by mouth 2 (two) times daily. 10/19/23   Raenelle Coria, MD  fluticasone  (FLONASE ) 50 MCG/ACT nasal spray USE 2 SPRAYS IN BOTH  NOSTRILS DAILY AS NEEDED  FOR ALLERGIES 08/15/21   Wert, Michael B, MD  HYDROcodone -acetaminophen  (NORCO) 5-325 MG tablet Take 1 tablet by mouth every 6 (six) hours as needed for moderate pain (pain score 4-6). 10/22/23   Pickenpack-Cousar, Athena N, NP  levETIRAcetam  (KEPPRA ) 750 MG tablet Take 1 tablet (750 mg total) by mouth 2 (two) times daily. 09/21/23   Swayze, Ava, DO  lidocaine -prilocaine  (EMLA ) cream Apply to affected area  once Patient not taking: Reported on 11/03/2023 08/01/23   Sherrod Sherrod, MD  MAGNESIUM -OXIDE 400 (240 Mg) MG tablet Take 1 tablet by mouth every morning. 10/19/23   [provider]  metoprolol  tartrate (LOPRESSOR ) 25 MG tablet Take 1 tablet (25 mg total) by mouth daily. 07/09/23   Shelah Lamar RAMAN, MD  montelukast  (SINGULAIR ) 10 MG tablet Take 10 mg by mouth at bedtime. 07/31/22   [provider]  Multiple Vitamin (MULTIVITAMIN) capsule Take 1 capsule by mouth daily with breakfast.    [provider]  omeprazole  (PRILOSEC) 20 MG capsule TAKE 1 CAPSULE EVERY DAY Patient taking differently: Take 20 mg by mouth daily before breakfast. 01/03/21   Chandra Toribio POUR, MD  ondansetron  (ZOFRAN ) 4 MG tablet Take 1 tablet (4 mg total) by mouth every 6 (six) hours as needed for nausea. Patient not taking: Reported on 11/03/2023 06/28/23   Will Almarie MATSU, MD  OXYGEN  Inhale 2-3 L/min into the lungs See admin instructions. Inhale 2-3 L/min into the lungs CONTINUOUSLY; 2 L/min at rest and 3 L/min if exerted    [provider]  potassium chloride  SA (KLOR-CON  M) 20 MEQ tablet Take 1 tablet (20 mEq total) by mouth daily for 14 days. 11/09/23 11/23/23  Cheryle Page, MD  prochlorperazine  (COMPAZINE ) 10 MG tablet Take 1 tablet (10 mg total) by mouth every 6 (six) hours as needed for nausea or vomiting. Patient not taking:  Reported on 11/03/2023 08/01/23   Sherrod Sherrod, MD  sertraline  (ZOLOFT ) 50 MG tablet TAKE 1 TABLET EVERY DAY 09/11/20   Chandra Toribio POUR, MD  STIOLTO RESPIMAT  2.5-2.5 MCG/ACT AERS USE 2 INHALATIONS BY MOUTH DAILY 09/17/23   Wert, Michael B, MD  triamterene -hydrochlorothiazide  (MAXZIDE -25) 37.5-25 MG tablet Take 1 tablet by mouth daily. 09/07/23   [provider]      Allergies    Codeine    Review of Systems   Review of Systems  All other systems reviewed and are negative.   Physical Exam Updated Vital Signs BP 117/70   Pulse 85   Temp 98.5  F (36.9 C) (Oral)   Resp 16 Comment: Simultaneous filing. User may not have seen previous data.  SpO2 100%  Physical Exam Vitals and nursing note reviewed.  Constitutional:      General: She is not in acute distress.    Appearance: Normal appearance. She is well-developed.  HENT:     Head: Normocephalic and atraumatic.  Eyes:     Conjunctiva/sclera: Conjunctivae normal.     Pupils: Pupils are equal, round, and reactive to light.  Cardiovascular:     Rate and Rhythm: Normal rate and regular rhythm.     Heart sounds: Normal heart sounds.  Pulmonary:     Effort: Pulmonary effort is normal. No respiratory distress.     Breath sounds: Normal breath sounds.  Abdominal:     General: There is no distension.     Palpations: Abdomen is soft.     Tenderness: There is no abdominal tenderness.  Musculoskeletal:        General: No deformity. Normal range of motion.     Cervical back: Normal range of motion and neck supple.  Skin:    General: Skin is warm and dry.  Neurological:     General: No focal deficit present.     Mental Status: She is alert and oriented to person, place, and time.     ED Results / Procedures / Treatments   Labs (all labs ordered are listed, but only abnormal results are displayed) Labs Reviewed  CBC WITH DIFFERENTIAL/PLATELET  COMPREHENSIVE METABOLIC PANEL  URINALYSIS, W/ REFLEX TO CULTURE (INFECTION SUSPECTED)  LIPASE, BLOOD  TROPONIN I (HIGH SENSITIVITY)    EKG None  Radiology No results found.  Procedures Procedures    Medications Ordered in ED Medications  sodium chloride  0.9 % bolus 500 mL (has no administration in time range)    ED Course/ Medical Decision Making/ A&P                                 Medical Decision Making Amount and/or Complexity of Data Reviewed Labs: ordered. Radiology: ordered.  Risk Prescription drug management. Decision regarding hospitalization.    Medical Screen Complete  This patient presented to  the ED with complaint of weakness, body aches, poor appetite.  This complaint involves an extensive number of treatment options. The initial differential diagnosis includes, but is not limited to, metabolic abnormality, etc.  This presentation is: Acute, Chronic, Self-Limited, Previously Undiagnosed, Uncertain Prognosis, Complicated, Systemic Symptoms, and Threat to Life/Bodily Function  Patient with multiple comorbidities and recent admission.  Patient presents with complaint of generalized fatigue, malaise, poor appetite.  Workup is concerning given significant hypokalemia.  Repletion of potassium initiated in the ED.  Patient would likely benefit from admission for treatment of hypokalemia and continued evaluation.  Hospitalist service made aware of case and will evaluate for same.  O  Additional history obtained:  External records from outside sources obtained and reviewed including prior ED visits and prior Inpatient records.    Lab Tests:  I ordered and personally interpreted labs.  The pertinent results include:  cbc cmp ua trop lipase   Imaging Studies ordered:  I ordered imaging studies including cxr  I independently visualized and interpreted obtained imaging which showed nad I agree with the radiologist interpretation.  Problem List / ED Course:  Hypokalemia    Reevaluation:  After the interventions noted above, I reevaluated the patient and found that they have: improved    Disposition:  After consideration of the diagnostic results and the patients response to treatment, I feel that the patent would benefit from admission.          Final Clinical Impression(s) / ED Diagnoses Final diagnoses:  Hypokalemia  Weakness    Rx / DC Orders ED Discharge Orders     None         Laurice Maude BROCKS, MD 11/13/23 2314

## 2023-11-13 NOTE — ED Notes (Addendum)
Critical lab result taken from lab. Potassium 2.5. Dr. Rodena Medin notified. [id number]

## 2023-11-13 NOTE — ED Notes (Signed)
Potassium running at 50 ml/hr due to burning/pt request

## 2023-11-13 NOTE — ED Notes (Signed)
Unsuccessful IV attempt x2.  

## 2023-11-13 NOTE — ED Triage Notes (Signed)
BIBA from home for generalized body aches, decreased PO intake, non-compliant with home meds, hx of lung CA and COPD. 130/70 BP 104 HR 99 temp 14 RR 105 cbg

## 2023-11-14 DIAGNOSIS — Z87891 Personal history of nicotine dependence: Secondary | ICD-10-CM | POA: Diagnosis not present

## 2023-11-14 DIAGNOSIS — C3490 Malignant neoplasm of unspecified part of unspecified bronchus or lung: Secondary | ICD-10-CM | POA: Diagnosis present

## 2023-11-14 DIAGNOSIS — R7989 Other specified abnormal findings of blood chemistry: Secondary | ICD-10-CM | POA: Diagnosis present

## 2023-11-14 DIAGNOSIS — F419 Anxiety disorder, unspecified: Secondary | ICD-10-CM | POA: Diagnosis present

## 2023-11-14 DIAGNOSIS — R52 Pain, unspecified: Secondary | ICD-10-CM | POA: Diagnosis not present

## 2023-11-14 DIAGNOSIS — D696 Thrombocytopenia, unspecified: Secondary | ICD-10-CM | POA: Diagnosis not present

## 2023-11-14 DIAGNOSIS — J9612 Chronic respiratory failure with hypercapnia: Secondary | ICD-10-CM | POA: Diagnosis present

## 2023-11-14 DIAGNOSIS — D630 Anemia in neoplastic disease: Secondary | ICD-10-CM | POA: Diagnosis present

## 2023-11-14 DIAGNOSIS — R627 Adult failure to thrive: Secondary | ICD-10-CM | POA: Insufficient documentation

## 2023-11-14 DIAGNOSIS — I10 Essential (primary) hypertension: Secondary | ICD-10-CM | POA: Diagnosis present

## 2023-11-14 DIAGNOSIS — Z1152 Encounter for screening for COVID-19: Secondary | ICD-10-CM | POA: Diagnosis not present

## 2023-11-14 DIAGNOSIS — J9611 Chronic respiratory failure with hypoxia: Secondary | ICD-10-CM | POA: Diagnosis present

## 2023-11-14 DIAGNOSIS — C787 Secondary malignant neoplasm of liver and intrahepatic bile duct: Secondary | ICD-10-CM | POA: Diagnosis present

## 2023-11-14 DIAGNOSIS — Z8249 Family history of ischemic heart disease and other diseases of the circulatory system: Secondary | ICD-10-CM | POA: Diagnosis not present

## 2023-11-14 DIAGNOSIS — F32A Depression, unspecified: Secondary | ICD-10-CM | POA: Diagnosis present

## 2023-11-14 DIAGNOSIS — R9431 Abnormal electrocardiogram [ECG] [EKG]: Secondary | ICD-10-CM | POA: Diagnosis present

## 2023-11-14 DIAGNOSIS — Z66 Do not resuscitate: Secondary | ICD-10-CM | POA: Diagnosis present

## 2023-11-14 DIAGNOSIS — J449 Chronic obstructive pulmonary disease, unspecified: Secondary | ICD-10-CM | POA: Diagnosis present

## 2023-11-14 DIAGNOSIS — R531 Weakness: Secondary | ICD-10-CM | POA: Diagnosis present

## 2023-11-14 DIAGNOSIS — D72829 Elevated white blood cell count, unspecified: Secondary | ICD-10-CM | POA: Diagnosis present

## 2023-11-14 DIAGNOSIS — D61818 Other pancytopenia: Secondary | ICD-10-CM | POA: Diagnosis present

## 2023-11-14 DIAGNOSIS — C7931 Secondary malignant neoplasm of brain: Secondary | ICD-10-CM | POA: Diagnosis present

## 2023-11-14 DIAGNOSIS — Z923 Personal history of irradiation: Secondary | ICD-10-CM | POA: Diagnosis not present

## 2023-11-14 DIAGNOSIS — E876 Hypokalemia: Secondary | ICD-10-CM | POA: Diagnosis present

## 2023-11-14 LAB — GLUCOSE, CAPILLARY
Glucose-Capillary: 129 mg/dL — ABNORMAL HIGH (ref 70–99)
Glucose-Capillary: 119 mg/dL — ABNORMAL HIGH (ref 70–99)

## 2023-11-14 LAB — BASIC METABOLIC PANEL
Anion gap: 10 (ref 5–15)
Anion gap: 8 (ref 5–15)
BUN: 16 mg/dL (ref 8–23)
BUN: 16 mg/dL (ref 8–23)
CO2: 30 mmol/L (ref 22–32)
CO2: 27 mmol/L (ref 22–32)
Calcium: 7.6 mg/dL — ABNORMAL LOW (ref 8.9–10.3)
Calcium: 7.6 mg/dL — ABNORMAL LOW (ref 8.9–10.3)
Chloride: 101 mmol/L (ref 98–111)
Chloride: 103 mmol/L (ref 98–111)
Creatinine, Ser: 0.85 mg/dL (ref 0.44–1.00)
Creatinine, Ser: 0.82 mg/dL (ref 0.44–1.00)
GFR, Estimated: >60 mL/min (ref >60)
GFR, Estimated: >60 mL/min (ref >60)
Glucose, Bld: 64 mg/dL — ABNORMAL LOW (ref 70–99)
Glucose, Bld: 128 mg/dL — ABNORMAL HIGH (ref 70–99)
Potassium: 2.9 mmol/L — ABNORMAL LOW (ref 3.5–5.1)
Potassium: 4.1 mmol/L (ref 3.5–5.1)
Sodium: 141 mmol/L (ref 135–145)
Sodium: 138 mmol/L (ref 135–145)

## 2023-11-14 LAB — CBC
HCT: 27.3 % — ABNORMAL LOW (ref 36.0–46.0)
Hemoglobin: 8.8 g/dL — ABNORMAL LOW (ref 12.0–15.0)
MCH: 28.5 pg (ref 26.0–34.0)
MCHC: 32.2 g/dL (ref 30.0–36.0)
MCV: 88.3 fL (ref 80.0–100.0)
Platelets: 68 10*3/uL — ABNORMAL LOW (ref 150–400)
RBC: 3.09 MIL/uL — ABNORMAL LOW (ref 3.87–5.11)
RDW: 15.4 % (ref 11.5–15.5)
WBC: 23.6 10*3/uL — ABNORMAL HIGH (ref 4.0–10.5)
nRBC: 0.3 % — ABNORMAL HIGH (ref 0.0–0.2)

## 2023-11-14 LAB — PHOSPHORUS
Phosphorus: 2.1 mg/dL — ABNORMAL LOW (ref 2.5–4.6)
Phosphorus: 3.6 mg/dL (ref 2.5–4.6)

## 2023-11-14 LAB — MAGNESIUM
Magnesium: 1.1 mg/dL — ABNORMAL LOW (ref 1.7–2.4)
Magnesium: 2 mg/dL (ref 1.7–2.4)

## 2023-11-14 MED ORDER — MAGNESIUM SULFATE 2 GM/50ML IV SOLN
2.0000 g | Freq: Once | INTRAVENOUS | Status: AC
  Administered 2023-11-14: 2 g via INTRAVENOUS
  Filled 2023-11-14: qty 50

## 2023-11-14 MED ORDER — SERTRALINE HCL 50 MG PO TABS
50.0000 mg | ORAL_TABLET | Freq: Every day | ORAL | Status: DC
  Administered 2023-11-14 – 2023-11-15 (×2): 50 mg via ORAL
  Filled 2023-11-14 (×2): qty 1

## 2023-11-14 MED ORDER — LEVETIRACETAM 500 MG PO TABS
750.0000 mg | ORAL_TABLET | Freq: Two times a day (BID) | ORAL | Status: DC
  Administered 2023-11-14 – 2023-11-15 (×3): 750 mg via ORAL
  Filled 2023-11-14 (×4): qty 1

## 2023-11-14 MED ORDER — POTASSIUM PHOSPHATES 15 MMOLE/5ML IV SOLN
30.0000 mmol | Freq: Once | INTRAVENOUS | Status: DC
  Filled 2023-11-14: qty 10

## 2023-11-14 MED ORDER — ATORVASTATIN CALCIUM 10 MG PO TABS
20.0000 mg | ORAL_TABLET | Freq: Every day | ORAL | Status: DC
  Administered 2023-11-14 – 2023-11-15 (×2): 20 mg via ORAL
  Filled 2023-11-14 (×2): qty 2

## 2023-11-14 MED ORDER — POTASSIUM PHOSPHATES 15 MMOLE/5ML IV SOLN
30.0000 mmol | Freq: Once | INTRAVENOUS | Status: AC
  Administered 2023-11-14: 30 mmol via INTRAVENOUS
  Filled 2023-11-14: qty 10

## 2023-11-14 MED ORDER — METOPROLOL TARTRATE 12.5 MG HALF TABLET
12.5000 mg | ORAL_TABLET | Freq: Every day | ORAL | Status: DC
  Administered 2023-11-14 – 2023-11-15 (×2): 12.5 mg via ORAL
  Filled 2023-11-14 (×2): qty 1

## 2023-11-14 MED ORDER — POTASSIUM CHLORIDE CRYS ER 20 MEQ PO TBCR
40.0000 meq | EXTENDED_RELEASE_TABLET | ORAL | Status: AC
  Administered 2023-11-14 (×2): 40 meq via ORAL
  Filled 2023-11-14 (×2): qty 2

## 2023-11-14 MED ORDER — DEXAMETHASONE 4 MG PO TABS
4.0000 mg | ORAL_TABLET | Freq: Two times a day (BID) | ORAL | Status: DC
  Administered 2023-11-14 – 2023-11-15 (×3): 4 mg via ORAL
  Filled 2023-11-14 (×3): qty 1

## 2023-11-14 NOTE — Hospital Course (Signed)
 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  Was admitted for generalized bodyaches, hypokalemia.  12/27 discharged after 6-day hospitalization for sepsis secondary to pneumonia, treated with 7 days of IV antibiotics.  Chronic issues included COPD and stage IV non-small cell lung cancer, pancytopenia.  Did receive PRBCs and IV Granix  as well as platelets.  Consultants None  Procedures/Events None

## 2023-11-14 NOTE — Evaluation (Signed)
 Occupational Therapy Evaluation Patient Details Name: [name] MRN: [id number] DOB: [birth year] Today's Date: 11/14/2023   History of Present Illness 63 yr old female who presented 11/13/23 with complaints of generalized bodyaches. Dx of hypokalemia. 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   Clinical Impression   The pt is currently limited by the below listed deficits, which compromise her ADL performance and overall functional independence (see OT problem list). During the session today, she was noted to be with moderate lethargy; as such, she required intermittent cues to maintain arousal. Due to this, attempts were deferred at out of bed activity and the eval was completed with the pt at bed level. The pt reported feelings of generalized weakness, as compared to her baseline. She stated home therapy had recently been set to start, prior to her hospitalization and she desires to return home with such services upon hospital discharge. OT will follow her for further services in the acute care setting to maximize her safety and independence with self-care tasks and to decrease the risk for further weakness and deconditioning.        If plan is discharge home, recommend the following: A little help with walking and/or transfers;A little help with bathing/dressing/bathroom;Assistance with cooking/housework;Help with stairs or ramp for entrance;Assist for transportation    Functional Status Assessment  Patient has had a recent decline in their functional status and demonstrates the ability to make significant improvements in function in a reasonable and predictable amount of time.  Equipment Recommendations  Other (comment) (defer to next level of care)    Recommendations for Other  Services       Precautions / Restrictions Restrictions Weight Bearing Restrictions Per Provider Order: No Other Position/Activity Restrictions: Uses 2L O2 at baseline      Mobility Bed Mobility Overal bed mobility: Needs Assistance Bed Mobility: Rolling Rolling: Contact guard assist         General bed mobility comments: She further required min assist to scoot to the head of the bed, with the bed in the gravity eliminated position    Transfers      General transfer comment: deferred today, as pt with increased lethargy          ADL either performed or assessed with clinical judgement   ADL Overall ADL's : Needs assistance/impaired Eating/Feeding: Set up;Bed level Eating/Feeding Details (indicate cue type and reason): based on clinical judgement Grooming: Set up;Bed level Grooming Details (indicate cue type and reason): based on clinical judgement         Upper Body Dressing : Minimal assistance;Bed level   Lower Body Dressing: Moderate assistance;Bed level                                    Pertinent Vitals/Pain Pain Assessment Pain Assessment: No/denies pain     Extremity/Trunk Assessment Upper Extremity Assessment Upper Extremity Assessment: Right hand dominant;Generalized weakness (B UE AROM WFL. B UE grip strength 4-/5)   Lower Extremity Assessment Lower Extremity Assessment: Generalized weakness (B LE AROM WFL)       Communication Communication Communication: No apparent difficulties   Cognition Arousal: Alert Behavior During Therapy: WFL for tasks assessed/performed Overall Cognitive Status:  (Difficult to assess in-depth, due to pt with lethargy)  General Comments: Oriented to person and place. Disoriented to time                Home Living Family/patient expects to be discharged to:: Private residence Living Arrangements: Other (Comment) (Grandson)   Type of Home: Other(Comment) (Condo)       Home Layout:  One level     Bathroom Shower/Tub: Tub/shower unit         Home Equipment: Rollator (4 wheels);Shower seat   Additional Comments: Oxygen       Prior Functioning/Environment      Mobility Comments:  (She reported occasional use of a rollator.) ADLs Comments:  (She reported being modified independent to independent with ADLs and her family managed the cooking and cleaning.)        OT Problem List: Decreased strength;Impaired balance (sitting and/or standing);Decreased activity tolerance      OT Treatment/Interventions: Self-care/ADL training;Therapeutic exercise;Energy conservation;DME and/or AE instruction;Balance training;Patient/family education;Therapeutic activities    OT Goals(Current goals can be found in the care plan section) Acute Rehab OT Goals Patient Stated Goal: to return home with home therapy OT Goal Formulation: With patient Time For Goal Achievement: 11/28/23 Potential to Achieve Goals: Good  OT Frequency: Min 1X/week       AM-PAC OT 6 Clicks Daily Activity     Outcome Measure Help from another person eating meals?: A Little Help from another person taking care of personal grooming?: A Little Help from another person toileting, which includes using toliet, bedpan, or urinal?: A Little Help from another person bathing (including washing, rinsing, drying)?: A Lot Help from another person to put on and taking off regular upper body clothing?: A Little Help from another person to put on and taking off regular lower body clothing?: A Lot 6 Click Score: 16   End of Session Equipment Utilized During Treatment: Oxygen   Activity Tolerance: Other (comment) (Pt limited by lethargy) Patient left: in bed;with nursing/sitter in room (nurse tech present in room)  OT Visit Diagnosis: Muscle weakness (generalized) (M62.81)                Time: 8376-8361 OT Time Calculation (min): 15 min Charges:  OT General Charges $OT Visit: 1 Visit OT Evaluation $OT Eval  Moderate Complexity: 1 Mod  Estephan Gallardo L Nikola Blackston, OTR/L 11/14/2023, 4:47 PM

## 2023-11-14 NOTE — Progress Notes (Signed)
 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  Was admitted for generalized bodyaches, hypokalemia.  12/27 discharged after 6-day hospitalization for sepsis secondary to pneumonia, treated with 7 days of IV antibiotics.  Chronic issues included COPD and stage IV non-small cell lung cancer, pancytopenia.  Did receive PRBCs and IV Granix  as well as platelets.  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   Now hungry.  Encourage oral intake.    Hypokalemia Hypophosphatemia Hypomagnesemia Failure to thrive Serum potassium 2.5 on presentation.  Improved but still below 3.  Continue repletion. Replete magnesium  and phosphorus Check levels this evening and again in the morning.   Elevated troponin, flat Demand ischemia was considered but at this point is ruled out.  Troponins are mildly elevated also seen 10 days prior.  Trivial in nature.  No symptoms reported.  EKG nonacute.  No further evaluation suggested.   QTc prolongation Poor quality EKG.  Repeat study when electrolytes are repleted.   Generalized weakness Home health physical therapy.   Anemia of chronic disease Stable.  Follow-up as an outpatient.   Leukocytosis secondary to recent Granix    Thrombocytopenia Platelet count 68, stable.   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 Gatha medical oncology   Chronic  anxiety/depression Continue Zoloft .   Hypertension Continue Lopressor    COPD Chronic hypoxia Stable No acute issues..       Subjective:  Doesn't feel good No vomiting here Hungry Was not eating well at home  Physical Exam: Vitals:   11/14/23 0230 11/14/23 0545 11/14/23 0900 11/14/23 1006  BP: 132/66 129/74 117/70   Pulse: (!) 108 (!) 120 89   Resp: 18 16 18    Temp:  98.8 F (37.1 C)  98.9 F (37.2 C)  TempSrc:  Oral    SpO2: 92% 90% 100%    Physical Exam Vitals reviewed.  Constitutional:      General: She is not in acute distress.    Appearance: She is not ill-appearing or toxic-appearing.  Cardiovascular:     Rate and Rhythm: Normal rate and regular rhythm.     Heart sounds: No murmur heard. Pulmonary:     Effort: Pulmonary effort is normal. No respiratory distress.     Breath sounds: No wheezing, rhonchi or rales.  Abdominal:     General: There is no distension.     Palpations: Abdomen is soft.     Tenderness: There is no abdominal tenderness.  Neurological:     Mental Status: She is alert.  Psychiatric:        Mood and Affect: Mood normal.        Behavior: Behavior normal.     Data Reviewed: Potassium 2.5 > 2.9 Phosphorus 2.1 Magnesium  1.1 WBC 23.6 stable Hemoglobin stable 8.8 Platelets improved, 68  Family Communication: daughter at bedside  Disposition: Status is: Observation   Planned Discharge Destination: Home with Home Health PT    Time spent: 25 minutes  Author:  Toribio Door, MD 11/14/2023 11:54 AM  For on call review [email or web address].

## 2023-11-14 NOTE — Evaluation (Signed)
 Physical Therapy Evaluation Patient Details Name: [name] MRN: [id number] DOB: 10/19/1961 Today's Date: 11/14/2023  History of Present Illness  63 y.o. female who presented 11/13/23 with complaints of generalized bodyaches. Dx of hypokalemia. 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  Clinical Impression  Pt admitted with above diagnosis. Min assist for bed mobility, pt found to be lying in stool, min to contact guard assist for step pivot transfer to bedside commode with RW. After standing with RW for ~1 minute for pericare, she reported BLE fatigue so ambulation was deferred and she was assisted back to bed. Pt reports having 24* assistance available at home. Good progress expected.  Pt currently with functional limitations due to the deficits listed below (see PT Problem List). Pt will benefit from acute skilled PT to increase their independence and safety with mobility to allow discharge.           If plan is discharge home, recommend the following: A little help with walking and/or transfers;Assist for transportation;Help with stairs or ramp for entrance;A little help with bathing/dressing/bathroom   Can travel by private vehicle        Equipment Recommendations None recommended by PT  Recommendations for Other Services       Functional Status Assessment Patient has had a recent decline in their functional status and demonstrates the ability to make significant improvements in function in a reasonable and predictable amount of time.     Precautions / Restrictions Precautions Precautions: Fall Precaution Comments: 2L O2 baseline; pt reports h/o 1 fall in past 6 months Restrictions Weight Bearing Restrictions Per Provider Order: No      Mobility  Bed Mobility Overal  bed mobility: Needs Assistance Bed Mobility: Supine to Sit, Sit to Supine     Supine to sit: Min assist Sit to supine: Min assist   General bed mobility comments: assist to raise trunk, assist for BLEs into bed    Transfers Overall transfer level: Needs assistance Equipment used: Rolling walker (2 wheels) Transfers: Sit to/from Stand, Bed to chair/wheelchair/BSC Sit to Stand: Min assist   Step pivot transfers: Contact guard assist       General transfer comment: min A to steady and power up, step pivot with RW from ED bed to bedside commode. Pt was found to be lying in stool. Pt able to stand for ~1 minute with RW for pericare, then reported her legs were fatigued so assisted back to bed. Pt on 2L O2 during session, pulse oximeter did not give a reading.    Ambulation/Gait               General Gait Details: deferred 2* fatigue with transfer  Stairs            Wheelchair Mobility     Tilt Bed    Modified Rankin (Stroke Patients Only)       Balance Overall balance assessment: Needs assistance Sitting-balance support: No upper extremity supported, Feet supported Sitting balance-Leahy Scale: Good     Standing balance support: During functional activity, Bilateral upper extremity supported Standing balance-Leahy Scale: Fair                               Pertinent Vitals/Pain Pain Assessment Pain Assessment: No/denies pain  Home Living Family/patient expects to be discharged to:: Private residence Living Arrangements: Children Available Help at Discharge: Family Type of Home: Apartment Home Access: Stairs to enter Entrance Stairs-Rails: Can reach both Entrance Stairs-Number of Steps: 7   Home Layout: One level Home Equipment: Rollator (4 wheels);Shower seat;Wheelchair - manual;Hospital bed Additional Comments: on 2L at home. Pt reports son or grandson are home 24/7    Prior Function Prior Level of Function : Needs assist              Mobility Comments: PRN use of w/c and rollator ADLs Comments: ind     Extremity/Trunk Assessment   Upper Extremity Assessment Upper Extremity Assessment: Generalized weakness    Lower Extremity Assessment Lower Extremity Assessment: Generalized weakness    Cervical / Trunk Assessment Cervical / Trunk Assessment: Normal  Communication   Communication Communication: No apparent difficulties  Cognition Arousal: Alert Behavior During Therapy: WFL for tasks assessed/performed Overall Cognitive Status: Within Functional Limits for tasks assessed                                          General Comments      Exercises     Assessment/Plan    PT Assessment Patient needs continued PT services  PT Problem List Decreased strength;Decreased activity tolerance;Decreased balance;Decreased mobility;Decreased knowledge of use of DME       PT Treatment Interventions DME instruction;Gait training;Functional mobility training;Therapeutic activities;Therapeutic exercise;Patient/family education;Balance training;Cognitive remediation    PT Goals (Current goals can be found in the Care Plan section)  Acute Rehab PT Goals Patient Stated Goal: to go home. Pt reported nothing when asked what she likes to do when she's feeling well. PT Goal Formulation: With patient Time For Goal Achievement: 11/28/23 Potential to Achieve Goals: Good    Frequency Min 1X/week     Co-evaluation               AM-PAC PT 6 Clicks Mobility  Outcome Measure Help needed turning from your back to your side while in a flat bed without using bedrails?: None Help needed moving from lying on your back to sitting on the side of a flat bed without using bedrails?: A Little Help needed moving to and from a bed to a chair (including a wheelchair)?: A Little Help needed standing up from a chair using your arms (e.g., wheelchair or bedside chair)?: A Little Help needed to walk in  hospital room?: A Lot Help needed climbing 3-5 steps with a railing? : A Lot 6 Click Score: 17    End of Session Equipment Utilized During Treatment: Oxygen ;Gait belt Activity Tolerance: Patient limited by fatigue Patient left: in bed;with call bell/phone within reach Nurse Communication: Mobility status PT Visit Diagnosis: Muscle weakness (generalized) (M62.81);Difficulty in walking, not elsewhere classified (R26.2)    Time: 9076-9051 PT Time Calculation (min) (ACUTE ONLY): 25 min   Charges:   PT Evaluation $PT Eval Moderate Complexity: 1 Mod PT Treatments $Therapeutic Activity: 8-22 mins PT General Charges $$ ACUTE PT VISIT: 1 Visit        Sylvan Delon Copp PT 11/14/2023  Acute Rehabilitation Services  Office [phone]

## 2023-11-14 NOTE — Plan of Care (Signed)

## 2023-11-15 ENCOUNTER — Inpatient Hospital Stay: Payer: 59

## 2023-11-15 ENCOUNTER — Inpatient Hospital Stay: Payer: 59 | Admitting: Internal Medicine

## 2023-11-15 DIAGNOSIS — E876 Hypokalemia: Secondary | ICD-10-CM

## 2023-11-15 DIAGNOSIS — D696 Thrombocytopenia, unspecified: Secondary | ICD-10-CM | POA: Diagnosis not present

## 2023-11-15 DIAGNOSIS — R627 Adult failure to thrive: Secondary | ICD-10-CM | POA: Diagnosis not present

## 2023-11-15 DIAGNOSIS — R52 Pain, unspecified: Secondary | ICD-10-CM | POA: Diagnosis not present

## 2023-11-15 LAB — BASIC METABOLIC PANEL
Anion gap: 9 (ref 5–15)
BUN: 19 mg/dL (ref 8–23)
CO2: 28 mmol/L (ref 22–32)
Calcium: 7.5 mg/dL — ABNORMAL LOW (ref 8.9–10.3)
Chloride: 100 mmol/L (ref 98–111)
Creatinine, Ser: 0.86 mg/dL (ref 0.44–1.00)
GFR, Estimated: >60 mL/min (ref >60)
Glucose, Bld: 91 mg/dL (ref 70–99)
Potassium: 4 mmol/L (ref 3.5–5.1)
Sodium: 137 mmol/L (ref 135–145)

## 2023-11-15 LAB — MAGNESIUM: Magnesium: 1.8 mg/dL (ref 1.7–2.4)

## 2023-11-15 LAB — PHOSPHORUS: Phosphorus: 3.9 mg/dL (ref 2.5–4.6)

## 2023-11-15 MED FILL — Fosaprepitant Dimeglumine For IV Infusion 150 MG (Base Eq): INTRAVENOUS | Qty: 5 | Status: AC

## 2023-11-15 NOTE — TOC Initial Note (Addendum)
 Transition of Care [facility]) - Initial/Assessment Note    Patient Details  Name: [name] MRN: [id number] Date of Birth: [birth year]  Transition of Care [facility]) CM/SW Contact:    Sonda Manuella Quill, RN Phone Number: 11/15/2023, 12:24 PM  Clinical Narrative:                 TOC for d/c planning; spoke w/ pt in room; pt says she lives at home w/ her grandson; she plans to return at d/c; pt identified POC dtr Cherree Conerly [phone]);  pt says she will arrange transportation; she verified insurance/PCP; pt denies SDOH risks; pt has shower chair and HH services w/ [facility]; pt says she would like to con't these services; [place] at agency is aware of hospital admit; pt says her home oxygen  is w/ [employer]; pt says she has a full travel tank; TOC will follow  -1713- returned call to [facility] from [facility]; LVM at [phone]; awaiting return call. Expected Discharge Plan: Home w Home Health Services Barriers to Discharge: Continued Medical Work up   Patient Goals and CMS Choice Patient states their goals for this hospitalization and ongoing recovery are:: home CMS Medicare.gov Compare Post Acute Care list provided to:: Patient Choice offered to / list presented to : Patient [place] ownership interest in [facility].provided to:: Patient    Expected Discharge Plan and Services   Discharge Planning Services: CM Consult Post Acute Care Choice: Resumption of Svcs/PTA Provider Living arrangements for the past 2 months: Single Family Home                                      Prior Living Arrangements/Services Living arrangements for the past 2 months: Single Family Home Lives with:: Relatives Patient language and need for interpreter reviewed:: Yes Do you feel safe going back to the place where you live?: Yes      Need for Family Participation in Patient Care: Yes (Comment) Care giver support system in place?: Yes (comment) Current home services: DME (home  oxygen ; shower chair) Criminal Activity/Legal Involvement Pertinent to Current Situation/Hospitalization: No - Comment as needed  Activities of Daily Living   ADL Screening (condition at time of admission) Independently performs ADLs?: Yes (appropriate for developmental age) Is the patient deaf or have difficulty hearing?: No Does the patient have difficulty seeing, even when wearing glasses/contacts?: No Does the patient have difficulty concentrating, remembering, or making decisions?: No  Permission Sought/Granted Permission sought to share information with : Case Manager Permission granted to share information with : Yes, Verbal Permission Granted  Share Information with NAME: Case Manager     Permission granted to share info w Relationship: [name] (dtr) [phone]     Emotional Assessment Appearance:: Appears stated age Attitude/Demeanor/Rapport: Gracious Affect (typically observed): Accepting Orientation: : Oriented to Self, Oriented to Place, Oriented to  Time, Oriented to Situation Alcohol / Substance Use: Not Applicable Psych Involvement: No (comment)  Admission diagnosis:  Hypokalemia [E87.6] Weakness [R53.1] Generalized body aches [R52] Patient Active Problem List   Diagnosis Date Noted   FTT (failure to thrive) in adult 11/14/2023   QT prolongation 11/14/2023   Generalized body aches 11/13/2023   Sepsis due to pneumonia (HCC) 11/03/2023   Weakness 10/13/2023   Thrombocytopenia (HCC) 10/13/2023   Malignant neoplasm of right lung (HCC) 10/13/2023   Sepsis (HCC) 10/13/2023   Fever and neutropenia (HCC) 10/12/2023   Goals  of care, counseling/discussion 09/27/2023   Acute metabolic encephalopathy 09/17/2023   Cerebral edema (HCC) 08/31/2023   Acute metabolic encephalopathy 08/28/2023   Community acquired pneumonia of right upper lobe of lung 08/28/2023   AKI (acute kidney injury) (HCC) 08/27/2023   Lung cancer metastatic to brain (HCC) 08/08/2023    Port-A-Cath in place 08/07/2023   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) 07/24/2023   Squamous cell lung cancer, 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   Frontal mass of brain 06/26/2023   Mass of upper lobe of right lung 06/26/2023   DNR (do not resuscitate) 06/26/2023   Cirrhosis (HCC) 03/14/2022   Benign neoplasm of ascending colon    Anemia 01/10/2019   Chronic cough 11/26/2017   Depression    Obesity (BMI 30-39.9) 01/10/2017   Dyspnea on exertion 11/09/2016   Chronic respiratory failure with hypoxia and hypercapnia (HCC) 10/29/2016   Depression with anxiety 08/07/2016   Hot flashes 02/01/2016   Hypokalemia    Breast cancer of upper-outer quadrant of right female breast (HCC) 08/04/2015   GERD (gastroesophageal reflux disease) 12/21/2012   Hyperlipidemia 12/21/2012   Solitary pulmonary nodule on lung CT 12/21/2012   SICKLE-CELL TRAIT 04/15/2008   Sarcoidosis (HCC) 01/10/2007   Hypertension 01/10/2007   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  01/10/2007   BACK PAIN, LOW 01/10/2007   PCP:  Claudene Prentice [name]., FNP Pharmacy:   [id number] [address] ([address]), [place] - [address] [place] ([place]) [place] [address] Phone: [phone] Fax: [phone]  [facility] - [place], [place] - [address] [place] [address] Phone: [phone] Fax: [phone]     Social Drivers of Health (SDOH) Social History: SDOH Screenings   Food Insecurity: No Food Insecurity (11/15/2023)  Housing: Low Risk  (11/15/2023)  Transportation Needs: No Transportation Needs (11/15/2023)  Utilities: Not At Risk (11/15/2023)  Alcohol Screen: Low Risk  (07/23/2023)  Depression (PHQ2-9): Low Risk  (07/23/2023)  Tobacco Use: Medium Risk (11/13/2023)   SDOH Interventions: Food Insecurity Interventions: Intervention Not Indicated, Inpatient TOC Housing Interventions: Intervention Not Indicated, Inpatient  TOC Transportation Interventions: Intervention Not Indicated, Inpatient TOC Utilities Interventions: Intervention Not Indicated, Inpatient TOC   Readmission Risk Interventions    11/15/2023   12:18 PM 11/15/2023   10:31 AM 10/18/2023   12:35 PM  Readmission Risk Prevention Plan  Transportation Screening Complete Complete Complete  Medication Review Oceanographer) Complete Complete Complete  PCP or Specialist appointment within 3-5 days of discharge Complete Complete Complete  HRI or Home Care Consult Complete Complete Complete  SW Recovery Care/Counseling Consult Complete Complete Complete  Palliative Care Screening Not Applicable Complete Complete  Skilled Nursing Facility Not Applicable Not Applicable Not Applicable

## 2023-11-15 NOTE — TOC Transition Note (Signed)
 Transition of Care [facility]) - Discharge Note   Patient Details  Name: [name] MRN: [id number] Date of Birth: [birth year]  Transition of Care [facility]) CM/SW Contact:  Sonda Manuella Quill, RN Phone Number: 11/15/2023, 5:16 PM   Clinical Narrative:    D/C orders received; no TOC needs.   Final next level of care: Home w Home Health Services Barriers to Discharge: No Barriers Identified   Patient Goals and CMS Choice Patient states their goals for this hospitalization and ongoing recovery are:: home CMS Medicare.gov Compare Post Acute Care list provided to:: Patient Choice offered to / list presented to : Patient [place] ownership interest in [facility].provided to:: Patient    Discharge Placement                       Discharge Plan and Services Additional resources added to the After Visit Summary for     Discharge Planning Services: CM Consult Post Acute Care Choice: Resumption of Svcs/PTA Provider                    HH Arranged: RN, PT, OT [facility] Agency: [facility] Date [facility] Agency Contacted: 11/15/23 Time HH Agency Contacted: [phone] Representative spoke with at [facility] Agency: [place]  Social Drivers of Health (SDOH) Interventions SDOH Screenings   Food Insecurity: No Food Insecurity (11/15/2023)  Housing: Low Risk  (11/15/2023)  Transportation Needs: No Transportation Needs (11/15/2023)  Utilities: Not At Risk (11/15/2023)  Alcohol Screen: Low Risk  (07/23/2023)  Depression (PHQ2-9): Low Risk  (07/23/2023)  Tobacco Use: Medium Risk (11/13/2023)     Readmission Risk Interventions    11/15/2023   12:18 PM 11/15/2023   10:31 AM 10/18/2023   12:35 PM  Readmission Risk Prevention Plan  Transportation Screening Complete Complete Complete  Medication Review [employer]) Complete Complete Complete  PCP or Specialist appointment within 3-5 days of discharge Complete Complete Complete  HRI or Home Care Consult Complete Complete Complete  SW Recovery  Care/Counseling Consult Complete Complete Complete  Palliative Care Screening Not Applicable Complete Complete  Skilled Nursing Facility Not Applicable Not Applicable Not Applicable

## 2023-11-15 NOTE — Discharge Summary (Signed)
 Physician Discharge Summary   Patient: [name] MRN: [id number] DOB: 10/27/1961  Admit date:     11/13/2023  Discharge date: 11/15/23  Discharge Physician: Toribio Door   PCP: Claudene Prentice [name]., FNP   Recommendations at discharge:   Stopped triamterene  hydrochlorothiazide  given clinical presentation as described below.  Blood pressure stable currently.  May need to consider alternative agent if blood pressure increases.  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  Was admitted for generalized bodyaches, hypokalemia.  Electrolytes were repleted and patient improved clinically.  Discharged home in stable condition.  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   Improved.   Hypokalemia Hypophosphatemia Hypomagnesemia Failure to thrive Serum potassium 2.5 on presentation. Now WNL Magnesium  and phosphorus repleted total   Elevated troponin, flat Demand ischemia was considered but at this point is ruled out.  Troponins are mildly elevated also seen 10 days prior.  Trivial in nature.  No symptoms reported.  EKG nonacute.  No further evaluation suggested.   QTc prolongation Resolved on repeat EKG 1/2.   Generalized weakness Home health  physical therapy.   Anemia of chronic disease Stable.  Follow-up as an outpatient.   Leukocytosis secondary to recent Granix    Thrombocytopenia Platelet count 68, stable.   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 Gatha medical oncology   Chronic anxiety/depression Continue Zoloft .   Hypertension Continue Lopressor    COPD Chronic hypoxia Stable No acute issues..  Disposition: Home Diet recommendation:  Diet Orders (From admission, onward)     Start     Ordered   11/15/23 0000  Diet - low sodium heart healthy        11/15/23 1547   11/13/23 2225  Diet Heart Room service appropriate? Yes; Fluid consistency: Thin  Diet effective now       Question Answer Comment  Room service appropriate? Yes   Fluid consistency: Thin      11/13/23 2224            DISCHARGE MEDICATION: Allergies as of 11/15/2023       Reactions   Codeine Other (See Comments)   Avoids-felt bad when taken before Pt claims she can have this but does not like to take        Medication List     STOP taking these medications    triamterene -hydrochlorothiazide  37.5-25 MG tablet Commonly known as: MAXZIDE -25       TAKE these medications    acetaminophen  325 MG tablet Commonly known as: TYLENOL  Take 650 mg by mouth every 6 (six) hours as needed for mild pain or moderate pain.   albuterol  108 (90 Base) MCG/ACT inhaler Commonly known as: VENTOLIN  HFA INHALE 2 PUFFS EVERY 6 HOURS AS NEEDED FOR WHEEZING OR SHORTNESS OF  BREATH   aspirin  81 MG chewable tablet Chew 1 tablet (81 mg total) by mouth daily.   atorvastatin  20 MG tablet Commonly known as: LIPITOR TAKE 1 TABLET (20 MG TOTAL) BY MOUTH DAILY.   dexamethasone  4 MG tablet Commonly known as: DECADRON  Take 1 tablet (4 mg total) by mouth 2 (two) times daily.   fluticasone  50 MCG/ACT nasal spray Commonly known as: FLONASE  USE 2 SPRAYS IN BOTH  NOSTRILS DAILY AS  NEEDED  FOR ALLERGIES   HYDROcodone -acetaminophen  5-325 MG tablet Commonly known as: Norco Take 1 tablet by mouth every 6 (six) hours as needed for moderate pain (pain score 4-6).   levETIRAcetam  750 MG tablet Commonly known as: KEPPRA  Take 1 tablet (750 mg total) by mouth 2 (two) times daily.   lidocaine -prilocaine  cream Commonly known as: EMLA  Apply to affected area once   MAGnesium -Oxide 400 (240 Mg) MG tablet Generic drug: magnesium  oxide Take 1 tablet by mouth every morning.   metoprolol  tartrate 25 MG tablet Commonly known as: LOPRESSOR  Take 1 tablet (25 mg total) by mouth daily.   montelukast  10 MG tablet Commonly known as: SINGULAIR  Take 10 mg by mouth at bedtime.   multivitamin capsule Take 1 capsule by mouth daily with breakfast.   omeprazole  20 MG capsule Commonly known as: PRILOSEC TAKE 1 CAPSULE EVERY DAY What changed: when to take this   ondansetron  4 MG tablet Commonly known as: ZOFRAN  Take 1 tablet (4 mg total) by mouth every 6 (six) hours as needed for nausea.   OXYGEN  Inhale 2-3 L/min into the lungs See admin instructions. Inhale 2-3 L/min into the lungs CONTINUOUSLY; 2 L/min at rest and 3 L/min if exerted   potassium chloride  SA 20 MEQ tablet Commonly known as: KLOR-CON  M Take 1 tablet (20 mEq total) by mouth daily for 14 days.   prochlorperazine  10 MG tablet Commonly known as: COMPAZINE  Take 1 tablet (10 mg total) by mouth every 6 (six) hours as needed for nausea or vomiting.   sertraline  50 MG tablet Commonly known as: ZOLOFT  TAKE 1 TABLET EVERY DAY   Stiolto Respimat  2.5-2.5 MCG/ACT Aers Generic drug: Tiotropium Bromide -Olodaterol USE 2 INHALATIONS BY MOUTH DAILY        Follow-up Information     [facility].   Specialty: [facility] Contact information: [address] [place] [place] [address] [phone]         Claudene Prentice [name]., FNP. Schedule an appointment as soon as possible for a  visit in 1 week(s).   Specialty: Family Medicine Contact information: [address] [place] [address] [phone]                Feels better  Discharge Exam: Physical Exam Vitals reviewed.  Constitutional:      General: She is not in acute distress.    Appearance: She is not ill-appearing.  Cardiovascular:     Rate and Rhythm: Normal rate and regular rhythm.     Heart sounds: No murmur heard. Pulmonary:     Effort: Pulmonary effort is normal. No respiratory distress.     Breath sounds: No wheezing, rhonchi or rales.  Neurological:     Mental Status: She is alert.  Psychiatric:        Mood and Affect: Mood normal.        Behavior: Behavior normal.      Condition at discharge: good  The results of significant diagnostics from this hospitalization (including imaging, microbiology, ancillary  and laboratory) are listed below for reference.   Imaging Studies: DG Chest Port 1 View Result Date: 11/13/2023 CLINICAL DATA:  Weakness.  History of lung cancer. EXAM: PORTABLE CHEST 1 VIEW COMPARISON:  11/03/2023 radiograph and CT FINDINGS: Right chest port remains in place. Stable radiographic appearance of right lung volume loss, apical opacity and streaky right midlung opacity. The left upper lobe opacity on CT is not well seen by radiograph. Stable heart size and mediastinal contours. Slight blunting of right costophrenic angle with small effusion on CT. No pneumothorax. No pulmonary edema. IMPRESSION: 1. Stable radiographic appearance of the chest. Right lung findings consistent with known neoplasm. The left upper lobe opacity on prior CT is not well demonstrated by radiograph. 2. Small right pleural effusion, unchanged. Electronically Signed   By: Andrea Gasman M.D.   On: 11/13/2023 20:01   VAS US  UPPER EXTREMITY VENOUS DUPLEX Result Date: 11/04/2023 UPPER VENOUS STUDY  Patient Name:  TYKISHA AREOLA  Date of Exam:   11/04/2023 Medical Rec #: [id number]       Accession  #:    [id number] Date of Birth: 06-Nov-1961        Patient Gender: F Patient Age:   32 years Exam Location:  [facility] Procedure:      VAS US  UPPER EXTREMITY VENOUS DUPLEX Referring Phys: PARDEEP KHATRI --------------------------------------------------------------------------------  Indications: Swelling, and Pain Risk Factors: Cancer Breast past pregnancy. Comparison Study: None Performing Technologist: Garnette Rockers  Examination Guidelines: A complete evaluation includes B-mode imaging, spectral Doppler, color Doppler, and power Doppler as needed of all accessible portions of each vessel. Bilateral testing is considered an integral part of a complete examination. Limited examinations for reoccurring indications may be performed as noted.  Right Findings: +----------+------------+---------+-----------+----------+-------+ RIGHT     CompressiblePhasicitySpontaneousPropertiesSummary +----------+------------+---------+-----------+----------+-------+ IJV           Full       Yes       Yes                      +----------+------------+---------+-----------+----------+-------+ Subclavian    Full       Yes       Yes                      +----------+------------+---------+-----------+----------+-------+ Axillary      Full       Yes       Yes                      +----------+------------+---------+-----------+----------+-------+ Brachial      Full       Yes       Yes                      +----------+------------+---------+-----------+----------+-------+ Radial        Full       Yes       Yes                      +----------+------------+---------+-----------+----------+-------+ Ulnar         Full       Yes       Yes                      +----------+------------+---------+-----------+----------+-------+ Cephalic      Full                 Yes                      +----------+------------+---------+-----------+----------+-------+  Basilic       Full                  Yes                      +----------+------------+---------+-----------+----------+-------+  Left Findings: +----------+------------+---------+-----------+----------+-------+ LEFT      CompressiblePhasicitySpontaneousPropertiesSummary +----------+------------+---------+-----------+----------+-------+ Subclavian    Full       Yes       Yes                      +----------+------------+---------+-----------+----------+-------+  Summary:  Right: No evidence of deep vein thrombosis in the upper extremity. No evidence of superficial vein thrombosis in the upper extremity.  Left: No evidence of thrombosis in the subclavian.  *See table(s) above for measurements and observations.  Diagnosing physician: Gaile New MD Electronically signed by Gaile New MD on 11/04/2023 at 7:21:59 PM.    Final    CT Angio Chest/Abd/Pel for Dissection W and/or Wo Contrast Result Date: 11/03/2023 CLINICAL DATA:  Abdominal pain, tachycardia, pulsatile abdominal mass history of lung cancer * Tracking Code: BO * EXAM: CT ANGIOGRAPHY CHEST, ABDOMEN AND PELVIS TECHNIQUE: Non-contrast CT of the chest was initially obtained. Multidetector CT imaging through the chest, abdomen and pelvis was performed using the standard protocol during bolus administration of intravenous contrast. Multiplanar reconstructed images and MIPs were obtained and reviewed to evaluate the vascular anatomy. RADIATION DOSE REDUCTION: This exam was performed according to the departmental dose-optimization program which includes automated exposure control, adjustment of the mA and/or kV according to patient size and/or use of iterative reconstruction technique. CONTRAST:  OMNIPAQUE  IOHEXOL  350 MG/ML SOLN COMPARISON:  CT chest, 09/19/2023, CT abdomen pelvis, 10/13/2023 FINDINGS: CTA CHEST FINDINGS VASCULAR Aorta: Satisfactory opacification of the aorta. Normal contour and caliber of the thoracic aorta. No evidence of aneurysm, dissection, or  other acute aortic pathology. Moderate aortic atherosclerosis Cardiovascular: Right chest port catheter. No evidence of pulmonary embolism on limited non-tailored examination. Normal heart size. Enlargement of the main pulmonary artery measuring up to 3.9 cm in caliber. No pericardial effusion. Review of the MIP images confirms the above findings. NON VASCULAR Mediastinum/Nodes: No enlarged mediastinal, hilar, or axillary lymph nodes. Thyroid  gland, trachea, and esophagus demonstrate no significant findings. Lungs/Pleura: Small right pleural effusion, similar to prior examination. Perhaps slightly diminished size of a cavitary mass of the right upper lobe measuring 4.9 x 3.2 cm, previously 4.8 x 3.6 cm, with somewhat increased cavitation (series 12, image 34). New, dense nodular consolidation in the anterior left upper lobe measuring 2.4 x 1.8 cm (series 12, image 55). Diffuse bilateral bronchial wall thickening. Bandlike scarring of the bilateral lung bases. Musculoskeletal: No chest wall abnormality. No acute osseous findings. Review of the MIP images confirms the above findings. CTA ABDOMEN AND PELVIS FINDINGS VASCULAR Normal contour and caliber of the abdominal aorta. No evidence of aneurysm, dissection, or other acute aortic pathology. Standard branching pattern of the abdominal aorta with solitary bilateral renal arteries. Moderate aortic atherosclerosis. Review of the MIP images confirms the above findings. NON-VASCULAR Hepatobiliary: No significant change in hypodense lesions of the posterior liver, largest measuring 1.9 x 1.5 cm (series 22, image 163). No gallstones, gallbladder wall thickening, or biliary dilatation. Pancreas: Unremarkable. No pancreatic ductal dilatation or surrounding inflammatory changes. Spleen: Normal in size without significant abnormality. Adrenals/Urinary Tract: Adrenal glands are unremarkable. Simple, benign renal cortical cysts, for which no further follow-up or characterization  is required. Kidneys are  otherwise normal, without renal calculi, solid lesion, or hydronephrosis. Bladder is unremarkable. Stomach/Bowel: Stomach is within normal limits. Appendix not clearly visualized and may be surgically absent. No evidence of bowel wall thickening, distention, or inflammatory changes. Lymphatic: No enlarged abdominal or pelvic lymph nodes. Reproductive: Status post hysterectomy Other: No abdominal wall hernia or abnormality. No ascites. Musculoskeletal: No acute osseous findings. IMPRESSION: 1. Normal contour and caliber of the thoracic and abdominal aorta. No evidence of aneurysm, dissection, or other acute aortic pathology. Moderate aortic atherosclerosis. 2. Perhaps slightly diminished size of a cavitary mass of the right upper lobe with somewhat increased cavitation. Findings are consistent with known primary lung malignancy. 3. New, dense nodular consolidation in the anterior left upper lobe measuring 2.4 x 1.8 cm. Findings are most consistent with infection rapid interval development. 4. Small right pleural effusion, similar to prior examination. 5. No significant change in hypodense lesions of the posterior liver, largest measuring 1.9 x 1.5 cm. These remain suspicious for metastases. 6. Enlargement of the main pulmonary artery, as can be seen in pulmonary hypertension. Aortic Atherosclerosis (ICD10-I70.0). Electronically Signed   By: Marolyn JONETTA Jaksch M.D.   On: 11/03/2023 09:28   DG Chest Portable 1 View Result Date: 11/03/2023 CLINICAL DATA:  Sepsis. Abdominal pain for 3 days. History of non-small cell lung cancer. EXAM: PORTABLE CHEST 1 VIEW COMPARISON:  10/12/2023 FINDINGS: Right chest wall porta catheter is noted with tip in the distal SVC. Heart size and mediastinal contours are stable. Unchanged small right pleural effusion. Right apical cavitary lung mass with surrounding pleuroparenchymal scarring is unchanged. Left lung is clear. No acute abnormality. IMPRESSION: 1. No acute  findings. 2. Persistent small right pleural effusion. 3. Unchanged right apical cavitary lung mass with surrounding pleuroparenchymal scarring. Electronically Signed   By: Waddell Calk M.D.   On: 11/03/2023 08:16   DG Lumbar Spine Complete Result Date: 10/20/2023 CLINICAL DATA:  Fall, low back pain EXAM: LUMBAR SPINE - COMPLETE 4+ VIEW COMPARISON:  02/18/2020, CT 10/13/2023 FINDINGS: Normal alignment. No fracture. Early degenerative changes in the lower lumbar spine with slight disc space narrowing and early spurring. Vascular calcifications. No visible aneurysm. IMPRESSION: No acute bony abnormality. Electronically Signed   By: Franky Crease M.D.   On: 10/20/2023 03:34   CT Cervical Spine Wo Contrast Result Date: 10/20/2023 CLINICAL DATA:  Polytrauma, blunt.  Fall. EXAM: CT CERVICAL SPINE WITHOUT CONTRAST TECHNIQUE: Multidetector CT imaging of the cervical spine was performed without intravenous contrast. Multiplanar CT image reconstructions were also generated. RADIATION DOSE REDUCTION: This exam was performed according to the departmental dose-optimization program which includes automated exposure control, adjustment of the mA and/or kV according to patient size and/or use of iterative reconstruction technique. COMPARISON:  None Available. FINDINGS: Alignment: Normal Skull base and vertebrae: No acute fracture. No primary bone lesion or focal pathologic process. Soft tissues and spinal canal: No prevertebral fluid or swelling. No visible canal hematoma. Disc levels: Mild degenerative disc disease in the lower cervical spine. No disc herniation. Upper chest: Again partially visualized is the right upper lobe mass and layering large right pleural effusion. Other: None IMPRESSION: No acute bony abnormality. Known right apical mass and large layering right pleural effusion. Electronically Signed   By: Franky Crease M.D.   On: 10/20/2023 02:08   CT Head Wo Contrast Result Date: 10/20/2023 CLINICAL DATA:   Polytrauma, blunt.  Fall. EXAM: CT HEAD WITHOUT CONTRAST TECHNIQUE: Contiguous axial images were obtained from the base of the skull through the vertex  without intravenous contrast. RADIATION DOSE REDUCTION: This exam was performed according to the departmental dose-optimization program which includes automated exposure control, adjustment of the mA and/or kV according to patient size and/or use of iterative reconstruction technique. COMPARISON:  None Available. FINDINGS: Brain: Again noted are areas of edema within the left temporal lobe and left occipital lobe in areas of prior known metastases. No areas of hemorrhage. No hydrocephalus. No midline shift., Improved since prior study Vascular: No hyperdense vessel or unexpected calcification. Skull: No acute calvarial abnormality. 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 No acute findings. Electronically Signed   By: Franky Crease M.D.   On: 10/20/2023 02:05   DG HIP UNILAT WITH PELVIS 2-3 VIEWS RIGHT Result Date: 10/18/2023 CLINICAL DATA:  Fall EXAM: DG HIP (WITH OR WITHOUT PELVIS) 2-3V RIGHT COMPARISON:  CT 10/13/2023 FINDINGS: No fracture or malalignment.  Moderate hip arthritis. IMPRESSION: No acute osseous abnormality. Electronically Signed   By: Luke Bun M.D.   On: 10/18/2023 23:45   DG HIP UNILAT WITH PELVIS 2-3 VIEWS LEFT Result Date: 10/18/2023 CLINICAL DATA:  Fall EXAM: DG HIP (WITH OR WITHOUT PELVIS) 2-3V LEFT COMPARISON:  CT 10/13/2023 FINDINGS: SI joints are non widened. Pubic symphysis and rami are intact. No acute fracture or malalignment. IMPRESSION: No acute osseous abnormality Electronically Signed   By: Luke Bun M.D.   On: 10/18/2023 23:44   US  BIOPSY (LIVER) Result Date: 10/18/2023 INDICATION: 63 year old female with a history liver lesion, possible tumor versus abscess EXAM: ULTRASOUND-GUIDED LIVER LESION BIOPSY  MEDICATIONS: None. ANESTHESIA/SEDATION: Moderate (conscious) sedation was employed during this procedure. A total of Versed  1.5 mg and Fentanyl  50 mcg was administered intravenously by the radiology nurse. Total intra-service moderate Sedation Time: 18 minutes. The patient's level of consciousness and vital signs were monitored continuously by radiology nursing throughout the procedure under my direct supervision. COMPLICATIONS: NONE PROCEDURE: Informed written consent was obtained from the patient after a thorough discussion of the procedural risks, benefits and alternatives. All questions were addressed. Maximal Sterile Barrier Technique was utilized including caps, mask, sterile gowns, sterile gloves, sterile drape, hand hygiene and skin antiseptic. A timeout was performed prior to the initiation of the procedure. Ultrasound survey of the right liver lobe performed with images stored and sent to PACs. The lesion in segment 6 is hypoechoic and quite small. The only way to visualize the lesion for a possible biopsy was from a medial to lateral approach from the anterior. The right lower thorax/right upper abdomen was prepped with chlorhexidine  in a sterile fashion, and a sterile drape was applied covering the operative field. A sterile gown and sterile gloves were used for the procedure. Local anesthesia was provided with 1% Lidocaine . The patient was prepped and draped sterilely and the skin and subcutaneous tissues were generously infiltrated with 1% lidocaine . A 17 gauge introducer needle was then advanced under ultrasound guidance in a subcostal location into the right liver lobe. The stylet was removed, and aspiration was attempted. No aspirate returned. Multiple separate 18 gauge core biopsy were then retrieved. Samples were placed into formalin for transportation to the lab. The needle was removed, and a final ultrasound image was performed. The patient tolerated the procedure well and remained  hemodynamically stable throughout. No complications were encountered and no significant blood loss was encounter. IMPRESSION: Status post ultrasound-guided biopsy of right liver lesion. Aspiration was attempted, with no aspirate achieved. Signed, Jaime  CANDIE Bellman, DO, ABVM, RPVI [facility] Electronically Signed   By: Ami Bellman D.O.   On: 10/18/2023 11:23    Microbiology: Results for orders placed or performed during the hospital encounter of 11/13/23  Resp panel by RT-PCR (RSV, Flu A&B, Covid) Anterior Nasal Swab     Status: None   Collection Time: 11/13/23  4:26 PM   Specimen: Anterior Nasal Swab  Result Value Ref Range Status   SARS Coronavirus 2 by RT PCR NEGATIVE NEGATIVE Final    Comment: (NOTE) SARS-CoV-2 target nucleic acids are NOT DETECTED.  The SARS-CoV-2 RNA is generally detectable in upper respiratory specimens during the acute phase of infection. The lowest concentration of SARS-CoV-2 viral copies this assay can detect is 138 copies/mL. A negative result does not preclude SARS-Cov-2 infection and should not be used as the sole basis for treatment or other patient management decisions. A negative result may occur with  improper specimen collection/handling, submission of specimen other than nasopharyngeal swab, presence of viral mutation(s) within the areas targeted by this assay, and inadequate number of viral copies(<138 copies/mL). A negative result must be combined with clinical observations, patient history, and epidemiological information. The expected result is Negative.  Fact Sheet for Patients:  bloggercourse.com  Fact Sheet for Healthcare Providers:  seriousbroker.it  This test is no t yet approved or cleared by the [place]  FDA and  has been authorized for detection and/or diagnosis of SARS-CoV-2 by FDA under an Emergency Use Authorization  (EUA). This EUA will remain  in effect (meaning this test can be used) for the duration of the COVID-19 declaration under Section 564(b)(1) of the Act, 21 U.S.C.section 360bbb-3(b)(1), unless the authorization is terminated  or revoked sooner.       Influenza A by PCR NEGATIVE NEGATIVE Final   Influenza B by PCR NEGATIVE NEGATIVE Final    Comment: (NOTE) The Xpert Xpress SARS-CoV-2/FLU/RSV plus assay is intended as an aid in the diagnosis of influenza from Nasopharyngeal swab specimens and should not be used as a sole basis for treatment. Nasal washings and aspirates are unacceptable for Xpert Xpress SARS-CoV-2/FLU/RSV testing.  Fact Sheet for Patients: bloggercourse.com  Fact Sheet for Healthcare Providers: seriousbroker.it  This test is not yet approved or cleared by the [place]  FDA and has been authorized for detection and/or diagnosis of SARS-CoV-2 by FDA under an Emergency Use Authorization (EUA). This EUA will remain in effect (meaning this test can be used) for the duration of the COVID-19 declaration under Section 564(b)(1) of the Act, 21 U.S.C. section 360bbb-3(b)(1), unless the authorization is terminated or revoked.     Resp Syncytial Virus by PCR NEGATIVE NEGATIVE Final    Comment: (NOTE) Fact Sheet for Patients: bloggercourse.com  Fact Sheet for Healthcare Providers: seriousbroker.it  This test is not yet approved or cleared by the [place]  FDA and has been authorized for detection and/or diagnosis of SARS-CoV-2 by FDA under an Emergency Use Authorization (EUA). This EUA will remain in effect (meaning this test can be used) for the duration of the COVID-19 declaration under Section 564(b)(1) of the Act, 21 U.S.C. section 360bbb-3(b)(1), unless the authorization is terminated or revoked.  Performed at [facility], [address], [place], [place] [address]    *Note: Due to a large number of results and/or encounters for the requested time period, some results have not been displayed. A complete set of results can be found in Results Review.  Labs: CBC: Recent Labs  Lab 11/09/23 0340 11/13/23 1629 11/14/23 0549  WBC 5.1 23.6* 23.6*  NEUTROABS 2.6 18.2*  --   HGB 9.1* 8.9* 8.8*  HCT 27.6* 27.8* 27.3*  MCV 86.3 90.3 88.3  PLT 42* 49* 68*   Basic Metabolic Panel: Recent Labs  Lab 11/09/23 0340 11/13/23 1712 11/14/23 0549 11/14/23 1622 11/15/23 0525  NA 138 140 141 138 137  K 3.6 2.5* 2.9* 4.1 4.0  CL 99 100 101 103 100  CO2 28 34* 30 27 28   GLUCOSE 99 94 64* 128* 91  BUN 12 14 16 16 19   CREATININE 0.71 0.80 0.85 0.82 0.86  CALCIUM  8.8* 8.1* 7.6* 7.6* 7.5*  MG 1.7  --  1.1* 2.0 1.8  PHOS  --   --  2.1* 3.6 3.9   Liver Function Tests: Recent Labs  Lab 11/13/23 1712  AST 15  ALT 14  ALKPHOS 80  BILITOT 0.7  PROT 5.4*  ALBUMIN 2.6*   CBG: Recent Labs  Lab 11/14/23 1637 11/14/23 2038  GLUCAP 129* 119*    Discharge time spent: less than 30 minutes.  Signed: Toribio Door, MD [facility] 11/15/2023

## 2023-11-15 NOTE — Plan of Care (Signed)

## 2023-11-16 ENCOUNTER — Inpatient Hospital Stay: Payer: 59

## 2023-11-16 ENCOUNTER — Inpatient Hospital Stay: Payer: 59 | Admitting: Physician Assistant

## 2023-11-16 NOTE — TOC Progression Note (Addendum)
 Transition of Care [facility]) - Progression Note    Patient Details  Name: [name] MRN: [id number] Date of Birth: Apr 03, 1961  Transition of Care [facility] CM/SW Contact  Sonda Manuella Quill, RN Phone Number: 11/16/2023, 10:27 AM  Clinical Narrative:    Return call from [facility] at [facility] [phone]); she was notified pt d/c'd on 11/15/23; she requested d/c summary be faxed to [phone]; document e-faxed; electronic confirmation received; also spoke w/ Tobias, Administrative Ass. confirmed receipt of documents.   Expected Discharge Plan: Home w Home Health Services Barriers to Discharge: No Barriers Identified  Expected Discharge Plan and Services   Discharge Planning Services: CM Consult Post Acute Care Choice: Resumption of Svcs/PTA Provider Living arrangements for the past 2 months: Single Family Home Expected Discharge Date: 11/15/23                         HH Arranged: RN, PT, OT [facility] Agency: [facility] Date [facility] Agency Contacted: 11/15/23 Time HH Agency Contacted: 1227 Representative spoke with at [facility] Agency: [place]   Social Determinants of Health (SDOH) Interventions SDOH Screenings   Food Insecurity: No Food Insecurity (11/15/2023)  Housing: Low Risk  (11/15/2023)  Transportation Needs: No Transportation Needs (11/15/2023)  Utilities: Not At Risk (11/15/2023)  Alcohol Screen: Low Risk  (07/23/2023)  Depression (PHQ2-9): Low Risk  (07/23/2023)  Tobacco Use: Medium Risk (11/13/2023)    Readmission Risk Interventions    11/15/2023   12:18 PM 11/15/2023   10:31 AM 10/18/2023   12:35 PM  Readmission Risk Prevention Plan  Transportation Screening Complete Complete Complete  Medication Review Oceanographer) Complete Complete Complete  PCP or Specialist appointment within 3-5 days of discharge Complete Complete Complete  HRI or Home Care Consult Complete Complete Complete  SW Recovery Care/Counseling Consult Complete Complete Complete  Palliative Care Screening  Not Applicable Complete Complete  Skilled Nursing Facility Not Applicable Not Applicable Not Applicable

## 2023-11-17 ENCOUNTER — Other Ambulatory Visit: Payer: Self-pay

## 2023-11-17 ENCOUNTER — Emergency Department (HOSPITAL_COMMUNITY)
Admission: EM | Admit: 2023-11-17 | Discharge: 2023-11-17 | Disposition: A | Discharge status: Home / Self Care | Payer: 59 | Attending: Emergency Medicine | Admitting: Emergency Medicine

## 2023-11-17 ENCOUNTER — Emergency Department (HOSPITAL_COMMUNITY): Payer: 59

## 2023-11-17 ENCOUNTER — Encounter (HOSPITAL_COMMUNITY): Payer: Self-pay

## 2023-11-17 DIAGNOSIS — Y92002 Bathroom of unspecified non-institutional (private) residence single-family (private) house as the place of occurrence of the external cause: Secondary | ICD-10-CM | POA: Diagnosis not present

## 2023-11-17 DIAGNOSIS — M549 Dorsalgia, unspecified: Secondary | ICD-10-CM | POA: Insufficient documentation

## 2023-11-17 DIAGNOSIS — Z7982 Long term (current) use of aspirin: Secondary | ICD-10-CM | POA: Insufficient documentation

## 2023-11-17 DIAGNOSIS — W01198A Fall on same level from slipping, tripping and stumbling with subsequent striking against other object, initial encounter: Secondary | ICD-10-CM | POA: Diagnosis not present

## 2023-11-17 DIAGNOSIS — W19XXXA Unspecified fall, initial encounter: Secondary | ICD-10-CM

## 2023-11-17 DIAGNOSIS — G893 Neoplasm related pain (acute) (chronic): Secondary | ICD-10-CM

## 2023-11-17 MED ORDER — DICLOFENAC EPOLAMINE 1.3 % EX PTCH: 1.0000 | MEDICATED_PATCH | Freq: Two times a day (BID) | CUTANEOUS | 0 refills | Status: DC

## 2023-11-17 MED ORDER — HYDROCODONE-ACETAMINOPHEN 5-325 MG PO TABS
1.0000 | ORAL_TABLET | Freq: Once | ORAL | Status: AC
  Administered 2023-11-17: 1 via ORAL
  Filled 2023-11-17: qty 1

## 2023-11-17 MED ORDER — HYDROCODONE-ACETAMINOPHEN 5-325 MG PO TABS: 1.0000 | ORAL_TABLET | Freq: Four times a day (QID) | ORAL | 0 refills | Status: DC | PRN

## 2023-11-17 NOTE — Discharge Instructions (Signed)
 As discussed, your evaluation today has been largely reassuring.  But, it is important that you monitor your condition carefully, and do not hesitate to return to the ED if you develop new, or concerning changes in your condition. ? ?Otherwise, please follow-up with your physician for appropriate ongoing care. ? ?

## 2023-11-17 NOTE — ED Provider Notes (Signed)
  EMERGENCY DEPARTMENT AT [facility] Provider Note   CSN: [id number] Arrival date & time: 11/17/23  1635     History  Chief Complaint  Patient presents with   [name]    [name] is a 63 y.o. female.  HPI   Patient presents via EMS after a fall.  Patient was in the bathroom, when she stumbled, fell, striking her back against the wall.  Since that time she has had severe back pain.  She denies head trauma, shoulder trauma, neck trauma or any pain in those areas. No weakness in any extremity.  EMS reports no hemodynamic instability, patient awake and alert throughout transport.  Home Medications Prior to Admission medications   Medication Sig Start Date End Date Taking? Authorizing Provider  diclofenac  (FLECTOR ) 1.3 % PTCH Place 1 patch onto the skin 2 (two) times daily. Apply to areas of pain following your fall 11/17/23  Yes Garrick Charleston, MD  acetaminophen  (TYLENOL ) 325 MG tablet Take 650 mg by mouth every 6 (six) hours as needed for mild pain or moderate pain.    [provider]  albuterol  (VENTOLIN  HFA) 108 (90 Base) MCG/ACT inhaler INHALE 2 PUFFS EVERY 6 HOURS AS NEEDED FOR WHEEZING OR SHORTNESS OF BREATH 09/23/19   Darlean Ozell NOVAK, MD  aspirin  81 MG chewable tablet Chew 1 tablet (81 mg total) by mouth daily. 09/22/23   Swayze, Ava, DO  atorvastatin  (LIPITOR) 20 MG tablet TAKE 1 TABLET (20 MG TOTAL) BY MOUTH DAILY. 03/28/21   Chandra Toribio POUR, MD  dexamethasone  (DECADRON ) 4 MG tablet Take 1 tablet (4 mg total) by mouth 2 (two) times daily. 10/19/23   Raenelle Coria, MD  fluticasone  (FLONASE ) 50 MCG/ACT nasal spray USE 2 SPRAYS IN BOTH  NOSTRILS DAILY AS NEEDED  FOR ALLERGIES 08/15/21   Wert, Michael B, MD  HYDROcodone -acetaminophen  (NORCO) 5-325 MG tablet Take 1 tablet by mouth every 6 (six) hours as needed for moderate pain (pain score 4-6). 11/17/23   Garrick Charleston, MD  levETIRAcetam  (KEPPRA ) 750 MG tablet Take 1 tablet (750 mg total) by mouth 2  (two) times daily. 09/21/23   Swayze, Ava, DO  lidocaine -prilocaine  (EMLA ) cream Apply to affected area once Patient not taking: Reported on 11/03/2023 08/01/23   Sherrod Sherrod, MD  MAGNESIUM -OXIDE 400 (240 Mg) MG tablet Take 1 tablet by mouth every morning. 10/19/23   [provider]  metoprolol  tartrate (LOPRESSOR ) 25 MG tablet Take 1 tablet (25 mg total) by mouth daily. 07/09/23   Shelah Charleston RAMAN, MD  montelukast  (SINGULAIR ) 10 MG tablet Take 10 mg by mouth at bedtime. 07/31/22   [provider]  Multiple Vitamin (MULTIVITAMIN) capsule Take 1 capsule by mouth daily with breakfast.    [provider]  omeprazole  (PRILOSEC) 20 MG capsule TAKE 1 CAPSULE EVERY DAY Patient taking differently: Take 20 mg by mouth daily before breakfast. 01/03/21   Chandra Toribio POUR, MD  ondansetron  (ZOFRAN ) 4 MG tablet Take 1 tablet (4 mg total) by mouth every 6 (six) hours as needed for nausea. Patient not taking: Reported on 11/03/2023 06/28/23   Will Almarie MATSU, MD  OXYGEN  Inhale 2-3 L/min into the lungs See admin instructions. Inhale 2-3 L/min into the lungs CONTINUOUSLY; 2 L/min at rest and 3 L/min if exerted    [provider]  potassium chloride  SA (KLOR-CON  M) 20 MEQ tablet Take 1 tablet (20 mEq total) by mouth daily for 14 days. 11/09/23 11/23/23  Cheryle Page, MD  prochlorperazine  (COMPAZINE )  10 MG tablet Take 1 tablet (10 mg total) by mouth every 6 (six) hours as needed for nausea or vomiting. Patient not taking: Reported on 11/03/2023 08/01/23   Sherrod Sherrod, MD  sertraline  (ZOLOFT ) 50 MG tablet TAKE 1 TABLET EVERY DAY 09/11/20   Chandra Toribio POUR, MD  STIOLTO RESPIMAT  2.5-2.5 MCG/ACT AERS USE 2 INHALATIONS BY MOUTH DAILY 09/17/23   Wert, Michael B, MD      Allergies    Codeine    Review of Systems   Review of Systems  Physical Exam Updated Vital Signs BP (!) 156/82   Pulse 78   Temp 98.3 F (36.8 C) (Oral)   Resp 14   Ht 5' 5 (1.651 m)   Wt 68.2 kg    SpO2 100%   BMI 25.02 kg/m  Physical Exam Vitals and nursing note reviewed.  Constitutional:      General: She is not in acute distress.    Appearance: She is well-developed.  HENT:     Head: Normocephalic and atraumatic.  Eyes:     Conjunctiva/sclera: Conjunctivae normal.  Pulmonary:     Effort: Pulmonary effort is normal. No respiratory distress.     Breath sounds: No stridor.  Abdominal:     General: There is no distension.  Musculoskeletal:     Comments: No gross abnormality of the back. Patient flexes each hip to command, without apparent disability.  Skin:    General: Skin is warm and dry.  Neurological:     Mental Status: She is alert and oriented to person, place, and time.     Cranial Nerves: No cranial nerve deficit.  Psychiatric:        Mood and Affect: Mood normal.     ED Results / Procedures / Treatments   Labs (all labs ordered are listed, but only abnormal results are displayed) Labs Reviewed - No data to display  EKG None  Radiology DG Pelvis 1-2 Views Result Date: 11/17/2023 CLINICAL DATA:  Back pain after fall EXAM: PELVIS - 1-2 VIEW COMPARISON:  None Available. FINDINGS: No acute fracture or dislocation. Degenerative changes pubic symphysis, both hips, SI joints and lower lumbar spine. IMPRESSION: No acute fracture or dislocation. Electronically Signed   By: Norman Gatlin M.D.   On: 11/17/2023 19:42   DG Thoracic Spine 2 View Result Date: 11/17/2023 CLINICAL DATA:  Clemens today.  Widespread mid back pain EXAM: THORACIC SPINE 2 VIEWS COMPARISON:  Chest CT 10/14/2023 FINDINGS: No evidence of acute fracture or traumatic listhesis. Mild age-related spondylotic changes. Right chest wall Port-A-Cath. IMPRESSION: No evidence of acute fracture or traumatic listhesis. Electronically Signed   By: Norman Gatlin M.D.   On: 11/17/2023 19:40   DG Lumbar Spine Complete Result Date: 11/17/2023 CLINICAL DATA:  Pain after fall. EXAM: LUMBAR SPINE - COMPLETE 5 VIEW  COMPARISON:  Lumbar spine x-ray 10/20/2023. FINDINGS: Five lumbar-type vertebral bodies. Preserved vertebral body heights. Slight anterolisthesis of L4 on L5. Prominent lower lumbar facet degenerative changes. Minimal scattered endplate osteophytes. Osteopenia. Spondylolysis. Recommend continue precautions until clinical clearance and if there is further concern additional workup with a CT as clinically appropriate for further sensitivity of injury. Scattered vascular calcifications. IMPRESSION: Osteopenia. Mild degenerative changes. Trace anterolisthesis of L4 on L5. Electronically Signed   By: Ranell Bring M.D.   On: 11/17/2023 19:38    Procedures Procedures    Medications Ordered in ED Medications  HYDROcodone -acetaminophen  (NORCO/VICODIN) 5-325 MG per tablet 1 tablet (has no administration in time range)  ED Course/ Medical Decision Making/ A&P                                 Medical Decision Making Patient with ongoing cancer, ongoing therapy now presents after mechanical fall with pain in her back.  Patient's initial vital signs are reassuring, neuroexam reassuring, concern for blunt trauma, x-rays ordered, patient monitored. Cardiac 80 sinus normal Pulse ox 100% room air normal   Amount and/or Complexity of Data Reviewed Independent Historian: EMS External Data Reviewed: notes.    Details: Ongoing cancer therapy Radiology: ordered and independent interpretation performed. Decision-making details documented in ED Course.  Risk Prescription drug management. Decision regarding hospitalization. Diagnosis or treatment significantly limited by social determinants of health.   8:34 PM X-rays discussed with the patient and family member at bedside.  She was previously coming by to other adult family members.  X-ray is unremarkable, vital signs unremarkable, given the patient's clear recall of the event, absence of other trauma absence of neurodeficits, patient appropriate for  discharge with outpatient follow-up.        Final Clinical Impression(s) / ED Diagnoses Final diagnoses:  Fall, initial encounter    Rx / DC Orders ED Discharge Orders          Ordered    HYDROcodone -acetaminophen  (NORCO) 5-325 MG tablet  Every 6 hours PRN        11/17/23 2034    diclofenac  (FLECTOR ) 1.3 % PTCH  2 times daily        11/17/23 2034              Garrick Charleston, MD 11/17/23 2034

## 2023-11-17 NOTE — ED Triage Notes (Signed)
 Patient brought in by EMS due to falling today. Pt reports tripping and falling over her oxygen cord. Denies any LOC, not on blood thinners and did not hit head. Pt complains of widespread mid back pain.

## 2023-11-19 ENCOUNTER — Inpatient Hospital Stay (HOSPITAL_BASED_OUTPATIENT_CLINIC_OR_DEPARTMENT_OTHER): Payer: 59 | Admitting: Nurse Practitioner

## 2023-11-19 ENCOUNTER — Inpatient Hospital Stay: Payer: 59

## 2023-11-19 ENCOUNTER — Inpatient Hospital Stay: Payer: 59 | Attending: Internal Medicine

## 2023-11-19 ENCOUNTER — Telehealth: Payer: Self-pay | Admitting: Internal Medicine

## 2023-11-19 ENCOUNTER — Encounter: Payer: Self-pay | Admitting: Nurse Practitioner

## 2023-11-19 VITALS — BP 107/70 | HR 95 | Temp 98.9°F | Resp 16 | Ht 65.0 in | Wt 152.3 lb

## 2023-11-19 DIAGNOSIS — C3491 Malignant neoplasm of unspecified part of right bronchus or lung: Secondary | ICD-10-CM

## 2023-11-19 DIAGNOSIS — G893 Neoplasm related pain (acute) (chronic): Secondary | ICD-10-CM

## 2023-11-19 DIAGNOSIS — R530 Neoplastic (malignant) related fatigue: Secondary | ICD-10-CM

## 2023-11-19 DIAGNOSIS — C7951 Secondary malignant neoplasm of bone: Secondary | ICD-10-CM | POA: Diagnosis not present

## 2023-11-19 DIAGNOSIS — Z79899 Other long term (current) drug therapy: Secondary | ICD-10-CM | POA: Insufficient documentation

## 2023-11-19 DIAGNOSIS — Z23 Encounter for immunization: Secondary | ICD-10-CM | POA: Diagnosis not present

## 2023-11-19 DIAGNOSIS — R296 Repeated falls: Secondary | ICD-10-CM | POA: Diagnosis not present

## 2023-11-19 DIAGNOSIS — Z87891 Personal history of nicotine dependence: Secondary | ICD-10-CM | POA: Diagnosis not present

## 2023-11-19 DIAGNOSIS — Z515 Encounter for palliative care: Secondary | ICD-10-CM

## 2023-11-19 DIAGNOSIS — Z7982 Long term (current) use of aspirin: Secondary | ICD-10-CM | POA: Insufficient documentation

## 2023-11-19 DIAGNOSIS — Z7952 Long term (current) use of systemic steroids: Secondary | ICD-10-CM | POA: Insufficient documentation

## 2023-11-19 DIAGNOSIS — C3490 Malignant neoplasm of unspecified part of unspecified bronchus or lung: Secondary | ICD-10-CM | POA: Diagnosis not present

## 2023-11-19 DIAGNOSIS — C7931 Secondary malignant neoplasm of brain: Secondary | ICD-10-CM | POA: Insufficient documentation

## 2023-11-19 DIAGNOSIS — Z5112 Encounter for antineoplastic immunotherapy: Secondary | ICD-10-CM | POA: Insufficient documentation

## 2023-11-19 DIAGNOSIS — K5903 Drug induced constipation: Secondary | ICD-10-CM

## 2023-11-19 DIAGNOSIS — C3411 Malignant neoplasm of upper lobe, right bronchus or lung: Secondary | ICD-10-CM | POA: Insufficient documentation

## 2023-11-19 DIAGNOSIS — M62838 Other muscle spasm: Secondary | ICD-10-CM | POA: Diagnosis not present

## 2023-11-19 DIAGNOSIS — D649 Anemia, unspecified: Secondary | ICD-10-CM

## 2023-11-19 DIAGNOSIS — Z95828 Presence of other vascular implants and grafts: Secondary | ICD-10-CM

## 2023-11-19 LAB — CBC WITH DIFFERENTIAL (CANCER CENTER ONLY)
Abs Immature Granulocytes: 1.27 10*3/uL — ABNORMAL HIGH (ref 0.00–0.07)
Basophils Absolute: 0.1 10*3/uL (ref 0.0–0.1)
Basophils Relative: 1 %
Eosinophils Absolute: 0 10*3/uL (ref 0.0–0.5)
Eosinophils Relative: 0 %
HCT: 23.4 % — ABNORMAL LOW (ref 36.0–46.0)
Hemoglobin: 7.9 g/dL — ABNORMAL LOW (ref 12.0–15.0)
Immature Granulocytes: 6 %
Lymphocytes Relative: 3 %
Lymphs Abs: 0.6 10*3/uL — ABNORMAL LOW (ref 0.7–4.0)
MCH: 29.3 pg (ref 26.0–34.0)
MCHC: 33.8 g/dL (ref 30.0–36.0)
MCV: 86.7 fL (ref 80.0–100.0)
Monocytes Absolute: 1.6 10*3/uL — ABNORMAL HIGH (ref 0.1–1.0)
Monocytes Relative: 8 %
Neutro Abs: 16.2 10*3/uL — ABNORMAL HIGH (ref 1.7–7.7)
Neutrophils Relative %: 82 %
Platelet Count: 254 10*3/uL (ref 150–400)
RBC: 2.7 MIL/uL — ABNORMAL LOW (ref 3.87–5.11)
RDW: 15.4 % (ref 11.5–15.5)
WBC Count: 19.8 10*3/uL — ABNORMAL HIGH (ref 4.0–10.5)
nRBC: 0.2 % (ref 0.0–0.2)

## 2023-11-19 LAB — CMP (CANCER CENTER ONLY)
ALT: 10 U/L (ref 0–44)
AST: 12 U/L — ABNORMAL LOW (ref 15–41)
Albumin: 3 g/dL — ABNORMAL LOW (ref 3.5–5.0)
Alkaline Phosphatase: 82 U/L (ref 38–126)
Anion gap: 3 — ABNORMAL LOW (ref 5–15)
BUN: 8 mg/dL (ref 8–23)
CO2: 38 mmol/L — ABNORMAL HIGH (ref 22–32)
Calcium: 8.1 mg/dL — ABNORMAL LOW (ref 8.9–10.3)
Chloride: 104 mmol/L (ref 98–111)
Creatinine: 0.77 mg/dL (ref 0.44–1.00)
GFR, Estimated: >60 mL/min (ref >60)
Glucose, Bld: 97 mg/dL (ref 70–99)
Potassium: 3.8 mmol/L (ref 3.5–5.1)
Sodium: 145 mmol/L (ref 135–145)
Total Bilirubin: 0.5 mg/dL (ref 0.0–1.2)
Total Protein: 5.4 g/dL — ABNORMAL LOW (ref 6.5–8.1)

## 2023-11-19 LAB — SAMPLE TO BLOOD BANK

## 2023-11-19 LAB — TSH: TSH: 1.74 u[IU]/mL (ref 0.350–4.500)

## 2023-11-19 MED ORDER — BACLOFEN 10 MG PO TABS: 10.0000 mg | ORAL_TABLET | Freq: Three times a day (TID) | ORAL | 0 refills | Status: DC

## 2023-11-19 MED ORDER — HEPARIN SOD (PORK) LOCK FLUSH 100 UNIT/ML IV SOLN
500.0000 [IU] | Freq: Once | INTRAVENOUS | Status: AC
Start: 2023-11-19 — End: 2023-11-19
  Administered 2023-11-19: 500 [IU]

## 2023-11-19 MED ORDER — SODIUM CHLORIDE 0.9% FLUSH
10.0000 mL | Freq: Once | INTRAVENOUS | Status: AC
Start: 2023-11-19 — End: 2023-11-19
  Administered 2023-11-19: 10 mL

## 2023-11-19 MED ORDER — MELOXICAM 7.5 MG PO TABS: 7.5000 mg | ORAL_TABLET | Freq: Every day | ORAL | 2 refills | Status: DC

## 2023-11-19 MED ORDER — HYDROCODONE-ACETAMINOPHEN 5-325 MG PO TABS: 1.0000 | ORAL_TABLET | Freq: Four times a day (QID) | ORAL | 0 refills | Status: DC | PRN

## 2023-11-19 NOTE — Progress Notes (Signed)
 Palliative Medicine [facility]  Telephone:(336) [phone] [phone]   Name: [name] Date: 11/19/2023 MRN: [id number]  DOB: 1961-10-02  Patient Care Team: Claudene Prentice [name]., FNP as PCP - General (Family Medicine) Odean Potts, MD as Consulting Physician (Hematology and Oncology) Aron Shoulders, MD as Consulting Physician (General Surgery) Patrcia Cough, MD as Consulting Physician (Radiation Oncology) Sherrod Sherrod, MD as Consulting Physician (Oncology) Pickenpack-Cousar, Fannie SAILOR, NP as Nurse Practitioner (Hospice and Palliative Medicine)    INTERVAL HISTORY: [name] is a 63 y.o. 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 Patient also has a history of right breast cancer (07/2015), HTN, depression, and HLD.  Palliative ask to see for symptom management and goals of care.   SOCIAL HISTORY:     reports that she quit smoking about 20 years ago. Her smoking use included cigarettes. She started smoking about 40 years ago. She has a 20 pack-year smoking history. She has never used smokeless tobacco. She reports that she does not drink alcohol and does not use drugs.  ADVANCE DIRECTIVES:  Advanced directives on file name Billiejean Schimek as primary and Kim Oki as secondary decision makers should the patient become unable to speak for themselves.   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  MEDICATIONS:  Current Outpatient Medications  Medication Sig Dispense Refill   acetaminophen  (TYLENOL ) 325 MG tablet Take 650 mg by mouth every 6 (six) hours as needed for mild pain or moderate pain.     albuterol  (VENTOLIN  HFA) 108 (90 Base) MCG/ACT inhaler INHALE 2 PUFFS EVERY 6 HOURS AS NEEDED FOR WHEEZING OR SHORTNESS OF BREATH 54 g 0   aspirin  81 MG chewable tablet Chew 1 tablet (81 mg total) by mouth daily. 30 tablet 0   atorvastatin  (LIPITOR) 20 MG tablet TAKE 1 TABLET (20 MG TOTAL) BY MOUTH DAILY. 90 tablet 3   dexamethasone  (DECADRON ) 4 MG tablet Take 1 tablet (4 mg total) by mouth 2 (two) times daily.     diclofenac  (FLECTOR ) 1.3 % PTCH Place 1 patch onto the skin 2 (two) times daily. Apply to areas of pain following your fall 5 patch 0   fluticasone  (FLONASE ) 50 MCG/ACT nasal spray USE 2 SPRAYS IN BOTH  NOSTRILS DAILY AS NEEDED  FOR ALLERGIES 48 g 11   HYDROcodone -acetaminophen  (NORCO) 5-325 MG tablet Take 1 tablet by mouth every 6 (six) hours as needed for moderate pain (pain score 4-6). 10 tablet 0   levETIRAcetam  (KEPPRA ) 750 MG tablet Take 1 tablet (750 mg total) by mouth 2 (two) times daily. 60 tablet 0   lidocaine -prilocaine  (EMLA ) cream Apply to affected area once (  Patient not taking: Reported on 11/03/2023) 30 g 3   MAGNESIUM -OXIDE 400 (240 Mg) MG tablet Take 1 tablet by mouth every morning.     metoprolol  tartrate (LOPRESSOR ) 25 MG tablet Take 1 tablet (25 mg total) by mouth daily.     montelukast  (SINGULAIR ) 10 MG tablet Take 10 mg by mouth at bedtime.     Multiple Vitamin (MULTIVITAMIN) capsule Take 1 capsule by mouth daily with breakfast.     omeprazole  (PRILOSEC) 20 MG capsule TAKE 1 CAPSULE EVERY DAY (Patient taking differently: Take 20 mg by mouth daily before breakfast.) 90 capsule 1    ondansetron  (ZOFRAN ) 4 MG tablet Take 1 tablet (4 mg total) by mouth every 6 (six) hours as needed for nausea. (Patient not taking: Reported on 11/03/2023) 20 tablet 0   OXYGEN  Inhale 2-3 L/min into the lungs See admin instructions. Inhale 2-3 L/min into the lungs CONTINUOUSLY; 2 L/min at rest and 3 L/min if exerted     potassium chloride  SA (KLOR-CON  M) 20 MEQ tablet Take 1 tablet (20 mEq total) by mouth daily for 14 days. 14 tablet 0   prochlorperazine  (COMPAZINE ) 10 MG tablet Take 1 tablet (10 mg total) by mouth every 6 (six) hours as needed for nausea or vomiting. (Patient not taking: Reported on 11/03/2023) 30 tablet 1   sertraline  (ZOLOFT ) 50 MG tablet TAKE 1 TABLET EVERY DAY 90 tablet 1   STIOLTO RESPIMAT  2.5-2.5 MCG/ACT AERS USE 2 INHALATIONS BY MOUTH DAILY 12 g 3   No current facility-administered medications for this visit.    VITAL SIGNS: There were no vitals taken for this visit. There were no vitals filed for this visit.  Estimated body mass index is 25.02 kg/m as calculated from the following:   Height as of 11/17/23: 5' 5 (1.651 m).   Weight as of 11/17/23: 150 lb 5.7 oz (68.2 kg).   PERFORMANCE STATUS (ECOG) : 2 - Symptomatic, <50% confined to bed   Physical Exam General: NAD Cardiovascular: regular rate and rhythm Pulmonary: clear ant fields Abdomen: soft, nontender, + bowel sounds Extremities: no edema, no joint deformities Skin: dry, feet dry, cracking/redness  Neurological: AAO x3  IMPRESSION:  Ms. Minahan presents to clinic today for follow-up. Unfortunately, she has encountered multiple hospitalizations since our last visit. Most recent ED visit after falling in the bathroom. Patient states she went to stand up and became weak. Denies loss of consciousness. Son states patient has a tendency to close or squint her eyes when moving and ambulating at times which he attributes to some of her falls. We discussed safety with ambulating including visual view, changes,  pain, slip rugs, shoes, etc. I expressed concern to patient and son that her recurrent falls can lead to a more traumatic event each time she falls such as hitting her head or breaking a bone. Arlynn reports soreness in the feet, describing the skin as dry and raw. This condition started during a recent hospital stay where she received antibiotics per patient. States she also experiences episodes of numbness, tingling, and burning sensations in the feet.  is also complaining about bilateral feet pain stating her feet are constantly aching and burning due to dryness and cracking. She reports this occurred when she is was hospitalized 2 admissions ago after receiving antibiotics. She has not followed up with PCP or specialist regarding her concern.   The patient states her appetite is good. Denies nausea, vomiting, or diarrhea. Occasional constipation. We discussed daily use of stool softeners. Her family  assist with ADLs.   Ms. Raczkowski is complaining of severe, persistent pain in the arm, knees, ankles, and lower back. The pain has been so intense that it has disrupted her sleep. The patient has been taking hydrocodone  for pain management but reports that it only provides temporary relief. Back spasms also reported. She has a history of falls, with three incidents occurring since the last consultation. The most recent fall resulted in significant back pain, with the patient reporting that she may have blacked out during the incident. She reports a long-standing history of pain in the knees predating the recent falls. However, she has not seen an orthopedic doctor for these issues.   The patient's pain and discomfort have significantly impacted her quality of life, and she is seeking effective pain management and treatment for her symptoms. I recommended patient continue with hydrocodone  as needed for mild to moderate pain. Will prescribe muscle relaxer for back spasms. Mobic  for arthritic pain. Extensive  education provided on use of new medications, increased risk of falls, dosing, and efficacy. Patient and son verbalized understanding.   Ms. Campoy expresses her frustration in the recent decline in her health requiring multiple hospital visit in addition to delay in imaging and other outpatient appointments. Emotional support provided. She is hopeful for the ability to continue to treat the treatable allowing her an opportunity to thrive while effectively managing her symptoms.   All questions answered and support provided. We discussed Her current illness and what it means in the larger context of Her on-going co-morbidities. Natural disease trajectory and expectations were discussed.  I discussed the importance of continued conversation with family and their medical providers regarding overall plan of care and treatment options, ensuring decisions are within the context of the patients values and GOCs.  PLAN:  Chronic/Cancer Related Pain Severe pain in the arm, knees, ankles, and lower back. Recent falls with back injury. Current hydrocodone  regimen not providing sufficient relief. No orthopedic follow-up for knee pain. -Continue hydrocodone , increase to 1.5-2 tablets every 6-8 hours as needed for pain. -Prescribe muscle relaxer (baclofen ) for potential muscle injury from falls. -Prescribe Mobic  (meloxicam ) for inflammation and arthritic pain, take once daily with food. -Refer to orthopedic specialist for evaluation of chronic knee pain.  Frequent Falls Multiple falls, including one recent fall with back injury. Possible blackouts preceding falls. Denies LOC associated with falls.  -Education provided on home safety, assistance ambulating and with ADLs, preventing slips with throw rugs, wearing non skid shoes.   Dry/Sore Feet Complaints of dry, sore feet. Possible reaction to antibiotics. -Advise to keep feet moisturized with Aquaphor or Vaseline. -If numbness, tingling, or burning  occurs, patient to notify office.  Follow-up -Office visit on 12/06/2023 to evaluate response to new medications and overall condition. -If any changes occur before next appointment, patient to call office.  Patient expressed understanding and was in agreement with this plan. She also understands that She can call the clinic at any time with any questions, concerns, or complaints.   Any controlled substances utilized were prescribed in the context of palliative care. PDMP has been reviewed.   Visit consisted of counseling and education dealing with the complex and emotionally intense issues of symptom management and palliative care in the setting of serious and potentially life-threatening illness.  Levon Borer, AGPCNP-BC  Palliative Medicine Team/[facility]

## 2023-11-20 ENCOUNTER — Other Ambulatory Visit: Payer: Self-pay | Admitting: Radiation Therapy

## 2023-11-20 ENCOUNTER — Encounter: Payer: Self-pay | Admitting: Internal Medicine

## 2023-11-20 ENCOUNTER — Telehealth: Payer: Self-pay | Admitting: Radiation Therapy

## 2023-11-20 ENCOUNTER — Encounter: Payer: Self-pay | Admitting: Hematology and Oncology

## 2023-11-20 DIAGNOSIS — C7931 Secondary malignant neoplasm of brain: Secondary | ICD-10-CM

## 2023-11-20 LAB — T4: T4, Total: 9.8 ug/dL (ref 4.5–12.0)

## 2023-11-20 NOTE — Telephone Encounter (Signed)
 I spoke with Ms. Kenneth's daughter, Ellaree, about her upcoming brain MRI on 1/13. The patient lost her phone during one of her recent hospital admissions, all calls need to go though the family instead.   I also asked if the patient is interested in having genetic testing per the request of Darice Monte. Ellaree is not sure what her mother may want to do regarding this. She plans to talk to her sister and mother to make this decision.   Devere Perch R.T.(R)(T) Radiation Special Procedures Navigator

## 2023-11-22 ENCOUNTER — Inpatient Hospital Stay: Payer: 59

## 2023-11-24 ENCOUNTER — Emergency Department (HOSPITAL_COMMUNITY): Payer: 59

## 2023-11-24 ENCOUNTER — Other Ambulatory Visit: Payer: Self-pay

## 2023-11-24 ENCOUNTER — Encounter (HOSPITAL_COMMUNITY): Payer: Self-pay

## 2023-11-24 ENCOUNTER — Emergency Department (HOSPITAL_COMMUNITY)
Admission: EM | Admit: 2023-11-24 | Discharge: 2023-11-24 | Disposition: A | Discharge status: Home / Self Care | Payer: 59 | Attending: Emergency Medicine | Admitting: Emergency Medicine

## 2023-11-24 DIAGNOSIS — D649 Anemia, unspecified: Secondary | ICD-10-CM

## 2023-11-24 DIAGNOSIS — K6289 Other specified diseases of anus and rectum: Secondary | ICD-10-CM | POA: Diagnosis present

## 2023-11-24 DIAGNOSIS — K5730 Diverticulosis of large intestine without perforation or abscess without bleeding: Secondary | ICD-10-CM | POA: Insufficient documentation

## 2023-11-24 DIAGNOSIS — K5790 Diverticulosis of intestine, part unspecified, without perforation or abscess without bleeding: Secondary | ICD-10-CM

## 2023-11-24 DIAGNOSIS — Z7982 Long term (current) use of aspirin: Secondary | ICD-10-CM | POA: Insufficient documentation

## 2023-11-24 LAB — CBC WITH DIFFERENTIAL/PLATELET
Abs Immature Granulocytes: 0.33 10*3/uL — ABNORMAL HIGH (ref 0.00–0.07)
Basophils Absolute: 0 10*3/uL (ref 0.0–0.1)
Basophils Relative: 0 %
Eosinophils Absolute: 0 10*3/uL (ref 0.0–0.5)
Eosinophils Relative: 0 %
HCT: 22.6 % — ABNORMAL LOW (ref 36.0–46.0)
Hemoglobin: 7.1 g/dL — ABNORMAL LOW (ref 12.0–15.0)
Immature Granulocytes: 2 %
Lymphocytes Relative: 4 %
Lymphs Abs: 0.6 10*3/uL — ABNORMAL LOW (ref 0.7–4.0)
MCH: 28.5 pg (ref 26.0–34.0)
MCHC: 31.4 g/dL (ref 30.0–36.0)
MCV: 90.8 fL (ref 80.0–100.0)
Monocytes Absolute: 1.3 10*3/uL — ABNORMAL HIGH (ref 0.1–1.0)
Monocytes Relative: 9 %
Neutro Abs: 13.3 10*3/uL — ABNORMAL HIGH (ref 1.7–7.7)
Neutrophils Relative %: 85 %
Platelets: 197 10*3/uL (ref 150–400)
RBC: 2.49 MIL/uL — ABNORMAL LOW (ref 3.87–5.11)
RDW: 14.6 % (ref 11.5–15.5)
WBC: 15.6 10*3/uL — ABNORMAL HIGH (ref 4.0–10.5)
nRBC: 0.1 % (ref 0.0–0.2)

## 2023-11-24 LAB — COMPREHENSIVE METABOLIC PANEL
ALT: 10 U/L (ref 0–44)
AST: 13 U/L — ABNORMAL LOW (ref 15–41)
Albumin: 2.5 g/dL — ABNORMAL LOW (ref 3.5–5.0)
Alkaline Phosphatase: 78 U/L (ref 38–126)
Anion gap: 3 — ABNORMAL LOW (ref 5–15)
BUN: 11 mg/dL (ref 8–23)
CO2: 36 mmol/L — ABNORMAL HIGH (ref 22–32)
Calcium: 8.2 mg/dL — ABNORMAL LOW (ref 8.9–10.3)
Chloride: 97 mmol/L — ABNORMAL LOW (ref 98–111)
Creatinine, Ser: 0.53 mg/dL (ref 0.44–1.00)
GFR, Estimated: >60 mL/min (ref >60)
Glucose, Bld: 94 mg/dL (ref 70–99)
Potassium: 3.1 mmol/L — ABNORMAL LOW (ref 3.5–5.1)
Sodium: 136 mmol/L (ref 135–145)
Total Bilirubin: 0.8 mg/dL (ref 0.0–1.2)
Total Protein: 5.6 g/dL — ABNORMAL LOW (ref 6.5–8.1)

## 2023-11-24 MED ORDER — HEPARIN SOD (PORK) LOCK FLUSH 100 UNIT/ML IV SOLN: 250.0000 [IU] | INTRAVENOUS | Status: DC | PRN

## 2023-11-24 MED ORDER — IOHEXOL 300 MG/ML  SOLN
100.0000 mL | Freq: Once | INTRAMUSCULAR | Status: AC | PRN
  Administered 2023-11-24: 100 mL via INTRAVENOUS

## 2023-11-24 NOTE — Discharge Instructions (Addendum)
 Please be sure to follow-up with your physicians in the coming days as we discussed.  If you develop new, or concerning changes return here.

## 2023-11-24 NOTE — ED Notes (Signed)
 Pt states her son can come pick her up and bring oxygen tank, [facility] cancelled

## 2023-11-24 NOTE — ED Triage Notes (Signed)
 Pt reports noticing bright red blood in her underwear last night, denies any n/v/d, abd pain, states "I think I have hemorrhoids"

## 2023-11-24 NOTE — ED Provider Notes (Signed)
  EMERGENCY DEPARTMENT AT [facility] Provider Note   CSN: [id number] Arrival date & time: 11/24/23  1417     History  Chief Complaint  Patient presents with   Abdominal Pain    [name] is a 63 y.o. female.  HPI Patient presents with concern of rectal bleeding, rectal pain.  Patient has multiple medical issues including brain cancer for which she is not currently receiving therapy, but is scheduled to begin doing so soon. Yesterday, and again today the patient had episodes of bright red blood per rectum with rectal pain while ostensibly having a bowel movement.  There is no other abdominal pain, no syncope, no chest pain, no dyspnea.  After second episode today she presents for evaluation.  EMS reports no hemodynamic instability in transport.    Home Medications Prior to Admission medications   Medication Sig Start Date End Date Taking? Authorizing Provider  acetaminophen  (TYLENOL ) 325 MG tablet Take 650 mg by mouth every 6 (six) hours as needed for mild pain or moderate pain.    [provider]  albuterol  (VENTOLIN  HFA) 108 (90 Base) MCG/ACT inhaler INHALE 2 PUFFS EVERY 6 HOURS AS NEEDED FOR WHEEZING OR SHORTNESS OF BREATH 09/23/19   Darlean Ozell NOVAK, MD  aspirin  81 MG chewable tablet Chew 1 tablet (81 mg total) by mouth daily. 09/22/23   Swayze, Ava, DO  atorvastatin  (LIPITOR) 20 MG tablet TAKE 1 TABLET (20 MG TOTAL) BY MOUTH DAILY. 03/28/21   Chandra Toribio POUR, MD  baclofen  (LIORESAL ) 10 MG tablet Take 1 tablet (10 mg total) by mouth 3 (three) times daily. 11/19/23   Pickenpack-Cousar, Athena N, NP  dexamethasone  (DECADRON ) 4 MG tablet Take 1 tablet (4 mg total) by mouth 2 (two) times daily. 10/19/23   Raenelle Coria, MD  diclofenac  (FLECTOR ) 1.3 % PT[facility] Place 1 patch onto the skin 2 (two) times daily. Apply to areas of pain following your fall 11/17/23   Garrick Charleston, MD  fluticasone  (FLONASE ) 50 MCG/ACT nasal spray USE 2 SPRAYS IN BOTH  NOSTRILS DAILY  AS NEEDED  FOR ALLERGIES 08/15/21   Wert, Michael B, MD  HYDROcodone -acetaminophen  (NORCO) 5-325 MG tablet Take 1 tablet by mouth every 6 (six) hours as needed for moderate pain (pain score 4-6). 11/19/23   Pickenpack-Cousar, Athena N, NP  levETIRAcetam  (KEPPRA ) 750 MG tablet Take 1 tablet (750 mg total) by mouth 2 (two) times daily. 09/21/23   Swayze, Ava, DO  lidocaine -prilocaine  (EMLA ) cream Apply to affected area once Patient not taking: Reported on 11/03/2023 08/01/23   Sherrod Sherrod, MD  MAGNESIUM -OXIDE 400 (240 Mg) MG tablet Take 1 tablet by mouth every morning. 10/19/23   [provider]  meloxicam  (MOBIC ) 7.5 MG tablet Take 1 tablet (7.5 mg total) by mouth daily. 11/19/23   Pickenpack-Cousar, Athena N, NP  metoprolol  tartrate (LOPRESSOR ) 25 MG tablet Take 1 tablet (25 mg total) by mouth daily. 07/09/23   Shelah Charleston RAMAN, MD  montelukast  (SINGULAIR ) 10 MG tablet Take 10 mg by mouth at bedtime. 07/31/22   [provider]  Multiple Vitamin (MULTIVITAMIN) capsule Take 1 capsule by mouth daily with breakfast.    [provider]  omeprazole  (PRILOSEC) 20 MG capsule TAKE 1 CAPSULE EVERY DAY Patient taking differently: Take 20 mg by mouth daily before breakfast. 01/03/21   Chandra Toribio POUR, MD  ondansetron  (ZOFRAN ) 4 MG tablet Take 1 tablet (4 mg total) by mouth every 6 (six) hours as needed for nausea. Patient not taking: Reported  on 11/03/2023 06/28/23   Will Almarie MATSU, MD  OXYGEN  Inhale 2-3 L/min into the lungs See admin instructions. Inhale 2-3 L/min into the lungs CONTINUOUSLY; 2 L/min at rest and 3 L/min if exerted    [provider]  potassium chloride  SA (KLOR-CON  M) 20 MEQ tablet Take 1 tablet (20 mEq total) by mouth daily for 14 days. 11/09/23 11/23/23  Cheryle Page, MD  prochlorperazine  (COMPAZINE ) 10 MG tablet Take 1 tablet (10 mg total) by mouth every 6 (six) hours as needed for nausea or vomiting. Patient not taking: Reported on 11/03/2023 08/01/23    Sherrod Sherrod, MD  sertraline  (ZOLOFT ) 50 MG tablet TAKE 1 TABLET EVERY DAY 09/11/20   Chandra Toribio POUR, MD  STIOLTO RESPIMAT  2.5-2.5 MCG/ACT AERS USE 2 INHALATIONS BY MOUTH DAILY 09/17/23   Wert, Michael B, MD      Allergies    Codeine    Review of Systems   Review of Systems  Physical Exam Updated Vital Signs BP (!) 146/76   Pulse 84   Temp 98.3 F (36.8 C) (Oral)   Resp 16   Ht 5' 5 (1.651 m)   Wt 69.1 kg   SpO2 100%   BMI 25.35 kg/m  Physical Exam Vitals and nursing note reviewed. Exam conducted with a chaperone present.  Constitutional:      General: She is not in acute distress.    Appearance: She is well-developed.  HENT:     Head: Normocephalic and atraumatic.  Eyes:     Conjunctiva/sclera: Conjunctivae normal.  Cardiovascular:     Rate and Rhythm: Normal rate and regular rhythm.  Pulmonary:     Effort: Pulmonary effort is normal. No respiratory distress.     Breath sounds: Normal breath sounds. No stridor.  Abdominal:     General: There is no distension.  Genitourinary:    Skin:    General: Skin is warm and dry.  Neurological:     Mental Status: She is alert and oriented to person, place, and time.     Cranial Nerves: No cranial nerve deficit.  Psychiatric:        Mood and Affect: Mood normal.     ED Results / Procedures / Treatments   Labs (all labs ordered are listed, but only abnormal results are displayed) Labs Reviewed  COMPREHENSIVE METABOLIC PANEL - Abnormal; Notable for the following components:      Result Value   Potassium 3.1 (*)    Chloride 97 (*)    CO2 36 (*)    Calcium  8.2 (*)    Total Protein 5.6 (*)    Albumin 2.5 (*)    AST 13 (*)    Anion gap 3 (*)    All other components within normal limits  CBC WITH DIFFERENTIAL/PLATELET - Abnormal; Notable for the following components:   WBC 15.6 (*)    RBC 2.49 (*)    Hemoglobin 7.1 (*)    HCT 22.6 (*)    Neutro Abs 13.3 (*)    Lymphs Abs 0.6 (*)    Monocytes Absolute 1.3  (*)    Abs Immature Granulocytes 0.33 (*)    All other components within normal limits    EKG None  Radiology CT ABDOMEN PELVIS W CONTRAST Result Date: 11/24/2023 CLINICAL DATA:  Hematochezia. Lung carcinoma. * Tracking Code: BO * EXAM: CT ABDOMEN AND PELVIS WITH CONTRAST TECHNIQUE: Multidetector CT imaging of the abdomen and pelvis was performed using the standard protocol following bolus administration of intravenous contrast.  RADIATION DOSE REDUCTION: This exam was performed according to the departmental dose-optimization program which includes automated exposure control, adjustment of the mA and/or kV according to patient size and/or use of iterative reconstruction technique. CONTRAST:  OMNIPAQUE  IOHEXOL  300 MG/ML  SOLN COMPARISON:  11/03/2023 FINDINGS: Lower Chest: No acute findings.  Stable bibasilar scarring. Hepatobiliary: 2 ill-defined low-attenuation lesions in the posterior right hepatic lobe measuring 1.9 cm and 1.1 cm show no significant change since recent study, but remain highly suspicious for liver metastases. No new or enlarging liver lesions identified. Gallbladder is unremarkable. No evidence of biliary ductal dilatation. Pancreas:  No mass or inflammatory changes. Spleen: No No evidence of splenomegaly. 11 mm low-attenuation lesion in the posterior spleen is stable and has nonspecific features. Adrenals/Urinary Tract: No suspicious masses identified. No evidence of ureteral calculi or hydronephrosis. Unremarkable unopacified urinary bladder. Stomach/Bowel: No evidence of obstruction, inflammatory process or abnormal fluid collections. Normal appendix visualized. Mild colonic diverticulosis again seen, without signs of diverticulitis. No mass identified. Vascular/Lymphatic: No pathologically enlarged lymph nodes. No acute vascular findings. Reproductive: Prior hysterectomy noted. Adnexal regions are unremarkable in appearance. Other:  None. Musculoskeletal: No suspicious bone  lesions identified. Mild superior endplate compression fracture of the T12 vertebral body is new since previous study. IMPRESSION: New mild compression fracture of the T12 vertebral body. No other acute findings identified. Stable small right hepatic lobe lesions, which remain highly suspicious for liver metastases. Stable 11 mm nonspecific low-attenuation lesion in the spleen. Mild colonic diverticulosis, without radiographic evidence of diverticulitis. Electronically Signed   By: Norleen DELENA Kil M.D.   On: 11/24/2023 17:55    Procedures Procedures    Medications Ordered in ED Medications  heparin  lock flush 100 unit/mL (has no administration in time range)  iohexol  (OMNIPAQUE ) 300 MG/ML solution 100 mL (100 mLs Intravenous Contrast Given 11/24/23 1730)    ED Course/ Medical Decision Making/ A&P                                 Medical Decision Making Female with multiple medical problems including cancer, now presents with rectal bleeding and pain.  She does possibly have a history of diverticulosis, but she is unsure of actual prior issues.  Concern for diverticulosis versus rectal lesion versus abscess.  Given her malignancy history, consideration of metastatic or new primary as well.  Patient had CT, labs, monitoring. Cardiac 90 sinus normal Pulse ox 100% room air normal  Amount and/or Complexity of Data Reviewed Independent Historian: EMS External Data Reviewed: notes. Labs: ordered. Radiology: ordered and independent interpretation performed. Decision-making details documented in ED Course.  Risk Prescription drug management. Decision regarding hospitalization. Diagnosis or treatment significantly limited by social determinants of health.   6:09 PM Patient remains hemodynamically unremarkable, no distress, no current complaints no additional bowel movements with blood since arrival.  Findings reviewed, discussed, mild leukocytosis, otherwise findings consistent with known concern  for metastatic malignancy.  CT abdomen pelvis with diverticulosis as well, without any substantial change in hours of monitoring here, no evidence of bacteremia, sepsis, substantial exsanguination, patient will follow-up with outpatient toxicity scheduled to do so in the coming days.        Final Clinical Impression(s) / ED Diagnoses Final diagnoses:  Diverticulosis    Rx / DC Orders ED Discharge Orders     None         Garrick Charleston, MD 11/24/23 1810

## 2023-11-24 NOTE — ED Notes (Signed)
 Pt reports she did not bring oxygen tank, came here via ems. Needs transport home set up, [facility] called and transport set up

## 2023-11-26 ENCOUNTER — Ambulatory Visit (HOSPITAL_COMMUNITY): Payer: 59 | Attending: Radiation Oncology

## 2023-11-26 MED FILL — Fosaprepitant Dimeglumine For IV Infusion 150 MG (Base Eq): INTRAVENOUS | Qty: 5 | Status: AC

## 2023-11-27 ENCOUNTER — Inpatient Hospital Stay: Payer: 59

## 2023-11-27 ENCOUNTER — Other Ambulatory Visit: Payer: Self-pay

## 2023-11-27 ENCOUNTER — Inpatient Hospital Stay (HOSPITAL_BASED_OUTPATIENT_CLINIC_OR_DEPARTMENT_OTHER): Payer: 59 | Admitting: Internal Medicine

## 2023-11-27 VITALS — BP 163/83 | HR 90 | Temp 98.9°F | Resp 18

## 2023-11-27 DIAGNOSIS — Z5112 Encounter for antineoplastic immunotherapy: Secondary | ICD-10-CM | POA: Diagnosis not present

## 2023-11-27 DIAGNOSIS — D649 Anemia, unspecified: Secondary | ICD-10-CM

## 2023-11-27 DIAGNOSIS — C3491 Malignant neoplasm of unspecified part of right bronchus or lung: Secondary | ICD-10-CM

## 2023-11-27 DIAGNOSIS — Z95828 Presence of other vascular implants and grafts: Secondary | ICD-10-CM

## 2023-11-27 LAB — CBC WITH DIFFERENTIAL (CANCER CENTER ONLY)
Abs Immature Granulocytes: 0.93 10*3/uL — ABNORMAL HIGH (ref 0.00–0.07)
Basophils Absolute: 0 10*3/uL (ref 0.0–0.1)
Basophils Relative: 0 %
Eosinophils Absolute: 0 10*3/uL (ref 0.0–0.5)
Eosinophils Relative: 0 %
HCT: 24.2 % — ABNORMAL LOW (ref 36.0–46.0)
Hemoglobin: 7.8 g/dL — ABNORMAL LOW (ref 12.0–15.0)
Immature Granulocytes: 5 %
Lymphocytes Relative: 6 %
Lymphs Abs: 1 10*3/uL (ref 0.7–4.0)
MCH: 28.4 pg (ref 26.0–34.0)
MCHC: 32.2 g/dL (ref 30.0–36.0)
MCV: 88 fL (ref 80.0–100.0)
Monocytes Absolute: 1.6 10*3/uL — ABNORMAL HIGH (ref 0.1–1.0)
Monocytes Relative: 9 %
Neutro Abs: 14.9 10*3/uL — ABNORMAL HIGH (ref 1.7–7.7)
Neutrophils Relative %: 80 %
Platelet Count: 246 10*3/uL (ref 150–400)
RBC: 2.75 MIL/uL — ABNORMAL LOW (ref 3.87–5.11)
RDW: 14.8 % (ref 11.5–15.5)
WBC Count: 18.5 10*3/uL — ABNORMAL HIGH (ref 4.0–10.5)
nRBC: 0.3 % — ABNORMAL HIGH (ref 0.0–0.2)

## 2023-11-27 LAB — CMP (CANCER CENTER ONLY)
ALT: 7 U/L (ref 0–44)
AST: 11 U/L — ABNORMAL LOW (ref 15–41)
Albumin: 3.2 g/dL — ABNORMAL LOW (ref 3.5–5.0)
Alkaline Phosphatase: 104 U/L (ref 38–126)
Anion gap: 5 (ref 5–15)
BUN: 9 mg/dL (ref 8–23)
CO2: 41 mmol/L — ABNORMAL HIGH (ref 22–32)
Calcium: 8.7 mg/dL — ABNORMAL LOW (ref 8.9–10.3)
Chloride: 101 mmol/L (ref 98–111)
Creatinine: 0.85 mg/dL (ref 0.44–1.00)
GFR, Estimated: >60 mL/min (ref >60)
Glucose, Bld: 114 mg/dL — ABNORMAL HIGH (ref 70–99)
Potassium: 3.2 mmol/L — ABNORMAL LOW (ref 3.5–5.1)
Sodium: 147 mmol/L — ABNORMAL HIGH (ref 135–145)
Total Bilirubin: 0.6 mg/dL (ref 0.0–1.2)
Total Protein: 6.1 g/dL — ABNORMAL LOW (ref 6.5–8.1)

## 2023-11-27 LAB — SAMPLE TO BLOOD BANK

## 2023-11-27 LAB — PREPARE RBC (CROSSMATCH)

## 2023-11-27 MED ORDER — SODIUM CHLORIDE 0.9% IV SOLUTION
250.0000 mL | INTRAVENOUS | Status: DC
  Administered 2023-11-27: 100 mL via INTRAVENOUS

## 2023-11-27 MED ORDER — DIPHENHYDRAMINE HCL 25 MG PO CAPS
25.0000 mg | ORAL_CAPSULE | Freq: Once | ORAL | Status: AC
  Administered 2023-11-27: 25 mg via ORAL
  Filled 2023-11-27: qty 1

## 2023-11-27 MED ORDER — SODIUM CHLORIDE 0.9 % IV SOLN: Freq: Once | INTRAVENOUS | Status: AC

## 2023-11-27 MED ORDER — ACETAMINOPHEN 325 MG PO TABS
650.0000 mg | ORAL_TABLET | Freq: Once | ORAL | Status: AC
  Administered 2023-11-27: 650 mg via ORAL
  Filled 2023-11-27: qty 2

## 2023-11-27 MED ORDER — SODIUM CHLORIDE 0.9% FLUSH
10.0000 mL | Freq: Once | INTRAVENOUS | Status: AC
  Administered 2023-11-27: 10 mL

## 2023-11-27 MED ORDER — SODIUM CHLORIDE 0.9 % IV SOLN
200.0000 mg | Freq: Once | INTRAVENOUS | Status: AC
  Administered 2023-11-27: 200 mg via INTRAVENOUS
  Filled 2023-11-27: qty 200

## 2023-11-27 NOTE — Progress Notes (Signed)
 Spoke with Blood bank and confirmed blood orders.

## 2023-11-27 NOTE — Progress Notes (Signed)
 [facility] [phone]   [phone]  OFFICE PROGRESS NOTE  Erin Prentice [name]., [name] [address] [place] [address]  DIAGNOSIS: Stage IV (T3, N0, M1C) non-small cell lung cancer, squamous cell carcinoma presented with right upper lobe lung mass in addition to solitary brain metastasis and T1 bone metastasis. She was diagnosed in August 2024.  She also has liver metastasis in December  2024   PDL1 Expression: 20%   PRIOR THERAPY:  1) SRS to the metastatic brain lesion on 07/23/2023 under the care of Dr. Patrcia. 2) SBRT to the right upper lobe lung mass and SBRT to the solitary osseous metastasis at T1. Last dose on 08/27/23 3) SRS to the new left temporal and occipital lobe metastatic brain lesion under the care of Dr. Patrcia, expected on 09/06/23.   CURRENT THERAPY:  1) palliative systemic chemoimmunotherapy with carboplatin  for AUC of 5, paclitaxel  175 Mg/M2 and Keytruda  200 Mg IV every 3 weeks with Neulasta  support. First dose expected on 09/28/23.  Starting from cycle #3 the patient will be on single agent Keytruda  200 Mg IV every 3 weeks.  Systemic chemotherapy was discontinued secondary to intolerance.  INTERVAL HISTORY: [name] 63 y.o. female returns to the clinic today for follow-up visit accompanied by her son daughter. Discussed the use of AI scribe software for clinical note transcription with the patient, who gave verbal consent to proceed.  History of Present Illness   [name], a 63 year old patient, was diagnosed with stage four non-small cell lung cancer, specifically squamous cell carcinoma, in August 2024. The patient has a PDL1 expression of 20%. She was started on a chemotherapy regimen of carboplatin , paclitaxel , and Keytruda . However, after two cycles of this treatment, the patient experienced significant side effects, including hospitalization due to suspected pneumonia and general sickness.  The patient reports  feeling pretty good currently, but notes persistent sleepiness and a decrease in appetite. She has experienced significant weight loss, approximately nine pounds since her last visit. The patient also reports a persistent cough, which has been described as wet.  The patient's hemoglobin level is currently at 7.8, significantly lower than the normal range for women, which is between 12 to 15. This has necessitated frequent blood transfusions. The patient is also on oxygen  therapy, currently at two liters.       MEDICAL HISTORY: Past Medical History:  Diagnosis Date   Acute on chronic respiratory failure (HCC) 10/09/2016   Allergic rhinitis    Breast cancer (HCC) 2016   right breast   COPD, mild (HCC) FOLLOWED BY DR TZMU   Depression    GERD (gastroesophageal reflux disease)    HTN (hypertension)    Hypertension    Iron deficiency anemia    Long-term current use of steroids SYMBICORT  INHALER   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    Overweight (BMI 25.0-29.9) 05/11/2016   Palpitations    Personal history of radiation therapy 2016   Sarcoidosis STABLE PER CXR JUNE 2013   Shortness of breath    Sickle cell trait (HCC)     ALLERGIES:  is allergic to codeine.  MEDICATIONS:  Current Outpatient Medications  Medication Sig Dispense Refill   acetaminophen  (TYLENOL ) 325 MG tablet Take 650 mg by mouth every 6 (six) hours as needed for mild pain or moderate pain.     albuterol  (VENTOLIN  HFA) 108 (90 Base) MCG/ACT inhaler INHALE 2 PUFFS EVERY 6 HOURS AS NEEDED FOR WHEEZING OR SHORTNESS OF BREATH  54 g 0   aspirin  81 MG chewable tablet Chew 1 tablet (81 mg total) by mouth daily. 30 tablet 0   atorvastatin  (LIPITOR) 20 MG tablet TAKE 1 TABLET (20 MG TOTAL) BY MOUTH DAILY. 90 tablet 3   baclofen  (LIORESAL ) 10 MG tablet Take 1 tablet (10 mg total) by mouth 3 (three) times daily. 30 each 0   dexamethasone  (DECADRON ) 4 MG tablet Take 1 tablet (4 mg total) by mouth 2 (two) times daily.     diclofenac  (FLECTOR ) 1.3  % PTCH Place 1 patch onto the skin 2 (two) times daily. Apply to areas of pain following your fall 5 patch 0   fluticasone  (FLONASE ) 50 MCG/ACT nasal spray USE 2 SPRAYS IN BOTH  NOSTRILS DAILY AS NEEDED  FOR ALLERGIES 48 g 11   HYDROcodone -acetaminophen  (NORCO) 5-325 MG tablet Take 1 tablet by mouth every 6 (six) hours as needed for moderate pain (pain score 4-6). 10 tablet 0   levETIRAcetam  (KEPPRA ) 750 MG tablet Take 1 tablet (750 mg total) by mouth 2 (two) times daily. 60 tablet 0   lidocaine -prilocaine  (EMLA ) cream Apply to affected area once (Patient not taking: Reported on 11/03/2023) 30 g 3   MAGNESIUM -OXIDE 400 (240 Mg) MG tablet Take 1 tablet by mouth every morning.     meloxicam  (MOBIC ) 7.5 MG tablet Take 1 tablet (7.5 mg total) by mouth daily. 30 tablet 2   metoprolol  tartrate (LOPRESSOR ) 25 MG tablet Take 1 tablet (25 mg total) by mouth daily.     montelukast  (SINGULAIR ) 10 MG tablet Take 10 mg by mouth at bedtime.     Multiple Vitamin (MULTIVITAMIN) capsule Take 1 capsule by mouth daily with breakfast.     omeprazole  (PRILOSEC) 20 MG capsule TAKE 1 CAPSULE EVERY DAY (Patient taking differently: Take 20 mg by mouth daily before breakfast.) 90 capsule 1   ondansetron  (ZOFRAN ) 4 MG tablet Take 1 tablet (4 mg total) by mouth every 6 (six) hours as needed for nausea. (Patient not taking: Reported on 11/03/2023) 20 tablet 0   OXYGEN  Inhale 2-3 L/min into the lungs See admin instructions. Inhale 2-3 L/min into the lungs CONTINUOUSLY; 2 L/min at rest and 3 L/min if exerted     potassium chloride  SA (KLOR-CON  M) 20 MEQ tablet Take 1 tablet (20 mEq total) by mouth daily for 14 days. 14 tablet 0   prochlorperazine  (COMPAZINE ) 10 MG tablet Take 1 tablet (10 mg total) by mouth every 6 (six) hours as needed for nausea or vomiting. (Patient not taking: Reported on 11/03/2023) 30 tablet 1   sertraline  (ZOLOFT ) 50 MG tablet TAKE 1 TABLET EVERY DAY 90 tablet 1   STIOLTO RESPIMAT  2.5-2.5 MCG/ACT AERS USE 2  INHALATIONS BY MOUTH DAILY 12 g 3   No current facility-administered medications for this visit.    SURGICAL HISTORY:  Past Surgical History:  Procedure Laterality Date   ADJUSTABLE SUTURE MANIPULATION  05/22/2012   Procedure: ADJUSTABLE SUTURE MANIPULATION;  Surgeon: Ozell DELENA Mirza, MD;  Location: [facility];  Service: Ophthalmology;  Laterality: Right;   BREAST LUMPECTOMY Right 2016   BRONCHIAL BIOPSY  07/09/2023   Procedure: BRONCHIAL BIOPSIES;  Surgeon: Shelah Lamar RAMAN, MD;  Location: [facility] ENDOSCOPY;  Service: Pulmonary;;   BRONCHIAL BRUSHINGS  07/09/2023   Procedure: BRONCHIAL BRUSHINGS;  Surgeon: Shelah Lamar RAMAN, MD;  Location: [facility] ENDOSCOPY;  Service: Pulmonary;;   BRONCHIAL NEEDLE ASPIRATION BIOPSY  07/09/2023   Procedure: BRONCHIAL NEEDLE ASPIRATION BIOPSIES;  Surgeon: Shelah Lamar RAMAN, MD;  Location: [facility] ENDOSCOPY;  Service: Pulmonary;;   CESAREAN SECTION  1986   W/ BILATERAL TUBAL LIGATION   COLONOSCOPY WITH PROPOFOL  N/A 02/21/2019   Procedure: COLONOSCOPY WITH PROPOFOL ;  Surgeon: Abran Norleen SAILOR, MD;  Location: [facility] ENDOSCOPY;  Service: Endoscopy;  Laterality: N/A;   EYE SURGERY     IR IMAGING GUIDED PORT INSERTION  08/01/2023   MEDIAN RECTUS REPAIR  05/22/2012   Procedure: MEDIAN RECTUS REPAIR;  Surgeon: Ozell DELENA Mirza, MD;  Location: [facility];  Service: Ophthalmology;  Laterality: Bilateral;  INFERIOR RECTUS RESECTION WITH ADJUSTIBLE SUTURES RIGHT EYE    POLYPECTOMY  02/21/2019   Procedure: POLYPECTOMY;  Surgeon: Abran Norleen SAILOR, MD;  Location: [facility] ENDOSCOPY;  Service: Endoscopy;;   RADIOACTIVE SEED GUIDED PARTIAL MASTECTOMY WITH AXILLARY SENTINEL LYMPH NODE BIOPSY Right 08/18/2015   Procedure: RADIOACTIVE SEED GUIDED PARTIAL MASTECTOMY WITH AXILLARY SENTINEL LYMPH NODE BIOPSY;  Surgeon: Jina Nephew, MD;  Location: [facility];  Service: General;  Laterality: Right;   TOTAL ROBOTIC ASSISTED LAPAROSCOPIC HYSTERECTOMY  12-30-2010    SYMPTOMATIC UTERINE FIBROIDS   UPPER TEETH EXTRACTION'S  1992   VIDEO BRONCHOSCOPY WITH RADIAL ENDOBRONCHIAL ULTRASOUND  07/09/2023   Procedure: VIDEO BRONCHOSCOPY WITH RADIAL ENDOBRONCHIAL ULTRASOUND;  Surgeon: Shelah Lamar RAMAN, MD;  Location: [facility] ENDOSCOPY;  Service: Pulmonary;;    REVIEW OF SYSTEMS:  Constitutional: positive for anorexia, fatigue, and weight loss Eyes: negative Ears, nose, mouth, throat, and face: negative Respiratory: positive for cough and dyspnea on exertion Cardiovascular: negative Gastrointestinal: positive for nausea Genitourinary:negative Integument/breast: negative Hematologic/lymphatic: negative Musculoskeletal:positive for arthralgias and muscle weakness Neurological: negative Behavioral/Psych: negative Endocrine: negative Allergic/Immunologic: negative   PHYSICAL EXAMINATION: General appearance: alert, cooperative, fatigued, and no distress Head: Normocephalic, without obvious abnormality, atraumatic Neck: no adenopathy, no JVD, supple, symmetrical, trachea midline, and thyroid  not enlarged, symmetric, no tenderness/mass/nodules Lymph nodes: Cervical, supraclavicular, and axillary nodes normal. Resp: clear to auscultation bilaterally Back: symmetric, no curvature. ROM normal. No CVA tenderness. Cardio: regular rate and rhythm, S1, S2 normal, no murmur, click, rub or gallop GI: soft, non-tender; bowel sounds normal; no masses,  no organomegaly Extremities: extremities normal, atraumatic, no cyanosis or edema Neurologic: Alert and oriented X 3, normal strength and tone. Normal symmetric reflexes. Normal coordination and gait  ECOG PERFORMANCE STATUS: 1 - Symptomatic but completely ambulatory  Blood pressure 134/73, pulse 80, temperature 97.6 F (36.4 C), temperature source Temporal, resp. rate 17, height 5' 5 (1.651 m), weight 143 lb 6.4 oz (65 kg), SpO2 100%.  LABORATORY DATA: Lab Results  Component Value Date   WBC 18.5 (H) 11/27/2023   HGB 7.8  (L) 11/27/2023   HCT 24.2 (L) 11/27/2023   MCV 88.0 11/27/2023   PLT 246 11/27/2023      Chemistry      Component Value Date/Time   NA 147 (H) 11/27/2023 1109   NA 146 (H) 01/10/2019 1220   K 3.2 (L) 11/27/2023 1109   CL 101 11/27/2023 1109   CO2 41 (H) 11/27/2023 1109   BUN 9 11/27/2023 1109   BUN 21 01/10/2019 1220   CREATININE 0.85 11/27/2023 1109   CREATININE 0.64 02/04/2015 1619      Component Value Date/Time   CALCIUM  8.7 (L) 11/27/2023 1109   ALKPHOS 104 11/27/2023 1109   AST 11 (L) 11/27/2023 1109   ALT 7 11/27/2023 1109   BILITOT 0.6 11/27/2023 1109       RADIOGRAPHIC STUDIES: CT ABDOMEN PELVIS W CONTRAST Result Date: 11/24/2023 CLINICAL DATA:  Hematochezia. Lung  carcinoma. * Tracking Code: BO * EXAM: CT ABDOMEN AND PELVIS WITH CONTRAST TECHNIQUE: Multidetector CT imaging of the abdomen and pelvis was performed using the standard protocol following bolus administration of intravenous contrast. RADIATION DOSE REDUCTION: This exam was performed according to the departmental dose-optimization program which includes automated exposure control, adjustment of the mA and/or kV according to patient size and/or use of iterative reconstruction technique. CONTRAST:  OMNIPAQUE  IOHEXOL  300 MG/ML  SOLN COMPARISON:  11/03/2023 FINDINGS: Lower Chest: No acute findings.  Stable bibasilar scarring. Hepatobiliary: 2 ill-defined low-attenuation lesions in the posterior right hepatic lobe measuring 1.9 cm and 1.1 cm show no significant change since recent study, but remain highly suspicious for liver metastases. No new or enlarging liver lesions identified. Gallbladder is unremarkable. No evidence of biliary ductal dilatation. Pancreas:  No mass or inflammatory changes. Spleen: No No evidence of splenomegaly. 11 mm low-attenuation lesion in the posterior spleen is stable and has nonspecific features. Adrenals/Urinary Tract: No suspicious masses identified. No evidence of ureteral calculi or  hydronephrosis. Unremarkable unopacified urinary bladder. Stomach/Bowel: No evidence of obstruction, inflammatory process or abnormal fluid collections. Normal appendix visualized. Mild colonic diverticulosis again seen, without signs of diverticulitis. No mass identified. Vascular/Lymphatic: No pathologically enlarged lymph nodes. No acute vascular findings. Reproductive: Prior hysterectomy noted. Adnexal regions are unremarkable in appearance. Other:  None. Musculoskeletal: No suspicious bone lesions identified. Mild superior endplate compression fracture of the T12 vertebral body is new since previous study. IMPRESSION: New mild compression fracture of the T12 vertebral body. No other acute findings identified. Stable small right hepatic lobe lesions, which remain highly suspicious for liver metastases. Stable 11 mm nonspecific low-attenuation lesion in the spleen. Mild colonic diverticulosis, without radiographic evidence of diverticulitis. Electronically Signed   By: Norleen DELENA Kil M.D.   On: 11/24/2023 17:55   DG Pelvis 1-2 Views Result Date: 11/17/2023 CLINICAL DATA:  Back pain after fall EXAM: PELVIS - 1-2 VIEW COMPARISON:  None Available. FINDINGS: No acute fracture or dislocation. Degenerative changes pubic symphysis, both hips, SI joints and lower lumbar spine. IMPRESSION: No acute fracture or dislocation. Electronically Signed   By: Norman Gatlin M.D.   On: 11/17/2023 19:42   DG Thoracic Spine 2 View Result Date: 11/17/2023 CLINICAL DATA:  Clemens today.  Widespread mid back pain EXAM: THORACIC SPINE 2 VIEWS COMPARISON:  Chest CT 10/14/2023 FINDINGS: No evidence of acute fracture or traumatic listhesis. Mild age-related spondylotic changes. Right chest wall Port-A-Cath. IMPRESSION: No evidence of acute fracture or traumatic listhesis. Electronically Signed   By: Norman Gatlin M.D.   On: 11/17/2023 19:40   DG Lumbar Spine Complete Result Date: 11/17/2023 CLINICAL DATA:  Pain after fall. EXAM: LUMBAR  SPINE - COMPLETE 5 VIEW COMPARISON:  Lumbar spine x-ray 10/20/2023. FINDINGS: Five lumbar-type vertebral bodies. Preserved vertebral body heights. Slight anterolisthesis of L4 on L5. Prominent lower lumbar facet degenerative changes. Minimal scattered endplate osteophytes. Osteopenia. Spondylolysis. Recommend continue precautions until clinical clearance and if there is further concern additional workup with a CT as clinically appropriate for further sensitivity of injury. Scattered vascular calcifications. IMPRESSION: Osteopenia. Mild degenerative changes. Trace anterolisthesis of L4 on L5. Electronically Signed   By: Ranell Bring M.D.   On: 11/17/2023 19:38   DG Chest Port 1 View Result Date: 11/13/2023 CLINICAL DATA:  Weakness.  History of lung cancer. EXAM: PORTABLE CHEST 1 VIEW COMPARISON:  11/03/2023 radiograph and CT FINDINGS: Right chest port remains in place. Stable radiographic appearance of right lung volume loss, apical opacity  and streaky right midlung opacity. The left upper lobe opacity on CT is not well seen by radiograph. Stable heart size and mediastinal contours. Slight blunting of right costophrenic angle with small effusion on CT. No pneumothorax. No pulmonary edema. IMPRESSION: 1. Stable radiographic appearance of the chest. Right lung findings consistent with known neoplasm. The left upper lobe opacity on prior CT is not well demonstrated by radiograph. 2. Small right pleural effusion, unchanged. Electronically Signed   By: Andrea Gasman M.D.   On: 11/13/2023 20:01   VAS US  UPPER EXTREMITY VENOUS DUPLEX Result Date: 11/04/2023 UPPER VENOUS STUDY  Patient Name:  GEORGANA ROMAIN  Date of Exam:   11/04/2023 Medical Rec #: [id number]       Accession #:    [id number] Date of Birth: 1961-04-26        Patient Gender: F Patient Age:   58 years Exam Location:  [facility] Procedure:      VAS US  UPPER EXTREMITY VENOUS DUPLEX Referring Phys: PARDEEP KHATRI  --------------------------------------------------------------------------------  Indications: Swelling, and Pain Risk Factors: Cancer Breast past pregnancy. Comparison Study: None Performing Technologist: Garnette Rockers  Examination Guidelines: A complete evaluation includes B-mode imaging, spectral Doppler, color Doppler, and power Doppler as needed of all accessible portions of each vessel. Bilateral testing is considered an integral part of a complete examination. Limited examinations for reoccurring indications may be performed as noted.  Right Findings: +----------+------------+---------+-----------+----------+-------+ RIGHT     CompressiblePhasicitySpontaneousPropertiesSummary +----------+------------+---------+-----------+----------+-------+ IJV           Full       Yes       Yes                      +----------+------------+---------+-----------+----------+-------+ Subclavian    Full       Yes       Yes                      +----------+------------+---------+-----------+----------+-------+ Axillary      Full       Yes       Yes                      +----------+------------+---------+-----------+----------+-------+ Brachial      Full       Yes       Yes                      +----------+------------+---------+-----------+----------+-------+ Radial        Full       Yes       Yes                      +----------+------------+---------+-----------+----------+-------+ Ulnar         Full       Yes       Yes                      +----------+------------+---------+-----------+----------+-------+ Cephalic      Full                 Yes                      +----------+------------+---------+-----------+----------+-------+ Basilic       Full                 Yes                      +----------+------------+---------+-----------+----------+-------+  Left Findings: +----------+------------+---------+-----------+----------+-------+ LEFT       CompressiblePhasicitySpontaneousPropertiesSummary +----------+------------+---------+-----------+----------+-------+ Subclavian    Full       Yes       Yes                      +----------+------------+---------+-----------+----------+-------+  Summary:  Right: No evidence of deep vein thrombosis in the upper extremity. No evidence of superficial vein thrombosis in the upper extremity.  Left: No evidence of thrombosis in the subclavian.  *See table(s) above for measurements and observations.  Diagnosing physician: Gaile New MD Electronically signed by Gaile New MD on 11/04/2023 at 7:21:59 PM.    Final    CT Angio Chest/Abd/Pel for Dissection W and/or Wo Contrast Result Date: 11/03/2023 CLINICAL DATA:  Abdominal pain, tachycardia, pulsatile abdominal mass history of lung cancer * Tracking Code: BO * EXAM: CT ANGIOGRAPHY CHEST, ABDOMEN AND PELVIS TECHNIQUE: Non-contrast CT of the chest was initially obtained. Multidetector CT imaging through the chest, abdomen and pelvis was performed using the standard protocol during bolus administration of intravenous contrast. Multiplanar reconstructed images and MIPs were obtained and reviewed to evaluate the vascular anatomy. RADIATION DOSE REDUCTION: This exam was performed according to the departmental dose-optimization program which includes automated exposure control, adjustment of the mA and/or kV according to patient size and/or use of iterative reconstruction technique. CONTRAST:  OMNIPAQUE  IOHEXOL  350 MG/ML SOLN COMPARISON:  CT chest, 09/19/2023, CT abdomen pelvis, 10/13/2023 FINDINGS: CTA CHEST FINDINGS VASCULAR Aorta: Satisfactory opacification of the aorta. Normal contour and caliber of the thoracic aorta. No evidence of aneurysm, dissection, or other acute aortic pathology. Moderate aortic atherosclerosis Cardiovascular: Right chest port catheter. No evidence of pulmonary embolism on limited non-tailored examination. Normal heart size.  Enlargement of the main pulmonary artery measuring up to 3.9 cm in caliber. No pericardial effusion. Review of the MIP images confirms the above findings. NON VASCULAR Mediastinum/Nodes: No enlarged mediastinal, hilar, or axillary lymph nodes. Thyroid  gland, trachea, and esophagus demonstrate no significant findings. Lungs/Pleura: Small right pleural effusion, similar to prior examination. Perhaps slightly diminished size of a cavitary mass of the right upper lobe measuring 4.9 x 3.2 cm, previously 4.8 x 3.6 cm, with somewhat increased cavitation (series 12, image 34). New, dense nodular consolidation in the anterior left upper lobe measuring 2.4 x 1.8 cm (series 12, image 55). Diffuse bilateral bronchial wall thickening. Bandlike scarring of the bilateral lung bases. Musculoskeletal: No chest wall abnormality. No acute osseous findings. Review of the MIP images confirms the above findings. CTA ABDOMEN AND PELVIS FINDINGS VASCULAR Normal contour and caliber of the abdominal aorta. No evidence of aneurysm, dissection, or other acute aortic pathology. Standard branching pattern of the abdominal aorta with solitary bilateral renal arteries. Moderate aortic atherosclerosis. Review of the MIP images confirms the above findings. NON-VASCULAR Hepatobiliary: No significant change in hypodense lesions of the posterior liver, largest measuring 1.9 x 1.5 cm (series 22, image 163). No gallstones, gallbladder wall thickening, or biliary dilatation. Pancreas: Unremarkable. No pancreatic ductal dilatation or surrounding inflammatory changes. Spleen: Normal in size without significant abnormality. Adrenals/Urinary Tract: Adrenal glands are unremarkable. Simple, benign renal cortical cysts, for which no further follow-up or characterization is required. Kidneys are otherwise normal, without renal calculi, solid lesion, or hydronephrosis. Bladder is unremarkable. Stomach/Bowel: Stomach is within normal limits. Appendix not clearly  visualized and may be surgically absent. No evidence of bowel wall thickening, distention, or inflammatory changes. Lymphatic: No enlarged abdominal or pelvic lymph nodes. Reproductive: Status post  hysterectomy Other: No abdominal wall hernia or abnormality. No ascites. Musculoskeletal: No acute osseous findings. IMPRESSION: 1. Normal contour and caliber of the thoracic and abdominal aorta. No evidence of aneurysm, dissection, or other acute aortic pathology. Moderate aortic atherosclerosis. 2. Perhaps slightly diminished size of a cavitary mass of the right upper lobe with somewhat increased cavitation. Findings are consistent with known primary lung malignancy. 3. New, dense nodular consolidation in the anterior left upper lobe measuring 2.4 x 1.8 cm. Findings are most consistent with infection rapid interval development. 4. Small right pleural effusion, similar to prior examination. 5. No significant change in hypodense lesions of the posterior liver, largest measuring 1.9 x 1.5 cm. These remain suspicious for metastases. 6. Enlargement of the main pulmonary artery, as can be seen in pulmonary hypertension. Aortic Atherosclerosis (ICD10-I70.0). Electronically Signed   By: Marolyn JONETTA Jaksch M.D.   On: 11/03/2023 09:28   DG Chest Portable 1 View Result Date: 11/03/2023 CLINICAL DATA:  Sepsis. Abdominal pain for 3 days. History of non-small cell lung cancer. EXAM: PORTABLE CHEST 1 VIEW COMPARISON:  10/12/2023 FINDINGS: Right chest wall porta catheter is noted with tip in the distal SVC. Heart size and mediastinal contours are stable. Unchanged small right pleural effusion. Right apical cavitary lung mass with surrounding pleuroparenchymal scarring is unchanged. Left lung is clear. No acute abnormality. IMPRESSION: 1. No acute findings. 2. Persistent small right pleural effusion. 3. Unchanged right apical cavitary lung mass with surrounding pleuroparenchymal scarring. Electronically Signed   By: Waddell Calk M.D.    On: 11/03/2023 08:16    ASSESSMENT AND PLAN: This is a very pleasant 63 years old African-American female with a stage IV (T3, N0, M1 C) non-small cell lung cancer, squamous cell carcinoma presented with right upper lobe lung mass in addition to solitary brain metastasis and T1 bone lesion diagnosed in August 2024 with PD-L1 expression of 20%.  She is status post SBRT to the right upper lobe lung mass as well as the T1 bone metastasis and the brain metastasis under the care of Dr. Patrcia.  The patient is currently undergoing systemic chemoimmunotherapy with carboplatin , paclitaxel  and Keytruda  first dose was given on September 28, 2023 with Neulasta  support.  Status post 2 cycles.  The patient has a rough time with the systemic chemotherapy with frequent hospitalization with pneumonia and pancytopenia. Starting from cycle #3 she will be on single agent Keytruda  200 Mg IV every 3 weeks. Assessment and Plan    Stage IV Non-Small Cell Lung Cancer (NSCLC) - Squamous Cell Carcinoma Diagnosed in August 2024 with PDL1 expression of 20%. Currently on carboplatin , paclitaxel , and Keytruda . Experienced significant adverse effects from chemotherapy, including hospitalizations for questionable pneumonia and severe sickness. Considering discontinuing chemotherapy and continuing with Keytruda  alone due to its favorable side effect profile and immune-stimulating properties. Keytruda  is administered every three weeks, potentially for up to two years, as long as it is tolerated and the cancer is not progressing. - Discontinue chemotherapy - Continue Keytruda  every three weeks - Administer Keytruda  today - Monitor response to Keytruda  and adjust treatment as necessary  Anemia Hemoglobin level is 7.8, likely secondary to underlying cancer and chemotherapy. Has been receiving blood transfusions. Reducing chemotherapy may decrease the need for frequent transfusions. - Administer a unit of blood today - Monitor  hemoglobin levels regularly  Cough Persistent wet cough, likely related to underlying lung cancer and recent pneumonia. Symptomatic treatment is necessary. Treating the cancer may improve the cough over time. - Recommend Mucinex  for  wet cough - If cough becomes dry, recommend Delsym  General Health Maintenance On 2 liters of oxygen . No need for Neulasta  injection as chemotherapy is discontinued. - Discontinue Neulasta  injection - Advise not to come for Neulasta  injection in two days  Follow-up - Continue Keytruda  every three weeks for up to two years, as long as it is tolerated and cancer is not progressing.   The patient was advised to call immediately if she has any other concerning symptoms in the interval The patient voices understanding of current disease status and treatment options and is in agreement with the current care plan.  All questions were answered. The patient knows to call the clinic with any problems, questions or concerns. We can certainly see the patient much sooner if necessary.  The total time spent in the appointment was 30 minutes.  Disclaimer: This note was dictated with voice recognition software. Similar sounding words can inadvertently be transcribed and may not be corrected upon review.

## 2023-11-27 NOTE — Patient Instructions (Signed)
 [facility] - A DEPT OF [facility]  Discharge Instructions: Thank you for choosing [facility] to provide your oncology and hematology care.   If you have a lab appointment with the Cancer Center, please go directly to the Cancer Center and check in at the registration area.   Wear comfortable clothing and clothing appropriate for easy access to any Portacath or PICC line.   We strive to give you quality time with your provider. You may need to reschedule your appointment if you arrive late (15 or more minutes).  Arriving late affects you and other patients whose appointments are after yours.  Also, if you miss three or more appointments without notifying the office, you may be dismissed from the clinic at the provider's discretion.      For prescription refill requests, have your pharmacy contact our office and allow 72 hours for refills to be completed.    Today you received the following chemotherapy and/or immunotherapy agents: Paclitaxel , Carboplatin .       To help prevent nausea and vomiting after your treatment, we encourage you to take your nausea medication as directed.  BELOW ARE SYMPTOMS THAT SHOULD BE REPORTED IMMEDIATELY: *FEVER GREATER THAN 100.4 F (38 C) OR HIGHER *CHILLS OR SWEATING *NAUSEA AND VOMITING THAT IS NOT CONTROLLED WITH YOUR NAUSEA MEDICATION *UNUSUAL SHORTNESS OF BREATH *UNUSUAL BRUISING OR BLEEDING *URINARY PROBLEMS (pain or burning when urinating, or frequent urination) *BOWEL PROBLEMS (unusual diarrhea, constipation, pain near the anus) TENDERNESS IN MOUTH AND THROAT WITH OR WITHOUT PRESENCE OF ULCERS (sore throat, sores in mouth, or a toothache) UNUSUAL RASH, SWELLING OR PAIN  UNUSUAL VAGINAL DISCHARGE OR ITCHING   Items with * indicate a potential emergency and should be followed up as soon as possible or go to the Emergency Department if any problems should occur.  Please show the CHEMOTHERAPY ALERT CARD  or IMMUNOTHERAPY ALERT CARD at check-in to the Emergency Department and triage nurse.  Should you have questions after your visit or need to cancel or reschedule your appointment, please contact [facility] - A DEPT OF [facility]  Dept: [phone]  and follow the prompts.  Office hours are 8:00 a.m. to 4:30 p.m. Monday - Friday. Please note that voicemails left after 4:00 p.m. may not be returned until the following business day.  We are closed weekends and major holidays. You have access to a nurse at all times for urgent questions. Please call the main number to the clinic Dept: [phone] and follow the prompts.   For any non-urgent questions, you may also contact your provider using MyChart. We now offer e-Visits for anyone 64 and older to request care online for non-urgent symptoms. For details visit [email or web address].   Also download the MyChart app! Go to the app store, search MyChart, open the app, select [employer], and log in with your MyChart username and password.

## 2023-11-28 ENCOUNTER — Encounter: Payer: Self-pay | Admitting: Internal Medicine

## 2023-11-28 ENCOUNTER — Telehealth: Payer: Self-pay | Admitting: Internal Medicine

## 2023-11-28 ENCOUNTER — Other Ambulatory Visit: Payer: Self-pay | Admitting: Internal Medicine

## 2023-11-28 DIAGNOSIS — D869 Sarcoidosis, unspecified: Secondary | ICD-10-CM

## 2023-11-28 LAB — TYPE AND SCREEN
ABO/RH(D): O POS
Antibody Screen: NEGATIVE
Unit division: 0

## 2023-11-28 LAB — BPAM RBC
Blood Product Expiration Date: 202502112359
ISSUE DATE / TIME: 202501141417
Unit Type and Rh: 5100

## 2023-11-28 NOTE — Progress Notes (Signed)
Apt for 11/29/2023 canceled.

## 2023-11-29 ENCOUNTER — Ambulatory Visit
Admission: RE | Admit: 2023-11-29 | Discharge: 2023-11-29 | Disposition: A | Discharge status: Home / Self Care | Payer: 59 | Source: Ambulatory Visit | Attending: Urology | Admitting: Urology

## 2023-11-29 ENCOUNTER — Other Ambulatory Visit: Payer: 59

## 2023-11-29 ENCOUNTER — Ambulatory Visit: Payer: 59

## 2023-12-04 NOTE — Progress Notes (Unsigned)
Palliative Medicine [facility]  [phone]) [phone] Fax:([phone]) [phone]   Name: [name] Date: 12/04/2023 MRN: [id number]  DOB: 25-Dec-1960  Patient Care Team: [name]., FNP as PCP - General (Family Medicine) Serena Croissant, MD as Consulting Physician (Hematology and Oncology) Almond Lint, MD as Consulting Physician (General Surgery) Margaretmary Dys, MD as Consulting Physician (Radiation Oncology) Si Gaul, MD as Consulting Physician (Oncology) Pickenpack-Cousar, Arty Baumgartner, NP as Nurse Practitioner (Hospice and Palliative Medicine)    INTERVAL HISTORY: [name] is a 63 y.o. 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 Patient also has a history of right breast cancer (07/2015), HTN, depression, and HLD.  Palliative ask to see for symptom management and goals of care.   SOCIAL HISTORY:     reports that she quit smoking about 20 years ago. Her smoking use included cigarettes. She started smoking about 40 years ago. She has a 20 pack-year smoking history. She has never used smokeless tobacco. She reports that she does not drink alcohol and does not use drugs.  ADVANCE DIRECTIVES:  Advanced directives on file name Abha Racanelli as primary and Ivone Papageorgiou as secondary decision makers should the patient become unable to speak for themselves.   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  MEDICATIONS:  Current Outpatient Medications  Medication Sig Dispense Refill   acetaminophen (TYLENOL) 325 MG tablet Take 650 mg by mouth every 6 (six) hours as needed for mild pain or moderate pain.     albuterol (VENTOLIN HFA) 108 (90 Base) MCG/ACT inhaler INHALE 2 PUFFS EVERY 6 HOURS AS NEEDED FOR WHEEZING OR SHORTNESS OF BREATH 54 g 0   aspirin 81 MG chewable tablet Chew 1 tablet (81 mg total) by mouth daily. 30 tablet 0   atorvastatin (LIPITOR) 20 MG tablet TAKE 1 TABLET (20 MG TOTAL) BY MOUTH DAILY. 90 tablet 3   baclofen (LIORESAL) 10 MG tablet Take 1 tablet (10 mg total) by mouth 3 (three) times daily. 30 each 0   dexamethasone (DECADRON) 4 MG tablet Take 1 tablet (4 mg total) by mouth 2 (two) times daily.     diclofenac (FLECTOR) 1.3 % PTCH Place 1 patch onto the skin 2 (two) times daily. Apply to areas of pain following your fall 5 patch 0   fluticasone (FLONASE) 50 MCG/ACT nasal spray USE 2 SPRAYS IN BOTH  NOSTRILS DAILY AS NEEDED  FOR ALLERGIES 48 g 11   HYDROcodone-acetaminophen (NORCO) 5-325 MG tablet Take 1 tablet by mouth every 6 (six) hours as needed for moderate pain (pain score 4-6). 10 tablet 0   levETIRAcetam (KEPPRA) 750 MG tablet Take 1 tablet (  750 mg total) by mouth 2 (two) times daily. 60 tablet 0   lidocaine-prilocaine (EMLA) cream Apply to affected area once (Patient not taking: Reported on 11/03/2023) 30 g 3   MAGNESIUM-OXIDE 400 (240 Mg) MG tablet Take 1 tablet by mouth every morning.     meloxicam (MOBIC) 7.5 MG tablet Take 1 tablet (7.5 mg total) by mouth daily. 30 tablet 2   metoprolol tartrate (LOPRESSOR) 25 MG tablet Take 1 tablet (25 mg total) by mouth daily.     montelukast (SINGULAIR) 10 MG tablet Take 10 mg by mouth at bedtime.     Multiple Vitamin (MULTIVITAMIN) capsule Take 1  capsule by mouth daily with breakfast.     omeprazole (PRILOSEC) 20 MG capsule TAKE 1 CAPSULE EVERY DAY (Patient taking differently: Take 20 mg by mouth daily before breakfast.) 90 capsule 1   ondansetron (ZOFRAN) 4 MG tablet Take 1 tablet (4 mg total) by mouth every 6 (six) hours as needed for nausea. (Patient not taking: Reported on 11/03/2023) 20 tablet 0   OXYGEN Inhale 2-3 L/min into the lungs See admin instructions. Inhale 2-3 L/min into the lungs CONTINUOUSLY; 2 L/min at rest and 3 L/min if exerted     potassium chloride SA (KLOR-CON M) 20 MEQ tablet Take 1 tablet (20 mEq total) by mouth daily for 14 days. 14 tablet 0   prochlorperazine (COMPAZINE) 10 MG tablet Take 1 tablet (10 mg total) by mouth every 6 (six) hours as needed for nausea or vomiting. (Patient not taking: Reported on 11/03/2023) 30 tablet 1   sertraline (ZOLOFT) 50 MG tablet TAKE 1 TABLET EVERY DAY 90 tablet 1   STIOLTO RESPIMAT 2.5-2.5 MCG/ACT AERS USE 2 INHALATIONS BY MOUTH DAILY 12 g 3   No current facility-administered medications for this visit.    VITAL SIGNS: There were no vitals taken for this visit. There were no vitals filed for this visit.  Estimated body mass index is 23.86 kg/m as calculated from the following:   Height as of 11/27/23: 5\' 5"  (1.651 m).   Weight as of 11/27/23: 143 lb 6.4 oz (65 kg).   PERFORMANCE STATUS (ECOG) : 2 - Symptomatic, <50% confined to bed   Physical Exam General: NAD Cardiovascular: regular rate and rhythm Pulmonary: clear ant fields Abdomen: soft, nontender, + bowel sounds Extremities: no edema, no joint deformities Skin: dry, feet dry, cracking/redness  Neurological: AAO x3  IMPRESSION:  [name] presents to clinic today for follow-up. Unfortunately, she has encountered multiple hospitalizations since our last visit. Most recent ED visit after falling in the bathroom. Patient states she went to stand up and became weak. Denies loss of consciousness. Son states  patient has a tendency to close or squint her eyes when moving and ambulating at times which he attributes to some of her falls. We discussed safety with ambulating including visual view, changes, pain, slip rugs, shoes, etc. I expressed concern to patient and son that her recurrent falls can lead to a more traumatic event each time she falls such as hitting her head or breaking a bone. Ariyon reports soreness in the feet, describing the skin as dry and raw. This condition started during a recent hospital stay where she received antibiotics per patient. States she also experiences episodes of numbness, tingling, and burning sensations in the feet.  is also complaining about bilateral feet pain stating her feet are constantly aching and burning due to dryness and cracking. She reports this occurred when she is was hospitalized 2 admissions  ago after receiving antibiotics. She has not followed up with PCP or specialist regarding her concern.   The patient states her appetite is good. Denies nausea, vomiting, or diarrhea. Occasional constipation. We discussed daily use of stool softeners. Her family assist with ADLs.   Ms. Nila is complaining of severe, persistent pain in the arm, knees, ankles, and lower back. The pain has been so intense that it has disrupted her sleep. The patient has been taking hydrocodone for pain management but reports that it only provides temporary relief. Back spasms also reported. She has a history of falls, with three incidents occurring since the last consultation. The most recent fall resulted in significant back pain, with the patient reporting that she may have blacked out during the incident. She reports a long-standing history of pain in the knees predating the recent falls. However, she has not seen an orthopedic doctor for these issues.   The patient's pain and discomfort have significantly impacted her quality of life, and she is seeking effective pain management and  treatment for her symptoms. I recommended patient continue with hydrocodone as needed for mild to moderate pain. Will prescribe muscle relaxer for back spasms. Mobic for arthritic pain. Extensive education provided on use of new medications, increased risk of falls, dosing, and efficacy. Patient and son verbalized understanding.   Ms. Kuchta expresses her frustration in the recent decline in her health requiring multiple hospital visit in addition to delay in imaging and other outpatient appointments. Emotional support provided. She is hopeful for the ability to continue to treat the treatable allowing her an opportunity to thrive while effectively managing her symptoms.   All questions answered and support provided. We discussed Her current illness and what it means in the larger context of Her on-going co-morbidities. Natural disease trajectory and expectations were discussed.  I discussed the importance of continued conversation with family and their medical providers regarding overall plan of care and treatment options, ensuring decisions are within the context of the patients values and GOCs.  PLAN:  Chronic/Cancer Related Pain Severe pain in the arm, knees, ankles, and lower back. Recent falls with back injury. Current hydrocodone regimen not providing sufficient relief. No orthopedic follow-up for knee pain. -Continue hydrocodone, increase to 1.5-2 tablets every 6-8 hours as needed for pain. -Prescribe muscle relaxer (baclofen) for potential muscle injury from falls. -Prescribe Mobic (meloxicam) for inflammation and arthritic pain, take once daily with food. -Refer to orthopedic specialist for evaluation of chronic knee pain.  Frequent Falls Multiple falls, including one recent fall with back injury. Possible blackouts preceding falls. Denies LOC associated with falls.  -Education provided on home safety, assistance ambulating and with ADLs, preventing slips with throw rugs, wearing non  skid shoes.   Dry/Sore Feet Complaints of dry, sore feet. Possible reaction to antibiotics. -Advise to keep feet moisturized with Aquaphor or Vaseline. -If numbness, tingling, or burning occurs, patient to notify office.  Follow-up -Office visit on 12/06/2023 to evaluate response to new medications and overall condition. -If any changes occur before next appointment, patient to call office.  Patient expressed understanding and was in agreement with this plan. She also understands that She can call the clinic at any time with any questions, concerns, or complaints.   Any controlled substances utilized were prescribed in the context of palliative care. PDMP has been reviewed.   Visit consisted of counseling and education dealing with the complex and emotionally intense issues of symptom management and palliative care in the setting of  serious and potentially life-threatening illness.  Willette Alma, AGPCNP-BC  Palliative Medicine Team/[facility]

## 2023-12-05 ENCOUNTER — Telehealth: Payer: Self-pay | Admitting: Radiation Therapy

## 2023-12-05 NOTE — Telephone Encounter (Signed)
I spoke with Ms. [name] about the rescheduled brain MRI and visit with Dr. [name]. And per her request, I also left a message for both of her daughters about these appointments as well.   Jalene Mullet R.T.(R)(T) Radiation Special Procedures Lead

## 2023-12-06 ENCOUNTER — Other Ambulatory Visit: Payer: Self-pay

## 2023-12-06 ENCOUNTER — Ambulatory Visit: Payer: 59 | Admitting: Internal Medicine

## 2023-12-06 ENCOUNTER — Emergency Department (HOSPITAL_COMMUNITY): Payer: 59

## 2023-12-06 ENCOUNTER — Inpatient Hospital Stay: Payer: 59 | Admitting: Nurse Practitioner

## 2023-12-06 ENCOUNTER — Other Ambulatory Visit: Payer: 59

## 2023-12-06 ENCOUNTER — Inpatient Hospital Stay (HOSPITAL_COMMUNITY)
Admission: EM | Admit: 2023-12-06 | Discharge: 2023-12-14 | DRG: 871 | Disposition: A | Discharge status: Hospice | Payer: 59 | Attending: Internal Medicine | Admitting: Internal Medicine

## 2023-12-06 ENCOUNTER — Ambulatory Visit: Payer: 59

## 2023-12-06 DIAGNOSIS — Z66 Do not resuscitate: Secondary | ICD-10-CM | POA: Diagnosis present

## 2023-12-06 DIAGNOSIS — J449 Chronic obstructive pulmonary disease, unspecified: Secondary | ICD-10-CM | POA: Diagnosis not present

## 2023-12-06 DIAGNOSIS — Z515 Encounter for palliative care: Secondary | ICD-10-CM

## 2023-12-06 DIAGNOSIS — J441 Chronic obstructive pulmonary disease with (acute) exacerbation: Secondary | ICD-10-CM | POA: Diagnosis present

## 2023-12-06 DIAGNOSIS — C787 Secondary malignant neoplasm of liver and intrahepatic bile duct: Secondary | ICD-10-CM | POA: Diagnosis present

## 2023-12-06 DIAGNOSIS — Z791 Long term (current) use of non-steroidal anti-inflammatories (NSAID): Secondary | ICD-10-CM

## 2023-12-06 DIAGNOSIS — D509 Iron deficiency anemia, unspecified: Secondary | ICD-10-CM | POA: Diagnosis present

## 2023-12-06 DIAGNOSIS — Z6823 Body mass index (BMI) 23.0-23.9, adult: Secondary | ICD-10-CM

## 2023-12-06 DIAGNOSIS — I119 Hypertensive heart disease without heart failure: Secondary | ICD-10-CM | POA: Diagnosis present

## 2023-12-06 DIAGNOSIS — J189 Pneumonia, unspecified organism: Secondary | ICD-10-CM

## 2023-12-06 DIAGNOSIS — C7931 Secondary malignant neoplasm of brain: Secondary | ICD-10-CM

## 2023-12-06 DIAGNOSIS — Z7982 Long term (current) use of aspirin: Secondary | ICD-10-CM

## 2023-12-06 DIAGNOSIS — Z7952 Long term (current) use of systemic steroids: Secondary | ICD-10-CM

## 2023-12-06 DIAGNOSIS — Z85118 Personal history of other malignant neoplasm of bronchus and lung: Secondary | ICD-10-CM

## 2023-12-06 DIAGNOSIS — Z8269 Family history of other diseases of the musculoskeletal system and connective tissue: Secondary | ICD-10-CM

## 2023-12-06 DIAGNOSIS — Z9221 Personal history of antineoplastic chemotherapy: Secondary | ICD-10-CM

## 2023-12-06 DIAGNOSIS — J9611 Chronic respiratory failure with hypoxia: Secondary | ICD-10-CM

## 2023-12-06 DIAGNOSIS — E872 Acidosis, unspecified: Secondary | ICD-10-CM | POA: Diagnosis present

## 2023-12-06 DIAGNOSIS — Z803 Family history of malignant neoplasm of breast: Secondary | ICD-10-CM

## 2023-12-06 DIAGNOSIS — J9612 Chronic respiratory failure with hypercapnia: Secondary | ICD-10-CM

## 2023-12-06 DIAGNOSIS — F32A Depression, unspecified: Secondary | ICD-10-CM | POA: Diagnosis present

## 2023-12-06 DIAGNOSIS — F419 Anxiety disorder, unspecified: Secondary | ICD-10-CM | POA: Diagnosis present

## 2023-12-06 DIAGNOSIS — A419 Sepsis, unspecified organism: Principal | ICD-10-CM | POA: Diagnosis present

## 2023-12-06 DIAGNOSIS — D573 Sickle-cell trait: Secondary | ICD-10-CM | POA: Diagnosis present

## 2023-12-06 DIAGNOSIS — Z8249 Family history of ischemic heart disease and other diseases of the circulatory system: Secondary | ICD-10-CM

## 2023-12-06 DIAGNOSIS — Z923 Personal history of irradiation: Secondary | ICD-10-CM

## 2023-12-06 DIAGNOSIS — C3490 Malignant neoplasm of unspecified part of unspecified bronchus or lung: Secondary | ICD-10-CM

## 2023-12-06 DIAGNOSIS — R627 Adult failure to thrive: Secondary | ICD-10-CM | POA: Diagnosis present

## 2023-12-06 DIAGNOSIS — E785 Hyperlipidemia, unspecified: Secondary | ICD-10-CM

## 2023-12-06 DIAGNOSIS — E876 Hypokalemia: Secondary | ICD-10-CM | POA: Diagnosis not present

## 2023-12-06 DIAGNOSIS — J440 Chronic obstructive pulmonary disease with acute lower respiratory infection: Secondary | ICD-10-CM | POA: Diagnosis present

## 2023-12-06 DIAGNOSIS — Z853 Personal history of malignant neoplasm of breast: Secondary | ICD-10-CM

## 2023-12-06 DIAGNOSIS — Z83438 Family history of other disorder of lipoprotein metabolism and other lipidemia: Secondary | ICD-10-CM

## 2023-12-06 DIAGNOSIS — R051 Acute cough: Principal | ICD-10-CM

## 2023-12-06 DIAGNOSIS — D630 Anemia in neoplastic disease: Secondary | ICD-10-CM | POA: Diagnosis present

## 2023-12-06 DIAGNOSIS — R6510 Systemic inflammatory response syndrome (SIRS) of non-infectious origin without acute organ dysfunction: Secondary | ICD-10-CM

## 2023-12-06 DIAGNOSIS — Z90710 Acquired absence of both cervix and uterus: Secondary | ICD-10-CM

## 2023-12-06 DIAGNOSIS — Z825 Family history of asthma and other chronic lower respiratory diseases: Secondary | ICD-10-CM

## 2023-12-06 DIAGNOSIS — F418 Other specified anxiety disorders: Secondary | ICD-10-CM

## 2023-12-06 DIAGNOSIS — T380X5A Adverse effect of glucocorticoids and synthetic analogues, initial encounter: Secondary | ICD-10-CM | POA: Diagnosis present

## 2023-12-06 DIAGNOSIS — Z79899 Other long term (current) drug therapy: Secondary | ICD-10-CM

## 2023-12-06 DIAGNOSIS — R531 Weakness: Secondary | ICD-10-CM

## 2023-12-06 DIAGNOSIS — R6520 Severe sepsis without septic shock: Secondary | ICD-10-CM | POA: Diagnosis present

## 2023-12-06 DIAGNOSIS — D869 Sarcoidosis, unspecified: Secondary | ICD-10-CM | POA: Diagnosis present

## 2023-12-06 DIAGNOSIS — J9621 Acute and chronic respiratory failure with hypoxia: Secondary | ICD-10-CM | POA: Diagnosis not present

## 2023-12-06 DIAGNOSIS — D638 Anemia in other chronic diseases classified elsewhere: Secondary | ICD-10-CM

## 2023-12-06 DIAGNOSIS — K219 Gastro-esophageal reflux disease without esophagitis: Secondary | ICD-10-CM | POA: Diagnosis present

## 2023-12-06 DIAGNOSIS — Z1152 Encounter for screening for COVID-19: Secondary | ICD-10-CM

## 2023-12-06 DIAGNOSIS — Y95 Nosocomial condition: Secondary | ICD-10-CM | POA: Diagnosis present

## 2023-12-06 DIAGNOSIS — Z87891 Personal history of nicotine dependence: Secondary | ICD-10-CM

## 2023-12-06 LAB — CBC
HCT: 31.2 % — ABNORMAL LOW (ref 36.0–46.0)
Hemoglobin: 9.7 g/dL — ABNORMAL LOW (ref 12.0–15.0)
MCH: 28 pg (ref 26.0–34.0)
MCHC: 31.1 g/dL (ref 30.0–36.0)
MCV: 90.2 fL (ref 80.0–100.0)
Platelets: 256 10*3/uL (ref 150–400)
RBC: 3.46 MIL/uL — ABNORMAL LOW (ref 3.87–5.11)
RDW: 14.7 % (ref 11.5–15.5)
WBC: 24.8 10*3/uL — ABNORMAL HIGH (ref 4.0–10.5)
nRBC: 0 % (ref 0.0–0.2)

## 2023-12-06 LAB — LACTIC ACID, PLASMA: Lactic Acid, Venous: 4.9 mmol/L (ref 0.5–1.9)

## 2023-12-06 LAB — BASIC METABOLIC PANEL
Anion gap: 11 (ref 5–15)
BUN: 9 mg/dL (ref 8–23)
CO2: 34 mmol/L — ABNORMAL HIGH (ref 22–32)
Calcium: 8.6 mg/dL — ABNORMAL LOW (ref 8.9–10.3)
Chloride: 97 mmol/L — ABNORMAL LOW (ref 98–111)
Creatinine, Ser: 0.83 mg/dL (ref 0.44–1.00)
GFR, Estimated: >60 mL/min (ref >60)
Glucose, Bld: 91 mg/dL (ref 70–99)
Potassium: 3.5 mmol/L (ref 3.5–5.1)
Sodium: 142 mmol/L (ref 135–145)

## 2023-12-06 LAB — RESP PANEL BY RT-PCR (RSV, FLU A&B, COVID)  RVPGX2
Influenza A by PCR: NEGATIVE
Influenza B by PCR: NEGATIVE
Resp Syncytial Virus by PCR: NEGATIVE
SARS Coronavirus 2 by RT PCR: NEGATIVE

## 2023-12-06 LAB — PROCALCITONIN: Procalcitonin: 0.88 ng/mL

## 2023-12-06 MED ORDER — ATORVASTATIN CALCIUM 10 MG PO TABS
20.0000 mg | ORAL_TABLET | Freq: Every day | ORAL | Status: DC
  Administered 2023-12-06 – 2023-12-14 (×9): 20 mg via ORAL
  Filled 2023-12-06 (×9): qty 2

## 2023-12-06 MED ORDER — ACETAMINOPHEN 325 MG PO TABS: 650.0000 mg | ORAL_TABLET | Freq: Four times a day (QID) | ORAL | Status: DC | PRN

## 2023-12-06 MED ORDER — VANCOMYCIN HCL 1250 MG/250ML IV SOLN
1250.0000 mg | INTRAVENOUS | Status: DC
  Administered 2023-12-07 – 2023-12-08 (×2): 1250 mg via INTRAVENOUS
  Filled 2023-12-06 (×2): qty 250

## 2023-12-06 MED ORDER — SODIUM CHLORIDE 0.9 % IV BOLUS
500.0000 mL | Freq: Once | INTRAVENOUS | Status: AC
  Administered 2023-12-06: 500 mL via INTRAVENOUS

## 2023-12-06 MED ORDER — ENOXAPARIN SODIUM 40 MG/0.4ML IJ SOSY
40.0000 mg | PREFILLED_SYRINGE | INTRAMUSCULAR | Status: DC
  Administered 2023-12-06 – 2023-12-13 (×7): 40 mg via SUBCUTANEOUS
  Filled 2023-12-06 (×8): qty 0.4

## 2023-12-06 MED ORDER — VANCOMYCIN HCL 1500 MG/300ML IV SOLN
1500.0000 mg | Freq: Once | INTRAVENOUS | Status: AC
  Administered 2023-12-06: 1500 mg via INTRAVENOUS
  Filled 2023-12-06 (×2): qty 300

## 2023-12-06 MED ORDER — IPRATROPIUM-ALBUTEROL 0.5-2.5 (3) MG/3ML IN SOLN
3.0000 mL | Freq: Once | RESPIRATORY_TRACT | Status: AC
  Administered 2023-12-06: 3 mL via RESPIRATORY_TRACT
  Filled 2023-12-06: qty 3

## 2023-12-06 MED ORDER — IPRATROPIUM-ALBUTEROL 0.5-2.5 (3) MG/3ML IN SOLN
3.0000 mL | Freq: Four times a day (QID) | RESPIRATORY_TRACT | Status: DC
  Administered 2023-12-06 – 2023-12-07 (×2): 3 mL via RESPIRATORY_TRACT
  Filled 2023-12-06 (×3): qty 3

## 2023-12-06 MED ORDER — METOPROLOL TARTRATE 25 MG PO TABS
25.0000 mg | ORAL_TABLET | Freq: Every day | ORAL | Status: DC
  Administered 2023-12-06 – 2023-12-14 (×9): 25 mg via ORAL
  Filled 2023-12-06 (×9): qty 1

## 2023-12-06 MED ORDER — PREDNISONE 20 MG PO TABS
60.0000 mg | ORAL_TABLET | Freq: Once | ORAL | Status: AC
  Administered 2023-12-06: 60 mg via ORAL
  Filled 2023-12-06: qty 3

## 2023-12-06 MED ORDER — ALBUTEROL SULFATE (2.5 MG/3ML) 0.083% IN NEBU
2.5000 mg | INHALATION_SOLUTION | RESPIRATORY_TRACT | Status: DC | PRN
  Administered 2023-12-09: 2.5 mg via RESPIRATORY_TRACT

## 2023-12-06 MED ORDER — SERTRALINE HCL 50 MG PO TABS
50.0000 mg | ORAL_TABLET | Freq: Every day | ORAL | Status: DC
  Administered 2023-12-06 – 2023-12-14 (×9): 50 mg via ORAL
  Filled 2023-12-06 (×10): qty 1

## 2023-12-06 MED ORDER — PANTOPRAZOLE SODIUM 40 MG PO TBEC
40.0000 mg | DELAYED_RELEASE_TABLET | Freq: Every day | ORAL | Status: DC
  Administered 2023-12-06 – 2023-12-13 (×8): 40 mg via ORAL
  Filled 2023-12-06 (×8): qty 1

## 2023-12-06 MED ORDER — LEVETIRACETAM 750 MG PO TABS
750.0000 mg | ORAL_TABLET | Freq: Every day | ORAL | Status: DC
  Administered 2023-12-06 – 2023-12-14 (×9): 750 mg via ORAL
  Filled 2023-12-06 (×9): qty 1

## 2023-12-06 MED ORDER — HYDROCODONE-ACETAMINOPHEN 5-325 MG PO TABS: 1.0000 | ORAL_TABLET | Freq: Four times a day (QID) | ORAL | Status: DC | PRN

## 2023-12-06 MED ORDER — ACETAMINOPHEN 650 MG RE SUPP: 650.0000 mg | Freq: Four times a day (QID) | RECTAL | Status: DC | PRN

## 2023-12-06 MED ORDER — PREDNISONE 20 MG PO TABS
40.0000 mg | ORAL_TABLET | Freq: Every day | ORAL | Status: DC
  Administered 2023-12-07 – 2023-12-09 (×3): 40 mg via ORAL
  Filled 2023-12-06 (×3): qty 2

## 2023-12-06 MED ORDER — BUDESONIDE 0.5 MG/2ML IN SUSP
0.5000 mg | Freq: Two times a day (BID) | RESPIRATORY_TRACT | Status: DC
  Administered 2023-12-07 – 2023-12-14 (×14): 0.5 mg via RESPIRATORY_TRACT
  Filled 2023-12-06 (×16): qty 2

## 2023-12-06 MED ORDER — ASPIRIN 81 MG PO CHEW
81.0000 mg | CHEWABLE_TABLET | Freq: Every day | ORAL | Status: DC
  Administered 2023-12-06 – 2023-12-14 (×9): 81 mg via ORAL
  Filled 2023-12-06 (×9): qty 1

## 2023-12-06 MED ORDER — SODIUM CHLORIDE 0.9 % IV SOLN
1.0000 g | Freq: Three times a day (TID) | INTRAVENOUS | Status: DC
  Administered 2023-12-06 – 2023-12-09 (×8): 1 g via INTRAVENOUS
  Filled 2023-12-06 (×10): qty 10

## 2023-12-06 MED ORDER — GUAIFENESIN ER 600 MG PO TB12
600.0000 mg | ORAL_TABLET | Freq: Two times a day (BID) | ORAL | Status: DC
  Administered 2023-12-06 – 2023-12-14 (×16): 600 mg via ORAL
  Filled 2023-12-06 (×16): qty 1

## 2023-12-06 MED ORDER — ARFORMOTEROL TARTRATE 15 MCG/2ML IN NEBU
15.0000 ug | INHALATION_SOLUTION | Freq: Two times a day (BID) | RESPIRATORY_TRACT | Status: DC
  Administered 2023-12-07 – 2023-12-14 (×14): 15 ug via RESPIRATORY_TRACT
  Filled 2023-12-06 (×16): qty 2

## 2023-12-06 MED ORDER — SODIUM CHLORIDE 0.9% FLUSH
3.0000 mL | Freq: Two times a day (BID) | INTRAVENOUS | Status: DC
  Administered 2023-12-06 – 2023-12-14 (×13): 3 mL via INTRAVENOUS

## 2023-12-06 MED ORDER — MONTELUKAST SODIUM 10 MG PO TABS
10.0000 mg | ORAL_TABLET | Freq: Every day | ORAL | Status: DC
  Administered 2023-12-06 – 2023-12-13 (×8): 10 mg via ORAL
  Filled 2023-12-06 (×8): qty 1

## 2023-12-06 NOTE — Discharge Planning (Signed)
Pt active with [facility]

## 2023-12-06 NOTE — ED Notes (Signed)
Patient transported to MRI 

## 2023-12-06 NOTE — Progress Notes (Signed)
PHARMACY ANTIBIOTIC CONSULT NOTE   [name] a 63 y.o. female p/w AoC resp failure. Coming from [facility]. Pharmacy has been consulted for Vancomycin dosing.  1/23: Scr 0.83, WBC 24.8   Estimated Creatinine Clearance: 62.4 mL/min (by C-G formula based on SCr of 0.83 mg/dL).  Plan: Cefepime 1 g IV Q8H per MD  GIVE Vancomycin 1,500 mg IV x1 THEN Vancomycin 1250 mg IV Q24H (Scr used: 0.83, Vd used: 0.72, eAUC: 475) Monitor renal function, clinical status, de-escalation, C/S, levels as indicated  F/U MRSA PCR   Allergies:  Allergies  Allergen Reactions   Codeine Other (See Comments)    Avoids-felt bad when taken before Pt claims she can have this but does not like to take    There were no vitals filed for this visit.     Latest Ref Rng & Units 12/06/2023    7:48 AM 11/27/2023   11:09 AM 11/24/2023    3:35 PM  CBC  WBC 4.0 - 10.5 K/uL 24.8  18.5  15.6   Hemoglobin 12.0 - 15.0 g/dL 9.7  7.8  7.1   Hematocrit 36.0 - 46.0 % 31.2  24.2  22.6   Platelets 150 - 400 K/uL 256  246  197     Antibiotics Given (last 72 hours)     None       Antimicrobials this admission: Vanc 1/23>> CFP 1/23>>   Microbiology results: 4-plex PCR negative 1/23 MRSA PCR: sent   Thank you for allowing pharmacy to be a part of this patient's care.  Jani Gravel, PharmD Clinical Pharmacist  12/06/2023 7:36 PM

## 2023-12-06 NOTE — ED Provider Notes (Signed)
[place] EMERGENCY DEPARTMENT AT [facility] Provider Note  CSN: [id number] Arrival date & time: 12/06/23 4098  Chief Complaint(s) Shortness of Breath, Cough, and Chest Pain  HPI [name] is a 63 y.o. female here today for shortness of breath.  Patient has a history of COPD, is chronically on 2 L.  Has had cough for 2 days.  Recently finished prednisone.  Denies fever or chills.  Patient with a history of metastatic lung cancer, currently receiving treatment.  Last received treatment 1 week ago.   Past Medical History Past Medical History:  Diagnosis Date   Acute on chronic respiratory failure (HCC) 10/09/2016   Allergic rhinitis    Breast cancer (HCC) 2016   right breast   COPD, mild (HCC) FOLLOWED BY DR JXBJ   Depression    GERD (gastroesophageal reflux disease)    HTN (hypertension)    Hypertension    Iron deficiency anemia    Long-term current use of steroids SYMBICORT INHALER   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    Overweight (BMI 25.0-29.9) 05/11/2016   Palpitations    Personal history of radiation therapy 2016   Sarcoidosis STABLE PER CXR JUNE 2013   Shortness of breath    Sickle cell trait [facility])    Patient Active Problem List   Diagnosis Date Noted   FTT (failure to thrive) in adult 11/14/2023   QT prolongation 11/14/2023   Generalized body aches 11/13/2023   Sepsis due to pneumonia (HCC) 11/03/2023   Weakness 10/13/2023   Thrombocytopenia (HCC) 10/13/2023   Malignant neoplasm of right lung (HCC) 10/13/2023   Sepsis (HCC) 10/13/2023   Fever and neutropenia (HCC) 10/12/2023   Goals of care, counseling/discussion 09/27/2023   Acute metabolic encephalopathy 09/17/2023   Cerebral edema (HCC) 08/31/2023   Acute metabolic encephalopathy 08/28/2023   Community acquired pneumonia of right upper lobe of lung 08/28/2023   AKI (acute kidney injury) (HCC) 08/27/2023   Lung cancer metastatic to brain (HCC) 08/08/2023   Port-A-Cath in place 08/07/2023   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   Squamous cell lung cancer, 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   Frontal mass of brain 06/26/2023   Mass of upper lobe of right lung 06/26/2023   DNR (do not resuscitate) 06/26/2023   Cirrhosis (HCC) 03/14/2022   Benign neoplasm of ascending colon    Anemia 01/10/2019   Chronic cough 11/26/2017   Depression    Obesity (BMI 30-39.9) 01/10/2017   Dyspnea on exertion 11/09/2016   Chronic respiratory failure with hypoxia and hypercapnia (HCC) 10/29/2016   Depression with anxiety 08/07/2016   Hot flashes 02/01/2016   Hypokalemia    Breast cancer of upper-outer quadrant of right female breast (HCC) 08/04/2015   GERD (gastroesophageal reflux disease) 12/21/2012   Hyperlipidemia 12/21/2012   Solitary pulmonary nodule on lung CT 12/21/2012   SICKLE-CELL TRAIT 04/15/2008   Sarcoidosis (HCC) 01/10/2007   Hypertension 01/10/2007   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 Authorizing Provider  albuterol (VENTOLIN HFA) 108 (90 Base) MCG/ACT inhaler INHALE 2 PUFFS EVERY 6 HOURS AS NEEDED FOR WHEEZING OR SHORTNESS OF BREATH 09/23/19  Yes Nyoka Cowden, MD  aspirin 81 MG chewable tablet Chew 1 tablet (81 mg total) by mouth daily. 09/22/23  Yes Swayze, Ava, DO  atorvastatin (LIPITOR) 20 MG tablet TAKE 1 TABLET (20  MG TOTAL) BY MOUTH DAILY. 03/28/21  Yes Sandre Kitty, MD  HYDROcodone-acetaminophen (NORCO) 5-325 MG tablet Take 1 tablet by mouth every 6 (six) hours as needed for moderate pain (pain score 4-6). 11/19/23  Yes Pickenpack-Cousar, Arty Baumgartner, NP  levETIRAcetam (KEPPRA) 750 MG tablet Take 1 tablet (750 mg total) by mouth 2 (two) times daily. Patient taking differently: Take 750 mg by mouth daily. 09/21/23  Yes Swayze, Ava, DO  MAGNESIUM-OXIDE 400 (240 Mg) MG tablet Take 1 tablet by mouth every  morning. 10/19/23  Yes [provider]  meloxicam (MOBIC) 7.5 MG tablet Take 1 tablet (7.5 mg total) by mouth daily. 11/19/23  Yes Pickenpack-Cousar, Arty Baumgartner, NP  metoprolol tartrate (LOPRESSOR) 25 MG tablet Take 1 tablet (25 mg total) by mouth daily. 07/09/23  Yes Leslye Peer, MD  montelukast (SINGULAIR) 10 MG tablet Take 10 mg by mouth at bedtime. 07/31/22  Yes [provider]  Multiple Vitamin (MULTIVITAMIN) capsule Take 1 capsule by mouth daily with breakfast.   Yes [provider]  omeprazole (PRILOSEC) 20 MG capsule TAKE 1 CAPSULE EVERY DAY Patient taking differently: Take 20 mg by mouth daily before breakfast. 01/03/21  Yes Sandre Kitty, MD  OXYGEN Inhale 2-3 L/min into the lungs See admin instructions. Inhale 2-3 L/min into the lungs CONTINUOUSLY; 2 L/min at rest and 3 L/min if exerted   Yes [provider]  PREDNISONE PO Take 1 tablet by mouth daily with breakfast.   Yes [provider]  sertraline (ZOLOFT) 50 MG tablet TAKE 1 TABLET EVERY DAY 09/11/20  Yes Sandre Kitty, MD  STIOLTO RESPIMAT 2.5-2.5 MCG/ACT AERS USE 2 INHALATIONS BY MOUTH DAILY 09/17/23  Yes Nyoka Cowden, MD  baclofen (LIORESAL) 10 MG tablet Take 1 tablet (10 mg total) by mouth 3 (three) times daily. Patient not taking: Reported on 12/06/2023 11/19/23   Pickenpack-Cousar, Arty Baumgartner, NP  dexamethasone (DECADRON) 4 MG tablet Take 1 tablet (4 mg total) by mouth 2 (two) times daily. Patient not taking: Reported on 12/06/2023 10/19/23   Dorcas Carrow, MD  potassium chloride SA (KLOR-CON M) 20 MEQ tablet Take 1 tablet (20 mEq total) by mouth daily for 14 days. 11/09/23 11/23/23  Glade Lloyd, MD  triamterene-hydrochlorothiazide (MAXZIDE-25) 37.5-25 MG tablet Take 1 tablet by mouth daily. Patient not taking: Reported on 12/06/2023    [provider]                                                                                                                                     Past Surgical History Past Surgical History:  Procedure Laterality Date   ADJUSTABLE SUTURE MANIPULATION  05/22/2012   Procedure: ADJUSTABLE SUTURE MANIPULATION;  Surgeon: Corinda Gubler, MD;  Location: [facility];  Service: Ophthalmology;  Laterality: Right;   BREAST LUMPECTOMY Right 2016   BRONCHIAL BIOPSY  07/09/2023   Procedure: BRONCHIAL BIOPSIES;  Surgeon: [name],  Les Pou, MD;  Location: [facility] ENDOSCOPY;  Service: Pulmonary;;   BRONCHIAL BRUSHINGS  07/09/2023   Procedure: BRONCHIAL BRUSHINGS;  Surgeon: Leslye Peer, MD;  Location: [facility] ENDOSCOPY;  Service: Pulmonary;;   BRONCHIAL NEEDLE ASPIRATION BIOPSY  07/09/2023   Procedure: BRONCHIAL NEEDLE ASPIRATION BIOPSIES;  Surgeon: Leslye Peer, MD;  Location: [facility] ENDOSCOPY;  Service: Pulmonary;;   CESAREAN SECTION  1986   W/ BILATERAL TUBAL LIGATION   COLONOSCOPY WITH PROPOFOL N/A 02/21/2019   Procedure: COLONOSCOPY WITH PROPOFOL;  Surgeon: Hilarie Fredrickson, MD;  Location: [facility] ENDOSCOPY;  Service: Endoscopy;  Laterality: N/A;   EYE SURGERY     IR IMAGING GUIDED PORT INSERTION  08/01/2023   MEDIAN RECTUS REPAIR  05/22/2012   Procedure: MEDIAN RECTUS REPAIR;  Surgeon: Corinda Gubler, MD;  Location: [facility];  Service: Ophthalmology;  Laterality: Bilateral;  INFERIOR RECTUS RESECTION WITH ADJUSTIBLE SUTURES RIGHT EYE    POLYPECTOMY  02/21/2019   Procedure: POLYPECTOMY;  Surgeon: Hilarie Fredrickson, MD;  Location: [facility] ENDOSCOPY;  Service: Endoscopy;;   RADIOACTIVE SEED GUIDED PARTIAL MASTECTOMY WITH AXILLARY SENTINEL LYMPH NODE BIOPSY Right 08/18/2015   Procedure: RADIOACTIVE SEED GUIDED PARTIAL MASTECTOMY WITH AXILLARY SENTINEL LYMPH NODE BIOPSY;  Surgeon: Almond Lint, MD;  Location: [facility];  Service: General;  Laterality: Right;   TOTAL ROBOTIC ASSISTED LAPAROSCOPIC HYSTERECTOMY  12-30-2010   SYMPTOMATIC UTERINE FIBROIDS   UPPER TEETH EXTRACTION'S  1992   VIDEO BRONCHOSCOPY WITH RADIAL  ENDOBRONCHIAL ULTRASOUND  07/09/2023   Procedure: VIDEO BRONCHOSCOPY WITH RADIAL ENDOBRONCHIAL ULTRASOUND;  Surgeon: Leslye Peer, MD;  Location: [facility] ENDOSCOPY;  Service: Pulmonary;;   Family History Family History  Problem Relation Age of Onset   Hyperlipidemia Mother    Asthma Mother    Lupus Mother    Breast cancer Mother 54   Hypertension Father    Hyperlipidemia Father    Hypertension Sister    Hypertension Brother    Cancer Neg Hx    Diabetes Neg Hx    Coronary artery disease Neg Hx     Social History Social History   Tobacco Use   Smoking status: Former    Current packs/day: 0.00    Average packs/day: 1 pack/day for 20.0 years (20.0 ttl pk-yrs)    Types: Cigarettes    Start date: 11/14/1983    Quit date: 11/14/2003    Years since quitting: 20.0   Smokeless tobacco: Never  Vaping Use   Vaping status: Never Used  Substance Use Topics   Alcohol use: No   Drug use: No   Allergies Codeine  Review of Systems Review of Systems  Physical Exam Vital Signs  I have reviewed the triage vital signs BP 135/80   Pulse 91   Temp 98.7 F (37.1 C) (Oral)   Resp 17   SpO2 100%   Physical Exam Vitals reviewed.  Eyes:     Pupils: Pupils are equal, round, and reactive to light.  Cardiovascular:     Rate and Rhythm: Normal rate.  Pulmonary:     Effort: Pulmonary effort is normal.     Comments: Coarse breath sounds throughout all lung fields.  No wheezing Neurological:     Mental Status: She is alert.     ED Results and Treatments Labs (all labs ordered are listed, but only abnormal results are displayed) Labs Reviewed  BASIC METABOLIC PANEL - Abnormal; Notable for the following components:      Result Value   Chloride 97 (*)  CO2 34 (*)    Calcium 8.6 (*)    All other components within normal limits  CBC - Abnormal; Notable for the following components:   WBC 24.8 (*)    RBC 3.46 (*)    Hemoglobin 9.7 (*)    HCT 31.2 (*)    All other components within  normal limits  RESP PANEL BY RT-PCR (RSV, FLU A&B, COVID)  [id number]                                                                                                                          Radiology MR BRAIN WO CONTRAST Result Date: 12/06/2023 CLINICAL DATA:  Provided history: Stroke, follow-up. EXAM: MRI HEAD WITHOUT CONTRAST TECHNIQUE: Multiplanar, multiecho pulse sequences of the brain and surrounding structures were obtained without intravenous contrast. COMPARISON:  Prior head CT examinations 12/06/2023 and earlier. Brain MRI 09/17/2023. FINDINGS: Please note, there is limited reassessment of the patient's known intracranial metastatic disease, and limited assessment for new intracranial metastases, on this non-contrast brain MRI. Brain: No age-advanced or lobar predominant parenchymal atrophy. Area of parenchymal edema centered in the left basal ganglia and insular region, measuring 4.8 x 3.3 cm in transaxial dimensions. This is new from the prior brain MRI of 09/17/2023 and highly suspicious for the presence of a new metastasis at this site. Specifically, there is a suspected new 11 mm partially cystic metastasis centrally within this region of edema (series 5, image 13). 1.4 cm focus of parenchymal edema within the dorsal right pons, new from the prior brain MRI and highly suspicious for the presence of a new metastasis at this site (series 5, image 8) (series 6, image 8). Small region of cortical/subcortical T2 FLAIR hyperintense signal abnormality within the lateral left temporal lobe at site of a known metastasis. Region of cortical/subcortical edema within the left occipital lobe spanning 4 cm, in the region of a known left occipital metastasis. Chronic blood products also present at this site (series 7, image 11). No edema persists at site of a previously demonstrated metastasis within the right parietal lobe (series 6, image 21). Minimal residual T2 FLAIR hyperintense signal abnormality within the  lateral left cerebellar hemisphere, at site of a previously demonstrated mass (series 6, image 6). Mild multifocal T2 FLAIR hyperintense signal abnormality elsewhere within the cerebral white matter and pons, nonspecific but compatible with chronic small vessel ischemic disease. Partially empty sella turcica. There is no acute infarct. No extra-axial fluid collection. No midline shift. Vascular: Maintained flow voids within the proximal large arterial vessels. Skull and upper cervical spine: No focal worrisome marrow lesion. Sinuses/Orbits: No mass or acute finding within the imaged orbits. Prior bilateral ocular lens replacement. Small mucous retention cyst within the left maxillary sinus. Other: Trace fluid within the bilateral mastoid air cells. IMPRESSION: 1. Please note, there is limited reassessment of the patient's known intracranial metastatic disease, and limited assessment for new intracranial metastases, on this non-contrast brain MRI. 2. Foci of signal abnormality  within the left basal ganglia/insular region and dorsal right pons, new from the prior brain MRI of 09/17/2023 and suspected to reflect sites of new metastases. Post-contrast MR imaging of the brain recommended for further evaluation. 3. Parenchymal edema persists at sites of some of the patient's previously demonstrated metastases, as described. Post-contrast MR imaging of the brain recommended for further evaluation. 4. Background mild chronic small vessel ischemic disease. Electronically Signed   By: Jackey Loge D.O.   On: 12/06/2023 13:00   CT Head Wo Contrast Result Date: 12/06/2023 CLINICAL DATA:  Delirium EXAM: CT HEAD WITHOUT CONTRAST TECHNIQUE: Contiguous axial images were obtained from the base of the skull through the vertex without intravenous contrast. RADIATION DOSE REDUCTION: This exam was performed according to the departmental dose-optimization program which includes automated exposure control, adjustment of the mA and/or  kV according to patient size and/or use of iterative reconstruction technique. COMPARISON:  Head CT 10/20/2023 FINDINGS: Brain: No hemorrhage. No hydrocephalus. No extra-axial fluid collection. No mass effect. No mass lesion. Possible acute to subacute infarct in the left basal ganglia, new/evolved compared to 10/20/2023. Vascular: No hyperdense vessel or unexpected calcification. Skull: Normal. Negative for fracture or focal lesion. Sinuses/Orbits: No middle ear mastoid effusion. Paranasal sinuses are clear. Bilateral lens replacement. Orbits are otherwise unremarkable. Other: None. IMPRESSION: Possible acute to subacute infarct in the left basal ganglia, new/evolved compared to 10/20/2023. Consider further evaluation with a brain MRI. Electronically Signed   By: Lorenza Cambridge M.D.   On: 12/06/2023 10:30   DG Chest 2 View Result Date: 12/06/2023 CLINICAL DATA:  SOb  SOB, cough, chest pain EXAM: CHEST - 2 VIEW COMPARISON:  CT angio chest 11/03/2023, CT abdomen pelvis 11/24/2023, chest x-ray 11/13/2023 FINDINGS: Right chest wall Port-A-Cath with tip overlying the expected region of distal superior vena cava. The heart and mediastinal contours are within normal limits. Hyperinflation of the lungs with slightly coarsened interstitial markings. Right upper lobe scarring and architectural distortion with persistent right upper lung zone airspace opacity with associated cavitary lesion. Right middle lobe architectural distortion and scarring again noted. No pulmonary edema. Flattening and blunting of bilateral costophrenic angles with trace pleural effusions. No pneumothorax. No acute osseous abnormality. Right chest wall/axillary surgical clips/vascular clips. IMPRESSION: 1. Persistent right upper lung zone airspace opacity with associated cavitary lesion. 2. Bilateral trace pleural effusions. 3. Aortic Atherosclerosis (ICD10-I70.0) and Emphysema (ICD10-J43.9). Electronically Signed   By: Tish Frederickson M.D.   On:  12/06/2023 08:09    Pertinent labs & imaging results that were available during my care of the patient were reviewed by me and considered in my medical decision making (see MDM for details).  Medications Ordered in ED Medications  ipratropium-albuterol (DUONEB) 0.5-2.5 (3) MG/3ML nebulizer solution 3 mL (has no administration in time range)  predniSONE (DELTASONE) tablet 60 mg (has no administration in time range)  Procedures Procedures  (including critical care time)  Medical Decision Making / ED Course   This patient presents to the ED for concern of cough, shortness of breath, this involves an extensive number of treatment options, and is a complaint that carries with it a high risk of complications and morbidity.  The differential diagnosis includes pneumonia, COPD exacerbation, pulmonary edema, less likely PE, less likely ACS, viral syndrome, metastatic cancer, electrolyte abnormalities.  MDM: On exam, patient does appear quite rundown.  Could be component of viral syndrome plan overall, does have history of COPD.  Patient is on additional supplemental O2.  Chest x-ray done at triage does not show obvious pneumonia.  Patient still pending CBC.  Patient with known metastatic cancer to the brain, will repeat CT imaging of the head to assess for possible intracranial cause of patient's somnolence.  Suspicion is this is likely worsening of her chronic COPD, possibly exacerbated with viral syndrome.  Recent chemotherapy could also be contributing, will check BMP.  Reassessment 1:15 PM-patient CT imaging showed question of infarct versus malignancy.  Obtained MRI which did show likely new area of metastases.  Patient was much more alert now, likely was just sleepy earlier in the morning.  Patient is to require additional O2.  Have ordered a DuoNeb, prednisone for  the patient.  Will plan to admit.  Patient currently living at home with son.  As disease burden increases, believe she would likely require assisted living or at home care going forward.   Additional history obtained: -Additional history obtained from EMS -External records from outside source obtained and reviewed including: Chart review including previous notes, labs, imaging, consultation notes   Lab Tests: -I ordered, reviewed, and interpreted labs.   The pertinent results include:   Labs Reviewed  BASIC METABOLIC PANEL - Abnormal; Notable for the following components:      Result Value   Chloride 97 (*)    CO2 34 (*)    Calcium 8.6 (*)    All other components within normal limits  CBC - Abnormal; Notable for the following components:   WBC 24.8 (*)    RBC 3.46 (*)    Hemoglobin 9.7 (*)    HCT 31.2 (*)    All other components within normal limits  RESP PANEL BY RT-PCR (RSV, FLU A&B, COVID)  [id number]      EKG my independent review the patient's EKG shows no ST segment depressions or elevations, no T wave versions, no evidence of acute ischemia.  EKG Interpretation Date/Time:    Ventricular Rate:    PR Interval:    QRS Duration:    QT Interval:    QTC Calculation:   R Axis:      Text Interpretation:           Imaging Studies ordered: I ordered imaging studies including CT imaging of the head, chest x-ray, MRI brain I independently visualized and interpreted imaging. I agree with the radiologist interpretation   Medicines ordered and prescription drug management: Meds ordered this encounter  Medications   ipratropium-albuterol (DUONEB) 0.5-2.5 (3) MG/3ML nebulizer solution 3 mL   predniSONE (DELTASONE) tablet 60 mg    -I have reviewed the patients home medicines and have made adjustments as needed   Cardiac Monitoring: The patient was maintained on a cardiac monitor.  I personally viewed and interpreted the cardiac monitored which showed an underlying  rhythm of: Normal sinus rhythm  Social Determinants of Health:  Factors impacting patients care include: Multiple medical  comorbidities including metastatic cancer   Reevaluation: After the interventions noted above, I reevaluated the patient and found that they have :improved  Co morbidities that complicate the patient evaluation  Past Medical History:  Diagnosis Date   Acute on chronic respiratory failure (HCC) 10/09/2016   Allergic rhinitis    Breast cancer (HCC) 2016   right breast   COPD, mild (HCC) FOLLOWED BY DR ZOXW   Depression    GERD (gastroesophageal reflux disease)    HTN (hypertension)    Hypertension    Iron deficiency anemia    Long-term current use of steroids SYMBICORT INHALER   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    Overweight (BMI 25.0-29.9) 05/11/2016   Palpitations    Personal history of radiation therapy 2016   Sarcoidosis STABLE PER CXR JUNE 2013   Shortness of breath    Sickle cell trait (HCC)       Dispostion: Admit     Final Clinical Impression(s) / ED Diagnoses Final diagnoses:  Acute cough  COPD exacerbation (HCC)     @PCDICTATION @    Anders Simmonds T, DO 12/06/23 1327

## 2023-12-06 NOTE — H&P (Signed)
History and Physical    Patient: [name] DOB: 04/08/1961 DOA: 12/06/2023 DOS: the patient was seen and examined on 12/06/2023 PCP: [name]., FNP  Patient coming from: Home  Chief Complaint:  Chief Complaint  Patient presents with   Shortness of Breath   Cough   Chest Pain   HPI: [name] is a 63 y.o. 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 . The patient also reported a recent change in her respiratory status, describing it as "funny," and a worsening cough. Occasionally, the cough has been productive and blood has been present.  Other associated symptoms include chills.  The patient had previously undergone chemotherapy for her lung cancer, but it  had to discontinue due to severe side effects that led to hospitalization. She had been scheduled for another round of chemotherapy but was unable to attend due to her current illness.  In addition to her lung cancer, the patient was recently placed under hospice care at home 6 days ago. However, she reported feeling unable to care for herself at home at present, leading to her request for hospital admission. The patient also has a Do Not Resuscitate (DNR) order in place.  She does want to treatment for any signs of infection.  Review of records note patient has been hospitalized twice in November and again in December 2024 just recently discharged 1/2 after coming in with generalized bodyaches and found to have electrolyte abnormalities.  Emergency department patient was noted to be afebrile with pulse 87-1 03, and O2 saturations currently maintained on 4 L of nasal cannula oxygen.  Labs significant for WBC 24.8 and hemoglobin 9.7.   Chest x-ray showing persistent right upper lobe airspace opacity with  associated cavitary lesion and bilateral trace pleural effusions.  Influenza, COVID-19, and RSV screening were negative.CT scan of the head significant for possible acute to subacute infarct in the left basal ganglia that is new when compared from 10/20/2023.  MRI confirms new intracranial metastases with foci of signal abnormality within the left basal ganglia/insular region and dorsal right pons that are new from prior MRI from 09/17/2023.  Patient having given 60 mg of prednisone p.o. and DuoNeb breathing treatment.  Review of Systems: As mentioned in the history of present illness. All other systems reviewed and are negative. Past Medical History:  Diagnosis Date   Acute on chronic respiratory failure (HCC) 10/09/2016   Allergic rhinitis    Breast cancer (HCC) 2016   right breast   COPD, mild (HCC) FOLLOWED BY DR YQIH   Depression    GERD (gastroesophageal reflux disease)    HTN (hypertension)    Hypertension    Iron deficiency anemia    Long-term current use of steroids SYMBICORT INHALER   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    Overweight (BMI 25.0-29.9) 05/11/2016   Palpitations    Personal history of radiation therapy 2016   Sarcoidosis STABLE PER CXR JUNE 2013   Shortness of breath    Sickle cell trait (HCC)    Past Surgical History:  Procedure Laterality Date   ADJUSTABLE SUTURE MANIPULATION  05/22/2012   Procedure: ADJUSTABLE SUTURE MANIPULATION;  Surgeon: Corinda Gubler, MD;  Location: [facility];  Service: Ophthalmology;  Laterality: Right;   BREAST LUMPECTOMY Right 2016   BRONCHIAL BIOPSY  07/09/2023   Procedure: BRONCHIAL BIOPSIES;  Surgeon: Leslye Peer, MD;  Location: [facility] ENDOSCOPY;  Service: Pulmonary;;   BRONCHIAL BRUSHINGS  07/09/2023   Procedure: BRONCHIAL BRUSHINGS;  Surgeon: Leslye Peer, MD;  Location: [facility] ENDOSCOPY;  Service: Pulmonary;;   BRONCHIAL NEEDLE ASPIRATION BIOPSY  07/09/2023   Procedure: BRONCHIAL NEEDLE ASPIRATION BIOPSIES;  Surgeon: Leslye Peer, MD;  Location: [facility] ENDOSCOPY;  Service: Pulmonary;;   CESAREAN SECTION  1986   W/ BILATERAL TUBAL LIGATION   COLONOSCOPY WITH PROPOFOL N/A 02/21/2019   Procedure: COLONOSCOPY WITH PROPOFOL;  Surgeon: Hilarie Fredrickson, MD;  Location: [facility] ENDOSCOPY;  Service: Endoscopy;  Laterality: N/A;   EYE SURGERY     IR IMAGING GUIDED PORT INSERTION  08/01/2023   MEDIAN RECTUS REPAIR  05/22/2012   Procedure: MEDIAN RECTUS REPAIR;  Surgeon: Corinda Gubler, MD;  Location: [facility];  Service: Ophthalmology;  Laterality: Bilateral;  INFERIOR RECTUS RESECTION WITH ADJUSTIBLE SUTURES RIGHT EYE    POLYPECTOMY  02/21/2019   Procedure: POLYPECTOMY;  Surgeon: Hilarie Fredrickson, MD;  Location: [facility] ENDOSCOPY;  Service: Endoscopy;;   RADIOACTIVE SEED GUIDED PARTIAL MASTECTOMY WITH AXILLARY SENTINEL LYMPH NODE BIOPSY Right 08/18/2015   Procedure: RADIOACTIVE SEED GUIDED PARTIAL MASTECTOMY WITH AXILLARY SENTINEL LYMPH NODE BIOPSY;  Surgeon: Almond Lint, MD;  Location: [facility];  Service: General;  Laterality: Right;   TOTAL ROBOTIC ASSISTED LAPAROSCOPIC HYSTERECTOMY  12-30-2010   SYMPTOMATIC UTERINE FIBROIDS   UPPER TEETH EXTRACTION'S  1992   VIDEO BRONCHOSCOPY WITH RADIAL ENDOBRONCHIAL ULTRASOUND  07/09/2023   Procedure: VIDEO BRONCHOSCOPY WITH RADIAL ENDOBRONCHIAL ULTRASOUND;  Surgeon: Leslye Peer, MD;  Location: [facility] ENDOSCOPY;  Service: Pulmonary;;   Social History:  reports that she quit smoking about 20 years ago. Her smoking use included cigarettes. She started smoking about 40 years ago. She has a 20 pack-year smoking history. She has never used smokeless tobacco. She reports that she does not drink alcohol and does not use drugs.  Allergies  Allergen Reactions   Codeine Other (See Comments)    Avoids-felt bad when taken before Pt claims she can have this but does not like to take    Family History  Problem Relation Age of Onset   Hyperlipidemia Mother    Asthma Mother     Lupus Mother    Breast cancer Mother 87   Hypertension Father    Hyperlipidemia Father    Hypertension Sister    Hypertension Brother    Cancer Neg Hx    Diabetes Neg Hx    Coronary artery disease Neg Hx     Prior to Admission medications   Medication Sig Start Date End Date Taking? Authorizing Provider  albuterol (VENTOLIN HFA) 108 (90 Base) MCG/ACT inhaler INHALE 2 PUFFS EVERY 6 HOURS AS NEEDED FOR WHEEZING OR SHORTNESS OF BREATH 09/23/19  Yes Nyoka Cowden, MD  aspirin 81 MG chewable tablet Chew 1 tablet (81 mg total) by mouth daily. 09/22/23  Yes Swayze, Ava, DO  atorvastatin (LIPITOR) 20 MG tablet TAKE 1 TABLET (20 MG TOTAL) BY MOUTH DAILY. 03/28/21  Yes Sandre Kitty, MD  HYDROcodone-acetaminophen (NORCO) 5-325 MG tablet Take 1 tablet by mouth every 6 (six) hours as needed for moderate pain (pain score 4-6). 11/19/23  Yes Pickenpack-Cousar, Arty Baumgartner, NP  levETIRAcetam (KEPPRA) 750 MG tablet Take 1 tablet (750 mg total) by mouth 2 (two) times daily. Patient taking differently: Take 750 mg by mouth daily. 09/21/23  Yes Swayze, Ava, DO  MAGNESIUM-OXIDE 400 (240 Mg) MG tablet Take 1 tablet by mouth every morning. 10/19/23  Yes [provider]  meloxicam (MOBIC) 7.5 MG tablet Take 1 tablet (7.5 mg total) by mouth daily. 11/19/23  Yes Pickenpack-Cousar, Arty Baumgartner, NP  metoprolol tartrate (LOPRESSOR) 25 MG tablet Take 1 tablet (25 mg total) by mouth daily. 07/09/23  Yes Leslye Peer, MD  montelukast (SINGULAIR) 10 MG tablet Take 10 mg by mouth at bedtime. 07/31/22  Yes [provider]  Multiple Vitamin (MULTIVITAMIN) capsule Take 1 capsule by mouth daily with breakfast.   Yes [provider]  omeprazole (PRILOSEC) 20 MG capsule TAKE 1 CAPSULE EVERY DAY Patient taking differently: Take 20 mg by mouth daily before breakfast. 01/03/21  Yes Sandre Kitty, MD  OXYGEN Inhale 2-3 L/min into the lungs See admin instructions. Inhale 2-3 L/min into the lungs CONTINUOUSLY; 2  L/min at rest and 3 L/min if exerted   Yes [provider]  PREDNISONE PO Take 1 tablet by mouth daily with breakfast.   Yes [provider]  sertraline (ZOLOFT) 50 MG tablet TAKE 1 TABLET EVERY DAY 09/11/20  Yes Sandre Kitty, MD  STIOLTO RESPIMAT 2.5-2.5 MCG/ACT AERS USE 2 INHALATIONS BY MOUTH DAILY 09/17/23  Yes Nyoka Cowden, MD  baclofen (LIORESAL) 10 MG tablet Take 1 tablet (10 mg total) by mouth 3 (three) times daily. Patient not taking: Reported on 12/06/2023 11/19/23   Pickenpack-Cousar, Arty Baumgartner, NP  dexamethasone (DECADRON) 4 MG tablet Take 1 tablet (4 mg total) by mouth 2 (two) times daily. Patient not taking: Reported on 12/06/2023 10/19/23   Dorcas Carrow, MD  potassium chloride SA (KLOR-CON M) 20 MEQ tablet Take 1 tablet (20 mEq total) by mouth daily for 14 days. 11/09/23 11/23/23  Glade Lloyd, MD  triamterene-hydrochlorothiazide (MAXZIDE-25) 37.5-25 MG tablet Take 1 tablet by mouth daily. Patient not taking: Reported on 12/06/2023    [provider]    Physical Exam: Vitals:   12/06/23 0915 12/06/23 0930 12/06/23 1000 12/06/23 1058  BP: 136/80 93/74 135/80   Pulse: 87 90 91   Resp: 15 13 17    Temp:    98.7 F (37.1 C)  TempSrc:    Oral  SpO2: 100% 100% 100%     Constitutional: Chronically ill-appearing older female Eyes: PERRL, lids and conjunctivae normal ENMT: Mucous membranes are moist. Posterior pharynx clear of any exudate or lesions.Normal dentition.  Neck: normal, supple  Respiratory: Coarse breath sounds with rhonchi appreciated in the upper right lung field.  O2 saturations currently maintained on 2 L of nasal cannula oxygen. Cardiovascular: Regular rate and rhythm, no murmurs / rubs / gallops. No extremity edema.  Abdomen: no tenderness, no masses palpated.  Bowel sounds positive.  Musculoskeletal: no clubbing / cyanosis. No joint deformity upper and lower extremities. Good ROM, no contractures. Normal muscle tone.  Skin: no  rashes, lesions, ulcers. No induration Neurologic: CN 2-12 grossly intact. Strength 5/5 in all 4.  Psychiatric: Normal judgment and insight. Alert and oriented x 3. Normal mood.   Data Reviewed:  EKG revealed normal sinus rhythm at 90 bpm.  Reviewed labs, imaging, and pertinent records as documented.  Assessment and Plan:   SIRS/sepsis Suspected Healthcare associated pneumonia Patient reports having increased cough and shortness of breath.  White blood cell count elevated at 24.8 which is acutely higher than prior elevation was noted to be tachycardic meeting SIRS criteria. Chest x-ray showing persistent right upper lung zone airspace opacity with associated cavitary lesion.  Previous MRSA screen was positive. -Admit to a telemetry bed -Incentive spirometry and flutter valve -Check blood culture -Check procalcitionin(0.88) and lactic acid -Vancomycin and cefepime normal -Recheck CBC tomorrow morning  Chronic respiratory failure with hypoxia COPD Patient reported increased shortness of breath.  Currently O2 saturations maintained on home 2 L of nasal cannula oxygen. -Continue nasal cannula oxygen to maintain O2 saturation greater than 90% -DuoNebs nebs 4 times daily -Brovana and budesonide nebs twice daily -Prednisone  Brain metastasis from stage IV non-small cell lung cancer  Patient found to have new brain metastases on MRI of the brain today.  Patient currently on hospice since 1/17 -Continue hydrocodone as needed for pain -Continue Keppra -Transitions of care consulted for possible inpatient hospice - Dr. Arbutus Ped added to treatment team  Anemia of chronic disease Hemoglobin 9.7 which appears similar to prior. -Continue to monitor  Anxiety and depression -Continue Zoloft  Hyperlipidemia -Continue atorvastatin  Generalized weakness Failure to thrive Patient reports being weak for which she has been having difficulty caring for self at home.  Son lives there as well  as not reliable per his sister.  DVT prophylaxis: Lovenox Advance Care Planning:   Code Status: Limited: Do not attempt resuscitation (DNR) -DNR-LIMITED -Do Not Intubate/DNI    Consults:palliative care  Family Communication: Daughter updated at bedside  Severity of Illness: The appropriate patient status for this patient is OBSERVATION. Observation status is judged to be reasonable and necessary in order to provide the required intensity of service to ensure the patient's safety. The patient's presenting symptoms, physical exam findings, and initial radiographic and laboratory data in the context of their medical condition is felt to place them at decreased risk for further clinical deterioration. Furthermore, it is anticipated that the patient will be medically stable for discharge from the hospital within 2 midnights of admission.   Author: Clydie Braun, MD 12/06/2023 1:51 PM  For on call review [email or web address].

## 2023-12-06 NOTE — ED Triage Notes (Addendum)
EMS stated, SOB for the last 2 days , hospitalized 2 weeks ago. Hx of COPD . On home 2 L, I put her on 4 L. Has a cough  18g in left forearm.  She does have some chest tightness when she coughs. Pt stated, I started having symptoms again after I finished the Prednisone., Unable to remember how long she has been off the Prednisone

## 2023-12-07 DIAGNOSIS — E872 Acidosis, unspecified: Secondary | ICD-10-CM | POA: Diagnosis present

## 2023-12-07 DIAGNOSIS — D630 Anemia in neoplastic disease: Secondary | ICD-10-CM | POA: Diagnosis present

## 2023-12-07 DIAGNOSIS — J9612 Chronic respiratory failure with hypercapnia: Secondary | ICD-10-CM | POA: Diagnosis present

## 2023-12-07 DIAGNOSIS — C3490 Malignant neoplasm of unspecified part of unspecified bronchus or lung: Secondary | ICD-10-CM | POA: Diagnosis not present

## 2023-12-07 DIAGNOSIS — F32A Depression, unspecified: Secondary | ICD-10-CM | POA: Diagnosis present

## 2023-12-07 DIAGNOSIS — A419 Sepsis, unspecified organism: Secondary | ICD-10-CM | POA: Diagnosis present

## 2023-12-07 DIAGNOSIS — R627 Adult failure to thrive: Secondary | ICD-10-CM | POA: Diagnosis present

## 2023-12-07 DIAGNOSIS — E785 Hyperlipidemia, unspecified: Secondary | ICD-10-CM | POA: Diagnosis present

## 2023-12-07 DIAGNOSIS — J441 Chronic obstructive pulmonary disease with (acute) exacerbation: Secondary | ICD-10-CM | POA: Diagnosis not present

## 2023-12-07 DIAGNOSIS — R051 Acute cough: Secondary | ICD-10-CM | POA: Diagnosis not present

## 2023-12-07 DIAGNOSIS — J189 Pneumonia, unspecified organism: Secondary | ICD-10-CM

## 2023-12-07 DIAGNOSIS — Z515 Encounter for palliative care: Secondary | ICD-10-CM | POA: Diagnosis not present

## 2023-12-07 DIAGNOSIS — E876 Hypokalemia: Secondary | ICD-10-CM | POA: Diagnosis not present

## 2023-12-07 DIAGNOSIS — I119 Hypertensive heart disease without heart failure: Secondary | ICD-10-CM | POA: Diagnosis present

## 2023-12-07 DIAGNOSIS — Z8249 Family history of ischemic heart disease and other diseases of the circulatory system: Secondary | ICD-10-CM | POA: Diagnosis not present

## 2023-12-07 DIAGNOSIS — F418 Other specified anxiety disorders: Secondary | ICD-10-CM | POA: Diagnosis not present

## 2023-12-07 DIAGNOSIS — Y95 Nosocomial condition: Secondary | ICD-10-CM | POA: Diagnosis present

## 2023-12-07 DIAGNOSIS — J9621 Acute and chronic respiratory failure with hypoxia: Secondary | ICD-10-CM | POA: Diagnosis not present

## 2023-12-07 DIAGNOSIS — K219 Gastro-esophageal reflux disease without esophagitis: Secondary | ICD-10-CM | POA: Diagnosis present

## 2023-12-07 DIAGNOSIS — J449 Chronic obstructive pulmonary disease, unspecified: Secondary | ICD-10-CM | POA: Diagnosis not present

## 2023-12-07 DIAGNOSIS — C7931 Secondary malignant neoplasm of brain: Secondary | ICD-10-CM | POA: Diagnosis present

## 2023-12-07 DIAGNOSIS — Z7189 Other specified counseling: Secondary | ICD-10-CM | POA: Diagnosis not present

## 2023-12-07 DIAGNOSIS — J440 Chronic obstructive pulmonary disease with acute lower respiratory infection: Secondary | ICD-10-CM | POA: Diagnosis present

## 2023-12-07 DIAGNOSIS — Z66 Do not resuscitate: Secondary | ICD-10-CM | POA: Diagnosis present

## 2023-12-07 DIAGNOSIS — Z1152 Encounter for screening for COVID-19: Secondary | ICD-10-CM | POA: Diagnosis not present

## 2023-12-07 DIAGNOSIS — C787 Secondary malignant neoplasm of liver and intrahepatic bile duct: Secondary | ICD-10-CM | POA: Diagnosis present

## 2023-12-07 DIAGNOSIS — D573 Sickle-cell trait: Secondary | ICD-10-CM | POA: Diagnosis present

## 2023-12-07 DIAGNOSIS — R6520 Severe sepsis without septic shock: Secondary | ICD-10-CM | POA: Diagnosis present

## 2023-12-07 DIAGNOSIS — T380X5A Adverse effect of glucocorticoids and synthetic analogues, initial encounter: Secondary | ICD-10-CM | POA: Diagnosis present

## 2023-12-07 LAB — COMPREHENSIVE METABOLIC PANEL
ALT: 8 U/L (ref 0–44)
AST: 12 U/L — ABNORMAL LOW (ref 15–41)
Albumin: 2.1 g/dL — ABNORMAL LOW (ref 3.5–5.0)
Alkaline Phosphatase: 111 U/L (ref 38–126)
Anion gap: 12 (ref 5–15)
BUN: 15 mg/dL (ref 8–23)
CO2: 29 mmol/L (ref 22–32)
Calcium: 7.7 mg/dL — ABNORMAL LOW (ref 8.9–10.3)
Chloride: 99 mmol/L (ref 98–111)
Creatinine, Ser: 0.86 mg/dL (ref 0.44–1.00)
GFR, Estimated: >60 mL/min (ref >60)
Glucose, Bld: 123 mg/dL — ABNORMAL HIGH (ref 70–99)
Potassium: 3.5 mmol/L (ref 3.5–5.1)
Sodium: 140 mmol/L (ref 135–145)
Total Bilirubin: 0.8 mg/dL (ref 0.0–1.2)
Total Protein: 5.2 g/dL — ABNORMAL LOW (ref 6.5–8.1)

## 2023-12-07 LAB — CBC
HCT: 28.4 % — ABNORMAL LOW (ref 36.0–46.0)
Hemoglobin: 9.1 g/dL — ABNORMAL LOW (ref 12.0–15.0)
MCH: 28 pg (ref 26.0–34.0)
MCHC: 32 g/dL (ref 30.0–36.0)
MCV: 87.4 fL (ref 80.0–100.0)
Platelets: 281 10*3/uL (ref 150–400)
RBC: 3.25 MIL/uL — ABNORMAL LOW (ref 3.87–5.11)
RDW: 14.6 % (ref 11.5–15.5)
WBC: 29.5 10*3/uL — ABNORMAL HIGH (ref 4.0–10.5)
nRBC: 0 % (ref 0.0–0.2)

## 2023-12-07 LAB — MAGNESIUM: Magnesium: 1.4 mg/dL — ABNORMAL LOW (ref 1.7–2.4)

## 2023-12-07 LAB — MRSA NEXT GEN BY PCR, NASAL: MRSA by PCR Next Gen: DETECTED — AB

## 2023-12-07 LAB — LACTIC ACID, PLASMA
Lactic Acid, Venous: 1.2 mmol/L (ref 0.5–1.9)
Lactic Acid, Venous: 2.4 mmol/L (ref 0.5–1.9)

## 2023-12-07 MED ORDER — MAGNESIUM SULFATE 2 GM/50ML IV SOLN
2.0000 g | Freq: Once | INTRAVENOUS | Status: AC
  Administered 2023-12-07: 2 g via INTRAVENOUS
  Filled 2023-12-07: qty 50

## 2023-12-07 MED ORDER — SODIUM CHLORIDE 0.9 % IV BOLUS
500.0000 mL | Freq: Once | INTRAVENOUS | Status: AC
  Administered 2023-12-07: 500 mL via INTRAVENOUS

## 2023-12-07 MED ORDER — IPRATROPIUM-ALBUTEROL 0.5-2.5 (3) MG/3ML IN SOLN
3.0000 mL | Freq: Two times a day (BID) | RESPIRATORY_TRACT | Status: DC
Start: 2023-12-07 — End: 2023-12-08
  Administered 2023-12-07 – 2023-12-08 (×2): 3 mL via RESPIRATORY_TRACT
  Filled 2023-12-07 (×2): qty 3

## 2023-12-07 NOTE — Plan of Care (Signed)
Problem: Clinical Measurements: Goal: Respiratory complications will improve Outcome: Progressing   Problem: Activity: Goal: Risk for activity intolerance will decrease Outcome: Progressing   Problem: Coping: Goal: Level of anxiety will decrease Outcome: Progressing

## 2023-12-07 NOTE — Plan of Care (Signed)
Problem: Education: Goal: Knowledge of General Education information will improve Description Including pain rating scale, medication(s)/side effects and non-pharmacologic comfort measures Outcome: Progressing

## 2023-12-07 NOTE — Progress Notes (Signed)
PROGRESS NOTE    [name]  ZOX:[id number] DOB: [birth year] DOA: 12/06/2023 PCP: [name]., FNP     Brief Narrative:  [name] is a 63 y.o. 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 The patient also reported a recent change in her respiratory status, describing it as "funny," and a worsening cough. Occasionally, the cough has been productive and blood has been present.  Other associated symptoms include chills.   The patient had previously undergone chemotherapy for her lung cancer, but it  had to discontinue due to severe side effects that led to hospitalization. She had been scheduled for another round of chemotherapy but was unable to attend due to her current illness.   In addition to her lung cancer, the patient was recently placed under hospice care at home 6 days ago. However, she reported feeling unable to care for herself at home at present, leading to her request for hospital admission. The patient also has a Do Not Resuscitate (DNR) order in place.  She does want to treatment for any signs of infection.  New events last 24 hours / Subjective: Patient admits to not feeling well.  She denies that she was enrolled in hospice at home (?)   Assessment & Plan:   Principal Problem:   HCAP (healthcare-associated pneumonia) Active Problems:   Sepsis (HCC)   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    Chronic respiratory failure with hypoxia and hypercapnia (HCC)   Lung cancer metastatic to brain (HCC)   Anemia, chronic disease   Depression with anxiety   Hyperlipidemia   Generalized weakness   Severe sepsis secondary to HCAP -Sepsis present on admission with leukocytosis, tachycardia, lactic acidosis 4.9 -Chest x-ray showed  persistent right upper lung air space opacity associated with cavitary lesion -Vancomycin, cefepime  COPD with chronic hypoxic respiratory failure -Uses 2 L nasal cannula O2 at baseline -Continue prednisone, breathing treatments  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, with mets to brain  -Patient was apparently enrolled in home hospice 1/17 -Keppra -Palliative care consult  Hyperlipidemia -Lipitor  Depression and anxiety -Zoloft  Hypomagnesia -Replace   DVT prophylaxis:  enoxaparin (LOVENOX) injection 40 mg Start: 12/06/23 2000  Code Status: DNR Family Communication: No family at bedside Disposition Plan: TBD Status is: Inpatient Remains inpatient appropriate because: IV antibiotics    Antimicrobials:  Anti-infectives (From admission, onward)    Start     Dose/Rate Route Frequency Ordered Stop   12/07/23 1800  vancomycin (VANCOREADY) IVPB 1250 mg/250 mL       Placed in "Followed by" Linked Group   1,250 mg 166.7 mL/hr over 90 Minutes Intravenous Every 24 hours 12/06/23 1938     12/06/23 2030  vancomycin (VANCOREADY) IVPB 1500 mg/300 mL       Placed in "Followed by" Linked Group   1,500 mg 150 mL/hr over 120 Minutes Intravenous  Once 12/06/23 1938 12/07/23 0143   12/06/23 2015  ceFEPIme (MAXIPIME) 1 g in sodium chloride 0.9 % 100 mL IVPB        1 g 200 mL/hr over 30 Minutes Intravenous Every 8 hours 12/06/23 1919          Objective: Vitals:   12/06/23 2200 12/07/23 0611 12/07/23 0738 12/07/23 0743  BP: 125/66 139/66  136/67  Pulse: 68 72  80  Resp: 10 16  16   Temp: 98.1 F (36.7 C) 97.9 F (36.6 C)  98.2 F (36.8 C)  TempSrc: Oral Oral  Oral  SpO2:  100% 98% 100%  Weight:  64.7 kg    Height:  5\' 5"  (1.651 m)      Intake/Output Summary (Last 24 hours) at 12/07/2023 1402 Last data filed at 12/07/2023 0900 Gross per 24 hour  Intake 745.54 ml  Output --  Net 745.54 ml   Filed Weights   12/07/23 0611  Weight: 64.7 kg    Examination:   General exam: Appears calm and comfortable  Respiratory system:  Respiratory effort normal. No respiratory distress. No conversational dyspnea.  Cardiovascular system: S1 & S2 heard, RRR. No murmurs. No pedal edema. Gastrointestinal system: Abdomen is nondistended, soft and nontender. Normal bowel sounds heard. Central nervous system: Alert  Extremities: Symmetric in appearance  Skin: No rashes, lesions or ulcers on exposed skin   Data Reviewed: I have personally reviewed following labs and imaging studies  CBC: Recent Labs  Lab 12/06/23 0748 12/07/23 0637  WBC 24.8* 29.5*  HGB 9.7* 9.1*  HCT 31.2* 28.4*  MCV 90.2 87.4  PLT 256 281   Basic Metabolic Panel: Recent Labs  Lab 12/06/23 0748 12/07/23 0637  NA 142 140  K 3.5 3.5  CL 97* 99  CO2 34* 29  GLUCOSE 91 123*  BUN 9 15  CREATININE 0.83 0.86  CALCIUM 8.6* 7.7*  MG  --  1.4*   GFR: Estimated Creatinine Clearance: 60.2 mL/min (by C-G formula based on SCr of 0.86 mg/dL). Liver Function Tests: Recent Labs  Lab 12/07/23 0637  AST 12*  ALT 8  ALKPHOS 111  BILITOT 0.8  PROT 5.2*  ALBUMIN 2.1*   No results for input(s): "LIPASE", "AMYLASE" in the last 168 hours. No results for input(s): "AMMONIA" in the last 168 hours. Coagulation Profile: No results for input(s): "INR", "PROTIME" in the last 168 hours. Cardiac Enzymes: No results for input(s): "CKTOTAL", "CKMB", "CKMBINDEX", "TROPONINI" in the last 168 hours. BNP (last 3 results) No results for input(s): "PROBNP" in the last 8760 hours. HbA1C: No results for input(s): "HGBA1C" in the last 72 hours. CBG: No results for input(s): "GLUCAP" in the last 168 hours. Lipid Profile: No results for input(s): "CHOL", "HDL", "LDLCALC", "TRIG", "CHOLHDL", "LDLDIRECT" in the last 72 hours. Thyroid Function Tests: No results for input(s): "TSH", "T4TOTAL", "FREET4", "T3FREE", "THYROIDAB" in the last 72 hours. Anemia Panel: No results for input(s): "VITAMINB12",  "FOLATE", "FERRITIN", "TIBC", "IRON", "RETICCTPCT" in the last 72 hours. Sepsis Labs: Recent Labs  Lab 12/06/23 0929 12/06/23 2108 12/07/23 0004 12/07/23 0637  PROCALCITON 0.88  --   --   --   LATICACIDVEN  --  4.9* 2.4* 1.2    Recent Results (from the past 240 hours)  Resp panel by RT-PCR (RSV, Flu A&B, Covid) Anterior Nasal Swab     Status: None   Collection Time: 12/06/23  9:15 AM   Specimen: Anterior Nasal Swab  Result Value Ref Range Status   SARS Coronavirus 2 by RT PCR NEGATIVE NEGATIVE Final   Influenza A by PCR NEGATIVE NEGATIVE Final   Influenza B by PCR NEGATIVE NEGATIVE Final    Comment: (NOTE) The Xpert Xpress SARS-CoV-2/FLU/RSV plus assay is intended as an aid in the diagnosis of influenza from Nasopharyngeal swab specimens and should not be used as a sole basis for treatment. Nasal washings and aspirates are unacceptable for  Xpert Xpress SARS-CoV-2/FLU/RSV testing.  Fact Sheet for Patients: BloggerCourse.com  Fact Sheet for Healthcare Providers: SeriousBroker.it  This test is not yet approved or cleared by the [place] FDA and has been authorized for detection and/or diagnosis of SARS-CoV-2 by FDA under an Emergency Use Authorization (EUA). This EUA will remain in effect (meaning this test can be used) for the duration of the COVID-19 declaration under Section 564(b)(1) of the Act, 21 U.S.C. section 360bbb-3(b)(1), unless the authorization is terminated or revoked.     Resp Syncytial Virus by PCR NEGATIVE NEGATIVE Final    Comment: (NOTE) Fact Sheet for Patients: BloggerCourse.com  Fact Sheet for Healthcare Providers: SeriousBroker.it  This test is not yet approved or cleared by the [place] FDA and has been authorized for detection and/or diagnosis of SARS-CoV-2 by FDA under an Emergency Use Authorization (EUA). This EUA will remain in effect  (meaning this test can be used) for the duration of the COVID-19 declaration under Section 564(b)(1) of the Act, 21 U.S.C. section 360bbb-3(b)(1), unless the authorization is terminated or revoked.  Performed at [facility], [address], [place], [place] [address]   MRSA Next Gen by PCR, Nasal     Status: Abnormal   Collection Time: 12/06/23  7:32 PM   Specimen: Nasal Mucosa; Nasal Swab  Result Value Ref Range Status   MRSA by PCR Next Gen DETECTED (A) NOT DETECTED Final    Comment: RESULT CALLED TO, READ BACK BY AND VERIFIED WITH: M SHRESTHA 12/07/2023 @ 0517 BY AB (NOTE) The GeneXpert MRSA Assay (FDA approved for NASAL specimens only), is one component of a comprehensive MRSA colonization surveillance program. It is not intended to diagnose MRSA infection nor to guide or monitor treatment for MRSA infections. Test performance is not FDA approved in patients less than 96 years old. Performed at [facility], [address] [place], [place] [address]   Culture, blood (single) w Reflex to ID Panel     Status: None (Preliminary result)   Collection Time: 12/06/23  9:09 PM   Specimen: BLOOD  Result Value Ref Range Status   Specimen Description BLOOD SITE NOT SPECIFIED  Final   Special Requests   Final    BOTTLES DRAWN AEROBIC AND ANAEROBIC Blood Culture results may not be optimal due to an inadequate volume of blood received in culture bottles   Culture   Final    NO GROWTH < 12 HOURS Performed at [facility], [address], [place], [place] [address]    Report Status PENDING  Incomplete      Radiology Studies: MR BRAIN WO CONTRAST Result Date: 12/06/2023 CLINICAL DATA:  Provided history: Stroke, follow-up. EXAM: MRI HEAD WITHOUT CONTRAST TECHNIQUE: Multiplanar, multiecho pulse sequences of the brain and surrounding structures were obtained without intravenous contrast. COMPARISON:  Prior head CT examinations 12/06/2023 and earlier. Brain MRI 09/17/2023.  FINDINGS: Please note, there is limited reassessment of the patient's known intracranial metastatic disease, and limited assessment for new intracranial metastases, on this non-contrast brain MRI. Brain: No age-advanced or lobar predominant parenchymal atrophy. Area of parenchymal edema centered in the left basal ganglia and insular region, measuring 4.8 x 3.3 cm in transaxial dimensions. This is new from the prior brain MRI of 09/17/2023 and highly suspicious for the presence of a new metastasis at this site. Specifically, there is a suspected new 11 mm partially cystic metastasis centrally within this region of edema (series 5, image 13). 1.4 cm focus of  parenchymal edema within the dorsal right pons, new from the prior brain MRI and highly suspicious for the presence of a new metastasis at this site (series 5, image 8) (series 6, image 8). Small region of cortical/subcortical T2 FLAIR hyperintense signal abnormality within the lateral left temporal lobe at site of a known metastasis. Region of cortical/subcortical edema within the left occipital lobe spanning 4 cm, in the region of a known left occipital metastasis. Chronic blood products also present at this site (series 7, image 11). No edema persists at site of a previously demonstrated metastasis within the right parietal lobe (series 6, image 21). Minimal residual T2 FLAIR hyperintense signal abnormality within the lateral left cerebellar hemisphere, at site of a previously demonstrated mass (series 6, image 6). Mild multifocal T2 FLAIR hyperintense signal abnormality elsewhere within the cerebral white matter and pons, nonspecific but compatible with chronic small vessel ischemic disease. Partially empty sella turcica. There is no acute infarct. No extra-axial fluid collection. No midline shift. Vascular: Maintained flow voids within the proximal large arterial vessels. Skull and upper cervical spine: No focal worrisome marrow lesion. Sinuses/Orbits: No  mass or acute finding within the imaged orbits. Prior bilateral ocular lens replacement. Small mucous retention cyst within the left maxillary sinus. Other: Trace fluid within the bilateral mastoid air cells. IMPRESSION: 1. Please note, there is limited reassessment of the patient's known intracranial metastatic disease, and limited assessment for new intracranial metastases, on this non-contrast brain MRI. 2. Foci of signal abnormality within the left basal ganglia/insular region and dorsal right pons, new from the prior brain MRI of 09/17/2023 and suspected to reflect sites of new metastases. Post-contrast MR imaging of the brain recommended for further evaluation. 3. Parenchymal edema persists at sites of some of the patient's previously demonstrated metastases, as described. Post-contrast MR imaging of the brain recommended for further evaluation. 4. Background mild chronic small vessel ischemic disease. Electronically Signed   By: Jackey Loge D.O.   On: 12/06/2023 13:00   CT Head Wo Contrast Result Date: 12/06/2023 CLINICAL DATA:  Delirium EXAM: CT HEAD WITHOUT CONTRAST TECHNIQUE: Contiguous axial images were obtained from the base of the skull through the vertex without intravenous contrast. RADIATION DOSE REDUCTION: This exam was performed according to the departmental dose-optimization program which includes automated exposure control, adjustment of the mA and/or kV according to patient size and/or use of iterative reconstruction technique. COMPARISON:  Head CT 10/20/2023 FINDINGS: Brain: No hemorrhage. No hydrocephalus. No extra-axial fluid collection. No mass effect. No mass lesion. Possible acute to subacute infarct in the left basal ganglia, new/evolved compared to 10/20/2023. Vascular: No hyperdense vessel or unexpected calcification. Skull: Normal. Negative for fracture or focal lesion. Sinuses/Orbits: No middle ear mastoid effusion. Paranasal sinuses are clear. Bilateral lens replacement. Orbits  are otherwise unremarkable. Other: None. IMPRESSION: Possible acute to subacute infarct in the left basal ganglia, new/evolved compared to 10/20/2023. Consider further evaluation with a brain MRI. Electronically Signed   By: Lorenza Cambridge M.D.   On: 12/06/2023 10:30   DG Chest 2 View Result Date: 12/06/2023 CLINICAL DATA:  SOb  SOB, cough, chest pain EXAM: CHEST - 2 VIEW COMPARISON:  CT angio chest 11/03/2023, CT abdomen pelvis 11/24/2023, chest x-ray 11/13/2023 FINDINGS: Right chest wall Port-A-Cath with tip overlying the expected region of distal superior vena cava. The heart and mediastinal contours are within normal limits. Hyperinflation of the lungs with slightly coarsened interstitial markings. Right upper lobe scarring and architectural distortion with persistent right upper lung zone  airspace opacity with associated cavitary lesion. Right middle lobe architectural distortion and scarring again noted. No pulmonary edema. Flattening and blunting of bilateral costophrenic angles with trace pleural effusions. No pneumothorax. No acute osseous abnormality. Right chest wall/axillary surgical clips/vascular clips. IMPRESSION: 1. Persistent right upper lung zone airspace opacity with associated cavitary lesion. 2. Bilateral trace pleural effusions. 3. Aortic Atherosclerosis (ICD10-I70.0) and Emphysema (ICD10-J43.9). Electronically Signed   By: Tish Frederickson M.D.   On: 12/06/2023 08:09      Scheduled Meds:  arformoterol  15 mcg Nebulization BID   aspirin  81 mg Oral Daily   atorvastatin  20 mg Oral Daily   budesonide (PULMICORT) nebulizer solution  0.5 mg Nebulization BID   enoxaparin (LOVENOX) injection  40 mg Subcutaneous Q24H   guaiFENesin  600 mg Oral BID   ipratropium-albuterol  3 mL Nebulization BID   levETIRAcetam  750 mg Oral Daily   metoprolol tartrate  25 mg Oral Daily   montelukast  10 mg Oral QHS   pantoprazole  40 mg Oral Daily   predniSONE  40 mg Oral Q breakfast   sertraline  50  mg Oral Daily   sodium chloride flush  3 mL Intravenous Q12H   Continuous Infusions:  ceFEPime (MAXIPIME) IV 1 g (12/07/23 0631)   magnesium sulfate bolus IVPB     vancomycin       LOS: 0 days   Time spent: 25 minutes   Noralee Stain, DO [facility] 12/07/2023, 2:02 PM   Available via Epic secure chat 7am-7pm After these hours, please refer to coverage provider listed on [email or web address]

## 2023-12-08 ENCOUNTER — Ambulatory Visit: Payer: 59

## 2023-12-08 DIAGNOSIS — J9621 Acute and chronic respiratory failure with hypoxia: Secondary | ICD-10-CM

## 2023-12-08 DIAGNOSIS — Z515 Encounter for palliative care: Secondary | ICD-10-CM | POA: Diagnosis not present

## 2023-12-08 DIAGNOSIS — C3490 Malignant neoplasm of unspecified part of unspecified bronchus or lung: Secondary | ICD-10-CM

## 2023-12-08 DIAGNOSIS — Z7189 Other specified counseling: Secondary | ICD-10-CM

## 2023-12-08 DIAGNOSIS — C7931 Secondary malignant neoplasm of brain: Secondary | ICD-10-CM

## 2023-12-08 DIAGNOSIS — J189 Pneumonia, unspecified organism: Secondary | ICD-10-CM | POA: Diagnosis not present

## 2023-12-08 LAB — BASIC METABOLIC PANEL
Anion gap: 11 (ref 5–15)
BUN: 17 mg/dL (ref 8–23)
CO2: 28 mmol/L (ref 22–32)
Calcium: 7.7 mg/dL — ABNORMAL LOW (ref 8.9–10.3)
Chloride: 99 mmol/L (ref 98–111)
Creatinine, Ser: 0.98 mg/dL (ref 0.44–1.00)
GFR, Estimated: >60 mL/min (ref >60)
Glucose, Bld: 119 mg/dL — ABNORMAL HIGH (ref 70–99)
Potassium: 3.2 mmol/L — ABNORMAL LOW (ref 3.5–5.1)
Sodium: 138 mmol/L (ref 135–145)

## 2023-12-08 LAB — CBC
HCT: 25.7 % — ABNORMAL LOW (ref 36.0–46.0)
Hemoglobin: 8.2 g/dL — ABNORMAL LOW (ref 12.0–15.0)
MCH: 27.9 pg (ref 26.0–34.0)
MCHC: 31.9 g/dL (ref 30.0–36.0)
MCV: 87.4 fL (ref 80.0–100.0)
Platelets: 282 10*3/uL (ref 150–400)
RBC: 2.94 MIL/uL — ABNORMAL LOW (ref 3.87–5.11)
RDW: 14.8 % (ref 11.5–15.5)
WBC: 25.5 10*3/uL — ABNORMAL HIGH (ref 4.0–10.5)
nRBC: 0 % (ref 0.0–0.2)

## 2023-12-08 LAB — MAGNESIUM: Magnesium: 2.1 mg/dL (ref 1.7–2.4)

## 2023-12-08 MED ORDER — CHLORHEXIDINE GLUCONATE CLOTH 2 % EX PADS
6.0000 | MEDICATED_PAD | Freq: Every day | CUTANEOUS | Status: DC
  Administered 2023-12-09 – 2023-12-14 (×6): 6 via TOPICAL

## 2023-12-08 MED ORDER — SODIUM CHLORIDE 0.9% FLUSH
10.0000 mL | INTRAVENOUS | Status: DC | PRN
  Administered 2023-12-10 (×3): 10 mL

## 2023-12-08 MED ORDER — ENSURE ENLIVE PO LIQD
237.0000 mL | Freq: Two times a day (BID) | ORAL | Status: DC
  Administered 2023-12-08 – 2023-12-14 (×10): 237 mL via ORAL
  Filled 2023-12-08: qty 237

## 2023-12-08 MED ORDER — POTASSIUM CHLORIDE CRYS ER 20 MEQ PO TBCR
40.0000 meq | EXTENDED_RELEASE_TABLET | ORAL | Status: AC
  Administered 2023-12-08 (×2): 40 meq via ORAL
  Filled 2023-12-08 (×2): qty 2

## 2023-12-08 MED ORDER — SODIUM CHLORIDE 0.9% FLUSH
10.0000 mL | Freq: Two times a day (BID) | INTRAVENOUS | Status: DC
Start: 2023-12-08 — End: 2023-12-14
  Administered 2023-12-08 – 2023-12-14 (×9): 10 mL

## 2023-12-08 NOTE — Progress Notes (Signed)
Pt refusing Red med (Lovenox) states " I will take it tomorrow, I do not need it today". Education was provided at this time and the on-call hospitalist Anthoney Harada, NP) was notified via secure chat at [phone] with no new orders or further instructions provided at this time.

## 2023-12-08 NOTE — Progress Notes (Signed)
PROGRESS NOTE    [name]  UJW:[id number] DOB: November 19, 1960 DOA: 12/06/2023 PCP: [name]., FNP     Brief Narrative:  [name] is a 63 y.o. 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 The patient also reported a recent change in her respiratory status, describing it as "funny," and a worsening cough. Occasionally, the cough has been productive and blood has been present.  Other associated symptoms include chills.   The patient had previously undergone chemotherapy for her lung cancer, but it  had to discontinue due to severe side effects that led to hospitalization. She had been scheduled for another round of chemotherapy but was unable to attend due to her current illness.   In addition to her lung cancer, the patient was recently placed under hospice care at home 6 days ago. However, she reported feeling unable to care for herself at home at present, leading to her request for hospital admission. The patient also has a Do Not Resuscitate (DNR) order in place.  She does want to treatment for any signs of infection.  New events last 24 hours / Subjective: She states that she is feeling better since admission.  Breathing better.  I asked her about hospice and that she had reportedly been under care of a week ago.  She tells me that she does not remember, could be possible.  She states that her memory is quite poor.  Assessment & Plan:   Principal Problem:   HCAP (healthcare-associated pneumonia) Active Problems:   Sepsis (HCC)   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    Chronic respiratory failure with hypoxia and hypercapnia (HCC)   Lung cancer metastatic to brain (HCC)   Anemia, chronic disease   Depression with anxiety   Hyperlipidemia    Generalized weakness   Severe sepsis secondary to HCAP -Sepsis present on admission with leukocytosis, tachycardia, lactic acidosis 4.9 -Chest x-ray showed persistent right upper lung air space opacity associated with cavitary lesion -Vancomycin, cefepime  COPD with chronic hypoxic respiratory failure -Uses 2 L nasal cannula O2 at baseline -Continue prednisone, breathing treatments  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, with mets to brain  -Patient was apparently enrolled in home hospice 1/17 -Keppra -Palliative care consult  Hyperlipidemia -Lipitor  Depression and anxiety -Zoloft  Hypokalemia -Replace   DVT prophylaxis:  enoxaparin (LOVENOX) injection 40 mg Start: 12/06/23 2000  Code Status: DNR Family Communication: No family at bedside Disposition Plan: TBD Status is: Inpatient Remains inpatient appropriate because: IV antibiotics    Antimicrobials:  Anti-infectives (From admission, onward)    Start     Dose/Rate Route Frequency Ordered Stop   12/07/23 1800  vancomycin (VANCOREADY) IVPB 1250 mg/250 mL       Placed in "Followed by" Linked Group   1,250 mg 166.7 mL/hr over 90 Minutes Intravenous Every 24 hours 12/06/23 1938     12/06/23 2030  vancomycin (VANCOREADY) IVPB 1500 mg/300 mL       Placed in "Followed by" Linked Group   1,500 mg 150 mL/hr over 120 Minutes Intravenous  Once 12/06/23 1938 12/07/23 0143   12/06/23 2015  ceFEPIme (MAXIPIME) 1 g in sodium chloride 0.9 % 100 mL IVPB        1 g 200 mL/hr over 30 Minutes  Intravenous Every 8 hours 12/06/23 1919          Objective: Vitals:   12/08/23 0320 12/08/23 0800 12/08/23 0814 12/08/23 0922  BP: 106/66  122/70 122/70  Pulse: 82  85 85  Resp: 16  17   Temp: 98 F (36.7 C)  97.7 F (36.5 C)   TempSrc:   Oral   SpO2: 100% 97% 100%   Weight:      Height:        Intake/Output Summary (Last 24 hours) at 12/08/2023 1325 Last data filed at 12/08/2023 0928 Gross per 24 hour  Intake 883  ml  Output --  Net 883 ml   Filed Weights   12/07/23 0611  Weight: 64.7 kg    Examination:  General exam: Appears calm and comfortable  Respiratory system:  Respiratory effort normal. No respiratory distress. No conversational dyspnea.  On 2 L oxygen Cardiovascular system: S1 & S2 heard, RRR. No murmurs. No pedal edema. Gastrointestinal system: Abdomen is nondistended, soft and nontender. Normal bowel sounds heard. Central nervous system: Alert  Extremities: Symmetric in appearance  Skin: No rashes, lesions or ulcers on exposed skin   Data Reviewed: I have personally reviewed following labs and imaging studies  CBC: Recent Labs  Lab 12/06/23 0748 12/07/23 0637 12/08/23 0616  WBC 24.8* 29.5* 25.5*  HGB 9.7* 9.1* 8.2*  HCT 31.2* 28.4* 25.7*  MCV 90.2 87.4 87.4  PLT 256 281 282   Basic Metabolic Panel: Recent Labs  Lab 12/06/23 0748 12/07/23 0637 12/08/23 0616  NA 142 140 138  K 3.5 3.5 3.2*  CL 97* 99 99  CO2 34* 29 28  GLUCOSE 91 123* 119*  BUN 9 15 17   CREATININE 0.83 0.86 0.98  CALCIUM 8.6* 7.7* 7.7*  MG  --  1.4* 2.1   GFR: Estimated Creatinine Clearance: 52.9 mL/min (by C-G formula based on SCr of 0.98 mg/dL). Liver Function Tests: Recent Labs  Lab 12/07/23 0637  AST 12*  ALT 8  ALKPHOS 111  BILITOT 0.8  PROT 5.2*  ALBUMIN 2.1*   No results for input(s): "LIPASE", "AMYLASE" in the last 168 hours. No results for input(s): "AMMONIA" in the last 168 hours. Coagulation Profile: No results for input(s): "INR", "PROTIME" in the last 168 hours. Cardiac Enzymes: No results for input(s): "CKTOTAL", "CKMB", "CKMBINDEX", "TROPONINI" in the last 168 hours. BNP (last 3 results) No results for input(s): "PROBNP" in the last 8760 hours. HbA1C: No results for input(s): "HGBA1C" in the last 72 hours. CBG: No results for input(s): "GLUCAP" in the last 168 hours. Lipid Profile: No results for input(s): "CHOL", "HDL", "LDLCALC", "TRIG", "CHOLHDL", "LDLDIRECT"  in the last 72 hours. Thyroid Function Tests: No results for input(s): "TSH", "T4TOTAL", "FREET4", "T3FREE", "THYROIDAB" in the last 72 hours. Anemia Panel: No results for input(s): "VITAMINB12", "FOLATE", "FERRITIN", "TIBC", "IRON", "RETICCTPCT" in the last 72 hours. Sepsis Labs: Recent Labs  Lab 12/06/23 0929 12/06/23 2108 12/07/23 0004 12/07/23 0637  PROCALCITON 0.88  --   --   --   LATICACIDVEN  --  4.9* 2.4* 1.2    Recent Results (from the past 240 hours)  Resp panel by RT-PCR (RSV, Flu A&B, Covid) Anterior Nasal Swab     Status: None   Collection Time: 12/06/23  9:15 AM   Specimen: Anterior Nasal Swab  Result Value Ref Range Status   SARS Coronavirus 2 by RT PCR NEGATIVE NEGATIVE Final   Influenza A by PCR NEGATIVE NEGATIVE Final   Influenza B by  PCR NEGATIVE NEGATIVE Final    Comment: (NOTE) The Xpert Xpress SARS-CoV-2/FLU/RSV plus assay is intended as an aid in the diagnosis of influenza from Nasopharyngeal swab specimens and should not be used as a sole basis for treatment. Nasal washings and aspirates are unacceptable for Xpert Xpress SARS-CoV-2/FLU/RSV testing.  Fact Sheet for Patients: BloggerCourse.com  Fact Sheet for Healthcare Providers: SeriousBroker.it  This test is not yet approved or cleared by the [place] FDA and has been authorized for detection and/or diagnosis of SARS-CoV-2 by FDA under an Emergency Use Authorization (EUA). This EUA will remain in effect (meaning this test can be used) for the duration of the COVID-19 declaration under Section 564(b)(1) of the Act, 21 U.S.C. section 360bbb-3(b)(1), unless the authorization is terminated or revoked.     Resp Syncytial Virus by PCR NEGATIVE NEGATIVE Final    Comment: (NOTE) Fact Sheet for Patients: BloggerCourse.com  Fact Sheet for Healthcare Providers: SeriousBroker.it  This test is not  yet approved or cleared by the [place] FDA and has been authorized for detection and/or diagnosis of SARS-CoV-2 by FDA under an Emergency Use Authorization (EUA). This EUA will remain in effect (meaning this test can be used) for the duration of the COVID-19 declaration under Section 564(b)(1) of the Act, 21 U.S.C. section 360bbb-3(b)(1), unless the authorization is terminated or revoked.  Performed at [facility], [address], [place], [place] [address]   MRSA Next Gen by PCR, Nasal     Status: Abnormal   Collection Time: 12/06/23  7:32 PM   Specimen: Nasal Mucosa; Nasal Swab  Result Value Ref Range Status   MRSA by PCR Next Gen DETECTED (A) NOT DETECTED Final    Comment: RESULT CALLED TO, READ BACK BY AND VERIFIED WITH: M SHRESTHA 12/07/2023 @ 0517 BY AB (NOTE) The GeneXpert MRSA Assay (FDA approved for NASAL specimens only), is one component of a comprehensive MRSA colonization surveillance program. It is not intended to diagnose MRSA infection nor to guide or monitor treatment for MRSA infections. Test performance is not FDA approved in patients less than 42 years old. Performed at [facility], [address] [place], [place] [address]   Culture, blood (single) w Reflex to ID Panel     Status: None (Preliminary result)   Collection Time: 12/06/23  9:09 PM   Specimen: BLOOD  Result Value Ref Range Status   Specimen Description BLOOD SITE NOT SPECIFIED  Final   Special Requests   Final    BOTTLES DRAWN AEROBIC AND ANAEROBIC Blood Culture results may not be optimal due to an inadequate volume of blood received in culture bottles   Culture   Final    NO GROWTH 2 DAYS Performed at [facility], [address] [place], [place] [address]    Report Status PENDING  Incomplete      Radiology Studies: No results found.     Scheduled Meds:  arformoterol  15 mcg Nebulization BID   aspirin  81 mg Oral Daily   atorvastatin  20 mg Oral Daily    budesonide (PULMICORT) nebulizer solution  0.5 mg Nebulization BID   enoxaparin (LOVENOX) injection  40 mg Subcutaneous Q24H   guaiFENesin  600 mg Oral BID   levETIRAcetam  750 mg Oral Daily   metoprolol tartrate  25 mg Oral Daily   montelukast  10 mg Oral QHS   pantoprazole  40 mg Oral Daily   potassium chloride  40 mEq Oral Q4H  predniSONE  40 mg Oral Q breakfast   sertraline  50 mg Oral Daily   sodium chloride flush  3 mL Intravenous Q12H   Continuous Infusions:  ceFEPime (MAXIPIME) IV 1 g (12/08/23 0524)   vancomycin 1,250 mg (12/07/23 1838)     LOS: 1 day   Time spent: 25 minutes   Noralee Stain, DO [facility] 12/08/2023, 1:25 PM   Available via Epic secure chat 7am-7pm After these hours, please refer to coverage provider listed on [email or web address]

## 2023-12-09 DIAGNOSIS — J441 Chronic obstructive pulmonary disease with (acute) exacerbation: Secondary | ICD-10-CM | POA: Diagnosis not present

## 2023-12-09 DIAGNOSIS — Z515 Encounter for palliative care: Secondary | ICD-10-CM | POA: Diagnosis not present

## 2023-12-09 DIAGNOSIS — C3490 Malignant neoplasm of unspecified part of unspecified bronchus or lung: Secondary | ICD-10-CM | POA: Diagnosis not present

## 2023-12-09 DIAGNOSIS — J189 Pneumonia, unspecified organism: Secondary | ICD-10-CM | POA: Diagnosis not present

## 2023-12-09 DIAGNOSIS — J9612 Chronic respiratory failure with hypercapnia: Secondary | ICD-10-CM

## 2023-12-09 DIAGNOSIS — J9611 Chronic respiratory failure with hypoxia: Secondary | ICD-10-CM

## 2023-12-09 LAB — CBC
HCT: 23.8 % — ABNORMAL LOW (ref 36.0–46.0)
Hemoglobin: 7.5 g/dL — ABNORMAL LOW (ref 12.0–15.0)
MCH: 28 pg (ref 26.0–34.0)
MCHC: 31.5 g/dL (ref 30.0–36.0)
MCV: 88.8 fL (ref 80.0–100.0)
Platelets: 251 10*3/uL (ref 150–400)
RBC: 2.68 MIL/uL — ABNORMAL LOW (ref 3.87–5.11)
RDW: 14.9 % (ref 11.5–15.5)
WBC: 17.4 10*3/uL — ABNORMAL HIGH (ref 4.0–10.5)
nRBC: 0 % (ref 0.0–0.2)

## 2023-12-09 LAB — BASIC METABOLIC PANEL
Anion gap: 9 (ref 5–15)
BUN: 18 mg/dL (ref 8–23)
CO2: 30 mmol/L (ref 22–32)
Calcium: 7.9 mg/dL — ABNORMAL LOW (ref 8.9–10.3)
Chloride: 102 mmol/L (ref 98–111)
Creatinine, Ser: 1 mg/dL (ref 0.44–1.00)
GFR, Estimated: >60 mL/min (ref >60)
Glucose, Bld: 110 mg/dL — ABNORMAL HIGH (ref 70–99)
Potassium: 3.6 mmol/L (ref 3.5–5.1)
Sodium: 141 mmol/L (ref 135–145)

## 2023-12-09 LAB — MAGNESIUM: Magnesium: 2 mg/dL (ref 1.7–2.4)

## 2023-12-09 MED ORDER — SODIUM CHLORIDE 0.9 % IV SOLN
2.0000 g | Freq: Two times a day (BID) | INTRAVENOUS | Status: DC
  Administered 2023-12-09 – 2023-12-12 (×6): 2 g via INTRAVENOUS
  Filled 2023-12-09 (×6): qty 12.5

## 2023-12-09 MED ORDER — PREDNISONE 10 MG PO TABS
30.0000 mg | ORAL_TABLET | Freq: Every day | ORAL | Status: DC
  Administered 2023-12-10 – 2023-12-12 (×3): 30 mg via ORAL
  Filled 2023-12-09 (×3): qty 1

## 2023-12-09 MED ORDER — VANCOMYCIN HCL IN DEXTROSE 1-5 GM/200ML-% IV SOLN
1000.0000 mg | INTRAVENOUS | Status: DC
  Administered 2023-12-09 – 2023-12-11 (×3): 1000 mg via INTRAVENOUS
  Filled 2023-12-09 (×3): qty 200

## 2023-12-09 NOTE — Consult Note (Signed)
Palliative Care Consult Note                                  Date: 12/09/2023   Patient Name: [name]  DOB: 04/25/1961  MRN: [id number]  Age / Sex: 63 y.o., female  PCP: [name]., FNP Referring Physician: Noralee Stain, DO  Reason for Consultation: Establishing goals of care  HPI/Patient Profile: 63 y.o. 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  Chest x-ray showed persistent right upper lung air space opacity associated with cavitary lesion.  She is admitted with HCAP.  Of note, patient had previously undergone chemotherapy for her lung cancer, but it was discontinued due to severe side effects that led to hospitalization.  Palliative Medicine has been consulted for goals of care and medical decision making.  Subjective:   Extensive chart review has been completed prior to meeting with patient/family including labs, vital signs, imaging, progress/consult notes, orders, medications and available advance directive documents.   I met with patient at bedside to discuss diagnosis, prognosis, and goals of care.  Patient is known to PMT from previous hospitalizations.  She has also followed by Jerrol Banana) [place]Cousar NP at the palliative care clinic at the cancer center.   I re-introduced Palliative Medicine as specialized medical care for people living with serious illness.  Created space and opportunity for patient to express thoughts and feelings regarding current medical situation. Values and goals of care were attempted to be elicited.   Social History: Patient reports that she has 4 children (3 daughters and 1 son) as well as multiple grandchildren.  Functional Status: Patient lives at  home; her grandson lives with her.  She reports her children all live nearby.  She reports that things at home have been "pretty good".  She is ambulatory with a walker and able to perform basic ADLs.  Her son prepares the meals.  Today's Discussion: We discussed patient's current illness and what it means in the larger context of her ongoing co-morbidities. Current clinical status was reviewed.   I shared my concern that given her underlying COPD and lung cancer, patient has less reserve to recover from pneumonia or other acute illness.    Regarding her lung cancer, patient does verbalize understanding that it is "stage IV" and has "spread to her brain".   For now, patient desires to "treat the treatable" with medical management and supportive interventions. She is hopeful for medical stabilization.  She plans to "do the best she can with what she has been given" and is hopeful to feel as good as possible for as long as possible. She speaks to the concept that mortality is inevitable, and that she would want to be comfortable when that time comes.   I discussed with patient my recommendation to consider the addition of hospice support at home. I explained that hospice would be extra care for patient, support for family, and assistance with symptom management.  Patient does confirm that she was recently had a home visit by [facility] hospice.  She is unsure if she officially enrolled in services with them.  She shares that she initially did not feel that she really needed their services, but will  now reconsider.  We discussed advanced directives.  I asked patient who she would want to make medical decisions on her behalf if she was unable to make those decisions for herself.  She reports she would want her daughter [name] to be her surrogate decision maker, and another daughter [name] to be the alternate.  Questions and concerns were addressed.  Emotional support provided.   Review of  Systems  Constitutional:  Positive for fatigue.  Respiratory:  Positive for cough.     Objective:   Primary Diagnoses: Present on Admission:  Chronic respiratory failure with hypoxia and hypercapnia (HCC)  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   Sepsis (HCC)  HCAP (healthcare-associated pneumonia)  Lung cancer metastatic to brain (HCC)  Hyperlipidemia  Depression with anxiety  Anemia, chronic disease  (Resolved) Acute on chronic respiratory failure with hypoxia [facility])   Physical Exam Vitals reviewed.  Constitutional:      General: She is not in acute distress.    Appearance: She is ill-appearing.  Pulmonary:     Effort: No respiratory distress.     Comments: Congested cough Neurological:     Mental Status: She is alert and oriented to person, place, and time.    Palliative Assessment/Data: PPS 40-50%     Assessment & Plan:   SUMMARY OF RECOMMENDATIONS   Continue current scope of care - "treat the treatable" Spiritual care referral to assist with advanced directives I will reach out to [facility] to find out if patient is currently enrolled in services PMT will continue to follow  Primary Decision Maker: PATIENT  Code Status/Advance Care Planning: DNR - Limited  Symptom Management:  Per attending  Prognosis:  < 6 months would not be surprising  Discharge Planning:  To Be Determined    Thank you for allowing [place] to participate in the care of [name]   Time Total: 75 minutes  Detailed review of medical records (labs, imaging, vital signs), medically appropriate exam, discussed with treatment team, counseling and education to patient, family, & staff, documenting clinical information, coordination of care.   Signed by: Sherlean Foot, NP Palliative Medicine Team  Team Phone # [phone]  For individual providers, please see AMION

## 2023-12-09 NOTE — Plan of Care (Signed)
Problem: Health Behavior/Discharge Planning: Goal: Ability to manage health-related needs will improve Outcome: Progressing   Problem: Clinical Measurements: Goal: Will remain free from infection Outcome: Progressing   Problem: Clinical Measurements: Goal: Respiratory complications will improve Outcome: Progressing

## 2023-12-09 NOTE — Plan of Care (Signed)

## 2023-12-09 NOTE — Progress Notes (Signed)
PROGRESS NOTE    [name]  YQM:[id number] DOB: 18-Jul-1961 DOA: 12/06/2023 PCP: [name]., FNP     Brief Narrative:  [name] is a 63 y.o. 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 The patient also reported a recent change in her respiratory status, describing it as "funny," and a worsening cough. Occasionally, the cough has been productive and blood has been present.  Other associated symptoms include chills.   The patient had previously undergone chemotherapy for her lung cancer, but it  had to discontinue due to severe side effects that led to hospitalization. She had been scheduled for another round of chemotherapy but was unable to attend due to her current illness.   In addition to her lung cancer, the patient was recently placed under hospice care at home 6 days ago. However, she reported feeling unable to care for herself at home at present, leading to her request for hospital admission. The patient also has a Do Not Resuscitate (DNR) order in place.  She does want to treatment for any signs of infection.  New events last 24 hours / Subjective: Patient without any new complaints today  Assessment & Plan:   Principal Problem:   HCAP (healthcare-associated pneumonia) Active Problems:   Sepsis (HCC)   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    Chronic respiratory failure with hypoxia and hypercapnia (HCC)   Lung cancer metastatic to brain (HCC)   Anemia, chronic disease   Depression with anxiety   Hyperlipidemia   Generalized weakness   Severe sepsis secondary to HCAP -Sepsis present on admission with leukocytosis, tachycardia, lactic acidosis 4.9 -Chest x-ray showed persistent right upper lung air space opacity associated with  cavitary lesion -Vancomycin, cefepime -WBC trending down   COPD with chronic hypoxic respiratory failure -Uses 2 L nasal cannula O2 at baseline -Continue prednisone, breathing treatments  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, with mets to brain  -Patient was apparently enrolled in home hospice 1/17 -Ke[facility]ra -Palliative care consulted  Hyperlipidemia -Lipitor  Depression and anxiety -Zoloft    DVT prophylaxis:  enoxaparin (LOVENOX) injection 40 mg Start: 12/06/23 2000  Code Status: DNR Family Communication: No family at bedside Disposition Plan: TBD Status is: Inpatient Remains inpatient appropriate because: IV antibiotics    Antimicrobials:  Anti-infectives (From admission, onward)    Start     Dose/Rate Route Frequency Ordered Stop   12/09/23 1800  vancomycin (VANCOCIN) IVPB 1000 mg/200 mL premix       Placed in "Followed by" Linked Group   1,000 mg 200 mL/hr over 60 Minutes Intravenous Every 24 hours 12/09/23 0900     12/09/23 1800  ceFEPIme (MAXIPIME) 2 g in sodium chloride 0.9 % 100 mL IVPB        2 g 200 mL/hr over 30 Minutes Intravenous Every 12 hours 12/09/23 1030     12/07/23 1800  vancomycin (VANCOREADY) IVPB 1250 mg/250 mL  Status:  Discontinued       Placed in "Followed by" Linked Group   1,250 mg 166.7 mL/hr over 90 Minutes Intravenous Every 24 hours 12/06/23 1938 12/09/23 0900   12/06/23 2030  vancomycin (VANCOREADY) IVPB 1500 mg/300 mL       Placed in "Followed by" Linked Group   1,500 mg 150 mL/hr  over 120 Minutes Intravenous  Once 12/06/23 1938 12/07/23 0143   12/06/23 2015  ceFEPIme (MAXIPIME) 1 g in sodium chloride 0.9 % 100 mL IVPB  Status:  Discontinued        1 g 200 mL/hr over 30 Minutes Intravenous Every 8 hours 12/06/23 1919 12/09/23 1030        Objective: Vitals:   12/08/23 2028 12/08/23 2205 12/09/23 0813 12/09/23 0851  BP:  (!) 114/59  102/62  Pulse:  71  87  Resp:  18  17  Temp:  98 F (36.7 C)  98.9 F (37.2 C)   TempSrc:  Oral  Oral  SpO2: 100% 99% 97% 100%  Weight:      Height:        Intake/Output Summary (Last 24 hours) at 12/09/2023 1135 Last data filed at 12/09/2023 0900 Gross per 24 hour  Intake 1170 ml  Output --  Net 1170 ml   Filed Weights   12/07/23 0611  Weight: 64.7 kg    Examination:  General exam: Appears calm and comfortable  Respiratory system:  Respiratory effort normal. No respiratory distress. No conversational dyspnea.  On 2 L oxygen Cardiovascular system: S1 & S2 heard, RRR. No murmurs. No pedal edema. Gastrointestinal system: Abdomen is nondistended, soft and nontender. Normal bowel sounds heard. Central nervous system: Alert  Extremities: Symmetric in appearance  Skin: No rashes, lesions or ulcers on exposed skin   Data Reviewed: I have personally reviewed following labs and imaging studies  CBC: Recent Labs  Lab 12/06/23 0748 12/07/23 0637 12/08/23 0616 12/09/23 0403  WBC 24.8* 29.5* 25.5* 17.4*  HGB 9.7* 9.1* 8.2* 7.5*  HCT 31.2* 28.4* 25.7* 23.8*  MCV 90.2 87.4 87.4 88.8  PLT 256 281 282 251   Basic Metabolic Panel: Recent Labs  Lab 12/06/23 0748 12/07/23 0637 12/08/23 0616 12/09/23 0403  NA 142 140 138 141  K 3.5 3.5 3.2* 3.6  CL 97* 99 99 102  CO2 34* 29 28 30   GLUCOSE 91 123* 119* 110*  BUN 9 15 17 18   CREATININE 0.83 0.86 0.98 1.00  CALCIUM 8.6* 7.7* 7.7* 7.9*  MG  --  1.4* 2.1 2.0   GFR: Estimated Creatinine Clearance: 51.8 mL/min (by C-G formula based on SCr of 1 mg/dL). Liver Function Tests: Recent Labs  Lab 12/07/23 0637  AST 12*  ALT 8  ALKPHOS 111  BILITOT 0.8  PROT 5.2*  ALBUMIN 2.1*   No results for input(s): "LIPASE", "AMYLASE" in the last 168 hours. No results for input(s): "AMMONIA" in the last 168 hours. Coagulation Profile: No results for input(s): "INR", "PROTIME" in the last 168 hours. Cardiac Enzymes: No results for input(s): "CKTOTAL", "CKMB", "CKMBINDEX", "TROPONINI" in the last 168 hours. BNP (last  3 results) No results for input(s): "PROBNP" in the last 8760 hours. HbA1C: No results for input(s): "HGBA1C" in the last 72 hours. CBG: No results for input(s): "GLUCAP" in the last 168 hours. Lipid Profile: No results for input(s): "CHOL", "HDL", "LDLCALC", "TRIG", "CHOLHDL", "LDLDIRECT" in the last 72 hours. Thyroid Function Tests: No results for input(s): "TSH", "T4TOTAL", "FREET4", "T3FREE", "THYROIDAB" in the last 72 hours. Anemia Panel: No results for input(s): "VITAMINB12", "FOLATE", "FERRITIN", "TIBC", "IRON", "RETICCTPCT" in the last 72 hours. Sepsis Labs: Recent Labs  Lab 12/06/23 0929 12/06/23 2108 12/07/23 0004 12/07/23 0637  PROCALCITON 0.88  --   --   --   LATICACIDVEN  --  4.9* 2.4* 1.2    Recent Results (from the past 240  hours)  Resp panel by RT-PCR (RSV, Flu A&B, Covid) Anterior Nasal Swab     Status: None   Collection Time: 12/06/23  9:15 AM   Specimen: Anterior Nasal Swab  Result Value Ref Range Status   SARS Coronavirus 2 by RT PCR NEGATIVE NEGATIVE Final   Influenza A by PCR NEGATIVE NEGATIVE Final   Influenza B by PCR NEGATIVE NEGATIVE Final    Comment: (NOTE) The Xpert Xpress SARS-CoV-2/FLU/RSV plus assay is intended as an aid in the diagnosis of influenza from Nasopharyngeal swab specimens and should not be used as a sole basis for treatment. Nasal washings and aspirates are unacceptable for Xpert Xpress SARS-CoV-2/FLU/RSV testing.  Fact Sheet for Patients: BloggerCourse.com  Fact Sheet for Healthcare Providers: SeriousBroker.it  This test is not yet approved or cleared by the [place] FDA and has been authorized for detection and/or diagnosis of SARS-CoV-2 by FDA under an Emergency Use Authorization (EUA). This EUA will remain in effect (meaning this test can be used) for the duration of the COVID-19 declaration under Section 564(b)(1) of the Act, 21 U.S.C. section 360bbb-3(b)(1), unless  the authorization is terminated or revoked.     Resp Syncytial Virus by PCR NEGATIVE NEGATIVE Final    Comment: (NOTE) Fact Sheet for Patients: BloggerCourse.com  Fact Sheet for Healthcare Providers: SeriousBroker.it  This test is not yet approved or cleared by the [place] FDA and has been authorized for detection and/or diagnosis of SARS-CoV-2 by FDA under an Emergency Use Authorization (EUA). This EUA will remain in effect (meaning this test can be used) for the duration of the COVID-19 declaration under Section 564(b)(1) of the Act, 21 U.S.C. section 360bbb-3(b)(1), unless the authorization is terminated or revoked.  Performed at [facility], [address], [place], [place] [address]   MRSA Next Gen by PCR, Nasal     Status: Abnormal   Collection Time: 12/06/23  7:32 PM   Specimen: Nasal Mucosa; Nasal Swab  Result Value Ref Range Status   MRSA by PCR Next Gen DETECTED (A) NOT DETECTED Final    Comment: RESULT CALLED TO, READ BACK BY AND VERIFIED WITH: M SHRESTHA 12/07/2023 @ 0517 BY AB (NOTE) The GeneXpert MRSA Assay (FDA approved for NASAL specimens only), is one component of a comprehensive MRSA colonization surveillance program. It is not intended to diagnose MRSA infection nor to guide or monitor treatment for MRSA infections. Test performance is not FDA approved in patients less than 24 years old. Performed at [facility], [address], [place], [place] [address]   Culture, blood (single) w Reflex to ID Panel     Status: None (Preliminary result)   Collection Time: 12/06/23  9:09 PM   Specimen: BLOOD  Result Value Ref Range Status   Specimen Description BLOOD SITE NOT SPECIFIED  Final   Special Requests   Final    BOTTLES DRAWN AEROBIC AND ANAEROBIC Blood Culture results may not be optimal due to an inadequate volume of blood received in culture bottles   Culture   Final    NO GROWTH 3  DAYS Performed at [facility], [address] [place], [place] [address]    Report Status PENDING  Incomplete      Radiology Studies: No results found.     Scheduled Meds:  arformoterol  15 mcg Nebulization BID   aspirin  81 mg Oral Daily   atorvastatin  20 mg Oral Daily   budesonide (PULMICORT) nebulizer solution  0.5  mg Nebulization BID   Chlorhexidine Gluconate Cloth  6 each Topical Daily   enoxaparin (LOVENOX) injection  40 mg Subcutaneous Q24H   feeding supplement  237 mL Oral BID BM   guaiFENesin  600 mg Oral BID   levETIRAcetam  750 mg Oral Daily   metoprolol tartrate  25 mg Oral Daily   montelukast  10 mg Oral QHS   pantoprazole  40 mg Oral Daily   predniSONE  40 mg Oral Q breakfast   sertraline  50 mg Oral Daily   sodium chloride flush  10-40 mL Intracatheter Q12H   sodium chloride flush  3 mL Intravenous Q12H   Continuous Infusions:  ceFEPime (MAXIPIME) IV     vancomycin       LOS: 2 days   Time spent: 25 minutes   Noralee Stain, DO [facility] 12/09/2023, 11:35 AM   Available via Epic secure chat 7am-7pm After these hours, please refer to coverage provider listed on [email or web address]

## 2023-12-09 NOTE — Progress Notes (Addendum)
PHARMACY ANTIBIOTIC CONSULT NOTE   [name] a 63 y.o. female p/w AoC resp failure. Coming from [facility]. Pharmacy has been consulted for vancomycin dosing.  1/26: Scr 0.83 > 1, WBC 24.8 > 17.4, afebrile   Estimated Creatinine Clearance: 51.8 mL/min (by C-G formula based on SCr of 1 mg/dL).  Plan: Cefepime 2 g IV Q12H   Decrease vancomycin from 1250 mg IV Q24H to 1000 mg IV Q24H (Scr used: 1, Vd used: 0.72, eAUC: 453) Monitor renal function, clinical status, de-escalation, C/S, levels as indicated   Allergies:  Allergies  Allergen Reactions   Codeine Other (See Comments)    Avoids-felt bad when taken before Pt claims she can have this but does not like to take    [facility] Weights   12/07/23 0611  Weight: 64.7 kg (142 lb 10.2 oz)       Latest Ref Rng & Units 12/09/2023    4:03 AM 12/08/2023    6:16 AM 12/07/2023    6:37 AM  CBC  WBC 4.0 - 10.5 K/uL 17.4  25.5  29.5   Hemoglobin 12.0 - 15.0 g/dL 7.5  8.2  9.1   Hematocrit 36.0 - 46.0 % 23.8  25.7  28.4   Platelets 150 - 400 K/uL 251  282  281     Antibiotics Given (last 72 hours)     Date/Time Action Medication Dose Rate   12/06/23 2216 New Bag/Given   ceFEPIme (MAXIPIME) 1 g in sodium chloride 0.9 % 100 mL IVPB 1 g 200 mL/hr   12/06/23 2343 New Bag/Given   vancomycin (VANCOREADY) IVPB 1500 mg/300 mL 1,500 mg 150 mL/hr   12/07/23 0631 New Bag/Given   ceFEPIme (MAXIPIME) 1 g in sodium chloride 0.9 % 100 mL IVPB 1 g 200 mL/hr   12/07/23 1355 New Bag/Given   ceFEPIme (MAXIPIME) 1 g in sodium chloride 0.9 % 100 mL IVPB 1 g 200 mL/hr   12/07/23 1838 New Bag/Given   vancomycin (VANCOREADY) IVPB 1250 mg/250 mL 1,250 mg 166.7 mL/hr   12/08/23 0002 New Bag/Given   ceFEPIme (MAXIPIME) 1 g in sodium chloride 0.9 % 100 mL IVPB 1 g 200 mL/hr   12/08/23 0524 New Bag/Given   ceFEPIme (MAXIPIME) 1 g in sodium chloride 0.9 % 100 mL IVPB 1 g 200 mL/hr   12/08/23 1423 New Bag/Given   ceFEPIme (MAXIPIME) 1 g in sodium chloride  0.9 % 100 mL IVPB 1 g 200 mL/hr   12/08/23 1742 New Bag/Given   vancomycin (VANCOREADY) IVPB 1250 mg/250 mL 1,250 mg 166.7 mL/hr   12/08/23 2233 New Bag/Given   ceFEPIme (MAXIPIME) 1 g in sodium chloride 0.9 % 100 mL IVPB 1 g 200 mL/hr   12/09/23 0516 New Bag/Given   ceFEPIme (MAXIPIME) 1 g in sodium chloride 0.9 % 100 mL IVPB 1 g 200 mL/hr       Antimicrobials this admission: Vanc 1/23>> CFP 1/23>>   Microbiology results: 4-plex PCR negative 1/23 MRSA PCR: + 1/23 Bcx: NGTD  Thank you for allowing pharmacy to be a part of this patient's care.  Roslyn Smiling, PharmD PGY1 Pharmacy Resident 12/09/2023 8:57 AM

## 2023-12-10 ENCOUNTER — Ambulatory Visit (HOSPITAL_COMMUNITY): Admission: RE | Admit: 2023-12-10 | Payer: 59 | Source: Ambulatory Visit

## 2023-12-10 DIAGNOSIS — J441 Chronic obstructive pulmonary disease with (acute) exacerbation: Secondary | ICD-10-CM | POA: Diagnosis not present

## 2023-12-10 DIAGNOSIS — Z7189 Other specified counseling: Secondary | ICD-10-CM

## 2023-12-10 DIAGNOSIS — R051 Acute cough: Secondary | ICD-10-CM | POA: Diagnosis not present

## 2023-12-10 DIAGNOSIS — Z515 Encounter for palliative care: Secondary | ICD-10-CM | POA: Diagnosis not present

## 2023-12-10 DIAGNOSIS — Z789 Other specified health status: Secondary | ICD-10-CM

## 2023-12-10 DIAGNOSIS — J189 Pneumonia, unspecified organism: Secondary | ICD-10-CM | POA: Diagnosis not present

## 2023-12-10 DIAGNOSIS — Z66 Do not resuscitate: Secondary | ICD-10-CM

## 2023-12-10 DIAGNOSIS — Z711 Person with feared health complaint in whom no diagnosis is made: Secondary | ICD-10-CM

## 2023-12-10 LAB — BASIC METABOLIC PANEL
Anion gap: 8 (ref 5–15)
BUN: 18 mg/dL (ref 8–23)
CO2: 29 mmol/L (ref 22–32)
Calcium: 8.1 mg/dL — ABNORMAL LOW (ref 8.9–10.3)
Chloride: 101 mmol/L (ref 98–111)
Creatinine, Ser: 0.85 mg/dL (ref 0.44–1.00)
GFR, Estimated: >60 mL/min (ref >60)
Glucose, Bld: 127 mg/dL — ABNORMAL HIGH (ref 70–99)
Potassium: 3.4 mmol/L — ABNORMAL LOW (ref 3.5–5.1)
Sodium: 138 mmol/L (ref 135–145)

## 2023-12-10 LAB — CBC
HCT: 24.3 % — ABNORMAL LOW (ref 36.0–46.0)
Hemoglobin: 7.5 g/dL — ABNORMAL LOW (ref 12.0–15.0)
MCH: 27.9 pg (ref 26.0–34.0)
MCHC: 30.9 g/dL (ref 30.0–36.0)
MCV: 90.3 fL (ref 80.0–100.0)
Platelets: 235 10*3/uL (ref 150–400)
RBC: 2.69 MIL/uL — ABNORMAL LOW (ref 3.87–5.11)
RDW: 15 % (ref 11.5–15.5)
WBC: 15.4 10*3/uL — ABNORMAL HIGH (ref 4.0–10.5)
nRBC: 0 % (ref 0.0–0.2)

## 2023-12-10 LAB — MAGNESIUM: Magnesium: 1.8 mg/dL (ref 1.7–2.4)

## 2023-12-10 MED ORDER — POTASSIUM CHLORIDE CRYS ER 20 MEQ PO TBCR
40.0000 meq | EXTENDED_RELEASE_TABLET | Freq: Once | ORAL | Status: AC
Start: 2023-12-10 — End: 2023-12-10
  Administered 2023-12-10: 40 meq via ORAL
  Filled 2023-12-10: qty 2

## 2023-12-10 MED ORDER — MUPIROCIN 2 % EX OINT
TOPICAL_OINTMENT | Freq: Two times a day (BID) | CUTANEOUS | Status: DC
  Filled 2023-12-10 (×3): qty 22

## 2023-12-10 NOTE — Plan of Care (Signed)

## 2023-12-10 NOTE — Progress Notes (Signed)
Daily Progress Note   Patient Name: [name]       Date: 12/10/2023 DOB: [birth year]  Age: 63 y.o. MRN#: [id number] Attending Physician: Noralee Stain, DO Primary Care Physician: [name]., FNP Admit Date: 12/06/2023  Reason for Consultation/Follow-up: Establishing goals of care  Subjective: I have reviewed medical records including EPIC notes, MAR, any available advanced directives as necessary, and labs. Received report from primary RN - no acute concerns.   Went to visit patient at bedside - no family/visitors present. Patient was sitting in bed eating lunch - ate about 20%. No signs or non-verbal gestures of pain or discomfort noted. No respiratory distress, increased work of breathing, or secretions noted. She is on 2L O2 [place].  Emotional support provided to patient. Therapeutic listening provided as she reflects on plan for discharge to rehab. Anticipatory care planning reviewed in detail - it is anticipated patient will continue to decline and she is at high risk for rehospitalization. Discussed option of outpatient Palliative Care - patient is agreeable. Reviewed they could transition her back into hospice care after rehab vs if she had further decline - patient's goal would be to re-enroll in hospice after rehab/try to prevent re-hospitalization.   Called patient's daughter/[name] - provided updates per my assessment and conversation with patient as above. [name] confirms that family cannot provide total care for patient at home. [name] supports patient's decisions for outpatient Palliative Care and no rehospitalization. We discussed the benefit of a MOST form - will try to complete with patient tomorrow.   All questions and concerns addressed. Encouraged to call with questions  and/or concerns. PMT number provided.  3:00 PM Noted PT/OT notes recommending home health - will discuss option of discharging back home with hospice vs home health with patient/family tomorrow.  Length of Stay: 3  Current Medications: Scheduled Meds:   arformoterol  15 mcg Nebulization BID   aspirin  81 mg Oral Daily   atorvastatin  20 mg Oral Daily   budesonide (PULMICORT) nebulizer solution  0.5 mg Nebulization BID   Chlorhexidine Gluconate Cloth  6 each Topical Daily   enoxaparin (LOVENOX) injection  40 mg Subcutaneous Q24H   feeding supplement  237 mL Oral BID BM   guaiFENesin  600 mg Oral BID   levETIRAcetam  750 mg Oral Daily  metoprolol tartrate  25 mg Oral Daily   montelukast  10 mg Oral QHS   mupirocin ointment   Nasal BID   pantoprazole  40 mg Oral Daily   predniSONE  30 mg Oral Q breakfast   sertraline  50 mg Oral Daily   sodium chloride flush  10-40 mL Intracatheter Q12H   sodium chloride flush  3 mL Intravenous Q12H    Continuous Infusions:  ceFEPime (MAXIPIME) IV 2 g (12/10/23 0457)   vancomycin 1,000 mg (12/09/23 1742)    PRN Meds: acetaminophen **OR** acetaminophen, albuterol, HYDROcodone-acetaminophen, sodium chloride flush  Physical Exam Vitals and nursing note reviewed.  Constitutional:      General: She is not in acute distress. Pulmonary:     Effort: No respiratory distress.  Skin:    General: Skin is warm and dry.  Neurological:     Mental Status: She is alert and oriented to person, place, and time.  Psychiatric:        Attention and Perception: Attention normal.        Behavior: Behavior is cooperative.        Cognition and Memory: Cognition and memory normal.             Vital Signs: BP 133/67 (BP Location: Left Arm)   Pulse 84   Temp 98.3 F (36.8 C) (Oral)   Resp 18   Ht 5\' 5"  (1.651 m)   Wt 64.7 kg   SpO2 100%   BMI 23.74 kg/m  SpO2: SpO2: 100 % O2 Device: O2 Device: Nasal Cannula O2 Flow Rate: O2 Flow Rate (L/min): 2  L/min  Intake/output summary:  Intake/Output Summary (Last 24 hours) at 12/10/2023 1235 Last data filed at 12/09/2023 2229 Gross per 24 hour  Intake 748.08 ml  Output --  Net 748.08 ml   LBM: Last BM Date : 12/10/23 Baseline Weight: Weight: 64.7 kg Most recent weight: Weight: 64.7 kg       Palliative Assessment/Data: PPS 60-70%      Patient Active Problem List   Diagnosis Date Noted   FTT (failure to thrive) in adult 11/14/2023   QT prolongation 11/14/2023   Generalized body aches 11/13/2023   HCAP (healthcare-associated pneumonia) 11/03/2023   Generalized weakness 10/13/2023   Thrombocytopenia (HCC) 10/13/2023   Malignant neoplasm of right lung (HCC) 10/13/2023   Sepsis (HCC) 10/13/2023   Fever and neutropenia (HCC) 10/12/2023   Goals of care, counseling/discussion 09/27/2023   Acute metabolic encephalopathy 09/17/2023   Cerebral edema (HCC) 08/31/2023   Acute metabolic encephalopathy 08/28/2023   Community acquired pneumonia of right upper lobe of lung 08/28/2023   AKI (acute kidney injury) (HCC) 08/27/2023   Lung cancer metastatic to brain (HCC) 08/08/2023   Port-A-Cath in place 08/07/2023   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) 07/24/2023   Squamous cell lung cancer, 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   Frontal mass of brain 06/26/2023   Mass of upper lobe of right lung 06/26/2023   DNR (do not resuscitate) 06/26/2023   Cirrhosis (HCC) 03/14/2022   Benign neoplasm of ascending colon    Anemia, chronic disease 01/10/2019   Chronic cough 11/26/2017   Depression    Obesity (BMI 30-39.9) 01/10/2017   Dyspnea on exertion 11/09/2016   Chronic respiratory failure with hypoxia and hypercapnia (HCC) 10/29/2016   Depression with anxiety 08/07/2016   Hot flashes 02/01/2016   Hypokalemia    Breast cancer of upper-outer quadrant of right female breast (HCC) 08/04/2015   GERD (gastroesophageal reflux disease)  12/21/2012   Hyperlipidemia 12/21/2012   Solitary  pulmonary nodule on lung CT 12/21/2012   SICKLE-CELL TRAIT 04/15/2008   Sarcoidosis (HCC) 01/10/2007   Hypertension 01/10/2007   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  01/10/2007   BACK PAIN, LOW 01/10/2007    Palliative Care Assessment & Plan   Patient Profile: 63 y.o. 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  Chest x-ray showed persistent right upper lung air space opacity associated with cavitary lesion.  She is admitted with HCAP.   Of note, patient had previously undergone chemotherapy for her lung cancer, but it was discontinued due to severe side effects that led to hospitalization.  Assessment: Principal Problem:   HCAP (healthcare-associated pneumonia) Active Problems:   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    Hyperlipidemia   Depression with anxiety   Chronic respiratory failure with hypoxia and hypercapnia (HCC)   Anemia, chronic disease   Lung cancer metastatic to brain [facility])   Generalized weakness   Sepsis (HCC)   Concern about end of life  Recommendations/Plan: Continue to treat the treatable Continue DNR/DNI as previously documented - durable DNR form completed and placed in shadow chart. Copy was made and will be scanned into Vynca/ACP tab PT/OT recommending HH - will discuss options of returning home with hospice vs [facility] with patient/family tomorrow 1/28 PMT will continue to follow and support holistically  Goals of Care and Additional Recommendations: Limitations on Scope of Treatment: Avoid Hospitalization, Full Scope Treatment, No Artificial Feeding, and No Tracheostomy  Code Status:    Code Status Orders  (From admission, onward)           Start     Ordered   12/06/23 1548  Do  not attempt resuscitation (DNR)- Limited -Do Not Intubate (DNI)  Continuous       Question Answer Comment  If pulseless and not breathing No CPR or chest compressions.   In Pre-Arrest Conditions (Patient Is Breathing and Has A Pulse) Do not intubate. Provide all appropriate non-invasive medical interventions. Avoid ICU transfer unless indicated or required.   Consent: Discussion documented in EHR or advanced directives reviewed      12/06/23 1551           Code Status History     Date Active Date Inactive Code Status Order ID Comments User Context   11/13/2023 2222 11/15/2023 2354 Limited: Do not attempt resuscitation (DNR) -DNR-LIMITED -Do Not Intubate/DNI  [id number]  Darlin Drop, DO ED   11/03/2023 1007 11/09/2023 1927 Do not attempt resuscitation (DNR) PRE-ARREST INTERVENTIONS DESIRED [id number]  Maryln Gottron, MD ED   10/12/2023 1438 10/19/2023 1744 Do not attempt resuscitation (DNR) PRE-ARREST INTERVENTIONS DESIRED [id number]  Steffanie Rainwater, MD ED   09/17/2023 1756 09/21/2023 2209 Limited: Do not attempt resuscitation (DNR) -DNR-LIMITED -Do Not Intubate/DNI  [id number]  Alessandra Bevels, MD ED   08/27/2023 1653 09/01/2023 1836 Limited: Do not attempt resuscitation (DNR) -DNR-LIMITED -Do Not Intubate/DNI  [id number]  Maryln Gottron, MD ED   08/01/2023 1159 08/02/2023 0507 Full Code [id number]  Gilmer Mor, DO HOV   06/26/2023 2136 06/28/2023 1638 DNR [id number]  Carollee Herter, DO ED   06/26/2023 2102 06/26/2023 2136 DNR [id number]  Carollee Herter, DO ED   02/19/2019  2253 02/21/2019 1603 Full Code [id number]  Eduard Clos, MD ED   12/17/2018 1806 12/22/2018 2111 Full Code [id number]  Lorin Glass, MD Inpatient   09/08/2017 0012 09/09/2017 1821 Full Code [id number]  Lovena Neighbours, MD Inpatient   10/06/2016 2241 10/10/2016 1928 Full Code [id number]  Michael Litter, MD Inpatient   10/08/2015 1814 10/11/2015 1714 Full Code [id number]  Bonney Aid, MD Inpatient      Advance Directive  Documentation    Flowsheet Row Most Recent Value  Type of Advance Directive Out of facility DNR (pink MOST or yellow form)  Pre-existing out of facility DNR order (yellow form or pink MOST form) --  "MOST" Form in Place? --       Prognosis:  < 6 months  Discharge Planning: To Be Determined  Care plan was discussed with primary RN, patient, patient's family, Dr. Alvino Chapel, [facility]  Thank you for allowing the Palliative Medicine Team to assist in the care of this patient.   Total Time 50 minutes Prolonged Time Billed  no       Haskel Khan, NP  Please contact Palliative Medicine Team phone at [phone] for questions and concerns.   *Portions of this note are a verbal dictation therefore any spelling and/or grammatical errors are due to the "[employer] One" system interpretation.

## 2023-12-10 NOTE — Progress Notes (Signed)
Palliative Medicine Progress Note   Patient Name: [name]       Date: 12/10/2023 DOB: 1960/12/03  Age: 63 y.o. MRN#: [id number] Attending Physician: Noralee Stain, DO Primary Care Physician: [name]., FNP Admit Date: 12/06/2023    HPI/Patient Profile: 63 y.o. 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  Chest x-ray showed persistent right upper lung air space opacity associated with cavitary lesion.  She is admitted with HCAP.   Of note, patient had previously undergone chemotherapy for her lung cancer, but it was discontinued due to severe side effects that led to hospitalization.   Palliative Medicine has been consulted for goals of care and medical decision making.  Subjective: Chart reviewed and updates received. I called [facility] and was able to speak with an Charity fundraiser. She reports that patient was indeed enrolled in hospice services on 1/18, but was discharged from services on 1/23 when she was admitted to the hospital.   I later saw patient at bedside. Her RN has just helped her back to bed from the [facility].  She has no new complaints.  She is on her baseline amount of oxygen.   I explained to patient that she would be evaluated by PT/OT to assess whether she was safe to return home versus needing short-term rehab.  Patient states she would be willing to go to rehab if needed.   Objective:  Physical Exam Vitals reviewed.  Constitutional:      General: She is not in acute distress.    Appearance: She is ill-appearing.  Pulmonary:     Effort: No  respiratory distress.  Neurological:     Mental Status: She is alert and oriented to person, place, and time.              Palliative Medicine Assessment & Plan   Assessment: Principal Problem:   HCAP (healthcare-associated pneumonia) Active Problems:   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    Hyperlipidemia   Depression with anxiety   Chronic respiratory failure with hypoxia and hypercapnia (HCC)   Anemia, chronic disease  Lung cancer metastatic to brain [facility])   Generalized weakness   Sepsis (HCC)    Recommendations/Plan: Continue current scope of care - "treat the treatable" Spiritual care referral to assist with advanced directives PT evaluation pending - will need recommendation on if patient safe to return back home with family support vs if needs SNF/rehab If safe to return home, would recommend hospice support (TOC to offer choice as patient not currently enrolled with a hospice agency) If needing SNF/rehab, would recommend outpatient palliative which could later transition to hospice upon patient returning home PMT will continue to follow   Primary Decision Maker: PATIENT   Code Status/Advance Care Planning: DNR - Limited  Prognosis:  < 6 months would not be surprising  Discharge Planning: To Be Determined   Thank you for allowing the Palliative Medicine Team to assist in the care of this patient.   Time: 30 minutes   Merry Proud, NP   Please contact Palliative Medicine Team phone at [phone] for questions and concerns.  For individual providers, please see AMION.

## 2023-12-10 NOTE — Evaluation (Signed)
Physical Therapy Evaluation Patient Details Name: [name] MRN: [id number] DOB: [birth year] Today's Date: 12/10/2023  History of Present Illness  63 y/o female presenting to ED on 1/23 with chief complaints of SOB, cough, and chest pain. Chest xray with persistent R upper lung air space opacity associated with cavitary lesion. Admitted with severe sepsis due to HCAP. 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 Pt presents with decreased cardiopulmonary endurance, impaired balance, and cognition. Pt requiring min assist for transfers and ambulating 30 ft x 2 with a walker and chair follow. SpO2 > 92% on 2L O2. Pt reports mild dizziness that resolves with seated rest break. Pt A&O to self and place, but not time. Pt reports she has other family members available for supervision during the day while her grandson works, which would be recommended. Will continue to follow acutely to address deficits and promote mobility while inpatient.       If plan is discharge home, recommend the following: A little help with walking and/or transfers;Assist for transportation;Help with stairs or ramp for entrance;A little help with bathing/dressing/bathroom;Direct supervision/assist for medications management;Direct supervision/assist for financial management   Can travel by private vehicle        Equipment Recommendations None recommended by PT  Recommendations for Other Services       Functional Status Assessment Patient has had a recent decline in their functional status and demonstrates the ability to make significant improvements in function in a reasonable and predictable amount of time.     Precautions / Restrictions Precautions Precautions: Fall Precaution  Comments: 2L baseline, Port Restrictions [employer] Bearing Restrictions Per Provider Order: No      Mobility  Bed Mobility Overal bed mobility: Needs Assistance Bed Mobility: Supine to Sit     Supine to sit: Contact guard     General bed mobility comments: no assist required, CGAfor safety and lines    Transfers Overall transfer level: Needs assistance Equipment used: Rolling walker (2 wheels) Transfers: Sit to/from Stand Sit to Stand: Contact guard assist, Min assist           General transfer comment: min guard from EOB but min assist from low commode and couch; cueing for hand placement and technique    Ambulation/Gait Ambulation/Gait assistance: Contact guard assist Gait Distance (Feet): 60 Feet (30", 30") Assistive device: Rolling walker (2 wheels) Gait Pattern/deviations: Step-through pattern, Decreased weight shift to right Gait velocity: decreased Gait velocity interpretation: <1.8 ft/sec, indicate of risk for recurrent falls   General Gait Details: Slow and steady pace, CGA for stability and chair follow utilized. Verbal cues for activity pacing. Pt reporting R knee discomfort  Stairs            Wheelchair Mobility     Tilt Bed    Modified Rankin (Stroke Patients Only)       Balance Overall balance assessment: Needs assistance Sitting-balance support: No upper extremity supported, Feet supported Sitting balance-Leahy Scale: Good     Standing balance support: Bilateral upper extremity supported, No upper extremity supported, During functional activity Standing balance-Leahy Scale: Fair Standing balance comment: relies on RW dynamically  Pertinent Vitals/Pain Pain Assessment Pain Assessment: Faces Faces Pain Scale: Hurts a little bit Pain Location: R knee Pain Descriptors / Indicators: Discomfort Pain Intervention(s): Monitored during session    Home Living Family/patient expects to be discharged  to:: Private residence Living Arrangements: Children Available Help at Discharge: Family Type of Home: Other(Comment) (condo) Home Access: Stairs to enter Entrance Stairs-Rails: Can reach both Entrance Stairs-Number of Steps: 5   Home Layout: One level Home Equipment: Agricultural consultant (2 wheels);Rollator (4 wheels) Additional Comments: Oxygen at home, reports alone during day and son just started a new job but can see if her daughter can assist    Prior Function Prior Level of Function : Independent/Modified Independent;Patient poor historian/Family not available             Mobility Comments: PRN use of w/c and rollator ADLs Comments: Independent, ADLs and light IADLs     Extremity/Trunk Assessment   Upper Extremity Assessment Upper Extremity Assessment: Right hand dominant    Lower Extremity Assessment Lower Extremity Assessment: Overall WFL for tasks assessed    Cervical / Trunk Assessment Cervical / Trunk Assessment: Normal  Communication   Communication Communication: No apparent difficulties  Cognition Arousal: Alert Behavior During Therapy: Flat affect Overall Cognitive Status: No family/caregiver present to determine baseline cognitive functioning                                 General Comments: pt oriented to person, not to time.  Follows simple commands but demonstrates decreased recall and probelm solving.  Questionable historian.        General Comments General comments (skin integrity, edema, etc.): VSS on 2L, O2 maintained >92%    Exercises     Assessment/Plan    PT Assessment Patient needs continued PT services  PT Problem List Decreased strength;Decreased activity tolerance;Decreased balance;Decreased mobility;Decreased knowledge of use of DME       PT Treatment Interventions DME instruction;Gait training;Functional mobility training;Therapeutic activities;Therapeutic exercise;Patient/family education;Balance training;Cognitive  remediation    PT Goals (Current goals can be found in the Care Plan section)  Acute Rehab PT Goals Patient Stated Goal: to go home PT Goal Formulation: With patient Time For Goal Achievement: 12/24/23 Potential to Achieve Goals: Good    Frequency Min 1X/week     Co-evaluation PT/OT/SLP Co-Evaluation/Treatment: Yes Reason for Co-Treatment: For patient/therapist safety;To address functional/ADL transfers;Other (comment) (activity tolerance) PT goals addressed during session: Mobility/safety with mobility OT goals addressed during session: ADL's and self-care       AM-PAC PT "6 Clicks" Mobility  Outcome Measure Help needed turning from your back to your side while in a flat bed without using bedrails?: None Help needed moving from lying on your back to sitting on the side of a flat bed without using bedrails?: A Little Help needed moving to and from a bed to a chair (including a wheelchair)?: A Little Help needed standing up from a chair using your arms (e.g., wheelchair or bedside chair)?: A Little Help needed to walk in hospital room?: A Little Help needed climbing 3-5 steps with a railing? : A Lot 6 Click Score: 18    End of Session Equipment Utilized During Treatment: Gait belt;Oxygen Activity Tolerance: Patient tolerated treatment well Patient left: in chair;with call bell/phone within reach;with chair alarm set Nurse Communication: Mobility status PT Visit Diagnosis: Muscle weakness (generalized) (M62.81);Difficulty in walking, not elsewhere classified (R26.2)    Time: 1610-9604 PT Time  Calculation (min) (ACUTE ONLY): 38 min   Charges:   PT Evaluation $PT Eval Low Complexity: 1 Low PT Treatments $Therapeutic Activity: 8-22 mins PT General Charges $$ ACUTE PT VISIT: 1 Visit         Lillia Pauls, PT, DPT Acute Rehabilitation Services Office [phone]   Norval Morton 12/10/2023, 9:31 AM

## 2023-12-10 NOTE — Progress Notes (Signed)
PROGRESS NOTE    [name]  ZOX:[id number] DOB: [birth year] DOA: 12/06/2023 PCP: [name]., FNP     Brief Narrative:  [name] is a 63 y.o. 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 The patient also reported a recent change in her respiratory status, describing it as "funny," and a worsening cough. Occasionally, the cough has been productive and blood has been present.  Other associated symptoms include chills.   The patient had previously undergone chemotherapy for her lung cancer, but it  had to discontinue due to severe side effects that led to hospitalization. She had been scheduled for another round of chemotherapy but was unable to attend due to her current illness.   In addition to her lung cancer, the patient was recently placed under hospice care at home 6 days ago. However, she reported feeling unable to care for herself at home at present, leading to her request for hospital admission. The patient also has a Do Not Resuscitate (DNR) order in place.  She does want to treatment for any signs of infection.  New events last 24 hours / Subjective: Patient up in chair today.  Her sister and niece are at bedside.  She states that she feels unsteady still and weak, wants to go to rehab for she goes home.  Assessment & Plan:   Principal Problem:   HCAP (healthcare-associated pneumonia) Active Problems:   Sepsis (HCC)   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    Chronic respiratory failure with hypoxia and hypercapnia (HCC)   Lung cancer metastatic to brain (HCC)   Anemia, chronic disease   Depression with anxiety   Hyperlipidemia   Generalized weakness   Severe sepsis secondary to HCAP -Sepsis present on admission with leukocytosis,  tachycardia, lactic acidosis 4.9 -Chest x-ray showed persistent right upper lung air space opacity associated with cavitary lesion -Vancomycin, cefepime -WBC trending down   COPD with chronic hypoxic respiratory failure -Uses 2 L nasal cannula O2 at baseline -Continue prednisone, breathing treatments  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, with mets to brain  -Patient was apparently enrolled in home hospice 1/17 -Keppra -Palliative care consulted  Hyperlipidemia -Lipitor  Depression and anxiety -Zoloft  Hypokalemia -Replace    DVT prophylaxis:  enoxaparin (LOVENOX) injection 40 mg Start: 12/06/23 2000  Code Status: DNR Family Communication: Sister and niece at bedside Disposition Plan: TBD Status is: Inpatient Remains inpatient appropriate because: IV antibiotics, TOC consulted for SNF placement    Antimicrobials:  Anti-infectives (From admission, onward)    Start     Dose/Rate Route Frequency Ordered Stop   12/09/23 1800  vancomycin (VANCOCIN) IVPB 1000 mg/200 mL premix       Placed in "Followed by" Linked Group   1,000 mg 200 mL/hr over 60 Minutes Intravenous Every 24 hours 12/09/23 0900     12/09/23 1800  ceFEPIme (MAXIPIME) 2 g in sodium chloride 0.9 % 100 mL IVPB        2 g 200 mL/hr over 30 Minutes Intravenous Every 12 hours 12/09/23 1030     12/07/23 1800  vancomycin (VANCOREADY) IVPB 1250 mg/250 mL  Status:  Discontinued       Placed in "Followed by" Linked Group   1,250 mg 166.7 mL/hr over 90 Minutes Intravenous Every  24 hours 12/06/23 1938 12/09/23 0900   12/06/23 2030  vancomycin (VANCOREADY) IVPB 1500 mg/300 mL       Placed in "Followed by" Linked Group   1,500 mg 150 mL/hr over 120 Minutes Intravenous  Once 12/06/23 1938 12/07/23 0143   12/06/23 2015  ceFEPIme (MAXIPIME) 1 g in sodium chloride 0.9 % 100 mL IVPB  Status:  Discontinued        1 g 200 mL/hr over 30 Minutes Intravenous Every 8 hours 12/06/23 1919 12/09/23 1030         Objective: Vitals:   12/09/23 1544 12/09/23 2114 12/10/23 0457 12/10/23 0736  BP: 126/64 (!) 155/74 (!) 145/81 133/67  Pulse: 79 70 90 84  Resp: 15 20 18 18   Temp: 98.9 F (37.2 C) 97.9 F (36.6 C) 98.2 F (36.8 C) 98.3 F (36.8 C)  TempSrc: Oral  Oral Oral  SpO2: 100% 100% 100% 100%  Weight:      Height:        Intake/Output Summary (Last 24 hours) at 12/10/2023 1303 Last data filed at 12/09/2023 2229 Gross per 24 hour  Intake 508.08 ml  Output --  Net 508.08 ml   Filed Weights   12/07/23 0611  Weight: 64.7 kg    Examination:  General exam: Appears calm and comfortable  Respiratory system:  Respiratory effort normal. No respiratory distress. No conversational dyspnea.  On 2 L oxygen Central nervous system: Alert  Extremities: Symmetric in appearance  Skin: No rashes, lesions or ulcers on exposed skin   Data Reviewed: I have personally reviewed following labs and imaging studies  CBC: Recent Labs  Lab 12/06/23 0748 12/07/23 0637 12/08/23 0616 12/09/23 0403 12/10/23 0244  WBC 24.8* 29.5* 25.5* 17.4* 15.4*  HGB 9.7* 9.1* 8.2* 7.5* 7.5*  HCT 31.2* 28.4* 25.7* 23.8* 24.3*  MCV 90.2 87.4 87.4 88.8 90.3  PLT 256 281 282 251 235   Basic Metabolic Panel: Recent Labs  Lab 12/06/23 0748 12/07/23 0637 12/08/23 0616 12/09/23 0403 12/10/23 0244  NA 142 140 138 141 138  K 3.5 3.5 3.2* 3.6 3.4*  CL 97* 99 99 102 101  CO2 34* 29 28 30 29   GLUCOSE 91 123* 119* 110* 127*  BUN 9 15 17 18 18   CREATININE 0.83 0.86 0.98 1.00 0.85  CALCIUM 8.6* 7.7* 7.7* 7.9* 8.1*  MG  --  1.4* 2.1 2.0 1.8   GFR: Estimated Creatinine Clearance: 61 mL/min (by C-G formula based on SCr of 0.85 mg/dL). Liver Function Tests: Recent Labs  Lab 12/07/23 0637  AST 12*  ALT 8  ALKPHOS 111  BILITOT 0.8  PROT 5.2*  ALBUMIN 2.1*   No results for input(s): "LIPASE", "AMYLASE" in the last 168 hours. No results for input(s): "AMMONIA" in the last 168 hours. Coagulation Profile: No  results for input(s): "INR", "PROTIME" in the last 168 hours. Cardiac Enzymes: No results for input(s): "CKTOTAL", "CKMB", "CKMBINDEX", "TROPONINI" in the last 168 hours. BNP (last 3 results) No results for input(s): "PROBNP" in the last 8760 hours. HbA1C: No results for input(s): "HGBA1C" in the last 72 hours. CBG: No results for input(s): "GLUCAP" in the last 168 hours. Lipid Profile: No results for input(s): "CHOL", "HDL", "LDLCALC", "TRIG", "CHOLHDL", "LDLDIRECT" in the last 72 hours. Thyroid Function Tests: No results for input(s): "TSH", "T4TOTAL", "FREET4", "T3FREE", "THYROIDAB" in the last 72 hours. Anemia Panel: No results for input(s): "VITAMINB12", "FOLATE", "FERRITIN", "TIBC", "IRON", "RETICCTPCT" in the last 72 hours. Sepsis Labs: Recent Labs  Lab 12/06/23  1660 12/06/23 2108 12/07/23 0004 12/07/23 0637  PROCALCITON 0.88  --   --   --   LATICACIDVEN  --  4.9* 2.4* 1.2    Recent Results (from the past 240 hours)  Resp panel by RT-PCR (RSV, Flu A&B, Covid) Anterior Nasal Swab     Status: None   Collection Time: 12/06/23  9:15 AM   Specimen: Anterior Nasal Swab  Result Value Ref Range Status   SARS Coronavirus 2 by RT PCR NEGATIVE NEGATIVE Final   Influenza A by PCR NEGATIVE NEGATIVE Final   Influenza B by PCR NEGATIVE NEGATIVE Final    Comment: (NOTE) The Xpert Xpress SARS-CoV-2/FLU/RSV plus assay is intended as an aid in the diagnosis of influenza from Nasopharyngeal swab specimens and should not be used as a sole basis for treatment. Nasal washings and aspirates are unacceptable for Xpert Xpress SARS-CoV-2/FLU/RSV testing.  Fact Sheet for Patients: BloggerCourse.com  Fact Sheet for Healthcare Providers: SeriousBroker.it  This test is not yet approved or cleared by the [place] FDA and has been authorized for detection and/or diagnosis of SARS-CoV-2 by FDA under an Emergency Use Authorization (EUA). This  EUA will remain in effect (meaning this test can be used) for the duration of the COVID-19 declaration under Section 564(b)(1) of the Act, 21 U.S.C. section 360bbb-3(b)(1), unless the authorization is terminated or revoked.     Resp Syncytial Virus by PCR NEGATIVE NEGATIVE Final    Comment: (NOTE) Fact Sheet for Patients: BloggerCourse.com  Fact Sheet for Healthcare Providers: SeriousBroker.it  This test is not yet approved or cleared by the [place] FDA and has been authorized for detection and/or diagnosis of SARS-CoV-2 by FDA under an Emergency Use Authorization (EUA). This EUA will remain in effect (meaning this test can be used) for the duration of the COVID-19 declaration under Section 564(b)(1) of the Act, 21 U.S.C. section 360bbb-3(b)(1), unless the authorization is terminated or revoked.  Performed at [facility], [address] [place], [place] [address]   MRSA Next Gen by PCR, Nasal     Status: Abnormal   Collection Time: 12/06/23  7:32 PM   Specimen: Nasal Mucosa; Nasal Swab  Result Value Ref Range Status   MRSA by PCR Next Gen DETECTED (A) NOT DETECTED Final    Comment: RESULT CALLED TO, READ BACK BY AND VERIFIED WITH: M SHRESTHA 12/07/2023 @ 0517 BY AB (NOTE) The GeneXpert MRSA Assay (FDA approved for NASAL specimens only), is one component of a comprehensive MRSA colonization surveillance program. It is not intended to diagnose MRSA infection nor to guide or monitor treatment for MRSA infections. Test performance is not FDA approved in patients less than 54 years old. Performed at [facility], [address] [place], [place] [address]   Culture, blood (single) w Reflex to ID Panel     Status: None (Preliminary result)   Collection Time: 12/06/23  9:09 PM   Specimen: BLOOD  Result Value Ref Range Status   Specimen Description BLOOD SITE NOT SPECIFIED  Final   Special Requests   Final     BOTTLES DRAWN AEROBIC AND ANAEROBIC Blood Culture results may not be optimal due to an inadequate volume of blood received in culture bottles   Culture   Final    NO GROWTH 4 DAYS Performed at [facility], [address] [place], [place] [address]    Report Status PENDING  Incomplete      Radiology Studies: No results found.  Scheduled Meds:  arformoterol  15 mcg Nebulization BID   aspirin  81 mg Oral Daily   atorvastatin  20 mg Oral Daily   budesonide (PULMICORT) nebulizer solution  0.5 mg Nebulization BID   Chlorhexidine Gluconate Cloth  6 each Topical Daily   enoxaparin (LOVENOX) injection  40 mg Subcutaneous Q24H   feeding supplement  237 mL Oral BID BM   guaiFENesin  600 mg Oral BID   levETIRAcetam  750 mg Oral Daily   metoprolol tartrate  25 mg Oral Daily   montelukast  10 mg Oral QHS   mupirocin ointment   Nasal BID   pantoprazole  40 mg Oral Daily   predniSONE  30 mg Oral Q breakfast   sertraline  50 mg Oral Daily   sodium chloride flush  10-40 mL Intracatheter Q12H   sodium chloride flush  3 mL Intravenous Q12H   Continuous Infusions:  ceFEPime (MAXIPIME) IV 2 g (12/10/23 0457)   vancomycin 1,000 mg (12/09/23 1742)     LOS: 3 days   Time spent: 25 minutes   Noralee Stain, DO [facility] 12/10/2023, 1:03 PM   Available via Epic secure chat 7am-7pm After these hours, please refer to coverage provider listed on [email or web address]

## 2023-12-10 NOTE — Progress Notes (Signed)
This chaplain responded to PMT NP-Julia consult for creating the Pt. HCPOA in the hospital.   The Pt. requested time to continue her nap today with a chaplain revisit on Tuesday.  The chaplain left the Pt. AD education on the Pt. bedside table.  Chaplain Stephanie Acre [phone]

## 2023-12-10 NOTE — Evaluation (Signed)
Occupational Therapy Evaluation Patient Details Name: [name] MRN: [id number] DOB: June 11, 1961 Today's Date: 12/10/2023   History of Present Illness 63 y/o female presenting to ED on 1/23 with chief complaints of SOB, cough, and chest pain. Chest xray with persistent R upper lung air space opacity associated with cavitary lesion. Admitted with severe sepsis due to HCAP. 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  Admitted for above and presents with problem list below.  She requires min to min guard assist for transfers, min guard for mobility using RW, and up to min guard assist for ADLs.  Pt oriented to self and place, but not time.  Poor recall and problem solving during session, requiring verbal cueing.  Pt will benefit from further cognitive assessment, but do recommend 24/7 supervision for safety and assist with medication/meals at dc.  Would recommend HHOT services, if able, to optimize independence and safety. Will follow acutely.       If plan is discharge home, recommend the following: A little help with walking and/or transfers;A little help with bathing/dressing/bathroom;Assistance with cooking/housework;Help with stairs or ramp for entrance;Assist for transportation;Direct supervision/assist for medications management;Direct supervision/assist for financial management    Functional Status Assessment  Patient has had a recent decline in their functional status and demonstrates the ability to make significant improvements in function in a reasonable and predictable amount of time.  Equipment Recommendations  BSC/3in1    Recommendations for Other Services       Precautions / Restrictions Precautions Precautions: Fall Precaution Comments: 2L  baseline Restrictions Weight Bearing Restrictions Per Provider Order: No      Mobility Bed Mobility Overal bed mobility: Needs Assistance Bed Mobility: Supine to Sit     Supine to sit: Contact guard     General bed mobility comments: no assist required, min guard for safety and lines    Transfers Overall transfer level: Needs assistance Equipment used: Rolling walker (2 wheels) Transfers: Sit to/from Stand Sit to Stand: Contact guard assist, Min assist           General transfer comment: min guard from EOB but min assist from low commode and couch; cueing for hand placement and technique      Balance Overall balance assessment: Needs assistance Sitting-balance support: No upper extremity supported, Feet supported Sitting balance-Leahy Scale: Good     Standing balance support: Bilateral upper extremity supported, No upper extremity supported, During functional activity Standing balance-Leahy Scale: Fair Standing balance comment: relies on RW dynamically                           ADL either performed or assessed with clinical judgement   ADL Overall ADL's : Needs assistance/impaired     Grooming: Contact guard assist;Standing;Wash/dry hands           Upper Body Dressing : Sitting;Minimal assistance   Lower Body Dressing: Contact guard assist;Sit to/from stand Lower Body Dressing Details (indicate cue type and reason): able to don socks, min guard in standing Toilet Transfer: Minimal assistance;Ambulation Toilet Transfer Details (indicate cue type and reason): RW, grabbar. min assist to boost Toileting- Clothing Manipulation and Hygiene: Contact guard assist;Sit to/from stand       Functional mobility during ADLs: Contact guard  assist;Rolling walker (2 wheels)       Vision   Vision Assessment?: No apparent visual deficits     Perception         Praxis         Pertinent Vitals/Pain Pain Assessment Pain Assessment: No/denies pain      Extremity/Trunk Assessment Upper Extremity Assessment Upper Extremity Assessment: Right hand dominant;Generalized weakness   Lower Extremity Assessment Lower Extremity Assessment: Defer to PT evaluation       Communication Communication Communication: No apparent difficulties   Cognition Arousal: Alert Behavior During Therapy: Flat affect Overall Cognitive Status: No family/caregiver present to determine baseline cognitive functioning                                 General Comments: pt oriented to person, not to time.  Follows simple commands but demonstrates decreased recall and probelm solving.  Questionable historian.     General Comments  VSS on 2L, O2 maintained >92%    Exercises     Shoulder Instructions      Home Living Family/patient expects to be discharged to:: Private residence Living Arrangements: Children Available Help at Discharge: Family Type of Home: Other(Comment) (condo) Home Access: Stairs to enter Secretary/administrator of Steps: 5 Entrance Stairs-Rails: Can reach both Home Layout: One level     Bathroom Shower/Tub: Chief Strategy Officer: Standard     Home Equipment: Agricultural consultant (2 wheels);Rollator (4 wheels)   Additional Comments: Oxygen at home, reports alone during day and son just started a new job but can see if her daughter can assist      Prior Functioning/Environment Prior Level of Function : Independent/Modified Independent;Patient poor historian/Family not available             Mobility Comments: PRN use of w/c and rollator ADLs Comments: Independent, ADLs and light IADLs        OT Problem List: Decreased strength;Impaired balance (sitting and/or standing);Decreased activity tolerance;Decreased cognition;Decreased safety awareness;Decreased knowledge of use of DME or AE;Decreased knowledge of precautions;Cardiopulmonary status limiting activity      OT Treatment/Interventions:  Self-care/ADL training;Therapeutic exercise;DME and/or AE instruction;Therapeutic activities;Cognitive remediation/compensation;Balance training;Patient/family education    OT Goals(Current goals can be found in the care plan section) Acute Rehab OT Goals Patient Stated Goal: home OT Goal Formulation: With patient Time For Goal Achievement: 12/24/23 Potential to Achieve Goals: Good  OT Frequency: Min 1X/week    Co-evaluation PT/OT/SLP Co-Evaluation/Treatment: Yes Reason for Co-Treatment: For patient/therapist safety;To address functional/ADL transfers;Other (comment) (activity tolerance)   OT goals addressed during session: ADL's and self-care      AM-PAC OT "6 Clicks" Daily Activity     Outcome Measure Help from another person eating meals?: A Little Help from another person taking care of personal grooming?: A Little Help from another person toileting, which includes using toliet, bedpan, or urinal?: A Little Help from another person bathing (including washing, rinsing, drying)?: A Little Help from another person to put on and taking off regular upper body clothing?: A Little Help from another person to put on and taking off regular lower body clothing?: A Little 6 Click Score: 18   End of Session Equipment Utilized During Treatment: Rolling walker (2 wheels);Oxygen Nurse Communication: Mobility status  Activity Tolerance: Patient tolerated treatment well Patient left: in chair;with call bell/phone within reach;with chair alarm set  OT Visit Diagnosis: Unsteadiness on feet (R26.81);Muscle weakness (generalized) (M62.81);Other symptoms and  signs involving cognitive function                Time: 1610-9604 OT Time Calculation (min): 31 min Charges:  OT General Charges $OT Visit: 1 Visit OT Evaluation $OT Eval Moderate Complexity: 1 Mod  Barry Brunner, OT Acute Rehabilitation Services Office [phone]   Chancy Milroy 12/10/2023, 9:06 AM

## 2023-12-11 DIAGNOSIS — R051 Acute cough: Secondary | ICD-10-CM | POA: Diagnosis not present

## 2023-12-11 DIAGNOSIS — J189 Pneumonia, unspecified organism: Secondary | ICD-10-CM | POA: Diagnosis not present

## 2023-12-11 DIAGNOSIS — Z515 Encounter for palliative care: Secondary | ICD-10-CM | POA: Diagnosis not present

## 2023-12-11 DIAGNOSIS — J441 Chronic obstructive pulmonary disease with (acute) exacerbation: Secondary | ICD-10-CM | POA: Diagnosis not present

## 2023-12-11 LAB — CULTURE, BLOOD (SINGLE): Culture: NO GROWTH

## 2023-12-11 LAB — CBC
HCT: 23.7 % — ABNORMAL LOW (ref 36.0–46.0)
Hemoglobin: 7.4 g/dL — ABNORMAL LOW (ref 12.0–15.0)
MCH: 27.7 pg (ref 26.0–34.0)
MCHC: 31.2 g/dL (ref 30.0–36.0)
MCV: 88.8 fL (ref 80.0–100.0)
Platelets: 212 10*3/uL (ref 150–400)
RBC: 2.67 MIL/uL — ABNORMAL LOW (ref 3.87–5.11)
RDW: 15.1 % (ref 11.5–15.5)
WBC: 15.9 10*3/uL — ABNORMAL HIGH (ref 4.0–10.5)
nRBC: 0 % (ref 0.0–0.2)

## 2023-12-11 LAB — BASIC METABOLIC PANEL
Anion gap: 7 (ref 5–15)
BUN: 16 mg/dL (ref 8–23)
CO2: 31 mmol/L (ref 22–32)
Calcium: 8.5 mg/dL — ABNORMAL LOW (ref 8.9–10.3)
Chloride: 105 mmol/L (ref 98–111)
Creatinine, Ser: 0.78 mg/dL (ref 0.44–1.00)
GFR, Estimated: >60 mL/min (ref >60)
Glucose, Bld: 90 mg/dL (ref 70–99)
Potassium: 3.8 mmol/L (ref 3.5–5.1)
Sodium: 143 mmol/L (ref 135–145)

## 2023-12-11 NOTE — Plan of Care (Signed)
Problem: Education: Goal: Knowledge of General Education information will improve Description: Including pain rating scale, medication(s)/side effects and non-pharmacologic comfort measures Outcome: Progressing   Problem: Nutrition: Goal: Adequate nutrition will be maintained Outcome: Progressing   Problem: Coping: Goal: Level of anxiety will decrease Outcome: Progressing

## 2023-12-11 NOTE — Progress Notes (Signed)
Occupational Therapy Treatment Patient Details Name: [name] MRN: [id number] DOB: 18-Jan-1961 Today's Date: 12/11/2023   History of present illness 63 y/o female presenting to ED on 1/23 with chief complaints of SOB, cough, and chest pain. Chest xray with persistent R upper lung air space opacity associated with cavitary lesion. Admitted with severe sepsis due to HCAP. 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  Continues to require min guard for transfers and short distance mobility using RW.  Cueing for safety.  Scored 20/28 (significant impairment) on short blessed test, with difficulties with recall, attention and sequencing.  She continues to report she will have family support 24/7 at home. Recommend no driving, and assist with meds, meals, and fiances at dc. Will follow acutely with recommendations for home health at dc.       If plan is discharge home, recommend the following:  A little help with walking and/or transfers;A little help with bathing/dressing/bathroom;Assistance with cooking/housework;Help with stairs or ramp for entrance;Assist for transportation;Direct supervision/assist for medications management;Direct supervision/assist for financial management   Equipment Recommendations  BSC/3in1    Recommendations for Other Services      Precautions / Restrictions Precautions Precautions: Fall Precaution Comments: 2L baseline, Port Restrictions Weight Bearing Restrictions Per Provider Order: No       Mobility Bed Mobility Overal bed mobility: Needs Assistance Bed Mobility: Supine to Sit, Sit to Supine     Supine to sit: Contact guard Sit to supine: Mod assist   General bed mobility comments: requires assist for BLEs back to bed    Transfers Overall transfer level: Needs  assistance Equipment used: Rolling walker (2 wheels) Transfers: Sit to/from Stand Sit to Stand: Contact guard assist           General transfer comment: min guard from EOB, increased time and cueing for hand placement     Balance Overall balance assessment: Needs assistance Sitting-balance support: No upper extremity supported, Feet supported Sitting balance-Leahy Scale: Good     Standing balance support: Bilateral upper extremity supported, No upper extremity supported, During functional activity Standing balance-Leahy Scale: Fair Standing balance comment: relies on RW dynamically                           ADL either performed or assessed with clinical judgement   ADL Overall ADL's : Needs assistance/impaired     Grooming: Set up;Sitting               Lower Body Dressing: Contact guard assist;Sit to/from stand   Toilet Transfer: Contact guard assist;Ambulation;BSC/3in1;Rolling walker (2 wheels)   Toileting- Clothing Manipulation and Hygiene: Contact guard assist;Sit to/from stand       Functional mobility during ADLs: Contact guard assist;Rolling walker (2 wheels)      Extremity/Trunk Assessment Upper Extremity Assessment Upper Extremity Assessment: Right hand dominant;Generalized weakness            Vision       Perception     Praxis      Cognition Arousal: Alert Behavior During Therapy: Flat affect Overall Cognitive Status: No family/caregiver present to determine baseline cognitive functioning  General Comments: pt scored 20/28 (significant impairments) on short blessed test demonstrating difficulty with attention, sequencing and recall.  Reports it is 2024.        Exercises      Shoulder Instructions       General Comments on 2L O2    Pertinent Vitals/ Pain       Pain Assessment Pain Assessment: Faces Faces Pain Scale: No hurt Pain Intervention(s): Monitored during  session  Home Living                                          Prior Functioning/Environment              Frequency  Min 1X/week        Progress Toward Goals  OT Goals(current goals can now be found in the care plan section)  Progress towards OT goals: Progressing toward goals  Acute Rehab OT Goals Patient Stated Goal: home OT Goal Formulation: With patient Time For Goal Achievement: 12/24/23 Potential to Achieve Goals: Good  Plan      Co-evaluation                 AM-PAC OT "6 Clicks" Daily Activity     Outcome Measure   Help from another person eating meals?: A Little Help from another person taking care of personal grooming?: A Little Help from another person toileting, which includes using toliet, bedpan, or urinal?: A Little Help from another person bathing (including washing, rinsing, drying)?: A Little Help from another person to put on and taking off regular upper body clothing?: A Little Help from another person to put on and taking off regular lower body clothing?: A Little 6 Click Score: 18    End of Session Equipment Utilized During Treatment: Rolling walker (2 wheels);Oxygen  OT Visit Diagnosis: Unsteadiness on feet (R26.81);Muscle weakness (generalized) (M62.81);Other symptoms and signs involving cognitive function   Activity Tolerance Patient tolerated treatment well   Patient Left in bed;with bed alarm set   Nurse Communication Mobility status        Time: 1205-1222 OT Time Calculation (min): 17 min  Charges: OT General Charges $OT Visit: 1 Visit OT Treatments $Self Care/Home Management : 8-22 mins  Barry Brunner, OT Acute Rehabilitation Services Office [phone]   Chancy Milroy 12/11/2023, 1:40 PM

## 2023-12-11 NOTE — Progress Notes (Signed)
This chaplain is following up with Monday's attempt for Advance Directive education. The Pt. is awake and participates in AD education with the chaplain. The Pt. answers the chaplain's clarifying questions.  The Pt. is naming [name] as her healthcare agent. If this person is unable or unwilling to serve in this role, the Pt. second choice is [name]. The Pt. is not completing a Living Will.  The chaplain is present with the Pt., notary and witnesses for the notarizing of the Pt. HCPOA only.   The chaplain gave the Pt. the original AD along with two copies. The chaplain scanned the Pt. AD into the Pt. EMR.  The chaplain is available for F/U spiritual care as needed.  Chaplain Stephanie Acre [phone]

## 2023-12-11 NOTE — Progress Notes (Signed)
Physical Therapy Treatment Patient Details Name: [name] MRN: [id number] DOB: Dec 27, 1960 Today's Date: 12/11/2023   History of Present Illness 63 y/o female presenting to ED on 1/23 with chief complaints of SOB, cough, and chest pain. Chest xray with persistent R upper lung air space opacity associated with cavitary lesion. Admitted with severe sepsis due to HCAP. 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 Pt was able to demonstrate ability to transfer on and off bedside commode, perform peri care and return to bed without physical assist. Deferred further ambulation. Will continue to follow acutely.   If plan is discharge home, recommend the following: A little help with walking and/or transfers;Assist for transportation;Help with stairs or ramp for entrance;A little help with bathing/dressing/bathroom;Direct supervision/assist for medications management;Direct supervision/assist for financial management   Can travel by private vehicle        Equipment Recommendations  None recommended by PT    Recommendations for Other Services       Precautions / Restrictions Precautions Precautions: Fall Precaution Comments: 2L baseline, Port Restrictions Weight Bearing Restrictions Per Provider Order: No     Mobility  Bed Mobility Overal bed mobility: Needs Assistance Bed Mobility: Sit to Supine       Sit to supine: Supervision   General bed mobility comments: Increased time to elevate BLE's back into bed    Transfers Overall transfer level: Needs assistance Equipment used: Rolling walker (2 wheels) Transfers: Sit to/from Stand, Bed to chair/wheelchair/BSC Sit to Stand: Supervision Stand pivot transfers: Contact guard assist         General transfer comment: No physical assist to stand from [facility]  and pivot to bed    Ambulation/Gait                   Stairs             Wheelchair Mobility     Tilt Bed    Modified Rankin (Stroke Patients Only)       Balance Overall balance assessment: Needs assistance Sitting-balance support: No upper extremity supported, Feet supported Sitting balance-Leahy Scale: Good     Standing balance support: Bilateral upper extremity supported, No upper extremity supported, During functional activity Standing balance-Leahy Scale: Fair Standing balance comment: relies on RW dynamically                            Cognition Arousal: Alert Behavior During Therapy: Flat affect Overall Cognitive Status: No family/caregiver present to determine baseline cognitive functioning                                 General Comments: pt scored 20/28 (significant impairments) on short blessed test demonstrating difficulty with attention, sequencing and recall.  Reports it is 2024.        Exercises      General Comments General comments (skin integrity, edema, etc.): on 2L O2      Pertinent Vitals/Pain Pain Assessment Pain Assessment: Faces Faces Pain Scale: No hurt    Home Living                          Prior Function  PT Goals (current goals can now be found in the care plan section) Acute Rehab PT Goals Potential to Achieve Goals: Fair    Frequency    Min 1X/week      PT Plan      Co-evaluation              AM-PAC PT "6 Clicks" Mobility   Outcome Measure  Help needed turning from your back to your side while in a flat bed without using bedrails?: None Help needed moving from lying on your back to sitting on the side of a flat bed without using bedrails?: A Little Help needed moving to and from a bed to a chair (including a wheelchair)?: A Little Help needed standing up from a chair using your arms (e.g., wheelchair or bedside chair)?: A Little Help needed  to walk in hospital room?: A Little Help needed climbing 3-5 steps with a railing? : A Lot 6 Click Score: 18    End of Session Equipment Utilized During Treatment: Gait belt;Oxygen Activity Tolerance: Patient limited by fatigue Patient left: with call bell/phone within reach;in bed;with bed alarm set   PT Visit Diagnosis: Muscle weakness (generalized) (M62.81);Difficulty in walking, not elsewhere classified (R26.2)     Time: 6045-4098 PT Time Calculation (min) (ACUTE ONLY): 11 min  Charges:    $Therapeutic Activity: 8-22 mins PT General Charges $$ ACUTE PT VISIT: 1 Visit                     Lillia Pauls, PT, DPT Acute Rehabilitation Services Office [phone]    Norval Morton 12/11/2023, 4:32 PM

## 2023-12-11 NOTE — Progress Notes (Signed)
Daily Progress Note   Patient Name: [name]       Date: 12/11/2023 DOB: April 21, 1961  Age: 63 y.o. MRN#: [id number] Attending Physician: Noralee Stain, DO Primary Care Physician: [name]., FNP Admit Date: 12/06/2023  Reason for Consultation/Follow-up: Establishing goals of care  Subjective: I have reviewed medical records including EPIC notes, MAR, any available advanced directives as necessary, and labs. Received report from primary RN - no acute concerns.  Per RN patient remains with poor appetite and minimal intake.  Went to visit patient at bedside - no family/visitors present.  Patient was lying in bed awake, alert, oriented, and able to participate in conversation. No signs or non-verbal gestures of pain or discomfort noted. No respiratory distress, increased work of breathing, or secretions noted.  She is on 2 L O2 nasal cannula.  Emotional support provided.  Reviewed PT/OT recommendations for home with home health.  In light of evaluation and deemed safe to return home, reviewed options of discharge home with home health versus reenrollment in home hospice services.  Patient prefers for discharge home and reenrollment in hospice.  All questions and concerns addressed. Encouraged to call with questions and/or concerns. PMT card previously provided.  1:32 PM Attempted to call daughter/[name] to provide discharge updates - no answer - confidential voicemail left and PMT phone number provided with request to return call if updates are desired.  Per discussion yesterday she is supportive of patient's decisions.   Length of Stay: 4  Current Medications: Scheduled Meds:   arformoterol  15 mcg Nebulization BID   aspirin  81 mg Oral Daily   atorvastatin  20 mg Oral Daily    budesonide (PULMICORT) nebulizer solution  0.5 mg Nebulization BID   Chlorhexidine Gluconate Cloth  6 each Topical Daily   enoxaparin (LOVENOX) injection  40 mg Subcutaneous Q24H   feeding supplement  237 mL Oral BID BM   guaiFENesin  600 mg Oral BID   levETIRAcetam  750 mg Oral Daily   metoprolol tartrate  25 mg Oral Daily   montelukast  10 mg Oral QHS   mupirocin ointment   Nasal BID   pantoprazole  40 mg Oral Daily   predniSONE  30 mg Oral Q breakfast   sertraline  50 mg Oral Daily   sodium chloride flush  10-40 mL Intracatheter Q12H   sodium chloride flush  3 mL Intravenous Q12H    Continuous Infusions:  ceFEPime (MAXIPIME) IV 2 g (12/11/23 0523)   vancomycin 1,000 mg (12/10/23 1826)    PRN Meds: acetaminophen **OR** acetaminophen, albuterol, HYDROcodone-acetaminophen, sodium chloride flush  Physical Exam Vitals and nursing note reviewed.  Constitutional:      General: She is not in acute distress. Pulmonary:     Effort: No respiratory distress.  Skin:    General: Skin is warm and dry.  Neurological:     Mental Status: She is alert and oriented to person, place, and time.  Psychiatric:        Attention and Perception: Attention normal.        Behavior: Behavior is cooperative.        Cognition and Memory: Cognition and memory normal.             Vital Signs: BP 136/75 (BP Location: Right Arm)   Pulse 84   Temp 98.5 F (36.9 C) (Oral)   Resp 19   Ht 5\' 5"  (1.651 m)   Wt 64.7 kg   SpO2 96%   BMI 23.74 kg/m  SpO2: SpO2: 96 % O2 Device: O2 Device: Nasal Cannula O2 Flow Rate: O2 Flow Rate (L/min): 2 L/min  Intake/output summary:  Intake/Output Summary (Last 24 hours) at 12/11/2023 1320 Last data filed at 12/10/2023 1621 Gross per 24 hour  Intake 191.99 ml  Output 1 ml  Net 190.99 ml   LBM: Last BM Date : 12/11/23 Baseline Weight: Weight: 64.7 kg Most recent weight: Weight: 64.7 kg       Palliative Assessment/Data: PPS 60-70%      Patient Active  Problem List   Diagnosis Date Noted   FTT (failure to thrive) in adult 11/14/2023   QT prolongation 11/14/2023   Generalized body aches 11/13/2023   HCAP (healthcare-associated pneumonia) 11/03/2023   Generalized weakness 10/13/2023   Thrombocytopenia (HCC) 10/13/2023   Malignant neoplasm of right lung (HCC) 10/13/2023   Sepsis (HCC) 10/13/2023   Fever and neutropenia (HCC) 10/12/2023   Goals of care, counseling/discussion 09/27/2023   Acute metabolic encephalopathy 09/17/2023   Cerebral edema (HCC) 08/31/2023   Acute metabolic encephalopathy 08/28/2023   Community acquired pneumonia of right upper lobe of lung 08/28/2023   AKI (acute kidney injury) (HCC) 08/27/2023   Lung cancer metastatic to brain (HCC) 08/08/2023   Port-A-Cath in place 08/07/2023   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) 07/24/2023   Squamous cell lung cancer, 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   Frontal mass of brain 06/26/2023   Mass of upper lobe of right lung 06/26/2023   DNR (do not resuscitate) 06/26/2023   Cirrhosis (HCC) 03/14/2022   Benign neoplasm of ascending colon    Anemia, chronic disease 01/10/2019   Chronic cough 11/26/2017   Depression    Obesity (BMI 30-39.9) 01/10/2017   Dyspnea on exertion 11/09/2016   Chronic respiratory failure with hypoxia and hypercapnia (HCC) 10/29/2016   Depression with anxiety 08/07/2016   Hot flashes 02/01/2016   Hypokalemia    Breast cancer of upper-outer quadrant of right female breast (HCC) 08/04/2015   GERD (gastroesophageal reflux disease) 12/21/2012   Hyperlipidemia 12/21/2012   Solitary pulmonary nodule on lung CT 12/21/2012   SICKLE-CELL TRAIT 04/15/2008   Sarcoidosis (HCC) 01/10/2007   Hypertension 01/10/2007   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 female  with past medical history of stage lung cancer with mets to  brain and liver, COPD, chronic respiratory failure on 2 L oxygen at baseline, hypertension, anxiety, and depression who presented to the ED on 12/06/2023 with increasing shortness of breath and cough.  Chest x-ray showed persistent right upper lung air space opacity associated with cavitary lesion.  She is admitted with HCAP.   Of note, patient had previously undergone chemotherapy for her lung cancer, but it was discontinued due to severe side effects that led to hospitalization.  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 If there are any imminent needs please call the service directly  Goals of Care and Additional Recommendations: Limitations on Scope of Treatment: No Artificial Feeding and No Tracheostomy  Code Status:    Code Status Orders  (From admission, onward)           Start     Ordered   12/06/23 1548  Do not attempt resuscitation (DNR)- Limited -Do Not Intubate (DNI)  Continuous       Question Answer Comment  If pulseless and not breathing No CPR or chest compressions.   In Pre-Arrest Conditions (Patient Is Breathing and Has A Pulse) Do not intubate. Provide all appropriate non-invasive medical interventions. Avoid ICU transfer unless indicated or required.   Consent: Discussion documented in EHR or advanced directives reviewed      12/06/23 1551           Code Status History     Date Active Date Inactive Code Status Order  ID Comments User Context   11/13/2023 2222 11/15/2023 2354 Limited: Do not attempt resuscitation (DNR) -DNR-LIMITED -Do Not Intubate/DNI  [id number]  Darlin Drop, DO ED   11/03/2023 1007 11/09/2023 1927 Do not attempt resuscitation (DNR) PRE-ARREST INTERVENTIONS DESIRED [id number]  Maryln Gottron, MD ED   10/12/2023 1438 10/19/2023 1744 Do not attempt resuscitation (DNR) PRE-ARREST INTERVENTIONS DESIRED [id number]  Steffanie Rainwater, MD ED   09/17/2023 1756 09/21/2023 2209 Limited: Do not attempt resuscitation (DNR) -DNR-LIMITED -Do Not Intubate/DNI  [id number]  Alessandra Bevels, MD ED   08/27/2023 1653 09/01/2023 1836 Limited: Do not attempt resuscitation (DNR) -DNR-LIMITED -Do Not Intubate/DNI  [id number]  Maryln Gottron, MD ED   08/01/2023 1159 08/02/2023 0507 Full Code [id number]  Gilmer Mor, DO HOV   06/26/2023 2136 06/28/2023 1638 DNR [id number]  Carollee Herter, DO ED   06/26/2023 2102 06/26/2023 2136 DNR [id number]  Carollee Herter, DO ED   02/19/2019 2253 02/21/2019 1603 Full Code [id number]  Eduard Clos, MD ED   12/17/2018 1806 12/22/2018 2111 Full Code [id number]  Lorin Glass, MD Inpatient   09/08/2017 0012 09/09/2017 1821 Full Code [id number]  Lovena Neighbours, MD Inpatient   10/06/2016 2241 10/10/2016 1928 Full Code [id number]  Michael Litter, MD Inpatient   10/08/2015 1814 10/11/2015 1714 Full Code [id number]  Bonney Aid, MD Inpatient      Advance Directive Documentation    Flowsheet  Row Most Recent Value  Type of Advance Directive Out of facility DNR (pink MOST or yellow form)  Pre-existing out of facility DNR order (yellow form or pink MOST form) --  "MOST" Form in Place? --       Prognosis:  < 6 months  Discharge Planning: Home with Hospice  Care plan was discussed with primary RN, patient, Dr. Alvino Chapel, [facility]  Thank you for allowing the Palliative Medicine Team to assist in the care of this patient.   Total Time 35 minutes Prolonged Time Billed  no       Haskel Khan,  NP  Please contact Palliative Medicine Team phone at [phone] for questions and concerns.   *Portions of this note are a verbal dictation therefore any spelling and/or grammatical errors are due to the "[employer]agon [employer] One" system interpretation.

## 2023-12-11 NOTE — Care Management Important Message (Signed)
Important Message  Patient Details  Name: [name] MRN: [id number] Date of Birth: 1961/10/05   Important Message Given:  Yes - Medicare IM     Dorena Bodo 12/11/2023, 4:35 PM

## 2023-12-11 NOTE — Progress Notes (Addendum)
PROGRESS NOTE    [name]  RUE:[id number] DOB: 1961/01/08 DOA: 12/06/2023 PCP: [name]., FNP     Brief Narrative:  [name] is a 63 y.o. 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 The patient also reported a recent change in her respiratory status, describing it as "funny," and a worsening cough. Occasionally, the cough has been productive and blood has been present.  Other associated symptoms include chills.   The patient had previously undergone chemotherapy for her lung cancer, but it  had to discontinue due to severe side effects that led to hospitalization. She had been scheduled for another round of chemotherapy but was unable to attend due to her current illness.   In addition to her lung cancer, the patient was recently placed under hospice care at home 6 days ago. However, she reported feeling unable to care for herself at home at present, leading to her request for hospital admission. The patient also has a Do Not Resuscitate (DNR) order in place.  She does want to treatment for any signs of infection.  New events last 24 hours / Subjective: States that she is feeling very tired today.  PT OT recommendations are for home health.  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    Chronic respiratory failure with hypoxia and hypercapnia (HCC)   Lung cancer metastatic to brain (HCC)   Anemia, chronic disease   Depression with anxiety   Hyperlipidemia   Generalized weakness   Severe sepsis secondary to HCAP -Sepsis present on admission with leukocytosis, tachycardia,  lactic acidosis 4.9 -Chest x-ray showed persistent right upper lung air space opacity associated with cavitary lesion -Vancomycin, cefepime x 7 days  -WBC improved  COPD with chronic hypoxic respiratory failure -Uses 2 L nasal cannula O2 at baseline -Continue prednisone - wean -Continue breathing treatments  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, with mets to brain  -Patient was enrolled in home hospice 1/17 -[facility] -Palliative care consulted  Hyperlipidemia -Lipitor  Depression and anxiety -Zoloft     DVT prophylaxis:  enoxaparin (LOVENOX) injection 40 mg Start: 12/06/23 2000  Code Status: DNR Family Communication: No family at bedside today Disposition Plan: TBD Status is: Inpatient Remains inpatient appropriate because: IV antibiotics, patient and family to decide between home health PT versus home with home hospice    Antimicrobials:  Anti-infectives (From admission, onward)    Start     Dose/Rate Route Frequency Ordered Stop   12/09/23 1800  vancomycin (VANCOCIN) IVPB 1000 mg/200 mL premix       Placed in "Followed by" Linked Group   1,000 mg 200 mL/hr over 60 Minutes Intravenous Every 24 hours 12/09/23 0900     12/09/23 1800  ceFEPIme (MAXIPIME) 2 g in sodium chloride 0.9 % 100 mL IVPB        2 g 200 mL/hr over 30 Minutes Intravenous Every 12 hours 12/09/23 1030     12/07/23 1800  vancomycin (VANCOREADY) IVPB 1250 mg/250 mL  Status:  Discontinued       Placed in "Followed by" Linked Group   1,250 mg  166.7 mL/hr over 90 Minutes Intravenous Every 24 hours 12/06/23 1938 12/09/23 0900   12/06/23 2030  vancomycin (VANCOREADY) IVPB 1500 mg/300 mL       Placed in "Followed by" Linked Group   1,500 mg 150 mL/hr over 120 Minutes Intravenous  Once 12/06/23 1938 12/07/23 0143   12/06/23 2015  ceFEPIme (MAXIPIME) 1 g in sodium chloride 0.9 % 100 mL IVPB  Status:  Discontinued        1 g 200 mL/hr over 30 Minutes Intravenous Every 8 hours 12/06/23 1919  12/09/23 1030        Objective: Vitals:   12/10/23 1949 12/11/23 0419 12/11/23 0807 12/11/23 0930  BP: 128/78 (!) 155/78  136/75  Pulse: 80 70 81 84  Resp: 17 17 18 19   Temp: 98.4 F (36.9 C) 98.3 F (36.8 C)  98.5 F (36.9 C)  TempSrc: Oral Oral  Oral  SpO2: 100% 100% 98% 96%  Weight:      Height:        Intake/Output Summary (Last 24 hours) at 12/11/2023 1212 Last data filed at 12/10/2023 1621 Gross per 24 hour  Intake 191.99 ml  Output 1 ml  Net 190.99 ml   Filed Weights   12/07/23 0611  Weight: 64.7 kg    Examination:  General exam: Appears calm and comfortable  Respiratory system:  Respiratory effort normal. No respiratory distress. No conversational dyspnea.  On 2 L oxygen Central nervous system: Alert  Extremities: Symmetric in appearance  Skin: No rashes, lesions or ulcers on exposed skin   Data Reviewed: I have personally reviewed following labs and imaging studies  CBC: Recent Labs  Lab 12/07/23 0637 12/08/23 0616 12/09/23 0403 12/10/23 0244 12/11/23 0350  WBC 29.5* 25.5* 17.4* 15.4* 15.9*  HGB 9.1* 8.2* 7.5* 7.5* 7.4*  HCT 28.4* 25.7* 23.8* 24.3* 23.7*  MCV 87.4 87.4 88.8 90.3 88.8  PLT 281 282 251 235 212   Basic Metabolic Panel: Recent Labs  Lab 12/07/23 0637 12/08/23 0616 12/09/23 0403 12/10/23 0244 12/11/23 0350  NA 140 138 141 138 143  K 3.5 3.2* 3.6 3.4* 3.8  CL 99 99 102 101 105  CO2 29 28 30 29 31   GLUCOSE 123* 119* 110* 127* 90  BUN 15 17 18 18 16   CREATININE 0.86 0.98 1.00 0.85 0.78  CALCIUM 7.7* 7.7* 7.9* 8.1* 8.5*  MG 1.4* 2.1 2.0 1.8  --    GFR: Estimated Creatinine Clearance: 64.8 mL/min (by C-G formula based on SCr of 0.78 mg/dL). Liver Function Tests: Recent Labs  Lab 12/07/23 0637  AST 12*  ALT 8  ALKPHOS 111  BILITOT 0.8  PROT 5.2*  ALBUMIN 2.1*   No results for input(s): "LIPASE", "AMYLASE" in the last 168 hours. No results for input(s): "AMMONIA" in the last 168 hours. Coagulation Profile: No  results for input(s): "INR", "PROTIME" in the last 168 hours. Cardiac Enzymes: No results for input(s): "CKTOTAL", "CKMB", "CKMBINDEX", "TROPONINI" in the last 168 hours. BNP (last 3 results) No results for input(s): "PROBNP" in the last 8760 hours. HbA1C: No results for input(s): "HGBA1C" in the last 72 hours. CBG: No results for input(s): "GLUCAP" in the last 168 hours. Lipid Profile: No results for input(s): "CHOL", "HDL", "LDLCALC", "TRIG", "CHOLHDL", "LDLDIRECT" in the last 72 hours. Thyroid Function Tests: No results for input(s): "TSH", "T4TOTAL", "FREET4", "T3FREE", "THYROIDAB" in the last 72 hours. Anemia Panel: No results for input(s): "VITAMINB12", "FOLATE", "FERRITIN", "TIBC", "IRON", "RETICCTPCT" in the last 72 hours. Sepsis Labs: Recent  Labs  Lab 12/06/23 0929 12/06/23 2108 12/07/23 0004 12/07/23 0637  PROCALCITON 0.88  --   --   --   LATICACIDVEN  --  4.9* 2.4* 1.2    Recent Results (from the past 240 hours)  Resp panel by RT-PCR (RSV, Flu A&B, Covid) Anterior Nasal Swab     Status: None   Collection Time: 12/06/23  9:15 AM   Specimen: Anterior Nasal Swab  Result Value Ref Range Status   SARS Coronavirus 2 by RT PCR NEGATIVE NEGATIVE Final   Influenza A by PCR NEGATIVE NEGATIVE Final   Influenza B by PCR NEGATIVE NEGATIVE Final    Comment: (NOTE) The Xpert Xpress SARS-CoV-2/FLU/RSV plus assay is intended as an aid in the diagnosis of influenza from Nasopharyngeal swab specimens and should not be used as a sole basis for treatment. Nasal washings and aspirates are unacceptable for Xpert Xpress SARS-CoV-2/FLU/RSV testing.  Fact Sheet for Patients: BloggerCourse.com  Fact Sheet for Healthcare Providers: SeriousBroker.it  This test is not yet approved or cleared by the [place] FDA and has been authorized for detection and/or diagnosis of SARS-CoV-2 by FDA under an Emergency Use Authorization (EUA). This  EUA will remain in effect (meaning this test can be used) for the duration of the COVID-19 declaration under Section 564(b)(1) of the Act, 21 U.S.C. section 360bbb-3(b)(1), unless the authorization is terminated or revoked.     Resp Syncytial Virus by PCR NEGATIVE NEGATIVE Final    Comment: (NOTE) Fact Sheet for Patients: BloggerCourse.com  Fact Sheet for Healthcare Providers: SeriousBroker.it  This test is not yet approved or cleared by the [place] FDA and has been authorized for detection and/or diagnosis of SARS-CoV-2 by FDA under an Emergency Use Authorization (EUA). This EUA will remain in effect (meaning this test can be used) for the duration of the COVID-19 declaration under Section 564(b)(1) of the Act, 21 U.S.C. section 360bbb-3(b)(1), unless the authorization is terminated or revoked.  Performed at [facility], [address], [place], [place] [address]   MRSA Next Gen by PCR, Nasal     Status: Abnormal   Collection Time: 12/06/23  7:32 PM   Specimen: Nasal Mucosa; Nasal Swab  Result Value Ref Range Status   MRSA by PCR Next Gen DETECTED (A) NOT DETECTED Final    Comment: RESULT CALLED TO, READ BACK BY AND VERIFIED WITH: M SHRESTHA 12/07/2023 @ 0517 BY AB (NOTE) The GeneXpert MRSA Assay (FDA approved for NASAL specimens only), is one component of a comprehensive MRSA colonization surveillance program. It is not intended to diagnose MRSA infection nor to guide or monitor treatment for MRSA infections. Test performance is not FDA approved in patients less than 54 years old. Performed at [facility], [address], [place], [place] [address]   Culture, blood (single) w Reflex to ID Panel     Status: None   Collection Time: 12/06/23  9:09 PM   Specimen: BLOOD  Result Value Ref Range Status   Specimen Description BLOOD SITE NOT SPECIFIED  Final   Special Requests   Final    BOTTLES DRAWN AEROBIC  AND ANAEROBIC Blood Culture results may not be optimal due to an inadequate volume of blood received in culture bottles   Culture   Final    NO GROWTH 5 DAYS Performed at [facility], [address], [place], [place] [address]    Report Status 12/11/2023 FINAL  Final      Radiology Studies: No  results found.     Scheduled Meds:  arformoterol  15 mcg Nebulization BID   aspirin  81 mg Oral Daily   atorvastatin  20 mg Oral Daily   budesonide (PULMICORT) nebulizer solution  0.5 mg Nebulization BID   Chlorhexidine Gluconate Cloth  6 each Topical Daily   enoxaparin (LOVENOX) injection  40 mg Subcutaneous Q24H   feeding supplement  237 mL Oral BID BM   guaiFENesin  600 mg Oral BID   levETIRAcetam  750 mg Oral Daily   metoprolol tartrate  25 mg Oral Daily   montelukast  10 mg Oral QHS   mupirocin ointment   Nasal BID   pantoprazole  40 mg Oral Daily   predniSONE  30 mg Oral Q breakfast   sertraline  50 mg Oral Daily   sodium chloride flush  10-40 mL Intracatheter Q12H   sodium chloride flush  3 mL Intravenous Q12H   Continuous Infusions:  ceFEPime (MAXIPIME) IV 2 g (12/11/23 0523)   vancomycin 1,000 mg (12/10/23 1826)     LOS: 4 days   Time spent: 25 minutes   Noralee Stain, DO [facility] 12/11/2023, 12:12 PM   Available via Epic secure chat 7am-7pm After these hours, please refer to coverage provider listed on [email or web address]

## 2023-12-11 NOTE — Progress Notes (Signed)
RT instructed patient on the use of a flutter valve. Patient was able to demonstrate back good technique.

## 2023-12-12 DIAGNOSIS — C3490 Malignant neoplasm of unspecified part of unspecified bronchus or lung: Secondary | ICD-10-CM | POA: Diagnosis not present

## 2023-12-12 DIAGNOSIS — J189 Pneumonia, unspecified organism: Secondary | ICD-10-CM | POA: Diagnosis not present

## 2023-12-12 DIAGNOSIS — F418 Other specified anxiety disorders: Secondary | ICD-10-CM | POA: Diagnosis not present

## 2023-12-12 DIAGNOSIS — J449 Chronic obstructive pulmonary disease, unspecified: Secondary | ICD-10-CM | POA: Diagnosis not present

## 2023-12-12 LAB — BASIC METABOLIC PANEL
Anion gap: 8 (ref 5–15)
BUN: 17 mg/dL (ref 8–23)
CO2: 32 mmol/L (ref 22–32)
Calcium: 8.5 mg/dL — ABNORMAL LOW (ref 8.9–10.3)
Chloride: 102 mmol/L (ref 98–111)
Creatinine, Ser: 0.72 mg/dL (ref 0.44–1.00)
GFR, Estimated: >60 mL/min (ref >60)
Glucose, Bld: 92 mg/dL (ref 70–99)
Potassium: 3.5 mmol/L (ref 3.5–5.1)
Sodium: 142 mmol/L (ref 135–145)

## 2023-12-12 LAB — PREPARE RBC (CROSSMATCH)

## 2023-12-12 LAB — CBC
HCT: 22.6 % — ABNORMAL LOW (ref 36.0–46.0)
Hemoglobin: 7 g/dL — ABNORMAL LOW (ref 12.0–15.0)
MCH: 27.6 pg (ref 26.0–34.0)
MCHC: 31 g/dL (ref 30.0–36.0)
MCV: 89 fL (ref 80.0–100.0)
Platelets: 206 10*3/uL (ref 150–400)
RBC: 2.54 MIL/uL — ABNORMAL LOW (ref 3.87–5.11)
RDW: 14.9 % (ref 11.5–15.5)
WBC: 15.1 10*3/uL — ABNORMAL HIGH (ref 4.0–10.5)
nRBC: 0 % (ref 0.0–0.2)

## 2023-12-12 MED ORDER — SODIUM CHLORIDE 0.9% IV SOLUTION: Freq: Once | INTRAVENOUS | Status: AC

## 2023-12-12 MED ORDER — AMOXICILLIN-POT CLAVULANATE 875-125 MG PO TABS
1.0000 | ORAL_TABLET | Freq: Two times a day (BID) | ORAL | Status: DC
  Administered 2023-12-12 – 2023-12-14 (×5): 1 via ORAL
  Filled 2023-12-12 (×5): qty 1

## 2023-12-12 NOTE — TOC Initial Note (Signed)
Transition of Care [facility]) - Initial/Assessment Note    Patient Details  Name: [name] MRN: [id number] Date of Birth: 06/02/1961  Transition [facility]) CM/SW Contact:    Epifanio Lesches, RN Phone Number: 12/12/2023, 1:24 PM  Clinical Narrative:                 Presents with  CAP. 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  From home with son. Daughter [name] states active with [facility]. However, [facility] informed NCM pt was d/c from their services on 1/23 and stated will be able take back once d/c.  Per daughter , pt will d/c back to home with hospice care once d/c ready.  TOC team following and will assist with needs...  Expected Discharge Plan: Home w Hospice Care Barriers to Discharge: Continued Medical Work up   Patient Goals and CMS Choice            Expected Discharge Plan and Services                                              Prior Living Arrangements/Services                       Activities of Daily Living   ADL Screening (condition at time of admission) Independently performs ADLs?: Yes (appropriate for developmental age) Is the patient deaf or have difficulty hearing?: No Does the patient have difficulty seeing, even when wearing glasses/contacts?: No Does the patient have difficulty concentrating, remembering, or making decisions?: No  Permission Sought/Granted                  Emotional Assessment              Admission diagnosis:  COPD exacerbation (HCC) [J44.1] Acute on chronic respiratory failure with hypoxia (HCC) [J96.21] Acute cough [R05.1] Patient Active Problem List   Diagnosis Date Noted   FTT (failure to thrive) in adult 11/14/2023   QT prolongation 11/14/2023   Generalized body aches 11/13/2023   HCAP (healthcare-associated pneumonia) 11/03/2023   Generalized weakness 10/13/2023    Thrombocytopenia (HCC) 10/13/2023   Malignant neoplasm of right lung (HCC) 10/13/2023   Sepsis (HCC) 10/13/2023   Fever and neutropenia (HCC) 10/12/2023   Goals of care, counseling/discussion 09/27/2023   Acute metabolic encephalopathy 09/17/2023   Cerebral edema (HCC) 08/31/2023   Acute metabolic encephalopathy 08/28/2023   Community acquired pneumonia of right upper lobe of lung 08/28/2023   AKI (acute kidney injury) (HCC) 08/27/2023   Lung cancer metastatic to brain (HCC) 08/08/2023   Port-A-Cath in place 08/07/2023   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) 07/24/2023   Squamous cell lung cancer, 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   Frontal mass of brain 06/26/2023   Mass of upper lobe of right lung 06/26/2023   DNR (do not resuscitate) 06/26/2023   Cirrhosis (HCC) 03/14/2022   Benign neoplasm of ascending colon    Anemia, chronic disease 01/10/2019   Chronic cough 11/26/2017   Depression    Obesity (BMI 30-39.9) 01/10/2017   Dyspnea on exertion 11/09/2016   Chronic respiratory failure with hypoxia and hypercapnia (HCC) 10/29/2016   Depression with anxiety 08/07/2016   Hot flashes 02/01/2016   Hypokalemia  Breast cancer of upper-outer quadrant of right female breast (HCC) 08/04/2015   GERD (gastroesophageal reflux disease) 12/21/2012   Hyperlipidemia 12/21/2012   Solitary pulmonary nodule on lung CT 12/21/2012   SICKLE-CELL TRAIT 04/15/2008   Sarcoidosis (HCC) 01/10/2007   Hypertension 01/10/2007   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  01/10/2007   BACK PAIN, LOW 01/10/2007   PCP:  [name]., FNP Pharmacy:   [facility] [address] ([address]), [place] [address] [place] ([place]) [place] [address] Phone: [phone] Fax: [phone]  [facility] - [place], [place] - [address] [place] [address] Phone: [phone] Fax: [phone]     Social  Drivers of Health (SDOH) Social History: SDOH Screenings   Food Insecurity: No Food Insecurity (12/07/2023)  Housing: Low Risk  (12/07/2023)  Transportation Needs: No Transportation Needs (12/07/2023)  Utilities: Not At Risk (12/07/2023)  Alcohol Screen: Low Risk  (07/23/2023)  Depression (PHQ2-9): Low Risk  (07/23/2023)  Social Connections: Moderately Isolated (12/07/2023)  Tobacco Use: Medium Risk (11/19/2023)   SDOH Interventions:     Readmission Risk Interventions    11/15/2023   12:18 PM 11/15/2023   10:31 AM 10/18/2023   12:35 PM  Readmission Risk Prevention Plan  Transportation Screening Complete Complete Complete  Medication Review Oceanographer) Complete Complete Complete  PCP or Specialist appointment within 3-5 days of discharge Complete Complete Complete  HRI or Home Care Consult Complete Complete Complete  SW Recovery Care/Counseling Consult Complete Complete Complete  Palliative Care Screening Not Applicable Complete Complete  Skilled Nursing Facility Not Applicable Not Applicable Not Applicable

## 2023-12-12 NOTE — Assessment & Plan Note (Addendum)
Patient was placed on bronchodilator therapy with improvement in her symptoms Systemic corticosteroids can be discontinued today.

## 2023-12-12 NOTE — Progress Notes (Signed)
Physical Therapy Treatment Patient Details Name: [name] MRN: [id number] DOB: [birth year] Today's Date: 12/12/2023   History of Present Illness 63 y/o female presenting to ED on 1/23 with chief complaints of SOB, cough, and chest pain. Chest xray with persistent R upper lung air space opacity associated with cavitary lesion. Admitted with severe sepsis due to HCAP. 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 Pt requiring min assist for transfers and ambulating 45 ft with a walker at a CGA level. Further distance limited due to pt reports of RLE pain. SpO2 99% on 2L O2. Will continue to follow acutely.    If plan is discharge home, recommend the following: A little help with walking and/or transfers;Assist for transportation;Help with stairs or ramp for entrance;A little help with bathing/dressing/bathroom;Direct supervision/assist for medications management;Direct supervision/assist for financial management   Can travel by private vehicle        Equipment Recommendations  None recommended by PT    Recommendations for Other Services       Precautions / Restrictions Precautions Precautions: Fall Precaution Comments: 2L baseline, Port Restrictions Weight Bearing Restrictions Per Provider Order: No     Mobility  Bed Mobility Overal bed mobility: Needs Assistance Bed Mobility: Supine to Sit       Sit to supine: Min assist   General bed mobility comments: Assist to scoot hips since pt positioned on farther side of bed. Pt able to bring BLE's off edge of bed and trunk to upright without physical assist; verbal cues for initiation    Transfers Overall transfer level: Needs assistance Equipment used: Rolling walker (2  wheels) Transfers: Sit to/from Stand, Bed to chair/wheelchair/BSC Sit to Stand: Min assist           General transfer comment: MinA to rise from edge of bed    Ambulation/Gait Ambulation/Gait assistance: Contact guard assist Gait Distance (Feet): 45 Feet Assistive device: Rolling walker (2 wheels) Gait Pattern/deviations: Step-through pattern, Decreased weight shift to right Gait velocity: decreased     General Gait Details: Slow pace, CGA for safety, distance limited due to RLE pain   Stairs             Wheelchair Mobility     Tilt Bed    Modified Rankin (Stroke Patients Only)       Balance Overall balance assessment: Needs assistance Sitting-balance support: No upper extremity supported, Feet supported Sitting balance-Leahy Scale: Good     Standing balance support: Bilateral upper extremity supported, No upper extremity supported, During functional activity Standing balance-Leahy Scale: Fair Standing balance comment: relies on RW dynamically                            Cognition Arousal: Lethargic Behavior During Therapy: Flat affect Overall Cognitive Status: No family/caregiver present to determine baseline cognitive functioning                                 General Comments: Reports month is October this morning, sleepy        Exercises      General Comments        Pertinent Vitals/Pain Pain  Assessment Pain Assessment: No/denies pain    Home Living                          Prior Function            PT Goals (current goals can now be found in the care plan section) Acute Rehab PT Goals Potential to Achieve Goals: Fair    Frequency    Min 1X/week      PT Plan      Co-evaluation              AM-PAC PT "6 Clicks" Mobility   Outcome Measure  Help needed turning from your back to your side while in a flat bed without using bedrails?: None Help needed moving from lying on your  back to sitting on the side of a flat bed without using bedrails?: A Little Help needed moving to and from a bed to a chair (including a wheelchair)?: A Little Help needed standing up from a chair using your arms (e.g., wheelchair or bedside chair)?: A Little Help needed to walk in hospital room?: A Little Help needed climbing 3-5 steps with a railing? : A Lot 6 Click Score: 18    End of Session Equipment Utilized During Treatment: Gait belt;Oxygen Activity Tolerance: Patient limited by fatigue Patient left: with call bell/phone within reach;in bed;with bed alarm set Nurse Communication: Other (comment) (port dressing coming off at bottom) PT Visit Diagnosis: Muscle weakness (generalized) (M62.81);Difficulty in walking, not elsewhere classified (R26.2)     Time: 1610-9604 PT Time Calculation (min) (ACUTE ONLY): 18 min  Charges:    $Therapeutic Activity: 8-22 mins PT General Charges $$ ACUTE PT VISIT: 1 Visit                     Lillia Pauls, PT, DPT Acute Rehabilitation Services Office [phone]    Norval Morton 12/12/2023, 9:28 AM

## 2023-12-12 NOTE — Assessment & Plan Note (Addendum)
Patient with metastatic disease, apparently she has not tolerated chemotherapy as outpatient.  Enrolled in hospice.   Continue with keppra to prevent seizures, related to brain mets. Seems to be progressive.   Anemia of malignancy, her hgb is down to 7,0 today, and has been trending down.  Patient will benefit from PRBC transfusion to improve her symptoms.  She agrees to PRBC transfusion.   Check iron panel.

## 2023-12-12 NOTE — Hospital Course (Signed)
[name] was admitted to the hospital with the working diagnosis of community acquired pneumonia   63 yo female with the past medical history of stage IV non small cell lung cancer, with brain and liver metastasis, depression and anxiety who presented with dyspnea and cough. Her symptoms were associated with fever and generalized weakness.  She has not been able to tolerate chemotherapy as outpatient and she has been enrolled with home hospice.  On her initial physical examination her blood pressure was 136/80, HR 87, RR 17 and 02 saturation 100% on supplemental 02 per .  Lungs with right sided rhonchi and rales, heart with S1 and S2 present and regular, abdomen wit no distention, no lower extremity edema.   Na 142, K 3,5 Cl 97, bicarbonate 34 glucose 91 bun 9 cr 0,83 Wbc 24.8 hgb 9,7 plt 256  Sars covid 19 negtive  Influenza A and B negative   CT head with possible acute to subacute infarct in the left basal ganglia, new from 10/20/23.  Brain MRI with foci of signal abnormality within the left  basal ganglia/ insular region and dorsal right pons, new suspected new metastases.  Parenchymal edema persists at sited of some of patient's previously demonstrated metastases.   Chest radiograph with hyperinflation, right upper lobe infiltrate with cavitation. Small bilateral pleural effusions.   EKG 90 bpm, normal axis, normal intervals, sinus rhythm with left atrial enlargement, no significant ST segment or T wave changes.   Patient was placed on IV antibiotic therapy and palliative care was consulted.   01/29 patient with improvement in respiratory status, will return home to complete oral antibiotic therapy.

## 2023-12-12 NOTE — Assessment & Plan Note (Signed)
Right lower lobe pneumonia, possible post obstructive.  She had severe sepsis on admission, now has resolved.  She has been on cefepime and vancomycin for 3 days, will down escalate to Augmentin to complete 8 days total.   He her blood culture is no growth  She has been afebrile. Her wbc is down to 15.1

## 2023-12-12 NOTE — Assessment & Plan Note (Signed)
Continue with sertraline,

## 2023-12-12 NOTE — Assessment & Plan Note (Signed)
Continue with statin therapy.  ?

## 2023-12-12 NOTE — Progress Notes (Signed)
Progress Note   Patient: [name] ZOX:[id number] DOB: January 06, 1961 DOA: 12/06/2023     5 DOS: the patient was seen and examined on 12/12/2023   Brief hospital course: [name] was admitted to the hospital with the working diagnosis of community acquired pneumonia   63 yo female with the past medical history of stage IV non small cell lung cancer, with brain and liver metastasis, depression and anxiety who presented with dyspnea and cough. Her symptoms were associated with fever and generalized weakness.  She has not been able to tolerate chemotherapy as outpatient and she has been enrolled with home hospice.  On her initial physical examination her blood pressure was 136/80, HR 87, RR 17 and 02 saturation 100% on supplemental 02 per [place].  Lungs with right sided rhonchi and rales, heart with S1 and S2 present and regular, abdomen wit no distention, no lower extremity edema.   Na 142, K 3,5 Cl 97, bicarbonate 34 glucose 91 bun 9 cr 0,83 Wbc 24.8 hgb 9,7 plt 256  Sars covid 19 negtive  Influenza A and B negative   CT head with possible acute to subacute infarct in the left basal ganglia, new from 10/20/23.  Brain MRI with foci of signal abnormality within the left  basal ganglia/ insular region and dorsal right pons, new suspected new metastases.  Parenchymal edema persists at sited of some of patient's previously demonstrated metastases.   Chest radiograph with hyperinflation, right upper lobe infiltrate with cavitation. Small bilateral pleural effusions.   EKG 90 bpm, normal axis, normal intervals, sinus rhythm with left atrial enlargement, no significant ST segment or T wave changes.   Patient was placed on IV antibiotic therapy and palliative care was consulted.   01/29 patient with improvement in respiratory status, will return home to complete oral antibiotic therapy.     Assessment and Plan: * HCAP (healthcare-associated pneumonia) Right lower lobe pneumonia, possible post  obstructive.  She had severe sepsis on admission, now has resolved.  She has been on cefepime and vancomycin for 3 days, will down escalate to Augmentin to complete 8 days total.   He her blood culture is no growth  She has been afebrile. Her wbc is down to 15.1   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  Patient was placed on bronchodilator therapy with improvement in her symptoms Systemic corticosteroids.    Lung cancer metastatic to brain [facility]) Patient with metastatic disease, apparently she has not tolerated chemotherapy as outpatient.  Enrolled in hospice.   Continue with keppra to prevent seizures, related to brain mets. Seems to be progressive.   Anemia of malignancy, her hgb is down to 7,0 today, and has been trending down.  Patient will benefit from PRBC transfusion to improve her symptoms.  She agrees to PRBC transfusion.   Check iron panel.   Depression with anxiety Continue with sertraline,   Hyperlipidemia Continue with statin therapy.         Subjective: Patient is feeling better, but continue very weak and deconditioned, no chest pain, continue to have cough, no hemoptysis.   Physical Exam: Vitals:   12/11/23 2034 12/12/23 0403 12/12/23 0740 12/12/23 0748  BP:  (!) 142/76  (!) 145/73  Pulse:  65  82  Resp:  17 16 20   Temp:  97.7 F (36.5 C)  97.6 F (36.4 C)  TempSrc:  Oral  Axillary  SpO2: 96% 100% 100% 100%  Weight:      Height:  Neurology awake and alert ENT with mild pallor Cardiovascular wit S1 and S2 present and regular with no gallops, rubs or murmurs Respiratory with no wheezing or rales, scattered rhonchi Abdomen with no distention  No lower extremity edema  Data Reviewed:    Family Communication: no family at the bedside   Disposition: Status is: Inpatient Remains inpatient appropriate because: PRBC transfusion, possible discharge home tomorrow with home hospice   Planned Discharge Destination:  Home     Author: Coralie Keens, MD 12/12/2023 7:53 AM  For on call review [email or web address].

## 2023-12-13 ENCOUNTER — Ambulatory Visit: Payer: 59 | Admitting: Internal Medicine

## 2023-12-13 DIAGNOSIS — J189 Pneumonia, unspecified organism: Secondary | ICD-10-CM | POA: Diagnosis not present

## 2023-12-13 DIAGNOSIS — J449 Chronic obstructive pulmonary disease, unspecified: Secondary | ICD-10-CM | POA: Diagnosis not present

## 2023-12-13 DIAGNOSIS — R051 Acute cough: Secondary | ICD-10-CM | POA: Diagnosis not present

## 2023-12-13 DIAGNOSIS — J441 Chronic obstructive pulmonary disease with (acute) exacerbation: Secondary | ICD-10-CM | POA: Diagnosis not present

## 2023-12-13 LAB — TYPE AND SCREEN
ABO/RH(D): O POS
Antibody Screen: NEGATIVE
Unit division: 0

## 2023-12-13 LAB — IRON AND TIBC
Iron: 69 ug/dL (ref 28–170)
Saturation Ratios: 58 % — ABNORMAL HIGH (ref 10.4–31.8)
TIBC: 119 ug/dL — ABNORMAL LOW (ref 250–450)
UIBC: 50 ug/dL

## 2023-12-13 LAB — BASIC METABOLIC PANEL
Anion gap: 8 (ref 5–15)
BUN: 15 mg/dL (ref 8–23)
CO2: 30 mmol/L (ref 22–32)
Calcium: 8.4 mg/dL — ABNORMAL LOW (ref 8.9–10.3)
Chloride: 104 mmol/L (ref 98–111)
Creatinine, Ser: 0.72 mg/dL (ref 0.44–1.00)
GFR, Estimated: >60 mL/min (ref >60)
Glucose, Bld: 89 mg/dL (ref 70–99)
Potassium: 3.3 mmol/L — ABNORMAL LOW (ref 3.5–5.1)
Sodium: 142 mmol/L (ref 135–145)

## 2023-12-13 LAB — BPAM RBC
Blood Product Expiration Date: 202502242359
ISSUE DATE / TIME: 202501291506
Unit Type and Rh: 5100

## 2023-12-13 LAB — CBC
HCT: 28.6 % — ABNORMAL LOW (ref 36.0–46.0)
Hemoglobin: 9.4 g/dL — ABNORMAL LOW (ref 12.0–15.0)
MCH: 28.5 pg (ref 26.0–34.0)
MCHC: 32.9 g/dL (ref 30.0–36.0)
MCV: 86.7 fL (ref 80.0–100.0)
Platelets: 191 10*3/uL (ref 150–400)
RBC: 3.3 MIL/uL — ABNORMAL LOW (ref 3.87–5.11)
RDW: 15.5 % (ref 11.5–15.5)
WBC: 18 10*3/uL — ABNORMAL HIGH (ref 4.0–10.5)
nRBC: 0 % (ref 0.0–0.2)

## 2023-12-13 LAB — FERRITIN: Ferritin: 1205 ng/mL — ABNORMAL HIGH (ref 11–307)

## 2023-12-13 LAB — TRANSFERRIN: Transferrin: 82 mg/dL — ABNORMAL LOW (ref 192–382)

## 2023-12-13 MED ORDER — ONDANSETRON HCL 4 MG/2ML IJ SOLN
4.0000 mg | Freq: Once | INTRAMUSCULAR | Status: AC
  Administered 2023-12-13: 4 mg via INTRAVENOUS
  Filled 2023-12-13: qty 2

## 2023-12-13 MED ORDER — POTASSIUM CHLORIDE CRYS ER 20 MEQ PO TBCR
40.0000 meq | EXTENDED_RELEASE_TABLET | Freq: Once | ORAL | Status: DC
  Filled 2023-12-13: qty 2

## 2023-12-13 MED ORDER — LORAZEPAM 2 MG/ML IJ SOLN: 0.5000 mg | Freq: Four times a day (QID) | INTRAMUSCULAR | Status: DC | PRN

## 2023-12-13 MED ORDER — PANTOPRAZOLE SODIUM 40 MG IV SOLR
40.0000 mg | Freq: Two times a day (BID) | INTRAVENOUS | Status: DC
  Administered 2023-12-13 – 2023-12-14 (×2): 40 mg via INTRAVENOUS
  Filled 2023-12-13 (×2): qty 10

## 2023-12-13 MED ORDER — HYDROMORPHONE HCL 1 MG/ML IJ SOLN
0.5000 mg | Freq: Four times a day (QID) | INTRAMUSCULAR | Status: DC | PRN
  Filled 2023-12-13: qty 1

## 2023-12-13 MED ORDER — POTASSIUM CHLORIDE 10 MEQ/100ML IV SOLN
10.0000 meq | INTRAVENOUS | Status: AC
Start: 2023-12-13 — End: 2023-12-13
  Administered 2023-12-13 (×3): 10 meq via INTRAVENOUS
  Filled 2023-12-13 (×3): qty 100

## 2023-12-13 NOTE — Plan of Care (Signed)

## 2023-12-13 NOTE — Progress Notes (Signed)
Mobility Specialist Progress Note:    12/13/23 1400  Mobility  Activity Transferred from bed to chair;Transferred to/from [facility]  Level of Assistance Contact guard assist, steadying assist  Assistive Device Front wheel walker  Distance Ambulated (ft) 10 ft  Activity Response Tolerated well  Mobility Referral Yes  Mobility visit 1 Mobility  Mobility Specialist Start Time (ACUTE ONLY) 1339  Mobility Specialist Stop Time (ACUTE ONLY) 1355  Mobility Specialist Time Calculation (min) (ACUTE ONLY) 16 min   Pt received OOB w/ alarm sounding. Found to have had BM in bed. Transferred to [facility], BM successful. Assisted in peri care. No complaints throughout. Pt left in chair with call bell and all needs met. Chair alarm on.  D'Vante Earlene Plater Mobility Specialist Please contact via Special educational needs teacher or Rehab office at [phone]

## 2023-12-13 NOTE — Progress Notes (Signed)
[facility]                                                                              [name], is a 63 y.o. 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  She has not been able to tolerate chemotherapy as outpatient and has been enrolled with home hospice. COVID-negative, flu AMB negative. CT head with possible acute to subacute infarct in the left basal ganglia, new from 10/20/23.  Brain MRI with foci of signal abnormality within the left  basal ganglia/ insular region and dorsal right pons, new suspected new metastases.  Parenchymal edema persists at sited of some of patient's previously demonstrated metastases.    CXR with hyperinflation, right upper lobe infiltrate with cavitation. Small bilateral pleural effusions.  Patient was placed on IV antibiotic therapy and palliative care was consulted.   Assessment & Plan    Principal Problem: Sepsis, POA, HCAP (healthcare-associated pneumonia) -Chest x-ray showed persistent right upper lung zone airspace opacity with associated cavitary lesion, bilateral trace pleural effusions -On admission met sepsis criteria with tachycardia, borderline BP, leukocytosis, source due to HCAP -Patient was placed on IV vancomycin and cefepime for 3 days, de-escalated to Augmentin to complete 8 days -Blood cultures NTD, afebrile   HCAP (healthcare-associated pneumonia) Right lower lobe pneumonia, possible post obstructive.  She had severe sepsis on admission, now has resolved.  She has been on cefepime and vancomycin for 3 days, will  down escalate to Augmentin to complete 8 days total.  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   GERD, nausea and vomiting -Complaining of burning chest pain, nausea and vomiting today -Placed on IV PPI, Zofran, pain medication -Will continue to follow  Lung cancer metastatic to brain [facility]) Patient with metastatic disease, apparently she has not tolerated chemotherapy as outpatient.  Enrolled in hospice.  -Continue keppra to prevent seizures, related to brain mets.    Anemia of malignancy -Hemoglobin 9.4, improved from 7.0 on 1/29.  Received packed RBC transfusion on 1/29 Iron panel consistent with anemia of chronic disease   Depression with anxiety Continue sertraline   Hyperlipidemia Continue statin    Estimated body mass index is 23.74 kg/m as calculated from the following:   Height as of this encounter: 5\' 5"  (1.651 m).   Weight as of this encounter: 64.7 kg.  Code Status: DNR DVT Prophylaxis:  enoxaparin (LOVENOX) injection 40 mg Start: 12/06/23 2000   Level of Care: Level of care: Med-Surg Family Communication: Updated patient' Disposition Plan:  Remains inpatient appropriate:      Procedures:    Consultants:     Antimicrobials:   Anti-infectives (From admission, onward)    Start     Dose/Rate Route Frequency Ordered Stop   12/12/23 1400  amoxicillin-clavulanate (AUGMENTIN) 875-125 MG per tablet 1 tablet        1 tablet Oral Every 12 hours 12/12/23 1309     12/09/23 1800  vancomycin (VANCOCIN) IVPB 1000 mg/200 mL premix  Status:  Discontinued       Placed in "Followed by" Linked Group   1,000 mg 200 mL/hr over 60 Minutes Intravenous Every 24 hours 12/09/23 0900 12/12/23 1309   12/09/23 1800  ceFEPIme (MAXIPIME) 2 g in sodium chloride 0.9 % 100 mL IVPB  Status:   Discontinued        2 g 200 mL/hr over 30 Minutes Intravenous Every 12 hours 12/09/23 1030 12/12/23 1309   12/07/23 1800  vancomycin (VANCOREADY) IVPB 1250 mg/250 mL  Status:  Discontinued       Placed in "Followed by" Linked Group   1,250 mg 166.7 mL/hr over 90 Minutes Intravenous Every 24 hours 12/06/23 1938 12/09/23 0900   12/06/23 2030  vancomycin (VANCOREADY) IVPB 1500 mg/300 mL       Placed in "Followed by" Linked Group   1,500 mg 150 mL/hr over 120 Minutes Intravenous  Once 12/06/23 1938 12/07/23 0143   12/06/23 2015  ceFEPIme (MAXIPIME) 1 g in sodium chloride 0.9 % 100 mL IVPB  Status:  Discontinued        1 g 200 mL/hr over 30 Minutes Intravenous Every 8 hours 12/06/23 1919 12/09/23 1030          Medications  amoxicillin-clavulanate  1 tablet Oral Q12H   arformoterol  15 mcg Nebulization BID   aspirin  81 mg Oral Daily   atorvastatin  20 mg Oral Daily   budesonide (PULMICORT) nebulizer solution  0.5 mg Nebulization BID   Chlorhexidine Gluconate Cloth  6 each Topical Daily   enoxaparin (LOVENOX) injection  40 mg Subcutaneous Q24H   feeding supplement  237 mL Oral BID BM   guaiFENesin  600 mg Oral BID   levETIRAcetam  750 mg Oral Daily   metoprolol tartrate  25 mg Oral Daily   montelukast  10 mg Oral QHS   mupirocin ointment   Nasal BID   pantoprazole (PROTONIX) IV  40 mg Intravenous Q12H   potassium chloride  40 mEq Oral Once   sertraline  50 mg Oral Daily   sodium chloride flush  10-40 mL Intracatheter Q12H   sodium chloride flush  3 mL Intravenous Q12H      Subjective:   [name] was seen and examined today.  Seen twice, initially no acute complaints however subsequently complaining of nausea vomiting, burning chest pain.  Patient denies dizziness,  abdominal pain, N/V/D/C, new weakness. No acute events overnight.    Objective:   Vitals:   12/12/23 2117 12/13/23 0811 12/13/23 0817 12/13/23 0919  BP: 133/65  133/64 (!) 157/102  Pulse: 70 68 73 (!) 102   Resp: 18 17 17 18   Temp: 97.6 F (36.4 C)  97.9 F (36.6 C)   TempSrc: Oral     SpO2: 100% 100% 100%   Weight:      Height:        Intake/Output Summary (Last 24 hours) at 12/13/2023 1315 Last data filed at 12/12/2023 1835 Gross per 24 hour  Intake 404 ml  Output --  Net 404 ml     Wt Readings from Last 3 Encounters:  12/07/23 64.7 kg  11/27/23 65 kg  11/24/23 69.1 kg     Exam General: Alert and oriented x 3, NAD Cardiovascular: S1 S2 auscultated,  RRR Respiratory: Clear to auscultation bilaterally, no wheezing Gastrointestinal: Soft, nontender, nondistended, + bowel sounds Ext: no pedal edema bilaterally Neuro: no new deficits Psych: Normal affect     Data Reviewed:  I have personally reviewed following labs    CBC Lab Results  Component Value Date   WBC 18.0 (H) 12/13/2023   RBC 3.30 (L) 12/13/2023   HGB 9.4 (L) 12/13/2023   HCT 28.6 (L) 12/13/2023   MCV 86.7 12/13/2023   MCH 28.5 12/13/2023   PLT 191 12/13/2023   MCHC 32.9 12/13/2023   RDW 15.5 12/13/2023   LYMPHSABS 1.0 11/27/2023   MONOABS 1.6 (H) 11/27/2023   EOSABS 0.0 11/27/2023   BASOSABS 0.0 11/27/2023     Last metabolic panel Lab Results  Component Value Date   NA 142 12/13/2023   K 3.3 (L) 12/13/2023   CL 104 12/13/2023   CO2 30 12/13/2023   BUN 15 12/13/2023   CREATININE 0.72 12/13/2023   GLUCOSE 89 12/13/2023   GFRNONAA >60 12/13/2023   GFRAA >60 02/21/2019   CALCIUM 8.4 (L) 12/13/2023   PHOS 3.9 11/15/2023   PROT 5.2 (L) 12/07/2023   ALBUMIN 2.1 (L) 12/07/2023   LABGLOB 3.3 02/05/2017   AGRATIO 1.3 02/05/2017   BILITOT 0.8 12/07/2023   ALKPHOS 111 12/07/2023   AST 12 (L) 12/07/2023   ALT 8 12/07/2023   ANIONGAP 8 12/13/2023    CBG (last 3)  No results for input(s): "GLUCAP" in the last 72 hours.    Coagulation Profile: No results for input(s): "INR", "PROTIME" in the last 168 hours.   Radiology Studies: I have personally reviewed the imaging studies  No  results found.     Thad Ranger M.D. [facility] 12/13/2023, 1:15 PM  Available via Epic secure chat 7am-7pm After 7 pm, please refer to night coverage provider listed on amion.

## 2023-12-14 DIAGNOSIS — J441 Chronic obstructive pulmonary disease with (acute) exacerbation: Secondary | ICD-10-CM | POA: Diagnosis not present

## 2023-12-14 DIAGNOSIS — J189 Pneumonia, unspecified organism: Secondary | ICD-10-CM | POA: Diagnosis not present

## 2023-12-14 DIAGNOSIS — R051 Acute cough: Secondary | ICD-10-CM | POA: Diagnosis not present

## 2023-12-14 DIAGNOSIS — C3490 Malignant neoplasm of unspecified part of unspecified bronchus or lung: Secondary | ICD-10-CM | POA: Diagnosis not present

## 2023-12-14 MED ORDER — GUAIFENESIN ER 600 MG PO TB12: 600.0000 mg | ORAL_TABLET | Freq: Two times a day (BID) | ORAL | 1 refills | Status: DC

## 2023-12-14 MED ORDER — HEPARIN SOD (PORK) LOCK FLUSH 100 UNIT/ML IV SOLN
500.0000 [IU] | INTRAVENOUS | Status: AC | PRN
  Administered 2023-12-14: 500 [IU]

## 2023-12-14 MED ORDER — LEVETIRACETAM 750 MG PO TABS: 750.0000 mg | ORAL_TABLET | Freq: Every day | ORAL | Status: DC

## 2023-12-14 MED ORDER — POTASSIUM CHLORIDE CRYS ER 20 MEQ PO TBCR: 20.0000 meq | EXTENDED_RELEASE_TABLET | Freq: Every day | ORAL | 0 refills | Status: DC

## 2023-12-14 MED ORDER — ALBUTEROL SULFATE HFA 108 (90 BASE) MCG/ACT IN AERS: 2.0000 | INHALATION_SPRAY | Freq: Four times a day (QID) | RESPIRATORY_TRACT | 0 refills | Status: DC | PRN

## 2023-12-14 NOTE — Plan of Care (Signed)
  Problem: Coping: Goal: Level of anxiety will decrease Outcome: Progressing   Problem: Safety: Goal: Ability to remain free from injury will improve Outcome: Progressing   Problem: Skin Integrity: Goal: Risk for impaired skin integrity will decrease Outcome: Progressing   Problem: Pain Managment: Goal: General experience of comfort will improve and/or be controlled Outcome: Adequate for Discharge

## 2023-12-14 NOTE — Discharge Summary (Signed)
Physician Discharge Summary   Patient: [name] MRN: [id number] DOB: [birth year]  Admit date:     12/06/2023  Discharge date: 12/14/23  Discharge Physician: Thad Ranger, MD    PCP: [name]., FNP   Recommendations at discharge:   Patient accepted by hospice.  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  She has not been able to tolerate chemotherapy as outpatient and has been enrolled with home hospice. COVID-negative, flu AMB negative. CT head with possible acute to subacute infarct in the left basal ganglia, new from 10/20/23.  Brain MRI with foci of signal abnormality within the left  basal ganglia/ insular region and dorsal right pons, new suspected new metastases.  Parenchymal edema persists at sited of some of patient's previously demonstrated metastases.    CXR with hyperinflation, right upper lobe infiltrate with cavitation. Small bilateral pleural effusions.  Patient was placed on IV antibiotic therapy and palliative care was consulted.   Assessment and Plan:  Sepsis, POA, HCAP (healthcare-associated pneumonia) -Chest x-ray showed persistent right upper lung zone airspace opacity with associated cavitary lesion, bilateral trace pleural effusions -On admission met sepsis criteria with tachycardia, borderline BP, leukocytosis, source due to HCAP -Patient was placed on IV vancomycin and cefepime, de-escalated to Augmentin to complete 8 days -Blood cultures NTD, afebrile -Patient has completed more than 8 days of antibiotics, does not need any  further antibiotics at this time.    HCAP (healthcare-associated pneumonia) Right lower lobe pneumonia, possible post obstructive.  She had severe sepsis on admission, now has resolved.  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    GERD, nausea and vomiting -Feeling better, no further nausea or vomiting.  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  Enrolled in hospice.  -Continue keppra to prevent seizures, related to brain mets.    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  Received packed RBC transfusion on 1/29 Iron panel consistent with anemia of chronic disease   Depression with anxiety Continue sertraline   Hyperlipidemia Continue statin       Pain control - [place] Controlled Substance Reporting System database was reviewed. and patient was instructed, not to drive, operate heavy machinery, perform activities at heights, swimming or participation in water activities or provide baby-sitting services while on Pain, Sleep and Anxiety Medications; until their outpatient Physician has advised to do so again. Also recommended to not to take more than prescribed Pain, Sleep and Anxiety Medications.  Consultants: Palliative medicine Procedures performed:   Disposition: Home with hospice Diet recommendation:  Discharge Diet Orders (From admission, onward)     Start     Ordered  12/14/23 0000  Diet - low sodium heart healthy         12/14/23 1235            DISCHARGE MEDICATION: Allergies as of 12/14/2023       Reactions   Codeine Other (See Comments)   Avoids-felt bad when taken before Pt claims she can have this but does not like to take        Medication List     STOP taking these medications    triamterene-hydrochlorothiazide 37.5-25 MG tablet Commonly known as: MAXZIDE-25       TAKE these medications    albuterol 108 (90 Base) MCG/ACT inhaler Commonly known as: VENTOLIN HFA Inhale 2 puffs into the lungs every 6 (six) hours as needed for wheezing or shortness of breath. What changed: See the new instructions.   aspirin 81 MG chewable tablet Chew 1 tablet (81 mg total) by mouth daily.   atorvastatin 20 MG tablet Commonly known as: LIPITOR TAKE 1 TABLET (20 MG TOTAL) BY MOUTH DAILY.   baclofen 10 MG tablet Commonly known as: LIORESAL Take 1 tablet (10 mg total) by mouth 3 (three) times daily.   guaiFENesin 600 MG 12 hr tablet Commonly known as: MUCINEX Take 1 tablet (600 mg total) by mouth 2 (two) times daily.   HYDROcodone-acetaminophen 5-325 MG tablet Commonly known as: Norco Take 1 tablet by mouth every 6 (six) hours as needed for moderate pain (pain score 4-6).   levETIRAcetam 750 MG tablet Commonly known as: KEPPRA Take 1 tablet (750 mg total) by mouth daily.   MAGnesium-Oxide 400 (240 Mg) MG tablet Generic drug: magnesium oxide Take 1 tablet by mouth every morning.   metoprolol tartrate 25 MG tablet Commonly known as: LOPRESSOR Take 1 tablet (25 mg total) by mouth daily.   montelukast 10 MG tablet Commonly known as: SINGULAIR Take 10 mg by mouth at bedtime.   multivitamin capsule Take 1 capsule by mouth daily with breakfast.   omeprazole 20 MG capsule Commonly known as: PRILOSEC TAKE 1 CAPSULE EVERY DAY What changed: when to take this   OXYGEN Inhale 2-3 L/min into the lungs See admin instructions. Inhale 2-3 L/min into the lungs CONTINUOUSLY; 2 L/min at  rest and 3 L/min if exerted   potassium chloride SA 20 MEQ tablet Commonly known as: KLOR-CON M Take 1 tablet (20 mEq total) by mouth daily for 14 days.   sertraline 50 MG tablet Commonly known as: ZOLOFT TAKE 1 TABLET EVERY DAY   Stiolto Respimat 2.5-2.5 MCG/ACT Aers Generic drug: Tiotropium Bromide-Olodaterol USE 2 INHALATIONS BY MOUTH DAILY        Follow-up Information     [name]., FNP. Schedule an appointment as soon as possible for a visit in 2 week(s).   Specialty: Family Medicine Why: for hospital follow-up Contact information: [address] [place] [address] [phone]                Discharge Exam: Ceasar Mons Weights   12/07/23 6045  Weight: 64.7 kg   Feeling better today, no nausea vomiting, tolerating diet,  Working with PT this morning.   BP 135/75 (BP Location: Right Arm)   Pulse 85   Temp 98.7 F (37.1 C) (Oral)   Resp 17   Ht 5\' 5"  (1.651 m)   Wt 64.7 kg   SpO2 100%   BMI 23.74 kg/m   Physical Exam General: Alert and oriented x 3, NAD Cardiovascular: S1 S2 clear, RRR.  Respiratory:  CTAB, no wheezing Gastrointestinal: Soft, nontender, nondistended, NBS Ext: no pedal edema bilaterally Neuro: no new deficits Psych: Normal affect    Condition at discharge: Guarded, overall poor prognosis with stage IV malignancy.  The results of significant diagnostics from this hospitalization (including imaging, microbiology, ancillary and laboratory) are listed below for reference.   Imaging Studies: MR BRAIN WO CONTRAST Result Date: 12/06/2023 CLINICAL DATA:  Provided history: Stroke, follow-up. EXAM: MRI HEAD WITHOUT CONTRAST TECHNIQUE: Multiplanar, multiecho pulse sequences of the brain and surrounding structures were obtained without intravenous contrast. COMPARISON:  Prior head CT examinations 12/06/2023 and earlier. Brain MRI 09/17/2023. FINDINGS: Please note, there is limited reassessment of the patient's known intracranial  metastatic disease, and limited assessment for new intracranial metastases, on this non-contrast brain MRI. Brain: No age-advanced or lobar predominant parenchymal atrophy. Area of parenchymal edema centered in the left basal ganglia and insular region, measuring 4.8 x 3.3 cm in transaxial dimensions. This is new from the prior brain MRI of 09/17/2023 and highly suspicious for the presence of a new metastasis at this site. Specifically, there is a suspected new 11 mm partially cystic metastasis centrally within this region of edema (series 5, image 13). 1.4 cm focus of parenchymal edema within the dorsal right pons, new from the prior brain MRI and highly suspicious for the presence of a new metastasis at this site (series 5, image 8) (series 6, image 8). Small region of cortical/subcortical T2 FLAIR hyperintense signal abnormality within the lateral left temporal lobe at site of a known metastasis. Region of cortical/subcortical edema within the left occipital lobe spanning 4 cm, in the region of a known left occipital metastasis. Chronic blood products also present at this site (series 7, image 11). No edema persists at site of a previously demonstrated metastasis within the right parietal lobe (series 6, image 21). Minimal residual T2 FLAIR hyperintense signal abnormality within the lateral left cerebellar hemisphere, at site of a previously demonstrated mass (series 6, image 6). Mild multifocal T2 FLAIR hyperintense signal abnormality elsewhere within the cerebral white matter and pons, nonspecific but compatible with chronic small vessel ischemic disease. Partially empty sella turcica. There is no acute infarct. No extra-axial fluid collection. No midline shift. Vascular: Maintained flow voids within the proximal large arterial vessels. Skull and upper cervical spine: No focal worrisome marrow lesion. Sinuses/Orbits: No mass or acute finding within the imaged orbits. Prior bilateral ocular lens replacement.  Small mucous retention cyst within the left maxillary sinus. Other: Trace fluid within the bilateral mastoid air cells. IMPRESSION: 1. Please note, there is limited reassessment of the patient's known intracranial metastatic disease, and limited assessment for new intracranial metastases, on this non-contrast brain MRI. 2. Foci of signal abnormality within the left basal ganglia/insular region and dorsal right pons, new from the prior brain MRI of 09/17/2023 and suspected to reflect sites of new metastases. Post-contrast MR imaging of the brain recommended for further evaluation. 3. Parenchymal edema persists at sites of some of the patient's previously demonstrated metastases, as described. Post-contrast MR imaging of the brain recommended for further evaluation. 4. Background mild chronic small vessel ischemic disease. Electronically Signed   By: Jackey Loge D.O.   On: 12/06/2023 13:00   CT Head Wo Contrast Result Date: 12/06/2023 CLINICAL DATA:  Delirium EXAM: CT HEAD WITHOUT CONTRAST TECHNIQUE: Contiguous axial images were obtained from the base of the skull through the vertex without intravenous contrast. RADIATION DOSE REDUCTION: This exam was performed according to the departmental dose-optimization program which  includes automated exposure control, adjustment of the mA and/or kV according to patient size and/or use of iterative reconstruction technique. COMPARISON:  Head CT 10/20/2023 FINDINGS: Brain: No hemorrhage. No hydrocephalus. No extra-axial fluid collection. No mass effect. No mass lesion. Possible acute to subacute infarct in the left basal ganglia, new/evolved compared to 10/20/2023. Vascular: No hyperdense vessel or unexpected calcification. Skull: Normal. Negative for fracture or focal lesion. Sinuses/Orbits: No middle ear mastoid effusion. Paranasal sinuses are clear. Bilateral lens replacement. Orbits are otherwise unremarkable. Other: None. IMPRESSION: Possible acute to subacute infarct in  the left basal ganglia, new/evolved compared to 10/20/2023. Consider further evaluation with a brain MRI. Electronically Signed   By: Lorenza Cambridge M.D.   On: 12/06/2023 10:30   DG Chest 2 View Result Date: 12/06/2023 CLINICAL DATA:  SOb  SOB, cough, chest pain EXAM: CHEST - 2 VIEW COMPARISON:  CT angio chest 11/03/2023, CT abdomen pelvis 11/24/2023, chest x-ray 11/13/2023 FINDINGS: Right chest wall Port-A-Cath with tip overlying the expected region of distal superior vena cava. The heart and mediastinal contours are within normal limits. Hyperinflation of the lungs with slightly coarsened interstitial markings. Right upper lobe scarring and architectural distortion with persistent right upper lung zone airspace opacity with associated cavitary lesion. Right middle lobe architectural distortion and scarring again noted. No pulmonary edema. Flattening and blunting of bilateral costophrenic angles with trace pleural effusions. No pneumothorax. No acute osseous abnormality. Right chest wall/axillary surgical clips/vascular clips. IMPRESSION: 1. Persistent right upper lung zone airspace opacity with associated cavitary lesion. 2. Bilateral trace pleural effusions. 3. Aortic Atherosclerosis (ICD10-I70.0) and Emphysema (ICD10-J43.9). Electronically Signed   By: Tish Frederickson M.D.   On: 12/06/2023 08:09   CT ABDOMEN PELVIS W CONTRAST Result Date: 11/24/2023 CLINICAL DATA:  Hematochezia. Lung carcinoma. * Tracking Code: BO * EXAM: CT ABDOMEN AND PELVIS WITH CONTRAST TECHNIQUE: Multidetector CT imaging of the abdomen and pelvis was performed using the standard protocol following bolus administration of intravenous contrast. RADIATION DOSE REDUCTION: This exam was performed according to the departmental dose-optimization program which includes automated exposure control, adjustment of the mA and/or kV according to patient size and/or use of iterative reconstruction technique. CONTRAST:  OMNIPAQUE IOHEXOL 300  MG/ML  SOLN COMPARISON:  11/03/2023 FINDINGS: Lower Chest: No acute findings.  Stable bibasilar scarring. Hepatobiliary: 2 ill-defined low-attenuation lesions in the posterior right hepatic lobe measuring 1.9 cm and 1.1 cm show no significant change since recent study, but remain highly suspicious for liver metastases. No new or enlarging liver lesions identified. Gallbladder is unremarkable. No evidence of biliary ductal dilatation. Pancreas:  No mass or inflammatory changes. Spleen: No No evidence of splenomegaly. 11 mm low-attenuation lesion in the posterior spleen is stable and has nonspecific features. Adrenals/Urinary Tract: No suspicious masses identified. No evidence of ureteral calculi or hydronephrosis. Unremarkable unopacified urinary bladder. Stomach/Bowel: No evidence of obstruction, inflammatory process or abnormal fluid collections. Normal appendix visualized. Mild colonic diverticulosis again seen, without signs of diverticulitis. No mass identified. Vascular/Lymphatic: No pathologically enlarged lymph nodes. No acute vascular findings. Reproductive: Prior hysterectomy noted. Adnexal regions are unremarkable in appearance. Other:  None. Musculoskeletal: No suspicious bone lesions identified. Mild superior endplate compression fracture of the T12 vertebral body is new since previous study. IMPRESSION: New mild compression fracture of the T12 vertebral body. No other acute findings identified. Stable small right hepatic lobe lesions, which remain highly suspicious for liver metastases. Stable 11 mm nonspecific low-attenuation lesion in the spleen. Mild colonic diverticulosis, without radiographic evidence of  diverticulitis. Electronically Signed   By: Danae Orleans M.D.   On: 11/24/2023 17:55   DG Pelvis 1-2 Views Result Date: 11/17/2023 CLINICAL DATA:  Back pain after fall EXAM: PELVIS - 1-2 VIEW COMPARISON:  None Available. FINDINGS: No acute fracture or dislocation. Degenerative changes pubic  symphysis, both hips, SI joints and lower lumbar spine. IMPRESSION: No acute fracture or dislocation. Electronically Signed   By: Minerva Fester M.D.   On: 11/17/2023 19:42   DG Thoracic Spine 2 View Result Date: 11/17/2023 CLINICAL DATA:  Larey Seat today.  Widespread mid back pain EXAM: THORACIC SPINE 2 VIEWS COMPARISON:  Chest CT 10/14/2023 FINDINGS: No evidence of acute fracture or traumatic listhesis. Mild age-related spondylotic changes. Right chest wall Port-A-Cath. IMPRESSION: No evidence of acute fracture or traumatic listhesis. Electronically Signed   By: Minerva Fester M.D.   On: 11/17/2023 19:40   DG Lumbar Spine Complete Result Date: 11/17/2023 CLINICAL DATA:  Pain after fall. EXAM: LUMBAR SPINE - COMPLETE 5 VIEW COMPARISON:  Lumbar spine x-ray 10/20/2023. FINDINGS: Five lumbar-type vertebral bodies. Preserved vertebral body heights. Slight anterolisthesis of L4 on L5. Prominent lower lumbar facet degenerative changes. Minimal scattered endplate osteophytes. Osteopenia. Spondylolysis. Recommend continue precautions until clinical clearance and if there is further concern additional workup with a CT as clinically appropriate for further sensitivity of injury. Scattered vascular calcifications. IMPRESSION: Osteopenia. Mild degenerative changes. Trace anterolisthesis of L4 on L5. Electronically Signed   By: Karen Kays M.D.   On: 11/17/2023 19:38    Microbiology: Results for orders placed or performed during the hospital encounter of 12/06/23  Resp panel by RT-PCR (RSV, Flu A&B, Covid) Anterior Nasal Swab     Status: None   Collection Time: 12/06/23  9:15 AM   Specimen: Anterior Nasal Swab  Result Value Ref Range Status   SARS Coronavirus 2 by RT PCR NEGATIVE NEGATIVE Final   Influenza A by PCR NEGATIVE NEGATIVE Final   Influenza B by PCR NEGATIVE NEGATIVE Final    Comment: (NOTE) The Xpert Xpress SARS-CoV-2/FLU/RSV plus assay is intended as an aid in the diagnosis of influenza from  Nasopharyngeal swab specimens and should not be used as a sole basis for treatment. Nasal washings and aspirates are unacceptable for Xpert Xpress SARS-CoV-2/FLU/RSV testing.  Fact Sheet for Patients: BloggerCourse.com  Fact Sheet for Healthcare Providers: SeriousBroker.it  This test is not yet approved or cleared by the [place] FDA and has been authorized for detection and/or diagnosis of SARS-CoV-2 by FDA under an Emergency Use Authorization (EUA). This EUA will remain in effect (meaning this test can be used) for the duration of the COVID-19 declaration under Section 564(b)(1) of the Act, 21 U.S.C. section 360bbb-3(b)(1), unless the authorization is terminated or revoked.     Resp Syncytial Virus by PCR NEGATIVE NEGATIVE Final    Comment: (NOTE) Fact Sheet for Patients: BloggerCourse.com  Fact Sheet for Healthcare Providers: SeriousBroker.it  This test is not yet approved or cleared by the [place] FDA and has been authorized for detection and/or diagnosis of SARS-CoV-2 by FDA under an Emergency Use Authorization (EUA). This EUA will remain in effect (meaning this test can be used) for the duration of the COVID-19 declaration under Section 564(b)(1) of the Act, 21 U.S.C. section 360bbb-3(b)(1), unless the authorization is terminated or revoked.  Performed at [facility], [address] [place], [place] [address]   MRSA Next Gen by PCR, Nasal     Status: Abnormal   Collection Time: 12/06/23  7:32 PM   Specimen: Nasal Mucosa; Nasal Swab  Result Value Ref Range Status   MRSA by PCR Next Gen DETECTED (A) NOT DETECTED Final    Comment: RESULT CALLED TO, READ BACK BY AND VERIFIED WITH: M SHRESTHA 12/07/2023 @ 0517 BY AB (NOTE) The GeneXpert MRSA Assay (FDA approved for NASAL specimens only), is one component of a comprehensive MRSA colonization  surveillance program. It is not intended to diagnose MRSA infection nor to guide or monitor treatment for MRSA infections. Test performance is not FDA approved in patients less than 26 years old. Performed at [facility], [address] [place], [place] [address]   Culture, blood (single) w Reflex to ID Panel     Status: None   Collection Time: 12/06/23  9:09 PM   Specimen: BLOOD  Result Value Ref Range Status   Specimen Description BLOOD SITE NOT SPECIFIED  Final   Special Requests   Final    BOTTLES DRAWN AEROBIC AND ANAEROBIC Blood Culture results may not be optimal due to an inadequate volume of blood received in culture bottles   Culture   Final    NO GROWTH 5 DAYS Performed at [facility], [address] [place], [place] [address]    Report Status 12/11/2023 FINAL  Final   *Note: Due to a large number of results and/or encounters for the requested time period, some results have not been displayed. A complete set of results can be found in Results Review.    Labs: CBC: Recent Labs  Lab 12/09/23 0403 12/10/23 0244 12/11/23 0350 12/12/23 0340 12/13/23 0101  WBC 17.4* 15.4* 15.9* 15.1* 18.0*  HGB 7.5* 7.5* 7.4* 7.0* 9.4*  HCT 23.8* 24.3* 23.7* 22.6* 28.6*  MCV 88.8 90.3 88.8 89.0 86.7  PLT 251 235 212 206 191   Basic Metabolic Panel: Recent Labs  Lab 12/08/23 0616 12/09/23 0403 12/10/23 0244 12/11/23 0350 12/12/23 0340 12/13/23 0101  NA 138 141 138 143 142 142  K 3.2* 3.6 3.4* 3.8 3.5 3.3*  CL 99 102 101 105 102 104  CO2 28 30 29 31  32 30  GLUCOSE 119* 110* 127* 90 92 89  BUN 17 18 18 16 17 15   CREATININE 0.98 1.00 0.85 0.78 0.72 0.72  CALCIUM 7.7* 7.9* 8.1* 8.5* 8.5* 8.4*  MG 2.1 2.0 1.8  --   --   --    Liver Function Tests: No results for input(s): "AST", "ALT", "ALKPHOS", "BILITOT", "PROT", "ALBUMIN" in the last 168 hours. CBG: No results for input(s): "GLUCAP" in the last 168 hours.  Discharge time spent: greater than 30  minutes.  Signed: Thad Ranger, MD [facility] 12/14/2023

## 2023-12-14 NOTE — Progress Notes (Signed)
Occupational Therapy Treatment Patient Details Name: [name] MRN: [id number] DOB: 29-Jul-1961 Today's Date: 12/14/2023   History of present illness 63 y/o female presenting to ED on 1/23 with chief complaints of SOB, cough, and chest pain. Chest xray with persistent R upper lung air space opacity associated with cavitary lesion. Admitted with severe sepsis due to HCAP. 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  Patient requires min assist for toileting hygiene and min guard for transfers/mobility in room using RW.  Fatigued once reaching recliner.  Continue to recommend HHOT services if able (if going hospice), and [facility] for safety with toileting needs.  Will follow.        If plan is discharge home, recommend the following:  A little help with walking and/or transfers;A little help with bathing/dressing/bathroom;Assistance with cooking/housework;Help with stairs or ramp for entrance;Assist for transportation;Direct supervision/assist for medications management;Direct supervision/assist for financial management   Equipment Recommendations  BSC/3in1    Recommendations for Other Services      Precautions / Restrictions Precautions Precautions: Fall Precaution Comments: 2L baseline, Port Restrictions [employer] Bearing Restrictions Per Provider Order: No       Mobility Bed Mobility               General bed mobility comments: OOB upon entry    Transfers Overall transfer level: Needs assistance Equipment used: Rolling walker (2 wheels) Transfers: Sit to/from Stand Sit to Stand: Contact guard assist           General transfer comment: from EOB, cueing for hand  placement     Balance Overall balance assessment: Needs assistance Sitting-balance support: No upper extremity supported, Feet supported Sitting balance-Leahy Scale: Good     Standing balance support: Bilateral upper extremity supported, Single extremity supported, During functional activity Standing balance-Leahy Scale: Fair Standing balance comment: 1 UE support during ADLs, BUE support dynamically                           ADL either performed or assessed with clinical judgement   ADL Overall ADL's : Needs assistance/impaired     Grooming: Set up;Sitting               Lower Body Dressing: Contact guard assist;Sit to/from stand   Toilet Transfer: Contact guard assist;Ambulation;Rolling walker (2 wheels);BSC/3in1 Toilet Transfer Details (indicate cue type and reason): on BSC upon entry and using RW to exit bathroom Toileting- Clothing Manipulation and Hygiene: Minimal assistance;Sit to/from stand Toileting - Clothing Manipulation Details (indicate cue type and reason): for complete hygiene after +BM     Functional mobility during ADLs: Contact guard assist;Rolling walker (2 wheels)      Extremity/Trunk Assessment              Vision       Perception     Praxis      Cognition Arousal: Alert Behavior During Therapy: Flat affect Overall Cognitive Status: No family/caregiver present to determine baseline cognitive functioning  General Comments: pt with decreased safety awareness, found in bathroom and pt reports not using RW to get there.  unaware of incontinence on recliner.        Exercises      Shoulder Instructions       General Comments 2L O2    Pertinent Vitals/ Pain       Pain Assessment Pain Assessment: Faces Faces Pain Scale: Hurts a little bit Pain Location: R leg Pain Descriptors / Indicators: Discomfort Pain Intervention(s): Monitored during session, Repositioned, Limited  activity within patient's tolerance  Home Living                                          Prior Functioning/Environment              Frequency  Min 1X/week        Progress Toward Goals  OT Goals(current goals can now be found in the care plan section)  Progress towards OT goals: Progressing toward goals  Acute Rehab OT Goals Patient Stated Goal: home OT Goal Formulation: With patient Time For Goal Achievement: 12/24/23 Potential to Achieve Goals: Good  Plan      Co-evaluation                 AM-PAC OT "6 Clicks" Daily Activity     Outcome Measure   Help from another person eating meals?: A Little Help from another person taking care of personal grooming?: A Little Help from another person toileting, which includes using toliet, bedpan, or urinal?: A Little Help from another person bathing (including washing, rinsing, drying)?: A Little Help from another person to put on and taking off regular upper body clothing?: A Little Help from another person to put on and taking off regular lower body clothing?: A Little 6 Click Score: 18    End of Session Equipment Utilized During Treatment: Rolling walker (2 wheels)  OT Visit Diagnosis: Unsteadiness on feet (R26.81);Muscle weakness (generalized) (M62.81);Other symptoms and signs involving cognitive function   Activity Tolerance Patient tolerated treatment well   Patient Left in chair;with call bell/phone within reach;with chair alarm set   Nurse Communication Mobility status        Time: 1610-9604 OT Time Calculation (min): 11 min  Charges: OT General Charges $OT Visit: 1 Visit OT Treatments $Self Care/Home Management : 8-22 mins  Barry Brunner, OT Acute Rehabilitation Services Office [phone]   Chancy Milroy 12/14/2023, 1:12 PM

## 2023-12-14 NOTE — TOC Transition Note (Signed)
Transition of Care [facility]) - Discharge Note   Patient Details  Name: [name] MRN: [id number] Date of Birth: February 28, 1961  Transition of Care [facility]) CM/SW Contact:  Epifanio Lesches, RN Phone Number: [phone] 12/14/2023, 1:47 PM   Clinical Narrative:    Patient will DC to: home Anticipated DC date: 12/14/2023 Family notified: [name], daughter Transport by: car  Per MD patient ready for DC today. RN, patient, patient's family, and  [facility]  [phone]) notified of DC.  [facility] to f/u with pt/ family to arrange  intake/readmission to their services once home today. Daughters ( [name]/[name]) state pt will have 24/7 supervision and assistance once home provided by family. NCM confirmed with pt @ bedside d/c plan is home with the resumption of hospice care with [facility]. Pt agreed.  Daughter to bring portable oxygen tank for ride home. Pt without RX med concerns. Post hospital f/u noted on AVS. Daughter [name] to provide transportation to home.  RNCM will sign off for now as intervention is no longer needed. Please consult [place] again if new needs arise.    Final next level of care: Home w Hospice Care Barriers to Discharge: No Barriers Identified   Patient Goals and CMS Choice            Discharge Placement                       Discharge Plan and Services Additional resources added to the After Visit Summary for                                       Social Drivers of Health (SDOH) Interventions SDOH Screenings   Food Insecurity: No Food Insecurity (12/07/2023)  Housing: Low Risk  (12/07/2023)  Transportation Needs: No Transportation Needs (12/07/2023)  Utilities: Not At Risk (12/07/2023)  Alcohol Screen: Low Risk  (07/23/2023)  Depression (PHQ2-9): Low Risk  (07/23/2023)  Social Connections: Moderately Isolated (12/07/2023)  Tobacco Use: Medium Risk (11/19/2023)     Readmission Risk Interventions    11/15/2023   12:18 PM  11/15/2023   10:31 AM 10/18/2023   12:35 PM  Readmission Risk Prevention Plan  Transportation Screening Complete Complete Complete  Medication Review [employer]) Complete Complete Complete  PCP or Specialist appointment within 3-5 days of discharge Complete Complete Complete  HRI or Home Care Consult Complete Complete Complete  SW Recovery Care/Counseling Consult Complete Complete Complete  Palliative Care Screening Not Applicable Complete Complete  Skilled Nursing Facility Not Applicable Not Applicable Not Applicable

## 2023-12-14 NOTE — Progress Notes (Signed)
Physical Therapy Treatment Patient Details Name: [name] MRN: [id number] DOB: 29-Jan-1961 Today's Date: 12/14/2023   History of Present Illness 63 y/o female presenting to ED on 1/23 with chief complaints of SOB, cough, and chest pain. Chest xray with persistent R upper lung air space opacity associated with cavitary lesion. Admitted with severe sepsis due to HCAP. 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 Pt able to perform ADL's I.e. toileting, washing hands without physical assist. Requires min assist to transition to standing from lower surface; would benefit from [facility] at home. Pt ambulating room distance with a walker at a CGA level on 2L O2. Further distance limited due to pain located along right lateral shin. Will continue to follow acutely.    If plan is discharge home, recommend the following: A little help with walking and/or transfers;Assist for transportation;Help with stairs or ramp for entrance;A little help with bathing/dressing/bathroom;Direct supervision/assist for medications management;Direct supervision/assist for financial management   Can travel by private vehicle        Equipment Recommendations  BSC/3in1    Recommendations for Other Services       Precautions / Restrictions Precautions Precautions: Fall Precaution Comments: 2L baseline, Port Restrictions [employer] Bearing Restrictions Per Provider Order: No     Mobility  Bed Mobility Overal bed mobility: Needs Assistance Bed Mobility: Supine to Sit     Supine to sit: Supervision     General bed mobility comments: Maneuvers to edge of bed with cues for initiation    Transfers Overall transfer level: Needs assistance Equipment used: Rolling walker (2 wheels) Transfers: Sit to/from Stand Sit to  Stand: Min assist           General transfer comment: MinA to rise from low surface of bed and toilet    Ambulation/Gait Ambulation/Gait assistance: Contact guard assist Gait Distance (Feet): 30 Feet Assistive device: Rolling walker (2 wheels) Gait Pattern/deviations: Step-through pattern, Decreased weight shift to right, Decreased dorsiflexion - right, Decreased dorsiflexion - left Gait velocity: decreased Gait velocity interpretation: <1.31 ft/sec, indicative of household ambulator   General Gait Details: Verbal cues for increased foot clearance, but pt unable to carry over. limited by RLE pain   Stairs             Wheelchair Mobility     Tilt Bed    Modified Rankin (Stroke Patients Only)       Balance Overall balance assessment: Needs assistance Sitting-balance support: No upper extremity supported, Feet supported Sitting balance-Leahy Scale: Good     Standing balance support: Bilateral upper extremity supported, No upper extremity supported, During functional activity Standing balance-Leahy Scale: Fair Standing balance comment: relies on RW dynamically                            Cognition Arousal: Alert Behavior During Therapy: Flat affect Overall Cognitive Status: No family/caregiver present to determine baseline cognitive functioning                                 General Comments: Follows 1 step commands        Exercises      General Comments        Pertinent  Vitals/Pain Pain Assessment Pain Assessment: Faces Faces Pain Scale: Hurts a little bit Pain Location: Distal part of R lateral leg Pain Descriptors / Indicators: Discomfort Pain Intervention(s): Monitored during session    Home Living                          Prior Function            PT Goals (current goals can now be found in the care plan section) Acute Rehab PT Goals Potential to Achieve Goals: Fair Progress towards PT goals:  Progressing toward goals    Frequency    Min 1X/week      PT Plan      Co-evaluation              AM-PAC PT "6 Clicks" Mobility   Outcome Measure  Help needed turning from your back to your side while in a flat bed without using bedrails?: None Help needed moving from lying on your back to sitting on the side of a flat bed without using bedrails?: A Little Help needed moving to and from a bed to a chair (including a wheelchair)?: A Little Help needed standing up from a chair using your arms (e.g., wheelchair or bedside chair)?: A Little Help needed to walk in hospital room?: A Little Help needed climbing 3-5 steps with a railing? : A Lot 6 Click Score: 18    End of Session Equipment Utilized During Treatment: Gait belt;Oxygen Activity Tolerance: Patient limited by pain Patient left: with call bell/phone within reach;in bed;with bed alarm set   PT Visit Diagnosis: Muscle weakness (generalized) (M62.81);Difficulty in walking, not elsewhere classified (R26.2)     Time: 8119-1478 PT Time Calculation (min) (ACUTE ONLY): 31 min  Charges:    $Therapeutic Activity: 23-37 mins PT General Charges $$ ACUTE PT VISIT: 1 Visit                     Lillia Pauls, PT, DPT Acute Rehabilitation Services Office [phone]    Norval Morton 12/14/2023, 10:24 AM

## 2023-12-17 ENCOUNTER — Telehealth: Payer: Self-pay

## 2023-12-17 NOTE — Telephone Encounter (Signed)
Confirmed with pt's daughter, [name], that the pt has decided to go on Hospice.  All future appointments have been cancelled.

## 2023-12-18 ENCOUNTER — Ambulatory Visit: Payer: 59 | Admitting: Physician Assistant

## 2023-12-18 ENCOUNTER — Ambulatory Visit: Payer: 59

## 2023-12-18 ENCOUNTER — Other Ambulatory Visit: Payer: 59

## 2023-12-26 ENCOUNTER — Emergency Department (HOSPITAL_COMMUNITY)

## 2023-12-26 ENCOUNTER — Encounter (HOSPITAL_COMMUNITY): Payer: Self-pay

## 2023-12-26 ENCOUNTER — Emergency Department (HOSPITAL_COMMUNITY)
Admission: EM | Admit: 2023-12-26 | Discharge: 2023-12-27 | Disposition: A | Discharge status: Home / Self Care | Attending: Emergency Medicine | Admitting: Emergency Medicine

## 2023-12-26 DIAGNOSIS — R29810 Facial weakness: Secondary | ICD-10-CM

## 2023-12-26 DIAGNOSIS — E876 Hypokalemia: Secondary | ICD-10-CM | POA: Insufficient documentation

## 2023-12-26 DIAGNOSIS — C719 Malignant neoplasm of brain, unspecified: Secondary | ICD-10-CM | POA: Diagnosis not present

## 2023-12-26 DIAGNOSIS — R197 Diarrhea, unspecified: Secondary | ICD-10-CM | POA: Insufficient documentation

## 2023-12-26 DIAGNOSIS — Z7982 Long term (current) use of aspirin: Secondary | ICD-10-CM | POA: Diagnosis not present

## 2023-12-26 DIAGNOSIS — Z79899 Other long term (current) drug therapy: Secondary | ICD-10-CM | POA: Insufficient documentation

## 2023-12-26 DIAGNOSIS — C7931 Secondary malignant neoplasm of brain: Secondary | ICD-10-CM

## 2023-12-26 LAB — COMPREHENSIVE METABOLIC PANEL
ALT: 17 U/L (ref 0–44)
AST: 17 U/L (ref 15–41)
Albumin: 2.6 g/dL — ABNORMAL LOW (ref 3.5–5.0)
Alkaline Phosphatase: 86 U/L (ref 38–126)
Anion gap: 13 (ref 5–15)
BUN: 8 mg/dL (ref 8–23)
CO2: 41 mmol/L — ABNORMAL HIGH (ref 22–32)
Calcium: 8.2 mg/dL — ABNORMAL LOW (ref 8.9–10.3)
Chloride: 88 mmol/L — ABNORMAL LOW (ref 98–111)
Creatinine, Ser: 0.82 mg/dL (ref 0.44–1.00)
GFR, Estimated: >60 mL/min (ref >60)
Glucose, Bld: 77 mg/dL (ref 70–99)
Potassium: 2.9 mmol/L — ABNORMAL LOW (ref 3.5–5.1)
Sodium: 142 mmol/L (ref 135–145)
Total Bilirubin: 1.5 mg/dL — ABNORMAL HIGH (ref 0.0–1.2)
Total Protein: 5.9 g/dL — ABNORMAL LOW (ref 6.5–8.1)

## 2023-12-26 LAB — MAGNESIUM: Magnesium: 1.1 mg/dL — ABNORMAL LOW (ref 1.7–2.4)

## 2023-12-26 LAB — CBC WITH DIFFERENTIAL/PLATELET
Abs Immature Granulocytes: 0.1 10*3/uL — ABNORMAL HIGH (ref 0.00–0.07)
Basophils Absolute: 0 10*3/uL (ref 0.0–0.1)
Basophils Relative: 0 %
Eosinophils Absolute: 0.3 10*3/uL (ref 0.0–0.5)
Eosinophils Relative: 2 %
HCT: 32.1 % — ABNORMAL LOW (ref 36.0–46.0)
Hemoglobin: 10.3 g/dL — ABNORMAL LOW (ref 12.0–15.0)
Immature Granulocytes: 1 %
Lymphocytes Relative: 3 %
Lymphs Abs: 0.4 10*3/uL — ABNORMAL LOW (ref 0.7–4.0)
MCH: 28.5 pg (ref 26.0–34.0)
MCHC: 32.1 g/dL (ref 30.0–36.0)
MCV: 88.9 fL (ref 80.0–100.0)
Monocytes Absolute: 0.8 10*3/uL (ref 0.1–1.0)
Monocytes Relative: 5 %
Neutro Abs: 13.3 10*3/uL — ABNORMAL HIGH (ref 1.7–7.7)
Neutrophils Relative %: 89 %
Platelets: 137 10*3/uL — ABNORMAL LOW (ref 150–400)
RBC: 3.61 MIL/uL — ABNORMAL LOW (ref 3.87–5.11)
RDW: 14.6 % (ref 11.5–15.5)
WBC: 14.9 10*3/uL — ABNORMAL HIGH (ref 4.0–10.5)
nRBC: 0 % (ref 0.0–0.2)

## 2023-12-26 MED ORDER — POTASSIUM CHLORIDE 10 MEQ/100ML IV SOLN
10.0000 meq | INTRAVENOUS | Status: AC
  Administered 2023-12-26 (×2): 10 meq via INTRAVENOUS
  Filled 2023-12-26 (×2): qty 100

## 2023-12-26 MED ORDER — DEXAMETHASONE SODIUM PHOSPHATE 10 MG/ML IJ SOLN
10.0000 mg | INTRAMUSCULAR | Status: AC
  Administered 2023-12-26: 10 mg via INTRAVENOUS
  Filled 2023-12-26: qty 1

## 2023-12-26 MED ORDER — POTASSIUM CHLORIDE CRYS ER 20 MEQ PO TBCR: 20.0000 meq | EXTENDED_RELEASE_TABLET | Freq: Two times a day (BID) | ORAL | 0 refills | Status: AC

## 2023-12-26 MED ORDER — MAGNESIUM SULFATE 2 GM/50ML IV SOLN
2.0000 g | Freq: Once | INTRAVENOUS | Status: AC
  Administered 2023-12-26: 2 g via INTRAVENOUS
  Filled 2023-12-26: qty 50

## 2023-12-26 MED ORDER — ACETAMINOPHEN 500 MG PO TABS
1000.0000 mg | ORAL_TABLET | ORAL | Status: AC
  Administered 2023-12-26: 1000 mg via ORAL
  Filled 2023-12-26: qty 2

## 2023-12-26 MED ORDER — MAGNESIUM-OXIDE 400 (240 MG) MG PO TABS: 1.0000 | ORAL_TABLET | Freq: Every morning | ORAL | 0 refills | Status: DC

## 2023-12-26 MED ORDER — POTASSIUM CHLORIDE CRYS ER 20 MEQ PO TBCR
40.0000 meq | EXTENDED_RELEASE_TABLET | Freq: Once | ORAL | Status: AC
  Administered 2023-12-26: 40 meq via ORAL
  Filled 2023-12-26: qty 2

## 2023-12-26 NOTE — ED Notes (Signed)
Dr. Eloise Harman at bedside to consult family

## 2023-12-26 NOTE — ED Provider Notes (Signed)
[place] EMERGENCY DEPARTMENT AT [facility] Provider Note   CSN: [id number] Arrival date & time: 12/26/23  1910     History {Add pertinent medical, surgical, social history, OB history to HPI:1} Chief Complaint  Patient presents with   Weakness    [name] is a 63 y.o. female.  63 year old female with metastatic lung cancer to the brain and liver who presents emergency department with generalized weakness, right-sided facial droop, and slurred speech since Monday.  Per daughter [name], the facial droop seems to be worsening. Hospice was not informed before her mother was brought to the hospital. Goal is for her to be comfortable. Currently lives at home with her son [name]. Has hospice that comes to the house. Sister [name] is here in the waiting area.  Patient also reports having some diarrhea and abdominal cramping.  Says that she would like to be made comfortable.       Home Medications Prior to Admission medications   Medication Sig Start Date End Date Taking? Authorizing Provider  albuterol (VENTOLIN HFA) 108 (90 Base) MCG/ACT inhaler Inhale 2 puffs into the lungs every 6 (six) hours as needed for wheezing or shortness of breath. 12/14/23   Rai, Delene Ruffini, MD  aspirin 81 MG chewable tablet Chew 1 tablet (81 mg total) by mouth daily. 09/22/23   Swayze, Ava, DO  atorvastatin (LIPITOR) 20 MG tablet TAKE 1 TABLET (20 MG TOTAL) BY MOUTH DAILY. 03/28/21   Sandre Kitty, MD  baclofen (LIORESAL) 10 MG tablet Take 1 tablet (10 mg total) by mouth 3 (three) times daily. Patient not taking: Reported on 12/06/2023 11/19/23   Pickenpack-Cousar, Arty Baumgartner, NP  guaiFENesin (MUCINEX) 600 MG 12 hr tablet Take 1 tablet (600 mg total) by mouth 2 (two) times daily. 12/14/23   Rai, Delene Ruffini, MD  HYDROcodone-acetaminophen (NORCO) 5-325 MG tablet Take 1 tablet by mouth every 6 (six) hours as needed for moderate pain (pain score 4-6). 11/19/23   Pickenpack-Cousar, Arty Baumgartner, NP   levETIRAcetam (KEPPRA) 750 MG tablet Take 1 tablet (750 mg total) by mouth daily. 12/14/23   Rai, Ripudeep K, MD  MAGNESIUM-OXIDE 400 (240 Mg) MG tablet Take 1 tablet by mouth every morning. 10/19/23   [provider]  metoprolol tartrate (LOPRESSOR) 25 MG tablet Take 1 tablet (25 mg total) by mouth daily. 07/09/23   Leslye Peer, MD  montelukast (SINGULAIR) 10 MG tablet Take 10 mg by mouth at bedtime. 07/31/22   [provider]  Multiple Vitamin (MULTIVITAMIN) capsule Take 1 capsule by mouth daily with breakfast.    [provider]  omeprazole (PRILOSEC) 20 MG capsule TAKE 1 CAPSULE EVERY DAY Patient taking differently: Take 20 mg by mouth daily before breakfast. 01/03/21   Sandre Kitty, MD  OXYGEN Inhale 2-3 L/min into the lungs See admin instructions. Inhale 2-3 L/min into the lungs CONTINUOUSLY; 2 L/min at rest and 3 L/min if exerted    [provider]  potassium chloride SA (KLOR-CON M) 20 MEQ tablet Take 1 tablet (20 mEq total) by mouth daily for 14 days. 12/14/23 12/28/23  Cathren Harsh, MD  sertraline (ZOLOFT) 50 MG tablet TAKE 1 TABLET EVERY DAY 09/11/20   Sandre Kitty, MD  STIOLTO RESPIMAT 2.5-2.5 MCG/ACT AERS USE 2 INHALATIONS BY MOUTH DAILY 09/17/23   Nyoka Cowden, MD      Allergies    Codeine    Review of Systems   Review of Systems  Physical Exam Updated  Vital Signs BP (!) 147/109 (BP Location: Right Arm)   Pulse 99   Temp (!) 96.5 F (35.8 C) (Temporal)   Resp 15   SpO2 100%  Physical Exam Vitals and nursing note reviewed.  Constitutional:      General: She is not in acute distress.    Appearance: She is well-developed.  HENT:     Head: Normocephalic and atraumatic.     Right Ear: External ear normal.     Left Ear: External ear normal.     Nose: Nose normal.  Eyes:     Extraocular Movements: Extraocular movements intact.     Conjunctiva/sclera: Conjunctivae normal.     Pupils: Pupils are equal, round, and reactive to  light.     Comments: Pupils 5 mm bilaterally  Cardiovascular:     Rate and Rhythm: Normal rate and regular rhythm.     Heart sounds: No murmur heard. Pulmonary:     Effort: Pulmonary effort is normal. No respiratory distress.     Breath sounds: Normal breath sounds.  Abdominal:     General: Abdomen is flat. There is no distension.     Palpations: Abdomen is soft. There is no mass.     Tenderness: There is no abdominal tenderness. There is no guarding.  Musculoskeletal:     Cervical back: Normal range of motion and neck supple.     Right lower leg: No edema.     Left lower leg: No edema.  Skin:    General: Skin is warm and dry.  Neurological:     Mental Status: She is alert and oriented to person, place, and time.     Comments: Right-sided facial droop involving the forehead.  Right lower extremity weakness.  Cranial nerves II through XII otherwise intact.  Bilateral upper extremity and left lower extremity with full strength.  Intact sensation to light touch in all extremities.  Psychiatric:        Mood and Affect: Mood normal.     ED Results / Procedures / Treatments   Labs (all labs ordered are listed, but only abnormal results are displayed) Labs Reviewed - No data to display  EKG None  Radiology No results found.  Procedures Procedures  {Document cardiac monitor, telemetry assessment procedure when appropriate:1}  Medications Ordered in ED Medications - No data to display  ED Course/ Medical Decision Making/ A&P   {   Click here for ABCD2, HEART and other calculatorsREFRESH Note before signing :1}                              Medical Decision Making Amount and/or Complexity of Data Reviewed Labs: ordered. Radiology: ordered.   ***  {Document critical care time when appropriate:1} {Document review of labs and clinical decision tools ie heart score, Chads2Vasc2 etc:1}  {Document your independent review of radiology images, and any outside  records:1} {Document your discussion with family members, caretakers, and with consultants:1} {Document social determinants of health affecting pt's care:1} {Document your decision making why or why not admission, treatments were needed:1} Final Clinical Impression(s) / ED Diagnoses Final diagnoses:  None    Rx / DC Orders ED Discharge Orders     None

## 2023-12-26 NOTE — ED Notes (Signed)
Patient transported to CT

## 2023-12-26 NOTE — Discharge Instructions (Signed)
You were seen for your facial droop in the emergency department.  Your CT scan showed that the metastases to your brain are growing which is likely causing this to occur.  At home, please continue taking the magnesium and potassium supplementation.    Check your MyChart online for the results of any tests that had not resulted by the time you left the emergency department.   Follow-up with your primary doctor in 2-3 days regarding your visit.  Follow-up with your hospice team tomorrow.  Thank you for visiting our Emergency Department. It was a pleasure taking care of you today.

## 2023-12-26 NOTE — ED Notes (Signed)
[facility] called for transport, pt with weakness and requires oxygen for transport

## 2023-12-26 NOTE — ED Notes (Signed)
Pt's family given saltine crackers, graham crackers, sprite, and peanut butter. Advised the provider was notified that family is back at bedside so he may come speak with them regarding pt, pt family thankful.

## 2023-12-26 NOTE — ED Triage Notes (Signed)
Pt arrived from home BIB [facility] for increased weakness since Monday, pt has had left sided facial droop and slurred speech since Monday as well. Recent recurrent falls, denies being recently sick or fevers. EMS reports pt is at baseline according to family.   Pt is on hospice, has lung cancer that has metastases to the brain.   EMS vitals 175/91 100% on 2L [place] (baseline) 17 RR 105 HR 123 CBG

## 2024-01-08 ENCOUNTER — Ambulatory Visit: Payer: 59 | Admitting: Internal Medicine

## 2024-01-08 ENCOUNTER — Ambulatory Visit: Payer: 59

## 2024-01-08 ENCOUNTER — Other Ambulatory Visit: Payer: 59

## 2024-01-12 DEATH — deceased

## 2024-01-15 ENCOUNTER — Encounter: Payer: Self-pay | Admitting: Hematology and Oncology

## 2024-01-15 ENCOUNTER — Encounter: Payer: Self-pay | Admitting: Internal Medicine

## 2024-01-29 ENCOUNTER — Other Ambulatory Visit: Payer: 59

## 2024-01-29 ENCOUNTER — Ambulatory Visit: Payer: 59 | Admitting: Internal Medicine

## 2024-01-29 ENCOUNTER — Ambulatory Visit: Payer: 59

## 2024-01-31 ENCOUNTER — Encounter: Payer: Self-pay | Admitting: Internal Medicine

## 2024-01-31 ENCOUNTER — Encounter: Payer: Self-pay | Admitting: Hematology and Oncology

## 2024-02-19 ENCOUNTER — Other Ambulatory Visit: Payer: 59

## 2024-02-19 ENCOUNTER — Ambulatory Visit: Payer: 59

## 2024-02-19 ENCOUNTER — Ambulatory Visit: Payer: 59 | Admitting: Internal Medicine
